# Patient Record
Sex: Female | Born: 1940 | Race: Black or African American | Hispanic: No | Smoking: Never smoker
Health system: Southern US, Community
[De-identification: ages and names within clinical notes are randomized; demographics above are authoritative.]

## PROBLEM LIST (undated history)

## (undated) DIAGNOSIS — Z9289 Personal history of other medical treatment: Secondary | ICD-10-CM

## (undated) DIAGNOSIS — M1990 Unspecified osteoarthritis, unspecified site: Secondary | ICD-10-CM

## (undated) DIAGNOSIS — K5792 Diverticulitis of intestine, part unspecified, without perforation or abscess without bleeding: Secondary | ICD-10-CM

## (undated) DIAGNOSIS — K922 Gastrointestinal hemorrhage, unspecified: Secondary | ICD-10-CM

## (undated) DIAGNOSIS — N289 Disorder of kidney and ureter, unspecified: Secondary | ICD-10-CM

## (undated) DIAGNOSIS — E119 Type 2 diabetes mellitus without complications: Secondary | ICD-10-CM

## (undated) DIAGNOSIS — B019 Varicella without complication: Secondary | ICD-10-CM

## (undated) DIAGNOSIS — I10 Essential (primary) hypertension: Secondary | ICD-10-CM

## (undated) DIAGNOSIS — E7800 Pure hypercholesterolemia, unspecified: Secondary | ICD-10-CM

## (undated) HISTORY — PX: EYE SURGERY: SHX253

## (undated) HISTORY — DX: Essential (primary) hypertension: I10

## (undated) HISTORY — DX: Pure hypercholesterolemia, unspecified: E78.00

## (undated) HISTORY — DX: Varicella without complication: B01.9

## (undated) HISTORY — PX: APPENDECTOMY: SHX54

## (undated) HISTORY — DX: Unspecified osteoarthritis, unspecified site: M19.90

## (undated) HISTORY — PX: THYROIDECTOMY, PARTIAL: SHX18

## (undated) HISTORY — DX: Personal history of other medical treatment: Z92.89

## (undated) HISTORY — PX: CHOLECYSTECTOMY: SHX55

## (undated) HISTORY — DX: Diverticulitis of intestine, part unspecified, without perforation or abscess without bleeding: K57.92

## (undated) HISTORY — PX: OTHER SURGICAL HISTORY: SHX169

## (undated) HISTORY — PX: ECTOPIC PREGNANCY SURGERY: SHX613

## (undated) HISTORY — DX: Type 2 diabetes mellitus without complications: E11.9

---

## 2013-09-28 ENCOUNTER — Ambulatory Visit (INDEPENDENT_AMBULATORY_CARE_PROVIDER_SITE_OTHER): Payer: Medicare Other | Admitting: Podiatry

## 2013-09-28 ENCOUNTER — Ambulatory Visit (INDEPENDENT_AMBULATORY_CARE_PROVIDER_SITE_OTHER): Payer: Medicare Other

## 2013-09-28 ENCOUNTER — Encounter: Payer: Self-pay | Admitting: Podiatry

## 2013-09-28 VITALS — BP 151/60 | HR 92 | Resp 18 | Ht 64.0 in | Wt 270.0 lb

## 2013-09-28 DIAGNOSIS — E1149 Type 2 diabetes mellitus with other diabetic neurological complication: Secondary | ICD-10-CM

## 2013-09-28 DIAGNOSIS — M79671 Pain in right foot: Secondary | ICD-10-CM

## 2013-09-28 DIAGNOSIS — M79609 Pain in unspecified limb: Secondary | ICD-10-CM

## 2013-09-28 DIAGNOSIS — B351 Tinea unguium: Secondary | ICD-10-CM

## 2013-09-28 DIAGNOSIS — M79672 Pain in left foot: Principal | ICD-10-CM

## 2013-09-28 DIAGNOSIS — Q6650 Congenital pes planus, unspecified foot: Secondary | ICD-10-CM

## 2013-09-28 NOTE — Patient Instructions (Signed)
Diabetes and Foot Care Diabetes may cause you to have problems because of poor blood supply (circulation) to your feet and legs. This may cause the skin on your feet to become thinner, break easier, and heal more slowly. Your skin may become dry, and the skin may peel and crack. You may also have nerve damage in your legs and feet causing decreased feeling in them. You may not notice minor injuries to your feet that could lead to infections or more serious problems. Taking care of your feet is one of the most important things you can do for yourself.  HOME CARE INSTRUCTIONS  Wear shoes at all times, even in the house. Do not go barefoot. Bare feet are easily injured.  Check your feet daily for blisters, cuts, and redness. If you cannot see the bottom of your feet, use a mirror or ask someone for help.  Wash your feet with warm water (do not use hot water) and mild soap. Then pat your feet and the areas between your toes until they are completely dry. Do not soak your feet as this can dry your skin.  Apply a moisturizing lotion or petroleum jelly (that does not contain alcohol and is unscented) to the skin on your feet and to dry, brittle toenails. Do not apply lotion between your toes.  Trim your toenails straight across. Do not dig under them or around the cuticle. File the edges of your nails with an emery board or nail file.  Do not cut corns or calluses or try to remove them with medicine.  Wear clean socks or stockings every day. Make sure they are not too tight. Do not wear knee-high stockings since they may decrease blood flow to your legs.  Wear shoes that fit properly and have enough cushioning. To break in new shoes, wear them for just a few hours a day. This prevents you from injuring your feet. Always look in your shoes before you put them on to be sure there are no objects inside.  Do not cross your legs. This may decrease the blood flow to your feet.  If you find a minor scrape,  cut, or break in the skin on your feet, keep it and the skin around it clean and dry. These areas may be cleansed with mild soap and water. Do not cleanse the area with peroxide, alcohol, or iodine.  When you remove an adhesive bandage, be sure not to damage the skin around it.  If you have a wound, look at it several times a day to make sure it is healing.  Do not use heating pads or hot water bottles. They may burn your skin. If you have lost feeling in your feet or legs, you may not know it is happening until it is too late.  Make sure your health care provider performs a complete foot exam at least annually or more often if you have foot problems. Report any cuts, sores, or bruises to your health care provider immediately. SEEK MEDICAL CARE IF:   You have an injury that is not healing.  You have cuts or breaks in the skin.  You have an ingrown nail.  You notice redness on your legs or feet.  You feel burning or tingling in your legs or feet.  You have pain or cramps in your legs and feet.  Your legs or feet are numb.  Your feet always feel cold. SEEK IMMEDIATE MEDICAL CARE IF:   There is increasing redness,   swelling, or pain in or around a wound.  There is a red line that goes up your leg.  Pus is coming from a wound.  You develop a fever or as directed by your health care provider.  You notice a bad smell coming from an ulcer or wound. Document Released: 06/01/2000 Document Revised: 02/04/2013 Document Reviewed: 11/11/2012 Exit[employer] Patient Information 2014 [employer].  

## 2013-09-28 NOTE — Progress Notes (Signed)
   Subjective:    Patient ID: Esmerelda Patin, female    DOB: [birth year], 73 y.o.   MRN: [id number]  HPI Comments: I moved here from [place] and i normally get my feet checked every three months.  I am diabetic, its been over 23 years. Last A1c was 9. Long thick toenails   Diabetes      Review of Systems  Endocrine:       Diabetes   All other systems reviewed and are negative.      Objective:   Physical Exam: I have reviewed her past medical history medications allergies surgeries social history and review of systems. Pulses are strongly palpable bilateral neurologic sensorium is decreased to the level midfoot per Semmes-Weinstein monofilament. Deep tendon reflexes are in non-elicitable. Muscle strength is 5 over 5 dorsiflexors plantar flexors inverters everters all intrinsic musculature is intact. Orthopedic evaluation demonstrates mild hammertoe deformity deformities bilateral but asymptomatic. Cutaneous evaluation Mr. Is supple while hydrated cutis thickening of her toenails possible onychomycosis mildly tender on palpation and debridement. Nails are thick yellow dystrophic onychomycotic painful palpation sharply incurvated.       Assessment & Plan:  Diabetes mellitus with peripheral neuropathy manifestations. Pain in limb secondary to onychomycosis.   Plan followup with her as necessary for debridement. Debridement of nails 1 through 5 bilateral covered service secondary to pain

## 2014-01-18 ENCOUNTER — Ambulatory Visit: Payer: Medicare Other | Admitting: Podiatry

## 2014-02-09 ENCOUNTER — Ambulatory Visit: Payer: Self-pay | Admitting: Family Medicine

## 2014-02-10 ENCOUNTER — Ambulatory Visit (INDEPENDENT_AMBULATORY_CARE_PROVIDER_SITE_OTHER): Payer: Medicare Other | Admitting: Podiatry

## 2014-02-10 DIAGNOSIS — M79609 Pain in unspecified limb: Secondary | ICD-10-CM

## 2014-02-10 DIAGNOSIS — B351 Tinea unguium: Secondary | ICD-10-CM

## 2014-02-10 DIAGNOSIS — M79676 Pain in unspecified toe(s): Secondary | ICD-10-CM

## 2014-02-10 NOTE — Progress Notes (Signed)
He presents today chief complaint of painful elongated toenails.  Objective: Nails are thick yellow dystrophic with mycotic pulses are palpable bilateral.  Assessment: Pain in limb secondary to onychomycosis 1 through 5 bilateral.  Plan: Debridement of nails 1 through 5 bilateral covered service secondary to pain.

## 2014-02-12 ENCOUNTER — Ambulatory Visit: Payer: Self-pay | Admitting: Family Medicine

## 2014-03-11 LAB — BASIC METABOLIC PANEL
BUN: 14 mg/dL (ref 4–21)
Creatinine: 1.1 mg/dL (ref 0.5–1.1)
GLUCOSE: 183 mg/dL
POTASSIUM: 4.5 mmol/L (ref 3.4–5.3)
Sodium: 140 mmol/L (ref 137–147)

## 2014-03-11 LAB — LIPID PANEL
Cholesterol: 165 mg/dL (ref 0–200)
HDL: 53 mg/dL (ref 35–70)
LDL Cholesterol: 76 mg/dL
Triglycerides: 182 mg/dL — AB (ref 40–160)

## 2014-03-11 LAB — HEPATIC FUNCTION PANEL
ALT: 36 U/L — AB (ref 7–35)
AST: 30 U/L (ref 13–35)
BILIRUBIN, TOTAL: 0.7 mg/dL

## 2014-03-11 LAB — HEMOGLOBIN A1C: Hgb A1c MFr Bld: 9.5 % — AB (ref 4.0–6.0)

## 2014-03-11 LAB — TSH: TSH: 2.74 u[IU]/mL (ref 0.41–5.90)

## 2014-03-25 ENCOUNTER — Encounter: Payer: Self-pay | Admitting: *Deleted

## 2014-04-08 ENCOUNTER — Encounter (INDEPENDENT_AMBULATORY_CARE_PROVIDER_SITE_OTHER): Payer: Self-pay

## 2014-04-08 ENCOUNTER — Ambulatory Visit (INDEPENDENT_AMBULATORY_CARE_PROVIDER_SITE_OTHER): Payer: Medicare Other | Admitting: Endocrinology

## 2014-04-08 ENCOUNTER — Encounter: Payer: Self-pay | Admitting: Endocrinology

## 2014-04-08 VITALS — BP 180/76 | HR 77 | Wt 271.5 lb

## 2014-04-08 DIAGNOSIS — I10 Essential (primary) hypertension: Secondary | ICD-10-CM | POA: Insufficient documentation

## 2014-04-08 DIAGNOSIS — N182 Chronic kidney disease, stage 2 (mild): Secondary | ICD-10-CM

## 2014-04-08 DIAGNOSIS — R600 Localized edema: Secondary | ICD-10-CM

## 2014-04-08 DIAGNOSIS — M545 Low back pain, unspecified: Secondary | ICD-10-CM

## 2014-04-08 DIAGNOSIS — N183 Chronic kidney disease, stage 3 unspecified: Secondary | ICD-10-CM

## 2014-04-08 DIAGNOSIS — E1142 Type 2 diabetes mellitus with diabetic polyneuropathy: Secondary | ICD-10-CM

## 2014-04-08 DIAGNOSIS — E1121 Type 2 diabetes mellitus with diabetic nephropathy: Secondary | ICD-10-CM | POA: Insufficient documentation

## 2014-04-08 DIAGNOSIS — IMO0002 Reserved for concepts with insufficient information to code with codable children: Secondary | ICD-10-CM | POA: Insufficient documentation

## 2014-04-08 DIAGNOSIS — E1129 Type 2 diabetes mellitus with other diabetic kidney complication: Secondary | ICD-10-CM | POA: Insufficient documentation

## 2014-04-08 DIAGNOSIS — E1165 Type 2 diabetes mellitus with hyperglycemia: Secondary | ICD-10-CM | POA: Insufficient documentation

## 2014-04-08 DIAGNOSIS — E1169 Type 2 diabetes mellitus with other specified complication: Secondary | ICD-10-CM | POA: Insufficient documentation

## 2014-04-08 HISTORY — DX: Chronic kidney disease, stage 2 (mild): N18.2

## 2014-04-08 HISTORY — DX: Localized edema: R60.0

## 2014-04-08 HISTORY — DX: Essential (primary) hypertension: I10

## 2014-04-08 HISTORY — DX: Low back pain, unspecified: M54.50

## 2014-04-08 HISTORY — DX: Morbid (severe) obesity due to excess calories: E66.01

## 2014-04-08 LAB — HM DIABETES FOOT EXAM: HM DIABETIC FOOT EXAM: ABNORMAL

## 2014-04-08 MED ORDER — INSULIN REGULAR HUMAN 100 UNIT/ML IJ SOLN: 15.0000 [IU] | Freq: Two times a day (BID) | INTRAMUSCULAR | Status: DC

## 2014-04-08 NOTE — Progress Notes (Signed)
Reason for visit-  Alexis Farmer is a 73 y.o.-year-old female, referred by her PCP,  Dr Enid Derry, for management of Type 2 diabetes, uncontrolled, with complications ( Stage 3 CKD, neuropathy) . Came to [place] from [place] 1 year ago. Lost her husband then and now here with grand daughter. Now starting to get better with mood.   HPI- Patient has been diagnosed with diabetes around age 76s. Recalls being initially on lifestyle modifications. Initially on oral medications, then on insulin atleast 20 years  Doesn't remember whether she didn't tolerate Metformin, of whether it was tried.   Pt is currently on a regimen of: - Novolin N 35 units twice daily ( BF and supper) - Novolin R 15 units BF and 15 units supper.   Last hemoglobin A1c was: Lab Results  Component Value Date   HGBA1C 9.5* 03/11/2014     Pt checks her sugars 3 a day . Uses Ar[employer]a glucometer. By recall they are:  PREMEAL Breakfast Lunch Dinner Bedtime Overall  Glucose range: 144-148- 120-117 [phone] 144   Mean/median:        POST-MEAL PC Breakfast PC Lunch PC Dinner  Glucose range: 195-155 232-273-200 169-189  Mean/median:       Hypoglycemia-  Some lows. Lowest sugar was 75 this Monday in the morning around 3am; she has hypoglycemia awareness at 70.   Dietary habits- eats 2-three times daily. Tries to limit carbs, sweetened beverages, sodas, desserts. Cut out sugars , sodas in the  Past month Exercise- now depression better and working more around the house.  Weight - steady recently Wt Readings from Last 3 Encounters:  04/08/14 271 lb 8 oz (123.152 kg)  09/28/13 270 lb (122.471 kg)    Diabetes Complications-  Nephropathy- Yes, Stage 3  CKD, last BUN/creatinine- GFR 59 Lab Results  Component Value Date   BUN 14 03/11/2014   CREATININE 1.1 03/11/2014   Urine MA ratio normal 03/11/14  Retinopathy- No, Last DEE in past year. Appt coming up Nov 2nd Neuropathy- has numbness and tingling in her  feet and hands. Known neuropathy. Sees podiatry.  Associated history - No CAD . No prior stroke. No hypothyroidism. her last TSH was  Lab Results  Component Value Date   TSH 2.74 03/11/2014    Hyperlipidemia-  her last set of lipids were- Currently on Crestor 10 mg . Tolerating well.   Lab Results  Component Value Date   CHOL 165 03/11/2014   HDL 53 03/11/2014   LDLCALC 76 03/11/2014   TRIG 182* 03/11/2014   Lab Results  Component Value Date   AST 30 03/11/2014   ALT 36* 03/11/2014     Blood Pressure/HTN- Patient's blood pressure is not  well controlled today on current regimen that includes ARB ( Losartan). She has a follow up appointment with PCP tomorrow for HTN management to assess recent medication changes. She reports that her Verapamil was titrated up recently.   Of note, she does have elevated calcium of 10.8, alb 4.6 on recent labs done with PCP ( Sept 2015)  Pt has FH of DM in mother, aunt and sister.  I have reviewed the patient's past medical history, family and social history, surgical history, medications and allergies.  Past Medical History  Diagnosis Date  . Diabetes mellitus without complication   . Hypertension   . High cholesterol    Past Surgical History  Procedure Laterality Date  . Ectopic pregnancy surgery    . Gallbladder     .  Thyroidectomy, partial    . Appendectomy     History   Social History  . Marital Status: Widowed    Spouse Name: N/A    Number of Children: N/A  . Years of Education: N/A   Occupational History  . Not on file.   Social History Main Topics  . Smoking status: Never Smoker   . Smokeless tobacco: Never Used  . Alcohol Use: No  . Drug Use: No  . Sexual Activity: Not on file   Other Topics Concern  . Not on file   Social History Narrative  . No narrative on file   Current Outpatient Prescriptions on File Prior to Visit  Medication Sig Dispense Refill  . CRESTOR 10 MG tablet 10 mg daily.       . hydrochlorothiazide  (HYDRODIURIL) 25 MG tablet       . Multiple Vitamins-Minerals (CENTRUM ADULTS PO) Take by mouth.      Marland Kitchen NOVOLIN N 100 UNIT/ML injection Inject 35 Units into the skin 2 (two) times daily.       . polyethylene glycol powder (GLYCOLAX/MIRALAX) powder       . Vitamin D, Ergocalciferol, (DRISDOL) 50000 UNITS CAPS capsule Take 50,000 Units by mouth every 7 (seven) days.       No current facility-administered medications on file prior to visit.   Allergies  Allergen Reactions  . Penicillins    Family History  Problem Relation Age of Onset  . Diabetes Mother   . Hypertension Mother   . Diabetes Sister   . Heart disease Sister   . Diabetes Other      Review of Systems: [x]  complains of  [  ] denies General:   [  ] Recent weight change [  ] Fatigue  [  ] Loss of appetite Eyes: [  ]  Vision Difficulty [  ]  Eye pain ENT: [  ]  Hearing difficulty [  ]  Difficulty Swallowing CVS: [  ] Chest pain [  ]  Palpitations/Irregular Heart beat [  ]  Shortness of breath lying flat [ x ] Swelling of legs Resp: [  ] Frequent Cough [  ] Shortness of Breath  [  ]  Wheezing GI: [  ] Heartburn  [  ] Nausea or Vomiting  [  ] Diarrhea [ x ] Constipation  [  ] Abdominal Pain GU: [ x ]  Polyuria  [ x ]  nocturia Bones/joints:  [ x ]  Muscle aches  [x  ] Joint Pain  [  ] Bone pain Skin/Hair/Nails: [  ]  Rash  [  ] New stretch marks [  ]  Itching [ x ] Hair loss [  ]  Excessive hair growth Reproduction: [  ] Low sexual desire , [  ]  Women: Menstrual cycle problems [  ]  Women: Breast Discharge [  ] Men: Difficulty with erections [  ]  Men: Enlarged Breasts CNS: [  ] Frequent Headaches [  ] Blurry vision [  ] Tremors [  ] Seizures [  ] Loss of consciousness [  ] Localized weakness Endocrine: [  ]  Excess thirst [  ]  Feeling excessively hot [  ]  Feeling excessively cold Heme: [  ]  Easy bruising [  ]  Enlarged glands or lumps in neck Allergy: [  ]  Food allergies [  ] Environmental allergies  PE: BP 180/76   Pulse 77  Wt 271  lb 8 oz (123.152 kg)  SpO2 98% Wt Readings from Last 3 Encounters:  04/08/14 271 lb 8 oz (123.152 kg)  09/28/13 270 lb (122.471 kg)   GENERAL: No acute distress, well developed HEENT:  Eye exam shows normal external appearance. Oral exam shows normal mucosa .  NECK:   Neck exam shows no lymphadenopathy. No Carotids bruits. Thyroid is not enlarged and no nodules felt.  + acanthosis nigricans LUNGS:         Chest is symmetrical. Lungs are clear to auscultation.Marland Kitchen   HEART:         Heart sounds:  S1 and S2 are normal. Systolic murmur + ABDOMEN:  No Distention present. Liver and spleen are not palpable. No other mass or tenderness present.  EXTREMITIES:     There is 1+ edema. 2+ DP pulses  NEUROLOGICAL:     Grossly intact.            Diabetic foot exam done with shoes and socks removed: patchy decrease in Normal Monofilament testing bilaterally, especially over left foot and lateral left foot. No deformities of toes.  Nails  Not dystrophic. Skin normal color. No open wounds. Dry skin.  MUSCULOSKELETAL:       There is no enlargement or gross deformity of the joints.  SKIN:       No rash  ASSESSMENT AND PLAN: Problem List Items Addressed This Visit     Cardiovascular and Mediastinum   Essential hypertension, benign     BP is above goal today. Recently medications were adjusted by her PCP, and she has follow up tomorrow for HTN management.     Relevant Medications      Verapamil HCl CR 300 MG CP24      losartan (COZAAR) 100 MG tablet     Nervous and Auditory   Diabetic polyneuropathy     Regular self foot care. Part of neuropathy may be related to her back pain. She sees podiatry on regular basis. Symptoms are tolerable.     Relevant Medications      losartan (COZAAR) 100 MG tablet      insulin regular (NOVOLIN R) 100 units/mL injection     Genitourinary   CKD (chronic kidney disease) stage 3, GFR 30-59 ml/min     Recent labs suggest Stage 3 CKD with GFR in the 50s.        Other   Diabetes mellitus type II, uncontrolled - Primary     Recent A1c is above target. Morning sugars are much better than sugars the rest of the day. Recent early morning hypoglycemia.   Discussed hypoglycemia recognition and treatment protocol for low sugars.  Handout given.   Discussed about treatment options including switch to basal/bolus regimen, or addition of GLP-1 , however the patient would like to continue with current regimen.   Adjusted NPH insulin to 37 units am and 32 units pm.  Continue current regular insulin at 15 units twice daily with BF and supper.   Check sugars 4 x daily. Asked her not to skip meals.   RTC 1 month.     Relevant Medications      losartan (COZAAR) 100 MG tablet      insulin regular (NOVOLIN R) 100 units/mL injection   Morbid obesity     Encouraged dietary modifications and exercise/walking as tolerated. She will try to work on this. Congratulated on recently giving up sodas.    Relevant Medications      insulin regular (NOVOLIN R) 100  units/mL injection        - Return to clinic in 1 mo with sugar log/meter.  Isahi Godwin PUSHKAR 04/08/2014 1:15 PM

## 2014-04-08 NOTE — Assessment & Plan Note (Signed)
Recent A1c is above target. Morning sugars are much better than sugars the rest of the day. Recent early morning hypoglycemia.   Discussed hypoglycemia recognition and treatment protocol for low sugars.  Handout given.   Discussed about treatment options including switch to basal/bolus regimen, or addition of GLP-1 , however the patient would like to continue with current regimen.   Adjusted NPH insulin to 37 units am and 32 units pm.  Continue current regular insulin at 15 units twice daily with BF and supper.   Check sugars 4 x daily. Asked her not to skip meals.   RTC 1 month.

## 2014-04-08 NOTE — Assessment & Plan Note (Signed)
Recent labs suggest Stage 3 CKD with GFR in the 50s.

## 2014-04-08 NOTE — Assessment & Plan Note (Signed)
Encouraged dietary modifications and exercise/walking as tolerated. She will try to work on this. Congratulated on recently giving up sodas.

## 2014-04-08 NOTE — Progress Notes (Signed)
Pre visit review using our clinic review tool, if applicable. No additional management support is needed unless otherwise documented below in the visit note. 

## 2014-04-08 NOTE — Patient Instructions (Signed)
Check sugars 4 x daily ( before each meal and at bedtime).  Record them in a log book and bring that/meter to next appointment.   Change Novolin N to 37 units in the morning and 32 units at supper.  Continue Novolin R at 15 units with breakfast and 15 units with supper.   Please come back for a follow-up appointment in 1 month.

## 2014-04-08 NOTE — Assessment & Plan Note (Signed)
BP is above goal today. Recently medications were adjusted by her PCP, and she has follow up tomorrow for HTN management.

## 2014-04-08 NOTE — Assessment & Plan Note (Signed)
Regular self foot care. Part of neuropathy may be related to her back pain. She sees podiatry on regular basis. Symptoms are tolerable.

## 2014-05-06 ENCOUNTER — Encounter (INDEPENDENT_AMBULATORY_CARE_PROVIDER_SITE_OTHER): Payer: Self-pay

## 2014-05-06 ENCOUNTER — Ambulatory Visit (INDEPENDENT_AMBULATORY_CARE_PROVIDER_SITE_OTHER): Payer: Medicare Other | Admitting: Endocrinology

## 2014-05-06 VITALS — BP 168/74 | HR 82 | Wt 268.5 lb

## 2014-05-06 DIAGNOSIS — IMO0002 Reserved for concepts with insufficient information to code with codable children: Secondary | ICD-10-CM

## 2014-05-06 DIAGNOSIS — E1165 Type 2 diabetes mellitus with hyperglycemia: Secondary | ICD-10-CM

## 2014-05-06 DIAGNOSIS — E1142 Type 2 diabetes mellitus with diabetic polyneuropathy: Secondary | ICD-10-CM

## 2014-05-06 DIAGNOSIS — I10 Essential (primary) hypertension: Secondary | ICD-10-CM

## 2014-05-06 NOTE — Assessment & Plan Note (Signed)
Recent A1c is above target. Morning sugars are much better than sugars the rest of the day.Decreased incidence of hypoglycemia after recent insulin adjustments.    At last visit, I had discussed about treatment options including switch to basal/bolus regimen, or addition of GLP-1 , however the patient would like to continue with current regimen.   Adjusted NPH insulin to 35 units am and 25 units pm.  Continue current regular insulin at 15 units with BF and 10 units with supper.  The days that she eats late- I have asked her to take her supper with her and give insulin at that time. She will work on consistency with her meal schedule.     Check sugars 3 x daily. Asked her not to skip meals.   RTC 2 month.

## 2014-05-06 NOTE — Progress Notes (Signed)
Pre visit review using our clinic review tool, if applicable. No additional management support is needed unless otherwise documented below in the visit note. 

## 2014-05-06 NOTE — Assessment & Plan Note (Signed)
Regular self foot care. Part of neuropathy may be related to her back pain. She sees podiatry on regular basis. Symptoms are tolerable.

## 2014-05-06 NOTE — Patient Instructions (Addendum)
Increase morning time NPH to 35 units and continue all other insulin doses   Check sugars 3 times daily ( fasting and premeal readings at alternating times, or at bedtime).  Record them in a sugar log and bring log and meter to next appointment.   Please come back for a follow-up appointment in 2 months

## 2014-05-06 NOTE — Progress Notes (Signed)
Reason for visit-  Alexis Farmer is a 73 y.o.-year-old female,  Here for management of Type 2 diabetes, uncontrolled, with complications ( Stage 3 CKD, neuropathy) . Came to [place] from [place] 1 year ago. Lost her husband then and now here with grand daughter. Now starting to get better with mood.  Last visit October 2015.  HPI- Patient has been diagnosed with diabetes around age 36s. Recalls being initially on lifestyle modifications. Initially on oral medications, then on insulin atleast 20 years  Doesn't remember whether she didn't tolerate Metformin, or whether it was tried.   Pt is currently on a regimen of: - Novolin N 33 units BF and 25 units supper ( self decreased from 32 at supper- about 1.5 weeks) - Novolin R 15 units BF and 10 units supper ( decreased from 15 units at supper 1.5 weeks ago).   Last hemoglobin A1c was: Lab Results  Component Value Date   HGBA1C 9.5* 03/11/2014     Pt checks her sugars 3 a day . Uses [employer] glucometer. By recall they are:  PREMEAL Breakfast Lunch Dinner Bedtime Overall  Glucose range: 140-188- few lows 66-70 140-199 119-200 105-240-one low of 70   Mean/median:        POST-MEAL PC Breakfast PC Lunch PC Dinner  Glucose range:     Mean/median:       Hypoglycemia-  Some lows in the morning following which she decreased the dose of her insulins.  she has hypoglycemia awareness at 84.   Dietary habits- eats 2-three times daily. Tries to limit carbs, sweetened beverages, sodas, desserts. Cut out sugars , sodas in the  Past months. Eating times are variable as helps out with grandkids.  Exercise- now depression better and working more around the house.  Weight - decreasing Wt Readings from Last 3 Encounters:  05/06/14 268 lb 8 oz (121.791 kg)  04/08/14 271 lb 8 oz (123.152 kg)  09/28/13 270 lb (122.471 kg)    Diabetes Complications-  Nephropathy- Yes, Stage 3  CKD, last BUN/creatinine- GFR 59 Lab Results  Component Value Date   BUN 14  03/11/2014   CREATININE 1.1 03/11/2014   Urine MA ratio normal 03/11/14  Retinopathy- No, Last DEE in Nov 2015 Neuropathy- has numbness and tingling in her feet and hands. Known neuropathy. Sees podiatry. Has appt Dec 1st Associated history - No CAD . No prior stroke. No hypothyroidism. her last TSH was  Lab Results  Component Value Date   TSH 2.74 03/11/2014    Hyperlipidemia-  her last set of lipids were- Currently on Crestor 10 mg . Tolerating well.   Lab Results  Component Value Date   CHOL 165 03/11/2014   HDL 53 03/11/2014   LDLCALC 76 03/11/2014   TRIG 182* 03/11/2014   Lab Results  Component Value Date   AST 30 03/11/2014   ALT 36* 03/11/2014     Blood Pressure/HTN- Patient's blood pressure is not  well controlled today on current regimen that includes ARB ( Losartan). PCP following. Ha appt at end of month.  She reports that her Verapamil was titrated up at last visit. Took medications about 20 minutes ago.   Of note, she does have elevated calcium of 10.8, alb 4.6 on recent labs done with PCP ( Sept 2015)   I have reviewed the patient's past medical history, medications and allergies.    Current Outpatient Prescriptions on File Prior to Visit  Medication Sig Dispense Refill  . CRESTOR 10 MG tablet 10  mg daily.     . hydrochlorothiazide (HYDRODIURIL) 25 MG tablet     . insulin regular (NOVOLIN R) 100 units/mL injection Inject 0.15 mLs (15 Units total) into the skin 2 (two) times daily before a meal. 10 mL 0  . losartan (COZAAR) 100 MG tablet Take 100 mg by mouth daily.    . Multiple Vitamins-Minerals (CENTRUM ADULTS PO) Take by mouth.    Marland Kitchen NOVOLIN N 100 UNIT/ML injection Inject 35 Units into the skin 2 (two) times daily.     . polyethylene glycol powder (GLYCOLAX/MIRALAX) powder     . Verapamil HCl CR 300 MG CP24 Take 1 capsule by mouth daily.    . Vitamin D, Ergocalciferol, (DRISDOL) 50000 UNITS CAPS capsule Take 50,000 Units by mouth every 7 (seven) days.      No current facility-administered medications on file prior to visit.   Allergies  Allergen Reactions  . Penicillins      Review of Systems- [ x ]  Complains of    [  ]  denies [  ] Recent weight change [  ]  Fatigue [  ] polydipsia [  ] polyuria [x  ]  nocturia [  ]  vision difficulty [  ] chest pain [  ] shortness of breath [ x ] leg swelling [  ] cough [  ] nausea/vomiting [  ] diarrhea [  ] constipation [  ] abdominal pain [  ]  tingling/numbness in extremities [  ]  concern with feet ( wounds/sores)   PE: BP 168/74 mmHg  Pulse 82  Wt 268 lb 8 oz (121.791 kg)  SpO2 97% Wt Readings from Last 3 Encounters:  05/06/14 268 lb 8 oz (121.791 kg)  04/08/14 271 lb 8 oz (123.152 kg)  09/28/13 270 lb (122.471 kg)   Exam: deferred  ASSESSMENT AND PLAN: Problem List Items Addressed This Visit      Cardiovascular and Mediastinum   Essential hypertension, benign    BP is above goal today. She just took her medications, and expect improvement as they start working. She follows up with PCP later this month for her BP. Urine MA ratio normal Sept 2015.          Nervous and Auditory   Diabetic polyneuropathy    Regular self foot care. Part of neuropathy may be related to her back pain. She sees podiatry on regular basis. Symptoms are tolerable.         Other   Diabetes mellitus type II, uncontrolled - Primary    Recent A1c is above target. Morning sugars are much better than sugars the rest of the day.Decreased incidence of hypoglycemia after recent insulin adjustments.    At last visit, I had discussed about treatment options including switch to basal/bolus regimen, or addition of GLP-1 , however the patient would like to continue with current regimen.   Adjusted NPH insulin to 35 units am and 25 units pm.  Continue current regular insulin at 15 units with BF and 10 units with supper.  The days that she eats late- I have asked her to take her supper with her and give insulin  at that time. She will work on consistency with her meal schedule.     Check sugars 3 x daily. Asked her not to skip meals.   RTC 2 month.            - Return to clinic in 2 mo with sugar log/meter.  Bynum Bellows [facility] 05/06/2014  10:43 AM

## 2014-05-06 NOTE — Assessment & Plan Note (Signed)
BP is above goal today. She just took her medications, and expect improvement as they start working. She follows up with PCP later this month for her BP. Urine MA ratio normal Sept 2015.

## 2014-05-12 ENCOUNTER — Ambulatory Visit: Payer: Medicare Other | Admitting: Podiatry

## 2014-05-14 ENCOUNTER — Ambulatory Visit: Payer: Self-pay | Admitting: Family Medicine

## 2014-05-17 ENCOUNTER — Ambulatory Visit: Payer: Medicare Other | Admitting: Podiatry

## 2014-05-19 ENCOUNTER — Ambulatory Visit: Payer: Medicare Other | Admitting: Podiatry

## 2014-05-24 ENCOUNTER — Ambulatory Visit (INDEPENDENT_AMBULATORY_CARE_PROVIDER_SITE_OTHER): Payer: Medicare Other | Admitting: Podiatry

## 2014-05-24 DIAGNOSIS — B351 Tinea unguium: Secondary | ICD-10-CM

## 2014-05-24 DIAGNOSIS — M79676 Pain in unspecified toe(s): Secondary | ICD-10-CM

## 2014-05-24 DIAGNOSIS — IMO0002 Reserved for concepts with insufficient information to code with codable children: Secondary | ICD-10-CM

## 2014-05-24 DIAGNOSIS — E1165 Type 2 diabetes mellitus with hyperglycemia: Secondary | ICD-10-CM

## 2014-05-24 NOTE — Progress Notes (Signed)
He presents today chief complaint of painful elongated toenails.  Objective: Nails are thick yellow dystrophic with mycotic pulses are palpable bilateral.  Assessment: Pain in limb secondary to onychomycosis 1 through 5 bilateral.  Plan: Debridement of nails 1 through 5 bilateral covered service secondary to pain.

## 2014-07-06 ENCOUNTER — Ambulatory Visit (INDEPENDENT_AMBULATORY_CARE_PROVIDER_SITE_OTHER): Payer: Medicare Other | Admitting: Endocrinology

## 2014-07-06 ENCOUNTER — Encounter: Payer: Self-pay | Admitting: Endocrinology

## 2014-07-06 VITALS — BP 138/66 | HR 68 | Ht 64.0 in | Wt 268.2 lb

## 2014-07-06 DIAGNOSIS — E1165 Type 2 diabetes mellitus with hyperglycemia: Secondary | ICD-10-CM

## 2014-07-06 DIAGNOSIS — I10 Essential (primary) hypertension: Secondary | ICD-10-CM

## 2014-07-06 DIAGNOSIS — N183 Chronic kidney disease, stage 3 unspecified: Secondary | ICD-10-CM

## 2014-07-06 DIAGNOSIS — IMO0002 Reserved for concepts with insufficient information to code with codable children: Secondary | ICD-10-CM

## 2014-07-06 NOTE — Assessment & Plan Note (Signed)
BP is at goal today after last medication adjustments. Urine MA ratio normal Sept 2015.

## 2014-07-06 NOTE — Progress Notes (Signed)
Pre visit review using our clinic review tool, if applicable. No additional management support is needed unless otherwise documented below in the visit note. 

## 2014-07-06 NOTE — Assessment & Plan Note (Signed)
Recent A1c is above target by report. Obtain last set of labs done for A1c with PCP.  Decreased incidence of hypoglycemia after recent insulin adjustments. Patient reports that she frequently needs to eat sugars during lunch time and night time inspite of these insulin changes, that she has made herself.   We discussed about relative hypoglycemia and that given her higher A1c she is probably used to higher sugars. We discussed that she should treat her sugars only if they are below 100.  Discussed hypoglycemia recognition and treatment protocol- rule of "15".  Discussed that she should not make her own insulin adjustments going forward. She is aware to notify me if she continues to have low sugars inspite of insulin adjustments made today.   Adjusted NPH insulin to 25 units am and 16 units pm.  Change current regular insulin at 5 units with salad BF( o/w 8 units with regular meal) and 5 units with supper.  Asked her to work on consistency of meal times.     Check sugars 3 x daily. Asked her not to skip meals.   RTC 6 weeks

## 2014-07-06 NOTE — Progress Notes (Signed)
Reason for visit-  Alexis Farmer is a 74 y.o.-year-old female,  Here for management of Type 2 diabetes, uncontrolled, with complications ( Stage 3 CKD, neuropathy) . Came to [place] from [place] 1 year ago. Lost her husband then and now here with grand daughter. Now starting to get better with mood.  Last visit Nov 2015.  HPI- Patient has been diagnosed with diabetes around age 19s. Recalls being initially on lifestyle modifications. Initially on oral medications, then on insulin atleast 20 years  *Doesn't remember whether she didn't tolerate Metformin, or whether it was tried.   Pt is currently on a regimen of: - Novolin N 35 units BF and 25 units supper -->self adjusted to 25 units am, 20 units pm about 3 weeks - Novolin R 15 units BF and 10 units supper --> self adjusted to 10 units am and 5 units supper.   Last hemoglobin A1c was: Lab Results  Component Value Date   HGBA1C 9.5* 03/11/2014   Reports that had recent labs done with PCP in Dec 2015 - A1c 9.2%   Pt checks her sugars 3 a day . Uses [employer] glucometer. By log they are: She has made insulin adjustments about 3 weeks ago, after which she reports that her sugars are around 130 in the morning. Log is from dec 2015. Reports that she is eating more sugar to keep up her sugars at lunch time and at dinner time.   PREMEAL Breakfast Lunch Dinner Bedtime Overall  Glucose range: 69-160 130-190 139-200    Mean/median:        POST-MEAL PC Breakfast PC Lunch PC Dinner  Glucose range:     Mean/median:       Hypoglycemia-  Some lows in the morning and at night time following which she decreased the dose of her insulins.  she has hypoglycemia awareness at 130- treating relative lower sugars around 120-130 with sugar intake following which her sugars are high per report.  Reports getting symptoms of numbness when hers sugars are around this range.   Dietary habits- eats 2-three times daily. Tries to limit carbs, sweetened beverages, sodas,  desserts. Cut out sugars , sodas in the  Past months. Eating times are variable as helps out with grandkids.  Exercise- now depression better and working more around the house.  Weight - stable  Wt Readings from Last 3 Encounters:  07/06/14 268 lb 4 oz (121.677 kg)  05/06/14 268 lb 8 oz (121.791 kg)  04/08/14 271 lb 8 oz (123.152 kg)    Diabetes Complications-  Nephropathy- Yes, Stage 3  CKD, last BUN/creatinine- GFR 59 Lab Results  Component Value Date   BUN 14 03/11/2014   CREATININE 1.1 03/11/2014   No results found for: GFR   Urine MA ratio normal 03/11/14  Retinopathy- No, Last DEE in Nov 2015 Neuropathy- has numbness and tingling in her feet and hands. Known neuropathy. Sees podiatry. Has appt Dec 1st Associated history - No CAD . No prior stroke. No hypothyroidism. her last TSH was  Lab Results  Component Value Date   TSH 2.74 03/11/2014    Hyperlipidemia-  her last set of lipids were- Currently on Crestor 10 mg . Tolerating well.   Lab Results  Component Value Date   CHOL 165 03/11/2014   HDL 53 03/11/2014   LDLCALC 76 03/11/2014   TRIG 182* 03/11/2014   Lab Results  Component Value Date   AST 30 03/11/2014   ALT 36* 03/11/2014     Blood  Pressure/HTN- Patient's blood pressure is  well controlled today on current regimen that includes ARB ( Losartan). PCP following. Now on Torsemide by nephrology as well for leg edema.    Of note, she does have elevated calcium of 10.8, alb 4.6 on recent labs done with PCP ( Sept 2015). Had another set of labs done with PCP Dec 2015.    I have reviewed the patient's past medical history, medications and allergies.    Current Outpatient Prescriptions on File Prior to Visit  Medication Sig Dispense Refill  . CRESTOR 10 MG tablet 10 mg daily.     . insulin regular (NOVOLIN R) 100 units/mL injection Inject 0.15 mLs (15 Units total) into the skin 2 (two) times daily before a meal. 10 mL 0  . losartan (COZAAR) 100 MG tablet Take  100 mg by mouth daily.    . Multiple Vitamins-Minerals (CENTRUM ADULTS PO) Take by mouth.    Marland Kitchen NOVOLIN N 100 UNIT/ML injection Inject 35 Units into the skin 2 (two) times daily.     . polyethylene glycol powder (GLYCOLAX/MIRALAX) powder      No current facility-administered medications on file prior to visit.   Allergies  Allergen Reactions  . Penicillins      Review of Systems- [ x ]  Complains of    [  ]  denies [  ] Recent weight change [  ]  Fatigue [  ] polydipsia [  ] polyuria [  ]  nocturia [  ]  vision difficulty [  ] chest pain [  ] shortness of breath [ x ] leg swelling [  ] cough [  ] nausea/vomiting [  ] diarrhea [  ] constipation [  ] abdominal pain [  ]  tingling/numbness in extremities [  ]  concern with feet ( wounds/sores)    PE: BP 138/66 mmHg  Pulse 68  Ht 5\' 4"  (1.626 m)  Wt 268 lb 4 oz (121.677 kg)  BMI 46.02 kg/m2  SpO2 97% Wt Readings from Last 3 Encounters:  07/06/14 268 lb 4 oz (121.677 kg)  05/06/14 268 lb 8 oz (121.791 kg)  04/08/14 271 lb 8 oz (123.152 kg)   Exam: deferred  ASSESSMENT AND PLAN: Problem List Items Addressed This Visit      Cardiovascular and Mediastinum   Essential hypertension, benign    BP is at goal today after last medication adjustments. Urine MA ratio normal Sept 2015.            Relevant Medications   hydrALAZINE (APRESOLINE) 50 MG tablet   verapamil (CALAN-SR) 240 MG CR tablet   cloNIDine (CATAPRES) 0.2 MG tablet   chlorthalidone (HYGROTON) 50 MG tablet   torsemide (DEMADEX) 20 MG tablet     Genitourinary   CKD (chronic kidney disease) stage 3, GFR 30-59 ml/min    Recent labs suggest Stage 3 CKD with GFR in the 50s. She follows with nephrology- Dr Murlean Iba.           Other   Diabetes mellitus type II, uncontrolled - Primary    Recent A1c is above target by report. Obtain last set of labs done for A1c with PCP.  Decreased incidence of hypoglycemia after recent insulin adjustments. Patient reports  that she frequently needs to eat sugars during lunch time and night time inspite of these insulin changes, that she has made herself.   We discussed about relative hypoglycemia and that given her higher A1c she is probably used to  higher sugars. We discussed that she should treat her sugars only if they are below 100.  Discussed hypoglycemia recognition and treatment protocol- rule of "15".  Discussed that she should not make her own insulin adjustments going forward. She is aware to notify me if she continues to have low sugars inspite of insulin adjustments made today.   Adjusted NPH insulin to 25 units am and 16 units pm.  Change current regular insulin at 5 units with salad BF( o/w 8 units with regular meal) and 5 units with supper.  Asked her to work on consistency of meal times.     Check sugars 3 x daily. Asked her not to skip meals.   RTC 6 weeks               - Return to clinic in 6 weeks with sugar log/meter.  Marcos Peloso [facility] 07/06/2014 9:50 AM

## 2014-07-06 NOTE — Assessment & Plan Note (Signed)
Recent labs suggest Stage 3 CKD with GFR in the 50s. She follows with nephrology- Dr Murlean Iba.

## 2014-07-06 NOTE — Patient Instructions (Addendum)
Check sugars 3 times daily ( fasting and premeal readings at alternating times, or at bedtime).  Record them in a sugar log and bring log and meter to next appointment.   Change NPH insulin to 25 units and 16 units in the evening.  Change regular insulin to 5 units in the morning if having salads for brunch and 8 units in the morning if having regular meal for brunch  Continue regular insulin at 5 units at supper.   Obtain recent labs from PCP.  Please come back for a follow-up appointment in 6 weeks Notify me if continue to have lows. Dont make insulin adjustments on your own.

## 2014-08-13 ENCOUNTER — Inpatient Hospital Stay: Payer: Self-pay | Admitting: Internal Medicine

## 2014-08-13 ENCOUNTER — Other Ambulatory Visit: Payer: Self-pay | Admitting: Physician Assistant

## 2014-08-13 DIAGNOSIS — I517 Cardiomegaly: Secondary | ICD-10-CM | POA: Diagnosis not present

## 2014-08-13 DIAGNOSIS — R7989 Other specified abnormal findings of blood chemistry: Secondary | ICD-10-CM | POA: Diagnosis not present

## 2014-08-13 DIAGNOSIS — I10 Essential (primary) hypertension: Secondary | ICD-10-CM | POA: Diagnosis not present

## 2014-08-13 DIAGNOSIS — R002 Palpitations: Secondary | ICD-10-CM | POA: Diagnosis not present

## 2014-08-13 DIAGNOSIS — E785 Hyperlipidemia, unspecified: Secondary | ICD-10-CM | POA: Diagnosis not present

## 2014-08-14 DIAGNOSIS — E785 Hyperlipidemia, unspecified: Secondary | ICD-10-CM | POA: Diagnosis not present

## 2014-08-14 DIAGNOSIS — I10 Essential (primary) hypertension: Secondary | ICD-10-CM | POA: Diagnosis not present

## 2014-08-14 DIAGNOSIS — R002 Palpitations: Secondary | ICD-10-CM | POA: Diagnosis not present

## 2014-08-14 DIAGNOSIS — R7989 Other specified abnormal findings of blood chemistry: Secondary | ICD-10-CM | POA: Diagnosis not present

## 2014-08-16 NOTE — Telephone Encounter (Signed)
This encounter was created in error - please disregard.

## 2014-08-19 ENCOUNTER — Ambulatory Visit: Payer: Commercial Indemnity | Admitting: Endocrinology

## 2014-08-23 ENCOUNTER — Other Ambulatory Visit: Payer: Medicare Other

## 2014-08-26 ENCOUNTER — Telehealth: Payer: Self-pay

## 2014-08-26 ENCOUNTER — Ambulatory Visit (INDEPENDENT_AMBULATORY_CARE_PROVIDER_SITE_OTHER): Payer: Medicare Other | Admitting: Cardiovascular Disease

## 2014-08-26 ENCOUNTER — Other Ambulatory Visit: Payer: Self-pay | Admitting: *Deleted

## 2014-08-26 ENCOUNTER — Encounter (INDEPENDENT_AMBULATORY_CARE_PROVIDER_SITE_OTHER): Payer: Self-pay

## 2014-08-26 ENCOUNTER — Ambulatory Visit: Payer: Self-pay | Admitting: Orthopedic Surgery

## 2014-08-26 ENCOUNTER — Encounter: Payer: Self-pay | Admitting: Cardiovascular Disease

## 2014-08-26 VITALS — BP 130/68 | HR 67 | Ht 64.0 in | Wt 273.0 lb

## 2014-08-26 DIAGNOSIS — I517 Cardiomegaly: Secondary | ICD-10-CM

## 2014-08-26 DIAGNOSIS — E1142 Type 2 diabetes mellitus with diabetic polyneuropathy: Secondary | ICD-10-CM

## 2014-08-26 DIAGNOSIS — I10 Essential (primary) hypertension: Secondary | ICD-10-CM

## 2014-08-26 DIAGNOSIS — E782 Mixed hyperlipidemia: Secondary | ICD-10-CM | POA: Insufficient documentation

## 2014-08-26 DIAGNOSIS — R600 Localized edema: Secondary | ICD-10-CM

## 2014-08-26 DIAGNOSIS — E785 Hyperlipidemia, unspecified: Secondary | ICD-10-CM | POA: Insufficient documentation

## 2014-08-26 DIAGNOSIS — N183 Chronic kidney disease, stage 3 unspecified: Secondary | ICD-10-CM

## 2014-08-26 MED ORDER — CLONIDINE HCL 0.2 MG PO TABS: 0.2000 mg | ORAL_TABLET | Freq: Two times a day (BID) | ORAL | Status: DC

## 2014-08-26 MED ORDER — TORSEMIDE 20 MG PO TABS: 20.0000 mg | ORAL_TABLET | Freq: Every day | ORAL | Status: DC

## 2014-08-26 MED ORDER — VERAPAMIL HCL ER 240 MG PO TBCR: 240.0000 mg | EXTENDED_RELEASE_TABLET | Freq: Two times a day (BID) | ORAL | Status: DC

## 2014-08-26 MED ORDER — LOSARTAN POTASSIUM 100 MG PO TABS: 100.0000 mg | ORAL_TABLET | Freq: Every day | ORAL | Status: DC

## 2014-08-26 MED ORDER — ROSUVASTATIN CALCIUM 10 MG PO TABS: 10.0000 mg | ORAL_TABLET | Freq: Every day | ORAL | Status: DC

## 2014-08-26 MED ORDER — METOPROLOL TARTRATE 50 MG PO TABS: 50.0000 mg | ORAL_TABLET | Freq: Two times a day (BID) | ORAL | Status: DC

## 2014-08-26 MED ORDER — HYDRALAZINE HCL 50 MG PO TABS: 50.0000 mg | ORAL_TABLET | Freq: Two times a day (BID) | ORAL | Status: DC

## 2014-08-26 NOTE — Progress Notes (Signed)
Patient ID: Alexis Farmer, female    DOB: [birth year], 74 y.o.   MRN: [id number]  HPI Comments: Alexis Farmer is a 74 year old woman with history of hypertension, hyperlipidemia, moderate LVH with recent admission to the hospital 08/13/2014 with discharge February 27 with symptoms of chest pain, malignant hypertension, possible sleep apnea, insulin-dependent diabetes, chronic kidney disease, anxiety. She presents to establish care in the [place] office after recent discharge. Notes indicate previous history of GI bleeding but recent colonoscopy which was normal by her report  In general she reports that she is feeling well. Blood pressure the office today is well controlled. On arrival to the hospital she reported having headaches and palpitations especially at nighttime. Intermittent right arm pain, right flank pain. No recent chest pain In the hospital she was seen by nephrology, Dr. Candiss Farmer. Emphasis placed on blood pressure control for systolic more than [id number] Torsemide was decreased to every other day, she was continued on verapamil, clonidine, hydralazine, losartan Xanax for anxiety. There was mention of needing an SSRI but this was not started 4 her nighttime palpitations, sleep study was recommended  Echocardiogram 08/13/2014 showed normal ejection fraction, normal RV size and function, moderate LVH  EKG on today's visit shows normal sinus rhythm with rate 67 bpm, poor R-wave progression to the anterior precordial leads, left axis deviation  Other past medical history Renal artery duplex showing no blockage of the mid to distal vessels bilaterally, challenging study    Allergies  Allergen Reactions  . Penicillins     Outpatient Encounter Prescriptions as of 08/26/2014  Medication Sig  . ALPRAZolam (XANAX) 0.5 MG tablet Take 0.5 mg by mouth as needed for anxiety.   . Cholecalciferol (VITAMIN D-3) 1000 UNITS CAPS Take 1,000 Units by mouth daily.  . insulin regular (NOVOLIN R) 100  units/mL injection Inject 0.15 mLs (15 Units total) into the skin 2 (two) times daily before a meal.  . Multiple Vitamins-Minerals (CENTRUM ADULTS PO) Take by mouth.  Marland Kitchen NOVOLIN N 100 UNIT/ML injection Inject 35 Units into the skin 2 (two) times daily.   Marland Kitchen omeprazole (PRILOSEC) 20 MG capsule Take 20 mg by mouth daily.   . polyethylene glycol powder (GLYCOLAX/MIRALAX) powder   . SUPREP BOWEL PREP SOLN   . vitamin B-12 (CYANOCOBALAMIN) 1000 MCG tablet Take 1,000 mcg by mouth daily.  . vitamin E 400 UNIT capsule Take 400 Units by mouth daily.  . [DISCONTINUED] cloNIDine (CATAPRES) 0.2 MG tablet Take 0.2 mg by mouth 2 (two) times daily.   . [DISCONTINUED] cloNIDine (CATAPRES) 0.2 MG tablet Take 1 tablet (0.2 mg total) by mouth 2 (two) times daily.  . [DISCONTINUED] CRESTOR 10 MG tablet 10 mg daily.   . [DISCONTINUED] hydrALAZINE (APRESOLINE) 50 MG tablet Take 50 mg by mouth 2 (two) times daily.   . [DISCONTINUED] hydrALAZINE (APRESOLINE) 50 MG tablet Take 1 tablet (50 mg total) by mouth 2 (two) times daily.  . [DISCONTINUED] losartan (COZAAR) 100 MG tablet Take 100 mg by mouth daily.  . [DISCONTINUED] losartan (COZAAR) 100 MG tablet Take 1 tablet (100 mg total) by mouth daily.  . [DISCONTINUED] metoprolol (LOPRESSOR) 50 MG tablet Take 50 mg by mouth 2 (two) times daily.   . [DISCONTINUED] metoprolol (LOPRESSOR) 50 MG tablet Take 1 tablet (50 mg total) by mouth 2 (two) times daily.  . [DISCONTINUED] rosuvastatin (CRESTOR) 10 MG tablet Take 1 tablet (10 mg total) by mouth daily.  . [DISCONTINUED] torsemide (DEMADEX) 20 MG tablet Take 20 mg by mouth daily.  . [  DISCONTINUED] torsemide (DEMADEX) 20 MG tablet Take 1 tablet (20 mg total) by mouth daily.  . [DISCONTINUED] verapamil (CALAN-SR) 240 MG CR tablet Take 240 mg by mouth 2 (two) times daily.   . [DISCONTINUED] verapamil (CALAN-SR) 240 MG CR tablet Take 1 tablet (240 mg total) by mouth 2 (two) times daily.  . [DISCONTINUED] chlorthalidone (HYGROTON)  50 MG tablet Take 50 mg by mouth daily.  . [DISCONTINUED] hydrocortisone (ANUSOL-HC) 25 MG suppository Place 25 mg rectally.     Past Medical History  Diagnosis Date  . Diabetes mellitus without complication   . Hypertension   . High cholesterol     Past Surgical History  Procedure Laterality Date  . Ectopic pregnancy surgery    . Gallbladder     . Thyroidectomy, partial    . Appendectomy      Social History  reports that she has never smoked. She has never used smokeless tobacco. She reports that she does not drink alcohol or use illicit drugs.  Family History family history includes Diabetes in her mother, other, and sister; Heart disease in her sister; Hypertension in her mother.   Review of Systems  Constitutional: Negative.   Respiratory: Negative.   Cardiovascular: Negative.   Gastrointestinal: Negative.   Musculoskeletal: Positive for gait problem.  Skin: Negative.   Neurological: Negative.   Hematological: Negative.   Psychiatric/Behavioral: Negative.   All other systems reviewed and are negative.   BP 130/68 mmHg  Pulse 67  Ht 5\' 4"  (1.626 m)  Wt 273 lb (123.832 kg)  BMI 46.84 kg/m2  Physical Exam  Constitutional: She is oriented to person, place, and time. She appears well-developed and well-nourished.  Obese  HENT:  Head: Normocephalic.  Nose: Nose normal.  Mouth/Throat: Oropharynx is clear and moist.  Eyes: Conjunctivae are normal. Pupils are equal, round, and reactive to light.  Neck: Normal range of motion. Neck supple. No JVD present.  Cardiovascular: Normal rate, regular rhythm, S1 normal, S2 normal and intact distal pulses.  Exam reveals no gallop and no friction rub.   Murmur heard.  Systolic murmur is present with a grade of 2/6  Pulmonary/Chest: Effort normal and breath sounds normal. No respiratory distress. She has no wheezes. She has no rales. She exhibits no tenderness.  Abdominal: Soft. Bowel sounds are normal. She exhibits no  distension. There is no tenderness.  Musculoskeletal: Normal range of motion. She exhibits no edema or tenderness.  Lymphadenopathy:    She has no cervical adenopathy.  Neurological: She is alert and oriented to person, place, and time. Coordination normal.  Skin: Skin is warm and dry. No rash noted. No erythema.  Psychiatric: She has a normal mood and affect. Her behavior is normal. Judgment and thought content normal.    Assessment and Plan  Nursing note and vitals reviewed.

## 2014-08-26 NOTE — Assessment & Plan Note (Signed)
We have encouraged continued exercise, careful diet management in an effort to lose weight. 

## 2014-08-26 NOTE — Telephone Encounter (Signed)
All cardiac medications have been refilled with 90 day sent to local pharmacy.

## 2014-08-26 NOTE — Assessment & Plan Note (Signed)
We have stressed the importance of weight loss

## 2014-08-26 NOTE — Assessment & Plan Note (Signed)
Torsemide dosing decreased in the hospital. Suspect mild dehydration Prior creatinine within normal range when checked by primary care February 2016

## 2014-08-26 NOTE — Assessment & Plan Note (Signed)
Blood pressure much better on today's visit. We'll continue current medication regimen

## 2014-08-26 NOTE — Patient Instructions (Signed)
You are doing well. No medication changes were made.  Please call us if you have new issues that need to be addressed before your next appt.  Your physician wants you to follow-up in: 6 months.  You will receive a reminder letter in the mail two months in advance. If you don't receive a letter, please call our office to schedule the follow-up appointment.   

## 2014-08-26 NOTE — Telephone Encounter (Signed)
Pt states she needs all her cardiac medication refilled, but is not sure of the names. 90 day refill

## 2014-08-26 NOTE — Assessment & Plan Note (Signed)
Cholesterol is at goal on the current lipid regimen. No changes to the medications were made.  

## 2014-08-26 NOTE — Assessment & Plan Note (Signed)
Minimal edema on today's visit. Likely from venous insufficiency, exacerbated by obesity

## 2014-08-28 ENCOUNTER — Emergency Department: Payer: Self-pay | Admitting: Emergency Medicine

## 2014-09-10 ENCOUNTER — Ambulatory Visit: Payer: Self-pay | Admitting: Gastroenterology

## 2014-09-10 LAB — HM COLONOSCOPY

## 2014-09-20 ENCOUNTER — Other Ambulatory Visit: Payer: Medicare Other

## 2014-09-21 ENCOUNTER — Ambulatory Visit: Admit: 2014-09-21 | Disposition: A | Discharge status: Home / Self Care | Payer: Self-pay | Admitting: Internal Medicine

## 2014-10-17 NOTE — H&P (Signed)
PATIENT NAME:  Alexis Farmer, Alexis Farmer MR#:  [id number] DATE OF BIRTH:  [birth year]  DATE OF ADMISSION:  08/13/2014  REFERRING PHYSICIAN: Lurline Hare, MD   PRIMARY CARE PHYSICIAN: Enid Derry, MD   ADMITTING DIAGNOSIS: Chest pain.   HISTORY OF PRESENT ILLNESS: This 74 year old African American female, who presents to the Emergency Department via EMS after complaining of chest pain. The patient's granddaughter, with whom she lives, states that this has been patient's complaint of the last few nights. She says that the pain has usually resolved and the patient stopped complaining after drinking some ice water and taking deep breaths. Tonight however, the patient's anxiety level regarding her headache and palpitations that she originally stated were associated with chest pain prompted the granddaughter to call EMS. Upon arrival of emergency medical services, the patient denied any chest pain, but admitted to some nausea. En route to the Emergency Department, she was given 325 mg of chewable aspirin. In the Emergency Department, she was found to have a systolic blood pressure more than 220, but denied chest pain and upon arrival. She refused nitroglycerin or morphine, but stated that she needed something to calm down once she was told that she may have had a heart attack. All of her symptoms were relieved with some IV Ativan. The patient's initial troponin was mildly elevated at 0.12 and I suggested that she would benefit from a heparin drip. The patient refused this intervention, as she reports a history of a GI bleed. Thankfully, the patient's EKG  did not show any signs of ischemia and I have informed of this risk. Due to her history of chest pain, the Emergency Department called for admission.   REVIEW OF SYSTEMS:  CONSTITUTIONAL: The patient denies fever, but admits to some generalized weakness.  EYES: Denies blurred vision or inflammation.  EARS, NOSE AND THROAT: Denies tinnitus, but admits to headache.   RESPIRATORY: Denies shortness of breath or cough.  CARDIOVASCULAR: Currently denies chest pain, although the patient reported some discomfort before. She does complain of some palpitations.  GASTROINTESTINAL: Admits to nausea, which is now resolved, but denies vomiting, diarrhea, or abdominal pain.  GENITOURINARY: Denies dysuria, increased frequency, or hesitancy of urination. HEMATOLOGIC AND LYMPHATIC: Denies easy bruising or bleeding.  ENDOCRINE: Denies polyuria or polydipsia, but does admit to some episodes of relative hypoglycemia (the low blood sugar low by the patient's home meter was reportedly 70 prior to the arrival to the Emergency Department).  INTEGUMENT: Denies rashes or lesions.  MUSCULOSKELETAL: Denies myalgias or arthralgias.  NEUROLOGIC: Denies numbness in extremities or dysarthria.  PSYCHIATRIC: Denies depression or suicidal ideation.   PAST MEDICAL HISTORY: Diabetes type 2, chronic kidney disease, hypertension and hyperlipidemia.   PAST SURGICAL HISTORY: Cholecystectomy, partial thyroidectomy, ectopic pregnancy removal.   SOCIAL HISTORY: The patient lives with her granddaughter. She does not smoke, drink, or do any drugs. She has been widowed for 2 years.   FAMILY HISTORY: Her mother is deceased of complications of diabetes.   MEDICATIONS:  1. Chlorthalidone 50 mg 1 tablet p.o. daily.  2. Clonidine 0.2 mg 1 tab p.o. b.i.d.  3. Crestor 10 mg 1 tablet p.o. daily.  4. Hydralazine 50 mg 1 tablet p.o. 4 times a day.  5. Losartan 100 mg 1 tablet p.o. daily.  6. Novolin 20 units subcutaneously before supper.  7. Novolin 25 units subcutaneously every morning.  8. Novolin R 5 units subcutaneously 2 times a day.  9. Omeprazole 20 mg 1 tablet p.o. 30 minutes prior to breakfast.  10. Torsemide 20 mg 1 tablet p.o. daily.  11. Verapamil 240 mg extended release tablet 1 tablet p.o. b.i.d.   ALLERGIES: PENICILLIN.   PERTINENT LABORATORY RESULTS AND RADIOGRAPHIC FINDINGS: Serum  glucose is 196. BUN is 41, creatinine is 2.21, serum sodium is 134, potassium is 3.6, chloride 93, bicarbonate 32, calcium is 8.9. Troponin is 0.12, CK-MB is 7.8. White blood cell count 7.2, hemoglobin 12.4, hematocrit 38.1, platelet count 227,000, MCV is 88. INR 0.9.   Chest x-ray shows mild cardiac enlargement with no evidence of active cardiopulmonary disease.   PHYSICAL EXAMINATION:  VITAL SIGNS: Temperature is 98.3, pulse 93, respirations 18, blood pressure 146/107, pulse oximetry is 98% on room air.  GENERAL: The patient is alert and oriented and is sometimes tearful and shaky, but in no respiratory or cardiac distress.  HEENT: Normocephalic, atraumatic. Pupils equal, round, and reactive to light and accommodation. Extraocular movements are intact. Mucous membranes are moist.  NECK: Trachea is midline. No adenopathy. There is a well-healed midline scar. The thyroid is asymmetric and consistent with partial thyroidectomy. It is nontender.  CHEST: Symmetric and atraumatic.  CARDIOVASCULAR: Regular rate and rhythm. Normal S1, S2. No rubs, clicks, or murmurs appreciated.  LUNGS: Clear to auscultation bilaterally. Normal effort and excursion.  ABDOMEN: Positive bowel sounds. Soft, nontender, nondistended. No hepatosplenomegaly.  GENITOURINARY: Deferred.  MUSCULOSKELETAL: The patient moves all 4 extremities equally. I have not tested her gait.  SKIN: No rashes or lesions.  EXTREMITIES: No clubbing or cyanosis. The patient does have circumferential edema of her lower extremities, approximately 1+ in severity.  NEUROLOGIC: Cranial nerves II through XII are grossly intact.  PSYCHIATRIC: Mood is anxious. Her affect is pleasant. She has fair judgment and insight into her medical condition.   ASSESSMENT AND PLAN: This is a 74 year old female admitted for chest pain.   1. Chest pain, possible non ST segment elevation myocardial infarction. The patient's symptoms mostly consist of palpitations, but she  is not the best historian and there is a high component of anxiety, which makes gathering a specific timeline of symptoms and severity,  character and quality of chest pain difficult. Her initial troponin is elevated, but she has no electrocardiogram changes suggestive of ischemia. I have explained to the patient that for a rule out of myocardial ischemia, heparin drip is protocol; however, she refuses any blood thinner due to fear of gastrointestinal bleed. Thus, we will continue to cycle her cardiac enzymes. I have placed a cardiology consult for further guidance.  2. Hypertension, uncontrolled. A systolic blood pressure was originally more than 220. It has now improved with some intravenous hydralazine. We will continue losartan, p.o. hydralazine, verapamil and clonidine per the patient's home regimen. (She states she has not missed any doses of  this latter medication). I have added labetalol intravenous as needed for blood pressure more than 220.  3. Diabetes type 2. The patient has complained of some low blood sugars lately. This is, no doubt, due to her decreased appetite while maintaining the same insulin dosing. I will hold her scheduled insulin for now and we will give the patient sliding scale insulin while she is hospitalized.  4. Chronic kidney disease. The patient's baseline not available at this time, but she follows with nephrology for this problem. 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 We will try to avoid nephrotoxic agents.  5. Hyperlipidemia. Continue statin.  6. Deep vein thrombosis prophylaxis with sequential compression devices.  7. Gastrointestinal prophylaxis. We will continue the patient's  proton pump inhibitor per her home regimen.  8. The patient is a full code.   TIME SPENT ON ADMISSION ORDERS AND PATIENT CARE: Approximately 45 minutes   ____________________________ Norva Riffle. Marcille Blanco, MD msd:ap D: 08/13/2014 07:49:26 ET T: 08/13/2014 08:03:52  ET JOB#: [id number]  cc: Norva Riffle. Marcille Blanco, MD, <Dictator> Norva Riffle Lorice Lafave MD ELECTRONICALLY SIGNED 08/14/2014 19:28

## 2014-10-17 NOTE — Consult Note (Signed)
General Aspect Primary Cardiologist: New to [facility] _____________________  74 year old female with history of uncontrolled HTN, DM2, HLD, obesity, diverticulitis/diverticulosis in 2013 (requiring transfusion of 6 units of blood) and severe anxiety who presented to [facility] on 2/25 with palpitations, headache, and right arm pain (this is a chronic issue for her).  ____________________  PMH: 1. Uncontrolled HTN 2. DM2 3. HLD 4. Obesity 5. Severe anxiety 6. Diverticulitis/diverticulosis (requiring 6 unitis of blood) ______________________   Present Illness 74 year old female with the above problem list who presented to [facility] on 2/25 with the above CC.  She recently moved to [facility] from [place]. She reports having had a cardiac cath approximately 15 years ago while living in [place] that was nonobstructive. She last had a stress test about 10 years ago that she reports as being normal. She has not followed up with a cardiologist since moving to [place]. She has been seeing her PCP for uncontrolled HTN that has led to fairly significant headaches and palpitations, especially at nighttime. She notes a history of intermitttent right arm pain and right flank pain as well. She has not had any recent chst pain. She recently had an echo done by PCP 04/2014 secondary to HTN per patient which showed EF 65-70%, concentric LVH, elevated left atrial pressure, diastolic dysfunction, mild MR.   She presented to [facility] on 2/25 after stating she just could not take the headache and palpitations any longer. She also noted right arm pain and right flank pain at that time as well. She was laying in her bed when these occured. No exertional symptoms. Some associated SOB. No associated nausea, vomiting, diaphoresis, presyncope, or syncope. No chest pain. She also noted some intermittent swelling of all extremities.   Upon her arrival she was found to have a troponin level of 0.12-->0.12. She refused heparin gtt 2/2 history of GIB. SCr 2.21. She is  currently without symtpoms.   Physical Exam:  GEN well developed, no acute distress, obese   HEENT hearing intact to voice   NECK difficult to examine 2/2 body habitus   RESP normal resp effort  clear BS   CARD Regular rate and rhythm  No murmur   ABD denies tenderness  soft  obese   LYMPH negative neck   EXTR negative edema   SKIN normal to palpation   NEURO motor/sensory function intact   PSYCH alert, A+O to time, place, person, good insight, anxious   Review of Systems:  Subjective/Chief Complaint palpitations, headache   General: Fatigue   Skin: No Complaints   ENT: No Complaints   Eyes: No Complaints   Neck: No Complaints   Respiratory: Short of breath   Cardiovascular: Palpitations  Edema   Gastrointestinal: No Complaints   Genitourinary: No Complaints   Vascular: Leg cramping   Musculoskeletal: Muscle or joint pain   Neurologic: No Complaints   Hematologic: No Complaints   Endocrine: No Complaints   Psychiatric: No Complaints   Review of Systems: All other systems were reviewed and found to be negative   Medications/Allergies Reviewed Medications/Allergies reviewed   Family & Social History:  Family and Social History:  Family History Negative  Hypertension   Social History negative tobacco, negative ETOH, negative Illicit drugs   Place of Living Home     Hyperlipidemia:    Diabetes:    HTN:    Partial Hysterectomy:    Cholecystectomy:    Appendectomy:    Tubal Ligation:  Admit Diagnosis:   NSTEMI INITIAL EPISODE OF CARE: Onset Date: 13-Aug-2014, Status: Active, Description: NSTEMI INITIAL EPISODE OF CARE  Home Medications: Medication Instructions Status  hydrALAZINE 50 mg oral tablet 1 tab(s) orally 4 times a day Active  torsemide 20 mg oral tablet 1 tab(s) orally once a day Active  verapamil 240 mg/12 hours oral tablet, extended release 1 tab(s) orally 2 times a day Active  chlorthalidone 50 mg oral  tablet 1 tab(s) orally once a day Active  omeprazole 20 mg oral delayed release capsule 1 cap(s) orally once a day 30 mins prior to breakfast Active  NovoLIN N human recombinant 100 units/mL subcutaneous suspension 25 unit(s) subcutaneous once a day (in the morning) Active  NovoLIN N human recombinant 100 units/mL subcutaneous suspension 20 unit(s) subcutaneous once a day (before supper) Active  NovoLIN R human recombinant 100 units/mL injectable solution 5 unit(s) subcutaneous 2 times a day Active  Crestor 10 mg oral tablet 1 tab(s) orally once a day Active  losartan 100 mg oral tablet 1 tab(s) orally once a day Active  cloNIDine 0.2 mg oral tablet 1 tab(s) orally 2 times a day Active   Lab Results:  Thyroid:  26-Feb-16 02:23   Thyroid Stimulating Hormone 2.40 (0.45-4.50 (IU = International Unit)  ----------------------- Pregnant patients have  different reference  ranges for TSH:  - - - - - - - - - -  Pregnant, first trimetser:  0.36 - 2.50 uIU/mL)  Routine Chem:  26-Feb-16 02:23   Result Comment TROPONIN - RESULTS VERIFIED BY REPEAT TESTING.  - C/ JEANINA RODRIGUEZ _0  08-13-14. [facility]  - READ-BACK PROCESS PERFORMED.  Result(s) reported on 13 Aug 2014 at 03:38AM.  Cholesterol, Serum 140  Triglycerides, Serum 194  HDL (INHOUSE) 49  VLDL Cholesterol Calculated 39  LDL Cholesterol Calculated 52 (Result(s) reported on 13 Aug 2014 at 02:23PM.)  Glucose, Serum  196  BUN  41  Creatinine (comp)  2.21  Sodium, Serum  134  Potassium, Serum 3.6  Chloride, Serum  93  CO2, Serum 32  Calcium (Total), Serum 8.9  Anion Gap 9  Osmolality (calc) 284  eGFR (African American)  28  eGFR (Non-African American)  23 (eGFR values <76m/min/1.73 m2 may be an indication of chronic kidney disease (CKD). Calculated eGFR, using the MRDR Study equation, is useful in  patients with stable renal function. The eGFR calculation will not be reliable in acutely ill patients when serum creatinine is  changing rapidly. It is not useful in patients on dialysis. The eGFR calculation may not be applicable to patients at the low and high extremes of body sizes, pregnant women, and vegetarians.)  Cardiac:  26-Feb-16 02:23   Troponin I  0.12 (0.00-0.05 0.05 ng/mL or less: NEGATIVE  Repeat testing in 3-6 hrs  if clinically indicated. >0.05 ng/mL: POTENTIAL  MYOCARDIAL INJURY. Repeat  testing in 3-6 hrs if  clinically indicated. NOTE: An increase or decrease  of 30% or more on serial  testing suggests a  clinically important change)  CPK-MB, Serum  7.8 (Result(s) reported on 13 Aug 2014 at 06:48AM.)    06:30   Troponin I  0.12 (0.00-0.05 0.05 ng/mL or less: NEGATIVE  Repeat testing in 3-6 hrs  if clinically indicated. >0.05 ng/mL: POTENTIAL  MYOCARDIAL INJURY. Repeat  testing in 3-6 hrs if  clinically indicated. NOTE: An increase or decrease  of 30% or more on serial  testing suggests a  clinically important change)    10:39   Troponin I  0.09 (0.00-0.05 0.05 ng/mL or less: NEGATIVE  Repeat testing in 3-6 hrs  if clinically indicated. >0.05 ng/mL: POTENTIAL  MYOCARDIAL INJURY. Repeat  testing in 3-6 hrs if  clinically indicated. NOTE: An increase or decrease  of 30% or more on serial  testing suggests a  clinically important change)  Routine Coag:  26-Feb-16 02:23   Activated PTT (APTT) 31.6 (A HCT value >55% may artifactually increase the APTT. In one study, the increase was an average of 19%. Reference: "Effect on Routine and Special Coagulation Testing Values of Citrate Anticoagulant Adjustment in Patients with High HCT Values." American Journal of Clinical Pathology 3300;762:263-335.)  Prothrombin 12.8 (11.4-15.0 NOTE: New Reference Range  07/16/14)  INR 0.9 (INR reference interval applies to patients on anticoagulant therapy. A single INR therapeutic range for coumarins is not optimal for all indications; however, the suggested range for most indications  is 2.0 - 3.0. Exceptions to the INR Reference Range may include: Prosthetic heart valves, acute myocardial infarction, prevention of myocardial infarction, and combinations of aspirin and anticoagulant. The need for a higher or lower target INR must be assessed individually. Reference: The Pharmacology and Management of the Vitamin K  antagonists: the seventh ACCP Conference on Antithrombotic and Thrombolytic Therapy. KTGYB.6389 Sept:126 (3suppl): [id number]. A HCT value >55% may artifactually increase the PT.  In one study,  the increase was an average of 25%. Reference:  "Effect on Routine and Special Coagulation Testing Values of Citrate Anticoagulant Adjustment in Patients with High HCT Values." American Journal of Clinical Pathology 2006;126:400-405.)  Routine Hem:  26-Feb-16 02:23   WBC (CBC) 7.2  RBC (CBC) 4.32  Hemoglobin (CBC) 12.4  Hematocrit (CBC) 38.1  Platelet Count (CBC) 227 (Result(s) reported on 13 Aug 2014 at 03:24AM.)  MCV 88  MCH 28.6  MCHC 32.5  RDW 13.0   EKG:  EKG Interp. by me   Interpretation EKG shows NSR, 1st degree AV block, left axis deviation, prolonged QT, no significant st/t changes   Radiology Results:  XRay:    26-Feb-16 02:54, Chest Portable Single View  Chest Portable Single View   REASON FOR EXAM:    HTN, CP  COMMENTS:       PROCEDURE: DXR - DXR PORTABLE CHEST SINGLE VIEW  - Aug 13 2014  2:54AM     CLINICAL DATA:  High blood pressure for 2 weeks. Generalized chest  pain. History of hypertension, diabetes, hyperlipidemia 3    EXAM:  PORTABLE CHEST - 1 VIEW    COMPARISON:  02/12/2014    FINDINGS:  Cardiac enlargement without significant vascular congestion. No  focal airspace disease or consolidation. No blunting of costophrenic  angles. No pneumothorax. Calcification of theaorta.     IMPRESSION:  Mild cardiac enlargement.  No evidence of active pulmonary disease.      Electronically Signed    By: Lucienne Capers M.D.     On: 08/13/2014 02:58         Verified By: Neale Burly, M.D.,  Cardiology:    26-Feb-16 15:13, Echo Doppler  Echo Doppler   REASON FOR EXAM:      COMMENTS:       PROCEDURE: [facility] - ECHO DOPPLER COMPLETE(TRANSTHOR)  - Aug 13 2014  3:13PM     RESULT: Echocardiogram Report    Patient Name:   KRISSA UTKE Date of Exam: 08/13/2014  Medical Rec #:  [id number]           Custom1:  Date of Birth:  [birth year]  Height:       63.0 in  Patient Age:    74 years         Weight:       267.0 lb  Patient Gender: F                BSA:          2.19 m??    Indications: MI  Sonographer:    Tikeshia Stills RDCS  Referring Phys: DIAMOND, MICHAEL, S    Sonographer Comments: Technically difficult study due to poor echo   windows and suboptimal subcostal window.    Summary:   1. Left ventricular ejection fraction, by visual estimation, is 60 to   65%.   2. Normal global left ventricular systolic function.   3. Moderate left ventricular hypertrophy.   4. Unable to estimate diastolic function parameters   5. Normal right ventricular size and systolic function.  2D AND M-MODE MEASUREMENTS (normal ranges within parentheses):  Left Ventricle:          Normal  IVSd (2D):      1.64 cm (0.7-1.1)  LVPWd (2D):     1.66 cm (0.7-1.1) Aorta/LA:                  Normal  LVIDd (2D):     4.39 cm (3.4-5.7) Aortic Root (2D): 3.00 cm (2.4-3.7)  LVIDs (2D):     2.86 cm           Left Atrium (2D): 3.90 cm (1.9-4.0)  LV FS (2D):     34.9 %   (>25%)  LV EF (2D):     64.3 %   (>50%)                                    Right Ventricle:                                    RVd (2D):  SPECTRAL DOPPLER ANALYSIS (where applicable):  Aortic Valve: AoV Max Vel: 1.21 m/s AoV Peak PG: 5.9 mmHg AoV Mean PG:  LVOT Vmax: 0.83 m/s LVOT VTI:  LVOT Diameter: 2.10 cm  AoV Area, Vmax: 2.39 cm?? AoV Area, VTI:  AoV Area, Vmn:  Pulmonic Valve:  PV Max Velocity: 1.19 m/s PV Max PG: 5.7 mmHg PV Mean PG:    PHYSICIAN  INTERPRETATION:  Left Ventricle: The left ventricular internal cavity size was normal.   Moderate left ventricular hypertrophy. Global LV systolic function was   normal. Left ventricular ejection fraction, by visual estimation, is 60   to 65%.  Right Ventricle: Normal right ventricular size, wall thickness, and   systolic function. The right ventricular size is normal. Global RV   systolic function is normal.  Left Atrium: The left atrium is normal in size.  Right Atrium: The right atrium is normal insize.  Pericardium: There is no evidence of pericardial effusion.  Mitral Valve: The mitral valve is normal in structure. Trace mitral valve   regurgitation is seen.  Tricuspid Valve: The tricuspid valve is normal. Trivial tricuspid   regurgitation is visualized.  Aortic Valve: The aortic valve was not well seen. The aortic valve is     structurally normal, with no evidence of sclerosis or stenosis. No   evidence of aortic valve regurgitation is seen.  Pulmonic Valve: The pulmonic  valve is normal. Trace pulmonic valve   regurgitation.  Aorta: The aortic root and ascending aorta are structurally normal, with   no evidence of dilitation.    [id number] Ida Rogue MD  Electronically signed by [id number] Ida Rogue MD  Signature Date/Time: 08/13/2014/4:14:45 PM    *** Final ***    IMPRESSION: .    Verified By: Minna Merritts, M.D., MD    Penicillin: Hives  Vital Signs/Nurse's Notes: **Vital Signs.:   26-Feb-16 10:23  Vital Signs Type Admission  Temperature Temperature (F) 98.1  Celsius 36.7  Temperature Source oral  Pulse Pulse 87  Respirations Respirations 20  Systolic BP Systolic BP 962  Diastolic BP (mmHg) Diastolic BP (mmHg) 70  Mean BP 101  Pulse Ox % Pulse Ox % 97  Pulse Ox Activity Level  At rest  Oxygen Delivery Room Air/ 21 %    Impression 74 year old female with history of uncontrolled HTN, DM2, HLD, obesity, diverticulitis/diverticulosis in 2013 (requiring  transfusion of 6 units of blood) and severe anxiety who presented to [facility] on 2/25 with palpitations, headache, and right arm pain (this is a chronic issue for her).   1. Elevated troponin: -Possibly 2/2 demand ischemia in the setting of hypertensive heart disease (SBP in the ED 180s at times) -Mildly elevated troponin of 0.12 that has plateaued  -No indication at this time for heparin gtt Normal EF, moderate LVH ---would work on blood pressure  2. HTN: -Uncontrolled, long history of poorly controlled HTN -Increase hydralazine -Continue current medications ----likely has OSA. Needs outpt workup  3. HLD: -Continue Crestor  4. Anxiety: -Outpatient follow up  5. History of GIB: -Recent colonoscopy, she reports as normal -She reports being advised to avoid asa, nsaids  6. Palpitations at night likely from OSA. Needs outpt study  7. CRI: likely from chronic HTN. seen by Dr. Candiss Norse   Electronic Signatures: Rise Mu (PA-C)  (Signed [phone] 11:54)  Authored: General Aspect/Present Illness, History and Physical Exam, Review of System, Family & Social History, Past Medical History, Home Medications, Labs, EKG , Radiology, Allergies, Vital Signs/Nurse's Notes, Impression/Plan Ida Rogue (MD)  (Signed 26-Feb-16 16:51)  Authored: General Aspect/Present Illness, History and Physical Exam, Review of System, Family & Social History, Health Issues, Labs, EKG , Radiology, Impression/Plan  Co-Signer: General Aspect/Present Illness, History and Physical Exam, Review of System, Family & Social History, Past Medical History, Home Medications, Labs, EKG , Radiology, Allergies, Vital Signs/Nurse's Notes, Impression/Plan   Last Updated: 26-Feb-16 16:51 by Ida Rogue (MD)

## 2014-10-17 NOTE — Discharge Summary (Signed)
PATIENT NAME:  Alexis Farmer, Alexis Farmer MR#:  [id number] DATE OF BIRTH:  27-Apr-1941  DATE OF ADMISSION:  08/13/2014 DATE OF DISCHARGE:  08/14/2014  ADMITTING PHYSICIAN: Norva Riffle. Marcille Blanco, MD   DISCHARGING PHYSICIAN: Gladstone Lighter, MD   PRIMARY CARE PHYSICIAN: Enid Derry, MD  [place]: Cardiology consultation by Dr. Ida Rogue.  DISCHARGE DIAGNOSES:  1.  Chest pain, likely stable angina, from elevated blood pressure.  2.  Malignant hypertension.  3.  Possible obstructive sleep apnea.  4.  Insulin-dependent diabetes mellitus.  5.  Chronic kidney disease, stage 4.  6.  Anxiety.  7.  Gastroesophageal reflux disease.   DISCHARGE HOME MEDICATIONS:  1.  Omeprazole 20 mg p.o. daily.  2.  Novolin N human recombinant insulin suspension 20 units subcutaneously once a day before supper.  3.  Novolin R human recombinant solution 5 units twice a day subcutaneously.  4.  Crestor 10 mg p.o. daily. 5.  Novolin N human recombinant solution 25 units subcutaneously once a day.  6.  Losartan 100 mg p.o. daily.  7.  Clonidine 0.2 mg p.o. b.i.d.  8.  Verapamil 240 mg p.o. b.i.d.  9.  Hydralazine 50 mg p.o. 4 times a day.  10.  Torsemide 20 mg p.o. every other day.  11.  Xanax 0.5 mg p.o. q. 8 hours p.r.n. for anxiety.  12.  Metoprolol 25 mg p.o. b.i.d.  DISCHARGE OXYGEN: None.   DISCHARGE DIET: Low-sodium diet.   DISCHARGE ACTIVITY: As tolerated.  FOLLOWUP INSTRUCTIONS:  1.  PCP followup in 5 days.  2.  Will need a sleep study  referral for outpatient sleep study, underlying sleep apnea likely present.  3.  Cardiology followup with Dr. Rockey Situ in 2 weeks.  4.  Follow up with nephrology as per schedule.  5.  Advised to check blood pressure at home. If elevated greater than 0000000 systolic, advised to take anxiety pills first and recheck in 30 to 40 minutes. If still running elevated, take half of clonidine pill which is 0.1 mg and recheck. If not improved, advised to call  MD.  LABORATORY DATA AND IMAGING STUDIES: Prior to discharge: WBC 7.2, hemoglobin 12.0, hematocrit 37.0, platelet count 230,000.   Sodium 136, potassium 3.4, chloride 96, bicarbonate 33, BUN 34, creatinine 1.75, glucose 234, and calcium of 9.3. Echo Doppler showing LV ejection fraction 60% to 65%, moderate LVH noted. Diastolic function is unable to be estimated. Normal right-sided pressures. Troponin slightly elevated at 0.09. Chest x-ray on admission showing mild cardiac enlargement. No evidence of acute cardiopulmonary disease. INR 0.9.   Cholesterol: LDL 52, total cholesterol 140, triglycerides 194, and HDL cholesterol is 49. TSH is 2.4.  BRIEF HOSPITAL COURSE: Alexis Farmer is a 74 year old African American female with past medical history significant for stage 4 CKD progressing, following with nephrologist Dr. Candiss Norse; hypertension, uncontrolled at baseline; anxiety; diabetes; comes to the hospital secondary to chest pain, headache, and noted to have elevated blood pressure, systolic in the [id number].  1.  Chest pain. Could be stable angina from her blood pressure being elevated so high. Troponins were barely elevated at 0.09, and with her renal failure she was just monitored and the troponins plateaued. Seen by cardiologist, who did not feel that this was an acute coronary syndrome. The patient did not have further chest pain and was advised to follow up with cardiology as an outpatient. Echo did not show any wall motion abnormalities.  2.  Malignant hypertension, uncontrolled as outpatient. With her LVH, cardiology felt  like she probably will need some preload and advised to discontinue her chlorthalidone and also decrease her torsemide to every other day. She is already on hydralazine, clonidine, verapamil, losartan. Her blood pressure has not been that elevated in the hospital. She is being started on metoprolol b.i.d. while the chlorthalidone is being discontinued, and she was given instructions on how  to take half a clonidine pill if needed if her systolic pressure goes high. She also has a lot of anxiety issues and that needs to be addressed first by PCP. That could be contributing to her blood pressure, as well.  3.  Anxiety. Started on Xanax for now but might need some long-term maintenance medication like Celexa or Lexapro.  4.  Diabetes mellitus. Continue her  diabetic regimen at home.  5.  CKD stage 4. No baseline available, but follows with Dr. Candiss Norse as an outpatient. Advised to continue to do so.   Her course has been otherwise uneventful in the hospital.   DISCHARGE CONDITION: Stable.   DISCHARGE DISPOSITION: Home.  TIME SPENT ON DISCHARGE: 45 minutes.   ____________________________ Gladstone Lighter, MD rk:ST D: 08/15/2014 12:00:35 ET T: 08/15/2014 21:59:48 ET JOB#: [id number]  cc: Gladstone Lighter, MD, <Dictator> Enid Derry, MD Murlean Iba, MD Gladstone Lighter MD ELECTRONICALLY SIGNED 09/02/2014 17:50

## 2014-11-23 ENCOUNTER — Telehealth: Payer: Self-pay

## 2014-11-23 NOTE — Telephone Encounter (Signed)
Pt states she is having a "little tremor" states it starts the top of her left breast and radiates up to her neck. Denies pain.

## 2014-11-23 NOTE — Telephone Encounter (Signed)
Patient called again and is very nervous about her tremor, please call patient.

## 2014-11-23 NOTE — Telephone Encounter (Signed)
I spoke with the patient. She reports having a "tremor" feeling to on the front side of her chest over her left breast to her neck. She states "there's no pain, it just feels like a tremor." She was concerned about this coming from her heart.  She has noticed this for 2 weeks. She does not relate this to movement or anything in particular that triggers this for her.  I have advised her this does not sound cardiac in nature.  I have asks that she call her PCP for follow up if she is concerned about this. She is agreeable.

## 2014-12-06 ENCOUNTER — Ambulatory Visit: Payer: Medicare Other

## 2014-12-09 ENCOUNTER — Ambulatory Visit (INDEPENDENT_AMBULATORY_CARE_PROVIDER_SITE_OTHER): Payer: Medicare Other | Admitting: Podiatry

## 2014-12-09 DIAGNOSIS — B351 Tinea unguium: Secondary | ICD-10-CM

## 2014-12-09 DIAGNOSIS — E1165 Type 2 diabetes mellitus with hyperglycemia: Secondary | ICD-10-CM

## 2014-12-09 DIAGNOSIS — M79676 Pain in unspecified toe(s): Secondary | ICD-10-CM | POA: Diagnosis not present

## 2014-12-09 DIAGNOSIS — IMO0002 Reserved for concepts with insufficient information to code with codable children: Secondary | ICD-10-CM

## 2014-12-09 NOTE — Progress Notes (Signed)
Subjective: 74 y.o.-year-old female returns the office today for painful, elongated, thickened toenails which she is unable to trim herself. Denies any redness or drainage around the nails. Denies any acute changes since last appointment and no new complaints today. She does that she isn't getting numbness to her feet. Is in ongoing for quite some time she recently. Denies any systemic complaints such as fevers, chills, nausea, vomiting.   Objective: AAO 3, NAD DP/PT pulses palpable 1/4, CRT less than 3 seconds Protective sensation decreasedwith Simms Weinstein monofilamen Nails hypertrophic, dystrophic, elongated, brittle, discolored 10. There is tenderness overlying the nails 1-5 bilaterally. There is no surrounding erythema or drainage along the nail sites. No open lesions or pre-ulcerative lesions are identified. No other areas of tenderness bilateral lower extremities. No overlying edema, erythema, increased warmth. No pain with calf compression, swelling, warmth, erythema.  Assessment: Patient presents with symptomatic onychomycosis; neuropathy   Plan: -Treatment options including alternatives, risks, complications were discussed -Nails sharply debrided 10 without complication/bleeding. -Discussed treatment options for neuropathy an she wishes to hold off on any medications -Discussed daily foot inspection. If there are any changes, to call the office immediately.  -Follow-up in 3 months or sooner if any problems are to arise. In the meantime, encouraged to call the office with any questions, concerns, changes symptoms.

## 2015-03-03 ENCOUNTER — Other Ambulatory Visit: Payer: Self-pay | Admitting: Unknown Physician Specialty

## 2015-03-07 ENCOUNTER — Ambulatory Visit (INDEPENDENT_AMBULATORY_CARE_PROVIDER_SITE_OTHER): Payer: Medicare Other | Admitting: Cardiovascular Disease

## 2015-03-07 ENCOUNTER — Telehealth: Payer: Self-pay | Admitting: Cardiovascular Disease

## 2015-03-07 ENCOUNTER — Encounter: Payer: Self-pay | Admitting: Cardiovascular Disease

## 2015-03-07 ENCOUNTER — Encounter: Payer: Self-pay | Admitting: *Deleted

## 2015-03-07 VITALS — BP 124/60 | HR 63 | Ht 63.0 in | Wt 274.5 lb

## 2015-03-07 DIAGNOSIS — E1165 Type 2 diabetes mellitus with hyperglycemia: Secondary | ICD-10-CM

## 2015-03-07 DIAGNOSIS — IMO0002 Reserved for concepts with insufficient information to code with codable children: Secondary | ICD-10-CM

## 2015-03-07 DIAGNOSIS — R600 Localized edema: Secondary | ICD-10-CM

## 2015-03-07 DIAGNOSIS — I10 Essential (primary) hypertension: Secondary | ICD-10-CM | POA: Diagnosis not present

## 2015-03-07 DIAGNOSIS — E785 Hyperlipidemia, unspecified: Secondary | ICD-10-CM

## 2015-03-07 MED ORDER — VALSARTAN 320 MG PO TABS: 320.0000 mg | ORAL_TABLET | Freq: Every day | ORAL | Status: DC

## 2015-03-07 MED ORDER — VERAPAMIL HCL ER 240 MG PO TBCR: 240.0000 mg | EXTENDED_RELEASE_TABLET | Freq: Every day | ORAL | Status: DC

## 2015-03-07 NOTE — Telephone Encounter (Signed)
Pt saw Dr. Rockey Situ today I have corrected the Novolin (R) 15 units am 15 units noon and 15 units pm daily Novolin (N) 35 units am and 20 units pm daily.

## 2015-03-07 NOTE — Assessment & Plan Note (Signed)
Encouraged her to stay on her Crestor 10 mg daily No recent lipid panel available

## 2015-03-07 NOTE — Telephone Encounter (Signed)
Patient says her medication was wrong on avs from todays visit.  Patient says she Takes Novolin R 15 Units morning, 15 units noon, 15 units night.  Patient says she takes Novolin N 35 units morning and 20 units at night .  Patient wants to make sure med list is corrected.

## 2015-03-07 NOTE — Assessment & Plan Note (Signed)
We have encouraged continued exercise, careful diet management in an effort to lose weight. 

## 2015-03-07 NOTE — Assessment & Plan Note (Signed)
Significant leg swelling, likely exacerbated by high-dose calcium channel blocker. Recommended she decrease her verapamil dosing down to 240 mg daily, down from twice a day dosing To compensate for her decreased verapamil, will increase Diovan up to 320 mg daily She has close follow-up with renal and primary care

## 2015-03-07 NOTE — Assessment & Plan Note (Signed)
As above, edema likely exacerbated by calcium channel blocker, verapamil We'll decrease dose down to 240 mg daily If there is a improvement in her leg edema, could continue weaning the verapamil. Would need ALT medication for her blood pressure most likely. One option would be to increase clonidine or to avoid further dry mouth, could restart hydralazine, could even consider Cardura

## 2015-03-07 NOTE — Patient Instructions (Addendum)
You are doing well.  Please hold one of the verapamil, only take one a day Increase the valsartan up to 320 mg once a day  If blood pressure runs high, We could increase the clonidine up to 0.3 mg twice a day  Please call us if you have new issues that need to be addressed before your next appt.  Your physician wants you to follow-up in: 3 months.

## 2015-03-07 NOTE — Progress Notes (Signed)
Patient ID: Alexis Farmer, female    DOB: Jan 24, 1941, 74 y.o.   MRN: [id number]  HPI Comments: Alexis Farmer is a 74 year old woman with history of obesity, hypertension, hyperlipidemia, moderate LVH with recent admission to the hospital 08/13/2014 with discharge February 27 with symptoms of chest pain, malignant hypertension, possible sleep apnea, insulin-dependent diabetes, chronic kidney disease, anxiety. She presents to for follow-up of her hypertension Notes indicate previous history of GI bleeding but recent colonoscopy which was normal by her report  In follow up today, she reports blood pressure has been relatively well-controlled. She does have significant lower extremity swelling. Normal renal function. Hydralazine recently held, started on Diovan 160 mg daily several weeks ago. Follow-up normal renal function with primary care. Some shortness of breath with exertion which is a chronic issue. Trying to lose weight but having difficulty. Unable to walk for a far secondary to her swelling. Does not wear compression hose, only diabetic socks.  EKG on today's visit shows normal sinus rhythm with rate 63 bpm, left anterior fascicular block, poor R-wave progression  Previous Echocardiogram 08/13/2014 showed normal ejection fraction, normal RV size and function, moderate LVH  Other past medical history Renal artery duplex showing no blockage of the mid to distal vessels bilaterally, challenging study    Allergies  Allergen Reactions  . Penicillins     Outpatient Encounter Prescriptions as of 03/07/2015  Medication Sig  . ALPRAZolam (XANAX) 0.5 MG tablet Take 0.5 mg by mouth as needed for anxiety.   . Cholecalciferol (VITAMIN D-3) 1000 UNITS CAPS Take 1,000 Units by mouth daily.  . cloNIDine (CATAPRES) 0.2 MG tablet Take 1 tablet (0.2 mg total) by mouth 2 (two) times daily.  Marland Kitchen doxycycline (VIBRAMYCIN) 100 MG capsule Take 100 mg by mouth 2 (two) times daily.  . Garlic [id number] MG CAPS  Take 500 mg by mouth daily.  . insulin regular (NOVOLIN R) 100 units/mL injection Inject 0.15 mLs (15 Units total) into the skin 2 (two) times daily before a meal. (Patient taking differently: Take 35 units in the am and 20 units in the pm.)  . metoprolol (LOPRESSOR) 50 MG tablet Take 1 tablet (50 mg total) by mouth 2 (two) times daily.  . Multiple Vitamins-Minerals (CENTRUM ADULTS PO) Take by mouth.  Marland Kitchen NOVOLIN N 100 UNIT/ML injection Inject 10 Units into the skin 3 (three) times daily.   . polyethylene glycol powder (GLYCOLAX/MIRALAX) powder   . pyridOXINE (VITAMIN B-6) 100 MG tablet Take 200 mg by mouth daily.  . rosuvastatin (CRESTOR) 10 MG tablet Take 1 tablet (10 mg total) by mouth daily.  Marland Kitchen spironolactone (ALDACTONE) 25 MG tablet Take 25 mg by mouth daily.  Marland Kitchen torsemide (DEMADEX) 20 MG tablet Take 1 tablet (20 mg total) by mouth daily.  . valsartan (DIOVAN) 160 MG tablet Take 160 mg by mouth daily.  . verapamil (CALAN-SR) 240 MG CR tablet Take 1 tablet (240 mg total) by mouth 2 (two) times daily.  . vitamin B-12 (CYANOCOBALAMIN) 1000 MCG tablet Take 1,000 mcg by mouth daily.  . [DISCONTINUED] hydrALAZINE (APRESOLINE) 50 MG tablet Take 1 tablet (50 mg total) by mouth 2 (two) times daily. (Patient not taking: Reported on 03/07/2015)  . [DISCONTINUED] losartan (COZAAR) 100 MG tablet Take 1 tablet (100 mg total) by mouth daily. (Patient not taking: Reported on 03/07/2015)  . [DISCONTINUED] omeprazole (PRILOSEC) 20 MG capsule Take 20 mg by mouth daily.   . [DISCONTINUED] SUPREP BOWEL PREP SOLN   . [DISCONTINUED] vitamin E 400 UNIT  capsule Take 400 Units by mouth daily.   No facility-administered encounter medications on file as of 03/07/2015.    Past Medical History  Diagnosis Date  . Diabetes mellitus without complication   . Hypertension   . High cholesterol     Past Surgical History  Procedure Laterality Date  . Ectopic pregnancy surgery    . Gallbladder     . Thyroidectomy, partial     . Appendectomy      Social History  reports that she has never smoked. She has never used smokeless tobacco. She reports that she does not drink alcohol or use illicit drugs.  Family History family history includes Diabetes in her mother, other, and sister; Heart disease in her sister; Hypertension in her mother.   Review of Systems  Constitutional: Negative.   Respiratory: Negative.   Cardiovascular: Positive for leg swelling.  Gastrointestinal: Negative.   Musculoskeletal: Positive for gait problem.  Skin: Negative.   Neurological: Negative.   Hematological: Negative.   Psychiatric/Behavioral: Negative.   All other systems reviewed and are negative.   BP 124/60 mmHg  Pulse 63  Ht 5\' 3"  (1.6 m)  Wt 274 lb 8 oz (124.512 kg)  BMI 48.64 kg/m2  Physical Exam  Constitutional: She is oriented to person, place, and time. She appears well-developed and well-nourished.  Obese  HENT:  Head: Normocephalic.  Nose: Nose normal.  Mouth/Throat: Oropharynx is clear and moist.  Eyes: Conjunctivae are normal. Pupils are equal, round, and reactive to light.  Neck: Normal range of motion. Neck supple. No JVD present.  Cardiovascular: Normal rate, regular rhythm, S1 normal, S2 normal and intact distal pulses.  Exam reveals no gallop and no friction rub.   Murmur heard.  Systolic murmur is present with a grade of 2/6  Trace to 1+ pitting edema to below the knees  Pulmonary/Chest: Effort normal and breath sounds normal. No respiratory distress. She has no wheezes. She has no rales. She exhibits no tenderness.  Abdominal: Soft. Bowel sounds are normal. She exhibits no distension. There is no tenderness.  Musculoskeletal: Normal range of motion. She exhibits no edema or tenderness.  Lymphadenopathy:    She has no cervical adenopathy.  Neurological: She is alert and oriented to person, place, and time. Coordination normal.  Skin: Skin is warm and dry. No rash noted. No erythema.   Psychiatric: She has a normal mood and affect. Her behavior is normal. Judgment and thought content normal.    Assessment and Plan  Nursing note and vitals reviewed.

## 2015-03-11 DIAGNOSIS — N183 Chronic kidney disease, stage 3 (moderate): Secondary | ICD-10-CM

## 2015-03-11 DIAGNOSIS — E1165 Type 2 diabetes mellitus with hyperglycemia: Secondary | ICD-10-CM | POA: Insufficient documentation

## 2015-03-11 DIAGNOSIS — E1122 Type 2 diabetes mellitus with diabetic chronic kidney disease: Secondary | ICD-10-CM | POA: Insufficient documentation

## 2015-03-11 DIAGNOSIS — Z794 Long term (current) use of insulin: Secondary | ICD-10-CM

## 2015-03-11 HISTORY — DX: Type 2 diabetes mellitus with hyperglycemia: E11.65

## 2015-03-14 ENCOUNTER — Ambulatory Visit (INDEPENDENT_AMBULATORY_CARE_PROVIDER_SITE_OTHER): Payer: Medicare Other | Admitting: Podiatry

## 2015-03-14 DIAGNOSIS — M79676 Pain in unspecified toe(s): Secondary | ICD-10-CM | POA: Diagnosis not present

## 2015-03-14 DIAGNOSIS — E1165 Type 2 diabetes mellitus with hyperglycemia: Secondary | ICD-10-CM | POA: Diagnosis not present

## 2015-03-14 DIAGNOSIS — B351 Tinea unguium: Secondary | ICD-10-CM | POA: Diagnosis not present

## 2015-03-14 DIAGNOSIS — IMO0002 Reserved for concepts with insufficient information to code with codable children: Secondary | ICD-10-CM

## 2015-03-14 NOTE — Progress Notes (Signed)
He presents today chief complaint of painful elongated toenails.  Objective: Nails are thick yellow dystrophic with mycotic pulses are palpable bilateral.  Assessment: Pain in limb secondary to onychomycosis 1 through 5 bilateral.  Plan: Debridement of nails 1 through 5 bilateral covered service secondary to pain.  Roselind Messier DPM

## 2015-06-02 ENCOUNTER — Ambulatory Visit: Payer: Medicare Other | Admitting: *Deleted

## 2015-06-02 DIAGNOSIS — M79671 Pain in right foot: Secondary | ICD-10-CM

## 2015-06-02 DIAGNOSIS — M79672 Pain in left foot: Principal | ICD-10-CM

## 2015-06-02 NOTE — Progress Notes (Signed)
Patient ID: Alexis Farmer, female   DOB: 1941-01-31, 74 y.o.   MRN: [id number] Patient presents to be scanned and measured for diabetic shoes and inserts.

## 2015-06-06 ENCOUNTER — Encounter: Payer: Self-pay | Admitting: Cardiovascular Disease

## 2015-06-06 ENCOUNTER — Ambulatory Visit (INDEPENDENT_AMBULATORY_CARE_PROVIDER_SITE_OTHER): Payer: Medicare Other | Admitting: Cardiovascular Disease

## 2015-06-06 VITALS — BP 141/79 | HR 63 | Temp 97.4°F | Ht 63.0 in | Wt 266.8 lb

## 2015-06-06 DIAGNOSIS — N183 Chronic kidney disease, stage 3 unspecified: Secondary | ICD-10-CM

## 2015-06-06 DIAGNOSIS — IMO0001 Reserved for inherently not codable concepts without codable children: Secondary | ICD-10-CM

## 2015-06-06 DIAGNOSIS — E785 Hyperlipidemia, unspecified: Secondary | ICD-10-CM

## 2015-06-06 DIAGNOSIS — E1165 Type 2 diabetes mellitus with hyperglycemia: Secondary | ICD-10-CM | POA: Diagnosis not present

## 2015-06-06 DIAGNOSIS — I10 Essential (primary) hypertension: Secondary | ICD-10-CM

## 2015-06-06 NOTE — Assessment & Plan Note (Signed)
We have encouraged continued exercise, careful diet management in an effort to lose weight. 

## 2015-06-06 NOTE — Progress Notes (Signed)
Patient ID: Alexis Farmer, female    DOB: Apr 02, 1941, 74 y.o.   MRN: [id number]  HPI Comments: Alexis Farmer is a 74 year old woman with history of obesity, hypertension, hyperlipidemia, moderate LVH with recent admission to the hospital 08/13/2014 with discharge February 27 with symptoms of chest pain, malignant hypertension, possible sleep apnea, insulin-dependent diabetes, chronic kidney disease, anxiety. She presents to for follow-up of her hypertension Notes indicate previous history of GI bleeding but recent colonoscopy which was normal by her report  In follow-up today, she reports that she is doing well Blood pressure has been relatively well-controlled on her current medications On lower dose verapamil, leg edema has resolved Tolerating higher dose ARB with no dramatic change in her renal function Hemoglobin A1c greater than 9, poor diet, difficulty losing weight only getting over upper respiratory infection. Took doxycycline per the patient, still with pressure in her ear on the left Some gait instability, no regular exercise  No EKG on today's visit  Previous Echocardiogram 08/13/2014 showed normal ejection fraction, normal RV size and function, moderate LVH  Other past medical history Renal artery duplex showing no blockage of the mid to distal vessels bilaterally, challenging study    Allergies  Allergen Reactions  . Penicillins     Outpatient Encounter Prescriptions as of 06/06/2015  Medication Sig  . ALPRAZolam (XANAX) 0.5 MG tablet Take 0.5 mg by mouth as needed for anxiety.   Marland Kitchen aspirin 81 MG tablet Take 81 mg by mouth daily.  . cloNIDine (CATAPRES) 0.2 MG tablet Take 1 tablet (0.2 mg total) by mouth 2 (two) times daily.  . insulin regular (NOVOLIN R) 100 units/mL injection Inject 0.15 mLs (15 Units total) into the skin 2 (two) times daily before a meal. (Patient taking differently: Takes 15 units am, 15 units noon and 15 units pm daily.)  . meclizine (ANTIVERT)  12.5 MG tablet Take 12.5 mg by mouth 3 (three) times daily as needed for dizziness.  . metoprolol (LOPRESSOR) 50 MG tablet Take 1 tablet (50 mg total) by mouth 2 (two) times daily.  . Multiple Vitamins-Minerals (CENTRUM ADULTS PO) Take by mouth.  Marland Kitchen NOVOLIN N 100 UNIT/ML injection Takes 35 units am and 20 units pm daily.  . polyethylene glycol powder (GLYCOLAX/MIRALAX) powder Take 1 Container by mouth daily.   . rosuvastatin (CRESTOR) 10 MG tablet Take 1 tablet (10 mg total) by mouth daily.  Marland Kitchen spironolactone (ALDACTONE) 25 MG tablet Take 25 mg by mouth daily.  Marland Kitchen torsemide (DEMADEX) 20 MG tablet Take 1 tablet (20 mg total) by mouth daily.  . valsartan (DIOVAN) 320 MG tablet Take 1 tablet (320 mg total) by mouth daily.  . verapamil (VERELAN PM) 240 MG 24 hr capsule Take 240 mg by mouth at bedtime.  . [DISCONTINUED] Cholecalciferol (VITAMIN D-3) 1000 UNITS CAPS Take 1,000 Units by mouth daily. Reported on 06/06/2015  . [DISCONTINUED] doxycycline (VIBRAMYCIN) 100 MG capsule Take 100 mg by mouth 2 (two) times daily. Reported on 06/06/2015  . [DISCONTINUED] Garlic [id number] MG CAPS Take 500 mg by mouth daily. Reported on 06/06/2015  . [DISCONTINUED] pyridOXINE (VITAMIN B-6) 100 MG tablet Take 200 mg by mouth daily. Reported on 06/06/2015  . [DISCONTINUED] valsartan (DIOVAN) 320 MG tablet Take 320 mg by mouth daily. Reported on 06/06/2015  . [DISCONTINUED] verapamil (CALAN-SR) 240 MG CR tablet Take 1 tablet (240 mg total) by mouth daily. (Patient not taking: Reported on 06/06/2015)  . [DISCONTINUED] vitamin B-12 (CYANOCOBALAMIN) 1000 MCG tablet Take 1,000 mcg by mouth daily.  Reported on 06/06/2015   No facility-administered encounter medications on file as of 06/06/2015.    Past Medical History  Diagnosis Date  . Diabetes mellitus without complication ([place])   . Hypertension   . High cholesterol     Past Surgical History  Procedure Laterality Date  . Ectopic pregnancy surgery    . Gallbladder     .  Thyroidectomy, partial    . Appendectomy      Social History  reports that she has never smoked. She has never used smokeless tobacco. She reports that she does not drink alcohol or use illicit drugs.  Family History family history includes Diabetes in her mother, other, and sister; Heart disease in her sister; Hypertension in her mother.   Review of Systems  Constitutional: Negative.   Respiratory: Negative.   Cardiovascular: Negative.   Gastrointestinal: Negative.   Musculoskeletal: Positive for gait problem.  Skin: Negative.   Neurological: Negative.   Hematological: Negative.   Psychiatric/Behavioral: Negative.   All other systems reviewed and are negative.   BP 141/79 mmHg  Pulse 63  Temp(Src) 97.4 F (36.3 C)  Ht 5\' 3"  (1.6 m)  Wt 266 lb 12 oz (120.997 kg)  BMI 47.26 kg/m2  Physical Exam  Constitutional: She is oriented to person, place, and time. She appears well-developed and well-nourished.  Obese  HENT:  Head: Normocephalic.  Nose: Nose normal.  Mouth/Throat: Oropharynx is clear and moist.  Eyes: Conjunctivae are normal. Pupils are equal, round, and reactive to light.  Neck: Normal range of motion. Neck supple. No JVD present.  Cardiovascular: Normal rate, regular rhythm, S1 normal, S2 normal and intact distal pulses.  Exam reveals no gallop and no friction rub.   Murmur heard.  Systolic murmur is present with a grade of 2/6  Trace to 1+ pitting edema to below the knees  Pulmonary/Chest: Effort normal and breath sounds normal. No respiratory distress. She has no wheezes. She has no rales. She exhibits no tenderness.  Abdominal: Soft. Bowel sounds are normal. She exhibits no distension. There is no tenderness.  Musculoskeletal: Normal range of motion. She exhibits no edema or tenderness.  Lymphadenopathy:    She has no cervical adenopathy.  Neurological: She is alert and oriented to person, place, and time. Coordination normal.  Skin: Skin is warm and dry.  No rash noted. No erythema.  Psychiatric: She has a normal mood and affect. Her behavior is normal. Judgment and thought content normal.    Assessment and Plan  Nursing note and vitals reviewed.

## 2015-06-06 NOTE — Assessment & Plan Note (Signed)
Stable renal function on ARB

## 2015-06-06 NOTE — Assessment & Plan Note (Signed)
Unable to exercise on a regular basis secondary to orthopedic issues Stress the importance of low carbohydrate diet for weight loss

## 2015-06-06 NOTE — Assessment & Plan Note (Signed)
Blood pressure is well controlled on today's visit. No changes made to the medications. 

## 2015-06-06 NOTE — Patient Instructions (Addendum)
You are doing well. No medication changes were made.  Please try sudafed (or generic) for ear pressure  Please call us if you have new issues that need to be addressed before your next appt.  Your physician wants you to follow-up in: 12 months.  You will receive a reminder letter in the mail two months in advance. If you don't receive a letter, please call our office to schedule the follow-up appointment.

## 2015-06-06 NOTE — Assessment & Plan Note (Signed)
Cholesterol is at goal on the current lipid regimen. No changes to the medications were made.  

## 2015-06-14 ENCOUNTER — Ambulatory Visit (INDEPENDENT_AMBULATORY_CARE_PROVIDER_SITE_OTHER): Payer: Medicare Other | Admitting: Sports Medicine

## 2015-06-14 ENCOUNTER — Encounter: Payer: Self-pay | Admitting: Sports Medicine

## 2015-06-14 DIAGNOSIS — IMO0001 Reserved for inherently not codable concepts without codable children: Secondary | ICD-10-CM

## 2015-06-14 DIAGNOSIS — M79676 Pain in unspecified toe(s): Secondary | ICD-10-CM | POA: Diagnosis not present

## 2015-06-14 DIAGNOSIS — E1165 Type 2 diabetes mellitus with hyperglycemia: Secondary | ICD-10-CM | POA: Diagnosis not present

## 2015-06-14 DIAGNOSIS — B351 Tinea unguium: Secondary | ICD-10-CM

## 2015-06-14 DIAGNOSIS — Z794 Long term (current) use of insulin: Secondary | ICD-10-CM

## 2015-06-14 NOTE — Progress Notes (Signed)
Patient ID: Alexis Farmer, female   DOB: Aug 24, 1940, 74 y.o.   MRN: [id number] Subjective: Alexis Farmer is a 74 y.o. female patient with history of type  2 diabetes and CKD who presents to office today complaining of long, painful nails  while ambulating in shoes; unable to trim. Patient states that the glucose reading this morning was "good". Denies new changes in medication or new problems. Patient denies any new cramping, numbness, burning or tingling in the legs.   A1c- 9.2  Patient Active Problem List   Diagnosis Date Noted  . Hyperlipidemia 08/26/2014  . Essential hypertension, benign 04/08/2014  . Diabetes mellitus type II, uncontrolled ([place]) 04/08/2014  . CKD (chronic kidney disease) stage 3, GFR 30-59 ml/min 04/08/2014  . Pedal edema 04/08/2014  . Morbid obesity ([place]) 04/08/2014  . Lower back pain 04/08/2014  . Diabetic polyneuropathy ([place]) 04/08/2014   Current Outpatient Prescriptions on File Prior to Visit  Medication Sig Dispense Refill  . ALPRAZolam (XANAX) 0.5 MG tablet Take 0.5 mg by mouth as needed for anxiety.     Marland Kitchen aspirin 81 MG tablet Take 81 mg by mouth daily.    . cloNIDine (CATAPRES) 0.2 MG tablet Take 1 tablet (0.2 mg total) by mouth 2 (two) times daily. 180 tablet 4  . insulin regular (NOVOLIN R) 100 units/mL injection Inject 0.15 mLs (15 Units total) into the skin 2 (two) times daily before a meal. (Patient taking differently: Takes 15 units am, 15 units noon and 15 units pm daily.) 10 mL 0  . meclizine (ANTIVERT) 12.5 MG tablet Take 12.5 mg by mouth 3 (three) times daily as needed for dizziness.    . metoprolol (LOPRESSOR) 50 MG tablet Take 1 tablet (50 mg total) by mouth 2 (two) times daily. 180 tablet 4  . Multiple Vitamins-Minerals (CENTRUM ADULTS PO) Take by mouth.    Marland Kitchen NOVOLIN N 100 UNIT/ML injection Takes 35 units am and 20 units pm daily.    . polyethylene glycol powder (GLYCOLAX/MIRALAX) powder Take 1 Container by mouth daily.     . rosuvastatin  (CRESTOR) 10 MG tablet Take 1 tablet (10 mg total) by mouth daily. 90 tablet 4  . spironolactone (ALDACTONE) 25 MG tablet Take 25 mg by mouth daily.    Marland Kitchen torsemide (DEMADEX) 20 MG tablet Take 1 tablet (20 mg total) by mouth daily. 90 tablet 4  . valsartan (DIOVAN) 320 MG tablet Take 1 tablet (320 mg total) by mouth daily. 90 tablet 3  . verapamil (VERELAN PM) 240 MG 24 hr capsule Take 240 mg by mouth at bedtime.     No current facility-administered medications on file prior to visit.   Allergies  Allergen Reactions  . Penicillins      Objective: General: Patient is awake, alert, and oriented x 3 and in no acute distress.  Integument: Skin is warm, dry and supple bilateral. Nails are tender, long, thickened and  dystrophic with subungual debris, consistent with onychomycosis, 1-5 bilateral. No signs of infection. No open lesions or preulcerative lesions present bilateral. Remaining integument unremarkable.  Vasculature:  Dorsalis Pedis pulse 2/4 bilateral. Posterior Tibial pulse  1/4 bilateral.  Capillary fill time <3 sec 1-5 bilateral. Scant hair growth to the level of the digits. Temperature gradient within normal limits. No varicosities present bilateral. No edema present bilateral.   Neurology: The patient has intact sensation measured with a 5.07/10g Semmes Weinstein Monofilament at all pedal sites bilateral . Vibratory sensation diminished bilateral with tuning fork. No Babinski sign present  bilateral.   Musculoskeletal: No gross pedal deformities noted bilateral. Muscular strength 5/5 in all lower extremity muscular groups bilateral without pain or limitation on range of motion . No tenderness with calf compression bilateral.  Assessment and Plan: Problem List Items Addressed This Visit    None    Visit Diagnoses    Dermatophytosis of nail    -  Primary    Pain of toe, unspecified laterality        Uncontrolled type 2 diabetes mellitus without complication, with long-term  current use of insulin ([place])           -Examined patient. -Discussed and educated patient on diabetic foot care, especially with  regards to the vascular, neurological and musculoskeletal systems.  -Stressed the importance of good glycemic control and the detriment of not  controlling glucose levels in relation to the foot. -Mechanically debrided all nails 1-5 bilateral using sterile nail nipper and filed with dremel without incident  -Answered all patient questions -Patient to return in 3 months for at risk foot care -Patient advised to call the office if any problems or questions arise in the meantime.  Alexis Farmer, DPM

## 2015-06-15 ENCOUNTER — Ambulatory Visit: Payer: Medicare Other | Admitting: Podiatry

## 2015-06-30 ENCOUNTER — Ambulatory Visit: Payer: Medicare Other | Admitting: Family Medicine

## 2015-07-20 ENCOUNTER — Ambulatory Visit (INDEPENDENT_AMBULATORY_CARE_PROVIDER_SITE_OTHER): Payer: Medicare Other | Admitting: Podiatry

## 2015-07-20 DIAGNOSIS — Z794 Long term (current) use of insulin: Principal | ICD-10-CM

## 2015-07-20 DIAGNOSIS — IMO0001 Reserved for inherently not codable concepts without codable children: Secondary | ICD-10-CM

## 2015-07-20 DIAGNOSIS — Q6650 Congenital pes planus, unspecified foot: Secondary | ICD-10-CM

## 2015-07-20 DIAGNOSIS — E1165 Type 2 diabetes mellitus with hyperglycemia: Principal | ICD-10-CM

## 2015-07-22 NOTE — Progress Notes (Signed)
Patient presents to pick up diabetic shoes and insoles. Follow up as needed.

## 2015-08-02 ENCOUNTER — Encounter: Payer: Self-pay | Admitting: *Deleted

## 2015-08-02 DIAGNOSIS — Z833 Family history of diabetes mellitus: Secondary | ICD-10-CM | POA: Diagnosis not present

## 2015-08-02 DIAGNOSIS — K5730 Diverticulosis of large intestine without perforation or abscess without bleeding: Secondary | ICD-10-CM | POA: Diagnosis not present

## 2015-08-02 DIAGNOSIS — Z880 Allergy status to penicillin: Secondary | ICD-10-CM | POA: Insufficient documentation

## 2015-08-02 DIAGNOSIS — K449 Diaphragmatic hernia without obstruction or gangrene: Secondary | ICD-10-CM | POA: Insufficient documentation

## 2015-08-02 DIAGNOSIS — K6289 Other specified diseases of anus and rectum: Secondary | ICD-10-CM | POA: Insufficient documentation

## 2015-08-02 DIAGNOSIS — E1142 Type 2 diabetes mellitus with diabetic polyneuropathy: Secondary | ICD-10-CM | POA: Diagnosis not present

## 2015-08-02 DIAGNOSIS — I509 Heart failure, unspecified: Secondary | ICD-10-CM | POA: Diagnosis not present

## 2015-08-02 DIAGNOSIS — I129 Hypertensive chronic kidney disease with stage 1 through stage 4 chronic kidney disease, or unspecified chronic kidney disease: Secondary | ICD-10-CM | POA: Diagnosis not present

## 2015-08-02 DIAGNOSIS — E7800 Pure hypercholesterolemia, unspecified: Secondary | ICD-10-CM | POA: Insufficient documentation

## 2015-08-02 DIAGNOSIS — E785 Hyperlipidemia, unspecified: Secondary | ICD-10-CM | POA: Insufficient documentation

## 2015-08-02 DIAGNOSIS — Z8249 Family history of ischemic heart disease and other diseases of the circulatory system: Secondary | ICD-10-CM | POA: Insufficient documentation

## 2015-08-02 DIAGNOSIS — Z79899 Other long term (current) drug therapy: Secondary | ICD-10-CM | POA: Insufficient documentation

## 2015-08-02 DIAGNOSIS — K922 Gastrointestinal hemorrhage, unspecified: Secondary | ICD-10-CM | POA: Insufficient documentation

## 2015-08-02 DIAGNOSIS — N390 Urinary tract infection, site not specified: Secondary | ICD-10-CM | POA: Diagnosis not present

## 2015-08-02 DIAGNOSIS — Z9049 Acquired absence of other specified parts of digestive tract: Secondary | ICD-10-CM | POA: Insufficient documentation

## 2015-08-02 DIAGNOSIS — R109 Unspecified abdominal pain: Secondary | ICD-10-CM | POA: Diagnosis not present

## 2015-08-02 DIAGNOSIS — N183 Chronic kidney disease, stage 3 (moderate): Secondary | ICD-10-CM | POA: Diagnosis not present

## 2015-08-02 DIAGNOSIS — K921 Melena: Secondary | ICD-10-CM | POA: Diagnosis not present

## 2015-08-02 DIAGNOSIS — Z794 Long term (current) use of insulin: Secondary | ICD-10-CM | POA: Insufficient documentation

## 2015-08-02 DIAGNOSIS — Z6838 Body mass index (BMI) 38.0-38.9, adult: Secondary | ICD-10-CM | POA: Diagnosis not present

## 2015-08-02 DIAGNOSIS — E1122 Type 2 diabetes mellitus with diabetic chronic kidney disease: Secondary | ICD-10-CM | POA: Diagnosis not present

## 2015-08-02 DIAGNOSIS — E890 Postprocedural hypothyroidism: Secondary | ICD-10-CM | POA: Diagnosis not present

## 2015-08-02 LAB — COMPREHENSIVE METABOLIC PANEL
ALBUMIN: 4.2 g/dL (ref 3.5–5.0)
ALK PHOS: 37 U/L — AB (ref 38–126)
ALT: 32 U/L (ref 14–54)
ANION GAP: 9 (ref 5–15)
AST: 34 U/L (ref 15–41)
BUN: 34 mg/dL — ABNORMAL HIGH (ref 6–20)
CALCIUM: 9.4 mg/dL (ref 8.9–10.3)
CHLORIDE: 102 mmol/L (ref 101–111)
CO2: 28 mmol/L (ref 22–32)
Creatinine, Ser: 1.35 mg/dL — ABNORMAL HIGH (ref 0.44–1.00)
GFR calc non Af Amer: 37 mL/min — ABNORMAL LOW (ref >60)
GFR, EST AFRICAN AMERICAN: 43 mL/min — AB (ref >60)
Glucose, Bld: 217 mg/dL — ABNORMAL HIGH (ref 65–99)
POTASSIUM: 4.6 mmol/L (ref 3.5–5.1)
SODIUM: 139 mmol/L (ref 135–145)
Total Bilirubin: 1 mg/dL (ref 0.3–1.2)
Total Protein: 7.6 g/dL (ref 6.5–8.1)

## 2015-08-02 LAB — LIPASE, BLOOD: LIPASE: 44 U/L (ref 11–51)

## 2015-08-02 LAB — CBC
HEMATOCRIT: 38.3 % (ref 35.0–47.0)
HEMOGLOBIN: 12.5 g/dL (ref 12.0–16.0)
MCH: 29.2 pg (ref 26.0–34.0)
MCHC: 32.6 g/dL (ref 32.0–36.0)
MCV: 89.5 fL (ref 80.0–100.0)
Platelets: 188 10*3/uL (ref 150–440)
RBC: 4.29 MIL/uL (ref 3.80–5.20)
RDW: 14.1 % (ref 11.5–14.5)
WBC: 10.7 10*3/uL (ref 3.6–11.0)

## 2015-08-02 NOTE — ED Notes (Signed)
Pt to triage via wheelchair.  Pt reports rectal bleeding and abd pain.  2 episodes of diarrhea tonight with bleeding.  Pt reports feeling weak.  Hx of rectal bleeding.

## 2015-08-03 ENCOUNTER — Observation Stay
Admission: EM | Admit: 2015-08-03 | Discharge: 2015-08-04 | Disposition: A | Discharge status: Home / Self Care | Payer: Medicare Other | Attending: Internal Medicine | Admitting: Internal Medicine

## 2015-08-03 ENCOUNTER — Encounter: Payer: Self-pay | Admitting: Radiology

## 2015-08-03 ENCOUNTER — Emergency Department: Payer: Medicare Other

## 2015-08-03 DIAGNOSIS — K922 Gastrointestinal hemorrhage, unspecified: Secondary | ICD-10-CM | POA: Diagnosis present

## 2015-08-03 DIAGNOSIS — K921 Melena: Secondary | ICD-10-CM | POA: Diagnosis not present

## 2015-08-03 HISTORY — DX: Gastrointestinal hemorrhage, unspecified: K92.2

## 2015-08-03 HISTORY — DX: Disorder of kidney and ureter, unspecified: N28.9

## 2015-08-03 LAB — URINALYSIS COMPLETE WITH MICROSCOPIC (ARMC ONLY)
BACTERIA UA: NONE SEEN
BILIRUBIN URINE: NEGATIVE
GLUCOSE, UA: 50 mg/dL — AB
HGB URINE DIPSTICK: NEGATIVE
Ketones, ur: NEGATIVE mg/dL
Nitrite: NEGATIVE
PH: 6 (ref 5.0–8.0)
Protein, ur: 30 mg/dL — AB
SPECIFIC GRAVITY, URINE: 1.012 (ref 1.005–1.030)

## 2015-08-03 LAB — GLUCOSE, CAPILLARY
GLUCOSE-CAPILLARY: 257 mg/dL — AB (ref 65–99)
Glucose-Capillary: 230 mg/dL — ABNORMAL HIGH (ref 65–99)
Glucose-Capillary: 221 mg/dL — ABNORMAL HIGH (ref 65–99)
Glucose-Capillary: 128 mg/dL — ABNORMAL HIGH (ref 65–99)
Glucose-Capillary: 127 mg/dL — ABNORMAL HIGH (ref 65–99)
Glucose-Capillary: 176 mg/dL — ABNORMAL HIGH (ref 65–99)

## 2015-08-03 LAB — TSH: TSH: 4.464 u[IU]/mL (ref 0.350–4.500)

## 2015-08-03 LAB — CBC
HCT: 37.3 % (ref 35.0–47.0)
HEMOGLOBIN: 12.2 g/dL (ref 12.0–16.0)
MCH: 29 pg (ref 26.0–34.0)
MCHC: 32.6 g/dL (ref 32.0–36.0)
MCV: 89 fL (ref 80.0–100.0)
Platelets: 184 10*3/uL (ref 150–440)
RBC: 4.2 MIL/uL (ref 3.80–5.20)
RDW: 14.1 % (ref 11.5–14.5)
WBC: 8.2 10*3/uL (ref 3.6–11.0)

## 2015-08-03 MED ORDER — SODIUM CHLORIDE 0.9% FLUSH
3.0000 mL | Freq: Two times a day (BID) | INTRAVENOUS | Status: DC
  Administered 2015-08-03 – 2015-08-04 (×2): 3 mL via INTRAVENOUS

## 2015-08-03 MED ORDER — HYDROCORTISONE ACETATE 25 MG RE SUPP
25.0000 mg | Freq: Once | RECTAL | Status: AC
  Administered 2015-08-03: 25 mg via RECTAL
  Filled 2015-08-03: qty 1

## 2015-08-03 MED ORDER — HYDROCORTISONE ACE-PRAMOXINE 1-1 % RE FOAM
1.0000 | Freq: Three times a day (TID) | RECTAL | Status: DC
  Administered 2015-08-03 – 2015-08-04 (×3): 1 via RECTAL
  Filled 2015-08-03: qty 10

## 2015-08-03 MED ORDER — MECLIZINE HCL 12.5 MG PO TABS: 12.5000 mg | ORAL_TABLET | Freq: Three times a day (TID) | ORAL | Status: DC | PRN

## 2015-08-03 MED ORDER — INSULIN ASPART 100 UNIT/ML ~~LOC~~ SOLN
0.0000 [IU] | Freq: Three times a day (TID) | SUBCUTANEOUS | Status: DC
  Administered 2015-08-03: 2 [IU] via SUBCUTANEOUS
  Administered 2015-08-03: 5 [IU] via SUBCUTANEOUS
  Administered 2015-08-03: 2 [IU] via SUBCUTANEOUS
  Administered 2015-08-04 (×2): 5 [IU] via SUBCUTANEOUS
  Filled 2015-08-03: qty 5
  Filled 2015-08-03 (×2): qty 2
  Filled 2015-08-03 (×2): qty 5

## 2015-08-03 MED ORDER — POLYETHYLENE GLYCOL 3350 17 GM/SCOOP PO POWD: 17.0000 g | Freq: Every day | ORAL | Status: DC

## 2015-08-03 MED ORDER — DEXTROSE 5 % IV SOLN: 1.0000 g | INTRAVENOUS | Status: DC

## 2015-08-03 MED ORDER — POLYETHYLENE GLYCOL 3350 17 G PO PACK
17.0000 g | PACK | Freq: Every day | ORAL | Status: DC
  Administered 2015-08-04 (×2): 17 g via ORAL
  Filled 2015-08-03 (×3): qty 1

## 2015-08-03 MED ORDER — ACETAMINOPHEN 325 MG PO TABS
650.0000 mg | ORAL_TABLET | Freq: Four times a day (QID) | ORAL | Status: DC | PRN
  Administered 2015-08-03: 650 mg via ORAL
  Filled 2015-08-03: qty 2

## 2015-08-03 MED ORDER — DOCUSATE SODIUM 100 MG PO CAPS
100.0000 mg | ORAL_CAPSULE | Freq: Two times a day (BID) | ORAL | Status: DC
  Administered 2015-08-04 (×2): 100 mg via ORAL
  Filled 2015-08-03 (×3): qty 1

## 2015-08-03 MED ORDER — LEVOFLOXACIN IN D5W 250 MG/50ML IV SOLN
250.0000 mg | INTRAVENOUS | Status: DC
  Administered 2015-08-03 – 2015-08-04 (×2): 250 mg via INTRAVENOUS
  Filled 2015-08-03 (×3): qty 50

## 2015-08-03 MED ORDER — INSULIN ASPART 100 UNIT/ML ~~LOC~~ SOLN: 0.0000 [IU] | Freq: Every day | SUBCUTANEOUS | Status: DC

## 2015-08-03 MED ORDER — PANTOPRAZOLE SODIUM 40 MG IV SOLR
40.0000 mg | Freq: Two times a day (BID) | INTRAVENOUS | Status: DC
  Administered 2015-08-03 – 2015-08-04 (×3): 40 mg via INTRAVENOUS
  Filled 2015-08-03 (×4): qty 40

## 2015-08-03 MED ORDER — INSULIN REGULAR HUMAN 100 UNIT/ML IJ SOLN
10.0000 [IU] | Freq: Once | INTRAMUSCULAR | Status: AC
  Administered 2015-08-03: 10 [IU] via SUBCUTANEOUS
  Filled 2015-08-03: qty 0.1

## 2015-08-03 MED ORDER — HEPARIN SODIUM (PORCINE) 5000 UNIT/ML IJ SOLN: 5000.0000 [IU] | Freq: Three times a day (TID) | INTRAMUSCULAR | Status: DC

## 2015-08-03 MED ORDER — ONDANSETRON HCL 4 MG/2ML IJ SOLN: 4.0000 mg | Freq: Four times a day (QID) | INTRAMUSCULAR | Status: DC | PRN

## 2015-08-03 MED ORDER — TORSEMIDE 20 MG PO TABS
20.0000 mg | ORAL_TABLET | Freq: Every day | ORAL | Status: DC
  Administered 2015-08-03 – 2015-08-04 (×2): 20 mg via ORAL
  Filled 2015-08-03 (×2): qty 1

## 2015-08-03 MED ORDER — IRBESARTAN 75 MG PO TABS
300.0000 mg | ORAL_TABLET | Freq: Every day | ORAL | Status: DC
  Administered 2015-08-03 – 2015-08-04 (×2): 300 mg via ORAL
  Filled 2015-08-03 (×2): qty 4

## 2015-08-03 MED ORDER — CLONIDINE HCL 0.1 MG PO TABS
0.2000 mg | ORAL_TABLET | Freq: Two times a day (BID) | ORAL | Status: DC
  Administered 2015-08-03 – 2015-08-04 (×2): 0.2 mg via ORAL
  Filled 2015-08-03 (×3): qty 2

## 2015-08-03 MED ORDER — VERAPAMIL HCL ER 240 MG PO TBCR: 240.0000 mg | EXTENDED_RELEASE_TABLET | Freq: Every day | ORAL | Status: DC

## 2015-08-03 MED ORDER — INSULIN DETEMIR 100 UNIT/ML ~~LOC~~ SOLN
20.0000 [IU] | Freq: Once | SUBCUTANEOUS | Status: AC
  Administered 2015-08-03: 20 [IU] via SUBCUTANEOUS
  Filled 2015-08-03: qty 0.2

## 2015-08-03 MED ORDER — DIPHENHYDRAMINE HCL 50 MG/ML IJ SOLN: 12.5000 mg | Freq: Three times a day (TID) | INTRAMUSCULAR | Status: DC | PRN

## 2015-08-03 MED ORDER — IOHEXOL 240 MG/ML SOLN
25.0000 mL | Freq: Once | INTRAMUSCULAR | Status: AC | PRN
  Administered 2015-08-03: 25 mL via ORAL

## 2015-08-03 MED ORDER — ROSUVASTATIN CALCIUM 10 MG PO TABS
10.0000 mg | ORAL_TABLET | Freq: Every day | ORAL | Status: DC
  Administered 2015-08-03 – 2015-08-04 (×2): 10 mg via ORAL
  Filled 2015-08-03 (×2): qty 1

## 2015-08-03 MED ORDER — SPIRONOLACTONE 25 MG PO TABS
25.0000 mg | ORAL_TABLET | Freq: Every day | ORAL | Status: DC
  Administered 2015-08-03 – 2015-08-04 (×2): 25 mg via ORAL
  Filled 2015-08-03 (×2): qty 1

## 2015-08-03 MED ORDER — SODIUM CHLORIDE 0.9 % IV SOLN
INTRAVENOUS | Status: DC
  Administered 2015-08-03 – 2015-08-04 (×4): via INTRAVENOUS

## 2015-08-03 MED ORDER — ONDANSETRON HCL 4 MG PO TABS: 4.0000 mg | ORAL_TABLET | Freq: Four times a day (QID) | ORAL | Status: DC | PRN

## 2015-08-03 MED ORDER — IOHEXOL 300 MG/ML  SOLN
100.0000 mL | Freq: Once | INTRAMUSCULAR | Status: AC | PRN
  Administered 2015-08-03: 100 mL via INTRAVENOUS

## 2015-08-03 MED ORDER — METOPROLOL TARTRATE 50 MG PO TABS
50.0000 mg | ORAL_TABLET | Freq: Two times a day (BID) | ORAL | Status: DC
  Administered 2015-08-03 – 2015-08-04 (×2): 50 mg via ORAL
  Filled 2015-08-03 (×2): qty 1

## 2015-08-03 MED ORDER — ALPRAZOLAM 0.5 MG PO TABS
0.5000 mg | ORAL_TABLET | Freq: Every evening | ORAL | Status: DC | PRN
Start: 2015-08-03 — End: 2015-08-04

## 2015-08-03 MED ORDER — HYDROCODONE-ACETAMINOPHEN 5-325 MG PO TABS
1.0000 | ORAL_TABLET | Freq: Four times a day (QID) | ORAL | Status: DC | PRN
  Administered 2015-08-03 – 2015-08-04 (×2): 1 via ORAL
  Filled 2015-08-03 (×2): qty 1

## 2015-08-03 MED ORDER — HYDROCORTISONE ACE-PRAMOXINE 2.5-1 % RE CREA
TOPICAL_CREAM | Freq: Three times a day (TID) | RECTAL | Status: DC
  Filled 2015-08-03: qty 30

## 2015-08-03 NOTE — ED Notes (Signed)
MD at bedside. 

## 2015-08-03 NOTE — Consult Note (Signed)
GI Inpatient Consult Note  Reason for Consult: Dr. Verdell Carmine    Attending Requesting Consult: GI Bleed  History of Present Illness: Alexis Farmer is a 75 y.o. female seen for evaluation of GI Bleed at the request of Dr. Verdell Carmine. PMHx of DM, HTN, HLD.   New patient to [facility]. Admitted for rectal bleeding. She reports about 2 weeks ago developing diarrhea w/ 2-3 loose stools/day. She denies any bleeding at that time. Diarrhea resolved for a few days then restarted about 4-5 days ago. Associated w/ lower abd pain which is difficult for her to describe but appears to be more related to movement than eating. Also radiates from flank area and to under breast area. Yesterday AM developed BRBPR which prompted her to come to hospital. Bleeding has slowed-morning BM had larger amount of blood but 2nd BM today had small amount of blood per RN. She has painful defecation, sharp stabbing pain in rectum area. Occuring since diarrhea began.   Pain radiating from back to underneath her breast, no relation to PO intake. Occurs w/ movement.   Last Colonoscopy: Approximately 2015 in [place]-unable to locate records through [facility].    Last Endoscopy: None prior    Past Medical History:  Past Medical History  Diagnosis Date  . Diabetes mellitus without complication ([place])   . Hypertension   . High cholesterol   . Renal insufficiency   . GI bleed     Problem List: Patient Active Problem List   Diagnosis Date Noted  . GI bleed 08/03/2015  . Hyperlipidemia 08/26/2014  . Essential hypertension, benign 04/08/2014  . Diabetes mellitus type II, uncontrolled ([place]) 04/08/2014  . CKD (chronic kidney disease) stage 3, GFR 30-59 ml/min 04/08/2014  . Pedal edema 04/08/2014  . Morbid obesity ([place]) 04/08/2014  . Lower back pain 04/08/2014  . Diabetic polyneuropathy ([place]) 04/08/2014    Past Surgical History: Past Surgical History  Procedure Laterality Date  . Ectopic pregnancy surgery    . Gallbladder     .  Thyroidectomy, partial    . Appendectomy      Allergies: Allergies  Allergen Reactions  . Penicillins Hives, Swelling and Other (See Comments)    Has patient had a PCN reaction causing immediate rash, facial/tongue/throat swelling, SOB or lightheadedness with hypotension: Yes Has patient had a PCN reaction causing severe rash involving mucus membranes or skin necrosis: No Has patient had a PCN reaction that required hospitalization No Has patient had a PCN reaction occurring within the last 10 years: Yes If all of the above answers are "NO", then may proceed with Cephalosporin use.     Home Medications: Prescriptions prior to admission  Medication Sig Dispense Refill Last Dose  . ALPRAZolam (XANAX) 0.5 MG tablet Take 0.5 mg by mouth at bedtime as needed for sleep.    prn at prn  . cloNIDine (CATAPRES) 0.2 MG tablet Take 1 tablet (0.2 mg total) by mouth 2 (two) times daily. 180 tablet 4 08/02/2015 at Unknown time  . insulin regular (NOVOLIN R) 100 units/mL injection Inject 0.15 mLs (15 Units total) into the skin 2 (two) times daily before a meal. (Patient taking differently: Inject 10 Units into the skin 3 (three) times daily before meals. ) 10 mL 0 08/02/2015 at Unknown time  . meclizine (ANTIVERT) 12.5 MG tablet Take 12.5 mg by mouth 3 (three) times daily as needed for dizziness. Reported on 08/03/2015   prn at prn  . metoprolol (LOPRESSOR) 50 MG tablet Take 1 tablet (50  mg total) by mouth 2 (two) times daily. 180 tablet 4 08/02/2015 at 1730  . Multiple Vitamin (MULTIVITAMIN WITH MINERALS) TABS tablet Take 1 tablet by mouth daily.   08/02/2015 at Unknown time  . NOVOLIN N 100 UNIT/ML injection Inject 20-25 Units into the skin 2 (two) times daily before a meal. Takes 25 units am and 20 units pm daily.   08/02/2015 at Unknown time  . polyethylene glycol powder (GLYCOLAX/MIRALAX) powder Take 17 g by mouth daily.    Past Week at Unknown time  . rosuvastatin (CRESTOR) 10 MG tablet Take 1 tablet (10 mg  total) by mouth daily. 90 tablet 4 08/02/2015 at Unknown time  . spironolactone (ALDACTONE) 25 MG tablet Take 25 mg by mouth daily.   08/02/2015 at Unknown time  . torsemide (DEMADEX) 20 MG tablet Take 1 tablet (20 mg total) by mouth daily. 90 tablet 4 08/02/2015 at Unknown time  . valsartan (DIOVAN) 320 MG tablet Take 1 tablet (320 mg total) by mouth daily. 90 tablet 3 08/02/2015 at Unknown time  . verapamil (VERELAN PM) 240 MG 24 hr capsule Take 240 mg by mouth at bedtime.   08/02/2015 at Unknown time   Home medication reconciliation was completed with the patient.   Scheduled Inpatient Medications:   . cloNIDine  0.2 mg Oral BID  . docusate sodium  100 mg Oral BID  . insulin aspart  0-15 Units Subcutaneous TID WC  . insulin aspart  0-5 Units Subcutaneous QHS  . irbesartan  300 mg Oral Daily  . levofloxacin (LEVAQUIN) IV  250 mg Intravenous Q24H  . metoprolol  50 mg Oral BID  . pantoprazole (PROTONIX) IV  40 mg Intravenous Q12H  . polyethylene glycol  17 g Oral Daily  . rosuvastatin  10 mg Oral Daily  . sodium chloride flush  3 mL Intravenous Q12H  . spironolactone  25 mg Oral Daily  . torsemide  20 mg Oral Daily  . verapamil  240 mg Oral QHS    Continuous Inpatient Infusions:   . sodium chloride 150 mL/hr at 08/03/15 0755    PRN Inpatient Medications:  acetaminophen, ALPRAZolam, diphenhydrAMINE, meclizine, ondansetron **OR** ondansetron (ZOFRAN) IV  Family History: family history includes Diabetes in her mother, other, and sister; Heart disease in her sister; Hypertension in her mother.  She denies known history of colon polyps or CRC.  Social History:  The patient denies ETOH, tobacco, or drug use.   Review of Systems: Constitutional: Weight is stable.  Eyes: No changes in vision. ENT: No oral lesions, sore throat.  GI: see HPI.  Heme/Lymph: No easy bruising.  CV: No chest pain.  GU: No hematuria.  Integumentary: No rashes.  Neuro: No headaches.  Psych: No  depression/anxiety.  Endocrine: No heat/cold intolerance.  Allergic/Immunologic: No urticaria.  Resp: No cough, SOB.  Musculoskeletal: + back pain.    Physical Examination: BP 131/56 mmHg  Pulse 61  Temp(Src) 98.3 F (36.8 C) (Oral)  Resp 19  Ht 5\' 3"  (1.6 m)  Wt 272 lb (123.378 kg)  BMI 48.19 kg/m2  SpO2 99% Gen: NAD, alert and oriented x 4 HEENT: PEERLA, EOMI, Neck: supple, no JVD or thyromegaly Chest: CTA bilaterally, no wheezes, crackles, or other adventitious sounds CV: RRR, no m/g/c/r Abd: soft, NT, ND, +BS in all four quadrants; morbidly obese Rectal - difficult to assess based on body habitus. Tender on exam. Small external hemorrhoid non thrombosed.  Ext: no edema, well perfused with 2+ pulses Skin: no rash or  lesions noted Lymph: no LAD  Data: Lab Results  Component Value Date   WBC 10.7 08/02/2015   HGB 12.5 08/02/2015   HCT 38.3 08/02/2015   MCV 89.5 08/02/2015   PLT 188 08/02/2015    Recent Labs Lab 08/02/15 2327  HGB 12.5   Lab Results  Component Value Date   NA 139 08/02/2015   K 4.6 08/02/2015   CL 102 08/02/2015   CO2 28 08/02/2015   BUN 34* 08/02/2015   CREATININE 1.35* 08/02/2015   GLU 183 03/11/2014   Lab Results  Component Value Date   ALT 32 08/02/2015   AST 34 08/02/2015   ALKPHOS 37* 08/02/2015   BILITOT 1.0 08/02/2015   No results for input(s): APTT, INR, PTT in the last 168 hours.   CT a/p IMPRESSION: 08/03/15 No acute process demonstrated in the abdomen or pelvis. No evidence of bowel obstruction or inflammation. Diffuse fatty infiltration of the liver. + sigmoid diverticulosis.    Assessment/Plan: Ms. Salser is a 75 y.o. female admitted for rectal bleeding.   1. Rectal bleeding - Hgb stable. Differential includes diverticular bleed (resolving) vs. anal outlet bleeding. CT a/p without evidence of ischemic colitis. Last colonoscopy 2015 w/ diverticulosis per patient. She reports a history of GI bleed x 2 from  diverticulosis in past. Rectal exam not impressive, brown stool in rectal vault. Recommend monitoring H/H. Consider colonoscopy as outpatient after locating her records.    2. Diarrhea - x 2 weeks but slowing with 2 formed stools/day. Consider viral etiology. Stool studies low yield at this point.   3. Anal pain - consider fissure vs. Hemorrhoids. Difficult exam 2/2 body habits. Try anal pram cream, if no improvement consider diltiazem/lidocaine.   4. Abd pain/flank pain - radiating from back to under breast. Occurs w/ movement. F/up with ortho as outpatient. Abd CT unremarkable.   Recommendations:  1. Repeat CBC  2. Start clear liquid diet  3. Monitor H/H.  4. Analpram cream.  5. Request colonoscopy records.   Case discussed w/ Dr. Candace Cruise. Thank you for the consult. Please call with questions or concerns.  Ronney Asters, PA-C [place]

## 2015-08-03 NOTE — Care Management Obs Status (Signed)
[place] NOTIFICATION   Patient Details  Name: Alexis Farmer MRN: [id number] Date of Birth: 06/10/1941   Medicare Observation Status Notification Given:  Yes (code 35 given ) reviewed with patient.  Signed and sent to HIM    Beverly Sessions, RN 08/03/2015, 2:34 PM

## 2015-08-03 NOTE — Progress Notes (Signed)
Per Dr. Verdell Carmine okay to place order for Anusol suppository that pt got in ED. Same dosage as before.

## 2015-08-03 NOTE — ED Notes (Signed)
Pt is finished drinking her contrast.

## 2015-08-03 NOTE — Consult Note (Signed)
  Pt seen and examined. Please see Alexis Farmer' notes. This is the 4th time with rectal bleeding. On 2 occasions, pt required blood transfusions. Last colonoscopy either in 2015 or 2016. Do not have records to confirm but pt likely had diverticulosis based on pt's hx. Bleeding is already resolving. Would start liquid diet and then solids if no further bleeding. Discharge to home if bleeding stops completely. Thanks.

## 2015-08-03 NOTE — Progress Notes (Signed)
Pt has no orders for pain medications with complaints of pain. Per Dr. Verdell Carmine place order for tylenol 650mg  oral q 6 hours.

## 2015-08-03 NOTE — Progress Notes (Signed)
Pharmacy Antibiotic Note  Alexis Farmer is a 75 y.o. female admitted on 08/03/2015 with UTI.  Pharmacy has been consulted for levofloxacin dosing.  Plan: Will order levofloxacin 250 m gIV q24h.   Height: 5\' 3"  (160 cm) Weight: 272 lb (123.378 kg) IBW/kg (Calculated) : 52.4  Temp (24hrs), Avg:98.2 F (36.8 C), Min:98.1 F (36.7 C), Max:98.2 F (36.8 C)   Recent Labs Lab 08/02/15 2327  WBC 10.7  CREATININE 1.35*    Estimated Creatinine Clearance: 45.2 mL/min (by C-G formula based on Cr of 1.35).    Allergies  Allergen Reactions  . Penicillins Hives, Swelling and Other (See Comments)    Has patient had a PCN reaction causing immediate rash, facial/tongue/throat swelling, SOB or lightheadedness with hypotension: Yes Has patient had a PCN reaction causing severe rash involving mucus membranes or skin necrosis: No Has patient had a PCN reaction that required hospitalization No Has patient had a PCN reaction occurring within the last 10 years: Yes If all of the above answers are "NO", then may proceed with Cephalosporin use.     Antimicrobials this admission: Levaquin 2/15>>   Microbiology results:  UCx: sent   Thank you for allowing pharmacy to be a part of this patient's care.  Rocky Morel 08/03/2015 10:24 AM

## 2015-08-03 NOTE — H&P (Signed)
Alexis Farmer is an 75 y.o. female.   Chief Complaint: Blood in stool HPI: The patient presents to the emergency department complaining of bloody bowel movements. She states that she had at least 2 large bright red bowel movements today that were painless. She denies abdominal pain, nausea or vomiting. She denies fevers, shortness of breath or chest pain. She admits that she has had episodes of diverticulitis in the past that resulted in bleeding that required blood transfusion. In the emergency department she she was found to heme positive stools. Due to her history of GI bleeding emergency department staff called the hospitalist service for admission.  Past Medical History  Diagnosis Date  . Diabetes mellitus without complication ([place])   . Hypertension   . High cholesterol   . Renal insufficiency   . GI bleed     Past Surgical History  Procedure Laterality Date  . Ectopic pregnancy surgery    . Gallbladder     . Thyroidectomy, partial    . Appendectomy      Family History  Problem Relation Age of Onset  . Diabetes Mother   . Hypertension Mother   . Diabetes Sister   . Heart disease Sister   . Diabetes Other    Social History:  reports that she has never smoked. She has never used smokeless tobacco. She reports that she does not drink alcohol or use illicit drugs.  Allergies:  Allergies  Allergen Reactions  . Penicillins Hives, Swelling and Other (See Comments)    Has patient had a PCN reaction causing immediate rash, facial/tongue/throat swelling, SOB or lightheadedness with hypotension: Yes Has patient had a PCN reaction causing severe rash involving mucus membranes or skin necrosis: No Has patient had a PCN reaction that required hospitalization No Has patient had a PCN reaction occurring within the last 10 years: Yes If all of the above answers are "NO", then may proceed with Cephalosporin use.     Medications Prior to Admission  Medication Sig Dispense Refill  .  ALPRAZolam (XANAX) 0.5 MG tablet Take 0.5 mg by mouth at bedtime as needed for sleep.     . cloNIDine (CATAPRES) 0.2 MG tablet Take 1 tablet (0.2 mg total) by mouth 2 (two) times daily. 180 tablet 4  . insulin regular (NOVOLIN R) 100 units/mL injection Inject 0.15 mLs (15 Units total) into the skin 2 (two) times daily before a meal. (Patient taking differently: Inject 10 Units into the skin 3 (three) times daily before meals. ) 10 mL 0  . meclizine (ANTIVERT) 12.5 MG tablet Take 12.5 mg by mouth 3 (three) times daily as needed for dizziness. Reported on 08/03/2015    . metoprolol (LOPRESSOR) 50 MG tablet Take 1 tablet (50 mg total) by mouth 2 (two) times daily. 180 tablet 4  . Multiple Vitamin (MULTIVITAMIN WITH MINERALS) TABS tablet Take 1 tablet by mouth daily.    Marland Kitchen NOVOLIN N 100 UNIT/ML injection Inject 20-25 Units into the skin 2 (two) times daily before a meal. Takes 25 units am and 20 units pm daily.    . polyethylene glycol powder (GLYCOLAX/MIRALAX) powder Take 17 g by mouth daily.     . rosuvastatin (CRESTOR) 10 MG tablet Take 1 tablet (10 mg total) by mouth daily. 90 tablet 4  . spironolactone (ALDACTONE) 25 MG tablet Take 25 mg by mouth daily.    Marland Kitchen torsemide (DEMADEX) 20 MG tablet Take 1 tablet (20 mg total) by mouth daily. 90 tablet 4  . valsartan (DIOVAN)  320 MG tablet Take 1 tablet (320 mg total) by mouth daily. 90 tablet 3  . verapamil (VERELAN PM) 240 MG 24 hr capsule Take 240 mg by mouth at bedtime.      Results for orders placed or performed during the hospital encounter of 08/03/15 (from the past 48 hour(s))  Comprehensive metabolic panel     Status: Abnormal   Collection Time: 08/02/15 11:27 PM  Result Value Ref Range   Sodium 139 135 - 145 mmol/L   Potassium 4.6 3.5 - 5.1 mmol/L   Chloride 102 101 - 111 mmol/L   CO2 28 22 - 32 mmol/L   Glucose, Bld 217 (H) 65 - 99 mg/dL   BUN 34 (H) 6 - 20 mg/dL   Creatinine, Ser 1.35 (H) 0.44 - 1.00 mg/dL   Calcium 9.4 8.9 - 10.3 mg/dL    Total Protein 7.6 6.5 - 8.1 g/dL   Albumin 4.2 3.5 - 5.0 g/dL   AST 34 15 - 41 U/L   ALT 32 14 - 54 U/L   Alkaline Phosphatase 37 (L) 38 - 126 U/L   Total Bilirubin 1.0 0.3 - 1.2 mg/dL   GFR calc non Af Amer 37 (L) >60 mL/min   GFR calc Af Amer 43 (L) >60 mL/min    Comment: (NOTE) The eGFR has been calculated using the CKD EPI equation. This calculation has not been validated in all clinical situations. eGFR's persistently <60 mL/min signify possible Chronic Kidney Disease.    Anion gap 9 5 - 15  CBC     Status: None   Collection Time: 08/02/15 11:27 PM  Result Value Ref Range   WBC 10.7 3.6 - 11.0 K/uL   RBC 4.29 3.80 - 5.20 MIL/uL   Hemoglobin 12.5 12.0 - 16.0 g/dL   HCT 38.3 35.0 - 47.0 %   MCV 89.5 80.0 - 100.0 fL   MCH 29.2 26.0 - 34.0 pg   MCHC 32.6 32.0 - 36.0 g/dL   RDW 14.1 11.5 - 14.5 %   Platelets 188 150 - 440 K/uL  Lipase, blood     Status: None   Collection Time: 08/02/15 11:27 PM  Result Value Ref Range   Lipase 44 11 - 51 U/L  Urinalysis complete, with microscopic ([facility] only)     Status: Abnormal   Collection Time: 08/02/15 11:37 PM  Result Value Ref Range   Color, Urine YELLOW (A) YELLOW   APPearance CLEAR (A) CLEAR   Glucose, UA 50 (A) NEGATIVE mg/dL   Bilirubin Urine NEGATIVE NEGATIVE   Ketones, ur NEGATIVE NEGATIVE mg/dL   Specific Gravity, Urine 1.012 1.005 - 1.030   Hgb urine dipstick NEGATIVE NEGATIVE   pH 6.0 5.0 - 8.0   Protein, ur 30 (A) NEGATIVE mg/dL   Nitrite NEGATIVE NEGATIVE   Leukocytes, UA 2+ (A) NEGATIVE   RBC / HPF 0-5 0 - 5 RBC/hpf   WBC, UA 6-30 0 - 5 WBC/hpf   Bacteria, UA NONE SEEN NONE SEEN   Squamous Epithelial / LPF 0-5 (A) NONE SEEN   Hyaline Casts, UA PRESENT   Glucose, capillary     Status: Abnormal   Collection Time: 08/03/15  3:47 AM  Result Value Ref Range   Glucose-Capillary 257 (H) 65 - 99 mg/dL  Glucose, capillary     Status: Abnormal   Collection Time: 08/03/15  6:11 AM  Result Value Ref Range    Glucose-Capillary 230 (H) 65 - 99 mg/dL   Ct Abdomen Pelvis W Contrast  08/03/2015  CLINICAL DATA:  Rectal bleeding and abdominal pain. Diarrhea. Weakness. Left lower quadrant pain. EXAM: CT ABDOMEN AND PELVIS WITH CONTRAST TECHNIQUE: Multidetector CT imaging of the abdomen and pelvis was performed using the standard protocol following bolus administration of intravenous contrast. CONTRAST:  116m OMNIPAQUE IOHEXOL 300 MG/ML  SOLN COMPARISON:  None. FINDINGS: The lung bases are clear.  Small esophageal hiatal hernia. Diffuse fatty infiltration of the liver. Surgical absence of the gallbladder. No bile duct dilatation. The pancreas, spleen, adrenal glands, kidneys, inferior vena cava, and retroperitoneal lymph nodes are unremarkable. Calcification of the abdominal aorta without aneurysm. Stomach, small bowel, and colon are not abnormally distended. Stool fills the colon. No free air or free fluid in the abdomen. Abdominal wall musculature appears intact. Pelvis: Scattered diverticula in the sigmoid colon. No evidence of diverticulitis. Uterus and ovaries are not enlarged. No pelvic mass or lymphadenopathy. Bladder wall is not thickened. No free or loculated pelvic fluid collections. Appendix is surgically absent. Diffuse degenerative changes throughout the spine. Slight anterior subluxation of L4 on L5 is likely degenerative. IMPRESSION: No acute process demonstrated in the abdomen or pelvis. No evidence of bowel obstruction or inflammation. Diffuse fatty infiltration of the liver. Electronically Signed   By: WLucienne CapersM.D.   On: 08/03/2015 02:30    Review of Systems  Constitutional: Negative for fever and chills.  HENT: Negative for sore throat and tinnitus.   Eyes: Negative for blurred vision and redness.  Respiratory: Negative for cough and shortness of breath.   Cardiovascular: Negative for chest pain, palpitations, orthopnea and PND.  Gastrointestinal: Positive for blood in stool. Negative for  nausea, vomiting, abdominal pain and diarrhea.  Genitourinary: Positive for flank pain. Negative for dysuria, urgency and frequency.       Pelvic pressure  Musculoskeletal: Negative for myalgias and joint pain.  Skin: Negative for rash.       No lesions  Neurological: Negative for speech change, focal weakness and weakness.  Endo/Heme/Allergies: Does not bruise/bleed easily.       No temperature intolerance  Psychiatric/Behavioral: Negative for depression and suicidal ideas.    Blood pressure 155/75, pulse 67, temperature 98.1 F (36.7 C), temperature source Oral, resp. rate 18, height '5\' 3"'  (1.6 m), weight 123.378 kg (272 lb), SpO2 98 %. Physical Exam  Vitals reviewed. Constitutional: She is oriented to person, place, and time. She appears well-developed and well-nourished. No distress.  HENT:  Head: Normocephalic and atraumatic.  Mouth/Throat: Oropharynx is clear and moist.  Eyes: Conjunctivae and EOM are normal. Pupils are equal, round, and reactive to light. No scleral icterus.  Neck: Normal range of motion. Neck supple. No JVD present. No tracheal deviation present. No thyromegaly present.  Cardiovascular: Normal rate, regular rhythm and normal heart sounds.  Exam reveals no gallop and no friction rub.   No murmur heard. Respiratory: Effort normal and breath sounds normal. No respiratory distress.  GI: Soft. Bowel sounds are normal. She exhibits no distension. There is no tenderness.  Genitourinary:  Deferred  Lymphadenopathy:    She has no cervical adenopathy.  Neurological: She is alert and oriented to person, place, and time. No cranial nerve deficit. She exhibits normal muscle tone.  Skin: Skin is warm and dry. No rash noted. No erythema.  Psychiatric: She has a normal mood and affect. Her behavior is normal. Judgment and thought content normal.     Assessment/Plan  this is a 75year old African American female admitted for GI bleeding. 1. GI bleed: Likely  lower given  bright red bleeding per rectum. The patient is hemodynamically stable. Hemoglobin is within normal limits. I have made the patient nothing by mouth and gastroenterology will consult in the morning. Continue Protonix drip 2. Essential hypertension: Continue metoprolol, verapamil, losartan and clonidine 3. Diabetes mellitus type 2: Continue prandial insulin by sliding scale. 4. Congestive heart failure: With preserved ejection fraction; continue torsemide and spironolactone 5. UTI: The patient endorses symptoms of pelvic pressure and increased frequency. Start ceftriaxone 6. Hyperlipidemia: Continue statin therapy 7. DVT prophylaxis: Heparin 8. GI prophylaxis: Pantoprazole drip The patient is a full code. Time spent on admission orders and patient care approximately 45 minutes  Harrie Foreman, MD 08/03/2015, 6:52 AM

## 2015-08-03 NOTE — ED Provider Notes (Signed)
[facility] Emergency Department Provider Note  ____________________________________________  Time seen: 1:20 AM  I have reviewed the triage vital signs and the nursing notes.   HISTORY  Chief Complaint Rectal Bleeding     HPI Alexis Farmer is a 75 y.o. female presents with rectal bleeding and abdominal pain onset tonight. Patient states she's had 2 bowel movement of "nothing but blood" tonight before presenting to the emergency department she admits to a history of hemorrhoids in the past. Patient states her current pain score is 5 out of 10 and located in the left side of her abdomen. Patient denies any dizziness no nausea or vomiting     Past Medical History  Diagnosis Date  . Diabetes mellitus without complication ([place])   . Hypertension   . High cholesterol   . Renal insufficiency     Patient Active Problem List   Diagnosis Date Noted  . Hyperlipidemia 08/26/2014  . Essential hypertension, benign 04/08/2014  . Diabetes mellitus type II, uncontrolled ([place]) 04/08/2014  . CKD (chronic kidney disease) stage 3, GFR 30-59 ml/min 04/08/2014  . Pedal edema 04/08/2014  . Morbid obesity ([place]) 04/08/2014  . Lower back pain 04/08/2014  . Diabetic polyneuropathy ([place]) 04/08/2014    Past Surgical History  Procedure Laterality Date  . Ectopic pregnancy surgery    . Gallbladder     . Thyroidectomy, partial    . Appendectomy      Current Outpatient Rx  Name  Route  Sig  Dispense  Refill  . ALPRAZolam (XANAX) 0.5 MG tablet   Oral   Take 0.5 mg by mouth as needed for anxiety.          . cloNIDine (CATAPRES) 0.2 MG tablet   Oral   Take 1 tablet (0.2 mg total) by mouth 2 (two) times daily.   180 tablet   4   . insulin regular (NOVOLIN R) 100 units/mL injection   Subcutaneous   Inject 0.15 mLs (15 Units total) into the skin 2 (two) times daily before a meal. Patient taking differently: Inject 15 Units into the skin 3 (three) times daily  before meals.    10 mL   0   . meclizine (ANTIVERT) 12.5 MG tablet   Oral   Take 12.5 mg by mouth 3 (three) times daily as needed for dizziness.         . metoprolol (LOPRESSOR) 50 MG tablet   Oral   Take 1 tablet (50 mg total) by mouth 2 (two) times daily.   180 tablet   4   . Multiple Vitamins-Minerals (CENTRUM ADULTS PO)   Oral   Take by mouth.         Marland Kitchen NOVOLIN N 100 UNIT/ML injection      Takes 35 units am and 20 units pm daily.         . polyethylene glycol powder (GLYCOLAX/MIRALAX) powder   Oral   Take 1 Container by mouth daily.          . rosuvastatin (CRESTOR) 10 MG tablet   Oral   Take 1 tablet (10 mg total) by mouth daily.   90 tablet   4   . spironolactone (ALDACTONE) 25 MG tablet   Oral   Take 25 mg by mouth daily.         Marland Kitchen torsemide (DEMADEX) 20 MG tablet   Oral   Take 1 tablet (20 mg total) by mouth daily.   90 tablet   4   .  valsartan (DIOVAN) 320 MG tablet   Oral   Take 1 tablet (320 mg total) by mouth daily.   90 tablet   3   . verapamil (VERELAN PM) 240 MG 24 hr capsule   Oral   Take 240 mg by mouth at bedtime.           Allergies Penicillins  Family History  Problem Relation Age of Onset  . Diabetes Mother   . Hypertension Mother   . Diabetes Sister   . Heart disease Sister   . Diabetes Other     Social History Social History  Substance Use Topics  . Smoking status: Never Smoker   . Smokeless tobacco: Never Used  . Alcohol Use: No    Review of Systems  Constitutional: Negative for fever. Eyes: Negative for visual changes. ENT: Negative for sore throat. Cardiovascular: Negative for chest pain. Respiratory: Negative for shortness of breath. Gastrointestinal: Positive for abdominal pain. Positive for rectal bleeding Genitourinary: Negative for dysuria. Musculoskeletal: Negative for back pain. Skin: Negative for rash. Neurological: Negative for headaches, focal weakness or numbness.   10-point ROS  otherwise negative.  ____________________________________________   PHYSICAL EXAM:  VITAL SIGNS: ED Triage Vitals  Enc Vitals Group     BP 08/02/15 2316 169/73 mmHg     Pulse Rate 08/02/15 2316 79     Resp 08/02/15 2316 20     Temp 08/02/15 2316 98.2 F (36.8 C)     Temp Source 08/02/15 2316 Oral     SpO2 08/02/15 2316 98 %     Weight 08/02/15 2316 272 lb (123.378 kg)     Height 08/02/15 2316 5\' 3"  (1.6 m)     Head Cir --      Peak Flow --      Pain Score 08/02/15 2317 7     Pain Loc --      Pain Edu? --      Excl. in [place]? --      Constitutional: Alert and oriented. Well appearing and in no distress. Eyes: Conjunctivae are normal. PERRL. Normal extraocular movements. ENT   Head: Normocephalic and atraumatic.   Nose: No congestion/rhinnorhea.   Mouth/Throat: Mucous membranes are moist.   Neck: No stridor. Hematological/Lymphatic/Immunilogical: No cervical lymphadenopathy. Cardiovascular: Normal rate, regular rhythm. Normal and symmetric distal pulses are present in all extremities. No murmurs, rubs, or gallops. Respiratory: Normal respiratory effort without tachypnea nor retractions. Breath sounds are clear and equal bilaterally. No wheezes/rales/rhonchi. Gastrointestinal: Soft and nontender. No distention. There is no CVA tenderness. Guaiac positive bright red blood. External hemorrhoids noted no evidence of active bleeding at this time Genitourinary: deferred Musculoskeletal: Nontender with normal range of motion in all extremities. No joint effusions.  No lower extremity tenderness nor edema. Neurologic:  Normal speech and language. No gross focal neurologic deficits are appreciated. Speech is normal.  Skin:  Skin is warm, dry and intact. No rash noted. Psychiatric: Mood and affect are normal. Speech and behavior are normal. Patient exhibits appropriate insight and judgment.  ____________________________________________    LABS (pertinent  positives/negatives)  Labs Reviewed  COMPREHENSIVE METABOLIC PANEL - Abnormal; Notable for the following:    Glucose, Bld 217 (*)    BUN 34 (*)    Creatinine, Ser 1.35 (*)    Alkaline Phosphatase 37 (*)    GFR calc non Af Amer 37 (*)    GFR calc Af Amer 43 (*)    All other components within normal limits  URINALYSIS COMPLETEWITH MICROSCOPIC [facility]  ONLY) - Abnormal; Notable for the following:    Color, Urine YELLOW (*)    APPearance CLEAR (*)    Glucose, UA 50 (*)    Protein, ur 30 (*)    Leukocytes, UA 2+ (*)    Squamous Epithelial / LPF 0-5 (*)    All other components within normal limits  GLUCOSE, CAPILLARY - Abnormal; Notable for the following:    Glucose-Capillary 257 (*)    All other components within normal limits  CBC  LIPASE, BLOOD      RADIOLOGY      CT Abdomen Pelvis W Contrast (Final result) Result time: 08/03/15 02:30:36   Final result by Rad Results In Interface (08/03/15 02:30:36)   Narrative:   CLINICAL DATA: Rectal bleeding and abdominal pain. Diarrhea. Weakness. Left lower quadrant pain.  EXAM: CT ABDOMEN AND PELVIS WITH CONTRAST  TECHNIQUE: Multidetector CT imaging of the abdomen and pelvis was performed using the standard protocol following bolus administration of intravenous contrast.  CONTRAST: 143mL OMNIPAQUE IOHEXOL 300 MG/ML SOLN  COMPARISON: None.  FINDINGS: The lung bases are clear. Small esophageal hiatal hernia.  Diffuse fatty infiltration of the liver. Surgical absence of the gallbladder. No bile duct dilatation. The pancreas, spleen, adrenal glands, kidneys, inferior vena cava, and retroperitoneal lymph nodes are unremarkable. Calcification of the abdominal aorta without aneurysm.  Stomach, small bowel, and colon are not abnormally distended. Stool fills the colon. No free air or free fluid in the abdomen. Abdominal wall musculature appears intact.  Pelvis: Scattered diverticula in the sigmoid colon. No evidence  of diverticulitis. Uterus and ovaries are not enlarged. No pelvic mass or lymphadenopathy. Bladder wall is not thickened. No free or loculated pelvic fluid collections. Appendix is surgically absent. Diffuse degenerative changes throughout the spine. Slight anterior subluxation of L4 on L5 is likely degenerative.  IMPRESSION: No acute process demonstrated in the abdomen or pelvis. No evidence of bowel obstruction or inflammation. Diffuse fatty infiltration of the liver.   Electronically Signed By: Lucienne Capers M.D. On: 08/03/2015 02:30        INITIAL IMPRESSION / ASSESSMENT AND PLAN / ED COURSE  Pertinent labs & imaging results that were available during my care of the patient were reviewed by me and considered in my medical decision making (see chart for details).  Patient hemodynamically stable with lower GI bleed, laboratory data revealed no evidence of anemia however in the setting of acute blood loss. Discussed possible outpatient follow-up with gastroenterology with the patient to which she is supposed. Patient requesting hospital admission  ____________________________________________   FINAL CLINICAL IMPRESSION(S) / ED DIAGNOSES  Final diagnoses:  Lower GI bleed      Gregor Hams, MD 08/03/15 [phone]

## 2015-08-03 NOTE — ED Notes (Signed)
CT tech in room to instruct patient on drinking contrast. Pt verbalized understanding.

## 2015-08-03 NOTE — Progress Notes (Signed)
Inpatient Diabetes Program Recommendations  AACE/ADA: New Consensus Statement on Inpatient Glycemic Control (2015)  Target Ranges:  Prepandial:   less than 140 mg/dL      Peak postprandial:   less than 180 mg/dL (1-2 hours)      Critically ill patients:  140 - 180 mg/dL   Review of Glycemic Control:  Results for LAN, CHANNELL (MRN [id number]) as of 08/03/2015 11:37  Ref. Range 08/03/2015 03:47 08/03/2015 06:11 08/03/2015 07:42 08/03/2015 11:18  Glucose-Capillary Latest Ref Range: 65-99 mg/dL 257 (H) 230 (H) 221 (H) 128 (H)   Diabetes history: Type 2 diabetes Outpatient Diabetes medications: NPH 25 units q AM, NPH 20 units q PM Current orders for Inpatient glycemic control:  Novolog moderate tid with meals HS  Inpatient Diabetes Program Recommendations:    Note that patient is currently NPO.  Patient received Levemir 20 units this AM.  May consider continuing Levemir 15 units once daily.    Thanks, Adah Perl, RN, BC-ADM Inpatient Diabetes Coordinator Pager [phone]

## 2015-08-03 NOTE — Progress Notes (Signed)
[facility] [place] NAME: Alexis Farmer    MR#:  [id number]  DATE OF BIRTH:  10-22-1940  SUBJECTIVE:   Patient is here due to rectal bleeding but hemoglobin is stable. Had one episode of rectal bleeding today. No abdominal pain, nausea, vomiting.  REVIEW OF SYSTEMS:    Review of Systems  Constitutional: Negative for fever and chills.  HENT: Negative for congestion and tinnitus.   Eyes: Negative for blurred vision and double vision.  Respiratory: Negative for cough, shortness of breath and wheezing.   Cardiovascular: Negative for chest pain, orthopnea and PND.  Gastrointestinal: Positive for blood in stool. Negative for nausea, vomiting, abdominal pain and diarrhea.  Genitourinary: Negative for dysuria and hematuria.  Neurological: Negative for dizziness, sensory change and focal weakness.  All other systems reviewed and are negative.   Nutrition: Clear liquid Tolerating Diet: Yes Tolerating PT:  Ambulatory   DRUG ALLERGIES:   Allergies  Allergen Reactions  . Penicillins Hives, Swelling and Other (See Comments)    Has patient had a PCN reaction causing immediate rash, facial/tongue/throat swelling, SOB or lightheadedness with hypotension: Yes Has patient had a PCN reaction causing severe rash involving mucus membranes or skin necrosis: No Has patient had a PCN reaction that required hospitalization No Has patient had a PCN reaction occurring within the last 10 years: Yes If all of the above answers are "NO", then may proceed with Cephalosporin use.     VITALS:  Blood pressure 131/56, pulse 61, temperature 98.3 F (36.8 C), temperature source Oral, resp. rate 19, height 5\' 3"  (1.6 m), weight 123.378 kg (272 lb), SpO2 99 %.  PHYSICAL EXAMINATION:   Physical Exam  GENERAL:  75 y.o.-year-old obese patient sitting up in chair in no acute distress.  EYES: Pupils equal, round, reactive to light and accommodation. No  scleral icterus. Extraocular muscles intact.  HEENT: Head atraumatic, normocephalic. Oropharynx and nasopharynx clear.  NECK:  Supple, no jugular venous distention. No thyroid enlargement, no tenderness.  LUNGS: Normal breath sounds bilaterally, no wheezing, rales, rhonchi. No use of accessory muscles of respiration.  CARDIOVASCULAR: S1, S2 normal. No murmurs, rubs, or gallops.  ABDOMEN: Soft, nontender, nondistended. Bowel sounds present. No organomegaly or mass.  EXTREMITIES: No cyanosis, clubbing or edema b/l.    NEUROLOGIC: Cranial nerves II through XII are intact. No focal Motor or sensory deficits b/l.   PSYCHIATRIC: The patient is alert and oriented x 3.  SKIN: No obvious rash, lesion, or ulcer.    LABORATORY PANEL:   CBC  Recent Labs Lab 08/02/15 2327  WBC 10.7  HGB 12.5  HCT 38.3  PLT 188   ------------------------------------------------------------------------------------------------------------------  Chemistries   Recent Labs Lab 08/02/15 2327  NA 139  K 4.6  CL 102  CO2 28  GLUCOSE 217*  BUN 34*  CREATININE 1.35*  CALCIUM 9.4  AST 34  ALT 32  ALKPHOS 37*  BILITOT 1.0   ------------------------------------------------------------------------------------------------------------------  Cardiac Enzymes No results for input(s): TROPONINI in the last 168 hours. ------------------------------------------------------------------------------------------------------------------  RADIOLOGY:  Ct Abdomen Pelvis W Contrast  08/03/2015  CLINICAL DATA:  Rectal bleeding and abdominal pain. Diarrhea. Weakness. Left lower quadrant pain. EXAM: CT ABDOMEN AND PELVIS WITH CONTRAST TECHNIQUE: Multidetector CT imaging of the abdomen and pelvis was performed using the standard protocol following bolus administration of intravenous contrast. CONTRAST:  126mL OMNIPAQUE IOHEXOL 300 MG/ML  SOLN COMPARISON:  None. FINDINGS: The lung bases are clear.  Small esophageal hiatal  hernia.  Diffuse fatty infiltration of the liver. Surgical absence of the gallbladder. No bile duct dilatation. The pancreas, spleen, adrenal glands, kidneys, inferior vena cava, and retroperitoneal lymph nodes are unremarkable. Calcification of the abdominal aorta without aneurysm. Stomach, small bowel, and colon are not abnormally distended. Stool fills the colon. No free air or free fluid in the abdomen. Abdominal wall musculature appears intact. Pelvis: Scattered diverticula in the sigmoid colon. No evidence of diverticulitis. Uterus and ovaries are not enlarged. No pelvic mass or lymphadenopathy. Bladder wall is not thickened. No free or loculated pelvic fluid collections. Appendix is surgically absent. Diffuse degenerative changes throughout the spine. Slight anterior subluxation of L4 on L5 is likely degenerative. IMPRESSION: No acute process demonstrated in the abdomen or pelvis. No evidence of bowel obstruction or inflammation. Diffuse fatty infiltration of the liver. Electronically Signed   By: Lucienne Capers M.D.   On: 08/03/2015 02:30     ASSESSMENT AND PLAN:   75 year old female with past medical history of diabetes, hypertension, hyperlipidemia, chronic renal failure, history of GI bleed who presents to the hospital due to rectal bleeding.  #1 GI bleed-this is a lower GI bleed given the patient's rectal bleeding. Patient CT scan of the abdomen and pelvis on admission was negative for any acute pathology. -Her hemoglobin is stable. She does have a history of diverticulosis status suspect this is likely diverticular. -We'll start her on clear liquid diet, appreciate GI input and therefore has no further bleeding will advance diet and possibly DC the next day or so. Follow up with GI as an outpatient for colonoscopy -Patient had a recent colonoscopy last year outside the hospital and GI attempting to obtain results. Hold ASA  #2 hypertension-continue clonidine, losartan, metoprolol,  verapamil..  #3 urinary tract infection-based off urinalysis. Will continue Levaquin.  #4 history of CHF-clinically patient is not in congestive heart failure. -Continue metoprolol, Aldactone  #5 hyperlipidemia-continue Crestor.  #6 diabetes type 2 without complication-continue Levemir, sliding scale insulin.  All the records are reviewed and case discussed with Care Management/Social Workerr. Management plans discussed with the patient, family and they are in agreement.  CODE STATUS: Full  DVT Prophylaxis: Teds and SCDs  TOTAL TIME TAKING CARE OF THIS PATIENT: 30 minutes.   POSSIBLE D/C IN 1-2 DAYS, DEPENDING ON CLINICAL CONDITION.   Henreitta Leber M.D on 08/03/2015 at 3:49 PM  Between 7am to 6pm - Pager - [phone]  After 6pm go to [email or web address] - password [email or web address] [facility]  Office  [phone]  CC: Primary care physician; Tracie Harrier, MD

## 2015-08-03 NOTE — ED Notes (Signed)
Pt states she has a pain in her upper abd that goes around into her shoulder blade - Pt describes the pain as a throbbing pain - Pt states abd pain for months - Pt states rectal bleeding started today after having diarrhea yesterday - The bleeding is bright red and moderate in amount - Pt states she has hx of hemorrhoids

## 2015-08-04 DIAGNOSIS — K921 Melena: Secondary | ICD-10-CM | POA: Diagnosis not present

## 2015-08-04 LAB — CBC
HEMATOCRIT: 35.7 % (ref 35.0–47.0)
HEMOGLOBIN: 11.7 g/dL — AB (ref 12.0–16.0)
MCH: 29.8 pg (ref 26.0–34.0)
MCHC: 32.7 g/dL (ref 32.0–36.0)
MCV: 90.9 fL (ref 80.0–100.0)
Platelets: 164 10*3/uL (ref 150–440)
RBC: 3.92 MIL/uL (ref 3.80–5.20)
RDW: 13.9 % (ref 11.5–14.5)
WBC: 6.9 10*3/uL (ref 3.6–11.0)

## 2015-08-04 LAB — GLUCOSE, CAPILLARY
Glucose-Capillary: 246 mg/dL — ABNORMAL HIGH (ref 65–99)
Glucose-Capillary: 250 mg/dL — ABNORMAL HIGH (ref 65–99)

## 2015-08-04 MED ORDER — HYDROCODONE-ACETAMINOPHEN 5-325 MG PO TABS: 1.0000 | ORAL_TABLET | Freq: Four times a day (QID) | ORAL | Status: DC | PRN

## 2015-08-04 MED ORDER — HYDROCORTISONE ACE-PRAMOXINE 1-1 % RE FOAM: 1.0000 | Freq: Two times a day (BID) | RECTAL | Status: DC

## 2015-08-04 NOTE — Progress Notes (Signed)
Patient alert and oriented, ambulates without assistance. IV discontinued, site without s/s of infection or infiltration. Patient discharged as ordered. Prescription given to patient as ordered.Follow up appointment given to patient. Dr [facility]  office will call patient for follow up in one week.  No acute distress noted.

## 2015-08-04 NOTE — Discharge Instructions (Signed)
°  DIET:  Cardiac diet and Diabetic diet  DISCHARGE CONDITION:  Stable  ACTIVITY:  Activity as tolerated  OXYGEN:  Home Oxygen: No.   Oxygen Delivery: room air  DISCHARGE LOCATION:  home   If you experience worsening of your admission symptoms, develop shortness of breath, life threatening emergency, suicidal or homicidal thoughts you must seek medical attention immediately by calling 911 or calling your MD immediately  if symptoms less severe.  You Must read complete instructions/literature along with all the possible adverse reactions/side effects for all the Medicines you take and that have been prescribed to you. Take any new Medicines after you have completely understood and accpet all the possible adverse reactions/side effects.   Please note  You were cared for by a hospitalist during your hospital stay. If you have any questions about your discharge medications or the care you received while you were in the hospital after you are discharged, you can call the unit and asked to speak with the hospitalist on call if the hospitalist that took care of you is not available. Once you are discharged, your primary care physician will handle any further medical issues. Please note that NO REFILLS for any discharge medications will be authorized once you are discharged, as it is imperative that you return to your primary care physician (or establish a relationship with a primary care physician if you do not have one) for your aftercare needs so that they can reassess your need for medications and monitor your lab values.   Please call your doctor or return to ED if any worsening of bleeding.

## 2015-08-05 LAB — URINE CULTURE
Culture: 70000
Special Requests: NORMAL

## 2015-08-07 NOTE — Discharge Summary (Signed)
[facility] [place] NAME: Alexis Farmer    MR#:  [id number]  DATE OF BIRTH:  01-10-1941  DATE OF ADMISSION:  08/03/2015 ADMITTING PHYSICIAN: Harrie Foreman, MD  DATE OF DISCHARGE: 08/04/2015  6:30 PM  PRIMARY CARE PHYSICIAN: Tracie Harrier, MD    ADMISSION DIAGNOSIS:  Lower GI bleed [K92.2]  DISCHARGE DIAGNOSIS:  Active Problems:   GI bleed   SECONDARY DIAGNOSIS:   Past Medical History  Diagnosis Date  . Diabetes mellitus without complication ([place])   . Hypertension   . High cholesterol   . Renal insufficiency   . GI bleed      ADMITTING HISTORY  Chief Complaint: Blood in stool HPI: The patient presents to the emergency department complaining of bloody bowel movements. She states that she had at least 2 large bright red bowel movements today that were painless. She denies abdominal pain, nausea or vomiting. She denies fevers, shortness of breath or chest pain. She admits that she has had episodes of diverticulitis in the past that resulted in bleeding that required blood transfusion. In the emergency department she she was found to heme positive stools. Due to her history of GI bleeding emergency department staff called the hospitalist service for admission.  HOSPITAL COURSE:   75 year old female with past medical history of diabetes, hypertension, hyperlipidemia, chronic renal failure, history of GI bleed who presents to the hospital due to rectal bleeding.  #1 GI bleed-this is a lower GI bleed given the patient's rectal bleeding. Patient CT scan of the abdomen and pelvis on admission was negative for any acute pathology. -Her hemoglobin is stable. She does have a history of diverticulosis status suspect this is likely diverticular. - Started on diet and tolerated well. On the day of discharge patient had a bowel movement in the morning without any blood or melena. - Hemoglobin stable and patient was discharged  home to follow-up with GI. - May need outpatient colonoscopy. - Advised patient to call her doctor or return to emergency room if there is any worsening bleeding.  #2 hypertension-continue clonidine, losartan, metoprolol, verapamil..  #3 urinary tract infection-based off urinalysis. Will continue Levaquin.  #4 history of CHF-clinically patient is not in congestive heart failure. -Continue metoprolol, Aldactone  #5 hyperlipidemia-continue Crestor.  #6 diabetes type 2 without complication-continue Levemir, sliding scale insulin.  Able for discharge home.   CONSULTS OBTAINED:     DRUG ALLERGIES:   Allergies  Allergen Reactions  . Penicillins Hives, Swelling and Other (See Comments)    Has patient had a PCN reaction causing immediate rash, facial/tongue/throat swelling, SOB or lightheadedness with hypotension: Yes Has patient had a PCN reaction causing severe rash involving mucus membranes or skin necrosis: No Has patient had a PCN reaction that required hospitalization No Has patient had a PCN reaction occurring within the last 10 years: Yes If all of the above answers are "NO", then may proceed with Cephalosporin use.     DISCHARGE MEDICATIONS:   Discharge Medication List as of 08/04/2015 12:23 PM    START taking these medications   Details  hydrocortisone-pramoxine (PROCTOFOAM-HC) rectal foam Place 1 applicator rectally 2 (two) times daily., Starting 08/04/2015, Until Discontinued, Print      CONTINUE these medications which have NOT CHANGED   Details  ALPRAZolam (XANAX) 0.5 MG tablet Take 0.5 mg by mouth at bedtime as needed for sleep. , Starting 08/14/2014, Until Discontinued, Historical Med    cloNIDine (CATAPRES) 0.2 MG tablet Take 1  tablet (0.2 mg total) by mouth 2 (two) times daily., Starting 08/26/2014, Until Discontinued, Normal    insulin regular (NOVOLIN R) 100 units/mL injection Inject 0.15 mLs (15 Units total) into the skin 2 (two) times daily before a meal.,  Starting 04/08/2014, Until Discontinued, No Print    meclizine (ANTIVERT) 12.5 MG tablet Take 12.5 mg by mouth 3 (three) times daily as needed for dizziness. Reported on 08/03/2015, Until Discontinued, Historical Med    metoprolol (LOPRESSOR) 50 MG tablet Take 1 tablet (50 mg total) by mouth 2 (two) times daily., Starting 08/26/2014, Until Discontinued, Normal    Multiple Vitamin (MULTIVITAMIN WITH MINERALS) TABS tablet Take 1 tablet by mouth daily., Until Discontinued, Historical Med    NOVOLIN N 100 UNIT/ML injection Inject 20-25 Units into the skin 2 (two) times daily before a meal. Takes 25 units am and 20 units pm daily., Starting 09/14/2013, Until Discontinued, Historical Med    polyethylene glycol powder (GLYCOLAX/MIRALAX) powder Take 17 g by mouth daily. , Starting 08/06/2013, Until Discontinued, Historical Med    rosuvastatin (CRESTOR) 10 MG tablet Take 1 tablet (10 mg total) by mouth daily., Starting 08/26/2014, Until Discontinued, Normal    spironolactone (ALDACTONE) 25 MG tablet Take 25 mg by mouth daily., Until Discontinued, Historical Med    torsemide (DEMADEX) 20 MG tablet Take 1 tablet (20 mg total) by mouth daily., Starting 08/26/2014, Until Discontinued, Normal    valsartan (DIOVAN) 320 MG tablet Take 1 tablet (320 mg total) by mouth daily., Starting 03/07/2015, Until Discontinued, Normal    verapamil (VERELAN PM) 240 MG 24 hr capsule Take 240 mg by mouth at bedtime., Until Discontinued, Historical Med         Today    VITAL SIGNS:  Blood pressure 145/57, pulse 83, temperature 98 F (36.7 C), temperature source Oral, resp. rate 20, height 5\' 3"  (1.6 m), weight 101.742 kg (224 lb 4.8 oz), SpO2 100 %.  I/O:  No intake or output data in the 24 hours ending 08/07/15 2155  PHYSICAL EXAMINATION:  Physical Exam  GENERAL:  75 y.o.-year-old patient lying in the bed with no acute distress. Obese LUNGS: Normal breath sounds bilaterally, no wheezing, rales,rhonchi or  crepitation. No use of accessory muscles of respiration.  CARDIOVASCULAR: S1, S2 normal. No murmurs, rubs, or gallops.  ABDOMEN: Soft, non-tender, non-distended. Bowel sounds present. No organomegaly or mass.  NEUROLOGIC: Moves all 4 extremities. PSYCHIATRIC: The patient is alert and oriented x 3.  SKIN: No obvious rash, lesion, or ulcer.   DATA REVIEW:   CBC  Recent Labs Lab 08/04/15 0408  WBC 6.9  HGB 11.7*  HCT 35.7  PLT 164    Chemistries   Recent Labs Lab 08/02/15 2327  NA 139  K 4.6  CL 102  CO2 28  GLUCOSE 217*  BUN 34*  CREATININE 1.35*  CALCIUM 9.4  AST 34  ALT 32  ALKPHOS 37*  BILITOT 1.0    Cardiac Enzymes No results for input(s): TROPONINI in the last 168 hours.  Microbiology Results  Results for orders placed or performed during the hospital encounter of 08/03/15  Urine culture     Status: None   Collection Time: 08/03/15 11:59 AM  Result Value Ref Range Status   Specimen Description URINE, RANDOM  Final   Special Requests Normal  Final   Culture   Final    70,000 COLONIES/ml GROUP B STREP(S.AGALACTIAE)ISOLATED Virtually 100% of S. agalactiae (Group B) strains are susceptible to Penicillin.  For Penicillin-allergic patients, Erythromycin (  85-95% sensitive) and Clindamycin (80% sensitive) are drugs of choice. Contact microbiology lab to request sensitivities if  needed within 7 days. MIXED BACTERIAL ORGANISMS    Report Status 08/05/2015 FINAL  Final    RADIOLOGY:  No results found.    Follow up with PCP in 1 week.  Management plans discussed with the patient, family and they are in agreement.  CODE STATUS:  Code Status History    Date Active Date Inactive Code Status Order ID Comments User Context   08/03/2015  6:56 AM 08/04/2015  9:30 PM Full Code [id number]  Harrie Foreman, MD Inpatient      TOTAL TIME TAKING CARE OF THIS PATIENT ON DAY OF DISCHARGE: more than 30 minutes.    Hillary Bow R M.D on 08/07/2015 at 9:55  PM  Between 7am to 6pm - Pager - [phone]  After 6pm go to [email or web address] - password [email or web address] [facility]  Office  [phone]  CC: Primary care physician; Tracie Harrier, MD  Note: This dictation was prepared with Dragon dictation along with smaller phrase technology. Any transcriptional errors that result from this process are unintentional.

## 2015-08-23 ENCOUNTER — Ambulatory Visit: Payer: Medicare Other | Admitting: Family Medicine

## 2015-09-13 ENCOUNTER — Ambulatory Visit (INDEPENDENT_AMBULATORY_CARE_PROVIDER_SITE_OTHER): Payer: Medicare Other | Admitting: Sports Medicine

## 2015-09-13 ENCOUNTER — Encounter: Payer: Self-pay | Admitting: Sports Medicine

## 2015-09-13 DIAGNOSIS — M79671 Pain in right foot: Secondary | ICD-10-CM | POA: Diagnosis not present

## 2015-09-13 DIAGNOSIS — B351 Tinea unguium: Secondary | ICD-10-CM

## 2015-09-13 DIAGNOSIS — E1165 Type 2 diabetes mellitus with hyperglycemia: Secondary | ICD-10-CM

## 2015-09-13 DIAGNOSIS — F419 Anxiety disorder, unspecified: Secondary | ICD-10-CM | POA: Insufficient documentation

## 2015-09-13 DIAGNOSIS — Z794 Long term (current) use of insulin: Secondary | ICD-10-CM

## 2015-09-13 DIAGNOSIS — I10 Essential (primary) hypertension: Secondary | ICD-10-CM | POA: Insufficient documentation

## 2015-09-13 DIAGNOSIS — M79676 Pain in unspecified toe(s): Secondary | ICD-10-CM

## 2015-09-13 DIAGNOSIS — N189 Chronic kidney disease, unspecified: Secondary | ICD-10-CM | POA: Insufficient documentation

## 2015-09-13 DIAGNOSIS — M79672 Pain in left foot: Secondary | ICD-10-CM

## 2015-09-13 DIAGNOSIS — Q6650 Congenital pes planus, unspecified foot: Secondary | ICD-10-CM

## 2015-09-13 DIAGNOSIS — N184 Chronic kidney disease, stage 4 (severe): Secondary | ICD-10-CM | POA: Insufficient documentation

## 2015-09-13 DIAGNOSIS — IMO0001 Reserved for inherently not codable concepts without codable children: Secondary | ICD-10-CM

## 2015-09-13 HISTORY — DX: Anxiety disorder, unspecified: F41.9

## 2015-09-13 NOTE — Progress Notes (Signed)
Patient ID: Alexis Farmer, female   DOB: July 08, 1940, 75 y.o.   MRN: [id number]  Subjective: Alexis Farmer is a 75 y.o. female patient with history of type  2 diabetes and CKD who presents to office today complaining of long, painful nails  while ambulating in shoes; unable to trim. Patient states that the glucose readings have been up and down high 160 and low of 80's. Denies new changes in medication or new problems. Patient denies any new cramping, numbness, burning or tingling in the legs.   Patient has wore her diabetic shoes once. Patient states that her sister in [place]. Is not doing well and is dying.  Patient Active Problem List   Diagnosis Date Noted  . Anxiety 09/13/2015  . Chronic kidney disease 09/13/2015  . BP (high blood pressure) 09/13/2015  . GI bleed 08/03/2015  . Type 2 diabetes mellitus ([place]) 03/11/2015  . Type 2 diabetes mellitus with hyperglycemia ([place]) 03/11/2015  . Hyperlipidemia 08/26/2014  . Essential hypertension, benign 04/08/2014  . Diabetes mellitus type II, uncontrolled ([place]) 04/08/2014  . CKD (chronic kidney disease) stage 3, GFR 30-59 ml/min 04/08/2014  . Pedal edema 04/08/2014  . Morbid obesity ([place]) 04/08/2014  . Lower back pain 04/08/2014  . Diabetic polyneuropathy ([place]) 04/08/2014   Current Outpatient Prescriptions on File Prior to Visit  Medication Sig Dispense Refill  . ALPRAZolam (XANAX) 0.5 MG tablet Take 0.5 mg by mouth at bedtime as needed for sleep.     . cloNIDine (CATAPRES) 0.2 MG tablet Take 1 tablet (0.2 mg total) by mouth 2 (two) times daily. 180 tablet 4  . HYDROcodone-acetaminophen (NORCO/VICODIN) 5-325 MG tablet Take 1 tablet by mouth every 6 (six) hours as needed for moderate pain. 20 tablet 0  . hydrocortisone-pramoxine (PROCTOFOAM-HC) rectal foam Place 1 applicator rectally 2 (two) times daily. 10 g 0  . insulin regular (NOVOLIN R) 100 units/mL injection Inject 0.15 mLs (15 Units total) into the skin 2 (two) times daily before a  meal. (Patient taking differently: Inject 10 Units into the skin 3 (three) times daily before meals. ) 10 mL 0  . meclizine (ANTIVERT) 12.5 MG tablet Take 12.5 mg by mouth 3 (three) times daily as needed for dizziness. Reported on 08/03/2015    . metoprolol (LOPRESSOR) 50 MG tablet Take 1 tablet (50 mg total) by mouth 2 (two) times daily. 180 tablet 4  . Multiple Vitamin (MULTIVITAMIN WITH MINERALS) TABS tablet Take 1 tablet by mouth daily.    Marland Kitchen NOVOLIN N 100 UNIT/ML injection Inject 20-25 Units into the skin 2 (two) times daily before a meal. Takes 25 units am and 20 units pm daily.    . polyethylene glycol powder (GLYCOLAX/MIRALAX) powder Take 17 g by mouth daily.     . rosuvastatin (CRESTOR) 10 MG tablet Take 1 tablet (10 mg total) by mouth daily. 90 tablet 4  . spironolactone (ALDACTONE) 25 MG tablet Take 25 mg by mouth daily.    Marland Kitchen torsemide (DEMADEX) 20 MG tablet Take 1 tablet (20 mg total) by mouth daily. 90 tablet 4  . valsartan (DIOVAN) 320 MG tablet Take 1 tablet (320 mg total) by mouth daily. 90 tablet 3  . verapamil (VERELAN PM) 240 MG 24 hr capsule Take 240 mg by mouth at bedtime.     No current facility-administered medications on file prior to visit.   Allergies  Allergen Reactions  . Penicillins Hives, Swelling and Other (See Comments)    Has patient had a PCN reaction causing immediate rash,  facial/tongue/throat swelling, SOB or lightheadedness with hypotension: Yes Has patient had a PCN reaction causing severe rash involving mucus membranes or skin necrosis: No Has patient had a PCN reaction that required hospitalization No Has patient had a PCN reaction occurring within the last 10 years: Yes If all of the above answers are "NO", then may proceed with Cephalosporin use.      Objective: General: Patient is awake, alert, and oriented x 3 and in no acute distress. Cane assisted gait  Integument: Skin is warm, dry and supple bilateral. Nails are tender, long, thickened and   dystrophic with subungual debris, consistent with onychomycosis, 1-5 bilateral. No signs of infection. No open lesions or preulcerative lesions present bilateral. Remaining integument unremarkable.  Vasculature:  Dorsalis Pedis pulse 2/4 bilateral. Posterior Tibial pulse  1/4 bilateral.  Capillary fill time <3 sec 1-5 bilateral. Scant hair growth to the level of the digits. Temperature gradient within normal limits. No varicosities present bilateral. No edema present bilateral.   Neurology: The patient has intact sensation measured with a 5.07/10g Semmes Weinstein Monofilament at all pedal sites bilateral . Vibratory sensation slightly diminished bilateral with tuning fork. No Babinski sign present bilateral.   Musculoskeletal: No gross pedal deformities noted bilateral. Muscular strength 5/5 in all lower extremity muscular groups bilateral without pain or limitation on range of motion . No tenderness with calf compression bilateral.  Assessment and Plan: Problem List Items Addressed This Visit      Endocrine   Diabetes mellitus type II, uncontrolled ([place]) - Primary    Other Visit Diagnoses    Dermatophytosis of nail        Congenital pes planus, unspecified laterality        Pain of toe, unspecified laterality        Bilateral foot pain           -Examined patient. -Discussed and educated patient on diabetic foot care, especially with  regards to the vascular, neurological and musculoskeletal systems.  -Stressed the importance of good glycemic control and the detriment of not  controlling glucose levels in relation to the foot. -Mechanically debrided all nails 1-5 bilateral using sterile nail nipper and filed with dremel without incident  -Cont with diabetic shoes with break in slowly -Answered all patient questions -Patient to return in 3 months for at risk foot care -Patient advised to call the office if any problems or questions arise in the meantime.  Alexis Farmer, DPM

## 2015-09-19 ENCOUNTER — Ambulatory Visit: Payer: Self-pay | Admitting: Gastroenterology

## 2015-09-21 ENCOUNTER — Ambulatory Visit: Payer: 59 | Admitting: Gastroenterology

## 2015-10-13 ENCOUNTER — Ambulatory Visit (INDEPENDENT_AMBULATORY_CARE_PROVIDER_SITE_OTHER): Payer: Medicare Other | Admitting: Family Medicine

## 2015-10-13 ENCOUNTER — Encounter: Payer: Self-pay | Admitting: Family Medicine

## 2015-10-13 VITALS — BP 130/64 | HR 69 | Temp 98.3°F | Ht 63.0 in | Wt 274.6 lb

## 2015-10-13 DIAGNOSIS — N183 Chronic kidney disease, stage 3 unspecified: Secondary | ICD-10-CM

## 2015-10-13 DIAGNOSIS — E1122 Type 2 diabetes mellitus with diabetic chronic kidney disease: Secondary | ICD-10-CM

## 2015-10-13 DIAGNOSIS — R600 Localized edema: Secondary | ICD-10-CM | POA: Diagnosis not present

## 2015-10-13 DIAGNOSIS — F329 Major depressive disorder, single episode, unspecified: Secondary | ICD-10-CM | POA: Diagnosis not present

## 2015-10-13 DIAGNOSIS — R42 Dizziness and giddiness: Secondary | ICD-10-CM | POA: Insufficient documentation

## 2015-10-13 DIAGNOSIS — Z794 Long term (current) use of insulin: Secondary | ICD-10-CM

## 2015-10-13 DIAGNOSIS — F339 Major depressive disorder, recurrent, unspecified: Secondary | ICD-10-CM | POA: Insufficient documentation

## 2015-10-13 DIAGNOSIS — F32A Depression, unspecified: Secondary | ICD-10-CM

## 2015-10-13 DIAGNOSIS — E1165 Type 2 diabetes mellitus with hyperglycemia: Secondary | ICD-10-CM

## 2015-10-13 DIAGNOSIS — M1990 Unspecified osteoarthritis, unspecified site: Secondary | ICD-10-CM | POA: Diagnosis not present

## 2015-10-13 DIAGNOSIS — IMO0002 Reserved for concepts with insufficient information to code with codable children: Secondary | ICD-10-CM

## 2015-10-13 HISTORY — DX: Dizziness and giddiness: R42

## 2015-10-13 LAB — COMPREHENSIVE METABOLIC PANEL
ALBUMIN: 4.1 g/dL (ref 3.5–5.2)
ALT: 22 U/L (ref 0–35)
AST: 23 U/L (ref 0–37)
Alkaline Phosphatase: 37 U/L — ABNORMAL LOW (ref 39–117)
BUN: 21 mg/dL (ref 6–23)
CHLORIDE: 96 meq/L (ref 96–112)
CO2: 35 meq/L — AB (ref 19–32)
CREATININE: 1.37 mg/dL — AB (ref 0.40–1.20)
Calcium: 10 mg/dL (ref 8.4–10.5)
GFR: 48.11 mL/min — ABNORMAL LOW (ref >60.00)
GLUCOSE: 237 mg/dL — AB (ref 70–99)
POTASSIUM: 4.3 meq/L (ref 3.5–5.1)
SODIUM: 136 meq/L (ref 135–145)
Total Bilirubin: 0.5 mg/dL (ref 0.2–1.2)
Total Protein: 7.3 g/dL (ref 6.0–8.3)

## 2015-10-13 LAB — CBC
HEMATOCRIT: 38.8 % (ref 36.0–46.0)
HEMOGLOBIN: 12.8 g/dL (ref 12.0–15.0)
MCHC: 32.9 g/dL (ref 30.0–36.0)
MCV: 89 fl (ref 78.0–100.0)
Platelets: 217 10*3/uL (ref 150.0–400.0)
RBC: 4.36 Mil/uL (ref 3.87–5.11)
RDW: 13.6 % (ref 11.5–15.5)
WBC: 7.6 10*3/uL (ref 4.0–10.5)

## 2015-10-13 LAB — HEMOGLOBIN A1C: HEMOGLOBIN A1C: 9.2 % — AB (ref 4.6–6.5)

## 2015-10-13 LAB — TSH: TSH: 1.7 u[IU]/mL (ref 0.35–4.50)

## 2015-10-13 NOTE — Assessment & Plan Note (Signed)
The pain the patient is having in her knees and hands is likely related to osteoarthritis. She's being followed by an orthopedic physician for her knees. I discussed pain management strategies regarding her pain though she would like to hold off on starting additional medications at this time. She will continue as needed Tylenol. She'll continue to monitor.

## 2015-10-13 NOTE — Assessment & Plan Note (Signed)
Home CBGs appear uncontrolled. We will check an A1c today. Check other lab work as outlined below. We'll base changes in regimen on A1c.

## 2015-10-13 NOTE — Assessment & Plan Note (Signed)
No recent issues with vertigo. She is followed by ENT for this. She'll continue to monitor for recurrence. Given return precautions.

## 2015-10-13 NOTE — Progress Notes (Signed)
Pre visit review using our clinic review tool, if applicable. No additional management support is needed unless otherwise documented below in the visit note. 

## 2015-10-13 NOTE — Assessment & Plan Note (Signed)
Chronic issue. Currently untreated. No SI or HI. Discussed potential medication versus therapy for this though she declined these. She would like to continue to monitor. I discussed getting out of the house more and being more active to see if this would help. In return precautions.

## 2015-10-13 NOTE — Progress Notes (Signed)
Patient ID: Alexis Farmer, female   DOB: [birth year], 75 y.o.   MRN: [id number]  Tommi Rumps, MD Phone: [phone]  Alexis Farmer is a 75 y.o. female who presents today for patient visit.  DIABETES Disease Monitoring: Blood Sugar ranges-180-190s Polyuria/phagia/dipsia- some polyuria      Visual problems- no Medications: Compliance- taking regular insulin 15 units morning and at night. Taking Novolin 25 units in the a.m. and 20 units in the p.m. Hypoglycemic symptoms- infrequent mostly at night. States she wakes up and her hands will be numb bilaterally she'll check her sugars and they will typically be in the 60s or 70s. She'll drink some orange juice and the symptoms will resolve.  Vertigo: Patient notes no recent issues with vertigo. Has had this in the past. Describes as room spinning. Can occasionally occur when she is just sitting there or when she changes positions. She is followed by ENT for this.  Arthritis: Patient notes joint pain particularly in her hands and knees. She is seeing an orthopedic specialist who has been giving her injections in her knees and her knees have improved. She takes Tylenol for her hands. In the past she has intermittently taken Vicodin for this. At this point all she is taking is Tylenol and she is trying to bear with it.  Depression: She's had this long time. Notes it has worsened since moving down to [place]. Notes she just has no interest in doing anything. No SI or HI. No hobbies. She is not currently on any medications.  Bilateral pedal swelling: Notes this has been going on for a long time. She's been followed by renal for this. They placed her on blood pressure medications. She notes the swelling always goes down with propping her feet up and at night. No chest pain, shortness of breath, or orthopnea.  Active Ambulatory Problems    Diagnosis Date Noted  . Essential hypertension, benign 04/08/2014  . Diabetes mellitus type II,  uncontrolled ([place]) 04/08/2014  . CKD (chronic kidney disease) stage 3, GFR 30-59 ml/min 04/08/2014  . Pedal edema 04/08/2014  . Morbid obesity ([place]) 04/08/2014  . Lower back pain 04/08/2014  . Diabetic polyneuropathy ([place]) 04/08/2014  . Hyperlipidemia 08/26/2014  . GI bleed 08/03/2015  . Anxiety 09/13/2015  . Chronic kidney disease 09/13/2015  . BP (high blood pressure) 09/13/2015  . Type 2 diabetes mellitus ([place]) 03/11/2015  . Depression 10/13/2015  . Osteoarthritis 10/13/2015  . Vertigo 10/13/2015   Resolved Ambulatory Problems    Diagnosis Date Noted  . Type 2 diabetes mellitus with hyperglycemia ([place]) 03/11/2015   Past Medical History  Diagnosis Date  . Diabetes mellitus without complication ([place])   . Hypertension   . High cholesterol   . Renal insufficiency   . Diverticulitis   . Chickenpox   . Arthritis   . History of blood transfusion     Family History  Problem Relation Age of Onset  . Diabetes Mother   . Hypertension Mother   . Stroke Mother   . Diabetes Sister   . Heart disease Sister   . Diabetes Other     Social History   Social History  . Marital Status: Widowed    Spouse Name: N/A  . Number of Children: N/A  . Years of Education: N/A   Occupational History  . Not on file.   Social History Main Topics  . Smoking status: Never Smoker   . Smokeless tobacco: Never Used  . Alcohol Use: No  . Drug  Use: No  . Sexual Activity: Not on file   Other Topics Concern  . Not on file   Social History Narrative    ROS  General:  Negative for nexplained weight loss, fever Skin: Negative for new or changing mole, sore that won't heal HEENT: Negative for trouble hearing, trouble seeing, ringing in ears, mouth sores, hoarseness, change in voice, dysphagia. CV:  Positive for edema, Negative for chest pain, dyspnea, palpitations Resp: Negative for cough, dyspnea, hemoptysis GI: Positive for constipation, Negative for nausea, vomiting, diarrhea,  abdominal pain, melena, hematochezia. GU: Negative for dysuria, incontinence, urinary hesitance, hematuria, vaginal or penile discharge, polyuria, sexual difficulty, lumps in testicle or breasts MSK: Negative for muscle cramps or aches, positive for joint pain or swelling Neuro: Positive for numbness, dizziness, Negative for headaches, weakness, passing out/fainting Psych: Positive for depression, anxiety, negative for memory problems  Objective  Physical Exam Filed Vitals:   10/13/15 1039  BP: 130/64  Pulse: 69  Temp: 98.3 F (36.8 C)    BP Readings from Last 3 Encounters:  10/13/15 130/64  08/04/15 145/57  06/06/15 141/79   Wt Readings from Last 3 Encounters:  10/13/15 274 lb 9.6 oz (124.558 kg)  08/04/15 224 lb 4.8 oz (101.742 kg)  06/06/15 266 lb 12 oz (120.997 kg)    Physical Exam  Constitutional: She is well-developed, well-nourished, and in no distress.  HENT:  Head: Normocephalic and atraumatic.  Right Ear: External ear normal.  Left Ear: External ear normal.  Mouth/Throat: Oropharynx is clear and moist. No oropharyngeal exudate.  Eyes: Conjunctivae are normal. Pupils are equal, round, and reactive to light.  Neck: Neck supple.  Cardiovascular: Normal rate, regular rhythm and normal heart sounds.   Pulmonary/Chest: Effort normal and breath sounds normal.  Abdominal: Soft. Bowel sounds are normal. She exhibits no distension. There is no tenderness. There is no rebound and no guarding.  Musculoskeletal: She exhibits edema (trace lower extremity).  Bilateral knees with no joint swelling, joint line tenderness, warmth, or erythema, no ligamentous laxity, negative McMurray's Bilateral hands with no joint swelling or joint tenderness, full range of motion of fingers, no warmth or erythema, hands are warm and well-perfused  Lymphadenopathy:    She has no cervical adenopathy.  Neurological: She is alert.  CN 2-12 intact, 5/5 strength in bilateral biceps, triceps, grip,  quads, hamstrings, plantar and dorsiflexion, sensation to light touch intact in bilateral UE and LE, normal gait, 2+ patellar reflexes  Skin: Skin is warm and dry. She is not diaphoretic.  Psychiatric:  Mood depressed, affect mildly depressed     Assessment/Plan:   Diabetes mellitus type II, uncontrolled Home CBGs appear uncontrolled. We will check an A1c today. Check other lab work as outlined below. We'll base changes in regimen on A1c.  Pedal edema Patient with persistent intermittent swelling in her bilateral feet. Could be venous insufficiency. No heart failure symptoms. Could be related to her CKD. We'll check lab work as outlined below. She'll continue to prop legs up. Could consider compression stockings in the future.  Depression Chronic issue. Currently untreated. No SI or HI. Discussed potential medication versus therapy for this though she declined these. She would like to continue to monitor. I discussed getting out of the house more and being more active to see if this would help. In return precautions.  Osteoarthritis The pain the patient is having in her knees and hands is likely related to osteoarthritis. She's being followed by an orthopedic physician for her knees. I  discussed pain management strategies regarding her pain though she would like to hold off on starting additional medications at this time. She will continue as needed Tylenol. She'll continue to monitor.  Vertigo No recent issues with vertigo. She is followed by ENT for this. She'll continue to monitor for recurrence. Given return precautions.    Orders Placed This Encounter  Procedures  . TSH  . HgB A1c  . Comp Met (CMET)  . CBC    Tommi Rumps, MD [place]

## 2015-10-13 NOTE — Patient Instructions (Addendum)
Nice to meet you. Please continue to follow up with the orthopedics for your knees. Please monitor your hands. She can use Tylenol for discomfort. We will check some lab work to evaluate her diabetes and the swelling in her feet. If you have recurrent severe vertigo please let ENT know. If you develop worsening depression please let us know. If she develop thoughts of harming herself or others please seek medical attention. If you develop chest pain, shortness of breath, dizziness, numbness, weakness, or any new or changing symptoms please seek medical attention.

## 2015-10-13 NOTE — Assessment & Plan Note (Signed)
Patient with persistent intermittent swelling in her bilateral feet. Could be venous insufficiency. No heart failure symptoms. Could be related to her CKD. We'll check lab work as outlined below. She'll continue to prop legs up. Could consider compression stockings in the future.

## 2015-10-19 ENCOUNTER — Other Ambulatory Visit: Payer: Self-pay | Admitting: Cardiovascular Disease

## 2015-11-15 ENCOUNTER — Telehealth: Payer: Self-pay | Admitting: *Deleted

## 2015-11-15 ENCOUNTER — Other Ambulatory Visit: Payer: Self-pay | Admitting: Family Medicine

## 2015-11-15 ENCOUNTER — Encounter: Payer: Self-pay | Admitting: Family Medicine

## 2015-11-15 ENCOUNTER — Ambulatory Visit (INDEPENDENT_AMBULATORY_CARE_PROVIDER_SITE_OTHER): Payer: Medicare Other | Admitting: Family Medicine

## 2015-11-15 VITALS — BP 138/76 | HR 80 | Temp 98.2°F | Ht 63.0 in | Wt 274.6 lb

## 2015-11-15 DIAGNOSIS — I10 Essential (primary) hypertension: Secondary | ICD-10-CM | POA: Diagnosis not present

## 2015-11-15 DIAGNOSIS — F32A Depression, unspecified: Secondary | ICD-10-CM

## 2015-11-15 DIAGNOSIS — R5382 Chronic fatigue, unspecified: Secondary | ICD-10-CM | POA: Insufficient documentation

## 2015-11-15 DIAGNOSIS — N183 Chronic kidney disease, stage 3 unspecified: Secondary | ICD-10-CM

## 2015-11-15 DIAGNOSIS — R42 Dizziness and giddiness: Secondary | ICD-10-CM

## 2015-11-15 DIAGNOSIS — IMO0002 Reserved for concepts with insufficient information to code with codable children: Secondary | ICD-10-CM

## 2015-11-15 DIAGNOSIS — Z794 Long term (current) use of insulin: Secondary | ICD-10-CM

## 2015-11-15 DIAGNOSIS — E1122 Type 2 diabetes mellitus with diabetic chronic kidney disease: Secondary | ICD-10-CM | POA: Diagnosis not present

## 2015-11-15 DIAGNOSIS — E1165 Type 2 diabetes mellitus with hyperglycemia: Secondary | ICD-10-CM

## 2015-11-15 DIAGNOSIS — F329 Major depressive disorder, single episode, unspecified: Secondary | ICD-10-CM

## 2015-11-15 DIAGNOSIS — M1990 Unspecified osteoarthritis, unspecified site: Secondary | ICD-10-CM

## 2015-11-15 LAB — BASIC METABOLIC PANEL
BUN: 19 mg/dL (ref 6–23)
CHLORIDE: 101 meq/L (ref 96–112)
CO2: 31 meq/L (ref 19–32)
CREATININE: 1.23 mg/dL — AB (ref 0.40–1.20)
Calcium: 9.7 mg/dL (ref 8.4–10.5)
GFR: 54.47 mL/min — ABNORMAL LOW (ref >60.00)
GLUCOSE: 236 mg/dL — AB (ref 70–99)
Potassium: 4.8 mEq/L (ref 3.5–5.1)
Sodium: 136 mEq/L (ref 135–145)

## 2015-11-15 MED ORDER — VERAPAMIL HCL ER 240 MG PO CP24: 240.0000 mg | ORAL_CAPSULE | Freq: Every day | ORAL | Status: DC

## 2015-11-15 MED ORDER — CLONIDINE HCL 0.2 MG PO TABS: 0.2000 mg | ORAL_TABLET | Freq: Two times a day (BID) | ORAL | Status: DC

## 2015-11-15 MED ORDER — TORSEMIDE 20 MG PO TABS: 20.0000 mg | ORAL_TABLET | Freq: Every day | ORAL | Status: DC

## 2015-11-15 MED ORDER — SERTRALINE HCL 50 MG PO TABS: 25.0000 mg | ORAL_TABLET | Freq: Every day | ORAL | Status: DC

## 2015-11-15 MED ORDER — ALPRAZOLAM 0.5 MG PO TABS: 0.5000 mg | ORAL_TABLET | Freq: Every evening | ORAL | Status: DC | PRN

## 2015-11-15 MED ORDER — INSULIN REGULAR HUMAN 100 UNIT/ML IJ SOLN: 10.0000 [IU] | Freq: Three times a day (TID) | INTRAMUSCULAR | Status: DC

## 2015-11-15 MED ORDER — METOPROLOL TARTRATE 50 MG PO TABS: 50.0000 mg | ORAL_TABLET | Freq: Two times a day (BID) | ORAL | Status: DC

## 2015-11-15 MED ORDER — ROSUVASTATIN CALCIUM 10 MG PO TABS: ORAL_TABLET | ORAL | Status: DC

## 2015-11-15 MED ORDER — SPIRONOLACTONE 25 MG PO TABS: 25.0000 mg | ORAL_TABLET | Freq: Every day | ORAL | Status: DC

## 2015-11-15 MED ORDER — INSULIN NPH (HUMAN) (ISOPHANE) 100 UNIT/ML ~~LOC~~ SUSP: 15.0000 [IU] | Freq: Two times a day (BID) | SUBCUTANEOUS | Status: DC

## 2015-11-15 MED ORDER — MECLIZINE HCL 12.5 MG PO TABS: 12.5000 mg | ORAL_TABLET | Freq: Three times a day (TID) | ORAL | Status: DC | PRN

## 2015-11-15 MED ORDER — POLYETHYLENE GLYCOL 3350 17 GM/SCOOP PO POWD: 17.0000 g | Freq: Every day | ORAL | Status: DC

## 2015-11-15 MED ORDER — VALSARTAN 320 MG PO TABS: 320.0000 mg | ORAL_TABLET | Freq: Every day | ORAL | Status: DC

## 2015-11-15 NOTE — Progress Notes (Signed)
Pre visit review using our clinic review tool, if applicable. No additional management support is needed unless otherwise documented below in the visit note. 

## 2015-11-15 NOTE — Assessment & Plan Note (Signed)
Patient some chronic exertional tiredness and fatigue. She doesn't have any chest pain or shortness of breath at rest or with exertion. EKG today appears similar to prior. Discussed potential causes. Normal TSH and CBC at last visit. Could be related to depression. Could be related to cardiac cause. Could be related to obesity. We will send a message to the patient's cardiologist to ensure proper follow-up. Patient will continue to monitor and if worsen she will let us know. She is given return precautions.

## 2015-11-15 NOTE — Patient Instructions (Addendum)
Nice to see you. We will start her on Zoloft for depression. I refilled your blood pressure medications. We will obtain lab work today. Your new NPH dose is 30 units in the morning and 15 units at night. Please continue regular insulin 10 units 3 times daily. Please monitor your blood sugars prior to eating in the morning and once later in the day at night. Also check your blood sugar if it feels low. Please call us in 1 week with her blood sugar readings. If you develop thoughts of harming herself or others, chest pain, shortness of breath, low blood sugars, increasing blood sugars, or any new or changing symptoms please seek medical attention.

## 2015-11-15 NOTE — Assessment & Plan Note (Signed)
Slightly worsened from previously. Some thoughts of being better off not alive though no plan or intent to harm herself. No HI. Patient is willing to start on medication to help treat this. We'll start on Zoloft 25 mg daily. She will continue to monitor. Xanax refilled for sleep and anxiety. She is given return precautions.

## 2015-11-15 NOTE — Telephone Encounter (Signed)
Patient stated that she read the side effects for Sertraline, this medication has possible side effects of bleeding, she questioned if this medication would be safe for her,due to her ulcer intestinal bleed at times. [phone]

## 2015-11-15 NOTE — Progress Notes (Signed)
Patient ID: Alexis Farmer, female   DOB: [birth year], 75 y.o.   MRN: [id number]  Tommi Rumps, MD Phone: [phone]  Alexis Farmer is a 75 y.o. female who presents today for follow-up.  HYPERTENSION Disease Monitoring: Blood pressure range-not checking Chest pain, palpitations- no      Dyspnea- no Medications: Compliance- taking clonidine, metoprolol, spironolactone, torsemide, valsartan, and verapamil         edema- minimal, unchanged from previous Patient does report some exertional fatigue that has been stable and unchanged for many years. Patient thinks this may be related to her weight. No orthopnea or PND.  DIABETES Disease Monitoring: Blood Sugar ranges-70-200 Polyuria/phagia/dipsia- no      Medications: Compliance- taking regular insulin 10 units 3 times a day with meals, NPH 25 units a.m. and 20 units p.m. Hypoglycemic symptoms- yes, typically occurs at night, will check her sugar and will be around 70, she will eat something and this will improve, notes her symptoms are described as her hands becoming numb and tingly bilaterally if her sugars are less than 130. Notes no hand numbness otherwise.  Vertigo: Has not had vertigo in a number of months. Previously saw ENT for this. Describes a sensation of the room spinning. She's been taking Antivert once a day.  Depression: Patient notes this is gotten worse since I last saw her. She thinks about her aches and pains and feels as though she does not deal with them anymore. Has thoughts of being better off not being alive though no plan or intent to harm herself. No HI. Notes she is tired of fighting. Please see below for symptomatology.  Depression screen [facility] 2/9 11/15/2015 10/13/2015  Decreased Interest 3 3  Down, Depressed, Hopeless 2 1  PHQ - 2 Score 5 4  Altered sleeping 1 2  Tired, decreased energy 3 2  Change in appetite - 0  Feeling bad or failure about yourself  0 0  Trouble concentrating 0 0  Moving slowly or  fidgety/restless 0 0  Suicidal thoughts 0 0  PHQ-9 Score 9 8  Difficult doing work/chores Somewhat difficult Not difficult at all   GAD 7 : Generalized Anxiety Score 11/15/2015  Nervous, Anxious, on Edge 0  Control/stop worrying 1  Worry too much - different things 1  Trouble relaxing 1  Restless 0  Easily annoyed or irritable 1  Afraid - awful might happen 0  Total GAD 7 Score 4  Anxiety Difficulty Somewhat difficult   Patient notes scattered aches and pains. Particularly in her fingers. No joint swelling. She is doing physical therapy at this time and notes this has not helped very much. Tylenol has not helped very much. Prior pain medication has not helped very much. She is followed by orthopedics for this.  PMH: nonsmoker.   ROS see history of present illness  Objective  Physical Exam Filed Vitals:   11/15/15 0914  BP: 138/76  Pulse: 80  Temp: 98.2 F (36.8 C)    BP Readings from Last 3 Encounters:  11/15/15 138/76  10/13/15 130/64  08/04/15 145/57   Wt Readings from Last 3 Encounters:  11/15/15 274 lb 9.6 oz (124.558 kg)  10/13/15 274 lb 9.6 oz (124.558 kg)  08/04/15 224 lb 4.8 oz (101.742 kg)    Physical Exam  Constitutional: She is well-developed, well-nourished, and in no distress.  HENT:  Head: Normocephalic and atraumatic.  Right Ear: External ear normal.  Left Ear: External ear normal.  Cardiovascular: Normal rate, regular rhythm and normal  heart sounds.   Trace lower extremity edema  Pulmonary/Chest: Effort normal and breath sounds normal.  Neurological: She is alert. Gait normal.  Skin: Skin is warm and dry. She is not diaphoretic.  Psychiatric:  Mood depressed, affect flat depressed   EKG: Normal sinus rhythm, first-degree AV block, nonspecific T-wave abnormalities  Assessment/Plan: Please see individual problem list.  Diabetes mellitus type II, uncontrolled Not at goal per her last A1c. Discussed several options for treatment. Discussed  changing to a long-acting insulin such as Lantus or Levemir. Discussed having endocrinology evaluate her. Discussed altering her current medication regimen. Patient did not want to start on a new insulin. She did not want to see endocrinology. She was hesitant to increase her dose of insulin. Given her lows at nights we will decrease her evening dose and increase her morning dose of NPH as outlined below. We will refill her regular insulin as well. She will monitor her sugars fasting and once in the evening and call in 1-2 weeks with these readings to let us know how her sugars are doing.  Essential hypertension, benign At goal. No changes to current regimen. Refills given as outlined below. Check BMP today.  Vertigo No recurrence. Discussed that meclizine is an as needed medication and that she does not need to take this daily. She will continue to monitor.  Depression Slightly worsened from previously. Some thoughts of being better off not alive though no plan or intent to harm herself. No HI. Patient is willing to start on medication to help treat this. We'll start on Zoloft 25 mg daily. She will continue to monitor. Xanax refilled for sleep and anxiety. She is given return precautions.  Osteoarthritis Continue to follow with orthopedics for this.  Chronic fatigue Patient some chronic exertional tiredness and fatigue. She doesn't have any chest pain or shortness of breath at rest or with exertion. EKG today appears similar to prior. Discussed potential causes. Normal TSH and CBC at last visit. Could be related to depression. Could be related to cardiac cause. Could be related to obesity. We will send a message to the patient's cardiologist to ensure proper follow-up. Patient will continue to monitor and if worsen she will let us know. She is given return precautions.    Orders Placed This Encounter  Procedures  . Basic Metabolic Panel (BMET)  . EKG 12-Lead    Meds ordered this encounter    Medications  . insulin NPH Human (NOVOLIN N) 100 UNIT/ML injection    Sig: Inject 0.15-0.3 mLs (15-30 Units total) into the skin 2 (two) times daily before a meal. Takes 30 units am and 15units pm daily.    Dispense:  10 mL    Refill:  3  . ALPRAZolam (XANAX) 0.5 MG tablet    Sig: Take 1 tablet (0.5 mg total) by mouth at bedtime as needed for sleep. Reported on 10/13/2015    Dispense:  90 tablet    Refill:  0  . cloNIDine (CATAPRES) 0.2 MG tablet    Sig: Take 1 tablet (0.2 mg total) by mouth 2 (two) times daily.    Dispense:  180 tablet    Refill:  3  . meclizine (ANTIVERT) 12.5 MG tablet    Sig: Take 1 tablet (12.5 mg total) by mouth 3 (three) times daily as needed for dizziness. Reported on 08/03/2015    Dispense:  30 tablet    Refill:  0  . metoprolol (LOPRESSOR) 50 MG tablet    Sig: Take 1  tablet (50 mg total) by mouth 2 (two) times daily.    Dispense:  180 tablet    Refill:  3  . polyethylene glycol powder (GLYCOLAX/MIRALAX) powder    Sig: Take 17 g by mouth daily.    Dispense:  765 g    Refill:  0  . rosuvastatin (CRESTOR) 10 MG tablet    Sig: TAKE 1 TABLET (10 MG TOTAL) BY MOUTH DAILY.    Dispense:  90 tablet    Refill:  3  . spironolactone (ALDACTONE) 25 MG tablet    Sig: Take 1 tablet (25 mg total) by mouth daily.    Dispense:  90 tablet    Refill:  3  . torsemide (DEMADEX) 20 MG tablet    Sig: Take 1 tablet (20 mg total) by mouth daily.    Dispense:  90 tablet    Refill:  3  . valsartan (DIOVAN) 320 MG tablet    Sig: Take 1 tablet (320 mg total) by mouth daily.    Dispense:  90 tablet    Refill:  3  . verapamil (VERELAN PM) 240 MG 24 hr capsule    Sig: Take 1 capsule (240 mg total) by mouth at bedtime.    Dispense:  90 capsule    Refill:  3  . insulin regular (NOVOLIN R) 100 units/mL injection    Sig: Inject 0.1 mLs (10 Units total) into the skin 3 (three) times daily before meals.    Dispense:  10 mL    Refill:  3  . sertraline (ZOLOFT) 50 MG tablet     Sig: Take 0.5 tablets (25 mg total) by mouth daily.    Dispense:  30 tablet    Refill:  [place], MD [place]

## 2015-11-15 NOTE — Telephone Encounter (Signed)
Please advise 

## 2015-11-15 NOTE — Assessment & Plan Note (Signed)
At goal. No changes to current regimen. Refills given as outlined below. Check BMP today.

## 2015-11-15 NOTE — Assessment & Plan Note (Signed)
Not at goal per her last A1c. Discussed several options for treatment. Discussed changing to a long-acting insulin such as Lantus or Levemir. Discussed having endocrinology evaluate her. Discussed altering her current medication regimen. Patient did not want to start on a new insulin. She did not want to see endocrinology. She was hesitant to increase her dose of insulin. Given her lows at nights we will decrease her evening dose and increase her morning dose of NPH as outlined below. We will refill her regular insulin as well. She will monitor her sugars fasting and once in the evening and call in 1-2 weeks with these readings to let us know how her sugars are doing.

## 2015-11-15 NOTE — Assessment & Plan Note (Signed)
No recurrence. Discussed that meclizine is an as needed medication and that she does not need to take this daily. She will continue to monitor.

## 2015-11-15 NOTE — Assessment & Plan Note (Signed)
Continue to follow with orthopedics for this.

## 2015-11-16 ENCOUNTER — Other Ambulatory Visit: Payer: Self-pay | Admitting: Family Medicine

## 2015-11-16 LAB — HM DIABETES EYE EXAM

## 2015-11-16 NOTE — Telephone Encounter (Signed)
Patient would like to get 3 bottles at a time of polyethylene glycol powder (GLYCOLAX/MIRALAX) powder okay to refill?  A refill was sent yesterday for 765 g.

## 2015-11-16 NOTE — Telephone Encounter (Signed)
Left message for patient to return call.

## 2015-11-16 NOTE — Telephone Encounter (Signed)
Caller name: Alexis Farmer Relationship to patient: patient Can be reached: [phone]  Reason for call: pt called to request 90 supply of  Glycolax/Miralax powder. [facility]

## 2015-11-16 NOTE — Telephone Encounter (Signed)
Please let the patient know that this is a possible side effect. Per review of drug information it appears this occurs in less than 1% of people. It appears that also on review this is a side effect associated with several other antidepressants. It would be reasonable for the patient not to take this medicine if she feels as though she does not require medication for depression, though she could take it if she would accept the small risk of bleeding and ulcer. If she does not want to take this I would recommend her seeing a therapist for her depression. Please see which she would like to do.

## 2015-11-16 NOTE — Telephone Encounter (Signed)
Please advise for refill, thanks 

## 2015-11-17 NOTE — Telephone Encounter (Signed)
Please determine the size of each bottle and I will send in the appropriate amount.

## 2015-11-18 ENCOUNTER — Telehealth: Payer: Self-pay | Admitting: *Deleted

## 2015-11-18 MED ORDER — POLYETHYLENE GLYCOL 3350 17 GM/SCOOP PO POWD: 17.0000 g | Freq: Every day | ORAL | Status: DC

## 2015-11-18 NOTE — Telephone Encounter (Signed)
It will be okay to order 3 large bottles.

## 2015-11-18 NOTE — Telephone Encounter (Signed)
Noted  

## 2015-11-18 NOTE — Telephone Encounter (Signed)
Patient requested a call in regards to her labs  [phone]

## 2015-11-18 NOTE — Telephone Encounter (Signed)
Spoke with the pharmacist and a small bottle comes in 255 gm and a large bottle comes in 527 gm.  Okay to order 3 large bottles?

## 2015-11-18 NOTE — Telephone Encounter (Signed)
Left message on machine for patient to let her know that a new prescription was sent.

## 2015-11-18 NOTE — Telephone Encounter (Signed)
Patient has decided that she does not want to take the medication at this time.

## 2015-11-18 NOTE — Telephone Encounter (Signed)
Caller name: Remeigh Ditsworth Relationship to patient: patient Can be reached: [phone]  Reason for call:  Pt returned call. Please call pt.

## 2015-12-08 ENCOUNTER — Other Ambulatory Visit: Payer: Self-pay | Admitting: *Deleted

## 2015-12-08 MED ORDER — SERTRALINE HCL 50 MG PO TABS: 25.0000 mg | ORAL_TABLET | Freq: Every day | ORAL | Status: DC

## 2015-12-16 ENCOUNTER — Ambulatory Visit (INDEPENDENT_AMBULATORY_CARE_PROVIDER_SITE_OTHER): Payer: Medicare Other | Admitting: Sports Medicine

## 2015-12-16 ENCOUNTER — Encounter: Payer: Self-pay | Admitting: Sports Medicine

## 2015-12-16 DIAGNOSIS — IMO0001 Reserved for inherently not codable concepts without codable children: Secondary | ICD-10-CM

## 2015-12-16 DIAGNOSIS — Q6650 Congenital pes planus, unspecified foot: Secondary | ICD-10-CM

## 2015-12-16 DIAGNOSIS — Z794 Long term (current) use of insulin: Secondary | ICD-10-CM

## 2015-12-16 DIAGNOSIS — E1165 Type 2 diabetes mellitus with hyperglycemia: Secondary | ICD-10-CM

## 2015-12-16 DIAGNOSIS — M79676 Pain in unspecified toe(s): Secondary | ICD-10-CM | POA: Diagnosis not present

## 2015-12-16 DIAGNOSIS — B351 Tinea unguium: Secondary | ICD-10-CM

## 2015-12-16 NOTE — Progress Notes (Signed)
Patient ID: Alexis Farmer, female   DOB: [birth year], 75 y.o.   MRN: [id number]  Subjective: Alexis Farmer is a 75 y.o. female patient with history of type  2 diabetes and CKD who presents to office today complaining of long, painful nails  while ambulating in shoes; unable to trim. Patient states that the glucose reading was 190. Denies new changes in medication or new problems. Patient denies any new cramping, numbness, burning or tingling in the legs.   Patient Active Problem List   Diagnosis Date Noted  . Chronic fatigue 11/15/2015  . Depression 10/13/2015  . Osteoarthritis 10/13/2015  . Vertigo 10/13/2015  . Anxiety 09/13/2015  . Chronic kidney disease 09/13/2015  . BP (high blood pressure) 09/13/2015  . GI bleed 08/03/2015  . Hyperlipidemia 08/26/2014  . Essential hypertension, benign 04/08/2014  . Diabetes mellitus type II, uncontrolled ([place]) 04/08/2014  . CKD (chronic kidney disease) stage 3, GFR 30-59 ml/min 04/08/2014  . Pedal edema 04/08/2014  . Morbid obesity ([place]) 04/08/2014  . Lower back pain 04/08/2014  . Diabetic polyneuropathy ([place]) 04/08/2014   Current Outpatient Prescriptions on File Prior to Visit  Medication Sig Dispense Refill  . ALPRAZolam (XANAX) 0.5 MG tablet Take 1 tablet (0.5 mg total) by mouth at bedtime as needed for sleep. Reported on 10/13/2015 90 tablet 0  . aspirin 81 MG tablet Take 81 mg by mouth daily.    . B-D INS SYRINGE 0.5CC/30GX1/2" 30G X 1/2" 0.5 ML MISC USE AS DIRECTED 500 each 2  . cloNIDine (CATAPRES) 0.2 MG tablet Take 1 tablet (0.2 mg total) by mouth 2 (two) times daily. 180 tablet 3  . HYDROcodone-acetaminophen (NORCO/VICODIN) 5-325 MG tablet Take 1 tablet by mouth every 6 (six) hours as needed for moderate pain. 20 tablet 0  . hydrocortisone-pramoxine (PROCTOFOAM-HC) rectal foam Place 1 applicator rectally 2 (two) times daily. 10 g 0  . insulin NPH Human (NOVOLIN N) 100 UNIT/ML injection Inject 0.15-0.3 mLs (15-30 Units total) into the  skin 2 (two) times daily before a meal. Takes 30 units am and 15units pm daily. 10 mL 3  . insulin regular (NOVOLIN R) 100 units/mL injection Inject 0.1 mLs (10 Units total) into the skin 3 (three) times daily before meals. 10 mL 3  . meclizine (ANTIVERT) 12.5 MG tablet Take 1 tablet (12.5 mg total) by mouth 3 (three) times daily as needed for dizziness. Reported on 08/03/2015 30 tablet 0  . metoprolol (LOPRESSOR) 50 MG tablet Take 1 tablet (50 mg total) by mouth 2 (two) times daily. 180 tablet 3  . Multiple Vitamin (MULTIVITAMIN WITH MINERALS) TABS tablet Take 1 tablet by mouth daily.    . polyethylene glycol powder (GLYCOLAX/MIRALAX) powder Take 17 g by mouth daily. 1581 g 0  . rosuvastatin (CRESTOR) 10 MG tablet TAKE 1 TABLET (10 MG TOTAL) BY MOUTH DAILY. 90 tablet 3  . sertraline (ZOLOFT) 50 MG tablet Take 0.5 tablets (25 mg total) by mouth daily. 45 tablet 1  . spironolactone (ALDACTONE) 25 MG tablet Take 1 tablet (25 mg total) by mouth daily. 90 tablet 3  . torsemide (DEMADEX) 20 MG tablet Take 1 tablet (20 mg total) by mouth daily. 90 tablet 3  . valsartan (DIOVAN) 320 MG tablet Take 1 tablet (320 mg total) by mouth daily. 90 tablet 3  . verapamil (VERELAN PM) 240 MG 24 hr capsule Take 1 capsule (240 mg total) by mouth at bedtime. 90 capsule 3   No current facility-administered medications on file prior to visit.  Allergies  Allergen Reactions  . Penicillins Hives, Swelling and Other (See Comments)    Has patient had a PCN reaction causing immediate rash, facial/tongue/throat swelling, SOB or lightheadedness with hypotension: Yes Has patient had a PCN reaction causing severe rash involving mucus membranes or skin necrosis: No Has patient had a PCN reaction that required hospitalization No Has patient had a PCN reaction occurring within the last 10 years: Yes If all of the above answers are "NO", then may proceed with Cephalosporin use.      Objective: General: Patient is awake,  alert, and oriented x 3 and in no acute distress. Cane assisted gait  Integument: Skin is warm, dry and supple bilateral. Nails are tender, long, thickened and  dystrophic with subungual debris, consistent with onychomycosis, 1-5 bilateral. No signs of infection. No open lesions or preulcerative lesions present bilateral. Remaining integument unremarkable.  Vasculature:  Dorsalis Pedis pulse 2/4 bilateral. Posterior Tibial pulse  1/4 bilateral.  Capillary fill time <3 sec 1-5 bilateral. Scant hair growth to the level of the digits. Temperature gradient within normal limits. No varicosities present bilateral. No edema present bilateral.   Neurology: The patient has intact sensation measured with a 5.07/10g Semmes Weinstein Monofilament at all pedal sites bilateral . Vibratory sensation slightly diminished bilateral with tuning fork. No Babinski sign present bilateral.   Musculoskeletal: No gross pedal deformities noted bilateral. Muscular strength 5/5 in all lower extremity muscular groups bilateral without pain or limitation on range of motion . No tenderness with calf compression bilateral.  Assessment and Plan: Problem List Items Addressed This Visit      Endocrine   Diabetes mellitus type II, uncontrolled ([place])    Other Visit Diagnoses    Dermatophytosis of nail    -  Primary    Congenital pes planus, unspecified laterality        Pain of toe, unspecified laterality          -Examined patient. -Discussed and educated patient on diabetic foot care, especially with  regards to the vascular, neurological and musculoskeletal systems.  -Stressed the importance of good glycemic control and the detriment of not  controlling glucose levels in relation to the foot. -Mechanically debrided all nails 1-5 bilateral using sterile nail nipper and filed with dremel without incident  -Cont with diabetic shoes with break in slowly; patient states the shoes hurt and she can not wear them but does not  want to return them because says that she has had them too long; shoes are "too hard". -Answered all patient questions -Patient to return in 3 months for at risk foot care -Patient advised to call the office if any problems or questions arise in the meantime.  [name], DPM

## 2015-12-21 ENCOUNTER — Encounter: Payer: Self-pay | Admitting: Family Medicine

## 2015-12-21 ENCOUNTER — Telehealth: Payer: Self-pay | Admitting: Family Medicine

## 2015-12-21 ENCOUNTER — Ambulatory Visit (INDEPENDENT_AMBULATORY_CARE_PROVIDER_SITE_OTHER): Payer: Medicare Other | Admitting: Family Medicine

## 2015-12-21 VITALS — BP 132/64 | HR 69 | Temp 98.5°F | Ht 63.0 in | Wt 275.8 lb

## 2015-12-21 DIAGNOSIS — N183 Chronic kidney disease, stage 3 unspecified: Secondary | ICD-10-CM

## 2015-12-21 DIAGNOSIS — Z794 Long term (current) use of insulin: Secondary | ICD-10-CM

## 2015-12-21 DIAGNOSIS — E1165 Type 2 diabetes mellitus with hyperglycemia: Secondary | ICD-10-CM | POA: Diagnosis not present

## 2015-12-21 DIAGNOSIS — E1122 Type 2 diabetes mellitus with diabetic chronic kidney disease: Secondary | ICD-10-CM

## 2015-12-21 DIAGNOSIS — F329 Major depressive disorder, single episode, unspecified: Secondary | ICD-10-CM | POA: Diagnosis not present

## 2015-12-21 DIAGNOSIS — F32A Depression, unspecified: Secondary | ICD-10-CM

## 2015-12-21 DIAGNOSIS — IMO0002 Reserved for concepts with insufficient information to code with codable children: Secondary | ICD-10-CM

## 2015-12-21 NOTE — Assessment & Plan Note (Signed)
Remains uncontrolled. Discussed options for her insulin though she has has not to change this given fear of hypoglycemia. Offered endocrinology referral although she did not want to do this as she stated she would not follow their recommendations. She will continue her current insulin regimen and follow-up in one month for an A1c and we will rediscuss her insulin regimen at that time.

## 2015-12-21 NOTE — Assessment & Plan Note (Signed)
Weight is stable. Discussed diet and exercise at length. Encouraged whatever exercise she can do. Given diet information.

## 2015-12-21 NOTE — Telephone Encounter (Signed)
The patient called to give the names of the physicians that may have given her a pneumonia shot.   Dr. Enid Derry [facility] -[phone]  Dr. Tracie Harrier -[phone]

## 2015-12-21 NOTE — Patient Instructions (Addendum)
Nice to see you. Please continue your current diabetic regimen with NPH 30 units in the morning and 20 units at night. You can also do regular insulin 10 units with breakfast and lunch. With dinner you can do regular insulin 10 units as well, though can do 15 units if blood glucose greater than 300 as you have been doing. Please continue to monitor your diet. Please start exercising as well. There died injections listed below.  If you develop recurrent depression, thoughts of harming herself or others, sugars less than 80 consistently, or any new or changing symptoms please seek medical attention.  Diet Recommendations  Starchy (carb) foods: Bread, rice, pasta, potatoes, corn, cereal, grits, crackers, bagels, muffins, all baked goods.  (Fruits, milk, and yogurt also have carbohydrate, but most of these foods will not spike your blood sugar as the starchy foods will.)  A few fruits do cause high blood sugars; use small portions of bananas (limit to 1/2 at a time), grapes, watermelon, oranges, and most tropical fruits.    Protein foods: Meat, fish, poultry, eggs, dairy foods, and beans such as pinto and kidney beans (beans also provide carbohydrate).   1. Eat at least 3 meals and 1-2 snacks per day. Never go more than 4-5 hours while awake without eating. Eat breakfast within the first hour of getting up.   2. Limit starchy foods to TWO per meal and ONE per snack. ONE portion of a starchy  food is equal to the following:   - ONE slice of bread (or its equivalent, such as half of a hamburger bun).   - 1/2 cup of a "scoopable" starchy food such as potatoes or rice.   - 15 grams of carbohydrate as shown on food label.  3. Include at every meal: a protein food, a carb food, and vegetables and/or fruit.   - Obtain twice the volume of veg's as protein or carbohydrate foods for both lunch and dinner.   - Fresh or frozen veg's are best.   - Keep frozen veg's on hand for a quick vegetable serving.

## 2015-12-21 NOTE — Progress Notes (Signed)
Pre visit review using our clinic review tool, if applicable. No additional management support is needed unless otherwise documented below in the visit note. 

## 2015-12-21 NOTE — Progress Notes (Signed)
Patient ID: Alexis Farmer, female   DOB: 09/12/1940, 75 y.o.   MRN: [id number]  Tommi Rumps, MD Phone: [phone]  Alexis Farmer is a 75 y.o. female who presents today for follow-up.  DIABETES Disease Monitoring: Blood Sugar ranges-99-232, fasting 100-188, sugars progressively get higher with lunch and dinner, blood sugar log will be scanned into chart Polyuria/phagia/dipsia- no     ophthalmology- reports she saw them in May, she will have them fax records Medications: Compliance- taking NPH 30 units in the morning and 20 units at night, regular insulin 10 units in the morning and lunch, dinner varies between 10 and 15 units depending on what her blood sugar is, if close to 300 she will take 15 units Hypoglycemic symptoms- 1-2 times per week, describes as numbness and tingling in her hands and will drink something and this will go away  Obesity: Does not exercise very much. We'll see vitals at the store occasionally. No fried foods. Has been eating more breads than she should. He eats vegetables with most meals. No sodas though does drink diet juice  Depression: Patient states this is all right. Did not start the Zoloft. No SI. She feels okay at this time. She's getting ready to go on vacation in [place] and is excited about this.   PMH: nonsmoker.   ROS see history of present illness  Objective  Physical Exam Filed Vitals:   12/21/15 0900  BP: 132/64  Pulse: 69  Temp: 98.5 F (36.9 C)    BP Readings from Last 3 Encounters:  12/21/15 132/64  11/15/15 138/76  10/13/15 130/64   Wt Readings from Last 3 Encounters:  12/21/15 275 lb 12.8 oz (125.102 kg)  11/15/15 274 lb 9.6 oz (124.558 kg)  10/13/15 274 lb 9.6 oz (124.558 kg)    Physical Exam  Constitutional: No distress.  HENT:  Head: Normocephalic and atraumatic.  Cardiovascular: Normal rate, regular rhythm and normal heart sounds.   Pulmonary/Chest: Effort normal and breath sounds normal.  Neurological:  She is alert. Gait normal.  Skin: Skin is warm and dry. She is not diaphoretic.     Assessment/Plan: Please see individual problem list.  Depression Improved. She'll continue to monitor. Given return precautions.  Diabetes mellitus type II, uncontrolled Remains uncontrolled. Discussed options for her insulin though she has has not to change this given fear of hypoglycemia. Offered endocrinology referral although she did not want to do this as she stated she would not follow their recommendations. She will continue her current insulin regimen and follow-up in one month for an A1c and we will rediscuss her insulin regimen at that time.  Morbid obesity Weight is stable. Discussed diet and exercise at length. Encouraged whatever exercise she can do. Given diet information.    Tommi Rumps, MD [place]

## 2015-12-21 NOTE — Assessment & Plan Note (Signed)
Improved. She'll continue to monitor. Given return precautions.

## 2015-12-22 ENCOUNTER — Other Ambulatory Visit: Payer: Self-pay | Admitting: Family Medicine

## 2015-12-22 ENCOUNTER — Telehealth: Payer: Self-pay | Admitting: *Deleted

## 2015-12-22 NOTE — Telephone Encounter (Signed)
Can we fill this?

## 2015-12-22 NOTE — Telephone Encounter (Signed)
Patient requested a medication refill for meclizine, 90 day supply Pharmacy [facility]

## 2015-12-22 NOTE — Telephone Encounter (Signed)
Sent for records

## 2015-12-22 NOTE — Telephone Encounter (Signed)
Sent to pharmacy 

## 2015-12-22 NOTE — Telephone Encounter (Signed)
This is pended already in the refill pool, thanks

## 2016-01-25 ENCOUNTER — Ambulatory Visit (INDEPENDENT_AMBULATORY_CARE_PROVIDER_SITE_OTHER): Payer: Medicare Other | Admitting: Family Medicine

## 2016-01-25 ENCOUNTER — Encounter: Payer: Self-pay | Admitting: Family Medicine

## 2016-01-25 DIAGNOSIS — Z794 Long term (current) use of insulin: Secondary | ICD-10-CM

## 2016-01-25 DIAGNOSIS — IMO0002 Reserved for concepts with insufficient information to code with codable children: Secondary | ICD-10-CM

## 2016-01-25 DIAGNOSIS — F329 Major depressive disorder, single episode, unspecified: Secondary | ICD-10-CM

## 2016-01-25 DIAGNOSIS — E1122 Type 2 diabetes mellitus with diabetic chronic kidney disease: Secondary | ICD-10-CM | POA: Diagnosis not present

## 2016-01-25 DIAGNOSIS — F32A Depression, unspecified: Secondary | ICD-10-CM

## 2016-01-25 DIAGNOSIS — R05 Cough: Secondary | ICD-10-CM

## 2016-01-25 DIAGNOSIS — N183 Chronic kidney disease, stage 3 unspecified: Secondary | ICD-10-CM

## 2016-01-25 DIAGNOSIS — R059 Cough, unspecified: Secondary | ICD-10-CM | POA: Insufficient documentation

## 2016-01-25 DIAGNOSIS — E1165 Type 2 diabetes mellitus with hyperglycemia: Secondary | ICD-10-CM | POA: Diagnosis not present

## 2016-01-25 HISTORY — DX: Cough, unspecified: R05.9

## 2016-01-25 LAB — HEMOGLOBIN A1C: Hgb A1c MFr Bld: 9 % — ABNORMAL HIGH (ref 4.6–6.5)

## 2016-01-25 MED ORDER — INSULIN REGULAR HUMAN 100 UNIT/ML IJ SOLN: 10.0000 [IU] | Freq: Three times a day (TID) | INTRAMUSCULAR | 3 refills | Status: DC

## 2016-01-25 MED ORDER — INSULIN NPH (HUMAN) (ISOPHANE) 100 UNIT/ML ~~LOC~~ SUSP: 20.0000 [IU] | Freq: Two times a day (BID) | SUBCUTANEOUS | 3 refills | Status: DC

## 2016-01-25 NOTE — Assessment & Plan Note (Signed)
Slightly better control on CBGs. We'll check an A1c today. She will continue her current medications. She is hesitant to increase any insulin due to fear of hypoglycemia.

## 2016-01-25 NOTE — Progress Notes (Signed)
Tommi Rumps, MD Phone: [phone]  Alexis Farmer is a 75 y.o. female who presents today for follow-up.  DIABETES Disease Monitoring: Blood Sugar ranges-77-238, mostly 125-190  Polyuria/phagia/dipsia- no       Medications: Compliance- taking NPH 33 u in am and 20 u at night, regular insulin 10-15 u with meals depending on blood glucose Hypoglycemic symptoms- none recently  Depression: Notes this is about the same. No SI. Does not want any medications at this time.  Cough: Patient notes for the last several weeks she's had an intermittent cough. Mostly occurs at night. Lasts for about 15-20 minutes and then does not cough any more. Minimal wheezing this morning. Is nonproductive. No upper respiratory congestion or upper respiratory symptoms. No reflux symptoms. No history of asthma or COPD. Does not occur every day. Has not had any shortness of breath with this.  PMH: nonsmoker.   ROS see history of present illness  Objective  Physical Exam Vitals:   01/25/16 0908  BP: 114/66  Pulse: 75  Temp: 98.1 F (36.7 C)    BP Readings from Last 3 Encounters:  01/25/16 114/66  12/21/15 132/64  11/15/15 138/76   Wt Readings from Last 3 Encounters:  01/25/16 276 lb 3.2 oz (125.3 kg)  12/21/15 275 lb 12.8 oz (125.1 kg)  11/15/15 274 lb 9.6 oz (124.6 kg)    Physical Exam  Constitutional: No distress.  HENT:  Head: Normocephalic and atraumatic.  Mouth/Throat: Oropharynx is clear and moist. No oropharyngeal exudate.  Normal TMs bilaterally  Eyes: Conjunctivae are normal. Pupils are equal, round, and reactive to light.  Neck: Neck supple.  Cardiovascular: Normal rate, regular rhythm and normal heart sounds.   Pulmonary/Chest: Effort normal and breath sounds normal.  Lymphadenopathy:    She has no cervical adenopathy.  Neurological: She is alert. Gait normal.  Skin: Skin is warm and dry. She is not diaphoretic.  Psychiatric:  Mood depressed, affect flat      Assessment/Plan: Please see individual problem list.  Diabetes mellitus type II, uncontrolled Slightly better control on CBGs. We'll check an A1c today. She will continue her current medications. She is hesitant to increase any insulin due to fear of hypoglycemia.  Depression Stable from last visit. She wants to continue to monitor.  Cough Relatively nonspecific cough. No other symptoms with this. Doubt related to medications. Could be reactive airways given her report of wheezing this morning though lungs sound clear at this time. Could be reflux though no actual reflux symptoms. Doubt allergies or upper respiratory infection given lack of symptoms. Doubt pulmonary cause given benign exam. Discussed options for management including monitoring, allergy medication, reflux medication, and/or an inhaler. Patient opted to monitor. If not improving would consider trial of one of these treatments versus x-ray of chest.   Orders Placed This Encounter  Procedures  . HgB A1c    Meds ordered this encounter  Medications  . DISCONTD: insulin regular (NOVOLIN R) 100 units/mL injection    Sig: Inject 0.1 mLs (10 Units total) into the skin 3 (three) times daily before meals.    Dispense:  10 mL    Refill:  3  . DISCONTD: insulin NPH Human (NOVOLIN N) 100 UNIT/ML injection    Sig: Inject 0.2-0.33 mLs (20-33 Units total) into the skin 2 (two) times daily before a meal. Takes 33 units am and 20 units pm daily.    Dispense:  20 mL    Refill:  3  . insulin regular (NOVOLIN R) 100  units/mL injection    Sig: Inject 0.1 mLs (10 Units total) into the skin 3 (three) times daily before meals.    Dispense:  20 mL    Refill:  3  . insulin NPH Human (NOVOLIN N) 100 UNIT/ML injection    Sig: Inject 0.2-0.33 mLs (20-33 Units total) into the skin 2 (two) times daily before a meal. Takes 33 units am and 20 units pm daily.    Dispense:  30 mL    Refill:  Warson Woods, MD [place]

## 2016-01-25 NOTE — Assessment & Plan Note (Signed)
Stable from last visit. She wants to continue to monitor.

## 2016-01-25 NOTE — Assessment & Plan Note (Signed)
Relatively nonspecific cough. No other symptoms with this. Doubt related to medications. Could be reactive airways given her report of wheezing this morning though lungs sound clear at this time. Could be reflux though no actual reflux symptoms. Doubt allergies or upper respiratory infection given lack of symptoms. Doubt pulmonary cause given benign exam. Discussed options for management including monitoring, allergy medication, reflux medication, and/or an inhaler. Patient opted to monitor. If not improving would consider trial of one of these treatments versus x-ray of chest.

## 2016-01-25 NOTE — Progress Notes (Signed)
Pre visit review using our clinic review tool, if applicable. No additional management support is needed unless otherwise documented below in the visit note. 

## 2016-01-25 NOTE — Patient Instructions (Signed)
Nice to see you. Please continue to monitor your cough. You could try using a humidifier to see if this will be of benefit. I refilled your insulin.  Please continue to monitor the depression and if this worsens let us know.

## 2016-01-27 ENCOUNTER — Telehealth: Payer: Self-pay | Admitting: Family Medicine

## 2016-01-27 NOTE — Telephone Encounter (Signed)
Pt lvm stating that she wants dr. Ellen Henri nurse to call her back. She did not state what it was in regards for.

## 2016-01-27 NOTE — Telephone Encounter (Signed)
I spoke with [place] and she noted that she did not want to get involved with this and that she would let her grandmother know this.   Please let the patient know that we will place information at the front regarding lantus for the patient to pick up. This is a once daily medication that provides a constant level of insulin throughout the day.

## 2016-01-27 NOTE — Telephone Encounter (Signed)
Patient is returning your call. She states you can call her back. Her number is [phone]. Thank you

## 2016-01-27 NOTE — Telephone Encounter (Signed)
I will print out information and give this to [name].

## 2016-01-27 NOTE — Telephone Encounter (Signed)
Called patient but was unable to leave message

## 2016-01-27 NOTE — Telephone Encounter (Signed)
Spoke to patient, I was instructed to give Alexis Farmer the info for her to bring it to patient.

## 2016-01-27 NOTE — Telephone Encounter (Signed)
Patient is requesting that Dr. Frederich Cha new diabetic medication to [place] in the office to let her know what it does and any information would be very helpful. Lydia Guiles is patient granddaughter.

## 2016-02-03 ENCOUNTER — Other Ambulatory Visit: Payer: Self-pay | Admitting: Cardiovascular Disease

## 2016-02-03 ENCOUNTER — Other Ambulatory Visit: Payer: Self-pay | Admitting: Family Medicine

## 2016-02-03 NOTE — Telephone Encounter (Signed)
Can we fill this?

## 2016-02-07 ENCOUNTER — Encounter: Payer: Self-pay | Admitting: Sports Medicine

## 2016-02-07 ENCOUNTER — Ambulatory Visit (INDEPENDENT_AMBULATORY_CARE_PROVIDER_SITE_OTHER): Payer: Medicare Other

## 2016-02-07 ENCOUNTER — Ambulatory Visit (INDEPENDENT_AMBULATORY_CARE_PROVIDER_SITE_OTHER): Payer: Medicare Other | Admitting: Sports Medicine

## 2016-02-07 DIAGNOSIS — M79672 Pain in left foot: Secondary | ICD-10-CM | POA: Diagnosis not present

## 2016-02-07 DIAGNOSIS — IMO0001 Reserved for inherently not codable concepts without codable children: Secondary | ICD-10-CM

## 2016-02-07 DIAGNOSIS — Z794 Long term (current) use of insulin: Secondary | ICD-10-CM

## 2016-02-07 DIAGNOSIS — M722 Plantar fascial fibromatosis: Secondary | ICD-10-CM | POA: Diagnosis not present

## 2016-02-07 DIAGNOSIS — E1165 Type 2 diabetes mellitus with hyperglycemia: Secondary | ICD-10-CM | POA: Diagnosis not present

## 2016-02-07 MED ORDER — METHYLPREDNISOLONE 4 MG PO TBPK: ORAL_TABLET | ORAL | 0 refills | Status: DC

## 2016-02-07 MED ORDER — TRIAMCINOLONE ACETONIDE 10 MG/ML IJ SUSP: 10.0000 mg | Freq: Once | INTRAMUSCULAR | Status: DC

## 2016-02-07 NOTE — Patient Instructions (Signed)

## 2016-02-07 NOTE — Progress Notes (Signed)
Subjective: Alexis Farmer is a 75 y.o. Diabetic female patient presents to office with complaint of heel pain on the left x a few days; worse pain on yesterday where she could not put weight on her heel. Patient applied heat. Denies any other pedal complaints.   Patient Active Problem List   Diagnosis Date Noted  . Cough 01/25/2016  . Chronic fatigue 11/15/2015  . Depression 10/13/2015  . Osteoarthritis 10/13/2015  . Vertigo 10/13/2015  . Anxiety 09/13/2015  . Chronic kidney disease 09/13/2015  . BP (high blood pressure) 09/13/2015  . GI bleed 08/03/2015  . Hyperlipidemia 08/26/2014  . Essential hypertension, benign 04/08/2014  . Diabetes mellitus type II, uncontrolled ([place]) 04/08/2014  . CKD (chronic kidney disease) stage 3, GFR 30-59 ml/min 04/08/2014  . Pedal edema 04/08/2014  . Morbid obesity ([place]) 04/08/2014  . Lower back pain 04/08/2014  . Diabetic polyneuropathy ([place]) 04/08/2014    Current Outpatient Prescriptions on File Prior to Visit  Medication Sig Dispense Refill  . ALPRAZolam (XANAX) 0.5 MG tablet Take 1 tablet (0.5 mg total) by mouth at bedtime as needed for sleep. Reported on 10/13/2015 (Patient not taking: Reported on 01/25/2016) 90 tablet 0  . aspirin 81 MG tablet Take 81 mg by mouth daily.    . B-D INS SYRINGE 0.5CC/30GX1/2" 30G X 1/2" 0.5 ML MISC USE AS DIRECTED 500 each 2  . cloNIDine (CATAPRES) 0.2 MG tablet Take 1 tablet (0.2 mg total) by mouth 2 (two) times daily. 180 tablet 3  . cloNIDine (CATAPRES) 0.2 MG tablet TAKE 1 TABLET (0.2 MG TOTAL) BY MOUTH 2 (TWO) TIMES DAILY. 180 tablet 3  . insulin NPH Human (NOVOLIN N) 100 UNIT/ML injection Inject 0.2-0.33 mLs (20-33 Units total) into the skin 2 (two) times daily before a meal. Takes 33 units am and 20 units pm daily. 30 mL 3  . insulin regular (NOVOLIN R) 100 units/mL injection Inject 0.1 mLs (10 Units total) into the skin 3 (three) times daily before meals. 20 mL 3  . meclizine (ANTIVERT) 12.5 MG tablet TAKE 1  TABLET BY MOUTH 3 TIMES DAILY AS NEEDED FOR DIZZINESS 30 tablet 0  . metoprolol (LOPRESSOR) 50 MG tablet Take 1 tablet (50 mg total) by mouth 2 (two) times daily. 180 tablet 3  . Multiple Vitamin (MULTIVITAMIN WITH MINERALS) TABS tablet Take 1 tablet by mouth daily.    . polyethylene glycol powder (GLYCOLAX/MIRALAX) powder Take 17 g by mouth daily. 1581 g 0  . rosuvastatin (CRESTOR) 10 MG tablet TAKE 1 TABLET (10 MG TOTAL) BY MOUTH DAILY. 90 tablet 3  . sertraline (ZOLOFT) 50 MG tablet Take 0.5 tablets (25 mg total) by mouth daily. 45 tablet 1  . spironolactone (ALDACTONE) 25 MG tablet Take 1 tablet (25 mg total) by mouth daily. 90 tablet 3  . torsemide (DEMADEX) 20 MG tablet Take 1 tablet (20 mg total) by mouth daily. 90 tablet 3  . valsartan (DIOVAN) 320 MG tablet Take 1 tablet (320 mg total) by mouth daily. 90 tablet 3  . verapamil (VERELAN PM) 240 MG 24 hr capsule Take 1 capsule (240 mg total) by mouth at bedtime. 90 capsule 3   No current facility-administered medications on file prior to visit.     Allergies  Allergen Reactions  . Penicillins Hives, Swelling and Other (See Comments)    Has patient had a PCN reaction causing immediate rash, facial/tongue/throat swelling, SOB or lightheadedness with hypotension: Yes Has patient had a PCN reaction causing severe rash involving mucus membranes  or skin necrosis: No Has patient had a PCN reaction that required hospitalization No Has patient had a PCN reaction occurring within the last 10 years: Yes If all of the above answers are "NO", then may proceed with Cephalosporin use.     Objective: Physical Exam General: The patient is alert and oriented x3 in no acute distress.  Dermatology: Skin is warm, dry and supple bilateral lower extremities. Nails 1-10 are short and thick, asymptomatic. There is no erythema, edema, no eccymosis, no open lesions present. Integument is otherwise unremarkable.  Vascular: Dorsalis Pedis pulse and Posterior  Tibial pulse are 1/4 bilateral. Capillary fill time is immediate to all digits. Trace edema bilateral.   Neurological: Grossly intact to light touch with an achilles reflex of +2/5 and a negative Tinel's sign bilateral. Protective with SWMF intact. Vibratory diminished bilateral.   Musculoskeletal: Tenderness to palpation at the medial calcaneal tubercale and through the insertion of the plantar fascia on the left foot. No pain with compression of calcaneus bilateral. No pain with tuning fork to calcaneus bilateral. No pain with calf compression bilateral. There is decreased Ankle joint range of motion bilateral. All other joints range of motion within normal limits bilateral. Strength 5/5 in all groups bilateral. Pes planus foot type.   Xray, Left foot:  Normal osseous mineralization. Joint spaces preserved. No fracture/dislocation/boney destruction. Calcaneal spur present with mild thickening of plantar fascia. Planus foot type. No other soft tissue abnormalities or radiopaque foreign bodies.   Assessment and Plan: Problem List Items Addressed This Visit      Endocrine   Diabetes mellitus type II, uncontrolled ([place])   Relevant Medications   methylPREDNISolone (MEDROL DOSEPAK) 4 MG TBPK tablet    Other Visit Diagnoses    Left foot pain    -  Primary   Relevant Medications   methylPREDNISolone (MEDROL DOSEPAK) 4 MG TBPK tablet   Other Relevant Orders   DG Foot 2 Views Left   Plantar fasciitis of left foot       Relevant Medications   methylPREDNISolone (MEDROL DOSEPAK) 4 MG TBPK tablet   triamcinolone acetonide (KENALOG) 10 MG/ML injection 10 mg (Start on 02/07/2016 11:45 AM)      -Complete examination performed.  -Xrays reviewed -Discussed with patient in detail the condition of plantar fasciitis, how this occurs and general treatment options. Explained both conservative and surgical treatments.  -After oral consent and aseptic prep, injected a mixture containing 1 ml of 2% plain  lidocaine, 1 ml 0.5% plain marcaine, 0.5 ml of kenalog 10 and 0.5 ml of dexamethasone phosphate into left heel at plantar fascia. Post-injection care discussed with patient.  -Rx Medrol dose pack. No NSAIDS due to CKD history. Advised to monitor sugar while on steroids and to adjust insulin  -Recommended good supportive shoes daily -Explained and dispensed to patient daily stretching exercises. -Recommend patient to ice affected area 1-2x daily. -Patient to return to office as needed for follow up or sooner if problems or questions arise.  [name], DPM

## 2016-03-07 ENCOUNTER — Other Ambulatory Visit: Payer: Self-pay | Admitting: Surgical

## 2016-03-07 ENCOUNTER — Other Ambulatory Visit: Payer: Self-pay | Admitting: Family Medicine

## 2016-03-07 ENCOUNTER — Telehealth: Payer: Self-pay | Admitting: Family Medicine

## 2016-03-07 DIAGNOSIS — E1122 Type 2 diabetes mellitus with diabetic chronic kidney disease: Secondary | ICD-10-CM

## 2016-03-07 DIAGNOSIS — E1165 Type 2 diabetes mellitus with hyperglycemia: Principal | ICD-10-CM

## 2016-03-07 DIAGNOSIS — Z794 Long term (current) use of insulin: Principal | ICD-10-CM

## 2016-03-07 DIAGNOSIS — N183 Chronic kidney disease, stage 3 unspecified: Secondary | ICD-10-CM

## 2016-03-07 DIAGNOSIS — IMO0002 Reserved for concepts with insufficient information to code with codable children: Secondary | ICD-10-CM

## 2016-03-07 MED ORDER — INSULIN REGULAR HUMAN 100 UNIT/ML IJ SOLN
15.0000 [IU] | Freq: Three times a day (TID) | INTRAMUSCULAR | 2 refills | Status: DC
Start: 2016-03-07 — End: 2016-06-27

## 2016-03-07 MED ORDER — INSULIN REGULAR HUMAN 100 UNIT/ML IJ SOLN: 10.0000 [IU] | Freq: Three times a day (TID) | INTRAMUSCULAR | 2 refills | Status: DC

## 2016-03-07 MED ORDER — INSULIN NPH (HUMAN) (ISOPHANE) 100 UNIT/ML ~~LOC~~ SUSP: 20.0000 [IU] | Freq: Two times a day (BID) | SUBCUTANEOUS | 3 refills | Status: DC

## 2016-03-07 NOTE — Telephone Encounter (Signed)
Pt states that she needs a refill on insulin regular (NOVOLIN R) 100 units/mL injection she is using more because of her sugar levels.. pharmacy stated that the direction also needed to be changed.. Please advise

## 2016-03-07 NOTE — Telephone Encounter (Signed)
Resent correct dosage to pharmacy

## 2016-03-07 NOTE — Telephone Encounter (Signed)
Please advise 

## 2016-03-07 NOTE — Telephone Encounter (Signed)
Please advise on what this patient should be taking. We have resent RX twice today and they have said that it is wrong.

## 2016-03-07 NOTE — Telephone Encounter (Signed)
[facility] pharmacy called stating that the wrong medication was sent for pt. They received the Novolin N and it should be the Novolin R. They will also need updated directions.

## 2016-03-07 NOTE — Telephone Encounter (Signed)
Please contact the patient to find out if she is taking novolin R and how many units. Please also find out if she is taking novolin N (NPH) and how many units. If she is unable to tell us the exact amounts and how often she will need an appointment and will need to bring in her medications. Thanks.

## 2016-03-07 NOTE — Telephone Encounter (Signed)
New prescription sent to pharmacy. Patient should follow-up sometime in the next month.

## 2016-03-07 NOTE — Telephone Encounter (Signed)
Spoke with patient and gathered the information for her medication. She is taking the Novolin R 15 units 3 times a day. She increased it by 5 units at the first of the month. Her sugars was running between 289 and 314. Since the increase her numbers are running between 140 to 190. She also takes the Novolin N 33 units once a day and 25 units once a day. She is needing a refill of the Novolin R due to her increasing the medication by 5 units three times a day. I advised her that she needs to call us when she increases her medication so we will have record of this.

## 2016-03-08 NOTE — Telephone Encounter (Signed)
Noted thank you

## 2016-03-08 NOTE — Telephone Encounter (Signed)
Pt is currently out of town, she stated that she will reschedule once she's back in town in three weeks .

## 2016-03-08 NOTE — Telephone Encounter (Signed)
Can you please call and schedule patient for 30 minute appointment.

## 2016-03-12 ENCOUNTER — Other Ambulatory Visit: Payer: Self-pay | Admitting: Family Medicine

## 2016-03-12 NOTE — Telephone Encounter (Signed)
Can we refill this? 

## 2016-04-02 ENCOUNTER — Other Ambulatory Visit: Payer: Self-pay | Admitting: Family Medicine

## 2016-04-02 NOTE — Telephone Encounter (Signed)
Please determine if the patient is taking this medication and how often. Please also see the reason that she takes this medication.

## 2016-04-02 NOTE — Telephone Encounter (Signed)
Please advise on refill.

## 2016-04-03 NOTE — Telephone Encounter (Signed)
Spoke with patient about RX . She stated that she takes this every night for sleep. She only has two tablets left .

## 2016-04-03 NOTE — Telephone Encounter (Signed)
Please call refill in to the patient's pharmacy.

## 2016-04-04 MED ORDER — ALPRAZOLAM 0.5 MG PO TABS: ORAL_TABLET | ORAL | 1 refills | Status: DC

## 2016-04-04 NOTE — Telephone Encounter (Signed)
RX called in to the pharmacy.  

## 2016-04-20 ENCOUNTER — Ambulatory Visit (INDEPENDENT_AMBULATORY_CARE_PROVIDER_SITE_OTHER): Payer: Medicare Other | Admitting: Podiatry

## 2016-04-20 VITALS — BP 121/54 | HR 65 | Resp 16

## 2016-04-20 DIAGNOSIS — M7752 Other enthesopathy of left foot: Secondary | ICD-10-CM

## 2016-04-20 DIAGNOSIS — B351 Tinea unguium: Secondary | ICD-10-CM

## 2016-04-20 DIAGNOSIS — L608 Other nail disorders: Secondary | ICD-10-CM

## 2016-04-20 DIAGNOSIS — M659 Synovitis and tenosynovitis, unspecified: Secondary | ICD-10-CM

## 2016-04-20 DIAGNOSIS — M65972 Unspecified synovitis and tenosynovitis, left ankle and foot: Secondary | ICD-10-CM

## 2016-04-20 DIAGNOSIS — M25572 Pain in left ankle and joints of left foot: Secondary | ICD-10-CM

## 2016-04-20 DIAGNOSIS — E0843 Diabetes mellitus due to underlying condition with diabetic autonomic (poly)neuropathy: Secondary | ICD-10-CM

## 2016-04-20 DIAGNOSIS — L603 Nail dystrophy: Secondary | ICD-10-CM

## 2016-04-20 DIAGNOSIS — M79609 Pain in unspecified limb: Secondary | ICD-10-CM

## 2016-04-21 MED ORDER — BETAMETHASONE SOD PHOS & ACET 6 (3-3) MG/ML IJ SUSP: 3.0000 mg | Freq: Once | INTRAMUSCULAR | Status: DC

## 2016-04-21 NOTE — Progress Notes (Signed)
SUBJECTIVE Patient with a history of diabetes mellitus presents to office today complaining of elongated, thickened nails. Pain while ambulating in shoes. Patient is unable to trim their own nails.  Patient also has a new complaint of pain and tenderness to the left ankle joint. Patient states the pains been going on for several months  Allergies  Allergen Reactions  . Penicillins Hives, Swelling and Other (See Comments)    Has patient had a PCN reaction causing immediate rash, facial/tongue/throat swelling, SOB or lightheadedness with hypotension: Yes Has patient had a PCN reaction causing severe rash involving mucus membranes or skin necrosis: No Has patient had a PCN reaction that required hospitalization No Has patient had a PCN reaction occurring within the last 10 years: Yes If all of the above answers are "NO", then may proceed with Cephalosporin use.     OBJECTIVE General Patient is awake, alert, and oriented x 3 and in no acute distress. Derm Skin is dry and supple bilateral. Negative open lesions or macerations. Remaining integument unremarkable. Nails are tender, long, thickened and dystrophic with subungual debris, consistent with onychomycosis, 1-5 bilateral. No signs of infection noted. Vasc  DP and PT pedal pulses palpable bilaterally. Temperature gradient within normal limits.  Neuro Epicritic and protective threshold sensation diminished bilaterally.  Musculoskeletal Exam Pain on palpation noted to the anterior lateral medial aspect of the patient's left ankle. No symptomatic pedal deformities noted bilateral. Muscular strength within normal limits.  ASSESSMENT 1. Diabetes Mellitus w/ peripheral neuropathy 2. Onychomycosis of nail due to dermatophyte bilateral 3. Pain in foot bilateral 4. Ankle joint synovitis left 5. Capsulitis left ankle 6. Pain in left ankle  PLAN OF CARE 1. Patient evaluated today. 2. Instructed to maintain good pedal hygiene and foot care.  Stressed importance of controlling blood sugar.  3. Mechanical debridement of nails 1-5 bilaterally performed using a nail nipper. Filed with dremel without incident.  4. Injection of 0.5 mL Celestone Soluspan injected into the patient's left ankle joint without incident. 5. Return to clinic in 3 mos.    Edrick Kins, DPM

## 2016-04-30 ENCOUNTER — Ambulatory Visit (INDEPENDENT_AMBULATORY_CARE_PROVIDER_SITE_OTHER): Payer: Medicare Other

## 2016-04-30 ENCOUNTER — Ambulatory Visit (INDEPENDENT_AMBULATORY_CARE_PROVIDER_SITE_OTHER): Payer: Medicare Other | Admitting: Family Medicine

## 2016-04-30 ENCOUNTER — Encounter: Payer: Self-pay | Admitting: Family Medicine

## 2016-04-30 VITALS — BP 118/64 | HR 61 | Temp 98.7°F | Resp 18 | Wt 275.5 lb

## 2016-04-30 DIAGNOSIS — N183 Chronic kidney disease, stage 3 unspecified: Secondary | ICD-10-CM

## 2016-04-30 DIAGNOSIS — E1165 Type 2 diabetes mellitus with hyperglycemia: Secondary | ICD-10-CM | POA: Diagnosis not present

## 2016-04-30 DIAGNOSIS — R05 Cough: Secondary | ICD-10-CM

## 2016-04-30 DIAGNOSIS — R059 Cough, unspecified: Secondary | ICD-10-CM

## 2016-04-30 DIAGNOSIS — IMO0002 Reserved for concepts with insufficient information to code with codable children: Secondary | ICD-10-CM

## 2016-04-30 DIAGNOSIS — Z794 Long term (current) use of insulin: Secondary | ICD-10-CM

## 2016-04-30 DIAGNOSIS — E1122 Type 2 diabetes mellitus with diabetic chronic kidney disease: Secondary | ICD-10-CM

## 2016-04-30 MED ORDER — HYDROCODONE-HOMATROPINE 5-1.5 MG/5ML PO SYRP: 5.0000 mL | ORAL_SOLUTION | Freq: Three times a day (TID) | ORAL | 0 refills | Status: DC | PRN

## 2016-04-30 MED ORDER — AZITHROMYCIN 250 MG PO TABS: ORAL_TABLET | ORAL | 0 refills | Status: DC

## 2016-04-30 MED ORDER — ALBUTEROL SULFATE HFA 108 (90 BASE) MCG/ACT IN AERS: 2.0000 | INHALATION_SPRAY | Freq: Four times a day (QID) | RESPIRATORY_TRACT | 0 refills | Status: DC | PRN

## 2016-04-30 NOTE — Assessment & Plan Note (Signed)
Patient with cough over the last 2 weeks. Not improving and not worsening. Suspect bronchitis given symptomatology. Doubt pneumonia given benign lung exam. Vital signs stable. We will obtain a chest x-ray given persistent symptoms with trouble breathing with cough. We'll start on azithromycin given persistence. We will provide her with an albuterol inhaler as well. Hycodan for cough. Advised that this may make her drowsy. Given return precautions.

## 2016-04-30 NOTE — Patient Instructions (Signed)
Nice to see you. You likely have bronchitis. We will obtain a chest x-ray to rule out other causes. We will start you on azithromycin and an albuterol inhaler. You may use the albuterol inhaler as needed as prescribed for wheezing. You can also use the Hycodan for cough. This may cause some drowsiness so try to only take it at night. Please take a probiotic with the antibiotic. Please continue to take your diabetes medication and we will request labs from your nephrologist. If you develop chest pain, shortness of breath, cough productive of blood, fevers, or any new or changing symptoms please seek medical attention immediately.

## 2016-04-30 NOTE — Assessment & Plan Note (Signed)
The morning and lunchtime blood sugars appear to be relatively well controlled compared to previously. Evening blood sugars slightly elevated. She reports she recently had lab work through her nephrologist and states they did an A1c. We will request that lab work from them. She'll continue her current diabetes regimen until we have lab work results.

## 2016-04-30 NOTE — Progress Notes (Signed)
Tommi Rumps, MD Phone: [phone]  Alexis Farmer is a 75 y.o. female who presents today for follow-up.  Patient notes for the last week she's had cough. Gets coughing spasms and has some trouble breathing with the coughing spasms. No other shortness of breath. No chest pain. Notes some mild chest congestion. Some chills and body aches. Notes her lower ribs are sore from coughing. No hemoptysis. Notes rare postnasal drip. No reflux. No sick contacts. She has been doing over-the-counter tussin. Does note a little diarrhea with this though no blood in her stool. No abdominal pain. No vomiting. Status post cholecystectomy and appendectomy. No orthopnea or PND. Is taking her torsemide. No weight gain.  DIABETES Disease Monitoring: Blood Sugar ranges-80-299, mostly less than 200 Polyuria/phagia/dipsia- no      ophthalmology- up-to-date Medications: Compliance- taking NPH 33 units in the morning and 20 units at night, regular insulin 15 units 3 times a day and occasionally supplementing at dinner with extra 5 units Hypoglycemic symptoms- rare, only occurs if she does not eat anything, she eats something and this improves.   PMH: nonsmoker.   ROS see history of present illness  Objective  Physical Exam Vitals:   04/30/16 0915  BP: 118/64  Pulse: 61  Resp: 18  Temp: 98.7 F (37.1 C)    BP Readings from Last 3 Encounters:  04/30/16 118/64  04/20/16 (!) 121/54  01/25/16 114/66   Wt Readings from Last 3 Encounters:  04/30/16 275 lb 8 oz (125 kg)  01/25/16 276 lb 3.2 oz (125.3 kg)  12/21/15 275 lb 12.8 oz (125.1 kg)    Physical Exam  Constitutional: No distress.  HENT:  Head: Normocephalic and atraumatic.  Mouth/Throat: Oropharynx is clear and moist. No oropharyngeal exudate.  Cardiovascular: Normal rate, regular rhythm and normal heart sounds.   Pulmonary/Chest: Effort normal and breath sounds normal.  Bilateral lower ribs and xiphoid process mildly tender to  palpation, no palpable defects  Abdominal: Soft. Bowel sounds are normal. She exhibits no distension. There is no tenderness. There is no rebound and no guarding.  Musculoskeletal: She exhibits no edema.  Neurological: She is alert.  Skin: Skin is warm and dry. She is not diaphoretic.     Assessment/Plan: Please see individual problem list.  Diabetes mellitus type II, uncontrolled The morning and lunchtime blood sugars appear to be relatively well controlled compared to previously. Evening blood sugars slightly elevated. She reports she recently had lab work through her nephrologist and states they did an A1c. We will request that lab work from them. She'll continue her current diabetes regimen until we have lab work results.  Cough Patient with cough over the last 2 weeks. Not improving and not worsening. Suspect bronchitis given symptomatology. Doubt pneumonia given benign lung exam. Vital signs stable. We will obtain a chest x-ray given persistent symptoms with trouble breathing with cough. We'll start on azithromycin given persistence. We will provide her with an albuterol inhaler as well. Hycodan for cough. Advised that this may make her drowsy. Given return precautions.   Orders Placed This Encounter  Procedures  . DG Chest 2 View    Standing Status:   Future    Standing Expiration Date:   06/30/2017    Order Specific Question:   Reason for Exam (SYMPTOM  OR DIAGNOSIS REQUIRED)    Answer:   cough, wheezing, chills, x2 weeks, not improving    Order Specific Question:   Preferred imaging location?    Answer:   [facility]  Meds ordered this encounter  Medications  . azithromycin (ZITHROMAX) 250 MG tablet    Sig: Take 500 mg (2 tablets) by mouth today, then take 250 mg (one tablet) by mouth daily for 4 days    Dispense:  6 tablet    Refill:  0  . albuterol (PROVENTIL HFA;VENTOLIN HFA) 108 (90 Base) MCG/ACT inhaler    Sig: Inhale 2 puffs into the lungs every 6  (six) hours as needed for wheezing or shortness of breath.    Dispense:  1 Inhaler    Refill:  0  . HYDROcodone-homatropine (HYCODAN) 5-1.5 MG/5ML syrup    Sig: Take 5 mLs by mouth every 8 (eight) hours as needed for cough.    Dispense:  120 mL    Refill:  0   Tommi Rumps, MD [place]

## 2016-04-30 NOTE — Progress Notes (Signed)
Pre visit review using our clinic review tool, if applicable. No additional management support is needed unless otherwise documented below in the visit note. 

## 2016-05-04 ENCOUNTER — Telehealth: Payer: Self-pay | Admitting: *Deleted

## 2016-05-04 NOTE — Telephone Encounter (Signed)
Noted  

## 2016-05-04 NOTE — Telephone Encounter (Signed)
Patient needs to be re-evaluated today. We do not have any availability and thus I would recommend checking for open slots within [place] or having her go to the walk in clinic at [place] or an urgent care.

## 2016-05-04 NOTE — Telephone Encounter (Signed)
Spoke with patient states she is finishing Z-pak still coughing feels dizzy at times, denies fever.    She states she is using albuterol inhaler it is helping however still continuing to wheeze.   She was unable to get cough syrup due to stomach issue.  Called pharmacy and and double checked with them and they just have script on hold.    Please advise.

## 2016-05-04 NOTE — Telephone Encounter (Signed)
Patient was seen in the office on 11/13, she was giving medication for her wheezing.Pt took her last pill this morning and not feeling any better . She requested a call in reference to the next step of care.  pt contact [phone]

## 2016-05-04 NOTE — Telephone Encounter (Signed)
Spoke with patient advised of below .  She states if she doesn't feel any better tomorrow, she go to urgent care to be evaluated.

## 2016-05-07 ENCOUNTER — Telehealth: Payer: Self-pay | Admitting: *Deleted

## 2016-05-07 NOTE — Telephone Encounter (Signed)
Patient stated that she went to [facility] walk in and they prescribed prednisone to "clear up her lungs". She states that she is feeling better.

## 2016-05-07 NOTE — Telephone Encounter (Signed)
FYI -Pt wanted to FYI Dr. Caryl Bis that she was seen at the walk- in this weekend. She was prescribed 4 pills for prednisone 20 mg.

## 2016-05-07 NOTE — Telephone Encounter (Signed)
Noted. I am glad she is feeling better. If she starts to feel worse again she should let us know.

## 2016-05-21 ENCOUNTER — Ambulatory Visit (INDEPENDENT_AMBULATORY_CARE_PROVIDER_SITE_OTHER): Payer: Commercial Indemnity | Admitting: Vascular Surgery

## 2016-05-21 ENCOUNTER — Encounter (INDEPENDENT_AMBULATORY_CARE_PROVIDER_SITE_OTHER): Payer: Commercial Indemnity

## 2016-05-31 ENCOUNTER — Ambulatory Visit (INDEPENDENT_AMBULATORY_CARE_PROVIDER_SITE_OTHER): Payer: Medicare Other | Admitting: Cardiovascular Disease

## 2016-05-31 ENCOUNTER — Encounter: Payer: Self-pay | Admitting: Cardiovascular Disease

## 2016-05-31 VITALS — BP 146/73 | HR 62 | Ht 63.0 in | Wt 276.8 lb

## 2016-05-31 DIAGNOSIS — E782 Mixed hyperlipidemia: Secondary | ICD-10-CM

## 2016-05-31 DIAGNOSIS — I10 Essential (primary) hypertension: Secondary | ICD-10-CM

## 2016-05-31 DIAGNOSIS — N183 Chronic kidney disease, stage 3 unspecified: Secondary | ICD-10-CM

## 2016-05-31 DIAGNOSIS — R600 Localized edema: Secondary | ICD-10-CM

## 2016-05-31 DIAGNOSIS — E1122 Type 2 diabetes mellitus with diabetic chronic kidney disease: Secondary | ICD-10-CM

## 2016-05-31 DIAGNOSIS — Z794 Long term (current) use of insulin: Secondary | ICD-10-CM

## 2016-05-31 DIAGNOSIS — IMO0002 Reserved for concepts with insufficient information to code with codable children: Secondary | ICD-10-CM

## 2016-05-31 DIAGNOSIS — E1165 Type 2 diabetes mellitus with hyperglycemia: Secondary | ICD-10-CM

## 2016-05-31 DIAGNOSIS — I5032 Chronic diastolic (congestive) heart failure: Secondary | ICD-10-CM

## 2016-05-31 MED ORDER — TORSEMIDE 20 MG PO TABS: 20.0000 mg | ORAL_TABLET | Freq: Two times a day (BID) | ORAL | 3 refills | Status: DC

## 2016-05-31 NOTE — Progress Notes (Signed)
Cardiology Office Note  Date:  05/31/2016   ID:  Alexis Farmer, DOB [birth year], MRN [id number]  PCP:  Tommi Rumps, MD   Chief Complaint  Patient presents with  . other    1 yr f/u c/o fatigue. Meds reviewed verbally with pt.    HPI:  Alexis Farmer is a 75 year old woman with history of obesity, hypertension, hyperlipidemia, moderate LVH with recent admission to the hospital 08/13/2014 with discharge February 27 with symptoms of chest pain, malignant hypertension, possible sleep apnea, insulin-dependent diabetes, chronic kidney disease, anxiety. She presents to for follow-up of her hypertension Notes indicate previous history of GI bleeding  colonoscopy which was normal by her report  In follow-up today she reports getting over upper respiratory infection Still with cough at times, gags "asthma" breathing, torsemide was increased, and ABX Symptoms seem to resolve Nowtakes torsemide daily Still with Significant leg swelling BMP reviewed, essentially normal  Hemoglobin A1c greater than 9, poor diet, difficulty losing weight  Some gait instability, no regular exercise  EKG on today's visit shows normal sinus rhythm with rate 62 bpm, poor R-wave progression through the anterior precordial leads, left axis deviation  Previous Echocardiogram 08/13/2014 showed normal ejection fraction, normal RV size and function, moderate LVH  Other past medical history Renal artery duplex showing no blockage of the mid to distal vessels bilaterally, challenging study   PMH:   has a past medical history of Arthritis; Chickenpox; Diabetes mellitus without complication ([place]); Diverticulitis; GI bleed; High cholesterol; History of blood transfusion; Hypertension; and Renal insufficiency.  PSH:    Past Surgical History:  Procedure Laterality Date  . APPENDECTOMY    . ECTOPIC PREGNANCY SURGERY    . gallbladder     . THYROIDECTOMY, PARTIAL      Current Outpatient Prescriptions   Medication Sig Dispense Refill  . albuterol (PROVENTIL HFA;VENTOLIN HFA) 108 (90 Base) MCG/ACT inhaler Inhale 2 puffs into the lungs every 6 (six) hours as needed for wheezing or shortness of breath. 1 Inhaler 0  . ALPRAZolam (XANAX) 0.5 MG tablet TAKE 1 TABLET BY MOUTH NIGHTLY AS NEEDED FOR SLEEP 30 tablet 1  . aspirin 81 MG tablet Take 81 mg by mouth daily.    . B-D INS SYRINGE 0.5CC/30GX1/2" 30G X 1/2" 0.5 ML MISC USE AS DIRECTED 500 each 2  . cloNIDine (CATAPRES) 0.2 MG tablet TAKE 1 TABLET (0.2 MG TOTAL) BY MOUTH 2 (TWO) TIMES DAILY. 180 tablet 3  . insulin NPH Human (NOVOLIN N) 100 UNIT/ML injection Inject 0.2-0.33 mLs (20-33 Units total) into the skin 2 (two) times daily before a meal. Takes 33 units am and 20 units pm daily. 30 mL 3  . insulin regular (NOVOLIN R) 100 units/mL injection Inject 0.15 mLs (15 Units total) into the skin 3 (three) times daily before meals. 15 mL 2  . metoprolol (LOPRESSOR) 50 MG tablet Take 1 tablet (50 mg total) by mouth 2 (two) times daily. 180 tablet 3  . Multiple Vitamin (MULTIVITAMIN WITH MINERALS) TABS tablet Take 1 tablet by mouth daily.    . polyethylene glycol powder (GLYCOLAX/MIRALAX) powder TAKE 17 G BY MOUTH DAILY. 1581 g 0  . rosuvastatin (CRESTOR) 10 MG tablet TAKE 1 TABLET (10 MG TOTAL) BY MOUTH DAILY. 90 tablet 3  . spironolactone (ALDACTONE) 25 MG tablet Take 1 tablet (25 mg total) by mouth daily. 90 tablet 3  . torsemide (DEMADEX) 20 MG tablet Take 1 tablet (20 mg total) by mouth 2 (two) times daily. 180 tablet 3  .  valsartan (DIOVAN) 320 MG tablet Take 1 tablet (320 mg total) by mouth daily. 90 tablet 3  . verapamil (CALAN-SR) 240 MG CR tablet Take 240 mg by mouth at bedtime.     Current Facility-Administered Medications  Medication Dose Route Frequency Provider Last Rate Last Dose  . betamethasone acetate-betamethasone sodium phosphate (CELESTONE) injection 3 mg  3 mg Intramuscular Once Edrick Kins, DPM      . triamcinolone acetonide  (KENALOG) 10 MG/ML injection 10 mg  10 mg Other Once [name], DPM         Allergies:   Penicillins   Social History:  The patient  reports that she has never smoked. She has never used smokeless tobacco. She reports that she does not drink alcohol or use drugs.   Family History:   family history includes Diabetes in her mother, other, and sister; Heart disease in her sister; Hypertension in her mother; Stroke in her mother.    Review of Systems: Review of Systems  Constitutional: Negative.   Respiratory: Positive for shortness of breath.   Cardiovascular: Positive for leg swelling.  Gastrointestinal: Negative.   Musculoskeletal: Negative.        Gait instability  Neurological: Negative.   Psychiatric/Behavioral: Negative.   All other systems reviewed and are negative.    PHYSICAL EXAM: VS:  BP (!) 146/73 (BP Location: Left Arm, Patient Position: Sitting, Cuff Size: Normal)   Pulse 62   Ht 5\' 3"  (1.6 m)   Wt 276 lb 12 oz (125.5 kg)   BMI 49.02 kg/m  , BMI Body mass index is 49.02 kg/m. GEN: Well nourished, well developed, in no acute distress, obese HEENT: normal  Neck: no JVD, carotid bruits, or masses Cardiac: RRR; no murmurs, rubs, or gallops, trace to 1+ pitting edema bilaterally to the mid shins Respiratory:  clear to auscultation bilaterally, normal work of breathing GI: soft, nontender, nondistended, + BS MS: no deformity or atrophy  Skin: warm and dry, no rash Neuro:  Strength and sensation are intact Psych: euthymic mood, full affect    Recent Labs: 10/13/2015: ALT 22; Hemoglobin 12.8; Platelets 217.0; TSH 1.70 11/15/2015: BUN 19; Creatinine, Ser 1.23; Potassium 4.8; Sodium 136    Lipid Panel Lab Results  Component Value Date   CHOL 165 03/11/2014   HDL 53 03/11/2014   LDLCALC 76 03/11/2014   TRIG 182 (A) 03/11/2014      Wt Readings from Last 3 Encounters:  05/31/16 276 lb 12 oz (125.5 kg)  04/30/16 275 lb 8 oz (125 kg)  01/25/16 276 lb 3.2  oz (125.3 kg)       ASSESSMENT AND PLAN:  Essential hypertension, benign - Plan: EKG 12-Lead Blood pressure is well controlled on today's visit. No changes made to the medications.  Mixed hyperlipidemia Cholesterol is at goal on the current lipid regimen. No changes to the medications were made.  Uncontrolled type 2 diabetes mellitus with stage 3 chronic kidney disease, with long-term current use of insulin ([place]) We have encouraged continued careful diet management in an effort to lose weight.  CKD (chronic kidney disease) stage 3, GFR 30-59 ml/min  Morbid obesity (HCC) Unable to exercise. Obesity hypoventilation   Pedal edema Unclear if leg swelling is exacerbated by verapamil Potentially could decrease the dose of the verapamil, start isosorbide Slowly wean off her verapamil. We'll discuss on her next clinic visit  Chronic diastolic CHF (congestive heart failure) ([place]) Recommended she take extra torsemide as needed for shortness of breath, ankle  swelling   Total encounter time more than 25 minutes  Greater than 50% was spent in counseling and coordination of care with the patient   Disposition:   F/U  6 months   Orders Placed This Encounter  Procedures  . EKG 12-Lead     Signed, Esmond Plants, M.D., Ph.D. 05/31/2016  [place], [place]

## 2016-05-31 NOTE — Patient Instructions (Addendum)
Medication Instructions:   Please increase the torsemide up to two pills a day   Labwork:  No new labs needed  Testing/Procedures:  No further testing at this time   I recommend watching educational videos on topics of interest to you at:       [email or web address]  Enter code: [email or web address]    Follow-Up: It was a pleasure seeing you in the office today. Please call us if you have new issues that need to be addressed before your next appt.  [phone]  Your physician wants you to follow-up in: 6 months.  You will receive a reminder letter in the mail two months in advance. If you don't receive a letter, please call our office to schedule the follow-up appointment.  If you need a refill on your cardiac medications before your next appointment, please call your pharmacy.

## 2016-06-24 ENCOUNTER — Encounter: Payer: Self-pay | Admitting: Family Medicine

## 2016-06-27 ENCOUNTER — Other Ambulatory Visit: Payer: Self-pay | Admitting: Family Medicine

## 2016-06-27 ENCOUNTER — Encounter (INDEPENDENT_AMBULATORY_CARE_PROVIDER_SITE_OTHER): Payer: Medicare Other

## 2016-06-27 ENCOUNTER — Ambulatory Visit (INDEPENDENT_AMBULATORY_CARE_PROVIDER_SITE_OTHER): Payer: Medicare Other | Admitting: Vascular Surgery

## 2016-06-27 DIAGNOSIS — E1165 Type 2 diabetes mellitus with hyperglycemia: Principal | ICD-10-CM

## 2016-06-27 DIAGNOSIS — IMO0002 Reserved for concepts with insufficient information to code with codable children: Secondary | ICD-10-CM

## 2016-06-27 DIAGNOSIS — N183 Chronic kidney disease, stage 3 unspecified: Secondary | ICD-10-CM

## 2016-06-27 DIAGNOSIS — E1122 Type 2 diabetes mellitus with diabetic chronic kidney disease: Secondary | ICD-10-CM

## 2016-06-27 DIAGNOSIS — Z794 Long term (current) use of insulin: Principal | ICD-10-CM

## 2016-06-27 NOTE — Telephone Encounter (Signed)
Sent to pharmacy. Patient was recently hospitalized. Please get her set up for a hospital follow-up. Thanks.

## 2016-06-27 NOTE — Telephone Encounter (Signed)
Last filled 03/07/2016 15 2rf

## 2016-06-27 NOTE — Telephone Encounter (Signed)
Patient is scheduled for 07/05/16

## 2016-07-05 ENCOUNTER — Ambulatory Visit: Payer: Medicare Other | Admitting: Family Medicine

## 2016-07-10 ENCOUNTER — Telehealth: Payer: Self-pay | Admitting: Cardiovascular Disease

## 2016-07-10 NOTE — Telephone Encounter (Signed)
Patient was recently at hospital in [place] for HR of 32.  Patient wants our office to request records and then have Dr. Rockey Situ decide if she needs to fu soon based on these records.  Patient stated they took her off of several medications that Gollan had prescribed for rapid hr and being able to feel in ear.  Patient wants to have Gollan see notes prior to scheduling.   Patient was asked to fu as this is standard when someone call ph.  Patient declined and said she had to go before she got to upset.

## 2016-07-16 ENCOUNTER — Emergency Department: Payer: Medicare Other

## 2016-07-16 ENCOUNTER — Emergency Department
Admission: EM | Admit: 2016-07-16 | Discharge: 2016-07-16 | Disposition: A | Discharge status: Home / Self Care | Payer: Medicare Other | Attending: Emergency Medicine | Admitting: Emergency Medicine

## 2016-07-16 ENCOUNTER — Encounter: Payer: Self-pay | Admitting: Emergency Medicine

## 2016-07-16 DIAGNOSIS — R350 Frequency of micturition: Secondary | ICD-10-CM | POA: Insufficient documentation

## 2016-07-16 DIAGNOSIS — Z79899 Other long term (current) drug therapy: Secondary | ICD-10-CM | POA: Diagnosis not present

## 2016-07-16 DIAGNOSIS — Z7982 Long term (current) use of aspirin: Secondary | ICD-10-CM | POA: Diagnosis not present

## 2016-07-16 DIAGNOSIS — I130 Hypertensive heart and chronic kidney disease with heart failure and stage 1 through stage 4 chronic kidney disease, or unspecified chronic kidney disease: Secondary | ICD-10-CM | POA: Diagnosis not present

## 2016-07-16 DIAGNOSIS — R1030 Lower abdominal pain, unspecified: Secondary | ICD-10-CM | POA: Diagnosis not present

## 2016-07-16 DIAGNOSIS — R300 Dysuria: Secondary | ICD-10-CM | POA: Diagnosis not present

## 2016-07-16 DIAGNOSIS — E1122 Type 2 diabetes mellitus with diabetic chronic kidney disease: Secondary | ICD-10-CM | POA: Diagnosis not present

## 2016-07-16 DIAGNOSIS — Z794 Long term (current) use of insulin: Secondary | ICD-10-CM | POA: Insufficient documentation

## 2016-07-16 DIAGNOSIS — R319 Hematuria, unspecified: Secondary | ICD-10-CM | POA: Insufficient documentation

## 2016-07-16 DIAGNOSIS — R600 Localized edema: Secondary | ICD-10-CM | POA: Insufficient documentation

## 2016-07-16 DIAGNOSIS — N183 Chronic kidney disease, stage 3 (moderate): Secondary | ICD-10-CM | POA: Diagnosis not present

## 2016-07-16 DIAGNOSIS — I5032 Chronic diastolic (congestive) heart failure: Secondary | ICD-10-CM | POA: Insufficient documentation

## 2016-07-16 LAB — URINALYSIS, COMPLETE (UACMP) WITH MICROSCOPIC
Bilirubin Urine: NEGATIVE
Ketones, ur: NEGATIVE mg/dL
LEUKOCYTES UA: NEGATIVE
Nitrite: NEGATIVE
PH: 7 (ref 5.0–8.0)
Protein, ur: NEGATIVE mg/dL
Specific Gravity, Urine: 1.006 (ref 1.005–1.030)

## 2016-07-16 MED ORDER — SULFAMETHOXAZOLE-TRIMETHOPRIM 800-160 MG PO TABS: 1.0000 | ORAL_TABLET | Freq: Two times a day (BID) | ORAL | 0 refills | Status: DC

## 2016-07-16 NOTE — ED Triage Notes (Signed)
States soreness over bladder, pressure pain with urination and noted blood in urine this am.

## 2016-07-16 NOTE — ED Triage Notes (Signed)
Pt reports hematuria since yesterday, reports lower abdominal pain.

## 2016-07-16 NOTE — ED Notes (Signed)
Back from ct   Family at bedside

## 2016-07-16 NOTE — ED Provider Notes (Signed)
[facility] Emergency Department Provider Note  ____________________________________________  Time seen: Approximately 10:20 AM  I have reviewed the triage vital signs and the nursing notes.   HISTORY  Chief Complaint Hematuria    HPI Alexis Farmer is a 76 y.o. female with a history of chronic kidney disease, diabetes, and hypertension presents to the emergency department with hematuria, increased urinary frequency and suprapubic pain that started on July 15, 2016. Patient has a history of urinary tract infections. Last urinary tract infection was "sometime last year". Patient denies a history of nephrolithiasis and pyelonephritis. She denies flank pain. Patient denies increased leg swelling. Patient states that she has chronic left leg edema. Patient utilizes three pillows at night to sleep, which has not changed recently. Patient denies chest pain, shortness of breath, cough, nausea and vomiting. Patient has had brown vaginal discharge. She has been afebrile. Patient is retired. She enjoys watching soap operas in her spare time. Her favorite is the Young [place] the Restless. Last Pap smear was one year ago.  Past Medical History:  Diagnosis Date  . Arthritis   . Chickenpox   . Diabetes mellitus without complication ([place])   . Diverticulitis   . GI bleed   . High cholesterol   . History of blood transfusion   . Hypertension   . Renal insufficiency     Patient Active Problem List   Diagnosis Date Noted  . Chronic diastolic CHF (congestive heart failure) ([place]) 05/31/2016  . Cough 01/25/2016  . Chronic fatigue 11/15/2015  . Depression 10/13/2015  . Osteoarthritis 10/13/2015  . Vertigo 10/13/2015  . Anxiety 09/13/2015  . Chronic kidney disease 09/13/2015  . BP (high blood pressure) 09/13/2015  . GI bleed 08/03/2015  . Hyperlipidemia 08/26/2014  . Essential hypertension, benign 04/08/2014  . Diabetes mellitus type II, uncontrolled ([place]) 04/08/2014  .  CKD (chronic kidney disease) stage 3, GFR 30-59 ml/min 04/08/2014  . Pedal edema 04/08/2014  . Morbid obesity ([place]) 04/08/2014  . Lower back pain 04/08/2014  . Diabetic polyneuropathy ([place]) 04/08/2014    Past Surgical History:  Procedure Laterality Date  . APPENDECTOMY    . ECTOPIC PREGNANCY SURGERY    . gallbladder     . THYROIDECTOMY, PARTIAL      Prior to Admission medications   Medication Sig Start Date End Date Taking? Authorizing Provider  albuterol (PROVENTIL HFA;VENTOLIN HFA) 108 (90 Base) MCG/ACT inhaler Inhale 2 puffs into the lungs every 6 (six) hours as needed for wheezing or shortness of breath. 04/30/16   Leone Haven, MD  ALPRAZolam Duanne [place]on) 0.5 MG tablet TAKE 1 TABLET BY MOUTH NIGHTLY AS NEEDED FOR SLEEP 04/04/16   Leone Haven, MD  aspirin 81 MG tablet Take 81 mg by mouth daily.    Historical Provider, MD  B-D INS SYRINGE 0.5CC/30GX1/2" 30G X 1/2" 0.5 ML MISC USE AS DIRECTED 11/16/15   Leone Haven, MD  cloNIDine (CATAPRES) 0.2 MG tablet TAKE 1 TABLET (0.2 MG TOTAL) BY MOUTH 2 (TWO) TIMES DAILY. 02/03/16   Minna Merritts, MD  insulin NPH Human (NOVOLIN N) 100 UNIT/ML injection Inject 0.2-0.33 mLs (20-33 Units total) into the skin 2 (two) times daily before a meal. Takes 33 units am and 20 units pm daily. 03/07/16   Leone Haven, MD  metoprolol (LOPRESSOR) 50 MG tablet Take 1 tablet (50 mg total) by mouth 2 (two) times daily. 11/15/15   Leone Haven, MD  Multiple Vitamin (MULTIVITAMIN WITH MINERALS) TABS tablet Take 1  tablet by mouth daily.    Historical Provider, MD  NOVOLIN R 100 UNIT/ML injection INJECT 0.15 MLS (15 UNITS TOTAL) INTO THE SKIN 3 (THREE) TIMES DAILY BEFORE MEALS. 06/27/16   Leone Haven, MD  polyethylene glycol powder (GLYCOLAX/MIRALAX) powder TAKE 17 G BY MOUTH DAILY. 03/12/16   Leone Haven, MD  rosuvastatin (CRESTOR) 10 MG tablet TAKE 1 TABLET (10 MG TOTAL) BY MOUTH DAILY. 11/15/15   Leone Haven, MD  spironolactone  (ALDACTONE) 25 MG tablet Take 1 tablet (25 mg total) by mouth daily. 11/15/15   Leone Haven, MD  torsemide (DEMADEX) 20 MG tablet Take 1 tablet (20 mg total) by mouth 2 (two) times daily. 05/31/16   Minna Merritts, MD  valsartan (DIOVAN) 320 MG tablet Take 1 tablet (320 mg total) by mouth daily. 11/15/15   Leone Haven, MD  verapamil (CALAN-SR) 240 MG CR tablet Take 240 mg by mouth at bedtime.    Historical Provider, MD    Allergies Penicillins  Family History  Problem Relation Age of Onset  . Diabetes Mother   . Hypertension Mother   . Stroke Mother   . Diabetes Other   . Diabetes Sister   . Heart disease Sister     Social History Social History  Substance Use Topics  . Smoking status: Never Smoker  . Smokeless tobacco: Never Used  . Alcohol use No     Review of Systems  Constitutional: No fever/chills Eyes: No visual changes. No discharge ENT: No upper respiratory complaints. Cardiovascular: no chest pain, no chest tightness.  Respiratory: no cough. No SOB. Gastrointestinal: Patient has suprapubic pain.  No nausea, no vomiting.  No diarrhea.  No constipation. Genitourinary: Patient has dysuria, hematuria and increased urinary frequency.  Musculoskeletal: Negative for musculoskeletal pain. Skin: Negative for rash, abrasions, lacerations, ecchymosis. Neurological: Negative for headaches, focal weakness or numbness. ____________________________________________   PHYSICAL EXAM:  VITAL SIGNS: ED Triage Vitals  Enc Vitals Group     BP 07/16/16 0853 (!) 184/76     Pulse Rate 07/16/16 0853 84     Resp 07/16/16 0853 20     Temp 07/16/16 0853 98.1 F (36.7 C)     Temp Source 07/16/16 0853 Oral     SpO2 07/16/16 0853 97 %     Weight 07/16/16 0854 275 lb (124.7 kg)     Height 07/16/16 0854 5\' 3"  (1.6 m)     Head Circumference --      Peak Flow --      Pain Score 07/16/16 0800 6     Pain Loc --      Pain Edu? --      Excl. in [place]? --       Constitutional: Alert and oriented. Patient is talkative and engaged.  Eyes: Palpebral and bulbar conjunctiva are nonerythematous bilaterally. PERRL. EOMI. No scleral icterus bilaterally. Head: Atraumatic. ENT:      Nose: Skin overlying nares is without erythema. Nasal turbinates are non-erythematous. Nasal septum is midline.      Mouth/Throat: Mucous membranes are moist. Posterior pharynx is nonerythematous. Uvula is midline. Neck: Full range of motion. Cardiovascular. No pain with palpation over the anterior and posterior chest wall. Normal rate, regular rhythm. Normal S1 and S2. No murmurs, gallops or rubs auscultated.  Respiratory: Trachea is midline. No retractions or presence of deformity. Thoracic expansion is symmetric with unaccentuated tactile fremitus. Resonant and symmetric percussion tones bilaterally. On auscultation, adventitious sounds are absent.  Gastrointestinal:Abdomen is symmetric.  No areas of visible pulsations or peristalsis. Active bowel sounds audible in all four quadrants. No friction rubs over liver or spleen auscultated. Percussion tones tympanic over epigastrium and resonant over remainder of abdomen. On inspiration, liver edge is firm, smooth and non-tender. No splenomegaly. Musculature soft and relaxed to light palpation. No masses or areas of tenderness to deep palpation. No costovertebral angle tenderness bilaterally.  Neurologic:  Normal for age. No gross focal neurologic deficits are appreciated.  Skin:  Skin is warm, dry and intact. No rash noted. Patient has 1+ pitting edema at the left ankle. Psychiatric: Mood and affect are normal for age. Speech and behavior are normal.  ____________________________________________   LABS (all labs ordered are listed, but only abnormal results are displayed)  Labs Reviewed  URINALYSIS, COMPLETE (UACMP) WITH MICROSCOPIC - Abnormal; Notable for the following:       Result Value   Color, Urine STRAW (*)    APPearance  CLEAR (*)    Glucose, UA >=500 (*)    Hgb urine dipstick MODERATE (*)    Bacteria, UA RARE (*)    Squamous Epithelial / LPF 0-5 (*)    All other components within normal limits  URINE CULTURE   ____________________________________________  EKG   ____________________________________________  RADIOLOGY Unk Pinto, personally viewed and evaluated these images (plain radiographs) as part of my medical decision making, as well as reviewing the written report by the radiologist.  Ct Renal Stone Study  Result Date: 07/16/2016 CLINICAL DATA:  Pelvic pain and hematuria. EXAM: CT ABDOMEN AND PELVIS WITHOUT CONTRAST TECHNIQUE: Multidetector CT imaging of the abdomen and pelvis was performed following the standard protocol without IV contrast. COMPARISON:  CT scan 08/03/2015 FINDINGS: Lower chest: Patchy subpleural atelectasis noted at the right lung base. No worrisome pulmonary lesions or pleural effusion. The heart is normal in size. No pericardial effusion. Coronary artery calcifications are noted along with aortic calcifications. The distal esophagus is grossly normal. Few small calcifications noted in the wall of the esophagus are stable and may be related to prior inflammation. Hepatobiliary: No focal hepatic lesions or intrahepatic biliary dilatation. The gallbladder surgically absent. No common bile duct dilatation. Pancreas: No mass, inflammation or ductal dilatation. Mild fatty change in the pancreatic head. Mild pancreatic atrophy. Spleen: Normal size.  No focal lesions. Adrenals/Urinary Tract: The adrenal glands are normal. No renal, ureteral or bladder calculi or mass. Stomach/Bowel: The stomach, duodenum, small bowel and colon are grossly normal without oral contrast. No inflammatory changes, mass lesions or obstructive findings. A moderate-sized duodenum diverticulum is noted. The terminal ileum is normal. The appendix is normal. Vascular/Lymphatic: Advanced atherosclerotic  calcifications involving the aorta and branch vessels without definite aneurysm. Small scattered mesenteric and retroperitoneal lymph nodes but no mass or overt adenopathy. Reproductive: Abnormal endometrium for age. It measures a maximum of 16 mm. Recommend endometrial sampling to exclude endometrial cancer. The myometrium appears normal. Both ovaries appear normal. Other: There are few small scattered pelvic lymph nodes but these appears stable. No inguinal mass or adenopathy. No abdominal wall hernia or subcutaneous lesions. Musculoskeletal: Advanced degenerative changes involving the lower lumbar spine. Suspect broad-based disc protrusion at L4-5 with significant spinal and lateral recess stenosis. Recommend correlation with clinical findings. IMPRESSION: 1. No acute abdominal/pelvic findings, mass lesions or lymphadenopathy. 2. No renal, ureteral or bladder calculi or mass. 3. Abnormal CT appearance of the endometrium for the patient's age. Recommend correlation with pelvic ultrasound examination and patient may require endometrial sampling. I would wonder  if the hematuria could actually be due to uterine bleeding. 4. Advanced atherosclerotic calcifications involving the aorta and branch vessels. 5. 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 These results will be called to the ordering clinician or representative by the Radiologist Assistant, and communication documented in the PACS or zVision Dashboard. Electronically Signed   By: Marijo Sanes M.D.   On: 07/16/2016 10:52    ____________________________________________    PROCEDURES  Procedure(s) performed:    Procedures    Medications - No data to display   ____________________________________________   INITIAL IMPRESSION / ASSESSMENT AND PLAN / ED COURSE  Pertinent labs & imaging results that were available during my care of the patient were reviewed by me and considered in my medical decision making (see chart for  details).  Review of the  CSRS was performed in accordance of the [place] prior to dispensing any controlled drugs.    Assessment and Plan:  Hematuria:  Patient presents to the emergency department with dysuria, increased urinary frequency and hematuria for one day. Urinalysis in the emergency department indicates moderate blood. Urinalysis was noncontributory for acute cystitis despite symptoms of increased urinary frequency and dysuria. Urinalysis was sent for culture. Patient underwent CT renal stone study to assess for possible nephrolithiasis. CT renal stone study indicated no acute abdominal/pelvic findings, or mass lesions. However, the endometrium appeared abnormal for patient's age. Patient underwent a pelvic ultrasound in the emergency department today. The ultrasound revealed abnormal thickening of the endometrium up to 14 mm with no other abnormalities. I spoke with patient's primary care office staff regarding scheduling appointment for endometrial biopsy. Patient will undergo endometrial biopsy at 1:30 pm today. Patient was discharged with Bactrim for empiric treatment of urinary tract infection. All patient questions were answered.  ____________________________________________  FINAL CLINICAL IMPRESSION(S) / ED DIAGNOSES  Final diagnoses:  Hematuria      NEW MEDICATIONS STARTED DURING THIS VISIT:  New Prescriptions   No medications on file        This chart was dictated using voice recognition software/Dragon. Despite best efforts to proofread, errors can occur which can change the meaning. Any change was purely unintentional.    Lannie Fields, PA-C 07/16/16 1621    Harvest Dark, MD 07/19/16 1459

## 2016-07-17 ENCOUNTER — Telehealth: Payer: Self-pay | Admitting: Radiology

## 2016-07-17 ENCOUNTER — Ambulatory Visit (INDEPENDENT_AMBULATORY_CARE_PROVIDER_SITE_OTHER): Payer: Medicare Other | Admitting: Family Medicine

## 2016-07-17 ENCOUNTER — Encounter: Payer: Self-pay | Admitting: Family Medicine

## 2016-07-17 VITALS — BP 160/84 | HR 81 | Temp 98.8°F | Wt 279.2 lb

## 2016-07-17 DIAGNOSIS — E1165 Type 2 diabetes mellitus with hyperglycemia: Secondary | ICD-10-CM | POA: Diagnosis not present

## 2016-07-17 DIAGNOSIS — R001 Bradycardia, unspecified: Secondary | ICD-10-CM

## 2016-07-17 DIAGNOSIS — R05 Cough: Secondary | ICD-10-CM | POA: Diagnosis not present

## 2016-07-17 DIAGNOSIS — IMO0002 Reserved for concepts with insufficient information to code with codable children: Secondary | ICD-10-CM

## 2016-07-17 DIAGNOSIS — N183 Chronic kidney disease, stage 3 unspecified: Secondary | ICD-10-CM

## 2016-07-17 DIAGNOSIS — R059 Cough, unspecified: Secondary | ICD-10-CM

## 2016-07-17 DIAGNOSIS — E1122 Type 2 diabetes mellitus with diabetic chronic kidney disease: Secondary | ICD-10-CM | POA: Diagnosis not present

## 2016-07-17 DIAGNOSIS — R053 Chronic cough: Secondary | ICD-10-CM

## 2016-07-17 DIAGNOSIS — N950 Postmenopausal bleeding: Secondary | ICD-10-CM

## 2016-07-17 DIAGNOSIS — Z794 Long term (current) use of insulin: Secondary | ICD-10-CM

## 2016-07-17 DIAGNOSIS — I10 Essential (primary) hypertension: Secondary | ICD-10-CM | POA: Diagnosis not present

## 2016-07-17 HISTORY — DX: Postmenopausal bleeding: N95.0

## 2016-07-17 HISTORY — DX: Bradycardia, unspecified: R00.1

## 2016-07-17 MED ORDER — ALBUTEROL SULFATE HFA 108 (90 BASE) MCG/ACT IN AERS: 2.0000 | INHALATION_SPRAY | Freq: Four times a day (QID) | RESPIRATORY_TRACT | 0 refills | Status: DC | PRN

## 2016-07-17 NOTE — Assessment & Plan Note (Signed)
Recheck kidney function today.  

## 2016-07-17 NOTE — Progress Notes (Signed)
Tommi Rumps, MD Phone: [phone]  Alexis Farmer is a 76 y.o. female who presents today for hospital follow-up.  Patient was hospitalized in [place], [place] following an episode while she was at church where she felt she was going to pass out. She had developed upper abdominal discomfort and felt lightheaded and sick. EMS was called and her heart rate was 32. She felt cold all over. She was akin to the hospital and had an evaluation there including echo and cardiac evaluation with cardiology. They discontinued her metoprolol and verapamil. She notes she's not had any issues with her heart rate since then. She notes no recurrent epigastric discomfort. No chest pain or shortness of breath. No blood in her stool. No abdominal pain. She feels better with regards to this.   She also went to the emergency room yesterday due to possible hematuria. She had a CT scan abdomen and pelvis looking for renal stone though this found thickened endometrium. She saw gynecology yesterday and they did an endometrial biopsy. She notes no bleeding at this time. She notes no dysuria. Some urgency and frequency though she reports these are chronic and unchanged. Her urinalysis did not have any nitrites or leukocytes on it.   She additionally notes some chronic nighttime cough. Also has some occasional wheezing. She notes no reflux symptoms. Cough is nonproductive. She does have albuterol though does not use it very frequently. Has never been evaluated for COPD or chronic bronchitis issues. Since her first hospitalization in [place] she just feels as though she has no energy. No get up and go.  She reports her blood pressure is been more normal when his been checked at home or outside of the office. She reports yesterday was elevated and they rechecked it goes in the 120s over 60s. She would prefer not to start on any medicines for blood pressure at this time.  PMH: nonsmoker.   ROS see history of present  illness  Objective  Physical Exam Vitals:   07/17/16 1128  BP: (!) 160/84  Pulse: 81  Temp: 98.8 F (37.1 C)    BP Readings from Last 3 Encounters:  07/17/16 (!) 160/84  07/16/16 (!) 184/76  05/31/16 (!) 146/73   Wt Readings from Last 3 Encounters:  07/17/16 279 lb 3.2 oz (126.6 kg)  07/16/16 275 lb (124.7 kg)  05/31/16 276 lb 12 oz (125.5 kg)    Physical Exam  Constitutional: No distress.  Cardiovascular: Normal rate, regular rhythm and normal heart sounds.   Pulmonary/Chest: Effort normal and breath sounds normal.  Abdominal: Soft. Bowel sounds are normal. She exhibits no distension. There is no tenderness. There is no rebound and no guarding.  Musculoskeletal: She exhibits no edema.  Neurological: She is alert.  Skin: Skin is warm and dry. She is not diaphoretic.    Assessment/Plan: Please see individual problem list.  Essential hypertension, benign Elevated today. Discussed adding another medicine though she is hesitant to do this. We opted to have her come back in a week for recheck as she was unsure if she could check it at home consistently. We will make a determination on plan once she returns for recheck.  Bradycardia Found to be bradycardic at outside hospital. They stopped metoprolol and verapamil and heart rate has been normal since then. Echo appears to have been reassuring at the outside facility. Appears to be doing much better with regards to this. She'll continue to monitor for recurrent symptoms. She'll continue to follow with cardiology.  CKD (  chronic kidney disease) stage 3, GFR 30-59 ml/min Recheck kidney function today.  Diabetes mellitus type II, uncontrolled Check A1c today. She'll continue her current diabetes regimen until the A1c returns.  Postmenopausal bleeding Suspect the blood that she saw yesterday that was felt to be hematuria was related to postmenopausal bleeding particularly given thickened endometrium and CT scan. She has already  seen gynecology. There were no findings of infection on her urinalysis and her urinary complaints have been chronic per her report today. Discussed holding off on antibiotics until her urine culture returns. She'll await the endometrial biopsy results and gynecology will determine the plan from there.  Cough Has been a chronic issue. Chest x-rays that showed chronic bronchitic changes. She'll continue albuterol as needed. We will obtain PFTs.   Orders Placed This Encounter  Procedures  . Comp Met (CMET)  . HgB A1c  . Pulmonary function test    Please have a pulmonologist interpret the results. Thanks.    Standing Status:   Future    Standing Expiration Date:   07/17/2017    Order Specific Question:   Where should this test be performed?    Answer:   [facility]    Order Specific Question:   Full PFT: includes the following: basic spirometry, spirometry pre & post bronchodilator, diffusion capacity (DLCO), lung volumes    Answer:   Full PFT    Meds ordered this encounter  Medications  . albuterol (PROVENTIL HFA;VENTOLIN HFA) 108 (90 Base) MCG/ACT inhaler    Sig: Inhale 2 puffs into the lungs every 6 (six) hours as needed for wheezing or shortness of breath.    Dispense:  1 Inhaler    Refill:  0   Tommi Rumps, MD [place]

## 2016-07-17 NOTE — Assessment & Plan Note (Signed)
Suspect the blood that she saw yesterday that was felt to be hematuria was related to postmenopausal bleeding particularly given thickened endometrium and CT scan. She has already seen gynecology. There were no findings of infection on her urinalysis and her urinary complaints have been chronic per her report today. Discussed holding off on antibiotics until her urine culture returns. She'll await the endometrial biopsy results and gynecology will determine the plan from there.

## 2016-07-17 NOTE — Assessment & Plan Note (Addendum)
Check A1c today. She'll continue her current diabetes regimen until the A1c returns.

## 2016-07-17 NOTE — Assessment & Plan Note (Signed)
Found to be bradycardic at outside hospital. They stopped metoprolol and verapamil and heart rate has been normal since then. Echo appears to have been reassuring at the outside facility. Appears to be doing much better with regards to this. She'll continue to monitor for recurrent symptoms. She'll continue to follow with cardiology.

## 2016-07-17 NOTE — Assessment & Plan Note (Signed)
Has been a chronic issue. Chest x-rays that showed chronic bronchitic changes. She'll continue albuterol as needed. We will obtain PFTs.

## 2016-07-17 NOTE — Assessment & Plan Note (Addendum)
Elevated today. Discussed adding another medicine though she is hesitant to do this. We opted to have her come back in a week for recheck as she was unsure if she could check it at home consistently. We will make a determination on plan once she returns for recheck.

## 2016-07-17 NOTE — Telephone Encounter (Signed)
Pt wants to come back for labs. Pt was stuck three times and stated she did not want [place] to try anymore. Lab orders reordered and placed as future.

## 2016-07-17 NOTE — Progress Notes (Signed)
Pre visit review using our clinic review tool, if applicable. No additional management support is needed unless otherwise documented below in the visit note. 

## 2016-07-17 NOTE — Patient Instructions (Addendum)
Nice to see you. I am glad you are feeling somewhat better. We will check some lab work and contact you with the results. We'll get you set up for lung function testing. Please monitor your blood pressure at home if you are able to. If it is consistently greater than 140/90 please let us know. We'll have you return in 1 week for blood pressure check.

## 2016-07-18 ENCOUNTER — Other Ambulatory Visit (INDEPENDENT_AMBULATORY_CARE_PROVIDER_SITE_OTHER): Payer: Medicare Other

## 2016-07-18 DIAGNOSIS — N183 Chronic kidney disease, stage 3 unspecified: Secondary | ICD-10-CM

## 2016-07-18 DIAGNOSIS — IMO0002 Reserved for concepts with insufficient information to code with codable children: Secondary | ICD-10-CM

## 2016-07-18 DIAGNOSIS — E1165 Type 2 diabetes mellitus with hyperglycemia: Secondary | ICD-10-CM | POA: Diagnosis not present

## 2016-07-18 DIAGNOSIS — Z794 Long term (current) use of insulin: Secondary | ICD-10-CM

## 2016-07-18 DIAGNOSIS — E1122 Type 2 diabetes mellitus with diabetic chronic kidney disease: Secondary | ICD-10-CM | POA: Diagnosis not present

## 2016-07-18 LAB — COMPREHENSIVE METABOLIC PANEL
ALT: 31 U/L (ref 0–35)
AST: 56 U/L — AB (ref 0–37)
Albumin: 4.3 g/dL (ref 3.5–5.2)
Alkaline Phosphatase: 45 U/L (ref 39–117)
BUN: 15 mg/dL (ref 6–23)
CO2: 31 meq/L (ref 19–32)
CREATININE: 1.23 mg/dL — AB (ref 0.40–1.20)
Calcium: 9.8 mg/dL (ref 8.4–10.5)
Chloride: 102 mEq/L (ref 96–112)
GFR: 54.38 mL/min — ABNORMAL LOW (ref >60.00)
Glucose, Bld: 282 mg/dL — ABNORMAL HIGH (ref 70–99)
POTASSIUM: 4.2 meq/L (ref 3.5–5.1)
SODIUM: 138 meq/L (ref 135–145)
Total Bilirubin: 0.7 mg/dL (ref 0.2–1.2)
Total Protein: 7.6 g/dL (ref 6.0–8.3)

## 2016-07-18 LAB — URINE CULTURE

## 2016-07-18 LAB — HEMOGLOBIN A1C: Hgb A1c MFr Bld: 9.2 % — ABNORMAL HIGH (ref 4.6–6.5)

## 2016-07-19 ENCOUNTER — Ambulatory Visit (INDEPENDENT_AMBULATORY_CARE_PROVIDER_SITE_OTHER): Payer: Medicare Other | Admitting: Vascular Surgery

## 2016-07-19 ENCOUNTER — Other Ambulatory Visit (INDEPENDENT_AMBULATORY_CARE_PROVIDER_SITE_OTHER): Payer: Self-pay | Admitting: Vascular Surgery

## 2016-07-19 ENCOUNTER — Encounter (INDEPENDENT_AMBULATORY_CARE_PROVIDER_SITE_OTHER): Payer: Self-pay | Admitting: Vascular Surgery

## 2016-07-19 ENCOUNTER — Ambulatory Visit (INDEPENDENT_AMBULATORY_CARE_PROVIDER_SITE_OTHER): Payer: Medicare Other

## 2016-07-19 VITALS — BP 190/93 | HR 78 | Resp 20 | Ht 63.0 in | Wt 278.0 lb

## 2016-07-19 DIAGNOSIS — I6523 Occlusion and stenosis of bilateral carotid arteries: Secondary | ICD-10-CM

## 2016-07-19 DIAGNOSIS — M8949 Other hypertrophic osteoarthropathy, multiple sites: Secondary | ICD-10-CM

## 2016-07-19 DIAGNOSIS — I872 Venous insufficiency (chronic) (peripheral): Secondary | ICD-10-CM

## 2016-07-19 DIAGNOSIS — M150 Primary generalized (osteo)arthritis: Secondary | ICD-10-CM

## 2016-07-19 DIAGNOSIS — M7989 Other specified soft tissue disorders: Secondary | ICD-10-CM | POA: Diagnosis not present

## 2016-07-19 DIAGNOSIS — I6521 Occlusion and stenosis of right carotid artery: Secondary | ICD-10-CM | POA: Diagnosis not present

## 2016-07-19 DIAGNOSIS — M159 Polyosteoarthritis, unspecified: Secondary | ICD-10-CM

## 2016-07-19 DIAGNOSIS — I10 Essential (primary) hypertension: Secondary | ICD-10-CM | POA: Diagnosis not present

## 2016-07-19 DIAGNOSIS — M79669 Pain in unspecified lower leg: Secondary | ICD-10-CM | POA: Diagnosis not present

## 2016-07-19 DIAGNOSIS — I739 Peripheral vascular disease, unspecified: Secondary | ICD-10-CM | POA: Insufficient documentation

## 2016-07-19 DIAGNOSIS — I6529 Occlusion and stenosis of unspecified carotid artery: Secondary | ICD-10-CM | POA: Insufficient documentation

## 2016-07-19 HISTORY — DX: Other specified soft tissue disorders: M79.89

## 2016-07-19 LAB — VAS US CAROTID
LCCADSYS: -64 cm/s
LICADDIAS: -14 cm/s
Left CCA dist dias: -8 cm/s
Left CCA prox dias: 11 cm/s
Left CCA prox sys: 72 cm/s
Left ICA dist sys: -55 cm/s
Left ICA prox dias: -16 cm/s
Left ICA prox sys: -62 cm/s
RCCADSYS: -76 cm/s
RCCAPDIAS: 10 cm/s
RIGHT CCA MID DIAS: 9 cm/s
RIGHT ECA DIAS: 6 cm/s
Right CCA prox sys: 58 cm/s

## 2016-07-19 NOTE — Progress Notes (Signed)
MRN : [id number]  Alexis Farmer is a 76 y.o. (Oct 08, 1940) female who presents with chief complaint of  Chief Complaint  Patient presents with  . Re-evaluation    6 month carotid  .  History of Present Illness: The patient is seen for follow up evaluation of carotid stenosis. The carotid stenosis followed by ultrasound.   The patient denies amaurosis fugax. There is no recent history of TIA symptoms or focal motor deficits. There is no prior documented CVA.  The patient is taking enteric-coated aspirin 81 mg daily.  There is no history of migraine headaches. There is no history of seizures.  The patient has a history of coronary artery disease, no recent episodes of angina or shortness of breath. The patient denies PAD or claudication symptoms. There is a history of hyperlipidemia which is being treated with a statin.    Carotid Duplex done today shows 40-59% RICA.    Current Meds  Medication Sig  . albuterol (PROVENTIL HFA;VENTOLIN HFA) 108 (90 Base) MCG/ACT inhaler Inhale 2 puffs into the lungs every 6 (six) hours as needed for wheezing or shortness of breath.  Marland Kitchen aspirin 81 MG tablet Take 81 mg by mouth daily.  . B-D INS SYRINGE 0.5CC/30GX1/2" 30G X 1/2" 0.5 ML MISC USE AS DIRECTED  . cloNIDine (CATAPRES) 0.2 MG tablet TAKE 1 TABLET (0.2 MG TOTAL) BY MOUTH 2 (TWO) TIMES DAILY.  Marland Kitchen insulin NPH Human (NOVOLIN N) 100 UNIT/ML injection Inject 0.2-0.33 mLs (20-33 Units total) into the skin 2 (two) times daily before a meal. Takes 33 units am and 20 units pm daily.  . metolazone (ZAROXOLYN) 5 MG tablet TAKE 1 TABLET BY MOUTH TWICE A WEEK AS NEEDED  . metoprolol (LOPRESSOR) 50 MG tablet   . Multiple Vitamin (MULTIVITAMIN WITH MINERALS) TABS tablet Take 1 tablet by mouth daily.  . nitrofurantoin, macrocrystal-monohydrate, (MACROBID) 100 MG capsule Take by mouth.  Marland Kitchen NOVOLIN R 100 UNIT/ML injection INJECT 0.15 MLS (15 UNITS TOTAL) INTO THE SKIN 3 (THREE) TIMES DAILY BEFORE MEALS.  Marland Kitchen  polyethylene glycol powder (GLYCOLAX/MIRALAX) powder TAKE 17 G BY MOUTH DAILY.  Marland Kitchen predniSONE (DELTASONE) 20 MG tablet   . rosuvastatin (CRESTOR) 10 MG tablet TAKE 1 TABLET (10 MG TOTAL) BY MOUTH DAILY.  Marland Kitchen spironolactone (ALDACTONE) 25 MG tablet Take 1 tablet (25 mg total) by mouth daily.  Marland Kitchen torsemide (DEMADEX) 20 MG tablet Take 1 tablet (20 mg total) by mouth 2 (two) times daily.  . valsartan (DIOVAN) 320 MG tablet Take 1 tablet (320 mg total) by mouth daily.  . verapamil (CALAN-SR) 240 MG CR tablet TAKE 1 TABLET (240 MG TOTAL) BY MOUTH AT BEDTIME.   Current Facility-Administered Medications for the 07/19/16 encounter (Office Visit) with Katha Cabal, MD  Medication  . betamethasone acetate-betamethasone sodium phosphate (CELESTONE) injection 3 mg  . triamcinolone acetonide (KENALOG) 10 MG/ML injection 10 mg    Past Medical History:  Diagnosis Date  . Arthritis   . Chickenpox   . Diabetes mellitus without complication ([place])   . Diverticulitis   . GI bleed   . High cholesterol   . History of blood transfusion   . Hypertension   . Renal insufficiency     Past Surgical History:  Procedure Laterality Date  . APPENDECTOMY    . ECTOPIC PREGNANCY SURGERY    . gallbladder     . THYROIDECTOMY, PARTIAL      Social History Social History  Substance Use Topics  . Smoking status: Never Smoker  .  Smokeless tobacco: Never Used  . Alcohol use No    Family History Family History  Problem Relation Age of Onset  . Diabetes Mother   . Hypertension Mother   . Stroke Mother   . Diabetes Other   . Diabetes Sister   . Heart disease Sister   No family history of bleeding/clotting disorders, porphyria or autoimmune disease   Allergies  Allergen Reactions  . Penicillins Hives, Swelling and Other (See Comments)    Has patient had a PCN reaction causing immediate rash, facial/tongue/throat swelling, SOB or lightheadedness with hypotension: Yes Has patient had a PCN reaction causing  severe rash involving mucus membranes or skin necrosis: No Has patient had a PCN reaction that required hospitalization No Has patient had a PCN reaction occurring within the last 10 years: Yes If all of the above answers are "NO", then may proceed with Cephalosporin use.      REVIEW OF SYSTEMS (Negative unless checked)  Constitutional: [] Weight loss  [] Fever  [] Chills Cardiac: [] Chest pain   [] Chest pressure   [] Palpitations   [] Shortness of breath when laying flat   [] Shortness of breath with exertion. Vascular:  [] Pain in legs with walking   [x] Pain in legs at rest  [] History of DVT   [] Phlebitis   [x] Swelling in legs   [] Varicose veins   [] Non-healing ulcers Pulmonary:   [] Uses home oxygen   [] Productive cough   [] Hemoptysis   [] Wheeze  [] COPD   [] Asthma Neurologic:  [] Dizziness   [] Seizures   [] History of stroke   [] History of TIA  [] Aphasia   [] Vissual changes   [] Weakness or numbness in arm   [] Weakness or numbness in leg Musculoskeletal:   [] Joint swelling   [x] Joint pain   [] Low back pain Hematologic:  [] Easy bruising  [] Easy bleeding   [] Hypercoagulable state   [] Anemic Gastrointestinal:  [] Diarrhea   [] Vomiting  [] Gastroesophageal reflux/heartburn   [] Difficulty swallowing. Genitourinary:  [] Chronic kidney disease   [] Difficult urination  [] Frequent urination   [] Blood in urine Skin:  [] Rashes   [] Ulcers  Psychological:  [] History of anxiety   []  History of major depression.  Physical Examination  Vitals:   07/19/16 0831 07/19/16 0834  BP: 137/67 (!) 190/93  Pulse: 78   Resp: 20   Weight: 278 lb (126.1 kg)   Height: 5\' 3"  (1.6 m)    Body mass index is 49.25 kg/m. Gen: WD/WN, NAD obese Head: [place]/AT, No temporalis wasting.  Ear/Nose/Throat: Hearing grossly intact, nares w/o erythema or drainage, poor dentition Eyes: PER, EOMI, sclera nonicteric.  Neck: Supple, no masses.  No bruit or JVD.  Pulmonary:  Good air movement, clear to auscultation bilaterally, no use of  accessory muscles.  Cardiac: RRR, normal S1, S2, no Murmurs. Vascular:  Right carotid bruit; 3+ edema with moderate venous stasis changes Vessel Right Left  Radial Palpable Palpable  Ulnar Palpable Palpable  Brachial Palpable Palpable  Carotid Palpable Palpable  Femoral Palpable Palpable  Popliteal Palpable Palpable  PT Palpable Palpable  DP Palpable Palpable   Gastrointestinal: soft, non-distended. No guarding/no peritoneal signs.  Musculoskeletal: M/S 5/5 throughout.  No deformity or atrophy.  Neurologic: CN 2-12 intact. Pain and light touch intact in extremities.  Symmetrical.  Speech is fluent. Motor exam as listed above. Psychiatric: Judgment intact, Mood & affect appropriate for pt's clinical situation. Dermatologic: No rashes or ulcers noted.  No changes consistent with cellulitis. Lymph : No Cervical lymphadenopathy, no lichenification or skin changes of chronic lymphedema.  CBC Lab Results  Component Value Date   WBC 7.6 10/13/2015   HGB 12.8 10/13/2015   HCT 38.8 10/13/2015   MCV 89.0 10/13/2015   PLT 217.0 10/13/2015    BMET    Component Value Date/Time   NA 138 07/18/2016 1020   NA 140 03/11/2014   K 4.2 07/18/2016 1020   CL 102 07/18/2016 1020   CO2 31 07/18/2016 1020   GLUCOSE 282 (H) 07/18/2016 1020   BUN 15 07/18/2016 1020   BUN 14 03/11/2014   CREATININE 1.23 (H) 07/18/2016 1020   CALCIUM 9.8 07/18/2016 1020   GFRNONAA 37 (L) 08/02/2015 2327   GFRAA 43 (L) 08/02/2015 2327   Estimated Creatinine Clearance: 49.5 mL/min (by C-G formula based on SCr of 1.23 mg/dL (H)).  COAG No results found for: INR, PROTIME  Radiology US Pelvis Complete  Result Date: 07/16/2016 CLINICAL DATA:  Thickened endometrium on CT scan of the abdomen and pelvis dated 07/16/2016 EXAM: TRANSABDOMINAL ULTRASOUND OF PELVIS TECHNIQUE: Transabdominal ultrasound examination of the pelvis was performed including evaluation of the uterus, ovaries, adnexal regions, and pelvic  cul-de-sac. The patient unable to tolerate the transvaginal exam. COMPARISON:  CT scan dated 07/16/2016 FINDINGS: Uterus Measurements: 9.4 x 4.2 x 5.4 cm. No fibroids or other mass visualized. Endometrium Thickness: 14 mm, abnormally thickened.  No focal abnormality. Right ovary Not visualized. Left ovary Not visualized. Other findings:  No abnormal free fluid. IMPRESSION: Abnormal thickening of the endometrium to 14 mm. No other visible abnormality. The patient was unable to tolerate transvaginal ultrasound examination. Electronically Signed   By: Lorriane Shire M.D.   On: 07/16/2016 12:59   Ct Renal Stone Study  Result Date: 07/16/2016 CLINICAL DATA:  Pelvic pain and hematuria. EXAM: CT ABDOMEN AND PELVIS WITHOUT CONTRAST TECHNIQUE: Multidetector CT imaging of the abdomen and pelvis was performed following the standard protocol without IV contrast. COMPARISON:  CT scan 08/03/2015 FINDINGS: Lower chest: Patchy subpleural atelectasis noted at the right lung base. No worrisome pulmonary lesions or pleural effusion. The heart is normal in size. No pericardial effusion. Coronary artery calcifications are noted along with aortic calcifications. The distal esophagus is grossly normal. Few small calcifications noted in the wall of the esophagus are stable and may be related to prior inflammation. Hepatobiliary: No focal hepatic lesions or intrahepatic biliary dilatation. The gallbladder surgically absent. No common bile duct dilatation. Pancreas: No mass, inflammation or ductal dilatation. Mild fatty change in the pancreatic head. Mild pancreatic atrophy. Spleen: Normal size.  No focal lesions. Adrenals/Urinary Tract: The adrenal glands are normal. No renal, ureteral or bladder calculi or mass. Stomach/Bowel: The stomach, duodenum, small bowel and colon are grossly normal without oral contrast. No inflammatory changes, mass lesions or obstructive findings. A moderate-sized duodenum diverticulum is noted. The terminal  ileum is normal. The appendix is normal. Vascular/Lymphatic: Advanced atherosclerotic calcifications involving the aorta and branch vessels without definite aneurysm. Small scattered mesenteric and retroperitoneal lymph nodes but no mass or overt adenopathy. Reproductive: Abnormal endometrium for age. It measures a maximum of 16 mm. Recommend endometrial sampling to exclude endometrial cancer. The myometrium appears normal. Both ovaries appear normal. Other: There are few small scattered pelvic lymph nodes but these appears stable. No inguinal mass or adenopathy. No abdominal wall hernia or subcutaneous lesions. Musculoskeletal: Advanced degenerative changes involving the lower lumbar spine. Suspect broad-based disc protrusion at L4-5 with significant spinal and lateral recess stenosis. Recommend correlation with clinical findings. IMPRESSION: 1. No acute abdominal/pelvic findings, mass lesions or lymphadenopathy. 2. No renal, ureteral  or bladder calculi or mass. 3. Abnormal CT appearance of the endometrium for the patient's age. Recommend correlation with pelvic ultrasound examination and patient may require endometrial sampling. I would wonder if the hematuria could actually be due to uterine bleeding. 4. Advanced atherosclerotic calcifications involving the aorta and branch vessels. 5. 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 These results will be called to the ordering clinician or representative by the Radiologist Assistant, and communication documented in the PACS or zVision Dashboard. Electronically Signed   By: Marijo Sanes M.D.   On: 07/16/2016 10:52    Assessment/Plan 1. Stenosis of right carotid artery Recommend:  Given the patient's asymptomatic subcritical stenosis no further invasive testing or surgery at this time.  Duplex ultrasound shows <60% stenosis bilaterally.  Continue antiplatelet therapy as prescribed Continue management of CAD, HTN and Hyperlipidemia Healthy heart diet,   encouraged exercise at least 4 times per week Follow up in 12 months with duplex ultrasound and physical exam based on the stenosis of the right carotid artery   - VAS US CAROTID; Future  2. Chronic venous insufficiency No surgery or intervention at this point in time.    I have had a long discussion with the patient regarding venous insufficiency and why it  causes symptoms. I have discussed with the patient the chronic skin changes that accompany venous insufficiency and the long term sequela such as infection and ulceration.  Patient will begin wearing graduated compression stockings class 1 (20-30 mmHg) or compression wraps on a daily basis a prescription was given. The patient will put the stockings on first thing in the morning and removing them in the evening. The patient is instructed specifically not to sleep in the stockings.    In addition, behavioral modification including several periods of elevation of the lower extremities during the day will be continued. I have demonstrated that proper elevation is a position with the ankles at heart level.  The patient is instructed to begin routine exercise, especially walking on a daily basis  Following the review of the ultrasound the patient will follow up in 12 months to reassess the degree of swelling and the control that graduated compression stockings or compression wraps  is offering, sooner if the swelling is causing increasing problems.   The patient can be assessed for a Lymph Pump at that time  3. Pain and swelling of lower leg, unspecified laterality See #2  4. Essential hypertension, benign Continue antihypertensive medications as already ordered, these medications have been reviewed and there are no changes at this time.   5. Primary osteoarthritis involving multiple joints Continue NSAID medications as already ordered, these medications have been reviewed and there are no changes at this time.     Hortencia Pilar,  MD  07/19/2016 9:16 AM

## 2016-07-21 LAB — HM MAMMOGRAPHY

## 2016-07-22 ENCOUNTER — Other Ambulatory Visit: Payer: Self-pay | Admitting: Family Medicine

## 2016-07-22 DIAGNOSIS — R062 Wheezing: Secondary | ICD-10-CM

## 2016-07-24 ENCOUNTER — Ambulatory Visit (INDEPENDENT_AMBULATORY_CARE_PROVIDER_SITE_OTHER): Payer: Medicare Other

## 2016-07-24 ENCOUNTER — Other Ambulatory Visit: Payer: Self-pay | Admitting: Family Medicine

## 2016-07-24 ENCOUNTER — Encounter: Payer: Self-pay | Admitting: Family Medicine

## 2016-07-24 VITALS — BP 152/84 | HR 87 | Resp 18

## 2016-07-24 DIAGNOSIS — R062 Wheezing: Secondary | ICD-10-CM

## 2016-07-24 DIAGNOSIS — I10 Essential (primary) hypertension: Secondary | ICD-10-CM | POA: Diagnosis not present

## 2016-07-24 NOTE — Progress Notes (Signed)
Patient comes in for 1 week blood pressure check.  She has not been checking blood pressure at home due to she has trouble getting blood pressure cuff on her arm.  Advised patient she could bring in her home monitor and we could show her how to place cuff on arm.   Please advise.

## 2016-07-24 NOTE — Progress Notes (Signed)
Patient's blood pressure is not well controlled. I would like to increase one of her medications. Please confirm her metoprolol dose with her. We will either increase this, her clonidine, or her spironolactone.

## 2016-07-24 NOTE — Progress Notes (Signed)
Verapamil and metoprolol discontinued per patient at hospital due to  Slow heart rate.  She is on spirolactone 25 mg 1 qd.  Clonidine 0.2 mg 1 qd.

## 2016-07-27 NOTE — Progress Notes (Signed)
Patient should be taking her clonidine twice daily. If she is only taking this once daily we can start with her taking it twice daily and see how her blood pressure does. She should return for a blood pressure check and bring her blood pressure cuff in the next several weeks. Thanks.

## 2016-07-27 NOTE — Addendum Note (Signed)
Addended by: Leone Haven on: 07/27/2016 07:21 PM   Modules accepted: Orders

## 2016-07-29 ENCOUNTER — Inpatient Hospital Stay
Admission: EM | Admit: 2016-07-29 | Discharge: 2016-07-31 | DRG: 378 | Disposition: A | Discharge status: Home / Self Care | Payer: Medicare Other | Attending: Internal Medicine | Admitting: Internal Medicine

## 2016-07-29 DIAGNOSIS — N182 Chronic kidney disease, stage 2 (mild): Secondary | ICD-10-CM | POA: Diagnosis present

## 2016-07-29 DIAGNOSIS — I130 Hypertensive heart and chronic kidney disease with heart failure and stage 1 through stage 4 chronic kidney disease, or unspecified chronic kidney disease: Secondary | ICD-10-CM | POA: Diagnosis present

## 2016-07-29 DIAGNOSIS — I5032 Chronic diastolic (congestive) heart failure: Secondary | ICD-10-CM | POA: Diagnosis present

## 2016-07-29 DIAGNOSIS — K648 Other hemorrhoids: Secondary | ICD-10-CM | POA: Diagnosis present

## 2016-07-29 DIAGNOSIS — E1122 Type 2 diabetes mellitus with diabetic chronic kidney disease: Secondary | ICD-10-CM | POA: Diagnosis present

## 2016-07-29 DIAGNOSIS — Z8249 Family history of ischemic heart disease and other diseases of the circulatory system: Secondary | ICD-10-CM | POA: Diagnosis not present

## 2016-07-29 DIAGNOSIS — E1165 Type 2 diabetes mellitus with hyperglycemia: Secondary | ICD-10-CM | POA: Diagnosis present

## 2016-07-29 DIAGNOSIS — Z7982 Long term (current) use of aspirin: Secondary | ICD-10-CM

## 2016-07-29 DIAGNOSIS — Z833 Family history of diabetes mellitus: Secondary | ICD-10-CM

## 2016-07-29 DIAGNOSIS — IMO0002 Reserved for concepts with insufficient information to code with codable children: Secondary | ICD-10-CM | POA: Diagnosis present

## 2016-07-29 DIAGNOSIS — K625 Hemorrhage of anus and rectum: Secondary | ICD-10-CM | POA: Diagnosis present

## 2016-07-29 DIAGNOSIS — Z823 Family history of stroke: Secondary | ICD-10-CM

## 2016-07-29 DIAGNOSIS — K649 Unspecified hemorrhoids: Secondary | ICD-10-CM | POA: Diagnosis present

## 2016-07-29 DIAGNOSIS — F419 Anxiety disorder, unspecified: Secondary | ICD-10-CM | POA: Diagnosis present

## 2016-07-29 DIAGNOSIS — Z880 Allergy status to penicillin: Secondary | ICD-10-CM | POA: Diagnosis not present

## 2016-07-29 DIAGNOSIS — N183 Chronic kidney disease, stage 3 unspecified: Secondary | ICD-10-CM | POA: Diagnosis present

## 2016-07-29 DIAGNOSIS — I10 Essential (primary) hypertension: Secondary | ICD-10-CM | POA: Diagnosis present

## 2016-07-29 DIAGNOSIS — E7800 Pure hypercholesterolemia, unspecified: Secondary | ICD-10-CM | POA: Diagnosis present

## 2016-07-29 DIAGNOSIS — Z794 Long term (current) use of insulin: Secondary | ICD-10-CM

## 2016-07-29 DIAGNOSIS — K5791 Diverticulosis of intestine, part unspecified, without perforation or abscess with bleeding: Principal | ICD-10-CM | POA: Diagnosis present

## 2016-07-29 LAB — COMPREHENSIVE METABOLIC PANEL
ALBUMIN: 4.4 g/dL (ref 3.5–5.0)
ALK PHOS: 44 U/L (ref 38–126)
ALT: 27 U/L (ref 14–54)
ANION GAP: 7 (ref 5–15)
AST: 28 U/L (ref 15–41)
BILIRUBIN TOTAL: 1 mg/dL (ref 0.3–1.2)
BUN: 25 mg/dL — AB (ref 6–20)
CALCIUM: 9.6 mg/dL (ref 8.9–10.3)
CO2: 28 mmol/L (ref 22–32)
CREATININE: 1.51 mg/dL — AB (ref 0.44–1.00)
Chloride: 102 mmol/L (ref 101–111)
GFR calc Af Amer: 37 mL/min — ABNORMAL LOW (ref >60)
GFR calc non Af Amer: 32 mL/min — ABNORMAL LOW (ref >60)
GLUCOSE: 302 mg/dL — AB (ref 65–99)
Potassium: 4.2 mmol/L (ref 3.5–5.1)
SODIUM: 137 mmol/L (ref 135–145)
TOTAL PROTEIN: 7.8 g/dL (ref 6.5–8.1)

## 2016-07-29 LAB — TYPE AND SCREEN
ABO/RH(D): A POS
ANTIBODY SCREEN: NEGATIVE

## 2016-07-29 LAB — CBC
HCT: 40 % (ref 35.0–47.0)
HEMOGLOBIN: 13.1 g/dL (ref 12.0–16.0)
MCH: 29.5 pg (ref 26.0–34.0)
MCHC: 32.8 g/dL (ref 32.0–36.0)
MCV: 90 fL (ref 80.0–100.0)
PLATELETS: 209 10*3/uL (ref 150–440)
RBC: 4.45 MIL/uL (ref 3.80–5.20)
RDW: 14 % (ref 11.5–14.5)
WBC: 7.3 10*3/uL (ref 3.6–11.0)

## 2016-07-29 MED ORDER — SODIUM CHLORIDE 0.9 % IV SOLN
80.0000 mg | Freq: Once | INTRAVENOUS | Status: AC
  Administered 2016-07-30: 80 mg via INTRAVENOUS
  Filled 2016-07-29: qty 80

## 2016-07-29 MED ORDER — PANTOPRAZOLE SODIUM 40 MG IV SOLR: 40.0000 mg | Freq: Two times a day (BID) | INTRAVENOUS | Status: DC

## 2016-07-29 MED ORDER — INSULIN ASPART 100 UNIT/ML ~~LOC~~ SOLN
0.0000 [IU] | Freq: Every day | SUBCUTANEOUS | Status: DC
  Administered 2016-07-30: 2 [IU] via SUBCUTANEOUS
  Filled 2016-07-29: qty 2

## 2016-07-29 MED ORDER — SODIUM CHLORIDE 0.9 % IV SOLN
8.0000 mg/h | INTRAVENOUS | Status: DC
  Administered 2016-07-30 (×2): 8 mg/h via INTRAVENOUS
  Filled 2016-07-29 (×2): qty 80

## 2016-07-29 NOTE — ED Provider Notes (Signed)
[facility] Emergency Department Provider Note        Time seen: ----------------------------------------- 9:20 PM on 07/29/2016 -----------------------------------------    I have reviewed the triage vital signs and the nursing notes.   HISTORY  Chief Complaint Rectal Bleeding    HPI Alexis Farmer is a 76 y.o. female who presents to ER for rectal bleeding when using the bathroom today. Patient reports having history of diverticulitis 4 years ago and also having a possible fissure. Patient states she is having bright red blood per rectum with some pain. She thinks she has lost probably half a pint of blood. She denies feeling weak or dizzy. Does have rectal pain and is not sure if she has a hemorrhoid. She denies fevers or chills.   Past Medical History:  Diagnosis Date  . Arthritis   . Chickenpox   . Diabetes mellitus without complication ([facility])   . Diverticulitis   . GI bleed   . High cholesterol   . History of blood transfusion   . Hypertension   . Renal insufficiency     Patient Active Problem List   Diagnosis Date Noted  . Carotid stenosis 07/19/2016  . Chronic venous insufficiency 07/19/2016  . Pain and swelling of lower leg 07/19/2016  . Bradycardia 07/17/2016  . Postmenopausal bleeding 07/17/2016  . Chronic diastolic CHF (congestive heart failure) ([place]) 05/31/2016  . Cough 01/25/2016  . Chronic fatigue 11/15/2015  . Depression 10/13/2015  . Osteoarthritis 10/13/2015  . Vertigo 10/13/2015  . Anxiety 09/13/2015  . BP (high blood pressure) 09/13/2015  . GI bleed 08/03/2015  . Hyperlipidemia 08/26/2014  . Essential hypertension, benign 04/08/2014  . Diabetes mellitus type II, uncontrolled ([place]) 04/08/2014  . CKD (chronic kidney disease) stage 3, GFR 30-59 ml/min 04/08/2014  . Pedal edema 04/08/2014  . Morbid obesity ([place]) 04/08/2014  . Lower back pain 04/08/2014  . Diabetic polyneuropathy ([place]) 04/08/2014    Past Surgical  History:  Procedure Laterality Date  . APPENDECTOMY    . ECTOPIC PREGNANCY SURGERY    . gallbladder     . THYROIDECTOMY, PARTIAL      Allergies Penicillins  Social History Social History  Substance Use Topics  . Smoking status: Never Smoker  . Smokeless tobacco: Never Used  . Alcohol use No    Review of Systems Constitutional: Negative for fever. Cardiovascular: Negative for chest pain. Respiratory: Negative for shortness of breath. Gastrointestinal: Negative for abdominal pain, vomiting and diarrhea.Positive for rectal bleeding Genitourinary: Negative for dysuria. Musculoskeletal: Negative for back pain. Skin: Negative for rash. Neurological: Negative for headaches, focal weakness or numbness.  10-point ROS otherwise negative.  ____________________________________________   PHYSICAL EXAM:  VITAL SIGNS: ED Triage Vitals  Enc Vitals Group     BP 07/29/16 2018 (!) 203/75     Pulse Rate 07/29/16 2018 100     Resp 07/29/16 2018 20     Temp 07/29/16 2018 98.8 F (37.1 C)     Temp Source 07/29/16 2018 Oral     SpO2 07/29/16 2018 95 %     Weight --      Height --      Head Circumference --      Peak Flow --      Pain Score 07/29/16 2021 0     Pain Loc --      Pain Edu? --      Excl. in [place]? --     Constitutional: Alert and oriented. Well appearing and in no distress. Eyes:  Conjunctivae are normal. Normal extraocular movements. ENT   Head: Normocephalic and atraumatic.   Nose: No congestion/rhinnorhea.   Mouth/Throat: Mucous membranes are moist.   Neck: No stridor. Cardiovascular: Normal rate, regular rhythm. No murmurs, rubs, or gallops. Respiratory: Normal respiratory effort without tachypnea nor retractions. Breath sounds are clear and equal bilaterally. No wheezes/rales/rhonchi. Gastrointestinal: Soft and nontender. Normal bowel sounds Rectal: Small external hemorrhoid, nonbleeding. No gross blood noted. Tenderness on examination as  noted Musculoskeletal: Nontender with normal range of motion in all extremities. No lower extremity tenderness nor edema. Neurologic:  Normal speech and language. No gross focal neurologic deficits are appreciated.  Skin:  Skin is warm, dry and intact. No rash noted. Psychiatric: Mood and affect are normal. Speech and behavior are normal.  ____________________________________________  ED COURSE:  Pertinent labs & imaging results that were available during my care of the patient were reviewed by me and considered in my medical decision making (see chart for details). Patient presents to ER with heavy rectal bleeding throughout the day. We will assess with labs and likely need hospital observation.   Procedures ____________________________________________   LABS (pertinent positives/negatives)  Labs Reviewed  COMPREHENSIVE METABOLIC PANEL - Abnormal; Notable for the following:       Result Value   Glucose, Bld 302 (*)    BUN 25 (*)    Creatinine, Ser 1.51 (*)    GFR calc non Af Amer 32 (*)    GFR calc Af Amer 37 (*)    All other components within normal limits  CBC  POC OCCULT BLOOD, ED  TYPE AND SCREEN   ____________________________________________  FINAL ASSESSMENT AND PLAN  Rectal bleeding  Plan: Patient with labs as dictated above. Patient had significant rectal bleeding today. Initial labs are unremarkable. Due to the extensive nature of her bleeding earlier, I will discuss with the hospitalist for admission and GI consultation.   Earleen Newport, MD   Note: This note was generated in part or whole with voice recognition software. Voice recognition is usually quite accurate but there are transcription errors that can and very often do occur. I apologize for any typographical errors that were not detected and corrected.     Earleen Newport, MD 07/29/16 [phone]

## 2016-07-29 NOTE — H&P (Signed)
[place] at [place] NAME: Alexis Farmer    MR#:  [id number]  DATE OF BIRTH:  20-Feb-1941  DATE OF ADMISSION:  07/29/2016  PRIMARY CARE PHYSICIAN: Tommi Rumps, MD   REQUESTING/REFERRING PHYSICIAN: Jimmye Norman, MD  CHIEF COMPLAINT:   Chief Complaint  Patient presents with  . Rectal Bleeding    HISTORY OF PRESENT ILLNESS:  Alexis Farmer  is a 76 y.o. female who presents with 3-4 episodes today of painless rectal bleeding. Patient states that it has not been large volume, but it has been 3-4 episodes of bright red leading per her rectum. She had a diverticular bleed about 4 years ago, but states that at that time she did have abdominal colic and bright blood along with clots. She has not noticed any clots this time, and states that the volume of bleeding has been smaller. Hospitalists were called for further evaluation  PAST MEDICAL HISTORY:   Past Medical History:  Diagnosis Date  . Arthritis   . Chickenpox   . Diabetes mellitus without complication ([place])   . Diverticulitis   . GI bleed   . High cholesterol   . History of blood transfusion   . Hypertension   . Renal insufficiency     PAST SURGICAL HISTORY:   Past Surgical History:  Procedure Laterality Date  . APPENDECTOMY    . ECTOPIC PREGNANCY SURGERY    . gallbladder     . THYROIDECTOMY, PARTIAL      SOCIAL HISTORY:   Social History  Substance Use Topics  . Smoking status: Never Smoker  . Smokeless tobacco: Never Used  . Alcohol use No    FAMILY HISTORY:   Family History  Problem Relation Age of Onset  . Diabetes Mother   . Hypertension Mother   . Stroke Mother   . Diabetes Other   . Diabetes Sister   . Heart disease Sister     DRUG ALLERGIES:   Allergies  Allergen Reactions  . Penicillins Hives, Swelling and Other (See Comments)    Has patient had a PCN reaction causing immediate rash, facial/tongue/throat swelling, SOB or  lightheadedness with hypotension: Yes Has patient had a PCN reaction causing severe rash involving mucus membranes or skin necrosis: No Has patient had a PCN reaction that required hospitalization No Has patient had a PCN reaction occurring within the last 10 years: Yes If all of the above answers are "NO", then may proceed with Cephalosporin use.     MEDICATIONS AT HOME:   Prior to Admission medications   Medication Sig Start Date End Date Taking? Authorizing Provider  aspirin 81 MG tablet Take 81 mg by mouth daily.   Yes Historical Provider, MD  B-D INS SYRINGE 0.5CC/30GX1/2" 30G X 1/2" 0.5 ML MISC USE AS DIRECTED 11/16/15  Yes Leone Haven, MD  cloNIDine (CATAPRES) 0.2 MG tablet TAKE 1 TABLET (0.2 MG TOTAL) BY MOUTH 2 (TWO) TIMES DAILY. 02/03/16  Yes Minna Merritts, MD  insulin NPH Human (NOVOLIN N) 100 UNIT/ML injection Inject 0.2-0.33 mLs (20-33 Units total) into the skin 2 (two) times daily before a meal. Takes 33 units am and 20 units pm daily. 03/07/16  Yes Leone Haven, MD  Multiple Vitamin (MULTIVITAMIN WITH MINERALS) TABS tablet Take 1 tablet by mouth daily.   Yes Historical Provider, MD  NOVOLIN R 100 UNIT/ML injection INJECT 0.15 MLS (15 UNITS TOTAL) INTO THE SKIN 3 (THREE) TIMES DAILY BEFORE MEALS. 06/27/16  Yes Angela Adam  Caryl Bis, MD  polyethylene glycol powder (GLYCOLAX/MIRALAX) powder TAKE 17 G BY MOUTH DAILY. 03/12/16  Yes Leone Haven, MD  rosuvastatin (CRESTOR) 10 MG tablet TAKE 1 TABLET (10 MG TOTAL) BY MOUTH DAILY. 11/15/15  Yes Leone Haven, MD  spironolactone (ALDACTONE) 25 MG tablet Take 1 tablet (25 mg total) by mouth daily. 11/15/15  Yes Leone Haven, MD  torsemide (DEMADEX) 20 MG tablet Take 1 tablet (20 mg total) by mouth 2 (two) times daily. 05/31/16  Yes Minna Merritts, MD  valsartan (DIOVAN) 320 MG tablet Take 1 tablet (320 mg total) by mouth daily. 11/15/15  Yes Leone Haven, MD  albuterol (PROVENTIL HFA;VENTOLIN HFA) 108 (90 Base)  MCG/ACT inhaler Inhale 2 puffs into the lungs every 6 (six) hours as needed for wheezing or shortness of breath. Patient not taking: Reported on 07/29/2016 07/17/16   Leone Haven, MD    REVIEW OF SYSTEMS:  Review of Systems  Constitutional: Negative for chills, fever, malaise/fatigue and weight loss.  HENT: Negative for ear pain, hearing loss and tinnitus.   Eyes: Negative for blurred vision, double vision, pain and redness.  Respiratory: Negative for cough, hemoptysis and shortness of breath.   Cardiovascular: Negative for chest pain, palpitations, orthopnea and leg swelling.  Gastrointestinal: Positive for blood in stool. Negative for abdominal pain, constipation, diarrhea, nausea and vomiting.  Genitourinary: Negative for dysuria, frequency and hematuria.  Musculoskeletal: Negative for back pain, joint pain and neck pain.  Skin:       No acne, rash, or lesions  Neurological: Negative for dizziness, tremors, focal weakness and weakness.  Endo/Heme/Allergies: Negative for polydipsia. Does not bruise/bleed easily.  Psychiatric/Behavioral: Negative for depression. The patient is not nervous/anxious and does not have insomnia.      VITAL SIGNS:   Vitals:   07/29/16 2018 07/29/16 2158  BP: (!) 203/75 (!) 124/52  Pulse: 100 89  Resp: 20 15  Temp: 98.8 F (37.1 C)   TempSrc: Oral   SpO2: 95% 99%   Wt Readings from Last 3 Encounters:  07/19/16 126.1 kg (278 lb)  07/17/16 126.6 kg (279 lb 3.2 oz)  07/16/16 124.7 kg (275 lb)    PHYSICAL EXAMINATION:  Physical Exam  Vitals reviewed. Constitutional: She is oriented to person, place, and time. She appears well-developed and well-nourished. No distress.  HENT:  Head: Normocephalic and atraumatic.  Mouth/Throat: Oropharynx is clear and moist.  Eyes: Conjunctivae and EOM are normal. Pupils are equal, round, and reactive to light. No scleral icterus.  Neck: Normal range of motion. Neck supple. No JVD present. No thyromegaly  present.  Cardiovascular: Normal rate, regular rhythm and intact distal pulses.  Exam reveals no gallop and no friction rub.   No murmur heard. Respiratory: Effort normal and breath sounds normal. No respiratory distress. She has no wheezes. She has no rales.  GI: Soft. Bowel sounds are normal. She exhibits no distension. There is no tenderness.  Musculoskeletal: Normal range of motion. She exhibits no edema.  No arthritis, no gout  Lymphadenopathy:    She has no cervical adenopathy.  Neurological: She is alert and oriented to person, place, and time. No cranial nerve deficit.  No dysarthria, no aphasia  Skin: Skin is warm and dry. No rash noted. No erythema.  Psychiatric: She has a normal mood and affect. Her behavior is normal. Judgment and thought content normal.    LABORATORY PANEL:   CBC  Recent Labs Lab 07/29/16 2030  WBC 7.3  HGB 13.1  HCT 40.0  PLT 209   ------------------------------------------------------------------------------------------------------------------  Chemistries   Recent Labs Lab 07/29/16 2030  NA 137  K 4.2  CL 102  CO2 28  GLUCOSE 302*  BUN 25*  CREATININE 1.51*  CALCIUM 9.6  AST 28  ALT 27  ALKPHOS 44  BILITOT 1.0   ------------------------------------------------------------------------------------------------------------------  Cardiac Enzymes No results for input(s): TROPONINI in the last 168 hours. ------------------------------------------------------------------------------------------------------------------  RADIOLOGY:  No results found.  EKG:   Orders placed or performed in visit on 05/31/16  . EKG 12-Lead    IMPRESSION AND PLAN:  Principal Problem:   Rectal bleed - unclear etiology, patient does have risk for possible diverticular bleed. However, she also states that she has a history of hemorrhoids, but she has not had bleeding like this from her hemorrhoids in the past. We will admit her with GI consult for the  morning. Active Problems:   Essential hypertension, benign - stable, continue home meds   Diabetes mellitus type II, uncontrolled ([place]) - sliding scale insulin with corresponding glucose checks   Chronic diastolic CHF (congestive heart failure) ([employer]) - continue home meds   CKD (chronic kidney disease) stage 3, GFR 30-59 ml/min - avoid nephrotoxins and monitor   Anxiety - home dose anxiolytics  All the records are reviewed and case discussed with ED provider. Management plans discussed with the patient and/or family.  DVT PROPHYLAXIS: Mechanical only  GI PROPHYLAXIS: PPI  ADMISSION STATUS: Inpatient  CODE STATUS: Full Code Status History    Date Active Date Inactive Code Status Order ID Comments User Context   08/03/2015  6:56 AM 08/04/2015  9:30 PM Full Code [id number]  Harrie Foreman, MD Inpatient      TOTAL TIME TAKING CARE OF THIS PATIENT: 45 minutes.    Alexis Farmer FIELDING 07/29/2016, 10:52 PM  [facility]  Office  [phone]  CC: Primary care physician; Tommi Rumps, MD

## 2016-07-29 NOTE — ED Notes (Signed)
Unable to leave room of pt in 16, pt unstable and aggressive - one to one

## 2016-07-29 NOTE — ED Triage Notes (Signed)
Symptoms began today.  Reports noticed rectal bleeding when using the bathroom today.  Reports history of diverticulitis 4 years ago.  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

## 2016-07-30 ENCOUNTER — Ambulatory Visit: Payer: Medicare Other | Admitting: Pharmacist

## 2016-07-30 ENCOUNTER — Encounter: Payer: Self-pay | Admitting: *Deleted

## 2016-07-30 DIAGNOSIS — K625 Hemorrhage of anus and rectum: Secondary | ICD-10-CM

## 2016-07-30 LAB — GLUCOSE, CAPILLARY
GLUCOSE-CAPILLARY: 291 mg/dL — AB (ref 65–99)
Glucose-Capillary: 193 mg/dL — ABNORMAL HIGH (ref 65–99)
Glucose-Capillary: 220 mg/dL — ABNORMAL HIGH (ref 65–99)
Glucose-Capillary: 245 mg/dL — ABNORMAL HIGH (ref 65–99)
Glucose-Capillary: 275 mg/dL — ABNORMAL HIGH (ref 65–99)

## 2016-07-30 LAB — BASIC METABOLIC PANEL
Anion gap: 5 (ref 5–15)
BUN: 21 mg/dL — AB (ref 6–20)
CHLORIDE: 106 mmol/L (ref 101–111)
CO2: 28 mmol/L (ref 22–32)
CREATININE: 1.25 mg/dL — AB (ref 0.44–1.00)
Calcium: 9.3 mg/dL (ref 8.9–10.3)
GFR calc Af Amer: 47 mL/min — ABNORMAL LOW (ref >60)
GFR calc non Af Amer: 40 mL/min — ABNORMAL LOW (ref >60)
Glucose, Bld: 199 mg/dL — ABNORMAL HIGH (ref 65–99)
Potassium: 4 mmol/L (ref 3.5–5.1)
Sodium: 139 mmol/L (ref 135–145)

## 2016-07-30 LAB — CBC
HCT: 36.4 % (ref 35.0–47.0)
Hemoglobin: 12.5 g/dL (ref 12.0–16.0)
MCH: 30.6 pg (ref 26.0–34.0)
MCHC: 34.4 g/dL (ref 32.0–36.0)
MCV: 88.9 fL (ref 80.0–100.0)
Platelets: 194 10*3/uL (ref 150–440)
RBC: 4.09 MIL/uL (ref 3.80–5.20)
RDW: 13.9 % (ref 11.5–14.5)
WBC: 7.3 10*3/uL (ref 3.6–11.0)

## 2016-07-30 MED ORDER — INSULIN ASPART 100 UNIT/ML ~~LOC~~ SOLN
0.0000 [IU] | Freq: Three times a day (TID) | SUBCUTANEOUS | Status: DC
  Administered 2016-07-30: 2 [IU] via SUBCUTANEOUS
  Administered 2016-07-30: 3 [IU] via SUBCUTANEOUS
  Filled 2016-07-30: qty 2
  Filled 2016-07-30: qty 3

## 2016-07-30 MED ORDER — ACETAMINOPHEN 325 MG PO TABS: 650.0000 mg | ORAL_TABLET | Freq: Four times a day (QID) | ORAL | Status: DC | PRN

## 2016-07-30 MED ORDER — PANTOPRAZOLE SODIUM 40 MG PO TBEC
40.0000 mg | DELAYED_RELEASE_TABLET | Freq: Every day | ORAL | Status: DC
  Administered 2016-07-30 – 2016-07-31 (×2): 40 mg via ORAL
  Filled 2016-07-30 (×2): qty 1

## 2016-07-30 MED ORDER — INSULIN ASPART 100 UNIT/ML ~~LOC~~ SOLN
0.0000 [IU] | Freq: Three times a day (TID) | SUBCUTANEOUS | Status: DC
  Administered 2016-07-30 – 2016-07-31 (×3): 8 [IU] via SUBCUTANEOUS
  Filled 2016-07-30: qty 8
  Filled 2016-07-30: qty 12
  Filled 2016-07-30: qty 8

## 2016-07-30 MED ORDER — INSULIN GLARGINE 100 UNIT/ML ~~LOC~~ SOLN
25.0000 [IU] | Freq: Every day | SUBCUTANEOUS | Status: DC
  Administered 2016-07-30: 25 [IU] via SUBCUTANEOUS
  Filled 2016-07-30 (×2): qty 0.25

## 2016-07-30 MED ORDER — ACETAMINOPHEN 650 MG RE SUPP: 650.0000 mg | Freq: Four times a day (QID) | RECTAL | Status: DC | PRN

## 2016-07-30 MED ORDER — HYDROCORTISONE 2.5 % RE CREA
TOPICAL_CREAM | Freq: Two times a day (BID) | RECTAL | Status: DC
  Administered 2016-07-30: 1 via RECTAL
  Administered 2016-07-30 – 2016-07-31 (×2): via RECTAL
  Filled 2016-07-30: qty 28.35

## 2016-07-30 MED ORDER — INSULIN ASPART 100 UNIT/ML ~~LOC~~ SOLN
0.0000 [IU] | Freq: Every day | SUBCUTANEOUS | Status: DC
  Administered 2016-07-30: 3 [IU] via SUBCUTANEOUS
  Filled 2016-07-30: qty 3

## 2016-07-30 MED ORDER — ONDANSETRON HCL 4 MG PO TABS: 4.0000 mg | ORAL_TABLET | Freq: Four times a day (QID) | ORAL | Status: DC | PRN

## 2016-07-30 MED ORDER — POLYETHYLENE GLYCOL 3350 17 G PO PACK
17.0000 g | PACK | Freq: Two times a day (BID) | ORAL | Status: DC
  Administered 2016-07-30 – 2016-07-31 (×3): 17 g via ORAL
  Filled 2016-07-30 (×3): qty 1

## 2016-07-30 MED ORDER — ALPRAZOLAM 0.5 MG PO TABS
0.5000 mg | ORAL_TABLET | Freq: Every evening | ORAL | Status: DC | PRN
  Administered 2016-07-30: 0.5 mg via ORAL
  Filled 2016-07-30: qty 1

## 2016-07-30 MED ORDER — CLONIDINE HCL 0.1 MG PO TABS
0.2000 mg | ORAL_TABLET | Freq: Two times a day (BID) | ORAL | Status: DC
  Administered 2016-07-30 – 2016-07-31 (×3): 0.2 mg via ORAL
  Filled 2016-07-30 (×3): qty 2

## 2016-07-30 MED ORDER — ONDANSETRON HCL 4 MG/2ML IJ SOLN: 4.0000 mg | Freq: Four times a day (QID) | INTRAMUSCULAR | Status: DC | PRN

## 2016-07-30 MED ORDER — FUROSEMIDE 20 MG PO TABS
20.0000 mg | ORAL_TABLET | Freq: Two times a day (BID) | ORAL | Status: DC
  Administered 2016-07-30 – 2016-07-31 (×2): 20 mg via ORAL
  Filled 2016-07-30 (×2): qty 1

## 2016-07-30 MED ORDER — SODIUM CHLORIDE 0.9 % IV SOLN
INTRAVENOUS | Status: DC
  Administered 2016-07-30: 02:00:00 via INTRAVENOUS

## 2016-07-30 NOTE — Progress Notes (Signed)
Spoke with patient she states she is taking clonidine twice daily misunderstood patient .     She is currently in hospital for rectal bleeding.  She will call once she gets out of hospital when she gets out to schedule appointment.

## 2016-07-30 NOTE — Progress Notes (Signed)
[place] at [place] NAME: Alexis Farmer    MR#:  [id number]  DATE OF BIRTH:  [birth year]  SUBJECTIVE:  CHIEF COMPLAINT:   Chief Complaint  Patient presents with  . Rectal Bleeding   Still has some rectal bleeding. REVIEW OF SYSTEMS:  Review of Systems  Constitutional: Negative for chills, fever and malaise/fatigue.  HENT: Negative for congestion.   Eyes: Negative for blurred vision and double vision.  Respiratory: Negative for cough, shortness of breath and stridor.   Cardiovascular: Negative for chest pain and leg swelling.  Gastrointestinal: Positive for blood in stool. Negative for abdominal pain, diarrhea, melena, nausea and vomiting.  Genitourinary: Negative for dysuria and hematuria.  Musculoskeletal: Negative for joint pain.  Neurological: Negative for dizziness, focal weakness, loss of consciousness and weakness.  Psychiatric/Behavioral: Negative for depression. The patient is not nervous/anxious.     DRUG ALLERGIES:   Allergies  Allergen Reactions  . Penicillins Hives, Swelling and Other (See Comments)    Has patient had a PCN reaction causing immediate rash, facial/tongue/throat swelling, SOB or lightheadedness with hypotension: Yes Has patient had a PCN reaction causing severe rash involving mucus membranes or skin necrosis: No Has patient had a PCN reaction that required hospitalization No Has patient had a PCN reaction occurring within the last 10 years: Yes If all of the above answers are "NO", then may proceed with Cephalosporin use.    VITALS:  Blood pressure (!) 141/72, pulse 76, temperature 98.1 F (36.7 C), temperature source Oral, resp. rate 20, height 5\' 3"  (1.6 m), weight 274 lb 8 oz (124.5 kg), SpO2 99 %. PHYSICAL EXAMINATION:  Physical Exam  Constitutional: She is oriented to person, place, and time and well-developed, well-nourished, and in no distress.  HENT:  Head: Normocephalic and atraumatic.    Mouth/Throat: Oropharynx is clear and moist.  Eyes: Conjunctivae and EOM are normal.  Neck: Normal range of motion. Neck supple. No JVD present. No tracheal deviation present.  Cardiovascular: Normal rate, regular rhythm and normal heart sounds.  Exam reveals no gallop.   No murmur heard. Pulmonary/Chest: Effort normal and breath sounds normal. No respiratory distress. She has no wheezes.  Abdominal: Soft. Bowel sounds are normal. She exhibits no distension. There is no tenderness.  Musculoskeletal: Normal range of motion. She exhibits no edema or tenderness.  Neurological: She is alert and oriented to person, place, and time. No cranial nerve deficit.  Skin: No rash noted. No erythema.  Psychiatric: Affect normal.   LABORATORY PANEL:   CBC  Recent Labs Lab 07/30/16 0535  WBC 7.3  HGB 12.5  HCT 36.4  PLT 194   ------------------------------------------------------------------------------------------------------------------ Chemistries   Recent Labs Lab 07/29/16 2030 07/30/16 0535  NA 137 139  K 4.2 4.0  CL 102 106  CO2 28 28  GLUCOSE 302* 199*  BUN 25* 21*  CREATININE 1.51* 1.25*  CALCIUM 9.6 9.3  AST 28  --   ALT 27  --   ALKPHOS 44  --   BILITOT 1.0  --    RADIOLOGY:  No results found. ASSESSMENT AND PLAN:   Rectal bleed Per Dr. Allen Norris, The patient will be treated for internal hemorrhoids since treatment for diverticulosis with bleeding would require surgery if the bleeding does not stop. She will also be restarted on her stool softeners. If the bleeding continues she may need to have a repeat colonoscopy or sigmoidoscopy and may need surgical intervention of her hemorrhoids.   follow-up hemoglobin.  Essential hypertension, benign - stable, continue home meds    Diabetes mellitus type II, uncontrolled ([place]) - sliding scale insulin with lantus 25 units.    Chronic diastolic CHF (congestive heart failure) ([facility]) - continue home meds   CKD (chronic kidney  disease) stage 3, GFR 30-59 ml/min - avoid nephrotoxins and monitor   Anxiety - home dose anxiolytics  All the records are reviewed and case discussed with Care Management/Social Worker. Management plans discussed with the patient, family and they are in agreement.  CODE STATUS: Full code  TOTAL TIME TAKING CARE OF THIS PATIENT: 35 minutes.   More than 50% of the time was spent in counseling/coordination of care: YES  POSSIBLE D/C IN 2 DAYS, DEPENDING ON CLINICAL CONDITION.   Demetrios Loll M.D on 07/30/2016 at 4:16 PM  Between 7am to 6pm - Pager - [phone]  After 6pm go to [email or web address] - Proofreader  [facility]  Office  [phone]  CC: Primary care physician; Tommi Rumps, MD  Note: This dictation was prepared with Dragon dictation along with smaller phrase technology. Any transcriptional errors that result from this process are unintentional.

## 2016-07-30 NOTE — Progress Notes (Signed)
Inpatient Diabetes Program Recommendations  AACE/ADA: New Consensus Statement on Inpatient Glycemic Control (2015)  Target Ranges:  Prepandial:   less than 140 mg/dL      Peak postprandial:   less than 180 mg/dL (1-2 hours)      Critically ill patients:  140 - 180 mg/dL   Results for Alexis Farmer, Alexis Farmer (MRN [id number]) as of 07/30/2016 13:10  Ref. Range 07/30/2016 00:24 07/30/2016 07:32 07/30/2016 11:38  Glucose-Capillary Latest Ref Range: 65 - 99 mg/dL 220 (H) 193 (H) 245 (H)    Admit Rectal Bleeding.   History: DM, CHF, CKD  Home DM Meds: NPH insulin 33 units AM/ 20 units PM        Regular Insulin 15 units TID  Current Orders: Novolog Sensitive Correction Scale/ SSI (0-9 units) TID AC + HS      MD- Please consider the following in-hospital insulin adjustments:  1. Start Lantus 25 units QHS (patient takes NPH at home but we do not have NPH Insulin on formulary- Lantus 25 units QHS would be about 50% total home dose of basal insulin to start)  2. Increase Novolog Correction Scale/ SSI to Moderate scale (0-15 units) TID AC + HS     --Will follow patient during hospitalization--  Wyn Quaker RN, MSN, CDE Diabetes Coordinator Inpatient Glycemic Control Team Team Pager: [phone] (8a-5p)

## 2016-07-30 NOTE — Consult Note (Signed)
Alexis Lame, MD [address] [place], [place] [address] Phone: [phone] Fax : [phone]  Consultation  Referring Provider:     Dr. Jannifer Farmer Primary Care Physician:  Alexis Rumps, MD Primary Gastroenterologist:  Dr. Allen Farmer         Reason for Consultation:     Rectal bleeding  Date of Admission:  07/29/2016 Date of Consultation:  07/30/2016         HPI:   Alexis Farmer is a 76 y.o. female who has a history of rectal bleeding. The patient had a colonoscopy in March 2016 with diverticulosis and internal hemorrhoids seen at that time. The patient comes in now with one day of rectal bleeding that she states happened yesterday. She had a normal hemoglobin on admission and this morning the hemoglobin was still normal. The patient had previous admissions for rectal bleeding as per patient. The patient reports some crampy abdominal pain in the lower abdomen that is like she has to go to the bathroom. The patient had a small amount of rectal bleeding this morning that she states was only on the toilet paper but the nursing staff reported to be also some blood-tinged water in the toilet. She denies any unexplained weight loss or change in her bowel habits. The patient states she takes a stool softener every day.  Past Medical History:  Diagnosis Date  . Arthritis   . Chickenpox   . Diabetes mellitus without complication ([place])   . Diverticulitis   . GI bleed   . High cholesterol   . History of blood transfusion   . Hypertension   . Renal insufficiency     Past Surgical History:  Procedure Laterality Date  . APPENDECTOMY    . ECTOPIC PREGNANCY SURGERY    . gallbladder     . THYROIDECTOMY, PARTIAL      Prior to Admission medications   Medication Sig Start Date End Date Taking? Authorizing Provider  aspirin 81 MG tablet Take 81 mg by mouth daily.   Yes Historical Provider, MD  B-D INS SYRINGE 0.5CC/30GX1/2" 30G X 1/2" 0.5 ML MISC USE AS DIRECTED 11/16/15  Yes Leone Haven, MD  cloNIDine (CATAPRES) 0.2 MG tablet TAKE 1 TABLET (0.2 MG TOTAL) BY MOUTH 2 (TWO) TIMES DAILY. 02/03/16  Yes Minna Merritts, MD  insulin NPH Human (NOVOLIN N) 100 UNIT/ML injection Inject 0.2-0.33 mLs (20-33 Units total) into the skin 2 (two) times daily before a meal. Takes 33 units am and 20 units pm daily. 03/07/16  Yes Leone Haven, MD  Multiple Vitamin (MULTIVITAMIN WITH MINERALS) TABS tablet Take 1 tablet by mouth daily.   Yes Historical Provider, MD  NOVOLIN R 100 UNIT/ML injection INJECT 0.15 MLS (15 UNITS TOTAL) INTO THE SKIN 3 (THREE) TIMES DAILY BEFORE MEALS. 06/27/16  Yes Leone Haven, MD  polyethylene glycol powder (GLYCOLAX/MIRALAX) powder TAKE 17 G BY MOUTH DAILY. 03/12/16  Yes Leone Haven, MD  rosuvastatin (CRESTOR) 10 MG tablet TAKE 1 TABLET (10 MG TOTAL) BY MOUTH DAILY. 11/15/15  Yes Leone Haven, MD  spironolactone (ALDACTONE) 25 MG tablet Take 1 tablet (25 mg total) by mouth daily. 11/15/15  Yes Leone Haven, MD  torsemide (DEMADEX) 20 MG tablet Take 1 tablet (20 mg total) by mouth 2 (two) times daily. 05/31/16  Yes Minna Merritts, MD  valsartan (DIOVAN) 320 MG tablet Take 1 tablet (320 mg total) by mouth daily. 11/15/15  Yes Leone Haven, MD  albuterol (  PROVENTIL HFA;VENTOLIN HFA) 108 (90 Base) MCG/ACT inhaler Inhale 2 puffs into the lungs every 6 (six) hours as needed for wheezing or shortness of breath. Patient not taking: Reported on 07/29/2016 07/17/16   Leone Haven, MD    Family History  Problem Relation Age of Onset  . Diabetes Mother   . Hypertension Mother   . Stroke Mother   . Diabetes Other   . Diabetes Sister   . Heart disease Sister      Social History  Substance Use Topics  . Smoking status: Never Smoker  . Smokeless tobacco: Never Used  . Alcohol use No    Allergies as of 07/29/2016 - Review Complete 07/29/2016  Allergen Reaction Noted  . Penicillins Hives, Swelling, and Other (See Comments) 03/25/2014      Review of Systems:    All systems reviewed and negative except where noted in HPI.   Physical Exam:  Vital signs in last 24 hours: Temp:  [98.1 F (36.7 C)-98.8 F (37.1 C)] 98.1 F (36.7 C) (02/12 0749) Pulse Rate:  [76-100] 76 (02/12 0749) Resp:  [15-22] 20 (02/12 0749) BP: (103-203)/(52-75) 141/72 (02/12 0749) SpO2:  [89 %-99 %] 99 % (02/12 0749) Weight:  [274 lb 8 oz (124.5 kg)] 274 lb 8 oz (124.5 kg) (02/12 0045) Last BM Date: 07/30/16 General:   Pleasant, cooperative in NAD Head:  Normocephalic and atraumatic. Eyes:   No icterus.   Conjunctiva pink. PERRLA. Ears:  Normal auditory acuity. Neck:  Supple; no masses or thyroidomegaly Lungs: Respirations even and unlabored. Lungs clear to auscultation bilaterally.   No wheezes, crackles, or rhonchi.  Heart:  Regular rate and rhythm;  Without murmur, clicks, rubs or gallops Abdomen:  Soft, nondistended, nontender. Normal bowel sounds. No appreciable masses or hepatomegaly.  No rebound or guarding.  Rectal:  Not performed. Msk:  Symmetrical without gross deformities.    Extremities:  Without edema, cyanosis or clubbing. Neurologic:  Alert and oriented x3;  grossly normal neurologically. Skin:  Intact without significant lesions or rashes. Cervical Nodes:  No significant cervical adenopathy. Psych:  Alert and cooperative. Normal affect.  LAB RESULTS:  Recent Labs  07/29/16 2030 07/30/16 0535  WBC 7.3 7.3  HGB 13.1 12.5  HCT 40.0 36.4  PLT 209 194   BMET  Recent Labs  07/29/16 2030 07/30/16 0535  NA 137 139  K 4.2 4.0  CL 102 106  CO2 28 28  GLUCOSE 302* 199*  BUN 25* 21*  CREATININE 1.51* 1.25*  CALCIUM 9.6 9.3   LFT  Recent Labs  07/29/16 2030  PROT 7.8  ALBUMIN 4.4  AST 28  ALT 27  ALKPHOS 44  BILITOT 1.0   PT/INR No results for input(s): LABPROT, INR in the last 72 hours.  STUDIES: No results found.    Impression / Plan:   Alexis Farmer is a 76 y.o. y/o female with a history of  hemorrhoids and diverticulosis who was admitted with rectal bleeding. The patient's hemoglobin has stayed normal. The patient will be treated for internal hemorrhoids since treatment for diverticulosis with bleeding would require surgery if the bleeding does not stop. She will also be restarted on her stool softeners. If the bleeding continues she may need to have a repeat colonoscopy or sigmoidoscopy and may need surgical intervention of her hemorrhoids. The patient agrees that medical therapy would be preferable to surgery. She has been explained the plan and agrees with it.  Thank you for involving me in the care of  this patient.      LOS: 1 day   Alexis Lame, MD  07/30/2016, 10:53 AM   Note: This dictation was prepared with Dragon dictation along with smaller phrase technology. Any transcriptional errors that result from this process are unintentional.

## 2016-07-30 NOTE — Progress Notes (Signed)
Dr. Jannifer Franklin called for patient's c/o anxiety and can't sleep; Requests home sleep med.  Acknowledged. New order written.

## 2016-07-30 NOTE — Plan of Care (Signed)
Problem: Physical Regulation: Goal: Ability to maintain clinical measurements within normal limits will improve Outcome: Progressing Having very little bloody stool; just blood-tinged water in toilet, no stool.

## 2016-07-31 ENCOUNTER — Telehealth: Payer: Self-pay | Admitting: *Deleted

## 2016-07-31 ENCOUNTER — Ambulatory Visit: Payer: Medicare Other | Attending: Family Medicine

## 2016-07-31 LAB — HEMOGLOBIN: HEMOGLOBIN: 12.6 g/dL (ref 12.0–16.0)

## 2016-07-31 LAB — GLUCOSE, CAPILLARY
GLUCOSE-CAPILLARY: 281 mg/dL — AB (ref 65–99)
GLUCOSE-CAPILLARY: 212 mg/dL — AB (ref 65–99)
Glucose-Capillary: 297 mg/dL — ABNORMAL HIGH (ref 65–99)

## 2016-07-31 MED ORDER — INSULIN ASPART 100 UNIT/ML ~~LOC~~ SOLN
4.0000 [IU] | Freq: Three times a day (TID) | SUBCUTANEOUS | Status: DC
  Administered 2016-07-31: 4 [IU] via SUBCUTANEOUS

## 2016-07-31 MED ORDER — HYDROCORTISONE 2.5 % RE CREA: TOPICAL_CREAM | Freq: Two times a day (BID) | RECTAL | 1 refills | Status: DC

## 2016-07-31 MED ORDER — INSULIN GLARGINE 100 UNIT/ML ~~LOC~~ SOLN
40.0000 [IU] | Freq: Every day | SUBCUTANEOUS | Status: DC
  Filled 2016-07-31: qty 0.4

## 2016-07-31 NOTE — Care Management Important Message (Signed)
Important Message  Patient Details  Name: Izetta Sakamoto MRN: [id number] Date of Birth: 1940-12-22   Medicare Important Message Given:  N/A - LOS <3 / Initial given by admissions    Beverly Sessions, RN 07/31/2016, 10:25 AM

## 2016-07-31 NOTE — Telephone Encounter (Signed)
Pt will discharge from [facility] on 02/13. Pt is scheduled for the HFU

## 2016-07-31 NOTE — Discharge Summary (Signed)
[facility] NAME: Alexis Farmer    MR#:  [id number]  DATE OF BIRTH:  Aug 26, 1940  DATE OF ADMISSION:  07/29/2016   ADMITTING PHYSICIAN: Lance Coon, MD  DATE OF DISCHARGE: 07/31/2016  PRIMARY CARE PHYSICIAN: Tommi Rumps, MD   ADMISSION DIAGNOSIS:  Rectal bleeding [K62.5] DISCHARGE DIAGNOSIS:  Principal Problem:   Rectal bleed Active Problems:   Essential hypertension, benign   Diabetes mellitus type II, uncontrolled (HCC)   CKD (chronic kidney disease) stage 3, GFR 30-59 ml/min   Anxiety   Chronic diastolic CHF (congestive heart failure) ([place])  SECONDARY DIAGNOSIS:   Past Medical History:  Diagnosis Date  . Arthritis   . Chickenpox   . Diabetes mellitus without complication ([place])   . Diverticulitis   . GI bleed   . High cholesterol   . History of blood transfusion   . Hypertension   . Renal insufficiency    HOSPITAL COURSE:  Rectal bleed Per Dr. Allen Norris, The patient will be treated for internal hemorrhoids since treatment for diverticulosis with bleeding would require surgery if the bleeding does not stop. She will also be restarted on her stool softeners. If the bleeding continues she may need to have a repeat colonoscopy or sigmoidoscopy and may need surgical intervention of her hemorrhoids. follow-up hemoglobin is stable.  Per Dr. Allen Norris, the patient can be discharged to home today and follow-up as outpatient.  Essential hypertension, benign - stable, continue home meds  Diabetes mellitus type II, better controlled (HCC) - sliding scale insulin with lantus 40 units.  Chronic diastolic CHF (congestive heart failure) (HCC) - continue home meds CKD (chronic kidney disease) stage 3, GFR 30-59 ml/min - avoid nephrotoxins and monitor Anxiety - home dose anxiolytics I discussed with  Dr. Allen Norris. DISCHARGE CONDITIONS:  Stable, discharge to home today. CONSULTS OBTAINED:  Treatment Team:  [name],  MD DRUG ALLERGIES:   Allergies  Allergen Reactions  . Penicillins Hives, Swelling and Other (See Comments)    Has patient had a PCN reaction causing immediate rash, facial/tongue/throat swelling, SOB or lightheadedness with hypotension: Yes Has patient had a PCN reaction causing severe rash involving mucus membranes or skin necrosis: No Has patient had a PCN reaction that required hospitalization No Has patient had a PCN reaction occurring within the last 10 years: Yes If all of the above answers are "NO", then may proceed with Cephalosporin use.    DISCHARGE MEDICATIONS:   Allergies as of 07/31/2016      Reactions   Penicillins Hives, Swelling, Other (See Comments)   Has patient had a PCN reaction causing immediate rash, facial/tongue/throat swelling, SOB or lightheadedness with hypotension: Yes Has patient had a PCN reaction causing severe rash involving mucus membranes or skin necrosis: No Has patient had a PCN reaction that required hospitalization No Has patient had a PCN reaction occurring within the last 10 years: Yes If all of the above answers are "NO", then may proceed with Cephalosporin use.      Medication List    STOP taking these medications   aspirin 81 MG tablet     TAKE these medications   albuterol 108 (90 Base) MCG/ACT inhaler Commonly known as:  PROVENTIL HFA;VENTOLIN HFA Inhale 2 puffs into the lungs every 6 (six) hours as needed for wheezing or shortness of breath.   ALPRAZolam 0.5 MG tablet Commonly known as:  XANAX Take 0.5 mg by mouth at bedtime as needed for anxiety or sleep.  B-D INS SYRINGE 0.5CC/30GX1/2" 30G X 1/2" 0.5 ML Misc Generic drug:  Insulin Syringe-Needle U-100 USE AS DIRECTED   cloNIDine 0.2 MG tablet Commonly known as:  CATAPRES TAKE 1 TABLET (0.2 MG TOTAL) BY MOUTH 2 (TWO) TIMES DAILY.   hydrocortisone 2.5 % rectal cream Commonly known as:  ANUSOL-HC Place rectally 2 (two) times daily.   insulin NPH Human 100 UNIT/ML  injection Commonly known as:  NOVOLIN N Inject 0.2-0.33 mLs (20-33 Units total) into the skin 2 (two) times daily before a meal. Takes 33 units am and 20 units pm daily.   multivitamin with minerals Tabs tablet Take 1 tablet by mouth daily.   NOVOLIN R 250 units/2.49mL (100 units/mL) injection Generic drug:  insulin regular INJECT 0.15 MLS (15 UNITS TOTAL) INTO THE SKIN 3 (THREE) TIMES DAILY BEFORE MEALS.   polyethylene glycol powder powder Commonly known as:  GLYCOLAX/MIRALAX TAKE 17 G BY MOUTH DAILY.   rosuvastatin 10 MG tablet Commonly known as:  CRESTOR TAKE 1 TABLET (10 MG TOTAL) BY MOUTH DAILY.   spironolactone 25 MG tablet Commonly known as:  ALDACTONE Take 1 tablet (25 mg total) by mouth daily.   torsemide 20 MG tablet Commonly known as:  DEMADEX Take 1 tablet (20 mg total) by mouth 2 (two) times daily.   valsartan 320 MG tablet Commonly known as:  DIOVAN Take 1 tablet (320 mg total) by mouth daily.        DISCHARGE INSTRUCTIONS:  See AVS.  If you experience worsening of your admission symptoms, develop shortness of breath, life threatening emergency, suicidal or homicidal thoughts you must seek medical attention immediately by calling 911 or calling your MD immediately  if symptoms less severe.  You Must read complete instructions/literature along with all the possible adverse reactions/side effects for all the Medicines you take and that have been prescribed to you. Take any new Medicines after you have completely understood and accpet all the possible adverse reactions/side effects.   Please note  You were cared for by a hospitalist during your hospital stay. If you have any questions about your discharge medications or the care you received while you were in the hospital after you are discharged, you can call the unit and asked to speak with the hospitalist on call if the hospitalist that took care of you is not available. Once you are discharged, your primary  care physician will handle any further medical issues. Please note that NO REFILLS for any discharge medications will be authorized once you are discharged, as it is imperative that you return to your primary care physician (or establish a relationship with a primary care physician if you do not have one) for your aftercare needs so that they can reassess your need for medications and monitor your lab values.    On the day of Discharge:  VITAL SIGNS:  Blood pressure (!) 136/57, pulse 89, temperature 98.3 F (36.8 C), temperature source Oral, resp. rate 20, height 5\' 3"  (1.6 m), weight 274 lb 8 oz (124.5 kg), SpO2 100 %. PHYSICAL EXAMINATION:  GENERAL:  76 y.o.-year-old patient lying in the bed with no acute distress. Obese. EYES: Pupils equal, round, reactive to light and accommodation. No scleral icterus. Extraocular muscles intact.  HEENT: Head atraumatic, normocephalic. Oropharynx and nasopharynx clear.  NECK:  Supple, no jugular venous distention. No thyroid enlargement, no tenderness.  LUNGS: Normal breath sounds bilaterally, no wheezing, rales,rhonchi or crepitation. No use of accessory muscles of respiration.  CARDIOVASCULAR: S1, S2 normal. No  murmurs, rubs, or gallops.  ABDOMEN: Soft, non-tender, non-distended. Bowel sounds present. No organomegaly or mass.  EXTREMITIES: No pedal edema, cyanosis, or clubbing.  NEUROLOGIC: Cranial nerves II through XII are intact. Muscle strength 5/5 in all extremities. Sensation intact. Gait not checked.  PSYCHIATRIC: The patient is alert and oriented x 3.  SKIN: No obvious rash, lesion, or ulcer.  DATA REVIEW:   CBC  Recent Labs Lab 07/30/16 0535 07/31/16 0444  WBC 7.3  --   HGB 12.5 12.6  HCT 36.4  --   PLT 194  --     Chemistries   Recent Labs Lab 07/29/16 2030 07/30/16 0535  NA 137 139  K 4.2 4.0  CL 102 106  CO2 28 28  GLUCOSE 302* 199*  BUN 25* 21*  CREATININE 1.51* 1.25*  CALCIUM 9.6 9.3  AST 28  --   ALT 27  --     ALKPHOS 44  --   BILITOT 1.0  --      Microbiology Results  Results for orders placed or performed during the hospital encounter of 07/16/16  Urine culture     Status: Abnormal   Collection Time: 07/16/16  8:52 AM  Result Value Ref Range Status   Specimen Description URINE, RANDOM  Final   Special Requests NONE  Final   Culture MULTIPLE SPECIES PRESENT, SUGGEST RECOLLECTION (A)  Final   Report Status 07/18/2016 FINAL  Final    RADIOLOGY:  No results found.   Management plans discussed with the patient, family and they are in agreement.  CODE STATUS: Full Code   TOTAL TIME TAKING CARE OF THIS PATIENT: 31 minutes.    Demetrios Loll M.D on 07/31/2016 at 3:13 PM  Between 7am to 6pm - Pager - [phone]  After 6pm go to [email or web address] - Proofreader  [facility]  Hospitalists  Office  [phone]  CC: Primary care physician; Tommi Rumps, MD   Note: This dictation was prepared with Dragon dictation along with smaller phrase technology. Any transcriptional errors that result from this process are unintentional.

## 2016-07-31 NOTE — Progress Notes (Signed)
07/31/2016 5:22 PM  Alexis Farmer Kitchen to be D/C'd Home per MD order.  Discussed prescriptions and follow up appointments with the patient. Prescriptions given to patient, medication list explained in detail. Pt verbalized understanding.  Allergies as of 07/31/2016      Reactions   Penicillins Hives, Swelling, Other (See Comments)   Has patient had a PCN reaction causing immediate rash, facial/tongue/throat swelling, SOB or lightheadedness with hypotension: Yes Has patient had a PCN reaction causing severe rash involving mucus membranes or skin necrosis: No Has patient had a PCN reaction that required hospitalization No Has patient had a PCN reaction occurring within the last 10 years: Yes If all of the above answers are "NO", then may proceed with Cephalosporin use.      Medication List    STOP taking these medications   aspirin 81 MG tablet     TAKE these medications   albuterol 108 (90 Base) MCG/ACT inhaler Commonly known as:  PROVENTIL HFA;VENTOLIN HFA Inhale 2 puffs into the lungs every 6 (six) hours as needed for wheezing or shortness of breath.   ALPRAZolam 0.5 MG tablet Commonly known as:  XANAX Take 0.5 mg by mouth at bedtime as needed for anxiety or sleep.   B-D INS SYRINGE 0.5CC/30GX1/2" 30G X 1/2" 0.5 ML Misc Generic drug:  Insulin Syringe-Needle U-100 USE AS DIRECTED   cloNIDine 0.2 MG tablet Commonly known as:  CATAPRES TAKE 1 TABLET (0.2 MG TOTAL) BY MOUTH 2 (TWO) TIMES DAILY.   hydrocortisone 2.5 % rectal cream Commonly known as:  ANUSOL-HC Place rectally 2 (two) times daily.   insulin NPH Human 100 UNIT/ML injection Commonly known as:  NOVOLIN N Inject 0.2-0.33 mLs (20-33 Units total) into the skin 2 (two) times daily before a meal. Takes 33 units am and 20 units pm daily.   multivitamin with minerals Tabs tablet Take 1 tablet by mouth daily.   NOVOLIN R 250 units/2.24mL (100 units/mL) injection Generic drug:  insulin regular INJECT 0.15 MLS (15 UNITS  TOTAL) INTO THE SKIN 3 (THREE) TIMES DAILY BEFORE MEALS.   polyethylene glycol powder powder Commonly known as:  GLYCOLAX/MIRALAX TAKE 17 G BY MOUTH DAILY.   rosuvastatin 10 MG tablet Commonly known as:  CRESTOR TAKE 1 TABLET (10 MG TOTAL) BY MOUTH DAILY.   spironolactone 25 MG tablet Commonly known as:  ALDACTONE Take 1 tablet (25 mg total) by mouth daily.   torsemide 20 MG tablet Commonly known as:  DEMADEX Take 1 tablet (20 mg total) by mouth 2 (two) times daily.   valsartan 320 MG tablet Commonly known as:  DIOVAN Take 1 tablet (320 mg total) by mouth daily.       Vitals:   07/31/16 0526 07/31/16 0756  BP: (!) 107/47 (!) 136/57  Pulse: 65 89  Resp: 20 20  Temp: 97.8 F (36.6 C) 98.3 F (36.8 C)    Skin clean, dry and intact without evidence of skin break down, no evidence of skin tears noted. IV catheter discontinued intact. Site without signs and symptoms of complications. Dressing and pressure applied. Pt denies pain at this time. No complaints noted.  An After Visit Summary was printed and given to the patient. Patient escorted via [place], and D/C home via private auto.  Dola Argyle

## 2016-07-31 NOTE — Plan of Care (Signed)
Problem: Bowel/Gastric: Goal: Will not experience complications related to bowel motility Outcome: Adequate for Discharge Scant bleeding noted on BR visit; passing flatus.

## 2016-07-31 NOTE — Progress Notes (Signed)
Inpatient Diabetes Program Recommendations  AACE/ADA: New Consensus Statement on Inpatient Glycemic Control (2015)  Target Ranges:  Prepandial:   less than 140 mg/dL      Peak postprandial:   less than 180 mg/dL (1-2 hours)      Critically ill patients:  140 - 180 mg/dL   Results for Alexis Farmer, Alexis Farmer (MRN [id number]) as of 07/31/2016 08:42  Ref. Range 07/30/2016 07:32 07/30/2016 11:38 07/30/2016 16:39 07/30/2016 21:23  Glucose-Capillary Latest Ref Range: 65 - 99 mg/dL 193 (H) 245 (H) 291 (H) 275 (H)   Results for Alexis Farmer, Alexis Farmer (MRN [id number]) as of 07/31/2016 08:42  Ref. Range 07/31/2016 07:30  Glucose-Capillary Latest Ref Range: 65 - 99 mg/dL 281 (H)    Admit Rectal Bleeding.   History: DM, CHF, CKD  Home DM Meds: NPH insulin 33 units AM/ 20 units PM                              Regular Insulin 15 units TID  Current Orders: Novolog Sensitive Correction Scale/ SSI (0-9 units) TID AC + HS      Lantus 25 units QHS     MD- Note Lantus 25 units QHS started last PM.  Fasting glucose still elevated this AM.  Ate 100% of her CL diet last PM.  Please consider the following insulin adjustments:  1. Increase Lantus to 40 units QHS (75% of home dose of basal insulin)  2. Start Novolog Meal Coverage: Novolog 4 units TID with meals (hold if pt eats <50% of meal)      --Will follow patient during hospitalization--  Wyn Quaker RN, MSN, CDE Diabetes Coordinator Inpatient Glycemic Control Team Team Pager: [phone] (8a-5p)

## 2016-07-31 NOTE — Discharge Instructions (Signed)
Heart healthy and ADA diet. °

## 2016-07-31 NOTE — Progress Notes (Signed)
Patient crying/upset earlier stating that she's upset that she's still bleeding and..."it doesn't seem like the GI doctor cares that I'm still bleeding...Marland Kitchenhe doesn't want to check me out to see where I'm bleeding from.Marland KitchenMarland KitchenI thought that he would at least look at my bottom..."  Emotional support given to patient. Encouraged continued Anusol cream use, agreed. Barbaraann Faster, RN 1:46 AM 07/31/2016

## 2016-08-01 NOTE — Telephone Encounter (Signed)
Transition Care Management Follow-up Telephone Call  How have you been since you were released from the hospital? Patient stated still bleeding when I have a bowel-movement., still having pain in lower mid-abdomen. Turns the water a pink color in toilet. Patient stated she is not happy with the DX from hospital , because no one looked at the rectal area. If still bleeding by Friday patient wants to be seen at an other hospital.     Do you understand why you were in the hospital? Yes, for rectal bleeding which was a lot and bright red.   Do you understand the discharge instrcutions? Yes, follow up PCP and Dr. Viona Gilmore  Items Reviewed:  Medications reviewed:  Yes, Patient given hydrocortisone (Anusol-HC) and D/C Aspirin .  Allergies reviewed: Yes  Dietary changes reviewed: No  Referrals reviewed: Yes   Functional Questionnaire:   Activities of Daily Living (ADLs):   She states they are independent in the following: Patient stated she feels weak , but can perform all ADL's. States they require assistance with the following:No assist needed.   Any transportation issues/concerns?: No.   Any patient concerns? Yes, where the bleeding is coming from.   Confirmed importance and date/time of follow-up visits scheduled: Yes,    Confirmed with patient if condition begins to worsen call PCP or go to the ER.  Patient was given the Call-a-Nurse line [phone]

## 2016-08-02 NOTE — Telephone Encounter (Signed)
Patient reports having light pink color in toilet twice since release when having Bowel movement. None reported today. Patient HFU is scheduled for 08/06/16.

## 2016-08-02 NOTE — Telephone Encounter (Signed)
Noted  

## 2016-08-02 NOTE — Telephone Encounter (Signed)
Please check with patient to make sure she is not still bleeding. If she is she should be reevaluated. Thanks.

## 2016-08-06 ENCOUNTER — Ambulatory Visit: Payer: Medicare Other | Admitting: Family Medicine

## 2016-08-06 ENCOUNTER — Encounter: Payer: Self-pay | Admitting: Family Medicine

## 2016-08-06 ENCOUNTER — Ambulatory Visit (INDEPENDENT_AMBULATORY_CARE_PROVIDER_SITE_OTHER): Payer: Medicare Other | Admitting: Family Medicine

## 2016-08-06 VITALS — BP 140/80 | HR 83 | Temp 97.9°F | Wt 278.8 lb

## 2016-08-06 DIAGNOSIS — K625 Hemorrhage of anus and rectum: Secondary | ICD-10-CM | POA: Diagnosis not present

## 2016-08-06 DIAGNOSIS — Z794 Long term (current) use of insulin: Secondary | ICD-10-CM

## 2016-08-06 DIAGNOSIS — R05 Cough: Secondary | ICD-10-CM | POA: Diagnosis not present

## 2016-08-06 DIAGNOSIS — E1165 Type 2 diabetes mellitus with hyperglycemia: Secondary | ICD-10-CM

## 2016-08-06 DIAGNOSIS — E1122 Type 2 diabetes mellitus with diabetic chronic kidney disease: Secondary | ICD-10-CM

## 2016-08-06 DIAGNOSIS — N183 Chronic kidney disease, stage 3 unspecified: Secondary | ICD-10-CM

## 2016-08-06 DIAGNOSIS — R059 Cough, unspecified: Secondary | ICD-10-CM

## 2016-08-06 DIAGNOSIS — I10 Essential (primary) hypertension: Secondary | ICD-10-CM

## 2016-08-06 DIAGNOSIS — IMO0002 Reserved for concepts with insufficient information to code with codable children: Secondary | ICD-10-CM

## 2016-08-06 LAB — CBC
HCT: 39.8 % (ref 35.0–45.0)
Hemoglobin: 13.1 g/dL (ref 11.7–15.5)
MCH: 29.2 pg (ref 27.0–33.0)
MCHC: 32.9 g/dL (ref 32.0–36.0)
MCV: 88.6 fL (ref 80.0–100.0)
MPV: 12.3 fL (ref 7.5–12.5)
PLATELETS: 239 10*3/uL (ref 140–400)
RBC: 4.49 MIL/uL (ref 3.80–5.10)
RDW: 14.3 % (ref 11.0–15.0)
WBC: 7.3 10*3/uL (ref 3.8–10.8)

## 2016-08-06 MED ORDER — INSULIN REGULAR HUMAN 100 UNIT/ML IJ SOLN: 15.0000 [IU] | Freq: Three times a day (TID) | INTRAMUSCULAR | 1 refills | Status: DC

## 2016-08-06 NOTE — Progress Notes (Signed)
  Tommi Rumps, MD Phone: [phone]  Alexis Farmer is a 76 y.o. female who presents today for hospital follow-up.  Patient was hospitalized for rectal bleeding. Patient was admitted from 07/29/16-07/31/16. She was evaluated by GI and was felt to be related to hemorrhoids. She stopped bleeding 3 days after discharge. Has not had any recurrent bleeding. Occasional cramps in her abdomen though no abdominal pain. Does feel as though there is a hemorrhoid rectally. No constipation. She was going 3-4 times a day prior to the rectal bleeding episode. Patient's medications were reviewed. Discharge summary was reviewed as well.  Notes her blood pressure was good in the hospital. She notes no chest pain or shortness of breath. She continues to take her blood pressure medications.  Additionally missed her PFTs while she was in the hospital. She is willing to reschedule these. She does note continued occasional cough.  Of note she reports she did not have a great experience at [facility]. She would prefer going to a different hospital in the future.  PMH: nonsmoker.   ROS see history of present illness  Objective  Physical Exam Vitals:   08/06/16 1127 08/06/16 1154  BP: (!) 166/78 140/80  Pulse: 83   Temp: 97.9 F (36.6 C)     BP Readings from Last 3 Encounters:  08/06/16 140/80  07/31/16 (!) 136/57  07/24/16 (!) 152/84   Wt Readings from Last 3 Encounters:  08/06/16 278 lb 12.8 oz (126.5 kg)  07/30/16 274 lb 8 oz (124.5 kg)  07/19/16 278 lb (126.1 kg)    Physical Exam  Constitutional: No distress.  Cardiovascular: Normal rate, regular rhythm and normal heart sounds.   Pulmonary/Chest: Effort normal and breath sounds normal.  Genitourinary:  Genitourinary Comments: Patient declined rectal exam  Musculoskeletal: She exhibits no edema.  Neurological: She is alert. Gait normal.  Skin: Skin is warm and dry. She is not diaphoretic.     Assessment/Plan: Please see  individual problem list.  Essential hypertension, benign Well-controlled in the hospital. Improved on recheck today. Discussed continuing to monitor her blood pressure periodically and continuing current medications.  Rectal bleed Patient with episode of rectal bleeding felt to be related to hemorrhoids. Evaluated by GI in the hospital. Notes no recurrent bleeding. Has been doing stool softeners and topical cream. She declined rectal exam today. She has a follow-up appointment already scheduled with GI. I encouraged her to keep this. Encouraged to monitor for recurrence of rectal bleeding. We'll check a CBC today.  Cough We will reschedule PFTs.   Orders Placed This Encounter  Procedures  . CBC    Meds ordered this encounter  Medications  . insulin regular (NOVOLIN R) 250 units/2.70mL (100 units/mL) injection    Sig: Inject 0.15 mLs (15 Units total) into the skin 3 (three) times daily before meals.    Dispense:  20 mL    Refill:  1    DX Code Needed  E11.65    Tommi Rumps, MD [place]

## 2016-08-06 NOTE — Assessment & Plan Note (Signed)
Well-controlled in the hospital. Improved on recheck today. Discussed continuing to monitor her blood pressure periodically and continuing current medications.

## 2016-08-06 NOTE — Assessment & Plan Note (Signed)
Patient with episode of rectal bleeding felt to be related to hemorrhoids. Evaluated by GI in the hospital. Notes no recurrent bleeding. Has been doing stool softeners and topical cream. She declined rectal exam today. She has a follow-up appointment already scheduled with GI. I encouraged her to keep this. Encouraged to monitor for recurrence of rectal bleeding. We'll check a CBC today.

## 2016-08-06 NOTE — Assessment & Plan Note (Signed)
We will reschedule PFTs.

## 2016-08-06 NOTE — Patient Instructions (Signed)
Nice to see you. We'll check some lab work today and contact you with the results. I would like you to reschedule your lung function tests and your visit with our pharmacist. You can schedule the appointment with the pharmacist on your way out. If you have recurrent bleeding please seek medical attention immediately.

## 2016-08-06 NOTE — Progress Notes (Signed)
Pre visit review using our clinic review tool, if applicable. No additional management support is needed unless otherwise documented below in the visit note. 

## 2016-08-07 ENCOUNTER — Telehealth: Payer: Self-pay | Admitting: *Deleted

## 2016-08-07 ENCOUNTER — Other Ambulatory Visit: Payer: Self-pay | Admitting: Family Medicine

## 2016-08-07 DIAGNOSIS — R062 Wheezing: Secondary | ICD-10-CM

## 2016-08-07 NOTE — Telephone Encounter (Signed)
Order placed

## 2016-08-07 NOTE — Telephone Encounter (Signed)
Dee from [facility] scheduling requested a order for PST e placed and signed. [employer] [phone]

## 2016-08-07 NOTE — Telephone Encounter (Signed)
notified

## 2016-08-07 NOTE — Telephone Encounter (Signed)
Please advise 

## 2016-08-10 ENCOUNTER — Other Ambulatory Visit: Payer: Self-pay | Admitting: Family Medicine

## 2016-08-10 NOTE — Telephone Encounter (Signed)
Last OV  08/06/16 last filled 03/12/16 1581 g 0rf

## 2016-08-13 ENCOUNTER — Encounter: Payer: Self-pay | Admitting: Podiatry

## 2016-08-13 ENCOUNTER — Ambulatory Visit (INDEPENDENT_AMBULATORY_CARE_PROVIDER_SITE_OTHER): Payer: Medicare Other | Admitting: Podiatry

## 2016-08-13 DIAGNOSIS — B351 Tinea unguium: Secondary | ICD-10-CM

## 2016-08-13 DIAGNOSIS — M79609 Pain in unspecified limb: Secondary | ICD-10-CM

## 2016-08-13 DIAGNOSIS — E0843 Diabetes mellitus due to underlying condition with diabetic autonomic (poly)neuropathy: Secondary | ICD-10-CM

## 2016-08-13 NOTE — Progress Notes (Signed)
Complaint:  Visit Type: Patient returns to my office for continued preventative foot care services. Complaint: Patient states" my nails have grown long and thick and become painful to walk and wear shoes" Patient has been diagnosed with DM with neuropathy.. The patient presents for preventative foot care services. No changes to ROS  Podiatric Exam: Vascular: dorsalis pedis and posterior tibial pulses are palpable bilateral. Capillary return is immediate. Temperature gradient is WNL. Skin turgor WNL  Sensorium: Diminished  Semmes Weinstein monofilament test. Normal tactile sensation bilaterally. Nail Exam: Pt has thick disfigured discolored nails with subungual debris noted bilateral entire nail hallux through fifth toenails Ulcer Exam: There is no evidence of ulcer or pre-ulcerative changes or infection. Orthopedic Exam: Muscle tone and strength are WNL. No limitations in general ROM. No crepitus or effusions noted. Foot type and digits show no abnormalities. Bony prominences are unremarkable. Skin: No Porokeratosis. No infection or ulcers  Diagnosis:  Onychomycosis, , Pain in right toe, pain in left toes  Treatment & Plan Procedures and Treatment: Consent by patient was obtained for treatment procedures. The patient understood the discussion of treatment and procedures well. All questions were answered thoroughly reviewed. Debridement of mycotic and hypertrophic toenails, 1 through 5 bilateral and clearing of subungual debris. No ulceration, no infection noted.  Return Visit-Office Procedure: Patient instructed to return to the office for a follow up visit 3 months for continued evaluation and treatment.    Gardiner Barefoot DPM

## 2016-08-16 ENCOUNTER — Telehealth: Payer: Self-pay

## 2016-08-16 ENCOUNTER — Ambulatory Visit: Payer: Medicare Other | Attending: Family Medicine

## 2016-08-16 DIAGNOSIS — R062 Wheezing: Secondary | ICD-10-CM | POA: Diagnosis present

## 2016-08-16 DIAGNOSIS — J984 Other disorders of lung: Secondary | ICD-10-CM

## 2016-08-16 NOTE — Telephone Encounter (Signed)
-----   Message from Leone Haven, MD sent at 08/16/2016  3:48 PM EST ----- Please let the patient know that her breathing test was unable to be completed due to her not being able to perform reproducible or acceptable spirometry. They recommended outpatient pulmonology consultation given possibility of restrictive lung disease. I would advise referral to pulmonology. If she is willing I can place a referral. Thanks.

## 2016-08-16 NOTE — Telephone Encounter (Signed)
Referral placed.

## 2016-08-16 NOTE — Telephone Encounter (Signed)
Patient notified and would like referral in [place]

## 2016-08-16 NOTE — Telephone Encounter (Signed)
Left message to return call 

## 2016-08-20 ENCOUNTER — Ambulatory Visit: Payer: Medicare Other | Admitting: Gastroenterology

## 2016-08-27 ENCOUNTER — Ambulatory Visit: Payer: Medicare Other | Admitting: Pharmacist

## 2016-08-28 ENCOUNTER — Ambulatory Visit: Payer: Medicare Other | Admitting: Gastroenterology

## 2016-08-31 ENCOUNTER — Encounter: Payer: Self-pay | Admitting: Family Medicine

## 2016-08-31 ENCOUNTER — Ambulatory Visit (INDEPENDENT_AMBULATORY_CARE_PROVIDER_SITE_OTHER): Payer: Medicare Other | Admitting: Family Medicine

## 2016-08-31 DIAGNOSIS — R05 Cough: Secondary | ICD-10-CM

## 2016-08-31 DIAGNOSIS — I6523 Occlusion and stenosis of bilateral carotid arteries: Secondary | ICD-10-CM

## 2016-08-31 DIAGNOSIS — F331 Major depressive disorder, recurrent, moderate: Secondary | ICD-10-CM

## 2016-08-31 DIAGNOSIS — I10 Essential (primary) hypertension: Secondary | ICD-10-CM

## 2016-08-31 DIAGNOSIS — R059 Cough, unspecified: Secondary | ICD-10-CM

## 2016-08-31 MED ORDER — SERTRALINE HCL 50 MG PO TABS: ORAL_TABLET | ORAL | 1 refills | Status: DC

## 2016-08-31 NOTE — Assessment & Plan Note (Signed)
Potentially with obstructive lung disease based on inability to complete PFTs. We have referred her to pulmonology.

## 2016-08-31 NOTE — Assessment & Plan Note (Signed)
Well-controlled at home. Slightly elevated today. Discussed continuing to monitor at home more frequently and if starts to be elevated at home could consider addition of other medications.

## 2016-08-31 NOTE — Patient Instructions (Signed)
Nice to see you. We are going to start you on Zoloft for depression. Please monitor your symptoms and if they worsen please let us know. If you develop thoughts of harming yourself or others please seek medical attention immediately. Please continue to monitor blood pressure. Please start checking this at least 4-5 times a week.

## 2016-08-31 NOTE — Progress Notes (Signed)
Pre visit review using our clinic review tool, if applicable. No additional management support is needed unless otherwise documented below in the visit note. 

## 2016-08-31 NOTE — Assessment & Plan Note (Signed)
Quite a bit worsened recently. Attempted to get a PHQ 9 score though she reported it was difficult to answer some of the questions as she only met criteria for certain aspects of each question. She did answer 3 for little interest or pleasure in doing things and feeling down. Also having trouble sleeping and feeling tired and having poor appetite. We will start her on Zoloft to help with her symptoms. I did offer referral for therapy though she declined. She'll monitor her symptoms and if they worsen she'll let us know.

## 2016-08-31 NOTE — Progress Notes (Signed)
  Tommi Rumps, MD Phone: [phone]  Alexis Farmer is a 76 y.o. female who presents today for follow-up.  Hypertension: Checking at home several times a week and it is typically in the 130s over 60s. She is currently on clonidine, spironolactone, torsemide, and valsartan. She notes no chest pain or shortness of breath. Minimal edema at times that is improved from previously. No orthopnea.  She does not have COPD on PFTs. Possibly with restrictive lung disease. She notes no trouble breathing currently. She has an appointment with pulmonology next month.  Depression and anxiety: Patient notes no anxiety. She notes the depression is a fair bit worse. She just wants to be alone. Does not want to be bothered. She's not sleeping well. Only getting about 3 hours a night. Wakes up a lot. No SI or HI. She does take Xanax to help her sleep though even this isn't helping very much. Nothing in her life has changed recently to cause these feelings.  PMH: nonsmoker.   ROS see history of present illness  Objective  Physical Exam Vitals:   08/31/16 0842 08/31/16 0909  BP: (!) 158/70 (!) 150/68  Pulse: 81   Temp: 98.5 F (36.9 C)     BP Readings from Last 3 Encounters:  08/31/16 (!) 150/68  08/06/16 140/80  07/31/16 (!) 136/57   Wt Readings from Last 3 Encounters:  08/31/16 279 lb 9.6 oz (126.8 kg)  08/06/16 278 lb 12.8 oz (126.5 kg)  07/30/16 274 lb 8 oz (124.5 kg)    Physical Exam  Constitutional: No distress.  Cardiovascular: Normal rate, regular rhythm and normal heart sounds.   Pulmonary/Chest: Effort normal and breath sounds normal.  Musculoskeletal: She exhibits no edema.  Neurological: She is alert. Gait normal.  Skin: Skin is warm and dry. She is not diaphoretic.  Psychiatric:  Mood depressed, affect flat and tearful     Assessment/Plan: Please see individual problem list.  Depression Quite a bit worsened recently. Attempted to get a PHQ 9 score though she  reported it was difficult to answer some of the questions as she only met criteria for certain aspects of each question. She did answer 3 for little interest or pleasure in doing things and feeling down. Also having trouble sleeping and feeling tired and having poor appetite. We will start her on Zoloft to help with her symptoms. I did offer referral for therapy though she declined. She'll monitor her symptoms and if they worsen she'll let us know.  Essential hypertension, benign Well-controlled at home. Slightly elevated today. Discussed continuing to monitor at home more frequently and if starts to be elevated at home could consider addition of other medications.  Cough Potentially with obstructive lung disease based on inability to complete PFTs. We have referred her to pulmonology.   No orders of the defined types were placed in this encounter.   Meds ordered this encounter  Medications  . sertraline (ZOLOFT) 50 MG tablet    Sig: 25 mg (half a tablet) by mouth daily for 7 days, then take 50 mg (one tablet) by mouth daily    Dispense:  90 tablet    Refill:  [place], MD [place]

## 2016-09-03 ENCOUNTER — Encounter: Payer: Self-pay | Admitting: Pharmacist

## 2016-09-03 ENCOUNTER — Ambulatory Visit (INDEPENDENT_AMBULATORY_CARE_PROVIDER_SITE_OTHER): Payer: Medicare Other | Admitting: Pharmacist

## 2016-09-03 DIAGNOSIS — I10 Essential (primary) hypertension: Secondary | ICD-10-CM | POA: Diagnosis not present

## 2016-09-03 DIAGNOSIS — I6523 Occlusion and stenosis of bilateral carotid arteries: Secondary | ICD-10-CM

## 2016-09-03 DIAGNOSIS — E1165 Type 2 diabetes mellitus with hyperglycemia: Secondary | ICD-10-CM

## 2016-09-03 DIAGNOSIS — N183 Chronic kidney disease, stage 3 unspecified: Secondary | ICD-10-CM

## 2016-09-03 DIAGNOSIS — Z794 Long term (current) use of insulin: Secondary | ICD-10-CM

## 2016-09-03 DIAGNOSIS — IMO0002 Reserved for concepts with insufficient information to code with codable children: Secondary | ICD-10-CM

## 2016-09-03 DIAGNOSIS — F331 Major depressive disorder, recurrent, moderate: Secondary | ICD-10-CM

## 2016-09-03 DIAGNOSIS — E1122 Type 2 diabetes mellitus with diabetic chronic kidney disease: Secondary | ICD-10-CM | POA: Diagnosis not present

## 2016-09-03 NOTE — Progress Notes (Signed)
S:     Chief Complaint  Patient presents with  . Medication Management    Diabetes   Patient arrives ambulating without assitance.  Presents for diabetes evaluation, education, and management at the request of Dr Caryl Bis. Patient was referred on 08/06/16.  Patient was last seen by Primary Care Provider on 08/31/16. Today she reports she has not started Sertraline due to risk of bleeding and her previous history of GIB.  She denies treatment with alternative medication with lower risk of GIB. Patient also reports fear of hypoglycemia due to her mother having a diabetic coma and she does not go to sleep with a CBG <200.   Patient reports Diabetes was diagnosed ~25 years ago (has been on insulin ~17 years).   Family/Social History: Mother/Sister Diabetes  Patient reports adherence with medications.  Current diabetes medications include: Insulin NPH 33 units QAM 20 units QPM, Insulin Regular 15 units TID before meals Current hypertension medications include: Spironolactone 25 mg daily, Torsemide 20 mg BID, Valsartan 320 mg daily  Patient reports previous hypoglycemic events 3-4 times per week (usually at night) with CBGs in high 60s. Reports symptoms of hand numbness. Patient states she has not had these episodes over the past 2-3 weeks and these were due to her being sick and not eating.   Patient reported dietary habits: Eats 2-3 meals/day Breakfast:egg, grits, toast or cereal or yeast roll Lunch:salad or [place] sandwich or fruit with cottage cheese Dinner:fish, vegetables, sweet potatoes Snacks:cookie or apple  Drinks:water, diet soda  Patient reported exercise habits: Walks around house and goes shopping    Patient reports nocturia 1-2x/night.  Patient reports neuropathy in feet with numbness on bottoms. Patient reports visual changes when blood sugars are high. Patient reports self foot exams. Denies changes. Has followup with podiatry on 3/27  Did not bring CBG meter to  clinic today.  Checks CBGs 3x/day Fasting CBG today 150 mg/dL. Reports fasting CBGs mostly high 100s or 200s.     O:  Physical Exam  Vitals reviewed.  Review of Systems  Constitutional: Negative.    Lab Results  Component Value Date   HGBA1C 9.2 (H) 07/18/2016   Vitals:   09/03/16 0954  BP: (!) 154/78  Pulse: 71    A/P: Diabetes longstanding diagnosed currently uncontrolled. Patient reports hypoglycemic events and is able to verbalize appropriate hypoglycemia management plan. Patient reports adherence with medication. Control is suboptimal due to insulin resistance, questionable adherence, use of NPH/regular insulin regimen and patient resistance to changes with medication regimen. Following discussion and approval by Dr Caryl Bis, the following medication changes were made:  -Patient very resistant to medication changes today and would like to trial lifestyle changes and improved adherence/timing of meals/insulin dosing -Continue Regular insulin 15 units three times daily before meals  -Continue NPH insulin 33 units in the morning and 20 units at night -Instructed to check blood glucose 3 to 4 times per day before meals and at bedtime  -Patient to followup closely within 2 weeks.  Will consider further insulin adjustment or addition of GLP1 at next visit -Next A1C anticipated 10/2016.    Hypertension longstanding diagnosed currently uncontrolled.  Patient reports adherence with medication. Control is suboptimal due to questionable adherence and patient resistance to medication changes during visit today. Will continue to monitor blood pressure at next visit.  Instructed patient to monitor home blood pressure  Depression: Patient has not started Sertraline due to risk of bleeding.  Patient denies further treatment at this  time.  Would consider Duloxetine despite its risk of bleeding due to concomitant treatment of depression and patient untreated diabetic neuropathy.   Written  patient instructions provided.  Total time in face to face counseling 55 minutes.   Follow up in Pharmacist Clinic Visit in 2 weeks.    Patient was seen with Dr Caryl Bis today in clinic and medication changes were discussed and approved prior to initiation

## 2016-09-03 NOTE — Assessment & Plan Note (Signed)
Hypertension longstanding diagnosed currently uncontrolled.  Patient reports adherence with medication. Control is suboptimal due to questionable adherence and patient resistance to medication changes during visit today. Will continue to monitor blood pressure at next visit.  Instructed patient to monitor home blood pressure

## 2016-09-03 NOTE — Patient Instructions (Addendum)
Continue Regular insulin 15 units three times daily before meals  Continue NPH insulin 33 units in the morning and 20 units at night  Check blood glucose 3 to 4 times per day before meals and at bedtime  Followup with Bennye Alm, PharmD in 2 weeks

## 2016-09-03 NOTE — Assessment & Plan Note (Signed)
Depression: Patient has not started Sertraline due to risk of bleeding.  Patient denies further treatment at this time.  Would consider Duloxetine despite its risk of bleeding due to concomitant treatment of depression and patient untreated diabetic neuropathy.

## 2016-09-03 NOTE — Assessment & Plan Note (Signed)
Diabetes longstanding diagnosed currently uncontrolled. Patient reports hypoglycemic events and is able to verbalize appropriate hypoglycemia management plan. Patient reports adherence with medication. Control is suboptimal due to insulin resistance, questionable adherence, use of NPH/regular insulin regimen and patient resistance to changes with medication regimen. Following discussion and approval by Dr Caryl Bis, the following medication changes were made:  -Patient very resistant to medication changes today and would like to trial lifestyle changes and improved adherence/timing of meals/insulin dosing -Continue Regular insulin 15 units three times daily before meals  -Continue NPH insulin 33 units in the morning and 20 units at night -Instructed to check blood glucose 3 to 4 times per day before meals and at bedtime  -Patient to followup closely within 2 weeks.  Will consider further insulin adjustment or addition of GLP1 at next visit -Next A1C anticipated 10/2016.

## 2016-09-05 NOTE — Progress Notes (Addendum)
I have reviewed the above note and agree. I saw the patient with the pharmacist in the office as well.  Tommi Rumps, M.D.

## 2016-09-06 ENCOUNTER — Telehealth: Payer: Self-pay | Admitting: Family Medicine

## 2016-09-06 NOTE — Telephone Encounter (Signed)
Pt called c/o rt ear pain and a sore throat that feels like it is splitting. Pt would like to know if Dr. Caryl Bis could just call something in for her. Please advise, thank you!  Call pt @ [phone]  Pharmacy - [id number] - [place], [place]

## 2016-09-06 NOTE — Telephone Encounter (Signed)
Agree that patient should be evaluated for this.

## 2016-09-06 NOTE — Telephone Encounter (Signed)
Reason for call: right ear pain X 3 days,  Symptoms:right ear pain, sore throat X 2 days, some sinus drainage,  Medications: Tylenol  Offered to schedule appointment but patient refused said she would call back

## 2016-09-06 NOTE — Telephone Encounter (Signed)
Left message to call.

## 2016-09-07 NOTE — Telephone Encounter (Signed)
Called patient to follow up and offered to scheduled appointment , she declined stated she had been using throat lozenges for sore throat..  I advised her if symptoms worsened need to seek treatment.

## 2016-09-07 NOTE — Telephone Encounter (Signed)
Noted and agree. 

## 2016-09-11 ENCOUNTER — Ambulatory Visit (INDEPENDENT_AMBULATORY_CARE_PROVIDER_SITE_OTHER): Payer: Medicare Other | Admitting: Sports Medicine

## 2016-09-11 ENCOUNTER — Ambulatory Visit (INDEPENDENT_AMBULATORY_CARE_PROVIDER_SITE_OTHER): Payer: Medicare Other

## 2016-09-11 DIAGNOSIS — M722 Plantar fascial fibromatosis: Secondary | ICD-10-CM | POA: Diagnosis not present

## 2016-09-11 DIAGNOSIS — E0843 Diabetes mellitus due to underlying condition with diabetic autonomic (poly)neuropathy: Secondary | ICD-10-CM

## 2016-09-11 DIAGNOSIS — M79672 Pain in left foot: Secondary | ICD-10-CM

## 2016-09-11 DIAGNOSIS — M79671 Pain in right foot: Secondary | ICD-10-CM

## 2016-09-11 DIAGNOSIS — M779 Enthesopathy, unspecified: Secondary | ICD-10-CM

## 2016-09-11 MED ORDER — TRIAMCINOLONE ACETONIDE 10 MG/ML IJ SUSP: 10.0000 mg | Freq: Once | INTRAMUSCULAR | Status: DC

## 2016-09-11 MED ORDER — METHYLPREDNISOLONE 4 MG PO TBPK: ORAL_TABLET | ORAL | 0 refills | Status: DC

## 2016-09-11 NOTE — Progress Notes (Signed)
Subjective: Alexis Farmer is a 76 y.o. Diabetic female patient presents to office with complaint of heel pain on the left and right x 3 weeks. Reports that she did good after last steroid shot by me last year. Denies any other pedal complaints.   Patient Active Problem List   Diagnosis Date Noted  . Rectal bleed 07/29/2016  . Carotid stenosis 07/19/2016  . Chronic venous insufficiency 07/19/2016  . Pain and swelling of lower leg 07/19/2016  . Bradycardia 07/17/2016  . Postmenopausal bleeding 07/17/2016  . Chronic diastolic CHF (congestive heart failure) ([place]) 05/31/2016  . Cough 01/25/2016  . Chronic fatigue 11/15/2015  . Depression 10/13/2015  . Osteoarthritis 10/13/2015  . Vertigo 10/13/2015  . Anxiety 09/13/2015  . BP (high blood pressure) 09/13/2015  . GI bleed 08/03/2015  . Hyperlipidemia 08/26/2014  . Essential hypertension, benign 04/08/2014  . Diabetes mellitus type II, uncontrolled ([place]) 04/08/2014  . CKD (chronic kidney disease) stage 3, GFR 30-59 ml/min 04/08/2014  . Pedal edema 04/08/2014  . Morbid obesity ([place]) 04/08/2014  . Lower back pain 04/08/2014  . Diabetic polyneuropathy ([place]) 04/08/2014    Current Outpatient Prescriptions on File Prior to Visit  Medication Sig Dispense Refill  . albuterol (PROVENTIL HFA;VENTOLIN HFA) 108 (90 Base) MCG/ACT inhaler Inhale 2 puffs into the lungs every 6 (six) hours as needed for wheezing or shortness of breath. 1 Inhaler 0  . ALPRAZolam (XANAX) 0.5 MG tablet Take 0.5 mg by mouth at bedtime as needed for anxiety or sleep.    . B-D INS SYRINGE 0.5CC/30GX1/2" 30G X 1/2" 0.5 ML MISC USE AS DIRECTED 500 each 2  . cloNIDine (CATAPRES) 0.2 MG tablet TAKE 1 TABLET (0.2 MG TOTAL) BY MOUTH 2 (TWO) TIMES DAILY. 180 tablet 3  . hydrocortisone (ANUSOL-HC) 2.5 % rectal cream Place rectally 2 (two) times daily. (Patient not taking: Reported on 09/03/2016) 28.35 g 1  . insulin NPH Human (NOVOLIN N) 100 UNIT/ML injection Inject 0.2-0.33 mLs  (20-33 Units total) into the skin 2 (two) times daily before a meal. Takes 33 units am and 20 units pm daily. 30 mL 3  . insulin regular (NOVOLIN R) 250 units/2.17mL (100 units/mL) injection Inject 0.15 mLs (15 Units total) into the skin 3 (three) times daily before meals. 20 mL 1  . Multiple Vitamin (MULTIVITAMIN WITH MINERALS) TABS tablet Take 1 tablet by mouth daily.    . polyethylene glycol powder (GLYCOLAX/MIRALAX) powder TAKE 17 G BY MOUTH DAILY. 1581 g 0  . rosuvastatin (CRESTOR) 10 MG tablet TAKE 1 TABLET (10 MG TOTAL) BY MOUTH DAILY. 90 tablet 3  . sertraline (ZOLOFT) 50 MG tablet 25 mg (half a tablet) by mouth daily for 7 days, then take 50 mg (one tablet) by mouth daily (Patient not taking: Reported on 09/03/2016) 90 tablet 1  . spironolactone (ALDACTONE) 25 MG tablet Take 1 tablet (25 mg total) by mouth daily. 90 tablet 3  . torsemide (DEMADEX) 20 MG tablet Take 1 tablet (20 mg total) by mouth 2 (two) times daily. 180 tablet 3  . valsartan (DIOVAN) 320 MG tablet Take 1 tablet (320 mg total) by mouth daily. 90 tablet 3   Current Facility-Administered Medications on File Prior to Visit  Medication Dose Route Frequency Provider Last Rate Last Dose  . betamethasone acetate-betamethasone sodium phosphate (CELESTONE) injection 3 mg  3 mg Intramuscular Once Edrick Kins, DPM      . triamcinolone acetonide (KENALOG) 10 MG/ML injection 10 mg  10 mg Other Once [name], DPM  Allergies  Allergen Reactions  . Penicillins Hives, Swelling and Other (See Comments)    Has patient had a PCN reaction causing immediate rash, facial/tongue/throat swelling, SOB or lightheadedness with hypotension: Yes Has patient had a PCN reaction causing severe rash involving mucus membranes or skin necrosis: No Has patient had a PCN reaction that required hospitalization No Has patient had a PCN reaction occurring within the last 10 years: Yes If all of the above answers are "NO", then may proceed with  Cephalosporin use.     Objective: Physical Exam General: The patient is alert and oriented x3 in no acute distress.  Dermatology: Skin is warm, dry and supple bilateral lower extremities. Nails 1-10 are short and thick, asymptomatic. There is no erythema, edema, no eccymosis, no open lesions present. Integument is otherwise unremarkable.  Vascular: Dorsalis Pedis pulse and Posterior Tibial pulse are 1/4 bilateral. Capillary fill time is immediate to all digits. Trace edema bilateral.   Neurological: Grossly intact to light touch with an achilles reflex of +2/5 and a negative Tinel's sign bilateral. Protective with SWMF intact. Vibratory diminished bilateral.   Musculoskeletal: Tenderness to palpation at the medial calcaneal tubercale and through the insertion of the plantar fascia on the left and right foot and at achilles insertion. No pain with compression of calcaneus bilateral. No pain with tuning fork to calcaneus bilateral. No pain with calf compression bilateral. There is decreased Ankle joint range of motion bilateral. All other joints range of motion within normal limits bilateral. Strength 5/5 in all groups bilateral. Pes planus foot type.   Xray, Left and right foot:  Normal osseous mineralization. Joint spaces preserved except midfoot. No fracture/dislocation/boney destruction. Calcaneal spur present with thickening of plantar fascia. Planus foot type. No other soft tissue abnormalities or radiopaque foreign bodies.   Assessment and Plan: Problem List Items Addressed This Visit    None    Visit Diagnoses    Plantar fasciitis    -  Primary   Relevant Medications   triamcinolone acetonide (KENALOG) 10 MG/ML injection 10 mg   methylPREDNISolone (MEDROL DOSEPAK) 4 MG TBPK tablet   Bilateral foot pain       Relevant Medications   triamcinolone acetonide (KENALOG) 10 MG/ML injection 10 mg   methylPREDNISolone (MEDROL DOSEPAK) 4 MG TBPK tablet   Other Relevant Orders   DG Foot 2  Views Right (Completed)   DG Foot 2 Views Left (Completed)   Diabetes mellitus due to underlying condition with diabetic autonomic neuropathy, unspecified long term insulin use status (HCC)       Relevant Medications   triamcinolone acetonide (KENALOG) 10 MG/ML injection 10 mg   methylPREDNISolone (MEDROL DOSEPAK) 4 MG TBPK tablet   Tendonitis       Relevant Medications   triamcinolone acetonide (KENALOG) 10 MG/ML injection 10 mg   methylPREDNISolone (MEDROL DOSEPAK) 4 MG TBPK tablet      -Complete examination performed.  -Xrays reviewed -Discussed with patient in detail the condition of plantar fasciitis and achilles tendonitis, how this occurs and general treatment options. Explained both conservative and surgical treatments.  -After oral consent and aseptic prep, injected a mixture containing 1 ml of 2% plain lidocaine, 1 ml 0.5% plain marcaine, 0.5 ml of kenalog 10 and 0.5 ml of dexamethasone phosphate into left and right heel at plantar fascia. Post-injection care discussed with patient.  -Rx Medrol dose pack. No NSAIDS due to CKD history. Advised to monitor sugar while on steroids and to adjust insulin  -Recommended good supportive  shoes daily -Explained and dispensed to patient daily stretching exercises and heel lifts. -Recommend patient to ice affected area 1-2x daily. -Patient to return to office in 4 weeks or as needed for follow up or sooner if problems or questions arise.  [name], DPM

## 2016-09-17 ENCOUNTER — Encounter: Payer: Self-pay | Admitting: Pharmacist

## 2016-09-17 ENCOUNTER — Ambulatory Visit (INDEPENDENT_AMBULATORY_CARE_PROVIDER_SITE_OTHER): Payer: Medicare Other | Admitting: Pharmacist

## 2016-09-17 DIAGNOSIS — Z794 Long term (current) use of insulin: Secondary | ICD-10-CM | POA: Diagnosis not present

## 2016-09-17 DIAGNOSIS — IMO0002 Reserved for concepts with insufficient information to code with codable children: Secondary | ICD-10-CM

## 2016-09-17 DIAGNOSIS — I6523 Occlusion and stenosis of bilateral carotid arteries: Secondary | ICD-10-CM

## 2016-09-17 DIAGNOSIS — E1122 Type 2 diabetes mellitus with diabetic chronic kidney disease: Secondary | ICD-10-CM | POA: Diagnosis not present

## 2016-09-17 DIAGNOSIS — I10 Essential (primary) hypertension: Secondary | ICD-10-CM | POA: Diagnosis not present

## 2016-09-17 DIAGNOSIS — F331 Major depressive disorder, recurrent, moderate: Secondary | ICD-10-CM | POA: Diagnosis not present

## 2016-09-17 DIAGNOSIS — N183 Chronic kidney disease, stage 3 unspecified: Secondary | ICD-10-CM

## 2016-09-17 DIAGNOSIS — E782 Mixed hyperlipidemia: Secondary | ICD-10-CM

## 2016-09-17 DIAGNOSIS — E1165 Type 2 diabetes mellitus with hyperglycemia: Secondary | ICD-10-CM

## 2016-09-17 NOTE — Assessment & Plan Note (Signed)
ASCVD risk greater than 7.5% with LDL (2015) of 76 mg/dL. Patient not currently on  Aspirin 81 mg due to recent rectal bleeding. Continued rosuvastatin 10 mg daily.

## 2016-09-17 NOTE — Assessment & Plan Note (Signed)
Hypertension longstanding diagnosed currently uncontrolled.  Patient reports adherence with medication. Control is suboptimal due to questionable adherence and patient refusal to initiate new medication.  Continue to monitor blood pressure and consider addition of amlodipine at next visit.

## 2016-09-17 NOTE — Assessment & Plan Note (Signed)
Depression: Currently uncontrolled. Patient has not started Sertraline due to risk of bleeding. Patient denies further treatment at this time including medications/counseling.  Patient was seen in clinic by Dr Caryl Bis today to discuss mood but also refused further intervention.

## 2016-09-17 NOTE — Assessment & Plan Note (Signed)
Diabetes longstanding diagnosed currently uncontrolled with improved CBGs per patient report. Patient denies hypoglycemic events and is able to verbalize appropriate hypoglycemia management plan. Patient reports adherence with medication. Control is suboptimal due to insulin resistance, questionable adherence, use of NPH/regular insulin regimen and patient resistance to changes with medication regimen. Following discussion and approval by Dr Caryl Bis, the following medication changes were made:  -Patient very resistant to medication changes.  Her CBGs were at/near goal per CBG log but questionable if these are true CBG readings based on previous A1C. Patient refuses initiation of other antidiabetic agents.  -Continue Regular insulin 15 units three times daily before meals  -Continue NPH insulin 33 units in the morning and 20 units at night -Instructed to checkblood glucose 3 to 4 times per day before meals and at bedtime  Next A1C anticipated 10/2016.

## 2016-09-17 NOTE — Progress Notes (Signed)
S:    Chief Complaint  Patient presents with  . Medication Management    Diabetes   Patient arrives ambulating with assistance of a cane but stating she is angry, tired and aggravated today.  Presents for diabetes evaluation, education, and management at the request of Dr Caryl Bis. Patient was referred on 08/06/16.  Patient was last seen by Primary Care Provider on 08/31/16 and last seen in pharmacy clinic on 09/03/16.   Patient reports she has been sick with cold like symptoms and recently finished a course of Azithromycin last week.  She reports she did not take the Medrol Dose pack which was prescribed by podiatry.  She also still refuses to initiate Sertraline which was prescribed by Dr Caryl Bis last month.  She denies other medications or counseling for depression.   Today she denies making lifestyle changes or changing the timing of her insulin since last visit.  She states this has been influenced by cold symptoms and she also is frustrated with being in clinic today.    Patient reports adherence with medications.  Current diabetes medications include: Novolin NPH 33 units QAM and 20 units QPM, Novolin R 15 units TID before meals Current hypertension medications include: Valsartan 320 mg daily, Torsemide 20 mg BID, Spironolactone 25 mg daily, Clonidine 0.2 mg BID  Patient reports hypoglycemic events with CBGs in 100s (treats episodes < 200 at night)  Patient reported dietary habits: Eats 2-3 meals/day.  Reports no dietary changes since last visit.    Patient reported exercise habits: walking and shopping.  Limited mobility.    Patient reports nocturia 1 time per night (has improved per patient).  Patient reports neuropathy on bottom of feet.  Saw podiatry 09/11/16.  Patient denies visual changes. Patient reports self foot exams.    O:  Physical Exam  Vitals reviewed.  Review of Systems  Constitutional: Negative for chills and fever.   Lab Results  Component Value Date    HGBA1C 9.2 (H) 07/18/2016   Lipid Panel     Component Value Date/Time   CHOL 165 03/11/2014   TRIG 182 (A) 03/11/2014   HDL 53 03/11/2014   LDLCALC 76 03/11/2014   Vitals:   09/17/16 0859  BP: (!) 169/75  Pulse: 71   CBG log 09/04/16-09/15/16:  Home fasting CBG: 140, 160, 180, 150, 133, 170, 100, 140, 160, 135, 145, 130 mg/dL  Before lunch CBG:175, 133, 150, 177, 160, 140, 190, 175, 133, 170, 180, 160 mg/dL Before dinner CBG: 190, 188, 189, 199, 140, 188, 199, 188, 180, 188, 140, 175 mg/dL Bedtime CBG: 177, 200, 188, 180, 190, 150, 160, 200, 175, 190, 166, 190 mg/dL   A/P: Diabetes longstanding diagnosed currently uncontrolled with improved CBGs per patient report. Patient denies hypoglycemic events and is able to verbalize appropriate hypoglycemia management plan. Patient reports adherence with medication. Control is suboptimal due to  insulin resistance, questionable adherence, use of NPH/regular insulin regimen and patient resistance to changes with medication regimen. Following discussion and approval by Dr Caryl Bis, the following medication changes were made:  -Patient very resistant to medication changes.  Her CBGs were at/near goal per CBG log but questionable if these are true CBG readings based on previous A1C. Patient refuses initiation of other antidiabetic agents.  -Continue Regular insulin 15 units three times daily before meals  -Continue NPH insulin 33 units in the morning and 20 units at night -Instructed to check blood glucose 3 to 4 times per day before meals and at  bedtime  Next A1C anticipated 10/2016.    ASCVD risk greater than 7.5% with LDL (2015) of 76 mg/dL. Patient not currently on  Aspirin 81 mg due to recent rectal bleeding. Continued rosuvastatin 10 mg daily.    Hypertension longstanding diagnosed currently uncontrolled.  Patient reports adherence with medication. Control is suboptimal due to questionable adherence and patient refusal to initiate new  medication.  Continue to monitor blood pressure and consider addition of amlodipine at next visit.   Depression: Currently uncontrolled. Patient has not started Sertraline due to risk of bleeding.  Patient denies further treatment at this time including medications/counseling.  Patient was seen in clinic by Dr Caryl Bis today to discuss mood but also refused further intervention.    Written patient instructions provided.  Total time in face to face counseling 45 minutes.   Follow up with Dr Caryl Bis in May.       Patient was seen with Dr Caryl Bis today in clinic and medication changes were discussed and approved prior to initiation

## 2016-09-17 NOTE — Patient Instructions (Signed)
Please continue current medications  Followup with Dr Caryl Bis in May

## 2016-09-21 ENCOUNTER — Telehealth: Payer: Self-pay | Admitting: *Deleted

## 2016-09-21 NOTE — Telephone Encounter (Signed)
Spoke with patient she states bleeding doesn't occur after urination only notices it when she wipes .  If bleeding increases she will go to ED for further evaluation. Patient advised if bleeding increases or pain increases she will go to ED .       Patient verbalized understanding.

## 2016-09-21 NOTE — Telephone Encounter (Signed)
Reason for call: vaginal bleeding  Symptoms: no dysuria, no back pack pain, no fever, no chills, cough,  bleeding occurs when  She wipes, dark stain size of nickel ,feels like  cramps when getting ready to get period, Patient states cramping at bottom of stomach pressure.   She states she is always in pain.   Has pain due to recent hemorrhoids,  recent biopsy of uterus,  Duration since yesterday  Dr Migdalia Dk did biopsy 5 months ago , no cancer, scheduled for another test 11/21/15, however they're going to move up appointment due to problems She has already contacted Dr Migdalia Dk office advised if bleeding increased need to go to ED to be evaluated  She is scheduled 10/03/15 for sooner evaluation .   Patient just wanted your advice.

## 2016-09-21 NOTE — Telephone Encounter (Signed)
Patient has requested a call to discuss her having a bladder infection. She has a small amount of a bleeding when she wipes. She also has pain on her right side. She has no other symptoms to report .  Pt contact [phone]

## 2016-09-21 NOTE — Telephone Encounter (Signed)
Please find  out if the bleeding occurs after urination only or if it occurs at other times. It sounds as though it is vaginal bleeding and not related to urination though I just want to make sure. If it occurs after urination I would suggest evaluation in the office or walkin clinic for this to ensure she does not have a UTI. If it occurs at other times I agree with the gynecologist office that if it increases she should be evaluated though if it does not increase she can follow up with them as scheduled. also if pain worsens she should be evaluated as well.

## 2016-09-23 ENCOUNTER — Emergency Department
Admission: EM | Admit: 2016-09-23 | Discharge: 2016-09-23 | Disposition: A | Discharge status: Home / Self Care | Payer: Medicare Other | Attending: Emergency Medicine | Admitting: Emergency Medicine

## 2016-09-23 ENCOUNTER — Encounter: Payer: Self-pay | Admitting: Emergency Medicine

## 2016-09-23 ENCOUNTER — Other Ambulatory Visit: Payer: Self-pay | Admitting: Family Medicine

## 2016-09-23 DIAGNOSIS — N183 Chronic kidney disease, stage 3 unspecified: Secondary | ICD-10-CM

## 2016-09-23 DIAGNOSIS — E1122 Type 2 diabetes mellitus with diabetic chronic kidney disease: Secondary | ICD-10-CM

## 2016-09-23 DIAGNOSIS — Z794 Long term (current) use of insulin: Principal | ICD-10-CM

## 2016-09-23 DIAGNOSIS — Z79899 Other long term (current) drug therapy: Secondary | ICD-10-CM | POA: Insufficient documentation

## 2016-09-23 DIAGNOSIS — I130 Hypertensive heart and chronic kidney disease with heart failure and stage 1 through stage 4 chronic kidney disease, or unspecified chronic kidney disease: Secondary | ICD-10-CM | POA: Insufficient documentation

## 2016-09-23 DIAGNOSIS — IMO0002 Reserved for concepts with insufficient information to code with codable children: Secondary | ICD-10-CM

## 2016-09-23 DIAGNOSIS — N390 Urinary tract infection, site not specified: Secondary | ICD-10-CM

## 2016-09-23 DIAGNOSIS — I5032 Chronic diastolic (congestive) heart failure: Secondary | ICD-10-CM | POA: Diagnosis not present

## 2016-09-23 DIAGNOSIS — R1030 Lower abdominal pain, unspecified: Secondary | ICD-10-CM | POA: Diagnosis present

## 2016-09-23 DIAGNOSIS — E1165 Type 2 diabetes mellitus with hyperglycemia: Principal | ICD-10-CM

## 2016-09-23 DIAGNOSIS — R319 Hematuria, unspecified: Secondary | ICD-10-CM

## 2016-09-23 LAB — URINALYSIS, COMPLETE (UACMP) WITH MICROSCOPIC
Bacteria, UA: NONE SEEN
Bilirubin Urine: NEGATIVE
Ketones, ur: NEGATIVE mg/dL
Leukocytes, UA: NEGATIVE
Nitrite: NEGATIVE
PH: 6 (ref 5.0–8.0)
PROTEIN: 100 mg/dL — AB
SPECIFIC GRAVITY, URINE: 1.012 (ref 1.005–1.030)

## 2016-09-23 LAB — GLUCOSE, CAPILLARY: Glucose-Capillary: 244 mg/dL — ABNORMAL HIGH (ref 65–99)

## 2016-09-23 MED ORDER — CEPHALEXIN 500 MG PO CAPS: 500.0000 mg | ORAL_CAPSULE | Freq: Three times a day (TID) | ORAL | 0 refills | Status: DC

## 2016-09-23 MED ORDER — CEPHALEXIN 500 MG PO CAPS
500.0000 mg | ORAL_CAPSULE | Freq: Once | ORAL | Status: AC
  Administered 2016-09-23: 500 mg via ORAL
  Filled 2016-09-23: qty 1

## 2016-09-23 NOTE — ED Triage Notes (Signed)
Pt presents to ED c/o hematuria and vaginal/bladder pain x5 days. Pt states it feel like pressure

## 2016-09-23 NOTE — ED Provider Notes (Signed)
[facility] Emergency Department Provider Note  ____________________________________________   First MD Initiated Contact with Patient 09/23/16 1624     (approximate)  I have reviewed the triage vital signs and the nursing notes.   HISTORY  Chief Complaint Hematuria and Cystitis   HPI Alexis Farmer is a 76 y.o. female with a history of UTI as well as endometrial hyperplasia who is presenting to the emergency department today with lower abdominal pain as well as urinary frequency and urgency. She says the symptoms have been ongoing for the past several days and is also having some mild lower to mid back pain bilaterally. Denies any fever. Is currently undergoing a workup for her endometrial hyperplasia and has a biopsy which did not show cancer but there is suspicion of a possible polyp. The patient is denying any blood from her vagina right now but says that she does have some spotting after she urinates.   Past Medical History:  Diagnosis Date  . Arthritis   . Chickenpox   . Diabetes mellitus without complication ([place])   . Diverticulitis   . GI bleed   . High cholesterol   . History of blood transfusion   . Hypertension   . Renal insufficiency     Patient Active Problem List   Diagnosis Date Noted  . Rectal bleed 07/29/2016  . Carotid stenosis 07/19/2016  . Chronic venous insufficiency 07/19/2016  . Pain and swelling of lower leg 07/19/2016  . Bradycardia 07/17/2016  . Postmenopausal bleeding 07/17/2016  . Chronic diastolic CHF (congestive heart failure) ([place]) 05/31/2016  . Cough 01/25/2016  . Chronic fatigue 11/15/2015  . Depression 10/13/2015  . Osteoarthritis 10/13/2015  . Vertigo 10/13/2015  . Anxiety 09/13/2015  . BP (high blood pressure) 09/13/2015  . GI bleed 08/03/2015  . Hyperlipidemia 08/26/2014  . Essential hypertension, benign 04/08/2014  . Diabetes mellitus type II, uncontrolled ([place]) 04/08/2014  . CKD (chronic kidney  disease) stage 3, GFR 30-59 ml/min 04/08/2014  . Pedal edema 04/08/2014  . Morbid obesity ([place]) 04/08/2014  . Lower back pain 04/08/2014  . Diabetic polyneuropathy ([place]) 04/08/2014    Past Surgical History:  Procedure Laterality Date  . APPENDECTOMY    . ECTOPIC PREGNANCY SURGERY    . gallbladder     . THYROIDECTOMY, PARTIAL      Prior to Admission medications   Medication Sig Start Date End Date Taking? Authorizing Provider  albuterol (PROVENTIL HFA;VENTOLIN HFA) 108 (90 Base) MCG/ACT inhaler Inhale 2 puffs into the lungs every 6 (six) hours as needed for wheezing or shortness of breath. Patient not taking: Reported on 09/17/2016 07/17/16   Leone Haven, MD  ALPRAZolam Duanne Moron) 0.5 MG tablet Take 0.5 mg by mouth at bedtime as needed for anxiety or sleep.    Historical Provider, MD  B-D INS SYRINGE 0.5CC/30GX1/2" 30G X 1/2" 0.5 ML MISC USE AS DIRECTED 11/16/15   Leone Haven, MD  cloNIDine (CATAPRES) 0.2 MG tablet TAKE 1 TABLET (0.2 MG TOTAL) BY MOUTH 2 (TWO) TIMES DAILY. 02/03/16   Minna Merritts, MD  insulin NPH Human (NOVOLIN N) 100 UNIT/ML injection Inject 0.2-0.33 mLs (20-33 Units total) into the skin 2 (two) times daily before a meal. Takes 33 units am and 20 units pm daily. 03/07/16   Leone Haven, MD  insulin regular (NOVOLIN R) 250 units/2.63mL (100 units/mL) injection Inject 0.15 mLs (15 Units total) into the skin 3 (three) times daily before meals. 08/06/16   Leone Haven, MD  Multiple Vitamin (MULTIVITAMIN WITH MINERALS) TABS tablet Take 1 tablet by mouth daily.    Historical Provider, MD  polyethylene glycol powder (GLYCOLAX/MIRALAX) powder TAKE 17 G BY MOUTH DAILY. 08/10/16   Leone Haven, MD  rosuvastatin (CRESTOR) 10 MG tablet TAKE 1 TABLET (10 MG TOTAL) BY MOUTH DAILY. 11/15/15   Leone Haven, MD  spironolactone (ALDACTONE) 25 MG tablet Take 1 tablet (25 mg total) by mouth daily. 11/15/15   Leone Haven, MD  torsemide (DEMADEX) 20 MG tablet Take 1  tablet (20 mg total) by mouth 2 (two) times daily. 05/31/16   Minna Merritts, MD  valsartan (DIOVAN) 320 MG tablet Take 1 tablet (320 mg total) by mouth daily. 11/15/15   Leone Haven, MD    Allergies Penicillins  Family History  Problem Relation Age of Onset  . Diabetes Mother   . Hypertension Mother   . Stroke Mother   . Diabetes Other   . Diabetes Sister   . Heart disease Sister     Social History Social History  Substance Use Topics  . Smoking status: Never Smoker  . Smokeless tobacco: Never Used  . Alcohol use No    Review of Systems Constitutional: No fever/chills Eyes: No visual changes. ENT: No sore throat. Cardiovascular: Denies chest pain. Respiratory: Denies shortness of breath. Gastrointestinal:  No nausea, no vomiting.  No diarrhea.  No constipation. Genitourinary: as above Musculoskeletal: as above Skin: Negative for rash. Neurological: Negative for headaches, focal weakness or numbness.  10-point ROS otherwise negative.  ____________________________________________   PHYSICAL EXAM:  VITAL SIGNS: ED Triage Vitals  Enc Vitals Group     BP 09/23/16 1457 (!) 169/66     Pulse Rate 09/23/16 1457 97     Resp 09/23/16 1457 19     Temp 09/23/16 1457 99.3 F (37.4 C)     Temp Source 09/23/16 1457 Oral     SpO2 09/23/16 1457 97 %     Weight 09/23/16 1458 279 lb (126.6 kg)     Height 09/23/16 1458 5\' 3"  (1.6 m)     Head Circumference --      Peak Flow --      Pain Score --      Pain Loc --      Pain Edu? --      Excl. in [place]? --     Constitutional: Alert and oriented. Well appearing and in no acute distress. Eyes: Conjunctivae are normal. PERRL. EOMI. Head: Atraumatic. Nose: No congestion/rhinnorhea. Mouth/Throat: Mucous membranes are moist.  Neck: No stridor.   Cardiovascular: Normal rate, regular rhythm. Grossly normal heart sounds.  Good peripheral circulation. Respiratory: Normal respiratory effort.  No retractions. Lungs  CTAB. Gastrointestinal: Soft with mild to moderate suprapubic ttp. No distention. No abdominal bruits. Mild cva ttp bilat.  Musculoskeletal: No lower extremity tenderness nor edema.  No joint effusions. Neurologic:  Normal speech and language. No gross focal neurologic deficits are appreciated.  Skin:  Skin is warm, dry and intact. No rash noted. Psychiatric: Mood and affect are normal. Speech and behavior are normal.  ____________________________________________   LABS (all labs ordered are listed, but only abnormal results are displayed)  Labs Reviewed  URINALYSIS, COMPLETE (UACMP) WITH MICROSCOPIC - Abnormal; Notable for the following:       Result Value   Color, Urine PINK (*)    APPearance HAZY (*)    Glucose, UA >=500 (*)    Hgb urine dipstick LARGE (*)    Protein,  ur 100 (*)    Squamous Epithelial / LPF 0-5 (*)    All other components within normal limits   ____________________________________________  EKG   ____________________________________________  RADIOLOGY   ____________________________________________   PROCEDURES  Procedure(s) performed:   Procedures  Critical Care performed:   ____________________________________________   INITIAL IMPRESSION / ASSESSMENT AND PLAN / ED COURSE  Pertinent labs & imaging results that were available during my care of the patient were reviewed by me and considered in my medical decision making (see chart for details).  Patient with symptoms that are clinically consistent with UTI as well as white blood cells and blood in the urine. We will treat with Keflex. Plan on sending culture and having the patient follow up with her primary care doctor. Patient is nontoxic in appearance and without a fever. Possible mild pyelonephritis because of the bilateral CVA tenderness palpation. However, the patient does not appear to be exhibiting any other systemic symptoms.     ____________________________________________   FINAL  CLINICAL IMPRESSION(S) / ED DIAGNOSES  UTI.     NEW MEDICATIONS STARTED DURING THIS VISIT:  New Prescriptions   No medications on file     Note:  This document was prepared using Dragon voice recognition software and may include unintentional dictation errors.    Orbie Pyo, MD 09/23/16 1726

## 2016-09-24 ENCOUNTER — Encounter: Payer: Self-pay | Admitting: Family Medicine

## 2016-09-24 ENCOUNTER — Ambulatory Visit (INDEPENDENT_AMBULATORY_CARE_PROVIDER_SITE_OTHER): Payer: Medicare Other | Admitting: Family Medicine

## 2016-09-24 DIAGNOSIS — R05 Cough: Secondary | ICD-10-CM

## 2016-09-24 DIAGNOSIS — I10 Essential (primary) hypertension: Secondary | ICD-10-CM

## 2016-09-24 DIAGNOSIS — N3000 Acute cystitis without hematuria: Secondary | ICD-10-CM | POA: Diagnosis not present

## 2016-09-24 DIAGNOSIS — N390 Urinary tract infection, site not specified: Secondary | ICD-10-CM | POA: Insufficient documentation

## 2016-09-24 DIAGNOSIS — R059 Cough, unspecified: Secondary | ICD-10-CM

## 2016-09-24 DIAGNOSIS — I6523 Occlusion and stenosis of bilateral carotid arteries: Secondary | ICD-10-CM

## 2016-09-24 MED ORDER — FLUTICASONE PROPIONATE 50 MCG/ACT NA SUSP: 2.0000 | Freq: Every day | NASAL | 6 refills | Status: DC

## 2016-09-24 NOTE — Progress Notes (Signed)
Pre visit review using our clinic review tool, if applicable. No additional management support is needed unless otherwise documented below in the visit note. 

## 2016-09-24 NOTE — Assessment & Plan Note (Signed)
Discussed additional medications for this though she declines. Encouraged her to check her blood pressure at the pharmacy.

## 2016-09-24 NOTE — Assessment & Plan Note (Addendum)
Continues to have issues with this. PFTs suggested restrictive disease. Does have some mild wheezing on exam today. Encouraged use of her albuterol inhaler if needed. Trial Flonase. She'll keep her appointment with pulmonology next week. Given return precautions.

## 2016-09-24 NOTE — Assessment & Plan Note (Signed)
Symptoms likely related to UTI. Already on Keflex through the emergency room. We will await urine culture results.

## 2016-09-24 NOTE — Patient Instructions (Addendum)
Nice to see you. Please finish the course of antibiotics and we will contact you with your urine culture results. We will try you on Flonase to see if this will help with cough. You can use the albuterol inhaler as needed for cough and wheezing. You should take your torsemide. We will get you to see the pulmonologist as scheduled later this month. He developed cough productive of blood or shortness of breath please seek medical attention immediately.

## 2016-09-24 NOTE — Progress Notes (Signed)
  Alexis Rumps, MD Phone: [phone]  Alexis Farmer is a 76 y.o. female who presents today for follow-up.  UTI: Patient was seen in the emergency room yesterday and diagnosed with UTI. She noted urinary frequency, urgency, and lower abdominal discomfort. She had blood and leukocytes in her urine. She notes no dysuria. No fevers. She did note some hematuria. No bleeding other than when she is urinating. No vaginal discharge. Notes the discomfort is improved after a dose of antibiotics.  Patient notes chronic cough. Sometimes produces phlegm. Has not worsened. Occasionally blows some mucus out of her nose. No sore throat. Denies reflux symptoms. No sinus congestion. Notes she has intermittent wheezing as well. Reports having an evaluation several years ago for reflux that did not reveal any reflux. She does have chronic diastolic heart failure and does take torsemide though did not take it today. No shortness of breath. No weight gain. She had PFTs that revealed possible restrictive lung disease.  Hypertension: Does not check at home. No chest pain or shortness of breath. She is taking clonidine, spironolactone, torsemide, and valsartan.  PMH: nonsmoker.   ROS see history of present illness  Objective  Physical Exam Vitals:   09/24/16 0943  BP: (!) 154/84  Pulse: 82  Temp: 98.1 F (36.7 C)    BP Readings from Last 3 Encounters:  09/24/16 (!) 154/84  09/23/16 (!) 185/79  09/17/16 (!) 169/75   Wt Readings from Last 3 Encounters:  09/24/16 273 lb 12.8 oz (124.2 kg)  09/23/16 279 lb (126.6 kg)  09/17/16 276 lb 12.8 oz (125.6 kg)    Physical Exam  Constitutional: No distress.  Cardiovascular: Normal rate, regular rhythm and normal heart sounds.   Pulmonary/Chest: Effort normal. No respiratory distress. She has wheezes (minimal scattered expiratory). She has no rales.  Abdominal: Soft. Bowel sounds are normal. She exhibits no distension. There is no tenderness. There is no  rebound and no guarding.  Musculoskeletal: She exhibits no edema.  Neurological: She is alert. Gait normal.  Skin: Skin is warm and dry. She is not diaphoretic.     Assessment/Plan: Please see individual problem list.  Essential hypertension, benign Discussed additional medications for this though she declines. Encouraged her to check her blood pressure at the pharmacy.  Cough Continues to have issues with this. PFTs suggested restrictive disease. Does have some mild wheezing on exam today. Encouraged use of her albuterol inhaler if needed. Trial Flonase. She'll keep her appointment with pulmonology next week. Given return precautions.  UTI (urinary tract infection) Symptoms likely related to UTI. Already on Keflex through the emergency room. We will await urine culture results.   No orders of the defined types were placed in this encounter.   Meds ordered this encounter  Medications  . fluticasone (FLONASE) 50 MCG/ACT nasal spray    Sig: Place 2 sprays into both nostrils daily.    Dispense:  16 g    Refill:  [name], MD [place]

## 2016-09-25 LAB — URINE CULTURE

## 2016-09-26 ENCOUNTER — Telehealth: Payer: Self-pay | Admitting: *Deleted

## 2016-09-26 NOTE — Telephone Encounter (Signed)
Patients urine grew out GBS which is a bacteria that can cause UTIs. This should be responsive to the medication she was started on. Please see if her symptoms have been improving. If not we may need to change medications. Thanks.

## 2016-09-26 NOTE — Telephone Encounter (Signed)
Patient notified, she states her R side is still aching and she is still having bleeding

## 2016-09-26 NOTE — Telephone Encounter (Signed)
Please advise 

## 2016-09-26 NOTE — Telephone Encounter (Signed)
Patient requested lab results  Pt contact [phone]

## 2016-09-27 NOTE — Telephone Encounter (Signed)
Discussed with CMA. She stated the patients symptoms were improving other than the bleeding and aching. I would have the patient finish the antibiotic given some improvement. I would like for her to provide [place] with another urine sample to check for blood to help guide work up. She may also need follow-up with GYN to ensure this is not vaginal bleeding. Thanks.

## 2016-09-27 NOTE — Telephone Encounter (Signed)
Left message to return call 

## 2016-09-28 NOTE — Telephone Encounter (Signed)
Patient has an appointment Monday and would like to do the urine sample at that time. She states she has not been bleeding today but is still having the discomfort

## 2016-09-28 NOTE — Telephone Encounter (Signed)
noted 

## 2016-09-28 NOTE — Telephone Encounter (Signed)
Noted. As long as her symptoms are continuing to improve she should monitor and will recheck next week.

## 2016-10-01 ENCOUNTER — Ambulatory Visit (INDEPENDENT_AMBULATORY_CARE_PROVIDER_SITE_OTHER): Payer: Medicare Other | Admitting: Internal Medicine

## 2016-10-01 ENCOUNTER — Other Ambulatory Visit (INDEPENDENT_AMBULATORY_CARE_PROVIDER_SITE_OTHER): Payer: Medicare Other

## 2016-10-01 ENCOUNTER — Telehealth: Payer: Self-pay | Admitting: *Deleted

## 2016-10-01 ENCOUNTER — Ambulatory Visit
Admission: RE | Admit: 2016-10-01 | Discharge: 2016-10-01 | Disposition: A | Discharge status: Home / Self Care | Payer: Medicare Other | Source: Ambulatory Visit | Attending: Internal Medicine | Admitting: Internal Medicine

## 2016-10-01 ENCOUNTER — Encounter: Payer: Self-pay | Admitting: Internal Medicine

## 2016-10-01 VITALS — BP 138/68 | HR 100 | Wt 278.0 lb

## 2016-10-01 DIAGNOSIS — N390 Urinary tract infection, site not specified: Secondary | ICD-10-CM | POA: Diagnosis not present

## 2016-10-01 DIAGNOSIS — J4520 Mild intermittent asthma, uncomplicated: Secondary | ICD-10-CM | POA: Diagnosis not present

## 2016-10-01 DIAGNOSIS — R0602 Shortness of breath: Secondary | ICD-10-CM | POA: Diagnosis present

## 2016-10-01 DIAGNOSIS — I6523 Occlusion and stenosis of bilateral carotid arteries: Secondary | ICD-10-CM

## 2016-10-01 DIAGNOSIS — R918 Other nonspecific abnormal finding of lung field: Secondary | ICD-10-CM | POA: Insufficient documentation

## 2016-10-01 LAB — POCT URINALYSIS DIPSTICK
BILIRUBIN UA: NEGATIVE
Glucose, UA: NEGATIVE
Ketones, UA: NEGATIVE
NITRITE UA: NEGATIVE
PH UA: 7.5 (ref 5.0–8.0)
PROTEIN UA: NEGATIVE
Spec Grav, UA: 1.01 (ref 1.010–1.025)
UROBILINOGEN UA: 0.2 U/dL

## 2016-10-01 LAB — URINALYSIS, MICROSCOPIC ONLY
RBC / HPF: NONE SEEN (ref 0–?)
WBC UA: NONE SEEN (ref 0–?)

## 2016-10-01 MED ORDER — FLUTICASONE PROPIONATE HFA 110 MCG/ACT IN AERO: 2.0000 | INHALATION_SPRAY | Freq: Two times a day (BID) | RESPIRATORY_TRACT | 6 refills | Status: DC

## 2016-10-01 NOTE — Patient Instructions (Addendum)
Continue Albuterol as needed Start FLovent HFA 110 MCG 2 puffs twice daily  Follow up Cardiology and Nephrology as needed

## 2016-10-01 NOTE — Telephone Encounter (Signed)
Order placed

## 2016-10-01 NOTE — Telephone Encounter (Signed)
Noted. Vitals appear normal. Should await results of CXR.

## 2016-10-01 NOTE — Telephone Encounter (Signed)
Patient reported a blood pressure reading of 138/68 pulse 100 and a oxygen level of 97%.  Pt also has a Xray this morning ordered by Dr. Milton Ferguson . Pt contact [phone]

## 2016-10-01 NOTE — Telephone Encounter (Signed)
Please advise 

## 2016-10-01 NOTE — Progress Notes (Signed)
[facility] Consultation      Date: 10/01/2016,   MRN# [id number] Alexis Farmer 12-11-1940 Code Status:  Code Status History    Date Active Date Inactive Code Status Order ID Comments User Context   07/30/2016  1:06 AM 07/31/2016  8:56 PM Full Code [id number]  Lance Coon, MD Inpatient   08/03/2015  6:56 AM 08/04/2015  9:30 PM Full Code [id number]  Harrie Foreman, MD Inpatient     Hosp day:@LENGTHOFSTAYDAYS @ Referring MD: @ATDPROV @     PCP:      AdmissionWeight: 278 lb (126.1 kg)                 CurrentWeight: 278 lb (126.1 kg) Alexis Farmer is a 76 y.o. old female seen in consultation for SOB at the request of Dr. Solon August[place].     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 Rockey Situ for HTN  And Dr. Singh(Nephrology for kidney disease) Patient states that she has been assess for OSA 2 years and was told she does not have OSA pa Shpe has no signs of inf  PAST MEDICAL HISTORY   Past Medical History:  Diagnosis Date  . Arthritis   . Chickenpox   . Diabetes mellitus without complication ([place])   . Diverticulitis   . GI bleed   . High cholesterol   . History of blood transfusion   . Hypertension   . Renal insufficiency      SURGICAL HISTORY   Past Surgical History:  Procedure Laterality Date  . APPENDECTOMY    . ECTOPIC PREGNANCY  SURGERY    . gallbladder     . THYROIDECTOMY, PARTIAL       FAMILY HISTORY   Family History  Problem Relation Age of Onset  . Diabetes Mother   . Hypertension Mother   . Stroke Mother   . Diabetes Other   . Diabetes Sister   . Heart disease Sister      SOCIAL HISTORY   Social History  Substance Use Topics  . Smoking status: Never Smoker  . Smokeless tobacco: Never Used  . Alcohol use No     MEDICATIONS    Home Medication:  Current Outpatient Rx  . Order #: [id number] Class: Normal  . Order #: [id number] Class: Historical Med  . Order #: [id number] Class: Normal  . Order #: [id number] Class: Print  . Order #: [id number] Class: Normal  . Order #: [id number] Class: Normal  . Order #: [id number] Class: Normal  . Order #: [id number] Class: Normal  . Order #: [id number] Class: Historical Med  . Order #: [id number] Class: Normal  . Order #: [id number] Class: Normal  . Order #: [id number] Class: Normal  . Order #: [id number] Class: Normal  . Order #: [id number] Class: Normal  . Order #: [id number] Class: Normal    Current Medication:  Current Outpatient Prescriptions:  .  albuterol (PROVENTIL HFA;VENTOLIN HFA) 108 (90 Base) MCG/ACT  inhaler, Inhale 2 puffs into the lungs every 6 (six) hours as needed for wheezing or shortness of breath., Disp: 1 Inhaler, Rfl: 0 .  ALPRAZolam (XANAX) 0.5 MG tablet, Take 0.5 mg by mouth at bedtime as needed for anxiety or sleep., Disp: , Rfl:  .  B-D INS SYRINGE 0.5CC/30GX1/2" 30G X 1/2" 0.5 ML MISC, USE AS DIRECTED, Disp: 500 each, Rfl: 2 .  cephALEXin (KEFLEX) 500 MG capsule, Take 1 capsule (500 mg total) by mouth 3 (three) times daily., Disp: 21 capsule, Rfl: 0 .  cloNIDine (CATAPRES) 0.2 MG tablet, TAKE 1 TABLET (0.2 MG TOTAL) BY MOUTH 2 (TWO) TIMES DAILY., Disp: 180 tablet, Rfl: 3 .  fluticasone (FLONASE) 50 MCG/ACT nasal spray, Place 2 sprays into both nostrils daily., Disp: 16 g, Rfl: 6 .  insulin NPH Human (NOVOLIN N) 100 UNIT/ML injection, Inject 0.2-0.33  mLs (20-33 Units total) into the skin 2 (two) times daily before a meal. Takes 33 units am and 20 units pm daily., Disp: 30 mL, Rfl: 3 .  insulin regular (NOVOLIN R) 250 units/2.91mL (100 units/mL) injection, Inject 0.15 mLs (15 Units total) into the skin 3 (three) times daily before meals., Disp: 20 mL, Rfl: 1 .  Multiple Vitamin (MULTIVITAMIN WITH MINERALS) TABS tablet, Take 1 tablet by mouth daily., Disp: , Rfl:  .  NOVOLIN R 100 UNIT/ML injection, INJECT 15 UNITS TOTAL INTO THE SKIN 3 (THREE) TIMES DAILY BEFORE MEALS., Disp: 20 mL, Rfl: 1 .  polyethylene glycol powder (GLYCOLAX/MIRALAX) powder, TAKE 17 G BY MOUTH DAILY., Disp: 1581 g, Rfl: 0 .  rosuvastatin (CRESTOR) 10 MG tablet, TAKE 1 TABLET (10 MG TOTAL) BY MOUTH DAILY., Disp: 90 tablet, Rfl: 3 .  spironolactone (ALDACTONE) 25 MG tablet, Take 1 tablet (25 mg total) by mouth daily., Disp: 90 tablet, Rfl: 3 .  torsemide (DEMADEX) 20 MG tablet, Take 1 tablet (20 mg total) by mouth 2 (two) times daily., Disp: 180 tablet, Rfl: 3 .  valsartan (DIOVAN) 320 MG tablet, Take 1 tablet (320 mg total) by mouth daily., Disp: 90 tablet, Rfl: 3    ALLERGIES   Penicillins     REVIEW OF SYSTEMS   Review of Systems  Constitutional: Negative for chills, diaphoresis, fever, malaise/fatigue and weight loss.  HENT: Positive for congestion. Negative for hearing loss.   Eyes: Negative for blurred vision and double vision.  Respiratory: Positive for cough, shortness of breath and wheezing.   Cardiovascular: Positive for leg swelling. Negative for chest pain, palpitations and orthopnea.  Gastrointestinal: Negative for abdominal pain, heartburn, nausea and vomiting.  Genitourinary: Negative for dysuria and urgency.  Musculoskeletal: Negative for back pain, myalgias and neck pain.  Skin: Negative for rash.  Neurological: Negative for dizziness, tingling, tremors, weakness and headaches.  Endo/Heme/Allergies: Does not bruise/bleed easily.    Psychiatric/Behavioral: Negative for depression, substance abuse and suicidal ideas.  All other systems reviewed and are negative.   BP 138/68 (BP Location: Left Arm, Cuff Size: Normal)   Pulse 100   Wt 278 lb (126.1 kg)   SpO2 97%   BMI 49.25 kg/m     PHYSICAL EXAM  Physical Exam  Constitutional: She is oriented to person, place, and time. She appears well-developed and well-nourished. No distress.  HENT:  Head: Normocephalic and atraumatic.  Mouth/Throat: No oropharyngeal exudate.  Eyes: EOM are normal. Pupils are equal, round, and reactive to light. No scleral icterus.  Neck: Normal range of motion. Neck supple.  Cardiovascular: Normal rate, regular rhythm and normal heart  sounds.   No murmur heard. Pulmonary/Chest: No stridor. No respiratory distress. She has wheezes.  Abdominal: Soft. Bowel sounds are normal.  Musculoskeletal: Normal range of motion. She exhibits edema.  Neurological: She is alert and oriented to person, place, and time. No cranial nerve deficit.  Skin: Skin is warm. She is not diaphoretic.  Psychiatric: She has a normal mood and affect.          CULTURE RESULTS   Recent Results (from the past 240 hour(s))  Urine culture     Status: Abnormal   Collection Time: 09/23/16  2:56 PM  Result Value Ref Range Status   Specimen Description URINE, RANDOM  Final   Special Requests NONE  Final   Culture (A)  Final    >=100,000 COLONIES/mL GROUP B STREP(S.AGALACTIAE)ISOLATED TESTING AGAINST S. AGALACTIAE NOT ROUTINELY PERFORMED DUE TO PREDICTABILITY OF AMP/PEN/VAN SUSCEPTIBILITY. 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  1.continue albuterol  2.start Flovent HFA 110 BID 3.follow up cardiology with Dr. Rockey Situ 4.follow up Nephrology with Dr Candiss Norse 5.recommend weight loss  Follow up in 3 months to assess   Patient satisfied with Plan of action and management. All questions answered  Corrin Parker, M.D.  [facility]  Medical Director [place] Director [facility] Cardio-Pulmonary Department

## 2016-10-01 NOTE — Telephone Encounter (Signed)
Pt states that she was called & told to come in this morning & drop off urine. Please place "future" order.

## 2016-10-02 LAB — URINE CULTURE: ORGANISM ID, BACTERIA: NO GROWTH

## 2016-10-02 NOTE — Telephone Encounter (Signed)
noted 

## 2016-10-04 ENCOUNTER — Telehealth: Payer: Self-pay | Admitting: Internal Medicine

## 2016-10-04 NOTE — Telephone Encounter (Signed)
Please advise on XR results. Thanks.

## 2016-10-04 NOTE — Telephone Encounter (Signed)
Pt would like xray results. Please call. 

## 2016-10-04 NOTE — Progress Notes (Signed)
I have reviewed the above note and agree. I saw the patient in the office with the clinical pharmacist. I discussed the patient's depression with her. She noted no SI. She is resistant to starting any medications for depression or her diabetes. We will continue to monitor.  Tommi Rumps, M.D.

## 2016-10-05 ENCOUNTER — Other Ambulatory Visit: Payer: Self-pay | Admitting: Family Medicine

## 2016-10-05 ENCOUNTER — Telehealth: Payer: Self-pay | Admitting: *Deleted

## 2016-10-05 NOTE — Telephone Encounter (Signed)
Patient notified

## 2016-10-05 NOTE — Telephone Encounter (Signed)
Please let the patient know that the x-ray revealed findings compatible with low-grade compensated heart failure. They also felt that there may be underlying chronic bronchitis or reactive airway disease. There were no acute changes. Please see if the patient is taking torsemide. Thanks.

## 2016-10-05 NOTE — Telephone Encounter (Signed)
Dr. Elliot Gurney office is calling, states pt is calling there office regarding not being able to get xray results. They request we call the pt and give updated status

## 2016-10-05 NOTE — Telephone Encounter (Signed)
Please advise on XR results. Thanks.

## 2016-10-05 NOTE — Telephone Encounter (Signed)
Patient has requested to know if her Xray results are ready to be read, pt is worried that something could be wrong. Pt contact [phone]

## 2016-10-05 NOTE — Telephone Encounter (Signed)
Patient notified, patient states her cough has gotten better but was wondering if she needs any antibiotics or anything, patient was wondering if you would consider this life threatening. She does not want to do any further testing.

## 2016-10-05 NOTE — Telephone Encounter (Signed)
I do not think she needs any antibiotics. I do not consider the findings life threatening. She should continue to monitor her cough and for any breathing issues. Thanks.

## 2016-10-05 NOTE — Telephone Encounter (Signed)
Patient has called pulmonology with no response regarding xray, I have contacted pulmonology for patient and they stated they are waiting on doctor to advise, patient seems very concerned. Please advise

## 2016-10-08 ENCOUNTER — Emergency Department (HOSPITAL_COMMUNITY)
Admission: EM | Admit: 2016-10-08 | Discharge: 2016-10-08 | Disposition: A | Discharge status: Home / Self Care | Payer: Medicare Other | Attending: Emergency Medicine | Admitting: Emergency Medicine

## 2016-10-08 ENCOUNTER — Emergency Department (HOSPITAL_COMMUNITY): Payer: Medicare Other

## 2016-10-08 ENCOUNTER — Encounter (HOSPITAL_COMMUNITY): Payer: Self-pay

## 2016-10-08 DIAGNOSIS — I5032 Chronic diastolic (congestive) heart failure: Secondary | ICD-10-CM | POA: Insufficient documentation

## 2016-10-08 DIAGNOSIS — I130 Hypertensive heart and chronic kidney disease with heart failure and stage 1 through stage 4 chronic kidney disease, or unspecified chronic kidney disease: Secondary | ICD-10-CM | POA: Insufficient documentation

## 2016-10-08 DIAGNOSIS — Z79899 Other long term (current) drug therapy: Secondary | ICD-10-CM | POA: Diagnosis not present

## 2016-10-08 DIAGNOSIS — N950 Postmenopausal bleeding: Secondary | ICD-10-CM | POA: Insufficient documentation

## 2016-10-08 DIAGNOSIS — Z794 Long term (current) use of insulin: Secondary | ICD-10-CM | POA: Diagnosis not present

## 2016-10-08 DIAGNOSIS — N183 Chronic kidney disease, stage 3 (moderate): Secondary | ICD-10-CM | POA: Diagnosis not present

## 2016-10-08 DIAGNOSIS — E1142 Type 2 diabetes mellitus with diabetic polyneuropathy: Secondary | ICD-10-CM | POA: Insufficient documentation

## 2016-10-08 DIAGNOSIS — K625 Hemorrhage of anus and rectum: Secondary | ICD-10-CM | POA: Diagnosis present

## 2016-10-08 LAB — CBC WITH DIFFERENTIAL/PLATELET
BASOS ABS: 0.1 10*3/uL (ref 0.0–0.1)
BASOS PCT: 1 %
EOS PCT: 6 %
Eosinophils Absolute: 0.4 10*3/uL (ref 0.0–0.7)
HCT: 36.7 % (ref 36.0–46.0)
Hemoglobin: 11.9 g/dL — ABNORMAL LOW (ref 12.0–15.0)
LYMPHS PCT: 54 %
Lymphs Abs: 3.1 10*3/uL (ref 0.7–4.0)
MCH: 29.2 pg (ref 26.0–34.0)
MCHC: 32.4 g/dL (ref 30.0–36.0)
MCV: 90 fL (ref 78.0–100.0)
MONO ABS: 0.5 10*3/uL (ref 0.1–1.0)
Monocytes Relative: 9 %
Neutro Abs: 1.8 10*3/uL (ref 1.7–7.7)
Neutrophils Relative %: 30 %
PLATELETS: 192 10*3/uL (ref 150–400)
RBC: 4.08 MIL/uL (ref 3.87–5.11)
RDW: 13.8 % (ref 11.5–15.5)
WBC: 5.8 10*3/uL (ref 4.0–10.5)

## 2016-10-08 LAB — URINALYSIS, DIPSTICK ONLY
BILIRUBIN URINE: NEGATIVE
GLUCOSE, UA: 50 mg/dL — AB
Ketones, ur: NEGATIVE mg/dL
Leukocytes, UA: NEGATIVE
Nitrite: NEGATIVE
Protein, ur: NEGATIVE mg/dL
SPECIFIC GRAVITY, URINE: 1.004 — AB (ref 1.005–1.030)
pH: 6 (ref 5.0–8.0)

## 2016-10-08 LAB — COMPREHENSIVE METABOLIC PANEL
ALBUMIN: 4.1 g/dL (ref 3.5–5.0)
ALT: 28 U/L (ref 14–54)
AST: 34 U/L (ref 15–41)
Alkaline Phosphatase: 39 U/L (ref 38–126)
Anion gap: 11 (ref 5–15)
BILIRUBIN TOTAL: 0.9 mg/dL (ref 0.3–1.2)
BUN: 19 mg/dL (ref 6–20)
CALCIUM: 9.8 mg/dL (ref 8.9–10.3)
CO2: 28 mmol/L (ref 22–32)
Chloride: 98 mmol/L — ABNORMAL LOW (ref 101–111)
Creatinine, Ser: 1.37 mg/dL — ABNORMAL HIGH (ref 0.44–1.00)
GFR calc Af Amer: 42 mL/min — ABNORMAL LOW (ref >60)
GFR calc non Af Amer: 36 mL/min — ABNORMAL LOW (ref >60)
GLUCOSE: 251 mg/dL — AB (ref 65–99)
Potassium: 4.2 mmol/L (ref 3.5–5.1)
SODIUM: 137 mmol/L (ref 135–145)
TOTAL PROTEIN: 7 g/dL (ref 6.5–8.1)

## 2016-10-08 LAB — PROTIME-INR
INR: 0.98
Prothrombin Time: 13 seconds (ref 11.4–15.2)

## 2016-10-08 MED ORDER — TRAMADOL HCL 50 MG PO TABS: 50.0000 mg | ORAL_TABLET | Freq: Four times a day (QID) | ORAL | 0 refills | Status: DC | PRN

## 2016-10-08 NOTE — Discharge Instructions (Signed)
Get help right away if: You have a fever, chills, a headache, dizziness, muscle aches, and bleeding. You have strong pain with bleeding. You have clumps of blood (blood clots) coming from your vagina. You have bleeding and need more than 1 pad an hour. You feel like you are going to pass out (faint).

## 2016-10-08 NOTE — ED Provider Notes (Signed)
[facility] DEPT Provider Note   CSN: [id number] Arrival date & time: 10/08/16  3295     History   Chief Complaint Chief Complaint  Patient presents with  . Rectal Bleeding    HPI Alexis Farmer is a 76 y.o. female with a history of urinary tract infections and endometrial hyperplasia. Presents emergency department with chief complaint of bleeding. Patient states that this is an ongoing problem for her. She has had multiple ER visits at [facility] as well as outpatient workups for her complaints. Patient states that she has been bleeding steadily for the past 5 days. Soaking about half a pad daily. She states that it became heavier today and she noticed that when she urinated the toilet was full of red water. She has noticed some clots on her pad. She has a history of chronic exertional dyspnea. She had a workup by pulmonologist who told her that she needed to be some weight. She denies any worsening shortness of breath. The patient is tearful and states "I am just sick of bleeding." She also states "I'm sick of y'all sending  Me home in am still bleeding." She states that she does have some suprapubic crampiness but denies other abdominal symptoms such as vomiting, diarrhea or constipation. She is unsure if the blood is coming from her vagina or her urethra. She denies bloody bowel movements. She denies dizziness or feelings of presyncope. She denies fevers, chills or body aches.  HPI  Past Medical History:  Diagnosis Date  . Arthritis   . Chickenpox   . Diabetes mellitus without complication ([place])   . Diverticulitis   . GI bleed   . High cholesterol   . History of blood transfusion   . Hypertension   . Renal insufficiency     Patient Active Problem List   Diagnosis Date Noted  . UTI (urinary tract infection) 09/24/2016  . Rectal bleed 07/29/2016  . Carotid stenosis 07/19/2016  . Chronic venous insufficiency 07/19/2016  . Pain and swelling of lower  leg 07/19/2016  . Bradycardia 07/17/2016  . Postmenopausal bleeding 07/17/2016  . Chronic diastolic CHF (congestive heart failure) ([place]) 05/31/2016  . Cough 01/25/2016  . Chronic fatigue 11/15/2015  . Depression 10/13/2015  . Osteoarthritis 10/13/2015  . Vertigo 10/13/2015  . Anxiety 09/13/2015  . BP (high blood pressure) 09/13/2015  . GI bleed 08/03/2015  . Hyperlipidemia 08/26/2014  . Essential hypertension, benign 04/08/2014  . Diabetes mellitus type II, uncontrolled ([place]) 04/08/2014  . CKD (chronic kidney disease) stage 3, GFR 30-59 ml/min 04/08/2014  . Pedal edema 04/08/2014  . Morbid obesity ([place]) 04/08/2014  . Lower back pain 04/08/2014  . Diabetic polyneuropathy ([place]) 04/08/2014    Past Surgical History:  Procedure Laterality Date  . APPENDECTOMY    . ECTOPIC PREGNANCY SURGERY    . gallbladder     . THYROIDECTOMY, PARTIAL      OB History    No data available       Home Medications    Prior to Admission medications   Medication Sig Start Date End Date Taking? Authorizing Provider  albuterol (PROVENTIL HFA;VENTOLIN HFA) 108 (90 Base) MCG/ACT inhaler Inhale 2 puffs into the lungs every 6 (six) hours as needed for wheezing or shortness of breath. 07/17/16   Leone Haven, MD  ALPRAZolam Duanne Moron) 0.5 MG tablet Take 0.5 mg by mouth at bedtime as needed for anxiety or sleep.    Historical Provider, MD  B-D INS SYRINGE 0.5CC/30GX1/2" 30G X 1/2"  0.5 ML MISC USE AS DIRECTED 10/05/16   Leone Haven, MD  cloNIDine (CATAPRES) 0.2 MG tablet TAKE 1 TABLET (0.2 MG TOTAL) BY MOUTH 2 (TWO) TIMES DAILY. 02/03/16   Minna Merritts, MD  fluticasone (FLOVENT HFA) 110 MCG/ACT inhaler Inhale 2 puffs into the lungs 2 (two) times daily. 10/01/16 10/01/17  Flora Lipps, MD  insulin NPH Human (NOVOLIN N) 100 UNIT/ML injection Inject 0.2-0.33 mLs (20-33 Units total) into the skin 2 (two) times daily before a meal. Takes 33 units am and 20 units pm daily. 03/07/16   Leone Haven, MD    insulin regular (NOVOLIN R) 250 units/2.20mL (100 units/mL) injection Inject 0.15 mLs (15 Units total) into the skin 3 (three) times daily before meals. 08/06/16   Leone Haven, MD  Multiple Vitamin (MULTIVITAMIN WITH MINERALS) TABS tablet Take 1 tablet by mouth daily.    Historical Provider, MD  NOVOLIN N 100 UNIT/ML injection INJECT 33 UNITS INTO THE SKIN IN THE MORNING AND 20 UNITS IN THE EVENING DAILY BEFORE A MEAL 10/05/16   Leone Haven, MD  NOVOLIN R 100 UNIT/ML injection INJECT 15 UNITS TOTAL INTO THE SKIN 3 (THREE) TIMES DAILY BEFORE MEALS. 09/24/16   Leone Haven, MD  polyethylene glycol powder (GLYCOLAX/MIRALAX) powder TAKE 17 G BY MOUTH DAILY. 08/10/16   Leone Haven, MD  rosuvastatin (CRESTOR) 10 MG tablet TAKE 1 TABLET (10 MG TOTAL) BY MOUTH DAILY. 11/15/15   Leone Haven, MD  spironolactone (ALDACTONE) 25 MG tablet Take 1 tablet (25 mg total) by mouth daily. 11/15/15   Leone Haven, MD  torsemide (DEMADEX) 20 MG tablet Take 1 tablet (20 mg total) by mouth 2 (two) times daily. 05/31/16   Minna Merritts, MD  valsartan (DIOVAN) 320 MG tablet Take 1 tablet (320 mg total) by mouth daily. 11/15/15   Leone Haven, MD    Family History Family History  Problem Relation Age of Onset  . Diabetes Mother   . Hypertension Mother   . Stroke Mother   . Diabetes Other   . Diabetes Sister   . Heart disease Sister     Social History Social History  Substance Use Topics  . Smoking status: Never Smoker  . Smokeless tobacco: Never Used  . Alcohol use No     Allergies   Penicillins   Review of Systems Review of Systems Ten systems reviewed and are negative for acute change, except as noted in the HPI.    Physical Exam Updated Vital Signs Ht 5\' 3"  (1.6 m)   Wt 124.3 kg   BMI 48.54 kg/m   Physical Exam  Constitutional: She is oriented to person, place, and time. She appears well-developed and well-nourished. No distress.  HENT:  Head:  Normocephalic and atraumatic.  Eyes: Conjunctivae are normal. No scleral icterus.  Neck: Normal range of motion.  Cardiovascular: Normal rate, regular rhythm and normal heart sounds.  Exam reveals no gallop and no friction rub.   No murmur heard. Pulmonary/Chest: Effort normal and breath sounds normal. No respiratory distress.  Abdominal: Soft. Bowel sounds are normal. She exhibits no distension and no mass. There is no tenderness. There is no guarding.  Genitourinary:  Genitourinary Comments: Normal external female genitalia. There is no bleeding from the urethra. Copious bleeding from the cervix, dark red blood, small clotting. No cervical motion tenderness however diffuse tenderness on examination, bimanual exam is limited by patient's body habitus.  Neurological: She is alert and oriented to  person, place, and time.  Skin: Skin is warm and dry. She is not diaphoretic.  Psychiatric: Her behavior is normal.  Nursing note and vitals reviewed.    ED Treatments / Results  Labs (all labs ordered are listed, but only abnormal results are displayed) Labs Reviewed - No data to display  EKG  EKG Interpretation None       Radiology No results found.  Procedures Procedures (including critical care time)  Medications Ordered in ED Medications - No data to display   Initial Impression / Assessment and Plan / ED Course  I have reviewed the triage vital signs and the nursing notes.  Pertinent labs & imaging results that were available during my care of the patient were reviewed by me and considered in my medical decision making (see chart for details).     Patient with postmenopausal vaginal bleeding. She has a stable hemoglobin. Her endometrial stripe is 8 mm and improved significantly from previous measurement of 14 mm and is now within normal limits. I did discuss the case with her nurse at her OB/GYN practice as her doctor was on call in labor and delivery. The patient has has  had multiple visits for this and has been instructed to follow up several times with the OB/GYN yet continues to come to the emergency department. I have set her up for a follow-up appointment next Thursday at 1:45 PM and discussed reasons to seek immediate medical care at the emergency department. She is not orthostatic and appears safe for discharge at this time. Final Clinical Impressions(s) / ED Diagnoses   Final diagnoses:  Post-menopausal bleeding  Postmenopausal bleeding    New Prescriptions New Prescriptions   No medications on file     Margarita Mail, PA-C 10/08/16 [place], MD 10/10/16 [phone]

## 2016-10-08 NOTE — Discharge Planning (Signed)
Pt up for discharge. EDCM reviewed chart for possible CM needs.  No needs identified or communicated.  

## 2016-10-08 NOTE — ED Triage Notes (Signed)
Pt states she has had some bleeding over the last 5 days and is not sure where the bleeding is coming from.

## 2016-10-08 NOTE — ED Notes (Signed)
Patient transported to Ultrasound 

## 2016-10-08 NOTE — Telephone Encounter (Signed)
CXR results called to patient. I have advised patient to follow up with Cardiology and Nephrology

## 2016-10-09 ENCOUNTER — Ambulatory Visit: Payer: Medicare Other | Admitting: Sports Medicine

## 2016-10-17 NOTE — H&P (Signed)
HPI:  Pt presents for an ER followup for PMB. She has been bleeding enough that she woul dlike to go right to  schedule a D&C, hysteroscopy.  She has a hx of: Postmenopausal bleeding.  An endometrial biopsy in January 2008 was negative.  At that time her endometrial stripe is 14 mm.  She went to the ER this week and had an endometrial stripe of 8 mm.  This particular episode of postmenopausal bleeding began a week ago.  The bleeding has been just like a regular menstrual cycle.  She is concerned.  Workup has included:  Interatrial stripe and endometrial biopsy.  Past Medical History:  has a past medical history of Anxiety, unspecified; Chronic kidney disease; Diabetes mellitus type 2, uncomplicated (CMS-HCC); and Hypertension.  Past Surgical History:  has a past surgical history that includes Lobectomy Partial Thyroid (1980); Cholecystectomy; Appendectomy; and Oophorectomy (Right). Family History: family history includes Clotting disorder in her father; Diabetes in her sister, sister, and sister; Diabetes type II in her mother; Myocardial Infarction (Heart attack) in her mother and sister; Stroke in her mother. Social History:  reports that she has never smoked. She has never used smokeless tobacco. She reports that she does not drink alcohol or use drugs. OB/GYN History:          OB History    Gravida Para Term Preterm AB Living   _0 SAB TAB Ectopic Molar Multiple Live Births   0   1            Allergies: is allergic to penicillin. Medications:  Current Outpatient Prescriptions:  .  ALPRAZolam (XANAX) 0.5 MG tablet, TAKE 1 TABLET BY MOUTH NIGHTLY AS NEEDED FOR SLEEP, Disp: 30 tablet, Rfl: 2 .  aspirin 81 MG chewable tablet, Take 81 mg by mouth once daily., Disp: , Rfl:  .  cloNIDine HCl (CATAPRES) 0.2 MG tablet, Take 0.2 mg by mouth 2 (two) times daily., Disp: , Rfl:  .  cyanocobalamin (VITAMIN B12) 1000 MCG tablet, Take 1,000 mcg by mouth once daily. Reported  on 08/12/2015 , Disp: , Rfl:  .  hydrocortisone-pramoxine (PROCTOFOAM-HC) rectal foam, Place 1 applicator rectally 2 (two) times daily., Disp: , Rfl:  .  insulin NPH (HUMULIN N,NOVOLIN N) 100 unit/mL injection, Inject 20-25 units two times daily with meals--25 units in AM and 20 units in PM., Disp: 5 vial, Rfl: 5 .  insulin REGULAR (NOVOLIN R) 100 unit/mL injection, Inject 15 units three times daily with meals., Disp: 4 vial, Rfl: 5 .  meclizine (ANTIVERT) 12.5 mg tablet, TAKE 1 TABLET BY MOUTH 3 TIMES DAILY AS NEEDED FOR DIZZINES FOR UP TO 10 DAYS, Disp: 45 tablet, Rfl: 1 .  metoprolol tartrate (LOPRESSOR) 50 MG tablet, Take 50 mg by mouth 2 (two) times daily., Disp: , Rfl:  .  multivitamin tablet, Take 1 tablet by mouth once daily. Reported on 08/10/2015 , Disp: , Rfl:  .  nitrofurantoin, macrocrystal-monohydrate, (MACROBID) 100 MG capsule, Take 1 capsule (100 mg total) by mouth 2 (two) times daily., Disp: 10 capsule, Rfl: 0 .  ONETOUCH DELICA LANCETS 33 gauge Misc, USE 1 EACH 3 (THREE) TIMES DAILY. USE AS INSTRUCTED. DX: E11.9, Disp: 100 each, Rfl: 5 .  ONETOUCH ULTRA TEST test strip, USE 3 (THREE) TIMES DAILY. USE AS INSTRUCTED. ONE TOUCH DX: E11.9, Disp: 300 each, Rfl: 3 .  ONETOUCH ULTRA2 kit, Use as instructed., Disp: 1 each, Rfl: 0 .  polyethylene glycol (MIRALAX) powder,  MIX 17 GRAMS OF POWDER IN 8 OUNCES OF WATER, DRINK ONCE A DAY (Patient not taking: Reported on 01/27/2016), Disp: 527 g, Rfl: 0 .  PYRIDOXINE HCL (VITAMIN B-6 ORAL), Take 200 mg by mouth once daily. Reported on 08/10/2015 , Disp: , Rfl:  .  rosuvastatin (CRESTOR) 10 MG tablet, Take 10 mg by mouth once daily., Disp: , Rfl:  .  spironolactone (ALDACTONE) 25 MG tablet, Take 1 tablet (25 mg total) by mouth once daily., Disp: 90 tablet, Rfl: 3 .  TORsemide (DEMADEX) 20 MG tablet, Take 20 mg by mouth once daily., Disp: , Rfl:  .  valsartan (DIOVAN) 320 MG tablet, Take 320 mg by mouth once daily., Disp: , Rfl:  .  verapamil (VERELAN)  240 MG SR capsule, Take 240 mg by mouth once daily.  , Disp: , Rfl:   Review of Systems: No SOB, no palpitations or chest pain, no new lower extremity edema, no nausea or vomiting or bowel or bladder complaints. See HPI for gyn specific ROS.   Exam:      Vitals:   10/11/16 1410  BP: 130/80    WDWN   female in NAD Body mass index is 49.42 kg/m.  General: Patient is well-groomed, well-nourished, appears stated age in no acute distress  HEENT: head is atraumatic and normocephalic, trachea is midline, neck is supple with no palpable nodules  CV: Regular rhythm and normal heart rate, no murmur  Pulm: Clear to auscultation throughout lung fields with no wheezing, crackles, or rhonchi. No increased work of breathing  Abdomen: soft , no mass, non-tender, no rebound tenderness, no hepatomegaly  Pelvic: Deferred  Impression:   The encounter diagnosis was Post-menopausal bleeding.    Plan:   -  Preoperative visit: D&C hysteroscopy. Consents signed today. Risks of surgery were discussed with the patient including but not limited to: bleeding which may require transfusion; infection which may require antibiotics; injury to uterus or surrounding organs; intrauterine scarring which may impair future fertility; need for additional procedures including laparotomy or laparoscopy; and other postoperative/anesthesia complications. Written informed consent was obtained.  This is a scheduled same-day surgery. She will have a postop visit in 2 weeks to review operative findings and pathology.  Will need small speculum Hx of DMT2 Allergies to PCN- severe  Lorrie Strauch Monika Salk, MD

## 2016-10-18 ENCOUNTER — Telehealth: Payer: Self-pay | Admitting: *Deleted

## 2016-10-18 NOTE — Telephone Encounter (Signed)
fyi

## 2016-10-18 NOTE — Telephone Encounter (Signed)
Noted  

## 2016-10-18 NOTE — Telephone Encounter (Signed)
Patient wanted to [facility] Dr. Caryl Bis that Dr. Candiss Norse added a medication called clonidine Pt contact [phone]

## 2016-10-23 ENCOUNTER — Telehealth: Payer: Self-pay | Admitting: *Deleted

## 2016-10-23 ENCOUNTER — Encounter
Admission: RE | Admit: 2016-10-23 | Discharge: 2016-10-23 | Disposition: A | Discharge status: Home / Self Care | Payer: Medicare Other | Source: Ambulatory Visit | Attending: Obstetrics and Gynecology | Admitting: Obstetrics and Gynecology

## 2016-10-23 ENCOUNTER — Encounter: Payer: Self-pay | Admitting: *Deleted

## 2016-10-23 DIAGNOSIS — Z79899 Other long term (current) drug therapy: Secondary | ICD-10-CM | POA: Diagnosis not present

## 2016-10-23 DIAGNOSIS — Z794 Long term (current) use of insulin: Secondary | ICD-10-CM | POA: Diagnosis not present

## 2016-10-23 DIAGNOSIS — Z90721 Acquired absence of ovaries, unilateral: Secondary | ICD-10-CM | POA: Diagnosis not present

## 2016-10-23 DIAGNOSIS — N840 Polyp of corpus uteri: Secondary | ICD-10-CM | POA: Diagnosis not present

## 2016-10-23 DIAGNOSIS — E1151 Type 2 diabetes mellitus with diabetic peripheral angiopathy without gangrene: Secondary | ICD-10-CM | POA: Diagnosis not present

## 2016-10-23 DIAGNOSIS — E1122 Type 2 diabetes mellitus with diabetic chronic kidney disease: Secondary | ICD-10-CM | POA: Diagnosis not present

## 2016-10-23 DIAGNOSIS — F419 Anxiety disorder, unspecified: Secondary | ICD-10-CM | POA: Diagnosis not present

## 2016-10-23 DIAGNOSIS — I129 Hypertensive chronic kidney disease with stage 1 through stage 4 chronic kidney disease, or unspecified chronic kidney disease: Secondary | ICD-10-CM | POA: Diagnosis not present

## 2016-10-23 DIAGNOSIS — Z7982 Long term (current) use of aspirin: Secondary | ICD-10-CM | POA: Diagnosis not present

## 2016-10-23 DIAGNOSIS — N189 Chronic kidney disease, unspecified: Secondary | ICD-10-CM | POA: Diagnosis not present

## 2016-10-23 DIAGNOSIS — N950 Postmenopausal bleeding: Secondary | ICD-10-CM | POA: Diagnosis present

## 2016-10-23 LAB — BASIC METABOLIC PANEL
Anion gap: 9 (ref 5–15)
BUN: 27 mg/dL — ABNORMAL HIGH (ref 6–20)
CALCIUM: 9.9 mg/dL (ref 8.9–10.3)
CHLORIDE: 97 mmol/L — AB (ref 101–111)
CO2: 32 mmol/L (ref 22–32)
CREATININE: 1.34 mg/dL — AB (ref 0.44–1.00)
GFR calc non Af Amer: 37 mL/min — ABNORMAL LOW (ref >60)
GFR, EST AFRICAN AMERICAN: 43 mL/min — AB (ref >60)
Glucose, Bld: 224 mg/dL — ABNORMAL HIGH (ref 65–99)
Potassium: 4.4 mmol/L (ref 3.5–5.1)
Sodium: 138 mmol/L (ref 135–145)

## 2016-10-23 LAB — TYPE AND SCREEN
ABO/RH(D): A POS
Antibody Screen: NEGATIVE

## 2016-10-23 LAB — CBC
HCT: 39.7 % (ref 35.0–47.0)
Hemoglobin: 13.1 g/dL (ref 12.0–16.0)
MCH: 29.4 pg (ref 26.0–34.0)
MCHC: 33.2 g/dL (ref 32.0–36.0)
MCV: 88.8 fL (ref 80.0–100.0)
PLATELETS: 231 10*3/uL (ref 150–440)
RBC: 4.47 MIL/uL (ref 3.80–5.20)
RDW: 13.7 % (ref 11.5–14.5)
WBC: 6.7 10*3/uL (ref 3.6–11.0)

## 2016-10-23 NOTE — Telephone Encounter (Signed)
FYI :Patient wanted to [place] that she will have same day surgery on 10/26/16, to determine location of bleeding. This procedure will be done by Dr. Leafy Ro. Pt contact [phone]

## 2016-10-23 NOTE — Patient Instructions (Signed)
  Your procedure is scheduled on:Fri. 10/26/16 Report to Day Surgery. To find out your arrival time please call [phone] between 1PM - 3PM on Thurs. 10/25/16.  Remember: Instructions that are not followed completely may result in serious medical risk, up to and including death, or upon the discretion of your surgeon and anesthesiologist your surgery may need to be rescheduled.    __x__ 1. Do not eat food or drink liquids after midnight. No gum chewing or hard candies.     __x__ 2. No Alcohol for 24 hours before or after surgery.   ____ 3. Do Not Smoke For 24 Hours Prior to Your Surgery.   ____ 4. Bring all medications with you on the day of surgery if instructed.    _x___ 5. Notify your doctor if there is any change in your medical condition     (cold, fever, infections).       Do not wear jewelry, make-up, hairpins, clips or nail polish.  Do not wear lotions, powders, or perfumes. You may wear deodorant.  Do not shave 48 hours prior to surgery. Men may shave face and neck.  Do not bring valuables to the hospital.    [facility] is not responsible for any belongings or valuables.               Contacts, dentures or bridgework may not be worn into surgery.  Leave your suitcase in the car. After surgery it may be brought to your room.  For patients admitted to the hospital, discharge time is determined by your                treatment team.   Patients discharged the day of surgery will not be allowed to drive home.   Please read over the following fact sheets that you were given:      __x__ Take these medicines the morning of surgery with A SIP OF WATER:    1. cloNIDine (CATAPRES) 0.2 MG tablet  2. valsartan (DIOVAN) 320 MG tablet  3.   4.  5.  6.  ____ Fleet Enema (as directed)   __x__ Use Dial Soap as directed night before and morning of surgery  __x__ Use inhalers on the day of surgery  ____ Stop metformin 2 days prior to surgery    ___x_ Take 1/2 of usual  insulin dose the night before surgery and none on the morning of surgery.   ____ Stop Coumadin/Plavix/aspirin on   __x__ Stop Anti-inflammatories on today.  Ibuprofen, Advil or Aleve   ____ Stop supplements until after surgery.    ____ Bring C-Pap to the hospital.

## 2016-10-23 NOTE — Telephone Encounter (Signed)
fyi

## 2016-10-26 ENCOUNTER — Encounter
Admission: RE | Disposition: A | Discharge status: Home / Self Care | Payer: Self-pay | Source: Ambulatory Visit | Attending: Obstetrics and Gynecology

## 2016-10-26 ENCOUNTER — Ambulatory Visit: Payer: Medicare Other | Admitting: Anesthesiology

## 2016-10-26 ENCOUNTER — Ambulatory Visit
Admission: RE | Admit: 2016-10-26 | Discharge: 2016-10-26 | Disposition: A | Discharge status: Home / Self Care | Payer: Medicare Other | Source: Ambulatory Visit | Attending: Obstetrics and Gynecology | Admitting: Obstetrics and Gynecology

## 2016-10-26 ENCOUNTER — Encounter: Payer: Self-pay | Admitting: *Deleted

## 2016-10-26 DIAGNOSIS — E1122 Type 2 diabetes mellitus with diabetic chronic kidney disease: Secondary | ICD-10-CM | POA: Insufficient documentation

## 2016-10-26 DIAGNOSIS — Z79899 Other long term (current) drug therapy: Secondary | ICD-10-CM | POA: Insufficient documentation

## 2016-10-26 DIAGNOSIS — F419 Anxiety disorder, unspecified: Secondary | ICD-10-CM | POA: Insufficient documentation

## 2016-10-26 DIAGNOSIS — N950 Postmenopausal bleeding: Secondary | ICD-10-CM | POA: Insufficient documentation

## 2016-10-26 DIAGNOSIS — Z794 Long term (current) use of insulin: Secondary | ICD-10-CM | POA: Insufficient documentation

## 2016-10-26 DIAGNOSIS — E1151 Type 2 diabetes mellitus with diabetic peripheral angiopathy without gangrene: Secondary | ICD-10-CM | POA: Insufficient documentation

## 2016-10-26 DIAGNOSIS — Z90721 Acquired absence of ovaries, unilateral: Secondary | ICD-10-CM | POA: Insufficient documentation

## 2016-10-26 DIAGNOSIS — N840 Polyp of corpus uteri: Secondary | ICD-10-CM | POA: Insufficient documentation

## 2016-10-26 DIAGNOSIS — I129 Hypertensive chronic kidney disease with stage 1 through stage 4 chronic kidney disease, or unspecified chronic kidney disease: Secondary | ICD-10-CM | POA: Insufficient documentation

## 2016-10-26 DIAGNOSIS — Z7982 Long term (current) use of aspirin: Secondary | ICD-10-CM | POA: Insufficient documentation

## 2016-10-26 DIAGNOSIS — N189 Chronic kidney disease, unspecified: Secondary | ICD-10-CM | POA: Insufficient documentation

## 2016-10-26 HISTORY — PX: HYSTEROSCOPY WITH D & C: SHX1775

## 2016-10-26 LAB — GLUCOSE, CAPILLARY
GLUCOSE-CAPILLARY: 187 mg/dL — AB (ref 65–99)
Glucose-Capillary: 242 mg/dL — ABNORMAL HIGH (ref 65–99)

## 2016-10-26 SURGERY — DILATATION AND CURETTAGE /HYSTEROSCOPY
Anesthesia: General

## 2016-10-26 MED ORDER — FENTANYL CITRATE (PF) 100 MCG/2ML IJ SOLN
25.0000 ug | INTRAMUSCULAR | Status: DC | PRN
  Administered 2016-10-26 (×4): 25 ug via INTRAVENOUS

## 2016-10-26 MED ORDER — FENTANYL CITRATE (PF) 100 MCG/2ML IJ SOLN
INTRAMUSCULAR | Status: AC
  Administered 2016-10-26: 25 ug via INTRAVENOUS
  Filled 2016-10-26: qty 2

## 2016-10-26 MED ORDER — OXYCODONE HCL 5 MG/5ML PO SOLN: 5.0000 mg | Freq: Once | ORAL | Status: DC | PRN

## 2016-10-26 MED ORDER — ONDANSETRON HCL 4 MG/2ML IJ SOLN
INTRAMUSCULAR | Status: DC | PRN
  Administered 2016-10-26: 4 mg via INTRAVENOUS

## 2016-10-26 MED ORDER — IBUPROFEN 800 MG PO TABS: 800.0000 mg | ORAL_TABLET | Freq: Three times a day (TID) | ORAL | 1 refills | Status: DC | PRN

## 2016-10-26 MED ORDER — FENTANYL CITRATE (PF) 100 MCG/2ML IJ SOLN
INTRAMUSCULAR | Status: DC | PRN
  Administered 2016-10-26 (×2): 50 ug via INTRAVENOUS

## 2016-10-26 MED ORDER — OXYCODONE HCL 5 MG PO TABS
5.0000 mg | ORAL_TABLET | Freq: Once | ORAL | Status: DC | PRN
Start: 2016-10-26 — End: 2016-10-26

## 2016-10-26 MED ORDER — DEXAMETHASONE SODIUM PHOSPHATE 10 MG/ML IJ SOLN
INTRAMUSCULAR | Status: DC | PRN
  Administered 2016-10-26: 10 mg via INTRAVENOUS

## 2016-10-26 MED ORDER — DOCUSATE SODIUM 100 MG PO CAPS: 100.0000 mg | ORAL_CAPSULE | Freq: Two times a day (BID) | ORAL | 0 refills | Status: DC

## 2016-10-26 MED ORDER — KETOROLAC TROMETHAMINE 30 MG/ML IJ SOLN: 15.0000 mg | Freq: Once | INTRAMUSCULAR | Status: DC

## 2016-10-26 MED ORDER — PROPOFOL 10 MG/ML IV BOLUS
INTRAVENOUS | Status: DC | PRN
  Administered 2016-10-26: 200 mg via INTRAVENOUS
  Administered 2016-10-26: 150 mg via INTRAVENOUS
  Administered 2016-10-26: 50 mg via INTRAVENOUS

## 2016-10-26 MED ORDER — SUCCINYLCHOLINE CHLORIDE 20 MG/ML IJ SOLN
INTRAMUSCULAR | Status: DC | PRN
  Administered 2016-10-26: 200 mg via INTRAVENOUS
  Administered 2016-10-26: 100 mg via INTRAVENOUS

## 2016-10-26 MED ORDER — KETOROLAC TROMETHAMINE 30 MG/ML IJ SOLN
INTRAMUSCULAR | Status: AC
  Filled 2016-10-26: qty 1

## 2016-10-26 MED ORDER — FAMOTIDINE 20 MG PO TABS
ORAL_TABLET | ORAL | Status: AC
  Filled 2016-10-26: qty 1

## 2016-10-26 MED ORDER — GLYCOPYRROLATE 0.2 MG/ML IJ SOLN
INTRAMUSCULAR | Status: DC | PRN
  Administered 2016-10-26 (×2): 0.2 mg via INTRAVENOUS

## 2016-10-26 MED ORDER — SUGAMMADEX SODIUM 200 MG/2ML IV SOLN
INTRAVENOUS | Status: DC | PRN
  Administered 2016-10-26: 252.2 mg via INTRAVENOUS

## 2016-10-26 MED ORDER — KETOROLAC TROMETHAMINE 30 MG/ML IJ SOLN
INTRAMUSCULAR | Status: DC | PRN
  Administered 2016-10-26: 15 mg via INTRAVENOUS

## 2016-10-26 MED ORDER — LIDOCAINE HCL (CARDIAC) 20 MG/ML IV SOLN
INTRAVENOUS | Status: DC | PRN
  Administered 2016-10-26: 50 mg via INTRAVENOUS

## 2016-10-26 MED ORDER — FENTANYL CITRATE (PF) 100 MCG/2ML IJ SOLN
INTRAMUSCULAR | Status: AC
  Filled 2016-10-26: qty 2

## 2016-10-26 MED ORDER — FAMOTIDINE 20 MG PO TABS
20.0000 mg | ORAL_TABLET | Freq: Once | ORAL | Status: AC
  Administered 2016-10-26: 20 mg via ORAL

## 2016-10-26 MED ORDER — SODIUM CHLORIDE 0.9 % IV SOLN
INTRAVENOUS | Status: DC
  Administered 2016-10-26: 08:00:00 via INTRAVENOUS

## 2016-10-26 MED ORDER — ROCURONIUM BROMIDE 100 MG/10ML IV SOLN
INTRAVENOUS | Status: DC | PRN
Start: 2016-10-26 — End: 2016-10-26
  Administered 2016-10-26: 10 mg via INTRAVENOUS
  Administered 2016-10-26: 20 mg via INTRAVENOUS

## 2016-10-26 MED ORDER — LACTATED RINGERS IV SOLN
INTRAVENOUS | Status: DC | PRN
  Administered 2016-10-26: 10:00:00 via INTRAVENOUS

## 2016-10-26 SURGICAL SUPPLY — 20 items
BAG INFUSER PRESSURE 100CC (MISCELLANEOUS) ×2 IMPLANT
CANISTER SUCT 3000ML PPV (MISCELLANEOUS) ×2 IMPLANT
CATH ROBINSON RED A/P 16FR (CATHETERS) ×2 IMPLANT
CORD URO TURP 10FT (MISCELLANEOUS) IMPLANT
ELECT LOOP MED HF 24F 12D (CUTTING LOOP) IMPLANT
ELECT REM PT RETURN 9FT ADLT (ELECTROSURGICAL) ×2
ELECT RESECT POWERBALL 24F (MISCELLANEOUS) IMPLANT
ELECTRODE REM PT RTRN 9FT ADLT (ELECTROSURGICAL) ×1 IMPLANT
GLOVE BIO SURGEON STRL SZ7 (GLOVE) ×2 IMPLANT
GLOVE INDICATOR 7.5 STRL GRN (GLOVE) ×2 IMPLANT
GOWN STRL REUS W/ TWL LRG LVL3 (GOWN DISPOSABLE) ×2 IMPLANT
GOWN STRL REUS W/TWL LRG LVL3 (GOWN DISPOSABLE) ×2
IV LACTATED RINGER IRRG 3000ML (IV SOLUTION) ×1
IV LACTATED RINGERS 1000ML (IV SOLUTION) IMPLANT
IV LR IRRIG 3000ML ARTHROMATIC (IV SOLUTION) ×1 IMPLANT
KIT RM TURNOVER CYSTO AR (KITS) ×2 IMPLANT
PACK DNC HYST (MISCELLANEOUS) ×2 IMPLANT
PAD OB MATERNITY 4.3X12.25 (PERSONAL CARE ITEMS) ×2 IMPLANT
PAD PREP 24X41 OB/GYN DISP (PERSONAL CARE ITEMS) ×2 IMPLANT
TUBING CONNECTING 10 (TUBING) ×2 IMPLANT

## 2016-10-26 NOTE — Op Note (Addendum)
Operative Report Hysteroscopy with Dilation and Curettage   Indications: Endometrial fluid, thickened ES   Pre-operative Diagnosis: Endometrial fluid, thickened ES    Post-operative Diagnosis: same.  Procedure: 1. Exam under anesthesia 2. Fractional D&C 3. Hysteroscopy  Surgeon: Benjaman Kindler, MD  Assistant(s):  None  Anesthesia: General endotracheal anesthesia  Anesthesiologist: Piscitello, Precious Haws, MD Anesthesiologist: Amie Critchley Precious Haws, MD CRNA: Demetrius Charity, CRNA; Timoteo Expose, CRNA  Estimated Blood Loss:  Minimal         Intraoperative medications: none         Total IV Fluids: 755ml  Urine Output: 27ml  Total Fluid Deficit:  100 mL          Specimens: Endocervical curettings, endometrial curettings         Complications:  None; patient tolerated the procedure well.         Disposition: PACU - hemodynamically stable.         Condition: stable  Findings: Uterus measuring 8 cm by sound; normal cervix, vagina, perineum. Multiple endometrial polyps with well-estrogenized endometrial tissue.  Indication for procedure/Consents: 76 y.o.  here for scheduled surgery for the aforementioned diagnoses.   Risks of surgery were discussed with the patient including but not limited to: bleeding which may require transfusion; infection which may require antibiotics; injury to uterus or surrounding organs; intrauterine scarring which may impair future fertility; need for additional procedures including laparotomy or laparoscopy; and other postoperative/anesthesia complications. Written informed consent was obtained.    Procedure Details:   D&C/ Myosure  The patient was taken to the operating room where anesthesia was administered and was found to be adequate. After a formal and adequate timeout was performed, she was placed in the dorsal lithotomy position and examined with the above findings. She was then prepped and draped in the sterile manner. Her bladder  was catheterized for an estimated amount of clear, yellow urine. A weighed speculum was then placed in the patient's vagina and a single tooth tenaculum was applied to the anterior lip of the cervix.  Her cervix was serially dilated to 15 [place] using Hanks dilators. An ECC was performed. The hysteroscope was introduced under direct observation  Using lactated ringers as a distention medium to reveal the above findings. The uterine cavity was carefully examined, both ostia were recognized, and diffusely proliferative endometrium with the polyps above were noted.   These were resected using the Myosure device.  After further careful visualization of the uterine cavity, the hysteroscope was removed under direct visualization.  A sharp curettage was then performed until there was a gritty texture in all four quadrants. The tenaculum was removed from the anterior lip of the cervix and the vaginal speculum was removed after applying silver nitrate for good hemostasis.   The patient tolerated the procedure well and was taken to the recovery area awake and in stable condition. She received iv acetaminophen and Toradol prior to leaving the OR.  The patient will be discharged to home as per PACU criteria. Routine postoperative instructions given. She was prescribed Ibuprofen and Colace. She will follow up in the clinic in two weeks for postoperative evaluation.

## 2016-10-26 NOTE — Interval H&P Note (Signed)
History and Physical Interval Note:  10/26/2016 7:53 AM  Alexis Farmer  has presented today for surgery, with the diagnosis of PMB  The various methods of treatment have been discussed with the patient and family. After consideration of risks, benefits and other options for treatment, the patient has consented to  Procedure(s): DILATATION AND CURETTAGE /HYSTEROSCOPY (N/A) as a surgical intervention .  The patient's history has been reviewed, patient examined, no change in status, stable for surgery.  I have reviewed the patient's chart and labs.  Questions were answered to the patient's satisfaction.     Benjaman Kindler

## 2016-10-26 NOTE — Transfer of Care (Signed)
Immediate Anesthesia Transfer of Care Note  Patient: Alexis Farmer  Procedure(s) Performed: Procedure(s): DILATATION AND CURETTAGE /HYSTEROSCOPY (N/A)  Patient Location: PACU  Anesthesia Type:General  Level of Consciousness: awake  Airway & Oxygen Therapy: Patient Spontanous Breathing  Post-op Assessment: Report given to RN  Post vital signs: Reviewed  Last Vitals:  Vitals:   10/26/16 0736  BP: (!) 158/59  Pulse: 78  Resp: 16  Temp: 37 C    Last Pain:  Vitals:   10/26/16 0736  TempSrc: Oral  PainSc: 5          Complications: No apparent anesthesia complications

## 2016-10-26 NOTE — Anesthesia Post-op Follow-up Note (Cosign Needed)
Anesthesia QCDR form completed.        

## 2016-10-26 NOTE — Anesthesia Procedure Notes (Signed)
Procedure Name: Intubation Date/Time: 10/26/2016 10:15 AM Performed by: Timoteo Expose Pre-anesthesia Checklist: Patient identified, Emergency Drugs available, Suction available, Patient being monitored and Timeout performed Patient Re-evaluated:Patient Re-evaluated prior to inductionOxygen Delivery Method: Circle system utilized Preoxygenation: Pre-oxygenation with 100% oxygen Intubation Type: IV induction Ventilation: Mask ventilation without difficulty Laryngoscope Size: Mac and 3 Grade View: Grade III Tube type: Oral Tube size: 7.0 mm Number of attempts: 1 Airway Equipment and Method: Patient positioned with wedge pillow Placement Confirmation: ETT inserted through vocal cords under direct vision,  positive ETCO2 and breath sounds checked- equal and bilateral Secured at: 23 cm Tube secured with: Tape Dental Injury: Teeth and Oropharynx as per pre-operative assessment

## 2016-10-26 NOTE — Discharge Instructions (Addendum)
Discharge instructions after a hysteroscopy with dilation and curettage  Signs and Symptoms to Report  Call our office at [phone] if you have any of the following:   . Fever over 100.4 degrees or higher . Severe stomach pain not relieved with pain medications . Bright red bleeding that's heavier than a period that does not slow with rest after the first 24 hours . To go the bathroom a lot (frequency), you can't hold your urine (urgency), or it hurts when you empty your bladder (urinate) . Chest pain . Shortness of breath . Pain in the calves of your legs . Severe nausea and vomiting not relieved with anti-nausea medications . Any concerns  What You Can Expect after Surgery . You may see some pink tinged, bloody fluid. This is normal. You may also have cramping for several days.   Activities after Your Discharge Follow these guidelines to help speed your recovery at home: . Don't drive if you are in pain or taking narcotic pain medicine. You may drive when you can safely slam on the brakes, turn the wheel forcefully, and rotate your torso comfortably. This is typically 4-7 days. Practice in a parking lot or side street prior to attempting to drive regularly.  . Ask others to help with household chores for 4 weeks. . Don't do strenuous activities, exercises, or sports like vacuuming, tennis, squash, etc. until your doctor says it is safe to do so. . Walk as you feel able. Rest often since it may take a week or two for your energy level to return to normal.  . You may climb stairs . Avoid constipation:   -Eat fruits, vegetables, and whole grains. Eat small meals as your appetite will take time to return to normal.   -Drink 6 to 8 glasses of water each day unless your doctor has told you to limit your fluids.   -Use a laxative or stool softener as needed if constipation becomes a problem. You may take Miralax, metamucil, Citrucil, Colace, Senekot, FiberCon, etc. If this does not  relieve the constipation, try two tablespoons of Milk Of Magnesia every 8 hours until your bowels move.  . You may shower.  . Do not get in a hot tub, swimming pool, etc. until your doctor agrees. . Do not douche, use tampons, or have sex until your doctor says it is okay, usually about 2 weeks. . Take your pain medicine when you need it. The medicine may not work as well if the pain is bad.  Take the medicines you were taking before surgery. Other medications you might need are pain medications (ibuprofen), medications for constipation (Colace) and nausea medications (Zofran).        AMBULATORY SURGERY  DISCHARGE INSTRUCTIONS   1) The drugs that you were given will stay in your system until tomorrow so for the next 24 hours you should not:  A) Drive an automobile B) Make any legal decisions C) Drink any alcoholic beverage   2) You may resume regular meals tomorrow.  Today it is better to start with liquids and gradually work up to solid foods.  You may eat anything you prefer, but it is better to start with liquids, then soup and crackers, and gradually work up to solid foods.   3) Please notify your doctor immediately if you have any unusual bleeding, trouble breathing, redness and pain at the surgery site, drainage, fever, or pain not relieved by medication.    4) Additional Instructions:          Please contact your physician with any problems or Same Day Surgery at [phone], Monday through Friday 6 am to 4 pm, or [address] [facility] number at [phone]. 

## 2016-10-26 NOTE — Anesthesia Preprocedure Evaluation (Signed)
Anesthesia Evaluation  Patient identified by MRN, date of birth, ID band Patient awake    Reviewed: Allergy & Precautions, H&P , NPO status , Patient's Chart, lab work & pertinent test results  History of Anesthesia Complications Negative for: history of anesthetic complications  Airway Mallampati: III  TM Distance: >3 FB Neck ROM: limited    Dental  (+) Poor Dentition, Missing, Chipped   Pulmonary neg shortness of breath, pneumonia, resolved,    Pulmonary exam normal breath sounds clear to auscultation       Cardiovascular Exercise Tolerance: Good hypertension, (-) angina+ Peripheral Vascular Disease and +CHF  (-) Past MI Normal cardiovascular exam Rhythm:regular Rate:Normal     Neuro/Psych PSYCHIATRIC DISORDERS Anxiety Depression  Neuromuscular disease    GI/Hepatic negative GI ROS, Neg liver ROS, neg GERD  ,  Endo/Other  negative endocrine ROSdiabetes, Type 2  Renal/GU CRFRenal disease     Musculoskeletal  (+) Arthritis ,   Abdominal   Peds  Hematology negative hematology ROS (+)   Anesthesia Other Findings Patient has sore throat preOp  Past Medical History: No date: Arthritis No date: Chickenpox No date: Diabetes mellitus without complication (HCC)     Comment: one elevated reading/ no treatment No date: Diverticulitis No date: GI bleed No date: High cholesterol No date: History of blood transfusion No date: Hypertension No date: Renal insufficiency  Past Surgical History: No date: APPENDECTOMY No date: ECTOPIC PREGNANCY SURGERY No date: EYE SURGERY     Comment: bilateral cataracts No date: gallbladder  No date: THYROIDECTOMY, PARTIAL  BMI    Body Mass Index:  49.25 kg/m      Reproductive/Obstetrics negative OB ROS                             Anesthesia Physical Anesthesia Plan  ASA: III  Anesthesia Plan: General ETT   Post-op Pain Management:     Induction: Intravenous  Airway Management Planned: Oral ETT  Additional Equipment:   Intra-op Plan:   Post-operative Plan: Extubation in OR  Informed Consent: I have reviewed the patients History and Physical, chart, labs and discussed the procedure including the risks, benefits and alternatives for the proposed anesthesia with the patient or authorized representative who has indicated his/her understanding and acceptance.   Dental Advisory Given  Plan Discussed with: Anesthesiologist, CRNA and Surgeon  Anesthesia Plan Comments: (Patient consented for risks of anesthesia including but not limited to:  - adverse reactions to medications - damage to teeth, lips or other oral mucosa - sore throat or hoarseness - Damage to heart, brain, lungs or loss of life  Patient voiced understanding.)        Anesthesia Quick Evaluation

## 2016-10-26 NOTE — Anesthesia Postprocedure Evaluation (Signed)
Anesthesia Post Note  Patient: Alexis Farmer  Procedure(s) Performed: Procedure(s) (LRB): DILATATION AND CURETTAGE /HYSTEROSCOPY (N/A)  Patient location during evaluation: PACU Anesthesia Type: General Level of consciousness: awake and alert Pain management: pain level controlled Vital Signs Assessment: post-procedure vital signs reviewed and stable Respiratory status: spontaneous breathing, nonlabored ventilation, respiratory function stable and patient connected to nasal cannula oxygen Cardiovascular status: blood pressure returned to baseline and stable Postop Assessment: no signs of nausea or vomiting Anesthetic complications: no     Last Vitals:  Vitals:   10/26/16 1215 10/26/16 1227  BP: 99/69 (!) 127/50  Pulse: 65 69  Resp: 15 16  Temp:      Last Pain:  Vitals:   10/26/16 1227  TempSrc: Temporal  PainSc: 0-No pain                 Precious Haws Shella Lahman

## 2016-10-27 ENCOUNTER — Encounter: Payer: Self-pay | Admitting: Obstetrics and Gynecology

## 2016-10-29 LAB — SURGICAL PATHOLOGY

## 2016-11-01 ENCOUNTER — Telehealth: Payer: Self-pay | Admitting: *Deleted

## 2016-11-01 ENCOUNTER — Other Ambulatory Visit: Payer: Self-pay | Admitting: Family Medicine

## 2016-11-01 DIAGNOSIS — N183 Chronic kidney disease, stage 3 unspecified: Secondary | ICD-10-CM

## 2016-11-01 DIAGNOSIS — E1122 Type 2 diabetes mellitus with diabetic chronic kidney disease: Secondary | ICD-10-CM

## 2016-11-01 DIAGNOSIS — E1165 Type 2 diabetes mellitus with hyperglycemia: Principal | ICD-10-CM

## 2016-11-01 DIAGNOSIS — IMO0002 Reserved for concepts with insufficient information to code with codable children: Secondary | ICD-10-CM

## 2016-11-01 DIAGNOSIS — Z794 Long term (current) use of insulin: Principal | ICD-10-CM

## 2016-11-01 MED ORDER — INSULIN REGULAR HUMAN 100 UNIT/ML IJ SOLN: INTRAMUSCULAR | 1 refills | Status: DC

## 2016-11-01 NOTE — Telephone Encounter (Signed)
Patient requested a medication refill for Novolin, Pt stated that she has 2 vales , however she used 4 monthly.  Pharmacy [facility] [place]  Pt contact [phone]

## 2016-11-01 NOTE — Telephone Encounter (Signed)
Last OV 09/24/16 last filled  Xanax filed under historical miralax 08/10/16 1581 g 0rf Aldactone 11/15/15 90 3rf  Torsemide 05/31/16 180 3rf by Dr.Gollan

## 2016-11-01 NOTE — Telephone Encounter (Signed)
LMTCB. Need to ask pt which novolin she needs refilled. She uses both the N and the R.

## 2016-11-01 NOTE — Telephone Encounter (Signed)
Spoke with pt and she stated that it is the Novolin R that she needed refilled. Notified pt that rx has been sent.

## 2016-11-02 ENCOUNTER — Ambulatory Visit: Payer: Medicare Other | Admitting: Family Medicine

## 2016-11-02 NOTE — Telephone Encounter (Signed)
faxed

## 2016-11-02 NOTE — Telephone Encounter (Signed)
Please fax Xanax.

## 2016-11-05 ENCOUNTER — Other Ambulatory Visit: Payer: Self-pay | Admitting: Family Medicine

## 2016-11-07 ENCOUNTER — Other Ambulatory Visit: Payer: Self-pay | Admitting: Family Medicine

## 2016-11-07 DIAGNOSIS — Z794 Long term (current) use of insulin: Principal | ICD-10-CM

## 2016-11-07 DIAGNOSIS — E1165 Type 2 diabetes mellitus with hyperglycemia: Principal | ICD-10-CM

## 2016-11-07 DIAGNOSIS — E1122 Type 2 diabetes mellitus with diabetic chronic kidney disease: Secondary | ICD-10-CM

## 2016-11-07 DIAGNOSIS — N183 Chronic kidney disease, stage 3 unspecified: Secondary | ICD-10-CM

## 2016-11-07 DIAGNOSIS — IMO0002 Reserved for concepts with insufficient information to code with codable children: Secondary | ICD-10-CM

## 2016-11-07 MED ORDER — INSULIN NPH (HUMAN) (ISOPHANE) 100 UNIT/ML ~~LOC~~ SUSP
SUBCUTANEOUS | 1 refills | Status: DC
Start: 2016-11-07 — End: 2017-04-08

## 2016-11-07 MED ORDER — INSULIN REGULAR HUMAN 100 UNIT/ML IJ SOLN: INTRAMUSCULAR | 1 refills | Status: DC

## 2016-11-07 NOTE — Telephone Encounter (Signed)
Patient would like 90 days.

## 2016-11-07 NOTE — Telephone Encounter (Signed)
Pt called and stated that she picked up her NOVOLIN N 100 UNIT/ML injection and it was for only 30 days and not 90. Can we please resend it in for 90 days. Please advise, thank you!  Pharmacy - [id number] [place], [place]  Call pt @ [phone]

## 2016-11-07 NOTE — Telephone Encounter (Signed)
I believe the new prescription should cover for 90 days. Thanks.

## 2016-11-07 NOTE — Telephone Encounter (Signed)
Left message to return call 

## 2016-11-07 NOTE — Telephone Encounter (Signed)
Re-sent to pharmacy.

## 2016-11-07 NOTE — Telephone Encounter (Signed)
Patient states it is for the novolin R not N and she states she will not have enough for the R

## 2016-11-07 NOTE — Telephone Encounter (Signed)
Pt called back returning your call. Please advise, thank you!  Call pt @ [phone]

## 2016-11-08 ENCOUNTER — Encounter: Payer: Self-pay | Admitting: Family Medicine

## 2016-11-08 ENCOUNTER — Ambulatory Visit (INDEPENDENT_AMBULATORY_CARE_PROVIDER_SITE_OTHER): Payer: Medicare Other | Admitting: Family Medicine

## 2016-11-08 VITALS — BP 120/60 | HR 77 | Temp 98.5°F | Wt 279.4 lb

## 2016-11-08 DIAGNOSIS — F331 Major depressive disorder, recurrent, moderate: Secondary | ICD-10-CM

## 2016-11-08 DIAGNOSIS — I6523 Occlusion and stenosis of bilateral carotid arteries: Secondary | ICD-10-CM

## 2016-11-08 DIAGNOSIS — N183 Chronic kidney disease, stage 3 unspecified: Secondary | ICD-10-CM

## 2016-11-08 DIAGNOSIS — E1122 Type 2 diabetes mellitus with diabetic chronic kidney disease: Secondary | ICD-10-CM | POA: Diagnosis not present

## 2016-11-08 DIAGNOSIS — N950 Postmenopausal bleeding: Secondary | ICD-10-CM

## 2016-11-08 DIAGNOSIS — M7542 Impingement syndrome of left shoulder: Secondary | ICD-10-CM | POA: Insufficient documentation

## 2016-11-08 DIAGNOSIS — I10 Essential (primary) hypertension: Secondary | ICD-10-CM | POA: Diagnosis not present

## 2016-11-08 DIAGNOSIS — R600 Localized edema: Secondary | ICD-10-CM | POA: Diagnosis not present

## 2016-11-08 DIAGNOSIS — E1165 Type 2 diabetes mellitus with hyperglycemia: Secondary | ICD-10-CM

## 2016-11-08 DIAGNOSIS — Z794 Long term (current) use of insulin: Secondary | ICD-10-CM | POA: Diagnosis not present

## 2016-11-08 DIAGNOSIS — IMO0002 Reserved for concepts with insufficient information to code with codable children: Secondary | ICD-10-CM

## 2016-11-08 DIAGNOSIS — S46811A Strain of other muscles, fascia and tendons at shoulder and upper arm level, right arm, initial encounter: Secondary | ICD-10-CM

## 2016-11-08 NOTE — Patient Instructions (Signed)
Nice to see you. We'll have you return for fasting lab work. I would like for you to consider taking a medication for your depression. If you feel like you would like to do this please let us know. Please continue to monitor your blood pressure. Please monitor your neck and of the discomfort returns please let us know.

## 2016-11-08 NOTE — Assessment & Plan Note (Signed)
This has resolved. Possible strain from positioning during prior procedure. Advised to continue to monitor and if worsens or recurs letting us know.

## 2016-11-08 NOTE — Assessment & Plan Note (Signed)
Well-controlled on recheck. Continue current medications.

## 2016-11-08 NOTE — Assessment & Plan Note (Signed)
Mildly improved recently with the birth of a great great grandchild. No SI or HI. Hesitant to take any medications for this. She'll continue to monitor.

## 2016-11-08 NOTE — Assessment & Plan Note (Signed)
Remains uncontrolled. She's hesitant to change her medications. We'll have her return for an A1c and other fasting lab work.

## 2016-11-08 NOTE — Assessment & Plan Note (Signed)
Recent D&C. Following with gynecology. Benign findings. Monitor for recurrence.

## 2016-11-08 NOTE — Progress Notes (Signed)
Tommi Rumps, MD Phone: [phone]  Alexis Farmer is a 76 y.o. female who presents today for follow-up.  Diabetes: Notes her sugars have gone up to 300s though are typically in the 200s. She is using NPH and regular insulin. No polyuria. May be some polydipsia. Starts to feel as though she is getting hypoglycemic when she gets down to around 100. Drinks juice and this helps. No true hypoglycemia.  Depression: She does note depression. She feels unhappy. She just wants to be left alone at times. She does report she feels somewhat better today than she had been. She's not on medications. She notes no SI or HI. She does note some anxiety.  Hypertension: Typically in the 140s over 55s. She reports her nephrologist recently increased the frequency of her clonidine. She takes spironolactone, Demadex, and valsartan. No chest pain or shortness of breath. Notes chronic stable lower extremity edema. No orthopnea. Edema does go away with proper legs up.  She underwent D&C and had polyps removed. No cancer noted. Did note that her neck was hurting her after the procedure and she thinks this may be related to how she was laying on the table. Took a week or so to improve. No pain now.  PMH: nonsmoker.   ROS see history of present illness  Objective  Physical Exam Vitals:   11/08/16 1052 11/08/16 1116  BP: (!) 154/66 120/60  Pulse: 77   Temp: 98.5 F (36.9 C)     BP Readings from Last 3 Encounters:  11/08/16 120/60  10/26/16 (!) 137/50  10/23/16 (!) 145/52   Wt Readings from Last 3 Encounters:  11/08/16 279 lb 6.4 oz (126.7 kg)  10/26/16 278 lb (126.1 kg)  10/23/16 274 lb (124.3 kg)    Physical Exam  Constitutional: No distress.  Cardiovascular: Normal rate, regular rhythm and normal heart sounds.   Pulmonary/Chest: Effort normal and breath sounds normal.  Musculoskeletal:  Stable pedal extremity edema, no midline neck tenderness, no midline neck step-off, no muscular neck  tenderness, minimal trapezius tenderness on the right  Neurological: She is alert.  Skin: Skin is warm and dry. She is not diaphoretic.     Assessment/Plan: Please see individual problem list.  Essential hypertension, benign Well-controlled on recheck. Continue current medications.  Diabetes mellitus type II, uncontrolled ([place]) Remains uncontrolled. She's hesitant to change her medications. We'll have her return for an A1c and other fasting lab work.  Pedal edema Chronic pedal edema. Suspect venous insufficiency. No orthopnea or shortness of breath. Advised elevation of her legs. Consider compression stockings in the future.  Depression Mildly improved recently with the birth of a great great grandchild. No SI or HI. Hesitant to take any medications for this. She'll continue to monitor.  Postmenopausal bleeding Recent D&C. Following with gynecology. Benign findings. Monitor for recurrence.  Trapezius strain This has resolved. Possible strain from positioning during prior procedure. Advised to continue to monitor and if worsens or recurs letting us know.   Orders Placed This Encounter  Procedures  . Lipid panel    Standing Status:   Future    Standing Expiration Date:   11/08/2017  . HgB A1c    Standing Status:   Future    Standing Expiration Date:   11/08/2017  . Comp Met (CMET)    Standing Status:   Future    Standing Expiration Date:   11/08/2017  . TSH    Standing Status:   Future    Standing Expiration Date:   11/08/2017  Tommi Rumps, MD [place]

## 2016-11-08 NOTE — Assessment & Plan Note (Signed)
Chronic pedal edema. Suspect venous insufficiency. No orthopnea or shortness of breath. Advised elevation of her legs. Consider compression stockings in the future.

## 2016-11-13 ENCOUNTER — Encounter: Payer: Self-pay | Admitting: Podiatry

## 2016-11-13 ENCOUNTER — Ambulatory Visit (INDEPENDENT_AMBULATORY_CARE_PROVIDER_SITE_OTHER): Payer: Medicare Other | Admitting: Podiatry

## 2016-11-13 DIAGNOSIS — L309 Dermatitis, unspecified: Secondary | ICD-10-CM | POA: Diagnosis not present

## 2016-11-13 DIAGNOSIS — I6523 Occlusion and stenosis of bilateral carotid arteries: Secondary | ICD-10-CM | POA: Diagnosis not present

## 2016-11-13 NOTE — Progress Notes (Signed)
This patient presents the office with a concern on the bottom of her left heel. She says that she is not having much pain and discomfort today, but has noticed that there is peeling noted on the bottom of her left heel. She says she went to the nail salon 2 weeks ago and they identified the peeling and told her to be seen by her medical doctor since she is diabetic. She says it was initially painful a few weeks ago, but it has continually improved since that time. She presents the office today for an evaluation of her left heel  GENERAL APPEARANCE: Alert, conversant. Appropriately groomed. No acute distress.  VASCULAR: Pedal pulses are  palpable at  [facility] and PT bilateral.  Capillary refill time is immediate to all digits,  Normal temperature gradient.  Digital hair growth is present bilateral  NEUROLOGIC: sensation is normal to 5.07 monofilament at 5/5 sites bilateral.  Light touch is intact bilateral, Muscle strength normal.  MUSCULOSKELETAL: acceptable muscle strength, tone and stability bilateral.  Intrinsic muscluature intact bilateral.  Rectus appearance of foot and digits noted bilateral.   DERMATOLOGIC: skin color, texture, and turgor are within normal limits. There is peeling noted on the plantar aspect of the left heel. No evidence of any drainage or blister formation is present at this time. There does appear to be newly formed skin noted on the plantar lateral aspect left heel. There is the presence of 2 porokeratotic lesions noted on the plantar lateral aspect of the left heel  Digital nails are asymptomatic. No drainage noted.  Dermatitis  Porokeratosis  Left heel.  ROV.  Examination of her left heel. At this visit, reveals no evidence of any redness, swelling, drainage or infection. Minimal palpable pain is noted. I believe there was an inflammatory process that happened 2-3 weeks ago and this peeling could be a remnant of a blister or desquamation from the inflammation. She has continued to  improve for the last 2 weeks and I believe that she will continue to heal completely. She is return the office as needed   Gardiner Barefoot DPM

## 2016-11-22 ENCOUNTER — Encounter: Payer: Self-pay | Admitting: Family Medicine

## 2016-11-22 ENCOUNTER — Ambulatory Visit (INDEPENDENT_AMBULATORY_CARE_PROVIDER_SITE_OTHER): Payer: Medicare Other | Admitting: Family Medicine

## 2016-11-22 VITALS — BP 130/72 | HR 77 | Temp 99.5°F | Wt 282.2 lb

## 2016-11-22 DIAGNOSIS — N183 Chronic kidney disease, stage 3 unspecified: Secondary | ICD-10-CM

## 2016-11-22 DIAGNOSIS — M7542 Impingement syndrome of left shoulder: Secondary | ICD-10-CM | POA: Diagnosis not present

## 2016-11-22 DIAGNOSIS — Z794 Long term (current) use of insulin: Secondary | ICD-10-CM

## 2016-11-22 DIAGNOSIS — I6523 Occlusion and stenosis of bilateral carotid arteries: Secondary | ICD-10-CM | POA: Diagnosis not present

## 2016-11-22 DIAGNOSIS — E782 Mixed hyperlipidemia: Secondary | ICD-10-CM

## 2016-11-22 DIAGNOSIS — E1165 Type 2 diabetes mellitus with hyperglycemia: Secondary | ICD-10-CM

## 2016-11-22 DIAGNOSIS — I10 Essential (primary) hypertension: Secondary | ICD-10-CM | POA: Diagnosis not present

## 2016-11-22 DIAGNOSIS — M25511 Pain in right shoulder: Secondary | ICD-10-CM | POA: Diagnosis not present

## 2016-11-22 DIAGNOSIS — R600 Localized edema: Secondary | ICD-10-CM | POA: Diagnosis not present

## 2016-11-22 DIAGNOSIS — R234 Changes in skin texture: Secondary | ICD-10-CM | POA: Diagnosis not present

## 2016-11-22 DIAGNOSIS — E1122 Type 2 diabetes mellitus with diabetic chronic kidney disease: Secondary | ICD-10-CM | POA: Diagnosis not present

## 2016-11-22 DIAGNOSIS — IMO0002 Reserved for concepts with insufficient information to code with codable children: Secondary | ICD-10-CM

## 2016-11-22 LAB — TSH: TSH: 2.66 u[IU]/mL (ref 0.35–4.50)

## 2016-11-22 LAB — COMPREHENSIVE METABOLIC PANEL
ALBUMIN: 4.3 g/dL (ref 3.5–5.2)
ALT: 23 U/L (ref 0–35)
AST: 23 U/L (ref 0–37)
Alkaline Phosphatase: 44 U/L (ref 39–117)
BILIRUBIN TOTAL: 0.6 mg/dL (ref 0.2–1.2)
BUN: 18 mg/dL (ref 6–23)
CALCIUM: 10.2 mg/dL (ref 8.4–10.5)
CO2: 35 meq/L — AB (ref 19–32)
CREATININE: 1.22 mg/dL — AB (ref 0.40–1.20)
Chloride: 101 mEq/L (ref 96–112)
GFR: 54.84 mL/min — ABNORMAL LOW (ref >60.00)
Glucose, Bld: 206 mg/dL — ABNORMAL HIGH (ref 70–99)
Potassium: 4.7 mEq/L (ref 3.5–5.1)
Sodium: 141 mEq/L (ref 135–145)
TOTAL PROTEIN: 7.5 g/dL (ref 6.0–8.3)

## 2016-11-22 LAB — LIPID PANEL
Cholesterol: 129 mg/dL (ref 0–200)
HDL: 45.2 mg/dL (ref >39.00)
LDL Cholesterol: 48 mg/dL (ref 0–99)
NonHDL: 83.48
TRIGLYCERIDES: 176 mg/dL — AB (ref 0.0–149.0)
Total CHOL/HDL Ratio: 3
VLDL: 35.2 mg/dL (ref 0.0–40.0)

## 2016-11-22 LAB — HEMOGLOBIN A1C: Hgb A1c MFr Bld: 10 % — ABNORMAL HIGH (ref 4.6–6.5)

## 2016-11-22 NOTE — Patient Instructions (Addendum)
Nice to see you. Please monitor the skin peeling on your left heel. If this worsens or you develop a crack in the skin or redness please let us know. We'll give you some exercises to do for your shoulder. We'll get you referred to sports medicine. Please hold Crestor and see if the shakes that you have gotten get better. Please contact us next week to see if they have improved.   Shoulder Exercises Ask your health care provider which exercises are safe for you. Do exercises exactly as told by your health care provider and adjust them as directed. It is normal to feel mild stretching, pulling, tightness, or discomfort as you do these exercises, but you should stop right away if you feel sudden pain or your pain gets worse.Do not begin these exercises until told by your health care provider. RANGE OF MOTION EXERCISES These exercises warm up your muscles and joints and improve the movement and flexibility of your shoulder. These exercises also help to relieve pain, numbness, and tingling. These exercises involve stretching your injured shoulder directly. Exercise A: Pendulum  1. Stand near a wall or a surface that you can hold onto for balance. 2. Bend at the waist and let your left / right arm hang straight down. Use your other arm to support you. Keep your back straight and do not lock your knees. 3. Relax your left / right arm and shoulder muscles, and move your hips and your trunk so your left / right arm swings freely. Your arm should swing because of the motion of your body, not because you are using your arm or shoulder muscles. 4. Keep moving your body so your arm swings in the following directions, as told by your health care provider: ? Side to side. ? Forward and backward. ? In clockwise and counterclockwise circles. 5. Continue each motion for __________ seconds, or for as long as told by your health care provider. 6. Slowly return to the starting position. Repeat __________ times.  Complete this exercise __________ times a day. Exercise B:Flexion, Standing  1. Stand and hold a broomstick, a cane, or a similar object. Place your hands a little more than shoulder-width apart on the object. Your left / right hand should be palm-up, and your other hand should be palm-down. 2. Keep your elbow straight and keep your shoulder muscles relaxed. Push the stick down with your healthy arm to raise your left / right arm in front of your body, and then over your head until you feel a stretch in your shoulder. ? Avoid shrugging your shoulder while you raise your arm. Keep your shoulder blade tucked down toward the middle of your back. 3. Hold for __________ seconds. 4. Slowly return to the starting position. Repeat __________ times. Complete this exercise __________ times a day. Exercise C: Abduction, Standing 1. Stand and hold a broomstick, a cane, or a similar object. Place your hands a little more than shoulder-width apart on the object. Your left / right hand should be palm-up, and your other hand should be palm-down. 2. While keeping your elbow straight and your shoulder muscles relaxed, push the stick across your body toward your left / right side. Raise your left / right arm to the side of your body and then over your head until you feel a stretch in your shoulder. ? Do not raise your arm above shoulder height, unless your health care provider tells you to do that. ? Avoid shrugging your shoulder while you raise  your arm. Keep your shoulder blade tucked down toward the middle of your back. 3. Hold for __________ seconds. 4. Slowly return to the starting position. Repeat __________ times. Complete this exercise __________ times a day. Exercise D:Internal Rotation  1. Place your left / right hand behind your back, palm-up. 2. Use your other hand to dangle an exercise band, a towel, or a similar object over your shoulder. Grasp the band with your left / right hand so you are holding  onto both ends. 3. Gently pull up on the band until you feel a stretch in the front of your left / right shoulder. ? Avoid shrugging your shoulder while you raise your arm. Keep your shoulder blade tucked down toward the middle of your back. 4. Hold for __________ seconds. 5. Release the stretch by letting go of the band and lowering your hands. Repeat __________ times. Complete this exercise __________ times a day. STRETCHING EXERCISES These exercises warm up your muscles and joints and improve the movement and flexibility of your shoulder. These exercises also help to relieve pain, numbness, and tingling. These exercises are done using your healthy shoulder to help stretch the muscles of your injured shoulder. Exercise E: Warehouse manager (External Rotation and Abduction)  1. Stand in a doorway with one of your feet slightly in front of the other. This is called a staggered stance. If you cannot reach your forearms to the door frame, stand facing a corner of a room. 2. Choose one of the following positions as told by your health care provider: ? Place your hands and forearms on the door frame above your head. ? Place your hands and forearms on the door frame at the height of your head. ? Place your hands on the door frame at the height of your elbows. 3. Slowly move your weight onto your front foot until you feel a stretch across your chest and in the front of your shoulders. Keep your head and chest upright and keep your abdominal muscles tight. 4. Hold for __________ seconds. 5. To release the stretch, shift your weight to your back foot. Repeat __________ times. Complete this stretch __________ times a day. Exercise F:Extension, Standing 1. Stand and hold a broomstick, a cane, or a similar object behind your back. ? Your hands should be a little wider than shoulder-width apart. ? Your palms should face away from your back. 2. Keeping your elbows straight and keeping your shoulder muscles  relaxed, move the stick away from your body until you feel a stretch in your shoulder. ? Avoid shrugging your shoulders while you move the stick. Keep your shoulder blade tucked down toward the middle of your back. 3. Hold for __________ seconds. 4. Slowly return to the starting position. Repeat __________ times. Complete this exercise __________ times a day. STRENGTHENING EXERCISES These exercises build strength and endurance in your shoulder. Endurance is the ability to use your muscles for a long time, even after they get tired. Exercise G:External Rotation  1. Sit in a stable chair without armrests. 2. Secure an exercise band at elbow height on your left / right side. 3. Place a soft object, such as a folded towel or a small pillow, between your left / right upper arm and your body to move your elbow a few inches away (about 10 cm) from your side. 4. Hold the end of the band so it is tight and there is no slack. 5. Keeping your elbow pressed against the soft object, move  your left / right forearm out, away from your abdomen. Keep your body steady so only your forearm moves. 6. Hold for __________ seconds. 7. Slowly return to the starting position. Repeat __________ times. Complete this exercise __________ times a day. Exercise H:Shoulder Abduction  1. Sit in a stable chair without armrests, or stand. 2. Hold a __________ weight in your left / right hand, or hold an exercise band with both hands. 3. Start with your arms straight down and your left / right palm facing in, toward your body. 4. Slowly lift your left / right hand out to your side. Do not lift your hand above shoulder height unless your health care provider tells you that this is safe. ? Keep your arms straight. ? Avoid shrugging your shoulder while you do this movement. Keep your shoulder blade tucked down toward the middle of your back. 5. Hold for __________ seconds. 6. Slowly lower your arm, and return to the starting  position. Repeat __________ times. Complete this exercise __________ times a day. Exercise I:Shoulder Extension 1. Sit in a stable chair without armrests, or stand. 2. Secure an exercise band to a stable object in front of you where it is at shoulder height. 3. Hold one end of the exercise band in each hand. Your palms should face each other. 4. Straighten your elbows and lift your hands up to shoulder height. 5. Step back, away from the secured end of the exercise band, until the band is tight and there is no slack. 6. Squeeze your shoulder blades together as you pull your hands down to the sides of your thighs. Stop when your hands are straight down by your sides. Do not let your hands go behind your body. 7. Hold for __________ seconds. 8. Slowly return to the starting position. Repeat __________ times. Complete this exercise __________ times a day. Exercise J:Standing Shoulder Row 1. Sit in a stable chair without armrests, or stand. 2. Secure an exercise band to a stable object in front of you so it is at waist height. 3. Hold one end of the exercise band in each hand. Your palms should be in a thumbs-up position. 4. Bend each of your elbows to an "L" shape (about 90 degrees) and keep your upper arms at your sides. 5. Step back until the band is tight and there is no slack. 6. Slowly pull your elbows back behind you. 7. Hold for __________ seconds. 8. Slowly return to the starting position. Repeat __________ times. Complete this exercise __________ times a day. Exercise K:Shoulder Press-Ups  1. Sit in a stable chair that has armrests. Sit upright, with your feet flat on the floor. 2. Put your hands on the armrests so your elbows are bent and your fingers are pointing forward. Your hands should be about even with the sides of your body. 3. Push down on the armrests and use your arms to lift yourself off of the chair. Straighten your elbows and lift yourself up as much as you  comfortably can. ? Move your shoulder blades down, and avoid letting your shoulders move up toward your ears. ? Keep your feet on the ground. As you get stronger, your feet should support less of your body weight as you lift yourself up. 4. Hold for __________ seconds. 5. Slowly lower yourself back into the chair. Repeat __________ times. Complete this exercise __________ times a day. Exercise L: Wall Push-Ups  1. Stand so you are facing a stable wall. Your feet should be  about one arm-length away from the wall. 2. Lean forward and place your palms on the wall at shoulder height. 3. Keep your feet flat on the floor as you bend your elbows and lean forward toward the wall. 4. Hold for __________ seconds. 5. Straighten your elbows to push yourself back to the starting position. Repeat __________ times. Complete this exercise __________ times a day. This information is not intended to replace advice given to you by your health care provider. Make sure you discuss any questions you have with your health care provider. Document Released: 04/18/2005 Document Revised: 02/27/2016 Document Reviewed: 02/13/2015 Elsevier Interactive Patient Education  2018 [employer].

## 2016-11-22 NOTE — Assessment & Plan Note (Signed)
Based on exam suspect rotator cuff impingement. Discussed exercises. Will refer to sports medicine and consider ultrasound.

## 2016-11-22 NOTE — Assessment & Plan Note (Signed)
At goal on recheck. Continue current medications.

## 2016-11-22 NOTE — Assessment & Plan Note (Signed)
Small area of skin peeling over left heel. No evidence of fissures or infection. Reassured patient. Discussed Epsom salt soaks. She'll have somebody monitor her feet as well. If worsens or develops any signs of infection or skin breakdown they will contact us.

## 2016-11-22 NOTE — Progress Notes (Signed)
  Tommi Rumps, MD Phone: [phone]  Alexis Farmer is a 76 y.o. female who presents today for follow-up.  Patient notes over the last several weeks she's had some peeling of her skin on her left heel. Does note some pain though she was evaluated by orthopedics and advise she had a bone spur on her left heel. They advised her to soak her feet. She does note she had a pedicure though they did not scrape at this area. There's been no drainage. There is no break in the skin. There is no erythema. She is taking her fluid pills.  Right shoulder pain: Continues to have issues with this. It's just in the shoulder now. Occurs if she abducts, internally rotates, or externally rotates her shoulder. Ibuprofen has not been beneficial. Does not radiate down her arm.  Hypertension: Not checking her blood pressure. No chest pain or shortness of breath. She is taking her medications.  She reports since going on the Crestor she's had the occasional time where she will have a brief shake that goes away right away. She had similar episodes with Lipitor in the past. They resolved with stopping Lipitor.  PMH: nonsmoker.   ROS see history of present illness  Objective  Physical Exam Vitals:   11/22/16 0937 11/22/16 1157  BP: (!) 184/90 130/72  Pulse: 77   Temp: 99.5 F (37.5 C)     BP Readings from Last 3 Encounters:  11/22/16 130/72  11/08/16 120/60  10/26/16 (!) 137/50   Wt Readings from Last 3 Encounters:  11/22/16 282 lb 3.2 oz (128 kg)  11/08/16 279 lb 6.4 oz (126.7 kg)  10/26/16 278 lb (126.1 kg)    Physical Exam  Constitutional: No distress.  Cardiovascular: Normal rate, regular rhythm and normal heart sounds.   Pulmonary/Chest: Effort normal and breath sounds normal.  Musculoskeletal: She exhibits edema (trace edema).  Bilateral shoulders nontender, right shoulder with discomfort on active and passive external and internal rotation as well as abduction, positive empty can sign,  negative speeds, left shoulder with no discomfort on active or passive range of motion, negative empty can and speeds  Neurological: She is alert.  Skin: Skin is warm and dry. She is not diaphoretic.  Mild skin peeling over left heel, no evidence of skin breakdown or fissures, no erythema, no tenderness, no warmth     Assessment/Plan: Please see individual problem list.  Essential hypertension, benign At goal on recheck. Continue current medications.  Rotator cuff impingement syndrome, left Based on exam suspect rotator cuff impingement. Discussed exercises. Will refer to sports medicine and consider ultrasound.  Peeling skin Small area of skin peeling over left heel. No evidence of fissures or infection. Reassured patient. Discussed Epsom salt soaks. She'll have somebody monitor her feet as well. If worsens or develops any signs of infection or skin breakdown they will contact us.  Hyperlipidemia Discussed holding Crestor given issues with shakes in the past on Lipitor. She'll contact us next week and let us know if they have resolved with discontinuing this. If they persist she will stay on Crestor. If they stop we'll try another statin.   Orders Placed This Encounter  Procedures  . Ambulatory referral to Sports Medicine    Referral Priority:   Routine    Referral Type:   Consultation    Number of Visits Requested:   [place], MD [place]

## 2016-11-22 NOTE — Assessment & Plan Note (Signed)
Discussed holding Crestor given issues with shakes in the past on Lipitor. She'll contact us next week and let us know if they have resolved with discontinuing this. If they persist she will stay on Crestor. If they stop we'll try another statin.

## 2016-11-24 NOTE — Progress Notes (Signed)
Cardiology Office Note  Date:  11/26/2016   ID:  Alexis Farmer, DOB 21-Sep-1940, MRN [id number]  PCP:  Alexis Haven, MD   Chief Complaint  Patient presents with  . other    6 month follow up. Patient c/o leg pain and being able to hear her heart beat. Meds reviewed verbally with patient.     HPI:  Alexis Farmer is a 76 year old woman with history of  obesity,  insulin-dependent diabetes, Hemoglobin A1c  10, poor diet, hypertension,  hyperlipidemia,  moderate LVH  hospital 08/13/2014  chest pain, malignant hypertension, possible sleep apnea,  chronic kidney disease,  Anxiety Notes indicate previous history of GI bleeding  colonoscopy which was normal by her report She presents to for follow-up of her hypertension  10/2016  D&C, hysteroscopy. Postmenopausal bleeding.  Alexis Farmer will  Was in hospital 07/2016 outside the system Could not see, nausea, everything looked "white" Was told heart rate was low, Metoprolol was stopped Hospital: [facility], [place]  Leg swelling Lab work from this past week reviewed with her in detail HBA1C 10 Total chol 129, LDL 48  Long discussion concerning her blood pressure Elevated today as she was concerned there was no assisted parking to get into our office today She is having some leg swelling Taking torsemide once a day Was told by Dr. Candiss Norse she could take extra torsemide as needed for shortness of breath and swelling  No regular exercise Feels a thumping in her chest at times when she bends over Also sometimes when she goes to bed at night  EKG on today's visit shows normal sinus rhythm with rate 71 bpm, poor R-wave progression through the anterior precordial leads, left axis deviation APCs noted  Previous Echocardiogram 08/13/2014 showed normal ejection fraction, normal RV size and function, moderate LVH  Other past medical history Renal artery duplex showing no blockage of the mid to distal vessels bilaterally, challenging  study   PMH:   has a past medical history of Arthritis; Chickenpox; Diabetes mellitus without complication ([place]); Diverticulitis; GI bleed; High cholesterol; History of blood transfusion; Hypertension; and Renal insufficiency.  PSH:    Past Surgical History:  Procedure Laterality Date  . APPENDECTOMY    . ECTOPIC PREGNANCY SURGERY    . EYE SURGERY     bilateral cataracts  . gallbladder     . HYSTEROSCOPY W/D&C N/A 10/26/2016   Procedure: DILATATION AND CURETTAGE /HYSTEROSCOPY;  Surgeon: Benjaman Kindler, MD;  Location: [facility] ORS;  Service: Gynecology;  Laterality: N/A;  . THYROIDECTOMY, PARTIAL      Current Outpatient Prescriptions  Medication Sig Dispense Refill  . ALPRAZolam (XANAX) 0.5 MG tablet TAKE 1 TABLET BY MOUTH AT BEDTIME AS NEEDED FOR SLEEP 30 tablet 0  . B-D INS SYRINGE 0.5CC/30GX1/2" 30G X 1/2" 0.5 ML MISC USE AS DIRECTED 500 each 1  . cloNIDine (CATAPRES) 0.2 MG tablet TAKE 1 TABLET (0.2 MG TOTAL) BY MOUTH 2 (TWO) TIMES DAILY. (Patient taking differently: Take 0.2 mg by mouth 3 (three) times daily. ) 180 tablet 3  . insulin NPH Human (NOVOLIN N) 100 UNIT/ML injection INJECT 33 UNITS INTO THE SKIN IN THE MORNING AND 20 UNITS IN THE EVENING DAILY BEFORE A MEAL 50 mL 1  . insulin regular (NOVOLIN R) 100 units/mL injection INJECT 15 UNITS TOTAL INTO THE SKIN 3 (THREE) TIMES DAILY BEFORE MEALS. 60 mL 1  . Multiple Vitamin (MULTIVITAMIN WITH MINERALS) TABS tablet Take 1 tablet by mouth daily.    . polyethylene glycol powder (GLYCOLAX/MIRALAX) powder  TAKE 17 G BY MOUTH DAILY. 1581 g 0  . spironolactone (ALDACTONE) 25 MG tablet TAKE 1 TABLET (25 MG TOTAL) BY MOUTH DAILY. 90 tablet 3  . torsemide (DEMADEX) 20 MG tablet Take 1 tablet (20 mg total) by mouth 2 (two) times daily. 180 tablet 3  . valsartan (DIOVAN) 320 MG tablet TAKE 1 TABLET (320 MG TOTAL) BY MOUTH DAILY. 90 tablet 3   No current facility-administered medications for this visit.      Allergies:   Penicillins    Social History:  The patient  reports that she has never smoked. She has never used smokeless tobacco. She reports that she does not drink alcohol or use drugs.   Family History:   family history includes Diabetes in her mother, other, and sister; Heart disease in her sister; Hypertension in her mother; Stroke in her mother.    Review of Systems: Review of Systems  Constitutional: Negative.   Respiratory: Negative.   Cardiovascular: Positive for palpitations and leg swelling.  Gastrointestinal: Negative.   Musculoskeletal: Negative.        Gait instability  Neurological: Negative.   Psychiatric/Behavioral: Negative.   All other systems reviewed and are negative.    PHYSICAL EXAM: VS:  BP (!) 150/70 (BP Location: Right Arm, Patient Position: Sitting, Cuff Size: Normal)   Pulse 71   Ht 5\' 3"  (1.6 m)   Wt 281 lb 4 oz (127.6 kg)   BMI 49.82 kg/m  , BMI Body mass index is 49.82 kg/m. GEN: Well nourished, well developed, in no acute distress, obese, presenting a wheelchair HEENT: normal  Neck: no JVD, carotid bruits, or masses Cardiac: RRR; no murmurs, rubs, or gallops, trace  pitting edema bilaterally to the mid shins Respiratory:  clear to auscultation bilaterally, normal work of breathing GI: soft, nontender, nondistended, + BS MS: no deformity or atrophy  Skin: warm and dry, no rash Neuro:  Strength and sensation are intact Psych: euthymic mood, full affect    Recent Labs: 10/23/2016: Hemoglobin 13.1; Platelets 231 11/22/2016: ALT 23; BUN 18; Creatinine, Ser 1.22; Potassium 4.7; Sodium 141; TSH 2.66    Lipid Panel Lab Results  Component Value Date   CHOL 129 11/22/2016   HDL 45.20 11/22/2016   LDLCALC 48 11/22/2016   TRIG 176.0 (H) 11/22/2016      Wt Readings from Last 3 Encounters:  11/26/16 281 lb 4 oz (127.6 kg)  11/22/16 282 lb 3.2 oz (128 kg)  11/08/16 279 lb 6.4 oz (126.7 kg)       ASSESSMENT AND PLAN:  Essential hypertension, benign - Plan: EKG  12-Lead Long discussion concerning her blood pressure Elevated today in the setting of stress about parking otherwise she reports is well controlled Recommended she call us of blood pressure runs high on a consistent basis  Mixed hyperlipidemia Cholesterol is at goal on the current lipid regimen. No changes to the medications were made.  Uncontrolled type 2 diabetes mellitus with stage 3 chronic kidney disease, with long-term current use of insulin ([place]) We have encouraged continued careful diet management in an effort to lose weight. Long discussion concerning diet. Dietary guide provided  CKD (chronic kidney disease) stage 3, GFR 30-59 ml/min Sees Dr. Candiss Norse On torsemide daily Recommended she take extra torsemide in the afternoon sparingly for leg swelling  Morbid obesity ([place]) Unable to exercise. Obesity hypoventilation   Chronic diastolic CHF (congestive heart failure) ([place]) Recommended she take extra torsemide as needed for shortness of breath, ankle swelling  Total encounter time more than 25 minutes  Greater than 50% was spent in counseling and coordination of care with the patient   Disposition:   F/U  6 months   Orders Placed This Encounter  Procedures  . EKG 12-Lead     Signed, Esmond Plants, M.D., Ph.D. 11/26/2016  [place], [place]

## 2016-11-26 ENCOUNTER — Encounter: Payer: Self-pay | Admitting: Cardiovascular Disease

## 2016-11-26 ENCOUNTER — Ambulatory Visit (INDEPENDENT_AMBULATORY_CARE_PROVIDER_SITE_OTHER): Payer: Medicare Other | Admitting: Cardiovascular Disease

## 2016-11-26 VITALS — BP 150/70 | HR 71 | Ht 63.0 in | Wt 281.2 lb

## 2016-11-26 DIAGNOSIS — I10 Essential (primary) hypertension: Secondary | ICD-10-CM | POA: Diagnosis not present

## 2016-11-26 DIAGNOSIS — I6523 Occlusion and stenosis of bilateral carotid arteries: Secondary | ICD-10-CM | POA: Diagnosis not present

## 2016-11-26 DIAGNOSIS — E1165 Type 2 diabetes mellitus with hyperglycemia: Secondary | ICD-10-CM

## 2016-11-26 DIAGNOSIS — N183 Chronic kidney disease, stage 3 unspecified: Secondary | ICD-10-CM

## 2016-11-26 DIAGNOSIS — I5032 Chronic diastolic (congestive) heart failure: Secondary | ICD-10-CM

## 2016-11-26 DIAGNOSIS — IMO0002 Reserved for concepts with insufficient information to code with codable children: Secondary | ICD-10-CM

## 2016-11-26 DIAGNOSIS — E782 Mixed hyperlipidemia: Secondary | ICD-10-CM | POA: Diagnosis not present

## 2016-11-26 DIAGNOSIS — E1122 Type 2 diabetes mellitus with diabetic chronic kidney disease: Secondary | ICD-10-CM

## 2016-11-26 DIAGNOSIS — Z794 Long term (current) use of insulin: Secondary | ICD-10-CM

## 2016-11-26 MED ORDER — TORSEMIDE 20 MG PO TABS: 20.0000 mg | ORAL_TABLET | Freq: Two times a day (BID) | ORAL | 3 refills | Status: DC

## 2016-11-26 NOTE — Progress Notes (Signed)
Left voice mail to call back 

## 2016-11-26 NOTE — Patient Instructions (Signed)

## 2016-11-27 ENCOUNTER — Ambulatory Visit: Payer: Medicare Other | Admitting: Sports Medicine

## 2016-11-28 ENCOUNTER — Telehealth: Payer: Self-pay | Admitting: Family Medicine

## 2016-11-28 NOTE — Telephone Encounter (Signed)
Please advise 

## 2016-11-28 NOTE — Telephone Encounter (Signed)
Patient states at her lab draw she provided a urine and had asked the lab tech to see if you would run her urine. Informed patient we were not aware of this. She states she will call back if she has any symptoms.

## 2016-11-28 NOTE — Telephone Encounter (Signed)
fyi

## 2016-11-28 NOTE — Telephone Encounter (Signed)
Noted  

## 2016-11-28 NOTE — Telephone Encounter (Signed)
Pt called and was wondering if Dr. Caryl Bis approved the urine culture. Please advise, thank you!  Call pt @ [phone]

## 2016-11-28 NOTE — Telephone Encounter (Signed)
Unfortunately I do not know what this is regarding. Please contact the patient to see what this is about. I do not believe we discussed any urinary issues at her last visit.

## 2016-11-29 ENCOUNTER — Ambulatory Visit: Payer: Medicare Other | Admitting: Sports Medicine

## 2016-12-03 ENCOUNTER — Telehealth: Payer: Self-pay | Admitting: *Deleted

## 2016-12-03 NOTE — Telephone Encounter (Signed)
Patient has requested to have a  Rx for a shower chair with handles, and a elevated toilet seat with bars. Patient stated that it is now hard to shower with out something to hold on to, and also hard to use the toilet, due to it being to low to raise up .   Pt contact [phone]

## 2016-12-03 NOTE — Telephone Encounter (Signed)
Please advise 

## 2016-12-05 ENCOUNTER — Ambulatory Visit: Payer: Medicare Other | Admitting: Sports Medicine

## 2016-12-05 NOTE — Telephone Encounter (Signed)
Paper prescription completed. Please provide to the patient to pick up to take to DME store.

## 2016-12-06 NOTE — Telephone Encounter (Signed)
Left message to return call 

## 2016-12-06 NOTE — Telephone Encounter (Signed)
Patient notified

## 2016-12-14 ENCOUNTER — Encounter: Payer: Self-pay | Admitting: Sports Medicine

## 2016-12-14 ENCOUNTER — Ambulatory Visit (INDEPENDENT_AMBULATORY_CARE_PROVIDER_SITE_OTHER): Payer: Medicare Other | Admitting: Sports Medicine

## 2016-12-14 ENCOUNTER — Ambulatory Visit: Payer: Medicare Other

## 2016-12-14 ENCOUNTER — Ambulatory Visit (INDEPENDENT_AMBULATORY_CARE_PROVIDER_SITE_OTHER): Payer: Medicare Other

## 2016-12-14 VITALS — BP 140/80 | HR 66 | Ht 63.0 in | Wt 277.4 lb

## 2016-12-14 DIAGNOSIS — M542 Cervicalgia: Secondary | ICD-10-CM | POA: Diagnosis not present

## 2016-12-14 DIAGNOSIS — I6523 Occlusion and stenosis of bilateral carotid arteries: Secondary | ICD-10-CM

## 2016-12-14 DIAGNOSIS — M549 Dorsalgia, unspecified: Secondary | ICD-10-CM

## 2016-12-14 DIAGNOSIS — E1142 Type 2 diabetes mellitus with diabetic polyneuropathy: Secondary | ICD-10-CM | POA: Diagnosis not present

## 2016-12-14 DIAGNOSIS — M4722 Other spondylosis with radiculopathy, cervical region: Secondary | ICD-10-CM | POA: Diagnosis not present

## 2016-12-14 MED ORDER — GABAPENTIN 100 MG PO CAPS: ORAL_CAPSULE | ORAL | 1 refills | Status: DC

## 2016-12-14 NOTE — Progress Notes (Signed)
OFFICE VISIT NOTE Alexis Farmer, [name] at [facility] [phone]  Alexis Farmer - 76 y.o. female MRN [id number]  Date of birth: 01-27-1941  Visit Date: 12/14/2016  PCP: Alexis Haven, MD   Referred by: Alexis Haven, MD  Alexis Farmer [address] cma acting as scribe for Dr. Paulla Fore.  SUBJECTIVE:   Chief Complaint  Patient presents with  . New Patient (Initial Visit)  . Right Shoulder Pain   HPI: As below and per problem based documentation when appropriate.   Alexis Farmer reports RT neck pain that radiates to posterior RT shoulder blade to right arm. Pain stops at at elbow. Describes the pain as a "tooth ache" that is constant. She is unable to lay on her right side. Sx are worse at night. Sx started over 1 month ago. Took Ibuprofen 800mg  with no relief. No prior imaging. Pt has been exercising the area with no relief. Tried using heat/ice with no relief.     Review of Systems  Constitutional: Negative for chills, diaphoresis, fever, malaise/fatigue and weight loss.  HENT: Negative.   Eyes: Negative.   Respiratory: Negative.   Cardiovascular: Negative.   Gastrointestinal: Negative.   Genitourinary: Negative.   Musculoskeletal: Positive for joint pain and myalgias.  Skin: Negative.   Neurological: Positive for tingling. Negative for dizziness, tremors, sensory change, speech change, focal weakness, seizures, loss of consciousness, weakness and headaches.  Endo/Heme/Allergies: Negative.   Psychiatric/Behavioral: Negative.     Otherwise per HPI.  HISTORY & PERTINENT PRIOR DATA:  No specialty comments available. She reports that she has never smoked. She has never used smokeless tobacco.   Recent Labs  07/18/16 1103 11/22/16 1013  HGBA1C 9.2* 10.0*   Medications & Allergies reviewed per EMR Patient Active Problem List   Diagnosis Date Noted  . Cervical spondylosis with radiculopathy 01/26/2017  . Cerumen impaction  01/21/2017  . Subconjunctival hemorrhage 01/21/2017  . Peeling skin 11/22/2016  . Rotator cuff impingement syndrome, left 11/08/2016  . UTI (urinary tract infection) 09/24/2016  . Rectal bleed 07/29/2016  . Carotid stenosis 07/19/2016  . Chronic venous insufficiency 07/19/2016  . Pain and swelling of lower leg 07/19/2016  . Bradycardia 07/17/2016  . Postmenopausal bleeding 07/17/2016  . Chronic diastolic CHF (congestive heart failure) ([place]) 05/31/2016  . Cough 01/25/2016  . Chronic fatigue 11/15/2015  . Depression 10/13/2015  . Osteoarthritis 10/13/2015  . Vertigo 10/13/2015  . Anxiety 09/13/2015  . GI bleed 08/03/2015  . Mixed hyperlipidemia 08/26/2014  . Essential hypertension, benign 04/08/2014  . Diabetes mellitus type II, uncontrolled ([place]) 04/08/2014  . CKD (chronic kidney disease) stage 3, GFR 30-59 ml/min 04/08/2014  . Pedal edema 04/08/2014  . Morbid obesity ([place]) 04/08/2014  . Lower back pain 04/08/2014  . Diabetic polyneuropathy ([place]) 04/08/2014   Past Medical History:  Diagnosis Date  . Arthritis   . Chickenpox   . Diabetes mellitus without complication ([place])    one elevated reading/ no treatment  . Diverticulitis   . GI bleed   . High cholesterol   . History of blood transfusion   . Hypertension   . Renal insufficiency    Family History  Problem Relation Age of Onset  . Diabetes Mother   . Hypertension Mother   . Stroke Mother   . Diabetes Other   . Diabetes Sister   . Heart disease Sister    Past Surgical History:  Procedure Laterality Date  . APPENDECTOMY    . ECTOPIC  PREGNANCY SURGERY    . EYE SURGERY     bilateral cataracts  . gallbladder     . HYSTEROSCOPY W/D&C N/A 10/26/2016   Procedure: DILATATION AND CURETTAGE /HYSTEROSCOPY;  Surgeon: Benjaman Kindler, MD;  Location: [facility] ORS;  Service: Gynecology;  Laterality: N/A;  . THYROIDECTOMY, PARTIAL     Social History   Occupational History  . Not on file.   Social History Main Topics  .  Smoking status: Never Smoker  . Smokeless tobacco: Never Used  . Alcohol use No  . Drug use: No  . Sexual activity: Not on file    OBJECTIVE:  VS:  HT:5\' 3"  (160 cm)   WT:277 lb 6.4 oz (125.8 kg)  BMI:49.2    BP:140/80  HR:66bpm  TEMP: ( )  RESP:97 % EXAM: Findings:  WDWN, NAD, Non-toxic appearing Alert & appropriately interactive Not depressed or anxious appearing No increased work of breathing. Pupils are equal. EOM intact without nystagmus No clubbing or cyanosis of the extremities appreciated No significant rashes/lesions/ulcerations overlying the examined area. Radial pulses 2+/4.  No significant generalized UE edema. Generalized dysesthesia of the entire right upper extremity in a nondermatomal distribution.   Right Shoulder Exam: Normal alignment, Normal Contours No overlying erythema/ecchymosis. No pain with only minimal crepitation with axial loading and circumduction No TTP over: Bony landmarks Full unrestricted range of motion passively Empty can: Small amount of pain with preserved strength Hawkins: No focal pain Neers: Mild amount of pain Speeds: Mild pain nonfocal O'Brien's: Normal  Neck: Limited cervical side bending and rotation.  Pain with Spurling's compression test on the right.  Pain with brachial plexus squeeze on the right.  Pain with arm squeeze test.  Upper extremity reflexes are symmetric.  Grip strength and upper extremity myotomes are intact.     X-rays reveal mild OA of the shoulder and AC joint.  Cervical spine reveals marked degenerative changes diffusely of the uncovertebral joints.  Some carotid atherosclerosis.  ASSESSMENT & PLAN:     ICD-10-CM   1. Diabetic polyneuropathy associated with type 2 diabetes mellitus (HCC) E11.42   2. Back pain, unspecified back location, unspecified back pain laterality, unspecified chronicity M54.9 DG Shoulder Right    CANCELED: DG Lumbar Spine 2-3 Views  3. Neck pain M54.2 DG Cervical Spine 2 or 3  views    CANCELED: DG Cervical Spine 2 or 3 views  4. Cervical spondylosis with radiculopathy M47.22   ================================================================= Cervical spondylosis with radiculopathy >50% of this 30 minute visit spent in direct patient counseling and/or coordination of care.  Discussion was focused on education regarding the in discussing the pathoetiology and anticipated clinical course of the above condition.  Ultimately discuss the merits of gabapentinoids for cervical radiculitis as well as diabetic neuropathy.  Low-dose prescription provided today and if any lack of improvement can consider further diagnostic evaluation with MRI of the cervical spine.  Shoulder exam does seem to be nonfocal and I suspect some underlying degenerative change with this appears to be more neurogenic in nature. ================================================================= There are no Patient Instructions on file for this visit.=================================================================  Follow-up: Return in about 6 weeks (around 01/25/2017).   CMA/ATC served as Education administrator during this visit. History, Physical, and Plan performed by medical provider. Documentation and orders reviewed and attested to.      Teresa Coombs, [place] [facility] Physician

## 2017-01-08 ENCOUNTER — Ambulatory Visit (INDEPENDENT_AMBULATORY_CARE_PROVIDER_SITE_OTHER): Payer: Medicare Other | Admitting: Sports Medicine

## 2017-01-08 DIAGNOSIS — E1165 Type 2 diabetes mellitus with hyperglycemia: Secondary | ICD-10-CM

## 2017-01-08 DIAGNOSIS — L84 Corns and callosities: Secondary | ICD-10-CM | POA: Diagnosis not present

## 2017-01-08 DIAGNOSIS — B351 Tinea unguium: Secondary | ICD-10-CM

## 2017-01-08 DIAGNOSIS — M79609 Pain in unspecified limb: Secondary | ICD-10-CM

## 2017-01-08 DIAGNOSIS — IMO0001 Reserved for inherently not codable concepts without codable children: Secondary | ICD-10-CM

## 2017-01-08 DIAGNOSIS — Z794 Long term (current) use of insulin: Secondary | ICD-10-CM

## 2017-01-08 NOTE — Progress Notes (Signed)
Subjective: Alexis Farmer is a 76 y.o. Diabetic female patient presents to office with complaint of long nails and callus at left heel with pain. Reports that she prefers to see me because she did not have a good experience with the doctor she saw here last. Denies any other pedal complaints.   Patient Active Problem List   Diagnosis Date Noted  . Peeling skin 11/22/2016  . Rotator cuff impingement syndrome, left 11/08/2016  . UTI (urinary tract infection) 09/24/2016  . Rectal bleed 07/29/2016  . Carotid stenosis 07/19/2016  . Chronic venous insufficiency 07/19/2016  . Pain and swelling of lower leg 07/19/2016  . Bradycardia 07/17/2016  . Postmenopausal bleeding 07/17/2016  . Chronic diastolic CHF (congestive heart failure) ([place]) 05/31/2016  . Cough 01/25/2016  . Chronic fatigue 11/15/2015  . Depression 10/13/2015  . Osteoarthritis 10/13/2015  . Vertigo 10/13/2015  . Anxiety 09/13/2015  . BP (high blood pressure) 09/13/2015  . GI bleed 08/03/2015  . Mixed hyperlipidemia 08/26/2014  . Essential hypertension, benign 04/08/2014  . Diabetes mellitus type II, uncontrolled ([place]) 04/08/2014  . CKD (chronic kidney disease) stage 3, GFR 30-59 ml/min 04/08/2014  . Pedal edema 04/08/2014  . Morbid obesity ([place]) 04/08/2014  . Lower back pain 04/08/2014  . Diabetic polyneuropathy ([place]) 04/08/2014    Current Outpatient Prescriptions on File Prior to Visit  Medication Sig Dispense Refill  . ALPRAZolam (XANAX) 0.5 MG tablet TAKE 1 TABLET BY MOUTH AT BEDTIME AS NEEDED FOR SLEEP 30 tablet 0  . B-D INS SYRINGE 0.5CC/30GX1/2" 30G X 1/2" 0.5 ML MISC USE AS DIRECTED 500 each 1  . cloNIDine (CATAPRES) 0.2 MG tablet TAKE 1 TABLET (0.2 MG TOTAL) BY MOUTH 2 (TWO) TIMES DAILY. (Patient taking differently: Take 0.2 mg by mouth 3 (three) times daily. ) 180 tablet 3  . gabapentin (NEURONTIN) 100 MG capsule Start with 1 tab po qhs X 1 week, then increase to 1 tab po bid X 1 week 90 capsule 1  . insulin  NPH Human (NOVOLIN N) 100 UNIT/ML injection INJECT 33 UNITS INTO THE SKIN IN THE MORNING AND 20 UNITS IN THE EVENING DAILY BEFORE A MEAL 50 mL 1  . insulin regular (NOVOLIN R) 100 units/mL injection INJECT 15 UNITS TOTAL INTO THE SKIN 3 (THREE) TIMES DAILY BEFORE MEALS. 60 mL 1  . Multiple Vitamin (MULTIVITAMIN WITH MINERALS) TABS tablet Take 1 tablet by mouth daily.    . polyethylene glycol powder (GLYCOLAX/MIRALAX) powder TAKE 17 G BY MOUTH DAILY. 1581 g 0  . spironolactone (ALDACTONE) 25 MG tablet TAKE 1 TABLET (25 MG TOTAL) BY MOUTH DAILY. 90 tablet 3  . torsemide (DEMADEX) 20 MG tablet Take 1 tablet (20 mg total) by mouth 2 (two) times daily. 180 tablet 3  . valsartan (DIOVAN) 320 MG tablet TAKE 1 TABLET (320 MG TOTAL) BY MOUTH DAILY. 90 tablet 3   No current facility-administered medications on file prior to visit.     Allergies  Allergen Reactions  . Penicillins Rash    Has patient had a PCN reaction causing immediate rash, facial/tongue/throat swelling, SOB or lightheadedness with hypotension: Yes Has patient had a PCN reaction causing severe rash involving mucus membranes or skin necrosis: No Has patient had a PCN reaction that required hospitalization No Has patient had a PCN reaction occurring within the last 10 years: Yes If all of the above answers are "NO", then may proceed with Cephalosporin use.     Objective: Physical Exam General: The patient is alert and oriented x3  in no acute distress.  Dermatology: Skin is warm, dry and supple bilateral lower extremities. Nails 1-10 are mildly elongated and thick. + callus at left lateral heel without signs of infection. There is no erythema, edema, no eccymosis, no open lesions present. Integument is otherwise unremarkable.  Vascular: Dorsalis Pedis pulse and Posterior Tibial pulse are 1/4 bilateral. Capillary fill time is immediate to all digits. Trace edema bilateral.   Neurological: Grossly intact to light touch with an  achilles reflex of +2/5 and a negative Tinel's sign bilateral. Protective with SWMF intact. Vibratory diminished bilateral.   Musculoskeletal: Mild tenderness at callus on left lateral heel. No tenderness to palpation at the medial calcaneal tubercale and through the insertion of the plantar fascia on the left and right foot and at achilles insertion. No pain with compression of calcaneus bilateral. No pain with tuning fork to calcaneus bilateral. No pain with calf compression bilateral. There is decreased Ankle joint range of motion bilateral. All other joints range of motion within normal limits bilateral. Strength 5/5 in all groups bilateral. Pes planus foot type.   Assessment: Problem List Items Addressed This Visit    None    Visit Diagnoses    Pain due to onychomycosis of nail    -  Primary   Callus of heel       Uncontrolled type 2 diabetes mellitus without complication, with long-term current use of insulin ([place])          Plan: -Complete examination performed.  -Mechanically debrided callus using sterile chisel blade x 1 at left heel and nails using sterile nail nipper without incident -Dispensed heel cushions -Recommend okeeffe healthy feet for callus and aspercreme for pain -Recommend elevation when sitting -Recommend good supportive shoes -Recommend diabetic control -Return in 10 weeks for nail and callus care or sooner if problems or issues arise.  [name], DPM

## 2017-01-08 NOTE — Patient Instructions (Signed)
Okeeffe Healthy Feet Cream for callus heels Aspercreme for heel pain All are over the counter.

## 2017-01-21 ENCOUNTER — Ambulatory Visit (INDEPENDENT_AMBULATORY_CARE_PROVIDER_SITE_OTHER): Payer: Medicare Other | Admitting: Family Medicine

## 2017-01-21 ENCOUNTER — Encounter: Payer: Self-pay | Admitting: Family Medicine

## 2017-01-21 VITALS — BP 118/70 | HR 84 | Temp 99.2°F | Wt 275.6 lb

## 2017-01-21 DIAGNOSIS — I6523 Occlusion and stenosis of bilateral carotid arteries: Secondary | ICD-10-CM

## 2017-01-21 DIAGNOSIS — H6123 Impacted cerumen, bilateral: Secondary | ICD-10-CM | POA: Diagnosis not present

## 2017-01-21 DIAGNOSIS — N183 Chronic kidney disease, stage 3 unspecified: Secondary | ICD-10-CM

## 2017-01-21 DIAGNOSIS — E1165 Type 2 diabetes mellitus with hyperglycemia: Secondary | ICD-10-CM | POA: Diagnosis not present

## 2017-01-21 DIAGNOSIS — H1132 Conjunctival hemorrhage, left eye: Secondary | ICD-10-CM | POA: Diagnosis not present

## 2017-01-21 DIAGNOSIS — M8949 Other hypertrophic osteoarthropathy, multiple sites: Secondary | ICD-10-CM

## 2017-01-21 DIAGNOSIS — Z794 Long term (current) use of insulin: Secondary | ICD-10-CM | POA: Diagnosis not present

## 2017-01-21 DIAGNOSIS — H1130 Conjunctival hemorrhage, unspecified eye: Secondary | ICD-10-CM | POA: Insufficient documentation

## 2017-01-21 DIAGNOSIS — F329 Major depressive disorder, single episode, unspecified: Secondary | ICD-10-CM

## 2017-01-21 DIAGNOSIS — F320 Major depressive disorder, single episode, mild: Secondary | ICD-10-CM

## 2017-01-21 DIAGNOSIS — M150 Primary generalized (osteo)arthritis: Secondary | ICD-10-CM | POA: Diagnosis not present

## 2017-01-21 DIAGNOSIS — M159 Polyosteoarthritis, unspecified: Secondary | ICD-10-CM

## 2017-01-21 DIAGNOSIS — IMO0002 Reserved for concepts with insufficient information to code with codable children: Secondary | ICD-10-CM

## 2017-01-21 DIAGNOSIS — H6120 Impacted cerumen, unspecified ear: Secondary | ICD-10-CM | POA: Insufficient documentation

## 2017-01-21 DIAGNOSIS — F331 Major depressive disorder, recurrent, moderate: Secondary | ICD-10-CM

## 2017-01-21 DIAGNOSIS — E1122 Type 2 diabetes mellitus with diabetic chronic kidney disease: Secondary | ICD-10-CM

## 2017-01-21 NOTE — Assessment & Plan Note (Signed)
Continues to have issues with this. She's hesitant to start on any medications for this though she is willing to be referred to psychology. Referral placed.

## 2017-01-21 NOTE — Patient Instructions (Signed)
Nice to see you. Please work on dietary changes as you have been. Please try to stay as active as possible. Please contact the [facility]ics office that she used previously for your knees. If they're unable to see you or if you decide to go in a different direction please let us know.

## 2017-01-21 NOTE — Assessment & Plan Note (Signed)
Uncontrolled. Patient unwilling to add additional medication. She'll continue to work on dietary changes. We'll plan on checking an A1c in about a month.

## 2017-01-21 NOTE — Assessment & Plan Note (Signed)
Appears to have improved. She'll monitor for recurrence. She'll continue to see ophthalmology as they advised.

## 2017-01-21 NOTE — Progress Notes (Signed)
Tommi Rumps, MD Phone: [phone]  Alexis Farmer is a 76 y.o. female who presents today for follow-up.  Depression: Notes this is about the same. She just doesn't care about a whole lot. She is just tired of hurting. She just wants to be left alone. She feels sad. She notes she does not want to hurt herself. No SI or HI. She's been hesitant to use medications in the past.  Bilateral knee pain: Left greater than right. Hurts most the time. No injury. She saw flexogenic's and had injections in her knees which helped with the pain significantly. They did x-rays at that time.  She reports what sounds to be a subconjunctival hemorrhage. She saw ophthalmology yesterday and had her vision checked and they advised that it was a "busted vessel.". She notes it has improved today. No vision issues.  Diabetes: Notes her sugars been running a little high into the 290s at times. She is currently taking NPH and Regular Insulin and has been hesitant to add any additional medications despite an A1c of 10. No polyuria or polydipsia. She does note her urination has actually gotten less frequent since changing her diet. No hyperglycemia. She has cut out fried foods and decreased juice and fruits. Also cut down on carbs. Mostly eating vegetables, chicken, salad, and fish. She is also following for a callus on the heel of her left foot. Notes this is being well treated.  Patient also notes occasionally being able to hear her heartbeat in her ears. She notes no wooshing or tinnitus.  PMH: nonsmoker.   ROS see history of present illness  Objective  Physical Exam Vitals:   01/21/17 1323 01/21/17 1403  BP: (!) 160/82 118/70  Pulse: 84   Temp: 99.2 F (37.3 C)     BP Readings from Last 3 Encounters:  01/21/17 118/70  12/14/16 140/80  11/26/16 (!) 150/70   Wt Readings from Last 3 Encounters:  01/21/17 275 lb 9.6 oz (125 kg)  12/14/16 277 lb 6.4 oz (125.8 kg)  11/26/16 281 lb 4 oz (127.6 kg)     Physical Exam  Constitutional: No distress.  HENT:  Bilateral TMs obscured by cerumen, irrigated by CMA revealing normal TMs with minimal wax still present  Eyes: Pupils are equal, round, and reactive to light. Conjunctivae are normal.  Cardiovascular: Normal rate, regular rhythm and normal heart sounds.   Pulmonary/Chest: Effort normal and breath sounds normal.  Musculoskeletal:  Left knee with lateral joint line tenderness, no apparent swelling, no other tenderness, right knee with no tenderness or swelling, negative McMurray's bilaterally, no ligamentous laxity bilaterally, patient declined direct visualization scan and exam was performed with her pain is over her knees  Neurological: She is alert. Gait normal.  Skin: Skin is warm and dry. She is not diaphoretic.  Slight callus left plantar heel     Assessment/Plan: Please see individual problem list.  Diabetes mellitus type II, uncontrolled (HCC) Uncontrolled. Patient unwilling to add additional medication. She'll continue to work on dietary changes. We'll plan on checking an A1c in about a month.  Depression Continues to have issues with this. She's hesitant to start on any medications for this though she is willing to be referred to psychology. Referral placed.  Osteoarthritis Suspect the pain is related to arthritis. Advised follow-up with orthopedics.  Cerumen impaction Irrigated by CMA. Normal TMs noted. Discussed if she can still hear her heartbeat despite irrigation she should let us know and we could consider ENT evaluation.  Subconjunctival hemorrhage  Appears to have improved. She'll monitor for recurrence. She'll continue to see ophthalmology as they advised.   Orders Placed This Encounter  Procedures  . Ambulatory referral to Psychology    Referral Priority:   Routine    Referral Type:   Psychiatric    Referral Reason:   Specialty Services Required    Requested Specialty:   Psychology    Number of Visits  Requested:   [place], MD [place]

## 2017-01-21 NOTE — Assessment & Plan Note (Signed)
Irrigated by [employer] Normal TMs noted. Discussed if she can still hear her heartbeat despite irrigation she should let us know and we could consider ENT evaluation.

## 2017-01-21 NOTE — Assessment & Plan Note (Signed)
Suspect the pain is related to arthritis. Advised follow-up with orthopedics.

## 2017-01-22 ENCOUNTER — Encounter: Payer: Self-pay | Admitting: Family Medicine

## 2017-01-22 ENCOUNTER — Telehealth: Payer: Self-pay | Admitting: *Deleted

## 2017-01-22 NOTE — Telephone Encounter (Signed)
Noted  

## 2017-01-22 NOTE — Telephone Encounter (Signed)
fyi

## 2017-01-22 NOTE — Telephone Encounter (Signed)
FYI : Patient stated that she discussed with Dr.Sonnenberg about her knee pain, and having a referral for knee treatment. Pt states that slexogix will treat her knee pain, and a referral is not needed.

## 2017-01-25 ENCOUNTER — Ambulatory Visit: Payer: Medicare Other | Admitting: Sports Medicine

## 2017-01-26 DIAGNOSIS — M4722 Other spondylosis with radiculopathy, cervical region: Secondary | ICD-10-CM | POA: Insufficient documentation

## 2017-01-26 HISTORY — DX: Other spondylosis with radiculopathy, cervical region: M47.22

## 2017-01-26 NOTE — Assessment & Plan Note (Signed)
>  50% of this 30 minute visit spent in direct patient counseling and/or coordination of care.  Discussion was focused on education regarding the in discussing the pathoetiology and anticipated clinical course of the above condition.  Ultimately discuss the merits of gabapentinoids for cervical radiculitis as well as diabetic neuropathy.  Low-dose prescription provided today and if any lack of improvement can consider further diagnostic evaluation with MRI of the cervical spine.  Shoulder exam does seem to be nonfocal and I suspect some underlying degenerative change with this appears to be more neurogenic in nature.

## 2017-02-01 ENCOUNTER — Other Ambulatory Visit: Payer: Self-pay | Admitting: Family Medicine

## 2017-02-02 ENCOUNTER — Other Ambulatory Visit: Payer: Self-pay | Admitting: Internal Medicine

## 2017-02-02 DIAGNOSIS — IMO0002 Reserved for concepts with insufficient information to code with codable children: Secondary | ICD-10-CM

## 2017-02-02 DIAGNOSIS — E1122 Type 2 diabetes mellitus with diabetic chronic kidney disease: Secondary | ICD-10-CM

## 2017-02-02 DIAGNOSIS — N183 Chronic kidney disease, stage 3 unspecified: Secondary | ICD-10-CM

## 2017-02-02 DIAGNOSIS — E1165 Type 2 diabetes mellitus with hyperglycemia: Principal | ICD-10-CM

## 2017-02-02 DIAGNOSIS — Z794 Long term (current) use of insulin: Principal | ICD-10-CM

## 2017-02-11 ENCOUNTER — Ambulatory Visit: Payer: Medicare Other | Admitting: Family Medicine

## 2017-02-20 ENCOUNTER — Other Ambulatory Visit: Payer: Self-pay | Admitting: Family Medicine

## 2017-02-25 ENCOUNTER — Other Ambulatory Visit: Payer: Medicare Other

## 2017-03-08 ENCOUNTER — Other Ambulatory Visit: Payer: Self-pay | Admitting: Family Medicine

## 2017-03-11 NOTE — Telephone Encounter (Addendum)
Pt requested to have this refilled today This will need to be a 90 day supply

## 2017-03-22 ENCOUNTER — Encounter: Payer: Self-pay | Admitting: Family Medicine

## 2017-03-22 ENCOUNTER — Ambulatory Visit (INDEPENDENT_AMBULATORY_CARE_PROVIDER_SITE_OTHER): Payer: Medicare Other | Admitting: Family Medicine

## 2017-03-22 VITALS — BP 100/60 | HR 75 | Temp 98.3°F | Wt 271.4 lb

## 2017-03-22 DIAGNOSIS — F330 Major depressive disorder, recurrent, mild: Secondary | ICD-10-CM

## 2017-03-22 DIAGNOSIS — R300 Dysuria: Secondary | ICD-10-CM

## 2017-03-22 DIAGNOSIS — R05 Cough: Secondary | ICD-10-CM | POA: Diagnosis not present

## 2017-03-22 DIAGNOSIS — Z23 Encounter for immunization: Secondary | ICD-10-CM

## 2017-03-22 DIAGNOSIS — E1165 Type 2 diabetes mellitus with hyperglycemia: Secondary | ICD-10-CM

## 2017-03-22 DIAGNOSIS — R059 Cough, unspecified: Secondary | ICD-10-CM

## 2017-03-22 DIAGNOSIS — E782 Mixed hyperlipidemia: Secondary | ICD-10-CM | POA: Diagnosis not present

## 2017-03-22 DIAGNOSIS — I6523 Occlusion and stenosis of bilateral carotid arteries: Secondary | ICD-10-CM

## 2017-03-22 HISTORY — DX: Dysuria: R30.0

## 2017-03-22 LAB — POCT URINALYSIS DIPSTICK
BILIRUBIN UA: NEGATIVE
Blood, UA: NEGATIVE
Glucose, UA: NEGATIVE
KETONES UA: NEGATIVE
NITRITE UA: NEGATIVE
PH UA: 6 (ref 5.0–8.0)
PROTEIN UA: NEGATIVE
UROBILINOGEN UA: 1 U/dL

## 2017-03-22 LAB — COMPREHENSIVE METABOLIC PANEL
ALT: 24 U/L (ref 0–35)
AST: 22 U/L (ref 0–37)
Albumin: 4.2 g/dL (ref 3.5–5.2)
Alkaline Phosphatase: 55 U/L (ref 39–117)
BUN: 28 mg/dL — ABNORMAL HIGH (ref 6–23)
CALCIUM: 9.8 mg/dL (ref 8.4–10.5)
CO2: 29 meq/L (ref 19–32)
Chloride: 98 mEq/L (ref 96–112)
Creatinine, Ser: 1.48 mg/dL — ABNORMAL HIGH (ref 0.40–1.20)
GFR: 43.84 mL/min — AB (ref >60.00)
Glucose, Bld: 153 mg/dL — ABNORMAL HIGH (ref 70–99)
POTASSIUM: 4.4 meq/L (ref 3.5–5.1)
Sodium: 136 mEq/L (ref 135–145)
Total Bilirubin: 0.7 mg/dL (ref 0.2–1.2)
Total Protein: 7.7 g/dL (ref 6.0–8.3)

## 2017-03-22 LAB — HEMOGLOBIN A1C: Hgb A1c MFr Bld: 9.5 % — ABNORMAL HIGH (ref 4.6–6.5)

## 2017-03-22 LAB — URINALYSIS, MICROSCOPIC ONLY: RBC / HPF: NONE SEEN (ref 0–?)

## 2017-03-22 LAB — LDL CHOLESTEROL, DIRECT: LDL DIRECT: 72 mg/dL

## 2017-03-22 NOTE — Progress Notes (Addendum)
  Tommi Rumps, MD Phone: [phone]  Alexis Farmer is a 76 y.o. female who presents today for follow-up.  Diabetes: Blood glucose runs anywhere from 90-300. She currently takes NPH 33 units in the morning and 15 units at night. She takes regular insulin typically 15 units twice daily though occasionally takes a dose at night. She skips the nighttime dose if her blood sugar is less than 190. She does get hypoglycemic symptoms at night though her blood sugar is never less than 80. She's changed her diet quite a bit. She has cut out fried foods and has decreased her quantities.  Patient notes chronic intermittent cough and some postnasal drainage at night. No symptoms during the day. No shortness of breath. Coughs up a little bit of mucus with this. Notes when she washes her pillows she does not have any symptoms.  Patient notes intermittent dysuria for some time now. Also some frequency and urgency. Some suprapubic discomfort at times. No vaginal discharge. No fevers.  Depression: Patient notes this is better. She did not see the psychologist. She notes no SI or HI.  PMH: nonsmoker.   ROS see history of present illness  Objective  Physical Exam Vitals:   03/22/17 1127 03/22/17 1152  BP: (!) 152/76 100/60  Pulse: 75   Temp: 98.3 F (36.8 C)   SpO2: 98%     BP Readings from Last 3 Encounters:  03/22/17 100/60  01/21/17 118/70  12/14/16 140/80   Wt Readings from Last 3 Encounters:  03/22/17 271 lb 6.4 oz (123.1 kg)  01/21/17 275 lb 9.6 oz (125 kg)  12/14/16 277 lb 6.4 oz (125.8 kg)    Physical Exam  Constitutional: No distress.  Cardiovascular: Normal rate, regular rhythm and normal heart sounds.   Pulmonary/Chest: Effort normal and breath sounds normal.  Abdominal: Soft. Bowel sounds are normal. She exhibits no distension. There is no tenderness. There is no rebound and no guarding.  Musculoskeletal: She exhibits no edema.  Neurological: She is alert. Gait normal.    Skin: Skin is warm and dry. She is not diaphoretic.     Assessment/Plan: Please see individual problem list.  Diabetes mellitus type II, uncontrolled ([place]) Still uncontrolled. Patient's been unwilling to alter her regimen. We'll check an A1c today.  Mixed hyperlipidemia Check LDL.  Depression Quite a bit improved. Still some mild symptoms. No SI or HI. She'll continue to monitor.  Cough Minimal nighttime cough. Improves when she washes her pillows consistently. Suspect allergies contributing. She'll monitor.  Dysuria Intermittent dysuria. We'll check a UA today and determine if she needs antibiotics.   Orders Placed This Encounter  Procedures  . Comp Met (CMET)  . HgB A1c  . Direct LDL  . POCT Urinalysis Dipstick    Tommi Rumps, MD [place]

## 2017-03-22 NOTE — Patient Instructions (Signed)
Nice to see you. We'll check lab work today and contact you with the results. Please monitor your cough and try to keep your pillows clean. Your cough is likely allergy related.

## 2017-03-22 NOTE — Assessment & Plan Note (Signed)
Quite a bit improved. Still some mild symptoms. No SI or HI. She'll continue to monitor.

## 2017-03-22 NOTE — Assessment & Plan Note (Signed)
Minimal nighttime cough. Improves when she washes her pillows consistently. Suspect allergies contributing. She'll monitor.

## 2017-03-22 NOTE — Assessment & Plan Note (Signed)
Still uncontrolled. Patient's been unwilling to alter her regimen. We'll check an A1c today.

## 2017-03-22 NOTE — Assessment & Plan Note (Signed)
Check LDL. 

## 2017-03-22 NOTE — Addendum Note (Signed)
Addended by: Arby Barrette on: 03/22/2017 01:03 PM   Modules accepted: Orders

## 2017-03-22 NOTE — Assessment & Plan Note (Signed)
Intermittent dysuria. We'll check a UA today and determine if she needs antibiotics.

## 2017-03-23 LAB — URINE CULTURE
MICRO NUMBER:: 81110562
SPECIMEN QUALITY: ADEQUATE

## 2017-03-25 ENCOUNTER — Telehealth: Payer: Self-pay | Admitting: Radiology

## 2017-03-25 DIAGNOSIS — N179 Acute kidney failure, unspecified: Secondary | ICD-10-CM

## 2017-03-25 NOTE — Addendum Note (Signed)
Addended by: Caryl Bis, Phoebie Shad G on: 03/25/2017 01:11 PM   Modules accepted: Orders

## 2017-03-25 NOTE — Telephone Encounter (Signed)
Pt coming in for labs tomorrow, please place future orders. Thank you.  

## 2017-03-25 NOTE — Telephone Encounter (Signed)
Order placed

## 2017-03-26 ENCOUNTER — Other Ambulatory Visit (INDEPENDENT_AMBULATORY_CARE_PROVIDER_SITE_OTHER): Payer: Medicare Other

## 2017-03-26 DIAGNOSIS — N179 Acute kidney failure, unspecified: Secondary | ICD-10-CM | POA: Diagnosis not present

## 2017-03-26 LAB — BASIC METABOLIC PANEL
BUN: 17 mg/dL (ref 6–23)
CHLORIDE: 98 meq/L (ref 96–112)
CO2: 30 mEq/L (ref 19–32)
Calcium: 10.2 mg/dL (ref 8.4–10.5)
Creatinine, Ser: 1.18 mg/dL (ref 0.40–1.20)
GFR: 56.94 mL/min — ABNORMAL LOW (ref >60.00)
Glucose, Bld: 270 mg/dL — ABNORMAL HIGH (ref 70–99)
POTASSIUM: 4.7 meq/L (ref 3.5–5.1)
SODIUM: 136 meq/L (ref 135–145)

## 2017-03-27 ENCOUNTER — Telehealth: Payer: Self-pay | Admitting: *Deleted

## 2017-03-27 NOTE — Telephone Encounter (Signed)
Pt requested lab result, when resulted   Pt contact [phone]

## 2017-03-27 NOTE — Telephone Encounter (Signed)
See result note.  

## 2017-03-28 ENCOUNTER — Telehealth: Payer: Self-pay | Admitting: Family Medicine

## 2017-03-28 DIAGNOSIS — I10 Essential (primary) hypertension: Secondary | ICD-10-CM

## 2017-03-28 MED ORDER — OLMESARTAN MEDOXOMIL 40 MG PO TABS: 40.0000 mg | ORAL_TABLET | Freq: Every day | ORAL | 3 refills | Status: DC

## 2017-03-28 NOTE — Telephone Encounter (Signed)
Benicar sent to pharmacy. Patient will need to be met in 1 week to ensure that her renal function and potassium tolerate this new medication. Order has been placed. Please get her set up for a lab appointment.

## 2017-03-28 NOTE — Addendum Note (Signed)
Addended by: Caryl Bis, Marda Breidenbach G on: 03/28/2017 12:01 PM   Modules accepted: Orders

## 2017-03-28 NOTE — Telephone Encounter (Signed)
She should contact her pharmacy today and see if her valsartan was part of the recall. There are other manufacturers of the valsartan that were not part of the recall. If her medication was part of the recall I am happy to send in a new prescription for her. Or if she would like to just change the medication I am happy to send in benicar for her. Thanks.

## 2017-03-28 NOTE — Telephone Encounter (Signed)
Patient scheduled.

## 2017-03-28 NOTE — Telephone Encounter (Signed)
Please advise 

## 2017-03-28 NOTE — Telephone Encounter (Signed)
Patient states she asked but they informed her it was not part of the recall. Patient states she does not want to continue to take this and would like rx for benicar to be sent to [facility]

## 2017-03-28 NOTE — Telephone Encounter (Signed)
Pt called and stated that she saw on tv yesterday that there is a recall on valsartan (DIOVAN) 320 MG tablet. As of today she is not taking it, can we call something else in for her? Please advise, thank you!  Call pt @ [phone]

## 2017-04-04 ENCOUNTER — Other Ambulatory Visit (INDEPENDENT_AMBULATORY_CARE_PROVIDER_SITE_OTHER): Payer: Medicare Other

## 2017-04-04 DIAGNOSIS — I10 Essential (primary) hypertension: Secondary | ICD-10-CM

## 2017-04-04 LAB — BASIC METABOLIC PANEL
BUN: 29 mg/dL — ABNORMAL HIGH (ref 6–23)
CO2: 31 mEq/L (ref 19–32)
Calcium: 10.2 mg/dL (ref 8.4–10.5)
Chloride: 96 mEq/L (ref 96–112)
Creatinine, Ser: 1.63 mg/dL — ABNORMAL HIGH (ref 0.40–1.20)
GFR: 39.22 mL/min — AB (ref >60.00)
Glucose, Bld: 230 mg/dL — ABNORMAL HIGH (ref 70–99)
POTASSIUM: 4.3 meq/L (ref 3.5–5.1)
SODIUM: 135 meq/L (ref 135–145)

## 2017-04-05 ENCOUNTER — Telehealth: Payer: Self-pay

## 2017-04-05 DIAGNOSIS — N179 Acute kidney failure, unspecified: Secondary | ICD-10-CM

## 2017-04-05 NOTE — Telephone Encounter (Signed)
Patient called very anxious and upset about lab results. She was wondering if she should move her appointment up with kidney doctor. Reassured patient that the slight decrease in her kidney function could be caused by dehydration and you wanted her to increase fluids and recheck labs before determining for sure what should be done.

## 2017-04-05 NOTE — Addendum Note (Signed)
Addended by: Leone Haven on: 04/05/2017 04:54 PM   Modules accepted: Orders

## 2017-04-05 NOTE — Telephone Encounter (Signed)
Agree. We will recheck and determine what to do next.

## 2017-04-08 ENCOUNTER — Other Ambulatory Visit: Payer: Self-pay | Admitting: Family Medicine

## 2017-04-09 ENCOUNTER — Encounter: Payer: Self-pay | Admitting: Sports Medicine

## 2017-04-09 ENCOUNTER — Ambulatory Visit (INDEPENDENT_AMBULATORY_CARE_PROVIDER_SITE_OTHER): Payer: Medicare Other | Admitting: Sports Medicine

## 2017-04-09 DIAGNOSIS — B351 Tinea unguium: Secondary | ICD-10-CM

## 2017-04-09 DIAGNOSIS — M79672 Pain in left foot: Secondary | ICD-10-CM

## 2017-04-09 DIAGNOSIS — E1165 Type 2 diabetes mellitus with hyperglycemia: Secondary | ICD-10-CM

## 2017-04-09 DIAGNOSIS — Z794 Long term (current) use of insulin: Secondary | ICD-10-CM

## 2017-04-09 DIAGNOSIS — M79671 Pain in right foot: Secondary | ICD-10-CM

## 2017-04-09 DIAGNOSIS — L84 Corns and callosities: Secondary | ICD-10-CM

## 2017-04-09 DIAGNOSIS — M79609 Pain in unspecified limb: Secondary | ICD-10-CM

## 2017-04-09 DIAGNOSIS — IMO0001 Reserved for inherently not codable concepts without codable children: Secondary | ICD-10-CM

## 2017-04-09 NOTE — Progress Notes (Signed)
Subjective: Alexis Farmer is a 76 y.o. Diabetic female patient presents to office with complaint of long nails and callus at left heel that is getting better, heel cushion helps. Reports that her last sugar was 190 and A1c was 9. Denies any other pedal complaints.   Patient Active Problem List   Diagnosis Date Noted  . Dysuria 03/22/2017  . Cervical spondylosis with radiculopathy 01/26/2017  . Cerumen impaction 01/21/2017  . Rotator cuff impingement syndrome, left 11/08/2016  . Rectal bleed 07/29/2016  . Carotid stenosis 07/19/2016  . Chronic venous insufficiency 07/19/2016  . Pain and swelling of lower leg 07/19/2016  . Bradycardia 07/17/2016  . Postmenopausal bleeding 07/17/2016  . Chronic diastolic CHF (congestive heart failure) ([place]) 05/31/2016  . Cough 01/25/2016  . Chronic fatigue 11/15/2015  . Depression 10/13/2015  . Osteoarthritis 10/13/2015  . Vertigo 10/13/2015  . Anxiety 09/13/2015  . GI bleed 08/03/2015  . Mixed hyperlipidemia 08/26/2014  . Essential hypertension, benign 04/08/2014  . Diabetes mellitus type II, uncontrolled ([place]) 04/08/2014  . CKD (chronic kidney disease) stage 3, GFR 30-59 ml/min (HCC) 04/08/2014  . Pedal edema 04/08/2014  . Morbid obesity ([place]) 04/08/2014  . Lower back pain 04/08/2014  . Diabetic polyneuropathy ([place]) 04/08/2014    Current Outpatient Prescriptions on File Prior to Visit  Medication Sig Dispense Refill  . ALPRAZolam (XANAX) 0.5 MG tablet TAKE 1 TABLET BY MOUTH AT BEDTIME AS NEEDED FOR SLEEP 30 tablet 0  . B-D INS SYRINGE 0.5CC/30GX1/2" 30G X 1/2" 0.5 ML MISC USE AS DIRECTED 500 each 1  . cloNIDine (CATAPRES) 0.2 MG tablet TAKE 1 TABLET (0.2 MG TOTAL) BY MOUTH 2 (TWO) TIMES DAILY. (Patient taking differently: Take 0.2 mg by mouth 3 (three) times daily. ) 180 tablet 3  . insulin regular (NOVOLIN R) 100 units/mL injection INJECT 15 UNITS TOTAL INTO THE SKIN 3 (THREE) TIMES DAILY BEFORE MEALS. 60 mL 1  . Multiple Vitamin  (MULTIVITAMIN WITH MINERALS) TABS tablet Take 1 tablet by mouth daily.    Marland Kitchen NOVOLIN N 100 UNIT/ML injection INJECT 33 UNITS INTO THE SKIN IN THE MORNING AND 20 UNITS IN THE EVENING DAILY BEFORE A MEAL 50 mL 1  . NOVOLIN R 100 UNIT/ML injection INJECT 15 UNITS TOTAL INTO THE SKIN 3 (THREE) TIMES DAILY BEFORE MEALS. 20 mL 1  . olmesartan (BENICAR) 40 MG tablet Take 1 tablet (40 mg total) by mouth daily. 90 tablet 3  . polyethylene glycol powder (GLYCOLAX/MIRALAX) powder TAKE 17 G BY MOUTH DAILY. 1581 g 0  . rosuvastatin (CRESTOR) 10 MG tablet TAKE 1 TABLET (10 MG TOTAL) BY MOUTH DAILY. 90 tablet 3  . spironolactone (ALDACTONE) 25 MG tablet TAKE 1 TABLET (25 MG TOTAL) BY MOUTH DAILY. 90 tablet 3  . torsemide (DEMADEX) 20 MG tablet Take 1 tablet (20 mg total) by mouth 2 (two) times daily. 180 tablet 3   No current facility-administered medications on file prior to visit.     Allergies  Allergen Reactions  . Penicillins Rash    Has patient had a PCN reaction causing immediate rash, facial/tongue/throat swelling, SOB or lightheadedness with hypotension: Yes Has patient had a PCN reaction causing severe rash involving mucus membranes or skin necrosis: No Has patient had a PCN reaction that required hospitalization No Has patient had a PCN reaction occurring within the last 10 years: Yes If all of the above answers are "NO", then may proceed with Cephalosporin use.     Objective: Physical Exam General: The patient is alert and  oriented x3 in no acute distress.  Dermatology: Skin is warm, dry and supple bilateral lower extremities. Nails 1-10 are mildly elongated and thick. + minimal callus at left lateral heel without signs of infection. There is no erythema, edema, no eccymosis, no open lesions present. Integument is otherwise unremarkable.  Vascular: Dorsalis Pedis pulse and Posterior Tibial pulse are 1/4 bilateral. Capillary fill time is immediate to all digits. Trace edema bilateral.    Neurological: Grossly intact to light touch with an achilles reflex of +2/5 and a negative Tinel's sign bilateral. Protective with SWMF intact. Vibratory diminished bilateral.   Musculoskeletal: Minimal tenderness at callus on left lateral heel. No tenderness to palpation at the medial calcaneal tubercale and through the insertion of the plantar fascia on the left and right foot and at achilles insertion. No pain with compression of calcaneus bilateral. No pain with tuning fork to calcaneus bilateral. No pain with calf compression bilateral. There is decreased Ankle joint range of motion bilateral. All other joints range of motion within normal limits bilateral. Strength 5/5 in all groups bilateral. Pes planus foot type.   Assessment: Problem List Items Addressed This Visit    None    Visit Diagnoses    Pain due to onychomycosis of nail    -  Primary   Callus of heel       Uncontrolled type 2 diabetes mellitus without complication, with long-term current use of insulin (HCC)       Bilateral foot pain          Plan: -Complete examination performed.  -Mechanically debrided callus using sterile chisel blade x 1 at left heel and nails using sterile nail nipper without incident -Dispensed heel cushions -Recommend okeeffe healthy feet for callus and aspercreme for pain -Recommend elevation when sitting -Recommend good supportive shoes -Recommend diabetic control -Return in 10 weeks for nail and callus care or sooner if problems or issues arise.  [name], DPM

## 2017-04-11 ENCOUNTER — Other Ambulatory Visit (INDEPENDENT_AMBULATORY_CARE_PROVIDER_SITE_OTHER): Payer: Medicare Other

## 2017-04-11 DIAGNOSIS — N179 Acute kidney failure, unspecified: Secondary | ICD-10-CM | POA: Diagnosis not present

## 2017-04-11 LAB — BASIC METABOLIC PANEL
BUN: 18 mg/dL (ref 6–23)
CHLORIDE: 99 meq/L (ref 96–112)
CO2: 31 mEq/L (ref 19–32)
Calcium: 10.1 mg/dL (ref 8.4–10.5)
Creatinine, Ser: 1.24 mg/dL — ABNORMAL HIGH (ref 0.40–1.20)
GFR: 53.77 mL/min — AB (ref >60.00)
Glucose, Bld: 223 mg/dL — ABNORMAL HIGH (ref 70–99)
POTASSIUM: 4 meq/L (ref 3.5–5.1)
Sodium: 137 mEq/L (ref 135–145)

## 2017-04-17 ENCOUNTER — Ambulatory Visit (INDEPENDENT_AMBULATORY_CARE_PROVIDER_SITE_OTHER): Payer: Medicare Other

## 2017-04-17 VITALS — BP 130/70 | HR 82 | Temp 98.3°F | Resp 16 | Ht 63.0 in | Wt 267.8 lb

## 2017-04-17 DIAGNOSIS — Z0000 Encounter for general adult medical examination without abnormal findings: Secondary | ICD-10-CM | POA: Diagnosis not present

## 2017-04-17 NOTE — Patient Instructions (Addendum)
  Ms. Lehew , Thank you for taking time to come for your Medicare Wellness Visit. I appreciate your ongoing commitment to your health goals. Please review the following plan we discussed and let me know if I can assist you in the future.   Follow up with Dr. Derrel Nip as needed.    Bring a copy of your [place] and/or Living Will to be scanned into chart once completed.  Have a great day!  These are the goals we discussed: Goals    . Eat breakfast/lunch          Supplement meal with Ensure protein drink instead of skipping breakfast and lunch.  Check BS 3 times daily.       This is a list of the screening recommended for you and due dates:  Health Maintenance  Topic Date Due  . Tetanus Vaccine  01/13/1960  . DEXA scan (bone density measurement)  01/14/2004  . Pneumonia vaccines (1 of 2 - PCV13) 01/14/2004  . Eye exam for diabetics  11/15/2016  . Complete foot exam   12/15/2016  . Hemoglobin A1C  09/20/2017  . Flu Shot  Completed

## 2017-04-17 NOTE — Progress Notes (Signed)
Subjective:   Alexis Farmer is a 76 y.o. female who presents for an Initial Medicare Annual Wellness Visit.  Review of Systems    No ROS.  Medicare Wellness Visit. Additional risk factors are reflected in the social history.  Cardiac Risk Factors include: advanced age (>74men, >44 women);diabetes mellitus;obesity (BMI >30kg/m2);hypertension     Objective:    Today's Vitals   04/17/17 0905  BP: 130/70  Pulse: 82  Resp: 16  Temp: 98.3 F (36.8 C)  TempSrc: Oral  SpO2: 99%  Weight: 267 lb 12.8 oz (121.5 kg)  Height: 5\' 3"  (1.6 m)   Body mass index is 47.44 kg/m.   Current Medications (verified) Outpatient Encounter Prescriptions as of 04/17/2017  Medication Sig  . ALPRAZolam (XANAX) 0.5 MG tablet TAKE 1 TABLET BY MOUTH AT BEDTIME AS NEEDED FOR SLEEP  . B-D INS SYRINGE 0.5CC/30GX1/2" 30G X 1/2" 0.5 ML MISC USE AS DIRECTED  . cloNIDine (CATAPRES) 0.2 MG tablet TAKE 1 TABLET (0.2 MG TOTAL) BY MOUTH 2 (TWO) TIMES DAILY. (Patient taking differently: Take 0.2 mg by mouth 3 (three) times daily. )  . insulin regular (NOVOLIN R) 100 units/mL injection INJECT 15 UNITS TOTAL INTO THE SKIN 3 (THREE) TIMES DAILY BEFORE MEALS.  . Multiple Vitamin (MULTIVITAMIN WITH MINERALS) TABS tablet Take 1 tablet by mouth daily.  Marland Kitchen NOVOLIN N 100 UNIT/ML injection INJECT 33 UNITS INTO THE SKIN IN THE MORNING AND 20 UNITS IN THE EVENING DAILY BEFORE A MEAL  . NOVOLIN R 100 UNIT/ML injection INJECT 15 UNITS TOTAL INTO THE SKIN 3 (THREE) TIMES DAILY BEFORE MEALS.  Marland Kitchen olmesartan (BENICAR) 40 MG tablet Take 1 tablet (40 mg total) by mouth daily.  . polyethylene glycol powder (GLYCOLAX/MIRALAX) powder TAKE 17 G BY MOUTH DAILY.  . rosuvastatin (CRESTOR) 10 MG tablet TAKE 1 TABLET (10 MG TOTAL) BY MOUTH DAILY.  Marland Kitchen spironolactone (ALDACTONE) 25 MG tablet TAKE 1 TABLET (25 MG TOTAL) BY MOUTH DAILY.  Marland Kitchen torsemide (DEMADEX) 20 MG tablet Take 1 tablet (20 mg total) by mouth 2 (two) times daily.   No  facility-administered encounter medications on file as of 04/17/2017.     Allergies (verified) Penicillins   History: Past Medical History:  Diagnosis Date  . Arthritis   . Chickenpox   . Diabetes mellitus without complication ([place])    one elevated reading/ no treatment  . Diverticulitis   . GI bleed   . High cholesterol   . History of blood transfusion   . Hypertension   . Renal insufficiency    Past Surgical History:  Procedure Laterality Date  . APPENDECTOMY    . ECTOPIC PREGNANCY SURGERY    . EYE SURGERY     bilateral cataracts  . gallbladder     . HYSTEROSCOPY W/D&C N/A 10/26/2016   Procedure: DILATATION AND CURETTAGE /HYSTEROSCOPY;  Surgeon: Benjaman Kindler, MD;  Location: [facility] ORS;  Service: Gynecology;  Laterality: N/A;  . THYROIDECTOMY, PARTIAL     Family History  Problem Relation Age of Onset  . Diabetes Mother   . Hypertension Mother   . Stroke Mother   . Diabetes Other   . Diabetes Sister   . Heart disease Sister    Social History   Occupational History  . Not on file.   Social History Main Topics  . Smoking status: Never Smoker  . Smokeless tobacco: Never Used  . Alcohol use No  . Drug use: No  . Sexual activity: Not on file    Tobacco Counseling  Counseling given: Not Answered   Activities of Daily Living In your present state of health, do you have any difficulty performing the following activities: 04/17/2017 10/23/2016  Hearing? Y N  Vision? N N  Difficulty concentrating or making decisions? N N  Walking or climbing stairs? Y Y  Dressing or bathing? N N  Doing errands, shopping? N N  Preparing Food and eating ? N -  Using the Toilet? N -  In the past six months, have you accidently leaked urine? Y -  Comment Managed with a poise pad as needed. -  Do you have problems with loss of bowel control? N -  Managing your Medications? N -  Managing your Finances? N -  Housekeeping or managing your Housekeeping? N -  Some recent data might  be hidden    Immunizations and Health Maintenance Immunization History  Administered Date(s) Administered  . Influenza, High Dose Seasonal PF 03/22/2017  . Influenza-Unspecified 03/19/2015, 06/24/2016   Health Maintenance Due  Topic Date Due  . TETANUS/TDAP  01[birth year]13/1960  . DEXA SCAN  01/14/2004  . PNA vac Low Risk Adult (1 of 2 - PCV13) 01/14/2004  . OPHTHALMOLOGY EXAM  11/15/2016  . FOOT EXAM  12/15/2016    Patient Care Team: Leone Haven, MD as PCP - General (Family Medicine) Rockey Situ Kathlene November, MD as Consulting Physician (Cardiology)  Indicate any recent Medical Services you may have received from other than [facility] providers in the past year (date may be approximate).     Assessment:   This is a routine wellness examination for Alexis Farmer. The goal of the wellness visit is to assist the patient how to close the gaps in care and create a preventative care plan for the patient.   The roster of all physicians providing medical care to patient is listed in the Snapshot section of the chart.  Osteoporosis risk reviewed.    Safety issues reviewed; Smoke and carbon monoxide detectors in the home. No firearms in the home.  Wears seatbelts when driving or riding with others. Patient does wear sunscreen or protective clothing when in direct sunlight. No violence in the home.  Patient is alert, normal appearance, oriented to person/place/and time.  Correctly identified the president of the [place], recall of 2/3 words, and performing simple calculations. Displays appropriate judgement and can read correct time from watch face.   No new identified risk were noted.  No failures at ADL's or IADL's.  Ambulates with cane.  BMI- discussed the importance of a healthy diet, water intake and the benefits of aerobic exercise. Educational material provided.   24 hour diet recall: Breakfast: none Lunch: none Dinner: chicken, green vegetable  Encouraged to supplement meal with Ensure protein  drink instead of skipping breakfast and lunch.  Daily fluid intake: 0 cups of caffeine, 8 cups of water, 1 diet soda  Dental- every 6 months.  Sleep patterns- Sleeps 4 hours at night.  Wakes feeling fatigued/tired.  She does not use sleep medication regularly.  Prevnar 13 and TDAP vaccine deferred per patient preference.   Educational material provided.  Dexa Scan discussed. Educational material provided.  Patient Concerns: Intermittent uncomfortable sensation, mid left posterior, tender to touch, 2"x2". Onset several months ago.  Uses OTC topical aspercreme for relief. Follow up appointment offered with PCP; declined. Encouraged to follow up with PCP if symptoms worsen and as needed.  Hearing/Vision screen Hearing Screening Comments: Followed by [facility] Hearing is worse in the R ear Difficulty hearing conversational tones  Additional audiology testing deferred per patient preference She does not wear hearing aids Vision Screening Comments: Followed by [facility] Wears corrective lenses OV every 6 months No retinopathy reported Cataract extraction, bilateral Visual acuity not assessed per patient preference since they have regular follow up with the ophthalmologist  Dietary issues and exercise activities discussed: Current Exercise Habits: Home exercise routine, Type of exercise: calisthenics;stretching (Chair exercises), Time (Minutes): 10, Frequency (Times/Week): 7, Weekly Exercise (Minutes/Week): 70, Intensity: Mild  Goals    . Eat breakfast/lunch          Supplement meal with Ensure protein drink instead of skipping breakfast and lunch.  Check BS 3 times daily.      Depression Screen PHQ 2/9 Scores 04/17/2017 01/25/2016 11/15/2015 10/13/2015  PHQ - 2 Score 1 3 5 4   PHQ- 9 Score 3 11 9 8     Fall Risk Fall Risk  04/17/2017 03/22/2017 07/17/2016 04/30/2016  Falls in the past year? No No No No    Cognitive Function:     6CIT Screen 04/17/2017  What Year? 0  points  What month? 0 points  What time? 0 points  Count back from 20 0 points  Months in reverse 0 points  Repeat phrase 0 points  Total Score 0    Screening Tests Health Maintenance  Topic Date Due  . TETANUS/TDAP  01[birth year]13/1960  . DEXA SCAN  01/14/2004  . PNA vac Low Risk Adult (1 of 2 - PCV13) 01/14/2004  . OPHTHALMOLOGY EXAM  11/15/2016  . FOOT EXAM  12/15/2016  . HEMOGLOBIN A1C  09/20/2017  . INFLUENZA VACCINE  Completed      Plan:   End of life planning; Advanced aging; Advanced directives discussed.  No HCPOA/Living Will.  Additional information provided to help them start the conversation with family.  Copy of HCPOA/Living Will requested upon completion. Time spent on this topic is 25 minutes.  I have personally reviewed and noted the following in the patient's chart:   . Medical and social history . Use of alcohol, tobacco or illicit drugs  . Current medications and supplements . Functional ability and status . Nutritional status . Physical activity . Advanced directives . List of other physicians . Hospitalizations, surgeries, and ER visits in previous 12 months . Vitals . Screenings to include cognitive, depression, and falls . Referrals and appointments  In addition, I have reviewed and discussed with patient certain preventive protocols, quality metrics, and best practice recommendations. A written personalized care plan for preventive services as well as general preventive health recommendations were provided to patient.     Varney Biles, LPN   58/59/2924

## 2017-04-24 ENCOUNTER — Telehealth: Payer: Self-pay

## 2017-04-24 NOTE — Telephone Encounter (Signed)
Returned patient's call regarding HCPOA.  Resolved with no further questions or concerns.

## 2017-04-24 NOTE — Telephone Encounter (Signed)
Denisa, please call patient  Copied from [place] [id number]. Topic: Inquiry >> Apr 22, 2017  3:48 PM Oliver Pila B wrote: Reason for CRM: PT called back b/c she missed a phone call from the office, there was no documentation on what to inform the PT on, contact PT if needed  >> Apr 24, 2017  9:34 AM Malena Catholic I, NT wrote: Pt. Calling back to speak to [facility]. Health Coach please call pt back ASAP.

## 2017-05-08 ENCOUNTER — Telehealth: Payer: Self-pay | Admitting: *Deleted

## 2017-05-08 NOTE — Telephone Encounter (Signed)
Pt states Dr.Stover said to call her if she had any problems. I spoke with pt and she stated the thing on her heel was gone but it was so painful she couldn't walk. I reviewed LOV and pt had a callous on her heel and Dr. Cannon Kettle said she had wanted her back in 10 weeks. I told pt to rest as much as possible and that Dr. Cannon Kettle needed to see her and transferred her to schedulers.

## 2017-05-10 ENCOUNTER — Other Ambulatory Visit: Payer: Self-pay | Admitting: Family Medicine

## 2017-05-28 ENCOUNTER — Other Ambulatory Visit: Payer: Self-pay | Admitting: Family Medicine

## 2017-05-31 NOTE — Telephone Encounter (Signed)
Please fax

## 2017-05-31 NOTE — Telephone Encounter (Signed)
Last OV 03/22/17 last filled 11/02/16 30 0rf

## 2017-05-31 NOTE — Telephone Encounter (Signed)
faxed

## 2017-06-04 ENCOUNTER — Ambulatory Visit (INDEPENDENT_AMBULATORY_CARE_PROVIDER_SITE_OTHER): Payer: Medicare Other | Admitting: Sports Medicine

## 2017-06-04 ENCOUNTER — Encounter: Payer: Self-pay | Admitting: Sports Medicine

## 2017-06-04 DIAGNOSIS — B351 Tinea unguium: Secondary | ICD-10-CM

## 2017-06-04 DIAGNOSIS — L84 Corns and callosities: Secondary | ICD-10-CM

## 2017-06-04 DIAGNOSIS — M79671 Pain in right foot: Secondary | ICD-10-CM

## 2017-06-04 DIAGNOSIS — M79609 Pain in unspecified limb: Principal | ICD-10-CM

## 2017-06-04 DIAGNOSIS — Z794 Long term (current) use of insulin: Secondary | ICD-10-CM

## 2017-06-04 DIAGNOSIS — IMO0001 Reserved for inherently not codable concepts without codable children: Secondary | ICD-10-CM

## 2017-06-04 DIAGNOSIS — M79676 Pain in unspecified toe(s): Secondary | ICD-10-CM

## 2017-06-04 DIAGNOSIS — M79672 Pain in left foot: Secondary | ICD-10-CM

## 2017-06-04 DIAGNOSIS — E1165 Type 2 diabetes mellitus with hyperglycemia: Secondary | ICD-10-CM

## 2017-06-04 NOTE — Progress Notes (Signed)
Subjective: Alexis Farmer is a 76 y.o. Diabetic female patient presents to office with complaint of long nails and callus at left heel states that she had 1 episode of peeling skin and pain in heel. Reports that her last sugar was 184 and A1c was 9.5. Denies any other pedal complaints.   Patient Active Problem List   Diagnosis Date Noted  . Dysuria 03/22/2017  . Cervical spondylosis with radiculopathy 01/26/2017  . Cerumen impaction 01/21/2017  . Rotator cuff impingement syndrome, left 11/08/2016  . Rectal bleed 07/29/2016  . Carotid stenosis 07/19/2016  . Chronic venous insufficiency 07/19/2016  . Pain and swelling of lower leg 07/19/2016  . Bradycardia 07/17/2016  . Postmenopausal bleeding 07/17/2016  . Chronic diastolic CHF (congestive heart failure) ([place]) 05/31/2016  . Cough 01/25/2016  . Chronic fatigue 11/15/2015  . Depression 10/13/2015  . Osteoarthritis 10/13/2015  . Vertigo 10/13/2015  . Anxiety 09/13/2015  . GI bleed 08/03/2015  . Mixed hyperlipidemia 08/26/2014  . Essential hypertension, benign 04/08/2014  . Diabetes mellitus type II, uncontrolled ([place]) 04/08/2014  . CKD (chronic kidney disease) stage 3, GFR 30-59 ml/min (HCC) 04/08/2014  . Pedal edema 04/08/2014  . Morbid obesity ([place]) 04/08/2014  . Lower back pain 04/08/2014  . Diabetic polyneuropathy ([place]) 04/08/2014    Current Outpatient Medications on File Prior to Visit  Medication Sig Dispense Refill  . ALPRAZolam (XANAX) 0.5 MG tablet TAKE 1 TABLET BY [place] AT BEDTIME AS NEEDED FOR SLEEP 30 tablet 0  . B-D INS SYRINGE 0.5CC/30GX1/2" 30G X 1/2" 0.5 ML MISC USE AS DIRECTED 500 each 1  . cloNIDine (CATAPRES) 0.2 MG tablet TAKE 1 TABLET (0.2 MG TOTAL) BY MOUTH 2 (TWO) TIMES DAILY. (Patient taking differently: Take 0.2 mg by mouth 3 (three) times daily. ) 180 tablet 3  . insulin regular (NOVOLIN R) 100 units/mL injection INJECT 15 UNITS TOTAL INTO THE SKIN 3 (THREE) TIMES DAILY BEFORE MEALS. 60 mL 1  . Multiple  Vitamin (MULTIVITAMIN WITH MINERALS) TABS tablet Take 1 tablet by mouth daily.    Marland Kitchen NOVOLIN N 100 UNIT/ML injection INJECT 33 UNITS INTO THE SKIN IN THE MORNING AND 20 UNITS IN THE EVENING DAILY BEFORE A MEAL 50 mL 1  . NOVOLIN R 100 UNIT/ML injection INJECT 15 UNITS TOTAL INTO THE SKIN 3 (THREE) TIMES DAILY BEFORE MEALS. 20 mL 1  . olmesartan (BENICAR) 40 MG tablet Take 1 tablet (40 mg total) by mouth daily. 90 tablet 3  . polyethylene glycol powder (GLYCOLAX/MIRALAX) powder TAKE 17 G BY MOUTH DAILY. 1581 g 0  . rosuvastatin (CRESTOR) 10 MG tablet TAKE 1 TABLET (10 MG TOTAL) BY MOUTH DAILY. 90 tablet 3  . spironolactone (ALDACTONE) 25 MG tablet TAKE 1 TABLET (25 MG TOTAL) BY MOUTH DAILY. 90 tablet 3  . torsemide (DEMADEX) 20 MG tablet Take 1 tablet (20 mg total) by mouth 2 (two) times daily. 180 tablet 3   No current facility-administered medications on file prior to visit.     Allergies  Allergen Reactions  . Penicillins Rash    Has patient had a PCN reaction causing immediate rash, facial/tongue/throat swelling, SOB or lightheadedness with hypotension: Yes Has patient had a PCN reaction causing severe rash involving mucus membranes or skin necrosis: No Has patient had a PCN reaction that required hospitalization No Has patient had a PCN reaction occurring within the last 10 years: Yes If all of the above answers are "NO", then may proceed with Cephalosporin use.     Objective: Physical Exam  General: The patient is alert and oriented x3 in no acute distress.  Dermatology: Skin is warm, dry and supple bilateral lower extremities. Nails 1-10 are mildly elongated and thick. + minimal callus at left lateral heel without signs of infection. There is no erythema, edema, no eccymosis, no open lesions present. Integument is otherwise unremarkable.  Vascular: Dorsalis Pedis pulse and Posterior Tibial pulse are 1/4 bilateral. Capillary fill time is immediate to all digits. Trace edema bilateral.    Neurological: Grossly intact to light touch with an achilles reflex of +2/5 and a negative Tinel's sign bilateral. Protective with SWMF intact. Vibratory diminished bilateral.   Musculoskeletal: Minimal tenderness at callus on left lateral heel. No tenderness to palpation at the medial calcaneal tubercale and through the insertion of the plantar fascia on the left and right foot and at achilles insertion. No pain with compression of calcaneus bilateral. No pain with tuning fork to calcaneus bilateral. No pain with calf compression bilateral. There is decreased Ankle joint range of motion bilateral. All other joints range of motion within normal limits bilateral. Strength 5/5 in all groups bilateral. Pes planus foot type.   Assessment: Problem List Items Addressed This Visit    None    Visit Diagnoses    Pain due to onychomycosis of nail    -  Primary   Callus of heel       Uncontrolled type 2 diabetes mellitus without complication, with long-term current use of insulin (HCC)       Bilateral foot pain          Plan: -Complete examination performed.  -Mechanically debrided nails using sterile nail nipper without incident -Left heel callus minimal no debridement performed today  -Recommend continue with heel cushions -Recommend continue okeeffe healthy feet for callus and aspercreme for pain -Recommend elevation when sitting -Recommend good supportive shoes -Recommend diabetic control -Return in 10 weeks for nail and callus care or sooner if problems or issues arise.  [name], DPM

## 2017-07-01 ENCOUNTER — Ambulatory Visit: Payer: Medicare Other | Admitting: Family Medicine

## 2017-07-15 ENCOUNTER — Other Ambulatory Visit: Payer: Self-pay | Admitting: Family Medicine

## 2017-07-15 DIAGNOSIS — Z794 Long term (current) use of insulin: Principal | ICD-10-CM

## 2017-07-15 DIAGNOSIS — IMO0002 Reserved for concepts with insufficient information to code with codable children: Secondary | ICD-10-CM

## 2017-07-15 DIAGNOSIS — N183 Chronic kidney disease, stage 3 unspecified: Secondary | ICD-10-CM

## 2017-07-15 DIAGNOSIS — E1122 Type 2 diabetes mellitus with diabetic chronic kidney disease: Secondary | ICD-10-CM

## 2017-07-15 DIAGNOSIS — E1165 Type 2 diabetes mellitus with hyperglycemia: Principal | ICD-10-CM

## 2017-07-16 ENCOUNTER — Ambulatory Visit: Payer: Medicare Other | Admitting: Sports Medicine

## 2017-07-22 ENCOUNTER — Ambulatory Visit (INDEPENDENT_AMBULATORY_CARE_PROVIDER_SITE_OTHER): Payer: Medicare Other | Admitting: Vascular Surgery

## 2017-07-22 ENCOUNTER — Ambulatory Visit (INDEPENDENT_AMBULATORY_CARE_PROVIDER_SITE_OTHER): Payer: Medicare Other

## 2017-07-22 ENCOUNTER — Encounter (INDEPENDENT_AMBULATORY_CARE_PROVIDER_SITE_OTHER): Payer: Self-pay | Admitting: Vascular Surgery

## 2017-07-22 VITALS — BP 132/69 | HR 90 | Resp 18 | Ht 63.0 in | Wt 262.0 lb

## 2017-07-22 DIAGNOSIS — I739 Peripheral vascular disease, unspecified: Secondary | ICD-10-CM

## 2017-07-22 DIAGNOSIS — I6521 Occlusion and stenosis of right carotid artery: Secondary | ICD-10-CM

## 2017-07-22 DIAGNOSIS — I10 Essential (primary) hypertension: Secondary | ICD-10-CM | POA: Diagnosis not present

## 2017-07-22 DIAGNOSIS — E1165 Type 2 diabetes mellitus with hyperglycemia: Secondary | ICD-10-CM

## 2017-07-22 NOTE — Progress Notes (Signed)
MRN : [id number]  Alexis Farmer is a 76 y.o. (02-14-1941) female who presents with chief complaint of  Chief Complaint  Patient presents with  . Follow-up    1 year carotid f/u  .  History of Present Illness: The patient is seen for follow up evaluation of carotid stenosis. The carotid stenosis followed by ultrasound.   The patient denies amaurosis fugax. There is no recent history of TIA symptoms or focal motor deficits. There is no prior documented CVA.  The patient is taking enteric-coated aspirin 81 mg daily.  There is no history of migraine headaches. There is no history of seizures.  The patient has a history of coronary artery disease, no recent episodes of angina or shortness of breath. The patient denies PAD or claudication symptoms. There is a history of hyperlipidemia which is being treated with a statin.      Current Meds  Medication Sig  . ALPRAZolam (XANAX) 0.5 MG tablet TAKE 1 TABLET BY MOITH AT BEDTIME AS NEEDED FOR SLEEP  . B-D INS SYRINGE 0.5CC/30GX1/2" 30G X 1/2" 0.5 ML MISC USE AS DIRECTED  . cloNIDine (CATAPRES) 0.2 MG tablet TAKE 1 TABLET (0.2 MG TOTAL) BY MOUTH 2 (TWO) TIMES DAILY. (Patient taking differently: Take 0.2 mg by mouth 3 (three) times daily. )  . insulin NPH Human (HUMULIN N,NOVOLIN N) 100 UNIT/ML injection Inject into the skin.  Marland Kitchen insulin regular (NOVOLIN R) 100 units/mL injection INJECT 15 UNITS TOTAL INTO THE SKIN 3 (THREE) TIMES DAILY BEFORE MEALS.  . Multiple Vitamin (MULTIVITAMIN WITH MINERALS) TABS tablet Take 1 tablet by mouth daily.  Marland Kitchen olmesartan (BENICAR) 40 MG tablet Take 1 tablet (40 mg total) by mouth daily.  . Polyethylene Glycol 3350 POWD MIX 17 G IN LIQUID AND DRINK BY MOUTH DAILY.  . rosuvastatin (CRESTOR) 10 MG tablet TAKE 1 TABLET (10 MG TOTAL) BY MOUTH DAILY.  Marland Kitchen spironolactone (ALDACTONE) 25 MG tablet TAKE 1 TABLET (25 MG TOTAL) BY MOUTH DAILY.  Marland Kitchen torsemide (DEMADEX) 20 MG tablet Take 1 tablet (20 mg total) by mouth 2  (two) times daily.    Past Medical History:  Diagnosis Date  . Arthritis   . Chickenpox   . Diabetes mellitus without complication ([place])    one elevated reading/ no treatment  . Diverticulitis   . GI bleed   . High cholesterol   . History of blood transfusion   . Hypertension   . Renal insufficiency     Past Surgical History:  Procedure Laterality Date  . APPENDECTOMY    . ECTOPIC PREGNANCY SURGERY    . EYE SURGERY     bilateral cataracts  . gallbladder     . HYSTEROSCOPY W/D&C N/A 10/26/2016   Procedure: DILATATION AND CURETTAGE /HYSTEROSCOPY;  Surgeon: Benjaman Kindler, MD;  Location: [facility] ORS;  Service: Gynecology;  Laterality: N/A;  . THYROIDECTOMY, PARTIAL      Social History Social History   Tobacco Use  . Smoking status: Never Smoker  . Smokeless tobacco: Never Used  Substance Use Topics  . Alcohol use: No  . Drug use: No    Family History Family History  Problem Relation Age of Onset  . Diabetes Mother   . Hypertension Mother   . Stroke Mother   . Diabetes Other   . Diabetes Sister   . Heart disease Sister     Allergies  Allergen Reactions  . Penicillins Rash    Has patient had a PCN reaction causing immediate rash, facial/tongue/throat swelling,  SOB or lightheadedness with hypotension: Yes Has patient had a PCN reaction causing severe rash involving mucus membranes or skin necrosis: No Has patient had a PCN reaction that required hospitalization No Has patient had a PCN reaction occurring within the last 10 years: Yes If all of the above answers are "NO", then may proceed with Cephalosporin use.      REVIEW OF SYSTEMS (Negative unless checked)  Constitutional: [] Weight loss  [] Fever  [] Chills Cardiac: [] Chest pain   [] Chest pressure   [] Palpitations   [] Shortness of breath when laying flat   [] Shortness of breath with exertion. Vascular:  [x] Pain in legs with walking   [] Pain in legs at rest  [] History of DVT   [] Phlebitis   [] Swelling in  legs   [] Varicose veins   [] Non-healing ulcers Pulmonary:   [] Uses home oxygen   [] Productive cough   [] Hemoptysis   [] Wheeze  [] COPD   [] Asthma Neurologic:  [] Dizziness   [] Seizures   [] History of stroke   [] History of TIA  [] Aphasia   [] Vissual changes   [] Weakness or numbness in arm   [] Weakness or numbness in leg Musculoskeletal:   [] Joint swelling   [] Joint pain   [] Low back pain Hematologic:  [] Easy bruising  [] Easy bleeding   [] Hypercoagulable state   [] Anemic Gastrointestinal:  [] Diarrhea   [] Vomiting  [] Gastroesophageal reflux/heartburn   [] Difficulty swallowing. Genitourinary:  [] Chronic kidney disease   [] Difficult urination  [] Frequent urination   [] Blood in urine Skin:  [] Rashes   [] Ulcers  Psychological:  [] History of anxiety   []  History of major depression.  Physical Examination  Vitals:   07/22/17 0908 07/22/17 0909  BP: 123/67 132/69  Pulse: 90   Resp: 18   Weight: 262 lb (118.8 kg)   Height: 5\' 3"  (1.6 m)    Body mass index is 46.41 kg/m. Gen: WD/WN, NAD Head: [place]/AT, No temporalis wasting.  Ear/Nose/Throat: Hearing grossly intact, nares w/o erythema or drainage Eyes: PER, EOMI, sclera nonicteric.  Neck: Supple, no large masses.   Pulmonary:  Good air movement, no audible wheezing bilaterally, no use of accessory muscles.  Cardiac: RRR, no JVD Vascular: right carotid bruit Vessel Right Left  Radial Palpable Palpable  Ulnar Palpable Palpable  Brachial Palpable Palpable  Carotid Palpable Palpable  PT Not Palpable Not Palpable  DP Not Palpable Not Palpable  Gastrointestinal: Non-distended. No guarding/no peritoneal signs.  Musculoskeletal: M/S 5/5 throughout.  No deformity or atrophy.  Neurologic: CN 2-12 intact. Symmetrical.  Speech is fluent. Motor exam as listed above. Psychiatric: Judgment intact, Mood & affect appropriate for pt's clinical situation. Dermatologic: No rashes or ulcers noted.  No changes consistent with cellulitis. Lymph : No  lichenification or skin changes of chronic lymphedema.  CBC Lab Results  Component Value Date   WBC 6.7 10/23/2016   HGB 13.1 10/23/2016   HCT 39.7 10/23/2016   MCV 88.8 10/23/2016   PLT 231 10/23/2016    BMET    Component Value Date/Time   NA 137 04/11/2017 1102   NA 140 03/11/2014   K 4.0 04/11/2017 1102   CL 99 04/11/2017 1102   CO2 31 04/11/2017 1102   GLUCOSE 223 (H) 04/11/2017 1102   BUN 18 04/11/2017 1102   BUN 14 03/11/2014   CREATININE 1.24 (H) 04/11/2017 1102   CALCIUM 10.1 04/11/2017 1102   GFRNONAA 37 (L) 10/23/2016 1021   GFRAA 43 (L) 10/23/2016 1021   CrCl cannot be calculated (Patient's most recent lab result is older than the  maximum 21 days allowed.).  COAG Lab Results  Component Value Date   INR 0.98 10/08/2016    Radiology No results found.    Assessment/Plan 1. Stenosis of right carotid artery Recommend:  Given the patient's asymptomatic subcritical stenosis no further invasive testing or surgery at this time.  Continue antiplatelet therapy as prescribed Continue management of CAD, HTN and Hyperlipidemia Healthy heart diet,  encouraged exercise at least 4 times per week Follow up in 12 months with duplex ultrasound and physical exam   - VAS US CAROTID; Future  2. PAD (peripheral artery disease) (HCC)  Recommend:  The patient has evidence of atherosclerosis of the lower extremities with claudication.  The patient does not voice lifestyle limiting changes at this point in time.  Noninvasive studies do not suggest clinically significant change.  No invasive studies, angiography or surgery at this time The patient should continue walking and begin a more formal exercise program.  The patient should continue antiplatelet therapy and aggressive treatment of the lipid abnormalities  No changes in the patient's medications at this time  The patient should continue wearing graduated compression socks 10-15 mmHg strength to control the mild  edema.   - VAS [place] ABI WITH/WO TBI; Future  3. Essential hypertension, benign Continue antihypertensive medications as already ordered, these medications have been reviewed and there are no changes at this time.   4. Uncontrolled type 2 diabetes mellitus with hyperglycemia ([place]) Continue hypoglycemic medications as already ordered, these medications have been reviewed and there are no changes at this time.  Hgb A1C to be monitored as already arranged by primary service     Hortencia Pilar, MD  07/22/2017 8:52 PM

## 2017-07-26 ENCOUNTER — Telehealth: Payer: Self-pay | Admitting: Family Medicine

## 2017-07-26 MED ORDER — BLOOD GLUCOSE MONITOR KIT: PACK | 0 refills | Status: AC

## 2017-07-26 NOTE — Telephone Encounter (Signed)
faxed

## 2017-07-26 NOTE — Telephone Encounter (Signed)
Please advise 

## 2017-07-26 NOTE — Telephone Encounter (Signed)
Pt requesting a meter kit for Accu Check Guide. Pt states she needs the meter, lancets and test strips.

## 2017-07-26 NOTE — Telephone Encounter (Unsigned)
Copied from [place]. Topic: Quick Communication - See Telephone Encounter >> Jul 26, 2017 10:00 AM Hewitt Shorts wrote: Pt is checking on the status of the meter kit for accu check guide -she needs meter lancets and test strrips -pt states that this was requested yesterday and nothing is in the chart Best number [phone]  07/26/17.

## 2017-07-26 NOTE — Telephone Encounter (Signed)
Please fax and let the patient know. Thanks.

## 2017-08-11 NOTE — Progress Notes (Signed)
Cardiology Office Note  Date:  08/13/2017   ID:  Alexis Farmer, DOB [birth year], MRN [id number]  PCP:  Leone Haven, MD   Chief Complaint  Patient presents with  . other    6 month follow up. Meds reviewed by the pt. verbally. Pt. c/o right shoulder pain.     HPI:  Alexis Farmer is a 77 year old woman with history of  obesity,  insulin-dependent diabetes, Hemoglobin A1c 9.5, poor diet, hypertension,  hyperlipidemia,  moderate LVH  hospital 08/13/2014  chest pain, malignant hypertension, possible sleep apnea,  chronic kidney disease,  Anxiety Notes indicate previous history of GI bleeding  colonoscopy which was normal by her report She presents to for follow-up of her hypertension, chronic diastolic CHF, poorly controlled diabetes, CKD  Worsening renal function on torsemide 2 a day Down to one a day, takes 2 on a Monday and Friday  Cant walk very far, Moved to [place]  Down close to 20 pounds Changed her diet No sodas, cut back on her carbohydrates  Chronic nonpitting lower extremity edema Presents in a wheelchair today, unable to walk very far   Carotid u/s, results reviewed with her in detail  Right Carotid: Velocities in the right ICA are consistent with a 40-59%  stenosis. Non-hemodynamically significant plaque <50% noted in the CCA. Left Carotid: Velocities in the left ICA are consistent with a 1-39% stenosis.  EKG personally reviewed by myself on todays visit Shows normal sinus rhythm rate 75 bpm T wave abnormality 1 and aVL no change from prior EKG  Other past medical history reviewed 10/2016  D&C, hysteroscopy. Postmenopausal bleeding.   Was in hospital 07/2016 outside the system Could not see, nausea, everything looked "white" Was told heart rate was low, Metoprolol was stopped Hospital: [facility], [place]   Previous Echocardiogram 08/13/2014 showed normal ejection fraction, normal RV size and function, moderate LVH  Renal artery duplex showing no  blockage of the mid to distal vessels bilaterally, challenging study   PMH:   has a past medical history of Arthritis, Chickenpox, Diabetes mellitus without complication ([place]), Diverticulitis, GI bleed, High cholesterol, History of blood transfusion, Hypertension, and Renal insufficiency.  PSH:    Past Surgical History:  Procedure Laterality Date  . APPENDECTOMY    . ECTOPIC PREGNANCY SURGERY    . EYE SURGERY     bilateral cataracts  . gallbladder     . HYSTEROSCOPY W/D&C N/A 10/26/2016   Procedure: DILATATION AND CURETTAGE /HYSTEROSCOPY;  Surgeon: Benjaman Kindler, MD;  Location: [facility] ORS;  Service: Gynecology;  Laterality: N/A;  . THYROIDECTOMY, PARTIAL      Current Outpatient Medications  Medication Sig Dispense Refill  . ALPRAZolam (XANAX) 0.5 MG tablet TAKE 1 TABLET BY MOITH AT BEDTIME AS NEEDED FOR SLEEP 30 tablet 0  . B-D INS SYRINGE 0.5CC/30GX1/2" 30G X 1/2" 0.5 ML MISC USE AS DIRECTED 500 each 1  . blood glucose meter kit and supplies KIT Accu chek, Dx code E11.65, check 3 times daily 1 each 0  . cloNIDine (CATAPRES) 0.2 MG tablet TAKE 1 TABLET (0.2 MG TOTAL) BY MOUTH 2 (TWO) TIMES DAILY. (Patient taking differently: Take 0.2 mg by mouth 3 (three) times daily. ) 180 tablet 3  . insulin NPH Human (HUMULIN N,NOVOLIN N) 100 UNIT/ML injection Inject into the skin.    Marland Kitchen insulin regular (NOVOLIN R) 100 units/mL injection INJECT 15 UNITS TOTAL INTO THE SKIN 3 (THREE) TIMES DAILY BEFORE MEALS. 60 mL 1  . Multiple Vitamin (MULTIVITAMIN WITH MINERALS) TABS tablet  Take 1 tablet by mouth daily.    Marland Kitchen olmesartan (BENICAR) 40 MG tablet Take 1 tablet (40 mg total) by mouth daily. 90 tablet 3  . Polyethylene Glycol 3350 POWD MIX 17 G IN LIQUID AND DRINK BY MOUTH DAILY.  0  . rosuvastatin (CRESTOR) 10 MG tablet TAKE 1 TABLET (10 MG TOTAL) BY MOUTH DAILY. 90 tablet 3  . spironolactone (ALDACTONE) 25 MG tablet TAKE 1 TABLET (25 MG TOTAL) BY MOUTH DAILY. 90 tablet 3  . torsemide (DEMADEX) 20 MG  tablet Take 1 tablet (20 mg total) by mouth 2 (two) times daily. 180 tablet 3   No current facility-administered medications for this visit.      Allergies:   Penicillins   Social History:  The patient  reports that  has never smoked. she has never used smokeless tobacco. She reports that she does not drink alcohol or use drugs.   Family History:   family history includes Diabetes in her mother, other, and sister; Heart disease in her sister; Hypertension in her mother; Stroke in her mother.    Review of Systems: Review of Systems  Constitutional: Negative.   Respiratory: Positive for shortness of breath.   Cardiovascular: Positive for leg swelling.  Gastrointestinal: Negative.   Musculoskeletal: Negative.        Gait instability  Neurological: Negative.   Psychiatric/Behavioral: Negative.   All other systems reviewed and are negative.    PHYSICAL EXAM: VS:  BP 124/64 (BP Location: Left Arm, Patient Position: Sitting, Cuff Size: Large)   Pulse 75   Ht _0  (1.6 m)   Wt 263 lb 4 oz (119.4 kg)   BMI 46.63 kg/m  , BMI Body mass index is 46.63 kg/m. Constitutional:  oriented to person, place, and time. No distress. Obese, presents in a wheelchair HENT:  Head: Normocephalic and atraumatic.  Eyes:  no discharge. No scleral icterus.  Neck: Normal range of motion. Neck supple. No JVD present.  Cardiovascular: Normal rate, regular rhythm, normal heart sounds and intact distal pulses. Exam reveals no gallop and no friction rub.  Swelling, nonpitting leg edema No murmur heard. Pulmonary/Chest: Effort normal and breath sounds normal. No stridor. No respiratory distress.  no wheezes.  no rales.  no tenderness.  Abdominal: Soft.  no distension.  no tenderness.  Musculoskeletal: Normal range of motion.  no  tenderness or deformity.  Neurological:  normal muscle tone. Coordination normal. No atrophy Skin: Skin is warm and dry. No rash noted. not diaphoretic.  Psychiatric:  normal mood  and affect. behavior is normal. Thought content normal.    Recent Labs: 10/23/2016: Hemoglobin 13.1; Platelets 231 11/22/2016: TSH 2.66 03/22/2017: ALT 24 04/11/2017: BUN 18; Creatinine, Ser 1.24; Potassium 4.0; Sodium 137    Lipid Panel Lab Results  Component Value Date   CHOL 129 11/22/2016   HDL 45.20 11/22/2016   LDLCALC 48 11/22/2016   TRIG 176.0 (H) 11/22/2016      Wt Readings from Last 3 Encounters:  08/13/17 263 lb 4 oz (119.4 kg)  07/22/17 262 lb (118.8 kg)  04/17/17 267 lb 12.8 oz (121.5 kg)       ASSESSMENT AND PLAN:  Essential hypertension, benign - Plan: EKG 12-Lead Blood pressure is well controlled on today's visit. No changes made to the medications.  Mixed hyperlipidemia Cholesterol is at goal on the current lipid regimen. No changes to the medications were made.  Stable  Uncontrolled type 2 diabetes mellitus with stage 3 chronic kidney disease, with long-term current  use of insulin ([place]) Long discussion concerning her diet,  She has made radical changes now down 20 pounds Complemented her on her a change/drop Recommended she continue her current diet  CKD (chronic kidney disease) stage 3, GFR 30-59 ml/min Sees Dr. Candiss Norse On torsemide daily Stable nonpitting leg edema  Morbid obesity ([place]) Unable to exercise. Obesity hypoventilation  This may improve with continued weight loss  Chronic diastolic CHF (congestive heart failure) ([place]) Recommended she take extra torsemide as needed for shortness of breath, ankle swelling.  Stable   Total encounter time more than 25 minutes  Greater than 50% was spent in counseling and coordination of care with the patient   Disposition:   F/U  12 months   Orders Placed This Encounter  Procedures  . EKG 12-Lead     Signed, Esmond Plants, M.D., Ph.D. 08/13/2017  [place], [place]

## 2017-08-13 ENCOUNTER — Encounter: Payer: Self-pay | Admitting: Cardiovascular Disease

## 2017-08-13 ENCOUNTER — Ambulatory Visit (INDEPENDENT_AMBULATORY_CARE_PROVIDER_SITE_OTHER): Payer: Medicare Other | Admitting: Cardiovascular Disease

## 2017-08-13 VITALS — BP 124/64 | HR 75 | Ht 63.0 in | Wt 263.2 lb

## 2017-08-13 DIAGNOSIS — R600 Localized edema: Secondary | ICD-10-CM | POA: Diagnosis not present

## 2017-08-13 DIAGNOSIS — I5032 Chronic diastolic (congestive) heart failure: Secondary | ICD-10-CM | POA: Diagnosis not present

## 2017-08-13 DIAGNOSIS — E1165 Type 2 diabetes mellitus with hyperglycemia: Secondary | ICD-10-CM | POA: Diagnosis not present

## 2017-08-13 DIAGNOSIS — N183 Chronic kidney disease, stage 3 unspecified: Secondary | ICD-10-CM

## 2017-08-13 DIAGNOSIS — Z794 Long term (current) use of insulin: Secondary | ICD-10-CM

## 2017-08-13 DIAGNOSIS — E782 Mixed hyperlipidemia: Secondary | ICD-10-CM

## 2017-08-13 DIAGNOSIS — I10 Essential (primary) hypertension: Secondary | ICD-10-CM

## 2017-08-13 DIAGNOSIS — IMO0002 Reserved for concepts with insufficient information to code with codable children: Secondary | ICD-10-CM

## 2017-08-13 DIAGNOSIS — E1122 Type 2 diabetes mellitus with diabetic chronic kidney disease: Secondary | ICD-10-CM | POA: Diagnosis not present

## 2017-08-13 DIAGNOSIS — I6521 Occlusion and stenosis of right carotid artery: Secondary | ICD-10-CM | POA: Diagnosis not present

## 2017-08-13 NOTE — Patient Instructions (Signed)

## 2017-08-17 LAB — HM MAMMOGRAPHY

## 2017-08-20 ENCOUNTER — Ambulatory Visit (INDEPENDENT_AMBULATORY_CARE_PROVIDER_SITE_OTHER): Payer: Medicare Other | Admitting: Sports Medicine

## 2017-08-20 ENCOUNTER — Encounter: Payer: Self-pay | Admitting: Sports Medicine

## 2017-08-20 DIAGNOSIS — B351 Tinea unguium: Secondary | ICD-10-CM | POA: Diagnosis not present

## 2017-08-20 DIAGNOSIS — E1165 Type 2 diabetes mellitus with hyperglycemia: Secondary | ICD-10-CM

## 2017-08-20 DIAGNOSIS — L84 Corns and callosities: Secondary | ICD-10-CM

## 2017-08-20 DIAGNOSIS — M79609 Pain in unspecified limb: Secondary | ICD-10-CM

## 2017-08-20 DIAGNOSIS — IMO0001 Reserved for inherently not codable concepts without codable children: Secondary | ICD-10-CM

## 2017-08-20 DIAGNOSIS — Z794 Long term (current) use of insulin: Secondary | ICD-10-CM

## 2017-08-20 NOTE — Progress Notes (Signed)
Subjective: Kambria Havener is a 77 y.o. Diabetic female patient presents to office with complaint of long nails and callus at left heel states that it still feels like there is something on her left heel. Reports that her last sugar was 190 and A1c was 9.5. Denies any other pedal complaints.   Patient Active Problem List   Diagnosis Date Noted  . Dysuria 03/22/2017  . Cervical spondylosis with radiculopathy 01/26/2017  . Cerumen impaction 01/21/2017  . Rotator cuff impingement syndrome, left 11/08/2016  . Rectal bleed 07/29/2016  . Carotid stenosis 07/19/2016  . PAD (peripheral artery disease) ([place]) 07/19/2016  . Pain and swelling of lower leg 07/19/2016  . Bradycardia 07/17/2016  . Postmenopausal bleeding 07/17/2016  . Chronic diastolic CHF (congestive heart failure) ([place]) 05/31/2016  . Cough 01/25/2016  . Chronic fatigue 11/15/2015  . Depression 10/13/2015  . Osteoarthritis 10/13/2015  . Vertigo 10/13/2015  . Anxiety 09/13/2015  . GI bleed 08/03/2015  . Mixed hyperlipidemia 08/26/2014  . Essential hypertension, benign 04/08/2014  . Diabetes mellitus type II, uncontrolled ([place]) 04/08/2014  . CKD (chronic kidney disease) stage 3, GFR 30-59 ml/min (HCC) 04/08/2014  . Pedal edema 04/08/2014  . Morbid obesity ([place]) 04/08/2014  . Lower back pain 04/08/2014  . Diabetic polyneuropathy ([place]) 04/08/2014    Current Outpatient Medications on File Prior to Visit  Medication Sig Dispense Refill  . ALPRAZolam (XANAX) 0.5 MG tablet TAKE 1 TABLET BY MOITH AT BEDTIME AS NEEDED FOR SLEEP 30 tablet 0  . B-D INS SYRINGE 0.5CC/30GX1/2" 30G X 1/2" 0.5 ML MISC USE AS DIRECTED 500 each 1  . blood glucose meter kit and supplies KIT Accu chek, Dx code E11.65, check 3 times daily 1 each 0  . cloNIDine (CATAPRES) 0.2 MG tablet TAKE 1 TABLET (0.2 MG TOTAL) BY MOUTH 2 (TWO) TIMES DAILY. (Patient taking differently: Take 0.2 mg by mouth 3 (three) times daily. ) 180 tablet 3  . insulin NPH Human (HUMULIN  N,NOVOLIN N) 100 UNIT/ML injection Inject into the skin.    Marland Kitchen insulin regular (NOVOLIN R) 100 units/mL injection INJECT 15 UNITS TOTAL INTO THE SKIN 3 (THREE) TIMES DAILY BEFORE MEALS. 60 mL 1  . Multiple Vitamin (MULTIVITAMIN WITH MINERALS) TABS tablet Take 1 tablet by mouth daily.    Marland Kitchen olmesartan (BENICAR) 40 MG tablet Take 1 tablet (40 mg total) by mouth daily. 90 tablet 3  . Polyethylene Glycol 3350 POWD MIX 17 G IN LIQUID AND DRINK BY MOUTH DAILY.  0  . rosuvastatin (CRESTOR) 10 MG tablet TAKE 1 TABLET (10 MG TOTAL) BY MOUTH DAILY. 90 tablet 3  . spironolactone (ALDACTONE) 25 MG tablet TAKE 1 TABLET (25 MG TOTAL) BY MOUTH DAILY. 90 tablet 3  . torsemide (DEMADEX) 20 MG tablet Take 1 tablet (20 mg total) by mouth 2 (two) times daily. 180 tablet 3   No current facility-administered medications on file prior to visit.     Allergies  Allergen Reactions  . Penicillins Rash    Has patient had a PCN reaction causing immediate rash, facial/tongue/throat swelling, SOB or lightheadedness with hypotension: Yes Has patient had a PCN reaction causing severe rash involving mucus membranes or skin necrosis: No Has patient had a PCN reaction that required hospitalization No Has patient had a PCN reaction occurring within the last 10 years: Yes If all of the above answers are "NO", then may proceed with Cephalosporin use.     Objective: Physical Exam General: The patient is alert and oriented x3 in no  acute distress.  Dermatology: Skin is warm, dry and supple bilateral lower extremities. Nails 1-10 are mildly elongated and thick. No callus at left heel; No signs of infection. There is no erythema, edema, no eccymosis, no open lesions present. Integument is otherwise unremarkable.  Vascular: Dorsalis Pedis pulse and Posterior Tibial pulse are 1/4 bilateral. Capillary fill time is immediate to all digits. Trace edema bilateral.   Neurological: Grossly intact to light touch with an achilles reflex of  +2/5 and a negative Tinel's sign bilateral. Protective with SWMF intact. Vibratory diminished bilateral.   Musculoskeletal: Minimal tenderness at left heel. No tenderness to palpation at the medial calcaneal tubercale and through the insertion of the plantar fascia on the left and right foot and at achilles insertion. + Equinus. No pain with compression of calcaneus bilateral. No pain with tuning fork to calcaneus bilateral. No pain with calf compression bilateral. There is decreased Ankle joint range of motion bilateral. All other joints range of motion within normal limits bilateral. Strength 5/5 in all groups bilateral. Pes planus foot type.   Assessment: Problem List Items Addressed This Visit    None    Visit Diagnoses    Pain due to onychomycosis of nail    -  Primary   Callus of heel       Uncontrolled type 2 diabetes mellitus without complication, with long-term current use of insulin ([place])          Plan: -Complete examination performed.  -Mechanically debrided nails using sterile nail nipper without incident -Resolved left heel callus   -Recommend continue with heel cushions -Recommend continue okeeffe healthy feet for callus prevention and aspercreme for pain -Recommend daily stretching to prevent over pulling of tendons on heel -Recommend elevation when sitting for edema control -Recommend good supportive shoes -Recommend diabetic control -Return in 10 weeks for nail care or sooner if problems or issues arise.  [name], DPM

## 2017-08-23 ENCOUNTER — Other Ambulatory Visit: Payer: Self-pay

## 2017-08-23 ENCOUNTER — Encounter: Payer: Self-pay | Admitting: Family Medicine

## 2017-08-23 ENCOUNTER — Ambulatory Visit (INDEPENDENT_AMBULATORY_CARE_PROVIDER_SITE_OTHER): Payer: Medicare Other | Admitting: Family Medicine

## 2017-08-23 VITALS — BP 146/72 | HR 86 | Temp 98.1°F | Wt 266.4 lb

## 2017-08-23 DIAGNOSIS — R300 Dysuria: Secondary | ICD-10-CM

## 2017-08-23 DIAGNOSIS — I10 Essential (primary) hypertension: Secondary | ICD-10-CM | POA: Diagnosis not present

## 2017-08-23 DIAGNOSIS — F330 Major depressive disorder, recurrent, mild: Secondary | ICD-10-CM

## 2017-08-23 DIAGNOSIS — E1165 Type 2 diabetes mellitus with hyperglycemia: Secondary | ICD-10-CM | POA: Diagnosis not present

## 2017-08-23 DIAGNOSIS — I6521 Occlusion and stenosis of right carotid artery: Secondary | ICD-10-CM | POA: Diagnosis not present

## 2017-08-23 DIAGNOSIS — M4722 Other spondylosis with radiculopathy, cervical region: Secondary | ICD-10-CM | POA: Diagnosis not present

## 2017-08-23 DIAGNOSIS — E782 Mixed hyperlipidemia: Secondary | ICD-10-CM

## 2017-08-23 LAB — COMPREHENSIVE METABOLIC PANEL
ALBUMIN: 4.2 g/dL (ref 3.5–5.2)
ALK PHOS: 48 U/L (ref 39–117)
ALT: 19 U/L (ref 0–35)
AST: 19 U/L (ref 0–37)
BILIRUBIN TOTAL: 0.6 mg/dL (ref 0.2–1.2)
BUN: 17 mg/dL (ref 6–23)
CALCIUM: 10.4 mg/dL (ref 8.4–10.5)
CO2: 32 mEq/L (ref 19–32)
CREATININE: 1.07 mg/dL (ref 0.40–1.20)
Chloride: 99 mEq/L (ref 96–112)
GFR: 63.68 mL/min (ref >60.00)
Glucose, Bld: 255 mg/dL — ABNORMAL HIGH (ref 70–99)
Potassium: 4.5 mEq/L (ref 3.5–5.1)
Sodium: 139 mEq/L (ref 135–145)
TOTAL PROTEIN: 7.7 g/dL (ref 6.0–8.3)

## 2017-08-23 LAB — URINALYSIS, MICROSCOPIC ONLY
RBC / HPF: NONE SEEN (ref 0–?)
WBC UA: NONE SEEN (ref 0–?)

## 2017-08-23 LAB — POCT URINALYSIS DIPSTICK
BILIRUBIN UA: NEGATIVE
GLUCOSE UA: 500
KETONES UA: NEGATIVE
Leukocytes, UA: NEGATIVE
Nitrite, UA: NEGATIVE
Protein, UA: NEGATIVE
Spec Grav, UA: 1.01 (ref 1.010–1.025)
UROBILINOGEN UA: 0.2 U/dL
pH, UA: 6.5 (ref 5.0–8.0)

## 2017-08-23 LAB — LDL CHOLESTEROL, DIRECT: LDL DIRECT: 69 mg/dL

## 2017-08-23 LAB — HEMOGLOBIN A1C: Hgb A1c MFr Bld: 9.7 % — ABNORMAL HIGH (ref 4.6–6.5)

## 2017-08-23 LAB — TSH: TSH: 2.33 u[IU]/mL (ref 0.35–4.50)

## 2017-08-23 NOTE — Assessment & Plan Note (Signed)
Check direct LDL 

## 2017-08-23 NOTE — Assessment & Plan Note (Signed)
Symptoms concerning for UTI.  Will check urinalysis.

## 2017-08-23 NOTE — Progress Notes (Signed)
Alexis Rumps, MD Phone: [phone]  Alexis Farmer is a 77 y.o. female who presents today for f/u  DIABETES Disease Monitoring: Blood Sugar ranges-90-300 Polyuria/phagia/dipsia- no      Optho-Saw in october Medications: Compliance- taking NPH 25-33 in am and 15-20 in PM Hypoglycemic symptoms- states feeling low at night with sugars between 90-110 Foot exam with Podiatry on Tuesday.  Continues to have right-sided neck pain radiating to her right scapula.  Prior x-ray revealed concerning findings for nerve impingement.  She notes no radiation down her arm.  No numbness or weakness.  Aches like a toothache.  She has tried anti-inflammatories.  Also takes Tylenol for this.  She had lost some weight due to having some teeth pulled and eating a little healthier.  They are working on getting her a partial plate.  She reports she had an upper respiratory issue which she thinks was the flu.  Cough and rhinorrhea as well as some fevers.  This has resolved and she feels better.  She does report some dysuria with urinary odor and frequency and urgency for a little while now.  No abdominal pain.  She wonders about UTI.  Depression: Notes this is slightly worse.  She notes no significant anxiety.  She is just sad and on.  No SI.  She has been resistant to trying medication in the past due to possible risk of bleeding with SSRIs.   Social History   Tobacco Use  Smoking Status Never Smoker  Smokeless Tobacco Never Used     ROS see history of present illness  Objective  Physical Exam Vitals:   08/23/17 1051 08/23/17 1128  BP: (!) 150/78 (!) 146/72  Pulse: 86   Temp: 98.1 F (36.7 C)   SpO2: 98%     BP Readings from Last 3 Encounters:  08/23/17 (!) 146/72  08/13/17 124/64  07/22/17 132/69   Wt Readings from Last 3 Encounters:  08/23/17 266 lb 6.4 oz (120.8 kg)  08/13/17 263 lb 4 oz (119.4 kg)  07/22/17 262 lb (118.8 kg)    Physical Exam  Constitutional: No distress.    Cardiovascular: Normal rate, regular rhythm and normal heart sounds.  Pulmonary/Chest: Effort normal and breath sounds normal.  Musculoskeletal: She exhibits no edema.  No midline neck tenderness, no midline neck step-off, no trapezius or muscular neck tenderness full range of motion bilateral shoulders with no discomfort actively and passively, negative empty can, 5/5 strength in bilateral biceps, triceps, grip, quads, hamstrings, plantar and dorsiflexion, sensation to light touch intact in bilateral UE and LE  Neurological: She is alert. Gait normal.  Skin: Skin is warm and dry. She is not diaphoretic.     Assessment/Plan: Please see individual problem list.  Essential hypertension, benign Improved on recheck.  At goal for age.  Continue current regimen.  Diabetes mellitus type II, uncontrolled ([place]) Remains uncontrolled though she is having hypoglycemic symptoms at night despite not being hypoglycemic.  We will have her decrease her NPH at night to 10 units and stick with 25 units in the morning.  We will check an A1c today and see what this is and then determine the next step in management.  She has been very hesitant to increase her doses of insulin given her perceived hypoglycemic symptoms despite her not being hypoglycemic.  She had a recent foot exam with podiatry.  Reports ophthalmologic exam in October 2018.  Encouraged continued dietary changes.  Cervical spondylosis with radiculopathy Suspect symptoms are related to nerve impingement.  Discussed the next step would be an MRI to evaluate further.  She declines this and wants to monitor.  If worsens or not improving by our next visit would consider proceeding with MRI.  Dysuria Symptoms concerning for UTI.  Will check urinalysis.  Mixed hyperlipidemia Check direct LDL  Depression Slightly worsened.  No SI.  She is still hesitant to trial medication.  Will refer her for therapy.   Orders Placed This Encounter  Procedures  .  Comp Met (CMET)  . HgB A1c  . TSH  . Direct LDL  . Ambulatory referral to Psychology    Referral Priority:   Routine    Referral Type:   Psychiatric    Referral Reason:   Specialty Services Required    Requested Specialty:   Psychology    Number of Visits Requested:   1  . POCT Urinalysis Dipstick    No orders of the defined types were placed in this encounter.    Alexis Rumps, MD [place]

## 2017-08-23 NOTE — Assessment & Plan Note (Addendum)
Remains uncontrolled though she is having hypoglycemic symptoms at night despite not being hypoglycemic.  We will have her decrease her NPH at night to 10 units and stick with 25 units in the morning.  We will check an A1c today and see what this is and then determine the next step in management.  She has been very hesitant to increase her doses of insulin given her perceived hypoglycemic symptoms despite her not being hypoglycemic.  She had a recent foot exam with podiatry.  Reports ophthalmologic exam in October 2018.  Encouraged continued dietary changes.

## 2017-08-23 NOTE — Assessment & Plan Note (Signed)
Slightly worsened.  No SI.  She is still hesitant to trial medication.  Will refer her for therapy.

## 2017-08-23 NOTE — Addendum Note (Signed)
Addended by: Leeanne Rio on: 08/23/2017 02:59 PM   Modules accepted: Orders

## 2017-08-23 NOTE — Assessment & Plan Note (Signed)
Suspect symptoms are related to nerve impingement.  Discussed the next step would be an MRI to evaluate further.  She declines this and wants to monitor.  If worsens or not improving by our next visit would consider proceeding with MRI.

## 2017-08-23 NOTE — Patient Instructions (Addendum)
Nice to see you. We will check lab work today and contact you with the results. We will refer you for therapy as well. If you develop thoughts of harming yourself please be evaluated. Take NPH 25 units in the morning and 10 units at night.

## 2017-08-23 NOTE — Assessment & Plan Note (Signed)
Improved on recheck.  At goal for age.  Continue current regimen.

## 2017-08-24 LAB — URINE CULTURE
MICRO NUMBER:: 90300105
RESULT: NO GROWTH
SPECIMEN QUALITY:: ADEQUATE

## 2017-08-26 ENCOUNTER — Telehealth: Payer: Self-pay | Admitting: Family Medicine

## 2017-08-26 NOTE — Telephone Encounter (Signed)
Copied from [place] [id number]. Topic: General - Call Back - No Documentation >> Aug 26, 2017  9:40 AM Arletha Grippe wrote: Reason for CRM: pt returned call ,no crm  Please call [phone]

## 2017-08-26 NOTE — Telephone Encounter (Signed)
See result note.  

## 2017-08-26 NOTE — Telephone Encounter (Signed)
Patient is calling for lab results- notified her urine is negative for infection. No significant change in her A1c level.   Labs not in basket.

## 2017-08-29 ENCOUNTER — Other Ambulatory Visit: Payer: Self-pay

## 2017-08-29 MED ORDER — OLMESARTAN MEDOXOMIL 40 MG PO TABS: 40.0000 mg | ORAL_TABLET | Freq: Every day | ORAL | 3 refills | Status: DC

## 2017-08-29 NOTE — Telephone Encounter (Signed)
Last OV 08/23/17 last filled benicar 03/28/17 90 3rf  Alprazolam 05/31/17 30 0rf

## 2017-08-29 NOTE — Telephone Encounter (Signed)
Benicar sent to pharmacy.  Please confirm what she is taking the Xanax for.  Thanks.

## 2017-08-30 MED ORDER — ALPRAZOLAM 0.5 MG PO TABS: ORAL_TABLET | ORAL | 0 refills | Status: DC

## 2017-08-30 NOTE — Telephone Encounter (Signed)
Pt states she is taking the alprazolam for sleep. Pt states sometimes she cannot sleep, so she will take one and it helps.

## 2017-08-30 NOTE — Telephone Encounter (Signed)
Left message to return call, ok for pec to speak to patient and ask what she is taking the alprazolam for.

## 2017-08-30 NOTE — Telephone Encounter (Signed)
Noted.  Sent to pharmacy.  We will need to discuss her sleep at her next visit.  Thanks.

## 2017-09-02 ENCOUNTER — Telehealth: Payer: Self-pay | Admitting: Family Medicine

## 2017-09-02 NOTE — Telephone Encounter (Signed)
Copied from [place] [phone]. Topic: Quick Communication - See Telephone Encounter >> Sep 02, 2017 10:55 AM Ether Griffins B wrote: CRM for notification. See Telephone encounter for:  Pt is calling in stating the pharmacy didn't receive the refill request on xanax.  09/02/17.

## 2017-09-02 NOTE — Telephone Encounter (Signed)
Patient notified rx is sent to pharmacy and per pharmacy it is ready. Patients pharmacy states the benicar is on back order and they are not sure when they will have more, they said that the system is telling them it will be in today but they are not sure until they receive their orders. Patient states she has enough for 2 weeks and will let us know next Monday if the pharmacy has still not received the benicar

## 2017-09-02 NOTE — Telephone Encounter (Signed)
Noted  

## 2017-09-10 NOTE — Telephone Encounter (Signed)
Pt called in stating that she got a new RX from the pharmacy for olmesartan 20mg  2 per day. She said she had been taking olmesartan 40mg  3 per day. Please call back [phone].

## 2017-09-10 NOTE — Telephone Encounter (Signed)
Pt called to state to disregard last message, call pt if needed

## 2017-09-13 ENCOUNTER — Ambulatory Visit: Payer: Self-pay

## 2017-09-13 MED ORDER — MECLIZINE HCL 12.5 MG PO TABS
12.5000 mg | ORAL_TABLET | Freq: Three times a day (TID) | ORAL | 0 refills | Status: DC | PRN
Start: 2017-09-13 — End: 2017-11-25

## 2017-09-13 NOTE — Telephone Encounter (Signed)
Please determine if she has any nausea, vomiting, numbness, weakness, vision changes, ear fullness, congestion, fevers, hearing loss, ear fullness, ringing in her ears, or any other symptoms with this.  I would certainly suggest she be evaluated to ensure else is going on other than her typical vertigo.  It does appear she has been on meclizine previously for similar symptoms.

## 2017-09-13 NOTE — Telephone Encounter (Signed)
Pt. Reports she is having vertigo. Has had this in the past, but not treated for it. States she feels the room spinning "when I close my eyes." "It's a little better today." States she cannot come in for appointment because she does not " want to drive this way." Wants to know if provider will call "something in." Reason for Disposition . [1] MILD dizziness (e.g., vertigo; walking normally) AND [2] has NOT been evaluated by physician for this  Answer Assessment - Initial Assessment Questions 1. DESCRIPTION: "Describe your dizziness."     Room is spinning 2. VERTIGO: "Do you feel like either you or the room is spinning or tilting?"      Yes - 3. LIGHTHEADED: "Do you feel lightheaded?" (e.g., somewhat faint, woozy, weak upon standing)     No 4. SEVERITY: "How bad is it?"  "Can you walk?"   - MILD - Feels unsteady but walking normally.   - MODERATE - Feels very unsteady when walking, but not falling; interferes with normal activities (e.g., school, work) .   - SEVERE - Unable to walk without falling (requires assistance).     Mild 5. ONSET:  "When did the dizziness begin?"     Yesterday 6. AGGRAVATING FACTORS: "Does anything make it worse?" (e.g., standing, change in head position)     Closing her eyes 7. CAUSE: "What do you think is causing the dizziness?"     Vertigo 8. RECURRENT SYMPTOM: "Have you had dizziness before?" If so, ask: "When was the last time?" "What happened that time?"     Yes 9. OTHER SYMPTOMS: "Do you have any other symptoms?" (e.g., headache, weakness, numbness, vomiting, earache)     No 10. PREGNANCY: "Is there any chance you are pregnant?" "When was your last menstrual period?"       No  Protocols used: DIZZINESS - VERTIGO-A-AH

## 2017-09-13 NOTE — Addendum Note (Signed)
Addended by: Leone Haven on: 09/13/2017 12:32 PM   Modules accepted: Orders

## 2017-09-13 NOTE — Telephone Encounter (Signed)
Patient notified and voiced understanding.

## 2017-09-13 NOTE — Telephone Encounter (Signed)
Noted.  We have discussed that this previously though it has been some time ago.  Given that she has no symptoms now she should monitor.  I did send in meclizine for her to take if she has symptoms in the future.  If she has symptoms in the future and they do not resolve quickly or become persistent or are associated with any other symptoms she should be evaluated regardless of whether or not the meclizine is helpful.  Thanks.

## 2017-09-13 NOTE — Telephone Encounter (Signed)
Please advise, patient has a history of vertigo.

## 2017-09-13 NOTE — Telephone Encounter (Signed)
Patient states she feels like the room is spinning. Patient denies any other symptoms.Patient states it happened yesterday and it is gone now. She states this is the 4th episode in 2 years but doesn't mention it because it is not very frequent and when she comes in to the the office its gone.

## 2017-09-25 ENCOUNTER — Telehealth: Payer: Self-pay | Admitting: Family Medicine

## 2017-09-25 NOTE — Telephone Encounter (Signed)
Polyethylene Glycol 3350 POWD refill request  LOV 08/23/17 with Dr. Caryl Bis  [facility] [address], [place] [facility] [place].

## 2017-09-25 NOTE — Telephone Encounter (Signed)
Copied from [place] [id number]. Topic: Quick Communication - Rx Refill/Question >> Sep 25, 2017  9:33 AM Antonieta Iba C wrote: Medication: Polyethylene Glycol 3350 POWD- 3 bottles   Has the patient contacted their pharmacy? Yes   (Agent: If no, request that the patient contact the pharmacy for the refill.)  Preferred Pharmacy (with phone number or street name): [id number] [address], [place]. [phone] (Phone) [phone] (Fax)     Agent: Please be advised that RX refills may take up to 3 business days. We ask that you follow-up with your pharmacy.

## 2017-09-26 ENCOUNTER — Other Ambulatory Visit: Payer: Self-pay

## 2017-09-26 NOTE — Telephone Encounter (Signed)
Last filed under historical last OV 08/23/17

## 2017-09-26 NOTE — Telephone Encounter (Signed)
Please confirm her dosing schedule for this and find out what issues she is having with constipation.

## 2017-09-27 ENCOUNTER — Other Ambulatory Visit: Payer: Self-pay | Admitting: Family Medicine

## 2017-09-27 ENCOUNTER — Telehealth: Payer: Self-pay | Admitting: Family Medicine

## 2017-09-27 DIAGNOSIS — IMO0002 Reserved for concepts with insufficient information to code with codable children: Secondary | ICD-10-CM

## 2017-09-27 DIAGNOSIS — N183 Chronic kidney disease, stage 3 unspecified: Secondary | ICD-10-CM

## 2017-09-27 DIAGNOSIS — Z794 Long term (current) use of insulin: Principal | ICD-10-CM

## 2017-09-27 DIAGNOSIS — E1122 Type 2 diabetes mellitus with diabetic chronic kidney disease: Secondary | ICD-10-CM

## 2017-09-27 DIAGNOSIS — E1165 Type 2 diabetes mellitus with hyperglycemia: Principal | ICD-10-CM

## 2017-09-27 MED ORDER — POLYETHYLENE GLYCOL 3350 POWD: 2 refills | Status: DC

## 2017-09-27 NOTE — Telephone Encounter (Signed)
Sent to pharmacy 

## 2017-09-27 NOTE — Telephone Encounter (Signed)
Copied from [place] [phone]. Topic: Quick Communication - Rx Refill/Question >> Sep 27, 2017 10:57 AM Oliver Pila B wrote: Medication: Polyethylene Glycol 3350 POWD [[id number]]  Has the patient contacted their pharmacy? Yes.   (Agent: If no, request that the patient contact the pharmacy for the refill.) [facility]erred Pharm[facility]with phone [facility] Agent: Please be advised that RX refills may take up to 3 business days. We ask that you follow-up with your pharmacy.

## 2017-09-27 NOTE — Telephone Encounter (Signed)
Pt states that she has a bleeding disorder. If she doesn't take it, she can have trouble with stool hardening.  Pt states that all of this is in her chart & that we has prescribed it for her before. Dr. Caryl Bis name is on the bottle.  Directions: Mix 17 grams in liquid and take by mouth daily.  [facility] [address]

## 2017-09-27 NOTE — Telephone Encounter (Signed)
Patient states she wants to get 3 bottles at a time - her insurance will pay for 3.( She wants to avoid getting constipated.)

## 2017-10-03 NOTE — Telephone Encounter (Signed)
Was refilled on 09/27/17

## 2017-10-04 IMAGING — US US TRANSVAGINAL NON-OB
1 series · 14 of 25 positions shown · non-contrast
Comparison: 07/16/2016

CLINICAL DATA: Postmenopausal bleeding

EXAM:
TRANSABDOMINAL AND TRANSVAGINAL ULTRASOUND OF PELVIS
TECHNIQUE: Both transabdominal and transvaginal ultrasound examinations of the
pelvis were performed. Transabdominal technique was performed for
global imaging of the pelvis including uterus, ovaries, adnexal
regions, and pelvic cul-de-sac. It was necessary to proceed with
endovaginal exam following the transabdominal exam to visualize the
ovaries and endometrium.

[Series 1: us transvaginal non-ob · 0.32mm/px · 48 acquisitions, 14 frames shown]
[im 1/48]
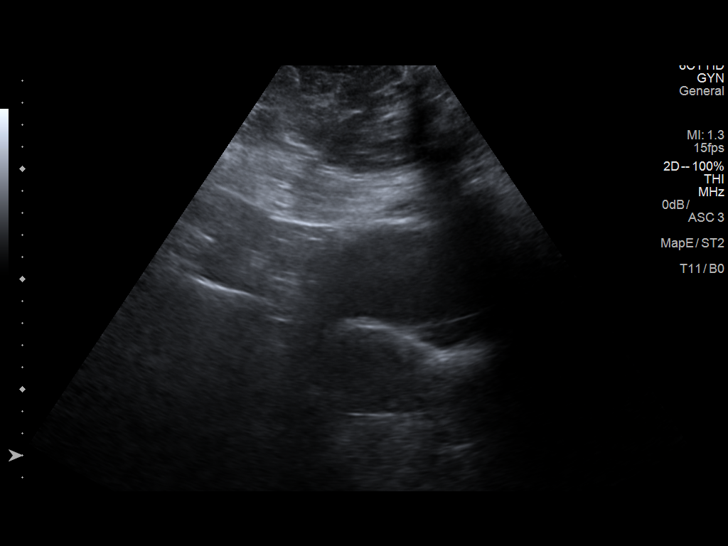
[im 4/48]
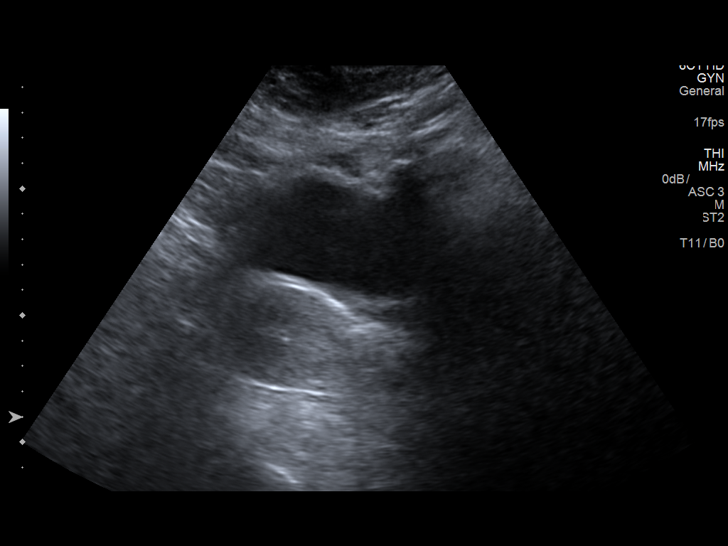
[im 8/48]
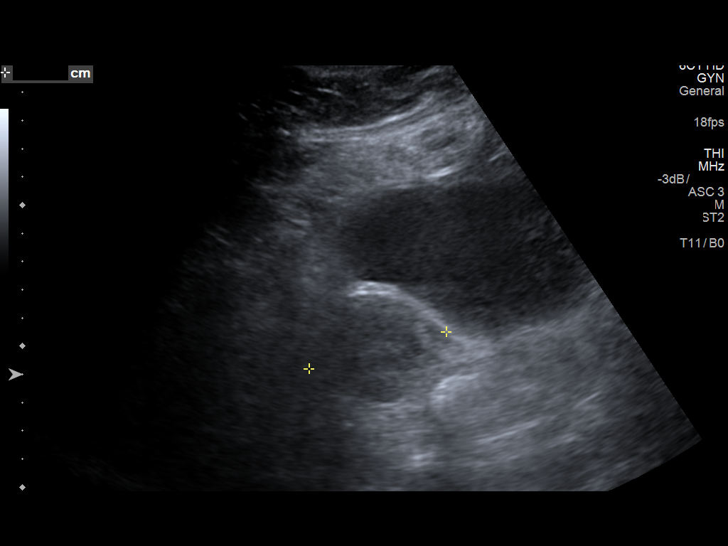
[im 12/48]
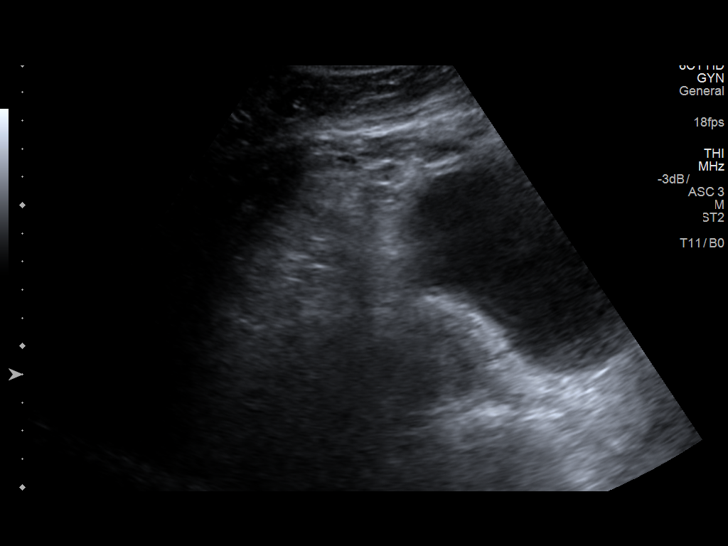
[im 16/48]
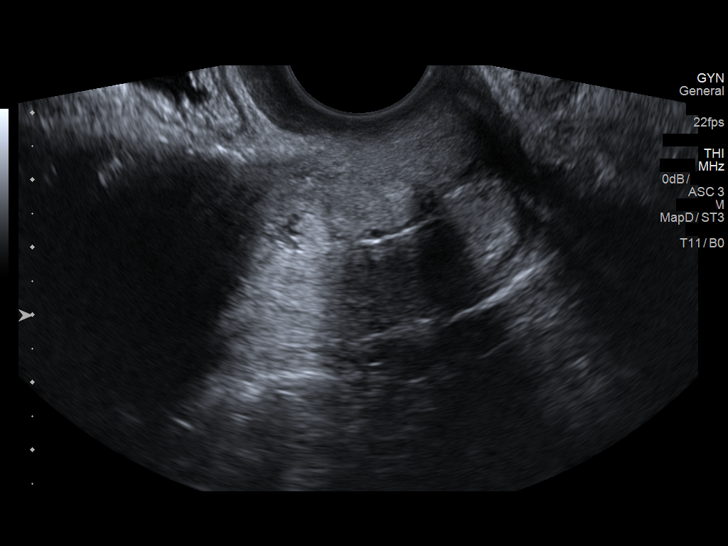
[im 18/48]
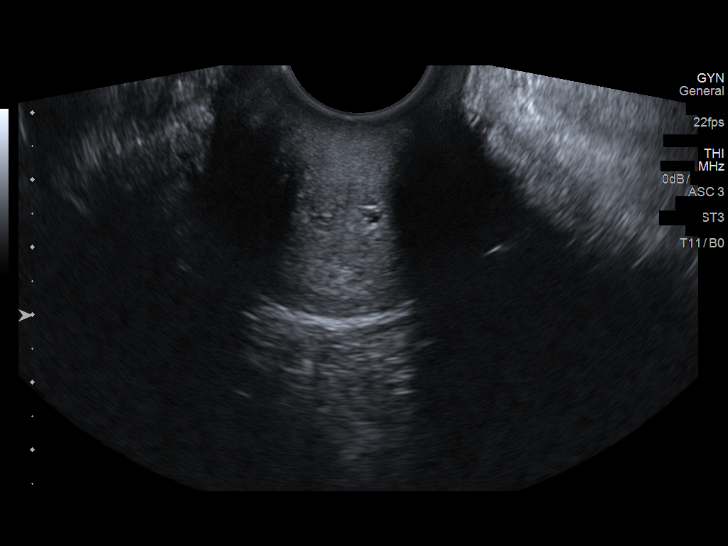
[im 22/48]
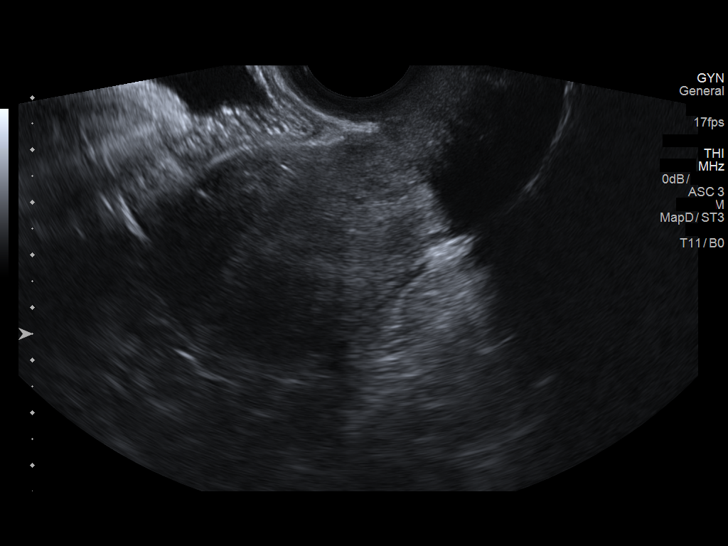
[im 26/48]
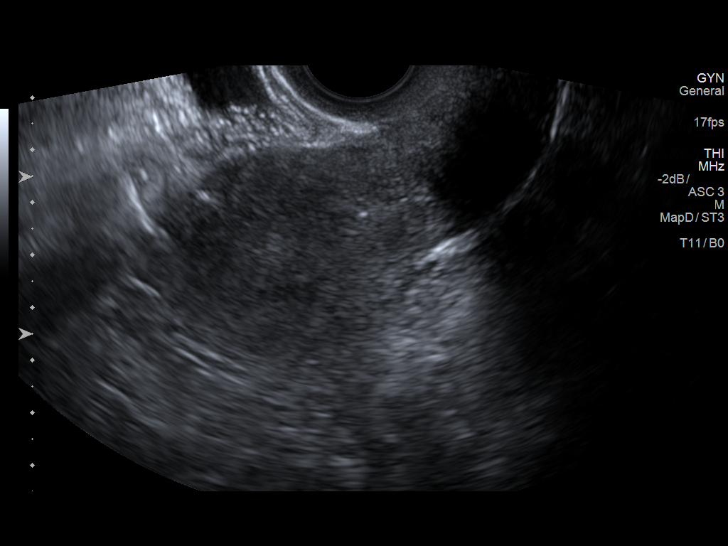
[im 30/48]
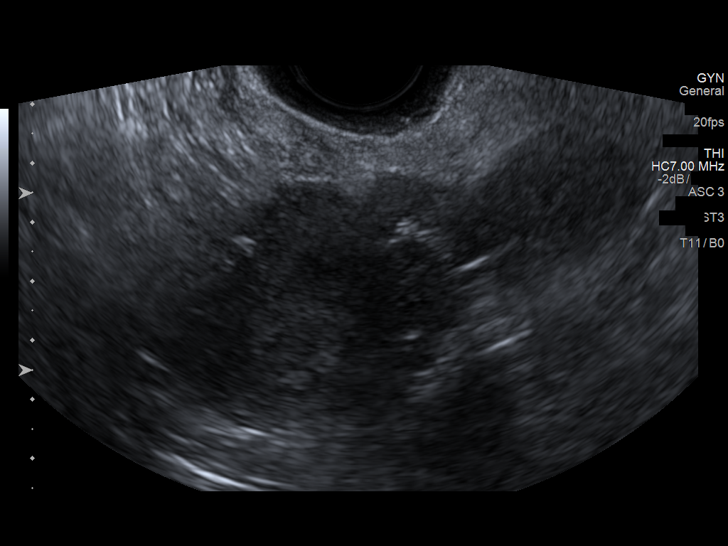
[im 32/48]
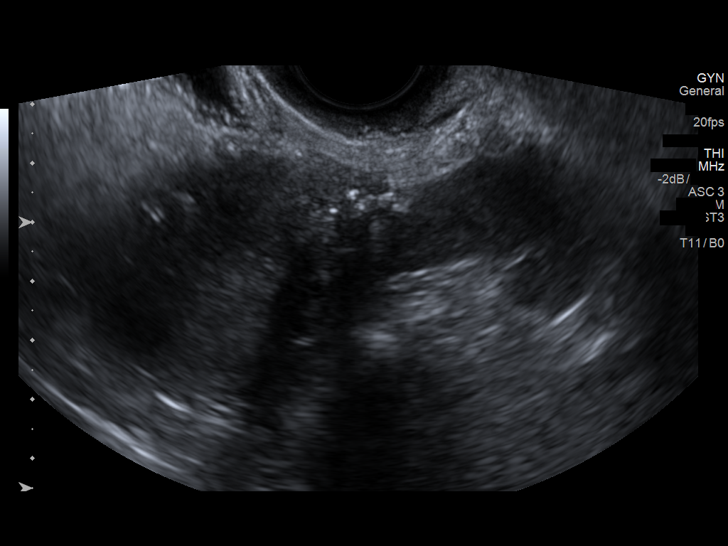
[im 36/48]
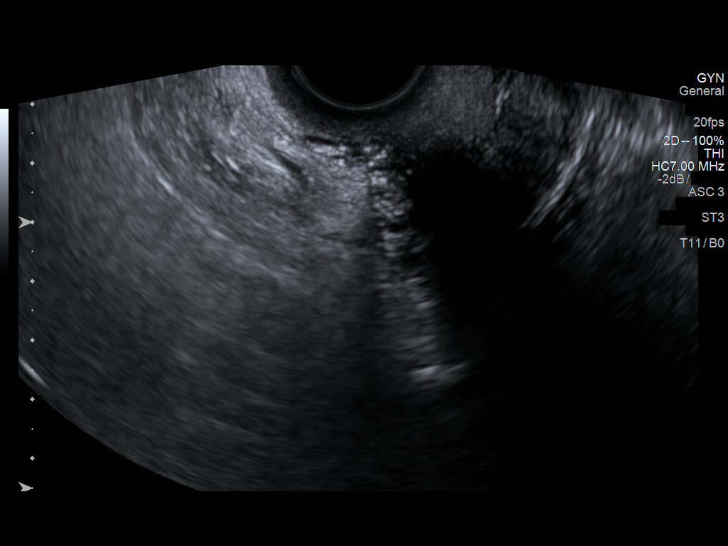
[im 40/48]
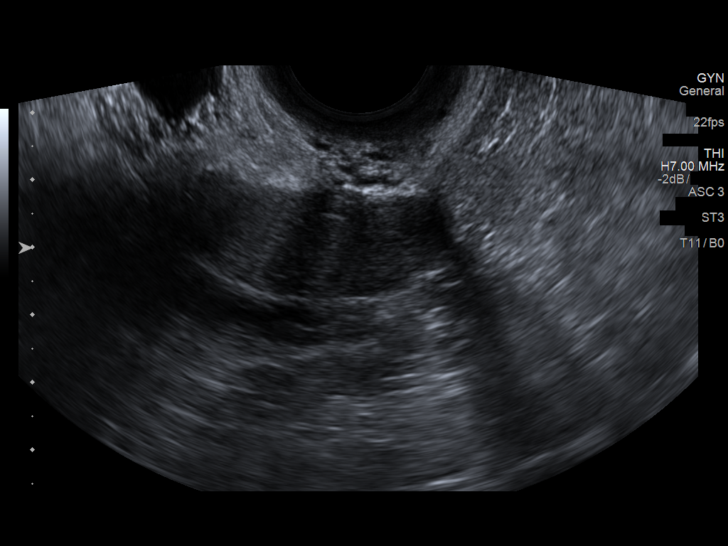
[im 44/48]
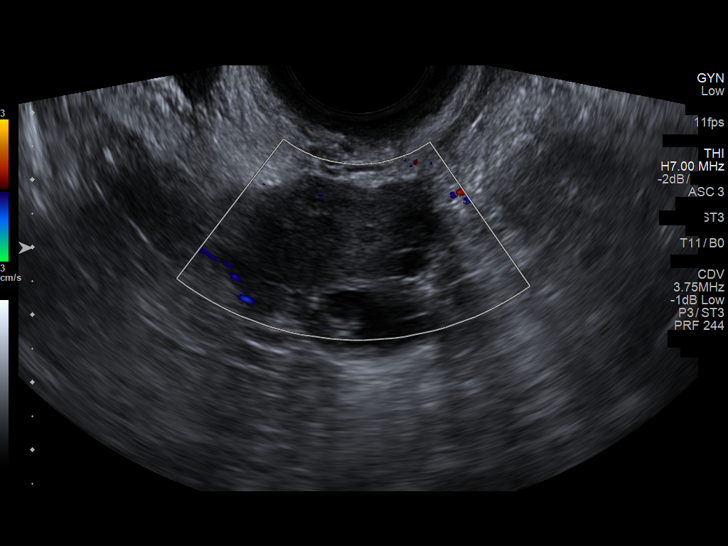
[im 48/48]
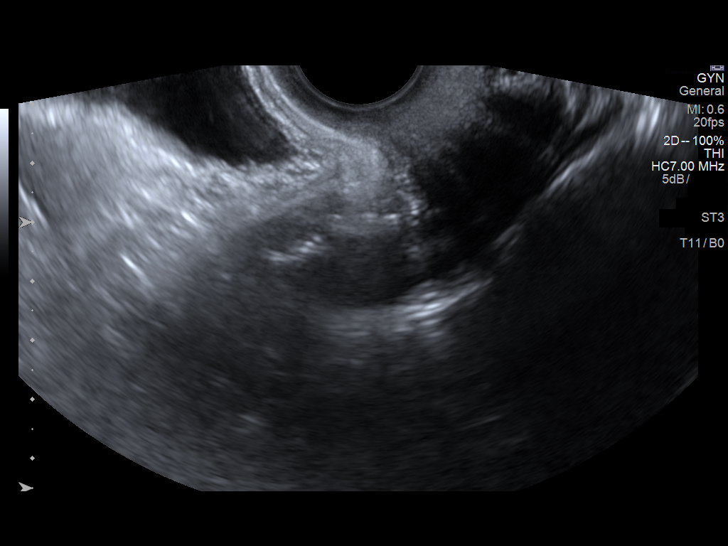

[14 of 25 positions shown; findings below may reference images not displayed]

FINDINGS: Uterus

Measurements: 8.9 x 4.2 x 5.0 cm.. No fibroids or other mass
visualized. Multiple nabothian cysts are noted in the cervix.

Endometrium

Thickness: 8 mm..  No focal abnormality visualized.

Right ovary

Not visualized

Left ovary

Measurements: 4.0 x 1.5 x 2.8 cm.. Normal appearance/no adnexal
mass.

Other findings

No abnormal free fluid.
IMPRESSION: Nonvisualization of the right ovary.

Normal-appearing endometrium decreased from the prior exam. No other
focal abnormality is seen.

## 2017-10-09 ENCOUNTER — Telehealth: Payer: Self-pay | Admitting: Family Medicine

## 2017-10-09 ENCOUNTER — Other Ambulatory Visit: Payer: Self-pay | Admitting: Family Medicine

## 2017-10-09 DIAGNOSIS — Z794 Long term (current) use of insulin: Principal | ICD-10-CM

## 2017-10-09 DIAGNOSIS — N183 Chronic kidney disease, stage 3 unspecified: Secondary | ICD-10-CM

## 2017-10-09 DIAGNOSIS — E1165 Type 2 diabetes mellitus with hyperglycemia: Principal | ICD-10-CM

## 2017-10-09 DIAGNOSIS — E1122 Type 2 diabetes mellitus with diabetic chronic kidney disease: Secondary | ICD-10-CM

## 2017-10-09 DIAGNOSIS — IMO0002 Reserved for concepts with insufficient information to code with codable children: Secondary | ICD-10-CM

## 2017-10-09 MED ORDER — INSULIN REGULAR HUMAN 100 UNIT/ML IJ SOLN: INTRAMUSCULAR | 1 refills | Status: DC

## 2017-10-09 NOTE — Telephone Encounter (Signed)
Last OV 0308/19 last filled 07/17/17 60 1rf Patient would like 90 day supply

## 2017-10-09 NOTE — Telephone Encounter (Signed)
Copied from [place] [phone]. Topic: Quick Communication - See Telephone Encounter >> Oct 09, 2017 10:20 AM Ivar Drape wrote: CRM for notification. See Telephone encounter for: 10/09/17. Patient stated that her insulin regular (NOVOLIN R) 100 units/mL injection [facility] [address] but it was a 30day supply.  It MUST ALWAYS BE A 90 DAY SUPPLY, or her insurance company will not pay for it.  All future prescriptions should be a 90day supply also. Patient is in the store now.

## 2017-10-09 NOTE — Telephone Encounter (Signed)
Copied from [place]. Topic: Quick Communication - Rx Refill/Question >> Oct 09, 2017 11:07 AM Ether Griffins B wrote: Medication: spironolactone (ALDACTONE) 25 MG tablet  Has the patient contacted their pharmacy? Yes.   (Agent: If no, request that the patient contact the pharmacy for the refill.) Preferred Pharmacy (with phone number or street name): [id number] [place], [place]. Agent: Please be advised that RX refills may take up to 3 business days. We ask that you follow-up with your pharmacy.

## 2017-10-09 NOTE — Addendum Note (Signed)
Addended by: Juanda Chance on: 10/09/2017 10:42 AM   Modules accepted: Orders

## 2017-10-10 MED ORDER — INSULIN REGULAR HUMAN 100 UNIT/ML IJ SOLN: INTRAMUSCULAR | 1 refills | Status: DC

## 2017-10-10 NOTE — Telephone Encounter (Signed)
Pt. Requested a 90 day supply on Novolin R Insulin @ 100 units/ ml.  Last office visit was 08/23/17.  Last Hgb. A1C was 9.7 on 08/23/17.  Will refill 90 day supply per protocol.

## 2017-10-11 MED ORDER — SPIRONOLACTONE 25 MG PO TABS: 25.0000 mg | ORAL_TABLET | Freq: Every day | ORAL | 3 refills | Status: DC

## 2017-10-25 ENCOUNTER — Other Ambulatory Visit: Payer: Self-pay | Admitting: Family Medicine

## 2017-10-25 ENCOUNTER — Telehealth: Payer: Self-pay | Admitting: Family Medicine

## 2017-10-25 MED ORDER — GLUCOSE BLOOD VI STRP: ORAL_STRIP | 12 refills | Status: DC

## 2017-10-25 NOTE — Telephone Encounter (Signed)
Pt requesting AccU-Chek strips guide refill  LOV 08/23/17 NOV  11/25/17  Provider  Dr. Caryl Bis  [facility] [phone] [place]

## 2017-10-25 NOTE — Telephone Encounter (Signed)
Pharmacy states they do not have accu check active test strips, please send in alternative

## 2017-10-25 NOTE — Telephone Encounter (Signed)
Sent to pharmacy 

## 2017-10-25 NOTE — Telephone Encounter (Signed)
Refill sent as requested. 

## 2017-10-25 NOTE — Telephone Encounter (Signed)
Copied from [place] [id number]. Topic: Quick Communication - Rx Refill/Question >> Oct 25, 2017 11:35 AM Neva Seat wrote: AccU-chek test strips duide  Needing refills   [id number] [address], [place] [address] Phone: [phone] Fax: [phone]

## 2017-10-29 ENCOUNTER — Other Ambulatory Visit: Payer: Self-pay

## 2017-10-29 ENCOUNTER — Other Ambulatory Visit: Payer: Self-pay | Admitting: Family Medicine

## 2017-10-29 ENCOUNTER — Ambulatory Visit: Payer: Commercial Indemnity | Admitting: Sports Medicine

## 2017-10-29 DIAGNOSIS — E1165 Type 2 diabetes mellitus with hyperglycemia: Secondary | ICD-10-CM

## 2017-10-29 MED ORDER — GLUCOSE BLOOD VI STRP: ORAL_STRIP | 12 refills | Status: DC

## 2017-10-29 NOTE — Telephone Encounter (Signed)
New rx has been sent with diagnosis code.

## 2017-10-29 NOTE — Telephone Encounter (Signed)
Pt is needing to pick up test strips. The pharmacy is needing a diagnosis code in order to process with medicare. She states the pharmacy is sending over a request as well. She hasnt checked her blood in two days.

## 2017-10-30 ENCOUNTER — Other Ambulatory Visit: Payer: Self-pay

## 2017-10-30 DIAGNOSIS — E1165 Type 2 diabetes mellitus with hyperglycemia: Secondary | ICD-10-CM

## 2017-10-30 MED ORDER — GLUCOSE BLOOD VI STRP: ORAL_STRIP | 12 refills | Status: DC

## 2017-10-30 NOTE — Telephone Encounter (Signed)
Patient states she called the pharmacy today and they still will not let her pick up the medication. Patient is crying and upset because she isn't able to check her sugar. Please advise.

## 2017-10-30 NOTE — Telephone Encounter (Signed)
Faxed patients rx for test strips with diagnosis code.

## 2017-10-31 ENCOUNTER — Other Ambulatory Visit: Payer: Self-pay

## 2017-11-19 ENCOUNTER — Other Ambulatory Visit: Payer: Self-pay

## 2017-11-19 MED ORDER — "SYRINGE/NEEDLE (DISP) 30G X 1/2"" 1 ML MISC": 5 refills | Status: DC

## 2017-11-25 ENCOUNTER — Encounter: Payer: Self-pay | Admitting: Family Medicine

## 2017-11-25 ENCOUNTER — Ambulatory Visit (INDEPENDENT_AMBULATORY_CARE_PROVIDER_SITE_OTHER): Payer: Medicare Other | Admitting: Family Medicine

## 2017-11-25 VITALS — BP 118/60 | HR 70 | Temp 99.0°F | Ht 63.0 in | Wt 267.4 lb

## 2017-11-25 DIAGNOSIS — F330 Major depressive disorder, recurrent, mild: Secondary | ICD-10-CM

## 2017-11-25 DIAGNOSIS — M4722 Other spondylosis with radiculopathy, cervical region: Secondary | ICD-10-CM

## 2017-11-25 DIAGNOSIS — E1165 Type 2 diabetes mellitus with hyperglycemia: Secondary | ICD-10-CM

## 2017-11-25 DIAGNOSIS — I10 Essential (primary) hypertension: Secondary | ICD-10-CM | POA: Diagnosis not present

## 2017-11-25 DIAGNOSIS — I6521 Occlusion and stenosis of right carotid artery: Secondary | ICD-10-CM | POA: Diagnosis not present

## 2017-11-25 LAB — POCT GLYCOSYLATED HEMOGLOBIN (HGB A1C): HEMOGLOBIN A1C: 9.7 % — AB (ref 4.0–5.6)

## 2017-11-25 MED ORDER — "SYRINGE/NEEDLE (DISP) 30G X 1/2"" 1 ML MISC": 5 refills | Status: DC

## 2017-11-25 MED ORDER — ROSUVASTATIN CALCIUM 10 MG PO TABS: ORAL_TABLET | ORAL | 3 refills | Status: DC

## 2017-11-25 MED ORDER — OLMESARTAN MEDOXOMIL 40 MG PO TABS: 40.0000 mg | ORAL_TABLET | Freq: Every day | ORAL | 3 refills | Status: DC

## 2017-11-25 MED ORDER — INSULIN NPH (HUMAN) (ISOPHANE) 100 UNIT/ML ~~LOC~~ SUSP: 15.0000 [IU] | Freq: Two times a day (BID) | SUBCUTANEOUS | 1 refills | Status: DC

## 2017-11-25 MED ORDER — SPIRONOLACTONE 25 MG PO TABS: 25.0000 mg | ORAL_TABLET | Freq: Every day | ORAL | 3 refills | Status: DC

## 2017-11-25 MED ORDER — TORSEMIDE 20 MG PO TABS: 20.0000 mg | ORAL_TABLET | Freq: Two times a day (BID) | ORAL | 3 refills | Status: DC

## 2017-11-25 NOTE — Patient Instructions (Addendum)
Nice to see you. We will check her A1c and contact you with the results. Please proceed with the MRI as planned through your specialist. Please continue your current diabetic and blood pressure medication regimens. If you develop thoughts of hurting yourself please go to the emergency room.

## 2017-11-25 NOTE — Progress Notes (Addendum)
Tommi Rumps, MD Phone: [phone]  Alexis Farmer is a 77 y.o. female who presents today for f/u.  CC: dm, htn, depression  DIABETES Disease Monitoring: Blood Sugar ranges-up and down, 70-188 Polyuria/phagia/dipsia- polyuria, nocturia x2      Optho- see's next week Medications: Compliance- taking NPH 25 u am and 15-20 u pm, novolin R 15 U TID with meals, though holds doses if she does not eat Hypoglycemic symptoms- yes if CBG <130, eats and improvs  HYPERTENSION  Disease Monitoring  Home BP Monitoring not checking Chest pain- no    Dyspnea- no Medications  Compliance-  Taking olmesartan, spironolactone, torsemide, clonidine.  Edema- chronic in left ankle, no new swelling or changes  Depression: Notes she feels depressed most of the time.  She notes no SI.  She did not go to therapy.  She does not think she needs to do this.  Neck pain: She continues to have intermittent neck pain.  Shoots down her right arm at times.  She has seen a specialist for this and they are planning on doing an MRI.  Occasionally she will have numbness when the pain shoots into her hand though otherwise no numbness or weakness.     Social History   Tobacco Use  Smoking Status Never Smoker  Smokeless Tobacco Never Used     ROS see history of present illness  Objective  Physical Exam Vitals:   11/25/17 1016 11/25/17 1059  BP: (!) 154/76 118/60  Pulse: 70   Temp: 99 F (37.2 C)   SpO2: 97%     BP Readings from Last 3 Encounters:  11/25/17 118/60  08/23/17 (!) 146/72  08/13/17 124/64   Wt Readings from Last 3 Encounters:  11/25/17 267 lb 6.4 oz (121.3 kg)  08/23/17 266 lb 6.4 oz (120.8 kg)  08/13/17 263 lb 4 oz (119.4 kg)    Physical Exam  Constitutional: No distress.  Cardiovascular: Normal rate, regular rhythm and normal heart sounds.  Pulmonary/Chest: Effort normal and breath sounds normal.  Musculoskeletal: She exhibits no edema.  No midline neck tenderness, no midline  neck step-off, no muscular neck tenderness  Neurological: She is alert.  5/5 strength in bilateral biceps, triceps, grip, quads, hamstrings, plantar and dorsiflexion, sensation to light touch intact in bilateral UE and LE  Skin: Skin is warm and dry. She is not diaphoretic.     Assessment/Plan: Please see individual problem list.  Essential hypertension, benign Well-controlled on recheck.  Continue current regimen.  Diabetes mellitus type II, uncontrolled ([place]) Remains uncontrolled.  A1c is unchanged.  She has been hesitant to alter her regimen.  She is afraid of blood sugars dropping low overnight.  She does not want to see our clinical pharmacist or endocrinology.  She does not want to make any changes to her regimen.  Depression Unchanged.  She was never contacted about the therapy referral.  She declines referral today.  Does not want medication.  She will monitor.  Given return precautions.  Cervical spondylosis with radiculopathy Continues to have issues with this.  She is following with a specialist and it sounds as though they are planning on an MRI.  Benign exam today.   Health Maintenance: Plan for mammograms every 2 years based on discussion with patient.  Prior colonoscopy in 2016 based on review of GI note. Hemorrhoids and diverticuli noted. No rectal bleeding at this time.   Orders Placed This Encounter  Procedures  . POCT HgB A1C    Meds ordered this encounter  Medications  . olmesartan (BENICAR) 40 MG tablet    Sig: Take 1 tablet (40 mg total) by mouth daily.    Dispense:  90 tablet    Refill:  3  . rosuvastatin (CRESTOR) 10 MG tablet    Sig: TAKE 1 TABLET (10 MG TOTAL) BY MOUTH DAILY.    Dispense:  90 tablet    Refill:  3  . spironolactone (ALDACTONE) 25 MG tablet    Sig: Take 1 tablet (25 mg total) by mouth daily.    Dispense:  90 tablet    Refill:  3  . Syringe/Needle, Disp, (B-D ECLIPSE SYRINGE) 30G X 1/2" 1 ML MISC    Sig: Use as directed DX:E11.65     Dispense:  50 each    Refill:  5  . torsemide (DEMADEX) 20 MG tablet    Sig: Take 1 tablet (20 mg total) by mouth 2 (two) times daily.    Dispense:  180 tablet    Refill:  3  . insulin NPH Human (HUMULIN N,NOVOLIN N) 100 UNIT/ML injection    Sig: Inject 0.15-0.25 mLs (15-25 Units total) into the skin 2 (two) times daily before a meal. Take 25 units in the morning and take 15-20 units in the evening.    Dispense:  10 mL    Refill:  [place], MD [place]

## 2017-11-25 NOTE — Assessment & Plan Note (Signed)
Unchanged.  She was never contacted about the therapy referral.  She declines referral today.  Does not want medication.  She will monitor.  Given return precautions.

## 2017-11-25 NOTE — Assessment & Plan Note (Signed)
Continues to have issues with this.  She is following with a specialist and it sounds as though they are planning on an MRI.  Benign exam today.

## 2017-11-25 NOTE — Assessment & Plan Note (Addendum)
Remains uncontrolled.  A1c is unchanged.  She has been hesitant to alter her regimen.  She is afraid of blood sugars dropping low overnight.  She does not want to see our clinical pharmacist or endocrinology.  She does not want to make any changes to her regimen.

## 2017-11-25 NOTE — Assessment & Plan Note (Signed)
Well-controlled on recheck.  Continue current regimen.

## 2017-11-29 ENCOUNTER — Other Ambulatory Visit: Payer: Self-pay | Admitting: Physical Medicine and Rehabilitation

## 2017-11-29 DIAGNOSIS — M5412 Radiculopathy, cervical region: Secondary | ICD-10-CM

## 2017-12-11 ENCOUNTER — Other Ambulatory Visit: Payer: Self-pay

## 2017-12-11 ENCOUNTER — Ambulatory Visit: Payer: Medicare Other

## 2017-12-11 ENCOUNTER — Telehealth: Payer: Self-pay | Admitting: *Deleted

## 2017-12-11 DIAGNOSIS — Z794 Long term (current) use of insulin: Principal | ICD-10-CM

## 2017-12-11 DIAGNOSIS — E1122 Type 2 diabetes mellitus with diabetic chronic kidney disease: Secondary | ICD-10-CM

## 2017-12-11 DIAGNOSIS — N183 Chronic kidney disease, stage 3 unspecified: Secondary | ICD-10-CM

## 2017-12-11 DIAGNOSIS — E1165 Type 2 diabetes mellitus with hyperglycemia: Principal | ICD-10-CM

## 2017-12-11 DIAGNOSIS — IMO0002 Reserved for concepts with insufficient information to code with codable children: Secondary | ICD-10-CM

## 2017-12-11 MED ORDER — INSULIN REGULAR HUMAN 100 UNIT/ML IJ SOLN: INTRAMUSCULAR | 1 refills | Status: DC

## 2017-12-11 MED ORDER — "SYRINGE/NEEDLE (DISP) 30G X 1/2"" 1 ML MISC": 1 refills | Status: DC

## 2017-12-11 NOTE — Telephone Encounter (Signed)
Patient states she had requested 90 day refills due to insurance.  Insulin needles need to be 90 days Novolin 90 day

## 2017-12-11 NOTE — Telephone Encounter (Signed)
Sent to pharmacy 

## 2017-12-11 NOTE — Telephone Encounter (Signed)
Pt states she is calling about her medications. Left message informing pt I had reviewed Dr. Leeanne Rio treatment plan for her and she had recommended OTC [place] Healthy Feet and for the pain Aspercreme, and if she had other questions to call again.

## 2017-12-12 ENCOUNTER — Telehealth: Payer: Self-pay | Admitting: *Deleted

## 2017-12-12 ENCOUNTER — Ambulatory Visit
Admission: RE | Admit: 2017-12-12 | Discharge: 2017-12-12 | Disposition: A | Discharge status: Home / Self Care | Payer: Medicare Other | Source: Ambulatory Visit | Attending: Physical Medicine and Rehabilitation | Admitting: Physical Medicine and Rehabilitation

## 2017-12-12 DIAGNOSIS — M4722 Other spondylosis with radiculopathy, cervical region: Secondary | ICD-10-CM | POA: Diagnosis not present

## 2017-12-12 DIAGNOSIS — M4802 Spinal stenosis, cervical region: Secondary | ICD-10-CM | POA: Insufficient documentation

## 2017-12-12 DIAGNOSIS — E079 Disorder of thyroid, unspecified: Secondary | ICD-10-CM | POA: Insufficient documentation

## 2017-12-12 DIAGNOSIS — M5412 Radiculopathy, cervical region: Secondary | ICD-10-CM | POA: Diagnosis present

## 2017-12-12 NOTE — Telephone Encounter (Signed)
Pt states she missed a call and request a call back.

## 2017-12-12 NOTE — Telephone Encounter (Signed)
I called pt and she said spoke with someone already.

## 2017-12-20 MED ORDER — INSULIN NPH (HUMAN) (ISOPHANE) 100 UNIT/ML ~~LOC~~ SUSP: 15.0000 [IU] | Freq: Two times a day (BID) | SUBCUTANEOUS | 0 refills | Status: DC

## 2017-12-20 NOTE — Telephone Encounter (Signed)
Faxed 3 bottles as requested.

## 2017-12-20 NOTE — Telephone Encounter (Addendum)
Pt was given one bottle of novolin N . Pt needs 3 bottles for insurance  Please resend

## 2017-12-20 NOTE — Addendum Note (Signed)
Addended by: Nanci Pina on: 12/20/2017 02:36 PM   Modules accepted: Orders

## 2018-01-02 ENCOUNTER — Telehealth (INDEPENDENT_AMBULATORY_CARE_PROVIDER_SITE_OTHER): Payer: Self-pay | Admitting: Vascular Surgery

## 2018-01-02 NOTE — Telephone Encounter (Signed)
I left the patient a message on voicemail to call the office back

## 2018-01-02 NOTE — Telephone Encounter (Signed)
This patient called and wanted to know if Dr. Delana Meyer has received her MRI results from her othropedic doctor.

## 2018-01-08 ENCOUNTER — Telehealth: Payer: Self-pay

## 2018-01-08 IMAGING — CR DG CHEST 2V
1 series · 2 of 2 positions shown · non-contrast
Comparison: PA and lateral chest x-ray April 30, 2016

CLINICAL DATA: Three months of shortness of breath with productive
cough and wheezing. History of diabetes and hypertension.

EXAM:
CHEST  2 VIEW

[Series 1: dg chest 2 view · 0.14mm/px · 2 of 2 slices shown]
[im 1/2]
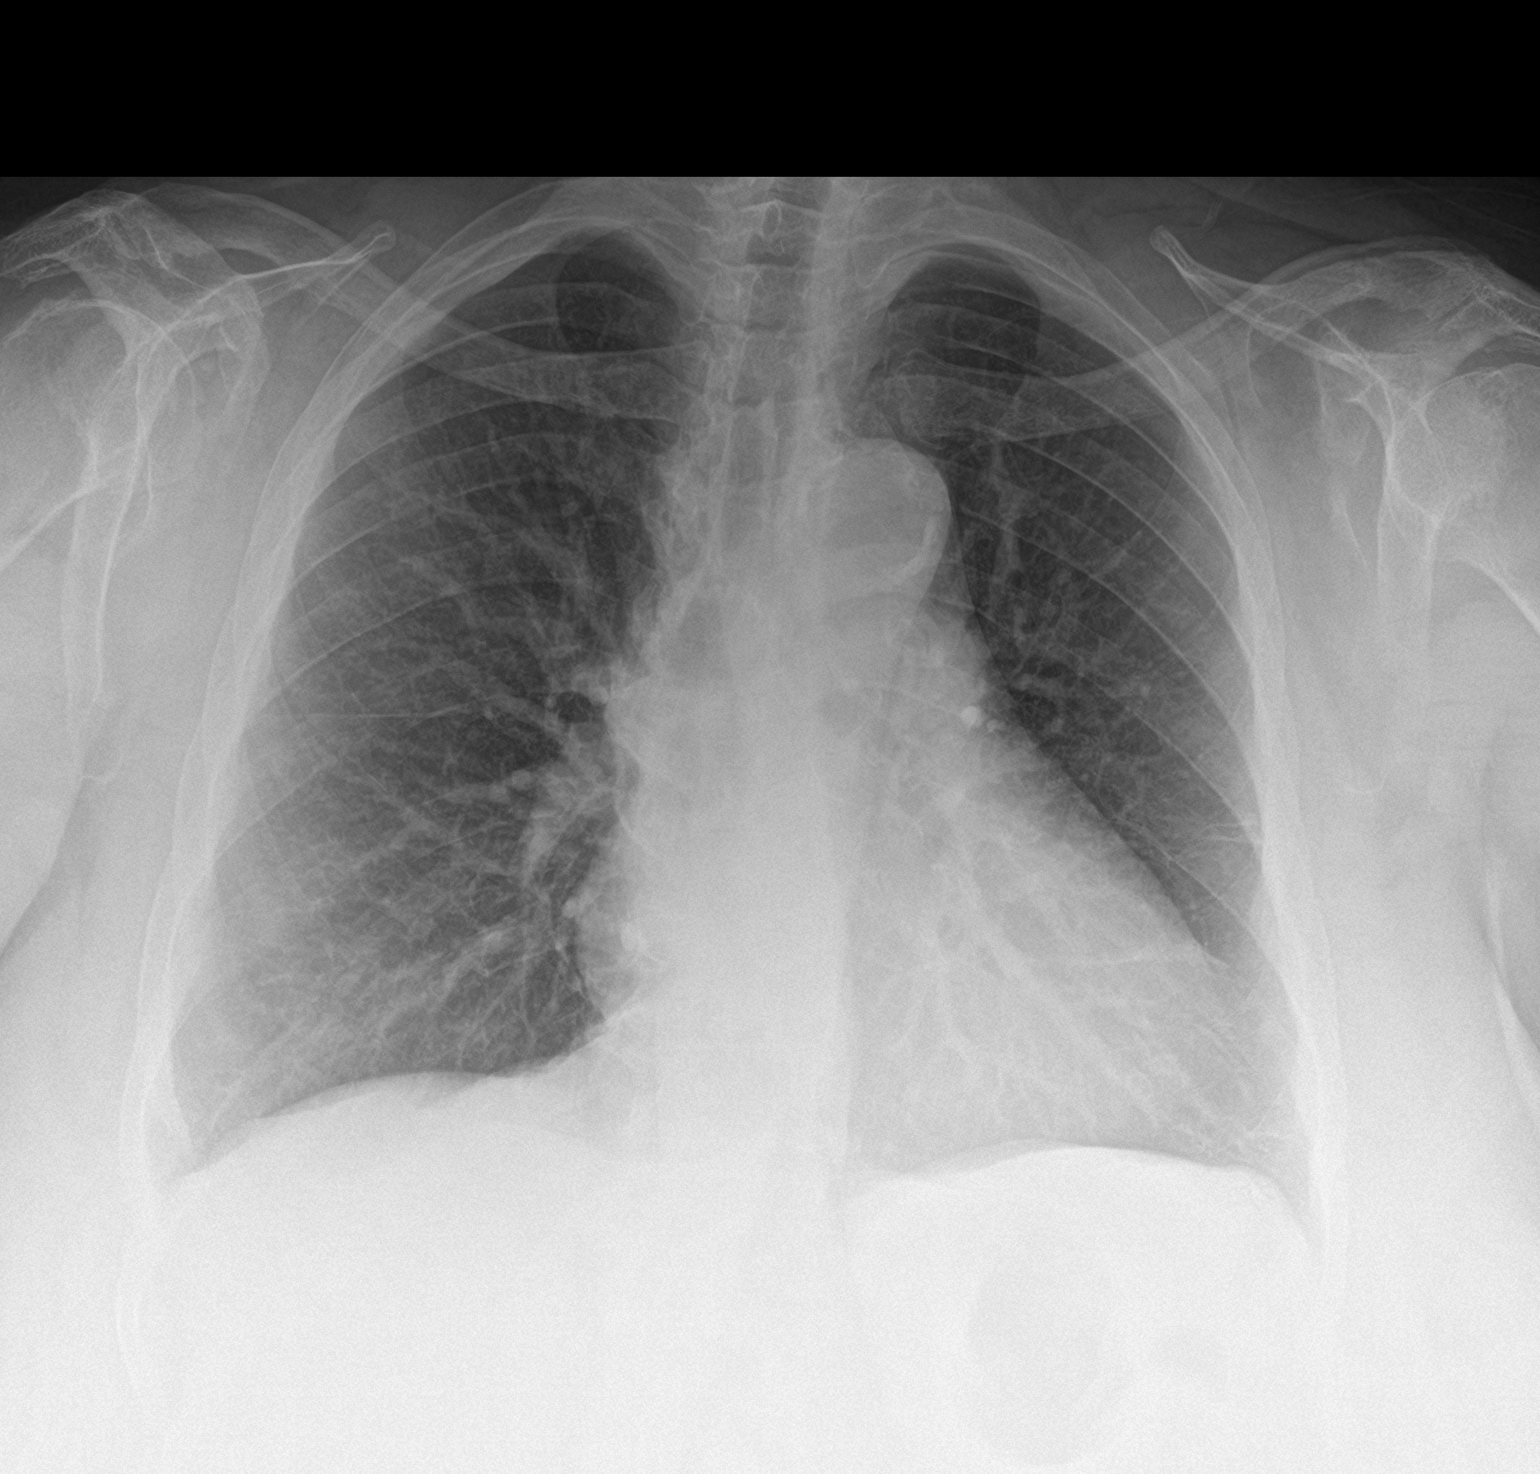
[im 2/2]
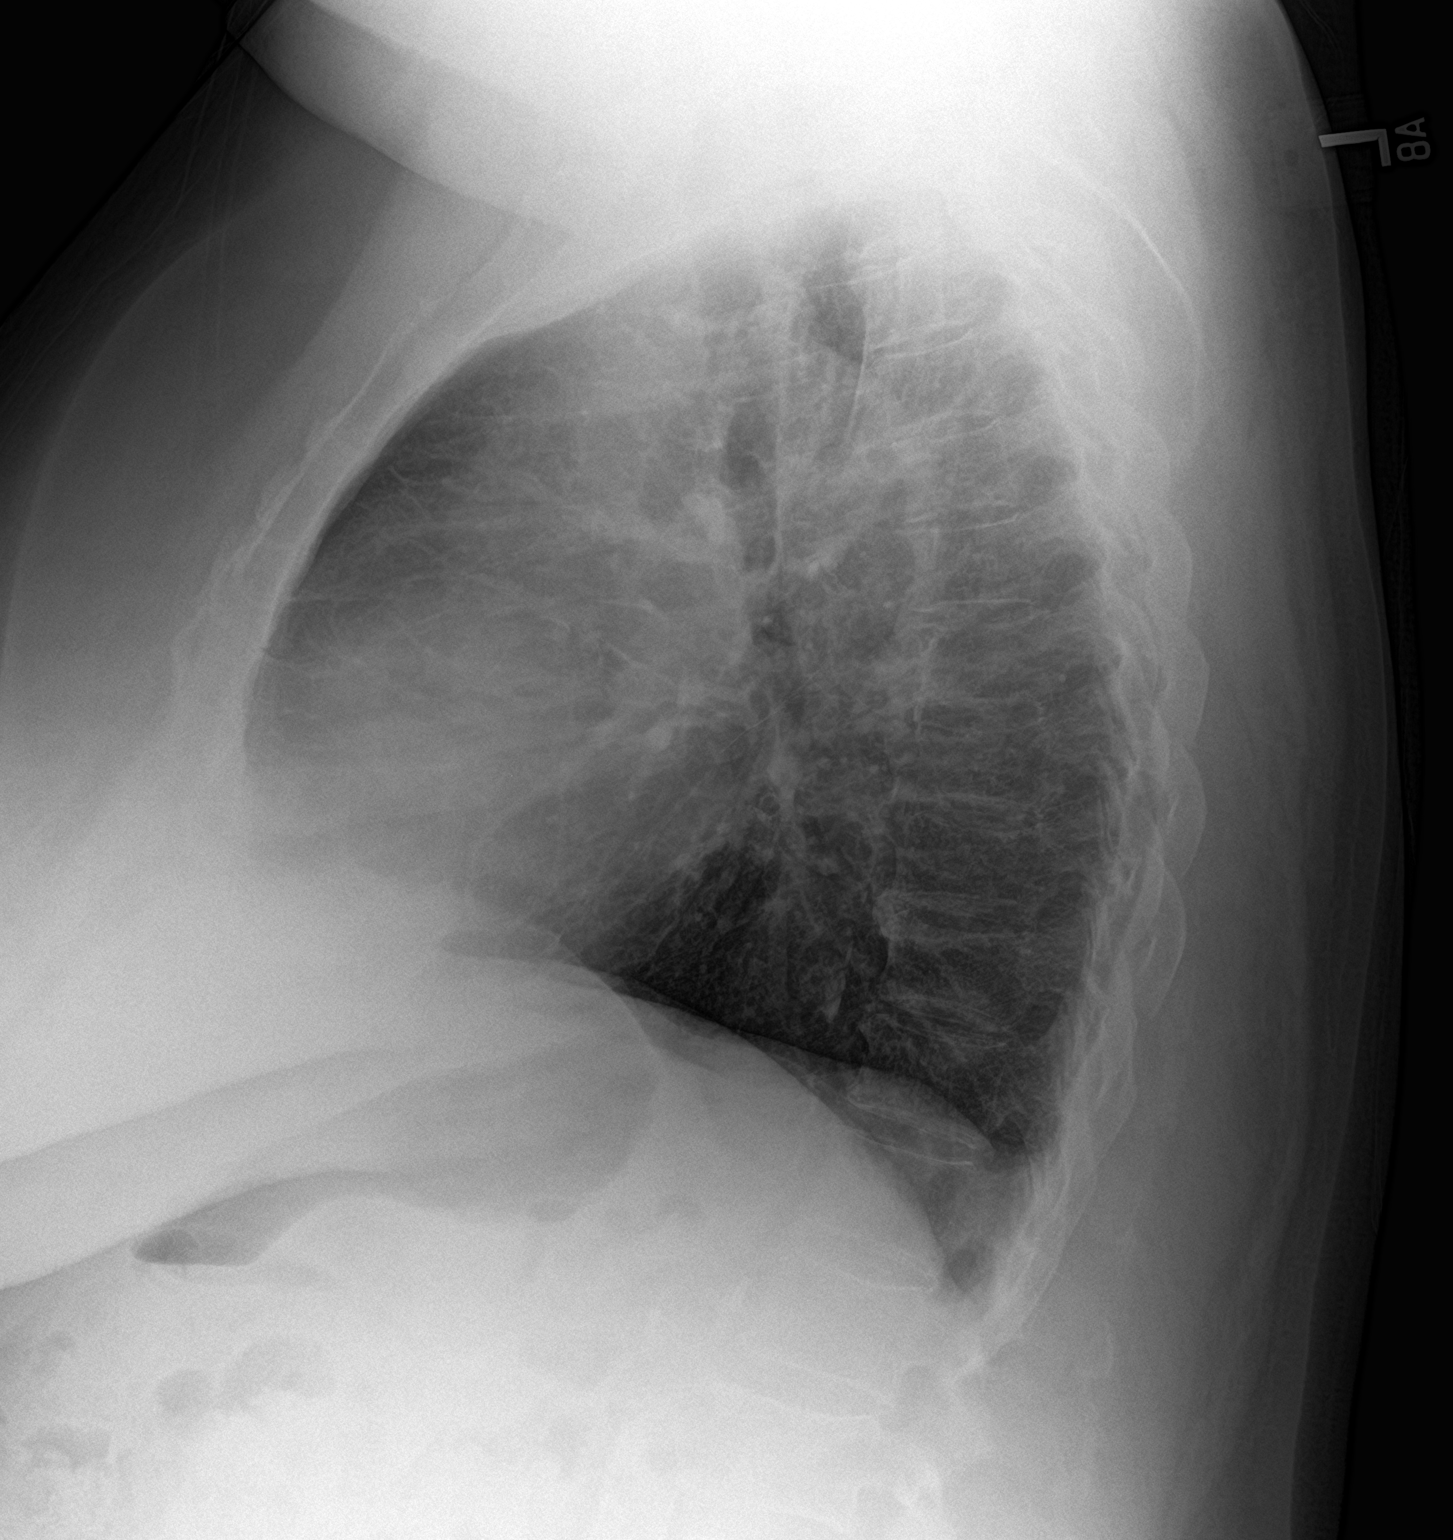

[2 of 2 positions shown; findings below may reference images not displayed]

FINDINGS: The lungs are well-expanded. There is no focal infiltrate. The
cardiac silhouette is enlarged. The pulmonary vascularity is mildly
prominent. There is no pleural effusion. There is calcification in
the wall of the thoracic aorta. The mediastinum is normal in width.
IMPRESSION: Findings compatible with low-grade compensated CHF. There is
probable underlying chronic bronchitis or reactive airway disease.
No acute pneumonia peer

## 2018-01-08 NOTE — Telephone Encounter (Signed)
Copied from [place] [id number]. Topic: General - Other >> Jan 08, 2018 10:04 AM Keene Breath wrote: Reason for CRM: Patient called to request a 90 day supply of insulin NPH Human (HUMULIN N,NOVOLIN N) 100 UNIT/ML injection.  Informed the patient that the office will follow up with the pharmacy to get the proper supply of medication.  Patient's CB# [phone].  Returned patients call and informed her that I have called the pharmacy ( [facility] [address]) and that they have ordered her medication ( Novolin N) and it will be in tomorrow. Patient verbalized understanding.

## 2018-01-09 NOTE — Telephone Encounter (Signed)
Patient had a noncontrasted CT scan of the neck  This supports the possible C7 nerve impingement and MRI was recommneded  I agree with this if he is an MRI candidate  With respect to the carotid disease I can't tell with a non contrast scan the degree of stenosis and an MRI would not show this either.

## 2018-01-09 NOTE — Telephone Encounter (Signed)
Called the patient back to let her know that Dr. Delana Meyer did review her MRI and he found that she has what appears to be a pinched nerve at her C7 in the spine. The patient wanted to know if this pain that she is feeling is effecting her carotid stenosis at all, according to Dr. Delana Meyer, no, the Carotid issue is something else separate in it's entirety. She is going to contact her Sophronia Simas doctor to see what the next step in care is going to be.

## 2018-01-09 NOTE — Telephone Encounter (Signed)
PATIENT CALLED INQUIRING IF DR SCHNIER HAS LOOKED AT HER MRI AND DOES HE AGREE WITH WHAT THE ORTHO DOCTOR IS SAYING. BASICALLY HAS HER CAROTID GOTTEN WORST AND IS IT THE REASON FOR HER SHOULDER PAIN. PLEASE ADVISE

## 2018-01-13 ENCOUNTER — Telehealth: Payer: Self-pay

## 2018-01-13 NOTE — Telephone Encounter (Signed)
Patient states she already scheduled an appointment with Dr.Solumn for this and she will call back after she sees her.

## 2018-01-13 NOTE — Telephone Encounter (Signed)
You can offer her an appointment on August 5 at 430.  Please confirm the appointment would be regarding the thyroid findings from her cervical spine CT.  Thanks.

## 2018-01-13 NOTE — Telephone Encounter (Signed)
Please advise where to schedule.

## 2018-01-13 NOTE — Telephone Encounter (Signed)
Copied from [place] [id number]. Topic: Appointment Scheduling - Scheduling Inquiry for Clinic >> Jan 13, 2018 10:33 AM Burchel, Abbi R wrote: Reason for CRM:   Pt needs appt to f/up from CT on 6/27.  Pt was instructed to f/up with PCP as soon as possible.   Pt: [phone]

## 2018-01-13 NOTE — Telephone Encounter (Signed)
Noted  

## 2018-01-14 ENCOUNTER — Other Ambulatory Visit: Payer: Self-pay

## 2018-01-14 DIAGNOSIS — Z794 Long term (current) use of insulin: Principal | ICD-10-CM

## 2018-01-14 DIAGNOSIS — N183 Chronic kidney disease, stage 3 unspecified: Secondary | ICD-10-CM

## 2018-01-14 DIAGNOSIS — E1165 Type 2 diabetes mellitus with hyperglycemia: Principal | ICD-10-CM

## 2018-01-14 DIAGNOSIS — E1122 Type 2 diabetes mellitus with diabetic chronic kidney disease: Secondary | ICD-10-CM

## 2018-01-14 DIAGNOSIS — IMO0002 Reserved for concepts with insufficient information to code with codable children: Secondary | ICD-10-CM

## 2018-01-14 MED ORDER — INSULIN REGULAR HUMAN 100 UNIT/ML IJ SOLN: INTRAMUSCULAR | 1 refills | Status: DC

## 2018-01-15 ENCOUNTER — Telehealth: Payer: Self-pay

## 2018-01-15 NOTE — Telephone Encounter (Signed)
[place] if ok with PCP

## 2018-01-15 NOTE — Telephone Encounter (Signed)
Fyi is this ok with you?

## 2018-01-15 NOTE — Telephone Encounter (Signed)
That is fine with me.

## 2018-01-15 NOTE — Telephone Encounter (Signed)
Copied from [place] [id number]. Topic: Appointment Scheduling - Scheduling Inquiry for Clinic >> Jan 15, 2018  1:58 PM Scherrie Gerlach wrote: Reason for CRM:  pt would like to switch to Dr Olivia Mackie from Dr Josephina Gip.  Pt states she prefers a woman dr, that all. Is that ok Dr Josephina Gip? Ok with you Dr Aundra Dubin?

## 2018-01-17 ENCOUNTER — Telehealth: Payer: Self-pay

## 2018-01-17 NOTE — Telephone Encounter (Signed)
Patients grand daughter states the patient has been experiencing weightloss. Patient reports a weight loss of 12 pounds since last appointment on 11/25/17. I have scheduled patient with DR. Mclean per recommendation from DR.Sonnenberg. Patient notified. Patient has previously requested to establish care with Dr.Mclean which was approved.

## 2018-01-20 ENCOUNTER — Ambulatory Visit (INDEPENDENT_AMBULATORY_CARE_PROVIDER_SITE_OTHER): Payer: Medicare Other

## 2018-01-20 ENCOUNTER — Encounter: Payer: Self-pay | Admitting: Internal Medicine

## 2018-01-20 ENCOUNTER — Telehealth: Payer: Self-pay | Admitting: Internal Medicine

## 2018-01-20 ENCOUNTER — Ambulatory Visit (INDEPENDENT_AMBULATORY_CARE_PROVIDER_SITE_OTHER): Payer: Medicare Other | Admitting: Internal Medicine

## 2018-01-20 VITALS — BP 144/62 | HR 72 | Temp 98.5°F | Ht 63.0 in | Wt 269.4 lb

## 2018-01-20 DIAGNOSIS — R0602 Shortness of breath: Secondary | ICD-10-CM | POA: Diagnosis not present

## 2018-01-20 DIAGNOSIS — I10 Essential (primary) hypertension: Secondary | ICD-10-CM | POA: Diagnosis not present

## 2018-01-20 DIAGNOSIS — J069 Acute upper respiratory infection, unspecified: Secondary | ICD-10-CM | POA: Diagnosis not present

## 2018-01-20 DIAGNOSIS — Z1159 Encounter for screening for other viral diseases: Secondary | ICD-10-CM

## 2018-01-20 DIAGNOSIS — E1165 Type 2 diabetes mellitus with hyperglycemia: Secondary | ICD-10-CM

## 2018-01-20 DIAGNOSIS — E041 Nontoxic single thyroid nodule: Secondary | ICD-10-CM | POA: Insufficient documentation

## 2018-01-20 DIAGNOSIS — R630 Anorexia: Secondary | ICD-10-CM

## 2018-01-20 DIAGNOSIS — Z0184 Encounter for antibody response examination: Secondary | ICD-10-CM

## 2018-01-20 DIAGNOSIS — R600 Localized edema: Secondary | ICD-10-CM

## 2018-01-20 DIAGNOSIS — H6123 Impacted cerumen, bilateral: Secondary | ICD-10-CM

## 2018-01-20 DIAGNOSIS — I5032 Chronic diastolic (congestive) heart failure: Secondary | ICD-10-CM | POA: Diagnosis not present

## 2018-01-20 DIAGNOSIS — R059 Cough, unspecified: Secondary | ICD-10-CM

## 2018-01-20 DIAGNOSIS — E559 Vitamin D deficiency, unspecified: Secondary | ICD-10-CM | POA: Diagnosis not present

## 2018-01-20 DIAGNOSIS — R05 Cough: Secondary | ICD-10-CM

## 2018-01-20 LAB — COMPREHENSIVE METABOLIC PANEL
ALBUMIN: 4.2 g/dL (ref 3.5–5.2)
ALK PHOS: 41 U/L (ref 39–117)
ALT: 20 U/L (ref 0–35)
AST: 23 U/L (ref 0–37)
BILIRUBIN TOTAL: 0.6 mg/dL (ref 0.2–1.2)
BUN: 20 mg/dL (ref 6–23)
CO2: 33 mEq/L — ABNORMAL HIGH (ref 19–32)
CREATININE: 1.33 mg/dL — AB (ref 0.40–1.20)
Calcium: 10.3 mg/dL (ref 8.4–10.5)
Chloride: 99 mEq/L (ref 96–112)
GFR: 49.49 mL/min — ABNORMAL LOW (ref >60.00)
GLUCOSE: 233 mg/dL — AB (ref 70–99)
POTASSIUM: 4.8 meq/L (ref 3.5–5.1)
SODIUM: 137 meq/L (ref 135–145)
TOTAL PROTEIN: 7.3 g/dL (ref 6.0–8.3)

## 2018-01-20 LAB — LIPID PANEL
CHOL/HDL RATIO: 3
CHOLESTEROL: 141 mg/dL (ref 0–200)
HDL: 44.2 mg/dL (ref >39.00)
LDL CALC: 66 mg/dL (ref 0–99)
NonHDL: 96.38
TRIGLYCERIDES: 152 mg/dL — AB (ref 0.0–149.0)
VLDL: 30.4 mg/dL (ref 0.0–40.0)

## 2018-01-20 LAB — MICROALBUMIN / CREATININE URINE RATIO
CREATININE, U: 45.1 mg/dL
Microalb Creat Ratio: 1.6 mg/g (ref 0.0–30.0)

## 2018-01-20 LAB — CBC WITH DIFFERENTIAL/PLATELET
Basophils Absolute: 0.2 10*3/uL — ABNORMAL HIGH (ref 0.0–0.1)
Basophils Relative: 2.1 % (ref 0.0–3.0)
EOS ABS: 0.6 10*3/uL (ref 0.0–0.7)
Eosinophils Relative: 7.3 % — ABNORMAL HIGH (ref 0.0–5.0)
HCT: 37.7 % (ref 36.0–46.0)
HEMOGLOBIN: 12.4 g/dL (ref 12.0–15.0)
LYMPHS ABS: 4.5 10*3/uL — AB (ref 0.7–4.0)
Lymphocytes Relative: 55.8 % — ABNORMAL HIGH (ref 12.0–46.0)
MCHC: 33 g/dL (ref 30.0–36.0)
MCV: 90.1 fl (ref 78.0–100.0)
MONO ABS: 0.5 10*3/uL (ref 0.1–1.0)
Monocytes Relative: 6.8 % (ref 3.0–12.0)
NEUTROS PCT: 28 % — AB (ref 43.0–77.0)
Neutro Abs: 2.2 10*3/uL (ref 1.4–7.7)
Platelets: 208 10*3/uL (ref 150.0–400.0)
RBC: 4.18 Mil/uL (ref 3.87–5.11)
RDW: 13.8 % (ref 11.5–15.5)
WBC: 8 10*3/uL (ref 4.0–10.5)

## 2018-01-20 LAB — T3, FREE: T3 FREE: 2.8 pg/mL (ref 2.3–4.2)

## 2018-01-20 LAB — VITAMIN D 25 HYDROXY (VIT D DEFICIENCY, FRACTURES): VITD: 43.23 ng/mL (ref 30.00–100.00)

## 2018-01-20 LAB — T4, FREE: Free T4: 0.85 ng/dL (ref 0.60–1.60)

## 2018-01-20 LAB — TSH: TSH: 1.86 u[IU]/mL (ref 0.35–4.50)

## 2018-01-20 MED ORDER — AZITHROMYCIN 250 MG PO TABS: ORAL_TABLET | ORAL | 0 refills | Status: DC

## 2018-01-20 MED ORDER — ALBUTEROL SULFATE (2.5 MG/3ML) 0.083% IN NEBU
2.5000 mg | INHALATION_SOLUTION | Freq: Once | RESPIRATORY_TRACT | Status: AC
  Administered 2018-01-20: 2.5 mg via RESPIRATORY_TRACT

## 2018-01-20 MED ORDER — BENZONATATE 200 MG PO CAPS: 200.0000 mg | ORAL_CAPSULE | Freq: Three times a day (TID) | ORAL | 0 refills | Status: DC | PRN

## 2018-01-20 MED ORDER — IPRATROPIUM BROMIDE 0.02 % IN SOLN
0.5000 mg | Freq: Once | RESPIRATORY_TRACT | Status: AC
  Administered 2018-01-20: 0.5 mg via RESPIRATORY_TRACT

## 2018-01-20 NOTE — Progress Notes (Signed)
No chief complaint on file.  Sick visit  1. C/o cough dry and wheezing since Monday noted after sister visiting turned thermostat down otherwise no sick contacts, Denies sinus pressure. C/o right ear pain and CP 2/2 coughing tried 2 bottles Robitussin and otc pill for cough w/o relief.  She felt like she had a fever as well  2. Overall c/o reduced po intake due to no appetite and also no teeth and dentures not fitting  3. 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 cbgs at home ni am fasting 180s-200s 4. C/o chronic leg edema but currently left swelling is improved L always>right.     Review of Systems  Constitutional: Negative for weight loss.  HENT: Negative for hearing loss.   Eyes: Negative for blurred vision.  Respiratory: Positive for cough and wheezing.   Cardiovascular: Positive for leg swelling. Negative for chest pain.  Gastrointestinal: Negative for abdominal pain.       +reduced appetite   Musculoskeletal: Negative for joint pain.  Skin: Negative for rash.  Psychiatric/Behavioral: Negative for depression.   Past Medical History:  Diagnosis Date  . Arthritis   . Chickenpox   . Diabetes mellitus without complication ([place])    one elevated reading/ no treatment  . Diverticulitis   . GI bleed   . High cholesterol   . History of blood transfusion   . Hypertension   . Renal insufficiency    Past Surgical History:  Procedure Laterality Date  . APPENDECTOMY    . ECTOPIC PREGNANCY SURGERY    . EYE SURGERY     bilateral cataracts  . gallbladder     . HYSTEROSCOPY W/D&C N/A 10/26/2016   Procedure: DILATATION AND CURETTAGE /HYSTEROSCOPY;  Surgeon: Benjaman Kindler, MD;  Location: [facility] ORS;  Service: Gynecology;  Laterality: N/A;  . THYROIDECTOMY, PARTIAL     Family History  Problem Relation Age of Onset  . Diabetes Mother   . Hypertension Mother   . Stroke Mother    . Diabetes Other   . Diabetes Sister   . Heart disease Sister    Social History   Socioeconomic History  . Marital status: Widowed    Spouse name: Not on file  . Number of children: Not on file  . Years of education: Not on file  . Highest education level: Not on file  Occupational History  . Not on file  Social Needs  . Financial resource strain: Not on file  . Food insecurity:    Worry: Not on file    Inability: Not on file  . Transportation needs:    Medical: Not on file    Non-medical: Not on file  Tobacco Use  . Smoking status: Never Smoker  . Smokeless tobacco: Never Used  Substance and Sexual Activity  . Alcohol use: No  . Drug use: No  . Sexual activity: Not on file  Lifestyle  . Physical activity:    Days per week: Not on file    Minutes per session: Not on file  . Stress: Not on file  Relationships  . Social connections:    Talks on phone: Not on file    Gets together: Not on file    Attends religious service: Not on file    Active member of club or organization: Not on file    Attends  meetings of clubs or organizations: Not on file    Relationship status: Not on file  . Intimate partner violence:    Fear of current or ex partner: Not on file    Emotionally abused: Not on file    Physically abused: Not on file    Forced sexual activity: Not on file  Other Topics Concern  . Not on file  Social History Narrative  . Not on file   No outpatient medications have been marked as taking for the 01/20/18 encounter (Office Visit) with McLean-Scocuzza, Nino Glow, MD.   Allergies  Allergen Reactions  . Penicillins Rash    Has patient had a PCN reaction causing immediate rash, facial/tongue/throat swelling, SOB or lightheadedness with hypotension: Yes Has patient had a PCN reaction causing severe rash involving mucus membranes or skin necrosis: No Has patient had a PCN reaction that required hospitalization No Has patient had a PCN reaction occurring within the  last 10 years: Yes If all of the above answers are "NO", then may proceed with Cephalosporin use.    Recent Results (from the past 2160 hour(s))  POCT HgB A1C     Status: Abnormal   Collection Time: 11/25/17 11:05 AM  Result Value Ref Range   Hemoglobin A1C 9.7 (A) 4.0 - 5.6 %   HbA1c, POC (prediabetic range)  5.7 - 6.4 %   HbA1c, POC (controlled diabetic range)  0.0 - 7.0 %   Objective  There is no height or weight on file to calculate BMI. Wt Readings from Last 3 Encounters:  11/25/17 267 lb 6.4 oz (121.3 kg)  08/23/17 266 lb 6.4 oz (120.8 kg)  08/13/17 263 lb 4 oz (119.4 kg)   Temp Readings from Last 3 Encounters:  11/25/17 99 F (37.2 C) (Oral)  08/23/17 98.1 F (36.7 C) (Oral)  04/17/17 98.3 F (36.8 C) (Oral)   BP Readings from Last 3 Encounters:  11/25/17 118/60  08/23/17 (!) 146/72  08/13/17 124/64   Pulse Readings from Last 3 Encounters:  11/25/17 70  08/23/17 86  08/13/17 75    Physical Exam  Constitutional: She is oriented to person, place, and time. Vital signs are normal. She appears well-developed and well-nourished. She is cooperative.  HENT:  Head: Normocephalic and atraumatic.  Mouth/Throat: Oropharynx is clear and moist and mucous membranes are normal.  Eyes: Pupils are equal, round, and reactive to light. Conjunctivae are normal.  Cardiovascular: Normal rate, regular rhythm and normal heart sounds.  1+ edema b/l legs   Pulmonary/Chest: Effort normal and breath sounds normal.  Neurological: She is alert and oriented to person, place, and time. Gait normal.  Skin: Skin is warm, dry and intact.  Psychiatric: She has a normal mood and affect. Her speech is normal and behavior is normal. Judgment and thought content normal. Cognition and memory are normal.  Nursing note and vitals reviewed.   Assessment   1. URI r/o pneumonia with cough and wheezing  2. Reduced po intake ? Etiology  3. DM 2 A1C 9.7 11/25/17  4. H/o chronic diastolic CHF  5.  HM 6. 2 cm left thyroid nodule  7. B/l cerumen impaction  Plan  1. duoneb x 1  Tessalon perles, zpack, otc meds delsym, robitussin or coricidan CXR today  2. Comprehensive labs today CMET, CBC, Urine protein, lipid, vitamin D, MMR, thyroid labs complete 2/2 left 2 cm thyroid nodule noted MRI C spine  3. Not taking humulin N 23 am and 33 pm taking 25 am  and 20 pm with novololin R 15 tid but will give SSI to do based on cbgs  Declines to take any other medications for diabetes of increase humulin N due to h/o hypoglycemia and coma Will need to get eye records in future and do foot exam   4. F/u with cardiology Dr. Rockey Situ  5.  Will need flu upcoming  Check on other vaccines (I.e Tdap, prevnar, pna 23, shingrix)  mammo had 08/17/17 normal [facility]  No pap 2/2 age  DEXA pt declines for now  Colonoscopy need to get report per pt had Dr. Allen Norris in [facility]  Consider check hep C CT chest never smoker   6. F/u [facility] endocrine Dr. Gabriel Carina appt pending complete tfts today  7. Removed with currette b/l ears today   Provider: Dr. Olivia Mackie McLean-Scocuzza-Internal Medicine

## 2018-01-20 NOTE — Patient Instructions (Addendum)
Use over the counter ear drops as needed for ear wax build up as needed for 4-7 days at at time Take Coricidan or Robitussin DM otc cough  And tessalon perles as needed   Please take Novolin R per sliding scale  Sugar 70-130 0 units  131-180 4 units  181-240 8 units  241-300 10 units  301-350 12 units  351-400 16 units  >400 20 units and call the doctor   F/u in 3 weeks sooner if needed  Cough, Adult Coughing is a reflex that clears your throat and your airways. Coughing helps to heal and protect your lungs. It is normal to cough occasionally, but a cough that happens with other symptoms or lasts a long time may be a sign of a condition that needs treatment. A cough may last only 2-3 weeks (acute), or it may last longer than 8 weeks (chronic). What are the causes? Coughing is commonly caused by:  Breathing in substances that irritate your lungs.  A viral or bacterial respiratory infection.  Allergies.  Asthma.  Postnasal drip.  Smoking.  Acid backing up from the stomach into the esophagus (gastroesophageal reflux).  Certain medicines.  Chronic lung problems, including COPD (or rarely, lung cancer).  Other medical conditions such as heart failure.  Follow these instructions at home: Pay attention to any changes in your symptoms. Take these actions to help with your discomfort:  Take medicines only as told by your health care provider. ? If you were prescribed an antibiotic medicine, take it as told by your health care provider. Do not stop taking the antibiotic even if you start to feel better. ? Talk with your health care provider before you take a cough suppressant medicine.  Drink enough fluid to keep your urine clear or pale yellow.  If the air is dry, use a cold steam vaporizer or humidifier in your bedroom or your home to help loosen secretions.  Avoid anything that causes you to cough at work or at home.  If your cough is worse at night, try sleeping in a  semi-upright position.  Avoid cigarette smoke. If you smoke, quit smoking. If you need help quitting, ask your health care provider.  Avoid caffeine.  Avoid alcohol.  Rest as needed.  Contact a health care provider if:  You have new symptoms.  You cough up pus.  Your cough does not get better after 2-3 weeks, or your cough gets worse.  You cannot control your cough with suppressant medicines and you are losing sleep.  You develop pain that is getting worse or pain that is not controlled with pain medicines.  You have a fever.  You have unexplained weight loss.  You have night sweats. Get help right away if:  You cough up blood.  You have difficulty breathing.  Your heartbeat is very fast. This information is not intended to replace advice given to you by your health care provider. Make sure you discuss any questions you have with your health care provider. Document Released: 12/01/2010 Document Revised: 11/10/2015 Document Reviewed: 08/11/2014 Elsevier Interactive Patient Education  2018 [place].    Debrox/Carbamide Peroxide ear solution What is this medicine? CARBAMIDE PEROXIDE (CAR bah mide per OX ide) is used to soften and help remove ear wax. This medicine may be used for other purposes; ask your health care provider or pharmacist if you have questions. COMMON BRAND NAME(S): Auro Ear, Auro Earache Relief, Debrox, Ear Drops, Ear Wax Removal, Ear Wax Remover, Earwax Treatment,  Murine, Thera-Ear What should I tell my health care provider before I take this medicine? They need to know if you have any of these conditions: -dizziness -ear discharge -ear pain, irritation or rash -infection -perforated eardrum (hole in eardrum) -an unusual or allergic reaction to carbamide peroxide, glycerin, hydrogen peroxide, other medicines, foods, dyes, or preservatives -pregnant or trying to get pregnant -breast-feeding How should I use this medicine? This medicine is  only for use in the outer ear canal. Follow the directions carefully. Wash hands before and after use. The solution may be warmed by holding the bottle in the hand for 1 to 2 minutes. Lie with the affected ear facing upward. Place the proper number of drops into the ear canal. After the drops are instilled, remain lying with the affected ear upward for 5 minutes to help the drops stay in the ear canal. A cotton ball may be gently inserted at the ear opening for no longer than 5 to 10 minutes to ensure retention. Repeat, if necessary, for the opposite ear. Do not touch the tip of the dropper to the ear, fingertips, or other surface. Do not rinse the dropper after use. Keep container tightly closed. Talk to your pediatrician regarding the use of this medicine in children. While this drug may be used in children as young as 12 years for selected conditions, precautions do apply. Overdosage: If you think you have taken too much of this medicine contact a poison control center or emergency room at once. NOTE: This medicine is only for you. Do not share this medicine with others. What if I miss a dose? If you miss a dose, use it as soon as you can. If it is almost time for your next dose, use only that dose. Do not use double or extra doses. What may interact with this medicine? Interactions are not expected. Do not use any other ear products without asking your doctor or health care professional. This list may not describe all possible interactions. Give your health care provider a list of all the medicines, herbs, non-prescription drugs, or dietary supplements you use. Also tell them if you smoke, drink alcohol, or use illegal drugs. Some items may interact with your medicine. What should I watch for while using this medicine? This medicine is not for long-term use. Do not use for more than 4 days without checking with your health care professional. Contact your doctor or health care professional if your  condition does not start to get better within a few days or if you notice burning, redness, itching or swelling. What side effects may I notice from receiving this medicine? Side effects that you should report to your doctor or health care professional as soon as possible: -allergic reactions like skin rash, itching or hives, swelling of the face, lips, or tongue -burning, itching, and redness -worsening ear pain -rash Side effects that usually do not require medical attention (report to your doctor or health care professional if they continue or are bothersome): -abnormal sensation while putting the drops in the ear -temporary reduction in hearing (but not complete loss of hearing) This list may not describe all possible side effects. Call your doctor for medical advice about side effects. You may report side effects to FDA at 1-800-FDA-1088. Where should I keep my medicine? Keep out of the reach of children. Store at room temperature between 15 and 30 degrees C (59 and 86 degrees F) in a tight, light-resistant container. Keep bottle away from excessive heat  and direct sunlight. Throw away any unused medicine after the expiration date. NOTE: This sheet is a summary. It may not cover all possible information. If you have questions about this medicine, talk to your doctor, pharmacist, or health care provider.  2018 Elsevier/Gold Standard (2007-09-16 14:00:02)

## 2018-01-20 NOTE — Progress Notes (Signed)
Pre visit review using our clinic review tool, if applicable. No additional management support is needed unless otherwise documented below in the visit note. 

## 2018-01-20 NOTE — Telephone Encounter (Signed)
Call pharmacy and get list of medications faxed to me please   Thanks [place]

## 2018-01-21 LAB — MEASLES/MUMPS/RUBELLA IMMUNITY
MUMPS IGG: 54.7 [AU]/ml
Rubella: >33 index

## 2018-01-22 NOTE — Telephone Encounter (Signed)
Pharmacy stated they will fax medication list over.

## 2018-01-23 ENCOUNTER — Telehealth: Payer: Self-pay | Admitting: Cardiovascular Disease

## 2018-01-23 DIAGNOSIS — I5032 Chronic diastolic (congestive) heart failure: Secondary | ICD-10-CM

## 2018-01-23 DIAGNOSIS — I10 Essential (primary) hypertension: Secondary | ICD-10-CM

## 2018-01-23 NOTE — Telephone Encounter (Signed)
Pt is returning your call , she requests to call after 12:30

## 2018-01-23 NOTE — Telephone Encounter (Signed)
Patient returning our call ° °Please call back ° °

## 2018-01-23 NOTE — Telephone Encounter (Signed)
Patient calling back. We discussed her lab results that her PCP had already resulted and commented on with plan of care. Encouraged patient to proceed with PCP's advice. Patient was unaware she has chronic CHF which can lead to some LVH which Dr Rockey Situ has documented. We discussed this and she verbalized understanding. I advised patient that her situation was stable at this time and since the PCP was the order physician they would advise. She was appreciative.

## 2018-01-23 NOTE — Telephone Encounter (Signed)
Pt states she has had a chest xray and labwork performed by her PCP and would like Dr. Rockey Situ would review it. Please call to discuss.

## 2018-01-23 NOTE — Telephone Encounter (Signed)
No answer. Left message to call back if needed; otherwise we are waiting for Dr Rockey Situ to review.

## 2018-01-23 NOTE — Telephone Encounter (Signed)
No answer. Left message to call back and that I'm routing to Dr Rockey Situ to review.

## 2018-01-26 ENCOUNTER — Telehealth: Payer: Self-pay | Admitting: Internal Medicine

## 2018-01-26 NOTE — Telephone Encounter (Signed)
Fax my 01/20/18 note to Dr. Gabriel Carina to see if she will comment on how to better manage dm 2 uncontrolled A1C 9.7 given A/P from note this day   Would like fax back with recommendations   Thanks [place]

## 2018-01-27 NOTE — Telephone Encounter (Signed)
Note has been faxed electronically 

## 2018-01-31 ENCOUNTER — Ambulatory Visit: Payer: Self-pay | Admitting: Family Medicine

## 2018-02-04 ENCOUNTER — Ambulatory Visit: Payer: Self-pay | Admitting: Family Medicine

## 2018-02-05 ENCOUNTER — Ambulatory Visit: Payer: Self-pay | Admitting: Family Medicine

## 2018-02-12 ENCOUNTER — Other Ambulatory Visit: Payer: Self-pay | Admitting: Family Medicine

## 2018-02-12 ENCOUNTER — Ambulatory Visit (INDEPENDENT_AMBULATORY_CARE_PROVIDER_SITE_OTHER): Payer: Medicare Other | Admitting: Internal Medicine

## 2018-02-12 ENCOUNTER — Encounter: Payer: Self-pay | Admitting: Internal Medicine

## 2018-02-12 VITALS — BP 160/78 | HR 77 | Temp 99.2°F | Ht 63.0 in | Wt 267.8 lb

## 2018-02-12 DIAGNOSIS — E041 Nontoxic single thyroid nodule: Secondary | ICD-10-CM

## 2018-02-12 DIAGNOSIS — I10 Essential (primary) hypertension: Secondary | ICD-10-CM

## 2018-02-12 DIAGNOSIS — N183 Chronic kidney disease, stage 3 unspecified: Secondary | ICD-10-CM

## 2018-02-12 DIAGNOSIS — J329 Chronic sinusitis, unspecified: Secondary | ICD-10-CM

## 2018-02-12 DIAGNOSIS — E1165 Type 2 diabetes mellitus with hyperglycemia: Secondary | ICD-10-CM | POA: Diagnosis not present

## 2018-02-12 DIAGNOSIS — J069 Acute upper respiratory infection, unspecified: Secondary | ICD-10-CM

## 2018-02-12 MED ORDER — DOXYCYCLINE HYCLATE 100 MG PO TABS: 100.0000 mg | ORAL_TABLET | Freq: Two times a day (BID) | ORAL | 0 refills | Status: DC

## 2018-02-12 NOTE — Progress Notes (Addendum)
Chief Complaint  Patient presents with  . Follow-up   F/u  1. URI/sinusiitis tried Zpack w/o help and other meds since last visit castor oil helped pt. Cough improved  2. HTN uncontrolled despite taking meds today clonidine 0.2 bid,  benicar 40, aldactone 25 mg qd, torsemide 20 mg bid 3. Thyroid nodules had bx 02/03/18 need to get report   Review of Systems  Constitutional: Negative for weight loss.  HENT: Negative for hearing loss.   Eyes: Negative for blurred vision.  Respiratory: Positive for cough.   Cardiovascular: Negative for chest pain.  Gastrointestinal: Negative for abdominal pain.  Musculoskeletal: Negative for falls.  Skin: Negative for rash.  Neurological: Negative for headaches.  Psychiatric/Behavioral: Negative for depression.   Past Medical History:  Diagnosis Date  . Arthritis   . Chickenpox   . Diabetes mellitus without complication ([place])    one elevated reading/ no treatment  . Diverticulitis   . GI bleed   . High cholesterol   . History of blood transfusion   . Hypertension   . Renal insufficiency    Past Surgical History:  Procedure Laterality Date  . APPENDECTOMY    . ECTOPIC PREGNANCY SURGERY    . EYE SURGERY     bilateral cataracts  . gallbladder     . HYSTEROSCOPY W/D&C N/A 10/26/2016   Procedure: DILATATION AND CURETTAGE /HYSTEROSCOPY;  Surgeon: Benjaman Kindler, MD;  Location: [facility] ORS;  Service: Gynecology;  Laterality: N/A;  . THYROIDECTOMY, PARTIAL     Family History  Problem Relation Age of Onset  . Diabetes Mother   . Hypertension Mother   . Stroke Mother   . Diabetes Other   . Diabetes Sister   . Heart disease Sister    Social History   Socioeconomic History  . Marital status: Widowed    Spouse name: Not on file  . Number of children: Not on file  . Years of education: Not on file  . Highest education level: Not on file  Occupational History  . Not on file  Social Needs  . Financial resource strain: Not on file  . Food  insecurity:    Worry: Not on file    Inability: Not on file  . Transportation needs:    Medical: Not on file    Non-medical: Not on file  Tobacco Use  . Smoking status: Never Smoker  . Smokeless tobacco: Never Used  Substance and Sexual Activity  . Alcohol use: No  . Drug use: No  . Sexual activity: Not on file  Lifestyle  . Physical activity:    Days per week: Not on file    Minutes per session: Not on file  . Stress: Not on file  Relationships  . Social connections:    Talks on phone: Not on file    Gets together: Not on file    Attends religious service: Not on file    Active member of club or organization: Not on file    Attends meetings of clubs or organizations: Not on file    Relationship status: Not on file  . Intimate partner violence:    Fear of current or ex partner: Not on file    Emotionally abused: Not on file    Physically abused: Not on file    Forced sexual activity: Not on file  Other Topics Concern  . Not on file  Social History Narrative  . Not on file   Current Meds  Medication Sig  . B-D  INS SYRINGE 0.5CC/30GX1/2" 30G X 1/2" 0.5 ML MISC USE AS DIRECTED  . blood glucose meter kit and supplies KIT Accu chek, Dx code E11.65, check 3 times daily  . cloNIDine (CATAPRES) 0.2 MG tablet TAKE 1 TABLET (0.2 MG TOTAL) BY MOUTH 2 (TWO) TIMES DAILY. (Patient taking differently: Take 0.2 mg by mouth 3 (three) times daily. )  . glucose blood ([employer] BLOOD GLUCOSE TEST) test strip USE TO CHECK CBG'S 3 TIMES DAILY. (Patient taking differently: USE TO CHECK CBG'S 3 TIMES DAILY. Guid test strips)  . insulin NPH Human (HUMULIN N,NOVOLIN N) 100 UNIT/ML injection Inject 0.15-0.25 mLs (15-25 Units total) into the skin 2 (two) times daily before a meal. Take 25 units in the morning and take 15-20 units in the evening. (Patient taking differently: Inject 15-25 Units into the skin 2 (two) times daily before a meal. Take 25 units in the morning and take 15-20 units in the  evening. 5 Vials for 90days supply)  . insulin regular (NOVOLIN R) 100 units/mL injection INJECT 15 UNITS TOTAL INTO THE SKIN 3 (THREE) TIMES DAILY BEFORE MEALS. (Patient taking differently: INJECT 15 UNITS TOTAL INTO THE SKIN 3 (THREE) TIMES DAILY BEFORE MEALS. 4 Vials for 90 day supply)  . Multiple Vitamin (MULTIVITAMIN WITH MINERALS) TABS tablet Take 1 tablet by mouth daily.  Marland Kitchen olmesartan (BENICAR) 40 MG tablet Take 1 tablet (40 mg total) by mouth daily.  . rosuvastatin (CRESTOR) 10 MG tablet TAKE 1 TABLET (10 MG TOTAL) BY MOUTH DAILY.  Marland Kitchen spironolactone (ALDACTONE) 25 MG tablet Take 1 tablet (25 mg total) by mouth daily.  . Syringe/Needle, Disp, (B-D ECLIPSE SYRINGE) 30G X 1/2" 1 ML MISC Use as directed with insulin DX:E11.65  . torsemide (DEMADEX) 20 MG tablet Take 1 tablet (20 mg total) by mouth 2 (two) times daily.   Allergies  Allergen Reactions  . Penicillins Rash    Has patient had a PCN reaction causing immediate rash, facial/tongue/throat swelling, SOB or lightheadedness with hypotension: Yes Has patient had a PCN reaction causing severe rash involving mucus membranes or skin necrosis: No Has patient had a PCN reaction that required hospitalization No Has patient had a PCN reaction occurring within the last 10 years: Yes If all of the above answers are "NO", then may proceed with Cephalosporin use.    Recent Results (from the past 2160 hour(s))  POCT HgB A1C     Status: Abnormal   Collection Time: 11/25/17 11:05 AM  Result Value Ref Range   Hemoglobin A1C 9.7 (A) 4.0 - 5.6 %   HbA1c, POC (prediabetic range)  5.7 - 6.4 %   HbA1c, POC (controlled diabetic range)  0.0 - 7.0 %  Comprehensive metabolic panel     Status: Abnormal   Collection Time: 01/20/18 11:05 AM  Result Value Ref Range   Sodium 137 135 - 145 mEq/L   Potassium 4.8 3.5 - 5.1 mEq/L   Chloride 99 96 - 112 mEq/L   CO2 33 (H) 19 - 32 mEq/L   Glucose, Bld 233 (H) 70 - 99 mg/dL   BUN 20 6 - 23 mg/dL   Creatinine,  Ser 1.33 (H) 0.40 - 1.20 mg/dL   Total Bilirubin 0.6 0.2 - 1.2 mg/dL   Alkaline Phosphatase 41 39 - 117 U/L   AST 23 0 - 37 U/L   ALT 20 0 - 35 U/L   Total Protein 7.3 6.0 - 8.3 g/dL   Albumin 4.2 3.5 - 5.2 g/dL   Calcium 10.3 8.4 -  10.5 mg/dL   GFR 49.49 (L) >60.00 mL/min  CBC with Differential/Platelet     Status: Abnormal   Collection Time: 01/20/18 11:05 AM  Result Value Ref Range   WBC 8.0 4.0 - 10.5 K/uL    Comment: Manual smear review agrees with instrument differential.   RBC 4.18 3.87 - 5.11 Mil/uL   Hemoglobin 12.4 12.0 - 15.0 g/dL   HCT 37.7 36.0 - 46.0 %   MCV 90.1 78.0 - 100.0 fl   MCHC 33.0 30.0 - 36.0 g/dL   RDW 13.8 11.5 - 15.5 %   Platelets 208.0 150.0 - 400.0 K/uL   Neutrophils Relative % 28.0 (L) 43.0 - 77.0 %   Lymphocytes Relative 55.8 Repeated and verified X2. (H) 12.0 - 46.0 %   Monocytes Relative 6.8 3.0 - 12.0 %   Eosinophils Relative 7.3 (H) 0.0 - 5.0 %   Basophils Relative 2.1 0.0 - 3.0 %   Neutro Abs 2.2 1.4 - 7.7 K/uL   Lymphs Abs 4.5 (H) 0.7 - 4.0 K/uL   Monocytes Absolute 0.5 0.1 - 1.0 K/uL   Eosinophils Absolute 0.6 0.0 - 0.7 K/uL   Basophils Absolute 0.2 (H) 0.0 - 0.1 K/uL  Urine Microalbumin w/creat. ratio     Status: None   Collection Time: 01/20/18 11:05 AM  Result Value Ref Range   Microalb, Ur <0.7 0.0 - 1.9 mg/dL   Creatinine,U 45.1 mg/dL   Microalb Creat Ratio 1.6 0.0 - 30.0 mg/g  Lipid panel     Status: Abnormal   Collection Time: 01/20/18 11:05 AM  Result Value Ref Range   Cholesterol 141 0 - 200 mg/dL    Comment: ATP III Classification       Desirable:  < 200 mg/dL               Borderline High:  200 - 239 mg/dL          High:  > = 240 mg/dL   Triglycerides 152.0 (H) 0.0 - 149.0 mg/dL    Comment: Normal:  <150 mg/dLBorderline High:  150 - 199 mg/dL   HDL 44.20 >39.00 mg/dL   VLDL 30.4 0.0 - 40.0 mg/dL   LDL Cholesterol 66 0 - 99 mg/dL   Total CHOL/HDL Ratio 3     Comment:                Men          Women1/2 Average Risk      3.4          3.3Average Risk          5.0          4.42X Average Risk          9.6          7.13X Average Risk          15.0          11.0                       NonHDL 96.38     Comment: NOTE:  Non-HDL goal should be 30 mg/dL higher than patient's LDL goal (i.e. LDL goal of < 70 mg/dL, would have non-HDL goal of < 100 mg/dL)  Measles/Mumps/Rubella Immunity     Status: None   Collection Time: 01/20/18 11:05 AM  Result Value Ref Range   Rubeola IgG >300.00 AU/mL    Comment: AU/mL  Interpretation -----            -------------- <25.00           Negative 25.00-29.99      Equivocal >29.99           Positive . A positive result indicates that the patient has antibody to measles virus. It does not differentiate  between an active or past infection. The clinical  diagnosis must be interpreted in conjunction with  clinical signs and symptoms of the patient.    Mumps IgG 54.70 AU/mL    Comment:  AU/mL           Interpretation -------         ---------------- <9.00             Negative 9.00-10.99        Equivocal >10.99            Positive A positive result indicates that the patient has  antibody to mumps virus. It does not differentiate between an  active or past infection. The clinical diagnosis must be interpreted in conjunction with clinical signs and symptoms of the patient. .    Rubella >33.00 index    Comment:     Index            Interpretation     -----            --------------       <0.90            Not consistent with Immunity     0.90-0.99        Equivocal     > or = 1.00      Consistent with Immunity  . The presence of rubella IgG antibody suggests  immunization or past or current infection with rubella virus.   Vitamin D (25 hydroxy)     Status: None   Collection Time: 01/20/18 11:05 AM  Result Value Ref Range   VITD 43.23 30.00 - 100.00 ng/mL  TSH     Status: None   Collection Time: 01/20/18 11:05 AM  Result Value Ref Range   TSH 1.86 0.35 - 4.50  uIU/mL  T4, free     Status: None   Collection Time: 01/20/18 11:05 AM  Result Value Ref Range   Free T4 0.85 0.60 - 1.60 ng/dL    Comment: Specimens from patients who are undergoing biotin therapy and /or ingesting biotin supplements may contain high levels of biotin.  The higher biotin concentration in these specimens interferes with this Free T4 assay.  Specimens that contain high levels  of biotin may cause false high results for this Free T4 assay.  Please interpret results in light of the total clinical presentation of the patient.    T3, free     Status: None   Collection Time: 01/20/18 11:05 AM  Result Value Ref Range   T3, Free 2.8 2.3 - 4.2 pg/mL   Objective  Body mass index is 47.44 kg/m. Wt Readings from Last 3 Encounters:  02/12/18 267 lb 12.8 oz (121.5 kg)  01/20/18 269 lb 6.4 oz (122.2 kg)  11/25/17 267 lb 6.4 oz (121.3 kg)   Temp Readings from Last 3 Encounters:  02/12/18 99.2 F (37.3 C) (Oral)  01/20/18 98.5 F (36.9 C) (Oral)  11/25/17 99 F (37.2 C) (Oral)   BP Readings from Last 3 Encounters:  02/12/18 (!) 160/78  01/20/18 (!) 144/62  11/25/17 118/60   Pulse Readings from Last 3  Encounters:  02/12/18 77  01/20/18 72  11/25/17 70    Physical Exam  Constitutional: She is oriented to person, place, and time. Vital signs are normal. She appears well-developed and well-nourished. She is cooperative.  HENT:  Head: Normocephalic and atraumatic.  Nose: Nose normal.  Mouth/Throat: Oropharynx is clear and moist and mucous membranes are normal.  Eyes: Pupils are equal, round, and reactive to light. Conjunctivae are normal.  Cardiovascular: Normal rate, regular rhythm and normal heart sounds.  Pulmonary/Chest: Effort normal and breath sounds normal.  Neurological: She is alert and oriented to person, place, and time. Gait normal.  Skin: Skin is warm, dry and intact.  Psychiatric: She has a normal mood and affect. Her speech is normal and behavior is normal.  Judgment and thought content normal. Cognition and memory are normal.  Nursing note and vitals reviewed.   Assessment   1. HTN elevated today 2. DM 2 uncontrolled  3. URI/sinusitis 4. Thyroid nodules  -thyroid US 01/23/18 s/p removal right thyriod, left 2 nodules consider bx need to get report reviewed notes thyroid bx benign bx 02/03/18 nodules   5. HM Plan   1. Cont meds  Recheck BP  2. A1C 9.7 11/25/17  Check labs at f/u  Cont meds see last note for dosing  3. Doxycycline 100 mg bid x 1 week  Declines nose sprays and antihistamines  4. Get bx report Dr. Gabriel Carina 02/03/18 per pt normal  5.  Will need flu upcoming  Check on other vaccines (I.e Tdap, prevnar, pna 23, shingrix)  mammo had 08/17/17 normal [facility]  No pap 2/2 age  DEXA pt declines for now  Colonoscopy need to get report per pt had Dr. 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 Candiss Norse   Provider: Dr. Olivia Mackie [facility]

## 2018-02-12 NOTE — Progress Notes (Signed)
Pre visit review using our clinic review tool, if applicable. No additional management support is needed unless otherwise documented below in the visit note. 

## 2018-02-12 NOTE — Patient Instructions (Addendum)
Try Doxycycline 100 mg 2x per day with food x 1 week  F/u in 4-6 weeks sooner if needed   Diabetes Mellitus and Nutrition When you have diabetes (diabetes mellitus), it is very important to have healthy eating habits because your blood sugar (glucose) levels are greatly affected by what you eat and drink. Eating healthy foods in the appropriate amounts, at about the same times every day, can help you:  Control your blood glucose.  Lower your risk of heart disease.  Improve your blood pressure.  Reach or maintain a healthy weight.  Every person with diabetes is different, and each person has different needs for a meal plan. Your health care provider may recommend that you work with a diet and nutrition specialist (dietitian) to make a meal plan that is best for you. Your meal plan may vary depending on factors such as:  The calories you need.  The medicines you take.  Your weight.  Your blood glucose, blood pressure, and cholesterol levels.  Your activity level.  Other health conditions you have, such as heart or kidney disease.  How do carbohydrates affect me? Carbohydrates affect your blood glucose level more than any other type of food. Eating carbohydrates naturally increases the amount of glucose in your blood. Carbohydrate counting is a method for keeping track of how many carbohydrates you eat. Counting carbohydrates is important to keep your blood glucose at a healthy level, especially if you use insulin or take certain oral diabetes medicines. It is important to know how many carbohydrates you can safely have in each meal. This is different for every person. Your dietitian can help you calculate how many carbohydrates you should have at each meal and for snack. Foods that contain carbohydrates include:  Bread, cereal, rice, pasta, and crackers.  Potatoes and corn.  Peas, beans, and lentils.  Milk and yogurt.  Fruit and juice.  Desserts, such as cakes, cookies, ice  cream, and candy.  How does alcohol affect me? Alcohol can cause a sudden decrease in blood glucose (hypoglycemia), especially if you use insulin or take certain oral diabetes medicines. Hypoglycemia can be a life-threatening condition. Symptoms of hypoglycemia (sleepiness, dizziness, and confusion) are similar to symptoms of having too much alcohol. If your health care provider says that alcohol is safe for you, follow these guidelines:  Limit alcohol intake to no more than 1 drink per day for nonpregnant women and 2 drinks per day for men. One drink equals 12 oz of beer, 5 oz of wine, or 1 oz of hard liquor.  Do not drink on an empty stomach.  Keep yourself hydrated with water, diet soda, or unsweetened iced tea.  Keep in mind that regular soda, juice, and other mixers may contain a lot of sugar and must be counted as carbohydrates.  What are tips for following this plan? Reading food labels  Start by checking the serving size on the label. The amount of calories, carbohydrates, fats, and other nutrients listed on the label are based on one serving of the food. Many foods contain more than one serving per package.  Check the total grams (g) of carbohydrates in one serving. You can calculate the number of servings of carbohydrates in one serving by dividing the total carbohydrates by 15. For example, if a food has 30 g of total carbohydrates, it would be equal to 2 servings of carbohydrates.  Check the number of grams (g) of saturated and trans fats in one serving. Choose foods that  have low or no amount of these fats.  Check the number of milligrams (mg) of sodium in one serving. Most people should limit total sodium intake to less than 2,300 mg per day.  Always check the nutrition information of foods labeled as "low-fat" or "nonfat". These foods may be higher in added sugar or refined carbohydrates and should be avoided.  Talk to your dietitian to identify your daily goals for  nutrients listed on the label. Shopping  Avoid buying canned, premade, or processed foods. These foods tend to be high in fat, sodium, and added sugar.  Shop around the outside edge of the grocery store. This includes fresh fruits and vegetables, bulk grains, fresh meats, and fresh dairy. Cooking  Use low-heat cooking methods, such as baking, instead of high-heat cooking methods like deep frying.  Cook using healthy oils, such as olive, canola, or sunflower oil.  Avoid cooking with butter, cream, or high-fat meats. Meal planning  Eat meals and snacks regularly, preferably at the same times every day. Avoid going long periods of time without eating.  Eat foods high in fiber, such as fresh fruits, vegetables, beans, and whole grains. Talk to your dietitian about how many servings of carbohydrates you can eat at each meal.  Eat 4-6 ounces of lean protein each day, such as lean meat, chicken, fish, eggs, or tofu. 1 ounce is equal to 1 ounce of meat, chicken, or fish, 1 egg, or 1/4 cup of tofu.  Eat some foods each day that contain healthy fats, such as avocado, nuts, seeds, and fish. Lifestyle   Check your blood glucose regularly.  Exercise at least 30 minutes 5 or more days each week, or as told by your health care provider.  Take medicines as told by your health care provider.  Do not use any products that contain nicotine or tobacco, such as cigarettes and e-cigarettes. If you need help quitting, ask your health care provider.  Work with a Social worker or diabetes educator to identify strategies to manage stress and any emotional and social challenges. What are some questions to ask my health care provider?  Do I need to meet with a diabetes educator?  Do I need to meet with a dietitian?  What number can I call if I have questions?  When are the best times to check my blood glucose? Where to find more information:  American Diabetes Association:  diabetes.org/food-and-fitness/food  Academy of Nutrition and Dietetics: PokerClues.dk  [employer] of Diabetes and Digestive and Kidney Diseases (NIH): ContactWire.be Summary  A healthy meal plan will help you control your blood glucose and maintain a healthy lifestyle.  Working with a diet and nutrition specialist (dietitian) can help you make a meal plan that is best for you.  Keep in mind that carbohydrates and alcohol have immediate effects on your blood glucose levels. It is important to count carbohydrates and to use alcohol carefully. This information is not intended to replace advice given to you by your health care provider. Make sure you discuss any questions you have with your health care provider. Document Released: 03/01/2005 Document Revised: 07/09/2016 Document Reviewed: 07/09/2016 Elsevier Interactive Patient Education  2018 [place].   Upper Respiratory Infection, Adult Most upper respiratory infections (URIs) are caused by a virus. A URI affects the nose, throat, and upper air passages. The most common type of URI is often called "the common cold." Follow these instructions at home:  Take medicines only as told by your doctor.  Gargle warm  saltwater or take cough drops to comfort your throat as told by your doctor.  Use a warm mist humidifier or inhale steam from a shower to increase air moisture. This may make it easier to breathe.  Drink enough fluid to keep your pee (urine) clear or pale yellow.  Eat soups and other clear broths.  Have a healthy diet.  Rest as needed.  Go back to work when your fever is gone or your doctor says it is okay. ? You may need to stay home longer to avoid giving your URI to others. ? You can also wear a face mask and wash your hands often to prevent spread of the virus.  Use your inhaler more if  you have asthma.  Do not use any tobacco products, including cigarettes, chewing tobacco, or electronic cigarettes. If you need help quitting, ask your doctor. Contact a doctor if:  You are getting worse, not better.  Your symptoms are not helped by medicine.  You have chills.  You are getting more short of breath.  You have brown or red mucus.  You have yellow or brown discharge from your nose.  You have pain in your face, especially when you bend forward.  You have a fever.  You have puffy (swollen) neck glands.  You have pain while swallowing.  You have white areas in the back of your throat. Get help right away if:  You have very bad or constant: ? Headache. ? Ear pain. ? Pain in your forehead, behind your eyes, and over your cheekbones (sinus pain). ? Chest pain.  You have long-lasting (chronic) lung disease and any of the following: ? Wheezing. ? Long-lasting cough. ? Coughing up blood. ? A change in your usual mucus.  You have a stiff neck.  You have changes in your: ? Vision. ? Hearing. ? Thinking. ? Mood. This information is not intended to replace advice given to you by your health care provider. Make sure you discuss any questions you have with your health care provider. Document Released: 11/21/2007 Document Revised: 02/05/2016 Document Reviewed: 09/09/2013 Elsevier Interactive Patient Education  2018 [employer].

## 2018-02-20 ENCOUNTER — Encounter: Payer: Self-pay | Admitting: Internal Medicine

## 2018-03-07 ENCOUNTER — Ambulatory Visit (INDEPENDENT_AMBULATORY_CARE_PROVIDER_SITE_OTHER): Payer: Medicare Other | Admitting: Podiatry

## 2018-03-07 ENCOUNTER — Encounter: Payer: Self-pay | Admitting: Podiatry

## 2018-03-07 DIAGNOSIS — M79674 Pain in right toe(s): Secondary | ICD-10-CM | POA: Diagnosis not present

## 2018-03-07 DIAGNOSIS — M79675 Pain in left toe(s): Secondary | ICD-10-CM

## 2018-03-07 DIAGNOSIS — B351 Tinea unguium: Secondary | ICD-10-CM | POA: Diagnosis not present

## 2018-03-07 DIAGNOSIS — E1142 Type 2 diabetes mellitus with diabetic polyneuropathy: Secondary | ICD-10-CM

## 2018-03-13 ENCOUNTER — Encounter: Payer: Self-pay | Admitting: Podiatry

## 2018-03-13 NOTE — Progress Notes (Signed)
Subjective: Alexis Farmer presents today for follow up of diabetic foot care and management of painful, mycotic toenails.  Pain is  relieved with periodic professional debridement.  Objective: Vascular Examination: Capillary refill time <3 seconds x 10 digits Dorsalis pedis pulses present b/l Posterior tibial pulses diminished b/l No digital hair x 10 digits Skin temperature warm to warm b/l  Dermatological Examination: Skin with normal turgor, texture and tone b/l Toenails -5 b/l discolored, thick, dystrophic with subungual debris and pain with palpation to nailbeds due to thickness of nails. No signs of nail border inflammation 1-5 b/l  Musculoskeletal: Muscle strength 5/5 to all LE muscle groups  Neurological: Sensation diminished with 10 gram monofilament. Vibratory sensation diminished  Last A1c: 9.7 mg/dL, June 2019  Assessment: 1. Painful onychomycosis toenails 1-5 b/l 2. NIDDM with Diabetic neuropathy  Plan: 1. Continue diabetic foot care principles.  2. Toenails 1-5 b/l were debrided in length and girth without iatrogenic bleeding. 3. Patient to continue soft, supportive shoe gear 4. Patient to report any pedal injuries to medical professional  5. Follow up 10weeks. Patient/POA to call should there be a concern in the interim.

## 2018-03-19 ENCOUNTER — Ambulatory Visit: Payer: Self-pay | Admitting: Internal Medicine

## 2018-03-20 ENCOUNTER — Telehealth: Payer: Self-pay

## 2018-03-20 NOTE — Telephone Encounter (Signed)
Any place in particular you would like to see patient?

## 2018-03-20 NOTE — Telephone Encounter (Signed)
Patient scheduled for 1300 tomorrow.

## 2018-03-20 NOTE — Telephone Encounter (Signed)
Copied from [place] [phone]. Topic: Appointment Scheduling - Scheduling Inquiry for Clinic >> Mar 20, 2018 12:05 PM Scherrie Gerlach wrote: Reason for CRM: pt had to cancel 10/02 appt with Dr Linus Orn, would like to be worked in in a couple of weeks.  Np appts.  Please advise

## 2018-03-20 NOTE — Telephone Encounter (Signed)
Do you have a 7:45 am appt for 1 pm for this patient   Alexis Farmer

## 2018-03-21 ENCOUNTER — Ambulatory Visit: Payer: Self-pay | Admitting: Internal Medicine

## 2018-03-25 ENCOUNTER — Encounter: Payer: Self-pay | Admitting: Internal Medicine

## 2018-03-25 ENCOUNTER — Ambulatory Visit (INDEPENDENT_AMBULATORY_CARE_PROVIDER_SITE_OTHER): Payer: Medicare Other | Admitting: Internal Medicine

## 2018-03-25 VITALS — BP 140/60 | HR 76 | Temp 98.7°F | Ht 63.0 in | Wt 267.8 lb

## 2018-03-25 DIAGNOSIS — R269 Unspecified abnormalities of gait and mobility: Secondary | ICD-10-CM | POA: Diagnosis not present

## 2018-03-25 DIAGNOSIS — N183 Chronic kidney disease, stage 3 unspecified: Secondary | ICD-10-CM

## 2018-03-25 DIAGNOSIS — Z23 Encounter for immunization: Secondary | ICD-10-CM | POA: Diagnosis not present

## 2018-03-25 DIAGNOSIS — M25511 Pain in right shoulder: Secondary | ICD-10-CM

## 2018-03-25 DIAGNOSIS — W19XXXA Unspecified fall, initial encounter: Secondary | ICD-10-CM | POA: Diagnosis not present

## 2018-03-25 DIAGNOSIS — E1165 Type 2 diabetes mellitus with hyperglycemia: Secondary | ICD-10-CM

## 2018-03-25 DIAGNOSIS — G8929 Other chronic pain: Secondary | ICD-10-CM

## 2018-03-25 DIAGNOSIS — F330 Major depressive disorder, recurrent, mild: Secondary | ICD-10-CM

## 2018-03-25 DIAGNOSIS — M1990 Unspecified osteoarthritis, unspecified site: Secondary | ICD-10-CM

## 2018-03-25 DIAGNOSIS — Z794 Long term (current) use of insulin: Secondary | ICD-10-CM

## 2018-03-25 MED ORDER — "SYRINGE/NEEDLE (DISP) 30G X 1/2"" 1 ML MISC": 3 refills | Status: DC

## 2018-03-25 NOTE — Patient Instructions (Addendum)
Tylenol 500 mg up to 6 pills in 1 days  Take Januvia 1/2 pill daily=50 mg in the am for diabetes    Sitagliptin oral tablet What is this medicine? SITAGLIPTIN (sit a GLIP tin) helps to treat type 2 diabetes. It helps to control blood sugar. Treatment is combined with diet and exercise. This medicine may be used for other purposes; ask your health care provider or pharmacist if you have questions. COMMON BRAND NAME(S): Januvia What should I tell my health care provider before I take this medicine? They need to know if you have any of these conditions: -diabetic ketoacidosis -kidney disease -pancreatitis -previous swelling of the tongue, face, or lips with difficulty breathing, difficulty swallowing, hoarseness, or tightening of the throat -type 1 diabetes -an unusual or allergic reaction to sitagliptin, other medicines, foods, dyes, or preservatives -pregnant or trying to get pregnant -breast-feeding How should I use this medicine? Take this medicine by mouth with a glass of water. Follow the directions on the prescription label. You can take it with or without food. Do not cut, crush or chew this medicine. Take your dose at the same time each day. Do not take more often than directed. Do not stop taking except on your doctor's advice. A special MedGuide will be given to you by the pharmacist with each prescription and refill. Be sure to read this information carefully each time. Talk to your pediatrician regarding the use of this medicine in children. Special care may be needed. Overdosage: If you think you have taken too much of this medicine contact a poison control center or emergency room at once. NOTE: This medicine is only for you. Do not share this medicine with others. What if I miss a dose? If you miss a dose, take it as soon as you can. If it is almost time for your next dose, take only that dose. Do not take double or extra doses. What may interact with this medicine? Do not  take this medicine with any of the following medications: -gatifloxacin This medicine may also interact with the following medications: -alcohol -digoxin -insulin -sulfonylureas like glimepiride, glipizide, glyburide This list may not describe all possible interactions. Give your health care provider a list of all the medicines, herbs, non-prescription drugs, or dietary supplements you use. Also tell them if you smoke, drink alcohol, or use illegal drugs. Some items may interact with your medicine. What should I watch for while using this medicine? Visit your doctor or health care professional for regular checks on your progress. A test called the HbA1C (A1C) will be monitored. This is a simple blood test. It measures your blood sugar control over the last 2 to 3 months. You will receive this test every 3 to 6 months. Learn how to check your blood sugar. Learn the symptoms of low and high blood sugar and how to manage them. Always carry a quick-source of sugar with you in case you have symptoms of low blood sugar. Examples include hard sugar candy or glucose tablets. Make sure others know that you can choke if you eat or drink when you develop serious symptoms of low blood sugar, such as seizures or unconsciousness. They must get medical help at once. Tell your doctor or health care professional if you have high blood sugar. You might need to change the dose of your medicine. If you are sick or exercising more than usual, you might need to change the dose of your medicine. Do not skip meals. Ask your  doctor or health care professional if you should avoid alcohol. Many nonprescription cough and cold products contain sugar or alcohol. These can affect blood sugar. Wear a medical ID bracelet or chain, and carry a card that describes your disease and details of your medicine and dosage times. What side effects may I notice from receiving this medicine? Side effects that you should report to your doctor  or health care professional as soon as possible: -allergic reactions like skin rash, itching or hives, swelling of the face, lips, or tongue -breathing problems -general ill feeling or flu-like symptoms -joint pain -loss of appetite -redness, blistering, peeling or loosening of the skin, including inside the mouth -signs and symptoms of low blood sugar such as feeling anxious, confusion, dizziness, increased hunger, unusually weak or tired, sweating, shakiness, cold, irritable, headache, blurred vision, fast heartbeat, loss of consciousness -unusual stomach pain or discomfort -vomiting Side effects that usually do not require medical attention (report to your doctor or health care professional if they continue or are bothersome): -diarrhea -headache -sore throat -stomach upset -stuffy or runny nose This list may not describe all possible side effects. Call your doctor for medical advice about side effects. You may report side effects to FDA at 1-800-FDA-1088. Where should I keep my medicine? Keep out of the reach of children. Store at room temperature between 15 and 30 degrees C (59 and 86 degrees F). Throw away any unused medicine after the expiration date. NOTE: This sheet is a summary. It may not cover all possible information. If you have questions about this medicine, talk to your doctor, pharmacist, or health care provider.  2018 Elsevier/Gold Standard (2015-12-02 14:23:55)   Falls   It is important to avoid accidents which may result in broken bones.  Here are a few ideas on how to make your home safer so you will be less likely to trip or fall.  1. Use nonskid mats or non slip strips in your shower or tub, on your bathroom floor and around sinks.  If you know that you have spilled water, wipe it up! 2. In the bathroom, it is important to have properly installed grab bars on the walls or on the edge of the tub.  Towel racks are NOT strong enough for you to hold onto or to pull  on for support. 3. Stairs and hallways should have enough light.  Add lamps or night lights if you need ore light. 4. It is good to have handrails on both sides of the stairs if possible.  Always fix broken handrails right away. 5. It is important to see the edges of steps.  Paint the edges of outdoor steps white so you can see them better.  Put colored tape on the edge of inside steps. 6. Throw-rugs are dangerous because they can slide.  Removing the rugs is the best idea, but if they must stay, add adhesive carpet tape to prevent slipping. 7. Do not keep things on stairs or in the halls.  Remove small furniture that blocks the halls as it may cause you to trip.  Keep telephone and electrical cords out of the way where you walk. 8. Always were sturdy, rubber-soled shoes for good support.  Never wear just socks, especially on the stairs.  Socks may cause you to slip or fall.  Do not wear full-length housecoats as you can easily trip on the bottom.  9. Place the things you use the most on the shelves that are the easiest to  reach.  If you use a stepstool, make sure it is in good condition.  If you feel unsteady, DO NOT climb, ask for help. 10. If a health professional advises you to use a cane or walker, do not be ashamed.  These items can keep you from falling and breaking your bones.

## 2018-03-25 NOTE — Progress Notes (Addendum)
Chief Complaint  Patient presents with  . Follow-up   F/u  1. DM 2 uncontrolled Dr. Candiss Norse labs 03/17/18 9.7 11/22/17 to 10.4 Cr 1.19 BUN 15, Cr 50 (CKD 3), K 4.8, CBC normal 6.6/ 12.3/365., 207, PTH 34, UA 2+glucose no blood  2. Falls x 2 this year recently 2 weeks ago fell in shower requesting shower chair and lift for the toilet seat hard to get up c/o body aches and arthritis (h/o degenerative changes in back and neck). Also c/o right shoulder pain with mild OA and ? Rotator cuff in right shoulder per Xray 11/2016.  3. C/o depression due to losing some of independence and having to rely on others she used to be more active and friends are in [place] does not want to start medications for now. She also reports short term memory problems and she is thinking about how she will pass one day.     Review of Systems  Constitutional: Negative for weight loss.  HENT: Negative for hearing loss.   Eyes: Negative for blurred vision.  Respiratory: Negative for shortness of breath.   Cardiovascular: Negative for chest pain.  Gastrointestinal: Negative for abdominal pain.  Musculoskeletal: Positive for back pain, joint pain and neck pain.  Skin: Negative for rash.  Neurological: Negative for headaches.  Psychiatric/Behavioral: Positive for depression and memory loss.   Past Medical History:  Diagnosis Date  . Arthritis   . Chickenpox   . Diabetes mellitus without complication ([place])    one elevated reading/ no treatment  . Diverticulitis   . GI bleed   . High cholesterol   . History of blood transfusion   . Hypertension   . Renal insufficiency    Past Surgical History:  Procedure Laterality Date  . APPENDECTOMY    . ECTOPIC PREGNANCY SURGERY    . EYE SURGERY     bilateral cataracts  . gallbladder     . HYSTEROSCOPY W/D&C N/A 10/26/2016   Procedure: DILATATION AND CURETTAGE /HYSTEROSCOPY;  Surgeon: Benjaman Kindler, MD;  Location: [facility] ORS;  Service: Gynecology;  Laterality: N/A;  .  THYROIDECTOMY, PARTIAL     Family History  Problem Relation Age of Onset  . Diabetes Mother   . Hypertension Mother   . Stroke Mother   . Diabetes Other   . Diabetes Sister   . Heart disease Sister    Social History   Socioeconomic History  . Marital status: Widowed    Spouse name: Not on file  . Number of children: Not on file  . Years of education: Not on file  . Highest education level: Not on file  Occupational History  . Not on file  Social Needs  . Financial resource strain: Not on file  . Food insecurity:    Worry: Not on file    Inability: Not on file  . Transportation needs:    Medical: Not on file    Non-medical: Not on file  Tobacco Use  . Smoking status: Never Smoker  . Smokeless tobacco: Never Used  Substance and Sexual Activity  . Alcohol use: No  . Drug use: No  . Sexual activity: Not on file  Lifestyle  . Physical activity:    Days per week: Not on file    Minutes per session: Not on file  . Stress: Not on file  Relationships  . Social connections:    Talks on phone: Not on file    Gets together: Not on file    Attends religious  service: Not on file    Active member of club or organization: Not on file    Attends meetings of clubs or organizations: Not on file    Relationship status: Not on file  . Intimate partner violence:    Fear of current or ex partner: Not on file    Emotionally abused: Not on file    Physically abused: Not on file    Forced sexual activity: Not on file  Other Topics Concern  . Not on file  Social History Narrative  . Not on file   Current Meds  Medication Sig  . B-D INS SYRINGE 0.5CC/30GX1/2" 30G X 1/2" 0.5 ML MISC USE AS DIRECTED  . blood glucose meter kit and supplies KIT Accu chek, Dx code E11.65, check 3 times daily  . cloNIDine (CATAPRES) 0.2 MG tablet TAKE 1 TABLET (0.2 MG TOTAL) BY MOUTH 2 (TWO) TIMES DAILY. (Patient taking differently: Take 0.2 mg by mouth 3 (three) times daily. )  . glucose blood ([employer]  BLOOD GLUCOSE TEST) test strip USE TO CHECK CBG'S 3 TIMES DAILY. (Patient taking differently: USE TO CHECK CBG'S 3 TIMES DAILY. Guid test strips)  . insulin regular (NOVOLIN R) 100 units/mL injection INJECT 15 UNITS TOTAL INTO THE SKIN 3 (THREE) TIMES DAILY BEFORE MEALS. (Patient taking differently: INJECT 15 UNITS TOTAL INTO THE SKIN 3 (THREE) TIMES DAILY BEFORE MEALS. 4 Vials for 90 day supply)  . Multiple Vitamin (MULTIVITAMIN WITH MINERALS) TABS tablet Take 1 tablet by mouth daily.  Marland Kitchen NOVOLIN N 100 UNIT/ML injection INJECT 25 UNITS IN THE MORNING AND 15-20 UNITS IN THE EVENING BEFORE A MEAL  . olmesartan (BENICAR) 40 MG tablet Take 1 tablet (40 mg total) by mouth daily.  . rosuvastatin (CRESTOR) 10 MG tablet TAKE 1 TABLET (10 MG TOTAL) BY MOUTH DAILY.  Marland Kitchen spironolactone (ALDACTONE) 25 MG tablet Take 1 tablet (25 mg total) by mouth daily.  . Syringe/Needle, Disp, (B-D ECLIPSE SYRINGE) 30G X 1/2" 1 ML MISC Use as directed with insulin DX:E11.65  . torsemide (DEMADEX) 20 MG tablet Take 1 tablet (20 mg total) by mouth 2 (two) times daily.  . [DISCONTINUED] doxycycline (VIBRA-TABS) 100 MG tablet Take 1 tablet (100 mg total) by mouth 2 (two) times daily. With food and water x 1 week  . [DISCONTINUED] Syringe/Needle, Disp, (B-D ECLIPSE SYRINGE) 30G X 1/2" 1 ML MISC Use as directed with insulin DX:E11.65   Allergies  Allergen Reactions  . Penicillins Rash    Has patient had a PCN reaction causing immediate rash, facial/tongue/throat swelling, SOB or lightheadedness with hypotension: Yes Has patient had a PCN reaction causing severe rash involving mucus membranes or skin necrosis: No Has patient had a PCN reaction that required hospitalization No Has patient had a PCN reaction occurring within the last 10 years: Yes If all of the above answers are "NO", then may proceed with Cephalosporin use.    Recent Results (from the past 2160 hour(s))  Comprehensive metabolic panel     Status: Abnormal    Collection Time: 01/20/18 11:05 AM  Result Value Ref Range   Sodium 137 135 - 145 mEq/L   Potassium 4.8 3.5 - 5.1 mEq/L   Chloride 99 96 - 112 mEq/L   CO2 33 (H) 19 - 32 mEq/L   Glucose, Bld 233 (H) 70 - 99 mg/dL   BUN 20 6 - 23 mg/dL   Creatinine, Ser 1.33 (H) 0.40 - 1.20 mg/dL   Total Bilirubin 0.6 0.2 - 1.2 mg/dL  Alkaline Phosphatase 41 39 - 117 U/L   AST 23 0 - 37 U/L   ALT 20 0 - 35 U/L   Total Protein 7.3 6.0 - 8.3 g/dL   Albumin 4.2 3.5 - 5.2 g/dL   Calcium 10.3 8.4 - 10.5 mg/dL   GFR 49.49 (L) >60.00 mL/min  CBC with Differential/Platelet     Status: Abnormal   Collection Time: 01/20/18 11:05 AM  Result Value Ref Range   WBC 8.0 4.0 - 10.5 K/uL    Comment: Manual smear review agrees with instrument differential.   RBC 4.18 3.87 - 5.11 Mil/uL   Hemoglobin 12.4 12.0 - 15.0 g/dL   HCT 37.7 36.0 - 46.0 %   MCV 90.1 78.0 - 100.0 fl   MCHC 33.0 30.0 - 36.0 g/dL   RDW 13.8 11.5 - 15.5 %   Platelets 208.0 150.0 - 400.0 K/uL   Neutrophils Relative % 28.0 (L) 43.0 - 77.0 %   Lymphocytes Relative 55.8 Repeated and verified X2. (H) 12.0 - 46.0 %   Monocytes Relative 6.8 3.0 - 12.0 %   Eosinophils Relative 7.3 (H) 0.0 - 5.0 %   Basophils Relative 2.1 0.0 - 3.0 %   Neutro Abs 2.2 1.4 - 7.7 K/uL   Lymphs Abs 4.5 (H) 0.7 - 4.0 K/uL   Monocytes Absolute 0.5 0.1 - 1.0 K/uL   Eosinophils Absolute 0.6 0.0 - 0.7 K/uL   Basophils Absolute 0.2 (H) 0.0 - 0.1 K/uL  Urine Microalbumin w/creat. ratio     Status: None   Collection Time: 01/20/18 11:05 AM  Result Value Ref Range   Microalb, Ur <0.7 0.0 - 1.9 mg/dL   Creatinine,U 45.1 mg/dL   Microalb Creat Ratio 1.6 0.0 - 30.0 mg/g  Lipid panel     Status: Abnormal   Collection Time: 01/20/18 11:05 AM  Result Value Ref Range   Cholesterol 141 0 - 200 mg/dL    Comment: ATP III Classification       Desirable:  < 200 mg/dL               Borderline High:  200 - 239 mg/dL          High:  > = 240 mg/dL   Triglycerides 152.0 (H) 0.0 - 149.0  mg/dL    Comment: Normal:  <150 mg/dLBorderline High:  150 - 199 mg/dL   HDL 44.20 >39.00 mg/dL   VLDL 30.4 0.0 - 40.0 mg/dL   LDL Cholesterol 66 0 - 99 mg/dL   Total CHOL/HDL Ratio 3     Comment:                Men          Women1/2 Average Risk     3.4          3.3Average Risk          5.0          4.42X Average Risk          9.6          7.13X Average Risk          15.0          11.0                       NonHDL 96.38     Comment: NOTE:  Non-HDL goal should be 30 mg/dL higher than patient's LDL goal (i.e. LDL goal of < 70 mg/dL, would have non-HDL  goal of < 100 mg/dL)  Measles/Mumps/Rubella Immunity     Status: None   Collection Time: 01/20/18 11:05 AM  Result Value Ref Range   Rubeola IgG >300.00 AU/mL    Comment: AU/mL            Interpretation -----            -------------- <25.00           Negative 25.00-29.99      Equivocal >29.99           Positive . A positive result indicates that the patient has antibody to measles virus. It does not differentiate  between an active or past infection. The clinical  diagnosis must be interpreted in conjunction with  clinical signs and symptoms of the patient.    Mumps IgG 54.70 AU/mL    Comment:  AU/mL           Interpretation -------         ---------------- <9.00             Negative 9.00-10.99        Equivocal >10.99            Positive A positive result indicates that the patient has  antibody to mumps virus. It does not differentiate between an  active or past infection. The clinical diagnosis must be interpreted in conjunction with clinical signs and symptoms of the patient. .    Rubella >33.00 index    Comment:     Index            Interpretation     -----            --------------       <0.90            Not consistent with Immunity     0.90-0.99        Equivocal     > or = 1.00      Consistent with Immunity  . The presence of rubella IgG antibody suggests  immunization or past or current infection with rubella  virus.   Vitamin D (25 hydroxy)     Status: None   Collection Time: 01/20/18 11:05 AM  Result Value Ref Range   VITD 43.23 30.00 - 100.00 ng/mL  TSH     Status: None   Collection Time: 01/20/18 11:05 AM  Result Value Ref Range   TSH 1.86 0.35 - 4.50 uIU/mL  T4, free     Status: None   Collection Time: 01/20/18 11:05 AM  Result Value Ref Range   Free T4 0.85 0.60 - 1.60 ng/dL    Comment: Specimens from patients who are undergoing biotin therapy and /or ingesting biotin supplements may contain high levels of biotin.  The higher biotin concentration in these specimens interferes with this Free T4 assay.  Specimens that contain high levels  of biotin may cause false high results for this Free T4 assay.  Please interpret results in light of the total clinical presentation of the patient.    T3, free     Status: None   Collection Time: 01/20/18 11:05 AM  Result Value Ref Range   T3, Free 2.8 2.3 - 4.2 pg/mL   Objective  Body mass index is 47.44 kg/m. Wt Readings from Last 3 Encounters:  03/25/18 267 lb 12.8 oz (121.5 kg)  02/12/18 267 lb 12.8 oz (121.5 kg)  01/20/18 269 lb 6.4 oz (122.2 kg)   Temp Readings from Last 3  Encounters:  03/25/18 98.7 F (37.1 C) (Oral)  02/12/18 99.2 F (37.3 C) (Oral)  01/20/18 98.5 F (36.9 C) (Oral)   BP Readings from Last 3 Encounters:  03/25/18 140/60  02/12/18 (!) 160/78  01/20/18 (!) 144/62   Pulse Readings from Last 3 Encounters:  03/25/18 76  02/12/18 77  01/20/18 72    Physical Exam  Constitutional: She is oriented to person, place, and time. Vital signs are normal. She appears well-developed and well-nourished. She is cooperative.  HENT:  Head: Normocephalic and atraumatic.  Mouth/Throat: Oropharynx is clear and moist and mucous membranes are normal.  Eyes: Pupils are equal, round, and reactive to light. Conjunctivae are normal.  Cardiovascular: Normal rate, regular rhythm and normal heart sounds.  Pulmonary/Chest: Effort normal  and breath sounds normal.  Neurological: She is alert and oriented to person, place, and time. Gait normal.  Skin: Skin is warm, dry and intact.  Psychiatric: She has a normal mood and affect. Her speech is normal and behavior is normal. Judgment and thought content normal.  Nursing note and vitals reviewed.   Assessment   1. DM 2 A1C 10.4 03/17/18  2. Falls x 2 over last 1 year  3. 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 Depression and memory problems ? If related to mood  5. HM Plan  1. Given samples Januvia 100 mg x 14 (1 box) to cut in 1/2 to add on Novolin R and Novolin N  Check A1C in 3 months    Novolin N taking 25 am and 20 pm with novololin R 15 tid but gave SSI to do based on cbgs  -will see if she will be willing to change to tresiba and novolog in future or increase novolin N to 30 units am and 20 units pm  Pt called back declined to take januvia 50 mg samples qd due to c/w side effecst   2. Rx shower chair and toilet seat lift  [employer] called requesting H/H aid for bathing and toileting ordered and will also order H/H PT/OT if needed for falls  3. Prn Tylenol given CKD 3  If bothersome will refer to ortho  4. Declines medications for now  5. Flu shot given today Check on other vaccines (I.e Tdap, prevnar, pna 23, shingrix)  mammo had 08/17/17 normal [facility]  No pap 2/2 age  DEXA pt declines for now  Colonoscopy  Dr. Allen Norris in [place]  -09/10/14 diverticulosis sessile polyp Consider check hep C though not in age window  CT chest never smoker   02/03/18 thyroid bx benign bx left middle and lower thyroid  Saw renal 03/17/18 CKD 3 2/2 diabetes BP elevated 139/68,142/84 considering ACEI/ARB f/u in 6 months Dr. Candiss Norse  Provider: Dr. Olivia Mackie [facility]

## 2018-03-25 NOTE — Progress Notes (Signed)
Pre visit review using our clinic review tool, if applicable. No additional management support is needed unless otherwise documented below in the visit note. 

## 2018-03-26 ENCOUNTER — Telehealth: Payer: Self-pay | Admitting: Internal Medicine

## 2018-03-26 ENCOUNTER — Encounter: Payer: Self-pay | Admitting: Internal Medicine

## 2018-03-26 DIAGNOSIS — W19XXXA Unspecified fall, initial encounter: Secondary | ICD-10-CM | POA: Insufficient documentation

## 2018-03-26 DIAGNOSIS — R269 Unspecified abnormalities of gait and mobility: Secondary | ICD-10-CM | POA: Insufficient documentation

## 2018-03-26 DIAGNOSIS — M1990 Unspecified osteoarthritis, unspecified site: Secondary | ICD-10-CM | POA: Insufficient documentation

## 2018-03-26 HISTORY — DX: Unspecified fall, initial encounter: W19.XXXA

## 2018-03-26 NOTE — Telephone Encounter (Signed)
Her A1C is 10.4 we need to act and do something   Is she willing to change insulin to tresiba (long acting) and novolog (short acting as needed) in future or increase novolin N to 30 units am and 20 units pm ???  I have not had pts with side effects to [place]

## 2018-03-26 NOTE — Telephone Encounter (Signed)
Patient stated she is not going to take the [place] pill due to the risks.

## 2018-03-26 NOTE — Telephone Encounter (Signed)
Copied from [place] [id number]. Topic: General - Other >> Mar 26, 2018  2:56 PM Yvette Rack wrote: Reason for CRM: Pt states she was given a sample medication on 03/25/18 and she would like to speak with Dr. Olivia Mackie or her nurse. Cb# [phone]

## 2018-03-27 NOTE — Telephone Encounter (Signed)
Pt called back and stated that she not interested in any meds.  She has been on a serious diet and is going to try it her way 1st.  She has cut back all soda and sugary foods.  She made a fu for jan to recheck it.  She stated she knows what she has been doing and wants to try it her way 1st.

## 2018-03-27 NOTE — Telephone Encounter (Signed)
FYI

## 2018-03-27 NOTE — Telephone Encounter (Signed)
Left message for patient to return call back. [facility] may give and obtain information.  

## 2018-03-31 ENCOUNTER — Telehealth: Payer: Self-pay | Admitting: Internal Medicine

## 2018-03-31 NOTE — Telephone Encounter (Signed)
Copied from [place] [id number]. Topic: General - Other >> Mar 31, 2018  1:25 PM Lennox Solders wrote: Reason for SFJ:FJKNIOC [employer] insurance is calling to see if dr Terese Door would be in order for this pt to have home health aid . Pt need assistant with taking bathing and getting off toilet.

## 2018-03-31 NOTE — Telephone Encounter (Signed)
Patient is calling stating she would like to speak to a nurse concerning script that was given to her

## 2018-03-31 NOTE — Addendum Note (Signed)
Addended by: Orland Mustard on: 03/31/2018 08:59 PM   Modules accepted: Orders

## 2018-04-07 ENCOUNTER — Telehealth: Payer: Self-pay | Admitting: Internal Medicine

## 2018-04-07 NOTE — Telephone Encounter (Signed)
Copied from [place] [id number]. Topic: Quick Communication - Home Health Verbal Orders >> Apr 07, 2018  1:37 PM Bea Graff, NT wrote: Caller/AgencyConstance Haw, [facility] Callback Number: [phone] Requesting OT/PT/Skilled Nursing/Social Work: PT Frequency: 2 times a week for 4 weeks, and 1 time a week for 2 weeks. Also requesting nursing eval for diabetes management.

## 2018-04-07 NOTE — Telephone Encounter (Signed)
Copied from [place] [id number]. Topic: Quick Communication - Home Health Verbal Orders >> Apr 07, 2018  4:42 PM Blase Mess A wrote: Caller/Agency: Will Schalla / [facility] Number: [phone] to leave ok on vm to confirm orders Requesting OT/PT/Skilled Nursing/Social Work: OT  Frequency: 1X for 3 weeks, Pain score 8 out 10

## 2018-04-07 NOTE — Telephone Encounter (Signed)
Approved  Call # back   Thanks Max [place]ows

## 2018-04-07 NOTE — Telephone Encounter (Signed)
Left detailed message on [place] VM to give verbal order.

## 2018-04-15 ENCOUNTER — Ambulatory Visit: Payer: Self-pay | Admitting: *Deleted

## 2018-04-15 NOTE — Telephone Encounter (Signed)
PT assistant Alexis Farmer with [facility] is with pt.     Pt is reporting a pain level of 8 today.   Anything over a 6 on the pain scale Alexis Farmer said they are required to report.   She usually feels better as the day goes by.   This is not unusual for her.      Pt has chronic pain and this is usual for her.  She using aspirin and Aspercream helps the pain.     I have routed this to Dr. Terese Door.    (The phones are down at the [employer] now).   They are being worked on now.

## 2018-04-15 NOTE — Telephone Encounter (Signed)
Call pt where is pain call patient ?  If she has not been seen by me for this she will need appt to further w/u and do Xrays if she has not had in the past/   Urine culture with small amts of group B strep if she is having sx's of UTI we can treat with Augmentin if she is not allergic, is she allergic or having UTI sx's?   [place]

## 2018-04-16 ENCOUNTER — Other Ambulatory Visit: Payer: Self-pay | Admitting: Internal Medicine

## 2018-04-16 DIAGNOSIS — M542 Cervicalgia: Secondary | ICD-10-CM

## 2018-04-16 DIAGNOSIS — M25519 Pain in unspecified shoulder: Secondary | ICD-10-CM

## 2018-04-16 MED ORDER — TRAMADOL HCL 50 MG PO TABS: 50.0000 mg | ORAL_TABLET | Freq: Two times a day (BID) | ORAL | 0 refills | Status: DC | PRN

## 2018-04-16 NOTE — Telephone Encounter (Signed)
We can try Tramadol for pain 10 tablets can take 1-2 tablets as needed If neck is bothering her I rec MRI cervical, does she want to do that ?  If right shoulder bothering of knees rec ortho consult  -does she want referral ?  For mild bacteria in urine there is no antibiotic we can use to treat due to allergies and this is not enough bacteria to call a UTI  I rec otc cranberry with D mannose supplement daily and increase water intake   TMS

## 2018-04-16 NOTE — Telephone Encounter (Signed)
Patient stated she was in pain all over her bidy and it has been going on for years. She stated she is allergic to Penicillin only. She is having some pain during urination and in right side of back.

## 2018-04-17 NOTE — Telephone Encounter (Signed)
Patient called and said she was called yesterday told in her urine had something in it, she has questions about why she was not given medication for it.   I called the patient to discuss her question above. She says that she was told to drink cranberry juice and water. She says she can't take PCN, but would like something prescribed. She says she doesn't have allergies. She says she does not want this infection to go up into her kidneys. She asks for a call back to discuss further.

## 2018-04-17 NOTE — Telephone Encounter (Signed)
This encounter was created in error - please disregard.

## 2018-04-17 NOTE — Telephone Encounter (Signed)
For mild bacteria in urine there is no antibiotic we can use to treat due to allergies and this is not enough bacteria to call a UTI  I rec otc cranberry with D mannose supplement daily and increase water intake   Call pt to explain if we given her keflex may cause rash like penicillin no other antibiotic options  Does she want to risk trying keflex?     [place]

## 2018-04-17 NOTE — Telephone Encounter (Signed)
Spoken to patient. She does not want tramadol. She wants to hold off on PT.  She stated she doesn't want MRI becuae she just had a CT scan in June.

## 2018-04-18 ENCOUNTER — Ambulatory Visit: Payer: Medicare Other

## 2018-04-18 NOTE — Telephone Encounter (Signed)
Patient stated she will hold off on the keflex and try the firstway.

## 2018-05-13 ENCOUNTER — Telehealth: Payer: Self-pay | Admitting: Internal Medicine

## 2018-05-13 NOTE — Telephone Encounter (Signed)
Discharged from PT 04/18/18   [place]

## 2018-05-25 ENCOUNTER — Other Ambulatory Visit: Payer: Self-pay | Admitting: Family Medicine

## 2018-06-06 ENCOUNTER — Ambulatory Visit (INDEPENDENT_AMBULATORY_CARE_PROVIDER_SITE_OTHER): Payer: Medicare Other | Admitting: Podiatry

## 2018-06-06 DIAGNOSIS — B351 Tinea unguium: Secondary | ICD-10-CM

## 2018-06-06 DIAGNOSIS — M79675 Pain in left toe(s): Secondary | ICD-10-CM

## 2018-06-06 DIAGNOSIS — M79674 Pain in right toe(s): Secondary | ICD-10-CM

## 2018-06-06 NOTE — Patient Instructions (Signed)
Diabetes Mellitus and Foot Care °Foot care is an important part of your health, especially when you have diabetes. Diabetes may cause you to have problems because of poor blood flow (circulation) to your feet and legs, which can cause your skin to: °· Become thinner and drier. °· Break more easily. °· Heal more slowly. °· Peel and crack. °You may also have nerve damage (neuropathy) in your legs and feet, causing decreased feeling in them. This means that you may not notice minor injuries to your feet that could lead to more serious problems. Noticing and addressing any potential problems early is the best way to prevent future foot problems. °How to care for your feet °Foot hygiene °· Wash your feet daily with warm water and mild soap. Do not use hot water. Then, pat your feet and the areas between your toes until they are completely dry. Do not soak your feet as this can dry your skin. °· Trim your toenails straight across. Do not dig under them or around the cuticle. File the edges of your nails with an emery board or nail file. °· Apply a moisturizing lotion or petroleum jelly to the skin on your feet and to dry, brittle toenails. Use lotion that does not contain alcohol and is unscented. Do not apply lotion between your toes. °Shoes and socks °· Wear clean socks or stockings every day. Make sure they are not too tight. Do not wear knee-high stockings since they may decrease blood flow to your legs. °· Wear shoes that fit properly and have enough cushioning. Always look in your shoes before you put them on to be sure there are no objects inside. °· To break in new shoes, wear them for just a few hours a day. This prevents injuries on your feet. °Wounds, scrapes, corns, and calluses °· Check your feet daily for blisters, cuts, bruises, sores, and redness. If you cannot see the bottom of your feet, use a mirror or ask someone for help. °· Do not cut corns or calluses or try to remove them with medicine. °· If you  find a minor scrape, cut, or break in the skin on your feet, keep it and the skin around it clean and dry. You may clean these areas with mild soap and water. Do not clean the area with peroxide, alcohol, or iodine. °· If you have a wound, scrape, corn, or callus on your foot, look at it several times a day to make sure it is healing and not infected. Check for: °? Redness, swelling, or pain. °? Fluid or blood. °? Warmth. °? Pus or a bad smell. °General instructions °· Do not cross your legs. This may decrease blood flow to your feet. °· Do not use heating pads or hot water bottles on your feet. They may burn your skin. If you have lost feeling in your feet or legs, you may not know this is happening until it is too late. °· Protect your feet from hot and cold by wearing shoes, such as at the beach or on hot pavement. °· Schedule a complete foot exam at least once a year (annually) or more often if you have foot problems. If you have foot problems, report any cuts, sores, or bruises to your health care provider immediately. °Contact a health care provider if: °· You have a medical condition that increases your risk of infection and you have any cuts, sores, or bruises on your feet. °· You have an injury that is not   healing. °· You have redness on your legs or feet. °· You feel burning or tingling in your legs or feet. °· You have pain or cramps in your legs and feet. °· Your legs or feet are numb. °· Your feet always feel cold. °· You have pain around a toenail. °Get help right away if: °· You have a wound, scrape, corn, or callus on your foot and: °? You have pain, swelling, or redness that gets worse. °? You have fluid or blood coming from the wound, scrape, corn, or callus. °? Your wound, scrape, corn, or callus feels warm to the touch. °? You have pus or a bad smell coming from the wound, scrape, corn, or callus. °? You have a fever. °? You have a red line going up your leg. °Summary °· Check your feet every day  for cuts, sores, red spots, swelling, and blisters. °· Moisturize feet and legs daily. °· Wear shoes that fit properly and have enough cushioning. °· If you have foot problems, report any cuts, sores, or bruises to your health care provider immediately. °· Schedule a complete foot exam at least once a year (annually) or more often if you have foot problems. °This information is not intended to replace advice given to you by your health care provider. Make sure you discuss any questions you have with your health care provider. °Document Released: 06/01/2000 Document Revised: 07/17/2017 Document Reviewed: 07/06/2016 °Elsevier Interactive Patient Education © 2019 [employer] ° °Diabetic Neuropathy °Diabetic neuropathy refers to nerve damage that is caused by diabetes (diabetes mellitus). Over time, people with diabetes can develop nerve damage throughout the body. There are several types of diabetic neuropathy: °· Peripheral neuropathy. This is the most common type of diabetic neuropathy. It causes damage to nerves that carry signals between the spinal cord and other parts of the body (peripheral nerves). This usually affects nerves in the feet and legs first, and may eventually affect the hands and arms. The damage affects the ability to sense touch or temperature. °· Autonomic neuropathy. This type causes damage to nerves that control involuntary functions (autonomic nerves). These nerves carry signals that control: °? Heartbeat. °? Body temperature. °? Blood pressure. °? Urination. °? Digestion. °? Sweating. °? Sexual function. °? Response to changing blood sugar (glucose) levels. °· Focal neuropathy. This type of nerve damage affects one area of the body, such as an arm, a leg, or the face. The injury may involve one nerve or a small group of nerves. Focal neuropathy can be painful and unpredictable, and occurs most often in older adults with diabetes. This often develops suddenly, but usually improves over time  and does not cause long-term problems. °· Proximal neuropathy. This type of nerve damage affects the nerves of the thighs, hips, buttocks, or legs. It causes severe pain, weakness, and muscle death (atrophy), usually in the thigh muscles. It is more common among older men and people who have type 2 diabetes. The length of recovery time may vary. °What are the causes? °Peripheral, autonomic, and focal neuropathies are caused by diabetes that is not well controlled with treatment. The cause of proximal neuropathy is not known, but it may be caused by inflammation related to uncontrolled blood glucose levels. °What are the signs or symptoms? °Peripheral neuropathy °Peripheral neuropathy develops slowly over time. When the nerves of the feet and legs no longer work, you may experience: °· Burning, stabbing, or aching pain in the legs or feet. °· Pain or cramping in the   legs or feet. °· Loss of feeling (numbness) and inability to feel pressure or pain in the feet. This can lead to: °? Thick calluses or sores on areas of constant pressure. °? Ulcers. °? Reduced ability to feel temperature changes. °· Foot deformities. °· Muscle weakness. °· Loss of balance or coordination. °Autonomic neuropathy °The symptoms of autonomic neuropathy vary depending on which nerves are affected. Symptoms may include: °· Problems with digestion, such as: °? Nausea or vomiting. °? Poor appetite. °? Bloating. °? Diarrhea or constipation. °? Trouble swallowing. °? Losing weight without trying to. °· Problems with the heart, blood and lungs, such as: °? Dizziness, especially when standing up. °? Fainting. °? Shortness of breath. °? Irregular heartbeat. °· Bladder problems, such as: °? Trouble starting or stopping urination. °? Leaking urine. °? Trouble emptying the bladder. °? Urinary tract infections (UTIs). °· Problems with other body functions, such as: °? Sweat. You may sweat too much or too little. °? Temperature. You might get hot easily.  Or, you might feel cold more than usual. °? Sexual function. Men may not be able to get or maintain an erection. Women may have vaginal dryness and difficulty with arousal. °Focal neuropathy °Symptoms affect only one area of the body. Common symptoms include: °· Numbness. °· Tingling. °· Burning pain. °· Prickling feeling. °· Very sensitive skin. °· Weakness. °· Inability to move (paralysis). °· Muscle twitching. °· Muscles getting smaller (wasting). °· Poor coordination. °· Double or blurred vision. °Proximal neuropathy °· Sudden, severe pain in the hip, thigh, or buttocks. Pain may spread from the back into the legs (sciatica). °· Pain and numbness in the arms and legs. °· Tingling. °· Loss of bladder control or bowel control. °· Weakness and wasting of thigh muscles. °· Difficulty getting up from a seated position. °· Abdominal swelling. °· Unexplained weight loss. °How is this diagnosed? °Diagnosis usually involves reviewing your medical history and any symptoms you have. Diagnosis varies depending on the type of neuropathy your health care provider suspects. °Peripheral neuropathy °Your health care provider will check areas that are affected by your nervous system (neurologic exam), such as your reflexes, how you move, and what you can feel. You may have other tests, such as: °· Blood tests. °· Removal and examination of fluid that surrounds the spinal cord (lumbar puncture). °· CT scan. °· MRI. °· A test to check the nerves that control muscles (electromyogram, EMG). °· Tests of how quickly messages pass through your nerves (nerve conduction velocity tests). °· Removal of a small piece of nerve to be examined under a microscope (biopsy). °Autonomic neuropathy °You may have tests, such as: °· Tests to measure your blood pressure and heart rate. This may include monitoring you while you are safely secured to an exam table that moves you from a lying position to an upright position (table tilt test). °· Breathing  tests to check your lungs. °· Tests to check how food moves through the digestive system (gastric emptying tests). °· Blood, sweat, or urine tests. °· Ultrasound of your bladder. °· Spinal fluid tests. °Focal neuropathy °This condition may be diagnosed with: °· A neurologic exam. °· CT scan. °· MRI. °· EMG. °· Nerve conduction velocity tests. °Proximal neuropathy °There is no test to diagnose this type of neuropathy. You may have tests to rule out other possible causes of this type of neuropathy. Tests may include: °· X-rays of your spine and lumbar region. °· Lumbar puncture. °· MRI. °How is this treated? °The   goal of treatment is to keep nerve damage from getting worse. The most important part of treatment is keeping your blood glucose level and your A1C level within your target range by following your diabetes management plan. Over time, maintaining lower blood glucose levels helps lessen symptoms. In some cases, you may need prescription pain medicine. °Follow these instructions at home: ° °Lifestyle ° °· Do not use any products that contain nicotine or tobacco, such as cigarettes and e-cigarettes. If you need help quitting, ask your health care provider. °· Be physically active every day. Include strength training and balance exercises. °· Follow a healthy meal plan. °· Work with your health care provider to manage your blood pressure. °General instructions °· Follow your diabetes management plan as directed. °? Check your blood glucose levels as directed by your health care provider. °? Keep your blood glucose in your target range as directed by your health care provider. °? Have your A1C level checked at least two times a year, or as often as told by your health care provider. °· Take over the counter and prescription medicines only as told by your health care provider. This includes insulin and diabetes medicine. °· Do not drive or use heavy machinery while taking prescription pain medicines. °· Check your  skin and feet every day for cuts, bruises, redness, blisters, or sores. °· Keep all follow up visits as told by your health care provider. This is important. °Contact a health care provider if: °· You have burning, stabbing, or aching pain in your legs or feet. °· You are unable to feel pressure or pain in your feet. °· You develop problems with digestion, such as: °? Nausea. °? Vomiting. °? Bloating. °? Constipation. °? Diarrhea. °? Abdominal pain. °· You have difficulty with urination, such as inability: °? To control when you urinate (incontinence). °? To completely empty the bladder (retention). °· You have palpitations. °· You feel dizzy, weak, or faint when you stand up. °Get help right away if: °· You cannot urinate. °· You have sudden weakness or loss of coordination. °· You have trouble speaking. °· You have pain or pressure in your chest. °· You have an irregular heart beat. °· You have sudden inability to move a part of your body. °Summary °· Diabetic neuropathy refers to nerve damage that is caused by diabetes. It can affect nerves throughout the entire body, causing numbness and pain in the arms, legs, digestive tract, heart, and other body systems. °· Keep your blood glucose level and your blood pressure in your target range, as directed by your health care provider. This can help prevent neuropathy from getting worse. °· Check your skin and feet every day for cuts, bruises, redness, blisters, or sores. °· Do not use any products that contain nicotine or tobacco, such as cigarettes and e-cigarettes. If you need help quitting, ask your health care provider. °This information is not intended to replace advice given to you by your health care provider. Make sure you discuss any questions you have with your health care provider. °Document Released: 08/13/2001 Document Revised: 07/17/2017 Document Reviewed: 07/09/2016 °Elsevier Interactive Patient Education © 2019 [employer] Inc. ° °

## 2018-06-24 ENCOUNTER — Encounter: Payer: Self-pay | Admitting: Internal Medicine

## 2018-06-24 ENCOUNTER — Ambulatory Visit (INDEPENDENT_AMBULATORY_CARE_PROVIDER_SITE_OTHER): Payer: Medicare Other | Admitting: Internal Medicine

## 2018-06-24 VITALS — BP 142/64 | HR 82 | Temp 98.3°F | Ht 63.0 in | Wt 256.8 lb

## 2018-06-24 DIAGNOSIS — Z794 Long term (current) use of insulin: Secondary | ICD-10-CM | POA: Diagnosis not present

## 2018-06-24 DIAGNOSIS — R300 Dysuria: Secondary | ICD-10-CM | POA: Diagnosis not present

## 2018-06-24 DIAGNOSIS — M67919 Unspecified disorder of synovium and tendon, unspecified shoulder: Secondary | ICD-10-CM | POA: Insufficient documentation

## 2018-06-24 DIAGNOSIS — T148XXA Other injury of unspecified body region, initial encounter: Secondary | ICD-10-CM

## 2018-06-24 DIAGNOSIS — E1165 Type 2 diabetes mellitus with hyperglycemia: Secondary | ICD-10-CM

## 2018-06-24 DIAGNOSIS — N939 Abnormal uterine and vaginal bleeding, unspecified: Secondary | ICD-10-CM

## 2018-06-24 DIAGNOSIS — N183 Chronic kidney disease, stage 3 unspecified: Secondary | ICD-10-CM

## 2018-06-24 DIAGNOSIS — E1122 Type 2 diabetes mellitus with diabetic chronic kidney disease: Secondary | ICD-10-CM | POA: Diagnosis not present

## 2018-06-24 DIAGNOSIS — N950 Postmenopausal bleeding: Secondary | ICD-10-CM

## 2018-06-24 DIAGNOSIS — L0291 Cutaneous abscess, unspecified: Secondary | ICD-10-CM

## 2018-06-24 DIAGNOSIS — I10 Essential (primary) hypertension: Secondary | ICD-10-CM

## 2018-06-24 DIAGNOSIS — M67911 Unspecified disorder of synovium and tendon, right shoulder: Secondary | ICD-10-CM

## 2018-06-24 HISTORY — DX: Unspecified disorder of synovium and tendon, unspecified shoulder: M67.919

## 2018-06-24 LAB — CBC WITH DIFFERENTIAL/PLATELET
Basophils Absolute: 0.1 10*3/uL (ref 0.0–0.1)
Basophils Relative: 1.3 % (ref 0.0–3.0)
Eosinophils Absolute: 0.1 10*3/uL (ref 0.0–0.7)
Eosinophils Relative: 1.7 % (ref 0.0–5.0)
HCT: 39.2 % (ref 36.0–46.0)
Hemoglobin: 12.8 g/dL (ref 12.0–15.0)
Lymphocytes Relative: 53.7 % — ABNORMAL HIGH (ref 12.0–46.0)
Lymphs Abs: 4.3 10*3/uL — ABNORMAL HIGH (ref 0.7–4.0)
MCHC: 32.6 g/dL (ref 30.0–36.0)
MCV: 90.5 fl (ref 78.0–100.0)
MONOS PCT: 8.2 % (ref 3.0–12.0)
Monocytes Absolute: 0.7 10*3/uL (ref 0.1–1.0)
Neutro Abs: 2.8 10*3/uL (ref 1.4–7.7)
Neutrophils Relative %: 35.1 % — ABNORMAL LOW (ref 43.0–77.0)
Platelets: 288 10*3/uL (ref 150.0–400.0)
RBC: 4.33 Mil/uL (ref 3.87–5.11)
RDW: 13.8 % (ref 11.5–15.5)
WBC: 8.1 10*3/uL (ref 4.0–10.5)

## 2018-06-24 LAB — COMPREHENSIVE METABOLIC PANEL
ALT: 21 U/L (ref 0–35)
AST: 19 U/L (ref 0–37)
Albumin: 4.2 g/dL (ref 3.5–5.2)
Alkaline Phosphatase: 51 U/L (ref 39–117)
BUN: 16 mg/dL (ref 6–23)
CHLORIDE: 97 meq/L (ref 96–112)
CO2: 32 mEq/L (ref 19–32)
Calcium: 10.7 mg/dL — ABNORMAL HIGH (ref 8.4–10.5)
Creatinine, Ser: 1.11 mg/dL (ref 0.40–1.20)
GFR: 60.91 mL/min (ref >60.00)
Glucose, Bld: 229 mg/dL — ABNORMAL HIGH (ref 70–99)
Potassium: 4.6 mEq/L (ref 3.5–5.1)
Sodium: 136 mEq/L (ref 135–145)
Total Bilirubin: 0.8 mg/dL (ref 0.2–1.2)
Total Protein: 6.9 g/dL (ref 6.0–8.3)

## 2018-06-24 LAB — HEMOGLOBIN A1C: Hgb A1c MFr Bld: 9.8 % — ABNORMAL HIGH (ref 4.6–6.5)

## 2018-06-24 MED ORDER — DOXYCYCLINE HYCLATE 100 MG PO TABS: 100.0000 mg | ORAL_TABLET | Freq: Two times a day (BID) | ORAL | 0 refills | Status: DC

## 2018-06-24 MED ORDER — MUPIROCIN 2 % EX OINT: 1.0000 "application " | TOPICAL_OINTMENT | Freq: Two times a day (BID) | CUTANEOUS | 0 refills | Status: DC

## 2018-06-24 NOTE — Progress Notes (Signed)
Pre visit review using our clinic review tool, if applicable. No additional management support is needed unless otherwise documented below in the visit note. 

## 2018-06-24 NOTE — Patient Instructions (Addendum)
Premier protein shake Cinnamon capsules 500 1-2 pills per day   Januvia samples 1/2 pill call back if problems    Diabetes Mellitus and Nutrition, Adult When you have diabetes (diabetes mellitus), it is very important to have healthy eating habits because your blood sugar (glucose) levels are greatly affected by what you eat and drink. Eating healthy foods in the appropriate amounts, at about the same times every day, can help you:  Control your blood glucose.  Lower your risk of heart disease.  Improve your blood pressure.  Reach or maintain a healthy weight. Every person with diabetes is different, and each person has different needs for a meal plan. Your health care provider may recommend that you work with a diet and nutrition specialist (dietitian) to make a meal plan that is best for you. Your meal plan may vary depending on factors such as:  The calories you need.  The medicines you take.  Your weight.  Your blood glucose, blood pressure, and cholesterol levels.  Your activity level.  Other health conditions you have, such as heart or kidney disease. How do carbohydrates affect me? Carbohydrates, also called carbs, affect your blood glucose level more than any other type of food. Eating carbs naturally raises the amount of glucose in your blood. Carb counting is a method for keeping track of how many carbs you eat. Counting carbs is important to keep your blood glucose at a healthy level, especially if you use insulin or take certain oral diabetes medicines. It is important to know how many carbs you can safely have in each meal. This is different for every person. Your dietitian can help you calculate how many carbs you should have at each meal and for each snack. Foods that contain carbs include:  Bread, cereal, rice, pasta, and crackers.  Potatoes and corn.  Peas, beans, and lentils.  Milk and yogurt.  Fruit and juice.  Desserts, such as cakes, cookies, ice  cream, and candy. How does alcohol affect me? Alcohol can cause a sudden decrease in blood glucose (hypoglycemia), especially if you use insulin or take certain oral diabetes medicines. Hypoglycemia can be a life-threatening condition. Symptoms of hypoglycemia (sleepiness, dizziness, and confusion) are similar to symptoms of having too much alcohol. If your health care provider says that alcohol is safe for you, follow these guidelines:  Limit alcohol intake to no more than 1 drink per day for nonpregnant women and 2 drinks per day for men. One drink equals 12 oz of beer, 5 oz of wine, or 1 oz of hard liquor.  Do not drink on an empty stomach.  Keep yourself hydrated with water, diet soda, or unsweetened iced tea.  Keep in mind that regular soda, juice, and other mixers may contain a lot of sugar and must be counted as carbs. What are tips for following this plan?  Reading food labels  Start by checking the serving size on the "Nutrition Facts" label of packaged foods and drinks. The amount of calories, carbs, fats, and other nutrients listed on the label is based on one serving of the item. Many items contain more than one serving per package.  Check the total grams (g) of carbs in one serving. You can calculate the number of servings of carbs in one serving by dividing the total carbs by 15. For example, if a food has 30 g of total carbs, it would be equal to 2 servings of carbs.  Check the number of grams (g) of  saturated and trans fats in one serving. Choose foods that have low or no amount of these fats.  Check the number of milligrams (mg) of salt (sodium) in one serving. Most people should limit total sodium intake to less than 2,300 mg per day.  Always check the nutrition information of foods labeled as "low-fat" or "nonfat". These foods may be higher in added sugar or refined carbs and should be avoided.  Talk to your dietitian to identify your daily goals for nutrients listed on  the label. Shopping  Avoid buying canned, premade, or processed foods. These foods tend to be high in fat, sodium, and added sugar.  Shop around the outside edge of the grocery store. This includes fresh fruits and vegetables, bulk grains, fresh meats, and fresh dairy. Cooking  Use low-heat cooking methods, such as baking, instead of high-heat cooking methods like deep frying.  Cook using healthy oils, such as olive, canola, or sunflower oil.  Avoid cooking with butter, cream, or high-fat meats. Meal planning  Eat meals and snacks regularly, preferably at the same times every day. Avoid going long periods of time without eating.  Eat foods high in fiber, such as fresh fruits, vegetables, beans, and whole grains. Talk to your dietitian about how many servings of carbs you can eat at each meal.  Eat 4-6 ounces (oz) of lean protein each day, such as lean meat, chicken, fish, eggs, or tofu. One oz of lean protein is equal to: ? 1 oz of meat, chicken, or fish. ? 1 egg. ?  cup of tofu.  Eat some foods each day that contain healthy fats, such as avocado, nuts, seeds, and fish. Lifestyle  Check your blood glucose regularly.  Exercise regularly as told by your health care provider. This may include: ? 150 minutes of moderate-intensity or vigorous-intensity exercise each week. This could be brisk walking, biking, or water aerobics. ? Stretching and doing strength exercises, such as yoga or weightlifting, at least 2 times a week.  Take medicines as told by your health care provider.  Do not use any products that contain nicotine or tobacco, such as cigarettes and e-cigarettes. If you need help quitting, ask your health care provider.  Work with a Social worker or diabetes educator to identify strategies to manage stress and any emotional and social challenges. Questions to ask a health care provider  Do I need to meet with a diabetes educator?  Do I need to meet with a dietitian?  What  number can I call if I have questions?  When are the best times to check my blood glucose? Where to find more information:  American Diabetes Association: diabetes.org  Academy of Nutrition and Dietetics: www.eatright.[employer] of Diabetes and Digestive and Kidney Diseases (NIH): [employer] Summary  A healthy meal plan will help you control your blood glucose and maintain a healthy lifestyle.  Working with a diet and nutrition specialist (dietitian) can help you make a meal plan that is best for you.  Keep in mind that carbohydrates (carbs) and alcohol have immediate effects on your blood glucose levels. It is important to count carbs and to use alcohol carefully. This information is not intended to replace advice given to you by your health care provider. Make sure you discuss any questions you have with your health care provider. Document Released: 03/01/2005 Document Revised: 01/02/2017 Document Reviewed: 07/09/2016 Elsevier Interactive Patient Education  2019 [employer].

## 2018-06-24 NOTE — Progress Notes (Signed)
Chief Complaint  Patient presents with  . Follow-up   F/u  1. DM 2 last A1C 10.4  Given samples Januvia 100 mg x 14 (1 box) to cut in 1/2 to add on Novolin R and Novolin N. Pt never tried samples due to c/w side effects Novolin N taking 25 am and 20 pm with novololin R 15 tid but gave SSI to do based on cbgs  She also is c/w low cbgs but lowest has been 80s she stays awake at night due to c/w dropping sugar and not waking up  She declines to see endocrine for now  2. Still c/o cough CXR 01/20/18 stable mild vascular congestion as of now she is not taking torsemide 20 mg bid. She has f/u cardiology 08/2018  3. PAD she has f/u with vascular 07/2018 4. C/o vaginal bleeding and saw [facility] 05/28/18 pap negative and offered hysterectomy pt nervous about surgery so Dr. Leafy Ro rec D&C repeat 5. C/o neck and right shoulder abnormal CT neck and Xray shoulder  -pain stopped tramadol PT did help with her shoulder she declines speciality referral for now  6. C/o increased urinary frequency at 4 am and reduce urinary freq otherwise and burning w urination. Had macrobid in recent past for UTI c/w  7. C/o left forearm cyst improving with heat but still present and c/o abrasion to ring index finger and finger turned dark but she has been working on it and improved ? If could have been burn/blister  8. HTN sl elevated today on clonidine 0.2 bid, benicar 40 mg qd, spironlactone 25 qd, she is not taking toresemide 20 mg bid     Review of Systems  Constitutional: Positive for weight loss.       Lost 11 lbs    HENT: Negative for hearing loss.   Eyes: Negative for blurred vision.  Respiratory: Negative for shortness of breath.   Cardiovascular: Negative for chest pain.  Gastrointestinal: Negative for abdominal pain.  Genitourinary: Positive for frequency.       +vaginal bleeding    Musculoskeletal: Positive for joint pain and neck pain. Negative for falls.  Skin:       +skin lesions left forearm and  right index finger    Neurological: Negative for headaches.  Psychiatric/Behavioral: Positive for depression. The patient has insomnia.    Past Medical History:  Diagnosis Date  . Arthritis   . Chickenpox   . Diabetes mellitus without complication ([place])    one elevated reading/ no treatment  . Diverticulitis   . GI bleed   . High cholesterol   . History of blood transfusion   . Hypertension   . Renal insufficiency    Past Surgical History:  Procedure Laterality Date  . APPENDECTOMY    . ECTOPIC PREGNANCY SURGERY    . EYE SURGERY     bilateral cataracts  . gallbladder     . HYSTEROSCOPY W/D&C N/A 10/26/2016   Procedure: DILATATION AND CURETTAGE /HYSTEROSCOPY;  Surgeon: Benjaman Kindler, MD;  Location: [facility] ORS;  Service: Gynecology;  Laterality: N/A;  . THYROIDECTOMY, PARTIAL     Family History  Problem Relation Age of Onset  . Diabetes Mother   . Hypertension Mother   . Stroke Mother   . Diabetes Other   . Diabetes Sister   . Heart disease Sister    Social History   Socioeconomic History  . Marital status: Widowed    Spouse name: Not on file  . Number of children: Not  on file  . Years of education: Not on file  . Highest education level: Not on file  Occupational History  . Not on file  Social Needs  . Financial resource strain: Not on file  . Food insecurity:    Worry: Not on file    Inability: Not on file  . Transportation needs:    Medical: Not on file    Non-medical: Not on file  Tobacco Use  . Smoking status: Never Smoker  . Smokeless tobacco: Never Used  Substance and Sexual Activity  . Alcohol use: No  . Drug use: No  . Sexual activity: Not on file  Lifestyle  . Physical activity:    Days per week: Not on file    Minutes per session: Not on file  . Stress: Not on file  Relationships  . Social connections:    Talks on phone: Not on file    Gets together: Not on file    Attends religious service: Not on file    Active member of club or  organization: Not on file    Attends meetings of clubs or organizations: Not on file    Relationship status: Not on file  . Intimate partner violence:    Fear of current or ex partner: Not on file    Emotionally abused: Not on file    Physically abused: Not on file    Forced sexual activity: Not on file  Other Topics Concern  . Not on file  Social History Narrative   Lives alone    From [place]   Widowed    Current Meds  Medication Sig  . B-D INS SYRINGE 0.5CC/30GX1/2" 30G X 1/2" 0.5 ML MISC USE AS DIRECTED  . blood glucose meter kit and supplies KIT Accu chek, Dx code E11.65, check 3 times daily  . cloNIDine (CATAPRES) 0.2 MG tablet TAKE 1 TABLET (0.2 MG TOTAL) BY MOUTH 2 (TWO) TIMES DAILY. (Patient taking differently: Take 0.2 mg by mouth 3 (three) times daily. )  . glucose blood ([employer] BLOOD GLUCOSE TEST) test strip USE TO CHECK CBG'S 3 TIMES DAILY. (Patient taking differently: USE TO CHECK CBG'S 3 TIMES DAILY. Guid test strips)  . insulin regular (NOVOLIN R) 100 units/mL injection INJECT 15 UNITS TOTAL INTO THE SKIN 3 (THREE) TIMES DAILY BEFORE MEALS. (Patient taking differently: INJECT 15 UNITS TOTAL INTO THE SKIN 3 (THREE) TIMES DAILY BEFORE MEALS. 4 Vials for 90 day supply)  . Multiple Vitamin (MULTIVITAMIN WITH MINERALS) TABS tablet Take 1 tablet by mouth daily.  Marland Kitchen NOVOLIN N 100 UNIT/ML injection INJECT 25 UNITS IN THE MORNING AND 15-20 UNITS IN THE EVENING BEFORE A MEAL  . olmesartan (BENICAR) 40 MG tablet Take 1 tablet (40 mg total) by mouth daily.  . progesterone (PROMETRIUM) 200 MG capsule Take by mouth.  . rosuvastatin (CRESTOR) 10 MG tablet TAKE 1 TABLET (10 MG TOTAL) BY MOUTH DAILY.  Marland Kitchen spironolactone (ALDACTONE) 25 MG tablet Take 1 tablet (25 mg total) by mouth daily.   Allergies  Allergen Reactions  . Penicillins Rash    Has patient had a PCN reaction causing immediate rash, facial/tongue/throat swelling, SOB or lightheadedness with hypotension: Yes Has patient had a PCN  reaction causing severe rash involving mucus membranes or skin necrosis: No Has patient had a PCN reaction that required hospitalization No Has patient had a PCN reaction occurring within the last 10 years: Yes If all of the above answers are "NO", then may proceed with Cephalosporin use.  No results found for this or any previous visit (from the past 2160 hour(s)). Objective  Body mass index is 45.49 kg/m. Wt Readings from Last 3 Encounters:  06/24/18 256 lb 12.8 oz (116.5 kg)  03/25/18 267 lb 12.8 oz (121.5 kg)  02/12/18 267 lb 12.8 oz (121.5 kg)   Temp Readings from Last 3 Encounters:  06/24/18 98.3 F (36.8 C) (Oral)  03/25/18 98.7 F (37.1 C) (Oral)  02/12/18 99.2 F (37.3 C) (Oral)   BP Readings from Last 3 Encounters:  06/24/18 (!) 142/64  03/25/18 140/60  02/12/18 (!) 160/78   Pulse Readings from Last 3 Encounters:  06/24/18 82  03/25/18 76  02/12/18 77    Physical Exam Vitals signs and nursing note reviewed.  Constitutional:      Appearance: Normal appearance. She is well-developed. She is morbidly obese.  HENT:     Head: Normocephalic and atraumatic.     Nose: Nose normal.     Mouth/Throat:     Mouth: Mucous membranes are moist.     Pharynx: Oropharynx is clear.  Eyes:     Conjunctiva/sclera: Conjunctivae normal.     Pupils: Pupils are equal, round, and reactive to light.  Cardiovascular:     Rate and Rhythm: Normal rate and regular rhythm.     Heart sounds: Normal heart sounds.  Pulmonary:     Effort: Pulmonary effort is normal.     Breath sounds: Normal breath sounds.  Skin:    General: Skin is warm and dry.       Neurological:     General: No focal deficit present.     Mental Status: She is alert and oriented to person, place, and time.     Gait: Gait normal.  Psychiatric:        Attention and Perception: Attention and perception normal.        Mood and Affect: Mood and affect normal.        Speech: Speech normal.        Behavior:  Behavior normal. Behavior is cooperative.        Thought Content: Thought content normal.        Cognition and Memory: Cognition and memory normal.        Judgment: Judgment normal.     Assessment   1. DM 2 uncontrolled with CKD 3  2. Abrasion right index finger healing and cyst vs abscess to left forearm  3. Abnormal vaginal bleeding postmenopausal 4. Dysuria and increased freq  5. HTN sl elevated today, PAD, chronic diastolic CHF 6. Chronic neck pain abnormal CT 12/12/17 bulging disc and protrusion right sided with right neural foraminal stenosis and C7 nerve impingement, mild spondylosis and right shoulder pain c/w rotator cuff etiology  7. HM Plan  1. On novolin N 25 in am and 20 pm, novolin R 15 tid pt will not change medication reg. Due to c/w side effects and low cbg  She will try januvia 100 mg samples and cut in 1/2 =50 mg if she is nervous advised she can also cut in 1/4 to see how tolerates samples Check labs today cMET, CBC A1C today On ARB and statin  She would like DM shoes f/u with podiatry will CC them  Will disc DM eye exam at f/u  Encouraged pt to consider again endocrine referral in future but she declines for now diabetic reg. Needs to be changed to get A1C goal <8.0 for age pt declines to make changes for now  2. Doxycycline and bactroban and warm compress left forearm  3. F/u OB/GYN [place] considering repeat D&C pt declines hysterectomy for now with dr. Leafy Ro  4. UA and culture today  5. Cont meds  clonidine 0.2 bid, benicar 40 mg qd, spironlactone 25 qd, she is not taking toresemide 20 mg bid  F/u with vascular and cards upcoming with [facility] [place] pending  6. Pt wants to hold referral for now PT helped her right shoulder  Consider MRI cervical in future  7.  Flu shot utd  Check on other vaccines (I.e Tdap, prevnar, pna 23, shingrix)   mammo had 08/17/17 normal [facility] per pt has upcoming sch Pap had 05/28/18 negative [facility] DEXA pt declines for now  Colonoscopy   Dr. Allen Norris in [facility] -09/10/14 diverticulosis sessile polyp Consider check hep Cthough not in age window CT chest never smoker   02/03/18 thyroid bx benign bx left middle and lower thyroid  Saw renal 03/17/18 CKD 3 2/2 diabetes BP elevated 139/68,142/84 considering ACEI/ARB f/u in 6 months Dr. Candiss Norse  Provider: Dr. Olivia Mackie McLean-Scocuzza-Internal Medicine

## 2018-06-24 NOTE — Progress Notes (Signed)
No imms in [place]

## 2018-06-25 LAB — URINALYSIS, ROUTINE W REFLEX MICROSCOPIC
Bacteria, UA: NONE SEEN /HPF
Bilirubin Urine: NEGATIVE
Glucose, UA: NEGATIVE
HYALINE CAST: NONE SEEN /LPF
Hgb urine dipstick: NEGATIVE
Ketones, ur: NEGATIVE
Nitrite: NEGATIVE
Protein, ur: NEGATIVE
Specific Gravity, Urine: 1.01 (ref 1.001–1.03)
Squamous Epithelial / HPF: NONE SEEN /HPF (ref <=5)
WBC, UA: NONE SEEN /HPF (ref 0–5)
pH: 8 (ref 5.0–8.0)

## 2018-06-25 LAB — URINE CULTURE
MICRO NUMBER:: 21598
SPECIMEN QUALITY:: ADEQUATE

## 2018-06-26 ENCOUNTER — Telehealth: Payer: Self-pay

## 2018-06-26 NOTE — Telephone Encounter (Signed)
Brock call back please   [place]

## 2018-06-26 NOTE — Telephone Encounter (Signed)
Copied from [place] [id number]. Topic: General - Other >> Jun 26, 2018 10:34 AM Carolyn Stare wrote:  Would like a call back about her lab results

## 2018-07-02 ENCOUNTER — Encounter
Admission: RE | Admit: 2018-07-02 | Discharge: 2018-07-02 | Disposition: A | Discharge status: Home / Self Care | Payer: Medicare Other | Source: Ambulatory Visit | Attending: Obstetrics and Gynecology | Admitting: Obstetrics and Gynecology

## 2018-07-02 ENCOUNTER — Other Ambulatory Visit: Payer: Self-pay

## 2018-07-02 DIAGNOSIS — Z01818 Encounter for other preprocedural examination: Secondary | ICD-10-CM | POA: Insufficient documentation

## 2018-07-02 LAB — BASIC METABOLIC PANEL
Anion gap: 9 (ref 5–15)
BUN: 25 mg/dL — ABNORMAL HIGH (ref 8–23)
CALCIUM: 9.9 mg/dL (ref 8.9–10.3)
CO2: 29 mmol/L (ref 22–32)
Chloride: 98 mmol/L (ref 98–111)
Creatinine, Ser: 1.37 mg/dL — ABNORMAL HIGH (ref 0.44–1.00)
GFR calc Af Amer: 42 mL/min — ABNORMAL LOW (ref >60)
GFR calc non Af Amer: 37 mL/min — ABNORMAL LOW (ref >60)
Glucose, Bld: 146 mg/dL — ABNORMAL HIGH (ref 70–99)
Potassium: 4.7 mmol/L (ref 3.5–5.1)
SODIUM: 136 mmol/L (ref 135–145)

## 2018-07-02 NOTE — Patient Instructions (Addendum)
Your procedure is scheduled on: Monday, July 07, 2018 Report to Day Surgery on the 2nd floor of the [facility]. To find out your arrival time, please call [phone] between 1PM - 3PM on:  REMEMBER: Instructions that are not followed completely may result in serious medical risk, up to and including death; or upon the discretion of your surgeon and anesthesiologist your surgery may need to be rescheduled.  Do not eat food after midnight the night before surgery.  No gum chewing, lozengers or hard candies.  You may however, drink water up to 2 hours before you are scheduled to arrive for your surgery. Do not drink anything within 2 hours of the start of your surgery.  No Alcohol for 24 hours before or after surgery.  No Smoking including e-cigarettes for 24 hours prior to surgery.  No chewable tobacco products for at least 6 hours prior to surgery.  No nicotine patches on the day of surgery.  On the morning of surgery brush your teeth with toothpaste and water, you may rinse your mouth with mouthwash if you wish. Do not swallow any toothpaste or mouthwash.  Notify your doctor if there is any change in your medical condition (cold, fever, infection).  Do not wear jewelry, make-up, hairpins, clips or nail polish.  Do not wear lotions, powders, or perfumes.   Do not shave 48 hours prior to surgery.   Contacts and dentures may not be worn into surgery.  Do not bring valuables to the hospital, including drivers license, insurance or credit cards.  [facility] is not responsible for any belongings or valuables.   TAKE THESE MEDICATIONS THE MORNING OF SURGERY:  1.  CLONIDINE.   Take 1/2 of usual insulin dose the night before surgery and none on the morning of surgery. ONLY TAKE 10 UNITS OF NOVOLIN N THE NIGHT BEFORE SURGERY AND NO INSULIN ON THE DAY OF SURGERY. WE WILL CHECK YOUR BLOOD SUGAR WHEN YOU ARRIVE TO [place]!  Stop Anti-inflammatories (NSAIDS) such as  Advil, Aleve, Ibuprofen, Motrin, Naproxen, Naprosyn and Aspirin based products such as Excedrin, Goodys Powder, BC Powder. (May take Tylenol or Acetaminophen if needed.)  NOW!  Stop ANY OVER THE COUNTER supplements until after surgery.  Wear comfortable clothing (specific to your surgery type) to the hospital.  If you are being discharged the day of surgery, you will not be allowed to drive home. You will need a responsible adult to drive you home and stay with you that night.   If you are taking public transportation, you will need to have a responsible adult with you. Please confirm with your physician that it is acceptable to use public transportation.   Please call [phone] if you have any questions about these instructions.

## 2018-07-02 NOTE — Pre-Procedure Instructions (Signed)
EKG and DM history reviewed by Dr. Rosey Bath (anesthesia). Ok to proceed to surgery at this time.

## 2018-07-03 ENCOUNTER — Ambulatory Visit: Payer: Medicare Other | Admitting: Orthotics

## 2018-07-04 NOTE — H&P (Signed)
Alexis Farmer is a 78 y.o. female here for Pre Op Consulting (discuss hysterectomy) . HPI:  Pt presents for a preoperative visit to schedule a D&C, hysteroscopy.  She has a hx of: PMB with a prior D&C, hysteroscopy for PMB with benign polyps  Workup has included: EMBX - scant but benign Pap smear- neg 2019  She has been on progesterone for 2 weeks: makes her feel bad and she stopped  Past Medical History:  has a past medical history of Anxiety, Chronic kidney disease, Diabetes mellitus type 2, uncomplicated (CMS-HCC), and Hypertension.  Past Surgical History:  has a past surgical history that includes Lobectomy Partial Thyroid (1980); Cholecystectomy; Appendectomy; Oophorectomy (Right); Dilation and curettage of uterus; and Hysteroscopy. Family History: family history includes Clotting disorder in her father; Diabetes in her sister, sister, and sister; Diabetes type II in her mother; Myocardial Infarction (Heart attack) in her mother and sister; Stroke in her mother. Social History:  reports that she has never smoked. She has never used smokeless tobacco. She reports that she does not drink alcohol or use drugs. OB/GYN History:          OB History    Gravida  2   Para  1   Term  1   Preterm      AB  1   Living  1     SAB  0   TAB      Ectopic  1   Molar      Multiple      Live Births             Allergies: is allergic to penicillin. Medications:  Current Outpatient Medications:  .  ALPRAZolam (XANAX) 0.5 MG tablet, TAKE 1 TABLET BY MOUTH NIGHTLY AS NEEDED FOR SLEEP (Patient not taking: Reported on 06/17/2017), Disp: 30 tablet, Rfl: 2 .  aspirin 81 MG chewable tablet, Take 81 mg by mouth once daily., Disp: , Rfl:  .  cloNIDine HCl (CATAPRES) 0.2 MG tablet, Take 0.2 mg by mouth 3 (three) times a day  , Disp: , Rfl:  .  insulin NPH (HUMULIN N,NOVOLIN N) 100 unit/mL injection, Inject 20-25 units two times daily with meals--25 units in AM and 20 units in  PM., Disp: 5 vial, Rfl: 5 .  insulin REGULAR (NOVOLIN R) 100 unit/mL injection, Inject 15 units three times daily with meals., Disp: 4 vial, Rfl: 5 .  metoprolol tartrate (LOPRESSOR) 50 MG tablet, Take 50 mg by mouth 2 (two) times daily., Disp: , Rfl:  .  multivitamin tablet, Take 1 tablet by mouth once daily. Reported on 08/10/2015 , Disp: , Rfl:  .  nitrofurantoin, macrocrystal-monohydrate, (MACROBID) 100 MG capsule, Take 100 mg by mouth 2 (two) times daily, Disp: , Rfl:  .  olmesartan (BENICAR) 40 MG tablet, Take 40 mg by mouth once daily  , Disp: , Rfl:  .  ONETOUCH ULTRA BLUE TEST STRIP test strip, USE 3 (THREE) TIMES DAILY. USE AS INSTRUCTED. ONE TOUCH DX: E11.9, Disp: 300 each, Rfl: 3 .  ONETOUCH ULTRA2 kit, Use as instructed., Disp: 1 each, Rfl: 0 .  polyethylene glycol (MIRALAX) powder, MIX 17 GRAMS OF POWDER IN 8 OUNCES OF WATER, DRINK ONCE A DAY (Patient not taking: Reported on 01/14/2018), Disp: 527 g, Rfl: 0 .  progesterone (PROMETRIUM) 200 MG capsule, Take 1 capsule (200 mg total) by mouth nightly, Disp: 30 capsule, Rfl: 3 .  PYRIDOXINE HCL (VITAMIN B-6 ORAL), Take 200 mg by mouth once daily. Reported on  08/10/2015 , Disp: , Rfl:  .  rosuvastatin (CRESTOR) 10 MG tablet, Take 10 mg by mouth once daily., Disp: , Rfl:  .  spironolactone (ALDACTONE) 25 MG tablet, Take 1 tablet (25 mg total) by mouth once daily., Disp: 90 tablet, Rfl: 3 .  TORsemide (DEMADEX) 20 MG tablet, Take 20 mg by mouth 2 (two) times daily  , Disp: , Rfl:  .  verapamil (VERELAN) 240 MG SR capsule, Take 240 mg by mouth once daily.  , Disp: , Rfl:   Review of Systems: No SOB, no palpitations or chest pain, no new lower extremity edema, no nausea or vomiting or bowel or bladder complaints. See HPI for gyn specific ROS.   Exam:    WDWN   female in NAD Body mass index is 47.3 kg/m.  General: Patient is well-groomed, well-nourished, appears stated age in no acute distress  HEENT: head is atraumatic and  normocephalic, trachea is midline, neck is supple with no palpable nodules  CV: Regular rhythm and normal heart rate, no murmur  Pulm: Clear to auscultation throughout lung fields with no wheezing, crackles, or rhonchi. No increased work of breathing  Abdomen: soft , no mass, non-tender, no rebound tenderness, no hepatomegaly  Pelvic: Deferred  Impression:   The encounter diagnosis was Post-menopausal bleeding.    Plan:   -  -Preoperative visit: D&C hysteroscopy for postmenopausal bleeding.  We discussed definitive surgery as an alternative, but no evidence of malignancy and not required. Risks are significant.    Risks of surgery were discussed with the patient including but not limited to: bleeding which may require transfusion; infection which may require antibiotics; injury to uterus or surrounding organs; intrauterine scarring which may impair future fertility; need for additional procedures including laparotomy or laparoscopy; and other postoperative/anesthesia complications. Written informed consent was obtained.  This is a scheduled same-day surgery. She will have a postop visit in 2 weeks to review operative findings and pathology.  Will need small speculum Hx of DMT2  Return for Postop check.  Sherrie George, MD

## 2018-07-07 ENCOUNTER — Other Ambulatory Visit: Payer: Self-pay

## 2018-07-07 ENCOUNTER — Encounter: Payer: Self-pay | Admitting: *Deleted

## 2018-07-07 ENCOUNTER — Ambulatory Visit
Admission: RE | Admit: 2018-07-07 | Discharge: 2018-07-07 | Disposition: A | Discharge status: Home / Self Care | Payer: Medicare Other | Attending: Obstetrics and Gynecology | Admitting: Obstetrics and Gynecology

## 2018-07-07 ENCOUNTER — Encounter
Admission: RE | Disposition: A | Discharge status: Home / Self Care | Payer: Self-pay | Source: Home / Self Care | Attending: Obstetrics and Gynecology

## 2018-07-07 ENCOUNTER — Ambulatory Visit: Payer: Medicare Other | Admitting: Anesthesiology

## 2018-07-07 DIAGNOSIS — Z79899 Other long term (current) drug therapy: Secondary | ICD-10-CM | POA: Insufficient documentation

## 2018-07-07 DIAGNOSIS — N950 Postmenopausal bleeding: Secondary | ICD-10-CM | POA: Diagnosis present

## 2018-07-07 DIAGNOSIS — I509 Heart failure, unspecified: Secondary | ICD-10-CM | POA: Insufficient documentation

## 2018-07-07 DIAGNOSIS — I130 Hypertensive heart and chronic kidney disease with heart failure and stage 1 through stage 4 chronic kidney disease, or unspecified chronic kidney disease: Secondary | ICD-10-CM | POA: Insufficient documentation

## 2018-07-07 DIAGNOSIS — Z880 Allergy status to penicillin: Secondary | ICD-10-CM | POA: Insufficient documentation

## 2018-07-07 DIAGNOSIS — Z7982 Long term (current) use of aspirin: Secondary | ICD-10-CM | POA: Insufficient documentation

## 2018-07-07 DIAGNOSIS — E7800 Pure hypercholesterolemia, unspecified: Secondary | ICD-10-CM | POA: Diagnosis not present

## 2018-07-07 DIAGNOSIS — Z79890 Hormone replacement therapy (postmenopausal): Secondary | ICD-10-CM | POA: Diagnosis not present

## 2018-07-07 DIAGNOSIS — N189 Chronic kidney disease, unspecified: Secondary | ICD-10-CM | POA: Diagnosis not present

## 2018-07-07 DIAGNOSIS — E1122 Type 2 diabetes mellitus with diabetic chronic kidney disease: Secondary | ICD-10-CM | POA: Diagnosis not present

## 2018-07-07 DIAGNOSIS — Z794 Long term (current) use of insulin: Secondary | ICD-10-CM | POA: Diagnosis not present

## 2018-07-07 DIAGNOSIS — E1151 Type 2 diabetes mellitus with diabetic peripheral angiopathy without gangrene: Secondary | ICD-10-CM | POA: Diagnosis not present

## 2018-07-07 DIAGNOSIS — F419 Anxiety disorder, unspecified: Secondary | ICD-10-CM | POA: Insufficient documentation

## 2018-07-07 HISTORY — PX: HYSTEROSCOPY WITH D & C: SHX1775

## 2018-07-07 LAB — BASIC METABOLIC PANEL
Anion gap: 9 (ref 5–15)
BUN: 23 mg/dL (ref 8–23)
CALCIUM: 9.4 mg/dL (ref 8.9–10.3)
CHLORIDE: 99 mmol/L (ref 98–111)
CO2: 27 mmol/L (ref 22–32)
Creatinine, Ser: 1.25 mg/dL — ABNORMAL HIGH (ref 0.44–1.00)
GFR calc Af Amer: 47 mL/min — ABNORMAL LOW (ref >60)
GFR calc non Af Amer: 41 mL/min — ABNORMAL LOW (ref >60)
Glucose, Bld: 273 mg/dL — ABNORMAL HIGH (ref 70–99)
Potassium: 4.8 mmol/L (ref 3.5–5.1)
Sodium: 135 mmol/L (ref 135–145)

## 2018-07-07 LAB — GLUCOSE, CAPILLARY
Glucose-Capillary: 257 mg/dL — ABNORMAL HIGH (ref 70–99)
Glucose-Capillary: 260 mg/dL — ABNORMAL HIGH (ref 70–99)

## 2018-07-07 LAB — CBC
HCT: 41.3 % (ref 36.0–46.0)
HEMOGLOBIN: 13.2 g/dL (ref 12.0–15.0)
MCH: 29.3 pg (ref 26.0–34.0)
MCHC: 32 g/dL (ref 30.0–36.0)
MCV: 91.8 fL (ref 80.0–100.0)
Platelets: 295 10*3/uL (ref 150–400)
RBC: 4.5 MIL/uL (ref 3.87–5.11)
RDW: 13.6 % (ref 11.5–15.5)
WBC: 6.8 10*3/uL (ref 4.0–10.5)
nRBC: 0 % (ref 0.0–0.2)

## 2018-07-07 LAB — TYPE AND SCREEN
ABO/RH(D): A POS
Antibody Screen: NEGATIVE

## 2018-07-07 SURGERY — DILATATION AND CURETTAGE /HYSTEROSCOPY
Anesthesia: General

## 2018-07-07 MED ORDER — ONDANSETRON HCL 4 MG/2ML IJ SOLN
INTRAMUSCULAR | Status: DC | PRN
  Administered 2018-07-07: 4 mg via INTRAVENOUS

## 2018-07-07 MED ORDER — LACTATED RINGERS IV SOLN
INTRAVENOUS | Status: DC | PRN
  Administered 2018-07-07: 11:00:00 via INTRAVENOUS

## 2018-07-07 MED ORDER — FENTANYL CITRATE (PF) 100 MCG/2ML IJ SOLN
25.0000 ug | INTRAMUSCULAR | Status: DC | PRN
  Administered 2018-07-07 (×4): 25 ug via INTRAVENOUS

## 2018-07-07 MED ORDER — SODIUM CHLORIDE 0.9 % IV SOLN
INTRAVENOUS | Status: DC
  Administered 2018-07-07: 75 mL/h via INTRAVENOUS

## 2018-07-07 MED ORDER — ONDANSETRON HCL 4 MG/2ML IJ SOLN: 4.0000 mg | Freq: Once | INTRAMUSCULAR | Status: DC | PRN

## 2018-07-07 MED ORDER — PROPOFOL 10 MG/ML IV BOLUS
INTRAVENOUS | Status: DC | PRN
  Administered 2018-07-07: 130 mg via INTRAVENOUS

## 2018-07-07 MED ORDER — FENTANYL CITRATE (PF) 100 MCG/2ML IJ SOLN
INTRAMUSCULAR | Status: AC
  Filled 2018-07-07: qty 2

## 2018-07-07 MED ORDER — FAMOTIDINE 20 MG PO TABS
ORAL_TABLET | ORAL | Status: AC
  Administered 2018-07-07: 20 mg via ORAL
  Filled 2018-07-07: qty 1

## 2018-07-07 MED ORDER — FAMOTIDINE 20 MG PO TABS
20.0000 mg | ORAL_TABLET | Freq: Once | ORAL | Status: AC
  Administered 2018-07-07: 20 mg via ORAL

## 2018-07-07 MED ORDER — LIDOCAINE HCL (CARDIAC) PF 100 MG/5ML IV SOSY
PREFILLED_SYRINGE | INTRAVENOUS | Status: DC | PRN
  Administered 2018-07-07: 100 mg via INTRAVENOUS

## 2018-07-07 MED ORDER — FENTANYL CITRATE (PF) 100 MCG/2ML IJ SOLN
INTRAMUSCULAR | Status: DC | PRN
  Administered 2018-07-07 (×2): 50 ug via INTRAVENOUS

## 2018-07-07 MED ORDER — SUCCINYLCHOLINE CHLORIDE 20 MG/ML IJ SOLN
INTRAMUSCULAR | Status: DC | PRN
  Administered 2018-07-07: 140 mg via INTRAVENOUS

## 2018-07-07 MED ORDER — PHENYLEPHRINE HCL 10 MG/ML IJ SOLN
INTRAMUSCULAR | Status: DC | PRN
  Administered 2018-07-07 (×4): 100 ug via INTRAVENOUS

## 2018-07-07 MED ORDER — ESTROGENS, CONJUGATED 0.625 MG/GM VA CREA
TOPICAL_CREAM | VAGINAL | Status: AC
  Filled 2018-07-07: qty 30

## 2018-07-07 MED ORDER — LACTATED RINGERS IV SOLN: INTRAVENOUS | Status: DC

## 2018-07-07 MED ORDER — DEXAMETHASONE SODIUM PHOSPHATE 10 MG/ML IJ SOLN
INTRAMUSCULAR | Status: DC | PRN
  Administered 2018-07-07: 4 mg via INTRAVENOUS

## 2018-07-07 SURGICAL SUPPLY — 18 items
BAG INFUSER PRESSURE 100CC (MISCELLANEOUS) ×2 IMPLANT
CANISTER SUCT 3000ML PPV (MISCELLANEOUS) ×2 IMPLANT
CATH ROBINSON RED A/P 16FR (CATHETERS) ×2 IMPLANT
COVER WAND RF STERILE (DRAPES) ×2 IMPLANT
ELECT REM PT RETURN 9FT ADLT (ELECTROSURGICAL) ×2
ELECTRODE REM PT RTRN 9FT ADLT (ELECTROSURGICAL) ×1 IMPLANT
GLOVE BIO SURGEON STRL SZ7 (GLOVE) ×2 IMPLANT
GLOVE INDICATOR 7.5 STRL GRN (GLOVE) ×2 IMPLANT
GOWN STRL REUS W/ TWL LRG LVL3 (GOWN DISPOSABLE) ×2 IMPLANT
GOWN STRL REUS W/TWL LRG LVL3 (GOWN DISPOSABLE) ×2
KIT PROCEDURE FLUENT (KITS) ×2 IMPLANT
KIT TURNOVER CYSTO (KITS) ×2 IMPLANT
PACK DNC HYST (MISCELLANEOUS) ×2 IMPLANT
PAD OB MATERNITY 4.3X12.25 (PERSONAL CARE ITEMS) ×2 IMPLANT
PAD PREP 24X41 OB/GYN DISP (PERSONAL CARE ITEMS) ×2 IMPLANT
SOL .9 NS 3000ML IRR  AL (IV SOLUTION) ×1
SOL .9 NS 3000ML IRR UROMATIC (IV SOLUTION) ×1 IMPLANT
TUBING CONNECTING 10 (TUBING) ×2 IMPLANT

## 2018-07-07 NOTE — Interval H&P Note (Signed)
History and Physical Interval Note:  07/07/2018 10:37 AM  Alexis Farmer  has presented today for surgery, with the diagnosis of persistant postmenopausal bleeding  The various methods of treatment have been discussed with the patient and family. After consideration of risks, benefits and other options for treatment, the patient has consented to  Procedure(s): DILATATION AND CURETTAGE /HYSTEROSCOPY (N/A) as a surgical intervention .  The patient's history has been reviewed, patient examined, no change in status, stable for surgery.  I have reviewed the patient's chart and labs.  Questions were answered to the patient's satisfaction.     Benjaman Kindler

## 2018-07-07 NOTE — Discharge Instructions (Signed)
Discharge instructions after a hysteroscopy with dilation and curettage  You have something that is common called "atrophy" along your vaginal skin and inside your uterus, and from what I saw today, it looks like that is where your bleeding is coming from. (I did sample all the tissue that could be hiding cancer today, so we will have pathology make sure there is nothing worse to worry about, but I didn't see anything concerning today.)  I recommend you use vaginal estrogen cream topically for the next 2 weeks every night. I gave you a tube of this cream. Use a pea-sized amount and dab it inside where you would place a tampon every night. After these two weeks, I recommend continuing twice a week for the indefinite future. You probably don't need it forever, but if your spotting comes back and your ultrasound is normal, we should try this cream instead of surgery next time.   Of note, this estrogen cream is NOT going into your bloodstream, so even though the vaginal cream comes with a list of all the risks of the pill form of estrogen, the topical estrogen does NOT increase your risk of any cancers or heart troubles.   I appreciate your sweet personality and I'm glad to be your doctor!  Also, continue taking the medicines you were taking before surgery at the doses you were; you don't need to change anything.  Signs and Symptoms to Report  Call our office at [phone] if you have any of the following:    Fever over 100.4 degrees or higher  Severe stomach pain not relieved with pain medications  Bright red bleeding thats heavier than a period that does not slow with rest after the first 24 hours  To go the bathroom a lot (frequency), you cant hold your urine (urgency), or it hurts when you empty your bladder (urinate)  Chest pain  Shortness of breath  Pain in the calves of your legs  Severe nausea and vomiting not relieved with anti-nausea medications  Any concerns  What You  Can Expect after Surgery  You may see some pink tinged, bloody fluid. This is normal. You may also have cramping for several days.   Activities after Your Discharge Follow these guidelines to help speed your recovery at home:  Dont drive if you are in pain or taking narcotic pain medicine. You may drive when you can safely slam on the brakes, turn the wheel forcefully, and rotate your torso comfortably. This is typically 4-7 days. Practice in a parking lot or side street prior to attempting to drive regularly.   Ask others to help with household chores for 4 weeks.  Dont do strenuous activities, exercises, or sports like vacuuming, tennis, squash, etc. until your doctor says it is safe to do so.  Walk as you feel able. Rest often since it may take a week or two for your energy level to return to normal.   You may climb stairs  Avoid constipation:   -Eat fruits, vegetables, and whole grains. Eat small meals as your appetite will take time to return to normal.   -Drink 6 to 8 glasses of water each day unless your doctor has told you to limit your fluids.   -Use a laxative or stool softener as needed if constipation becomes a problem. You may take Miralax, metamucil, Citrucil, Colace, Senekot, FiberCon, etc. If this does not relieve the constipation, try two tablespoons of Milk Of Magnesia every 8 hours until your bowels  move.   You may shower.   Do not get in a hot tub, swimming pool, etc. until your doctor agrees.  Do not douche, use tampons, or have sex until your doctor says it is okay, usually about 2 weeks.  Take your pain medicine when you need it. The medicine may not work as well if the pain is bad.  Take the medicines you were taking before surgery. Other medications you might need are pain medications (ibuprofen), medications for constipation (Colace) and nausea medications (Zofran).    AMBULATORY SURGERY  DISCHARGE INSTRUCTIONS   1) The drugs that you were given will  stay in your system until tomorrow so for the next 24 hours you should not:  A) Drive an automobile B) Make any legal decisions C) Drink any alcoholic beverage   2) You may resume regular meals tomorrow.  Today it is better to start with liquids and gradually work up to solid foods.  You may eat anything you prefer, but it is better to start with liquids, then soup and crackers, and gradually work up to solid foods.   3) Please notify your doctor immediately if you have any unusual bleeding, trouble breathing, redness and pain at the surgery site, drainage, fever, or pain not relieved by medication.    4) Additional Instructions:   Please contact your physician with any problems or Same Day Surgery at [phone], Monday through Friday 6 am to 4 pm, or [place] [facility] number at [phone].

## 2018-07-07 NOTE — Anesthesia Preprocedure Evaluation (Addendum)
Anesthesia Evaluation  Patient identified by MRN, date of birth, ID band Patient awake    Reviewed: Allergy & Precautions, NPO status , Patient's Chart, lab work & pertinent test results  History of Anesthesia Complications Negative for: history of anesthetic complications  Airway Mallampati: III  TM Distance: >3 FB Neck ROM: Full    Dental  (+) Edentulous Upper, Missing, Poor Dentition   Pulmonary neg pulmonary ROS, neg sleep apnea, neg COPD,    breath sounds clear to auscultation- rhonchi (-) wheezing      Cardiovascular hypertension, Pt. on medications + Peripheral Vascular Disease and +CHF  (-) CAD, (-) Past MI and (-) CABG  Rhythm:Regular Rate:Normal - Systolic murmurs and - Diastolic murmurs    Neuro/Psych neg Seizures PSYCHIATRIC DISORDERS Anxiety Depression negative neurological ROS     GI/Hepatic negative GI ROS, Neg liver ROS,   Endo/Other  diabetes, Insulin Dependent  Renal/GU Renal InsufficiencyRenal disease     Musculoskeletal  (+) Arthritis ,   Abdominal (+) + obese,   Peds  Hematology negative hematology ROS (+)   Anesthesia Other Findings Past Medical History: No date: Arthritis No date: Chickenpox No date: Diabetes mellitus without complication (HCC)     Comment:  one elevated reading/ no treatment No date: Diverticulitis No date: GI bleed No date: High cholesterol No date: History of blood transfusion No date: Hypertension No date: Renal insufficiency   Reproductive/Obstetrics                            Anesthesia Physical Anesthesia Plan  ASA: III  Anesthesia Plan: General   Post-op Pain Management:    Induction: Intravenous  PONV Risk Score and Plan: 2 and Ondansetron and Midazolam  Airway Management Planned: Oral ETT  Additional Equipment:   Intra-op Plan:   Post-operative Plan: Extubation in OR  Informed Consent: I have reviewed the patients  History and Physical, chart, labs and discussed the procedure including the risks, benefits and alternatives for the proposed anesthesia with the patient or authorized representative who has indicated his/her understanding and acceptance.     Dental advisory given  Plan Discussed with: CRNA and Anesthesiologist  Anesthesia Plan Comments:         Anesthesia Quick Evaluation

## 2018-07-07 NOTE — Anesthesia Post-op Follow-up Note (Signed)
Anesthesia QCDR form completed.        

## 2018-07-07 NOTE — Op Note (Signed)
Operative Report Hysteroscopy with Dilation and Curettage   Indications: Postmenopausal bleeding   Pre-operative Diagnosis: PMB   Post-operative Diagnosis: same.  Procedure: 1. Exam under anesthesia 2. Fractional D&C 3. Hysteroscopy  Surgeon: Benjaman Kindler, MD  Assistant(s):  None  Anesthesia: General LMA anesthesia  Anesthesiologist: Emmie Niemann, MD Anesthesiologist: Emmie Niemann, MD CRNA: Willette Alma, CRNA  Estimated Blood Loss:  Minimal              Total IV Fluids: see anesthesia's report  Urine Output: 155ml  Total Fluid Deficit: na         Specimens: Endocervical curettings, endometrial curettings         Complications:  None; patient tolerated the procedure well.         Disposition: PACU - hemodynamically stable.         Condition: stable  Findings: Uterus measuring 6 cm by sound; normal cervix, vagina, perineum. Vaginal atrophy, endometrial atrophy.  Indication for procedure/Consents: 78 y.o. F here for scheduled surgery for the aforementioned diagnoses.  Risks of surgery were discussed with the patient including but not limited to: bleeding which may require transfusion; infection which may require antibiotics; injury to uterus or surrounding organs; intrauterine scarring which may impair future fertility; need for additional procedures including laparotomy or laparoscopy; and other postoperative/anesthesia complications. Written informed consent was obtained.    Procedure Details:  Fractional D&C only  The patient was taken to the operating room where anesthesia was administered and was found to be adequate. After a formal and adequate timeout was performed, she was placed in the dorsal lithotomy position and examined with the above findings. She was then prepped and draped in the sterile manner. Her bladder was catheterized for an estimated amount of clear, yellow urine. A weighed speculum was then placed in the patient's vagina and a single  tooth tenaculum was applied to the anterior lip of the cervix.  Her cervix was serially dilated to 15 [place] using Hanks dilators. An ECC was performed. The hysteroscope was introduced to reveal the above findings. A sharp curettage was then performed until there was a gritty texture in all four quadrants. The tenaculum was removed from the anterior lip of the cervix and the vaginal speculum was removed after applying silver nitrate for good hemostasis.   The patient tolerated the procedure well and was taken to the recovery area awake and in stable condition. She received iv acetaminophen and Toradol prior to leaving the OR.  The patient will be discharged to home as per PACU criteria. Routine postoperative instructions given. She was prescribed Ibuprofen and Colace. She will follow up in the clinic in two weeks for postoperative evaluation.  Premarin was placed in vagina to assist with atrophic bleeding at the posterior forchette from the speculum. I will recommend she use nightly for a month, and 2x weekly thereafter. This might solve her PMB if it comes mainly from her vagina.

## 2018-07-07 NOTE — Anesthesia Postprocedure Evaluation (Signed)
Anesthesia Post Note  Patient: Alexis Farmer  Procedure(s) Performed: DILATATION AND CURETTAGE /HYSTEROSCOPY (N/A )  Patient location during evaluation: PACU Anesthesia Type: General Level of consciousness: awake and alert and oriented Pain management: pain level controlled Vital Signs Assessment: post-procedure vital signs reviewed and stable Respiratory status: spontaneous breathing, nonlabored ventilation and respiratory function stable Cardiovascular status: blood pressure returned to baseline and stable Postop Assessment: no signs of nausea or vomiting Anesthetic complications: no     Last Vitals:  Vitals:   07/07/18 1145 07/07/18 1200  BP: (!) 121/45 (!) 97/35  Pulse: 71 75  Resp: (!) 21 17  Temp: (!) 36.2 C   SpO2: 98% 97%    Last Pain:  Vitals:   07/07/18 0853  TempSrc: Temporal  PainSc: 0-No pain                 Stafford [place]

## 2018-07-07 NOTE — Anesthesia Procedure Notes (Signed)
Procedure Name: Intubation Date/Time: 07/07/2018 10:59 AM Performed by: Willette Alma, CRNA Pre-anesthesia Checklist: Patient identified, Patient being monitored, Timeout performed, Emergency Drugs available and Suction available Patient Re-evaluated:Patient Re-evaluated prior to induction Oxygen Delivery Method: Circle system utilized Preoxygenation: Pre-oxygenation with 100% oxygen Induction Type: IV induction Ventilation: Mask ventilation without difficulty Laryngoscope Size: McGraph and 4 Grade View: Grade I Tube type: Oral Tube size: 7.0 mm Number of attempts: 1 Airway Equipment and Method: Video-laryngoscopy and Stylet Placement Confirmation: ETT inserted through vocal cords under direct vision,  positive ETCO2 and breath sounds checked- equal and bilateral Secured at: 21 cm Tube secured with: Tape Dental Injury: Teeth and Oropharynx as per pre-operative assessment

## 2018-07-07 NOTE — Transfer of Care (Signed)
Immediate Anesthesia Transfer of Care Note  Patient: Alexis Farmer  Procedure(s) Performed: DILATATION AND CURETTAGE /HYSTEROSCOPY (N/A )  Patient Location: PACU  Anesthesia Type:General  Level of Consciousness: awake, alert  and oriented  Airway & Oxygen Therapy: Patient Spontanous Breathing and Patient connected to face mask oxygen  Post-op Assessment: Report given to RN and Post -op Vital signs reviewed and stable  Post vital signs: Reviewed and stable  Last Vitals:  Vitals Value Taken Time  BP 146/125 07/07/2018 11:43 AM  Temp    Pulse 66 07/07/2018 11:46 AM  Resp 14 07/07/2018 11:46 AM  SpO2 100 % 07/07/2018 11:46 AM  Vitals shown include unvalidated device data.  Last Pain:  Vitals:   07/07/18 0853  TempSrc: Temporal  PainSc: 0-No pain         Complications: No apparent anesthesia complications

## 2018-07-08 LAB — SURGICAL PATHOLOGY

## 2018-07-11 ENCOUNTER — Encounter: Payer: Self-pay | Admitting: Podiatry

## 2018-07-11 NOTE — Progress Notes (Signed)
Subjective:  Patient presents to clinic with cc of  painful, thick, discolored, elongated toenails 1-5 b/l that become tender and cannot cut because of thickness.  She will be fitted for diabetic shoes on today.  Patient relates she will be having hysterectomy done after the holidays.  McLean-Scocuzza, Nino Glow, MD is her PCP.   Current Outpatient Medications:  .  B-D INS SYRINGE 0.5CC/30GX1/2" 30G X 1/2" 0.5 ML MISC, USE AS DIRECTED, Disp: 500 each, Rfl: 1 .  blood glucose meter kit and supplies KIT, Accu chek, Dx code E11.65, check 3 times daily, Disp: 1 each, Rfl: 0 .  cloNIDine (CATAPRES) 0.2 MG tablet, TAKE 1 TABLET (0.2 MG TOTAL) BY MOUTH 2 (TWO) TIMES DAILY. (Patient taking differently: Take 0.2 mg by mouth 3 (three) times daily. ), Disp: 180 tablet, Rfl: 3 .  glucose blood ([employer] BLOOD GLUCOSE TEST) test strip, USE TO CHECK CBG'S 3 TIMES DAILY. (Patient taking differently: USE TO CHECK CBG'S 3 TIMES DAILY. Guid test strips), Disp: 100 each, Rfl: 12 .  insulin regular (NOVOLIN R) 100 units/mL injection, INJECT 15 UNITS TOTAL INTO THE SKIN 3 (THREE) TIMES DAILY BEFORE MEALS. (Patient taking differently: Inject 15 Units into the skin 3 (three) times daily before meals. INJECT 15 UNITS TOTAL INTO THE SKIN 3 (THREE) TIMES DAILY BEFORE MEALS.), Disp: 43 mL, Rfl: 1 .  Multiple Vitamin (MULTIVITAMIN WITH MINERALS) TABS tablet, Take 1 tablet by mouth daily., Disp: , Rfl:  .  olmesartan (BENICAR) 40 MG tablet, Take 1 tablet (40 mg total) by mouth daily., Disp: 90 tablet, Rfl: 3 .  rosuvastatin (CRESTOR) 10 MG tablet, TAKE 1 TABLET (10 MG TOTAL) BY MOUTH DAILY. (Patient taking differently: Take 10 mg by mouth daily. TAKE 1 TABLET (10 MG TOTAL) BY MOUTH DAILY.), Disp: 90 tablet, Rfl: 3 .  spironolactone (ALDACTONE) 25 MG tablet, Take 1 tablet (25 mg total) by mouth daily., Disp: 90 tablet, Rfl: 3 .  torsemide (DEMADEX) 20 MG tablet, Take 1 tablet (20 mg total) by mouth 2 (two) times daily., Disp:  180 tablet, Rfl: 3 .  insulin NPH Human (HUMULIN N,NOVOLIN N) 100 UNIT/ML injection, Inject 20-30 Units into the skin See admin instructions. Inject 30 units subcutaneously in the morning and 20 units in the evening, Disp: , Rfl:  .  mupirocin ointment (BACTROBAN) 2 %, Apply 1 application topically 2 (two) times daily. Right index finger and left forearm, Disp: 30 g, Rfl: 0   Allergies  Allergen Reactions  . Penicillins Rash    Has patient had a PCN reaction causing immediate rash, facial/tongue/throat swelling, SOB or lightheadedness with hypotension: Yes Has patient had a PCN reaction causing severe rash involving mucus membranes or skin necrosis: No Has patient had a PCN reaction that required hospitalization No Has patient had a PCN reaction occurring within the last 10 years: Yes If all of the above answers are "NO", then may proceed with Cephalosporin use.      Objective:   Vascular Examination: Capillary refill time immediate x 10 digits Dorsalis pedis present b/l Posterior tibial pulses diminished b/l Digital hair x 10 digits absent Skin temperature gradient WNL b/l  Dermatological Examination: Skin with normal turgor, texture and tone b/l  Toenails 1-5 b/l discolored, thick, dystrophic with subungual debris and pain with palpation to nailbeds due to thickness of nails.  No open wounds.  No interdigital macerations.  Musculoskeletal Examination: Muscle strength 5/5 to all LE muscle groups  Decreased ankle joint ROM b/l  Pes planus  foot type b/l  Neurological Examination: Sensation diminished with 10 gram monofilament  Assessment: Mycotic nail infection with pain 1-5 b/l  Plan: Continue diabetic foot care principles. Start process for new diabetic shoes. Debride painful toenails 1-5 b/l with no iatrogenic bleeding. Continue soft, supportive shoe gear daily. Report any pedal injuries to a medical professional immediately Follow up 10 weeks. Marland Kitchen

## 2018-07-15 ENCOUNTER — Telehealth: Payer: Self-pay | Admitting: Internal Medicine

## 2018-07-15 NOTE — Telephone Encounter (Signed)
Unable to place order for some reason. Please advise.

## 2018-07-15 NOTE — Telephone Encounter (Signed)
Copied from [place] [phone]. Topic: Quick Communication - Rx Refill/Question >> Jul 15, 2018  9:20 AM [name] wrote: Medication: B-D INS SYRINGE 0.5CC/30GX1/2" 30G X 1/2" 0.5 ML MISC - pt has 2 days on hand  Has the patient contacted their pharmacy? Yes - told to call her pharmacy Preferred Pharmacy (with phone number or street name): [id number] [address], [place]. [phone] (Phone) [phone] (Fax)

## 2018-07-16 ENCOUNTER — Other Ambulatory Visit: Payer: Self-pay | Admitting: Internal Medicine

## 2018-07-16 DIAGNOSIS — Z794 Long term (current) use of insulin: Secondary | ICD-10-CM

## 2018-07-16 DIAGNOSIS — E1165 Type 2 diabetes mellitus with hyperglycemia: Secondary | ICD-10-CM

## 2018-07-16 MED ORDER — "INSULIN SYRINGE-NEEDLE U-100 30G X 1/2"" 1 ML MISC": 1.0000 | Freq: Every day | 12 refills | Status: DC

## 2018-07-21 ENCOUNTER — Encounter (INDEPENDENT_AMBULATORY_CARE_PROVIDER_SITE_OTHER): Payer: Medicare Other

## 2018-07-21 ENCOUNTER — Ambulatory Visit (INDEPENDENT_AMBULATORY_CARE_PROVIDER_SITE_OTHER): Payer: Medicare Other | Admitting: Vascular Surgery

## 2018-08-04 ENCOUNTER — Ambulatory Visit: Payer: Medicare Other | Admitting: Orthotics

## 2018-08-13 ENCOUNTER — Encounter (INDEPENDENT_AMBULATORY_CARE_PROVIDER_SITE_OTHER): Payer: Self-pay | Admitting: Nurse Practitioner

## 2018-08-13 ENCOUNTER — Ambulatory Visit (INDEPENDENT_AMBULATORY_CARE_PROVIDER_SITE_OTHER): Payer: Medicare Other | Admitting: Nurse Practitioner

## 2018-08-13 ENCOUNTER — Ambulatory Visit (INDEPENDENT_AMBULATORY_CARE_PROVIDER_SITE_OTHER): Payer: Medicare Other

## 2018-08-13 ENCOUNTER — Other Ambulatory Visit: Payer: Self-pay

## 2018-08-13 VITALS — BP 136/75 | HR 62 | Resp 16 | Wt 259.8 lb

## 2018-08-13 DIAGNOSIS — I10 Essential (primary) hypertension: Secondary | ICD-10-CM

## 2018-08-13 DIAGNOSIS — I6521 Occlusion and stenosis of right carotid artery: Secondary | ICD-10-CM | POA: Diagnosis not present

## 2018-08-13 DIAGNOSIS — Z7902 Long term (current) use of antithrombotics/antiplatelets: Secondary | ICD-10-CM

## 2018-08-13 DIAGNOSIS — G458 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: Secondary | ICD-10-CM

## 2018-08-13 DIAGNOSIS — I739 Peripheral vascular disease, unspecified: Secondary | ICD-10-CM | POA: Diagnosis not present

## 2018-08-13 DIAGNOSIS — E782 Mixed hyperlipidemia: Secondary | ICD-10-CM

## 2018-08-13 DIAGNOSIS — Z7982 Long term (current) use of aspirin: Secondary | ICD-10-CM

## 2018-08-13 NOTE — Progress Notes (Signed)
SUBJECTIVE:  Patient ID: Alexis Farmer, female    DOB: 11/07/1940, 78 y.o.   MRN: [id number] Chief Complaint  Patient presents with  . Follow-up    ultrasound follow up    HPI  Alexis Farmer is a 78 y.o. female The patient is seen for follow up evaluation of carotid stenosis. The carotid stenosis followed by ultrasound.  There have been no interval changes in lower extremity symptoms. No interval shortening of the patient's claudication distance or development of rest pain symptoms. No new ulcers or wounds have occurred since the last visit.  There have been no significant changes to the patient's overall health care.  The patient denies amaurosis fugax. There is no recent history of TIA symptoms or focal motor deficits. There is no prior documented CVA.  The patient is taking enteric-coated aspirin 81 mg daily.  There is no history of migraine headaches. There is no history of seizures.  The patient has a history of coronary artery disease, no recent episodes of angina or shortness of breath. The patient denies PAD or claudication symptoms. There is a history of hyperlipidemia which is being treated with a statin.     The patient denies history of DVT, PE or superficial thrombophlebitis. The patient denies recent episodes of angina or shortness of breath.    Carotid Duplex done today shows 1 to 39% stenosis of her left carotid artery with 40 to 59% stenosis of her right.  No change compared to last study on 07/22/2017.  ABI Rt=1.51 and Lt=1.25  (TBI's Rt=0.70 and Lt=0.87) The tibial arteries were biphasic on the bilateral lower extremities.  The toe waveforms were normal.  There was incidental finding that the brachial artery pressure different from the left by greater than 40 mmHg.  This is consistent with subclavian steal syndrome.  Subsequent upper extremity duplex revealed monophasic waveforms in the brachial artery Past Medical History:  Diagnosis Date  . Arthritis     . Chickenpox   . Diabetes mellitus without complication ([place])    one elevated reading/ no treatment  . Diverticulitis   . GI bleed   . High cholesterol   . History of blood transfusion   . Hypertension   . Renal insufficiency     Past Surgical History:  Procedure Laterality Date  . APPENDECTOMY    . CHOLECYSTECTOMY    . ECTOPIC PREGNANCY SURGERY    . EYE SURGERY     bilateral cataracts  . gallbladder     . HYSTEROSCOPY W/D&C N/A 10/26/2016   Procedure: DILATATION AND CURETTAGE /HYSTEROSCOPY;  Surgeon: Benjaman Kindler, MD;  Location: [facility];  Service: Gynecology;  Laterality: N/A;  . HYSTEROSCOPY W/D&C N/A 07/07/2018   Procedure: DILATATION AND CURETTAGE /HYSTEROSCOPY;  Surgeon: Benjaman Kindler, MD;  Location: [facility];  Service: Gynecology;  Laterality: N/A;  . THYROIDECTOMY, PARTIAL      Social History   Socioeconomic History  . Marital status: Widowed    Spouse name: Not on file  . Number of children: Not on file  . Years of education: Not on file  . Highest education level: Not on file  Occupational History  . Not on file  Social Needs  . Financial resource strain: Not on file  . Food insecurity:    Worry: Not on file    Inability: Not on file  . Transportation needs:    Medical: Not on file    Non-medical: Not on file  Tobacco Use  . Smoking status: Never Smoker  .  Smokeless tobacco: Never Used  Substance and Sexual Activity  . Alcohol use: No  . Drug use: No  . Sexual activity: Not on file  Lifestyle  . Physical activity:    Days per week: Not on file    Minutes per session: Not on file  . Stress: Not on file  Relationships  . Social connections:    Talks on phone: Not on file    Gets together: Not on file    Attends religious service: Not on file    Active member of club or organization: Not on file    Attends meetings of clubs or organizations: Not on file    Relationship status: Not on file  . Intimate partner violence:    Fear of current  or ex partner: Not on file    Emotionally abused: Not on file    Physically abused: Not on file    Forced sexual activity: Not on file  Other Topics Concern  . Not on file  Social History Narrative   Lives alone    From [place]   Widowed     Family History  Problem Relation Age of Onset  . Diabetes Mother   . Hypertension Mother   . Stroke Mother   . Diabetes Other   . Diabetes Sister   . Heart disease Sister     Allergies  Allergen Reactions  . Penicillins Rash    Has patient had a PCN reaction causing immediate rash, facial/tongue/throat swelling, SOB or lightheadedness with hypotension: Yes Has patient had a PCN reaction causing severe rash involving mucus membranes or skin necrosis: No Has patient had a PCN reaction that required hospitalization No Has patient had a PCN reaction occurring within the last 10 years: Yes If all of the above answers are "NO", then may proceed with Cephalosporin use.      Review of Systems   Review of Systems: Negative Unless Checked Constitutional: '[]' Weight loss  '[]' Fever  '[]' Chills Cardiac: '[]' Chest pain   '[]'  Atrial Fibrillation  '[]' Palpitations   '[]' Shortness of breath when laying flat   '[]' Shortness of breath with exertion. '[]' Shortness of breath at rest Vascular:  '[]' Pain in legs with walking   '[]' Pain in legs with standing '[]' Pain in legs when laying flat   '[]' Claudication    '[]' Pain in feet when laying flat    '[]' History of DVT   '[]' Phlebitis   '[x]' Swelling in legs   '[]' Varicose veins   '[]' Non-healing ulcers Pulmonary:   '[]' Uses home oxygen   '[]' Productive cough   '[]' Hemoptysis   '[]' Wheeze  '[]' COPD   '[]' Asthma Neurologic:  '[]' Dizziness   '[]' Seizures  '[]' Blackouts '[]' History of stroke   '[]' History of TIA  '[]' Aphasia   '[]' Temporary Blindness   '[]' Weakness or numbness in arm   '[]' Weakness or numbness in leg Musculoskeletal:   '[]' Joint swelling   '[]' Joint pain   '[x]' Low back pain  '[]'  History of Knee Replacement '[x]' Arthritis '[]' back Surgeries  '[]'  Spinal Stenosis    Hematologic:   '[]' Easy bruising  '[]' Easy bleeding   '[]' Hypercoagulable state   '[]' Anemic Gastrointestinal:  '[]' Diarrhea   '[]' Vomiting  '[]' Gastroesophageal reflux/heartburn   '[]' Difficulty swallowing. '[]' Abdominal pain Genitourinary:  '[]' Chronic kidney disease   '[]' Difficult urination  '[]' Anuric   '[]' Blood in urine '[]' Frequent urination  '[]' Burning with urination   '[]' Hematuria Skin:  '[]' Rashes   '[]' Ulcers '[]' Wounds Psychological:  '[]' History of anxiety   '[]'  History of major depression  '[]'  Memory Difficulties      OBJECTIVE:   Physical Exam  BP 136/75 (  BP Location: Right Arm)   Pulse 62   Resp 16   Wt 259 lb 12.8 oz (117.8 kg)   BMI 46.02 kg/m   Gen: WD/WN, NAD Head: [place]/AT, No temporalis wasting.  Ear/Nose/Throat: Hearing grossly intact, nares w/o erythema or drainage Eyes: PER, EOMI, sclera nonicteric.  Neck: Supple, no masses.  No JVD.  Pulmonary:  Good air movement, no use of accessory muscles.  Cardiac: RRR Vascular:  No carotid bruits auscultated Vessel Right Left  Radial Palpable Palpable  Dorsalis Pedis Palpable Palpable  Posterior Tibial Palpable Palpable   Gastrointestinal: soft, non-distended. No guarding/no peritoneal signs.  Musculoskeletal: M/S 5/5 throughout.  No deformity or atrophy.  Neurologic: Pain and light touch intact in extremities.  Symmetrical.  Speech is fluent. Motor exam as listed above. Psychiatric: Judgment intact, Mood & affect appropriate for pt's clinical situation. Dermatologic: No Venous rashes. No Ulcers Noted.  No changes consistent with cellulitis. Lymph : No Cervical lymphadenopathy, no lichenification or skin changes of chronic lymphedema.       ASSESSMENT AND PLAN:  1. Essential hypertension Continue antihypertensive medications as already ordered, these medications have been reviewed and there are no changes at this time.   2. Mixed hyperlipidemia Continue statin as ordered and reviewed, no changes at this time   3. PAD (peripheral artery disease) (HCC)  ABI  Rt=1.51 and Lt=1.25  (TBI's Rt=0.70 and Lt=0.87) The tibial arteries were biphasic on the bilateral lower extremities.  The toe waveforms were normal.     Recommend:  The patient has evidence of atherosclerosis of the lower extremities with claudication.  The patient does not voice lifestyle limiting changes at this point in time.  Noninvasive studies do not suggest clinically significant change.  No invasive studies, angiography or surgery at this time The patient should continue walking and begin a more formal exercise program.  The patient should continue antiplatelet therapy and aggressive treatment of the lipid abnormalities  No changes in the patient's medications at this time  The patient should continue wearing graduated compression socks 10-15 mmHg strength to control the mild edema.   We will repeat studies in 1 year - VAS [place] ABI WITH/WO TBI; Future  4. Stenosis of right carotid artery Recommend:  Given the patient's asymptomatic subcritical stenosis no further invasive testing or surgery at this time.   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  This is consistent with subclavian steal syndrome.  Subsequent upper extremity duplex revealed monophasic waveforms in the brachial artery  Today was an incidental finding of subclavian steal.  The greater than 30 mmHg difference in brachial pressures as well as abnormal brachial artery waveforms suggest right subclavian steal.  Currently the patient does not endorse any pain or numbness of her right arm when she is active with her arm.  She is not  symptomatic.  We will continue to monitor this with her ABIs annually.   Current Outpatient Medications on File Prior to Visit  Medication Sig Dispense Refill  . blood glucose meter kit and supplies KIT Accu chek, Dx code E11.65, check 3 times daily 1 each 0  . cloNIDine (CATAPRES) 0.2 MG  tablet TAKE 1 TABLET (0.2 MG TOTAL) BY MOUTH 2 (TWO) TIMES DAILY. (Patient taking differently: Take 0.2 mg by mouth 3 (three) times daily. ) 180 tablet 3  . glucose blood ([employer] BLOOD GLUCOSE TEST) test strip USE TO CHECK CBG'S 3 TIMES DAILY. (Patient taking differently: USE TO CHECK CBG'S 3 TIMES DAILY. Guid test strips) 100 each 12  . insulin NPH Human (HUMULIN N,NOVOLIN N) 100 UNIT/ML injection Inject 20-30 Units into the skin See admin instructions. Inject 30 units subcutaneously in the morning and 20 units in the evening    . insulin regular (NOVOLIN R) 100 units/mL injection INJECT 15 UNITS TOTAL INTO THE SKIN 3 (THREE) TIMES DAILY BEFORE MEALS. (Patient taking differently: Inject 15 Units into the skin 3 (three) times daily before meals. INJECT 15 UNITS TOTAL INTO THE SKIN 3 (THREE) TIMES DAILY BEFORE MEALS.) 43 mL 1  . Insulin Syringe-Needle U-100 30G X 1/2" 1 ML MISC 1 Device by Does not apply route 5 (five) times daily. (NPH 2x per day and as needed Novolin R 3x per day) 500 each 12  . Multiple Vitamin (MULTIVITAMIN WITH MINERALS) TABS tablet Take 1 tablet by mouth daily.    . mupirocin ointment (BACTROBAN) 2 % Apply 1 application topically 2 (two) times daily. Right index finger and left forearm 30 g 0  . olmesartan (BENICAR) 40 MG tablet Take 1 tablet (40 mg total) by mouth daily. 90 tablet 3  . rosuvastatin (CRESTOR) 10 MG tablet TAKE 1 TABLET (10 MG TOTAL) BY MOUTH DAILY. (Patient taking differently: Take 10 mg by mouth daily. TAKE 1 TABLET (10 MG TOTAL) BY MOUTH DAILY.) 90 tablet 3  . spironolactone (ALDACTONE) 25 MG tablet Take 1 tablet (25 mg total) by mouth daily. 90 tablet 3  . torsemide  (DEMADEX) 20 MG tablet Take 1 tablet (20 mg total) by mouth 2 (two) times daily. 180 tablet 3   No current facility-administered medications on file prior to visit.     Patient Instructions  Carotid Artery Disease  The carotid arteries are arteries on both sides of the neck. They carry blood to the brain, face, and neck. Carotid artery disease happens when these arteries become smaller (narrow) or get blocked. If these arteries become smaller or get blocked, you are more likely to have a stroke or a warning stroke (transient ischemic attack). Follow these instructions at home:  Take over-the-counter and prescription medicines only as told by your doctor.  Make sure you understand all instructions about your medicines. Do not stop taking your medicines without talking to your doctor first.  Follow your doctor's diet instructions. It is important to follow a healthy diet. ? Eat foods that include plenty of: ? Fresh fruits. ? Vegetables. ? Lean meats. ? Avoid these foods: ? Foods that are high in fat. ? Foods that are high in salt (sodium). ? Foods that are fried. ? Foods that are processed. ? Foods that have few good nutrients (poor nutritional value).  Keep a healthy weight.  Stay active. Get at least 30 minutes of activity every day.  Do not smoke.  Limit alcohol use to: ? No more than 2 drinks a day for men. ? No more than 1 drink a day for women who are not pregnant.  Do not use illegal drugs.  Keep all follow-up visits as told by your doctor. This is important. Contact a doctor if: Get help right away if:  You have any symptoms of stroke or TIA. The acronym  BEFAST is an easy way to remember the main warning signs of stroke. ? B = Balance problems. Signs include dizziness, sudden trouble walking, or loss of balance ? E = Eye problems. This includes trouble seeing or a sudden change in vision. ? F = Face changes. This includes sudden weakness or numbness of the face, or  the face or eyelid drooping to one side. ? A = Arm weakness or numbness. This happens suddenly and usually on one side of the body. ? S = Speech problems. This includes trouble speaking or trouble understanding. ? T = Time. Time to call 911 or seek emergency care. Do not wait to see if symptoms go away. Make note of the time your symptoms started.  Other signs of stroke may include: ? A sudden, severe headache with no known cause. ? Feeling sick to your stomach (nauseous) or throwing up (vomiting). ? Seizure. Call your local emergency services (911 in [place] Do notdrive yourself to the clinic or hospital. Summary  The carotid arteries are arteries on both sides of the neck.  If these arteries get smaller or get blocked, you are more likely to have a stroke or a warning stroke (transient ischemic attack).  Take over-the-counter and prescription medicines only as told by your doctor.  Keep all follow-up visits as told by your doctor. This is important. This information is not intended to replace advice given to you by your health care provider. Make sure you discuss any questions you have with your health care provider. Document Released: 05/21/2012 Document Revised: 05/30/2017 Document Reviewed: 05/30/2017 Elsevier Interactive Patient Education  2019 [employer].   No follow-ups on file.   Kris Hartmann, NP  This note was completed with Sales executive.  Any errors are purely unintentional.

## 2018-08-13 NOTE — Patient Instructions (Signed)
Carotid Artery Disease  The carotid arteries are arteries on both sides of the neck. They carry blood to the brain, face, and neck. Carotid artery disease happens when these arteries become smaller (narrow) or get blocked. If these arteries become smaller or get blocked, you are more likely to have a stroke or a warning stroke (transient ischemic attack). Follow these instructions at home:  Take over-the-counter and prescription medicines only as told by your doctor.  Make sure you understand all instructions about your medicines. Do not stop taking your medicines without talking to your doctor first.  Follow your doctor's diet instructions. It is important to follow a healthy diet. ? Eat foods that include plenty of: ? Fresh fruits. ? Vegetables. ? Lean meats. ? Avoid these foods: ? Foods that are high in fat. ? Foods that are high in salt (sodium). ? Foods that are fried. ? Foods that are processed. ? Foods that have few good nutrients (poor nutritional value).  Keep a healthy weight.  Stay active. Get at least 30 minutes of activity every day.  Do not smoke.  Limit alcohol use to: ? No more than 2 drinks a day for men. ? No more than 1 drink a day for women who are not pregnant.  Do not use illegal drugs.  Keep all follow-up visits as told by your doctor. This is important. Contact a doctor if: Get help right away if:  You have any symptoms of stroke or TIA. The acronym BEFAST is an easy way to remember the main warning signs of stroke. ? B = Balance problems. Signs include dizziness, sudden trouble walking, or loss of balance ? E = Eye problems. This includes trouble seeing or a sudden change in vision. ? F = Face changes. This includes sudden weakness or numbness of the face, or the face or eyelid drooping to one side. ? A = Arm weakness or numbness. This happens suddenly and usually on one side of the body. ? S = Speech problems. This includes trouble speaking or  trouble understanding. ? T = Time. Time to call 911 or seek emergency care. Do not wait to see if symptoms go away. Make note of the time your symptoms started.  Other signs of stroke may include: ? A sudden, severe headache with no known cause. ? Feeling sick to your stomach (nauseous) or throwing up (vomiting). ? Seizure. Call your local emergency services (911 in [place] Do notdrive yourself to the clinic or hospital. Summary  The carotid arteries are arteries on both sides of the neck.  If these arteries get smaller or get blocked, you are more likely to have a stroke or a warning stroke (transient ischemic attack).  Take over-the-counter and prescription medicines only as told by your doctor.  Keep all follow-up visits as told by your doctor. This is important. This information is not intended to replace advice given to you by your health care provider. Make sure you discuss any questions you have with your health care provider. Document Released: 05/21/2012 Document Revised: 05/30/2017 Document Reviewed: 05/30/2017 Elsevier Interactive Patient Education  2019 [employer]  

## 2018-08-15 LAB — HM DIABETES EYE EXAM

## 2018-08-19 NOTE — Progress Notes (Signed)
Cardiology Office Note  Date:  08/21/2018   ID:  Alexis Farmer, DOB 24-Oct-1940, MRN [id number]  PCP:  McLean-Scocuzza, Nino Glow, MD   Chief Complaint  Patient presents with  . other    12 mo f/up Edema no more than usual.  Medications reviewed verbally with patient.    HPI:  Alexis Farmer is a 78 y.o. woman with history of  obesity,  insulin-dependent diabetes, Hemoglobin A1c 9.5, poor diet, hypertension,  hyperlipidemia,  moderate LVH  hospital 08/13/2014  chest pain, malignant hypertension, possible sleep apnea,  chronic kidney disease,  Anxiety Notes indicate previous history of GI bleeding  colonoscopy which was normal by her report She presents to for follow-up of her hypertension, chronic diastolic CHF, poorly controlled diabetes, CKD  INTERVAL HISTORY:  The patient reports today for follow up. She is doing well overall today. Brought all of her medication bottles with her. She is still taking torsemide once daily, no longer taking extra on Monday and Friday.   She was in the hospital recently for a [facility] on 07/07/2018. Has been well since then.   Has been improving on sugar levels. She has recently changed her diet drastically and has stopped eating unhealthy foods. Doesn't eat rice, potato, bread, and pasta. Eats mostly vegetables and proteins like chicken and fish. The diet change has helped lower her weight. One night she tried to eat more than usual and noticed she can no longer eat as much as before.   Has had trouble walking, but still goes out to walk with her walker.   Reports leg swelling, but it has been improving. There is more swelling in the left leg.   Does not monitor blood pressure at home. In her most recent office visits her blood pressure has been lower than usual. It may be attributed to the weight loss.    Blood pressure 122/76 Total Chol 141/ LDL 66 HBA1C 9.8 CR 1.25 Glucose 273   EKG personally reviewed by myself on todays visit Shows sinus  rhythm with occasional APC rhythm. 65 bpm. LAD. Abnormal ECG   OTHER PAST MEDICAL HISTORY REVIEWED BY ME FOR TODAY'S VISIT:   10/2016  D&C, hysteroscopy. Postmenopausal bleeding.   Was in hospital 07/2016 outside the system Could not see, nausea, everything looked "white" Was told heart rate was low, Metoprolol was stopped Hospital: [facility] [place]   Previous Echocardiogram 08/13/2014 showed normal ejection fraction, normal RV size and function, moderate LVH  Renal artery duplex showing no blockage of the mid to distal vessels bilaterally, challenging study   PMH:   has a past medical history of Arthritis, Chickenpox, Diabetes mellitus without complication ([place]), Diverticulitis, GI bleed, High cholesterol, History of blood transfusion, Hypertension, and Renal insufficiency.  PSH:    Past Surgical History:  Procedure Laterality Date  . APPENDECTOMY    . CHOLECYSTECTOMY    . ECTOPIC PREGNANCY SURGERY    . EYE SURGERY     bilateral cataracts  . gallbladder     . HYSTEROSCOPY W/D&C N/A 10/26/2016   Procedure: DILATATION AND CURETTAGE /HYSTEROSCOPY;  Surgeon: Benjaman Kindler, MD;  Location: [facility] ORS;  Service: Gynecology;  Laterality: N/A;  . HYSTEROSCOPY W/D&C N/A 07/07/2018   Procedure: DILATATION AND CURETTAGE /HYSTEROSCOPY;  Surgeon: Benjaman Kindler, MD;  Location: [facility] ORS;  Service: Gynecology;  Laterality: N/A;  . THYROIDECTOMY, PARTIAL      Current Outpatient Medications  Medication Sig Dispense Refill  . blood glucose meter kit and supplies KIT Accu chek, Dx code E11.65,  check 3 times daily 1 each 0  . cloNIDine (CATAPRES) 0.2 MG tablet TAKE 1 TABLET (0.2 MG TOTAL) BY MOUTH 2 (TWO) TIMES DAILY. (Patient taking differently: Take 0.2 mg by mouth 3 (three) times daily. ) 180 tablet 3  . glucose blood ([employer] BLOOD GLUCOSE TEST) test strip USE TO CHECK CBG'S 3 TIMES DAILY. (Patient taking differently: USE TO CHECK CBG'S 3 TIMES DAILY. Guid test strips) 100 each 12  . insulin  NPH Human (HUMULIN N,NOVOLIN N) 100 UNIT/ML injection Inject 20-30 Units into the skin See admin instructions. Inject 30 units subcutaneously in the morning and 20 units in the evening    . insulin regular (NOVOLIN R) 100 units/mL injection INJECT 15 UNITS TOTAL INTO THE SKIN 3 (THREE) TIMES DAILY BEFORE MEALS. (Patient taking differently: Inject 15 Units into the skin 3 (three) times daily before meals. INJECT 15 UNITS TOTAL INTO THE SKIN 3 (THREE) TIMES DAILY BEFORE MEALS.) 43 mL 1  . Insulin Syringe-Needle U-100 30G X 1/2" 1 ML MISC 1 Device by Does not apply route 5 (five) times daily. (NPH 2x per day and as needed Novolin R 3x per day) 500 each 12  . Multiple Vitamin (MULTIVITAMIN WITH MINERALS) TABS tablet Take 1 tablet by mouth daily.    . mupirocin ointment (BACTROBAN) 2 % Apply 1 application topically 2 (two) times daily. Right index finger and left forearm 30 g 0  . olmesartan (BENICAR) 40 MG tablet Take 1 tablet (40 mg total) by mouth daily. 90 tablet 3  . rosuvastatin (CRESTOR) 10 MG tablet TAKE 1 TABLET (10 MG TOTAL) BY MOUTH DAILY. (Patient taking differently: Take 10 mg by mouth daily. TAKE 1 TABLET (10 MG TOTAL) BY MOUTH DAILY.) 90 tablet 3  . spironolactone (ALDACTONE) 25 MG tablet Take 1 tablet (25 mg total) by mouth daily. 90 tablet 3  . torsemide (DEMADEX) 20 MG tablet Take 1 tablet (20 mg total) by mouth 2 (two) times daily. 180 tablet 3   No current facility-administered medications for this visit.      Allergies:   Penicillins   Social History:  The patient  reports that she has never smoked. She has never used smokeless tobacco. She reports that she does not drink alcohol or use drugs.   Family History:   family history includes Diabetes in her mother, sister, and another family member; Heart disease in her sister; Hypertension in her mother; Stroke in her mother.    Review of Systems: Review of Systems  Constitutional: Negative.   Eyes: Negative.   Respiratory:  Negative.   Cardiovascular: Positive for leg swelling.  Gastrointestinal: Negative.   Genitourinary: Negative.   Musculoskeletal: Negative.   Neurological: Negative.   Psychiatric/Behavioral: Negative.   All other systems reviewed and are negative.    PHYSICAL EXAM: VS:  BP 122/76 (BP Location: Right Arm, Patient Position: Sitting, Cuff Size: Large)   Pulse 65   Ht '5\' 3"'$  (1.6 m)   Wt 261 lb (118.4 kg)   BMI 46.23 kg/m  , BMI Body mass index is 46.23 kg/m.  Constitutional:  oriented to person, place, and time. No distress.  Obese HENT:  Head: Grossly normal Eyes:  no discharge. No scleral icterus.  Neck: No JVD, no carotid bruits  Cardiovascular: Regular rate and rhythm, no murmurs appreciated, edema bilateral lower extremities.  Worse on the left than the right Pulmonary/Chest: Clear to auscultation bilaterally, no wheezes or rales Abdominal: Soft.  no distension.  no tenderness.  Musculoskeletal: Normal range  of motion Neurological:  normal muscle tone. Coordination normal. No atrophy Skin: Skin warm and dry Psychiatric: normal affect, pleasant   Recent Labs: 01/20/2018: TSH 1.86 06/24/2018: ALT 21 07/07/2018: BUN 23; Creatinine, Ser 1.25; Hemoglobin 13.2; Platelets 295; Potassium 4.8; Sodium 135    Lipid Panel Lab Results  Component Value Date   CHOL 141 01/20/2018   HDL 44.20 01/20/2018   LDLCALC 66 01/20/2018   TRIG 152.0 (H) 01/20/2018      Wt Readings from Last 3 Encounters:  08/21/18 261 lb (118.4 kg)  08/13/18 259 lb 12.8 oz (117.8 kg)  07/07/18 257 lb 15 oz (117 kg)       ASSESSMENT AND PLAN:  Essential hypertension, benign Plan: EKG 12-Lead Blood pressure is well controlled on today's visit. No changes made to the medications. Stable  Mixed hyperlipidemia Plan:  Cholesterol is at goal on the current lipid regimen. No changes to the medications were made.  Stable  Uncontrolled type 2 diabetes mellitus with stage 3 chronic kidney disease, with  long-term current use of insulin ([place]) Plan:  She is not taking medications prescribed by primary care Long discussion concerning her diet,  Feels she can do it on her own, She has made radical changes now down 20 pounds Complemented her on her dietary changes Again she does not want medications  CKD (chronic kidney disease) stage 3, GFR 30-59 ml/min Plan: Sees Dr. Candiss Norse Creatinine stable 1.3 On torsemide daily, does not need extra Stable nonpitting leg edema  Morbid obesity ([place]) Plan: Unable to exercise. Obesity hypoventilation  This may improve with continued weight loss Stressed importance of radical dietary changes  Chronic diastolic CHF (congestive heart failure) ([place]) Plan:  Recommended she take extra torsemide as needed for shortness of breath, ankle swelling.  Stable Renal function stable   Total encounter time more than 25 minutes  Greater than 50% was spent in counseling and coordination of care with the patient   Disposition:   F/U  12 months   Orders Placed This Encounter  Procedures  . EKG 12-Lead   I, Jesus Reyes am acting as a scribe for Ida Rogue, M.D., Ph.D.  I, Ida Rogue, M.D. Ph.D., have reviewed the above documentation for accuracy and completeness, and I agree with the above.    Signed, Esmond Plants, M.D., Ph.D. 08/21/2018  [place], [place]

## 2018-08-21 ENCOUNTER — Ambulatory Visit (INDEPENDENT_AMBULATORY_CARE_PROVIDER_SITE_OTHER): Payer: Medicare Other | Admitting: Cardiovascular Disease

## 2018-08-21 ENCOUNTER — Encounter: Payer: Self-pay | Admitting: Cardiovascular Disease

## 2018-08-21 VITALS — BP 122/76 | HR 65 | Ht 63.0 in | Wt 261.0 lb

## 2018-08-21 DIAGNOSIS — I5032 Chronic diastolic (congestive) heart failure: Secondary | ICD-10-CM | POA: Diagnosis not present

## 2018-08-21 DIAGNOSIS — I6521 Occlusion and stenosis of right carotid artery: Secondary | ICD-10-CM

## 2018-08-21 NOTE — Patient Instructions (Signed)
If weight continues to drop, We may need to cut the clonidine in 1/2 twice a day Down to 0.1 mg twice a day  Medication Instructions:  No changes  If you need a refill on your cardiac medications before your next appointment, please call your pharmacy.    Lab work: No new labs needed   If you have labs (blood work) drawn today and your tests are completely normal, you will receive your results only by: Marland Kitchen MyChart Message (if you have MyChart) OR . A paper copy in the mail If you have any lab test that is abnormal or we need to change your treatment, we will call you to review the results.   Testing/Procedures: No new testing needed   Follow-Up: At [facility], you and your health needs are our priority.  As part of our continuing mission to provide you with exceptional heart care, we have created designated Provider Care Teams.  These Care Teams include your primary Cardiologist (physician) and Advanced Practice Providers (APPs -  Physician Assistants and Nurse Practitioners) who all work together to provide you with the care you need, when you need it.  . You will need a follow up appointment in 12 months .   Please call our office 2 months in advance to schedule this appointment.    . Providers on your designated Care Team:   . Murray Hodgkins, NP . Christell Faith, PA-C . Marrianne Mood, PA-C  Any Other Special Instructions Will Be Listed Below (If Applicable).  For educational health videos Log in to : [email or web address] Or : [email or web address], password : [email or web address]

## 2018-08-22 ENCOUNTER — Encounter: Payer: Self-pay | Admitting: Internal Medicine

## 2018-09-02 ENCOUNTER — Other Ambulatory Visit: Payer: Self-pay | Admitting: Family Medicine

## 2018-09-02 NOTE — Telephone Encounter (Signed)
Patient says the pharmacy was also supposed Humulin R in addition to Novolin N. Dr. Aundra Dubin has not prescribed the Humulin R yet but this was discussed with the patient.

## 2018-09-02 NOTE — Telephone Encounter (Signed)
Patient is very upset to tears about her prescriptions for insulin not being sent correctly. She is now a patient of Dr. Karlyn Agee. She needs a refill on Humulin R and Novolin N. The pharmacy sent this request under Dr. Caryl Bis. Patient would like a call back from a nurse as soon as possible to discuss.

## 2018-09-03 ENCOUNTER — Other Ambulatory Visit: Payer: Self-pay | Admitting: Internal Medicine

## 2018-09-03 DIAGNOSIS — E1122 Type 2 diabetes mellitus with diabetic chronic kidney disease: Secondary | ICD-10-CM

## 2018-09-03 DIAGNOSIS — N183 Chronic kidney disease, stage 3 unspecified: Secondary | ICD-10-CM

## 2018-09-03 DIAGNOSIS — IMO0002 Reserved for concepts with insufficient information to code with codable children: Secondary | ICD-10-CM

## 2018-09-03 DIAGNOSIS — E1165 Type 2 diabetes mellitus with hyperglycemia: Principal | ICD-10-CM

## 2018-09-03 DIAGNOSIS — Z794 Long term (current) use of insulin: Principal | ICD-10-CM

## 2018-09-03 MED ORDER — INSULIN REGULAR HUMAN 100 UNIT/ML IJ SOLN: INTRAMUSCULAR | 11 refills | Status: DC

## 2018-09-03 MED ORDER — INSULIN NPH (HUMAN) (ISOPHANE) 100 UNIT/ML ~~LOC~~ SUSP: SUBCUTANEOUS | 11 refills | Status: DC

## 2018-09-03 NOTE — Telephone Encounter (Signed)
I have been out of the office sent her insulin just got request yesterday  Does she want Rx for JAnuvia?   [place]

## 2018-09-05 ENCOUNTER — Ambulatory Visit: Payer: Medicare Other | Admitting: Podiatry

## 2018-09-21 ENCOUNTER — Other Ambulatory Visit: Payer: Self-pay | Admitting: Family Medicine

## 2018-09-21 DIAGNOSIS — Z794 Long term (current) use of insulin: Principal | ICD-10-CM

## 2018-09-21 DIAGNOSIS — E1165 Type 2 diabetes mellitus with hyperglycemia: Principal | ICD-10-CM

## 2018-09-21 DIAGNOSIS — N183 Chronic kidney disease, stage 3 unspecified: Secondary | ICD-10-CM

## 2018-09-21 DIAGNOSIS — E1122 Type 2 diabetes mellitus with diabetic chronic kidney disease: Secondary | ICD-10-CM

## 2018-09-21 DIAGNOSIS — IMO0002 Reserved for concepts with insufficient information to code with codable children: Secondary | ICD-10-CM

## 2018-09-22 ENCOUNTER — Telehealth: Payer: Self-pay | Admitting: Family Medicine

## 2018-09-22 ENCOUNTER — Telehealth: Payer: Self-pay | Admitting: Internal Medicine

## 2018-09-22 NOTE — Telephone Encounter (Signed)
Patient stated she is waiting till pandemic is over

## 2018-09-22 NOTE — Telephone Encounter (Signed)
No follow up scheduled for patient  Needs follow up in next few months  Due for:  Labs, A1c  Tdap  DEXA scan  Pneumonia vaccine  Foot exam

## 2018-09-22 NOTE — Telephone Encounter (Signed)
Copied from [place] [id number]. Topic: Quick Communication - Rx Refill/Question >> Sep 22, 2018  9:04 AM Robina Ade, Helene Kelp D wrote: Medication: insulin regular (NOVOLIN R) 100 units/mL injection  Has the patient contacted their pharmacy? Yes (Agent: If no, request that the patient contact the pharmacy for the refill.) (Agent: If yes, when and what did the pharmacy advise?)  Preferred Pharmacy (with phone number or street name): [id number] - [place], [place].  Agent: Please be advised that RX refills may take up to 3 business days. We ask that you follow-up with your pharmacy.

## 2018-10-21 ENCOUNTER — Telehealth: Payer: Self-pay

## 2018-10-21 ENCOUNTER — Other Ambulatory Visit: Payer: Self-pay | Admitting: Internal Medicine

## 2018-10-21 DIAGNOSIS — N3000 Acute cystitis without hematuria: Secondary | ICD-10-CM

## 2018-10-21 NOTE — Telephone Encounter (Signed)
Copied from [employer] [id number]. Topic: General - Other >> Oct 21, 2018  9:20 AM Celene Kras A wrote: Reason for CRM: Pt called stating she thinks she has bladder infection. Pt states this has been going on for 3-4 weeks. Pt states she is having pain in her lower stomach, pain while urinating, and urinating more frequently. Pt does not have the ability to have a virtual visit and is requesting that a medication be sent in for her. Please advise.

## 2018-10-21 NOTE — Telephone Encounter (Signed)
We can not send in medication w/o urine sample and appt via telephone   [place]

## 2018-10-21 NOTE — Telephone Encounter (Signed)
Appointment and lab appointment scheduled. Please place lab orders.

## 2018-10-22 ENCOUNTER — Ambulatory Visit (INDEPENDENT_AMBULATORY_CARE_PROVIDER_SITE_OTHER): Payer: Medicare Other | Admitting: Internal Medicine

## 2018-10-22 ENCOUNTER — Other Ambulatory Visit: Payer: Medicare Other

## 2018-10-22 ENCOUNTER — Other Ambulatory Visit: Payer: Self-pay

## 2018-10-22 DIAGNOSIS — N3000 Acute cystitis without hematuria: Secondary | ICD-10-CM

## 2018-10-22 DIAGNOSIS — E1122 Type 2 diabetes mellitus with diabetic chronic kidney disease: Secondary | ICD-10-CM

## 2018-10-22 DIAGNOSIS — N183 Chronic kidney disease, stage 3 unspecified: Secondary | ICD-10-CM

## 2018-10-22 MED ORDER — CIPROFLOXACIN HCL 500 MG PO TABS: 500.0000 mg | ORAL_TABLET | Freq: Two times a day (BID) | ORAL | 0 refills | Status: DC

## 2018-10-22 NOTE — Progress Notes (Signed)
Telephone Note  I connected with Alexis Farmer  on 10/22/18 at 11:35 AM EDT by telephone and verified that I am speaking with the correct person using two identifiers.  Location patient: home Location provider:work  Persons participating in the virtual visit: patient, provider  I discussed the limitations of evaluation and management by telemedicine and the availability of in person appointments. The patient expressed understanding and agreed to proceed.   HPI: 1. C/o vaginal burning with urination, odor, reduced urination as well x 2-3 weeks drinking 1 gallon of water and tried cranberry juice w/o relief  2. DM 2 last A1C 06/24/2018 with CKD 3    ROS: See pertinent positives and negatives per HPI.  Past Medical History:  Diagnosis Date  . Arthritis   . Chickenpox   . Diabetes mellitus without complication ([place])    one elevated reading/ no treatment  . Diverticulitis   . GI bleed   . High cholesterol   . History of blood transfusion   . Hypertension   . Renal insufficiency     Past Surgical History:  Procedure Laterality Date  . APPENDECTOMY    . CHOLECYSTECTOMY    . ECTOPIC PREGNANCY SURGERY    . EYE SURGERY     bilateral cataracts  . gallbladder     . HYSTEROSCOPY W/D&C N/A 10/26/2016   Procedure: DILATATION AND CURETTAGE /HYSTEROSCOPY;  Surgeon: Benjaman Kindler, MD;  Location: [facility] ORS;  Service: Gynecology;  Laterality: N/A;  . HYSTEROSCOPY W/D&C N/A 07/07/2018   Procedure: DILATATION AND CURETTAGE /HYSTEROSCOPY;  Surgeon: Benjaman Kindler, MD;  Location: [facility] OR[facility];  Service: Gynecology;  Laterality: N/A;  . THYROIDECTOMY, PARTIAL      Family History  Problem Relation Age of Onset  . Diabetes Mother   . Hypertension Mother   . Stroke Mother   . Diabetes Other   . Diabetes Sister   . Heart disease Sister     SOCIAL HX: lives alone   Current Outpatient Medications:  .  blood glucose meter kit and supplies KIT, Accu chek, Dx code E11.65, check 3 times  daily, Disp: 1 each, Rfl: 0 .  ciprofloxacin (CIPRO) 500 MG tablet, Take 1 tablet (500 mg total) by mouth 2 (two) times daily. With food, Disp: 10 tablet, Rfl: 0 .  cloNIDine (CATAPRES) 0.2 MG tablet, TAKE 1 TABLET (0.2 MG TOTAL) BY MOUTH 2 (TWO) TIMES DAILY. (Patient taking differently: Take 0.2 mg by mouth 3 (three) times daily. ), Disp: 180 tablet, Rfl: 3 .  glucose blood ([employer] BLOOD GLUCOSE TEST) test strip, USE TO CHECK CBG'S 3 TIMES DAILY. (Patient taking differently: USE TO CHECK CBG'S 3 TIMES DAILY. Guid test strips), Disp: 100 each, Rfl: 12 .  insulin NPH Human (NOVOLIN N) 100 UNIT/ML injection, INJECT 25 UNITS IN THE MORNING AND 15-20 UNITS IN THE EVENING BEFORE A MEAL, Disp: 40 mL, Rfl: 0 .  insulin regular (NOVOLIN R) 100 units/mL injection, INJECT 15 UNITS TOTAL INTO THE SKIN 3 (THREE) TIMES DAILY BEFORE MEALS., Disp: 43 mL, Rfl: 11 .  Insulin Syringe-Needle U-100 30G X 1/2" 1 ML MISC, 1 Device by Does not apply route 5 (five) times daily. (NPH 2x per day and as needed Novolin R 3x per day), Disp: 500 each, Rfl: 12 .  Multiple Vitamin (MULTIVITAMIN WITH MINERALS) TABS tablet, Take 1 tablet by mouth daily., Disp: , Rfl:  .  mupirocin ointment (BACTROBAN) 2 %, Apply 1 application topically 2 (two) times daily. Right index finger and left forearm, Disp: 30  g, Rfl: 0 .  olmesartan (BENICAR) 40 MG tablet, Take 1 tablet (40 mg total) by mouth daily., Disp: 90 tablet, Rfl: 3 .  rosuvastatin (CRESTOR) 10 MG tablet, TAKE 1 TABLET (10 MG TOTAL) BY MOUTH DAILY. (Patient taking differently: Take 10 mg by mouth daily. TAKE 1 TABLET (10 MG TOTAL) BY MOUTH DAILY.), Disp: 90 tablet, Rfl: 3 .  spironolactone (ALDACTONE) 25 MG tablet, Take 1 tablet (25 mg total) by mouth daily., Disp: 90 tablet, Rfl: 3 .  torsemide (DEMADEX) 20 MG tablet, Take 1 tablet (20 mg total) by mouth 2 (two) times daily., Disp: 180 tablet, Rfl: 3  EXAM:  VITALS per patient if applicable:  GENERAL: alert, oriented, appears well  and in no acute distress  PSYCH/NEURO: pleasant and cooperative, no obvious depression or anxiety, speech and thought processing grossly intact  ASSESSMENT AND PLAN:  Discussed the following assessment and plan:  Acute cystitis without hematuria - Plan: ciprofloxacin (CIPRO) 500 MG tablet, CBC w/Diff  Type 2 diabetes mellitus with stage 3 chronic kidney disease, without long-term current use of insulin ([place]) - Plan: Comprehensive metabolic panel, CBC w/Diff, Hemoglobin A1c, Lipid panel bid x 5 days   UA and culture submitted if negative will w/u with labs    HM Flu shot utd  Check on other vaccines (I.e Tdap, prevnar, pna 23, shingrix)   mammo had 08/17/17 normal [facility] per pt has upcoming sch Pap had 05/28/18 negative [facility] s/p D&C 2020 for post menopausal bleeding DEXA pt declines for now  Colonoscopy Dr. Allen Norris in [place] -09/10/14 diverticulosis sessile polyp Consider check hep Cthough not in age window CT chest never smoker   02/03/18 thyroid bx benign bx left middle and lower thyroid  Saw renal 03/17/18 CKD 3 2/2 diabetes BP elevated 139/68,142/84 considering ACEI/ARB f/u in 6 months Dr. Candiss Norse    I discussed the assessment and treatment plan with the patient. The patient was provided an opportunity to ask questions and all were answered. The patient agreed with the plan and demonstrated an understanding of the instructions.   The patient was advised to call back or seek an in-person evaluation if the symptoms worsen or if the condition fails to improve as anticipated.  Time spent 20 minutes  Delorise Jackson, MD

## 2018-10-22 NOTE — Addendum Note (Signed)
Addended by: Leeanne Rio on: 10/22/2018 08:44 AM   Modules accepted: Orders

## 2018-10-23 ENCOUNTER — Telehealth: Payer: Self-pay | Admitting: Internal Medicine

## 2018-10-23 ENCOUNTER — Telehealth: Payer: Self-pay

## 2018-10-23 LAB — URINALYSIS, ROUTINE W REFLEX MICROSCOPIC
Bilirubin, UA: NEGATIVE
Ketones, UA: NEGATIVE
Leukocytes,UA: NEGATIVE
Nitrite, UA: NEGATIVE
Protein,UA: NEGATIVE
RBC, UA: NEGATIVE
Specific Gravity, UA: 1.006 (ref 1.005–1.030)
Urobilinogen, Ur: 0.2 mg/dL (ref 0.2–1.0)
pH, UA: 5 (ref 5.0–7.5)

## 2018-10-23 NOTE — Telephone Encounter (Signed)
I set up fasting labs for 10/28/2018 and also set up 53m follow up. Pt wants to know if she can have her thyroid and kidney functions checked with her lab.

## 2018-10-23 NOTE — Telephone Encounter (Signed)
Left message for patient to return call back. [facility] may give and obtain information.  

## 2018-10-23 NOTE — Telephone Encounter (Signed)
-----   Message from Delorise Jackson, MD sent at 10/22/2018 12:11 PM EDT ----- sch fasting labs when able pt to wear mask and f/u in 4 months

## 2018-10-23 NOTE — Telephone Encounter (Signed)
We are checking kidney function Thyroid function her insurance does not really cover this and we checked 01/2018 and was normall    Thanks and inform pt   [place]

## 2018-10-24 LAB — URINE CULTURE

## 2018-10-24 NOTE — Telephone Encounter (Signed)
Patient has been notified

## 2018-10-28 ENCOUNTER — Telehealth: Payer: Self-pay | Admitting: *Deleted

## 2018-10-28 ENCOUNTER — Other Ambulatory Visit: Payer: Self-pay

## 2018-10-28 ENCOUNTER — Other Ambulatory Visit (INDEPENDENT_AMBULATORY_CARE_PROVIDER_SITE_OTHER): Payer: Medicare Other

## 2018-10-28 DIAGNOSIS — N183 Chronic kidney disease, stage 3 unspecified: Secondary | ICD-10-CM

## 2018-10-28 DIAGNOSIS — E1122 Type 2 diabetes mellitus with diabetic chronic kidney disease: Secondary | ICD-10-CM

## 2018-10-28 DIAGNOSIS — N3000 Acute cystitis without hematuria: Secondary | ICD-10-CM | POA: Diagnosis not present

## 2018-10-28 LAB — COMPREHENSIVE METABOLIC PANEL
ALT: 19 U/L (ref 0–35)
AST: 22 U/L (ref 0–37)
Albumin: 4.4 g/dL (ref 3.5–5.2)
Alkaline Phosphatase: 50 U/L (ref 39–117)
BUN: 31 mg/dL — ABNORMAL HIGH (ref 6–23)
CO2: 30 mEq/L (ref 19–32)
Calcium: 11.1 mg/dL — ABNORMAL HIGH (ref 8.4–10.5)
Chloride: 95 mEq/L — ABNORMAL LOW (ref 96–112)
Creatinine, Ser: 1.93 mg/dL — ABNORMAL HIGH (ref 0.40–1.20)
GFR: 30.24 mL/min — ABNORMAL LOW (ref >60.00)
Glucose, Bld: 220 mg/dL — ABNORMAL HIGH (ref 70–99)
Potassium: 5 mEq/L (ref 3.5–5.1)
Sodium: 133 mEq/L — ABNORMAL LOW (ref 135–145)
Total Bilirubin: 0.6 mg/dL (ref 0.2–1.2)
Total Protein: 7.2 g/dL (ref 6.0–8.3)

## 2018-10-28 LAB — CBC WITH DIFFERENTIAL/PLATELET
Basophils Absolute: 0.1 10*3/uL (ref 0.0–0.1)
Basophils Relative: 1.5 % (ref 0.0–3.0)
Eosinophils Absolute: 0.2 10*3/uL (ref 0.0–0.7)
Eosinophils Relative: 3.4 % (ref 0.0–5.0)
HCT: 38.9 % (ref 36.0–46.0)
Hemoglobin: 13 g/dL (ref 12.0–15.0)
Lymphocytes Relative: 51 % — ABNORMAL HIGH (ref 12.0–46.0)
Lymphs Abs: 3.7 10*3/uL (ref 0.7–4.0)
MCHC: 33.6 g/dL (ref 30.0–36.0)
MCV: 89 fl (ref 78.0–100.0)
Monocytes Absolute: 0.6 10*3/uL (ref 0.1–1.0)
Monocytes Relative: 8.2 % (ref 3.0–12.0)
Neutro Abs: 2.6 10*3/uL (ref 1.4–7.7)
Neutrophils Relative %: 35.9 % — ABNORMAL LOW (ref 43.0–77.0)
Platelets: 230 10*3/uL (ref 150.0–400.0)
RBC: 4.37 Mil/uL (ref 3.87–5.11)
RDW: 13.2 % (ref 11.5–15.5)
WBC: 7.3 10*3/uL (ref 4.0–10.5)

## 2018-10-28 LAB — LIPID PANEL
Cholesterol: 187 mg/dL (ref 0–200)
HDL: 42 mg/dL (ref >39.00)
NonHDL: 145.13
Total CHOL/HDL Ratio: 4
Triglycerides: 327 mg/dL — ABNORMAL HIGH (ref 0.0–149.0)
VLDL: 65.4 mg/dL — ABNORMAL HIGH (ref 0.0–40.0)

## 2018-10-28 LAB — HEMOGLOBIN A1C: Hgb A1c MFr Bld: 10.6 % — ABNORMAL HIGH (ref 4.6–6.5)

## 2018-10-28 LAB — LDL CHOLESTEROL, DIRECT: Direct LDL: 96 mg/dL

## 2018-10-28 NOTE — Telephone Encounter (Signed)
Copied from [place] [id number]. Topic: Quick Communication - Lab Results (Clinic Use ONLY) >> Oct 24, 2018  2:06 PM Babs Bertin, CMA wrote: Called patient to inform them of 10/24/2018 lab results. When patient returns call, triage nurse may disclose results. >> Oct 28, 2018  3:22 PM Keene Breath wrote: Patient is returning a call regarding labs.  Nurse was not available.  Please call patient back at [phone]

## 2018-10-29 ENCOUNTER — Encounter: Payer: Self-pay | Admitting: Internal Medicine

## 2018-10-29 ENCOUNTER — Ambulatory Visit (INDEPENDENT_AMBULATORY_CARE_PROVIDER_SITE_OTHER): Payer: Medicare Other | Admitting: Internal Medicine

## 2018-10-29 ENCOUNTER — Other Ambulatory Visit: Payer: Self-pay | Admitting: Internal Medicine

## 2018-10-29 DIAGNOSIS — R300 Dysuria: Secondary | ICD-10-CM

## 2018-10-29 DIAGNOSIS — E785 Hyperlipidemia, unspecified: Secondary | ICD-10-CM

## 2018-10-29 DIAGNOSIS — E1165 Type 2 diabetes mellitus with hyperglycemia: Secondary | ICD-10-CM

## 2018-10-29 DIAGNOSIS — N179 Acute kidney failure, unspecified: Secondary | ICD-10-CM

## 2018-10-29 DIAGNOSIS — N183 Chronic kidney disease, stage 3 unspecified: Secondary | ICD-10-CM

## 2018-10-29 MED ORDER — EZETIMIBE 10 MG PO TABS: 10.0000 mg | ORAL_TABLET | Freq: Every day | ORAL | 3 refills | Status: DC

## 2018-10-29 MED ORDER — SITAGLIPTIN PHOSPHATE 50 MG PO TABS: 50.0000 mg | ORAL_TABLET | Freq: Every day | ORAL | 3 refills | Status: DC

## 2018-10-29 NOTE — Progress Notes (Signed)
Labs have been faxed.

## 2018-10-29 NOTE — Progress Notes (Signed)
Telephone Note  I connected with Alexis Farmer   on 10/29/18 at  2:50 PM EDT by a telephone and verified that I am speaking with the correct person using two identifiers.  Location patient: home Location provider:work  Persons participating in the virtual visit: patient, provider, pts grand daughter   I discussed the limitations of evaluation and management by telemedicine and the availability of in person appointments. The patient expressed understanding and agreed to proceed.   HPI: 1. DM 2 A1C 10.6 10/28/2018 pts takes novolin R 15 tid but currently checked blood sugar and was 447 at 2:50 pm she only has had regular ensure to drink for breakfast   She took 5 extra units of novolin R=20 units for now. She reports she took novolin N 30 units this am though prev she was taking 25 units in am as of 06/2018 appt and 20 units In the pm. She tried Januvia 50 mg sample today and thinks this caused her sugar to go up but this is not side effect of the medication   Due to COVID she does not want to see endocrine at the time but will reconsider in future   2. AKI on CKD 3 will repeat labs in 2 weeks to w/u established with [facility] [place] will check ANA, SPEP, UPEP, bmet in 2 weeks    ROS: See pertinent positives and negatives per HPI.  Past Medical History:  Diagnosis Date  . Arthritis   . Chickenpox   . Diabetes mellitus without complication ([place])    one elevated reading/ no treatment  . Diverticulitis   . GI bleed   . High cholesterol   . History of blood transfusion   . Hypertension   . Renal insufficiency     Past Surgical History:  Procedure Laterality Date  . APPENDECTOMY    . CHOLECYSTECTOMY    . ECTOPIC PREGNANCY SURGERY    . EYE SURGERY     bilateral cataracts  . gallbladder     . HYSTEROSCOPY W/D&C N/A 10/26/2016   Procedure: DILATATION AND CURETTAGE /HYSTEROSCOPY;  Surgeon: Benjaman Kindler, MD;  Location: [facility] ORS;  Service: Gynecology;  Laterality: N/A;  .  HYSTEROSCOPY W/D&C N/A 07/07/2018   Procedure: DILATATION AND CURETTAGE /HYSTEROSCOPY;  Surgeon: Benjaman Kindler, MD;  Location: [facility] ORS;  Service: Gynecology;  Laterality: N/A;  . THYROIDECTOMY, PARTIAL      Family History  Problem Relation Age of Onset  . Diabetes Mother   . Hypertension Mother   . Stroke Mother   . Diabetes Other   . Diabetes Sister   . Heart disease Sister     SOCIAL HX: lives home    Current Outpatient Medications:  .  blood glucose meter kit and supplies KIT, Accu chek, Dx code E11.65, check 3 times daily, Disp: 1 each, Rfl: 0 .  ciprofloxacin (CIPRO) 500 MG tablet, Take 1 tablet (500 mg total) by mouth 2 (two) times daily. With food, Disp: 10 tablet, Rfl: 0 .  cloNIDine (CATAPRES) 0.2 MG tablet, TAKE 1 TABLET (0.2 MG TOTAL) BY MOUTH 2 (TWO) TIMES DAILY. (Patient taking differently: Take 0.2 mg by mouth 3 (three) times daily. ), Disp: 180 tablet, Rfl: 3 .  ezetimibe (ZETIA) 10 MG tablet, Take 1 tablet (10 mg total) by mouth daily., Disp: 90 tablet, Rfl: 3 .  glucose blood ([employer] BLOOD GLUCOSE TEST) test strip, USE TO CHECK CBG'S 3 TIMES DAILY. (Patient taking differently: USE TO CHECK CBG'S 3 TIMES DAILY. Guid test strips),  Disp: 100 each, Rfl: 12 .  insulin NPH Human (NOVOLIN N) 100 UNIT/ML injection, INJECT 25 UNITS IN THE MORNING AND 15-20 UNITS IN THE EVENING BEFORE A MEAL, Disp: 40 mL, Rfl: 0 .  insulin regular (NOVOLIN R) 100 units/mL injection, INJECT 15 UNITS TOTAL INTO THE SKIN 3 (THREE) TIMES DAILY BEFORE MEALS., Disp: 43 mL, Rfl: 11 .  Insulin Syringe-Needle U-100 30G X 1/2" 1 ML MISC, 1 Device by Does not apply route 5 (five) times daily. (NPH 2x per day and as needed Novolin R 3x per day), Disp: 500 each, Rfl: 12 .  Multiple Vitamin (MULTIVITAMIN WITH MINERALS) TABS tablet, Take 1 tablet by mouth daily., Disp: , Rfl:  .  mupirocin ointment (BACTROBAN) 2 %, Apply 1 application topically 2 (two) times daily. Right index finger and left forearm, Disp: 30  g, Rfl: 0 .  olmesartan (BENICAR) 40 MG tablet, Take 1 tablet (40 mg total) by mouth daily., Disp: 90 tablet, Rfl: 3 .  rosuvastatin (CRESTOR) 10 MG tablet, TAKE 1 TABLET (10 MG TOTAL) BY MOUTH DAILY. (Patient taking differently: Take 10 mg by mouth daily. TAKE 1 TABLET (10 MG TOTAL) BY MOUTH DAILY.), Disp: 90 tablet, Rfl: 3 .  sitaGLIPtin (JANUVIA) 50 MG tablet, Take 1 tablet (50 mg total) by mouth daily. Do not cut in 1/2 take 1 pill daily, Disp: 90 tablet, Rfl: 3 .  spironolactone (ALDACTONE) 25 MG tablet, Take 1 tablet (25 mg total) by mouth daily., Disp: 90 tablet, Rfl: 3 .  torsemide (DEMADEX) 20 MG tablet, Take 1 tablet (20 mg total) by mouth 2 (two) times daily., Disp: 180 tablet, Rfl: 3  EXAM:  VITALS per patient if applicable:  GENERAL: alert, oriented, appears well and in no acute distress  HEENT: atraumatic, conjunttiva clear, no obvious abnormalities on inspection of external nose and ears  NECK: normal movements of the head and neck  LUNGS: on inspection no signs of respiratory distress, breathing rate appears normal, no obvious gross SOB, gasping or wheezing  CV: no obvious cyanosis  MS: moves all visible extremities without noticeable abnormality  PSYCH/NEURO: pleasant and cooperative, no obvious depression or anxiety, speech and thought processing grossly intact  ASSESSMENT AND PLAN:  Discussed the following assessment and plan:  AKI (acute kidney injury) ([place]) - Plan: Antinuclear Antib (ANA), Protein Electrophoresis, (serum), Protein Electrophoresis, Urine Rflx., Basic Metabolic Panel (BMET), Sodium, urine, random, Microalbumin / creatinine urine ratio  CKD (chronic kidney disease) stage 3, GFR 30-59 ml/min ([facility]) - Plan: Antinuclear Antib (ANA), Protein Electrophoresis, (serum), Protein Electrophoresis, Urine Rflx., Basic Metabolic Panel (BMET), Sodium, urine, random, Microalbumin / creatinine urine ratio  Hypercalcemia - Plan: Basic Metabolic Panel (BMET) r/o MM  spep/upep stop extra calcium otc   Uncontrolled type 2 diabetes mellitus with hyperglycemia ([place]) - Plan: Sodium, urine, random, Microalbumin / creatinine urine ratio -pt to come in labs in 2 weeks  -given samples ensure max with 1 g sugar and rec do not take regular ensure  -for now with cbg 447 at 2:50 pm will have her take additional 5 units of novolin R to total 25 units and check her blood sugar in 1 hr regular dose is 15 tid and novolin N on 25-30 units in the am and 20 units pm  -she is now hesistant to see endocrine for consult due to COVID 19 which is rec and also c/w Januvia 50 mg causing cbg though I dont think this was the medication causing hyperglycemia I think this is related  to diet choices   Call pt tomorrow to check cbgs     I discussed the assessment and treatment plan with the patient. The patient was provided an opportunity to ask questions and all were answered. The patient agreed with the plan and demonstrated an understanding of the instructions.   The patient was advised to call back or seek an in-person evaluation if the symptoms worsen or if the condition fails to improve as anticipated.  Time spent 5 minutes  Delorise [place], MD

## 2018-10-29 NOTE — Progress Notes (Signed)
Pre visit review using our clinic review tool, if applicable. No additional management support is needed unless otherwise documented below in the visit note. 

## 2018-10-30 NOTE — Addendum Note (Signed)
Addended by: Orland Mustard on: 10/30/2018 09:13 AM   Modules accepted: Orders

## 2018-11-06 ENCOUNTER — Encounter: Payer: Self-pay | Admitting: Internal Medicine

## 2018-11-06 ENCOUNTER — Telehealth: Payer: Self-pay

## 2018-11-06 NOTE — Telephone Encounter (Signed)
Novolin N 20 units bid will take due low cbgs was on 25-30 units In the am and 15-20 units qhs ON januvia 50 mg but due to low blood sugars will cut pill in 1/2 =25 mg qd  She reports not eating with insulin injections which I rec she should eat with insulin   Novolin R was on 15 units tid but she wants to hold this for now due to low cbgs 68-70  She will call back tomorrow with blood sugar readings  Again encouraged she see endocrine to consider changing her insulin regimen I.e tresiba and SSI instead of NPH N and NPH R   She will call back tomorrow with glucose reading    [place]

## 2018-11-06 NOTE — Telephone Encounter (Signed)
Copied from [place] [id number]. Topic: General - Inquiry >> Nov 06, 2018  9:04 AM Richardo Priest, NT wrote: Reason for CRM: Patient is calling in stating she would like to discuss with Dr.McLean-Scocuzza or her nurse her Januvia medication. Call back is [phone]. Patient states her sugars are low and then she has to have something sweet to balance it out. Last night 70. Other morning 68, 69. It was 68 in the morning. Please advise.

## 2018-11-12 ENCOUNTER — Other Ambulatory Visit: Payer: Medicare Other

## 2018-11-13 ENCOUNTER — Other Ambulatory Visit: Payer: Self-pay | Admitting: Internal Medicine

## 2018-11-13 ENCOUNTER — Telehealth: Payer: Self-pay | Admitting: Internal Medicine

## 2018-11-13 MED ORDER — OLMESARTAN MEDOXOMIL 40 MG PO TABS: 40.0000 mg | ORAL_TABLET | Freq: Every day | ORAL | 3 refills | Status: DC

## 2018-11-13 MED ORDER — SPIRONOLACTONE 25 MG PO TABS: 25.0000 mg | ORAL_TABLET | Freq: Every day | ORAL | 3 refills | Status: DC

## 2018-11-13 NOTE — Telephone Encounter (Unsigned)
Copied from [place] [id number]. Topic: Quick Communication - Rx Refill/Question >> Nov 13, 2018 10:04 AM Yvette Rack wrote: Medication: olmesartan (BENICAR) 40 MG tablet  and  spironolactone (ALDACTONE) 25 MG tablet  Has the patient contacted their pharmacy? yes   Preferred Pharmacy (with phone number or street name): [id number] [address], [place]. [phone] (Phone)  [phone] (Fax)  Agent: Please be advised that RX refills may take up to 3 business days. We ask that you follow-up with your pharmacy.

## 2018-11-18 ENCOUNTER — Telehealth: Payer: Self-pay

## 2018-11-18 NOTE — Telephone Encounter (Signed)
Copied from [place] [id number]. Topic: General - Other >> Nov 18, 2018  7:23 AM Carolyn Stare wrote:  Pt would like a an appt for vagina irritation

## 2018-11-19 ENCOUNTER — Other Ambulatory Visit (INDEPENDENT_AMBULATORY_CARE_PROVIDER_SITE_OTHER): Payer: Medicare Other

## 2018-11-19 ENCOUNTER — Other Ambulatory Visit: Payer: Self-pay

## 2018-11-19 DIAGNOSIS — N179 Acute kidney failure, unspecified: Secondary | ICD-10-CM | POA: Diagnosis not present

## 2018-11-19 DIAGNOSIS — N183 Chronic kidney disease, stage 3 unspecified: Secondary | ICD-10-CM

## 2018-11-19 DIAGNOSIS — E1165 Type 2 diabetes mellitus with hyperglycemia: Secondary | ICD-10-CM

## 2018-11-19 DIAGNOSIS — R300 Dysuria: Secondary | ICD-10-CM | POA: Diagnosis not present

## 2018-11-19 LAB — BASIC METABOLIC PANEL
BUN: 17 mg/dL (ref 6–23)
CO2: 27 mEq/L (ref 19–32)
Calcium: 10.1 mg/dL (ref 8.4–10.5)
Chloride: 101 mEq/L (ref 96–112)
Creatinine, Ser: 1.14 mg/dL (ref 0.40–1.20)
GFR: 55.51 mL/min — ABNORMAL LOW (ref >60.00)
Glucose, Bld: 112 mg/dL — ABNORMAL HIGH (ref 70–99)
Potassium: 4.4 mEq/L (ref 3.5–5.1)
Sodium: 136 mEq/L (ref 135–145)

## 2018-11-20 LAB — URINE CULTURE
MICRO NUMBER:: 532953
Result:: NO GROWTH
SPECIMEN QUALITY:: ADEQUATE

## 2018-11-20 LAB — PROTEIN ELECTROPHORESIS, SERUM

## 2018-11-20 LAB — PROTEIN ELECTROPHORESIS, URINE REFLEX

## 2018-11-21 LAB — PROTEIN ELECTROPHORESIS, SERUM
A/G Ratio: 1.3 (ref 0.7–1.7)
Albumin ELP: 4 g/dL (ref 2.9–4.4)
Alpha 1: 0.2 g/dL (ref 0.0–0.4)
Alpha 2: 0.8 g/dL (ref 0.4–1.0)
Beta: 1.1 g/dL (ref 0.7–1.3)
Gamma Globulin: 1.1 g/dL (ref 0.4–1.8)
Globulin, Total: 3.2 g/dL (ref 2.2–3.9)
Total Protein: 7.2 g/dL (ref 6.0–8.5)

## 2018-11-21 LAB — MICROALBUMIN / CREATININE URINE RATIO
Creatinine, Urine: 98.2 mg/dL
Microalb/Creat Ratio: 16 mg/g creat (ref 0–29)
Microalbumin, Urine: 15.6 ug/mL

## 2018-11-21 LAB — PROTEIN ELECTROPHORESIS, URINE REFLEX
Albumin ELP, Urine: 38.7 %
Alpha-1-Globulin, U: 3.3 %
Alpha-2-Globulin, U: 13.3 %
Beta Globulin, U: 24.8 %
Gamma Globulin, U: 19.9 %
Protein, Ur: 12.2 mg/dL

## 2018-11-21 LAB — ANA: Anti Nuclear Antibody (ANA): NEGATIVE

## 2018-11-21 LAB — SODIUM, URINE, RANDOM: Sodium, Ur: 25 mmol/L

## 2018-11-24 ENCOUNTER — Other Ambulatory Visit: Payer: Self-pay | Admitting: Internal Medicine

## 2018-11-24 DIAGNOSIS — E1165 Type 2 diabetes mellitus with hyperglycemia: Secondary | ICD-10-CM

## 2018-11-24 MED ORDER — SITAGLIPTIN PHOSPHATE 50 MG PO TABS: 25.0000 mg | ORAL_TABLET | Freq: Every day | ORAL | 3 refills | Status: DC

## 2018-11-27 ENCOUNTER — Other Ambulatory Visit: Payer: Self-pay

## 2018-11-27 DIAGNOSIS — E1165 Type 2 diabetes mellitus with hyperglycemia: Secondary | ICD-10-CM

## 2018-11-27 MED ORDER — KROGER BLOOD GLUCOSE TEST VI STRP: ORAL_STRIP | 12 refills | Status: DC

## 2018-11-28 ENCOUNTER — Other Ambulatory Visit: Payer: Self-pay

## 2018-11-28 ENCOUNTER — Telehealth: Payer: Self-pay | Admitting: Internal Medicine

## 2018-11-28 DIAGNOSIS — E1165 Type 2 diabetes mellitus with hyperglycemia: Secondary | ICD-10-CM

## 2018-11-28 MED ORDER — KROGER BLOOD GLUCOSE TEST VI STRP: ORAL_STRIP | 12 refills | Status: DC

## 2018-11-28 NOTE — Telephone Encounter (Signed)
Pt called in and stated that she has not received her glucose blood ([employer] BLOOD GLUCOSE TEST) test strip. Looks like they were ordered on 11/27/2018. [facility] says [facility] not [facility] pres[facility].

## 2018-11-28 NOTE — Telephone Encounter (Signed)
Spoken to patient. Giving rx to granddaughter to give the rx to her.

## 2018-12-01 ENCOUNTER — Ambulatory Visit: Payer: Medicare Other | Admitting: Orthotics

## 2018-12-01 ENCOUNTER — Other Ambulatory Visit: Payer: Self-pay

## 2018-12-01 ENCOUNTER — Ambulatory Visit (INDEPENDENT_AMBULATORY_CARE_PROVIDER_SITE_OTHER): Payer: Medicare Other | Admitting: Podiatry

## 2018-12-01 ENCOUNTER — Encounter: Payer: Self-pay | Admitting: Podiatry

## 2018-12-01 VITALS — Temp 97.8°F

## 2018-12-01 DIAGNOSIS — M79675 Pain in left toe(s): Secondary | ICD-10-CM | POA: Diagnosis not present

## 2018-12-01 DIAGNOSIS — E1142 Type 2 diabetes mellitus with diabetic polyneuropathy: Secondary | ICD-10-CM

## 2018-12-01 DIAGNOSIS — B351 Tinea unguium: Secondary | ICD-10-CM

## 2018-12-01 DIAGNOSIS — M79674 Pain in right toe(s): Secondary | ICD-10-CM

## 2018-12-01 DIAGNOSIS — E1165 Type 2 diabetes mellitus with hyperglycemia: Secondary | ICD-10-CM

## 2018-12-01 DIAGNOSIS — L84 Corns and callosities: Secondary | ICD-10-CM

## 2018-12-01 DIAGNOSIS — IMO0001 Reserved for inherently not codable concepts without codable children: Secondary | ICD-10-CM

## 2018-12-01 DIAGNOSIS — Z794 Long term (current) use of insulin: Secondary | ICD-10-CM

## 2018-12-01 NOTE — Progress Notes (Signed)
Patient presents today for diabetic shoe measurement and foam casting.  Goals of diabetic shoes/inserts to offer protection from conditions secondary to DM2, offer relief from sheer forces that could lead to ulcerations, protect the foot, and offer greater stability. Patient is under supervision of DPM Physician managing patients DM2: Patient has following documented conditions to qualify for diabetic shoes/inserts: Patient measured with brannock device: 

## 2018-12-01 NOTE — Patient Instructions (Signed)
Diabetes Mellitus and Foot Care Foot care is an important part of your health, especially when you have diabetes. Diabetes may cause you to have problems because of poor blood flow (circulation) to your feet and legs, which can cause your skin to:  Become thinner and drier.  Break more easily.  Heal more slowly.  Peel and crack. You may also have nerve damage (neuropathy) in your legs and feet, causing decreased feeling in them. This means that you may not notice minor injuries to your feet that could lead to more serious problems. Noticing and addressing any potential problems early is the best way to prevent future foot problems. How to care for your feet Foot hygiene  Wash your feet daily with warm water and mild soap. Do not use hot water. Then, pat your feet and the areas between your toes until they are completely dry. Do not soak your feet as this can dry your skin.  Trim your toenails straight across. Do not dig under them or around the cuticle. File the edges of your nails with an emery board or nail file.  Apply a moisturizing lotion or petroleum jelly to the skin on your feet and to dry, brittle toenails. Use lotion that does not contain alcohol and is unscented. Do not apply lotion between your toes. Shoes and socks  Wear clean socks or stockings every day. Make sure they are not too tight. Do not wear knee-high stockings since they may decrease blood flow to your legs.  Wear shoes that fit properly and have enough cushioning. Always look in your shoes before you put them on to be sure there are no objects inside.  To break in new shoes, wear them for just a few hours a day. This prevents injuries on your feet. Wounds, scrapes, corns, and calluses  Check your feet daily for blisters, cuts, bruises, sores, and redness. If you cannot see the bottom of your feet, use a mirror or ask someone for help.  Do not cut corns or calluses or try to remove them with medicine.  If you  find a minor scrape, cut, or break in the skin on your feet, keep it and the skin around it clean and dry. You may clean these areas with mild soap and water. Do not clean the area with peroxide, alcohol, or iodine.  If you have a wound, scrape, corn, or callus on your foot, look at it several times a day to make sure it is healing and not infected. Check for: ? Redness, swelling, or pain. ? Fluid or blood. ? Warmth. ? Pus or a bad smell. General instructions  Do not cross your legs. This may decrease blood flow to your feet.  Do not use heating pads or hot water bottles on your feet. They may burn your skin. If you have lost feeling in your feet or legs, you may not know this is happening until it is too late.  Protect your feet from hot and cold by wearing shoes, such as at the beach or on hot pavement.  Schedule a complete foot exam at least once a year (annually) or more often if you have foot problems. If you have foot problems, report any cuts, sores, or bruises to your health care provider immediately. Contact a health care provider if:  You have a medical condition that increases your risk of infection and you have any cuts, sores, or bruises on your feet.  You have an injury that is not   healing.  You have redness on your legs or feet.  You feel burning or tingling in your legs or feet.  You have pain or cramps in your legs and feet.  Your legs or feet are numb.  Your feet always feel cold.  You have pain around a toenail. Get help right away if:  You have a wound, scrape, corn, or callus on your foot and: ? You have pain, swelling, or redness that gets worse. ? You have fluid or blood coming from the wound, scrape, corn, or callus. ? Your wound, scrape, corn, or callus feels warm to the touch. ? You have pus or a bad smell coming from the wound, scrape, corn, or callus. ? You have a fever. ? You have a red line going up your leg. Summary  Check your feet every day  for cuts, sores, red spots, swelling, and blisters.  Moisturize feet and legs daily.  Wear shoes that fit properly and have enough cushioning.  If you have foot problems, report any cuts, sores, or bruises to your health care provider immediately.  Schedule a complete foot exam at least once a year (annually) or more often if you have foot problems. This information is not intended to replace advice given to you by your health care provider. Make sure you discuss any questions you have with your health care provider. Document Released: 06/01/2000 Document Revised: 07/17/2017 Document Reviewed: 07/06/2016 Elsevier Interactive Patient Education  2019 Elsevier Inc.  Onychomycosis/Fungal Toenails  WHAT IS IT? An infection that lies within the keratin of your nail plate that is caused by a fungus.  WHY ME? Fungal infections affect all ages, sexes, races, and creeds.  There may be many factors that predispose you to a fungal infection such as age, coexisting medical conditions such as diabetes, or an autoimmune disease; stress, medications, fatigue, genetics, etc.  Bottom line: fungus thrives in a warm, moist environment and your shoes offer such a location.  IS IT CONTAGIOUS? Theoretically, yes.  You do not want to share shoes, nail clippers or files with someone who has fungal toenails.  Walking around barefoot in the same room or sleeping in the same bed is unlikely to transfer the organism.  It is important to realize, however, that fungus can spread easily from one nail to the next on the same foot.  HOW DO WE TREAT THIS?  There are several ways to treat this condition.  Treatment may depend on many factors such as age, medications, pregnancy, liver and kidney conditions, etc.  It is best to ask your doctor which options are available to you.  1. No treatment.   Unlike many other medical concerns, you can live with this condition.  However for many people this can be a painful condition and  may lead to ingrown toenails or a bacterial infection.  It is recommended that you keep the nails cut short to help reduce the amount of fungal nail. 2. Topical treatment.  These range from herbal remedies to prescription strength nail lacquers.  About 40-50% effective, topicals require twice daily application for approximately 9 to 12 months or until an entirely new nail has grown out.  The most effective topicals are medical grade medications available through physicians offices. 3. Oral antifungal medications.  With an 80-90% cure rate, the most common oral medication requires 3 to 4 months of therapy and stays in your system for a year as the new nail grows out.  Oral antifungal medications do require   blood work to make sure it is a safe drug for you.  A liver function panel will be performed prior to starting the medication and after the first month of treatment.  It is important to have the blood work performed to avoid any harmful side effects.  In general, this medication safe but blood work is required. 4. Laser Therapy.  This treatment is performed by applying a specialized laser to the affected nail plate.  This therapy is noninvasive, fast, and non-painful.  It is not covered by insurance and is therefore, out of pocket.  The results have been very good with a 80-95% cure rate.  The [facility] is the only practice in the area to offer this therapy. 5. Permanent Nail Avulsion.  Removing the entire nail so that a new nail will not grow back. 

## 2018-12-02 ENCOUNTER — Other Ambulatory Visit: Payer: Self-pay

## 2018-12-02 MED ORDER — GLUCOSE BLOOD VI STRP: ORAL_STRIP | 12 refills | Status: DC

## 2018-12-03 ENCOUNTER — Telehealth: Payer: Self-pay

## 2018-12-03 NOTE — Telephone Encounter (Signed)
Verbal has been given to pharmacy.

## 2018-12-03 NOTE — Telephone Encounter (Signed)
Patient called upset and crying because she was unable to get her glucose test strips from the pharmacy.  Pt said she has not checked her sugar since yesterday and needs to check it today but is all out of strips.  Patient said pharmacy needs to know the name of the test strips which is needs to say [place] before it can be refilled.  Pt said that strips need to be called in as a 29-month supply in order for insurance company to pay.

## 2018-12-11 NOTE — Progress Notes (Signed)
Subjective:  Alexis Farmer presents to clinic today with cc of  painful, thick, discolored, elongated toenails 1-5 b/l that become tender and cannot cut because of thickness. Pain is aggravated when wearing enclosed shoe gear and relieved with periodic professional debridement.  McLean-Scocuzza, Nino Glow, MD is her PCP.  Last visit Oct 29, 2018.  She states that she is seeing our Pedorthist and getting measured for her new diabetic shoes on today's visit.   Current Outpatient Medications:  .  nystatin cream (MYCOSTATIN), Apply pea size amount twice daily x 25 days, use with Triamcinolone, Disp: , Rfl:  .  triamcinolone ointment (KENALOG) 0.1 %, Apply pea size amount twice daily x 25 days, use with Nystatin, Disp: , Rfl:  .  blood glucose meter kit and supplies KIT, Accu chek, Dx code E11.65, check 3 times daily, Disp: 1 each, Rfl: 0 .  ciprofloxacin (CIPRO) 500 MG tablet, Take 1 tablet (500 mg total) by mouth 2 (two) times daily. With food, Disp: 10 tablet, Rfl: 0 .  cloNIDine (CATAPRES) 0.2 MG tablet, TAKE 1 TABLET (0.2 MG TOTAL) BY MOUTH 2 (TWO) TIMES DAILY. (Patient taking differently: Take 0.2 mg by mouth 3 (three) times daily. ), Disp: 180 tablet, Rfl: 3 .  ezetimibe (ZETIA) 10 MG tablet, Take 1 tablet (10 mg total) by mouth daily., Disp: 90 tablet, Rfl: 3 .  fluconazole (DIFLUCAN) 150 MG tablet, TAKE 1 TABLET (150 MG TOTAL) BY MOUTH ONCE FOR 1 DOSE MAY REPEAT TABLET IN 72 HOURS, Disp: , Rfl:  .  glucose blood test strip, Use as instructed, Disp: 100 each, Rfl: 12 .  insulin NPH Human (NOVOLIN N) 100 UNIT/ML injection, INJECT 25 UNITS IN THE MORNING AND 15-20 UNITS IN THE EVENING BEFORE A MEAL, Disp: 40 mL, Rfl: 0 .  insulin regular (NOVOLIN R) 100 units/mL injection, INJECT 15 UNITS TOTAL INTO THE SKIN 3 (THREE) TIMES DAILY BEFORE MEALS., Disp: 43 mL, Rfl: 11 .  Insulin Syringe-Needle U-100 30G X 1/2" 1 ML MISC, 1 Device by Does not apply route 5 (five) times daily. (NPH 2x per day and as  needed Novolin R 3x per day), Disp: 500 each, Rfl: 12 .  Multiple Vitamin (MULTI-VITAMIN) tablet, Take by mouth., Disp: , Rfl:  .  Multiple Vitamin (MULTIVITAMIN WITH MINERALS) TABS tablet, Take 1 tablet by mouth daily., Disp: , Rfl:  .  mupirocin ointment (BACTROBAN) 2 %, Apply 1 application topically 2 (two) times daily. Right index finger and left forearm, Disp: 30 g, Rfl: 0 .  olmesartan (BENICAR) 40 MG tablet, Take 1 tablet (40 mg total) by mouth daily., Disp: 90 tablet, Rfl: 3 .  rosuvastatin (CRESTOR) 10 MG tablet, TAKE 1 TABLET (10 MG TOTAL) BY MOUTH DAILY. (Patient taking differently: Take 10 mg by mouth daily. TAKE 1 TABLET (10 MG TOTAL) BY MOUTH DAILY.), Disp: 90 tablet, Rfl: 3 .  sitaGLIPtin (JANUVIA) 50 MG tablet, Take 0.5 tablets (25 mg total) by mouth daily., Disp: 90 tablet, Rfl: 3 .  spironolactone (ALDACTONE) 25 MG tablet, Take 1 tablet (25 mg total) by mouth daily., Disp: 90 tablet, Rfl: 3 .  torsemide (DEMADEX) 20 MG tablet, Take 1 tablet (20 mg total) by mouth 2 (two) times daily., Disp: 180 tablet, Rfl: 3   Allergies  Allergen Reactions  . Penicillins Rash    Has patient had a PCN reaction causing immediate rash, facial/tongue/throat swelling, SOB or lightheadedness with hypotension: Yes Has patient had a PCN reaction causing severe rash involving mucus membranes or  skin necrosis: No Has patient had a PCN reaction that required hospitalization No Has patient had a PCN reaction occurring within the last 10 years: Yes If all of the above answers are "NO", then may proceed with Cephalosporin use.      Objective: Vitals:   12/01/18 0942  Temp: 97.8 F (36.6 C)    Physical Examination:  Vascular Examination: Capillary refill time immediate x 10 digits.  Palpable DP pulses b/l.  Posterior tibial pulses diminished bilaterally.  Digital hair absent bilaterally.  No edema noted b/l.  Skin temperature gradient WNL b/l.  Dermatological Examination: Skin with  normal turgor, texture and tone b/l.  No open wounds b/l.  No interdigital macerations noted b/l.  Elongated, thick, discolored brittle toenails with subungual debris and pain on dorsal palpation of nailbeds 1-5 b/l.  Musculoskeletal Examination: Muscle strength 5/5 to all muscle groups b/l.  Pes planus foot type noted bilaterally.  Decreased ankle joint range of motion noted bilaterally.  No pain, crepitus or joint discomfort with active/passive ROM.  Neurological Examination: Sensation diminished b/l with 10 gram monofilament.  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 Toenails 1-5 b/l were debrided in length and girth without iatrogenic laceration. 2.  She is to be measured for her new diabetic shoes on today's visit with          our Pedorthist.  Continue soft, supportive shoe gear daily. 3.  Report any pedal injuries to medical professional. 4.  Follow up 10 weeks. 5.  Patient/POA to call should there be a question/concern in there interim.

## 2018-12-18 ENCOUNTER — Telehealth: Payer: Self-pay | Admitting: Internal Medicine

## 2018-12-18 ENCOUNTER — Other Ambulatory Visit: Payer: Self-pay | Admitting: Internal Medicine

## 2018-12-18 DIAGNOSIS — L0291 Cutaneous abscess, unspecified: Secondary | ICD-10-CM

## 2018-12-18 MED ORDER — MUPIROCIN 2 % EX OINT: 1.0000 "application " | TOPICAL_OINTMENT | Freq: Two times a day (BID) | CUTANEOUS | 2 refills | Status: DC

## 2018-12-18 NOTE — Telephone Encounter (Signed)
Please call the patient  1. She needs to be more specific which cream does she need a refill on and where is she using it?  What is the name?   2. What does the lump look like a boil/cyst or is there swelling around her elbow on the left or pain  What is going on with the left elbow?   [place]

## 2018-12-18 NOTE — Telephone Encounter (Signed)
If area looks more like a boil or cyst on left arm/elbow does she want to see dermatology to remove it?   If area is more on the bone does she want to see orthopedics to treat?     [place]

## 2018-12-22 ENCOUNTER — Other Ambulatory Visit: Payer: Self-pay | Admitting: Internal Medicine

## 2018-12-22 DIAGNOSIS — IMO0002 Reserved for concepts with insufficient information to code with codable children: Secondary | ICD-10-CM

## 2018-12-22 DIAGNOSIS — E1122 Type 2 diabetes mellitus with diabetic chronic kidney disease: Secondary | ICD-10-CM

## 2018-12-22 DIAGNOSIS — N183 Chronic kidney disease, stage 3 unspecified: Secondary | ICD-10-CM

## 2018-12-22 MED ORDER — INSULIN NPH (HUMAN) (ISOPHANE) 100 UNIT/ML ~~LOC~~ SUSP: SUBCUTANEOUS | 12 refills | Status: DC

## 2018-12-22 NOTE — Telephone Encounter (Signed)
Patient says it is almost well on her elbow that the Bactroban called in has helped , but she ask could refill Novolin N?

## 2019-01-01 ENCOUNTER — Ambulatory Visit: Payer: Medicare Other | Admitting: Orthotics

## 2019-01-12 ENCOUNTER — Ambulatory Visit: Payer: Medicare Other | Admitting: Orthotics

## 2019-01-19 ENCOUNTER — Ambulatory Visit: Payer: Medicare Other | Admitting: Orthotics

## 2019-01-23 ENCOUNTER — Other Ambulatory Visit: Payer: Self-pay

## 2019-01-23 ENCOUNTER — Ambulatory Visit: Payer: Medicare Other | Admitting: Orthotics

## 2019-01-23 DIAGNOSIS — M2141 Flat foot [pes planus] (acquired), right foot: Secondary | ICD-10-CM | POA: Diagnosis not present

## 2019-01-23 DIAGNOSIS — E1142 Type 2 diabetes mellitus with diabetic polyneuropathy: Secondary | ICD-10-CM

## 2019-01-23 DIAGNOSIS — E1140 Type 2 diabetes mellitus with diabetic neuropathy, unspecified: Secondary | ICD-10-CM | POA: Diagnosis not present

## 2019-01-23 DIAGNOSIS — E1159 Type 2 diabetes mellitus with other circulatory complications: Secondary | ICD-10-CM | POA: Diagnosis not present

## 2019-01-23 DIAGNOSIS — B351 Tinea unguium: Secondary | ICD-10-CM

## 2019-01-23 DIAGNOSIS — M79674 Pain in right toe(s): Secondary | ICD-10-CM

## 2019-01-23 DIAGNOSIS — M2142 Flat foot [pes planus] (acquired), left foot: Secondary | ICD-10-CM | POA: Diagnosis not present

## 2019-01-23 DIAGNOSIS — L84 Corns and callosities: Secondary | ICD-10-CM

## 2019-01-28 NOTE — Progress Notes (Signed)
Patient had appointment today for definitive and final diabetic shoe fitting and delivery.  Patient was seen by Benjie Karvonen, C.Ped, [facility].   The inserts fit well and accomplished full contact with the plantar surface of the foot bilateral; the shoes fit well and offered forefoot freedom, no noticible heel slippage, and good toe clearance w/ the insert in place.  Patient was advised to monitor of any skin irritation, breakdown.  Patient was satisfied with fit and function.   Dawn to put in charges.

## 2019-02-05 ENCOUNTER — Other Ambulatory Visit: Payer: Self-pay

## 2019-02-05 ENCOUNTER — Encounter (HOSPITAL_COMMUNITY): Payer: Self-pay

## 2019-02-05 ENCOUNTER — Ambulatory Visit (HOSPITAL_COMMUNITY)
Admission: EM | Admit: 2019-02-05 | Discharge: 2019-02-05 | Disposition: A | Discharge status: Home / Self Care | Payer: Medicare Other | Attending: Internal Medicine | Admitting: Internal Medicine

## 2019-02-05 ENCOUNTER — Telehealth (HOSPITAL_COMMUNITY): Payer: Self-pay | Admitting: Emergency Medicine

## 2019-02-05 ENCOUNTER — Telehealth: Payer: Self-pay | Admitting: *Deleted

## 2019-02-05 DIAGNOSIS — N183 Chronic kidney disease, stage 3 (moderate): Secondary | ICD-10-CM | POA: Insufficient documentation

## 2019-02-05 DIAGNOSIS — E1122 Type 2 diabetes mellitus with diabetic chronic kidney disease: Secondary | ICD-10-CM | POA: Insufficient documentation

## 2019-02-05 DIAGNOSIS — R140 Abdominal distension (gaseous): Secondary | ICD-10-CM | POA: Diagnosis not present

## 2019-02-05 DIAGNOSIS — E8770 Fluid overload, unspecified: Secondary | ICD-10-CM

## 2019-02-05 DIAGNOSIS — Z794 Long term (current) use of insulin: Secondary | ICD-10-CM | POA: Insufficient documentation

## 2019-02-05 DIAGNOSIS — I130 Hypertensive heart and chronic kidney disease with heart failure and stage 1 through stage 4 chronic kidney disease, or unspecified chronic kidney disease: Secondary | ICD-10-CM | POA: Insufficient documentation

## 2019-02-05 DIAGNOSIS — R109 Unspecified abdominal pain: Secondary | ICD-10-CM | POA: Diagnosis present

## 2019-02-05 DIAGNOSIS — I5032 Chronic diastolic (congestive) heart failure: Secondary | ICD-10-CM | POA: Diagnosis not present

## 2019-02-05 DIAGNOSIS — Z833 Family history of diabetes mellitus: Secondary | ICD-10-CM | POA: Insufficient documentation

## 2019-02-05 DIAGNOSIS — E1151 Type 2 diabetes mellitus with diabetic peripheral angiopathy without gangrene: Secondary | ICD-10-CM | POA: Insufficient documentation

## 2019-02-05 DIAGNOSIS — E1142 Type 2 diabetes mellitus with diabetic polyneuropathy: Secondary | ICD-10-CM | POA: Insufficient documentation

## 2019-02-05 DIAGNOSIS — E782 Mixed hyperlipidemia: Secondary | ICD-10-CM | POA: Insufficient documentation

## 2019-02-05 DIAGNOSIS — Z79899 Other long term (current) drug therapy: Secondary | ICD-10-CM | POA: Diagnosis not present

## 2019-02-05 DIAGNOSIS — Z880 Allergy status to penicillin: Secondary | ICD-10-CM | POA: Diagnosis not present

## 2019-02-05 DIAGNOSIS — Z8249 Family history of ischemic heart disease and other diseases of the circulatory system: Secondary | ICD-10-CM | POA: Insufficient documentation

## 2019-02-05 LAB — BASIC METABOLIC PANEL
Anion gap: 12 (ref 5–15)
BUN: 20 mg/dL (ref 8–23)
CO2: 26 mmol/L (ref 22–32)
Calcium: 9.5 mg/dL (ref 8.9–10.3)
Chloride: 94 mmol/L — ABNORMAL LOW (ref 98–111)
Creatinine, Ser: 1.91 mg/dL — ABNORMAL HIGH (ref 0.44–1.00)
GFR calc Af Amer: 28 mL/min — ABNORMAL LOW (ref >60)
GFR calc non Af Amer: 24 mL/min — ABNORMAL LOW (ref >60)
Glucose, Bld: 279 mg/dL — ABNORMAL HIGH (ref 70–99)
Potassium: 4.8 mmol/L (ref 3.5–5.1)
Sodium: 132 mmol/L — ABNORMAL LOW (ref 135–145)

## 2019-02-05 LAB — CBC
HCT: 40.9 % (ref 36.0–46.0)
Hemoglobin: 13.4 g/dL (ref 12.0–15.0)
MCH: 29.5 pg (ref 26.0–34.0)
MCHC: 32.8 g/dL (ref 30.0–36.0)
MCV: 89.9 fL (ref 80.0–100.0)
Platelets: 249 10*3/uL (ref 150–400)
RBC: 4.55 MIL/uL (ref 3.87–5.11)
RDW: 13.2 % (ref 11.5–15.5)
WBC: 7.2 10*3/uL (ref 4.0–10.5)
nRBC: 0 % (ref 0.0–0.2)

## 2019-02-05 LAB — BRAIN NATRIURETIC PEPTIDE: B Natriuretic Peptide: 16.3 pg/mL (ref 0.0–100.0)

## 2019-02-05 MED ORDER — TORSEMIDE 20 MG PO TABS: 20.0000 mg | ORAL_TABLET | Freq: Every day | ORAL | 0 refills | Status: DC

## 2019-02-05 NOTE — Telephone Encounter (Signed)
Copied from [place] [id number]. Topic: General - Other >> Feb 05, 2019  7:48 AM Keene Breath wrote: Reason for CRM: Patient called to schedule an appt. For today.  States that her top stomach is swollen since yesterday.  No other symptoms.  CB# [phone]

## 2019-02-05 NOTE — ED Triage Notes (Signed)
Patient presents to Urgent Care with complaints of abdominal pain and distention since 3 days ago. Patient reports she sometimes feels a "quiver" in her abdomen like there is "a baby in there". Last BM yesterday, denies constipation, nausea, or emesis.

## 2019-02-05 NOTE — Telephone Encounter (Signed)
Left message to return call to  Office phone went straight to voicemail.

## 2019-02-05 NOTE — Telephone Encounter (Signed)
Per Dr. Lanny Cramp, call pt and hold off on taking torsemide until after she sees her PCP tomorrow. Contacted patient, agreeable to plan, will follow up with PCP tomorrow.

## 2019-02-05 NOTE — ED Provider Notes (Signed)
[facility]    CSN: [id number] Arrival date & time: 02/05/19  [id number]      History   Chief Complaint Chief Complaint  Patient presents with  . Abdominal Pain    HPI Alexis Farmer is a 78 y.o. female with a history of diabetes mellitus type 2 on oral hypoglycemic agents, hypertension on antihypertensive medications comes to urgent care with complaints of feeling of distended abdomen.  Patient says that previously when she sat down she was not able to visualize the upper part of her abdomen but she feels tired over the past few days also she is able to visualize the upper part of her abdomen and gives her the feeling that the abdomen is distended.  She denies any abdominal pain.  She denies any early satiety.  No nausea or vomiting.  She is diabetic and has been trying to lose weight.  Her appetite remains fine.  No diarrhea.  No change in bowel habits.  No dysuria, urgency or frequency.  No vaginal discharge.  Patient has some lower extremity edema which was apparently at baseline.  Patient has previously been on torsemide for diastolic heart failure.  I was discontinued a few months ago.  HPI  Past Medical History:  Diagnosis Date  . Arthritis   . Chickenpox   . Diabetes mellitus without complication ([place])    one elevated reading/ no treatment  . Diverticulitis   . GI bleed   . High cholesterol   . History of blood transfusion   . Hypertension   . Renal insufficiency     Patient Active Problem List   Diagnosis Date Noted  . 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 Disorder of right rotator cuff 06/24/2018  . Abnormal gait 03/26/2018  . Fall 03/26/2018  . Arthritis 03/26/2018  . Thyroid nodule 01/20/2018  . Dysuria 03/22/2017  . Cervical spondylosis with radiculopathy 01/26/2017  . Rectal bleed 07/29/2016  . Carotid stenosis 07/19/2016  . PAD (peripheral artery disease) ([place]) 07/19/2016  . Pain and swelling of lower leg 07/19/2016   . Bradycardia 07/17/2016  . Postmenopausal bleeding 07/17/2016  . Chronic diastolic CHF (congestive heart failure) ([place]) 05/31/2016  . Cough 01/25/2016  . Chronic fatigue 11/15/2015  . Depression 10/13/2015  . Osteoarthritis 10/13/2015  . Vertigo 10/13/2015  . Anxiety 09/13/2015  . GI bleed 08/03/2015  . Type 2 diabetes mellitus with stage 3 chronic kidney disease, with long-term current use of insulin ([place]) 03/11/2015  . Mixed hyperlipidemia 08/26/2014  . Essential hypertension 04/08/2014  . Diabetes mellitus type II, uncontrolled ([place]) 04/08/2014  . CKD (chronic kidney disease) stage 3, GFR 30-59 ml/min ([facility]) 04/08/2014  . Pedal edema 04/08/2014  . Morbid obesity ([place]) 04/08/2014  . Lower back pain 04/08/2014  . Diabetic polyneuropathy ([place]) 04/08/2014    Past Surgical History:  Procedure Laterality Date  . APPENDECTOMY    . CHOLECYSTECTOMY    . ECTOPIC PREGNANCY SURGERY    . EYE SURGERY     bilateral cataracts  . gallbladder     . HYSTEROSCOPY W/D&C N/A 10/26/2016   Procedure: DILATATION AND CURETTAGE /HYSTEROSCOPY;  Surgeon: Benjaman Kindler, MD;  Location: [facility] ORS;  Service: Gynecology;  Laterality: N/A;  . HYSTEROSCOPY W/D&C N/A 07/07/2018   Procedure: DILATATION AND CURETTAGE /HYSTEROSCOPY;  Surgeon: Benjaman Kindler, MD;  Location: [facility] ORS;  Service: Gynecology;  Laterality: N/A;  . THYROIDECTOMY, PARTIAL      OB History   No obstetric history on file.  Home Medications    Prior to Admission medications   Medication Sig Start Date End Date Taking? Authorizing Provider  blood glucose meter kit and supplies KIT Accu chek, Dx code E11.65, check 3 times daily 07/26/17   Leone Haven, MD  cloNIDine (CATAPRES) 0.2 MG tablet TAKE 1 TABLET (0.2 MG TOTAL) BY MOUTH 2 (TWO) TIMES DAILY. Patient taking differently: Take 0.2 mg by mouth 3 (three) times daily.  02/03/16   Minna Merritts, MD  ezetimibe (ZETIA) 10 MG tablet Take 1 tablet (10 mg total) by mouth  daily. 10/29/18   McLean-Scocuzza, Nino Glow, MD  glucose blood test strip Use as instructed 12/02/18   McLean-Scocuzza, Nino Glow, MD  insulin NPH Human (NOVOLIN N) 100 UNIT/ML injection INJECT 20 UNITS IN THE MORNING AND 20 UNITS IN THE EVENING WITH A MEAL 12/22/18   McLean-Scocuzza, Nino Glow, MD  insulin regular (NOVOLIN R) 100 units/mL injection INJECT 15 UNITS TOTAL INTO THE SKIN 3 (THREE) TIMES DAILY BEFORE MEALS. 09/03/18   McLean-Scocuzza, Nino Glow, MD  Insulin Syringe-Needle U-100 30G X 1/2" 1 ML MISC 1 Device by Does not apply route 5 (five) times daily. (NPH 2x per day and as needed Novolin R 3x per day) 07/16/18   McLean-Scocuzza, Nino Glow, MD  Multiple Vitamin (MULTI-VITAMIN) tablet Take by mouth.    [provider]  Multiple Vitamin (MULTIVITAMIN WITH MINERALS) TABS tablet Take 1 tablet by mouth daily.    [provider]  mupirocin ointment (BACTROBAN) 2 % Apply 1 application topically 2 (two) times daily. Right index finger and left forearm 12/18/18   McLean-Scocuzza, Nino Glow, MD  nystatin cream (MYCOSTATIN) Apply pea size amount twice daily x 25 days, use with Triamcinolone 11/18/18   [provider]  olmesartan (BENICAR) 40 MG tablet Take 1 tablet (40 mg total) by mouth daily. 11/13/18   McLean-Scocuzza, Nino Glow, MD  rosuvastatin (CRESTOR) 10 MG tablet TAKE 1 TABLET (10 MG TOTAL) BY MOUTH DAILY. Patient taking differently: Take 10 mg by mouth daily. TAKE 1 TABLET (10 MG TOTAL) BY MOUTH DAILY. 11/25/17   Leone Haven, MD  sitaGLIPtin (JANUVIA) 50 MG tablet Take 0.5 tablets (25 mg total) by mouth daily. 11/24/18   McLean-Scocuzza, Nino Glow, MD  spironolactone (ALDACTONE) 25 MG tablet Take 1 tablet (25 mg total) by mouth daily. 11/13/18   McLean-Scocuzza, Nino Glow, MD  torsemide (DEMADEX) 20 MG tablet Take 1 tablet (20 mg total) by mouth daily. 02/05/19 03/07/19  Chase Picket, MD  triamcinolone ointment (KENALOG) 0.1 % Apply pea size amount twice daily x 25 days, use with  Nystatin 11/18/18   [provider]    Family History Family History  Problem Relation Age of Onset  . Diabetes Mother   . Hypertension Mother   . Stroke Mother   . Diabetes Other   . Healthy Father   . Diabetes Sister   . Heart disease Sister     Social History Social History   Tobacco Use  . Smoking status: Never Smoker  . Smokeless tobacco: Never Used  Substance Use Topics  . Alcohol use: No  . Drug use: No     Allergies   Penicillins   Review of Systems Review of Systems  Constitutional: Negative.   Respiratory: Negative for cough, shortness of breath and wheezing.   Cardiovascular: Negative.   Gastrointestinal: Positive for abdominal distention. Negative for abdominal pain, diarrhea, nausea and vomiting.  Genitourinary: Negative for dysuria, frequency and urgency.  Musculoskeletal:  Negative.   Skin: Negative.   Neurological: Negative.   Psychiatric/Behavioral: Negative for agitation and confusion.     Physical Exam Triage Vital Signs ED Triage Vitals [02/05/19 1011]  Enc Vitals Group     BP      Pulse      Resp      Temp      Temp src      SpO2      Weight      Height      Head Circumference      [place] Flow      Pain Score 0     Pain Loc      Pain Edu?      Excl. in [place]?    No data found.  Updated Vital Signs BP (!) 180/87 (BP Location: Left Arm) Comment: has not taken BP meds yet today  Pulse 96   Temp 98.6 F (37 C) (Oral)   Resp 17   SpO2 98%   Visual Acuity Right Eye Distance:   Left Eye Distance:   Bilateral Distance:    Right Eye Near:   Left Eye Near:    Bilateral Near:     Physical Exam Constitutional:      Appearance: She is obese. She is not ill-appearing, toxic-appearing or diaphoretic.  Cardiovascular:     Rate and Rhythm: Normal rate.     Heart sounds: No murmur. No friction rub.  Pulmonary:     Effort: Pulmonary effort is normal. No respiratory distress.     Breath sounds: Normal breath sounds. No  wheezing or rhonchi.  Abdominal:     General: Abdomen is protuberant. Bowel sounds are normal. There is no distension or abdominal bruit. There are no signs of injury.     Palpations: Abdomen is soft. There is no shifting dullness, hepatomegaly, mass or pulsatile mass.     Tenderness: There is no abdominal tenderness. There is no right CVA tenderness or guarding. Negative signs include Murphy's sign and McBurney's sign.     Hernia: No hernia is present.  Skin:    General: Skin is warm.     Capillary Refill: Capillary refill takes less than 2 seconds.     Coloration: Skin is not mottled.     Findings: No erythema.     Comments: 1+ pedal edema  Neurological:     Mental Status: She is alert.      UC Treatments / Results  Labs (all labs ordered are listed, but only abnormal results are displayed) Labs Reviewed  BASIC METABOLIC PANEL - Abnormal; Notable for the following components:      Result Value   Sodium 132 (*)    Chloride 94 (*)    Glucose, Bld 279 (*)    Creatinine, Ser 1.91 (*)    GFR calc non Af Amer 24 (*)    GFR calc Af Amer 28 (*)    All other components within normal limits  BRAIN NATRIURETIC PEPTIDE  CBC    EKG   Radiology No results found.  Procedures Procedures (including critical care time)  Medications Ordered in UC Medications - No data to display  Initial Impression / Assessment and Plan / UC Course  I have reviewed the triage vital signs and the nursing notes.  Pertinent labs & imaging results that were available during my care of the patient were reviewed by me and considered in my medical decision making (see chart for details).     1.  Protuberant  abdomen: Patient looks mildly hypervolemic BMP, BNP Patient was restarted on torsemide Patient has an appointment with primary care physician tomorrow Patient is advised to follow-up with PCP.  If patient's symptoms worsen she is advised to return to urgent care to be evaluated Final Clinical  Impressions(s) / UC Diagnoses   Final diagnoses:  Hypervolemia, unspecified hypervolemia type   Discharge Instructions   None    ED Prescriptions    Medication Sig Dispense Auth. Provider   torsemide (DEMADEX) 20 MG tablet Take 1 tablet (20 mg total) by mouth daily. 30 tablet Kairi Harshbarger, Myrene Galas, MD     Controlled Substance Prescriptions [place] Controlled Substance Registry consulted? No   Chase Picket, MD 02/05/19 [phone]

## 2019-02-05 NOTE — Telephone Encounter (Signed)
Patient said that she went to [facility] walk-in clinic in [place] today for a swollen abdomen.  Patient said she was told her kidney function was low.    Patient said that she was told to f/u with her PCP.  Patient has already scheduled an in-person appt tomorrow (02/06/19)  w/ PCP @ 2:00 pm.  No COVID symptoms.

## 2019-02-06 ENCOUNTER — Other Ambulatory Visit: Payer: Self-pay

## 2019-02-06 ENCOUNTER — Encounter: Payer: Self-pay | Admitting: Internal Medicine

## 2019-02-06 ENCOUNTER — Ambulatory Visit (INDEPENDENT_AMBULATORY_CARE_PROVIDER_SITE_OTHER): Payer: Medicare Other | Admitting: Internal Medicine

## 2019-02-06 ENCOUNTER — Ambulatory Visit
Admission: RE | Admit: 2019-02-06 | Discharge: 2019-02-06 | Disposition: A | Discharge status: Home / Self Care | Payer: Medicare Other | Source: Ambulatory Visit | Attending: Internal Medicine | Admitting: Internal Medicine

## 2019-02-06 DIAGNOSIS — E1165 Type 2 diabetes mellitus with hyperglycemia: Secondary | ICD-10-CM | POA: Diagnosis not present

## 2019-02-06 DIAGNOSIS — R140 Abdominal distension (gaseous): Secondary | ICD-10-CM | POA: Diagnosis present

## 2019-02-06 DIAGNOSIS — R319 Hematuria, unspecified: Secondary | ICD-10-CM | POA: Insufficient documentation

## 2019-02-06 DIAGNOSIS — R300 Dysuria: Secondary | ICD-10-CM | POA: Diagnosis not present

## 2019-02-06 DIAGNOSIS — N179 Acute kidney failure, unspecified: Secondary | ICD-10-CM | POA: Insufficient documentation

## 2019-02-06 DIAGNOSIS — E041 Nontoxic single thyroid nodule: Secondary | ICD-10-CM | POA: Diagnosis not present

## 2019-02-06 DIAGNOSIS — R1084 Generalized abdominal pain: Secondary | ICD-10-CM

## 2019-02-06 DIAGNOSIS — H33329 Round hole, unspecified eye: Secondary | ICD-10-CM | POA: Insufficient documentation

## 2019-02-06 DIAGNOSIS — Z794 Long term (current) use of insulin: Secondary | ICD-10-CM

## 2019-02-06 DIAGNOSIS — I5032 Chronic diastolic (congestive) heart failure: Secondary | ICD-10-CM

## 2019-02-06 DIAGNOSIS — N183 Type 2 diabetes mellitus with diabetic chronic kidney disease: Secondary | ICD-10-CM

## 2019-02-06 DIAGNOSIS — I10 Essential (primary) hypertension: Secondary | ICD-10-CM

## 2019-02-06 DIAGNOSIS — E1122 Type 2 diabetes mellitus with diabetic chronic kidney disease: Secondary | ICD-10-CM

## 2019-02-06 DIAGNOSIS — H33321 Round hole, right eye: Secondary | ICD-10-CM

## 2019-02-06 DIAGNOSIS — N1832 Chronic kidney disease, stage 3b: Secondary | ICD-10-CM | POA: Insufficient documentation

## 2019-02-06 MED ORDER — AMLODIPINE BESYLATE 2.5 MG PO TABS: 2.5000 mg | ORAL_TABLET | Freq: Every day | ORAL | 3 refills | Status: DC

## 2019-02-06 NOTE — Progress Notes (Addendum)
Chief Complaint  Patient presents with  . Follow-up   F/u  1. 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 She is drinking 24 oz x 3 total daily intake of water 72 Oz daily. Prev ANA/SPEP/UPEP negative 11/19/2018    07/02/2018 12:12 Sodium: 136 Potassium: 4.7 Chloride: 98 CO2: 29 Glucose: 146 (H) BUN: 25 (H) Creatinine: 1.37 (H) Calcium: 9.9 Anion gap: 9 GFR, Est Non African American: 37 (L) GFR, Est African American: 42 (L)  07/07/2018 09:28 Sodium: 135 Potassium: 4.8 Chloride: 99 CO2: 27 Glucose: 273 (H) BUN: 23 Creatinine: 1.25 (H) Calcium: 9.4 Anion gap: 9 GFR, Est Non African American: 41 (L) GFR, Est African American: 47 (L)  10/28/2018 09:51 Sodium: 133 (L) Potassium: 5.0 Chloride: 95 (L) CO2: 30 Glucose: 220 (H) BUN: 31 (H) Creatinine: 1.93 (H) Calcium: 11.1 (H) Alkaline Phosphatase: 50 Albumin: 4.4 AST: 22 ALT: 19 Total Protein: 7.2 Total Bilirubin: 0.6 GFR: 30.24 (L)  11/19/2018 09:53 Sodium: 136 Potassium: 4.4 Chloride: 101 CO2: 27 Glucose: 112 (H) BUN: 17 Creatinine: 1.14 Calcium: 10.1 Total Protein: 7.2 GFR: 55.51 (L)  02/05/2019 10:49 Sodium: 132 (L) Potassium: 4.8 Chloride: 94 (L) CO2: 26 Glucose: 279 (H) BUN: 20 Creatinine: 1.91 (H) Calcium: 9.5 Anion gap: 12 GFR, Est Non African American: 24 (L) GFR, Est African American: 28 (L)   2. H/o chronic diastolic CHF weight up 4 lbs since 06/2018 and she reports her abdomen on 02/05/19 was distended, denies CP or SOB bnp was normal 163  Echo 2019 grade 1 dd with EF 62% -02/05/19 UC added back Torsemide 20 mg which she had previously stopped taking but she has not taken a dose   3. Right retinal surgery scheduled 02/11/19 for hole in retina 4. DM 2 -unclear which medications she is taking as she reports not taking novolin R  and novolin N doing 23-25 units bid and stopped taking [place] she was supposed to be on 25 mg qd  5. HTN BP slightly elevated today meds clonidine 0.2 tid, spirolactone 25 mg qd, benicar 40 mg qd unclear if she is taking these medications as instructed   Review of Systems  Constitutional: Negative for weight loss.  HENT: Negative for hearing loss.   Eyes: Negative for blurred vision.  Respiratory: Negative for shortness of breath.   Cardiovascular: Positive for leg swelling. Negative for chest pain.       +chronic leg edema L>R per pt improved today more than normal   Gastrointestinal: Negative for abdominal pain.       Ab swelling reduced in mid abdomen    Musculoskeletal: Negative for falls.  Skin: Negative for rash.  Neurological: Negative for headaches.  Psychiatric/Behavioral: Negative for depression.   Past Medical History:  Diagnosis Date  . Arthritis   . Chickenpox   . Diabetes mellitus without complication ([place])    one elevated reading/ no treatment  . Diverticulitis   . GI bleed   . High cholesterol   . History of blood transfusion   . Hypertension   . Renal insufficiency    Past Surgical History:  Procedure Laterality Date  . APPENDECTOMY    . CHOLECYSTECTOMY    . ECTOPIC PREGNANCY SURGERY    . EYE SURGERY     bilateral cataracts  . gallbladder     .  HYSTEROSCOPY W/D&C N/A 10/26/2016   Procedure: DILATATION AND CURETTAGE /HYSTEROSCOPY;  Surgeon: Benjaman Kindler, MD;  Location: [facility] ORS;  Service: Gynecology;  Laterality: N/A;  . HYSTEROSCOPY W/D&C N/A 07/07/2018   Procedure: DILATATION AND CURETTAGE /HYSTEROSCOPY;  Surgeon: Benjaman Kindler, MD;  Location: [facility] ORS;  Service: Gynecology;  Laterality: N/A;  . THYROIDECTOMY, PARTIAL     Family History  Problem Relation Age of Onset  . Diabetes Mother   . Hypertension Mother   . Stroke Mother   . Diabetes Other   . Healthy Father   . Diabetes Sister   . Heart disease Sister    Social History    Socioeconomic History  . Marital status: Widowed    Spouse name: Not on file  . Number of children: Not on file  . Years of education: Not on file  . Highest education level: Not on file  Occupational History  . Not on file  Social Needs  . Financial resource strain: Not on file  . Food insecurity    Worry: Not on file    Inability: Not on file  . Transportation needs    Medical: Not on file    Non-medical: Not on file  Tobacco Use  . Smoking status: Never Smoker  . Smokeless tobacco: Never Used  Substance and Sexual Activity  . Alcohol use: No  . Drug use: No  . Sexual activity: Not on file  Lifestyle  . Physical activity    Days per week: Not on file    Minutes per session: Not on file  . Stress: Not on file  Relationships  . Social Herbalist on phone: Not on file    Gets together: Not on file    Attends religious service: Not on file    Active member of club or organization: Not on file    Attends meetings of clubs or organizations: Not on file    Relationship status: Not on file  . Intimate partner violence    Fear of current or ex partner: Not on file    Emotionally abused: Not on file    Physically abused: Not on file    Forced sexual activity: Not on file  Other Topics Concern  . Not on file  Social History Narrative   Lives alone    From [place]   Widowed    Current Meds  Medication Sig  . blood glucose meter kit and supplies KIT Accu chek, Dx code E11.65, check 3 times daily  . cloNIDine (CATAPRES) 0.2 MG tablet TAKE 1 TABLET (0.2 MG TOTAL) BY MOUTH 2 (TWO) TIMES DAILY. (Patient taking differently: Take 0.2 mg by mouth 3 (three) times daily. )  . ezetimibe (ZETIA) 10 MG tablet Take 1 tablet (10 mg total) by mouth daily.  Marland Kitchen glucose blood test strip Use as instructed  . insulin NPH Human (NOVOLIN N) 100 UNIT/ML injection INJECT 20 UNITS IN THE MORNING AND 20 UNITS IN THE EVENING WITH A MEAL  . insulin regular (NOVOLIN R) 100 units/mL injection  INJECT 15 UNITS TOTAL INTO THE SKIN 3 (THREE) TIMES DAILY BEFORE MEALS.  Marland Kitchen Insulin Syringe-Needle U-100 30G X 1/2" 1 ML MISC 1 Device by Does not apply route 5 (five) times daily. (NPH 2x per day and as needed Novolin R 3x per day)  . Multiple Vitamin (MULTI-VITAMIN) tablet Take by mouth.  . Multiple Vitamin (MULTIVITAMIN WITH MINERALS) TABS tablet Take 1 tablet by mouth daily.  . mupirocin ointment (BACTROBAN) 2 %  Apply 1 application topically 2 (two) times daily. Right index finger and left forearm  . nystatin cream (MYCOSTATIN) Apply pea size amount twice daily x 25 days, use with Triamcinolone  . olmesartan (BENICAR) 40 MG tablet Take 1 tablet (40 mg total) by mouth daily.  . rosuvastatin (CRESTOR) 10 MG tablet TAKE 1 TABLET (10 MG TOTAL) BY MOUTH DAILY. (Patient taking differently: Take 10 mg by mouth daily. TAKE 1 TABLET (10 MG TOTAL) BY MOUTH DAILY.)  . sitaGLIPtin (JANUVIA) 50 MG tablet Take 0.5 tablets (25 mg total) by mouth daily.  Marland Kitchen spironolactone (ALDACTONE) 25 MG tablet Take 1 tablet (25 mg total) by mouth daily.  Marland Kitchen triamcinolone ointment (KENALOG) 0.1 % Apply pea size amount twice daily x 25 days, use with Nystatin  . [DISCONTINUED] torsemide (DEMADEX) 20 MG tablet Take 1 tablet (20 mg total) by mouth daily.   Allergies  Allergen Reactions  . Penicillins Rash    Has patient had a PCN reaction causing immediate rash, facial/tongue/throat swelling, SOB or lightheadedness with hypotension: Yes Has patient had a PCN reaction causing severe rash involving mucus membranes or skin necrosis: No Has patient had a PCN reaction that required hospitalization No Has patient had a PCN reaction occurring within the last 10 years: Yes If all of the above answers are "NO", then may proceed with Cephalosporin use.    Recent Results (from the past 2160 hour(s))  Urine Culture     Status: None   Collection Time: 11/19/18  9:53 AM   Specimen: Urine  Result Value Ref Range   MICRO NUMBER:  [id number]    SPECIMEN QUALITY: Adequate    Sample Source URINE    STATUS: FINAL    Result: No Growth   Microalbumin / creatinine urine ratio     Status: None   Collection Time: 11/19/18  9:53 AM  Result Value Ref Range   Creatinine, Urine 98.2 Not Estab. mg/dL   Microalbumin, Urine 15.6 Not Estab. ug/mL   Microalb/Creat Ratio 16 0 - 29 mg/g creat    Comment:                        Normal:                0 -  29                        Moderately increased: 30 - 300                        Severely increased:       >300               **Please note reference interval change**   Sodium, urine, random     Status: None   Collection Time: 11/19/18  9:53 AM  Result Value Ref Range   Sodium, Ur 25 Not Estab. mmol/L  Basic Metabolic Panel (BMET)     Status: Abnormal   Collection Time: 11/19/18  9:53 AM  Result Value Ref Range   Sodium 136 135 - 145 mEq/L   Potassium 4.4 3.5 - 5.1 mEq/L   Chloride 101 96 - 112 mEq/L   CO2 27 19 - 32 mEq/L   Glucose, Bld 112 (H) 70 - 99 mg/dL   BUN 17 6 - 23 mg/dL   Creatinine, Ser 1.14 0.40 - 1.20 mg/dL   Calcium 10.1 8.4 - 10.5 mg/dL  GFR 55.51 (L) >60.00 mL/min  Protein Electrophoresis, Urine Rflx.     Status: None   Collection Time: 11/19/18  9:53 AM  Result Value Ref Range   Protein, Ur 12.2 Not Estab. mg/dL   Albumin ELP, Urine 38.7 %   Alpha-1-Globulin, U 3.3 %   Alpha-2-Globulin, U 13.3 %   Beta Globulin, U 24.8 %   Gamma Globulin, U 19.9 %   M Component, Ur Not Observed Not Observed %   Please Note: Comment     Comment: Protein electrophoresis scan will follow via computer, mail, or courier delivery.   Protein Electrophoresis, (serum)     Status: None   Collection Time: 11/19/18  9:53 AM  Result Value Ref Range   Total Protein 7.2 6.0 - 8.5 g/dL   Albumin ELP 4.0 2.9 - 4.4 g/dL   Alpha 1 0.2 0.0 - 0.4 g/dL   Alpha 2 0.8 0.4 - 1.0 g/dL   Beta 1.1 0.7 - 1.3 g/dL   Gamma Globulin 1.1 0.4 - 1.8 g/dL   M-Spike, % Not Observed Not  Observed g/dL   Globulin, Total 3.2 2.2 - 3.9 g/dL   A/G Ratio 1.3 0.7 - 1.7   Please Note: Comment     Comment: Protein electrophoresis scan will follow via computer, mail, or courier delivery.    Interpretation: Comment     Comment: The SPE pattern appears unremarkable. Evidence of monoclonal protein is not apparent.   Antinuclear Antib (ANA)     Status: None   Collection Time: 11/19/18  9:53 AM  Result Value Ref Range   Anti Nuclear Antibody (ANA) Negative Negative  Basic metabolic panel     Status: Abnormal   Collection Time: 02/05/19 10:49 AM  Result Value Ref Range   Sodium 132 (L) 135 - 145 mmol/L   Potassium 4.8 3.5 - 5.1 mmol/L   Chloride 94 (L) 98 - 111 mmol/L   CO2 26 22 - 32 mmol/L   Glucose, Bld 279 (H) 70 - 99 mg/dL   BUN 20 8 - 23 mg/dL   Creatinine, Ser 1.91 (H) 0.44 - 1.00 mg/dL   Calcium 9.5 8.9 - 10.3 mg/dL   GFR calc non Af Amer 24 (L) >60 mL/min   GFR calc Af Amer 28 (L) >60 mL/min   Anion gap 12 5 - 15    Comment: Performed at [facility] [address] [place], [place] [address]  Brain natriuretic peptide     Status: None   Collection Time: 02/05/19 10:49 AM  Result Value Ref Range   B Natriuretic Peptide 16.3 0.0 - 100.0 pg/mL    Comment: Performed at [facility] [address], [place], [place] [address]  CBC     Status: None   Collection Time: 02/05/19 11:01 AM  Result Value Ref Range   WBC 7.2 4.0 - 10.5 K/uL   RBC 4.55 3.87 - 5.11 MIL/uL   Hemoglobin 13.4 12.0 - 15.0 g/dL   HCT 40.9 36.0 - 46.0 %   MCV 89.9 80.0 - 100.0 fL   MCH 29.5 26.0 - 34.0 pg   MCHC 32.8 30.0 - 36.0 g/dL   RDW 13.2 11.5 - 15.5 %   Platelets 249 150 - 400 K/uL   nRBC 0.0 0.0 - 0.2 %    Comment: Performed at [facility], [place] [address] [place], [place] [address]   Objective  There is no height or weight on file to calculate BMI. Wt  Readings from Last 3 Encounters:  08/21/18 261 lb (118.4 kg)  08/13/18 259 lb 12.8 oz (117.8 kg)  07/07/18  257 lb 15 oz (117 kg)   Temp Readings from Last 3 Encounters:  02/05/19 98.6 F (37 C) (Oral)  12/01/18 97.8 F (36.6 C)  07/07/18 98.3 F (36.8 C) (Oral)   BP Readings from Last 3 Encounters:  02/05/19 (!) 180/87  08/21/18 122/76  08/13/18 136/75   Pulse Readings from Last 3 Encounters:  02/05/19 96  08/21/18 65  08/13/18 62    Physical Exam Vitals signs and nursing note reviewed.  Constitutional:      Appearance: Normal appearance. She is well-developed and well-groomed. She is obese.  HENT:     Head: Normocephalic and atraumatic.     Comments: +mask on   Eyes:     Conjunctiva/sclera: Conjunctivae normal.     Pupils: Pupils are equal, round, and reactive to light.  Cardiovascular:     Rate and Rhythm: Normal rate and regular rhythm.     Heart sounds: Normal heart sounds.     Comments: L>R No calf pain  Pulmonary:     Effort: Pulmonary effort is normal.     Breath sounds: Normal breath sounds.  Musculoskeletal:     Right lower leg: 1+ Pitting Edema present.     Left lower leg: 1+ Pitting Edema present.  Skin:    General: Skin is warm and dry.  Neurological:     General: No focal deficit present.     Mental Status: She is alert and oriented to person, place, and time. Mental status is at baseline.     Gait: Gait normal.  Psychiatric:        Attention and Perception: Attention and perception normal.        Mood and Affect: Mood and affect normal.        Speech: Speech normal.        Behavior: Behavior normal. Behavior is cooperative.        Thought Content: Thought content normal.        Cognition and Memory: Cognition and memory normal.        Judgment: Judgment normal.     Assessment   1. AKI on CKD. Prev ANA, SPEP/UPEP negative   Saw renal 03/17/18 CKD 3 2/2 diabetes BP elevated 139/68,142/84 considering ACEI/ARB f/u in 6 months Dr. Candiss Norse  2. Chronic diastolic CHF 3. 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 DM 2 with  nephropathy, hyperglycemia uncontrolled A1C 10.6 10/28/18  5. HTN 6. Abdominal bloating possibly due to #2 and increased water intake r/o other  7. HM  Plan  1. Refer stat to Dr. Candiss Norse pt having eye surgery 02/11/19 will not be able to drive x 1 month and trouble with transportation  fena and cr today  CT ab w/o etiology from kidneys on imaging  R/o infection with culture  2. rec reduce fluids from 72 oz to no more the 50 oz qd  Monitor  Hold torsemide 20 currently due to AKI on CKD  Saw renal 02/18/29 BP 142/73 CKD 3 likely 2/2 DM and HTN others FSGS due to obesity bL creatinine 1.14 GFR 55 11/2018 off torsemide and volume status controlled checked renal panel  rec off ACEI/ARB due to fluctuating renal function  Cont norvasc 2.5 mg qd and spironolactone 25 mg qd  F/u in 6 months and rec good BP control  3. Surgery in [place] 02/11/19 4.  Novolin N 20 units 2x per day  Novolin R per SSI-S scale given to pt today   ON januvia 25 mg (daily) 1/2 dose of 50 mg  Always eat with insulin   A1C today   Novolin R based on sliding scale  Need for control of DM to help with worsening kidney function  5.  Hold benicar 40 mg for now start norvasc 2.5 mg qd  Continue clonidine 0.2 tid and spironolactone 25 mg qd  Hold torsemide 20 mg qd for now 6. CT ab/pelvis today r/o GI/U etiology  Reduce water intake total fluids no more than 50 oz qd  7.  Flu shot will need for this year  Check on other vaccines (I.e Tdap, prevnar, pna 23, shingrix)   mammo had 08/17/17 normal [facility] per pt has upcoming sch  Pap had 05/28/18 negative [facility]  DEXA pt declines for now   Colonoscopy Dr. Allen Norris in [facility] -09/10/14 diverticulosis sessile polyp  Consider check hep Cthough not in age window  CT chest never smoker   02/03/18 thyroid bx benign bx left middle and lower thyroid -repeat tfts today 02/06/19     Provider: Dr. Olivia Mackie McLean-Scocuzza-Internal Medicine

## 2019-02-06 NOTE — Patient Instructions (Addendum)
Reduce your fluid intake to no more the 50 ounces in 1 day   For diabetes you should be on  Novolin N 20 units 2x per day  ON januvia 25 mg (daily) 1/2 dose of 50 mg  Always eat with insulin   Novolin R based on sliding scale  Do not take Torsemide 20 for now with your elevated kidney #s  We will refer you to Dr. Candiss Norse kidney doctor, scan of your abdomen and pelvis and kidneys  Also stop benicar 40 mg for now and start norvasc 2.5 mg daily for blood pressure instead      Acute Kidney Injury, Adult  Acute kidney injury is a sudden worsening of kidney function. The kidneys are organs that have several jobs. They filter the blood to remove waste products and extra fluid. They also maintain a healthy balance of minerals and hormones in the body, which helps control blood pressure and keep bones strong. With this condition, your kidneys do not do their jobs as well as they should. This condition ranges from mild to severe. Over time it may develop into long-lasting (chronic) kidney disease. Early detection and treatment may prevent acute kidney injury from developing into a chronic condition. What are the causes? Common causes of this condition include:  A problem with blood flow to the kidneys. This may be caused by: ? Low blood pressure (hypotension) or shock. ? Blood loss. ? Heart and blood vessel (cardiovascular) disease. ? Severe burns. ? Liver disease.  Direct damage to the kidneys. This may be caused by: ? Certain medicines. ? A kidney infection. ? Poisoning. ? Being around or in contact with toxic substances. ? A surgical wound. ? A hard, direct hit to the kidney area.  A sudden blockage of urine flow. This may be caused by: ? Cancer. ? Kidney stones. ? An enlarged prostate in males. What are the signs or symptoms? Symptoms of this condition may not be obvious until the condition becomes severe. Symptoms of this condition can include:  Tiredness (lethargy), or  difficulty staying awake.  Nausea or vomiting.  Swelling (edema) of the face, legs, ankles, or feet.  Problems with urination, such as: ? Abdominal pain, or pain along the side of your stomach (flank). ? Decreased urine production. ? Decrease in the force of urine flow.  Muscle twitches and cramps, especially in the legs.  Confusion or trouble concentrating.  Loss of appetite.  Fever. How is this diagnosed? This condition may be diagnosed with tests, including:  Blood tests.  Urine tests.  Imaging tests.  A test in which a sample of tissue is removed from the kidneys to be examined under a microscope (kidney biopsy). How is this treated? Treatment for this condition depends on the cause and how severe the condition is. In mild cases, treatment may not be needed. The kidneys may heal on their own. In more severe cases, treatment will involve:  Treating the cause of the kidney injury. This may involve changing any medicines you are taking or adjusting your dosage.  Fluids. You may need specialized IV fluids to balance your body's needs.  Having a catheter placed to drain urine and prevent blockages.  Preventing problems from occurring. This may mean avoiding certain medicines or procedures that can cause further injury to the kidneys. In some cases treatment may also require:  A procedure to remove toxic wastes from the body (dialysis or continuous renal replacement therapy - CRRT).  Surgery. This may be done to  repair a torn kidney, or to remove the blockage from the urinary system. Follow these instructions at home: Medicines  Take over-the-counter and prescription medicines only as told by your health care provider.  Do not take any new medicines without your health care provider's approval. Many medicines can worsen your kidney damage.  Do not take any vitamin and mineral supplements without your health care provider's approval. Many nutritional supplements can  worsen your kidney damage. Lifestyle  If your health care provider prescribed changes to your diet, follow them. You may need to decrease the amount of protein you eat.  Achieve and maintain a healthy weight. If you need help with this, ask your health care provider.  Start or continue an exercise plan. Try to exercise at least 30 minutes a day, 5 days a week.  Do not use any tobacco products, such as cigarettes, chewing tobacco, and e-cigarettes. If you need help quitting, ask your health care provider. General instructions  Keep track of your blood pressure. Report changes in your blood pressure as told by your health care provider.  Stay up to date with immunizations. Ask your health care provider which immunizations you need.  Keep all follow-up visits as told by your health care provider. This is important. Where to find more information  American Association of Kidney Patients: BombTimer.gl  National Kidney Foundation: www.kidney.[place]: https://mathis.com/  Life Options Rehabilitation [facility]: ? www.lifeoptions.org ? www.kidneyschool.org Contact a health care provider if:  Your symptoms get worse.  You develop new symptoms. Get help right away if:  You develop symptoms of worsening kidney disease, which include: ? Headaches. ? Abnormally dark or light skin. ? Easy bruising. ? Frequent hiccups. ? Chest pain. ? Shortness of breath. ? End of menstruation in women. ? Seizures. ? Confusion or altered mental status. ? Abdominal or back pain. ? Itchiness.  You have a fever.  Your body is producing less urine.  You have pain or bleeding when you urinate. Summary  Acute kidney injury is a sudden worsening of kidney function.  Acute kidney injury can be caused by problems with blood flow to the kidneys, direct damage to the kidneys, and sudden blockage of urine flow.  Symptoms of this condition may not be obvious until it becomes severe. Symptoms  may include edema, lethargy, confusion, nausea or vomiting, and problems passing urine.  This condition can usually be diagnosed with blood tests, urine tests, and imaging tests. Sometimes a kidney biopsy is done to diagnose this condition.  Treatment for this condition often involves treating the underlying cause. It is treated with fluids, medicines, dialysis, diet changes, or surgery. This information is not intended to replace advice given to you by your health care provider. Make sure you discuss any questions you have with your health care provider. Document Released: 12/18/2010 Document Revised: 05/17/2017 Document Reviewed: 05/25/2016 Elsevier Patient Education  2020 [employer].

## 2019-02-07 LAB — COMPREHENSIVE METABOLIC PANEL
AG Ratio: 1.5 (calc) (ref 1.0–2.5)
ALT: 24 U/L (ref 6–29)
AST: 24 U/L (ref 10–35)
Albumin: 4.5 g/dL (ref 3.6–5.1)
Alkaline phosphatase (APISO): 46 U/L (ref 37–153)
BUN/Creatinine Ratio: 14 (calc) (ref 6–22)
BUN: 22 mg/dL (ref 7–25)
CO2: 27 mmol/L (ref 20–32)
Calcium: 10.5 mg/dL — ABNORMAL HIGH (ref 8.6–10.4)
Chloride: 89 mmol/L — ABNORMAL LOW (ref 98–110)
Creat: 1.52 mg/dL — ABNORMAL HIGH (ref 0.60–0.88)
Globulin: 3 g/dL (calc) (ref 1.9–3.7)
Glucose, Bld: 191 mg/dL — ABNORMAL HIGH (ref 65–99)
Potassium: 4.5 mmol/L (ref 3.5–5.3)
Sodium: 129 mmol/L — ABNORMAL LOW (ref 135–146)
Total Bilirubin: 0.9 mg/dL (ref 0.2–1.2)
Total Protein: 7.5 g/dL (ref 6.1–8.1)

## 2019-02-07 LAB — T3, FREE: T3, Free: 2.7 pg/mL (ref 2.3–4.2)

## 2019-02-07 LAB — T4, FREE: Free T4: 1.2 ng/dL (ref 0.8–1.8)

## 2019-02-07 LAB — TSH: TSH: 2.02 mIU/L (ref 0.40–4.50)

## 2019-02-07 LAB — HEMOGLOBIN A1C
Hgb A1c MFr Bld: 9.4 % of total Hgb — ABNORMAL HIGH (ref <5.7)
Mean Plasma Glucose: 223 (calc)
eAG (mmol/L): 12.4 (calc)

## 2019-02-08 LAB — URINALYSIS, ROUTINE W REFLEX MICROSCOPIC
Bilirubin Urine: NEGATIVE
Hgb urine dipstick: NEGATIVE
Ketones, ur: NEGATIVE
Leukocytes,Ua: NEGATIVE
Nitrite: NEGATIVE
Protein, ur: NEGATIVE
Specific Gravity, Urine: 1.006 (ref 1.001–1.03)
pH: 7 (ref 5.0–8.0)

## 2019-02-08 LAB — URINE CULTURE
MICRO NUMBER:: 798057
Result:: NO GROWTH
SPECIMEN QUALITY:: ADEQUATE

## 2019-02-08 LAB — SODIUM, URINE, RANDOM: Sodium, Ur: 23 mmol/L — ABNORMAL LOW (ref 28–272)

## 2019-02-08 LAB — MICROALBUMIN / CREATININE URINE RATIO
Creatinine, Urine: 51 mg/dL (ref 20–275)
Microalb Creat Ratio: 12 mcg/mg creat (ref <30)
Microalb, Ur: 0.6 mg/dL

## 2019-02-09 ENCOUNTER — Ambulatory Visit (INDEPENDENT_AMBULATORY_CARE_PROVIDER_SITE_OTHER): Payer: Medicare Other | Admitting: Podiatry

## 2019-02-09 ENCOUNTER — Other Ambulatory Visit: Payer: Self-pay

## 2019-02-09 ENCOUNTER — Encounter: Payer: Self-pay | Admitting: Podiatry

## 2019-02-09 VITALS — Temp 98.1°F

## 2019-02-09 DIAGNOSIS — M79675 Pain in left toe(s): Secondary | ICD-10-CM

## 2019-02-09 DIAGNOSIS — M79674 Pain in right toe(s): Secondary | ICD-10-CM

## 2019-02-09 DIAGNOSIS — B351 Tinea unguium: Secondary | ICD-10-CM

## 2019-02-09 DIAGNOSIS — E1142 Type 2 diabetes mellitus with diabetic polyneuropathy: Secondary | ICD-10-CM

## 2019-02-09 NOTE — Patient Instructions (Signed)
Diabetes Mellitus and Foot Care Foot care is an important part of your health, especially when you have diabetes. Diabetes may cause you to have problems because of poor blood flow (circulation) to your feet and legs, which can cause your skin to:  Become thinner and drier.  Break more easily.  Heal more slowly.  Peel and crack. You may also have nerve damage (neuropathy) in your legs and feet, causing decreased feeling in them. This means that you may not notice minor injuries to your feet that could lead to more serious problems. Noticing and addressing any potential problems early is the best way to prevent future foot problems. How to care for your feet Foot hygiene  Wash your feet daily with warm water and mild soap. Do not use hot water. Then, pat your feet and the areas between your toes until they are completely dry. Do not soak your feet as this can dry your skin.  Trim your toenails straight across. Do not dig under them or around the cuticle. File the edges of your nails with an emery board or nail file.  Apply a moisturizing lotion or petroleum jelly to the skin on your feet and to dry, brittle toenails. Use lotion that does not contain alcohol and is unscented. Do not apply lotion between your toes. Shoes and socks  Wear clean socks or stockings every day. Make sure they are not too tight. Do not wear knee-high stockings since they may decrease blood flow to your legs.  Wear shoes that fit properly and have enough cushioning. Always look in your shoes before you put them on to be sure there are no objects inside.  To break in new shoes, wear them for just a few hours a day. This prevents injuries on your feet. Wounds, scrapes, corns, and calluses  Check your feet daily for blisters, cuts, bruises, sores, and redness. If you cannot see the bottom of your feet, use a mirror or ask someone for help.  Do not cut corns or calluses or try to remove them with medicine.  If you  find a minor scrape, cut, or break in the skin on your feet, keep it and the skin around it clean and dry. You may clean these areas with mild soap and water. Do not clean the area with peroxide, alcohol, or iodine.  If you have a wound, scrape, corn, or callus on your foot, look at it several times a day to make sure it is healing and not infected. Check for: ? Redness, swelling, or pain. ? Fluid or blood. ? Warmth. ? Pus or a bad smell. General instructions  Do not cross your legs. This may decrease blood flow to your feet.  Do not use heating pads or hot water bottles on your feet. They may burn your skin. If you have lost feeling in your feet or legs, you may not know this is happening until it is too late.  Protect your feet from hot and cold by wearing shoes, such as at the beach or on hot pavement.  Schedule a complete foot exam at least once a year (annually) or more often if you have foot problems. If you have foot problems, report any cuts, sores, or bruises to your health care provider immediately. Contact a health care provider if:  You have a medical condition that increases your risk of infection and you have any cuts, sores, or bruises on your feet.  You have an injury that is not   healing.  You have redness on your legs or feet.  You feel burning or tingling in your legs or feet.  You have pain or cramps in your legs and feet.  Your legs or feet are numb.  Your feet always feel cold.  You have pain around a toenail. Get help right away if:  You have a wound, scrape, corn, or callus on your foot and: ? You have pain, swelling, or redness that gets worse. ? You have fluid or blood coming from the wound, scrape, corn, or callus. ? Your wound, scrape, corn, or callus feels warm to the touch. ? You have pus or a bad smell coming from the wound, scrape, corn, or callus. ? You have a fever. ? You have a red line going up your leg. Summary  Check your feet every day  for cuts, sores, red spots, swelling, and blisters.  Moisturize feet and legs daily.  Wear shoes that fit properly and have enough cushioning.  If you have foot problems, report any cuts, sores, or bruises to your health care provider immediately.  Schedule a complete foot exam at least once a year (annually) or more often if you have foot problems. This information is not intended to replace advice given to you by your health care provider. Make sure you discuss any questions you have with your health care provider. Document Released: 06/01/2000 Document Revised: 07/17/2017 Document Reviewed: 07/06/2016 Elsevier Patient Education  2020 [employer]   Onychomycosis/Fungal Toenails  WHAT IS IT? An infection that lies within the keratin of your nail plate that is caused by a fungus.  WHY ME? Fungal infections affect all ages, sexes, races, and creeds.  There may be many factors that predispose you to a fungal infection such as age, coexisting medical conditions such as diabetes, or an autoimmune disease; stress, medications, fatigue, genetics, etc.  Bottom line: fungus thrives in a warm, moist environment and your shoes offer such a location.  IS IT CONTAGIOUS? Theoretically, yes.  You do not want to share shoes, nail clippers or files with someone who has fungal toenails.  Walking around barefoot in the same room or sleeping in the same bed is unlikely to transfer the organism.  It is important to realize, however, that fungus can spread easily from one nail to the next on the same foot.  HOW DO WE TREAT THIS?  There are several ways to treat this condition.  Treatment may depend on many factors such as age, medications, pregnancy, liver and kidney conditions, etc.  It is best to ask your doctor which options are available to you.  1. No treatment.   Unlike many other medical concerns, you can live with this condition.  However for many people this can be a painful condition and may lead to  ingrown toenails or a bacterial infection.  It is recommended that you keep the nails cut short to help reduce the amount of fungal nail. 2. Topical treatment.  These range from herbal remedies to prescription strength nail lacquers.  About 40-50% effective, topicals require twice daily application for approximately 9 to 12 months or until an entirely new nail has grown out.  The most effective topicals are medical grade medications available through physicians offices. 3. Oral antifungal medications.  With an 80-90% cure rate, the most common oral medication requires 3 to 4 months of therapy and stays in your system for a year as the new nail grows out.  Oral antifungal medications do require   blood work to make sure it is a safe drug for you.  A liver function panel will be performed prior to starting the medication and after the first month of treatment.  It is important to have the blood work performed to avoid any harmful side effects.  In general, this medication safe but blood work is required. 4. Laser Therapy.  This treatment is performed by applying a specialized laser to the affected nail plate.  This therapy is noninvasive, fast, and non-painful.  It is not covered by insurance and is therefore, out of pocket.  The results have been very good with a 80-95% cure rate.  [facility] is the only practice in the area to offer this therapy. 5. Permanent Nail Avulsion.  Removing the entire nail so that a new nail will not grow back. 

## 2019-02-10 ENCOUNTER — Telehealth: Payer: Self-pay | Admitting: Internal Medicine

## 2019-02-10 NOTE — Telephone Encounter (Signed)
Pt had significant drop in GFR per last note. Would you like for her to postpone her eye surgery?

## 2019-02-10 NOTE — Telephone Encounter (Signed)
I wanted her to see renal before eye surgery 02/11/19 due to she will not be able to drive x 1 month and not seen renal in 1 year and decline in GFR   Call pt and renal   TMS

## 2019-02-10 NOTE — Telephone Encounter (Signed)
Pt received a call from a kidney specialist about an urgent referral for stage 3 kidney disease and Pt wanted to know if this is a life or death matter and if she should cancel her eye surgery that is scheduled for tomorrow. If Pt has the surgery tomorrow she will not be able to see the kidney Doctor for another couple of weeks. Pt would like to speak with Dr. Olivia Mackie or the nurse asap about this./ please advise

## 2019-02-11 NOTE — Telephone Encounter (Signed)
Patient stated she is going to have her eye surgery done today. She is not going to cancel. She is going to have someone drive her to her kidney appointments. She has different options of transportation since she will not be able to drive. She is calling nephrology to set up appt.

## 2019-02-16 NOTE — Progress Notes (Signed)
Subjective: Alexis Farmer presents today with painful, thick toenails 1-5 b/l that she cannot cut and which interfere with daily activities.  Pain is aggravated when wearing enclosed shoe gear.  Patient is diabetic. Patient is having eye surgery on Wednesday.  She voices no new pedal concerns on today's visit.  McLean-Scocuzza, Nino Glow, MD is her PCP. Last visit was 02/06/2019.   Current Outpatient Medications:  .  amLODipine (NORVASC) 2.5 MG tablet, Take 1 tablet (2.5 mg total) by mouth daily., Disp: 90 tablet, Rfl: 3 .  blood glucose meter kit and supplies KIT, Accu chek, Dx code E11.65, check 3 times daily, Disp: 1 each, Rfl: 0 .  cloNIDine (CATAPRES) 0.2 MG tablet, TAKE 1 TABLET (0.2 MG TOTAL) BY MOUTH 2 (TWO) TIMES DAILY. (Patient taking differently: Take 0.2 mg by mouth 3 (three) times daily. ), Disp: 180 tablet, Rfl: 3 .  ezetimibe (ZETIA) 10 MG tablet, Take 1 tablet (10 mg total) by mouth daily., Disp: 90 tablet, Rfl: 3 .  glucose blood test strip, Use as instructed, Disp: 100 each, Rfl: 12 .  insulin NPH Human (NOVOLIN N) 100 UNIT/ML injection, INJECT 20 UNITS IN THE MORNING AND 20 UNITS IN THE EVENING WITH A MEAL, Disp: 1200 mL, Rfl: 12 .  insulin regular (NOVOLIN R) 100 units/mL injection, INJECT 15 UNITS TOTAL INTO THE SKIN 3 (THREE) TIMES DAILY BEFORE MEALS., Disp: 43 mL, Rfl: 11 .  Insulin Syringe-Needle U-100 30G X 1/2" 1 ML MISC, 1 Device by Does not apply route 5 (five) times daily. (NPH 2x per day and as needed Novolin R 3x per day), Disp: 500 each, Rfl: 12 .  Multiple Vitamin (MULTI-VITAMIN) tablet, Take by mouth., Disp: , Rfl:  .  Multiple Vitamin (MULTIVITAMIN WITH MINERALS) TABS tablet, Take 1 tablet by mouth daily., Disp: , Rfl:  .  mupirocin ointment (BACTROBAN) 2 %, Apply 1 application topically 2 (two) times daily. Right index finger and left forearm, Disp: 30 g, Rfl: 2 .  nystatin cream (MYCOSTATIN), Apply pea size amount twice daily x 25 days, use with  Triamcinolone, Disp: , Rfl:  .  ofloxacin (OCUFLOX) 0.3 % ophthalmic solution, 1 DROP IN RIGHT EYE FOUR TIMES A DAY BEGIN ONE DAY AFTER SURGERY, CONTINUE AS DIRECTED., Disp: , Rfl:  .  olmesartan (BENICAR) 40 MG tablet, Take 1 tablet (40 mg total) by mouth daily., Disp: 90 tablet, Rfl: 3 .  rosuvastatin (CRESTOR) 10 MG tablet, TAKE 1 TABLET (10 MG TOTAL) BY MOUTH DAILY. (Patient taking differently: Take 10 mg by mouth daily. TAKE 1 TABLET (10 MG TOTAL) BY MOUTH DAILY.), Disp: 90 tablet, Rfl: 3 .  sitaGLIPtin (JANUVIA) 50 MG tablet, Take 0.5 tablets (25 mg total) by mouth daily., Disp: 90 tablet, Rfl: 3 .  spironolactone (ALDACTONE) 25 MG tablet, Take 1 tablet (25 mg total) by mouth daily., Disp: 90 tablet, Rfl: 3 .  triamcinolone ointment (KENALOG) 0.1 %, Apply pea size amount twice daily x 25 days, use with Nystatin, Disp: , Rfl:   Allergies  Allergen Reactions  . Penicillins Rash    Has patient had a PCN reaction causing immediate rash, facial/tongue/throat swelling, SOB or lightheadedness with hypotension: Yes Has patient had a PCN reaction causing severe rash involving mucus membranes or skin necrosis: No Has patient had a PCN reaction that required hospitalization No Has patient had a PCN reaction occurring within the last 10 years: Yes If all of the above answers are "NO", then may proceed with Cephalosporin use.  Objective: Vitals:   02/09/19 0942  Temp: 98.1 F (36.7 C)    Vascular Examination: Capillary refill time immediate x 10 digits.  Dorsalis pedis palpable b/l.  Posterior tibial pulses diminished b/l.  Digital hair absent x 10 digits.   Skin temperature gradient WNL b/l.  Dermatological Examination: Skin with normal turgor, texture and tone b/l.  No open wounds b/l.  No interdigital macerations b/l.  Toenails 1-5 b/l discolored, thick, dystrophic with subungual debris and pain with palpation to nailbeds due to thickness of  nails.  Musculoskeletal: Muscle strength 5/5 to all LE muscle groups  Bilateral pes planus foot deformity.  No pain, crepitus or joint limitation noted with ROM.   Neurological: Sensation diminished with 10 gram monofilament.  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 Toenails 1-5 b/l were debrided in length and girth without iatrogenic bleeding. 2. Patient to continue soft, supportive shoe gear daily. 3. Patient to report any pedal injuries to medical professional immediately. 4. Follow up 10 weeks. 5. Patient/POA to call should there be a concern in the interim.

## 2019-02-20 LAB — BASIC METABOLIC PANEL
BUN: 11 (ref 4–21)
Creatinine: 1 (ref 0.5–1.1)
Glucose: 273
Potassium: 4.8 (ref 3.4–5.3)
Sodium: 138 (ref 137–147)

## 2019-02-25 ENCOUNTER — Ambulatory Visit: Payer: Medicare Other | Admitting: Internal Medicine

## 2019-02-28 LAB — HM MAMMOGRAPHY

## 2019-03-11 ENCOUNTER — Other Ambulatory Visit: Payer: Self-pay

## 2019-03-12 ENCOUNTER — Other Ambulatory Visit: Payer: Self-pay

## 2019-03-12 ENCOUNTER — Encounter: Payer: Self-pay | Admitting: Internal Medicine

## 2019-03-12 ENCOUNTER — Ambulatory Visit (INDEPENDENT_AMBULATORY_CARE_PROVIDER_SITE_OTHER): Payer: Medicare Other | Admitting: Internal Medicine

## 2019-03-12 VITALS — BP 162/70 | HR 95 | Temp 98.1°F | Ht 63.0 in | Wt 254.6 lb

## 2019-03-12 DIAGNOSIS — I10 Essential (primary) hypertension: Secondary | ICD-10-CM

## 2019-03-12 DIAGNOSIS — N183 Chronic kidney disease, stage 3 unspecified: Secondary | ICD-10-CM

## 2019-03-12 DIAGNOSIS — E1165 Type 2 diabetes mellitus with hyperglycemia: Secondary | ICD-10-CM

## 2019-03-12 DIAGNOSIS — R600 Localized edema: Secondary | ICD-10-CM

## 2019-03-12 DIAGNOSIS — I5032 Chronic diastolic (congestive) heart failure: Secondary | ICD-10-CM

## 2019-03-12 DIAGNOSIS — Z23 Encounter for immunization: Secondary | ICD-10-CM

## 2019-03-12 DIAGNOSIS — Z794 Long term (current) use of insulin: Secondary | ICD-10-CM

## 2019-03-12 DIAGNOSIS — H33321 Round hole, right eye: Secondary | ICD-10-CM

## 2019-03-12 DIAGNOSIS — E041 Nontoxic single thyroid nodule: Secondary | ICD-10-CM

## 2019-03-12 DIAGNOSIS — E1122 Type 2 diabetes mellitus with diabetic chronic kidney disease: Secondary | ICD-10-CM

## 2019-03-12 LAB — VITAMIN D 25 HYDROXY (VIT D DEFICIENCY, FRACTURES): VITD: 41.99 ng/mL (ref 30.00–100.00)

## 2019-03-12 MED ORDER — AMLODIPINE BESYLATE 5 MG PO TABS: 5.0000 mg | ORAL_TABLET | Freq: Every day | ORAL | 3 refills | Status: DC

## 2019-03-12 NOTE — Progress Notes (Addendum)
Chief Complaint  Patient presents with  . Follow-up   F/u  1. HTN uncontrolled on norvasc 2.5 mg qd. Clonidine 0.2 bid to tid, benicar 40 mg qd spironolactone 25 mg qd.   2. DM 2  02/06/19 A1C 9.4  Long discussion multiple times with patient about seeing endocrine or changing regimen for diabetes but she declines to do so though stressed importance with recent retinal hole which could be related to diabetes and CKD -pt reports stopped Januvia 25 mg due to did not like taking this medications and ? Compliance with diet and other medications. She changes dose of NPH and Novolin R between visits currenlty reports taking novolin R 15 units bid and novolin N 40 u bid though I previously had her taking 20 units bid of novolin N -she wants smaller needle syringe and 30 x 1/2 " is too big/long and painful per pt and wants test strips accu check guide  -she reports cbgs in the am 100-120s and after lunch 200s and dinner 130s  3. CKD 3 (Dr. Roxanna Mew related to uncontrolled DM and  HTN Renal Dr. Candiss Norse agrees with stop Torsemide  Recent improvement in Creatinine labs 02/19/2019 Dr. Candiss Norse reviewed BUN 11,Cr 1.01 GFR 61 hard to read on paper, Na 138, K 4.8, Cl 101, CO2 27, Calcium 10.5, phostate 3.6, Albumin 4.3, glucose 273 elevated   4. Hypercalcemia intermittent since 06/2018 -will further w/u today with 25 vit D and 1, 25 vitamin D but also advised pt that endocrine can further work this up [place] negative 11/19/2018. Vitamin D normal   06/24/2018 11:35 Calcium: 10.7 (H)  07/02/2018 12:12 Calcium: 9.9  07/07/2018 09:28 Calcium: 9.4  10/28/2018 09:51 Calcium: 11.1 (H)  11/19/2018 09:53 Calcium: 10.1  02/05/2019 10:49 Calcium: 9.5  02/06/2019 14:47 Calcium: 10.5 (H)  03/12/2019 10:56 Calcium: 10.3  5. Thyroid nodules she is due to f/u endocrine but she declines referral for now   Review of Systems  Constitutional: Positive for weight loss.       Trending down since 2018 from 270s since  08/2018 down 1~7-10 lbs    HENT: Negative for hearing loss.   Eyes:       Vision better s/p retinal surgery    Respiratory: Negative for shortness of breath.   Cardiovascular: Positive for leg swelling. Negative for chest pain.  Gastrointestinal: Negative for abdominal pain.  Musculoskeletal: Negative for falls.  Skin: Negative for rash.  Neurological: Negative for headaches.   Past Medical History:  Diagnosis Date  . Arthritis   . Chickenpox   . Diabetes mellitus without complication ([place])    one elevated reading/ no treatment  . Diverticulitis   . GI bleed   . High cholesterol   . History of blood transfusion   . Hypertension   . Renal insufficiency    Past Surgical History:  Procedure Laterality Date  . APPENDECTOMY    . CHOLECYSTECTOMY    . ECTOPIC PREGNANCY SURGERY    . EYE SURGERY     bilateral cataracts  . EYE SURGERY     8 or 02/2019 repair hole in right eye   . gallbladder     . HYSTEROSCOPY W/D&C N/A 10/26/2016   Procedure: DILATATION AND CURETTAGE /HYSTEROSCOPY;  Surgeon: Benjaman Kindler, MD;  Location: [facility] OR[facility];  Service: Gynecology;  Laterality: N/A;  . HYSTEROSCOPY W/D&C N/A 07/07/2018   Procedure: DILATATION AND CURETTAGE /HYSTEROSCOPY;  Surgeon: Benjaman Kindler, MD;  Location: [facility] OR[facility];  Service: Gynecology;  Laterality: N/A;  .  THYROIDECTOMY, PARTIAL     Family History  Problem Relation Age of Onset  . Diabetes Mother   . Hypertension Mother   . Stroke Mother   . Diabetes Other   . Healthy Father   . Diabetes Sister   . Heart disease Sister    Social History   Socioeconomic History  . Marital status: Widowed    Spouse name: Not on file  . Number of children: Not on file  . Years of education: Not on file  . Highest education level: Not on file  Occupational History  . Not on file  Social Needs  . Financial resource strain: Not on file  . Food insecurity    Worry: Not on file    Inability: Not on file  . Transportation needs     Medical: Not on file    Non-medical: Not on file  Tobacco Use  . Smoking status: Never Smoker  . Smokeless tobacco: Never Used  Substance and Sexual Activity  . Alcohol use: No  . Drug use: No  . Sexual activity: Not on file  Lifestyle  . Physical activity    Days per week: Not on file    Minutes per session: Not on file  . Stress: Not on file  Relationships  . Social Herbalist on phone: Not on file    Gets together: Not on file    Attends religious service: Not on file    Active member of club or organization: Not on file    Attends meetings of clubs or organizations: Not on file    Relationship status: Not on file  . Intimate partner violence    Fear of current or ex partner: Not on file    Emotionally abused: Not on file    Physically abused: Not on file    Forced sexual activity: Not on file  Other Topics Concern  . Not on file  Social History Narrative   Lives alone    From [place]   Widowed    Current Meds  Medication Sig  . amLODipine (NORVASC) 5 MG tablet Take 1 tablet (5 mg total) by mouth daily.  . blood glucose meter kit and supplies KIT Accu chek, Dx code E11.65, check 3 times daily  . cloNIDine (CATAPRES) 0.2 MG tablet TAKE 1 TABLET (0.2 MG TOTAL) BY MOUTH 2 (TWO) TIMES DAILY. (Patient taking differently: Take 0.2 mg by mouth 3 (three) times daily. )  . ezetimibe (ZETIA) 10 MG tablet Take 1 tablet (10 mg total) by mouth daily.  Marland Kitchen glucose blood test strip Use as instructed tid  Accuchek Guide  . insulin NPH Human (NOVOLIN N) 100 UNIT/ML injection INJECT 20 UNITS IN THE MORNING AND 20 UNITS IN THE EVENING WITH A MEAL  . insulin regular (NOVOLIN R) 100 units/mL injection INJECT 15 UNITS TOTAL INTO THE SKIN 3 (THREE) TIMES DAILY BEFORE MEALS.  . Multiple Vitamin (MULTI-VITAMIN) tablet Take by mouth.  . Multiple Vitamin (MULTIVITAMIN WITH MINERALS) TABS tablet Take 1 tablet by mouth daily.  . mupirocin ointment (BACTROBAN) 2 % Apply 1 application topically 2  (two) times daily. Right index finger and left forearm  . nystatin cream (MYCOSTATIN) Apply pea size amount twice daily x 25 days, use with Triamcinolone  . ofloxacin (OCUFLOX) 0.3 % ophthalmic solution 1 DROP IN RIGHT EYE FOUR TIMES A DAY BEGIN ONE DAY AFTER SURGERY, CONTINUE AS DIRECTED.  Marland Kitchen olmesartan (BENICAR) 40 MG tablet Take 1 tablet (40 mg total)  by mouth daily.  . rosuvastatin (CRESTOR) 10 MG tablet TAKE 1 TABLET (10 MG TOTAL) BY MOUTH DAILY. (Patient taking differently: Take 10 mg by mouth daily. TAKE 1 TABLET (10 MG TOTAL) BY MOUTH DAILY.)  . spironolactone (ALDACTONE) 25 MG tablet Take 1 tablet (25 mg total) by mouth daily.  Marland Kitchen triamcinolone ointment (KENALOG) 0.1 % Apply pea size amount twice daily x 25 days, use with Nystatin  . [DISCONTINUED] amLODipine (NORVASC) 2.5 MG tablet Take 1 tablet (2.5 mg total) by mouth daily.  . [DISCONTINUED] glucose blood test strip Use as instructed  . [DISCONTINUED] Insulin Syringe-Needle U-100 30G X 1/2" 1 ML MISC 1 Device by Does not apply route 5 (five) times daily. (NPH 2x per day and as needed Novolin R 3x per day)  . [DISCONTINUED] sitaGLIPtin (JANUVIA) 50 MG tablet Take 0.5 tablets (25 mg total) by mouth daily.   Allergies  Allergen Reactions  . Penicillins Rash    Has patient had a PCN reaction causing immediate rash, facial/tongue/throat swelling, SOB or lightheadedness with hypotension: Yes Has patient had a PCN reaction causing severe rash involving mucus membranes or skin necrosis: No Has patient had a PCN reaction that required hospitalization No Has patient had a PCN reaction occurring within the last 10 years: Yes If all of the above answers are "NO", then may proceed with Cephalosporin use.    Recent Results (from the past 2160 hour(s))  Basic metabolic panel     Status: Abnormal   Collection Time: 02/05/19 10:49 AM  Result Value Ref Range   Sodium 132 (L) 135 - 145 mmol/L   Potassium 4.8 3.5 - 5.1 mmol/L   Chloride 94 (L) 98 -  111 mmol/L   CO2 26 22 - 32 mmol/L   Glucose, Bld 279 (H) 70 - 99 mg/dL   BUN 20 8 - 23 mg/dL   Creatinine, Ser 1.91 (H) 0.44 - 1.00 mg/dL   Calcium 9.5 8.9 - 10.3 mg/dL   GFR calc non Af Amer 24 (L) >60 mL/min   GFR calc Af Amer 28 (L) >60 mL/min   Anion gap 12 5 - 15    Comment: Performed at [facility], [address], [place], [place] [address]  Brain natriuretic peptide     Status: None   Collection Time: 02/05/19 10:49 AM  Result Value Ref Range   B Natriuretic Peptide 16.3 0.0 - 100.0 pg/mL    Comment: Performed at [facility] [address] [place], [place] [address]  CBC     Status: None   Collection Time: 02/05/19 11:01 AM  Result Value Ref Range   WBC 7.2 4.0 - 10.5 K/uL   RBC 4.55 3.87 - 5.11 MIL/uL   Hemoglobin 13.4 12.0 - 15.0 g/dL   HCT 40.9 36.0 - 46.0 %   MCV 89.9 80.0 - 100.0 fL   MCH 29.5 26.0 - 34.0 pg   MCHC 32.8 30.0 - 36.0 g/dL   RDW 13.2 11.5 - 15.5 %   Platelets 249 150 - 400 K/uL   nRBC 0.0 0.0 - 0.2 %    Comment: Performed at [facility], [place] [address], [place],  [address]  Comprehensive metabolic panel     Status: Abnormal   Collection Time: 02/06/19  2:47 PM  Result Value Ref Range   Glucose, Bld 191 (H) 65 - 99 mg/dL    Comment: .            Fasting reference  interval . For someone without known diabetes, a glucose value >125 mg/dL indicates that they may have diabetes and this should be confirmed with a follow-up test. .    BUN 22 7 - 25 mg/dL   Creat 1.52 (H) 0.60 - 0.88 mg/dL    Comment: For patients >14 years of age, the reference limit for Creatinine is approximately 13% higher for people identified as African-American. .    BUN/Creatinine Ratio 14 6 - 22 (calc)   Sodium 129 (L) 135 - 146 mmol/L   Potassium 4.5 3.5 - 5.3 mmol/L   Chloride 89 (L) 98 - 110 mmol/L   CO2 27 20 - 32 mmol/L   Calcium 10.5 (H) 8.6 - 10.4 mg/dL   Total Protein 7.5 6.1 - 8.1 g/dL   Albumin 4.5 3.6 - 5.1 g/dL   Globulin  3.0 1.9 - 3.7 g/dL (calc)   AG Ratio 1.5 1.0 - 2.5 (calc)   Total Bilirubin 0.9 0.2 - 1.2 mg/dL   Alkaline phosphatase (APISO) 46 37 - 153 U/L   AST 24 10 - 35 U/L   ALT 24 6 - 29 U/L  Hemoglobin A1c     Status: Abnormal   Collection Time: 02/06/19  2:47 PM  Result Value Ref Range   Hgb A1c MFr Bld 9.4 (H) <5.7 % of total Hgb    Comment: For someone without known diabetes, a hemoglobin A1c value of 6.5% or greater indicates that they may have  diabetes and this should be confirmed with a follow-up  test. . For someone with known diabetes, a value <7% indicates  that their diabetes is well controlled and a value  greater than or equal to 7% indicates suboptimal  control. A1c targets should be individualized based on  duration of diabetes, age, comorbid conditions, and  other considerations. . Currently, no consensus exists regarding use of hemoglobin A1c for diagnosis of diabetes for children. .    Mean Plasma Glucose 223 (calc)   eAG (mmol/L) 12.4 (calc)  T4, free     Status: None   Collection Time: 02/06/19  2:47 PM  Result Value Ref Range   Free T4 1.2 0.8 - 1.8 ng/dL  TSH     Status: None   Collection Time: 02/06/19  2:47 PM  Result Value Ref Range   TSH 2.02 0.40 - 4.50 mIU/L  T3, free     Status: None   Collection Time: 02/06/19  2:47 PM  Result Value Ref Range   T3, Free 2.7 2.3 - 4.2 pg/mL  Urine Culture     Status: None   Collection Time: 02/06/19  2:47 PM   Specimen: Urine  Result Value Ref Range   MICRO NUMBER: [id number]    SPECIMEN QUALITY: Adequate    Sample Source URINE    STATUS: FINAL    Result: No Growth   Urinalysis, Routine w reflex microscopic     Status: Abnormal   Collection Time: 02/06/19  2:47 PM  Result Value Ref Range   Color, Urine YELLOW YELLOW   APPearance CLEAR CLEAR   Specific Gravity, Urine 1.006 1.001 - 1.03   pH 7.0 5.0 - 8.0   Glucose, UA TRACE (A) NEGATIVE   Bilirubin Urine NEGATIVE NEGATIVE   Ketones, ur NEGATIVE NEGATIVE    Hgb urine dipstick NEGATIVE NEGATIVE   Protein, ur NEGATIVE NEGATIVE   Nitrite NEGATIVE NEGATIVE   Leukocytes,Ua NEGATIVE NEGATIVE  Sodium, urine, random     Status: Abnormal   Collection Time: 02/06/19  2:47 PM  Result Value Ref Range   Sodium, Ur 23 (L) 28 - 272 mmol/L  Microalbumin / creatinine urine ratio     Status: None   Collection Time: 02/06/19  2:47 PM  Result Value Ref Range   Creatinine, Urine 51 20 - 275 mg/dL   Microalb, Ur 0.6 mg/dL    Comment: Reference Range Not established    Microalb Creat Ratio 12 <30 mcg/mg creat    Comment: . The ADA defines abnormalities in albumin excretion as follows: Marland Kitchen Category         Result (mcg/mg creatinine) . Normal                    <30 Microalbuminuria         30-299  Clinical albuminuria   > OR = 300 . The ADA recommends that at least two of three specimens collected within a 3-6 month period be abnormal before considering a patient to be within a diagnostic category.   Basic metabolic panel     Status: None   Collection Time: 02/19/19 12:00 AM  Result Value Ref Range   Glucose 273    BUN 11 4 - 21   Creatinine 1.0 0.5 - 1.1   Potassium 4.8 3.4 - 5.3   Sodium 138 137 - 147  HM MAMMOGRAPHY     Status: None   Collection Time: 02/28/19 12:00 AM  Result Value Ref Range   HM Mammogram 0-4 Bi-Rad 0-4 Bi-Rad, Self Reported Normal    Comment: [facility] negative 02/28/19   PTH, Intact and Calcium     Status: Abnormal   Collection Time: 03/12/19 10:56 AM  Result Value Ref Range   PTH 11 (L) 14 - 64 pg/mL    Comment: . Interpretive Guide    Intact PTH           Calcium ------------------    ----------           ------- Normal Parathyroid    Normal               Normal Hypoparathyroidism    Low or Low Normal    Low Hyperparathyroidism    Primary            Normal or High       High    Secondary          High                 Normal or Low    Tertiary           High                 High Non-Parathyroid    Hypercalcemia       Low or Low Normal    High .    Calcium 10.3 8.6 - 10.4 mg/dL  Vitamin D (25 hydroxy)     Status: None   Collection Time: 03/12/19 11:04 AM  Result Value Ref Range   VITD 41.99 30.00 - 100.00 ng/mL  Vitamin D 1,25 dihydroxy     Status: None   Collection Time: 03/12/19 11:04 AM  Result Value Ref Range   Vitamin D 1, 25 (OH)2 Total 30 18 - 72 pg/mL   Vitamin D3 1, 25 (OH)2 30 pg/mL   Vitamin D2 1, 25 (OH)2 <8 pg/mL    Comment: Marland Kitchen Vitamin D3, 1,25(OH)2 indicates both endogenous production and supplementation. Vitamin D2, 1,25(OH)2 is an indicator of exogenous sources,  such as diet or supplementation.  Interpretation and therapy are based on measurement of Vitamin D,1,25(OH)2, Total. . . This test was developed and its analytical performance characteristics have been determined by [facility], [place], [place]. It has not been cleared or approved by the FDA. This assay has been validated pursuant to the CLIA regulations and is used for clinical purposes. .    Objective  Body mass index is 45.1 kg/m. Wt Readings from Last 3 Encounters:  03/12/19 254 lb 9.6 oz (115.5 kg)  08/21/18 261 lb (118.4 kg)  08/13/18 259 lb 12.8 oz (117.8 kg)   Temp Readings from Last 3 Encounters:  03/12/19 98.1 F (36.7 C) (Oral)  02/09/19 98.1 F (36.7 C)  02/05/19 98.6 F (37 C) (Oral)   BP Readings from Last 3 Encounters:  03/12/19 (!) 162/70  02/05/19 (!) 180/87  08/21/18 122/76   Pulse Readings from Last 3 Encounters:  03/12/19 95  02/05/19 96  08/21/18 65    Physical Exam Vitals signs and nursing note reviewed.  Constitutional:      Appearance: Normal appearance. She is well-developed and well-groomed. She is morbidly obese.     Comments: +mask on    HENT:     Head: Normocephalic and atraumatic.  Eyes:     Conjunctiva/sclera: Conjunctivae normal.     Pupils: Pupils are equal, round, and reactive to light.  Cardiovascular:     Rate and Rhythm: Normal  rate and regular rhythm.     Heart sounds: Normal heart sounds. No murmur.  Abdominal:     General: There is no distension.     Palpations: There is no fluid wave.     Tenderness: There is no abdominal tenderness.  Musculoskeletal:     Right lower leg: 2+ Pitting Edema present.     Left lower leg: 2+ Pitting Edema present.  Skin:    General: Skin is warm and dry.  Neurological:     General: No focal deficit present.     Mental Status: She is alert and oriented to person, place, and time. Mental status is at baseline.     Gait: Gait normal.  Psychiatric:        Attention and Perception: Attention and perception normal.        Mood and Affect: Mood and affect normal.        Speech: Speech normal.        Behavior: Behavior normal. Behavior is cooperative.        Thought Content: Thought content normal.        Cognition and Memory: Cognition and memory normal.        Judgment: Judgment normal.     Assessment  Plan  Essential hypertension, uncontrolled - Plan: amLODipine (NORVASC) 5 MG tablet increase from 2.5 mg qd, cont clonidine 0.2 taking bid to tid, benicar 40 mg qd, spironolactone 25 mg qd  -f/u in 1-2 weeks f/u BP  Hypercalcemia - Plan: PTH, Intact and Calcium 10.3 normal as of 03/12/19, Vitamin D (25 hydroxy), Vitamin D 1,25 dihydroxy both normal  Prior ANA, SPEP, UPEP negative 11/2018 03/12/19 25 vitamin D and 1/25 vitamin D normal -consider immobilization as cause  -consider check PTHrP in future  -consider further w/u with endocrine  -reviewed CT ab/pelvis 02/06/19 no etiology  -? If related to medication  -pt should not be taking extra calcium and she is aware   Type 2 diabetes mellitus with hyperglycemia/uncontrolled with long-term current use of insulin, CKD 3 (  BL Cr. 1.1-1.3), recent right retinal hole repair 8 or 02/2019 likely 2/2 uncontrolled DM 2  - A1C 9.4 02/06/19 - Plan: glucose blood test strip accuchek guide, Insulin Syringe-Needle U-100 31G X 5/16" 1 ML MISC  change did not like 30G x 1/2 "  -I rec she change from NPH and R Insulin to newer better options but she declines  -she stopped JAnuvia 25 mg qd on her own  -I also rec she see endocrine du eto DM being uncontrolled x years she declines I have emphasized multiple x for multiple reasons (DM, Thyroid nodules, hypercalcemia) I rec she f/u endocrine and she declines today and reports she will call when ready so given #   Cont Novolin N 20 u bid this is what she was supposed to be taking as of 02/06/2019  rec do NPH R moderate scale instead of 15 bid and rec take bid to tid  Regular insulin scale dosing - If sugar is  131-180 4 units  181 to 240 8 units  241-300 10 units  301-350 12 units  351-400 16 units >400 20 units  Of note Saw renal D. 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 creatinine 1.14 GFR 55 11/2018 off torsemide and volume status controlled checked renal panel  rec off ACEI/ARB due to fluctuating renal function  Cont norvasc 2.5 mg qd and spironolactone 25 mg qd  F/u in 6 months and rec good BP control   Pedal edema -will avoid torsemide due to AKI in the past  Rx knee highs compression stockings 15-20 mmHg zip up Rx   Morbid obesity ([place]) -rec healthy diet and exercise   Chronic diastolic CHF (congestive heart failure) (HCC) -cont spironolactone 25 mg qd  -will hold torsemide due to recently caused AKI -rec reduce total fluids for the day to no more than 50 oz.  -rec control BP as above   Thyroid nodule   Ref. Range 02/06/2019 14:47  TSH Latest Ref Range: 0.40 - 4.50 mIU/L 2.02  Triiodothyronine,Free,Serum Latest Ref Range: 2.3 - 4.2 pg/mL 2.7  T4,Free(Direct) Latest Ref Range: 0.8 - 1.8 ng/dL 1.2  02/03/18 thyroid bx benign bx left middle and lower thyroid -rec f/u [facility] pt declines referral for now  Retinal hole of right eye s/p surgery 8 or 02/2019  -doing well had f/u 03/11/19   HM Flu shotgiven today   Check on  other vaccines (I.e Tdap, prevnar, pna 23, shingrix)   mammo had 02/28/19 normal [facility]  Pap had 05/28/18 negative [facility] 07/07/18 endometrial bx neg and same 10/26/16   DEXA pt declines for now  -no extra Ca h/o hyperCa   [facility] Dr. Allen [facility] -09/10/14 diverticulosis sessile polyp=inflammatory and EGD chronic active gastritis    CT chest none on file never smoker  CXR 01/20/18 pulm vascular congestion otherwise neg   02/03/18 thyroid bx negative   Of note CT renal 07/16/16 c/w bulging disc and L4/5 spinal stenosis   [facility] saw 03/29/19 closed fx lateral malleolus right fibular broken ankle Carlynn Spry [facility]   Provider: Dr. Olivia Mackie McLean-Scocuzza-Internal Medicine

## 2019-03-12 NOTE — Progress Notes (Signed)
Pre visit review using our clinic review tool, if applicable. No additional management support is needed unless otherwise documented below in the visit note. 

## 2019-03-12 NOTE — Patient Instructions (Addendum)
Germain Osgood, MD  [facility] [place], [place] [address]  [phone]  [phone] (Fax  Call to make appt diabetes doctor please    Component Name 02/19/2019 03/17/2018   11 13  10.5 (H) 9.6  3.6 3.0  1.01 (H) 1.19 (H)  53 (L) 43 (L)  61 50 (L)  138 138  4.8 4.8  4.3 4.0  273 (H) 259 (H)  11 15  101 99  27 31   1.8  BUN/Creatinine Ratio  Calcium  Phosphorus  Creatinine  eGFR Non-African  eGFR African  Sodium  Potassium  Albumin  Glucose  BUN  Chloride  Bicarbonate (CO2)  Magnesium    Hypercalcemia Hypercalcemia is when the level of calcium in a person's blood is above normal. The body needs calcium to make bones and keep them strong. Calcium also helps the muscles, nerves, brain, and heart work the way they should. Most of the calcium in the body is in the bones. There is also some calcium in the blood. Hypercalcemia can happen when calcium comes out of the bones, or when the kidneys are not able to remove calcium from the blood. Hypercalcemia can be mild or severe. What are the causes? There are many possible causes of hypercalcemia. Common causes of this condition include:  Hyperparathyroidism. This is a condition in which the body produces too much parathyroid hormone. There are four parathyroid glands in your neck. These glands produce a chemical messenger (hormone) that helps the body absorb calcium from foods and helps your bones release calcium.  Certain kinds of cancer. Less common causes of hypercalcemia include:  Getting too much calcium or vitamin D from your diet.  Kidney failure.  Hyperthyroidism.  Severe dehydration.  Being on bed rest or being inactive for a long time.  Certain medicines.  Infections. What increases the risk? You are more likely to develop this condition if you:  Are female.  Are 42 years of age or older.  Have a family history of hypercalcemia. What are the signs or  symptoms? Mild hypercalcemia that starts slowly may not cause symptoms. Severe, sudden hypercalcemia is more likely to cause symptoms, such as:  Being more thirsty than usual.  Needing to urinate more often than usual.  Abdominal pain.  Nausea and vomiting.  Constipation.  Muscle pain, twitching, or weakness.  Feeling very tired. How is this diagnosed?  Hypercalcemia is usually diagnosed with a blood test. You may also have tests to help determine what is causing this condition, such as imaging tests and more blood tests. How is this treated? Treatment for hypercalcemia depends on the cause. Treatment may include:  Receiving fluids through an IV.  Medicines that: ? Keep calcium levels steady after receiving fluids (loop diuretics). ? Keep calcium in your bones (bisphosphonates). ? Lower the calcium level in your blood.  Surgery to remove overactive parathyroid glands.  A procedure that filters your blood to correct calcium levels (hemodialysis). Follow these instructions at home:   Take over-the-counter and prescription medicines only as told by your health care provider.  Follow instructions from your health care provider about eating or drinking restrictions.  Drink enough fluid to keep your urine pale yellow.  Stay active. Weight-bearing exercise helps to keep calcium in your bones. Follow instructions from your health care provider about what type and level of exercise is safe for you.  Keep all follow-up visits as told by your health care provider. This is important.  Contact a health care provider if you have:  A fever.  A heartbeat that is irregular or very fast.  Changes in mood, memory, or personality. Get help right away if you:  Have severe abdominal pain.  Have chest pain.  Have trouble breathing.  Become very confused and sleepy.  Lose consciousness. Summary  Hypercalcemia is when the level of calcium in a person's blood is above normal. The  body needs calcium to make bones and keep them strong. Calcium also helps the muscles, nerves, brain, and heart work the way they should.  There are many possible causes of hypercalcemia, and treatment depends on the cause.  Take over-the-counter and prescription medicines only as told by your health care provider.  Follow instructions from your health care provider about eating or drinking restrictions. This information is not intended to replace advice given to you by your health care provider. Make sure you discuss any questions you have with your health care provider. Document Released: 08/18/2004 Document Revised: 07/01/2018 Document Reviewed: 03/10/2018 Elsevier Patient Education  2020 [employer].   Regular insulin scale dosing  If sugar is  131-180 4 units  181 to 240 8 units  241-300 10 units  301-350 12 units  351-400 16 units >400 20 units

## 2019-03-13 LAB — PTH, INTACT AND CALCIUM
Calcium: 10.3 mg/dL (ref 8.6–10.4)
PTH: 11 pg/mL — ABNORMAL LOW (ref 14–64)

## 2019-03-16 ENCOUNTER — Encounter: Payer: Self-pay | Admitting: Internal Medicine

## 2019-03-16 LAB — VITAMIN D 1,25 DIHYDROXY
Vitamin D 1, 25 (OH)2 Total: 30 pg/mL (ref 18–72)
Vitamin D2 1, 25 (OH)2: <8 pg/mL
Vitamin D3 1, 25 (OH)2: 30 pg/mL

## 2019-03-18 ENCOUNTER — Other Ambulatory Visit: Payer: Self-pay | Admitting: Internal Medicine

## 2019-03-18 ENCOUNTER — Encounter: Payer: Self-pay | Admitting: Internal Medicine

## 2019-03-18 DIAGNOSIS — E1122 Type 2 diabetes mellitus with diabetic chronic kidney disease: Secondary | ICD-10-CM

## 2019-03-18 DIAGNOSIS — N183 Chronic kidney disease, stage 3 unspecified: Secondary | ICD-10-CM

## 2019-03-18 DIAGNOSIS — Z794 Long term (current) use of insulin: Secondary | ICD-10-CM

## 2019-03-18 DIAGNOSIS — I10 Essential (primary) hypertension: Secondary | ICD-10-CM

## 2019-03-18 MED ORDER — GLUCOSE BLOOD VI STRP: ORAL_STRIP | 12 refills | Status: DC

## 2019-03-18 MED ORDER — "INSULIN SYRINGE-NEEDLE U-100 31G X 5/16"" 1 ML MISC": 1.0000 | Freq: Four times a day (QID) | 12 refills | Status: DC

## 2019-03-20 ENCOUNTER — Ambulatory Visit (INDEPENDENT_AMBULATORY_CARE_PROVIDER_SITE_OTHER): Payer: Medicare Other

## 2019-03-20 ENCOUNTER — Other Ambulatory Visit: Payer: Self-pay

## 2019-03-20 ENCOUNTER — Ambulatory Visit (HOSPITAL_COMMUNITY)
Admission: EM | Admit: 2019-03-20 | Discharge: 2019-03-20 | Disposition: A | Discharge status: Home / Self Care | Payer: Medicare Other | Attending: Family Medicine | Admitting: Family Medicine

## 2019-03-20 ENCOUNTER — Encounter (HOSPITAL_COMMUNITY): Payer: Self-pay

## 2019-03-20 DIAGNOSIS — S0081XA Abrasion of other part of head, initial encounter: Secondary | ICD-10-CM | POA: Diagnosis not present

## 2019-03-20 DIAGNOSIS — X501XXA Overexertion from prolonged static or awkward postures, initial encounter: Secondary | ICD-10-CM

## 2019-03-20 DIAGNOSIS — W1809XA Striking against other object with subsequent fall, initial encounter: Secondary | ICD-10-CM

## 2019-03-20 DIAGNOSIS — S92254A Nondisplaced fracture of navicular [scaphoid] of right foot, initial encounter for closed fracture: Secondary | ICD-10-CM

## 2019-03-20 NOTE — ED Provider Notes (Signed)
[facility]    CSN: [id number] Arrival date & time: 03/20/19  1438      History   Chief Complaint Chief Complaint  Patient presents with  . Fall  . Head Injury  . Ankle Pain    Right    HPI Alexis Farmer is a 78 y.o. female.   Patient is an 78 year old female with past medical history of arthritis, chickenpox, diabetes, diverticulitis, GI bleed, high cholesterol, hypertension.  She presents today for fall.  Reports she was walking and fell while trying to get back into the house today.  She missed a step.  She hit her head on which she reports as " soft part of the house near the concrete".  She also twisted her right ankle in the fall.  She was using a cane during this and the cane gave out on her.  Sometimes she uses a walker at home.  Denies any loss of consciousness after hitting her head.  Denies any dizziness, severe headache,  nausea, vomiting or any concerning red flags.  She has an abrasion to the left frontal region of her head and scalp. No hematoma, loss of vision of eye swelling.   ROS per HPI    Fall  Head Injury Ankle Pain   Past Medical History:  Diagnosis Date  . Arthritis   . Chickenpox   . Diabetes mellitus without complication ([facility])    one elevated reading/ no treatment  . Diverticulitis   . GI bleed   . High cholesterol   . History of blood transfusion   . Hypertension   . Renal insufficiency     Patient Active Problem List   Diagnosis Date Noted  . Hypercalcemia 03/18/2019  . Retinal hole of right eye 02/06/2019  . 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 Disorder of right rotator cuff 06/24/2018  . Abnormal gait 03/26/2018  . Fall 03/26/2018  . Arthritis 03/26/2018  . Thyroid nodule 01/20/2018  . Dysuria 03/22/2017  . Cervical spondylosis with radiculopathy 01/26/2017  . Rectal bleed 07/29/2016  . Carotid stenosis 07/19/2016  . PAD (peripheral artery disease) ([facility]) 07/19/2016  . Pain and  swelling of lower leg 07/19/2016  . Bradycardia 07/17/2016  . Postmenopausal bleeding 07/17/2016  . Chronic diastolic CHF (congestive heart failure) ([facility]) 05/31/2016  . Cough 01/25/2016  . Chronic fatigue 11/15/2015  . Depression 10/13/2015  . Osteoarthritis 10/13/2015  . Vertigo 10/13/2015  . Anxiety 09/13/2015  . GI bleed 08/03/2015  . Type 2 diabetes mellitus with stage 3 chronic kidney disease, with long-term current use of insulin ([facility]) 03/11/2015  . Mixed hyperlipidemia 08/26/2014  . Essential hypertension 04/08/2014  . Diabetes mellitus type II, uncontrolled ([facility]) 04/08/2014  . CKD (chronic kidney disease) stage 3, GFR 30-59 ml/min ([facility]) 04/08/2014  . Pedal edema 04/08/2014  . Morbid obesity ([facility]) 04/08/2014  . Lower back pain 04/08/2014  . Diabetic polyneuropathy ([facility]) 04/08/2014    Past Surgical History:  Procedure Laterality Date  . APPENDECTOMY    . CHOLECYSTECTOMY    . ECTOPIC PREGNANCY SURGERY    . EYE SURGERY     bilateral cataracts  . EYE SURGERY     02/11/2019 repair hole in right eye   . gallbladder     . HYSTEROSCOPY W/D&C N/A 10/26/2016   Procedure: DILATATION AND CURETTAGE /HYSTEROSCOPY;  Surgeon: Benjaman Kindler, MD;  Location: [facility] ORS;  Service: Gynecology;  Laterality: N/A;  . HYSTEROSCOPY W/D&C N/A 07/07/2018   Procedure: DILATATION AND CURETTAGE /  HYSTEROSCOPY;  Surgeon: Benjaman Kindler, MD;  Location: [facility] ORS;  Service: Gynecology;  Laterality: N/A;  . THYROIDECTOMY, PARTIAL      OB History   No obstetric history on file.      Home Medications    Prior to Admission medications   Medication Sig Start Date End Date Taking? Authorizing Provider  amLODipine (NORVASC) 5 MG tablet Take 1 tablet (5 mg total) by mouth daily. 03/12/19   McLean-Scocuzza, Nino Glow, MD  blood glucose meter kit and supplies KIT Accu chek, Dx code E11.65, check 3 times daily 07/26/17   Leone Haven, MD  cloNIDine (CATAPRES) 0.2 MG tablet TAKE 1 TABLET (0.2 MG TOTAL) BY  MOUTH 2 (TWO) TIMES DAILY. Patient taking differently: Take 0.2 mg by mouth 3 (three) times daily.  02/03/16   Minna Merritts, MD  ezetimibe (ZETIA) 10 MG tablet Take 1 tablet (10 mg total) by mouth daily. 10/29/18   McLean-Scocuzza, Nino Glow, MD  glucose blood test strip Use as instructed tid  Accuchek Guide 03/18/19   McLean-Scocuzza, Nino Glow, MD  insulin NPH Human (NOVOLIN N) 100 UNIT/ML injection INJECT 20 UNITS IN THE MORNING AND 20 UNITS IN THE EVENING WITH A MEAL 12/22/18   McLean-Scocuzza, Nino Glow, MD  insulin regular (NOVOLIN R) 100 units/mL injection INJECT 15 UNITS TOTAL INTO THE SKIN 3 (THREE) TIMES DAILY BEFORE MEALS. 09/03/18   McLean-Scocuzza, Nino Glow, MD  Insulin Syringe-Needle U-100 31G X 5/16" 1 ML MISC 1 Device by Does not apply route 4 (four) times daily. 03/18/19   McLean-Scocuzza, Nino Glow, MD  Multiple Vitamin (MULTI-VITAMIN) tablet Take by mouth.    [provider]  Multiple Vitamin (MULTIVITAMIN WITH MINERALS) TABS tablet Take 1 tablet by mouth daily.    [provider]  mupirocin ointment (BACTROBAN) 2 % Apply 1 application topically 2 (two) times daily. Right index finger and left forearm 12/18/18   McLean-Scocuzza, Nino Glow, MD  nystatin cream (MYCOSTATIN) Apply pea size amount twice daily x 25 days, use with Triamcinolone 11/18/18   [provider]  ofloxacin (OCUFLOX) 0.3 % ophthalmic solution 1 DROP IN RIGHT EYE FOUR TIMES A DAY BEGIN ONE DAY AFTER SURGERY, CONTINUE AS DIRECTED. 01/22/19   [provider]  olmesartan (BENICAR) 40 MG tablet Take 1 tablet (40 mg total) by mouth daily. 11/13/18   McLean-Scocuzza, Nino Glow, MD  rosuvastatin (CRESTOR) 10 MG tablet TAKE 1 TABLET (10 MG TOTAL) BY MOUTH DAILY. Patient taking differently: Take 10 mg by mouth daily. TAKE 1 TABLET (10 MG TOTAL) BY MOUTH DAILY. 11/25/17   Leone Haven, MD  spironolactone (ALDACTONE) 25 MG tablet Take 1 tablet (25 mg total) by mouth daily. 11/13/18   McLean-Scocuzza, Nino Glow,  MD  triamcinolone ointment (KENALOG) 0.1 % Apply pea size amount twice daily x 25 days, use with Nystatin 11/18/18   [provider]    Family History Family History  Problem Relation Age of Onset  . Diabetes Mother   . Hypertension Mother   . Stroke Mother   . Diabetes Other   . Healthy Father   . Diabetes Sister   . Heart disease Sister     Social History Social History   Tobacco Use  . Smoking status: Never Smoker  . Smokeless tobacco: Never Used  Substance Use Topics  . Alcohol use: No  . Drug use: No     Allergies   Penicillins   Review of Systems Review of Systems   Physical Exam  Triage Vital Signs ED Triage Vitals  Enc Vitals Group     BP 03/20/19 1517 133/84     Pulse Rate 03/20/19 1517 87     Resp 03/20/19 1517 16     Temp 03/20/19 1517 98.5 F (36.9 C)     Temp Source 03/20/19 1517 Oral     SpO2 03/20/19 1517 94 %     Weight --      Height --      Head Circumference --      Peak Flow --      Pain Score 03/20/19 1520 6     Pain Loc --      Pain Edu? --      Excl. in [place]? --    No data found.  Updated Vital Signs BP 133/84 (BP Location: Right Arm)   Pulse 87   Temp 98.5 F (36.9 C) (Oral)   Resp 16   SpO2 94%   Visual Acuity Right Eye Distance:   Left Eye Distance:   Bilateral Distance:    Right Eye Near:   Left Eye Near:    Bilateral Near:     Physical Exam Vitals signs and nursing note reviewed.  Constitutional:      General: She is not in acute distress.    Appearance: Normal appearance. She is not ill-appearing, toxic-appearing or diaphoretic.  HENT:     Head: Normocephalic. Abrasion present.      Nose: Nose normal.     Mouth/Throat:     Pharynx: Oropharynx is clear.  Eyes:     Extraocular Movements: Extraocular movements intact.     Conjunctiva/sclera: Conjunctivae normal.     Pupils: Pupils are equal, round, and reactive to light.  Neck:     Musculoskeletal: Normal range of motion and neck supple.   Cardiovascular:     Pulses: Normal pulses.  Pulmonary:     Effort: Pulmonary effort is normal.  Musculoskeletal:     Right ankle: She exhibits swelling. She exhibits normal range of motion, no ecchymosis, no deformity, no laceration and normal pulse. Tenderness. Lateral malleolus and CF ligament tenderness found. No medial malleolus, no AITFL, no posterior TFL, no head of 5th metatarsal and no proximal fibula tenderness found.       Feet:  Skin:    General: Skin is warm and dry.  Neurological:     General: No focal deficit present.     Mental Status: She is alert.     Cranial Nerves: No cranial nerve deficit.     Motor: No weakness.     Comments: No focal neuro deficits. Strength 5/5 Alert and oriented Speech clear   Psychiatric:        Mood and Affect: Mood normal.      UC Treatments / Results  Labs (all labs ordered are listed, but only abnormal results are displayed) Labs Reviewed - No data to display  EKG   Radiology Dg Ankle Complete Right  Result Date: 03/20/2019 CLINICAL DATA:  Injury from fall, right ankle pain. EXAM: RIGHT ANKLE - COMPLETE 3+ VIEW COMPARISON:  None. FINDINGS: Cortical regularity along the medial aspect of navicular seen on only one view. No additional bony abnormality or fracture sign from degenerative change. IMPRESSION: Small presumed avulsion fracture of the medial navicular. Dedicated views of the foot may be helpful. Electronically Signed   By: Zetta Bills M.D.   On: 03/20/2019 16:01    Procedures Procedures (including critical care time)  Medications Ordered in UC  Medications - No data to display  Initial Impression / Assessment and Plan / UC Course  I have reviewed the triage vital signs and the nursing notes.  Pertinent labs & imaging results that were available during my care of the patient were reviewed by me and considered in my medical decision making (see chart for details).     Navicular fracture- putting on ankle air  cast and will have her follow up with ortho RICE She is neurologically intact.  No concern for concussion or intracranial abnormality.  We will have her rest, monitor symptoms and follow-up as needed. Return precautions given  Final Clinical Impressions(s) / UC Diagnoses   Final diagnoses:  Closed nondisplaced fracture of navicular bone of right foot, initial encounter     Discharge Instructions     You have a small fracture to the right ankle.  We will place you in a ankle Aircast Please rest, ice and elevate the ankle as much as possible try to be nonweightbearing as much as possible. You can take Tylenol as needed for pain. No concerns for concussion or intracranial abnormality today. Please monitor for symptoms to include severe headache, dizziness, blurred vision, nausea, vomiting If you experience this you  will need to go the ER. Other wise follow up with your doctor     ED Prescriptions    None     PDMP not reviewed this encounter.   Orvan July, NP 03/20/19 1623

## 2019-03-20 NOTE — Discharge Instructions (Addendum)
You have a small fracture to the right ankle.  We will place you in a ankle Aircast Please rest, ice and elevate the ankle as much as possible try to be nonweightbearing as much as possible. You can take Tylenol as needed for pain. No concerns for concussion or intracranial abnormality today. Please monitor for symptoms to include severe headache, dizziness, blurred vision, nausea, vomiting If you experience this you  will need to go the ER. Other wise follow up with your doctor

## 2019-03-20 NOTE — ED Triage Notes (Signed)
Pt presents with right ankle injury and head abrasion after tripping and falling today while trying to get back in the house.

## 2019-03-31 ENCOUNTER — Telehealth: Payer: Self-pay | Admitting: Gastroenterology

## 2019-03-31 NOTE — Telephone Encounter (Signed)
Patient called & would like to know when her next colonoscopy is due?

## 2019-04-01 NOTE — Telephone Encounter (Signed)
Left vm informing pt due to her age and based on the results of her previous colonoscopy on 09/10/14, Dr. Allen Norris did not recommend repeating her colonoscopy unless she has change of bowel habits, rectal bleeding, weight loss or any other GI symptoms.

## 2019-04-02 ENCOUNTER — Telehealth: Payer: Self-pay | Admitting: Gastroenterology

## 2019-04-02 NOTE — Telephone Encounter (Signed)
Patient called stating  she is returning [place] call. Please call patient sounds very anxious.

## 2019-04-03 NOTE — Telephone Encounter (Signed)
Contacted pt and discussed with her why she no longer has to undergo a colonoscopy. Pt understood and will contact our office is she develops any issues.

## 2019-04-07 ENCOUNTER — Other Ambulatory Visit: Payer: Self-pay | Admitting: Internal Medicine

## 2019-04-07 ENCOUNTER — Telehealth: Payer: Self-pay | Admitting: Internal Medicine

## 2019-04-07 NOTE — Telephone Encounter (Signed)
Pt's vertigo  has come back and is requesting a refill on her medication for that issue.

## 2019-04-08 ENCOUNTER — Other Ambulatory Visit: Payer: Self-pay | Admitting: Internal Medicine

## 2019-04-08 DIAGNOSIS — R42 Dizziness and giddiness: Secondary | ICD-10-CM

## 2019-04-08 DIAGNOSIS — H814 Vertigo of central origin: Secondary | ICD-10-CM

## 2019-04-08 DIAGNOSIS — I10 Essential (primary) hypertension: Secondary | ICD-10-CM

## 2019-04-08 MED ORDER — MECLIZINE HCL 12.5 MG PO TABS: 12.5000 mg | ORAL_TABLET | Freq: Every day | ORAL | 0 refills | Status: DC | PRN

## 2019-04-08 NOTE — Telephone Encounter (Signed)
I rec we do CT head to rule out stroke with her uncontrolled blood pressure goal is <130/<80 What has her bp been at home ??? CT order and will be scheduled for [place]   [place] schedule in office f/u after CT head  Will send in temp supply of meclizine until we can get CT head done   [place]

## 2019-04-09 ENCOUNTER — Telehealth: Payer: Self-pay | Admitting: Internal Medicine

## 2019-04-09 NOTE — Telephone Encounter (Signed)
I highly rec this to rule out stroke  Inform rasheeda   [place]

## 2019-04-09 NOTE — Telephone Encounter (Signed)
Alexis Farmer has been informed

## 2019-04-09 NOTE — Telephone Encounter (Signed)
Patients BP yesterday, 04/08/2019 was 130/79. Patient is declining CT at this time.

## 2019-04-09 NOTE — Telephone Encounter (Signed)
She will let me know if she changes her mind to go.

## 2019-04-13 NOTE — Telephone Encounter (Signed)
Pt declined scheduling CT

## 2019-04-20 ENCOUNTER — Other Ambulatory Visit: Payer: Self-pay

## 2019-04-20 ENCOUNTER — Ambulatory Visit (INDEPENDENT_AMBULATORY_CARE_PROVIDER_SITE_OTHER): Payer: Medicare Other | Admitting: Podiatry

## 2019-04-20 ENCOUNTER — Encounter: Payer: Self-pay | Admitting: Podiatry

## 2019-04-20 DIAGNOSIS — B351 Tinea unguium: Secondary | ICD-10-CM

## 2019-04-20 DIAGNOSIS — M79674 Pain in right toe(s): Secondary | ICD-10-CM | POA: Diagnosis not present

## 2019-04-20 DIAGNOSIS — M79675 Pain in left toe(s): Secondary | ICD-10-CM

## 2019-04-20 NOTE — Patient Instructions (Signed)
Diabetes Mellitus and Foot Care Foot care is an important part of your health, especially when you have diabetes. Diabetes may cause you to have problems because of poor blood flow (circulation) to your feet and legs, which can cause your skin to:  Become thinner and drier.  Break more easily.  Heal more slowly.  Peel and crack. You may also have nerve damage (neuropathy) in your legs and feet, causing decreased feeling in them. This means that you may not notice minor injuries to your feet that could lead to more serious problems. Noticing and addressing any potential problems early is the best way to prevent future foot problems. How to care for your feet Foot hygiene  Wash your feet daily with warm water and mild soap. Do not use hot water. Then, pat your feet and the areas between your toes until they are completely dry. Do not soak your feet as this can dry your skin.  Trim your toenails straight across. Do not dig under them or around the cuticle. File the edges of your nails with an emery board or nail file.  Apply a moisturizing lotion or petroleum jelly to the skin on your feet and to dry, brittle toenails. Use lotion that does not contain alcohol and is unscented. Do not apply lotion between your toes. Shoes and socks  Wear clean socks or stockings every day. Make sure they are not too tight. Do not wear knee-high stockings since they may decrease blood flow to your legs.  Wear shoes that fit properly and have enough cushioning. Always look in your shoes before you put them on to be sure there are no objects inside.  To break in new shoes, wear them for just a few hours a day. This prevents injuries on your feet. Wounds, scrapes, corns, and calluses  Check your feet daily for blisters, cuts, bruises, sores, and redness. If you cannot see the bottom of your feet, use a mirror or ask someone for help.  Do not cut corns or calluses or try to remove them with medicine.  If you  find a minor scrape, cut, or break in the skin on your feet, keep it and the skin around it clean and dry. You may clean these areas with mild soap and water. Do not clean the area with peroxide, alcohol, or iodine.  If you have a wound, scrape, corn, or callus on your foot, look at it several times a day to make sure it is healing and not infected. Check for: ? Redness, swelling, or pain. ? Fluid or blood. ? Warmth. ? Pus or a bad smell. General instructions  Do not cross your legs. This may decrease blood flow to your feet.  Do not use heating pads or hot water bottles on your feet. They may burn your skin. If you have lost feeling in your feet or legs, you may not know this is happening until it is too late.  Protect your feet from hot and cold by wearing shoes, such as at the beach or on hot pavement.  Schedule a complete foot exam at least once a year (annually) or more often if you have foot problems. If you have foot problems, report any cuts, sores, or bruises to your health care provider immediately. Contact a health care provider if:  You have a medical condition that increases your risk of infection and you have any cuts, sores, or bruises on your feet.  You have an injury that is not   healing.  You have redness on your legs or feet.  You feel burning or tingling in your legs or feet.  You have pain or cramps in your legs and feet.  Your legs or feet are numb.  Your feet always feel cold.  You have pain around a toenail. Get help right away if:  You have a wound, scrape, corn, or callus on your foot and: ? You have pain, swelling, or redness that gets worse. ? You have fluid or blood coming from the wound, scrape, corn, or callus. ? Your wound, scrape, corn, or callus feels warm to the touch. ? You have pus or a bad smell coming from the wound, scrape, corn, or callus. ? You have a fever. ? You have a red line going up your leg. Summary  Check your feet every day  for cuts, sores, red spots, swelling, and blisters.  Moisturize feet and legs daily.  Wear shoes that fit properly and have enough cushioning.  If you have foot problems, report any cuts, sores, or bruises to your health care provider immediately.  Schedule a complete foot exam at least once a year (annually) or more often if you have foot problems. This information is not intended to replace advice given to you by your health care provider. Make sure you discuss any questions you have with your health care provider. Document Released: 06/01/2000 Document Revised: 07/17/2017 Document Reviewed: 07/06/2016 Elsevier Patient Education  2020 [employer]  

## 2019-04-24 NOTE — Progress Notes (Signed)
Subjective: Alexis Farmer is seen today for diabetic foot care follow up painful, elongated, thickened toenails 1-5 b/l feet that she cannot cut.   She states she had a fall on March 20, 2019. She was going upstairs to her home and fell. She recalls hitting her forehead.  She also recalls hitting her right ankle. She went to ED and was diagnosed with fx of navicular and given an AirCast ankle splint.  She then saw [facility] in [place] and was diagnosed with fx of lateral malleolus. She was placed in soft cast. She is now back wearing her regular shoe gear and wears her AirCast Ankle splint when needed per Ortho instructions.  Current Outpatient Medications on File Prior to Visit  Medication Sig  . amLODipine (NORVASC) 5 MG tablet Take 1 tablet (5 mg total) by mouth daily.  . Ascorbic Acid (VITAMIN C) 1000 MG tablet Take 1,000 mg by mouth daily.  . blood glucose meter kit and supplies KIT Accu chek, Dx code E11.65, check 3 times daily  . cloNIDine (CATAPRES) 0.2 MG tablet TAKE 1 TABLET (0.2 MG TOTAL) BY MOUTH 2 (TWO) TIMES DAILY. (Patient taking differently: Take 0.2 mg by mouth 3 (three) times daily. )  . ezetimibe (ZETIA) 10 MG tablet Take 1 tablet (10 mg total) by mouth daily.  Marland Kitchen glucose blood test strip Use as instructed tid  Accuchek Guide  . insulin NPH Human (NOVOLIN N) 100 UNIT/ML injection INJECT 20 UNITS IN THE MORNING AND 20 UNITS IN THE EVENING WITH A MEAL  . insulin regular (NOVOLIN R) 100 units/mL injection INJECT 15 UNITS TOTAL INTO THE SKIN 3 (THREE) TIMES DAILY BEFORE MEALS.  Marland Kitchen Insulin Syringe-Needle U-100 31G X 5/16" 1 ML MISC 1 Device by Does not apply route 4 (four) times daily.  . meclizine (ANTIVERT) 12.5 MG tablet Take 1 tablet (12.5 mg total) by mouth daily as needed for dizziness.  . Multiple Vitamin (MULTI-VITAMIN) tablet Take by mouth.  . Multiple Vitamin (MULTIVITAMIN WITH MINERALS) TABS tablet Take 1 tablet by mouth daily.  . mupirocin ointment (BACTROBAN) 2 %  Apply 1 application topically 2 (two) times daily. Right index finger and left forearm  . nystatin cream (MYCOSTATIN) Apply pea size amount twice daily x 25 days, use with Triamcinolone  . ofloxacin (OCUFLOX) 0.3 % ophthalmic solution 1 DROP IN RIGHT EYE FOUR TIMES A DAY BEGIN ONE DAY AFTER SURGERY, CONTINUE AS DIRECTED.  Marland Kitchen olmesartan (BENICAR) 40 MG tablet Take 1 tablet (40 mg total) by mouth daily.  Marland Kitchen PREDNISOLONE ACETATE P-F 1 % ophthalmic suspension Place 1 drop into the right eye 4 (four) times daily.  . rosuvastatin (CRESTOR) 10 MG tablet TAKE 1 TABLET (10 MG TOTAL) BY MOUTH DAILY. (Patient taking differently: Take 10 mg by mouth daily. TAKE 1 TABLET (10 MG TOTAL) BY MOUTH DAILY.)  . spironolactone (ALDACTONE) 25 MG tablet Take 1 tablet (25 mg total) by mouth daily.  Marland Kitchen triamcinolone ointment (KENALOG) 0.1 % Apply pea size amount twice daily x 25 days, use with Nystatin   No current facility-administered medications on file prior to visit.      Allergies  Allergen Reactions  . Penicillins Rash    Has patient had a PCN reaction causing immediate rash, facial/tongue/throat swelling, SOB or lightheadedness with hypotension: Yes Has patient had a PCN reaction causing severe rash involving mucus membranes or skin necrosis: No Has patient had a PCN reaction that required hospitalization No Has patient had a PCN reaction occurring within the last  10 years: Yes If all of the above answers are "NO", then may proceed with Cephalosporin use.      Objective: 78 y.o. AAF, morbidly obese in NAD. AAO x 3.   Vascular Examination: Capillary refill time immediate x 10 digits.  Dorsalis pedis present b/l.  Posterior tibial pulses diminished b/l.  Digital hair absent b/l.  Trace edema right ankle. No ecchymosis. No warmth. No pain on palpation.   Skin temperature gradient WNL b/l.   Dermatological Examination: Skin with normal turgor, texture and tone b/l.  Toenails 1-5 b/l discolored,  thick, dystrophic with subungual debris and pain with palpation to nailbeds due to thickness of nails.  Musculoskeletal: Muscle strength 5/5 to all LE muscle groups b/l.  Pes planus foot deformity b/l.   No pain, crepitus or joint limitation noted with ROM.   Neurological Examination: Protective sensation diminished b/l.   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 Toenails 1-5 b/l were debrided in length and girth without iatrogenic bleeding. 2. Patient to continue soft, supportive shoe gear 3. Patient to report any pedal injuries to medical professional immediately. 4. Follow up 10 weeks. 5. Patient/POA to call should there be a concern in the interim.

## 2019-06-09 DIAGNOSIS — Z6841 Body Mass Index (BMI) 40.0 and over, adult: Secondary | ICD-10-CM | POA: Insufficient documentation

## 2019-06-11 ENCOUNTER — Ambulatory Visit: Payer: Medicare Other | Admitting: Internal Medicine

## 2019-06-22 ENCOUNTER — Other Ambulatory Visit: Payer: Self-pay

## 2019-06-22 ENCOUNTER — Other Ambulatory Visit: Payer: Self-pay | Admitting: Nephrology

## 2019-06-22 ENCOUNTER — Ambulatory Visit
Admission: RE | Admit: 2019-06-22 | Discharge: 2019-06-22 | Disposition: A | Discharge status: Home / Self Care | Payer: Medicare Other | Source: Ambulatory Visit | Attending: Nephrology | Admitting: Nephrology

## 2019-06-22 DIAGNOSIS — I10 Essential (primary) hypertension: Secondary | ICD-10-CM | POA: Insufficient documentation

## 2019-06-26 ENCOUNTER — Ambulatory Visit: Payer: Medicare Other | Admitting: Internal Medicine

## 2019-07-09 ENCOUNTER — Other Ambulatory Visit: Payer: Self-pay

## 2019-07-15 ENCOUNTER — Ambulatory Visit: Payer: Medicare Other | Admitting: Internal Medicine

## 2019-07-24 ENCOUNTER — Other Ambulatory Visit: Payer: Self-pay

## 2019-07-24 ENCOUNTER — Encounter: Payer: Self-pay | Admitting: Podiatry

## 2019-07-24 ENCOUNTER — Ambulatory Visit (INDEPENDENT_AMBULATORY_CARE_PROVIDER_SITE_OTHER): Payer: Medicare Other | Admitting: Podiatry

## 2019-07-24 DIAGNOSIS — M79674 Pain in right toe(s): Secondary | ICD-10-CM

## 2019-07-24 DIAGNOSIS — B351 Tinea unguium: Secondary | ICD-10-CM

## 2019-07-24 DIAGNOSIS — M79675 Pain in left toe(s): Secondary | ICD-10-CM | POA: Diagnosis not present

## 2019-07-24 DIAGNOSIS — I700 Atherosclerosis of aorta: Secondary | ICD-10-CM | POA: Insufficient documentation

## 2019-07-24 NOTE — Patient Instructions (Signed)
Diabetes Mellitus and Foot Care Foot care is an important part of your health, especially when you have diabetes. Diabetes may cause you to have problems because of poor blood flow (circulation) to your feet and legs, which can cause your skin to:  Become thinner and drier.  Break more easily.  Heal more slowly.  Peel and crack. You may also have nerve damage (neuropathy) in your legs and feet, causing decreased feeling in them. This means that you may not notice minor injuries to your feet that could lead to more serious problems. Noticing and addressing any potential problems early is the best way to prevent future foot problems. How to care for your feet Foot hygiene  Wash your feet daily with warm water and mild soap. Do not use hot water. Then, pat your feet and the areas between your toes until they are completely dry. Do not soak your feet as this can dry your skin.  Trim your toenails straight across. Do not dig under them or around the cuticle. File the edges of your nails with an emery board or nail file.  Apply a moisturizing lotion or petroleum jelly to the skin on your feet and to dry, brittle toenails. Use lotion that does not contain alcohol and is unscented. Do not apply lotion between your toes. Shoes and socks  Wear clean socks or stockings every day. Make sure they are not too tight. Do not wear knee-high stockings since they may decrease blood flow to your legs.  Wear shoes that fit properly and have enough cushioning. Always look in your shoes before you put them on to be sure there are no objects inside.  To break in new shoes, wear them for just a few hours a day. This prevents injuries on your feet. Wounds, scrapes, corns, and calluses  Check your feet daily for blisters, cuts, bruises, sores, and redness. If you cannot see the bottom of your feet, use a mirror or ask someone for help.  Do not cut corns or calluses or try to remove them with medicine.  If you  find a minor scrape, cut, or break in the skin on your feet, keep it and the skin around it clean and dry. You may clean these areas with mild soap and water. Do not clean the area with peroxide, alcohol, or iodine.  If you have a wound, scrape, corn, or callus on your foot, look at it several times a day to make sure it is healing and not infected. Check for: ? Redness, swelling, or pain. ? Fluid or blood. ? Warmth. ? Pus or a bad smell. General instructions  Do not cross your legs. This may decrease blood flow to your feet.  Do not use heating pads or hot water bottles on your feet. They may burn your skin. If you have lost feeling in your feet or legs, you may not know this is happening until it is too late.  Protect your feet from hot and cold by wearing shoes, such as at the beach or on hot pavement.  Schedule a complete foot exam at least once a year (annually) or more often if you have foot problems. If you have foot problems, report any cuts, sores, or bruises to your health care provider immediately. Contact a health care provider if:  You have a medical condition that increases your risk of infection and you have any cuts, sores, or bruises on your feet.  You have an injury that is not   healing.  You have redness on your legs or feet.  You feel burning or tingling in your legs or feet.  You have pain or cramps in your legs and feet.  Your legs or feet are numb.  Your feet always feel cold.  You have pain around a toenail. Get help right away if:  You have a wound, scrape, corn, or callus on your foot and: ? You have pain, swelling, or redness that gets worse. ? You have fluid or blood coming from the wound, scrape, corn, or callus. ? Your wound, scrape, corn, or callus feels warm to the touch. ? You have pus or a bad smell coming from the wound, scrape, corn, or callus. ? You have a fever. ? You have a red line going up your leg. Summary  Check your feet every day  for cuts, sores, red spots, swelling, and blisters.  Moisturize feet and legs daily.  Wear shoes that fit properly and have enough cushioning.  If you have foot problems, report any cuts, sores, or bruises to your health care provider immediately.  Schedule a complete foot exam at least once a year (annually) or more often if you have foot problems. This information is not intended to replace advice given to you by your health care provider. Make sure you discuss any questions you have with your health care provider. Document Revised: 02/25/2019 Document Reviewed: 07/06/2016 Elsevier Patient Education  2020 [employer]  

## 2019-07-28 NOTE — Progress Notes (Signed)
Subjective: Alexis Farmer presents today for follow up of preventative diabetic foot care and painful mycotic nails b/l that are difficult to trim. Pain interferes with ambulation. Aggravating factors include wearing enclosed shoe gear. Pain is relieved with periodic professional debridement.   Allergies  Allergen Reactions  . Penicillins Rash    Has patient had a PCN reaction causing immediate rash, facial/tongue/throat swelling, SOB or lightheadedness with hypotension: Yes Has patient had a PCN reaction causing severe rash involving mucus membranes or skin necrosis: No Has patient had a PCN reaction that required hospitalization No Has patient had a PCN reaction occurring within the last 10 years: Yes If all of the above answers are "NO", then may proceed with Cephalosporin use.      Objective: There were no vitals filed for this visit.  Vascular Examination:  Capillary refill time to digits immediate b/l, palpable DP pulses b/l, non-palpable PT pulses b/l, pedal hair absent b/l and skin temperature gradient within normal limits b/l  Dermatological Examination: Pedal skin with normal turgor, texture and tone bilaterally, no open wounds bilaterally, no interdigital macerations bilaterally and toenails 1-5 b/l elongated, dystrophic, thickened, crumbly with subungual debris  Musculoskeletal: Normal muscle strength 5/5 to all lower extremity muscle groups bilaterally, no pain crepitus or joint limitation noted with ROM b/l and pes planus deformity noted  Neurological: Protective sensation absent with 10g monofilament b/l  Assessment: Pain due to onychomycosis of toenails of both feet  Plan: -Continue diabetic foot care principles. Literature dispensed on today.  -Toenails 1-5 b/l were debrided in length and girth without iatrogenic bleeding. -Patient to continue soft, supportive shoe gear daily. -Patient to report any pedal injuries to medical professional  immediately. -Patient/POA to call should there be question/concern in the interim.  Return in about 10 weeks (around 10/02/2019) for diabetic nail trim.

## 2019-08-09 ENCOUNTER — Ambulatory Visit: Payer: Medicare Other | Attending: Internal Medicine

## 2019-08-09 DIAGNOSIS — Z23 Encounter for immunization: Secondary | ICD-10-CM | POA: Insufficient documentation

## 2019-08-09 NOTE — Progress Notes (Signed)
   Covid-[facility] Vaccination [facility]  Name:  Alexis Farmer    MRN: [id number] DOB: 1941-04-04  08/09/2019  [name] was observed post Covid-19 immunization for 15 minutes without incidence. She was provided with Vaccine Information Sheet and instruction to access the V-Safe system.   [name] was instructed to call 911 with any severe reactions post vaccine: Marland Kitchen Difficulty breathing  . Swelling of your face and throat  . A fast heartbeat  . A bad rash all over your body  . Dizziness and weakness    Immunizations Administered    Name Date Dose VIS Date Route   Pfizer COVID-19 Vaccine 08/09/2019  2:14 PM 0.3 mL 05/29/2019 Intramuscular   Manufacturer: [place]   Lot: [id number]   [place]: [id number]

## 2019-08-13 ENCOUNTER — Other Ambulatory Visit: Payer: Self-pay

## 2019-08-13 ENCOUNTER — Encounter: Payer: Self-pay | Admitting: Internal Medicine

## 2019-08-13 ENCOUNTER — Ambulatory Visit (INDEPENDENT_AMBULATORY_CARE_PROVIDER_SITE_OTHER): Payer: Medicare Other | Admitting: Internal Medicine

## 2019-08-13 VITALS — BP 142/80 | HR 67 | Temp 96.8°F | Ht 63.0 in | Wt 253.4 lb

## 2019-08-13 DIAGNOSIS — R296 Repeated falls: Secondary | ICD-10-CM | POA: Diagnosis not present

## 2019-08-13 DIAGNOSIS — I10 Essential (primary) hypertension: Secondary | ICD-10-CM

## 2019-08-13 DIAGNOSIS — R600 Localized edema: Secondary | ICD-10-CM

## 2019-08-13 DIAGNOSIS — N1832 Chronic kidney disease, stage 3b: Secondary | ICD-10-CM

## 2019-08-13 DIAGNOSIS — M150 Primary generalized (osteo)arthritis: Secondary | ICD-10-CM

## 2019-08-13 DIAGNOSIS — R5381 Other malaise: Secondary | ICD-10-CM

## 2019-08-13 DIAGNOSIS — E162 Hypoglycemia, unspecified: Secondary | ICD-10-CM

## 2019-08-13 DIAGNOSIS — E785 Hyperlipidemia, unspecified: Secondary | ICD-10-CM

## 2019-08-13 DIAGNOSIS — R269 Unspecified abnormalities of gait and mobility: Secondary | ICD-10-CM

## 2019-08-13 DIAGNOSIS — Z794 Long term (current) use of insulin: Secondary | ICD-10-CM

## 2019-08-13 DIAGNOSIS — M8949 Other hypertrophic osteoarthropathy, multiple sites: Secondary | ICD-10-CM

## 2019-08-13 DIAGNOSIS — M159 Polyosteoarthritis, unspecified: Secondary | ICD-10-CM

## 2019-08-13 DIAGNOSIS — N183 Chronic kidney disease, stage 3 unspecified: Secondary | ICD-10-CM

## 2019-08-13 DIAGNOSIS — F330 Major depressive disorder, recurrent, mild: Secondary | ICD-10-CM

## 2019-08-13 DIAGNOSIS — E1165 Type 2 diabetes mellitus with hyperglycemia: Secondary | ICD-10-CM

## 2019-08-13 DIAGNOSIS — E1121 Type 2 diabetes mellitus with diabetic nephropathy: Secondary | ICD-10-CM

## 2019-08-13 DIAGNOSIS — E041 Nontoxic single thyroid nodule: Secondary | ICD-10-CM

## 2019-08-13 DIAGNOSIS — IMO0002 Reserved for concepts with insufficient information to code with codable children: Secondary | ICD-10-CM

## 2019-08-13 DIAGNOSIS — E1122 Type 2 diabetes mellitus with diabetic chronic kidney disease: Secondary | ICD-10-CM

## 2019-08-13 HISTORY — DX: Repeated falls: R29.6

## 2019-08-13 MED ORDER — GLUCOSE 4 G PO CHEW: 1.0000 | CHEWABLE_TABLET | ORAL | 12 refills | Status: DC | PRN

## 2019-08-13 MED ORDER — "BD INSULIN SYRINGE ULTRAFINE 30G X 1/2"" 0.5 ML MISC": 1.0000 | Freq: Every day | 12 refills | Status: DC

## 2019-08-13 MED ORDER — ROSUVASTATIN CALCIUM 10 MG PO TABS: ORAL_TABLET | ORAL | 3 refills | Status: DC

## 2019-08-13 MED ORDER — INSULIN REGULAR HUMAN 100 UNIT/ML IJ SOLN: INTRAMUSCULAR | 11 refills | Status: DC

## 2019-08-13 MED ORDER — CLONIDINE HCL 0.2 MG PO TABS: 0.2000 mg | ORAL_TABLET | Freq: Three times a day (TID) | ORAL | 3 refills | Status: DC

## 2019-08-13 NOTE — Patient Instructions (Addendum)
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 She is actually taking: NPH 15-20 units bid and Regular 0-10 units bid before meals.   As of visit 05/2019    Long acting NPH (long) 20 units 2x per day if dropping 20 units in am and 15 units in pm   rec do NPH Regular 131-180 2 units  181 to 240 4 units  241-300 6units  301-350 8 units  351-400 10 units >400 12 units   Keep glucose tablets  Hypoglycemia Hypoglycemia occurs when the level of sugar (glucose) in the blood is too low. Hypoglycemia can happen in people who do or do not have diabetes. It can develop quickly, and it can be a medical emergency. For most people with diabetes, a blood glucose level below 70 mg/dL (3.9 mmol/L) is considered hypoglycemia. Glucose is a type of sugar that provides the body's main source of energy. Certain hormones (insulin and glucagon) control the level of glucose in the blood. Insulin lowers blood glucose, and glucagon raises blood glucose. Hypoglycemia can result from having too much insulin in the bloodstream, or from not eating enough food that contains glucose. You may also have reactive hypoglycemia, which happens within 4 hours after eating a meal. What are the causes? Hypoglycemia occurs most often in people who have diabetes and may be caused by:  Diabetes medicine.  Not eating enough, or not eating often enough.  Increased physical activity.  Drinking alcohol on an empty stomach. If you do not have diabetes, hypoglycemia may be caused by:  A tumor in the pancreas.  Not eating enough, or not eating for long periods at a time (fasting).  A severe infection or illness.  Certain medicines. What increases the risk? Hypoglycemia is more likely to develop in:  People who have diabetes and take medicines to lower blood glucose.  People who abuse alcohol.  People who have a severe illness. What are  the signs or symptoms? Mild symptoms Mild hypoglycemia may not cause any symptoms. If you do have symptoms, they may include:  Hunger.  Anxiety.  Sweating and feeling clammy.  Dizziness or feeling light-headed.  Sleepiness.  Nausea.  Increased heart rate.  Headache.  Blurry vision.  Irritability.  Tingling or numbness around the mouth, lips, or tongue.  A change in coordination.  Restless sleep. Moderate symptoms Moderate hypoglycemia can cause:  Mental confusion and poor judgment.  Behavior changes.  Weakness.  Irregular heartbeat. Severe symptoms Severe hypoglycemia is a medical emergency. It can cause:  Fainting.  Seizures.  Loss of consciousness (coma).  Death. How is this diagnosed? Hypoglycemia is diagnosed with a blood test to measure your blood glucose level. This blood test is done while you are having symptoms. Your health care provider may also do a physical exam and review your medical history. How is this treated? This condition can often be treated by immediately eating or drinking something that contains sugar, such as:  Fruit juice, 4-6 oz (120-150 mL).  Regular soda (not diet soda), 4-6 oz (120-150 mL).  Low-fat milk, 4 oz (120 mL).  Several pieces of hard candy.  Sugar or honey, 1 Tbsp (15 mL). Treating hypoglycemia if you have diabetes If you are alert and able to swallow safely, follow the 15:15 rule:  Take 15 grams of a rapid-acting carbohydrate. Talk with your health care provider about how much you should take.  Rapid-acting  options include: ? Glucose pills (take 15 grams). ? 6-8 pieces of hard candy. ? 4-6 oz (120-150 mL) of fruit juice. ? 4-6 oz (120-150 mL) of regular (not diet) soda. ? 1 Tbsp (15 mL) honey or sugar.  Check your blood glucose 15 minutes after you take the carbohydrate.  If the repeat blood glucose level is still at or below 70 mg/dL (3.9 mmol/L), take 15 grams of a carbohydrate again.  If your  blood glucose level does not increase above 70 mg/dL (3.9 mmol/L) after 3 tries, seek emergency medical care.  After your blood glucose level returns to normal, eat a meal or a snack within 1 hour.  Treating severe hypoglycemia Severe hypoglycemia is when your blood glucose level is at or below 54 mg/dL (3 mmol/L). Severe hypoglycemia is a medical emergency. Get medical help right away. If you have severe hypoglycemia and you cannot eat or drink, you may need an injection of glucagon. A family member or close friend should learn how to check your blood glucose and how to give you a glucagon injection. Ask your health care provider if you need to have an emergency glucagon injection kit available. Severe hypoglycemia may need to be treated in a hospital. The treatment may include getting glucose through an IV. You may also need treatment for the cause of your hypoglycemia. Follow these instructions at home:  General instructions  Take over-the-counter and prescription medicines only as told by your health care provider.  Monitor your blood glucose as told by your health care provider.  Limit alcohol intake to no more than 1 drink a day for nonpregnant women and 2 drinks a day for men. One drink equals 12 oz of beer (355 mL), 5 oz of wine (148 mL), or 1 oz of hard liquor (44 mL).  Keep all follow-up visits as told by your health care provider. This is important. If you have diabetes:  Always have a rapid-acting carbohydrate snack with you to treat low blood glucose.  Follow your diabetes management plan as directed. Make sure you: ? Know the symptoms of hypoglycemia. It is important to treat it right away to prevent it from becoming severe. ? Take your medicines as directed. ? Follow your exercise plan. ? Follow your meal plan. Eat on time, and do not skip meals. ? Check your blood glucose as often as directed. Always check before and after exercise. ? Follow your sick day plan whenever  you cannot eat or drink normally. Make this plan in advance with your health care provider.  Share your diabetes management plan with people in your workplace, school, and household.  Check your urine for ketones when you are ill and as told by your health care provider.  Carry a medical alert card or wear medical alert jewelry. Contact a health care provider if:  You have problems keeping your blood glucose in your target range.  You have frequent episodes of hypoglycemia. Get help right away if:  You continue to have hypoglycemia symptoms after eating or drinking something containing glucose.  Your blood glucose is at or below 54 mg/dL (3 mmol/L).  You have a seizure.  You faint. These symptoms may represent a serious problem that is an emergency. Do not wait to see if the symptoms will go away. Get medical help right away. Call your local emergency services (911 in the [place] Summary  Hypoglycemia occurs when the level of sugar (glucose) in the blood is too low.  Hypoglycemia can  happen in people who do or do not have diabetes. It can develop quickly, and it can be a medical emergency.  Make sure you know the symptoms of hypoglycemia and how to treat it.  Always have a rapid-acting carbohydrate snack with you to treat low blood sugar. This information is not intended to replace advice given to you by your health care provider. Make sure you discuss any questions you have with your health care provider. Document Revised: 11/25/2017 Document Reviewed: 07/08/2015 Elsevier Patient Education  2020 [employer].

## 2019-08-13 NOTE — Progress Notes (Signed)
Chief Complaint  Patient presents with  . Follow-up  . Referral    for home health aide   F/u 1. Falls 2 months ago has cement stair at her home and she has rollator today she is also unable to do bathing, cleaning cooking and needs more help at home  She does not want [facility] in [place] This is making her feel sad health decline, falls and relationship with her daughter who recently moved to [place] from [place] 2. Vaginal bleeding given progestin 08/11/19 Dr.Beasley did not pick up for now she states it has stopped and never started the medication  3. Chronic leg edema though better today L>R did not pick up compression stockings and has appt Monday with vascular disc with her today to disc lymph press machine  4. 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 HTN on norvasc 5 mg qd, clonidine 0.2 tid, benicar 40, spiro 25 mg qd, torsemide 10 mg qd BP improved   Review of Systems  Constitutional: Negative for weight loss.  HENT: Negative for hearing loss.   Eyes: Negative for photophobia.  Respiratory: Negative for shortness of breath.   Cardiovascular: Positive for leg swelling. Negative for chest pain.  Gastrointestinal: Negative for abdominal pain.  Genitourinary:       +vaginal bleeding resolved for now    Musculoskeletal: Positive for falls and joint pain.  Skin: Negative for rash.  Psychiatric/Behavioral: Positive for depression.   Past Medical History:  Diagnosis Date  . Arthritis   . Chickenpox   . Diabetes mellitus without complication ([place])    one elevated reading/ no treatment  . Diverticulitis   . GI bleed   . High cholesterol   . History of blood transfusion   . Hypertension   . Renal insufficiency    Past Surgical History:  Procedure Laterality Date  . APPENDECTOMY    . CHOLECYSTECTOMY    . ECTOPIC PREGNANCY SURGERY    . EYE  SURGERY     bilateral cataracts  . EYE SURGERY     02/11/2019 repair hole in right eye   . gallbladder     . HYSTEROSCOPY WITH D & C N/A 10/26/2016   Procedure: DILATATION AND CURETTAGE /HYSTEROSCOPY;  Surgeon: Benjaman Kindler, MD;  Location: [facility] ORS;  Service: Gynecology;  Laterality: N/A;  . HYSTEROSCOPY WITH D & C N/A 07/07/2018   Procedure: DILATATION AND CURETTAGE /HYSTEROSCOPY;  Surgeon: Benjaman Kindler, MD;  Location: [facility] ORS;  Service: Gynecology;  Laterality: N/A;  . THYROIDECTOMY, PARTIAL     Family History  Problem Relation Age of Onset  . Diabetes Mother   . Hypertension Mother   . Stroke Mother   . Diabetes Other   . Healthy Father   . Diabetes Sister   . Heart disease Sister    Social History   Socioeconomic History  . Marital status: Widowed    Spouse name: Not on file  . Number of children: Not on file  . Years of education: Not on file  . Highest education level: Not on file  Occupational History  . Not on file  Tobacco Use  . Smoking status: Never Smoker  . Smokeless tobacco: Never Used  Substance and Sexual Activity  . Alcohol use: No  . Drug use: No  . Sexual activity:  Not on file  Other Topics Concern  . Not on file  Social History Narrative   Lives alone    From [place]   Widowed    Social Determinants of Health   Financial Resource Strain:   . Difficulty of Paying Living Expenses: Not on file  Food Insecurity:   . Worried About Charity fundraiser in the Last Year: Not on file  . Ran Out of Food in the Last Year: Not on file  Transportation Needs:   . Lack of Transportation (Medical): Not on file  . Lack of Transportation (Non-Medical): Not on file  Physical Activity:   . Days of Exercise per Week: Not on file  . Minutes of Exercise per Session: Not on file  Stress:   . Feeling of Stress : Not on file  Social Connections:   . Frequency of Communication with Friends and Family: Not on file  . Frequency of Social Gatherings with Friends  and Family: Not on file  . Attends Religious Services: Not on file  . Active Member of Clubs or Organizations: Not on file  . Attends Archivist Meetings: Not on file  . Marital Status: Not on file  Intimate Partner Violence:   . Fear of Current or Ex-Partner: Not on file  . Emotionally Abused: Not on file  . Physically Abused: Not on file  . Sexually Abused: Not on file   Current Meds  Medication Sig  . amLODipine (NORVASC) 5 MG tablet Take 1 tablet (5 mg total) by mouth daily.  . Ascorbic Acid (VITAMIN C) 1000 MG tablet Take 500 mg by mouth daily.   . blood glucose meter kit and supplies KIT Accu chek, Dx code E11.65, check 3 times daily  . cloNIDine (CATAPRES) 0.2 MG tablet Take 1 tablet (0.2 mg total) by mouth 3 (three) times daily.  . insulin NPH Human (NOVOLIN N) 100 UNIT/ML injection INJECT 20 UNITS IN THE MORNING AND 20 UNITS IN THE EVENING WITH A MEAL  . insulin regular (NOVOLIN R) 100 units/mL injection INJECT 15 UNITS TOTAL INTO THE SKIN 3 (THREE) TIMES DAILY BEFORE MEALS.  . meclizine (ANTIVERT) 12.5 MG tablet Take 1 tablet (12.5 mg total) by mouth daily as needed for dizziness.  . Multiple Vitamin (MULTIVITAMIN WITH MINERALS) TABS tablet Take 1 tablet by mouth daily.  Marland Kitchen spironolactone (ALDACTONE) 25 MG tablet Take 1 tablet (25 mg total) by mouth daily.  Marland Kitchen torsemide (DEMADEX) 10 MG tablet Take 10 mg by mouth daily as needed.  . [DISCONTINUED] cloNIDine (CATAPRES) 0.2 MG tablet TAKE 1 TABLET (0.2 MG TOTAL) BY MOUTH 2 (TWO) TIMES DAILY. (Patient taking differently: Take 0.2 mg by mouth 3 (three) times daily. )   Allergies  Allergen Reactions  . Penicillins Rash    Has patient had a PCN reaction causing immediate rash, facial/tongue/throat swelling, SOB or lightheadedness with hypotension: Yes Has patient had a PCN reaction causing severe rash involving mucus membranes or skin necrosis: No Has patient had a PCN reaction that required hospitalization No Has patient  had a PCN reaction occurring within the last 10 years: Yes If all of the above answers are "NO", then may proceed with Cephalosporin use.    No results found for this or any previous visit (from the past 2160 hour(s)). Objective  Body mass index is 44.89 kg/m. Wt Readings from Last 3 Encounters:  08/13/19 253 lb 6.4 oz (114.9 kg)  03/12/19 254 lb 9.6 oz (115.5 kg)  08/21/18 261 lb (  118.4 kg)   Temp Readings from Last 3 Encounters:  08/13/19 (!) 96.8 F (36 C) (Temporal)  03/20/19 98.5 F (36.9 C) (Oral)  03/12/19 98.1 F (36.7 C) (Oral)   BP Readings from Last 3 Encounters:  08/13/19 (!) 142/80  03/20/19 133/84  03/12/19 (!) 162/70   Pulse Readings from Last 3 Encounters:  08/13/19 67  03/20/19 87  03/12/19 95    Physical Exam Vitals and nursing note reviewed.  Constitutional:      Appearance: Normal appearance. She is well-developed and well-groomed. She is obese.  HENT:     Head: Normocephalic and atraumatic.  Eyes:     Conjunctiva/sclera: Conjunctivae normal.     Pupils: Pupils are equal, round, and reactive to light.  Cardiovascular:     Rate and Rhythm: Normal rate and regular rhythm.     Heart sounds: Normal heart sounds. No murmur.  Pulmonary:     Effort: Pulmonary effort is normal.     Breath sounds: Normal breath sounds.  Abdominal:     General: Abdomen is flat. Bowel sounds are normal.  Musculoskeletal:     Right lower leg: 1+ Pitting Edema present.     Left lower leg: 1+ Pitting Edema present.  Skin:    General: Skin is warm and dry.  Neurological:     General: No focal deficit present.     Mental Status: She is alert and oriented to person, place, and time. Mental status is at baseline.     Gait: Gait normal.     Comments: Walking with walker today    Psychiatric:        Attention and Perception: Attention and perception normal.        Mood and Affect: Mood and affect normal.        Speech: Speech normal.        Behavior: Behavior normal.  Behavior is cooperative.        Thought Content: Thought content normal.        Cognition and Memory: Cognition and memory normal.        Judgment: Judgment normal.     Assessment  Plan  Falls frequently with physical deconditioning - Plan: Ambulatory referral to [place] PT/OT/RN/aid  Rx rollator brakes and seat  Essential hypertension with CKD3a/b improved  - Plan: cloNIDine (CATAPRES) 0.2 MG tablet tid with other meds, Ambulatory referral to [place] other meds norvasc 5 mg qd, spironolactone 25 mg qd, benicar 40 mg (per pt not taking), torsemide 10 mg daily prn left leg swelling -clarify with renal if they d/c benicar?   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 Gabriel Carina rec she can change to Dr. Honor Junes but sh edoes not want to for now  Pedal edema - Plan: Ambulatory referral to [place] F/u vascular Monday 08/17/19   Mild episode of recurrent major depressive disorder ([place])  Declines meds for now   Primary osteoarthritis involving multiple joints - Plan: Ambulatory referral to [place]  HM sch fasting labs Flu shotutd 1/2 covid vxs had  Check on other vaccines (I.e Tdap, prevnar, pna 23, shingrix)   mammo had 02/28/19 normal [facility]  Pap had 05/28/18 negative [place]  OB/GYN 07/07/18 endometrial bx neg and same 10/26/16  Of and off bleeding f/u ob/gyn and given progestin 08/11/19 did not pick up   DEXA pt declines for now  -no extra Ca h/o hyperCa   [facility] Allen Norris [facility] -09/10/14 diverticulosis sessile polyp=inflammatory and EGD chronic active gastritis   CT chest none on file never smoker  CXR 01/20/18 pulm vascular congestion otherwise neg  cxr 06/22/19 with scarring   02/03/18 thyroid bx negative   Of note CT renal 07/16/16 c/w bulging disc and L4/5 spinal stenosis   [facility] saw 03/29/19 closed fx lateral malleolus right fibular broken ankle [facility] [place] urgent care   Provider: Dr. Olivia Mackie McLean-Scocuzza-Internal Medicine

## 2019-08-14 ENCOUNTER — Telehealth: Payer: Self-pay | Admitting: Internal Medicine

## 2019-08-14 NOTE — Telephone Encounter (Signed)
Please advise 

## 2019-08-14 NOTE — Telephone Encounter (Signed)
Pt states that the pharmacy filled rosuvastatin (CRESTOR) 10 MG tablet. Pt states that she was taken off of one medication and wants to know if this is the medication she needs to start taking or if the pharmacy made a mistake? Please advise.

## 2019-08-16 NOTE — Telephone Encounter (Signed)
For patient  She should be on crestor 10 mg daily if she cant tolerate this take it every other day  This is for cholesterol and also recommended with diabetes    Fax this message  For Dr. Candiss Norse (renal ) and cardiology ([place])  Fax this what BP meds and fluid pills is she on?  -still taking spironolactone and torsemide ?   Trying to clarify meds and if any changes made ?

## 2019-08-17 ENCOUNTER — Encounter (INDEPENDENT_AMBULATORY_CARE_PROVIDER_SITE_OTHER): Payer: Self-pay | Admitting: Nurse Practitioner

## 2019-08-17 ENCOUNTER — Ambulatory Visit (INDEPENDENT_AMBULATORY_CARE_PROVIDER_SITE_OTHER): Payer: Medicare Other

## 2019-08-17 ENCOUNTER — Other Ambulatory Visit: Payer: Self-pay

## 2019-08-17 ENCOUNTER — Ambulatory Visit (INDEPENDENT_AMBULATORY_CARE_PROVIDER_SITE_OTHER): Payer: Medicare Other | Admitting: Nurse Practitioner

## 2019-08-17 VITALS — BP 158/70 | HR 86 | Resp 16 | Wt 257.0 lb

## 2019-08-17 DIAGNOSIS — I6521 Occlusion and stenosis of right carotid artery: Secondary | ICD-10-CM

## 2019-08-17 DIAGNOSIS — G458 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: Secondary | ICD-10-CM

## 2019-08-17 DIAGNOSIS — I739 Peripheral vascular disease, unspecified: Secondary | ICD-10-CM | POA: Diagnosis not present

## 2019-08-17 NOTE — Telephone Encounter (Signed)
Faxed questions to Cardiology and Nephrology.   Spoke with patient and she states she was taken off of Crestor 02/2019 due to her kidneys and blood work. Patient states she will not take this medication until after her upcoming labs show what her levels are.

## 2019-08-17 NOTE — Telephone Encounter (Signed)
Inform pt  Crestor would not effect her kidneys  She needs to clarify with cardiology and renal/kidney which medications she should be taking and they want her on   [place]

## 2019-08-18 ENCOUNTER — Telehealth: Payer: Self-pay

## 2019-08-18 ENCOUNTER — Encounter (INDEPENDENT_AMBULATORY_CARE_PROVIDER_SITE_OTHER): Payer: Self-pay | Admitting: Nurse Practitioner

## 2019-08-18 NOTE — Progress Notes (Signed)
SUBJECTIVE:  Patient ID: Alexis Farmer, female    DOB: 10/15/1940, 79 y.o.   MRN: [id number] Chief Complaint  Patient presents with  . Follow-up    ultrasound follow up    HPI  Alexis Farmer is a 79 y.o. female The patient returns to the office for followup and review of the noninvasive studies. There have been no interval changes in lower extremity symptoms. No interval shortening of the patient's claudication distance or development of rest pain symptoms. No new ulcers or wounds have occurred since the last visit.  There have been no significant changes to the patient's overall health care.  The patient denies amaurosis fugax or recent TIA symptoms. There are no recent neurological changes noted. The patient denies history of DVT, PE or superficial thrombophlebitis. The patient denies recent episodes of angina or shortness of breath.   ABI Rt=[place] and Lt=[place]  (previous ABI's Rt=[place] and Lt= ) Duplex ultrasound of the bilateral tibial arteries reveals biphasic waveforms with good toe waveforms.  The patient is also seen for follow up evaluation of carotid stenosis. The carotid stenosis followed by ultrasound.  The patient also has known right subclavian steal syndrome.  The patient states that she does have some numbness and tingling in her right hand however it is not consistent.  She states that it comes and goes occasionally.  However, the patient does state that she does not have any cold hands or fingertips.  She denies any ulcerations or wounds of her fingertips.  The patient denies amaurosis fugax. There is no recent history of TIA symptoms or focal motor deficits. There is no prior documented CVA.  The patient is taking enteric-coated aspirin 81 mg daily.  There is no history of migraine headaches. There is no history of seizures.  There is a history of hyperlipidemia which is being treated with a statin.    Carotid Duplex done today shows 40 to 59% stenosis in the  right internal carotid artery and 1 to 39% stenosis in the left internal carotid artery.  No change compared to last study in 08/13/2018.  The left vertebral artery also demonstrates antegrade flow while the right upper vertebral artery demonstrates retrograde flow.  Normal flow hemodynamics were seen in the bilateral subclavian arteries.  Past Medical History:  Diagnosis Date  . Arthritis   . Chickenpox   . Diabetes mellitus without complication ([place])    one elevated reading/ no treatment  . Diverticulitis   . GI bleed   . High cholesterol   . History of blood transfusion   . Hypertension   . Renal insufficiency     Past Surgical History:  Procedure Laterality Date  . APPENDECTOMY    . CHOLECYSTECTOMY    . ECTOPIC PREGNANCY SURGERY    . EYE SURGERY     bilateral cataracts  . EYE SURGERY     02/11/2019 repair hole in right eye   . gallbladder     . HYSTEROSCOPY WITH D & C N/A 10/26/2016   Procedure: DILATATION AND CURETTAGE /HYSTEROSCOPY;  Surgeon: Benjaman Kindler, MD;  Location: [facility] ORS;  Service: Gynecology;  Laterality: N/A;  . HYSTEROSCOPY WITH D & C N/A 07/07/2018   Procedure: DILATATION AND CURETTAGE /HYSTEROSCOPY;  Surgeon: Benjaman Kindler, MD;  Location: [facility] ORS;  Service: Gynecology;  Laterality: N/A;  . THYROIDECTOMY, PARTIAL      Social History   Socioeconomic History  . Marital status: Widowed    Spouse name: Not on file  . Number of  children: Not on file  . Years of education: Not on file  . Highest education level: Not on file  Occupational History  . Not on file  Tobacco Use  . Smoking status: Never Smoker  . Smokeless tobacco: Never Used  Substance and Sexual Activity  . Alcohol use: No  . Drug use: No  . Sexual activity: Not on file  Other Topics Concern  . Not on file  Social History Narrative   Lives alone    From [place]   Widowed    Social Determinants of Health   Financial Resource Strain:   . Difficulty of Paying Living Expenses: Not on  file  Food Insecurity:   . Worried About Charity fundraiser in the Last Year: Not on file  . Ran Out of Food in the Last Year: Not on file  Transportation Needs:   . Lack of Transportation (Medical): Not on file  . Lack of Transportation (Non-Medical): Not on file  Physical Activity:   . Days of Exercise per Week: Not on file  . Minutes of Exercise per Session: Not on file  Stress:   . Feeling of Stress : Not on file  Social Connections:   . Frequency of Communication with Friends and Family: Not on file  . Frequency of Social Gatherings with Friends and Family: Not on file  . Attends Religious Services: Not on file  . Active Member of Clubs or Organizations: Not on file  . Attends Archivist Meetings: Not on file  . Marital Status: Not on file  Intimate Partner Violence:   . Fear of Current or Ex-Partner: Not on file  . Emotionally Abused: Not on file  . Physically Abused: Not on file  . Sexually Abused: Not on file    Family History  Problem Relation Age of Onset  . Diabetes Mother   . Hypertension Mother   . Stroke Mother   . Diabetes Other   . Healthy Father   . Diabetes Sister   . Heart disease Sister     Allergies  Allergen Reactions  . Penicillins Rash    Has patient had a PCN reaction causing immediate rash, facial/tongue/throat swelling, SOB or lightheadedness with hypotension: Yes Has patient had a PCN reaction causing severe rash involving mucus membranes or skin necrosis: No Has patient had a PCN reaction that required hospitalization No Has patient had a PCN reaction occurring within the last 10 years: Yes If all of the above answers are "NO", then may proceed with Cephalosporin use.      Review of Systems   Review of Systems: Negative Unless Checked Constitutional: _0 Weight loss  _1 Fever  _2 Chills Cardiac: _3 Chest pain   _4  Atrial Fibrillation  _5 Palpitations   _6 Shortness of breath when laying flat   _7 Shortness of breath with exertion.  _8 Shortness of breath at rest Vascular:  _9 Pain in legs with walking   _10 Pain in legs with standing _11 Pain in legs when laying flat   _12 Claudication    _13 Pain in feet when laying flat    _14 History of DVT   _15 Phlebitis   _16 Swelling in legs   _17 Varicose veins   _18 Non-healing ulcers Pulmonary:   _19 Uses home oxygen   _20 Productive cough   _21 Hemoptysis   _22 Wheeze  _23 COPD   _24 Asthma Neurologic:  _25 Dizziness   _26 Seizures  _27 Blackouts _28 History of stroke   _29 History of TIA  _30 Aphasia   _31 Temporary Blindness   _32 Weakness or numbness in arm   _33 Weakness or numbness in  leg Musculoskeletal:   _0 Joint swelling   _1 Joint pain   _2 Low back pain  _3  History of Knee Replacement _4 Arthritis _5 back Surgeries  _6  Spinal Stenosis    Hematologic:  _7 Easy bruising  _8 Easy bleeding   _9 Hypercoagulable state   _10 Anemic Gastrointestinal:  _11 Diarrhea   _12 Vomiting  _13 Gastroesophageal reflux/heartburn   _14 Difficulty swallowing. _15 Abdominal pain Genitourinary:  _16 Chronic kidney disease   _17 Difficult urination  _18 Anuric   _19 Blood in urine _20 Frequent urination  _21 Burning with urination   _22 Hematuria Skin:  _23 Rashes   _24 Ulcers _25 Wounds Psychological:  _26 History of anxiety   _27  History of major depression  _28  Memory Difficulties      OBJECTIVE:   Physical Exam  BP (!) 158/70 (BP Location: Right Arm)   Pulse 86   Resp 16   Wt 257 lb (116.6 kg)   BMI 45.53 kg/m   Gen: WD/WN, NAD Head: [place]/AT, No temporalis wasting.  Ear/Nose/Throat: Hearing grossly intact, nares w/o erythema or drainage Eyes: PER, EOMI, sclera nonicteric.  Neck: Supple, no masses.  No JVD.  Pulmonary:  Good air movement, no use of accessory muscles.  Cardiac: RRR Vascular:  Vessel Right Left  Radial Palpable Palpable  Dorsalis Pedis Palpable Palpable  Posterior Tibial Palpable Palpable   Gastrointestinal: soft, non-distended. No guarding/no peritoneal signs.  Musculoskeletal: Walker for ambulation. No deformity or atrophy.  Neurologic: Pain and  light touch intact in extremities.  Symmetrical.  Speech is fluent. Motor exam as listed above. Psychiatric: Judgment intact, Mood & affect appropriate for pt's clinical situation. Dermatologic: No Venous rashes. No Ulcers Noted.  No changes consistent with cellulitis. Lymph : No Cervical lymphadenopathy, no lichenification or skin changes of chronic lymphedema.       ASSESSMENT AND PLAN:  1. 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  She does not have any consistent symptoms.  Based on this we will continue to follow-up with this during her annual carotid visits.  However, discussed with the patient that if she has persistent numbness and tingling or cold feeling or pain of her hand and upper extremity she should contact her office as that may be an indication that proceed with intervention.  Patient understands.  2. Stenosis of right carotid artery Recommend:  Given the patient's asymptomatic subcritical stenosis no further invasive testing or surgery at this time.  Duplex ultrasound shows 40 to 59% stenosis in the right ICA with 1 to 39% in the left ICA   Continue antiplatelet therapy as prescribed Continue management of CAD, HTN and Hyperlipidemia Healthy heart diet,  encouraged exercise at least 4 times per week Follow up in 12 months with duplex ultrasound and physical exam   3. PAD (peripheral artery disease) (HCC)  Recommend:  The patient has evidence of atherosclerosis of the lower extremities with claudication.  The patient does not voice lifestyle limiting changes at this point in time.  Noninvasive studies do not suggest clinically significant change.  No invasive studies, angiography or surgery at this time The patient should continue walking and begin a more formal exercise program.  The patient should continue antiplatelet therapy and aggressive treatment of the lipid abnormalities  No changes in the  patient's medications at this time  The patient should continue wearing graduated compression socks 10-15 mmHg strength to control the mild edema.     Current Outpatient Medications on File Prior to Visit  Medication Sig Dispense Refill  . amLODipine (NORVASC) 5 MG tablet Take 1 tablet (  5 mg total) by mouth daily. 90 tablet 3  . Ascorbic Acid (VITAMIN C) 1000 MG tablet Take 500 mg by mouth daily.     . blood glucose meter kit and supplies KIT Accu chek, Dx code E11.65, check 3 times daily 1 each 0  . cloNIDine (CATAPRES) 0.2 MG tablet Take 1 tablet (0.2 mg total) by mouth 3 (three) times daily. 270 tablet 3  . ezetimibe (ZETIA) 10 MG tablet Take 1 tablet (10 mg total) by mouth daily. 90 tablet 3  . glucose 4 GM chewable tablet Chew 1 tablet (4 g total) by mouth as needed for low blood sugar. <70 50 tablet 12  . insulin NPH Human (NOVOLIN N) 100 UNIT/ML injection INJECT 20 UNITS IN THE MORNING AND 20 UNITS IN THE EVENING WITH A MEAL (Patient taking differently: INJECT 20 UNITS IN THE MORNING AND 20 UNITS IN THE EVENING WITH A MEAL (pt actually taking 20 qam and 15 units qpm)) 1200 mL 12  . insulin regular (NOVOLIN R) 100 units/mL injection 15 minutes before meals tid. 131-180 2 units 181 to 240 4 units 241-300 6units 301-350 8 units 351-400 10 units>400 12 units.  Keep glucose tablets for sugar < 70 43 mL 11  . Insulin Syringe-Needle U-100 (B-D INS SYR ULTRAFINE .5CC/30G) 30G X 1/2" 0.5 ML MISC 1 Device by Does not apply route daily. Up to 5x per day with insulin bid (NPH) and tid (Regular) 500 each 12  . meclizine (ANTIVERT) 12.5 MG tablet Take 1 tablet (12.5 mg total) by mouth daily as needed for dizziness. 30 tablet 0  . Multiple Vitamin (MULTI-VITAMIN) tablet Take by mouth.    . Multiple Vitamin (MULTIVITAMIN WITH MINERALS) TABS tablet Take 1 tablet by mouth daily.    Marland Kitchen spironolactone (ALDACTONE) 25 MG tablet Take 1 tablet (25 mg total) by mouth daily. 90 tablet 3  . torsemide (DEMADEX) 10 MG  tablet Take 10 mg by mouth daily as needed.    Marland Kitchen glucose blood test strip Use as instructed tid  Accuchek Guide (Patient not taking: Reported on 08/13/2019) 300 each 12  . mupirocin ointment (BACTROBAN) 2 % Apply 1 application topically 2 (two) times daily. Right index finger and left forearm (Patient not taking: Reported on 08/13/2019) 30 g 2  . nystatin cream (MYCOSTATIN) Apply pea size amount twice daily x 25 days, use with Triamcinolone    . ofloxacin (OCUFLOX) 0.3 % ophthalmic solution 1 DROP IN RIGHT EYE FOUR TIMES A DAY BEGIN ONE DAY AFTER SURGERY, CONTINUE AS DIRECTED.    Marland Kitchen olmesartan (BENICAR) 40 MG tablet Take 1 tablet (40 mg total) by mouth daily. (Patient not taking: Reported on 08/13/2019) 90 tablet 3  . PREDNISOLONE ACETATE P-F 1 % ophthalmic suspension Place 1 drop into the right eye 4 (four) times daily.    . rosuvastatin (CRESTOR) 10 MG tablet TAKE 1 TABLET (10 MG TOTAL) BY MOUTH DAILY. (Patient not taking: Reported on 08/17/2019) 90 tablet 3  . triamcinolone ointment (KENALOG) 0.1 % Apply pea size amount twice daily x 25 days, use with Nystatin     No current facility-administered medications on file prior to visit.    There are no Patient Instructions on file for this visit. No follow-ups on file.   Kris Hartmann, NP  This note was completed with Sales executive.  Any errors are purely unintentional.

## 2019-08-18 NOTE — Telephone Encounter (Signed)
Patient calling back in and was informed of the below. Patient state she will have to think about and will make the decision if she will re-start this medication.   Patient states she has seen her renal doctor and he reduced her Torsemide to 10 mg. She states she sees her cardiologist 08/25/19.

## 2019-08-18 NOTE — Telephone Encounter (Signed)
Left message to return call 

## 2019-08-18 NOTE — Telephone Encounter (Signed)
NO NOTE NEEDED

## 2019-08-18 NOTE — Telephone Encounter (Signed)
Pt called returning your phone call  

## 2019-08-18 NOTE — Telephone Encounter (Signed)
Alexis Corporal, RN  McLean-Scocuzza, Alexis Glow, MD; Thressa Sheller, [place]; Alexis Farmer  We last saw this patient on 08/21/2018 and on that visit she was taking Clonidine 0.2 mg twice a day with instructions that we may need to decrease down to 0.1 mg twice daily if her weight should drop. She was also taking Spironolactone 25 mg once daily and torsemide 20 mg twice daily. She is due for a follow up in our office and I will have scheduling reach out to get that done so we can see how she is doing on her current medication regimen. Hope this helps with the clarification.   Thanks,  Olin Hauser    Note from Cardiology, patient to be seen 08/25/19.

## 2019-08-21 ENCOUNTER — Telehealth: Payer: Self-pay | Admitting: Internal Medicine

## 2019-08-21 NOTE — Telephone Encounter (Signed)
Camren with [facility] [phone] called she is needing Verbal Orders for Social Work for transportation and for community resources Ok to leave voicemail with Verbal Orders

## 2019-08-21 NOTE — Telephone Encounter (Signed)
Verbal has been given. 

## 2019-08-24 ENCOUNTER — Telehealth: Payer: Self-pay | Admitting: Internal Medicine

## 2019-08-24 NOTE — Progress Notes (Signed)
Cardiology Office Note  Date:  08/25/2019   ID:  Alexis Farmer, DOB 1940-06-29, MRN [id number]  PCP:  McLean-Scocuzza, Nino Glow, MD   Chief Complaint  Patient presents with  . OTHER    12 month f/u no complaints today. Meds reviewed verbally with pt.    HPI:  [name] is a 79 y.o. woman with history of  obesity,  insulin-dependent diabetes, Hemoglobin A1c 9.5, poor diet, hypertension,  hyperlipidemia,  moderate LVH  hospital 08/13/2014  chest pain, malignant hypertension, possible sleep apnea,  chronic kidney disease,  Anxiety Notes indicate previous history of GI bleeding  colonoscopy which was normal by her report She presents to for follow-up of her hypertension, chronic diastolic CHF, poorly controlled diabetes, CKD  Weight down in one year Changed diet Decreased carbs  HBA1C 10 CR 1.2  Take trosemide 10 daily Guided by nephrology  Recent falls Uses a cane Chronic leg swelling L>R  EKG personally reviewed by myself on todays visit Shows NSR rate 71 bpm, LAD, ST and T wave ABN I and AVL  Other past medical hx reviewed 10/2016  D&C, hysteroscopy. Postmenopausal bleeding.   Previous Echocardiogram 08/13/2014 showed normal ejection fraction, normal RV size and function, moderate LVH  Renal artery duplex showing no blockage of the mid to distal vessels bilaterally, challenging study   PMH:   has a past medical history of Arthritis, Chickenpox, Diabetes mellitus without complication ([place]), Diverticulitis, GI bleed, High cholesterol, History of blood transfusion, Hypertension, and Renal insufficiency.  PSH:    Past Surgical History:  Procedure Laterality Date  . APPENDECTOMY    . CHOLECYSTECTOMY    . ECTOPIC PREGNANCY SURGERY    . EYE SURGERY     bilateral cataracts  . EYE SURGERY     02/11/2019 repair hole in right eye   . gallbladder     . HYSTEROSCOPY WITH D & C N/A 10/26/2016   Procedure: DILATATION AND CURETTAGE /HYSTEROSCOPY;  Surgeon:  Benjaman Kindler, MD;  Location: [facility] ORS;  Service: Gynecology;  Laterality: N/A;  . HYSTEROSCOPY WITH D & C N/A 07/07/2018   Procedure: DILATATION AND CURETTAGE /HYSTEROSCOPY;  Surgeon: Benjaman Kindler, MD;  Location: [facility] ORS;  Service: Gynecology;  Laterality: N/A;  . THYROIDECTOMY, PARTIAL      Current Outpatient Medications  Medication Sig Dispense Refill  . amLODipine (NORVASC) 5 MG tablet Take 1 tablet (5 mg total) by mouth daily. 90 tablet 3  . Ascorbic Acid (VITAMIN C PO) Take 500 mg by mouth daily.    . blood glucose meter kit and supplies KIT Accu chek, Dx code E11.65, check 3 times daily 1 each 0  . cloNIDine (CATAPRES) 0.2 MG tablet Take 1 tablet (0.2 mg total) by mouth 3 (three) times daily. 270 tablet 3  . ezetimibe (ZETIA) 10 MG tablet Take 1 tablet (10 mg total) by mouth daily. 90 tablet 3  . glucose 4 GM chewable tablet Chew 1 tablet (4 g total) by mouth as needed for low blood sugar. <70 50 tablet 12  . glucose blood test strip Use as instructed tid  Accuchek Guide 300 each 12  . insulin NPH Human (NOVOLIN N) 100 UNIT/ML injection INJECT 20 UNITS IN THE MORNING AND 20 UNITS IN THE EVENING WITH A MEAL (Patient taking differently: INJECT 20 UNITS IN THE MORNING AND 20 UNITS IN THE EVENING WITH A MEAL (pt actually taking 20 qam and 15 units qpm)) 1200 mL 12  . insulin regular (NOVOLIN R) 100 units/mL injection 15  minutes before meals tid. 131-180 2 units 181 to 240 4 units 241-300 6units 301-350 8 units 351-400 10 units>400 12 units.  Keep glucose tablets for sugar < 70 43 mL 11  . Insulin Syringe-Needle U-100 (B-D INS SYR ULTRAFINE .5CC/30G) 30G X 1/2" 0.5 ML MISC 1 Device by Does not apply route daily. Up to 5x per day with insulin bid (NPH) and tid (Regular) 500 each 12  . Multiple Vitamin (MULTIVITAMIN WITH MINERALS) TABS tablet Take 1 tablet by mouth daily.    . rosuvastatin (CRESTOR) 10 MG tablet TAKE 1 TABLET (10 MG TOTAL) BY MOUTH DAILY. 90 tablet 3  . spironolactone  (ALDACTONE) 25 MG tablet Take 1 tablet (25 mg total) by mouth daily. 90 tablet 3  . torsemide (DEMADEX) 10 MG tablet Take 10 mg by mouth daily as needed.     No current facility-administered medications for this visit.     Allergies:   Penicillins   Social History:  The patient  reports that she has never smoked. She has never used smokeless tobacco. She reports that she does not drink alcohol or use drugs.   Family History:   family history includes Diabetes in her mother, sister, and another family member; Healthy in her father; Heart disease in her sister; Hypertension in her mother; Stroke in her mother.    Review of Systems: Review of Systems  Constitutional: Negative.   Eyes: Negative.   Respiratory: Negative.   Cardiovascular: Positive for leg swelling.  Gastrointestinal: Negative.   Genitourinary: Negative.   Musculoskeletal: Negative.   Neurological: Negative.   Psychiatric/Behavioral: Negative.   All other systems reviewed and are negative.    PHYSICAL EXAM: VS:  BP 114/72 (BP Location: Right Arm, Patient Position: Sitting, Cuff Size: Large)   Pulse 71   Ht '5\' 3"'  (1.6 m)   Wt 257 lb 8 oz (116.8 kg)   SpO2 98%   BMI 45.61 kg/m  , BMI Body mass index is 45.61 kg/m.  Constitutional:  oriented to person, place, and time. No distress.  HENT:  Head: Grossly normal Eyes:  no discharge. No scleral icterus.  Neck: No JVD, no carotid bruits  Cardiovascular: Regular rate and rhythm, no murmurs appreciated Trace pitting LE edema Pulmonary/Chest: Clear to auscultation bilaterally, no wheezes or rails Abdominal: Soft.  no distension.  no tenderness.  Musculoskeletal: Normal range of motion Neurological:  normal muscle tone. Coordination normal. No atrophy Skin: Skin warm and dry Psychiatric: normal affect, pleasant   Recent Labs: 02/05/2019: B Natriuretic Peptide 16.3; Hemoglobin 13.4; Platelets 249 02/06/2019: ALT 24; TSH 2.02 02/19/2019: BUN 11; Creatinine 1.0;  Potassium 4.8; Sodium 138    Lipid Panel Lab Results  Component Value Date   CHOL 187 10/28/2018   HDL 42.00 10/28/2018   LDLCALC 66 01/20/2018   TRIG 327.0 (H) 10/28/2018      Wt Readings from Last 3 Encounters:  08/25/19 257 lb 8 oz (116.8 kg)  08/17/19 257 lb (116.6 kg)  08/13/19 253 lb 6.4 oz (114.9 kg)      ASSESSMENT AND PLAN:  Essential hypertension, benign Blood pressure is well controlled on today's visit. No changes made to the medications.  Mixed hyperlipidemia On crestor 10 Cholesterol is at goal on the current lipid regimen. No changes to the medications were made.  Uncontrolled type 2 diabetes mellitus with stage 3 chronic kidney disease, with long-term current use of insulin (HCC) Changed diet Labs pending  CKD (chronic kidney disease) stage 3, GFR 30-59 ml/min Plan:  Sees Dr. Candiss Norse Creatinine stable On torsemide 10 sdaily  Morbid obesity ([place]) Unable to exercise. Obesity hypoventilation   Chronic diastolic CHF (congestive heart failure) ([place]) Recommended she take extra torsemide as needed for shortness of breath, ankle swelling.  Stable Torsemide 10 daily today   Total encounter time more than 25 minutes  Greater than 50% was spent in counseling and coordination of care with the patient   Disposition:   F/U  12 months   Orders Placed This Encounter  Procedures  . EKG 12-Lead   I, Jesus Reyes am acting as a scribe for Ida Rogue, M.D., Ph.D.  I, Ida Rogue, M.D. Ph.D., have reviewed the above documentation for accuracy and completeness, and I agree with the above.    Signed, Esmond Plants, M.D., Ph.D. 08/25/2019  [place], [place]

## 2019-08-24 NOTE — Telephone Encounter (Signed)
Orders for Rollator with seat faxed to [facility].   Fax confirmation printed, fax went through.   Patient informed and verbalized understanding. Patient given the phone number to this location so that she may call later on to check on her order and pricing.

## 2019-08-25 ENCOUNTER — Other Ambulatory Visit: Payer: Self-pay

## 2019-08-25 ENCOUNTER — Ambulatory Visit (INDEPENDENT_AMBULATORY_CARE_PROVIDER_SITE_OTHER): Payer: Medicare Other | Admitting: Cardiovascular Disease

## 2019-08-25 ENCOUNTER — Encounter: Payer: Self-pay | Admitting: Cardiovascular Disease

## 2019-08-25 VITALS — BP 114/72 | HR 71 | Ht 63.0 in | Wt 257.5 lb

## 2019-08-25 DIAGNOSIS — I6521 Occlusion and stenosis of right carotid artery: Secondary | ICD-10-CM | POA: Diagnosis not present

## 2019-08-25 DIAGNOSIS — I739 Peripheral vascular disease, unspecified: Secondary | ICD-10-CM

## 2019-08-25 DIAGNOSIS — I10 Essential (primary) hypertension: Secondary | ICD-10-CM

## 2019-08-25 DIAGNOSIS — E782 Mixed hyperlipidemia: Secondary | ICD-10-CM | POA: Diagnosis not present

## 2019-08-25 DIAGNOSIS — G458 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: Secondary | ICD-10-CM

## 2019-08-25 DIAGNOSIS — I700 Atherosclerosis of aorta: Secondary | ICD-10-CM

## 2019-08-25 DIAGNOSIS — I5032 Chronic diastolic (congestive) heart failure: Secondary | ICD-10-CM

## 2019-08-25 NOTE — Patient Instructions (Signed)

## 2019-08-28 NOTE — Telephone Encounter (Signed)
Received request from Mckinley Jewel at [facility] for mor information.   Faxed order with NPI number, last note, and patient demographics including insurance information.   Patient informed of this being faxed in by [place].

## 2019-09-01 ENCOUNTER — Telehealth: Payer: Self-pay | Admitting: Internal Medicine

## 2019-09-01 NOTE — Telephone Encounter (Signed)
Pt called and would like for Dr. Olivia Mackie to write a prescription for meclizine again. She experiences vertigo from time to time and this usually helps. She said her insurance will only pay for a 90 day supply. She is requesting a call back when it is filled.

## 2019-09-02 ENCOUNTER — Other Ambulatory Visit: Payer: Self-pay | Admitting: Internal Medicine

## 2019-09-02 ENCOUNTER — Ambulatory Visit: Payer: Medicare Other | Attending: Internal Medicine

## 2019-09-02 DIAGNOSIS — R42 Dizziness and giddiness: Secondary | ICD-10-CM

## 2019-09-02 DIAGNOSIS — Z23 Encounter for immunization: Secondary | ICD-10-CM

## 2019-09-02 MED ORDER — MECLIZINE HCL 12.5 MG PO TABS: 12.5000 mg | ORAL_TABLET | Freq: Every day | ORAL | 2 refills | Status: DC | PRN

## 2019-09-02 NOTE — Telephone Encounter (Signed)
Pt called in asking about prescription. She said she really needs it.

## 2019-09-02 NOTE — Telephone Encounter (Signed)
Noted  TMS 

## 2019-09-02 NOTE — Progress Notes (Signed)
   Covid-19 Vaccination Clinic  Name:  Alexis Farmer    MRN: [id number] DOB: 1941/06/18  09/02/2019  Alexis Farmer was observed post Covid-19 immunization for 15 minutes without incident. She was provided with Vaccine Information Sheet and instruction to access the V-Safe system.   Alexis Farmer was instructed to call 911 with any severe reactions post vaccine: Marland Kitchen Difficulty breathing  . Swelling of face and throat  . A fast heartbeat  . A bad rash all over body  . Dizziness and weakness   Immunizations Administered    Name Date Dose VIS Date Route   Pfizer COVID-19 Vaccine 09/02/2019  4:36 PM 0.3 mL 05/29/2019 Intramuscular   Manufacturer: [place]   Lot: [id number]   [place]: [id number]

## 2019-09-02 NOTE — Telephone Encounter (Signed)
Called to speak with patient and she states she will not be having any imaging done on her head. She states that she has had this problem and managing it for almost 13 years, and nothing is worse or hurting her.   I asked the patient if she had ever had imaging done on her brain with anyone else. She states no. Explained to the patient that imaging allows Dr Olivia Mackie to evaluate and confirm what is going on to be sure she is getting the right medication and treatments. Informed her that Dr Olivia Mackie is requesting this be done so that she may continue prescribing the medication.   Patient verbalized understanding but states it is okay if Dr Olivia Mackie wants to stop filling this for her as she will not be having any imaging done on her head.   For your information

## 2019-09-02 NOTE — Telephone Encounter (Signed)
I have sent meclizine but again I rec CT or MRI of her brain for dizziness  Does she want me to order this?   [place]

## 2019-09-03 NOTE — Telephone Encounter (Signed)
Was informed by Resheedah that patient still has not heard from [employer].   Called and spoke with Myeisha at [facility] and informed her of everything being faxed in 3/12 to [facility]. Myeisha reached out to [facility] and was informed that the patient should be contacted today.   I informed Resheedah of this.

## 2019-09-03 NOTE — Telephone Encounter (Signed)
Patient informed and verbalized understanding.   [employer] has reached out to her and they have stores in [place] and [place] that have the walker she needs. States insurance will not cover but patient is willing to pay the $68 herself.   Informed patient to call back if she needs anything else.

## 2019-09-21 ENCOUNTER — Other Ambulatory Visit (INDEPENDENT_AMBULATORY_CARE_PROVIDER_SITE_OTHER): Payer: Medicare Other

## 2019-09-21 ENCOUNTER — Other Ambulatory Visit: Payer: Self-pay

## 2019-09-21 DIAGNOSIS — N1832 Chronic kidney disease, stage 3b: Secondary | ICD-10-CM

## 2019-09-21 DIAGNOSIS — Z794 Long term (current) use of insulin: Secondary | ICD-10-CM | POA: Diagnosis not present

## 2019-09-21 DIAGNOSIS — I10 Essential (primary) hypertension: Secondary | ICD-10-CM | POA: Diagnosis not present

## 2019-09-21 DIAGNOSIS — E785 Hyperlipidemia, unspecified: Secondary | ICD-10-CM | POA: Diagnosis not present

## 2019-09-21 DIAGNOSIS — E1121 Type 2 diabetes mellitus with diabetic nephropathy: Secondary | ICD-10-CM

## 2019-09-21 DIAGNOSIS — E041 Nontoxic single thyroid nodule: Secondary | ICD-10-CM | POA: Diagnosis not present

## 2019-09-21 DIAGNOSIS — E1165 Type 2 diabetes mellitus with hyperglycemia: Secondary | ICD-10-CM | POA: Diagnosis not present

## 2019-09-21 LAB — COMPREHENSIVE METABOLIC PANEL
ALT: 30 U/L (ref 0–35)
AST: 26 U/L (ref 0–37)
Albumin: 4.2 g/dL (ref 3.5–5.2)
Alkaline Phosphatase: 51 U/L (ref 39–117)
BUN: 15 mg/dL (ref 6–23)
CO2: 30 mEq/L (ref 19–32)
Calcium: 10.3 mg/dL (ref 8.4–10.5)
Chloride: 100 mEq/L (ref 96–112)
Creatinine, Ser: 1.11 mg/dL (ref 0.40–1.20)
GFR: 57.13 mL/min — ABNORMAL LOW (ref >60.00)
Glucose, Bld: 252 mg/dL — ABNORMAL HIGH (ref 70–99)
Potassium: 3.9 mEq/L (ref 3.5–5.1)
Sodium: 139 mEq/L (ref 135–145)
Total Bilirubin: 0.5 mg/dL (ref 0.2–1.2)
Total Protein: 7.4 g/dL (ref 6.0–8.3)

## 2019-09-21 LAB — CBC WITH DIFFERENTIAL/PLATELET
Basophils Absolute: 0.1 10*3/uL (ref 0.0–0.1)
Basophils Relative: 1.9 % (ref 0.0–3.0)
Eosinophils Absolute: 0.1 10*3/uL (ref 0.0–0.7)
Eosinophils Relative: 1.9 % (ref 0.0–5.0)
HCT: 41.8 % (ref 36.0–46.0)
Hemoglobin: 13.7 g/dL (ref 12.0–15.0)
Lymphocytes Relative: 48.3 % — ABNORMAL HIGH (ref 12.0–46.0)
Lymphs Abs: 3.7 10*3/uL (ref 0.7–4.0)
MCHC: 32.8 g/dL (ref 30.0–36.0)
MCV: 89.2 fl (ref 78.0–100.0)
Monocytes Absolute: 0.6 10*3/uL (ref 0.1–1.0)
Monocytes Relative: 8 % (ref 3.0–12.0)
Neutro Abs: 3.1 10*3/uL (ref 1.4–7.7)
Neutrophils Relative %: 39.9 % — ABNORMAL LOW (ref 43.0–77.0)
Platelets: 252 10*3/uL (ref 150.0–400.0)
RBC: 4.68 Mil/uL (ref 3.87–5.11)
RDW: 13.2 % (ref 11.5–15.5)
WBC: 7.7 10*3/uL (ref 4.0–10.5)

## 2019-09-21 LAB — LIPID PANEL
Cholesterol: 141 mg/dL (ref 0–200)
HDL: 56 mg/dL (ref >39.00)
LDL Cholesterol: 58 mg/dL (ref 0–99)
NonHDL: 84.93
Total CHOL/HDL Ratio: 3
Triglycerides: 137 mg/dL (ref 0.0–149.0)
VLDL: 27.4 mg/dL (ref 0.0–40.0)

## 2019-09-21 LAB — HEMOGLOBIN A1C: Hgb A1c MFr Bld: 9.8 % — ABNORMAL HIGH (ref 4.6–6.5)

## 2019-09-21 LAB — TSH: TSH: 2.16 u[IU]/mL (ref 0.35–4.50)

## 2019-09-24 ENCOUNTER — Telehealth: Payer: Self-pay | Admitting: Internal Medicine

## 2019-09-24 DIAGNOSIS — E785 Hyperlipidemia, unspecified: Secondary | ICD-10-CM

## 2019-09-24 MED ORDER — EZETIMIBE 10 MG PO TABS: 10.0000 mg | ORAL_TABLET | Freq: Every day | ORAL | 3 refills | Status: DC

## 2019-09-24 NOTE — Telephone Encounter (Signed)
Patient needs a refill on ezetimibe (ZETIA) 10 MG tablet

## 2019-10-13 ENCOUNTER — Ambulatory Visit: Payer: Medicare Other | Admitting: Podiatry

## 2019-10-19 ENCOUNTER — Ambulatory Visit (INDEPENDENT_AMBULATORY_CARE_PROVIDER_SITE_OTHER): Payer: Medicare Other | Admitting: Podiatry

## 2019-10-19 ENCOUNTER — Encounter: Payer: Self-pay | Admitting: Podiatry

## 2019-10-19 ENCOUNTER — Other Ambulatory Visit: Payer: Self-pay

## 2019-10-19 VITALS — Temp 97.8°F

## 2019-10-19 DIAGNOSIS — M79675 Pain in left toe(s): Secondary | ICD-10-CM

## 2019-10-19 DIAGNOSIS — M79674 Pain in right toe(s): Secondary | ICD-10-CM | POA: Diagnosis not present

## 2019-10-19 DIAGNOSIS — B351 Tinea unguium: Secondary | ICD-10-CM

## 2019-10-19 DIAGNOSIS — E1142 Type 2 diabetes mellitus with diabetic polyneuropathy: Secondary | ICD-10-CM

## 2019-10-19 NOTE — Patient Instructions (Signed)
Diabetes Mellitus and Foot Care Foot care is an important part of your health, especially when you have diabetes. Diabetes may cause you to have problems because of poor blood flow (circulation) to your feet and legs, which can cause your skin to:  Become thinner and drier.  Break more easily.  Heal more slowly.  Peel and crack. You may also have nerve damage (neuropathy) in your legs and feet, causing decreased feeling in them. This means that you may not notice minor injuries to your feet that could lead to more serious problems. Noticing and addressing any potential problems early is the best way to prevent future foot problems. How to care for your feet Foot hygiene  Wash your feet daily with warm water and mild soap. Do not use hot water. Then, pat your feet and the areas between your toes until they are completely dry. Do not soak your feet as this can dry your skin.  Trim your toenails straight across. Do not dig under them or around the cuticle. File the edges of your nails with an emery board or nail file.  Apply a moisturizing lotion or petroleum jelly to the skin on your feet and to dry, brittle toenails. Use lotion that does not contain alcohol and is unscented. Do not apply lotion between your toes. Shoes and socks  Wear clean socks or stockings every day. Make sure they are not too tight. Do not wear knee-high stockings since they may decrease blood flow to your legs.  Wear shoes that fit properly and have enough cushioning. Always look in your shoes before you put them on to be sure there are no objects inside.  To break in new shoes, wear them for just a few hours a day. This prevents injuries on your feet. Wounds, scrapes, corns, and calluses  Check your feet daily for blisters, cuts, bruises, sores, and redness. If you cannot see the bottom of your feet, use a mirror or ask someone for help.  Do not cut corns or calluses or try to remove them with medicine.  If you  find a minor scrape, cut, or break in the skin on your feet, keep it and the skin around it clean and dry. You may clean these areas with mild soap and water. Do not clean the area with peroxide, alcohol, or iodine.  If you have a wound, scrape, corn, or callus on your foot, look at it several times a day to make sure it is healing and not infected. Check for: ? Redness, swelling, or pain. ? Fluid or blood. ? Warmth. ? Pus or a bad smell. General instructions  Do not cross your legs. This may decrease blood flow to your feet.  Do not use heating pads or hot water bottles on your feet. They may burn your skin. If you have lost feeling in your feet or legs, you may not know this is happening until it is too late.  Protect your feet from hot and cold by wearing shoes, such as at the beach or on hot pavement.  Schedule a complete foot exam at least once a year (annually) or more often if you have foot problems. If you have foot problems, report any cuts, sores, or bruises to your health care provider immediately. Contact a health care provider if:  You have a medical condition that increases your risk of infection and you have any cuts, sores, or bruises on your feet.  You have an injury that is not   healing.  You have redness on your legs or feet.  You feel burning or tingling in your legs or feet.  You have pain or cramps in your legs and feet.  Your legs or feet are numb.  Your feet always feel cold.  You have pain around a toenail. Get help right away if:  You have a wound, scrape, corn, or callus on your foot and: ? You have pain, swelling, or redness that gets worse. ? You have fluid or blood coming from the wound, scrape, corn, or callus. ? Your wound, scrape, corn, or callus feels warm to the touch. ? You have pus or a bad smell coming from the wound, scrape, corn, or callus. ? You have a fever. ? You have a red line going up your leg. Summary  Check your feet every day  for cuts, sores, red spots, swelling, and blisters.  Moisturize feet and legs daily.  Wear shoes that fit properly and have enough cushioning.  If you have foot problems, report any cuts, sores, or bruises to your health care provider immediately.  Schedule a complete foot exam at least once a year (annually) or more often if you have foot problems. This information is not intended to replace advice given to you by your health care provider. Make sure you discuss any questions you have with your health care provider. Document Revised: 02/25/2019 Document Reviewed: 07/06/2016 Elsevier Patient Education  2020 [employer]  Peripheral Neuropathy Peripheral neuropathy is a type of nerve damage. It affects nerves that carry signals between the spinal cord and the arms, legs, and the rest of the body (peripheral nerves). It does not affect nerves in the spinal cord or brain. In peripheral neuropathy, one nerve or a group of nerves may be damaged. Peripheral neuropathy is a broad category that includes many specific nerve disorders, like diabetic neuropathy, hereditary neuropathy, and carpal tunnel syndrome. What are the causes? This condition may be caused by:  Diabetes. This is the most common cause of peripheral neuropathy.  Nerve injury.  Pressure or stress on a nerve that lasts a long time.  Lack (deficiency) of B vitamins. This can result from alcoholism, poor diet, or a restricted diet.  Infections.  Autoimmune diseases, such as rheumatoid arthritis and systemic lupus erythematosus.  Nerve diseases that are passed from parent to child (inherited).  Some medicines, such as cancer medicines (chemotherapy).  Poisonous (toxic) substances, such as lead and mercury.  Too little blood flowing to the legs.  Kidney disease.  Thyroid disease. In some cases, the cause of this condition is not known. What are the signs or symptoms? Symptoms of this condition depend on which of your  nerves is damaged. Common symptoms include:  Loss of feeling (numbness) in the feet, hands, or both.  Tingling in the feet, hands, or both.  Burning pain.  Very sensitive skin.  Weakness.  Not being able to move a part of the body (paralysis).  Muscle twitching.  Clumsiness or poor coordination.  Loss of balance.  Not being able to control your bladder.  Feeling dizzy.  Sexual problems. How is this diagnosed? Diagnosing and finding the cause of peripheral neuropathy can be difficult. Your health care provider will take your medical history and do a physical exam. A neurological exam will also be done. This involves checking things that are affected by your brain, spinal cord, and nerves (nervous system). For example, your health care provider will check your reflexes, how you move, and   what you can feel. You may have other tests, such as:  Blood tests.  Electromyogram (EMG) and nerve conduction tests. These tests check nerve function and how well the nerves are controlling the muscles.  Imaging tests, such as CT scans or MRI to rule out other causes of your symptoms.  Removing a small piece of nerve to be examined in a lab (nerve biopsy). This is rare.  Removing and examining a small amount of the fluid that surrounds the brain and spinal cord (lumbar puncture). This is rare. How is this treated? Treatment for this condition may involve:  Treating the underlying cause of the neuropathy, such as diabetes, kidney disease, or vitamin deficiencies.  Stopping medicines that can cause neuropathy, such as chemotherapy.  Medicine to relieve pain. Medicines may include: ? Prescription or over-the-counter pain medicine. ? Antiseizure medicine. ? Antidepressants. ? Pain-relieving patches that are applied to painful areas of skin.  Surgery to relieve pressure on a nerve or to destroy a nerve that is causing pain.  Physical therapy to help improve movement and  balance.  Devices to help you move around (assistive devices). Follow these instructions at home: Medicines  Take over-the-counter and prescription medicines only as told by your health care provider. Do not take any other medicines without first asking your health care provider.  Do not drive or use heavy machinery while taking prescription pain medicine. Lifestyle   Do not use any products that contain nicotine or tobacco, such as cigarettes and e-cigarettes. Smoking keeps blood from reaching damaged nerves. If you need help quitting, ask your health care provider.  Avoid or limit alcohol. Too much alcohol can cause a vitamin B deficiency, and vitamin B is needed for healthy nerves.  Eat a healthy diet. This includes: ? Eating foods that are high in fiber, such as fresh fruits and vegetables, whole grains, and beans. ? Limiting foods that are high in fat and processed sugars, such as fried or sweet foods. General instructions   If you have diabetes, work closely with your health care provider to keep your blood sugar under control.  If you have numbness in your feet: ? Check every day for signs of injury or infection. Watch for redness, warmth, and swelling. ? Wear padded socks and comfortable shoes. These help protect your feet.  Develop a good support system. Living with peripheral neuropathy can be stressful. Consider talking with a mental health specialist or joining a support group.  Use assistive devices and attend physical therapy as told by your health care provider. This may include using a walker or a cane.  Keep all follow-up visits as told by your health care provider. This is important. Contact a health care provider if:  You have new signs or symptoms of peripheral neuropathy.  You are struggling emotionally from dealing with peripheral neuropathy.  Your pain is not well-controlled. Get help right away if:  You have an injury or infection that is not healing  normally.  You develop new weakness in an arm or leg.  You fall frequently. Summary  Peripheral neuropathy is when the nerves in the arms, or legs are damaged, resulting in numbness, weakness, or pain.  There are many causes of peripheral neuropathy, including diabetes, pinched nerves, vitamin deficiencies, autoimmune disease, and hereditary conditions.  Diagnosing and finding the cause of peripheral neuropathy can be difficult. Your health care provider will take your medical history, do a physical exam, and do tests, including blood tests and nerve function tests.    Treatment involves treating the underlying cause of the neuropathy and taking medicines to help control pain. Physical therapy and assistive devices may also help. This information is not intended to replace advice given to you by your health care provider. Make sure you discuss any questions you have with your health care provider. Document Revised: 05/17/2017 Document Reviewed: 08/13/2016 Elsevier Patient Education  2020 Elsevier Inc.  

## 2019-10-19 NOTE — Progress Notes (Signed)
Subjective: Alexis Farmer presents today for follow up of at risk foot care. Pt has h/o NIDDM with chronic kidney disease and painful mycotic nails b/l that are difficult to trim. Pain interferes with ambulation. Aggravating factors include wearing enclosed shoe gear. Pain is relieved with periodic professional debridement.   She relates episode of the skin on the bottom of her feet peeling off in one layer. It was not painful and there was no redness nor drainage.  Allergies  Allergen Reactions  . Penicillins Rash    Has patient had a PCN reaction causing immediate rash, facial/tongue/throat swelling, SOB or lightheadedness with hypotension: Yes Has patient had a PCN reaction causing severe rash involving mucus membranes or skin necrosis: No Has patient had a PCN reaction that required hospitalization No Has patient had a PCN reaction occurring within the last 10 years: Yes If all of the above answers are "NO", then may proceed with Cephalosporin use.      Objective: Vitals:   10/19/19 1308  Temp: 97.8 F (36.6 C)    Pt is a pleasant 79 y.o. year old AA female, morbidly obese, in NAD. AAO x 3.   Vascular Examination:  Capillary refill time to digits immediate b/l. Palpable DP pulses b/l. Nonpalpable PT pulses b/l. Pedal hair absent b/l Skin temperature gradient within normal limits b/l.  Dermatological Examination: Pedal skin with normal turgor, texture and tone bilaterally. No open wounds bilaterally. No interdigital macerations bilaterally. Toenails 1-5 b/l elongated, dystrophic, thickened, crumbly with subungual debris and tenderness to dorsal palpation.  Musculoskeletal: Normal muscle strength 5/5 to all lower extremity muscle groups bilaterally. No pain crepitus or joint limitation noted with ROM b/l. Pes planus deformity noted b/l.  Utilizes walker for ambulation assistance.  Neurological: Protective sensation diminished with 10g monofilament b/l. Vibratory sensation  intact b/l. Proprioception intact bilaterally. Babinski reflex negative b/l. Clonus negative b/l.  Assessment: 1. Pain due to onychomycosis of toenails of both feet   2. Diabetic peripheral neuropathy associated with type 2 diabetes mellitus ([place])     Plan: -No new findings. No new orders. -Continue diabetic foot care principles. Literature dispensed on today.  -Toenails 1-5 b/l were debrided in length and girth with sterile nail nippers and dremel without iatrogenic bleeding.  -Patient to continue soft, supportive shoe gear daily. -Patient to report any pedal injuries to medical professional immediately. -Patient/POA to call should there be question/concern in the interim.  Return in about 10 weeks (around 12/28/2019) for diabetic nail trim.  [name], DPM

## 2019-10-24 ENCOUNTER — Emergency Department (HOSPITAL_COMMUNITY)
Admission: EM | Admit: 2019-10-24 | Discharge: 2019-10-24 | Disposition: A | Discharge status: Home / Self Care | Payer: Medicare Other | Attending: Emergency Medicine | Admitting: Emergency Medicine

## 2019-10-24 ENCOUNTER — Emergency Department (HOSPITAL_COMMUNITY): Payer: Medicare Other

## 2019-10-24 ENCOUNTER — Other Ambulatory Visit: Payer: Self-pay

## 2019-10-24 ENCOUNTER — Encounter (HOSPITAL_COMMUNITY): Payer: Self-pay | Admitting: Emergency Medicine

## 2019-10-24 DIAGNOSIS — Z79899 Other long term (current) drug therapy: Secondary | ICD-10-CM | POA: Insufficient documentation

## 2019-10-24 DIAGNOSIS — I130 Hypertensive heart and chronic kidney disease with heart failure and stage 1 through stage 4 chronic kidney disease, or unspecified chronic kidney disease: Secondary | ICD-10-CM | POA: Insufficient documentation

## 2019-10-24 DIAGNOSIS — Z794 Long term (current) use of insulin: Secondary | ICD-10-CM | POA: Insufficient documentation

## 2019-10-24 DIAGNOSIS — E1140 Type 2 diabetes mellitus with diabetic neuropathy, unspecified: Secondary | ICD-10-CM | POA: Diagnosis not present

## 2019-10-24 DIAGNOSIS — K5900 Constipation, unspecified: Secondary | ICD-10-CM

## 2019-10-24 DIAGNOSIS — N183 Chronic kidney disease, stage 3 unspecified: Secondary | ICD-10-CM | POA: Diagnosis not present

## 2019-10-24 DIAGNOSIS — I5032 Chronic diastolic (congestive) heart failure: Secondary | ICD-10-CM | POA: Insufficient documentation

## 2019-10-24 DIAGNOSIS — E1122 Type 2 diabetes mellitus with diabetic chronic kidney disease: Secondary | ICD-10-CM | POA: Diagnosis not present

## 2019-10-24 LAB — COMPREHENSIVE METABOLIC PANEL
ALT: 34 U/L (ref 0–44)
AST: 37 U/L (ref 15–41)
Albumin: 3.9 g/dL (ref 3.5–5.0)
Alkaline Phosphatase: 43 U/L (ref 38–126)
Anion gap: 12 (ref 5–15)
BUN: 9 mg/dL (ref 8–23)
CO2: 25 mmol/L (ref 22–32)
Calcium: 9.7 mg/dL (ref 8.9–10.3)
Chloride: 99 mmol/L (ref 98–111)
Creatinine, Ser: 1.08 mg/dL — ABNORMAL HIGH (ref 0.44–1.00)
GFR calc Af Amer: 56 mL/min — ABNORMAL LOW (ref >60)
GFR calc non Af Amer: 48 mL/min — ABNORMAL LOW (ref >60)
Glucose, Bld: 326 mg/dL — ABNORMAL HIGH (ref 70–99)
Potassium: 3.8 mmol/L (ref 3.5–5.1)
Sodium: 136 mmol/L (ref 135–145)
Total Bilirubin: 1.4 mg/dL — ABNORMAL HIGH (ref 0.3–1.2)
Total Protein: 7.2 g/dL (ref 6.5–8.1)

## 2019-10-24 LAB — URINALYSIS, ROUTINE W REFLEX MICROSCOPIC
Bacteria, UA: NONE SEEN
Bilirubin Urine: NEGATIVE
Glucose, UA: >=500 mg/dL — AB
Ketones, ur: NEGATIVE mg/dL
Leukocytes,Ua: NEGATIVE
Nitrite: NEGATIVE
Protein, ur: 100 mg/dL — AB
Specific Gravity, Urine: 1.008 (ref 1.005–1.030)
pH: 6 (ref 5.0–8.0)

## 2019-10-24 LAB — CBC
HCT: 45.4 % (ref 36.0–46.0)
Hemoglobin: 14.3 g/dL (ref 12.0–15.0)
MCH: 28.4 pg (ref 26.0–34.0)
MCHC: 31.5 g/dL (ref 30.0–36.0)
MCV: 90.3 fL (ref 80.0–100.0)
Platelets: 254 10*3/uL (ref 150–400)
RBC: 5.03 MIL/uL (ref 3.87–5.11)
RDW: 13.4 % (ref 11.5–15.5)
WBC: 9.5 10*3/uL (ref 4.0–10.5)
nRBC: 0 % (ref 0.0–0.2)

## 2019-10-24 LAB — LIPASE, BLOOD: Lipase: 180 U/L — ABNORMAL HIGH (ref 11–51)

## 2019-10-24 LAB — CBG MONITORING, ED: Glucose-Capillary: 290 mg/dL — ABNORMAL HIGH (ref 70–99)

## 2019-10-24 MED ORDER — SODIUM CHLORIDE 0.9% FLUSH: 3.0000 mL | Freq: Once | INTRAVENOUS | Status: DC

## 2019-10-24 MED ORDER — IOHEXOL 300 MG/ML  SOLN
100.0000 mL | Freq: Once | INTRAMUSCULAR | Status: AC | PRN
  Administered 2019-10-24: 100 mL via INTRAVENOUS

## 2019-10-24 NOTE — ED Provider Notes (Signed)
I saw and evaluated the patient, reviewed the resident's note and I agree with the findings and plan.  Pertinent History: constipation and rectal fullness X 3 days - failed at attempt at enema at home, no stool on exam per resident report - no gross blood.   Soft NT abd    I was personally present and directly supervised the following procedures:  Medical evaluation  I personally interpreted the EKG as well as the resident and agree with the interpretation on the resident's chart.  Final diagnoses:  None      Noemi Chapel, MD 10/25/19 1718

## 2019-10-24 NOTE — ED Triage Notes (Signed)
C/o generalized abd pain and rectal pain with constipation x 3 days.

## 2019-10-24 NOTE — ED Provider Notes (Signed)
[facility] EMERGENCY DEPARTMENT Provider Note   CSN: [id number] Arrival date & time: 10/24/19  1509     History Chief Complaint  Patient presents with  . Constipation    Alexis Farmer is a 79 y.o. female.  HPI Patient presents for 3 days of constipation.  She reports a sensation of rectal fullness.  She has been trying to go feels like there is some sort of obstruction.  She try to give herself a Fleet enema at home but was unable to insert the catheter.  At that time, she had a little bit of bleeding as result of the effort.  She denies any history of constipation prior to 3 days ago.  She has been urinating without difficulty.  She has no urinary symptoms.  There is some pain in the rectal area.  She denies any abdominal pain.  She has not vomited.  She has been able to eat.  She does report intermittent nausea that she attributes to anxiety.  She denies any recent fevers or chills.    Past Medical History:  Diagnosis Date  . Arthritis   . Chickenpox   . Diabetes mellitus without complication ([place])    one elevated reading/ no treatment  . Diverticulitis   . GI bleed   . High cholesterol   . History of blood transfusion   . Hypertension   . Renal insufficiency     Patient Active Problem List   Diagnosis Date Noted  . Falls frequently 08/13/2019  . Physical deconditioning 08/13/2019  . Abdominal aortic atherosclerosis ([place]) 07/24/2019  . Morbid obesity with BMI of 45.0-49.9, adult ([place]) 06/09/2019  . Hypercalcemia 03/18/2019  . Retinal hole of right eye 02/06/2019  . 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 Disorder of right rotator cuff 06/24/2018  . Abnormal gait 03/26/2018  . Fall 03/26/2018  . Arthritis 03/26/2018  . Thyroid nodule 01/20/2018  . Dysuria 03/22/2017  . Cervical spondylosis with radiculopathy 01/26/2017  . Rectal bleed 07/29/2016  . Carotid stenosis 07/19/2016  . PAD (peripheral artery disease) ([place])  07/19/2016  . Pain and swelling of lower leg 07/19/2016  . Bradycardia 07/17/2016  . Postmenopausal bleeding 07/17/2016  . Chronic diastolic CHF (congestive heart failure) ([place]) 05/31/2016  . Cough 01/25/2016  . Chronic fatigue 11/15/2015  . Depression 10/13/2015  . Osteoarthritis 10/13/2015  . Vertigo 10/13/2015  . Anxiety 09/13/2015  . GI bleed 08/03/2015  . Type 2 diabetes mellitus with stage 3 chronic kidney disease, with long-term current use of insulin ([place]) 03/11/2015  . Mixed hyperlipidemia 08/26/2014  . Essential hypertension 04/08/2014  . Diabetes mellitus type II, uncontrolled ([place]) 04/08/2014  . CKD (chronic kidney disease) stage 3, GFR 30-59 ml/min ([facility]) 04/08/2014  . Pedal edema 04/08/2014  . Morbid obesity ([place]) 04/08/2014  . Lower back pain 04/08/2014  . Diabetic polyneuropathy ([place]) 04/08/2014    Past Surgical History:  Procedure Laterality Date  . APPENDECTOMY    . CHOLECYSTECTOMY    . ECTOPIC PREGNANCY SURGERY    . EYE SURGERY     bilateral cataracts  . EYE SURGERY     02/11/2019 repair hole in right eye   . gallbladder     . HYSTEROSCOPY WITH D & C N/A 10/26/2016   Procedure: DILATATION AND CURETTAGE /HYSTEROSCOPY;  Surgeon: Benjaman Kindler, MD;  Location: [facility] ORS;  Service: Gynecology;  Laterality: N/A;  . HYSTEROSCOPY WITH D & C N/A 07/07/2018   Procedure: DILATATION AND CURETTAGE /HYSTEROSCOPY;  Surgeon:  Benjaman Kindler, MD;  Location: [facility] ORS;  Service: Gynecology;  Laterality: N/A;  . THYROIDECTOMY, PARTIAL       OB History   No obstetric history on file.     Family History  Problem Relation Age of Onset  . Diabetes Mother   . Hypertension Mother   . Stroke Mother   . Diabetes Other   . Healthy Father   . Diabetes Sister   . Heart disease Sister     Social History   Tobacco Use  . Smoking status: Never Smoker  . Smokeless tobacco: Never Used  Substance Use Topics  . Alcohol use: No  . Drug use: No    Home Medications Prior  to Admission medications   Medication Sig Start Date End Date Taking? Authorizing Provider  amLODipine (NORVASC) 5 MG tablet Take 1 tablet (5 mg total) by mouth daily. 03/12/19   McLean-Scocuzza, Nino Glow, MD  Ascorbic Acid (VITAMIN C PO) Take 500 mg by mouth daily.    [provider]  blood glucose meter kit and supplies KIT Accu chek, Dx code E11.65, check 3 times daily 07/26/17   Leone Haven, MD  cloNIDine (CATAPRES) 0.2 MG tablet Take 1 tablet (0.2 mg total) by mouth 3 (three) times daily. 08/13/19   McLean-Scocuzza, Nino Glow, MD  ezetimibe (ZETIA) 10 MG tablet Take 1 tablet (10 mg total) by mouth daily. 09/24/19   McLean-Scocuzza, Nino Glow, MD  glucose 4 GM chewable tablet Chew 1 tablet (4 g total) by mouth as needed for low blood sugar. <70 08/13/19   McLean-Scocuzza, Nino Glow, MD  glucose blood test strip Use as instructed tid  Accuchek Guide 03/18/19   McLean-Scocuzza, Nino Glow, MD  insulin NPH Human (NOVOLIN N) 100 UNIT/ML injection INJECT 20 UNITS IN THE MORNING AND 20 UNITS IN THE EVENING WITH A MEAL Patient taking differently: INJECT 20 UNITS IN THE MORNING AND 20 UNITS IN THE EVENING WITH A MEAL (pt actually taking 20 qam and 15 units qpm) 12/22/18   McLean-Scocuzza, Nino Glow, MD  insulin regular (NOVOLIN R) 100 units/mL injection 15 minutes before meals tid. 131-180 2 units 181 to 240 4 units 241-300 6units 301-350 8 units 351-400 10 units>400 12 units.  Keep glucose tablets for sugar < 70 08/13/19   McLean-Scocuzza, Nino Glow, MD  Insulin Syringe-Needle U-100 (B-D INS SYR ULTRAFINE .5CC/30G) 30G X 1/2" 0.5 ML MISC 1 Device by Does not apply route daily. Up to 5x per day with insulin bid (NPH) and tid (Regular) 08/13/19   McLean-Scocuzza, Nino Glow, MD  meclizine (ANTIVERT) 12.5 MG tablet Take 1 tablet (12.5 mg total) by mouth daily as needed for dizziness. 09/02/19   McLean-Scocuzza, Nino Glow, MD  Multiple Vitamin (MULTIVITAMIN WITH MINERALS) TABS tablet Take 1 tablet by mouth daily.     [provider]  progesterone (PROMETRIUM) 200 MG capsule Take by mouth. 08/11/19   [provider]  rosuvastatin (CRESTOR) 10 MG tablet TAKE 1 TABLET (10 MG TOTAL) BY MOUTH DAILY. 08/13/19   McLean-Scocuzza, Nino Glow, MD  spironolactone (ALDACTONE) 25 MG tablet Take 1 tablet (25 mg total) by mouth daily. 11/13/18   McLean-Scocuzza, Nino Glow, MD  torsemide (DEMADEX) 10 MG tablet Take 10 mg by mouth daily as needed. 06/22/19   [provider]    Allergies    Penicillins  Review of Systems   Review of Systems  Constitutional: Negative for activity change, appetite change, chills, fatigue and fever.  HENT: Negative for ear  pain and sore throat.   Eyes: Negative for pain and visual disturbance.  Respiratory: Negative for cough and shortness of breath.   Cardiovascular: Negative for chest pain and palpitations.  Gastrointestinal: Positive for constipation, nausea and rectal pain. Negative for abdominal distention, abdominal pain and vomiting.  Genitourinary: Negative for dysuria, frequency, hematuria, pelvic pain and urgency.  Musculoskeletal: Negative for arthralgias, back pain, myalgias and neck pain.  Skin: Negative for color change and rash.  Neurological: Negative for dizziness, seizures, syncope, weakness, light-headedness and numbness.  Hematological: Does not bruise/bleed easily.  All other systems reviewed and are negative.   Physical Exam Updated Vital Signs BP 112/79 (BP Location: Right Arm)   Pulse 94   Temp 98.8 F (37.1 C) (Oral)   Resp 18   SpO2 98%   Physical Exam Vitals and nursing note reviewed.  Constitutional:      General: She is not in acute distress.    Appearance: Normal appearance. She is well-developed. She is obese. She is not ill-appearing, toxic-appearing or diaphoretic.  HENT:     Head: Normocephalic and atraumatic.     Right Ear: External ear normal.     Left Ear: External ear normal.     Nose: Nose normal.     Mouth/Throat:       Mouth: Mucous membranes are moist.     Pharynx: Oropharynx is clear.  Eyes:     General: No scleral icterus.    Conjunctiva/sclera: Conjunctivae normal.  Cardiovascular:     Rate and Rhythm: Normal rate and regular rhythm.     Heart sounds: No murmur.  Pulmonary:     Effort: Pulmonary effort is normal. No respiratory distress.     Breath sounds: Normal breath sounds. No wheezing or rales.  Abdominal:     General: There is no distension.     Palpations: Abdomen is soft. There is no mass.     Tenderness: There is no abdominal tenderness. There is no right CVA tenderness, left CVA tenderness or guarding.  Genitourinary:    Rectum: Normal. No mass, anal fissure, external hemorrhoid or internal hemorrhoid. Normal anal tone.  Musculoskeletal:        General: No swelling, tenderness or deformity.     Cervical back: Normal range of motion and neck supple. No rigidity.  Skin:    General: Skin is warm and dry.     Capillary Refill: Capillary refill takes less than 2 seconds.     Coloration: Skin is not jaundiced or pale.  Neurological:     General: No focal deficit present.     Mental Status: She is alert and oriented to person, place, and time.     Cranial Nerves: No cranial nerve deficit.     Sensory: No sensory deficit.     Motor: No weakness.  Psychiatric:        Mood and Affect: Mood normal.        Behavior: Behavior normal.        Thought Content: Thought content normal.        Judgment: Judgment normal.     ED Results / Procedures / Treatments   Labs (all labs ordered are listed, but only abnormal results are displayed) Labs Reviewed  LIPASE, BLOOD - Abnormal; Notable for the following components:      Result Value   Lipase 180 (*)    All other components within normal limits  COMPREHENSIVE METABOLIC PANEL - Abnormal; Notable for the following components:   Glucose, Bld  326 (*)    Creatinine, Ser 1.08 (*)    Total Bilirubin 1.4 (*)    GFR calc non Af Amer 48 (*)     GFR calc Af Amer 56 (*)    All other components within normal limits  URINALYSIS, ROUTINE W REFLEX MICROSCOPIC - Abnormal; Notable for the following components:   Glucose, UA >=500 (*)    Hgb urine dipstick SMALL (*)    Protein, ur 100 (*)    All other components within normal limits  CBG MONITORING, ED - Abnormal; Notable for the following components:   Glucose-Capillary 290 (*)    All other components within normal limits  CBC    EKG None  Radiology CT ABDOMEN PELVIS W CONTRAST  Result Date: 10/24/2019 CLINICAL DATA:  Generalized abdominal pain and rectal pain with constipation x3 days. EXAM: CT ABDOMEN AND PELVIS WITH CONTRAST TECHNIQUE: Multidetector CT imaging of the abdomen and pelvis was performed using the standard protocol following bolus administration of intravenous contrast. CONTRAST:  133m OMNIPAQUE IOHEXOL 300 MG/ML  SOLN COMPARISON:  February 06, 2019 FINDINGS: Lower chest: No acute abnormality. Hepatobiliary: No focal liver abnormality is seen. Status post cholecystectomy. No biliary dilatation. Pancreas: Unremarkable. No pancreatic ductal dilatation or surrounding inflammatory changes. Spleen: Normal in size without focal abnormality. Adrenals/Urinary Tract: Adrenal glands are unremarkable. Kidneys are normal, without renal calculi, focal lesion, or hydronephrosis. Bladder is unremarkable. Stomach/Bowel: Stomach is within normal limits. The appendix is not clearly identified. No evidence of bowel wall thickening, distention, or inflammatory changes. Noninflamed diverticula are seen within the proximal sigmoid colon. A moderate to marked amount of stool is seen throughout the colon. Vascular/Lymphatic: There is marked severity calcification of the abdominal aorta and bilateral common iliac arteries without evidence of aneurysmal dilatation or dissection. No enlarged abdominal or pelvic lymph nodes. Reproductive: Uterus and bilateral adnexa are unremarkable. Other: No abdominal  wall hernia or abnormality. No abdominopelvic ascites. Musculoskeletal: Degenerative changes are seen throughout the lumbar spine. IMPRESSION: 1. Moderate to marked amount of stool throughout the colon, without evidence of bowel obstruction. 2. Sigmoid diverticulosis. 3. Evidence of prior cholecystectomy. Aortic Atherosclerosis (ICD10-I70.0). Electronically Signed   By: TVirgina NorfolkM.D.   On: 10/24/2019 18:23    Procedures Procedures (including critical care time)  Medications Ordered in ED Medications  iohexol (OMNIPAQUE) 300 MG/ML solution 100 mL (100 mLs Intravenous Contrast Given 10/24/19 1758)    ED Course  I have reviewed the triage vital signs and the nursing notes.  Pertinent labs & imaging results that were available during my care of the patient were reviewed by me and considered in my medical decision making (see chart for details).    MDM Rules/Calculators/A&P                      The patient is a 79year old female who presents for 3 days of constipation.  She reports the sensation of rectal fullness and feels there may be something obstructing her anus.  She is attempted to give herself an enema at home, unsuccessfully.  She denies any abdominal pain, dysuria, fevers, chills, vomiting.  She has intermittent nausea, which she says is baseline for her.  On exam, patient is well-appearing.  Abdomen is soft and nontender.  There are no external findings on rectal exam.  Upon DRE, no masses are appreciated.  No stool ball was able to be palpated in rectal vault.  No gross blood was identified on DRE.  Given the patient's  age and sensation of rectal obstruction, CT scan of abdomen and pelvis was ordered.  Urinalysis showed no evidence of acute infection.  Labs notable for elevated glucose (290) and elevated lipase (180).  Given the absence of epigastric pain or tenderness acute myositis is not suspected.  Bilirubin was mildly elevated at 1.4.  Hepatobiliary enzymes are otherwise  normal.  CBC was notable for normal hemoglobin and absence of leukocytosis.  CT scan showed stool burden throughout the colon, without any evidence of bowel obstruction.  There were no other acute findings.  Upon reassessment, patient reported that she had a large volume bowel movement in the ED.  After this, she experienced resolution of her rectal fullness and related anxiety.  She was informed of CT scan results that were only notable for large amount of stool (prior to her bowel movement).  She expressed relief and the desire to go home.  Patient was discharged home in good condition.  Final Clinical Impression(s) / ED Diagnoses Final diagnoses:  Constipation, unspecified constipation type    Rx / DC Orders ED Discharge Orders    None       Godfrey Pick, MD 10/25/19 0563    Noemi Chapel, MD 10/25/19 1718

## 2019-10-29 ENCOUNTER — Ambulatory Visit: Payer: Self-pay | Admitting: Pharmacist

## 2019-10-29 DIAGNOSIS — I10 Essential (primary) hypertension: Secondary | ICD-10-CM

## 2019-10-29 DIAGNOSIS — R296 Repeated falls: Secondary | ICD-10-CM

## 2019-10-29 DIAGNOSIS — R5381 Other malaise: Secondary | ICD-10-CM

## 2019-10-29 NOTE — Chronic Care Management (AMB) (Signed)
  Chronic Care Management   Note  10/29/2019 Name: Alexis Farmer MRN: [id number] DOB: 1940-11-30  [name] is a 79 y.o. year old female who is a primary care patient of McLean-Scocuzza, Nino Glow, MD. The CCM team was consulted for assistance with chronic disease management and care coordination needs.    In speaking with clinic staff, noted that patient's granddaughter is having a difficult time finding a safe place for patient to live that she can afford. Placed Care Guide referral for support.   Catie Darnelle Maffucci, PharmD, [place], CPP Clinical Pharmacist [place] [phone]

## 2019-11-06 ENCOUNTER — Other Ambulatory Visit: Payer: Self-pay

## 2019-11-10 ENCOUNTER — Ambulatory Visit (INDEPENDENT_AMBULATORY_CARE_PROVIDER_SITE_OTHER): Payer: Medicare Other | Admitting: Internal Medicine

## 2019-11-10 ENCOUNTER — Encounter: Payer: Self-pay | Admitting: Internal Medicine

## 2019-11-10 ENCOUNTER — Other Ambulatory Visit: Payer: Self-pay

## 2019-11-10 VITALS — BP 150/78 | HR 74 | Temp 97.0°F | Ht 63.0 in | Wt 258.8 lb

## 2019-11-10 DIAGNOSIS — K5900 Constipation, unspecified: Secondary | ICD-10-CM

## 2019-11-10 DIAGNOSIS — E162 Hypoglycemia, unspecified: Secondary | ICD-10-CM

## 2019-11-10 DIAGNOSIS — Z1231 Encounter for screening mammogram for malignant neoplasm of breast: Secondary | ICD-10-CM

## 2019-11-10 DIAGNOSIS — Z794 Long term (current) use of insulin: Secondary | ICD-10-CM

## 2019-11-10 DIAGNOSIS — N183 Chronic kidney disease, stage 3 unspecified: Secondary | ICD-10-CM

## 2019-11-10 DIAGNOSIS — E1121 Type 2 diabetes mellitus with diabetic nephropathy: Secondary | ICD-10-CM

## 2019-11-10 DIAGNOSIS — N1831 Chronic kidney disease, stage 3a: Secondary | ICD-10-CM

## 2019-11-10 DIAGNOSIS — E1122 Type 2 diabetes mellitus with diabetic chronic kidney disease: Secondary | ICD-10-CM | POA: Diagnosis not present

## 2019-11-10 DIAGNOSIS — E1165 Type 2 diabetes mellitus with hyperglycemia: Secondary | ICD-10-CM | POA: Diagnosis not present

## 2019-11-10 DIAGNOSIS — R269 Unspecified abnormalities of gait and mobility: Secondary | ICD-10-CM

## 2019-11-10 DIAGNOSIS — IMO0002 Reserved for concepts with insufficient information to code with codable children: Secondary | ICD-10-CM

## 2019-11-10 DIAGNOSIS — N1832 Chronic kidney disease, stage 3b: Secondary | ICD-10-CM

## 2019-11-10 MED ORDER — POLYETHYLENE GLYCOL 3350 17 GM/SCOOP PO POWD: 17.0000 g | Freq: Every day | ORAL | 11 refills | Status: DC | PRN

## 2019-11-10 MED ORDER — INSULIN REGULAR HUMAN 100 UNIT/ML IJ SOLN: INTRAMUSCULAR | 11 refills | Status: DC

## 2019-11-10 MED ORDER — GLUCOSE 4 G PO CHEW: 1.0000 | CHEWABLE_TABLET | ORAL | 12 refills | Status: DC | PRN

## 2019-11-10 NOTE — Patient Instructions (Addendum)
Consider Colace as needed as well over the counter    Diabetes Mellitus and Nutrition, Adult When you have diabetes (diabetes mellitus), it is very important to have healthy eating habits because your blood sugar (glucose) levels are greatly affected by what you eat and drink. Eating healthy foods in the appropriate amounts, at about the same times every day, can help you:  Control your blood glucose.  Lower your risk of heart disease.  Improve your blood pressure.  Reach or maintain a healthy weight. Every person with diabetes is different, and each person has different needs for a meal plan. Your health care provider may recommend that you work with a diet and nutrition specialist (dietitian) to make a meal plan that is best for you. Your meal plan may vary depending on factors such as:  The calories you need.  The medicines you take.  Your weight.  Your blood glucose, blood pressure, and cholesterol levels.  Your activity level.  Other health conditions you have, such as heart or kidney disease. How do carbohydrates affect me? Carbohydrates, also called carbs, affect your blood glucose level more than any other type of food. Eating carbs naturally raises the amount of glucose in your blood. Carb counting is a method for keeping track of how many carbs you eat. Counting carbs is important to keep your blood glucose at a healthy level, especially if you use insulin or take certain oral diabetes medicines. It is important to know how many carbs you can safely have in each meal. This is different for every person. Your dietitian can help you calculate how many carbs you should have at each meal and for each snack. Foods that contain carbs include:  Bread, cereal, rice, pasta, and crackers.  Potatoes and corn.  Peas, beans, and lentils.  Milk and yogurt.  Fruit and juice.  Desserts, such as cakes, cookies, ice cream, and candy. How does alcohol affect me? Alcohol can cause a  sudden decrease in blood glucose (hypoglycemia), especially if you use insulin or take certain oral diabetes medicines. Hypoglycemia can be a life-threatening condition. Symptoms of hypoglycemia (sleepiness, dizziness, and confusion) are similar to symptoms of having too much alcohol. If your health care provider says that alcohol is safe for you, follow these guidelines:  Limit alcohol intake to no more than 1 drink per day for nonpregnant women and 2 drinks per day for men. One drink equals 12 oz of beer, 5 oz of wine, or 1 oz of hard liquor.  Do not drink on an empty stomach.  Keep yourself hydrated with water, diet soda, or unsweetened iced tea.  Keep in mind that regular soda, juice, and other mixers may contain a lot of sugar and must be counted as carbs. What are tips for following this plan?  Reading food labels  Start by checking the serving size on the "Nutrition Facts" label of packaged foods and drinks. The amount of calories, carbs, fats, and other nutrients listed on the label is based on one serving of the item. Many items contain more than one serving per package.  Check the total grams (g) of carbs in one serving. You can calculate the number of servings of carbs in one serving by dividing the total carbs by 15. For example, if a food has 30 g of total carbs, it would be equal to 2 servings of carbs.  Check the number of grams (g) of saturated and trans fats in one serving. Choose foods that have  low or no amount of these fats.  Check the number of milligrams (mg) of salt (sodium) in one serving. Most people should limit total sodium intake to less than 2,300 mg per day.  Always check the nutrition information of foods labeled as "low-fat" or "nonfat". These foods may be higher in added sugar or refined carbs and should be avoided.  Talk to your dietitian to identify your daily goals for nutrients listed on the label. Shopping  Avoid buying canned, premade, or processed  foods. These foods tend to be high in fat, sodium, and added sugar.  Shop around the outside edge of the grocery store. This includes fresh fruits and vegetables, bulk grains, fresh meats, and fresh dairy. Cooking  Use low-heat cooking methods, such as baking, instead of high-heat cooking methods like deep frying.  Cook using healthy oils, such as olive, canola, or sunflower oil.  Avoid cooking with butter, cream, or high-fat meats. Meal planning  Eat meals and snacks regularly, preferably at the same times every day. Avoid going long periods of time without eating.  Eat foods high in fiber, such as fresh fruits, vegetables, beans, and whole grains. Talk to your dietitian about how many servings of carbs you can eat at each meal.  Eat 4-6 ounces (oz) of lean protein each day, such as lean meat, chicken, fish, eggs, or tofu. One oz of lean protein is equal to: ? 1 oz of meat, chicken, or fish. ? 1 egg. ?  cup of tofu.  Eat some foods each day that contain healthy fats, such as avocado, nuts, seeds, and fish. Lifestyle  Check your blood glucose regularly.  Exercise regularly as told by your health care provider. This may include: ? 150 minutes of moderate-intensity or vigorous-intensity exercise each week. This could be brisk walking, biking, or water aerobics. ? Stretching and doing strength exercises, such as yoga or weightlifting, at least 2 times a week.  Take medicines as told by your health care provider.  Do not use any products that contain nicotine or tobacco, such as cigarettes and e-cigarettes. If you need help quitting, ask your health care provider.  Work with a Social worker or diabetes educator to identify strategies to manage stress and any emotional and social challenges. Questions to ask a health care provider  Do I need to meet with a diabetes educator?  Do I need to meet with a dietitian?  What number can I call if I have questions?  When are the best times  to check my blood glucose? Where to find more information:  American Diabetes Association: diabetes.org  Academy of Nutrition and Dietetics: www.eatright.[employer] of Diabetes and Digestive and Kidney Diseases (NIH): [employer] Summary  A healthy meal plan will help you control your blood glucose and maintain a healthy lifestyle.  Working with a diet and nutrition specialist (dietitian) can help you make a meal plan that is best for you.  Keep in mind that carbohydrates (carbs) and alcohol have immediate effects on your blood glucose levels. It is important to count carbs and to use alcohol carefully. This information is not intended to replace advice given to you by your health care provider. Make sure you discuss any questions you have with your health care provider. Document Revised: 05/17/2017 Document Reviewed: 07/09/2016 Elsevier Patient Education  [place].  Constipation, Adult Constipation is when a person has fewer bowel movements in a week than normal, has difficulty having a bowel movement, or has stools that  are dry, hard, or larger than normal. Constipation may be caused by an underlying condition. It may become worse with age if a person takes certain medicines and does not take in enough fluids. Follow these instructions at home: Eating and drinking   Eat foods that have a lot of fiber, such as fresh fruits and vegetables, whole grains, and beans.  Limit foods that are high in fat, low in fiber, or overly processed, such as french fries, hamburgers, cookies, candies, and soda.  Drink enough fluid to keep your urine clear or pale yellow. General instructions  Exercise regularly or as told by your health care provider.  Go to the restroom when you have the urge to go. Do not hold it in.  Take over-the-counter and prescription medicines only as told by your health care provider. These include any fiber supplements.  Practice pelvic  floor retraining exercises, such as deep breathing while relaxing the lower abdomen and pelvic floor relaxation during bowel movements.  Watch your condition for any changes.  Keep all follow-up visits as told by your health care provider. This is important. Contact a health care provider if:  You have pain that gets worse.  You have a fever.  You do not have a bowel movement after 4 days.  You vomit.  You are not hungry.  You lose weight.  You are bleeding from the anus.  You have thin, pencil-like stools. Get help right away if:  You have a fever and your symptoms suddenly get worse.  You leak stool or have blood in your stool.  Your abdomen is bloated.  You have severe pain in your abdomen.  You feel dizzy or you faint. This information is not intended to replace advice given to you by your health care provider. Make sure you discuss any questions you have with your health care provider. Document Revised: 05/17/2017 Document Reviewed: 11/23/2015 Elsevier Patient Education  2020 [employer].

## 2019-11-10 NOTE — Progress Notes (Signed)
Chief Complaint  Patient presents with  . Follow-up  . Medication Management    pt is taking mirilax powder OTC for constipation and states this is not working. Pt has history of RX for Glucolax powder and colace   F/u  1. DM 2 uncontrolled on novoloin N 20 units at times 10 to 15 to 20 u qhsand Novolin R SSI per scale at times giving 2-3 x per day  09/21/19 A1C 9.9 pt agreeable to see endocrine  She reports feet peeled after covid 19 shot  2. Constipation rec meds senna and colace tried miralax and milk of mag in the past likes generic miralax and wants refill  3. 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 Due to chronic illness she report at times forgets to take medication and needs more help at home night and day. She /o chronic pain 6/10 and had a recent fall   Review of Systems  Constitutional: Negative for weight loss.  HENT: Negative for hearing loss.   Eyes: Negative for blurred vision.  Respiratory: Negative for shortness of breath.   Cardiovascular: Negative for chest pain.  Gastrointestinal: Negative for abdominal pain.  Musculoskeletal: Positive for falls and joint pain.  Skin: Negative for rash.  Psychiatric/Behavioral: Positive for memory loss.   Past Medical History:  Diagnosis Date  . Arthritis   . Chickenpox   . Diabetes mellitus without complication ([place])    one elevated reading/ no treatment  . Diverticulitis   . GI bleed   . High cholesterol   . History of blood transfusion   . Hypertension   . Renal insufficiency    Past Surgical History:  Procedure Laterality Date  . APPENDECTOMY    . CHOLECYSTECTOMY    . ECTOPIC PREGNANCY SURGERY    . EYE SURGERY     bilateral cataracts  . EYE SURGERY     02/11/2019 repair hole in right eye   . gallbladder     . HYSTEROSCOPY WITH D & C N/A 10/26/2016   Procedure: DILATATION AND CURETTAGE /HYSTEROSCOPY;  Surgeon:  Benjaman Kindler, MD;  Location: [facility] ORS;  Service: Gynecology;  Laterality: N/A;  . HYSTEROSCOPY WITH D & C N/A 07/07/2018   Procedure: DILATATION AND CURETTAGE /HYSTEROSCOPY;  Surgeon: Benjaman Kindler, MD;  Location: [facility] ORS;  Service: Gynecology;  Laterality: N/A;  . THYROIDECTOMY, PARTIAL     Family History  Problem Relation Age of Onset  . Diabetes Mother   . Hypertension Mother   . Stroke Mother   . Diabetes Other   . Healthy Father   . Diabetes Sister   . Heart disease Sister    Social History   Socioeconomic History  . Marital status: Widowed    Spouse name: Not on file  . Number of children: Not on file  . Years of education: Not on file  . Highest education level: Not on file  Occupational History  . Not on file  Tobacco Use  . Smoking status: Never Smoker  . Smokeless tobacco: Never Used  Substance and Sexual Activity  . Alcohol use: No  . Drug use: No  . Sexual activity: Not on file  Other Topics Concern  . Not on file  Social History Narrative   Lives alone    From [place]   Widowed    Social Determinants of Health  Financial Resource Strain:   . Difficulty of Paying Living Expenses:   Food Insecurity:   . Worried About Charity fundraiser in the Last Year:   . Arboriculturist in the Last Year:   Transportation Needs:   . Film/video editor (Medical):   Marland Kitchen Lack of Transportation (Non-Medical):   Physical Activity:   . Days of Exercise per Week:   . Minutes of Exercise per Session:   Stress:   . Feeling of Stress :   Social Connections:   . Frequency of Communication with Friends and Family:   . Frequency of Social Gatherings with Friends and Family:   . Attends Religious Services:   . Active Member of Clubs or Organizations:   . Attends Archivist Meetings:   Marland Kitchen Marital Status:   Intimate Partner Violence:   . Fear of Current or Ex-Partner:   . Emotionally Abused:   Marland Kitchen Physically Abused:   . Sexually Abused:    Current Meds   Medication Sig  . amLODipine (NORVASC) 5 MG tablet Take 1 tablet (5 mg total) by mouth daily.  . Ascorbic Acid (VITAMIN C PO) Take 500 mg by mouth daily.  . blood glucose meter kit and supplies KIT Accu chek, Dx code E11.65, check 3 times daily  . cloNIDine (CATAPRES) 0.2 MG tablet Take 1 tablet (0.2 mg total) by mouth 3 (three) times daily.  Marland Kitchen ezetimibe (ZETIA) 10 MG tablet Take 1 tablet (10 mg total) by mouth daily.  Marland Kitchen glucose blood test strip Use as instructed tid  Accuc[place]k Guide  . insulin NPH Human (NOVOLIN N) 100 UNIT/ML injection INJECT 20 UNITS IN THE MORNING AND 20 UNITS IN THE EVENING WITH A MEAL (Patient taking differently: INJECT 20 UNITS IN THE MORNING AND 20 UNITS IN THE EVENING WITH A MEAL (pt actually taking 20 qam and 15 units qpm))  . insulin regular (NOVOLIN R) 100 units/mL injection 15 minutes before meals tid. 131-180 2 units 181 to 240 4 units 241-300 6units 301-350 8 units 351-400 10 units>400 12 units.  Keep glucose tablets for sugar < 70  . Insulin Syringe-Needle U-100 (B-D INS SYR ULTRAFINE .5CC/30G) 30G X 1/2" 0.5 ML MISC 1 Device by Does not apply route daily. Up to 5x per day with insulin bid (NPH) and tid (Regular)  . Multiple Vitamin (MULTIVITAMIN WITH MINERALS) TABS tablet Take 1 tablet by mouth daily.  . rosuvastatin (CRESTOR) 10 MG tablet TAKE 1 TABLET (10 MG TOTAL) BY MOUTH DAILY.  Marland Kitchen spironolactone (ALDACTONE) 25 MG tablet Take 1 tablet (25 mg total) by mouth daily.  Marland Kitchen torsemide (DEMADEX) 10 MG tablet Take 10 mg by mouth daily as needed.  . [DISCONTINUED] insulin regular (NOVOLIN R) 100 units/mL injection 15 minutes before meals tid. 131-180 2 units 181 to 240 4 units 241-300 6units 301-350 8 units 351-400 10 units>400 12 units.  Keep glucose tablets for sugar < 70   Allergies  Allergen Reactions  . Penicillins Rash    Has patient had a PCN reaction causing immediate rash, facial/tongue/throat swelling, SOB or lightheadedness with hypotension: Yes Has patient  had a PCN reaction causing severe rash involving mucus membranes or skin necrosis: No Has patient had a PCN reaction that required hospitalization No Has patient had a PCN reaction occurring within the last 10 years: Yes If all of the above answers are "NO", then may proceed with Cephalosporin use.    Recent Results (from the past 2160 hour(s))  TSH  Status: None   Collection Time: 09/21/19 10:09 AM  Result Value Ref Range   TSH 2.16 0.35 - 4.50 uIU/mL  Hemoglobin A1c     Status: Abnormal   Collection Time: 09/21/19 10:09 AM  Result Value Ref Range   Hgb A1c MFr Bld 9.8 (H) 4.6 - 6.5 %    Comment: Glycemic Control Guidelines for People with Diabetes:Non Diabetic:  <6%Goal of Therapy: <7%Additional Action Suggested:  >8%   CBC with Differential/Platelet     Status: Abnormal   Collection Time: 09/21/19 10:09 AM  Result Value Ref Range   WBC 7.7 4.0 - 10.5 K/uL   RBC 4.68 3.87 - 5.11 Mil/uL   Hemoglobin 13.7 12.0 - 15.0 g/dL   HCT 41.8 36.0 - 46.0 %   MCV 89.2 78.0 - 100.0 fl   MCHC 32.8 30.0 - 36.0 g/dL   RDW 13.2 11.5 - 15.5 %   Platelets 252.0 150.0 - 400.0 K/uL   Neutrophils Relative % 39.9 (L) 43.0 - 77.0 %   Lymphocytes Relative 48.3 (H) 12.0 - 46.0 %   Monocytes Relative 8.0 3.0 - 12.0 %   Eosinophils Relative 1.9 0.0 - 5.0 %   Basophils Relative 1.9 0.0 - 3.0 %   Neutro Abs 3.1 1.4 - 7.7 K/uL   Lymphs Abs 3.7 0.7 - 4.0 K/uL   Monocytes Absolute 0.6 0.1 - 1.0 K/uL   Eosinophils Absolute 0.1 0.0 - 0.7 K/uL   Basophils Absolute 0.1 0.0 - 0.1 K/uL  Lipid panel     Status: None   Collection Time: 09/21/19 10:09 AM  Result Value Ref Range   Cholesterol 141 0 - 200 mg/dL    Comment: ATP III Classification       Desirable:  < 200 mg/dL               Borderline High:  200 - 239 mg/dL          High:  > = 240 mg/dL   Triglycerides 137.0 0.0 - 149.0 mg/dL    Comment: Normal:  <150 mg/dLBorderline High:  150 - 199 mg/dL   HDL 56.00 >39.00 mg/dL   VLDL 27.4 0.0 - 40.0 mg/dL    LDL Cholesterol 58 0 - 99 mg/dL   Total CHOL/HDL Ratio 3     Comment:                Men          Women1/2 Average Risk     3.4          3.3Average Risk          5.0          4.42X Average Risk          9.6          7.13X Average Risk          15.0          11.0                       NonHDL 84.93     Comment: NOTE:  Non-HDL goal should be 30 mg/dL higher than patient's LDL goal (i.e. LDL goal of < 70 mg/dL, would have non-HDL goal of < 100 mg/dL)  Comprehensive metabolic panel     Status: Abnormal   Collection Time: 09/21/19 10:09 AM  Result Value Ref Range   Sodium 139 135 - 145 mEq/L   Potassium 3.9 3.5 - 5.1 mEq/L  Chloride 100 96 - 112 mEq/L   CO2 30 19 - 32 mEq/L   Glucose, Bld 252 (H) 70 - 99 mg/dL   BUN 15 6 - 23 mg/dL   Creatinine, Ser 1.11 0.40 - 1.20 mg/dL   Total Bilirubin 0.5 0.2 - 1.2 mg/dL   Alkaline Phosphatase 51 39 - 117 U/L   AST 26 0 - 37 U/L   ALT 30 0 - 35 U/L   Total Protein 7.4 6.0 - 8.3 g/dL   Albumin 4.2 3.5 - 5.2 g/dL   GFR 57.13 (L) >60.00 mL/min   Calcium 10.3 8.4 - 10.5 mg/dL  Lipase, blood     Status: Abnormal   Collection Time: 10/24/19  3:24 PM  Result Value Ref Range   Lipase 180 (H) 11 - 51 U/L    Comment: Performed at [facility], [place] [address] [place], [place] [address]  Comprehensive metabolic panel     Status: Abnormal   Collection Time: 10/24/19  3:24 PM  Result Value Ref Range   Sodium 136 135 - 145 mmol/L   Potassium 3.8 3.5 - 5.1 mmol/L   Chloride 99 98 - 111 mmol/L   CO2 25 22 - 32 mmol/L   Glucose, Bld 326 (H) 70 - 99 mg/dL    Comment: Glucose reference range applies only to samples taken after fasting for at least 8 hours.   BUN 9 8 - 23 mg/dL   Creatinine, Ser 1.08 (H) 0.44 - 1.00 mg/dL   Calcium 9.7 8.9 - 10.3 mg/dL   Total Protein 7.2 6.5 - 8.1 g/dL   Albumin 3.9 3.5 - 5.0 g/dL   AST 37 15 - 41 U/L   ALT 34 0 - 44 U/L   Alkaline Phosphatase 43 38 - 126 U/L   Total Bilirubin 1.4 (H) 0.3 - 1.2 mg/dL   GFR calc non  Af Amer 48 (L) >60 mL/min   GFR calc Af Amer 56 (L) >60 mL/min   Anion gap 12 5 - 15    Comment: Performed at [facility] [address] [place], [place] [address]  CBC     Status: None   Collection Time: 10/24/19  3:24 PM  Result Value Ref Range   WBC 9.5 4.0 - 10.5 K/uL   RBC 5.03 3.87 - 5.11 MIL/uL   Hemoglobin 14.3 12.0 - 15.0 g/dL   HCT 45.4 36.0 - 46.0 %   MCV 90.3 80.0 - 100.0 fL   MCH 28.4 26.0 - 34.0 pg   MCHC 31.5 30.0 - 36.0 g/dL   RDW 13.4 11.5 - 15.5 %   Platelets 254 150 - 400 K/uL   nRBC 0.0 0.0 - 0.2 %    Comment: Performed at [facility], [place] [address] [place], [place] [address]  CBG monitoring, ED     Status: Abnormal   Collection Time: 10/24/19  3:33 PM  Result Value Ref Range   Glucose-Capillary 290 (H) 70 - 99 mg/dL    Comment: Glucose reference range applies only to samples taken after fasting for at least 8 hours.  Urinalysis, Routine w reflex microscopic     Status: Abnormal   Collection Time: 10/24/19  4:30 PM  Result Value Ref Range   Color, Urine YELLOW YELLOW   APPearance CLEAR CLEAR   Specific Gravity, Urine 1.008 1.005 - 1.030   pH 6.0 5.0 - 8.0   Glucose, UA >=500 (A) NEGATIVE mg/dL   Hgb urine dipstick SMALL (A)  NEGATIVE   Bilirubin Urine NEGATIVE NEGATIVE   Ketones, ur NEGATIVE NEGATIVE mg/dL   Protein, ur 100 (A) NEGATIVE mg/dL   Nitrite NEGATIVE NEGATIVE   Leukocytes,Ua NEGATIVE NEGATIVE   RBC / HPF 6-10 0 - 5 RBC/hpf   WBC, UA 0-5 0 - 5 WBC/hpf   Bacteria, UA NONE SEEN NONE SEEN   Squamous Epithelial / LPF 0-5 0 - 5    Comment: Performed at [facility], [place] [address] [place], [place] [address]   Objective  Body mass index is 45.84 kg/m. Wt Readings from Last 3 Encounters:  11/10/19 258 lb 12.8 oz (117.4 kg)  08/25/19 257 lb 8 oz (116.8 kg)  08/17/19 257 lb (116.6 kg)   Temp Readings from Last 3 Encounters:  11/10/19 (!) 97 F (36.1 C) (Temporal)  10/24/19 98.8 F (37.1 C) (Oral)  10/19/19 97.8 F  (36.6 C)   BP Readings from Last 3 Encounters:  11/10/19 (!) 150/78  10/24/19 112/79  08/25/19 114/72   Pulse Readings from Last 3 Encounters:  11/10/19 74  10/24/19 94  08/25/19 71    Physical Exam Vitals and nursing note reviewed.  Constitutional:      Appearance: Normal appearance. She is well-developed and well-groomed. She is morbidly obese.  HENT:     Head: Normocephalic and atraumatic.  Eyes:     Conjunctiva/sclera: Conjunctivae normal.     Pupils: Pupils are equal, round, and reactive to light.  Cardiovascular:     Rate and Rhythm: Normal rate and regular rhythm.     Heart sounds: Normal heart sounds. No murmur.  Pulmonary:     Effort: Pulmonary effort is normal.     Breath sounds: Normal breath sounds.  Skin:    General: Skin is warm and dry.  Neurological:     General: No focal deficit present.     Mental Status: She is alert and oriented to person, place, and time. Mental status is at baseline.     Gait: Gait normal.     Comments: BL walks with walker   Psychiatric:        Attention and Perception: Attention and perception normal.        Mood and Affect: Mood and affect normal.        Speech: Speech normal.        Behavior: Behavior normal. Behavior is cooperative.        Thought Content: Thought content normal.        Cognition and Memory: Cognition and memory normal.        Judgment: Judgment normal.     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 Jeanette Caprice endocrine Cont novolin N 20 units bid to 20 u in am and 10-20 u qhs Prn glucose tablets   Constipation, unspecified constipation type - Plan: polyethylene glycol powder (GLYCOLAX/MIRALAX) 17 GM/SCOOP powder  Abnormal gait  Cont walking with walker   HM Flu  shotutd 2/2 covid vxs had  Check on other vaccines (I.e Tdap, prevnar, pna 23, shingrix)   mammo had9/12/20normal Solisordered   Pap had 05/28/18 negative [facility] 07/07/18 endometrial bx neg and same 10/26/16 Of and off bleeding f/u ob/gyn and given progestin 08/11/19 did not pick up   DEXA pt declines for now -no extra  Ca h/o hyperCa  Colonoscopy Dr. Allen Norris in [facility] -09/10/14 diverticulosis sessile inflammatory polyp=inflammatory and EGD chronic active gastritis -f/u prn does not want to do further unless needed   CT chestnone on filenever smoker CXR 01/20/18 pulm vascular congestion otherwise neg cxr 06/22/19 with scarring   02/03/18 thyroid bx negative   Of note CT renal 07/16/16 c/w bulging disc and L4/5 spinal stenosis  [facility] saw 03/29/19 closed fx lateral malleolus right fibular broken ankle Carlynn Spry [facility] urgent care  Provider: Dr. Olivia Mackie McLean-Scocuzza-Internal Medicine

## 2019-11-11 ENCOUNTER — Telehealth: Payer: Self-pay | Admitting: Internal Medicine

## 2019-11-11 ENCOUNTER — Ambulatory Visit: Payer: Medicare Other | Admitting: Internal Medicine

## 2019-11-11 NOTE — Telephone Encounter (Signed)
Verbal given  Also fax copy of Rx printed and placed in fax box yesterday   Thanks [place]

## 2019-11-11 NOTE — Telephone Encounter (Signed)
Sent to fax [phone])

## 2019-11-11 NOTE — Telephone Encounter (Signed)
Pharmacy called and wanted a verbal given for the Novolin R medication. For some reason part of it was deleted out of their system. Verbal was given.

## 2019-11-11 NOTE — Telephone Encounter (Signed)
Pt said she went to [facility] and they told her they didn't have her prescription for Insulin regular. It shows it was printed on 11/10/19. Please contact pharmacy and then call patient when this is sent.

## 2019-11-17 ENCOUNTER — Telehealth: Payer: Self-pay | Admitting: Internal Medicine

## 2019-11-17 DIAGNOSIS — K5900 Constipation, unspecified: Secondary | ICD-10-CM | POA: Insufficient documentation

## 2019-11-17 HISTORY — DX: Constipation, unspecified: K59.00

## 2019-11-17 NOTE — Telephone Encounter (Signed)
  Community Resource Referral   [place] 11/17/2019  1st Attempt  DOB: [birth year]   AGE: 79 y.o.   GENDER: female   PCP McLean-Scocuzza, Nino Glow, MD.   Called pt's granddaughter regarding Community Resource Referral for housing resources. Will email her resources for [facility].  [place] . [email or web address]  [phone]  ye

## 2019-11-17 NOTE — Telephone Encounter (Signed)
[facility]  [place], [place] [address]  [phone]  Germain Osgood, MD  [place]  [facility] [place], [place] [address]  [phone]  [phone] (Fax)    Call and get pt late am appt not with above but with Dr. Honor Junes

## 2019-11-17 NOTE — Telephone Encounter (Signed)
Patient returned office phone call. 

## 2019-11-17 NOTE — Telephone Encounter (Signed)
Called [facility]. They state they do not have anything on file for her. Looked and did not see the referral in the system.   Pended for your approval or denial.

## 2019-11-17 NOTE — Telephone Encounter (Signed)
Patient is already established at [facility] with Dr Gabriel Carina. Called back and initiated transfer request for Dr Honor Junes. He is out of the office for the week and they will call the patient next week with his response.   Will inform the Patient.   Left message to return call.

## 2019-11-17 NOTE — Addendum Note (Signed)
Addended by: Thressa Sheller on: 11/17/2019 11:28 AM   Modules accepted: Orders

## 2019-11-17 NOTE — Telephone Encounter (Signed)
Patient informed and verbalized understanding.  She will await a call.

## 2019-11-17 NOTE — Addendum Note (Signed)
Addended by: Thressa Sheller on: 11/17/2019 10:26 AM   Modules accepted: Orders

## 2019-11-19 ENCOUNTER — Other Ambulatory Visit: Payer: Self-pay | Admitting: *Deleted

## 2019-11-19 ENCOUNTER — Ambulatory Visit: Payer: Medicare Other | Admitting: Pharmacist

## 2019-11-19 DIAGNOSIS — E1165 Type 2 diabetes mellitus with hyperglycemia: Secondary | ICD-10-CM

## 2019-11-19 NOTE — Patient Outreach (Signed)
[facility]) Care Management  11/19/2019  Alexis Farmer Mar 12, 1941 [id number]   CSW received referral on 11/17/2019 for food assistance.  CSW was able to reach pt by phone today and confirmed pt's identity.  CSW introduced self and attempted to explain to pt why I was calling; "to assist with community resources that may be helpful". Pt voiced frustration with all the phone calls and did not want to talk further about possible needs or benefits.  "No one can help me but God".  CSW attempted to explain to pt the specific referral for meal delivery resource, however, she stated she was not comfortable with food coming from outside with the pandemic and germs around.  CSW validated her feelings and offered to call her back in a few weeks; to which she was ok.  CSW will plan to outreach pt again in the next few weeks to re-offer the meal delivery service as well as any other support services that may be identified.  CSW will update [facility] team and PCP to above.   Eduard Clos, MSW, [place] Worker  [name] [phone]

## 2019-11-19 NOTE — Patient Instructions (Signed)
Visit Information  Goals Addressed            This Visit's Progress     Patient Stated   . PharmD "I don't want to change medications" (pt-stated)       CARE PLAN ENTRY (see longtitudinal plan of care for additional care plan information)  Current Barriers:  . Diabetes: uncontrolled; complicated by chronic medical conditions including HTN, CHF, PAD, CKD, most recent A1c 9.8% o Reports she has no interested in changing medications.  o Noted that her biggest concern today is hypoglycemia. She is afraid that she is going to have a hypoglycemic coma and not wake up . Most recent eGFR: 57 mL/min . Current antihyperglycemic regimen: Novolin N 20 units QAM, 15 units QPM; Novoling R sliding scale . Reports issues w/ hypoglycemia . Cardiovascular risk reduction o Current hypertensive regimen: amlodipine 5 mg daily, clonidine 0.2 mg TID, spironolactone 25 mg daily, torsemide 10 mg daily  o Current hyperlipidemia regimen: ezetimibe 10 mg daily , rosuvastatin 10 mg daily   Pharmacist Clinical Goal(s):  Marland Kitchen Over the next 90 days, patient will work with PharmD and primary care provider to address optimized medication management  Interventions: . Given patient's uncontrolled A1c with hypoglycemia, attempted to review other options that would reduce risk of hypoglycemia (like better basal bolus regimen, GLP + basal regimen, etc). Patient was not interested in discussing with me today, and plans to discuss w/ endocrinology at appointment next month.  . Likely that patient would qualify for patient assistance for safer DM regimen. Would be more than happy to help coordinate that process.   Patient Self Care Activities:  . Patient will take medications as prescribed . Patient will report any questions or concerns to provider   Initial goal documentation        Patient verbalizes understanding of instructions provided today.   Plan: - Scheduled f/u call in ~ 6 weeks  Catie Darnelle Maffucci, PharmD,  [place], [place] Pharmacist [place] [phone]

## 2019-11-19 NOTE — Chronic Care Management (AMB) (Signed)
Chronic Care Management   Note  11/19/2019 Name: Alexis Farmer MRN: [id number] DOB: Nov 18, 1940   Subjective:  Alexis Farmer is a 79 y.o. year old female who is a primary care patient of Alexis Farmer, Alexis Glow, MD. The CCM team was consulted for assistance with chronic disease management and care coordination needs.     Alexis Farmer was given information about Chronic Care Management services today including:  1. CCM service includes personalized support from designated clinical staff supervised by her physician, including individualized plan of care and coordination with other care providers 2. 24/7 contact phone numbers for assistance for urgent and routine care needs. 3. Service will only be billed when office clinical staff spend 20 minutes or more in a month to coordinate care. 4. Only one practitioner may furnish and bill the service in a calendar month. 5. The patient may stop CCM services at any time (effective at the end of the month) by phone call to the office staff. 6. The patient will be responsible for cost sharing (co-pay) of up to 20% of the service fee (after annual deductible is met).  Patient agreed to services and verbal consent obtained.   Review of patient status, including review of consultants reports, laboratory and other test data, was performed as part of comprehensive evaluation and provision of chronic care management services.   SDOH (Social Determinants of Health) assessments and interventions performed:    Objective:  Lab Results  Component Value Date   CREATININE 1.08 (H) 10/24/2019   CREATININE 1.11 09/21/2019   CREATININE 1.0 02/19/2019    Lab Results  Component Value Date   HGBA1C 9.8 (H) 09/21/2019       Component Value Date/Time   CHOL 141 09/21/2019 1009   TRIG 137.0 09/21/2019 1009   HDL 56.00 09/21/2019 1009   CHOLHDL 3 09/21/2019 1009   VLDL 27.4 09/21/2019 1009   LDLCALC 58 09/21/2019 1009   LDLDIRECT 96.0 10/28/2018 0951     Clinical ASCVD: No  The ASCVD Risk score Alexis Bussing DC Jr., et al., 2013) failed to calculate for the following reasons:   The 2013 ASCVD risk score is only valid for ages 75 to 35    BP Readings from Last 3 Encounters:  11/10/19 (!) 150/78  10/24/19 112/79  08/25/19 114/72    Allergies  Allergen Reactions  . Penicillins Rash    Has patient had a PCN reaction causing immediate rash, facial/tongue/throat swelling, SOB or lightheadedness with hypotension: Yes Has patient had a PCN reaction causing severe rash involving mucus membranes or skin necrosis: No Has patient had a PCN reaction that required hospitalization No Has patient had a PCN reaction occurring within the last 10 years: Yes If all of the above answers are "NO", then may proceed with Cephalosporin use.     Medications Reviewed Today    Reviewed by Alexis Farmer, Alexis Glow, MD (Physician) on 11/10/19 at 1235  Med List Status: <None>  Medication Order Taking? Sig Documenting Provider Last Dose Status Informant  amLODipine (NORVASC) 5 MG tablet [id number] Yes Take 1 tablet (5 mg total) by mouth daily. Alexis Farmer, Alexis Glow, MD Taking Active   Ascorbic Acid (VITAMIN C PO) [id number] Yes Take 500 mg by mouth daily. [provider] Taking Active   blood glucose meter kit and supplies KIT [id number] Yes Accu chek, Dx code E11.65, check 3 times daily Leone Haven, MD Taking Active Self  cloNIDine (CATAPRES) 0.2 MG tablet [id number] Yes Take 1 tablet (0.2 mg total) by mouth  3 (three) times daily. Alexis Farmer, Alexis Glow, MD Taking Active   ezetimibe (ZETIA) 10 MG tablet [id number] Yes Take 1 tablet (10 mg total) by mouth daily. Alexis Farmer, Alexis Glow, MD Taking Active   glucose 4 GM chewable tablet [id number]  Chew 1 tablet (4 g total) by mouth as needed for low blood sugar. <70 Alexis Farmer, Alexis Glow, MD  Active   glucose blood test strip [id number] Yes Use as instructed tid  Accuchek Guide Alexis Farmer, Alexis Glow,  MD Taking Active   insulin NPH Human (NOVOLIN N) 100 UNIT/ML injection [id number] Yes INJECT 20 UNITS IN THE MORNING AND 20 UNITS IN THE EVENING WITH A MEAL  Patient taking differently: INJECT 20 UNITS IN THE MORNING AND 20 UNITS IN THE EVENING WITH A MEAL (pt actually taking 20 qam and 15 units qpm)   Alexis Farmer, Alexis Glow, MD Taking Active   insulin regular (NOVOLIN R) 100 units/mL injection [id number] Yes 15 minutes before meals tid. 131-180 2 units 181 to 240 4 units 241-300 6units 301-350 8 units 351-400 10 units>400 12 units.  Keep glucose tablets for sugar < 70 Alexis Farmer, Alexis Glow, MD  Active   Insulin Syringe-Needle U-100 (B-D INS SYR ULTRAFINE .5CC/30G) 30G X 1/2" 0.5 ML MISC [id number] Yes 1 Device by Does not apply route daily. Up to 5x per day with insulin bid (NPH) and tid (Regular) Alexis Farmer, Alexis Glow, MD Taking Active   meclizine (ANTIVERT) 12.5 MG tablet [id number] No Take 1 tablet (12.5 mg total) by mouth daily as needed for dizziness.  Patient not taking: Reported on 11/10/2019   Alexis Farmer, Alexis Glow, MD Not Taking Active   Multiple Vitamin (MULTIVITAMIN WITH MINERALS) TABS tablet [id number] Yes Take 1 tablet by mouth daily. [provider] Taking Active Self  polyethylene glycol powder (GLYCOLAX/MIRALAX) 17 GM/SCOOP powder [id number]  Take 17 g by mouth daily as needed for moderate constipation or severe constipation. Can take up to 2x per day prn. Mix with 8 ounces of liquid Alexis Farmer, Alexis Glow, MD  Active   progesterone (PROMETRIUM) 200 MG capsule [id number] No Take by mouth. [provider] Not Taking Active   rosuvastatin (CRESTOR) 10 MG tablet [id number] Yes TAKE 1 TABLET (10 MG TOTAL) BY MOUTH DAILY. Alexis Farmer, Alexis Glow, MD Taking Active   spironolactone (ALDACTONE) 25 MG tablet [id number] Yes Take 1 tablet (25 mg total) by mouth daily. Alexis Farmer, Alexis Glow, MD Taking Active   torsemide (DEMADEX) 10 MG tablet [id number] Yes Take 10 mg by  mouth daily as needed. [provider] Taking Active            Assessment:   Goals Addressed            This Visit's Progress     Patient Stated   . PharmD "I don't want to change medications" (pt-stated)       CARE PLAN ENTRY (see longtitudinal plan of care for additional care plan information)  Current Barriers:  . Diabetes: uncontrolled; complicated by chronic medical conditions including HTN, CHF, PAD, CKD, most recent A1c 9.8% o Reports she has no interested in changing medications.  o Noted that her biggest concern today is hypoglycemia. She is afraid that she is going to have a hypoglycemic coma and not wake up . Most recent eGFR: 57 mL/min . Current antihyperglycemic regimen: Novolin N 20 units QAM, 15 units QPM; Novoling R sliding scale . Reports issues w/ hypoglycemia . Cardiovascular risk reduction o Current hypertensive regimen: amlodipine 5 mg daily, clonidine 0.2 mg TID,  spironolactone 25 mg daily, torsemide 10 mg daily  o Current hyperlipidemia regimen: ezetimibe 10 mg daily , rosuvastatin 10 mg daily   Pharmacist Clinical Goal(s):  Marland Kitchen Over the next 90 days, patient will work with PharmD and primary care provider to address optimized medication management  Interventions: . Given patient's uncontrolled A1c with hypoglycemia, attempted to review other options that would reduce risk of hypoglycemia (like better basal bolus regimen, GLP + basal regimen, etc). Patient was not interested in discussing with me today, and plans to discuss w/ endocrinology at appointment next month.  . Likely that patient would qualify for patient assistance for safer DM regimen. Would be more than happy to help coordinate that process.   Patient Self Care Activities:  . Patient will take medications as prescribed . Patient will report any questions or concerns to provider   Initial goal documentation        Plan: - Scheduled f/u call in ~ 6 weeks  Catie Darnelle Maffucci,  PharmD, [place], [place] Pharmacist [place] [phone]

## 2019-11-25 ENCOUNTER — Other Ambulatory Visit: Payer: Self-pay | Admitting: *Deleted

## 2019-11-25 ENCOUNTER — Ambulatory Visit: Payer: Self-pay | Admitting: *Deleted

## 2019-11-25 NOTE — Patient Outreach (Signed)
[facility]) Care Management  11/25/2019  Alexis Farmer [birth year] [id number]   CSW attempted outreach call to pt today and was only able to leave message.  CSW will await callback from pt or try again in 3-4 business days if no return call is received.   CSW will update [facility] RN to above.   Eduard Clos, MSW, [place] Worker  [place] [phone]

## 2019-11-26 ENCOUNTER — Encounter: Payer: Self-pay | Admitting: *Deleted

## 2019-11-26 ENCOUNTER — Other Ambulatory Visit: Payer: Self-pay | Admitting: *Deleted

## 2019-11-26 NOTE — Patient Outreach (Signed)
[facility]) Care Management  11/26/2019  Alexis Farmer 28-Jan-1941 [id number]  [facility] Telephone Screen for MD referral  Referral Date: 11/17/19 Referral Source: Alexis Farmer  Referral Reason: referral request from [place]:  Please assign [name] below to a RN for uncontrolled Diabetes, A1C of 9.9.  She also needs SW for possible housing and food resources. We can set up Mom's meals with a diabetic meal plan for 2 weeks.  She has a Software engineer only (Alexis Farmer) at [employer] station=non-embedded for RN/SW.   Insurance: Alexis Farmer   [facility] RN CM received a message from this patients' [place] staff on 11/19/19 Patient's granddaughter, Alexis Farmer, works at [employer] indicated pt did not want to discuss her medications, as she was immediately on the defensive about not wanting to change things. However, very worried about hypoglycemia. Pharmacist tried to reiterate that she could be assisted to get on a regimen with a lower risk of hypoglycemia, but she was not interested. Patient did agree to talk to Alexis Farmer after her endo appointment next month, but she's pretty much planning on not following with him long term.   Care Guide provided some housing options to [place] and her mom, so they are working on investigating those.  THN SW also updated [facility] RN CM on 11/26/19 that a call attempt was made to patient   This [facility] RN CM outreached No answer. THN RN CM left HIPAA [facility] Portability and Accountability Act) compliant voicemail message along with CM's contact info.   Alexis Farmer called [facility] RN CM shortly after the message was left  [facility] RN CM discussed the purpose for leaving her a message. She confirmed she had not checked the message She discussed with [facility] RN CM that she has and prefers her flip phone in which she does share she has some difficulty operating Patient is able to verify HIPAA ([place])  identifiers, date of birth (DOB) and address  Initially she question how she could be assisted by [facility] RN CM but agrees to outreach after [facility] RN CM discussed assisting her with [facility] screening and identifying her needs She confirms she does need help with managing her diabetes   She confirms she previously lived in [place] and see medical providers in [place] preferably  Diabetes She shares her fear of having low cbg values as she recalls these episodes with her mother.  She confirms she monitors her cbg values "three to four times a day" She reports her cbg values are "up and down" She has been diagnosed for about thirty years She is not able to inform [facility] RN CM of her HgA1c value but believes it is ?7 She reports her cbg value on 11/26/19 morning was 80  She reports she is shaky but alert at 80 so she generally stays in the 200s She informs [facility] RN CM she is scheduled to see an endocrinologist in [place] soon but is not able to tell [facility] RN CM the endocrinologist's name.  THN RN CM was able to find Alexis Farmer and a 12/29/19 appointment in [facility] care everywhere Noted to have switched from Alexis Farmer to Alexis Farmer She reports she has not been seen by a nutritionist and is not on a diabetic diet but has been on a "diet"  Outreach to other [facility] staff discussed. She was encouraged to check her voice messages and to return calls to Alexis Farmer  is a  79 year old widowed female who has support of her family She reports having a large family of lots of a daughter, granddaughter and lots of grandchildren and great grandchildren. She reports to [facility] RN CM that she married young. She is from [place].   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, abdominal aortic atherosclerosis, GI bleed, rectal bleed, cervical spondylosis with radiculopathy, osteoarthritis, arthritis, disorder of right rotator cuff,  Pedal edema, morbid obesity on  11/10/19 weight =258 lbs Height=5'3" BMI =45.86, anxiety, depression , vertigo, chronic fatigue, bradycardia, constipation, falls hypercalcemia, retinal hole of right eye, postmenopausal bleeding, diverticulosis, history (hx) of blood transfusion, arthritis, 07/21/19 had covid vaccine,  never smoker   Fall recent x 1- [facility] saw 03/29/19 closed fx lateral malleolus right fibular broken ankle   DME walker cbg monitor (accu chek  guide To check 3 times a day)   Plan: [facility] RN CM will  outreach to patient again within the next 4-7 business days as she agreed to complete [facility] screening to identify needs and develop plans  [facility] RN CM sent a Gaffer with [facility] brochure, [place], [facility] consent form with return envelope and know before you go sheet enclosed for review  MD involvement barriers letter sent  Pt encouraged to return a call to [facility] RN CM prn  Routed note to MD  [facility] CM Care Plan Problem One     Most Recent Value  Care Plan Problem One knowledge deficit of home care for Diabetes  Role Documenting the Problem One Care Management Telephonic Coordinator  Care Plan for Problem One Active  THN Long Term Goal  over the next 90 days patient will be able to verbalize with outreach interventions to manage Diabetes  THN Long Term Goal Start Date 11/26/19  Interventions for Problem One Long Term Goal initiated [facility] screening for diabetic needs provided encouragement, discussed THN TN /SW services  [facility] CM Short Term Goal #1  Patient will be able to verbalize in the next 30 days 3 signs & symptoms to reprot to MD  [facility] CM Short Term Goal #1 Start Date 11/26/19      Joelene Millin L. Lavina Hamman,  RN, BSN, [place] Coordinator Office number [phone] Mobile number [phone]  Main THN number [phone] Fax number [phone]

## 2019-11-27 ENCOUNTER — Other Ambulatory Visit: Payer: Self-pay | Admitting: *Deleted

## 2019-11-27 NOTE — Patient Outreach (Signed)
[facility] [facility]) Care Management  11/27/2019  Shayla Heming 11/28/1940 [id number]   CSW received a return call/message from pt and CSW then returned pt's call.  Pt's identity was confirmed and CSW re-introduced self and reason for call.  Pt declines any meal assistance at this time; stating she does not want any possible exposures "with the pandemic still going on".  Pt shared; "I don't even get food from restaurants right now".  Pt denies any problems with getting food, meds or with transportation, sharing that her family helps her out a lot.  Pt agreeable to a follow up call in a "month or so".  CSW will plan to follow up at a later date to see if her interest or needs have changed.  Eduard Clos, MSW, [place] Worker  [place] [phone]

## 2019-11-30 ENCOUNTER — Other Ambulatory Visit: Payer: Self-pay | Admitting: *Deleted

## 2019-11-30 ENCOUNTER — Other Ambulatory Visit: Payer: Self-pay

## 2019-11-30 NOTE — Patient Outreach (Addendum)
[place] [facility] [facility]) Care Management  11/30/2019  Alexis Farmer Oct 27, 1940 [id number]   [facility] Telephone Screen for MD referral  Referral Date: 11/17/19 Referral Source: [place]  Referral Reason: referral request from [place]:  Please assign Ms. Silveria below to a RN for uncontrolled Diabetes, A1C of 9.9.  She also needs SW for possible housing and food resources. We can set up Mom's meals with a diabetic meal plan for 2 weeks.  She has a Software engineer only (Catie) at [employer] station=non-embedded for RN/SW.   Insurance: Ivery Quale   [facility] RN CM received a message from this patients' [place] staff on 11/19/19 Patient's granddaughter, Elvina Mattes, works at [employer] indicated pt did not want to discuss her medications, as she was immediately on the defensive about not wanting to change things. However, very worried about hypoglycemia. Pharmacist tried to reiterate that she could be assisted to get on a regimen with a lower risk of hypoglycemia, but she was not interested. Patient did agree to talk to Catie after her endo appointment next month, but she's pretty much planning on not following with him long term.   Care Guide provided some housing options to [place] and her mom, so they are working on investigating those.  THN SW also updated [facility] RN CM on 11/26/19 that a call attempt was made to patient   This [facility] RN CM outreached No answer. THN RN CM left HIPAA [facility] Portability and Accountability Act) compliant voicemail message along with CM's contact info.   Alexis Farmer called [facility] RN CM shortly after the message was left  [facility] RN CM discussed the purpose for leaving her a message. She confirmed she had not checked the message She discussed with [facility] RN CM that she has and prefers her flip phone in which she does share she has some difficulty operating Patient is able to verify HIPAA ([place])  identifiers, date of birth (DOB) and address  Initially she question how she could be assisted by [facility] RN CM but agrees to outreach after [facility] RN CM discussed assisting her with [facility] screening and identifying her needs She confirms she does need help with managing her diabetes   She confirms she previously lived in [place] and see medical providers in [place] preferably  Diabetes The diabetes assessment started  She is able to confirm her diabetes type 2 diagnosis and changing endocrinologist recently to Dr Jenetta Downer Dina Rich.  THN RN CM reviewed the medical conditions that can affect her diabetes  Her HgA2c is 9.8 and she reports changing her diet to get this value She confirms having diabetic DME at home She agrees to allow [facility] RN CM to speak with her endocrinologist office to request a nutritional/dietitian referral if needed per her insurance coverage She reports her mother and sister has diabetes She shares the loss of her mother from a diabetic coma and fears this may occur to her  [facility] RN Cm provided empathy and support with her gaining knowledge to manage her diabetes at home to prevent this from occurring.  THN RN CM had to redirect patient at intervals back to the subject being addressed  Alexis Farmer at the end of the partial assessment voiced appreciation and agreed to further outreach  Alexis Farmer also has a [facility] SW referral for food assistance   Social Alexis Farmer is a  79 year old widowed female who has support of her family She reports having a large  family of lots of a daughter, granddaughter and lots of grandchildren and great grandchildren. She reports to [facility] RN CM that she married young. She is from [place].   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, abdominal aortic atherosclerosis, GI bleed, rectal bleed, cervical spondylosis with radiculopathy, osteoarthritis, arthritis, disorder of right rotator cuff,  Pedal edema, morbid obesity on  11/10/19 weight =258 lbs Height=5'3" BMI =45.86, anxiety, depression , vertigo, chronic fatigue, bradycardia, constipation, falls hypercalcemia, retinal hole of right eye, postmenopausal bleeding, diverticulosis, history (hx) of blood transfusion, arthritis, 07/21/19 had covid vaccine,  never smoker   Fall recent x 1- [facility] saw 03/29/19 closed fx lateral malleolus right fibular broken ankle   DME walker cbg monitor (accu chek  guide To check 3 times a day)   Plan: [facility] RN CM will  outreach to patient again within the next 7-10 business days  Pt encouraged to return a call to [facility] RN CM prn   Teena Mangus L. Lavina Hamman, RN, BSN, [place] Coordinator Office number [phone] Mobile number [phone]  Main THN number [phone] Fax number [phone]

## 2019-12-01 ENCOUNTER — Ambulatory Visit: Payer: Self-pay | Admitting: *Deleted

## 2019-12-02 ENCOUNTER — Other Ambulatory Visit: Payer: Self-pay | Admitting: *Deleted

## 2019-12-02 NOTE — Patient Outreach (Signed)
[facility]) Care Management  12/02/2019  Danika Kluender 21-Mar-1941 [id number]   [place] coordination collaboration with endocrinology  Message from [place] staff at endocrinology office [facility] RN CM returned a call  Message left  [facility] RN CM will follow up with Mrs Stines within the next 7-14 business days   [place] L. Lavina Hamman, RN, BSN, [place] Coordinator Office number [phone] Mobile number [phone]  Main THN number [phone] Fax number [phone]

## 2019-12-02 NOTE — Patient Outreach (Signed)
[place] [facility]) Care Management  12/02/2019  Alexis Farmer 03/21/1941 [id number]   [place] coordination forMDreferred patient-collaboration with Endocrinology (Nutrition)  Referral Date:11/17/19 Referral Source:[place] Referral Reason:referral request from [name]:  Please assign [name] below to a RN for uncontrolled Diabetes, A1C of 9.9.  She also needs SW for possible housing and food resources. We can set up Mom's meals with a diabetic meal plan for 2 weeks.  She has a Software engineer only (Catie) at [employer] station=non-embedded for RN/SW.  Insurance:Medicare, Cigna   Outreach to Dr Honor Junes (endocrinology) office  Spoke with Caryl Pina who took and sent a message to inquire of the nutritional/dietitian resources possibly available at this office or if not available if an individulized nutritional/dietitian  referral could be initiated for [name] [facility] RN CM left HIPAA ([place] and Accountability Act) compliant voicemail message along with CM's contact info.   [facility] RN CM will follow up with [name] within the next 7-14 business days   [place] L. Lavina Hamman, RN, BSN, [place] Coordinator Office number [phone] Mobile number [phone]  Main THN number [phone] Fax number [phone]

## 2019-12-07 ENCOUNTER — Other Ambulatory Visit: Payer: Self-pay | Admitting: *Deleted

## 2019-12-07 ENCOUNTER — Other Ambulatory Visit: Payer: Self-pay

## 2019-12-07 NOTE — Patient Outreach (Signed)
[facility]) Care Management  12/07/2019  Alexis Farmer [birth year] [id number]   Alexis Farmer Telephone Screen for MD referral  Referral Date: 11/17/19 Referral Source: Alexis Farmer  Referral Reason: referral request from [place]:  Please assign [name] below to a RN for uncontrolled Diabetes, A1C of 9.9.  She also needs SW for possible housing and food resources. We can set up Mom's meals with a diabetic meal plan for 2 weeks.  She has a Software engineer only (Catie) at [employer] station=non-embedded for RN/SW.   Insurance: Ivery Quale   [facility] RN CM received a message from this patients' [place] staff on 11/19/19 Patient's granddaughter, Alexis Farmer, works at [employer] indicated pt did not want to discuss her medications, as she was immediately on the defensive about not wanting to change things. However, very worried about hypoglycemia. Pharmacist tried to reiterate that she could be assisted to get on a regimen with a lower risk of hypoglycemia, but she was not interested. Patient did agree to talk to Catie after her endo appointment next month, but she's pretty much planning on not following with him long term.   Care Guide provided some housing options to [place] Farmer and her mom, so they are working on investigating those.  THN SW also updated [facility] RN CM on 11/26/19 that a call attempt was made to patient   Alexis Farmer to this [facility] RN CM  Patient is able to verify HIPAA ([place] and Accountability Act) identifiers, date of birth (DOB) and address Today Alexis Farmer was informed [facility] RN CM would provide an update to her on upcoming nutritional services, and assess her daily meal and medication routine to assist with attempting to identifying more care coordination needs  Initially she question how she could be assisted by [facility] RN CM but agrees to outreach after [facility] RN CM discussed assisting her with [facility] screening and  identifying her needs She confirms she does need help with managing her diabetes   Diabetes Nutrition - [facility] RN CM discussed the collaboration with Janett Billow at her endocrinology office to get a referral on last week that was sent to the [facility] for nutritional services She voiced understanding. She was encouraged to return a call to [place] CM if she dose not have an outreach call from the [facility] staff within 7-10 business days Diet Alexis Farmer reports this morning she had toast and eggs She reports not liking and not purchasing various foods to include cold cuts, all types of sodas, infused water, raisins, honey. hard candies to prevent her from ingesting them excessively. She drinks water and unsweet tea  THN RN CM and Alexis Urda discussed the diabetic exchange system briefly  She reports not understanding the exchange system " I never knew what to eat" She reports she place herself on a diet but is not able to very a possible name or type of diet She confirms she previously weighed 280 lbs and is now 258 lbs ( a weight loss of 22 lbs)  [facility] RN CM commended her for her hard work   High cbg value  She reports a high of 300 in the past week  She reports she believes this was related to stress   CBG low value Alexis Farmer report a low value of 69-70 in the last 7 days She states most of her lows are when she goes to bed to sleep late in the night and this frightens her as she relates this  to her deceased mother who experienced a diabetic coma She reports this is part of the reason she has insomnia. The other reason is she previously worked night shifts ( 12-8 pm, etc)  She shares one of her female friend's experience with taking sleeping pill and not waking up Alexis Farmer is not preferring to take any sleeping pills for her insomnia She informs [facility] RN CM that if her cbg value is lower than 130 she notes numbness in her hands She reports being out of glucose tablets. She and  [facility] RN CM discussed use of orange juice, sugar, hard candies, honey, regula sodas, raisins, courn syrup, and saltine crackers.   THN RN CM discussed general reasons cbg values become low to include poor sleep/fatigue, alcohol use, poor meal planning, certain medicines (like beta blockers, quinidine, indomethacin, and excess insulin/oral diabetic agents   This lead to identifying that Alexis Farmer is administering a maintenance insulin (Novolin N 20 units am 15 units pm) twice a day with meals, in the morning and at night plus NOT following the listed sliding scale in Epic but administering "fifteen units of insulin"  With lots of repeating what she informed [facility] RN CM, She confirms she take large amounts of insulin upon arising in the morning around 7 or 8 am and in the evening around 5 pm. She reports she does not believe her maintenance insulin,"long acting" does not work to "keep my blood sugar low"  She agrees to following the sliding scale at least one day prior to the next outreach   Sent EMMI materials for diabetic meal planning, low blood sugar in people with diabetes, adult heart failure, chronic kidney disease, high blood pressure (hypertension): what you can do and peripheral vascular (arterial) disease discharge instructions    Social Alexis Farmer is a  79 year old widowed female who has support of her family She reports having a large family of lots of a daughter, granddaughter  and lots of grandchildren and great grandchildren. One of her grand daughters (age 56) id paid to assist her to bring in her groceries after she purchases them.  She reports to [facility] RN CM that she married young. She is from [place].  Her husband passed "eight" years ago. She reports he drank etoh. He reports she still have people calling her wanting to speak with him.  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, abdominal aortic atherosclerosis, GI bleed, rectal bleed, cervical spondylosis with radiculopathy, osteoarthritis, arthritis, disorder of right rotator cuff,  Pedal edema, morbid obesity on  11/10/19 weight =258 lbs Height=5'3" BMI =45.86, anxiety, depression , vertigo, chronic fatigue, bradycardia, constipation, falls hypercalcemia, retinal hole of right eye, postmenopausal bleeding, diverticulosis, history (hx) of blood transfusion, arthritis, 07/21/19 had covid vaccine,  never smoker  Never intake of alcohol   Fall recent x 1- [facility] saw 03/29/19 closed fx lateral malleolus right fibular broken ankle    DME walker cbg monitor (accu chek  guide To check 3 times a day) dentures  Plan: [facility] RN CM will  outreach to patient again within the next 7-10 business days  Pt encouraged to return a call to [facility] RN CM prn  Routed note to MD  [place] Problem One     Most Recent Value  Care Plan Problem One knowledge deficit of home care for Diabetes  Role Documenting the Problem One Care Management Telephonic Coordinator  Care Plan for Problem One Active  THN Long Term Goal  over the next 90 days patient will be able to verbalize with outreach interventions to manage Diabetes  THN Long Term Goal Start Date 11/26/19  Interventions for Problem One Long Term Goal Further assessment of home DM care r/t diet, cbg lows & highs , what causes low cbg values, DM exchange system, how to resolve low cbg values, reviewed how she is taking her daily insulin to idenitfy excessive administration of insulin, not  following recommended sliding scale diabetic meal planning, low blood sugar in people with diabetes, adult heart failure, chronic kidney disease, high blood pressure (hypertension): what you can do peripheral vascular (arterial) disease discharge instructions   THN CM Short Term Goal #1  Patient will be able to verbalize in the next 30 days 3 signs & symptoms to reprot to MD  [facility] CM Short Term Goal #1 Start Date 11/26/19  Interventions for Short Term Goal #1 Further assessment of home DM care r/t diet, cbg lows & highs , what causes low cbg values, DM exchange system, how to resolve low cbg values, reviewed how she is taking her daily insulin to idenitfy excessive administration of insulin, not following recommended sliding scale   THN CM Short Term Goal #2  over the next 39 days patient will verbalize following recommended sliding scale insulin  THN CM Short Term Goal #2 Start Date 12/07/19  Interventions for Short Term Goal #2 Further assessment of home DM care r/t diet, cbg lows & highs , what causes low cbg values, DM exchange system, how to resolve low cbg values, reviewed how she is taking her daily insulin to idenitfy excessive administration of insulin, not following recommended sliding scale       Ahana Najera L. Lavina Hamman, RN, BSN, [place] Coordinator Office number [phone] Mobile number [phone]  Main THN number [phone] Fax number [phone]

## 2019-12-10 ENCOUNTER — Encounter: Payer: Self-pay | Admitting: *Deleted

## 2019-12-14 ENCOUNTER — Other Ambulatory Visit: Payer: Self-pay | Admitting: *Deleted

## 2019-12-14 ENCOUNTER — Other Ambulatory Visit: Payer: Self-pay

## 2019-12-14 NOTE — Patient Outreach (Signed)
[facility]) Care Management  12/14/2019  Alexis Farmer 28-Feb-1941 [id number]   [facility] Telephone Screen forMDreferral  Referral Date:11/17/19 Referral Source:[place] Referral Reason:referral request from [name]:  Please assign Alexis Farmer below to a RN for uncontrolled Diabetes, A1C of 9.9.  She also needs SW for possible housing and food resources. We can set up Mom's meals with a diabetic meal plan for 2 weeks.  She has a Software engineer only (Catie) at [employer] station=non-embedded for RN/SW.  Insurance:Medicare, Christella Scheuermann   [facility] RN CM received a message from this patients' [place] staff on 11/19/19 Patient's granddaughter, Alexis Farmer, works at [employer] indicated pt did not want to discussher medications, as she was immediately on the defensive about not wanting to change things. However, very worried about hypoglycemia.Pharmacisttried to reiterate thatshecouldbe assisted to geton a regimen with a lower risk of hypoglycemia, but she was not interested. Patient didagree to talk to [place] her endo appointment next month, but she's pretty much planning on not following with him long term.   Care Guide provided some housing options to [place] and her mom, so they are working on investigating those.  THN SW also updated [facility] RN CM on 11/26/19 that a call attempt was made to patient   Alexis Benjaminoutreachedto this [facility] RN CM Patient is able to verify HIPAA ([place] and Accountability Act) identifiers, date of birth (DOB) and address Today Alexis Farmer was informed [facility] RN CM would provide an update to her on upcoming nutritional services, and assess her daily meal and medication routine to assist with attempting to identifying more care coordination needs Initially she question how she could be assisted by [facility] RN CM but agrees to outreach after [facility] RN CM discussed assisting her with [facility] screening and  identifying her needs She confirms she does need help with managing her diabetes   Diabetes Nutrition - [facility] RN CM discussed the collaboration with Alexis Farmer at her endocrinology office to get a referral on last week that was sent to the Lifestyle center for nutritional services She voiced understanding. She was encouraged to return a call to [place] CM if she dose not have an outreach call from the Lifestyle center staff within 7-10 business days Diet Alexis Farmer reports this morning she had toast and eggs She reports not liking and not purchasing various foods to include cold cuts, all types of sodas, infused water, raisins, honey. hard candies to prevent her from ingesting them excessively. She drinks water and unsweet tea  THN RN CM and Alexis Farmer discussed the diabetic exchange system briefly  She reports not understanding the exchange system " I never knew what to eat" She reports she place herself on a diet but is not able to very a possible name or type of diet She confirms she previously weighed 280 lbs and is now 258 lbs ( a weight loss of 22 lbs)  [facility] RN CM commended her for her hard work  High cbg value  She reports a high of 300 in the past week  She reports she believes this was related to stress  CBG low value Alexis Farmer report a low value of 69-70 in the last 7 days She states most of her lows are when she goes to bed to sleep late in the night and this frightens her as she relates this to her deceased mother who experienced a diabetic coma She reports this is part of the reason she has insomnia. The other reason is  she previously worked night shifts ( 12-8 pm, etc)  She shares one of her female friend's experience with taking sleeping pill and not waking up Alexis Farmer is not preferring to take any sleeping pills for her insomnia She informs [place] RN CM that if her cbg value is lower than 130 she notes numbness in her hands She reports being out of glucose tablets. She and  [facility] RN CM discussed use of orange juice, sugar, hard candies, honey, regula sodas, raisins, courn syrup, and saltine crackers. THN RN CM discussed general reasons cbg values become low to include poor sleep/fatigue, alcohol use, poor meal planning, certain medicines (like beta blockers, quinidine, indomethacin, and excess insulin/oral diabetic agents  This lead to identifying that Alexis Farmer is administering a maintenance insulin (Novolin N 20 units am 15 units pm) twice a day with meals, in the morning and at night plus NOT following the listed sliding scale in Epic but administering "fifteen units of insulin" With lots of repeating what she informed [facility] RN CM,She confirms she take large amounts of insulin upon arising in the morning around 7 or 8 am and in the evening around 5 pm. She reports she does not believe her maintenance insulin,"long acting" does not work to "keep my blood sugar low"  She agrees to following the sliding scale at least one day prior to the next outreach   Sent EMMI materials for diabetic meal planning, low blood sugar in people with diabetes, adult heart failure, chronic kidney disease, high blood pressure (hypertension): what you can do and peripheral vascular (arterial) disease discharge instructions    Social Alexis Farmer is a 79 year old widowed female who has support of her family She reports having a large family of lots of a daughter, granddaughter and lots of grandchildren and great grandchildren. One of her grand daughters (age 23) id paid to assist her to bring in her groceries after she purchases them. She reports toTHN RN CMthat she married young. She is from [place].Her husband passed "eight" years ago. She reports he drank etoh. He reports she still have people calling her wanting to speak with him.  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, abdominal aortic atherosclerosis, GI bleed, rectal bleed, cervical spondylosis with radiculopathy, osteoarthritis, arthritis, disorder of right rotator cuff, Pedal edema, morbid obesity on 11/10/19 weight =258 lbs Height=5'3" BMI =45.86, anxiety, depression , vertigo, chronic fatigue, bradycardia, constipation, falls hypercalcemia, retinal hole of right eye, postmenopausal bleeding, diverticulosis, history (hx)of blood transfusion, arthritis, 07/21/19 had covid vaccine, never smoker Never intake of alcohol Fall recent x 1-[facility] saw 03/29/19 closed fx lateral malleolus right fibular broken ankle  DME walker cbg monitor (accu chek guide To check 3 times a day)dentures   Plan: THN RN CM willoutreach to patient again within the next 7-10 business days  Pt encouraged to return a call to [facility] RN CM prn  Routed note to MD  [facility] CM Care Plan Problem One     Most Recent Value  Care Plan Problem One knowledge deficit of  home care for Diabetes  Role Documenting the Problem One Care Management Telephonic [place] for Problem One Active  THN Long Term Goal  over the next 90 days patient will be able to verbalize with outreach interventions to manage Diabetes  THN Long Term Goal Start Date 11/26/19  Interventions for Problem One Long Term Goal further assessment/follow up on home disease management  THN CM Short Term Goal #1  Patient will be able to verbalize in the next 30 days 3 signs & symptoms to report to MD  [facility] CM Short Term Goal #1 Start Date 11/26/19  Interventions for  Short Term Goal #1 further asssessment of concerns and possible barriers  THN CM Short Term Goal #2  over the next 39 days patient will verbalize following recommended sliding scale insulin  THN CM Short Term Goal #2 Start Date 12/07/19  Interventions for Short Term Goal #2 discussed present home insulin administration, identified and had pt acknowledge her elevated use of insulin doses above what is recommended per her sliding scale       Kalysta Kneisley L. Lavina Hamman, RN, BSN, [place] Coordinator Office number [phone] Mobile number [phone]  Main THN number [phone] Fax number [phone]

## 2019-12-22 ENCOUNTER — Other Ambulatory Visit: Payer: Self-pay

## 2019-12-22 ENCOUNTER — Other Ambulatory Visit: Payer: Self-pay | Admitting: *Deleted

## 2019-12-22 DIAGNOSIS — R809 Proteinuria, unspecified: Secondary | ICD-10-CM | POA: Insufficient documentation

## 2019-12-22 DIAGNOSIS — R609 Edema, unspecified: Secondary | ICD-10-CM

## 2019-12-22 DIAGNOSIS — R600 Localized edema: Secondary | ICD-10-CM | POA: Insufficient documentation

## 2019-12-22 HISTORY — DX: Edema, unspecified: R60.9

## 2019-12-22 HISTORY — DX: Localized edema: R60.0

## 2019-12-23 ENCOUNTER — Telehealth: Payer: Self-pay | Admitting: Internal Medicine

## 2019-12-23 NOTE — Telephone Encounter (Signed)
Pt called and states that left elbow is in pain again and does not want appt. Just wants to know if PCP will call in ointment like she did last time. Please advise

## 2019-12-23 NOTE — Telephone Encounter (Signed)
Please advise 

## 2019-12-24 ENCOUNTER — Other Ambulatory Visit: Payer: Self-pay | Admitting: Internal Medicine

## 2019-12-24 DIAGNOSIS — L0291 Cutaneous abscess, unspecified: Secondary | ICD-10-CM

## 2019-12-24 DIAGNOSIS — M25522 Pain in left elbow: Secondary | ICD-10-CM

## 2019-12-24 DIAGNOSIS — M25521 Pain in right elbow: Secondary | ICD-10-CM

## 2019-12-24 MED ORDER — DICLOFENAC SODIUM 1 % EX GEL: 2.0000 g | Freq: Four times a day (QID) | CUTANEOUS | 0 refills | Status: DC

## 2019-12-24 MED ORDER — MUPIROCIN 2 % EX OINT: 1.0000 "application " | TOPICAL_OINTMENT | Freq: Two times a day (BID) | CUTANEOUS | 2 refills | Status: DC

## 2019-12-28 ENCOUNTER — Other Ambulatory Visit: Payer: Self-pay | Admitting: *Deleted

## 2019-12-28 NOTE — Patient Outreach (Signed)
[facility]) Care Management  12/28/2019  Alexis Farmer Oct 10, 1940 [id number]   CSW made contact with pt today who report she still does not feel comfortable with the meal delivery due to the [place] pandemic.  Pt provided with CSW contact info and agrees to [place] signing off- pt to call if needs arise or interest in meal delivery changes.  CSW will advise PCP and [facility] team of above.   Eduard Clos, MSW, [place] Worker  [place] [phone]

## 2019-12-30 ENCOUNTER — Ambulatory Visit: Payer: Medicare Other | Admitting: Podiatry

## 2020-01-04 ENCOUNTER — Other Ambulatory Visit: Payer: Self-pay | Admitting: *Deleted

## 2020-01-04 ENCOUNTER — Ambulatory Visit: Payer: Self-pay | Admitting: Pharmacist

## 2020-01-04 ENCOUNTER — Other Ambulatory Visit: Payer: Self-pay

## 2020-01-04 DIAGNOSIS — E1165 Type 2 diabetes mellitus with hyperglycemia: Secondary | ICD-10-CM

## 2020-01-04 DIAGNOSIS — I10 Essential (primary) hypertension: Secondary | ICD-10-CM

## 2020-01-04 NOTE — Chronic Care Management (AMB) (Signed)
Chronic Care Management   Follow Up Note   01/04/2020 Name: Alexis Farmer MRN: [id number] DOB: Mar 02, 1941  Referred by: McLean-Scocuzza, Nino Glow, MD Reason for referral : Chronic Care Management (Medication Management)   Alexis Farmer is a 79 y.o. year old female who is a primary care patient of McLean-Scocuzza, Nino Glow, MD. The CCM team was consulted for assistance with chronic disease management and care coordination needs.    Care coordination completed today.   Review of patient status, including review of consultants reports, relevant laboratory and other test results, and collaboration with appropriate care team members and the patient's provider was performed as part of comprehensive patient evaluation and provision of chronic care management services.    SDOH (Social Determinants of Health) assessments performed: Yes See Care Plan activities for detailed interventions related to [facility])     Outpatient Encounter Medications as of 01/04/2020  Medication Sig Note  . amLODipine (NORVASC) 5 MG tablet Take 1 tablet (5 mg total) by mouth daily.   . Ascorbic Acid (VITAMIN C PO) Take 500 mg by mouth daily.   . blood glucose meter kit and supplies KIT Accu chek, Dx code E11.65, check 3 times daily   . cloNIDine (CATAPRES) 0.2 MG tablet Take 1 tablet (0.2 mg total) by mouth 3 (three) times daily.   . diclofenac Sodium (VOLTAREN) 1 % GEL Apply 2 g topically 4 (four) times daily. Upper body and 4 gram qid prn lower body arthritis pain/musculoskeletal pain this is also OTC   . ezetimibe (ZETIA) 10 MG tablet Take 1 tablet (10 mg total) by mouth daily.   Marland Kitchen glucose 4 GM chewable tablet Chew 1 tablet (4 g total) by mouth as needed for low blood sugar. <70   . glucose blood test strip Use as instructed tid  Accuchek Guide   . insulin NPH Human (NOVOLIN N) 100 UNIT/ML injection INJECT 20 UNITS IN THE MORNING AND 20 UNITS IN THE EVENING WITH A MEAL (Patient taking differently: INJECT 20 UNITS IN  THE MORNING AND 20 UNITS IN THE EVENING WITH A MEAL (pt actually taking 20 qam and 15 units qpm))   . insulin regular (NOVOLIN R) 100 units/mL injection 15 minutes before meals tid. 131-180 2 units 181 to 240 4 units 241-300 6units 301-350 8 units 351-400 10 units>400 12 units.  Keep glucose tablets for sugar < 70 12/10/2019: Not taking on a sliding scale as ordered  . Insulin Syringe-Needle U-100 (B-D INS SYR ULTRAFINE .5CC/30G) 30G X 1/2" 0.5 ML MISC 1 Device by Does not apply route daily. Up to 5x per day with insulin bid (NPH) and tid (Regular)   . meclizine (ANTIVERT) 12.5 MG tablet Take 1 tablet (12.5 mg total) by mouth daily as needed for dizziness. (Patient not taking: Reported on 11/10/2019)   . Multiple Vitamin (MULTIVITAMIN WITH MINERALS) TABS tablet Take 1 tablet by mouth daily.   . mupirocin ointment (BACTROBAN) 2 % Apply 1 application topically 2 (two) times daily. Right index finger and left forearm   . polyethylene glycol powder (GLYCOLAX/MIRALAX) 17 GM/SCOOP powder Take 17 g by mouth daily as needed for moderate constipation or severe constipation. Can take up to 2x per day prn. Mix with 8 ounces of liquid   . progesterone (PROMETRIUM) 200 MG capsule Take by mouth.   . rosuvastatin (CRESTOR) 10 MG tablet TAKE 1 TABLET (10 MG TOTAL) BY MOUTH DAILY.   Marland Kitchen spironolactone (ALDACTONE) 25 MG tablet Take 1 tablet (25 mg total) by mouth daily.   Marland Kitchen  torsemide (DEMADEX) 10 MG tablet Take 10 mg by mouth daily as needed.    No facility-administered encounter medications on file as of 01/04/2020.     Objective:   Goals Addressed              This Visit's Progress     Patient Stated   .  PharmD "I don't want to change medications" (pt-stated)        CARE PLAN ENTRY (see longtitudinal plan of care for additional care plan information)  Current Barriers:  . Diabetes: uncontrolled; complicated by chronic medical conditions including HTN, CHF, PAD, CKD, most recent A1c 9.8% o Discussed  previous Care Guide referral with patient's granddaughter, Alexis Farmer. Alexis Farmer notes that when her computer was updated, she lost the email from the Care Guide with housing resources. She also would like to talk to the Care Guides about potential food delivery resources so that she can discuss this with her grandmother.  . Most recent eGFR: 57 mL/min . Current antihyperglycemic regimen: Novolin N 20 units QAM, 15 units QPM; Novoling R sliding scale . Reports issues w/ hypoglycemia . Cardiovascular risk reduction o Current hypertensive regimen: amlodipine 5 mg daily, clonidine 0.2 mg TID, spironolactone 25 mg daily, torsemide 10 mg daily  o Current hyperlipidemia regimen: ezetimibe 10 mg daily , rosuvastatin 10 mg daily   Pharmacist Clinical Goal(s):  Marland Kitchen Over the next 90 days, patient will work with PharmD and primary care provider to address optimized medication management  Interventions: . Placed Care Guide referral to re-sent housing resources to patient's granddaughter, and to discuss availability of food delivery resources that she could then discuss with patient   Patient Self Care Activities:  . Patient will take medications as prescribed . Patient will report any questions or concerns to provider   Please see past updates related to this goal by clicking on the "Past Updates" button in the selected goal          Plan:  - Will outreach as previously scheduled  Catie Darnelle Maffucci, PharmD, [place], [place] Pharmacist [place] [phone]

## 2020-01-04 NOTE — Patient Instructions (Signed)
Visit Information  Goals Addressed              This Visit's Progress     Patient Stated   .  PharmD "I don't want to change medications" (pt-stated)        CARE PLAN ENTRY (see longtitudinal plan of care for additional care plan information)  Current Barriers:  . Diabetes: uncontrolled; complicated by chronic medical conditions including HTN, CHF, PAD, CKD, most recent A1c 9.8% o Discussed previous Care Guide referral with patient's granddaughter, Elvina Mattes. Elvina Mattes notes that when her computer was updated, she lost the email from the Care Guide with housing resources. She also would like to talk to the Care Guides about potential food delivery resources so that she can discuss this with her grandmother.  . Most recent eGFR: 57 mL/min . Current antihyperglycemic regimen: Novolin N 20 units QAM, 15 units QPM; Novoling R sliding scale . Reports issues w/ hypoglycemia . Cardiovascular risk reduction o Current hypertensive regimen: amlodipine 5 mg daily, clonidine 0.2 mg TID, spironolactone 25 mg daily, torsemide 10 mg daily  o Current hyperlipidemia regimen: ezetimibe 10 mg daily , rosuvastatin 10 mg daily   Pharmacist Clinical Goal(s):  Marland Kitchen Over the next 90 days, patient will work with PharmD and primary care provider to address optimized medication management  Interventions: . Placed Care Guide referral to re-sent housing resources to patient's granddaughter, and to discuss availability of food delivery resources that she could then discuss with patient   Patient Self Care Activities:  . Patient will take medications as prescribed . Patient will report any questions or concerns to provider   Please see past updates related to this goal by clicking on the "Past Updates" button in the selected goal         The patient verbalized understanding of instructions provided today and declined a print copy of patient instruction materials.    Plan:  - Will outreach as previously  scheduled  Catie Darnelle Maffucci, PharmD, [place], [place] Pharmacist [place] [phone]

## 2020-01-04 NOTE — Patient Outreach (Signed)
[facility]) Care Management  01/04/2020  Alexis Farmer 10/30/1940 [id number]   [facility] outreach to complex care patient  Mrs Alexis Farmer was referred to [facility] on 11/17/19 Referral source [place] for uncontrolled Diabetes A1C of 9.9, SE for possible housing and food resources.  Insurance Medicare, [employer]  [facility] RN CM called [name] x 3 today Twice the telephone was noted to be answered and disconnected On the third outreach [name] briefly spoke with [facility] RN CM enough for [facility] RN CM to assess she was in pain again today  [name] on the last 2 outreaches has mentioned pain but would continue to engage with [facility] RN CM. On today [facility] RN CM heard her with various audible moaning sounds Previously [name] has refused [facility] CM RN offer to work with her on interventions for the pain barrier. She continued to state "there is nothing they can do"  Today she states she has only been assisted for her pain concerns by her primary care provider (PCP) and reports she has not been seen by a specialist. [place] RN CM left a message for the nurse of Dr Randal Buba and Velora Heckler pharmacist, Catie T [facility] RN CM spoke with Catie about the outreach to [name] today, her noted use of insulin, not following the recommended sliding scale leading to cbg lows, her refusal to accept [facility] SW services and outreach to her grand daughter [name] my number to be provided to her grand daughter   [facility] RN CM will follow up with [name] within the next 7-14 business days after continue to collaborate with her pcp staff related to the pain barrier   Joelene Millin L. Lavina Hamman, RN, BSN, [place] Coordinator Office number [phone] Mobile number [phone]  Main [facility] number [phone] Fax number [phone]'

## 2020-01-07 ENCOUNTER — Ambulatory Visit: Payer: Self-pay | Admitting: *Deleted

## 2020-01-07 ENCOUNTER — Ambulatory Visit (INDEPENDENT_AMBULATORY_CARE_PROVIDER_SITE_OTHER): Payer: Medicare Other | Admitting: Pharmacist

## 2020-01-07 DIAGNOSIS — M8949 Other hypertrophic osteoarthropathy, multiple sites: Secondary | ICD-10-CM

## 2020-01-07 DIAGNOSIS — I10 Essential (primary) hypertension: Secondary | ICD-10-CM | POA: Diagnosis not present

## 2020-01-07 DIAGNOSIS — M159 Polyosteoarthritis, unspecified: Secondary | ICD-10-CM

## 2020-01-07 DIAGNOSIS — M25521 Pain in right elbow: Secondary | ICD-10-CM

## 2020-01-07 DIAGNOSIS — E1165 Type 2 diabetes mellitus with hyperglycemia: Secondary | ICD-10-CM

## 2020-01-07 DIAGNOSIS — N1832 Chronic kidney disease, stage 3b: Secondary | ICD-10-CM

## 2020-01-07 NOTE — Chronic Care Management (AMB) (Signed)
Chronic Care Management   Follow Up Note   01/07/2020 Name: Alexis Farmer MRN: [id number] DOB: 1941/01/01  Referred by: McLean-Scocuzza, Nino Glow, MD Reason for referral : Chronic Care Management (Medication Management)   Alexis Farmer is a 79 y.o. year old female who is a primary care patient of McLean-Scocuzza, Nino Glow, MD. The CCM team was consulted for assistance with chronic disease management and care coordination needs.   Contacted patient for medication management review.   Review of patient status, including review of consultants reports, relevant laboratory and other test results, and collaboration with appropriate care team members and the patient's provider was performed as part of comprehensive patient evaluation and provision of chronic care management services.    SDOH (Social Determinants of Health) assessments performed: Yes See Care Plan activities for detailed interventions related to SDOH)  SDOH Interventions     Most Recent Value  SDOH Interventions  SDOH Interventions for the Following Domains [place] Insecurity Interventions Patient Refused, Other (Comment)  [patient declines food delivery programs at this time]  Stress Interventions Provide Counseling       Outpatient Encounter Medications as of 01/07/2020  Medication Sig Note   acetaminophen (TYLENOL) 500 MG tablet Take 500 mg by mouth every 6 (six) hours as needed.     diclofenac Sodium (VOLTAREN) 1 % GEL Apply 2 g topically 4 (four) times daily. Upper body and 4 gram qid prn lower body arthritis pain/musculoskeletal pain this is also OTC    mupirocin ointment (BACTROBAN) 2 % Apply 1 application topically 2 (two) times daily. Right index finger and left forearm    amLODipine (NORVASC) 5 MG tablet Take 1 tablet (5 mg total) by mouth daily.    Ascorbic Acid (VITAMIN C PO) Take 500 mg by mouth daily.    blood glucose meter kit and supplies KIT Accu chek, Dx code E11.65, check 3 times  daily    cloNIDine (CATAPRES) 0.2 MG tablet Take 1 tablet (0.2 mg total) by mouth 3 (three) times daily.    ezetimibe (ZETIA) 10 MG tablet Take 1 tablet (10 mg total) by mouth daily.    glucose 4 GM chewable tablet Chew 1 tablet (4 g total) by mouth as needed for low blood sugar. <70    glucose blood test strip Use as instructed tid  Accuchek Guide    insulin NPH Human (NOVOLIN N) 100 UNIT/ML injection INJECT 20 UNITS IN THE MORNING AND 20 UNITS IN THE EVENING WITH A MEAL (Patient taking differently: INJECT 20 UNITS IN THE MORNING AND 20 UNITS IN THE EVENING WITH A MEAL (pt actually taking 20 qam and 15 units qpm))    insulin regular (NOVOLIN R) 100 units/mL injection 15 minutes before meals tid. 131-180 2 units 181 to 240 4 units 241-300 6units 301-350 8 units 351-400 10 units>400 12 units.  Keep glucose tablets for sugar < 70 12/10/2019: Not taking on a sliding scale as ordered   Insulin Syringe-Needle U-100 (B-D INS SYR ULTRAFINE .5CC/30G) 30G X 1/2" 0.5 ML MISC 1 Device by Does not apply route daily. Up to 5x per day with insulin bid (NPH) and tid (Regular)    meclizine (ANTIVERT) 12.5 MG tablet Take 1 tablet (12.5 mg total) by mouth daily as needed for dizziness. (Patient not taking: Reported on 11/10/2019)    Multiple Vitamin (MULTIVITAMIN WITH MINERALS) TABS tablet Take 1 tablet by mouth daily.    polyethylene glycol powder (GLYCOLAX/MIRALAX) 17 GM/SCOOP powder Take 17 g by mouth daily  as needed for moderate constipation or severe constipation. Can take up to 2x per day prn. Mix with 8 ounces of liquid    progesterone (PROMETRIUM) 200 MG capsule Take by mouth.    rosuvastatin (CRESTOR) 10 MG tablet TAKE 1 TABLET (10 MG TOTAL) BY MOUTH DAILY.    spironolactone (ALDACTONE) 25 MG tablet Take 1 tablet (25 mg total) by mouth daily.    torsemide (DEMADEX) 10 MG tablet Take 10 mg by mouth daily as needed.    No facility-administered encounter medications on file as of 01/07/2020.      Objective:   Goals Addressed              This Visit's Progress     Patient Stated     PharmD "I don't want to change medications" (pt-stated)        CARE PLAN ENTRY (see longtitudinal plan of care for additional care plan information)  Current Barriers:   Social, financial, community barriers:  o Spoke with patient's granddaughter, Alexis Farmer. Patient declines meal delivery programs, as she doesn't want to eat anyone else's cooking. Alexis Farmer reached out to Care Guide to ask her to re-email list of housing options for Alexis Farmer to investigate on behalf of patient  Most recent eGFR: 57 mL/min  Diabetes: uncontrolled; complicated by chronic medical conditions including HTN, CHF, PAD, CKD, most recent A1c 9.8%   Current antihyperglycemic regimen: Novolin N 20 units QAM, 15 units QPM; Novoling R sliding scale  Cardiovascular risk reduction: CHF/PAD o Current hypertensive regimen: amlodipine 5 mg daily, clonidine 0.2 mg TID, spironolactone 25 mg daily, torsemide 10 mg daily  o Current hyperlipidemia regimen: ezetimibe 10 mg daily , rosuvastatin 10 mg daily; LDL at goal <70  Chronic arthritis pain: patient reports a long history of significant pain. Rpeorts she is "always" in pain, and will lay in pain and cry or pray. Previously saw orthopedics, but noted that PT did not help and worsened pain. Negative for indications of rheumatoid arthritis. Currently taking APAP 500 mg up to QID, using diclofenac 1% gel PRN, though does not feel that either option provide significant benefit   Pharmacist Clinical Goal(s):   Over the next 90 days, patient will work with PharmD and primary care provider to address optimized medication management  Interventions:  Pain management medication review performed, medication list updated in electronic medical record  Inter-disciplinary care team collaboration (see longitudinal plan of care)  Patient amenable to discussing pain today. Discussed max  daily dose of APAP can be up to 4 g daily, provided reassurance that she could take 1-2 tabs of APAP 500 mg 3-4 times daily and not cause liver damage. Agreed with her to avoid NSAIDs, such as naproxen, ibuprofen. Discussed that topical diclofenac does not have significant systemic absorption as would cause concern.   Though patient not interested in new medication at this time, we did discuss duloxetine. I explained that it is technically an antidepressant, but can provide benefit in chronic pain, and does not have the addictive or sedative concerns of narcotics. Encouraged patient to let our team know if she is ever interested in trying something new. Provided reassurance that duloxetine would be a safe option in her DM/HF/CKD  Updated patient that RN CM is to call this afternoon. Will collaborate w/ RN CM   Will let Care Guide know to cancel referral for meal deliver  Patient Self Care Activities:   Patient will take medications as prescribed  Patient will report any questions or concerns  to provider   Please see past updates related to this goal by clicking on the "Past Updates" button in the selected goal          Plan:  -Scheduled f/u call in ~ 6 weeks  Catie Darnelle Maffucci, PharmD, [place], [place] Pharmacist [place] [phone]

## 2020-01-07 NOTE — Patient Instructions (Addendum)
Alexis Farmer,   It was great talking to you today!  We talked a bit about your pain today. Remember, you can use up to 4000 mg of Tylenol daily. This would be up to 8 tablets of Tylenol 500 mg.   If you ever decide you want to try something new, I think duloxetine would be a great option. It was originally used as an antidepressant, but can provide some benefit in chronic pain like yours, even if we are not trying to specifically treat depression. There would be no harm for your heart or kidneys, nor would it worsen any of your other conditions.   Please feel free to give me a call if you have any questions about the above.   I look forward to talking to you in September! Do give some consideration to whatever Dr. Honor Junes suggests. I am sure that he is going to suggest a different insulin regimen. There really are some other options that we can use that WILL NOT cause low blood sugars like your current insulin regimen does, and allow [place] to help you better control your blood sugars. Our goal is for you to NOT have to worry about your sugars, whether low sugars or high sugars! You also do not have to worry about cost - I work with most of our patients here at [facility], and many patients that also see Dr. Honor Junes, with ways to better afford costly medications. The insulin manufacturers have programs where we can apply for you to get your medication for free. Please call me if you want to talk about this before or after you see Dr. Honor Junes.   Take care!  Catie Darnelle Maffucci, PharmD, [place], CPP Clinical Pharmacist [place] [phone]   Visit Information  Goals Addressed              This Visit's Progress     Patient Stated   .  PharmD "I don't want to change medications" (pt-stated)        CARE PLAN ENTRY (see longtitudinal plan of care for additional care plan information)  Current Barriers:  . Social, financial, community  barriers:  o Spoke with patient's granddaughter, Elvina Mattes. Patient declines meal delivery programs, as she doesn't want to eat anyone else's cooking. Alexis Farmer reached out to Care Guide to ask her to re-email list of housing options for Alexis Farmer to investigate on behalf of patient . Most recent eGFR: 57 mL/min . Diabetes: uncontrolled; complicated by chronic medical conditions including HTN, CHF, PAD, CKD, most recent A1c 9.8%  . Current antihyperglycemic regimen: Novolin N 20 units QAM, 15 units QPM; Novoling R sliding scale . Cardiovascular risk reduction: CHF/PAD o Current hypertensive regimen: amlodipine 5 mg daily, clonidine 0.2 mg TID, spironolactone 25 mg daily, torsemide 10 mg daily  o Current hyperlipidemia regimen: ezetimibe 10 mg daily , rosuvastatin 10 mg daily; LDL at goal <70 . Chronic arthritis pain: patient reports a long history of significant pain. Rpeorts she is "always" in pain, and will lay in pain and cry or pray. Previously saw orthopedics, but noted that PT did not help and worsened pain. Negative for indications of rheumatoid arthritis. Currently taking APAP 500 mg up to QID, using diclofenac 1% gel PRN, though does not feel that either option provide significant benefit   Pharmacist Clinical Goal(s):  Marland Kitchen Over the next 90 days, patient will work with PharmD and primary care provider to address optimized medication management  Interventions: . Pain management medication review  performed, medication list updated in electronic medical record . Inter-disciplinary care team collaboration (see longitudinal plan of care) . Patient amenable to discussing pain today. Discussed max daily dose of APAP can be up to 4 g daily, provided reassurance that she could take 1-2 tabs of APAP 500 mg 3-4 times daily and not cause liver damage. Agreed with her to avoid NSAIDs, such as naproxen, ibuprofen. Discussed that topical diclofenac does not have significant systemic absorption as would cause  concern.  . Though patient not interested in new medication at this time, we did discuss duloxetine. I explained that it is technically an antidepressant, but can provide benefit in chronic pain, and does not have the addictive or sedative concerns of narcotics. Encouraged patient to let our team know if she is ever interested in trying something new. Provided reassurance that duloxetine would be a safe option in her DM/HF/CKD . Updated patient that RN CM is to call this afternoon. Will collaborate w/ RN CM  . Will let Care Guide know to cancel referral for meal deliver  Patient Self Care Activities:  . Patient will take medications as prescribed . Patient will report any questions or concerns to provider   Please see past updates related to this goal by clicking on the "Past Updates" button in the selected goal         The patient verbalized understanding of instructions provided today and agreed to receive a mailed copy of patient instruction and/or educational materials.     Plan:  -Scheduled f/u call in ~ 6 weeks  Catie Darnelle Maffucci, PharmD, [place], [place] Pharmacist [place] [phone]

## 2020-01-18 ENCOUNTER — Other Ambulatory Visit: Payer: Self-pay | Admitting: *Deleted

## 2020-01-18 ENCOUNTER — Other Ambulatory Visit: Payer: Self-pay

## 2020-01-18 ENCOUNTER — Encounter: Payer: Medicare Other | Attending: Internal Medicine | Admitting: Dietician

## 2020-01-18 ENCOUNTER — Telehealth: Payer: Self-pay | Admitting: Internal Medicine

## 2020-01-18 ENCOUNTER — Encounter: Payer: Self-pay | Admitting: Dietician

## 2020-01-18 VITALS — Ht 63.0 in | Wt 260.0 lb

## 2020-01-18 DIAGNOSIS — E119 Type 2 diabetes mellitus without complications: Secondary | ICD-10-CM | POA: Diagnosis present

## 2020-01-18 DIAGNOSIS — IMO0002 Reserved for concepts with insufficient information to code with codable children: Secondary | ICD-10-CM

## 2020-01-18 DIAGNOSIS — N183 Chronic kidney disease, stage 3 unspecified: Secondary | ICD-10-CM

## 2020-01-18 DIAGNOSIS — E1122 Type 2 diabetes mellitus with diabetic chronic kidney disease: Secondary | ICD-10-CM

## 2020-01-18 MED ORDER — SPIRONOLACTONE 25 MG PO TABS: 25.0000 mg | ORAL_TABLET | Freq: Every day | ORAL | 3 refills | Status: DC

## 2020-01-18 NOTE — Progress Notes (Signed)
Medical Nutrition Therapy: Visit start time: 7322  end time: 1200  Assessment:  Diagnosis: type 2 diabetes  Past medical history: HTN, high cholesterol, Stage IV CKD, cholecystectomy  Psychosocial issues/ stress concerns: pt did not rate stress level and feels "ok" about her stress management  Preferred learning method:  . Auditory . Visual . Hands-on  Current weight: 260.0 lbs  Height: 5'3" Medications, supplements: reconciled in medical reocrd   Progress and evaluation:   Pt attended visit with her case worker, Kim  Case worker states pt allow deals with diverticulosis flare ups   Pt avoids nuts, seeds, and fruit with lots of seeds due to inflammation in the bowel  Pt rates pain in office 10/10 and reports she does not feel confident in her ability to move around her home safely  Pt states low energy level and intense, chronic pain make preparing meals difficult; pt prefers not to cook   A1c-  9.8 09/21/19  288 lbs about a year ago. Pt made the several changes   Not eating bread, cut out sugar, soda, juice, fried foods   Switched to coke zero  Pt states she declined receiving food/meals from food delivery service  Pt reports that dentures fit poorly so she does not wear them  Eats foods with softer texture, easier to chew  Pt states she does not like to go to bed with her BG below 200   Physical activity: ADLs  Dietary Intake:  Usual eating pattern includes 1-2 meals and 0-1 snacks per day. Dining out frequency: 0 meals per week.  Breakfast: peach flavored instant oatmeal OR skips Lunch: Ensure original OR skips Supper: Ensure; 5-10 peanut butter with crackers  Snack: 5-10 peanut butter with crackers  Beverages: 1.5 Gallon water, pt states MD recommended lowering her water intake    Nutrition Care Education: Basic nutrition: basic food groups, appropriate nutrient balance, appropriate meal and snack schedule, general nutrition guidelines    Weight control:  importance of low sugar and low fat choices, portion control  Advanced nutrition:  food label reading Diabetes:  goals for BGs (further review needed at follow up), appropriate meal and snack schedule, appropriate carb intake and balance, healthy carb choices, role of fiber, protein, fat Hypertension:  importance of controlling BP, identifying high sodium foods Hyperlipidemia:  healthy and unhealthy fats Other lifestyle changes:  benefits of making changes, increasing motivation, readiness for change, identifying habits that need to change  Nutritional Diagnosis:  NB-1.6 Limited adherence to nutrition-related recommendations As related to controlled type 2 diabetes.  As evidenced by A1c of 9.8 on 09/21/2019, per pt report of BG and diet recall, per MD progress notes . [place]-2.2 Altered nutrition-related laboratory As related to uncontrolled type 2 diabetes .  As evidenced by A1c of 9.8 09/21/2019.  Intervention:  Pt states many days she only eats one meal a day due to pain and low energy.  Energy intake may not be meeting needs for protein, carbs, or protein.  Pt is taking multiple multivitamins a day that provide 2-5 times the DRI for certain nutrients.  Advised against taking both multi-vitamin supplements each day, K and Phos concentration may contraindicate kidney function.  Discussed recommendations and small changes to diet.  Pt indicated many things that she could not do and would not do.  Pt is in contemplative phase of change.  At follow up, would like to help pt recognize what she can gain from habit changes instead of focusing on what she is losing.  Education Materials given:  . NCM Heart Healthy and Carb Consistent MNT handout . General diet guidelines for Heart Health . Plate Planner with foods lists . Sample meal pattern/ menus . Goals/ instructions  Learner/ who was taught:  . Patient  . Case worker- Kim  Level of understanding: . Partial understanding; needs review/ practice o Pt  would benefit from review of BG control and target ranges   Demonstrated degree of understanding via:   Teach back Learning barriers: . None  Willingness to learn/ readiness for change: . Hesitance, contemplating change  Monitoring and Evaluation:  Dietary intake, exercise, BGs and A1c, and body weight      follow up: recommend 4-6 weeks

## 2020-01-18 NOTE — Telephone Encounter (Signed)
Pt needs spironolactone refilled. [facility] and [facility] told her she needs to [facility] us.

## 2020-01-18 NOTE — Patient Instructions (Addendum)
   Take one multivitamin a day- Vitamin B Complex or Women's One-A-Day  Try to portion control your carbs   2-3 servings/meals   1-2 servings/snacks   Try to each a vegetable at lunch and dinner  For snacks, Carb + protein

## 2020-01-20 NOTE — Patient Outreach (Signed)
[place] [facility]) Care Management  01/20/2020  Alexis Farmer 1941/01/29 [id number]   Late entry for 01/18/20 1230  [facility] office visit with complex patient to initial nutritional consult  Alexis Farmer was referred to [facility] on 11/17/19 Referral source [place] for uncontrolled Diabetes A1C of 9.9, SE for possible housing and food resources.  Insurance Medicare, [employer]  [facility] RN CM called Alexis Farmer and left a voice message on 01/18/20 at 1007  She returned the call on 01/18/20 at 1009  She confirmed she was in the office of Alexis Farmer, nutritionist  She had been assisted by her daughter, Fraser Din with transport to this appointment Her granddaughter is reported to be out of town  [place] met with Alexis Farmer and Alexis Farmer to offer support and information during the visit The visit went well  Alexis Farmer was able to offer and receive information related to her present meal plan and future meal plan     Alexis Farmer at the end of the nutritional visit discussed with [facility] RN CM that she was in need of relocation of housing. She reports difficulty with entering her apartment She shares her history of falls x 3 at this apartment complex related to accessibility in the entry and her mobility concern. 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 Again [facility] RN CM discussed Rheumatoid arthritis and treatment and again Alexis Farmer states "there is nothing they can do but medicines". She requested assist with finding a more handicap apartment/housing.  [facility] RN CM sent a referral to [place] for housing needs  Plans [facility] RN CM will follow up with Alexis Farmer within the next 14-21 business days Pt encouraged to return a call to [facility] RN CM prn  Joelene Millin L. Lavina Hamman, RN, BSN, [place] Coordinator Office number [phone] Mobile number [phone]  Main [facility] number [phone] Fax number [phone]

## 2020-01-21 ENCOUNTER — Encounter: Payer: Self-pay | Admitting: *Deleted

## 2020-01-21 ENCOUNTER — Other Ambulatory Visit: Payer: Self-pay | Admitting: *Deleted

## 2020-01-21 NOTE — Patient Outreach (Signed)
[facility] [facility]) Care Management  01/21/2020  Alexis Farmer 03-06-1941 [id number]  CSW was able to make initial contact with patient today to perform phone assessment, as well as assess and assist with social work needs and services.  CSW introduced self, explained role and types of services provided through [place] [facility] ([place] [facility]).  CSW further explained to patient that CSW works with patient's RNCM, also with [place] [facility], Jackelyn Poling.  CSW then explained the reason for the call, indicating that Alexis Farmer thought that patient would benefit from social work services and resources to assist with relocating into a handicapped accessible apartment.  CSW obtained two HIPAA compliant identifiers from patient, which included patient's name and date of birth.  Patient admitted that she is wanting to move into a handicapped accessible apartment, as the one that she is currently residing in does not have any handicapped accessible amenities, nor is management willing to install any.  CSW agreed to assist patient with trying to relocate, mailing patient the following list of resources:  Housing Resources in [place]; [place] for Seniors; Application for Housing - HUD Section 8 Property.  CSW then agreed to follow-up with patient next week, on Thursday, January 28, 2020, around 9:00am, to assist with completion of the applications and referral process.  CSW was able to ensure that patient has the correct contact information for CSW, encouraging patient to contact CSW directly if additional social work needs arise in the meantime.  [name], BSW, MSW, LCSW  Licensed Education officer, environmental Health System  Mailing [place] [address], [place], [place] [address] Physical Address-[address], [place], [place] [address] Toll Free Main # [phone] Fax #  [phone] Cell # [phone]  Office # [phone] [name]

## 2020-01-22 ENCOUNTER — Encounter: Payer: Self-pay | Admitting: *Deleted

## 2020-01-22 ENCOUNTER — Other Ambulatory Visit: Payer: Self-pay | Admitting: Internal Medicine

## 2020-01-22 DIAGNOSIS — N1832 Chronic kidney disease, stage 3b: Secondary | ICD-10-CM

## 2020-01-22 DIAGNOSIS — E1122 Type 2 diabetes mellitus with diabetic chronic kidney disease: Secondary | ICD-10-CM

## 2020-01-22 DIAGNOSIS — IMO0002 Reserved for concepts with insufficient information to code with codable children: Secondary | ICD-10-CM

## 2020-01-22 DIAGNOSIS — N183 Chronic kidney disease, stage 3 unspecified: Secondary | ICD-10-CM

## 2020-01-22 DIAGNOSIS — Z794 Long term (current) use of insulin: Secondary | ICD-10-CM

## 2020-01-22 DIAGNOSIS — E1165 Type 2 diabetes mellitus with hyperglycemia: Secondary | ICD-10-CM

## 2020-01-22 MED ORDER — SPIRONOLACTONE 25 MG PO TABS: 25.0000 mg | ORAL_TABLET | Freq: Every day | ORAL | 3 refills | Status: DC

## 2020-01-22 MED ORDER — INSULIN REGULAR HUMAN 100 UNIT/ML IJ SOLN: INTRAMUSCULAR | 11 refills | Status: DC

## 2020-01-22 MED ORDER — INSULIN NPH (HUMAN) (ISOPHANE) 100 UNIT/ML ~~LOC~~ SUSP: SUBCUTANEOUS | 12 refills | Status: DC

## 2020-01-25 ENCOUNTER — Other Ambulatory Visit: Payer: Self-pay | Admitting: Internal Medicine

## 2020-01-25 DIAGNOSIS — N183 Chronic kidney disease, stage 3 unspecified: Secondary | ICD-10-CM

## 2020-01-25 DIAGNOSIS — IMO0002 Reserved for concepts with insufficient information to code with codable children: Secondary | ICD-10-CM

## 2020-01-25 NOTE — Telephone Encounter (Signed)
Inform patient below   Insurance will no longer cover novolin N She has appt 02/04/20 and I expect Dr. Ladell Pier to take over diabetes and thyroid care as this is his area of expertise  Does she want to try [place] and Catie help with conversion please novolin N and on novolin R to tresiba ?  Thanks [facility]

## 2020-01-25 NOTE — Telephone Encounter (Signed)
Insurance no longer covering novolin N I believe is she agreeable to switch to tresiba if so catie can you help with conversion Also on novolin R   She has endocrine appt 02/04/20 Dr. Ladell Pier and I expect him to take over her diabetes and thyroid care completely   Thanks [place]

## 2020-01-26 NOTE — Telephone Encounter (Signed)
Patient calling back in and stated she spoke with her insurance. Her insurance states they cover Novolin but doses not cover Humulin insulin.   Patient states she does not want to switch medications. Informed her that the [place] may work better for her per Dr Olivia Mackie McLean-Scocuzza. Patient declines changing medications. States her current med is authorized through the next year.

## 2020-01-26 NOTE — Telephone Encounter (Signed)
Patient last reported being on Novolin N 20 units QAM, 15 units QPM. Recommend changing to [place] 30 units once daily (a little more than a 20% dose reduction, which I think will be appropriate d/t uncontrolled sugars)

## 2020-01-26 NOTE — Telephone Encounter (Signed)
Patient informed and verbalized understanding.  States she will switch to the [place]. States she will also reach out to her insurance to check why it was switched.   Patient informed of upcoming appointment with Dr Honor Junes as well.

## 2020-01-26 NOTE — Telephone Encounter (Signed)
Left message to return call 

## 2020-01-27 ENCOUNTER — Other Ambulatory Visit: Payer: Self-pay | Admitting: Internal Medicine

## 2020-01-27 ENCOUNTER — Telehealth: Payer: Self-pay | Admitting: Internal Medicine

## 2020-01-27 DIAGNOSIS — IMO0002 Reserved for concepts with insufficient information to code with codable children: Secondary | ICD-10-CM

## 2020-01-27 DIAGNOSIS — N183 Chronic kidney disease, stage 3 unspecified: Secondary | ICD-10-CM

## 2020-01-27 MED ORDER — INSULIN NPH (HUMAN) (ISOPHANE) 100 UNIT/ML ~~LOC~~ SUSP: SUBCUTANEOUS | 12 refills | Status: DC

## 2020-01-27 NOTE — Telephone Encounter (Signed)
Faxed response requested [facility]

## 2020-01-27 NOTE — Telephone Encounter (Signed)
Please call pharmacy and disc with pharm tech or pharm D dispense novolin N as others not covered by insurance I.e humulin  She should have enough refills to see endocrine upcoming

## 2020-01-28 ENCOUNTER — Other Ambulatory Visit: Payer: Self-pay | Admitting: *Deleted

## 2020-01-28 NOTE — Patient Outreach (Signed)
[place] [facility]) Care Management  01/28/2020  Alexis Farmer 02/22/1941 [id number]   CSW was able to make contact with patient today to follow-up regarding social work services and resources, as well as to ensure that patient received the packet of resource information mailed to her home by CSW last week.  Patient denied having received the packet, as of yet, but admitted that she has been checking her mail on a daily basis, waiting for it to arrive.  CSW explained to patient that CSW just mailed the packet on Thursday of last week (January 21, 2020), so it may arrive today, or within the next few days, encouraging patient to contact CSW on Monday, February 01, 2020 if the packet has not arrived by then.  Patient voiced understanding and was agreeable to this plan.  Otherwise, CSW agreed to contact patient again next week, on Wednesday, February 03, 2020, around 11:00am, to answer any questions she may have pertaining to the information received, as well as assist with the referral process and application completion.  [name], BSW, MSW, LCSW  Licensed Education officer, environmental Health System  Mailing [place] [address], [place], [place] [address] Physical Address-[address], [place], [place] [address] Toll Free Main # [phone] Fax # [phone] Cell # [phone]  Office # [phone] [name]

## 2020-01-28 NOTE — Telephone Encounter (Signed)
Patient called and said her pharmacy did not receive prescription through fax machine.

## 2020-01-28 NOTE — Telephone Encounter (Signed)
Can you call pharmacy and see whats going on with novolin N Or what does pt need

## 2020-01-28 NOTE — Telephone Encounter (Signed)
Pharmacy states that this is covered but due to the quantity they had to order it. States once it comes in this will cost $15.00.

## 2020-01-28 NOTE — Telephone Encounter (Signed)
Spoke with the pharmacy and the state that the Patient's medications had to be ordered due to quantity and this is covered. Will cost $15.00

## 2020-02-01 ENCOUNTER — Encounter: Payer: Self-pay | Admitting: *Deleted

## 2020-02-01 ENCOUNTER — Other Ambulatory Visit: Payer: Self-pay | Admitting: *Deleted

## 2020-02-01 NOTE — Patient Outreach (Signed)
[place] [facility]) Care Management  02/01/2020  Alexis Farmer Sep 12, 1940 [id number]   CSW received an incoming call from patient today, indicating that she received the packet of resource information that CSW mailed to her home.  Patient admitted to receiving the packet of resources on Friday, January 29, 2020, and that she is already in the process of filling out the application for housing assistance.  While conversing with patient over the phone, CSW was able to answer specific questions on the application, as well as assist patient with application completion.  CSW reminded patient that she will need to submit the application directly to the [employer] for processing, for which she indicated would not be a problem.  CSW further reminded patient that she needs to include in the application that she will need a first floor apartment that is completely handicapped accessible.  Last, CSW reminded patient that the [employer] currently has an extensive waiting list, and that she may not receive a return call from a representative for several months.  Patient voiced understanding and was agreeable to this plan, reporting that she will continue to pursue other avenues independently.  CSW will perform a case closure on patient, as all goals of treatment have been met from social work standpoint and no additional social work needs have been identified at this time.  CSW will notify patient's RNCM with [place] [facility], Alexis Farmer of CSW's plans to close patient's case.  CSW will fax an update to patient's Primary Care Physician, Dr. Olivia Mackie McLean-Scocuzza to ensure that they are aware of CSW's involvement with patient's plan of care, as well as route a Physician Case Closure Letter.  CSW was able to confirm that patient has the correct contact information for CSW, encouraging patient to contact CSW directly if she has additional  questions, needs additional assistance with application completion or if additional social work needs arise within the near future.  [name], BSW, MSW, LCSW  Licensed Education officer, environmental Health System  Mailing [place] [address], [place], [place] [address] Physical Address-[address], [place],  [address] Toll Free Main # [phone] Fax # [phone] Cell # [phone]  Office # [phone] [name]

## 2020-02-03 ENCOUNTER — Ambulatory Visit: Payer: Self-pay | Admitting: *Deleted

## 2020-02-04 ENCOUNTER — Other Ambulatory Visit: Payer: Self-pay | Admitting: *Deleted

## 2020-02-04 ENCOUNTER — Other Ambulatory Visit: Payer: Self-pay

## 2020-02-04 DIAGNOSIS — E042 Nontoxic multinodular goiter: Secondary | ICD-10-CM | POA: Insufficient documentation

## 2020-02-04 NOTE — Patient Outreach (Addendum)
[name]) Care Management  02/04/2020  Alexis Farmer 18-May-1941 [id number]   [facility] office visit with complex patient to new endocrinology consult  Alexis Farmer was referred to [facility] on 11/17/19 Referral source [facility], [place] for uncontrolled Diabetes A1C of 9.9, SE for possible housing and food resources.  Insurance Medicare, Cigna  [facility] RN CM called Alexis Farmer to confirm her 02/04/20 appointment with her new endocrinologist She voiced frustration that she was in pain today "aching all over and I started to call it off but I am going to try to make it" She reports her daughter, Mardene Celeste will be taking her to the appointment CM attempted to discuss again the importance of seeing a rheumatologist or pain management provider but she states she does not want to discuss at this time   [facility] RN CM met Alexis Farmer at the [place] clinic for her appointment with Dr Mee Hives, endocrinology as agreed related to her reported unpleasant experiences with other endocrinology providers. She was in a wheelchair and had a cane with her  Pain She was noted at intervals to move, moan, to rub her hands & elbows or grimace with pain but would state she was "okay" pain level 5 "all over" During the office visit She and [facility] RN CM mentioned her pain as a barrier in her home health management and it was discussed to follow up with primary care provider (PCP), Dr Mclean-Scocuzza  Diabetes During this office visit Dr Honor Junes discussed her thyroid removal about 13 years ago and the need to continue to monitor as she is still reported to have nodules  And this affects her diabetes With review of the medicine bottles Alexis Farmer presented to the nurse at check out and to Dr Honor Junes, She is NOT taking all medications as recommended especially confirmed her amlodipine Her cbg Value this morning was reported as 130 and she did not take medications to include her NPH prior to this  appointment as her preference The importance of having eye exams and sending Dr Honor Junes the report was discussed  Dr Honor Junes assess for Alexis Farmer diabetes home management with use of NPH 40 units and regular insulin after meals. Her HgA1c today is 10.4 (was 9.8) and has increased. She states this is related to her "cheating" and days of not eating as recommended related to pain or "I stay depressed and then I eat" Throughout the visit, [facility] RN CM offered Alexis Farmer previous voiced concerns with fears of hypoglycemia episodes that remind her of her mother's Diabetes coma experience and living alone without someone to help her monitor her to prevent hypoglycemic episodes. She shared her prefer to keep the cbg values elevated at night "in case I do not wake up" Dr Honor Junes assessed for possible use of glucose sensing devices like the freestyle Libre to help with monitoring and alerting for hypoglycemia episodes but Alexis Farmer confirms she has tried 2 devices but they "made me more sore" and her preference is to continue to do finger sticks Dr Honor Junes discussed a change in insulin home treatment plan and Alexis Farmer initial discussed preferring not to change her home insulin treatment plan but with discussion of her HgA1c increase and cbg values remaining in the 200-300s, she agreed her present home treatment plan could be changed. Dr Honor Junes discussed the differences in coverage of NPH vs Lantus (insurance may prefer basaglar which is the same). She voiced understanding that Lantus is long acting and will assist with  decrease in episodes of hypoglycemia  The home plan was changed to: "Let's switch your NPH insulin to a once a day insulin called Lantus (insurance may prefer basaglar which is the same). This is a better insulin than the NPH as it doesn't have a peak to it. The peak of NPH increases the risk of hypoglycemia, so this new insulin should have less risk of low sugars.  Let's start  with 30 units once a day (you are currently taking 40 of the NPH so this will be a lower dose so I am hopeful it won't cause any low sugars). Take the Lantus (or basaglar) every morning whether you are eating or not. It is just once a day.  Keep taking the Regular every time you eat. Adjust the dose how you are doing it now (based on food and glucose).  Keep following up with Dr. Candiss Norse for your blood pressure.  Ask Dr. Olivia Mackie about the all over pain"  Follow up in 6 weeks on 03/17/20 at 1100 to have an ultrasound to check her thyroid and at 1145 see Dr Honor Junes Alexis Farmer confirms she has worked with [facility], [name] and is appreciative of the outreach to assist with housing plus  [facility] RN CM reviewed the office visit important outcomes prior to Alexis Farmer leaving the office. [facility] RN CM assisted [facility] she would be able to afford the cost of the new insulin. Her insurance will not cover Lantus but Basaglar for a 90 day supply at $15 for 3 bottles. Alexis Farmer was pleased with this and reports she will pick up [place] from her pharmacy today and begin this new diabetes treatment plan. She voiced her appreciation for [facility] RN CM attendance to this appointment to help her establish a good working relationship with Dr Honor Junes  [facility] RN CM will follow up with Alexis Segundo within the next 7-14 business days Pt encouraged to return a call to [facility] RN CM prn  Goals Addressed              This Visit's Progress     Patient Stated   .  Patient will be able to manage her diabetes at home with evidence of her HgA1c will be the recommend <7 (pt-stated)   Not on track     Alexis Farmer (see longtitudinal plan of care for additional care plan information)  Objective:  Lab Results  Component Value Date   HGBA1C 9.8 (H) 09/21/2019 .   Lab Results  Component Value Date   CREATININE 1.08 (H) 10/24/2019   CREATININE 1.11 09/21/2019   CREATININE 1.0 02/19/2019  .   [place] Kitchen No results found for: EGFR  Current Barriers:  [place] Kitchen Knowledge Deficits related to basic Diabetes pathophysiology and self care/management . Cognitive Deficits  Case Manager Clinical Goal(s):  Over the next 90 days, patient will demonstrate improved adherence to prescribed treatment plan for diabetes self care/management as evidenced by:  [place] Kitchen Verbalize adherence to ADA/ carb modified diet within the next 45 days . Verbalize adherence to prescribed medication regimen within the next 60 days   Interventions:  . Provided education to patient about basic DM disease process . Discussed plans with patient for ongoing care management follow up and provided patient with direct contact information for care management team . Provided patient with written educational materials related to hypo and hyperglycemia and importance of correct treatment . Reviewed scheduled/upcoming provider appointments including: primary care provider  endocrinology Nutritionist . Accompany patient  arranged nutritionist appointment (01/18/20) & New endocrinology appointment on 02/04/20  - change in insulin tx  . Referral to [place] for housing needs 01/20/20  Patient Self Care Activities:  . Self administers oral medications as prescribed . Self administers insulin as prescribed . Attends all scheduled provider appointments . Checks blood sugars as prescribed and utilize hyper and hypoglycemia protocol as needed . Adheres to prescribed ADA/carb modified  Initial goal documentation Please see other previous [facility] care plan information listed in Epic under the flow sheet section        Brandom Kerwin L. Lavina Hamman, RN, BSN, [place] Coordinator Office number [phone] Mobile number [phone]  Main [facility] number [phone] Fax number [phone]

## 2020-02-05 ENCOUNTER — Other Ambulatory Visit: Payer: Self-pay | Admitting: *Deleted

## 2020-02-05 NOTE — Patient Outreach (Signed)
[facility]) Care Management  02/05/2020  Alexis Farmer [birth year] [id number]   [facility] office return call to complex patient  Mrs Alexis Farmer was referred to [facility] on 11/17/19 Referral source [facility] leadership, Kingsley Callander for uncontrolled Diabetes A1C of 9.9, SE for possible housing and food resources.  Insurance Medicare, Cign[employer]  [facility] RN CM returned a call to [name] after receiving an urgent voice message from her today requesting a return call "please call me as soon as you hear the message. I need to talk to you"  Patient is able to verify HIPAA ([place]) identifiers, date of birth (DOB) and address Reviewed and addressed the purpose of the follow up call with the patient  Consent: [facility] RN CM reviewed [facility] services with patient. Patient gave verbal consent for services.   [name] began to inform [facility] RN CM she was not pleased with starting her new insulin treatment as she read the side effects provided to her by the pharmacy. She states "why does he want me to use this?"  She discussed having congestive Heart Failure (CHF) and Chronic Kidney disease (CKD).  She states Engineer, agricultural (common brands: lantus solostar, basaglar kwikpen, toujeo solostar) side effects may worsen her CHF and CKD symptoms, "mess up my kidneys".  She was given time to ventilate her feelings She mentions she did not have a sheet with the side effects of Novolin NPN She states during the outreach that she is "afraid to start" the [place]. [facility] RN CM acknowledged and discussed her fear and encouraged her to speak with her local pharmacist about the side effects of Novolin NPH and Basaglar to gain more knowledge before making a decision to not start her new diabetes treatment plan  She apologized for calling and stated she would outreach to Dr Honor Junes on "Monday"   Plans [facility] RN CM will follow up with [name] within the next  7-14 business days Pt encouraged to return a call to [facility] RN CM prn  Joelene Millin L. Lavina Hamman, RN, BSN, [place] Coordinator Office number [phone] Mobile number [phone]  Main [facility] number [phone] Fax number [phone]

## 2020-02-08 ENCOUNTER — Telehealth: Payer: Self-pay | Admitting: Internal Medicine

## 2020-02-08 NOTE — Telephone Encounter (Signed)
Called and spoke with the patient. She sates she saw Dr Honor Junes last week and was placed on Basaglar insulin. Patient states she felt unease starting a new medication. She sates that she went home and prayed on it. Once deciding not to take the medication she felt peace.   Patient states that she read up on [place] and was informed that this insulin can worsen liver and kidney problems. Patient sates that she was told at one point she has problems in these areas and does not understand why she was given this medication if it could be made worse.   She states that she knows she needs to change her diet in order for her medications to help her properly. She states she has been eating better so she should not need to change her medications. States that her diet is a work in progress.   I informed the patient that some insulins may work better for higher sugars while she is regulating her diet. Patient states she understands but does not want to change her medication. States she threw the [place] away and canceled this at her pharmacy.   For your information

## 2020-02-08 NOTE — Telephone Encounter (Signed)
Patient called in stated that she went to blood doctor and they prescribed Vasaglar and she got home and prayed about it, and she said she is not going to take it and would like a call to discuss this.

## 2020-02-12 ENCOUNTER — Telehealth: Payer: Self-pay

## 2020-02-12 NOTE — Telephone Encounter (Signed)
    MA8/27/2021 1st Attempt  Name: Alexis Farmer   MRN: [id number]   DOB: 06-Apr-1941   AGE: 79 y.o.   GENDER: female   PCP McLean-Scocuzza, Nino Glow, MD.   02/12/20 Left message on voicemail for patient's grand-daughter Alexis Farmer regarding housing resources.  Will attempt to call again next week.   Carols Clemence, AAS Paralegal, [place] . Embedded Care Coordination [facility] Health  Care Management  [address], [place] [address] [email or web address]  [phone]  [email or web address]

## 2020-02-18 NOTE — Telephone Encounter (Signed)
Spoke with Alexis Farmer; her contact information was old.   Cell phone is: [phone] email is [email or web address].   I've updated this in the chart. Thanks, Millie!

## 2020-02-18 NOTE — Telephone Encounter (Signed)
    MA9/07/2019 2nd Attempt  Name: Alexis Farmer   MRN: [id number]   DOB: 1940/10/20   AGE: 79 y.o.   GENDER: female   PCP McLean-Scocuzza, Nino Glow, MD.   02/18/20 Left message on voicemail for patient's grand-daughter Ernst Bowler regarding housing resources. Emailed housing resources for [employer] and [employer] to [email or web address].  Will attempt to call again next week.    Samuel Rittenhouse, AAS Paralegal, [place] . Embedded Care Coordination [facility]  Care Management  [address], [place] [address] [email or web address]  [phone]   [email or web address]

## 2020-02-19 ENCOUNTER — Telehealth: Payer: Self-pay | Admitting: Internal Medicine

## 2020-02-19 DIAGNOSIS — M25521 Pain in right elbow: Secondary | ICD-10-CM

## 2020-02-19 MED ORDER — DICLOFENAC SODIUM 1 % EX GEL: 2.0000 g | Freq: Four times a day (QID) | CUTANEOUS | 0 refills | Status: DC

## 2020-02-19 NOTE — Telephone Encounter (Signed)
Pt needs as refill on diclofenac Sodium (VOLTAREN) 1 % GEL sent to [facility]

## 2020-02-22 ENCOUNTER — Other Ambulatory Visit: Payer: Self-pay | Admitting: Internal Medicine

## 2020-02-22 DIAGNOSIS — M25521 Pain in right elbow: Secondary | ICD-10-CM

## 2020-02-22 MED ORDER — DICLOFENAC SODIUM 1 % EX GEL: 2.0000 g | Freq: Four times a day (QID) | CUTANEOUS | 12 refills | Status: DC

## 2020-02-22 NOTE — Telephone Encounter (Signed)
Please fax this note to Dr. Honor Junes to address and explain further to convince her to take basaglar asap   Does pt want to see nutrition?

## 2020-02-23 ENCOUNTER — Ambulatory Visit: Payer: Medicare Other | Admitting: Dietician

## 2020-02-23 NOTE — Telephone Encounter (Signed)
Encounter notes faxed via Epic routing to Dr Honor Junes.

## 2020-02-25 ENCOUNTER — Other Ambulatory Visit: Payer: Self-pay | Admitting: *Deleted

## 2020-02-25 ENCOUNTER — Other Ambulatory Visit: Payer: Self-pay

## 2020-02-25 ENCOUNTER — Encounter: Payer: Self-pay | Admitting: Internal Medicine

## 2020-02-25 NOTE — Patient Outreach (Signed)
[facility]) Care Management  02/25/2020  Alexis Farmer 01-19-1941 [id number]   [facility] outreach to complex care patient Alexis Farmer was referred to [facility] on 11/17/19 Referral source [place] for uncontrolled Diabetes A1C of 9.9, SE for possible housing and food resources.  Insurance Medicare, [place]  Patient is able to verify HIPAA ([place] and Accountability Act) identifiers, date of birth (DOB) and address Reviewed and addressed the purpose of the follow up call with the patient  Consent: [facility]) RN CM reviewed [facility] services with patient. Patient gave verbal consent for services.  Follow up  Alexis Farmer answered and after a greeting and HIPAA verification, Made the following statements "sick and hurting all over." "I thought you had turned me loose", :I'm not going to the nutritionist" as she reports she could not follow the diet/meal plan on days "I hurt so bad and eat what I can get especially when I do not feel like cooking. I do not see the point of wasting he time," " I don't want food that is delivered", "I get upset, depressed and cry" " I believe I have taken a whole bottle of Tylenol in one week"  [facility] RN CM made attempts to ask questions but the questions were at times not answered.  [facility] RN CM allowed her time to continue to ventilate her feelings and was able to attempt to address the pain and attempted to get her to identify the pain, it's possible cause and if she had been ever offered a reason or diagnosis for her pain.  [facility] RN CM was informed the pain was greater than a 10 all over her body. "twelve or thirteen" and she had some relief from this pain only when she was being seen by a chiropractor for "sciatica" and got a massage afterwards. She reports this was "stopped three years ago" as she reports it was no longer covered by insurance and she was not able to pay the out of pocket expense of $75 per  treatment.  [facility] RN CM offered to assist but was informed she is still aware of the chiropractor and that "there is nothing you can do to help me"  [facility] RN CM discussed with her that the source of her pain may be different and it is important to find a provider to help her find out what is causing her pain to assist with finding a treatment plan. She was encouraged to return to Dr Aundra Dubin to get an evaluation or a referral to a provider that could help her find out what is causing her pain to assist with finding a treatment plan.  She voices uncertainty of this action plan Alexis Farmer states she was receiving a call and was encouraged to answer her phone   Alexis Farmer returned a call to [facility] RN CM and expressed her daughter was calling her to attempt to clarify an appointment she would be assisting her to get to. Her feelings about her daughter was expressed  Alexis Farmer began after this to discuss receiving "shot to my knee" via "Flexogenic" and "I am not going back" even though she has "two more appointments" Alexis Farmer then inquired about housing and reported she has missed outreach calls from housing staff, has attempted to return the call unsuccessfully and voices concerns that her application for housing assistance will not be addressed. [facility] RN CM attempted to help her process/think of an action plan and attempted to get her to  provide [facility] RN CM with the number she has been calling but was told "Don't worry about it"  [facility] RN CM inquired about depression symptoms  Alexis Farmer reports she is not as much depressed as she is angry and in pain and she has not been seen by a counselor and confirmed she does not want to harm herself or others. She reports she has been attempting to rely on her "bible time with the Reita Cliche"  Alexis Farmer requests to conclude the outreach  [facility] RN CM encouraged her to return a call to [facility] RN CM when she was ready to receive [facility] RN CM assistance  Plan [facility] RN CM will  follow up with Alexis Farmer if she does not return a call in the next 21-28 business days to discussed case closure if she is not wanting to progress or receive [facility] services at that time [facility] RN CM has been with Alexis Farmer to office appointments x 2 to get her connected to a nutritionist and a new endocrinologist. Alexis Farmer has discussed not following the recommendations of the nutritionist and endocrinologist. She has frequently discussed her pain but has not been willing to make the attempt to seek a provider to help her find out what is causing her pain to assist with finding a treatment plan.   Routed note to MD  Goals Addressed              This Visit's Progress     Patient Stated   .  [facility]) Patient will be able to manage her diabetes at home with evidence of her HgA1c will be the recommend <7 (pt-stated)   Not on track     [place] (see longitudinal plan of care for additional care plan information)  Objective:  Lab Results  Component Value Date   HGBA1C 9.8 (H) 09/21/2019 .   Lab Results  Component Value Date   CREATININE 1.08 (H) 10/24/2019   CREATININE 1.11 09/21/2019   CREATININE 1.0 02/19/2019 .   Marland Kitchen No results found for: EGFR  Current Barriers:  Marland Kitchen Knowledge Deficits related to basic Diabetes pathophysiology and self care/management . Cognitive Deficits  Case Manager Clinical Goal(s):  Over the next 90 days, patient will demonstrate improved adherence to prescribed treatment plan for diabetes self care/management as evidenced by:  Marland Kitchen Verbalize adherence to ADA/ carb modified diet within the next 45 days . Verbalize adherence to prescribed medication regimen within the next 60 days  . 02/25/20 NOT progressing/ not meeting goals [facility] RN CM has been with Alexis Farmer to office appointments x 2 to get her connected to a nutritionist and a new endocrinologist. . Alexis Farmer has discussed not following the recommendations of the nutritionist and endocrinologist.  She has frequently discussed her pain but has not been willing to make the attempt to seek a provider to help her find out what is causing her pain to assist with finding a treatment plan.    Interventions:  . Provided education to patient about basic DM disease process . Reviewed medications with patient and discussed importance of medication adherence . Discussed plans with patient for ongoing care management follow up and provided patient with direct contact information for care management team . Provided patient with written educational materials related to hypo and hyperglycemia and importance of correct treatment . Reviewed scheduled/upcoming provider appointments including: primary care provider  endocrinology Nutritionist . Accompany patient  arranged nutritionist appointment (01/18/20) & New endocrinology appointment on 02/04/20  -  change in insulin tx  . Referral to [place] for housing needs 01/20/20 . 02/25/20 Offer option of case closure with next outreach  Patient Self Care Activities:  . Self administers oral medications as prescribed . Self administers insulin as prescribed . Attends all scheduled provider appointments . Checks blood sugars as prescribed and utilize hyper and hypoglycemia protocol as needed . Adheres to prescribed ADA/carb modified  Please see past updates related to this goal by clicking on the "Past Updates" button in the selected goal  Please see other previous [facility] care plan information listed in Epic under the flow sheet section        Carolos Fecher L. Lavina Hamman, RN, BSN, [place] Coordinator Office number [phone] Main [facility] number [phone] Fax number [phone]

## 2020-02-25 NOTE — Telephone Encounter (Signed)
    MA9/02/2020 3rd Attempt  Name: Alexis Farmer   MRN: [id number]   DOB: 1940/06/27   AGE: 79 y.o.   GENDER: female   PCP McLean-Scocuzza, Nino Glow, MD.   02/25/20 Spoke with patient's grand-daughter Esau Grew she has received housing resources for her grandmother and also stated that her grandmother has completed an application and sent to [employer] waiting for response.  Asked if there were any other resources needed, no additional resources needed at this time.  Closing referral.    Roxann Vierra, AAS Paralegal, [place] . Embedded Care Coordination [facility] Health  Care Management  [address], [place] [address] [email or web address]  [phone]   [email or web address]

## 2020-03-01 NOTE — Patient Outreach (Signed)
Alexis Farmer) Care Management  03/01/2020  Alexis Farmer April 14, 1941 [id number]   For 12/22/19  Alexis Farmer)  Encounter Date:  12/22/2019        (suggestion)  NoteWriter selections for this note were unable to be saved. Complete documentation in a new note, and then delete this note.     [facility] Telephone Screen forMDreferral  Referral Date:11/17/19 Referral Source:[place] Referral Reason:referral request from Alexis Farmer:  Please assign Ms. Kulakowski below to a RN for uncontrolled Diabetes, A1C of 9.9.  She also needs Farmer for possible housing and food resources. We can set up Mom's meals with a diabetic meal plan for 2 weeks.  She has a Software engineer only (Alexis Farmer) at [employer] station=non-embedded for RN/Farmer.  Insurance:Medicare, Christella Scheuermann   [facility] RN Farmer received a message from this patients' [place] staff on 11/19/19 Patient's granddaughter, Alexis Farmer, works at [employer] indicated pt did not want to discussher medications, as she was immediately on the defensive about not wanting to change things. However, very worried about hypoglycemia.Pharmacisttried to reiterate thatshecouldbe assisted to geton a regimen with a lower risk of hypoglycemia, but she was not interested. Patient didagree to talk to Kaylor her endo appointment next month, but she's pretty much planning on not following with him long term.   Care Guide provided some housing options to [place] and her mom, so they are working on investigating those.  Alexis Farmer also updated [facility] RN Farmer on 11/26/19 that a call attempt was made to patient    Patient is able to verify HIPAA (Alexis Farmer) identifiers, date of birth (DOB) and address   Today's concerns includes pain in her hands but she is on improving on her home care of her diabetes  Pain of hands. 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 This inquiry was made to assess the use of her hands and extremities related to her reported pain.  Alexis Farmer discussed the difference in osteoarthritis and sciatica pain  Alexis Farmer informs [facility] RN Farmer with attempts to discuss medical care for these pain episodes, "Nothing can be done"  Alexis Farmer confirms with [facility] RN Farmer she changes the subject when she wants to avoid thinking of medical conditions. She has reported this in the past related to her diabetes and her chronic kidney disease (CKD).    She confirms today she had a call from a nutritionist ans is scheduled to be seen on January 18, 2020 1030 [address] floor in [place]  During the call she receive a visit from her maintenance man to fix her stopped up tub  Has received mailed EMMI Had read most of the information but voices she has difficulty concentrating when in pain  Tried coke zero and she like it   Chronic kidney disease   nephrology Alexis Farmer next Tuesday  Alexis  Farmer reports her frustrations related to how she feels about her care for her CKD.  Alexis Farmer ruined kidney and pressure 12/05/19 blames illnesses on Alexis Farmer  cv seen in March and cleared me Alexis Farmer   Diabetes reports the following cbg values  Today 0830 254  Am cbgs range 145-250  A typical day of reported cbgs includes 0800 am 145, ate breakfast, 1100 ate eggs 158 &  took 2 u,  felt fine,  177 at 1600 took 2 units, 1945 278 took 4 units She reports 2 lows recently in which she states she noted her hands becoming numb She reports taking in ensure to increase the cbg  December 17, 2019 cbg values  0720 160 later in the am after eating cbg=234, cbg= 211  at 1620  Friday December 18, 2019 cbg=161 at 2 am, 0600 cbg= 277  Saturday December 19, 2019 cbg 199 at [facility]  Sunday December 20, 2019 0830 cbg=181  Monday December 21, 2019 slept all day woke up with a cbg of 224 at noon  now watching what she ate   Today Alexis Burnham agreed she would follow her sliding scale as ordered for the rest of the week She reports "I had given up I was depressed"  Hypertension Had a BP cuff she does not feel works well so she does not check at home   Social Alexis Farmer is a 79 year old widowed female who has support of her family She reports having a large family of lots of a daughter, granddaughter and lots of grandchildren and great grandchildren. One of her grand daughters (age 48) is paid to assist her to bring in her groceries after she purchases them. Alexis Farmer, her granddaughter that she "raised. 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 She is from [place].Her husband passed "eight" years ago at age 66. She reports he drank etoh. He reports she still have people calling her wanting to speak with him.  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, abdominal aortic atherosclerosis, GI bleed, rectal bleed, cervical spondylosis with radiculopathy, osteoarthritis, arthritis, disorder of right rotator cuff, Pedal edema, morbid obesity on  11/10/19 weight =258 lbs Height=5'3" BMI =45.86, anxiety, depression, vertigo, chronic fatigue, bradycardia, constipation, falls hypercalcemia, retinal hole of right eye, postmenopausal bleeding, diverticulosis, history (hx)of blood transfusion, arthritis, 07/21/19 had covid vaccine, never smokerNever intake of alcohol   Fall recent x 1-[facility] saw 03/29/19 closed fx lateral malleolus right fibular broken ankle-was using her walker Had a fall in march 2021 outside on concrete using her cane   DME walker cbg monitor (accu chek guide To check 3 times a day)dentures  [facility] RN Farmer will follow up with patient within the next 30 days Pt encouraged to return a call to [facility] RN Farmer prn   Johniya Durfee L. Lavina Hamman, RN, BSN, [place] Coordinator Office number [phone] Mobile number [phone]  Main Alexis Farmer number [phone] Fax number [phone]

## 2020-03-02 ENCOUNTER — Telehealth: Payer: Self-pay | Admitting: Pharmacist

## 2020-03-02 ENCOUNTER — Ambulatory Visit: Payer: Medicare Other | Admitting: Pharmacist

## 2020-03-02 DIAGNOSIS — E1165 Type 2 diabetes mellitus with hyperglycemia: Secondary | ICD-10-CM

## 2020-03-02 NOTE — Telephone Encounter (Signed)
°  Chronic Care Management   Note  03/02/2020 Name: Nikkie Liming MRN: [id number] DOB: June 18, 1941   Attempted to contact patient for scheduled appointment for medication management support. Left HIPAA compliant message for patient to return my call at their convenience.    Plan: - If I do not hear back from the patient by end of business today, will collaborate with Care Guide to outreach to schedule follow up with me  Catie Darnelle Maffucci, PharmD, [place], [place] Pharmacist [place] [phone]

## 2020-03-02 NOTE — Chronic Care Management (AMB) (Signed)
  Chronic Care Management   Note  03/02/2020 Name: Kelita Wallis MRN: [id number] DOB: 1940/12/15  Avie Checo is a 79 y.o. year old female who is a primary care patient of McLean-Scocuzza, Nino Glow, MD. The CCM team was consulted for assistance with chronic disease management and care coordination needs.    Contacted patient today for medication management review. She notes that she has no interested in changing medications, and no interest in discussing her medications either. Reviewed that changing to a different type of insulin would help reduce her risk of low blood sugars, and help provide more consistent blood sugar lowering. Reviewed that I am not sure what she was reading, but that no insulins are damaging to her liver or kidneys. Encouraged to continue to communicate her concerns to her healthcare team to allow for open communication and discussion of rationale.   Patient requests to cease CCM follow up. I encouraged her to call me moving forward with any future questions or concerns.   Plan: - Closing case  Catie Darnelle Maffucci, PharmD, [place], CPP Clinical Pharmacist [place] [phone]

## 2020-03-02 NOTE — Telephone Encounter (Signed)
Spoke with patient. See CCM documentation

## 2020-03-02 NOTE — Patient Instructions (Signed)
Alexis Farmer,   It was great talking to you today!  Please feel free to reach back out in the future if you would like to continue to work together. I still strongly agree with Dr. Honor Junes - there are NO concerns with your liver, kidneys, or heart with that new insulin therapy, or any insulin therapy. We do worry about how up and down your sugars are on this regimen. Other medications would be able to provide more consistent blood sugar lowering and be safer for you.   Take care!  [place], PharmD [phone]

## 2020-03-09 ENCOUNTER — Other Ambulatory Visit: Payer: Self-pay

## 2020-03-09 ENCOUNTER — Ambulatory Visit (HOSPITAL_COMMUNITY)
Admission: EM | Admit: 2020-03-09 | Discharge: 2020-03-09 | Disposition: A | Discharge status: Home / Self Care | Payer: Medicare Other | Attending: Family Medicine | Admitting: Family Medicine

## 2020-03-09 DIAGNOSIS — N3090 Cystitis, unspecified without hematuria: Secondary | ICD-10-CM

## 2020-03-09 LAB — POCT URINALYSIS DIPSTICK, ED / UC
Bilirubin Urine: NEGATIVE
Glucose, UA: NEGATIVE mg/dL
Ketones, ur: NEGATIVE mg/dL
Nitrite: NEGATIVE
Protein, ur: 30 mg/dL — AB
Specific Gravity, Urine: 1.01 (ref 1.005–1.030)
Urobilinogen, UA: 0.2 mg/dL (ref 0.0–1.0)
pH: 7 (ref 5.0–8.0)

## 2020-03-09 MED ORDER — SULFAMETHOXAZOLE-TRIMETHOPRIM 800-160 MG PO TABS
1.0000 | ORAL_TABLET | Freq: Two times a day (BID) | ORAL | 0 refills | Status: AC
Start: 2020-03-09 — End: 2020-03-14

## 2020-03-09 NOTE — ED Triage Notes (Signed)
Pt took 6 units of reg insulin this AM but did not take NPH. todays CBG 204

## 2020-03-09 NOTE — ED Provider Notes (Signed)
[place]    ASSESSMENT & PLAN:  1. Cystitis    Begin: Meds ordered this encounter  Medications  . sulfamethoxazole-trimethoprim (BACTRIM DS) 800-160 MG tablet    Sig: Take 1 tablet by mouth 2 (two) times daily for 5 days.    Dispense:  10 tablet    Refill:  0    No signs of pyelonephritis. Discussed. Urine culture sent. Will follow up with her PCP or here if not showing improvement over the next 48 hours, sooner if needed.  Outlined signs and symptoms indicating need for more acute intervention. Patient verbalized understanding. After Visit Summary given.  SUBJECTIVE:  Alexis Farmer is a 79 y.o. female who complains of urinary frequency, urgency and dysuria for the past 1-2 weeks; worse over past few days. Without associated flank pain, fever, chills, vaginal discharge or bleeding. Gross hematuria: not present. No specific aggravating or alleviating factors reported. No LE edema. Normal PO intake without n/v/d. Without specific abdominal pain. OTC treatment: none. H/O UTI: infrequent.  LMP: No LMP recorded. Patient is postmenopausal.    OBJECTIVE:  Vitals:   03/09/20 1004 03/09/20 1006  BP: (!) 160/80   Pulse: 70   Resp: 20   Temp: 98.4 F (36.9 C)   TempSrc: Oral   SpO2: 97%   Weight:  116.6 kg  Height:  5\' 3"  (1.6 m)   General appearance: alert; no distress HENT: oropharynx: moist Lungs: unlabored respirations Abdomen: soft Extremities: no edema; symmetrical with no gross deformities Skin: warm and dry Psychological: alert and cooperative; normal mood and affect  Labs Reviewed  POCT URINALYSIS DIPSTICK, ED / UC - Abnormal; Notable for the following components:      Result Value   Hgb urine dipstick SMALL (*)    Protein, ur 30 (*)    Leukocytes,Ua SMALL (*)    All other components within normal limits    Allergies  Allergen Reactions  . Penicillins Rash    Has patient had a PCN reaction causing immediate rash,  facial/tongue/throat swelling, SOB or lightheadedness with hypotension: Yes Has patient had a PCN reaction causing severe rash involving mucus membranes or skin necrosis: No Has patient had a PCN reaction that required hospitalization No Has patient had a PCN reaction occurring within the last 10 years: Yes If all of the above answers are "NO", then may proceed with Cephalosporin use.     Past Medical History:  Diagnosis Date  . Arthritis   . Chickenpox   . Diabetes mellitus without complication ([place])    one elevated reading/ no treatment  . Diverticulitis   . GI bleed   . High cholesterol   . History of blood transfusion   . Hypertension   . Renal insufficiency    Social History   Socioeconomic History  . Marital status: Widowed    Spouse name: Not on file  . Number of children: 1  . Years of education: 36  . Highest education level: 12th grade  Occupational History  . Occupation: retired  Tobacco Use  . Smoking status: Never Smoker  . Smokeless tobacco: Never Used  Vaping Use  . Vaping Use: Never used  Substance and Sexual Activity  . Alcohol use: No  . Drug use: No  . Sexual activity: Not Currently  Other Topics Concern  . Not on file  Social History Narrative   Lives alone    From [place]   Widowed    Social Determinants of Health   Financial Resource Strain:  Low Risk   . Difficulty of Paying Living Expenses: Not hard at all  Food Insecurity: No Food Insecurity  . Worried About Charity fundraiser in the Last Year: Never true  . Ran Out of Food in the Last Year: Never true  Transportation Needs: No Transportation Needs  . Lack of Transportation (Medical): No  . Lack of Transportation (Non-Medical): No  Physical Activity: Inactive  . Days of Exercise per Week: 0 days  . Minutes of Exercise per Session: 0 min  Stress: Stress Concern Present  . Feeling of Stress : Rather much  Social Connections: Moderately Isolated  . Frequency of Communication with Friends  and Family: More than three times a week  . Frequency of Social Gatherings with Friends and Family: More than three times a week  . Attends Religious Services: More than 4 times per year  . Active Member of Clubs or Organizations: No  . Attends Archivist Meetings: Never  . Marital Status: Widowed  Intimate Partner Violence: Not At Risk  . Fear of Current or Ex-Partner: No  . Emotionally Abused: No  . Physically Abused: No  . Sexually Abused: No   Family History  Problem Relation Age of Onset  . Diabetes Mother   . Hypertension Mother   . Stroke Mother   . Diabetes Other   . Healthy Father   . Diabetes Sister   . Heart disease Sister        Vanessa Kick, MD 03/09/20 1039

## 2020-03-09 NOTE — ED Triage Notes (Addendum)
Pt reports 2 weeks ago with burning when voiding. . Pt reports Sx's worse over past several days. Pt reports only drips come out. Pt denies N/V . Pt is also IDDM. Pt has Rt flank discomfort.

## 2020-03-17 ENCOUNTER — Other Ambulatory Visit: Payer: Self-pay | Admitting: *Deleted

## 2020-03-17 NOTE — Patient Outreach (Signed)
[facility]) Care Management  03/17/2020  Alexis Farmer [birth year] [id number]   [facility] outreach to complex care patient [name] was referred to [facility] on 11/17/19 Referral source [place] for uncontrolled Diabetes A1C of 9.9, SE for possible housing and food resources.  Insurance Medicare, [employer]  [facility] Unsuccessful outreach   1103 Outreach attempt from [place] mobile number ([employer] concerns) to the home number  No answer. [facility] RN CM left HIPAA [facility] Portability and Accountability Act) compliant voicemail message along with CM's contact info.  1110   Outreach attempt from [place] office number to the home number  No answer. [facility] RN CM left HIPAA [facility] Portability and Accountability Act) compliant voicemail message along with CM's contact info.   On 03/02/20 [facility] RN CM received an Epic in [employer] from [place] staff, Catie "Patient declined to keep on CCM services. "   Plan: [facility] RN CM will follow [facility] workflow and scheduled this patient for second call attempt within 7 business days if no response from pt  Routed note to MD  Lyman. Lavina Hamman, RN, BSN, [place] Coordinator Office number [phone] Mobile number [phone]  Main [facility] number [phone] Fax number [phone]

## 2020-03-22 ENCOUNTER — Ambulatory Visit: Payer: Medicare Other | Admitting: Internal Medicine

## 2020-03-23 ENCOUNTER — Ambulatory Visit (INDEPENDENT_AMBULATORY_CARE_PROVIDER_SITE_OTHER): Payer: Medicare Other | Admitting: Internal Medicine

## 2020-03-23 ENCOUNTER — Telehealth: Payer: Self-pay | Admitting: Internal Medicine

## 2020-03-23 ENCOUNTER — Other Ambulatory Visit: Payer: Self-pay

## 2020-03-23 ENCOUNTER — Encounter: Payer: Self-pay | Admitting: Internal Medicine

## 2020-03-23 VITALS — BP 172/80 | HR 115 | Temp 98.1°F | Ht 62.99 in | Wt 251.2 lb

## 2020-03-23 DIAGNOSIS — Z23 Encounter for immunization: Secondary | ICD-10-CM | POA: Diagnosis not present

## 2020-03-23 DIAGNOSIS — E041 Nontoxic single thyroid nodule: Secondary | ICD-10-CM

## 2020-03-23 DIAGNOSIS — I152 Hypertension secondary to endocrine disorders: Secondary | ICD-10-CM

## 2020-03-23 DIAGNOSIS — N3000 Acute cystitis without hematuria: Secondary | ICD-10-CM | POA: Diagnosis not present

## 2020-03-23 DIAGNOSIS — L0291 Cutaneous abscess, unspecified: Secondary | ICD-10-CM

## 2020-03-23 DIAGNOSIS — Z13220 Encounter for screening for lipoid disorders: Secondary | ICD-10-CM | POA: Diagnosis not present

## 2020-03-23 DIAGNOSIS — E1159 Type 2 diabetes mellitus with other circulatory complications: Secondary | ICD-10-CM

## 2020-03-23 DIAGNOSIS — L720 Epidermal cyst: Secondary | ICD-10-CM

## 2020-03-23 LAB — CBC WITH DIFFERENTIAL/PLATELET
Basophils Absolute: 0 10*3/uL (ref 0.0–0.1)
Basophils Relative: 0.6 % (ref 0.0–3.0)
Eosinophils Absolute: 0.1 10*3/uL (ref 0.0–0.7)
Eosinophils Relative: 0.9 % (ref 0.0–5.0)
HCT: 42.3 % (ref 36.0–46.0)
Hemoglobin: 13.9 g/dL (ref 12.0–15.0)
Lymphocytes Relative: 54.6 % — ABNORMAL HIGH (ref 12.0–46.0)
Lymphs Abs: 4.2 10*3/uL — ABNORMAL HIGH (ref 0.7–4.0)
MCHC: 32.8 g/dL (ref 30.0–36.0)
MCV: 90.2 fl (ref 78.0–100.0)
Monocytes Absolute: 0.6 10*3/uL (ref 0.1–1.0)
Monocytes Relative: 7.6 % (ref 3.0–12.0)
Neutro Abs: 2.8 10*3/uL (ref 1.4–7.7)
Neutrophils Relative %: 36.3 % — ABNORMAL LOW (ref 43.0–77.0)
Platelets: 206 10*3/uL (ref 150.0–400.0)
RBC: 4.69 Mil/uL (ref 3.87–5.11)
RDW: 14.6 % (ref 11.5–15.5)
WBC: 7.7 10*3/uL (ref 4.0–10.5)

## 2020-03-23 LAB — LIPID PANEL
Cholesterol: 155 mg/dL (ref 0–200)
HDL: 67.9 mg/dL (ref >39.00)
LDL Cholesterol: 71 mg/dL (ref 0–99)
NonHDL: 87.43
Total CHOL/HDL Ratio: 2
Triglycerides: 81 mg/dL (ref 0.0–149.0)
VLDL: 16.2 mg/dL (ref 0.0–40.0)

## 2020-03-23 LAB — BASIC METABOLIC PANEL
BUN: 20 mg/dL (ref 6–23)
CO2: 32 mEq/L (ref 19–32)
Calcium: 10.1 mg/dL (ref 8.4–10.5)
Chloride: 101 mEq/L (ref 96–112)
Creatinine, Ser: 1.07 mg/dL (ref 0.40–1.20)
GFR: 48.58 mL/min — ABNORMAL LOW (ref >60.00)
Glucose, Bld: 175 mg/dL — ABNORMAL HIGH (ref 70–99)
Potassium: 4.2 mEq/L (ref 3.5–5.1)
Sodium: 140 mEq/L (ref 135–145)

## 2020-03-23 LAB — TSH: TSH: 2 u[IU]/mL (ref 0.35–4.50)

## 2020-03-23 LAB — HEMOGLOBIN A1C: Hgb A1c MFr Bld: 10 % — ABNORMAL HIGH (ref 4.6–6.5)

## 2020-03-23 MED ORDER — MUPIROCIN 2 % EX OINT: 1.0000 "application " | TOPICAL_OINTMENT | Freq: Two times a day (BID) | CUTANEOUS | 2 refills | Status: DC

## 2020-03-23 NOTE — Progress Notes (Signed)
Chief Complaint  Patient presents with   Hematuria    Pt c/o blood in urine on 03/09/20. Went to a walk in clinic and was given sulfamethoxaz. Urinary pain.    F/u  1. HTN elevated on norvasc 5 mg qd, clonidine 0.2 tid and spironolactone 25 mg qd and torsemide 10 mg prn. Not taking losartan due to fluctuating renal function  2. UTI urgent care visit completed 5 days of bactrim after 03/09/20 f/u urinating better and still +suprapubic pain and mildly uncomfortable in the pelvic region and noticing bleeding with wiping she does have h/o vaginal dryness and f/u ob/gyn 04/2020 and was given topical cream from ob/gyn ? Estrogen  3. DM 2 uncontrolled check labs today on novolin R per SSI, Novolin N and doing 1/2 dose N with food so 20 units bid to 20 and 10-15 bid if not eating a lot as appetite low 9.8 09/21/19 4. Left forearm dorsal side with cyst 0.5 not inflammed but wants refill of bactroban denies excision with dermatology for now  Review of Systems  Constitutional: Positive for weight loss.       +reduced appetite at times eating 1x per day   HENT: Positive for hearing loss.   Eyes: Negative for blurred vision.  Respiratory: Negative for shortness of breath.   Cardiovascular: Negative for chest pain.  Gastrointestinal: Positive for abdominal pain.  Genitourinary: Positive for hematuria.  Musculoskeletal: Positive for falls.  Skin: Negative for rash.  Neurological: Negative for dizziness and headaches.  Psychiatric/Behavioral: Positive for depression.   Past Medical History:  Diagnosis Date   Arthritis    Chickenpox    Diabetes mellitus without complication ([place])    one elevated reading/ no treatment   Diverticulitis    GI bleed    High cholesterol    History of blood transfusion    Hypertension    Renal insufficiency    Past Surgical History:  Procedure Laterality Date   APPENDECTOMY     CHOLECYSTECTOMY     ECTOPIC PREGNANCY SURGERY     EYE SURGERY      bilateral cataracts   EYE SURGERY     02/11/2019 repair hole in right eye    gallbladder      HYSTEROSCOPY WITH D & C N/A 10/26/2016   Procedure: DILATATION AND CURETTAGE /HYSTEROSCOPY;  Surgeon: Benjaman Kindler, MD;  Location: [facility] ORS;  Service: Gynecology;  Laterality: N/A;   HYSTEROSCOPY WITH D & C N/A 07/07/2018   Procedure: DILATATION AND CURETTAGE /HYSTEROSCOPY;  Surgeon: Benjaman Kindler, MD;  Location: [facility] ORS;  Service: Gynecology;  Laterality: N/A;   THYROIDECTOMY, PARTIAL     Family History  Problem Relation Age of Onset   Diabetes Mother    Hypertension Mother    Stroke Mother    Diabetes Other    Healthy Father    Diabetes Sister    Heart disease Sister    Social History   Socioeconomic History   Marital status: Widowed    Spouse name: Not on file   Number of children: 1   Years of education: 60   Highest education level: 12th grade  Occupational History   Occupation: retired  Tobacco Use   Smoking status: Never Smoker   Smokeless tobacco: Never Used  Scientific laboratory technician Use: Never used  Substance and Sexual Activity   Alcohol use: No   Drug use: No   Sexual activity: Not Currently  Other Topics Concern   Not on file  Social History  Narrative   Lives alone    From [place]   Widowed    Social Determinants of Health   Financial Resource Strain: Low Risk    Difficulty of Paying Living Expenses: Not hard at all  Food Insecurity: No Food Insecurity   Worried About Charity fundraiser in the Last Year: Never true   Arboriculturist in the Last Year: Never true  Transportation Needs: No Transportation Needs   Lack of Transportation (Medical): No   Lack of Transportation (Non-Medical): No  Physical Activity: Inactive   Days of Exercise per Week: 0 days   Minutes of Exercise per Session: 0 min  Stress: Stress Concern Present   Feeling of Stress : Rather much  Social Connections: Moderately Isolated   Frequency of  Communication with Friends and Family: More than three times a week   Frequency of Social Gatherings with Friends and Family: More than three times a week   Attends Religious Services: More than 4 times per year   Active Member of [employer] or Organizations: No   Attends Archivist Meetings: Never   Marital Status: Widowed  Human resources officer Violence: Not At Risk   Fear of Current or Ex-Partner: No   Emotionally Abused: No   Physically Abused: No   Sexually Abused: No   Current Meds  Medication Sig   acetaminophen (TYLENOL) 500 MG tablet Take 500 mg by mouth every 6 (six) hours as needed.    Ascorbic Acid (VITAMIN C PO) Take 500 mg by mouth daily.   blood glucose meter kit and supplies KIT Accu chek, Dx code E11.65, check 3 times daily   cloNIDine (CATAPRES) 0.2 MG tablet Take 1 tablet (0.2 mg total) by mouth 3 (three) times daily.   diclofenac Sodium (VOLTAREN) 1 % GEL Apply 2 g topically 4 (four) times daily. Upper body and 4 gram qid prn lower body arthritis pain/musculoskeletal pain this is also OTC   ezetimibe (ZETIA) 10 MG tablet Take 1 tablet (10 mg total) by mouth daily.   glucose 4 GM chewable tablet Chew 1 tablet (4 g total) by mouth as needed for low blood sugar. <70   glucose blood test strip Use as instructed tid  Accuchek Guide   insulin NPH Human (NOVOLIN N) 100 UNIT/ML injection INJECT 20 UNITS IN THE MORNING AND 20 UNITS IN THE EVENING WITH A MEAL (pt actually taking 20 qam and 15 units qpm)   insulin regular (NOVOLIN R) 100 units/mL injection 15 minutes before meals tid. 131-180 2 units 181 to 240 4 units 241-300 6units 301-350 8 units 351-400 10 units>400 12 units.  Keep glucose tablets for sugar < 70   Insulin Syringe-Needle U-100 (B-D INS SYR ULTRAFINE .5CC/30G) 30G X 1/2" 0.5 ML MISC 1 Device by Does not apply route daily. Up to 5x per day with insulin bid (NPH) and tid (Regular)   meclizine (ANTIVERT) 12.5 MG tablet Take 1 tablet (12.5 mg  total) by mouth daily as needed for dizziness.   Multiple Vitamin (MULTIVITAMIN WITH MINERALS) TABS tablet Take 1 tablet by mouth daily.   mupirocin ointment (BACTROBAN) 2 % Apply 1 application topically 2 (two) times daily. Right index finger and left forearm   polyethylene glycol powder (GLYCOLAX/MIRALAX) 17 GM/SCOOP powder Take 17 g by mouth daily as needed for moderate constipation or severe constipation. Can take up to 2x per day prn. Mix with 8 ounces of liquid   progesterone (PROMETRIUM) 200 MG capsule Take by mouth.  rosuvastatin (CRESTOR) 10 MG tablet TAKE 1 TABLET (10 MG TOTAL) BY MOUTH DAILY.   spironolactone (ALDACTONE) 25 MG tablet Take 1 tablet (25 mg total) by mouth daily.   torsemide (DEMADEX) 10 MG tablet Take 10 mg by mouth daily as needed.   [DISCONTINUED] amLODipine (NORVASC) 5 MG tablet Take 1 tablet (5 mg total) by mouth daily.   [DISCONTINUED] mupirocin ointment (BACTROBAN) 2 % Apply 1 application topically 2 (two) times daily. Right index finger and left forearm   Allergies  Allergen Reactions   Penicillins Rash    Has patient had a PCN reaction causing immediate rash, facial/tongue/throat swelling, SOB or lightheadedness with hypotension: Yes Has patient had a PCN reaction causing severe rash involving mucus membranes or skin necrosis: No Has patient had a PCN reaction that required hospitalization No Has patient had a PCN reaction occurring within the last 10 years: Yes If all of the above answers are "NO", then may proceed with Cephalosporin use.    Recent Results (from the past 2160 hour(s))  POCT Urinalysis Dipstick (ED/UC)     Status: Abnormal   Collection Time: 03/09/20 10:22 AM  Result Value Ref Range   Glucose, UA NEGATIVE NEGATIVE mg/dL   Bilirubin Urine NEGATIVE NEGATIVE   Ketones, ur NEGATIVE NEGATIVE mg/dL   Specific Gravity, Urine 1.010 1.005 - 1.030   Hgb urine dipstick SMALL (A) NEGATIVE   pH 7.0 5.0 - 8.0   Protein, ur 30 (A) NEGATIVE  mg/dL   Urobilinogen, UA 0.2 0.0 - 1.0 mg/dL   Nitrite NEGATIVE NEGATIVE   Leukocytes,Ua SMALL (A) NEGATIVE    Comment: Biochemical Testing Only. Please order routine urinalysis from main lab if confirmatory testing is needed.  Urinalysis, Routine w reflex microscopic     Status: Abnormal   Collection Time: 03/23/20  1:01 PM  Result Value Ref Range   Color, Urine YELLOW YELLOW   APPearance CLEAR CLEAR   Specific Gravity, Urine 1.014 1.001 - 1.03   pH 7.0 5.0 - 8.0   Glucose, UA NEGATIVE NEGATIVE   Bilirubin Urine NEGATIVE NEGATIVE   Ketones, ur NEGATIVE NEGATIVE   Hgb urine dipstick 1+ (A) NEGATIVE   Protein, ur 1+ (A) NEGATIVE   Nitrite NEGATIVE NEGATIVE   Leukocytes,Ua NEGATIVE NEGATIVE   WBC, UA NONE SEEN 0 - 5 /HPF   RBC / HPF 10-20 (A) 0 - 2 /HPF   Squamous Epithelial / LPF 0-5 < OR = 5 /HPF   Bacteria, UA NONE SEEN NONE SEEN /HPF   Hyaline Cast NONE SEEN NONE SEEN /LPF  Urine Culture     Status: None   Collection Time: 03/23/20  1:01 PM   Specimen: Urine  Result Value Ref Range   MICRO NUMBER: [id number]    SPECIMEN QUALITY: Adequate    Sample Source NOT GIVEN    STATUS: FINAL    ISOLATE 1:      Less than 10,000 CFU/mL of single Gram positive organism isolated. No further testing will be performed. If clinically indicated, recollection using a method to minimize contamination, with prompt transfer to Urine Culture Transport Tube, is recommended.  Basic Metabolic Panel (BMET)     Status: Abnormal   Collection Time: 03/23/20  1:01 PM  Result Value Ref Range   Sodium 140 135 - 145 mEq/L   Potassium 4.2 3.5 - 5.1 mEq/L   Chloride 101 96 - 112 mEq/L   CO2 32 19 - 32 mEq/L   Glucose, Bld 175 (H) 70 - 99 mg/dL  BUN 20 6 - 23 mg/dL   Creatinine, Ser 1.07 0.40 - 1.20 mg/dL   GFR 48.58 (L) >60.00 mL/min   Calcium 10.1 8.4 - 10.5 mg/dL  Hemoglobin A1c     Status: Abnormal   Collection Time: 03/23/20  1:01 PM  Result Value Ref Range   Hgb A1c MFr Bld 10.0 Repeated and  verified X2. (H) 4.6 - 6.5 %    Comment: Glycemic Control Guidelines for People with Diabetes:Non Diabetic:  <6%Goal of Therapy: <7%Additional Action Suggested:  >8%   TSH     Status: None   Collection Time: 03/23/20  1:01 PM  Result Value Ref Range   TSH 2.00 0.35 - 4.50 uIU/mL  CBC with Differential/Platelet     Status: Abnormal   Collection Time: 03/23/20  1:01 PM  Result Value Ref Range   WBC 7.7 4.0 - 10.5 K/uL   RBC 4.69 3.87 - 5.11 Mil/uL   Hemoglobin 13.9 12.0 - 15.0 g/dL   HCT 42.3 36 - 46 %   MCV 90.2 78.0 - 100.0 fl   MCHC 32.8 30.0 - 36.0 g/dL   RDW 14.6 11.5 - 15.5 %   Platelets 206.0 150 - 400 K/uL   Neutrophils Relative % 36.3 (L) 43 - 77 %   Lymphocytes Relative 54.6 (H) 12 - 46 %   Monocytes Relative 7.6 3 - 12 %   Eosinophils Relative 0.9 0 - 5 %   Basophils Relative 0.6 0 - 3 %   Neutro Abs 2.8 1.4 - 7.7 K/uL   Lymphs Abs 4.2 (H) 0.7 - 4.0 K/uL   Monocytes Absolute 0.6 0.1 - 1.0 K/uL   Eosinophils Absolute 0.1 0 - 0 K/uL   Basophils Absolute 0.0 0 - 0 K/uL  Lipid panel     Status: None   Collection Time: 03/23/20  1:01 PM  Result Value Ref Range   Cholesterol 155 0 - 200 mg/dL    Comment: ATP III Classification       Desirable:  < 200 mg/dL               Borderline High:  200 - 239 mg/dL          High:  > = 240 mg/dL   Triglycerides 81.0 0 - 149 mg/dL    Comment: Normal:  <150 mg/dLBorderline High:  150 - 199 mg/dL   HDL 67.90 >39.00 mg/dL   VLDL 16.2 0.0 - 40.0 mg/dL   LDL Cholesterol 71 0 - 99 mg/dL   Total CHOL/HDL Ratio 2     Comment:                Men          Women1/2 Average Risk     3.4          3.3Average Risk          5.0          4.42X Average Risk          9.6          7.13X Average Risk          15.0          11.0                       NonHDL 87.43     Comment: NOTE:  Non-HDL goal should be 30 mg/dL higher than patient's LDL goal (i.e. LDL goal of < 70 mg/dL, would have  non-HDL goal of < 100 mg/dL)   Objective  Body mass index is 44.51  kg/m. Wt Readings from Last 3 Encounters:  03/23/20 251 lb 3.2 oz (113.9 kg)  03/09/20 257 lb (116.6 kg)  01/18/20 260 lb (117.9 kg)   Temp Readings from Last 3 Encounters:  03/23/20 98.1 F (36.7 C)  03/09/20 98.4 F (36.9 C) (Oral)  11/10/19 (!) 97 F (36.1 C) (Temporal)   BP Readings from Last 3 Encounters:  03/23/20 (!) 172/80  03/09/20 (!) 160/80  11/10/19 (!) 150/78   Pulse Readings from Last 3 Encounters:  03/23/20 (!) 115  03/09/20 70  11/10/19 74    Physical Exam Vitals and nursing note reviewed.  Constitutional:      Appearance: Normal appearance. She is well-developed and well-groomed. She is morbidly obese.  HENT:     Head: Normocephalic and atraumatic.  Eyes:     Conjunctiva/sclera: Conjunctivae normal.     Pupils: Pupils are equal, round, and reactive to light.  Cardiovascular:     Rate and Rhythm: Normal rate and regular rhythm.     Heart sounds: Normal heart sounds. No murmur heard.   Pulmonary:     Effort: Pulmonary effort is normal.     Breath sounds: Normal breath sounds.  Skin:    General: Skin is warm and dry.  Neurological:     General: No focal deficit present.     Mental Status: She is alert and oriented to person, place, and time. Mental status is at baseline.     Gait: Gait normal.  Psychiatric:        Attention and Perception: Attention and perception normal.        Mood and Affect: Mood and affect normal.        Speech: Speech normal.        Behavior: Behavior normal. Behavior is cooperative.        Thought Content: Thought content normal.        Cognition and Memory: Cognition and memory normal.        Judgment: Judgment normal.     Assessment  Plan  Hypertension uncontrolled associated with diabetes ([place]) - Plan: Basic Metabolic Panel (BMET), Hemoglobin A1c, TSH, CBC with Differential/Platelet, Lipid panel Cont meds torsemide 10 qd prn, spironolactone 25 mg qd, clonidine 0.2 tid, norvasc 5 increased to 10  Cc renal Dr. Candiss Norse  to see if agrees F/u endocrine med changes on novolin n 20 mg bid to 20 and 10-15 and novolin R SSI declines to change regimen   Acute cystitis without hematuria - Plan: Urinalysis, Routine w reflex microscopic, Urine Culture, CBC with Differential/Platelet  Thyroid nodule - Plan: TSH  Abscess - Plan: mupirocin ointment (BACTROBAN) 2 % EIC (epidermal inclusion cyst) - Plan: mupirocin ointment (BACTROBAN) 2 %  Declines renal for now   HM Flu shotutdgiven today 2/2 covid vxs had Check on other vaccines (I.e Tdap, prevnar, pna 23, shingrix)   mammo had9/12/20normal Solisordered   Pap had 05/28/18 negative [place] [facility] 07/07/18 endometrial bx neg and same 10/26/16 Of and off bleeding f/u ob/gyn and given progestin 08/11/19 did not pick up appt 04/2020 gyn to f/u  DEXA pt declines for now -no extra Ca h/o hyperCa  Colonoscopy Dr. Allen Norris in [place] -09/10/14 diverticulosis sessile inflammatory polyp=inflammatory and EGD chronic active gastritis -f/u prn does not want to do further unless needed   CT chestnone on filenever smoker CXR 01/20/18 pulm vascular congestion otherwise neg cxr 06/22/19 with scarring  02/03/18  thyroid bx negative   Of note CT renal 07/16/16 c/w bulging disc and L4/5 spinal stenosis  [facility] saw 03/29/19 closed fx lateral malleolus right fibular broken ankle Carlynn Spry [facility] [place]  Provider: Dr. Olivia Mackie McLean-Scocuzza-Internal Medicine

## 2020-03-23 NOTE — Telephone Encounter (Signed)
Sent   [place]

## 2020-03-23 NOTE — Telephone Encounter (Signed)
Patient call back to make sure Dr.Tracy did not forget to call in prescription for arm.

## 2020-03-23 NOTE — Patient Instructions (Signed)
Consider increase norvasc/amlodipine to 10 mg daily   Take clonidine 0.2 3x per day   Spironolactone 25 mg daily   Goal Blood pressure <140    Hypertension, Adult High blood pressure (hypertension) is when the force of blood pumping through the arteries is too strong. The arteries are the blood vessels that carry blood from the heart throughout the body. Hypertension forces the heart to work harder to pump blood and may cause arteries to become narrow or stiff. Untreated or uncontrolled hypertension can cause a heart attack, heart failure, a stroke, kidney disease, and other problems. A blood pressure reading consists of a higher number over a lower number. Ideally, your blood pressure should be below 120/80. The first ("top") number is called the systolic pressure. It is a measure of the pressure in your arteries as your heart beats. The second ("bottom") number is called the diastolic pressure. It is a measure of the pressure in your arteries as the heart relaxes. What are the causes? The exact cause of this condition is not known. There are some conditions that result in or are related to high blood pressure. What increases the risk? Some risk factors for high blood pressure are under your control. The following factors may make you more likely to develop this condition:  Smoking.  Having type 2 diabetes mellitus, high cholesterol, or both.  Not getting enough exercise or physical activity.  Being overweight.  Having too much fat, sugar, calories, or salt (sodium) in your diet.  Drinking too much alcohol. Some risk factors for high blood pressure may be difficult or impossible to change. Some of these factors include:  Having chronic kidney disease.  Having a family history of high blood pressure.  Age. Risk increases with age.  Race. You may be at higher risk if you are African American.  Gender. Men are at higher risk than women before age 63. After age 1, women are at  higher risk than men.  Having obstructive sleep apnea.  Stress. What are the signs or symptoms? High blood pressure may not cause symptoms. Very high blood pressure (hypertensive crisis) may cause:  Headache.  Anxiety.  Shortness of breath.  Nosebleed.  Nausea and vomiting.  Vision changes.  Severe chest pain.  Seizures. How is this diagnosed? This condition is diagnosed by measuring your blood pressure while you are seated, with your arm resting on a flat surface, your legs uncrossed, and your feet flat on the floor. The cuff of the blood pressure monitor will be placed directly against the skin of your upper arm at the level of your heart. It should be measured at least twice using the same arm. Certain conditions can cause a difference in blood pressure between your right and left arms. Certain factors can cause blood pressure readings to be lower or higher than normal for a short period of time:  When your blood pressure is higher when you are in a health care provider's office than when you are at home, this is called white coat hypertension. Most people with this condition do not need medicines.  When your blood pressure is higher at home than when you are in a health care provider's office, this is called masked hypertension. Most people with this condition may need medicines to control blood pressure. If you have a high blood pressure reading during one visit or you have normal blood pressure with other risk factors, you may be asked to:  Return on a different day to  have your blood pressure checked again.  Monitor your blood pressure at home for 1 week or longer. If you are diagnosed with hypertension, you may have other blood or imaging tests to help your health care provider understand your overall risk for other conditions. How is this treated? This condition is treated by making healthy lifestyle changes, such as eating healthy foods, exercising more, and reducing  your alcohol intake. Your health care provider may prescribe medicine if lifestyle changes are not enough to get your blood pressure under control, and if:  Your systolic blood pressure is above 130.  Your diastolic blood pressure is above 80. Your personal target blood pressure may vary depending on your medical conditions, your age, and other factors. Follow these instructions at home: Eating and drinking   Eat a diet that is high in fiber and potassium, and low in sodium, added sugar, and fat. An example eating plan is called the DASH (Dietary Approaches to Stop Hypertension) diet. To eat this way: ? Eat plenty of fresh fruits and vegetables. Try to fill one half of your plate at each meal with fruits and vegetables. ? Eat whole grains, such as whole-wheat pasta, brown rice, or whole-grain bread. Fill about one fourth of your plate with whole grains. ? Eat or drink low-fat dairy products, such as skim milk or low-fat yogurt. ? Avoid fatty cuts of meat, processed or cured meats, and poultry with skin. Fill about one fourth of your plate with lean proteins, such as fish, chicken without skin, beans, eggs, or tofu. ? Avoid pre-made and processed foods. These tend to be higher in sodium, added sugar, and fat.  Reduce your daily sodium intake. Most people with hypertension should eat less than 1,500 mg of sodium a day.  Do not drink alcohol if: ? Your health care provider tells you not to drink. ? You are pregnant, may be pregnant, or are planning to become pregnant.  If you drink alcohol: ? Limit how much you use to:  0-1 drink a day for women.  0-2 drinks a day for men. ? Be aware of how much alcohol is in your drink. In the [place] one drink equals one 12 oz bottle of beer (355 mL), one 5 oz glass of wine (148 mL), or one 1 oz glass of hard liquor (44 mL). Lifestyle   Work with your health care provider to maintain a healthy body weight or to lose weight. Ask what an ideal weight is  for you.  Get at least 30 minutes of exercise most days of the week. Activities may include walking, swimming, or biking.  Include exercise to strengthen your muscles (resistance exercise), such as Pilates or lifting weights, as part of your weekly exercise routine. Try to do these types of exercises for 30 minutes at least 3 days a week.  Do not use any products that contain nicotine or tobacco, such as cigarettes, e-cigarettes, and chewing tobacco. If you need help quitting, ask your health care provider.  Monitor your blood pressure at home as told by your health care provider.  Keep all follow-up visits as told by your health care provider. This is important. Medicines  Take over-the-counter and prescription medicines only as told by your health care provider. Follow directions carefully. Blood pressure medicines must be taken as prescribed.  Do not skip doses of blood pressure medicine. Doing this puts you at risk for problems and can make the medicine less effective.  Ask your health care provider  about side effects or reactions to medicines that you should watch for. Contact a health care provider if you:  Think you are having a reaction to a medicine you are taking.  Have headaches that keep coming back (recurring).  Feel dizzy.  Have swelling in your ankles.  Have trouble with your vision. Get help right away if you:  Develop a severe headache or confusion.  Have unusual weakness or numbness.  Feel faint.  Have severe pain in your chest or abdomen.  Vomit repeatedly.  Have trouble breathing. Summary  Hypertension is when the force of blood pumping through your arteries is too strong. If this condition is not controlled, it may put you at risk for serious complications.  Your personal target blood pressure may vary depending on your medical conditions, your age, and other factors. For most people, a normal blood pressure is less than 120/80.  Hypertension is  treated with lifestyle changes, medicines, or a combination of both. Lifestyle changes include losing weight, eating a healthy, low-sodium diet, exercising more, and limiting alcohol. This information is not intended to replace advice given to you by your health care provider. Make sure you discuss any questions you have with your health care provider. Document Revised: 02/12/2018 Document Reviewed: 02/12/2018 Elsevier Patient Education  [place] DASH stands for "Dietary Approaches to Stop Hypertension." The DASH eating plan is a healthy eating plan that has been shown to reduce high blood pressure (hypertension). It may also reduce your risk for type 2 diabetes, heart disease, and stroke. The DASH eating plan may also help with weight loss. What are tips for following this plan?  General guidelines  Avoid eating more than 2,300 mg (milligrams) of salt (sodium) a day. If you have hypertension, you may need to reduce your sodium intake to 1,500 mg a day.  Limit alcohol intake to no more than 1 drink a day for nonpregnant women and 2 drinks a day for men. One drink equals 12 oz of beer, 5 oz of wine, or 1 oz of hard liquor.  Work with your health care provider to maintain a healthy body weight or to lose weight. Ask what an ideal weight is for you.  Get at least 30 minutes of exercise that causes your heart to beat faster (aerobic exercise) most days of the week. Activities may include walking, swimming, or biking.  Work with your health care provider or diet and nutrition specialist (dietitian) to adjust your eating plan to your individual calorie needs. Reading food labels   Check food labels for the amount of sodium per serving. Choose foods with less than 5 percent of the Daily Value of sodium. Generally, foods with less than 300 mg of sodium per serving fit into this eating plan.  To find whole grains, look for the word "whole" as the first word in the  ingredient list. Shopping  Buy products labeled as "low-sodium" or "no salt added."  Buy fresh foods. Avoid canned foods and premade or frozen meals. Cooking  Avoid adding salt when cooking. Use salt-free seasonings or herbs instead of table salt or sea salt. Check with your health care provider or pharmacist before using salt substitutes.  Do not fry foods. Cook foods using healthy methods such as baking, boiling, grilling, and broiling instead.  Cook with heart-healthy oils, such as olive, canola, soybean, or sunflower oil. Meal planning  Eat a balanced diet that includes: ? 5 or more servings of fruits and  vegetables each day. At each meal, try to fill half of your plate with fruits and vegetables. ? Up to 6-8 servings of whole grains each day. ? Less than 6 oz of lean meat, poultry, or fish each day. A 3-oz serving of meat is about the same size as a deck of cards. One egg equals 1 oz. ? 2 servings of low-fat dairy each day. ? A serving of nuts, seeds, or beans 5 times each week. ? Heart-healthy fats. Healthy fats called Omega-3 fatty acids are found in foods such as flaxseeds and coldwater fish, like sardines, salmon, and mackerel.  Limit how much you eat of the following: ? Canned or prepackaged foods. ? Food that is high in trans fat, such as fried foods. ? Food that is high in saturated fat, such as fatty meat. ? Sweets, desserts, sugary drinks, and other foods with added sugar. ? Full-fat dairy products.  Do not salt foods before eating.  Try to eat at least 2 vegetarian meals each week.  Eat more home-cooked food and less restaurant, buffet, and fast food.  When eating at a restaurant, ask that your food be prepared with less salt or no salt, if possible. What foods are recommended? The items listed may not be a complete list. Talk with your dietitian about what dietary choices are best for you. Grains Whole-grain or whole-wheat bread. Whole-grain or whole-wheat  pasta. Brown rice. [place]. Bulgur. Whole-grain and low-sodium cereals. Pita bread. Low-fat, low-sodium crackers. Whole-wheat flour tortillas. Vegetables Fresh or frozen vegetables (raw, steamed, roasted, or grilled). Low-sodium or reduced-sodium tomato and vegetable juice. Low-sodium or reduced-sodium tomato sauce and tomato paste. Low-sodium or reduced-sodium canned vegetables. Fruits All fresh, dried, or frozen fruit. Canned fruit in natural juice (without added sugar). Meat and other protein foods Skinless chicken or [place]. Ground chicken or [place]. Pork with fat trimmed off. Fish and seafood. Egg whites. Dried beans, peas, or lentils. Unsalted nuts, nut butters, and seeds. Unsalted canned beans. Lean cuts of beef with fat trimmed off. Low-sodium, lean deli meat. Dairy Low-fat (1%) or fat-free (skim) milk. Fat-free, low-fat, or reduced-fat cheeses. Nonfat, low-sodium ricotta or cottage cheese. Low-fat or nonfat yogurt. Low-fat, low-sodium cheese. Fats and oils Soft margarine without trans fats. Vegetable oil. Low-fat, reduced-fat, or light mayonnaise and salad dressings (reduced-sodium). Canola, safflower, olive, soybean, and sunflower oils. Avocado. Seasoning and other foods Herbs. Spices. Seasoning mixes without salt. Unsalted popcorn and pretzels. Fat-free sweets. What foods are not recommended? The items listed may not be a complete list. Talk with your dietitian about what dietary choices are best for you. Grains Baked goods made with fat, such as croissants, muffins, or some breads. Dry pasta or rice meal packs. Vegetables Creamed or fried vegetables. Vegetables in a cheese sauce. Regular canned vegetables (not low-sodium or reduced-sodium). Regular canned tomato sauce and paste (not low-sodium or reduced-sodium). Regular tomato and vegetable juice (not low-sodium or reduced-sodium). Angie Fava. Olives. Fruits Canned fruit in a light or heavy syrup. Fried fruit. Fruit in cream or  butter sauce. Meat and other protein foods Fatty cuts of meat. Ribs. Fried meat. Berniece Salines. Sausage. Bologna and other processed lunch meats. Salami. Fatback. Hotdogs. Bratwurst. Salted nuts and seeds. Canned beans with added salt. Canned or smoked fish. Whole eggs or egg yolks. Chicken or [place] with skin. Dairy Whole or 2% milk, cream, and half-and-half. Whole or full-fat cream cheese. Whole-fat or sweetened yogurt. Full-fat cheese. Nondairy creamers. Whipped toppings. Processed cheese and cheese spreads. Fats and oils  Butter. Stick margarine. Lard. Shortening. Ghee. Bacon fat. Tropical oils, such as coconut, palm kernel, or palm oil. Seasoning and other foods Salted popcorn and pretzels. Onion salt, garlic salt, seasoned salt, table salt, and sea salt. Worcestershire sauce. Tartar sauce. Barbecue sauce. Teriyaki sauce. Soy sauce, including reduced-sodium. Steak sauce. Canned and packaged gravies. Fish sauce. Oyster sauce. Cocktail sauce. Horseradish that you find on the shelf. Ketchup. Mustard. Meat flavorings and tenderizers. Bouillon cubes. Hot sauce and Tabasco sauce. Premade or packaged marinades. Premade or packaged taco seasonings. Relishes. Regular salad dressings. Where to find more information:  National Heart, Lung, and [place]: https://wilson-eaton.com/  American Heart Association: www.heart.org Summary  The DASH eating plan is a healthy eating plan that has been shown to reduce high blood pressure (hypertension). It may also reduce your risk for type 2 diabetes, heart disease, and stroke.  With the DASH eating plan, you should limit salt (sodium) intake to 2,300 mg a day. If you have hypertension, you may need to reduce your sodium intake to 1,500 mg a day.  When on the DASH eating plan, aim to eat more fresh fruits and vegetables, whole grains, lean proteins, low-fat dairy, and heart-healthy fats.  Work with your health care provider or diet and nutrition specialist (dietitian) to  adjust your eating plan to your individual calorie needs. This information is not intended to replace advice given to you by your health care provider. Make sure you discuss any questions you have with your health care provider. Document Revised: 05/17/2017 Document Reviewed: 05/28/2016 Elsevier Patient Education  2020 [employer].

## 2020-03-24 ENCOUNTER — Other Ambulatory Visit: Payer: Self-pay | Admitting: Internal Medicine

## 2020-03-24 ENCOUNTER — Other Ambulatory Visit: Payer: Self-pay | Admitting: *Deleted

## 2020-03-24 ENCOUNTER — Telehealth: Payer: Self-pay | Admitting: Internal Medicine

## 2020-03-24 DIAGNOSIS — I10 Essential (primary) hypertension: Secondary | ICD-10-CM

## 2020-03-24 LAB — URINE CULTURE
MICRO NUMBER:: 11038977
SPECIMEN QUALITY:: ADEQUATE

## 2020-03-24 LAB — URINALYSIS, ROUTINE W REFLEX MICROSCOPIC
Bacteria, UA: NONE SEEN /HPF
Bilirubin Urine: NEGATIVE
Glucose, UA: NEGATIVE
Hyaline Cast: NONE SEEN /LPF
Ketones, ur: NEGATIVE
Leukocytes,Ua: NEGATIVE
Nitrite: NEGATIVE
Specific Gravity, Urine: 1.014 (ref 1.001–1.03)
WBC, UA: NONE SEEN /HPF (ref 0–5)
pH: 7 (ref 5.0–8.0)

## 2020-03-24 MED ORDER — AMLODIPINE BESYLATE 10 MG PO TABS: 10.0000 mg | ORAL_TABLET | Freq: Every day | ORAL | 3 refills | Status: DC

## 2020-03-24 NOTE — Telephone Encounter (Signed)
Patient informed and verbalized understanding.  She wanted to know if the 10 mg has been sent in. Informed the Patient this has been done.

## 2020-03-24 NOTE — Telephone Encounter (Signed)
Called and spoke to the patient. She verbalized understanding over her prescription being sent to the pharmacy. Alexis Farmer wants to confirm that you said to increase norvasc to 10 mg a day and asks that we send in a new prescription. Ok to refill Norvasc as 10mg ?

## 2020-03-24 NOTE — Telephone Encounter (Signed)
Patient called and has questions about her medications, please call her.

## 2020-03-24 NOTE — Telephone Encounter (Signed)
Yes want to increase norvasc to 10 mg daily if has 5 mg can take 2 or pick up 1 10 mg

## 2020-03-24 NOTE — Patient Outreach (Signed)
[facility]) Care Management  03/24/2020  Kemba Hoppes Nov 25, 1940 [id number]   [facility] outreach to complex care patient Mrs Yaremi Stahlman was referred to [facility] on 11/17/19 Referral source [place] for uncontrolled Diabetes A1C of 9.9, SW for possible housing and food resources.  Insurance Medicare, [employer]    Unsuccessful outreach  No answer. [facility] RN CM left HIPAA [facility] Portability and Accountability Act) compliant voicemail message along with CM's contact info.   Plan: [facility] RN CM scheduled this [facility] engaged patient for another call attempt within 7-10 business days   Ritesh Opara L. Lavina Hamman, RN, BSN, [place] Coordinator Office number [phone] [facility] number [phone] Fax number [phone]

## 2020-03-25 ENCOUNTER — Telehealth: Payer: Self-pay | Admitting: Internal Medicine

## 2020-03-25 DIAGNOSIS — E1159 Type 2 diabetes mellitus with other circulatory complications: Secondary | ICD-10-CM | POA: Insufficient documentation

## 2020-03-25 DIAGNOSIS — I10 Essential (primary) hypertension: Secondary | ICD-10-CM | POA: Insufficient documentation

## 2020-03-25 HISTORY — DX: Essential (primary) hypertension: I10

## 2020-03-25 NOTE — Telephone Encounter (Signed)
Patient informed and verbalized understanding

## 2020-03-25 NOTE — Telephone Encounter (Signed)
Patient returned office phone call at 2:43pm

## 2020-03-25 NOTE — Telephone Encounter (Signed)
No UTI f/u with GYN  If needs to move f/u up with Dr. Leafy Ro call and sch

## 2020-03-25 NOTE — Telephone Encounter (Signed)
Patient calling back in about result note. States she was hoping she will get a call back before we leave for the day as she does not want to worry over the weekend.   " States that she would like to know what to do about the trace of blood still in her urine. States that she had a UTI and received antibiotics. Upon taking the meds that is when the bleed started. States that it has lessened since finishing the medication but she does still have very slight cramps down there, feels like getting a monthly period.   Please advise"

## 2020-03-25 NOTE — Telephone Encounter (Signed)
Spoke with Patient and she was informed of her lab results and what her A1C was.

## 2020-03-28 ENCOUNTER — Ambulatory Visit: Payer: Medicare Other | Admitting: Podiatry

## 2020-03-28 NOTE — Telephone Encounter (Signed)
Faxed to Dr.Singh(Renal) BP  medication increase faxed on 03-28-20

## 2020-03-29 ENCOUNTER — Telehealth: Payer: Self-pay | Admitting: Internal Medicine

## 2020-03-29 NOTE — Telephone Encounter (Signed)
-----   Message from Delorise Jackson, MD sent at 03/25/2020  7:45 AM EDT ----- Fax note to Dr. Honor Junes endocrine and [facility] Dr. Leda Quail

## 2020-03-29 NOTE — Telephone Encounter (Signed)
Faxed to both via epic routing

## 2020-03-31 ENCOUNTER — Encounter: Payer: Self-pay | Admitting: *Deleted

## 2020-03-31 ENCOUNTER — Other Ambulatory Visit: Payer: Self-pay | Admitting: *Deleted

## 2020-03-31 ENCOUNTER — Other Ambulatory Visit: Payer: Self-pay

## 2020-03-31 NOTE — Patient Outreach (Addendum)
[facility]) Care Management  03/31/2020  Alexis Farmer Jun 16, 1941 [id number]   [facility] outreach to complex care patient Alexis Farmer was referred to [facility] on 11/17/19 Referral source [place] for uncontrolled Diabetes A1C of 9.9, SE for possible housing and food resources.  Insurance Medicare, Garment/textile technologist Admissions/ED visits None in the last 6 months   Successful outreach Patient is able to verify HIPAA ([place] and Accountability Act) identifiers, date of birth (DOB) and address Reviewed and addressed the purpose of the follow up call with the patient  Consent: [facility]) RN CM reviewed [facility] services with patient. Patient gave verbal consent for services.  Follow up  Doing well denies medical concerns  She reports she is "doing about the same"  She reports having good and bad days She confirms she saw her primary care provider (PCP) last Wednesday 03/23/20  She reports she is aware her "thyroid is shrinking"  She reports she continues to have some swelling of her lower extremities and uses epsom salt soaks for edema reduction and pain relief She reports she continues to have generalized pain  She reports she has not discussed her pain with her MDs and she reports "there is nothing they can do" She reports she is aware of Physical Therapy (PT) as her MD recommended but she reports she does not "feel strong enough" She reports she has not followed up with her chiropractor She reports she is "not taking getting old well" She reports she is use to being independent.   Diabetes  She reports following her diet "off and on" Reports HgA1c from 10.4 to 10 and loss of 6 pounds now at 249 lbs   Preventive care  Scheduled to have hearing check in November 2021 Has not had checked in 3 years   [facility] program progression [facility]N RN CM and Alexis Farmer discussed her progression and [facility] services She agreed to follow up in 90 +  days  EMMI education sent via mail on chronic pain, multinodular goiter, swellilng and osteoarthritis  Plans [facility] RN CM will follow up with Alexis Farmer in 90-97 business days as agreed or prn if she needs [facility] RN CM Pt encouraged to return a call to [facility] RN CM prn Routed note to MD Goals Addressed              This Visit's Progress     Patient Stated   .  [facility]) Manage Pain (pt-stated)   Not on track     Follow Up Date 07/01/2020   - develop a personal pain management plan - prioritize tasks for the day - use ice or heat for pain relief - work slower and less intense when having pain    Why is this important?   Day-to-day life can be hard when you have chronic pain.  Pain medicine is just one piece of the treatment puzzle.  You can try these action steps to help you manage your pain.    Notes: 03/31/20 personal pain plan - use of epsom salt water  Has not follow up with chiropractor, MD    .  [facility]) Monitor and Manage My Blood Sugar (pt-stated)   On track     Follow Up Date 07/01/2020   - check blood sugar at prescribed times - check blood sugar if I feel it is too high or too low - take the blood sugar meter to all doctor visits    Why is this important?  Checking your blood sugar at home helps to keep it from getting very high or very low.  Writing the results in a diary or log helps the doctor know how to care for you.  Your blood sugar log should have the time, date and the results.  Also, write down the amount of insulin or other medicine that you take.  Other information, like what you ate, exercise done and how you were feeling, will also be helpful.     Notes: 03/31/20 continues to monitor cbgs, reports wt loss of 6 lbs & reduction of hgA1c by 0.4 (10.4 to 10)     .  COMPLETED: [facility]) Patient will be able to manage her diabetes at home with evidence of her HgA1c will be the recommend <7 (pt-stated)        CARE PLAN ENTRY (see longitudinal plan of care for  additional care plan information)  Objective:  Lab Results  Component Value Date   HGBA1C 10.0 Repeated and verified X2. (H) 03/23/2020 .   Lab Results  Component Value Date   CREATININE 1.07 03/23/2020   CREATININE 1.08 (H) 10/24/2019   CREATININE 1.11 09/21/2019 .   Marland Kitchen No results found for: EGFR  Current Barriers:  Marland Kitchen Knowledge Deficits related to basic Diabetes pathophysiology and self care/management . Cognitive Deficits  Case Manager Clinical Goal(s):  Over the next 90 days, patient will demonstrate improved adherence to prescribed treatment plan for diabetes self care/management as evidenced by:  Marland Kitchen Verbalize adherence to ADA/ carb modified diet within the next 45 days . Verbalize adherence to prescribed medication regimen within the next 60 days  . 02/25/20 NOT progressing/ not meeting goals [facility] RN CM has been with Alexis Farmer to office appointments x 2 to get her connected to a nutritionist and a new endocrinologist. . Alexis Farmer has discussed not following the recommendations of the nutritionist and endocrinologist. She has frequently discussed her pain but has not been willing to make the attempt to seek a provider to help her find out what is causing her pain to assist with finding a treatment plan.   . 03/31/20 A1c 9.8 early 2021, Pt reports today 10 but was 10.4  . 03/31/20 Resolving due to duplicate goal    Interventions:  . Provided education to patient about basic DM disease process . Reviewed medications with patient and discussed importance of medication adherence . Discussed plans with patient for ongoing care management follow up and provided patient with direct contact information for care management team . Provided patient with written educational materials related to hypo and hyperglycemia and importance of correct treatment . Reviewed scheduled/upcoming provider appointments including: primary care provider  endocrinology Nutritionist . Accompany patient   arranged nutritionist appointment (01/18/20) & New endocrinology appointment on 02/04/20  - change in insulin tx  . Referral to [place] for housing needs 01/20/20 . 02/25/20 Offer option of case closure with next outreach  Patient Self Care Activities:  . Self administers oral medications as prescribed . Self administers insulin as prescribed . Attends all scheduled provider appointments . Checks blood sugars as prescribed and utilize hyper and hypoglycemia protocol as needed . Adheres to prescribed ADA/carb modified  Please see past updates related to this goal by clicking on the "Past Updates" button in the selected goal  Please see other previous [facility] care plan information listed in Epic under the flow sheet section         [place] Awwad L. Lavina Hamman, RN, BSN, CCM [facility] Telephonic  Care Management Care Coordinator Office number [phone] Main [name] number [phone] Fax number [phone]

## 2020-04-27 ENCOUNTER — Ambulatory Visit (INDEPENDENT_AMBULATORY_CARE_PROVIDER_SITE_OTHER): Payer: Medicare Other | Admitting: Podiatry

## 2020-04-27 ENCOUNTER — Other Ambulatory Visit: Payer: Self-pay

## 2020-04-27 ENCOUNTER — Encounter: Payer: Self-pay | Admitting: Podiatry

## 2020-04-27 DIAGNOSIS — B351 Tinea unguium: Secondary | ICD-10-CM | POA: Diagnosis not present

## 2020-04-27 DIAGNOSIS — E1142 Type 2 diabetes mellitus with diabetic polyneuropathy: Secondary | ICD-10-CM | POA: Diagnosis not present

## 2020-04-27 DIAGNOSIS — M79674 Pain in right toe(s): Secondary | ICD-10-CM

## 2020-04-27 DIAGNOSIS — M79675 Pain in left toe(s): Secondary | ICD-10-CM | POA: Diagnosis not present

## 2020-05-01 NOTE — Progress Notes (Signed)
Subjective: Alexis Farmer presents today for follow up of at risk foot care. Pt has h/o NIDDM with chronic kidney disease and painful mycotic nails b/l that are difficult to trim. Pain interferes with ambulation. Aggravating factors include wearing enclosed shoe gear. Pain is relieved with periodic professional debridement.   She states blood glucose was 116 mg/dl this morning.   Allergies  Allergen Reactions  . Penicillins Rash    Has patient had a PCN reaction causing immediate rash, facial/tongue/throat swelling, SOB or lightheadedness with hypotension: Yes Has patient had a PCN reaction causing severe rash involving mucus membranes or skin necrosis: No Has patient had a PCN reaction that required hospitalization No Has patient had a PCN reaction occurring within the last 10 years: Yes If all of the above answers are "NO", then may proceed with Cephalosporin use.      Objective: There were no vitals filed for this visit.  Pt is a pleasant 79 y.o. year old [place] female, morbidly obese, in NAD. AAO x 3.   Vascular Examination:  Capillary refill time to digits immediate b/l. Palpable DP pulses b/l. Nonpalpable PT pulses b/l. Pedal hair absent b/l Skin temperature gradient within normal limits b/l.  Dermatological Examination: Pedal skin with normal turgor, texture and tone bilaterally. No open wounds bilaterally. No interdigital macerations bilaterally. Toenails 1-5 b/l elongated, dystrophic, thickened, crumbly with subungual debris and tenderness to dorsal palpation.  Musculoskeletal: Normal muscle strength 5/5 to all lower extremity muscle groups bilaterally. No pain crepitus or joint limitation noted with ROM b/l. Pes planus deformity noted b/l.  Utilizes walker for ambulation assistance.  Neurological: Protective sensation diminished with 10g monofilament b/l. Vibratory sensation intact b/l. Proprioception intact bilaterally. Babinski reflex negative b/l. Clonus negative  b/l.  Assessment: 1. Pain due to onychomycosis of toenails of both feet   2. Diabetic peripheral neuropathy associated with type 2 diabetes mellitus ([place])    Plan: -Examined patient. -No new findings. No new orders. -Continue diabetic foot care principles. -Toenails 1-5 b/l were debrided in length and girth with sterile nail nippers and dremel without iatrogenic bleeding.  -Patient to report any pedal injuries to medical professional immediately. -Patient/POA to call should there be question/concern in the interim.  Return in about 3 months (around 07/28/2020) for diabetic foot care.  [name], DPM

## 2020-05-04 ENCOUNTER — Other Ambulatory Visit: Payer: Self-pay | Admitting: *Deleted

## 2020-05-04 NOTE — Patient Outreach (Signed)
[place] [facility]) Care Management  05/04/2020  Tyashia Morrisette [birth year] [id number]  [facility] incoming outreach from complex care patient Mrs Alexis Farmer was referred to [facility] on 11/17/19  Referral source [employer] and [facility] leadership for uncontrolled Diabetes A1C of 9.9, SE for possible housing and food resources.  Insurance Medicare, [employer]  Admissions/ED visits None in the last 6 months   Mrs Daponte called [facility] RN CM tearful She reports she needs a way to get to her doctors and don't want to loose them. Her daughter who usually assists her is having increase pain episodes and is not as available.    Patient is able to verify HIPAA ([place] and Accountability Act) identifiers Reviewed and addressed the purpose of the follow up call with the patient She confirms her coverage remains [place] and Medicare Consent: [facility] ([place]) RN CM reviewed [facility] services with patient. Patient gave verbal consent for services.  Community resource needs and symptoms of depression Mrs Vignola reports "I am just tired", "I am not use to being alone" She reports being alone for "a year and a half now" Mrs Gest was allowed to ventilate her feelings. Empathy, sympathy and support provided.  Discussed that there are possible community resources to assist like doctors that visit in the home, telephone medical visits, virtual medical visits and medical transportation for patients over 30 years of age Mrs Rosello reports she would prefer to go out of her apartment to visit MDs vs MD home, telephonic or virtual visits as she feels her MDs need to see her for evaluation and possible further labs, imaging and tests after the evaluations. Discussed with her that a home visiting MD and other staff are available to do the same as services have been established since covid precautions.  She is agreeing to get assist from [facility] SW for transportation resources to get to see her  medical providers in [place] She lives in [place]   Depression screen [facility] 2/9 05/04/2020  Decreased Interest 1  Down, Depressed, Hopeless 2  PHQ - 2 Score 3  Altered sleeping 1  Tired, decreased energy 1  Change in appetite 1  Feeling bad or failure about yourself  1  Trouble concentrating 0  Moving slowly or fidgety/restless 0  Suicidal thoughts 0  PHQ-9 Score 7  Difficult doing work/chores Somewhat difficult  Some recent data might be hidden    Mrs Stauder confirms she is not on medications for depression but admits she has symptoms of depression. Prefers to "give it to God" and coping technique includes her spiritual practices  [facility] RN CM discussed possible socialization resource for her statements of being "bored" and "I am not use to being alone"   She denied she would feel comfortable related to covid precautions and her interactions with socialization resources. She reports she does have family and friends to call her but she "don't have much to say to them"    She confirms she has [facility] SW number already The number was verified with her She was encouraged to outreach to [place] was sent a message    Plans [facility] RN CM will follow up with Mrs Deans within the next 60-75 business days as scheduled. Discussed her schedule appointment. Not preferring to change it She encouraged to return a call to [place] CM prn Routed note to MD Goals      Patient Stated   .  [facility]) Find Help in My Community (pt-stated)  Follow Up Date 07/01/20   - follow-up on any referrals for help I am given - have a back-up plan    Notes:     .  [facility]) Manage My Emotions (pt-stated)      Follow Up Date 07/01/20   - call and visit an old friend - talk about feelings with a friend, family or spiritual advisor   Notes:     .  [facility]) Manage Pain (pt-stated)      Follow Up Date 07/01/2020   - develop a personal pain management plan - prioritize tasks for the day - use ice or  heat for pain relief - work slower and less intense when having pain    Notes: 03/31/20 personal pain plan - use of epsom salt water  Has not follow up with chiropractor, MD    .  [facility]) Monitor and Manage My Blood Sugar (pt-stated)      Follow Up Date 07/01/2020   - check blood sugar at prescribed times - check blood sugar if I feel it is too high or too low - take the blood sugar meter to all doctor visits    Notes: 03/31/20 continues to monitor cbgs, reports wt loss of 6 lbs & reduction of hgA1c by 0.4 (10.4 to 10)       [place]. Lavina Hamman, RN, BSN, [place] Coordinator Office number [phone] Main [facility] number [phone] Fax number [phone]

## 2020-05-04 NOTE — Addendum Note (Signed)
Addended by: Barbaraann Faster on: 05/04/2020 02:10 PM   Modules accepted: Orders

## 2020-05-04 NOTE — Patient Outreach (Signed)
[facility] [facility]) Care Management  05/04/2020  Lea Baine [birth year] [id number]   CSW was asked to contact pt to return her outreach call for [employer], LCSW, assigned CSW.    CSW confirmed pt's identity and introduced self, role and reason for call. Pt reports she is in need of rides to several upcoming appointments.  Her first need is 05/16/2020 to get to and from her Eye Doctor. Pt did not have the specifics and will plan to collect all the dates and locations/addresses for these appointments.  CSW advised pt to expect a follow up call from her CSW later this week.  Pt with apparent anxiety related to this. CSW assured her we can assist with transportation needs as well as other things.  Pt appreciative of above assistance.  CSW will brief assigned CSW for her follow up.   Eduard Clos, MSW, [place] Worker  [place] [phone] .

## 2020-05-04 NOTE — Patient Outreach (Signed)
[place] [facility]) Care Management  05/04/2020  Corrin Sieling Oct 27, 1940 [id number]   [place] coordination- Transportation needs  collaboration with [facility] SWs Updated on patient needs [facility] referral ordered   Plans [facility] RN CM will follow up with Mrs Overall in 90-97 business days as agreed or prn if she needs [facility] RN CM Pt encouraged to return a call to [facility] RN CM prn Routed note to MD Goals      Patient Stated   .  [facility]) Find Help in My Community (pt-stated)      Follow Up Date 07/01/20   - follow-up on any referrals for help I am given - have a back-up plan     Notes: 05/04/20 re referred to [facility] SW for transportation resources and [facility] 2/9 scores    .  [facility]) Manage My Emotions (pt-stated)      Follow Up Date 07/01/20   - call and visit an old friend - talk about feelings with a friend, family or spiritual advisor      Notes:   05/04/20 re referred to [facility] SW for transportation resources and [facility] 2/9 scores    .  [facility]) Manage Pain (pt-stated)      Follow Up Date 07/01/2020   - develop a personal pain management plan - prioritize tasks for the day - use ice or heat for pain relief - work slower and less intense when having pain    Notes: 03/31/20 personal pain plan - use of epsom salt water  Has not follow up with chiropractor, MD    .  [facility]) Monitor and Manage My Blood Sugar (pt-stated)      Follow Up Date 07/01/2020   - check blood sugar at prescribed times - check blood sugar if I feel it is too high or too low - take the blood sugar meter to all doctor visits      Notes: 03/31/20 continues to monitor cbgs, reports wt loss of 6 lbs & reduction of hgA1c by 0.4 (10.4 to 10)        [place]. Lavina Hamman, RN, BSN, [place] Coordinator Office number [phone] Mobile number [phone]  Main [facility] number [phone] Fax number [phone]

## 2020-05-05 ENCOUNTER — Encounter: Payer: Self-pay | Admitting: *Deleted

## 2020-05-05 ENCOUNTER — Other Ambulatory Visit: Payer: Self-pay | Admitting: *Deleted

## 2020-05-05 NOTE — Patient Outreach (Signed)
[place] [facility] Care Management  05/05/2020  Alexis Farmer 1941-04-30 [id number]  CSW was able to make contact with patient today to perform phone assessment, as well as assess and assist with social work needs and services.  Patient remembers having worked with [place] in the past, agreeing to have [place] provide social work services for this new referral.  CSW explained to patient that CSW works with patient's RNCM, Jackelyn Poling also with [facility].  CSW then explained the reason for the call, indicating that Alexis Farmer thought that patient would benefit from social work services and resources to assist with arranging transportation to and from physician appointments, as well as a referral to Meals on Wheels, through [employer].  Alexis Farmer also requested that CSW inquire about patient's willingness to receive counseling and supportive services for symptoms of Anxiety and Depression.  CSW obtained two HIPAA compliant identifiers from patient, which included patient's name and date of birth.  Patient admitted that she is definitely in need of transportation services, "very anxious and uptight about getting to and from doctors appointments because there are currently no other means of transportation available to her.  CSW agreed to assist patient with arranging transportation services to her upcoming physician appointments, as well as assist patient with completion of a SCAT Paramedic) application, through the Technical sales engineer.  Patient verbalized that she is willing to utilize public transportation, as long as the transport is from door-to-door and she does not have to wait at a bus stop.  CSW will also mail patient a list of transportation resources, for future reference.  Last, CSW obtained verbal consent from patient to place a referral for transportation services, as well as for prepared meal services, through the  [place].    CSW was able to arrange transportation for patient to three of her upcoming physician appointments, while awaiting for patient to be approved for SCAT, which include all of the following appointments:  Thursday, May 19, 2020 at 2:00 PM, with Dr. Benjaman Kindler, Obstetrics and Gynecology, at [facility], Thursday, June 23, 2020 at 11:00 AM, with Dr. Mee Hives, Endocrinology, at [facility], and Wednesday, June 29, 2020 at 10:40 AM, with Dr. Murlean Iba, Nephrology, at [facility].  Transportation was arranged through [employer].  Patient has been provided the contact number and encouraged to contact [facility] directly, if she needs to cancel or change her appointment time and date.  Through the [employer], CSW was also able to submit an application for patient to receive [employer], through [employer] of [place].  Patient is aware that there is an extensive waiting list, and that a representative from [employer] [facility] will be contacting her to perform a telephone assessment to determine eligibility.  Patient denied the need for counseling and supportive services at this time, admitting that her biggest source of Depression and Anxiety stemmed from not having transportation to her appointments, which has now been resolved.  Patient did not wish for CSW to mail her a list of community agencies and resources that offer counseling services, nor was she interested in having CSW provide her with [facility] Educational material, pertaining specifically to Depression and Anxiety.  Patient was agreeable to having CSW contact her again in a few weeks to follow-up, indicating that Wednesday, May 25, 2020, around 10:00 AM  would be convenient for her.  [name], BSW, MSW, LCSW  Licensed  Education officer, environmental Health System  Mailing [place] [address], [place], [place] [address] Physical Address-[address], [place], [place] [address] Toll Free Main # [phone] Fax # [phone] Cell # [phone]  [name]

## 2020-05-06 ENCOUNTER — Encounter (HOSPITAL_COMMUNITY): Payer: Self-pay | Admitting: Emergency Medicine

## 2020-05-06 ENCOUNTER — Encounter: Payer: Self-pay | Admitting: *Deleted

## 2020-05-06 ENCOUNTER — Ambulatory Visit (HOSPITAL_COMMUNITY)
Admission: EM | Admit: 2020-05-06 | Discharge: 2020-05-06 | Disposition: A | Discharge status: Home / Self Care | Payer: Medicare Other | Attending: Family Medicine | Admitting: Family Medicine

## 2020-05-06 ENCOUNTER — Other Ambulatory Visit: Payer: Self-pay

## 2020-05-06 DIAGNOSIS — H6121 Impacted cerumen, right ear: Secondary | ICD-10-CM

## 2020-05-06 DIAGNOSIS — H6591 Unspecified nonsuppurative otitis media, right ear: Secondary | ICD-10-CM

## 2020-05-06 MED ORDER — FLUTICASONE PROPIONATE 50 MCG/ACT NA SUSP: 2.0000 | Freq: Every day | NASAL | 2 refills | Status: DC

## 2020-05-06 NOTE — ED Triage Notes (Signed)
Patient c/o abnormal noise in RT ear and pain ("soreness") x 1 week.   Patient stated this has never happened before.   Patient has tried Tylenol for pain w/ no relief of symptoms.   Patient denies fever at home.

## 2020-05-06 NOTE — Discharge Instructions (Signed)
We clean the ear out today. Use the Flonase nasal spray daily. Follow up as needed for continued or worsening symptoms

## 2020-05-09 NOTE — ED Provider Notes (Signed)
[place]    CSN: [id number] Arrival date & time: 05/06/20  2542      History   Chief Complaint Chief Complaint  Patient presents with  . Otalgia    HPI Alexis Farmer is a 79 y.o. female.   Patient is an 79 year old female presents today with ear pain.  Reporting for the past week or so she has had sensation of fluid in ear, muffled hearing and abnormal noise in her ear.  Mild discomfort.  Denies any foreign bodies in the ear.  Denies any injuries or drainage from the ear.  She is been taken Tylenol for the pain with no relief.  No fever, nasal congestion, rhinorrhea, cough     Past Medical History:  Diagnosis Date  . Arthritis   . Chickenpox   . Diabetes mellitus without complication ([place])    one elevated reading/ no treatment  . Diverticulitis   . GI bleed   . High cholesterol   . History of blood transfusion   . Hypertension   . Renal insufficiency     Patient Active Problem List   Diagnosis Date Noted  . Hypertension associated with diabetes ([place]) 03/25/2020  . Multinodular goiter 02/04/2020  . Peripheral edema 12/22/2019  . Proteinuria 12/22/2019  . Constipation 11/17/2019  . Recurrent falls 08/13/2019  . Physical deconditioning 08/13/2019  . Abdominal aortic atherosclerosis ([place]) 07/24/2019  . Morbid obesity with BMI of 45.0-49.9, adult ([place]) 06/09/2019  . Hypercalcemia 03/18/2019  . Round hole, unspecified eye 02/06/2019  . 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 Disorder of rotator cuff 06/24/2018  . Abnormal gait 03/26/2018  . Fall 03/26/2018  . Arthritis 03/26/2018  . Thyroid nodule 01/20/2018  . Dysuria 03/22/2017  . Cervical spondylosis with radiculopathy 01/26/2017  . Rectal hemorrhage 07/29/2016  . Carotid artery stenosis 07/19/2016  . PAD (peripheral artery disease) ([place]) 07/19/2016  . Swelling of lower leg 07/19/2016  . Bradycardia 07/17/2016  . Postmenopausal bleeding 07/17/2016  . Chronic  diastolic heart failure ([place]) 05/31/2016  . Cough 01/25/2016  . Chronic fatigue 11/15/2015  . Depressive disorder 10/13/2015  . Osteoarthritis 10/13/2015  . Vertigo 10/13/2015  . Anxiety 09/13/2015  . Gastrointestinal hemorrhage 08/03/2015  . Mixed hyperlipidemia 08/26/2014  . Essential hypertension 04/08/2014  . Diabetes mellitus type II, uncontrolled ([place]) 04/08/2014  . Chronic kidney disease, stage 3 unspecified ([place]) 04/08/2014  . Edema of foot 04/08/2014  . Morbid obesity ([place]) 04/08/2014  . Low back pain 04/08/2014  . Diabetic polyneuropathy ([place]) 04/08/2014  . Type 2 diabetes mellitus with other diabetic kidney complication ([place]) 70/62/3762    Past Surgical History:  Procedure Laterality Date  . APPENDECTOMY    . CHOLECYSTECTOMY    . ECTOPIC PREGNANCY SURGERY    . EYE SURGERY     bilateral cataracts  . EYE SURGERY     02/11/2019 repair hole in right eye   . gallbladder     . HYSTEROSCOPY WITH D & C N/A 10/26/2016   Procedure: DILATATION AND CURETTAGE /HYSTEROSCOPY;  Surgeon: Benjaman Kindler, MD;  Location: [facility] ORS;  Service: Gynecology;  Laterality: N/A;  . HYSTEROSCOPY WITH D & C N/A 07/07/2018   Procedure: DILATATION AND CURETTAGE /HYSTEROSCOPY;  Surgeon: Benjaman Kindler, MD;  Location: [facility] ORS;  Service: Gynecology;  Laterality: N/A;  . THYROIDECTOMY, PARTIAL      OB History   No obstetric history on file.      Home Medications    Prior to Admission medications  Medication Sig Start Date End Date Taking? Authorizing Provider  acetaminophen (TYLENOL) 500 MG tablet Take 500 mg by mouth every 6 (six) hours as needed.    Yes [provider]  amLODipine (NORVASC) 10 MG tablet Take 1 tablet (10 mg total) by mouth daily. 03/24/20  Yes McLean-Scocuzza, Nino Glow, MD  Ascorbic Acid (VITAMIN C PO) Take 500 mg by mouth daily.   Yes [provider]  blood glucose meter kit and supplies KIT Accu chek, Dx code E11.65, check 3 times daily 07/26/17  Yes  Leone Haven, MD  cloNIDine (CATAPRES) 0.2 MG tablet Take 1 tablet (0.2 mg total) by mouth 3 (three) times daily. 08/13/19  Yes McLean-Scocuzza, Nino Glow, MD  ezetimibe (ZETIA) 10 MG tablet Take 1 tablet (10 mg total) by mouth daily. 09/24/19  Yes McLean-Scocuzza, Nino Glow, MD  glucose 4 GM chewable tablet Chew 1 tablet (4 g total) by mouth as needed for low blood sugar. <70 11/10/19  Yes McLean-Scocuzza, Nino Glow, MD  glucose blood test strip Use as instructed tid  Accuchek Guide 03/18/19  Yes McLean-Scocuzza, Nino Glow, MD  insulin NPH Human (NOVOLIN N) 100 UNIT/ML injection INJECT 20 UNITS IN THE MORNING AND 20 UNITS IN THE EVENING WITH A MEAL (pt actually taking 20 qam and 15 units qpm) 01/27/20  Yes McLean-Scocuzza, Nino Glow, MD  insulin regular (NOVOLIN R) 100 units/mL injection 15 minutes before meals tid. 131-180 2 units 181 to 240 4 units 241-300 6units 301-350 8 units 351-400 10 units>400 12 units.  Keep glucose tablets for sugar < 70 01/22/20  Yes McLean-Scocuzza, Nino Glow, MD  Insulin Syringe-Needle U-100 (B-D INS SYR ULTRAFINE .5CC/30G) 30G X 1/2" 0.5 ML MISC 1 Device by Does not apply route daily. Up to 5x per day with insulin bid (NPH) and tid (Regular) 08/13/19  Yes McLean-Scocuzza, Nino Glow, MD  meclizine (ANTIVERT) 12.5 MG tablet Take 1 tablet (12.5 mg total) by mouth daily as needed for dizziness. 09/02/19  Yes McLean-Scocuzza, Nino Glow, MD  Multiple Vitamin (MULTIVITAMIN WITH MINERALS) TABS tablet Take 1 tablet by mouth daily.   Yes [provider]  progesterone (PROMETRIUM) 200 MG capsule Take by mouth. 08/11/19  Yes [provider]  rosuvastatin (CRESTOR) 10 MG tablet TAKE 1 TABLET (10 MG TOTAL) BY MOUTH DAILY. 08/13/19  Yes McLean-Scocuzza, Nino Glow, MD  spironolactone (ALDACTONE) 25 MG tablet Take 1 tablet (25 mg total) by mouth daily. 01/22/20  Yes McLean-Scocuzza, Nino Glow, MD  torsemide (DEMADEX) 10 MG tablet Take 10 mg by mouth daily as needed. 06/22/19  Yes [provider]  diclofenac Sodium (VOLTAREN) 1 % GEL Apply 2 g topically 4 (four) times daily. Upper body and 4 gram qid prn lower body arthritis pain/musculoskeletal pain this is also OTC 02/22/20   McLean-Scocuzza, Nino Glow, MD  fluticasone (FLONASE) 50 MCG/ACT nasal spray Place 2 sprays into both nostrils daily for 7 days. 05/06/20 05/13/20  Loura Halt A, NP  mupirocin ointment (BACTROBAN) 2 % Apply 1 application topically 2 (two) times daily. Right index finger and left forearm 03/23/20   McLean-Scocuzza, Nino Glow, MD  polyethylene glycol powder (GLYCOLAX/MIRALAX) 17 GM/SCOOP powder Take 17 g by mouth daily as needed for moderate constipation or severe constipation. Can take up to 2x per day prn. Mix with 8 ounces of liquid 11/10/19   McLean-Scocuzza, Nino Glow, MD    Family History Family History  Problem Relation Age of Onset  . Diabetes Mother   . Hypertension Mother   .  Stroke Mother   . Diabetes Other   . Healthy Father   . Diabetes Sister   . Heart disease Sister     Social History Social History   Tobacco Use  . Smoking status: Never Smoker  . Smokeless tobacco: Never Used  Vaping Use  . Vaping Use: Never used  Substance Use Topics  . Alcohol use: No  . Drug use: No     Allergies   Penicillins   Review of Systems Review of Systems   Physical Exam Triage Vital Signs ED Triage Vitals  Enc Vitals Group     BP 05/06/20 0901 130/70     Pulse Rate 05/06/20 0901 85     Resp 05/06/20 0901 12     Temp 05/06/20 0901 98.2 F (36.8 C)     Temp Source 05/06/20 0901 Oral     SpO2 05/06/20 0901 98 %     Weight --      Height --      Head Circumference --      Peak Flow --      Pain Score 05/06/20 0858 7     Pain Loc --      Pain Edu? --      Excl. in [place]? --    No data found.  Updated Vital Signs BP 130/70 (BP Location: Right Arm)   Pulse 85   Temp 98.2 F (36.8 C) (Oral)   Resp 12   SpO2 98%   Visual Acuity Right Eye Distance:   Left Eye Distance:     Bilateral Distance:    Right Eye Near:   Left Eye Near:    Bilateral Near:     Physical Exam Vitals and nursing note reviewed.  Constitutional:      General: She is not in acute distress.    Appearance: Normal appearance. She is not ill-appearing, toxic-appearing or diaphoretic.  HENT:     Head: Normocephalic.     Right Ear: There is impacted cerumen.     Left Ear: Tympanic membrane and ear canal normal.     Nose: Nose normal.  Eyes:     Conjunctiva/sclera: Conjunctivae normal.  Pulmonary:     Effort: Pulmonary effort is normal.  Musculoskeletal:        General: Normal range of motion.     Cervical back: Normal range of motion.  Skin:    General: Skin is warm and dry.     Findings: No rash.  Neurological:     Mental Status: She is alert.  Psychiatric:        Mood and Affect: Mood normal.      UC Treatments / Results  Labs (all labs ordered are listed, but only abnormal results are displayed) Labs Reviewed - No data to display  EKG   Radiology No results found.  Procedures Procedures (including critical care time)  Medications Ordered in UC Medications - No data to display  Initial Impression / Assessment and Plan / UC Course  I have reviewed the triage vital signs and the nursing notes.  Pertinent labs & imaging results that were available during my care of the patient were reviewed by me and considered in my medical decision making (see chart for details).     Impacted cerumen.  Patient is ear cleaned out with antibiotics today.  Patient tolerated well. Effusion noted on reassessment of the ear after cerumen removal Most likely patient is symptoms related to eustachian tube dysfunction. Prescribed Flonase daily. Follow up  as needed for continued or worsening symptoms  Final Clinical Impressions(s) / UC Diagnoses   Final diagnoses:  Impacted cerumen of right ear  Middle ear effusion, right     Discharge Instructions     We clean the ear out  today. Use the Flonase nasal spray daily. Follow up as needed for continued or worsening symptoms     ED Prescriptions    Medication Sig Dispense Auth. Provider   fluticasone (FLONASE) 50 MCG/ACT nasal spray Place 2 sprays into both nostrils daily for 7 days. 16 g Loura Halt A, NP     PDMP not reviewed this encounter.   Orvan July, NP 05/09/20 [phone]

## 2020-05-15 IMAGING — CT CT ABDOMEN AND PELVIS WITHOUT CONTRAST
2 of 4 series · 16 of 46 positions shown, 18 images · non-contrast
Comparison: 07/16/2016

CLINICAL DATA: Generalized abdominal pain and hematuria.

EXAM:
CT ABDOMEN AND PELVIS WITHOUT CONTRAST
TECHNIQUE: Multidetector CT imaging of the abdomen and pelvis was performed
following the standard protocol without IV contrast.

[Series 2: axial st · axial · 0.98mm/px · z∈[-502,-102]mm · 13 of 88 slices shown, 15 images]
[im 4/88  soft-tissue]
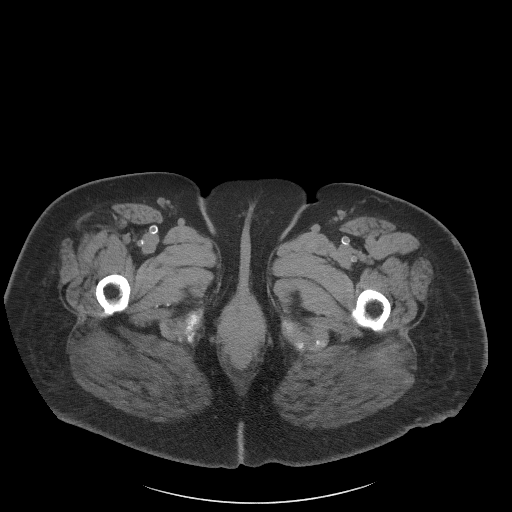
[im 4/88  bone]
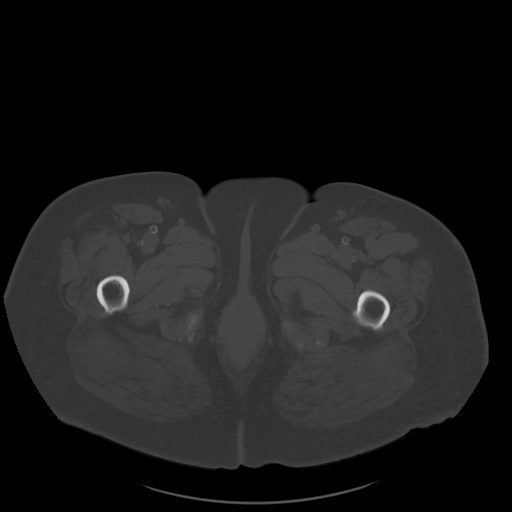
[im 11/88  soft-tissue]
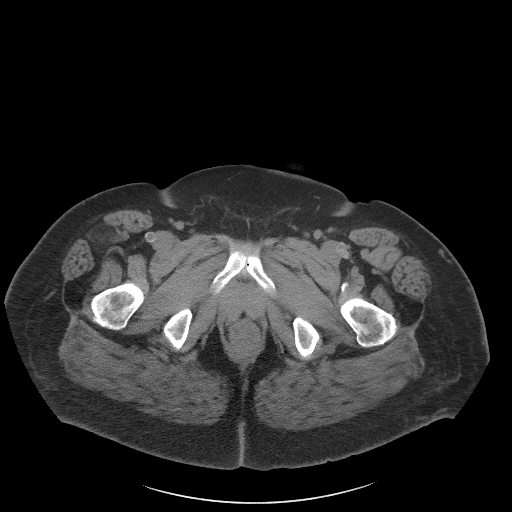
[im 19/88  soft-tissue]
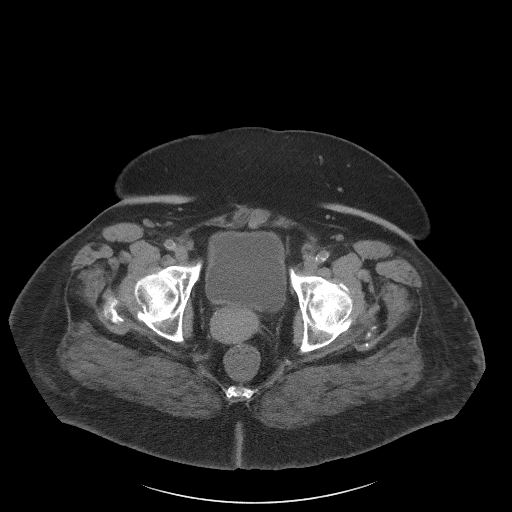
[im 26/88  soft-tissue]
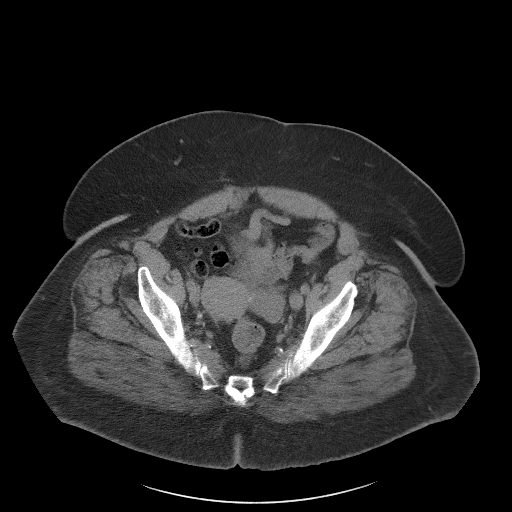
[im 30/88  soft-tissue]
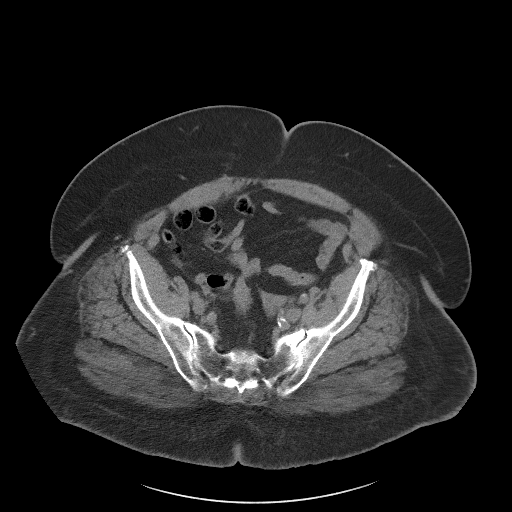
[im 37/88  soft-tissue]
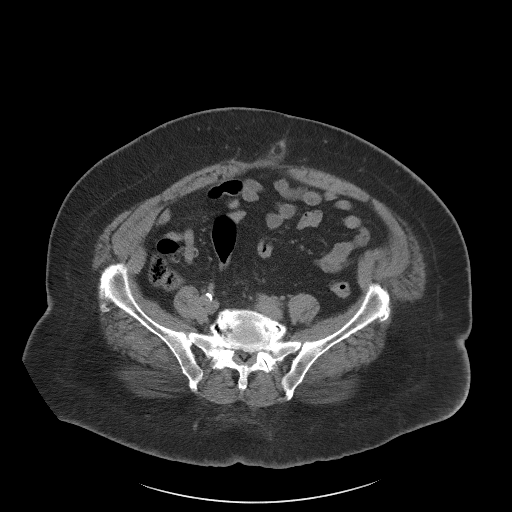
[im 44/88  soft-tissue]
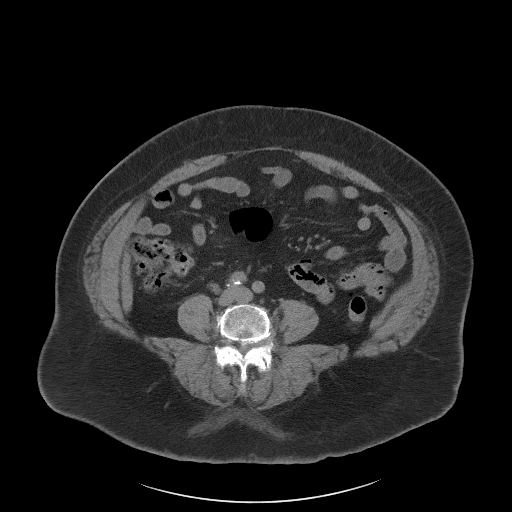
[im 51/88  soft-tissue]
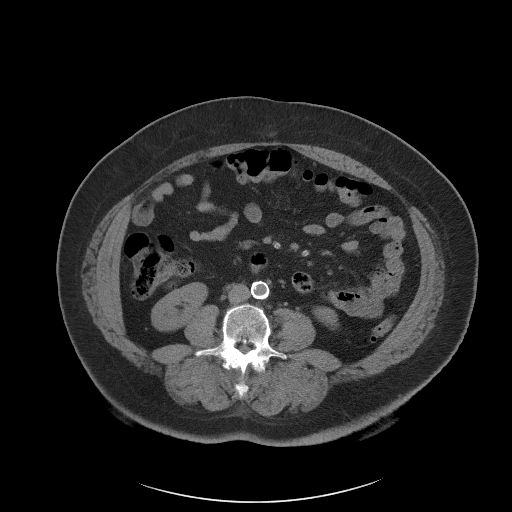
[im 59/88  soft-tissue]
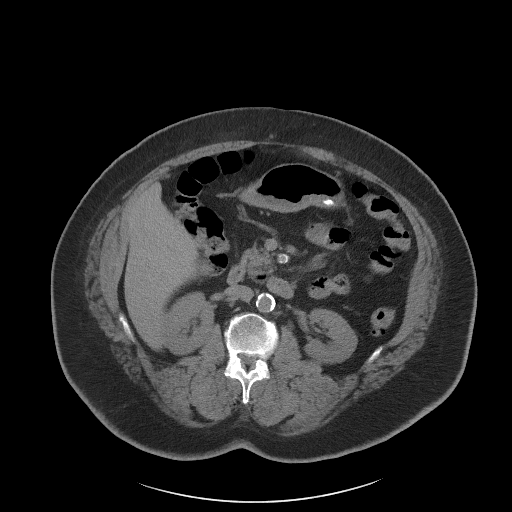
[im 59/88  bone]
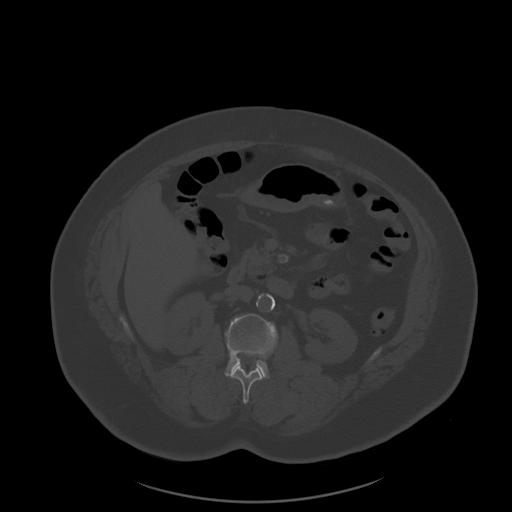
[im 62/88  soft-tissue]
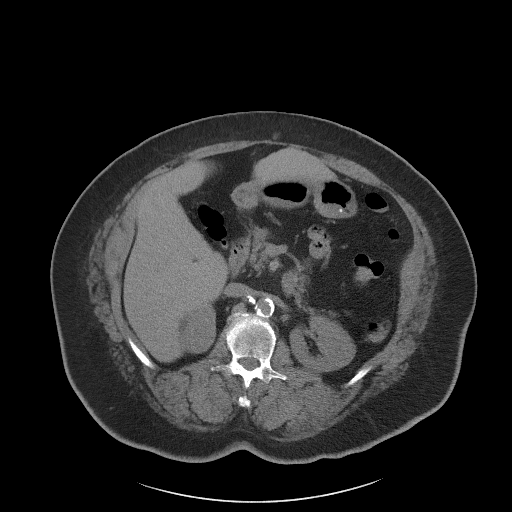
[im 69/88  soft-tissue]
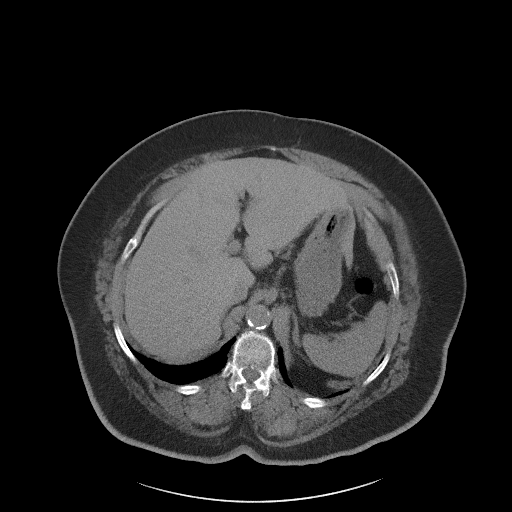
[im 77/88  soft-tissue]
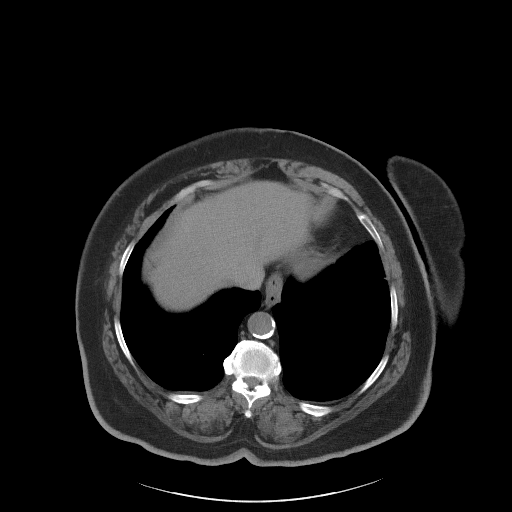
[im 84/88  soft-tissue]
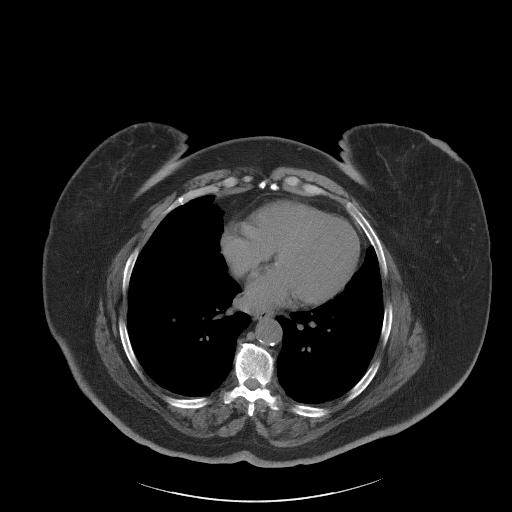

[Series 5: coronal st · coronal · 0.90mm/px · 3 of 115 slices shown]
[im 39/115  soft-tissue]
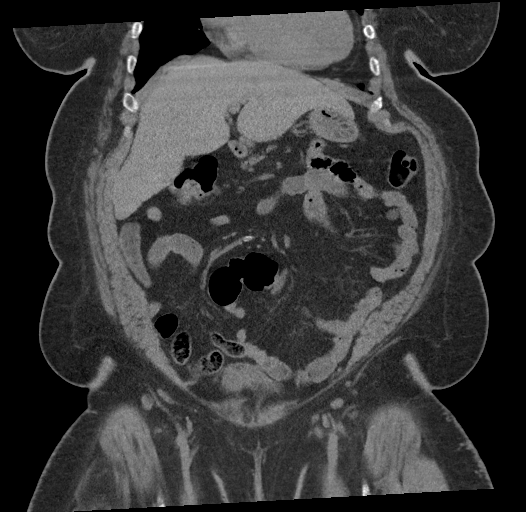
[im 51/115  soft-tissue]
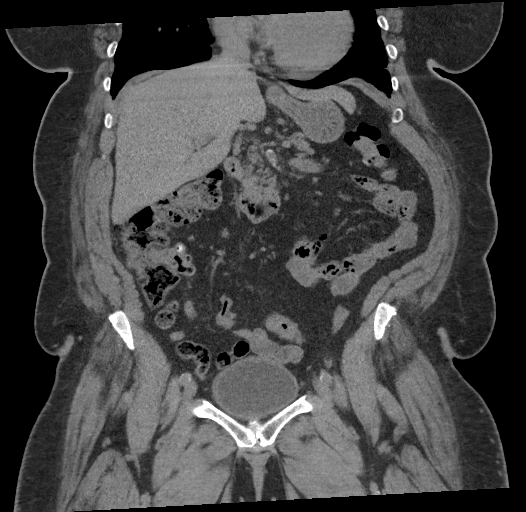
[im 64/115  soft-tissue]
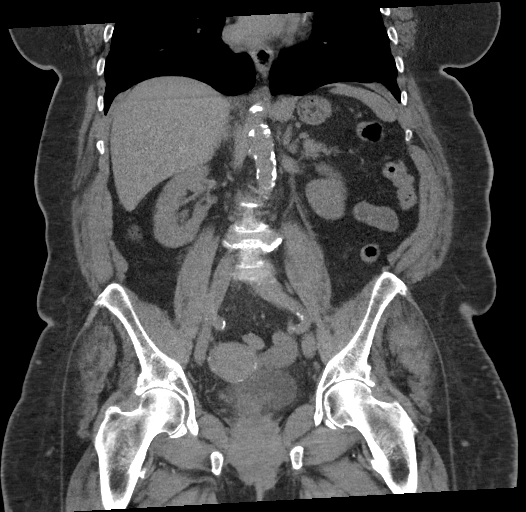

[16 of 46 positions shown; findings below may reference images not displayed]

FINDINGS: Lower chest: Unremarkable

Hepatobiliary: No focal abnormality in the liver on this study
without intravenous contrast. Gallbladder is surgically absent. No
intrahepatic or extrahepatic biliary dilation.

Pancreas: No focal mass lesion. No dilatation of the main duct. No
intraparenchymal cyst. No peripancreatic edema.

Spleen: No splenomegaly. No focal mass lesion.

Adrenals/Urinary Tract: No adrenal nodule or mass. Kidneys
unremarkable. Specifically, no renal stones or secondary changes in
either kidney. No evidence for ureteral stones. No hydroureter.
Bladder unremarkable.

Stomach/Bowel: Stomach is unremarkable. No gastric wall thickening.
No evidence of outlet obstruction. Duodenum is normally positioned
as is the ligament of Treitz. Duodenal diverticulum evident. No
small bowel wall thickening. No small bowel dilatation. The terminal
ileum is normal. The appendix is not visualized, but there is no
edema or inflammation in the region of the cecum. No gross colonic
mass. No colonic wall thickening. Diverticuli are seen scattered
along the entire length of the colon without CT findings of
diverticulitis.

Vascular/Lymphatic: There is abdominal aortic atherosclerosis
without aneurysm. There is no gastrohepatic or hepatoduodenal
ligament lymphadenopathy. No intraperitoneal or retroperitoneal
lymphadenopathy. No pelvic sidewall lymphadenopathy.

Reproductive: The uterus is unremarkable. Stable left adnexal region
without a definite adnexal mass.

Other: No intraperitoneal free fluid.

Musculoskeletal: No worrisome lytic or sclerotic osseous
abnormality.
IMPRESSION: 1. No acute findings in the abdomen or pelvis. Specifically, no
findings to explain the patient's history of abdominal pain and
hematuria.
2. Colonic diverticulosis without diverticulitis.
3. Abdominal aortic atherosclerosis.

Aortic Atherosclerosis ([id number]).

## 2020-05-16 ENCOUNTER — Telehealth: Payer: Self-pay | Admitting: Internal Medicine

## 2020-05-16 ENCOUNTER — Other Ambulatory Visit: Payer: Self-pay | Admitting: *Deleted

## 2020-05-16 ENCOUNTER — Telehealth: Payer: Self-pay | Admitting: Cardiovascular Disease

## 2020-05-16 LAB — HM DIABETES EYE EXAM

## 2020-05-16 NOTE — Telephone Encounter (Signed)
Patient c/o Palpitations:  High priority if patient c/o lightheadedness, shortness of breath, or chest pain  1) How long have you had palpitations/irregular HR/ Afib? Are you having the symptoms now?  Yes, feels like when she is walking heart begins beating hard and fast   2) Are you currently experiencing lightheadedness, SOB or CP? Noise in ear is loud - heart is beating hard   3) Do you have a history of afib (atrial fibrillation) or irregular heart rhythm? no  4) Have you checked your BP or HR? (document readings if available): no, not able   5) Are you experiencing any other symptoms? Denies dizziness, chest pain, SOB

## 2020-05-16 NOTE — Patient Outreach (Signed)
[facility]) Care Management  05/16/2020  Alexis Farmer [birth year] [id number]   CSW received an incoming call from patient this morning at 7:31AM, wanting to confirm her transportation arrangements through [name], for all of her upcoming physician appointments.  CSW spent a great deal of time explaining to patient that CSW has arranged transportation services to all of the following appointments, also listed below on the [place] Patient Intake Request Forms:  Monday, May 16, 2020 at 11:00AM Monday, May 23, 2020 at 10:00AM Thursday, June 23, 2020 at 10:30AM Tuesday, July 05, 2020 at 10:20AM Wednesday, July 13, 2020 at 9:45AM  [name]   Patients Name Alexis Farmer Patients Phone Number [phone]  Patient DOB Aug 14, 1940 Patient MRN   [id number]   Patient Home Address  [place] [address] Patient Insurance  Medicare [id number]  Pick Up Ride Information   Date of Request  Thursday, May 05, 2020 Pick Up Date Monday, May 16, 2020  Pick up Location [address]  [place] [address] Pick Up Time  10:15AM  Does pt. use cane, walker, transfer wheelchair?  Walker Is patient traveling alone?                   Yes  Dropoff Location  [address] [place], [place] [address] Appointment Time 11:00AM  Should driver wait for pt. for return trip?   *This should only be used for patients with appts. Over 1 hour* Yes, please remain with the patient for the duration of the appointment.  Return [place] up Location  [address] [place], [place] [address] Pick up Time Directly after appointment.  Does pt. use cane, walker, transfer wheelchair?  Walker Is patient traveling alone? Yes  Dropoff/Return Location  [address]  [place] [address]  Intake Logistics   Department  [facility]  [phone]  Ride Scheduled by: (TC Name) [name], LCSW Pt. Referred By [name], [place] Patient Intake Request   Patients Name Alexis Farmer Patients Phone Number [phone]  Patient DOB Nov 01, 1940 Patient MRN   [id number]   Patient Home Address  [place] [address] Patient Insurance  Medicare [id number]  [place]   Date of Request  Thursday, May 05, 2020 Pick Up Date Monday, May 23, 2020  Pick up Location [address]  [place] [address] Pick Up Time  9:15AM  Does pt. use cane, walker, transfer wheelchair?  Walker Is patient traveling alone?                   Yes  Dropoff Location  [address], [place], [place], [place] Appointment Time 10:00AM  Should driver wait for pt. for return trip?   *This should only be used for patients with appts. Over 1 hour* Yes, please remain with the patient for the duration of the appointment.  Return [place] up Location [address] [place], [place], [place]  Pick up Time Directly after appointment.  Does pt. use cane, walker, transfer wheelchair?  Walker Is patient traveling alone? Yes  Dropoff/Return Location  [address]  [place] [address]  Intake Logistics   Department  [facility]  [facility]  [phone]  Ride Scheduled by: (TC Name) [name], LCSW Pt. Referred By [name], [place] [facility] Patient Intake Request   Patients Name Alexis Farmer Patients Phone Number [phone]  Patient DOB Sep 14, 1940 Patient MRN   [id number]   Patient Home Address  [place] [address] Patient Insurance  Medicare [id number]  [place]   Date of Request  Thursday, May 05, 2020 Pick Up Date Thursday, June 23, 2020  Pick up  Location [address]  [place] [address] Pick Up Time  9:45AM  Does pt. use cane, walker, transfer wheelchair?  Walker Is patient traveling alone?                   Yes  Dropoff Location [address], [place], [place]  Appointment Time 10:30AM  Should driver wait for pt. for return trip?   *This should only be used for patients with appts. Over 1 hour* Yes, please remain with the patient for the duration of the appointment.  Return [place] up Location [address], [place], [place]  Pick up Time Directly after appointment.  Does pt. use cane, walker, transfer wheelchair?  Walker Is patient traveling alone? Yes  Dropoff/Return Location  [address]  [place] [address]  Intake Logistics   Department  [facility]  [phone]  Ride Scheduled by: (TC Name) [name], LCSW Pt. Referred By [name], [place] Patient Intake Request   Patients Name Alexis Farmer Patients Phone Number [phone]  Patient DOB 1940/09/13 Patient MRN   [id number]   Patient Home Address  [place] [address] Patient Insurance  Medicare [id number]  [place]   Date of Request  Thursday, May 05, 2020 Pick Up Date Tuesday, July 05, 2020  Pick up Location [address]   [place] [address] Pick Up Time  9:30AM  Does pt. use cane, walker, transfer wheelchair?  Walker Is patient traveling alone?                   Yes  Dropoff Location [place], [place], [place]  Appointment Time 10:20AM  Should driver wait for pt. for return trip?   *This should only be used for patients with appts. Over 1 hour* Yes, please remain with the patient for the duration of the appointment.  Return [place] up Location  [place], [place], [place] up Time Directly after appointment.  Does pt. use cane, walker, transfer wheelchair?  Walker Is patient traveling alone? Yes  Dropoff/Return Location  [address]   [place] [address]  Intake Logistics   Department  [facility]  [phone]  Ride Scheduled by: (TC Name) [name], LCSW Pt. Referred By [name], [place] [facility] Patient Intake Request   Patients Name Alexis Farmer Patients Phone Number [phone]  Patient DOB Nov 24, 1940 Patient MRN   [id number]   Patient Home Address  [place] [address] Patient Insurance  Medicare [id number]  [place]   Date of Request  Thursday, May 05, 2020 Pick Up  Date Wednesday, July 13, 2020  Pick up Location [address]  [place] [address] Pick Up Time  9:00AM  Does pt. use cane, walker, transfer wheelchair?  Walker Is patient traveling alone?                   Yes  Dropoff Location [address], [place], [place]  Appointment Time 9:45AM  Should driver wait for pt. for return trip?   *This should only be used for patients with appts. Over 1 hour* Yes, please remain with the patient for the duration of the appointment.  Return [place] up Location [address], [place], [place]  Pick up Time Directly after appointment.  Does pt. use cane, walker, transfer wheelchair?  Walker Is patient traveling alone? Yes  Dropoff/Return Location  [address]  [place] [address]  Intake Logistics   Department  [facility]  [phone]  Ride Scheduled by: (TC Name) [name], LCSW Pt. Referred By [name], LCSW    In the meantime, CSW will continue to assist patient with completion of her SCAT Paramedic) Application, but explained to patient that [place] will only transport her  within county limits, as all of the above physician appointments are scheduled in [place] or [place], [place], which is in [place].  Now that patient is residing in [place], [place] [facility]), she has been encouraged to consider changing her physicians from [facility] to [facility], as she will have access to a lot more transportation resources.  Patient is aware that all of the above transportation arrangements are being paid for by [place] [facility].  Patient is agreeable to having CSW follow-up with her again on Wednesday, May 25, 2020, around 10:00AM.  [name], BSW, MSW, [employer]  Licensed Clinical Social Worker  [place]  [employer] [place] [address], [place], [place] [address] Physical Address-[address], [place], [place] [address] Toll Free Main # [phone] Fax # [phone] Cell # [phone]  [name]

## 2020-05-16 NOTE — Telephone Encounter (Signed)
Spoke to patient. States she feels her heartbeat increases and she feels palpitations when she experiences anxiety and is worrying about something. When she gets upset, she feels it happening. She also feels her "heart booming" when she is walking briskly.  Denies chest pain, left arm pain, jaw pain, N/V, sweating. She does have some shortness of breath with exertion. She is not due to follow-up with Dr Rockey Situ until March but would like to be seen sooner. Offered her appointment with Dr Rockey Situ tomorrow but she is not able to come on such short notice due to transportation. She would like to call back to schedule the appointment instead of making it now.  She was concerned about having a heart attack and is glad she is not having the other symptoms of a heart attack at this time.

## 2020-05-16 NOTE — Telephone Encounter (Signed)
   Alexis Farmer DOB: January 18, 1941 MRN: [id number]   RIDER WAIVER AND RELEASE OF LIABILITY  For purposes of improving physical access to our facilities, [facility] is pleased to partner with third parties to provide Alexis Farmer patients or other authorized individuals the option of convenient, on-demand ground transportation services (the [place]") through use of the technology service that enables users to request on-demand ground transportation from independent third-party providers.  By opting to use and accept these [employer], I, the undersigned, hereby agree on behalf of myself, and on behalf of any minor child using the [employer] for whom I am the parent or legal guardian, as follows:  1. Government social research officer provided to me are provided by independent third-party transportation providers who are not [employer] or employees and who are unaffiliated with [employer]. 2. [facility] is neither a transportation carrier nor a common or public carrier. 3. [facility] has no control over the quality or safety of the transportation that occurs as a result of the [employer]. 4. [facility] cannot guarantee that any third-party transportation provider will complete any arranged transportation service. 5. [facility] makes no representation, warranty, or guarantee regarding the reliability, timeliness, quality, safety, suitability, or availability of any of the Transport Services or that they will be error free. 6. I fully understand that traveling by vehicle involves risks and dangers of serious bodily injury, including permanent disability, paralysis, and death. I agree, on behalf of myself and on behalf of any minor child using the Transport Services for whom I am the parent or legal guardian, that the entire risk arising out of my use of the [employer] remains solely with me, to the maximum extent permitted under applicable law. 7. The [employer] are provided "as is" and "as available." [facility] disclaims all representations and warranties, express, implied or statutory, not expressly set out in these terms, including the implied warranties of merchantability and fitness for a particular purpose. 8. I hereby waive and release [facility], its agents, employees, officers, directors, representatives, insurers, attorneys, assigns, successors, subsidiaries, and affiliates from any and all past, present, or future claims, demands, liabilities, actions, causes of action, or suits of any kind directly or indirectly arising from acceptance and use of the [employer]. 9. I further waive and release [facility] and its affiliates from all present and future liability and responsibility for any injury or death to persons or damages to property caused by or related to the use of the [employer]. 10. I have read this Waiver and Release of Liability, and I understand the terms used in it and their legal significance. This Waiver is freely and voluntarily given with the understanding that my right (as well as the right of any minor child for whom I am the parent or legal guardian using the [employer]) to legal recourse against Alexis Farmer in connection with the [employer] is knowingly surrendered in return for use of these services.   I attest that I read the consent document to Alexis Farmer, gave Alexis Farmer the opportunity to ask questions and answered the questions asked (if any). I affirm that Alexis Farmer then provided consent for she's participation in this program.     Alexis Farmer

## 2020-05-18 ENCOUNTER — Encounter: Payer: Self-pay | Admitting: Dietician

## 2020-05-23 ENCOUNTER — Other Ambulatory Visit: Payer: Self-pay | Admitting: *Deleted

## 2020-05-23 NOTE — Patient Outreach (Signed)
[facility]) Care Management  05/23/2020  Alexis Farmer 1940-08-16 [id number]   CSW received an incoming call from patient today, requesting that [place] arrange transportation for her, through [facility], to two additional physician appointments.  Please see below for completed [place] Patient Intake Request forms, e-mailed to [facility], receiving confirmation of receipt.  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  Patient denied, indicating that she was too tired to complete today, or even answer questions appropriately, encouraging CSW to contact her again in two weeks to assist with completion.  CSW politely reminded patient that [facility] Management is having to pay for all of her transportation arrangements, due to patient residing in [place] but the majority of her physician appointments being located in [place].  CSW further reminded patient that she will need to begin using public transportation through [employer], once approved.  Patient voiced understanding and was agreeable to this plan, in addition to having CSW follow-up with her again in two weeks, on Wednesday, June 08, 2020, around [place] Patient Intake Request   Patients Name Alexis Farmer Patients Phone Number [phone]  Patient DOB Jun 01, 1941 Patient MRN   [id number]   Patient Home Address  [place] [address] Patient Insurance  Medicare [id number]  [place]   Date of Request  Monday, May 23, 2020 Pick Up Date Tuesday, July 05, 2020  Pick up Location [address]  [place] [address] Pick Up Time  9:30AM  Does pt. use cane, walker, transfer wheelchair?   [place] Is patient traveling alone?                   Yes  Dropoff Location [address], [place] [place], [place] [address] Appointment Time                10:20  Should driver wait for pt. for return trip?   *This should only be used for patients with appts. Over 1 hour* Yes, please remain with the patient for the duration of the appointment.  Return [place] up Location [place], [address] [place], [place] [address] Pick up Time Directly after appointment.  Does pt. use cane, walker, transfer wheelchair?  [place] Is patient traveling alone? Yes  Dropoff/Return Location  [address]  [place] [address]  Intake Logistics   Department  [facility]  [phone]  Ride Scheduled by: (TC Name) [name], LCSW Pt. Referred By [name], [place] Patient Intake Request   Patients Name Alexis Farmer Patients Phone Number [phone]  Patient DOB 1941-04-15 Patient MRN   [id number]   Patient Home Address  [place] [address] Patient Insurance  Medicare [id number]  [place]   Date of Request  Monday, May 23, 2020 Pick Up Date Thursday, July 21, 2020  Pick up Location [address]  [place] [address] Pick Up Time  9:30AM  Does pt. use cane, walker, transfer wheelchair?  [place] Is patient traveling alone?  Yes  Dropoff Location Dr. Aundra Dubin  [address]   [place] [address] [place], [place] [address] Appointment Time                10:15AM  Should driver wait for pt. for return trip?   *This should only be used for patients with appts. Over 1 hour* Yes, please remain with the patient for the duration of the appointment.  Return [place] up Location Dr. Aundra Dubin  [place] [address], [place] [address] Pick up Time Directly after appointment.  Does pt. use  cane, walker, transfer wheelchair?  [place] Is patient traveling alone? Yes  Dropoff/Return Location  [address]  [place] [address]  Intake Logistics   Department  [facility] Cost [facility]  [phone]  Ride Scheduled by: (TC Name) [name], LCSW Pt. Referred By [name], LCSW   [name], BSW, MSW, [place]  Licensed Clinical Social Worker  [place]  Mailing [place]. [address], [place], [place] [address] Physical Address-[address], [place], [place] [address] Toll Free Main # [phone] Fax # [phone] Cell # [phone]  [name]

## 2020-05-24 ENCOUNTER — Telehealth: Payer: Self-pay | Admitting: Cardiovascular Disease

## 2020-05-24 ENCOUNTER — Other Ambulatory Visit (INDEPENDENT_AMBULATORY_CARE_PROVIDER_SITE_OTHER): Payer: Self-pay | Admitting: Nurse Practitioner

## 2020-05-24 ENCOUNTER — Telehealth (INDEPENDENT_AMBULATORY_CARE_PROVIDER_SITE_OTHER): Payer: Self-pay | Admitting: Vascular Surgery

## 2020-05-24 DIAGNOSIS — I739 Peripheral vascular disease, unspecified: Secondary | ICD-10-CM

## 2020-05-24 NOTE — Telephone Encounter (Signed)
sent 

## 2020-05-24 NOTE — Telephone Encounter (Signed)
Any specific provider you would like him to see in [place].

## 2020-05-24 NOTE — Telephone Encounter (Signed)
Duplicate encounter. Message already sent to Dr Rockey Situ to confirm ok and to suggest provider for patient.

## 2020-05-24 NOTE — Telephone Encounter (Signed)
Patient has moved to [place] and relies on transportation which will only service [place]. Patient would like a recommendation and referral to a cardiologist in [place] that would be a good fit  Please advise

## 2020-05-24 NOTE — Telephone Encounter (Signed)
Called stating that she would like to be seen at [facility] [place] due to lack of public transportation and asked if we can send everything over so that she can be seen there.

## 2020-05-24 NOTE — Telephone Encounter (Signed)
Patient would like a referral  sent to [facility] in [facility]

## 2020-05-24 NOTE — Telephone Encounter (Signed)
Pt called in and stated she would like to switch from dr Gallon to someone on [address] because her transportation will not take her to [place] number [phone]

## 2020-05-25 ENCOUNTER — Ambulatory Visit: Payer: Self-pay | Admitting: *Deleted

## 2020-05-25 NOTE — Telephone Encounter (Signed)
Left detailed voicemail message that Dr. Rockey Situ said that any provider would be fine and I would have their scheduling team to call to assist with scheduling.

## 2020-05-25 NOTE — Telephone Encounter (Signed)
Spoke with patient reporting she hates to change providers but that his transportation does not come to [place]. Advised I would have them call to set up appointment. She was very appreciative with no further questions at this time.

## 2020-05-25 NOTE — Telephone Encounter (Signed)
We can set up a visit with any provider [facility] cardiology [facility] First available should be fine

## 2020-05-26 ENCOUNTER — Telehealth: Payer: Self-pay | Admitting: Cardiovascular Disease

## 2020-05-26 ENCOUNTER — Telehealth: Payer: Self-pay | Admitting: Internal Medicine

## 2020-05-26 ENCOUNTER — Other Ambulatory Visit: Payer: Self-pay | Admitting: *Deleted

## 2020-05-26 NOTE — Telephone Encounter (Signed)
   Patient no longer requesting TOC appointment She only wants to see female provider

## 2020-05-26 NOTE — Telephone Encounter (Signed)
New Message:     Pt needs to switch doctors, because she lives in [place] and Transportation can not transport her to [facility] [employer]

## 2020-05-26 NOTE — Telephone Encounter (Signed)
    Patient calling to request [facility] appointment. She no longer has transportation to [facility] [employer] location

## 2020-05-26 NOTE — Telephone Encounter (Signed)
Patient is following up on her phone call from yesterday. She would like to transfer her care to [facility]. Please call to discuss.

## 2020-05-26 NOTE — Telephone Encounter (Signed)
That is fine with me.   [name] T. Audie Box, [place]  [address], [place], [place] [address] [phone]  5:16 PM

## 2020-05-26 NOTE — Patient Outreach (Signed)
[facility]) Care Management  05/26/2020  [name] 1941/06/10 [id number]  CSW received an incoming call from patient today, requesting that [place] arrange transportation for her to her mammography appointment, scheduled for Saturday, July 09, 2020 at 10:30AM, at [facility].  CSW has completed a Risk analyst Patient Intake Request form and e-mailed it to [employer].  Please see below for appointment specifics.  CSW will still plan to follow-up with patient again on Wednesday, June 08, 2020, around [place] Patient Intake Request   Patients Name                       Alexis Farmer Patients Phone Number [phone]  Patient DOB                       [birth year] Patient MRN   [id number]    Patient Home Address  [address] [place], [place] [address] Patient Insurance  [place] PART A AND B      Pick Up Ride Information   Date of Request  Thursday, May 26, 2020 Pick Up Date Saturday, July 09, 2020  Pick up Location [address]                  [place], [place] [address] Pick Up Time  9:45AM  Does pt. use cane, walker, transfer wheelchair?  [place] Is patient traveling alone?                   Yes  Dropoff Location [facility] [address], [place], [place] [address] Appointment Time 10:30AM  Should driver wait for pt. for return trip?   *This should only be used for patients with appts. Over 1 hour* Yes, please remain with the patient for the duration of the appointment.  Return [place] up [facility] [address] [place], [place] [address] Pick up Time Directly after appointment.  Does pt. use cane, walker, transfer wheelchair?  [place] Is patient traveling alone? Yes  Dropoff/Return Location  [address] [place], [place] [address]   [place]  [phone]  Ride Scheduled by: (TC Name) [name], LCSW Pt. Referred By [name], LCSW    [name], BSW, MSW, [place]  Licensed Clinical Social Worker  [place]  Mailing [place]. [address] [place], [place] [address] Physical Address-[address], [place], [place] [address] Toll Free Main # [phone] Fax # [phone] Cell # [phone]  [name]

## 2020-05-29 NOTE — Telephone Encounter (Signed)
Yes, she mentioned the issue with transportation Fine with me  thx

## 2020-05-30 ENCOUNTER — Encounter: Payer: Self-pay | Admitting: Internal Medicine

## 2020-05-30 DIAGNOSIS — H3500 Unspecified background retinopathy: Secondary | ICD-10-CM

## 2020-05-30 DIAGNOSIS — E11319 Type 2 diabetes mellitus with unspecified diabetic retinopathy without macular edema: Secondary | ICD-10-CM | POA: Insufficient documentation

## 2020-05-31 ENCOUNTER — Other Ambulatory Visit: Payer: Self-pay | Admitting: *Deleted

## 2020-05-31 ENCOUNTER — Encounter: Payer: Self-pay | Admitting: *Deleted

## 2020-05-31 NOTE — Patient Outreach (Addendum)
[facility]) Care Management  05/31/2020  Alexis Farmer 27-Jun-1940 [id number]   CSW received an incoming call from patient today, requesting that [place] arrange transportation for her to her appointment with Dr. Eleonore Chiquito, Cardiologist with [facility], scheduled for Wednesday, June 29, 2020 at 11:00AM.  CSW completed the [employer] Patient Intake Request Form and e-mailed directly to [facility] for processing.  Please see below for attached request form.  Moving forward, CSW encouraged patient to contact [place] directly when she needs transportation arranged to and from physician appointments, ensuring that she has the correct contact information for [employer].  Patient voiced understanding and was agreeable to this plan.    [place] Patient Intake Request   Patients Name                       Alexis Farmer Patients Phone Number [phone]  Patient DOB                       [birth year] Patient MRN   [id number]    Patient Home Address  [address] [place], [place] [address] Patient Insurance  [place] PART A AND B      Pick Up Ride Information   Date of Request  Tuesday, May 31, 2020 Pick Up Date Wednesday, June 29, 2020  Pick up Location [address]                  [place], [place] [address] Pick Up Time  10:15AM  Does pt. use cane, walker, transfer wheelchair?  [place] Is patient traveling alone?                   Yes  Dropoff Location Dr. [name] [address], [place], [place] [address] Appointment Time 11:00AM  Should driver wait for pt. for return trip?   *This should only be used for patients with appts. Over 1 hour* Yes, please remain with the patient for the duration of the appointment.  Return [place] up Location Dr.  [name] [address], [place], [place] [address] Pick up Time Directly after appointment.  Does pt. use cane, walker, transfer wheelchair?  [place] Is patient traveling alone? Yes  Dropoff/Return Location  [address], [address] [place], [place] [address]  [place]  [phone]  Ride Scheduled by: (TC Name) [name], LCSW Pt. Referred By [name], LCSW   CSW will perform a case closure on patient, as all goals of treatment have been met from social work standpoint and no additional social work needs have been identified at this time.  CSW will notify patient's RNCM with [facility] Management, Jackelyn Poling of CSW's plans to close patient's case, as well as route this note.  CSW will fax an update to patient's Primary Care Physician, Dr. Olivia Mackie McLean-Scocuzza to ensure that they are aware of CSW's involvement with patient's plan of care, in addition to routing a Physician Case Closure Letter.    [name], BSW, MSW, LCSW  Licensed Education officer, [facility]  Mailing [place] [address], [place], [place] [address] Physical Address-[address] [place], [place],  [address] Toll Free Main # [phone] Fax # [phone] Cell # [phone]  [name]

## 2020-06-01 ENCOUNTER — Telehealth: Payer: Self-pay | Admitting: Internal Medicine

## 2020-06-01 DIAGNOSIS — E785 Hyperlipidemia, unspecified: Secondary | ICD-10-CM

## 2020-06-01 DIAGNOSIS — I10 Essential (primary) hypertension: Secondary | ICD-10-CM

## 2020-06-01 MED ORDER — ROSUVASTATIN CALCIUM 10 MG PO TABS: ORAL_TABLET | ORAL | 3 refills | Status: DC

## 2020-06-01 MED ORDER — CLONIDINE HCL 0.2 MG PO TABS: 0.2000 mg | ORAL_TABLET | Freq: Three times a day (TID) | ORAL | 3 refills | Status: DC

## 2020-06-01 NOTE — Telephone Encounter (Signed)
Patient needs the following refills; test stripes- atcu*chek guide, rosuvastatin (CRESTOR) 10 MG tablet, cloNIDine (CATAPRES) 0.2 MG tablet

## 2020-06-03 ENCOUNTER — Other Ambulatory Visit: Payer: Self-pay

## 2020-06-03 DIAGNOSIS — N1832 Chronic kidney disease, stage 3b: Secondary | ICD-10-CM

## 2020-06-03 DIAGNOSIS — N183 Chronic kidney disease, stage 3 unspecified: Secondary | ICD-10-CM

## 2020-06-03 DIAGNOSIS — E1122 Type 2 diabetes mellitus with diabetic chronic kidney disease: Secondary | ICD-10-CM

## 2020-06-03 DIAGNOSIS — E1165 Type 2 diabetes mellitus with hyperglycemia: Secondary | ICD-10-CM

## 2020-06-03 DIAGNOSIS — Z794 Long term (current) use of insulin: Secondary | ICD-10-CM

## 2020-06-03 DIAGNOSIS — IMO0002 Reserved for concepts with insufficient information to code with codable children: Secondary | ICD-10-CM

## 2020-06-03 MED ORDER — GLUCOSE BLOOD VI STRP: ORAL_STRIP | 12 refills | Status: DC

## 2020-06-03 MED ORDER — INSULIN REGULAR HUMAN 100 UNIT/ML IJ SOLN: INTRAMUSCULAR | 11 refills | Status: DC

## 2020-06-08 ENCOUNTER — Ambulatory Visit: Payer: Self-pay | Admitting: *Deleted

## 2020-06-20 ENCOUNTER — Ambulatory Visit: Payer: Medicare Other | Admitting: Cardiovascular Disease

## 2020-06-26 IMAGING — DX DG ANKLE COMPLETE 3+V*R*
3 series · 3 of 3 positions shown · non-contrast
Comparison: None.

CLINICAL DATA: Injury from fall, right ankle pain.

EXAM:
RIGHT ANKLE - COMPLETE 3+ VIEW

[ankle ap]
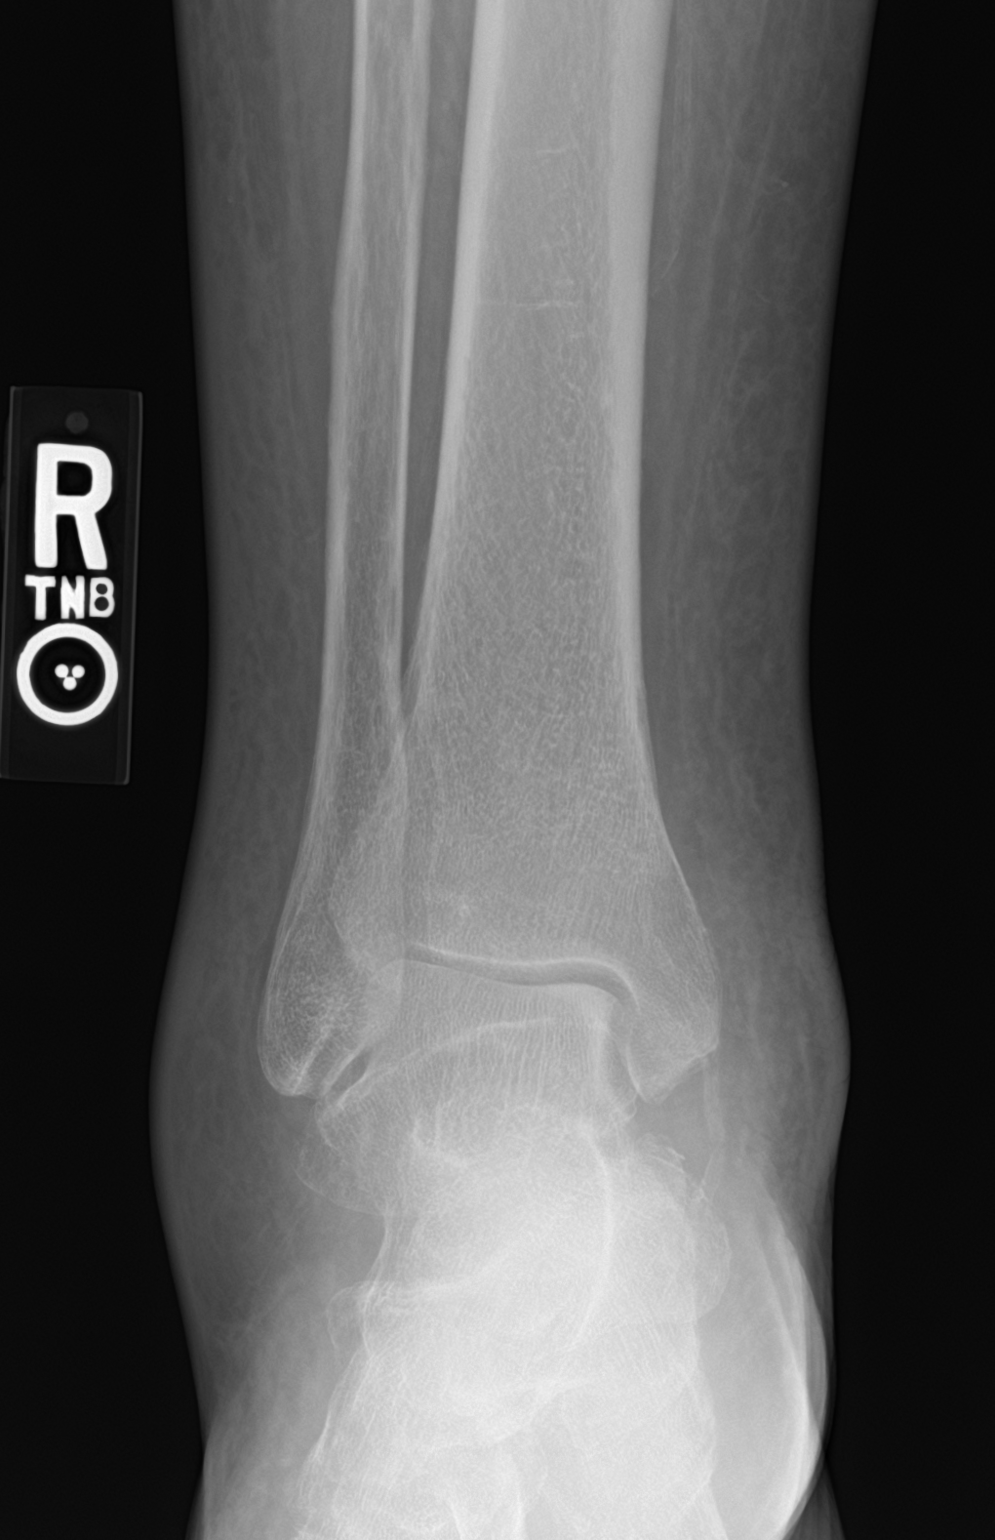

[ankle obl]
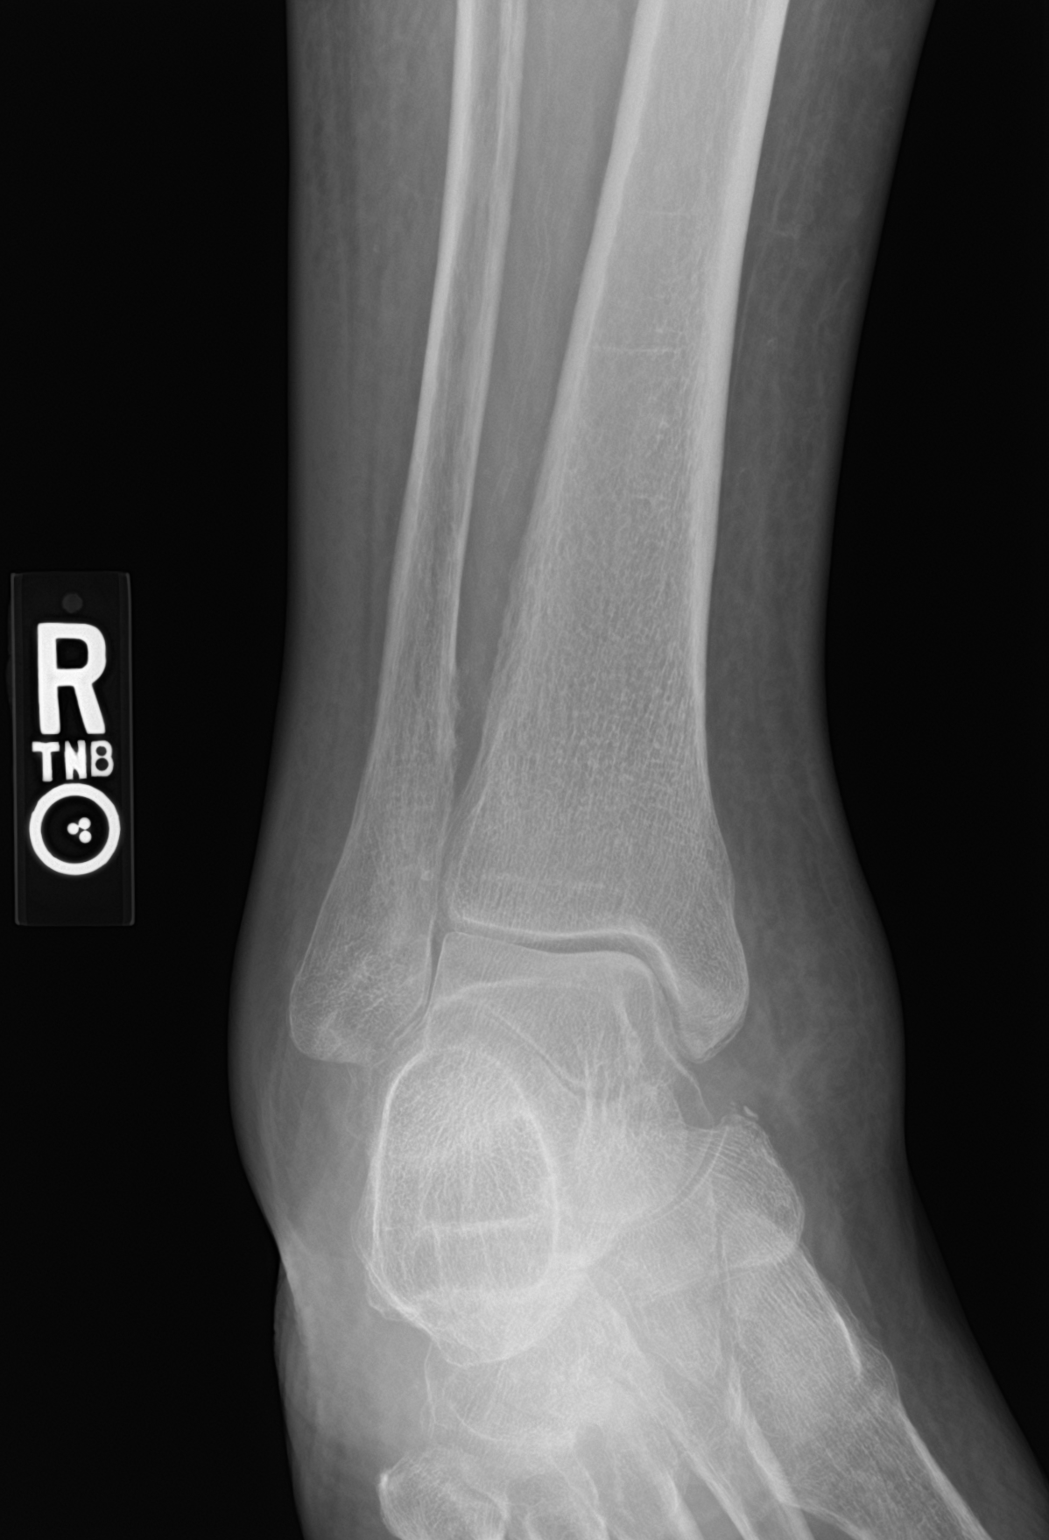

[ankle lat]
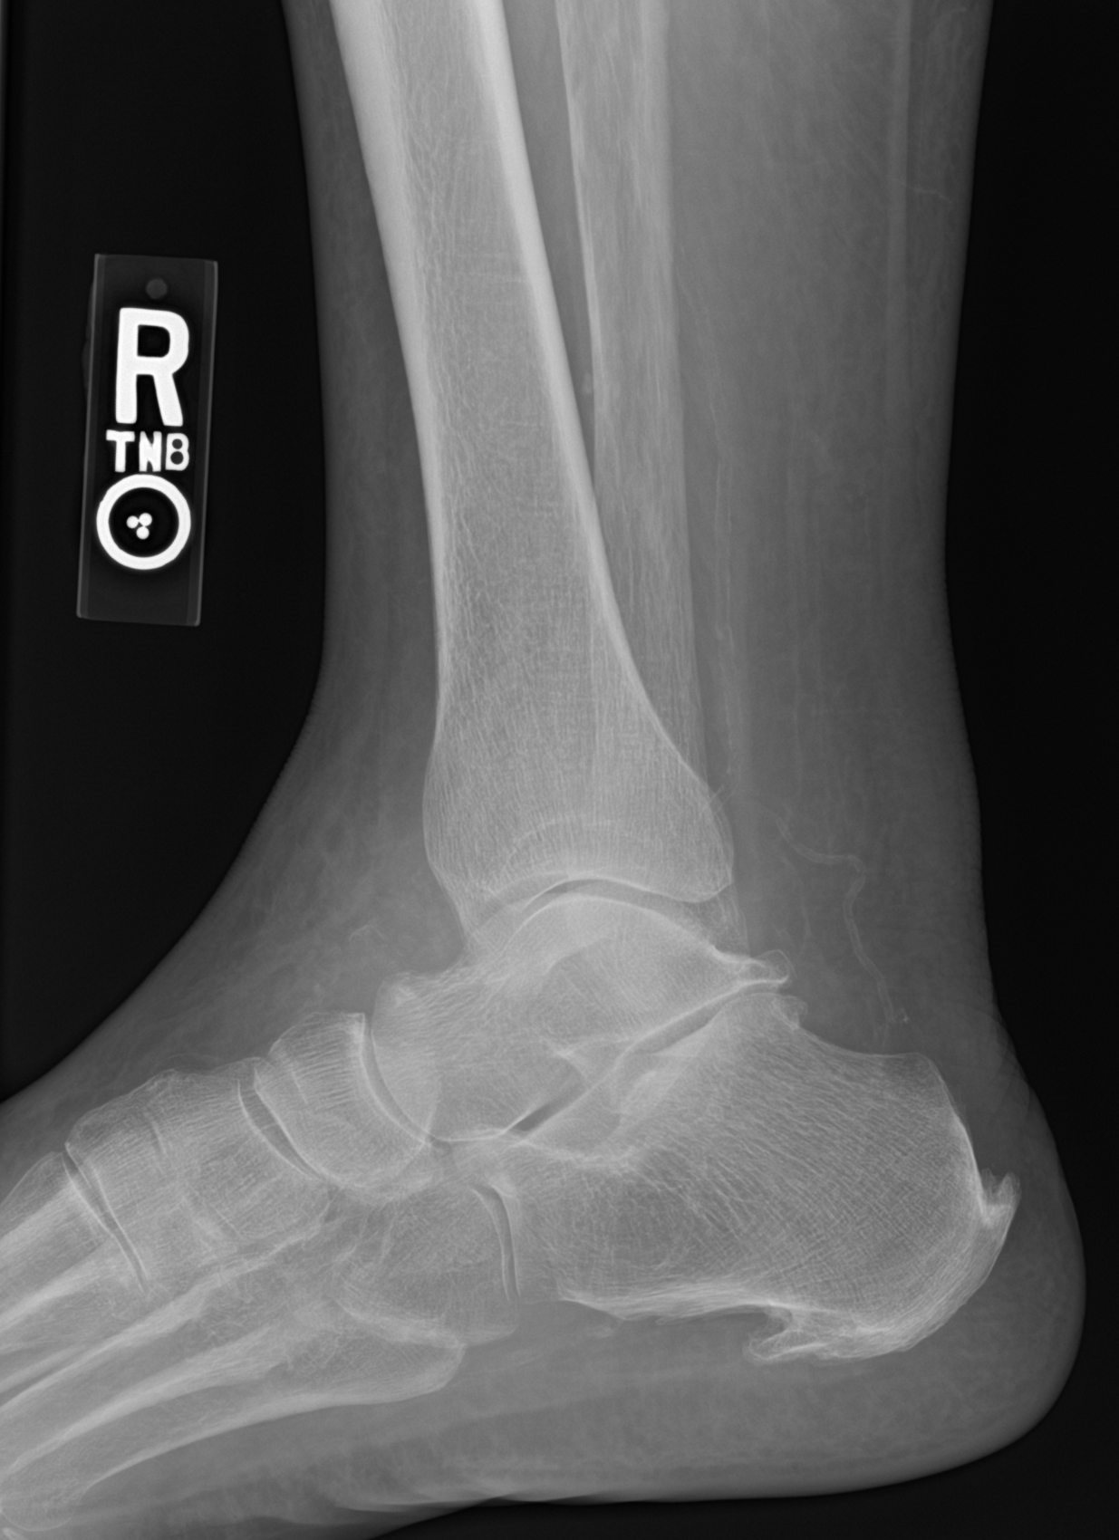

[3 of 3 positions shown; findings below may reference images not displayed]

FINDINGS: Cortical regularity along the medial aspect of navicular seen on
only one view. No additional bony abnormality or fracture sign from
degenerative change.
IMPRESSION: Small presumed avulsion fracture of the medial navicular. Dedicated
views of the foot may be helpful.

## 2020-06-26 NOTE — Progress Notes (Signed)
Cardiology Office Note:   Date:  06/29/2020  NAME:  Alexis Farmer    MRN: [id number] DOB:  [birth year]   PCP:  McLean-Scocuzza, Nino Glow, MD  Cardiologist:  No primary care provider on file.   Referring MD: McLean-Scocuzza, Olivia Mackie *   Chief Complaint  Patient presents with  . Follow-up   History of Present Illness:   Alexis Farmer is a 80 y.o. female with a hx of carotid artery disease, HLD, DM, HFPEF, HTN who presents for follow-up. Was followed in [place] and transition to [place]. Blood pressure elevated 162/86.  No pressure cuff at home.  She reports she had 1 but it does not work anymore.  She apparently was evaluated by EMS about 1 month ago.  She woke up with her heart racing and EKG was performed.  This showed normal heart rhythm and no arrhythmias.  She did not go to the emergency room.  Regarding her blood pressure she has had amlodipine 10 mg a day, Aldactone 25 mg a day, clonidine 0.2 mg twice daily.  She is not on an ACE or an ARB.  Kidney function okay.  She has uncontrolled diabetes.  She also has increased lower extremity edema.  She takes torsemide 10 mg a day.  She does need to take this again.  We also discussed Jardiance which could help with her volume status.  She is okay to try this.  Most recent LDL cholesterol 71.  This is not at goal.  She did have carotid ultrasound shows mild to moderate carotid artery stenoses.  She needs repeat studies yearly.  She denies any chest pain or shortness of breath.  She does not exercise.  She is a wheelchair today.  She is also morbidly obese with a BMI of 46.  No sleep apnea per her report.  Problem list 1. DM -A1c 10.0 2. Carotid artery disease -R ICA 40-59% -R subclavian steal  -L ICA 1-39% 3. HLD -T chol 155, HDL 68, LDL 71, TG 81 4. HFpEF 5. HTN  Past Medical History: Past Medical History:  Diagnosis Date  . Arthritis   . Chickenpox   . Diabetes mellitus without complication ([place])    one elevated reading/ no  treatment  . Diverticulitis   . GI bleed   . High cholesterol   . History of blood transfusion   . Hypertension   . Renal insufficiency     Past Surgical History: Past Surgical History:  Procedure Laterality Date  . APPENDECTOMY    . CHOLECYSTECTOMY    . ECTOPIC PREGNANCY SURGERY    . EYE SURGERY     bilateral cataracts  . EYE SURGERY     02/11/2019 repair hole in right eye   . gallbladder     . HYSTEROSCOPY WITH D & C N/A 10/26/2016   Procedure: DILATATION AND CURETTAGE /HYSTEROSCOPY;  Surgeon: Benjaman Kindler, MD;  Location: [facility] ORS;  Service: Gynecology;  Laterality: N/A;  . HYSTEROSCOPY WITH D & C N/A 07/07/2018   Procedure: DILATATION AND CURETTAGE /HYSTEROSCOPY;  Surgeon: Benjaman Kindler, MD;  Location: [facility] ORS;  Service: Gynecology;  Laterality: N/A;  . THYROIDECTOMY, PARTIAL      Current Medications: Current Meds  Medication Sig  . acetaminophen (TYLENOL) 500 MG tablet Take 500 mg by mouth every 6 (six) hours as needed.   Marland Kitchen amLODipine (NORVASC) 10 MG tablet Take 1 tablet (10 mg total) by mouth daily.  . Ascorbic Acid (VITAMIN C PO) Take 500 mg by mouth daily.  Marland Kitchen  blood glucose meter kit and supplies KIT Accu chek, Dx code E11.65, check 3 times daily  . cholecalciferol (VITAMIN D3) 25 MCG (1000 UNIT) tablet Take 1,000 Units by mouth daily.  . cloNIDine (CATAPRES) 0.2 MG tablet Take 1 tablet (0.2 mg total) by mouth 3 (three) times daily.  . diclofenac Sodium (VOLTAREN) 1 % GEL Apply 2 g topically 4 (four) times daily. Upper body and 4 gram qid prn lower body arthritis pain/musculoskeletal pain this is also OTC  . glucose 4 GM chewable tablet Chew 1 tablet (4 g total) by mouth as needed for low blood sugar. <70  . glucose blood test strip Use as instructed tid  Accuchek Guide  . insulin NPH Human (NOVOLIN N) 100 UNIT/ML injection INJECT 20 UNITS IN THE MORNING AND 20 UNITS IN THE EVENING WITH A MEAL (pt actually taking 20 qam and 15 units qpm)  . insulin regular (NOVOLIN  R) 100 units/mL injection 15 minutes before meals tid. 131-180 2 units 181 to 240 4 units 241-300 6units 301-350 8 units 351-400 10 units>400 12 units.  Keep glucose tablets for sugar < 70  . Insulin Syringe-Needle U-100 (B-D INS SYR ULTRAFINE .5CC/30G) 30G X 1/2" 0.5 ML MISC 1 Device by Does not apply route daily. Up to 5x per day with insulin bid (NPH) and tid (Regular)  . meclizine (ANTIVERT) 12.5 MG tablet Take 1 tablet (12.5 mg total) by mouth daily as needed for dizziness.  . Multiple Vitamin (MULTIVITAMIN WITH MINERALS) TABS tablet Take 1 tablet by mouth daily.  . mupirocin ointment (BACTROBAN) 2 % Apply 1 application topically 2 (two) times daily. Right index finger and left forearm  . polyethylene glycol powder (GLYCOLAX/MIRALAX) 17 GM/SCOOP powder Take 17 g by mouth daily as needed for moderate constipation or severe constipation. Can take up to 2x per day prn. Mix with 8 ounces of liquid  . spironolactone (ALDACTONE) 25 MG tablet Take 1 tablet (25 mg total) by mouth daily.  . [DISCONTINUED] rosuvastatin (CRESTOR) 10 MG tablet TAKE 1 TABLET (10 MG TOTAL) BY MOUTH DAILY.  . [DISCONTINUED] torsemide (DEMADEX) 10 MG tablet Take 10 mg by mouth daily as needed.     Allergies:    Penicillins   Social History: Social History   Socioeconomic History  . Marital status: Widowed    Spouse name: Not on file  . Number of children: 1  . Years of education: 33  . Highest education level: 12th grade  Occupational History  . 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 Smoking status: Never Smoker  . Smokeless tobacco: Never Used  Vaping Use  . Vaping Use: Never used  Substance and Sexual Activity  . Alcohol use: No  . Drug use: No  . Sexual activity: Not Currently  Other Topics Concern  . 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 Difficulty of Paying Living Expenses: Not very hard  Food Insecurity: No Food Insecurity  . Worried About Charity fundraiser in the Last Year: Never true  . Ran Out of Food in the Last Year: Never true  Transportation Needs: No Transportation Needs  . Lack of Transportation (  Medical): No  . Lack of Transportation (Non-Medical): No  Physical Activity: Inactive  . Days of Exercise per Week: 0 days  . Minutes of Exercise per Session: 0 min  Stress: Stress Concern Present  . Feeling of Stress : Rather much  Social Connections: Moderately Isolated  . Frequency of Communication with Friends and Family: More than three times a week  . Frequency of Social Gatherings with Friends and Family: More than three times a week  . Attends Religious Services: More than 4 times per year  . Active Member of Clubs or Organizations: No  . Attends Archivist Meetings: Never  . Marital Status: Widowed     Family History: The patient's family history includes Diabetes in her mother, sister, and another family member; Healthy in her father; Heart disease in her sister; Hypertension in her mother; Stroke in her mother.  ROS:   All other ROS reviewed and negative. Pertinent positives noted in the HPI.     EKGs/Labs/Other Studies Reviewed:   The following studies were personally reviewed by me today:   Echo 06/25/2016 [facility]) EF 60-65%, G1DD  Carotid US Bilateral Summary:  Right Carotid: Velocities in the right ICA are consistent with a 40-59%         stenosis. The ECA appears >50% stenosed. Right ICA is  consistant         with a 40-59% stenosis. Abnormal vertebral and subclavian  artery         waveforms as well as a 11m/HG difference in the brachial         pressures suggestive of a right subclavian steal.    Left Carotid: Velocities in the left ICA are consistent with a 1-39%  stenosis.        The ECA appears <50% stenosed. Left ICA velocity is  consistant        with a 1-39% stenosis. No significant changed as compared to  the        previous exam on 08/13/2018.   Vertebrals: Left vertebral artery demonstrates antegrade flow. Right  vertebral        artery demonstrates retrograde flow.  Subclavians: Normal flow hemodynamics were seen in bilateral subclavian        arteries.   Recent Labs: 10/24/2019: ALT 34 03/23/2020: BUN 20; Creatinine, Ser 1.07; Hemoglobin 13.9; Platelets 206.0; Potassium 4.2; Sodium 140; TSH 2.00   Recent Lipid Panel    Component Value Date/Time   CHOL 155 03/23/2020 1301   TRIG 81.0 03/23/2020 1301   HDL 67.90 03/23/2020 1301   CHOLHDL 2 03/23/2020 1301   VLDL 16.2 03/23/2020 1301   LDLCALC 71 03/23/2020 1301   LDLDIRECT 96.0 10/28/2018 0951    Physical Exam:   VS:  BP (!) 162/86   Pulse 86   Ht '5\' 3"'  (1.6 m)   Wt 263 lb 3.2 oz (119.4 kg)   SpO2 96%   BMI 46.62 kg/m    Wt Readings from Last 3 Encounters:  06/29/20 263 lb 3.2 oz (119.4 kg)  03/23/20 251 lb 3.2 oz (113.9 kg)  03/09/20 257 lb (116.6 kg)    General: Well nourished, well developed, in no acute distress Head: Atraumatic, normal size  Eyes: PEERLA, EOMI  Neck: Supple, no JVD, bilateral carotid bruits Endocrine: No thryomegaly Cardiac: Normal S1, S2; RRR; no murmurs, rubs, or gallops Lungs: Clear to auscultation bilaterally, no wheezing, rhonchi or rales  Abd: Soft, nontender, no hepatomegaly  Ext: Trace edema Musculoskeletal: No deformities, BUE and  BLE strength normal and equal Skin: Warm and dry, no rashes   Neuro: Alert and oriented to person, place, time, and situation, CNII-XII grossly intact, no focal deficits  Psych: Normal mood and affect   ASSESSMENT:   Alexis Farmer is a 80 y.o. female who presents for the following: 1. Chronic  diastolic heart failure ([place])   2. Essential hypertension   3. Bilateral carotid artery stenosis   4. Mixed hyperlipidemia   5. Hyperlipidemia, unspecified hyperlipidemia type     PLAN:   1. Chronic diastolic heart failure (HCC) -Trace edema on examination. Weights have increased. Increase torsemide to 20 mg daily. Advised to cut back on salt consumption. Also needs to watch her blood pressure. Compression stockings recommended as well. -We discussed adding Jardiance 10 mg a day. Samples given. She is reluctant to do this. When I see her back in several months we will discuss further. She will see how she does with samples. If she tolerates the medicine well I will go ahead and prescribe.  2. Essential hypertension -BP elevated today. Unclear what she wants at home. We will continue her home clonidine 0.2 mg 3 times daily, amlodipine 10 mg daily, Aldactone 25 mg a day. -I want her to keep a blood pressure log. Reduce salt consumption.  -We will plan to see her back to reassess in several months.  3. Bilateral carotid artery stenosis --R ICA 40-59% -R subclavian steal  -L ICA 1-39% -Aspirin 81 mg a day. Most recent LDL cholesterol 71. Increase Crestor to 40 mg a day. -Repeat carotids this year.  4. Mixed hyperlipidemia -Increase Crestor to 20 mg a day.  Disposition: Return in about 4 months (around 10/27/2020).  Medication Adjustments/Labs and Tests Ordered: Current medicines are reviewed at length with the patient today.  Concerns regarding medicines are outlined above.  Orders Placed This Encounter  Procedures  . VAS US CAROTID   Meds ordered this encounter  Medications  . rosuvastatin (CRESTOR) 20 MG tablet    Sig: TAKE 1 TABLET (10 MG TOTAL) BY MOUTH DAILY.    Dispense:  90 tablet    Refill:  1  . torsemide (DEMADEX) 20 MG tablet    Sig: Take 1 tablet (20 mg total) by mouth daily.    Dispense:  90 tablet    Refill:  1    Patient Instructions  Medication Instructions:   Increased Crestor to 20 mg daily  Increased Torsemide to 20 mg daily  Start Aspirin 81 mg (over the counter)  *If you need a refill on your cardiac medications before your next appointment, please call your pharmacy*  Testing/Procedures: Your physician has requested that you have a carotid duplex. This test is an ultrasound of the carotid arteries in your neck. It looks at blood flow through these arteries that supply the brain with blood. Allow one hour for this exam. There are no restrictions or special instructions.   Follow-Up: At [facility], you and your health needs are our priority.  As part of our continuing mission to provide you with exceptional heart care, we have created designated Provider Care Teams.  These Care Teams include your primary Cardiologist (physician) and Advanced Practice Providers (APPs -  Physician Assistants and Nurse Practitioners) who all work together to provide you with the care you need, when you need it.  We recommend signing up for the patient portal called "MyChart".  Sign up information is provided on this After Visit Summary.  MyChart is used to connect  with patients for Virtual Visits (Telemedicine).  Patients are able to view lab/test results, encounter notes, upcoming appointments, etc.  Non-urgent messages can be sent to your provider as well.   To learn more about what you can do with MyChart, go to [email or web address].    Your next appointment:   4 month(s)  The format for your next appointment:   In Person  Provider:   Eleonore Chiquito, MD   Other Instructions Take blood pressure every day- different times. Use log, and bring back with you to follow up appointment.      Time Spent with Patient: I have spent a total of 35 minutes with patient reviewing hospital notes, telemetry, EKGs, labs and examining the patient as well as establishing an assessment and plan that was discussed with the patient.  > 50% of time was spent in direct  patient care.  Signed, Addison Naegeli. Audie Box, [place]  [address] [place], [place] [address] [phone]  06/29/2020 1:18 PM

## 2020-06-29 ENCOUNTER — Telehealth: Payer: Self-pay | Admitting: Cardiovascular Disease

## 2020-06-29 ENCOUNTER — Encounter: Payer: Self-pay | Admitting: Cardiovascular Disease

## 2020-06-29 ENCOUNTER — Ambulatory Visit (INDEPENDENT_AMBULATORY_CARE_PROVIDER_SITE_OTHER): Payer: Medicare Other | Admitting: Cardiovascular Disease

## 2020-06-29 ENCOUNTER — Other Ambulatory Visit: Payer: Self-pay

## 2020-06-29 VITALS — BP 162/86 | HR 86 | Ht 63.0 in | Wt 263.2 lb

## 2020-06-29 DIAGNOSIS — E785 Hyperlipidemia, unspecified: Secondary | ICD-10-CM

## 2020-06-29 DIAGNOSIS — I5032 Chronic diastolic (congestive) heart failure: Secondary | ICD-10-CM | POA: Diagnosis not present

## 2020-06-29 DIAGNOSIS — I6523 Occlusion and stenosis of bilateral carotid arteries: Secondary | ICD-10-CM | POA: Diagnosis not present

## 2020-06-29 DIAGNOSIS — E782 Mixed hyperlipidemia: Secondary | ICD-10-CM | POA: Diagnosis not present

## 2020-06-29 DIAGNOSIS — I10 Essential (primary) hypertension: Secondary | ICD-10-CM

## 2020-06-29 MED ORDER — ROSUVASTATIN CALCIUM 20 MG PO TABS: ORAL_TABLET | ORAL | 1 refills | Status: DC

## 2020-06-29 MED ORDER — TORSEMIDE 20 MG PO TABS: 20.0000 mg | ORAL_TABLET | Freq: Every day | ORAL | 1 refills | Status: DC

## 2020-06-29 NOTE — Telephone Encounter (Signed)
New Message:     Pt said the doctor order Crestor for her and she have not been taking that.

## 2020-06-29 NOTE — Telephone Encounter (Signed)
Let's prescribe lipitor 40 mg daily.   She told me she was taking crestor during her visit. Not sure what happened.   [name] T. Audie Box, [place]  [address] [place], [place] [address] [phone]  8:15 PM

## 2020-06-29 NOTE — Telephone Encounter (Signed)
Clarified Per Dr. Audie Box pt reported taking Crestor 10mg  once a day and he increased the dose to 20mg .    Called pt back. Pt states that she has not been taking crestor for 2 years now and does not know why this medication is still on her list. Pt requests for medication to be removed from list and for Dr. Audie Box to prescribe another statin that will not damage the kidneys.

## 2020-06-29 NOTE — Patient Instructions (Signed)
Medication Instructions:  Increased Crestor to 20 mg daily  Increased Torsemide to 20 mg daily  Start Aspirin 81 mg (over the counter)  *If you need a refill on your cardiac medications before your next appointment, please call your pharmacy*  Testing/Procedures: Your physician has requested that you have a carotid duplex. This test is an ultrasound of the carotid arteries in your neck. It looks at blood flow through these arteries that supply the brain with blood. Allow one hour for this exam. There are no restrictions or special instructions.   Follow-Up: At [facility], you and your health needs are our priority.  As part of our continuing mission to provide you with exceptional heart care, we have created designated Provider Care Teams.  These Care Teams include your primary Cardiologist (physician) and Advanced Practice Providers (APPs -  Physician Assistants and Nurse Practitioners) who all work together to provide you with the care you need, when you need it.  We recommend signing up for the patient portal called "MyChart".  Sign up information is provided on this After Visit Summary.  MyChart is used to connect with patients for Virtual Visits (Telemedicine).  Patients are able to view lab/test results, encounter notes, upcoming appointments, etc.  Non-urgent messages can be sent to your provider as well.   To learn more about what you can do with MyChart, go to [email or web address].    Your next appointment:   4 month(s)  The format for your next appointment:   In Person  Provider:   Eleonore Chiquito, MD   Other Instructions Take blood pressure every day- different times. Use log, and bring back with you to follow up appointment.

## 2020-06-29 NOTE — Telephone Encounter (Signed)
Pt report she wasn't giving instructions on how to take Jardiance 10 mg. Pt informed that MD recommended one tablet daily. Pt verbalized understanding.

## 2020-06-29 NOTE — Telephone Encounter (Signed)
Spoke with pt on the phone. Pt states that she didn't realize that Dr. Audie Box was placing her back on crestor.  Pt states that she was specifically told not to take this statin because it was cause harm to her kidneys. Pt asks if Dr. Audie Box would like to prescribe another statin therapy?

## 2020-06-29 NOTE — Telephone Encounter (Signed)
Pt c/o medication issue:  1. Name of Medication: Jardiance (Empagliflozin) 10 MG  2. How are you currently taking this medication (dosage and times per day)? Not currently taking   3. Are you having a reaction (difficulty breathing--STAT)? No   4. What is your medication issue? Alexis Farmer is calling stating she received samples of this medication at her appointment today, but was not advised on how to take it. Please advise.

## 2020-06-30 ENCOUNTER — Other Ambulatory Visit: Payer: Self-pay | Admitting: *Deleted

## 2020-06-30 ENCOUNTER — Encounter: Payer: Self-pay | Admitting: *Deleted

## 2020-06-30 MED ORDER — ROSUVASTATIN CALCIUM 20 MG PO TABS: ORAL_TABLET | ORAL | 1 refills | Status: DC

## 2020-06-30 NOTE — Addendum Note (Signed)
Addended by: Caprice Beaver T on: 06/30/2020 11:17 AM   Modules accepted: Orders

## 2020-06-30 NOTE — Telephone Encounter (Signed)
Called the patient. She stated that she got confused with the "long name" Rosuvastatin but she is on it currently and will take the increased dose of 20 mg once daily.

## 2020-06-30 NOTE — Patient Outreach (Signed)
[facility]) Care Management  06/30/2020  Alexis Farmer [birth year] [id number]    [facility] outreach to complex care patient Alexis Farmer was referred to [facility] on 11/17/19  Referral source[employer]igna and[employer] leadership for uncontrolled Diabetes A1C of 9.9, SE for possible housing and food resources.  Insurance Medicare, [employer]  Admissions/ED visits None in the last 6 months  Since the last [facility] outreach to Alexis Farmer she had an ED visit on 05/06/20 for otalgia (right ear pain- ear cleaned out and ordered flonase) and a visit by EMS with an ED visit for increase heart rate EMS completed and EKG indicating normal sinus rhythm She concluded [facility] SW services (transportation) on 05/31/20    Follow up She reports she has had "ups and downs" since the last [place] outreach  Meeting goal related to medical transportation  SCAT service requirements have been met with assist of [place] and she is coordinating and getting to all her [place] appointments She assisted with updating her medical providers in [place]  She reports her daughter is still not in good health. This was pt's primary transportation resource She is appreciative of SCAT services and glad to have met this goal She had used uber services with difficulty getting in the back of cars, difficulty lifting her legs  Now she has been changed to door to door services  She has given away her car Has a friend's grandson assisting with grocery shopping for her today He outreached x 3 during this call  ED & EMS visits She reviewed her recent ED visit - Issues resolved She reports she had an EMS visit for a fast heart rate "It was like boom, boom. I felt like my heart was coming out my chest" She reports a dream she had triggered this episode as she woke and jumped out of bed. Admits she thinks she was having a panic attack or terror nightmare after consulting with EMS staff She shared other night mares she has  experienced, "seen demons"  She speaks with family members about these  She continues to prefer her spiritual/ faith values to seeking behavior or counseling services   She showed the EMS report/EKG to her cardiologist who encouraged her to decrease sodium and increase fresh vegetables  Diabetes/CHF/coping She denies any medical changes  No worsening symptoms and "no real changes" still valuing her spiritual and cultural interventions not interested [facility] services- manage my emotions goal completed   Further [facility] services  She voiced appreciation of outreach from [place]  She reports with her being allowed to share she "was awakened"  [facility] RN CM discussed embedded RN CM services with the patient She shared her feelings Warm transfer of services not completed at this time   Plans Patient agrees to care plan and follow up within the next 30-45 business days Pt encouraged to return a call to [facility] RN CM prn Goals Addressed              This Visit's Progress     Patient Stated   .  COMPLETED: [facility]) Find Help in My Community (pt-stated)   On track     Follow Up Date 06/30/20   - begin a notebook of services in my neighborhood or community - follow-up on any referrals for help I am given - have a back-up plan - make a list of family or friends that I can call     Notes:  06/30/20 now has  transportation SCAT services and is managing  coordinating and getting to all her [place] appointments 05/04/20 re referred to [facility] SW for transportation resources and [facility] 2/9 scores    .  COMPLETED: [facility]) Manage My Emotions (pt-stated)   Not on track     Follow Up Date 06/30/20   - call and visit an old friend - talk about feelings with a friend, family or spiritual advisor - practice positive thinking and self-talk   Notes: 06/30/20 still valuing her spiritual and cultural interventions not interested [facility] services   05/04/20 re referred to [facility] SW for transportation resources and [facility]  2/9 scores    .  [facility]) Manage Pain (pt-stated)   On track     Follow Up Date 07/26/20   - develop a personal pain management plan - prioritize tasks for the day - use ice or heat for pain relief - work slower and less intense when having pain   Notes: 06/30/20 She denies any medical changes  No worsening symptoms and "no real changes" 03/31/20 personal pain plan - use of epsom salt water  Has not follow up with chiropractor, MD    .  [facility]) Monitor and Manage My Blood Sugar (pt-stated)   On track     Follow Up Date 07/26/20   - check blood sugar at prescribed times - check blood sugar if I feel it is too high or too low - take the blood sugar meter to all doctor visits     Notes: 06/30/20 She denies any medical changes  No worsening symptoms and "no real changes" 03/31/20 continues to monitor cbgs, reports wt loss of 6 lbs & reduction of hgA1c by 0.4 (10.4 to 10)       .  Kimberly L. Lavina Hamman, RN, BSN, [place] Coordinator Office number [phone] [facility] number [phone] Fax number [phone]

## 2020-06-30 NOTE — Patient Outreach (Signed)
[place] [facility]) Care Management  06/30/2020  [place] Sylvan 08-Mar-1941 [id number]  [facility] outreach to complex care patient Mrs Codie Hainer was referred to [facility] on 11/17/19  Referral source [employer] and [facility] leadership for uncontrolled Diabetes A1C of 9.9, SE for possible housing and food resources.  Insurance Medicare, [employer]  Admissions/ED visits None in the last 6 months  Since the last [facility] outreach to Mrs Bo she had an ED visit on 05/06/20 for otalgia (right ear pain- ear cleaned out and ordered flonase) and a visit by EMS with an ED visit for increase heart rate EMS completed and EKG indicating normal sinus rhythm She concluded [facility] SW services (transportation) on 05/31/20  Successful outreach but pt reports she was working with someone in her home related to a grocery list She requests a return call at a later time    Plans This patient is scheduled to have embedded RN CM services initiated Will provide warm transfer to future services with upcoming outreach   [place] L. Lavina Hamman, RN, BSN, [place] Coordinator Office number [phone] Main [facility] number [phone] Fax number [phone]

## 2020-06-30 NOTE — Telephone Encounter (Signed)
Thank you for calling her [name].

## 2020-06-30 NOTE — Telephone Encounter (Signed)
Thanks [name]!  [name] T. Audie Box, [place]  [address], [place], [place] [address] [phone]  2:02 PM

## 2020-07-01 ENCOUNTER — Ambulatory Visit: Payer: Medicare Other | Admitting: *Deleted

## 2020-07-11 MED ORDER — EMPAGLIFLOZIN 10 MG PO TABS: 10.0000 mg | ORAL_TABLET | Freq: Every day | ORAL | 3 refills | Status: DC

## 2020-07-11 NOTE — Telephone Encounter (Signed)
Yes, please call in.   [name] T. Audie Box, MD, [place]  [address] [place], [place] [address] [phone]  2:37 PM

## 2020-07-11 NOTE — Addendum Note (Signed)
Addended by: Rexanne Mano B on: 07/11/2020 02:45 PM   Modules accepted: Orders

## 2020-07-11 NOTE — Telephone Encounter (Signed)
Patient called and stated that she will be willing to take Jardiance 10 mg. She says that Dr. Audie Box has to put in the prescription for her to pick it up and she needs the 90 day supply so that her insurance can pay for it

## 2020-07-11 NOTE — Telephone Encounter (Signed)
Medication sent in to patient preferred pharmacy. Patient made  aware and verbalized understanding.

## 2020-07-14 ENCOUNTER — Telehealth: Payer: Self-pay | Admitting: Cardiovascular Disease

## 2020-07-14 NOTE — Telephone Encounter (Signed)
Pt c/o medication issue:  1. Name of Medication: empagliflozin (JARDIANCE) 10 MG TABS tablet    2. How are you currently taking this medication (dosage and times per day)? As prescribed.  3. Are you having a reaction (difficulty breathing--STAT)? Fast heartbeat, dizzy, shaking and sweating.  4. What is your medication issue? Patient states that she is having a bad reaction to this medication and no longer wants to take it. Please advise.

## 2020-07-14 NOTE — Telephone Encounter (Signed)
Pt called back in , pt advised of the message to stop meds.  Pt expressed understanding with no additional questions

## 2020-07-14 NOTE — Telephone Encounter (Signed)
Called patient, LVM advising that it was okay to stop medication.  Gave call back number if any other concerns.

## 2020-07-14 NOTE — Telephone Encounter (Signed)
That is fine with me. Agree with stopping.   [name] T. Audie Box, MD, [place]  [address] [place], [place] [address] [phone]  4:30 PM

## 2020-07-14 NOTE — Telephone Encounter (Signed)
Returned call to patient to discuss-she thinks she is experiencing side effects from the jardiance.  She states the day after she started taking this she started experiencing palpitations, headache, generalized weakness, hunger, confusion, and shakiness.    Reports her blood sugar is "ok".   She states she read the paper with the medication and believes her symptoms are side effects from the medication.   She last took it this morning, but is not going to take it again and wanted to let the MD know.    She states she "knows her body and does not wish to mess with it".       Will route to MD to make aware.

## 2020-07-21 ENCOUNTER — Ambulatory Visit: Payer: Medicare Other | Admitting: Internal Medicine

## 2020-07-26 ENCOUNTER — Other Ambulatory Visit: Payer: Self-pay | Admitting: *Deleted

## 2020-07-26 ENCOUNTER — Other Ambulatory Visit: Payer: Self-pay

## 2020-07-26 NOTE — Patient Outreach (Signed)
[place] [facility]) Care Management  07/26/2020  [place] April 25, 1941 [id number]  [facility] outreach to complex care patient Alexis Farmer was referred to [facility] on 11/17/19  Referral source[employer] and[employer] leadership for uncontrolled Diabetes A1C of 9.9, SE for possible housing and food resources.  Insurance Medicare, [employer]  Admissions/ED visits None in the last 6 months ED visit on 05/06/20 for otalgia (right ear pain- ear cleaned out and ordered flonase) and a visit by EMS with an ED visit for increase heart rate EMS completed and EKG indicating normal sinus rhythm She concluded [facility] SW services (transportation) on 05/31/20   Follow up Likes new primary care provider (PCP) Dr Caren Macadam Seen on 07/25/20 She called Tanna Furry to get assist with food and meal resources She called [employer] but ws informed they are only able to assist for immediate post hospitalizations  Other meal on wheel program will not be able to assist her for 6 months  Her MD want her to eat more with her insulin intake   Still having pain "all over my whole body"  Plans Patient agrees to care plan and follow up within the next 30-45 business days Pt encouraged to return a call to [facility] RN CM prn Goals Addressed              This Visit's Progress     Patient Stated   .  [facility]) Manage Pain (pt-stated)   On track     Follow Up Date 08/23/20   - develop a personal pain management plan - prioritize tasks for the day - use ice or heat for pain relief - work slower and less intense when having pain      Notes: 07/26/20 she hs established care with her new pcp, seen on 07/25/20 she discussed her pain with pcp 06/30/20 She denies any medical changes  No worsening symptoms and "no real changes" 03/31/20 personal pain plan - use of epsom salt water  Has not follow up with chiropractor, MD    .  [facility]) Monitor and Manage My Blood Sugar (pt-stated)   On track     Follow Up Date 08/23/20   - check  blood sugar at prescribed times - check blood sugar if I feel it is too high or too low - take the blood sugar meter to all doctor visits     Notes: 07/26/20 She is attempting to find food resources in order to have food to take with administration of insulin per recommendation of her new pcp  06/30/20 She denies any medical changes  No worsening symptoms and "no real changes" hga1c remains 10  03/31/20 continues to monitor cbgs, reports wt loss of 6 lbs & reduction of hgA1c by 0.4 (10.4 to 10)        Alexis Oleksy L. Lavina Hamman, RN, BSN, [place] Coordinator Office number [phone] Main [facility] number [phone] Fax number [phone]

## 2020-07-26 NOTE — Patient Outreach (Incomplete)
[place] [facility]) Care Management  07/26/2020  Alexis Farmer 1941-04-30 [id number]  [facility] outreach to complex care patient Mrs Alexis Farmer was referred to [facility] on 11/17/19  Referral source[employer] and[employer] leadership for uncontrolled Diabetes A1C of 9.9, SE for possible housing and food resources.  Insurance Medicare, [employer]  Admissions/ED visits None in the last 6 months ED visit on 05/06/20 for otalgia (right ear pain- ear cleaned out and ordered flonase) and a visit by EMS with an ED visit for increase heart rate EMS completed and EKG indicating normal sinus rhythm She concluded [facility] SW services (transportation) on 05/31/20   Follow up Likes new primary care provider (PCP) Dr Caren Macadam Seen on 07/25/20 She called Tanna Furry to get assist with food and meal resources She called [employer] but ws informed they are only able to assist for immediate post hospitalizatoins  Other meal on wheel program will not be able to assist her for 6 months  Her MD want her to eat more with her insulin intake   Still having pain "all over my whole body"  Plans Patient agrees to care plan and follow up within the next 30-45 business days Pt encouraged to return a call to [facility] RN CM prn  [place] L. Lavina Hamman, RN, BSN, [place] Coordinator Office number [phone] Main [facility] number [phone] Fax number [phone]

## 2020-08-01 ENCOUNTER — Other Ambulatory Visit: Payer: Self-pay | Admitting: *Deleted

## 2020-08-01 DIAGNOSIS — I5032 Chronic diastolic (congestive) heart failure: Secondary | ICD-10-CM

## 2020-08-01 DIAGNOSIS — E1165 Type 2 diabetes mellitus with hyperglycemia: Secondary | ICD-10-CM

## 2020-08-01 DIAGNOSIS — N184 Chronic kidney disease, stage 4 (severe): Secondary | ICD-10-CM

## 2020-08-01 NOTE — Patient Outreach (Signed)
[place] [facility]) Care Management  08/01/2020  Alexis Farmer 31-May-1941 [id number]   [place] coordination- Referral for food insecurity  [facility] CMA sent message about fax for food insecurity for patient (media tab EPIC) from PCP  Patient Active Problem List   Diagnosis Date Noted  . 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 Retinopathy 05/30/2020  . Hypertension associated with diabetes ([place]) 03/25/2020  . Multinodular goiter 02/04/2020  . Peripheral edema 12/22/2019  . Proteinuria 12/22/2019  . Constipation 11/17/2019  . Recurrent falls 08/13/2019  . Physical deconditioning 08/13/2019  . Abdominal aortic atherosclerosis ([place]) 07/24/2019  . Morbid obesity with BMI of 45.0-49.9, adult ([place]) 06/09/2019  . Hypercalcemia 03/18/2019  . Round hole, unspecified eye 02/06/2019  . 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 Disorder of rotator cuff 06/24/2018  . Abnormal gait 03/26/2018  . Fall 03/26/2018  . Arthritis 03/26/2018  . Thyroid nodule 01/20/2018  . Dysuria 03/22/2017  . Cervical spondylosis with radiculopathy 01/26/2017  . Rectal hemorrhage 07/29/2016  . Carotid artery stenosis 07/19/2016  . PAD (peripheral artery disease) ([place]) 07/19/2016  . Swelling of lower leg 07/19/2016  . Bradycardia 07/17/2016  . Postmenopausal bleeding 07/17/2016  . Chronic diastolic heart failure ([place]) 05/31/2016  . Cough 01/25/2016  . Chronic fatigue 11/15/2015  . Depressive disorder 10/13/2015  . Osteoarthritis 10/13/2015  . Vertigo 10/13/2015  . Anxiety 09/13/2015  . Gastrointestinal hemorrhage 08/03/2015  . Mixed hyperlipidemia 08/26/2014  . Essential hypertension 04/08/2014  . Diabetes mellitus type II, uncontrolled ([place]) 04/08/2014  . Chronic kidney disease, stage 3 unspecified ([place]) 04/08/2014  . Edema of foot 04/08/2014  . Morbid obesity ([place]) 04/08/2014  . Low back pain 04/08/2014  .  Diabetic polyneuropathy ([place]) 04/08/2014  . Type 2 diabetes mellitus with other diabetic kidney complication ([place]) [id number]    Plan Referred to [facility] care guide follow upwithin the next 30-45 business days   Alon[place] Gazzola L. Lavina Hamman, RN, BSN, [place] Coordinator Office number [phone] Mobile number [phone]  Main [facility] number [phone] Fax number [phone]

## 2020-08-02 ENCOUNTER — Encounter (HOSPITAL_COMMUNITY): Payer: Medicare Other

## 2020-08-02 ENCOUNTER — Encounter: Payer: Medicare Other | Admitting: Vascular Surgery

## 2020-08-02 LAB — HM MAMMOGRAPHY

## 2020-08-05 ENCOUNTER — Telehealth: Payer: Self-pay

## 2020-08-05 NOTE — Telephone Encounter (Signed)
   Telephone encounter was:  Successful.  08/05/2020 Name: Alexis Farmer MRN: [id number] DOB: [birth year]  Alexis Farmer is a 80 y.o. year old female who is a primary care patient of Hagler, Apolonio Schneiders, MD . The community resource team was consulted for assistance with [place] guide performed the following interventions: Patient provided with information about care guide support team and interviewed to confirm resource needs Obtained verbal consent to place patient referral to meals on wheels and one [employer] further grocery delivery program. Left messages on voicemail for both organizations..  Follow Up Plan:  Care guide will follow up with patient by phone over the next 7 days and left message on voicemail for [employer] at [employer] to confirm if patient is on waitlist before placing referral. Left message on voicemail for [employer] to confirm who referral needs to go to, recent personnel changes.  Kyandre Okray, AAS Paralegal, [place] . Embedded Care Coordination [facility]  Care Management  [address], [place] [address] ??[email or web address]  ?? [phone]   [email or web address]

## 2020-08-08 ENCOUNTER — Telehealth: Payer: Self-pay

## 2020-08-08 NOTE — Telephone Encounter (Signed)
   Telephone encounter was:  Successful.  08/08/2020 Name: Alexis Farmer MRN: [id number] DOB: 1940-12-22  Alexis Farmer is a 80 y.o. year old female who is a primary care patient of Hagler, Apolonio Schneiders, MD . The community resource team was consulted for assistance with [place] guide performed the following interventions: Patient provided with information about care guide support team and interviewed to confirm resource needs Investigation of community resources performed spoke with Chester Holstein at [employer] of [employer] on Wheels patient is in their database already sent updated referral per Ashlyn's request. Patient will be placed on delivery list this week..  Follow Up Plan:  Care guide will follow up with patient by phone over the next 5 days  Alexis Farmer, AAS Paralegal, [place] . Embedded Care Coordination [facility]  Care Management  [address], [place] [address] ??[email or web address]  ?? [phone]   [email or web address]

## 2020-08-10 ENCOUNTER — Other Ambulatory Visit: Payer: Self-pay

## 2020-08-10 ENCOUNTER — Encounter: Payer: Self-pay | Admitting: Podiatry

## 2020-08-10 ENCOUNTER — Ambulatory Visit (INDEPENDENT_AMBULATORY_CARE_PROVIDER_SITE_OTHER): Payer: Medicare Other | Admitting: Podiatry

## 2020-08-10 DIAGNOSIS — E7800 Pure hypercholesterolemia, unspecified: Secondary | ICD-10-CM

## 2020-08-10 DIAGNOSIS — M79675 Pain in left toe(s): Secondary | ICD-10-CM | POA: Diagnosis not present

## 2020-08-10 DIAGNOSIS — M79674 Pain in right toe(s): Secondary | ICD-10-CM | POA: Diagnosis not present

## 2020-08-10 DIAGNOSIS — E1142 Type 2 diabetes mellitus with diabetic polyneuropathy: Secondary | ICD-10-CM

## 2020-08-10 DIAGNOSIS — B351 Tinea unguium: Secondary | ICD-10-CM

## 2020-08-10 DIAGNOSIS — E890 Postprocedural hypothyroidism: Secondary | ICD-10-CM | POA: Insufficient documentation

## 2020-08-10 HISTORY — DX: Pure hypercholesterolemia, unspecified: E78.00

## 2020-08-12 ENCOUNTER — Telehealth: Payer: Self-pay

## 2020-08-12 NOTE — Telephone Encounter (Signed)
   Telephone encounter was:  Successful.  08/12/2020 Name: Uneeda Lesky MRN: [id number] DOB: 12-Feb-1941  Violeta Pawlicki is a 80 y.o. year old female who is a primary care patient of Hagler, Apolonio Schneiders, MD . The community resource team was consulted for assistance with [place] guide performed the following interventions: Follow up call placed to the patient to discuss status of referral Patient received [employer]als [employer] Wheels delivery but does not like the food and called to cancel.  Gave patient the number for the [employer] Home Delivery Program for the elderly.  .  Follow Up Plan:  Care guide will follow up with patient by phone over the next 5 days  [place] Shew, AAS Paralegal, [place] . Embedded Care Coordination [facility] Health  Care Management  [address], [place] [address] ??[email or web address]  ?? [phone]   [email or web address]

## 2020-08-13 ENCOUNTER — Emergency Department (HOSPITAL_COMMUNITY): Payer: Medicare Other

## 2020-08-13 ENCOUNTER — Emergency Department (HOSPITAL_COMMUNITY)
Admission: EM | Admit: 2020-08-13 | Discharge: 2020-08-13 | Disposition: A | Discharge status: Home / Self Care | Payer: Medicare Other | Attending: Emergency Medicine | Admitting: Emergency Medicine

## 2020-08-13 DIAGNOSIS — E119 Type 2 diabetes mellitus without complications: Secondary | ICD-10-CM | POA: Diagnosis not present

## 2020-08-13 DIAGNOSIS — Z79899 Other long term (current) drug therapy: Secondary | ICD-10-CM | POA: Diagnosis not present

## 2020-08-13 DIAGNOSIS — I5030 Unspecified diastolic (congestive) heart failure: Secondary | ICD-10-CM | POA: Insufficient documentation

## 2020-08-13 DIAGNOSIS — Z794 Long term (current) use of insulin: Secondary | ICD-10-CM | POA: Insufficient documentation

## 2020-08-13 DIAGNOSIS — I110 Hypertensive heart disease with heart failure: Secondary | ICD-10-CM | POA: Diagnosis not present

## 2020-08-13 DIAGNOSIS — R609 Edema, unspecified: Secondary | ICD-10-CM | POA: Insufficient documentation

## 2020-08-13 DIAGNOSIS — R002 Palpitations: Secondary | ICD-10-CM | POA: Diagnosis present

## 2020-08-13 LAB — BASIC METABOLIC PANEL
Anion gap: 11 (ref 5–15)
BUN: 20 mg/dL (ref 8–23)
CO2: 24 mmol/L (ref 22–32)
Calcium: 9.4 mg/dL (ref 8.9–10.3)
Chloride: 101 mmol/L (ref 98–111)
Creatinine, Ser: 1.17 mg/dL — ABNORMAL HIGH (ref 0.44–1.00)
GFR, Estimated: 47 mL/min — ABNORMAL LOW (ref >60)
Glucose, Bld: 291 mg/dL — ABNORMAL HIGH (ref 70–99)
Potassium: 4.1 mmol/L (ref 3.5–5.1)
Sodium: 136 mmol/L (ref 135–145)

## 2020-08-13 LAB — CBC WITH DIFFERENTIAL/PLATELET
Abs Immature Granulocytes: 0.02 10*3/uL (ref 0.00–0.07)
Basophils Absolute: 0.1 10*3/uL (ref 0.0–0.1)
Basophils Relative: 1 %
Eosinophils Absolute: 0.2 10*3/uL (ref 0.0–0.5)
Eosinophils Relative: 3 %
HCT: 43.5 % (ref 36.0–46.0)
Hemoglobin: 13.9 g/dL (ref 12.0–15.0)
Immature Granulocytes: 0 %
Lymphocytes Relative: 53 %
Lymphs Abs: 4 10*3/uL (ref 0.7–4.0)
MCH: 29.2 pg (ref 26.0–34.0)
MCHC: 32 g/dL (ref 30.0–36.0)
MCV: 91.4 fL (ref 80.0–100.0)
Monocytes Absolute: 0.7 10*3/uL (ref 0.1–1.0)
Monocytes Relative: 10 %
Neutro Abs: 2.5 10*3/uL (ref 1.7–7.7)
Neutrophils Relative %: 33 %
Platelets: 225 10*3/uL (ref 150–400)
RBC: 4.76 MIL/uL (ref 3.87–5.11)
RDW: 13.2 % (ref 11.5–15.5)
WBC: 7.6 10*3/uL (ref 4.0–10.5)
nRBC: 0 % (ref 0.0–0.2)

## 2020-08-13 LAB — TROPONIN I (HIGH SENSITIVITY)
Troponin I (High Sensitivity): 14 ng/L (ref <18)
Troponin I (High Sensitivity): 20 ng/L — ABNORMAL HIGH (ref <18)

## 2020-08-13 LAB — CBG MONITORING, ED: Glucose-Capillary: 331 mg/dL — ABNORMAL HIGH (ref 70–99)

## 2020-08-13 LAB — BRAIN NATRIURETIC PEPTIDE: B Natriuretic Peptide: 25.3 pg/mL (ref 0.0–100.0)

## 2020-08-13 NOTE — ED Provider Notes (Signed)
[facility] EMERGENCY DEPARTMENT Provider Note  CSN: [id number] Arrival date & time: 08/13/20 0712    History Chief Complaint  Patient presents with  . Palpitations    HPI  Alexis Farmer is a 80 y.o. female with history of DM and HTN and diastolic HF reports she went to bed around 11pm last night which is a bit earlier than usual for her. She reports her heart was pounding and racing when she went to bed, but she was not having any pain. She reports when she woke up at 6am to use the bathroom she was still having symptoms. She had a similar episode around 2 months ago, EMS came to the house and did an EKG which was neg and she was not transported to the ED that time. She saw her cardiologist on 1/22 for follow up and was noted to have LE edema. She had her torsemide increased, advised to avoid salt and use compression stockings. This morning she reports her symptoms have improved since EMS came to evaluate her. She reports she was having symptoms during their initial evaluation and during their EKG which is at bedside and reviewed, neg for tachydysrhythmia. She denies any SOB, CP or fever at the time of my eval.     Past Medical History:  Diagnosis Date  . Arthritis   . Chickenpox   . Diabetes mellitus without complication ([place])    one elevated reading/ no treatment  . Diverticulitis   . GI bleed   . High cholesterol   . History of blood transfusion   . Hypertension   . Renal insufficiency     Past Surgical History:  Procedure Laterality Date  . APPENDECTOMY    . CHOLECYSTECTOMY    . ECTOPIC PREGNANCY SURGERY    . EYE SURGERY     bilateral cataracts  . EYE SURGERY     02/11/2019 repair hole in right eye   . gallbladder     . HYSTEROSCOPY WITH D & C N/A 10/26/2016   Procedure: DILATATION AND CURETTAGE /HYSTEROSCOPY;  Surgeon: Benjaman Kindler, MD;  Location: [facility] ORS;  Service: Gynecology;  Laterality: N/A;  . HYSTEROSCOPY WITH D & C N/A 07/07/2018   Procedure:  DILATATION AND CURETTAGE /HYSTEROSCOPY;  Surgeon: Benjaman Kindler, MD;  Location: [facility] ORS;  Service: Gynecology;  Laterality: N/A;  . THYROIDECTOMY, PARTIAL      Family History  Problem Relation Age of Onset  . Diabetes Mother   . Hypertension Mother   . Stroke Mother   . Diabetes Other   . Healthy Father   . Diabetes Sister   . Heart disease Sister     Social History   Tobacco Use  . Smoking status: Never Smoker  . Smokeless tobacco: Never Used  Vaping Use  . Vaping Use: Never used  Substance Use Topics  . Alcohol use: No  . Drug use: No     Home Medications Prior to Admission medications   Medication Sig Start Date End Date Taking? Authorizing Provider  acetaminophen (TYLENOL) 500 MG tablet Take 500 mg by mouth every 6 (six) hours as needed.     [provider]  amLODipine (NORVASC) 10 MG tablet Take 1 tablet (10 mg total) by mouth daily. 03/24/20   McLean-Scocuzza, Nino Glow, MD  Ascorbic Acid (VITAMIN C PO) Take 500 mg by mouth daily.    [provider]  blood glucose meter kit and supplies KIT Accu chek, Dx code E11.65, check 3 times daily 07/26/17  Leone Haven, MD  cholecalciferol (VITAMIN D3) 25 MCG (1000 UNIT) tablet Take 1,000 Units by mouth daily.    [provider]  cloNIDine (CATAPRES) 0.2 MG tablet Take 1 tablet (0.2 mg total) by mouth 3 (three) times daily. 06/01/20   McLean-Scocuzza, Nino Glow, MD  diclofenac Sodium (VOLTAREN) 1 % GEL Apply 2 g topically 4 (four) times daily. Upper body and 4 gram qid prn lower body arthritis pain/musculoskeletal pain this is also OTC 02/22/20   McLean-Scocuzza, Nino Glow, MD  fluticasone (FLONASE) 50 MCG/ACT nasal spray Place 2 sprays into both nostrils daily for 7 days. 05/06/20 05/13/20  Loura Halt A, NP  glucose 4 GM chewable tablet Chew 1 tablet (4 g total) by mouth as needed for low blood sugar. <70 11/10/19   McLean-Scocuzza, Nino Glow, MD  glucose blood test strip Use as instructed tid  Accuchek  Guide 06/03/20   McLean-Scocuzza, Nino Glow, MD  insulin NPH Human (NOVOLIN N) 100 UNIT/ML injection INJECT 20 UNITS IN THE MORNING AND 20 UNITS IN THE EVENING WITH A MEAL (pt actually taking 20 qam and 15 units qpm) 01/27/20   McLean-Scocuzza, Nino Glow, MD  insulin regular (NOVOLIN R) 100 units/mL injection 15 minutes before meals tid. 131-180 2 units 181 to 240 4 units 241-300 6units 301-350 8 units 351-400 10 units>400 12 units.  Keep glucose tablets for sugar < 70 06/03/20   McLean-Scocuzza, Nino Glow, MD  Insulin Syringe-Needle U-100 (B-D INS SYR ULTRAFINE .5CC/30G) 30G X 1/2" 0.5 ML MISC 1 Device by Does not apply route daily. Up to 5x per day with insulin bid (NPH) and tid (Regular) 08/13/19   McLean-Scocuzza, Nino Glow, MD  meclizine (ANTIVERT) 12.5 MG tablet Take 1 tablet (12.5 mg total) by mouth daily as needed for dizziness. 09/02/19   McLean-Scocuzza, Nino Glow, MD  Multiple Vitamin (MULTIVITAMIN WITH MINERALS) TABS tablet Take 1 tablet by mouth daily.    [provider]  Multiple Vitamins-Minerals (CENTRUM SILVER 50+WOMEN) TABS See admin instructions.    [provider]  mupirocin ointment (BACTROBAN) 2 % Apply 1 application topically 2 (two) times daily. Right index finger and left forearm 03/23/20   McLean-Scocuzza, Nino Glow, MD  polyethylene glycol powder (GLYCOLAX/MIRALAX) 17 GM/SCOOP powder Take 17 g by mouth daily as needed for moderate constipation or severe constipation. Can take up to 2x per day prn. Mix with 8 ounces of liquid 11/10/19   McLean-Scocuzza, Nino Glow, MD  rosuvastatin (CRESTOR) 20 MG tablet TAKE 1 TABLET (20 MG TOTAL) BY MOUTH DAILY. 06/30/20   O'NealCassie Freer, MD  spironolactone (ALDACTONE) 25 MG tablet Take 1 tablet (25 mg total) by mouth daily. 01/22/20   McLean-Scocuzza, Nino Glow, MD  torsemide (DEMADEX) 20 MG tablet Take 1 tablet (20 mg total) by mouth daily. 06/29/20   O'Neal, Cassie Freer, MD     Allergies    Penicillins   Review of Systems   Review  of Systems A comprehensive review of systems was completed and negative except as noted in HPI.    Physical Exam BP (!) 144/63   Pulse 62   Temp 98.5 F (36.9 C) (Oral)   Resp 16   SpO2 93%   Physical Exam Vitals and nursing note reviewed.  Constitutional:      Appearance: Normal appearance.  HENT:     Head: Normocephalic and atraumatic.     Nose: Nose normal.     Mouth/Throat:     Mouth: Mucous membranes are moist.  Eyes:  Extraocular Movements: Extraocular movements intact.     Conjunctiva/sclera: Conjunctivae normal.  Cardiovascular:     Rate and Rhythm: Normal rate.  Pulmonary:     Effort: Pulmonary effort is normal.     Breath sounds: Normal breath sounds.  Abdominal:     General: Abdomen is flat.     Palpations: Abdomen is soft.     Tenderness: There is no abdominal tenderness.  Musculoskeletal:        General: Normal range of motion.     Cervical back: Neck supple.     Right lower leg: Edema present.     Left lower leg: Edema present.  Skin:    General: Skin is warm and dry.  Neurological:     General: No focal deficit present.     Mental Status: She is alert.  Psychiatric:        Mood and Affect: Mood normal.      ED Results / Procedures / Treatments   Labs (all labs ordered are listed, but only abnormal results are displayed) Labs Reviewed  BASIC METABOLIC PANEL - Abnormal; Notable for the following components:      Result Value   Glucose, Bld 291 (*)    Creatinine, Ser 1.17 (*)    GFR, Estimated 47 (*)    All other components within normal limits  TROPONIN I (HIGH SENSITIVITY) - Abnormal; Notable for the following components:   Troponin I (High Sensitivity) 20 (*)    All other components within normal limits  CBC WITH DIFFERENTIAL/PLATELET  BRAIN NATRIURETIC PEPTIDE  TROPONIN I (HIGH SENSITIVITY)    EKG EKG Interpretation  Date/Time:  Saturday August 13 2020 07:35:09 EST Ventricular Rate:  85 PR Interval:    QRS Duration: 108 QT  Interval:  387 QTC Calculation: 461 R Axis:   -64 Text Interpretation: Sinus rhythm Prolonged PR interval Left anterior fascicular block Anterior infarct, old Nonspecific T abnormalities, lateral leads No significant change since last tracing Confirmed by Calvert Cantor [id number]) on 08/13/2020 7:42:52 AM   Radiology DG Chest Port 1 View  Result Date: 08/13/2020 CLINICAL DATA:  80 year old female with shortness of breath and palpitations. Lower extremity edema. EXAM: PORTABLE CHEST 1 VIEW COMPARISON:  Chest radiographs 06/22/2019 and earlier. FINDINGS: Portable AP semi upright view at 0731 hours. Cardiomegaly may be mildly increased since last year. Other mediastinal contours are within normal limits. Visualized tracheal air column is within normal limits. Upper limits of normal lung volumes. Allowing for portable technique the lungs are clear. No pneumothorax. No acute osseous abnormality identified. IMPRESSION: Cardiomegaly may be mildly increased since last year. No acute edema or other acute pulmonary abnormality. Electronically Signed   By: Genevie Ann M.D.   On: 08/13/2020 07:48    Procedures Procedures  Medications Ordered in the ED Medications - No data to display   MDM Rules/Calculators/A&P MDM Patient with palpitations, leg swelling. Currently asymptomatic. EKG unremarkable. Will check labs and monitor for any signs of dysrhythmia.  ED Course  I have reviewed the triage vital signs and the nursing notes.  Pertinent labs & imaging results that were available during my care of the patient were reviewed by me and considered in my medical decision making (see chart for details).  Clinical Course as of 08/13/20 1216  Sat Aug 13, 2020  0744 CBC is normal.  [CS]  [id number] Trop is neg, BMP with mild hyperglycemia, otherwise normal.  [CS]  3716 CXR with cardiomegaly, no acute edema.  [CS]  1005 BNP is  not significantly elevated [CS]  1143 Slight increase in Trop from first to second of  unclear significance as patient has not had chest pain or ischemic changes on EKG.  [CS]  2010 Patient remains asymptomatic. No clear etiology for her symptoms but does not appear to be any acute cardiac rhythm problem. Recommend she continue her medications and follow up with Cardiology as an outpatient. RTED for any other concerns.  [CS]    Clinical Course User Index [CS] Truddie Hidden, MD    Final Clinical Impression(s) / ED Diagnoses Final diagnoses:  Palpitations    Rx / DC Orders ED Discharge Orders    None       Truddie Hidden, MD 08/13/20 1216

## 2020-08-13 NOTE — ED Triage Notes (Signed)
Patient BIB [facility] from home stating she woke up with heart palpitations without any chest pains. Patient has a history of CHF.  Patient stated she had the same episode about 2 months ago. Lungs auscultated and clear bilaterally. +4 edema noted to lower extremities. Patient has a history of DM- 241 EMS VS-140/82, 95, 98% RA

## 2020-08-14 NOTE — Progress Notes (Signed)
Subjective: Alexis Farmer presents today for follow up of at risk foot care. Pt has h/o NIDDM with chronic kidney disease and painful mycotic nails b/l that are difficult to trim. Pain interferes with ambulation. Aggravating factors include wearing enclosed shoe gear. Pain is relieved with periodic professional debridement.   She voices no new pedal concerns on today's visit.  She states she has a new PCP: Dr. Caren Macadam. Last visit was 07/25/2020.  Allergies  Allergen Reactions  . Penicillins Rash    Has patient had a PCN reaction causing immediate rash, facial/tongue/throat swelling, SOB or lightheadedness with hypotension: Yes Has patient had a PCN reaction causing severe rash involving mucus membranes or skin necrosis: No Has patient had a PCN reaction that required hospitalization No Has patient had a PCN reaction occurring within the last 10 years: Yes If all of the above answers are "NO", then may proceed with Cephalosporin use.      Objective: There were no vitals filed for this visit.  Pt is a pleasant 80 y.o. year old [place] female, morbidly obese, in NAD. AAO x 3.   Vascular Examination:  Capillary refill time to digits immediate b/l. Palpable DP pulses b/l. Nonpalpable PT pulses b/l. Pedal hair absent b/l Skin temperature gradient within normal limits b/l.  Dermatological Examination: Pedal skin with normal turgor, texture and tone bilaterally. No open wounds bilaterally. No interdigital macerations bilaterally. Toenails 1-5 b/l elongated, dystrophic, thickened, crumbly with subungual debris and tenderness to dorsal palpation.  Musculoskeletal: Normal muscle strength 5/5 to all lower extremity muscle groups bilaterally. No pain crepitus or joint limitation noted with ROM b/l. Pes planus deformity noted b/l.  Utilizes walker for ambulation assistance.  Neurological: Protective sensation diminished with 10g monofilament b/l. Vibratory sensation intact b/l. Proprioception  intact bilaterally. Babinski reflex negative b/l. Clonus negative b/l.  Assessment: 1. Pain due to onychomycosis of toenails of both feet   2. Diabetic peripheral neuropathy associated with type 2 diabetes mellitus ([place])    Plan: -Examined patient. -No new findings. No new orders. -Continue diabetic foot care principles. -Toenails 1-5 b/l were debrided in length and girth with sterile nail nippers and dremel without iatrogenic bleeding.  -Patient to report any pedal injuries to medical professional immediately. -Patient/POA to call should there be question/concern in the interim.  Return in about 3 months (around 11/07/2020).  [name], DPM

## 2020-08-15 ENCOUNTER — Telehealth: Payer: Self-pay

## 2020-08-15 ENCOUNTER — Telehealth: Payer: Self-pay | Admitting: Cardiovascular Disease

## 2020-08-15 NOTE — Telephone Encounter (Signed)
Patient said she had another period of palpitations on Saturday and she went to the ER. The ER told her that anxiety could cause the palpitations.  The patient wanted to know if Dr. Audie Box could order her some medication for her anxiety. Please advise

## 2020-08-15 NOTE — Telephone Encounter (Signed)
   Telephone encounter was:  Successful.  08/15/2020 Name: Bethia Hauswirth MRN: [id number] DOB: January 29, 1941  Guiliana Cornacchia is a 80 y.o. year old female who is a primary care patient of Hagler, Apolonio Schneiders, MD . The community resource team was consulted for assistance with [place] guide performed the following interventions: Patient is not interested in the [employer] Program from their pantry or Meals on Wheels Program.  Follow Up Plan:  No further follow up planned at this time. The patient has been provided with needed resources.  Gabreil [place], AAS Paralegal, [place] . Embedded Care Coordination [facility]  Care Management  [address], [place] [address] ??[email or web address]  ?? [phone]   [email or web address]

## 2020-08-15 NOTE — Telephone Encounter (Signed)
Spoke with pt, she worries a lot and on Saturday she got upset and her heart felt like it was beating out of her chest. She went to the ER and they told her it was anxiety related. Aware she will need to discuss any anxiety medications from her medical doctor.

## 2020-08-17 NOTE — Progress Notes (Deleted)
MRN : [id number]  Alexis Farmer is a 80 y.o. (05-30-1941) female who presents with chief complaint of No chief complaint on file. Marland Kitchen  History of Present Illness:   The patient returns to the office for followup and review of the noninvasive studies. There have been no interval changes in lower extremity symptoms. No interval shortening of the patient's claudication distance or development of rest pain symptoms. No new ulcers or wounds have occurred since the last visit.  There have been no significant changes to the patient's overall health care.  The patient denies amaurosis fugax or recent TIA symptoms. There are no recent neurological changes noted. The patient denies history of DVT, PE or superficial thrombophlebitis. The patient denies recent episodes of angina or shortness of breath.   ABI Rt=[place] and Lt=[place]  (previous ABI's Rt=[place] and Lt=[place] ) Duplex ultrasound of the bilateral tibial arteries reveals biphasic waveforms with good toe waveforms.  No outpatient medications have been marked as taking for the 08/18/20 encounter (Appointment) with Delana Meyer, Dolores Lory, MD.    Past Medical History:  Diagnosis Date  . Arthritis   . Chickenpox   . Diabetes mellitus without complication ([place])    one elevated reading/ no treatment  . Diverticulitis   . GI bleed   . High cholesterol   . History of blood transfusion   . Hypertension   . Renal insufficiency     Past Surgical History:  Procedure Laterality Date  . APPENDECTOMY    . CHOLECYSTECTOMY    . ECTOPIC PREGNANCY SURGERY    . EYE SURGERY     bilateral cataracts  . EYE SURGERY     02/11/2019 repair hole in right eye   . gallbladder     . HYSTEROSCOPY WITH D & C N/A 10/26/2016   Procedure: DILATATION AND CURETTAGE /HYSTEROSCOPY;  Surgeon: Benjaman Kindler, MD;  Location: [facility] ORS;  Service: Gynecology;  Laterality: N/A;  . HYSTEROSCOPY WITH D & C N/A 07/07/2018   Procedure: DILATATION AND CURETTAGE /HYSTEROSCOPY;  Surgeon:  Benjaman Kindler, MD;  Location: [facility] ORS;  Service: Gynecology;  Laterality: N/A;  . THYROIDECTOMY, PARTIAL      Social History Social History   Tobacco Use  . Smoking status: Never Smoker  . Smokeless tobacco: Never Used  Vaping Use  . Vaping Use: Never used  Substance Use Topics  . Alcohol use: No  . Drug use: No    Family History Family History  Problem Relation Age of Onset  . Diabetes Mother   . Hypertension Mother   . Stroke Mother   . Diabetes Other   . Healthy Father   . Diabetes Sister   . Heart disease Sister     Allergies  Allergen Reactions  . Penicillins Rash    Has patient had a PCN reaction causing immediate rash, facial/tongue/throat swelling, SOB or lightheadedness with hypotension: Yes Has patient had a PCN reaction causing severe rash involving mucus membranes or skin necrosis: No Has patient had a PCN reaction that required hospitalization No Has patient had a PCN reaction occurring within the last 10 years: Yes If all of the above answers are "NO", then may proceed with Cephalosporin use.      REVIEW OF SYSTEMS (Negative unless checked)  Constitutional: '[]'$ Weight loss  '[]'$ Fever  '[]'$ Chills Cardiac: '[]'$ Chest pain   '[]'$ Chest pressure   '[]'$ Palpitations   '[]'$ Shortness of breath when laying flat   '[]'$ Shortness of breath with exertion. Vascular:  '[]'$ Pain in legs with walking   '[]'$   Pain in legs at rest  '[]'$ History of DVT   '[]'$ Phlebitis   '[]'$ Swelling in legs   '[]'$ Varicose veins   '[]'$ Non-healing ulcers Pulmonary:   '[]'$ Uses home oxygen   '[]'$ Productive cough   '[]'$ Hemoptysis   '[]'$ Wheeze  '[]'$ COPD   '[]'$ Asthma Neurologic:  '[]'$ Dizziness   '[]'$ Seizures   '[]'$ History of stroke   '[]'$ History of TIA  '[]'$ Aphasia   '[]'$ Vissual changes   '[]'$ Weakness or numbness in arm   '[]'$ Weakness or numbness in leg Musculoskeletal:   '[]'$ Joint swelling   '[]'$ Joint pain   '[]'$ Low back pain Hematologic:  '[]'$ Easy bruising  '[]'$ Easy bleeding   '[]'$ Hypercoagulable state   '[]'$ Anemic Gastrointestinal:  '[]'$ Diarrhea   '[]'$ Vomiting   '[]'$ Gastroesophageal reflux/heartburn   '[]'$ Difficulty swallowing. Genitourinary:  '[]'$ Chronic kidney disease   '[]'$ Difficult urination  '[]'$ Frequent urination   '[]'$ Blood in urine Skin:  '[]'$ Rashes   '[]'$ Ulcers  Psychological:  '[]'$ History of anxiety   '[]'$  History of major depression.  Physical Examination  There were no vitals filed for this visit. There is no height or weight on file to calculate BMI. Gen: WD/WN, NAD Head: [place]/AT, No temporalis wasting.  Ear/Nose/Throat: Hearing grossly intact, nares w/o erythema or drainage Eyes: PER, EOMI, sclera nonicteric.  Neck: Supple, no large masses.   Pulmonary:  Good air movement, no audible wheezing bilaterally, no use of accessory muscles.  Cardiac: RRR, no JVD Vascular:  Vessel Right Left  Radial Palpable Palpable  PT Palpable Palpable  DP Palpable Palpable  Gastrointestinal: Non-distended. No guarding/no peritoneal signs.  Musculoskeletal: M/S 5/5 throughout.  No deformity or atrophy.  Neurologic: CN 2-12 intact. Symmetrical.  Speech is fluent. Motor exam as listed above. Psychiatric: Judgment intact, Mood & affect appropriate for pt's clinical situation. Dermatologic: No rashes or ulcers noted.  No changes consistent with cellulitis.   CBC Lab Results  Component Value Date   WBC 7.6 08/13/2020   HGB 13.9 08/13/2020   HCT 43.5 08/13/2020   MCV 91.4 08/13/2020   PLT 225 08/13/2020    BMET    Component Value Date/Time   NA 136 08/13/2020 0728   NA 138 02/19/2019 0000   K 4.1 08/13/2020 0728   CL 101 08/13/2020 0728   CO2 24 08/13/2020 0728   GLUCOSE 291 (H) 08/13/2020 0728   BUN 20 08/13/2020 0728   BUN 11 02/19/2019 0000   CREATININE 1.17 (H) 08/13/2020 0728   CREATININE 1.52 (H) 02/06/2019 1447   CALCIUM 9.4 08/13/2020 0728   GFRNONAA 47 (L) 08/13/2020 0728   GFRAA 56 (L) 10/24/2019 1524   CrCl cannot be calculated (Unknown ideal weight.).  COAG Lab Results  Component Value Date   INR 0.98 10/08/2016    Radiology DG Chest  Port 1 View  Result Date: 08/13/2020 CLINICAL DATA:  80 year old female with shortness of breath and palpitations. Lower extremity edema. EXAM: PORTABLE CHEST 1 VIEW COMPARISON:  Chest radiographs 06/22/2019 and earlier. FINDINGS: Portable AP semi upright view at 0731 hours. Cardiomegaly may be mildly increased since last year. Other mediastinal contours are within normal limits. Visualized tracheal air column is within normal limits. Upper limits of normal lung volumes. Allowing for portable technique the lungs are clear. No pneumothorax. No acute osseous abnormality identified. IMPRESSION: Cardiomegaly may be mildly increased since last year. No acute edema or other acute pulmonary abnormality. Electronically Signed   By: Genevie Ann M.D.   On: 08/13/2020 07:48     Assessment/Plan There are no diagnoses linked to this encounter.   Hortencia Pilar, MD  08/17/2020 9:12 PM

## 2020-08-18 ENCOUNTER — Encounter (INDEPENDENT_AMBULATORY_CARE_PROVIDER_SITE_OTHER): Payer: Medicare Other

## 2020-08-18 ENCOUNTER — Ambulatory Visit (INDEPENDENT_AMBULATORY_CARE_PROVIDER_SITE_OTHER): Payer: Medicare Other | Admitting: Vascular Surgery

## 2020-08-18 DIAGNOSIS — E1165 Type 2 diabetes mellitus with hyperglycemia: Secondary | ICD-10-CM

## 2020-08-18 DIAGNOSIS — I10 Essential (primary) hypertension: Secondary | ICD-10-CM

## 2020-08-18 DIAGNOSIS — I739 Peripheral vascular disease, unspecified: Secondary | ICD-10-CM

## 2020-08-18 DIAGNOSIS — I6523 Occlusion and stenosis of bilateral carotid arteries: Secondary | ICD-10-CM

## 2020-08-18 DIAGNOSIS — E782 Mixed hyperlipidemia: Secondary | ICD-10-CM

## 2020-08-23 ENCOUNTER — Other Ambulatory Visit: Payer: Self-pay

## 2020-08-23 ENCOUNTER — Telehealth: Payer: Self-pay | Admitting: Family Medicine

## 2020-08-23 ENCOUNTER — Other Ambulatory Visit: Payer: Self-pay | Admitting: *Deleted

## 2020-08-23 NOTE — Telephone Encounter (Signed)
Patient called in about coming back as a patient she was seen 05-06-20 are you going to see her

## 2020-08-23 NOTE — Telephone Encounter (Signed)
Yes please sch appt when needed

## 2020-08-23 NOTE — Patient Outreach (Addendum)
[facility]) Care Management  08/23/2020  Alexis Farmer [birth year] [id number]  [facility] outreach to complex care patient Alexis Farmer was referred to [facility] on 11/17/19  Referral source[employer]igna andTHN leadership for uncontrolled Diabetes A1C of 9.9, SE for possible housing and food resources.  Insurance Medicare, Cigna  Admissions/ED visits None in the last 6 months ED visit on 05/06/20 for otalgia (right ear pain- ear cleaned out and ordered flonase) and a visit by EMS with an ED visit for increase heart rate EMS completed and EKG indicating normal sinus rhythm She concluded [facility] SW services (transportation) on 05/31/20   Follow up Will be changing primary care provider (PCP) services from Dr Caren Macadam (Seen on 07/25/20) to Dr Marigene Ehlers "I'm back were I wanted to be anyway" She reports being informed that her transportation services will not transport her to Dr Mannie Stabile as she is and [place] provider and "not a cone" provider    She received calls from [facility] care guide to get assist with food and meal resources She reports preference not to used provided resources as she did not receive enough food nor the quality of food she preferred Delivery food "turned down cause it was unsanitary to me and it wasn't good", "I wasn't eating it"    Saturday palpitations of heart walk fast stress   Pt admits to being lonely, depressed, "worried about dying, God's work. Cause I cant; do anything about it" and experiencing 2 panic episodes with one leading to her recent ED visit resulting in ED provider dx of palpations. She confirms reading through her ED visit summary on the home care   PACE discussed by [facility] RN CM of a possible resources for her Pt states she "don't want to be around other people" except her family  She reports waiting for her grand daughter to get a single level home with better heating so she may possibly go to stay with her.  She reports her grand daughter was  visiting but recently obtained a job that has decreased the visits. She confirms her family calls "but that is not the same.: She reports her daughter has illnesses of her own she is managing.     Has not friends and states she is not open to meeting one friend confirmed with assessment  [facility] RN CM discussed [facility] SW for possible counseling services Discussed the various routes of receiving services (in person, via telephone or virtual) She reports prefer to see someone in person only  She refused [facility] SW referral. She states she will speak with Dr Marigene Ehlers about it  Depression screen [facility] 2/9 08/23/2020 05/05/2020 05/04/2020  Decreased Interest '1 1 1  '$ Down, Depressed, Hopeless '2 2 2  '$ PHQ - 2 Score '3 3 3  '$ Altered sleeping '2 1 1  '$ Tired, decreased energy '1 1 1  '$ Change in appetite '1 1 1  '$ Feeling bad or failure about yourself  0 0 1  Trouble concentrating 0 0 0  Moving slowly or fidgety/restless 0 0 0  Suicidal thoughts 0 0 0  PHQ-9 Score '7 6 7  '$ Difficult doing work/chores Somewhat difficult Somewhat difficult Somewhat difficult  Some recent data might be hidden    Plans Patient agrees to care plan, [facility] sending note to Dr Marigene Ehlers and follow upwithin the next 30-45 business days   Cristina Mattern L. Lavina Hamman, RN, BSN, [place] Coordinator Office number [phone] Main [facility] number [phone] Fax number [phone]

## 2020-08-24 ENCOUNTER — Other Ambulatory Visit: Payer: Self-pay | Admitting: *Deleted

## 2020-08-24 NOTE — Patient Outreach (Addendum)
[facility]) Care Management  08/24/2020  Alexis Farmer 24-Oct-1940 [id number]   [facility] outreach to complex care patient with case closure Alexis Farmer was referred to [facility] on 11/17/19  Referral sourceCigna andTHN leadership for uncontrolled Diabetes A1C of 9.9, SE for possible housing and food resources.  Insurance Medicare, Cigna  Admissions/ED visits None in the last 6 months ED visit on 05/06/20 for otalgia (right ear pain- ear cleaned out and ordered flonase) and a visit by EMS with an ED visit for increase heart rate EMS completed and EKG indicating normal sinus rhythm She concluded [facility] SW services (transportation) on 05/31/20   Follow up Patient is able to verify HIPAA ([place]) identifiers Reviewed and addressed the purpose of the follow up call with the patient  Consent: [facility] RN CM reviewed [facility] services with patient. Patient gave verbal consent for services.  With review of patient attempted goal progression, [facility] RN CM outreached to Alexis Farmer to discuss the continuation of [facility] services Alexis Farmer and [facility] RN CM agrees to [facility] case closure Alexis Farmer continues to not progress with goals even with being offered various resources, [facility] office visits with her, offers for care coordination and disease management With 08/23/20 outreach she did not prefer any care coordination and disease management assistance. Alexis Farmer agrees she will consult Dr Marigene Ehlers related to her recent palpitations related to stress for her options vs [facility] SW referral Alexis Farmer prefers to be able to outreach to [facility] RN CM prn vs being scheduled for future outreaches  Plan  Case closure plus patient pcp office is to receive [facility] embedded services  E mail to [facility] embedded team members Letters to pt and MDs  Goals Addressed              This Visit's Progress     Patient Stated   .  COMPLETED: [facility])  Manage Pain (pt-stated)   Not on track     Follow Up Date  - develop a personal pain management plan - prioritize tasks for the day - use ice or heat for pain relief - work slower and less intense when having pain      Notes: 08/24/20 continues to report pain but "there is nothing no one can do about it" 07/26/20 she hs established care with her new pcp, seen on 07/25/20 she discussed her pain with pcp 06/30/20 She denies any medical changes  No worsening symptoms and "no real changes" 03/31/20 personal pain plan - use of epsom salt water  Has not follow up with chiropractor, MD    .  COMPLETED: ([place]) Monitor and Manage My Blood Sugar (pt-stated)        Follow Up Date 08/23/20   - check blood sugar at prescribed times - check blood sugar if I feel it is too high or too low - take the blood sugar meter to all doctor visits      Notes: 08/24/20 monitoring but fair improvement "about the same" Changed pcps  Care guide assistance received for food insecurity but she does not want further food as she states she does not like the food  07/26/20 She is attempting to find food resources in order to have food to take with administration of insulin per recommendation of her new pcp  06/30/20 She denies any medical changes  No worsening symptoms and "no real changes" hga1c remains 10  03/31/20 continues to monitor cbgs, reports wt loss  of 6 lbs & reduction of hgA1c by 0.4 (10.4 to 10)        Decklyn Hornik L. Lavina Hamman, RN, BSN, [place] Coordinator Office number [phone] Main [facility] number [phone] Fax number [phone]

## 2020-08-25 ENCOUNTER — Encounter: Payer: Self-pay | Admitting: Internal Medicine

## 2020-08-25 ENCOUNTER — Other Ambulatory Visit: Payer: Self-pay

## 2020-08-25 ENCOUNTER — Telehealth: Payer: Self-pay | Admitting: Internal Medicine

## 2020-08-25 ENCOUNTER — Ambulatory Visit (INDEPENDENT_AMBULATORY_CARE_PROVIDER_SITE_OTHER): Payer: Medicare Other | Admitting: Internal Medicine

## 2020-08-25 VITALS — BP 162/76 | HR 86 | Temp 98.3°F | Ht 63.0 in | Wt 259.2 lb

## 2020-08-25 DIAGNOSIS — F339 Major depressive disorder, recurrent, unspecified: Secondary | ICD-10-CM

## 2020-08-25 DIAGNOSIS — G8929 Other chronic pain: Secondary | ICD-10-CM

## 2020-08-25 DIAGNOSIS — R002 Palpitations: Secondary | ICD-10-CM

## 2020-08-25 DIAGNOSIS — E1165 Type 2 diabetes mellitus with hyperglycemia: Secondary | ICD-10-CM

## 2020-08-25 DIAGNOSIS — E1142 Type 2 diabetes mellitus with diabetic polyneuropathy: Secondary | ICD-10-CM

## 2020-08-25 DIAGNOSIS — F419 Anxiety disorder, unspecified: Secondary | ICD-10-CM | POA: Diagnosis not present

## 2020-08-25 DIAGNOSIS — E1159 Type 2 diabetes mellitus with other circulatory complications: Secondary | ICD-10-CM | POA: Insufficient documentation

## 2020-08-25 DIAGNOSIS — I152 Hypertension secondary to endocrine disorders: Secondary | ICD-10-CM

## 2020-08-25 DIAGNOSIS — G894 Chronic pain syndrome: Secondary | ICD-10-CM | POA: Insufficient documentation

## 2020-08-25 DIAGNOSIS — I10 Essential (primary) hypertension: Secondary | ICD-10-CM | POA: Diagnosis not present

## 2020-08-25 DIAGNOSIS — E118 Type 2 diabetes mellitus with unspecified complications: Secondary | ICD-10-CM

## 2020-08-25 LAB — POCT GLYCOSYLATED HEMOGLOBIN (HGB A1C): Hemoglobin A1C: 9.3 % — AB (ref 4.0–5.6)

## 2020-08-25 MED ORDER — TRAMADOL HCL 50 MG PO TABS: 50.0000 mg | ORAL_TABLET | Freq: Two times a day (BID) | ORAL | 0 refills | Status: AC | PRN

## 2020-08-25 MED ORDER — METOPROLOL SUCCINATE ER 25 MG PO TB24: 25.0000 mg | ORAL_TABLET | Freq: Every day | ORAL | 3 refills | Status: DC

## 2020-08-25 MED ORDER — CITALOPRAM HYDROBROMIDE 10 MG PO TABS: 10.0000 mg | ORAL_TABLET | Freq: Every day | ORAL | 3 refills | Status: DC

## 2020-08-25 NOTE — Progress Notes (Signed)
Chief Complaint  Patient presents with  . Follow-up    HFU; heart palpitations, depression, anxiety, not sleeping well. Lives alone, she thinks that triggers the anxiety.   F/u  1. 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 C/o heart palpitations with h/o uncontrolled HTN and chronic diastolic CHF. She does notice when she is anxious/depressed on the weekends or not sleeping well she has episodes of racing heart 3. HTN on clonidine 0.3 tid, norvasc 10 mg qd, spironolactone 25 mg qd and torsemide 20 mg qd and BP still high she reports compliance with meds  -in the past was on lopressor not sure why this was stopped will [facility] renal, Dr. Marisue Ivan (current cards) and Dr. Rockey Situ prior cards  Will f/u cards 09/30/20  4. Uncontrolled diabetes appt Dr. Loanne Drilling 09/14/20  Review of Systems  Constitutional: Negative for weight loss.  HENT: Negative for hearing loss.   Eyes: Negative for blurred vision.  Respiratory: Negative for shortness of breath.   Cardiovascular: Positive for palpitations. Negative for chest pain.  Gastrointestinal: Negative for abdominal pain.  Musculoskeletal: Positive for back pain and joint pain.  Skin: Negative for rash.  Psychiatric/Behavioral: Positive for depression. The patient is nervous/anxious and has insomnia.    Past Medical History:  Diagnosis Date  . Arthritis   . Chickenpox   . Diabetes mellitus without complication ([place])    one elevated reading/ no treatment  . Diverticulitis   . GI bleed   . High cholesterol   . History of blood transfusion   . Hypertension   . Renal insufficiency    Past Surgical History:  Procedure Laterality Date  . APPENDECTOMY    . CHOLECYSTECTOMY    . ECTOPIC PREGNANCY SURGERY    . EYE SURGERY     bilateral cataracts  . EYE SURGERY     02/11/2019 repair hole in right eye   . gallbladder     . HYSTEROSCOPY WITH D & C N/A 10/26/2016   Procedure: DILATATION AND CURETTAGE /HYSTEROSCOPY;   Surgeon: Benjaman Kindler, MD;  Location: [facility] ORS;  Service: Gynecology;  Laterality: N/A;  . HYSTEROSCOPY WITH D & C N/A 07/07/2018   Procedure: DILATATION AND CURETTAGE /HYSTEROSCOPY;  Surgeon: Benjaman Kindler, MD;  Location: [facility] ORS;  Service: Gynecology;  Laterality: N/A;  . THYROIDECTOMY, PARTIAL     Family History  Problem Relation Age of Onset  . Diabetes Mother   . Hypertension Mother   . Stroke Mother   . Diabetes Other   . Healthy Father   . Diabetes Sister   . Heart disease Sister    Social History   Socioeconomic History  . Marital status: Widowed    Spouse name: Not on file  . Number of children: 1  . Years of education: 26  . Highest education level: 12th grade  Occupational History  . 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 Smoking status: Never Smoker  . Smokeless tobacco: Never Used  Vaping Use  . Vaping Use: Never used  Substance and Sexual Activity  . Alcohol use: No  . Drug use: No  . Sexual activity: Not Currently  Other Topics Concern  . 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 Difficulty of Paying Living Expenses: Not very hard  Food Insecurity: Food Insecurity Present  . Worried About Charity fundraiser in the Last Year: Sometimes true  . Ran Out of Food in the Last Year: Never true  Transportation Needs: No Transportation Needs  . Lack of Transportation (Medical): No  . Lack of Transportation (Non-Medical): No  Physical Activity: Inactive  . Days of Exercise per Week: 0 days  . Minutes of Exercise per Session: 0 min  Stress: Stress Concern Present  . Feeling of Stress : To some extent   Social Connections: Socially Isolated  . Frequency of Communication with Friends and Family: More than three times a week  . Frequency of Social Gatherings with Friends and Family: More than three times a week  . Attends Religious Services: Never  . Active Member of Clubs or Organizations: No  . Attends Archivist Meetings: Never  . Marital Status: Widowed  Intimate Partner Violence: Not At Risk  . Fear of Current or Ex-Partner: No  . Emotionally Abused: No  . Physically Abused: No  . Sexually Abused: No   Current Meds  Medication Sig  . acetaminophen (TYLENOL) 500 MG tablet Take 500 mg by mouth every 6 (six) hours as needed.   Marland Kitchen amLODipine (NORVASC) 10 MG tablet Take 1 tablet (10 mg total) by mouth daily.  . Ascorbic Acid (VITAMIN C PO) Take 500 mg by mouth daily.  . blood glucose meter kit and supplies KIT Accu chek, Dx code E11.65, check 3 times daily  . cholecalciferol (VITAMIN D3) 25 MCG (1000 UNIT) tablet Take 1,000 Units by mouth daily.  . citalopram (CELEXA) 10 MG tablet Take 1 tablet (10 mg total) by mouth daily.  . cloNIDine (CATAPRES) 0.2 MG tablet Take 1 tablet (0.2 mg total) by mouth 3 (three) times daily.  . diclofenac Sodium (VOLTAREN) 1 % GEL Apply 2 g topically 4 (four) times daily. Upper body and 4 gram qid prn lower body arthritis pain/musculoskeletal pain this is also OTC  . glucose 4 GM chewable tablet Chew 1 tablet (4 g total) by mouth as needed for low blood sugar. <70  . glucose blood test strip Use as instructed tid  Accuchek Guide  . insulin NPH Human (NOVOLIN N) 100 UNIT/ML injection INJECT 20 UNITS IN THE MORNING AND 20 UNITS IN THE EVENING WITH A MEAL (pt actually taking 20 qam and 15 units qpm)  . insulin regular (NOVOLIN R) 100 units/mL injection 15 minutes before meals tid. 131-180 2 units 181 to 240 4 units 241-300 6units 301-350 8 units 351-400 10 units>400 12 units.  Keep glucose tablets for sugar < 70  . Insulin Syringe-Needle U-100 (B-D INS  SYR ULTRAFINE .5CC/30G) 30G X 1/2" 0.5 ML MISC 1 Device by Does not apply route daily. Up to 5x per day with insulin bid (NPH) and tid (Regular)  . meclizine (ANTIVERT) 12.5 MG tablet Take 1 tablet (12.5 mg total) by mouth daily as needed for dizziness.  . metoprolol succinate (TOPROL XL) 25 MG 24 hr tablet Take 1 tablet (25 mg total) by mouth daily.  . Multiple Vitamin (MULTIVITAMIN WITH MINERALS) TABS tablet Take 1 tablet by mouth daily.  . Multiple Vitamins-Minerals (CENTRUM SILVER 50+WOMEN) TABS See admin instructions.  . mupirocin ointment (BACTROBAN) 2 % Apply 1 application topically 2 (two) times daily. Right  index finger and left forearm  . polyethylene glycol powder (GLYCOLAX/MIRALAX) 17 GM/SCOOP powder Take 17 g by mouth daily as needed for moderate constipation or severe constipation. Can take up to 2x per day prn. Mix with 8 ounces of liquid  . rosuvastatin (CRESTOR) 20 MG tablet TAKE 1 TABLET (20 MG TOTAL) BY MOUTH DAILY.  Marland Kitchen spironolactone (ALDACTONE) 25 MG tablet Take 1 tablet (25 mg total) by mouth daily.  Marland Kitchen torsemide (DEMADEX) 20 MG tablet Take 1 tablet (20 mg total) by mouth daily.  . traMADol (ULTRAM) 50 MG tablet Take 1 tablet (50 mg total) by mouth 2 (two) times daily as needed for up to 5 days.   Allergies  Allergen Reactions  . Jardiance [Empagliflozin]   . Penicillin V     Other reaction(s): rash  . Penicillins Rash    Has patient had a PCN reaction causing immediate rash, facial/tongue/throat swelling, SOB or lightheadedness with hypotension: Yes Has patient had a PCN reaction causing severe rash involving mucus membranes or skin necrosis: No Has patient had a PCN reaction that required hospitalization No Has patient had a PCN reaction occurring within the last 10 years: Yes If all of the above answers are "NO", then may proceed with Cephalosporin use.    Recent Results (from the past 2160 hour(s))  Basic metabolic panel     Status: Abnormal   Collection Time:  08/13/20  7:28 AM  Result Value Ref Range   Sodium 136 135 - 145 mmol/L   Potassium 4.1 3.5 - 5.1 mmol/L   Chloride 101 98 - 111 mmol/L   CO2 24 22 - 32 mmol/L   Glucose, Bld 291 (H) 70 - 99 mg/dL    Comment: Glucose reference range applies only to samples taken after fasting for at least 8 hours.   BUN 20 8 - 23 mg/dL   Creatinine, Ser 1.17 (H) 0.44 - 1.00 mg/dL   Calcium 9.4 8.9 - 10.3 mg/dL   GFR, Estimated 47 (L) >60 mL/min    Comment: (NOTE) Calculated using the CKD-EPI Creatinine Equation (2021)    Anion gap 11 5 - 15    Comment: Performed at [facility] [address], [place], [place] [address]  CBC with Differential     Status: None   Collection Time: 08/13/20  7:28 AM  Result Value Ref Range   WBC 7.6 4.0 - 10.5 K/uL   RBC 4.76 3.87 - 5.11 MIL/uL   Hemoglobin 13.9 12.0 - 15.0 g/dL   HCT 43.5 36.0 - 46.0 %   MCV 91.4 80.0 - 100.0 fL   MCH 29.2 26.0 - 34.0 pg   MCHC 32.0 30.0 - 36.0 g/dL   RDW 13.2 11.5 - 15.5 %   Platelets 225 150 - 400 K/uL   nRBC 0.0 0.0 - 0.2 %   Neutrophils Relative % 33 %   Neutro Abs 2.5 1.7 - 7.7 K/uL   Lymphocytes Relative 53 %   Lymphs Abs 4.0 0.7 - 4.0 K/uL   Monocytes Relative 10 %   Monocytes Absolute 0.7 0.1 - 1.0 K/uL   Eosinophils Relative 3 %   Eosinophils Absolute 0.2 0.0 - 0.5 K/uL   Basophils Relative 1 %   Basophils Absolute 0.1 0.0 - 0.1 K/uL   Immature Granulocytes 0 %   Abs Immature Granulocytes 0.02 0.00 - 0.07 K/uL    Comment: Performed at [facility], [address], [place], [place] [address]  Troponin I (High Sensitivity)  Status: None   Collection Time: 08/13/20  7:28 AM  Result Value Ref Range   Troponin I (High Sensitivity) 14 <18 ng/L    Comment: (NOTE) Elevated high sensitivity troponin I (hsTnI) values and significant  changes across serial measurements may suggest ACS but many other  chronic and acute conditions are known to elevate hsTnI results.  Refer to the "Links" section for chest  pain algorithms and additional  guidance. Performed at [facility], [place] [address] [place], [place] [address]   Brain natriuretic peptide     Status: None   Collection Time: 08/13/20  7:28 AM  Result Value Ref Range   B Natriuretic Peptide 25.3 0.0 - 100.0 pg/mL    Comment: Performed at [facility] [address] [place], [place] [address]  Troponin I (High Sensitivity)     Status: Abnormal   Collection Time: 08/13/20 10:37 AM  Result Value Ref Range   Troponin I (High Sensitivity) 20 (H) <18 ng/L    Comment: (NOTE) Elevated high sensitivity troponin I (hsTnI) values and significant  changes across serial measurements may suggest ACS but many other  chronic and acute conditions are known to elevate hsTnI results.  Refer to the "Links" section for chest pain algorithms and additional  guidance. Performed at [facility], [place] [address], [place], [place] [address]   CBG monitoring, ED     Status: Abnormal   Collection Time: 08/13/20  1:09 PM  Result Value Ref Range   Glucose-Capillary 331 (H) 70 - 99 mg/dL    Comment: Glucose reference range applies only to samples taken after fasting for at least 8 hours.   Comment 1 Notify RN    Comment 2 Document in Chart   POCT HgB A1C     Status: Abnormal   Collection Time: 08/25/20 10:58 AM  Result Value Ref Range   Hemoglobin A1C 9.3 (A) 4.0 - 5.6 %   HbA1c POC (<> result, manual entry)     HbA1c, POC (prediabetic range)     HbA1c, POC (controlled diabetic range)     Objective  Body mass index is 45.92 kg/m. Wt Readings from Last 3 Encounters:  08/25/20 259 lb 4 oz (117.6 kg)  06/29/20 263 lb 3.2 oz (119.4 kg)  03/23/20 251 lb 3.2 oz (113.9 kg)   Temp Readings from Last 3 Encounters:  08/25/20 98.3 F (36.8 C)  08/13/20 98.6 F (37 C) (Oral)  05/06/20 98.2 F (36.8 C) (Oral)   BP Readings from Last 3 Encounters:  08/25/20 (!) 162/76  08/13/20 100/63  06/29/20 (!) 162/86   Pulse Readings from Last  3 Encounters:  08/25/20 86  08/13/20 83  06/29/20 86    Physical Exam Vitals and nursing note reviewed.  Constitutional:      Appearance: Normal appearance. She is well-developed and well-groomed. She is morbidly obese.  HENT:     Head: Normocephalic and atraumatic.  Eyes:     Conjunctiva/sclera: Conjunctivae normal.     Pupils: Pupils are equal, round, and reactive to light.  Cardiovascular:     Rate and Rhythm: Normal rate and regular rhythm.     Heart sounds: Normal heart sounds. No murmur heard.   Pulmonary:     Effort: Pulmonary effort is normal.     Breath sounds: Normal breath sounds.  Skin:    General: Skin is warm and dry.  Neurological:     General: No focal deficit present.  Mental Status: She is alert and oriented to person, place, and time. Mental status is at baseline.     Gait: Gait abnormal.     Comments: Walks with rollator  Psychiatric:        Attention and Perception: Attention and perception normal.        Mood and Affect: Mood and affect normal.        Speech: Speech normal.        Behavior: Behavior normal. Behavior is cooperative.        Thought Content: Thought content normal.        Cognition and Memory: Cognition and memory normal.        Judgment: Judgment normal.     Assessment  Plan  Hypertension associated with diabetes ([place]) BP uncontrolled though had am meds clonidine 0.3 tid, norvasc 10 mg qd, spironolactone 25 mg qd and torsemide 20 mg qd  Consider add toprol xl 25 mg qd will will cc cards and renal to see if ok with this   Palpitations  cc'ed cards for zio could be related to anxiety F/u cards 09/30/20   Depression, recurrent ([place]) - Plan: citalopram (CELEXA) 10 MG tablet Anxiety - Plan: citalopram (CELEXA) 10 MG tablet rec call [facility] for therapy in [place]   Other chronic pain neck/hands back low likely due to arthritis - Plan: traMADol (ULTRAM) 50 MG tablet bid prn If works can sign pain contract   Uncontrolled type 2  diabetes mellitus with hyperglycemia ([place]) - Plan: POCT HgB A1C 9.3 uncontrolled  She has np appt 09/14/20 with Dr. Loanne Drilling  Cont current meds for now  HM Flu shotutd 3/3covid vxs had Check on other vaccines (I.e Tdap, prevnar, pna 23, shingrix)  -as of 08/25/20 declines prevnar   mammo had9/12/20normal [facility] Had 07/2020 [facility] need to get copy of report   Pap had 05/28/18 negative [facility] 07/07/18 endometrial bx neg and same 10/26/16 Of and off bleeding f/u ob/gyn and given progestin 08/11/19 did not pick up appt 04/2020 gyn to f/u  DEXA pt declines for now -no extra Ca h/o hyperCa  Colonoscopy Dr. 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 Olivia Mackie McLean-Scocuzza-Internal Medicine

## 2020-08-25 NOTE — Telephone Encounter (Signed)
Dr. Marisue Ivan is ok with starting Toprol xl 25 mg daily for more blood pressure control

## 2020-08-25 NOTE — Telephone Encounter (Signed)
Need copy of [facility]ogram had 07/2020

## 2020-08-25 NOTE — Telephone Encounter (Signed)
Pt called and made aware that cardiologist was okay with her starting Toprol xl '25mg'$ . I let patient know that it was sent to her pharmacy.

## 2020-08-25 NOTE — Patient Instructions (Addendum)
[facility] --call for therapy  [address]  [place] [address]  [phone] $99 per hours I believe is the price   Consider Metoprolol xl 25 mg daily for more blood control sent to pharmacy *  Consider celexa 10 mg for mood and anxiety daily   Tramadol 50 mg up to 2x per day for pain    Global hearing for true hearing aids   Consider prevnar vaccine at your next visit   Consider coppertone compression knee highs at [employer] based on shoe size    Arthritis Arthritis is a term that is commonly used to refer to joint pain or joint disease. There are more than 100 types of arthritis. What are the causes? The most common cause of this condition is wear and tear of a joint. Other causes include:  Gout.  Inflammation of a joint.  An infection of a joint.  Sprains and other injuries near the joint.  A reaction to medicines or drugs, or an allergic reaction. In some cases, the cause may not be known. What are the signs or symptoms? The main symptom of this condition is pain in the joint during movement. Other symptoms include:  Redness, swelling, or stiffness at a joint.  Warmth coming from the joint.  Fever.  Overall feeling of illness. How is this diagnosed? This condition may be diagnosed with a physical exam and tests, including:  Blood tests.  Urine tests.  Imaging tests, such as X-rays, an MRI, or a CT scan. Sometimes, fluid is removed from a joint for testing. How is this treated? This condition may be treated with:  Treatment of the cause, if it is known.  Rest.  Raising (elevating) the joint.  Applying cold or hot packs to the joint.  Medicines to improve symptoms and reduce inflammation.  Injections of a steroid such as cortisone into the joint to help reduce pain and inflammation. Depending on the cause of your arthritis, you may need to make lifestyle changes to reduce stress on your joint.  Changes may include:  Exercising more.  Losing weight. Follow these instructions at home: Medicines  Take over-the-counter and prescription medicines only as told by your health care provider.  Do not take aspirin to relieve pain if your health care provider thinks that gout may be causing your pain. Activity  Rest your joint if told by your health care provider. Rest is important when your disease is active and your joint feels painful, swollen, or stiff.  Avoid activities that make the pain worse. It is important to balance activity with rest.  Exercise your joint regularly with range-of-motion exercises as told by your health care provider. Try doing low-impact exercise, such as: ? Swimming. ? Water aerobics. ? Biking. ? Walking. Managing pain, stiffness, and swelling  If directed, put ice on the joint. ? Put ice in a plastic bag. ? Place a towel between your skin and the bag. ? Leave the ice on for 20 minutes, 2-3 times per day.  If your joint is swollen, raise (elevate) it above the level of your heart if directed by your health care provider.  If your joint feels stiff in the morning, try taking a warm shower.  If directed, apply heat to the affected area as often as told by your health care provider. Use the heat source that your health care provider recommends, such as a moist heat pack or a heating pad. If you have diabetes, do not  apply heat without permission from your health care provider. To apply heat: ? Place a towel between your skin and the heat source. ? Leave the heat on for 20-30 minutes. ? Remove the heat if your skin turns bright red. This is especially important if you are unable to feel pain, heat, or cold. You may have a greater risk of getting burned.      General instructions  Do not use any products that contain nicotine or tobacco, such as cigarettes, e-cigarettes, and chewing tobacco. If you need help quitting, ask your health care  provider.  Keep all follow-up visits as told by your health care provider. This is important. Contact a health care provider if:  The pain gets worse.  You have a fever. Get help right away if:  You develop severe joint pain, swelling, or redness.  Many joints become painful and swollen.  You develop severe back pain.  You develop severe weakness in your leg.  You cannot control your bladder or bowels. Summary  Arthritis is a term that is commonly used to refer to joint pain or joint disease. There are more than 100 types of arthritis.  The most common cause of this condition is wear and tear of a joint. Other causes include gout, inflammation or infection of the joint, sprains, or allergies.  Symptoms of this condition include redness, swelling, or stiffness of the joint. Other symptoms include warmth, fever, or feeling ill.  This condition is treated with rest, elevation, medicines, and applying cold or hot packs.  Follow your health care provider's instructions about medicines, activity, exercises, and other home care treatments. This information is not intended to replace advice given to you by your health care provider. Make sure you discuss any questions you have with your health care provider. Document Revised: 05/12/2018 Document Reviewed: 05/12/2018 Elsevier Patient Education  2021 [employer].

## 2020-08-26 ENCOUNTER — Other Ambulatory Visit: Payer: Self-pay | Admitting: *Deleted

## 2020-08-26 ENCOUNTER — Encounter: Payer: Self-pay | Admitting: Radiology

## 2020-08-26 ENCOUNTER — Other Ambulatory Visit: Payer: Self-pay

## 2020-08-26 ENCOUNTER — Ambulatory Visit: Payer: Medicare Other

## 2020-08-26 DIAGNOSIS — I739 Peripheral vascular disease, unspecified: Secondary | ICD-10-CM

## 2020-08-26 DIAGNOSIS — I6521 Occlusion and stenosis of right carotid artery: Secondary | ICD-10-CM

## 2020-08-26 DIAGNOSIS — R002 Palpitations: Secondary | ICD-10-CM

## 2020-08-26 NOTE — Progress Notes (Signed)
Heart monitor ordered. Will be mailed to address.

## 2020-08-26 NOTE — Progress Notes (Signed)
Enrolled patient for a 7 day Zio XT Monitor to be mailed to patients home.  

## 2020-08-29 ENCOUNTER — Telehealth: Payer: Self-pay | Admitting: Internal Medicine

## 2020-08-29 NOTE — Telephone Encounter (Signed)
Noted  She can try extra strength Tylenol for pain  will need to f/u with kidney doctor and cardiology 09/2020 about blood pressure control    Please fax my last note to renal (Dr. Candiss Norse I think see care everywhere with this message). BP uncontrolled on current meds

## 2020-08-29 NOTE — Telephone Encounter (Signed)
Requested via epic routing

## 2020-08-29 NOTE — Telephone Encounter (Signed)
For your information  

## 2020-08-29 NOTE — Telephone Encounter (Signed)
patient called in and stated that she is not going to take the medication that was prescribed  traMADol (ULTRAM) 50 MG tablet, metoprolol succinate (TOPROL XL) 25 MG 24 hr tablet ,  citalopram (CELEXA) 10 MG tablet

## 2020-08-30 ENCOUNTER — Encounter: Payer: Self-pay | Admitting: Internal Medicine

## 2020-08-30 ENCOUNTER — Ambulatory Visit (HOSPITAL_COMMUNITY): Payer: Medicare Other

## 2020-08-30 ENCOUNTER — Encounter: Payer: Medicare Other | Admitting: Vascular Surgery

## 2020-08-30 NOTE — Telephone Encounter (Signed)
LMTCB

## 2020-08-31 ENCOUNTER — Telehealth: Payer: Self-pay | Admitting: *Deleted

## 2020-08-31 NOTE — Addendum Note (Signed)
Addended by: Thressa Sheller on: 08/31/2020 10:33 AM   Modules accepted: Orders

## 2020-08-31 NOTE — Telephone Encounter (Signed)
Patient states she is unable to apply ZIO XT patch monitor herself. Patient scheduled to come to [place] office Monday , 09/05/20, at 10:00AM , to have monitor applied. Patient will call to cancel if she cannot arrange transportation.

## 2020-09-01 NOTE — Telephone Encounter (Signed)
-----   Message from Delorise Jackson, MD sent at 08/25/2020  1:15 PM EST ----- Fax note Dr .Candiss Norse renal Thanks Need comment on BP on cover ?

## 2020-09-01 NOTE — Telephone Encounter (Signed)
Faxed via epic routing

## 2020-09-02 NOTE — Telephone Encounter (Signed)
Information has been abstracted in the Patient's chart

## 2020-09-05 ENCOUNTER — Other Ambulatory Visit (INDEPENDENT_AMBULATORY_CARE_PROVIDER_SITE_OTHER): Payer: Medicare Other

## 2020-09-05 ENCOUNTER — Other Ambulatory Visit: Payer: Self-pay

## 2020-09-05 ENCOUNTER — Encounter: Payer: Self-pay | Admitting: *Deleted

## 2020-09-05 DIAGNOSIS — R002 Palpitations: Secondary | ICD-10-CM | POA: Diagnosis not present

## 2020-09-05 NOTE — Progress Notes (Signed)
Patient ID: Alexis Farmer, female   DOB: 08-20-1940, 80 y.o.   MRN: [id number] 7 Day ZIO XT monitor which was mailed to patient was applied in office on 09/05/20.

## 2020-09-08 ENCOUNTER — Telehealth: Payer: Self-pay | Admitting: Cardiovascular Disease

## 2020-09-08 NOTE — Telephone Encounter (Signed)
Called patient, advised of message below.   Patient advised to let us know if her reaction does not get any better- she states that she is feeling better since removing the monitor.

## 2020-09-08 NOTE — Telephone Encounter (Signed)
Patient said she may have had an allergic reaction to the adhesive for the heart monitor. She was incredibly swollen where the adhesive was. She called the company and they advised her to take it off.  The patient said she only got to wear it for 3 days. She hopes that Dr. Audie Box can get enough medication from the monitor

## 2020-09-08 NOTE — Telephone Encounter (Signed)
Left detailed message for pt stating that Dr. Audie Box is ok with the 3 days of info that we were able to get. Advised pt to call back with questions or concerns.

## 2020-09-08 NOTE — Telephone Encounter (Signed)
That is fine.   [name] T. Audie Box, MD, [facility]  [address] [place], [place] [address] [phone]  3:51 PM

## 2020-09-12 ENCOUNTER — Ambulatory Visit: Payer: Medicare Other | Admitting: Cardiovascular Disease

## 2020-09-13 ENCOUNTER — Telehealth: Payer: Medicare Other | Admitting: Internal Medicine

## 2020-09-13 ENCOUNTER — Encounter: Payer: Self-pay | Admitting: Internal Medicine

## 2020-09-13 DIAGNOSIS — F419 Anxiety disorder, unspecified: Secondary | ICD-10-CM

## 2020-09-13 DIAGNOSIS — F439 Reaction to severe stress, unspecified: Secondary | ICD-10-CM

## 2020-09-13 DIAGNOSIS — F339 Major depressive disorder, recurrent, unspecified: Secondary | ICD-10-CM

## 2020-09-13 NOTE — Progress Notes (Signed)
Telephone Note  I connected with Alexis Farmer Kitchen  on 09/14/20 at  2:30 PM EDT by telephone and verified that I am speaking with the correct person using two identifiers.  Location patient: home, [place] Location provider:work or home office Persons participating in the virtual visit: patient, provider  I discussed the limitations of evaluation and management by telemedicine and the availability of in person appointments. The patient expressed understanding and agreed to proceed.   HPI: 1. Pt upset renal appt 09/12/20 where d/c zetia 10 did not know what this is and kidney doctor cma d/c insulin regular, vitamin C and she wants to know why it states this on sheet as she is on 2 types of insulin and wants to continue take vitamin C  Spoke with Dr. Candiss Norse and he is ok with her continuing vitamin C did not add any other vitamin or change any medication  I reviewed with pt and Dr.Singh renal and cardiology manage fluid pills torsemide 20 mg on as of 06/2020 and spironolactone and cards and renal need to know if each one of them is changing the fluid pill dose   Pt wants to be involved in health decisions and wants the doctors to talk to her about her care and changes and did not feel like this visi 09/12/20 and I also disc this with Dr. Candiss Norse   2. Depression wants # for therapy again she has family stressors and worries  She feels left out and forgot about with her family and she lives alone and is lonely  -COVID-19 vaccine status:3/3  ROS: See pertinent positives and negatives per HPI.  Past Medical History:  Diagnosis Date  . Arthritis   . Chickenpox   . Diabetes mellitus without complication ([place])    one elevated reading/ no treatment  . Diverticulitis   . GI bleed   . High cholesterol   . History of blood transfusion   . Hypertension   . Renal insufficiency     Past Surgical History:  Procedure Laterality Date  . APPENDECTOMY    . CHOLECYSTECTOMY    . ECTOPIC PREGNANCY SURGERY    .  EYE SURGERY     bilateral cataracts  . EYE SURGERY     02/11/2019 repair hole in right eye   . gallbladder     . HYSTEROSCOPY WITH D & C N/A 10/26/2016   Procedure: DILATATION AND CURETTAGE /HYSTEROSCOPY;  Surgeon: Benjaman Kindler, MD;  Location: [facility] ORS;  Service: Gynecology;  Laterality: N/A;  . HYSTEROSCOPY WITH D & C N/A 07/07/2018   Procedure: DILATATION AND CURETTAGE /HYSTEROSCOPY;  Surgeon: Benjaman Kindler, MD;  Location: [facility] ORS;  Service: Gynecology;  Laterality: N/A;  . THYROIDECTOMY, PARTIAL       Current Outpatient Medications:  .  acetaminophen (TYLENOL) 500 MG tablet, Take 500 mg by mouth every 6 (six) hours as needed. , Disp: , Rfl:  .  amLODipine (NORVASC) 10 MG tablet, Take 1 tablet (10 mg total) by mouth daily., Disp: 90 tablet, Rfl: 3 .  blood glucose meter kit and supplies KIT, Accu chek, Dx code E11.65, check 3 times daily, Disp: 1 each, Rfl: 0 .  cloNIDine (CATAPRES) 0.2 MG tablet, Take 1 tablet (0.2 mg total) by mouth 3 (three) times daily., Disp: 270 tablet, Rfl: 3 .  glucose blood test strip, Use as instructed tid  Accuchek Guide, Disp: 300 each, Rfl: 12 .  insulin NPH Human (NOVOLIN N) 100 UNIT/ML injection, INJECT 20 UNITS IN THE MORNING AND 20  UNITS IN THE EVENING WITH A MEAL (pt actually taking 20 qam and 15 units qpm), Disp: 1200 mL, Rfl: 12 .  insulin regular (NOVOLIN R) 100 units/mL injection, 15 minutes before meals tid. 131-180 2 units 181 to 240 4 units 241-300 6units 301-350 8 units 351-400 10 units>400 12 units.  Keep glucose tablets for sugar < 70, Disp: 150 mL, Rfl: 11 .  Insulin Syringe-Needle U-100 (B-D INS SYR ULTRAFINE .5CC/30G) 30G X 1/2" 0.5 ML MISC, 1 Device by Does not apply route daily. Up to 5x per day with insulin bid (NPH) and tid (Regular), Disp: 500 each, Rfl: 12 .  meclizine (ANTIVERT) 12.5 MG tablet, Take 1 tablet (12.5 mg total) by mouth daily as needed for dizziness., Disp: 90 tablet, Rfl: 2 .  Multiple Vitamin (MULTIVITAMIN WITH  MINERALS) TABS tablet, Take 1 tablet by mouth daily., Disp: , Rfl:  .  mupirocin ointment (BACTROBAN) 2 %, Apply 1 application topically 2 (two) times daily. Right index finger and left forearm, Disp: 30 g, Rfl: 2 .  polyethylene glycol powder (GLYCOLAX/MIRALAX) 17 GM/SCOOP powder, Take 17 g by mouth daily as needed for moderate constipation or severe constipation. Can take up to 2x per day prn. Mix with 8 ounces of liquid, Disp: 3350 g, Rfl: 11 .  rosuvastatin (CRESTOR) 20 MG tablet, TAKE 1 TABLET (20 MG TOTAL) BY MOUTH DAILY., Disp: 90 tablet, Rfl: 1 .  spironolactone (ALDACTONE) 25 MG tablet, Take 1 tablet (25 mg total) by mouth daily., Disp: 90 tablet, Rfl: 3 .  torsemide (DEMADEX) 20 MG tablet, Take 1 tablet (20 mg total) by mouth daily., Disp: 90 tablet, Rfl: 1 .  Ascorbic Acid (VITAMIN C PO), Take 500 mg by mouth daily., Disp: , Rfl:   EXAM:  VITALS per patient if applicable:  GENERAL: alert, oriented, appears well and in no acute distress  PSYCH/NEURO: pleasant and cooperative, no obvious depression or anxiety, speech and thought processing grossly intact  ASSESSMENT AND PLAN:  Discussed the following assessment and plan:  Stress  Depression, recurrent ([place])  Anxiety [facility] [place] --call for therapy  [facility] [address]  [place]  [address]  [phone] $99 per hours I believe is the price   Spoke with Dr. Candiss Norse about pts med list from appt 09/12/20 they did not make any changes but encourage and I informed pt to bring in all meds each visit to his office and other [facility] offices so they know what she is taking and do not accidentally delete medications she should be taking   -we discussed possible serious and likely etiologies, options for evaluation and workup, limitations of telemedicine visit vs in person visit, treatment, treatment risks and precautions.     I discussed the assessment and treatment plan with the patient. The  patient was provided an opportunity to ask questions and all were answered. The patient agreed with the plan and demonstrated an understanding of the instructions.    Time spent 10 minutes - no charge Delorise Jackson, MD

## 2020-09-14 ENCOUNTER — Ambulatory Visit: Payer: Medicare Other | Admitting: Endocrinology

## 2020-09-20 ENCOUNTER — Encounter (HOSPITAL_COMMUNITY): Payer: Medicare Other

## 2020-09-20 ENCOUNTER — Encounter: Payer: Medicare Other | Admitting: Vascular Surgery

## 2020-09-20 ENCOUNTER — Telehealth: Payer: Self-pay

## 2020-09-20 ENCOUNTER — Telehealth: Payer: Self-pay | Admitting: Podiatry

## 2020-09-20 NOTE — Telephone Encounter (Signed)
Pt called asking about getting a new pair of diabetic shoes.  Upon looking she is due for a pair and does qualify per Dr Heber Kenyon note. I offered pt an appt for this week but she is not able to make it due to transportation. She asked for the end of the month. I told pt I did not have anything currently but would call her when I have an opening.

## 2020-09-20 NOTE — Chronic Care Management (AMB) (Signed)
  Chronic Care Management   Outreach Note  09/20/2020 Name: Marilisa Bosques MRN: [id number] DOB: 1941-04-01  Ashleylynn Bezanson is a 80 y.o. year old female who is a primary care patient of McLean-Scocuzza, Nino Glow, MD. I reached out to [name] by phone today in response to a referral sent by Ms. Telina Matherne's PCP, McLean-Scocuzza, Nino Glow, MD     An unsuccessful telephone outreach was attempted today. The patient was referred to the case management team for assistance with care management and care coordination.   Follow Up Plan: A HIPAA compliant phone message was left for the patient providing contact information and requesting a return call.  The care management team will reach out to the patient again over the next 7 days.  If patient returns call to provider office, please advise to call [facility] at [place], [place], [place], [place] [address] Direct Dial: [phone] [email or web address] Website: [email or web address]

## 2020-09-20 NOTE — Chronic Care Management (AMB) (Signed)
  Chronic Care Management   Note  09/20/2020 Name: Marielle Mantione MRN: [id number] DOB: [birth year]  Kaelee Pfeffer is a 80 y.o. year old female who is a primary care patient of McLean-Scocuzza, Nino Glow, MD. I reached out to [name] by phone today in response to a referral sent by Ms. Toree Carmical's PCP, McLean-Scocuzza, Nino Glow, MD     Ms. Dipierro was given information about Chronic Care Management services today including:  1. CCM service includes personalized support from designated clinical staff supervised by her physician, including individualized plan of care and coordination with other care providers 2. 24/7 contact phone numbers for assistance for urgent and routine care needs. 3. Service will only be billed when office clinical staff spend 20 minutes or more in a month to coordinate care. 4. Only one practitioner may furnish and bill the service in a calendar month. 5. The patient may stop CCM services at any time (effective at the end of the month) by phone call to the office staff. 6. The patient will be responsible for cost sharing (co-pay) of up to 20% of the service fee (after annual deductible is met).  Patient agreed to services and verbal consent obtained.   Follow up plan: Telephone appointment with care management team member scheduled for:09/28/2020  Noreene Larsson, [place], [place], [place] [address] Direct Dial: [phone] [email or web address]: [email or web address]

## 2020-09-26 ENCOUNTER — Telehealth: Payer: Self-pay

## 2020-09-26 DIAGNOSIS — E1122 Type 2 diabetes mellitus with diabetic chronic kidney disease: Secondary | ICD-10-CM

## 2020-09-26 DIAGNOSIS — Z794 Long term (current) use of insulin: Secondary | ICD-10-CM

## 2020-09-26 DIAGNOSIS — N1832 Chronic kidney disease, stage 3b: Secondary | ICD-10-CM

## 2020-09-26 DIAGNOSIS — E1165 Type 2 diabetes mellitus with hyperglycemia: Secondary | ICD-10-CM

## 2020-09-26 MED ORDER — "BD INSULIN SYRINGE ULTRAFINE 30G X 1/2"" 0.5 ML MISC": 1.0000 | Freq: Every day | 12 refills | Status: DC

## 2020-09-26 NOTE — Telephone Encounter (Signed)
Pt called and needs refills on Insulin Syringe-Needle U-100 (B-D INS SYR ULTRAFINE .5CC/30G) 30G X 1/2" 0.5 ML MISC to [facility] on [address]

## 2020-09-28 ENCOUNTER — Ambulatory Visit (INDEPENDENT_AMBULATORY_CARE_PROVIDER_SITE_OTHER): Payer: Medicare Other | Admitting: *Deleted

## 2020-09-28 DIAGNOSIS — I5032 Chronic diastolic (congestive) heart failure: Secondary | ICD-10-CM | POA: Diagnosis not present

## 2020-09-28 DIAGNOSIS — E1165 Type 2 diabetes mellitus with hyperglycemia: Secondary | ICD-10-CM

## 2020-09-28 NOTE — Patient Instructions (Signed)
Visit Information   Nice meeting and speaking with you.  Please try to keep your blood sugars in good range and prevent hypoglycemic episodes.  Please consider purchasing a scale for home to weigh yourself daily.  I have attached education for you to review.  Please give me a call with any questions or concerns.  [name]  [phone]   PATIENT GOALS:  Goals Addressed            This Visit's Progress   . (RNCM) Monitor and Manage My Blood Sugar       Timeframe:  Long-Range Goal Priority:  High Start Date:    09/28/2020                         Expected End Date:  03/17/21                      Follow Up Date 10/19/20   . Check blood sugar at least 4 times a day . Check blood sugar if I feel it is too high or too low . Enter blood sugar readings and medication/insulin into daily log . Take the blood sugar log to all doctor visits  . Discuss goal A1C at endocrinology upcoming appointment . Attend Endocrinology Appointment on 10/17/20   Why is this important?   Checking your blood sugar at home helps to keep it from getting very high or very low.  Writing the results in a diary or log helps the doctor know how to care for you.  Your blood sugar log should have the time, date and the results.  Also, write down the amount of insulin or other medicine that you take.  Other information, like what you ate, exercise done and how you were feeling, will also be helpful.     Notes:     Marland Kitchen (RNCM) Track and Manage Fluids and Swelling-Heart Failure       Timeframe:  Long-Range Goal Priority:  Medium Start Date:   09/28/20                          Expected End Date:  03/17/21                     Follow Up Date 10/19/20    . Obtain scale for home (large print or verbally read out numbers) . Call office if I gain more than 2 pounds in one day or 5 pounds in one week . Use salt in moderation . Watch for swelling in feet, ankles and legs every day . Weigh myself daily and write weights in log . Take  log to all provider appointments   Why is this important?    It is important to check your weight daily and watch how much salt and liquids you have.   It will help you to manage your heart failure.    Notes:     Consent to CCM Services: Ms. [name] was given information about Chronic Care Management services today including:  1. CCM service includes personalized support from designated clinical staff supervised by her physician, including individualized plan of care and coordination with other care providers 2. 24/7 contact phone numbers for assistance for urgent and routine care needs. 3. Service will only be billed when office clinical staff spend 20 minutes or more in a month to coordinate care. 4. Only one practitioner may furnish and bill  the service in a calendar month. 5. The patient may stop CCM services at any time (effective at the end of the month) by phone call to the office staff. 6. The patient will be responsible for cost sharing (co-pay) of up to 20% of the service fee (after annual deductible is met).  Patient agreed to services and verbal consent obtained.   The patient verbalized understanding of instructions, educational materials, and care plan provided today and agreed to receive a mailed copy of patient instructions, educational materials, and care plan.   The care management team will reach out to the patient again over the next 30 business days.   [name] RN, MSN RN Care Management Coordinator [place] [phone] [email or web address]   Heart Failure, Diagnosis  Heart failure means that your heart is not able to pump blood in the right way. This makes it hard for your body to work well. Heart failure is usually a long-term (chronic) condition. You must take good care of yourself and follow your treatment plan from your doctor. What are the causes?  High blood pressure.  Buildup of cholesterol and fat in  the arteries.  Heart attack. This injures the heart muscle.  Heart valves that do not open and close properly.  Damage of the heart muscle. This is also called cardiomyopathy.  Infection of the heart muscle. This is also called myocarditis.  Lung disease. What increases the risk?  Getting older. The risk of heart failure goes up as a person ages.  Being overweight.  Being female.  Use tobacco or nicotine products.  Abusing alcohol or drugs.  Having taken medicines that can damage the heart.  Having any of these conditions: ? Diabetes. ? Abnormal heart rhythms. ? Thyroid problems. ? Low blood counts (anemia).  Having a family history of heart failure. What are the signs or symptoms?  Shortness of breath.  Coughing.  Swelling of the feet, ankles, legs, or belly.  Losing or gaining weight for no reason.  Trouble breathing.  Waking from sleep because of the need to sit up and get more air.  Fast heartbeat.  Being very tired.  Feeling dizzy, or feeling like you may pass out (faint).  Having no desire to eat.  Feeling like you may vomit (nauseous).  Peeing (urinating) more at night.  Feeling confused. How is this treated? This condition may be treated with:  Medicines. These can be given to treat blood pressure and to make the heart muscles stronger.  Changes in your daily life. These may include: ? Eating a healthy diet. ? Staying at a healthy body weight. ? Quitting tobacco, alcohol, and drug use. ? Doing exercises. ? Participating in a cardiac rehabilitation program. This program helps you improve your health through exercise, education, and counseling.  Surgery. Surgery can be done to open blocked valves, or to put devices in the heart, such as pacemakers.  A donor heart (heart transplant). You will receive a healthy heart from a donor. Follow these instructions at home:  Treat other conditions as told by your doctor. These may include high  blood pressure, diabetes, thyroid disease, or abnormal heart rhythms.  Learn as much as you can about heart failure.  Get support as you need it.  Keep all follow-up visits. Summary  Heart failure means that your heart is not able to pump blood in the right way.  This condition is often caused by high blood pressure, heart attack, or damage of the heart muscle.  Symptoms of this condition include shortness of breath and swelling of the feet, ankles, legs, or belly. You may also feel very tired or feel like you may vomit.  You may be treated with medicines, surgery, or changes in your daily life.  Treat other health conditions as told by your doctor. This information is not intended to replace advice given to you by your health care provider. Make sure you discuss any questions you have with your health care provider. Document Revised: 12/26/2019 Document Reviewed: 12/26/2019 Elsevier Patient Education  2021 [place].  https://www.diabeteseducator.org/docs/default-source/living-with-diabetes/conquering-the-grocery-store-v1.pdf?sfvrsn=4">  Carbohydrate Counting for Diabetes Mellitus, Adult Carbohydrate counting is a method of keeping track of how many carbohydrates you eat. Eating carbohydrates naturally increases the amount of sugar (glucose) in the blood. Counting how many carbohydrates you eat improves your blood glucose control, which helps you manage your diabetes. It is important to know how many carbohydrates you can safely have in each meal. This is different for every person. A dietitian can help you make a meal plan and calculate how many carbohydrates you should have at each meal and snack. What foods contain carbohydrates? Carbohydrates are found in the following foods:  Grains, such as breads and cereals.  Dried beans and soy products.  Starchy vegetables, such as potatoes, peas, and corn.  Fruit and fruit juices.  Milk and yogurt.  Sweets and snack foods, such  as cake, cookies, candy, chips, and soft drinks.   How do I count carbohydrates in foods? There are two ways to count carbohydrates in food. You can read food labels or learn standard serving sizes of foods. You can use either of the methods or a combination of both. Using the Nutrition Facts label The Nutrition Facts list is included on the labels of almost all packaged foods and beverages in the [place] It includes:  The serving size.  Information about nutrients in each serving, including the grams (g) of carbohydrate per serving. To use the Nutrition Facts:  Decide how many servings you will have.  Multiply the number of servings by the number of carbohydrates per serving.  The resulting number is the total amount of carbohydrates that you will be having. Learning the standard serving sizes of foods When you eat carbohydrate foods that are not packaged or do not include Nutrition Facts on the label, you need to measure the servings in order to count the amount of carbohydrates.  Measure the foods that you will eat with a food scale or measuring cup, if needed.  Decide how many standard-size servings you will eat.  Multiply the number of servings by 15. For foods that contain carbohydrates, one serving equals 15 g of carbohydrates. ? For example, if you eat 2 cups or 10 oz (300 g) of strawberries, you will have eaten 2 servings and 30 g of carbohydrates (2 servings x 15 g = 30 g).  For foods that have more than one food mixed, such as soups and casseroles, you must count the carbohydrates in each food that is included. The following list contains standard serving sizes of common carbohydrate-rich foods. Each of these servings has about 15 g of carbohydrates:  1 slice of bread.  1 six-inch (15 cm) tortilla.  ? cup or 2 oz (53 g) cooked rice or pasta.   cup or 3 oz (85 g) cooked or canned, drained and rinsed beans or lentils.   cup or 3 oz (85 g) starchy vegetable, such as peas,  corn, or squash.  cup or 4 oz (120 g) hot cereal.   cup or 3 oz (85 g) boiled or mashed potatoes, or  or 3 oz (85 g) of a large baked potato.   cup or 4 fl oz (118 mL) fruit juice.  1 cup or 8 fl oz (237 mL) milk.  1 small or 4 oz (106 g) apple.   or 2 oz (63 g) of a medium banana.  1 cup or 5 oz (150 g) strawberries.  3 cups or 1 oz (24 g) popped popcorn. What is an example of carbohydrate counting? To calculate the number of carbohydrates in this sample meal, follow the steps shown below. Sample meal  3 oz (85 g) chicken breast.  ? cup or 4 oz (106 g) brown rice.   cup or 3 oz (85 g) corn.  1 cup or 8 fl oz (237 mL) milk.  1 cup or 5 oz (150 g) strawberries with sugar-free whipped topping. Carbohydrate calculation 1. Identify the foods that contain carbohydrates: ? Rice. ? Corn. ? Milk. ? Strawberries. 2. Calculate how many servings you have of each food: ? 2 servings rice. ? 1 serving corn. ? 1 serving milk. ? 1 serving strawberries. 3. Multiply each number of servings by 15 g: ? 2 servings rice x 15 g = 30 g. ? 1 serving corn x 15 g = 15 g. ? 1 serving milk x 15 g = 15 g. ? 1 serving strawberries x 15 g = 15 g. 4. Add together all of the amounts to find the total grams of carbohydrates eaten: ? 30 g + 15 g + 15 g + 15 g = 75 g of carbohydrates total. What are tips for following this plan? Shopping  Develop a meal plan and then make a shopping list.  Buy fresh and frozen vegetables, fresh and frozen fruit, dairy, eggs, beans, lentils, and whole grains.  Look at food labels. Choose foods that have more fiber and less sugar.  Avoid processed foods and foods with added sugars. Meal planning  Aim to have the same amount of carbohydrates at each meal and for each snack time.  Plan to have regular, balanced meals and snacks. Where to find more information  American Diabetes Association: www.diabetes.org  Centers for Disease Control and  Prevention: http://www.wolf.info/ Summary  Carbohydrate counting is a method of keeping track of how many carbohydrates you eat.  Eating carbohydrates naturally increases the amount of sugar (glucose) in the blood.  Counting how many carbohydrates you eat improves your blood glucose control, which helps you manage your diabetes.  A dietitian can help you make a meal plan and calculate how many carbohydrates you should have at each meal and snack. This information is not intended to replace advice given to you by your health care provider. Make sure you discuss any questions you have with your health care provider. Document Revised: 06/04/2019 Document Reviewed: 06/05/2019 Elsevier Patient Education  2021 [place].   CLINICAL CARE PLAN: Patient Care Plan: Diabetes Type 2 (Adult)  Problem Identified: Glycemic Management (Diabetes, Type 2)   Priority: High  Goal: Patient will verbalize decrease in Hgb A1C by 0.3 points within the next 90 days   Start Date: 09/28/2020  Expected End Date: 03/17/2021  Recent Progress: Not on track  Priority: High  Objective:  Lab Results  Component Value Date   HGBA1C 9.3 (A) 08/25/2020  Current Barriers:  Marland Kitchen Knowledge Deficits related to basic Diabetes pathophysiology and self care/management as evidenced  by continued elevated blood sugars and Hgb A1C.  Fasting blood sugar this morning was 210. Patient stating her fasting blood sugars varies pending what she has eaten the night before.  Does report hypoglycemia in the mornings at times with fasting sugars of 60's and sometimes being too symptomatic (shaky) to check blood sugar at the time, instead treating the hypoglycemia with juice or candy to stop shaking.  Acknowledges she does not eat correct at times to manage her diabetes.  Reports being compliant with all her medications and insulins.  Has scheduled consultation with Endocrinologist on 10/17/20. . Lack of self motivation to self management of diabetes Case  Manager Clinical Goal(s):  . patient will demonstrate improved adherence to prescribed treatment plan for diabetes self care/management as evidenced by: at least 4 times a day monitoring and recording of CBG,  adherence to ADA/ carb modified diet, adherence to prescribed medication regimen, contacting provider for new or worsened symptoms or questions Interventions:  . Collaboration with McLean-Scocuzza, Nino Glow, MD regarding development and update of comprehensive plan of care as evidenced by provider attestation and co-signature . Inter-disciplinary care team collaboration (see longitudinal plan of care) . Provided education to patient about basic DM disease process . Reviewed medications with patient and discussed importance of medication adherence . Discussed plans with patient for ongoing care management follow up and provided patient with direct contact information for care management team . Provided patient with written educational materials related to hypo and hyperglycemia and importance of correct treatment . Reviewed scheduled/upcoming provider appointments including: Endocrinology consultation 10/17/20 and eye exam on 11/15/20 . Advised patient, providing education and rationale, to check cbg at least 4 times a day and record, calling primary care provider or endocrinologist after consultation for findings outside established parameters.   . Sending Diabetes Education . Discussed and offered to arrange Diabetes Education and Nutrition Class (declines at this time) . Discussed diabetic carbohydrate modified diet and foods to limit or avoid; Provided medical nutrition therapy and development of individualized eating . Discussed current Hgb A1C and what is normal and what is patient's goal; per patient her goal is below 10-discussed and encouraged patient to review goal with provider and to consider changing goal to lower  Promoted self-monitoring of blood glucose levels.   Assessed and  addressed barriers to management plan, such as food insecurity, age, developmental ability, depression, anxiety, fear of hypoglycemia or weight gain, as well as medication cost, side effects and complicated regimen.   Discussed and offered CCM Social Work referral for periods of depression (patient declines at this time) Patient Goals/Self-Care Activities: . Check blood sugar at least 4 times a day . Check blood sugar if I feel it is too high or too low . Enter blood sugar readings and medication/insulin into daily log . Take the blood sugar log to all doctor visits  . Discuss goal A1C at endocrinology upcoming appointment . Attend Endocrinology Appointment on 10/17/20 Follow Up Plan: The care management team will reach out to the patient again over the next 30 business days.     Patient Care Plan: Heart Failure (Adult)  Problem Identified: Symptom Exacerbation (Heart Failure)   Priority: Medium  Long-Range Goal: Patient will report obtaining scale for home within the next 90 days   Start Date: 09/28/2020  Expected End Date: 03/17/2021  Priority: Medium  Current Barriers:  Marland Kitchen Knowledge deficit related to basic heart failure pathophysiology and self care management as evidenced by not knowing heart failure diagnosis and  treatment/self care management.  Reports not weighing daily.  Does not have scale at home and feels she may have hard time seeing weight on scale.  Does report compliance with fluid medications. . Knowledge Deficits related to heart failure medications Case Manager Clinical Goal(s):  . patient will obtain scale to weigh self daily and record . patient will verbalize understanding of Heart Failure Action Plan and when to call doctor . patient will take all Heart Failure mediations as prescribed . patient will weigh daily and record (notifying MD of 3 lb weight gain over night or 5 lb in a week) Interventions:  . Collaboration with McLean-Scocuzza, Nino Glow, MD regarding  development and update of comprehensive plan of care as evidenced by provider attestation and co-signature . Inter-disciplinary care team collaboration (see longitudinal plan of care) . Basic overview and discussion of pathophysiology of Heart Failure reviewed  . Provided verbal education on low sodium diet . Assessed need for readable accurate scales in home and encouraged patient to obtain scale with large print or verbally read out weight to patient . Provided education about placing scale on hard, flat surface . Advised patient to weigh each morning after emptying bladder . Discussed importance of daily weight and advised patient to weigh and record daily . Reviewed role of diuretics in prevention of fluid overload and management of heart failure . Barriers to lifestyle changes reviewed and addressed; barriers to treatment reviewed and addressed . Depression screen reviewed . Healthy lifestyle promoted . Self-awareness of signs/symptoms of worsening disease encouraged . Assessed understanding of adherence and barriers to treatment plan, as well as lifestyle changes; discussed strategies to address barriers . Sending education on heart failure Patient Goals/Self-Care Activities: . Obtain scale for home (large print or verbally read out numbers) . Call office if I gain more than 2 pounds in one day or 5 pounds in one week . Use salt in moderation . Watch for swelling in feet, ankles and legs every day . Weigh myself daily and write weights in log . Take log to all provider appointments Follow Up Plan: The care management team will reach out to the patient again over the next 30 business days.

## 2020-09-28 NOTE — Chronic Care Management (AMB) (Signed)
Chronic Care Management   CCM RN Visit Note  09/28/2020 Name: Alexis Farmer MRN: [id number] DOB: 1940/06/23  Subjective: Alexis Farmer is a 80 y.o. year old female who is a primary care patient of McLean-Scocuzza, Nino Glow, MD. The care management team was consulted for assistance with disease management and care coordination needs.    Engaged with patient by telephone for initial visit in response to provider referral for case management and/or care coordination services.   Consent to Services:  The patient was given the following information about Chronic Care Management services today, agreed to services, and gave verbal consent: 1. CCM service includes personalized support from designated clinical staff supervised by the primary care provider, including individualized plan of care and coordination with other care providers 2. 24/7 contact phone numbers for assistance for urgent and routine care needs. 3. Service will only be billed when office clinical staff spend 20 minutes or more in a month to coordinate care. 4. Only one practitioner may furnish and bill the service in a calendar month. 5.The patient may stop CCM services at any time (effective at the end of the month) by phone call to the office staff. 6. The patient will be responsible for cost sharing (co-pay) of up to 20% of the service fee (after annual deductible is met). Patient agreed to services and consent obtained.  Patient agreed to services and verbal consent obtained.   Assessment: Review of patient past medical history, allergies, medications, health status, including review of consultants reports, laboratory and other test data, was performed as part of comprehensive evaluation and provision of chronic care management services.   SDOH (Social Determinants of Health) assessments and interventions performed:  SDOH Interventions   Flowsheet Row Most Recent Value  SDOH Interventions   Food Insecurity Interventions  Intervention Not Indicated  Financial Strain Interventions Intervention Not Indicated  Intimate Partner Violence Interventions Intervention Not Indicated  Transportation Interventions Intervention Not Indicated       CCM Care Plan  Allergies  Allergen Reactions  . Jardiance [Empagliflozin]   . Penicillin V     Other reaction(s): rash  . Penicillins Rash    Has patient had a PCN reaction causing immediate rash, facial/tongue/throat swelling, SOB or lightheadedness with hypotension: Yes Has patient had a PCN reaction causing severe rash involving mucus membranes or skin necrosis: No Has patient had a PCN reaction that required hospitalization No Has patient had a PCN reaction occurring within the last 10 years: Yes If all of the above answers are "NO", then may proceed with Cephalosporin use.     Outpatient Encounter Medications as of 09/28/2020  Medication Sig Note  . acetaminophen (TYLENOL) 500 MG tablet Take 500 mg by mouth every 6 (six) hours as needed.    Marland Kitchen amLODipine (NORVASC) 10 MG tablet Take 1 tablet (10 mg total) by mouth daily.   . Ascorbic Acid (VITAMIN C PO) Take 500 mg by mouth daily.   . cloNIDine (CATAPRES) 0.2 MG tablet Take 1 tablet (0.2 mg total) by mouth 3 (three) times daily.   . insulin NPH Human (NOVOLIN N) 100 UNIT/ML injection INJECT 20 UNITS IN THE MORNING AND 20 UNITS IN THE EVENING WITH A MEAL (pt actually taking 20 qam and 15 units qpm)   . insulin regular (NOVOLIN R) 100 units/mL injection 15 minutes before meals tid. 131-180 2 units 181 to 240 4 units 241-300 6units 301-350 8 units 351-400 10 units>400 12 units.  Keep glucose tablets for sugar < 70   .  meclizine (ANTIVERT) 12.5 MG tablet Take 1 tablet (12.5 mg total) by mouth daily as needed for dizziness.   . Multiple Vitamin (MULTIVITAMIN WITH MINERALS) TABS tablet Take 1 tablet by mouth daily.   . polyethylene glycol powder (GLYCOLAX/MIRALAX) 17 GM/SCOOP powder Take 17 g by mouth daily as needed for  moderate constipation or severe constipation. Can take up to 2x per day prn. Mix with 8 ounces of liquid   . rosuvastatin (CRESTOR) 20 MG tablet TAKE 1 TABLET (20 MG TOTAL) BY MOUTH DAILY.   Marland Kitchen spironolactone (ALDACTONE) 25 MG tablet Take 1 tablet (25 mg total) by mouth daily.   Marland Kitchen torsemide (DEMADEX) 20 MG tablet Take 1 tablet (20 mg total) by mouth daily. 09/28/2020: Reports renal doctor decreased to 10 mg daily  . blood glucose meter kit and supplies KIT Accu chek, Dx code E11.65, check 3 times daily   . glucose blood test strip Use as instructed tid  Accuchek Guide   . Insulin Syringe-Needle U-100 (B-D INS SYR ULTRAFINE .5CC/30G) 30G X 1/2" 0.5 ML MISC 1 Device by Does not apply route daily. Up to 5x per day with insulin bid (NPH) and tid (Regular)   . mupirocin ointment (BACTROBAN) 2 % Apply 1 application topically 2 (two) times daily. Right index finger and left forearm    No facility-administered encounter medications on file as of 09/28/2020.    Patient Active Problem List   Diagnosis Date Noted  . Other chronic pain 08/25/2020  . Hypertension associated with diabetes ([place]) 08/25/2020  . 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  . Pure hypercholesterolemia 08/10/2020  . 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 Retinopathy 05/30/2020  . Primary hypertension 03/25/2020  . Multinodular goiter 02/04/2020  . Peripheral edema 12/22/2019  . Proteinuria 12/22/2019  . Constipation 11/17/2019  . Recurrent falls 08/13/2019  . Physical deconditioning 08/13/2019  . Abdominal aortic atherosclerosis ([place]) 07/24/2019  . Morbid obesity with BMI of 45.0-49.9, adult ([employer]) 06/09/2019  . Hypercalcemia 03/18/2019  . Round hole, unspecified eye 02/06/2019  . 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 Disorder of rotator cuff 06/24/2018  . Abnormal gait 03/26/2018  . Fall 03/26/2018  . Arthritis 03/26/2018  . Thyroid nodule  01/20/2018  . Dysuria 03/22/2017  . Cervical spondylosis with radiculopathy 01/26/2017  . Rectal hemorrhage 07/29/2016  . Carotid artery stenosis 07/19/2016  . PAD (peripheral artery disease) ([place]) 07/19/2016  . Swelling of lower leg 07/19/2016  . Bradycardia 07/17/2016  . Postmenopausal bleeding 07/17/2016  . Chronic diastolic heart failure ([place]) 05/31/2016  . Cough 01/25/2016  . Chronic fatigue 11/15/2015  . Depression, recurrent ([place]) 10/13/2015  . Osteoarthritis 10/13/2015  . Vertigo 10/13/2015  . Anxiety 09/13/2015  . Gastrointestinal hemorrhage 08/03/2015  . Hyperglycemia due to type 2 diabetes mellitus ([place]) 03/11/2015  . Mixed hyperlipidemia 08/26/2014  . Essential hypertension 04/08/2014  . Diabetes mellitus type II, uncontrolled ([place]) 04/08/2014  . Chronic kidney disease, stage 2 (mild) 04/08/2014  . Edema of foot 04/08/2014  . Morbid obesity ([place]) 04/08/2014  . Low back pain 04/08/2014  . Diabetic polyneuropathy ([place]) 04/08/2014  . Type 2 diabetes mellitus with other diabetic kidney complication ([place]) 29/56/2130    Conditions to be addressed/monitored:CHF and DMII  Care Plan : Diabetes Type 2 (Adult)  Updates made by Leona Singleton, RN since 09/28/2020 12:00 AM  Problem: Glycemic Management (Diabetes, Type 2)   Priority: High  Goal: Patient will verbalize decrease in Hgb A1C  by 0.3 points within the next 90 days   Start Date: 09/28/2020  Expected End Date: 03/17/2021  Recent Progress: Not on track  Priority: High  Objective:  Lab Results  Component Value Date   HGBA1C 9.3 (A) 08/25/2020  Current Barriers:  Marland Kitchen Knowledge Deficits related to basic Diabetes pathophysiology and self care/management as evidenced by continued elevated blood sugars and Hgb A1C.  Fasting blood sugar this morning was 210. Patient stating her fasting blood sugars varies pending what she has eaten the night before.  Does report hypoglycemia in the mornings at times with fasting sugars of  60's and sometimes being too symptomatic (shaky) to check blood sugar at the time, instead treating the hypoglycemia with juice or candy to stop shaking.  Acknowledges she does not eat correct at times to manage her diabetes.  Reports being compliant with all her medications and insulins.  Has scheduled consultation with Endocrinologist on 10/17/20. . Lack of self motivation to self management of diabetes Case Manager Clinical Goal(s):  . patient will demonstrate improved adherence to prescribed treatment plan for diabetes self care/management as evidenced by: at least 4 times a day monitoring and recording of CBG,  adherence to ADA/ carb modified diet, adherence to prescribed medication regimen, contacting provider for new or worsened symptoms or questions Interventions:  . Collaboration with McLean-Scocuzza, Nino Glow, MD regarding development and update of comprehensive plan of care as evidenced by provider attestation and co-signature . Inter-disciplinary care team collaboration (see longitudinal plan of care) . Provided education to patient about basic DM disease process . Reviewed medications with patient and discussed importance of medication adherence . Discussed plans with patient for ongoing care management follow up and provided patient with direct contact information for care management team . Provided patient with written educational materials related to hypo and hyperglycemia and importance of correct treatment . Reviewed scheduled/upcoming provider appointments including: Endocrinology consultation 10/17/20 and eye exam on 11/15/20 . Advised patient, providing education and rationale, to check cbg at least 4 times a day and record, calling primary care provider or endocrinologist after consultation for findings outside established parameters.   . Sending Diabetes Education . Discussed and offered to arrange Diabetes Education and Nutrition Class (declines at this time) . Discussed diabetic  carbohydrate modified diet and foods to limit or avoid; Provided medical nutrition therapy and development of individualized eating . Discussed current Hgb A1C and what is normal and what is patient's goal; per patient her goal is below 10-discussed and encouraged patient to review goal with provider and to consider changing goal to lower  Promoted self-monitoring of blood glucose levels.   Assessed and addressed barriers to management plan, such as food insecurity, age, developmental ability, depression, anxiety, fear of hypoglycemia or weight gain, as well as medication cost, side effects and complicated regimen.   Discussed and offered CCM Social Work referral for periods of depression (patient declines at this time) Patient Goals/Self-Care Activities: . Check blood sugar at least 4 times a day . Check blood sugar if I feel it is too high or too low . Enter blood sugar readings and medication/insulin into daily log . Take the blood sugar log to all doctor visits  . Discuss goal A1C at endocrinology upcoming appointment . Attend Endocrinology Appointment on 10/17/20 Follow Up Plan: The care management team will reach out to the patient again over the next 30 business days.     Care Plan : Heart Failure (Adult)  Updates made by [name]  D, RN since 09/28/2020 12:00 AM  Problem: Symptom Exacerbation (Heart Failure)   Priority: Medium  Long-Range Goal: Patient will report obtaining scale for home within the next 90 days   Start Date: 09/28/2020  Expected End Date: 03/17/2021  Priority: Medium  Current Barriers:  Marland Kitchen Knowledge deficit related to basic heart failure pathophysiology and self care management as evidenced by not knowing heart failure diagnosis and treatment/self care management.  Reports not weighing daily.  Does not have scale at home and feels she may have hard time seeing weight on scale.  Does report compliance with fluid medications. . Knowledge Deficits related to heart  failure medications Case Manager Clinical Goal(s):  . patient will obtain scale to weigh self daily and record . patient will verbalize understanding of Heart Failure Action Plan and when to call doctor . patient will take all Heart Failure mediations as prescribed . patient will weigh daily and record (notifying MD of 3 lb weight gain over night or 5 lb in a week) Interventions:  . Collaboration with McLean-Scocuzza, Nino Glow, MD regarding development and update of comprehensive plan of care as evidenced by provider attestation and co-signature . Inter-disciplinary care team collaboration (see longitudinal plan of care) . Basic overview and discussion of pathophysiology of Heart Failure reviewed  . Provided verbal education on low sodium diet . Assessed need for readable accurate scales in home and encouraged patient to obtain scale with large print or verbally read out weight to patient . Provided education about placing scale on hard, flat surface . Advised patient to weigh each morning after emptying bladder . Discussed importance of daily weight and advised patient to weigh and record daily . Reviewed role of diuretics in prevention of fluid overload and management of heart failure . Barriers to lifestyle changes reviewed and addressed; barriers to treatment reviewed and addressed . Depression screen reviewed . Healthy lifestyle promoted . Self-awareness of signs/symptoms of worsening disease encouraged . Assessed understanding of adherence and barriers to treatment plan, as well as lifestyle changes; discussed strategies to address barriers . Sending education on heart failure Patient Goals/Self-Care Activities: . Obtain scale for home (large print or verbally read out numbers) . Call office if I gain more than 2 pounds in one day or 5 pounds in one week . Use salt in moderation . Watch for swelling in feet, ankles and legs every day . Weigh myself daily and write weights in  log . Take log to all provider appointments Follow Up Plan: The care management team will reach out to the patient again over the next 30 business days.      Plan:The care management team will reach out to the patient again over the next 30 business days.  [name] RN, MSN RN Care Management Coordinator [place] [phone] [email or web address]

## 2020-09-29 NOTE — Progress Notes (Signed)
Cardiology Office Note:   Date:  09/30/2020  NAME:  Alexis Farmer    MRN: [id number] DOB:  1940/10/24   PCP:  McLean-Scocuzza, Nino Glow, MD  Cardiologist:  No primary care provider on file.  Electrophysiologist:  None   Referring MD: McLean-Scocuzza, Olivia Mackie *   Chief Complaint  Patient presents with  . Follow-up   History of Present Illness:   Alexis Farmer is a 80 y.o. female with a hx of diabetes, carotid artery disease, hyperlipidemia, HFpEF who presents for follow-up.  Seen several months ago.  We recommended Jardiance due to her diabetes and HFpEF.  She is intolerant of this.  She also complained about palpitations and a monitor was pursued.  Monitor was normal. She presents with increasing lower extremity edema.  Weights have increased as well.  Apparently her torsemide was reduced to 10 mg daily by her kidney doctor.  Her blood pressure is elevated as well.  She needs increase in diuretics.  She is reluctant to do this.  However this is my recommendation.  She reports she is not consuming any salt.  She reports she only eats fresh foods.  On further interrogation she does eat packaged and processed things and she is unaware of the salt content.  Her blood pressure is elevated today.  She reports that it is controlled at home but she does not check that often.  I suspect her blood pressure is not controlled in the setting of volume overload.  She denies any chest pain or shortness of breath.  She is not having any further palpitations.  Apparently they were stress related.  Problem list 1. DM -A1c 9.3 2. Carotid artery disease -R ICA 40-59% -R subclavian steal  -L ICA 1-39% 3. HLD -T chol 152, HDL 65, LDL 64, triglycerides 137 4. HFpEF 5. HTN  Past Medical History: Past Medical History:  Diagnosis Date  . Arthritis   . Chickenpox   . Diabetes mellitus without complication ([place])    one elevated reading/ no treatment  . Diverticulitis   . GI bleed   . High cholesterol    . History of blood transfusion   . Hypertension   . Renal insufficiency     Past Surgical History: Past Surgical History:  Procedure Laterality Date  . APPENDECTOMY    . CHOLECYSTECTOMY    . ECTOPIC PREGNANCY SURGERY    . EYE SURGERY     bilateral cataracts  . EYE SURGERY     02/11/2019 repair hole in right eye   . gallbladder     . HYSTEROSCOPY WITH D & C N/A 10/26/2016   Procedure: DILATATION AND CURETTAGE /HYSTEROSCOPY;  Surgeon: Benjaman Kindler, MD;  Location: [facility] ORS;  Service: Gynecology;  Laterality: N/A;  . HYSTEROSCOPY WITH D & C N/A 07/07/2018   Procedure: DILATATION AND CURETTAGE /HYSTEROSCOPY;  Surgeon: Benjaman Kindler, MD;  Location: [facility] ORS;  Service: Gynecology;  Laterality: N/A;  . THYROIDECTOMY, PARTIAL      Current Medications: Current Meds  Medication Sig  . acetaminophen (TYLENOL) 500 MG tablet Take 500 mg by mouth every 6 (six) hours as needed.   Marland Kitchen amLODipine (NORVASC) 10 MG tablet Take 1 tablet (10 mg total) by mouth daily.  . Ascorbic Acid (VITAMIN C PO) Take 500 mg by mouth daily.  . blood glucose meter kit and supplies KIT Accu chek, Dx code E11.65, check 3 times daily  . cloNIDine (CATAPRES) 0.2 MG tablet Take 1 tablet (0.2 mg total) by mouth 3 (three) times  daily.  . glucose blood test strip Use as instructed tid  Accuchek Guide  . insulin NPH Human (NOVOLIN N) 100 UNIT/ML injection INJECT 20 UNITS IN THE MORNING AND 20 UNITS IN THE EVENING WITH A MEAL (pt actually taking 20 qam and 15 units qpm)  . insulin regular (NOVOLIN R) 100 units/mL injection 15 minutes before meals tid. 131-180 2 units 181 to 240 4 units 241-300 6units 301-350 8 units 351-400 10 units>400 12 units.  Keep glucose tablets for sugar < 70  . Insulin Syringe-Needle U-100 (B-D INS SYR ULTRAFINE .5CC/30G) 30G X 1/2" 0.5 ML MISC 1 Device by Does not apply route daily. Up to 5x per day with insulin bid (NPH) and tid (Regular)  . meclizine (ANTIVERT) 12.5 MG tablet Take 1 tablet (12.5 mg  total) by mouth daily as needed for dizziness.  . Multiple Vitamin (MULTIVITAMIN WITH MINERALS) TABS tablet Take 1 tablet by mouth daily.  . mupirocin ointment (BACTROBAN) 2 % Apply 1 application topically 2 (two) times daily. Right index finger and left forearm  . polyethylene glycol powder (GLYCOLAX/MIRALAX) 17 GM/SCOOP powder Take 17 g by mouth daily as needed for moderate constipation or severe constipation. Can take up to 2x per day prn. Mix with 8 ounces of liquid  . rosuvastatin (CRESTOR) 20 MG tablet TAKE 1 TABLET (20 MG TOTAL) BY MOUTH DAILY.  Marland Kitchen spironolactone (ALDACTONE) 25 MG tablet Take 1 tablet (25 mg total) by mouth daily.  . [DISCONTINUED] torsemide (DEMADEX) 20 MG tablet Take 1 tablet (20 mg total) by mouth daily.     Allergies:    Jardiance [empagliflozin], Penicillin v, and Penicillins   Social History: Social History   Socioeconomic History  . Marital status: Widowed    Spouse name: Not on file  . Number of children: 1  . Years of education: 46  . Highest education level: 12th grade  Occupational History  . 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 Smoking status: Never Smoker  . Smokeless tobacco: Never Used  Vaping Use  . Vaping Use: Never used  Substance and Sexual Activity  . Alcohol use: No  . Drug use: No  . Sexual activity: Not Currently  Other Topics Concern  . 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 Difficulty of Paying Living Expenses: Not hard at all  Food Insecurity: No Food Insecurity  . Worried About Charity fundraiser in the Last Year: Never true  . Ran Out of Food in the Last Year: Never  true  Transportation Needs: No Transportation Needs  . Lack of Transportation (Medical): No  . Lack of Transportation (Non-Medical): No  Physical Activity: Inactive  . Days of Exercise per Week: 0 days  . Minutes of Exercise per Session: 0 min  Stress: Stress Concern Present  . Feeling of Stress : To some extent  Social Connections: Socially Isolated  . Frequency of Communication with Friends and Family: More than three times a week  . Frequency of Social Gatherings with Friends and Family: More than three times a week  . Attends Religious Services: Never  .  Active Member of Clubs or Organizations: No  . Attends Archivist Meetings: Never  . Marital Status: Widowed     Family History: The patient's family history includes Diabetes in her mother, sister, and another family member; Healthy in her father; Heart disease in her sister; Hypertension in her mother; Stroke in her mother.  ROS:   All other ROS reviewed and negative. Pertinent positives noted in the HPI.     EKGs/Labs/Other Studies Reviewed:   The following studies were personally reviewed by me today:  [facility] 09/13/2020  Impression: 1. No arrhythmias detected.  2. Rare ectopy.    Recent Labs: 10/24/2019: ALT 34 03/23/2020: TSH 2.00 08/13/2020: B Natriuretic Peptide 25.3; BUN 20; Creatinine, Ser 1.17; Hemoglobin 13.9; Platelets 225; Potassium 4.1; Sodium 136   Recent Lipid Panel    Component Value Date/Time   CHOL 155 03/23/2020 1301   TRIG 81.0 03/23/2020 1301   HDL 67.90 03/23/2020 1301   CHOLHDL 2 03/23/2020 1301   VLDL 16.2 03/23/2020 1301   LDLCALC 71 03/23/2020 1301   LDLDIRECT 96.0 10/28/2018 0951    Physical Exam:   VS:  BP (!) 156/79   Pulse 77   Ht 5' 3" (1.6 m)   Wt 262 lb (118.8 kg)   SpO2 98%   BMI 46.41 kg/m    Wt Readings from Last 3 Encounters:  09/30/20 262 lb (118.8 kg)  08/25/20 259 lb 4 oz (117.6 kg)  06/29/20 263 lb 3.2 oz (119.4 kg)    General: Well nourished, well  developed, in no acute distress Head: Atraumatic, normal size  Eyes: PEERLA, EOMI  Neck: Supple, no JVD Endocrine: No thryomegaly Cardiac: Normal S1, S2; RRR; no murmurs, rubs, or gallops Lungs: Clear to auscultation bilaterally, no wheezing, rhonchi or rales  Abd: Soft, nontender, no hepatomegaly  Ext: 2+ pitting edema up to the knees Musculoskeletal: No deformities, BUE and BLE strength normal and equal Skin: Warm and dry, no rashes   Neuro: Alert and oriented to person, place, time, and situation, CNII-XII grossly intact, no focal deficits  Psych: Normal mood and affect   ASSESSMENT:   Alexis Farmer is a 80 y.o. female who presents for the following: 1. Chronic diastolic heart failure (HCC)   2. Palpitations   3. Essential hypertension   4. Bilateral carotid artery stenosis   5. Mixed hyperlipidemia     PLAN:   1. Chronic diastolic heart failure ([place]) -Her weights have increased.  She has considerable lower extremity edema.  Lungs are clear.  I have recommended to increase her torsemide to 20 mg twice daily for 5 days and then 20 mg daily as a standing dose.  Apparently her kidney doctor reduced her dose to 10 mg daily.  This is resulted in increased weight gain.  We will attempt to increase her dose.  She is reluctant to do this she does not want to affect her kidney functions.  Hopefully she will follow our recommendation. -She does not have any evidence of volume in the lungs.  She is stable to attempt this at home. -Echocardiogram with normal LV function.  2. Palpitations -Monitor shows no evidence of arrhythmia.  Symptoms appear to have been stress related.  They have improved.  3. Essential hypertension -Blood pressure not controlled.  Suspect this is due to increased volume.  Increase torsemide as above.  I have also encouraged her to reduce her salt intake as well as to check her blood pressure at home.  4. Bilateral carotid  artery stenosis -Asymptomatic.  She is not  meeting surgical criteria yet.  No strokelike symptoms.  Repeat carotid ultrasounds are pending.  5. Mixed hyperlipidemia -Most recent LDL cholesterol is at goal.  She will continue Crestor 20 mg daily.  Disposition: Return in about 4 months (around 01/30/2021).  Medication Adjustments/Labs and Tests Ordered: Current medicines are reviewed at length with the patient today.  Concerns regarding medicines are outlined above.  No orders of the defined types were placed in this encounter.  Meds ordered this encounter  Medications  . torsemide (DEMADEX) 20 MG tablet    Sig: Take 2 tablets (40 mg) twice daily for 5 days, then reduce to 1 tablet (20 mg) daily    Dispense:  90 tablet    Refill:  1    Patient Instructions  Medication Instructions:  Increase Torsemide to 40 mg twice daily for 5 days, then decrease to 20 mg daily   *If you need a refill on your cardiac medications before your next appointment, please call your pharmacy*   Follow-Up: At [facility], you and your health needs are our priority.  As part of our continuing mission to provide you with exceptional heart care, we have created designated Provider Care Teams.  These Care Teams include your primary Cardiologist (physician) and Advanced Practice Providers (APPs -  Physician Assistants and Nurse Practitioners) who all work together to provide you with the care you need, when you need it.  We recommend signing up for the patient portal called "MyChart".  Sign up information is provided on this After Visit Summary.  MyChart is used to connect with patients for Virtual Visits (Telemedicine).  Patients are able to view lab/test results, encounter notes, upcoming appointments, etc.  Non-urgent messages can be sent to your provider as well.   To learn more about what you can do with MyChart, go to [email or web address].    Your next appointment:   4 month(s)  The format for your next appointment:   In Person  Provider:    Sande Rives, PA-C       Time Spent with Patient: I have spent a total of 35 minutes with patient reviewing hospital notes, telemetry, EKGs, labs and examining the patient as well as establishing an assessment and plan that was discussed with the patient.  > 50% of time was spent in direct patient care.  Signed, Addison Naegeli. Audie Box, MD, [place]  [address], [place], [place] [address] [phone]  09/30/2020 12:29 PM

## 2020-09-30 ENCOUNTER — Encounter: Payer: Self-pay | Admitting: Cardiovascular Disease

## 2020-09-30 ENCOUNTER — Telehealth: Payer: Self-pay | Admitting: Cardiovascular Disease

## 2020-09-30 ENCOUNTER — Other Ambulatory Visit: Payer: Self-pay

## 2020-09-30 ENCOUNTER — Ambulatory Visit (INDEPENDENT_AMBULATORY_CARE_PROVIDER_SITE_OTHER): Payer: Medicare Other | Admitting: Cardiovascular Disease

## 2020-09-30 VITALS — BP 156/79 | HR 77 | Ht 63.0 in | Wt 262.0 lb

## 2020-09-30 DIAGNOSIS — I6523 Occlusion and stenosis of bilateral carotid arteries: Secondary | ICD-10-CM | POA: Diagnosis not present

## 2020-09-30 DIAGNOSIS — R002 Palpitations: Secondary | ICD-10-CM | POA: Diagnosis not present

## 2020-09-30 DIAGNOSIS — I5032 Chronic diastolic (congestive) heart failure: Secondary | ICD-10-CM | POA: Diagnosis not present

## 2020-09-30 DIAGNOSIS — E782 Mixed hyperlipidemia: Secondary | ICD-10-CM

## 2020-09-30 DIAGNOSIS — I10 Essential (primary) hypertension: Secondary | ICD-10-CM | POA: Diagnosis not present

## 2020-09-30 MED ORDER — TORSEMIDE 20 MG PO TABS: ORAL_TABLET | ORAL | 1 refills | Status: DC

## 2020-09-30 NOTE — Telephone Encounter (Signed)
Patient states she has a water bottle that is 800 ml and she drinks 3 and a half of them daily. She would like to know if this much water could make retain water.

## 2020-09-30 NOTE — Telephone Encounter (Signed)
Patient made aware and verbalized understanding.

## 2020-09-30 NOTE — Telephone Encounter (Signed)
Limit to 2-1/2 bottles of this daily.  [name] T. Audie Box, MD, [place]  [address] [place], [place] [address] [phone]  4:13 PM

## 2020-09-30 NOTE — Patient Instructions (Signed)
Medication Instructions:  Increase Torsemide to 40 mg twice daily for 5 days, then decrease to 20 mg daily   *If you need a refill on your cardiac medications before your next appointment, please call your pharmacy*   Follow-Up: At [facility], you and your health needs are our priority.  As part of our continuing mission to provide you with exceptional heart care, we have created designated Provider Care Teams.  These Care Teams include your primary Cardiologist (physician) and Advanced Practice Providers (APPs -  Physician Assistants and Nurse Practitioners) who all work together to provide you with the care you need, when you need it.  We recommend signing up for the patient portal called "MyChart".  Sign up information is provided on this After Visit Summary.  MyChart is used to connect with patients for Virtual Visits (Telemedicine).  Patients are able to view lab/test results, encounter notes, upcoming appointments, etc.  Non-urgent messages can be sent to your provider as well.   To learn more about what you can do with MyChart, go to [email or web address].    Your next appointment:   4 month(s)  The format for your next appointment:   In Person  Provider:   Sande Rives, PA-C

## 2020-10-04 ENCOUNTER — Ambulatory Visit (INDEPENDENT_AMBULATORY_CARE_PROVIDER_SITE_OTHER): Payer: Medicare Other | Admitting: Vascular Surgery

## 2020-10-04 ENCOUNTER — Ambulatory Visit (INDEPENDENT_AMBULATORY_CARE_PROVIDER_SITE_OTHER)
Admission: RE | Admit: 2020-10-04 | Discharge: 2020-10-04 | Disposition: A | Discharge status: Home / Self Care | Payer: Medicare Other | Source: Ambulatory Visit | Attending: Vascular Surgery | Admitting: Vascular Surgery

## 2020-10-04 ENCOUNTER — Encounter: Payer: Self-pay | Admitting: Vascular Surgery

## 2020-10-04 ENCOUNTER — Other Ambulatory Visit: Payer: Self-pay

## 2020-10-04 ENCOUNTER — Ambulatory Visit (HOSPITAL_COMMUNITY)
Admission: RE | Admit: 2020-10-04 | Discharge: 2020-10-04 | Disposition: A | Discharge status: Home / Self Care | Payer: Medicare Other | Source: Ambulatory Visit | Attending: Vascular Surgery | Admitting: Vascular Surgery

## 2020-10-04 VITALS — BP 172/70 | HR 69 | Temp 97.6°F | Resp 16 | Ht 63.0 in | Wt 259.0 lb

## 2020-10-04 DIAGNOSIS — I6523 Occlusion and stenosis of bilateral carotid arteries: Secondary | ICD-10-CM | POA: Diagnosis not present

## 2020-10-04 DIAGNOSIS — I6521 Occlusion and stenosis of right carotid artery: Secondary | ICD-10-CM | POA: Diagnosis present

## 2020-10-04 DIAGNOSIS — I739 Peripheral vascular disease, unspecified: Secondary | ICD-10-CM | POA: Insufficient documentation

## 2020-10-04 NOTE — Progress Notes (Signed)
[facility] [place]  ASSESSMENT / PLAN: Alexis Farmer is a 80 y.o. 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  Patient counseled patients with asymptomatic peripheral arterial disease or claudication have a 1-2% risk of developing chronic limb threatening ischemia, but a 15-30% risk of mortality in the next 5 years. Intervention should only be considered for medically optimized patients with disabling symptoms.   Recommend the following which can slow the progression of atherosclerosis and reduce the risk of major adverse cardiac / limb events:  Complete cessation from all tobacco products. Blood glucose control with goal A1c < 7%. Blood pressure control with goal blood pressure < 140/90 mmHg. Lipid reduction therapy with goal LDL-C <100 mg/dL (<70 if symptomatic from PAD).  Aspirin 57m PO QD.  Atorvastatin 40-80mg PO QD (or other "high intensity" statin therapy).  Follow up with PA in 1 year for surveillance  CHIEF COMPLAINT: establish care  HISTORY OF PRESENT ILLNESS: Alexis Farmer a 80y.o. female previously followed by [facility] for bilateral lower extremity claudication, right extremity asymptomatic arterial disease, and carotid artery stenosis.  She is asymptomatic from all of these.  She was counseled to [place] easier for her caretakers.  Reports she can walk without much difficulty.  She has no symptoms typical of ischemic rest pain.  She has no ischemic ulceration.  She denies any symptoms of stroke or TIA.  He has no difficulty using the right upper extremity.  She denies vertebrobasilar symptoms.  Past Medical History:  Diagnosis Date  . Arthritis   . Chickenpox   . [place]  [place]    one elevated reading/ no treatment  . Diverticulitis   . GI bleed   . High cholesterol   . History of blood transfusion   . Hypertension   . Renal insufficiency     Past Surgical History:  Procedure Laterality Date  . APPENDECTOMY    . CHOLECYSTECTOMY    . ECTOPIC PREGNANCY SURGERY    . EYE SURGERY     bilateral cataracts  . EYE SURGERY     02/11/2019 repair hole in right eye   . gallbladder     . HYSTEROSCOPY WITH D & C N/A 10/26/2016   Procedure: DILATATION AND CURETTAGE /HYSTEROSCOPY;  Surgeon: BBenjaman Kindler MD;  Location: [facility] ORS;  Service: Gynecology;  Laterality: N/A;  . HYSTEROSCOPY WITH D & C N/A 07/07/2018   Procedure: DILATATION AND CURETTAGE /HYSTEROSCOPY;  Surgeon: BBenjaman Kindler MD;  Location: [facility] OR[facility];  Service: Gynecology;  Laterality: N/A;  . THYROIDECTOMY, PARTIAL      Family History  Problem Relation Age of Onset  . Diabetes Mother   . Hypertension Mother   . Stroke Mother   . Diabetes Other   . Healthy Father   . Diabetes Sister   . Heart disease Sister     Social History   Socioeconomic History  . Marital status: Widowed    Spouse name: Not on file  . Number of children: 1  . Years of education: 149 . Highest education level: 12th grade  Occupational History  . Occupation: retired    Comment: hx of sPharmacist, hospital housekeeper, [place]Social [place]  Tobacco Use  . Smoking status: Never Smoker  . Smokeless tobacco: Never  Used  Vaping Use  . Vaping Use: Never used  Substance and Sexual Activity  . Alcohol use: No  . Drug use: No  . Sexual activity: Not Currently  Other Topics Concern  . 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 Difficulty of Paying Living Expenses: Not hard at all  Food Insecurity: No Food Insecurity  . Worried About Charity fundraiser in the Last Year: Never true  . Ran Out of Food in the Last Year: Never true  Transportation Needs: No Transportation Needs  . Lack of Transportation (Medical): No  . Lack of Transportation (Non-Medical): No  Physical Activity: Inactive  . Days of Exercise per Week: 0 days  . Minutes of Exercise per Session: 0 min  Stress: Stress Concern Present  . Feeling of Stress : To some extent  Social Connections: Socially Isolated  . Frequency of Communication with Friends and Family: More than three times a week  . Frequency of Social Gatherings with Friends and Family: More than three times a week  . Attends Religious Services: Never  . Active Member of Clubs or Organizations: No  . Attends Archivist Meetings: Never  . Marital Status: Widowed  Intimate Partner Violence: Not At Risk  . Fear of Current or Ex-Partner: No  . Emotionally Abused: No  . Physically Abused: No  . Sexually Abused: No    Allergies  Allergen Reactions  . Jardiance [Empagliflozin]   . Penicillin V     Other reaction(s): rash  . Penicillins Rash    Has patient had a PCN reaction causing immediate rash, facial/tongue/throat swelling, SOB or lightheadedness with hypotension: Yes Has patient had a PCN reaction causing severe rash involving mucus membranes or skin necrosis: No Has patient had a PCN reaction that required hospitalization No Has patient had a PCN reaction occurring within the last 10 years: Yes If all of the above answers are "NO", then may proceed with Cephalosporin use.     Current Outpatient Medications  Medication Sig Dispense Refill  . acetaminophen (TYLENOL) 500 MG tablet Take 500 mg by mouth every 6 (six) hours as needed.     Marland Kitchen amLODipine (NORVASC) 10 MG tablet Take 1 tablet (10 mg total) by mouth daily. 90 tablet 3  . Ascorbic Acid (VITAMIN C  PO) Take 500 mg by mouth daily.    . blood glucose meter kit and supplies KIT Accu chek, Dx code E11.65, check 3 times daily 1 each 0  . cloNIDine (CATAPRES) 0.2 MG tablet Take 1 tablet (0.2 mg total) by mouth 3 (three) times daily. 270 tablet 3  . glucose blood test strip Use as instructed tid  Accuchek Guide 300 each 12  . insulin NPH Human (NOVOLIN N) 100 UNIT/ML injection INJECT 20 UNITS IN THE MORNING AND 20 UNITS IN THE EVENING WITH A MEAL (pt actually taking 20 qam and 15 units qpm) 1200 mL 12  . insulin regular (NOVOLIN R) 100 units/mL injection 15 minutes before meals tid. 131-180 2 units 181 to 240 4 units 241-300 6units 301-350 8 units 351-400 10 units>400 12 units.  Keep glucose tablets for sugar < 70 150 mL 11  . Insulin Syringe-Needle  U-100 (B-D INS SYR ULTRAFINE .5CC/30G) 30G X 1/2" 0.5 ML MISC 1 Device by Does not apply route daily. Up to 5x per day with insulin bid (NPH) and tid (Regular) 500 each 12  . meclizine (ANTIVERT) 12.5 MG tablet Take 1 tablet (12.5 mg total) by mouth daily as needed for dizziness. 90 tablet 2  . Multiple Vitamin (MULTIVITAMIN WITH MINERALS) TABS tablet Take 1 tablet by mouth daily.    . mupirocin ointment (BACTROBAN) 2 % Apply 1 application topically 2 (two) times daily. Right index finger and left forearm 30 g 2  . polyethylene glycol powder (GLYCOLAX/MIRALAX) 17 GM/SCOOP powder Take 17 g by mouth daily as needed for moderate constipation or severe constipation. Can take up to 2x per day prn. Mix with 8 ounces of liquid 3350 g 11  . rosuvastatin (CRESTOR) 20 MG tablet TAKE 1 TABLET (20 MG TOTAL) BY MOUTH DAILY. 90 tablet 1  . spironolactone (ALDACTONE) 25 MG tablet Take 1 tablet (25 mg total) by mouth daily. 90 tablet 3  . torsemide (DEMADEX) 20 MG tablet Take 2 tablets (40 mg) twice daily for 5 days, then reduce to 1 tablet (20 mg) daily 90 tablet 1   No current facility-administered medications for this visit.    REVIEW OF SYSTEMS:  _0  denotes  positive finding, _1  denotes negative finding Cardiac  Comments:  Chest pain or chest pressure:    Shortness of breath upon exertion:    Short of breath when lying flat:    Irregular heart rhythm:        Vascular    Pain in calf, thigh, or hip brought on by ambulation:    Pain in feet at night that wakes you up from your sleep:     Blood clot in your veins:    Leg swelling:         Pulmonary    Oxygen at home:    Productive cough:     Wheezing:         Neurologic    Sudden weakness in arms or legs:     Sudden numbness in arms or legs:     Sudden onset of difficulty speaking or slurred speech:    Temporary loss of vision in one eye:     Problems with dizziness:         Gastrointestinal    Blood in stool:     Vomited blood:         Genitourinary    Burning when urinating:     Blood in urine:        Psychiatric    Major depression:         Hematologic    Bleeding problems:    Problems with blood clotting too easily:        Skin    Rashes or ulcers:        Constitutional    Fever or chills:      PHYSICAL EXAM  Vitals:   10/04/20 1032  BP: (!) 172/70  Pulse: 69  Resp: 16  Temp: 97.6 F (36.4 C)  TempSrc: Temporal  SpO2: 95%  Weight: 259 lb (117.5 kg)  Height: _2  (1.6 m)    Constitutional: Elderly. No distress. Morbidly obese.  Neurologic: CN intact. No focal findings. No sensory loss. Psychiatric: Mood and affect symmetric and appropriate. Eyes: No icterus. No conjunctival pallor. Ears, nose, throat: mucous membranes moist. Midline trachea.  Cardiac: regular rate and rhythm.  Respiratory: unlabored. Abdominal: soft,  non-tender, non-distended.  Peripheral vascular:  Absent pedal pulses  2+ L radial pulse   Absent R radial pulse Extremity: No edema. No cyanosis. No pallor.  Skin: No gangrene. No ulceration.  Lymphatic: No Stemmer's sign. No palpable lymphadenopathy.  PERTINENT LABORATORY AND RADIOLOGIC DATA  Most recent CBC CBC Latest Ref  Rng & Units 08/13/2020 03/23/2020 10/24/2019  WBC 4.0 - 10.5 K/uL 7.6 7.7 9.5  Hemoglobin 12.0 - 15.0 g/dL 13.9 13.9 14.3  Hematocrit 36.0 - 46.0 % 43.5 42.3 45.4  Platelets 150 - 400 K/uL 225 206.0 254     Most recent CMP CMP Latest Ref Rng & Units 08/13/2020 03/23/2020 10/24/2019  Glucose 70 - 99 mg/dL 291(H) 175(H) 326(H)  BUN 8 - 23 mg/dL _0 Creatinine 0.44 - 1.00 mg/dL 1.17(H) 1.07 1.08(H)  Sodium 135 - 145 mmol/L 136 140 136  Potassium 3.5 - 5.1 mmol/L 4.1 4.2 3.8  Chloride 98 - 111 mmol/L 101 101 99  CO2 22 - 32 mmol/L 24 32 25  Calcium 8.9 - 10.3 mg/dL 9.4 10.1 9.7  Total Protein 6.5 - 8.1 g/dL - - 7.2  Total Bilirubin 0.3 - 1.2 mg/dL - - 1.4(H)  Alkaline Phos 38 - 126 U/L - - 43  AST 15 - 41 U/L - - 37  ALT 0 - 44 U/L - - 34    Renal function CrCl cannot be calculated (Patient's most recent lab result is older than the maximum 21 days allowed.).  Hemoglobin A1C (%)  Date Value  08/25/2020 9.3 (A)   Hgb A1c MFr Bld (%)  Date Value  03/23/2020 10.0 Repeated and verified X2. (H)    LDL Cholesterol  Date Value Ref Range Status  03/23/2020 71 0 - 99 mg/dL Final   Direct LDL  Date Value Ref Range Status  10/28/2018 96.0 mg/dL Final    Comment:    Optimal:  <100 mg/dLNear or Above Optimal:  100-129 mg/dLBorderline High:  130-159 mg/dLHigh:  160-189 mg/dLVery High:  >190 mg/dL     Vascular Imaging: ABI 10/04/20   Carotid Duplex 10/04/20 1-39% Bilateral ICAs (R PSV 189)  Dean Goldner N. Stanford Breed, MD [facility] Phone Number: [phone] 10/04/2020 12:20 PM

## 2020-10-05 ENCOUNTER — Telehealth: Payer: Self-pay

## 2020-10-05 NOTE — Telephone Encounter (Signed)
Pt called with question she thought of after seeing MD in office yesterday. He asked her if she has arm pain and she occasionally for months has noticed a "shock" type feeling in R arm that goes from shoulder down and it will tingle/feel numb and is relieved when she rubs it. Per APP, this sounds more nerve related. Pt is aware and denies any pain/weakness in the arm. She verbalized understanding and will call us back if she experiences any pain/weakness or if she has further questions/concerns.

## 2020-10-17 ENCOUNTER — Other Ambulatory Visit: Payer: Self-pay

## 2020-10-17 ENCOUNTER — Ambulatory Visit (INDEPENDENT_AMBULATORY_CARE_PROVIDER_SITE_OTHER): Payer: Medicare Other | Admitting: Endocrinology

## 2020-10-17 VITALS — BP 110/60 | HR 83 | Ht 63.0 in | Wt 266.0 lb

## 2020-10-17 DIAGNOSIS — N1832 Chronic kidney disease, stage 3b: Secondary | ICD-10-CM | POA: Diagnosis not present

## 2020-10-17 DIAGNOSIS — IMO0002 Reserved for concepts with insufficient information to code with codable children: Secondary | ICD-10-CM

## 2020-10-17 DIAGNOSIS — I6523 Occlusion and stenosis of bilateral carotid arteries: Secondary | ICD-10-CM | POA: Diagnosis not present

## 2020-10-17 DIAGNOSIS — E1165 Type 2 diabetes mellitus with hyperglycemia: Secondary | ICD-10-CM

## 2020-10-17 DIAGNOSIS — N183 Chronic kidney disease, stage 3 unspecified: Secondary | ICD-10-CM

## 2020-10-17 DIAGNOSIS — E139 Other specified diabetes mellitus without complications: Secondary | ICD-10-CM

## 2020-10-17 DIAGNOSIS — E1122 Type 2 diabetes mellitus with diabetic chronic kidney disease: Secondary | ICD-10-CM

## 2020-10-17 DIAGNOSIS — Z794 Long term (current) use of insulin: Secondary | ICD-10-CM

## 2020-10-17 LAB — POCT GLYCOSYLATED HEMOGLOBIN (HGB A1C): Hemoglobin A1C: 9.8 % — AB (ref 4.0–5.6)

## 2020-10-17 MED ORDER — INSULIN NPH (HUMAN) (ISOPHANE) 100 UNIT/ML ~~LOC~~ SUSP: SUBCUTANEOUS | 11 refills | Status: DC

## 2020-10-17 MED ORDER — INSULIN REGULAR HUMAN 100 UNIT/ML IJ SOLN: 0.0000 [IU] | Freq: Three times a day (TID) | INTRAMUSCULAR | 11 refills | Status: DC

## 2020-10-17 NOTE — Progress Notes (Signed)
Subjective:    Patient ID: Alexis Farmer, female    DOB: 06-06-1941, 80 y.o.   MRN: [id number]  HPI pt is referred by Dr Mannie Stabile, for diabetes.  Pt states DM was dx'ed in 7680; it is complicated by DN; she has been on insulin since 2002; pt says her diet is good, but exercise is limited by health probs; she has never had GDM, pancreatitis, pancreatic surgery, severe hypoglycemia or DKA.  Pt says she is very discouraged about having diabetes.  She says she cannot afford brand name insulin.  She takes NPH, 20 units BID, and PRN Regular, 0-8 units 3 times a day (just before each meal).  Pt says cbg varies from 69-400.  It is in general higher as the day goes on.  Pt says she wants to make just small insulin adjustments at a time. Past Medical History:  Diagnosis Date  . Arthritis   . Chickenpox   . Diabetes mellitus without complication ([place])    one elevated reading/ no treatment  . Diverticulitis   . GI bleed   . High cholesterol   . History of blood transfusion   . Hypertension   . Renal insufficiency     Past Surgical History:  Procedure Laterality Date  . APPENDECTOMY    . CHOLECYSTECTOMY    . ECTOPIC PREGNANCY SURGERY    . EYE SURGERY     bilateral cataracts  . EYE SURGERY     02/11/2019 repair hole in right eye   . gallbladder     . HYSTEROSCOPY WITH D & C N/A 10/26/2016   Procedure: DILATATION AND CURETTAGE /HYSTEROSCOPY;  Surgeon: Benjaman Kindler, MD;  Location: [facility] ORS;  Service: Gynecology;  Laterality: N/A;  . HYSTEROSCOPY WITH D & C N/A 07/07/2018   Procedure: DILATATION AND CURETTAGE /HYSTEROSCOPY;  Surgeon: Benjaman Kindler, MD;  Location: [facility] ORS;  Service: Gynecology;  Laterality: N/A;  . THYROIDECTOMY, PARTIAL      Social History   Socioeconomic History  . Marital status: Widowed    Spouse name: Not on file  . Number of children: 1  . Years of education: 67  . Highest education level: 12th grade  Occupational History  . 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 Smoking status: Never Smoker  . Smokeless tobacco: Never Used  Vaping Use  . Vaping Use: Never used  Substance and Sexual Activity  . Alcohol use: No  . Drug use: No  . Sexual activity: Not Currently  Other Topics Concern  . 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 Difficulty of Paying Living Expenses: Not hard at all  Food Insecurity: No Food Insecurity  . Worried About Charity fundraiser in the Last Year: Never true  . Ran Out of Food in the Last Year: Never true  Transportation Needs: No Transportation Needs  . Lack of Transportation (Medical): No  . Lack of Transportation (Non-Medical): No  Physical Activity: Inactive  . Days of Exercise per Week: 0 days  . Minutes of Exercise per Session: 0 min  Stress: Stress Concern Present  .  Feeling of Stress : To some extent  Social Connections: Socially Isolated  . Frequency of Communication with Friends and Family: More than three times a week  . Frequency of Social Gatherings with Friends and Family: More than three times a week  . Attends Religious Services: Never  . Active Member of Clubs or Organizations: No  . Attends Archivist Meetings: Never  . Marital Status: Widowed  Intimate Partner Violence: Not At Risk  . Fear of Current or Ex-Partner: No  . Emotionally Abused: No  . Physically Abused: No  . Sexually Abused: No    Current Outpatient Medications on File Prior to Visit  Medication Sig Dispense Refill  . acetaminophen (TYLENOL) 500 MG tablet Take 500 mg by mouth every 6 (six) hours as needed.     Marland Kitchen amLODipine (NORVASC) 10 MG tablet Take 1 tablet  (10 mg total) by mouth daily. 90 tablet 3  . Ascorbic Acid (VITAMIN C PO) Take 500 mg by mouth daily.    . blood glucose meter kit and supplies KIT Accu chek, Dx code E11.65, check 3 times daily 1 each 0  . cloNIDine (CATAPRES) 0.2 MG tablet Take 1 tablet (0.2 mg total) by mouth 3 (three) times daily. 270 tablet 3  . glucose blood test strip Use as instructed tid  Accuchek Guide 300 each 12  . Insulin Syringe-Needle U-100 (B-D INS SYR ULTRAFINE .5CC/30G) 30G X 1/2" 0.5 ML MISC 1 Device by Does not apply route daily. Up to 5x per day with insulin bid (NPH) and tid (Regular) 500 each 12  . meclizine (ANTIVERT) 12.5 MG tablet Take 1 tablet (12.5 mg total) by mouth daily as needed for dizziness. 90 tablet 2  . Multiple Vitamin (MULTIVITAMIN WITH MINERALS) TABS tablet Take 1 tablet by mouth daily.    . mupirocin ointment (BACTROBAN) 2 % Apply 1 application topically 2 (two) times daily. Right index finger and left forearm 30 g 2  . polyethylene glycol powder (GLYCOLAX/MIRALAX) 17 GM/SCOOP powder Take 17 g by mouth daily as needed for moderate constipation or severe constipation. Can take up to 2x per day prn. Mix with 8 ounces of liquid 3350 g 11  . rosuvastatin (CRESTOR) 20 MG tablet TAKE 1 TABLET (20 MG TOTAL) BY MOUTH DAILY. 90 tablet 1  . spironolactone (ALDACTONE) 25 MG tablet Take 1 tablet (25 mg total) by mouth daily. 90 tablet 3  . torsemide (DEMADEX) 20 MG tablet Take 2 tablets (40 mg) twice daily for 5 days, then reduce to 1 tablet (20 mg) daily 90 tablet 1   No current facility-administered medications on file prior to visit.    Family History  Problem Relation Age of Onset  . Diabetes Mother   . Hypertension Mother   . Stroke Mother   . Diabetes Other   . Healthy Father   . Diabetes Sister   . Heart disease Sister     BP 110/60 (BP Location: Right Arm, Patient Position: Sitting, Cuff Size: Large)   Pulse 83   Ht '5\' 3"'  (1.6 m)   Wt 266 lb (120.7 kg)   SpO2 96%   BMI 47.12 kg/m    Review of Systems denies sob, n/v, urinary frequency, memory loss, and depression. She has weight gain.     Objective:   Physical Exam VITAL SIGNS:  See vs page GENERAL: no distress.  In wheelchair.  Pulses: dorsalis pedis absent bilat (poss due to edema) MSK: no deformity of the feet CV: 3+ bilat  leg edema Skin:  no ulcer on the feet.  normal color and temp on the feet. Neuro: sensation is intact to touch on the feet  Lab Results  Component Value Date   HGBA1C 9.8 (A) 10/17/2020   Ca++=11.2  I have reviewed outside records, and summarized: Pt was noted to have elevated A1c, and referred here.  HTN and wellness were also addressed.      Assessment & Plan:  Insulin-requiring type 2 DM: uncontrolled.  She may need to go to QD or BID insulin, as she is very discouraged.  Hypercalcemia: we'll address at next ov.  Patient Instructions  good diet and exercise significantly improve the control of your diabetes.  please let me know if you wish to be referred to a dietician.  high blood sugar is very risky to your health.  you should see an eye doctor and dentist every year.  It is very important to get all recommended vaccinations.  Controlling your blood pressure and cholesterol drastically reduces the damage diabetes does to your body.  Those who smoke should quit.  Please discuss these with your doctor.  check your blood sugar 4 times a day: before the 3 meals, and at bedtime.  also check if you have symptoms of your blood sugar being too high or too low.  please keep a record of the readings and bring it to your next appointment here (or you can bring the meter itself).  You can write it on any piece of paper.  please call us sooner if your blood sugar goes below 70, or if most of your readings are over 200.   Please change the NPH to 25 units each morning and 15 units each evening.   Please continue the same Reg insulin.  Please come back for a follow-up appointment in 2 months.

## 2020-10-17 NOTE — Patient Instructions (Addendum)
good diet and exercise significantly improve the control of your diabetes.  please let me know if you wish to be referred to a dietician.  high blood sugar is very risky to your health.  you should see an eye doctor and dentist every year.  It is very important to get all recommended vaccinations.  Controlling your blood pressure and cholesterol drastically reduces the damage diabetes does to your body.  Those who smoke should quit.  Please discuss these with your doctor.  check your blood sugar 4 times a day: before the 3 meals, and at bedtime.  also check if you have symptoms of your blood sugar being too high or too low.  please keep a record of the readings and bring it to your next appointment here (or you can bring the meter itself).  You can write it on any piece of paper.  please call us sooner if your blood sugar goes below 70, or if most of your readings are over 200.   Please change the NPH to 25 units each morning and 15 units each evening.   Please continue the same Reg insulin.  Please come back for a follow-up appointment in 2 months.

## 2020-10-19 ENCOUNTER — Ambulatory Visit (INDEPENDENT_AMBULATORY_CARE_PROVIDER_SITE_OTHER): Payer: Medicare Other | Admitting: *Deleted

## 2020-10-19 DIAGNOSIS — E1129 Type 2 diabetes mellitus with other diabetic kidney complication: Secondary | ICD-10-CM

## 2020-10-19 DIAGNOSIS — I5032 Chronic diastolic (congestive) heart failure: Secondary | ICD-10-CM | POA: Diagnosis not present

## 2020-10-19 NOTE — Patient Instructions (Signed)
Visit Information  PATIENT GOALS: Goals Addressed            This Visit's Progress   . (RNCM) Monitor and Manage My Blood Sugar   Not on track    Timeframe:  Long-Range Goal Priority:  High Start Date:    09/28/2020                         Expected End Date:  03/17/21                      Follow Up Date 11/09/20   . Check blood sugar at least 4 times a day . Check blood sugar if I feel it is too high or too low . Enter blood sugar readings and medication/insulin into daily log . Take the blood sugar log to all doctor visits  . Discuss goal A1C with endocrinology  . Take insulins as prescribed (25 Units in am and 15 Units in pm)   Why is this important?   Checking your blood sugar at home helps to keep it from getting very high or very low.  Writing the results in a diary or log helps the doctor know how to care for you.  Your blood sugar log should have the time, date and the results.  Also, write down the amount of insulin or other medicine that you take.  Other information, like what you ate, exercise done and how you were feeling, will also be helpful.     Notes:     Marland Kitchen (RNCM) Track and Manage Fluids and Swelling-Heart Failure   Not on track    Timeframe:  Long-Range Goal Priority:  Medium Start Date:   09/28/20                          Expected End Date:  03/17/21                     Follow Up Date 11/09/20    . Please consider obtaining scale for home (large print or verbally read out numbers) . Call office if I gain more than 2 pounds in one day or 5 pounds in one week . Use salt in moderation . Watch for swelling in feet, ankles and legs every day . Weigh myself daily and write weights in log . Take log to all provider appointments   Why is this important?    It is important to check your weight daily and watch how much salt and liquids you have.   It will help you to manage your heart failure.    Notes:        The patient verbalized understanding of  instructions, educational materials, and care plan provided today and declined offer to receive copy of patient instructions, educational materials, and care plan.   The care management team will reach out to the patient again over the next 30 business days.   [name] RN, MSN RN Care Management Coordinator [place] [phone] [email or web address]

## 2020-10-19 NOTE — Chronic Care Management (AMB) (Signed)
Chronic Care Management   CCM RN Visit Note  10/19/2020 Name: Alexis Farmer MRN: [id number] DOB: 1940-08-12  Subjective: Alexis Farmer is a 80 y.o. year old female who is a primary care patient of McLean-Scocuzza, Nino Glow, MD. The care management team was consulted for assistance with disease management and care coordination needs.    Engaged with patient by telephone for follow up visit in response to provider referral for case management and/or care coordination services.   Consent to Services:  The patient was given information about Chronic Care Management services, agreed to services, and gave verbal consent prior to initiation of services.  Please see initial visit note for detailed documentation.   Patient agreed to services and verbal consent obtained.   Assessment: Review of patient past medical history, allergies, medications, health status, including review of consultants reports, laboratory and other test data, was performed as part of comprehensive evaluation and provision of chronic care management services.   SDOH (Social Determinants of Health) assessments and interventions performed:    CCM Care Plan  Allergies  Allergen Reactions  . Jardiance [Empagliflozin]   . Penicillin V     Other reaction(s): rash  . Penicillins Rash    Has patient had a PCN reaction causing immediate rash, facial/tongue/throat swelling, SOB or lightheadedness with hypotension: Yes Has patient had a PCN reaction causing severe rash involving mucus membranes or skin necrosis: No Has patient had a PCN reaction that required hospitalization No Has patient had a PCN reaction occurring within the last 10 years: Yes If all of the above answers are "NO", then may proceed with Cephalosporin use.     Outpatient Encounter Medications as of 10/19/2020  Medication Sig  . insulin NPH Human (NOVOLIN N) 100 UNIT/ML injection 25 units each morning, and 15 units each evening.  . insulin regular  (NOVOLIN R) 100 units/mL injection Inject 0-0.08 mLs (0-8 Units total) into the skin 3 (three) times daily before meals.  Marland Kitchen spironolactone (ALDACTONE) 25 MG tablet Take 1 tablet (25 mg total) by mouth daily.  Marland Kitchen torsemide (DEMADEX) 20 MG tablet Take 2 tablets (40 mg) twice daily for 5 days, then reduce to 1 tablet (20 mg) daily  . acetaminophen (TYLENOL) 500 MG tablet Take 500 mg by mouth every 6 (six) hours as needed.   Marland Kitchen amLODipine (NORVASC) 10 MG tablet Take 1 tablet (10 mg total) by mouth daily.  . Ascorbic Acid (VITAMIN C PO) Take 500 mg by mouth daily.  . blood glucose meter kit and supplies KIT Accu chek, Dx code E11.65, check 3 times daily  . cloNIDine (CATAPRES) 0.2 MG tablet Take 1 tablet (0.2 mg total) by mouth 3 (three) times daily.  Marland Kitchen glucose blood test strip Use as instructed tid  Accuchek Guide  . Insulin Syringe-Needle U-100 (B-D INS SYR ULTRAFINE .5CC/30G) 30G X 1/2" 0.5 ML MISC 1 Device by Does not apply route daily. Up to 5x per day with insulin bid (NPH) and tid (Regular)  . meclizine (ANTIVERT) 12.5 MG tablet Take 1 tablet (12.5 mg total) by mouth daily as needed for dizziness.  . Multiple Vitamin (MULTIVITAMIN WITH MINERALS) TABS tablet Take 1 tablet by mouth daily.  . mupirocin ointment (BACTROBAN) 2 % Apply 1 application topically 2 (two) times daily. Right index finger and left forearm  . polyethylene glycol powder (GLYCOLAX/MIRALAX) 17 GM/SCOOP powder Take 17 g by mouth daily as needed for moderate constipation or severe constipation. Can take up to 2x per day prn. Mix with 8  ounces of liquid  . rosuvastatin (CRESTOR) 20 MG tablet TAKE 1 TABLET (20 MG TOTAL) BY MOUTH DAILY.   No facility-administered encounter medications on file as of 10/19/2020.    Patient Active Problem List   Diagnosis Date Noted  . Other chronic pain 08/25/2020  . Hypertension associated with diabetes ([place]) 08/25/2020  . 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  . Pure hypercholesterolemia  08/10/2020  . 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 Retinopathy 05/30/2020  . Primary hypertension 03/25/2020  . Multinodular goiter 02/04/2020  . Peripheral edema 12/22/2019  . Proteinuria 12/22/2019  . Constipation 11/17/2019  . Recurrent falls 08/13/2019  . Physical deconditioning 08/13/2019  . Abdominal aortic atherosclerosis ([place]) 07/24/2019  . Morbid obesity with BMI of 45.0-49.9, adult ([place]) 06/09/2019  . Hypercalcemia 03/18/2019  . Round hole, unspecified eye 02/06/2019  . 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 Disorder of rotator cuff 06/24/2018  . Abnormal gait 03/26/2018  . Fall 03/26/2018  . Arthritis 03/26/2018  . Thyroid nodule 01/20/2018  . Dysuria 03/22/2017  . Cervical spondylosis with radiculopathy 01/26/2017  . Rectal hemorrhage 07/29/2016  . Carotid artery stenosis 07/19/2016  . PAD (peripheral artery disease) ([place]) 07/19/2016  . Swelling of lower leg 07/19/2016  . Bradycardia 07/17/2016  . Postmenopausal bleeding 07/17/2016  . Chronic diastolic heart failure ([place]) 05/31/2016  . Cough 01/25/2016  . Chronic fatigue 11/15/2015  . Depression, recurrent ([place]) 10/13/2015  . Osteoarthritis 10/13/2015  . Vertigo 10/13/2015  . Anxiety 09/13/2015  . Gastrointestinal hemorrhage 08/03/2015  . Hyperglycemia due to type 2 diabetes mellitus ([place]) 03/11/2015  . Mixed hyperlipidemia 08/26/2014  . Essential hypertension 04/08/2014  . Diabetes mellitus type II, uncontrolled ([place]) 04/08/2014  . Chronic kidney disease, stage 2 (mild) 04/08/2014  . Edema of foot 04/08/2014  . Morbid obesity ([place]) 04/08/2014  . Low back pain 04/08/2014  . Diabetic polyneuropathy ([place]) 04/08/2014  . Type 2 diabetes mellitus with other diabetic kidney complication ([place]) 56/25/6389    Conditions to be addressed/monitored:CHF and DMII  Care Plan : Diabetes Type 2 (Adult)  Updates made by Leona Singleton, RN since 10/19/2020 12:00 AM  Problem: Glycemic Management (Diabetes, Type 2)   Priority: High  Long-Range Goal: Patient will verbalize decrease in Hgb A1C by 0.3 points within the next 90 days   Start Date: 09/28/2020  Expected End Date: 03/17/2021  This Visit's Progress: Not on track  Recent Progress: Not on track  Priority: High  Objective:  Lab Results  Component Value Date   HGBA1C 9.8 (A) 10/17/2020  Current Barriers:  Marland Kitchen Knowledge Deficits related to basic Diabetes pathophysiology and self care/management as evidenced by continued elevated blood sugars and Hgb A1C increased to 9.8.  Fasting blood sugar this morning was 279 (reporting this is higher than normal due to insulin adjustments made by Endocrinologist). Patient stating her blood sugars have varied from 69-400's pending what she has eaten and trying to correct hypoglycemia .  Does report hypoglycemia in the mornings at times with fasting sugars of 60's and sometimes being too symptomatic (shaky) to check blood sugar at the time, instead treating the hypoglycemia with juice or candy to stop shaking.  Acknowledges she does not eat correct at times to manage her diabetes.  Reports being compliant with all her medications and insulins.   . Lack of self motivation to self management of diabetes Case Manager Clinical Goal(s):  . patient will demonstrate improved adherence to prescribed  treatment plan for diabetes self care/management as evidenced by: at least 4 times a day monitoring and recording of CBG,  adherence to ADA/ carb modified diet, adherence to prescribed medication regimen, contacting provider for new or worsened symptoms or questions Interventions:  . Collaboration with McLean-Scocuzza, Nino Glow, MD regarding development and update of comprehensive plan of care as evidenced by provider attestation and co-signature . Inter-disciplinary care team collaboration (see longitudinal plan of care) . Provided education to patient  about basic DM disease process . Reviewed medications with patient, insulin dose adjustments and discussed importance of medication adherence . Discussed plans with patient for ongoing care management follow up and provided patient with direct contact information for care management team . Provided patient with verbal educational materials related to hypo and hyperglycemia and importance of correct treatment . Reviewed scheduled/upcoming provider appointments including:  eye exam on 11/15/20 . Advised patient, providing education and rationale, to check cbg at least 4 times a day and record, calling primary care provider or endocrinologist after consultation for findings outside established parameters.   . Sending Diabetes Education . Discussed and offered to arrange Diabetes Education and Nutrition Class (declines at this time) . Discussed diabetic carbohydrate modified diet and foods to limit or avoid; Provided medical nutrition therapy and development of individualized eating . Discussed current Hgb A1C and what is normal and what is patient's goal; per patient her goal is below 10-discussed and encouraged patient to review goal with provider and to consider changing goal to lower  Promoted self-monitoring of blood glucose levels.   Assessed and addressed barriers to management plan, such as food insecurity, age, developmental ability, depression, anxiety, fear of hypoglycemia or weight gain, as well as medication cost, side effects and complicated regimen.  Patient Goals/Self-Care Activities: . Check blood sugar at least 4 times a day . Check blood sugar if I feel it is too high or too low . Enter blood sugar readings and medication/insulin into daily log . Take the blood sugar log to all doctor visits  . Discuss goal A1C with endocrinology  . Take insulins as prescribed (25 Units in am and 15 Units in pm) Follow Up Plan: The care management team will reach out to the patient again over the  next 30 business days.     Care Plan : Heart Failure (Adult)  Updates made by Leona Singleton, RN since 10/19/2020 12:00 AM  Problem: Symptom Exacerbation (Heart Failure)   Priority: Medium  Long-Range Goal: Patient will report obtaining scale for home within the next 90 days   Start Date: 09/28/2020  Expected End Date: 03/17/2021  This Visit's Progress: Not on track  Priority: Medium  Current Barriers:  Marland Kitchen Knowledge deficit related to basic heart failure pathophysiology and self care management as evidenced by not knowing heart failure diagnosis and treatment/self care management.  Reports not weighing daily.  Continues to report not having scale at home but will consider obtaining one.  Does report compliance with fluid medications.  Reports lower extremity edema is about the same as always.  Last BP check at medical appointment was 110/60 and patient happy about that reading. . Knowledge Deficits related to heart failure medications Case Manager Clinical Goal(s):  . patient will obtain scale to weigh self daily and record . patient will verbalize understanding of Heart Failure Action Plan and when to call doctor . patient will take all Heart Failure mediations as prescribed . patient will weigh daily and record (notifying MD of 3  lb weight gain over night or 5 lb in a week) Interventions:  . Collaboration with McLean-Scocuzza, Nino Glow, MD regarding development and update of comprehensive plan of care as evidenced by provider attestation and co-signature . Inter-disciplinary care team collaboration (see longitudinal plan of care) . Basic overview and discussion of pathophysiology of Heart Failure reviewed  . Provided verbal education on low sodium diet . Assessed need for readable accurate scales in home and encouraged patient to obtain scale with large print or verbally read out weight to patient . Provided education about placing scale on hard, flat surface . Advised patient to weigh each  morning after emptying bladder . Discussed importance of daily weight and advised patient to weigh and record daily . Reviewed role of diuretics in prevention of fluid overload and management of heart failure . Barriers to lifestyle changes reviewed and addressed; barriers to treatment reviewed and addressed . Healthy lifestyle promoted . Self-awareness of signs/symptoms of worsening disease encouraged . Assessed understanding of adherence and barriers to treatment plan, as well as lifestyle changes; discussed strategies to address barriers Patient Goals/Self-Care Activities: . Please consider obtaining scale for home (large print or verbally read out numbers) . Call office if I gain more than 2 pounds in one day or 5 pounds in one week . Use salt in moderation . Watch for swelling in feet, ankles and legs every day . Weigh myself daily and write weights in log . Take log to all provider appointments Follow Up Plan: The care management team will reach out to the patient again over the next 30 business days.      Plan:The care management team will reach out to the patient again over the next 30 business days.  [name] RN, MSN RN Care Management Coordinator [place] [phone] [email or web address]

## 2020-10-28 ENCOUNTER — Ambulatory Visit (INDEPENDENT_AMBULATORY_CARE_PROVIDER_SITE_OTHER): Payer: Medicare Other

## 2020-10-28 ENCOUNTER — Other Ambulatory Visit: Payer: Self-pay

## 2020-10-28 DIAGNOSIS — E1142 Type 2 diabetes mellitus with diabetic polyneuropathy: Secondary | ICD-10-CM

## 2020-11-02 ENCOUNTER — Telehealth: Payer: Self-pay | Admitting: Internal Medicine

## 2020-11-02 NOTE — Telephone Encounter (Signed)
Faxed to [facility] prescriber response form on 11-01-20 to [phone]

## 2020-11-09 ENCOUNTER — Telehealth: Payer: Self-pay | Admitting: Internal Medicine

## 2020-11-09 ENCOUNTER — Ambulatory Visit: Payer: Medicare Other | Admitting: *Deleted

## 2020-11-09 DIAGNOSIS — E1129 Type 2 diabetes mellitus with other diabetic kidney complication: Secondary | ICD-10-CM

## 2020-11-09 DIAGNOSIS — I5032 Chronic diastolic (congestive) heart failure: Secondary | ICD-10-CM

## 2020-11-09 NOTE — Chronic Care Management (AMB) (Signed)
  Care Management   Follow Up Note   11/09/2020 Name: Alexis Farmer MRN: [id number] DOB: 01/23/1941   Referred by: McLean-Scocuzza, Nino Glow, MD Reason for referral : Chronic Care Management (DM, CHF)   Successful telephone outreach to patient for CCM follow up.  Patient stating she has cold and does not feel well, requesting telephone call back another day and time.  Follow Up Plan: The care management team will reach out to the patient again over the next 20 business days per patient request.   [name] RN, MSN RN Care Management Coordinator [place] [phone] [email or web address]

## 2020-11-09 NOTE — Telephone Encounter (Signed)
I did not call patient.

## 2020-11-09 NOTE — Telephone Encounter (Signed)
Patient called in stated that she was returning call from Judson Roch

## 2020-11-10 NOTE — Telephone Encounter (Signed)
Called Patient and she states that she had gotten a call back from [place]. Patient states she is fine now and has no further questions.   When introducing myself and asking the Patient how she was, Patient states that she had a cold. States she is okay and agreed to call back for an appointment should her symptoms worsen.

## 2020-11-17 ENCOUNTER — Telehealth: Payer: Self-pay | Admitting: Podiatry

## 2020-11-17 NOTE — Telephone Encounter (Signed)
Called and left message for pt that Dr Loanne Drilling stated pt does not qualify for shoes and I wanted to let pt know I was going to send documents to pcp to see if they would sign them.

## 2020-11-18 ENCOUNTER — Emergency Department (HOSPITAL_COMMUNITY)
Admission: EM | Admit: 2020-11-18 | Discharge: 2020-11-18 | Disposition: A | Discharge status: Home / Self Care | Payer: Medicare Other | Attending: Emergency Medicine | Admitting: Emergency Medicine

## 2020-11-18 ENCOUNTER — Other Ambulatory Visit: Payer: Self-pay

## 2020-11-18 DIAGNOSIS — E11319 Type 2 diabetes mellitus with unspecified diabetic retinopathy without macular edema: Secondary | ICD-10-CM | POA: Insufficient documentation

## 2020-11-18 DIAGNOSIS — F410 Panic disorder [episodic paroxysmal anxiety] without agoraphobia: Secondary | ICD-10-CM | POA: Diagnosis not present

## 2020-11-18 DIAGNOSIS — J029 Acute pharyngitis, unspecified: Secondary | ICD-10-CM | POA: Insufficient documentation

## 2020-11-18 DIAGNOSIS — Z794 Long term (current) use of insulin: Secondary | ICD-10-CM | POA: Diagnosis not present

## 2020-11-18 DIAGNOSIS — E1142 Type 2 diabetes mellitus with diabetic polyneuropathy: Secondary | ICD-10-CM | POA: Diagnosis not present

## 2020-11-18 DIAGNOSIS — I5032 Chronic diastolic (congestive) heart failure: Secondary | ICD-10-CM | POA: Diagnosis not present

## 2020-11-18 DIAGNOSIS — N182 Chronic kidney disease, stage 2 (mild): Secondary | ICD-10-CM | POA: Diagnosis not present

## 2020-11-18 DIAGNOSIS — E890 Postprocedural hypothyroidism: Secondary | ICD-10-CM | POA: Insufficient documentation

## 2020-11-18 DIAGNOSIS — I130 Hypertensive heart and chronic kidney disease with heart failure and stage 1 through stage 4 chronic kidney disease, or unspecified chronic kidney disease: Secondary | ICD-10-CM | POA: Diagnosis not present

## 2020-11-18 DIAGNOSIS — Z79899 Other long term (current) drug therapy: Secondary | ICD-10-CM | POA: Insufficient documentation

## 2020-11-18 DIAGNOSIS — E1122 Type 2 diabetes mellitus with diabetic chronic kidney disease: Secondary | ICD-10-CM | POA: Diagnosis not present

## 2020-11-18 DIAGNOSIS — F419 Anxiety disorder, unspecified: Secondary | ICD-10-CM | POA: Diagnosis present

## 2020-11-18 NOTE — ED Triage Notes (Signed)
Brought in by [facility] from home - Throat irritation from exposed to bug spray. Lung sounds clear bilaterally. Able to speak in full sentences. No coughing.      Pt in triage and already calling family to pick her up.

## 2020-11-18 NOTE — ED Provider Notes (Signed)
[facility] EMERGENCY DEPARTMENT Provider Note   CSN: [id number] Arrival date & time: 11/18/20  0053     History Chief Complaint  Patient presents with  . Sore Throat    Alexis Farmer is a 80 y.o. female.  Patient brought to the ED by EMS for evaluation of throat irritation after direct exposure to [employer] fumes. She was spraying in her kitchen and breathed in some of the fumes. She felt her throat become irritated and became anxious. History of anxiety and panic. She called EMS but found her symptoms resolved in route. No sore or irritated throat now. Her symptoms of anxiety of resolved. No difficulty swallowing or SOB.   The history is provided by the patient. No language interpreter was used.       Past Medical History:  Diagnosis Date  . Arthritis   . Chickenpox   . Diabetes mellitus without complication ([place])    one elevated reading/ no treatment  . Diverticulitis   . GI bleed   . High cholesterol   . History of blood transfusion   . Hypertension   . Renal insufficiency     Patient Active Problem List   Diagnosis Date Noted  . Other chronic pain 08/25/2020  . Hypertension associated with diabetes ([place]) 08/25/2020  . 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  . Pure hypercholesterolemia 08/10/2020  . 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 Retinopathy 05/30/2020  . Primary hypertension 03/25/2020  . Multinodular goiter 02/04/2020  . Peripheral edema 12/22/2019  . Proteinuria 12/22/2019  . Constipation 11/17/2019  . Recurrent falls 08/13/2019  . Physical deconditioning 08/13/2019  . Abdominal aortic atherosclerosis ([place]) 07/24/2019  . Morbid obesity with BMI of 45.0-49.9, adult ([place]) 06/09/2019  . Hypercalcemia 03/18/2019  . Round hole, unspecified eye 02/06/2019  . 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 Disorder of rotator cuff 06/24/2018  .  Abnormal gait 03/26/2018  . Fall 03/26/2018  . Arthritis 03/26/2018  . Thyroid nodule 01/20/2018  . Dysuria 03/22/2017  . Cervical spondylosis with radiculopathy 01/26/2017  . Rectal hemorrhage 07/29/2016  . Carotid artery stenosis 07/19/2016  . PAD (peripheral artery disease) ([place]) 07/19/2016  . Swelling of lower leg 07/19/2016  . Bradycardia 07/17/2016  . Postmenopausal bleeding 07/17/2016  . Chronic diastolic heart failure ([place]) 05/31/2016  . Cough 01/25/2016  . Chronic fatigue 11/15/2015  . Depression, recurrent ([place]) 10/13/2015  . Osteoarthritis 10/13/2015  . Vertigo 10/13/2015  . Anxiety 09/13/2015  . Gastrointestinal hemorrhage 08/03/2015  . Hyperglycemia due to type 2 diabetes mellitus ([place]) 03/11/2015  . Mixed hyperlipidemia 08/26/2014  . Essential hypertension 04/08/2014  . Diabetes mellitus type II, uncontrolled ([place]) 04/08/2014  . Chronic kidney disease, stage 2 (mild) 04/08/2014  . Edema of foot 04/08/2014  . Morbid obesity ([place]) 04/08/2014  . Low back pain 04/08/2014  . Diabetic polyneuropathy ([place]) 04/08/2014  . Type 2 diabetes mellitus with other diabetic kidney complication ([place]) 40/98/1191    Past Surgical History:  Procedure Laterality Date  . APPENDECTOMY    . CHOLECYSTECTOMY    . ECTOPIC PREGNANCY SURGERY    . EYE SURGERY     bilateral cataracts  . EYE SURGERY     02/11/2019 repair hole in right eye   . gallbladder     . HYSTEROSCOPY WITH D & C N/A 10/26/2016   Procedure: DILATATION AND CURETTAGE /HYSTEROSCOPY;  Surgeon: Benjaman Kindler, MD;  Location: [facility] ORS;  Service: Gynecology;  Laterality: N/A;  . HYSTEROSCOPY WITH D & C N/A 07/07/2018   Procedure: DILATATION AND CURETTAGE /HYSTEROSCOPY;  Surgeon: Benjaman Kindler, MD;  Location: [facility] ORS;  Service: Gynecology;  Laterality: N/A;  . THYROIDECTOMY, PARTIAL       OB History   No obstetric history on file.     Family History  Problem Relation Age of Onset  . Diabetes Mother   .  Hypertension Mother   . Stroke Mother   . Diabetes Other   . Healthy Father   . Diabetes Sister   . Heart disease Sister     Social History   Tobacco Use  . Smoking status: Never Smoker  . Smokeless tobacco: Never Used  Vaping Use  . Vaping Use: Never used  Substance Use Topics  . Alcohol use: No  . Drug use: No    Home Medications Prior to Admission medications   Medication Sig Start Date End Date Taking? Authorizing Provider  acetaminophen (TYLENOL) 500 MG tablet Take 500 mg by mouth every 6 (six) hours as needed.     [provider]  amLODipine (NORVASC) 10 MG tablet Take 1 tablet (10 mg total) by mouth daily. 03/24/20   McLean-Scocuzza, Nino Glow, MD  Ascorbic Acid (VITAMIN C PO) Take 500 mg by mouth daily.    [provider]  blood glucose meter kit and supplies KIT Accu chek, Dx code E11.65, check 3 times daily 07/26/17   Leone Haven, MD  cloNIDine (CATAPRES) 0.2 MG tablet Take 1 tablet (0.2 mg total) by mouth 3 (three) times daily. 06/01/20   McLean-Scocuzza, Nino Glow, MD  glucose blood test strip Use as instructed tid  Accuchek Guide 06/03/20   McLean-Scocuzza, Nino Glow, MD  insulin NPH Human (NOVOLIN N) 100 UNIT/ML injection 25 units each morning, and 15 units each evening. 10/17/20   Renato Shin, MD  insulin regular (NOVOLIN R) 100 units/mL injection Inject 0-0.08 mLs (0-8 Units total) into the skin 3 (three) times daily before meals. 10/17/20   Renato Shin, MD  Insulin Syringe-Needle U-100 (B-D INS SYR ULTRAFINE .5CC/30G) 30G X 1/2" 0.5 ML MISC 1 Device by Does not apply route daily. Up to 5x per day with insulin bid (NPH) and tid (Regular) 09/26/20   McLean-Scocuzza, Nino Glow, MD  meclizine (ANTIVERT) 12.5 MG tablet Take 1 tablet (12.5 mg total) by mouth daily as needed for dizziness. 09/02/19   McLean-Scocuzza, Nino Glow, MD  Multiple Vitamin (MULTIVITAMIN WITH MINERALS) TABS tablet Take 1 tablet by mouth daily.    [provider]  mupirocin  ointment (BACTROBAN) 2 % Apply 1 application topically 2 (two) times daily. Right index finger and left forearm 03/23/20   McLean-Scocuzza, Nino Glow, MD  polyethylene glycol powder (GLYCOLAX/MIRALAX) 17 GM/SCOOP powder Take 17 g by mouth daily as needed for moderate constipation or severe constipation. Can take up to 2x per day prn. Mix with 8 ounces of liquid 11/10/19   McLean-Scocuzza, Nino Glow, MD  rosuvastatin (CRESTOR) 20 MG tablet TAKE 1 TABLET (20 MG TOTAL) BY MOUTH DAILY. 06/30/20   O'NealCassie Freer, MD  spironolactone (ALDACTONE) 25 MG tablet Take 1 tablet (25 mg total) by mouth daily. 01/22/20   McLean-Scocuzza, Nino Glow, MD  torsemide (DEMADEX) 20 MG tablet Take 2 tablets (40 mg) twice daily for 5 days, then reduce to 1 tablet (20 mg) daily 09/30/20   O'Neal, Cassie Freer, MD    Allergies    Jardiance [empagliflozin], Penicillin v, and Penicillins  Review of  Systems   Review of Systems  Constitutional: Negative for chills and fever.  HENT: Negative.   Respiratory: Negative.   Cardiovascular: Negative.   Gastrointestinal: Negative.   Musculoskeletal: Negative.   Skin: Negative.   Neurological: Negative.     Physical Exam Updated Vital Signs BP (!) 165/52 (BP Location: Left Arm)   Pulse (!) 103   Temp 98.9 F (37.2 C)   Resp 17   Ht '5\' 3"'  (1.6 m)   Wt 116.6 kg   SpO2 96%   BMI 45.53 kg/m   Physical Exam Constitutional:      Appearance: She is well-developed.  HENT:     Head: Normocephalic.     Nose: Nose normal.     Mouth/Throat:     Mouth: Mucous membranes are moist.  Eyes:     Conjunctiva/sclera: Conjunctivae normal.  Cardiovascular:     Rate and Rhythm: Normal rate and regular rhythm.     Heart sounds: No murmur heard.   Pulmonary:     Effort: Pulmonary effort is normal.     Breath sounds: Normal breath sounds. No stridor. No wheezing, rhonchi or rales.  Abdominal:     General: Bowel sounds are normal.     Palpations: Abdomen is soft.     Tenderness:  There is no abdominal tenderness.  Musculoskeletal:        General: Normal range of motion.     Cervical back: Normal range of motion and neck supple.  Skin:    General: Skin is warm and dry.     Findings: No rash.  Neurological:     Mental Status: She is alert and oriented to person, place, and time.  Psychiatric:        Mood and Affect: Mood normal.     ED Results / Procedures / Treatments   Labs (all labs ordered are listed, but only abnormal results are displayed) Labs Reviewed - No data to display  EKG None  Radiology No results found.  Procedures Procedures   Medications Ordered in ED Medications - No data to display  ED Course  I have reviewed the triage vital signs and the nursing notes.  Pertinent labs & imaging results that were available during my care of the patient were reviewed by me and considered in my medical decision making (see chart for details).    MDM Rules/Calculators/A&P                          Patient to ED after exposure to [facility] fumes and subsequent panic attack.   She is asymptomatic now. Symptoms lasted less than 30 minutes. She states she reqrets calling 911 and feels her symptoms were more related to panic than to illness. She is requesting to be discharged.   She is felt stable, with normal exam, no hypoxia, facial or oral swelling/irritation.  Final Clinical Impression(s) / ED Diagnoses Final diagnoses:  None   1. Anxiety and panic  Rx / DC Orders ED Discharge Orders    None       Charlann Lange, PA-C 11/18/20 [id number]    Veryl Speak, MD 11/19/20 [phone]

## 2020-11-18 NOTE — Discharge Instructions (Addendum)
Please return to the ED with any new or worsening symptoms.

## 2020-11-23 ENCOUNTER — Encounter: Payer: Self-pay | Admitting: Podiatry

## 2020-11-23 ENCOUNTER — Telehealth: Payer: Self-pay | Admitting: Internal Medicine

## 2020-11-23 ENCOUNTER — Ambulatory Visit (INDEPENDENT_AMBULATORY_CARE_PROVIDER_SITE_OTHER): Payer: Medicare Other | Admitting: Podiatry

## 2020-11-23 ENCOUNTER — Other Ambulatory Visit: Payer: Self-pay

## 2020-11-23 DIAGNOSIS — M79674 Pain in right toe(s): Secondary | ICD-10-CM | POA: Diagnosis not present

## 2020-11-23 DIAGNOSIS — M79675 Pain in left toe(s): Secondary | ICD-10-CM

## 2020-11-23 DIAGNOSIS — E0822 Diabetes mellitus due to underlying condition with diabetic chronic kidney disease: Secondary | ICD-10-CM

## 2020-11-23 DIAGNOSIS — B351 Tinea unguium: Secondary | ICD-10-CM

## 2020-11-23 DIAGNOSIS — N1831 Chronic kidney disease, stage 3a: Secondary | ICD-10-CM

## 2020-11-23 DIAGNOSIS — Z794 Long term (current) use of insulin: Secondary | ICD-10-CM

## 2020-11-23 DIAGNOSIS — I739 Peripheral vascular disease, unspecified: Secondary | ICD-10-CM

## 2020-11-23 NOTE — Telephone Encounter (Signed)
Okay for DME order? If so I can file this and fax It

## 2020-11-23 NOTE — Telephone Encounter (Signed)
This has already been completed check fax stack

## 2020-11-23 NOTE — Telephone Encounter (Signed)
Patient is requesting a prescription for diabetic shoes.  [facility] in [place] is the fax number. Phone number [phone].

## 2020-11-23 NOTE — Telephone Encounter (Signed)
In folder to be faxed tomorrow morning, 11/24/20

## 2020-11-24 ENCOUNTER — Telehealth: Payer: Self-pay | Admitting: Podiatry

## 2020-11-24 ENCOUNTER — Ambulatory Visit (INDEPENDENT_AMBULATORY_CARE_PROVIDER_SITE_OTHER): Payer: Medicare Other

## 2020-11-24 ENCOUNTER — Telehealth: Payer: Self-pay | Admitting: Internal Medicine

## 2020-11-24 VITALS — Ht 63.0 in | Wt 257.0 lb

## 2020-11-24 DIAGNOSIS — Z0000 Encounter for general adult medical examination without abnormal findings: Secondary | ICD-10-CM | POA: Diagnosis not present

## 2020-11-24 NOTE — Telephone Encounter (Signed)
Please sign the form for diabetic shoes per podiatry pt qualifies for show and signature must come from whomever manages the patients diabetes last seen 10/2020  Patient wants these as well   Sincerely  Dr. Olivia Mackie McLean-Scocozza

## 2020-11-24 NOTE — Patient Instructions (Addendum)
Alexis Farmer , Thank you for taking time to come for your Medicare Wellness Visit. I appreciate your ongoing commitment to your health goals. Please review the following plan we discussed and let me know if I can assist you in the future.   These are the goals we discussed:  Goals      (RNCM) Monitor and Manage My Blood Sugar     Timeframe:  Long-Range Goal Priority:  High Start Date:    09/28/2020                         Expected End Date:  03/17/21                      Follow Up Date 11/09/20   Check blood sugar at least 4 times a day Check blood sugar if I feel it is too high or too low Enter blood sugar readings and medication/insulin into daily log Take the blood sugar log to all doctor visits  Discuss goal A1C with endocrinology  Take insulins as prescribed (25 Units in am and 15 Units in pm)   Why is this important?   Checking your blood sugar at home helps to keep it from getting very high or very low.  Writing the results in a diary or log helps the doctor know how to care for you.  Your blood sugar log should have the time, date and the results.  Also, write down the amount of insulin or other medicine that you take.  Other information, like what you ate, exercise done and how you were feeling, will also be helpful.     Notes:       (RNCM) Track and Manage Fluids and Swelling-Heart Failure     Timeframe:  Long-Range Goal Priority:  Medium Start Date:   09/28/20                          Expected End Date:  03/17/21                     Follow Up Date 11/09/20    Please consider obtaining scale for home (large print or verbally read out numbers) Call office if I gain more than 2 pounds in one day or 5 pounds in one week Use salt in moderation Watch for swelling in feet, ankles and legs every day Weigh myself daily and write weights in log Take log to all provider appointments   Why is this important?   It is important to check your weight daily and watch how much  salt and liquids you have.  It will help you to manage your heart failure.    Notes:       Healthy Lifestyle     Stay active. Stretch. Stay hydrated Healthy diet         This is a list of the screening recommended for you and due dates:  Health Maintenance  Topic Date Due   COVID-19 Vaccine (4 - Booster for [place] series) 12/10/2020*   Pneumonia vaccines (1 of 2 - PCV13) 05/18/2021*   DEXA scan (bone density measurement)  11/24/2021*   Tetanus Vaccine  11/24/2021*   Flu Shot  01/16/2021   Hemoglobin A1C  04/19/2021   Eye exam for diabetics  05/16/2021   Complete foot exam   10/17/2021   Zoster (Shingles) Vaccine  Completed   Pneumococcal Vaccination  Aged Out   HPV Vaccine  Aged Out  *Topic was postponed. The date shown is not the original due date.

## 2020-11-24 NOTE — Progress Notes (Addendum)
Subjective:   Alexis Farmer is a 80 y.o. female who presents for Medicare Annual (Subsequent) preventive examination.  Review of Systems    No ROS.  Medicare Wellness Virtual Visit.  Visual/audio telehealth visit, UTA vital signs.   See social history for additional risk factors.   Cardiac Risk Factors include: advanced age (>33mn, >>80women);diabetes mellitus;hypertension     Objective:    Today's Vitals   11/24/20 1039  Weight: 257 lb (116.6 kg)  Height: '5\' 3"'  (1.6 m)   Body mass index is 45.53 kg/m.  Advanced Directives 11/24/2020 09/28/2020 05/05/2020 01/21/2020 11/26/2019 07/07/2018 07/02/2018  Does Patient Have a Medical Advance Directive? No No No No No No No  Would patient like information on creating a medical advance directive? No - Patient declined No - Patient declined No - Patient declined No - Patient declined No - Patient declined No - Patient declined No - Patient declined    Current Medications (verified) Outpatient Encounter Medications as of 11/24/2020  Medication Sig   acetaminophen (TYLENOL) 500 MG tablet Take 500 mg by mouth every 6 (six) hours as needed.    amLODipine (NORVASC) 10 MG tablet Take 1 tablet (10 mg total) by mouth daily.   Ascorbic Acid (VITAMIN C PO) Take 500 mg by mouth daily.   blood glucose meter kit and supplies KIT Accu chek, Dx code E11.65, check 3 times daily   cloNIDine (CATAPRES) 0.2 MG tablet Take 1 tablet (0.2 mg total) by mouth 3 (three) times daily.   glucose blood test strip Use as instructed tid  Accuchek Guide   insulin NPH Human (NOVOLIN N) 100 UNIT/ML injection 25 units each morning, and 15 units each evening.   insulin regular (NOVOLIN R) 100 units/mL injection Inject 0-0.08 mLs (0-8 Units total) into the skin 3 (three) times daily before meals.   Insulin Syringe-Needle U-100 (B-D INS SYR ULTRAFINE .5CC/30G) 30G X 1/2" 0.5 ML MISC 1 Device by Does not apply route daily. Up to 5x per day with insulin bid (NPH) and tid (Regular)    meclizine (ANTIVERT) 12.5 MG tablet Take 1 tablet (12.5 mg total) by mouth daily as needed for dizziness.   Multiple Vitamin (MULTIVITAMIN WITH MINERALS) TABS tablet Take 1 tablet by mouth daily.   mupirocin ointment (BACTROBAN) 2 % Apply 1 application topically 2 (two) times daily. Right index finger and left forearm   polyethylene glycol powder (GLYCOLAX/MIRALAX) 17 GM/SCOOP powder Take 17 g by mouth daily as needed for moderate constipation or severe constipation. Can take up to 2x per day prn. Mix with 8 ounces of liquid   rosuvastatin (CRESTOR) 20 MG tablet TAKE 1 TABLET (20 MG TOTAL) BY MOUTH DAILY.   spironolactone (ALDACTONE) 25 MG tablet Take 1 tablet (25 mg total) by mouth daily.   torsemide (DEMADEX) 20 MG tablet Take 2 tablets (40 mg) twice daily for 5 days, then reduce to 1 tablet (20 mg) daily   No facility-administered encounter medications on file as of 11/24/2020.    Allergies (verified) Jardiance [empagliflozin], Penicillin v, and Penicillins   History: Past Medical History:  Diagnosis Date   Arthritis    Chickenpox    Diabetes mellitus without complication ([address]    one elevated reading/ no treatment   Diverticulitis    GI bleed    High cholesterol    History of blood transfusion    Hypertension    Renal insufficiency    Past Surgical History:  Procedure Laterality Date   APPENDECTOMY  CHOLECYSTECTOMY     ECTOPIC PREGNANCY SURGERY     EYE SURGERY     bilateral cataracts   EYE SURGERY     02/11/2019 repair hole in right eye    gallbladder      HYSTEROSCOPY WITH D & C N/A 10/26/2016   Procedure: DILATATION AND CURETTAGE /HYSTEROSCOPY;  Surgeon: Benjaman Kindler, MD;  Location: [facility] ORS;  Service: Gynecology;  Laterality: N/A;   HYSTEROSCOPY WITH D & C N/A 07/07/2018   Procedure: DILATATION AND CURETTAGE /HYSTEROSCOPY;  Surgeon: Benjaman Kindler, MD;  Location: [facility] ORS;  Service: Gynecology;  Laterality: N/A;   THYROIDECTOMY, PARTIAL     Family History   Problem Relation Age of Onset   Diabetes Mother    Hypertension Mother    Stroke Mother    Diabetes Other    Healthy Father    Diabetes Sister    Heart disease Sister    Social History   Socioeconomic History   Marital status: Widowed    Spouse name: Not on file   Number of children: 1   Years of education: 7   Highest education level: 12th grade  Occupational History   Occupation: retired    Comment: hx of Pharmacist, hospital, Secretary/administrator, 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  Tobacco Use   Smoking status: Never   Smokeless tobacco: Never  Vaping Use   Vaping Use: Never used  Substance and Sexual Activity   Alcohol use: No   Drug use: No   Sexual activity: Not Currently  Other Topics Concern   Not on file  Social History Narrative   Lives alone    From [place]   Widowed - was married 3 times starting at age 5    hx of seamtress, 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   Social Determinants of Health   Financial Resource Strain: Low Risk    Difficulty of Paying Living Expenses: Not hard at all  Food Insecurity: No Food Insecurity   Worried About Charity fundraiser in the Last Year: Never true   Arboriculturist in the Last Year: Never true  Transportation Needs: No Transportation Needs   Lack of Transportation (Medical): No   Lack of Transportation (Non-Medical): No  Physical Activity: Inactive   Days of Exercise per Week: 0 days   Minutes of Exercise per Session: 0 min  Stress: Stress Concern Present   Feeling of Stress : To some extent  Social Connections: Socially Isolated   Frequency of Communication with Friends and Family: More than three times a week   Frequency of Social Gatherings with Friends and Family: More than three times a week   Attends Religious Services: Never   Marine scientist or Organizations: No   Attends Archivist Meetings: Never   Marital Status: Widowed     Tobacco Counseling Counseling given: Not Answered   Clinical Intake:  Pre-visit preparation completed: Yes        Diabetes: Yes (Followed by pcp)  How often do you need to have someone help you when you read instructions, pamphlets, or other written materials from your doctor or pharmacy?: 4 - Often   Interpreter Needed?: No      Activities of Daily Living In your present state of health, do you have any difficulty performing the following activities: 11/24/2020 05/05/2020  Hearing? N N  Vision? N N  Difficulty concentrating or making decisions? Y N  Comment Notes  some difficulty with short term memory -  Walking or climbing stairs? Y N  Dressing or bathing? N N  Doing errands, shopping? Y N  Preparing Food and eating ? N N  Using the Toilet? N N  In the past six months, have you accidently leaked urine? N N  Do you have problems with loss of bowel control? N N  Managing your Medications? N N  Managing your Finances? N N  Housekeeping or managing your Housekeeping? N Y  Comment - Patient has in-home care services.  Some recent data might be hidden    Patient Care Team: McLean-Scocuzza, Nino Glow, MD as PCP - General (Internal Medicine) O'Neal, Cassie Freer, MD as Consulting Physician (Internal Medicine) Murlean Iba, MD (Nephrology) Benjaman Kindler, MD as Consulting Physician (Obstetrics and Gynecology) Benjaman Kindler, MD as Consulting Physician (Obstetrics and Gynecology) [name], DPM as Consulting Physician (Podiatry) [name], DPM as Consulting Physician (Podiatry) Leona Singleton, RN as Case Manager  Indicate any recent Medical Services you may have received from other than [facility] providers in the past year (date may be approximate).     Assessment:   This is a routine wellness examination for Alexis Farmer.  I connected with Alexis Farmer today by telephone and verified that I am speaking with the correct person using two  identifiers. Location patient: home Location provider: work Persons participating in the virtual visit: patient, Marine scientist.    I discussed the limitations, risks, security and privacy concerns of performing an evaluation and management service by telephone and the availability of in person appointments. The patient expressed understanding and verbally consented to this telephonic visit.    Interactive audio and video telecommunications were attempted between this provider and patient, however failed, due to patient having technical difficulties OR patient did not have access to video capability.  We continued and completed visit with audio only.  Some vital signs may be absent or patient reported.   Hearing/Vision screen Hearing Screening - Comments:: Patient has difficulty hearing conversational tones. Does not currently wear hearing aids. Notes audiology completed about a year ago. Vision Screening - Comments:: Wears corrective lenses Visual acuity not assessed, virtual visit.  They have seen their ophthalmologist in the last 6 months.    Dietary issues and exercise activities discussed: Current Exercise Habits: Home exercise routine, Type of exercise: walking, Intensity: Mild She tries to eat a healthy diet.  Good fluid intake.    Goals Addressed             This Visit's Progress    Healthy Lifestyle       Stay active. Stretch. Stay hydrated Healthy diet        Depression Screen PHQ 2/9 Scores 11/24/2020 09/28/2020 08/23/2020 05/05/2020 05/04/2020 03/23/2020 01/21/2020  PHQ - 2 Score 0 '1 3 3 3 ' 0 0  PHQ- 9 Score - - '7 6 7 ' - -    Fall Risk Fall Risk  11/24/2020 09/28/2020 05/05/2020 05/05/2020 03/23/2020  Falls in the past year? 0 0 1 0 1  Comment - - - - -  Number falls in past yr: 0 0 1 0 1  Comment - - - - -  Injury with Fall? - 0 1 0 1  Comment - - - - -  Risk for fall due to : - Impaired mobility;Impaired vision;Medication side effect;Impaired balance/gait;History of  fall(s) History of fall(s);Impaired balance/gait;Impaired mobility No Fall Risks -  Risk for fall due to: Comment - - - - -  Follow up Falls evaluation completed Falls evaluation completed;Education provided;Falls prevention discussed - Education provided;Falls prevention discussed Falls evaluation completed    FALL RISK PREVENTION PERTAINING TO THE HOME: Handrails in use when climbing stairs? Yes Home free of loose throw rugs in walkways, pet beds, electrical cords, etc? Yes  Adequate lighting in your home to reduce risk of falls? Yes   ASSISTIVE DEVICES UTILIZED TO PREVENT FALLS: Use of a cane, walker or w/c? Yes   TIMED UP AND GO: Was the test performed? No .   Cognitive Function: Patient is alert and oriented x3.      6CIT Screen 11/24/2020 04/17/2017  What Year? 0 points 0 points  What month? 0 points 0 points  What time? 0 points 0 points  Count back from 20 0 points 0 points  Months in reverse 0 points 0 points  Repeat phrase 0 points 0 points  Total Score 0 0    Immunizations Immunization History  Administered Date(s) Administered   Fluad Quad(high Dose 65+) 03/12/2019, 03/23/2020   Influenza Split 03/23/2020   Influenza, High Dose Seasonal PF 03/22/2017, 03/25/2018   Influenza-Unspecified 03/19/2015, 06/24/2016   PFIZER(Purple Top)SARS-COV-2 Vaccination 08/09/2019, 09/02/2019, 06/28/2020   Zoster Recombinat (Shingrix) 07/06/2020    TDAP status: Due, Education has been provided regarding the importance of this vaccine. Advised may receive this vaccine at local pharmacy or Health Dept. Aware to provide a copy of the vaccination record if obtained from local pharmacy or Health Dept. Verbalized acceptance and understanding.  Flu Vaccine status: Up to date  Pneumococcal vaccine status: Due, Education has been provided regarding the importance of this vaccine. Advised may receive this vaccine at local pharmacy or Health Dept. Aware to provide a copy of the vaccination  record if obtained from local pharmacy or Health Dept. Verbalized acceptance and understanding. Deferred.   Covid-19 vaccine status: Completed vaccines. 1 booster complete.   Health Maintenance Health Maintenance  Topic Date Due   COVID-19 Vaccine (4 - Booster for Pfizer series) 12/10/2020 (Originally 10/26/2020)   PNA vac Low Risk Adult (1 of 2 - PCV13) 05/18/2021 (Originally 01/14/2004)   DEXA SCAN  11/24/2021 (Originally 01/14/2004)   TETANUS/TDAP  11/24/2021 (Originally 01/13/1960)   INFLUENZA VACCINE  01/16/2021   HEMOGLOBIN A1C  04/19/2021   OPHTHALMOLOGY EXAM  05/16/2021   FOOT EXAM  10/17/2021   Zoster Vaccines- Shingrix  Completed   Pneumococcal Vaccine 47-28 Years old  Aged Out   HPV VACCINES  Aged Out    Colorectal cancer screening: No longer required.   Mammogram status: Completed 08/02/20. Repeat every year  Bone density- deferred per patient.   Lung Cancer Screening: (Low Dose CT Chest recommended if Age 88-80 years, 30 pack-year currently smoking OR have quit w/in 15years.) does not qualify.   Hepatitis C Screening: does not qualify  Vision Screening: Recommended annual ophthalmology exams for early detection of glaucoma and other disorders of the eye. Is the patient up to date with their annual eye exam?  Yes   Dental Screening: Recommended annual dental exams for proper oral hygiene  Community Resource Referral / Chronic Care Management: CRR required this visit?  No   CCM required this visit?  No      Plan:   Keep all routine maintenance appointments.   I have personally reviewed and noted the following in the patient's chart:   Medical and social history Use of alcohol, tobacco or illicit drugs  Current medications and supplements including opioid prescriptions. Patient does not currently  use opioid.  Functional ability and status Nutritional status Physical activity Advanced directives List of other physicians Hospitalizations, surgeries, and ER  visits in previous 12 months Vitals Screenings to include cognitive, depression, and falls Referrals and appointments  In addition, I have reviewed and discussed with patient certain preventive protocols, quality metrics, and best practice recommendations. A written personalized care plan for preventive services as well as general preventive health recommendations were provided to patient via mail.     Varney Biles, LPN   09/18/3198       Dr. Olivia Mackie McLean-Scocuzza

## 2020-11-24 NOTE — Telephone Encounter (Signed)
Pt called to let me know that her pcp is to be faxing the diabetic shoe paperwork today.  I told pt that once we get the paperwork the shoes and inserts usually takes 2 to 3 wks to get here.

## 2020-11-25 ENCOUNTER — Telehealth: Payer: Self-pay | Admitting: Podiatry

## 2020-11-25 NOTE — Telephone Encounter (Signed)
Due to foot deformities per podiatry it appears she qualifies and she has uncontrolled diabetes which could lead to neurology  Podiatry is awaiting your approval for the signature of this form and so is the patient who keeps calling my office   Thank you

## 2020-11-25 NOTE — Telephone Encounter (Signed)
Pt left message asking if we had documents needed for her diabetic shoes.  I returned call and was leaving a message and she called back and I explained that the pcp did sign it but wrote in that Dr Loanne Drilling is the endocrinologist and it is not medicare compliant. She said she is changing doctors from Dr Loanne Drilling but was not sure who to go to and I recommended she talk to her pcp about it. I apologized but we have to go by medicare guidelines.

## 2020-11-25 NOTE — Progress Notes (Signed)
Patient in office today and seen by EJ with [facility]. Patient was measured for diabetic shoes and a shoe selection was made by patient. Office will call the patient when shoes are ready for pickup.

## 2020-11-28 ENCOUNTER — Ambulatory Visit: Payer: Medicare Other | Admitting: *Deleted

## 2020-11-28 DIAGNOSIS — I5032 Chronic diastolic (congestive) heart failure: Secondary | ICD-10-CM

## 2020-11-28 DIAGNOSIS — IMO0002 Reserved for concepts with insufficient information to code with codable children: Secondary | ICD-10-CM

## 2020-11-28 NOTE — Chronic Care Management (AMB) (Signed)
  Care Management   Follow Up Note   11/28/2020 Name: Alexis Farmer MRN: [id number] DOB: 03-10-1941   Referred by: McLean-Scocuzza, Nino Glow, MD Reason for referral : Chronic Care Management (CHF, DM)   Successful telephone outreach to patient for CCM follow up.  Patient reports she is not feeling well today and does not want to talk; requesting call back another day and time.  Follow Up Plan: The care management team will reach out to the patient again over the next 30 business days per patients request.   [name] RN, MSN RN Care Management Coordinator [place] [phone] [email or web address]

## 2020-11-29 NOTE — Telephone Encounter (Signed)
According to Dr Olivia Mackie McLean-Scocuzza this was faxed and denied because Dr Olivia Mackie McLean-Scocuzza no longer manages the Patient's diabetes. Patient's endocrinologist will not fill her diabetic shoes.   Patient will be switching endocrinologist. Referral placed for [facility] Endo

## 2020-11-29 NOTE — Addendum Note (Signed)
Addended by: Thressa Sheller on: 11/29/2020 11:16 AM   Modules accepted: Orders

## 2020-11-29 NOTE — Progress Notes (Signed)
Subjective: Alexis Farmer is a pleasant 80 y.o. female patient seen today painful thick toenails that are difficult to trim. Pain interferes with ambulation. Aggravating factors include wearing enclosed shoe gear. Pain is relieved with periodic professional debridement.  PCP is McLean-Scocuzza, Nino Glow, MD. Last visit was: 08/25/2020.  Patient is followed by Dr. Renato Shin for Endocrinology. Last visit was 10/17/2020.  She is also followed by Vascular, Dr. Jamelle Haring. Last visit was 10/04/2020.  She would like to know the status of her diabetic shoes on today's visit.  She still has not been able to find retail shoes to accommodate her pedal edema.   Allergies  Allergen Reactions   Jardiance [Empagliflozin]    Penicillin V     Other reaction(s): rash   Penicillins Rash    Has patient had a PCN reaction causing immediate rash, facial/tongue/throat swelling, SOB or lightheadedness with hypotension: Yes Has patient had a PCN reaction causing severe rash involving mucus membranes or skin necrosis: No Has patient had a PCN reaction that required hospitalization No Has patient had a PCN reaction occurring within the last 10 years: Yes If all of the above answers are "NO", then may proceed with Cephalosporin use.    Objective: Physical Exam  General: Alexis Farmer is a pleasant 80 y.o. African American female, morbidly obese, in NAD. AAO x 3.   Vascular:  Capillary fill time to digits <3 seconds b/l lower extremities. Nonpalpable pedal pulse(s) b/l lower extremities. Pedal hair absent. Lower extremity skin temperature gradient within normal limits. No pain with calf compression b/l. Nonpitting edema noted b/l lower extremities.  Dermatological:  Pedal skin with normal turgor, texture and tone bilaterally. No open wounds bilaterally. No interdigital macerations bilaterally. Toenails 1-5 b/l elongated, discolored, dystrophic, thickened, crumbly with subungual debris and  tenderness to dorsal palpation.  Musculoskeletal:  Normal muscle strength 5/5 to all lower extremity muscle groups bilaterally. No pain crepitus or joint limitation noted with ROM b/l. No gross bony deformities bilaterally.  Neurological:  Protective sensation decreased with 10 gram monofilament b/l. Clonus negative b/l.  Assessment and Plan:  1. Pain due to onychomycosis of toenails of both feet   2. PAD (peripheral artery disease) ([place])   3. 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 -We are awaiting signed diabetic shoe certification form for her shoes. -Continue diabetic foot care principles. -Patient to continue soft, supportive shoe gear daily. -Toenails 1-5 b/l were debrided in length and girth with sterile nail nippers and dremel without iatrogenic bleeding.  -Patient to report any pedal injuries to medical professional immediately. -Patient/POA to call should there be question/concern in the interim.  Return in about 3 months (around 02/23/2021).  [name], DPM

## 2020-12-03 ENCOUNTER — Other Ambulatory Visit: Payer: Self-pay | Admitting: Cardiovascular Disease

## 2020-12-03 DIAGNOSIS — E785 Hyperlipidemia, unspecified: Secondary | ICD-10-CM

## 2020-12-05 ENCOUNTER — Ambulatory Visit (INDEPENDENT_AMBULATORY_CARE_PROVIDER_SITE_OTHER): Payer: Medicare Other | Admitting: *Deleted

## 2020-12-05 DIAGNOSIS — Z794 Long term (current) use of insulin: Secondary | ICD-10-CM

## 2020-12-05 DIAGNOSIS — E1165 Type 2 diabetes mellitus with hyperglycemia: Secondary | ICD-10-CM

## 2020-12-05 DIAGNOSIS — E1122 Type 2 diabetes mellitus with diabetic chronic kidney disease: Secondary | ICD-10-CM | POA: Diagnosis not present

## 2020-12-05 DIAGNOSIS — I5032 Chronic diastolic (congestive) heart failure: Secondary | ICD-10-CM | POA: Diagnosis not present

## 2020-12-05 DIAGNOSIS — IMO0002 Reserved for concepts with insufficient information to code with codable children: Secondary | ICD-10-CM

## 2020-12-05 DIAGNOSIS — N183 Chronic kidney disease, stage 3 unspecified: Secondary | ICD-10-CM

## 2020-12-05 NOTE — Patient Instructions (Signed)
Visit Information  PATIENT GOALS:  Goals Addressed             This Visit's Progress    (RNCM) Monitor and Manage My Blood Sugar   On track    Timeframe:  Long-Range Goal Priority:  High Start Date:    09/28/2020                         Expected End Date:  03/17/21                      Follow Up Date 12/28/20   Check blood sugar at least 4 times a day Check blood sugar if I feel it is too high or too low Enter blood sugar readings and medication/insulin into daily log Take the blood sugar log to all doctor visits  Discuss goal A1C with endocrinology  Take insulins as prescribed (25 Units in am and 15 Units in pm)   Why is this important?   Checking your blood sugar at home helps to keep it from getting very high or very low.  Writing the results in a diary or log helps the doctor know how to care for you.  Your blood sugar log should have the time, date and the results.  Also, write down the amount of insulin or other medicine that you take.  Other information, like what you ate, exercise done and how you were feeling, will also be helpful.     Notes:       (RNCM) Track and Manage Fluids and Swelling-Heart Failure   Not on track    Timeframe:  Long-Range Goal Priority:  Medium Start Date:   09/28/20                          Expected End Date:  03/17/21                     Follow Up Date 12/28/20    Please consider obtaining scale for home (large print or verbally read out numbers) Call office if I gain more than 2 pounds in one day or 5 pounds in one week Use salt in moderation Watch for swelling in feet, ankles and legs every day Weigh myself daily and write weights in log Take log to all provider appointments   Why is this important?   It is important to check your weight daily and watch how much salt and liquids you have.  It will help you to manage your heart failure.    Notes:          The patient verbalized understanding of instructions, educational  materials, and care plan provided today and declined offer to receive copy of patient instructions, educational materials, and care plan.   The care management team will reach out to the patient again over the next 30 business days.   [name] RN, MSN RN Care Management Coordinator [place] [phone] [email or web address]

## 2020-12-05 NOTE — Chronic Care Management (AMB) (Signed)
Chronic Care Management   CCM RN Visit Note  12/05/2020 Name: Alexis Farmer MRN: [id number] DOB: 17-Apr-1941  Subjective: Alexis Farmer is a 80 y.o. year old female who is a primary care patient of McLean-Scocuzza, Alexis Glow, MD. The care management team was consulted for assistance with disease management and care coordination needs.    Engaged with patient by telephone for follow up visit in response to provider referral for case management and/or care coordination services.   Consent to Services:  The patient was given information about Chronic Care Management services, agreed to services, and gave verbal consent prior to initiation of services.  Please see initial visit note for detailed documentation.   Patient agreed to services and verbal consent obtained.   Assessment: Review of patient past medical history, allergies, medications, health status, including review of consultants reports, laboratory and other test data, was performed as part of comprehensive evaluation and provision of chronic care management services.   SDOH (Social Determinants of Health) assessments and interventions performed:    CCM Care Plan  Allergies  Allergen Reactions   Jardiance [Empagliflozin]    Penicillin V     Other reaction(s): rash   Penicillins Rash    Has patient had a PCN reaction causing immediate rash, facial/tongue/throat swelling, SOB or lightheadedness with hypotension: Yes Has patient had a PCN reaction causing severe rash involving mucus membranes or skin necrosis: No Has patient had a PCN reaction that required hospitalization No Has patient had a PCN reaction occurring within the last 10 years: Yes If all of the above answers are "NO", then may proceed with Cephalosporin use.     Outpatient Encounter Medications as of 12/05/2020  Medication Sig   insulin NPH Human (NOVOLIN N) 100 UNIT/ML injection 25 units each morning, and 15 units each evening.   insulin regular (NOVOLIN  R) 100 units/mL injection Inject 0-0.08 mLs (0-8 Units total) into the skin 3 (three) times daily before meals.   spironolactone (ALDACTONE) 25 MG tablet Take 1 tablet (25 mg total) by mouth daily.   torsemide (DEMADEX) 20 MG tablet Take 2 tablets (40 mg) twice daily for 5 days, then reduce to 1 tablet (20 mg) daily   acetaminophen (TYLENOL) 500 MG tablet Take 500 mg by mouth every 6 (six) hours as needed.    amLODipine (NORVASC) 10 MG tablet Take 1 tablet (10 mg total) by mouth daily.   Ascorbic Acid (VITAMIN C PO) Take 500 mg by mouth daily.   blood glucose meter kit and supplies KIT Accu chek, Dx code E11.65, check 3 times daily   cloNIDine (CATAPRES) 0.2 MG tablet Take 1 tablet (0.2 mg total) by mouth 3 (three) times daily.   glucose blood test strip Use as instructed tid  Accuchek Guide   Insulin Syringe-Needle U-100 (B-D INS SYR ULTRAFINE .5CC/30G) 30G X 1/2" 0.5 ML MISC 1 Device by Does not apply route daily. Up to 5x per day with insulin bid (NPH) and tid (Regular)   meclizine (ANTIVERT) 12.5 MG tablet Take 1 tablet (12.5 mg total) by mouth daily as needed for dizziness.   Multiple Vitamin (MULTIVITAMIN WITH MINERALS) TABS tablet Take 1 tablet by mouth daily.   mupirocin ointment (BACTROBAN) 2 % Apply 1 application topically 2 (two) times daily. Right index finger and left forearm   polyethylene glycol powder (GLYCOLAX/MIRALAX) 17 GM/SCOOP powder Take 17 g by mouth daily as needed for moderate constipation or severe constipation. Can take up to 2x per day prn. Mix with 8  ounces of liquid   rosuvastatin (CRESTOR) 20 MG tablet TAKE 1 TABLET BY MOUTH EVERY DAY   No facility-administered encounter medications on file as of 12/05/2020.    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 ([place]) 05/30/2020   Retinopathy 05/30/2020   Primary hypertension 03/25/2020   Multinodular goiter 02/04/2020   Peripheral edema 12/22/2019   Proteinuria 12/22/2019   Constipation 11/17/2019   Recurrent falls 08/13/2019   Physical deconditioning 08/13/2019   Abdominal aortic atherosclerosis ([place]) 07/24/2019   Morbid obesity with BMI of 45.0-49.9, adult ([place]) 06/09/2019   Hypercalcemia 03/18/2019   Round hole, unspecified eye 02/06/2019   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   Disorder of rotator cuff 06/24/2018   Abnormal gait 03/26/2018   Fall 03/26/2018   Arthritis 03/26/2018   Thyroid nodule 01/20/2018   Dysuria 03/22/2017   Cervical spondylosis with radiculopathy 01/26/2017   Rectal hemorrhage 07/29/2016   Carotid artery stenosis 07/19/2016   PAD (peripheral artery disease) ([place]) 07/19/2016   Swelling of lower leg 07/19/2016   Bradycardia 07/17/2016   Postmenopausal bleeding 07/17/2016   Chronic diastolic heart failure ([place]) 05/31/2016   Cough 01/25/2016   Chronic fatigue 11/15/2015   Depression, recurrent ([place]) 10/13/2015   Osteoarthritis 10/13/2015   Vertigo 10/13/2015   Anxiety 09/13/2015   Gastrointestinal hemorrhage 08/03/2015   Hyperglycemia due to type 2 diabetes mellitus ([place]) 03/11/2015   Mixed hyperlipidemia 08/26/2014   Essential hypertension 04/08/2014   Diabetes mellitus type II, uncontrolled ([place]) 04/08/2014   Chronic kidney disease, stage 2 (mild) 04/08/2014   Edema of foot 04/08/2014   Morbid obesity ([place]) 04/08/2014   Low back pain 04/08/2014   Diabetic polyneuropathy ([place]) 04/08/2014   Type 2 diabetes mellitus with other diabetic kidney complication ([place]) 03/88/8280    Conditions to be addressed/monitored:CHF and DMII  Care Plan : Diabetes Type 2 (Adult)  Updates made by Leona Singleton, RN since 12/05/2020 12:00 AM     Problem: Glycemic Management (Diabetes, Type 2)    Priority: High     Long-Range Goal: Patient will verbalize decrease in Hgb A1C by 0.3 points within the next 90 days   Start Date: 09/28/2020  Expected End Date: 03/17/2021  This Visit's Progress: On track  Recent Progress: Not on track  Priority: High  Note:   Objective:  Lab Results  Component Value Date   HGBA1C 9.8 (A) 10/17/2020  Current Barriers:  Knowledge Deficits related to basic Diabetes pathophysiology and self care/management as evidenced by continued elevated blood sugars and Hgb A1C increased to 9.8.  Fasting blood sugar this morning was 244 with recent fasting ranges of 110-200.  Reports a few episodes of hypoglycemia due to not eating. Patient stating her blood sugars have varied from 69-200's with a few 300's pending what she has eaten and trying to correct hypoglycemia .  Acknowledges she does not eat correct at times to manage her diabetes.  Reports being compliant with all her medications and insulins.   Lack of self motivation to self management of diabetes Case Manager Clinical Goal(s):  patient will demonstrate improved adherence to prescribed treatment plan for diabetes self care/management as evidenced by: at least 4 times a day monitoring and recording of CBG,  adherence to ADA/ carb modified diet,  adherence to prescribed medication regimen, contacting provider for new or worsened symptoms or questions Interventions:  Collaboration with McLean-Scocuzza, Alexis Glow, MD regarding development and update of comprehensive plan of care as evidenced by provider attestation and co-signature Inter-disciplinary care team collaboration (see longitudinal plan of care) Provided education to patient about basic DM disease process Reviewed medications with patient, insulin dose adjustments and discussed importance of medication adherence Discussed plans with patient for ongoing care management follow up and provided patient with direct contact information for care management  team Provided patient with verbal educational materials related to hypo and hyperglycemia and importance of correct treatment Reviewed scheduled/upcoming provider appointments including:  eye exam on 11/15/20 Advised patient, providing education and rationale, to check cbg at least 4 times a day and record, calling primary care provider or endocrinologist after consultation for findings outside established parameters.   Sent Diabetes Education and encouraged patient to review information Discussed diabetic carbohydrate modified diet and foods to limit or avoid; Provided medical nutrition therapy and development of individualized eating Discussed current Hgb A1C and what is normal and what is patient's goal; per patient her goal is below 10-discussed and encouraged patient to review goal with provider and to consider changing goal to lower Promoted self-monitoring of blood glucose levels.  Assessed and addressed barriers to management plan, such as food insecurity, age, developmental ability, depression, anxiety, fear of hypoglycemia or weight gain, as well as medication cost, side effects and complicated regimen Discussed new endocrinologist and encouraged patient to schedule appointment  Discussed diabetic shoes and foot care Reviewed current blood sugar readings and discussed hypoglycemia prevention and ways to help achieve better glycemic control Patient Goals/Self-Care Activities: Check blood sugar at least 4 times a day Check blood sugar if I feel it is too high or too low Enter blood sugar readings and medication/insulin into daily log Take the blood sugar log to all doctor visits  Discuss goal A1C with endocrinology  Take insulins as prescribed (25 Units in am and 15 Units in pm) Follow Up Plan: The care management team will reach out to the patient again over the next 30 business days.       Care Plan : Heart Failure (Adult)  Updates made by Leona Singleton, RN since 12/05/2020 12:00  AM     Problem: Symptom Exacerbation (Heart Failure)   Priority: Medium     Long-Range Goal: Patient will report obtaining scale for home within the next 90 days   Start Date: 09/28/2020  Expected End Date: 03/17/2021  This Visit's Progress: Not on track  Recent Progress: Not on track  Priority: Medium  Note:   Current Barriers:  Knowledge deficit related to basic heart failure pathophysiology and self care management as evidenced by not knowing heart failure diagnosis and treatment/self care management.  Continues to not weighing daily.  Continues to report not having scale at home but will consider obtaining one.  Does report compliance with fluid medications.  Reports lower extremity edema is about the same as always, resolves at night and swells with movement throughout the day.  Last BP check at medical appointment was 110/60 and patient happy about that reading. Knowledge Deficits related to heart failure medications Case Manager Clinical Goal(s):  patient will obtain scale to weigh self daily and record patient will verbalize understanding of Heart Failure Action Plan and when to call doctor patient will take all Heart Failure mediations as prescribed patient will weigh daily and record (notifying MD of 3 lb  weight gain over night or 5 lb in a week) Interventions:  Collaboration with McLean-Scocuzza, Alexis Glow, MD regarding development and update of comprehensive plan of care as evidenced by provider attestation and co-signature Inter-disciplinary care team collaboration (see longitudinal plan of care) Basic overview and discussion of pathophysiology of Heart Failure reviewed  Provided verbal education on low sodium diet Assessed need for readable accurate scales in home and encouraged patient to obtain scale with large print or verbally read out weight to patient Provided education about placing scale on hard, flat surface Advised patient to weigh each morning after emptying  bladder Discussed importance of daily weight and advised patient to weigh and record daily Reviewed role of diuretics in prevention of fluid overload and management of heart failure Barriers to lifestyle changes reviewed and addressed; barriers to treatment reviewed and addressed Healthy lifestyle promoted Self-awareness of signs/symptoms of worsening disease encouraged Assessed understanding of adherence and barriers to treatment plan, as well as lifestyle changes; discussed strategies to address barriers Patient Goals/Self-Care Activities: Please consider obtaining scale for home (large print or verbally read out numbers) Call office if I gain more than 2 pounds in one day or 5 pounds in one week Use salt in moderation Watch for swelling in feet, ankles and legs every day Weigh myself daily and write weights in log Take log to all provider appointments Follow Up Plan: The care management team will reach out to the patient again over the next 30 business days.        Plan:The care management team will reach out to the patient again over the next 30 business days.  [name] RN, MSN RN Care Management Coordinator [place] [phone] [email or web address]

## 2020-12-07 ENCOUNTER — Other Ambulatory Visit: Payer: Self-pay | Admitting: *Deleted

## 2020-12-07 NOTE — Patient Outreach (Signed)
[facility]) Care Management  12/07/2020  Alexis Farmer 05/24/1941 [id number]   [place] coordination- collaboration with Alexis Farmer   Incoming outreach from [facility], to review and connect [name] with Alexis Farmer staff [facility]) to review status of [facility] services Reviewed case information and [facility] workflow with Alexis Farmer with an outreach to her at [phone] Pt switched primary care providers (PCP) within the month of March 2022- left [facility] practice related to transportation concerns Pt presently active with Alexis Farmer plus active with [facility] chronic care management services (embedded) All questions answered    Alexis Hemme L. Lavina Hamman, RN, BSN, [place] Coordinator Office number [phone] Mobile number [phone]  Main [facility] number [id number] Fax number [phone]

## 2020-12-15 ENCOUNTER — Observation Stay (HOSPITAL_BASED_OUTPATIENT_CLINIC_OR_DEPARTMENT_OTHER): Payer: Medicare Other

## 2020-12-15 ENCOUNTER — Emergency Department (HOSPITAL_COMMUNITY): Payer: Medicare Other

## 2020-12-15 ENCOUNTER — Observation Stay (HOSPITAL_COMMUNITY)
Admission: EM | Admit: 2020-12-15 | Discharge: 2020-12-16 | Disposition: A | Discharge status: Home / Self Care | Payer: Medicare Other | Attending: Internal Medicine | Admitting: Internal Medicine

## 2020-12-15 ENCOUNTER — Encounter (HOSPITAL_COMMUNITY): Payer: Self-pay | Admitting: Emergency Medicine

## 2020-12-15 DIAGNOSIS — R7989 Other specified abnormal findings of blood chemistry: Secondary | ICD-10-CM | POA: Diagnosis present

## 2020-12-15 DIAGNOSIS — I130 Hypertensive heart and chronic kidney disease with heart failure and stage 1 through stage 4 chronic kidney disease, or unspecified chronic kidney disease: Secondary | ICD-10-CM | POA: Diagnosis not present

## 2020-12-15 DIAGNOSIS — Z79899 Other long term (current) drug therapy: Secondary | ICD-10-CM | POA: Diagnosis not present

## 2020-12-15 DIAGNOSIS — G458 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: Secondary | ICD-10-CM

## 2020-12-15 DIAGNOSIS — R778 Other specified abnormalities of plasma proteins: Secondary | ICD-10-CM | POA: Diagnosis not present

## 2020-12-15 DIAGNOSIS — I2510 Atherosclerotic heart disease of native coronary artery without angina pectoris: Secondary | ICD-10-CM | POA: Diagnosis not present

## 2020-12-15 DIAGNOSIS — I739 Peripheral vascular disease, unspecified: Secondary | ICD-10-CM

## 2020-12-15 DIAGNOSIS — Z20822 Contact with and (suspected) exposure to covid-19: Secondary | ICD-10-CM | POA: Diagnosis not present

## 2020-12-15 DIAGNOSIS — R002 Palpitations: Secondary | ICD-10-CM

## 2020-12-15 DIAGNOSIS — I7090 Unspecified atherosclerosis: Secondary | ICD-10-CM

## 2020-12-15 DIAGNOSIS — I160 Hypertensive urgency: Secondary | ICD-10-CM | POA: Diagnosis not present

## 2020-12-15 DIAGNOSIS — F419 Anxiety disorder, unspecified: Principal | ICD-10-CM

## 2020-12-15 DIAGNOSIS — I5032 Chronic diastolic (congestive) heart failure: Secondary | ICD-10-CM | POA: Diagnosis not present

## 2020-12-15 DIAGNOSIS — E1165 Type 2 diabetes mellitus with hyperglycemia: Secondary | ICD-10-CM

## 2020-12-15 DIAGNOSIS — E1122 Type 2 diabetes mellitus with diabetic chronic kidney disease: Secondary | ICD-10-CM | POA: Diagnosis not present

## 2020-12-15 DIAGNOSIS — IMO0002 Reserved for concepts with insufficient information to code with codable children: Secondary | ICD-10-CM | POA: Diagnosis present

## 2020-12-15 DIAGNOSIS — N1831 Chronic kidney disease, stage 3a: Secondary | ICD-10-CM | POA: Diagnosis not present

## 2020-12-15 DIAGNOSIS — Z794 Long term (current) use of insulin: Secondary | ICD-10-CM | POA: Diagnosis not present

## 2020-12-15 HISTORY — DX: Hypertensive urgency: I16.0

## 2020-12-15 HISTORY — DX: Palpitations: R00.2

## 2020-12-15 HISTORY — DX: Other specified abnormal findings of blood chemistry: R79.89

## 2020-12-15 LAB — BRAIN NATRIURETIC PEPTIDE: B Natriuretic Peptide: 34 pg/mL (ref 0.0–100.0)

## 2020-12-15 LAB — CBC WITH DIFFERENTIAL/PLATELET
Abs Immature Granulocytes: 0.03 10*3/uL (ref 0.00–0.07)
Basophils Absolute: 0.1 10*3/uL (ref 0.0–0.1)
Basophils Relative: 1 %
Eosinophils Absolute: 0.2 10*3/uL (ref 0.0–0.5)
Eosinophils Relative: 3 %
HCT: 42.6 % (ref 36.0–46.0)
Hemoglobin: 13.8 g/dL (ref 12.0–15.0)
Immature Granulocytes: 0 %
Lymphocytes Relative: 48 %
Lymphs Abs: 4 10*3/uL (ref 0.7–4.0)
MCH: 29.4 pg (ref 26.0–34.0)
MCHC: 32.4 g/dL (ref 30.0–36.0)
MCV: 90.6 fL (ref 80.0–100.0)
Monocytes Absolute: 0.9 10*3/uL (ref 0.1–1.0)
Monocytes Relative: 11 %
Neutro Abs: 3.1 10*3/uL (ref 1.7–7.7)
Neutrophils Relative %: 37 %
Platelets: 249 10*3/uL (ref 150–400)
RBC: 4.7 MIL/uL (ref 3.87–5.11)
RDW: 13.8 % (ref 11.5–15.5)
WBC: 8.2 10*3/uL (ref 4.0–10.5)
nRBC: 0 % (ref 0.0–0.2)

## 2020-12-15 LAB — ECHOCARDIOGRAM LIMITED
Area-P 1/2: 12.44 cm2
S' Lateral: 2.75 cm
Single Plane A4C EF: 73.7 %

## 2020-12-15 LAB — COMPREHENSIVE METABOLIC PANEL
ALT: 24 U/L (ref 0–44)
AST: 29 U/L (ref 15–41)
Albumin: 4 g/dL (ref 3.5–5.0)
Alkaline Phosphatase: 47 U/L (ref 38–126)
Anion gap: 9 (ref 5–15)
BUN: 11 mg/dL (ref 8–23)
CO2: 28 mmol/L (ref 22–32)
Calcium: 9.7 mg/dL (ref 8.9–10.3)
Chloride: 98 mmol/L (ref 98–111)
Creatinine, Ser: 1.17 mg/dL — ABNORMAL HIGH (ref 0.44–1.00)
GFR, Estimated: 47 mL/min — ABNORMAL LOW (ref >60)
Glucose, Bld: 268 mg/dL — ABNORMAL HIGH (ref 70–99)
Potassium: 4 mmol/L (ref 3.5–5.1)
Sodium: 135 mmol/L (ref 135–145)
Total Bilirubin: 1.1 mg/dL (ref 0.3–1.2)
Total Protein: 7.1 g/dL (ref 6.5–8.1)

## 2020-12-15 LAB — LIPID PANEL
Cholesterol: 119 mg/dL (ref 0–200)
HDL: 46 mg/dL (ref >40)
LDL Cholesterol: 40 mg/dL (ref 0–99)
Total CHOL/HDL Ratio: 2.6 ratio
Triglycerides: 165 mg/dL — ABNORMAL HIGH (ref <150)
VLDL: 33 mg/dL (ref 0–40)

## 2020-12-15 LAB — GLUCOSE, CAPILLARY
Glucose-Capillary: 251 mg/dL — ABNORMAL HIGH (ref 70–99)
Glucose-Capillary: 288 mg/dL — ABNORMAL HIGH (ref 70–99)

## 2020-12-15 LAB — TROPONIN I (HIGH SENSITIVITY)
Troponin I (High Sensitivity): 15 ng/L (ref <18)
Troponin I (High Sensitivity): 22 ng/L — ABNORMAL HIGH (ref <18)
Troponin I (High Sensitivity): 25 ng/L — ABNORMAL HIGH (ref <18)

## 2020-12-15 LAB — CBG MONITORING, ED
Glucose-Capillary: 189 mg/dL — ABNORMAL HIGH (ref 70–99)
Glucose-Capillary: 253 mg/dL — ABNORMAL HIGH (ref 70–99)

## 2020-12-15 LAB — RESP PANEL BY RT-PCR (FLU A&B, COVID) ARPGX2
Influenza A by PCR: NEGATIVE
Influenza B by PCR: NEGATIVE
SARS Coronavirus 2 by RT PCR: NEGATIVE

## 2020-12-15 LAB — MAGNESIUM: Magnesium: 1.9 mg/dL (ref 1.7–2.4)

## 2020-12-15 LAB — TSH: TSH: 3.367 u[IU]/mL (ref 0.350–4.500)

## 2020-12-15 MED ORDER — LORAZEPAM 0.5 MG PO TABS: 0.2500 mg | ORAL_TABLET | Freq: Four times a day (QID) | ORAL | Status: DC | PRN

## 2020-12-15 MED ORDER — HYDRALAZINE HCL 20 MG/ML IJ SOLN: 10.0000 mg | INTRAMUSCULAR | Status: DC | PRN

## 2020-12-15 MED ORDER — ENOXAPARIN SODIUM 40 MG/0.4ML IJ SOSY
40.0000 mg | PREFILLED_SYRINGE | Freq: Every day | INTRAMUSCULAR | Status: DC
  Administered 2020-12-15 – 2020-12-16 (×2): 40 mg via SUBCUTANEOUS
  Filled 2020-12-15 (×2): qty 0.4

## 2020-12-15 MED ORDER — LORAZEPAM 2 MG/ML IJ SOLN
0.5000 mg | Freq: Once | INTRAMUSCULAR | Status: AC
  Administered 2020-12-15: 0.5 mg via INTRAVENOUS
  Filled 2020-12-15: qty 1

## 2020-12-15 MED ORDER — SODIUM CHLORIDE 0.9% FLUSH
3.0000 mL | Freq: Two times a day (BID) | INTRAVENOUS | Status: DC
  Administered 2020-12-15 – 2020-12-16 (×2): 3 mL via INTRAVENOUS

## 2020-12-15 MED ORDER — ACETAMINOPHEN 325 MG PO TABS
650.0000 mg | ORAL_TABLET | Freq: Four times a day (QID) | ORAL | Status: DC | PRN
  Administered 2020-12-16: 650 mg via ORAL
  Filled 2020-12-15 (×2): qty 2

## 2020-12-15 MED ORDER — CLONIDINE HCL 0.2 MG PO TABS
0.2000 mg | ORAL_TABLET | Freq: Three times a day (TID) | ORAL | Status: DC
  Administered 2020-12-15 – 2020-12-16 (×4): 0.2 mg via ORAL
  Filled 2020-12-15 (×4): qty 1

## 2020-12-15 MED ORDER — INSULIN ASPART 100 UNIT/ML IJ SOLN
0.0000 [IU] | Freq: Three times a day (TID) | INTRAMUSCULAR | Status: DC
Start: 2020-12-15 — End: 2020-12-16
  Administered 2020-12-15 (×2): 5 [IU] via SUBCUTANEOUS
  Administered 2020-12-16: 7 [IU] via SUBCUTANEOUS
  Administered 2020-12-16: 5 [IU] via SUBCUTANEOUS

## 2020-12-15 MED ORDER — SPIRONOLACTONE 25 MG PO TABS
25.0000 mg | ORAL_TABLET | Freq: Every day | ORAL | Status: DC
  Administered 2020-12-16: 25 mg via ORAL
  Filled 2020-12-15 (×2): qty 1

## 2020-12-15 MED ORDER — SODIUM CHLORIDE 0.9 % IV BOLUS
500.0000 mL | Freq: Once | INTRAVENOUS | Status: AC
  Administered 2020-12-15: 500 mL via INTRAVENOUS

## 2020-12-15 MED ORDER — IOHEXOL 350 MG/ML SOLN
100.0000 mL | Freq: Once | INTRAVENOUS | Status: AC | PRN
  Administered 2020-12-15: 100 mL via INTRAVENOUS

## 2020-12-15 MED ORDER — ONDANSETRON HCL 4 MG/2ML IJ SOLN: 4.0000 mg | Freq: Four times a day (QID) | INTRAMUSCULAR | Status: DC | PRN

## 2020-12-15 MED ORDER — SODIUM CHLORIDE 0.9 % IV SOLN: INTRAVENOUS | Status: AC

## 2020-12-15 MED ORDER — ONDANSETRON HCL 4 MG PO TABS: 4.0000 mg | ORAL_TABLET | Freq: Four times a day (QID) | ORAL | Status: DC | PRN

## 2020-12-15 MED ORDER — ACETAMINOPHEN 650 MG RE SUPP: 650.0000 mg | Freq: Four times a day (QID) | RECTAL | Status: DC | PRN

## 2020-12-15 MED ORDER — MECLIZINE HCL 12.5 MG PO TABS
12.5000 mg | ORAL_TABLET | Freq: Every day | ORAL | Status: DC | PRN
  Filled 2020-12-15: qty 1

## 2020-12-15 MED ORDER — CITALOPRAM HYDROBROMIDE 20 MG PO TABS
10.0000 mg | ORAL_TABLET | Freq: Every day | ORAL | Status: DC
  Administered 2020-12-15: 10 mg via ORAL
  Filled 2020-12-15: qty 1

## 2020-12-15 MED ORDER — POLYETHYLENE GLYCOL 3350 17 G PO PACK
17.0000 g | PACK | Freq: Every day | ORAL | Status: DC
  Administered 2020-12-15: 17 g via ORAL
  Filled 2020-12-15: qty 1

## 2020-12-15 MED ORDER — INSULIN NPH (HUMAN) (ISOPHANE) 100 UNIT/ML ~~LOC~~ SUSP
25.0000 [IU] | Freq: Every day | SUBCUTANEOUS | Status: DC
  Administered 2020-12-16: 25 [IU] via SUBCUTANEOUS
  Filled 2020-12-15: qty 10

## 2020-12-15 MED ORDER — INSULIN NPH (HUMAN) (ISOPHANE) 100 UNIT/ML ~~LOC~~ SUSP
15.0000 [IU] | Freq: Every day | SUBCUTANEOUS | Status: DC
  Administered 2020-12-15: 15 [IU] via SUBCUTANEOUS
  Filled 2020-12-15: qty 10

## 2020-12-15 MED ORDER — ALBUTEROL SULFATE (2.5 MG/3ML) 0.083% IN NEBU: 2.5000 mg | INHALATION_SOLUTION | Freq: Four times a day (QID) | RESPIRATORY_TRACT | Status: DC | PRN

## 2020-12-15 MED ORDER — TORSEMIDE 20 MG PO TABS
20.0000 mg | ORAL_TABLET | Freq: Every morning | ORAL | Status: DC
  Administered 2020-12-15 – 2020-12-16 (×2): 20 mg via ORAL
  Filled 2020-12-15 (×2): qty 1

## 2020-12-15 MED ORDER — AMLODIPINE BESYLATE 10 MG PO TABS
10.0000 mg | ORAL_TABLET | Freq: Every day | ORAL | Status: DC
  Administered 2020-12-15 – 2020-12-16 (×2): 10 mg via ORAL
  Filled 2020-12-15: qty 1
  Filled 2020-12-15: qty 2

## 2020-12-15 NOTE — Consult Note (Addendum)
Cardiology Consultation:   Patient ID: Alexis Farmer MRN: [id number]; DOB: 12-31-1940  Admit date: 12/15/2020 Date of Consult: 12/15/2020  PCP:  McLean-Scocuzza, Nino Glow, MD   [facility] Providers Cardiologist:  Ida Rogue, MD   {   Patient Profile:   Alexis Farmer is a 80 y.o. female with a hx of asymptomatic carotid artery disease, right subclavian steal, hyperlipidemia, type 2 diabetes, diastolic heart failure, hypertension, anxiety, PAD who is being seen 12/15/2020 for the evaluation of elevated troponin at the request of Dr. Tamala Julian.  History of Present Illness:   [name] recently transition from Dr. Rockey Situ to Dr. Audie Box on 06/29/2020.  She has chronic diastolic heart failure. Echo from 06/25/2016 with moderate concentric LVH, EF 62%, no WMA, grade 1 DD, no significant valvular disease.  For hypertension, she is on multiple antihypertensive including clonidine, amlodipine, spironolactone, and torsemide.   She has known bilateral carotid artery stenosis with right subclavian artery occlusion, last carotid Doppler on 10/04/2020 revealed right ICA 1 to 39% stenosis, left ICA 1 to 39% stenosis, right subclavian steal.  She also has chronic PAD causing intermittent claudication.  She was last seen by vascular surgery on 10/04/2020, remained asymptomatic from underlying vascular disease, recommended continue aspirin 81 and statin with goal to control hypertension and diabetes reviewed.  She has chronically poorly controlled type 2 diabetes, last hemoglobin A1c 9.8% on 10/17/2020, she followed up with endocrinology on 10/17/2020, was recommended NPH 25 units AM and 15 units PM, continue regular insulin scale.   She was last seen in office 09/30/2020, reports intolerance to Jardiance that was given for CHF and diabetes.  She had mentioned complaints of heart palpitation, event monitor from 3/21-3/23 revealed predominant underlying rhythm sinus rhythm, first-degree AV block present, no  arrhythmia detected, rare ectopy.  She was noted to have elevated blood pressure 156/79 in office and weight gain and leg edema. She was recommended to increase her torsemide to 20 mg BID x5 days and then 36m daily after, she reports that her nephrologist reduced her torsemide dose to 10 mg daily and was reluctant to increase it to 225mbecause of fear of kidney dysfunction.   Patient presented to the ER today complaining heart palpitation, dizziness, anxiety, hot flashes.  She reported worsening of chronic right ear tinnitus that typically occurs with her panic attacks.  She felt her heart was racing and pounding, felt lightheaded, endorses significant anxiety and panic sensation.  EMS has reported patient systolic blood pressure 12629f right arm and 200 on the left arm.  She was brought to the ER for evaluation by EMS. She states she is worried all the time, fear of being alone, feels lonely. She states she does not sleep until 3AM every day because she is worried her glucose would drop too low and she wont wake up.  She has been having intermittent heart palpitation and felt this is due to her uncontrolled anxiety. She states she is able to perform ADLs, denied any chest pain/pressure , SOB, dizziness with daily exertional activities. She endorse chronic leg edema, states she had lost weight recently, is taking her torsemide 2051maily, denied medication non-compliance. She is trying to eat healthy, not consuming much salt, but drinks 1 gallon of water daily.    Diagnostic work-up here revealed unremarkable CBC diff, CMP with glucose 268, creatinine 1.17, GFR 47.  BNP 34.  High sensitive troponin 15 >22 >25.  Magnesium 1.9.  TSH WNL.  Flu and COVID swab negative.  Chest x-ray revealed no acute finding.  CTA revealed no aortic dissection or aneurysm, extensive atherosclerosis including an aberrant origin of the right subclavian artery which is severely stenotic due to bulky calcified plaque but remains  patent, coronary artery and aortic atherosclerosis, no acute or inflammatory process in the chest/abdomen/pelvis noted. EKG SR with first degree AV block, artifact, no acute ST-T changes.  Echocardiogram pending.  She was initially tachycardic and hypertensive had ED with heart rate of 106 and blood pressure 187/79.  She was admitted to hospital medicine, started on Celexa.  Due to elevated troponin, cardiology consult is requested.     Past Medical History:  Diagnosis Date   Arthritis    Chickenpox    Diabetes mellitus without complication ([place])    one elevated reading/ no treatment   Diverticulitis    GI bleed    High cholesterol    History of blood transfusion    Hypertension    Renal insufficiency     Past Surgical History:  Procedure Laterality Date   APPENDECTOMY     CHOLECYSTECTOMY     ECTOPIC PREGNANCY SURGERY     EYE SURGERY     bilateral cataracts   EYE SURGERY     02/11/2019 repair hole in right eye    gallbladder      HYSTEROSCOPY WITH D & C N/A 10/26/2016   Procedure: DILATATION AND CURETTAGE /HYSTEROSCOPY;  Surgeon: Benjaman Kindler, MD;  Location: [facility] ORS;  Service: Gynecology;  Laterality: N/A;   HYSTEROSCOPY WITH D & C N/A 07/07/2018   Procedure: DILATATION AND CURETTAGE /HYSTEROSCOPY;  Surgeon: Benjaman Kindler, MD;  Location: [facility] ORS;  Service: Gynecology;  Laterality: N/A;   THYROIDECTOMY, PARTIAL       Home Medications:  Prior to Admission medications   Medication Sig Start Date End Date Taking? Authorizing Provider  acetaminophen (TYLENOL) 500 MG tablet Take 500 mg by mouth every 6 (six) hours as needed for mild pain or headache.   Yes [provider]  amLODipine (NORVASC) 10 MG tablet Take 1 tablet (10 mg total) by mouth daily. 03/24/20  Yes McLean-Scocuzza, Nino Glow, MD  Ascorbic Acid (VITAMIN C PO) Take 1 tablet by mouth daily.   Yes [provider]  aspirin EC 81 MG tablet Take 81 mg by mouth 2 (two) times a week. Swallow whole.   Yes  [provider]  cloNIDine (CATAPRES) 0.2 MG tablet Take 1 tablet (0.2 mg total) by mouth 3 (three) times daily. 06/01/20  Yes McLean-Scocuzza, Nino Glow, MD  insulin NPH Human (NOVOLIN N) 100 UNIT/ML injection 25 units each morning, and 15 units each evening. Patient taking differently: Inject 15-25 Units into the skin See admin instructions. Inject 25 units into the skin in the morning and 15 units in the evening 10/17/20  Yes Renato Shin, MD  insulin regular (NOVOLIN R) 100 units/mL injection Inject 0-0.08 mLs (0-8 Units total) into the skin 3 (three) times daily before meals. Patient taking differently: Inject 15 Units into the skin See admin instructions. Inject 15 units into the skin two to three times a day with food, PER SLIDING SCALE 10/17/20  Yes Renato Shin, MD  meclizine (ANTIVERT) 12.5 MG tablet Take 1 tablet (12.5 mg total) by mouth daily as needed for dizziness. 09/02/19  Yes McLean-Scocuzza, Nino Glow, MD  Multiple Vitamins-Minerals (CENTRUM SILVER 50+WOMEN) TABS Take 1 tablet by mouth daily with breakfast.   Yes [provider]  mupirocin ointment (BACTROBAN) 2 % Apply 1 application topically 2 (  two) times daily. Right index finger and left forearm Patient taking differently: Apply 1 application topically 2 (two) times daily as needed (to affected sites). 03/23/20  Yes McLean-Scocuzza, Nino Glow, MD  polyethylene glycol powder (GLYCOLAX/MIRALAX) 17 GM/SCOOP powder Take 17 g by mouth daily as needed for moderate constipation or severe constipation. Can take up to 2x per day prn. Mix with 8 ounces of liquid Patient taking differently: Take 17 g by mouth at bedtime. 11/10/19  Yes McLean-Scocuzza, Nino Glow, MD  rosuvastatin (CRESTOR) 20 MG tablet TAKE 1 TABLET BY MOUTH EVERY DAY Patient taking differently: Take 20 mg by mouth daily. TAKE 1 TABLET BY MOUTH EVERY DAY 12/05/20  Yes O'Neal, Cassie Freer, MD  spironolactone (ALDACTONE) 25 MG tablet Take 1 tablet (25 mg total) by mouth  daily. 01/22/20  Yes McLean-Scocuzza, Nino Glow, MD  torsemide (DEMADEX) 20 MG tablet Take 2 tablets (40 mg) twice daily for 5 days, then reduce to 1 tablet (20 mg) daily Patient taking differently: Take 20 mg by mouth in the morning. 09/30/20  Yes O'Neal, Cassie Freer, MD  blood glucose meter kit and supplies KIT Accu chek, Dx code E11.65, check 3 times daily 07/26/17   Leone Haven, MD  glucose blood test strip Use as instructed tid  Accuchek Guide 06/03/20   McLean-Scocuzza, Nino Glow, MD  Insulin Syringe-Needle U-100 (B-D INS SYR ULTRAFINE .5CC/30G) 30G X 1/2" 0.5 ML MISC 1 Device by Does not apply route daily. Up to 5x per day with insulin bid (NPH) and tid (Regular) 09/26/20   McLean-Scocuzza, Nino Glow, MD    Inpatient Medications: Scheduled Meds:  amLODipine  10 mg Oral Daily   citalopram  10 mg Oral QHS   cloNIDine  0.2 mg Oral TID   enoxaparin (LOVENOX) injection  40 mg Subcutaneous Daily   insulin aspart  0-9 Units Subcutaneous TID WC   insulin NPH Human  15 Units Subcutaneous QHS   insulin NPH Human  25 Units Subcutaneous QAC breakfast   polyethylene glycol  17 g Oral QHS   sodium chloride flush  3 mL Intravenous Q12H   spironolactone  25 mg Oral Daily   torsemide  20 mg Oral q AM   Continuous Infusions:  sodium chloride 50 mL/hr at 12/15/20 1246   PRN Meds: acetaminophen **OR** acetaminophen, albuterol, hydrALAZINE, LORazepam, meclizine, ondansetron **OR** ondansetron (ZOFRAN) IV  Allergies:    Allergies  Allergen Reactions   Jardiance [Empagliflozin] Other (See Comments)    Reaction not recalled   Tape Other (See Comments)    Leaves the skin "raw" if left on for a period of time- tolerates paper tape   Penicillin V Rash   Penicillins Rash    Has patient had a PCN reaction causing immediate rash, facial/tongue/throat swelling, SOB or lightheadedness with hypotension: Yes Has patient had a PCN reaction causing severe rash involving mucus membranes or skin necrosis:  No Has patient had a PCN reaction that required hospitalization No Has patient had a PCN reaction occurring within the last 10 years: Yes If all of the above answers are "NO", then may proceed with Cephalosporin use.     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  Tobacco Use   Smoking status: Never   Smokeless tobacco: Never  Vaping Use   Vaping Use: Never used  Substance and Sexual Activity   Alcohol use: No   Drug use: No   Sexual activity: Not Currently  Other Topics Concern   Not on file  Social History Narrative   Lives alone    From [place]   Widowed - was married 3 times starting at age 4    hx of seamtress, 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   Social Determinants of Health   Financial Resource Strain: Low Risk    Difficulty of Paying Living Expenses: Not hard at all  Food Insecurity: No Food Insecurity   Worried About Charity fundraiser in the Last Year: Never true   Arboriculturist in the Last Year: Never true  Transportation Needs: No Transportation Needs   Lack of Transportation (Medical): No   Lack of Transportation (Non-Medical): No  Physical Activity: Inactive   Days of Exercise per Week: 0 days   Minutes of Exercise per Session: 0 min  Stress: Stress Concern Present   Feeling of Stress : To some extent  Social Connections: Socially Isolated   Frequency of Communication with Friends and Family: More than three times a week   Frequency of Social Gatherings with Friends and Family: More than three times a week   Attends Religious Services: Never   Marine scientist or Organizations: No   Attends  Archivist Meetings: Never   Marital Status: Widowed  Human resources officer Violence: Not At Risk   Fear of Current or Ex-Partner: No   Emotionally Abused: No   Physically Abused: No   Sexually Abused: No    Family History:    Family History  Problem Relation Age of Onset   Diabetes Mother    Hypertension Mother    Stroke Mother    Diabetes Other    Healthy Father    Diabetes Sister    Heart disease Sister      ROS:  Constitutional: Denied fever, chills, malaise, night sweats Eyes: Denied vision change or loss Ears/Nose/Mouth/Throat: Denied ear ache, sore throat, coughing, sinus pain Cardiovascular: see HPI Respiratory: denied shortness of breath Gastrointestinal: Denied nausea, vomiting, abdominal pain, diarrhea Genital/Urinary: Denied dysuria, hematuria, urinary frequency/urgency Musculoskeletal: Denied muscle ache, joint pain, weakness Skin: Denied rash, wound Neuro: see HPI  Psych: see HPI Endocrine: history of diabetes     Physical Exam/Data:   Vitals:   12/15/20 1147 12/15/20 1200 12/15/20 1250 12/15/20 1300  BP: (!) 148/60 (!) 172/83  (!) 189/55  Pulse:  83  67  Resp:  18  18  Temp:    98.9 F (37.2 C)  TempSrc:    Oral  SpO2:  93%  94%  Weight:   122.5 kg   Height:   '5\' 3"'  (1.6 m)    No intake or output data in the 24 hours ending 12/15/20 1418 Last 3 Weights 12/15/2020 11/24/2020 11/18/2020  Weight (lbs) 270 lb 1 oz 257 lb 257 lb  Weight (kg) 122.5 kg 116.574 kg 116.574 kg     Body mass index is 47.84 kg/m.   Vitals:  Vitals:   12/15/20 1200 12/15/20 1300  BP: (!) 172/83 (!) 189/55  Pulse: 83 67  Resp: 18 18  Temp:  98.9 F (37.2 C)  SpO2: 93% 94%   General Appearance: In no apparent  distress, sitting in bed, eating lunch  HEENT: Normocephalic, atraumatic. EOMs intact.  Neck: Supple, trachea midline, no JVDs Cardiovascular: Regular rate and rhythm, normal S1-S2,  no murmur/rub/gallop Respiratory: Resting breathing unlabored, lungs  sounds clear to auscultation bilaterally, no use of accessory muscles. On room air.  No wheezes, rales or rhonchi.   Gastrointestinal: Bowel sounds positive, abdomen soft, non-tender, non-distended. Extremities: Able to move all extremities in bed without difficulty, 1- edema of BLE  Genitourinary:  genital exam not performed Musculoskeletal: Normal muscle bulk and tone, no limited range of motion, no swollen or erythematous joints Skin: Intact, warm, dry. No rashes or petechiae noted in exposed areas.  Neurologic: Alert, oriented to person, place and time. Fluent speech, no cognitive deficit, no gross focal neuro deficit Psychiatric: Anxious   EKG:  The EKG was personally reviewed and demonstrates:  Sinus rhythm with first degree AV bock, no acute ischemic changes   Telemetry:  Telemetry was personally reviewed and demonstrates:  sinus rhythm 80s with first degree AV block   Relevant CV Studies:  Event monitor on 09/13/20:  Impression: 1. No arrhythmias detected. 2. Rare ectopy.  Echocardiogram from 06/25/2016:  Moderate concentric LVH, LVEF 62%, no segmental wall motion abnormality, grade 1 DD, no significant valvular disease.    Laboratory Data:  High Sensitivity Troponin:   Recent Labs  Lab 12/15/20 0217 12/15/20 0410 12/15/20 0832  TROPONINIHS 15 22* 25*     Chemistry Recent Labs  Lab 12/15/20 0217  NA 135  K 4.0  CL 98  CO2 28  GLUCOSE 268*  BUN 11  CREATININE 1.17*  CALCIUM 9.7  GFRNONAA 47*  ANIONGAP 9    Recent Labs  Lab 12/15/20 0217  PROT 7.1  ALBUMIN 4.0  AST 29  ALT 24  ALKPHOS 47  BILITOT 1.1   Hematology Recent Labs  Lab 12/15/20 0217  WBC 8.2  RBC 4.70  HGB 13.8  HCT 42.6  MCV 90.6  MCH 29.4  MCHC 32.4  RDW 13.8  PLT 249   BNP Recent Labs  Lab 12/15/20 0217  BNP 34.0    DDimer No results for input(s): DDIMER in the last 168 hours.   Radiology/Studies:  DG Chest 2 View  Result Date: 12/15/2020 CLINICAL DATA:  Shortness  of breath EXAM: CHEST - 2 VIEW COMPARISON:  08/13/2020 FINDINGS: Cardiomegaly. Lungs clear. No effusions or edema. Aortic atherosclerosis. No acute bony abnormality. IMPRESSION: Cardiomegaly.  No active disease. Electronically Signed   By: Rolm Baptise M.D.   On: 12/15/2020 03:42   CT Angio Chest/Abd/Pel for Dissection W and/or Wo Contrast  Result Date: 12/15/2020 CLINICAL DATA:  80 year old female with chest and back pain. Asymmetric upper extremity blood pressure. Palpitations and dizziness. EXAM: CT ANGIOGRAPHY CHEST, ABDOMEN AND PELVIS TECHNIQUE: Non-contrast CT of the chest was initially obtained. Multidetector CT imaging through the chest, abdomen and pelvis was performed using the standard protocol during bolus administration of intravenous contrast. Multiplanar reconstructed images and MIPs were obtained and reviewed to evaluate the vascular anatomy. CONTRAST:  131m OMNIPAQUE IOHEXOL 350 MG/ML SOLN COMPARISON:  CT Abdomen and Pelvis 10/24/2019 and earlier. Chest radiographs 0329 hours today. FINDINGS: CTA CHEST FINDINGS Cardiovascular: Calcified coronary artery and aortic atherosclerosis. 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 Mild cardiac pulsation. Negative for thoracic aortic aneurysm or dissection. Calcified atherosclerosis involving the other proximal great vessels which appear patent. The main pulmonary artery appears  patent. Borderline to mild cardiomegaly. No pericardial effusion. Mediastinum/Nodes: Negative. No mediastinal lymphadenopathy. Thyroid is negative for age. Lungs/Pleura: Atelectatic changes to the trachea and other major airways which remain patent. There is mild mosaic attenuation in both lungs compatible with a degree of gas trapping and atelectasis. Mild focal atelectasis at the right lung base. No pleural effusion or other abnormal pulmonary opacity. Musculoskeletal: No  acute osseous abnormality identified. Ordinary degenerative changes in the spine. Review of the MIP images confirms the above findings. CTA ABDOMEN AND PELVIS FINDINGS VASCULAR Extensive Aortoiliac calcified atherosclerosis. Negative for abdominal aortic aneurysm or dissection. The major aortic branches remain patent despite extensive atherosclerosis. Bilateral iliac and visible proximal femoral arteries are patent despite plaque. Review of the MIP images confirms the above findings. NON-VASCULAR Hepatobiliary: Chronically absent gallbladder.  Negative liver. Pancreas: Negative. Spleen: Negative. Adrenals/Urinary Tract: Normal adrenal glands. Nonobstructed kidneys enhance symmetrically and appear normal. Ureters are decompressed. Mildly distended but otherwise unremarkable bladder. Stomach/Bowel: Low-density retained stool now in the large bowel. Scattered diverticulosis of the large bowel. No active inflammation identified. Appendix not delineated. No pericecal inflammation. No dilated small bowel. Terminal ileum appears within normal limits. Chronic dystrophic calcifications at the gastroesophageal junction are stable. Stomach is otherwise negative. There is a 2.5 cm duodenal diverticula in the midline with no active inflammation. No free air, free fluid or mesenteric inflammation. Lymphatic: No lymphadenopathy. Reproductive: Stable, negative. Other: No pelvic free fluid. Musculoskeletal: Chronic mild lower lumbar spondylolisthesis with advanced disc and facet degeneration. Chronic vacuum disc and vacuum facet. Chronic multifactorial lumbar spinal stenosis is associated at L4-L5. No acute osseous abnormality identified. Review of the MIP images confirms the above findings. IMPRESSION: 1. Negative for aortic dissection or aneurysm. 2. Positive for extensive atherosclerosis, including at an aberrant origin of the right subclavian artery (normal variant) which is severely stenotic due to bulky calcified plaque but  remains patent. Coronary artery and Aortic atherosclerosis (ICD10-I70.0). 3. No superimposed acute or inflammatory process identified in the chest, abdomen, or pelvis. Diverticulosis of the large bowel and duodenum without active inflammation. 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 Electronically Signed   By: Genevie Ann M.D.   On: 12/15/2020 04:10     Assessment and Plan:   Elevated troponin -Patient has no exertional chest pain/pressure, SOB; related presenting symptoms to her uncontrolled anxiety and panic attacks  -High sensitive troponin 15 >22 > 25 -EKG without acute ischemic changes  -CTA today revealed calcified coronary artery and aortic atherosclerosis -Not consistent with ACS, likely demand ischemia from hypertensive urgency, no further inpatient workup needed  - outpatient stress test can be pursued given her risk factors if chest pain occurs  - she states she has arranged transportation to [place], wishes to go back to Dr Rockey Situ   Accelerated hypertension -Blood pressure is elevated up to 196/63 over the past 24 hours -Exacerbated by anxiety/panic attack -Home regimen resumed including amlodipine 10 mg daily, clonidine 0.2 mg 3 times daily, spironolactone 25 mg daily, torsemide 20 mg daily, she is compliant with her medications, morning spironolactone not given, will see how her BP trends once resume all home meds   Chronic diastolic heart failure -Chest imaging without acute pulmonary edema -Clinically euvolemic  - repeat Echo per IM is pending  - recommend optimal BP control - discussed low salt diet, fluid restriction as she drinks 1 gallon daily, healthy diet   Heart palpitation - 3-day event monitor from March revealed no arrhythmia - this is likely due to  uncontrolled anxiety   Anxiety/panic attack - management per IM  - please refer to PSY outpatient for counseling   PAD Bilateral carotid stenosis with right subclavian steal -Asymptomatic, follow  vascular surgery regularly -Continue aspirin 81 and statin  CKD stage III -renal index near baseline  Type 2 diabetes - last A1C 9.8% uncontrolled, she worried about hypoglycemia at night  - follows Endo, advised patient to discuss with Endo with her concern and further adjustment if needed , will repeat A1C per patient wish  - managed per IM here   Hyperlipidemia -continue statin, LDL was 71 on 03/23/2020, will update lipid panel    Risk Assessment/Risk Scores:  {  New York Heart Association (NYHA) Functional Class NYHA Class II        For questions or updates, please contact [facility] Please consult [email or web address] for contact info under    Signed, Margie Billet, NP  12/15/2020 2:18 PM   Patient seen and examined.  Agree with above documentation.  Ms. Camille Bal is an 80 year old female with a history of C3FV, chronic diastolic heart failure, PAD, carotid artery disease, right subclavian steal we are consulted by Dr. Donalda Ewings for evaluation of elevated troponin.  She previously followed with Dr. Rockey Situ in [place] but recently established with Dr. Audie Box.  Most recently seen on 09/30/2020.  She presented to the ED today with palpitations and lightheadedness.  States that it felt like a panic attack.  Noted to have SBP up to 200.  In the ED, initial vital signs notable for BP 187/79, pulse 106, SPO2 98% on room air.  Labs notable for creatinine 1.17, potassium 4.0, albumin 4.0, BNP 34, troponin 15 > 22 > 25, WBC 8.2, hemoglobin 13.8, platelets 249, TSH 3.4, LDL 40.  EKG shows sinus rhythm with first-degree AV block, rate 99, LVH, poor R wave progression, T wave inversions in 1, aVL.  Chest x-ray unremarkable.  CTA chest showed no aortic dissection or aneurysm but extensive atherosclerosis in the coronary arteries and aorta.  On exam, patient is alert and oriented, regular rate and rhythm, no murmurs, lungs CTAB, no LE edema or JVD.  For his troponin elevation, troponins are mildly elevated  and not consistent with an acute coronary syndrome.  She denies any chest pain.  Echocardiogram was ordered, will follow this up but do not anticipate any further cardiac work-up.  Suspect likely demand ischemia in setting of uncontrolled hypertension.  Restarting home BP meds.  Donato Heinz, MD

## 2020-12-15 NOTE — H&P (Addendum)
History and Physical    Alexis Farmer YIA:[id number] DOB: 05/04/1941 DOA: 12/15/2020  Referring MD/NP/PA:  Marvis Repress, MD PCP: McLean-Scocuzza, Nino Glow, MD  Consultants: Eleonore Chiquito, MD-cardiology Murlean Iba, MD-nephrology Jamelle Haring, MD-vascular surgery Felizardo Hoffmann, D.P.M.-podiatry Patient coming from: home via EMS  Chief Complaint: Anxiety  I have personally briefly reviewed patient's old medical records in [place]   HPI: Alexis Farmer is a 80 y.o. female with medical history significant of HTN, HLD, diastolic CHF last EF 70% with grade 1 diastolic dysfunction, PVD, CAD, DM type II, CKD stage IIIa, and morbid obesity presents with complaints of anxiety.  Symptoms started this morning around 12:30 am after she had gotten up to go to go to the bathroom.  Patient noted that the ringing that she normally has in her right ear that sounds like a seashell had increased in intensity.  At that time she states she could hear/ feel her heart racing, felt lightheaded, and flushed.  Thought possibly her blood sugars could be low, but it was elevated into the 200s when she checked.  She is assumed thereafter that was having an anxiety attack/panic attack as she had previously in the past, and had tried breathing exercises she had been given previously to help for about an hour without any improvement.  Prior to any of the symptoms starting she had been sitting on the couch watching westerns and earlier that evening had been informed that one of her friends sister had passed away while sitting on the couch.  She admits to thinking about this on she was sitting on the couch last night as well as her brother who is currently in the hospital which may have provoked her symptoms.  Patient reports associated symptoms of urinary frequency, urgency, some mild nausea, leg swelling, and lightheadedness.  Denies any fever, shortness of breath, chest pain, vomiting, or diarrhea.  She was  scheduled to see her PCP this Friday to discuss possibly being started on a medication for anxiety.  She reports that she does not want to affect her kidneys or her heart.  She had previously been prescribed Celexa, but read about increased suicidality risk and never took the medicine.  Upon EMS arrival patient blood pressure on the right side was in the 120s versus left side in the 200s with heart rates in the 110's and O2 saturations 97% on room air.  CBG at that time was 328.  Patient notes that she is also been having these intermittent pains with numbness that radiate down her right arm that usually occurred 1-2 times day.  She has been recently seen by vascular surgery who she says  recommended her to notify them if the pains became constant.    ED Course: Upon admission into the emergency department patient was seen to be afebrile, pulse 74-1 06, respirations 14-32, blood pressures 115/98-196/63, and O2 saturations maintained on room air.  Labs significant for glucose 268, BNP 34, and troponin 15-> 22.  Chest x-ray noted cardiomegaly without any acute abnormality.  CT angiogram of the chest patient had been given 500 mL normal saline IV fluids at 0.5 mg of Ativan.  TRH called to admit.  Review of Systems  Constitutional:  Negative for fever.  HENT:  Positive for tinnitus. Negative for congestion.   Eyes:  Negative for pain.  Respiratory:  Negative for cough and shortness of breath.   Cardiovascular:  Positive for palpitations and leg swelling. Negative for chest pain.  Gastrointestinal:  Positive  for nausea. Negative for abdominal pain, diarrhea and vomiting.  Genitourinary:  Positive for frequency and urgency.  Musculoskeletal:  Positive for myalgias. Negative for falls.  Skin:  Negative for rash.  Neurological:  Positive for dizziness. Negative for loss of consciousness.  Psychiatric/Behavioral:  Negative for memory loss and substance abuse. The patient is nervous/anxious.    Past  Medical History:  Diagnosis Date   Arthritis    Chickenpox    Diabetes mellitus without complication ([place])    one elevated reading/ no treatment   Diverticulitis    GI bleed    High cholesterol    History of blood transfusion    Hypertension    Renal insufficiency     Past Surgical History:  Procedure Laterality Date   APPENDECTOMY     CHOLECYSTECTOMY     ECTOPIC PREGNANCY SURGERY     EYE SURGERY     bilateral cataracts   EYE SURGERY     02/11/2019 repair hole in right eye    gallbladder      HYSTEROSCOPY WITH D & C N/A 10/26/2016   Procedure: DILATATION AND CURETTAGE /HYSTEROSCOPY;  Surgeon: Benjaman Kindler, MD;  Location: [facility] ORS;  Service: Gynecology;  Laterality: N/A;   HYSTEROSCOPY WITH D & C N/A 07/07/2018   Procedure: DILATATION AND CURETTAGE /HYSTEROSCOPY;  Surgeon: Benjaman Kindler, MD;  Location: [facility] ORS;  Service: Gynecology;  Laterality: N/A;   THYROIDECTOMY, PARTIAL       reports that she has never smoked. She has never used smokeless tobacco. She reports that she does not drink alcohol and does not use drugs.  Allergies  Allergen Reactions   Jardiance [Empagliflozin]    Penicillin V     Other reaction(s): rash   Penicillins Rash    Has patient had a PCN reaction causing immediate rash, facial/tongue/throat swelling, SOB or lightheadedness with hypotension: Yes Has patient had a PCN reaction causing severe rash involving mucus membranes or skin necrosis: No Has patient had a PCN reaction that required hospitalization No Has patient had a PCN reaction occurring within the last 10 years: Yes If all of the above answers are "NO", then may proceed with Cephalosporin use.     Family History  Problem Relation Age of Onset   Diabetes Mother    Hypertension Mother    Stroke Mother    Diabetes Other    Healthy Father    Diabetes Sister    Heart disease Sister     Prior to Admission medications   Medication Sig Start Date End Date Taking? Authorizing  Provider  acetaminophen (TYLENOL) 500 MG tablet Take 500 mg by mouth every 6 (six) hours as needed.     [provider]  amLODipine (NORVASC) 10 MG tablet Take 1 tablet (10 mg total) by mouth daily. 03/24/20   McLean-Scocuzza, Nino Glow, MD  Ascorbic Acid (VITAMIN C PO) Take 500 mg by mouth daily.    [provider]  blood glucose meter kit and supplies KIT Accu chek, Dx code E11.65, check 3 times daily 07/26/17   Leone Haven, MD  cloNIDine (CATAPRES) 0.2 MG tablet Take 1 tablet (0.2 mg total) by mouth 3 (three) times daily. 06/01/20   McLean-Scocuzza, Nino Glow, MD  glucose blood test strip Use as instructed tid  Accuchek Guide 06/03/20   McLean-Scocuzza, Nino Glow, MD  insulin NPH Human (NOVOLIN N) 100 UNIT/ML injection 25 units each morning, and 15 units each evening. 10/17/20   Renato Shin, MD  insulin regular (NOVOLIN  R) 100 units/mL injection Inject 0-0.08 mLs (0-8 Units total) into the skin 3 (three) times daily before meals. 10/17/20   Renato Shin, MD  Insulin Syringe-Needle U-100 (B-D INS SYR ULTRAFINE .5CC/30G) 30G X 1/2" 0.5 ML MISC 1 Device by Does not apply route daily. Up to 5x per day with insulin bid (NPH) and tid (Regular) 09/26/20   McLean-Scocuzza, Nino Glow, MD  meclizine (ANTIVERT) 12.5 MG tablet Take 1 tablet (12.5 mg total) by mouth daily as needed for dizziness. 09/02/19   McLean-Scocuzza, Nino Glow, MD  Multiple Vitamin (MULTIVITAMIN WITH MINERALS) TABS tablet Take 1 tablet by mouth daily.    [provider]  mupirocin ointment (BACTROBAN) 2 % Apply 1 application topically 2 (two) times daily. Right index finger and left forearm 03/23/20   McLean-Scocuzza, Nino Glow, MD  polyethylene glycol powder (GLYCOLAX/MIRALAX) 17 GM/SCOOP powder Take 17 g by mouth daily as needed for moderate constipation or severe constipation. Can take up to 2x per day prn. Mix with 8 ounces of liquid 11/10/19   McLean-Scocuzza, Nino Glow, MD  rosuvastatin (CRESTOR) 20 MG tablet TAKE 1  TABLET BY MOUTH EVERY DAY 12/05/20   O'Neal, Cassie Freer, MD  spironolactone (ALDACTONE) 25 MG tablet Take 1 tablet (25 mg total) by mouth daily. 01/22/20   McLean-Scocuzza, Nino Glow, MD  torsemide (DEMADEX) 20 MG tablet Take 2 tablets (40 mg) twice daily for 5 days, then reduce to 1 tablet (20 mg) daily 09/30/20   O'Neal, Cassie Freer, MD    Physical Exam:  Constitutional: Elderly female who appears to be in no acute distress at this time. Vitals:   12/15/20 0600 12/15/20 0615 12/15/20 0630 12/15/20 0645  BP: (!) 166/66 (!) 187/71 (!) 145/84 (!) 163/68  Pulse: 80 87 86 84  Resp: 17 14 (!) 32 15  Temp:      TempSrc:      SpO2: 100% 99% 99% 100%   Eyes: PERRL, lids and conjunctivae normal ENMT: Mucous membranes are moist. Posterior pharynx clear of any exudate or lesions.   Neck: normal, supple, no masses, no thyromegaly Respiratory: clear to auscultation bilaterally, no wheezing, no crackles. Normal respiratory effort. No accessory muscle use.  Cardiovascular: Regular rate and rhythm, no murmurs / rubs / gallops.  At least +1 pitting lower extremity edema. 2+ pedal pulses. No carotid bruits.  Abdomen: no tenderness, no masses palpated. No hepatosplenomegaly. Bowel sounds positive.  Musculoskeletal: no clubbing / cyanosis. No joint deformity upper and lower extremities. Good ROM, no contractures. Normal muscle tone.  Skin: no rashes, lesions, ulcers. No induration Neurologic: CN 2-12 grossly intact.  Strength 5/5 in all 4 extremities. Psychiatric: Normal judgment and insight. Alert and oriented x 3.  Anxious mood.     Labs on Admission: I have personally reviewed following labs and imaging studies  CBC: Recent Labs  Lab 12/15/20 0217  WBC 8.2  NEUTROABS 3.1  HGB 13.8  HCT 42.6  MCV 90.6  PLT 177   Basic Metabolic Panel: Recent Labs  Lab 12/15/20 0217  NA 135  K 4.0  CL 98  CO2 28  GLUCOSE 268*  BUN 11  CREATININE 1.17*  CALCIUM 9.7   GFR: CrCl cannot be  calculated (Unknown ideal weight.). Liver Function Tests: Recent Labs  Lab 12/15/20 0217  AST 29  ALT 24  ALKPHOS 47  BILITOT 1.1  PROT 7.1  ALBUMIN 4.0   No results for input(s): LIPASE, AMYLASE in the last 168 hours. No results for input(s): AMMONIA in  the last 168 hours. Coagulation Profile: No results for input(s): INR, PROTIME in the last 168 hours. Cardiac Enzymes: No results for input(s): CKTOTAL, CKMB, CKMBINDEX, TROPONINI in the last 168 hours. BNP (last 3 results) No results for input(s): PROBNP in the last 8760 hours. HbA1C: No results for input(s): HGBA1C in the last 72 hours. CBG: No results for input(s): GLUCAP in the last 168 hours. Lipid Profile: No results for input(s): CHOL, HDL, LDLCALC, TRIG, CHOLHDL, LDLDIRECT in the last 72 hours. Thyroid Function Tests: No results for input(s): TSH, T4TOTAL, FREET4, T3FREE, THYROIDAB in the last 72 hours. Anemia Panel: No results for input(s): VITAMINB12, FOLATE, FERRITIN, TIBC, IRON, RETICCTPCT in the last 72 hours. Urine analysis:    Component Value Date/Time   COLORURINE YELLOW 03/23/2020 1301   APPEARANCEUR CLEAR 03/23/2020 1301   APPEARANCEUR Clear 10/22/2018 0844   LABSPEC 1.014 03/23/2020 1301   PHURINE 7.0 03/23/2020 1301   GLUCOSEU NEGATIVE 03/23/2020 1301   HGBUR 1+ (A) 03/23/2020 1301   BILIRUBINUR NEGATIVE 03/09/2020 1022   BILIRUBINUR Negative 10/22/2018 0844   KETONESUR NEGATIVE 03/23/2020 1301   PROTEINUR 1+ (A) 03/23/2020 1301   UROBILINOGEN 0.2 03/09/2020 1022   NITRITE NEGATIVE 03/23/2020 1301   LEUKOCYTESUR NEGATIVE 03/23/2020 1301   Sepsis Labs: Recent Results (from the past 240 hour(s))  Resp Panel by RT-PCR (Flu A&B, Covid) Nasopharyngeal Swab     Status: None   Collection Time: 12/15/20  6:17 AM   Specimen: Nasopharyngeal Swab; Nasopharyngeal(NP) swabs in vial transport medium  Result Value Ref Range Status   SARS Coronavirus 2 by RT PCR NEGATIVE NEGATIVE Final    Comment:  (NOTE) SARS-CoV-2 target nucleic acids are NOT DETECTED.  The SARS-CoV-2 RNA is generally detectable in upper respiratory specimens during the acute phase of infection. The lowest concentration of SARS-CoV-2 viral copies this assay can detect is 138 copies/mL. A negative result does not preclude SARS-Cov-2 infection and should not be used as the sole basis for treatment or other patient management decisions. A negative result may occur with  improper specimen collection/handling, submission of specimen other than nasopharyngeal swab, presence of viral mutation(s) within the areas targeted by this assay, and inadequate number of viral copies(<138 copies/mL). A negative result must be combined with clinical observations, patient history, and epidemiological information. The expected result is Negative.  Fact Sheet for Patients:  EntrepreneurPulse.com.au  Fact Sheet for Healthcare Providers:  IncredibleEmployment.be  This test is no t yet approved or cleared by the [place] FDA and  has been authorized for detection and/or diagnosis of SARS-CoV-2 by FDA under an Emergency Use Authorization (EUA). This EUA will remain  in effect (meaning this test can be used) for the duration of the COVID-19 declaration under Section 564(b)(1) of the Act, 21 U.S.C.section 360bbb-3(b)(1), unless the authorization is terminated  or revoked sooner.       Influenza A by PCR NEGATIVE NEGATIVE Final   Influenza B by PCR NEGATIVE NEGATIVE Final    Comment: (NOTE) The Xpert Xpress SARS-CoV-2/FLU/RSV plus assay is intended as an aid in the diagnosis of influenza from Nasopharyngeal swab specimens and should not be used as a sole basis for treatment. Nasal washings and aspirates are unacceptable for Xpert Xpress SARS-CoV-2/FLU/RSV testing.  Fact Sheet for Patients: EntrepreneurPulse.com.au  Fact Sheet for Healthcare  Providers: IncredibleEmployment.be  This test is not yet approved or cleared by the [place] FDA and has been authorized for detection and/or diagnosis of SARS-CoV-2 by FDA under an Emergency Use Authorization (EUA).  This EUA will remain in effect (meaning this test can be used) for the duration of the COVID-19 declaration under Section 564(b)(1) of the Act, 21 U.S.C. section 360bbb-3(b)(1), unless the authorization is terminated or revoked.  Performed at [facility], [place] [address] [place], [place] [address]      Radiological Exams on Admission: DG Chest 2 View  Result Date: 12/15/2020 CLINICAL DATA:  Shortness of breath EXAM: CHEST - 2 VIEW COMPARISON:  08/13/2020 FINDINGS: Cardiomegaly. Lungs clear. No effusions or edema. Aortic atherosclerosis. No acute bony abnormality. IMPRESSION: Cardiomegaly.  No active disease. Electronically Signed   By: Rolm Baptise M.D.   On: 12/15/2020 03:42   CT Angio Chest/Abd/Pel for Dissection W and/or Wo Contrast  Result Date: 12/15/2020 CLINICAL DATA:  80 year old female with chest and back pain. Asymmetric upper extremity blood pressure. Palpitations and dizziness. EXAM: CT ANGIOGRAPHY CHEST, ABDOMEN AND PELVIS TECHNIQUE: Non-contrast CT of the chest was initially obtained. Multidetector CT imaging through the chest, abdomen and pelvis was performed using the standard protocol during bolus administration of intravenous contrast. Multiplanar reconstructed images and MIPs were obtained and reviewed to evaluate the vascular anatomy. CONTRAST:  14m OMNIPAQUE IOHEXOL 350 MG/ML SOLN COMPARISON:  CT Abdomen and Pelvis 10/24/2019 and earlier. Chest radiographs 0329 hours today. FINDINGS: CTA CHEST FINDINGS Cardiovascular: Calcified coronary artery and aortic atherosclerosis. 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 Mild cardiac  pulsation. Negative for thoracic aortic aneurysm or dissection. Calcified atherosclerosis involving the other proximal great vessels which appear patent. The main pulmonary artery appears patent. Borderline to mild cardiomegaly. No pericardial effusion. Mediastinum/Nodes: Negative. No mediastinal lymphadenopathy. Thyroid is negative for age. Lungs/Pleura: Atelectatic changes to the trachea and other major airways which remain patent. There is mild mosaic attenuation in both lungs compatible with a degree of gas trapping and atelectasis. Mild focal atelectasis at the right lung base. No pleural effusion or other abnormal pulmonary opacity. Musculoskeletal: No acute osseous abnormality identified. Ordinary degenerative changes in the spine. Review of the MIP images confirms the above findings. CTA ABDOMEN AND PELVIS FINDINGS VASCULAR Extensive Aortoiliac calcified atherosclerosis. Negative for abdominal aortic aneurysm or dissection. The major aortic branches remain patent despite extensive atherosclerosis. Bilateral iliac and visible proximal femoral arteries are patent despite plaque. Review of the MIP images confirms the above findings. NON-VASCULAR Hepatobiliary: Chronically absent gallbladder.  Negative liver. Pancreas: Negative. Spleen: Negative. Adrenals/Urinary Tract: Normal adrenal glands. Nonobstructed kidneys enhance symmetrically and appear normal. Ureters are decompressed. Mildly distended but otherwise unremarkable bladder. Stomach/Bowel: Low-density retained stool now in the large bowel. Scattered diverticulosis of the large bowel. No active inflammation identified. Appendix not delineated. No pericecal inflammation. No dilated small bowel. Terminal ileum appears within normal limits. Chronic dystrophic calcifications at the gastroesophageal junction are stable. Stomach is otherwise negative. There is a 2.5 cm duodenal diverticula in the midline with no active inflammation. No free air, free fluid or  mesenteric inflammation. Lymphatic: No lymphadenopathy. Reproductive: Stable, negative. Other: No pelvic free fluid. Musculoskeletal: Chronic mild lower lumbar spondylolisthesis with advanced disc and facet degeneration. Chronic vacuum disc and vacuum facet. Chronic multifactorial lumbar spinal stenosis is associated at L4-L5. No acute osseous abnormality identified. Review of the MIP images confirms the above findings. IMPRESSION: 1. Negative for aortic dissection or aneurysm. 2. 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 Coronary artery and Aortic atherosclerosis (ICD10-I70.0). 3. No superimposed acute or inflammatory process identified in the chest, abdomen, or pelvis. Diverticulosis of the large bowel and duodenum without active inflammation. 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 Electronically Signed   By: Genevie Ann M.D.   On: 12/15/2020 04:10    EKG: Independently reviewed.  Sinus rhythm at 99 bpm with first-degree heart block and T wave abnormality, but similar to previous EKG from 07/2020  Assessment/Plan Anxiety/panic attacks palpitations: Acute on chronic.  Patient presents with complaints of anxiety and palpitations.  Was able to talk with patient's primary care provider Dr. Aundra Dubin who noted patient had previously been on Zoloft 50 mg in the past, but had also tried to prescribe the patient Celexa 10 mg back in March: But the patient never picked up the prescription.  Records note patient evaluated with a Holter monitor in 08/2020 which revealed sinus bradycardia with heart rates as low as 57 bpm and sinus tachycardia with heart rates 142 bpm, but no significant arrhythmias and rare ectopy. -Admit to medical telemetry bed -Check TSH -Discussed with patient's primary care provider Dr. Terese Door who recommended starting patient  on Celexa 10 mg for which patient now agrees after explaining blackbox warning ofincreased suicidality.  Explained that medication needs 4 to 6 weeks to take effect.  Dr. Terese Door also recommended adding on low-dose Ativan 0.25 mg in the acute phase. -Follow-up telemetry overnight  Elevated troponin: Acute.  Labs significant for high-sensitivity troponin 15-> 22.  Similar rise noted during evaluation in 07/2020. Patient denies any complaints of chest pain. -Continue to trend cardiac trend cardiac enzymes -Check echocardiogram -Appreciate cardiology consultative services we will follow-up for any further recommendations    Hypertensive urgency: Acute.  On admission blood pressure elevated up to 196/63.  Home medication regimen includes amlodipine 10 mg daily, clonidine 0.2 mg 3 times daily, spironolactone 25 mg daily, torsemide 20 mg daily -Resume home blood pressure regimen -Using IV as needed for elevated blood pressures  Subclavian steal syndrome: Patient follows with with vascular surgery in outpatient setting.  Last seen on 10/04/2020 and had normal ABI/TBI's and 1-39% stenosis on the right and left carotid arteries. -Continue outpatient follow-up with vascular surgery   Diabetes mellitus type 2, uncontrolled: On admission patient's glucose elevated up to 268 without elevated anion gap.  Last hemoglobin A1c was 9.8 on 10/2020.  Home insulin regiment includes Novolin 15 units  2-3 times before meals, along with NPH 25 units every morning and 15 units q. at bedtime. -Hypoglycemic protocols -Continue home NPH regimen -CBGs before every meal with sensitive SSI -Continue outpatient follow-up and management  Diastolic congestive heart failure: Chronic.  On exam patient with 1+ lower extremity edema, but clinically appears to be euvolemic.  BNP 34.  Last EF noted to be around 62% with grade 1 diastolic dysfunction back in 2018. -Strict intake and output and daily weight -Continue torsemide  and spironolactone  Chronic kidney disease stage IIIa: Creatinine 1.17 which appears around patient's baseline. -Recheck BMP in a.m.  Peripheral artery disease -Continue atorvastatin   DVT prophylaxis: Lovenox Code Status: Full Family Communication: Patient stated that she had already updated her family. Disposition Plan: Likely home Consults called: Cardiology and PCP(over the phone) Admission status: Observations  Norval Morton MD [facility]   If 7PM-7AM, please contact night-coverage   12/15/2020, 7:26 AM

## 2020-12-15 NOTE — Progress Notes (Signed)
  Echocardiogram 2D Echocardiogram has been performed.  Alexis Farmer 12/15/2020, 12:48 PM

## 2020-12-15 NOTE — ED Triage Notes (Signed)
Pt brought to ED by EMS from home for c/o palpitation and dizziness. Per EMS pt calls frequently for anxiety but today  she started having dizziness hot flushes and different in BP right side on the 120 vs. Left side 200's HR 110, R-20 SPO2 97% RA. CBG 328.

## 2020-12-15 NOTE — ED Notes (Signed)
Nurse report given to Clelia Schaumann, RN at this time

## 2020-12-15 NOTE — Progress Notes (Signed)
Patient received to the unit. Patient is alert and oriented x4. Vital signs are stable.iv in place and running fluid. Skin assessment done with another nurse. Given instructions about call bell and phone. Bed in low position and call bell in place.

## 2020-12-15 NOTE — ED Provider Notes (Deleted)
Emergency Medicine Provider Triage Evaluation Note  Alexis Farmer , a 80 y.o. female  was evaluated in triage.  Pt reports that she has a history of anxiety and panic attacks.  She has a history of constant tinnitus in the right ear that sounds like the ocean.  However, before she has a panic attack tinnitus will increase in intensity.  At approximately 00:30, the patient was sitting on the couch watching TV when she noticed that the tinnitus in her right ear started to increase.  She is on felt her heart start to race and felt lightheaded, consistent with her history of panic attacks.  She called EMS.  EMS reports that she has a history of frequent calls to EMS for anxiety.  However, today she reported that in addition to her usual panic attack symptoms that she also felt hot all over.  When EMS arrived to evaluate her, the patient had different blood pressures in the upper extremities.  Systolic pressure was 123456 on the right versus 200s on the left.  The patient reports that EMS continue to recheck her blood pressure and evaluate her over the course of an hour.  When her pressures and vital signs did not change, they brought her to the emergency department for further evaluation.  She reports that she feels back to baseline other than she does continue to have more intense tinnitus in the right ear.  She denies numbness, weakness, shortness of breath, chest pain, back pain, visual changes, paresthesias, headache, abdominal pain, nausea, vomiting, syncope, or seizure-like activity, or chills.   Review of Systems  Positive: Tinnitus, palpitations, lightheadedness, feeling warm all over Negative: Numbness, weakness, shortness of breath, chest pain, back pain, visual changes, paresthesias, headaches, abdominal pain, nausea, vomiting, syncope, seizure-like activity, chills  Physical Exam  BP (!) 154/66 (BP Location: Right Leg)   Pulse 93   Temp 99.1 F (37.3 C) (Oral)   Resp 16   SpO2 98%  Gen:    Awake, no distress   Resp:  Normal effort  MSK:   Moves extremities without difficulty  Other:  Tachycardic.  3+ bounding pulses to the left upper extremity 1+ pulse noted to the right upper extremity.  Bilateral upper extremities are well-perfused.  She has good capillary refill.  2+ DP pulses to the bilateral lower extremities.  She has pitting edema to the lower extremities.  Medical Decision Making  Medically screening exam initiated at 2:45 AM.  Appropriate orders placed.  Jacinda Ogaz was informed that the remainder of the evaluation will be completed by another provider, this initial triage assessment does not replace that evaluation, and the importance of remaining in the ED until their evaluation is complete.  Labs and imaging have been ordered.  Patient is hemodynamically stable and in no acute distress at this time.  She will require further work-up and evaluation in the emergency department.   Joline Maxcy A, PA-C 12/15/20 [phone]

## 2020-12-15 NOTE — ED Provider Notes (Signed)
[facility] EMERGENCY DEPARTMENT Provider Note   CSN: [id number] Arrival date & time: 12/15/20  0205     History Chief Complaint  Patient presents with   Palpitations    Alexis Farmer is a 80 y.o. 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  Pt reports that she has a history of anxiety and panic attacks.  She has a history of constant tinnitus in the right ear that sounds like the ocean.  However, before she has a panic attack tinnitus will increase in intensity.  At approximately 00:30, the patient was sitting on the couch watching TV when she noticed that the tinnitus in her right ear started to increase.  She is on felt her heart start to race and felt lightheaded, consistent with her history of panic attacks.  She called EMS.  EMS reports that she has a history of frequent calls to EMS for anxiety.  However, today she reported that in addition to her usual panic attack symptoms that she also felt hot all over.  When EMS arrived to evaluate her, the patient had different blood pressures in the upper extremities.  Systolic pressure was 283 on the right versus 200s on the left.  The patient reports that EMS continue to recheck her blood pressure and evaluate her over the course of an hour.  When her pressures and vital signs did not change, they brought her to the emergency department for further evaluation.   She reports that she feels back to baseline other than she does continue to have more intense tinnitus in the right ear.  She denies numbness, weakness, shortness of breath, chest pain, back pain, visual changes, paresthesias, headache,  abdominal pain, nausea, vomiting, syncope, or seizure-like activity, or chills.  No treatment prior to arrival.  Patient states that she was previously told that her subclavian artery is "crooked".  Reports that she has been more stressed today after she learned of the death of a friend who died and the friend's home sitting on her couch.  She reports that she has also been more stressed about her brother being hospitalized for the last week.  The history is provided by medical records and the patient. No language interpreter was used.      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 ([place]) 05/30/2020   Retinopathy 05/30/2020   Primary hypertension 03/25/2020   Multinodular goiter 02/04/2020   Peripheral edema 12/22/2019   Proteinuria 12/22/2019   Constipation 11/17/2019   Recurrent falls 08/13/2019   Physical deconditioning 08/13/2019   Abdominal aortic atherosclerosis ([place]) 07/24/2019   Morbid obesity with BMI of  45.0-49.9, adult ([employer]) 06/09/2019   Hypercalcemia 03/18/2019   Round hole, unspecified eye 02/06/2019   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   Disorder of rotator cuff 06/24/2018   Abnormal gait 03/26/2018   Fall 03/26/2018   Arthritis 03/26/2018   Thyroid nodule 01/20/2018   Dysuria 03/22/2017   Cervical spondylosis with radiculopathy 01/26/2017   Rectal hemorrhage 07/29/2016    Carotid artery stenosis 07/19/2016   PAD (peripheral artery disease) ([place]) 07/19/2016   Swelling of lower leg 07/19/2016   Bradycardia 07/17/2016   Postmenopausal bleeding 07/17/2016   Chronic diastolic heart failure ([place]) 05/31/2016   Cough 01/25/2016   Chronic fatigue 11/15/2015   Depression, recurrent ([place]) 10/13/2015   Osteoarthritis 10/13/2015   Vertigo 10/13/2015   Anxiety 09/13/2015   Gastrointestinal hemorrhage 08/03/2015   Hyperglycemia due to type 2 diabetes mellitus ([place]) 03/11/2015   Mixed hyperlipidemia 08/26/2014   Essential hypertension 04/08/2014   Diabetes mellitus type II, uncontrolled ([place]) 04/08/2014   Chronic kidney disease, stage 2 (mild) 04/08/2014   Edema of foot 04/08/2014   Morbid obesity ([place]) 04/08/2014   Low back pain 04/08/2014   Diabetic polyneuropathy ([place]) 04/08/2014   Type 2 diabetes mellitus with other diabetic kidney complication ([place]) 97/07/6376    Past Surgical History:  Procedure Laterality Date   APPENDECTOMY     CHOLECYSTECTOMY     ECTOPIC PREGNANCY SURGERY     EYE SURGERY     bilateral cataracts   EYE SURGERY     02/11/2019 repair hole in right eye    gallbladder      HYSTEROSCOPY WITH D & C N/A 10/26/2016   Procedure: DILATATION AND CURETTAGE /HYSTEROSCOPY;  Surgeon: Benjaman Kindler, MD;  Location: [facility] ORS;  Service: Gynecology;  Laterality: N/A;   HYSTEROSCOPY WITH D & C N/A 07/07/2018   Procedure: DILATATION AND CURETTAGE /HYSTEROSCOPY;  Surgeon: Benjaman Kindler, MD;  Location: [facility] ORS;  Service: Gynecology;  Laterality: N/A;   THYROIDECTOMY, PARTIAL       OB History   No obstetric history on file.     Family History  Problem Relation Age of Onset   Diabetes Mother    Hypertension Mother    Stroke Mother    Diabetes Other    Healthy Father    Diabetes Sister    Heart disease Sister     Social History   Tobacco Use   Smoking status: Never   Smokeless tobacco: Never  Vaping Use   Vaping Use: Never used   Substance Use Topics   Alcohol use: No   Drug use: No    Home Medications Prior to Admission medications   Medication Sig Start Date End Date Taking? Authorizing Provider  acetaminophen (TYLENOL) 500 MG tablet Take 500 mg by mouth every 6 (six) hours as needed.     [provider]  amLODipine (NORVASC) 10 MG tablet Take 1 tablet (10 mg total) by mouth daily. 03/24/20   McLean-Scocuzza, Nino Glow, MD  Ascorbic Acid (VITAMIN C PO) Take 500 mg by mouth daily.    [provider]  blood glucose meter kit and supplies KIT Accu chek, Dx code E11.65, check 3 times daily 07/26/17   Leone Haven, MD  cloNIDine (CATAPRES) 0.2 MG tablet Take 1 tablet (0.2 mg total) by mouth 3 (three) times daily. 06/01/20   McLean-Scocuzza, Nino Glow, MD  glucose blood test strip Use as instructed tid  Accuchek Guide 06/03/20   McLean-Scocuzza, Nino Glow, MD  insulin NPH Human (NOVOLIN  N) 100 UNIT/ML injection 25 units each morning, and 15 units each evening. 10/17/20   Renato Shin, MD  insulin regular (NOVOLIN R) 100 units/mL injection Inject 0-0.08 mLs (0-8 Units total) into the skin 3 (three) times daily before meals. 10/17/20   Renato Shin, MD  Insulin Syringe-Needle U-100 (B-D INS SYR ULTRAFINE .5CC/30G) 30G X 1/2" 0.5 ML MISC 1 Device by Does not apply route daily. Up to 5x per day with insulin bid (NPH) and tid (Regular) 09/26/20   McLean-Scocuzza, Nino Glow, MD  meclizine (ANTIVERT) 12.5 MG tablet Take 1 tablet (12.5 mg total) by mouth daily as needed for dizziness. 09/02/19   McLean-Scocuzza, Nino Glow, MD  Multiple Vitamin (MULTIVITAMIN WITH MINERALS) TABS tablet Take 1 tablet by mouth daily.    [provider]  mupirocin ointment (BACTROBAN) 2 % Apply 1 application topically 2 (two) times daily. Right index finger and left forearm 03/23/20   McLean-Scocuzza, Nino Glow, MD  polyethylene glycol powder (GLYCOLAX/MIRALAX) 17 GM/SCOOP powder Take 17 g by mouth daily as needed for moderate constipation  or severe constipation. Can take up to 2x per day prn. Mix with 8 ounces of liquid 11/10/19   McLean-Scocuzza, Nino Glow, MD  rosuvastatin (CRESTOR) 20 MG tablet TAKE 1 TABLET BY MOUTH EVERY DAY 12/05/20   O'Neal, Cassie Freer, MD  spironolactone (ALDACTONE) 25 MG tablet Take 1 tablet (25 mg total) by mouth daily. 01/22/20   McLean-Scocuzza, Nino Glow, MD  torsemide (DEMADEX) 20 MG tablet Take 2 tablets (40 mg) twice daily for 5 days, then reduce to 1 tablet (20 mg) daily 09/30/20   O'Neal, Cassie Freer, MD    Allergies    Jardiance [empagliflozin], Penicillin v, and Penicillins  Review of Systems   Review of Systems  Constitutional:  Negative for activity change, chills, diaphoresis, fatigue and fever.  HENT:  Positive for tinnitus. Negative for congestion, sinus pressure and sinus pain.   Eyes:  Negative for visual disturbance.  Respiratory:  Negative for cough and shortness of breath.   Cardiovascular:  Positive for palpitations. Negative for chest pain.  Gastrointestinal:  Negative for abdominal pain, constipation, diarrhea, nausea and vomiting.  Genitourinary:  Negative for dysuria.  Musculoskeletal:  Negative for arthralgias, back pain, myalgias, neck pain and neck stiffness.  Skin:  Negative for color change and rash.  Allergic/Immunologic: Negative for immunocompromised state.  Neurological:  Positive for light-headedness. Negative for seizures, syncope, weakness, numbness and headaches.  Psychiatric/Behavioral:  Negative for confusion.    Physical Exam Updated Vital Signs BP (!) 163/68   Pulse 84   Temp 98.9 F (37.2 C) (Oral)   Resp 15   SpO2 100%   Physical Exam Vitals and nursing note reviewed.  Constitutional:      General: She is not in acute distress.    Appearance: She is obese. She is not ill-appearing or diaphoretic.  HENT:     Head: Normocephalic.  Eyes:     Conjunctiva/sclera: Conjunctivae normal.  Cardiovascular:     Rate and Rhythm: Normal rate and regular  rhythm.     Pulses:          Radial pulses are 3+ on the right side and 1+ on the left side.       Dorsalis pedis pulses are 2+ on the right side and 2+ on the left side.     Heart sounds: No murmur heard.   No friction rub. No gallop.  Pulmonary:     Effort: Pulmonary effort is normal. No  respiratory distress.     Breath sounds: No stridor. No wheezing, rhonchi or rales.  Chest:     Chest wall: No tenderness.  Abdominal:     General: There is no distension.     Palpations: Abdomen is soft. There is no mass.     Tenderness: There is no abdominal tenderness. There is no right CVA tenderness, left CVA tenderness, guarding or rebound.     Hernia: No hernia is present.  Musculoskeletal:     Cervical back: Neck supple.     Right lower leg: Edema present.     Left lower leg: Edema present.  Skin:    General: Skin is warm.     Findings: No rash.  Neurological:     General: No focal deficit present.     Mental Status: She is alert.  Psychiatric:        Behavior: Behavior normal.    ED Results / Procedures / Treatments   Labs (all labs ordered are listed, but only abnormal results are displayed) Labs Reviewed  COMPREHENSIVE METABOLIC PANEL - Abnormal; Notable for the following components:      Result Value   Glucose, Bld 268 (*)    Creatinine, Ser 1.17 (*)    GFR, Estimated 47 (*)    All other components within normal limits  TROPONIN I (HIGH SENSITIVITY) - Abnormal; Notable for the following components:   Troponin I (High Sensitivity) 22 (*)    All other components within normal limits  RESP PANEL BY RT-PCR (FLU A&B, COVID) ARPGX2  CBC WITH DIFFERENTIAL/PLATELET  BRAIN NATRIURETIC PEPTIDE  TROPONIN I (HIGH SENSITIVITY)    EKG EKG Interpretation  Date/Time:  Thursday December 15 2020 02:14:53 EDT Ventricular Rate:  99 PR Interval:  228 QRS Duration: 104 QT Interval:  378 QTC Calculation: 485 R Axis:   -41 Text Interpretation: Sinus rhythm with 1st degree A-V block Left  axis deviation Minimal voltage criteria for LVH, may be normal variant ( Cornell product ) Anterior infarct , age undetermined T wave abnormality, consider lateral ischemia Abnormal ECG When compared with ECG of 08/13/2020, No significant change was found Confirmed by Delora Fuel ([id number]) on 12/15/2020 6:36:03 AM  Radiology DG Chest 2 View  Result Date: 12/15/2020 CLINICAL DATA:  Shortness of breath EXAM: CHEST - 2 VIEW COMPARISON:  08/13/2020 FINDINGS: Cardiomegaly. Lungs clear. No effusions or edema. Aortic atherosclerosis. No acute bony abnormality. IMPRESSION: Cardiomegaly.  No active disease. Electronically Signed   By: Rolm Baptise M.D.   On: 12/15/2020 03:42   CT Angio Chest/Abd/Pel for Dissection W and/or Wo Contrast  Result Date: 12/15/2020 CLINICAL DATA:  80 year old female with chest and back pain. Asymmetric upper extremity blood pressure. Palpitations and dizziness. EXAM: CT ANGIOGRAPHY CHEST, ABDOMEN AND PELVIS TECHNIQUE: Non-contrast CT of the chest was initially obtained. Multidetector CT imaging through the chest, abdomen and pelvis was performed using the standard protocol during bolus administration of intravenous contrast. Multiplanar reconstructed images and MIPs were obtained and reviewed to evaluate the vascular anatomy. CONTRAST:  158m OMNIPAQUE IOHEXOL 350 MG/ML SOLN COMPARISON:  CT Abdomen and Pelvis 10/24/2019 and earlier. Chest radiographs 0329 hours today. FINDINGS: CTA CHEST FINDINGS Cardiovascular: Calcified coronary artery and aortic atherosclerosis. 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 Mild cardiac pulsation. Negative for thoracic aortic aneurysm or dissection. Calcified atherosclerosis involving the other proximal great vessels which appear patent. The main pulmonary artery appears patent.  Borderline to mild cardiomegaly. No pericardial effusion. Mediastinum/Nodes:  Negative. No mediastinal lymphadenopathy. Thyroid is negative for age. Lungs/Pleura: Atelectatic changes to the trachea and other major airways which remain patent. There is mild mosaic attenuation in both lungs compatible with a degree of gas trapping and atelectasis. Mild focal atelectasis at the right lung base. No pleural effusion or other abnormal pulmonary opacity. Musculoskeletal: No acute osseous abnormality identified. Ordinary degenerative changes in the spine. Review of the MIP images confirms the above findings. CTA ABDOMEN AND PELVIS FINDINGS VASCULAR Extensive Aortoiliac calcified atherosclerosis. Negative for abdominal aortic aneurysm or dissection. The major aortic branches remain patent despite extensive atherosclerosis. Bilateral iliac and visible proximal femoral arteries are patent despite plaque. Review of the MIP images confirms the above findings. NON-VASCULAR Hepatobiliary: Chronically absent gallbladder.  Negative liver. Pancreas: Negative. Spleen: Negative. Adrenals/Urinary Tract: Normal adrenal glands. Nonobstructed kidneys enhance symmetrically and appear normal. Ureters are decompressed. Mildly distended but otherwise unremarkable bladder. Stomach/Bowel: Low-density retained stool now in the large bowel. Scattered diverticulosis of the large bowel. No active inflammation identified. Appendix not delineated. No pericecal inflammation. No dilated small bowel. Terminal ileum appears within normal limits. Chronic dystrophic calcifications at the gastroesophageal junction are stable. Stomach is otherwise negative. There is a 2.5 cm duodenal diverticula in the midline with no active inflammation. No free air, free fluid or mesenteric inflammation. Lymphatic: No lymphadenopathy. Reproductive: Stable, negative. Other: No pelvic free fluid. Musculoskeletal: Chronic mild lower lumbar spondylolisthesis with advanced disc and facet degeneration. Chronic vacuum disc and vacuum facet. Chronic  multifactorial lumbar spinal stenosis is associated at L4-L5. No acute osseous abnormality identified. Review of the MIP images confirms the above findings. IMPRESSION: 1. Negative for aortic dissection or aneurysm. 2. 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 Coronary artery and Aortic atherosclerosis (ICD10-I70.0). 3. No superimposed acute or inflammatory process identified in the chest, abdomen, or pelvis. Diverticulosis of the large bowel and duodenum without active inflammation. 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 Electronically Signed   By: Genevie Ann M.D.   On: 12/15/2020 04:10    Procedures .Critical Care  Date/Time: 12/15/2020 6:55 AM Performed by: Joanne Gavel, PA-C Authorized by: Joanne Gavel, PA-C   Critical care provider statement:    Critical care time (minutes):  45   Critical care time was exclusive of:  Separately billable procedures and treating other patients and teaching time   Critical care was necessary to treat or prevent imminent or life-threatening deterioration of the following conditions:  Cardiac failure   Critical care was time spent personally by me on the following activities:  Ordering and performing treatments and interventions, ordering and review of laboratory studies, ordering and review of radiographic studies, pulse oximetry, re-evaluation of patient's condition, review of old charts, discussions with consultants, obtaining history from patient or surrogate, examination of patient, evaluation of patient's response to treatment and development of treatment plan with patient or surrogate   I assumed direction of critical care for this patient from another provider in my specialty: no     Care discussed with: admitting provider     Medications Ordered in ED Medications  LORazepam (ATIVAN)  injection 0.5 mg (0.5 mg Intravenous Given 12/15/20 0403)  iohexol (OMNIPAQUE) 350 MG/ML injection 100 mL (100 mLs Intravenous Contrast Given 12/15/20 0350)  sodium chloride 0.9 % bolus 500 mL (500 mLs Intravenous New Bag/Given  12/15/20 6387)    ED Course  I have reviewed the triage vital signs and the nursing notes.  Pertinent labs & imaging results that were available during my care of the patient were reviewed by me and considered in my medical decision making (see chart for details).    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  Patient reports that she had a panic attack prior to arrival.  Symptoms felt similar to previous panic attacks, which she has frequently, except she felt warm all over.  She called EMS and after they evaluated her at her house for about an hour, they brought her to the emergency department due to unequal blood pressures in the bilateral upper extremities.  They reported pressure in the 200s in the left upper extremity as compared to 120s on the right.  Suspect that this could be secondary to subclavian steal syndrome, but it sounds as if EMS has evaluated the patient many times at home and has not noticed asymmetric blood pressures.  Will order labs and CT dissection study.  The patient was seen and independently evaluated by Dr. Randal Buba, attending physician.  Hypertensive in the ED.  Vital signs are otherwise unremarkable.  Labs and imaging of been reviewed and independently interpreted by me.  Chest x-ray is negative.  No widening of the mediastinum.  Cr is 1.17.  CBC is unremarkable.  No significant change found  from previous EKG.  CT with extensive atherosclerosis.  There is an apparent origin of the right subclavian artery that is a normal variant.  This is severely stenotic due to bulky calcified plaque, but remains patent.  No evidence of dissection.  Initial troponin 15 --> repeat 22.  Patient is having no chest pain at this time.  Discussed with Dr. Ailene Ravel with cardiology.  Cardiology will follow the patient as a consult and recommends medical admission given uptrending troponins.  They will see the patient.  Please see their note for further recommendations.  Consult to the hospitalist team and Dr. Cyd Silence will accept the patient for admission. The patient appears reasonably stabilized for admission considering the current resources, flow, and capabilities available in the ED at this time, and I doubt any other [facility] requiring further screening and/or treatment in the ED prior to admission.   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

## 2020-12-16 ENCOUNTER — Ambulatory Visit: Payer: Medicare Other | Admitting: Internal Medicine

## 2020-12-16 DIAGNOSIS — F419 Anxiety disorder, unspecified: Secondary | ICD-10-CM | POA: Diagnosis not present

## 2020-12-16 LAB — BASIC METABOLIC PANEL
Anion gap: 9 (ref 5–15)
BUN: 10 mg/dL (ref 8–23)
CO2: 27 mmol/L (ref 22–32)
Calcium: 9.3 mg/dL (ref 8.9–10.3)
Chloride: 99 mmol/L (ref 98–111)
Creatinine, Ser: 1.14 mg/dL — ABNORMAL HIGH (ref 0.44–1.00)
GFR, Estimated: 48 mL/min — ABNORMAL LOW (ref >60)
Glucose, Bld: 277 mg/dL — ABNORMAL HIGH (ref 70–99)
Potassium: 4.3 mmol/L (ref 3.5–5.1)
Sodium: 135 mmol/L (ref 135–145)

## 2020-12-16 LAB — CBC
HCT: 39.2 % (ref 36.0–46.0)
Hemoglobin: 13.1 g/dL (ref 12.0–15.0)
MCH: 29.6 pg (ref 26.0–34.0)
MCHC: 33.4 g/dL (ref 30.0–36.0)
MCV: 88.7 fL (ref 80.0–100.0)
Platelets: 178 10*3/uL (ref 150–400)
RBC: 4.42 MIL/uL (ref 3.87–5.11)
RDW: 14.2 % (ref 11.5–15.5)
WBC: 7.4 10*3/uL (ref 4.0–10.5)
nRBC: 0.3 % — ABNORMAL HIGH (ref 0.0–0.2)

## 2020-12-16 LAB — GLUCOSE, CAPILLARY
Glucose-Capillary: 268 mg/dL — ABNORMAL HIGH (ref 70–99)
Glucose-Capillary: 303 mg/dL — ABNORMAL HIGH (ref 70–99)

## 2020-12-16 LAB — HEMOGLOBIN A1C
Hgb A1c MFr Bld: 9.8 % — ABNORMAL HIGH (ref 4.8–5.6)
Mean Plasma Glucose: 235 mg/dL

## 2020-12-16 MED ORDER — SPIRONOLACTONE 25 MG PO TABS: 25.0000 mg | ORAL_TABLET | Freq: Every day | ORAL | 3 refills | Status: DC

## 2020-12-16 MED ORDER — TORSEMIDE 20 MG PO TABS: 20.0000 mg | ORAL_TABLET | Freq: Every morning | ORAL | 0 refills | Status: DC

## 2020-12-16 MED ORDER — LORAZEPAM 0.5 MG PO TABS: 0.2500 mg | ORAL_TABLET | Freq: Two times a day (BID) | ORAL | 0 refills | Status: DC | PRN

## 2020-12-16 MED ORDER — CITALOPRAM HYDROBROMIDE 10 MG PO TABS: 10.0000 mg | ORAL_TABLET | Freq: Every day | ORAL | 3 refills | Status: DC

## 2020-12-16 NOTE — Progress Notes (Signed)
Echo shows normal biventricular function, no significant valvular disease.  Suspect mild troponin elevation was due to demand ischemia in setting of uncontrolled BP.   Denied any chest pain. BP improved, most recent 120/63.  No further cardiac work-up recommended as inpatient.  Does have significant coronary calcifications on CT, can consider outpatient stress test  [facility] will sign off.   Medication Recommendations:  No changes recommended Other recommendations (labs, testing, etc):  None Follow up as an outpatient:  Appointment on 8/29 with Dr Rockey Situ

## 2020-12-16 NOTE — Discharge Summary (Signed)
Physician Discharge Summary  Alexis Farmer ZOX:[id number] DOB: [birth year] DOA: 12/15/2020  PCP: McLean-Scocuzza, Nino Glow, MD  Admit date: 12/15/2020 Discharge date: 12/16/2020  Admitted From: Home  Disposition:  Home   Recommendations for Outpatient Follow-up:  Follow up with PCP in 1-2 weeks Please obtain BMP/CBC in one week Needs to follow up with PCP for further adjustment of Anxiety medications.  Needs to follow up with Cardiology for stress test  Needs further adjustment of DM medications.   Home Health: None  Discharge Condition:Stable.  CODE STATUS:Full Code Diet recommendation: Carb Modified /  Brief/Interim Summary: 80 year old with past medical history significant for hypertension, HLD, diastolic heart failure, PVD, right subclavian artery occlusion, CAD, diabetes type 2, CKD stage III A and morbid obesity presents complaining of anxiety.  Patient reports that worsening tinnitus, lightheadedness and flushed.  She also report urinary frequency, urgency lower extremity edema, mild nausea   Patient was found to have difference in her blood pressure: 120 on the right side versus 200 in the left side.  She was found to have hyperglycemia CBG in the 300s   1-Anxiety/panic attack, acute on chronic: Patient was a started on Celexa and Ativan as needed for anxiety after discussion with her primary care physician. Follow-up with PCP for further titration of medication  2-Elevated troponin: Likely related to elevated blood pressure Cardiology  is recommending outpatient stress test Echo with normal cardiac function  3-Hypertensive urgency: Improved.  Patient was a started on a spironolactone, does not seem that she was taking spironolactone at home. Plan to continue with clonidine, amlodipine, torsemide.   4-Subclavian steal syndrome -Needs to follow-up with vascular  5-Diabetes type 2: L hemoglobin A1c 9.8 Continue with home regimen.  Patient worries about her low blood  sugar at night. 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   Hypertensive urgency   Palpitations   Elevated troponin    Discharge Instructions  Discharge Instructions     Diet - low sodium heart healthy   Complete by: As directed    Increase activity slowly   Complete by: As directed       Allergies as of 12/16/2020       Reactions   Jardiance [empagliflozin] Other (See Comments)   Reaction not recalled   Tape Other (See Comments)   Leaves the skin "raw" if left on for a period of time- tolerates paper tape   Penicillin V Rash   Penicillins Rash   Has patient had a PCN reaction causing immediate rash, facial/tongue/throat swelling, SOB or lightheadedness with hypotension: Yes Has patient had a PCN reaction causing severe rash involving mucus membranes or skin necrosis: No Has patient had a PCN reaction that required hospitalization No Has patient had a PCN reaction occurring within the last 10 years: Yes If all of the above answers are "NO", then may proceed with Cephalosporin use.        Medication List     TAKE these medications    acetaminophen 500 MG tablet Commonly known as: TYLENOL Take 500 mg by mouth every 6 (six) hours as needed for mild pain or headache.   amLODipine 10 MG tablet Commonly known  as: NORVASC Take 1 tablet (10 mg total) by mouth daily.   aspirin EC 81 MG tablet Take 81 mg by mouth 2 (two) times a week. Swallow whole.   B-D INS SYR ULTRAFINE .5CC/30G 30G X 1/2" 0.5 ML Misc Generic drug: Insulin Syringe-Needle U-100 1 Device by Does not apply route daily. Up to 5x per day with insulin  bid (NPH) and tid (Regular)   blood glucose meter kit and supplies Kit Accu chek, Dx code E11.65, check 3 times daily   Centrum Silver 50+Women Tabs Take 1 tablet by mouth daily with breakfast.   citalopram 10 MG tablet Commonly known as: CELEXA Take 1 tablet (10 mg total) by mouth at bedtime.   cloNIDine 0.2 MG tablet Commonly known as: CATAPRES Take 1 tablet (0.2 mg total) by mouth 3 (three) times daily.   glucose blood test strip Use as instructed tid  Accuchek Guide   insulin NPH Human 100 UNIT/ML injection Commonly known as: NovoLIN N 25 units each morning, and 15 units each evening. What changed:  how much to take how to take this when to take this additional instructions   LORazepam 0.5 MG tablet Commonly known as: ATIVAN Take 0.5 tablets (0.25 mg total) by mouth 2 (two) times daily as needed for anxiety.   meclizine 12.5 MG tablet Commonly known as: ANTIVERT Take 1 tablet (12.5 mg total) by mouth daily as needed for dizziness.   mupirocin ointment 2 % Commonly known as: Bactroban Apply 1 application topically 2 (two) times daily. Right index finger and left forearm What changed:  when to take this reasons to take this additional instructions   polyethylene glycol powder 17 GM/SCOOP powder Commonly known as: GLYCOLAX/MIRALAX Take 17 g by mouth daily as needed for moderate constipation or severe constipation. Can take up to 2x per day prn. Mix with 8 ounces of liquid What changed:  when to take this additional instructions   rosuvastatin 20 MG tablet Commonly known as: CRESTOR TAKE 1 TABLET BY MOUTH EVERY DAY What changed:  how much to take how to take this when to take this   spironolactone 25 MG tablet Commonly known as: ALDACTONE Take 1 tablet (25 mg total) by mouth daily.   torsemide 20 MG tablet Commonly known as: DEMADEX Take 1 tablet (20 mg total) by mouth in the morning.   VITAMIN C PO Take 1 tablet by mouth daily.       ASK your  doctor about these medications    insulin regular 100 units/mL injection Commonly known as: NovoLIN R Inject 0-0.08 mLs (0-8 Units total) into the skin 3 (three) times daily before meals.        Allergies  Allergen Reactions   Jardiance [Empagliflozin] Other (See Comments)    Reaction not recalled   Tape Other (See Comments)    Leaves the skin "raw" if left on for a period of time- tolerates paper tape   Penicillin V Rash   Penicillins Rash    Has patient had a PCN reaction causing immediate rash, facial/tongue/throat swelling, SOB or lightheadedness with hypotension: Yes Has patient had a PCN reaction causing severe rash involving mucus membranes or skin necrosis: No Has patient had a PCN reaction that required hospitalization No Has patient had a PCN reaction occurring within the last 10 years: Yes If all of the above answers are "NO", then may proceed with Cephalosporin use.     Consultations: Cardiology   Procedures/Studies: DG Chest 2 View  Result Date:  12/15/2020 CLINICAL DATA:  Shortness of breath EXAM: CHEST - 2 VIEW COMPARISON:  08/13/2020 FINDINGS: Cardiomegaly. Lungs clear. No effusions or edema. Aortic atherosclerosis. No acute bony abnormality. IMPRESSION: Cardiomegaly.  No active disease. Electronically Signed   By: Rolm Baptise M.D.   On: 12/15/2020 03:42   CT Angio Chest/Abd/Pel for Dissection W and/or Wo Contrast  Result Date: 12/15/2020 CLINICAL DATA:  80 year old female with chest and back pain. Asymmetric upper extremity blood pressure. Palpitations and dizziness. EXAM: CT ANGIOGRAPHY CHEST, ABDOMEN AND PELVIS TECHNIQUE: Non-contrast CT of the chest was initially obtained. Multidetector CT imaging through the chest, abdomen and pelvis was performed using the standard protocol during bolus administration of intravenous contrast. Multiplanar reconstructed images and MIPs were obtained and reviewed to evaluate the vascular anatomy. CONTRAST:  145m OMNIPAQUE  IOHEXOL 350 MG/ML SOLN COMPARISON:  CT Abdomen and Pelvis 10/24/2019 and earlier. Chest radiographs 0329 hours today. FINDINGS: CTA CHEST FINDINGS Cardiovascular: Calcified coronary artery and aortic atherosclerosis. 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 Mild cardiac pulsation. Negative for thoracic aortic aneurysm or dissection. Calcified atherosclerosis involving the other proximal great vessels which appear patent. The main pulmonary artery appears patent. Borderline to mild cardiomegaly. No pericardial effusion. Mediastinum/Nodes: Negative. No mediastinal lymphadenopathy. Thyroid is negative for age. Lungs/Pleura: Atelectatic changes to the trachea and other major airways which remain patent. There is mild mosaic attenuation in both lungs compatible with a degree of gas trapping and atelectasis. Mild focal atelectasis at the right lung base. No pleural effusion or other abnormal pulmonary opacity. Musculoskeletal: No acute osseous abnormality identified. Ordinary degenerative changes in the spine. Review of the MIP images confirms the above findings. CTA ABDOMEN AND PELVIS FINDINGS VASCULAR Extensive Aortoiliac calcified atherosclerosis. Negative for abdominal aortic aneurysm or dissection. The major aortic branches remain patent despite extensive atherosclerosis. Bilateral iliac and visible proximal femoral arteries are patent despite plaque. Review of the MIP images confirms the above findings. NON-VASCULAR Hepatobiliary: Chronically absent gallbladder.  Negative liver. Pancreas: Negative. Spleen: Negative. Adrenals/Urinary Tract: Normal adrenal glands. Nonobstructed kidneys enhance symmetrically and appear normal. Ureters are decompressed. Mildly distended but otherwise unremarkable bladder. Stomach/Bowel: Low-density retained stool now in the large bowel. Scattered diverticulosis of the large bowel.  No active inflammation identified. Appendix not delineated. No pericecal inflammation. No dilated small bowel. Terminal ileum appears within normal limits. Chronic dystrophic calcifications at the gastroesophageal junction are stable. Stomach is otherwise negative. There is a 2.5 cm duodenal diverticula in the midline with no active inflammation. No free air, free fluid or mesenteric inflammation. Lymphatic: No lymphadenopathy. Reproductive: Stable, negative. Other: No pelvic free fluid. Musculoskeletal: Chronic mild lower lumbar spondylolisthesis with advanced disc and facet degeneration. Chronic vacuum disc and vacuum facet. Chronic multifactorial lumbar spinal stenosis is associated at L4-L5. No acute osseous abnormality identified. Review of the MIP images confirms the above findings. IMPRESSION: 1. Negative for aortic dissection or aneurysm. 2. 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 Coronary artery and Aortic atherosclerosis (ICD10-I70.0). 3. No superimposed acute or inflammatory process identified in the chest, abdomen, or pelvis. Diverticulosis of the large bowel and duodenum without active inflammation. 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 Electronically Signed   By: HGenevie AnnM.D.   On: 12/15/2020 04:10   ECHOCARDIOGRAM LIMITED  Result Date: 12/15/2020  ECHOCARDIOGRAM LIMITED REPORT   Patient Name:   Alexis Farmer Date of Exam: 12/15/2020 Medical Rec #:  [id number]        Height:       63.0 in Accession #:    [id number]       Weight:       257.0 lb Date of Birth:  02/01/1941        BSA:          2.151 m Patient Age:    39 years         BP:           172/83 mmHg Patient Gender: F                HR:           83 bpm. Exam Location:  Inpatient Procedure: 2D Echo, Cardiac Doppler and Color Doppler Indications:    Elevated Troponin   History:        Patient has prior history of Echocardiogram examinations, most                 recent 06/25/2016. CHF, CAD, PVD, Signs/Symptoms:CKD; Risk                 Factors:Hypertension, Dyslipidemia, Diabetes and Morbid obesity.  Sonographer:    Dustin Flock Referring Phys: [id number] RONDELL A SMITH  Sonographer Comments: Patient is morbidly obese. Image acquisition challenging due to patient body habitus. IMPRESSIONS  1. Left ventricular ejection fraction, by estimation, is 60 to 65%. The left ventricle has normal function. The left ventricle has no regional wall motion abnormalities. There is moderate concentric left ventricular hypertrophy. Indeterminate diastolic filling due to E-A fusion.  2. Right ventricular systolic function is normal. The right ventricular size is normal. Tricuspid regurgitation signal is inadequate for assessing PA pressure.  3. The mitral valve is grossly normal. No evidence of mitral valve regurgitation. No evidence of mitral stenosis.  4. The aortic valve is tricuspid. Aortic valve regurgitation is not visualized. No aortic stenosis is present. FINDINGS  Left Ventricle: Left ventricular ejection fraction, by estimation, is 60 to 65%. The left ventricle has normal function. The left ventricle has no regional wall motion abnormalities. The left ventricular internal cavity size was normal in size. There is  moderate concentric left ventricular hypertrophy. Indeterminate diastolic filling due to E-A fusion. Right Ventricle: The right ventricular size is normal. No increase in right ventricular wall thickness. Right ventricular systolic function is normal. Tricuspid regurgitation signal is inadequate for assessing PA pressure. Left Atrium: Left atrial size was normal in size. Right Atrium: Right atrial size was normal in size. Pericardium: Trivial pericardial effusion is present. Presence of pericardial fat pad. Mitral Valve: The mitral valve is grossly normal. No evidence of mitral  valve stenosis. Tricuspid Valve: The tricuspid valve is grossly normal. Tricuspid valve regurgitation is not demonstrated. No evidence of tricuspid stenosis. Aortic Valve: The aortic valve is tricuspid. Aortic valve regurgitation is not visualized. No aortic stenosis is present. Pulmonic Valve: The pulmonic valve was grossly normal. Pulmonic valve regurgitation is not visualized. No evidence of pulmonic stenosis. Aorta: The aortic root and ascending aorta are structurally normal, with no evidence of dilitation. Venous: The inferior vena cava was not well visualized. IAS/Shunts: The atrial septum is grossly normal. LEFT VENTRICLE PLAX 2D LVIDd:         3.90 cm     Diastology LVIDs:         2.75 cm  LV e' medial:    4.03 cm/s LV PW:         1.30 cm     LV E/e' medial:  25.3 LV IVS:        1.45 cm     LV e' lateral:   12.20 cm/s LVOT diam:     2.50 cm     LV E/e' lateral: 8.4 LV SV:         86 LV SV Index:   40 LVOT Area:     4.91 cm  LV Volumes (MOD) LV vol d, MOD A4C: 74.2 ml LV vol s, MOD A4C: 19.5 ml LV SV MOD A4C:     74.2 ml RIGHT VENTRICLE RV Basal diam:  3.20 cm RV S prime:     6.09 cm/s TAPSE (M-mode): 1.9 cm LEFT ATRIUM             Index       RIGHT ATRIUM           Index LA diam:        4.00 cm 1.86 cm/m  RA Area:     12.70 cm LA Vol (A2C):   17.8 ml 8.27 ml/m  RA Volume:   25.60 ml  11.90 ml/m LA Vol (A4C):   39.9 ml 18.55 ml/m LA Biplane Vol: 28.6 ml 13.30 ml/m  AORTIC VALVE LVOT Vmax:   82.50 cm/s LVOT Vmean:  56.500 cm/s LVOT VTI:    0.175 m  AORTA Ao Root diam: 3.20 cm MITRAL VALVE MV Area (PHT): 12.44 cm    SHUNTS MV Decel Time: 61 msec      Systemic VTI:  0.18 m MV E velocity: 102.00 cm/s  Systemic Diam: 2.50 cm MV A velocity: 52.00 cm/s MV E/A ratio:  1.96 Eleonore Chiquito MD Electronically signed by Eleonore Chiquito MD Signature Date/Time: 12/15/2020/2:39:03 PM    Final      Subjective: She is feeling well. Denies chest pain.   Discharge Exam: Vitals:   12/16/20 0402 12/16/20 1132  BP:  (!) 141/55 120/63  Pulse: 67 71  Resp: 18 18  Temp: 98.7 F (37.1 C) 98.7 F (37.1 C)  SpO2: 94% 96%     General: Pt is alert, awake, not in acute distress Cardiovascular: RRR, S1/S2 +, no rubs, no gallops Respiratory: CTA bilaterally, no wheezing, no rhonchi Abdominal: Soft, NT, ND, bowel sounds + Extremities: no edema, no cyanosis    The results of significant diagnostics from this hospitalization (including imaging, microbiology, ancillary and laboratory) are listed below for reference.     Microbiology: Recent Results (from the past 240 hour(s))  Resp Panel by RT-PCR (Flu A&B, Covid) Nasopharyngeal Swab     Status: None   Collection Time: 12/15/20  6:17 AM   Specimen: Nasopharyngeal Swab; Nasopharyngeal(NP) swabs in vial transport medium  Result Value Ref Range Status   SARS Coronavirus 2 by RT PCR NEGATIVE NEGATIVE Final    Comment: (NOTE) SARS-CoV-2 target nucleic acids are NOT DETECTED.  The SARS-CoV-2 RNA is generally detectable in upper respiratory specimens during the acute phase of infection. The lowest concentration of SARS-CoV-2 viral copies this assay can detect is 138 copies/mL. A negative result does not preclude SARS-Cov-2 infection and should not be used as the sole basis for treatment or other patient management decisions. A negative result may occur with  improper specimen collection/handling, submission of specimen other than nasopharyngeal swab, presence of viral mutation(s) within the areas targeted by this assay, and inadequate number of  viral copies(<138 copies/mL). A negative result must be combined with clinical observations, patient history, and epidemiological information. The expected result is Negative.  Fact Sheet for Patients:  EntrepreneurPulse.com.au  Fact Sheet for Healthcare Providers:  IncredibleEmployment.be  This test is no t yet approved or cleared by the [place] FDA and  has been  authorized for detection and/or diagnosis of SARS-CoV-2 by FDA under an Emergency Use Authorization (EUA). This EUA will remain  in effect (meaning this test can be used) for the duration of the COVID-19 declaration under Section 564(b)(1) of the Act, 21 U.S.C.section 360bbb-3(b)(1), unless the authorization is terminated  or revoked sooner.       Influenza A by PCR NEGATIVE NEGATIVE Final   Influenza B by PCR NEGATIVE NEGATIVE Final    Comment: (NOTE) The Xpert Xpress SARS-CoV-2/FLU/RSV plus assay is intended as an aid in the diagnosis of influenza from Nasopharyngeal swab specimens and should not be used as a sole basis for treatment. Nasal washings and aspirates are unacceptable for Xpert Xpress SARS-CoV-2/FLU/RSV testing.  Fact Sheet for Patients: EntrepreneurPulse.com.au  Fact Sheet for Healthcare Providers: IncredibleEmployment.be  This test is not yet approved or cleared by the [place] FDA and has been authorized for detection and/or diagnosis of SARS-CoV-2 by FDA under an Emergency Use Authorization (EUA). This EUA will remain in effect (meaning this test can be used) for the duration of the COVID-19 declaration under Section 564(b)(1) of the Act, 21 U.S.C. section 360bbb-3(b)(1), unless the authorization is terminated or revoked.  Performed at [facility], [place] [address] [place], [place] [address]      Labs: BNP (last 3 results) Recent Labs    08/13/20 0728 12/15/20 0217  BNP 25.3 14.9   Basic Metabolic Panel: Recent Labs  Lab 12/15/20 0217 12/15/20 0832 12/16/20 0337  NA 135  --  135  K 4.0  --  4.3  CL 98  --  99  CO2 28  --  27  GLUCOSE 268*  --  277*  BUN 11  --  10  CREATININE 1.17*  --  1.14*  CALCIUM 9.7  --  9.3  MG  --  1.9  --    Liver Function Tests: Recent Labs  Lab 12/15/20 0217  AST 29  ALT 24  ALKPHOS 47  BILITOT 1.1  PROT 7.1  ALBUMIN 4.0   No results for input(s): LIPASE,  AMYLASE in the last 168 hours. No results for input(s): AMMONIA in the last 168 hours. CBC: Recent Labs  Lab 12/15/20 0217 12/16/20 0337  WBC 8.2 7.4  NEUTROABS 3.1  --   HGB 13.8 13.1  HCT 42.6 39.2  MCV 90.6 88.7  PLT 249 178   Cardiac Enzymes: No results for input(s): CKTOTAL, CKMB, CKMBINDEX, TROPONINI in the last 168 hours. BNP: Invalid input(s): POCBNP CBG: Recent Labs  Lab 12/15/20 1203 12/15/20 1559 12/15/20 2126 12/16/20 0617 12/16/20 1050  GLUCAP 253* 251* 288* 268* 303*   D-Dimer No results for input(s): DDIMER in the last 72 hours. Hgb A1c Recent Labs    12/15/20 1451  HGBA1C 9.8*   Lipid Profile Recent Labs    12/15/20 0832  CHOL 119  HDL 46  LDLCALC 40  TRIG 165*  CHOLHDL 2.6   Thyroid function studies Recent Labs    12/15/20 0832  TSH 3.367   Anemia work up No results for input(s): VITAMINB12, FOLATE, FERRITIN, TIBC, IRON, RETICCTPCT in the last 72 hours. Urinalysis    Component Value  Date/Time   COLORURINE YELLOW 03/23/2020 1301   APPEARANCEUR CLEAR 03/23/2020 1301   APPEARANCEUR Clear 10/22/2018 0844   LABSPEC 1.014 03/23/2020 1301   PHURINE 7.0 03/23/2020 1301   GLUCOSEU NEGATIVE 03/23/2020 1301   HGBUR 1+ (A) 03/23/2020 1301   BILIRUBINUR NEGATIVE 03/09/2020 1022   BILIRUBINUR Negative 10/22/2018 0844   KETONESUR NEGATIVE 03/23/2020 1301   PROTEINUR 1+ (A) 03/23/2020 1301   UROBILINOGEN 0.2 03/09/2020 1022   NITRITE NEGATIVE 03/23/2020 1301   LEUKOCYTESUR NEGATIVE 03/23/2020 1301   Sepsis Labs Invalid input(s): PROCALCITONIN,  WBC,  LACTICIDVEN Microbiology Recent Results (from the past 240 hour(s))  Resp Panel by RT-PCR (Flu A&B, Covid) Nasopharyngeal Swab     Status: None   Collection Time: 12/15/20  6:17 AM   Specimen: Nasopharyngeal Swab; Nasopharyngeal(NP) swabs in vial transport medium  Result Value Ref Range Status   SARS Coronavirus 2 by RT PCR NEGATIVE NEGATIVE Final    Comment: (NOTE) SARS-CoV-2 target  nucleic acids are NOT DETECTED.  The SARS-CoV-2 RNA is generally detectable in upper respiratory specimens during the acute phase of infection. The lowest concentration of SARS-CoV-2 viral copies this assay can detect is 138 copies/mL. A negative result does not preclude SARS-Cov-2 infection and should not be used as the sole basis for treatment or other patient management decisions. A negative result may occur with  improper specimen collection/handling, submission of specimen other than nasopharyngeal swab, presence of viral mutation(s) within the areas targeted by this assay, and inadequate number of viral copies(<138 copies/mL). A negative result must be combined with clinical observations, patient history, and epidemiological information. The expected result is Negative.  Fact Sheet for Patients:  EntrepreneurPulse.com.au  Fact Sheet for Healthcare Providers:  IncredibleEmployment.be  This test is no t yet approved or cleared by the [place] FDA and  has been authorized for detection and/or diagnosis of SARS-CoV-2 by FDA under an Emergency Use Authorization (EUA). This EUA will remain  in effect (meaning this test can be used) for the duration of the COVID-19 declaration under Section 564(b)(1) of the Act, 21 U.S.C.section 360bbb-3(b)(1), unless the authorization is terminated  or revoked sooner.       Influenza A by PCR NEGATIVE NEGATIVE Final   Influenza B by PCR NEGATIVE NEGATIVE Final    Comment: (NOTE) The Xpert Xpress SARS-CoV-2/FLU/RSV plus assay is intended as an aid in the diagnosis of influenza from Nasopharyngeal swab specimens and should not be used as a sole basis for treatment. Nasal washings and aspirates are unacceptable for Xpert Xpress SARS-CoV-2/FLU/RSV testing.  Fact Sheet for Patients: EntrepreneurPulse.com.au  Fact Sheet for Healthcare  Providers: IncredibleEmployment.be  This test is not yet approved or cleared by the [place] FDA and has been authorized for detection and/or diagnosis of SARS-CoV-2 by FDA under an Emergency Use Authorization (EUA). This EUA will remain in effect (meaning this test can be used) for the duration of the COVID-19 declaration under Section 564(b)(1) of the Act, 21 U.S.C. section 360bbb-3(b)(1), unless the authorization is terminated or revoked.  Performed at [facility], [place] [address] [place], [place] [address]      Time coordinating discharge: 40 minutes  SIGNED:   Elmarie Shiley, MD  [facility]

## 2020-12-20 ENCOUNTER — Ambulatory Visit: Payer: Medicare Other | Admitting: Endocrinology

## 2020-12-20 ENCOUNTER — Telehealth: Payer: Self-pay

## 2020-12-20 NOTE — Telephone Encounter (Signed)
Transition Care Management Follow-up Telephone Call Date of discharge and from where: 12/16/20 from [facility]. How have you been since you were released from the hospital? Tired but okay. Denies anxiety attack since returning home, chest pain, lightheadedness, n/v/d. Taking medication as prescribed. Notes feels better staying with family than alone. Currently staying with  granddaughter/family. BS at 158 after meal this evening. Reports she feels like she is at her baseline but tired.  Any questions or concerns? No  Items Reviewed: Did the pt receive and understand the discharge instructions provided? Yes  Medications obtained and verified? Yes  Other? No  Any new allergies since your discharge? No Dietary orders reviewed? Yes Do you have support at home? Yes   Home Care and Equipment/Supplies: Were home health services ordered? no  Functional Questionnaire: (I = Independent and D = Dependent) ADLs: I  Follow up appointments reviewed:  PCP Hospital f/u appt confirmed? Yes  Scheduled to see PCP on 12/29/20. Are transportation arrangements needed? No  If their condition worsens, is the pt aware to call PCP or go to the Emergency Dept.? Yes Was the patient provided with contact information for the PCP's office or ED? Yes Was to pt encouraged to call back with questions or concerns? Yes

## 2020-12-22 ENCOUNTER — Telehealth: Payer: Self-pay | Admitting: Podiatry

## 2020-12-22 ENCOUNTER — Telehealth (INDEPENDENT_AMBULATORY_CARE_PROVIDER_SITE_OTHER): Payer: Medicare Other | Admitting: Podiatry

## 2020-12-22 NOTE — Telephone Encounter (Signed)
Patient calling concerned about skin peeling and flaking off from her left foot. Skin is hardened and sometimes causes soreness. She is requesting a call back. Please advise.

## 2020-12-22 NOTE — Telephone Encounter (Signed)
Thanks Lovena Le. I spoke to her and have addressed it. Have a good afternoon.

## 2020-12-22 NOTE — Telephone Encounter (Signed)
Please schedule for a sooner appointment

## 2020-12-22 NOTE — Telephone Encounter (Signed)
Phoned patient regarding skin peeling on her feet. States that skin is peeling off after her bath/shower despite her applying moisturizers. She is anxious because she is diabetic. Offered patient an appointment for tomorrow and she accepted. Will have schedulers call her today so that she may arrange transportation service for tomorrow's appointment. I will see her on tomorrow.

## 2020-12-23 ENCOUNTER — Ambulatory Visit: Payer: Medicare Other | Admitting: Podiatry

## 2020-12-26 ENCOUNTER — Other Ambulatory Visit: Payer: Self-pay

## 2020-12-26 ENCOUNTER — Encounter: Payer: Self-pay | Admitting: Podiatry

## 2020-12-26 ENCOUNTER — Ambulatory Visit (INDEPENDENT_AMBULATORY_CARE_PROVIDER_SITE_OTHER): Payer: Medicare Other | Admitting: Podiatry

## 2020-12-26 ENCOUNTER — Telehealth: Payer: Self-pay | Admitting: Cardiovascular Disease

## 2020-12-26 DIAGNOSIS — R234 Changes in skin texture: Secondary | ICD-10-CM | POA: Diagnosis not present

## 2020-12-26 MED ORDER — AMMONIUM LACTATE 12 % EX LOTN: 1.0000 "application " | TOPICAL_LOTION | Freq: Two times a day (BID) | CUTANEOUS | 4 refills | Status: DC

## 2020-12-26 NOTE — Telephone Encounter (Signed)
    Pt is requesting to switch from Dr. Audie Box to Dr. Rockey Situ, pt said her transforation can only brings her at the [place] office

## 2020-12-28 ENCOUNTER — Ambulatory Visit (INDEPENDENT_AMBULATORY_CARE_PROVIDER_SITE_OTHER): Payer: Medicare Other | Admitting: *Deleted

## 2020-12-28 DIAGNOSIS — I5032 Chronic diastolic (congestive) heart failure: Secondary | ICD-10-CM | POA: Diagnosis not present

## 2020-12-28 DIAGNOSIS — E1129 Type 2 diabetes mellitus with other diabetic kidney complication: Secondary | ICD-10-CM | POA: Diagnosis not present

## 2020-12-28 DIAGNOSIS — F419 Anxiety disorder, unspecified: Secondary | ICD-10-CM

## 2020-12-28 DIAGNOSIS — F339 Major depressive disorder, recurrent, unspecified: Secondary | ICD-10-CM

## 2020-12-28 NOTE — Patient Instructions (Signed)
Visit Information  PATIENT GOALS:  Goals Addressed             This Visit's Progress    (RNCM) Monitor and Manage My Blood Sugar   On track    Timeframe:  Long-Range Goal Priority:  Medium Start Date:    09/28/2020                         Expected End Date:  06/16/21                      Follow Up Date 01/23/21   Check blood sugar at least 4 times a day Check blood sugar if I feel it is too high or too low Enter blood sugar readings and medication/insulin into daily log Take the blood sugar log to all doctor visits  Discuss goal A1C with endocrinology  Take insulins as prescribed (25 Units in am and 15 Units in pm)   Why is this important?   Checking your blood sugar at home helps to keep it from getting very high or very low.  Writing the results in a diary or log helps the doctor know how to care for you.  Your blood sugar log should have the time, date and the results.  Also, write down the amount of insulin or other medicine that you take.  Other information, like what you ate, exercise done and how you were feeling, will also be helpful.     Notes:       (RNCM) Track and Manage Fluids and Swelling-Heart Failure   Not on track    Timeframe:  Long-Range Goal Priority:  Medium Start Date:   09/28/20                          Expected End Date:  06/16/21                     Follow Up Date 01/23/21    Please consider obtaining scale for home (large print or verbally read out numbers) Call office if I gain more than 2 pounds in one day or 5 pounds in one week Use salt in moderation Watch for swelling in feet, ankles and legs every day; elevate legs when sitting Weigh myself daily and write weights in log Take log to all provider appointments   Why is this important?   It is important to check your weight daily and watch how much salt and liquids you have.  It will help you to manage your heart failure.    Notes:          The patient verbalized understanding of  instructions, educational materials, and care plan provided today and declined offer to receive copy of patient instructions, educational materials, and care plan.   The care management team will reach out to the patient again over the next 30 business days.   [name] RN, MSN RN Care Management Coordinator [place] [phone] [email or web address]

## 2020-12-28 NOTE — Chronic Care Management (AMB) (Signed)
Chronic Care Management   CCM RN Visit Note  12/28/2020 Name: Alexis Farmer MRN: [id number] DOB: 02/16/1941  Subjective: Alexis Farmer is a 80 y.o. year old female who is a primary care patient of McLean-Scocuzza, Nino Glow, MD. The care management team was consulted for assistance with disease management and care coordination needs.    Engaged with patient by telephone for follow up visit in response to provider referral for case management and/or care coordination services.   Consent to Services:  The patient was given information about Chronic Care Management services, agreed to services, and gave verbal consent prior to initiation of services.  Please see initial visit note for detailed documentation.   Patient agreed to services and verbal consent obtained.   Assessment: Review of patient past medical history, allergies, medications, health status, including review of consultants reports, laboratory and other test data, was performed as part of comprehensive evaluation and provision of chronic care management services.   SDOH (Social Determinants of Health) assessments and interventions performed:    CCM Care Plan  Allergies  Allergen Reactions   Jardiance [Empagliflozin] Other (See Comments)    Reaction not recalled   Tape Other (See Comments)    Leaves the skin "raw" if left on for a period of time- tolerates paper tape   Penicillin V Rash   Penicillins Rash    Has patient had a PCN reaction causing immediate rash, facial/tongue/throat swelling, SOB or lightheadedness with hypotension: Yes Has patient had a PCN reaction causing severe rash involving mucus membranes or skin necrosis: No Has patient had a PCN reaction that required hospitalization No Has patient had a PCN reaction occurring within the last 10 years: Yes If all of the above answers are "NO", then may proceed with Cephalosporin use.     Outpatient Encounter Medications as of 12/28/2020  Medication Sig    citalopram (CELEXA) 10 MG tablet Take 1 tablet (10 mg total) by mouth at bedtime.   insulin NPH Human (NOVOLIN N) 100 UNIT/ML injection 25 units each morning, and 15 units each evening.   insulin regular (NOVOLIN R) 100 units/mL injection Inject 0-0.08 mLs (0-8 Units total) into the skin 3 (three) times daily before meals. (Patient taking differently: Inject 15 Units into the skin See admin instructions. Inject 15 units into the skin two to three times a day with food, PER SLIDING SCALE)   LORazepam (ATIVAN) 0.5 MG tablet Take 0.5 tablets (0.25 mg total) by mouth 2 (two) times daily as needed for anxiety.   acetaminophen (TYLENOL) 500 MG tablet Take 500 mg by mouth every 6 (six) hours as needed for mild pain or headache.   amLODipine (NORVASC) 10 MG tablet Take 1 tablet (10 mg total) by mouth daily.   ammonium lactate (AMLACTIN) 12 % lotion Apply 1 application topically 2 (two) times daily. Apply to feet once daily. Do not apply between toes.   Ascorbic Acid (VITAMIN C PO) Take 1 tablet by mouth daily.   aspirin EC 81 MG tablet Take 81 mg by mouth 2 (two) times a week. Swallow whole.   blood glucose meter kit and supplies KIT Accu chek, Dx code E11.65, check 3 times daily   cloNIDine (CATAPRES) 0.2 MG tablet Take 1 tablet (0.2 mg total) by mouth 3 (three) times daily.   glucose blood test strip Use as instructed tid  Accuchek Guide   Insulin Syringe-Needle U-100 (B-D INS SYR ULTRAFINE .5CC/30G) 30G X 1/2" 0.5 ML MISC 1 Device by Does not apply route  daily. Up to 5x per day with insulin bid (NPH) and tid (Regular)   meclizine (ANTIVERT) 12.5 MG tablet Take 1 tablet (12.5 mg total) by mouth daily as needed for dizziness.   Multiple Vitamins-Minerals (CENTRUM SILVER 50+WOMEN) TABS Take 1 tablet by mouth daily with breakfast.   mupirocin ointment (BACTROBAN) 2 % Apply 1 application topically 2 (two) times daily. Right index finger and left forearm   polyethylene glycol powder (GLYCOLAX/MIRALAX) 17  GM/SCOOP powder Take 17 g by mouth daily as needed for moderate constipation or severe constipation. Can take up to 2x per day prn. Mix with 8 ounces of liquid   rosuvastatin (CRESTOR) 20 MG tablet TAKE 1 TABLET BY MOUTH EVERY DAY   spironolactone (ALDACTONE) 25 MG tablet Take 1 tablet (25 mg total) by mouth daily.   torsemide (DEMADEX) 20 MG tablet Take 1 tablet (20 mg total) by mouth in the morning.   No facility-administered encounter medications on file as of 12/28/2020.    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 ([place]) 05/30/2020   Retinopathy 05/30/2020   Primary hypertension 03/25/2020   Multinodular goiter 02/04/2020   Peripheral edema 12/22/2019   Proteinuria 12/22/2019   Constipation 11/17/2019   Recurrent falls 08/13/2019   Physical deconditioning 08/13/2019   Abdominal aortic atherosclerosis ([place]) 07/24/2019   Morbid obesity with BMI of 45.0-49.9, adult ([place]) 06/09/2019   Hypercalcemia 03/18/2019   Round hole, unspecified eye 02/06/2019   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   Disorder of rotator cuff 06/24/2018   Abnormal gait 03/26/2018   Fall 03/26/2018   Arthritis 03/26/2018   Thyroid nodule 01/20/2018   Dysuria 03/22/2017   Cervical spondylosis with radiculopathy 01/26/2017   Rectal hemorrhage 07/29/2016   Carotid artery stenosis 07/19/2016   PAD (peripheral artery disease) ([place]) 07/19/2016   Swelling of lower leg 07/19/2016   Bradycardia 07/17/2016   Postmenopausal bleeding 07/17/2016   Chronic diastolic CHF (congestive heart failure) ([place]) 05/31/2016   Cough 01/25/2016   Chronic  fatigue 11/15/2015   Depression, recurrent ([place]) 10/13/2015   Osteoarthritis 10/13/2015   Vertigo 10/13/2015   Anxiety disorder 09/13/2015   Gastrointestinal hemorrhage 08/03/2015   Hyperglycemia due to type 2 diabetes mellitus ([place]) 03/11/2015   Mixed hyperlipidemia 08/26/2014   Essential hypertension 04/08/2014   Diabetes mellitus type II, uncontrolled ([place]) 04/08/2014   Chronic kidney disease, stage 2 (mild) 04/08/2014   Edema of foot 04/08/2014   Morbid obesity ([place]) 04/08/2014   Low back pain 04/08/2014   Diabetic polyneuropathy ([place]) 04/08/2014   Type 2 diabetes mellitus with other diabetic kidney complication ([place]) 83/15/1761    Conditions to be addressed/monitored:CHF and DMII  Care Plan : Diabetes Type 2 (Adult)  Updates made by Leona Singleton, RN since 12/28/2020 12:00 AM     Problem: Glycemic Management (Diabetes, Type 2)   Priority: High     Long-Range Goal: Patient will verbalize decrease in Hgb A1C by 0.3 points within the next 90 days   Start Date: 09/28/2020  Expected End Date: 03/17/2021  This Visit's Progress: On track  Recent Progress: On track  Priority: Medium  Note:   Objective:  Lab Results  Component Value Date  HGBA1C 9.8 (A) 10/17/2020  Current Barriers:  Knowledge Deficits related to basic Diabetes pathophysiology and self care/management as evidenced by continued elevated blood sugars and Hgb A1C increased to 9.8.  Recent hospitalization for anxiety and elevated troponins.  Discharged on 12/16/20.  States she has been spending more time at her granddaughters house with her family.  States this has helped a lot with her anxiety.  Does report not feeling well today and thinks it may be related to new medication (Celexa)-feeling nauseated and tired; but also admits appetite has been bad and she hasn't eaten much today. Acknowledges she does not eat correct at times to manage her diabetes.  Reports being compliant with all her medications and insulins.    Lack of self motivation to self management of diabetes Case Manager Clinical Goal(s):  patient will demonstrate improved adherence to prescribed treatment plan for diabetes self care/management as evidenced by: at least 4 times a day monitoring and recording of CBG,  adherence to ADA/ carb modified diet, adherence to prescribed medication regimen, contacting provider for new or worsened symptoms or questions Interventions:  Collaboration with McLean-Scocuzza, Nino Glow, MD regarding development and update of comprehensive plan of care as evidenced by provider attestation and co-signature Inter-disciplinary care team collaboration (see longitudinal plan of care) Provided education to patient about basic DM disease process Reviewed medications with patient and discussed importance of medication adherence Discussed plans with patient for ongoing care management follow up and provided patient with direct contact information for care management team Provided patient with verbal educational materials related to hypo and hyperglycemia and importance of correct treatment Reviewed scheduled/upcoming provider appointments including:  eye exam on 11/15/20 Advised patient, providing education and rationale, to check cbg at least 4 times a day and record, calling primary care provider or endocrinologist after consultation for findings outside established parameters.   Sent Diabetes Education and encouraged patient to review information Discussed diabetic carbohydrate modified diet and foods to limit or avoid; Provided medical nutrition therapy and development of individualized eating Discussed current Hgb A1C and what is normal and what is patient's goal; per patient her goal is below 10-discussed and encouraged patient to review goal with provider and to consider changing goal to lower Promoted self-monitoring of blood glucose levels.  Assessed and addressed barriers to management plan, such as food insecurity,  age, developmental ability, depression, anxiety, fear of hypoglycemia or weight gain, as well as medication cost, side effects and complicated regimen Discussed new endocrinologist and verified appointment is scheduled for August  Verified patient is aware of both appointments tomorrow on 12/29/20 with PCP and nephrologist and confirmed she has transportation (states she does) Discussed anxiety and new medications; encouraged to use diversional activities to help with anxiety; offered CCM Social Work referral for depression and anxiety resources (patient declines at this time) Emotional support and empathy provided to patient Encouraged to speak with PCP about medications Encouraged to spend more quality time with family Discussed Adult Day Care and becoming more involved/active (declines information at this time) Discussed diabetic shoes and foot care Reviewed current blood sugar readings and discussed hypoglycemia prevention and ways to help achieve better glycemic control Patient Goals/Self-Care Activities: Check blood sugar at least 4 times a day Check blood sugar if I feel it is too high or too low Enter blood sugar readings and medication/insulin into daily log Take the blood sugar log to all doctor visits  Discuss goal A1C with endocrinology  Take insulins as prescribed (25 Units in  am and 15 Units in pm) Follow Up Plan: The care management team will reach out to the patient again over the next 30 business days.       Care Plan : Heart Failure (Adult)  Updates made by Leona Singleton, RN since 12/28/2020 12:00 AM     Problem: Symptom Exacerbation (Heart Failure)   Priority: Medium     Long-Range Goal: Patient will report obtaining scale for home within the next 90 days   Start Date: 09/28/2020  Expected End Date: 06/16/2021  This Visit's Progress: Not on track  Recent Progress: Not on track  Priority: Medium  Note:   Current Barriers:  Knowledge deficit related to basic  heart failure pathophysiology and self care management as evidenced by not knowing heart failure diagnosis and treatment/self care management.  Continues to not weighing daily.  Continues to report not having scale at home but will consider obtaining one.  Does report compliance with fluid medications.  Reports lower extremity edema is about the same as always, resolves at night and swells with movement throughout the day.   Knowledge Deficits related to heart failure medications Case Manager Clinical Goal(s):  patient will obtain scale to weigh self daily and record patient will verbalize understanding of Heart Failure Action Plan and when to call doctor patient will take all Heart Failure mediations as prescribed patient will weigh daily and record (notifying MD of 3 lb weight gain over night or 5 lb in a week) Interventions:  Collaboration with McLean-Scocuzza, Nino Glow, MD regarding development and update of comprehensive plan of care as evidenced by provider attestation and co-signature Inter-disciplinary care team collaboration (see longitudinal plan of care) Basic overview and discussion of pathophysiology of Heart Failure reviewed  Provided verbal education on low sodium diet Assessed need for readable accurate scales in home and encouraged patient to obtain scale with large print or verbally read out weight to patient; stressed the importance of daily weight monitoring and need for scale in the home Provided education about placing scale on hard, flat surface Advised patient to weigh each morning after emptying bladder Discussed importance of daily weight and advised patient to weigh and record daily Reviewed role of diuretics in prevention of fluid overload and management of heart failure Barriers to lifestyle changes reviewed and addressed; barriers to treatment reviewed and addressed Healthy lifestyle promoted Self-awareness of signs/symptoms of worsening disease encouraged Assessed  understanding of adherence and barriers to treatment plan, as well as lifestyle changes; discussed strategies to address barriers Patient Goals/Self-Care Activities: Please consider obtaining scale for home (large print or verbally read out numbers) Call office if I gain more than 2 pounds in one day or 5 pounds in one week Use salt in moderation Watch for swelling in feet, ankles and legs every day; elevate legs when sitting Weigh myself daily and write weights in log Take log to all provider appointments Follow Up Plan: The care management team will reach out to the patient again over the next 30 business days.        Plan:The care management team will reach out to the patient again over the next 30 business days.  [name] RN, MSN RN Care Management Coordinator [place] [phone] [email or web address]

## 2020-12-29 ENCOUNTER — Encounter: Payer: Self-pay | Admitting: Internal Medicine

## 2020-12-29 ENCOUNTER — Ambulatory Visit (INDEPENDENT_AMBULATORY_CARE_PROVIDER_SITE_OTHER): Payer: Medicare Other | Admitting: Internal Medicine

## 2020-12-29 ENCOUNTER — Other Ambulatory Visit: Payer: Self-pay

## 2020-12-29 VITALS — BP 148/62 | HR 85 | Temp 98.6°F | Ht 63.0 in | Wt 264.0 lb

## 2020-12-29 DIAGNOSIS — F410 Panic disorder [episodic paroxysmal anxiety] without agoraphobia: Secondary | ICD-10-CM

## 2020-12-29 DIAGNOSIS — R5383 Other fatigue: Secondary | ICD-10-CM | POA: Diagnosis not present

## 2020-12-29 DIAGNOSIS — I10 Essential (primary) hypertension: Secondary | ICD-10-CM

## 2020-12-29 DIAGNOSIS — I700 Atherosclerosis of aorta: Secondary | ICD-10-CM

## 2020-12-29 DIAGNOSIS — M48061 Spinal stenosis, lumbar region without neurogenic claudication: Secondary | ICD-10-CM

## 2020-12-29 DIAGNOSIS — L720 Epidermal cyst: Secondary | ICD-10-CM

## 2020-12-29 DIAGNOSIS — N1831 Chronic kidney disease, stage 3a: Secondary | ICD-10-CM

## 2020-12-29 DIAGNOSIS — R0789 Other chest pain: Secondary | ICD-10-CM

## 2020-12-29 DIAGNOSIS — F339 Major depressive disorder, recurrent, unspecified: Secondary | ICD-10-CM

## 2020-12-29 DIAGNOSIS — M47816 Spondylosis without myelopathy or radiculopathy, lumbar region: Secondary | ICD-10-CM

## 2020-12-29 DIAGNOSIS — L0291 Cutaneous abscess, unspecified: Secondary | ICD-10-CM

## 2020-12-29 DIAGNOSIS — F419 Anxiety disorder, unspecified: Secondary | ICD-10-CM | POA: Diagnosis not present

## 2020-12-29 DIAGNOSIS — G458 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: Secondary | ICD-10-CM

## 2020-12-29 LAB — CBC WITH DIFFERENTIAL/PLATELET
Basophils Absolute: 0.1 10*3/uL (ref 0.0–0.1)
Basophils Relative: 1.1 % (ref 0.0–3.0)
Eosinophils Absolute: 0.2 10*3/uL (ref 0.0–0.7)
Eosinophils Relative: 2 % (ref 0.0–5.0)
HCT: 41.4 % (ref 36.0–46.0)
Hemoglobin: 13.8 g/dL (ref 12.0–15.0)
Lymphocytes Relative: 49.1 % — ABNORMAL HIGH (ref 12.0–46.0)
Lymphs Abs: 3.8 10*3/uL (ref 0.7–4.0)
MCHC: 33.4 g/dL (ref 30.0–36.0)
MCV: 88.9 fl (ref 78.0–100.0)
Monocytes Absolute: 0.8 10*3/uL (ref 0.1–1.0)
Monocytes Relative: 9.9 % (ref 3.0–12.0)
Neutro Abs: 2.9 10*3/uL (ref 1.4–7.7)
Neutrophils Relative %: 37.9 % — ABNORMAL LOW (ref 43.0–77.0)
Platelets: 240 10*3/uL (ref 150.0–400.0)
RBC: 4.66 Mil/uL (ref 3.87–5.11)
RDW: 14.1 % (ref 11.5–15.5)
WBC: 7.8 10*3/uL (ref 4.0–10.5)

## 2020-12-29 LAB — BASIC METABOLIC PANEL
BUN: 16 mg/dL (ref 6–23)
CO2: 32 mEq/L (ref 19–32)
Calcium: 10.2 mg/dL (ref 8.4–10.5)
Chloride: 96 mEq/L (ref 96–112)
Creatinine, Ser: 1.17 mg/dL (ref 0.40–1.20)
GFR: 43.62 mL/min — ABNORMAL LOW (ref >60.00)
Glucose, Bld: 146 mg/dL — ABNORMAL HIGH (ref 70–99)
Potassium: 4.2 mEq/L (ref 3.5–5.1)
Sodium: 137 mEq/L (ref 135–145)

## 2020-12-29 MED ORDER — LORAZEPAM 0.5 MG PO TABS: 0.2500 mg | ORAL_TABLET | Freq: Every day | ORAL | 5 refills | Status: DC | PRN

## 2020-12-29 MED ORDER — MUPIROCIN 2 % EX OINT: 1.0000 "application " | TOPICAL_OINTMENT | Freq: Two times a day (BID) | CUTANEOUS | 2 refills | Status: DC

## 2020-12-29 NOTE — Progress Notes (Signed)
Subjective: Alexis Farmer is a pleasant 80 y.o. female patient seen today with c/o peeling skin of left foot. Patient states when she gets out of the shower, her skin came off. She was able to peel it off, but she wants to make sure she has nothing else going on with her feet. She has been applying Vaseline Petroleum Jelly to her feet.   She also states she was not able to obtain her diabetic shoes. She continues to wear Crocs clogs as these are the only shoes to accommodate her lower extremity edema.  PCP is McLean-Scocuzza, Nino Glow, MD. Last visit was: 09/13/2020 via telemedicine.  Allergies  Allergen Reactions   Celexa [Citalopram]     Diarrhea upset stomach    Jardiance [Empagliflozin] Other (See Comments)    Reaction not recalled   Tape Other (See Comments)    Leaves the skin "raw" if left on for a period of time- tolerates paper tape   Penicillin V Rash   Penicillins Rash    Has patient had a PCN reaction causing immediate rash, facial/tongue/throat swelling, SOB or lightheadedness with hypotension: Yes Has patient had a PCN reaction causing severe rash involving mucus membranes or skin necrosis: No Has patient had a PCN reaction that required hospitalization No Has patient had a PCN reaction occurring within the last 10 years: Yes If all of the above answers are "NO", then may proceed with Cephalosporin use.    Objective: Physical Exam  General: Alexis Farmer is a pleasant 80 y.o. African American female, morbidly obese in NAD. AAO x 3.   Vascular:  Capillary refill time to digits immediate b/l. Nonpalpable pedal pulse(s) b/l lower extremities. Pedal hair absent. Lower extremity skin temperature gradient within normal limits. No pain with calf compression b/l. Nonpitting edema noted b/l lower extremities.  Dermatological:  No open wounds b/l lower extremities No interdigital macerations b/l lower extremities Toenails recently debrided. Small area of loose peeling skin  on medial aspect of left foot. No erythema, no drainage, no odor.  Musculoskeletal:  Normal muscle strength 5/5 to all lower extremity muscle groups bilaterally. No pain crepitus or joint limitation noted with ROM b/l. No gross bony deformities bilaterally.  Neurological:  Protective sensation decreased with 10 gram monofilament b/l.  Assessment and Plan:  1. Peeling skin    -Examined patient. -Discontinue Vaseline Petroleum Jelly. Start Ammonium Lactate Lotion 12%. Apply to feet twice daily. Call office with any questions/concerns. . -Patient to continue soft, supportive shoe gear daily. -Patient to report any pedal injuries to medical professional immediately. -Patient/POA to call should there be question/concern in the interim. -She is to keep her September appointment.  [name], DPM

## 2020-12-29 NOTE — Patient Instructions (Addendum)
Upcoming Encounters Upcoming Encounters Date Type Specialty Care Team Description  02/15/2021 Office Visit Endocrinology Honor Junes Justice Rocher, MD   [address], [place] [address]   [phone] (Work)   [phone] (Fax)

## 2021-01-01 NOTE — Telephone Encounter (Signed)
Encounter created in error. JLG

## 2021-01-12 ENCOUNTER — Telehealth: Payer: Self-pay | Admitting: Internal Medicine

## 2021-01-12 NOTE — Telephone Encounter (Signed)
She has refills at pharmacy call pharmacy

## 2021-01-12 NOTE — Telephone Encounter (Signed)
RX Refill:ativan  Last Seen:09-13-20 Last ordered:12-29-20

## 2021-01-12 NOTE — Telephone Encounter (Signed)
Daily as needed she has refills x 6 months at pharmacy can you all check and confirm and call pt and notify Thanks

## 2021-01-12 NOTE — Telephone Encounter (Signed)
Patient called in and would like to know if she is still supposed to take her LORazepam (ATIVAN) 0.5 MG tablet.Please advise.

## 2021-01-12 NOTE — Telephone Encounter (Signed)
Patient would like to know if Dr Olivia Mackie wants her to continue taking   LORazepam (ATIVAN) 0.5 MG tablet, if so she needs a refill.

## 2021-01-13 NOTE — Telephone Encounter (Signed)
Patient has been informed.

## 2021-01-13 NOTE — Telephone Encounter (Signed)
Left message for patient to return call back.  

## 2021-01-15 DIAGNOSIS — N1831 Chronic kidney disease, stage 3a: Secondary | ICD-10-CM

## 2021-01-15 DIAGNOSIS — R0789 Other chest pain: Secondary | ICD-10-CM | POA: Insufficient documentation

## 2021-01-15 DIAGNOSIS — F410 Panic disorder [episodic paroxysmal anxiety] without agoraphobia: Secondary | ICD-10-CM | POA: Insufficient documentation

## 2021-01-15 HISTORY — DX: Chronic kidney disease, stage 3a: N18.31

## 2021-01-15 HISTORY — DX: Other chest pain: R07.89

## 2021-01-15 NOTE — Progress Notes (Signed)
Chief Complaint  Patient presents with   Hospitalization Follow-up   HFU 1. Atypical chest pain +Anxiety/panic/depression tried celexa 10 mg and did not like the way it made her feel caused diarrhea upset stomach, nausea  Ativan 0.25-0.5 was tried in the hospital and helps and requesting refill  I DO NOT FILL 90 DAYS OF CONTROLLED rX 2.C/w subclavian steal per pt no sxs seen on imaging this admission declines vascular for now IMPRESSION: 1. Negative for aortic dissection or aneurysm.   2. 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 Coronary artery and Aortic atherosclerosis (ICD10-I70.0).   3. No superimposed acute or inflammatory process identified in the chest, abdomen, or pelvis. Diverticulosis of the large bowel and duodenum without active inflammation. 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     Electronically Signed   By: Genevie Ann M.D.   On: 12/15/2020 04:10  Of note cards appt 8/19 Dr. Rockey Situ and endocrine appt [facility] 02/15/21 to reestablish there    Review of Systems  Constitutional:  Negative for weight loss.  HENT:  Negative for hearing loss.   Eyes:  Negative for blurred vision.  Respiratory:  Negative for shortness of breath.   Cardiovascular:  Negative for chest pain.  Musculoskeletal:  Positive for falls.       Fall x 1   Skin:  Negative for rash.  Psychiatric/Behavioral:  The patient is not nervous/anxious.   Past Medical History:  Diagnosis Date   Arthritis    Chickenpox    Diabetes mellitus without complication ([place])    one elevated reading/ no treatment   Diverticulitis    GI bleed    High cholesterol    History of blood transfusion    Hypertension    Renal insufficiency    Past Surgical History:  Procedure Laterality Date   APPENDECTOMY     CHOLECYSTECTOMY     ECTOPIC PREGNANCY  SURGERY     EYE SURGERY     bilateral cataracts   EYE SURGERY     02/11/2019 repair hole in right eye    gallbladder      HYSTEROSCOPY WITH D & C N/A 10/26/2016   Procedure: DILATATION AND CURETTAGE /HYSTEROSCOPY;  Surgeon: Benjaman Kindler, MD;  Location: [facility] ORS;  Service: Gynecology;  Laterality: N/A;   HYSTEROSCOPY WITH D & C N/A 07/07/2018   Procedure: DILATATION AND CURETTAGE /HYSTEROSCOPY;  Surgeon: Benjaman Kindler, MD;  Location: [facility] ORS;  Service: Gynecology;  Laterality: N/A;   THYROIDECTOMY, PARTIAL     Family History  Problem Relation Age of Onset   Diabetes Mother    Hypertension Mother    Stroke Mother    Diabetes Other    Healthy Father    Diabetes Sister    Heart disease Sister    Social History   Socioeconomic History   Marital status: Widowed    Spouse name: Not on file   Number of children: 1   Years of education: 41   Highest education level: 12th grade  Occupational History   Occupation: retired    Comment: hx of Pharmacist, hospital, housekeeper, 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  Tobacco Use   Smoking status: Never   Smokeless tobacco: Never  Vaping Use   Vaping Use: Never used  Substance and Sexual Activity   Alcohol use: No   Drug use: No   Sexual  activity: Not Currently  Other Topics Concern   Not on file  Social History Narrative   Lives alone    From [place]   Widowed - was married 3 times starting at age 44    hx of seamtress, 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   Social Determinants of Health   Financial Resource Strain: Low Risk    Difficulty of Paying Living Expenses: Not hard at all  Food Insecurity: No Food Insecurity   Worried About Charity fundraiser in the Last Year: Never true   Arboriculturist in the Last Year: Never true  Transportation Needs: No Transportation Needs   Lack of Transportation (Medical): No   Lack of Transportation (Non-Medical): No   Physical Activity: Inactive   Days of Exercise per Week: 0 days   Minutes of Exercise per Session: 0 min  Stress: Stress Concern Present   Feeling of Stress : To some extent  Social Connections: Socially Isolated   Frequency of Communication with Friends and Family: More than three times a week   Frequency of Social Gatherings with Friends and Family: More than three times a week   Attends Religious Services: Never   Marine scientist or Organizations: No   Attends Archivist Meetings: Never   Marital Status: Widowed  Human resources officer Violence: Not At Risk   Fear of Current or Ex-Partner: No   Emotionally Abused: No   Physically Abused: No   Sexually Abused: No   Current Meds  Medication Sig   acetaminophen (TYLENOL) 500 MG tablet Take 500 mg by mouth every 6 (six) hours as needed for mild pain or headache.   amLODipine (NORVASC) 10 MG tablet Take 1 tablet (10 mg total) by mouth daily.   Ascorbic Acid (VITAMIN C PO) Take 1 tablet by mouth daily.   blood glucose meter kit and supplies KIT Accu chek, Dx code E11.65, check 3 times daily   cloNIDine (CATAPRES) 0.2 MG tablet Take 1 tablet (0.2 mg total) by mouth 3 (three) times daily.   glucose blood test strip Use as instructed tid  Accuchek Guide   insulin NPH Human (NOVOLIN N) 100 UNIT/ML injection 25 units each morning, and 15 units each evening.   insulin regular (NOVOLIN R) 100 units/mL injection Inject 0-0.08 mLs (0-8 Units total) into the skin 3 (three) times daily before meals. (Patient taking differently: Inject 15 Units into the skin See admin instructions. Inject 15 units into the skin two to three times a day with food, PER SLIDING SCALE)   Insulin Syringe-Needle U-100 (B-D INS SYR ULTRAFINE .5CC/30G) 30G X 1/2" 0.5 ML MISC 1 Device by Does not apply route daily. Up to 5x per day with insulin bid (NPH) and tid (Regular)   meclizine (ANTIVERT) 12.5 MG tablet Take 1 tablet (12.5 mg total) by mouth daily as needed  for dizziness.   Multiple Vitamins-Minerals (CENTRUM SILVER 50+WOMEN) TABS Take 1 tablet by mouth daily with breakfast.   rosuvastatin (CRESTOR) 20 MG tablet TAKE 1 TABLET BY MOUTH EVERY DAY   spironolactone (ALDACTONE) 25 MG tablet Take 1 tablet (25 mg total) by mouth daily.   torsemide (DEMADEX) 20 MG tablet Take 1 tablet (20 mg total) by mouth in the morning.   [DISCONTINUED] LORazepam (ATIVAN) 0.5 MG tablet Take 0.5 tablets (0.25 mg total) by mouth 2 (two) times daily as needed for anxiety.   [DISCONTINUED] mupirocin ointment (BACTROBAN) 2 % Apply 1 application topically 2 (  two) times daily. Right index finger and left forearm   Allergies  Allergen Reactions   Celexa [Citalopram]     Diarrhea upset stomach    Jardiance [Empagliflozin] Other (See Comments)    Reaction not recalled   Tape Other (See Comments)    Leaves the skin "raw" if left on for a period of time- tolerates paper tape   Penicillin V Rash   Penicillins Rash    Has patient had a PCN reaction causing immediate rash, facial/tongue/throat swelling, SOB or lightheadedness with hypotension: Yes Has patient had a PCN reaction causing severe rash involving mucus membranes or skin necrosis: No Has patient had a PCN reaction that required hospitalization No Has patient had a PCN reaction occurring within the last 10 years: Yes If all of the above answers are "NO", then may proceed with Cephalosporin use.    Recent Results (from the past 2160 hour(s))  CBC with Differential     Status: None   Collection Time: 12/15/20  2:17 AM  Result Value Ref Range   WBC 8.2 4.0 - 10.5 K/uL   RBC 4.70 3.87 - 5.11 MIL/uL   Hemoglobin 13.8 12.0 - 15.0 g/dL   HCT 42.6 36.0 - 46.0 %   MCV 90.6 80.0 - 100.0 fL   MCH 29.4 26.0 - 34.0 pg   MCHC 32.4 30.0 - 36.0 g/dL   RDW 13.8 11.5 - 15.5 %   Platelets 249 150 - 400 K/uL   nRBC 0.0 0.0 - 0.2 %   Neutrophils Relative % 37 %   Neutro Abs 3.1 1.7 - 7.7 K/uL   Lymphocytes Relative 48 %    Lymphs Abs 4.0 0.7 - 4.0 K/uL   Monocytes Relative 11 %   Monocytes Absolute 0.9 0.1 - 1.0 K/uL   Eosinophils Relative 3 %   Eosinophils Absolute 0.2 0.0 - 0.5 K/uL   Basophils Relative 1 %   Basophils Absolute 0.1 0.0 - 0.1 K/uL   Immature Granulocytes 0 %   Abs Immature Granulocytes 0.03 0.00 - 0.07 K/uL    Comment: Performed at [facility], [address] [place], [place] [address]  Comprehensive metabolic panel     Status: Abnormal   Collection Time: 12/15/20  2:17 AM  Result Value Ref Range   Sodium 135 135 - 145 mmol/L   Potassium 4.0 3.5 - 5.1 mmol/L   Chloride 98 98 - 111 mmol/L   CO2 28 22 - 32 mmol/L   Glucose, Bld 268 (H) 70 - 99 mg/dL    Comment: Glucose reference range applies only to samples taken after fasting for at least 8 hours.   BUN 11 8 - 23 mg/dL   Creatinine, Ser 1.17 (H) 0.44 - 1.00 mg/dL   Calcium 9.7 8.9 - 10.3 mg/dL   Total Protein 7.1 6.5 - 8.1 g/dL   Albumin 4.0 3.5 - 5.0 g/dL   AST 29 15 - 41 U/L   ALT 24 0 - 44 U/L   Alkaline Phosphatase 47 38 - 126 U/L   Total Bilirubin 1.1 0.3 - 1.2 mg/dL   GFR, Estimated 47 (L) >60 mL/min    Comment: (NOTE) Calculated using the CKD-EPI Creatinine Equation (2021)    Anion gap 9 5 - 15    Comment: Performed at [facility] [address] [place], [place] [address]  Troponin I (High Sensitivity)     Status: None   Collection Time: 12/15/20  2:17 AM  Result Value Ref Range  Troponin I (High Sensitivity) 15 <18 ng/L    Comment: (NOTE) Elevated high sensitivity troponin I (hsTnI) values and significant  changes across serial measurements may suggest ACS but many other  chronic and acute conditions are known to elevate hsTnI results.  Refer to the Links section for chest pain algorithms and additional  guidance. Performed at [facility], [place] [address], [place], [place] [address]   Brain natriuretic peptide     Status: None   Collection Time: 12/15/20  2:17 AM  Result Value Ref Range    B Natriuretic Peptide 34.0 0.0 - 100.0 pg/mL    Comment: Performed at [facility] [address] [place], [place] [address]  Troponin I (High Sensitivity)     Status: Abnormal   Collection Time: 12/15/20  4:10 AM  Result Value Ref Range   Troponin I (High Sensitivity) 22 (H) <18 ng/L    Comment: (NOTE) Elevated high sensitivity troponin I (hsTnI) values and significant  changes across serial measurements may suggest ACS but many other  chronic and acute conditions are known to elevate hsTnI results.  Refer to the "Links" section for chest pain algorithms and additional  guidance. Performed at [facility], [place] [address], [place], [place] [address]   Resp Panel by RT-PCR (Flu A&B, Covid) Nasopharyngeal Swab     Status: None   Collection Time: 12/15/20  6:17 AM   Specimen: Nasopharyngeal Swab; Nasopharyngeal(NP) swabs in vial transport medium  Result Value Ref Range   SARS Coronavirus 2 by RT PCR NEGATIVE NEGATIVE    Comment: (NOTE) SARS-CoV-2 target nucleic acids are NOT DETECTED.  The SARS-CoV-2 RNA is generally detectable in upper respiratory specimens during the acute phase of infection. The lowest concentration of SARS-CoV-2 viral copies this assay can detect is 138 copies/mL. A negative result does not preclude SARS-Cov-2 infection and should not be used as the sole basis for treatment or other patient management decisions. A negative result may occur with  improper specimen collection/handling, submission of specimen other than nasopharyngeal swab, presence of viral mutation(s) within the areas targeted by this assay, and inadequate number of viral copies(<138 copies/mL). A negative result must be combined with clinical observations, patient history, and epidemiological information. The expected result is Negative.  Fact Sheet for Patients:  EntrepreneurPulse.com.au  Fact Sheet for Healthcare Providers:   IncredibleEmployment.be  This test is no t yet approved or cleared by the [place] FDA and  has been authorized for detection and/or diagnosis of SARS-CoV-2 by FDA under an Emergency Use Authorization (EUA). This EUA will remain  in effect (meaning this test can be used) for the duration of the COVID-19 declaration under Section 564(b)(1) of the Act, 21 U.S.C.section 360bbb-3(b)(1), unless the authorization is terminated  or revoked sooner.       Influenza A by PCR NEGATIVE NEGATIVE   Influenza B by PCR NEGATIVE NEGATIVE    Comment: (NOTE) The Xpert Xpress SARS-CoV-2/FLU/RSV plus assay is intended as an aid in the diagnosis of influenza from Nasopharyngeal swab specimens and should not be used as a sole basis for treatment. Nasal washings and aspirates are unacceptable for Xpert Xpress SARS-CoV-2/FLU/RSV testing.  Fact Sheet for Patients: EntrepreneurPulse.com.au  Fact Sheet for Healthcare Providers: IncredibleEmployment.be  This test is not yet approved or cleared by the [place] FDA and has been authorized for detection and/or diagnosis of SARS-CoV-2 by FDA under an Emergency Use Authorization (EUA). This EUA will remain in effect (meaning this test  can be used) for the duration of the COVID-19 declaration under Section 564(b)(1) of the Act, 21 U.S.C. section 360bbb-3(b)(1), unless the authorization is terminated or revoked.  Performed at [facility], [place] [address], [place], [place] [address]   TSH     Status: None   Collection Time: 12/15/20  8:32 AM  Result Value Ref Range   TSH 3.367 0.350 - 4.500 uIU/mL    Comment: Performed by a 3rd Generation assay with a functional sensitivity of <=0.01 uIU/mL. Performed at [facility], [place] [address] [place], [place] [address]   Magnesium     Status: None   Collection Time: 12/15/20  8:32 AM  Result Value Ref Range   Magnesium 1.9 1.7 - 2.4 mg/dL     Comment: Performed at [facility] [address] [place], [place] [address]  Troponin I (High Sensitivity)     Status: Abnormal   Collection Time: 12/15/20  8:32 AM  Result Value Ref Range   Troponin I (High Sensitivity) 25 (H) <18 ng/L    Comment: (NOTE) Elevated high sensitivity troponin I (hsTnI) values and significant  changes across serial measurements may suggest ACS but many other  chronic and acute conditions are known to elevate hsTnI results.  Refer to the "Links" section for chest pain algorithms and additional  guidance. Performed at [facility], [place] [address], [place], [place] [address]   Lipid panel     Status: Abnormal   Collection Time: 12/15/20  8:32 AM  Result Value Ref Range   Cholesterol 119 0 - 200 mg/dL   Triglycerides 165 (H) <150 mg/dL   HDL 46 >40 mg/dL   Total CHOL/HDL Ratio 2.6 RATIO   VLDL 33 0 - 40 mg/dL   LDL Cholesterol 40 0 - 99 mg/dL    Comment:        Total Cholesterol/HDL:CHD Risk Coronary Heart Disease Risk Table                     Men   Women  1/2 Average Risk   3.4   3.3  Average Risk       5.0   4.4  2 X Average Risk   9.6   7.1  3 X Average Risk  23.4   11.0        Use the calculated Patient Ratio above and the CHD Risk Table to determine the patient's CHD Risk.        ATP III CLASSIFICATION (LDL):  <100     mg/dL   Optimal  100-129  mg/dL   Near or Above                    Optimal  130-159  mg/dL   Borderline  160-189  mg/dL   High  >190     mg/dL   Very High Performed at [facility] [address], [place], [place] [address]   CBG monitoring, ED     Status: Abnormal   Collection Time: 12/15/20  9:24 AM  Result Value Ref Range   Glucose-Capillary 189 (H) 70 - 99 mg/dL    Comment: Glucose reference range applies only to samples taken after fasting for at least 8 hours.  CBG monitoring, ED     Status: Abnormal   Collection Time: 12/15/20 12:03 PM  Result Value Ref Range   Glucose-Capillary 253  (H) 70 - 99 mg/dL  Comment: Glucose reference range applies only to samples taken after fasting for at least 8 hours.  ECHOCARDIOGRAM LIMITED     Status: None   Collection Time: 12/15/20 12:07 PM  Result Value Ref Range   BP 160/62 mmHg   Single Plane A4C EF 73.7 %   S' Lateral 2.75 cm   Area-P 1/2 12.44 cm2  Hemoglobin A1c     Status: Abnormal   Collection Time: 12/15/20  2:51 PM  Result Value Ref Range   Hgb A1c MFr Bld 9.8 (H) 4.8 - 5.6 %    Comment: (NOTE)         Prediabetes: 5.7 - 6.4         Diabetes: >6.4         Glycemic control for adults with diabetes: <7.0    Mean Plasma Glucose 235 mg/dL    Comment: (NOTE) Performed At: [facility] [address] [place], [place] [id number] Rush Farmer MD [id number]   Glucose, capillary     Status: Abnormal   Collection Time: 12/15/20  3:59 PM  Result Value Ref Range   Glucose-Capillary 251 (H) 70 - 99 mg/dL    Comment: Glucose reference range applies only to samples taken after fasting for at least 8 hours.  Glucose, capillary     Status: Abnormal   Collection Time: 12/15/20  9:26 PM  Result Value Ref Range   Glucose-Capillary 288 (H) 70 - 99 mg/dL    Comment: Glucose reference range applies only to samples taken after fasting for at least 8 hours.  CBC     Status: Abnormal   Collection Time: 12/16/20  3:37 AM  Result Value Ref Range   WBC 7.4 4.0 - 10.5 K/uL   RBC 4.42 3.87 - 5.11 MIL/uL   Hemoglobin 13.1 12.0 - 15.0 g/dL   HCT 39.2 36.0 - 46.0 %   MCV 88.7 80.0 - 100.0 fL   MCH 29.6 26.0 - 34.0 pg   MCHC 33.4 30.0 - 36.0 g/dL   RDW 14.2 11.5 - 15.5 %   Platelets 178 150 - 400 K/uL   nRBC 0.3 (H) 0.0 - 0.2 %    Comment: Performed at [facility], [place] [address], [place], [place] [address]  Basic metabolic panel     Status: Abnormal   Collection Time: 12/16/20  3:37 AM  Result Value Ref Range   Sodium 135 135 - 145 mmol/L   Potassium 4.3 3.5 - 5.1 mmol/L   Chloride 99 98 - 111 mmol/L   CO2 27  22 - 32 mmol/L   Glucose, Bld 277 (H) 70 - 99 mg/dL    Comment: Glucose reference range applies only to samples taken after fasting for at least 8 hours.   BUN 10 8 - 23 mg/dL   Creatinine, Ser 1.14 (H) 0.44 - 1.00 mg/dL   Calcium 9.3 8.9 - 10.3 mg/dL   GFR, Estimated 48 (L) >60 mL/min    Comment: (NOTE) Calculated using the CKD-EPI Creatinine Equation (2021)    Anion gap 9 5 - 15    Comment: Performed at [facility] [address], [place], [place] [address]  Glucose, capillary     Status: Abnormal   Collection Time: 12/16/20  6:17 AM  Result Value Ref Range   Glucose-Capillary 268 (H) 70 - 99 mg/dL    Comment: Glucose reference range applies only to samples taken after fasting for at least 8 hours.  Glucose, capillary  Status: Abnormal   Collection Time: 12/16/20 10:50 AM  Result Value Ref Range   Glucose-Capillary 303 (H) 70 - 99 mg/dL    Comment: Glucose reference range applies only to samples taken after fasting for at least 8 hours.  Basic Metabolic Panel (BMET)     Status: Abnormal   Collection Time: 12/29/20 10:56 AM  Result Value Ref Range   Sodium 137 135 - 145 mEq/L   Potassium 4.2 3.5 - 5.1 mEq/L   Chloride 96 96 - 112 mEq/L   CO2 32 19 - 32 mEq/L   Glucose, Bld 146 (H) 70 - 99 mg/dL   BUN 16 6 - 23 mg/dL   Creatinine, Ser 1.17 0.40 - 1.20 mg/dL   GFR 43.62 (L) >60.00 mL/min    Comment: Calculated using the CKD-EPI Creatinine Equation (2021)   Calcium 10.2 8.4 - 10.5 mg/dL  CBC with Differential/Platelet     Status: Abnormal   Collection Time: 12/29/20 10:56 AM  Result Value Ref Range   WBC 7.8 4.0 - 10.5 K/uL   RBC 4.66 3.87 - 5.11 Mil/uL   Hemoglobin 13.8 12.0 - 15.0 g/dL   HCT 41.4 36.0 - 46.0 %   MCV 88.9 78.0 - 100.0 fl   MCHC 33.4 30.0 - 36.0 g/dL   RDW 14.1 11.5 - 15.5 %   Platelets 240.0 150.0 - 400.0 K/uL   Neutrophils Relative % 37.9 (L) 43.0 - 77.0 %   Lymphocytes Relative 49.1 (H) 12.0 - 46.0 %   Monocytes Relative 9.9 3.0 - 12.0 %    Eosinophils Relative 2.0 0.0 - 5.0 %   Basophils Relative 1.1 0.0 - 3.0 %   Neutro Abs 2.9 1.4 - 7.7 K/uL   Lymphs Abs 3.8 0.7 - 4.0 K/uL   Monocytes Absolute 0.8 0.1 - 1.0 K/uL   Eosinophils Absolute 0.2 0.0 - 0.7 K/uL   Basophils Absolute 0.1 0.0 - 0.1 K/uL   Objective  Body mass index is 46.77 kg/m. Wt Readings from Last 3 Encounters:  12/29/20 264 lb (119.7 kg)  12/16/20 261 lb 3.2 oz (118.5 kg)  11/24/20 257 lb (116.6 kg)   Temp Readings from Last 3 Encounters:  12/29/20 98.6 F (37 C) (Oral)  12/16/20 98.7 F (37.1 C) (Oral)  11/18/20 98.6 F (37 C)   BP Readings from Last 3 Encounters:  12/29/20 (!) 148/62  12/16/20 120/63  11/18/20 (!) 165/68   Pulse Readings from Last 3 Encounters:  12/29/20 85  12/16/20 71  11/18/20 98    Physical Exam Vitals and nursing note reviewed.  Constitutional:      Appearance: Normal appearance. She is well-developed and well-groomed. She is morbidly obese.  HENT:     Head: Normocephalic and atraumatic.  Eyes:     Conjunctiva/sclera: Conjunctivae normal.     Pupils: Pupils are equal, round, and reactive to light.  Cardiovascular:     Rate and Rhythm: Normal rate and regular rhythm.     Heart sounds: Normal heart sounds. No murmur heard. Skin:    General: Skin is warm and dry.  Neurological:     General: No focal deficit present.     Mental Status: She is alert and oriented to person, place, and time. Mental status is at baseline.     Gait: Gait normal.  Psychiatric:        Attention and Perception: Attention and perception normal.        Mood and Affect: Mood and affect normal.  Speech: Speech normal.        Behavior: Behavior normal. Behavior is cooperative.        Thought Content: Thought content normal.        Cognition and Memory: Cognition and memory normal.        Judgment: Judgment normal.    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 Rockey Situ on norvasc 10 mg qd, clonidine 0.2 tid, spironolactone 25 mg qd,  torsemide 20 mg qd  Needed renal and cards help with BP control   HM Flu shot utd  3/3covid vxs had  Check on other vaccines (I.e Tdap, prevnar, pna 23, shingrix)  -as of 08/25/20 declines prevnar    mammo had 08/02/20 [facility] neg    Pap had 05/28/18 negative [facility] 07/07/18 endometrial bx neg and same 10/26/16  Of and off bleeding f/u ob/gyn and given progestin 08/11/19 did not pick up  appt 04/2020 gyn to f/u   DEXA pt declines for now  -no extra Ca h/o hyperCa    Colonoscopy  Dr. Allen Norris in [place]  -09/10/14 diverticulosis sessile inflammatory polyp=inflammatory and EGD chronic active gastritis   -f/u prn does not want to do further unless needed    CT chest 12/15/20  IMPRESSION: 1. Negative for aortic dissection or aneurysm.   2. 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 Coronary artery and Aortic atherosclerosis (ICD10-I70.0).   3. No superimposed acute or inflammatory process  identified in the chest, abdomen, or pelvis. Diverticulosis of the large bowel and duodenum without active inflammation. 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   CXR 01/20/18 pulm vascular congestion otherwise neg  cxr 06/22/19 with scarring    02/03/18 thyroid bx negative   Of note CT renal 07/16/16 c/w bulging disc and L4/5 spinal stenosis    [facility] saw 03/29/19 closed fx lateral malleolus right fibular broken ankle Carlynn Spry [facility]   Specialists Renal Dr. Wallace Keller Cards Dr. Randell Patient endocrine  Provider: Dr. Olivia Mackie McLean-Scocuzza-Internal Medicine

## 2021-01-16 DIAGNOSIS — M48061 Spinal stenosis, lumbar region without neurogenic claudication: Secondary | ICD-10-CM | POA: Insufficient documentation

## 2021-01-16 DIAGNOSIS — M47816 Spondylosis without myelopathy or radiculopathy, lumbar region: Secondary | ICD-10-CM | POA: Insufficient documentation

## 2021-01-16 DIAGNOSIS — I700 Atherosclerosis of aorta: Secondary | ICD-10-CM | POA: Insufficient documentation

## 2021-01-16 NOTE — Telephone Encounter (Signed)
Can we check this again call pharmacy I sent electronically on 1 month supply at a time?

## 2021-01-16 NOTE — Telephone Encounter (Signed)
Patient called in and stated her pharmacy does not have her LORazepam (ATIVAN) 0.5 MG tablet,please send to [facility] on [address] in [place].

## 2021-01-18 NOTE — Telephone Encounter (Signed)
Dr Olivia Mackie McLean-Scocuzza confirmed this with the pharmacy. They have the medication and will contact the Patient

## 2021-01-19 ENCOUNTER — Other Ambulatory Visit: Payer: Self-pay | Admitting: Internal Medicine

## 2021-01-19 DIAGNOSIS — I10 Essential (primary) hypertension: Secondary | ICD-10-CM

## 2021-01-23 ENCOUNTER — Ambulatory Visit: Payer: Medicare Other | Admitting: *Deleted

## 2021-01-23 DIAGNOSIS — E1129 Type 2 diabetes mellitus with other diabetic kidney complication: Secondary | ICD-10-CM

## 2021-01-23 DIAGNOSIS — I10 Essential (primary) hypertension: Secondary | ICD-10-CM

## 2021-01-23 DIAGNOSIS — I5032 Chronic diastolic (congestive) heart failure: Secondary | ICD-10-CM

## 2021-01-23 NOTE — Telephone Encounter (Signed)
Pt called to follow up on refill.

## 2021-01-23 NOTE — Chronic Care Management (AMB) (Signed)
  Care Management   Follow Up Note   01/23/2021 Name: Alexis Farmer MRN: [id number] DOB: 11/27/1940   Referred by: McLean-Scocuzza, Nino Glow, MD Reason for referral : Chronic Care Management (CHF, DM)   Multiple call attempts made to patient.  Each call patient was able to answer and then line went dead.   Was able to reach with text messaging to reschedule appointment with patient.  Follow Up Plan: The care management team will reach out to the patient again over the next 14 days per patient request.   [name] RN, MSN RN Care Management Coordinator Alexis Farmer [phone] [email or web address]

## 2021-01-30 ENCOUNTER — Ambulatory Visit (INDEPENDENT_AMBULATORY_CARE_PROVIDER_SITE_OTHER): Payer: Medicare Other | Admitting: *Deleted

## 2021-01-30 DIAGNOSIS — I5032 Chronic diastolic (congestive) heart failure: Secondary | ICD-10-CM | POA: Diagnosis not present

## 2021-01-30 DIAGNOSIS — I10 Essential (primary) hypertension: Secondary | ICD-10-CM

## 2021-01-30 DIAGNOSIS — E1129 Type 2 diabetes mellitus with other diabetic kidney complication: Secondary | ICD-10-CM

## 2021-01-30 NOTE — Patient Instructions (Signed)
Visit Information  PATIENT GOALS:  Goals Addressed             This Visit's Progress    (RNCM) Monitor and Manage My Blood Sugar   On track    Timeframe:  Long-Range Goal Priority:  Medium Start Date:    09/28/2020                         Expected End Date:  06/16/21                      Follow Up Date 03/01/21   Check blood sugar at least 4 times a day Check blood sugar if I feel it is too high or too low Enter blood sugar readings and medication/insulin into daily log Take the blood sugar log to all doctor visits  Discuss goal A1C with endocrinology  Take insulins as prescribed (25 Units in am and 15 Units in pm)   Why is this important?   Checking your blood sugar at home helps to keep it from getting very high or very low.  Writing the results in a diary or log helps the doctor know how to care for you.  Your blood sugar log should have the time, date and the results.  Also, write down the amount of insulin or other medicine that you take.  Other information, like what you ate, exercise done and how you were feeling, will also be helpful.     Notes:      (RNCM) Track and Manage Fluids and Swelling-Heart Failure   On track    Timeframe:  Long-Range Goal Priority:  Medium Start Date:   09/28/20                          Expected End Date:  06/16/21                     Follow Up Date 03/01/21    Call office if I gain more than 2 pounds in one day or 5 pounds in one week Use salt in moderation Watch for swelling in feet, ankles and legs every day; elevate legs when sitting Weigh myself daily and write weights in log Take log to all provider appointments   Why is this important?   It is important to check your weight daily and watch how much salt and liquids you have.  It will help you to manage your heart failure.    Notes:         The patient verbalized understanding of instructions, educational materials, and care plan provided today and declined offer to  receive copy of patient instructions, educational materials, and care plan.   The care management team will reach out to the patient again over the next 30 business days.   [name] RN, MSN RN Care Management Coordinator [place] [phone] [email or web address]

## 2021-01-30 NOTE — Chronic Care Management (AMB) (Signed)
Chronic Care Management   CCM RN Visit Note  01/30/2021 Name: Alexis Farmer MRN: [id number] DOB: 21-Jan-1941  Subjective: Alexis Farmer is a 80 y.o. year old female who is a primary care patient of McLean-Scocuzza, Nino Glow, MD. The care management team was consulted for assistance with disease management and care coordination needs.    Engaged with patient by telephone for follow up visit in response to provider referral for case management and/or care coordination services.   Consent to Services:  The patient was given information about Chronic Care Management services, agreed to services, and gave verbal consent prior to initiation of services.  Please see initial visit note for detailed documentation.   Patient agreed to services and verbal consent obtained.   Assessment: Review of patient past medical history, allergies, medications, health status, including review of consultants reports, laboratory and other test data, was performed as part of comprehensive evaluation and provision of chronic care management services.   SDOH (Social Determinants of Health) assessments and interventions performed:    CCM Care Plan  Allergies  Allergen Reactions   Celexa [Citalopram]     Diarrhea upset stomach    Jardiance [Empagliflozin] Other (See Comments)    Reaction not recalled   Tape Other (See Comments)    Leaves the skin "raw" if left on for a period of time- tolerates paper tape   Penicillin V Rash   Penicillins Rash    Has patient had a PCN reaction causing immediate rash, facial/tongue/throat swelling, SOB or lightheadedness with hypotension: Yes Has patient had a PCN reaction causing severe rash involving mucus membranes or skin necrosis: No Has patient had a PCN reaction that required hospitalization No Has patient had a PCN reaction occurring within the last 10 years: Yes If all of the above answers are "NO", then may proceed with Cephalosporin use.     Outpatient  Encounter Medications as of 01/30/2021  Medication Sig   acetaminophen (TYLENOL) 500 MG tablet Take 500 mg by mouth every 6 (six) hours as needed for mild pain or headache.   amLODipine (NORVASC) 10 MG tablet TAKE 1 TABLET BY MOUTH EVERY DAY   ammonium lactate (AMLACTIN) 12 % lotion Apply 1 application topically 2 (two) times daily. Apply to feet once daily. Do not apply between toes. (Patient not taking: Reported on 12/29/2020)   Ascorbic Acid (VITAMIN C PO) Take 1 tablet by mouth daily.   aspirin EC 81 MG tablet Take 81 mg by mouth 2 (two) times a week. Swallow whole. (Patient not taking: Reported on 12/29/2020)   blood glucose meter kit and supplies KIT Accu chek, Dx code E11.65, check 3 times daily   cloNIDine (CATAPRES) 0.2 MG tablet Take 1 tablet (0.2 mg total) by mouth 3 (three) times daily.   glucose blood test strip Use as instructed tid  Accuchek Guide   insulin NPH Human (NOVOLIN N) 100 UNIT/ML injection 25 units each morning, and 15 units each evening.   insulin regular (NOVOLIN R) 100 units/mL injection Inject 0-0.08 mLs (0-8 Units total) into the skin 3 (three) times daily before meals. (Patient taking differently: Inject 15 Units into the skin See admin instructions. Inject 15 units into the skin two to three times a day with food, PER SLIDING SCALE)   Insulin Syringe-Needle U-100 (B-D INS SYR ULTRAFINE .5CC/30G) 30G X 1/2" 0.5 ML MISC 1 Device by Does not apply route daily. Up to 5x per day with insulin bid (NPH) and tid (Regular)   LORazepam (ATIVAN) 0.5  MG tablet Take 0.5 tablets (0.25 mg total) by mouth daily as needed for anxiety.   meclizine (ANTIVERT) 12.5 MG tablet Take 1 tablet (12.5 mg total) by mouth daily as needed for dizziness.   Multiple Vitamins-Minerals (CENTRUM SILVER 50+WOMEN) TABS Take 1 tablet by mouth daily with breakfast.   mupirocin ointment (BACTROBAN) 2 % Apply 1 application topically 2 (two) times daily. Right index finger and left forearm   polyethylene glycol  powder (GLYCOLAX/MIRALAX) 17 GM/SCOOP powder Take 17 g by mouth daily as needed for moderate constipation or severe constipation. Can take up to 2x per day prn. Mix with 8 ounces of liquid (Patient not taking: Reported on 12/29/2020)   rosuvastatin (CRESTOR) 20 MG tablet TAKE 1 TABLET BY MOUTH EVERY DAY   spironolactone (ALDACTONE) 25 MG tablet Take 1 tablet (25 mg total) by mouth daily.   torsemide (DEMADEX) 20 MG tablet Take 1 tablet (20 mg total) by mouth in the morning.   No facility-administered encounter medications on file as of 01/30/2021.    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 ([place]) 05/30/2020   Retinopathy 05/30/2020   Primary hypertension 03/25/2020   Multinodular goiter 02/04/2020   Peripheral edema 12/22/2019   Proteinuria 12/22/2019   Constipation 11/17/2019   Recurrent falls 08/13/2019   Physical deconditioning 08/13/2019   Abdominal aortic atherosclerosis ([place]) 07/24/2019   Morbid obesity with BMI of 45.0-49.9, adult ([place]) 06/09/2019   Hypercalcemia 03/18/2019   Round hole, unspecified eye 02/06/2019   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   Disorder of rotator cuff 06/24/2018   Abnormal gait 03/26/2018   Fall 03/26/2018   Arthritis 03/26/2018   Thyroid nodule 01/20/2018   Dysuria 03/22/2017   Cervical spondylosis with radiculopathy 01/26/2017   Rectal  hemorrhage 07/29/2016   Carotid artery stenosis 07/19/2016   PAD (peripheral artery disease) ([place]) 07/19/2016   Swelling of lower leg 07/19/2016   Bradycardia 07/17/2016   Postmenopausal bleeding 07/17/2016   Chronic diastolic CHF (congestive heart failure) ([place]) 05/31/2016   Cough 01/25/2016   Chronic fatigue 11/15/2015   Depression, recurrent ([place]) 10/13/2015   Osteoarthritis 10/13/2015   Vertigo 10/13/2015   Anxiety disorder 09/13/2015   Gastrointestinal hemorrhage 08/03/2015   Hyperglycemia due to type 2 diabetes mellitus ([place]) 03/11/2015   Mixed hyperlipidemia 08/26/2014   Essential hypertension 04/08/2014   Diabetes mellitus type II, uncontrolled ([place]) 04/08/2014   Chronic kidney disease, stage 2 (mild) 04/08/2014   Edema of foot 04/08/2014   Morbid obesity ([place]) 04/08/2014   Low back pain 04/08/2014   Diabetic polyneuropathy ([place]) 04/08/2014   Type 2 diabetes mellitus with other diabetic kidney complication ([place]) 05/04/3566    Conditions to be addressed/monitored:CHF and DMII  Care Plan : Diabetes Type 2 (Adult)  Updates made by Leona Singleton, RN since 01/30/2021 12:00 AM     Problem: Glycemic Management (Diabetes, Type 2)   Priority: High     Long-Range Goal: Patient will  verbalize decrease in Hgb A1C by 0.3 points within the next 90 days   Start Date: 09/28/2020  Expected End Date: 08/15/2021  This Visit's Progress: On track  Recent Progress: On track  Priority: Medium  Note:   Objective:  Lab Results  Component Value Date   HGBA1C 9.8 (A) 10/17/2020  Current Barriers:  Knowledge Deficits related to basic Diabetes pathophysiology and self care/management as evidenced by continued elevated blood sugars and Hgb A1C increased to 9.8.  Recent hospitalization for anxiety and elevated troponins.  Discharged on 12/16/20.  Continues to spend more time at her granddaughters house with her family.  States this continues help a lot with her anxiety.  Fasting blood sugar  this morning was 122 (low per patient).  Acknowledges she does not eat correct at times to manage her diabetes.  Reports being compliant with all her medications and insulins.   Lack of self motivation to self management of diabetes Case Manager Clinical Goal(s):  patient will demonstrate improved adherence to prescribed treatment plan for diabetes self care/management as evidenced by: at least 4 times a day monitoring and recording of CBG,  adherence to ADA/ carb modified diet, adherence to prescribed medication regimen, contacting provider for new or worsened symptoms or questions Interventions:  Collaboration with McLean-Scocuzza, Nino Glow, MD regarding development and update of comprehensive plan of care as evidenced by provider attestation and co-signature Inter-disciplinary care team collaboration (see longitudinal plan of care) Provided education to patient about basic DM disease process Reviewed medications with patient and discussed importance of medication adherence Discussed plans with patient for ongoing care management follow up and provided patient with direct contact information for care management team Provided patient with verbal educational materials related to hypo and hyperglycemia and importance of correct treatment Reviewed scheduled/upcoming provider appointments including:  eye exam on 11/15/20 Advised patient, providing education and rationale, to check cbg at least 4 times a day and record, calling primary care provider or endocrinologist after consultation for findings outside established parameters.   Sent Diabetes Education and encouraged patient to review information Discussed diabetic carbohydrate modified diet and foods to limit or avoid; Provided medical nutrition therapy and development of individualized eating Discussed current Hgb A1C and what is normal and what is patient's goal; per patient her goal is below 10-discussed and encouraged patient to review goal with  provider and to consider changing goal to lower Promoted self-monitoring of blood glucose levels.  Assessed and addressed barriers to management plan, such as food insecurity, age, developmental ability, depression, anxiety, fear of hypoglycemia or weight gain, as well as medication cost, side effects and complicated regimen Discussed new endocrinologist and verified appointment is scheduled for August  Discussed anxiety and new medications; encouraged to use diversional activities to help with anxiety; offered CCM Social Work referral for depression and anxiety resources (patient declines at this time) Emotional support and empathy provided to patient Encouraged to speak with PCP about medications Encouraged to spend more quality time with family Discussed diabetic shoes and foot care Reviewed current blood sugar readings and discussed hypoglycemia prevention and ways to help achieve better glycemic control; discussed how to treat hypoglycemia to prevent rebound hyperglycemia Patient Goals/Self-Care Activities: Check blood sugar at least 4 times a day Check blood sugar if I feel it is too high or too low Enter blood sugar readings and medication/insulin into daily log Take the blood sugar log to all doctor visits  Discuss goal A1C with endocrinology  Take insulins as prescribed (25  Units in am and 15 Units in pm) Follow Up Plan: The care management team will reach out to the patient again over the next 30 business days.       Care Plan : Heart Failure (Adult)  Updates made by Leona Singleton, RN since 01/30/2021 12:00 AM     Problem: Symptom Exacerbation (Heart Failure)   Priority: Medium     Long-Range Goal: Patient will report obtaining scale for home within the next 90 days   Start Date: 09/28/2020  Expected End Date: 06/16/2021  This Visit's Progress: On track  Recent Progress: Not on track  Priority: Medium  Note:   Current Barriers:  Knowledge deficit related to basic  heart failure pathophysiology and self care management as evidenced by not knowing heart failure diagnosis and treatment/self care management.  Reports weighong self daily now.  Weight this morning 263 pounds ([id number]).  Does report compliance with fluid medications.  Reports lower extremity edema is about the same as always, resolves at night and swells with movement throughout the day.   Knowledge Deficits related to heart failure medications Case Manager Clinical Goal(s):  patient will obtain scale to weigh self daily and record patient will verbalize understanding of Heart Failure Action Plan and when to call doctor patient will take all Heart Failure mediations as prescribed patient will weigh daily and record (notifying MD of 3 lb weight gain over night or 5 lb in a week) Interventions:  Collaboration with McLean-Scocuzza, Nino Glow, MD regarding development and update of comprehensive plan of care as evidenced by provider attestation and co-signature Inter-disciplinary care team collaboration (see longitudinal plan of care) Basic overview and discussion of pathophysiology of Heart Failure reviewed  Provided verbal education on low sodium diet Assessed need for readable accurate scales in home and encouraged patient to obtain scale with large print or verbally read out weight to patient; stressed the importance of daily weight monitoring and need for scale in the home Provided education about placing scale on hard, flat surface Advised patient to weigh each morning after emptying bladder Discussed importance of daily weight and advised patient to weigh and record daily Reviewed role of diuretics in prevention of fluid overload and management of heart failure Barriers to lifestyle changes reviewed and addressed; barriers to treatment reviewed and addressed Healthy lifestyle promoted Self-awareness of signs/symptoms of worsening disease encouraged Assessed understanding of adherence and  barriers to treatment plan, as well as lifestyle changes; discussed strategies to address barriers Provided patient ear for venting outlet; emotional support and empathy provided to patient  Patient Goals/Self-Care Activities: Call office if I gain more than 2 pounds in one day or 5 pounds in one week Use salt in moderation Watch for swelling in feet, ankles and legs every day; elevate legs when sitting Weigh myself daily and write weights in log Take log to all provider appointments Follow Up Plan: The care management team will reach out to the patient again over the next 30 business days.        Plan:The care management team will reach out to the patient again over the next 30 business days.  [name] RN, MSN RN Care Management Coordinator [place] [phone] [email or web address]

## 2021-02-01 ENCOUNTER — Telehealth: Payer: Self-pay

## 2021-02-01 ENCOUNTER — Other Ambulatory Visit: Payer: Self-pay

## 2021-02-01 DIAGNOSIS — M7989 Other specified soft tissue disorders: Secondary | ICD-10-CM

## 2021-02-01 NOTE — Telephone Encounter (Signed)
Patient calls today to report that her kidney doctor told her she needed to see [place] to "pull water off her legs." From the nephrology note, it appears patient has persistent pedal edema and suspected venous insufficiency. She says her legs and feet are not cold, denies ulcers. The swelling is worse on the left and her legs are uncomfortable. She is very concerned about having surgery. Advised patient I was placing her on the schedule for a venous reflux study and a visit with the MD, and the test would not hurt. She asked again about "pulling the water out of her legs." Advised her that she had heart failure and that is probably contributing - but that we were going to gather information about her veins. She again voiced her concerns about surgery - advised that she would not be having surgery on her appt. Patient instructed to call back if further concerns prior to appt.

## 2021-02-10 NOTE — Progress Notes (Signed)
Cardiology Office Note  Date:  02/13/2021   ID:  Alexis Farmer, DOB 04-May-1941, MRN [id number]  PCP:  McLean-Scocuzza, Nino Glow, MD   Chief Complaint  Patient presents with   4 month follow up     Patient c/o shortness of breath with walking and LE edema. Medications reviewed by the patient's bottles.     HPI:  Alexis Farmer is a 80 y.o. woman with history of  obesity,  insulin-dependent diabetes, Hemoglobin A1c 9.5, poor diet, hypertension,  hyperlipidemia,  moderate LVH  hospital 08/13/2014  chest pain, malignant hypertension, possible sleep apnea,  chronic kidney disease,  Anxiety Notes indicate previous history of GI bleeding  colonoscopy which was normal by her report She presents to for follow-up of her hypertension, chronic diastolic CHF, poorly controlled diabetes, CKD  Reports having some leg swelling on today's visit Weight stable Renal function stable, numbers discussed  Recently seen in the hospital end of June 2022 Symptoms of anxiety.  Patient reports that worsening tinnitus, lightheadedness and flushed.  She also report urinary frequency, urgency lower extremity edema, mild nausea.  Troponin of 25 felt secondary to elevated blood pressure  Review of details indicates drop in blood pressure right arm, this was confirmed in our office today 25 point drop in systolic pressure right arm compared to left arm  hemoglobin A1c 9.8  echocardiogram reviewed from June 2022   1. Left ventricular ejection fraction, by estimation, is 60 to 65%. The  left ventricle has normal function. The left ventricle has no regional  wall motion abnormalities. There is moderate concentric left ventricular  hypertrophy. Indeterminate diastolic  filling due to E-A fusion.   2. Right ventricular systolic function is normal. The right ventricular  size is normal. Tricuspid regurgitation signal is inadequate for assessing  PA pressure.   CT chest reviewed Calcified coronary artery and  aortic atherosclerosis. 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 Negative for thoracic aortic aneurysm or dissection. Calcified atherosclerosis involving the other proximal great vessels which appear patent   EKG personally reviewed by myself on todays visit Shows NSR rate 72 bpm, LAD, ST and T wave ABN I and AVL  Other past medical hx reviewed 10/2016  D&C, hysteroscopy.  Postmenopausal bleeding.   Previous Echocardiogram 08/13/2014 showed normal ejection fraction, normal RV size and function, moderate LVH   Renal artery duplex showing no blockage of the mid to distal vessels bilaterally, challenging study    PMH:   has a past medical history of Arthritis, Chickenpox, Diabetes mellitus without complication ([place]), Diverticulitis, GI bleed, High cholesterol, History of blood transfusion, Hypertension, and Renal insufficiency.  PSH:    Past Surgical History:  Procedure Laterality Date   APPENDECTOMY     CHOLECYSTECTOMY     ECTOPIC PREGNANCY SURGERY     EYE SURGERY     bilateral cataracts   EYE SURGERY     02/11/2019 repair hole in right eye    gallbladder      HYSTEROSCOPY WITH D & C N/A 10/26/2016   Procedure: DILATATION AND CURETTAGE /HYSTEROSCOPY;  Surgeon: Benjaman Kindler, MD;  Location: [facility] ORS;  Service: Gynecology;  Laterality: N/A;   HYSTEROSCOPY WITH D & C N/A 07/07/2018   Procedure: DILATATION AND CURETTAGE /HYSTEROSCOPY;  Surgeon: Benjaman Kindler, MD;  Location: [facility] ORS;  Service: Gynecology;  Laterality: N/A;   THYROIDECTOMY, PARTIAL      Current Outpatient Medications  Medication Sig Dispense  Refill   acetaminophen (TYLENOL) 500 MG tablet Take 500 mg by mouth every 6 (six) hours as needed for mild pain or headache.     amLODipine (NORVASC) 10 MG tablet TAKE 1 TABLET BY MOUTH EVERY DAY 90 tablet 3   Ascorbic Acid (VITAMIN C PO) Take 1 tablet by mouth daily.      aspirin EC 81 MG tablet Take 81 mg by mouth 2 (two) times a week. Swallow whole.     blood glucose meter kit and supplies KIT Accu chek, Dx code E11.65, check 3 times daily 1 each 0   citalopram (CELEXA) 10 MG tablet Take 10 mg by mouth daily.     cloNIDine (CATAPRES) 0.2 MG tablet Take 1 tablet (0.2 mg total) by mouth 3 (three) times daily. 270 tablet 3   glucose blood test strip Use as instructed tid  Accuchek Guide 300 each 12   insulin NPH Human (NOVOLIN N) 100 UNIT/ML injection 25 units each morning, and 15 units each evening. 40 mL 11   insulin regular (NOVOLIN R) 100 units/mL injection Inject 0-0.08 mLs (0-8 Units total) into the skin 3 (three) times daily before meals. (Patient taking differently: Inject 15 Units into the skin See admin instructions. Inject 15 units into the skin two to three times a day with food, PER SLIDING SCALE) 30 mL 11   Insulin Syringe-Needle U-100 (B-D INS SYR ULTRAFINE .5CC/30G) 30G X 1/2" 0.5 ML MISC 1 Device by Does not apply route daily. Up to 5x per day with insulin bid (NPH) and tid (Regular) 500 each 12   LORazepam (ATIVAN) 0.5 MG tablet Take 0.5 tablets (0.25 mg total) by mouth daily as needed for anxiety. 30 tablet 5   Multiple Vitamins-Minerals (CENTRUM SILVER 50+WOMEN) TABS Take 1 tablet by mouth daily with breakfast.     mupirocin ointment (BACTROBAN) 2 % Apply 1 application topically 2 (two) times daily. Right index finger and left forearm 30 g 2   polyethylene glycol powder (GLYCOLAX/MIRALAX) 17 GM/SCOOP powder Take 17 g by mouth daily as needed for moderate constipation or severe constipation. Can take up to 2x per day prn. Mix with 8 ounces of liquid 3350 g 11   rosuvastatin (CRESTOR) 20 MG tablet TAKE 1 TABLET BY MOUTH EVERY DAY 90 tablet 1   spironolactone (ALDACTONE) 25 MG tablet Take 1 tablet (25 mg total) by mouth daily. 90 tablet 3   torsemide (DEMADEX) 10 MG tablet Take 10 mg by mouth daily.     ammonium lactate (AMLACTIN) 12 % lotion Apply 1  application topically 2 (two) times daily. Apply to feet once daily. Do not apply between toes. (Patient not taking: No sig reported) 400 g 4   meclizine (ANTIVERT) 12.5 MG tablet Take 1 tablet (12.5 mg total) by mouth daily as needed for dizziness. (Patient not taking: Reported on 02/13/2021) 90 tablet 2   No current facility-administered medications for this visit.     Allergies:   Celexa [citalopram], Jardiance [empagliflozin], Tape, Penicillin v, and Penicillins   Social History:  The patient  reports that she has never smoked. She has never used smokeless tobacco. She reports that she does not drink alcohol and does not use drugs.   Family History:   family history includes Diabetes in her mother, sister, and another family member; Healthy in her father; Heart disease in her sister; Hypertension in her mother; Stroke in her mother.    Review of Systems: Review of Systems  Constitutional: Negative.   Eyes:  Negative.   Respiratory: Negative.    Cardiovascular:  Positive for leg swelling.  Gastrointestinal: Negative.   Genitourinary: Negative.   Musculoskeletal: Negative.   Neurological: Negative.   Psychiatric/Behavioral: Negative.    All other systems reviewed and are negative.   PHYSICAL EXAM: VS:  BP 115/60 (BP Location: Right Arm, Patient Position: Sitting, Cuff Size: Large)   Pulse 72   Ht _0  (1.6 m)   Wt 257 lb (116.6 kg)   SpO2 98%   BMI 45.53 kg/m  , BMI Body mass index is 45.53 kg/m.  Constitutional:  oriented to person, place, and time. No distress.  Obese HENT:  Head: Grossly normal Eyes:  no discharge. No scleral icterus.  Neck: No JVD, no carotid bruits  Cardiovascular: Regular rate and rhythm, no murmurs appreciated Trace pitting LE edema Pulmonary/Chest: Clear to auscultation bilaterally, no wheezes or rails Abdominal: Soft.  no distension.  no tenderness.  Musculoskeletal: Normal range of motion Neurological:  normal muscle tone. Coordination  normal. No atrophy Skin: Skin warm and dry Psychiatric: normal affect, pleasant   Recent Labs: 12/15/2020: ALT 24; B Natriuretic Peptide 34.0; Magnesium 1.9; TSH 3.367 12/29/2020: BUN 16; Creatinine, Ser 1.17; Hemoglobin 13.8; Platelets 240.0; Potassium 4.2; Sodium 137    Lipid Panel Lab Results  Component Value Date   CHOL 119 12/15/2020   HDL 46 12/15/2020   LDLCALC 40 12/15/2020   TRIG 165 (H) 12/15/2020      Wt Readings from Last 3 Encounters:  02/13/21 257 lb (116.6 kg)  12/29/20 264 lb (119.7 kg)  12/16/20 261 lb 3.2 oz (118.5 kg)      ASSESSMENT AND PLAN:  Essential hypertension, benign Given her significant leg swelling recommend she stop amlodipine Instead will start hydralazine 70m 3 times daily  Venous insufficiency [place] vein and [place] which may help her symptoms to some degree Likely exacerbated by morbid obesity  Mixed hyperlipidemia On crestor 10 Cholesterol at goal  Uncontrolled type 2 diabetes mellitus with stage 3 chronic kidney disease, with long-term current use of insulin (HCC) Uncontrolled, A1C around 10 Long history of poorly controlled, needs to work with endocrine  CKD (chronic kidney disease) stage 3, GFR 30-59 ml/min Plan: Sees Dr. SCandiss NorseOn torsemide 10 sdaily  Morbid obesity ([place] Unable to exercise. Obesity hypoventilation  Presenting today in wheelchair, recommended diet restriction  [place] diastolic [place] Recommended she take extra torsemide as needed for shortness of breath, ankle swelling.  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  I, Jesus Reyes am acting as a sEducation administratorfor TIda Rogue M.D., Ph.D.  I, TIda Rogue M.D. Ph.D., have reviewed the above documentation for accuracy and completeness, and I agree with the above.    Signed, TEsmond Plants M.D., Ph.D. 02/13/2021  [facility] [place] [phone]

## 2021-02-13 ENCOUNTER — Other Ambulatory Visit: Payer: Self-pay

## 2021-02-13 ENCOUNTER — Ambulatory Visit (INDEPENDENT_AMBULATORY_CARE_PROVIDER_SITE_OTHER): Payer: Medicare Other | Admitting: Cardiovascular Disease

## 2021-02-13 ENCOUNTER — Encounter: Payer: Self-pay | Admitting: Cardiovascular Disease

## 2021-02-13 VITALS — BP 115/60 | HR 72 | Ht 63.0 in | Wt 257.0 lb

## 2021-02-13 DIAGNOSIS — I6523 Occlusion and stenosis of bilateral carotid arteries: Secondary | ICD-10-CM

## 2021-02-13 DIAGNOSIS — G458 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: Secondary | ICD-10-CM

## 2021-02-13 DIAGNOSIS — I10 Essential (primary) hypertension: Secondary | ICD-10-CM

## 2021-02-13 DIAGNOSIS — I700 Atherosclerosis of aorta: Secondary | ICD-10-CM

## 2021-02-13 DIAGNOSIS — I739 Peripheral vascular disease, unspecified: Secondary | ICD-10-CM

## 2021-02-13 DIAGNOSIS — I6521 Occlusion and stenosis of right carotid artery: Secondary | ICD-10-CM | POA: Diagnosis not present

## 2021-02-13 DIAGNOSIS — E785 Hyperlipidemia, unspecified: Secondary | ICD-10-CM

## 2021-02-13 DIAGNOSIS — I5032 Chronic diastolic (congestive) heart failure: Secondary | ICD-10-CM

## 2021-02-13 MED ORDER — HYDRALAZINE HCL 50 MG PO TABS: 50.0000 mg | ORAL_TABLET | Freq: Three times a day (TID) | ORAL | 3 refills | Status: DC

## 2021-02-13 NOTE — Patient Instructions (Addendum)
Medication Instructions:  Stop amlodipine  Start hydralazine 50 mg three times a day Monitor blood pressure (2 hours after medication)  If you need a refill on your cardiac medications before your next appointment, please call your pharmacy.   Lab work: No new labs needed  Testing/Procedures: No new testing needed  Follow-Up: At [facility], you and your health needs are our priority.  As part of our continuing mission to provide you with exceptional heart care, we have created designated Provider Care Teams.  These Care Teams include your primary Cardiologist (physician) and Advanced Practice Providers (APPs -  Physician Assistants and Nurse Practitioners) who all work together to provide you with the care you need, when you need it.  You will need a follow up appointment in 3 months, APP ok  Providers on your designated Care Team:   Murray Hodgkins, NP Christell Faith, PA-C Marrianne Mood, PA-C Cadence Martin, [place]  COVID-19 Vaccine Information can be found at: [email or web address] For questions related to vaccine distribution or appointments, please email [email or web address] or call [phone].

## 2021-02-14 ENCOUNTER — Telehealth: Payer: Self-pay | Admitting: Cardiovascular Disease

## 2021-02-14 NOTE — Telephone Encounter (Signed)
Pt c/o medication issue:  1. Name of Medication: hydralazine 50 mg po TID  2. How are you currently taking this medication (dosage and times per day)? 50 mg po TID  3. Are you having a reaction (difficulty breathing--STAT)? Change yesterday from amlodipine 10 mg po q d   4. What is your medication issue? Patient concerned about med change and new rx being a higher dose .  Patient lives alone and is concerned about safety  .     Patient reports she does not have a way to check bp at home.

## 2021-02-14 NOTE — Telephone Encounter (Signed)
Was able to reach Alexis Farmer Kitchen via phone regarding her recent medication change from yesterday from her OV. Educated pt as to why Dr. Rockey Situ changed her medication  Per OV notes from yesterday (02/13/2021) Essential hypertension, benign Given her significant leg swelling recommend she stop amlodipine Instead will start hydralazine '50mg'$  3 times daily  Pt concern for the higher dose from 10 mg to 50 mg, advised pt that both medication work differently based on their chemical make-up and their compound, as well as how her body reacts with the medication, her heart, and kidneys. The amlodipine is daily as it has a longer active life as the hydralazine is more short acting and a shorter-active as to why it is TID. Pt verbalized understanding.  Alexis Farmer reports "I do not have ways to check my BP, I can't do it every day as no one here to help me". Granddaughter comes once a week, she will have her check BP at that time,  Suggest a Wrist BP cuff monitor, as yesterday in office she reports she could not take her BP in her left arm and right arm "is not accurate, both read wrong and differently".And a wrist cuff she should be able to assess her BP daily, should be easier for pt to use. Pt reports she will "look into that in a few weeks when I have money and time to get somewhere"  Pt concern that she lives alone and what if something was to happen to her as her medications are changing and she can't monitor her BP daily as suggested. Advised pt to look into life alert monitors, could call insurance to see what type of assistance they offer with medical alert devices.  Also educated pt on possible sign of low BP, as in dizziness when standing or changing positions, light headedness, tiredness or weakness, but strongly encourage pt to monitor BP with BP cuff device at home.  Alexis Farmer verbalized understanding, very grateful for the return call and educations, otherwise all questions or concerns were  address and no additional concerns at this time. Agreeable to plan, will call back for anything further.

## 2021-02-21 NOTE — Addendum Note (Signed)
Addended by: Janan Ridge on: 02/21/2021 03:54 PM   Modules accepted: Orders

## 2021-03-01 ENCOUNTER — Ambulatory Visit: Payer: Medicare Other | Admitting: *Deleted

## 2021-03-01 DIAGNOSIS — E1129 Type 2 diabetes mellitus with other diabetic kidney complication: Secondary | ICD-10-CM

## 2021-03-01 DIAGNOSIS — I160 Hypertensive urgency: Secondary | ICD-10-CM

## 2021-03-01 DIAGNOSIS — I5032 Chronic diastolic (congestive) heart failure: Secondary | ICD-10-CM

## 2021-03-01 NOTE — Chronic Care Management (AMB) (Signed)
  Care Management   Follow Up Note   03/01/2021 Name: Alexis Farmer MRN: [id number] DOB: 06-25-1940   Referred by: McLean-Scocuzza, Nino Glow, MD Reason for referral : Chronic Care Management (DM, CHF)   Successful telephone outreach to patient for [facility] telephone assessment.  Patient answered and stated she not a home; requested call back another day and time.  Follow Up Plan: The care management team will reach out to the patient again over the next 30 business days per patient request.   [name] RN, MSN RN Care Management Coordinator [place] [phone] Onie [email or web address]

## 2021-03-03 ENCOUNTER — Ambulatory Visit (INDEPENDENT_AMBULATORY_CARE_PROVIDER_SITE_OTHER): Payer: Medicare Other | Admitting: Podiatry

## 2021-03-03 ENCOUNTER — Encounter: Payer: Self-pay | Admitting: Podiatry

## 2021-03-03 ENCOUNTER — Other Ambulatory Visit: Payer: Self-pay

## 2021-03-03 DIAGNOSIS — E1142 Type 2 diabetes mellitus with diabetic polyneuropathy: Secondary | ICD-10-CM

## 2021-03-03 DIAGNOSIS — I739 Peripheral vascular disease, unspecified: Secondary | ICD-10-CM | POA: Diagnosis not present

## 2021-03-03 DIAGNOSIS — M79674 Pain in right toe(s): Secondary | ICD-10-CM

## 2021-03-03 DIAGNOSIS — B351 Tinea unguium: Secondary | ICD-10-CM | POA: Diagnosis not present

## 2021-03-03 DIAGNOSIS — M2141 Flat foot [pes planus] (acquired), right foot: Secondary | ICD-10-CM

## 2021-03-03 DIAGNOSIS — M2142 Flat foot [pes planus] (acquired), left foot: Secondary | ICD-10-CM

## 2021-03-03 DIAGNOSIS — M79675 Pain in left toe(s): Secondary | ICD-10-CM | POA: Diagnosis not present

## 2021-03-08 ENCOUNTER — Ambulatory Visit (INDEPENDENT_AMBULATORY_CARE_PROVIDER_SITE_OTHER): Payer: Medicare Other | Admitting: *Deleted

## 2021-03-08 DIAGNOSIS — E1165 Type 2 diabetes mellitus with hyperglycemia: Secondary | ICD-10-CM

## 2021-03-08 DIAGNOSIS — I10 Essential (primary) hypertension: Secondary | ICD-10-CM

## 2021-03-08 DIAGNOSIS — I5032 Chronic diastolic (congestive) heart failure: Secondary | ICD-10-CM

## 2021-03-08 NOTE — Chronic Care Management (AMB) (Signed)
Chronic Care Management   CCM RN Visit Note  03/08/2021 Name: Alexis Farmer MRN: [id number] DOB: 1941/06/16  Subjective: Alexis Farmer is a 80 y.o. year old female who is a primary care patient of McLean-Scocuzza, Nino Glow, MD. The care management team was consulted for assistance with disease management and care coordination needs.    Engaged with patient by telephone for follow up visit in response to provider referral for case management and/or care coordination services.   Consent to Services:  The patient was given information about Chronic Care Management services, agreed to services, and gave verbal consent prior to initiation of services.  Please see initial visit note for detailed documentation.   Patient agreed to services and verbal consent obtained.   Assessment: Review of patient past medical history, allergies, medications, health status, including review of consultants reports, laboratory and other test data, was performed as part of comprehensive evaluation and provision of chronic care management services.   SDOH (Social Determinants of Health) assessments and interventions performed:    CCM Care Plan  Allergies  Allergen Reactions   Celexa [Citalopram]     Diarrhea upset stomach    Jardiance [Empagliflozin] Other (See Comments)    Reaction not recalled   Tape Other (See Comments)    Leaves the skin "raw" if left on for a period of time- tolerates paper tape   Penicillin V Rash   Penicillins Rash    Has patient had a PCN reaction causing immediate rash, facial/tongue/throat swelling, SOB or lightheadedness with hypotension: Yes Has patient had a PCN reaction causing severe rash involving mucus membranes or skin necrosis: No Has patient had a PCN reaction that required hospitalization No Has patient had a PCN reaction occurring within the last 10 years: Yes If all of the above answers are "NO", then may proceed with Cephalosporin use.     Outpatient  Encounter Medications as of 03/08/2021  Medication Sig   acetaminophen (TYLENOL) 500 MG tablet Take 500 mg by mouth every 6 (six) hours as needed for mild pain or headache.   ammonium lactate (AMLACTIN) 12 % lotion Apply 1 application topically 2 (two) times daily. Apply to feet once daily. Do not apply between toes. (Patient not taking: No sig reported)   Ascorbic Acid (VITAMIN C PO) Take 1 tablet by mouth daily.   aspirin EC 81 MG tablet Take 81 mg by mouth 2 (two) times a week. Swallow whole.   blood glucose meter kit and supplies KIT Accu chek, Dx code E11.65, check 3 times daily   citalopram (CELEXA) 10 MG tablet Take 10 mg by mouth daily.   cloNIDine (CATAPRES) 0.2 MG tablet Take 1 tablet (0.2 mg total) by mouth 3 (three) times daily.   glucose blood test strip Use as instructed tid  Accuchek Guide   hydrALAZINE (APRESOLINE) 50 MG tablet Take 1 tablet (50 mg total) by mouth 3 (three) times daily.   insulin NPH Human (NOVOLIN N) 100 UNIT/ML injection 25 units each morning, and 15 units each evening.   insulin regular (NOVOLIN R) 100 units/mL injection Inject 0-0.08 mLs (0-8 Units total) into the skin 3 (three) times daily before meals. (Patient taking differently: Inject 15 Units into the skin See admin instructions. Inject 15 units into the skin two to three times a day with food, PER SLIDING SCALE)   Insulin Syringe-Needle U-100 (B-D INS SYR ULTRAFINE .5CC/30G) 30G X 1/2" 0.5 ML MISC 1 Device by Does not apply route daily. Up to 5x per day  with insulin bid (NPH) and tid (Regular)   LORazepam (ATIVAN) 0.5 MG tablet Take 0.5 tablets (0.25 mg total) by mouth daily as needed for anxiety.   meclizine (ANTIVERT) 12.5 MG tablet Take 1 tablet (12.5 mg total) by mouth daily as needed for dizziness. (Patient not taking: Reported on 02/13/2021)   Multiple Vitamins-Minerals (CENTRUM SILVER 50+WOMEN) TABS Take 1 tablet by mouth daily with breakfast.   mupirocin ointment (BACTROBAN) 2 % Apply 1 application  topically 2 (two) times daily. Right index finger and left forearm   polyethylene glycol powder (GLYCOLAX/MIRALAX) 17 GM/SCOOP powder Take 17 g by mouth daily as needed for moderate constipation or severe constipation. Can take up to 2x per day prn. Mix with 8 ounces of liquid   rosuvastatin (CRESTOR) 20 MG tablet TAKE 1 TABLET BY MOUTH EVERY DAY   spironolactone (ALDACTONE) 25 MG tablet Take 1 tablet (25 mg total) by mouth daily.   torsemide (DEMADEX) 10 MG tablet Take 10 mg by mouth daily.   No facility-administered encounter medications on file as of 03/08/2021.    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 ([place]) 05/30/2020   Retinopathy 05/30/2020   Primary hypertension 03/25/2020   Multinodular goiter 02/04/2020   Peripheral edema 12/22/2019   Proteinuria 12/22/2019   Constipation 11/17/2019   Recurrent falls 08/13/2019   Physical deconditioning 08/13/2019   Abdominal aortic atherosclerosis ([place]) 07/24/2019   Morbid obesity with BMI of 45.0-49.9, adult ([place]) 06/09/2019   Hypercalcemia 03/18/2019   Round hole, unspecified eye 02/06/2019   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   Disorder of rotator cuff 06/24/2018   Abnormal gait 03/26/2018   Fall 03/26/2018   Arthritis 03/26/2018   Thyroid nodule 01/20/2018   Dysuria 03/22/2017   Cervical spondylosis with radiculopathy  01/26/2017   Rectal hemorrhage 07/29/2016   Carotid artery stenosis 07/19/2016   PAD (peripheral artery disease) ([place]) 07/19/2016   Swelling of lower leg 07/19/2016   Bradycardia 07/17/2016   Postmenopausal bleeding 07/17/2016   Chronic diastolic CHF (congestive heart failure) ([place]) 05/31/2016   Cough 01/25/2016   Chronic fatigue 11/15/2015   Depression, recurrent ([place]) 10/13/2015   Osteoarthritis 10/13/2015   Vertigo 10/13/2015   Anxiety disorder 09/13/2015   Gastrointestinal hemorrhage 08/03/2015   Hyperglycemia due to type 2 diabetes mellitus ([place]) 03/11/2015   Mixed hyperlipidemia 08/26/2014   Essential hypertension 04/08/2014   Diabetes mellitus type II, uncontrolled ([place]) 04/08/2014   Chronic kidney disease, stage 2 (mild) 04/08/2014   Edema of foot 04/08/2014   Morbid obesity ([place]) 04/08/2014   Low back pain 04/08/2014   Diabetic polyneuropathy ([place]) 04/08/2014   Type 2 diabetes mellitus with other diabetic kidney complication ([place]) 03/55/9741    Conditions to be addressed/monitored:CHF, HTN, and DMII  Care Plan : Diabetes Type 2 (Adult)  Updates made by Leona Singleton, RN since 03/08/2021 12:00 AM     Problem: Glycemic Management (Diabetes, Type 2)   Priority: High  Long-Range Goal: Patient will verbalize decrease in Hgb A1C by 0.3 points within the next 90 days   Start Date: 09/28/2020  Expected End Date: 08/15/2021  This Visit's Progress: On track  Recent Progress: On track  Priority: Medium  Note:   Objective:  Lab Results  Component Value Date   HGBA1C 9.8 (H) 12/15/2020  Current Barriers:  Knowledge Deficits related to basic Diabetes pathophysiology and self care/management as evidenced by continued elevated blood sugars and Hgb A1C increased to 9.8.  Recent hospitalization for anxiety and elevated troponins.  Discharged on 12/16/20.  Continues to spend more time at her granddaughters house with her family.  States this continues help a lot with her  anxiety.  Fasting blood sugar this morning was 143 (ranges vary as she tries to minimize lows).  Acknowledges she does not eat correct at times to manage her diabetes.  Reports being compliant with all her medications and insulins.   Lack of self motivation to self management of diabetes Case Manager Clinical Goal(s):  patient will demonstrate improved adherence to prescribed treatment plan for diabetes self care/management as evidenced by: at least 4 times a day monitoring and recording of CBG,  adherence to ADA/ carb modified diet, adherence to prescribed medication regimen, contacting provider for new or worsened symptoms or questions Interventions:  Collaboration with McLean-Scocuzza, Nino Glow, MD regarding development and update of comprehensive plan of care as evidenced by provider attestation and co-signature Inter-disciplinary care team collaboration (see longitudinal plan of care) Provided education to patient about basic DM disease process Reviewed medications with patient and discussed importance of medication adherence Discussed plans with patient for ongoing care management follow up and provided patient with direct contact information for care management team Provided patient with verbal educational materials related to hypo and hyperglycemia and importance of correct treatment Reviewed scheduled/upcoming provider appointments including:  eye exam on 11/15/20 Advised patient, providing education and rationale, to check cbg at least 4 times a day and record, calling primary care provider or endocrinologist after consultation for findings outside established parameters.   Sent Diabetes Education and encouraged patient to review information Discussed diabetic carbohydrate modified diet and foods to limit or avoid; Provided medical nutrition therapy and development of individualized eating Discussed current Hgb A1C and what is normal and what is patient's goal; per patient her goal is below  10-discussed and encouraged patient to review goal with provider and to consider changing goal to lower Promoted self-monitoring of blood glucose levels.  Assessed and addressed barriers to management plan, such as food insecurity, age, developmental ability, depression, anxiety, fear of hypoglycemia or weight gain, as well as medication cost, side effects and complicated regimen Encouraged to keep and attend scheduled medical appointments Discussed anxiety and new medications; encouraged to use diversional activities to help with anxiety; offered CCM Social Work referral for depression and anxiety resources (patient declines at this time) Emotional support and empathy provided to patient Encouraged to spend more quality time with family Discussed diabetic shoes and foot care Reviewed current blood sugar readings and discussed hypoglycemia prevention and ways to help achieve better glycemic control; discussed how to treat hypoglycemia to prevent rebound hyperglycemia Patient Goals/Self-Care Activities: Check blood sugar at least 4 times a day Check blood sugar if I feel it is too high or too low Enter blood sugar readings and medication/insulin into daily log Take the blood sugar log to all doctor visits  Discuss goal A1C with endocrinology  Take insulins as prescribed (25 Units  in am and 15 Units in pm) Follow Up Plan: The care management team will reach out to the patient again over the next 30 business days.       Care Plan : Heart Failure (Adult)  Updates made by Leona Singleton, RN since 03/08/2021 12:00 AM     Problem: Symptom Exacerbation (Heart Failure)   Priority: Medium     Long-Range Goal: Patient will report obtaining scale for home within the next 90 days   Start Date: 09/28/2020  Expected End Date: 06/16/2021  This Visit's Progress: Not on track  Recent Progress: On track  Priority: Medium  Note:   Current Barriers:  Knowledge deficit related to basic heart failure  pathophysiology and self care management as evidenced by not knowing heart failure diagnosis and treatment/self care management.  Reports now not having scale to weigh at home.  Does report compliance with fluid medications.  Reports lower extremity edema is better since stopping Norvasc.  Has been checking blood pressures, right arm pressures 130-150/60's while left arm runs significantly higher 190-215/90-100's.  Has upcoming vascular consultation Knowledge Deficits related to heart failure medications Case Manager Clinical Goal(s):  patient will obtain scale to weigh self daily and record patient will verbalize understanding of Heart Failure Action Plan and when to call doctor patient will take all Heart Failure mediations as prescribed patient will weigh daily and record (notifying MD of 3 lb weight gain over night or 5 lb in a week) Interventions:  Collaboration with McLean-Scocuzza, Nino Glow, MD regarding development and update of comprehensive plan of care as evidenced by provider attestation and co-signature Inter-disciplinary care team collaboration (see longitudinal plan of care) Basic overview and discussion of pathophysiology of Heart Failure reviewed  Provided verbal education on low sodium diet Assessed need for readable accurate scales in home and encouraged patient to obtain scale with large print or verbally read out weight to patient; stressed the importance of daily weight monitoring and need for scale in the home Provided education about placing scale on hard, flat surface Advised patient to weigh each morning after emptying bladder Discussed importance of daily weight and advised patient to weigh and record daily Reviewed role of diuretics in prevention of fluid overload and management of heart failure Barriers to lifestyle changes reviewed and addressed; barriers to treatment reviewed and addressed Healthy lifestyle promoted Self-awareness of signs/symptoms of worsening  disease encouraged Assessed understanding of adherence and barriers to treatment plan, as well as lifestyle changes; discussed strategies to address barriers Provided patient ear for venting outlet; emotional support and empathy provided to patient  Patient Goals/Self-Care Activities: Call office if I gain more than 2 pounds in one day or 5 pounds in one week Obtain home scale Use salt in moderation Watch for swelling in feet, ankles and legs every day; elevate legs when sitting Weigh myself daily and write weights in log Take log to all provider appointments Take BP in right arm, recording reading for provider review Follow Up Plan: The care management team will reach out to the patient again over the next 30 business days.        Plan:The care management team will reach out to the patient again over the next 30   business days.  [name] RN, MSN RN Care Management Coordinator [place] [phone] [email or web address]

## 2021-03-08 NOTE — Patient Instructions (Signed)
Visit Information  PATIENT GOALS:  Goals Addressed             This Visit's Progress    (RNCM) Monitor and Manage My Blood Sugar   On track    Timeframe:  Long-Range Goal Priority:  Medium Start Date:    09/28/2020                         Expected End Date:  06/16/21                      Follow Up Date 04/14/21   Check blood sugar at least 4 times a day Check blood sugar if I feel it is too high or too low Enter blood sugar readings and medication/insulin into daily log Take the blood sugar log to all doctor visits  Discuss goal A1C with endocrinology  Take insulins as prescribed (25 Units in am and 15 Units in pm)   Why is this important?   Checking your blood sugar at home helps to keep it from getting very high or very low.  Writing the results in a diary or log helps the doctor know how to care for you.  Your blood sugar log should have the time, date and the results.  Also, write down the amount of insulin or other medicine that you take.  Other information, like what you ate, exercise done and how you were feeling, will also be helpful.     Notes:      (RNCM) Track and Manage Fluids and Swelling-Heart Failure   Not on track    Timeframe:  Long-Range Goal Priority:  Medium Start Date:   09/28/20                          Expected End Date:  06/16/21                     Follow Up Date 04/14/21    Call office if I gain more than 2 pounds in one day or 5 pounds in one week Obtain home scale Use salt in moderation Watch for swelling in feet, ankles and legs every day; elevate legs when sitting Weigh myself daily and write weights in log Take log to all provider appointments Take BP in right arm, recording reading for provider review   Why is this important?   It is important to check your weight daily and watch how much salt and liquids you have.  It will help you to manage your heart failure.    Notes:         The patient verbalized understanding of  instructions, educational materials, and care plan provided today and declined offer to receive copy of patient instructions, educational materials, and care plan.   The care management team will reach out to the patient again over the next 30 business days.   [name] RN, MSN RN Care Management Coordinator [place] [phone] [email or web address]

## 2021-03-10 NOTE — Progress Notes (Signed)
Subjective:  Patient ID: Alexis Farmer, female    DOB: [birth year],  MRN: [id number]  Alexis Farmer presents to clinic today for at risk foot care. Pt has h/o NIDDM with PAD and painful thick toenails that are difficult to trim. Pain interferes with ambulation. Aggravating factors include wearing enclosed shoe gear. Pain is relieved with periodic professional debridement.  Patient states blood glucose was 152 mg/dl today. Last A1c was 9.8%%. She states she is now seeing Dr. Mee Hives for Endocrinology and they are working on getting her A1c down.   Patient also states her lower extremity edema has improved and she is seeing Vascular Surgery Team, Dr. Ida Rogue.  She notes no new pedal problems on today's visit.  PCP is McLean-Scocuzza, Nino Glow, MD , and last visit was 12/29/2020.  Allergies  Allergen Reactions   Celexa [Citalopram]     Diarrhea upset stomach    Jardiance [Empagliflozin] Other (See Comments)    Reaction not recalled   Tape Other (See Comments)    Leaves the skin "raw" if left on for a period of time- tolerates paper tape   Penicillin V Rash   Penicillins Rash    Has patient had a PCN reaction causing immediate rash, facial/tongue/throat swelling, SOB or lightheadedness with hypotension: Yes Has patient had a PCN reaction causing severe rash involving mucus membranes or skin necrosis: No Has patient had a PCN reaction that required hospitalization No Has patient had a PCN reaction occurring within the last 10 years: Yes If all of the above answers are "NO", then may proceed with Cephalosporin use.     Review of Systems: Negative except as noted in the HPI. Objective:   Constitutional Alexis Farmer is a pleasant 80 y.o. African American female, obese in NAD. AAO x 3.   Vascular Capillary fill time to digits <3 seconds b/l lower extremities. Nonpalpable DP pulse(s) b/l lower extremities. Nonpalpable PT pulse(s) b/l lower extremities. Pedal hair  absent. Lower extremity skin temperature gradient within normal limits. No pain with calf compression b/l. Nonpitting edema noted b/l lower extremities. No cyanosis or clubbing noted.  Neurologic Normal speech. Oriented to person, place, and time. Protective sensation decreased with 10 gram monofilament b/l.  Dermatologic Skin warm and supple b/l lower extremities. No open wounds b/l lower extremities. No interdigital macerations b/l lower extremities. Toenails 1-5 b/l elongated, discolored, dystrophic, thickened, crumbly with subungual debris and tenderness to dorsal palpation.  Orthopedic: Normal muscle strength 5/5 to all lower extremity muscle groups bilaterally. Pes planus deformity noted b/l lower extremities.   Radiographs: None  ABI studies from April, 2022: Impression: Noncompressible vessels RLE and LLE Toe brachial pressures: Normal  Assessment:   1. Pain due to onychomycosis of toenails of both feet   2. Pes planus of both feet   3. PAD (peripheral artery disease) ([place])   4. Diabetic peripheral neuropathy associated with type 2 diabetes mellitus ([place])    Plan:  Patient was evaluated and treated and all questions answered. Consent given for treatment as described below: -Examined patient.  -Last ABIs performed April, 2022. Noncompressible arteries and normal toe pressures. -Continue diabetic foot care principles: inspect feet daily, monitor glucose as recommended by PCP and/or Endocrinologist, and follow prescribed diet per PCP, Endocrinologist and/or dietician. -Patient to continue soft, supportive shoe gear daily. -Toenails 1-5 b/l were debrided in length and girth with sterile nail nippers and dremel without iatrogenic bleeding.  -Patient to report any pedal injuries to medical professional immediately. -Patient/POA to call should  there be question/concern in the interim.  Return in about 3 months (around 06/02/2021).  [name], DPM

## 2021-03-13 ENCOUNTER — Telehealth: Payer: Self-pay | Admitting: Cardiovascular Disease

## 2021-03-13 NOTE — Telephone Encounter (Signed)
Pt c/o medication issue:  1. Name of Medication: hydralazine   2. How are you currently taking this medication (dosage and times per day)? 50 MG 1 tablet 3 times daily   3. Are you having a reaction (difficulty breathing--STAT)? Blood sugar rising   4. What is your medication issue? Patient calling, thinks that this medication could be affecting her blood sugar - says she only ate vegetables for lunch and it is 315.  Please call to discuss.

## 2021-03-13 NOTE — Telephone Encounter (Signed)
Was able to reach out to Mrs. Lorah regarding her concern for elevated BS and her newly started hydralazine, advised on pharmacy thoughts of the two  "Hydralazine does not affect blood sugar. She should reach out to her MD who manages her DM for follow up."  Mrs. Flow thankful for the return call, reports she thinks it may be here "worrying" that has he BS up, educated pt that certain vegetables can be high in carbs that can affect her BS as well.  Pt reports understanding, also stated the hydralazine has been working great with her since changing the meds.  Otherwise all questions or concerns were address and no additional concerns at this time. Will reach out to PCP, if BS continues to run high, will call back for anything further.

## 2021-03-13 NOTE — Telephone Encounter (Signed)
Hydralazine does not affect blood sugar. She should reach out to her MD who manages her DM for follow up.

## 2021-03-13 NOTE — Progress Notes (Deleted)
[facility] [place]  ASSESSMENT / PLAN: Alexis Farmer is a 80 y.o. female with chronic venous insufficiency of *** lower extremity causing *** (C*** disease).  Venous duplex is significant for *** Recommend compression and elevation for symptomatic relief. ***Follow up with me in three months to discuss saphenous vein ablation. ***Follow up with me as needed.  CHIEF COMPLAINT: ***  HISTORY OF PRESENT ILLNESS: Alexis Farmer is a 80 y.o. female ***  VASCULAR SURGICAL HISTORY: ***  VASCULAR RISK FACTORS: {FINDINGS; POSITIVE NEGATIVE:[phone]} history of stroke / transient ischemic attack. {FINDINGS; POSITIVE NEGATIVE:[phone]} history of coronary artery disease. *** history of PCI. *** history of CABG.  {FINDINGS; POSITIVE NEGATIVE:[phone]} history of diabetes mellitus. Last A1c ***. {FINDINGS; POSITIVE NEGATIVE:[phone]} history of smoking. *** actively smoking. {FINDINGS; POSITIVE NEGATIVE:[phone]} history of hypertension. *** drug regimen with *** control. {FINDINGS; POSITIVE NEGATIVE:[phone]} history of chronic kidney disease.  Last GFR ***. CKD {stage:30421363}. {FINDINGS; POSITIVE NEGATIVE:[phone]} history of chronic obstructive pulmonary disease, treated with ***.  FUNCTIONAL STATUS: ECOG performance status: {findings; ecog performance status:31780} Ambulatory status: {TNHAmbulation:25868}  Past Medical History:  Diagnosis Date   Arthritis    Chickenpox    Diabetes mellitus without complication ([place])    one elevated reading/ no treatment   Diverticulitis    GI bleed    High cholesterol    History of blood transfusion    Hypertension    Renal insufficiency     Past Surgical History:  Procedure Laterality Date   APPENDECTOMY     CHOLECYSTECTOMY     ECTOPIC PREGNANCY SURGERY     EYE SURGERY     bilateral cataracts   EYE SURGERY     02/11/2019 repair hole in right eye    gallbladder      HYSTEROSCOPY WITH D & C N/A  10/26/2016   Procedure: DILATATION AND CURETTAGE /HYSTEROSCOPY;  Surgeon: Benjaman Kindler, MD;  Location: [facility] ORS;  Service: Gynecology;  Laterality: N/A;   HYSTEROSCOPY WITH D & C N/A 07/07/2018   Procedure: DILATATION AND CURETTAGE /HYSTEROSCOPY;  Surgeon: Benjaman Kindler, MD;  Location: [facility] OR[facility];  Service: Gynecology;  Laterality: N/A;   THYROIDECTOMY, PARTIAL      Family History  Problem Relation Age of Onset   Diabetes Mother    Hypertension Mother    Stroke Mother    Diabetes Other    Healthy Father    Diabetes Sister    Heart disease Sister     Social History   Socioeconomic History   Marital status: Widowed    Spouse name: Not on file   Number of children: 1   Years of education: 75   Highest education level: 12th grade  Occupational History   Occupation: retired    Comment: hx of Pharmacist, hospital, Secretary/administrator, 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  Tobacco Use   Smoking status: Never   Smokeless tobacco: Never  Vaping Use   Vaping Use: Never used  Substance and Sexual Activity   Alcohol use: No   Drug use: No   Sexual activity: Not Currently  Other Topics Concern   Not on file  Social History Narrative   Lives alone    From [place]   Widowed - was married 3 times starting at age 15    hx of seamtress, 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   Social Determinants of Health   Financial Resource Strain: Low Risk    Difficulty of Paying Living Expenses: Not hard  at all  Food Insecurity: No Food Insecurity   Worried About Charity fundraiser in the Last Year: Never true   Ran Out of Food in the Last Year: Never true  Transportation Needs: No Transportation Needs   Lack of Transportation (Medical): No   Lack of Transportation (Non-Medical): No  Physical Activity: Inactive   Days of Exercise per Week: 0 days   Minutes of Exercise per Session: 0 min  Stress: Stress Concern Present   Feeling of  Stress : To some extent  Social Connections: Socially Isolated   Frequency of Communication with Friends and Family: More than three times a week   Frequency of Social Gatherings with Friends and Family: More than three times a week   Attends Religious Services: Never   Marine scientist or Organizations: No   Attends Archivist Meetings: Never   Marital Status: Widowed  Human resources officer Violence: Not At Risk   Fear of Current or Ex-Partner: No   Emotionally Abused: No   Physically Abused: No   Sexually Abused: No    Allergies  Allergen Reactions   Celexa [Citalopram]     Diarrhea upset stomach    Jardiance [Empagliflozin] Other (See Comments)    Reaction not recalled   Tape Other (See Comments)    Leaves the skin "raw" if left on for a period of time- tolerates paper tape   Penicillin V Rash   Penicillins Rash    Has patient had a PCN reaction causing immediate rash, facial/tongue/throat swelling, SOB or lightheadedness with hypotension: Yes Has patient had a PCN reaction causing severe rash involving mucus membranes or skin necrosis: No Has patient had a PCN reaction that required hospitalization No Has patient had a PCN reaction occurring within the last 10 years: Yes If all of the above answers are "NO", then may proceed with Cephalosporin use.     Current Outpatient Medications  Medication Sig Dispense Refill   acetaminophen (TYLENOL) 500 MG tablet Take 500 mg by mouth every 6 (six) hours as needed for mild pain or headache.     ammonium lactate (AMLACTIN) 12 % lotion Apply 1 application topically 2 (two) times daily. Apply to feet once daily. Do not apply between toes. (Patient not taking: No sig reported) 400 g 4   Ascorbic Acid (VITAMIN C PO) Take 1 tablet by mouth daily.     aspirin EC 81 MG tablet Take 81 mg by mouth 2 (two) times a week. Swallow whole.     blood glucose meter kit and supplies KIT Accu chek, Dx code E11.65, check 3 times daily 1 each 0    citalopram (CELEXA) 10 MG tablet Take 10 mg by mouth daily.     cloNIDine (CATAPRES) 0.2 MG tablet Take 1 tablet (0.2 mg total) by mouth 3 (three) times daily. 270 tablet 3   glucose blood test strip Use as instructed tid  Accuchek Guide 300 each 12   hydrALAZINE (APRESOLINE) 50 MG tablet Take 1 tablet (50 mg total) by mouth 3 (three) times daily. 270 tablet 3   insulin NPH Human (NOVOLIN N) 100 UNIT/ML injection 25 units each morning, and 15 units each evening. 40 mL 11   insulin regular (NOVOLIN R) 100 units/mL injection Inject 0-0.08 mLs (0-8 Units total) into the skin 3 (three) times daily before meals. (Patient taking differently: Inject 15 Units into the skin See admin instructions. Inject 15 units into the skin two to three times a day with food,  PER SLIDING SCALE) 30 mL 11   Insulin Syringe-Needle U-100 (B-D INS SYR ULTRAFINE .5CC/30G) 30G X 1/2" 0.5 ML MISC 1 Device by Does not apply route daily. Up to 5x per day with insulin bid (NPH) and tid (Regular) 500 each 12   LORazepam (ATIVAN) 0.5 MG tablet Take 0.5 tablets (0.25 mg total) by mouth daily as needed for anxiety. 30 tablet 5   meclizine (ANTIVERT) 12.5 MG tablet Take 1 tablet (12.5 mg total) by mouth daily as needed for dizziness. (Patient not taking: Reported on 02/13/2021) 90 tablet 2   Multiple Vitamins-Minerals (CENTRUM SILVER 50+WOMEN) TABS Take 1 tablet by mouth daily with breakfast.     mupirocin ointment (BACTROBAN) 2 % Apply 1 application topically 2 (two) times daily. Right index finger and left forearm 30 g 2   polyethylene glycol powder (GLYCOLAX/MIRALAX) 17 GM/SCOOP powder Take 17 g by mouth daily as needed for moderate constipation or severe constipation. Can take up to 2x per day prn. Mix with 8 ounces of liquid 3350 g 11   rosuvastatin (CRESTOR) 20 MG tablet TAKE 1 TABLET BY MOUTH EVERY DAY 90 tablet 1   spironolactone (ALDACTONE) 25 MG tablet Take 1 tablet (25 mg total) by mouth daily. 90 tablet 3   torsemide (DEMADEX)  10 MG tablet Take 10 mg by mouth daily.     No current facility-administered medications for this visit.    REVIEW OF SYSTEMS:  '[X]'  denotes positive finding, '[ ]'  denotes negative finding Cardiac  Comments:  Chest pain or chest pressure: ***   Shortness of breath upon exertion:    Short of breath when lying flat:    Irregular heart rhythm:        Vascular    Pain in calf, thigh, or hip brought on by ambulation:    Pain in feet at night that wakes you up from your sleep:     Blood clot in your veins:    Leg swelling:         Pulmonary    Oxygen at home:    Productive cough:     Wheezing:         Neurologic    Sudden weakness in arms or legs:     Sudden numbness in arms or legs:     Sudden onset of difficulty speaking or slurred speech:    Temporary loss of vision in one eye:     Problems with dizziness:         Gastrointestinal    Blood in stool:     Vomited blood:         Genitourinary    Burning when urinating:     Blood in urine:        Psychiatric    Major depression:         Hematologic    Bleeding problems:    Problems with blood clotting too easily:        Skin    Rashes or ulcers:        Constitutional    Fever or chills:      PHYSICAL EXAM There were no vitals filed for this visit.  Constitutional: *** appearing. *** distress. Appears *** nourished.  Neurologic: CN ***. *** focal findings. *** sensory loss. Psychiatric: *** Mood and affect symmetric and appropriate. Eyes: *** No icterus. No conjunctival pallor. Ears, nose, throat: *** mucous membranes moist. Midline trachea.  Cardiac: *** rate and rhythm.  Respiratory: *** unlabored. Abdominal: *** soft, non-tender, non-distended.  Peripheral vascular: *** Extremity: *** edema. *** cyanosis. *** pallor.  Skin: *** gangrene. *** ulceration.  Lymphatic: *** Stemmer's sign. *** palpable lymphadenopathy.  PERTINENT LABORATORY AND RADIOLOGIC DATA  Most recent CBC CBC Latest Ref Rng & Units  12/29/2020 12/16/2020 12/15/2020  WBC 4.0 - 10.5 K/uL 7.8 7.4 8.2  Hemoglobin 12.0 - 15.0 g/dL 13.8 13.1 13.8  Hematocrit 36.0 - 46.0 % 41.4 39.2 42.6  Platelets 150.0 - 400.0 K/uL 240.0 178 249     Most recent CMP CMP Latest Ref Rng & Units 12/29/2020 12/16/2020 12/15/2020  Glucose 70 - 99 mg/dL 146(H) 277(H) 268(H)  BUN 6 - 23 mg/dL '16 10 11  ' Creatinine 0.40 - 1.20 mg/dL 1.17 1.14(H) 1.17(H)  Sodium 135 - 145 mEq/L 137 135 135  Potassium 3.5 - 5.1 mEq/L 4.2 4.3 4.0  Chloride 96 - 112 mEq/L 96 99 98  CO2 19 - 32 mEq/L 32 27 28  Calcium 8.4 - 10.5 mg/dL 10.2 9.3 9.7  Total Protein 6.5 - 8.1 g/dL - - 7.1  Total Bilirubin 0.3 - 1.2 mg/dL - - 1.1  Alkaline Phos 38 - 126 U/L - - 47  AST 15 - 41 U/L - - 29  ALT 0 - 44 U/L - - 24    Renal function CrCl cannot be calculated (Patient's most recent lab result is older than the maximum 21 days allowed.).  Hgb A1c MFr Bld (%)  Date Value  12/15/2020 9.8 (H)    LDL Cholesterol  Date Value Ref Range Status  12/15/2020 40 0 - 99 mg/dL Final    Comment:           Total Cholesterol/HDL:CHD Risk Coronary Heart Disease Risk Table                     Men   Women  1/2 Average Risk   3.4   3.3  Average Risk       5.0   4.4  2 X Average Risk   9.6   7.1  3 X Average Risk  23.4   11.0        Use the calculated Patient Ratio above and the CHD Risk Table to determine the patient's CHD Risk.        ATP III CLASSIFICATION (LDL):  <100     mg/dL   Optimal  100-129  mg/dL   Near or Above                    Optimal  130-159  mg/dL   Borderline  160-189  mg/dL   High  >190     mg/dL   Very High Performed at [facility] [address] [place], [place] [address]    Direct LDL  Date Value Ref Range Status  10/28/2018 96.0 mg/dL Final    Comment:    Optimal:  <100 mg/dLNear or Above Optimal:  100-129 mg/dLBorderline High:  130-159 mg/dLHigh:  160-189 mg/dLVery High:  >190 mg/dL     Vascular Imaging: ***  Rozanne Heumann N. Stanford Breed,  MD [facility] Phone Number: [phone] 03/13/2021 2:50 PM  Total time spent on preparing this encounter including chart review, data review, collecting history, examining the patient, coordinating care for this {tnhtimebilling:26202}  Portions of this report may have been transcribed using voice recognition software.  Every effort has been made to ensure accuracy; however, inadvertent computerized transcription errors may still be present.

## 2021-03-14 ENCOUNTER — Ambulatory Visit (HOSPITAL_COMMUNITY)
Admission: RE | Admit: 2021-03-14 | Discharge: 2021-03-14 | Disposition: A | Discharge status: Home / Self Care | Payer: Medicare Other | Source: Ambulatory Visit | Attending: Vascular Surgery | Admitting: Vascular Surgery

## 2021-03-14 ENCOUNTER — Other Ambulatory Visit: Payer: Self-pay

## 2021-03-14 ENCOUNTER — Encounter: Payer: Medicare Other | Admitting: Vascular Surgery

## 2021-03-14 ENCOUNTER — Encounter (HOSPITAL_COMMUNITY): Payer: Self-pay

## 2021-03-14 DIAGNOSIS — M7989 Other specified soft tissue disorders: Secondary | ICD-10-CM

## 2021-03-17 DIAGNOSIS — I5032 Chronic diastolic (congestive) heart failure: Secondary | ICD-10-CM | POA: Diagnosis not present

## 2021-03-17 DIAGNOSIS — I160 Hypertensive urgency: Secondary | ICD-10-CM

## 2021-03-17 DIAGNOSIS — I10 Essential (primary) hypertension: Secondary | ICD-10-CM | POA: Diagnosis not present

## 2021-03-17 DIAGNOSIS — E1165 Type 2 diabetes mellitus with hyperglycemia: Secondary | ICD-10-CM

## 2021-03-17 DIAGNOSIS — E1129 Type 2 diabetes mellitus with other diabetic kidney complication: Secondary | ICD-10-CM

## 2021-04-08 ENCOUNTER — Other Ambulatory Visit: Payer: Self-pay | Admitting: Internal Medicine

## 2021-04-08 DIAGNOSIS — E1122 Type 2 diabetes mellitus with diabetic chronic kidney disease: Secondary | ICD-10-CM

## 2021-04-14 ENCOUNTER — Ambulatory Visit (INDEPENDENT_AMBULATORY_CARE_PROVIDER_SITE_OTHER): Payer: Medicare Other | Admitting: *Deleted

## 2021-04-14 DIAGNOSIS — I152 Hypertension secondary to endocrine disorders: Secondary | ICD-10-CM

## 2021-04-14 DIAGNOSIS — E1165 Type 2 diabetes mellitus with hyperglycemia: Secondary | ICD-10-CM

## 2021-04-14 DIAGNOSIS — R609 Edema, unspecified: Secondary | ICD-10-CM

## 2021-04-14 DIAGNOSIS — E1159 Type 2 diabetes mellitus with other circulatory complications: Secondary | ICD-10-CM

## 2021-04-14 DIAGNOSIS — I5032 Chronic diastolic (congestive) heart failure: Secondary | ICD-10-CM

## 2021-04-14 NOTE — Chronic Care Management (AMB) (Signed)
  Care Management   Follow Up Note   04/14/2021 Name: Alexis Farmer MRN: [id number] DOB: April 25, 1941   Referred by: McLean-Scocuzza, Nino Glow, MD Reason for referral : Chronic Care Management (CHF, DM II, HTN)   Successf ul telephone outreach to patient.  Patient states she is not home and getting ready for lunch, requesting call back another da and time.  Follow Up Plan: The care management team will reach out to the patient again over the next 20 days.   [name] RN, MSN RN Care Management Coordinator [place] [phone] [email or web address]

## 2021-04-17 ENCOUNTER — Other Ambulatory Visit: Payer: Self-pay | Admitting: Internal Medicine

## 2021-04-17 DIAGNOSIS — E1159 Type 2 diabetes mellitus with other circulatory complications: Secondary | ICD-10-CM | POA: Diagnosis not present

## 2021-04-17 DIAGNOSIS — E1165 Type 2 diabetes mellitus with hyperglycemia: Secondary | ICD-10-CM | POA: Diagnosis not present

## 2021-04-17 DIAGNOSIS — I152 Hypertension secondary to endocrine disorders: Secondary | ICD-10-CM

## 2021-04-17 DIAGNOSIS — I10 Essential (primary) hypertension: Secondary | ICD-10-CM

## 2021-04-17 DIAGNOSIS — I5032 Chronic diastolic (congestive) heart failure: Secondary | ICD-10-CM | POA: Diagnosis not present

## 2021-04-17 LAB — HM DIABETES EYE EXAM

## 2021-04-19 ENCOUNTER — Encounter: Payer: Self-pay | Admitting: Internal Medicine

## 2021-04-19 ENCOUNTER — Other Ambulatory Visit: Payer: Self-pay

## 2021-04-19 ENCOUNTER — Ambulatory Visit (INDEPENDENT_AMBULATORY_CARE_PROVIDER_SITE_OTHER): Payer: Medicare Other | Admitting: Internal Medicine

## 2021-04-19 VITALS — BP 160/86 | HR 86 | Temp 97.1°F | Ht 63.0 in | Wt 256.8 lb

## 2021-04-19 DIAGNOSIS — F419 Anxiety disorder, unspecified: Secondary | ICD-10-CM | POA: Diagnosis not present

## 2021-04-19 DIAGNOSIS — E1159 Type 2 diabetes mellitus with other circulatory complications: Secondary | ICD-10-CM

## 2021-04-19 DIAGNOSIS — F410 Panic disorder [episodic paroxysmal anxiety] without agoraphobia: Secondary | ICD-10-CM

## 2021-04-19 DIAGNOSIS — I152 Hypertension secondary to endocrine disorders: Secondary | ICD-10-CM

## 2021-04-19 DIAGNOSIS — Z1231 Encounter for screening mammogram for malignant neoplasm of breast: Secondary | ICD-10-CM

## 2021-04-19 DIAGNOSIS — E785 Hyperlipidemia, unspecified: Secondary | ICD-10-CM

## 2021-04-19 DIAGNOSIS — Z23 Encounter for immunization: Secondary | ICD-10-CM

## 2021-04-19 DIAGNOSIS — G458 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: Secondary | ICD-10-CM

## 2021-04-19 DIAGNOSIS — I10 Essential (primary) hypertension: Secondary | ICD-10-CM

## 2021-04-19 DIAGNOSIS — I890 Lymphedema, not elsewhere classified: Secondary | ICD-10-CM

## 2021-04-19 LAB — MICROALBUMIN / CREATININE URINE RATIO
Creatinine,U: 49.1 mg/dL
Microalb Creat Ratio: 25.4 mg/g (ref 0.0–30.0)
Microalb, Ur: 12.5 mg/dL — ABNORMAL HIGH (ref 0.0–1.9)

## 2021-04-19 MED ORDER — ROSUVASTATIN CALCIUM 20 MG PO TABS: ORAL_TABLET | ORAL | 3 refills | Status: DC

## 2021-04-19 MED ORDER — LORAZEPAM 0.5 MG PO TABS: 0.5000 mg | ORAL_TABLET | Freq: Every day | ORAL | 5 refills | Status: DC | PRN

## 2021-04-19 MED ORDER — CLONIDINE HCL 0.2 MG PO TABS: 0.2000 mg | ORAL_TABLET | Freq: Three times a day (TID) | ORAL | 3 refills | Status: DC

## 2021-04-19 NOTE — Patient Instructions (Addendum)
Lymphedema compression pumps for your legs [phone] Not available [address]   [place] [address]      Specialties     Vascular Surgery, Interventional Cardiology            Department    Lymphedema Lymphedema is swelling that is caused by the abnormal collection of lymph in the tissues under the skin. Lymph is excess fluid from the tissues in your body that is removed through the lymphatic system. This system is part of your body's defense system (immune system) and includes lymph nodes and lymph vessels. The lymph vessels collect and carry the excess fluid, fats, proteins, and waste from the tissues of the body to the bloodstream. This system also works to clean and remove bacteria and waste products from the body. Lymphedema occurs when the lymphatic system is blocked. When the lymph vessels or lymph nodes are blocked or damaged, lymph does not drain properly. This causes an abnormal buildup of lymph, which leads to swelling in the affected area. This may include the trunk area, or an arm or leg. Lymphedema cannot be cured by medicines, but various methods can be used to help reduce the swelling. What are the causes? The cause of this condition depends on the type of lymphedema that you have. Primary lymphedema is caused by the absence of lymph vessels or having abnormal lymph vessels at birth. Secondary lymphedema occurs when lymph vessels are blocked or damaged. Secondary lymphedema is more common. Common causes of lymph vessel blockage include: Skin infection, such as cellulitis. Infection by parasites (filariasis). Injury. Radiation therapy. Cancer. Formation of scar tissue. Surgery. What are the signs or symptoms? Symptoms of this condition include: Swelling of the arm or leg. A heavy or tight feeling in the arm or leg. Swelling of the feet, toes, or fingers. Shoes or rings may fit more tightly than before. Redness of the skin over the affected  area. Limited movement of the affected limb. Sensitivity to touch or discomfort in the affected limb. How is this diagnosed? This condition may be diagnosed based on: Your symptoms and medical history. A physical exam. Bioimpedance spectroscopy. In this test, painless electrical currents are used to measure fluid levels in your body. Imaging tests, such as: MRI. CT scan. Duplex ultrasound. This test uses sound waves to produce images of the vessels and the blood flow on a screen. Lymphoscintigraphy. In this test, a low dose of a radioactive substance is injected to trace the flow of lymph through your lymph vessels. Lymphangiography. In this test, a contrast dye is injected into the lymph vessel to help show blockages. How is this treated? If an underlying condition is causing the lymphedema, that condition will be treated. For example, antibiotic medicines may be used to treat an infection. Treatment for this condition will depend on the cause of your lymphedema. Treatment may include: Complete decongestive therapy (CDT). This is done by a certified lymphedema therapist to reduce fluid congestion. This therapy includes: Skin care. Compression wrapping of the affected area. Manual lymph drainage. This is a special massage technique that promotes lymph drainage out of a limb. Specific exercises. Certain exercises can help fluid move out of the affected limb. Compression. Various methods may be used to apply pressure to the affected limb to reduce the swelling. They include: Wearing compression stockings or sleeves on the affected limb. Wrapping the affected limb with special bandages. Surgery. This is usually done for severe cases only. For example, surgery may be done  if you have trouble moving the limb or if the swelling does not get better with other treatments. Follow these instructions at home: Self-care The affected area is more likely to become injured or infected. Take these steps  to help prevent infection: Keep the affected area clean and dry. Use approved creams or lotions to keep the skin moisturized. Protect your skin from cuts: Use gloves while cooking or gardening. Do not walk barefoot. If you shave the affected area, use an Copy. Do not wear tight clothes, shoes, or jewelry. Eat a healthy diet that includes a lot of fruits and vegetables. Activity Do exercises as told by your health care provider. Do not sit with your legs crossed. When possible, keep the affected limb raised (elevated) above the level of your heart. Avoid carrying things with an arm that is affected by lymphedema. General instructions Wear compression stockings or sleeves as told by your health care provider. Note any changes in size of the affected limb. You may be instructed to take regular measurements and keep track of them. Take over-the-counter and prescription medicines only as told by your health care provider. If you were prescribed an antibiotic medicine, take or apply it as told by your health care provider. Do not stop using the antibiotic even if you start to feel better or if your condition improves. Do not use heating pads or ice packs on the affected area. Avoid having blood draws, IV insertions, or blood pressure checks on the affected limb. Keep all follow-up visits. This is important. Contact a health care provider if you: Continue to have swelling in your limb. Have fluid leaking from the skin of your swollen limb. Have a cut that does not heal. Have redness or pain in the affected area. Develop purplish spots, rash, blisters, or sores (lesions) on your affected limb. Get help right away if you: Have new swelling in your limb that starts suddenly. Have shortness of breath or chest pain. Have a fever or chills. These symptoms may represent a serious problem that is an emergency. Do not wait to see if the symptoms will go away. Get medical help right away.  Call your local emergency services (911 in the [place] Do not drive yourself to the hospital. Summary Lymphedema is swelling that is caused by the abnormal collection of lymph in the tissues under the skin. Lymph is fluid from the tissues in your body that is removed through the lymphatic system. This system collects and carries excess fluid, fats, proteins, and wastes from the tissues of the body to the bloodstream. Lymphedema causes swelling, pain, and redness in the affected area. This may include the trunk area, or an arm or leg. Treatment for this condition may depend on the cause of your lymphedema. Treatment may include treating the underlying cause, complete decongestive therapy (CDT), compression methods, or surgery. This information is not intended to replace advice given to you by your health care provider. Make sure you discuss any questions you have with your health care provider. Document Revised: 03/30/2020 Document Reviewed: 03/30/2020 Elsevier Patient Education  2022 [employer].

## 2021-04-19 NOTE — Progress Notes (Signed)
Chief Complaint  Patient presents with   Follow-up   F/u  1. 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 Lymphedema was worse with norvasc use but since stopped per cards leg edema improved  For #1 and #2 f/u vascular pt will call for appt established will have office reach out to pt as well   3. Uncontrolled DM 2 A1C 9.8 12/07/20 hospital per endocrine visit 05/17/21 kc should be on nph 25 units am and 15 qhs and ssi 15 units 2-3 x per day ssi. Barriers not cooking food at home and having to order food out  Eye exam had 04/18/21 awaiting records   4. Anxiety improved taking ativan 0.5 qd prn    Review of Systems  Constitutional:  Negative for weight loss.  HENT:  Positive for hearing loss.   Eyes:  Negative for blurred vision.  Respiratory:  Negative for shortness of breath.   Cardiovascular:  Positive for leg swelling. Negative for chest pain.  Gastrointestinal:  Negative for constipation.  Musculoskeletal:  Positive for joint pain. Negative for falls.  Skin:  Negative for rash.  Neurological:  Negative for headaches.  Psychiatric/Behavioral:         Mood down at times  Past Medical History:  Diagnosis Date   Arthritis    Chickenpox    Diabetes mellitus without complication ([place])    one elevated reading/ no treatment   Diverticulitis    GI bleed    High cholesterol    History of blood transfusion    Hypertension    Renal insufficiency    Past Surgical History:  Procedure Laterality Date   APPENDECTOMY     CHOLECYSTECTOMY     ECTOPIC PREGNANCY SURGERY     EYE SURGERY     bilateral cataracts   EYE SURGERY     02/11/2019 repair hole in right eye    gallbladder      HYSTEROSCOPY WITH D & C N/A 10/26/2016    Procedure: DILATATION AND CURETTAGE /HYSTEROSCOPY;  Surgeon: Benjaman Kindler, MD;  Location: [facility];  Service: Gynecology;  Laterality: N/A;   HYSTEROSCOPY WITH D & C N/A 07/07/2018   Procedure: DILATATION AND CURETTAGE /HYSTEROSCOPY;  Surgeon: Benjaman Kindler, MD;  Location: [facility];  Service: Gynecology;  Laterality: N/A;   THYROIDECTOMY, PARTIAL     Family History  Problem Relation Age of Onset   Diabetes Mother    Hypertension Mother    Stroke Mother    Diabetes Other    Healthy Father    Diabetes Sister    Heart disease Sister    Social History   Socioeconomic History   Marital status: Widowed    Spouse name: Not on file   Number of children: 1   Years of education: 41   Highest education level: 12th grade  Occupational History   Occupation: retired    Comment: hx of Pharmacist, hospital, housekeeper, 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  Tobacco Use   Smoking status: Never   Smokeless tobacco: Never  Vaping Use   Vaping Use: Never used  Substance and Sexual Activity   Alcohol use: No   Drug use: No   Sexual activity: Not Currently  Other Topics Concern   Not on file  Social History Narrative   Lives alone    From [place]   Widowed - was married 3 times starting at age 74    hx of seamtress, 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   Social Determinants of Health   Financial Resource Strain: Low Risk    Difficulty of Paying Living Expenses: Not hard at all  Food Insecurity: No Food Insecurity   Worried About Charity fundraiser in the Last Year: Never true   Arboriculturist in the Last Year: Never true  Transportation Needs: No Transportation Needs   Lack of Transportation (Medical): No   Lack of Transportation (Non-Medical): No  Physical Activity: Inactive   Days of Exercise per Week: 0 days   Minutes of Exercise per Session: 0 min  Stress: Stress Concern Present   Feeling of Stress : To some  extent  Social Connections: Socially Isolated   Frequency of Communication with Friends and Family: More than three times a week   Frequency of Social Gatherings with Friends and Family: More than three times a week   Attends Religious Services: Never   Marine scientist or Organizations: No   Attends Archivist Meetings: Never   Marital Status: Widowed  Human resources officer Violence: Not At Risk   Fear of Current or Ex-Partner: No   Emotionally Abused: No   Physically Abused: No   Sexually Abused: No   Current Meds  Medication Sig   acetaminophen (TYLENOL) 500 MG tablet Take 500 mg by mouth every 6 (six) hours as needed for mild pain or headache.   ammonium lactate (AMLACTIN) 12 % lotion Apply 1 application topically 2 (two) times daily. Apply to feet once daily. Do not apply between toes.   Ascorbic Acid (VITAMIN C PO) Take 1 tablet by mouth daily.   aspirin EC 81 MG tablet Take 81 mg by mouth 2 (two) times a week. Swallow whole.   blood glucose meter kit and supplies KIT Accu chek, Dx code E11.65, check 3 times daily   glucose blood test strip Use as instructed tid  Accuchek Guide   hydrALAZINE (APRESOLINE) 50 MG tablet Take 1 tablet (50 mg total) by mouth 3 (three) times daily.   insulin NPH Human (NOVOLIN N) 100 UNIT/ML injection INJECT 20 UNITS IN THE MORNING AND THE EVENING WITH A MEAL   insulin regular (NOVOLIN R) 100 units/mL injection Inject 0-0.08 mLs (0-8 Units total) into the skin 3 (three) times daily before meals. (Patient taking differently: Inject 15 Units into the skin See admin instructions. Inject 15 units into the skin two to three times a day with food, PER SLIDING SCALE)   Insulin Syringe-Needle U-100 (B-D INS SYR ULTRAFINE .5CC/30G) 30G X 1/2" 0.5 ML MISC 1 Device by Does not apply route daily. Up to 5x per day with insulin bid (NPH) and tid (Regular)   Multiple Vitamins-Minerals (CENTRUM SILVER 50+WOMEN) TABS Take 1 tablet by mouth daily with breakfast.    mupirocin ointment (BACTROBAN) 2 % Apply 1 application topically 2 (two) times daily. Right index finger and left forearm   polyethylene glycol powder (GLYCOLAX/MIRALAX) 17 GM/SCOOP powder Take 17 g by mouth daily as needed for moderate constipation or severe constipation. Can take up to 2x per day prn. Mix with 8 ounces of liquid   spironolactone (ALDACTONE) 25 MG tablet Take 1 tablet (25 mg total) by mouth daily.   torsemide (  DEMADEX) 10 MG tablet Take 10 mg by mouth daily.   [DISCONTINUED] cloNIDine (CATAPRES) 0.2 MG tablet TAKE 1 TABLET BY MOUTH 3 TIMES DAILY.   [DISCONTINUED] rosuvastatin (CRESTOR) 20 MG tablet TAKE 1 TABLET BY MOUTH EVERY DAY   Allergies  Allergen Reactions   Celexa [Citalopram]     Diarrhea upset stomach    Jardiance [Empagliflozin] Other (See Comments)    Reaction not recalled   Norvasc [Amlodipine]     Leg edema   Tape Other (See Comments)    Leaves the skin "raw" if left on for a period of time- tolerates paper tape   Penicillin V Rash   Penicillins Rash    Has patient had a PCN reaction causing immediate rash, facial/tongue/throat swelling, SOB or lightheadedness with hypotension: Yes Has patient had a PCN reaction causing severe rash involving mucus membranes or skin necrosis: No Has patient had a PCN reaction that required hospitalization No Has patient had a PCN reaction occurring within the last 10 years: Yes If all of the above answers are "NO", then may proceed with Cephalosporin use.    No results found for this or any previous visit (from the past 2160 hour(s)). Objective  Body mass index is 45.49 kg/m. Wt Readings from Last 3 Encounters:  04/19/21 256 lb 12.8 oz (116.5 kg)  02/13/21 257 lb (116.6 kg)  12/29/20 264 lb (119.7 kg)   Temp Readings from Last 3 Encounters:  04/19/21 (!) 97.1 F (36.2 C) (Temporal)  12/29/20 98.6 F (37 C) (Oral)  12/16/20 98.7 F (37.1 C) (Oral)   BP Readings from Last 3 Encounters:  04/19/21 (!) 160/86   02/13/21 115/60  12/29/20 (!) 148/62   Pulse Readings from Last 3 Encounters:  04/19/21 86  02/13/21 72  12/29/20 85    Physical Exam Vitals and nursing note reviewed.  Constitutional:      Appearance: Normal appearance. She is well-developed. She is obese.  HENT:     Head: Normocephalic and atraumatic.  Eyes:     Conjunctiva/sclera: Conjunctivae normal.     Pupils: Pupils are equal, round, and reactive to light.  Cardiovascular:     Rate and Rhythm: Normal rate and regular rhythm.     Heart sounds: Normal heart sounds. No murmur heard. Pulmonary:     Effort: Pulmonary effort is normal.     Breath sounds: Normal breath sounds.  Skin:    General: Skin is warm and dry.  Neurological:     General: No focal deficit present.     Mental Status: She is alert and oriented to person, place, and time. Mental status is at baseline.     Comments: Uses rollator  Psychiatric:        Attention and Perception: Attention and perception normal.        Mood and Affect: Mood and affect normal.        Speech: Speech normal.        Behavior: Behavior normal. Behavior is cooperative.        Thought Content: Thought content normal.        Cognition and Memory: Cognition and memory normal.        Judgment: Judgment normal.    Assessment  Plan  Hypertension associated with diabetes ([place]) - Plan: Comprehensive metabolic panel, Lipid panel, CBC with Differential/Platelet, Hemoglobin A1c, Microalbumin / creatinine urine ratio, rosuvastatin (CRESTOR) 20 MG tablet F/u kc endocrine 05/24/21 on nph 25 am and 15 qhs and ssi up to 15 units  Eye exam 04/18/21  Anxiety - Plan: LORazepam (ATIVAN) 0.5 MG tablet qd prn Panic attack - Plan: LORazepam (ATIVAN) 0.5 MG tablet  Essential hypertension - Plan: cloNIDine (CATAPRES) 0.2 MG tablet tid prn, hydralazine 50 tid, torsemide 10 mg qd and spironlactone 25 mg qd  Hyperlipidemia, unspecified hyperlipidemia type - Plan: rosuvastatin (CRESTOR) 20 MG  tablet  Lymphedema Consider vascular pumps with vascular   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 Allen Norris in [place]  -09/10/14 diverticulosis sessile inflammatory polyp=inflammatory and EGD chronic active gastritis   -f/u prn does not want to do further unless needed    CT chest 12/15/20  IMPRESSION: 1. Negative for aortic dissection or aneurysm.   2. 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 Coronary artery and Aortic atherosclerosis (ICD10-I70.0).   3. No superimposed acute or inflammatory process identified in the chest, abdomen, or pelvis. Diverticulosis of the large bowel and duodenum without active inflammation. 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   CXR 01/20/18 pulm vascular congestion otherwise neg  cxr 06/22/19 with scarring    02/03/18 thyroid bx negative   Of note CT renal 07/16/16 c/w bulging disc and L4/5 spinal stenosis    [facility] saw 03/29/19 closed fx lateral malleolus right fibular broken ankle Carlynn Sp[facility] [place] urgent [facility]    Specialists Renal Dr. Wallace Keller Cards Dr. Randell Patient endocrine    Provider: Dr. Olivia Mackie McLean-Scocuzza-Internal Medicine

## 2021-04-20 LAB — LIPID PANEL
Cholesterol: 137 mg/dL (ref 0–200)
HDL: 50.6 mg/dL (ref >39.00)
NonHDL: 86.83
Total CHOL/HDL Ratio: 3
Triglycerides: 202 mg/dL — ABNORMAL HIGH (ref 0.0–149.0)
VLDL: 40.4 mg/dL — ABNORMAL HIGH (ref 0.0–40.0)

## 2021-04-20 LAB — CBC WITH DIFFERENTIAL/PLATELET
Basophils Absolute: 0.1 10*3/uL (ref 0.0–0.1)
Basophils Relative: 1.9 % (ref 0.0–3.0)
Eosinophils Absolute: 0.2 10*3/uL (ref 0.0–0.7)
Eosinophils Relative: 3.1 % (ref 0.0–5.0)
HCT: 41.4 % (ref 36.0–46.0)
Hemoglobin: 13.3 g/dL (ref 12.0–15.0)
Lymphocytes Relative: 56.7 % — ABNORMAL HIGH (ref 12.0–46.0)
Lymphs Abs: 4 10*3/uL (ref 0.7–4.0)
MCHC: 32.2 g/dL (ref 30.0–36.0)
MCV: 89.3 fl (ref 78.0–100.0)
Monocytes Absolute: 0.6 10*3/uL (ref 0.1–1.0)
Monocytes Relative: 8.2 % (ref 3.0–12.0)
Neutro Abs: 2.1 10*3/uL (ref 1.4–7.7)
Neutrophils Relative %: 30.1 % — ABNORMAL LOW (ref 43.0–77.0)
Platelets: 230 10*3/uL (ref 150.0–400.0)
RBC: 4.63 Mil/uL (ref 3.87–5.11)
RDW: 14 % (ref 11.5–15.5)
WBC: 7 10*3/uL (ref 4.0–10.5)

## 2021-04-20 LAB — HEMOGLOBIN A1C: Hgb A1c MFr Bld: 9.9 % — ABNORMAL HIGH (ref 4.6–6.5)

## 2021-04-20 LAB — COMPREHENSIVE METABOLIC PANEL
ALT: 21 U/L (ref 0–35)
AST: 23 U/L (ref 0–37)
Albumin: 4.3 g/dL (ref 3.5–5.2)
Alkaline Phosphatase: 47 U/L (ref 39–117)
BUN: 14 mg/dL (ref 6–23)
CO2: 33 mEq/L — ABNORMAL HIGH (ref 19–32)
Calcium: 10.6 mg/dL — ABNORMAL HIGH (ref 8.4–10.5)
Chloride: 98 mEq/L (ref 96–112)
Creatinine, Ser: 1.2 mg/dL (ref 0.40–1.20)
GFR: 42.23 mL/min — ABNORMAL LOW (ref >60.00)
Glucose, Bld: 171 mg/dL — ABNORMAL HIGH (ref 70–99)
Potassium: 4.3 mEq/L (ref 3.5–5.1)
Sodium: 138 mEq/L (ref 135–145)
Total Bilirubin: 1.2 mg/dL (ref 0.2–1.2)
Total Protein: 7.3 g/dL (ref 6.0–8.3)

## 2021-04-20 LAB — LDL CHOLESTEROL, DIRECT: Direct LDL: 50 mg/dL

## 2021-04-21 ENCOUNTER — Telehealth: Payer: Self-pay | Admitting: Internal Medicine

## 2021-04-21 NOTE — Telephone Encounter (Signed)
Patient called in for labs. Please see result note.

## 2021-04-21 NOTE — Telephone Encounter (Signed)
Pt call back from missing phone call. Advise Pt of note. Pt still would like call back.

## 2021-04-24 ENCOUNTER — Ambulatory Visit (INDEPENDENT_AMBULATORY_CARE_PROVIDER_SITE_OTHER): Payer: Medicare Other | Admitting: *Deleted

## 2021-04-24 DIAGNOSIS — E1129 Type 2 diabetes mellitus with other diabetic kidney complication: Secondary | ICD-10-CM

## 2021-04-24 DIAGNOSIS — I5032 Chronic diastolic (congestive) heart failure: Secondary | ICD-10-CM

## 2021-04-24 DIAGNOSIS — I10 Essential (primary) hypertension: Secondary | ICD-10-CM

## 2021-04-24 NOTE — Patient Instructions (Signed)
Visit Information  The patient verbalized understanding of instructions, educational materials, and care plan provided today and declined offer to receive copy of patient instructions, educational materials, and care plan.   The care management team will reach out to the patient again over the next 30 days.   [name] RN, MSN RN Care Management Coordinator [place] [phone] [email or web address]

## 2021-04-24 NOTE — Chronic Care Management (AMB) (Signed)
Chronic Care Management   CCM RN Visit Note  04/24/2021 Name: Alexis Farmer MRN: [id number] DOB: 03-05-1941  Subjective: Alexis Farmer is a 80 y.o. year old female who is a primary care patient of McLean-Scocuzza, Nino Glow, MD. The care management team was consulted for assistance with disease management and care coordination needs.    Engaged with patient by telephone for follow up visit in response to provider referral for case management and/or care coordination services.   Consent to Services:  The patient was given information about Chronic Care Management services, agreed to services, and gave verbal consent prior to initiation of services.  Please see initial visit note for detailed documentation.   Patient agreed to services and verbal consent obtained.   Assessment: Review of patient past medical history, allergies, medications, health status, including review of consultants reports, laboratory and other test data, was performed as part of comprehensive evaluation and provision of chronic care management services.   SDOH (Social Determinants of Health) assessments and interventions performed:    CCM Care Plan  Allergies  Allergen Reactions   Celexa [Citalopram]     Diarrhea upset stomach    Jardiance [Empagliflozin] Other (See Comments)    Reaction not recalled   Norvasc [Amlodipine]     Leg edema   Tape Other (See Comments)    Leaves the skin "raw" if left on for a period of time- tolerates paper tape   Penicillin V Rash   Penicillins Rash    Has patient had a PCN reaction causing immediate rash, facial/tongue/throat swelling, SOB or lightheadedness with hypotension: Yes Has patient had a PCN reaction causing severe rash involving mucus membranes or skin necrosis: No Has patient had a PCN reaction that required hospitalization No Has patient had a PCN reaction occurring within the last 10 years: Yes If all of the above answers are "NO", then may proceed with  Cephalosporin use.     Outpatient Encounter Medications as of 04/24/2021  Medication Sig   acetaminophen (TYLENOL) 500 MG tablet Take 500 mg by mouth every 6 (six) hours as needed for mild pain or headache.   ammonium lactate (AMLACTIN) 12 % lotion Apply 1 application topically 2 (two) times daily. Apply to feet once daily. Do not apply between toes.   Ascorbic Acid (VITAMIN C PO) Take 1 tablet by mouth daily.   aspirin EC 81 MG tablet Take 81 mg by mouth 2 (two) times a week. Swallow whole.   blood glucose meter kit and supplies KIT Accu chek, Dx code E11.65, check 3 times daily   cloNIDine (CATAPRES) 0.2 MG tablet Take 1 tablet (0.2 mg total) by mouth 3 (three) times daily.   glucose blood test strip Use as instructed tid  Accuchek Guide   hydrALAZINE (APRESOLINE) 50 MG tablet Take 1 tablet (50 mg total) by mouth 3 (three) times daily.   insulin NPH Human (NOVOLIN N) 100 UNIT/ML injection INJECT 20 UNITS IN THE MORNING AND THE EVENING WITH A MEAL   insulin regular (NOVOLIN R) 100 units/mL injection Inject 0-0.08 mLs (0-8 Units total) into the skin 3 (three) times daily before meals. (Patient taking differently: Inject 15 Units into the skin See admin instructions. Inject 15 units into the skin two to three times a day with food, PER SLIDING SCALE)   Insulin Syringe-Needle U-100 (B-D INS SYR ULTRAFINE .5CC/30G) 30G X 1/2" 0.5 ML MISC 1 Device by Does not apply route daily. Up to 5x per day with insulin bid (NPH) and tid (  Regular)   LORazepam (ATIVAN) 0.5 MG tablet Take 1 tablet (0.5 mg total) by mouth daily as needed for anxiety. D/c 0.25   meclizine (ANTIVERT) 12.5 MG tablet Take 1 tablet (12.5 mg total) by mouth daily as needed for dizziness. (Patient not taking: Reported on 04/19/2021)   Multiple Vitamins-Minerals (CENTRUM SILVER 50+WOMEN) TABS Take 1 tablet by mouth daily with breakfast.   mupirocin ointment (BACTROBAN) 2 % Apply 1 application topically 2 (two) times daily. Right index finger  and left forearm   polyethylene glycol powder (GLYCOLAX/MIRALAX) 17 GM/SCOOP powder Take 17 g by mouth daily as needed for moderate constipation or severe constipation. Can take up to 2x per day prn. Mix with 8 ounces of liquid   rosuvastatin (CRESTOR) 20 MG tablet TAKE 1 TABLET BY MOUTH EVERY DAY   spironolactone (ALDACTONE) 25 MG tablet Take 1 tablet (25 mg total) by mouth daily.   torsemide (DEMADEX) 10 MG tablet Take 10 mg by mouth daily.   No facility-administered encounter medications on file as of 04/24/2021.    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) 05/30/2020   Retinopathy 05/30/2020   Primary hypertension 03/25/2020   Multinodular goiter 02/04/2020   Peripheral edema 12/22/2019   Proteinuria 12/22/2019   Constipation 11/17/2019   Recurrent falls 08/13/2019   Physical deconditioning 08/13/2019   Abdominal aortic atherosclerosis (HCC) 07/24/2019   Morbid obesity with BMI of 45.0-49.9, adult (HCC) 06/09/2019   Hypercalcemia 03/18/2019   Round hole, unspecified eye 02/06/2019   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   Disorder of rotator cuff 06/24/2018   Abnormal gait 03/26/2018   Fall 03/26/2018   Arthritis 03/26/2018   Thyroid nodule 01/20/2018   Dysuria 03/22/2017   Cervical spondylosis with radiculopathy 01/26/2017   Rectal  hemorrhage 07/29/2016   Carotid artery stenosis 07/19/2016   PAD (peripheral artery disease) (HCC) 07/19/2016   Swelling of lower leg 07/19/2016   Bradycardia 07/17/2016   Postmenopausal bleeding 07/17/2016   Chronic diastolic CHF (congestive heart failure) (HCC) 05/31/2016   Cough 01/25/2016   Chronic fatigue 11/15/2015   Depression, recurrent (HCC) 10/13/2015   Osteoarthritis 10/13/2015   Vertigo 10/13/2015   Anxiety 09/13/2015   Gastrointestinal hemorrhage 08/03/2015   Hyperglycemia due to type 2 diabetes mellitus (HCC) 03/11/2015   Mixed hyperlipidemia 08/26/2014   Essential hypertension 04/08/2014   Diabetes mellitus type II, uncontrolled 04/08/2014   Chronic kidney disease, stage 2 (mild) 04/08/2014   Edema of foot 04/08/2014   Morbid obesity (HCC) 04/08/2014   Low back pain 04/08/2014   Diabetic polyneuropathy (HCC) 04/08/2014   Type 2 diabetes mellitus with other diabetic kidney complication (HCC) 04/08/2014    Conditions to be addressed/monitored:CHF, HTN, and DMII  Care Plan : Diabetes Type 2 (Adult)  Updates made by ,  D, RN since 04/24/2021 12:00 AM     Problem: Glycemic Management (Diabetes, Type 2)   Priority: Medium       Long-Range Goal: Patient will verbalize decrease in Hgb A1C by 0.3 points within the next 90 days   Start Date: 09/28/2020  Expected End Date: 08/15/2021  This Visit's Progress: Not on track  Recent Progress: On track  Priority: Medium  Note:   Objective:  Lab Results  Component Value Date   HGBA1C 9.9 (H) 04/19/2021  Current Barriers:  Knowledge Deficits related to basic Diabetes pathophysiology and self care/management as evidenced by continued elevated blood sugars and Hgb A1C increased to 9.9.   Fasting blood sugar this morning was 184 (ranges vary as she tries to minimize lows).  Acknowledges she does not eat correct at times to manage her diabetes and hypoglycemia at nighttime.  Reports being compliant with all her  medications and insulins.   Lack of self motivation to self management of diabetes Case Manager Clinical Goal(s):  patient will demonstrate improved adherence to prescribed treatment plan for diabetes self care/management as evidenced by: at least 4 times a day monitoring and recording of CBG,  adherence to ADA/ carb modified diet, adherence to prescribed medication regimen, contacting provider for new or worsened symptoms or questions Interventions:  Collaboration with McLean-Scocuzza, Nino Glow, MD regarding development and update of comprehensive plan of care as evidenced by provider attestation and co-signature Inter-disciplinary care team collaboration (see longitudinal plan of care) Provided education to patient about basic DM disease process Reviewed medications with patient and discussed importance of medication adherence Discussed plans with patient for ongoing care management follow up and provided patient with direct contact information for care management team Provided patient with verbal educational materials related to hypo and hyperglycemia and importance of correct treatment Reviewed scheduled/upcoming provider appointments including:  eye exam on 11/15/20 Advised patient, providing education and rationale, to check cbg at least 4 times a day and record, calling primary care provider or endocrinologist after consultation for findings outside established parameters.   Sent Diabetes Education and encouraged patient to review information Discussed diabetic carbohydrate modified diet and foods to limit or avoid; Provided medical nutrition therapy and development of individualized eating Discussed current Hgb A1C and what is normal and what is patient's goal; per patient her goal is below 10-discussed and encouraged patient to review goal with provider and to consider changing goal to lower Promoted self-monitoring of blood glucose levels.  Assessed and addressed barriers to management plan,  such as food insecurity, age, developmental ability, depression, anxiety, fear of hypoglycemia or weight gain, as well as medication cost, side effects and complicated regimen Encouraged to keep and attend scheduled medical appointments Discussed anxiety; encouraged to use diversional activities to help with anxiety;  Emotional support and empathy provided to patient Encouraged to spend more quality time with family Discussed diabetic shoes and foot care Reviewed current blood sugar readings and discussed hypoglycemia prevention and ways to help achieve better glycemic control; discussed how to treat hypoglycemia to prevent rebound hyperglycemia Patient Goals/Self-Care Activities: Check blood sugar at least 4 times a day Check blood sugar if I feel it is too high or too low Enter blood sugar readings and medication/insulin into daily log Take the blood sugar log to all doctor visits  Discuss goal A1C with endocrinology  Avoid or limit Carbs/Sweets/Sugars Take insulins as prescribed (25 Units in am and 15 Units in pm) Follow Up Plan: The care management team will reach out to the patient again over the next 30 business days.       Care Plan : Heart Failure (Adult)  Updates made  by Leona Singleton, RN since 04/24/2021 12:00 AM     Problem: Symptom Exacerbation (Heart Failure)   Priority: Medium     Long-Range Goal: Patient will report obtaining scale for home within the next 90 days   Start Date: 09/28/2020  Expected End Date: 09/15/2021  This Visit's Progress: Not on track  Recent Progress: Not on track  Priority: Medium  Note:   Current Barriers:  Knowledge deficit related to basic heart failure pathophysiology and self care management as evidenced by not knowing heart failure diagnosis and treatment/self care management.  Reports now not having scale to weigh at home; last weight at MD office was down to 256 pounds.  Does report compliance with fluid medications.  Denies any  current lower extremity edema.  Has been checking blood pressures, right arm pressures 164/69 while left arm 117/54.  Has upcoming vascular consultation Knowledge Deficits related to heart failure medications Case Manager Clinical Goal(s):  patient will obtain scale to weigh self daily and record patient will verbalize understanding of Heart Failure Action Plan and when to call doctor patient will take all Heart Failure mediations as prescribed patient will weigh daily and record (notifying MD of 3 lb weight gain over night or 5 lb in a week) Interventions:  Collaboration with McLean-Scocuzza, Nino Glow, MD regarding development and update of comprehensive plan of care as evidenced by provider attestation and co-signature Inter-disciplinary care team collaboration (see longitudinal plan of care) Basic overview and discussion of pathophysiology of Heart Failure reviewed  Provided verbal education on low sodium diet Assessed need for readable accurate scales in home and encouraged patient to obtain scale with large print or verbally read out weight to patient; stressed the importance of daily weight monitoring and need for scale in the home Provided education about placing scale on hard, flat surface Advised patient to weigh each morning after emptying bladder Discussed importance of daily weight and advised patient to weigh and record daily Reviewed role of diuretics in prevention of fluid overload and management of heart failure Barriers to lifestyle changes reviewed and addressed; barriers to treatment reviewed and addressed Healthy lifestyle promoted Self-awareness of signs/symptoms of worsening disease encouraged Assessed understanding of adherence and barriers to treatment plan, as well as lifestyle changes; discussed strategies to address barriers Provided patient ear for venting outlet; emotional support and empathy provided to patient  Patient Goals/Self-Care Activities: Call office if I  gain more than 2 pounds in one day or 5 pounds in one week Obtain home scale Use salt in moderation Watch for swelling in feet, ankles and legs every day; elevate legs when sitting Weigh myself daily and write weights in log Take log to all provider appointments Take BP in right arm, recording reading for provider review Follow Up Plan: The care management team will reach out to the patient again over the next 30 business days.        Plan:The care management team will reach out to the patient again over the next 30 days. [name] RN, MSN RN Care Management Coordinator [place] [phone] [email or web address]

## 2021-05-01 ENCOUNTER — Encounter: Payer: Self-pay | Admitting: Internal Medicine

## 2021-05-17 DIAGNOSIS — E1129 Type 2 diabetes mellitus with other diabetic kidney complication: Secondary | ICD-10-CM

## 2021-05-17 DIAGNOSIS — I110 Hypertensive heart disease with heart failure: Secondary | ICD-10-CM

## 2021-05-17 DIAGNOSIS — I5032 Chronic diastolic (congestive) heart failure: Secondary | ICD-10-CM | POA: Diagnosis not present

## 2021-05-17 DIAGNOSIS — Z794 Long term (current) use of insulin: Secondary | ICD-10-CM

## 2021-05-19 ENCOUNTER — Ambulatory Visit (INDEPENDENT_AMBULATORY_CARE_PROVIDER_SITE_OTHER): Payer: Medicare Other | Admitting: *Deleted

## 2021-05-19 DIAGNOSIS — E1129 Type 2 diabetes mellitus with other diabetic kidney complication: Secondary | ICD-10-CM

## 2021-05-19 DIAGNOSIS — I5032 Chronic diastolic (congestive) heart failure: Secondary | ICD-10-CM

## 2021-05-19 DIAGNOSIS — I152 Hypertension secondary to endocrine disorders: Secondary | ICD-10-CM

## 2021-05-19 NOTE — Patient Instructions (Addendum)
Visit Information  Thank you for taking time to visit with me today. Please don't hesitate to contact me if I can be of assistance to you before our next scheduled telephone appointment.  Following are the goals we discussed today:   Check blood sugar at least 4 times a day  Check blood sugar if I feel it is too high or too low  Enter blood sugar readings and medication/insulin into daily log  Take the blood sugar log to all doctor visits   Discuss goal A1C with endocrinology   Avoid or limit Carbs/Sweets/Sugars  Take insulins as prescribed (25 Units in am and 15 Units in pm)  Call office if I gain more than 2 pounds in one day or 5 pounds in one week  Obtain home scale  Use salt in moderation  Watch for swelling in feet, ankles and legs every day; elevate legs when sitting  Weigh myself daily and write weights in log  Take log to all provider appointments  Take BP in right arm, recording reading for provider review   Our next appointment is by telephone on 06/14/21 at 1215  Please call the care guide team at [phone] if you need to cancel or reschedule your appointment.   If you are experiencing a Mental Health or [place] or need someone to talk to, please call the Suicide and Crisis Lifeline: 988 call the [place] National Suicide Prevention Lifeline: 747-376-7460 or TTY: 406-855-6597 TTY 404-498-1161) to talk to a trained counselor call 1-800-273-TALK (toll free, 24 hour hotline) go to [facility] [place] [phone]) call 911   The patient verbalized understanding of instructions, educational materials, and care plan provided today and declined offer to receive copy of patient instructions, educational materials, and care plan.   [name] RN, MSN RN Care Management Coordinator [place] [phone] [email or web address]

## 2021-05-21 NOTE — Progress Notes (Signed)
Cardiology Office Note  Date:  05/22/2021   ID:  Redell Bhandari, DOB [birth year], MRN [id number]  PCP:  McLean-Scocuzza, Nino Glow, MD   Chief Complaint  Patient presents with   3 month follow up     Patient c/o LE edema. Medications reviewed by the patient verbally.     HPI:  Ms. Barrero is a 80 y.o. woman with history of  obesity,  insulin-dependent diabetes, Hemoglobin A1c >9 poor diet, hypertension,  hyperlipidemia,  moderate LVH  hospital 08/13/2014  chest pain, malignant hypertension, possible sleep apnea,  chronic kidney disease,  Anxiety Notes indicate previous history of GI bleeding  colonoscopy which was normal by her report She presents to for follow-up of her hypertension, chronic diastolic CHF, poorly controlled diabetes, CKD  In follow-up today, concerned about low blood pressure right arm, high blood pressure left arm Numbers reviewed, typically systolic pressure 707 up to 170 on the left arm Right arm typically 867 up to 544 systolic No pain in the right arm  Followed by vascular in [facility]  Carotid u/s reviewed on today's visit Normal flow hemodynamics were seen in the left subclavian  artery. The right subclavian artery has dampened monophasic flow,  retrograde vertebral artery flow, as well as difference in  brachial  artery pressuress suggestive of right subclavian steal   Reports she is working on her diet and endocrine to get A1c down  labs reviewed A1C 9.9,  Total chol 137, LDL 50 GFR 42  Leg swelling improved without amlodipine, still with leg swelling Does not wear compression hose  EKG personally reviewed by myself on todays visit Normal sinus rhythm rate 88 bpm T wave abnormality 1 and aVL, left axis deviation  Other past medical history reviewed hospital end of June 2022 Symptoms of anxiety.  Patient reports that worsening tinnitus, lightheadedness and flushed.  She also report urinary frequency, urgency lower extremity edema, mild  nausea.  Troponin of 25 felt secondary to elevated blood pressure  echocardiogram reviewed from June 2022   1. Left ventricular ejection fraction, by estimation, is 60 to 65%. The  left ventricle has normal function. The left ventricle has no regional  wall motion abnormalities. There is moderate concentric left ventricular  hypertrophy. Indeterminate diastolic  filling due to E-A fusion.   2. Right ventricular systolic function is normal. The right ventricular  size is normal. Tricuspid regurgitation signal is inadequate for assessing  PA pressure.   CT chest  Calcified coronary artery and aortic atherosclerosis. 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 Negative for thoracic aortic aneurysm or dissection. Calcified atherosclerosis involving the other proximal great vessels which appear patent   10/2016  D&C, hysteroscopy.  Postmenopausal bleeding.   Previous Echocardiogram 08/13/2014 showed normal ejection fraction, normal RV size and function, moderate LVH   Renal artery duplex showing no blockage of the mid to distal vessels bilaterally, challenging study    PMH:   has a past medical history of Arthritis, Chickenpox, Diabetes mellitus without complication ([place]), Diverticulitis, GI bleed, High cholesterol, History of blood transfusion, Hypertension, and Renal insufficiency.  PSH:    Past Surgical History:  Procedure Laterality Date   APPENDECTOMY     CHOLECYSTECTOMY     ECTOPIC PREGNANCY SURGERY     EYE SURGERY     bilateral cataracts   EYE SURGERY     02/11/2019 repair hole in right eye    gallbladder  HYSTEROSCOPY WITH D & C N/A 10/26/2016   Procedure: DILATATION AND CURETTAGE /HYSTEROSCOPY;  Surgeon: Benjaman Kindler, MD;  Location: [facility];  Service: Gynecology;  Laterality: N/A;   HYSTEROSCOPY WITH D & C N/A 07/07/2018   Procedure: DILATATION AND CURETTAGE  /HYSTEROSCOPY;  Surgeon: Benjaman Kindler, MD;  Location: [facility];  Service: Gynecology;  Laterality: N/A;   THYROIDECTOMY, PARTIAL      Current Outpatient Medications  Medication Sig Dispense Refill   acetaminophen (TYLENOL) 500 MG tablet Take 500 mg by mouth every 6 (six) hours as needed for mild pain or headache.     ammonium lactate (AMLACTIN) 12 % lotion Apply 1 application topically 2 (two) times daily. Apply to feet once daily. Do not apply between toes. 400 g 4   Ascorbic Acid (VITAMIN C PO) Take 1 tablet by mouth daily.     aspirin EC 81 MG tablet Take 81 mg by mouth 2 (two) times a week. Swallow whole.     blood glucose meter kit and supplies KIT Accu chek, Dx code E11.65, check 3 times daily 1 each 0   cloNIDine (CATAPRES) 0.2 MG tablet Take 1 tablet (0.2 mg total) by mouth 3 (three) times daily. 270 tablet 3   glucose blood test strip Use as instructed tid  Accuchek Guide 300 each 12   hydrALAZINE (APRESOLINE) 50 MG tablet Take 1 tablet (50 mg total) by mouth 3 (three) times daily. 270 tablet 3   insulin NPH Human (NOVOLIN N) 100 UNIT/ML injection INJECT 20 UNITS IN THE MORNING AND THE EVENING WITH A MEAL 40 mL PRN   insulin regular (NOVOLIN R) 100 units/mL injection Inject 0-0.08 mLs (0-8 Units total) into the skin 3 (three) times daily before meals. (Patient taking differently: Inject 15 Units into the skin See admin instructions. Inject 15 units into the skin two to three times a day with food, PER SLIDING SCALE) 30 mL 11   Insulin Syringe-Needle U-100 (B-D INS SYR ULTRAFINE .5CC/30G) 30G X 1/2" 0.5 ML MISC 1 Device by Does not apply route daily. Up to 5x per day with insulin bid (NPH) and tid (Regular) 500 each 12   LORazepam (ATIVAN) 0.5 MG tablet Take 1 tablet (0.5 mg total) by mouth daily as needed for anxiety. D/c 0.25 30 tablet 5   Multiple Vitamins-Minerals (CENTRUM SILVER 50+WOMEN) TABS Take 1 tablet by mouth daily with breakfast.     mupirocin ointment (BACTROBAN) 2 %  Apply 1 application topically 2 (two) times daily. Right index finger and left forearm 30 g 2   polyethylene glycol powder (GLYCOLAX/MIRALAX) 17 GM/SCOOP powder Take 17 g by mouth daily as needed for moderate constipation or severe constipation. Can take up to 2x per day prn. Mix with 8 ounces of liquid 3350 g 11   rosuvastatin (CRESTOR) 20 MG tablet TAKE 1 TABLET BY MOUTH EVERY DAY 90 tablet 3   spironolactone (ALDACTONE) 25 MG tablet Take 1 tablet (25 mg total) by mouth daily. 90 tablet 3   torsemide (DEMADEX) 10 MG tablet Take 10 mg by mouth daily.     meclizine (ANTIVERT) 12.5 MG tablet Take 1 tablet (12.5 mg total) by mouth daily as needed for dizziness. (Patient not taking: Reported on 05/22/2021) 90 tablet 2   No current facility-administered medications for this visit.    Allergies:   Celexa [citalopram], Jardiance [empagliflozin], Norvasc [amlodipine], Tape, Penicillin v, and Penicillins   Social History:  The patient  reports that she has never smoked. She has never  used smokeless tobacco. She reports that she does not drink alcohol and does not use drugs.   Family History:   family history includes Diabetes in her mother, sister, and another family member; Healthy in her father; Heart disease in her sister; Hypertension in her mother; Stroke in her mother.    Review of Systems: Review of Systems  Constitutional: Negative.   Eyes: Negative.   Respiratory: Negative.    Cardiovascular:  Positive for leg swelling.  Gastrointestinal: Negative.   Genitourinary: Negative.   Musculoskeletal: Negative.   Neurological: Negative.   Psychiatric/Behavioral: Negative.    All other systems reviewed and are negative.   PHYSICAL EXAM: VS:  BP (!) 170/70 (BP Location: Left Arm, Patient Position: Sitting, Cuff Size: Normal)   Pulse 88   Ht '5\' 3"'  (1.6 m)   Wt 252 lb 4 oz (114.4 kg)   SpO2 98%   BMI 44.68 kg/m  , BMI Body mass index is 44.68 kg/m.  Constitutional:  oriented to person,  place, and time. No distress.  Obese HENT:  Head: Grossly normal Eyes:  no discharge. No scleral icterus.  Neck: No JVD, no carotid bruits  Cardiovascular: Regular rate and rhythm, no murmurs appreciated Trace pitting LE edema Pulmonary/Chest: Clear to auscultation bilaterally, no wheezes or rails Abdominal: Soft.  no distension.  no tenderness.  Musculoskeletal: Normal range of motion Neurological:  normal muscle tone. Coordination normal. No atrophy Skin: Skin warm and dry Psychiatric: normal affect, pleasant   Recent Labs: 12/15/2020: B Natriuretic Peptide 34.0; Magnesium 1.9; TSH 3.367 04/19/2021: ALT 21; BUN 14; Creatinine, Ser 1.20; Hemoglobin 13.3; Platelets 230.0; Potassium 4.3; Sodium 138    Lipid Panel Lab Results  Component Value Date   CHOL 137 04/19/2021   HDL 50.60 04/19/2021   LDLCALC 40 12/15/2020   TRIG 202.0 (H) 04/19/2021      Wt Readings from Last 3 Encounters:  05/22/21 252 lb 4 oz (114.4 kg)  04/19/21 256 lb 12.8 oz (116.5 kg)  02/13/21 257 lb (116.6 kg)      ASSESSMENT AND PLAN:  Essential hypertension, benign Continue to hold amlodipine given improvement in her leg swelling without calcium channel blocker Blood pressure continues to run very high left arm, she is concerned about any medication changes and how it would affect the blood flow on the right arm -Reassurance provided -Stressed importance of getting her systolic pressure down which is typically 1 60-1 70 -We recommended she go up on her hydralazine to 100 mg 3 times daily, will likely need 100 mg 4 times daily She is indicated she would like to move slowly, out of fear of making pressure on the right arm too low, she will try the 75 mg first before titrating upwards  Venous insufficiency Recommended compression hose, leg elevation, Ace wraps if needed Avoid calcium channel blockers  Mixed hyperlipidemia On crestor 10 Cholesterol at goal  Uncontrolled type 2 diabetes mellitus with  stage 3 chronic kidney disease, with long-term current use of insulin (HCC) Uncontrolled, A1C around 10 Long history of poorly controlled, needs to work with endocrine Stressed importance of continued diet  CKD (chronic kidney disease) stage 3, GFR 30-59 ml/min Plan: Sees Dr. Candiss Norse On torsemide 10 sdaily  Morbid obesity ([place]) Unable to exercise. 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  I, Ida Rogue, M.D. Ph.D., have reviewed the above documentation for accuracy and completeness, and I agree with the above.    Signed, Esmond Plants, M.D., Ph.D. 05/22/2021  [place], [place]

## 2021-05-21 NOTE — Chronic Care Management (AMB) (Signed)
Chronic Care Management   CCM RN Visit Note  05/21/2021 Name: Alexis Farmer MRN: [id number] DOB: 20-Apr-1941  Subjective: Alexis Farmer is a 80 y.o. year old female who is a primary care patient of McLean-Scocuzza, Nino Glow, MD. The care management team was consulted for assistance with disease management and care coordination needs.    Engaged with patient by telephone for follow up visit in response to provider referral for case management and/or care coordination services.   Consent to Services:  The patient was given information about Chronic Care Management services, agreed to services, and gave verbal consent prior to initiation of services.  Please see initial visit note for detailed documentation.   Patient agreed to services and verbal consent obtained.   Assessment: Review of patient past medical history, allergies, medications, health status, including review of consultants reports, laboratory and other test data, was performed as part of comprehensive evaluation and provision of chronic care management services.   SDOH (Social Determinants of Health) assessments and interventions performed:    CCM Care Plan  Allergies  Allergen Reactions   Celexa [Citalopram]     Diarrhea upset stomach    Jardiance [Empagliflozin] Other (See Comments)    Reaction not recalled   Norvasc [Amlodipine]     Leg edema   Tape Other (See Comments)    Leaves the skin "raw" if left on for a period of time- tolerates paper tape   Penicillin V Rash   Penicillins Rash    Has patient had a PCN reaction causing immediate rash, facial/tongue/throat swelling, SOB or lightheadedness with hypotension: Yes Has patient had a PCN reaction causing severe rash involving mucus membranes or skin necrosis: No Has patient had a PCN reaction that required hospitalization No Has patient had a PCN reaction occurring within the last 10 years: Yes If all of the above answers are "NO", then may proceed with  Cephalosporin use.     Outpatient Encounter Medications as of 05/19/2021  Medication Sig   acetaminophen (TYLENOL) 500 MG tablet Take 500 mg by mouth every 6 (six) hours as needed for mild pain or headache.   ammonium lactate (AMLACTIN) 12 % lotion Apply 1 application topically 2 (two) times daily. Apply to feet once daily. Do not apply between toes.   Ascorbic Acid (VITAMIN C PO) Take 1 tablet by mouth daily.   aspirin EC 81 MG tablet Take 81 mg by mouth 2 (two) times a week. Swallow whole.   blood glucose meter kit and supplies KIT Accu chek, Dx code E11.65, check 3 times daily   cloNIDine (CATAPRES) 0.2 MG tablet Take 1 tablet (0.2 mg total) by mouth 3 (three) times daily.   glucose blood test strip Use as instructed tid  [place]uc[place] Guide   hydrALAZINE (APRESOLINE) 50 MG tablet Take 1 tablet (50 mg total) by mouth 3 (three) times daily.   insulin NPH Human (NOVOLIN N) 100 UNIT/ML injection INJECT 20 UNITS IN THE MORNING AND THE EVENING WITH A MEAL   insulin regular (NOVOLIN R) 100 units/mL injection Inject 0-0.08 mLs (0-8 Units total) into the skin 3 (three) times daily before meals. (Patient taking differently: Inject 15 Units into the skin See admin instructions. Inject 15 units into the skin two to three times a day with food, PER SLIDING SCALE)   Insulin Syringe-Needle U-100 (B-D INS SYR ULTRAFINE .5CC/30G) 30G X 1/2" 0.5 ML MISC 1 Device by Does not apply route daily. Up to 5x per day with insulin bid (NPH) and tid (  Regular)   LORazepam (ATIVAN) 0.5 MG tablet Take 1 tablet (0.5 mg total) by mouth daily as needed for anxiety. D/c 0.25   meclizine (ANTIVERT) 12.5 MG tablet Take 1 tablet (12.5 mg total) by mouth daily as needed for dizziness. (Patient not taking: Reported on 04/19/2021)   Multiple Vitamins-Minerals (CENTRUM SILVER 50+WOMEN) TABS Take 1 tablet by mouth daily with breakfast.   mupirocin ointment (BACTROBAN) 2 % Apply 1 application topically 2 (two) times daily. Right index finger  and left forearm   polyethylene glycol powder (GLYCOLAX/MIRALAX) 17 GM/SCOOP powder Take 17 g by mouth daily as needed for moderate constipation or severe constipation. Can take up to 2x per day prn. Mix with 8 ounces of liquid   rosuvastatin (CRESTOR) 20 MG tablet TAKE 1 TABLET BY MOUTH EVERY DAY   spironolactone (ALDACTONE) 25 MG tablet Take 1 tablet (25 mg total) by mouth daily.   torsemide (DEMADEX) 10 MG tablet Take 10 mg by mouth daily.   No facility-administered encounter medications on file as of 05/19/2021.    Patient Active Problem List   Diagnosis Date Noted   Lymphedema 04/19/2021   Atherosclerosis of aorta ([place]) 01/16/2021   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/16/2021   Spinal stenosis of lumbar region 01/16/2021   Stage 3a chronic kidney disease ([place]) 01/15/2021   Atypical chest pain 01/15/2021   Panic attack 01/15/2021   Hypertensive urgency 12/15/2020   Palpitations 12/15/2020   Elevated troponin 12/15/2020   Other chronic pain 08/25/2020   Hypertension associated with diabetes ([place]) 08/25/2020   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   Pure hypercholesterolemia 08/10/2020   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 ([place]) 05/30/2020   Retinopathy 05/30/2020   Primary hypertension 03/25/2020   Multinodular goiter 02/04/2020   Peripheral edema 12/22/2019   Proteinuria 12/22/2019   Constipation 11/17/2019   Recurrent falls 08/13/2019   Physical deconditioning 08/13/2019   Abdominal aortic atherosclerosis ([place]) 07/24/2019   Morbid obesity with BMI of 45.0-49.9, adult ([place]) 06/09/2019   Hypercalcemia 03/18/2019   Round hole, unspecified eye 02/06/2019   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   Disorder of rotator cuff 06/24/2018   Abnormal gait 03/26/2018   Fall 03/26/2018   Arthritis 03/26/2018   Thyroid nodule 01/20/2018   Dysuria 03/22/2017   Cervical spondylosis with radiculopathy 01/26/2017   Rectal  hemorrhage 07/29/2016   Carotid artery stenosis 07/19/2016   PAD (peripheral artery disease) ([place]) 07/19/2016   Swelling of lower leg 07/19/2016   Bradycardia 07/17/2016   Postmenopausal bleeding 07/17/2016   Chronic diastolic CHF (congestive heart failure) ([place]) 05/31/2016   Cough 01/25/2016   Chronic fatigue 11/15/2015   Depression, recurrent ([place]) 10/13/2015   Osteoarthritis 10/13/2015   Vertigo 10/13/2015   Anxiety 09/13/2015   Gastrointestinal hemorrhage 08/03/2015   Hyperglycemia due to type 2 diabetes mellitus ([place]) 03/11/2015   Mixed hyperlipidemia 08/26/2014   Essential hypertension 04/08/2014   Diabetes mellitus type II, uncontrolled 04/08/2014   Chronic kidney disease, stage 2 (mild) 04/08/2014   Edema of foot 04/08/2014   Morbid obesity ([place]) 04/08/2014   Low back pain 04/08/2014   Diabetic polyneuropathy ([place]) 04/08/2014   Type 2 diabetes mellitus with other diabetic kidney complication ([place]) 17/71/1657    Conditions to be addressed/monitored:CHF, HTN, and DMII  Care Plan : RNCM  Diabetes Type 2 & Heart Failure (Adult)  Updates made by Leona Singleton, RN since 05/21/2021 12:00 AM     Problem: Glycemic & Heart Failure Management (Diabetes,  Type 2)   Priority: Medium     Long-Range Goal: Patient will verbalize decrease in Hgb A1C by 0.3 points within the next 90 days   Start Date: 09/28/2020  Expected End Date: 08/15/2021  This Visit's Progress: Not on track  Recent Progress: Not on track  Priority: Medium  Note:   Objective:  Lab Results  Component Value Date   HGBA1C 9.9 (H) 04/19/2021  Current Barriers:  Knowledge Deficits related to basic Diabetes pathophysiology and self care/management as evidenced by continued elevated blood sugars and Hgb A1C increased to 9.9.   Fasting blood sugar this morning was 200 (ranges vary as she tries to minimize lows).  Acknowledges she does not eat correct but recognizes her food choices and strives to make changes and do  better.  Reports being compliant with all her medications and insulins.     Lack of self motivation to self management of diabetes Knowledge deficit related to basic heart failure pathophysiology and self care management as evidenced by not knowing heart failure diagnosis and treatment/self care management.  Reports now not having scale to weigh at home; last weight at MD office was down to 236 pounds.  Does report compliance with fluid medications.  Denies any current lower extremity edema.  Has been checking blood pressures, right arm pressures 156/84.  Has upcoming vascular consultation.  Patient happy with weight loss and plans to do better with dietary changes Case Manager Clinical Goal(s):  patient will demonstrate improved adherence to prescribed treatment plan for diabetes self care/management as evidenced by: at least 4 times a day monitoring and recording of CBG,  adherence to ADA/ carb modified diet, adherence to prescribed medication regimen, contacting provider for new or worsened symptoms or questions  Diabetes Interventions:  (Status: Goal on track: NO.)  Long Term Goal  Collaboration with McLean-Scocuzza, Nino Glow, MD regarding development and update of comprehensive plan of care as evidenced by provider attestation and co-signature Inter-disciplinary care team collaboration (see longitudinal plan of care) Provided education to patient about basic DM disease process Reviewed medications with patient and discussed importance of medication adherence Discussed plans with patient for ongoing care management follow up and provided patient with direct contact information for care management team Provided patient with verbal educational materials related to hypo and hyperglycemia and importance of correct treatment Advised patient, providing education and rationale, to check cbg at least 4 times a day and record, calling primary care provider or endocrinologist after consultation for findings  outside established parameters.   Sent Diabetes Education and encouraged patient to review information Discussed diabetic carbohydrate modified diet and foods to limit or avoid; Provided medical nutrition therapy and development of individualized eating Discussed current Hgb A1C and what is normal and what is patient's goal; per patient her goal is below 10-discussed and encouraged patient to review goal with provider and to consider changing goal to lower Promoted self-monitoring of blood glucose levels.  Assessed and addressed barriers to management plan, such as food insecurity, age, developmental ability, depression, anxiety, fear of hypoglycemia or weight gain, as well as medication cost, side effects and complicated regimen Encouraged to keep and attend scheduled medical appointments Congratulated on weight loss and encouraged planned dietary changes Discussed anxiety; encouraged to use diversional activities to help with anxiety;  Emotional support and empathy provided to patient Encouraged to spend more quality time with family Discussed diabetic shoes and foot care Reviewed current blood sugar readings and discussed hypoglycemia prevention and ways to help achieve  better glycemic control; discussed how to treat hypoglycemia to prevent rebound hyperglycemia  Heart Failure Interventions:  (Status: Goal on track: NO.)  Long Term Goal  Basic overview and discussion of pathophysiology of Heart Failure reviewed Provided education on low sodium diet Reviewed Heart Failure Action Plan in depth and provided written copy Assessed need for readable accurate scales in home Provided education about placing scale on hard, flat surface Advised patient to weigh each morning after emptying bladder Discussed importance of daily weight and advised patient to weigh and record daily Reviewed role of diuretics in prevention of fluid overload and management of heart failure Discussed the importance of  keeping all appointments with provider   Patient Goals/Self-Care Activities: Check blood sugar at least 4 times a day Check blood sugar if I feel it is too high or too low Enter blood sugar readings and medication/insulin into daily log Take the blood sugar log to all doctor visits  Discuss goal A1C with endocrinology  Avoid or limit Carbs/Sweets/Sugars Take insulins as prescribed (25 Units in am and 15 Units in pm) Call office if I gain more than 2 pounds in one day or 5 pounds in one week Obtain home scale Use salt in moderation Watch for swelling in feet, ankles and legs every day; elevate legs when sitting Weigh myself daily and write weights in log Take log to all provider appointments Take BP in right arm, recording reading for provider review Follow Up Plan: The care management team will reach out to the patient again over the next 30 business days.       Care Plan : Heart Failure (Adult)  Updates made by Leona Singleton, RN since 05/21/2021 12:00 AM  Completed 05/21/2021   Problem: Symptom Exacerbation (Heart Failure) Resolved 05/21/2021  Priority: Medium  Note:   RESOLVING DUE TO DUPLICATE GOALS    Long-Range Goal: Patient will report obtaining scale for home within the next 90 days Completed 05/21/2021  Start Date: 09/28/2020  Expected End Date: 09/15/2021  Recent Progress: Not on track  Priority: Medium  Note:   RESOLVING DUE TO DUPLICATE GOALS  Current Barriers:  Knowledge deficit related to basic heart failure pathophysiology and self care management as evidenced by not knowing heart failure diagnosis and treatment/self care management.  Reports now not having scale to weigh at home; last weight at MD office was down to 256 pounds.  Does report compliance with fluid medications.  Denies any current lower extremity edema.  Has been checking blood pressures, right arm pressures 164/69 while left arm 117/54.  Has upcoming vascular consultation Knowledge Deficits related  to heart failure medications Case Manager Clinical Goal(s):  patient will obtain scale to weigh self daily and record patient will verbalize understanding of Heart Failure Action Plan and when to call doctor patient will take all Heart Failure mediations as prescribed patient will weigh daily and record (notifying MD of 3 lb weight gain over night or 5 lb in a week) Interventions:  Collaboration with McLean-Scocuzza, Nino Glow, MD regarding development and update of comprehensive plan of care as evidenced by provider attestation and co-signature Inter-disciplinary care team collaboration (see longitudinal plan of care) Basic overview and discussion of pathophysiology of Heart Failure reviewed  Provided verbal education on low sodium diet Assessed need for readable accurate scales in home and encouraged patient to obtain scale with large print or verbally read out weight to patient; stressed the importance of daily weight monitoring and need for scale in the home  Provided education about placing scale on hard, flat surface Advised patient to weigh each morning after emptying bladder Discussed importance of daily weight and advised patient to weigh and record daily Reviewed role of diuretics in prevention of fluid overload and management of heart failure Barriers to lifestyle changes reviewed and addressed; barriers to treatment reviewed and addressed Healthy lifestyle promoted Self-awareness of signs/symptoms of worsening disease encouraged Assessed understanding of adherence and barriers to treatment plan, as well as lifestyle changes; discussed strategies to address barriers Provided patient ear for venting outlet; emotional support and empathy provided to patient  Patient Goals/Self-Care Activities: Call office if I gain more than 2 pounds in one day or 5 pounds in one week Obtain home scale Use salt in moderation Watch for swelling in feet, ankles and legs every day; elevate legs when  sitting Weigh myself daily and write weights in log Take log to all provider appointments Take BP in right arm, recording reading for provider review Follow Up Plan: The care management team will reach out to the patient again over the next 30 business days.        Plan:The care management team will reach out to the patient again over the next 30 days.  [name] RN, MSN RN Care Management Coordinator [place] [phone] [email or web address]

## 2021-05-22 ENCOUNTER — Encounter: Payer: Self-pay | Admitting: Cardiovascular Disease

## 2021-05-22 ENCOUNTER — Other Ambulatory Visit: Payer: Self-pay

## 2021-05-22 ENCOUNTER — Ambulatory Visit (INDEPENDENT_AMBULATORY_CARE_PROVIDER_SITE_OTHER): Payer: Medicare Other | Admitting: Cardiovascular Disease

## 2021-05-22 VITALS — BP 170/70 | HR 88 | Ht 63.0 in | Wt 252.2 lb

## 2021-05-22 DIAGNOSIS — I6521 Occlusion and stenosis of right carotid artery: Secondary | ICD-10-CM

## 2021-05-22 DIAGNOSIS — G458 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: Secondary | ICD-10-CM

## 2021-05-22 DIAGNOSIS — I10 Essential (primary) hypertension: Secondary | ICD-10-CM | POA: Diagnosis not present

## 2021-05-22 DIAGNOSIS — I6523 Occlusion and stenosis of bilateral carotid arteries: Secondary | ICD-10-CM

## 2021-05-22 DIAGNOSIS — E785 Hyperlipidemia, unspecified: Secondary | ICD-10-CM

## 2021-05-22 DIAGNOSIS — I5032 Chronic diastolic (congestive) heart failure: Secondary | ICD-10-CM | POA: Diagnosis not present

## 2021-05-22 DIAGNOSIS — I739 Peripheral vascular disease, unspecified: Secondary | ICD-10-CM | POA: Diagnosis not present

## 2021-05-22 DIAGNOSIS — I700 Atherosclerosis of aorta: Secondary | ICD-10-CM

## 2021-05-22 DIAGNOSIS — R002 Palpitations: Secondary | ICD-10-CM

## 2021-05-22 MED ORDER — HYDRALAZINE HCL 100 MG PO TABS
100.0000 mg | ORAL_TABLET | Freq: Four times a day (QID) | ORAL | 3 refills | Status: DC
Start: 2021-05-22 — End: 2021-08-15

## 2021-05-22 NOTE — Patient Instructions (Addendum)
Medication Instructions:  Please INCREASE hydralazine up to 100 mg 4 times a day   If you need a refill on your cardiac medications before your next appointment, please call your pharmacy.   Lab work: No new labs needed  Testing/Procedures: No new testing needed  Follow-Up: At [facility], you and your health needs are our priority.  As part of our continuing mission to provide you with exceptional heart care, we have created designated Provider Care Teams.  These Care Teams include your primary Cardiologist (physician) and Advanced Practice Providers (APPs -  Physician Assistants and Nurse Practitioners) who all work together to provide you with the care you need, when you need it.  You will need a follow up appointment in 6 months  Providers on your designated Care Team:   Murray Hodgkins, NP Christell Faith, PA-C Cadence Kathlen Mody, [place]  COVID-19 Vaccine Information can be found at: [email or web address] For questions related to vaccine distribution or appointments, please email [email or web address] or call [phone].

## 2021-06-05 ENCOUNTER — Ambulatory Visit (INDEPENDENT_AMBULATORY_CARE_PROVIDER_SITE_OTHER): Payer: Medicare Other | Admitting: Podiatry

## 2021-06-05 ENCOUNTER — Other Ambulatory Visit: Payer: Self-pay

## 2021-06-05 DIAGNOSIS — E1142 Type 2 diabetes mellitus with diabetic polyneuropathy: Secondary | ICD-10-CM | POA: Diagnosis not present

## 2021-06-05 DIAGNOSIS — B351 Tinea unguium: Secondary | ICD-10-CM

## 2021-06-05 DIAGNOSIS — M79675 Pain in left toe(s): Secondary | ICD-10-CM | POA: Diagnosis not present

## 2021-06-05 DIAGNOSIS — M79674 Pain in right toe(s): Secondary | ICD-10-CM

## 2021-06-13 ENCOUNTER — Encounter: Payer: Self-pay | Admitting: Podiatry

## 2021-06-13 NOTE — Progress Notes (Signed)
Subjective: Alexis Farmer is a 80 y.o. female patient seen today for at risk foot care follow up. She has h/o diabetic neuropathy. She is seen for cc of  painful thick toenails that are difficult to trim. Pain interferes with ambulation. Aggravating factors include wearing enclosed shoe gear. Pain is relieved with periodic professional debridement.  New problems reported today: None.  Patient states their blood glucose was 215 mg/dl today.   PCP is McLean-Scocuzza, Nino Glow, MD. Last visit was: 04/19/2021.  Allergies  Allergen Reactions   Celexa [Citalopram]     Diarrhea upset stomach    Jardiance [Empagliflozin] Other (See Comments)    Reaction not recalled   Norvasc [Amlodipine]     Leg edema   Tape Other (See Comments)    Leaves the skin "raw" if left on for a period of time- tolerates paper tape   Penicillin V Rash   Penicillins Rash    Has patient had a PCN reaction causing immediate rash, facial/tongue/throat swelling, SOB or lightheadedness with hypotension: Yes Has patient had a PCN reaction causing severe rash involving mucus membranes or skin necrosis: No Has patient had a PCN reaction that required hospitalization No Has patient had a PCN reaction occurring within the last 10 years: Yes If all of the above answers are "NO", then may proceed with Cephalosporin use.     Objective: Physical Exam  General: Patient is a pleasant 80 y.o. African American female morbidly obese in NAD. AAO x 3.   Neurovascular Examination: CFT <3 seconds b/l LE. Nonpalpable DP pulse(s) b/l LE. Nonpalpable PT pulse(s) b/l LE. Pedal hair absent. No pain with calf compression b/l. Nonpitting edema noted BLE. Evidence of chronic venous insufficiency b/l LE. No cyanosis or clubbing noted b/l LE.  Protective sensation decreased with 10 gram monofilament b/l.  Dermatological:  Pedal integument with normal turgor, texture and tone b/l LE. No open wounds b/l. No interdigital macerations b/l.  Toenails 1-5 b/l elongated, thickened, discolored with subungual debris. +Tenderness with dorsal palpation of nailplates. No hyperkeratotic or porokeratotic lesions present.  Musculoskeletal:  Muscle strength 5/5 to all lower extremity muscle groups bilaterally. Pes planus deformity noted bilateral LE.  Assessment: 1. Pain due to onychomycosis of toenails of both feet   2. Diabetic peripheral neuropathy associated with type 2 diabetes mellitus ([place])    Plan: Patient was evaluated and treated and all questions answered. Consent given for treatment as described below: -Continue foot and shoe inspections daily. Monitor blood glucose per PCP/Endocrinologist's recommendations. -Patient to continue soft, supportive shoe gear daily. -Mycotic toenails 1-5 bilaterally were debrided in length and girth with sterile nail nippers and dremel without incident. -Patient/POA to call should there be question/concern in the interim.  Return in about 3 months (around 09/03/2021).  [name], DPM

## 2021-06-14 ENCOUNTER — Telehealth: Payer: Self-pay | Admitting: *Deleted

## 2021-06-14 ENCOUNTER — Telehealth: Payer: Medicare Other

## 2021-06-14 NOTE — Telephone Encounter (Signed)
°  Care Management   Follow Up Note   06/14/2021 Name: Alexis Farmer MRN: [id number] DOB: 1941/02/19   Referred by: McLean-Scocuzza, Nino Glow, MD Reason for referral : Chronic Care Management (DM, HTN)   An unsuccessful telephone outreach was attempted today. The patient was referred to the case management team for assistance with care management and care coordination.   Follow Up Plan: RNCM will seek assistance from Care Guides in rescheduling appointment within the next 30 days.  [name] RN, MSN RN Care Management Coordinator [place] [phone] [email or web address]

## 2021-06-17 DIAGNOSIS — E1159 Type 2 diabetes mellitus with other circulatory complications: Secondary | ICD-10-CM

## 2021-06-17 DIAGNOSIS — I152 Hypertension secondary to endocrine disorders: Secondary | ICD-10-CM | POA: Diagnosis not present

## 2021-06-17 DIAGNOSIS — I5032 Chronic diastolic (congestive) heart failure: Secondary | ICD-10-CM | POA: Diagnosis not present

## 2021-06-17 DIAGNOSIS — E1129 Type 2 diabetes mellitus with other diabetic kidney complication: Secondary | ICD-10-CM | POA: Diagnosis not present

## 2021-06-23 ENCOUNTER — Telehealth: Payer: Self-pay

## 2021-06-23 ENCOUNTER — Ambulatory Visit (INDEPENDENT_AMBULATORY_CARE_PROVIDER_SITE_OTHER): Payer: Medicare Other | Admitting: *Deleted

## 2021-06-23 DIAGNOSIS — E1129 Type 2 diabetes mellitus with other diabetic kidney complication: Secondary | ICD-10-CM

## 2021-06-23 DIAGNOSIS — E1159 Type 2 diabetes mellitus with other circulatory complications: Secondary | ICD-10-CM

## 2021-06-23 NOTE — Chronic Care Management (AMB) (Signed)
°  Care Management   Note  06/23/2021 Name: Alexis Farmer MRN: [id number] DOB: 1940/08/18  Alexis Farmer is a 81 y.o. year old female who is a primary care patient of McLean-Scocuzza, Nino Glow, MD and is actively engaged with the care management team. I reached out to [name] by phone today to assist with re-scheduling a follow up visit with the RN Case Manager  Follow up plan: Unsuccessful telephone outreach attempt made. A HIPAA compliant phone message was left for the patient providing contact information and requesting a return call.  The care management team will reach out to the patient again over the next 7 days.  If patient returns call to provider office, please advise to call [place]  at [place], [place], [place], [place] [address] Direct Dial: [phone] [email or web address]

## 2021-06-23 NOTE — Patient Instructions (Addendum)
Visit Information  Thank you for taking time to visit with me today. Please don't hesitate to contact me if I can be of assistance to you before our next scheduled telephone appointment.  Following are the goals we discussed today:  Check blood sugar at least 4 times a day Check blood sugar if I feel it is too high or too low Enter blood sugar readings and medication/insulin into daily log Take the blood sugar log to all doctor visits  Discuss goal A1C with endocrinology  Avoid or limit Carbs/Sweets/Sugars Take insulins as prescribed (25 Units in am and 15 Units in pm) Call office if I gain more than 2 pounds in one day or 5 pounds in one week Obtain home scale Use salt in moderation Watch for swelling in feet, ankles and legs every day; elevate legs when sitting Weigh myself daily and write weights in log Take log to all provider appointments Take BP in right arm, recording reading for provider review  Our next appointment is by telephone on 1/27 at 1100  Please call the care guide team at [phone] if you need to cancel or reschedule your appointment.   If you are experiencing a Mental Health or [place] or need someone to talk to, please call the Suicide and Crisis Lifeline: 988 call the [place] National Suicide Prevention Lifeline: 352-181-9665 or TTY: (518)195-9799 TTY 951-425-2670) to talk to a trained counselor call 1-800-273-TALK (toll free, 24 hour hotline) go to [facility] [place] [phone]) call 911   The patient verbalized understanding of instructions, educational materials, and care plan provided today and declined offer to receive copy of patient instructions, educational materials, and care plan.   [name] RN, MSN RN Care Management Coordinator [place] [phone] [email or web address]

## 2021-06-25 NOTE — Chronic Care Management (AMB) (Signed)
Chronic Care Management   CCM RN Visit Note  06/25/2021 Name: Alexis Farmer MRN: [id number] DOB: 1941/03/13  Subjective: Alexis Farmer is a 81 y.o. year old female who is a primary care patient of McLean-Scocuzza, Nino Glow, MD. The care management team was consulted for assistance with disease management and care coordination needs.    Engaged with patient by telephone for follow up visit in response to provider referral for case management and/or care coordination services.   Consent to Services:  The patient was given information about Chronic Care Management services, agreed to services, and gave verbal consent prior to initiation of services.  Please see initial visit note for detailed documentation.   Patient agreed to services and verbal consent obtained.   Assessment: Review of patient past medical history, allergies, medications, health status, including review of consultants reports, laboratory and other test data, was performed as part of comprehensive evaluation and provision of chronic care management services.   SDOH (Social Determinants of Health) assessments and interventions performed:    CCM Care Plan  Allergies  Allergen Reactions   Celexa [Citalopram]     Diarrhea upset stomach    Jardiance [Empagliflozin] Other (See Comments)    Reaction not recalled   Norvasc [Amlodipine]     Leg edema   Tape Other (See Comments)    Leaves the skin "raw" if left on for a period of time- tolerates paper tape   Penicillin V Rash   Penicillins Rash    Has patient had a PCN reaction causing immediate rash, facial/tongue/throat swelling, SOB or lightheadedness with hypotension: Yes Has patient had a PCN reaction causing severe rash involving mucus membranes or skin necrosis: No Has patient had a PCN reaction that required hospitalization No Has patient had a PCN reaction occurring within the last 10 years: Yes If all of the above answers are "NO", then may proceed with  Cephalosporin use.     Outpatient Encounter Medications as of 06/23/2021  Medication Sig   acetaminophen (TYLENOL) 500 MG tablet Take 500 mg by mouth every 6 (six) hours as needed for mild pain or headache.   ammonium lactate (AMLACTIN) 12 % lotion Apply 1 application topically 2 (two) times daily. Apply to feet once daily. Do not apply between toes.   Ascorbic Acid (VITAMIN C PO) Take 1 tablet by mouth daily.   Ascorbic Acid 500 MG CHEW Chew by mouth.   aspirin EC 81 MG tablet Take 81 mg by mouth 2 (two) times a week. Swallow whole.   blood glucose meter kit and supplies KIT Accu chek, Dx code E11.65, check 3 times daily   cloNIDine (CATAPRES) 0.2 MG tablet Take 1 tablet (0.2 mg total) by mouth 3 (three) times daily.   glucose blood test strip Use as instructed tid  Accuchek Guide   hydrALAZINE (APRESOLINE) 100 MG tablet Take 1 tablet (100 mg total) by mouth 4 (four) times daily.   insulin NPH Human (NOVOLIN N) 100 UNIT/ML injection INJECT 20 UNITS IN THE MORNING AND THE EVENING WITH A MEAL   insulin regular (NOVOLIN R) 100 units/mL injection Inject 0-0.08 mLs (0-8 Units total) into the skin 3 (three) times daily before meals. (Patient taking differently: Inject 15 Units into the skin See admin instructions. Inject 15 units into the skin two to three times a day with food, PER SLIDING SCALE)   Insulin Syringe-Needle U-100 (B-D INS SYR ULTRAFINE .5CC/30G) 30G X 1/2" 0.5 ML MISC 1 Device by Does not apply route daily. Up  to 5x per day with insulin bid (NPH) and tid (Regular)   LORazepam (ATIVAN) 0.5 MG tablet Take 1 tablet (0.5 mg total) by mouth daily as needed for anxiety. D/c 0.25   LORazepam (ATIVAN) 0.5 MG tablet Take by mouth.   meclizine (ANTIVERT) 12.5 MG tablet Take 1 tablet (12.5 mg total) by mouth daily as needed for dizziness. (Patient not taking: Reported on 05/22/2021)   Multiple Vitamins-Minerals (CENTRUM SILVER 50+WOMEN) TABS Take 1 tablet by mouth daily with breakfast.   mupirocin  ointment (BACTROBAN) 2 % Apply 1 application topically 2 (two) times daily. Right index finger and left forearm   polyethylene glycol powder (GLYCOLAX/MIRALAX) 17 GM/SCOOP powder Take 17 g by mouth daily as needed for moderate constipation or severe constipation. Can take up to 2x per day prn. Mix with 8 ounces of liquid   PYRIDOXINE HCL PO Take by mouth.   rosuvastatin (CRESTOR) 20 MG tablet TAKE 1 TABLET BY MOUTH EVERY DAY   spironolactone (ALDACTONE) 25 MG tablet Take 1 tablet (25 mg total) by mouth daily.   torsemide (DEMADEX) 10 MG tablet Take 10 mg by mouth daily.   No facility-administered encounter medications on file as of 06/23/2021.    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 ([place]) 05/30/2020   Retinopathy 05/30/2020   Primary hypertension 03/25/2020   Multinodular goiter 02/04/2020   Peripheral edema 12/22/2019   Proteinuria 12/22/2019   Constipation 11/17/2019   Recurrent falls 08/13/2019   Physical deconditioning 08/13/2019   Abdominal aortic atherosclerosis ([place]) 07/24/2019   Morbid obesity with BMI of 45.0-49.9, adult ([place]) 06/09/2019   Hypercalcemia 03/18/2019   Round hole, unspecified eye 02/06/2019   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   Disorder of rotator cuff 06/24/2018   Abnormal gait 03/26/2018   Fall 03/26/2018   Arthritis  03/26/2018   Thyroid nodule 01/20/2018   Dysuria 03/22/2017   Cervical spondylosis with radiculopathy 01/26/2017   Radiculopathy due to cervical spondylosis 01/26/2017   Rectal hemorrhage 07/29/2016   Carotid artery stenosis 07/19/2016   PAD (peripheral artery disease) ([place]) 07/19/2016   Swelling of lower leg 07/19/2016   Bradycardia 07/17/2016   Postmenopausal bleeding 07/17/2016   Chronic diastolic CHF (congestive heart failure) ([place]) 05/31/2016   Cough 01/25/2016   Chronic fatigue 11/15/2015   Depression, recurrent ([place]) 10/13/2015   Osteoarthritis 10/13/2015   Vertigo 10/13/2015   Anxiety 09/13/2015   Gastrointestinal hemorrhage 08/03/2015   Hyperglycemia due to type 2 diabetes mellitus ([place]) 03/11/2015   Mixed hyperlipidemia 08/26/2014   Essential hypertension 04/08/2014   Diabetes mellitus type II, uncontrolled 04/08/2014   Chronic kidney disease, stage 2 (mild) 04/08/2014   Edema of foot 04/08/2014   Morbid obesity ([place]) 04/08/2014   Low back pain 04/08/2014   Diabetic polyneuropathy ([place]) 04/08/2014   Type 2 diabetes mellitus with other diabetic kidney complication ([place]) 12/09/7626    Conditions to be addressed/monitored:CHF, HTN, and DMII  Care Plan : RNCM  Diabetes Type 2 & Heart Failure (Adult)  Updates made by Leona Singleton, RN since 06/25/2021 12:00 AM     Problem: Glycemic & Heart Failure Management (Diabetes, Type 2)   Priority: Medium     Long-Range Goal: Patient will verbalize decrease in Hgb A1C by 0.3 points within the next 90 days   Start Date: 09/28/2020  Expected End Date: 08/15/2021  Recent Progress: Not on track  Priority: Medium  Note:   Objective:  Lab Results  Component Value Date   HGBA1C 9.9 (H) 04/19/2021  Current Barriers:  Knowledge Deficits related to basic Diabetes pathophysiology and self care/management as evidenced by continued elevated blood sugars and Hgb A1C increased to 9.9.   Fasting blood sugar this morning was 217.  (ranges vary as she tries to minimize lows).  Acknowledges she is starting to do better with her food choices in hopes for better numbers.  Reports being compliant with all her medications and insulins.     Lack of self motivation to self management of diabetes Knowledge deficit related to basic heart failure pathophysiology and self care management as evidenced by not knowing heart failure diagnosis and treatment/self care management.  Reports now not having scale to weigh at home; last weight at MD office was down to 236 pounds.  Does report compliance with fluid medications.  Denies any current lower extremity edema.  Has been checking blood pressures, but does not have numbers in front of he for review.  Patient happy with weight loss and plans to do better with dietary changes Case Manager Clinical Goal(s):  patient will demonstrate improved adherence to prescribed treatment plan for diabetes self care/management as evidenced by: at least 4 times a day monitoring and recording of CBG,  adherence to ADA/ carb modified diet, adherence to prescribed medication regimen, contacting provider for new or worsened symptoms or questions  Diabetes Interventions:  (Status: Goal on track: NO.)  Long Term Goal  Collaboration with McLean-Scocuzza, Nino Glow, MD regarding development and update of comprehensive plan of care as evidenced by provider attestation and co-signature Inter-disciplinary care team collaboration (see longitudinal plan of care) Provided education to patient about basic DM disease process Reviewed medications with patient and discussed importance of medication adherence Discussed plans with patient for ongoing care management follow up and provided patient with direct contact information for care management team Provided patient with verbal educational materials related to hypo and hyperglycemia and importance of correct treatment Advised patient, providing education and rationale, to check cbg  at least 4 times a day and record, calling primary care provider or endocrinologist after consultation for findings outside established parameters.   Sent Diabetes Education and encouraged patient to review information Discussed diabetic carbohydrate modified diet and foods to limit or avoid; Provided medical nutrition therapy and development of individualized eating Discussed current Hgb A1C and what is normal and what is patient's goal; per patient her goal is below 10-discussed and encouraged patient to review goal with provider and to consider changing goal to lower Promoted self-monitoring of blood glucose levels.  Assessed and addressed barriers to management plan, such as food insecurity, age, developmental ability, depression, anxiety, fear of hypoglycemia or weight gain, as well as medication cost, side effects and complicated regimen Encouraged to keep and attend scheduled medical appointments Congratulated on weight loss and encouraged planned dietary changes Discussed anxiety; encouraged to use diversional activities to help with anxiety;  Emotional support and empathy provided to patient Encouraged  to spend more quality time with family Discussed diabetic shoes and foot care Reviewed current blood sugar readings and discussed hypoglycemia prevention and ways to help achieve better glycemic control; discussed how to treat hypoglycemia to prevent rebound hyperglycemia  Heart Failure Interventions:  (Status: Goal on track: NO.)  Long Term Goal  Basic overview and discussion of pathophysiology of Heart Failure reviewed Provided education on low sodium diet Reviewed Heart Failure Action Plan in depth and provided written copy Assessed need for readable accurate scales in home Provided education about placing scale on hard, flat surface Advised patient to weigh each morning after emptying bladder Discussed importance of daily weight and advised patient to weigh and record daily Reviewed  role of diuretics in prevention of fluid overload and management of heart failure Discussed the importance of keeping all appointments with provider   Patient Goals/Self-Care Activities: Check blood sugar at least 4 times a day Check blood sugar if I feel it is too high or too low Enter blood sugar readings and medication/insulin into daily log Take the blood sugar log to all doctor visits  Discuss goal A1C with endocrinology  Avoid or limit Carbs/Sweets/Sugars Take insulins as prescribed (25 Units in am and 15 Units in pm) Call office if I gain more than 2 pounds in one day or 5 pounds in one week Obtain home scale Use salt in moderation Watch for swelling in feet, ankles and legs every day; elevate legs when sitting Weigh myself daily and write weights in log Take log to all provider appointments Take BP in right arm, recording reading for provider review Follow Up Plan: The care management team will reach out to the patient again over the next 30 business days.        Plan:The care management team will reach out to the patient again over the next 30 days.  [name] RN, MSN RN Care Management Coordinator [place] [phone] [email or web address]

## 2021-07-05 ENCOUNTER — Other Ambulatory Visit: Payer: Self-pay | Admitting: Internal Medicine

## 2021-07-05 DIAGNOSIS — Z794 Long term (current) use of insulin: Secondary | ICD-10-CM

## 2021-07-05 DIAGNOSIS — E1165 Type 2 diabetes mellitus with hyperglycemia: Secondary | ICD-10-CM

## 2021-07-06 ENCOUNTER — Telehealth: Payer: Self-pay | Admitting: Internal Medicine

## 2021-07-06 NOTE — Telephone Encounter (Signed)
Pt called in requesting refill on medication (glucose blood test strip). Pt stated that she call her pharmacy and they advise her that she had no more refills on her strips. Pt stated that the pharmacy advise her that they will send over the request for the refill. Pt requesting callback with updates.

## 2021-07-06 NOTE — Telephone Encounter (Signed)
Medication request received electronically from pharmacy and sent in.   Patient informed and verbalized understanding.

## 2021-07-11 ENCOUNTER — Telehealth: Payer: Self-pay | Admitting: Internal Medicine

## 2021-07-11 ENCOUNTER — Other Ambulatory Visit: Payer: Self-pay | Admitting: Internal Medicine

## 2021-07-11 MED ORDER — TORSEMIDE 10 MG PO TABS: 10.0000 mg | ORAL_TABLET | Freq: Every day | ORAL | 3 refills | Status: DC

## 2021-07-11 NOTE — Telephone Encounter (Signed)
I was not filler of torsemide renal/kidney doctor was in the future if a medication filled by one provider that is who should do the refills inform pt of this but will fill for her if needed  Sent [facility]

## 2021-07-11 NOTE — Telephone Encounter (Signed)
Patient called in stating need a refill for Torsemide 10MG   (not the 20 mg but 10MG ) Patient stated she still have 3 days supply. Patient would like a call from nurse @ [phone].

## 2021-07-11 NOTE — Telephone Encounter (Signed)
Pt is requesting a refill of torsemide 10 mg. This is a historical medication. Is it okay to refill?

## 2021-07-12 NOTE — Telephone Encounter (Signed)
Medication sent in for Patient yesterday 07/11/21. Patient informed and verbalized understanding.   Patient is still seeing the kidney specialist. Informed the Patient that all future refills and dose changes to this medication will have to come from the renal specialist. Patient verbalized understanding

## 2021-07-14 ENCOUNTER — Ambulatory Visit: Payer: Medicare Other | Admitting: *Deleted

## 2021-07-14 DIAGNOSIS — E1129 Type 2 diabetes mellitus with other diabetic kidney complication: Secondary | ICD-10-CM

## 2021-07-14 DIAGNOSIS — I5032 Chronic diastolic (congestive) heart failure: Secondary | ICD-10-CM

## 2021-07-14 DIAGNOSIS — I152 Hypertension secondary to endocrine disorders: Secondary | ICD-10-CM

## 2021-07-14 NOTE — Patient Instructions (Signed)
Visit Information  Thank you for taking time to visit with me today. Please don't hesitate to contact me if I can be of assistance to you before our next scheduled telephone appointment.  Following are the goals we discussed today:  Check blood sugar at least 4 times a day Check blood sugar if I feel it is too high or too low Enter blood sugar readings and medication/insulin into daily log Take the blood sugar log to all doctor visits  Discuss goal A1C with endocrinology  Avoid or limit Carbs/Sweets/Sugars Take insulins as prescribed (25 Units in am and 15 Units in pm) Call office if I gain more than 2 pounds in one day or 5 pounds in one week Obtain home scale Use salt in moderation Watch for swelling in feet, ankles and legs every day; elevate legs when sitting Weigh myself daily and write weights in log Take log to all provider appointments Take BP in right arm, recording reading for provider review  Our next appointment is by telephone on 2/27 at 1100  Please call the care guide team at [phone] if you need to cancel or reschedule your appointment.   If you are experiencing a Mental Health or [place] or need someone to talk to, please call the Suicide and Crisis Lifeline: 988 call the [place] National Suicide Prevention Lifeline: (403) 497-8790 or TTY: 737-855-7655 TTY (623) 430-3437) to talk to a trained counselor call 1-800-273-TALK (toll free, 24 hour hotline) go to [facility] [phone]) call 911   The patient verbalized understanding of instructions, educational materials, and care plan provided today and declined offer to receive copy of patient instructions, educational materials, and care plan.   [name] RN, MSN RN Care Management Coordinator [place] [phone] [email or web address]

## 2021-07-14 NOTE — Chronic Care Management (AMB) (Signed)
Chronic Care Management   CCM RN Visit Note  07/14/2021 Name: Alexis Farmer MRN: [id number] DOB: 12-09-1940  Subjective: Alexis Farmer is a 81 y.o. year old female who is a primary care patient of McLean-Scocuzza, Nino Glow, MD. The care management team was consulted for assistance with disease management and care coordination needs.    Engaged with patient by telephone for follow up visit in response to provider referral for case management and/or care coordination services.   Consent to Services:  The patient was given information about Chronic Care Management services, agreed to services, and gave verbal consent prior to initiation of services.  Please see initial visit note for detailed documentation.   Patient agreed to services and verbal consent obtained.   Assessment: Review of patient past medical history, allergies, medications, health status, including review of consultants reports, laboratory and other test data, was performed as part of comprehensive evaluation and provision of chronic care management services.   SDOH (Social Determinants of Health) assessments and interventions performed:    CCM Care Plan  Allergies  Allergen Reactions   Celexa [Citalopram]     Diarrhea upset stomach    Jardiance [Empagliflozin] Other (See Comments)    Reaction not recalled   Norvasc [Amlodipine]     Leg edema   Tape Other (See Comments)    Leaves the skin "raw" if left on for a period of time- tolerates paper tape   Penicillin V Rash   Penicillins Rash    Has patient had a PCN reaction causing immediate rash, facial/tongue/throat swelling, SOB or lightheadedness with hypotension: Yes Has patient had a PCN reaction causing severe rash involving mucus membranes or skin necrosis: No Has patient had a PCN reaction that required hospitalization No Has patient had a PCN reaction occurring within the last 10 years: Yes If all of the above answers are "NO", then may proceed with  Cephalosporin use.     Outpatient Encounter Medications as of 07/14/2021  Medication Sig   ACCU-CHEK GUIDE test strip USE AS INSTRUCTED 3 TIMES DAILY   acetaminophen (TYLENOL) 500 MG tablet Take 500 mg by mouth every 6 (six) hours as needed for mild pain or headache.   ammonium lactate (AMLACTIN) 12 % lotion Apply 1 application topically 2 (two) times daily. Apply to feet once daily. Do not apply between toes.   Ascorbic Acid (VITAMIN C PO) Take 1 tablet by mouth daily.   Ascorbic Acid 500 MG CHEW Chew by mouth.   aspirin EC 81 MG tablet Take 81 mg by mouth 2 (two) times a week. Swallow whole.   blood glucose meter kit and supplies KIT Accu chek, Dx code E11.65, check 3 times daily   cloNIDine (CATAPRES) 0.2 MG tablet Take 1 tablet (0.2 mg total) by mouth 3 (three) times daily.   hydrALAZINE (APRESOLINE) 100 MG tablet Take 1 tablet (100 mg total) by mouth 4 (four) times daily.   insulin NPH Human (NOVOLIN N) 100 UNIT/ML injection INJECT 20 UNITS IN THE MORNING AND THE EVENING WITH A MEAL   insulin regular (NOVOLIN R) 100 units/mL injection Inject 0-0.08 mLs (0-8 Units total) into the skin 3 (three) times daily before meals. (Patient taking differently: Inject 15 Units into the skin See admin instructions. Inject 15 units into the skin two to three times a day with food, PER SLIDING SCALE)   Insulin Syringe-Needle U-100 (B-D INS SYR ULTRAFINE .5CC/30G) 30G X 1/2" 0.5 ML MISC 1 Device by Does not apply route daily. Up to  5x per day with insulin bid (NPH) and tid (Regular)   LORazepam (ATIVAN) 0.5 MG tablet Take 1 tablet (0.5 mg total) by mouth daily as needed for anxiety. D/c 0.25   LORazepam (ATIVAN) 0.5 MG tablet Take by mouth.   meclizine (ANTIVERT) 12.5 MG tablet Take 1 tablet (12.5 mg total) by mouth daily as needed for dizziness. (Patient not taking: Reported on 05/22/2021)   Multiple Vitamins-Minerals (CENTRUM SILVER 50+WOMEN) TABS Take 1 tablet by mouth daily with breakfast.   mupirocin  ointment (BACTROBAN) 2 % Apply 1 application topically 2 (two) times daily. Right index finger and left forearm   polyethylene glycol powder (GLYCOLAX/MIRALAX) 17 GM/SCOOP powder Take 17 g by mouth daily as needed for moderate constipation or severe constipation. Can take up to 2x per day prn. Mix with 8 ounces of liquid   PYRIDOXINE HCL PO Take by mouth.   rosuvastatin (CRESTOR) 20 MG tablet TAKE 1 TABLET BY MOUTH EVERY DAY   spironolactone (ALDACTONE) 25 MG tablet Take 1 tablet (25 mg total) by mouth daily.   torsemide (DEMADEX) 10 MG tablet Take 1 tablet (10 mg total) by mouth daily.   No facility-administered encounter medications on file as of 07/14/2021.    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   Pure hypercholesterolemia 08/10/2020   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 ([place]) 05/30/2020   Retinopathy 05/30/2020   Primary hypertension 03/25/2020   Multinodular goiter 02/04/2020   Peripheral edema 12/22/2019   Proteinuria 12/22/2019   Constipation 11/17/2019   Recurrent falls 08/13/2019   Physical deconditioning 08/13/2019   Abdominal aortic atherosclerosis ([place]) 07/24/2019   Morbid obesity with BMI of 45.0-49.9, adult ([place]) 06/09/2019   Hypercalcemia 03/18/2019   Round hole, unspecified eye 02/06/2019   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   Disorder of rotator cuff 06/24/2018   Abnormal gait 03/26/2018   Fall  03/26/2018   Arthritis 03/26/2018   Thyroid nodule 01/20/2018   Dysuria 03/22/2017   Cervical spondylosis with radiculopathy 01/26/2017   Radiculopathy due to cervical spondylosis 01/26/2017   Rectal hemorrhage 07/29/2016   Carotid artery stenosis 07/19/2016   PAD (peripheral artery disease) ([place]) 07/19/2016   Swelling of lower leg 07/19/2016   Bradycardia 07/17/2016   Postmenopausal bleeding 07/17/2016   Chronic diastolic CHF (congestive heart failure) ([place]) 05/31/2016   Cough 01/25/2016   Chronic fatigue 11/15/2015   Depression, recurrent ([place]) 10/13/2015   Osteoarthritis 10/13/2015   Vertigo 10/13/2015   Anxiety 09/13/2015   Gastrointestinal hemorrhage 08/03/2015   Hyperglycemia due to type 2 diabetes mellitus ([place]) 03/11/2015   Mixed hyperlipidemia 08/26/2014   Essential hypertension 04/08/2014   Diabetes mellitus type II, uncontrolled 04/08/2014   Chronic kidney disease, stage 2 (mild) 04/08/2014   Edema of foot 04/08/2014   Morbid obesity ([place]) 04/08/2014   Low back pain 04/08/2014   Diabetic polyneuropathy ([place]) 04/08/2014   Type 2 diabetes mellitus with other diabetic kidney complication ([place]) 68/04/5725    Conditions to be addressed/monitored:CHF and  DMII  Care Plan : RNCM  Diabetes Type 2 & Heart Failure (Adult)  Updates made by Leona Singleton, RN since 07/14/2021 12:00 AM     Problem: Glycemic & Heart Failure Management (Diabetes, Type 2)   Priority: Medium     Long-Range Goal: Patient will verbalize decrease in Hgb A1C by 0.3 points within the next 90 days   Start Date: 09/28/2020  Expected End Date: 08/15/2021  Recent Progress: Not on track  Priority: Medium  Note:   Objective:  Lab Results  Component Value Date   HGBA1C 9.9 (H) 04/19/2021  Current Barriers:  Knowledge Deficits related to basic Diabetes pathophysiology and self care/management as evidenced by continued elevated blood sugars and Hgb A1C increased to 9.9.   Fasting blood sugar this  morning was 145. (Denies any hypo or hyperglycemic episodes).  Acknowledges she is starting to do better with her food choices in hopes for better numbers.  Reports being compliant with all her medications and insulins.  Does report having right wrist pain that was pretty severe but has gotten better; thinks it may be related to picking up grandson;  Also reports the death of her brother Lack of self motivation to self management of diabetes Knowledge deficit related to basic heart failure pathophysiology and self care management as evidenced by not knowing heart failure diagnosis and treatment/self care management.  Reports now not having scale to weigh at home; last weight at MD office was down to 236 pounds.  Does report compliance with fluid medications.  Denies any current lower extremity edema.  Has been checking blood pressures, but does not have numbers in front of he for review.  Patient happy with weight loss and plans to do better with dietary changes Case Manager Clinical Goal(s):  patient will demonstrate improved adherence to prescribed treatment plan for diabetes self care/management as evidenced by: at least 4 times a day monitoring and recording of CBG,  adherence to ADA/ carb modified diet, adherence to prescribed medication regimen, contacting provider for new or worsened symptoms or questions  Diabetes Interventions:  (Status: Goal on track: NO.)  Long Term Goal  Collaboration with McLean-Scocuzza, Nino Glow, MD regarding development and update of comprehensive plan of care as evidenced by provider attestation and co-signature Inter-disciplinary care team collaboration (see longitudinal plan of care) Provided education to patient about basic DM disease process Reviewed medications with patient and discussed importance of medication adherence Discussed plans with patient for ongoing care management follow up and provided patient with direct contact information for care management  team Provided patient with verbal educational materials related to hypo and hyperglycemia and importance of correct treatment Advised patient, providing education and rationale, to check cbg at least 4 times a day and record, calling primary care provider or endocrinologist after consultation for findings outside established parameters.   Sent Diabetes Education and encouraged patient to review information Discussed diabetic carbohydrate modified diet and foods to limit or avoid; Provided medical nutrition therapy and development of individualized eating Discussed current Hgb A1C and what is normal and what is patient's goal; per patient her goal is below 10-discussed and encouraged patient to review goal with provider and to consider changing goal to lower Promoted self-monitoring of blood glucose levels.  Assessed and addressed barriers to management plan, such as food insecurity, age, developmental ability, depression, anxiety, fear of hypoglycemia or weight gain, as well as medication cost, side effects and complicated regimen Encouraged to keep and attend scheduled medical appointments Congratulated  on weight loss and encouraged planned dietary changes Discussed anxiety; encouraged to use diversional activities to help with anxiety;  Emotional support and empathy provided to patient Encouraged to spend more quality time with family Discussed diabetic shoes and foot care Reviewed current blood sugar readings and discussed hypoglycemia prevention and ways to help achieve better glycemic control; discussed how to treat hypoglycemia to prevent rebound hyperglycemia  Heart Failure Interventions:  (Status: Goal on track: NO.)  Long Term Goal  Basic overview and discussion of pathophysiology of Heart Failure reviewed Provided education on low sodium diet Reviewed Heart Failure Action Plan in depth and provided written copy Assessed need for readable accurate scales in home Provided education about  placing scale on hard, flat surface Advised patient to weigh each morning after emptying bladder Discussed importance of daily weight and advised patient to weigh and record daily Reviewed role of diuretics in prevention of fluid overload and management of heart failure Discussed the importance of keeping all appointments with provider  Again encouraged patient to obtain scale for home  Patient Goals/Self-Care Activities: Check blood sugar at least 4 times a day Check blood sugar if I feel it is too high or too low Enter blood sugar readings and medication/insulin into daily log Take the blood sugar log to all doctor visits  Discuss goal A1C with endocrinology  Avoid or limit Carbs/Sweets/Sugars Take insulins as prescribed (25 Units in am and 15 Units in pm) Call office if I gain more than 2 pounds in one day or 5 pounds in one week Obtain home scale Use salt in moderation Watch for swelling in feet, ankles and legs every day; elevate legs when sitting Weigh myself daily and write weights in log Take log to all provider appointments Take BP in right arm, recording reading for provider review Follow Up Plan: The care management team will reach out to the patient again over the next 30 business days.        Plan:The care management team will reach out to the patient again over the next 30 days.  [name] RN, MSN RN Care Management Coordinator [place] [phone] [email or web address]

## 2021-07-18 DIAGNOSIS — E1159 Type 2 diabetes mellitus with other circulatory complications: Secondary | ICD-10-CM | POA: Diagnosis not present

## 2021-07-18 DIAGNOSIS — I509 Heart failure, unspecified: Secondary | ICD-10-CM | POA: Diagnosis not present

## 2021-07-18 DIAGNOSIS — Z7984 Long term (current) use of oral hypoglycemic drugs: Secondary | ICD-10-CM

## 2021-07-19 ENCOUNTER — Other Ambulatory Visit: Payer: Self-pay | Admitting: Internal Medicine

## 2021-07-19 ENCOUNTER — Telehealth: Payer: Self-pay | Admitting: Internal Medicine

## 2021-07-19 DIAGNOSIS — E1165 Type 2 diabetes mellitus with hyperglycemia: Secondary | ICD-10-CM

## 2021-07-19 DIAGNOSIS — Z794 Long term (current) use of insulin: Secondary | ICD-10-CM

## 2021-07-19 NOTE — Telephone Encounter (Signed)
Called [facility] endocrine pt out of insulin pt seen 05/24/21 they should be doing her refills of insulin as they know the doses she should be taking now   Please call pt and advise this and that I called [facility] endocrine for them to send her refills of her insulin 07/19/21 at 3:10 pm

## 2021-07-19 NOTE — Telephone Encounter (Signed)
Pt need medication refill on insulin regular (NOVOLIN R) 100 units/mL injection sent to [facility]

## 2021-07-19 NOTE — Telephone Encounter (Signed)
Spoke with endo. Refills will be sent in per Dr Honor Junes and they will reach out to patient.

## 2021-07-19 NOTE — Chronic Care Management (AMB) (Signed)
°  Care Management   Note  07/19/2021 Name: Alexis Farmer MRN: [id number] DOB: 10/05/1940  Alexis Farmer is a 81 y.o. year old female who is a primary care patient of McLean-Scocuzza, Nino Glow, MD and is actively engaged with the care management team. I reached out to [name] by phone today to assist with re-scheduling a follow up visit with the RN Case Manager  Follow up plan: Telephone appointment with care management team member scheduled for:08/14/2021  Noreene Larsson, [address], [place], [place] [address] Direct Dial: [phone] [email or web address]

## 2021-07-19 NOTE — Telephone Encounter (Signed)
Patient called in regards to previous message. Patient was advised PCP is not who originally prescribed medication for patient however PCP will call provider and get this refilled for patient. Patient got upset and disconnected.

## 2021-08-12 ENCOUNTER — Emergency Department (HOSPITAL_COMMUNITY): Payer: Medicare Other

## 2021-08-12 ENCOUNTER — Inpatient Hospital Stay (HOSPITAL_COMMUNITY)
Admission: EM | Admit: 2021-08-12 | Discharge: 2021-08-15 | DRG: 389 | Disposition: A | Discharge status: Home Health | Payer: Medicare Other | Attending: Internal Medicine | Admitting: Internal Medicine

## 2021-08-12 ENCOUNTER — Other Ambulatory Visit: Payer: Self-pay

## 2021-08-12 ENCOUNTER — Encounter (HOSPITAL_COMMUNITY): Payer: Self-pay

## 2021-08-12 DIAGNOSIS — E1122 Type 2 diabetes mellitus with diabetic chronic kidney disease: Secondary | ICD-10-CM | POA: Diagnosis present

## 2021-08-12 DIAGNOSIS — Z6841 Body Mass Index (BMI) 40.0 and over, adult: Secondary | ICD-10-CM

## 2021-08-12 DIAGNOSIS — Z794 Long term (current) use of insulin: Secondary | ICD-10-CM

## 2021-08-12 DIAGNOSIS — E7800 Pure hypercholesterolemia, unspecified: Secondary | ICD-10-CM | POA: Diagnosis present

## 2021-08-12 DIAGNOSIS — Z888 Allergy status to other drugs, medicaments and biological substances status: Secondary | ICD-10-CM

## 2021-08-12 DIAGNOSIS — R109 Unspecified abdominal pain: Secondary | ICD-10-CM | POA: Diagnosis not present

## 2021-08-12 DIAGNOSIS — K529 Noninfective gastroenteritis and colitis, unspecified: Secondary | ICD-10-CM

## 2021-08-12 DIAGNOSIS — K567 Ileus, unspecified: Principal | ICD-10-CM

## 2021-08-12 DIAGNOSIS — Z823 Family history of stroke: Secondary | ICD-10-CM

## 2021-08-12 DIAGNOSIS — Z8249 Family history of ischemic heart disease and other diseases of the circulatory system: Secondary | ICD-10-CM

## 2021-08-12 DIAGNOSIS — I10 Essential (primary) hypertension: Secondary | ICD-10-CM

## 2021-08-12 DIAGNOSIS — R1084 Generalized abdominal pain: Secondary | ICD-10-CM

## 2021-08-12 DIAGNOSIS — N182 Chronic kidney disease, stage 2 (mild): Secondary | ICD-10-CM | POA: Diagnosis present

## 2021-08-12 DIAGNOSIS — Z7982 Long term (current) use of aspirin: Secondary | ICD-10-CM

## 2021-08-12 DIAGNOSIS — Z880 Allergy status to penicillin: Secondary | ICD-10-CM

## 2021-08-12 DIAGNOSIS — Z79899 Other long term (current) drug therapy: Secondary | ICD-10-CM

## 2021-08-12 DIAGNOSIS — R002 Palpitations: Secondary | ICD-10-CM

## 2021-08-12 DIAGNOSIS — Z20822 Contact with and (suspected) exposure to covid-19: Secondary | ICD-10-CM | POA: Diagnosis present

## 2021-08-12 DIAGNOSIS — E1165 Type 2 diabetes mellitus with hyperglycemia: Secondary | ICD-10-CM | POA: Diagnosis present

## 2021-08-12 DIAGNOSIS — M1990 Unspecified osteoarthritis, unspecified site: Secondary | ICD-10-CM | POA: Diagnosis present

## 2021-08-12 DIAGNOSIS — I6523 Occlusion and stenosis of bilateral carotid arteries: Secondary | ICD-10-CM

## 2021-08-12 DIAGNOSIS — R197 Diarrhea, unspecified: Secondary | ICD-10-CM

## 2021-08-12 DIAGNOSIS — Z833 Family history of diabetes mellitus: Secondary | ICD-10-CM

## 2021-08-12 DIAGNOSIS — I129 Hypertensive chronic kidney disease with stage 1 through stage 4 chronic kidney disease, or unspecified chronic kidney disease: Secondary | ICD-10-CM | POA: Diagnosis present

## 2021-08-12 DIAGNOSIS — I5032 Chronic diastolic (congestive) heart failure: Secondary | ICD-10-CM

## 2021-08-12 DIAGNOSIS — R0602 Shortness of breath: Secondary | ICD-10-CM

## 2021-08-12 HISTORY — DX: Noninfective gastroenteritis and colitis, unspecified: K52.9

## 2021-08-12 LAB — CBC WITH DIFFERENTIAL/PLATELET
Abs Immature Granulocytes: 0.03 10*3/uL (ref 0.00–0.07)
Basophils Absolute: 0 10*3/uL (ref 0.0–0.1)
Basophils Relative: 0 %
Eosinophils Absolute: 0.1 10*3/uL (ref 0.0–0.5)
Eosinophils Relative: 1 %
HCT: 43.1 % (ref 36.0–46.0)
Hemoglobin: 13.8 g/dL (ref 12.0–15.0)
Immature Granulocytes: 0 %
Lymphocytes Relative: 39 %
Lymphs Abs: 2.7 10*3/uL (ref 0.7–4.0)
MCH: 29 pg (ref 26.0–34.0)
MCHC: 32 g/dL (ref 30.0–36.0)
MCV: 90.5 fL (ref 80.0–100.0)
Monocytes Absolute: 0.8 10*3/uL (ref 0.1–1.0)
Monocytes Relative: 12 %
Neutro Abs: 3.3 10*3/uL (ref 1.7–7.7)
Neutrophils Relative %: 48 %
Platelets: 240 10*3/uL (ref 150–400)
RBC: 4.76 MIL/uL (ref 3.87–5.11)
RDW: 13.4 % (ref 11.5–15.5)
WBC: 6.9 10*3/uL (ref 4.0–10.5)
nRBC: 0 % (ref 0.0–0.2)

## 2021-08-12 LAB — COMPREHENSIVE METABOLIC PANEL
ALT: 34 U/L (ref 0–44)
AST: 60 U/L — ABNORMAL HIGH (ref 15–41)
Albumin: 3.8 g/dL (ref 3.5–5.0)
Alkaline Phosphatase: 42 U/L (ref 38–126)
Anion gap: 13 (ref 5–15)
BUN: 15 mg/dL (ref 8–23)
CO2: 26 mmol/L (ref 22–32)
Calcium: 9.4 mg/dL (ref 8.9–10.3)
Chloride: 94 mmol/L — ABNORMAL LOW (ref 98–111)
Creatinine, Ser: 1.19 mg/dL — ABNORMAL HIGH (ref 0.44–1.00)
GFR, Estimated: 46 mL/min — ABNORMAL LOW (ref >60)
Glucose, Bld: 253 mg/dL — ABNORMAL HIGH (ref 70–99)
Potassium: 4.9 mmol/L (ref 3.5–5.1)
Sodium: 133 mmol/L — ABNORMAL LOW (ref 135–145)
Total Bilirubin: 2.1 mg/dL — ABNORMAL HIGH (ref 0.3–1.2)
Total Protein: 7.3 g/dL (ref 6.5–8.1)

## 2021-08-12 LAB — RESP PANEL BY RT-PCR (FLU A&B, COVID) ARPGX2
Influenza A by PCR: NEGATIVE
Influenza B by PCR: NEGATIVE
SARS Coronavirus 2 by RT PCR: NEGATIVE

## 2021-08-12 LAB — URINALYSIS, ROUTINE W REFLEX MICROSCOPIC
Bilirubin Urine: NEGATIVE
Glucose, UA: 150 mg/dL — AB
Ketones, ur: NEGATIVE mg/dL
Nitrite: NEGATIVE
Protein, ur: 100 mg/dL — AB
Specific Gravity, Urine: 1.009 (ref 1.005–1.030)
pH: 7 (ref 5.0–8.0)

## 2021-08-12 LAB — LIPASE, BLOOD: Lipase: 27 U/L (ref 11–51)

## 2021-08-12 LAB — GLUCOSE, CAPILLARY
Glucose-Capillary: 232 mg/dL — ABNORMAL HIGH (ref 70–99)
Glucose-Capillary: 242 mg/dL — ABNORMAL HIGH (ref 70–99)

## 2021-08-12 MED ORDER — ONDANSETRON HCL 4 MG/2ML IJ SOLN
4.0000 mg | Freq: Four times a day (QID) | INTRAMUSCULAR | Status: DC | PRN
  Administered 2021-08-12: 4 mg via INTRAVENOUS
  Filled 2021-08-12: qty 2

## 2021-08-12 MED ORDER — INSULIN GLARGINE-YFGN 100 UNIT/ML ~~LOC~~ SOLN
15.0000 [IU] | Freq: Every day | SUBCUTANEOUS | Status: DC
  Administered 2021-08-12 – 2021-08-15 (×4): 15 [IU] via SUBCUTANEOUS
  Filled 2021-08-12 (×4): qty 0.15

## 2021-08-12 MED ORDER — SODIUM CHLORIDE 0.9 % IV BOLUS
500.0000 mL | Freq: Once | INTRAVENOUS | Status: AC
  Administered 2021-08-12: 500 mL via INTRAVENOUS

## 2021-08-12 MED ORDER — POLYETHYLENE GLYCOL 3350 17 GM/SCOOP PO POWD
17.0000 g | Freq: Every day | ORAL | Status: DC | PRN
  Filled 2021-08-12: qty 255

## 2021-08-12 MED ORDER — IOHEXOL 350 MG/ML SOLN
75.0000 mL | Freq: Once | INTRAVENOUS | Status: AC | PRN
  Administered 2021-08-12: 75 mL via INTRAVENOUS

## 2021-08-12 MED ORDER — ONDANSETRON HCL 4 MG PO TABS: 4.0000 mg | ORAL_TABLET | Freq: Four times a day (QID) | ORAL | Status: DC | PRN

## 2021-08-12 MED ORDER — HYDRALAZINE HCL 20 MG/ML IJ SOLN: 5.0000 mg | Freq: Four times a day (QID) | INTRAMUSCULAR | Status: DC | PRN

## 2021-08-12 MED ORDER — ASPIRIN EC 81 MG PO TBEC
81.0000 mg | DELAYED_RELEASE_TABLET | ORAL | Status: DC
Start: 2021-08-14 — End: 2021-08-15
  Administered 2021-08-14: 81 mg via ORAL
  Filled 2021-08-12: qty 1

## 2021-08-12 MED ORDER — INSULIN ASPART 100 UNIT/ML IJ SOLN
0.0000 [IU] | Freq: Every day | INTRAMUSCULAR | Status: DC
  Administered 2021-08-14: 3 [IU] via SUBCUTANEOUS

## 2021-08-12 MED ORDER — MORPHINE SULFATE (PF) 4 MG/ML IV SOLN
4.0000 mg | Freq: Once | INTRAVENOUS | Status: AC
  Administered 2021-08-12: 4 mg via INTRAVENOUS
  Filled 2021-08-12: qty 1

## 2021-08-12 MED ORDER — CLONIDINE HCL 0.2 MG PO TABS
0.2000 mg | ORAL_TABLET | Freq: Three times a day (TID) | ORAL | Status: DC
  Administered 2021-08-12 – 2021-08-15 (×9): 0.2 mg via ORAL
  Filled 2021-08-12 (×9): qty 1

## 2021-08-12 MED ORDER — INSULIN ASPART 100 UNIT/ML IJ SOLN
0.0000 [IU] | Freq: Three times a day (TID) | INTRAMUSCULAR | Status: DC
  Administered 2021-08-13: 7 [IU] via SUBCUTANEOUS
  Administered 2021-08-13: 4 [IU] via SUBCUTANEOUS
  Administered 2021-08-13: 15 [IU] via SUBCUTANEOUS
  Administered 2021-08-14: 4 [IU] via SUBCUTANEOUS
  Administered 2021-08-14 – 2021-08-15 (×3): 7 [IU] via SUBCUTANEOUS

## 2021-08-12 MED ORDER — SODIUM CHLORIDE 0.9 % IV SOLN: INTRAVENOUS | Status: DC

## 2021-08-12 MED ORDER — ROSUVASTATIN CALCIUM 20 MG PO TABS: 20.0000 mg | ORAL_TABLET | Freq: Every day | ORAL | Status: DC

## 2021-08-12 MED ORDER — SPIRONOLACTONE 25 MG PO TABS
25.0000 mg | ORAL_TABLET | Freq: Every day | ORAL | Status: DC
  Administered 2021-08-13: 25 mg via ORAL
  Filled 2021-08-12: qty 1

## 2021-08-12 MED ORDER — ENOXAPARIN SODIUM 40 MG/0.4ML IJ SOSY
40.0000 mg | PREFILLED_SYRINGE | INTRAMUSCULAR | Status: DC
  Administered 2021-08-12 – 2021-08-14 (×3): 40 mg via SUBCUTANEOUS
  Filled 2021-08-12 (×3): qty 0.4

## 2021-08-12 MED ORDER — HYDRALAZINE HCL 25 MG PO TABS
100.0000 mg | ORAL_TABLET | Freq: Four times a day (QID) | ORAL | Status: DC
  Administered 2021-08-12 – 2021-08-15 (×12): 100 mg via ORAL
  Filled 2021-08-12 (×11): qty 4

## 2021-08-12 MED ORDER — FENTANYL CITRATE PF 50 MCG/ML IJ SOSY
50.0000 ug | PREFILLED_SYRINGE | Freq: Once | INTRAMUSCULAR | Status: AC
  Administered 2021-08-12: 50 ug via INTRAVENOUS
  Filled 2021-08-12: qty 1

## 2021-08-12 MED ORDER — ONDANSETRON HCL 4 MG/2ML IJ SOLN
4.0000 mg | Freq: Once | INTRAMUSCULAR | Status: AC
  Administered 2021-08-12: 4 mg via INTRAVENOUS
  Filled 2021-08-12: qty 2

## 2021-08-12 MED ORDER — TORSEMIDE 10 MG PO TABS
10.0000 mg | ORAL_TABLET | Freq: Every day | ORAL | Status: DC
  Administered 2021-08-13: 10 mg via ORAL
  Filled 2021-08-12: qty 1

## 2021-08-12 MED ORDER — MELATONIN 3 MG PO TABS
3.0000 mg | ORAL_TABLET | Freq: Every evening | ORAL | Status: DC | PRN
  Administered 2021-08-12 – 2021-08-14 (×2): 3 mg via ORAL
  Filled 2021-08-12 (×3): qty 1

## 2021-08-12 MED ORDER — ACETAMINOPHEN 500 MG PO TABS
500.0000 mg | ORAL_TABLET | Freq: Four times a day (QID) | ORAL | Status: DC | PRN
  Administered 2021-08-12 – 2021-08-15 (×5): 500 mg via ORAL
  Filled 2021-08-12 (×5): qty 1

## 2021-08-12 MED ORDER — MORPHINE SULFATE (PF) 4 MG/ML IV SOLN
4.0000 mg | INTRAVENOUS | Status: DC | PRN
  Administered 2021-08-12: 4 mg via INTRAVENOUS
  Filled 2021-08-12 (×2): qty 1

## 2021-08-12 MED ORDER — LORAZEPAM 0.5 MG PO TABS
0.5000 mg | ORAL_TABLET | Freq: Every day | ORAL | Status: DC | PRN
  Administered 2021-08-13 – 2021-08-15 (×3): 0.5 mg via ORAL
  Filled 2021-08-12 (×3): qty 1

## 2021-08-12 NOTE — ED Notes (Signed)
PT states no pain relief with fentanyl. PA notified.

## 2021-08-12 NOTE — ED Notes (Signed)
Helped patient back in bed off the bedside toilet got patient some warm blankets

## 2021-08-12 NOTE — H&P (Signed)
History and Physical    Alexis Farmer NUU:[id number] DOB: 1940/10/20 DOA: 08/12/2021  PCP: McLean-Scocuzza, Nino Glow, MD (Confirm with patient/family/NH records and if not entered, this has to be entered at [facility] point of entry) Patient coming from: Home  I have personally briefly reviewed patient's old medical records in [place]  Chief Complaint: Belly hurts, diarrhea  HPI: Alexis Farmer is a 81 y.o. female with medical history significant of HTN, CKD stage II, HTN, IDDM, morbid obesity, came with persistent nausea vomiting and diarrhea and abdominal pain.  Symptoms started 3 days ago, initially with sharp-like epigastric pain, constant, associated with feeling of nauseous and vomited stomach content x1.  No more vomiting next day but feeling of nausea has persisted, and she has not been eating or drinking much for the last 2 days.  2 days ago, she started to have loose diarrhea, > 10 times a day, she said usually she will start to feel worsening of abdominal pain and then diarrhea came.  Denies any tenesmus.  Episode of chills but no fever.  Denies any sick contact.  She feels so nauseous unable to swallow pills since yesterday evening.  ED Course: Afebrile, no tachycardia no hypotension.  CT abdomen pelvis showed generalized gas distention of small bowel and colon suspicious for ileus.  WBC 6.9, creatinine 1.19.  Review of Systems: As per HPI otherwise 14 point review of systems negative.    Past Medical History:  Diagnosis Date   Arthritis    Chickenpox    Diabetes mellitus without complication ([place])    one elevated reading/ no treatment   Diverticulitis    GI bleed    High cholesterol    History of blood transfusion    Hypertension    Renal insufficiency     Past Surgical History:  Procedure Laterality Date   APPENDECTOMY     CHOLECYSTECTOMY     ECTOPIC PREGNANCY SURGERY     EYE SURGERY     bilateral cataracts   EYE SURGERY     02/11/2019 repair hole in  right eye    gallbladder      HYSTEROSCOPY WITH D & C N/A 10/26/2016   Procedure: DILATATION AND CURETTAGE /HYSTEROSCOPY;  Surgeon: Benjaman Kindler, MD;  Location: [facility] ORS;  Service: Gynecology;  Laterality: N/A;   HYSTEROSCOPY WITH D & C N/A 07/07/2018   Procedure: DILATATION AND CURETTAGE /HYSTEROSCOPY;  Surgeon: Benjaman Kindler, MD;  Location: [facility] ORS;  Service: Gynecology;  Laterality: N/A;   THYROIDECTOMY, PARTIAL       reports that she has never smoked. She has never used smokeless tobacco. She reports that she does not drink alcohol and does not use drugs.  Allergies  Allergen Reactions   Celexa [Citalopram]     Diarrhea upset stomach    Jardiance [Empagliflozin] Other (See Comments)    Reaction not recalled   Norvasc [Amlodipine]     Leg edema   Tape Other (See Comments)    Leaves the skin "raw" if left on for a period of time- tolerates paper tape   Penicillin V Rash   Penicillins Rash    Has patient had a PCN reaction causing immediate rash, facial/tongue/throat swelling, SOB or lightheadedness with hypotension: Yes Has patient had a PCN reaction causing severe rash involving mucus membranes or skin necrosis: No Has patient had a PCN reaction that required hospitalization No Has patient had a PCN reaction occurring within the last 10 years: Yes If all of the above answers are "  NO", then may proceed with Cephalosporin use.     Family History  Problem Relation Age of Onset   Diabetes Mother    Hypertension Mother    Stroke Mother    Diabetes Other    Healthy Father    Diabetes Sister    Heart disease Sister      Prior to Admission medications   Medication Sig Start Date End Date Taking? Authorizing Provider  acetaminophen (TYLENOL) 500 MG tablet Take 500 mg by mouth every 6 (six) hours as needed for mild pain or headache.   Yes [provider]  Ascorbic Acid (VITAMIN C PO) Take 1 tablet by mouth daily.   Yes [provider]  aspirin EC 81 MG  tablet Take 81 mg by mouth 2 (two) times a week. Swallow whole.   Yes [provider]  cloNIDine (CATAPRES) 0.2 MG tablet Take 1 tablet (0.2 mg total) by mouth 3 (three) times daily. 04/19/21  Yes McLean-Scocuzza, Nino Glow, MD  hydrALAZINE (APRESOLINE) 100 MG tablet Take 1 tablet (100 mg total) by mouth 4 (four) times daily. 05/22/21  Yes Gollan, Kathlene November, MD  insulin NPH Human (NOVOLIN N) 100 UNIT/ML injection INJECT 20 UNITS IN THE MORNING AND THE EVENING WITH A MEAL Patient taking differently: Inject 20 Units into the skin 2 (two) times daily before a meal. 04/10/21  Yes McLean-Scocuzza, Nino Glow, MD  insulin regular (NOVOLIN R) 100 units/mL injection Inject 0-0.08 mLs (0-8 Units total) into the skin 3 (three) times daily before meals. Patient taking differently: Inject 5-15 Units into the skin See admin instructions. Inject between 5 to 15 units into the skin two to three times a day with meals, PER SLIDING SCALE 10/17/20  Yes Renato Shin, MD  LORazepam (ATIVAN) 0.5 MG tablet Take 1 tablet (0.5 mg total) by mouth daily as needed for anxiety. D/c 0.25 04/19/21  Yes McLean-Scocuzza, Nino Glow, MD  Multiple Vitamins-Minerals (CENTRUM SILVER 50+WOMEN) TABS Take 1 tablet by mouth daily with breakfast.   Yes [provider]  polyethylene glycol powder (GLYCOLAX/MIRALAX) 17 GM/SCOOP powder Take 17 g by mouth daily as needed for moderate constipation or severe constipation. Can take up to 2x per day prn. Mix with 8 ounces of liquid 11/10/19  Yes McLean-Scocuzza, Nino Glow, MD  PYRIDOXINE HCL PO Take 1 tablet by mouth daily.   Yes [provider]  rosuvastatin (CRESTOR) 20 MG tablet TAKE 1 TABLET BY MOUTH EVERY DAY Patient taking differently: Take 20 mg by mouth daily. 04/19/21  Yes McLean-Scocuzza, Nino Glow, MD  spironolactone (ALDACTONE) 25 MG tablet Take 1 tablet (25 mg total) by mouth daily. 12/16/20  Yes Regalado, Belkys A, MD  torsemide (DEMADEX) 10 MG tablet Take 1 tablet (10 mg total)  by mouth daily. 07/11/21  Yes McLean-Scocuzza, Nino Glow, MD  ACCU-CHEK GUIDE test strip USE AS INSTRUCTED 3 TIMES DAILY 07/06/21   McLean-Scocuzza, Nino Glow, MD  ammonium lactate (AMLACTIN) 12 % lotion Apply 1 application topically 2 (two) times daily. Apply to feet once daily. Do not apply between toes. Patient not taking: Reported on 08/12/2021 12/26/20   [name], DPM  blood glucose meter kit and supplies KIT Accu chek, Dx code E11.65, check 3 times daily 07/26/17   Leone Haven, MD  Insulin Syringe-Needle U-100 (B-D INS SYR ULTRAFINE .5CC/30G) 30G X 1/2" 0.5 ML MISC 1 Device by Does not apply route daily. Up to 5x per day with insulin bid (NPH) and tid (Regular) 09/26/20  McLean-Scocuzza, Nino Glow, MD  meclizine (ANTIVERT) 12.5 MG tablet Take 1 tablet (12.5 mg total) by mouth daily as needed for dizziness. Patient not taking: Reported on 05/22/2021 09/02/19   McLean-Scocuzza, Nino Glow, MD  mupirocin ointment (BACTROBAN) 2 % Apply 1 application topically 2 (two) times daily. Right index finger and left forearm Patient not taking: Reported on 08/12/2021 12/29/20   McLean-Scocuzza, Nino Glow, MD    Physical Exam: Vitals:   08/12/21 1400 08/12/21 1430 08/12/21 1500 08/12/21 1530  BP: 106/80 (!) 172/63 (!) 187/102 (!) 164/60  Pulse: 89 86 86 89  Resp: '18 14 19 19  ' Temp:      TempSrc:      SpO2: (!) 81% 97% 98% (!) 87%  Weight:      Height:        Constitutional: NAD, calm, comfortable Vitals:   08/12/21 1400 08/12/21 1430 08/12/21 1500 08/12/21 1530  BP: 106/80 (!) 172/63 (!) 187/102 (!) 164/60  Pulse: 89 86 86 89  Resp: '18 14 19 19  ' Temp:      TempSrc:      SpO2: (!) 81% 97% 98% (!) 87%  Weight:      Height:       Eyes: PERRL, lids and conjunctivae normal ENMT: Mucous membranes are dry. Posterior pharynx clear of any exudate or lesions.Normal dentition.  Neck: normal, supple, no masses, no thyromegaly Respiratory: clear to auscultation bilaterally, no wheezing, no crackles.  Normal respiratory effort. No accessory muscle use.  Cardiovascular: Regular rate and rhythm, no murmurs / rubs / gallops. No extremity edema. 2+ pedal pulses. No carotid bruits.  Abdomen: mild tenderness on periumbilical area, no rebound no guarding, no masses palpated. No hepatosplenomegaly.  Increased bowel sounds positive on all 4 quadrants.  Musculoskeletal: no clubbing / cyanosis. No joint deformity upper and lower extremities. Good ROM, no contractures. Normal muscle tone.  Skin: no rashes, lesions, ulcers. No induration Neurologic: CN 2-12 grossly intact. Sensation intact, DTR normal. Strength 5/5 in all 4.  Psychiatric: Normal judgment and insight. Alert and oriented x 3. Normal mood.     Labs on Admission: I have personally reviewed following labs and imaging studies  CBC: Recent Labs  Lab 08/12/21 1009  WBC 6.9  NEUTROABS 3.3  HGB 13.8  HCT 43.1  MCV 90.5  PLT 097   Basic Metabolic Panel: Recent Labs  Lab 08/12/21 1009  NA 133*  K 4.9  CL 94*  CO2 26  GLUCOSE 253*  BUN 15  CREATININE 1.19*  CALCIUM 9.4   GFR: Estimated Creatinine Clearance: 44.2 mL/min (A) (by C-G formula based on SCr of 1.19 mg/dL (H)). Liver Function Tests: Recent Labs  Lab 08/12/21 1009  AST 60*  ALT 34  ALKPHOS 42  BILITOT 2.1*  PROT 7.3  ALBUMIN 3.8   Recent Labs  Lab 08/12/21 1009  LIPASE 27   No results for input(s): AMMONIA in the last 168 hours. Coagulation Profile: No results for input(s): INR, PROTIME in the last 168 hours. Cardiac Enzymes: No results for input(s): CKTOTAL, CKMB, CKMBINDEX, TROPONINI in the last 168 hours. BNP (last 3 results) No results for input(s): PROBNP in the last 8760 hours. HbA1C: No results for input(s): HGBA1C in the last 72 hours. CBG: No results for input(s): GLUCAP in the last 168 hours. Lipid Profile: No results for input(s): CHOL, HDL, LDLCALC, TRIG, CHOLHDL, LDLDIRECT in the last 72 hours. Thyroid Function Tests: No results for  input(s): TSH, T4TOTAL, FREET4, T3FREE, THYROIDAB in the last  72 hours. Anemia Panel: No results for input(s): VITAMINB12, FOLATE, FERRITIN, TIBC, IRON, RETICCTPCT in the last 72 hours. Urine analysis:    Component Value Date/Time   COLORURINE YELLOW 08/12/2021 1003   APPEARANCEUR HAZY (A) 08/12/2021 1003   APPEARANCEUR Clear 10/22/2018 0844   LABSPEC 1.009 08/12/2021 1003   PHURINE 7.0 08/12/2021 1003   GLUCOSEU 150 (A) 08/12/2021 1003   HGBUR SMALL (A) 08/12/2021 1003   BILIRUBINUR NEGATIVE 08/12/2021 1003   BILIRUBINUR Negative 10/22/2018 0844   KETONESUR NEGATIVE 08/12/2021 1003   PROTEINUR 100 (A) 08/12/2021 1003   UROBILINOGEN 0.2 03/09/2020 1022   NITRITE NEGATIVE 08/12/2021 1003   LEUKOCYTESUR TRACE (A) 08/12/2021 1003    Radiological Exams on Admission: CT ABDOMEN PELVIS W CONTRAST  Result Date: 08/12/2021 CLINICAL DATA:  Acute abdominal pain and diarrhea for 3 days. 1 episode of vomiting. EXAM: CT ABDOMEN AND PELVIS WITH CONTRAST TECHNIQUE: Multidetector CT imaging of the abdomen and pelvis was performed using the standard protocol following bolus administration of intravenous contrast. RADIATION DOSE REDUCTION: This exam was performed according to the departmental dose-optimization program which includes automated exposure control, adjustment of the mA and/or kV according to patient size and/or use of iterative reconstruction technique. CONTRAST:  19m OMNIPAQUE IOHEXOL 350 MG/ML SOLN COMPARISON:  12/15/2020 FINDINGS: Lower Chest: No acute findings. Hepatobiliary: No hepatic masses identified. Gallbladder is not visualized, presumably due to prior cholecystectomy. No evidence of biliary ductal dilatation. Pancreas:  No mass or inflammatory changes. Spleen: Within normal limits in size and appearance. Adrenals/Urinary Tract: No masses identified. No evidence of ureteral calculi or hydronephrosis. Stomach/Bowel: Generalized gaseous distention of small bowel and colon is seen,  without evidence of obstruction or focal inflammatory process. No evidence of abscess, free fluid, or free air. Vascular/Lymphatic: No pathologically enlarged lymph nodes. No acute vascular findings. Aortic atherosclerotic calcification noted. Reproductive:  No mass or other significant abnormality. Other:  None. Musculoskeletal:  No suspicious bone lesions identified. IMPRESSION: Generalized gaseous distention of small bowel and colon, suspicious for ileus. No evidence of bowel obstruction, inflammatory process, or abnormal fluid collection. Aortic Atherosclerosis (ICD10-I70.0). Electronically Signed   By: JMarlaine HindM.D.   On: 08/12/2021 13:09    EKG: Independently reviewed.  Sinus, similar nonspecific ST-T changes as before.  Assessment/Plan Principal Problem:   Gastroenteritis  (please populate well all problems here in Problem List. (For example, if patient is on BP meds at home and you resume or decide to hold them, it is a problem that needs to be her. Same for CAD, COPD, HLD and so on)  Acute gastroenteritis -Failed p.o. challenge in ED. -Clinically still appears to be dehydrated, start maintenance IV fluid. -GI pathogen pending, monitor off antibiotics. -CT abdomen pelvis shows ileus.  Abdominal exam benign.  No indication for NG tube as of now.  Ileus -Secondary to acute gastroenteritis, management as above.  HTN, uncontrolled -Secondary to nonadherent with BP meds from her GI symptoms, restart p.o. BP meds  CKD stage II -Received IV contrast, start maintenance IV fluid.  IDDM with hyperglycemia -Lantus 15 units daily plus sliding scale.  Morbid obesity -Calorie control recommended  HLD -Hold statin due to surge of LFTs.  DVT prophylaxis: Heparin subcu Code Status: Full code Family Communication: None at bedside Disposition Plan: Expect less than 2 midnight hospital stay Consults called: None Admission status: MedSurg observation   PLequita HaltMD [facility] Pager [phone] 08/12/2021, 4:41 PM

## 2021-08-12 NOTE — ED Notes (Signed)
Got patient on the monitor patient is resting with nurse at bedside

## 2021-08-12 NOTE — ED Triage Notes (Signed)
Pt BIBA from home. Pt has had diarrhea x 3 days. Pt denies any blood. Pt had one episode of emesis 3 days ago.   160/90 80 HR 256 CBG

## 2021-08-12 NOTE — ED Provider Notes (Signed)
[facility] EMERGENCY DEPARTMENT Provider Note   CSN: [id number] Arrival date & time: 08/12/21  0957     History  Chief Complaint  Patient presents with   Abdominal Pain    Alexis Farmer is a 81 y.o. female.  81 y.o female with a PMH of DM, HTN, Diverticulitis presents to the ED with a chief complaint of abdominal pain and diarrhea x 3 days. Patient states that prior to this episode beginning she had had some cantaloupe, when immediately she felt cramping along her upper abdomen.  Symptoms have continued for the last 3 days, she is continue to have multiple episodes of diarrhea without any blood in her stool.  She also endorses decreased p.o. intake for the last 3 days and only been able to drink water.  Patient did try to have some soup yesterday, reports immediate episode of nonbilious, nonbloody emesis.  Does have a prior history of diverticulitis.  She has not taken any medication for improvement in her symptoms.  Prior history of cholecystectomy, tubal pregnancy.  Denies any recent antibiotic use, fever, urinary symptoms, other complaints.     The history is provided by the patient and medical records.  Abdominal Pain Pain location:  Epigastric, LUQ and RUQ Context: not laxative use, not recent illness, not sick contacts and not suspicious food intake   Associated symptoms: nausea and vomiting   Associated symptoms: no chest pain, no chills, no fever, no shortness of breath and no sore throat   Risk factors: multiple surgeries and obesity       Home Medications Prior to Admission medications   Medication Sig Start Date End Date Taking? Authorizing Provider  ACCU-CHEK GUIDE test strip USE AS INSTRUCTED 3 TIMES DAILY 07/06/21   McLean-Scocuzza, Nino Glow, MD  acetaminophen (TYLENOL) 500 MG tablet Take 500 mg by mouth every 6 (six) hours as needed for mild pain or headache.    [provider]  ammonium lactate (AMLACTIN) 12 % lotion Apply 1 application  topically 2 (two) times daily. Apply to feet once daily. Do not apply between toes. 12/26/20   [name], DPM  Ascorbic Acid (VITAMIN C PO) Take 1 tablet by mouth daily.    [provider]  Ascorbic Acid 500 MG CHEW Chew by mouth.    [provider]  aspirin EC 81 MG tablet Take 81 mg by mouth 2 (two) times a week. Swallow whole.    [provider]  blood glucose meter kit and supplies KIT Accu chek, Dx code E11.65, check 3 times daily 07/26/17   Leone Haven, MD  cloNIDine (CATAPRES) 0.2 MG tablet Take 1 tablet (0.2 mg total) by mouth 3 (three) times daily. 04/19/21   McLean-Scocuzza, Nino Glow, MD  hydrALAZINE (APRESOLINE) 100 MG tablet Take 1 tablet (100 mg total) by mouth 4 (four) times daily. 05/22/21   Minna Merritts, MD  insulin NPH Human (NOVOLIN N) 100 UNIT/ML injection INJECT 20 UNITS IN THE MORNING AND THE EVENING WITH A MEAL 04/10/21   McLean-Scocuzza, Nino Glow, MD  insulin regular (NOVOLIN R) 100 units/mL injection Inject 0-0.08 mLs (0-8 Units total) into the skin 3 (three) times daily before meals. Patient taking differently: Inject 15 Units into the skin See admin instructions. Inject 15 units into the skin two to three times a day with food, PER SLIDING SCALE 10/17/20   Renato Shin, MD  Insulin Syringe-Needle U-100 (B-D INS SYR ULTRAFINE .5CC/30G) 30G X 1/2" 0.5 ML MISC 1 Device  by Does not apply route daily. Up to 5x per day with insulin bid (NPH) and tid (Regular) 09/26/20   McLean-Scocuzza, Nino Glow, MD  LORazepam (ATIVAN) 0.5 MG tablet Take 1 tablet (0.5 mg total) by mouth daily as needed for anxiety. D/c 0.25 04/19/21   McLean-Scocuzza, Nino Glow, MD  LORazepam (ATIVAN) 0.5 MG tablet Take by mouth.    [provider]  meclizine (ANTIVERT) 12.5 MG tablet Take 1 tablet (12.5 mg total) by mouth daily as needed for dizziness. Patient not taking: Reported on 05/22/2021 09/02/19   McLean-Scocuzza, Nino Glow, MD  Multiple Vitamins-Minerals (CENTRUM  SILVER 50+WOMEN) TABS Take 1 tablet by mouth daily with breakfast.    [provider]  mupirocin ointment (BACTROBAN) 2 % Apply 1 application topically 2 (two) times daily. Right index finger and left forearm 12/29/20   McLean-Scocuzza, Nino Glow, MD  polyethylene glycol powder (GLYCOLAX/MIRALAX) 17 GM/SCOOP powder Take 17 g by mouth daily as needed for moderate constipation or severe constipation. Can take up to 2x per day prn. Mix with 8 ounces of liquid 11/10/19   McLean-Scocuzza, Nino Glow, MD  PYRIDOXINE HCL PO Take by mouth.    [provider]  rosuvastatin (CRESTOR) 20 MG tablet TAKE 1 TABLET BY MOUTH EVERY DAY 04/19/21   McLean-Scocuzza, Nino Glow, MD  spironolactone (ALDACTONE) 25 MG tablet Take 1 tablet (25 mg total) by mouth daily. 12/16/20   Regalado, Belkys A, MD  torsemide (DEMADEX) 10 MG tablet Take 1 tablet (10 mg total) by mouth daily. 07/11/21   McLean-Scocuzza, Nino Glow, MD      Allergies    Celexa [citalopram], Jardiance [empagliflozin], Norvasc [amlodipine], Tape, Penicillin v, and Penicillins    Review of Systems   Review of Systems  Constitutional:  Negative for chills and fever.  HENT:  Negative for sore throat.   Respiratory:  Negative for shortness of breath.   Cardiovascular:  Negative for chest pain.  Gastrointestinal:  Positive for abdominal pain, nausea and vomiting.  Genitourinary:  Negative for flank pain.  Neurological:  Negative for light-headedness and headaches.  All other systems reviewed and are negative.  Physical Exam Updated Vital Signs BP 106/80    Pulse 89    Temp 98.6 F (37 C) (Oral)    Resp 18    Ht '5\' 3"'  (1.6 m)    Wt 113.4 kg    SpO2 (!) 81%    BMI 44.29 kg/m  Physical Exam Vitals and nursing note reviewed.  Constitutional:      Appearance: She is well-developed. She is ill-appearing.  HENT:     Head: Normocephalic and atraumatic.  Cardiovascular:     Rate and Rhythm: Normal rate.     Heart sounds: Normal heart sounds.   Pulmonary:     Effort: Pulmonary effort is normal.     Breath sounds: No wheezing or rales.  Abdominal:     General: Bowel sounds are decreased. There is distension.     Palpations: Abdomen is soft.     Tenderness: There is generalized abdominal tenderness and tenderness in the right upper quadrant, epigastric area and left upper quadrant. There is no right CVA tenderness or left CVA tenderness.  Skin:    General: Skin is warm and dry.  Neurological:     Mental Status: She is alert and oriented to person, place, and time.    ED Results / Procedures / Treatments   Labs (all labs ordered are listed, but only abnormal results are displayed) Labs  Reviewed  COMPREHENSIVE METABOLIC PANEL - Abnormal; Notable for the following components:      Result Value   Sodium 133 (*)    Chloride 94 (*)    Glucose, Bld 253 (*)    Creatinine, Ser 1.19 (*)    AST 60 (*)    Total Bilirubin 2.1 (*)    GFR, Estimated 46 (*)    All other components within normal limits  URINALYSIS, ROUTINE W REFLEX MICROSCOPIC - Abnormal; Notable for the following components:   APPearance HAZY (*)    Glucose, UA 150 (*)    Hgb urine dipstick SMALL (*)    Protein, ur 100 (*)    Leukocytes,Ua TRACE (*)    Bacteria, UA RARE (*)    All other components within normal limits  C DIFFICILE QUICK SCREEN W PCR REFLEX    GASTROINTESTINAL PANEL BY PCR, STOOL (REPLACES STOOL CULTURE)  RESP PANEL BY RT-PCR (FLU A&B, COVID) ARPGX2  CBC WITH DIFFERENTIAL/PLATELET  LIPASE, BLOOD    EKG EKG Interpretation  Date/Time:  Saturday August 12 2021 13:51:55 EST Ventricular Rate:  91 PR Interval:  240 QRS Duration: 107 QT Interval:  396 QTC Calculation: 488 R Axis:   200 Text Interpretation: Sinus rhythm Prolonged PR interval Anteroseptal infarct, age indeterminate Confirmed by Lennice Sites [phone]) on 08/12/2021 1:57:21 PM  Radiology CT ABDOMEN PELVIS W CONTRAST  Result Date: 08/12/2021 CLINICAL DATA:  Acute abdominal pain  and diarrhea for 3 days. 1 episode of vomiting. EXAM: CT ABDOMEN AND PELVIS WITH CONTRAST TECHNIQUE: Multidetector CT imaging of the abdomen and pelvis was performed using the standard protocol following bolus administration of intravenous contrast. RADIATION DOSE REDUCTION: This exam was performed according to the departmental dose-optimization program which includes automated exposure control, adjustment of the mA and/or kV according to patient size and/or use of iterative reconstruction technique. CONTRAST:  20m OMNIPAQUE IOHEXOL 350 MG/ML SOLN COMPARISON:  12/15/2020 FINDINGS: Lower Chest: No acute findings. Hepatobiliary: No hepatic masses identified. Gallbladder is not visualized, presumably due to prior cholecystectomy. No evidence of biliary ductal dilatation. Pancreas:  No mass or inflammatory changes. Spleen: Within normal limits in size and appearance. Adrenals/Urinary Tract: No masses identified. No evidence of ureteral calculi or hydronephrosis. Stomach/Bowel: Generalized gaseous distention of small bowel and colon is seen, without evidence of obstruction or focal inflammatory process. No evidence of abscess, free fluid, or free air. Vascular/Lymphatic: No pathologically enlarged lymph nodes. No acute vascular findings. Aortic atherosclerotic calcification noted. Reproductive:  No mass or other significant abnormality. Other:  None. Musculoskeletal:  No suspicious bone lesions identified. IMPRESSION: Generalized gaseous distention of small bowel and colon, suspicious for ileus. No evidence of bowel obstruction, inflammatory process, or abnormal fluid collection. Aortic Atherosclerosis (ICD10-I70.0). Electronically Signed   By: JMarlaine HindM.D.   On: 08/12/2021 13:09    Procedures Procedures    Medications Ordered in ED Medications  sodium chloride 0.9 % bolus 500 mL (0 mLs Intravenous Stopped 08/12/21 1245)  fentaNYL (SUBLIMAZE) injection 50 mcg (50 mcg Intravenous Given 08/12/21 1130)   ondansetron (ZOFRAN) injection 4 mg (4 mg Intravenous Given 08/12/21 1130)  morphine (PF) 4 MG/ML injection 4 mg (4 mg Intravenous Given 08/12/21 1216)  iohexol (OMNIPAQUE) 350 MG/ML injection 75 mL (75 mLs Intravenous Contrast Given 08/12/21 1249)  morphine (PF) 4 MG/ML injection 4 mg (4 mg Intravenous Given 08/12/21 1346)    ED Course/ Medical Decision Making/ A&P Clinical Course as of 08/12/21 1453  Sat Aug 12, 2021  1Bray!): TRACE [  JS]    Clinical Course User Index [JS] Janeece Fitting, PA-C                           Medical Decision Making Amount and/or Complexity of Data Reviewed Labs: ordered. Decision-making details documented in ED Course. Radiology: ordered.  Risk Prescription drug management.  This patient presents to the ED for concern of abdominal pain, diarrhea, this involves a number of treatment options, and is a complaint that carries with it a high risk of complications and morbidity.  The differential diagnosis includes diverticulitis, partial obstruction, infectious process.    Co morbidities: Discussed in HPI   Brief History:  Patient with a past medical history of diverticulitis, multiple surgeries to the abdomen presents to the ED with abdominal pain, diarrhea that has been ongoing for the past 3 days.  Decrease in oral intake, medication has been taking for improvement in symptoms.  Denies any urinary symptoms, or fevers.  EMR reviewed including pt PMHx, past surgical history and past visits to ER.   See HPI for more details   Lab Tests:  I ordered and independently interpreted labs.  The pertinent results include:    I personally reviewed all laboratory work and imaging. Metabolic panel without any acute abnormality specifically kidney function within normal limits and no significant electrolyte abnormalities. CBC without leukocytosis or significant anemia.  AST is 60, denies any prior history of alcohol abuse.  UA with trace of leukocytes,  rare bacteria, denying any urinary symptoms at this time.   Imaging Studies:  CT Abdomen and pelvis:  Generalized gaseous distention of small bowel and colon, suspicious  for ileus. No evidence of bowel obstruction, inflammatory process,  or abnormal fluid collection.     Aortic Atherosclerosis (ICD10-I70.0).        Cardiac Monitoring:  The patient was maintained on a cardiac monitor.  I personally viewed and interpreted the cardiac monitored which showed an underlying rhythm of: NSR EKG non-ischemic   Medicines ordered:  I ordered medication including zofran, fentanyl, fluids for symptomatic control Reevaluation of the patient after these medicines showed that the patient improved I have reviewed the patients home medicines and have made adjustments as needed  A second round for medication was ordered for patient as patient obtained no relief after 4 mg of morphine.  She continues to have episodes of diarrhea while in the ED.   Reevaluation:  After the interventions noted above I re-evaluated patient and found that they have :stayed the same   Social Determinants of Health:  The patient's social determinants of health were a factor in the care of this patient    Problem List / ED Course:  Patient presents to the ED with diarrhea for the past 3 days, worsening symptoms including generalized abdominal pain worsening to the epigastric region.  Decrease in oral intake.   Dispostion:  After consideration of the diagnostic results and the patients response to treatment, I feel that the patent would benefit from inpatient management of her ongoing diarrhea along with uncontrollable abdominal pain.  Also with CT with perhaps ileus present will need close monitoring.  2:53 PM spoke to Dr. Roosevelt Locks hospitalist service, appreciate his assistance who will admit patient for further management.   Portions of this note were generated with Lobbyist. Dictation  errors may occur despite best attempts at proofreading.   Final Clinical Impression(s) / ED Diagnoses Final diagnoses:  Generalized abdominal pain  Diarrhea,  unspecified type    Rx / DC Orders ED Discharge Orders     None         Janeece Fitting, PA-C 08/12/21 [place], [place], DO 08/12/21 1554

## 2021-08-12 NOTE — ED Notes (Signed)
Called and LM on grandaughter, Rasheeda's phone.

## 2021-08-13 ENCOUNTER — Observation Stay (HOSPITAL_COMMUNITY): Payer: Medicare Other

## 2021-08-13 DIAGNOSIS — K529 Noninfective gastroenteritis and colitis, unspecified: Secondary | ICD-10-CM | POA: Diagnosis present

## 2021-08-13 DIAGNOSIS — E7800 Pure hypercholesterolemia, unspecified: Secondary | ICD-10-CM | POA: Diagnosis present

## 2021-08-13 DIAGNOSIS — M1990 Unspecified osteoarthritis, unspecified site: Secondary | ICD-10-CM | POA: Diagnosis present

## 2021-08-13 DIAGNOSIS — Z833 Family history of diabetes mellitus: Secondary | ICD-10-CM | POA: Diagnosis not present

## 2021-08-13 DIAGNOSIS — Z7982 Long term (current) use of aspirin: Secondary | ICD-10-CM | POA: Diagnosis not present

## 2021-08-13 DIAGNOSIS — Z794 Long term (current) use of insulin: Secondary | ICD-10-CM | POA: Diagnosis not present

## 2021-08-13 DIAGNOSIS — Z823 Family history of stroke: Secondary | ICD-10-CM | POA: Diagnosis not present

## 2021-08-13 DIAGNOSIS — R109 Unspecified abdominal pain: Secondary | ICD-10-CM | POA: Diagnosis present

## 2021-08-13 DIAGNOSIS — Z888 Allergy status to other drugs, medicaments and biological substances status: Secondary | ICD-10-CM | POA: Diagnosis not present

## 2021-08-13 DIAGNOSIS — I129 Hypertensive chronic kidney disease with stage 1 through stage 4 chronic kidney disease, or unspecified chronic kidney disease: Secondary | ICD-10-CM | POA: Diagnosis present

## 2021-08-13 DIAGNOSIS — E1165 Type 2 diabetes mellitus with hyperglycemia: Secondary | ICD-10-CM | POA: Diagnosis present

## 2021-08-13 DIAGNOSIS — Z8249 Family history of ischemic heart disease and other diseases of the circulatory system: Secondary | ICD-10-CM | POA: Diagnosis not present

## 2021-08-13 DIAGNOSIS — E1122 Type 2 diabetes mellitus with diabetic chronic kidney disease: Secondary | ICD-10-CM | POA: Diagnosis present

## 2021-08-13 DIAGNOSIS — Z79899 Other long term (current) drug therapy: Secondary | ICD-10-CM | POA: Diagnosis not present

## 2021-08-13 DIAGNOSIS — Z20822 Contact with and (suspected) exposure to covid-19: Secondary | ICD-10-CM | POA: Diagnosis present

## 2021-08-13 DIAGNOSIS — Z6841 Body Mass Index (BMI) 40.0 and over, adult: Secondary | ICD-10-CM | POA: Diagnosis not present

## 2021-08-13 DIAGNOSIS — N182 Chronic kidney disease, stage 2 (mild): Secondary | ICD-10-CM | POA: Diagnosis present

## 2021-08-13 DIAGNOSIS — Z880 Allergy status to penicillin: Secondary | ICD-10-CM | POA: Diagnosis not present

## 2021-08-13 DIAGNOSIS — K567 Ileus, unspecified: Secondary | ICD-10-CM | POA: Diagnosis present

## 2021-08-13 LAB — GLUCOSE, CAPILLARY
Glucose-Capillary: 234 mg/dL — ABNORMAL HIGH (ref 70–99)
Glucose-Capillary: 315 mg/dL — ABNORMAL HIGH (ref 70–99)
Glucose-Capillary: 200 mg/dL — ABNORMAL HIGH (ref 70–99)
Glucose-Capillary: 200 mg/dL — ABNORMAL HIGH (ref 70–99)
Glucose-Capillary: 189 mg/dL — ABNORMAL HIGH (ref 70–99)
Glucose-Capillary: 163 mg/dL — ABNORMAL HIGH (ref 70–99)

## 2021-08-13 LAB — HEMOGLOBIN A1C
Hgb A1c MFr Bld: 8.8 % — ABNORMAL HIGH (ref 4.8–5.6)
Mean Plasma Glucose: 205.86 mg/dL

## 2021-08-13 LAB — CBC
HCT: 36.8 % (ref 36.0–46.0)
Hemoglobin: 11.5 g/dL — ABNORMAL LOW (ref 12.0–15.0)
MCH: 28.7 pg (ref 26.0–34.0)
MCHC: 31.3 g/dL (ref 30.0–36.0)
MCV: 91.8 fL (ref 80.0–100.0)
Platelets: 212 10*3/uL (ref 150–400)
RBC: 4.01 MIL/uL (ref 3.87–5.11)
RDW: 13.6 % (ref 11.5–15.5)
WBC: 6.5 10*3/uL (ref 4.0–10.5)
nRBC: 0 % (ref 0.0–0.2)

## 2021-08-13 LAB — COMPREHENSIVE METABOLIC PANEL
ALT: 24 U/L (ref 0–44)
AST: 26 U/L (ref 15–41)
Albumin: 3 g/dL — ABNORMAL LOW (ref 3.5–5.0)
Alkaline Phosphatase: 38 U/L (ref 38–126)
Anion gap: 7 (ref 5–15)
BUN: 10 mg/dL (ref 8–23)
CO2: 29 mmol/L (ref 22–32)
Calcium: 8.4 mg/dL — ABNORMAL LOW (ref 8.9–10.3)
Chloride: 100 mmol/L (ref 98–111)
Creatinine, Ser: 1.21 mg/dL — ABNORMAL HIGH (ref 0.44–1.00)
GFR, Estimated: 45 mL/min — ABNORMAL LOW (ref >60)
Glucose, Bld: 268 mg/dL — ABNORMAL HIGH (ref 70–99)
Potassium: 3.6 mmol/L (ref 3.5–5.1)
Sodium: 136 mmol/L (ref 135–145)
Total Bilirubin: 0.6 mg/dL (ref 0.3–1.2)
Total Protein: 6 g/dL — ABNORMAL LOW (ref 6.5–8.1)

## 2021-08-13 MED ORDER — POLYETHYLENE GLYCOL 3350 17 G PO PACK
17.0000 g | PACK | Freq: Every day | ORAL | Status: DC
  Administered 2021-08-13 – 2021-08-14 (×2): 17 g via ORAL
  Filled 2021-08-13 (×3): qty 1

## 2021-08-13 MED ORDER — SODIUM CHLORIDE 0.9 % IV SOLN: INTRAVENOUS | Status: AC

## 2021-08-13 MED ORDER — OXYCODONE HCL 5 MG/5ML PO SOLN
5.0000 mg | Freq: Four times a day (QID) | ORAL | Status: DC | PRN
  Administered 2021-08-13 (×2): 5 mg via ORAL
  Filled 2021-08-13 (×3): qty 5

## 2021-08-13 NOTE — Progress Notes (Signed)
PT Cancellation Note  Patient Details Name: Alexis Farmer MRN: [id number] DOB: 07-Jan-1941   Cancelled Treatment:    Reason Eval/Treat Not Completed: Pain limiting ability to participate. Pt declines PT eval at this time due to abdominal pain despite PT encouragement. PT will follow up tomorrow morning.   Zenaida Niece 08/13/2021, 2:03 PM

## 2021-08-13 NOTE — Progress Notes (Addendum)
°  Progress Note   Patient: Alexis Farmer OEU:[id number] DOB: 07-Mar-1941 DOA: 08/12/2021     0 DOS: the patient was seen and examined on 08/13/2021   Brief hospital course: 81 year old female with history of CKD stage II, type 2 diabetes, morbid obesity, hypertension she was admitted with nausea vomiting diarrhea and abdominal pain. She tells me today that she has not moved her bowels for the last 24 hours but continues to have abdominal pain CT of the abdomen and pelvis shows that she has ileus. UA positive for leukocytes negative nitrites.  Assessment and Plan:  #1 ileus-patient admitted with abdominal pain diarrhea nausea and vomiting.  She has not had a bowel movement in the last 24 hours.  However she is still on clear liquids.  CT abdomen shows ileus distended loops of bowel in the colon and small bowel. We will advance diet as tolerated. Minimize narcotics PT consult Patient walks with a walker at home and she lives alone. KUB today  stool studies were not done as patient did not have any loose BMs  #2 hypertension-blood pressure 142/58 continue Catapres and hydralazine.  #3 stage II CKD-creatinine is 1.21 from 1.19 yesterday.  She received contrast for CT abdomen.  We will continue slow IV hydration. Hold Demadex and Aldactone for now  #4 type 2 diabetes hemoglobin A1c is 8.8.  Continue Semglee. CBG (last 3)  Recent Labs    08/12/21 1833 08/12/21 2102 08/13/21 0822  GLUCAP 232* 242* 234*    #5 morbid obesity BMI 44.29.  Complicates overall care and prognosis.  #6 hyperlipidemia patents on hold due to elevated LFTs   Subjective: Patient is resting in bed trying to eat Jell-O complains of abdominal pain denies vomiting No BMs for the last 24 hours She lives alone [place] with a walker at home  Physical Exam: Vitals:   08/12/21 2002 08/13/21 0023 08/13/21 0512 08/13/21 0819  BP: (!) 181/54 (!) 134/51 (!) 123/48 (!) 142/58  Pulse: 86 63 (!) 58 63  Resp: 18 17 20 18    Temp: 99.5 F (37.5 C) 98.6 F (37 C) 98.7 F (37.1 C) 98.5 F (36.9 C)  TempSrc: Oral  Oral Oral  SpO2: 96% 99% 100% 100%  Weight:      Height:       Morbidly obese female resting in bed no acute distress Oral mucosa dry Chest decreased breath sounds at the bases Cardiac regular rate and rhythm Abdomen soft distended tender in the upper abdomen no rebound or guarding bowel sounds present Extremities 2+ bilateral pitting edema chronic  Data Reviewed: CT abdomen with ileus Family Communication: Discussed with granddaughter at bedside multiple times  Disposition: Status is: Observation The patient remains OBS appropriate and will d/c before 2 midnights.  DVT prophylaxis Lovenox      Planned Discharge Destination: Home with Home Health   Time spent: 45 minutes  Author: Georgette Shell, MD 08/13/2021 11:14 AM  For on call review [email or web address].

## 2021-08-13 NOTE — Progress Notes (Signed)
Patient c/o numbness in her hands.When assessed she has good grip and +2radial pulse in both hands. She states that its like this if her blood sugar is dropping. Writer checked CBG, it was 163. Patient stated that it should be >200 at night. Educated patient but patient stated "you don't know my body". Patient asked for Orange juice -was given just in case blood sugar drops.   Call bell within reach and will continue to close monitor patient.

## 2021-08-13 NOTE — TOC Initial Note (Signed)
Transition of Care [facility]) - Initial/Assessment Note    Patient Details  Name: Alexis Farmer MRN: [id number] Date of Birth: [birth year]  [facility] CM/SW Contact:    Bartholomew Crews, RN Phone Number: [phone] 08/13/2021, 4:03 PM  Clinical Narrative:                  Received call back from patient on her cell phone. PTA home alone. Ambulates with walker. No HH services at this time. States her family checks in on her. TOC following for transition needs.   Expected Discharge Plan: Home/Self Care Barriers to Discharge: Continued Medical Work up   Patient Goals and CMS Choice Patient states their goals for this hospitalization and ongoing recovery are:: return to her home CMS Medicare.gov Compare Post Acute Care list provided to:: Patient Choice offered to / list presented to : NA  Expected Discharge Plan and Services Expected Discharge Plan: Home/Self Care   Discharge Planning Services: CM Consult                                          Prior Living Arrangements/Services   Lives with:: Self Patient language and need for interpreter reviewed:: Yes Do you feel safe going back to the place where you live?: Yes      Need for Family Participation in Patient Care: No (Comment)   Current home services: DME (walker) Criminal Activity/Legal Involvement Pertinent to Current Situation/Hospitalization: No - Comment as needed  Activities of Daily Living      Permission Sought/Granted                  Emotional Assessment Appearance:: Appears stated age Attitude/Demeanor/Rapport: Engaged Affect (typically observed): Accepting Orientation: : Oriented to Self, Oriented to Place, Oriented to  Time, Oriented to Situation   Psych Involvement: No (comment)  Admission diagnosis:  Gastroenteritis [K52.9] Generalized abdominal pain [R10.84] Diarrhea, unspecified type [R19.7] Patient Active Problem List   Diagnosis Date Noted   Gastroenteritis  08/12/2021   Lymphedema 04/19/2021   Atherosclerosis of aorta ([place]) 01/16/2021   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/16/2021   Spinal stenosis of lumbar region 01/16/2021   Stage 3a chronic kidney disease ([place]) 01/15/2021   Atypical chest pain 01/15/2021   Panic attack 01/15/2021   Hypertensive urgency 12/15/2020   Palpitations 12/15/2020   Elevated troponin 12/15/2020   Other chronic pain 08/25/2020   Hypertension associated with diabetes ([place]) 08/25/2020   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   Pure hypercholesterolemia 08/10/2020   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 ([place]) 05/30/2020   Retinopathy 05/30/2020   Primary hypertension 03/25/2020   Multinodular goiter 02/04/2020   Peripheral edema 12/22/2019   Proteinuria 12/22/2019   Constipation 11/17/2019   Recurrent falls 08/13/2019   Physical deconditioning 08/13/2019   Abdominal aortic atherosclerosis ([place]) 07/24/2019   Morbid obesity with BMI of 45.0-49.9, adult ([place]) 06/09/2019   Hypercalcemia 03/18/2019   Round hole, unspecified eye 02/06/2019   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   Disorder of rotator cuff 06/24/2018   Abnormal gait 03/26/2018   Fall 03/26/2018   Arthritis 03/26/2018   Thyroid nodule 01/20/2018   Dysuria 03/22/2017   Cervical spondylosis with radiculopathy 01/26/2017   Radiculopathy due to cervical spondylosis 01/26/2017   Rectal hemorrhage 07/29/2016   Carotid artery stenosis 07/19/2016   PAD (peripheral artery disease) ([place]) 07/19/2016  Swelling of lower leg 07/19/2016   Bradycardia 07/17/2016   Postmenopausal bleeding 07/17/2016   Chronic diastolic CHF (congestive heart failure) ([place]) 05/31/2016   Cough 01/25/2016   Chronic fatigue 11/15/2015   Depression, recurrent ([place]) 10/13/2015   Osteoarthritis 10/13/2015   Vertigo 10/13/2015   Anxiety 09/13/2015   Gastrointestinal hemorrhage 08/03/2015   Hyperglycemia due to type 2  diabetes mellitus ([place]) 03/11/2015   Mixed hyperlipidemia 08/26/2014   Essential hypertension 04/08/2014   Diabetes mellitus type II, uncontrolled 04/08/2014   Chronic kidney disease, stage 2 (mild) 04/08/2014   Edema of foot 04/08/2014   Morbid obesity ([place]) 04/08/2014   Low back pain 04/08/2014   Diabetic polyneuropathy ([place]) 04/08/2014   Type 2 diabetes mellitus with other diabetic kidney complication ([place]) 96/43/8381   PCP:  McLean-Scocuzza, Nino Glow, MD Pharmacy:   [id number] - [place], [place] [place] [address] Phone: [phone] Fax: [phone]     Social Determinants of Health (SDOH) Interventions    Readmission Risk Interventions No flowsheet data found.

## 2021-08-14 ENCOUNTER — Inpatient Hospital Stay (HOSPITAL_COMMUNITY): Payer: Medicare Other

## 2021-08-14 ENCOUNTER — Telehealth: Payer: Medicare Other

## 2021-08-14 ENCOUNTER — Telehealth: Payer: Self-pay | Admitting: *Deleted

## 2021-08-14 DIAGNOSIS — K529 Noninfective gastroenteritis and colitis, unspecified: Secondary | ICD-10-CM | POA: Diagnosis not present

## 2021-08-14 LAB — COMPREHENSIVE METABOLIC PANEL
ALT: 25 U/L (ref 0–44)
AST: 31 U/L (ref 15–41)
Albumin: 3.2 g/dL — ABNORMAL LOW (ref 3.5–5.0)
Alkaline Phosphatase: 39 U/L (ref 38–126)
Anion gap: 9 (ref 5–15)
BUN: 12 mg/dL (ref 8–23)
CO2: 26 mmol/L (ref 22–32)
Calcium: 8.5 mg/dL — ABNORMAL LOW (ref 8.9–10.3)
Chloride: 101 mmol/L (ref 98–111)
Creatinine, Ser: 1.27 mg/dL — ABNORMAL HIGH (ref 0.44–1.00)
GFR, Estimated: 42 mL/min — ABNORMAL LOW (ref >60)
Glucose, Bld: 261 mg/dL — ABNORMAL HIGH (ref 70–99)
Potassium: 3.9 mmol/L (ref 3.5–5.1)
Sodium: 136 mmol/L (ref 135–145)
Total Bilirubin: 0.7 mg/dL (ref 0.3–1.2)
Total Protein: 6.4 g/dL — ABNORMAL LOW (ref 6.5–8.1)

## 2021-08-14 LAB — GLUCOSE, CAPILLARY
Glucose-Capillary: 217 mg/dL — ABNORMAL HIGH (ref 70–99)
Glucose-Capillary: 250 mg/dL — ABNORMAL HIGH (ref 70–99)
Glucose-Capillary: 197 mg/dL — ABNORMAL HIGH (ref 70–99)
Glucose-Capillary: 264 mg/dL — ABNORMAL HIGH (ref 70–99)
Glucose-Capillary: 287 mg/dL — ABNORMAL HIGH (ref 70–99)

## 2021-08-14 LAB — CBC
HCT: 38.6 % (ref 36.0–46.0)
Hemoglobin: 12.2 g/dL (ref 12.0–15.0)
MCH: 28.6 pg (ref 26.0–34.0)
MCHC: 31.6 g/dL (ref 30.0–36.0)
MCV: 90.4 fL (ref 80.0–100.0)
Platelets: 205 10*3/uL (ref 150–400)
RBC: 4.27 MIL/uL (ref 3.87–5.11)
RDW: 13.4 % (ref 11.5–15.5)
WBC: 6.8 10*3/uL (ref 4.0–10.5)
nRBC: 0 % (ref 0.0–0.2)

## 2021-08-14 MED ORDER — BISACODYL 10 MG RE SUPP
10.0000 mg | Freq: Once | RECTAL | Status: AC
  Administered 2021-08-14: 10 mg via RECTAL
  Filled 2021-08-14: qty 1

## 2021-08-14 MED ORDER — POLYETHYLENE GLYCOL 3350 17 G PO PACK
32.0000 g | PACK | Freq: Once | ORAL | Status: AC
  Administered 2021-08-14: 32 g via ORAL
  Filled 2021-08-14: qty 2

## 2021-08-14 NOTE — Telephone Encounter (Signed)
°  Care Management   Follow Up Note   08/14/2021 Name: Alexis Farmer MRN: [id number] DOB: [birth year]   Referred by: McLean-Scocuzza, Nino Glow, MD Reason for referral : Chronic Care Management (DM, HTN)   Per chart review, patient admitted to [facility] for SBO.  Will monitor for discharge.  Follow Up Plan: The care management team will reach out to the patient again over the next 10 days.  To verify disposition.  [name] RN, MSN RN Care Management Coordinator [place] [phone] [email or web address]

## 2021-08-14 NOTE — Progress Notes (Addendum)
Inpatient Diabetes Program Recommendations  AACE/ADA: New Consensus Statement on Inpatient Glycemic Control (2015)  Target Ranges:  Prepandial:   less than 140 mg/dL      Peak postprandial:   less than 180 mg/dL (1-2 hours)      Critically ill patients:  140 - 180 mg/dL   Lab Results  Component Value Date   GLUCAP 250 (H) 08/14/2021   HGBA1C 8.8 (H) 08/13/2021    Review of Glycemic Control  Latest Reference Range & Units 08/13/21 18:28 08/13/21 18:49 08/13/21 20:42 08/13/21 23:09 08/14/21 07:52 08/14/21 09:33  Glucose-Capillary 70 - 99 mg/dL 200 (H) 200 (H) 189 (H) 163 (H) 217 (H) 250 (H)   Diabetes history: DM 2 Outpatient Diabetes medications:  NPH 20 units bid Novolin R 5-15 units tid with meals  Current orders for Inpatient glycemic control:  Novolog resistant tid with meals and HS Semglee 15 units daily  Inpatient Diabetes Program Recommendations:    Consider increasing Semglee 25 units daily.  Also consider adding Novolog 4 units tid with meals (hold if patient eats less than 50% or NPO).   Thanks,  Adah Perl, RN, BC-ADM Inpatient Diabetes Coordinator Pager [phone]  (8a-5p)

## 2021-08-14 NOTE — Progress Notes (Signed)
°  Progress Note   Patient: Alexis Farmer CNO:[id number] DOB: 1940-08-21 DOA: 08/12/2021     1 DOS: the patient was seen and examined on 08/14/2021   Brief hospital course: 81 year old female with history of CKD stage II, type 2 diabetes, morbid obesity, hypertension she was admitted with nausea vomiting diarrhea and abdominal pain. Assessment and Plan:  #1 ileus-patient admitted with abdominal pain diarrhea nausea and vomiting.   She has not had a bowel movement in the last 24 hours.  She is used to taking MiraLAX 3 caps daily at 1 time.  2 more doses of MiraLAX today.  Advance diet as tolerated.   CT abdomen shows ileus distended loops of bowel in the colon and small bowel. Minimize narcotics. KUB today shows improvement but still with ileus.  #2 hypertension-blood pressure 173/50  continue Catapres and hydralazine.  #3 stage II CKD-creatinine is 1.27 from 1.21 from 1.19 yesterday.  She received contrast for CT abdomen.  We will continue slow IV hydration. Hold Demadex and Aldactone for now  #4 type 2 diabetes hemoglobin A1c is 8.8.  Continue Semglee and increase the dose. CBG (last 3)  Recent Labs    08/14/21 0752 08/14/21 0933 08/14/21 1234  GLUCAP 217* 250* 197*     #5 morbid obesity BMI 44.29.  Complicates overall care and prognosis.  #6 hyperlipidemia patents on hold due to elevated LFTs   Subjective:  Patient is sitting up in bed in no acute distress She has 2+ bilateral pitting edema she denies shortness of breath continues to complain of abdominal pain KUB from this morning still with ileus Physical Exam: Vitals:   08/13/21 1939 08/13/21 2157 08/14/21 0621 08/14/21 0931  BP: (!) 130/44 (!) 131/47 (!) 177/62 (!) 173/50  Pulse: 61 62 72 81  Resp: 18  20 19   Temp: 98.6 F (37 C)  98.5 F (36.9 C) 99 F (37.2 C)  TempSrc:    Oral  SpO2: 97%  95% 98%  Weight:      Height:       Morbidly obese female resting in bed no acute distress Oral mucosa dry Chest  decreased breath sounds at the bases Cardiac regular rate and rhythm Abdomen soft distended tender in the upper abdomen no rebound or guarding bowel sounds present Extremities 2+ bilateral pitting edema chronic  Data Reviewed: CT abdomen with ileus Family Communication: Discussed with granddaughter at bedside multiple times  Disposition: Status is: Inpatient  DVT prophylaxis Lovenox      Planned Discharge Destination: Home with Home Health   Time spent: 45 minutes  Author: Georgette Shell, MD 08/14/2021 1:47 PM  For on call review [email or web address].

## 2021-08-14 NOTE — Evaluation (Signed)
Physical Therapy Evaluation Patient Details Name: Alexis Farmer MRN: [id number] DOB: 30-Oct-1940 Today's Date: 08/14/2021  History of Present Illness  81 y.o. female [facility] hospital on 2.25.[facility]23 with nausea, vomiting, diarrhea, and abdominal pain. CT abdomen demonstrates ileus. PMH includes CKD stage II, type 2 diabetes, morbid obesity, hypertension.  Clinical Impression   Pt presents with generalized weakness, chronic LE pain with acute abdominal discomfort secondary to ileus, increased time and effort to mobilize, and decreased activity tolerance vs baseline. Pt to benefit from acute PT to address deficits. Pt ambulated hallway distance x2, requiring seated rest break to recover fatigue with Hrmax observed 117 bpm. Pt lives alone with as needed assist from family members, recommending HHPT to maximize safety in apartment and improve pt strength and tolerance for mobility. PT to progress mobility as tolerated, and will continue to follow acutely.         Recommendations for follow up therapy are one component of a multi-disciplinary discharge planning process, led by the attending physician.  Recommendations may be updated based on patient status, additional functional criteria and insurance authorization.  Follow Up Recommendations Home health PT    Assistance Recommended at Discharge PRN  Patient can return home with the following  Assist for transportation;Help with stairs or ramp for entrance    Equipment Recommendations None recommended by PT  Recommendations for Other Services  OT consult    Functional Status Assessment Patient has had a recent decline in their functional status and demonstrates the ability to make significant improvements in function in a reasonable and predictable amount of time.     Precautions / Restrictions Precautions Precautions: Fall Restrictions Weight Bearing Restrictions: No      Mobility  Bed Mobility Overal bed mobility: Needs  Assistance             General bed mobility comments: up in chair    Transfers Overall transfer level: Needs assistance Equipment used: Rolling walker (2 wheels) Transfers: Sit to/from Stand Sit to Stand: Min guard           General transfer comment: for safety, slow to rise and sit    Ambulation/Gait Ambulation/Gait assistance: Min guard Gait Distance (Feet): 60 Feet (x2 - seated rest break) Assistive device: Rolling walker (2 wheels) Gait Pattern/deviations: Step-through pattern, Decreased stride length, Trunk flexed Gait velocity: decr     General Gait Details: cues for upright posture, placement in RW.  Stairs            Wheelchair Mobility    Modified Rankin (Stroke Patients Only)       Balance Overall balance assessment: Needs assistance, History of Falls Sitting-balance support: No upper extremity supported, Feet supported Sitting balance-Leahy Scale: Fair     Standing balance support: Bilateral upper extremity supported, During functional activity Standing balance-Leahy Scale: Fair                               Pertinent Vitals/Pain Pain Assessment Pain Assessment: Faces Faces Pain Scale: Hurts a little bit Pain Location: abdomen, LEs (chronic) Pain Descriptors / Indicators: Sore Pain Intervention(s): Limited activity within patient's tolerance, Monitored during session, Repositioned    Home Living Family/patient expects to be discharged to:: Private residence Living Arrangements: Alone   Type of Home: Apartment Home Access: Stairs to enter   [employer] of Steps: 1 (cement step, has family or transport to help)   Home Layout: One level Home Equipment: Rolling  Walker (2 wheels);Rollator (4 wheels)      Prior Function Prior Level of Function : Needs assist             Mobility Comments: Pt states she uses her rollator to scoot around in her apartment, uses RW outside home. Pt's family and public  transport take her to/from appointments ADLs Comments: has assist from granddaughter for "cleaning up" once a week, otherwise pt tries to do what she can for herself. Granddaughter goes grocery shopping for her     Hand Dominance   Dominant Hand: Right    Extremity/Trunk Assessment   Upper Extremity Assessment Upper Extremity Assessment: Defer to OT evaluation    Lower Extremity Assessment Lower Extremity Assessment: Generalized weakness    Cervical / Trunk Assessment Cervical / Trunk Assessment: Other exceptions Cervical / Trunk Exceptions: preference for anterior trunk flexion, rollator user  Communication   Communication: No difficulties  Cognition Arousal/Alertness: Awake/alert Behavior During Therapy: WFL for tasks assessed/performed Overall Cognitive Status: Within Functional Limits for tasks assessed                                          General Comments General comments (skin integrity, edema, etc.): HRmax observed during mobility 117 bpm    Exercises     Assessment/Plan    PT Assessment Patient needs continued PT services  PT Problem List Decreased strength;Decreased mobility;Decreased safety awareness;Decreased activity tolerance;Decreased balance;Pain       PT Treatment Interventions DME instruction;Therapeutic activities;Gait training;Patient/family education;Balance training;Stair training;Therapeutic exercise;Functional mobility training;Neuromuscular re-education    PT Goals (Current goals can be found in the Care Plan section)  Acute Rehab PT Goals PT Goal Formulation: With patient Time For Goal Achievement: 08/28/21 Potential to Achieve Goals: Good    Frequency Min 3X/week     Co-evaluation               AM-PAC PT "6 Clicks" Mobility  Outcome Measure Help needed turning from your back to your side while in a flat bed without using bedrails?: None Help needed moving from lying on your back to sitting on the side of a  flat bed without using bedrails?: None Help needed moving to and from a bed to a chair (including a wheelchair)?: A Little Help needed standing up from a chair using your arms (e.g., wheelchair or bedside chair)?: A Little Help needed to walk in hospital room?: A Little Help needed climbing 3-5 steps with a railing? : A Little 6 Click Score: 20    End of Session   Activity Tolerance: Patient tolerated treatment well Patient left: in chair;with call bell/phone within reach Nurse Communication: Mobility status PT Visit Diagnosis: Other abnormalities of gait and mobility (R26.89)    Time: 9024-0973 PT Time Calculation (min) (ACUTE ONLY): 20 min   Charges:   PT Evaluation $PT Eval Low Complexity: 1 Low         Mearl Harewood S, PT DPT Acute Rehabilitation Services Pager [phone]  Office [phone]   Roxine Caddy E Ruffin Pyo 08/14/2021, 10:38 AM

## 2021-08-14 NOTE — TOC Initial Note (Signed)
Transition of Care [facility]) - Initial/Assessment Note    Patient Details  Name: Alexis Farmer MRN: [id number] Date of Birth: July 29, 1940  Transition of Care [facility]) CM/SW Contact:    Marilu Favre, RN Phone Number: 08/14/2021, 1:23 PM  Clinical Narrative:                 Spoke to patient at bedside. Confirmed face sheet information. Patient lives alone has grand daughter close. Patient has walker at home.   Patient in agreement with HHPT, offered choice, no preference. Patient did state she has had [facility] in past and wants  a different agency . She cannot remember the name of the agency however Dr Aundra Dubin Jacklynn Lewis office arranged it. Called PCP patient's home health was through [facility].  Cory with [facility] accepted referral for HHPT.   Expected Discharge Plan: [place] Barriers to Discharge: Continued Medical Work up   Patient Goals and CMS Choice Patient states their goals for this hospitalization and ongoing recovery are:: to return to home CMS Medicare.gov Compare Post Acute Care list provided to:: Patient Choice offered to / list presented to : Patient  Expected Discharge Plan and Services Expected Discharge Plan: [place]   Discharge Planning Services: CM Consult Post Acute Care Choice: [place] arrangements for the past 2 months: Apartment                   DME Agency: NA       HH Arranged: PT          Prior Living Arrangements/Services Living arrangements for the past 2 months: Apartment Lives with:: Self Patient language and need for interpreter reviewed:: Yes Do you feel safe going back to the place where you live?: Yes      Need for Family Participation in Patient Care: No (Comment)   Current home services: DME Criminal Activity/Legal Involvement Pertinent to Current Situation/Hospitalization: No - Comment as needed  Activities of Daily Living      Permission Sought/Granted   Permission granted to  share information with : Yes, Verbal Permission Granted  Share Information with NAME: PCP Dr Aundra DubinJacklynn Lewis           Emotional Assessment Appearance:: Appears stated age Attitude/Demeanor/Rapport: Engaged Affect (typically observed): Accepting Orientation: : Oriented to Self, Oriented to Place, Oriented to  Time, Oriented to Situation Alcohol / Substance Use: Not Applicable Psych Involvement: No (comment)  Admission diagnosis:  Gastroenteritis [K52.9] Generalized abdominal pain [R10.84] Diarrhea, unspecified type [R19.7] Patient Active Problem List   Diagnosis Date Noted   Gastroenteritis 08/12/2021   Lymphedema 04/19/2021   Atherosclerosis of aorta ([place]) 01/16/2021   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/16/2021   Spinal stenosis of lumbar region 01/16/2021   Stage 3a chronic kidney disease ([place]) 01/15/2021   Atypical chest pain 01/15/2021   Panic attack 01/15/2021   Hypertensive urgency 12/15/2020   Palpitations 12/15/2020   Elevated troponin 12/15/2020   Other chronic pain 08/25/2020   Hypertension associated with diabetes ([place]) 08/25/2020   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   Pure hypercholesterolemia 08/10/2020   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 ([place]) 05/30/2020   Retinopathy 05/30/2020   Primary hypertension 03/25/2020   Multinodular goiter 02/04/2020   Peripheral edema 12/22/2019   Proteinuria 12/22/2019   Constipation 11/17/2019   Recurrent falls 08/13/2019   Physical deconditioning 08/13/2019   Abdominal aortic atherosclerosis ([place]) 07/24/2019   Morbid obesity with BMI of 45.0-49.9, adult (  [place]) 06/09/2019   Hypercalcemia 03/18/2019   Round hole, unspecified eye 02/06/2019   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   Disorder of rotator cuff 06/24/2018   Abnormal gait 03/26/2018   Fall 03/26/2018   Arthritis 03/26/2018   Thyroid nodule 01/20/2018   Dysuria 03/22/2017   Cervical spondylosis  with radiculopathy 01/26/2017   Radiculopathy due to cervical spondylosis 01/26/2017   Rectal hemorrhage 07/29/2016   Carotid artery stenosis 07/19/2016   PAD (peripheral artery disease) ([place]) 07/19/2016   Swelling of lower leg 07/19/2016   Bradycardia 07/17/2016   Postmenopausal bleeding 07/17/2016   Chronic diastolic CHF (congestive heart failure) ([place]) 05/31/2016   Cough 01/25/2016   Chronic fatigue 11/15/2015   Depression, recurrent ([place]) 10/13/2015   Osteoarthritis 10/13/2015   Vertigo 10/13/2015   Anxiety 09/13/2015   Gastrointestinal hemorrhage 08/03/2015   Hyperglycemia due to type 2 diabetes mellitus ([place]) 03/11/2015   Mixed hyperlipidemia 08/26/2014   Essential hypertension 04/08/2014   Diabetes mellitus type II, uncontrolled 04/08/2014   Chronic kidney disease, stage 2 (mild) 04/08/2014   Edema of foot 04/08/2014   Morbid obesity ([place]) 04/08/2014   Low back pain 04/08/2014   Diabetic polyneuropathy ([place]) 04/08/2014   Type 2 diabetes mellitus with other diabetic kidney complication ([place]) 06/02/2445   PCP:  McLean-Scocuzza, Nino Glow, MD Pharmacy:   [id number] - [address], [place] [address] [place] [address] Phone: [phone] Fax: [phone]     Social Determinants of Health (SDOH) Interventions    Readmission Risk Interventions No flowsheet data found.

## 2021-08-14 NOTE — Progress Notes (Signed)
Mobility Specialist: Progress Note   08/14/21 1656  Mobility  Bed Position Chair  Activity Ambulated with assistance in hallway  Level of Assistance Standby assist, set-up cues, supervision of patient - no hands on  Assistive Device Front wheel walker  Distance Ambulated (ft) 120 ft  Activity Response Tolerated well  $Mobility charge 1 Mobility   Pt up from BR upon entering room and agreeable to ambulation. C/o bilateral knee soreness, otherwise asymptomatic. Pt to recliner after walk with call bell at her side.   [facility] Naomii Kreger Mobility Specialist Mobility Specialist 5 North: [phone] Mobility Specialist 6 North: [phone]

## 2021-08-15 DIAGNOSIS — K529 Noninfective gastroenteritis and colitis, unspecified: Secondary | ICD-10-CM | POA: Diagnosis not present

## 2021-08-15 LAB — BASIC METABOLIC PANEL
Anion gap: 7 (ref 5–15)
BUN: 5 mg/dL — ABNORMAL LOW (ref 8–23)
CO2: 27 mmol/L (ref 22–32)
Calcium: 9.2 mg/dL (ref 8.9–10.3)
Chloride: 104 mmol/L (ref 98–111)
Creatinine, Ser: 1.12 mg/dL — ABNORMAL HIGH (ref 0.44–1.00)
GFR, Estimated: 49 mL/min — ABNORMAL LOW (ref >60)
Glucose, Bld: 258 mg/dL — ABNORMAL HIGH (ref 70–99)
Potassium: 3.8 mmol/L (ref 3.5–5.1)
Sodium: 138 mmol/L (ref 135–145)

## 2021-08-15 LAB — GLUCOSE, CAPILLARY
Glucose-Capillary: 223 mg/dL — ABNORMAL HIGH (ref 70–99)
Glucose-Capillary: 244 mg/dL — ABNORMAL HIGH (ref 70–99)
Glucose-Capillary: 166 mg/dL — ABNORMAL HIGH (ref 70–99)

## 2021-08-15 MED ORDER — CLONIDINE HCL 0.2 MG PO TABS: 0.1000 mg | ORAL_TABLET | Freq: Two times a day (BID) | ORAL | 3 refills | Status: DC

## 2021-08-15 MED ORDER — ONDANSETRON HCL 4 MG PO TABS: 4.0000 mg | ORAL_TABLET | Freq: Four times a day (QID) | ORAL | 0 refills | Status: DC | PRN

## 2021-08-15 MED ORDER — HYDRALAZINE HCL 100 MG PO TABS: 50.0000 mg | ORAL_TABLET | Freq: Two times a day (BID) | ORAL | 3 refills | Status: DC

## 2021-08-15 NOTE — Evaluation (Signed)
Occupational Therapy Evaluation Patient Details Name: Alexis Farmer MRN: [id number] DOB: 11-01-1940 Today's Date: 08/15/2021   History of Present Illness 81 y.o. female presents to [facility] hospital on 2.25.2023 with nausea, vomiting, diarrhea, and abdominal pain. CT abdomen demonstrates ileus. PMH includes CKD stage II, type 2 diabetes, morbid obesity, hypertension.   Clinical Impression   Pt reports using rollator and furniture walking at baseline, has some assist from granddaughter for IADLs, states ADLs have become progressively difficulty but she is still independent with them. Pt currently supervision - min A for ADLs, supervision for transfer. Pt in bathroom toileting and completing standing grooming task upon arrival. Educated pt on safety at home, including having non-slip mat in tub, pt verbalizes understanding. Pt presenting with impairments listed below, will follow acutely. Recommend HHOT at d/c.     Recommendations for follow up therapy are one component of a multi-disciplinary discharge planning process, led by the attending physician.  Recommendations may be updated based on patient status, additional functional criteria and insurance authorization.   Follow Up Recommendations  Home health OT    Assistance Recommended at Discharge Set up Supervision/Assistance  Patient can return home with the following A little help with walking and/or transfers;A little help with bathing/dressing/bathroom;Assistance with cooking/housework    Functional Status Assessment  Patient has had a recent decline in their functional status and demonstrates the ability to make significant improvements in function in a reasonable and predictable amount of time.  Equipment Recommendations  Tub/shower seat    Recommendations for Other Services       Precautions / Restrictions Precautions Precautions: Fall Restrictions Weight Bearing Restrictions: No      Mobility Bed Mobility                General bed mobility comments: up in chair    Transfers Overall transfer level: Needs assistance Equipment used: Rolling walker (2 wheels) Transfers: Sit to/from Stand Sit to Stand: Supervision                  Balance Overall balance assessment: Needs assistance, History of Falls Sitting-balance support: No upper extremity supported, Feet supported Sitting balance-Leahy Scale: Fair     Standing balance support: Bilateral upper extremity supported, During functional activity Standing balance-Leahy Scale: Fair                             ADL either performed or assessed with clinical judgement   ADL Overall ADL's : Needs assistance/impaired Eating/Feeding: Independent;Sitting   Grooming: Supervision/safety;Standing;Sitting Grooming Details (indicate cue type and reason): washes hands standing at sink with distant supervision Upper Body Bathing: Supervision/ safety;Sitting   Lower Body Bathing: Minimal assistance;Sitting/lateral leans   Upper Body Dressing : Supervision/safety;Sitting   Lower Body Dressing: Minimal assistance;Moderate assistance;Sitting/lateral leans Lower Body Dressing Details (indicate cue type and reason): able to doff shoes Toilet Transfer: Supervision/safety;Ambulation;Rolling walker (2 wheels);Regular Glass blower/designer Details (indicate cue type and reason): completed with distant supervision Toileting- Clothing Manipulation and Hygiene: Supervision/safety;Sitting/lateral lean Toileting - Clothing Manipulation Details (indicate cue type and reason): completes pericare/clothing mgmt with distant supervision     Functional mobility during ADLs: Min guard;Rolling walker (2 wheels)       Vision Baseline Vision/History: 1 Wears glasses Vision Assessment?: No apparent visual deficits     Perception     Praxis      Pertinent Vitals/Pain Pain Assessment Pain Assessment: Faces Pain Score: 2  Faces Pain Scale: Hurts a  little  bit Pain Location: head (headache) RN has given tylenol Pain Descriptors / Indicators: Discomfort Pain Intervention(s): Limited activity within patient's tolerance, Monitored during session     Hand Dominance Right   Extremity/Trunk Assessment Upper Extremity Assessment Upper Extremity Assessment: Overall WFL for tasks assessed   Lower Extremity Assessment Lower Extremity Assessment: Generalized weakness       Communication Communication Communication: No difficulties   Cognition Arousal/Alertness: Awake/alert Behavior During Therapy: WFL for tasks assessed/performed Overall Cognitive Status: Within Functional Limits for tasks assessed                                       General Comments  VSS on RA    Exercises     Shoulder Instructions      Home Living Family/patient expects to be discharged to:: Private residence Living Arrangements: Alone   Type of Home: Apartment Home Access: Stairs to enter [employer] of Steps: 2 steps to enter   Home Layout: One level     Bathroom Shower/Tub: Tub/shower unit;Curtain   Biochemist, clinical: Standard     Home Equipment: Conservation officer, nature (2 wheels);Rollator (4 wheels);BSC/3in1   Additional Comments: reports having shirts in bottom of tub to prevent slipping, has sock aid      Prior Functioning/Environment Prior Level of Function : Needs assist             Mobility Comments: Pt states she uses her rollator to scoot around in her apartment, uses RW outside home. Pt's family and public transport take her to/from appointments ADLs Comments: has assist from granddaughter for "cleaning up" once a week, otherwise pt tries to do what she can for herself. Granddaughter goes grocery shopping for her, ADLs have been getting progressively diffuclt but she is still doing them independently        OT Problem List: Decreased strength;Decreased range of motion;Decreased activity tolerance;Impaired balance  (sitting and/or standing);Decreased knowledge of use of DME or AE;Decreased safety awareness      OT Treatment/Interventions: Self-care/ADL training;Therapeutic exercise;DME and/or AE instruction;Energy conservation;Therapeutic activities;Cognitive remediation/compensation;Patient/family education;Balance training    OT Goals(Current goals can be found in the care plan section) Acute Rehab OT Goals Patient Stated Goal: to go home OT Goal Formulation: With patient Time For Goal Achievement: 08/29/21 Potential to Achieve Goals: Good ADL Goals Pt Will Perform Upper Body Dressing: Independently;sitting Pt Will Perform Lower Body Dressing: sit to/from stand;sitting/lateral leans;with min guard assist Pt Will Transfer to Toilet: with modified independence;regular height toilet;ambulating Pt Will Perform Tub/Shower Transfer: with min guard assist;rolling walker;shower seat;ambulating;Tub transfer  OT Frequency: Min 2X/week    Co-evaluation              AM-PAC OT "6 Clicks" Daily Activity     Outcome Measure Help from another person eating meals?: None Help from another person taking care of personal grooming?: None Help from another person toileting, which includes using toliet, bedpan, or urinal?: A Little Help from another person bathing (including washing, rinsing, drying)?: A Little Help from another person to put on and taking off regular upper body clothing?: None Help from another person to put on and taking off regular lower body clothing?: A Lot 6 Click Score: 20   End of Session Equipment Utilized During Treatment: Rolling walker (2 wheels) Nurse Communication: Mobility status  Activity Tolerance: Patient tolerated treatment well Patient left: in chair;with call bell/phone within reach;with chair alarm  set  OT Visit Diagnosis: Unsteadiness on feet (R26.81);Other abnormalities of gait and mobility (R26.89);Muscle weakness (generalized) (M62.81);History of falling (Z91.81)                 Time: 7354-3014 OT Time Calculation (min): 33 min Charges:  OT General Charges $OT Visit: 1 Visit OT Evaluation $OT Eval Low Complexity: 1 Low OT Treatments $Self Care/Home Management : 8-22 mins  Lynnda Child, OTD, OTR/L Acute Rehab [phone]) [phone] - [place] 08/15/2021, 10:50 AM

## 2021-08-15 NOTE — Plan of Care (Signed)

## 2021-08-15 NOTE — Discharge Summary (Signed)
Physician Discharge Summary  Alexis Farmer JXB:[id number] DOB: 02-Oct-1940 DOA: 08/12/2021  PCP: McLean-Scocuzza, Nino Glow, MD  Admit date: 08/12/2021 Discharge date: 08/15/2021  Admitted From: Home  Disposition: Home  Recommendations for Outpatient Follow-up:  Follow up with PCP in 1-2 weeks Please obtain BMP/CBC in one week  Home Health: Yes Equipment/Devices: None  Discharge Condition: Stable CODE STATUS:Full code Diet recommendation: Cardiac   brief/Interim Summary:  81 year old female with history of CKD stage II, type 2 diabetes, morbid obesity, hypertension she was admitted with nausea vomiting diarrhea and abdominal pain. Discharge Diagnoses:  Principal Problem:   Gastroenteritis    #1 ileus-patient admitted with abdominal pain diarrhea nausea and vomiting.  She did not have any further diarrhea since admission in fact she was constipated she was given MiraLAX and Dulcolax suppository and she had bowel movements.  On the day of discharge she was tolerating p.o. intake without any nausea vomiting or diarrhea or constipation.  KUB improved on discharge.  Advised her to eat small frequent meals and increase activity as tolerated.  Home health PT on discharge. #2 hypertension-blood pressure 173/50.  Continue Demadex hydralazine and clonidine and spironolactone.  I have decreased the dose of clonidine and hydralazine since her blood pressure was not too high.    #3 stage II CKD-creatinine is 1.12 on discharge.    #4 type 2 diabetes hemoglobin A1c is 8.8.  Continue home meds.   #5 morbid obesity BMI 44.29.  Complicates overall care and prognosis.   #6 hyperlipidemia continue STATIN  Estimated body mass index is 44.29 kg/m as calculated from the following:   Height as of this encounter: _0  (1.6 m).   Weight as of this encounter: 113.4 kg.  Discharge Instructions  Discharge Instructions     Diet - low sodium heart healthy   Complete by: As directed    Increase  activity slowly   Complete by: As directed       Allergies as of 08/15/2021       Reactions   Celexa [citalopram]    Diarrhea upset stomach   Jardiance [empagliflozin] Other (See Comments)   Reaction not recalled   Norvasc [amlodipine]    Leg edema   Tape Other (See Comments)   Leaves the skin "raw" if left on for a period of time- tolerates paper tape   Penicillin V Rash   Penicillins Rash   Has patient had a PCN reaction causing immediate rash, facial/tongue/throat swelling, SOB or lightheadedness with hypotension: Yes Has patient had a PCN reaction causing severe rash involving mucus membranes or skin necrosis: No Has patient had a PCN reaction that required hospitalization No Has patient had a PCN reaction occurring within the last 10 years: Yes If all of the above answers are "NO", then may proceed with Cephalosporin use.        Medication List     STOP taking these medications    ammonium lactate 12 % lotion Commonly known as: AmLactin   meclizine 12.5 MG tablet Commonly known as: ANTIVERT   mupirocin ointment 2 % Commonly known as: Bactroban       TAKE these medications    Accu-Chek Guide test strip Generic drug: glucose blood USE AS INSTRUCTED 3 TIMES DAILY   acetaminophen 500 MG tablet Commonly known as: TYLENOL Take 500 mg by mouth every 6 (six) hours as needed for mild pain or headache.   aspirin EC 81 MG tablet Take 81 mg by mouth 2 (two) times a week. Swallow  whole.   B-D INS SYR ULTRAFINE .5CC/30G 30G X 1/2" 0.5 ML Misc Generic drug: Insulin Syringe-Needle U-100 1 Device by Does not apply route daily. Up to 5x per day with insulin bid (NPH) and tid (Regular)   blood glucose meter kit and supplies Kit Accu chek, Dx code E11.65, check 3 times daily   Centrum Silver 50+Women Tabs Take 1 tablet by mouth daily with breakfast.   cloNIDine 0.2 MG tablet Commonly known as: CATAPRES Take 0.5 tablets (0.1 mg total) by mouth 2 (two) times  daily. What changed:  how much to take when to take this   hydrALAZINE 100 MG tablet Commonly known as: APRESOLINE Take 0.5 tablets (50 mg total) by mouth 2 (two) times daily. What changed:  how much to take when to take this   insulin NPH Human 100 UNIT/ML injection Commonly known as: NovoLIN N INJECT 20 UNITS IN THE MORNING AND THE EVENING WITH A MEAL What changed:  how much to take how to take this when to take this additional instructions   insulin regular 100 units/mL injection Commonly known as: NovoLIN R Inject 0-0.08 mLs (0-8 Units total) into the skin 3 (three) times daily before meals. What changed:  how much to take when to take this additional instructions   LORazepam 0.5 MG tablet Commonly known as: ATIVAN Take 1 tablet (0.5 mg total) by mouth daily as needed for anxiety. D/c 0.25   ondansetron 4 MG tablet Commonly known as: ZOFRAN Take 1 tablet (4 mg total) by mouth every 6 (six) hours as needed for nausea.   polyethylene glycol powder 17 GM/SCOOP powder Commonly known as: GLYCOLAX/MIRALAX Take 17 g by mouth daily as needed for moderate constipation or severe constipation. Can take up to 2x per day prn. Mix with 8 ounces of liquid   PYRIDOXINE HCL PO Take 1 tablet by mouth daily.   rosuvastatin 20 MG tablet Commonly known as: CRESTOR TAKE 1 TABLET BY MOUTH EVERY DAY What changed:  how much to take how to take this when to take this additional instructions   spironolactone 25 MG tablet Commonly known as: ALDACTONE Take 1 tablet (25 mg total) by mouth daily.   torsemide 10 MG tablet Commonly known as: DEMADEX Take 1 tablet (10 mg total) by mouth daily.   VITAMIN C PO Take 1 tablet by mouth daily.        Follow-up Information     [facility] Follow up.   Specialty: Home Health Services Contact information: [place] [address] [phone]         McLean-Scocuzza, Nino Glow, MD Follow up.    Specialty: Internal Medicine Contact information: [place] [address] [phone]                Allergies  Allergen Reactions   Celexa [Citalopram]     Diarrhea upset stomach    Jardiance [Empagliflozin] Other (See Comments)    Reaction not recalled   Norvasc [Amlodipine]     Leg edema   Tape Other (See Comments)    Leaves the skin "raw" if left on for a period of time- tolerates paper tape   Penicillin V Rash   Penicillins Rash    Has patient had a PCN reaction causing immediate rash, facial/tongue/throat swelling, SOB or lightheadedness with hypotension: Yes Has patient had a PCN reaction causing severe rash involving mucus membranes or skin necrosis: No Has patient had a PCN reaction that  required hospitalization No Has patient had a PCN reaction occurring within the last 10 years: Yes If all of the above answers are "NO", then may proceed with Cephalosporin use.     Consultations: None   Procedures/Studies: DG Chest 1 View  Result Date: 08/14/2021 CLINICAL DATA:  Nausea, vomiting EXAM: CHEST  1 VIEW COMPARISON:  Previous studies including the examination of 12/15/2020 FINDINGS: Transverse diameter of heart is increased. Small transverse linear densities in the mid and lower lung fields may suggest scarring or subsegmental atelectasis. There are no signs of pulmonary edema or focal pulmonary consolidation. Degenerative changes are noted in the right shoulder. There is no pleural effusion or pneumothorax. IMPRESSION: Cardiomegaly. There are no signs of pulmonary edema or focal pulmonary consolidation. Electronically Signed   By: Elmer Picker M.D.   On: 08/14/2021 10:49   DG Abd 1 View  Result Date: 08/14/2021 CLINICAL DATA:  Abdominal pain EXAM: ABDOMEN - 1 VIEW COMPARISON:  08/13/2021 FINDINGS: Bowel gas pattern is nonspecific. There is decrease in degree of colonic distention. There are few dilated small bowel loops with gas in the  lumen. Stomach is unremarkable. IMPRESSION: There is interval decrease in degree colonic distention. Presence of gas in small bowel loops and colon suggests ileus. Electronically Signed   By: Elmer Picker M.D.   On: 08/14/2021 10:46   DG Abd 1 View  Result Date: 08/13/2021 CLINICAL DATA:  Abdominal pain EXAM: ABDOMEN - 1 VIEW COMPARISON:  None. FINDINGS: Bowel gas pattern is nonspecific. There is mild dilation of few small bowel loops. There is moderate gaseous distention of ascending and transverse colon. Stomach is not distended. No abnormal masses or calcifications are seen. There is faint contrast in urinary bladder residual from previous CT. IMPRESSION: Mild dilation of small-bowel loops and moderate gaseous distention of colon may suggest ileus. Electronically Signed   By: Elmer Picker M.D.   On: 08/13/2021 12:30   CT ABDOMEN PELVIS W CONTRAST  Result Date: 08/12/2021 CLINICAL DATA:  Acute abdominal pain and diarrhea for 3 days. 1 episode of vomiting. EXAM: CT ABDOMEN AND PELVIS WITH CONTRAST TECHNIQUE: Multidetector CT imaging of the abdomen and pelvis was performed using the standard protocol following bolus administration of intravenous contrast. RADIATION DOSE REDUCTION: This exam was performed according to the departmental dose-optimization program which includes automated exposure control, adjustment of the mA and/or kV according to patient size and/or use of iterative reconstruction technique. CONTRAST:  72m OMNIPAQUE IOHEXOL 350 MG/ML SOLN COMPARISON:  12/15/2020 FINDINGS: Lower Chest: No acute findings. Hepatobiliary: No hepatic masses identified. Gallbladder is not visualized, presumably due to prior cholecystectomy. No evidence of biliary ductal dilatation. Pancreas:  No mass or inflammatory changes. Spleen: Within normal limits in size and appearance. Adrenals/Urinary Tract: No masses identified. No evidence of ureteral calculi or hydronephrosis. Stomach/Bowel: Generalized  gaseous distention of small bowel and colon is seen, without evidence of obstruction or focal inflammatory process. No evidence of abscess, free fluid, or free air. Vascular/Lymphatic: No pathologically enlarged lymph nodes. No acute vascular findings. Aortic atherosclerotic calcification noted. Reproductive:  No mass or other significant abnormality. Other:  None. Musculoskeletal:  No suspicious bone lesions identified. IMPRESSION: Generalized gaseous distention of small bowel and colon, suspicious for ileus. No evidence of bowel obstruction, inflammatory process, or abnormal fluid collection. Aortic Atherosclerosis (ICD10-I70.0). Electronically Signed   By: JMarlaine HindM.D.   On: 08/12/2021 13:09   (Echo, Carotid, EGD, Colonoscopy, ERCP)    Subjective:  Patient sitting up in chair in  no acute distress she is tolerating p.o. intake She denies any nausea vomiting or diarrhea Discharge Exam: Vitals:   08/15/21 0501 08/15/21 0829  BP: (!) 119/49 135/74  Pulse: 77 83  Resp: 17 17  Temp: 98.8 F (37.1 C) 98.9 F (37.2 C)  SpO2: 95% 94%   Vitals:   08/14/21 1715 08/14/21 2025 08/15/21 0501 08/15/21 0829  BP: (!) 161/56 (!) 130/47 (!) 119/49 135/74  Pulse: 79 69 77 83  Resp: _0 Temp: 98.8 F (37.1 C) 98.9 F (37.2 C) 98.8 F (37.1 C) 98.9 F (37.2 C)  TempSrc: Oral Oral Oral Oral  SpO2: 95% 96% 95% 94%  Weight:      Height:        General: Pt is alert, awake, not in acute distress Cardiovascular: RRR, S1/S2 +, no rubs, no gallops Respiratory: CTA bilaterally, no wheezing, no rhonchi Abdominal: Soft, NT, ND, bowel sounds + Extremities: 2+ pitting edema   The results of significant diagnostics from this hospitalization (including imaging, microbiology, ancillary and laboratory) are listed below for reference.     Microbiology: Recent Results (from the past 240 hour(s))  Resp Panel by RT-PCR (Flu A&B, Covid) Nasopharyngeal Swab     Status: None   Collection Time:  08/12/21  3:00 PM   Specimen: Nasopharyngeal Swab; Nasopharyngeal(NP) swabs in vial transport medium  Result Value Ref Range Status   SARS Coronavirus 2 by RT PCR NEGATIVE NEGATIVE Final    Comment: (NOTE) SARS-CoV-2 target nucleic acids are NOT DETECTED.  The SARS-CoV-2 RNA is generally detectable in upper respiratory specimens during the acute phase of infection. The lowest concentration of SARS-CoV-2 viral copies this assay can detect is 138 copies/mL. A negative result does not preclude SARS-Cov-2 infection and should not be used as the sole basis for treatment or other patient management decisions. A negative result may occur with  improper specimen collection/handling, submission of specimen other than nasopharyngeal swab, presence of viral mutation(s) within the areas targeted by this assay, and inadequate number of viral copies(<138 copies/mL). A negative result must be combined with clinical observations, patient history, and epidemiological information. The expected result is Negative.  Fact Sheet for Patients:  EntrepreneurPulse.com.au  Fact Sheet for Healthcare Providers:  IncredibleEmployment.be  This test is no t yet approved or cleared by the [place] FDA and  has been authorized for detection and/or diagnosis of SARS-CoV-2 by FDA under an Emergency Use Authorization (EUA). This EUA will remain  in effect (meaning this test can be used) for the duration of the COVID-19 declaration under Section 564(b)(1) of the Act, 21 U.S.C.section 360bbb-3(b)(1), unless the authorization is terminated  or revoked sooner.       Influenza A by PCR NEGATIVE NEGATIVE Final   Influenza B by PCR NEGATIVE NEGATIVE Final    Comment: (NOTE) The Xpert Xpress SARS-CoV-2/FLU/RSV plus assay is intended as an aid in the diagnosis of influenza from Nasopharyngeal swab specimens and should not be used as a sole basis for treatment. Nasal washings  and aspirates are unacceptable for Xpert Xpress SARS-CoV-2/FLU/RSV testing.  Fact Sheet for Patients: EntrepreneurPulse.com.au  Fact Sheet for Healthcare Providers: IncredibleEmployment.be  This test is not yet approved or cleared by the [place] FDA and has been authorized for detection and/or diagnosis of SARS-CoV-2 by FDA under an Emergency Use Authorization (EUA). This EUA will remain in effect (meaning this test can be used) for the duration of the COVID-19 declaration under Section 564(b)(1) of the Act, 21  U.S.C. section 360bbb-3(b)(1), unless the authorization is terminated or revoked.  Performed at [facility], [place] [address], [place], [place] [address]      Labs: BNP (last 3 results) Recent Labs    12/15/20 0217  BNP 62.9   Basic Metabolic Panel: Recent Labs  Lab 08/12/21 1009 08/13/21 0025 08/14/21 0943 08/15/21 0926  NA 133* 136 136 138  K 4.9 3.6 3.9 3.8  CL 94* 100 101 104  CO2 _0 GLUCOSE 253* 268* 261* 258*  BUN _1 5*  CREATININE 1.19* 1.21* 1.27* 1.12*  CALCIUM 9.4 8.4* 8.5* 9.2   Liver Function Tests: Recent Labs  Lab 08/12/21 1009 08/13/21 0025 08/14/21 0943  AST 60* 26 31  ALT 34 24 25  ALKPHOS 42 38 39  BILITOT 2.1* 0.6 0.7  PROT 7.3 6.0* 6.4*  ALBUMIN 3.8 3.0* 3.2*   Recent Labs  Lab 08/12/21 1009  LIPASE 27   No results for input(s): AMMONIA in the last 168 hours. CBC: Recent Labs  Lab 08/12/21 1009 08/13/21 0025 08/14/21 0943  WBC 6.9 6.5 6.8  NEUTROABS 3.3  --   --   HGB 13.8 11.5* 12.2  HCT 43.1 36.8 38.6  MCV 90.5 91.8 90.4  PLT 240 212 205   Cardiac Enzymes: No results for input(s): CKTOTAL, CKMB, CKMBINDEX, TROPONINI in the last 168 hours. BNP: Invalid input(s): POCBNP CBG: Recent Labs  Lab 08/14/21 0933 08/14/21 1234 08/14/21 1718 08/14/21 2116 08/15/21 0803  GLUCAP 250* 197* 264* 287* 223*   D-Dimer No results for input(s): DDIMER in  the last 72 hours. Hgb A1c Recent Labs    08/13/21 0025  HGBA1C 8.8*   Lipid Profile No results for input(s): CHOL, HDL, LDLCALC, TRIG, CHOLHDL, LDLDIRECT in the last 72 hours. Thyroid function studies No results for input(s): TSH, T4TOTAL, T3FREE, THYROIDAB in the last 72 hours.  Invalid input(s): FREET3 Anemia work up No results for input(s): VITAMINB12, FOLATE, FERRITIN, TIBC, IRON, RETICCTPCT in the last 72 hours. Urinalysis    Component Value Date/Time   COLORURINE YELLOW 08/12/2021 1003   APPEARANCEUR HAZY (A) 08/12/2021 1003   APPEARANCEUR Clear 10/22/2018 0844   LABSPEC 1.009 08/12/2021 1003   PHURINE 7.0 08/12/2021 1003   GLUCOSEU 150 (A) 08/12/2021 1003   HGBUR SMALL (A) 08/12/2021 1003   BILIRUBINUR NEGATIVE 08/12/2021 1003   BILIRUBINUR Negative 10/22/2018 0844   KETONESUR NEGATIVE 08/12/2021 1003   PROTEINUR 100 (A) 08/12/2021 1003   UROBILINOGEN 0.2 03/09/2020 1022   NITRITE NEGATIVE 08/12/2021 1003   LEUKOCYTESUR TRACE (A) 08/12/2021 1003   Sepsis Labs Invalid input(s): PROCALCITONIN,  WBC,  LACTICIDVEN Microbiology Recent Results (from the past 240 hour(s))  Resp Panel by RT-PCR (Flu A&B, Covid) Nasopharyngeal Swab     Status: None   Collection Time: 08/12/21  3:00 PM   Specimen: Nasopharyngeal Swab; Nasopharyngeal(NP) swabs in vial transport medium  Result Value Ref Range Status   SARS Coronavirus 2 by RT PCR NEGATIVE NEGATIVE Final    Comment: (NOTE) SARS-CoV-2 target nucleic acids are NOT DETECTED.  The SARS-CoV-2 RNA is generally detectable in upper respiratory specimens during the acute phase of infection. The lowest concentration of SARS-CoV-2 viral copies this assay can detect is 138 copies/mL. A negative result does not preclude SARS-Cov-2 infection and should not be used as the sole basis for treatment or other patient management decisions. A negative result may occur with  improper specimen collection/handling, submission of specimen  other than nasopharyngeal swab,  presence of viral mutation(s) within the areas targeted by this assay, and inadequate number of viral copies(<138 copies/mL). A negative result must be combined with clinical observations, patient history, and epidemiological information. The expected result is Negative.  Fact Sheet for Patients:  EntrepreneurPulse.com.au  Fact Sheet for Healthcare Providers:  IncredibleEmployment.be  This test is no t yet approved or cleared by the [place] FDA and  has been authorized for detection and/or diagnosis of SARS-CoV-2 by FDA under an Emergency Use Authorization (EUA). This EUA will remain  in effect (meaning this test can be used) for the duration of the COVID-19 declaration under Section 564(b)(1) of the Act, 21 U.S.C.section 360bbb-3(b)(1), unless the authorization is terminated  or revoked sooner.       Influenza A by PCR NEGATIVE NEGATIVE Final   Influenza B by PCR NEGATIVE NEGATIVE Final    Comment: (NOTE) The Xpert Xpress SARS-CoV-2/FLU/RSV plus assay is intended as an aid in the diagnosis of influenza from Nasopharyngeal swab specimens and should not be used as a sole basis for treatment. Nasal washings and aspirates are unacceptable for Xpert Xpress SARS-CoV-2/FLU/RSV testing.  Fact Sheet for Patients: EntrepreneurPulse.com.au  Fact Sheet for Healthcare Providers: IncredibleEmployment.be  This test is not yet approved or cleared by the [place] FDA and has been authorized for detection and/or diagnosis of SARS-CoV-2 by FDA under an Emergency Use Authorization (EUA). This EUA will remain in effect (meaning this test can be used) for the duration of the COVID-19 declaration under Section 564(b)(1) of the Act, 21 U.S.C. section 360bbb-3(b)(1), unless the authorization is terminated or revoked.  Performed at [facility], [place] [address] [place],  [place] [address]      Time coordinating discharge: 39 minutes  SIGNED:   Georgette Shell, MD  [facility] 08/15/2021, 10:38 AM

## 2021-08-16 ENCOUNTER — Telehealth: Payer: Self-pay

## 2021-08-16 NOTE — Telephone Encounter (Signed)
Transition Care Management Unsuccessful Follow-up Telephone Call ? ?Date of discharge and from where:  08/15/21 [facility] ? ?Attempts:  1st Attempt ? ?Reason for unsuccessful TCM follow-up call:  Unable to reach patient. Appointment scheduled 08/22/21 @ 10:40. Will follow.  ? ?  ?

## 2021-08-17 ENCOUNTER — Telehealth: Payer: Self-pay | Admitting: Internal Medicine

## 2021-08-17 NOTE — Telephone Encounter (Signed)
Alexis Farmer from [facility] stated pt was in the hospital and patient BP was elevated 170/100 when she stood up it was 170/80, no symptoms, blood sugar was 243. Pt has several medications she does not follow that was written on discharge papers at hospital ?

## 2021-08-17 NOTE — Telephone Encounter (Signed)
Transition Care Management Follow-up Telephone Call ?Date of discharge and from where: 08/15/21 [facility] ?How have you been since you were released from the hospital? Some abdominal pain. Pain scale 5/10. Notes she has been in pain for years and this is normal for her. Tylenol 500mg  as directed. She does not feel it is effective. Appetite decreased. Eating small frequent meals. Taking Miralax as directed. BM normal. Staying hydrated. Soft diet. Denies N/V/D, chest pain, headache, dizziness, blurred vision, fever and all other alarming symptoms. Notes she left report of BP and BS with office earlier today. She has not yet checked it again. Declines checking during phone call but will after eating in just a little while.  ?Any questions or concerns? No. ? ?Items Reviewed: ?Did the pt receive and understand the discharge instructions provided? Yes  ?Medications obtained and verified? Yes  ?Any new allergies since your discharge? No  ?Dietary orders reviewed? Yes, low sodium heart healthy. She is leaning toward a soft diet. ?Do you have support at home? No . Lives alone.  ? ?Home Care and Equipment/Supplies: ?Were home health services ordered? Yes, [facility] consultation today. Starts next week.  ? ?Functional Questionnaire: (I = Independent and D = Dependent) ?ADLs:I ? ?Bathing/Dressing- I ? ?Meal Prep- I ? ?Eating- I ? ?Maintaining continence- I ? ?Transferring/Ambulation- walker ? ?Managing Meds- I ? ?Follow up appointments reviewed: ? ?PCP Hospital f/u appt confirmed? Yes  Scheduled to see PCP on 08/22/21 @ 10:40. ?Are transportation arrangements needed? No  ?If their condition worsens, is the pt aware to call PCP or go to the Emergency Dept.? Yes ?Was the patient provided with contact information for the PCP's office or ED? Yes ?Was to pt encouraged to call back with questions or concerns? Yes  ?

## 2021-08-21 NOTE — Telephone Encounter (Signed)
Noted, Patient scheduled to be seen 08/22/21 ?

## 2021-08-21 NOTE — Telephone Encounter (Signed)
Patient scheduled to be seen 08/22/21 for a hospital follow up. Transfer of care call completed by the nurse and was sent as an For your information to Dr Olivia Mackie McLean-Scocuzza  ?

## 2021-08-22 ENCOUNTER — Encounter: Payer: Self-pay | Admitting: Internal Medicine

## 2021-08-22 ENCOUNTER — Telehealth: Payer: Self-pay

## 2021-08-22 ENCOUNTER — Other Ambulatory Visit: Payer: Self-pay

## 2021-08-22 ENCOUNTER — Ambulatory Visit (INDEPENDENT_AMBULATORY_CARE_PROVIDER_SITE_OTHER): Payer: Medicare Other | Admitting: Internal Medicine

## 2021-08-22 VITALS — BP 160/64 | HR 93 | Temp 98.2°F | Ht 63.0 in | Wt 246.6 lb

## 2021-08-22 DIAGNOSIS — I6523 Occlusion and stenosis of bilateral carotid arteries: Secondary | ICD-10-CM

## 2021-08-22 DIAGNOSIS — R002 Palpitations: Secondary | ICD-10-CM | POA: Diagnosis not present

## 2021-08-22 DIAGNOSIS — K567 Ileus, unspecified: Secondary | ICD-10-CM | POA: Diagnosis not present

## 2021-08-22 DIAGNOSIS — E785 Hyperlipidemia, unspecified: Secondary | ICD-10-CM

## 2021-08-22 DIAGNOSIS — E1159 Type 2 diabetes mellitus with other circulatory complications: Secondary | ICD-10-CM | POA: Diagnosis not present

## 2021-08-22 DIAGNOSIS — N1831 Chronic kidney disease, stage 3a: Secondary | ICD-10-CM

## 2021-08-22 DIAGNOSIS — I10 Essential (primary) hypertension: Secondary | ICD-10-CM

## 2021-08-22 DIAGNOSIS — I152 Hypertension secondary to endocrine disorders: Secondary | ICD-10-CM

## 2021-08-22 MED ORDER — NEBIVOLOL HCL 2.5 MG PO TABS: 2.5000 mg | ORAL_TABLET | Freq: Every day | ORAL | 3 refills | Status: DC

## 2021-08-22 MED ORDER — HYDRALAZINE HCL 100 MG PO TABS: 100.0000 mg | ORAL_TABLET | Freq: Three times a day (TID) | ORAL | 3 refills | Status: DC

## 2021-08-22 MED ORDER — HYDRALAZINE HCL 100 MG PO TABS: 100.0000 mg | ORAL_TABLET | Freq: Four times a day (QID) | ORAL | 3 refills | Status: DC

## 2021-08-22 MED ORDER — ROSUVASTATIN CALCIUM 20 MG PO TABS: ORAL_TABLET | ORAL | 3 refills | Status: DC

## 2021-08-22 NOTE — Progress Notes (Signed)
Chief Complaint  Patient presents with   Hospitalization Follow-up   Hosp f/u [facility] 2/25-2/28/23 Had gastroenteritis turned into ileus passing gas and having bowel movements had diarrhea resolved and nausea results has prn zofran  Generalized pain body 8/10 but ab pain is better improving and appetite still low but improving  Declines GI referral for now  Htn uncontrolled on clonidine 0.1 bid, hydralazine now on 100 mg tid to qid, torsemed 10 mg qd and spironlactone 25 mg qd  With ckd 3a/b f/u renal  No sx's today with elevated BP   Review of Systems  Constitutional:  Negative for weight loss.  HENT:  Negative for hearing loss.   Eyes:  Negative for blurred vision.  Respiratory:  Negative for shortness of breath.   Cardiovascular:  Negative for chest pain.  Gastrointestinal:  Positive for abdominal pain. Negative for blood in stool, nausea and vomiting.  Genitourinary:  Negative for dysuria.  Musculoskeletal:  Negative for falls and joint pain.  Skin:  Negative for rash.  Neurological:  Negative for headaches.  Psychiatric/Behavioral:  Negative for depression.   Past Medical History:  Diagnosis Date   Arthritis    Chickenpox    Diabetes mellitus without complication ([place])    one elevated reading/ no treatment   Diverticulitis    GI bleed    High cholesterol    History of blood transfusion    Hypertension    Renal insufficiency    Past Surgical History:  Procedure Laterality Date   APPENDECTOMY     CHOLECYSTECTOMY     ECTOPIC PREGNANCY SURGERY     EYE SURGERY     bilateral cataracts   EYE SURGERY     02/11/2019 repair hole in right eye    gallbladder      HYSTEROSCOPY WITH D & C N/A 10/26/2016   Procedure: DILATATION AND CURETTAGE /HYSTEROSCOPY;  Surgeon: Benjaman Kindler, MD;  Location: [facility] ORS;  Service: Gynecology;  Laterality: N/A;   HYSTEROSCOPY WITH D & C N/A 07/07/2018   Procedure: DILATATION AND CURETTAGE /HYSTEROSCOPY;  Surgeon: Benjaman Kindler, MD;  Location:  [facility] ORS;  Service: Gynecology;  Laterality: N/A;   THYROIDECTOMY, PARTIAL     Family History  Problem Relation Age of Onset   Diabetes Mother    Hypertension Mother    Stroke Mother    Diabetes Other    Healthy Father    Diabetes Sister    Heart disease Sister    Social History   Socioeconomic History   Marital status: Widowed    Spouse name: Not on file   Number of children: 1   Years of education: 86   Highest education level: 12th grade  Occupational History   Occupation: retired    Comment: hx of Pharmacist, hospital, Secretary/administrator, 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  Tobacco Use   Smoking status: Never   Smokeless tobacco: Never  Vaping Use   Vaping Use: Never used  Substance and Sexual Activity   Alcohol use: No   Drug use: No   Sexual activity: Not Currently  Other Topics Concern   Not on file  Social History Narrative   Lives alone    From [place]   Widowed - was married 3 times starting at age 19    hx of seamtress, 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   Social Determinants of Health   Financial Resource Strain: Low Risk    Difficulty of Paying Living Expenses: Not  hard at all  Food Insecurity: No Food Insecurity   Worried About Charity fundraiser in the Last Year: Never true   Ran Out of Food in the Last Year: Never true  Transportation Needs: No Transportation Needs   Lack of Transportation (Medical): No   Lack of Transportation (Non-Medical): No  Physical Activity: Not on file  Stress: Stress Concern Present   Feeling of Stress : To some extent  Social Connections: Socially Isolated   Frequency of Communication with Friends and Family: More than three times a week   Frequency of Social Gatherings with Friends and Family: More than three times a week   Attends Religious Services: Never   Marine scientist or Organizations: No   Attends Archivist Meetings: Never   Marital  Status: Widowed  Human resources officer Violence: Not At Risk   Fear of Current or Ex-Partner: No   Emotionally Abused: No   Physically Abused: No   Sexually Abused: No   Current Meds  Medication Sig   ACCU-CHEK GUIDE test strip USE AS INSTRUCTED 3 TIMES DAILY   acetaminophen (TYLENOL) 500 MG tablet Take 500 mg by mouth every 6 (six) hours as needed for mild pain or headache.   Ascorbic Acid (VITAMIN C PO) Take 1 tablet by mouth daily.   aspirin EC 81 MG tablet Take 81 mg by mouth 2 (two) times a week. Swallow whole.   blood glucose meter kit and supplies KIT Accu chek, Dx code E11.65, check 3 times daily   cloNIDine (CATAPRES) 0.2 MG tablet Take 0.5 tablets (0.1 mg total) by mouth 2 (two) times daily.   insulin NPH Human (NOVOLIN N) 100 UNIT/ML injection INJECT 20 UNITS IN THE MORNING AND THE EVENING WITH A MEAL (Patient taking differently: Inject 20 Units into the skin 2 (two) times daily before a meal.)   insulin regular (NOVOLIN R) 100 units/mL injection Inject 0-0.08 mLs (0-8 Units total) into the skin 3 (three) times daily before meals. (Patient taking differently: Inject 5-15 Units into the skin See admin instructions. Inject between 5 to 15 units into the skin two to three times a day with meals, PER SLIDING SCALE)   Insulin Syringe-Needle U-100 (B-D INS SYR ULTRAFINE .5CC/30G) 30G X 1/2" 0.5 ML MISC 1 Device by Does not apply route daily. Up to 5x per day with insulin bid (NPH) and tid (Regular)   LORazepam (ATIVAN) 0.5 MG tablet Take 1 tablet (0.5 mg total) by mouth daily as needed for anxiety. D/c 0.25   Multiple Vitamins-Minerals (CENTRUM SILVER 50+WOMEN) TABS Take 1 tablet by mouth daily with breakfast.   nebivolol (BYSTOLIC) 2.5 MG tablet Take 1 tablet (2.5 mg total) by mouth daily.   polyethylene glycol powder (GLYCOLAX/MIRALAX) 17 GM/SCOOP powder Take 17 g by mouth daily as needed for moderate constipation or severe constipation. Can take up to 2x per day prn. Mix with 8 ounces of  liquid   PYRIDOXINE HCL PO Take 1 tablet by mouth daily.   spironolactone (ALDACTONE) 25 MG tablet Take 1 tablet (25 mg total) by mouth daily.   torsemide (DEMADEX) 10 MG tablet Take 1 tablet (10 mg total) by mouth daily.   [DISCONTINUED] hydrALAZINE (APRESOLINE) 100 MG tablet Take 0.5 tablets (50 mg total) by mouth 2 (two) times daily.   [DISCONTINUED] rosuvastatin (CRESTOR) 20 MG tablet TAKE 1 TABLET BY MOUTH EVERY DAY (Patient taking differently: Take 20 mg by mouth daily.)   Allergies  Allergen Reactions   Celexa [Citalopram]  Diarrhea upset stomach    Jardiance [Empagliflozin] Other (See Comments)    Reaction not recalled   Norvasc [Amlodipine]     Leg edema   Tape Other (See Comments)    Leaves the skin "raw" if left on for a period of time- tolerates paper tape   Penicillin V Rash   Penicillins Rash    Has patient had a PCN reaction causing immediate rash, facial/tongue/throat swelling, SOB or lightheadedness with hypotension: Yes Has patient had a PCN reaction causing severe rash involving mucus membranes or skin necrosis: No Has patient had a PCN reaction that required hospitalization No Has patient had a PCN reaction occurring within the last 10 years: Yes If all of the above answers are "NO", then may proceed with Cephalosporin use.    Recent Results (from the past 2160 hour(s))  Urinalysis, Routine w reflex microscopic     Status: Abnormal   Collection Time: 08/12/21 10:03 AM  Result Value Ref Range   Color, Urine YELLOW YELLOW   APPearance HAZY (A) CLEAR   Specific Gravity, Urine 1.009 1.005 - 1.030   pH 7.0 5.0 - 8.0   Glucose, UA 150 (A) NEGATIVE mg/dL   Hgb urine dipstick SMALL (A) NEGATIVE   Bilirubin Urine NEGATIVE NEGATIVE   Ketones, ur NEGATIVE NEGATIVE mg/dL   Protein, ur 100 (A) NEGATIVE mg/dL   Nitrite NEGATIVE NEGATIVE   Leukocytes,Ua TRACE (A) NEGATIVE   RBC / HPF 6-10 0 - 5 RBC/hpf   WBC, UA 0-5 0 - 5 WBC/hpf   Bacteria, UA RARE (A) NONE SEEN    Squamous Epithelial / LPF 0-5 0 - 5    Comment: Performed at [facility], [address] [place], [place] [address]  CBC with Differential     Status: None   Collection Time: 08/12/21 10:09 AM  Result Value Ref Range   WBC 6.9 4.0 - 10.5 K/uL   RBC 4.76 3.87 - 5.11 MIL/uL   Hemoglobin 13.8 12.0 - 15.0 g/dL   HCT 43.1 36.0 - 46.0 %   MCV 90.5 80.0 - 100.0 fL   MCH 29.0 26.0 - 34.0 pg   MCHC 32.0 30.0 - 36.0 g/dL   RDW 13.4 11.5 - 15.5 %   Platelets 240 150 - 400 K/uL   nRBC 0.0 0.0 - 0.2 %   Neutrophils Relative % 48 %   Neutro Abs 3.3 1.7 - 7.7 K/uL   Lymphocytes Relative 39 %   Lymphs Abs 2.7 0.7 - 4.0 K/uL   Monocytes Relative 12 %   Monocytes Absolute 0.8 0.1 - 1.0 K/uL   Eosinophils Relative 1 %   Eosinophils Absolute 0.1 0.0 - 0.5 K/uL   Basophils Relative 0 %   Basophils Absolute 0.0 0.0 - 0.1 K/uL   Immature Granulocytes 0 %   Abs Immature Granulocytes 0.03 0.00 - 0.07 K/uL    Comment: Performed at [facility], [address], [place], [place] [address]  Comprehensive metabolic panel     Status: Abnormal   Collection Time: 08/12/21 10:09 AM  Result Value Ref Range   Sodium 133 (L) 135 - 145 mmol/L   Potassium 4.9 3.5 - 5.1 mmol/L   Chloride 94 (L) 98 - 111 mmol/L   CO2 26 22 - 32 mmol/L   Glucose, Bld 253 (H) 70 - 99 mg/dL    Comment: Glucose reference range applies only to samples taken after fasting for at least 8 hours.   BUN 15 8 -  23 mg/dL   Creatinine, Ser 1.19 (H) 0.44 - 1.00 mg/dL   Calcium 9.4 8.9 - 10.3 mg/dL   Total Protein 7.3 6.5 - 8.1 g/dL   Albumin 3.8 3.5 - 5.0 g/dL   AST 60 (H) 15 - 41 U/L   ALT 34 0 - 44 U/L   Alkaline Phosphatase 42 38 - 126 U/L   Total Bilirubin 2.1 (H) 0.3 - 1.2 mg/dL   GFR, Estimated 46 (L) >60 mL/min    Comment: (NOTE) Calculated using the CKD-EPI Creatinine Equation (2021)    Anion gap 13 5 - 15    Comment: Performed at [facility] [address] [place], [place] [address]  Lipase, blood      Status: None   Collection Time: 08/12/21 10:09 AM  Result Value Ref Range   Lipase 27 11 - 51 U/L    Comment: Performed at [facility] [address], [place],  [address]  Resp Panel by RT-PCR (Flu A&B, Covid) Nasopharyngeal Swab     Status: None   Collection Time: 08/12/21  3:00 PM   Specimen: Nasopharyngeal Swab; Nasopharyngeal(NP) swabs in vial transport medium  Result Value Ref Range   SARS Coronavirus 2 by RT PCR NEGATIVE NEGATIVE    Comment: (NOTE) SARS-CoV-2 target nucleic acids are NOT DETECTED.  The SARS-CoV-2 RNA is generally detectable in upper respiratory specimens during the acute phase of infection. The lowest concentration of SARS-CoV-2 viral copies this assay can detect is 138 copies/mL. A negative result does not preclude SARS-Cov-2 infection and should not be used as the sole basis for treatment or other patient management decisions. A negative result may occur with  improper specimen collection/handling, submission of specimen other than nasopharyngeal swab, presence of viral mutation(s) within the areas targeted by this assay, and inadequate number of viral copies(<138 copies/mL). A negative result must be combined with clinical observations, patient history, and epidemiological information. The expected result is Negative.  Fact Sheet for Patients:  EntrepreneurPulse.com.au  Fact Sheet for Healthcare Providers:  IncredibleEmployment.be  This test is no t yet approved or cleared by the [place] FDA and  has been authorized for detection and/or diagnosis of SARS-CoV-2 by FDA under an Emergency Use Authorization (EUA). This EUA will remain  in effect (meaning this test can be used) for the duration of the COVID-19 declaration under Section 564(b)(1) of the Act, 21 U.S.C.section 360bbb-3(b)(1), unless the authorization is terminated  or revoked sooner.       Influenza A by PCR NEGATIVE NEGATIVE    Influenza B by PCR NEGATIVE NEGATIVE    Comment: (NOTE) The Xpert Xpress SARS-CoV-2/FLU/RSV plus assay is intended as an aid in the diagnosis of influenza from Nasopharyngeal swab specimens and should not be used as a sole basis for treatment. Nasal washings and aspirates are unacceptable for Xpert Xpress SARS-CoV-2/FLU/RSV testing.  Fact Sheet for Patients: EntrepreneurPulse.com.au  Fact Sheet for Healthcare Providers: IncredibleEmployment.be  This test is not yet approved or cleared by the [place] FDA and has been authorized for detection and/or diagnosis of SARS-CoV-2 by FDA under an Emergency Use Authorization (EUA). This EUA will remain in effect (meaning this test can be used) for the duration of the COVID-19 declaration under Section 564(b)(1) of the Act, 21 U.S.C. section 360bbb-3(b)(1), unless the authorization is terminated or revoked.  Performed at [facility], [place] [address] [place], [place] [address]   Glucose, capillary     Status: Abnormal   Collection  Time: 08/12/21  6:33 PM  Result Value Ref Range   Glucose-Capillary 232 (H) 70 - 99 mg/dL    Comment: Glucose reference range applies only to samples taken after fasting for at least 8 hours.  Glucose, capillary     Status: Abnormal   Collection Time: 08/12/21  9:02 PM  Result Value Ref Range   Glucose-Capillary 242 (H) 70 - 99 mg/dL    Comment: Glucose reference range applies only to samples taken after fasting for at least 8 hours.  Hemoglobin A1c     Status: Abnormal   Collection Time: 08/13/21 12:25 AM  Result Value Ref Range   Hgb A1c MFr Bld 8.8 (H) 4.8 - 5.6 %    Comment: (NOTE) Pre diabetes:          5.7%-6.4%  Diabetes:              >6.4%  Glycemic control for   <7.0% adults with diabetes    Mean Plasma Glucose 205.86 mg/dL    Comment: Performed at [facility], [place] [address] [place], [place] [address]  CBC     Status: Abnormal    Collection Time: 08/13/21 12:25 AM  Result Value Ref Range   WBC 6.5 4.0 - 10.5 K/uL   RBC 4.01 3.87 - 5.11 MIL/uL   Hemoglobin 11.5 (L) 12.0 - 15.0 g/dL   HCT 36.8 36.0 - 46.0 %   MCV 91.8 80.0 - 100.0 fL   MCH 28.7 26.0 - 34.0 pg   MCHC 31.3 30.0 - 36.0 g/dL   RDW 13.6 11.5 - 15.5 %   Platelets 212 150 - 400 K/uL   nRBC 0.0 0.0 - 0.2 %    Comment: Performed at [facility], [place] [address], [place],  [address]  Comprehensive metabolic panel     Status: Abnormal   Collection Time: 08/13/21 12:25 AM  Result Value Ref Range   Sodium 136 135 - 145 mmol/L   Potassium 3.6 3.5 - 5.1 mmol/L    Comment: DELTA CHECK NOTED   Chloride 100 98 - 111 mmol/L   CO2 29 22 - 32 mmol/L   Glucose, Bld 268 (H) 70 - 99 mg/dL    Comment: Glucose reference range applies only to samples taken after fasting for at least 8 hours.   BUN 10 8 - 23 mg/dL   Creatinine, Ser 1.21 (H) 0.44 - 1.00 mg/dL   Calcium 8.4 (L) 8.9 - 10.3 mg/dL   Total Protein 6.0 (L) 6.5 - 8.1 g/dL   Albumin 3.0 (L) 3.5 - 5.0 g/dL   AST 26 15 - 41 U/L   ALT 24 0 - 44 U/L   Alkaline Phosphatase 38 38 - 126 U/L   Total Bilirubin 0.6 0.3 - 1.2 mg/dL   GFR, Estimated 45 (L) >60 mL/min    Comment: (NOTE) Calculated using the CKD-EPI Creatinine Equation (2021)    Anion gap 7 5 - 15    Comment: Performed at [facility], [place] [address] [place], [place] [address]  Glucose, capillary     Status: Abnormal   Collection Time: 08/13/21  8:22 AM  Result Value Ref Range   Glucose-Capillary 234 (H) 70 - 99 mg/dL    Comment: Glucose reference range applies only to samples taken after fasting for at least 8 hours.  Glucose, capillary     Status: Abnormal   Collection Time: 08/13/21 12:46 PM  Result Value Ref Range   Glucose-Capillary 315 (H)  70 - 99 mg/dL    Comment: Glucose reference range applies only to samples taken after fasting for at least 8 hours.  Glucose, capillary     Status: Abnormal   Collection Time: 08/13/21   6:28 PM  Result Value Ref Range   Glucose-Capillary 200 (H) 70 - 99 mg/dL    Comment: Glucose reference range applies only to samples taken after fasting for at least 8 hours.  Glucose, capillary     Status: Abnormal   Collection Time: 08/13/21  6:49 PM  Result Value Ref Range   Glucose-Capillary 200 (H) 70 - 99 mg/dL    Comment: Glucose reference range applies only to samples taken after fasting for at least 8 hours.  Glucose, capillary     Status: Abnormal   Collection Time: 08/13/21  8:42 PM  Result Value Ref Range   Glucose-Capillary 189 (H) 70 - 99 mg/dL    Comment: Glucose reference range applies only to samples taken after fasting for at least 8 hours.  Glucose, capillary     Status: Abnormal   Collection Time: 08/13/21 11:09 PM  Result Value Ref Range   Glucose-Capillary 163 (H) 70 - 99 mg/dL    Comment: Glucose reference range applies only to samples taken after fasting for at least 8 hours.  Glucose, capillary     Status: Abnormal   Collection Time: 08/14/21  7:52 AM  Result Value Ref Range   Glucose-Capillary 217 (H) 70 - 99 mg/dL    Comment: Glucose reference range applies only to samples taken after fasting for at least 8 hours.  Glucose, capillary     Status: Abnormal   Collection Time: 08/14/21  9:33 AM  Result Value Ref Range   Glucose-Capillary 250 (H) 70 - 99 mg/dL    Comment: Glucose reference range applies only to samples taken after fasting for at least 8 hours.  CBC     Status: None   Collection Time: 08/14/21  9:43 AM  Result Value Ref Range   WBC 6.8 4.0 - 10.5 K/uL   RBC 4.27 3.87 - 5.11 MIL/uL   Hemoglobin 12.2 12.0 - 15.0 g/dL   HCT 38.6 36.0 - 46.0 %   MCV 90.4 80.0 - 100.0 fL   MCH 28.6 26.0 - 34.0 pg   MCHC 31.6 30.0 - 36.0 g/dL   RDW 13.4 11.5 - 15.5 %   Platelets 205 150 - 400 K/uL   nRBC 0.0 0.0 - 0.2 %    Comment: Performed at [facility], [place] [address] [place], [place] [address]  Comprehensive metabolic panel     Status: Abnormal    Collection Time: 08/14/21  9:43 AM  Result Value Ref Range   Sodium 136 135 - 145 mmol/L   Potassium 3.9 3.5 - 5.1 mmol/L   Chloride 101 98 - 111 mmol/L   CO2 26 22 - 32 mmol/L   Glucose, Bld 261 (H) 70 - 99 mg/dL    Comment: Glucose reference range applies only to samples taken after fasting for at least 8 hours.   BUN 12 8 - 23 mg/dL   Creatinine, Ser 1.27 (H) 0.44 - 1.00 mg/dL   Calcium 8.5 (L) 8.9 - 10.3 mg/dL   Total Protein 6.4 (L) 6.5 - 8.1 g/dL   Albumin 3.2 (L) 3.5 - 5.0 g/dL   AST 31 15 - 41 U/L   ALT 25 0 - 44 U/L   Alkaline Phosphatase 39 38 - 126 U/L   Total  Bilirubin 0.7 0.3 - 1.2 mg/dL   GFR, Estimated 42 (L) >60 mL/min    Comment: (NOTE) Calculated using the CKD-EPI Creatinine Equation (2021)    Anion gap 9 5 - 15    Comment: Performed at [facility] [address] [place], [place] [address]  Glucose, capillary     Status: Abnormal   Collection Time: 08/14/21 12:34 PM  Result Value Ref Range   Glucose-Capillary 197 (H) 70 - 99 mg/dL    Comment: Glucose reference range applies only to samples taken after fasting for at least 8 hours.  Glucose, capillary     Status: Abnormal   Collection Time: 08/14/21  5:18 PM  Result Value Ref Range   Glucose-Capillary 264 (H) 70 - 99 mg/dL    Comment: Glucose reference range applies only to samples taken after fasting for at least 8 hours.  Glucose, capillary     Status: Abnormal   Collection Time: 08/14/21  9:16 PM  Result Value Ref Range   Glucose-Capillary 287 (H) 70 - 99 mg/dL    Comment: Glucose reference range applies only to samples taken after fasting for at least 8 hours.  Glucose, capillary     Status: Abnormal   Collection Time: 08/15/21  8:03 AM  Result Value Ref Range   Glucose-Capillary 223 (H) 70 - 99 mg/dL    Comment: Glucose reference range applies only to samples taken after fasting for at least 8 hours.  Basic metabolic panel     Status: Abnormal   Collection Time: 08/15/21  9:26 AM  Result  Value Ref Range   Sodium 138 135 - 145 mmol/L   Potassium 3.8 3.5 - 5.1 mmol/L   Chloride 104 98 - 111 mmol/L   CO2 27 22 - 32 mmol/L   Glucose, Bld 258 (H) 70 - 99 mg/dL    Comment: Glucose reference range applies only to samples taken after fasting for at least 8 hours.   BUN 5 (L) 8 - 23 mg/dL   Creatinine, Ser 1.12 (H) 0.44 - 1.00 mg/dL   Calcium 9.2 8.9 - 10.3 mg/dL   GFR, Estimated 49 (L) >60 mL/min    Comment: (NOTE) Calculated using the CKD-EPI Creatinine Equation (2021)    Anion gap 7 5 - 15    Comment: Performed at [facility] [address] [place], [place] [address]  Glucose, capillary     Status: Abnormal   Collection Time: 08/15/21 12:01 PM  Result Value Ref Range   Glucose-Capillary 244 (H) 70 - 99 mg/dL    Comment: Glucose reference range applies only to samples taken after fasting for at least 8 hours.  Glucose, capillary     Status: Abnormal   Collection Time: 08/15/21  5:07 PM  Result Value Ref Range   Glucose-Capillary 166 (H) 70 - 99 mg/dL    Comment: Glucose reference range applies only to samples taken after fasting for at least 8 hours.   Objective  Body mass index is 43.68 kg/m. Wt Readings from Last 3 Encounters:  08/22/21 246 lb 9.6 oz (111.9 kg)  08/12/21 250 lb (113.4 kg)  05/22/21 252 lb 4 oz (114.4 kg)   Temp Readings from Last 3 Encounters:  08/22/21 98.2 F (36.8 C) (Oral)  08/15/21 98.9 F (37.2 C) (Oral)  04/19/21 (!) 97.1 F (36.2 C) (Temporal)   BP Readings from Last 3 Encounters:  08/22/21 (!) 160/64  08/15/21 135/74  05/22/21 (!) 170/70   Pulse Readings from  Last 3 Encounters:  08/22/21 93  08/15/21 83  05/22/21 88    Physical Exam Vitals and nursing note reviewed.  Constitutional:      Appearance: Normal appearance. She is well-developed and well-groomed.  HENT:     Head: Normocephalic and atraumatic.  Eyes:     Conjunctiva/sclera: Conjunctivae normal.     Pupils: Pupils are equal, round, and reactive to  light.  Cardiovascular:     Rate and Rhythm: Normal rate and regular rhythm.     Heart sounds: Normal heart sounds. No murmur heard. Pulmonary:     Effort: Pulmonary effort is normal.     Breath sounds: Normal breath sounds.  Abdominal:     General: Abdomen is flat. Bowel sounds are normal.     Tenderness: There is no abdominal tenderness.  Musculoskeletal:        General: No tenderness.  Skin:    General: Skin is warm and dry.  Neurological:     General: No focal deficit present.     Mental Status: She is alert and oriented to person, place, and time. Mental status is at baseline.     Cranial Nerves: Cranial nerves 2-12 are intact.     Gait: Gait is intact.  Psychiatric:        Attention and Perception: Attention and perception normal.        Mood and Affect: Mood and affect normal.        Speech: Speech normal.        Behavior: Behavior normal. Behavior is cooperative.        Thought Content: Thought content normal.        Cognition and Memory: Cognition and memory normal.        Judgment: Judgment normal.    Assessment  Plan  Ileus ([place]) - Plan: CANCELED: Ambulatory referral to Gastroenterology Call back when ready  Brat diet and hydration   Hypertension associated with diabetes ([place]) A1c improved >9 to 8.8 07/2021 - Plan: rosuvastatin (CRESTOR) 20 MG tablet, nebivolol (BYSTOLIC) 2.5 MG tablet add this  With spironlactone 35m qd, hydralazine 100 tid to qid, torsemide 10 mg qd  Clonidine 1/2 or 0.2 =0.1 mg bid  F/u cards and renal and endocrine  Hyperlipidemia, unspecified hyperlipidemia type - Plan: rosuvastatin (CRESTOR) 20 MG tablet   HM Flu shot utd  4/4 covid vxs had  Check on other vaccines (I.e Tdap, prevnar, pna 23, shingrix)  -as of 08/25/20 declines prevnar    mammo had 08/02/20 [facility] neg ordered again   [facility] 07/[facility]18 endometrial bx neg and same 10/26/16  Of and off bleeding f/u ob/gyn and given progestin 08/11/19 did not pick  up  appt 04/2020 gyn to f/u   DEXA pt declines for now  -no extra Ca h/o hyperCa    Colon[place]  [place] [place] -09/10/14 diverticulosis sessile inflammatory polyp=inflammatory and EGD chronic active gastritis   -f/u prn does not want to do further unless needed    CT chest 12/15/20  IMPRESSION: 1. Negative for aortic dissection or aneurysm.   2. 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 Coronary artery and Aortic atherosclerosis (ICD10-I70.0).   3. No superimposed acute or inflammatory process identified in the chest, abdomen, or pelvis. Diverticulosis of the large bowel and duodenum without active inflammation. 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  CXR 01/20/18 pulm vascular congestion otherwise neg  cxr 06/22/19 with scarring    02/03/18 thyroid bx negative   Of note CT renal 07/16/16 c/w bulging disc and L4/5 spinal stenosis    [facility] saw 03/29/19 closed fx lateral malleolus right fibular broken ankle Carlynn Spry [facility] [place] urgent [facility]    Specialists Renal Dr. Wallace Keller Cards Dr. Randell Patient endocrine      Provider: Dr. Olivia Mackie McLean-Scocuzza-Internal Medicine

## 2021-08-22 NOTE — Patient Instructions (Addendum)
Add bystolic 2.5 mg daily for blood pressure  BRAT diet bland   Let me know if GI referral desired     Bland Diet A bland diet consists of foods that are often soft and do not have a lot of fat, fiber, or extra seasonings. Foods without fat, fiber, or seasoning are easier for the body to digest. They are also less likely to irritate your mouth, throat, stomach, and other parts of your digestive system. A bland diet is sometimes called a BRAT diet. What is my plan? Your health care provider or food and nutrition specialist (dietitian) may recommend specific changes to your diet to prevent symptoms or to treat your symptoms. These changes may include: Eating small meals often. Cooking food until it is soft enough to chew easily. Chewing your food well. Drinking fluids slowly. Not eating foods that are very spicy, sour, or fatty. Not eating citrus fruits, such as oranges and grapefruit. What do I need to know about this diet? Eat a variety of foods from the bland diet food list. Do not follow a bland diet longer than needed. Ask your health care provider whether you should take vitamins or supplements. What foods can I eat? Grains Hot cereals, such as cream of wheat. Rice. Bread, crackers, or tortillas made from refined white flour. Vegetables Canned or cooked vegetables. Mashed or boiled potatoes. Fruits Bananas. Applesauce. Other types of cooked or canned fruit with the skin and seeds removed, such as canned peaches or pears. Meats and other proteins Scrambled eggs. Creamy peanut butter or other nut butters. Lean, well-cooked meats, such as chicken or fish. Tofu. Soups or broths. Dairy Low-fat dairy products, such as milk, cottage cheese, or yogurt. Beverages Water. Herbal tea. Apple juice. Fats and oils Mild salad dressings. Canola or olive oil. Sweets and desserts Pudding. Custard. Fruit gelatin. Ice cream. The items listed above may not be a complete list of recommended foods  and beverages. Contact a dietitian for more options. What foods are not recommended? Grains Whole grain breads and cereals. Vegetables Raw vegetables. Fruits Raw fruits, especially citrus, berries, or dried fruits. Dairy Whole fat dairy foods. Beverages Caffeinated drinks. Alcohol. Seasonings and condiments Strongly flavored seasonings or condiments. Hot sauce. Salsa. Other foods Spicy foods. Fried foods. Sour foods, such as pickled or fermented foods. Foods with high sugar content. Foods high in fiber. The items listed above may not be a complete list of foods and beverages to avoid. Contact a dietitian for more information. Summary A bland diet consists of foods that are often soft and do not have a lot of fat, fiber, or extra seasonings. Foods without fat, fiber, or seasoning are easier for the body to digest. Check with your health care provider to see how long you should follow this diet plan. It is not meant to be followed for long periods. This information is not intended to replace advice given to you by your health care provider. Make sure you discuss any questions you have with your health care provider. Document Revised: 07/03/2017 Document Reviewed: 07/03/2017 Elsevier Patient Education  2022 [employer].   Fluid therapy - Intravenous fluids should be administered to maintain normovolemia. Replacement intravenous fluids should be administered when there is a large volume of emesis or drainage from a nasogastric tube (one or more liters per day). (See "Maintenance and replacement fluid therapy in adults" and "Maintenance intravenous fluid therapy in children".) ?Bowel rest - The patient may be allowed sips of clear fluids, if tolerated.  Once abdominal distention resolves and bowel sounds return, the patient can be started on a liquid diet. When the patient has taken adequate fluids, the diet can be advanced and intravenous fluid discontinued.  Ileus Ileus is a condition  that happens when the intestines, which are also called bowels, stop working correctly. The intestines are hollow organs that digest food after the food leaves the stomach. These organs are long, muscular tubes that connect the stomach to the rectum. When ileus occurs, the muscular contractions that cause food to move through the intestines do not happen as they normally would. If the intestines stop working, food cannot pass through to get digested. This condition is a serious problem that usually requires hospitalization. It can cause symptoms such as nausea, abdominal pain, and bloating. Ileus can last from a few hours to a few days. What are the causes? This condition may be caused by: Surgery on the abdomen. An infection or inflammation in the abdomen. This includes inflammation of the lining of the abdomen (peritonitis). Infection or inflammation in other parts of the body, such as pneumonia or pancreatitis. Passage of gallstones or kidney stones. Damage to the nerves or blood vessels that go to the intestines. A collection of blood within the abdominal cavity. Imbalance in the salts in the blood (electrolytes). Injury to the brain or spinal cord. Medicines. Many medicines, including strong pain medicines, can cause ileus or make it worse. If the intestines stop working because of a blockage, that is a different condition that is called a bowel obstruction. What are the signs or symptoms? Symptoms of this condition include: Bloating of the abdomen. Pain or discomfort in the abdomen. Poor appetite. Nausea and vomiting. Lack of normal bowel sounds, such as "growling" in the stomach. How is this diagnosed? This condition may be diagnosed with: A physical exam and medical history. X-rays or a CT scan of the abdomen. You may also have other tests to help find the cause of the condition. How is this treated? This condition may be treated by: Resting the intestines until they start to work  again. This is often done by: Stopping oral intake of food and drink. You will be given fluid through an IV to prevent dehydration. Placing a small tube (nasogastric tube or NG tube) that is passed through your nose and into your stomach. The tube is attached to a suction device and keeps the stomach emptied out. This allows the bowels to rest and helps to reduce nausea and vomiting. Correcting any electrolyte imbalance by giving supplements in the IV fluid. Stopping any medicines that might make ileus worse. Treating any condition that may have caused ileus. Follow these instructions at home: Eating and drinking  Follow instructions from your health care provider about: What to eat and drink. You may be told to start eating a bland diet. Over time, you may slowly resume a more normal, healthy diet. How much to eat and drink. You should eat small meals often and stop eating when you feel full. Avoid alcohol. General instructions Take over-the-counter and prescription medicines only as told by your health care provider. Rest as told by your health care provider. Avoid sitting for a long time without moving. Get up to take short walks every 1-2 hours. Ask for help if you feel weak or unsteady. Keep all follow-up visits as told by your health care provider. This is important. Contact a health care provider if: You have nausea, vomiting, or abdominal discomfort. You have a fever.  Get help right away if: You have severe abdominal pain or bloating. You cannot eat or drink without vomiting. Summary Ileus is a condition that happens when the intestines, which are also called bowels, stop working correctly. When ileus occurs, the muscular contractions that cause food to move through the intestines do not happen as they normally would. Ileus can cause symptoms such as nausea, abdominal pain, and bloating. Treatment may involve getting IV fluids and having a nasogastric tube placed to keep your  stomach emptied out until the intestines start working again. This information is not intended to replace advice given to you by your health care provider. Make sure you discuss any questions you have with your health care provider. Document Revised: 12/14/2020 Document Reviewed: 12/14/2020 Elsevier Patient Education  [place].

## 2021-08-22 NOTE — Telephone Encounter (Signed)
CALLED PATIENT NO ANSWER LEFT VOICEMAIL FOR A CALL BACK ? ?

## 2021-08-23 ENCOUNTER — Telehealth: Payer: Medicare Other

## 2021-08-23 ENCOUNTER — Telehealth: Payer: Self-pay | Admitting: *Deleted

## 2021-08-23 NOTE — Telephone Encounter (Signed)
?  Care Management  ? ?Follow Up Note ? ? ?08/23/2021 ?Name: Alexis Farmer MRN: [id number] DOB: 22-Apr-1941 ? ? ?Referred by: McLean-Scocuzza, Nino Glow, MD ?Reason for referral : Chronic Care Management (POST HOSP, DM, CHF) ? ? ?An unsuccessful telephone outreach was attempted today. The patient was referred to the case management team for assistance with care management and care coordination.  ? ?Follow Up Plan: The care management team will reach out to the patient again over the next 20 days.  ? ?[name] RN, MSN ?RN Care Management Coordinator ?[place] ?[phone] ?[email or web address] ? ?

## 2021-08-28 ENCOUNTER — Telehealth: Payer: Self-pay | Admitting: Internal Medicine

## 2021-08-28 NOTE — Telephone Encounter (Signed)
Claiborne Billings from [facility] called in stating that pt is on medication (nebivolol (BYSTOLIC) 2.5 MG tablet) and (cloNIDine (CATAPRES) 0.2 MG tablet). Kelly from [facility] stated that its a level 2 interaction when pt is taking both medication. Claiborne Billings is requesting callback at [phone] ?

## 2021-08-29 NOTE — Telephone Encounter (Signed)
Call monitor HR last office visit was 93 she had been on metoprolol in the past with clonidine need better BP control  ? ?Call us back if HR <60 ?Please have her take this ? ?

## 2021-08-29 NOTE — Telephone Encounter (Signed)
Please advise, okay for Patient to take both medications?  ?

## 2021-08-30 ENCOUNTER — Ambulatory Visit: Payer: Medicare Other | Admitting: *Deleted

## 2021-08-30 DIAGNOSIS — I5032 Chronic diastolic (congestive) heart failure: Secondary | ICD-10-CM

## 2021-08-30 DIAGNOSIS — E1129 Type 2 diabetes mellitus with other diabetic kidney complication: Secondary | ICD-10-CM

## 2021-08-30 DIAGNOSIS — E1159 Type 2 diabetes mellitus with other circulatory complications: Secondary | ICD-10-CM

## 2021-08-30 NOTE — Chronic Care Management (AMB) (Signed)
?  Care Management  ? ?Follow Up Note ? ? ?08/30/2021 ?Name: Alexis Farmer MRN: [id number] DOB: 23-Apr-1941 ? ? ?Referred by: McLean-Scocuzza, Nino Glow, MD ?Reason for referral : Chronic Care Management (HTN, DM, CHF) ? ? ?PATIENT ANSWERED  initial call and stated this was not a good time and requested call back.  Did not answer 2nd call attempt later same day.  Voice message left. ? ?Follow Up Plan: The care management team will reach out to the patient again over the next 30  days.  ? ?[name] RN, MSN ?RN Care Management Coordinator ?[place] ?[phone] ?[email or web address] ? ? ?

## 2021-08-30 NOTE — Telephone Encounter (Signed)
Left message to return call 

## 2021-09-02 LAB — HM MAMMOGRAPHY

## 2021-09-06 ENCOUNTER — Encounter: Payer: Self-pay | Admitting: Internal Medicine

## 2021-09-06 ENCOUNTER — Ambulatory Visit (INDEPENDENT_AMBULATORY_CARE_PROVIDER_SITE_OTHER): Payer: Medicare Other | Admitting: *Deleted

## 2021-09-06 ENCOUNTER — Telehealth: Payer: Self-pay | Admitting: Podiatry

## 2021-09-06 DIAGNOSIS — I10 Essential (primary) hypertension: Secondary | ICD-10-CM

## 2021-09-06 DIAGNOSIS — E1129 Type 2 diabetes mellitus with other diabetic kidney complication: Secondary | ICD-10-CM

## 2021-09-06 NOTE — Telephone Encounter (Deleted)
error 

## 2021-09-06 NOTE — Telephone Encounter (Signed)
Lmovm for patient to call back -  patient needs transportation to her appointment .   We can only give here a bus voucher to get home or if she can get someone to bring her here this week I can give her a voucher to get her Monday as well

## 2021-09-06 NOTE — Telephone Encounter (Signed)
Pt called back and I explained the new transportaion to her and she was upset as she can barely walk and cannot do the bus. She was upset and crying and I did apologize but this is something we have no control over. I did ask pt to call her secondary insurance and see if they offer any assistance with transportation ?

## 2021-09-06 NOTE — Chronic Care Management (AMB) (Signed)
?  Care Management  ? ?Follow Up Note ? ? ?09/06/2021 ?Name: Alexis Farmer MRN: [id number] DOB: 1941-02-11 ? ? ?Referred by: McLean-Scocuzza, Nino Glow, MD ?Reason for referral : Chronic Care Management (DM, HTN) ? ? ?Received telephone call from patient asking about transportation for upcoming appointments.  RNCM  obtained number for [facility] and spoke with Traci, provided patient information and was told she should receive call back in the next 24 hours.  Patient updated and appreciative.  She is also upset and crying in frustration with trying to arrange transportation and being told to "ride the bus".   Patient too upset for follow up conversation today and requested call back.  Emotional support provided. ? ?Follow Up Plan: The care management team will reach out to the patient again over the next 20 days.  ? ?[name] RN, MSN ?RN Care Management Coordinator ?[place] ?[phone] ?[email or web address] ? ? ?

## 2021-09-08 ENCOUNTER — Telehealth: Payer: Self-pay | Admitting: Cardiovascular Disease

## 2021-09-08 NOTE — Telephone Encounter (Signed)
Pt c/o medication issue: ? ?1. Name of Medication: hydralazine  ? ?2. How are you currently taking this medication (dosage and times per day)? 100 MG 2 times a day ? ?3. Are you having a reaction (difficulty breathing--STAT)? no ? ?4. What is your medication issue? Patient states that she was originally taking this medication 4 times daily but the hospital changed her to 2 times a day.  Patient wants to know if Dr Rockey Situ is ok with this.  Patient wants to go back to taking 4 times a day. ?

## 2021-09-08 NOTE — Telephone Encounter (Signed)
Able to return call to Mrs. Mcgroarty, she wanted to report "I did not change how I was taking my hydralazine 100 mg QID." Pt went on to report "those hospital doctors are not my doctors and they don't know what I need, Dr. Rockey Situ knows my medication." ? ?Pt reports since discharge she "refused" to take her hydralazine as instructed, she continues to take hydralazine 100 mg QID. Per Dr. Rockey Situ at last [facility] 05/22/2021. ? ?Offer pt a f/u appt from hospital admission to discuss her medications, but pt refused, stated she will keep her appt in June with Dr. Rockey Situ.  ? ?Advised pt to monitor her BP and HR daily, pt reports needs new machine, is in the process of getting another BP machine. Once has machine advised to monitor her BP?HR several times a day as she continues to take her med 4 times a day of 100 mg Hydralazine. Pt voiced understanding, will bring log at her f/u appt in June.  ? ?Otherwise all questions were address and no additional concerns at this time. Mrs. Digilio thankful for the return call and advice. Agreeable to plan, will call back for anything further.   ? ?

## 2021-09-11 ENCOUNTER — Encounter: Payer: Self-pay | Admitting: Podiatry

## 2021-09-11 ENCOUNTER — Other Ambulatory Visit: Payer: Self-pay

## 2021-09-11 ENCOUNTER — Other Ambulatory Visit: Payer: Medicare Other

## 2021-09-11 ENCOUNTER — Ambulatory Visit (INDEPENDENT_AMBULATORY_CARE_PROVIDER_SITE_OTHER): Payer: Medicare Other | Admitting: Podiatry

## 2021-09-11 DIAGNOSIS — M79675 Pain in left toe(s): Secondary | ICD-10-CM

## 2021-09-11 DIAGNOSIS — M79674 Pain in right toe(s): Secondary | ICD-10-CM | POA: Diagnosis not present

## 2021-09-11 DIAGNOSIS — I739 Peripheral vascular disease, unspecified: Secondary | ICD-10-CM

## 2021-09-11 DIAGNOSIS — E1142 Type 2 diabetes mellitus with diabetic polyneuropathy: Secondary | ICD-10-CM

## 2021-09-11 DIAGNOSIS — S90222A Contusion of left lesser toe(s) with damage to nail, initial encounter: Secondary | ICD-10-CM | POA: Diagnosis not present

## 2021-09-11 DIAGNOSIS — B351 Tinea unguium: Secondary | ICD-10-CM

## 2021-09-11 NOTE — Telephone Encounter (Signed)
Tried to reach patient number not in service. ?

## 2021-09-13 ENCOUNTER — Ambulatory Visit: Payer: Medicare Other | Admitting: *Deleted

## 2021-09-13 DIAGNOSIS — I5032 Chronic diastolic (congestive) heart failure: Secondary | ICD-10-CM

## 2021-09-13 DIAGNOSIS — I10 Essential (primary) hypertension: Secondary | ICD-10-CM

## 2021-09-13 DIAGNOSIS — E1129 Type 2 diabetes mellitus with other diabetic kidney complication: Secondary | ICD-10-CM

## 2021-09-13 NOTE — Telephone Encounter (Signed)
According to chart Patient scheduled to be seen by Cardiology 10/10/21 ?

## 2021-09-13 NOTE — Chronic Care Management (AMB) (Signed)
?Chronic Care Management  ? ?CCM RN Visit Note ? ?09/13/2021 ?Name: Alexis Farmer MRN: [id number] DOB: 10/10/1940 ? ?Subjective: ?Alexis Farmer is a 81 y.o. year old female who is a primary care patient of McLean-Scocuzza, Nino Glow, MD. The care management team was consulted for assistance with disease management and care coordination needs.   ? ?Engaged with patient by telephone for follow up visit in response to provider referral for case management and/or care coordination services.  ? ?Consent to Services:  ?The patient was given information about Chronic Care Management services, agreed to services, and gave verbal consent prior to initiation of services.  Please see initial visit note for detailed documentation.  ? ?Patient agreed to services and verbal consent obtained.  ? ?Assessment: Review of patient past medical history, allergies, medications, health status, including review of consultants reports, laboratory and other test data, was performed as part of comprehensive evaluation and provision of chronic care management services.  ? ?SDOH (Social Determinants of Health) assessments and interventions performed:   ? ?CCM Care Plan ? ?Allergies  ?Allergen Reactions  ? Celexa [Citalopram]   ?  Diarrhea upset stomach ?  ? Jardiance [Empagliflozin] Other (See Comments)  ?  Reaction not recalled  ? Norvasc [Amlodipine]   ?  Leg edema  ? Tape Other (See Comments)  ?  Leaves the skin "raw" if left on for a period of time- tolerates paper tape  ? Penicillin V Rash  ? Penicillins Rash  ?  Has patient had a PCN reaction causing immediate rash, facial/tongue/throat swelling, SOB or lightheadedness with hypotension: Yes ?Has patient had a PCN reaction causing severe rash involving mucus membranes or skin necrosis: No ?Has patient had a PCN reaction that required hospitalization No ?Has patient had a PCN reaction occurring within the last 10 years: Yes ?If all of the above answers are "NO", then may proceed with  Cephalosporin use. ?  ? ? ?Outpatient Encounter Medications as of 09/13/2021  ?Medication Sig Note  ? ACCU-CHEK GUIDE test strip USE AS INSTRUCTED 3 TIMES DAILY   ? acetaminophen (TYLENOL) 500 MG tablet Take 500 mg by mouth every 6 (six) hours as needed for mild pain or headache.   ? Ascorbic Acid (VITAMIN C PO) Take 1 tablet by mouth daily.   ? aspirin EC 81 MG tablet Take 81 mg by mouth 2 (two) times a week. Swallow whole. 08/12/2021: No specific days   ? blood glucose meter kit and supplies KIT Accu chek, Dx code E11.65, check 3 times daily   ? cloNIDine (CATAPRES) 0.2 MG tablet Take 0.5 tablets (0.1 mg total) by mouth 2 (two) times daily.   ? hydrALAZINE (APRESOLINE) 100 MG tablet Take 1 tablet (100 mg total) by mouth in the morning, at noon, in the evening, and at bedtime.   ? insulin NPH Human (NOVOLIN N) 100 UNIT/ML injection INJECT 20 UNITS IN THE MORNING AND THE EVENING WITH A MEAL (Patient taking differently: Inject 20 Units into the skin 2 (two) times daily before a meal.)   ? insulin regular (NOVOLIN R) 100 units/mL injection Inject 0-0.08 mLs (0-8 Units total) into the skin 3 (three) times daily before meals. (Patient taking differently: Inject 5-15 Units into the skin See admin instructions. Inject between 5 to 15 units into the skin two to three times a day with meals, PER SLIDING SCALE)   ? Insulin Syringe-Needle U-100 (B-D INS SYR ULTRAFINE .5CC/30G) 30G X 1/2" 0.5 ML MISC 1 Device by Does not apply  route daily. Up to 5x per day with insulin bid (NPH) and tid (Regular)   ? LORazepam (ATIVAN) 0.5 MG tablet Take 1 tablet (0.5 mg total) by mouth daily as needed for anxiety. D/c 0.25   ? Multiple Vitamins-Minerals (CENTRUM SILVER 50+WOMEN) TABS Take 1 tablet by mouth daily with breakfast.   ? nebivolol (BYSTOLIC) 2.5 MG tablet Take 1 tablet (2.5 mg total) by mouth daily.   ? ondansetron (ZOFRAN) 4 MG tablet Take 1 tablet (4 mg total) by mouth every 6 (six) hours as needed for nausea. (Patient not taking:  Reported on 08/22/2021)   ? polyethylene glycol powder (GLYCOLAX/MIRALAX) 17 GM/SCOOP powder Take 17 g by mouth daily as needed for moderate constipation or severe constipation. Can take up to 2x per day prn. Mix with 8 ounces of liquid   ? PYRIDOXINE HCL PO Take 1 tablet by mouth daily.   ? rosuvastatin (CRESTOR) 20 MG tablet TAKE 1 TABLET BY MOUTH EVERY DAY   ? spironolactone (ALDACTONE) 25 MG tablet Take 1 tablet (25 mg total) by mouth daily.   ? torsemide (DEMADEX) 10 MG tablet Take 1 tablet (10 mg total) by mouth daily.   ? ?No facility-administered encounter medications on file as of 09/13/2021.  ? ? ?Patient Active Problem List  ? Diagnosis Date Noted  ? Ileus ([place]) 08/22/2021  ? Gastroenteritis 08/12/2021  ? Lymphedema 04/19/2021  ? Atherosclerosis of aorta ([place]) 01/16/2021  ? 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/16/2021  ? Spinal stenosis of lumbar region 01/16/2021  ? Stage 3a chronic kidney disease ([place]) 01/15/2021  ? Atypical chest pain 01/15/2021  ? Panic attack 01/15/2021  ? Hypertensive urgency 12/15/2020  ? Palpitations 12/15/2020  ? Elevated troponin 12/15/2020  ? Other chronic pain 08/25/2020  ? Hypertension associated with diabetes ([place]) 08/25/2020  ? 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  ? Pure hypercholesterolemia 08/10/2020  ? 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 Retinopathy 05/30/2020  ? Primary hypertension 03/25/2020  ? Multinodular goiter 02/04/2020  ? Peripheral edema 12/22/2019  ? Proteinuria 12/22/2019  ? Constipation 11/17/2019  ? Recurrent falls 08/13/2019  ? Physical deconditioning 08/13/2019  ? Abdominal aortic atherosclerosis ([place]) 07/24/2019  ? Morbid obesity with BMI of 45.0-49.9, adult ([place]) 06/09/2019  ? Hypercalcemia 03/18/2019  ? Round hole, unspecified eye 02/06/2019  ? 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 Disorder of rotator cuff 06/24/2018  ? Abnormal gait 03/26/2018  ? Fall 03/26/2018  ?  Arthritis 03/26/2018  ? Thyroid nodule 01/20/2018  ? Dysuria 03/22/2017  ? Cervical spondylosis with radiculopathy 01/26/2017  ? Radiculopathy due to cervical spondylosis 01/26/2017  ? Rectal hemorrhage 07/29/2016  ? Carotid artery stenosis 07/19/2016  ? PAD (peripheral artery disease) ([place]) 07/19/2016  ? Swelling of lower leg 07/19/2016  ? Bradycardia 07/17/2016  ? Postmenopausal bleeding 07/17/2016  ? Chronic diastolic CHF (congestive heart failure) ([place]) 05/31/2016  ? Cough 01/25/2016  ? Chronic fatigue 11/15/2015  ? Depression, recurrent ([place]) 10/13/2015  ? Osteoarthritis 10/13/2015  ? Vertigo 10/13/2015  ? Anxiety 09/13/2015  ? Gastrointestinal hemorrhage 08/03/2015  ? Hyperglycemia due to type 2 diabetes mellitus ([place]) 03/11/2015  ? Mixed hyperlipidemia 08/26/2014  ? Essential hypertension 04/08/2014  ? Diabetes mellitus type II, uncontrolled 04/08/2014  ? Chronic kidney disease, stage 2 (mild) 04/08/2014  ? Edema of foot 04/08/2014  ? Morbid obesity ([place]) 04/08/2014  ? Low back pain 04/08/2014  ? Diabetic polyneuropathy ([place]) 04/08/2014  ? Type 2 diabetes mellitus with  other diabetic kidney complication ([place]) 06/81/6619  ? ? ?Conditions to be addressed/monitored:CHF, HTN, and DMII ? ?Care Plan : RNCM  Diabetes Type 2 & Heart Failure (Adult)  ?Updates made by Leona Singleton, RN since 09/13/2021 12:00 AM  ?  ? ?Problem: Glycemic & Heart Failure Management (Diabetes, Type 2)   ?Priority: Medium  ?  ? ?Long-Range Goal: Patient will verbalize decrease in Hgb A1C by 0.3 points within the next 90 days   ?Start Date: 09/28/2020  ?Expected End Date: 08/15/2021  ?Recent Progress: Not on track  ?Priority: Medium  ?Note:   ?Objective:  ?Lab Results  ?Component Value Date  ? HGBA1C 8.8 (H) 08/13/2021  ?Current Barriers:  ?Knowledge Deficits related to basic Diabetes pathophysiology and self care/management as evidenced by continued elevated blood sugars Lack of self motivation to self management of diabetes ?Knowledge  deficit related to basic heart failure pathophysiology and self care management as evidenced by not knowing heart failure diagnosis and treatment/self care management.  ?3/29--recent hospitalization for ileus/abdo

## 2021-09-13 NOTE — Patient Instructions (Addendum)
Visit Information ? ?Thank you for taking time to visit with me today. Please don't hesitate to contact me if I can be of assistance to you before our next scheduled telephone appointment. ? ?Following are the goals we discussed today:  ?Check blood sugar at least 4 times a day ?Check blood sugar if I feel it is too high or too low ?Enter blood sugar readings and medication/insulin into daily log ?Take the blood sugar log to all doctor visits  ?Discuss goal A1C with endocrinology  ?Avoid or limit Carbs/Sweets/Sugars ?Take insulins as prescribed (25 Units in am and 15 Units in pm) ?Call office if I gain more than 2 pounds in one day or 5 pounds in one week ?Obtain home scale ?Use salt in moderation ?Watch for swelling in feet, ankles and legs every day; elevate legs when sitting ?Weigh myself daily and write weights in log ?Take log to all provider appointments ?Take BP in Left arm, recording reading for provider review ? ?Our next appointment is by telephone on 4/28 at 1330 ? ?Please call the care guide team at [phone] if you need to cancel or reschedule your appointment.  ? ?If you are experiencing a Mental Health or [place] or need someone to talk to, please call the Suicide and Crisis Lifeline: 988 ?call the [place] National Suicide Prevention Lifeline: 774-012-7308 or TTY: 807-008-7061 TTY 337-328-3883) to talk to a trained counselor ?call 1-800-273-TALK (toll free, 24 hour hotline) ?go to [facility] [address] [place] [phone]) ?call 911  ? ?The patient verbalized understanding of instructions, educational materials, and care plan provided today and declined offer to receive copy of patient instructions, educational materials, and care plan.  ? ?[name] RN, MSN ?RN Care Management Coordinator ?[place] ?[phone] ?[email or web address] ? ?

## 2021-09-15 DIAGNOSIS — I509 Heart failure, unspecified: Secondary | ICD-10-CM | POA: Diagnosis not present

## 2021-09-15 DIAGNOSIS — Z794 Long term (current) use of insulin: Secondary | ICD-10-CM

## 2021-09-15 DIAGNOSIS — E1159 Type 2 diabetes mellitus with other circulatory complications: Secondary | ICD-10-CM

## 2021-09-16 NOTE — Progress Notes (Signed)
?Subjective:  ?Patient ID: Alexis Farmer, female    DOB: 07-Mar-1941,  MRN: [id number] ? ?Alexis Farmer presents to clinic today for at risk foot care with history of diabetic neuropathy and painful elongated mycotic toenails 1-5 bilaterally which are tender when wearing enclosed shoe gear. Pain is relieved with periodic professional debridement. ? ?Patient states blood glucose was 238 mg/dl today.  Last HgA1c was 8.8%. ? ?Patient states her left 5th digit is a little sore. Unsure if she has hit it on anything. Denies any redness, drainage or swelling. ? ?PCP is McLean-Scocuzza, Nino Glow, MD , and last visit was August 22, 2021. ? ?Allergies  ?Allergen Reactions  ? Celexa [Citalopram]   ?  Diarrhea upset stomach ?  ? Jardiance [Empagliflozin] Other (See Comments)  ?  Reaction not recalled  ? Norvasc [Amlodipine]   ?  Leg edema  ? Tape Other (See Comments)  ?  Leaves the skin "raw" if left on for a period of time- tolerates paper tape  ? Penicillin V Rash  ? Penicillins Rash  ?  Has patient had a PCN reaction causing immediate rash, facial/tongue/throat swelling, SOB or lightheadedness with hypotension: Yes ?Has patient had a PCN reaction causing severe rash involving mucus membranes or skin necrosis: No ?Has patient had a PCN reaction that required hospitalization No ?Has patient had a PCN reaction occurring within the last 10 years: Yes ?If all of the above answers are "NO", then may proceed with Cephalosporin use. ?  ? ? ?Review of Systems: Negative except as noted in the HPI. ? ?Objective: No changes noted in today's physical examination. ?General: Patient is a pleasant 81 y.o. African American female morbidly obese in NAD. AAO x 3.  ? ?Neurovascular Examination: ?CFT <3 seconds b/l LE. Nonpalpable DP pulse(s) b/l LE. Nonpalpable PT pulse(s) b/l LE. Pedal hair absent. No pain with calf compression b/l. Nonpitting edema noted BLE. Evidence of chronic venous insufficiency b/l LE. No cyanosis or clubbing noted b/l  LE. ? ?Protective sensation decreased with 10 gram monofilament b/l. ? ?Dermatological:  ?Pedal integument with normal turgor, texture and tone b/l LE. No open wounds b/l. No interdigital macerations b/l. Toenails 1-4 bilaterally and right 5th toe elongated, thickened, discolored with subungual debris. +Tenderness with dorsal palpation of nailplates. Left 5th toenail with evidence of subungual hematoma with onycholysis and old dried heme. No hyperkeratotic or porokeratotic lesions present. ? ?Musculoskeletal:  ?Muscle strength 5/5 to all lower extremity muscle groups bilaterally. Pes planus deformity noted bilateral LE. ? ? ?  Latest Ref Rng & Units 08/13/2021  ? 12:25 AM 04/19/2021  ?  1:51 PM 12/15/2020  ?  2:51 PM 10/17/2020  ? 10:08 AM  ?Hemoglobin A1C  ?Hemoglobin-A1c 4.8 - 5.6 % 8.8   9.9   9.8   9.8    ? ?Assessment/Plan: ?1. Pain due to onychomycosis of toenails of both feet   ?2. Subungual hematoma of fifth toe of left foot, initial encounter   ?3. PAD (peripheral artery disease) ([place])   ?4. Diabetic peripheral neuropathy associated with type 2 diabetes mellitus ([place])   ?-Patient was evaluated and treated. All patient's and/or POA's questions/concerns answered on today's visit. ?-Diabetic foot examination performed today. ?-Continue foot and shoe inspections daily. Monitor blood glucose per PCP/Endocrinologist's recommendations. ?-Toenails 1-4 bilaterally and R 5th toe debrided in length and girth without iatrogenic bleeding with sterile nail nipper and dremel.  ?-Loose nailplate L 5th toe gently debrided to level of adherence. Digit cleansed with alcohol. Triple antibiotic  ointment applied to nailbed followed by light dressing. No further treatment required by patient. ?-Patient/POA to call should there be question/concern in the interim.  ? ?Return in about 3 months (around 12/12/2021). ? ?[name], DPM  ?

## 2021-09-26 ENCOUNTER — Telehealth: Payer: Self-pay | Admitting: Cardiovascular Disease

## 2021-09-26 DIAGNOSIS — R002 Palpitations: Secondary | ICD-10-CM

## 2021-09-26 DIAGNOSIS — I6523 Occlusion and stenosis of bilateral carotid arteries: Secondary | ICD-10-CM

## 2021-09-26 DIAGNOSIS — I10 Essential (primary) hypertension: Secondary | ICD-10-CM

## 2021-09-26 NOTE — Telephone Encounter (Signed)
Pt c/o medication issue: ? ?1. Name of Medication: hydrALAZINE (APRESOLINE) 100 MG tablet ? ?2. How are you currently taking this medication (dosage and times per day)? 1 tablet 4 times a day ? ?3. Are you having a reaction (difficulty breathing--STAT)? no ? ?4. What is your medication issue? Patient states she was taking the medication 4 times a day, but then the hospital changed it to 2 times a day. She says she has been taking it as 4 times a day and is now almost out.  ?

## 2021-09-27 MED ORDER — HYDRALAZINE HCL 100 MG PO TABS: 100.0000 mg | ORAL_TABLET | Freq: Four times a day (QID) | ORAL | 3 refills | Status: DC

## 2021-09-27 NOTE — Telephone Encounter (Signed)
"  If blood pressure has been elevated since being in the hospital end of February 2023  ?Okay to go back on higher dose hydralazine 100 mg 4 times a day  ?Hospitalist also cut back on the clonidine, in half  ?If blood pressure continues to run high, we may need to go back up on the clonidine  ?Thx  ?TGollan" ? ?Called patient. No answer. Left message to let patient know that refill has been called in of hydralazine.  ?

## 2021-10-02 ENCOUNTER — Other Ambulatory Visit: Payer: Self-pay | Admitting: Internal Medicine

## 2021-10-02 DIAGNOSIS — Z794 Long term (current) use of insulin: Secondary | ICD-10-CM

## 2021-10-02 DIAGNOSIS — E1165 Type 2 diabetes mellitus with hyperglycemia: Secondary | ICD-10-CM

## 2021-10-05 ENCOUNTER — Other Ambulatory Visit: Payer: Self-pay

## 2021-10-05 DIAGNOSIS — I6523 Occlusion and stenosis of bilateral carotid arteries: Secondary | ICD-10-CM

## 2021-10-09 NOTE — Progress Notes (Signed)
[facility] [place] ? ?ASSESSMENT / PLAN: ?[name] is a 81 y.o. 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 ? ?Patient counseled patients with asymptomatic peripheral arterial disease or claudication have a 1-2% risk of developing chronic limb threatening ischemia, but a 15-30% risk of mortality in the next 5 years. Intervention should only be considered for medically optimized patients with disabling symptoms.  ? ?Recommend the following which can slow the progression of atherosclerosis and reduce the risk of major adverse cardiac / limb events:  ?Complete cessation from all tobacco products. ?Blood glucose control with goal A1c < 7%. ?Blood pressure control with goal blood pressure < 140/90 mmHg. ?Lipid reduction therapy with goal LDL-C <100 mg/dL (<70 if symptomatic from PAD).  ?Aspirin 81m PO QD.  ?Atorvastatin 40-80mg PO QD (or other "high intensity" statin therapy). ? ?Follow up in 1 year for surveillance ? ?CHIEF COMPLAINT: establish care ? ?HISTORY OF PRESENT ILLNESS: ?Alexis BRettingeris a 81y.o. female previously followed by [facility] for bilateral lower extremity claudication, right upper extremity asymptomatic arterial disease, and carotid artery stenosis.  She is asymptomatic from all of these.  [place].  Reports she can walk without much difficulty.  She has no symptoms typical of ischemic rest pain.  She has no ischemic ulceration.  She denies any symptoms of stroke or TIA.  He has no difficulty using the right upper extremity.  She denies vertebrobasilar symptoms. ? ?10/10/21: Returns to clinic.  She is highly concerned about her right subclavian artery  occlusion.  This is asymptomatic.  She has no rest pain or ischemic ulceration of her right upper extremity and she reports no effort induced dizziness or discomfort in her right upper extremity.  She does not walk fast or far enough to claudicate.  She reports no new neurologic symptoms. ? ?Past Medical History:  ?Diagnosis Date  ? Arthritis   ? Chickenpox   ? Diabetes mellitus without complication ([place]   ? one elevated reading/ no treatment  ? Diverticulitis   ? GI bleed   ? High cholesterol   ? History of blood transfusion   ? Hypertension   ? Renal insufficiency   ? ? ?Past Surgical History:  ?Procedure Laterality Date  ? APPENDECTOMY    ? CHOLECYSTECTOMY    ? ECTOPIC PREGNANCY SURGERY    ? EYE SURGERY    ? bilateral cataracts  ? EYE SURGERY    ? 02/11/2019 repair hole in right eye   ? gallbladder     ? HYSTEROSCOPY WITH D & C N/A 10/26/2016  ? Procedure: DILATATION AND CURETTAGE /HYSTEROSCOPY;  Surgeon: BBenjaman Kindler MD;  Location: [facility] ORS;  Service: Gynecology;  Laterality: N/A;  ? HYSTEROSCOPY WITH D & C N/A 07/07/2018  ? Procedure: DILATATION AND CURETTAGE /HYSTEROSCOPY;  Surgeon: BBenjaman Kindler MD;  Location: [facility] ORS;  Service: Gynecology;  Laterality: N/A;  ? THYROIDECTOMY, PARTIAL    ? ? ?Family History  ?Problem Relation Age of Onset  ? Diabetes Mother   ? Hypertension Mother   ? Stroke Mother   ? Diabetes Other   ? Healthy Father   ? Diabetes Sister   ? Heart disease Sister   ? ? ?Social History  ? ?Socioeconomic History  ? Marital status: Widowed  ?  Spouse name: Not on file  ? Number of children: 1  ? Years of education: 60  ? Highest education level: 12th grade  ?Occupational History  ? Occupation: retired  ?  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 Smoking status: Never  ? Smokeless tobacco: Never  ?Vaping Use  ? Vaping Use: Never used  ?Substance and Sexual Activity  ? Alcohol use: No  ? Drug use: No  ?  Sexual activity: Not Currently  ?Other Topics Concern  ? Not on file  ?Social History Narrative  ? Lives alone   ? From [place]  ? Widowed - was married 3 times starting at age 64   ? 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 ?Social Determinants of Health  ? ?Financial Resource Strain: Not on file  ?Food Insecurity: Not on file  ?Transportation Needs: Not on file  ?Physical Activity: Not on file  ?Stress: Not on file  ?Social Connections: Not on file  ?Intimate Partner Violence: Not on file  ? ? ?Allergies  ?Allergen Reactions  ? Celexa [Citalopram]   ?  Diarrhea upset stomach ?  ? Jardiance [Empagliflozin] Other (See Comments)  ?  Reaction not recalled  ? Norvasc [Amlodipine]   ?  Leg edema  ? Tape Other (See Comments)  ?  Leaves the skin "raw" if left on for a period of time- tolerates paper tape  ? Penicillin V Rash  ? Penicillins Rash  ?  Has patient had a PCN reaction causing immediate rash, facial/tongue/throat swelling, SOB or lightheadedness with hypotension: Yes ?Has patient had a PCN reaction causing severe rash involving mucus membranes or skin necrosis: No ?Has patient had a PCN reaction that required hospitalization No ?Has patient had a PCN reaction occurring within the last 10 years: Yes ?If all of the above answers are "NO", then may proceed with Cephalosporin use. ?  ? ? ?Current Outpatient Medications  ?Medication Sig Dispense Refill  ? ACCU-CHEK GUIDE test strip USE AS INSTRUCTED 3 TIMES DAILY 300 strip 12  ? acetaminophen (TYLENOL) 500 MG tablet Take 500 mg by mouth every 6 (six) hours as needed for mild pain or headache.    ? Ascorbic Acid (VITAMIN C PO) Take 1 tablet by mouth daily.    ? aspirin EC 81 MG tablet Take 81 mg by mouth 2 (two) times a week. Swallow whole.    ? BD INSULIN SYRINGE U/F 30G X 1/2" 0.5 ML MISC USE UP TO 5X PER DAY WITH INSULIN 2X (NPH) AND 3X (REGULAR) 500 each 12  ? blood glucose meter kit and supplies KIT Accu chek, Dx  code E11.65, check 3 times daily 1 each 0  ? cloNIDine (CATAPRES) 0.2 MG tablet Take 0.5 tablets (0.1 mg total) by mouth 2 (two) times daily. 270 tablet 3  ? hydrALAZINE (APRESOLINE) 100 MG tablet Take 1 tablet (100 mg total) by mouth in the morning, at noon, in the evening, and at bedtime. 360 tablet 3  ? insulin NPH Human (NOVOLIN N) 100 UNIT/ML injection INJECT 20 UNITS IN THE MORNING AND THE EVENING WITH A MEAL (Patient taking differently: Inject 20 Units into the skin 2 (two) times daily before a meal.) 40 mL PRN  ? insulin regular (NOVOLIN R) 100 units/mL injection Inject 0-0.08 mLs (0-8 Units total) into the skin 3 (three) times daily before meals. (Patient taking differently: Inject 5-15  Units into the skin See admin instructions. Inject between 5 to 15 units into the skin two to three times a day with meals, PER SLIDING SCALE) 30 mL 11  ? LORazepam (ATIVAN) 0.5 MG tablet Take 1 tablet (0.5 mg total) by mouth daily as needed for anxiety. D/c 0.25 30 tablet 5  ? Multiple Vitamins-Minerals (CENTRUM SILVER 50+WOMEN) TABS Take 1 tablet by mouth daily with breakfast.    ? nebivolol (BYSTOLIC) 2.5 MG tablet Take 1 tablet (2.5 mg total) by mouth daily. 90 tablet 3  ? ondansetron (ZOFRAN) 4 MG tablet Take 1 tablet (4 mg total) by mouth every 6 (six) hours as needed for nausea. (Patient not taking: Reported on 08/22/2021) 20 tablet 0  ? polyethylene glycol powder (GLYCOLAX/MIRALAX) 17 GM/SCOOP powder Take 17 g by mouth daily as needed for moderate constipation or severe constipation. Can take up to 2x per day prn. Mix with 8 ounces of liquid 3350 g 11  ? PYRIDOXINE HCL PO Take 1 tablet by mouth daily.    ? rosuvastatin (CRESTOR) 20 MG tablet TAKE 1 TABLET BY MOUTH EVERY DAY 90 tablet 3  ? spironolactone (ALDACTONE) 25 MG tablet Take 1 tablet (25 mg total) by mouth daily. 90 tablet 3  ? torsemide (DEMADEX) 10 MG tablet Take 1 tablet (10 mg total) by mouth daily. 90 tablet 3  ? ?No current facility-administered  medications for this visit.  ? ? ?REVIEW OF SYSTEMS:  ?'[X]'  denotes positive finding, '[ ]'  denotes negative finding ?Cardiac  Comments:  ?Chest pain or chest pressure:    ?Shortness of breath upon exertion:    ?Short of b

## 2021-10-10 ENCOUNTER — Encounter: Payer: Self-pay | Admitting: Vascular Surgery

## 2021-10-10 ENCOUNTER — Ambulatory Visit (HOSPITAL_COMMUNITY)
Admission: RE | Admit: 2021-10-10 | Discharge: 2021-10-10 | Disposition: A | Discharge status: Home / Self Care | Payer: Medicare Other | Source: Ambulatory Visit | Attending: Vascular Surgery | Admitting: Vascular Surgery

## 2021-10-10 ENCOUNTER — Encounter (HOSPITAL_COMMUNITY): Payer: Medicare Other

## 2021-10-10 ENCOUNTER — Ambulatory Visit: Payer: Medicare Other | Admitting: Vascular Surgery

## 2021-10-10 ENCOUNTER — Ambulatory Visit (INDEPENDENT_AMBULATORY_CARE_PROVIDER_SITE_OTHER): Payer: Medicare Other | Admitting: Vascular Surgery

## 2021-10-10 VITALS — BP 201/65 | HR 76 | Temp 98.6°F | Resp 20 | Ht 63.0 in | Wt 240.0 lb

## 2021-10-10 DIAGNOSIS — I708 Atherosclerosis of other arteries: Secondary | ICD-10-CM | POA: Diagnosis not present

## 2021-10-10 DIAGNOSIS — I6523 Occlusion and stenosis of bilateral carotid arteries: Secondary | ICD-10-CM

## 2021-10-10 DIAGNOSIS — I739 Peripheral vascular disease, unspecified: Secondary | ICD-10-CM

## 2021-10-12 ENCOUNTER — Telehealth: Payer: Self-pay | Admitting: Internal Medicine

## 2021-10-12 NOTE — Telephone Encounter (Signed)
Patient called and is confused on her medications. She would like a phone call for someone to help her. ?

## 2021-10-13 ENCOUNTER — Ambulatory Visit (INDEPENDENT_AMBULATORY_CARE_PROVIDER_SITE_OTHER): Payer: Medicare Other | Admitting: *Deleted

## 2021-10-13 DIAGNOSIS — I5032 Chronic diastolic (congestive) heart failure: Secondary | ICD-10-CM

## 2021-10-13 DIAGNOSIS — E1129 Type 2 diabetes mellitus with other diabetic kidney complication: Secondary | ICD-10-CM

## 2021-10-13 DIAGNOSIS — I10 Essential (primary) hypertension: Secondary | ICD-10-CM

## 2021-10-13 NOTE — Patient Instructions (Addendum)
Visit Information ? ?Thank you for taking time to visit with me today. Please don't hesitate to contact me if I can be of assistance to you before our next scheduled telephone appointment. ? ?Following are the goals we discussed today:  ?Check blood sugar at least 4 times a day ?Check blood sugar if I feel it is too high or too low ?Enter blood sugar readings and medication/insulin into daily log ?Discuss goal A1C with endocrinology  ?Avoid or limit Carbs/Sweets/Sugars ?Take insulins as prescribed (25 Units in am and 15 Units in pm) ?Call office if I gain more than 2 pounds in one day or 5 pounds in one week ?Obtain home scale ?Use salt in moderation ?Watch for swelling in feet, ankles and legs every day; elevate legs when sitting ?Weigh myself daily and write weights in log ?Take log to all provider appointments ?Take BP in Left arm, recording reading for provider review ? ?Our next appointment is by telephone on 6/2 at 1415 ? ?Please call the care guide team at [phone] if you need to cancel or reschedule your appointment.  ? ?If you are experiencing a Mental Health or [place] or need someone to talk to, please call the Suicide and Crisis Lifeline: 988 ?call the [place] National Suicide Prevention Lifeline: 2497476101 or TTY: 514 192 2617 TTY (631)868-1398) to talk to a trained counselor ?call 1-800-273-TALK (toll free, 24 hour hotline) ?call 911  ? ?The patient verbalized understanding of instructions, educational materials, and care plan provided today and declined offer to receive copy of patient instructions, educational materials, and care plan.  ? ?[name] RN, MSN ?RN Care Management Coordinator ?[place] ?[phone] ?[email or web address] ? ?

## 2021-10-13 NOTE — Chronic Care Management (AMB) (Signed)
?Chronic Care Management  ? ?CCM RN Visit Note ? ?10/13/2021 ?Name: Alexis Farmer MRN: [id number] DOB: October 09, 1940 ? ?Subjective: ?Alexis Farmer is a 81 y.o. year old female who is a primary care patient of McLean-Scocuzza, Nino Glow, MD. The care management team was consulted for assistance with disease management and care coordination needs.   ? ?Engaged with patient by telephone for follow up visit in response to provider referral for case management and/or care coordination services.  ? ?Consent to Services:  ?The patient was given information about Chronic Care Management services, agreed to services, and gave verbal consent prior to initiation of services.  Please see initial visit note for detailed documentation.  ? ?Patient agreed to services and verbal consent obtained.  ? ?Assessment: Review of patient past medical history, allergies, medications, health status, including review of consultants reports, laboratory and other test data, was performed as part of comprehensive evaluation and provision of chronic care management services.  ? ?SDOH (Social Determinants of Health) assessments and interventions performed:   ? ?CCM Care Plan ? ?Allergies  ?Allergen Reactions  ? Celexa [Citalopram]   ?  Diarrhea upset stomach ?  ? Jardiance [Empagliflozin] Other (See Comments)  ?  Reaction not recalled  ? Norvasc [Amlodipine]   ?  Leg edema  ? Tape Other (See Comments)  ?  Leaves the skin "raw" if left on for a period of time- tolerates paper tape  ? Penicillin V Rash  ? Penicillins Rash  ?  Has patient had a PCN reaction causing immediate rash, facial/tongue/throat swelling, SOB or lightheadedness with hypotension: Yes ?Has patient had a PCN reaction causing severe rash involving mucus membranes or skin necrosis: No ?Has patient had a PCN reaction that required hospitalization No ?Has patient had a PCN reaction occurring within the last 10 years: Yes ?If all of the above answers are "NO", then may proceed with  Cephalosporin use. ?  ? ? ?Outpatient Encounter Medications as of 10/13/2021  ?Medication Sig Note  ? ACCU-CHEK GUIDE test strip USE AS INSTRUCTED 3 TIMES DAILY   ? acetaminophen (TYLENOL) 500 MG tablet Take 500 mg by mouth every 6 (six) hours as needed for mild pain or headache.   ? Ascorbic Acid (VITAMIN C PO) Take 1 tablet by mouth daily.   ? aspirin EC 81 MG tablet Take 81 mg by mouth 2 (two) times a week. Swallow whole. 08/12/2021: No specific days   ? BD INSULIN SYRINGE U/F 30G X 1/2" 0.5 ML MISC USE UP TO 5X PER DAY WITH INSULIN 2X (NPH) AND 3X (REGULAR)   ? blood glucose meter kit and supplies KIT Accu chek, Dx code E11.65, check 3 times daily   ? cloNIDine (CATAPRES) 0.2 MG tablet Take 0.5 tablets (0.1 mg total) by mouth 2 (two) times daily.   ? hydrALAZINE (APRESOLINE) 100 MG tablet Take 1 tablet (100 mg total) by mouth in the morning, at noon, in the evening, and at bedtime.   ? insulin NPH Human (NOVOLIN N) 100 UNIT/ML injection INJECT 20 UNITS IN THE MORNING AND THE EVENING WITH A MEAL (Patient taking differently: Inject 20 Units into the skin 2 (two) times daily before a meal.)   ? insulin regular (NOVOLIN R) 100 units/mL injection Inject 0-0.08 mLs (0-8 Units total) into the skin 3 (three) times daily before meals. (Patient taking differently: Inject 5-15 Units into the skin See admin instructions. Inject between 5 to 15 units into the skin two to three times a day with meals,  PER SLIDING SCALE)   ? LORazepam (ATIVAN) 0.5 MG tablet Take 1 tablet (0.5 mg total) by mouth daily as needed for anxiety. D/c 0.25   ? Multiple Vitamins-Minerals (CENTRUM SILVER 50+WOMEN) TABS Take 1 tablet by mouth daily with breakfast.   ? nebivolol (BYSTOLIC) 2.5 MG tablet Take 1 tablet (2.5 mg total) by mouth daily.   ? ondansetron (ZOFRAN) 4 MG tablet Take 1 tablet (4 mg total) by mouth every 6 (six) hours as needed for nausea.   ? polyethylene glycol powder (GLYCOLAX/MIRALAX) 17 GM/SCOOP powder Take 17 g by mouth daily as  needed for moderate constipation or severe constipation. Can take up to 2x per day prn. Mix with 8 ounces of liquid   ? PYRIDOXINE HCL PO Take 1 tablet by mouth daily.   ? rosuvastatin (CRESTOR) 20 MG tablet TAKE 1 TABLET BY MOUTH EVERY DAY   ? spironolactone (ALDACTONE) 25 MG tablet Take 1 tablet (25 mg total) by mouth daily.   ? torsemide (DEMADEX) 10 MG tablet Take 1 tablet (10 mg total) by mouth daily.   ? ?No facility-administered encounter medications on file as of 10/13/2021.  ? ? ?Patient Active Problem List  ? Diagnosis Date Noted  ? Ileus ([place]) 08/22/2021  ? Gastroenteritis 08/12/2021  ? Lymphedema 04/19/2021  ? Atherosclerosis of aorta ([place]) 01/16/2021  ? 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/16/2021  ? Spinal stenosis of lumbar region 01/16/2021  ? Stage 3a chronic kidney disease ([place]) 01/15/2021  ? Atypical chest pain 01/15/2021  ? Panic attack 01/15/2021  ? Hypertensive urgency 12/15/2020  ? Palpitations 12/15/2020  ? Elevated troponin 12/15/2020  ? Other chronic pain 08/25/2020  ? Hypertension associated with diabetes ([place]) 08/25/2020  ? 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  ? Pure hypercholesterolemia 08/10/2020  ? 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 Retinopathy 05/30/2020  ? Primary hypertension 03/25/2020  ? Multinodular goiter 02/04/2020  ? Peripheral edema 12/22/2019  ? Proteinuria 12/22/2019  ? Constipation 11/17/2019  ? Recurrent falls 08/13/2019  ? Physical deconditioning 08/13/2019  ? Abdominal aortic atherosclerosis ([place]) 07/24/2019  ? Morbid obesity with BMI of 45.0-49.9, adult ([place]) 06/09/2019  ? Hypercalcemia 03/18/2019  ? Round hole, unspecified eye 02/06/2019  ? 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 Disorder of rotator cuff 06/24/2018  ? Abnormal gait 03/26/2018  ? Fall 03/26/2018  ? Arthritis 03/26/2018  ? Thyroid nodule 01/20/2018  ? Dysuria 03/22/2017  ? Cervical spondylosis with radiculopathy 01/26/2017   ? Radiculopathy due to cervical spondylosis 01/26/2017  ? Rectal hemorrhage 07/29/2016  ? Carotid artery stenosis 07/19/2016  ? PAD (peripheral artery disease) ([place]) 07/19/2016  ? Swelling of lower leg 07/19/2016  ? Bradycardia 07/17/2016  ? Postmenopausal bleeding 07/17/2016  ? Chronic diastolic CHF (congestive heart failure) ([place]) 05/31/2016  ? Cough 01/25/2016  ? Chronic fatigue 11/15/2015  ? Depression, recurrent ([place]) 10/13/2015  ? Osteoarthritis 10/13/2015  ? Vertigo 10/13/2015  ? Anxiety 09/13/2015  ? Gastrointestinal hemorrhage 08/03/2015  ? Hyperglycemia due to type 2 diabetes mellitus ([place]) 03/11/2015  ? Mixed hyperlipidemia 08/26/2014  ? Essential hypertension 04/08/2014  ? Diabetes mellitus type II, uncontrolled 04/08/2014  ? Chronic kidney disease, stage 2 (mild) 04/08/2014  ? Edema of foot 04/08/2014  ? Morbid obesity ([place]) 04/08/2014  ? Low back pain 04/08/2014  ? Diabetic polyneuropathy ([place]) 04/08/2014  ? Type 2 diabetes mellitus with other diabetic kidney complication ([place]) 12/16/1599  ? ? ?Conditions to be addressed/monitored:CHF, HTN, and DMII ? ?  Care Plan : RNCM  Diabetes Type 2 & Heart Failure (Adult)  ?Updates made by Leona Singleton, RN since 10/13/2021 12:00 AM  ?  ? ?Problem: Glycemic & Heart Failure Management (Diabetes, Type 2)   ?Priority: Medium  ?  ? ?Long-Range Goal: Patient will verbalize decrease in Hgb A1C by 0.3 points within the next 90 days   ?Start Date: 09/28/2020  ?Expected End Date: 08/15/2021  ?Recent Progress: Not on track  ?Priority: Medium  ?Note:   ?Objective:  ?Lab Results  ?Component Value Date  ? HGBA1C 8.8 (H) 08/13/2021  ?Current Barriers:  ?Knowledge Deficits related to basic Diabetes pathophysiology and self care/management as evidenced by continued elevated blood sugars Lack of self motivation to self management of diabetes ?Knowledge deficit related to basic heart failure pathophysiology and self care management as evidenced by not knowing heart failure  diagnosis and treatment/self care management.  ?3/29--recent hospitalization for ileus/abdominal pain.  Patient stating she feels much better.  Taking  Miralax as needed.  Last bowel movement was this morning.

## 2021-10-15 DIAGNOSIS — E1129 Type 2 diabetes mellitus with other diabetic kidney complication: Secondary | ICD-10-CM

## 2021-10-15 DIAGNOSIS — I10 Essential (primary) hypertension: Secondary | ICD-10-CM | POA: Diagnosis not present

## 2021-10-15 DIAGNOSIS — I5032 Chronic diastolic (congestive) heart failure: Secondary | ICD-10-CM | POA: Diagnosis not present

## 2021-10-24 ENCOUNTER — Ambulatory Visit (INDEPENDENT_AMBULATORY_CARE_PROVIDER_SITE_OTHER): Payer: Medicare Other | Admitting: Internal Medicine

## 2021-10-24 ENCOUNTER — Telehealth: Payer: Self-pay | Admitting: Internal Medicine

## 2021-10-24 ENCOUNTER — Encounter: Payer: Self-pay | Admitting: Internal Medicine

## 2021-10-24 VITALS — BP 140/60 | HR 69 | Temp 98.8°F | Resp 14 | Ht 63.0 in | Wt 241.2 lb

## 2021-10-24 DIAGNOSIS — L304 Erythema intertrigo: Secondary | ICD-10-CM

## 2021-10-24 DIAGNOSIS — L821 Other seborrheic keratosis: Secondary | ICD-10-CM | POA: Diagnosis not present

## 2021-10-24 DIAGNOSIS — L729 Follicular cyst of the skin and subcutaneous tissue, unspecified: Secondary | ICD-10-CM | POA: Diagnosis not present

## 2021-10-24 DIAGNOSIS — I10 Essential (primary) hypertension: Secondary | ICD-10-CM

## 2021-10-24 DIAGNOSIS — L700 Acne vulgaris: Secondary | ICD-10-CM | POA: Insufficient documentation

## 2021-10-24 HISTORY — DX: Follicular cyst of the skin and subcutaneous tissue, unspecified: L72.9

## 2021-10-24 HISTORY — DX: Acne vulgaris: L70.0

## 2021-10-24 MED ORDER — BLOOD PRESSURE KIT: 1.0000 | PACK | Freq: Every day | 0 refills | Status: AC

## 2021-10-24 MED ORDER — NYSTATIN 100000 UNIT/GM EX POWD: 1.0000 "application " | Freq: Two times a day (BID) | CUTANEOUS | 11 refills | Status: DC

## 2021-10-24 NOTE — Progress Notes (Signed)
Chief Complaint  ?Patient presents with  ? Acute Visit  ?  Pt c/o spot on mid to upper back between buttock area which is painful. She has a mole underneath stomach she would liked looked at had a odor in the past.   ? ?F/u with grandaughter [name]  ?1. Spot in mid low back tender painful not itchy and odor in skin folds of stomach and left groin area skin lesion, cyst to left shoulder and blackhead to mid back and another skin lesion to mid back  ?2. Htn elevated at home sbp 180s on current meds given Rx for new BP cuff she states taking her meds but thinks is her BP cuff  ? ?Review of Systems  ?Constitutional:  Negative for weight loss.  ?HENT:  Negative for hearing loss.   ?Eyes:  Negative for blurred vision.  ?Respiratory:  Negative for shortness of breath.   ?Cardiovascular:  Negative for chest pain.  ?Gastrointestinal:  Negative for abdominal pain and blood in stool.  ?Genitourinary:  Negative for dysuria.  ?Musculoskeletal:  Negative for falls and joint pain.  ?Skin:  Negative for rash.  ?Neurological:  Negative for headaches.  ?Psychiatric/Behavioral:  Negative for depression.   ?Past Medical History:  ?Diagnosis Date  ? Arthritis   ? Chickenpox   ? Diabetes mellitus without complication ([place])   ? one elevated reading/ no treatment  ? Diverticulitis   ? GI bleed   ? High cholesterol   ? History of blood transfusion   ? Hypertension   ? Renal insufficiency   ? ?Past Surgical History:  ?Procedure Laterality Date  ? APPENDECTOMY    ? CHOLECYSTECTOMY    ? ECTOPIC PREGNANCY SURGERY    ? EYE SURGERY    ? bilateral cataracts  ? EYE SURGERY    ? 02/11/2019 repair hole in right eye   ? gallbladder     ? HYSTEROSCOPY WITH D & C N/A 10/26/2016  ? Procedure: DILATATION AND CURETTAGE /HYSTEROSCOPY;  Surgeon: Benjaman Kindler, MD;  Location: [facility] ORS;  Service: Gynecology;  Laterality: N/A;  ? HYSTEROSCOPY WITH D & C N/A 07/07/2018  ? Procedure: DILATATION AND CURETTAGE /HYSTEROSCOPY;  Surgeon: Benjaman Kindler, MD;   Location: [facility] OR[facility];  Service: Gynecology;  Laterality: N/A;  ? THYROIDECTOMY, PARTIAL    ? ?Family History  ?Problem Relation Age of Onset  ? Diabetes Mother   ? Hypertension Mother   ? Stroke Mother   ? Diabetes Other   ? Healthy Father   ? Diabetes Sister   ? Heart disease Sister   ? ?Social History  ? ?Socioeconomic History  ? Marital status: Widowed  ?  Spouse name: Not on file  ? Number of children: 1  ? Years of education: 95  ? Highest education level: 12th grade  ?Occupational History  ? Occupation: retired  ?  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 Smoking status: Never  ? Smokeless tobacco: Never  ?Vaping Use  ? Vaping Use: Never used  ?Substance and Sexual Activity  ? Alcohol use: No  ? Drug use: No  ? Sexual activity: Not Currently  ?Other Topics Concern  ? Not on file  ?Social History Narrative  ? Lives alone   ? From [place]  ? Widowed - was married 3 times starting at age 2   ? 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 ?Social  Determinants of Health  ? ?Financial Resource Strain: Not on file  ?Food Insecurity: Not on file  ?Transportation Needs: Not on file  ?Physical Activity: Not on file  ?Stress: Not on file  ?Social Connections: Not on file  ?Intimate Partner Violence: Not on file  ? ?Current Meds  ?Medication Sig  ? ACCU-CHEK GUIDE test strip USE AS INSTRUCTED 3 TIMES DAILY  ? acetaminophen (TYLENOL) 500 MG tablet Take 500 mg by mouth every 6 (six) hours as needed for mild pain or headache.  ? Ascorbic Acid (VITAMIN C PO) Take 1 tablet by mouth daily.  ? aspirin EC 81 MG tablet Take 81 mg by mouth 2 (two) times a week. Swallow whole.  ? BD INSULIN SYRINGE U/F 30G X 1/2" 0.5 ML MISC USE UP TO 5X PER DAY WITH INSULIN 2X (NPH) AND 3X (REGULAR)  ? blood glucose meter kit and supplies KIT Accu chek, Dx code E11.65, check 3 times daily  ? Blood Pressure KIT 1 Device  by Does not apply route daily.  ? cloNIDine (CATAPRES) 0.2 MG tablet Take 0.5 tablets (0.1 mg total) by mouth 2 (two) times daily.  ? hydrALAZINE (APRESOLINE) 100 MG tablet Take 1 tablet (100 mg total) by mouth in the morning, at noon, in the evening, and at bedtime.  ? insulin NPH Human (NOVOLIN N) 100 UNIT/ML injection INJECT 20 UNITS IN THE MORNING AND THE EVENING WITH A MEAL (Patient taking differently: Inject 20 Units into the skin 2 (two) times daily before a meal.)  ? insulin regular (NOVOLIN R) 100 units/mL injection Inject 0-0.08 mLs (0-8 Units total) into the skin 3 (three) times daily before meals. (Patient taking differently: Inject 5-15 Units into the skin See admin instructions. Inject between 5 to 15 units into the skin two to three times a day with meals, PER SLIDING SCALE)  ? LORazepam (ATIVAN) 0.5 MG tablet Take 1 tablet (0.5 mg total) by mouth daily as needed for anxiety. D/c 0.25  ? Multiple Vitamins-Minerals (CENTRUM SILVER 50+WOMEN) TABS Take 1 tablet by mouth daily with breakfast.  ? nebivolol (BYSTOLIC) 2.5 MG tablet Take 1 tablet (2.5 mg total) by mouth daily.  ? nystatin (MYCOSTATIN/NYSTOP) powder Apply 1 application. topically 2 (two) times daily. Under abdomen  ? ondansetron (ZOFRAN) 4 MG tablet Take 1 tablet (4 mg total) by mouth every 6 (six) hours as needed for nausea.  ? polyethylene glycol powder (GLYCOLAX/MIRALAX) 17 GM/SCOOP powder Take 17 g by mouth daily as needed for moderate constipation or severe constipation. Can take up to 2x per day prn. Mix with 8 ounces of liquid  ? PYRIDOXINE HCL PO Take 1 tablet by mouth daily.  ? rosuvastatin (CRESTOR) 20 MG tablet TAKE 1 TABLET BY MOUTH EVERY DAY  ? spironolactone (ALDACTONE) 25 MG tablet Take 1 tablet (25 mg total) by mouth daily.  ? torsemide (DEMADEX) 10 MG tablet Take 1 tablet (10 mg total) by mouth daily.  ? ?Allergies  ?Allergen Reactions  ? Celexa [Citalopram]   ?  Diarrhea upset stomach ?  ? Jardiance [Empagliflozin] Other  (See Comments)  ?  Reaction not recalled  ? Norvasc [Amlodipine]   ?  Leg edema  ? Tape Other (See Comments)  ?  Leaves the skin "raw" if left on for a period of time- tolerates paper tape  ? Penicillin V Rash  ? Penicillins Rash  ?  Has patient had a PCN reaction causing immediate rash, facial/tongue/throat swelling, SOB or lightheadedness with hypotension: Yes ?Has patient  had a PCN reaction causing severe rash involving mucus membranes or skin necrosis: No ?Has patient had a PCN reaction that required hospitalization No ?Has patient had a PCN reaction occurring within the last 10 years: Yes ?If all of the above answers are "NO", then may proceed with Cephalosporin use. ?  ? ?Recent Results (from the past 2160 hour(s))  ?Urinalysis, Routine w reflex microscopic     Status: Abnormal  ? Collection Time: 08/12/21 10:03 AM  ?Result Value Ref Range  ? Color, Urine YELLOW YELLOW  ? APPearance HAZY (A) CLEAR  ? Specific Gravity, Urine 1.009 1.005 - 1.030  ? pH 7.0 5.0 - 8.0  ? Glucose, UA 150 (A) NEGATIVE mg/dL  ? Hgb urine dipstick SMALL (A) NEGATIVE  ? Bilirubin Urine NEGATIVE NEGATIVE  ? Ketones, ur NEGATIVE NEGATIVE mg/dL  ? Protein, ur 100 (A) NEGATIVE mg/dL  ? Nitrite NEGATIVE NEGATIVE  ? Leukocytes,Ua TRACE (A) NEGATIVE  ? RBC / HPF 6-10 0 - 5 RBC/hpf  ? WBC, UA 0-5 0 - 5 WBC/hpf  ? Bacteria, UA RARE (A) NONE SEEN  ? Squamous Epithelial / LPF 0-5 0 - 5  ?  Comment: Performed at [facility], [place] [address] [place], [place] [address]  ?CBC with Differential     Status: None  ? Collection Time: 08/12/21 10:09 AM  ?Result Value Ref Range  ? WBC 6.9 4.0 - 10.5 K/uL  ? RBC 4.76 3.87 - 5.11 MIL/uL  ? Hemoglobin 13.8 12.0 - 15.0 g/dL  ? HCT 43.1 36.0 - 46.0 %  ? MCV 90.5 80.0 - 100.0 fL  ? MCH 29.0 26.0 - 34.0 pg  ? MCHC 32.0 30.0 - 36.0 g/dL  ? RDW 13.4 11.5 - 15.5 %  ? Platelets 240 150 - 400 K/uL  ? nRBC 0.0 0.0 - 0.2 %  ? Neutrophils Relative % 48 %  ? Neutro Abs 3.3 1.7 - 7.7 K/uL  ? Lymphocytes Relative 39 %   ? Lymphs Abs 2.7 0.7 - 4.0 K/uL  ? Monocytes Relative 12 %  ? Monocytes Absolute 0.8 0.1 - 1.0 K/uL  ? Eosinophils Relative 1 %  ? Eosinophils Absolute 0.1 0.0 - 0.5 K/uL  ? Basophils Relative 0 %  ? Baso

## 2021-10-24 NOTE — Telephone Encounter (Signed)
Entered in error

## 2021-10-24 NOTE — Patient Instructions (Addendum)
Dr. Lanier Prude  ? ?Address: [address] [place], [place] [address] ?Hours:  ?Open ? Closes 5?PM ?Products and Services: [email or web address] ?Phone: [phone] ? ?Skin Tag, Adult ? ?A skin tag (acrochordon) is a soft, extra growth of skin. Most skin tags are skin-colored and rarely bigger than a pencil eraser. They commonly form in areas where there is frequent rubbing, or friction, on the skin. This may be where there are folds in the skin, such as the eyelids, neck, armpit, or groin. Skin tags are not dangerous, and they do not spread from person to person (are not contagious). ?You may have one skin tag or several. Skin tags do not require treatment. However, your health care provider may recommend removal of a skin tag if it: ?Gets irritated from clothing or jewelry. ?Bleeds. ?Is visible and unsightly. ?What are the causes? ?This condition is linked with: ?Increasing age. ?Pregnancy. ?Diabetes. ?Obesity. ?What are the signs or symptoms? ?Skin tags usually do not cause symptoms unless they get irritated by items touching your skin, such as clothing or jewelry. When this happens, you may have pain, itching, or bleeding. ?How is this diagnosed? ?This condition is diagnosed with an evaluation from your health care provider. No testing is needed for diagnosis. ?How is this treated? ?Treatment for this condition depends on whether you have symptoms. If a skin tag needs to be removed, your health care provider can remove it with: ?A simple surgical procedure using scissors. ?A procedure that involves freezing your skin tag with a gas in liquid form (liquid nitrogen). ?A procedure that uses heat to destroy your skin tag (electrodessication). ?Your health care provider may also remove your skin tag if it is visible or unsightly, ?Follow these instructions at home: ?Watch for any changes in your skin tag. A normal skin tag does not require any other special care at home. ?Take over-the-counter and prescription  medicines only as told by your health care provider. ?Keep all follow-up visits as told by your health care provider. This is important. ?Contact a health care provider if: ?You have a skin tag that: ?Becomes painful. ?Changes color. ?Bleeds. ?Swells. ?Summary ?Skin tags are soft, extra growths of skin found in areas of frequent rubbing or friction. ?Skin tags usually do not cause symptoms. If symptoms occur, you may have pain, itching, or bleeding. ?If your skin tag causes symptoms or is unsightly, your health care provider can remove it. ?This information is not intended to replace advice given to you by your health care provider. Make sure you discuss any questions you have with your health care provider. ?Document Revised: 04/06/2019 Document Reviewed: 04/06/2019 ?Elsevier Patient Education ? [place]. ? ?Seborrheic Keratosis ?A seborrheic keratosis is a common, noncancerous (benign) skin growth. These growths are velvety, waxy, rough, tan, brown, or black spots that appear on the skin. These skin growths can be flat or raised, and scaly. ?What are the causes? ?The cause of this condition is not known. ?What increases the risk? ?You are more likely to develop this condition if you: ?Have a family history of seborrheic keratosis. ?Are 50 or older. ?Are pregnant. ?Have had estrogen replacement therapy. ?What are the signs or symptoms? ?Symptoms of this condition include growths on the face, chest, shoulders, back, or other areas. These growths: ?Are usually painless, but may become irritated and itchy. ?Can be yellow, brown, black, or other colors. ?Are slightly raised or have a flat surface. ?Are sometimes rough or wart-like in texture. ?Are often velvety or  waxy on the surface. ?Are round or oval-shaped. ?Often occur in groups, but may occur as a single growth. ?How is this diagnosed? ?This condition is diagnosed with a medical history and physical exam. ?A sample of the growth may be tested (skin  biopsy). ?You may need to see a skin specialist (dermatologist). ?How is this treated? ?Treatment is not usually needed for this condition, unless the growths are irritated or bleed often. ?You may also choose to have the growths removed if you do not like their appearance. ?Most commonly, these growths are treated with a procedure in which liquid nitrogen is applied to "freeze" off the growth (cryosurgery). ?They may also be burned off with electricity (electrocautery) or removed by scraping (curettage). ?Follow these instructions at home: ?Watch your growth for any changes. ?Keep all follow-up visits as told by your health care provider. This is important. ?Do not scratch or pick at the growth or growths. This can cause them to become irritated or infected. ?Contact a health care provider if: ?You suddenly have many new growths. ?Your growth bleeds, itches, or hurts. ?Your growth suddenly becomes larger or changes color. ?Summary ?A seborrheic keratosis is a common, noncancerous (benign) skin growth. ?Treatment is not usually needed for this condition, unless the growths are irritated or bleed often. ?Watch your growth for any changes. ?Contact a health care provider if you suddenly have many new growths or your growth suddenly becomes larger or changes color. ?Keep all follow-up visits as told by your health care provider. This is important. ?This information is not intended to replace advice given to you by your health care provider. Make sure you discuss any questions you have with your health care provider. ?Document Revised: 03/29/2021 Document Reviewed: 03/29/2021 ?Elsevier Patient Education ? [place]. ? ?Intertrigo ?Intertrigo is skin irritation or inflammation (dermatitis) that occurs when folds of skin rub together. The irritation can cause a rash and make skin raw and itchy. This condition most commonly occurs in the skin folds of these areas: ?Toes. ?Armpits. ?Groin. ?Under the belly. ?Under  the breasts. ?Buttocks. ?Intertrigo is not passed from person to person (is not contagious). ?What are the causes? ?This condition is caused by heat, moisture, rubbing (friction), and not enough air circulation. The condition can be made worse by: ?Sweat. ?Bacteria. ?A fungus, such as yeast. ?What increases the risk? ?This condition is more likely to occur if you have moisture in your skin folds. You are more likely to develop this condition if you: ?Have diabetes. ?Are overweight. ?Are not able to move around or are not active. ?Live in a warm and moist climate. ?Wear splints, braces, or other medical devices. ?Are not able to control your bowels or bladder (have incontinence). ?What are the signs or symptoms? ?Symptoms of this condition include: ?A pink or red skin rash in the skin fold or near the skin fold. ?Raw or scaly skin. ?Itchiness. ?A burning feeling. ?Bleeding. ?Leaking fluid. ?A bad smell. ?How is this diagnosed? ?This condition is diagnosed with a medical history and physical exam. You may also have a skin swab to test for bacteria or a fungus. ?How is this treated? ?This condition may be treated by: ?Cleaning and drying your skin. ?Taking an antibiotic medicine or using an antibiotic skin cream for a bacterial infection. ?Using an antifungal cream on your skin or taking pills for an infection that was caused by a fungus, such as yeast. ?Using a steroid ointment to relieve itchiness and irritation. ?Separating the skin  fold with a clean cotton cloth to absorb moisture and allow air to flow into the area. ?Follow these instructions at home: ?Keep the affected area clean and dry. ?Do not scratch your skin. ?Stay in a cool environment as much as possible. Use an air conditioner or fan, if available. ?Apply over-the-counter and prescription medicines only as told by your health care provider. ?If you were prescribed an antibiotic medicine, use it as told by your health care provider. Do not stop using the  antibiotic even if your condition improves. ?Keep all follow-up visits as told by your health care provider. This is important. ?How is this prevented? ? ?Maintain a healthy weight. ?Take care of your feet,

## 2021-10-25 ENCOUNTER — Ambulatory Visit: Payer: Medicare Other | Admitting: Internal Medicine

## 2021-10-30 ENCOUNTER — Other Ambulatory Visit: Payer: Medicare Other

## 2021-11-06 ENCOUNTER — Ambulatory Visit: Payer: Medicare Other

## 2021-11-06 DIAGNOSIS — M2141 Flat foot [pes planus] (acquired), right foot: Secondary | ICD-10-CM

## 2021-11-06 DIAGNOSIS — E1142 Type 2 diabetes mellitus with diabetic polyneuropathy: Secondary | ICD-10-CM

## 2021-11-06 NOTE — Progress Notes (Signed)
SITUATION Reason for Consult: Evaluation for Prefabricated Diabetic Shoes and Custom Diabetic Inserts. Patient / Caregiver Report: Patient would like well fitting shoes  OBJECTIVE DATA: Patient History / Diagnosis:    ICD-10-CM   1. Diabetic peripheral neuropathy associated with type 2 diabetes mellitus (HCC)  E11.42     2. Pes planus of both feet  M21.41    M21.42       Physician Treating Diabetes:  Mee Hives MD - Jefm Bryant  Current or Previous Devices:   Historical user  In-Person Foot Examination: Ulcers & Callousing:   None Deformities:    Pes planus Sensation:    Compromised  Shoe Size:     12XW  ORTHOTIC RECOMMENDATION Recommended Devices: - 1x pair prefabricated PDAC approved diabetic shoes; Patient Selected Alexis Farmer 849 Size 12XW - 3x pair custom-to-patient PDAC approved vacuum formed diabetic insoles.  GOALS OF SHOES AND INSOLES - Reduce shear and pressure - Reduce / Prevent callus formation - Reduce / Prevent ulceration - Protect the fragile healing compromised diabetic foot.  Patient would benefit from diabetic shoes and inserts as patient has diabetes mellitus and the patient has one or more of the following conditions: - History of partial or complete amputation of the foot - History of previous foot ulceration. - History of pre-ulcerative callus - Peripheral neuropathy with evidence of callus formation - Foot deformity - Poor circulation  ACTIONS PERFORMED Potential out of pocket cost was communicated to patient. Patient understood and consented to measurement and casting. Patient was casted for insoles via crush box and measured for shoes via brannock device. Procedure was explained and patient tolerated procedure well. All questions were answered and concerns addressed. CMN Submitted to treating physician. Casts were shipped to central fabrication for HOLD until Certificate of Medical Necessity or otherwise necessary authorization from  insurance is obtained.  PLAN Shoes are to be ordered and casts released from hold once all appropriate paperwork is complete. Patient is to be contacted and scheduled for fitting once shoes and insoles have been fabricated and received.

## 2021-11-17 ENCOUNTER — Ambulatory Visit: Payer: Medicare Other | Admitting: Podiatry

## 2021-11-17 ENCOUNTER — Ambulatory Visit: Payer: Medicare Other | Admitting: *Deleted

## 2021-11-17 DIAGNOSIS — I10 Essential (primary) hypertension: Secondary | ICD-10-CM

## 2021-11-17 DIAGNOSIS — E1129 Type 2 diabetes mellitus with other diabetic kidney complication: Secondary | ICD-10-CM

## 2021-11-17 NOTE — Chronic Care Management (AMB) (Signed)
  Care Management   Follow Up Note   11/17/2021 Name: Alexis Farmer MRN: [id number] DOB: Nov 13, 1940   Referred by: McLean-Scocuzza, Nino Glow, MD Reason for referral : Chronic Care Management (DM, HTN)  Successful  telephone outreach to patient for follow up.  States she does not feel well and request call back another day and time.  Follow Up Plan: The care management team will reach out to the patient again over the next 30 days.  Per patient request.  [name] RN, MSN RN Care Management Coordinator [place] [phone] [email or web address]

## 2021-11-27 ENCOUNTER — Ambulatory Visit (INDEPENDENT_AMBULATORY_CARE_PROVIDER_SITE_OTHER): Payer: Medicare Other

## 2021-11-27 VITALS — Ht 63.0 in | Wt 241.0 lb

## 2021-11-27 DIAGNOSIS — Z0000 Encounter for general adult medical examination without abnormal findings: Secondary | ICD-10-CM | POA: Diagnosis not present

## 2021-11-27 NOTE — Progress Notes (Signed)
Subjective:   Adamaris Wailes is a 81 y.o. female who presents for Medicare Annual (Subsequent) preventive examination.  Review of Systems    No ROS.  Medicare Wellness Virtual Visit.  Visual/audio telehealth visit, UTA vital signs.   See social history for additional risk factors.   Cardiac Risk Factors include: advanced age (>38mn, >>72women)     Objective:    Today's Vitals   11/27/21 1034  Weight: 241 lb (109.3 kg)  Height: _0  (1.6 m)   Body mass index is 42.69 kg/m.     11/27/2021   10:45 AM 08/12/2021   10:04 AM 12/15/2020    4:00 PM 11/24/2020   10:42 AM 09/28/2020    9:19 AM 05/05/2020    3:39 PM 01/21/2020    5:06 PM  Advanced Directives  Does Patient Have a Medical Advance Directive? _1  No No  Would patient like information on creating a medical advance directive? No - Patient declined  No - Patient declined No - Patient declined No - Patient declined No - Patient declined No - Patient declined    Current Medications (verified) Outpatient Encounter Medications as of 11/27/2021  Medication Sig   ACCU-CHEK GUIDE test strip USE AS INSTRUCTED 3 TIMES DAILY   acetaminophen (TYLENOL) 500 MG tablet Take 500 mg by mouth every 6 (six) hours as needed for mild pain or headache.   Ascorbic Acid (VITAMIN C PO) Take 1 tablet by mouth daily.   aspirin EC 81 MG tablet Take 81 mg by mouth 2 (two) times a week. Swallow whole.   BD INSULIN SYRINGE U/F 30G X 1/2" 0.5 ML MISC USE UP TO 5X PER DAY WITH INSULIN 2X (NPH) AND 3X (REGULAR)   blood glucose meter kit and supplies KIT Accu chek, Dx code E11.65, check 3 times daily   Blood Pressure KIT 1 Device by Does not apply route daily.   cloNIDine (CATAPRES) 0.2 MG tablet Take 0.5 tablets (0.1 mg total) by mouth 2 (two) times daily.   hydrALAZINE (APRESOLINE) 100 MG tablet Take 1 tablet (100 mg total) by mouth in the morning, at noon, in the evening, and at bedtime.   insulin NPH Human (NOVOLIN N) 100 UNIT/ML injection  INJECT 20 UNITS IN THE MORNING AND THE EVENING WITH A MEAL (Patient taking differently: Inject 20 Units into the skin 2 (two) times daily before a meal.)   insulin regular (NOVOLIN R) 100 units/mL injection Inject 0-0.08 mLs (0-8 Units total) into the skin 3 (three) times daily before meals. (Patient taking differently: Inject 5-15 Units into the skin See admin instructions. Inject between 5 to 15 units into the skin two to three times a day with meals, PER SLIDING SCALE)   LORazepam (ATIVAN) 0.5 MG tablet Take 1 tablet (0.5 mg total) by mouth daily as needed for anxiety. D/c 0.25   Multiple Vitamins-Minerals (CENTRUM SILVER 50+WOMEN) TABS Take 1 tablet by mouth daily with breakfast.   nebivolol (BYSTOLIC) 2.5 MG tablet Take 1 tablet (2.5 mg total) by mouth daily.   nystatin (MYCOSTATIN/NYSTOP) powder Apply 1 application. topically 2 (two) times daily. Under abdomen   ondansetron (ZOFRAN) 4 MG tablet Take 1 tablet (4 mg total) by mouth every 6 (six) hours as needed for nausea.   polyethylene glycol powder (GLYCOLAX/MIRALAX) 17 GM/SCOOP powder Take 17 g by mouth daily as needed for moderate constipation or severe constipation. Can take up to 2x per day prn. Mix with 8 ounces of liquid  PYRIDOXINE HCL PO Take 1 tablet by mouth daily.   rosuvastatin (CRESTOR) 20 MG tablet TAKE 1 TABLET BY MOUTH EVERY DAY   spironolactone (ALDACTONE) 25 MG tablet Take 1 tablet (25 mg total) by mouth daily.   torsemide (DEMADEX) 10 MG tablet Take 1 tablet (10 mg total) by mouth daily.   No facility-administered encounter medications on file as of 11/27/2021.    Allergies (verified) Celexa [citalopram], Jardiance [empagliflozin], Norvasc [amlodipine], Tape, Penicillin v, and Penicillins   History: Past Medical History:  Diagnosis Date   Arthritis    Chickenpox    Diabetes mellitus without complication ([place])    one elevated reading/ no treatment   Diverticulitis    GI bleed    High cholesterol    History of  blood transfusion    Hypertension    Renal insufficiency    Past Surgical History:  Procedure Laterality Date   APPENDECTOMY     CHOLECYSTECTOMY     ECTOPIC PREGNANCY SURGERY     EYE SURGERY     bilateral cataracts   EYE SURGERY     02/11/2019 repair hole in right eye    gallbladder      HYSTEROSCOPY WITH D & C N/A 10/26/2016   Procedure: DILATATION AND CURETTAGE /HYSTEROSCOPY;  Surgeon: Benjaman Kindler, MD;  Location: [facility] ORS;  Service: Gynecology;  Laterality: N/A;   HYSTEROSCOPY WITH D & C N/A 07/07/2018   Procedure: DILATATION AND CURETTAGE /HYSTEROSCOPY;  Surgeon: Benjaman Kindler, MD;  Location: [facility] ORS;  Service: Gynecology;  Laterality: N/A;   THYROIDECTOMY, PARTIAL     Family History  Problem Relation Age of Onset   Diabetes Mother    Hypertension Mother    Stroke Mother    Diabetes Other    Healthy Father    Diabetes Sister    Heart disease Sister    Social History   Socioeconomic History   Marital status: Widowed    Spouse name: Not on file   Number of children: 1   Years of education: 67   Highest education level: 12th grade  Occupational History   Occupation: retired    Comment: hx of Pharmacist, hospital, Secretary/administrator, 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  Tobacco Use   Smoking status: Never   Smokeless tobacco: Never  Vaping Use   Vaping Use: Never used  Substance and Sexual Activity   Alcohol use: No   Drug use: No   Sexual activity: Not Currently  Other Topics Concern   Not on file  Social History Narrative   Lives alone    From [place]   Widowed - was married 3 times starting at age 57    hx of seamtress, 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   Social Determinants of Health   Financial Resource Strain: [place]  (09/28/2020)   Overall Financial Resource Strain (CARDIA)    Difficulty of Paying Living Expenses: Not hard at all  Food Insecurity: No Food Insecurity (09/28/2020)   Hunger  Vital Sign    Worried About Running Out of Food in the Last Year: Never true    [place] of Food in the Last Year: Never true  Recent Concern: Food Insecurity - Food Insecurity Present (08/01/2020)   Hunger Vital Sign    Worried About Running Out of Food in the Last Year: Sometimes true    Ran Out of Food in the Last Year: Never true  Transportation Needs: No Transportation Needs (09/28/2020)  PRAPARE - Hydrologist (Medical): No    Lack of Transportation (Non-Medical): No  Physical Activity: Inactive (05/05/2020)   Exercise Vital Sign    Days of Exercise per Week: 0 days    Minutes of Exercise per Session: 0 min  Stress: Stress Concern Present (08/23/2020)   [place]    Feeling of Stress : To some extent  Social Connections: Socially Isolated (08/23/2020)   Social Connection and Isolation Panel [NHANES]    Frequency of Communication with Friends and Family: More than three times a week    Frequency of Social Gatherings with Friends and Family: More than three times a week    Attends Religious Services: Never    Marine scientist or Organizations: No    Attends Archivist Meetings: Never    Marital Status: Widowed    Tobacco Counseling Counseling given: Not Answered   Clinical Intake:  Pre-visit preparation completed: Yes        Diabetes: Yes (Followed by Endocrinology)  How often do you need to have someone help you when you read instructions, pamphlets, or other written materials from your doctor or pharmacy?: 1 - Never  Interpreter Needed?: No      Activities of Daily Living    11/27/2021   10:34 AM 12/15/2020    4:00 PM  In your present state of health, do you have any difficulty performing the following activities:  Hearing? 0 0  Vision? 0 0  Difficulty concentrating or making decisions?  0  Walking or climbing stairs? 1 1  Comment Walker in use when  ambulating   Dressing or bathing?  0  Doing errands, shopping? 1 1  Preparing Food and eating ? N   Using the Toilet? N   In the past six months, have you accidently leaked urine? N   Do you have problems with loss of bowel control? N   Managing your Medications? N   Managing your Finances? N   Housekeeping or managing your Housekeeping? Y   Comment Family assist     Patient Care Team: McLean-Scocuzza, Nino Glow, MD as PCP - General (Internal Medicine) O'Neal, Cassie Freer, MD as Consulting Physician (Internal Medicine) Murlean Iba, MD (Nephrology) Benjaman Kindler, MD as Consulting Physician (Obstetrics and Gynecology) Benjaman Kindler, MD as Consulting Physician (Obstetrics and Gynecology) [name], DPM as Consulting Physician (Podiatry) [name], DPM as Consulting Physician (Podiatry) Leona Singleton, RN as Case Manager Gollan, Kathlene November, MD as Consulting Physician (Cardiology) [name], DPM as Consulting Physician (Podiatry)  Indicate any recent Medical Services you may have received from other than [facility] providers in the past year (date may be approximate).     Assessment:   This is a routine wellness examination for Jimma.  Virtual Visit via Telephone Note  I connected with  [name] on 11/27/21 at 10:30 AM EDT by telephone and verified that I am speaking with the correct person using two identifiers.  Persons participating in the virtual visit: patient/Nurse Health Advisor   I discussed the limitations of performing an evaluation and management service by telehealth. We continued and completed visit with audio only. Some vital signs may be absent or patient reported.   Hearing/Vision screen Hearing Screening - Comments:: Patient has difficulty hearing conversational tones. Wax build up R ear. She does wear hearing aids. Notes audiology completed about a year ago. Vision Screening - Comments:: Followed  by [facility]  Wears corrective lenses  Visits every 6 months No retinopathy reported  Cataract extraction, bilateral  They have regular follow up with the ophthalmologist  Dietary issues and exercise activities discussed: Current Exercise Habits: Home exercise routine, Type of exercise: stretching;walking, Intensity: Mild Diabetic diet   Goals Addressed             This Visit's Progress    Increase physical activity       Post physical therapy exercises: sitting exercises, walking, stretching       Depression Screen    11/27/2021   10:46 AM 12/29/2020   11:01 AM 11/24/2020   10:46 AM 09/28/2020    9:39 AM 08/23/2020    4:18 PM 05/05/2020    3:42 PM 05/04/2020   10:04 AM  PHQ 2/9 Scores  PHQ - 2 Score 0 4 0 _0 PHQ- 9 Score  _1 Fall Risk    11/27/2021   10:42 AM 10/24/2021    1:39 PM 04/19/2021    1:22 PM 12/29/2020   10:27 AM 11/24/2020   10:46 AM  [place] in the past year? 0 0 0 1 0  Number falls in past yr: 0 0 0 0 0  Injury with Fall?  0 0 0   Risk for fall due to :  No Fall Risks Impaired mobility;Impaired balance/gait History of fall(s);Impaired balance/gait;Impaired mobility   Follow up _2     FALL RISK PREVENTION PERTAINING TO THE HOME: Home free of loose throw rugs in walkways, pet beds, electrical cords, etc? Yes  Adequate lighting in your home to reduce risk of falls? Yes   ASSISTIVE DEVICES UTILIZED TO PREVENT FALLS: Life alert? No  Use of a cane, walker or w/c? Yes   TIMED UP AND GO: Was the test performed? No .   Cognitive Function:  Patient is alert and oriented x3.  Notes difficulty remembering lately. Manages her own medications and finances but cannot always recall the events of the day. 6CIT completed. Next scheduled OV with PCP 6/23. F/u with memory change added to appointment notes.        11/27/2021   10:43 AM 11/24/2020   10:49 AM 04/17/2017    9:40 AM  6CIT Screen  What Year? 0 points 0 points 0 points  What month? 0 points 0 points 0 points  What time? 0 points 0 points 0 points  Count back from 20 0 points 0 points 0 points  Months in reverse 0 points 0 points 0 points  Repeat phrase 6 points 0 points 0 points  Total Score 6 points 0 points 0 points    Immunizations Immunization History  Administered Date(s) Administered   Fluad Quad(high Dose 65+) 03/12/2019, 03/23/2020, 04/19/2021   Influenza Split 03/23/2020   Influenza, High Dose Seasonal PF 03/22/2017, 03/25/2018   Influenza-Unspecified 03/19/2015, 06/24/2016   PFIZER Comirnaty(Gray Top)Covid-19 Tri-Sucrose Vaccine 11/26/2020   PFIZER(Purple Top)SARS-COV-2 Vaccination 08/09/2019, 09/02/2019, 06/28/2020   Zoster Recombinat (Shingrix) 07/06/2020, 09/10/2020   TDAP status: Due, Education has been provided regarding the importance of this vaccine. Advised may receive this vaccine at local pharmacy or Health Dept. Aware to provide a copy of the vaccination record if obtained from local pharmacy or Health Dept. Verbalized acceptance and understanding.  Screening Tests Health Maintenance  Topic Date Due  COVID-19 Vaccine (5 - Booster for Pfizer series) 04/18/2022 (Originally 01/21/2021)   Pneumonia Vaccine 74+ Years old (1 - PCV) 04/19/2022 (Originally 01/14/2004)   TETANUS/TDAP  11/28/2022 (Originally 01/13/1960)   INFLUENZA VACCINE  01/16/2022   HEMOGLOBIN A1C  02/10/2022   OPHTHALMOLOGY EXAM  04/17/2022   FOOT EXAM  11/25/2022   Zoster Vaccines- Shingrix  Completed   HPV VACCINES  Aged Out   DEXA SCAN  Discontinued   Health Maintenance There are no preventive care reminders to display for this patient. Bone density- discontinued per patient.   Lung Cancer Screening: (Low Dose CT Chest recommended if Age 108-80 years, 30 pack-year currently smoking OR have quit w/in 15years.) does not qualify.   Hepatitis C  Screening: does not qualify.  Vision Screening: Recommended annual ophthalmology exams for early detection of glaucoma and other disorders of the eye.  Dental Screening: Recommended annual dental exams for proper oral hygiene  Community Resource Referral / Chronic Care Management: CRR required this visit?  No   CCM required this visit?  No      Plan:   Keep all routine maintenance appointments.   I have personally reviewed and noted the following in the patient's chart:   Medical and social history Use of alcohol, tobacco or illicit drugs  Current medications and supplements including opioid prescriptions.  Functional ability and status Nutritional status Physical activity Advanced directives List of other physicians Hospitalizations, surgeries, and ER visits in previous 12 months Vitals Screenings to include cognitive, depression, and falls Referrals and appointments  In addition, I have reviewed and discussed with patient certain preventive protocols, quality metrics, and best practice recommendations. A written personalized care plan for preventive services as well as general preventive health recommendations were provided to patient.     Varney Biles, LPN   8/32/9191

## 2021-11-27 NOTE — Patient Instructions (Addendum)
  Alexis Farmer , Thank you for taking time to come for your Medicare Wellness Visit. I appreciate your ongoing commitment to your health goals. Please review the following plan we discussed and let me know if I can assist you in the future.   These are the goals we discussed:  Goals      Increase physical activity     Post physical therapy exercises: sitting exercises, walking, stretching        This is a list of the screening recommended for you and due dates:  Health Maintenance  Topic Date Due   COVID-19 Vaccine (5 - Booster for Pfizer series) 04/18/2022*   Pneumonia Vaccine (1 - PCV) 04/19/2022*   Tetanus Vaccine  11/28/2022*   Flu Shot  01/16/2022   Hemoglobin A1C  02/10/2022   Eye exam for diabetics  04/17/2022   Complete foot exam   11/25/2022   Zoster (Shingles) Vaccine  Completed   HPV Vaccine  Aged Out   DEXA scan (bone density measurement)  Discontinued  *Topic was postponed. The date shown is not the original due date.

## 2021-11-30 ENCOUNTER — Telehealth: Payer: Self-pay | Admitting: Internal Medicine

## 2021-11-30 ENCOUNTER — Telehealth: Payer: Self-pay | Admitting: Podiatry

## 2021-11-30 NOTE — Telephone Encounter (Signed)
Pt called stating that something else has grew on her back and she would like the provider to call her

## 2021-11-30 NOTE — Telephone Encounter (Signed)
Pt called and would like a call back about diabetic shoes

## 2021-12-01 ENCOUNTER — Telehealth: Payer: Self-pay | Admitting: Internal Medicine

## 2021-12-01 NOTE — Telephone Encounter (Signed)
Call dermatology Dr. Lanier Prude and see if they can work pt in before end of the month? Referral previously placed  Pt may have to call daily for cancellations   [address], [place], [place] [address] Hours:  Open ? Closes 3?PM Products and Services: [email or web address] Phone: [phone]

## 2021-12-05 NOTE — Telephone Encounter (Signed)
Spoke with pt and informed her that the paperwork was sent on 12/01/2021 and Endo states that they sent it this morning. Pt stated she felt like she was getting the runaround because of the time it took to get the paperwork signed. I apologized about the expierence and explained that the forms had to be signed in order to process the order due to medicare guidelines. Will call pt when paperwork comes in.

## 2021-12-05 NOTE — Telephone Encounter (Signed)
Pt is calling again and really wants an answer in regards to her diabetic shoes. She is frustrated and feels she is getting the runaround. She was told that we have faxed documentation 7 times to her PCP but no one is receiving this information.   Please advise.

## 2021-12-08 ENCOUNTER — Ambulatory Visit (INDEPENDENT_AMBULATORY_CARE_PROVIDER_SITE_OTHER): Payer: Medicare Other | Admitting: *Deleted

## 2021-12-08 ENCOUNTER — Ambulatory Visit: Payer: Medicare Other | Admitting: Cardiovascular Disease

## 2021-12-08 DIAGNOSIS — I10 Essential (primary) hypertension: Secondary | ICD-10-CM

## 2021-12-08 DIAGNOSIS — E1129 Type 2 diabetes mellitus with other diabetic kidney complication: Secondary | ICD-10-CM

## 2021-12-13 ENCOUNTER — Ambulatory Visit (INDEPENDENT_AMBULATORY_CARE_PROVIDER_SITE_OTHER): Payer: Medicare Other | Admitting: Internal Medicine

## 2021-12-13 ENCOUNTER — Telehealth: Payer: Self-pay | Admitting: *Deleted

## 2021-12-13 ENCOUNTER — Encounter: Payer: Self-pay | Admitting: Internal Medicine

## 2021-12-13 ENCOUNTER — Other Ambulatory Visit: Payer: Self-pay

## 2021-12-13 VITALS — BP 130/60 | HR 77 | Temp 98.4°F | Resp 14 | Ht 63.0 in | Wt 238.0 lb

## 2021-12-13 DIAGNOSIS — E785 Hyperlipidemia, unspecified: Secondary | ICD-10-CM

## 2021-12-13 DIAGNOSIS — E1159 Type 2 diabetes mellitus with other circulatory complications: Secondary | ICD-10-CM

## 2021-12-13 DIAGNOSIS — I152 Hypertension secondary to endocrine disorders: Secondary | ICD-10-CM

## 2021-12-13 DIAGNOSIS — I10 Essential (primary) hypertension: Secondary | ICD-10-CM | POA: Diagnosis not present

## 2021-12-13 DIAGNOSIS — R269 Unspecified abnormalities of gait and mobility: Secondary | ICD-10-CM

## 2021-12-13 DIAGNOSIS — E611 Iron deficiency: Secondary | ICD-10-CM | POA: Diagnosis not present

## 2021-12-13 DIAGNOSIS — R634 Abnormal weight loss: Secondary | ICD-10-CM | POA: Diagnosis not present

## 2021-12-13 DIAGNOSIS — E559 Vitamin D deficiency, unspecified: Secondary | ICD-10-CM

## 2021-12-13 DIAGNOSIS — D72820 Lymphocytosis (symptomatic): Secondary | ICD-10-CM

## 2021-12-13 LAB — CBC WITH DIFFERENTIAL/PLATELET
Basophils Absolute: 0.1 10*3/uL (ref 0.0–0.1)
Basophils Relative: 2 % (ref 0.0–3.0)
Eosinophils Absolute: 0.2 10*3/uL (ref 0.0–0.7)
Eosinophils Relative: 3.1 % (ref 0.0–5.0)
HCT: 38.5 % (ref 36.0–46.0)
Hemoglobin: 12.6 g/dL (ref 12.0–15.0)
Lymphocytes Relative: 55.7 % — ABNORMAL HIGH (ref 12.0–46.0)
Lymphs Abs: 3.7 10*3/uL (ref 0.7–4.0)
MCHC: 32.7 g/dL (ref 30.0–36.0)
MCV: 89.2 fl (ref 78.0–100.0)
Monocytes Absolute: 0.5 10*3/uL (ref 0.1–1.0)
Monocytes Relative: 8.2 % (ref 3.0–12.0)
Neutro Abs: 2.1 10*3/uL (ref 1.4–7.7)
Neutrophils Relative %: 31 % — ABNORMAL LOW (ref 43.0–77.0)
Platelets: 203 10*3/uL (ref 150.0–400.0)
RBC: 4.32 Mil/uL (ref 3.87–5.11)
RDW: 14.2 % (ref 11.5–15.5)
WBC: 6.6 10*3/uL (ref 4.0–10.5)

## 2021-12-13 LAB — LDL CHOLESTEROL, DIRECT: Direct LDL: 46 mg/dL

## 2021-12-13 LAB — TSH: TSH: 2.21 u[IU]/mL (ref 0.35–5.50)

## 2021-12-13 LAB — IBC + FERRITIN
Ferritin: 246.9 ng/mL (ref 10.0–291.0)
Iron: 78 ug/dL (ref 42–145)
Saturation Ratios: 22.8 % (ref 20.0–50.0)
TIBC: 341.6 ug/dL (ref 250.0–450.0)
Transferrin: 244 mg/dL (ref 212.0–360.0)

## 2021-12-13 LAB — LIPID PANEL
Cholesterol: 129 mg/dL (ref 0–200)
HDL: 45.4 mg/dL (ref >39.00)
NonHDL: 83.52
Total CHOL/HDL Ratio: 3
Triglycerides: 215 mg/dL — ABNORMAL HIGH (ref 0.0–149.0)
VLDL: 43 mg/dL — ABNORMAL HIGH (ref 0.0–40.0)

## 2021-12-13 LAB — HEMOGLOBIN A1C: Hgb A1c MFr Bld: 9.6 % — ABNORMAL HIGH (ref 4.6–6.5)

## 2021-12-13 LAB — VITAMIN D 25 HYDROXY (VIT D DEFICIENCY, FRACTURES): VITD: 55.68 ng/mL (ref 30.00–100.00)

## 2021-12-13 LAB — T4, FREE: Free T4: 0.93 ng/dL (ref 0.60–1.60)

## 2021-12-13 MED ORDER — SPIRONOLACTONE 25 MG PO TABS: 25.0000 mg | ORAL_TABLET | Freq: Every day | ORAL | 3 refills | Status: DC

## 2021-12-13 NOTE — Telephone Encounter (Signed)
   Telephone encounter was:  Unsuccessful.  12/13/2021 Name: Alexis Farmer MRN: [id number] DOB: August 23, 1940  Unsuccessful outbound call made today to assist with:  Transportation Needs   Outreach Attempt:  1st Attempt  Left message about emergent transportation for appt 12/18/2021  A HIPAA compliant voice message was left requesting a return call.  Instructed patient to call back at   Instructed patient to call back at [phone]  at their earliest convenience. .  Solomon, Care Management  [phone] [address] , [place] [address] Email : [email or web address]

## 2021-12-13 NOTE — Progress Notes (Signed)
Chief Complaint  Patient presents with   Follow-up    3 mon, disc about memory, saw Dr.Singh @[facility] and was instructed to stop all vitamins due to elevated calcium. Sees dermatology tomorrow.   F/u  1. C/o memory worse  2. Skin lesions trunk she sees dermatology [facility] tomorrow  3. CKD f/u Dr. Singh 10.7 calcium level in 11/2021 will further work up pt stopped all vitamins  4. Abnormal gait requests hoyer lift  5. Wt loss ct chest ab/pelvis in 2022 and 2023 no etiology disc we can do barium swallow or she can do egd with GI in the future    Review of Systems  Constitutional:  Positive for weight loss.  HENT:  Negative for hearing loss.   Eyes:  Negative for blurred vision.  Respiratory:  Negative for shortness of breath.   Cardiovascular:  Negative for chest pain.  Gastrointestinal:  Negative for abdominal pain and blood in stool.  Genitourinary:  Negative for dysuria.  Musculoskeletal:  Negative for falls and joint pain.  Skin:  Negative for rash.  Neurological:  Negative for headaches.  Psychiatric/Behavioral:  Positive for memory loss. Negative for depression.    Past Medical History:  Diagnosis Date   Arthritis    Chickenpox    Diabetes mellitus without complication (HCC)    one elevated reading/ no treatment   Diverticulitis    GI bleed    High cholesterol    History of blood transfusion    Hypertension    Renal insufficiency    Past Surgical History:  Procedure Laterality Date   APPENDECTOMY     CHOLECYSTECTOMY     ECTOPIC PREGNANCY SURGERY     EYE SURGERY     bilateral cataracts   EYE SURGERY     02/11/2019 repair hole in right eye    gallbladder      HYSTEROSCOPY WITH D & C N/A 10/26/2016   Procedure: DILATATION AND CURETTAGE /HYSTEROSCOPY;  Surgeon: Beasley, Bethany, MD;  Location: [facility];  Service: Gynecology;  Laterality: N/A;   HYSTEROSCOPY WITH D & C N/A 07/07/2018   Procedure: DILATATION AND CURETTAGE /HYSTEROSCOPY;  Surgeon: Beasley,  Bethany, MD;  Location: [facility] OR[facility];  Service: Gynecology;  Laterality: N/A;   THYROIDECTOMY, PARTIAL     Family History  Problem Relation Age of Onset   Diabetes Mother    Hypertension Mother    Stroke Mother    Diabetes Other    Healthy Father    Diabetes Sister    Heart disease Sister    Social History   Socioeconomic History   Marital status: Widowed    Spouse name: Not on file   Number of children: 1   Years of education: 12   Highest education level: 12th grade  Occupational History   Occupation: retired    Comment: hx of seamtress, housekeeper, 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  Tobacco Use   Smoking status: Never   Smokeless tobacco: Never  Vaping Use   Vaping Use: Never used  Substance and Sexual Activity   Alcohol use: No   Drug use: No   Sexual activity: Not Currently  Other Topics Concern   Not on file  Social History Narrative   Lives alone    From [place]   Widowed - was married 3 times starting at age 13    hx of seamtress, 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   Social Determinants of Health     Financial Resource Strain: Low Risk  (09/28/2020)   Overall Financial Resource Strain (CARDIA)    Difficulty of Paying Living Expenses: Not hard at all  Food Insecurity: No Food Insecurity (09/28/2020)   Hunger Vital Sign    Worried About Running Out of Food in the Last Year: Never true    Ran Out of Food in the Last Year: Never true  Recent Concern: Food Insecurity - Food Insecurity Present (08/01/2020)   Hunger Vital Sign    Worried About Running Out of Food in the Last Year: Sometimes true    Ran Out of Food in the Last Year: Never true  Transportation Needs: No Transportation Needs (09/28/2020)   PRAPARE - Transportation    Lack of Transportation (Medical): No    Lack of Transportation (Non-Medical): No  Physical Activity: Inactive (05/05/2020)   Exercise Vital Sign    Days of Exercise  per Week: 0 days    Minutes of Exercise per Session: 0 min  Stress: Stress Concern Present (08/23/2020)   [employer] - Occupational Stress Questionnaire    Feeling of Stress : To some extent  Social Connections: Socially Isolated (08/23/2020)   Social Connection and Isolation Panel [NHANES]    Frequency of Communication with Friends and Family: More than three times a week    Frequency of Social Gatherings with Friends and Family: More than three times a week    Attends Religious Services: Never    Active Member of Clubs or Organizations: No    Attends Club or Organization Meetings: Never    Marital Status: Widowed  Intimate Partner Violence: Unknown (09/28/2020)   Humiliation, Afraid, Rape, and Kick questionnaire    Fear of Current or Ex-Partner: No    Emotionally Abused: No    Physically Abused: Not on file    Sexually Abused: No   Current Meds  Medication Sig   ACCU-CHEK GUIDE test strip USE AS INSTRUCTED 3 TIMES DAILY   acetaminophen (TYLENOL) 500 MG tablet Take 500 mg by mouth every 6 (six) hours as needed for mild pain or headache.   aspirin EC 81 MG tablet Take 81 mg by mouth 2 (two) times a week. Swallow whole.   BD INSULIN SYRINGE U/F 30G X 1/2" 0.5 ML MISC USE UP TO 5X PER DAY WITH INSULIN 2X (NPH) AND 3X (REGULAR)   blood glucose meter kit and supplies KIT Accu chek, Dx code E11.65, check 3 times daily   Blood Pressure KIT 1 Device by Does not apply route daily.   cloNIDine (CATAPRES) 0.2 MG tablet Take 0.5 tablets (0.1 mg total) by mouth 2 (two) times daily.   hydrALAZINE (APRESOLINE) 100 MG tablet Take 1 tablet (100 mg total) by mouth in the morning, at noon, in the evening, and at bedtime.   insulin NPH Human (NOVOLIN N) 100 UNIT/ML injection INJECT 20 UNITS IN THE MORNING AND THE EVENING WITH A MEAL (Patient taking differently: Inject 20 Units into the skin 2 (two) times daily before a meal.)   insulin regular (NOVOLIN R) 100 units/mL injection  Inject 0-0.08 mLs (0-8 Units total) into the skin 3 (three) times daily before meals. (Patient taking differently: Inject 5-15 Units into the skin See admin instructions. Inject between 5 to 15 units into the skin two to three times a day with meals, PER SLIDING SCALE)   LORazepam (ATIVAN) 0.5 MG tablet Take 1 tablet (0.5 mg total) by mouth daily as needed for anxiety. D/c 0.25   nebivolol (BYSTOLIC)   2.5 MG tablet Take 1 tablet (2.5 mg total) by mouth daily.   nystatin (MYCOSTATIN/NYSTOP) powder Apply 1 application. topically 2 (two) times daily. Under abdomen   polyethylene glycol powder (GLYCOLAX/MIRALAX) 17 GM/SCOOP powder Take 17 g by mouth daily as needed for moderate constipation or severe constipation. Can take up to 2x per day prn. Mix with 8 ounces of liquid   PYRIDOXINE HCL PO Take 1 tablet by mouth daily.   rosuvastatin (CRESTOR) 20 MG tablet TAKE 1 TABLET BY MOUTH EVERY DAY   spironolactone (ALDACTONE) 25 MG tablet Take 1 tablet (25 mg total) by mouth daily.   torsemide (DEMADEX) 10 MG tablet Take 1 tablet (10 mg total) by mouth daily.   Allergies  Allergen Reactions   Celexa [Citalopram]     Diarrhea upset stomach    Jardiance [Empagliflozin] Other (See Comments)    Reaction not recalled   Norvasc [Amlodipine]     Leg edema   Tape Other (See Comments)    Leaves the skin "raw" if left on for a period of time- tolerates paper tape   Penicillin V Rash   Penicillins Rash    Has patient had a PCN reaction causing immediate rash, facial/tongue/throat swelling, SOB or lightheadedness with hypotension: Yes Has patient had a PCN reaction causing severe rash involving mucus membranes or skin necrosis: No Has patient had a PCN reaction that required hospitalization No Has patient had a PCN reaction occurring within the last 10 years: Yes If all of the above answers are "NO", then may proceed with Cephalosporin use.    No results found for this or any previous visit (from the past 2160  hour(s)). Objective  Body mass index is 42.16 kg/m. Wt Readings from Last 3 Encounters:  12/13/21 238 lb (108 kg)  11/27/21 241 lb (109.3 kg)  10/24/21 241 lb 3.2 oz (109.4 kg)   Temp Readings from Last 3 Encounters:  12/13/21 98.4 F (36.9 C) (Oral)  10/24/21 98.8 F (37.1 C) (Oral)  10/10/21 98.6 F (37 C)   BP Readings from Last 3 Encounters:  12/13/21 130/60  10/24/21 140/60  10/10/21 (!) 201/65   Pulse Readings from Last 3 Encounters:  12/13/21 77  10/24/21 69  10/10/21 76    Physical Exam Vitals and nursing note reviewed.  Constitutional:      Appearance: Normal appearance. She is well-developed and well-groomed.  HENT:     Head: Normocephalic and atraumatic.  Eyes:     Conjunctiva/sclera: Conjunctivae normal.     Pupils: Pupils are equal, round, and reactive to light.  Cardiovascular:     Rate and Rhythm: Normal rate and regular rhythm.     Heart sounds: Normal heart sounds. No murmur heard. Pulmonary:     Effort: Pulmonary effort is normal.     Breath sounds: Normal breath sounds.  Abdominal:     General: Abdomen is flat. Bowel sounds are normal.     Tenderness: There is no abdominal tenderness.  Musculoskeletal:        General: No tenderness.  Skin:    General: Skin is warm and dry.  Neurological:     General: No focal deficit present.     Mental Status: She is alert and oriented to person, place, and time. Mental status is at baseline.     Cranial Nerves: Cranial nerves 2-12 are intact.     Motor: Motor function is intact.     Coordination: Coordination is intact.     Gait: Gait is intact.       Comments: Has rollator today  Psychiatric:        Attention and Perception: Attention and perception normal.        Mood and Affect: Mood and affect normal.        Speech: Speech normal.        Behavior: Behavior normal. Behavior is cooperative.        Thought Content: Thought content normal.        Cognition and Memory: Cognition and memory normal.         Judgment: Judgment normal.     Assessment  Plan  Weight loss - Plan: TSH, T4, free, CBC with Differential/Platelet Iron deficiency - Plan: IBC + Ferritin  Hypertension associated with diabetes ([place]) - Plan: Hemoglobin A1c, Lipid panel Hydralazine 100 mg qid, clonidine 0.1 bid, bystolic 2.5 mg qd spironolactone 25 and demadex 10 mg qd    Hypercalcemia - Plan: PTH-Related Peptide, Multiple Myeloma Panel (SPEP&IFE w/QIG) Vitamin D deficiency - Plan: Vitamin D (25 hydroxy)   Hyperlipidemia, unspecified hyperlipidemia type On crestor 20 mg qhs   Abnormal gait already using rollator Rx hoyer lift   HM Flu shot utd  4/4 covid vxs had  Check on other vaccines (I.e Tdap, prevnar, pna 23, shingrix)  -as of 08/25/20 declines prevnar    mammo had 08/02/20 [facility] neg ordered again   Pap had 05/28/18 negative [facility] 07/07/18 endometrial bx neg and same 10/26/16  Of and off bleeding f/u ob/gyn and given progestin 08/11/19 did not pick up  appt 04/2020 gyn to f/u   DEXA pt declines for now  -no extra Ca h/o hyperCa    Colonoscopy  Dr. Allen Norris in [place]  -09/10/14 diverticulosis sessile inflammatory polyp=inflammatory and EGD chronic active gastritis   -f/u prn does not want to do further unless needed    CT chest 12/15/20  IMPRESSION: 1. Negative for aortic dissection or aneurysm.   2. 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 Coronary artery and Aortic atherosclerosis (ICD10-I70.0).   3. No superimposed acute or inflammatory process identified in the chest, abdomen, or pelvis. Diverticulosis of the large bowel and duodenum without active inflammation. 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   CXR 01/20/18 pulm vascular congestion otherwise neg  cxr 06/22/19 with scarring    02/03/18 thyroid bx negative   Of  note CT renal 07/16/16 c/w bulging disc and L4/5 spinal stenosis    [facility] saw 03/29/19 closed fx lateral malleolus right fibular broken ankle Carlynn Sp[facility] [place] [facility]    Specialists Renal Dr. Wallace Keller Cards Dr. Randell Patient endocrine     Provider: Dr. Olivia Mackie McLean-Scocuzza-Internal Medicine

## 2021-12-13 NOTE — Patient Instructions (Signed)
[place] 4.3 18 [employer] reviews Medical supply store in [place], [place] Service options: In-store shopping  Delivery Address: [address] [place], [place] [address] Hours:  Open ? Closes 4:30?PM Phone: [phone]  [facility] medical in [place] 3.9 [address] Google reviews Medical supply store in [place], [place] Service options: In-store shopping  Delivery Address: [address], [place], [place] [address] Hours:  Open ? Closes 6?PM Phone: [phone]

## 2021-12-13 NOTE — Telephone Encounter (Signed)
   Telephone encounter was:  Unsuccessful.  12/13/2021 Name: Alexis Farmer MRN: [id number] DOB: 08/04/1940  Unsuccessful outbound call made today to assist with:  Transportation Needs   Outreach Attempt:  2nd Attempt Called to tell daughter transportation scheduled for mom 12/18/2021 appt with [facility]  A HIPAA compliant voice message was left requesting a return call.  Instructed patient to call back at   Instructed patient to call back at [phone]  at their earliest convenience. .  [place], Care Management  [phone] [address] , [place] [address] Email : [email or web address]

## 2021-12-15 DIAGNOSIS — I509 Heart failure, unspecified: Secondary | ICD-10-CM

## 2021-12-15 DIAGNOSIS — E1159 Type 2 diabetes mellitus with other circulatory complications: Secondary | ICD-10-CM | POA: Diagnosis not present

## 2021-12-15 NOTE — Progress Notes (Unsigned)
Cardiology Office Note  Date:  12/18/2021   ID:  Alexis Farmer, DOB 06/04/1941, MRN [id number]  PCP:  Farmer, Alexis N, MD   Chief Complaint  Patient presents with   12 month follow up     Patient c/o shortness of breath with little to no exertion and left foot swelling. Medications reviewed by the patient's bottles.     HPI:  Alexis Farmer is a 80 y.o. woman with history of  obesity,  insulin-dependent diabetes, Hemoglobin A1c >9 poor diet, hypertension,  hyperlipidemia,  moderate LVH  hospital 08/13/2014  chest pain, malignant hypertension, possible sleep apnea,  chronic kidney disease,  Anxiety Notes indicate previous history of GI bleeding  colonoscopy which was normal by her report She presents to for follow-up of her hypertension, chronic diastolic CHF, poorly controlled diabetes, CKD  LOV 12/22 BP running high at home Weight down 12 pounds since last clinic visit  Reports blood pressure running high Only checks it on her left arm and his right arm typically lower secondary to stenosis of subclavian artery Pressures at home 150 up to 160 systolic  Hospitalization reviewed February 2023, ileus Nausea vomiting diarrhea abdominal pain/gastroenteritis  Discussed lab work,  A1c above 9 last week Lipids well controlled  Denies significant leg swelling  EKG personally reviewed by myself on todays visit Normal sinus rhythm rate 68 bpm LVH  Other past medical history reviewed Followed by vascular in [place] Carotid u/s  Normal flow hemodynamics were seen in the left subclavian  artery. The right subclavian artery has dampened monophasic flow,  retrograde vertebral artery flow, as well as difference in  brachial  artery pressuress suggestive of right subclavian steal   hospital end of June 2022 Symptoms of anxiety.  Patient reports that worsening tinnitus, lightheadedness and flushed.  She also report urinary frequency, urgency lower extremity edema, mild  nausea.  Troponin of 25 felt secondary to elevated blood pressure  echocardiogram reviewed from June 2022   1. Left ventricular ejection fraction, by estimation, is 60 to 65%. The  left ventricle has normal function. The left ventricle has no regional  wall motion abnormalities. There is moderate concentric left ventricular  hypertrophy. Indeterminate diastolic  filling due to E-A fusion.   2. Right ventricular systolic function is normal. The right ventricular  size is normal. Tricuspid regurgitation signal is inadequate for assessing  PA pressure.   CT chest  Calcified coronary artery and aortic atherosclerosis. 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 Negative for thoracic aortic aneurysm or dissection. Calcified atherosclerosis involving the other proximal great vessels which appear patent  10/2016  D&C, hysteroscopy.  Postmenopausal bleeding.   Previous Echocardiogram 08/13/2014 showed normal ejection fraction, normal RV size and function, moderate LVH   Renal artery duplex showing no blockage of the mid to distal vessels bilaterally, challenging study    PMH:   has a past medical history of Arthritis, Chickenpox, Diabetes mellitus without complication (HCC), Diverticulitis, GI bleed, High cholesterol, History of blood transfusion, Hypertension, and Renal insufficiency.  PSH:    Past Surgical History:  Procedure Laterality Date   APPENDECTOMY     CHOLECYSTECTOMY     ECTOPIC PREGNANCY SURGERY     EYE SURGERY     bilateral cataracts   EYE SURGERY     02/11/2019 repair hole in right eye    gallbladder      HYSTEROSCOPY WITH D & C N/A 10/26/2016     Procedure: DILATATION AND CURETTAGE /HYSTEROSCOPY;  Surgeon: Benjaman Kindler, MD;  Location: [facility] ORS;  Service: Gynecology;  Laterality: N/A;   HYSTEROSCOPY WITH D & C N/A 07/07/2018   Procedure: DILATATION AND CURETTAGE  /HYSTEROSCOPY;  Surgeon: Benjaman Kindler, MD;  Location: [facility] ORS;  Service: Gynecology;  Laterality: N/A;   THYROIDECTOMY, PARTIAL      Current Outpatient Medications  Medication Sig Dispense Refill   acetaminophen (TYLENOL) 500 MG tablet Take 500 mg by mouth every 6 (six) hours as needed for mild pain or headache.     aspirin EC 81 MG tablet Take 81 mg by mouth 2 (two) times a week. Swallow whole.     cloNIDine (CATAPRES) 0.2 MG tablet Take 0.5 tablets (0.1 mg total) by mouth 2 (two) times daily. 270 tablet 3   hydrALAZINE (APRESOLINE) 100 MG tablet Take 1 tablet (100 mg total) by mouth in the morning, at noon, in the evening, and at bedtime. 360 tablet 3   insulin NPH Human (NOVOLIN N) 100 UNIT/ML injection INJECT 20 UNITS IN THE MORNING AND THE EVENING WITH A MEAL (Patient taking differently: Inject 20 Units into the skin 2 (two) times daily before a meal.) 40 mL PRN   insulin regular (NOVOLIN R) 100 units/mL injection Inject 0-0.08 mLs (0-8 Units total) into the skin 3 (three) times daily before meals. (Patient taking differently: Inject 5-15 Units into the skin See admin instructions. Inject between 5 to 15 units into the skin two to three times a day with meals, PER SLIDING SCALE) 30 mL 11   LORazepam (ATIVAN) 0.5 MG tablet Take 1 tablet (0.5 mg total) by mouth daily as needed for anxiety. D/c 0.25 30 tablet 5   nebivolol (BYSTOLIC) 2.5 MG tablet Take 1 tablet (2.5 mg total) by mouth daily. 90 tablet 3   nystatin (MYCOSTATIN/NYSTOP) powder Apply 1 application. topically 2 (two) times daily. Under abdomen 60 g 11   polyethylene glycol powder (GLYCOLAX/MIRALAX) 17 GM/SCOOP powder Take 17 g by mouth daily as needed for moderate constipation or severe constipation. Can take up to 2x per day prn. Mix with 8 ounces of liquid 3350 g 11   PYRIDOXINE HCL PO Take 1 tablet by mouth daily.     rosuvastatin (CRESTOR) 20 MG tablet TAKE 1 TABLET BY MOUTH EVERY DAY 90 tablet 3   spironolactone (ALDACTONE)  25 MG tablet Take 1 tablet (25 mg total) by mouth daily. 90 tablet 3   torsemide (DEMADEX) 10 MG tablet Take 1 tablet (10 mg total) by mouth daily. 90 tablet 3   ACCU-CHEK GUIDE test strip USE AS INSTRUCTED 3 TIMES DAILY (Patient not taking: Reported on 12/18/2021) 300 strip 12   Ascorbic Acid (VITAMIN C PO) Take 1 tablet by mouth daily. (Patient not taking: Reported on 12/18/2021)     BD INSULIN SYRINGE U/F 30G X 1/2" 0.5 ML MISC USE UP TO 5X PER DAY WITH INSULIN 2X (NPH) AND 3X (REGULAR) (Patient not taking: Reported on 12/18/2021) 500 each 12   blood glucose meter kit and supplies KIT Accu chek, Dx code E11.65, check 3 times daily (Patient not taking: Reported on 12/18/2021) 1 each 0   Blood Pressure KIT 1 Device by Does not apply route daily. (Patient not taking: Reported on 12/18/2021) 1 kit 0   Multiple Vitamins-Minerals (CENTRUM SILVER 50+WOMEN) TABS Take 1 tablet by mouth daily with breakfast. (Patient not taking: Reported on 12/13/2021)     ondansetron (ZOFRAN) 4 MG tablet Take 1 tablet (4 mg total) by  mouth every 6 (six) hours as needed for nausea. (Patient not taking: Reported on 12/13/2021) 20 tablet 0   No current facility-administered medications for this visit.    Allergies:   Celexa [citalopram], Jardiance [empagliflozin], Norvasc [amlodipine], Tape, Penicillin v, and Penicillins   Social History:  The patient  reports that she has never smoked. She has never used smokeless tobacco. She reports that she does not drink alcohol and does not use drugs.   Family History:   family history includes Diabetes in her mother, sister, and another family member; Healthy in her father; Heart disease in her sister; Hypertension in her mother; Stroke in her mother.    Review of Systems: Review of Systems  Constitutional: Negative.   Eyes: Negative.   Respiratory: Negative.    Cardiovascular:  Positive for leg swelling.  Gastrointestinal: Negative.   Genitourinary: Negative.   Musculoskeletal:  Negative.   Neurological: Negative.   Psychiatric/Behavioral: Negative.    All other systems reviewed and are negative.   PHYSICAL EXAM: VS:  BP (!) 182/68 (BP Location: Left Arm, Patient Position: Sitting, Cuff Size: Large)   Pulse 68   Ht 5' 3" (1.6 m)   Wt 240 lb 8 oz (109.1 kg)   SpO2 98%   BMI 42.60 kg/m  , BMI Body mass index is 42.6 kg/m.  Constitutional:  oriented to person, place, and time. No distress.  HENT:  Head: Grossly normal Eyes:  no discharge. No scleral icterus.  Neck: No JVD, 1+ carotid bruit on the right Cardiovascular: Regular rate and rhythm, no murmurs appreciated Pulmonary/Chest: Clear to auscultation bilaterally, no wheezes or rails Abdominal: Soft.  no distension.  no tenderness.  Musculoskeletal: Normal range of motion Neurological:  normal muscle tone. Coordination normal. No atrophy Skin: Skin warm and dry Psychiatric: normal affect, pleasant  Recent Labs: 08/14/2021: ALT 25 08/15/2021: BUN 5; Creatinine, Ser 1.12; Potassium 3.8; Sodium 138 12/13/2021: Hemoglobin 12.6; Platelets 203.0; TSH 2.21    Lipid Panel Lab Results  Component Value Date   CHOL 129 12/13/2021   HDL 45.40 12/13/2021   LDLCALC 40 12/15/2020   TRIG 215.0 (H) 12/13/2021      Wt Readings from Last 3 Encounters:  12/18/21 240 lb 8 oz (109.1 kg)  12/13/21 238 lb (108 kg)  11/27/21 241 lb (109.3 kg)     ASSESSMENT AND PLAN:  Essential hypertension, benign Blood pressure running high 150 up to 160 systolic Calcium channel blockers held for leg swelling Given diabetes, mild chronic kidney disease, recommend she start valsartan 160 daily Stay on clonidine, hydralazine Increase bystolic up to 10 mg daily Recommend she talk with nephrology next month on her visit and have BMP  Venous insufficiency Recommended compression hose, leg elevation, Ace wraps if needed Avoid calcium channel blockers Leg swelling stable  Mixed hyperlipidemia On crestor 10 Cholesterol at  goal  Uncontrolled type 2 diabetes mellitus with stage 3 chronic kidney disease, with long-term current use of insulin (HCC) Uncontrolled, A1C around 9, working with endocrine Long history of poorly controlled,  Stressed importance of continued diet, she has lost 12 pounds to her credit  CKD (chronic kidney disease) stage 3, GFR 30-59 ml/min Plan: Sees Dr. Singh On torsemide 10 daily, starting ARB  Morbid obesity (HCC) Unable to exercise. 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  I,  , M.D. Ph.D., have reviewed the above documentation for accuracy and completeness, and I agree with the above.    Signed, Tim , M.D., Ph.D. 12/18/2021  [facility], [place] [phone]   

## 2021-12-18 ENCOUNTER — Ambulatory Visit (INDEPENDENT_AMBULATORY_CARE_PROVIDER_SITE_OTHER): Payer: Medicare Other | Admitting: Cardiovascular Disease

## 2021-12-18 ENCOUNTER — Encounter: Payer: Self-pay | Admitting: Cardiovascular Disease

## 2021-12-18 VITALS — BP 182/68 | HR 68 | Ht 63.0 in | Wt 240.5 lb

## 2021-12-18 DIAGNOSIS — I739 Peripheral vascular disease, unspecified: Secondary | ICD-10-CM | POA: Diagnosis not present

## 2021-12-18 DIAGNOSIS — I700 Atherosclerosis of aorta: Secondary | ICD-10-CM

## 2021-12-18 DIAGNOSIS — I5032 Chronic diastolic (congestive) heart failure: Secondary | ICD-10-CM

## 2021-12-18 DIAGNOSIS — E785 Hyperlipidemia, unspecified: Secondary | ICD-10-CM

## 2021-12-18 DIAGNOSIS — I6523 Occlusion and stenosis of bilateral carotid arteries: Secondary | ICD-10-CM

## 2021-12-18 DIAGNOSIS — E1159 Type 2 diabetes mellitus with other circulatory complications: Secondary | ICD-10-CM

## 2021-12-18 DIAGNOSIS — I152 Hypertension secondary to endocrine disorders: Secondary | ICD-10-CM

## 2021-12-18 DIAGNOSIS — I10 Essential (primary) hypertension: Secondary | ICD-10-CM | POA: Diagnosis not present

## 2021-12-18 DIAGNOSIS — R002 Palpitations: Secondary | ICD-10-CM

## 2021-12-18 DIAGNOSIS — E782 Mixed hyperlipidemia: Secondary | ICD-10-CM

## 2021-12-18 DIAGNOSIS — G458 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: Secondary | ICD-10-CM

## 2021-12-18 MED ORDER — VALSARTAN 160 MG PO TABS: 160.0000 mg | ORAL_TABLET | Freq: Every day | ORAL | 1 refills | Status: DC

## 2021-12-18 MED ORDER — CLONIDINE HCL 0.2 MG PO TABS: 0.2000 mg | ORAL_TABLET | Freq: Two times a day (BID) | ORAL | 3 refills | Status: DC

## 2021-12-18 MED ORDER — NEBIVOLOL HCL 10 MG PO TABS: 10.0000 mg | ORAL_TABLET | Freq: Every day | ORAL | 1 refills | Status: DC

## 2021-12-18 MED ORDER — HYDRALAZINE HCL 100 MG PO TABS: 100.0000 mg | ORAL_TABLET | Freq: Four times a day (QID) | ORAL | 1 refills | Status: DC

## 2021-12-18 NOTE — Patient Instructions (Addendum)
Medication Instructions:   CONTINUE Clonidine 0.2 mg TWICE daily   INCREASE Bystolic up to 10 mg daily - A new Rx has been sent to your pharmacy  START Valsartan 160 mg daily - A new Rx has been sent to your pharmacy  If you need a refill on your cardiac medications before your next appointment, please call your pharmacy.   Please monitor your blood pressure at home. BP log given today in office.  Lab work: No new labs needed  Testing/Procedures: No new testing needed  Follow-Up: At [facility], you and your health needs are our priority.  As part of our continuing mission to provide you with exceptional heart care, we have created designated Provider Care Teams.  These Care Teams include your primary Cardiologist (physician) and Advanced Practice Providers (APPs -  Physician Assistants and Nurse Practitioners) who all work together to provide you with the care you need, when you need it.  You will need a follow up appointment in 6 months  Providers on your designated Care Team:   Murray Hodgkins, NP Christell Faith, PA-C Cadence Kathlen Mody, [place]  COVID-19 Vaccine Information can be found at: [email or web address] For questions related to vaccine distribution or appointments, please email [email or web address] or call [phone].

## 2021-12-19 LAB — MULTIPLE MYELOMA PANEL, SERUM
Albumin SerPl Elph-Mcnc: 3.8 g/dL (ref 2.9–4.4)
Albumin/Glob SerPl: 1.2 (ref 0.7–1.7)
Alpha 1: 0.2 g/dL (ref 0.0–0.4)
Alpha2 Glob SerPl Elph-Mcnc: 0.9 g/dL (ref 0.4–1.0)
B-Globulin SerPl Elph-Mcnc: 0.9 g/dL (ref 0.7–1.3)
Gamma Glob SerPl Elph-Mcnc: 1.3 g/dL (ref 0.4–1.8)
Globulin, Total: 3.2 g/dL (ref 2.2–3.9)
IgA/Immunoglobulin A, Serum: 197 mg/dL (ref 64–422)
IgG (Immunoglobin G), Serum: 1278 mg/dL (ref 586–1602)
IgM (Immunoglobulin M), Srm: 142 mg/dL (ref 26–217)
Total Protein: 7 g/dL (ref 6.0–8.5)

## 2021-12-20 LAB — PTH-RELATED PEPTIDE: PTH-Related Protein (PTH-RP): 16 pg/mL (ref 11–20)

## 2021-12-21 ENCOUNTER — Telehealth: Payer: Self-pay | Admitting: Internal Medicine

## 2021-12-21 NOTE — Telephone Encounter (Signed)
Pt returning call about labs... Pt requesting callback.Marland KitchenMarland KitchenMarland Kitchen

## 2021-12-21 NOTE — Telephone Encounter (Signed)
Pt called in about scheduling her appt for hematology... Spoke with [facility]... Elita Quick advised that Dr. Olivia Mackie placed the referral and the pt will receive a call from referral coordinator.... Pt called back in.... Spoke with Pt , advised pt that Dr. Olivia Mackie placed referral and for her to wait for referral coordinator to call her to schedule appt... Pt understood.Marland KitchenMarland Kitchen

## 2021-12-21 NOTE — Addendum Note (Signed)
Addended by: Orland Mustard on: 12/21/2021 01:11 PM   Modules accepted: Orders

## 2021-12-26 ENCOUNTER — Inpatient Hospital Stay: Payer: Medicare Other | Attending: Oncology | Admitting: Oncology

## 2021-12-26 ENCOUNTER — Inpatient Hospital Stay: Payer: Medicare Other

## 2021-12-26 ENCOUNTER — Encounter: Payer: Self-pay | Admitting: Oncology

## 2021-12-26 VITALS — BP 196/56 | HR 61 | Temp 97.3°F | Wt 240.0 lb

## 2021-12-26 DIAGNOSIS — Z114 Encounter for screening for human immunodeficiency virus [HIV]: Secondary | ICD-10-CM | POA: Diagnosis not present

## 2021-12-26 DIAGNOSIS — D72829 Elevated white blood cell count, unspecified: Secondary | ICD-10-CM | POA: Diagnosis not present

## 2021-12-26 DIAGNOSIS — Z139 Encounter for screening, unspecified: Secondary | ICD-10-CM

## 2021-12-26 DIAGNOSIS — N1832 Chronic kidney disease, stage 3b: Secondary | ICD-10-CM | POA: Insufficient documentation

## 2021-12-26 DIAGNOSIS — D72820 Lymphocytosis (symptomatic): Secondary | ICD-10-CM

## 2021-12-26 LAB — CBC WITH DIFFERENTIAL/PLATELET
Abs Immature Granulocytes: 0.01 10*3/uL (ref 0.00–0.07)
Basophils Absolute: 0.1 10*3/uL (ref 0.0–0.1)
Basophils Relative: 1 %
Eosinophils Absolute: 0.2 10*3/uL (ref 0.0–0.5)
Eosinophils Relative: 3 %
HCT: 37.9 % (ref 36.0–46.0)
Hemoglobin: 12.5 g/dL (ref 12.0–15.0)
Immature Granulocytes: 0 %
Lymphocytes Relative: 66 %
Lymphs Abs: 4.5 10*3/uL — ABNORMAL HIGH (ref 0.7–4.0)
MCH: 29.2 pg (ref 26.0–34.0)
MCHC: 33 g/dL (ref 30.0–36.0)
MCV: 88.6 fL (ref 80.0–100.0)
Monocytes Absolute: 0.5 10*3/uL (ref 0.1–1.0)
Monocytes Relative: 7 %
Neutro Abs: 1.6 10*3/uL — ABNORMAL LOW (ref 1.7–7.7)
Neutrophils Relative %: 23 %
Platelets: 229 10*3/uL (ref 150–400)
RBC: 4.28 MIL/uL (ref 3.87–5.11)
RDW: 13.9 % (ref 11.5–15.5)
WBC: 6.9 10*3/uL (ref 4.0–10.5)
nRBC: 0 % (ref 0.0–0.2)

## 2021-12-26 LAB — COMPREHENSIVE METABOLIC PANEL
ALT: 21 U/L (ref 0–44)
AST: 26 U/L (ref 15–41)
Albumin: 3.8 g/dL (ref 3.5–5.0)
Alkaline Phosphatase: 36 U/L — ABNORMAL LOW (ref 38–126)
Anion gap: 6 (ref 5–15)
BUN: 22 mg/dL (ref 8–23)
CO2: 27 mmol/L (ref 22–32)
Calcium: 10 mg/dL (ref 8.9–10.3)
Chloride: 101 mmol/L (ref 98–111)
Creatinine, Ser: 1.22 mg/dL — ABNORMAL HIGH (ref 0.44–1.00)
GFR, Estimated: 44 mL/min — ABNORMAL LOW (ref >60)
Glucose, Bld: 223 mg/dL — ABNORMAL HIGH (ref 70–99)
Potassium: 4.1 mmol/L (ref 3.5–5.1)
Sodium: 134 mmol/L — ABNORMAL LOW (ref 135–145)
Total Bilirubin: 0.7 mg/dL (ref 0.3–1.2)
Total Protein: 7.4 g/dL (ref 6.5–8.1)

## 2021-12-26 LAB — HEPATITIS PANEL, ACUTE
HCV Ab: NONREACTIVE
Hep A IgM: NONREACTIVE
Hep B C IgM: NONREACTIVE
Hepatitis B Surface Ag: NONREACTIVE

## 2021-12-26 LAB — TECHNOLOGIST SMEAR REVIEW
Plt Morphology: NORMAL
RBC MORPHOLOGY: NORMAL
WBC MORPHOLOGY: NORMAL

## 2021-12-26 LAB — LACTATE DEHYDROGENASE: LDH: 160 U/L (ref 98–192)

## 2021-12-26 NOTE — Progress Notes (Signed)
Hematology/Oncology Consult Note [phone] [phone]    .refer    REFERRING PROVIDER: McLean-Scocuzza, Nino Glow, MD  CHIEF COMPLAINTS/REASON FOR VISIT:  Evaluation of leukocytosis  HISTORY OF PRESENTING ILLNESS:  Alexis Farmer is a  81 y.o.  female with PMH listed below who was referred to me for evaluation of leukocytosis Reviewed patient' recent labs obtained by PCP.   12/13/2021 CBC showed elevated white count of 6.6, increased lymphocyte percentage 55.7.  Absolute lymphocytes 3.7. Previous lab records reviewed. Leukocytosis onset of intermittent. Patient reports chronic arthritis, bilateral lower extremity swelling, left worse than right.  She was recently seen by dermatology for a skin lesion on her back.  Patient reports that her previously weighed 260 pounds.  Currently she weighed 240 pounds. Appetite is fair. No fever, night sweating.  Patient was accompanied by her granddaughter.    Review of Systems  Constitutional:  Positive for fatigue and unexpected weight change. Negative for appetite change, chills and fever.  HENT:   Negative for hearing loss and voice change.   Eyes:  Negative for eye problems.  Respiratory:  Negative for chest tightness and cough.   Cardiovascular:  Negative for chest pain.  Gastrointestinal:  Negative for abdominal distention, abdominal pain and blood in stool.  Endocrine: Negative for hot flashes.  Genitourinary:  Negative for difficulty urinating and frequency.   Musculoskeletal:  Positive for arthralgias.  Skin:  Positive for rash. Negative for itching.  Neurological:  Negative for extremity weakness.  Hematological:  Negative for adenopathy.  Psychiatric/Behavioral:  Negative for confusion.      MEDICAL HISTORY:  Past Medical History:  Diagnosis Date   Arthritis    Chickenpox    Diabetes mellitus without complication ([place])    one elevated reading/ no treatment   Diverticulitis    GI bleed    High  cholesterol    History of blood transfusion    Hypertension    Renal insufficiency     SURGICAL HISTORY: Past Surgical History:  Procedure Laterality Date   APPENDECTOMY     CHOLECYSTECTOMY     ECTOPIC PREGNANCY SURGERY     EYE SURGERY     bilateral cataracts   EYE SURGERY     02/11/2019 repair hole in right eye    gallbladder      HYSTEROSCOPY WITH D & C N/A 10/26/2016   Procedure: DILATATION AND CURETTAGE /HYSTEROSCOPY;  Surgeon: Benjaman Kindler, MD;  Location: [facility] ORS;  Service: Gynecology;  Laterality: N/A;   HYSTEROSCOPY WITH D & C N/A 07/07/2018   Procedure: DILATATION AND CURETTAGE /HYSTEROSCOPY;  Surgeon: Benjaman Kindler, MD;  Location: [facility] ORS;  Service: Gynecology;  Laterality: N/A;   THYROIDECTOMY, PARTIAL      SOCIAL HISTORY: Social History   Socioeconomic History   Marital status: Widowed    Spouse name: Not on file   Number of children: 1   Years of education: 90   Highest education level: 12th grade  Occupational History   Occupation: retired    Comment: hx of Pharmacist, hospital, Secretary/administrator, 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  Tobacco Use   Smoking status: Never   Smokeless tobacco: Never  Vaping Use   Vaping Use: Never used  Substance and Sexual Activity   Alcohol use: No   Drug use: No   Sexual activity: Not Currently  Other Topics Concern   Not on file  Social History Narrative   Lives alone    From [place]   Widowed -  was married 3 times starting at age 39    hx of seamtress, 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   Social Determinants of Health   Financial Resource Strain: Low Risk  (09/28/2020)   Overall Financial Resource Strain (CARDIA)    Difficulty of Paying Living Expenses: Not hard at all  Food Insecurity: No Food Insecurity (09/28/2020)   Hunger Vital Sign    Worried About Running Out of Food in the Last Year: Never true    [place] in the Last Year: Never true   Recent Concern: Food Insecurity - Food Insecurity Present (08/01/2020)   Hunger Vital Sign    Worried About Running Out of Food in the Last Year: Sometimes true    Ran Out of Food in the Last Year: Never true  Transportation Needs: No Transportation Needs (09/28/2020)   PRAPARE - Hydrologist (Medical): No    Lack of Transportation (Non-Medical): No  Physical Activity: Inactive (05/05/2020)   Exercise Vital Sign    Days of Exercise per Week: 0 days    Minutes of Exercise per Session: 0 min  Stress: Stress Concern Present (08/23/2020)   [place]    Feeling of Stress : To some extent  Social Connections: Socially Isolated (08/23/2020)   Social Connection and Isolation Panel [NHANES]    Frequency of Communication with Friends and Family: More than three times a week    Frequency of Social Gatherings with Friends and Family: More than three times a week    Attends Religious Services: Never    Marine scientist or Organizations: No    Attends Archivist Meetings: Never    Marital Status: Widowed  Intimate Partner Violence: Unknown (09/28/2020)   Humiliation, Afraid, Rape, and Kick questionnaire    Fear of Current or Ex-Partner: No    Emotionally Abused: No    Physically Abused: Not on file    Sexually Abused: No    FAMILY HISTORY: Family History  Problem Relation Age of Onset   Diabetes Mother    Hypertension Mother    Stroke Mother    Diabetes Other    Healthy Father    Diabetes Sister    Heart disease Sister     ALLERGIES:  is allergic to celexa [citalopram], jardiance [empagliflozin], norvasc [amlodipine], tape, penicillin v, and penicillins.  MEDICATIONS:  Current Outpatient Medications  Medication Sig Dispense Refill   aspirin EC 81 MG tablet Take 81 mg by mouth 2 (two) times a week. Swallow whole.     cloNIDine (CATAPRES) 0.2 MG tablet Take 1 tablet (0.2 mg  total) by mouth 2 (two) times daily. 270 tablet 3   hydrALAZINE (APRESOLINE) 100 MG tablet Take 1 tablet (100 mg total) by mouth in the morning, at noon, in the evening, and at bedtime. 360 tablet 1   LORazepam (ATIVAN) 0.5 MG tablet Take 1 tablet (0.5 mg total) by mouth daily as needed for anxiety. D/c 0.25 30 tablet 5   rosuvastatin (CRESTOR) 20 MG tablet TAKE 1 TABLET BY MOUTH EVERY DAY 90 tablet 3   spironolactone (ALDACTONE) 25 MG tablet Take 1 tablet (25 mg total) by mouth daily. 90 tablet 3   torsemide (DEMADEX) 10 MG tablet Take 1 tablet (10 mg total) by mouth daily. 90 tablet 3   valsartan (DIOVAN) 160 MG tablet Take 1 tablet (160 mg total) by mouth daily. 90 tablet 1  ACCU-CHEK GUIDE test strip USE AS INSTRUCTED 3 TIMES DAILY (Patient not taking: Reported on 12/18/2021) 300 strip 12   acetaminophen (TYLENOL) 500 MG tablet Take 500 mg by mouth every 6 (six) hours as needed for mild pain or headache. (Patient not taking: Reported on 12/26/2021)     Ascorbic Acid (VITAMIN C PO) Take 1 tablet by mouth daily. (Patient not taking: Reported on 12/18/2021)     BD INSULIN SYRINGE U/F 30G X 1/2" 0.5 ML MISC USE UP TO 5X PER DAY WITH INSULIN 2X (NPH) AND 3X (REGULAR) (Patient not taking: Reported on 12/18/2021) 500 each 12   blood glucose meter kit and supplies KIT Accu chek, Dx code E11.65, check 3 times daily (Patient not taking: Reported on 12/18/2021) 1 each 0   Blood Pressure KIT 1 Device by Does not apply route daily. (Patient not taking: Reported on 12/18/2021) 1 kit 0   insulin NPH Human (NOVOLIN N) 100 UNIT/ML injection INJECT 20 UNITS IN THE MORNING AND THE EVENING WITH A MEAL (Patient not taking: Reported on 12/26/2021) 40 mL PRN   insulin regular (NOVOLIN R) 100 units/mL injection Inject 0-0.08 mLs (0-8 Units total) into the skin 3 (three) times daily before meals. (Patient not taking: Reported on 12/26/2021) 30 mL 11   Multiple Vitamins-Minerals (CENTRUM SILVER 50+WOMEN) TABS Take 1 tablet by mouth  daily with breakfast. (Patient not taking: Reported on 12/13/2021)     nebivolol (BYSTOLIC) 10 MG tablet Take 1 tablet (10 mg total) by mouth daily. (Patient not taking: Reported on 12/26/2021) 90 tablet 1   nystatin (MYCOSTATIN/NYSTOP) powder Apply 1 application. topically 2 (two) times daily. Under abdomen (Patient not taking: Reported on 12/26/2021) 60 g 11   ondansetron (ZOFRAN) 4 MG tablet Take 1 tablet (4 mg total) by mouth every 6 (six) hours as needed for nausea. (Patient not taking: Reported on 12/13/2021) 20 tablet 0   polyethylene glycol powder (GLYCOLAX/MIRALAX) 17 GM/SCOOP powder Take 17 g by mouth daily as needed for moderate constipation or severe constipation. Can take up to 2x per day prn. Mix with 8 ounces of liquid (Patient not taking: Reported on 12/26/2021) 3350 g 11   PYRIDOXINE HCL PO Take 1 tablet by mouth daily. (Patient not taking: Reported on 12/26/2021)     No current facility-administered medications for this visit.     PHYSICAL EXAMINATION: ECOG PERFORMANCE STATUS: 2 - Symptomatic, <50% confined to bed Vitals:   12/26/21 1118  BP: (!) 196/56  Pulse: 61  Temp: (!) 97.3 F (36.3 C)   Filed Weights   12/26/21 1118  Weight: 240 lb (108.9 kg)    Physical Exam Constitutional:      General: She is not in acute distress.    Appearance: She is obese.  HENT:     Head: Normocephalic and atraumatic.  Eyes:     General: No scleral icterus. Cardiovascular:     Rate and Rhythm: Normal rate and regular rhythm.     Heart sounds: Normal heart sounds.  Pulmonary:     Effort: Pulmonary effort is normal. No respiratory distress.     Breath sounds: No wheezing.  Abdominal:     General: Bowel sounds are normal. There is no distension.     Palpations: Abdomen is soft.  Musculoskeletal:        General: No deformity. Normal range of motion.     Cervical back: Normal range of motion and neck supple.     Right lower leg: Edema present.  Left lower leg: Edema present.   Skin:    General: Skin is warm and dry.     Findings: No erythema or rash.  Neurological:     Mental Status: She is alert and oriented to person, place, and time. Mental status is at baseline.     Cranial Nerves: No cranial nerve deficit.     Coordination: Coordination normal.  Psychiatric:        Mood and Affect: Mood normal.     Labs are reviewed by me.     Latest Ref Rng & Units 12/26/2021   12:08 PM 12/13/2021   10:33 AM 08/14/2021    9:43 AM  CBC  WBC 4.0 - 10.5 K/uL 6.9  6.6  6.8   Hemoglobin 12.0 - 15.0 g/dL 12.5  12.6  12.2   Hematocrit 36.0 - 46.0 % 37.9  38.5  38.6   Platelets 150 - 400 K/uL 229  203.0  205       Latest Ref Rng & Units 12/26/2021   12:08 PM 12/13/2021   10:33 AM 08/15/2021    9:26 AM  CMP  Glucose 70 - 99 mg/dL 223   258   BUN 8 - 23 mg/dL 22   5   Creatinine 0.44 - 1.00 mg/dL 1.22   1.12   Sodium 135 - 145 mmol/L 134   138   Potassium 3.5 - 5.1 mmol/L 4.1   3.8   Chloride 98 - 111 mmol/L 101   104   CO2 22 - 32 mmol/L 27   27   Calcium 8.9 - 10.3 mg/dL 10.0   9.2   Total Protein 6.5 - 8.1 g/dL 7.4  7.0    Total Bilirubin 0.3 - 1.2 mg/dL 0.7     Alkaline Phos 38 - 126 U/L 36     AST 15 - 41 U/L 26     ALT 0 - 44 U/L 21         RADIOGRAPHIC STUDIES: I have personally reviewed the radiological images as listed and agreed with the findings in the report. No results found.  LABORATORY DATA:  I have reviewed the data as listed Lab Results  Component Value Date   WBC 6.9 12/26/2021   HGB 12.5 12/26/2021   HCT 37.9 12/26/2021   MCV 88.6 12/26/2021   PLT 229 12/26/2021   Recent Labs    08/13/21 0025 08/14/21 0943 08/15/21 0926 12/13/21 1033 12/26/21 1208  NA 136 136 138  --  134*  K 3.6 3.9 3.8  --  4.1  CL 100 101 104  --  101  CO2 _0 --  27  GLUCOSE 268* 261* 258*  --  223*  BUN 10 12 5*  --  22  CREATININE 1.21* 1.27* 1.12*  --  1.22*  CALCIUM 8.4* 8.5* 9.2  --  10.0  GFRNONAA 45* 42* 49*  --  44*  PROT 6.0* 6.4*   --  7.0 7.4  ALBUMIN 3.0* 3.2*  --   --  3.8  AST 26 31  --   --  26  ALT 24 25  --   --  21  ALKPHOS 38 39  --   --  36*  BILITOT 0.6 0.7  --   --  0.7   Iron/TIBC/Ferritin/ %Sat    Component Value Date/Time   IRON 78 12/13/2021 1033   TIBC 341.6 12/13/2021 1033   FERRITIN 246.9 12/13/2021 1033   IRONPCTSAT 22.8 12/13/2021 1033  ASSESSMENT & PLAN:  1. Lymphocytosis   2. Screening for human immunodeficiency virus   3. Stage 3b chronic kidney disease (HCC)    #Intermittent lymphocytosis, Differential diagnosis include acute/chronic inflammation, medication side effects,Bone marrow disorders Check CBC, smear, multiple myeloma panel, light chain ratio, flow cytometry, ANA, LDH, hepatitis panel, CMP.  #Chronic kidney disease, avoid nephrotoxin.  Pending above work-up . Orders Placed This Encounter  Procedures   CBC with Differential/Platelet    Standing Status:   Future    Number of Occurrences:   1    Standing Expiration Date:   12/27/2022   Technologist smear review    Standing Status:   Future    Number of Occurrences:   1    Standing Expiration Date:   12/27/2022   Kappa/lambda light chains    Standing Status:   Future    Number of Occurrences:   1    Standing Expiration Date:   12/27/2022   Flow cytometry panel-leukemia/lymphoma work-up    Standing Status:   Future    Number of Occurrences:   1    Standing Expiration Date:   12/27/2022   ANA, IFA (with reflex)    Standing Status:   Future    Number of Occurrences:   1    Standing Expiration Date:   12/27/2022   Lactate dehydrogenase    Standing Status:   Future    Number of Occurrences:   1    Standing Expiration Date:   12/27/2022   Hepatitis panel, acute    Standing Status:   Future    Number of Occurrences:   1    Standing Expiration Date:   12/27/2022   Comprehensive metabolic panel    Standing Status:   Future    Number of Occurrences:   1    Standing Expiration Date:   12/26/2022    All questions  were answered. The patient knows to call the clinic with any problems questions or concerns.  Return of visit: 3 weeks to discuss labs. Thank you for this kind referral and the opportunity to participate in the care of this patient. A copy of today's note is routed to referring provider   Earlie Server, MD, PhD Hematology Oncology 12/26/2021

## 2021-12-27 ENCOUNTER — Other Ambulatory Visit: Payer: Self-pay

## 2021-12-27 ENCOUNTER — Telehealth: Payer: Self-pay

## 2021-12-27 DIAGNOSIS — E1165 Type 2 diabetes mellitus with hyperglycemia: Secondary | ICD-10-CM

## 2021-12-27 DIAGNOSIS — I10 Essential (primary) hypertension: Secondary | ICD-10-CM

## 2021-12-27 LAB — KAPPA/LAMBDA LIGHT CHAINS
Kappa free light chain: 62.7 mg/L — ABNORMAL HIGH (ref 3.3–19.4)
Kappa, lambda light chain ratio: 2.71 — ABNORMAL HIGH (ref 0.26–1.65)
Lambda free light chains: 23.1 mg/L (ref 5.7–26.3)

## 2021-12-27 NOTE — Telephone Encounter (Signed)
   Telephone encounter was:  Successful.  12/27/2021 Name: Alexis Farmer MRN: [id number] DOB: [birth year]  Alexis Farmer is a 81 y.o. year old female who is a primary care patient of McLean-Scocuzza, Nino Glow, MD . The community resource team was consulted for assistance with Transportation Needs   Care guide performed the following interventions: Spoke with patient. scheduled transportation through [employer] [place] [phone]. Pick-up time 9:45am from home, 11:59am pick-up time after appointment. Patient will be sent a reminder call closer to the date of appointment.  Follow Up Plan:  No further follow up planned at this time. The patient has been provided with needed resources.  Keni Elison, AAS Paralegal, [facility] [employer]  [address], [place] [address] ??[email or web address]  ?? [phone]   [email or web address]

## 2021-12-28 LAB — COMP PANEL: LEUKEMIA/LYMPHOMA: Immunophenotypic Profile: 17

## 2021-12-28 LAB — ANTINUCLEAR ANTIBODIES, IFA: ANA Ab, IFA: NEGATIVE

## 2022-01-02 ENCOUNTER — Ambulatory Visit (INDEPENDENT_AMBULATORY_CARE_PROVIDER_SITE_OTHER): Payer: Medicare Other | Admitting: Podiatry

## 2022-01-02 ENCOUNTER — Encounter: Payer: Self-pay | Admitting: Podiatry

## 2022-01-02 DIAGNOSIS — B351 Tinea unguium: Secondary | ICD-10-CM | POA: Diagnosis not present

## 2022-01-02 DIAGNOSIS — M79675 Pain in left toe(s): Secondary | ICD-10-CM

## 2022-01-02 DIAGNOSIS — I739 Peripheral vascular disease, unspecified: Secondary | ICD-10-CM

## 2022-01-02 DIAGNOSIS — M79674 Pain in right toe(s): Secondary | ICD-10-CM

## 2022-01-02 DIAGNOSIS — E1142 Type 2 diabetes mellitus with diabetic polyneuropathy: Secondary | ICD-10-CM | POA: Diagnosis not present

## 2022-01-03 ENCOUNTER — Telehealth: Payer: Self-pay | Admitting: *Deleted

## 2022-01-03 NOTE — Telephone Encounter (Signed)
Patient called reporting that she had labs drawn 12/26/21 and she has not heard anything back about them, She is very anxious and would like a return call to go over her lab results Her appointment is not until 8/1 and she does not want to wait. Technologist smear review Order: [id number] Status: Final result    Visible to patient: No (inaccessible in [place])    Next appt: 01/08/2022 at 11:00 AM in Family Medicine ([facility])    Dx: Lymphocytosis    0 Result Notes    Component 8 d ago  WBC MORPHOLOGY Normal RBC, WBC, and platelet   RBC MORPHOLOGY Normal RBC, WBC, and platelet   Tech Review Normal RBC, WBC, and platelet   Comment: Performed at [facility] [address], [place], [place] [address]  Resulting Agency [facility]       Result Care Coordination   Patient Communication   Add Comments   Not seen Back to Top       Other Results from 12/26/2021   Contains abnormal data Comprehensive metabolic panel Order: [id number] Status: Final result    Visible to patient: No (inaccessible in MyChart)    Next appt: 01/08/2022 at 11:00 AM in Family Medicine [facility])    Dx: Lymphocytosis    0 Result Notes           Component Ref Range & Units 8 d ago (12/26/21) 3 wk ago (12/13/21) 4 mo ago (08/15/21) 4 mo ago (08/14/21) 4 mo ago (08/13/21) 4 mo ago (08/12/21) 8 mo ago (04/19/21)  Sodium 135 - 145 mmol/L 134 Low    138  136  136  133 Low   138 R   Potassium 3.5 - 5.1 mmol/L 4.1   3.8  3.9  3.6 CM  4.9  4.3 R   Chloride 98 - 111 mmol/L 101   104  101  100  94 Low   98 R   CO2 22 - 32 mmol/L _0 33 High  R   Glucose, Bld 70 - 99 mg/dL 223 High    258 High  CM  261 High  CM  268 High  CM  253 High  CM  171 High    Comment: Glucose reference range applies only to samples taken after fasting for at least 8 hours.  BUN 8 - 23 mg/dL 22   5 Low   _1 R   Creatinine, Ser 0.44 - 1.00 mg/dL 1.22 High    1.12 High   1.27 High    1.21 High   1.19 High   1.20 R   Calcium 8.9 - 10.3 mg/dL 10.0   9.2  8.5 Low   8.4 Low   9.4  10.6 High  R   Total Protein 6.5 - 8.1 g/dL 7.4  7.0 R   6.4 Low   6.0 Low   7.3  7.3 R   Albumin 3.5 - 5.0 g/dL 3.8    3.2 Low   3.0 Low   3.8  4.3 R   AST 15 - 41 U/L _2 60 High   23 R   ALT 0 - 44 U/L _3 34  21 R   Alkaline Phosphatase 38 - 126 U/L 36 Low     39  38  42  47 R   Total Bilirubin 0.3 - 1.2 mg/dL 0.7    0.7  0.6  2.1 High   1.2 R   GFR, Estimated >60 mL/min 44 Low    49 Low  CM  42 Low  CM  45 Low  CM  46 Low  CM    Comment: (NOTE)  Calculated using the CKD-EPI Creatinine Equation (2021)   Anion gap 5 - _0 CM  9 CM  7 CM  13 CM    Comment: Performed at [facility], [address] [place], [place] [address]  Resulting Agency  [place] [facility] [place] [facility] [place] [facility] [place] [facility] [place] [facility] [place]     CM=Additional comments  R=Reference range differs from displayed range      Result Care Coordination   Patient Communication   Add Comments   Not seen Back to Top         Hepatitis panel, acute Order: [id number] Status: Final result    Visible to patient: No (inaccessible in [place])    Next appt: 01/08/2022 at 11:00 AM in Family Medicine ([facility])    Dx: Screening due    0 Result Notes     Component Ref Range & Units 8 d ago  Hepatitis B Surface Ag NON REACTIVE NON REACTIVE   HCV Ab NON REACTIVE NON REACTIVE   Comment: (NOTE)  Nonreactive HCV antibody screen is consistent with no HCV infections,  unless recent infection is suspected or other evidence exists to  indicate HCV infection.    Hep A IgM NON REACTIVE NON REACTIVE   Hep B C IgM NON REACTIVE NON REACTIVE   Comment: Performed at [facility], [place] [address], [place], [place] [address]  Resulting Agency  [facility] CLIN LAB       Result Care Coordination   Patient Communication   Add Comments   Not seen Back to Top         Lactate  dehydrogenase Order: [id number] Status: Final result    Visible to patient: No (inaccessible in MyChart)    Next appt: 01/08/2022 at 11:00 AM in Family Medicine ([facility])    Dx: Lymphocytosis    0 Result Notes     Component Ref Range & Units 8 d ago  LDH 98 - 192 U/L 160   Comment: Performed at [facility], [address], [place], [place] [address]  [place]  [facility] CLIN LAB       Result Care Coordination   Patient Communication   Add Comments   Not seen Back to Top         ANA, IFA (with reflex) Order: [id number] Status: Final result    Visible to patient: No (inaccessible in [place])    Next appt: 01/08/2022 at 11:00 AM in Family Medicine ([facility]    Dx: Lymphocytosis    0 Result Notes    Component 8 d ago  ANA Ab, IFA Negative   Comment: (NOTE)                                      Negative   <1:80  Borderline  1:80                                      Positive   >1:80  ICAP nomenclature: AC-[id number]  For more information about Hep-2 cell patterns use  ANApatterns.org, the official website for the International  Consensus on Antinuclear Antibody (ANA) Patterns (ICAP).  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 The differential diagnosis includes atypical  chronic lymphocytic leukemia/small lymphocytic lymphoma, mantle cell  lymphoma, among other mature B-cell lymphoma with occasional CD5  co-expression, including marginal zone B-cell lymphoma and  lymphoplasmacytic lymphoma.  If the patient is known to have a B-cell leukemia or lymphoma, the  finding  represents involvement by the lymphoma.  If the patient is not known  to  have a B-cell leukemia or lymphoma, further investigation is  recommended if  clinically indicated. In the absence of B-cell lymphoma, small  (<5000/uL)  clonal B-cell populations in the blood are classified as monoclonal  B-cell  lymphocytosis.  Clinical correlation is required.  CLL FISH study  (including CCND1/IgH) is also recommended to exclude mantle cell  lymphoma  as clinically indicated.   CLINICAL INFO Comment VC   Comment: (NOTE)  Accompanying CBC dated 12/26/2021 shows: WBC count 6.9, Neu 1.6, Lym  4.5,  Mon 0.5   Specimen Type Comment   Comment: Peripheral blood  ASSESSMENT OF LEUKOCYTES Comment   Comment: (NOTE)  A partial CD5+ monoclonal B cell population is detected with kappa  light  chain restriction, non-specific phenotype, representing 17% of  leukocytes.  They are positive for CD19, CD20, CD22 and FMC-7 and negative for  CD10 and  CD103. They are partially positive for CD23.  CD38 is positive on 75%  of  clonal B-cells. Expression of CD38 on >30% of the clonal B-cells is  reported to be an unfavorable prognostic factor in CLL.  There is no loss of, or aberrant expression of, the pan T cell  antigens to  suggest a neoplastic T cell process.  CD4:CD8 ratio 1.9  CD57 positive cells are increased but are composed of a mixture of CD4  positive cells, CD8 positive cells and NK-cells, suggestive of a  reactive  process.  CD57 is a marker of large granular lymphocytes. Clinical  correlation is recommended.  No circulating blasts  are detected.  There is no immunophenotypic  evidence of abnormal myeloid maturation.  Analysis of the leukocyte population shows: granulocytes 25%,  monocytes 6%,  lymphocytes 69%, blasts <0.1%, B cells 19%, T cells 42%, NK cells 8%,  CD57+  LGL's 16%.   % Viable Cells Comment VC   Comment: 96%  Immunophenotypic Profile 17% of total cells (Phenotype below) VC   Comment: Comment  Abnormal cell population: present   ANALYSIS AND GATING STRATEGY Comment   Comment: (NOTE)  8 color analysis with CD45/SSC gating   Technical-Analysis performed at [facility], [place], [place], [place]  [address] INOM#[id number] Director: Katina Degree, MDPhD  [facility]  Performed at [facility], [address] [place],   [address]   IMMUNOPHENOTYPING STUDY Comment   Comment: (NOTE)  CD2       (-)            CD3       (-)  CD4       (-)            CD5       See Text  CD7       (-)            CD8       (-)  CD10      (-)            CD11b     (-)  CD11c     (+)            CD13      (-)  CD14      (-)            CD15      (-)  CD16      (-)            CD19      (+)  CD20      (+)            CD22      (+)  CD23      See Text       CD33      (-)  CD34      (-)            CD38      See Text  CD45      (+)            CD56      (-)  CD57      (-)            CD103     (-)  CD117     (-)            FMC-7     (+)  HLA-DR    (+)            KAPPA     (+)  LAMBDA    (-)            CD64      (-)   PATHOLOGIST NAME Comment   Comment: Katheran James, M.D. Ph.D  COMMENT: Comment VC   Comment: (NOTE)  Each antibody in this assay was utilized to assess for potential  abnormalities of studied cell populations or to characterize  identified abnormalities.  This test was developed and its performance characteristics  determined by [facility].  It has not been cleared or approved by the  U.S. Food and Drug Administration.  The FDA has determined that such clearance or approval is not   necessary. This test is used for clinical purposes.  It should not  be regarded as investigational or for research.  Performed At: [facility]  [place], [place] [id number]  Katina Degree MDPhD [id number]  Performed At: [facility]  [place], [place] [id number]  Katina Degree MDPhD [id number]  Performed At: [place]  [place], [place] [id number]  Katina Degree MD [id number]   Resulting Agency [facility] CLIN LAB         Specimen Collected: 12/26/21 12:08 Last Resulted: 12/28/21 12:36        CBC with Differential/Platelet Order: [id number] Status: Final result    Visible to patient: No (inaccessible in MyChart)    Next appt: 01/08/2022 at 11:00 AM in Family Medicine [facility])    Dx: Lymphocytosis    0 Result Notes           Component Ref Range & Units 8 d ago (12/26/21) 3 wk ago (12/13/21) 4 mo ago (08/14/21) 4 mo ago (08/13/21) 4 mo ago (08/12/21) 8 mo ago (04/19/21) 1 yr ago (12/29/20)  WBC 4.0 - 10.5 K/uL 6.9  6.6  6.8  6.5  6.9  7.0  7.8   RBC 3.87 - 5.11 MIL/uL 4.28  4.32 R  4.27  4.01  4.76  4.63 R  4.66 R   Hemoglobin 12.0 - 15.0 g/dL 12.5  12.6  12.2  11.5 Low   13.8  13.3  13.8   HCT 36.0 - 46.0 % 37.9  38.5  38.6  36.8  43.1  41.4  41.4   MCV 80.0 - 100.0 fL 88.6  89.2 R  90.4  91.8  90.5  89.3 R  88.9 R   MCH 26.0 - 34.0 pg 29.2   28.6  28.7  29.0     MCHC 30.0 - 36.0 g/dL 33.0  32.7  31.6  31.3  32.0  32.2  33.4   RDW 11.5 - 15.5 % 13.9  14.2  13.4  13.6  13.4  14.0  14.1   Platelets 150 - 400 K/uL 229  203.0 R  205  212  240  230.0 R  240.0 R   nRBC 0.0 - 0.2 % 0.0   0.0 CM  0.0 CM  0.0     Neutrophils Relative % % 23  31.0 Low  R    48  30.1 Low  R  37.9 Low  R   Neutro Abs 1.7 - 7.7 K/uL 1.6 Low   2.1 R    3.3  2.1 R  2.9 R   Lymphocytes Relative % 66  55.7 Repeated and verified X2. High  R    39  56.7 Repeated and verified X2. High  R  49.1 High  R   Lymphs Abs 0.7 - 4.0 K/uL 4.5 High   3.7     2.7  4.0  3.8   Monocytes Relative % 7  8.2 R    12  8.2 R  9.9 R   Monocytes Absolute 0.1 - 1.0 K/uL 0.5  0.5    0.8  0.6  0.8   Eosinophils Relative % 3  3.1 R    1  3.1 R  2.0 R   Eosinophils Absolute 0.0 - 0.5 K/uL 0.2  0.2 R    0.1  0.2 R  0.2 R   Basophils Relative % 1  2.0 R    0  1.9 R  1.1 R   Basophils Absolute 0.0 - 0.1 K/uL 0.1  0.1    0.0  0.1  0.1   Immature Granulocytes % 0     0     Abs Immature Granulocytes 0.00 - 0.07  K/uL 0.01     0.03 CM     Comment: Performed at [facility], [address] [place], [place] [address]  Resulting Agency  [facility] [place] [facility] [place] [facility] [place]         Specimen Collected: 12/26/21 12:08 Last Resulted: 12/26/21 12:28

## 2022-01-03 NOTE — Telephone Encounter (Signed)
Please see if pt can come this afternoon to discuss lab results, per MD.  All other days Dr. Tasia Catchings is pretty booked.

## 2022-01-03 NOTE — Telephone Encounter (Signed)
Pt scheduled to come in on 7/20. Abbie confirmed with pt.

## 2022-01-04 ENCOUNTER — Encounter: Payer: Self-pay | Admitting: Oncology

## 2022-01-04 ENCOUNTER — Inpatient Hospital Stay (HOSPITAL_BASED_OUTPATIENT_CLINIC_OR_DEPARTMENT_OTHER): Payer: Medicare Other | Admitting: Oncology

## 2022-01-04 VITALS — BP 177/69 | HR 60 | Temp 97.8°F | Resp 18 | Wt 240.1 lb

## 2022-01-04 DIAGNOSIS — D72820 Lymphocytosis (symptomatic): Secondary | ICD-10-CM | POA: Diagnosis not present

## 2022-01-04 DIAGNOSIS — N1832 Chronic kidney disease, stage 3b: Secondary | ICD-10-CM | POA: Diagnosis not present

## 2022-01-04 NOTE — Progress Notes (Signed)
Hematology/Oncology Progress note [phone] [phone]       REFERRING PROVIDER: McLean-Scocuzza, Nino Glow, MD  CHIEF COMPLAINTS/REASON FOR VISIT:  Follow up for leukocytosis  HISTORY OF PRESENTING ILLNESS:  Alexis Farmer is a  81 y.o.  female with PMH listed below who was referred to me for evaluation of leukocytosis Reviewed patient' recent labs obtained by PCP.   12/13/2021 CBC showed elevated white count of 6.6, increased lymphocyte percentage 55.7.  Absolute lymphocytes 3.7. Previous lab records reviewed. Leukocytosis onset of intermittent. Patient reports chronic arthritis, bilateral lower extremity swelling, left worse than right.  She was recently seen by dermatology for a skin lesion on her back.  Patient reports that her previously weighed 260 pounds.  Currently she weighed 240 pounds. Appetite is fair. No fever, night sweating.  Patient was accompanied by her granddaughter.   INTERVAL HISTORY Alexis Farmer is a 81 y.o. female who has above history reviewed by me today presents for follow up visit to review results.  No new complaints. She is very anxious to know her work up results.  She has been concerned about her weight loss.  Her weight in April was 240Ib and today she weighed 240Ib.  She was admitted due to ileus in Feb 2023 She was accompanied by her sister.   Review of Systems  Constitutional:  Positive for fatigue. Negative for appetite change, chills, fever and unexpected weight change.  HENT:   Negative for hearing loss and voice change.   Eyes:  Negative for eye problems.  Respiratory:  Negative for chest tightness and cough.   Cardiovascular:  Negative for chest pain.  Gastrointestinal:  Negative for abdominal distention, abdominal pain and blood in stool.  Endocrine: Negative for hot flashes.  Genitourinary:  Negative for difficulty urinating and frequency.   Musculoskeletal:  Positive for arthralgias.  Skin:  Negative  for itching.  Neurological:  Negative for extremity weakness.  Hematological:  Negative for adenopathy.  Psychiatric/Behavioral:  Negative for confusion.      MEDICAL HISTORY:  Past Medical History:  Diagnosis Date   Arthritis    Chickenpox    Diabetes mellitus without complication ([place])    one elevated reading/ no treatment   Diverticulitis    GI bleed    High cholesterol    History of blood transfusion    Hypertension    Renal insufficiency     SURGICAL HISTORY: Past Surgical History:  Procedure Laterality Date   APPENDECTOMY     CHOLECYSTECTOMY     ECTOPIC PREGNANCY SURGERY     EYE SURGERY     bilateral cataracts   EYE SURGERY     02/11/2019 repair hole in right eye    gallbladder      HYSTEROSCOPY WITH D & C N/A 10/26/2016   Procedure: DILATATION AND CURETTAGE /HYSTEROSCOPY;  Surgeon: Benjaman Kindler, MD;  Location: [facility];  Service: Gynecology;  Laterality: N/A;   HYSTEROSCOPY WITH D & C N/A 07/07/2018   Procedure: DILATATION AND CURETTAGE /HYSTEROSCOPY;  Surgeon: Benjaman Kindler, MD;  Location: [facility];  Service: Gynecology;  Laterality: N/A;   THYROIDECTOMY, PARTIAL      SOCIAL HISTORY: Social History   Socioeconomic History   Marital status: Widowed    Spouse name: Not on file   Number of children: 1   Years of education: 23   Highest education level: 12th grade  Occupational History   Occupation: retired    Comment: hx of Pharmacist, hospital, housekeeper, 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  Tobacco Use   Smoking status: Never   Smokeless tobacco: Never  Vaping Use   Vaping Use: Never used  Substance and Sexual Activity   Alcohol use: No   Drug use: No   Sexual activity: Not Currently  Other Topics Concern   Not on file  Social History Narrative   Lives alone    From [place]   Widowed - was married 3 times starting at age 53    hx of seamtress, 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    Social Determinants of Health   Financial Resource Strain: Low Risk  (09/28/2020)   Overall Financial Resource Strain (CARDIA)    Difficulty of Paying Living Expenses: Not hard at all  Food Insecurity: No Food Insecurity (09/28/2020)   Hunger Vital Sign    Worried About Running Out of Food in the Last Year: Never true    Ran Out of Food in the Last Year: Never true  Recent Concern: Food Insecurity - Food Insecurity Present (08/01/2020)   Hunger Vital Sign    Worried About Running Out of Food in the Last Year: Sometimes true    Ran Out of Food in the Last Year: Never true  Transportation Needs: No Transportation Needs (09/28/2020)   PRAPARE - Hydrologist (Medical): No    Lack of Transportation (Non-Medical): No  Physical Activity: Inactive (05/05/2020)   Exercise Vital Sign    Days of Exercise per Week: 0 days    Minutes of Exercise per Session: 0 min  Stress: Stress Concern Present (08/23/2020)   [place]    Feeling of Stress : To some extent  Social Connections: Socially Isolated (08/23/2020)   Social Connection and Isolation Panel [NHANES]    Frequency of Communication with Friends and Family: More than three times a week    Frequency of Social Gatherings with Friends and Family: More than three times a week    Attends Religious Services: Never    Marine scientist or Organizations: No    Attends Archivist Meetings: Never    Marital Status: Widowed  Intimate Partner Violence: Unknown (09/28/2020)   Humiliation, Afraid, Rape, and Kick questionnaire    Fear of Current or Ex-Partner: No    Emotionally Abused: No    Physically Abused: Not on file    Sexually Abused: No    FAMILY HISTORY: Family History  Problem Relation Age of Onset   Diabetes Mother    Hypertension Mother    Stroke Mother    Diabetes Other    Healthy Father    Diabetes Sister    Heart disease  Sister     ALLERGIES:  is allergic to celexa [citalopram], jardiance [empagliflozin], norvasc [amlodipine], tape, penicillin v, and penicillins.  MEDICATIONS:  Current Outpatient Medications  Medication Sig Dispense Refill   aspirin EC 81 MG tablet Take 81 mg by mouth 2 (two) times a week. Swallow whole.     cloNIDine (CATAPRES) 0.2 MG tablet Take 1 tablet (0.2 mg total) by mouth 2 (two) times daily. 270 tablet 3   hydrALAZINE (APRESOLINE) 100 MG tablet Take 1 tablet (100 mg total) by mouth in the morning, at noon, in the evening, and at bedtime. 360 tablet 1   LORazepam (ATIVAN) 0.5 MG tablet Take 1 tablet (0.5 mg total) by mouth daily as needed for anxiety. D/c 0.25 30 tablet 5  rosuvastatin (CRESTOR) 20 MG tablet TAKE 1 TABLET BY MOUTH EVERY DAY 90 tablet 3   spironolactone (ALDACTONE) 25 MG tablet Take 1 tablet (25 mg total) by mouth daily. 90 tablet 3   torsemide (DEMADEX) 10 MG tablet Take 1 tablet (10 mg total) by mouth daily. 90 tablet 3   valsartan (DIOVAN) 160 MG tablet Take 1 tablet (160 mg total) by mouth daily. 90 tablet 1   ACCU-CHEK GUIDE test strip USE AS INSTRUCTED 3 TIMES DAILY (Patient not taking: Reported on 12/18/2021) 300 strip 12   acetaminophen (TYLENOL) 500 MG tablet Take 500 mg by mouth every 6 (six) hours as needed for mild pain or headache. (Patient not taking: Reported on 12/26/2021)     Ascorbic Acid (VITAMIN C PO) Take 1 tablet by mouth daily. (Patient not taking: Reported on 12/18/2021)     BD INSULIN SYRINGE U/F 30G X 1/2" 0.5 ML MISC USE UP TO 5X PER DAY WITH INSULIN 2X (NPH) AND 3X (REGULAR) (Patient not taking: Reported on 12/18/2021) 500 each 12   blood glucose meter kit and supplies KIT Accu chek, Dx code E11.65, check 3 times daily (Patient not taking: Reported on 12/18/2021) 1 each 0   Blood Pressure KIT 1 Device by Does not apply route daily. (Patient not taking: Reported on 12/18/2021) 1 kit 0   insulin NPH Human (NOVOLIN N) 100 UNIT/ML injection INJECT 20 UNITS  IN THE MORNING AND THE EVENING WITH A MEAL (Patient not taking: Reported on 12/26/2021) 40 mL PRN   insulin regular (NOVOLIN R) 100 units/mL injection Inject 0-0.08 mLs (0-8 Units total) into the skin 3 (three) times daily before meals. (Patient not taking: Reported on 12/26/2021) 30 mL 11   Multiple Vitamins-Minerals (CENTRUM SILVER 50+WOMEN) TABS Take 1 tablet by mouth daily with breakfast. (Patient not taking: Reported on 12/13/2021)     nebivolol (BYSTOLIC) 10 MG tablet Take 1 tablet (10 mg total) by mouth daily. (Patient not taking: Reported on 12/26/2021) 90 tablet 1   nystatin (MYCOSTATIN/NYSTOP) powder Apply 1 application. topically 2 (two) times daily. Under abdomen (Patient not taking: Reported on 12/26/2021) 60 g 11   ondansetron (ZOFRAN) 4 MG tablet Take 1 tablet (4 mg total) by mouth every 6 (six) hours as needed for nausea. (Patient not taking: Reported on 12/13/2021) 20 tablet 0   polyethylene glycol powder (GLYCOLAX/MIRALAX) 17 GM/SCOOP powder Take 17 g by mouth daily as needed for moderate constipation or severe constipation. Can take up to 2x per day prn. Mix with 8 ounces of liquid (Patient not taking: Reported on 12/26/2021) 3350 g 11   PYRIDOXINE HCL PO Take 1 tablet by mouth daily. (Patient not taking: Reported on 12/26/2021)     No current facility-administered medications for this visit.     PHYSICAL EXAMINATION: ECOG PERFORMANCE STATUS: 2 - Symptomatic, <50% confined to bed Vitals:   01/04/22 1351  BP: (!) 177/69  Pulse: 60  Resp: 18  Temp: 97.8 F (36.6 C)   Filed Weights   01/04/22 1351  Weight: 240 lb 1.6 oz (108.9 kg)    Physical Exam Constitutional:      General: She is not in acute distress.    Appearance: She is obese.  HENT:     Head: Normocephalic and atraumatic.  Eyes:     General: No scleral icterus. Cardiovascular:     Rate and Rhythm: Normal rate and regular rhythm.     Heart sounds: Normal heart sounds.  Pulmonary:     Effort: Pulmonary  effort is  normal. No respiratory distress.     Breath sounds: No wheezing.  Abdominal:     General: Bowel sounds are normal. There is no distension.     Palpations: Abdomen is soft.  Musculoskeletal:        General: No deformity. Normal range of motion.     Cervical back: Normal range of motion and neck supple.     Right lower leg: Edema present.     Left lower leg: Edema present.  Skin:    General: Skin is warm and dry.     Findings: No erythema or rash.  Neurological:     Mental Status: She is alert and oriented to person, place, and time. Mental status is at baseline.     Cranial Nerves: No cranial nerve deficit.     Coordination: Coordination normal.  Psychiatric:        Mood and Affect: Mood normal.     Labs are reviewed by me.     Latest Ref Rng & Units 12/26/2021   12:08 PM 12/13/2021   10:33 AM 08/14/2021    9:43 AM  CBC  WBC 4.0 - 10.5 K/uL 6.9  6.6  6.8   Hemoglobin 12.0 - 15.0 g/dL 12.5  12.6  12.2   Hematocrit 36.0 - 46.0 % 37.9  38.5  38.6   Platelets 150 - 400 K/uL 229  203.0  205       Latest Ref Rng & Units 12/26/2021   12:08 PM 12/13/2021   10:33 AM 08/15/2021    9:26 AM  CMP  Glucose 70 - 99 mg/dL 223   258   BUN 8 - 23 mg/dL 22   5   Creatinine 0.44 - 1.00 mg/dL 1.22   1.12   Sodium 135 - 145 mmol/L 134   138   Potassium 3.5 - 5.1 mmol/L 4.1   3.8   Chloride 98 - 111 mmol/L 101   104   CO2 22 - 32 mmol/L 27   27   Calcium 8.9 - 10.3 mg/dL 10.0   9.2   Total Protein 6.5 - 8.1 g/dL 7.4  7.0    Total Bilirubin 0.3 - 1.2 mg/dL 0.7     Alkaline Phos 38 - 126 U/L 36     AST 15 - 41 U/L 26     ALT 0 - 44 U/L 21         RADIOGRAPHIC STUDIES: I have personally reviewed the radiological images as listed and agreed with the findings in the report. VAS US CAROTID  Result Date: 10/10/2021 Carotid Arterial Duplex Study Patient Name:  DALICIA KISNER  Date of Exam:   10/10/2021 Medical Rec #: [id number]         Accession #:    [id number] Date of Birth: 20-Nov-1940          Patient Gender: F Patient Age:   70 years Exam Location:  [facility] Procedure:      VAS US CAROTID Referring Phys: Jamelle Haring --------------------------------------------------------------------------------  Indications:       Carotid artery disease. Risk Factors:      Hypertension, hyperlipidemia. Other Factors:     Known right subclavian syndrome. Comparison Study:  10/04/2020                    R=1-39, L=1-39 Performing Technologist: Ronal Fear RVS, RCS  Examination Guidelines: A complete evaluation includes B-mode imaging, spectral Doppler, color Doppler, and power Doppler as needed  of all accessible portions of each vessel. Bilateral testing is considered an integral part of a complete examination. Limited examinations for reoccurring indications may be performed as noted.  Right Carotid Findings: +----------+--------+--------+--------+------------------+--------+           PSV cm/sEDV cm/sStenosisPlaque DescriptionComments +----------+--------+--------+--------+------------------+--------+ CCA Prox  60                                                 +----------+--------+--------+--------+------------------+--------+ CCA Mid   75      13                                         +----------+--------+--------+--------+------------------+--------+ CCA Distal117     12              heterogenous               +----------+--------+--------+--------+------------------+--------+ ICA Prox  194     14      1-39%   heterogenous               +----------+--------+--------+--------+------------------+--------+ ICA Mid   132     24              heterogenous               +----------+--------+--------+--------+------------------+--------+ ICA Distal92      17                                         +----------+--------+--------+--------+------------------+--------+ ECA       187                                                 +----------+--------+--------+--------+------------------+--------+ +----------+--------+-------+----------+-------------------+           PSV cm/sEDV cmsDescribe  Arm Pressure (mmHG) +----------+--------+-------+----------+-------------------+ [id number]             monophasic                    +----------+--------+-------+----------+-------------------+ +---------+--------+--------+----------+ VertebralPSV cm/sEDV cm/sRetrograde +---------+--------+--------+----------+  Left Carotid Findings: +----------+--------+--------+--------+------------------+--------+           PSV cm/sEDV cm/sStenosisPlaque DescriptionComments +----------+--------+--------+--------+------------------+--------+ CCA Prox  101     12                                         +----------+--------+--------+--------+------------------+--------+ CCA Mid   88      13                                         +----------+--------+--------+--------+------------------+--------+ CCA Distal81      12              heterogenous               +----------+--------+--------+--------+------------------+--------+ ICA Prox  59      8  1-39%   heterogenous               +----------+--------+--------+--------+------------------+--------+ ICA Mid   94      16                                         +----------+--------+--------+--------+------------------+--------+ ICA Distal101     19                                         +----------+--------+--------+--------+------------------+--------+ ECA       163                                                +----------+--------+--------+--------+------------------+--------+ +----------+--------+--------+----------------+-------------------+           PSV cm/sEDV cm/sDescribe        Arm Pressure (mmHG) +----------+--------+--------+----------------+-------------------+ Subclavian216             Multiphasic, WNL                     +----------+--------+--------+----------------+-------------------+ +---------+--------+---+--------+--+---------+ VertebralPSV cm/s115EDV cm/s15Antegrade +---------+--------+---+--------+--+---------+   Summary: Right Carotid: Velocities in the right ICA are consistent with a 1-39% stenosis. Left Carotid: Velocities in the left ICA are consistent with a 1-39% stenosis. Vertebrals:  Left vertebral artery demonstrates antegrade flow. Right vertebral              artery demonstrates retrograde flow. Subclavians: Normal flow hemodynamics were seen in the left subclavian artery.              Right monophasic. *See table(s) above for measurements and observations.  Electronically signed by Jamelle Haring on 10/10/2021 at 6:07:37 PM.    Final      LABORATORY DATA:  I have reviewed the data as listed    Latest Ref Rng & Units 12/26/2021   12:08 PM 12/13/2021   10:33 AM 08/14/2021    9:43 AM  CBC  WBC 4.0 - 10.5 K/uL 6.9  6.6  6.8   Hemoglobin 12.0 - 15.0 g/dL 12.5  12.6  12.2   Hematocrit 36.0 - 46.0 % 37.9  38.5  38.6   Platelets 150 - 400 K/uL 229  203.0  205     Recent Labs    08/13/21 0025 08/14/21 0943 08/15/21 0926 12/13/21 1033 12/26/21 1208  NA 136 136 138  --  134*  K 3.6 3.9 3.8  --  4.1  CL 100 101 104  --  101  CO2 '29 26 27  ' --  27  GLUCOSE 268* 261* 258*  --  223*  BUN 10 12 5*  --  22  CREATININE 1.21* 1.27* 1.12*  --  1.22*  CALCIUM 8.4* 8.5* 9.2  --  10.0  GFRNONAA 45* 42* 49*  --  44*  PROT 6.0* 6.4*  --  7.0 7.4  ALBUMIN 3.0* 3.2*  --   --  3.8  AST 26 31  --   --  26  ALT 24 25  --   --  21  ALKPHOS 38 39  --   --  36*  BILITOT 0.6 0.7  --   --  0.7  Iron/TIBC/Ferritin/ %Sat    Component Value Date/Time   IRON 78 12/13/2021 1033   TIBC 341.6 12/13/2021 1033   FERRITIN 246.9 12/13/2021 1033   IRONPCTSAT 22.8 12/13/2021 1033        ASSESSMENT & PLAN:  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 Stage 3b chronic kidney  disease (HCC)    #Monoclonal lymphocytosis of unknown significance.  Labs are reviewed and discussed with patient. 12/26/21 peripheral blood flowcytometry showed CD5 positive monoclonal B cell lymphocytes, 17% of leukocytes, <5000/ul, phenotype is non specific.  She is asymptomatic, previous weight loss could be due to her admission due to ileus. No further weight loss within last 3 months.  08/12/21 CT abdomen pelvis did not show any lymphadenopathy. Physical examination did not showed lymphadenopathy.  I recommend observation.  Attempt to call granddaughter during this encounter, not able to reach   #Chronic kidney disease, avoid nephrotoxin.  SPEP is negative for M protein, slight increased of light chain ratio, non specific. Check UPEP 24 hours at next visit.   Follow up in 3 months, labs prior to MD . Orders Placed This Encounter  Procedures   CBC with Differential/Platelet    Standing Status:   Future    Standing Expiration Date:   01/05/2023   Comprehensive metabolic panel    Standing Status:   Future    Standing Expiration Date:   01/05/2023   Lactate dehydrogenase    Standing Status:   Future    Standing Expiration Date:   01/05/2023   Protein Electrophoresis, Urine Rflx.    Standing Status:   Future    Standing Expiration Date:   01/05/2023    All questions were answered. The patient knows to call the clinic with any problems questions or concerns.  Earlie Server, MD, PhD Hematology Oncology 01/04/2022

## 2022-01-05 ENCOUNTER — Telehealth: Payer: Self-pay | Admitting: Oncology

## 2022-01-05 NOTE — Telephone Encounter (Signed)
pt called in to have appts moved to diff time slot

## 2022-01-07 NOTE — Progress Notes (Signed)
  Subjective:  Patient ID: Alexis Farmer, female    DOB: January 03, 1941,  MRN: [id number]  Alexis Farmer presents to clinic today for at risk footcare. Patient has h/o diabetes, neuropathy and PAD and is seen for  and painful thick toenails that are difficult to trim. Pain interferes with ambulation. Aggravating factors include wearing enclosed shoe gear. Pain is relieved with periodic professional debridement.  Patient states blood glucose was 160 mg/dl today.  Last A1c was 9.6%.  New problem(s): None.   Patient states she is being worked up for elevated white blood cell count.  PCP is McLean-Scocuzza, Nino Glow, MD , and last visit was  December 13, 2021  Allergies  Allergen Reactions   Celexa [Citalopram]     Diarrhea upset stomach    Jardiance [Empagliflozin] Other (See Comments)    Reaction not recalled   Norvasc [Amlodipine]     Leg edema   Tape Other (See Comments)    Leaves the skin "raw" if left on for a period of time- tolerates paper tape   Penicillin V Rash   Penicillins Rash    Has patient had a PCN reaction causing immediate rash, facial/tongue/throat swelling, SOB or lightheadedness with hypotension: Yes Has patient had a PCN reaction causing severe rash involving mucus membranes or skin necrosis: No Has patient had a PCN reaction that required hospitalization No Has patient had a PCN reaction occurring within the last 10 years: Yes If all of the above answers are "NO", then may proceed with Cephalosporin use.     Review of Systems: Negative except as noted in the HPI.  Objective: No changes noted in today's physical examination.  General: Patient is a pleasant 81 y.o. African American female morbidly obese in NAD. AAO x 3.   Neurovascular Examination: CFT <3 seconds b/l LE. Nonpalpable DP pulse(s) b/l LE. Nonpalpable PT pulse(s) b/l LE. Pedal hair absent. No pain with calf compression b/l. Nonpitting edema noted BLE. Evidence of chronic venous insufficiency b/l LE.  No cyanosis or clubbing noted b/l LE.  Protective sensation decreased with 10 gram monofilament b/l.  Dermatological:  Pedal integument with normal turgor, texture and tone b/l LE. No open wounds b/l. No interdigital macerations b/l. Toenails 1-4 bilaterally and right 5th toe elongated, thickened, discolored with subungual debris. +Tenderness with dorsal palpation of nailplates. Anonychia noted L 5th toe. Nailbed(s) epithelialized.   Musculoskeletal:  Muscle strength 5/5 to all lower extremity muscle groups bilaterally. Pes planus deformity noted bilateral LE. Utilizes walker for ambulation assistance.     Latest Ref Rng & Units 12/13/2021   10:33 AM 08/13/2021   12:25 AM 04/19/2021    1:51 PM  Hemoglobin A1C  Hemoglobin-A1c 4.6 - 6.5 % 9.6  8.8  9.9    Assessment/Plan: 1. Pain due to onychomycosis of toenails of both feet   2. PAD (peripheral artery disease) ([place])   3. Diabetic peripheral neuropathy associated with type 2 diabetes mellitus ([place])     -Examined patient. -Patient to continue soft, supportive shoe gear daily. -Mycotic toenails 1-4 bilaterally and R 5th toe were debrided in length and girth with sterile nail nippers and dremel without iatrogenic bleeding. -Patient/POA to call should there be question/concern in the interim.   Return in about 3 months (around 04/04/2022).  [name], DPM

## 2022-01-08 ENCOUNTER — Ambulatory Visit (INDEPENDENT_AMBULATORY_CARE_PROVIDER_SITE_OTHER): Payer: Medicare Other | Admitting: *Deleted

## 2022-01-08 DIAGNOSIS — E1129 Type 2 diabetes mellitus with other diabetic kidney complication: Secondary | ICD-10-CM

## 2022-01-08 DIAGNOSIS — E1159 Type 2 diabetes mellitus with other circulatory complications: Secondary | ICD-10-CM

## 2022-01-08 NOTE — Chronic Care Management (AMB) (Signed)
Chronic Care Management   CCM RN Visit Note  01/08/2022 Name: Alexis Farmer MRN: [id number] DOB: 08/04/1940  Subjective: Alexis Farmer is a 81 y.o. year old female who is a primary care patient of McLean-Scocuzza, Nino Glow, MD. The care management team was consulted for assistance with disease management and care coordination needs.    Engaged with patient by telephone for follow up visit in response to provider referral for case management and/or care coordination services.   Consent to Services:  The patient was given information about Chronic Care Management services, agreed to services, and gave verbal consent prior to initiation of services.  Please see initial visit note for detailed documentation.   Patient agreed to services and verbal consent obtained.   Assessment: Review of patient past medical history, allergies, medications, health status, including review of consultants reports, laboratory and other test data, was performed as part of comprehensive evaluation and provision of chronic care management services.   SDOH (Social Determinants of Health) assessments and interventions performed:    CCM Care Plan  Allergies  Allergen Reactions   Celexa [Citalopram]     Diarrhea upset stomach    Jardiance [Empagliflozin] Other (See Comments)    Reaction not recalled   Norvasc [Amlodipine]     Leg edema   Tape Other (See Comments)    Leaves the skin "raw" if left on for a period of time- tolerates paper tape   Penicillin V Rash   Penicillins Rash    Has patient had a PCN reaction causing immediate rash, facial/tongue/throat swelling, SOB or lightheadedness with hypotension: Yes Has patient had a PCN reaction causing severe rash involving mucus membranes or skin necrosis: No Has patient had a PCN reaction that required hospitalization No Has patient had a PCN reaction occurring within the last 10 years: Yes If all of the above answers are "NO", then may proceed with  Cephalosporin use.     Outpatient Encounter Medications as of 01/08/2022  Medication Sig Note   ACCU-CHEK GUIDE test strip USE AS INSTRUCTED 3 TIMES DAILY (Patient not taking: Reported on 12/18/2021)    acetaminophen (TYLENOL) 500 MG tablet Take 500 mg by mouth every 6 (six) hours as needed for mild pain or headache. (Patient not taking: Reported on 12/26/2021)    Ascorbic Acid (VITAMIN C PO) Take 1 tablet by mouth daily. (Patient not taking: Reported on 12/18/2021)    aspirin EC 81 MG tablet Take 81 mg by mouth 2 (two) times a week. Swallow whole. 08/12/2021: No specific days    BD INSULIN SYRINGE U/F 30G X 1/2" 0.5 ML MISC USE UP TO 5X PER DAY WITH INSULIN 2X (NPH) AND 3X (REGULAR) (Patient not taking: Reported on 12/18/2021)    blood glucose meter kit and supplies KIT Accu chek, Dx code E11.65, check 3 times daily (Patient not taking: Reported on 12/18/2021)    Blood Pressure KIT 1 Device by Does not apply route daily. (Patient not taking: Reported on 12/18/2021)    cloNIDine (CATAPRES) 0.2 MG tablet Take 1 tablet (0.2 mg total) by mouth 2 (two) times daily.    hydrALAZINE (APRESOLINE) 100 MG tablet Take 1 tablet (100 mg total) by mouth in the morning, at noon, in the evening, and at bedtime.    insulin NPH Human (NOVOLIN N) 100 UNIT/ML injection INJECT 20 UNITS IN THE MORNING AND THE EVENING WITH A MEAL (Patient not taking: Reported on 12/26/2021)    insulin regular (NOVOLIN R) 100 units/mL injection Inject 0-0.08 mLs (0-8  Units total) into the skin 3 (three) times daily before meals. (Patient not taking: Reported on 12/26/2021)    LORazepam (ATIVAN) 0.5 MG tablet Take 1 tablet (0.5 mg total) by mouth daily as needed for anxiety. D/c 0.25    Multiple Vitamins-Minerals (CENTRUM SILVER 50+WOMEN) TABS Take 1 tablet by mouth daily with breakfast. (Patient not taking: Reported on 12/13/2021)    nebivolol (BYSTOLIC) 10 MG tablet Take 1 tablet (10 mg total) by mouth daily. (Patient not taking: Reported on 12/26/2021)     nystatin (MYCOSTATIN/NYSTOP) powder Apply 1 application. topically 2 (two) times daily. Under abdomen (Patient not taking: Reported on 12/26/2021)    ondansetron (ZOFRAN) 4 MG tablet Take 1 tablet (4 mg total) by mouth every 6 (six) hours as needed for nausea. (Patient not taking: Reported on 12/13/2021)    polyethylene glycol powder (GLYCOLAX/MIRALAX) 17 GM/SCOOP powder Take 17 g by mouth daily as needed for moderate constipation or severe constipation. Can take up to 2x per day prn. Mix with 8 ounces of liquid (Patient not taking: Reported on 12/26/2021)    PYRIDOXINE HCL PO Take 1 tablet by mouth daily. (Patient not taking: Reported on 12/26/2021)    rosuvastatin (CRESTOR) 20 MG tablet TAKE 1 TABLET BY MOUTH EVERY DAY    spironolactone (ALDACTONE) 25 MG tablet Take 1 tablet (25 mg total) by mouth daily.    torsemide (DEMADEX) 10 MG tablet Take 1 tablet (10 mg total) by mouth daily.    valsartan (DIOVAN) 160 MG tablet Take 1 tablet (160 mg total) by mouth daily.    No facility-administered encounter medications on file as of 01/08/2022.    Patient Active Problem List   Diagnosis Date Noted   Skin cyst 10/24/2021   [place] 10/24/2021   Ileus ([place]) 08/22/2021   Gastroenteritis 08/12/2021   Lymphedema 04/19/2021   Atherosclerosis of aorta ([place]) 01/16/2021   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/16/2021   Spinal stenosis of lumbar region 01/16/2021   Stage 3a chronic kidney disease ([place]) 01/15/2021   Atypical chest pain 01/15/2021   Panic attack 01/15/2021   Hypertensive urgency 12/15/2020   Palpitations 12/15/2020   Elevated troponin 12/15/2020   Other chronic pain 08/25/2020   Hypertension associated with diabetes ([place]) 08/25/2020   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   Pure hypercholesterolemia 08/10/2020   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 ([place]) 05/30/2020   Retinopathy 05/30/2020   Primary hypertension 03/25/2020   Multinodular goiter  02/04/2020   Peripheral edema 12/22/2019   Proteinuria 12/22/2019   Constipation 11/17/2019   Recurrent falls 08/13/2019   Physical deconditioning 08/13/2019   Abdominal aortic atherosclerosis ([place]) 07/24/2019   Morbid obesity with BMI of 45.0-49.9, adult ([place]) 06/09/2019   Hypercalcemia 03/18/2019   Round hole, unspecified eye 02/06/2019   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   Disorder of rotator cuff 06/24/2018   Abnormal gait 03/26/2018   Fall 03/26/2018   Arthritis 03/26/2018   Thyroid nodule 01/20/2018   Dysuria 03/22/2017   Cervical spondylosis with radiculopathy 01/26/2017   Radiculopathy due to cervical spondylosis 01/26/2017   Rectal hemorrhage 07/29/2016   Carotid artery stenosis 07/19/2016   PAD (peripheral artery disease) ([place]) 07/19/2016   Swelling of lower leg 07/19/2016   Bradycardia 07/17/2016   Postmenopausal bleeding 07/17/2016   Chronic diastolic CHF (congestive heart failure) ([place]) 05/31/2016   Cough 01/25/2016   Chronic fatigue 11/15/2015   Depression, recurrent ([place]) 10/13/2015   Osteoarthritis 10/13/2015   Vertigo 10/13/2015   Anxiety  09/13/2015   Gastrointestinal hemorrhage 08/03/2015   Hyperglycemia due to type 2 diabetes mellitus ([place]) 03/11/2015   Mixed hyperlipidemia 08/26/2014   Essential hypertension 04/08/2014   Diabetes mellitus type II, uncontrolled 04/08/2014   Chronic kidney disease, stage 2 (mild) 04/08/2014   Edema of foot 04/08/2014   Morbid obesity ([place]) 04/08/2014   Low back pain 04/08/2014   Diabetic polyneuropathy ([place]) 04/08/2014   Type 2 diabetes mellitus with other diabetic kidney complication ([place]) 25/10/3974    Conditions to be addressed/monitored:CHF, HTN, and DMII  Care Plan : RNCM  Diabetes Type 2 & Heart Failure (Adult)  RESOLVING DUE TO DUPLICATE GOALS/TRANSITIONING TO CARE COORDINATION  Updates made by Leona Singleton, RN since 01/08/2022 12:00 AM  Completed 01/08/2022   Problem:  Glycemic & Heart Failure Management (Diabetes, Type 2) Resolved 01/08/2022  Priority: Medium  Note:   RESOLVING DUE TO DUPLICATE GOALS/TRANSITIONING TO CARE COORDINATION    Long-Range Goal: Patient will verbalize decrease in Hgb A1C by 0.3 points within the next 90 days Completed 01/08/2022  Start Date: 09/28/2020  Expected End Date: 08/15/2021  Recent Progress: Not on track  Priority: Medium  Note:   RESOLVING DUE TO DUPLICATE GOALS/TRANSITIONING TO CARE COORDINATION  (7/24--PATIENT NOT HAPPY WITH NEW ONCOLOGY DIAGNOSIS AN WISHES NOT TO SPEAK ABOUT, ACKNOWLEDGES FEELINGS OF BEING DOWN; OFFERED [facility] LCSW REFERRAL, PATIENT DECLINES AT THIS TIME )   Objective:  Lab Results  Component Value Date   HGBA1C 8.8 (H) 08/13/2021  Current Barriers:  Knowledge Deficits related to basic Diabetes pathophysiology and self care/management as evidenced by continued elevated blood sugars Lack of self motivation to self management of diabetes Knowledge deficit related to basic heart failure pathophysiology and self care management as evidenced by not knowing heart failure diagnosis and treatment/self care management.  3/29--recent hospitalization for ileus/abdominal pain.  Patient stating she feels much better.  Taking  Miralax as needed.  Last bowel movement was this morning.  Mood is much Better than last week.  Was able to arrange transportation.  Checking blood pressure, 146/64 what it has been ranging.  Does not have a scale to weigh.  Blood sugar this morning 99 (low for patent) then up to 262 after treatment, reporting recent ranges of 180-200's. Working with home health physical therapy 4/28--Continues to work with home health physical therapy.  BP 164/68  CBG 166; does report periods of hypoglycemia down to 60-70's  states she treats the lows then has rebound highs; discussed trying to prevent lows and highs.  A1C increased to 9.9 with Endocrinology 6/23--States she has completed home health therapy.   Denies any recent falls.  Reports blood pressures have been better with recent ranges of 130-150/60-70's on left arm.  Fasting blood sugar this morning was 280 after treating what she felt was a low at 139 (reports she cannot sleep with blood sugar less than 200).  Also reports mole to her back that is painful, awaiting dermatology referral Case Manager Clinical Goal(S, c  patient will demonstrate improved adherence to prescribed treatment plan for diabetes self care/management as evidenced by: at least 4 times a day monitoring and recording of CBG,  adherence to ADA/ carb modified diet, adherence to prescribed medication regimen, contacting provider for new or worsened symptoms or questions  Diabetes Interventions:  (Status: Goal on track: NO.)  Long Term Goal  Collaboration with McLean-Scocuzza, Nino Glow, MD regarding development and update of comprehensive plan of care as evidenced by provider attestation and co-signature Inter-disciplinary care team  collaboration (see longitudinal plan of care) Provided education to patient about basic DM disease process Reviewed medications with patient and discussed importance of medication adherence Discussed plans with patient for ongoing care management follow up and provided patient with direct contact information for care management team Provided patient with verbal educational materials related to hypo and hyperglycemia and importance of correct treatment Advised patient, providing education and rationale, to check cbg at least 4 times a day and record, calling primary care provider or endocrinologist after consultation for findings outside established parameters.   Discussed diabetic carbohydrate modified diet and foods to limit or avoid; Provided medical nutrition therapy and development of individualized eating Discussed current Hgb A1C and what is normal and what is patient's goal; per patient her goal is below 10-discussed and encouraged patient to  review goal with provider and to consider changing goal to lower Promoted self-monitoring of blood glucose levels.  Assessed and addressed barriers to management plan, such as food insecurity, age, developmental ability, depression, anxiety, fear of hypoglycemia or weight gain, as well as medication cost, side effects and complicated regimen Encouraged to keep and attend scheduled medical appointments Congratulated on weight loss and encouraged planned dietary changes Emotional support and empathy provided to patient Encouraged to spend more quality time with family Discussed diabetic shoes and foot care Reviewed current blood sugar readings and discussed hypoglycemia prevention and ways to help achieve better glycemic control; discussed how to treat hypoglycemia to prevent rebound hyperglycemia  Heart Failure Interventions:  (Status: Goal on track: NO.)  Long Term Goal  Basic overview and discussion of pathophysiology of Heart Failure reviewed Provided education on low sodium diet Reviewed Heart Failure Action Plan in depth and provided written copy Assessed need for readable accurate scales in home Provided education about placing scale on hard, flat surface Advised patient to weigh each morning after emptying bladder Discussed importance of daily weight and advised patient to weigh and record daily Reviewed role of diuretics in prevention of fluid overload and management of heart failure Discussed the importance of keeping all appointments with provider  Again encouraged patient to obtain scale for home Discussed importance of daily weight monitoring and when to call provider based  weights Encouraged to continue to monitor blood pressures in left arm and notify provider for sustained elevations  Patient Goals/Self-Care Activities: Check blood sugar at least 4 times a day Check blood sugar if I feel it is too high or too low Enter blood sugar readings and medication/insulin into daily  log Discuss goal A1C with endocrinology  Avoid or limit Carbs/Sweets/Sugars Take insulins as prescribed (25 Units in am and 15 Units in pm) Call office if I gain more than 2 pounds in one day or 5 pounds in one week Obtain home scale Use salt in moderation Watch for swelling in feet, ankles and legs every day; elevate legs when sitting Weigh myself daily and write weights in log Take log to all provider appointments Take BP in Left arm, recording reading for provider review Follow Up Plan: The care management team will reach out to the patient again over the next 45 business days.        Plan:The care management team will reach out to the patient again over the next 45 days.   [name] RN, MSN RN Care Management Coordinator [name] [phone] [email or web address]

## 2022-01-08 NOTE — Patient Instructions (Signed)
Visit Information  Thank you for taking time to visit with me today. Please don't hesitate to contact me if I can be of assistance to you before our next scheduled telephone appointment.  Following are the goals we discussed today:    Our next appointment is by telephone on 8/21 at 1130  Please call the care guide team at [phone] if you need to cancel or reschedule your appointment.   If you are experiencing a Mental Health or [place] or need someone to talk to, please call the Suicide and Crisis Lifeline: 988 call the [place] National Suicide Prevention Lifeline: (281)821-5165 or TTY: (434) 391-9854 TTY 562-506-7073) to talk to a trained counselor call 1-800-273-TALK (toll free, 24 hour hotline) call 911   The patient verbalized understanding of instructions, educational materials, and care plan provided today and DECLINED offer to receive copy of patient instructions, educational materials, and care plan.   [name] RN, MSN RN Care Management Coordinator [place] [phone] [email or web address]

## 2022-01-09 ENCOUNTER — Telehealth: Payer: Self-pay | Admitting: Podiatry

## 2022-01-09 NOTE — Telephone Encounter (Signed)
Lmom to call and schedule appt to pick up diabetic shoes

## 2022-01-10 ENCOUNTER — Other Ambulatory Visit: Payer: Self-pay

## 2022-01-10 ENCOUNTER — Telehealth: Payer: Self-pay | Admitting: *Deleted

## 2022-01-10 DIAGNOSIS — I10 Essential (primary) hypertension: Secondary | ICD-10-CM

## 2022-01-10 NOTE — Telephone Encounter (Signed)
   Telephone encounter was:  Successful.  01/10/2022 Name: Santanna Whitford MRN: [id number] DOB: Jan 23, 1941  Al Gagen is a 81 y.o. year old female who is a primary care patient of McLean-Scocuzza, Nino Glow, MD . The community resource team was consulted for assistance with Transportation Needs   Care guide performed the following interventions: Patient provided with information about care guide support team and interviewed to confirm resource needs.  Follow Up Plan:  Care guide will follow up with patient by phone over the next day  [place], Care Management  [phone] [address] , [place] [address] Email : [email or web address]

## 2022-01-11 ENCOUNTER — Telehealth: Payer: Self-pay | Admitting: *Deleted

## 2022-01-11 NOTE — Telephone Encounter (Signed)
   Telephone encounter was:  Successful.  01/11/2022 Name: Alexis Farmer MRN: [id number] DOB: 1940/08/29  Alexis Farmer is a 81 y.o. year old female who is a primary care patient of McLean-Scocuzza, Nino Glow, MD . The community resource team was consulted for assistance with Transportation Needs   Care guide performed the following interventions: Patient provided with information about care guide support team and interviewed to confirm resource needs. Ride Date: 01/15/2022 Arlyss Gandy Time: 09:45 AM (EDT) Pickup Address: [address] [place], [place] [address] Also mailed patient [facility] access transportation   Follow Up Plan:  No further follow up planned at this time. The patient has been provided with needed resources.  [place], Care Management  [phone] [address] , [place] [address] Email : [email or web address] [email or web address]

## 2022-01-15 ENCOUNTER — Ambulatory Visit (INDEPENDENT_AMBULATORY_CARE_PROVIDER_SITE_OTHER): Payer: Medicare Other

## 2022-01-15 DIAGNOSIS — I152 Hypertension secondary to endocrine disorders: Secondary | ICD-10-CM | POA: Diagnosis not present

## 2022-01-15 DIAGNOSIS — E1129 Type 2 diabetes mellitus with other diabetic kidney complication: Secondary | ICD-10-CM

## 2022-01-15 DIAGNOSIS — E1159 Type 2 diabetes mellitus with other circulatory complications: Secondary | ICD-10-CM

## 2022-01-15 DIAGNOSIS — M2141 Flat foot [pes planus] (acquired), right foot: Secondary | ICD-10-CM

## 2022-01-15 DIAGNOSIS — E1142 Type 2 diabetes mellitus with diabetic polyneuropathy: Secondary | ICD-10-CM

## 2022-01-15 DIAGNOSIS — M2142 Flat foot [pes planus] (acquired), left foot: Secondary | ICD-10-CM

## 2022-01-15 DIAGNOSIS — I739 Peripheral vascular disease, unspecified: Secondary | ICD-10-CM

## 2022-01-15 NOTE — Progress Notes (Signed)
Patient presents today to pick up diabetic shoes and insoles.  Patient was dispensed 1 pair of diabetic shoes and 3 pairs of foam casted diabetic insoles. Fit was satisfactory. Instructions for break-in and wear was reviewed and a copy was given to the patient.   Re-appointment for regularly scheduled diabetic foot care visits or if they should experience any trouble with the shoes or insoles.   Patient stated that she is not going to wear her diabetic inserts with her shoes. Explained to her that the inserts were needed but she said she isn't going to. I explained the purpose of the inserts and she said she just needed shoes that fit her feet while they were swollen.

## 2022-01-16 ENCOUNTER — Ambulatory Visit: Payer: Medicare Other | Admitting: Oncology

## 2022-01-17 ENCOUNTER — Other Ambulatory Visit: Payer: Self-pay | Admitting: Internal Medicine

## 2022-01-17 DIAGNOSIS — F410 Panic disorder [episodic paroxysmal anxiety] without agoraphobia: Secondary | ICD-10-CM

## 2022-01-17 DIAGNOSIS — F419 Anxiety disorder, unspecified: Secondary | ICD-10-CM

## 2022-01-17 NOTE — Telephone Encounter (Signed)
Pt called stating the pharmacy stated that they do not have the medication-Lorazepam anymore so pt want to know what other option can she use

## 2022-01-19 ENCOUNTER — Other Ambulatory Visit: Payer: Self-pay | Admitting: Internal Medicine

## 2022-01-19 DIAGNOSIS — F419 Anxiety disorder, unspecified: Secondary | ICD-10-CM

## 2022-01-19 DIAGNOSIS — F410 Panic disorder [episodic paroxysmal anxiety] without agoraphobia: Secondary | ICD-10-CM

## 2022-01-19 MED ORDER — LORAZEPAM 1 MG PO TABS: 0.5000 mg | ORAL_TABLET | Freq: Every day | ORAL | 5 refills | Status: DC | PRN

## 2022-02-05 ENCOUNTER — Ambulatory Visit: Payer: Self-pay

## 2022-02-05 NOTE — Patient Instructions (Signed)
Visit Information  Thank you for taking time to visit with me today. Please don't hesitate to contact me if I can be of assistance to you.   Following are the goals we discussed today:   Goals Addressed             This Visit's Progress    Patient Stated:  " Getting my diabetes under control"       Care Coordination Interventions: Provided education to patient about basic DM disease process Reviewed medications with patient and discussed importance of medication adherence Counseled on importance of regular laboratory monitoring as prescribed Discussed plans with patient for ongoing care management follow up and provided patient with direct contact information for care management team Reviewed scheduled/upcoming provider appointments including:  Discussed current Hgb A1C and what is normal and what is patient's goal;  Advised to continue monitoring blood sugars daily and advised by provider Advised to attend provider appointments as recommended.  Allowed patient opportunity to express feelings/ concerns about health conditions.  Reviewed current blood sugar readings and how to treat hypoglycemia to prevent rebound hyperglycemia        Our next appointment is by telephone on 03/06/22 at 11:00 am  Please call the care guide team at [phone] if you need to cancel or reschedule your appointment.   If you are experiencing a Mental Health or [place] or need someone to talk to, please call the Suicide and Crisis Lifeline: 988 call 1-800-273-TALK (toll free, 24 hour hotline)  The patient verbalized understanding of instructions, educational materials, and care plan provided today and DECLINED offer to receive copy of patient instructions, educational materials, and care plan.   The care management team will reach out to the patient again over the next 45 days.

## 2022-02-05 NOTE — Patient Outreach (Signed)
  Care Coordination   Follow Up Visit Note   02/05/2022 Name: Alexis Farmer MRN: [id number] DOB: September 09, 1940  Alexis Farmer is a 81 y.o. year old female who sees McLean-Scocuzza, Nino Glow, MD for primary care. I spoke with  Alexis Farmer by phone today  What matters to the patients health and wellness today?  Managing diabetes and getting Hgb A1c back down.     Goals Addressed             This Visit's Progress    Patient Stated:  " Getting my diabetes under control"       Care Coordination Interventions: Provided education to patient about basic DM disease process Reviewed medications with patient and discussed importance of medication adherence Counseled on importance of regular laboratory monitoring as prescribed Discussed plans with patient for ongoing care management follow up and provided patient with direct contact information for care management team Reviewed scheduled/upcoming provider appointments including:  Discussed current Hgb A1C and what is normal and what is patient's goal;  Advised to continue monitoring blood sugars daily and advised by provider Advised to attend provider appointments as recommended.  Allowed patient opportunity to express feelings/ concerns about health conditions.  Reviewed current blood sugar readings and how to treat hypoglycemia to prevent rebound hyperglycemia        SDOH assessments and interventions completed:  No     Care Coordination Interventions Activated:  Yes  Care Coordination Interventions:  Yes, provided   Follow up plan: Follow up call scheduled for 03/06/22 at 11:00 am    Encounter Outcome:  Pt. Scheduled   Quinn Plowman RN,BSN,CCM RN Care Manager Coordinator [phone]

## 2022-02-13 ENCOUNTER — Other Ambulatory Visit: Payer: Self-pay | Admitting: Internal Medicine

## 2022-02-13 DIAGNOSIS — L304 Erythema intertrigo: Secondary | ICD-10-CM

## 2022-02-28 ENCOUNTER — Telehealth: Payer: Self-pay

## 2022-02-28 ENCOUNTER — Telehealth: Payer: Self-pay | Admitting: *Deleted

## 2022-02-28 DIAGNOSIS — I5032 Chronic diastolic (congestive) heart failure: Secondary | ICD-10-CM

## 2022-02-28 DIAGNOSIS — I10 Essential (primary) hypertension: Secondary | ICD-10-CM

## 2022-02-28 NOTE — Telephone Encounter (Signed)
   Telephone encounter was:  Successful.  02/28/2022 Name: Alexis Farmer MRN: [id number] DOB: [birth year]  Alexis Farmer is a 81 y.o. year old female who is a primary care patient of McLean-Scocuzza, Nino Glow, MD . The community resource team was consulted for assistance with Transportation Needs   Care guide performed the following interventions: Patient provided with information about care guide support team and interviewed to confirm resource needs Follow up call placed to community resources to determine status of patients referral.  Follow Up Plan:  Care guide will follow up with patient by phone over the next day  [place] [address] , [place] [address] Email : Ashby [email or web address]

## 2022-02-28 NOTE — Telephone Encounter (Signed)
   Telephone encounter was:  Unsuccessful.  02/28/2022 Name: Alexis Farmer MRN: [id number] DOB: 07/01/1940  Unsuccessful outbound call made today to assist with:  Transportation Needs   Outreach Attempt:  1st Attempt  A HIPAA compliant voice message was left requesting a return call.  Instructed patient to call back at [phone]. [place] [phone] [address] , [place] [address] Email : [email or web address] [email or web address]

## 2022-03-03 ENCOUNTER — Other Ambulatory Visit: Payer: Self-pay | Admitting: Cardiovascular Disease

## 2022-03-06 ENCOUNTER — Telehealth: Payer: Self-pay

## 2022-03-06 ENCOUNTER — Ambulatory Visit: Payer: Self-pay

## 2022-03-06 NOTE — Patient Outreach (Signed)
  Care Coordination   Follow Up Visit Note   03/06/2022 Name: Alexis Farmer MRN: [id number] DOB: [birth year]  Alexis Farmer is a 81 y.o. year old female who sees McLean-Scocuzza, Nino Glow, MD for primary care. I spoke with  Alexis Farmer by phone today.  What matters to the patients health and wellness today?  Patient states she is going through some things and did not want to talk about it.  Patient states she has not been managing her diabetes well due to other health concerns. Patient stated, "what is the point."  She states she knows that she got off track with managing her diabetes and now is trying to get back on track. She states she takes her diabetes as well as all other medications as prescribed. She reports today's fasting blood sugar was 241.  Patient states her blood sugars having ranged from 200's to 500.  Patient states she has a follow up visit with her primary care provider on 03/21/22 and follow up with oncologist on 04/10/22.     Goals Addressed             This Visit's Progress    Patient Stated:  " Getting my diabetes under control"       Care Coordination Interventions: Reviewed medications with patient and discussed importance of medication adherence Discussed plans with patient for ongoing care management follow up and provided patient with direct contact information for care management team Reviewed scheduled/upcoming provider appointments including:  Advised to continue monitoring blood sugars daily and advised by provider Advised to attend provider appointments as recommended.  Allowed patient opportunity to express feelings/ concerns about health conditions.  Reviewed current blood sugar readings  Advised patient to notify provider of elevated blood sugars Offered referral to Education officer, museum for coping mechanisms/ strategies/ assistance        SDOH assessments and interventions completed:  No     Care Coordination Interventions Activated:  Yes   Care Coordination Interventions:  Yes, provided   Follow up plan: Follow up call scheduled for 03/30/22 at 11 am    Encounter Outcome:  Pt. Visit Completed   Alexis Plowman RN,BSN,CCM [place] [phone] direct line

## 2022-03-06 NOTE — Patient Outreach (Signed)
  Care Coordination   03/06/2022 Name: Eriyanna Kofoed MRN: [id number] DOB: 1941-05-20   Care Coordination Outreach Attempts:   An unsuccessful telephone outreach was attempted for a scheduled appointment today. Follow Up Plan:  Additional outreach attempts will be made to offer the patient care coordination information and services.   Encounter Outcome:  No Answer  Care Coordination Interventions Activated:  No   Care Coordination Interventions:  No, not indicated    Quinn Plowman RN,BSN,CCM [place] [phone] direct line

## 2022-03-21 ENCOUNTER — Telehealth: Payer: Self-pay | Admitting: Internal Medicine

## 2022-03-21 ENCOUNTER — Encounter: Payer: Self-pay | Admitting: Internal Medicine

## 2022-03-21 ENCOUNTER — Ambulatory Visit (INDEPENDENT_AMBULATORY_CARE_PROVIDER_SITE_OTHER): Payer: Medicare Other | Admitting: Internal Medicine

## 2022-03-21 VITALS — BP 154/62 | HR 82 | Temp 98.3°F | Ht 63.0 in | Wt 243.8 lb

## 2022-03-21 DIAGNOSIS — E1159 Type 2 diabetes mellitus with other circulatory complications: Secondary | ICD-10-CM

## 2022-03-21 DIAGNOSIS — F419 Anxiety disorder, unspecified: Secondary | ICD-10-CM

## 2022-03-21 DIAGNOSIS — E2839 Other primary ovarian failure: Secondary | ICD-10-CM | POA: Diagnosis not present

## 2022-03-21 DIAGNOSIS — I152 Hypertension secondary to endocrine disorders: Secondary | ICD-10-CM

## 2022-03-21 DIAGNOSIS — K5900 Constipation, unspecified: Secondary | ICD-10-CM

## 2022-03-21 DIAGNOSIS — Z1231 Encounter for screening mammogram for malignant neoplasm of breast: Secondary | ICD-10-CM

## 2022-03-21 DIAGNOSIS — Z23 Encounter for immunization: Secondary | ICD-10-CM

## 2022-03-21 DIAGNOSIS — L304 Erythema intertrigo: Secondary | ICD-10-CM

## 2022-03-21 DIAGNOSIS — F410 Panic disorder [episodic paroxysmal anxiety] without agoraphobia: Secondary | ICD-10-CM

## 2022-03-21 DIAGNOSIS — E785 Hyperlipidemia, unspecified: Secondary | ICD-10-CM

## 2022-03-21 DIAGNOSIS — N1832 Chronic kidney disease, stage 3b: Secondary | ICD-10-CM | POA: Diagnosis not present

## 2022-03-21 MED ORDER — ROSUVASTATIN CALCIUM 20 MG PO TABS: ORAL_TABLET | ORAL | 3 refills | Status: DC

## 2022-03-21 MED ORDER — LORAZEPAM 1 MG PO TABS: 0.5000 mg | ORAL_TABLET | Freq: Every day | ORAL | 5 refills | Status: DC | PRN

## 2022-03-21 MED ORDER — NYSTATIN 100000 UNIT/GM EX POWD: Freq: Two times a day (BID) | CUTANEOUS | 11 refills | Status: DC

## 2022-03-21 MED ORDER — POLYETHYLENE GLYCOL 3350 17 GM/SCOOP PO POWD: 17.0000 g | Freq: Every day | ORAL | 11 refills | Status: DC | PRN

## 2022-03-21 NOTE — Patient Instructions (Addendum)
Take R 4 units with every meal with carb in it plus sliding scale:  If 200-250 add 1 units. If 251-300 add 3 units If 301-350 add 5 units. If 351-400 add 7 units. If over 400 add 10 units.   If she is not eating and her sugar is high, I told her to just take the sliding scale. I will also cut her N to 20 units in the morning.    Bystolic you should be on 10 mg daily not 2.5 mg daily

## 2022-03-21 NOTE — Progress Notes (Signed)
Chief Complaint  Patient presents with   Follow-up    3 month f/u   F/u  1. Htn elevated bp at home sbp 130s-140s on clonidine 0.2 bid, hydralazine 283 qid, bystolic 10 mg qd spironolactone 25 qd, demadex 10 mg qd and crestor 20 mg qhs, diovant 160 mg qd  2. Flu shot given today     Review of Systems  Constitutional:  Negative for weight loss.  HENT:  Negative for hearing loss.   Eyes:  Negative for blurred vision.  Respiratory:  Negative for shortness of breath.   Cardiovascular:  Negative for chest pain.  Gastrointestinal:  Negative for abdominal pain and blood in stool.  Genitourinary:  Negative for dysuria.  Musculoskeletal:  Negative for back pain, falls and joint pain.  Skin:  Negative for rash.  Neurological:  Negative for headaches.  Psychiatric/Behavioral:  Negative for depression.    Past Medical History:  Diagnosis Date   Arthritis    Chickenpox    Diabetes mellitus without complication ([place])    one elevated reading/ no treatment   Diverticulitis    GI bleed    High cholesterol    History of blood transfusion    Hypertension    Renal insufficiency    Past Surgical History:  Procedure Laterality Date   APPENDECTOMY     CHOLECYSTECTOMY     ECTOPIC PREGNANCY SURGERY     EYE SURGERY     bilateral cataracts   EYE SURGERY     02/11/2019 repair hole in right eye    gallbladder      HYSTEROSCOPY WITH D & C N/A 10/26/2016   Procedure: DILATATION AND CURETTAGE /HYSTEROSCOPY;  Surgeon: Benjaman Kindler, MD;  Location: [facility] ORS;  Service: Gynecology;  Laterality: N/A;   HYSTEROSCOPY WITH D & C N/A 07/07/2018   Procedure: DILATATION AND CURETTAGE /HYSTEROSCOPY;  Surgeon: Benjaman Kindler, MD;  Location: [facility] ORS;  Service: Gynecology;  Laterality: N/A;   THYROIDECTOMY, PARTIAL     Family History  Problem Relation Age of Onset   Diabetes Mother    Hypertension Mother    Stroke Mother    Diabetes Other    Healthy Father    Diabetes Sister    Heart disease Sister     Social History   Socioeconomic History   Marital status: Widowed    Spouse name: Not on file   Number of children: 1   Years of education: 90   Highest education level: 12th grade  Occupational History   Occupation: retired    Comment: hx of Pharmacist, hospital, Secretary/administrator, 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  Tobacco Use   Smoking status: Never   Smokeless tobacco: Never  Vaping Use   Vaping Use: Never used  Substance and Sexual Activity   Alcohol use: No   Drug use: No   Sexual activity: Not Currently  Other Topics Concern   Not on file  Social History Narrative   Lives alone    From [place]   Widowed - was married 3 times starting at age 34    hx of seamtress, 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   Social Determinants of Health   Financial Resource Strain: [place]  (09/28/2020)   Overall Financial Resource Strain (CARDIA)    Difficulty of Paying Living Expenses: Not hard at all  Food Insecurity: No Food Insecurity (09/28/2020)   Hunger Vital Sign    Worried About Running Out of Food in the  Last Year: Never true    Ran Out of Food in the Last Year: Never true  Recent Concern: Food Insecurity - Food Insecurity Present (08/01/2020)   Hunger Vital Sign    Worried About Running Out of Food in the Last Year: Sometimes true    Ran Out of Food in the Last Year: Never true  Transportation Needs: No Transportation Needs (02/28/2022)   PRAPARE - Hydrologist (Medical): No    Lack of Transportation (Non-Medical): No  Physical Activity: Inactive (05/05/2020)   Exercise Vital Sign    Days of Exercise per Week: 0 days    Minutes of Exercise per Session: 0 min  Stress: Stress Concern Present (08/23/2020)   [place]    Feeling of Stress : To some extent  Social Connections: Socially Isolated (08/23/2020)   Social Connection  and Isolation Panel [NHANES]    Frequency of Communication with Friends and Family: More than three times a week    Frequency of Social Gatherings with Friends and Family: More than three times a week    Attends Religious Services: Never    Marine scientist or Organizations: No    Attends Archivist Meetings: Never    Marital Status: Widowed  Intimate Partner Violence: Unknown (09/28/2020)   Humiliation, Afraid, Rape, and Kick questionnaire    Fear of Current or Ex-Partner: No    Emotionally Abused: No    Physically Abused: Not on file    Sexually Abused: No   Current Meds  Medication Sig   ACCU-CHEK GUIDE test strip USE AS INSTRUCTED 3 TIMES DAILY   acetaminophen (TYLENOL) 500 MG tablet Take 500 mg by mouth every 6 (six) hours as needed for mild pain or headache.   Ascorbic Acid (VITAMIN C PO) Take 1 tablet by mouth daily.   aspirin EC 81 MG tablet Take 81 mg by mouth 2 (two) times a week. Swallow whole.   BD INSULIN SYRINGE U/F 30G X 1/2" 0.5 ML MISC USE UP TO 5X PER DAY WITH INSULIN 2X (NPH) AND 3X (REGULAR)   blood glucose meter kit and supplies KIT Accu chek, Dx code E11.65, check 3 times daily   Blood Pressure KIT 1 Device by Does not apply route daily.   cloNIDine (CATAPRES) 0.2 MG tablet Take 1 tablet (0.2 mg total) by mouth 2 (two) times daily.   hydrALAZINE (APRESOLINE) 100 MG tablet Take 1 tablet (100 mg total) by mouth in the morning, at noon, in the evening, and at bedtime.   insulin NPH Human (NOVOLIN N) 100 UNIT/ML injection INJECT 20 UNITS IN THE MORNING AND THE EVENING WITH A MEAL   insulin regular (NOVOLIN R) 100 units/mL injection Inject 0-0.08 mLs (0-8 Units total) into the skin 3 (three) times daily before meals.   LORazepam (ATIVAN) 1 MG tablet Take 0.5 tablets (0.5 mg total) by mouth daily as needed for anxiety. Inform pt 0.5 dose on backorder has to cut this in 1/2 dose   Multiple Vitamins-Minerals (CENTRUM SILVER 50+WOMEN) TABS Take 1 tablet by  mouth daily with breakfast.   nebivolol (BYSTOLIC) 10 MG tablet Take 1 tablet (10 mg total) by mouth daily.   nystatin (MYCOSTATIN/NYSTOP) powder APPLY 1 APPLICATION. TOPICALLY 2 (TWO) TIMES DAILY. UNDER ABDOMEN   polyethylene glycol powder (GLYCOLAX/MIRALAX) 17 GM/SCOOP powder Take 17 g by mouth daily as needed for moderate constipation or severe constipation. Can take up to 2x per day  prn. Mix with 8 ounces of liquid   PYRIDOXINE HCL PO Take 1 tablet by mouth daily.   rosuvastatin (CRESTOR) 20 MG tablet TAKE 1 TABLET BY MOUTH EVERY DAY   spironolactone (ALDACTONE) 25 MG tablet Take 1 tablet (25 mg total) by mouth daily.   torsemide (DEMADEX) 10 MG tablet Take 1 tablet (10 mg total) by mouth daily.   valsartan (DIOVAN) 160 MG tablet TAKE 1 TABLET BY MOUTH EVERY DAY   Allergies  Allergen Reactions   Celexa [Citalopram]     Diarrhea upset stomach    Jardiance [Empagliflozin] Other (See Comments)    Reaction not recalled   Norvasc [Amlodipine]     Leg edema   Tape Other (See Comments)    Leaves the skin "raw" if left on for a period of time- tolerates paper tape   Penicillin V Rash   Penicillins Rash    Has patient had a PCN reaction causing immediate rash, facial/tongue/throat swelling, SOB or lightheadedness with hypotension: Yes Has patient had a PCN reaction causing severe rash involving mucus membranes or skin necrosis: No Has patient had a PCN reaction that required hospitalization No Has patient had a PCN reaction occurring within the last 10 years: Yes If all of the above answers are "NO", then may proceed with Cephalosporin use.    Recent Results (from the past 2160 hour(s))  Comprehensive metabolic panel     Status: Abnormal   Collection Time: 12/26/21 12:08 PM  Result Value Ref Range   Sodium 134 (L) 135 - 145 mmol/L   Potassium 4.1 3.5 - 5.1 mmol/L   Chloride 101 98 - 111 mmol/L   CO2 27 22 - 32 mmol/L   Glucose, Bld 223 (H) 70 - 99 mg/dL    Comment: Glucose reference  range applies only to samples taken after fasting for at least 8 hours.   BUN 22 8 - 23 mg/dL   Creatinine, Ser 1.22 (H) 0.44 - 1.00 mg/dL   Calcium 10.0 8.9 - 10.3 mg/dL   Total Protein 7.4 6.5 - 8.1 g/dL   Albumin 3.8 3.5 - 5.0 g/dL   AST 26 15 - 41 U/L   ALT 21 0 - 44 U/L   Alkaline Phosphatase 36 (L) 38 - 126 U/L   Total Bilirubin 0.7 0.3 - 1.2 mg/dL   GFR, Estimated 44 (L) >60 mL/min    Comment: (NOTE) Calculated using the CKD-EPI Creatinine Equation (2021)    Anion gap 6 5 - 15    Comment: Performed at [facility], [address] [place], [place] [address]  Hepatitis panel, acute     Status: None   Collection Time: 12/26/21 12:08 PM  Result Value Ref Range   Hepatitis B Surface Ag NON REACTIVE NON REACTIVE   HCV Ab NON REACTIVE NON REACTIVE    Comment: (NOTE) Nonreactive HCV antibody screen is consistent with no HCV infections,  unless recent infection is suspected or other evidence exists to indicate HCV infection.     Hep A IgM NON REACTIVE NON REACTIVE   Hep B C IgM NON REACTIVE NON REACTIVE    Comment: Performed at [facility], [place] [address] [place], [place] [address]  Lactate dehydrogenase     Status: None   Collection Time: 12/26/21 12:08 PM  Result Value Ref Range   LDH 160 98 - 192 U/L    Comment: Performed at [facility], [address] [name], [place] [address]  ANA, IFA (with reflex)  Status: None   Collection Time: 12/26/21 12:08 PM  Result Value Ref Range   ANA Ab, IFA Negative     Comment: (NOTE)                                     Negative   <1:80                                     Borderline  1:80                                     Positive   >1:80 ICAP nomenclature: AC-0 For more information about Hep-2 cell patterns use ANApatterns.org, the official website for the International Consensus on Antinuclear Antibody (ANA) Patterns (ICAP). 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 The differential diagnosis includes atypical chronic lymphocytic leukemia/small lymphocytic lymphoma, mantle cell lymphoma, among other mature B-cell lymphoma with occasional CD5 co-expression, including marginal zone B-cell lymphoma and lymphoplasmacytic lymphoma. If the patient is known to have a B-cell leukemia or lymphoma, the finding represents involvement by the lymphoma.  If the patient is not known to have a B-cell leukemia or lymphoma, further investigation is recommended if clinically indicated. In the absence of B-cell lymphoma, small (<5000/uL) clonal B-cell populations in the blood are classified as monoclonal B-cell lymphocytosis.  Clinical correlation is required.  CLL FISH study (including CCND1/IgH) is also recommended to exclude mantle cell lymphoma as clinically indicated.    CLINICAL INFO Comment     Comment: (NOTE) Accompanying CBC dated 12/26/2021 shows: WBC count 6.9, Neu 1.6, Lym 4.5, Mon 0.5    Specimen Type Comment     Comment: Peripheral blood   ASSESSMENT OF LEUKOCYTES Comment     Comment: (NOTE) A partial CD5+ monoclonal B cell population is detected with kappa light chain restriction, non-specific phenotype, representing 17% of leukocytes. They are positive for CD19, CD20, CD22 and FMC-7 and negative for CD10 and CD103. They are partially positive for CD23.  CD38 is positive on 75% of clonal B-cells. Expression of CD38 on >30% of the clonal B-cells is reported to be an  unfavorable prognostic factor in CLL. There is no loss of, or aberrant expression of, the pan T cell antigens to suggest a neoplastic T cell process. CD4:CD8 ratio 1.9 CD57 positive cells are increased but are composed of a mixture of CD4 positive cells, CD8 positive cells and NK-cells, suggestive of a reactive process.  CD57 is a marker of large granular lymphocytes. Clinical correlation is recommended. No circulating blasts are detected. There is no immunophenotypic  evidence of abnormal myeloid maturation. Analysis of the leukocyte population shows: granulocytes 25%, monocytes 6 %, lymphocytes 69%, blasts <0.1%, B cells 19%, T cells 42%, NK cells 8%, CD57+ LGL's 16%.    %  Viable Cells Comment     Comment: 96%   Immunophenotypic Profile 17% of total cells (Phenotype below)     Comment: Comment Abnormal cell population: present    ANALYSIS AND GATING STRATEGY Comment     Comment: (NOTE) 8 color analysis with CD45/SSC gating  Technical-Analysis performed at [employer], [place], [place], [place] [address] GBEE#[id number] Director: Katina Degree, MDPhD [facility] Performed at [facility], [address] [place], [place] [address]    IMMUNOPHENOTYPING STUDY Comment     Comment: (NOTE) CD2       (-)            CD3       (-) CD4       (-)            CD5       See Text CD7       (-)            CD8       (-) CD10      (-)            CD11b     (-) CD11c     (+)            CD13      (-) CD14      (-)            CD15      (-) CD16      (-)            CD19      (+) CD20      (+)            CD22      (+) CD23      See Text       CD33      (-) CD34      (-)            CD38      See Text CD45      (+)            CD56      (-) CD57      (-)            CD103     (-) CD117     (-)            FMC-7     (+) HLA-DR    (+)            KAPPA     (+) LAMBDA    (-)            CD64      (-)    PATHOLOGIST NAME Comment     Comment: Katheran James, M.D. Ph.D    COMMENT: Comment     Comment: (NOTE) Each antibody in this assay was utilized to assess for potential abnormalities of studied cell populations or to characterize identified abnormalities. This test was developed and its performance characteristics determined by [facility].  It has not been cleared or approved by the U.S. Food and Drug Administration. The FDA has determined that such clearance or approval is not necessary. This test is used for clinical purposes.  It should not be regarded as investigational or for research. Performed At: [facility] [address] [place], [place] [id number] Katina Degree MDPhD XE:[id number] Performed At: [facility] [address] [place], [place] [id number] Katina Degree MDPhD [id number]  Performed At: [facility] [place], [place] [id number] Katina Degree MD ZM:[id number]   Kappa/lambda light chains     Status: Abnormal   Collection Time: 12/26/21 12:08 PM  Result Value Ref Range   Kappa free light chain 62.7 (H) 3.3 - 19.4 mg/L   Lambda free light chains 23.1 5.7 - 26.3 mg/L   Kappa, lambda light chain ratio 2.71 (H) 0.26 - 1.65    Comment: (NOTE) Performed At: [facility] [place], [place] [id number] Rush Farmer MD EX:[id number]   CBC with Differential/Platelet     Status: Abnormal   Collection Time: 12/26/21 12:08 PM  Result Value Ref Range   WBC 6.9 4.0 - 10.5 K/uL   RBC 4.28 3.87 - 5.11 MIL/uL   Hemoglobin 12.5 12.0 - 15.0 g/dL   HCT 37.9 36.0 - 46.0 %   MCV 88.6 80.0 - 100.0 fL   MCH 29.2 26.0 - 34.0 pg   MCHC 33.0 30.0 - 36.0 g/dL   RDW 13.9 11.5 - 15.5 %   Platelets 229 150 - 400 K/uL   nRBC 0.0 0.0 - 0.2 %   Neutrophils Relative % 23 %   Neutro Abs 1.6 (L) 1.7 - 7.7 K/uL   Lymphocytes Relative 66 %   Lymphs Abs 4.5 (H) 0.7 - 4.0 K/uL   Monocytes Relative 7 %   Monocytes Absolute 0.5 0.1 - 1.0 K/uL   Eosinophils Relative 3 %   Eosinophils Absolute 0.2 0.0 - 0.5 K/uL    Basophils Relative 1 %   Basophils Absolute 0.1 0.0 - 0.1 K/uL   Immature Granulocytes 0 %   Abs Immature Granulocytes 0.01 0.00 - 0.07 K/uL    Comment: Performed at [facility], [address] [place], [place] [address]  Technologist smear review     Status: None   Collection Time: 12/26/21 12:09 PM  Result Value Ref Range   WBC MORPHOLOGY Normal RBC, WBC, and platelet    RBC MORPHOLOGY Normal RBC, WBC, and platelet    Plt Morphology Normal RBC, WBC, and platelet     Comment: Performed at [facility], [address] [place], [place] [address]   Objective  Body mass index is 43.19 kg/m. Wt Readings from Last 3 Encounters:  03/21/22 243 lb 12.8 oz (110.6 kg)  01/04/22 240 lb 1.6 oz (108.9 kg)  12/26/21 240 lb (108.9 kg)   Temp Readings from Last 3 Encounters:  03/21/22 98.3 F (36.8 C) (Oral)  01/04/22 97.8 F (36.6 C)  12/26/21 (!) 97.3 F (36.3 C) (Tympanic)   BP Readings from Last 3 Encounters:  03/21/22 (!) 154/62  01/04/22 (!) 177/69  12/26/21 (!) 196/56   Pulse Readings from Last 3 Encounters:  03/21/22 82  01/04/22 60  12/26/21 61    Physical Exam Vitals and nursing note reviewed.  Constitutional:      Appearance: Normal appearance. She is well-developed and well-groomed.  HENT:     Head: Normocephalic and atraumatic.  Eyes:     Conjunctiva/sclera: Conjunctivae normal.     Pupils: Pupils are equal, round, and reactive to light.  Cardiovascular:     Rate and Rhythm: Normal rate and regular rhythm.     Heart sounds: Normal heart sounds. No murmur heard. Pulmonary:     Effort: Pulmonary effort is normal.     Breath sounds: Normal breath sounds.  Abdominal:     General: Abdomen is flat. Bowel sounds are normal.     Tenderness:  There is no abdominal tenderness.  Musculoskeletal:        General: No tenderness.  Skin:    General: Skin is warm and dry.  Neurological:     General: No focal deficit present.     Mental Status: She is alert  and oriented to person, place, and time. Mental status is at baseline.     Cranial Nerves: Cranial nerves 2-12 are intact.     Motor: Motor function is intact.     Coordination: Coordination is intact.     Gait: Gait is intact.     Comments: Walking with walker  Psychiatric:        Attention and Perception: Attention and perception normal.        Mood and Affect: Mood and affect normal.        Speech: Speech normal.        Behavior: Behavior normal. Behavior is cooperative.        Thought Content: Thought content normal.        Cognition and Memory: Cognition and memory normal.        Judgment: Judgment normal.     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 Her A1c in 6/23 was 9.6. who rec insulin R 4 units with every meal with carb in it plus sliding scale: If 200-250 add 1 units. If 251-300 add 3 units If 301-350 add 5 units. If 351-400 add 7 units. If over 400 add 10 units. If she is not eating and her sugar is high, I told her to just take the sliding scale. I will also cut her N to 20 units in the morning. I encouraged lifestyle modifications.   HM Flu shot given today high dose  4/4 covid vxs had  Check on other vaccines (I.e Tdap, prevnar, pna 23, shingrix)  -as of 08/25/20 declines prevnar    mammo had 09/02/21 [facility] neg ordered 08/2022 with dexa   Pap had 05/28/18 negative [facility] 07/07/18 endometrial bx neg and same 10/26/16  Of and off bleeding f/u ob/gyn and given progestin 08/11/19 did not pick up  appt 04/2020 gyn to f/u   DEXA ordered for 08/2022 -no extra Ca h/o hyperCa    Colonoscopy  Dr. Allen Norris in  [place]  -09/10/14 diverticulosis sessile inflammatory polyp=inflammatory and EGD chronic active gastritis   -f/u prn does not want to do further unless needed    CT chest 12/15/20  IMPRESSION: 1. Negative for aortic dissection or aneurysm.   2. 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 Coronary artery and Aortic atherosclerosis (ICD10-I70.0).   3. No superimposed acute or inflammatory process identified in the chest, abdomen, or pelvis. Diverticulosis of the large bowel and duodenum without active inflammation. 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   CXR 01/20/18 pulm vascular congestion otherwise neg  cxr 06/22/19 with scarring    02/03/18 thyroid bx negative   Of note CT renal 07/16/16 c/w bulging disc and  L4/5 spinal stenosis    [facility] saw 03/29/19 closed fx lateral malleolus right fibular broken ankle Carlynn Spry [facility] urgent care    Specialists Renal Dr. Wallace Keller Cards Dr. Randell Patient endocrine Vascular surgery in [facility] ob[facility]gyn      Provider: Dr. Olivia Mackie McLean-Scocuzza-Internal Medicine

## 2022-03-21 NOTE — Telephone Encounter (Signed)
Can you call the pharmacy the ativan 0.5 dose was the problem so I changed to 1 mg for her to cut the dose in 1/2  The only other option would be for her to take daily celexa for anxiety/depression does she want to try this? 20 mg daily?

## 2022-03-21 NOTE — Telephone Encounter (Signed)
Patient called and said her pharmacy can not refill her anxiety  medication, it is not longer made. Patient did not know the name. She is requesting something else for her anxiety.

## 2022-03-21 NOTE — Addendum Note (Signed)
Addended by: Orland Mustard on: 03/21/2022 01:07 PM   Modules accepted: Orders

## 2022-03-22 NOTE — Telephone Encounter (Signed)
Spoke with pharmacy and it was ready for her pick up the ativan. Pt does not want celexa will go and pick up the ativan and cut pill in half

## 2022-03-27 ENCOUNTER — Ambulatory Visit: Payer: Medicare Other | Admitting: Internal Medicine

## 2022-03-30 ENCOUNTER — Telehealth: Payer: Self-pay

## 2022-03-30 ENCOUNTER — Ambulatory Visit: Payer: Self-pay

## 2022-03-30 NOTE — Patient Outreach (Signed)
  Care Coordination   Follow Up Visit Note   03/30/2022 Name: Alexis Farmer MRN: [id number] DOB: [birth year]  Alexis Farmer is a 81 y.o. year old female who sees McLean-Scocuzza, Nino Glow, MD for primary care. I spoke with  Alexis Farmer by phone today.  What matters to the patients health and wellness today?  Patient reports having follow up visit with endocrinologist on 03/08/22. She reports having adjustments to her diabetic medication.  Patient states her blood sugar are doing better with the adjustments.  She states her blood sugars are ranging from 140 -300's.  She reports having at least 2 blood sugars in the 60's.     Goals Addressed             This Visit's Progress    Patient Stated:  " Getting my diabetes under control"       Care Coordination Interventions: Reviewed medications with patient and discussed importance of medication adherence Reviewed scheduled/upcoming provider appointments Advised to continue monitoring blood sugars daily  and record.  Advised to continue to attend provider appointments as recommended.  Allowed patient opportunity to express feelings/ concerns about health conditions.  Reviewed current blood sugar readings  Advised patient to notify provider of elevated blood sugars Offered referral to Education officer, museum for coping mechanisms/ strategies/ assistance Discussed fall precautions Reviewed hypoglycemic treatment        SDOH assessments and interventions completed:  No     Care Coordination Interventions Activated:  Yes  Care Coordination Interventions:  Yes, provided   Follow up plan: Follow up call scheduled for 05/02/22 at 10 am    Encounter Outcome:  Pt. Visit Completed

## 2022-03-30 NOTE — Patient Outreach (Signed)
  Care Coordination   03/30/2022 Name: Jhana Giarratano MRN: [id number] DOB: [birth year]   Care Coordination Outreach Attempts:  An unsuccessful telephone outreach was attempted for a scheduled appointment today.  Follow Up Plan:  Additional outreach attempts will be made to offer the patient care coordination information and services.   Encounter Outcome:  No Answer  Care Coordination Interventions Activated:  No   Care Coordination Interventions:  No, not indicated    Quinn Plowman RN,BSN,CCM [place] [phone] direct line

## 2022-04-03 ENCOUNTER — Ambulatory Visit (INDEPENDENT_AMBULATORY_CARE_PROVIDER_SITE_OTHER): Payer: Medicare Other | Admitting: Podiatry

## 2022-04-03 DIAGNOSIS — M79674 Pain in right toe(s): Secondary | ICD-10-CM

## 2022-04-03 DIAGNOSIS — B351 Tinea unguium: Secondary | ICD-10-CM

## 2022-04-03 DIAGNOSIS — M79675 Pain in left toe(s): Secondary | ICD-10-CM

## 2022-04-03 DIAGNOSIS — E1142 Type 2 diabetes mellitus with diabetic polyneuropathy: Secondary | ICD-10-CM

## 2022-04-03 NOTE — Progress Notes (Unsigned)
  Subjective:  Patient ID: Alexis Farmer, female    DOB: Nov 27, 1940,  MRN: [id number]  Alexis Farmer presents to clinic today for:  Chief Complaint  Patient presents with   Nail Problem    Diabetic foot care BS-180 A1C-9.? PCP-[facility]-Scocuzza PCP VST-02/2022   New problem(s): None.   PCP is McLean-Scocuzza, Nino Glow, MD , and last visit was  {Time; dates multiple:15870}.  Allergies  Allergen Reactions   Celexa [Citalopram]     Diarrhea upset stomach    Jardiance [Empagliflozin] Other (See Comments)    Reaction not recalled   Norvasc [Amlodipine]     Leg edema   Tape Other (See Comments)    Leaves the skin "raw" if left on for a period of time- tolerates paper tape   Penicillin V Rash   Penicillins Rash    Has patient had a PCN reaction causing immediate rash, facial/tongue/throat swelling, SOB or lightheadedness with hypotension: Yes Has patient had a PCN reaction causing severe rash involving mucus membranes or skin necrosis: No Has patient had a PCN reaction that required hospitalization No Has patient had a PCN reaction occurring within the last 10 years: Yes If all of the above answers are "NO", then may proceed with Cephalosporin use.     Review of Systems: Negative except as noted in the HPI.  Objective: No changes noted in today's physical examination.  Alexis Farmer is a pleasant 81 y.o. female {jgbodyhabitus:24098} AAO x 3.  Neurovascular Examination: CFT <3 seconds b/l LE. Nonpalpable DP pulse(s) b/l LE. Nonpalpable PT pulse(s) b/l LE. Pedal hair absent. No pain with calf compression b/l. Nonpitting edema noted BLE. Evidence of chronic venous insufficiency b/l LE. No cyanosis or clubbing noted b/l LE.  Protective sensation decreased with 10 gram monofilament b/l.  Dermatological:  Pedal integument with normal turgor, texture and tone b/l LE. No open wounds b/l. No interdigital macerations b/l. Toenails 1-4 bilaterally and right 5th toe elongated,  thickened, discolored with subungual debris. +Tenderness with dorsal palpation of nailplates. Anonychia noted L 5th toe. Nailbed(s) epithelialized.   Musculoskeletal:  Muscle strength 5/5 to all lower extremity muscle groups bilaterally. Pes planus deformity noted bilateral LE. Utilizes walker for ambulation assistance.  Assessment/Plan: 1. Pain due to onychomycosis of toenails of both feet   2. Diabetic peripheral neuropathy associated with type 2 diabetes mellitus ([place])     No orders of the defined types were placed in this encounter.   {Jgplan:23602::"-Patient/POA to call should there be question/concern in the interim."}   Return in about 3 months (around 07/04/2022).  [name], DPM

## 2022-04-06 ENCOUNTER — Inpatient Hospital Stay: Payer: Medicare Other | Attending: Oncology

## 2022-04-06 DIAGNOSIS — N1832 Chronic kidney disease, stage 3b: Secondary | ICD-10-CM | POA: Diagnosis not present

## 2022-04-06 DIAGNOSIS — D72820 Lymphocytosis (symptomatic): Secondary | ICD-10-CM | POA: Insufficient documentation

## 2022-04-06 LAB — CBC WITH DIFFERENTIAL/PLATELET
Abs Immature Granulocytes: 0.02 10*3/uL (ref 0.00–0.07)
Basophils Absolute: 0.1 10*3/uL (ref 0.0–0.1)
Basophils Relative: 1 %
Eosinophils Absolute: 0.2 10*3/uL (ref 0.0–0.5)
Eosinophils Relative: 2 %
HCT: 38.5 % (ref 36.0–46.0)
Hemoglobin: 12.4 g/dL (ref 12.0–15.0)
Immature Granulocytes: 0 %
Lymphocytes Relative: 54 %
Lymphs Abs: 3.8 10*3/uL (ref 0.7–4.0)
MCH: 28.8 pg (ref 26.0–34.0)
MCHC: 32.2 g/dL (ref 30.0–36.0)
MCV: 89.5 fL (ref 80.0–100.0)
Monocytes Absolute: 0.6 10*3/uL (ref 0.1–1.0)
Monocytes Relative: 9 %
Neutro Abs: 2.4 10*3/uL (ref 1.7–7.7)
Neutrophils Relative %: 34 %
Platelets: 235 10*3/uL (ref 150–400)
RBC: 4.3 MIL/uL (ref 3.87–5.11)
RDW: 13.1 % (ref 11.5–15.5)
WBC: 7.1 10*3/uL (ref 4.0–10.5)
nRBC: 0 % (ref 0.0–0.2)

## 2022-04-06 LAB — COMPREHENSIVE METABOLIC PANEL
ALT: 21 U/L (ref 0–44)
AST: 26 U/L (ref 15–41)
Albumin: 4.1 g/dL (ref 3.5–5.0)
Alkaline Phosphatase: 37 U/L — ABNORMAL LOW (ref 38–126)
Anion gap: 7 (ref 5–15)
BUN: 28 mg/dL — ABNORMAL HIGH (ref 8–23)
CO2: 27 mmol/L (ref 22–32)
Calcium: 10.2 mg/dL (ref 8.9–10.3)
Chloride: 104 mmol/L (ref 98–111)
Creatinine, Ser: 1.45 mg/dL — ABNORMAL HIGH (ref 0.44–1.00)
GFR, Estimated: 36 mL/min — ABNORMAL LOW (ref >60)
Glucose, Bld: 296 mg/dL — ABNORMAL HIGH (ref 70–99)
Potassium: 4.1 mmol/L (ref 3.5–5.1)
Sodium: 138 mmol/L (ref 135–145)
Total Bilirubin: 0.8 mg/dL (ref 0.3–1.2)
Total Protein: 7.6 g/dL (ref 6.5–8.1)

## 2022-04-06 LAB — LACTATE DEHYDROGENASE: LDH: 153 U/L (ref 98–192)

## 2022-04-07 ENCOUNTER — Encounter: Payer: Self-pay | Admitting: Podiatry

## 2022-04-10 ENCOUNTER — Other Ambulatory Visit: Payer: Self-pay

## 2022-04-10 ENCOUNTER — Inpatient Hospital Stay (HOSPITAL_BASED_OUTPATIENT_CLINIC_OR_DEPARTMENT_OTHER): Payer: Medicare Other | Admitting: Oncology

## 2022-04-10 ENCOUNTER — Encounter: Payer: Self-pay | Admitting: Oncology

## 2022-04-10 VITALS — BP 164/85 | HR 64 | Temp 97.3°F | Resp 18 | Wt 243.3 lb

## 2022-04-10 DIAGNOSIS — D72820 Lymphocytosis (symptomatic): Secondary | ICD-10-CM | POA: Diagnosis not present

## 2022-04-10 DIAGNOSIS — N1832 Chronic kidney disease, stage 3b: Secondary | ICD-10-CM | POA: Diagnosis not present

## 2022-04-10 NOTE — Assessment & Plan Note (Signed)
#  Monoclonal lymphocytosis of unknown significance.  Labs are reviewed and discussed with patient. 12/26/21 peripheral blood flowcytometry showed CD5 positive monoclonal B cell lymphocytes, 17% of leukocytes, <5000/ul, phenotype is non specific.  Patient is asymptomatic, no constitutional symptoms. Recommend observation.

## 2022-04-10 NOTE — Assessment & Plan Note (Addendum)
Creatinine is worse.  Likely secondary to uncontrolled diabetes.   Recommend patient continue follow-up with primary care provider, tighten glycemic control. Encourage oral hydration.  Avoid nephrotoxins.

## 2022-04-10 NOTE — Progress Notes (Addendum)
Hematology/Oncology Progress note [phone] [phone]       ASSESSMENT & PLAN:   Monoclonal B-cell lymphocytosis of undetermined significance #Monoclonal lymphocytosis of unknown significance.  Labs are reviewed and discussed with patient. 12/26/21 peripheral blood flowcytometry showed CD5 positive monoclonal B cell lymphocytes, 17% of leukocytes, <5000/ul, phenotype is non specific.  Patient is asymptomatic, no constitutional symptoms. Recommend observation.   Stage 3b chronic kidney disease (HCC) Creatinine is worse.  Likely secondary to uncontrolled diabetes.   Recommend patient continue follow-up with primary care provider, tighten glycemic control. Encourage oral hydration.  Avoid nephrotoxins.   Orders Placed This Encounter  Procedures   CBC with Differential/Platelet    Standing Status:   Future    Standing Expiration Date:   04/11/2023   Comprehensive metabolic panel    Standing Status:   Future    Standing Expiration Date:   04/10/2023   Lactate dehydrogenase    Standing Status:   Future    Standing Expiration Date:   04/11/2023   Flow cytometry panel-leukemia/lymphoma work-up    Standing Status:   Future    Standing Expiration Date:   04/11/2023   Follow-up in 6 months. All questions were answered. The patient knows to call the clinic with any problems, questions or concerns.  Earlie Server, MD, PhD [facility] Hematology Oncology 04/10/2022   CHIEF COMPLAINTS/REASON FOR VISIT:  Follow up for monoclonal lymphocytosis  HISTORY OF PRESENTING ILLNESS:  Alexis Farmer is a  81 y.o.  female with PMH listed below who was referred to me for evaluation of leukocytosis Reviewed patient' recent labs obtained by PCP.   12/13/2021 CBC showed elevated white count of 6.6, increased lymphocyte percentage 55.7.  Absolute lymphocytes 3.7. Previous lab records reviewed. Leukocytosis onset of intermittent. Patient reports chronic arthritis, bilateral  lower extremity swelling, left worse than right.  She was recently seen by dermatology for a skin lesion on her back.  Patient reports that her previously weighed 260 pounds.  Currently she weighed 240 pounds. Appetite is fair. No fever, night sweating.  Patient was accompanied by her granddaughter.  08/12/21 CT abdomen pelvis did not show any lymphadenopathy. Physical examination did not showed lymphadenopathy.   INTERVAL HISTORY Alexis Farmer is a 81 y.o. female who has above history reviewed by me today presents for follow up visit for monoclonal lymphocytosis. Patient reports no new complaints.  She was accompanied by granddaughter.   Review of Systems  Constitutional:  Positive for fatigue. Negative for appetite change, chills, fever and unexpected weight change.  HENT:   Negative for hearing loss and voice change.   Eyes:  Negative for eye problems.  Respiratory:  Negative for chest tightness and cough.   Cardiovascular:  Negative for chest pain.  Gastrointestinal:  Negative for abdominal distention, abdominal pain and blood in stool.  Endocrine: Negative for hot flashes.  Genitourinary:  Negative for difficulty urinating and frequency.   Musculoskeletal:  Positive for arthralgias.  Skin:  Negative for itching.  Neurological:  Negative for extremity weakness.  Hematological:  Negative for adenopathy.  Psychiatric/Behavioral:  Negative for confusion.      MEDICAL HISTORY:  Past Medical History:  Diagnosis Date   Arthritis    Chickenpox    Diabetes mellitus without complication ([place])    one elevated reading/ no treatment   Diverticulitis    GI bleed    High cholesterol    History of blood transfusion    Hypertension    Renal insufficiency  SURGICAL HISTORY: Past Surgical History:  Procedure Laterality Date   APPENDECTOMY     CHOLECYSTECTOMY     ECTOPIC PREGNANCY SURGERY     EYE SURGERY     bilateral cataracts   EYE SURGERY     02/11/2019 repair hole in  right eye    gallbladder      HYSTEROSCOPY WITH D & C N/A 10/26/2016   Procedure: DILATATION AND CURETTAGE /HYSTEROSCOPY;  Surgeon: Benjaman Kindler, MD;  Location: [facility] ORS;  Service: Gynecology;  Laterality: N/A;   HYSTEROSCOPY WITH D & C N/A 07/07/2018   Procedure: DILATATION AND CURETTAGE /HYSTEROSCOPY;  Surgeon: Benjaman Kindler, MD;  Location: [facility] ORS;  Service: Gynecology;  Laterality: N/A;   THYROIDECTOMY, PARTIAL      SOCIAL HISTORY: Social History   Socioeconomic History   Marital status: Widowed    Spouse name: Not on file   Number of children: 1   Years of education: 68   Highest education level: 12th grade  Occupational History   Occupation: retired    Comment: hx of Pharmacist, hospital, Secretary/administrator, 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  Tobacco Use   Smoking status: Never   Smokeless tobacco: Never  Vaping Use   Vaping Use: Never used  Substance and Sexual Activity   Alcohol use: No   Drug use: No   Sexual activity: Not Currently  Other Topics Concern   Not on file  Social History Narrative   Lives alone    From [place]   Widowed - was married 3 times starting at age 46    hx of seamtress, 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   Social Determinants of Health   Financial Resource Strain: [place]  (09/28/2020)   Overall Financial Resource Strain (CARDIA)    Difficulty of Paying Living Expenses: Not hard at all  Food Insecurity: No Food Insecurity (09/28/2020)   Hunger Vital Sign    Worried About Running Out of Food in the Last Year: Never true    [place] of Food in the Last Year: Never true  Recent Concern: Food Insecurity - Food Insecurity Present (08/01/2020)   Hunger Vital Sign    Worried About Running Out of Food in the Last Year: Sometimes true    Ran Out of Food in the Last Year: Never true  Transportation Needs: No Transportation Needs (02/28/2022)   PRAPARE - Radiographer, therapeutic (Medical): No    Lack of Transportation (Non-Medical): No  Physical Activity: Inactive (05/05/2020)   Exercise Vital Sign    Days of Exercise per Week: 0 days    Minutes of Exercise per Session: 0 min  Stress: Stress Concern Present (08/23/2020)   [place]    Feeling of Stress : To some extent  Social Connections: Socially Isolated (08/23/2020)   Social Connection and Isolation Panel [NHANES]    Frequency of Communication with Friends and Family: More than three times a week    Frequency of Social Gatherings with Friends and Family: More than three times a week    Attends Religious Services: Never    Marine scientist or Organizations: No    Attends Archivist Meetings: Never    Marital Status: Widowed  Intimate Partner Violence: Unknown (09/28/2020)   Humiliation, Afraid, Rape, and Kick questionnaire    Fear of Current or Ex-Partner: No    Emotionally Abused: No  Physically Abused: Not on file    Sexually Abused: No    FAMILY HISTORY: Family History  Problem Relation Age of Onset   Diabetes Mother    Hypertension Mother    Stroke Mother    Diabetes Other    Healthy Father    Diabetes Sister    Heart disease Sister     ALLERGIES:  is allergic to celexa [citalopram], jardiance [empagliflozin], norvasc [amlodipine], tape, penicillin v, and penicillins.  MEDICATIONS:  Current Outpatient Medications  Medication Sig Dispense Refill   acetaminophen (TYLENOL) 500 MG tablet Take 500 mg by mouth every 6 (six) hours as needed for mild pain or headache.     Ascorbic Acid (VITAMIN C PO) Take 1 tablet by mouth daily.     BD INSULIN SYRINGE U/F 30G X 1/2" 0.5 ML MISC USE UP TO 5X PER DAY WITH INSULIN 2X (NPH) AND 3X (REGULAR) 500 each 12   cloNIDine (CATAPRES) 0.2 MG tablet Take 1 tablet (0.2 mg total) by mouth 2 (two) times daily. 270 tablet 3   hydrALAZINE (APRESOLINE) 100 MG tablet Take 1  tablet (100 mg total) by mouth in the morning, at noon, in the evening, and at bedtime. 360 tablet 1   insulin NPH Human (NOVOLIN N) 100 UNIT/ML injection INJECT 20 UNITS IN THE MORNING AND THE EVENING WITH A MEAL 40 mL PRN   insulin regular (NOVOLIN R) 100 units/mL injection Inject 0-0.08 mLs (0-8 Units total) into the skin 3 (three) times daily before meals. 30 mL 11   LORazepam (ATIVAN) 1 MG tablet Take 0.5 tablets (0.5 mg total) by mouth daily as needed for anxiety. Inform pt 0.5 dose on backorder has to cut this in 1/2 dose 15 tablet 5   Multiple Vitamins-Minerals (CENTRUM SILVER 50+WOMEN) TABS Take 1 tablet by mouth daily with breakfast.     nebivolol (BYSTOLIC) 10 MG tablet Take 1 tablet (10 mg total) by mouth daily. 90 tablet 1   nystatin (MYCOSTATIN/NYSTOP) powder Apply topically 2 (two) times daily. Under abdomen 60 g 11   polyethylene glycol powder (GLYCOLAX/MIRALAX) 17 GM/SCOOP powder Take 17 g by mouth daily as needed for moderate constipation or severe constipation. Can take up to 2x per day prn. Mix with 8 ounces of liquid 3350 g 11   PYRIDOXINE HCL PO Take 1 tablet by mouth daily.     rosuvastatin (CRESTOR) 20 MG tablet TAKE 1 TABLET BY MOUTH EVERY DAY 90 tablet 3   spironolactone (ALDACTONE) 25 MG tablet Take 1 tablet (25 mg total) by mouth daily. 90 tablet 3   torsemide (DEMADEX) 10 MG tablet Take 1 tablet (10 mg total) by mouth daily. 90 tablet 3   valsartan (DIOVAN) 160 MG tablet TAKE 1 TABLET BY MOUTH EVERY DAY 90 tablet 0   ACCU-CHEK GUIDE test strip USE AS INSTRUCTED 3 TIMES DAILY 300 strip 12   aspirin EC 81 MG tablet Take 81 mg by mouth 2 (two) times a week. Swallow whole. (Patient not taking: Reported on 04/10/2022)     blood glucose meter kit and supplies KIT Accu chek, Dx code E11.65, check 3 times daily 1 each 0   Blood Pressure KIT 1 Device by Does not apply route daily. 1 kit 0   diazepam (VALIUM) 5 MG tablet 1 PO 1 hour beofre MRI procedure (Patient not taking:  Reported on 04/10/2022)     gabapentin (NEURONTIN) 300 MG capsule TAKE 1 CAPSULE AT BEDTIME FOR 7 DAYS THEN 1 CAP TWICE DAILY (Patient not  taking: Reported on 04/10/2022)     ondansetron (ZOFRAN) 4 MG tablet Take 1 tablet (4 mg total) by mouth every 6 (six) hours as needed for nausea. (Patient not taking: Reported on 12/13/2021) 20 tablet 0   No current facility-administered medications for this visit.     PHYSICAL EXAMINATION: ECOG PERFORMANCE STATUS: 2 - Symptomatic, <50% confined to bed Vitals:   04/10/22 1306  BP: (!) 164/85  Pulse: 64  Resp: 18  Temp: (!) 97.3 F (36.3 C)  SpO2: 99%   Filed Weights   04/10/22 1306  Weight: 243 lb 4.8 oz (110.4 kg)    Physical Exam Constitutional:      General: She is not in acute distress.    Appearance: She is obese.  HENT:     Head: Normocephalic.  Eyes:     General: No scleral icterus. Cardiovascular:     Rate and Rhythm: Normal rate.  Pulmonary:     Effort: Pulmonary effort is normal. No respiratory distress.     Breath sounds: No wheezing.  Abdominal:     General: Bowel sounds are normal. There is no distension.     Palpations: Abdomen is soft.  Musculoskeletal:        General: No deformity. Normal range of motion.     Cervical back: Normal range of motion and neck supple.  Skin:    General: Skin is warm and dry.     Findings: No erythema or rash.  Neurological:     Mental Status: She is alert and oriented to person, place, and time. Mental status is at baseline.     Cranial Nerves: No cranial nerve deficit.     Coordination: Coordination normal.  Psychiatric:        Mood and Affect: Mood normal.     Labs are reviewed by me.   RADIOGRAPHIC STUDIES: I have personally reviewed the radiological images as listed and agreed with the findings in the report. No results found.   LABORATORY DATA:  I have reviewed the data as listed    Latest Ref Rng & Units 04/06/2022    9:25 AM 12/26/2021   12:08 PM 12/13/2021    10:33 AM  CBC  WBC 4.0 - 10.5 K/uL 7.1  6.9  6.6   Hemoglobin 12.0 - 15.0 g/dL 12.4  12.5  12.6   Hematocrit 36.0 - 46.0 % 38.5  37.9  38.5   Platelets 150 - 400 K/uL 235  229  203.0        Latest Ref Rng & Units 04/06/2022    9:25 AM 12/26/2021   12:08 PM 12/13/2021   10:33 AM  CMP  Glucose 70 - 99 mg/dL 296  223    BUN 8 - 23 mg/dL 28  22    Creatinine 0.44 - 1.00 mg/dL 1.45  1.22    Sodium 135 - 145 mmol/L 138  134    Potassium 3.5 - 5.1 mmol/L 4.1  4.1    Chloride 98 - 111 mmol/L 104  101    CO2 22 - 32 mmol/L 27  27    Calcium 8.9 - 10.3 mg/dL 10.2  10.0    Total Protein 6.5 - 8.1 g/dL 7.6  7.4  7.0   Total Bilirubin 0.3 - 1.2 mg/dL 0.8  0.7    Alkaline Phos 38 - 126 U/L 37  36    AST 15 - 41 U/L 26  26    ALT 0 - 44 U/L 21  21  Iron/TIBC/Ferritin/ %Sat    Component Value Date/Time   IRON 78 12/13/2021 1033   TIBC 341.6 12/13/2021 1033   FERRITIN 246.9 12/13/2021 1033   IRONPCTSAT 22.8 12/13/2021 1033

## 2022-04-12 LAB — IFE+PROTEIN ELECTRO, 24-HR UR
% BETA, Urine: 8.4 %
ALPHA 1 URINE: 4.6 %
Albumin, U: 76.8 %
Alpha 2, Urine: 5.3 %
GAMMA GLOBULIN URINE: 4.9 %
Total Protein, Urine-Ur/day: 572 mg/24 hr — ABNORMAL HIGH (ref 30–150)
Total Protein, Urine: 31.8 mg/dL
Total Volume: 1800

## 2022-04-16 ENCOUNTER — Telehealth: Payer: Self-pay

## 2022-04-16 NOTE — Telephone Encounter (Signed)
Called pt, no answer. Detailed VM left with results and MD recommendation. Asked pt to call back if there are any questions.

## 2022-04-16 NOTE — Telephone Encounter (Signed)
-----   Message from Earlie Server, MD sent at 04/14/2022  4:40 PM EDT ----- Please let patient know that her 24 hour urine protein electrophoresis showed no M protein, which is good. It does show that she has regular protein in the urine, which is commonly see in patient with chronic kidney disease. No change of my recommendation. Keep same follow up appt

## 2022-05-02 ENCOUNTER — Ambulatory Visit: Payer: Self-pay

## 2022-05-02 NOTE — Patient Outreach (Addendum)
  Care Coordination   Follow Up Visit Note   05/02/2022 Name: Grazia Taffe MRN: [id number] DOB: August 30, 1940  Helon Wisinski is a 81 y.o. year old female who sees McLean-Scocuzza, Nino Glow, MD for primary care. I spoke with  [name] by phone today.  What matters to the patients health and wellness today?  Patient states she is seeing things.  Voiced frustration and concerns.  She states some of the things she sees are human form and some are horror in nature.  Patient states she has only mentioned this once to one of her providers.  Denies being on any new medications recently.   She reports having these hallucinations for 3 years.  Discussed and offered patient follow up with licensed clinical social worker.  Patient verbalized agreement.     Goals Addressed             This Visit's Progress    Controlling disturbing visions/ hallucinations       Care Coordination Interventions: Reviewed medications with patient and discussed if recently started new medications Reviewed scheduled/upcoming provider appointments  Social Work referral for disturbing visions/ hallucinations Advised patient to discuss disturbing visions/ hallucinations with provider Collaborated with [place], LCSW regarding patient having hallucinations Active listening/ support provided.           SDOH assessments and interventions completed:  No     Care Coordination Interventions Activated:  Yes  Care Coordination Interventions:  Yes, provided   Follow up plan: Follow up call scheduled for 05/21/22    Encounter Outcome:  Pt. Visit Completed   Quinn Plowman RN,BSN,CCM [place] [phone] direct line

## 2022-05-08 ENCOUNTER — Encounter: Payer: Medicare Other | Admitting: *Deleted

## 2022-05-14 ENCOUNTER — Ambulatory Visit: Payer: Self-pay | Admitting: *Deleted

## 2022-05-15 NOTE — Patient Outreach (Signed)
  Care Coordination   Initial Visit Note   05/15/2022 Name: Alexis Farmer MRN: [id number] DOB: Mar 20, 1941  Alexis Farmer is a 81 y.o. year old female who sees McLean-Scocuzza, Nino Glow, MD for primary care. I spoke with  [name] by phone today.  What matters to the patients health and wellness today?  Managing visual hallucintations    Goals Addressed             This Visit's Progress    Managing visual hallucinations       Care Coordination Interventions: Patient discussed experiencing disturbing visual hallucinations , states seeing "monsters at night" and will often feel a "presence" during the day Patient states that she does not fear the visions but is just "tired" Patient discussed that the main triggers when she is lonely or depressed Patient discussed main coping mechanism is prayer and a strong spiritual life Patient confirmed having a positive family support system, grandchildren assists with errands, grocery shopping, housekeeping Patient denied having thoughts of self harm or harm to others, discussed reluctance to follow up with a mental health provider but is willing to discuss her experiences during her next appointment  with her providers office. Depression screen reviewed  Solution-Focused Strategies employed:  Active listening / Reflection utilized  Emotional Support Provided         SDOH assessments and interventions completed:  Yes  SDOH Interventions Today    Flowsheet Row Most Recent Value  SDOH Interventions   Food Insecurity Interventions Intervention Not Indicated  Housing Interventions Intervention Not Indicated  Transportation Interventions Intervention Not Indicated        Care Coordination Interventions:  Yes, provided   Follow up plan: Follow up call scheduled for 05/29/22    Encounter Outcome:  Pt. Visit Completed

## 2022-05-15 NOTE — Patient Instructions (Signed)
Visit Information  Thank you for taking time to visit with me today. Please don't hesitate to contact me if I can be of assistance to you.   Following are the goals we discussed today:   Goals Addressed             This Visit's Progress    Managing visual hallucinations       Care Coordination Interventions: Patient discussed experiencing disturbing visual hallucinations , states seeing "monsters at night" and will often feel a "presence" during the day Patient states that she does not fear the visions but is just "tired" Patient discussed that the main triggers when she is lonely or depressed Patient discussed main coping mechanism is prayer and a strong spiritual life Patient confirmed having a positive family support system, grandchildren assists with errands, grocery shopping, housekeeping Patient denied having thoughts of self harm or harm to others, discussed reluctance to follow up with a mental health provider but is willing to discuss her experiences during her next appointment  with her providers office. Depression screen reviewed  Solution-Focused Strategies employed:  Active listening / Reflection utilized  Emotional Support Provided         Our next appointment is by telephone on 05/29/22 at 11am  Please call the care guide team at [phone] if you need to cancel or reschedule your appointment.   If you are experiencing a Mental Health or [place] or need someone to talk to, please call the Suicide and Crisis Lifeline: 988   Patient verbalizes understanding of instructions and care plan provided today and agrees to view in [place]. Active MyChart status and patient understanding of how to access instructions and care plan via MyChart confirmed with patient.     Telephone follow up appointment with care management team member scheduled for: 05/29/22  Elliot Gurney, [place] Worker  [facility] Care Management [phone]

## 2022-05-21 ENCOUNTER — Ambulatory Visit: Payer: Self-pay

## 2022-05-21 NOTE — Patient Outreach (Signed)
  Care Coordination   Follow Up Visit Note   05/21/2022 Name: Amahia Madonia MRN: [id number] DOB: 21-Jul-1940  Genelle Economou is a 81 y.o. year old female who sees McLean-Scocuzza, Nino Glow, MD for primary care. I spoke with  [name] by phone today.  What matters to the patients health and wellness today?  Patient states her blood sugar range has been from 60's to 300's.  Patient states she does not go to be with her blood sugar under 200.  She states years ago she saw her mother in a diabetic coma due to low blood sugar and it really effected her.   She states she vowed never to let her blood sugar go below 200 before going to bed.      Goals Addressed             This Visit's Progress    Patient Stated:  " Getting my diabetes under control"       Care Coordination Interventions: Discussed normal blood sugar ranges and importance of not letting blood sugars get too low or too high and management/ treatment of hypo/hyperglycemia.  Reviewed medications with patient and discussed importance of medication adherence Reviewed scheduled/upcoming provider appointments Advised to notify provider of consistent blood sugars < 70 Education article sent to patient regarding Diabetes self management         SDOH assessments and interventions completed:  No     Care Coordination Interventions:  Yes, provided   Follow up plan: Follow up call scheduled for 07/11/22    Encounter Outcome:  Pt. Visit Completed   Quinn Plowman RN,BSN,CCM [place] [phone] direct line

## 2022-05-21 NOTE — Patient Instructions (Signed)
Visit Information  Thank you for taking time to visit with me today. Please don't hesitate to contact me if I can be of assistance to you.   Following are the goals we discussed today:   Goals Addressed             This Visit's Progress    Patient Stated:  " Getting my diabetes under control"       Care Coordination Interventions: Discussed normal blood sugar ranges and management/ treatment of hypo/hyperglycemia.  Reviewed medications with patient and discussed importance of medication adherence Reviewed scheduled/upcoming provider appointments Advised to notify provider of consistent blood sugars < 70 Education article sent to patient regarding          Our next appointment is by telephone on 07/11/21 at 11 am  Please call the care guide team at [phone] if you need to cancel or reschedule your appointment.   If you are experiencing a Mental Health or [place] or need someone to talk to, please call the Suicide and Crisis Lifeline: 988 call 1-800-273-TALK (toll free, 24 hour hotline)  The patient verbalized understanding of instructions, educational materials, and care plan provided today and agreed to receive a mailed copy of patient instructions, educational materials, and care plan.   Quinn Plowman RN,BSN,CCM [facility] Care Coordination [phone] direct line   Type 2 Diabetes Mellitus, Self-Care, Adult Caring for yourself after you have been diagnosed with type 2 diabetes (type 2 diabetes mellitus) means keeping your blood sugar (glucose) under control with a balance of: Nutrition. Exercise. Lifestyle changes. Medicines or insulin, if needed. Support from your team of health care providers and others. What are the risks? Having type 2 diabetes can put you at risk for other long-term (chronic) conditions, such as heart disease and kidney disease. Your health care provider may prescribe medicines to help prevent complications from diabetes. How to  monitor your blood glucose  Check your blood glucose every day or as often as told by your health care provider. Have your A1C (hemoglobin A1C) level checked two or more times a year, or as often as told by your health care provider. Your health care provider will set personalized treatment goals for you. Generally, the goal of treatment is to maintain the following blood glucose levels: Before meals: 80-130 mg/dL (4.4-7.2 mmol/L). After meals: below 180 mg/dL (10 mmol/L). A1C level: less than 7%. How to manage hyperglycemia and hypoglycemia Hyperglycemia symptoms Hyperglycemia, also called high blood glucose, occurs when blood glucose is too high. Make sure you know the early signs of hyperglycemia, such as: Increased thirst. Hunger. Feeling very tired. Needing to urinate more often than usual. Blurry vision. Hypoglycemia symptoms Hypoglycemia, also called low blood glucose, occurs with a blood glucose level at or below 70 mg/dL (3.9 mmol/L). Diabetes medicines lower your blood glucose and can cause hypoglycemia. The risk for hypoglycemia increases during or after exercise, during sleep, during illness, and when skipping meals or not eating for a long time (fasting). It is important to know the symptoms of hypoglycemia and treat it right away. Always have a 15-gram rapid-acting carbohydrate snack with you to treat low blood glucose. Family members and close friends should also know the symptoms and understand how to treat hypoglycemia, in case you are not able to treat yourself. Symptoms may include: Hunger. Anxiety. Sweating and feeling clammy. Dizziness or feeling light-headed. Sleepiness. Increased heart rate. Irritability. Tingling or numbness around the mouth, lips, or tongue. Restless sleep. Severe hypoglycemia is when  your blood glucose level is at or below 54 mg/dL (3 mmol/L). Severe hypoglycemia is an emergency. Do not wait to see if the symptoms will go away. Get medical help  right away. Call your local emergency services (911 in the [place] Do not drive yourself to the hospital. If you have severe hypoglycemia and you cannot eat or drink, you may need glucagon. A family member or close friend should learn how to check your blood glucose and how to give you glucagon. Ask your health care provider if you need to have an emergency glucagon kit available. Follow these instructions at home: Medicines Take prescribed insulin or diabetes medicines as told by your health care provider. Do not run out of insulin or other diabetes medicines. Plan ahead so you always have these available. If you use insulin, adjust your dosage based on your physical activity and what foods you eat. Your health care provider will tell you how to adjust your dosage. Take over-the-counter and prescription medicines only as told by your health care provider. Eating and drinking  What you eat and drink affects your blood glucose and your insulin dosage. Making good choices helps to control your diabetes and prevent other health problems. A healthy meal plan includes eating lean proteins, complex carbohydrates, fresh fruits and vegetables, low-fat dairy products, and healthy fats. Make an appointment to see a registered dietitian to help you create an eating plan that is right for you. Make sure that you: Follow instructions from your health care provider about eating or drinking restrictions. Drink enough fluid to keep your urine pale yellow. Keep a record of the carbohydrates that you eat. Do this by reading food labels and learning the standard serving sizes of foods. Follow your sick-day plan whenever you cannot eat or drink as usual. Make this plan in advance with your health care provider.  Activity Stay active. Exercise regularly, as told by your health care provider. This may include: Stretching and doing strength exercises, such as yoga or weight lifting, two or more times a week. Doing 150  minutes or more of moderate-intensity or vigorous-intensity exercise each week. This could be brisk walking, biking, or water aerobics. Spread out your activity over 3 or more days of the week. Do not go more than 2 days in a row without doing some kind of physical activity. When you start a new exercise or activity, work with your health care provider to adjust your insulin, medicines, or food intake as needed. Lifestyle Do not use any products that contain nicotine or tobacco. These products include cigarettes, chewing tobacco, and vaping devices, such as e-cigarettes. If you need help quitting, ask your health care provider. If you drink alcohol and your health care provider says that it is safe for you: Limit how much you have to: 0-1 drink a day for women who are not pregnant. 0-2 drinks a day for men. Know how much alcohol is in your drink. In the [place] one drink equals one 12 oz bottle of beer (355 mL), one 5 oz glass of wine (148 mL), or one 1 oz glass of hard liquor (44 mL). Learn to manage stress. If you need help with this, ask your health care provider. Take care of your body  Keep your immunizations up to date. In addition to getting vaccinations as told by your health care provider, it is recommended that you get vaccinated against the following illnesses: The flu (influenza). Get a flu shot every year. Pneumonia. Hepatitis B.  Schedule an eye exam soon after your diagnosis, and then one time every year after that. Check your skin and feet every day for cuts, bruises, redness, blisters, or sores. Schedule a foot exam with your health care provider once every year. Brush your teeth and gums two times a day, and floss one or more times a day. Visit your dentist one or more times every 6 months. Maintain a healthy weight. General instructions Share your diabetes management plan with people in your workplace, school, and household. Carry a medical alert card or wear medical alert  jewelry. Keep all follow-up visits. This is important. Questions to ask your health care provider Should I meet with a certified diabetes care and education specialist? Where can I find a support group for people with diabetes? Where to find more information For help and guidance and for more information about diabetes, please visit: American Diabetes Association (ADA): www.diabetes.org American Association of Diabetes Care and Education Specialists (ADCES): www.diabeteseducator.org International Diabetes Federation (IDF): MemberVerification.ca Summary Caring for yourself after you have been diagnosed with type 2 diabetes (type 2 diabetes mellitus) means keeping your blood sugar (glucose) under control with a balance of nutrition, exercise, lifestyle changes, and medicine. Check your blood glucose every day, as often as told by your health care provider. Having diabetes can put you at risk for other long-term (chronic) conditions, such as heart disease and kidney disease. Your health care provider may prescribe medicines to help prevent complications from diabetes. Share your diabetes management plan with people in your workplace, school, and household. Keep all follow-up visits. This is important. This information is not intended to replace advice given to you by your health care provider. Make sure you discuss any questions you have with your health care provider. Document Revised: 11/02/2020 Document Reviewed: 11/02/2020 Elsevier Patient Education  [place].

## 2022-05-29 ENCOUNTER — Ambulatory Visit: Payer: Self-pay | Admitting: *Deleted

## 2022-05-29 NOTE — Patient Outreach (Addendum)
  Care Coordination   Follow Up Visit Note   05/29/2022 Name: Alexis Farmer MRN: [id number] DOB: 03/23/1941  Alexis Farmer is a 81 y.o. year old female who sees McLean-Scocuzza, Nino Glow, MD for primary care. I spoke with  [name] by phone today.  What matters to the patients health and wellness today?  Phone call to patient for scheduled call, patient was not available to talk at this time. Appointment re-scheduled for 05/30/22 at 1pm.   Goals Addressed   None     SDOH assessments and interventions completed:  No     Care Coordination Interventions:  No, not indicated   Follow up plan: Follow up call scheduled for 05/30/22    Encounter Outcome:  Pt. Visit Completed

## 2022-05-30 ENCOUNTER — Ambulatory Visit: Payer: Self-pay | Admitting: *Deleted

## 2022-05-31 NOTE — Patient Instructions (Signed)
Visit Information  Thank you for taking time to visit with me today. Please don't hesitate to contact me if I can be of assistance to you.   Following are the goals we discussed today:   Goals Addressed             This Visit's Progress    Managing visual hallucinations       Care Coordination Interventions:  Patient continues to discusse experiencing disturbing visual hallucinations , states seeing "monsters at night" and will often feel a "presence" during the day Patient continues to state that she does not fear the visions but is just "tired" of the visions and in need of some rest Patient discussed main coping mechanism is continual prayer and a strong spiritual life Patient continues to discuss plan to follow up with her provider next month Discussed possible follow up with a psychiatrist as well-common myths regarding follow up with mental health providers processed Solution-Focused Strategies employed:  Active listening / Reflection utilized  Emotional Support Provided         Our next appointment is by telephone on 06/21/21 at 1pm  Please call the care guide team at [phone] if you need to cancel or reschedule your appointment.   If you are experiencing a Mental Health or [place] or need someone to talk to, please call the Suicide and Crisis Lifeline: 988   Patient verbalizes understanding of instructions and care plan provided today and agrees to view in [place]. Active MyChart status and patient understanding of how to access instructions and care plan via MyChart confirmed with patient.     Telephone follow up appointment with care management team member scheduled for: 06/21/21  Elliot Gurney, [place] Worker  [facility] Care Management [phone]

## 2022-05-31 NOTE — Patient Outreach (Signed)
  Care Coordination   Follow Up Visit Note   05/31/2022 Name: Keyairra Kolinski MRN: [id number] DOB: 1940-09-20  Raya Mckinstry is a 81 y.o. year old female who sees McLean-Scocuzza, Nino Glow, MD for primary care. I spoke with  [name] by phone today.  What matters to the patients health and wellness today?  Emotional support and resources    Goals Addressed             This Visit's Progress    Managing visual hallucinations       Care Coordination Interventions:  Patient continues to discusse experiencing disturbing visual hallucinations , states seeing "monsters at night" and will often feel a "presence" during the day Patient continues to state that she does not fear the visions but is just "tired" of the visions and in need of some rest Patient discussed main coping mechanism is continual prayer and a strong spiritual life Patient continues to discuss plan to follow up with her provider next month Discussed possible follow up with a psychiatrist as well-common myths regarding follow up with mental health providers processed Solution-Focused Strategies employed:  Active listening / Reflection utilized  Emotional Support Provided         SDOH assessments and interventions completed:  No     Care Coordination Interventions:  Yes, provided   Follow up plan: Follow up call scheduled for 06/21/21    Encounter Outcome:  Pt. Visit Completed

## 2022-06-12 ENCOUNTER — Ambulatory Visit: Payer: Medicare Other | Admitting: Cardiovascular Disease

## 2022-06-13 ENCOUNTER — Other Ambulatory Visit: Payer: Self-pay | Admitting: Cardiovascular Disease

## 2022-06-13 DIAGNOSIS — R002 Palpitations: Secondary | ICD-10-CM

## 2022-06-13 DIAGNOSIS — I10 Essential (primary) hypertension: Secondary | ICD-10-CM

## 2022-06-13 DIAGNOSIS — I6523 Occlusion and stenosis of bilateral carotid arteries: Secondary | ICD-10-CM

## 2022-06-21 ENCOUNTER — Ambulatory Visit: Payer: Self-pay | Admitting: *Deleted

## 2022-06-21 NOTE — Patient Outreach (Signed)
  Care Coordination   06/21/2022 Name: Alexis Farmer MRN: [id number] DOB: 07/06/1940   Care Coordination Outreach Attempts:  An unsuccessful telephone outreach was attempted for a scheduled appointment today.  Follow Up Plan:  Additional outreach attempts will be made to offer the patient care coordination information and services.   Encounter Outcome:  No Answer   Care Coordination Interventions:  No, not indicated    Judah Chevere, [place] Worker  [facility] Care Management [phone]

## 2022-07-05 ENCOUNTER — Ambulatory Visit: Payer: Self-pay | Admitting: *Deleted

## 2022-07-05 NOTE — Patient Outreach (Signed)
  Care Coordination   Follow Up Visit Note   07/05/2022 Name: Alexis Farmer MRN: [id number] DOB: [birth year]  Alexis Farmer is a 82 y.o. year old female who sees No primary care provider on file. for primary care. I spoke with  [name] by phone today.  What matters to the patients health and wellness today?  Mental Health support    Goals Addressed             This Visit's Progress    Managing visual hallucinations       Care Coordination Interventions: Patient continues to  experience disturbing visual hallucinations, however episodes have decreased Patient discussed concern related to weakness in her legs, reports difficulty standing and moving-agreeable to discussing this with her primary care doctor Patient discussed continued plan to follow up with her physician on 07/18/22-new patient appointment scheduled with Carollee Leitz, MD [facility] hallucinations and weakness in legs to be addressed Solution-Focused Strategies employed:  Active listening / Reflection utilized  Emotional Support Provided         SDOH assessments and interventions completed:  No     Care Coordination Interventions:  Yes, provided   Follow up plan: Follow up call scheduled for 07/18/22    Encounter Outcome:  Pt. Visit Completed

## 2022-07-05 NOTE — Patient Instructions (Signed)
Visit Information  Thank you for taking time to visit with me today. Please don't hesitate to contact me if I can be of assistance to you.   Following are the goals we discussed today:   Goals Addressed             This Visit's Progress    Managing visual hallucinations       Care Coordination Interventions: Patient continues to  experience disturbing visual hallucinations, however episodes have decreased Patient discussed concern related to weakness in her legs, reports difficulty standing and moving-agreeable to discussing this with her primary care doctor Patient discussed continued plan to follow up with her physician on 07/18/22-new patient appointment scheduled with Carollee Leitz, MD [facility] hallucinations and weakness in legs to be addressed Solution-Focused Strategies employed:  Active listening / Reflection utilized  Emotional Support Provided         Our next appointment is in-person at @PCP @'s office on 07/18/22 at 11am  Please call the care guide team at [phone] if you need to cancel or reschedule your appointment.   If you are experiencing a Mental Health or [place] or need someone to talk to, please call the Suicide and Crisis Lifeline: 988   Patient verbalizes understanding of instructions and care plan provided today and agrees to view in [place]. Active MyChart status and patient understanding of how to access instructions and care plan via MyChart confirmed with patient.     Next PCP appointment scheduled for:  07/18/22  Elliot Gurney, [place] Worker  [facility] Care Management [phone]

## 2022-07-09 NOTE — Progress Notes (Signed)
Cardiology Office Note  Date:  07/10/2022   ID:  Alexis Farmer, DOB May 24, 1941, MRN [id number]  PCP:  McLean-Scocuzza, Nino Glow, MD   Chief Complaint  Patient presents with   6 month follow up     Patient c/o weakness, heaviness in legs, shortness of breath with walking and left leg swelling. Medications reviewed by the patient verbally.      HPI:  Alexis Farmer is a 82 y.o. woman with history of  obesity,  insulin-dependent diabetes, Hemoglobin A1c >9 poor diet, hypertension,  hyperlipidemia,  moderate LVH  hospital 08/13/2014  chest pain, malignant hypertension, possible sleep apnea,  Anxiety Notes indicate previous history of GI bleeding  colonoscopy which was normal by her report Chronic lower extremity edema Chronic renal insufficiency Morbid obesity Right subclavian stenosis She presents to for follow-up of her hypertension, chronic diastolic CHF, poorly controlled diabetes, CKD  Last office visit with myself July 1027  Out of bystolic, pharmacy does not have it She brings blood pressure readings with her typically running 253 up to 664 systolic She feels these numbers are adequate and there is not as high as they were several years ago Legs are weak, sedentary, no regular walking program  Lab work reviewed June 2023 A1c 9.6 in June 2023 LDL 46, total chol 129  Hospitalization February 2023, ileus Nausea vomiting diarrhea abdominal pain/gastroenteritis  EKG personally reviewed by myself on todays visit Normal sinus rhythm rate 65 bpm poor R wave progression to the anterior precordial leads, left axis deviation, no change compared to 2022  Other past medical history reviewed Followed by vascular in [facility] Carotid u/s  Normal flow hemodynamics were seen in the left subclavian  artery. The right subclavian artery has dampened monophasic flow,  retrograde vertebral artery flow, as well as difference in  brachial  artery pressuress suggestive of right  subclavian steal   hospital end of June 2022 Symptoms of anxiety.  Patient reports that worsening tinnitus, lightheadedness and flushed.  She also report urinary frequency, urgency lower extremity edema, mild nausea.  Troponin of 25 felt secondary to elevated blood pressure  echocardiogram reviewed from June 2022   1. Left ventricular ejection fraction, by estimation, is 60 to 65%. The  left ventricle has normal function. The left ventricle has no regional  wall motion abnormalities. There is moderate concentric left ventricular  hypertrophy. Indeterminate diastolic  filling due to E-A fusion.   2. Right ventricular systolic function is normal. The right ventricular  size is normal. Tricuspid regurgitation signal is inadequate for assessing  PA pressure.   CT chest  Calcified coronary artery and aortic atherosclerosis. 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 Negative for thoracic aortic aneurysm or dissection. Calcified atherosclerosis involving the other proximal great vessels which appear patent  10/2016  D&C, hysteroscopy.  Postmenopausal bleeding.   Previous Echocardiogram 08/13/2014 showed normal ejection fraction, normal RV size and function, moderate LVH   Renal artery duplex showing no blockage of the mid to distal vessels bilaterally, challenging study    PMH:   has a past medical history of Arthritis, Chickenpox, Diabetes mellitus without complication ([place]), Diverticulitis, GI bleed, High cholesterol, History of blood transfusion, Hypertension, and Renal insufficiency.  PSH:    Past Surgical History:  Procedure Laterality Date   APPENDECTOMY     CHOLECYSTECTOMY     ECTOPIC PREGNANCY SURGERY     EYE SURGERY     bilateral  cataracts   EYE SURGERY     02/11/2019 repair hole in right eye    gallbladder      HYSTEROSCOPY WITH D & C N/A 10/26/2016   Procedure:  DILATATION AND CURETTAGE /HYSTEROSCOPY;  Surgeon: Benjaman Kindler, MD;  Location: [facility] ORS;  Service: Gynecology;  Laterality: N/A;   HYSTEROSCOPY WITH D & C N/A 07/07/2018   Procedure: DILATATION AND CURETTAGE /HYSTEROSCOPY;  Surgeon: Benjaman Kindler, MD;  Location: [facility] ORS;  Service: Gynecology;  Laterality: N/A;   THYROIDECTOMY, PARTIAL      Current Outpatient Medications  Medication Sig Dispense Refill   ACCU-CHEK GUIDE test strip USE AS INSTRUCTED 3 TIMES DAILY 300 strip 12   acetaminophen (TYLENOL) 500 MG tablet Take 500 mg by mouth every 6 (six) hours as needed for mild pain or headache.     Ascorbic Acid (VITAMIN C PO) Take 1 tablet by mouth daily.     aspirin EC 81 MG tablet Take 81 mg by mouth 2 (two) times a week. Swallow whole.     BD INSULIN SYRINGE U/F 30G X 1/2" 0.5 ML MISC USE UP TO 5X PER DAY WITH INSULIN 2X (NPH) AND 3X (REGULAR) 500 each 12   blood glucose meter kit and supplies KIT Accu chek, Dx code E11.65, check 3 times daily 1 each 0   Blood Pressure KIT 1 Device by Does not apply route daily. 1 kit 0   cloNIDine (CATAPRES) 0.2 MG tablet Take 1 tablet (0.2 mg total) by mouth 2 (two) times daily. 270 tablet 3   hydrALAZINE (APRESOLINE) 100 MG tablet TAKE 1 TABLET (100 MG TOTAL) BY MOUTH 4 (FOUR) TIMES DAILY. 364 tablet 0   insulin NPH Human (NOVOLIN N) 100 UNIT/ML injection INJECT 20 UNITS IN THE MORNING AND THE EVENING WITH A MEAL 40 mL PRN   insulin regular (NOVOLIN R) 100 units/mL injection Inject 0-0.08 mLs (0-8 Units total) into the skin 3 (three) times daily before meals. 30 mL 11   LORazepam (ATIVAN) 1 MG tablet Take 0.5 tablets (0.5 mg total) by mouth daily as needed for anxiety. Inform pt 0.5 dose on backorder has to cut this in 1/2 dose 15 tablet 5   Multiple Vitamins-Minerals (CENTRUM SILVER 50+WOMEN) TABS Take 1 tablet by mouth daily with breakfast.     nystatin (MYCOSTATIN/NYSTOP) powder Apply topically 2 (two) times daily. Under abdomen 60 g 11    polyethylene glycol powder (GLYCOLAX/MIRALAX) 17 GM/SCOOP powder Take 17 g by mouth daily as needed for moderate constipation or severe constipation. Can take up to 2x per day prn. Mix with 8 ounces of liquid 3350 g 11   PYRIDOXINE HCL PO Take 1 tablet by mouth daily.     rosuvastatin (CRESTOR) 20 MG tablet TAKE 1 TABLET BY MOUTH EVERY DAY 90 tablet 3   spironolactone (ALDACTONE) 25 MG tablet Take 1 tablet (25 mg total) by mouth daily. 90 tablet 3   torsemide (DEMADEX) 10 MG tablet Take 1 tablet (10 mg total) by mouth daily. 90 tablet 3   valsartan (DIOVAN) 160 MG tablet TAKE 1 TABLET BY MOUTH EVERY DAY 90 tablet 0   diazepam (VALIUM) 5 MG tablet 1 PO 1 hour beofre MRI procedure (Patient not taking: Reported on 04/10/2022)     gabapentin (NEURONTIN) 300 MG capsule TAKE 1 CAPSULE AT BEDTIME FOR 7 DAYS THEN 1 CAP TWICE DAILY (Patient not taking: Reported on 04/10/2022)     nebivolol (BYSTOLIC) 10 MG tablet Take 1 tablet (10 mg total) by mouth  daily. (Patient not taking: Reported on 07/10/2022) 90 tablet 1   ondansetron (ZOFRAN) 4 MG tablet Take 1 tablet (4 mg total) by mouth every 6 (six) hours as needed for nausea. (Patient not taking: Reported on 12/13/2021) 20 tablet 0   No current facility-administered medications for this visit.    Allergies:   Celexa [citalopram], Jardiance [empagliflozin], Norvasc [amlodipine], Tape, Penicillin v, Penicillin v potassium, and Penicillins   Social History:  The patient  reports that she has never smoked. She has never used smokeless tobacco. She reports that she does not drink alcohol and does not use drugs.   Family History:   family history includes Diabetes in her mother, sister, and another family member; Healthy in her father; Heart disease in her sister; Hypertension in her mother; Stroke in her mother.    Review of Systems: Review of Systems  Constitutional: Negative.   Eyes: Negative.   Respiratory: Negative.    Cardiovascular:  Positive for leg  swelling.  Gastrointestinal: Negative.   Genitourinary: Negative.   Musculoskeletal: Negative.   Neurological: Negative.   Psychiatric/Behavioral: Negative.    All other systems reviewed and are negative.   PHYSICAL EXAM: VS:  BP (!) 140/60 (BP Location: Left Arm, Patient Position: Sitting, Cuff Size: Large)   Pulse 65   Ht 5\' 3"  (1.6 m)   Wt 249 lb (112.9 kg)   SpO2 98%   BMI 44.11 kg/m  , BMI Body mass index is 44.11 kg/m. Constitutional:  oriented to person, place, and time. No distress.  HENT:  Head: Grossly normal Eyes:  no discharge. No scleral icterus.  Neck: No JVD, no carotid bruits  Cardiovascular: Regular rate and rhythm, no murmurs appreciated 1-2+ woody edema left greater than right, minimal pitting Pulmonary/Chest: Clear to auscultation bilaterally, no wheezes or rails Abdominal: Soft.  no distension.  no tenderness.  Musculoskeletal: Normal range of motion Neurological:  normal muscle tone. Coordination normal. No atrophy Skin: Skin warm and dry Psychiatric: normal affect, pleasant  Recent Labs: 12/13/2021: TSH 2.21 04/06/2022: ALT 21; BUN 28; Creatinine, Ser 1.45; Hemoglobin 12.4; Platelets 235; Potassium 4.1; Sodium 138    Lipid Panel Lab Results  Component Value Date   CHOL 129 12/13/2021   HDL 45.40 12/13/2021   LDLCALC 40 12/15/2020   TRIG 215.0 (H) 12/13/2021      Wt Readings from Last 3 Encounters:  07/10/22 249 lb (112.9 kg)  04/10/22 243 lb 4.8 oz (110.4 kg)  03/21/22 243 lb 12.8 oz (110.6 kg)     ASSESSMENT AND PLAN:  Essential hypertension, benign Blood pressures continue to run high 1 63-8 60 systolic She is requesting no medication change at this time, would prefer [place] to call her pharmacy to find out about bystolic.  She has been out of this for 2 days, told by the pharmacy they will no longer carry it Other medications that could be used include carvedilol but she does not want to change and does not want to try different pharmacy  as family picks up her medications for her (granddaughter) Addendum: We have contacted [facility], they report they do have bystolic and they had recently filled a 90-day prescription, she was trying to fill her prescription too early and if she is lost her medication, the pharmacy can petition to insurance for an override to fill new prescription early.   Venous insufficiency/chronic leg swelling Recommended compression hose, leg elevation, Ace wraps if needed Avoid calcium channel blockers Leg elevation recommended Continue torsemide 10 mg daily  Mixed hyperlipidemia On crestor 10 Cholesterol at goal  Uncontrolled type 2 diabetes mellitus with stage 3 chronic kidney disease, with long-term current use of insulin (HCC) Uncontrolled, A1C around 9, working with endocrine Long history of poorly controlled,  Low carbohydrate diet recommended  CKD (chronic kidney disease) stage 3, GFR 30-59 ml/min Followed by nephrology, Dr. Candiss Norse On torsemide 10 daily, tolerating valsartan  Morbid obesity ([place]) Unable to exercise.  Leg weakness Obesity hypoventilation  Walks with a walker, presents with a walker today  Chronic diastolic CHF (congestive heart failure) (HCC) Appears euvolemic, leg swelling out of proportion to CHF symptoms On low-dose torsemide 10 daily  PAD Followed by vascular Subclavian stenosis on the right, having minimal symptoms Drop in pressure noted previously on right    Total encounter time more than 30 minutes  Greater than 50% was spent in counseling and coordination of care with the patient   Orders Placed This Encounter  Procedures   EKG 12-Lead      Signed, Esmond Plants, M.D., Ph.D. 07/10/2022  [place], [place]

## 2022-07-10 ENCOUNTER — Encounter: Payer: Self-pay | Admitting: Cardiovascular Disease

## 2022-07-10 ENCOUNTER — Ambulatory Visit: Payer: Medicare Other | Attending: Cardiovascular Disease | Admitting: Cardiovascular Disease

## 2022-07-10 VITALS — BP 140/60 | HR 65 | Ht 63.0 in | Wt 249.0 lb

## 2022-07-10 DIAGNOSIS — E785 Hyperlipidemia, unspecified: Secondary | ICD-10-CM | POA: Insufficient documentation

## 2022-07-10 DIAGNOSIS — E782 Mixed hyperlipidemia: Secondary | ICD-10-CM | POA: Diagnosis present

## 2022-07-10 DIAGNOSIS — G458 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: Secondary | ICD-10-CM | POA: Diagnosis present

## 2022-07-10 DIAGNOSIS — I5032 Chronic diastolic (congestive) heart failure: Secondary | ICD-10-CM | POA: Insufficient documentation

## 2022-07-10 DIAGNOSIS — I152 Hypertension secondary to endocrine disorders: Secondary | ICD-10-CM | POA: Insufficient documentation

## 2022-07-10 DIAGNOSIS — I10 Essential (primary) hypertension: Secondary | ICD-10-CM | POA: Diagnosis not present

## 2022-07-10 DIAGNOSIS — E1165 Type 2 diabetes mellitus with hyperglycemia: Secondary | ICD-10-CM | POA: Insufficient documentation

## 2022-07-10 DIAGNOSIS — L304 Erythema intertrigo: Secondary | ICD-10-CM | POA: Insufficient documentation

## 2022-07-10 DIAGNOSIS — I6523 Occlusion and stenosis of bilateral carotid arteries: Secondary | ICD-10-CM | POA: Diagnosis not present

## 2022-07-10 DIAGNOSIS — I739 Peripheral vascular disease, unspecified: Secondary | ICD-10-CM | POA: Insufficient documentation

## 2022-07-10 DIAGNOSIS — I700 Atherosclerosis of aorta: Secondary | ICD-10-CM | POA: Diagnosis present

## 2022-07-10 DIAGNOSIS — R002 Palpitations: Secondary | ICD-10-CM | POA: Diagnosis present

## 2022-07-10 DIAGNOSIS — E1159 Type 2 diabetes mellitus with other circulatory complications: Secondary | ICD-10-CM | POA: Diagnosis present

## 2022-07-10 MED ORDER — NEBIVOLOL HCL 10 MG PO TABS: 10.0000 mg | ORAL_TABLET | Freq: Every day | ORAL | 2 refills | Status: DC

## 2022-07-10 NOTE — Patient Instructions (Addendum)
BP chart for home  We will call to see if bystolic is in stock at [facility] [place]  Medication Instructions:  No changes  If you need a refill on your cardiac medications before your next appointment, please call your pharmacy.   Lab work: No new labs needed  Testing/Procedures: No new testing needed  Follow-Up: At [facility], you and your health needs are our priority.  As part of our continuing mission to provide you with exceptional heart care, we have created designated Provider Care Teams.  These Care Teams include your primary Cardiologist (physician) and Advanced Practice Providers (APPs -  Physician Assistants and Nurse Practitioners) who all work together to provide you with the care you need, when you need it.  You will need a follow up appointment in 12 months  Providers on your designated Care Team:   Murray Hodgkins, NP Christell Faith, PA-C Cadence Kathlen Mody, [place]  COVID-19 Vaccine Information can be found at: [email or web address] For questions related to vaccine distribution or appointments, please email [email or web address] or call [phone].

## 2022-07-11 ENCOUNTER — Ambulatory Visit: Payer: Self-pay

## 2022-07-11 ENCOUNTER — Telehealth: Payer: Self-pay

## 2022-07-11 NOTE — Patient Outreach (Signed)
  Care Coordination   Follow Up Visit Note   07/11/2022 Name: Alexis Farmer MRN: [id number] DOB: January 04, 1941  Alexis Farmer is a 82 y.o. year old female who sees McLean-Scocuzza, Nino Glow, MD for primary care. I spoke with  Alexis Farmer by phone today.  What matters to the patients health and wellness today?  Health conditions    Goals Addressed             This Visit's Progress    Patient Stated:  " Getting my diabetes under control and manage heart failure"       Care Coordination Interventions: Evaluation of current treatment plan related to diabetes / heart failure and patient's adherence to plan as established by provider:  Patient states her blood sugars are still ranging from 60's to 400's.  She states she feels her blood sugars go up because she over treats her low blood sugars.  Patient states if she has a low blood sugar she may treat it will juice by adding additional sugar in the juice.  Patient states, " I panic when my blood sugar gets in the 60's so I over treat." Patient states she doesn't have much of an appetite. Patient stats she doesn't weigh and states she has swelling in her feet and legs off and on. She states she has dealt with this for some years. Patient reports her current weight at the doctors office on yesterday was 249 lbs.  Patient states she doesn't weigh daily or have scales.  Discussed plans with patient for ongoing care management follow up  and offered to have in office visit with patient at next primary care provider office visit. Patient verbally agreed to in office visit with [facility] for next follow up .  Encouraged patient to get scales and start to weigh daily and record.  Advised to notify doctor for a weight gain of 3 lbs overnight or 5 lbs in a week.  Discussed with and offered patient referral for meals on wheels;  Patient declined stating the Meals on wheel food is not good.  Discussed with patient need to not over treat low blood sugars.   Discussed Rule of 15 to treat hypoglycemia. Discussed normal blood sugar ranges and importance of not letting blood sugars get too low or too high and management/ treatment of hypo/hyperglycemia.  Reviewed medications with patient and discussed importance of medication adherence Reviewed scheduled/upcoming provider appointments Advised to notify provider of consistent blood sugars < 70          SDOH assessments and interventions completed:  No     Care Coordination Interventions:  Yes, provided   Follow up plan: Follow up call scheduled for 07/18/22    Encounter Outcome:  Pt. Visit Completed   Quinn Plowman RN,BSN,CCM [place] [phone] direct line

## 2022-07-12 NOTE — Patient Outreach (Signed)
  Care Coordination   Follow Up Visit Note   07/12/2022 Late entry 07/11/22 Name: Alexis Farmer MRN: [id number] DOB: September 07, 1940  Alexis Farmer is a 82 y.o. year old female who sees McLean-Scocuzza, Nino Glow, MD for primary care. I spoke with  [name] by phone today.  What matters to the patients health and wellness today?  Ongoing management of health conditions    Goals Addressed             This Visit's Progress    Patient Stated:  " Getting my diabetes under control and manage heart failure"       Care Coordination Interventions: Evaluation of current treatment plan related to diabetes / heart failure and patient's adherence to plan as established by provider:  Patient states her blood sugars are still ranging from 60's to 400's.  She states she feels her blood sugars go up because she over treats her low blood sugars.  Patient states if she has a low blood sugar she may treat it will juice by adding additional sugar in the juice.  Patient states, " I panic when my blood sugar gets in the 60's so I over treat." Patient states she doesn't have much of an appetite. Patient stats she doesn't weigh and states she has swelling in her feet and legs off and on. She states she has dealt with this for some years. Patient reports her current weight at the doctors office on yesterday was 249 lbs.  Patient states she doesn't weigh daily or have scales.  Discussed plans with patient for ongoing care management follow up  and offered to have in office visit with patient at next primary care provider office visit. Patient verbally agreed to in office visit with [facility] for next follow up .  Encouraged patient to get scales and start to weigh daily and record.  Advised to notify doctor for a weight gain of 3 lbs overnight or 5 lbs in a week.  Discussed with and offered patient referral for meals on wheels;  Patient declined stating the Meals on wheel food is not good.  Discussed with patient need  to not over treat low blood sugars.  Discussed Rule of 15 to treat hypoglycemia. Discussed normal blood sugar ranges and importance of not letting blood sugars get too low or too high and management/ treatment of hypo/hyperglycemia.  Reviewed medications with patient and discussed importance of medication adherence Reviewed scheduled/upcoming provider appointments Advised to notify provider of consistent blood sugars < 70              SDOH assessments and interventions completed:  No     Care Coordination Interventions:  Yes, provided   Follow up plan: Follow up call scheduled for 07/18/22    Encounter Outcome:  Pt. Visit Completed   Quinn Plowman RN,BSN,CCM [place] [phone] direct line

## 2022-07-17 ENCOUNTER — Telehealth: Payer: Self-pay

## 2022-07-17 NOTE — Patient Outreach (Signed)
  Care Coordination   07/17/2022 Name: Alexis Farmer MRN: [id number] DOB: 1940-08-13   Care Coordination Outreach Attempts:  An unsuccessful telephone outreach was attempted today to offer the patient information about available care coordination services as a benefit of their health plan.  HIPAA compliant message left with call back phone number. Requested return call.  Follow Up Plan:  Additional outreach attempts will be made to offer the patient care coordination information and services.   Encounter Outcome:  No Answer   Care Coordination Interventions:  No, not indicated    Quinn Plowman [facility] [phone] direct line

## 2022-07-18 ENCOUNTER — Ambulatory Visit: Payer: Self-pay | Admitting: *Deleted

## 2022-07-18 ENCOUNTER — Ambulatory Visit: Payer: Self-pay

## 2022-07-18 ENCOUNTER — Encounter: Payer: Self-pay | Admitting: Family Medicine

## 2022-07-18 ENCOUNTER — Ambulatory Visit (INDEPENDENT_AMBULATORY_CARE_PROVIDER_SITE_OTHER): Payer: Medicare Other | Admitting: Family Medicine

## 2022-07-18 VITALS — BP 144/78 | HR 76 | Temp 98.0°F | Resp 16 | Ht 63.0 in | Wt 246.1 lb

## 2022-07-18 DIAGNOSIS — N1832 Chronic kidney disease, stage 3b: Secondary | ICD-10-CM

## 2022-07-18 DIAGNOSIS — Z23 Encounter for immunization: Secondary | ICD-10-CM | POA: Diagnosis not present

## 2022-07-18 DIAGNOSIS — E1122 Type 2 diabetes mellitus with diabetic chronic kidney disease: Secondary | ICD-10-CM | POA: Diagnosis not present

## 2022-07-18 DIAGNOSIS — E1159 Type 2 diabetes mellitus with other circulatory complications: Secondary | ICD-10-CM

## 2022-07-18 DIAGNOSIS — R441 Visual hallucinations: Secondary | ICD-10-CM

## 2022-07-18 DIAGNOSIS — E1129 Type 2 diabetes mellitus with other diabetic kidney complication: Secondary | ICD-10-CM

## 2022-07-18 DIAGNOSIS — E042 Nontoxic multinodular goiter: Secondary | ICD-10-CM

## 2022-07-18 DIAGNOSIS — I152 Hypertension secondary to endocrine disorders: Secondary | ICD-10-CM

## 2022-07-18 NOTE — Patient Instructions (Addendum)
It was a pleasure meeting you today. Thank you for allowing me to take part in your health care.  Our goals for today as we discussed include:  Will get your most recent labs  Recommend limited use of Lorazepam  Will refer to psychiatry   You received the Pneumonia 20 vaccine today. Do not need any further vaccines  Recommend Tetanus Vaccination.  This is given every 10 years.    If you have any questions or concerns, please do not hesitate to call the office at [phone].  I look forward to our next visit and until then take care and stay safe.  Regards,   Carollee Leitz, MD   [facility]

## 2022-07-18 NOTE — Progress Notes (Signed)
SUBJECTIVE:   Chief Complaint  Patient presents with   Establish Care    Transfer of care   HPI Presents to clinic to transfer care.  No acute concerns today.  Hypertension Asymptomatic.  Does not check blood pressures at home.  Compliant with current blood pressure medication and follows with cardiology.  Currently takes Catapres 0.2 mg 3 times daily, hydralazine 100 mg 4 times daily, nebivolol 10 mg daily, Aldactone 25 mg daily, and Diovan 160 mg daily and Demadex 10 mg daily.  Denies any visual changes, headaches, chest pain, shortness of breath, abdominal pain or lower extremity edema.  Diabetes type 2 Asymptomatic.  Does not check blood blood glucose at home.  Takes NPH 20 units every morning and 15 units every afternoon.  Also on sliding scale Humulin R insulin 3 times daily with meals.  Denies any hypoglycemic events.  Follows with endocrinology.  Mood disorder Prescribed Ativan 0.5 mg as needed.  Patient reports loss took medication 3 months ago.  Hallucinations Currently asymptomatic.  Reports previously seeing demons few months ago.  Has slowed down.  Demons are now people.  They do not speak to her.  She reports that she prays and they go away.  Has been seeing her pastor which has helped.  Denies any auditory hallucinations.  Denies any SI/HI.  Lives alone.  Feels safe.  Does not want medications at this time.  Is open to psychiatry referral.  Denies any fevers, urinary symptoms, headaches, visual changes, cough, abdominal pain, nausea or vomiting.   PERTINENT PMH / PSH: Hypertension CHF DM type II Thyroid nodule   OBJECTIVE:  BP (!) 144/78   Pulse 76   Temp 98 F (36.7 C)   Resp 16   Ht 5\' 3"  (1.6 m)   Wt 246 lb 2 oz (111.6 kg)   SpO2 99%   BMI 43.60 kg/m    Physical Exam Vitals reviewed.  Constitutional:      General: She is not in acute distress.    Appearance: She is obese. She is not ill-appearing.  HENT:     Head: Normocephalic.     Nose: Nose  normal.  Eyes:     Conjunctiva/sclera: Conjunctivae normal.  Cardiovascular:     Rate and Rhythm: Normal rate and regular rhythm.     Heart sounds: Normal heart sounds.  Pulmonary:     Effort: Pulmonary effort is normal.     Breath sounds: Normal breath sounds.  Abdominal:     General: Abdomen is flat. Bowel sounds are normal.     Palpations: Abdomen is soft.  Musculoskeletal:        General: Normal range of motion.     Cervical back: Normal range of motion.  Neurological:     Mental Status: She is alert and oriented to person, place, and time. Mental status is at baseline.  Psychiatric:        Attention and Perception: Attention normal. She does not perceive auditory or visual hallucinations.        Mood and Affect: Mood is anxious. Mood is not depressed.        Behavior: Behavior normal.        Thought Content: Thought content normal. Thought content does not include homicidal or suicidal ideation.        Judgment: Judgment normal.     ASSESSMENT/PLAN:  Type 2 diabetes mellitus with other diabetic kidney complication (HCC) Assessment & Plan: Chronic.  Recent 9.7 A1c  NPH insulin 20  units a.m. and 15 units p.m. Humulin R sliding scale with meals. On statin and ARB therapy Followed by endocrinology.    Stage 3b chronic kidney disease ([place]) Assessment & Plan: Chronic.  Stable. Follows with nephrology   Hypertension associated with diabetes [facility]) Assessment & Plan: Chronic.  Asymptomatic.  Goal less than 140/90.  Almost at goal.  Reports compliancy with current medications Continue Bystolic 10 mg daily Continue Aldactone 25 mg daily Continue Demadex 10 mg daily Continue valsartan 160 mg daily Continue clonidine 0.2 mg 3 times daily, ideally would like to wean this medication given advanced age Continue hydralazine 100 mg 4 times daily Follow-up with cardiology as scheduled Will check blood pressure at next visit 1 week.   Hallucinations, visual Assessment &  Plan: Chronic.  Spoke with social worker prior to visit who indicated that her hallucinations started around November 2023.  No history of schizophrenia.  History depression and anxiety.  No previous evaluation by psychiatry.  Is currently open to referral.  Lives alone.  Denies any self-harm. Not currently having visual hallucinations today.  Reports demons have now turned into people and easily disappears with prayer.  Spiritual community has helped. Referral sent to psychiatry. Follow-up in 1 week.  Orders: -     Ambulatory referral to Psychiatry  Need for pneumococcal vaccination -     Pneumococcal conjugate vaccine 20-valent  Multinodular goiter Assessment & Plan: Chronic.  Recent TSH and ultrasound thyroid 01/30.  No significant change from previous ultrasound.  Recommended repeat ultrasound 2 to 3 years Follows with endocrinology as scheduled.     PDMP reviewed  Return in about 2 weeks (around 08/01/2022) for PCP.  Carollee Leitz, MD

## 2022-07-18 NOTE — Patient Outreach (Signed)
  Care Coordination   Follow Up Visit Note   07/18/2022 Name: Alexis Farmer MRN: [id number] DOB: [birth year]  Alexis Farmer is a 82 y.o. year old female who sees Carollee Leitz, MD for primary care. I spoke with  Alexis Farmer by phone today.  What matters to the patients health and wellness today?  Managing diabetes    Goals Addressed             This Visit's Progress    Patient Stated:  " Getting my diabetes under control and manage heart failure"       Care Coordination Interventions: Evaluation of current treatment plan related to diabetes / heart failure and patient's adherence to plan as established by provider:  Per chart review patient seen at office for transfer of care to new primary care provider.  Patient reports today's fasting blood sugar was 174. Denies any heart failure symptoms.  Discussed plans with patient for ongoing care management follow up:  Patient verbally agreed to next telephone outreach with [facility] on 08/13/22.  Reinforced with patient need to get scales to weigh.  Advised to contact insurance provider to determine if OTC benefits available.  Reinforced with patient need to not over treat low blood sugars.   Discussed normal blood sugar ranges and importance of not letting blood sugars get too low or too high and management/ treatment of hypo/hyperglycemia.  Reviewed medications with patient and discussed importance of medication adherence Reviewed scheduled/upcoming provider appointments            SDOH assessments and interventions completed:  No     Care Coordination Interventions:  Yes, provided   Follow up plan: Follow up call scheduled for 08/13/22    Encounter Outcome:  Pt. Visit Completed   Quinn Plowman RN,BSN,CCM [place] [phone] direct line

## 2022-07-19 NOTE — Patient Outreach (Signed)
  Care Coordination   Follow Up Visit Note   07/19/2022 Name: Alexis Farmer MRN: [id number] DOB: Dec 05, 1940  Alexis Farmer is a 82 y.o. year old female who sees Carollee Leitz, MD for primary care. I engaged with [name] in the providers office on 07/18/22.  What matters to the patients health and wellness today? Care coordination and support   Goals Addressed             This Visit's Progress    Managing visual hallucinations       Care Coordination Interventions: This Education officer, museum met with patient at providers office to follow up on visual hallucinations Patient discussed improvement in mood This Education officer, museum consulted with provider regarding patient's visual hallucination however was not able to discuss with patient due to concerns related to her transportation home following her appointment This social worker will address this with patient during next visit and will assist with coordinating mental health follow up if needed         SDOH assessments and interventions completed:  No     Care Coordination Interventions:  Yes, provided   Follow up plan: Follow up call scheduled for 08/02/22    Encounter Outcome:  Pt. Visit Completed

## 2022-07-19 NOTE — Patient Instructions (Addendum)
Visit Information  Thank you for taking time to visit with me today. Please don't hesitate to contact me if I can be of assistance to you.   Following are the goals we discussed today:   Goals Addressed             This Visit's Progress    Managing visual hallucinations       Care Coordination Interventions: This Education officer, museum met with patient at providers office to follow up on visual hallucinations Patient discussed improvement in mood This Education officer, museum consulted with provider regarding patient's visual hallucination however was not able to discuss with patient due to concerns related to her transportation home following her appointment This social worker will address this with patient during next visit and will assist with coordinating mental health follow up if needed         Our next appointment is by telephone on 08/02/22 at 11am  Please call the care guide team at [phone] if you need to cancel or reschedule your appointment.   If you are experiencing a Mental Health or [place] or need someone to talk to, please call [phone]   Patient verbalizes understanding of instructions and care plan provided today and agrees to view in [place]. Active MyChart status and patient understanding of how to access instructions and care plan via MyChart confirmed with patient.     Telephone follow up appointment with care management team member scheduled for: 08/02/22  Elliot Gurney, [place] Worker  [facility] Care Management [phone]

## 2022-07-20 ENCOUNTER — Encounter: Payer: Self-pay | Admitting: Family Medicine

## 2022-07-20 DIAGNOSIS — R441 Visual hallucinations: Secondary | ICD-10-CM | POA: Insufficient documentation

## 2022-07-20 DIAGNOSIS — Z23 Encounter for immunization: Secondary | ICD-10-CM | POA: Insufficient documentation

## 2022-07-20 NOTE — Assessment & Plan Note (Signed)
Chronic.  Recent TSH and ultrasound thyroid 01/30.  No significant change from previous ultrasound.  Recommended repeat ultrasound 2 to 3 years Follows with endocrinology as scheduled.

## 2022-07-20 NOTE — Assessment & Plan Note (Signed)
Chronic.  Recent 9.7 A1c  NPH insulin 20 units a.m. and 15 units p.m. Humulin R sliding scale with meals. On statin and ARB therapy Followed by endocrinology.

## 2022-07-20 NOTE — Assessment & Plan Note (Signed)
Chronic.  Asymptomatic.  Goal less than 140/90.  Almost at goal.  Reports compliancy with current medications Continue Bystolic 10 mg daily Continue Aldactone 25 mg daily Continue Demadex 10 mg daily Continue valsartan 160 mg daily Continue clonidine 0.2 mg 3 times daily, ideally would like to wean this medication given advanced age Continue hydralazine 100 mg 4 times daily Follow-up with cardiology as scheduled Will check blood pressure at next visit 1 week.

## 2022-07-20 NOTE — Assessment & Plan Note (Signed)
Chronic.  Stable. Follows with nephrology

## 2022-07-20 NOTE — Assessment & Plan Note (Addendum)
Chronic.  Spoke with social worker prior to visit who indicated that her hallucinations started around November 2023.  No history of schizophrenia.  History depression and anxiety.  No previous evaluation by psychiatry.  Is currently open to referral.  Lives alone.  Denies any self-harm. Not currently having visual hallucinations today.  Reports demons have now turned into people and easily disappears with prayer.  Spiritual community has helped. Referral sent to psychiatry. Follow-up in 1 week.

## 2022-08-02 ENCOUNTER — Ambulatory Visit: Payer: Self-pay | Admitting: *Deleted

## 2022-08-02 NOTE — Patient Outreach (Signed)
  Care Coordination   Initial Visit Note   08/02/2022 Name: Alexis Farmer MRN: [id number] DOB: 11-Oct-1940  Alexis Farmer is a 82 y.o. year old female who sees Alexis Leitz, MD for primary care. I spoke with  Alexis Farmer by phone today.  What matters to the patients health and wellness today?  Patient continues to report visual hallucinations, at night and early morning. Reports that she does not fear them but interrupts her peace    Goals Addressed             This Visit's Progress    Managing visual hallucinations       Care Coordination Interventions: Patient encouraged to discus visual hallucinations with primary care doctor during next visit for appropriate follow up          SDOH assessments and interventions completed:  No     Care Coordination Interventions:  Yes, provided  Interventions Today    Flowsheet Row Most Recent Value  Chronic Disease   Chronic disease during today's visit Diabetes, Hypertension (HTN)  General Interventions   General Interventions Discussed/Reviewed General Interventions Reviewed  Mental Health Interventions   Mental Health Discussed/Reviewed --  [enocuraged patient to discuss hallucinations during next visit with primary care office for referral to psychiatry]       Follow up plan: Follow up call scheduled for 08/16/22    Encounter Outcome:  Pt. Visit Completed

## 2022-08-02 NOTE — Patient Instructions (Signed)
Visit Information  Thank you for taking time to visit with me today. Please don't hesitate to contact me if I can be of assistance to you.   Following are the goals we discussed today:   Goals Addressed             This Visit's Progress    Managing visual hallucinations       Care Coordination Interventions: Patient encouraged to discus visual hallucinations with primary care doctor during next visit for appropriate follow up          Our next appointment is by telephone on 08/16/22 at 11am  Please call the care guide team at [phone] if you need to cancel or reschedule your appointment.   If you are experiencing a Mental Health or [place] or need someone to talk to, please call 911   The patient verbalized understanding of instructions, educational materials, and care plan provided today and DECLINED offer to receive copy of patient instructions, educational materials, and care plan.   Telephone follow up appointment with care management team member scheduled for: 08/16/22   Elliot Gurney, [place] Worker  [facility] Care Management [phone]

## 2022-08-13 ENCOUNTER — Ambulatory Visit: Payer: Self-pay

## 2022-08-13 NOTE — Patient Outreach (Signed)
  Care Coordination   08/13/2022 Name: Alexis Farmer MRN: [id number] DOB: [birth year]   Care Coordination Outreach Attempts:  An unsuccessful telephone outreach was attempted for a scheduled appointment today. HIPAA compliant voice message left with call back phone number.   Follow Up Plan:  Additional outreach attempts will be made to offer the patient care coordination information and services.   Encounter Outcome:  No Answer   Care Coordination Interventions:  No, not indicated    Quinn Plowman [facility] [phone] direct line

## 2022-08-14 ENCOUNTER — Encounter: Payer: Self-pay | Admitting: Podiatry

## 2022-08-14 ENCOUNTER — Ambulatory Visit (INDEPENDENT_AMBULATORY_CARE_PROVIDER_SITE_OTHER): Payer: Medicare Other | Admitting: Podiatry

## 2022-08-14 VITALS — BP 193/76

## 2022-08-14 DIAGNOSIS — B351 Tinea unguium: Secondary | ICD-10-CM

## 2022-08-14 DIAGNOSIS — I739 Peripheral vascular disease, unspecified: Secondary | ICD-10-CM

## 2022-08-14 DIAGNOSIS — E1142 Type 2 diabetes mellitus with diabetic polyneuropathy: Secondary | ICD-10-CM

## 2022-08-14 DIAGNOSIS — E119 Type 2 diabetes mellitus without complications: Secondary | ICD-10-CM | POA: Insufficient documentation

## 2022-08-14 DIAGNOSIS — M79675 Pain in left toe(s): Secondary | ICD-10-CM | POA: Diagnosis not present

## 2022-08-14 DIAGNOSIS — E890 Postprocedural hypothyroidism: Secondary | ICD-10-CM | POA: Insufficient documentation

## 2022-08-14 DIAGNOSIS — M79674 Pain in right toe(s): Secondary | ICD-10-CM

## 2022-08-14 DIAGNOSIS — E118 Type 2 diabetes mellitus with unspecified complications: Secondary | ICD-10-CM | POA: Insufficient documentation

## 2022-08-16 ENCOUNTER — Encounter: Payer: Medicare Other | Admitting: *Deleted

## 2022-08-16 NOTE — Progress Notes (Signed)
  Subjective:  Patient ID: Alexis Farmer, female    DOB: September 02, 1940,  MRN: [id number]  Alexis Farmer presents to clinic today for at risk foot care with history of diabetic neuropathy and painful elongated mycotic toenails 1-5 bilaterally which are tender when wearing enclosed shoe gear. Pain is relieved with periodic professional debridement.  Chief Complaint  Patient presents with   Nail Problem    DFC BS-241 A1C-do not know PCP-[facility] PCP VST- 07/17/2022   New problem(s): None.   PCP is Carollee Leitz, MD.  Allergies  Allergen Reactions   Celexa [Citalopram]     Diarrhea upset stomach    Jardiance [Empagliflozin] Other (See Comments)    Reaction not recalled   Norvasc [Amlodipine]     Leg edema   Tape Other (See Comments)    Leaves the skin "raw" if left on for a period of time- tolerates paper tape   Penicillin V Rash   Penicillin V Potassium Rash   Penicillins Rash    Has patient had a PCN reaction causing immediate rash, facial/tongue/throat swelling, SOB or lightheadedness with hypotension: Yes Has patient had a PCN reaction causing severe rash involving mucus membranes or skin necrosis: No Has patient had a PCN reaction that required hospitalization No Has patient had a PCN reaction occurring within the last 10 years: Yes If all of the above answers are "NO", then may proceed with Cephalosporin use.     Review of Systems: Negative except as noted in the HPI.  Objective: No changes noted in today's physical examination. Vitals:   08/14/22 1112 08/14/22 1122  BP: (!) 232/86 (!) 193/76   Alexis Farmer is a pleasant 82 y.o. female morbidly obese in NAD. AAO x 3.  Neurovascular Examination: CFT <3 seconds b/l LE. Nonpalpable DP pulse(s) b/l LE. Nonpalpable PT pulse(s) b/l LE. Pedal hair absent. No pain with calf compression b/l. Nonpitting edema noted BLE. Evidence of chronic venous insufficiency b/l LE. No cyanosis or clubbing noted b/l LE.  Protective  sensation decreased with 10 gram monofilament b/l.  Dermatological:  Pedal integument with normal turgor, texture and tone b/l LE. No open wounds b/l. No interdigital macerations b/l. Toenails 1-4 bilaterally and right 5th toe elongated, thickened, discolored with subungual debris. +Tenderness with dorsal palpation of nailplates. Anonychia noted L 5th toe. Nailbed(s) epithelialized.   Musculoskeletal:  Muscle strength 5/5 to all lower extremity muscle groups bilaterally. Pes planus deformity noted bilateral LE. Utilizes walker for ambulation assistance.  Assessment/Plan: 1. Pain due to onychomycosis of toenails of both feet   2. PAD (peripheral artery disease) ([place])   3. Diabetic peripheral neuropathy associated with type 2 diabetes mellitus ([place])     -Consent given for treatment as described below: -Examined patient. -Continue supportive shoe gear daily. -Mycotic toenails 1-5 bilaterally were debrided in length and girth with sterile nail nippers and dremel without incident. -Patient/POA to call should there be question/concern in the interim.   Return in about 3 months (around 11/12/2022).  Alexis Farmer, DPM

## 2022-08-17 ENCOUNTER — Telehealth: Payer: Self-pay | Admitting: *Deleted

## 2022-08-17 ENCOUNTER — Encounter: Payer: Self-pay | Admitting: *Deleted

## 2022-08-17 NOTE — Patient Outreach (Signed)
  Care Coordination   08/17/2022 Name: Alexis Farmer MRN: [id number] DOB: 1940-06-28   Care Coordination Outreach Attempts:  An unsuccessful telephone outreach was attempted for a scheduled appointment today.  Follow Up Plan:  Additional outreach attempts will be made to offer the patient care coordination information and services.   Encounter Outcome:  No Answer   Care Coordination Interventions:  No, not indicated    Gwynn Chalker, [place] Worker  [facility] Care Management [phone]

## 2022-08-30 ENCOUNTER — Telehealth: Payer: Self-pay

## 2022-08-30 ENCOUNTER — Telehealth: Payer: Self-pay | Admitting: *Deleted

## 2022-08-30 NOTE — Telephone Encounter (Signed)
   Telephone encounter was:  Successful.  08/30/2022 Name: Alexis Farmer MRN: [id number] DOB: Aug 26, 1940  Alexis Farmer is a 82 y.o. year old female who is a primary care patient of Carollee Leitz, MD . The community resource team was consulted for assistance with Transportation Needs   Care guide performed the following interventions: Follow up call placed to the patient to discuss status of referral.  Follow Up Plan:  No further follow up planned at this time. The patient has been provided with needed resources.

## 2022-08-30 NOTE — Telephone Encounter (Signed)
   Telephone encounter was:  Successful.  08/30/2022 Name: Dominga Mcduffie MRN: [id number] DOB: Mar 30, 1941  Shawntel Farnworth is a 82 y.o. year old female who is a primary care patient of Carollee Leitz, MD . The community resource team was consulted for assistance with Transportation Needs   Care guide performed the following interventions: Spoke with patient to confirm her ride had not been canceled and she will be picked-up at 10:45am.    Follow Up Plan:  No further follow up planned at this time. The patient has been provided with needed resources.  [place] Resource Care Guide   [email or web address]  ?? [phone]   Website: [email or web address]  [email or web address]

## 2022-08-30 NOTE — Telephone Encounter (Signed)
   Telephone encounter was:  Successful.  08/30/2022 Name: Alexis Farmer MRN: [id number] DOB: 10-Apr-1941  Alexis Farmer is a 82 y.o. year old female who is a primary care patient of Carollee Leitz, MD . The community resource team was consulted for assistance with Transportation Needs   Care guide performed the following interventions: Spoke to patient to confirm pickup time for 09/04/22.  [place] has accepted ride [phone] for C. Marland Kitchen on 09/04/2022, Arlyss Gandy Time: 10:55 AM.   Follow Up Plan:  No further follow up planned at this time. The patient has been provided with needed resources.  [place] Resource Care Guide   [email or web address]  ?? [phone]   Website: [email or web address]  [email or web address]

## 2022-08-30 NOTE — Telephone Encounter (Signed)
08/30/2022  [name] DOB: [birth year] MRN: [id number]   RIDER WAIVER AND RELEASE OF LIABILITY  For the purposes of helping with transportation needs, [place] partners with outside transportation providers (taxi companies, [place], Social research officer, government.) to give [employer] patients or other approved people the choice of on-demand rides [employer]") to our buildings for non-emergency visits.  By using [employer], I, the person signing this document, on behalf of myself and/or any legal minors (in my care using the [employer]), agree:  Government social research officer given to me are supplied by independent, outside transportation providers who do not work for, or have any affiliation with, [employer]. [place] is not a transportation company. [place] has no control over the quality or safety of the rides I get using [employer]. [place] has no control over whether any outside ride will happen on time or not. [place] gives no guarantee on the reliability, quality, safety, or availability on any rides, or that no mistakes will happen. I know and accept that traveling by vehicle (car, truck, SVU, Lucianne Lei, bus, taxi, etc.) has risks of serious injuries such as disability, being paralyzed, and death. I know and agree the risk of using [employer] is mine alone, and not [employer]. Transport Services are provided "as is" and as are available. The transportation providers are in charge for all inspections and care of the vehicles used to provide these rides. I agree not to take legal action against [place], its agents, employees, officers, directors, representatives, insurers, attorneys, assigns, successors, subsidiaries, and affiliates at any time for any reasons related directly or indirectly to using [employer]. I also agree not to take legal action against [place] or its affiliates for any injury, death, or damage to property caused by or related to using  [employer]. I have read this Waiver and Release of Liability, and I understand the terms used in it and their legal meaning. This Waiver is freely and voluntarily given with the understanding that my right (or any legal minors) to legal action against [place] relating to [employer] is knowingly given up to use these services.   I attest that I read the Ride Waiver and Release of Liability to [name], gave Ms. Noa the opportunity to ask questions and answered the questions asked (if any). I affirm that [name] then provided consent for assistance with transportation.     Siena Poehler D Tuck [place]                                Telephone encounter was:  Successful.  08/30/2022 Name: Karigan Mahlke MRN: [id number] DOB: 02/03/1941  Allexandra Bertling is a 82 y.o. year old female who is a primary care patient of Carollee Leitz, MD . The community resource team was consulted for assistance with Transportation Needs   Care guide performed the following interventions: Spoke to patient to confirm transportation request received on the [place] line for 09/04/22. Sending request to Dellie Catholic to arrange transportation.    Follow Up Plan: I will call the patient once I receive confirmation.  [place] Resource Care Guide   [email or web address]  ?? [phone]   Website: [email or web address]  [email or web address]

## 2022-08-31 ENCOUNTER — Ambulatory Visit: Payer: Self-pay

## 2022-08-31 NOTE — Patient Outreach (Unsigned)
  Care Coordination   Follow Up Visit Note   08/31/2022 Name: Alexis Farmer MRN: [id number] DOB: [birth year]  Alexis Farmer is a 82 y.o. year old female who sees Carollee Leitz, MD for primary care. I spoke with  Alexis Farmer by phone today.  What matters to the patients health and wellness today?  Patient states her blood sugars range from 69-260's. She states she knows how to treat for low blood sugars but states she over corrects when treating. Patient states she knows she doesn't eat the best because sometimes the food doesn't taste good. She states she eats later in the morning and may only have taste for a peanut butter cracker or something small.  She states she doesn't feel like cooking most of the time.  Patient reports having meals on wheels in the past but did not like the food.    Goals Addressed             This Visit's Progress    Patient Stated:  " Getting my diabetes under control and manage heart failure"       Interventions Today    Flowsheet Row Most Recent Value  Chronic Disease   Chronic disease during today's visit Diabetes  General Interventions   General Interventions Discussed/Reviewed General Interventions Reviewed  [assessed for patients home blood sugar readings. Advised to continue monitoring blood sugars and recording.  Advised to notify provider for frequent low blood sugars.]  Education Interventions   Education Provided Provided Education  Provided Verbal Education On Blood Sugar Monitoring  [Discussed need to not over correct for low blood sugar readings.   Re-discussed 15-15 rule for hypoglycemic treatment.]  Mental Health Interventions   Mental Health Discussed/Reviewed Other  [active listening and emotional support provided.]  Nutrition Interventions   Nutrition Discussed/Reviewed Nutrition Reviewed  [Advised to not skip meals and eat more balanced meals daily. Offered referral for meals on wheels.]  Pharmacy Interventions   Pharmacy  Dicussed/Reviewed Pharmacy Topics Reviewed  [medications reviewed and compliance discussed.]  Safety Interventions   Safety Discussed/Reviewed Home Safety  [Inquired if patient has family and or friends/ neighbors to call on if assistance is needed.]                 SDOH assessments and interventions completed:  No     Care Coordination Interventions:  Yes, provided   Follow up plan: Follow up call scheduled for 09/24/22    Encounter Outcome:  Pt. Visit Completed   Quinn Plowman RN,BSN,CCM [place] [phone] direct line

## 2022-09-03 NOTE — Progress Notes (Signed)
SUBJECTIVE:   Chief Complaint  Patient presents with   Medical Management of Chronic Issues    Follow up/ cough for a while/weak   HPI Patient presents to clinic for follow-up as hallucinations.  Hallucinations Reports has not had any further hallucinations since last visit.  Not interested in continuing with psychiatric referral.  Reports "demons leave when she spills the blood".  Denies any auditory hallucinations.  Reports demons do bother her any longer and feels that a higher power has shown her this for some reason.  Recently had eye exam and was notable for floaters.  Denies any memory loss, headaches, weakness, SI/HI.  She is independent and lives alone.  Manages her own finances.  Diabetes type 2 Follows with endocrinology.  Recent A1c 9.7.  Currently on Lantus 20 units daily and 15 units at night.  Sliding scale insulin 3 times daily with meals.  Compliant with medication.  Hypertension Asymptomatic.  Managed by renal and cardiology.  Currently on clonidine 0.2 mg 3 times daily, hydralazine 100 mg every 6 hours, Bystolic 10 mg daily, spironolactone 25 mg daily, torsemide 10 mg daily and valsartan 160 mg daily.    PERTINENT PMH / PSH: Diabetes type 2 Hypertension Mood disorder   OBJECTIVE:  BP (!) 150/68   Pulse 73   Temp 99.1 F (37.3 C) (Oral)   Ht 5\' 3"  (1.6 m)   Wt 245 lb 12.8 oz (111.5 kg)   SpO2 97%   BMI 43.54 kg/m    Physical Exam Constitutional:      General: She is not in acute distress.    Appearance: She is normal weight. She is not ill-appearing.  HENT:     Head: Normocephalic.     Right Ear: There is impacted cerumen.  Eyes:     Conjunctiva/sclera: Conjunctivae normal.  Cardiovascular:     Rate and Rhythm: Normal rate and regular rhythm.     Pulses: Normal pulses.  Pulmonary:     Effort: Pulmonary effort is normal.     Breath sounds: Normal breath sounds.  Abdominal:     General: Bowel sounds are normal.  Neurological:     Mental  Status: She is alert. Mental status is at baseline.  Psychiatric:        Mood and Affect: Mood normal.        Behavior: Behavior normal.        Thought Content: Thought content normal.        Judgment: Judgment normal.     ASSESSMENT/PLAN:  Hallucinations, visual Assessment & Plan: Chronic.  Has not had hallucinations since last visit.  Does not want to continue with referral to psychiatry. Does not want medications and feels that can manage and around.  Denies any depressive mood.  Denies any SI/HI. Encouraged evaluation by psychiatry   Hypertension associated with diabetes [facility]) Assessment & Plan: Chronic.  Asymptomatic.  Elevated blood pressure today.  Repeat remains elevated. Managed by renal and cardiology Continue Bystolic 10 mg daily Continue Aldactone 25 mg daily Continue Demadex 10 mg daily Continue valsartan 160 mg daily Continue clonidine 0.2 mg 3 times daily Continue hydralazine 100 mg 4 times daily Follow-up with cardiology and nephrology as scheduled    DM type 2, controlled, with complication ([place]) Assessment & Plan: Chronic.  Recent A1c 9.7.  Uncontrolled.  Compliant with medications. Follow-up with endocrinology as scheduled.   Ceruminosis, right Assessment & Plan: Chronic. Verbal consent provided for ear syringing. CMA flushed right ear Procedure tolerated well.  Instructions provided for care procedure and follow-up.    PDMP reviewed  Return in about 6 months (around 03/07/2023), or if symptoms worsen or fail to improve.  Carollee Leitz, MD

## 2022-09-03 NOTE — Patient Instructions (Incomplete)
It was a pleasure meeting you today. Thank you for allowing me to take part in your health care.  Our goals for today as we discussed include:  Right ear flushed If you develop any discomfort over the next few days can take Tylenol 325 mg every 6 hours.  Sometimes can develop ear infection.  Monitor for any pain, fever or discharge. Notify MD if worsening symptoms.   Recommend to follow up with Psychiatry if you continue to have any hallucinations.    Your blood pressure remains elevated.  Please follow up with cardiology and nephrology.  Follow up with Endocrinology for your Diabetes  Recommend Tetanus Vaccination.  This is given every 10 years.   If you have any questions or concerns, please do not hesitate to call the office at [phone].  I look forward to our next visit and until then take care and stay safe.  Regards,   Carollee Leitz, MD   [facility]

## 2022-09-04 ENCOUNTER — Ambulatory Visit (INDEPENDENT_AMBULATORY_CARE_PROVIDER_SITE_OTHER): Payer: Medicare Other | Admitting: Family Medicine

## 2022-09-04 VITALS — BP 150/68 | HR 73 | Temp 99.1°F | Ht 63.0 in | Wt 245.8 lb

## 2022-09-04 DIAGNOSIS — E118 Type 2 diabetes mellitus with unspecified complications: Secondary | ICD-10-CM | POA: Diagnosis not present

## 2022-09-04 DIAGNOSIS — E1159 Type 2 diabetes mellitus with other circulatory complications: Secondary | ICD-10-CM

## 2022-09-04 DIAGNOSIS — H6121 Impacted cerumen, right ear: Secondary | ICD-10-CM

## 2022-09-04 DIAGNOSIS — R441 Visual hallucinations: Secondary | ICD-10-CM | POA: Diagnosis not present

## 2022-09-04 DIAGNOSIS — I152 Hypertension secondary to endocrine disorders: Secondary | ICD-10-CM

## 2022-09-06 ENCOUNTER — Telehealth: Payer: Self-pay | Admitting: Family Medicine

## 2022-09-06 ENCOUNTER — Other Ambulatory Visit: Payer: Self-pay

## 2022-09-06 DIAGNOSIS — E1165 Type 2 diabetes mellitus with hyperglycemia: Secondary | ICD-10-CM

## 2022-09-06 MED ORDER — ACCU-CHEK GUIDE VI STRP: ORAL_STRIP | 12 refills | Status: DC

## 2022-09-06 NOTE — Telephone Encounter (Signed)
Patient was seen by Dr Volanda Napoleon and patient states that Dr Volanda Napoleon did not refill her test stripes. She states she only has 2 remaining.

## 2022-09-06 NOTE — Telephone Encounter (Signed)
Strips sent to pharmacy.  Ranen Doolin,cma  

## 2022-09-07 ENCOUNTER — Encounter: Payer: Self-pay | Admitting: Family Medicine

## 2022-09-07 ENCOUNTER — Other Ambulatory Visit: Payer: Self-pay | Admitting: Cardiovascular Disease

## 2022-09-07 DIAGNOSIS — I6523 Occlusion and stenosis of bilateral carotid arteries: Secondary | ICD-10-CM

## 2022-09-07 DIAGNOSIS — R002 Palpitations: Secondary | ICD-10-CM

## 2022-09-07 DIAGNOSIS — H6121 Impacted cerumen, right ear: Secondary | ICD-10-CM | POA: Insufficient documentation

## 2022-09-07 DIAGNOSIS — I10 Essential (primary) hypertension: Secondary | ICD-10-CM

## 2022-09-07 NOTE — Assessment & Plan Note (Signed)
>>  ASSESSMENT AND PLAN FOR TYPE 2 DIABETES MELLITUS WITHOUT COMPLICATIONS (HCC) WRITTEN ON 09/07/2022  6:36 PM BY WALSH, TANYA, MD  Chronic.  Recent A1c 9.7.  Uncontrolled.  Compliant with medications. Follow-up with endocrinology as scheduled.

## 2022-09-07 NOTE — Assessment & Plan Note (Signed)
Chronic.  Asymptomatic.  Elevated blood pressure today.  Repeat remains elevated. Managed by renal and cardiology Continue Bystolic 10 mg daily Continue Aldactone 25 mg daily Continue Demadex 10 mg daily Continue valsartan 160 mg daily Continue clonidine 0.2 mg 3 times daily Continue hydralazine 100 mg 4 times daily Follow-up with cardiology and nephrology as scheduled

## 2022-09-07 NOTE — Assessment & Plan Note (Signed)
Chronic. Verbal consent provided for ear syringing. CMA flushed right ear Procedure tolerated well. Instructions provided for care procedure and follow-up.

## 2022-09-07 NOTE — Assessment & Plan Note (Signed)
Chronic.  Recent A1c 9.7.  Uncontrolled.  Compliant with medications. Follow-up with endocrinology as scheduled.

## 2022-09-07 NOTE — Assessment & Plan Note (Signed)
Chronic.  Has not had hallucinations since last visit.  Does not want to continue with referral to psychiatry. Does not want medications and feels that can manage and around.  Denies any depressive mood.  Denies any SI/HI. Encouraged evaluation by psychiatry

## 2022-09-17 ENCOUNTER — Telehealth: Payer: Self-pay | Admitting: Cardiovascular Disease

## 2022-09-17 MED ORDER — VALSARTAN 160 MG PO TABS: 160.0000 mg | ORAL_TABLET | Freq: Every day | ORAL | 0 refills | Status: DC

## 2022-09-17 NOTE — Telephone Encounter (Signed)
*  STAT* If patient is at the pharmacy, call can be transferred to refill team.   1. Which medications need to be refilled? (please list name of each medication and dose if known)  valsartan (DIOVAN) 160 MG tablet  2. Which pharmacy/location (including street and city if local pharmacy) is medication to be sent to? [id number] - [place], [place]    3. Do they need a 30 day or 90 day supply? 90 day supply  Patient states she has been out of medication for 3 days

## 2022-09-24 ENCOUNTER — Ambulatory Visit: Payer: Self-pay

## 2022-09-24 NOTE — Patient Outreach (Signed)
  Care Coordination   Follow Up Visit Note   09/24/2022 Name: Alexis Farmer MRN: [id number] DOB: May 09, 1941  Alexis Farmer is a 82 y.o. year old female who sees Alexis Allan, MD for primary care. I spoke with  Alexis Farmer by phone today.  What matters to the patients health and wellness today?  Patient states she does not want to have psychiatric referral related to hallucinations/ visions she expresses having. Patient states she doesn't feels these are hallucinations. Patient reports blood sugar are ranging from 160-low 300's.  She states  the high blood sugars in the 300's is a result of her treating a low.  Patient reports having approximately 2 low blood sugars per week during the night. Patient states she doesn't always have much of an appetite and she doesn't really want to cook. She states she snacks on occasional throughout the day. She does report eating meals on occasion. Patient states she may eat hard boiled eggs or peanut butter cracker. She reports a meal may be a meat/ protein and vegetable.  Patient states she does not taking her fast acting medications if she knows she is not going to meet. Patient states her provider is aware of this.    Goals Addressed             This Visit's Progress    Patient Stated:  " Getting my diabetes under control and manage heart failure"       Interventions Today    Flowsheet Row Most Recent Value  Chronic Disease   Chronic disease during today's visit Diabetes, Other  [hallucinations]  General Interventions   General Interventions Discussed/Reviewed General Interventions Reviewed, Doctor Visits  [evaluation of current treatment plan for diabetes , hallucinations and patients adherence to plan as recommended by provider. Assessed home monitored blood sugars. Assessed for hypo/hyperglycemic symptoms. Discussed ongoing follow up with RNCM.]  Doctor Visits Discussed/Reviewed Doctor Visits Reviewed  Alexis Farmer most recent primary provider  visit. Reviewed scheduled/ upcoming provider visits]  Education Interventions   Education Provided Provided Education  Provided Verbal Education On Blood Sugar Monitoring  [Assessed for consistent blood sugar monitoring and treatment for hypo/ hyperglycemia events.]  Mental Health Interventions   Mental Health Discussed/Reviewed Mental Health Reviewed, Other  [Assessed for ongoing hallucinations.  Rediscussed and re-offered psychiatric referral. Active listening and support.]  Nutrition Interventions   Nutrition Discussed/Reviewed Nutrition Reviewed  [Assessed patients daily meals/ snacks.  Discussed with patient easy to prepare and balanced food options.  Encouraged eating healthy snack prior to bed to assist with combating low blood sugars during the night.]  Pharmacy Interventions   Pharmacy Dicussed/Reviewed Pharmacy Topics Reviewed  [Medications reviewed and compliance discussed. Advised to notify provider of self medication changes. Encouraged patient to take insulin as recommended by endocrinologist.]                 SDOH assessments and interventions completed:  No     Care Coordination Interventions:  Yes, provided   Follow up plan: Follow up call scheduled for 10/30/22    Encounter Outcome:  Pt. Visit Completed   Alexis Ina RN,BSN,CCM [facility] Care Coordination [phone] direct line

## 2022-09-26 ENCOUNTER — Telehealth: Payer: Self-pay

## 2022-09-26 ENCOUNTER — Telehealth: Payer: Self-pay | Admitting: *Deleted

## 2022-09-26 NOTE — Patient Outreach (Signed)
  Care Coordination   09/26/2022 Name: Jenniferanne Reda MRN: [id number] DOB: 02-04-1941   Care Coordination Outreach Attempts:  An unsuccessful telephone outreach was attempted today to offer the patient information about available care coordination services as a benefit of their health plan.   Follow Up Plan:  Additional outreach attempts will be made to offer the patient care coordination information and services.   Encounter Outcome:  No Answer   Care Coordination Interventions:  No, not indicated    Lukisha Procida, LCSW Clinical Social Worker  [facility] Care Management [phone]

## 2022-09-26 NOTE — Patient Outreach (Unsigned)
  Care Coordination   Multidisciplinary Case Review Note    09/27/2022 Name: Alexis Farmer MRN: [id number] DOB: Aug 20, 1940  Alexis Farmer is a 82 y.o. year old female who sees [name], MD for primary care.  The  multidisciplinary care team met today to review patient care needs and barriers.     Goals Addressed             This Visit's Progress    Patient Stated:  " Getting my diabetes under control and manage heart failure"       Interventions Today    Flowsheet Row Most Recent Value  Chronic Disease   Chronic disease during today's visit Diabetes  [multidisciplinary team met to discuss patients barriers / needs]                 SDOH assessments and interventions completed:  No     Care Coordination Interventions Activated:  Yes   Care Coordination Interventions:  Yes, provided   Follow up plan: Follow up call scheduled for as previously scheduled    Multidisciplinary Team Attendees:   Kemper Durie RN, CCM [place] [address] MSW [place], BSW [place], RN [name] RN, BSN, CCM  [name] RN,BSN,CCM [facility] Care Coordination [phone] direct line

## 2022-10-08 ENCOUNTER — Telehealth: Payer: Self-pay

## 2022-10-08 NOTE — Telephone Encounter (Signed)
Telephone encounter was:  Successful.  10/08/2022 Name: Alexis Farmer MRN: [id number] DOB: Feb 11, 1941  Alexis Farmer is a 82 y.o. year old female who is a primary care patient of [name], MD . The community resource team was consulted for assistance with Transportation Needs   Care guide performed the following interventions: Patient provided with information about care guide support team and interviewed to confirm resource needs. Patient needs transportation 4/30 and is set up for her pick up at 9:45 and 12:00 for home trip  Alexis Farmer DOB: 1941-04-03 MRN: [id number]   RIDER WAIVER AND RELEASE OF LIABILITY  For purposes of improving physical access to our facilities, [place] [facility] is pleased to partner with third parties to provide [place] patients or other authorized individuals the option of convenient, on-demand ground transportation services (the [employer]") through use of the technology service that enables users to request on-demand ground transportation from independent third-party providers.  By opting to use and accept these [employer], I, the undersigned, hereby agree on behalf of myself, and on behalf of any minor child using the Science writer for whom I am the parent or legal guardian, as follows:  Science writer provided to me are provided by independent third-party transportation providers who are not [employer] or employees and who are unaffiliated with [employer]. [place] is neither a transportation carrier nor a common or public carrier. [facility] has no control over the quality or safety of the transportation that occurs as a result of the [employer]. [employer] cannot guarantee that any third-party transportation provider will complete any arranged transportation service. [facility] makes no representation, warranty, or guarantee regarding the reliability, timeliness, quality, safety, suitability, or  availability of any of the Transport Services or that they will be error free. I fully understand that traveling by vehicle involves risks and dangers of serious bodily injury, including permanent disability, paralysis, and death. I agree, on behalf of myself and on behalf of any minor child using the Transport Services for whom I am the parent or legal guardian, that the entire risk arising out of my use of the [employer] remains solely with me, to the maximum extent permitted under applicable law. The [employer] are provided "as is" and "as available." [place] [facility] disclaims all representations and warranties, express, implied or statutory, not expressly set out in these terms, including the implied warranties of merchantability and fitness for a particular purpose. I hereby waive and release [place], its agents, employees, officers, directors, representatives, insurers, attorneys, assigns, successors, subsidiaries, and affiliates from any and all past, present, or future claims, demands, liabilities, actions, causes of action, or suits of any kind directly or indirectly arising from acceptance and use of the [employer]. I further waive and release [place] and its affiliates from all present and future liability and responsibility for any injury or death to persons or damages to property caused by or related to the use of the [employer]. I have read this Waiver and Release of Liability, and I understand the terms used in it and their legal significance. This Waiver is freely and voluntarily given with the understanding that my right (as well as the right of any minor child for whom I am the parent or legal guardian using the [employer]) to legal recourse against  in connection with the [employer] is knowingly surrendered in return for use of these services.   I attest that I read  the consent document to [name], gave Ms. Neidlinger  the opportunity to ask questions and answered the questions asked (if any). I affirm that [name] then provided consent for she's participation in this program.     Deri Fuelling                              Follow Up Plan:  No further follow up planned at this time. The patient has been provided with needed resources.    Lenard Forth [facility] Guide, [facility] [phone] [address] [place], [place], [place] [address] Phone: [phone] Email: [email or web address]

## 2022-10-10 ENCOUNTER — Inpatient Hospital Stay: Payer: Medicare Other | Attending: Oncology

## 2022-10-10 DIAGNOSIS — D472 Monoclonal gammopathy: Secondary | ICD-10-CM | POA: Insufficient documentation

## 2022-10-10 DIAGNOSIS — D72820 Lymphocytosis (symptomatic): Secondary | ICD-10-CM

## 2022-10-10 LAB — CBC WITH DIFFERENTIAL/PLATELET
Abs Immature Granulocytes: 0.01 10*3/uL (ref 0.00–0.07)
Basophils Absolute: 0.1 10*3/uL (ref 0.0–0.1)
Basophils Relative: 1 %
Eosinophils Absolute: 0.1 10*3/uL (ref 0.0–0.5)
Eosinophils Relative: 1 %
HCT: 37.9 % (ref 36.0–46.0)
Hemoglobin: 12.2 g/dL (ref 12.0–15.0)
Immature Granulocytes: 0 %
Lymphocytes Relative: 55 %
Lymphs Abs: 4 10*3/uL (ref 0.7–4.0)
MCH: 28.9 pg (ref 26.0–34.0)
MCHC: 32.2 g/dL (ref 30.0–36.0)
MCV: 89.8 fL (ref 80.0–100.0)
Monocytes Absolute: 0.6 10*3/uL (ref 0.1–1.0)
Monocytes Relative: 8 %
Neutro Abs: 2.6 10*3/uL (ref 1.7–7.7)
Neutrophils Relative %: 35 %
Platelets: 237 10*3/uL (ref 150–400)
RBC: 4.22 MIL/uL (ref 3.87–5.11)
RDW: 13.2 % (ref 11.5–15.5)
WBC: 7.3 10*3/uL (ref 4.0–10.5)
nRBC: 0 % (ref 0.0–0.2)

## 2022-10-10 LAB — COMPREHENSIVE METABOLIC PANEL
ALT: 24 U/L (ref 0–44)
AST: 31 U/L (ref 15–41)
Albumin: 4 g/dL (ref 3.5–5.0)
Alkaline Phosphatase: 40 U/L (ref 38–126)
Anion gap: 7 (ref 5–15)
BUN: 31 mg/dL — ABNORMAL HIGH (ref 8–23)
CO2: 25 mmol/L (ref 22–32)
Calcium: 9.9 mg/dL (ref 8.9–10.3)
Chloride: 100 mmol/L (ref 98–111)
Creatinine, Ser: 1.97 mg/dL — ABNORMAL HIGH (ref 0.44–1.00)
GFR, Estimated: 25 mL/min — ABNORMAL LOW (ref >60)
Glucose, Bld: 222 mg/dL — ABNORMAL HIGH (ref 70–99)
Potassium: 4.6 mmol/L (ref 3.5–5.1)
Sodium: 132 mmol/L — ABNORMAL LOW (ref 135–145)
Total Bilirubin: 0.6 mg/dL (ref 0.3–1.2)
Total Protein: 7.5 g/dL (ref 6.5–8.1)

## 2022-10-10 LAB — LACTATE DEHYDROGENASE: LDH: 171 U/L (ref 98–192)

## 2022-10-11 ENCOUNTER — Telehealth: Payer: Self-pay

## 2022-10-11 ENCOUNTER — Telehealth: Payer: Self-pay | Admitting: Family Medicine

## 2022-10-11 ENCOUNTER — Other Ambulatory Visit: Payer: Self-pay | Admitting: *Deleted

## 2022-10-11 ENCOUNTER — Telehealth: Payer: Self-pay | Admitting: Cardiovascular Disease

## 2022-10-11 DIAGNOSIS — R269 Unspecified abnormalities of gait and mobility: Secondary | ICD-10-CM

## 2022-10-11 NOTE — Patient Outreach (Addendum)
  Care Coordination   Follow Up Visit Note   10/11/2022 Late entry 10/09/22 Name: Alexis Farmer MRN: [id number] DOB: [birth year]  Alexis Farmer is a 82 y.o. year old female who sees [name], MD for primary care. I spoke with  [name] by phone today.  What matters to the patients health and wellness today?  Patient called tearful regarding treatment she received from [facility] transit service.  Patient reports calling transport service to schedule ride for  her 10/16/22 provider appointment. She states her ride request was picked up by [employer] transit.  She states she attempted today 10/09/22 to report the unpleasant experiences she had from previous 2 rides with [place] transportation and was told by person she spoke with there was nothing they could do.  Patient states she had transport with [facility] in January 2024 and another transport in March 2024.  She states on these two occasions she called  to follow up regarding pick up time due to the long wait. She states the gentleman answering the phone " hollered at her stating you just have to wait."  Patient states on one occasion it took so long for [place] transit to pick her up from her doctors appointment that she had to call her granddaughter from her job to pick her up.  Patient states she is very upset about how she has been dealt with / spoken to. She also voices concerns about the safety of the lift on the [employer] that transports with this company.    Goals Addressed             This Visit's Progress    Community resources       Interventions Today    Flowsheet Row Most Recent Value  Chronic Disease   Chronic disease during today's visit Diabetes  General Interventions   General Interventions Discussed/Reviewed Education officer, community of patients concerns regarding treatment she experienced from transportation carrier.]              SDOH assessments and interventions completed:  No      Care Coordination Interventions:  Yes, provided   Follow up plan: Follow up call scheduled for as previously scheduled    Encounter Outcome:  Pt. Visit Completed   [name] RN,BSN,CCM [facility] Care Coordination [phone] direct line

## 2022-10-11 NOTE — Telephone Encounter (Signed)
Pt c/o BP issue: STAT if pt c/o blurred vision, one-sided weakness or slurred speech  1. What are your last 5 BP readings? 191/79; 65  2. Are you having any other symptoms (ex. Dizziness, headache, blurred vision, passed out)? No   3. What is your BP issue? Patient is experiencing elevated BP. Seems like patient has slurred speech

## 2022-10-11 NOTE — Telephone Encounter (Signed)
Pt called stating she want therapy services because she is getting stiff and want to do exercise

## 2022-10-11 NOTE — Telephone Encounter (Signed)
Referral placed.

## 2022-10-11 NOTE — Telephone Encounter (Signed)
The patient called in with concerns of anxiety and elevated blood pressures. She stated that she was calling to see if Dr. Mariah Milling could prescribe her some medication for anxiety. She has been advised to call her PCP with those concerns. Time was spent talking to the patient about ways to relax and to think about activities that might help relieve the stress. She stated that her granddaughter was coming this weekend and that normally helped. She will call her PCP to discuss medication.  Her blood pressure today was 191/79 and heart rate was 65. She has been advised that stress and anxiety can raise her blood pressure.   Current Medications: Clonidine 0.2 mg bid Hydralazine 100 mg four times daily Nebivolol 10 mg once daily  Spironolactone 25 mg once daily Torsemide 10 mg once daily Valsartan 160 mg once daily   She stated that she did call EMS for her pressures a few days ago and they did advised her to go to the hospital but she declined. She stated that if it does not go down after her Hydralazine at 1 pm then she will call and EMS again to take her to the ED.

## 2022-10-11 NOTE — Telephone Encounter (Signed)
Pt requested PT to her improve her walking. She states that she has done this in the past & they cam out and did an initial evaluation.   Pt needs a company that will come to her home. She also would like to try someone other than she used in the past if possible. She can't remember the name of the company, but said it should be in her chart from when Dr. [employer] ordered it before.

## 2022-10-11 NOTE — Patient Outreach (Signed)
  Care Coordination   10/11/2022 Name: Alexis Farmer MRN: [id number] DOB: 07/25/1940   Care Coordination Outreach Attempts:  An unsuccessful telephone outreach was attempted today to offer the patient information about available care coordination services as a benefit of their health plan.   HIPAA compliant message left with call back phone number.   Follow Up Plan:  Additional outreach attempts will be made to offer the patient care coordination information and services.   Encounter Outcome:  No Answer   Care Coordination Interventions:  No, not indicated    [name] [facility], [place] [facility] Care Coordination [phone] direct line

## 2022-10-12 ENCOUNTER — Ambulatory Visit: Payer: Self-pay | Admitting: *Deleted

## 2022-10-12 NOTE — Patient Outreach (Unsigned)
  Care Coordination   Follow Up Visit Note   10/12/2022 Late entry 10/11/22 Name: Alexis Farmer MRN: [id number] DOB: 08/04/1940  Alexis Farmer is a 82 y.o. year old female who sees Alexis Allan, MD for primary care. I spoke with  Alexis Farmer by phone today.  What matters to the patients health and wellness today?  Patient verbalized appreciation for assistance with her transportation concerns. Patient verbalized agreement for follow up appointment with social worker.     Goals Addressed   None     SDOH assessments and interventions completed:  No{THN Tip this will not be part of the note when signed-REQUIRED REPORT FIELD DO NOT DELETE (Optional):27901}     Care Coordination Interventions:  Yes, provided {THN Tip this will not be part of the note when signed-REQUIRED REPORT FIELD DO NOT DELETE (Optional):27901}  Follow up plan: Follow up call scheduled for as previously scheduled    Encounter Outcome:  Pt. Visit Completed {THN Tip this will not be part of the note when signed-REQUIRED REPORT FIELD DO NOT DELETE (Optional):27901}  Alexis Ina RN,BSN,CCM [facility] Care Coordination [phone] direct line

## 2022-10-12 NOTE — Patient Outreach (Signed)
  Care Coordination   Follow Up Visit Note   10/12/2022 Name: Alexis Farmer MRN: [id number] DOB: [birth year]  Alexis Farmer is a 82 y.o. year old female who sees [name], MD for primary care. I spoke with  [name] by phone today.  What matters to the patients health and wellness today?  Patient concerned about the transportation options to medical appointments. Alternatives discussed including applying again for [employer]. Per patient, she had applied before in the past and found the process difficult to navigate. Patient reluctant to re-apply. Discussed plan to reach out to family for assistance. She also willing to consider ongoing mental health treatment following her follow up appointment with oncology on 10/16/22.   Goals Addressed             This Visit's Progress    Managing visual hallucinations       Care Coordination Interventions: Continued encouragement to consider ongoing mental health follow up       transportation options       Care Coordination Interventions: Alternative transportation options discussed, including [employer] and reaching out to family for possible support           SDOH assessments and interventions completed:  No     Care Coordination Interventions:  Yes, provided  Interventions Today    Flowsheet Row Most Recent Value  Chronic Disease   Chronic disease during today's visit Diabetes  General Interventions   General Interventions Discussed/Reviewed [employer] concerns discussed, alternatives discussed([employer] family/friends)]  Mental Health Interventions   Mental Health Discussed/Reviewed Mental Health Reviewed  [Will continue to consider but is more interested in lab results from 4/24-follow up scheduled for 4/30 to be reviewed by the cancer center-emotional support provided]       Follow up plan: Follow up call scheduled for 10/19/22    Encounter Outcome:  Pt. Visit Completed

## 2022-10-12 NOTE — Patient Instructions (Signed)
Visit Information  Thank you for taking time to visit with me today. Please don't hesitate to contact me if I can be of assistance to you.   Following are the goals we discussed today:   Goals Addressed             This Visit's Progress    Managing visual hallucinations       Care Coordination Interventions: Continued encouragement to consider ongoing mental health follow up       transportation options       Care Coordination Interventions: Alternative transportation options discussed, including [employer] and reaching out to family for possible support           Our next appointment is by telephone on 10/19/22 at 1pm  Please call the care guide team at [phone] if you need to cancel or reschedule your appointment.   If you are experiencing a Mental Health or Behavioral Health Crisis or need someone to talk to, please call the Suicide and Crisis Lifeline: 988   Patient verbalizes understanding of instructions and care plan provided today and agrees to view in MyChart. Active MyChart status and patient understanding of how to access instructions and care plan via MyChart confirmed with patient.     Telephone follow up appointment with care management team member scheduled for: 10/19/22  Verna Czech, LCSW Clinical Social Worker  [facility] Care Management [phone]

## 2022-10-14 LAB — COMP PANEL: LEUKEMIA/LYMPHOMA: Immunophenotypic Profile: 16

## 2022-10-15 NOTE — Telephone Encounter (Signed)
Spoke to patient and informed her of Dr. Windell Hummingbird recommendations as follows:  "If blood pressure runs consistently elevated, she could increase clonidine up to 0.2 mg 3 times a day/every 8 hours Dose with breakfast in the morning, dose at 2 or 3 PM and one before bed Thx TGollan"  Patient understood with read back

## 2022-10-16 ENCOUNTER — Telehealth: Payer: Self-pay | Admitting: *Deleted

## 2022-10-16 ENCOUNTER — Telehealth: Payer: Self-pay

## 2022-10-16 ENCOUNTER — Inpatient Hospital Stay: Payer: Medicare Other | Admitting: Oncology

## 2022-10-16 NOTE — Patient Outreach (Signed)
  Care Coordination   Follow Up Visit Note   10/16/2022 Name: Vallie Fayette MRN: [id number] DOB: 1940-11-08  Alexis Farmer is a 82 y.o. year old female who sees [name], MD for primary care. I spoke with  [name] by phone today.  What matters to the patients health and wellness today?  Returned telephone call to patient.  Patient states the assigned transportation company [employer] did not pick her up for her 10:30 am provider appointment. Patient states she received a message from [employer] stating she would be picked up at 9:45 am or later.  She states she waiting until 10:30 am then had her granddaughter call the provider office to cancel appointment.     Goals Addressed             This Visit's Progress    Community resources       Interventions Today    Flowsheet Row Most Recent Value  Chronic Disease   Chronic disease during today's visit Diabetes  General Interventions   General Interventions Discussed/Reviewed General Interventions Reviewed  [facility] sent to community resource guide regarding patient not being picked up by assigned transportation carrier for provider appointment.]              SDOH assessments and interventions completed:  No     Care Coordination Interventions:  Yes, provided   Follow up plan: Follow up call scheduled for as previously scheduled     Encounter Outcome:  Pt. Visit Completed   [name] RN,BSN,CCM [facility] Care Coordination [phone] direct line

## 2022-10-16 NOTE — Patient Outreach (Signed)
  Care Coordination   10/16/2022 Name: Alexis Farmer MRN: [id number] DOB: 02-12-1941   Care Coordination Outreach Attempts:  An unsuccessful telephone outreach was attempted for a scheduled appointment today. HIPAA compliant message left with call back phone number and return call request.   Follow Up Plan:  Additional outreach attempts will be made to offer the patient care coordination information and services.   Encounter Outcome:  No Answer   Care Coordination Interventions:  No, not indicated    [name] [facility] Care Coordination [phone] direct line

## 2022-10-16 NOTE — Telephone Encounter (Signed)
   Telephone encounter was:  Successful.  10/16/2022 Name: Alexis Farmer MRN: [id number] DOB: 01/22/1941  Alexis Farmer is a 82 y.o. year old female who is a primary care patient of [name], MD . The community resource team was consulted for assistance with Transportation Needs   Care guide performed the following interventions: Patient provided with information about care guide support team and interviewed to confirm resource needs. patient reached out on [employer] line and asked about missed ride as [employer] did not pick her up there is not any updated information in [place] regarding the ride , patient expressed gradndaughter will take to missed appt at a later day.  Follow Up Plan:  No further follow up planned at this time. The patient has been provided with needed resources. Yehuda Mao Greenauer -[facility] [place], Population Health [phone] [address] [place] , [place] [address] Email : [email or web address]

## 2022-10-16 NOTE — Assessment & Plan Note (Deleted)
#  Monoclonal lymphocytosis of unknown significance.  Labs are reviewed and discussed with patient. 12/26/21 peripheral blood flowcytometry showed CD5 positive monoclonal B cell lymphocytes, 17% of leukocytes, <5000/ul, phenotype is non specific.  Patient is asymptomatic, no constitutional symptoms. Recommend observation.  

## 2022-10-17 ENCOUNTER — Telehealth: Payer: Self-pay

## 2022-10-17 NOTE — Patient Outreach (Signed)
  Care Coordination   Follow Up Visit Note   10/17/2022 Name: Alizey Noren MRN: [id number] DOB: 1940/11/11  India Jolin is a 82 y.o. year old female who sees [name], MD for primary care. I spoke with  [name] by phone today.  What matters to the patients health and wellness today?  Patient voiced her appreciation of the assistance in handling her concerns.    Goals Addressed             This Visit's Progress    Patient Stated:  " Getting my diabetes under control and manage heart failure"       Interventions Today    Flowsheet Row Most Recent Value  Chronic Disease   Chronic disease during today's visit Diabetes  General Interventions   General Interventions Discussed/Reviewed General Interventions Reviewed  [patient notified her concerns regarding transportation issues have been addressed.  Informed that [employer] will no longer be providing her transportation as she has requested. Advised to contact her assigned RNCM for concerns/ questions. Marland Kitchen]                 SDOH assessments and interventions completed:  No     Care Coordination Interventions:  Yes, provided   Follow up plan: Follow up call scheduled for as previously scheduled.     Encounter Outcome:  Pt. Visit Completed   [name] RN,BSN,CCM [facility] Care Coordination [phone] direct line

## 2022-10-19 ENCOUNTER — Ambulatory Visit: Payer: Self-pay | Admitting: *Deleted

## 2022-10-19 NOTE — Patient Outreach (Signed)
  Care Coordination   Follow Up Visit Note   10/19/2022 Name: Alexis Farmer MRN: [id number] DOB: 01-07-1941  Alexis Farmer is a 82 y.o. year old female who sees [name], MD for primary care. I spoke with  [name] by phone today.  What matters to the patients health and wellness today?  Consistent transportation to her medical appointments, ongoing mental health support    Goals Addressed             This Visit's Progress    transportation options       Care Coordination Interventions: Patient will utilize a alternative transportation company to appointments to medical appointments Patient's granddaughter has agreed to transport patient to her next appointment on 10/31/22           SDOH assessments and interventions completed:  No     Care Coordination Interventions:  Yes, provided  Interventions Today    Flowsheet Row Most Recent Value  Chronic Disease   Chronic disease during today's visit Diabetes  General Interventions   General Interventions Discussed/Reviewed General Interventions Reviewed, [employer] (DME)  [Transportation discussed, patient missed her MD appt on 4/30 due to transportation issues, confirmed that she will be referred to a different transportation agency-granddaughter will provide transportation to appt on 10/31/22]  Mental Health Interventions   Mental Health Discussed/Reviewed Mental Health Reviewed  [patient continues to be provided with emotional support, ongoing mental health follow up encouraged]       Follow up plan: Follow up call scheduled for 11/02/22    Encounter Outcome:  Pt. Visit Completed

## 2022-10-19 NOTE — Patient Instructions (Signed)
Visit Information  Thank you for taking time to visit with me today. Please don't hesitate to contact me if I can be of assistance to you.   Following are the goals we discussed today:   Goals Addressed             This Visit's Progress    transportation options       Care Coordination Interventions: Patient will utilize a alternative transportation company to appointments to medical appointments Patient's granddaughter has agreed to transport patient to her next appointment on 10/31/22           Our next appointment is by telephone on 10/31/22 at 11am  Please call the care guide team at [phone] if you need to cancel or reschedule your appointment.   If you are experiencing a Mental Health or Behavioral Health Crisis or need someone to talk to, please call the Suicide and Crisis Lifeline: 988   Patient verbalizes understanding of instructions and care plan provided today and agrees to view in MyChart. Active MyChart status and patient understanding of how to access instructions and care plan via MyChart confirmed with patient.     Telephone follow up appointment with care management team member scheduled for: 10/31/22  Verna Czech, LCSW Clinical Social Worker  [facility] Care Management [phone]

## 2022-10-22 ENCOUNTER — Observation Stay
Admission: EM | Admit: 2022-10-22 | Discharge: 2022-10-23 | Disposition: A | Discharge status: Home / Self Care | Payer: Medicare Other | Attending: Internal Medicine | Admitting: Internal Medicine

## 2022-10-22 ENCOUNTER — Other Ambulatory Visit: Payer: Self-pay

## 2022-10-22 ENCOUNTER — Emergency Department: Payer: Medicare Other

## 2022-10-22 DIAGNOSIS — R55 Syncope and collapse: Secondary | ICD-10-CM

## 2022-10-22 DIAGNOSIS — Z7982 Long term (current) use of aspirin: Secondary | ICD-10-CM | POA: Insufficient documentation

## 2022-10-22 DIAGNOSIS — N1832 Chronic kidney disease, stage 3b: Secondary | ICD-10-CM

## 2022-10-22 DIAGNOSIS — E1159 Type 2 diabetes mellitus with other circulatory complications: Secondary | ICD-10-CM | POA: Diagnosis present

## 2022-10-22 DIAGNOSIS — I10 Essential (primary) hypertension: Secondary | ICD-10-CM

## 2022-10-22 DIAGNOSIS — Z794 Long term (current) use of insulin: Secondary | ICD-10-CM | POA: Diagnosis not present

## 2022-10-22 DIAGNOSIS — E1122 Type 2 diabetes mellitus with diabetic chronic kidney disease: Secondary | ICD-10-CM | POA: Diagnosis not present

## 2022-10-22 DIAGNOSIS — R42 Dizziness and giddiness: Secondary | ICD-10-CM | POA: Diagnosis not present

## 2022-10-22 DIAGNOSIS — E1169 Type 2 diabetes mellitus with other specified complication: Secondary | ICD-10-CM | POA: Diagnosis present

## 2022-10-22 DIAGNOSIS — I130 Hypertensive heart and chronic kidney disease with heart failure and stage 1 through stage 4 chronic kidney disease, or unspecified chronic kidney disease: Secondary | ICD-10-CM | POA: Insufficient documentation

## 2022-10-22 DIAGNOSIS — H543 Unqualified visual loss, both eyes: Secondary | ICD-10-CM | POA: Diagnosis not present

## 2022-10-22 DIAGNOSIS — E1129 Type 2 diabetes mellitus with other diabetic kidney complication: Secondary | ICD-10-CM

## 2022-10-22 DIAGNOSIS — Z79899 Other long term (current) drug therapy: Secondary | ICD-10-CM | POA: Diagnosis not present

## 2022-10-22 DIAGNOSIS — D72820 Lymphocytosis (symptomatic): Secondary | ICD-10-CM | POA: Diagnosis not present

## 2022-10-22 DIAGNOSIS — I5032 Chronic diastolic (congestive) heart failure: Secondary | ICD-10-CM

## 2022-10-22 LAB — CBC WITH DIFFERENTIAL/PLATELET
Abs Immature Granulocytes: 0.02 10*3/uL (ref 0.00–0.07)
Basophils Absolute: 0.1 10*3/uL (ref 0.0–0.1)
Basophils Relative: 1 %
Eosinophils Absolute: 0.3 10*3/uL (ref 0.0–0.5)
Eosinophils Relative: 3 %
HCT: 40.3 % (ref 36.0–46.0)
Hemoglobin: 12.9 g/dL (ref 12.0–15.0)
Immature Granulocytes: 0 %
Lymphocytes Relative: 56 %
Lymphs Abs: 5.7 10*3/uL — ABNORMAL HIGH (ref 0.7–4.0)
MCH: 28.8 pg (ref 26.0–34.0)
MCHC: 32 g/dL (ref 30.0–36.0)
MCV: 90 fL (ref 80.0–100.0)
Monocytes Absolute: 0.8 10*3/uL (ref 0.1–1.0)
Monocytes Relative: 7 %
Neutro Abs: 3.4 10*3/uL (ref 1.7–7.7)
Neutrophils Relative %: 33 %
Platelets: 258 10*3/uL (ref 150–400)
RBC: 4.48 MIL/uL (ref 3.87–5.11)
RDW: 13.2 % (ref 11.5–15.5)
Smear Review: NORMAL
WBC Morphology: >10
WBC: 10.3 10*3/uL (ref 4.0–10.5)
nRBC: 0 % (ref 0.0–0.2)

## 2022-10-22 LAB — URINALYSIS, ROUTINE W REFLEX MICROSCOPIC
Bacteria, UA: NONE SEEN
Bilirubin Urine: NEGATIVE
Glucose, UA: 150 mg/dL — AB
Ketones, ur: NEGATIVE mg/dL
Nitrite: NEGATIVE
Protein, ur: 100 mg/dL — AB
Specific Gravity, Urine: 1.005 (ref 1.005–1.030)
pH: 6 (ref 5.0–8.0)

## 2022-10-22 LAB — COMPREHENSIVE METABOLIC PANEL
ALT: 30 U/L (ref 0–44)
AST: 32 U/L (ref 15–41)
Albumin: 4.3 g/dL (ref 3.5–5.0)
Alkaline Phosphatase: 41 U/L (ref 38–126)
Anion gap: 12 (ref 5–15)
BUN: 35 mg/dL — ABNORMAL HIGH (ref 8–23)
CO2: 23 mmol/L (ref 22–32)
Calcium: 10.4 mg/dL — ABNORMAL HIGH (ref 8.9–10.3)
Chloride: 100 mmol/L (ref 98–111)
Creatinine, Ser: 1.52 mg/dL — ABNORMAL HIGH (ref 0.44–1.00)
GFR, Estimated: 34 mL/min — ABNORMAL LOW (ref >60)
Glucose, Bld: 266 mg/dL — ABNORMAL HIGH (ref 70–99)
Potassium: 4.5 mmol/L (ref 3.5–5.1)
Sodium: 135 mmol/L (ref 135–145)
Total Bilirubin: 1.1 mg/dL (ref 0.3–1.2)
Total Protein: 7.9 g/dL (ref 6.5–8.1)

## 2022-10-22 LAB — HEMOGLOBIN A1C
Hemoglobin A1C: 9.5
Hgb A1c MFr Bld: 9.5 % — ABNORMAL HIGH (ref 4.8–5.6)
Mean Plasma Glucose: 225.95 mg/dL

## 2022-10-22 LAB — TROPONIN I (HIGH SENSITIVITY)
Troponin I (High Sensitivity): 16 ng/L (ref <18)
Troponin I (High Sensitivity): 21 ng/L — ABNORMAL HIGH (ref <18)

## 2022-10-22 LAB — GLUCOSE, CAPILLARY
Glucose-Capillary: 314 mg/dL — ABNORMAL HIGH (ref 70–99)
Glucose-Capillary: 200 mg/dL — ABNORMAL HIGH (ref 70–99)
Glucose-Capillary: 165 mg/dL — ABNORMAL HIGH (ref 70–99)

## 2022-10-22 LAB — LIPASE, BLOOD: Lipase: 27 U/L (ref 11–51)

## 2022-10-22 MED ORDER — INSULIN ASPART 100 UNIT/ML IJ SOLN
0.0000 [IU] | Freq: Three times a day (TID) | INTRAMUSCULAR | Status: DC
  Administered 2022-10-22: 15 [IU] via SUBCUTANEOUS
  Administered 2022-10-23: 20 [IU] via SUBCUTANEOUS
  Administered 2022-10-23: 15 [IU] via SUBCUTANEOUS
  Filled 2022-10-22 (×3): qty 1

## 2022-10-22 MED ORDER — IOHEXOL 350 MG/ML SOLN
60.0000 mL | Freq: Once | INTRAVENOUS | Status: AC | PRN
  Administered 2022-10-22: 60 mL via INTRAVENOUS

## 2022-10-22 MED ORDER — NEBIVOLOL HCL 10 MG PO TABS
10.0000 mg | ORAL_TABLET | Freq: Every day | ORAL | Status: DC
  Administered 2022-10-23: 10 mg via ORAL
  Filled 2022-10-22: qty 1

## 2022-10-22 MED ORDER — ROSUVASTATIN CALCIUM 10 MG PO TABS
10.0000 mg | ORAL_TABLET | Freq: Every day | ORAL | Status: DC
  Administered 2022-10-22 – 2022-10-23 (×2): 10 mg via ORAL
  Filled 2022-10-22 (×3): qty 1

## 2022-10-22 MED ORDER — MORPHINE SULFATE (PF) 4 MG/ML IV SOLN
4.0000 mg | Freq: Once | INTRAVENOUS | Status: AC
  Administered 2022-10-22: 4 mg via INTRAVENOUS
  Filled 2022-10-22: qty 1

## 2022-10-22 MED ORDER — ENOXAPARIN SODIUM 40 MG/0.4ML IJ SOSY
40.0000 mg | PREFILLED_SYRINGE | INTRAMUSCULAR | Status: DC
  Administered 2022-10-22: 40 mg via SUBCUTANEOUS
  Filled 2022-10-22: qty 0.4

## 2022-10-22 MED ORDER — TORSEMIDE 20 MG PO TABS
10.0000 mg | ORAL_TABLET | Freq: Every day | ORAL | Status: DC
  Administered 2022-10-22 – 2022-10-23 (×2): 10 mg via ORAL
  Filled 2022-10-22 (×2): qty 1

## 2022-10-22 MED ORDER — ONDANSETRON HCL 4 MG PO TABS: 4.0000 mg | ORAL_TABLET | Freq: Four times a day (QID) | ORAL | Status: DC | PRN

## 2022-10-22 MED ORDER — NEBIVOLOL HCL 10 MG PO TABS: 10.0000 mg | ORAL_TABLET | Freq: Every day | ORAL | Status: DC

## 2022-10-22 MED ORDER — ACETAMINOPHEN 325 MG PO TABS
650.0000 mg | ORAL_TABLET | Freq: Four times a day (QID) | ORAL | Status: DC | PRN
  Administered 2022-10-22 – 2022-10-23 (×2): 650 mg via ORAL
  Filled 2022-10-22 (×2): qty 2

## 2022-10-22 MED ORDER — HYDRALAZINE HCL 50 MG PO TABS
100.0000 mg | ORAL_TABLET | Freq: Four times a day (QID) | ORAL | Status: DC
  Administered 2022-10-22 – 2022-10-23 (×3): 100 mg via ORAL
  Filled 2022-10-22 (×3): qty 2

## 2022-10-22 MED ORDER — INSULIN GLARGINE-YFGN 100 UNIT/ML ~~LOC~~ SOLN
30.0000 [IU] | Freq: Every day | SUBCUTANEOUS | Status: DC
  Administered 2022-10-22: 30 [IU] via SUBCUTANEOUS
  Filled 2022-10-22 (×2): qty 0.3

## 2022-10-22 MED ORDER — LACTATED RINGERS IV BOLUS: 500.0000 mL | Freq: Once | INTRAVENOUS | Status: DC

## 2022-10-22 MED ORDER — ACETAMINOPHEN 650 MG RE SUPP: 650.0000 mg | Freq: Four times a day (QID) | RECTAL | Status: DC | PRN

## 2022-10-22 MED ORDER — SPIRONOLACTONE 25 MG PO TABS
25.0000 mg | ORAL_TABLET | Freq: Every day | ORAL | Status: DC
  Administered 2022-10-22 – 2022-10-23 (×2): 25 mg via ORAL
  Filled 2022-10-22 (×2): qty 1

## 2022-10-22 MED ORDER — IRBESARTAN 150 MG PO TABS
150.0000 mg | ORAL_TABLET | Freq: Every day | ORAL | Status: DC
  Administered 2022-10-22 – 2022-10-23 (×2): 150 mg via ORAL
  Filled 2022-10-22 (×3): qty 1

## 2022-10-22 MED ORDER — ONDANSETRON HCL 4 MG/2ML IJ SOLN
4.0000 mg | Freq: Once | INTRAMUSCULAR | Status: AC
  Filled 2022-10-22: qty 2

## 2022-10-22 MED ORDER — ONDANSETRON HCL 4 MG/2ML IJ SOLN
INTRAMUSCULAR | Status: AC
  Administered 2022-10-22: 4 mg
  Filled 2022-10-22: qty 2

## 2022-10-22 MED ORDER — TRAZODONE HCL 50 MG PO TABS
25.0000 mg | ORAL_TABLET | Freq: Every evening | ORAL | Status: DC | PRN
  Administered 2022-10-22: 25 mg via ORAL
  Filled 2022-10-22: qty 1

## 2022-10-22 MED ORDER — CLONIDINE HCL 0.1 MG PO TABS
0.2000 mg | ORAL_TABLET | Freq: Two times a day (BID) | ORAL | Status: DC
  Administered 2022-10-22 – 2022-10-23 (×3): 0.2 mg via ORAL
  Filled 2022-10-22 (×3): qty 2

## 2022-10-22 MED ORDER — ASPIRIN 81 MG PO TBEC
81.0000 mg | DELAYED_RELEASE_TABLET | ORAL | Status: DC
  Administered 2022-10-22: 81 mg via ORAL
  Filled 2022-10-22: qty 1

## 2022-10-22 MED ORDER — OXYCODONE-ACETAMINOPHEN 5-325 MG PO TABS
1.0000 | ORAL_TABLET | Freq: Once | ORAL | Status: AC
  Administered 2022-10-22: 1 via ORAL
  Filled 2022-10-22: qty 1

## 2022-10-22 MED ORDER — SODIUM CHLORIDE 0.9% FLUSH
3.0000 mL | Freq: Two times a day (BID) | INTRAVENOUS | Status: DC
  Administered 2022-10-22 (×2): 3 mL via INTRAVENOUS

## 2022-10-22 MED ORDER — LORAZEPAM 0.5 MG PO TABS
0.2500 mg | ORAL_TABLET | Freq: Every day | ORAL | Status: DC | PRN
  Administered 2022-10-23: 0.25 mg via ORAL
  Filled 2022-10-22: qty 1

## 2022-10-22 MED ORDER — LACTATED RINGERS IV BOLUS: 1000.0000 mL | Freq: Once | INTRAVENOUS | Status: DC

## 2022-10-22 MED ORDER — ONDANSETRON HCL 4 MG/2ML IJ SOLN: 4.0000 mg | Freq: Four times a day (QID) | INTRAMUSCULAR | Status: DC | PRN

## 2022-10-22 NOTE — Assessment & Plan Note (Addendum)
Patient is presenting with sudden onset bilateral vision loss that occurred this morning that gradually improved.  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  Given symptoms were bilateral, unlikely that this is contributing. EDP discussed with Dr. Selina Cooley, who did not feel symptoms were consistent with TIA.  I discussed with patient my plan to obtain an MRI for completion of workup, however she declined. 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  Symptoms have resolved at this time.  - Informed patient to let us know if she changes her mind about the MRI

## 2022-10-22 NOTE — Assessment & Plan Note (Signed)
Minimal and stable at this time.  Unlikely to be contributing to current symptoms

## 2022-10-22 NOTE — Assessment & Plan Note (Signed)
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  She has a follow-up with oncology coming up in 9 days.  - Outpatient follow-up with oncology

## 2022-10-22 NOTE — Assessment & Plan Note (Signed)
Last A1c of 9.7% in January 2024.  Follows with Endocrinology  - SSI, resistant - Semglee 30 units at bedtime

## 2022-10-22 NOTE — H&P (Addendum)
History and Physical    Patient: Alexis Farmer WGN:[id number] DOB: 06-Aug-1940 DOA: 10/22/2022 DOS: the patient was seen and examined on 10/22/2022 PCP: [name], MD  Patient coming from: Home  Chief Complaint:  Chief Complaint  Patient presents with   Multiple Complaints   Hypertension   HPI: [name] is a 82 y.o. 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  Alexis Farmer states that she has not been feeling well over the last several months, including experiencing night sweats and weight loss.  However, she has felt particularly unwell this last week and has been having occasional confusion, such as getting lost in her own home.  Then this morning, she awoke due to bilateral shoulder pain.  When she sat up, she realized that she was not able to see anything bilaterally.  She became scared she and stood up.  At that time, she developed dizziness and generalized weakness.  She notes that she felt confused at this time and she could not get her bearings.  Her vision started to slowly come back and she was able to walk to the restroom.  Dizziness gradually resolved.  She notes that she can hear her pulse at night and her ear and feels that her heart is going very fast.  She endorses abdominal pain that is in the epigastric region and occasionally goes to the right upper quadrant.  She endorses several month history of bilateral numbness and tingling of her hands, diarrhea, urinary frequency.  She states that she last took her home blood pressure medicines yesterday.  ED course: On arrival to the ED, patient was hypertensive at 216/78 with heart rate of 81.  She was saturating at 97% on room air.  She was afebrile at 99.5.  Blood pressure  subsequently improved to as low as 109/80 and then increase back up to 165/59 without any intervention.  Initial workup notable for normal CBC, and CMP with baseline renal function with BUN of 35 and creatinine of 1.52 and GFR of 34.  Troponin within normal limits with flat trend to 21. 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  CT of the abdomen was obtained that demonstrated no acute intra-abdominal pathology.  Left lower extremity Doppler obtained that was negative for DVT.  Chest x-ray was obtained that demonstrated 1.1 cm indeterminate nodular opacity in the left upper lobe, but no other abnormalities.  Due to patient's concern of safety with discharge home, [facility] contacted for admission.  Review of Systems: As mentioned in the history of present illness. All other systems reviewed and are negative.  Past Medical History:  Diagnosis Date   Anxiety 09/13/2015   Arthritis    Atypical chest pain 01/15/2021   Blackhead 10/24/2021   Bradycardia 07/17/2016   Formatting of this note might be different from the original.  Last Assessment & Plan:   Found to be bradycardic at outside hospital. They stopped metoprolol and verapamil and heart rate has been normal since then. Echo appears to have been reassuring at the outside facility. Appears to be doing much better with regards to this. She'll continue to  monitor for recurrent symptoms. 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  Last Assessment & Plan:   Continues to have issues with this.  She is following with a specialist and it sounds as though they are planning on an MRI.  Benign exam today.   Chickenpox    Chronic kidney  disease, stage 2 (mild) 04/08/2014   Dr. Loletha Carrow of this note might be different from the original.  Dr. Thedore Mins      Last Assessment & Plan:   Recheck kidney function today.   Constipation 11/17/2019   Cough 01/25/2016   Formatting of this note might be different from the original.  Last Assessment & Plan:   Minimal nighttime cough. Improves when she washes her pillows consistently. Suspect allergies contributing. She'll monitor.   Diabetes mellitus without complication (HCC)    one elevated reading/ no treatment   Disorder of rotator cuff 06/24/2018   Diverticulitis    Dysuria 03/22/2017   Formatting of this note might be different from the original.  Last Assessment & Plan:   Symptoms concerning for UTI.  Will check urinalysis.   Edema of foot 04/08/2014   Formatting of this note might be different from the original.  Last Assessment & Plan:   Chronic pedal edema. Suspect venous insufficiency. No orthopnea or shortness of breath. Advised elevation of her legs. Consider compression stockings in the future.   Elevated troponin 12/15/2020   Essential hypertension 04/08/2014   Formatting of this note might be different from the original.  Last Assessment & Plan:   Well-controlled on recheck.  Continue current regimen.   Fall 03/26/2018   Gastroenteritis 08/12/2021   Gastrointestinal hemorrhage 08/03/2015   GI bleed    High cholesterol    History of blood transfusion    Hyperglycemia due to type 2 diabetes mellitus (HCC) 03/11/2015   Hypertension    Hypertensive urgency 12/15/2020   Low back pain 04/08/2014   Morbid obesity (HCC) 04/08/2014   Formatting of this note might be different from the original.  Last Assessment & Plan:   Weight is stable. Discussed diet and exercise at length. Encouraged whatever exercise she can do. Given diet information.   Palpitations 12/15/2020   Peripheral edema 12/22/2019   Postmenopausal bleeding 07/17/2016   Formatting of this note might be  different from the original.  Last Assessment & Plan:   Recent D&C. Following with gynecology. Benign findings. Monitor for recurrence.   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  Last Assessment & Plan:   Continues to have issues with this.  She is following with a specialist and it sounds as though they are planning on an MRI.  Benign exam today.   Recurrent falls 08/13/2019   Renal insufficiency    Skin cyst 10/24/2021   Stage 3a chronic kidney disease (HCC) 01/15/2021   Swelling of lower leg 07/19/2016   Vertigo 10/13/2015   Formatting of this note might be different from the original.  Last Assessment & Plan:   No recurrence. Discussed that meclizine is an as needed medication and that she does not need to take this daily. She will continue to monitor.   Past Surgical History:  Procedure Laterality Date   APPENDECTOMY     CHOLECYSTECTOMY  ECTOPIC PREGNANCY SURGERY     EYE SURGERY     bilateral cataracts   EYE SURGERY     02/11/2019 repair hole in right eye    gallbladder      HYSTEROSCOPY WITH D & C N/A 10/26/2016   Procedure: DILATATION AND CURETTAGE /HYSTEROSCOPY;  Surgeon: Christeen Douglas, MD;  Location: [facility];  Service: Gynecology;  Laterality: N/A;   HYSTEROSCOPY WITH D & C N/A 07/07/2018   Procedure: DILATATION AND CURETTAGE /HYSTEROSCOPY;  Surgeon: Christeen Douglas, MD;  Location: [facility];  Service: Gynecology;  Laterality: N/A;   THYROIDECTOMY, PARTIAL     Social History:  reports that she has never smoked. She has never used smokeless tobacco. She reports that she does not drink alcohol and does not use drugs.  Allergies  Allergen Reactions   Celexa [Citalopram]     Diarrhea upset stomach    Jardiance [Empagliflozin] Other (See Comments)    Reaction not recalled   Norvasc [Amlodipine]     Leg edema   Tape Other  (See Comments)    Leaves the skin "raw" if left on for a period of time- tolerates paper tape   Penicillin V Rash   Penicillin V Potassium Rash   Penicillins Rash    Has patient had a PCN reaction causing immediate rash, facial/tongue/throat swelling, SOB or lightheadedness with hypotension: Yes Has patient had a PCN reaction causing severe rash involving mucus membranes or skin necrosis: No Has patient had a PCN reaction that required hospitalization No Has patient had a PCN reaction occurring within the last 10 years: Yes If all of the above answers are "NO", then may proceed with Cephalosporin use.     Family History  Problem Relation Age of Onset   Diabetes Mother    Hypertension Mother    Stroke Mother    Diabetes Other    Healthy Father    Diabetes Sister    Heart disease Sister     Prior to Admission medications   Medication Sig Start Date End Date Taking? Authorizing Provider  acetaminophen (TYLENOL) 500 MG tablet Take 500 mg by mouth every 6 (six) hours as needed for mild pain or headache.    [provider]  Ascorbic Acid (VITAMIN C PO) Take 1 tablet by mouth daily.    [provider]  aspirin EC 81 MG tablet Take 81 mg by mouth 2 (two) times a week. Swallow whole.    [provider]  BD INSULIN SYRINGE U/F 30G X 1/2" 0.5 ML MISC USE UP TO 5X PER DAY WITH INSULIN 2X (NPH) AND 3X (REGULAR) 10/02/21   McLean-Scocuzza, Pasty Spillers, MD  blood glucose meter kit and supplies KIT Accu chek, Dx code E11.65, check 3 times daily 07/26/17   Glori Luis, MD  Blood Pressure KIT 1 Device by Does not apply route daily. 10/24/21   McLean-Scocuzza, Pasty Spillers, MD  cloNIDine (CATAPRES) 0.2 MG tablet Take 1 tablet (0.2 mg total) by mouth 2 (two) times daily. 12/18/21   Antonieta Iba, MD  diazepam (VALIUM) 5 MG tablet  11/25/17   [provider]  gabapentin (NEURONTIN) 300 MG capsule  12/25/17   [provider]  glucose blood (ACCU-CHEK GUIDE) test  strip USE AS INSTRUCTED 3 TIMES DAILY 09/06/22   [name], MD  hydrALAZINE (APRESOLINE) 100 MG tablet TAKE 1 TABLET (100 MG TOTAL) BY MOUTH 4 (FOUR) TIMES DAILY. 09/11/22   Antonieta Iba, MD  insulin NPH Human (NOVOLIN N) 100  UNIT/ML injection INJECT 20 UNITS IN THE MORNING AND THE EVENING WITH A MEAL 04/10/21   McLean-Scocuzza, Pasty Spillers, MD  insulin regular (NOVOLIN R) 100 units/mL injection Inject 0-0.08 mLs (0-8 Units total) into the skin 3 (three) times daily before meals. 10/17/20   Romero Belling, MD  LORazepam (ATIVAN) 1 MG tablet Take 0.5 tablets (0.5 mg total) by mouth daily as needed for anxiety. Inform pt 0.5 dose on backorder has to cut this in 1/2 dose 03/21/22   McLean-Scocuzza, Pasty Spillers, MD  Multiple Vitamins-Minerals (CENTRUM SILVER 50+WOMEN) TABS Take 1 tablet by mouth daily with breakfast.    [provider]  nebivolol (BYSTOLIC) 10 MG tablet Take 1 tablet (10 mg total) by mouth daily. 07/10/22 01/06/23  Antonieta Iba, MD  nystatin (MYCOSTATIN/NYSTOP) powder Apply topically 2 (two) times daily. Under abdomen 03/21/22   McLean-Scocuzza, Pasty Spillers, MD  polyethylene glycol powder (GLYCOLAX/MIRALAX) 17 GM/SCOOP powder Take 17 g by mouth daily as needed for moderate constipation or severe constipation. Can take up to 2x per day prn. Mix with 8 ounces of liquid 03/21/22   McLean-Scocuzza, Pasty Spillers, MD  rosuvastatin (CRESTOR) 20 MG tablet TAKE 1 TABLET BY MOUTH EVERY DAY 03/21/22   McLean-Scocuzza, Pasty Spillers, MD  spironolactone (ALDACTONE) 25 MG tablet Take 1 tablet (25 mg total) by mouth daily. 12/13/21   McLean-Scocuzza, Pasty Spillers, MD  torsemide (DEMADEX) 10 MG tablet Take 1 tablet (10 mg total) by mouth daily. 07/11/21   McLean-Scocuzza, Pasty Spillers, MD  valsartan (DIOVAN) 160 MG tablet Take 1 tablet (160 mg total) by mouth daily. 09/17/22   Antonieta Iba, MD    Physical Exam: Vitals:   10/22/22 0901 10/22/22 1000 10/22/22 1246 10/22/22 1300  BP:  109/80  (!) 165/59  Pulse:  72  60   Resp:  18  17  Temp: 98.4 F (36.9 C)  98.5 F (36.9 C)   TempSrc: Oral  Oral   SpO2:  95%  92%  Weight:      Height:       Physical Exam Vitals and nursing note reviewed.  Constitutional:      General: She is not in acute distress.    Appearance: She is obese.  HENT:     Head: Normocephalic and atraumatic.     Mouth/Throat:     Mouth: Mucous membranes are moist.     Pharynx: Oropharynx is clear.  Eyes:     General: No scleral icterus.    Extraocular Movements: Extraocular movements intact.     Conjunctiva/sclera: Conjunctivae normal.     Pupils: Pupils are equal, round, and reactive to light.  Cardiovascular:     Rate and Rhythm: Normal rate and regular rhythm.     Heart sounds: No murmur heard.    Comments: Bilateral pitting edema, left worse than right, apparently chronic Pulmonary:     Effort: Pulmonary effort is normal. No respiratory distress.     Breath sounds: Normal breath sounds. No wheezing, rhonchi or rales.  Abdominal:     General: Bowel sounds are normal.     Palpations: Abdomen is soft.     Tenderness: There is abdominal tenderness (diffusely, seems worse in the epigastric region). There is no guarding.     Hernia: No hernia is present.  Musculoskeletal:     Cervical back: Neck supple.     Comments: Essentially diffuse point tenderness to palpation including bilateral shoulders, arms, and lower extremities.  Neurological:     Mental Status: She  is alert.     Comments:  Patient is alert and oriented x 3. No facial asymmetry or dysarthria No visual field deficit 5 out of 5 strength throughout Sensation intact throughout Normal bilateral finger-to-nose  Psychiatric:        Mood and Affect: Mood is anxious. Affect is tearful.        Speech: Speech normal.        Behavior: Behavior is cooperative.        Thought Content: Thought content is not paranoid or delusional.        Cognition and Memory: Cognition and memory normal.    Data Reviewed: CBC  with WBC of 10.3, hemoglobin 12.9, and platelets of 258 CMP with sodium of 135, potassium 4.5, bicarb 23, glucose 166, BUN 35, creatinine 1.52, calcium 10.4, AST 32, ALT 30 and GFR 34 Initial troponin within normal limits at 16 with flat trend to 21 Urinalysis with trace leukocytes, small hematuria, glucosuria, proteinuria, no bacteria but 11-20 WBCs/hpf.  Flow cytometry obtained on 10/10/2022,Notable for monoclonal B-cell population with nonspecific phenotype  EKG personally reviewed.Sinus rhythm with rate of 75.  First-degree AV block.  No ischemic appearing ST or T wave changes.  US Venous Img Lower Unilateral Left  Result Date: 10/22/2022 CLINICAL DATA:  Left lower extremity pain and edema. Evaluate for DVT. EXAM: LEFT LOWER EXTREMITY VENOUS DOPPLER ULTRASOUND TECHNIQUE: Gray-scale sonography with graded compression, as well as color Doppler and duplex ultrasound were performed to evaluate the lower extremity deep venous systems from the level of the common femoral vein and including the common femoral, femoral, profunda femoral, popliteal and calf veins including the posterior tibial, peroneal and gastrocnemius veins when visible. The superficial great saphenous vein was also interrogated. Spectral Doppler was utilized to evaluate flow at rest and with distal augmentation maneuvers in the common femoral, femoral and popliteal veins. COMPARISON:  None Available. FINDINGS: Contralateral Common Femoral Vein: Respiratory phasicity is normal and symmetric with the symptomatic side. No evidence of thrombus. Normal compressibility. Common Femoral Vein: No evidence of thrombus. Normal compressibility, respiratory phasicity and response to augmentation. Saphenofemoral Junction: No evidence of thrombus. Normal compressibility and flow on color Doppler imaging. Profunda Femoral Vein: No evidence of thrombus. Normal compressibility and flow on color Doppler imaging. Femoral Vein: No evidence of thrombus. Normal  compressibility, respiratory phasicity and response to augmentation. Popliteal Vein: No evidence of thrombus. Normal compressibility, respiratory phasicity and response to augmentation. Calf Veins: Not well visualized. Superficial Great Saphenous Vein: No evidence of thrombus. Normal compressibility. Other Findings: There is a minimal amount of subcutaneous edema at the level of the left calf. IMPRESSION: No evidence of DVT within the left lower extremity. Electronically Signed   By: Simonne Come M.D.   On: 10/22/2022 10:03   CT ANGIO HEAD NECK W WO CM  Result Date: 10/22/2022 CLINICAL DATA:  Vision changes and ringing in the ears. EXAM: CT ANGIOGRAPHY HEAD AND NECK WITH AND WITHOUT CONTRAST TECHNIQUE: Multidetector CT imaging of the head and neck was performed using the standard protocol during bolus administration of intravenous contrast. Multiplanar CT image reconstructions and MIPs were obtained to evaluate the vascular anatomy. Carotid stenosis measurements (when applicable) are obtained utilizing NASCET criteria, using the distal internal carotid diameter as the denominator. RADIATION DOSE REDUCTION: This exam was performed according to the departmental dose-optimization program which includes automated exposure control, adjustment of the mA and/or kV according to patient size and/or use of iterative reconstruction technique. CONTRAST:  60mL OMNIPAQUE IOHEXOL 350  MG/ML SOLN COMPARISON:  None Available. FINDINGS: CT HEAD FINDINGS Brain: There is no acute intracranial hemorrhage, extra-axial fluid collection, or acute territorial infarct. Parenchymal volume is normal for age. The ventricles are normal in size. Gray-white differentiation is preserved. Hypodensity in the supratentorial white matter likely reflects sequela of underlying chronic small-vessel ischemic change. The pituitary and suprasellar region are normal. There is no mass lesion there is no mass effect or midline shift. Vascular: See below.  Skull: Normal. Negative for fracture or focal lesion. Sinuses/Orbits: The paranasal sinuses are clear. The mastoid air cells and middle ear cavities are clear. Bilateral lens implants are in place. The globes and orbits are otherwise unremarkable. Other: None. Review of the MIP images confirms the above findings CTA NECK FINDINGS Aortic arch: There is calcified plaque in the aortic arch. 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 The origins of the major branch vessels are patent. Right carotid system: The right common carotid artery is patent. There is bulky plaque in the proximal internal carotid artery resulting in up to approximately 60-70% stenosis. The distal internal carotid artery is patent. The external carotid artery is patent. There is no evidence of dissection or aneurysm. Left carotid system: The left common carotid artery is patent with mild plaque. There is mild plaque at the bifurcation and in the proximal internal carotid artery without hemodynamically significant stenosis or occlusion. The external carotid artery is patent. There is no evidence of dissection or aneurysm. Vertebral arteries: The vertebral arteries are patent, without hemodynamically significant stenosis or occlusion there is no evidence of dissection or aneurysm. Skeleton: There is no acute osseous abnormality or suspicious osseous lesion. There is no visible canal hematoma. Other neck: The soft tissues of the neck are unremarkable. Upper chest: The imaged lung apices are clear. Review of the MIP images confirms the above findings CTA HEAD FINDINGS Anterior circulation: There is calcified plaque in the intracranial ICAs resulting mild-to-moderate stenosis of the supraclinoid segments. The bilateral MCAs are patent, without proximal stenosis or occlusion. The bilateral ACAs are patent, without proximal stenosis or occlusion. The  anterior communicating artery is normal. There is no aneurysm or AVM. Posterior circulation: There is mild plaque in the bilateral V4 segments resulting in up to mild stenosis on the right. The basilar artery is patent. The major cerebellar arteries appear patent. The bilateral PCAs are patent, with focal mild-to-moderate stenosis of the distal left P1 segment. There is no other proximal stenosis or occlusion. There is no aneurysm or AVM. Venous sinuses: Patent. Anatomic variants: None. Review of the MIP images confirms the above findings IMPRESSION: 1. No acute intracranial pathology on initial noncontrast head CT. 2. Bulky plaque at the right carotid bifurcation resulting in approximately 60-70% stenosis of the proximal internal carotid artery. Mild plaque at the left carotid bifurcation without significant stenosis. 3. Intracranial atherosclerotic disease resulting in mild-to-moderate stenosis of both supraclinoid ICAs, and mild stenosis of the right V4 and left P1 segments. 4. 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 5. 2.4 cm heterogeneous left thyroid nodule. Consider further evaluation with nonemergent thyroid ultrasound as indicated given patient age. Electronically Signed   By: Lesia Hausen M.D.   On: 10/22/2022 10:03   CT ABDOMEN PELVIS WO CONTRAST  Result Date: 10/22/2022 CLINICAL DATA:  Abdominal pain.  Nonlocalized. EXAM: CT ABDOMEN AND PELVIS WITHOUT CONTRAST TECHNIQUE:  Multidetector CT imaging of the abdomen and pelvis was performed following the standard protocol without IV contrast. RADIATION DOSE REDUCTION: This exam was performed according to the departmental dose-optimization program which includes automated exposure control, adjustment of the mA and/or kV according to patient size and/or use of iterative reconstruction technique. COMPARISON:  08/12/2021 FINDINGS: Lower chest: No acute abnormality. Similar appearance of  subpleural scarring and architectural distortion within the paravertebral right lung base. No pleural fluid or acute airspace disease. Hepatobiliary: No focal liver abnormality. Status post cholecystectomy. No bile duct dilatation. Pancreas: Unremarkable. No pancreatic ductal dilatation or surrounding inflammatory changes. Spleen: Normal in size without focal abnormality. Adrenals/Urinary Tract: Normal adrenal glands. No nephrolithiasis, hydronephrosis or suspicious mass. Urinary bladder is unremarkable. Stomach/Bowel: Stomach appears normal. The patient is status post appendectomy. No pathologic dilatation of the large or small bowel loops. No bowel wall thickening or inflammation. Vascular/Lymphatic: Aortic atherosclerosis. No abdominopelvic adenopathy. Reproductive: Uterus and bilateral adnexa are unremarkable. Other: No ascites or focal fluid collections. Musculoskeletal: No acute or significant osseous findings. There is a first degree anterolisthesis of L4 on L5. Multi level disc space narrowing and endplate spurring noted. Bilateral facet arthropathy. IMPRESSION: 1. No acute findings within the abdomen or pelvis. 2. Status post cholecystectomy and appendectomy. 3. 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 4.  Aortic Atherosclerosis (ICD10-I70.0). Electronically Signed   By: Signa Kell M.D.   On: 10/22/2022 09:54   DG Chest Portable 1 View  Result Date: 10/22/2022 CLINICAL DATA:  Chest pain. EXAM: PORTABLE CHEST 1 VIEW COMPARISON:  08/14/2021 FINDINGS: Stable mild cardiac enlargement. Aortic atherosclerotic calcifications. Pulmonary vascular congestion. No airspace consolidation. 1.1 cm indeterminate nodular opacity within the periphery of the left upper lobe. The visualized osseous structures are unremarkable. IMPRESSION: 1. Mild cardiac enlargement and pulmonary vascular congestion. 2. 1.1 cm indeterminate nodular opacity within the periphery of the left upper lobe. Consider  further evaluation with nonemergent CT of the chest. Electronically Signed   By: Signa Kell M.D.   On: 10/22/2022 05:38    Results are pending, will review when available.  Assessment and Plan:  * Vision loss, bilateral Patient is presenting with sudden onset bilateral vision loss that occurred this morning that gradually improved.  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  Given symptoms were bilateral, unlikely that this is contributing. EDP discussed with Dr. Selina Cooley, who did not feel symptoms were consistent with TIA.  I discussed with patient my plan to obtain an MRI for completion of workup, however she declined. 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  Symptoms have resolved at this time.  - Informed patient to let us know if she changes her mind about the MRI  Pre-syncope Patient endorses dizziness when standing this morning that is most consistent with orthostasis, however given history of palpitations, EKG with first-degree AV block and reported history of elevated heart rate over the last week, will monitor on telemetry overnight.  - Telemetry monitoring - Orthostatic vital signs ordered  Severe hypertension History of severe hypertension that has been difficult to control.  Patient is on several antihypertensives at home.  Will resume at this time.  - Restart home clonidine, hydralazine, Bystolic, spironolactone, and valsartan  Monoclonal B-cell lymphocytosis of undetermined significance Patient was recently noted to have B-cell lymphocytosis of undetermined significance.  She has a follow-up with oncology coming up in 9 days.  - Outpatient follow-up with oncology  Stage 3b chronic kidney disease (HCC) Renal function is stable at this time.  Will continue to  monitor while admitted.  - Repeat BMP in the a.m.  Hypercalcemia Minimal and stable at this time.  Unlikely to be contributing to current symptoms  Chronic diastolic CHF (congestive heart failure) (HCC) Minimal hypervolemia on exam with +1 bilateral pitting edema but no evidence of pulmonary edema.  - Restart home torsemide  Type 2 diabetes mellitus with other diabetic kidney complication (HCC) Last A1c of 9.7% in January 2024.  Follows with Endocrinology  - SSI, resistant - Semglee 30 units at bedtime  Advance Care Planning:   Code Status: Full Code verified by patient.   Consults: None  Family Communication: No family at bedside.   Severity of Illness: The appropriate patient status for this patient is OBSERVATION. Observation status is judged to be reasonable and necessary in order to provide the required intensity of service to ensure the patient's safety. The patient's presenting symptoms, physical exam findings, and initial radiographic and laboratory data in the context of their medical condition is felt to place them at decreased risk for further clinical deterioration. Furthermore, it is anticipated that the patient will be medically stable for discharge from the hospital within 2 midnights of admission.   Author: Verdene Lennert, MD 10/22/2022 3:33 PM  For on call review [email or web address].

## 2022-10-22 NOTE — Assessment & Plan Note (Signed)
Patient endorses dizziness when standing this morning that is most consistent with orthostasis, however given history of palpitations, EKG with first-degree AV block and reported history of elevated heart rate over the last week, will monitor on telemetry overnight.  - Telemetry monitoring - Orthostatic vital signs ordered

## 2022-10-22 NOTE — ED Provider Notes (Signed)
[facility] Provider Note    Event Date/Time   First MD Initiated Contact with Patient 10/22/22 [phone]     (approximate)   History   Multiple Complaints and Hypertension   HPI  Alexis Farmer is a 82 y.o. 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  Patient tells me that this morning she woke up and was sitting on her couch as she slept on the couch.  She was not able to see.  She then stood up and did feel nauseous and dizzy when still was not able to see.  Apparently the light was on.  This lasted for several minutes.  She tried to call 911 but was not able to see her phone.  Denies any associated numbness or weakness.  Did not try to talk during this episode.  Eventually vision started coming back.  Denies history of similar.  This is what made her call the ambulance but she also has several other more ongoing chronic complaints that she would like addressed.  She endorses a pain in the top of her head that has been rather constant and has been going on for several months as well as right sided tinnitus and a swishing feeling.  This comes and goes.  No other chronic vision change or double vision.  She also endorses numbness and tingling in bilateral hands and numbness and tingling pain that involves the bilateral hands arms and upper back/neck region.  Patient also endorses pain under her left breast.  [name] that about a month ago she had diffuse myalgias eventually the myalgias centered on the left breast/chest and this has been constant since then.  Denies dyspnea.     Past Medical History:  Diagnosis Date   Anxiety 09/13/2015   Arthritis    Atypical chest pain 01/15/2021   Blackhead 10/24/2021   Bradycardia 07/17/2016   Formatting of this note might be different from the original.  Last Assessment & Plan:   Found to be bradycardic at outside hospital. They stopped metoprolol and  verapamil and heart rate has been normal since then. Echo appears to have been reassuring at the outside facility. Appears to be doing much better with regards to this. She'll continue to monitor for recurrent symptoms. 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  Last Assessment & Plan:   Continues to have issues with this.  She is following with a specialist and it sounds as though they are planning on an MRI.  Benign exam today.   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 Loletha Carrow of this note might be different from the original.  Dr. Thedore Mins      Last Assessment & Plan:   Recheck kidney function today.   Constipation 11/17/2019   Cough 01/25/2016   Formatting of this note might be different from the original.  Last Assessment & Plan:   Minimal nighttime cough. Improves when she washes her pillows consistently. Suspect allergies contributing. She'll monitor.   Diabetes mellitus without complication (HCC)    one elevated reading/ no treatment   Disorder of rotator cuff 06/24/2018   Diverticulitis    Dysuria 03/22/2017   Formatting of this note might be different from the original.  Last Assessment & Plan:   Symptoms concerning for UTI.  Will check urinalysis.   Edema of foot 04/08/2014   Formatting of this note might be different from the original.  Last Assessment & Plan:   Chronic pedal edema. Suspect venous insufficiency. No orthopnea or shortness of breath. Advised elevation of her legs. Consider compression stockings in the future.   Elevated troponin 12/15/2020   Essential hypertension 04/08/2014   Formatting of this note might be different from the original.  Last Assessment & Plan:   Well-controlled on recheck.  Continue current regimen.   Fall 03/26/2018   Gastroenteritis 08/12/2021   Gastrointestinal hemorrhage 08/03/2015   GI bleed    High  cholesterol    History of blood transfusion    Hyperglycemia due to type 2 diabetes mellitus (HCC) 03/11/2015   Hypertension    Hypertensive urgency 12/15/2020   Low back pain 04/08/2014   Morbid obesity (HCC) 04/08/2014   Formatting of this note might be different from the original.  Last Assessment & Plan:   Weight is stable. Discussed diet and exercise at length. Encouraged whatever exercise she can do. Given diet information.   Palpitations 12/15/2020   Peripheral edema 12/22/2019   Postmenopausal bleeding 07/17/2016   Formatting of this note might be different from the original.  Last Assessment & Plan:   Recent D&C. Following with gynecology. Benign findings. Monitor for recurrence.   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  Last Assessment & Plan:   Continues to have issues with this.  She is following with a specialist and it sounds as though they are planning on an MRI.  Benign exam today.   Recurrent falls 08/13/2019   Renal insufficiency    Skin cyst 10/24/2021   Stage 3a chronic kidney disease (HCC) 01/15/2021   Swelling of lower leg 07/19/2016   Vertigo 10/13/2015   Formatting of this note might be different from the original.  Last Assessment & Plan:   No recurrence. Discussed that meclizine is an as needed medication and that she does not need to take this daily. She will continue to monitor.    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/2021   Panic attack 01/15/2021   Other chronic pain 08/25/2020   Hypertension associated with diabetes (HCC) 08/25/2020   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) 05/30/2020   Retinopathy 05/30/2020   Multinodular goiter 02/04/2020   Proteinuria 12/22/2019   Physical deconditioning 08/13/2019   Abdominal aortic atherosclerosis (HCC) 07/24/2019   Morbid obesity with BMI of 45.0-49.9, adult (HCC) 06/09/2019   Hypercalcemia 03/18/2019   Round hole, unspecified eye 02/06/2019   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   Abnormal gait 03/26/2018   Arthritis 03/26/2018   Thyroid nodule 01/20/2018   Rectal hemorrhage 07/29/2016   Carotid artery stenosis 07/19/2016   PAD (peripheral artery disease) (HCC) 07/19/2016  Chronic diastolic CHF (congestive heart failure) (HCC) 05/31/2016   Chronic fatigue 11/15/2015   Depression, recurrent (HCC) 10/13/2015   Osteoarthritis 10/13/2015   Mixed hyperlipidemia 08/26/2014   Uncontrolled type 2 diabetes mellitus with hyperglycemia (HCC) 04/08/2014   Stage 2 chronic kidney disease 04/08/2014   Diabetic polyneuropathy (HCC) 04/08/2014   Type 2 diabetes mellitus with other diabetic kidney complication (HCC) 04/08/2014     Physical Exam  Triage Vital Signs: ED Triage Vitals  Enc Vitals Group     BP 10/22/22 0513 (!) 216/78     Pulse Rate 10/22/22 0513 81     Resp 10/22/22 0513 18     Temp 10/22/22 0513 99.5 F (37.5 C)     Temp Source 10/22/22 0513 Oral     SpO2 10/22/22 0513 97 %     Weight 10/22/22 0513 245 lb (111.1 kg)     Height 10/22/22 0511 5\' 3"  (1.6 m)     Head Circumference --      Peak Flow --      Pain Score 10/22/22 0513 3     Pain Loc --      Pain Edu? --      Excl. in GC? --     Most  recent vital signs: Vitals:   10/22/22 1000 10/22/22 1246  BP: 109/80   Pulse: 72   Resp: 18   Temp:  98.5 F (36.9 C)  SpO2: 95%      General: Awake, no distress. CV:  Good peripheral perfusion.  Resp:  Normal effort.  Abd:  No distention. Tenderness throughout the upper abdomen Neuro:             Awake, Alert, Oriented x 3  Other:  Aox3, nml speech  PERRL, EOMI, face symmetric, nml tongue movement  5/5 strength in the BL upper and lower extremities  Sensation grossly intact in the BL upper and lower extremities  Finger-nose-finger intact BL  2+ radial pulses BL upper extremities   L LE swelling and erythema, tells me this is chronic    ED Results / Procedures / Treatments  Labs (all labs ordered are listed, but only abnormal results are displayed) Labs Reviewed  CBC WITH DIFFERENTIAL/PLATELET - Abnormal; Notable for the following components:      Result Value   Lymphs Abs 5.7 (*)    All other components within normal limits  COMPREHENSIVE METABOLIC PANEL - Abnormal; Notable for the following components:   Glucose, Bld 266 (*)    BUN 35 (*)    Creatinine, Ser 1.52 (*)    Calcium 10.4 (*)    GFR, Estimated 34 (*)    All other components within normal limits  URINALYSIS, ROUTINE W REFLEX MICROSCOPIC - Abnormal; Notable for the following components:   Color, Urine STRAW (*)    APPearance CLEAR (*)    Glucose, UA 150 (*)    Hgb urine dipstick SMALL (*)    Protein, ur 100 (*)    Leukocytes,Ua TRACE (*)    All other components within normal limits  TROPONIN I (HIGH SENSITIVITY) - Abnormal; Notable for the following components:   Troponin I (High Sensitivity) 21 (*)    All other components within normal limits  LIPASE, BLOOD  TROPONIN I (HIGH SENSITIVITY)     EKG  EKG reviewed interpreted myself shows sinus rhythm with first-degree AV block, T wave inversions 1 and aVL, wide QRS no acute ischemic changes, similar to  prior   RADIOLOGY    PROCEDURES:  Critical Care performed: Yes, see critical care procedure note(s)  Procedures  The patient is on the cardiac monitor to evaluate for evidence of arrhythmia and/or significant heart rate changes.   MEDICATIONS ORDERED IN ED: Medications  oxyCODONE-acetaminophen (PERCOCET/ROXICET) 5-325 MG per tablet 1 tablet (1 tablet Oral Given 10/22/22 0859)  iohexol (OMNIPAQUE) 350 MG/ML injection 60 mL (60 mLs Intravenous Contrast Given 10/22/22 0903)  ondansetron (ZOFRAN) injection 4 mg (4 mg Intravenous Given 10/22/22 1146)  morphine (PF) 4 MG/ML injection 4 mg (4 mg Intravenous Given 10/22/22 1229)     IMPRESSION / MDM / ASSESSMENT AND PLAN / ED COURSE  I reviewed the triage vital signs and the nursing notes.                              Patient's presentation is most consistent with acute presentation with potential threat to life or bodily function.  Differential diagnosis includes, but is not limited to, TIA, CVA, vasovagal episode orthostatic hypotension, symptomatic vertebral or cervical artery dissection, neuropathy  The patient is a 82 year old female who presents with several issues.  Today when she got up she was not able to see from the bilateral eyes.  This persisted upon standing and she felt lightheaded and dizzy like she was going to pass out.  She tried to call 911 but was not initially able to because she could not see.  Lasted for several minutes and then resolved.  In addition to this she complains of several months of right-sided head pain pain that goes from the right arm to her back to the left arm as well as numbness and tingling and pain in the bilateral hands.  She also endorses pain under the left breast some pain in the upper abdomen.  Patient is tearful and very worried about going home.  Initially she is hypertensive.  Her neurologic exam is nonfocal.  She has good radial pulses.  Does have tenderness in the upper abdomen.  Has asymmetric  left lower extremity swelling which she tells me is chronic.   Labs are notable for normal CBC, CMP with stable renal function.  Initial troponin is 16 and on repeat it is 21.  Lipase negative.  I am suspicious that the visual symptoms were due to blood pressure possibly being low and a component of vasovagal presyncope especially given the dizziness she felt over TIA could cause bilateral cortical blindness.  This with her neck pain and right-sided headache did push me to get a CTA of the head and neck.  Also plan to obtain a CT of the abdomen pelvis and Doppler of the left lower extremity given asymmetric swelling.   CT angio of the head and neck does show 60-70 right stenosis of the right carotid bifurcation, as well as multiple other atherosclerotic lesions.  I did discuss with Dr. Selina Cooley with neurology who did not feel that the bilateral vision loss was consistent with TIA and thus did not feel that she required any additional treatment for these.   CT of the abdomen pelvis is negative for acute abnormality.  Venous ultrasound of left lower extremity is negative for clot.  On reassessment patient continues to complain of right-sided head pain and she is tearful and very afraid about going home because she is worried about feeling the dizziness that she felt before.  Will thus admit for observation for syncope.  Plan to discuss with hospitalist.  FINAL CLINICAL IMPRESSION(S) / ED DIAGNOSES   Final diagnoses:  Postural dizziness with presyncope     Rx / DC Orders   ED Discharge Orders     None        Note:  This document was prepared using Dragon voice recognition software and may include unintentional dictation errors.   Georga Hacking, MD 10/22/22 [phone]

## 2022-10-22 NOTE — ED Notes (Signed)
Patient ambulated with walker to restroom without difficulties.

## 2022-10-22 NOTE — Assessment & Plan Note (Signed)
History of severe hypertension that has been difficult to control.  Patient is on several antihypertensives at home.  Will resume at this time.  - Restart home clonidine, hydralazine, Bystolic, spironolactone, and valsartan

## 2022-10-22 NOTE — Assessment & Plan Note (Signed)
Renal function is stable at this time.  Will continue to monitor while admitted.  - Repeat BMP in the a.m.

## 2022-10-22 NOTE — ED Notes (Signed)
US at bedside

## 2022-10-22 NOTE — ED Triage Notes (Signed)
FIRST NURSE NOTE:  Pt arrived via [facility] from home, with c/o vision changes, ringing in ears, normally has ringing to ears, but worse when waking up, pt c/o normal neck and back pain.  Elevated BP with EMS- 192/88  Denies CP with EMS  97% RA P-76 EKG-NSR  Hx of HTN on meds, reports compliance

## 2022-10-22 NOTE — ED Notes (Signed)
Patient to CT at this time

## 2022-10-22 NOTE — Assessment & Plan Note (Signed)
Minimal hypervolemia on exam with +1 bilateral pitting edema but no evidence of pulmonary edema.  - Restart home torsemide

## 2022-10-22 NOTE — ED Triage Notes (Signed)
See first nurse note. Pt adds no additional information in triage.

## 2022-10-23 DIAGNOSIS — I10 Essential (primary) hypertension: Secondary | ICD-10-CM | POA: Diagnosis not present

## 2022-10-23 DIAGNOSIS — I5032 Chronic diastolic (congestive) heart failure: Secondary | ICD-10-CM | POA: Diagnosis not present

## 2022-10-23 DIAGNOSIS — H543 Unqualified visual loss, both eyes: Secondary | ICD-10-CM | POA: Diagnosis not present

## 2022-10-23 LAB — BASIC METABOLIC PANEL
Anion gap: 8 (ref 5–15)
BUN: 37 mg/dL — ABNORMAL HIGH (ref 8–23)
CO2: 24 mmol/L (ref 22–32)
Calcium: 9.5 mg/dL (ref 8.9–10.3)
Chloride: 101 mmol/L (ref 98–111)
Creatinine, Ser: 1.93 mg/dL — ABNORMAL HIGH (ref 0.44–1.00)
GFR, Estimated: 25 mL/min — ABNORMAL LOW (ref >60)
Glucose, Bld: 265 mg/dL — ABNORMAL HIGH (ref 70–99)
Potassium: 4.7 mmol/L (ref 3.5–5.1)
Sodium: 133 mmol/L — ABNORMAL LOW (ref 135–145)

## 2022-10-23 LAB — GLUCOSE, CAPILLARY
Glucose-Capillary: 217 mg/dL — ABNORMAL HIGH (ref 70–99)
Glucose-Capillary: 317 mg/dL — ABNORMAL HIGH (ref 70–99)
Glucose-Capillary: 365 mg/dL — ABNORMAL HIGH (ref 70–99)
Glucose-Capillary: 366 mg/dL — ABNORMAL HIGH (ref 70–99)

## 2022-10-23 LAB — BRAIN NATRIURETIC PEPTIDE: B Natriuretic Peptide: 163.3 pg/mL — ABNORMAL HIGH (ref 0.0–100.0)

## 2022-10-23 MED ORDER — LACTATED RINGERS IV SOLN: INTRAVENOUS | Status: DC

## 2022-10-23 MED ORDER — BUPROPION HCL ER (SR) 100 MG PO TB12: 100.0000 mg | ORAL_TABLET | Freq: Two times a day (BID) | ORAL | 2 refills | Status: DC

## 2022-10-23 NOTE — Evaluation (Signed)
Occupational Therapy Evaluation Patient Details Name: Ovida Mathe MRN: [id number] DOB: 1940-08-23 Today's Date: 10/23/2022   History of Present Illness Pt is an 82 y.o. female presenting to hospital 10/22/22 with "several issues.  Today when she got up she was not able to see from the bilateral eyes.  This persisted upon standing and she felt lightheaded and dizzy like she was going to pass out.  She tried to call 911 but was not initially able to because she could not see.  Lasted for several minutes and then resolved.  In addition to this she complains of several months of right-sided head pain pain that goes from the right arm to her back to the left arm as well as numbness and tingling and pain in the bilateral hands.  She also endorses pain under the left breast some pain in the upper abdomen".  Pt admitted with B vision loss, pre-syncope, and severe htn.  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   Clinical Impression   Patient presenting with decreased Ind in self care,balance, functional mobility/transfers, endurance, and safety awareness. Patient reports being mod I at home with use of rollator in the home and RW in community. PT does have family assistance with IADL tasks as well as public transportation for appointments as needed. Pt is supervision overall during session and reports going to bathroom independently this morning prior to therapist arrival. Pt endorsing increased pain in chest with coughing. Patient will benefit from acute OT to increase overall independence in the areas of ADLs, functional mobility, and safety awareness in order to safely discharge.     Recommendations for follow up therapy are one component of a multi-disciplinary discharge planning process, led by the attending physician.  Recommendations may be updated based on  patient status, additional functional criteria and insurance authorization.   Assistance Recommended at Discharge PRN  Patient can return home with the following A little help with bathing/dressing/bathroom;Assistance with cooking/housework;Assist for transportation;Help with stairs or ramp for entrance    Functional Status Assessment  Patient has had a recent decline in their functional status and demonstrates the ability to make significant improvements in function in a reasonable and predictable amount of time.  Equipment Recommendations  Tub/shower seat    Recommendations for Other Services       Precautions / Restrictions Restrictions Weight Bearing Restrictions: No      Mobility Bed Mobility               General bed mobility comments: Deferred (pt in recliner beginning/end of session)    Transfers Overall transfer level: Needs assistance Equipment used: Rolling walker (2 wheels) Transfers: Sit to/from Stand Sit to Stand: Modified independent (Device/Increase time)                  Balance Overall balance assessment: Needs assistance Sitting-balance support: No upper extremity supported, Feet supported Sitting balance-Leahy Scale: Normal Sitting balance - Comments: steady sitting reaching outside BOS   Standing balance support: Bilateral upper extremity supported, During functional activity Standing balance-Leahy Scale: Good Standing balance comment: steady ambulating with RW use                           ADL either performed or assessed with clinical judgement   ADL Overall ADL's : Needs assistance/impaired  General ADL Comments: supervision overall for safety with RW     Vision Patient Visual Report: No change from baseline              Pertinent Vitals/Pain Pain Assessment Pain Assessment: 0-10 Pain Score: 8  Pain Location: under B breasts Pain Descriptors / Indicators:  Shooting Pain Intervention(s): Limited activity within patient's tolerance, Monitored during session, Repositioned     Hand Dominance Right   Extremity/Trunk Assessment Upper Extremity Assessment Upper Extremity Assessment: Overall WFL for tasks assessed   Lower Extremity Assessment Lower Extremity Assessment: Generalized weakness       Communication Communication Communication: No difficulties   Cognition Arousal/Alertness: Awake/alert Behavior During Therapy: WFL for tasks assessed/performed Overall Cognitive Status: Within Functional Limits for tasks assessed                                                  Home Living Family/patient expects to be discharged to:: Private residence Living Arrangements: Alone   Type of Home: Apartment Home Access: Stairs to enter [employer] of Steps: 1 Entrance Stairs-Rails: None Home Layout: One level     Bathroom Shower/Tub: Chief Strategy Officer: Standard     Home Equipment: Agricultural consultant (2 wheels);Rollator (4 wheels);BSC/3in1;Adaptive equipment Adaptive Equipment: Sock aid Additional Comments: Pt reports holding onto [facility] handles to get into shower and putting old shirts in tub to prevent slipping.      Prior Functioning/Environment               Mobility Comments: Pt uses rollator within home and uses RW outside of home.  Granddaughter/family assist pt up step into home (pt reports able to go down step by herself).  Pt reports no recent falls.  Sits on rollator when cooking. ADLs Comments: Granddaughter assists with some IADL tasks as she no longer drives. Pt endorses performing self care independently        OT Problem List: Decreased activity tolerance;Decreased safety awareness;Impaired balance (sitting and/or standing)      OT Treatment/Interventions: Self-care/ADL training;Therapeutic exercise;Therapeutic activities;Energy conservation;DME and/or AE  instruction;Patient/family education;Balance training    OT Goals(Current goals can be found in the care plan section) Acute Rehab OT Goals Patient Stated Goal: to return home OT Goal Formulation: With patient Time For Goal Achievement: 11/06/22 Potential to Achieve Goals: Good ADL Goals Pt Will Perform Grooming: with modified independence;standing Pt Will Perform Lower Body Dressing: with modified independence;sit to/from stand Pt Will Transfer to Toilet: with modified independence;ambulating Pt Will Perform Toileting - Clothing Manipulation and hygiene: with modified independence;sit to/from stand  OT Frequency: Min 1X/week       AM-PAC OT "6 Clicks" Daily Activity     Outcome Measure Help from another person eating meals?: None Help from another person taking care of personal grooming?: None Help from another person toileting, which includes using toliet, bedpan, or urinal?: A Little Help from another person bathing (including washing, rinsing, drying)?: A Little Help from another person to put on and taking off regular upper body clothing?: None Help from another person to put on and taking off regular lower body clothing?: A Little 6 Click Score: 21   End of Session Equipment Utilized During Treatment: Rolling walker (2 wheels) Nurse Communication: Mobility status  Activity Tolerance: Patient tolerated treatment well Patient left: in chair;with nursing/sitter in room;with call bell/phone  within reach  OT Visit Diagnosis: Muscle weakness (generalized) (M62.81);Unsteadiness on feet (R26.81)                Time: 1610-9604 OT Time Calculation (min): 17 min Charges:  OT General Charges $OT Visit: 1 Visit OT Evaluation $OT Eval Low Complexity: 1 Low OT Treatments $Therapeutic Activity: 8-22 mins  Jackquline Denmark, MS, OTR/L , CBIS ascom [phone]  10/23/22, 11:24 AM

## 2022-10-23 NOTE — Progress Notes (Signed)
Discharge instructions reviewed with patient including followup visits and new medications.  Understanding was verbalized and all questions were answered.  IV removed without complication; patient tolerated well.  Patient awaiting ride. 

## 2022-10-23 NOTE — Discharge Summary (Signed)
Physician Discharge Summary   Patient: Alexis Farmer MRN: [id number] DOB: 01-Apr-1941  Admit date:     10/22/2022  Discharge date: 10/23/22  Discharge Physician: Arnetha Courser   PCP: [name], MD   Recommendations at discharge:  Please obtain CBC and BMP within a week Patient is being started on Wellbutrin-need titration of dose according to her response. Please consider adding Ozempic as it will help with her weight and A1c Patient might get benefit from follow-up with vascular surgery due to noted some plaque buildup in her carotid and also has an history of subclavian steal syndrome. Follow-up with primary care provider Follow-up with oncology according to her appointment  Discharge Diagnoses: Principal Problem:   Vision loss, bilateral Active Problems:   Pre-syncope   Severe hypertension   Type 2 diabetes mellitus with other diabetic kidney complication (HCC)   Chronic diastolic CHF (congestive heart failure) (HCC)   Hypercalcemia   Stage 3b chronic kidney disease (HCC)   Monoclonal B-cell lymphocytosis of undetermined significance   Hospital Course: Taken from H&P.  Alexis Farmer is a 82 y.o. 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   Per patient she was not feeling well over the last several months, including experiencing night sweats and weight loss.  Over the past 1 week her symptoms got worse with occasional confusion, on the day of admission she woke up due to bilateral shoulder pain and realized that she was unable to see anything.  Her vision later started to slowly come back.  Dizziness also gradually resolved.  ED course.  On arrival she was hypertensive at 216/78, afebrile.  Blood pressure spontaneously improved  later on. Initial workup notable for normal CBC, and CMP with baseline renal function with BUN of 35 and creatinine of 1.52 and GFR of 34.  Troponin within normal limits with flat trend to 21.  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   CT of the abdomen was obtained that demonstrated no acute intra-abdominal pathology.   Left lower extremity Doppler obtained that was negative for DVT.   Chest x-ray was obtained that demonstrated 1.1 cm indeterminate nodular opacity in the left upper lobe, but no other abnormalities.    Patient has a recent outpatient flow cytometry done on 10/10/2022 notable for monoclonal B-cell population with nonspecific phenotype.  She is currently undergoing lymphoma/leukemia workup which is causing a lot of anxiety and depression.  MRI was ordered but patient refused.  Case was discussed with neurology and they does not think this is TIA.  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  5/7: Hemodynamically stable.  Labs with elevated CBG, A1c done yesterday was 9.5 and slight increase in creatinine but still within baseline.  Patient has significant lower extremity edema, BNP of 163 but patient also has CKD.  PT and OT recommending home health which was ordered.  Patient is on multiple antihypertensives not sure how much she was taking as she appears to have poor health literacy.  She apparently missed her oncology appointment due to transportation issues and it was rescheduled for May 15.  She  is very anxious that what will happen if she has some sort of cancer.  I told the patient that I will let that discussion to be done by her oncologist.  She can use as needed Ativan for anxiety. She was also started on  Wellbutrin to help with anxiety and depression, her primary care provider should be able to titrate the dose.  Diabetes education was also provided.  She can see a vascular surgeon as outpatient to discuss the results of CTA.  Patient will continue on her current medications and need to have a close follow-up with her providers for further recommendations.  Discussed with granddaughter on phone and she agrees with the plan.    Assessment and Plan: * Vision loss, bilateral Patient is presenting with sudden onset bilateral vision loss that occurred this morning that gradually improved.  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  Given symptoms were bilateral, unlikely that this is contributing. EDP discussed with Dr. Selina Cooley, who did not feel symptoms were consistent with TIA.  I discussed with patient my plan to obtain an MRI for completion of workup, however she declined. 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  Symptoms have resolved at this time.  Pre-syncope Patient endorses dizziness when standing this morning that is most consistent with orthostasis, however given history of palpitations, EKG with first-degree AV block and reported history of elevated heart rate over the last week, will monitor on telemetry overnight.  - Telemetry monitoring - Orthostatic vital signs ordered  Severe hypertension History of severe hypertension that has been difficult to control.  Patient is on several antihypertensives at home.  Will resume at this time.  - Restart home clonidine, hydralazine, Bystolic, spironolactone, and valsartan  Monoclonal B-cell lymphocytosis of undetermined significance Patient was recently noted to have B-cell lymphocytosis of undetermined significance.  She has a  follow-up with oncology coming up in 9 days.  - Outpatient follow-up with oncology  Stage 3b chronic kidney disease (HCC) Renal function is stable at this time.  Will continue to monitor while admitted.  Hypercalcemia Minimal and stable at this time.  Unlikely to be contributing to current symptoms  Chronic diastolic CHF (congestive heart failure) (HCC) Minimal hypervolemia on exam with +1 bilateral pitting edema but no evidence of pulmonary edema.   Type 2 diabetes mellitus with other diabetic kidney complication (HCC) Last A1c of 9.7% in January 2024.  Follows with Endocrinology  - SSI, resistant - Semglee 30 units at bedtime   Consultants: Curbside neurology Procedures performed: None Disposition: Home health Diet recommendation:  Discharge Diet Orders (From admission, onward)     Start     Ordered   10/23/22 0000  Diet - low sodium heart healthy        10/23/22 1139           Cardiac and Carb modified diet DISCHARGE MEDICATION: Allergies as of 10/23/2022       Reactions   Celexa [citalopram]    Diarrhea upset stomach   Jardiance [empagliflozin] Other (See Comments)   Reaction not recalled   Norvasc [amlodipine]    Leg edema   Tape Other (See Comments)   Leaves the skin "raw" if left on for a period of time- tolerates paper tape   Penicillin V Rash   Penicillin V Potassium Rash   Penicillins Rash  Has patient had a PCN reaction causing immediate rash, facial/tongue/throat swelling, SOB or lightheadedness with hypotension: Yes Has patient had a PCN reaction causing severe rash involving mucus membranes or skin necrosis: No Has patient had a PCN reaction that required hospitalization No Has patient had a PCN reaction occurring within the last 10 years: Yes If all of the above answers are "NO", then may proceed with Cephalosporin use.        Medication List     STOP taking these medications    gabapentin 300 MG capsule Commonly known as: NEURONTIN    Valium 5 MG tablet Generic drug: diazepam       TAKE these medications    Accu-Chek Guide test strip Generic drug: glucose blood USE AS INSTRUCTED 3 TIMES DAILY   acetaminophen 500 MG tablet Commonly known as: TYLENOL Take 500 mg by mouth every 6 (six) hours as needed for mild pain or headache.   aspirin EC 81 MG tablet Take 81 mg by mouth 2 (two) times a week. Swallow whole.   BD Insulin Syringe U/F 30G X 1/2" 0.5 ML Misc Generic drug: Insulin Syringe-Needle U-100 USE UP TO 5X PER DAY WITH INSULIN 2X (NPH) AND 3X (REGULAR)   blood glucose meter kit and supplies Kit Accu chek, Dx code E11.65, check 3 times daily   Blood Pressure Kit 1 Device by Does not apply route daily.   buPROPion ER 100 MG 12 hr tablet Commonly known as: Wellbutrin SR Take 1 tablet (100 mg total) by mouth 2 (two) times daily.   Centrum Silver 50+Women Tabs Take 1 tablet by mouth daily with breakfast.   cloNIDine 0.2 MG tablet Commonly known as: CATAPRES Take 1 tablet (0.2 mg total) by mouth 2 (two) times daily.   hydrALAZINE 100 MG tablet Commonly known as: APRESOLINE TAKE 1 TABLET (100 MG TOTAL) BY MOUTH 4 (FOUR) TIMES DAILY.   insulin NPH Human 100 UNIT/ML injection Commonly known as: NovoLIN N INJECT 20 UNITS IN THE MORNING AND THE EVENING WITH A MEAL What changed:  how much to take when to take this additional instructions   insulin regular 100 units/mL injection Commonly known as: NovoLIN R Inject 0-0.08 mLs (0-8 Units total) into the skin 3 (three) times daily before meals.   LORazepam 1 MG tablet Commonly known as: ATIVAN Take 0.5 tablets (0.5 mg total) by mouth daily as needed for anxiety. Inform pt 0.5 dose on backorder has to cut this in 1/2 dose   nebivolol 10 MG tablet Commonly known as: Bystolic Take 1 tablet (10 mg total) by mouth daily.   nystatin powder Commonly known as: MYCOSTATIN/NYSTOP Apply topically 2 (two) times daily. Under abdomen   polyethylene glycol  powder 17 GM/SCOOP powder Commonly known as: GLYCOLAX/MIRALAX Take 17 g by mouth daily as needed for moderate constipation or severe constipation. Can take up to 2x per day prn. Mix with 8 ounces of liquid   rosuvastatin 20 MG tablet Commonly known as: CRESTOR TAKE 1 TABLET BY MOUTH EVERY DAY   spironolactone 25 MG tablet Commonly known as: ALDACTONE Take 1 tablet (25 mg total) by mouth daily.   torsemide 10 MG tablet Commonly known as: DEMADEX Take 1 tablet (10 mg total) by mouth daily.   valsartan 160 MG tablet Commonly known as: DIOVAN Take 1 tablet (160 mg total) by mouth daily.   VITAMIN C PO Take 1 tablet by mouth daily.        Follow-up Information     Clent Ridges,  Kenney Houseman, MD. Schedule an appointment as soon as possible for a visit in 1 week(s).   Specialty: Family Medicine Contact information: [address] [place] [address] [phone]         Annice Needy, MD. Schedule an appointment as soon as possible for a visit in 1 week(s).   Specialties: Vascular Surgery, Radiology, Interventional Cardiology Contact information: [address] [place] [address] [phone]                Discharge Exam: Ceasar Mons Weights   10/22/22 0513 10/22/22 1600  Weight: 111.1 kg 110.2 kg   General.  Morbidly obese elderly lady, in no acute distress. Pulmonary.  Lungs clear bilaterally, normal respiratory effort. CV.  Regular rate and rhythm, no JVD, rub or murmur. Abdomen.  Soft, nontender, nondistended, BS positive. CNS.  Alert and oriented .  No focal neurologic deficit. Extremities.  No edema, no cyanosis, pulses intact and symmetrical. Psychiatry.  Judgment and insight appears normal.   Condition at discharge: stable  The results of significant diagnostics from this hospitalization (including imaging, microbiology, ancillary and laboratory) are listed below for reference.   Imaging Studies: US Venous Img Lower Unilateral Left  Result Date:  10/22/2022 CLINICAL DATA:  Left lower extremity pain and edema. Evaluate for DVT. EXAM: LEFT LOWER EXTREMITY VENOUS DOPPLER ULTRASOUND TECHNIQUE: Gray-scale sonography with graded compression, as well as color Doppler and duplex ultrasound were performed to evaluate the lower extremity deep venous systems from the level of the common femoral vein and including the common femoral, femoral, profunda femoral, popliteal and calf veins including the posterior tibial, peroneal and gastrocnemius veins when visible. The superficial great saphenous vein was also interrogated. Spectral Doppler was utilized to evaluate flow at rest and with distal augmentation maneuvers in the common femoral, femoral and popliteal veins. COMPARISON:  None Available. FINDINGS: Contralateral Common Femoral Vein: Respiratory phasicity is normal and symmetric with the symptomatic side. No evidence of thrombus. Normal compressibility. Common Femoral Vein: No evidence of thrombus. Normal compressibility, respiratory phasicity and response to augmentation. Saphenofemoral Junction: No evidence of thrombus. Normal compressibility and flow on color Doppler imaging. Profunda Femoral Vein: No evidence of thrombus. Normal compressibility and flow on color Doppler imaging. Femoral Vein: No evidence of thrombus. Normal compressibility, respiratory phasicity and response to augmentation. Popliteal Vein: No evidence of thrombus. Normal compressibility, respiratory phasicity and response to augmentation. Calf Veins: Not well visualized. Superficial Great Saphenous Vein: No evidence of thrombus. Normal compressibility. Other Findings: There is a minimal amount of subcutaneous edema at the level of the left calf. IMPRESSION: No evidence of DVT within the left lower extremity. Electronically Signed   By: Simonne Come M.D.   On: 10/22/2022 10:03   CT ANGIO HEAD NECK W WO CM  Result Date: 10/22/2022 CLINICAL DATA:  Vision changes and ringing in the ears. EXAM: CT  ANGIOGRAPHY HEAD AND NECK WITH AND WITHOUT CONTRAST TECHNIQUE: Multidetector CT imaging of the head and neck was performed using the standard protocol during bolus administration of intravenous contrast. Multiplanar CT image reconstructions and MIPs were obtained to evaluate the vascular anatomy. Carotid stenosis measurements (when applicable) are obtained utilizing NASCET criteria, using the distal internal carotid diameter as the denominator. RADIATION DOSE REDUCTION: This exam was performed according to the departmental dose-optimization program which includes automated exposure control, adjustment of the mA and/or kV according to patient size and/or use of iterative reconstruction technique. CONTRAST:  60mL OMNIPAQUE IOHEXOL 350 MG/ML SOLN COMPARISON:  None Available. FINDINGS: CT  HEAD FINDINGS Brain: There is no acute intracranial hemorrhage, extra-axial fluid collection, or acute territorial infarct. Parenchymal volume is normal for age. The ventricles are normal in size. Gray-white differentiation is preserved. Hypodensity in the supratentorial white matter likely reflects sequela of underlying chronic small-vessel ischemic change. The pituitary and suprasellar region are normal. There is no mass lesion there is no mass effect or midline shift. Vascular: See below. Skull: Normal. Negative for fracture or focal lesion. Sinuses/Orbits: The paranasal sinuses are clear. The mastoid air cells and middle ear cavities are clear. Bilateral lens implants are in place. The globes and orbits are otherwise unremarkable. Other: None. Review of the MIP images confirms the above findings CTA NECK FINDINGS Aortic arch: There is calcified plaque in the aortic arch. 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 The origins of the major branch vessels are patent. Right carotid system: The right common carotid artery is patent.  There is bulky plaque in the proximal internal carotid artery resulting in up to approximately 60-70% stenosis. The distal internal carotid artery is patent. The external carotid artery is patent. There is no evidence of dissection or aneurysm. Left carotid system: The left common carotid artery is patent with mild plaque. There is mild plaque at the bifurcation and in the proximal internal carotid artery without hemodynamically significant stenosis or occlusion. The external carotid artery is patent. There is no evidence of dissection or aneurysm. Vertebral arteries: The vertebral arteries are patent, without hemodynamically significant stenosis or occlusion there is no evidence of dissection or aneurysm. Skeleton: There is no acute osseous abnormality or suspicious osseous lesion. There is no visible canal hematoma. Other neck: The soft tissues of the neck are unremarkable. Upper chest: The imaged lung apices are clear. Review of the MIP images confirms the above findings CTA HEAD FINDINGS Anterior circulation: There is calcified plaque in the intracranial ICAs resulting mild-to-moderate stenosis of the supraclinoid segments. The bilateral MCAs are patent, without proximal stenosis or occlusion. The bilateral ACAs are patent, without proximal stenosis or occlusion. The anterior communicating artery is normal. There is no aneurysm or AVM. Posterior circulation: There is mild plaque in the bilateral V4 segments resulting in up to mild stenosis on the right. The basilar artery is patent. The major cerebellar arteries appear patent. The bilateral PCAs are patent, with focal mild-to-moderate stenosis of the distal left P1 segment. There is no other proximal stenosis or occlusion. There is no aneurysm or AVM. Venous sinuses: Patent. Anatomic variants: None. Review of the MIP images confirms the above findings IMPRESSION: 1. No acute intracranial pathology on initial noncontrast head CT. 2. Bulky plaque at the right  carotid bifurcation resulting in approximately 60-70% stenosis of the proximal internal carotid artery. Mild plaque at the left carotid bifurcation without significant stenosis. 3. Intracranial atherosclerotic disease resulting in mild-to-moderate stenosis of both supraclinoid ICAs, and mild stenosis of the right V4 and left P1 segments. 4. 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 5. 2.4 cm heterogeneous left thyroid nodule. Consider further evaluation with nonemergent thyroid ultrasound as indicated given patient age. Electronically Signed   By: Lesia Hausen M.D.   On: 10/22/2022 10:03   CT ABDOMEN PELVIS WO CONTRAST  Result Date: 10/22/2022 CLINICAL DATA:  Abdominal pain.  Nonlocalized. EXAM: CT ABDOMEN AND PELVIS WITHOUT CONTRAST TECHNIQUE: Multidetector CT imaging of the abdomen and pelvis  was performed following the standard protocol without IV contrast. RADIATION DOSE REDUCTION: This exam was performed according to the departmental dose-optimization program which includes automated exposure control, adjustment of the mA and/or kV according to patient size and/or use of iterative reconstruction technique. COMPARISON:  08/12/2021 FINDINGS: Lower chest: No acute abnormality. Similar appearance of subpleural scarring and architectural distortion within the paravertebral right lung base. No pleural fluid or acute airspace disease. Hepatobiliary: No focal liver abnormality. Status post cholecystectomy. No bile duct dilatation. Pancreas: Unremarkable. No pancreatic ductal dilatation or surrounding inflammatory changes. Spleen: Normal in size without focal abnormality. Adrenals/Urinary Tract: Normal adrenal glands. No nephrolithiasis, hydronephrosis or suspicious mass. Urinary bladder is unremarkable. Stomach/Bowel: Stomach appears normal. The patient is status post appendectomy. No pathologic dilatation of the large or small bowel loops. No  bowel wall thickening or inflammation. Vascular/Lymphatic: Aortic atherosclerosis. No abdominopelvic adenopathy. Reproductive: Uterus and bilateral adnexa are unremarkable. Other: No ascites or focal fluid collections. Musculoskeletal: No acute or significant osseous findings. There is a first degree anterolisthesis of L4 on L5. Multi level disc space narrowing and endplate spurring noted. Bilateral facet arthropathy. IMPRESSION: 1. No acute findings within the abdomen or pelvis. 2. Status post cholecystectomy and appendectomy. 3. 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 4.  Aortic Atherosclerosis (ICD10-I70.0). Electronically Signed   By: Signa Kell M.D.   On: 10/22/2022 09:54   DG Chest Portable 1 View  Result Date: 10/22/2022 CLINICAL DATA:  Chest pain. EXAM: PORTABLE CHEST 1 VIEW COMPARISON:  08/14/2021 FINDINGS: Stable mild cardiac enlargement. Aortic atherosclerotic calcifications. Pulmonary vascular congestion. No airspace consolidation. 1.1 cm indeterminate nodular opacity within the periphery of the left upper lobe. The visualized osseous structures are unremarkable. IMPRESSION: 1. Mild cardiac enlargement and pulmonary vascular congestion. 2. 1.1 cm indeterminate nodular opacity within the periphery of the left upper lobe. Consider further evaluation with nonemergent CT of the chest. Electronically Signed   By: Signa Kell M.D.   On: 10/22/2022 05:38    Microbiology: Results for orders placed or performed during the hospital encounter of 08/12/21  Resp Panel by RT-PCR (Flu A&B, Covid) Nasopharyngeal Swab     Status: None   Collection Time: 08/12/21  3:00 PM   Specimen: Nasopharyngeal Swab; Nasopharyngeal(NP) swabs in vial transport medium  Result Value Ref Range Status   SARS Coronavirus 2 by RT PCR NEGATIVE NEGATIVE Final    Comment: (NOTE) SARS-CoV-2 target nucleic acids are NOT DETECTED.  The SARS-CoV-2 RNA is generally detectable in upper  respiratory specimens during the acute phase of infection. The lowest concentration of SARS-CoV-2 viral copies this assay can detect is 138 copies/mL. A negative result does not preclude SARS-Cov-2 infection and should not be used as the sole basis for treatment or other patient management decisions. A negative result may occur with  improper specimen collection/handling, submission of specimen other than nasopharyngeal swab, presence of viral mutation(s) within the areas targeted by this assay, and inadequate number of viral copies(<138 copies/mL). A negative result must be combined with clinical observations, patient history, and epidemiological information. The expected result is Negative.  Fact Sheet for Patients:  BloggerCourse.com  Fact Sheet for Healthcare Providers:  SeriousBroker.it  This test is no t yet approved or cleared by the [place] FDA and  has been authorized for detection and/or diagnosis of SARS-CoV-2 by FDA under an Emergency Use Authorization (EUA). This EUA will remain  in effect (meaning this test can be used) for the duration of the COVID-19  declaration under Section 564(b)(1) of the Act, 21 U.S.C.section 360bbb-3(b)(1), unless the authorization is terminated  or revoked sooner.       Influenza A by PCR NEGATIVE NEGATIVE Final   Influenza B by PCR NEGATIVE NEGATIVE Final    Comment: (NOTE) The Xpert Xpress SARS-CoV-2/FLU/RSV plus assay is intended as an aid in the diagnosis of influenza from Nasopharyngeal swab specimens and should not be used as a sole basis for treatment. Nasal washings and aspirates are unacceptable for Xpert Xpress SARS-CoV-2/FLU/RSV testing.  Fact Sheet for Patients: BloggerCourse.com  Fact Sheet for Healthcare Providers: SeriousBroker.it  This test is not yet approved or cleared by the [place] FDA and has been  authorized for detection and/or diagnosis of SARS-CoV-2 by FDA under an Emergency Use Authorization (EUA). This EUA will remain in effect (meaning this test can be used) for the duration of the COVID-19 declaration under Section 564(b)(1) of the Act, 21 U.S.C. section 360bbb-3(b)(1), unless the authorization is terminated or revoked.  Performed at [facility], [address] [place], [place] [address]     Labs: CBC: Recent Labs  Lab 10/22/22 0512  WBC 10.3  NEUTROABS 3.4  HGB 12.9  HCT 40.3  MCV 90.0  PLT 258   Basic Metabolic Panel: Recent Labs  Lab 10/22/22 0512 10/23/22 0421  NA 135 133*  K 4.5 4.7  CL 100 101  CO2 23 24  GLUCOSE 266* 265*  BUN 35* 37*  CREATININE 1.52* 1.93*  CALCIUM 10.4* 9.5   Liver Function Tests: Recent Labs  Lab 10/22/22 0512  AST 32  ALT 30  ALKPHOS 41  BILITOT 1.1  PROT 7.9  ALBUMIN 4.3   CBG: Recent Labs  Lab 10/22/22 2041 10/22/22 2254 10/23/22 0334 10/23/22 0733 10/23/22 0927  GLUCAP 200* 165* 217* 317* 365*    Discharge time spent: greater than 30 minutes.  This record has been created using Conservation officer, historic buildings. Errors have been sought and corrected,but may not always be located. Such creation errors do not reflect on the standard of care.   Signed: Arnetha Courser, MD [facility] 10/23/2022

## 2022-10-23 NOTE — TOC Transition Note (Signed)
Transition of Care [facility]) - CM/SW Discharge Note   Patient Details  Name: Alexis Farmer MRN: [id number] Date of Birth: Nov 09, 1940  [facility] of [facility]) CM/SW Contact:  Allena Katz, LCSW Phone Number: 10/23/2022, 11:53 AM   Clinical Narrative:   Pt has orders to discharge. Pt requesting HH services and reports she has no preference. Orders placed for PT/OT/RN/AID. Barbara Cower [facility] accepted.           Patient Goals and CMS Choice      Discharge Placement                         Discharge Plan and Services Additional resources added to the After Visit Summary for                                       Social Determinants of Health (SDOH) Interventions SDOH Screenings   Food Insecurity: No Food Insecurity (10/22/2022)  Housing: Low Risk  (10/22/2022)  Transportation Needs: No Transportation Needs (10/22/2022)  Utilities: Not At Risk (10/22/2022)  Alcohol Screen: Low Risk  (05/05/2020)  Depression (PHQ2-9): Low Risk  (09/04/2022)  Financial Resource Strain: Low Risk  (09/28/2020)  Physical Activity: Inactive (05/05/2020)  Social Connections: Socially Isolated (08/23/2020)  Stress: Stress Concern Present (08/23/2020)  Tobacco Use: Low Risk  (10/22/2022)     Readmission Risk Interventions     No data to display

## 2022-10-23 NOTE — Hospital Course (Addendum)
Taken from H&P.  Alexis Farmer is a 82 y.o. 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   Per patient she was not feeling well over the last several months, including experiencing night sweats and weight loss.  Over the past 1 week her symptoms got worse with occasional confusion, on the day of admission she woke up due to bilateral shoulder pain and realized that she was unable to see anything.  Her vision later started to slowly come back.  Dizziness also gradually resolved.  ED course.  On arrival she was hypertensive at 216/78, afebrile.  Blood pressure spontaneously improved later on. Initial workup notable for normal CBC, and CMP with baseline renal function with BUN of 35 and creatinine of 1.52 and GFR of 34.  Troponin within normal limits with flat trend to 21.  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   CT of the abdomen was obtained that demonstrated no acute intra-abdominal pathology.   Left lower extremity Doppler obtained that was negative for DVT.   Chest x-ray was obtained that demonstrated 1.1 cm indeterminate nodular opacity in the left upper lobe, but no other abnormalities.    Patient has a recent outpatient flow cytometry done on 10/10/2022 notable for monoclonal B-cell population with nonspecific phenotype.  She is currently undergoing lymphoma/leukemia workup which is causing a lot of anxiety and depression.  MRI was ordered but patient refused.  Case was discussed with neurology and they does not  think this is TIA.  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  5/7: Hemodynamically stable.  Labs with elevated CBG, A1c done yesterday was 9.5 and slight increase in creatinine but still within baseline.  Patient has significant lower extremity edema, BNP of 163 but patient also has CKD.  PT and OT recommending home health which was ordered.  Patient is on multiple antihypertensives not sure how much she was taking as she appears to have poor health literacy.  She apparently missed her oncology appointment due to transportation issues and it was rescheduled for May 15.  She is very anxious that what will happen if she has some sort of cancer.  I told the patient that I will let that discussion to be done by her oncologist.  She can use as needed Ativan for anxiety. She was also started on Wellbutrin to help with anxiety and depression, her primary care provider should be able to titrate the dose.  Diabetes education was also provided.  She can see a vascular surgeon as outpatient to discuss the results of CTA.  Patient will continue on her current medications and need to have a close follow-up with her providers for further recommendations.  Discussed with granddaughter on phone and she agrees with the plan.

## 2022-10-23 NOTE — Inpatient Diabetes Management (Addendum)
Inpatient Diabetes Program Recommendations  AACE/ADA: New Consensus Statement on Inpatient Glycemic Control  Target Ranges:  Prepandial:   less than 140 mg/dL      Peak postprandial:   less than 180 mg/dL (1-2 hours)      Critically ill patients:  140 - 180 mg/dL    Latest Reference Range & Units 10/22/22 16:56 10/22/22 20:41 10/22/22 22:54 10/23/22 03:34 10/23/22 07:33  Glucose-Capillary 70 - 99 mg/dL 409 (H) 811 (H) 914 (H) 217 (H) 317 (H)    Latest Reference Range & Units 12/13/21 10:33 10/22/22 05:15  Hemoglobin A1C 4.8 - 5.6 % 9.6 (H) 9.5 (H)   Review of Glycemic Control  Diabetes history: DM2 Outpatient Diabetes medications: NPH 40 units BID, Regular 0-8 units TID Current orders for Inpatient glycemic control: Semglee 30 units QHS, Novolog 0-20 units TID with meals  Inpatient Diabetes Program Recommendations:    Insulin: Please consider increasing Semglee to 35 units QHS, adding Novolog 0-5 units QHS, and ordering Novolog 3 units TID with meals for meal coverage if patient eats at least 50% of meals.  HbgA1C: A1C 9.5% on 10/22/22 indicating an average glucose of 226 mg/dl over the past 2-3 months.  Addendum 10/23/22@12 :50-Spoke with patient and daughter at bedside regarding DM. Patient states that she takes NPH 40 units BID and Regular insulin with meals when needed. Patient notes that she sometimes forgets to take a dose of NPH because she may fall asleep and sometimes forgets the Regular with meals because she prefers to eat and then take the Regular insulin.  Patient states that her glucose is usually around 200 mg/dl. She notes that she frequently has hypoglycemia and has to eat or drink something to treat before she can actually check glucose because she gets so shaky she can't use the glucometer until after her sugar comes up. She also reports that when she feels low, she eats and drinks too much because she feels like she has to eat and drink more to get rid of the symptoms of  hypoglycemia. Patient states that she starts getting symptoms of hypoglycemia once her glucose gets less than 130 mg/dl. In talking with patient, she has a fear of hypoglycemia due to severe hypoglycemia she witnessed in her mother. She states she wants her glucose to be at least 200 mg/dl when she goes to bed at night. Discussed A1C of 9.5% on 10/22/22 indicating an average glucose of 226 mg/dl. Discussed hypoglycemia along with treatment and importance of making sure Dr. Gershon Crane is aware of every hypoglycemic episode she has. Encouraged patient to try to treat hypoglycemia with 15 grams of carbs to prevent glucose from increasing significantly after hypoglycemia. Encouraged patient to keep a detailed log of glucose values and insulin taken. Asked that she be sure Dr. Gershon Crane is aware of frequency of hypoglycemia and if she has to eat and treat hypoglycemia symptoms before she checks glucose to be sure to note that the value is following treatment for symptoms. Encouraged patient to get glucose tablets and have them available at bedside, in purse, vehicle, and other commonly used areas at home so she can use to treat hypoglycemia if needed. Inquired about prior use of continuous glucose monitoring sensors and patient states that she has tried to use them in the past but she is allergic to the adhesives. Explained that over the past year, that [employer] has changed adhesives on their sensors so she may want to consider using one again. She will discuss with Dr. Gershon Crane  if she decides to try CGM sensor again. Patient states she has everything at home she needs for DM control. Patient verbalized understanding of information discussed and has no questions at this time.  Thanks, Orlando Penner, RN, MSN, CDCES Diabetes Coordinator Inpatient Diabetes Program [phone] (Team Pager from 8am to 5pm)

## 2022-10-23 NOTE — Evaluation (Signed)
Physical Therapy Evaluation Patient Details Name: Alexis Farmer MRN: [id number] DOB: 08-16-1940 Today's Date: 10/23/2022  History of Present Illness  Pt is an 82 y.o. female presenting to hospital 10/22/22 with "several issues.  Today when she got up she was not able to see from the bilateral eyes.  This persisted upon standing and she felt lightheaded and dizzy like she was going to pass out.  She tried to call 911 but was not initially able to because she could not see.  Lasted for several minutes and then resolved.  In addition to this she complains of several months of right-sided head pain pain that goes from the right arm to her back to the left arm as well as numbness and tingling and pain in the bilateral hands.  She also endorses pain under the left breast some pain in the upper abdomen".  Pt admitted with B vision loss, pre-syncope, and severe htn.  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  Clinical Impression  Prior to hospital admission, pt was modified independent ambulating with 4ww within home and RW outside of home; lives alone in 1 level apt with 1 step to enter.  Currently pt is modified independent with transfers and ambulation 40 feet with RW use (pt declined to ambulate further d/t feeling tired and not feeling great in general--nurse notified).  Pt would currently benefit from skilled PT to address noted impairments and functional limitations (see below for any additional details).  Upon hospital discharge, pt would benefit from ongoing therapy (pt reports planning for HHPT).    Recommendations for follow up therapy are one component of a multi-disciplinary discharge planning process, led by the attending physician.  Recommendations may be updated based on patient status, additional functional criteria and insurance authorization.         Assistance Recommended at Discharge PRN  Patient can return home with the following  Help with stairs or ramp for entrance    Equipment Recommendations Rolling walker (2 wheels) (pt has RW at home already)  Recommendations for Other Services       Functional Status Assessment Patient has had a recent decline in their functional status and demonstrates the ability to make significant improvements in function in a reasonable and predictable amount of time.     Precautions / Restrictions Restrictions Weight Bearing Restrictions: No      Mobility  Bed Mobility               General bed mobility comments: Deferred (pt in recliner beginning/end of session)    Transfers Overall transfer level: Needs assistance Equipment used: Rolling walker (2 wheels) Transfers: Sit to/from Stand Sit to Stand: Modified independent (Device/Increase time)           General transfer comment: mild increased effort to stand from recliner up to RW; steady    Ambulation/Gait Ambulation/Gait assistance: Modified independent (Device/Increase time) Gait Distance (Feet): 40 Feet Assistive device: Rolling walker (2 wheels) Gait Pattern/deviations: Step-through pattern Gait velocity: decreased     General Gait Details: steady ambulating with RW use  Stairs Stairs:  (pt declined d/t not feeling well in general)          Wheelchair Mobility    Modified Rankin (Stroke Patients Only)       Balance Overall balance assessment: Needs assistance Sitting-balance support: No upper extremity supported, Feet supported Sitting balance-Leahy Scale: Normal Sitting balance -  Comments: steady sitting reaching outside BOS   Standing balance support: Bilateral upper extremity supported, During functional activity Standing balance-Leahy Scale: Good Standing balance comment: steady ambulating with RW use                             Pertinent Vitals/Pain Pain Assessment Pain  Assessment: 0-10 Pain Score: 9  Pain Location: under B breasts Pain Descriptors / Indicators: Aching Pain Intervention(s): Limited activity within patient's tolerance, Monitored during session, Repositioned, Other (comment) (RN notified) Pt reports having pain under B breasts for >1 month (nurse notified). Vitals (HR and O2 on room air) stable and WFL throughout treatment session.    Home Living Family/patient expects to be discharged to:: Private residence Living Arrangements: Alone   Type of Home: Apartment Home Access: Stairs to enter Entrance Stairs-Rails: None Entrance Stairs-Number of Steps: 1   Home Layout: One level Home Equipment: Agricultural consultant (2 wheels);Rollator (4 wheels);BSC/3in1      Prior Function               Mobility Comments: Pt uses rollator within home and uses RW outside of home.  Granddaughter/family assist pt up step into home (pt reports able to go down step by herself).  Pt reports no recent falls.  Sits on rollator when cooking.       Hand Dominance        Extremity/Trunk Assessment   Upper Extremity Assessment Upper Extremity Assessment: Overall WFL for tasks assessed    Lower Extremity Assessment Lower Extremity Assessment: Generalized weakness       Communication   Communication: No difficulties  Cognition Arousal/Alertness: Awake/alert Behavior During Therapy: WFL for tasks assessed/performed Overall Cognitive Status: Within Functional Limits for tasks assessed                                          General Comments  Nursing cleared pt for participation in physical therapy.  Pt agreeable to PT session.    Exercises     Assessment/Plan    PT Assessment Patient needs continued PT services  PT Problem List Decreased strength;Decreased activity tolerance;Decreased balance;Decreased mobility;Pain       PT Treatment Interventions DME instruction;Gait training;Stair training;Functional mobility  training;Therapeutic activities;Therapeutic exercise;Balance training;Patient/family education    PT Goals (Current goals can be found in the Care Plan section)  Acute Rehab PT Goals Patient Stated Goal: to improve strength and mobility PT Goal Formulation: With patient Time For Goal Achievement: 11/06/22 Potential to Achieve Goals: Good    Frequency Min 2X/week     Co-evaluation               AM-PAC PT "6 Clicks" Mobility  Outcome Measure Help needed turning from your back to your side while in a flat bed without using bedrails?: None Help needed moving from lying on your back to sitting on the side of a flat bed without using bedrails?: None Help needed moving to and from a bed to a chair (including a wheelchair)?: None Help needed standing up from a chair using your arms (e.g., wheelchair or bedside chair)?: None Help needed to walk in hospital room?: None Help needed climbing 3-5 steps with a railing? : A Little 6 Click Score: 23    End of Session   Activity Tolerance: Patient limited by fatigue Patient left: in chair;with call  bell/phone within reach Nurse Communication: Mobility status;Precautions;Other (comment) (pt's pain status) PT Visit Diagnosis: Other abnormalities of gait and mobility (R26.89);Muscle weakness (generalized) (M62.81);Pain    Time: 1610-9604 PT Time Calculation (min) (ACUTE ONLY): 15 min   Charges:   PT Evaluation $PT Eval Low Complexity: 1 Low         Merry Pond, PT 10/23/22, 10:31 AM

## 2022-10-24 ENCOUNTER — Ambulatory Visit (INDEPENDENT_AMBULATORY_CARE_PROVIDER_SITE_OTHER): Payer: Medicare Other | Admitting: Family Medicine

## 2022-10-24 ENCOUNTER — Encounter: Payer: Self-pay | Admitting: Family Medicine

## 2022-10-24 VITALS — BP 134/52 | HR 68 | Ht 63.0 in | Wt 251.0 lb

## 2022-10-24 DIAGNOSIS — Z794 Long term (current) use of insulin: Secondary | ICD-10-CM

## 2022-10-24 DIAGNOSIS — E1129 Type 2 diabetes mellitus with other diabetic kidney complication: Secondary | ICD-10-CM

## 2022-10-24 DIAGNOSIS — N1832 Chronic kidney disease, stage 3b: Secondary | ICD-10-CM

## 2022-10-24 DIAGNOSIS — G458 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: Secondary | ICD-10-CM

## 2022-10-24 DIAGNOSIS — F339 Major depressive disorder, recurrent, unspecified: Secondary | ICD-10-CM

## 2022-10-24 NOTE — Patient Instructions (Addendum)
It was a pleasure meeting you today. Thank you for allowing me to take part in your health care.  Our goals for today as we discussed include:  Follow up with Dr Elveria Rising 05/15- Diabetes doctor Check Blood work at that time with Endocrinology Follow up with Dr Cathie Hoops 05/15- Blood doctor Follow up with Dr Thedore Mins 05/29- Kidney doctor Follow up with Dr Blase Mess 06/11- Vascular doctor.  Has been changed from July   Please bring in all medications at your next visit.  If you have any questions or concerns, please do not hesitate to call the office at [phone].  I look forward to our next visit and until then take care and stay safe.  Regards,   [name], MD   [facility]

## 2022-10-24 NOTE — Progress Notes (Signed)
SUBJECTIVE:   Chief Complaint  Patient presents with   Hospitalization Follow-up    D/C 10/23/22 [facility]   HPI Patient presents to clinic for follow-up hospital visit.  Admitted to [facility] 05/06 for several months of not feeling well including experiencing night sweats and weight loss.  Symptoms had increased 1 week prior to admission, with occasional confusion, bilateral vision loss and dizziness that slowly resolved.  She was hypertensive in the ED, afebrile.  Initial blood work was unrevealing and at baseline.  Troponins negative and remained flat.  CTA head and neck noted for right but carotid bifurcation with approximately 60 to 70% stenosis, 2.4 cm left thyroid nodule.  CT abdomen negative for acute intra abdominal pathology, left lower extremity Doppler negative for DVT.  Chest x-ray notable for 1.1 cm indeterminant nodular opacity in the left upper lobe but no other abnormalities.  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  Patient was offered MRI but refused. She was admitted overnight for observation. 05/07.  Remained hemodynamically stable.  Blood work notable for a small increase in creatinine compared to baseline.  She showed significant lower extremity edema.  PT OT had recommended home health.  She was also started on Wellbutrin for anxiety and depression.  She was discharged home with home health and scheduled for PCP follow-up today.  She reports that she continues to have similar symptoms and feels as though she was discharged too early.  She continues to have poor appetite and weakness.  She feels that her diabetes is not well-controlled and is frustrated that had previously missed her oncology appointment.  She endorses readiness to go home today.  She endorses having endocrinology and oncology appointment scheduled May 15.    She follows with vascular surgery in [place].  Dr. Juanetta Gosling  and has a follow-up appointment July 2.  She is not interested in follow-up with vascular surgery and [place] as scheduled from hospital discharge.  Regarding her Wellbutrin, she reports she did not know she was prescribed this medication and has not picked up from pharmacy.   She was previously referred to psychiatry but canceled referral. She appears very frustrated today and is not interested in continuing discussion surrounding continuing medication.  PERTINENT PMH / PSH: 2 diabetes Hypertension CKD Multinodular goiter Monoclonal B-cell lymphocytosis of undetermined significance  OBJECTIVE:  BP (!) 134/52   Pulse 68   Ht 5\' 3"  (1.6 m)   Wt 251 lb (113.9 kg)   SpO2 97%   BMI 44.46 kg/m    Physical Exam Vitals reviewed.  Constitutional:      General: She is not in acute distress.    Appearance: Normal appearance. She is obese. She is not ill-appearing, toxic-appearing or diaphoretic.  Eyes:     General:        Right eye: No discharge.        Left eye: No discharge.     Conjunctiva/sclera: Conjunctivae normal.  Neck:     Thyroid: No thyromegaly or thyroid tenderness.  Cardiovascular:     Rate and Rhythm: Normal rate and regular rhythm.     Heart sounds: Normal heart sounds.  Pulmonary:     Effort: Pulmonary effort is normal.     Breath sounds: Normal breath sounds. No wheezing.  Abdominal:     General: Bowel sounds are normal.  Musculoskeletal:        General: Normal range of motion.  Right lower leg: No edema.     Left lower leg: No edema.  Skin:    General: Skin is warm and dry.  Neurological:     General: No focal deficit present.     Mental Status: She is alert and oriented to person, place, and time. Mental status is at baseline.  Psychiatric:        Attention and Perception: Attention normal.        Mood and Affect: Affect is flat.        Speech: Speech normal.        Behavior: Behavior is agitated.        Thought Content: Thought content normal.         Judgment: Judgment normal.      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  Follows with Dr. Juanetta Gosling in [place]. Was able to reschedule appointment 2 June for hospital follow-up. Patient to cancel appointment with vascular surgery in [place]   Type 2 diabetes mellitus with other diabetic kidney complication [facility]) Assessment & Plan: Chronic.  Recent A1c 9.7. Recently admitted overnight to hospital for transient visual loss. Patient concern for worsening diabetes. She would like to continue further conversations with her endocrinologist and is not sure why she was scheduled for follow-up appointment today with PCP. No indication for blood work given labs drawn yesterday. Has scheduled appointment to see endocrinologist May 15 Has follow-up appointment scheduled for nephrology May 29 Encouraged to healthy diet and activity as able Follow-up with PCP as needed   Stage 3b chronic kidney disease [facility]) Assessment & Plan: Chronic.  Recently restarted torsemide during hospitalization. Has follow-up appointment with Dr. Thedore Mins, nephrology May 29. Avoid nephrotoxic agents.   Depression, recurrent (HCC) Assessment & Plan: Chronic.  Was recently started on Wellbutrin 100 mg twice daily during hospitalization.  Patient reports has not taken medication.  Denies SI/HI.  Given her concern for decreased appetite would recommend holding off on medication for now.   Referral previously sent to psychiatry however patient canceled referral. Follow-up in 2 to 3 weeks to discuss.    PDMP reviewed  Return in about 1 month (around 11/24/2022).  Total of 45 minutes spent with patient, greater than 50% of time spent face to face on counseling and coordination of care, specifically nutrition, medication side effects, benefits and risks of GLP1, adhering to current insulin regime and ensure checking glucose prior to meals to administer  proper dosing, rescheduling appointment.     [name], MD

## 2022-10-30 ENCOUNTER — Ambulatory Visit: Payer: Self-pay

## 2022-10-30 NOTE — Patient Instructions (Signed)
Visit Information  Thank you for taking time to visit with me today. Please don't hesitate to contact me if I can be of assistance to you.   Following are the goals we discussed today:  Treat high blood sugars with sliding scale provided by endocrinologist Treat low blood sugars using Rule 15  Monitor blood sugars/ blood pressures / weight daily and record Take blood sugar monitor to next endocrinology appointment.    Our next appointment is by telephone on 11/30/22 at 10:30am  Please call the care guide team at [phone] if you need to cancel or reschedule your appointment.   If you are experiencing a Mental Health or Behavioral Health Crisis or need someone to talk to, please call the Suicide and Crisis Lifeline: 988 call 1-800-273-TALK (toll free, 24 hour hotline)  The patient verbalized understanding of instructions, educational materials, and care plan provided today and agreed to receive a mailed copy of patient instructions, educational materials, and care plan.   [name] RN,BSN,CCM [facility] Care Coordination [phone] direct line

## 2022-10-30 NOTE — Patient Outreach (Signed)
  Care Coordination   Follow Up Visit Note   10/30/2022 Name: Alexis Farmer MRN: [id number] DOB: 1940-08-02  Alexis Farmer is a 82 y.o. year old female who sees [name], MD for primary care. I spoke with  [name] by phone today.  What matters to the patients health and wellness today?  Patient reports recent ED visit due to vision disturbance and dizziness. She states she was discharged the next day.  Stroke was ruled out. Patient states her blood sugars and blood pressures have been elevated.  She reports blood sugars range from 150 to 300's.  She states she sometimes has feelings of hypoglycemia at 150 or at 300.  Patient states she will sometimes treat a blood sugar of 150 due to having hypoglycemic symptoms and then her blood sugar goes up to 300. Patient states she has a follow up visit with the endocrinologist on tomorrow. Per chart review patient has follow up scheduled with oncologist on 10/31/22, nephrologist on 11/14/22 and vascular doctor on 11/27/22.    Goals Addressed             This Visit's Progress    Patient Stated:  " Getting my diabetes under control and manage heart failure"       Interventions Today    Flowsheet Row Most Recent Value  Chronic Disease   Chronic disease during today's visit Diabetes, Hypertension (HTN), Congestive Heart Failure (CHF)  General Interventions   General Interventions Discussed/Reviewed General Interventions Reviewed, Doctor Visits  [evaluation of current treatment plan for Diabetes, HF, HTN and patients adherence to plan as established by provider. Assessed for blood pressure and blood sugar readings.]  Doctor Visits Discussed/Reviewed Doctor Visits Reviewed  Annabell Sabal upcoming provider visits. confirmed patient has transportation for appointment on 10/31/22.]  Education Interventions   Education Provided Provided Education  [Discussed hypoglycemia/ hyperglycemia. Discussed Rule of 15 treatment for hypoglycemia.  Reinforced  need to monitor blood pressures and weight daily and record.]  Provided Verbal Education On Blood Sugar Monitoring, Other  [Encouraged patient to monitor blood sugars daily.  Advised to take blood sugar monitor to endocrinology visit scheduled for 10/31/22. Reinforced importance of not overtreating blood sugar.]  Mental Health Interventions   Mental Health Discussed/Reviewed Mental Health Reviewed, Coping Strategies  [discussed coping strategies related to stress: deep breathing exercises, remaining socially connected, more positive self talk.]  Nutrition Interventions   Nutrition Discussed/Reviewed Nutrition Reviewed, Carbohydrate meal planning, Decreasing sugar intake  [Discussed diabetic diet/ food choices.]  Pharmacy Interventions   Pharmacy Dicussed/Reviewed Pharmacy Topics Reviewed  [medications reviewed and discussed. Reviewed insulin sliding scale.]                 SDOH assessments and interventions completed:  No     Care Coordination Interventions:  Yes, provided   Follow up plan: Follow up call scheduled for 11/30/22    Encounter Outcome:  Pt. Visit Completed   [name] RN,BSN,CCM [facility] Care Coordination [phone] direct line

## 2022-10-31 ENCOUNTER — Inpatient Hospital Stay: Payer: Medicare Other | Attending: Oncology | Admitting: Oncology

## 2022-10-31 ENCOUNTER — Encounter: Payer: Self-pay | Admitting: Oncology

## 2022-10-31 VITALS — BP 172/65 | HR 61 | Temp 97.0°F | Resp 18 | Wt 248.0 lb

## 2022-10-31 DIAGNOSIS — N1832 Chronic kidney disease, stage 3b: Secondary | ICD-10-CM | POA: Diagnosis not present

## 2022-10-31 DIAGNOSIS — D72820 Lymphocytosis (symptomatic): Secondary | ICD-10-CM | POA: Diagnosis not present

## 2022-10-31 DIAGNOSIS — E1122 Type 2 diabetes mellitus with diabetic chronic kidney disease: Secondary | ICD-10-CM | POA: Diagnosis not present

## 2022-10-31 DIAGNOSIS — E1165 Type 2 diabetes mellitus with hyperglycemia: Secondary | ICD-10-CM | POA: Diagnosis not present

## 2022-10-31 NOTE — Assessment & Plan Note (Signed)
Recommend patient continue follow-up with primary care provider, tighten glycemic control.

## 2022-10-31 NOTE — Assessment & Plan Note (Addendum)
Encourage oral hydration. Avoid nephrotoxins. 

## 2022-10-31 NOTE — Progress Notes (Signed)
Hematology/Oncology Progress note [phone] [phone]       ASSESSMENT & PLAN:   Monoclonal B-cell lymphocytosis of undetermined significance #Monoclonal lymphocytosis of unknown significance.  Labs are reviewed and discussed with patient. 12/26/21 peripheral blood flowcytometry showed CD5 positive monoclonal B cell lymphocytes, 17% of leukocytes, <5000/ul, phenotype is non specific.  10/10/2022 flowcytometry showed a partial CD5 positive monoclonal B-cell population detected, 15% of leukocytes, <5,000/ul.  Patient is asymptomatic, no constitutional symptoms. Recommend observation.   Stage 3b chronic kidney disease (HCC) Encourage oral hydration.  Avoid nephrotoxins.  Uncontrolled type 2 diabetes mellitus with hyperglycemia (HCC) Recommend patient continue follow-up with primary care provider, tighten glycemic control.   Orders Placed This Encounter  Procedures   CBC with Differential (Cancer Center Only)    Standing Status:   Future    Standing Expiration Date:   10/31/2023   CMP (Cancer Center only)    Standing Status:   Future    Standing Expiration Date:   10/31/2023   Follow-up in 6 months. All questions were answered. The patient knows to call the clinic with any problems, questions or concerns.  Rickard Patience, MD, PhD [facility] Hematology Oncology 10/31/2022   CHIEF COMPLAINTS/REASON FOR VISIT:  Follow up for monoclonal lymphocytosis  HISTORY OF PRESENTING ILLNESS:  Alexis Farmer is a  82 y.o.  female with PMH listed below who was referred to me for evaluation of leukocytosis Reviewed patient' recent labs obtained by PCP.   12/13/2021 CBC showed elevated white count of 6.6, increased lymphocyte percentage 55.7.  Absolute lymphocytes 3.7. Previous lab records reviewed. Leukocytosis onset of intermittent. Patient reports chronic arthritis, bilateral lower extremity swelling, left worse than right.  She was recently seen by dermatology for a  skin lesion on her back.  Patient reports that her previously weighed 260 pounds.  Currently she weighed 240 pounds. Appetite is fair. No fever, night sweating.  Patient was accompanied by her granddaughter.  08/12/21 CT abdomen pelvis did not show any lymphadenopathy. Physical examination did not showed lymphadenopathy.   INTERVAL HISTORY Alexis Farmer is a 82 y.o. female who has above history reviewed by me today presents for follow up visit for monoclonal lymphocytosis. Recent admission due to presyncope, vison loss uncontrolled diabetes with complication.  She was accompanied by granddaughter. No new complaints.   Review of Systems  Constitutional:  Positive for fatigue. Negative for appetite change, chills, fever and unexpected weight change.  HENT:   Negative for hearing loss and voice change.   Eyes:  Negative for eye problems.  Respiratory:  Negative for chest tightness and cough.   Cardiovascular:  Negative for chest pain.  Gastrointestinal:  Negative for abdominal distention, abdominal pain and blood in stool.  Endocrine: Negative for hot flashes.  Genitourinary:  Negative for difficulty urinating and frequency.   Musculoskeletal:  Positive for arthralgias.  Skin:  Negative for itching.  Neurological:  Negative for extremity weakness.  Hematological:  Negative for adenopathy.  Psychiatric/Behavioral:  Negative for confusion.      MEDICAL HISTORY:  Past Medical History:  Diagnosis Date   Anxiety 09/13/2015   Arthritis    Atypical chest pain 01/15/2021   Blackhead 10/24/2021   Bradycardia 07/17/2016   Formatting of this note might be different from the original.  Last Assessment & Plan:   Found to be bradycardic at outside hospital. They stopped metoprolol and verapamil and heart rate has been normal since then. Echo appears to have been reassuring at the outside facility. Appears to  be doing much better with regards to this. She'll continue to monitor for recurrent  symptoms. 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  Last Assessment & Plan:   Continues to have issues with this.  She is following with a specialist and it sounds as though they are planning on an MRI.  Benign exam today.   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 Loletha Carrow of this note might be different from the original.  Dr. Thedore Mins      Last Assessment & Plan:   Recheck kidney function today.   Constipation 11/17/2019   Cough 01/25/2016   Formatting of this note might be different from the original.  Last Assessment & Plan:   Minimal nighttime cough. Improves when she washes her pillows consistently. Suspect allergies contributing. She'll monitor.   Diabetes mellitus without complication (HCC)    one elevated reading/ no treatment   Disorder of rotator cuff 06/24/2018   Diverticulitis    Dysuria 03/22/2017   Formatting of this note might be different from the original.  Last Assessment & Plan:   Symptoms concerning for UTI.  Will check urinalysis.   Edema of foot 04/08/2014   Formatting of this note might be different from the original.  Last Assessment & Plan:   Chronic pedal edema. Suspect venous insufficiency. No orthopnea or shortness of breath. Advised elevation of her legs. Consider compression stockings in the future.   Elevated troponin 12/15/2020   Essential hypertension 04/08/2014   Formatting of this note might be different from the original.  Last Assessment & Plan:   Well-controlled on recheck.  Continue current regimen.   Fall 03/26/2018   Gastroenteritis 08/12/2021   Gastrointestinal hemorrhage 08/03/2015   GI bleed    High cholesterol    History of blood transfusion    Hyperglycemia due to type 2 diabetes mellitus (HCC) 03/11/2015   Hypertension    Hypertensive urgency 12/15/2020   Low back pain 04/08/2014   Morbid obesity (HCC)  04/08/2014   Formatting of this note might be different from the original.  Last Assessment & Plan:   Weight is stable. Discussed diet and exercise at length. Encouraged whatever exercise she can do. Given diet information.   Palpitations 12/15/2020   Peripheral edema 12/22/2019   Postmenopausal bleeding 07/17/2016   Formatting of this note might be different from the original.  Last Assessment & Plan:   Recent D&C. Following with gynecology. Benign findings. Monitor for recurrence.   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  Last Assessment & Plan:   Continues to have issues with this.  She is following with a specialist and it sounds as though they are planning on an MRI.  Benign exam today.   Recurrent falls 08/13/2019   Renal insufficiency    Skin cyst 10/24/2021   Stage 3a chronic kidney disease (HCC) 01/15/2021   Swelling of lower leg 07/19/2016   Vertigo 10/13/2015   Formatting of this note might be different from the original.  Last Assessment & Plan:   No recurrence. Discussed that meclizine is an as needed medication and that she does not need to take this daily. She will continue to monitor.    SURGICAL HISTORY: Past Surgical  History:  Procedure Laterality Date   APPENDECTOMY     CHOLECYSTECTOMY     ECTOPIC PREGNANCY SURGERY     EYE SURGERY     bilateral cataracts   EYE SURGERY     02/11/2019 repair hole in right eye    gallbladder      HYSTEROSCOPY WITH D & C N/A 10/26/2016   Procedure: DILATATION AND CURETTAGE /HYSTEROSCOPY;  Surgeon: Christeen Douglas, MD;  Location: [facility];  Service: Gynecology;  Laterality: N/A;   HYSTEROSCOPY WITH D & C N/A 07/07/2018   Procedure: DILATATION AND CURETTAGE /HYSTEROSCOPY;  Surgeon: Christeen Douglas, MD;  Location: [facility];  Service: Gynecology;  Laterality: N/A;   THYROIDECTOMY, PARTIAL       SOCIAL HISTORY: Social History   Socioeconomic History   Marital status: Widowed    Spouse name: Not on file   Number of children: 1   Years of education: 45   Highest education level: 12th grade  Occupational History   Occupation: retired    Comment: hx of Retail banker, Advertising copywriter, 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  Tobacco Use   Smoking status: Never   Smokeless tobacco: Never  Vaping Use   Vaping Use: Never used  Substance and Sexual Activity   Alcohol use: No   Drug use: No   Sexual activity: Not Currently  Other Topics Concern   Not on file  Social History Narrative   Lives alone    From [place]   Widowed - was married 3 times starting at age 35    hx of seamtress, 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   Social Determinants of Health   Financial Resource Strain: Low Risk  (09/28/2020)   Overall Financial Resource Strain (CARDIA)    Difficulty of Paying Living Expenses: Not hard at all  Food Insecurity: No Food Insecurity (10/22/2022)   Hunger Vital Sign    Worried About Running Out of Food in the Last Year: Never true    Ran Out of Food in the Last Year: Never true  Transportation Needs: No Transportation Needs (10/22/2022)   PRAPARE - Administrator, Civil Service (Medical): No    Lack of Transportation (Non-Medical): No  Physical Activity: Inactive (05/05/2020)   Exercise Vital Sign    Days of Exercise per Week: 0 days    Minutes of Exercise per Session: 0 min  Stress: Stress Concern Present (08/23/2020)   [employer] of Occupational Health - Occupational Stress Questionnaire    Feeling of Stress : To some extent  Social Connections: Socially Isolated (08/23/2020)   Social Connection and Isolation Panel [NHANES]    Frequency of Communication with Friends and Family: More than three times a week    Frequency of Social Gatherings with Friends and Family: More than three times a  week    Attends Religious Services: Never    Database administrator or Organizations: No    Attends Banker Meetings: Never    Marital Status: Widowed  Intimate Partner Violence: Not At Risk (10/22/2022)   Humiliation, Afraid, Rape, and Kick questionnaire    Fear of Current or Ex-Partner: No    Emotionally Abused: No    Physically Abused: No    Sexually Abused: No    FAMILY HISTORY: Family History  Problem Relation Age of Onset   Diabetes Mother    Hypertension Mother    Stroke Mother    Diabetes Other  Healthy Father    Diabetes Sister    Heart disease Sister     ALLERGIES:  is allergic to celexa [citalopram], jardiance [empagliflozin], norvasc [amlodipine], tape, penicillin v, penicillin v potassium, and penicillins.  MEDICATIONS:  Current Outpatient Medications  Medication Sig Dispense Refill   Ascorbic Acid (VITAMIN C PO) Take 1 tablet by mouth daily.     aspirin EC 81 MG tablet Take 81 mg by mouth 2 (two) times a week. Swallow whole.     cloNIDine (CATAPRES) 0.2 MG tablet Take 1 tablet (0.2 mg total) by mouth 2 (two) times daily. (Patient taking differently: Take 0.2 mg by mouth 3 (three) times daily.) 270 tablet 3   hydrALAZINE (APRESOLINE) 100 MG tablet TAKE 1 TABLET (100 MG TOTAL) BY MOUTH 4 (FOUR) TIMES DAILY. 360 tablet 2   Multiple Vitamins-Minerals (CENTRUM SILVER 50+WOMEN) TABS Take 1 tablet by mouth daily with breakfast.     nebivolol (BYSTOLIC) 10 MG tablet Take 1 tablet (10 mg total) by mouth daily. 90 tablet 2   rosuvastatin (CRESTOR) 20 MG tablet TAKE 1 TABLET BY MOUTH EVERY DAY 90 tablet 3   spironolactone (ALDACTONE) 25 MG tablet Take 1 tablet (25 mg total) by mouth daily. 90 tablet 3   torsemide (DEMADEX) 10 MG tablet Take 1 tablet (10 mg total) by mouth daily. 90 tablet 3   valsartan (DIOVAN) 160 MG tablet Take 1 tablet (160 mg total) by mouth daily. 90 tablet 0   acetaminophen (TYLENOL) 500 MG tablet Take 500 mg by mouth every 6 (six) hours  as needed for mild pain or headache.     BD INSULIN SYRINGE U/F 30G X 1/2" 0.5 ML MISC USE UP TO 5X PER DAY WITH INSULIN 2X (NPH) AND 3X (REGULAR) 500 each 12   blood glucose meter kit and supplies KIT Accu chek, Dx code E11.65, check 3 times daily 1 each 0   Blood Pressure KIT 1 Device by Does not apply route daily. 1 kit 0   buPROPion ER (WELLBUTRIN SR) 100 MG 12 hr tablet Take 1 tablet (100 mg total) by mouth 2 (two) times daily. (Patient not taking: Reported on 10/30/2022) 60 tablet 2   glucose blood (ACCU-CHEK GUIDE) test strip USE AS INSTRUCTED 3 TIMES DAILY 300 strip 12   insulin NPH Human (NOVOLIN N) 100 UNIT/ML injection INJECT 20 UNITS IN THE MORNING AND THE EVENING WITH A MEAL (Patient taking differently: 40 Units 2 (two) times daily before a meal.) 40 mL PRN   insulin regular (NOVOLIN R) 100 units/mL injection Inject 0-0.08 mLs (0-8 Units total) into the skin 3 (three) times daily before meals. 30 mL 11   LORazepam (ATIVAN) 1 MG tablet Take 0.5 tablets (0.5 mg total) by mouth daily as needed for anxiety. Inform pt 0.5 dose on backorder has to cut this in 1/2 dose 15 tablet 5   nystatin (MYCOSTATIN/NYSTOP) powder Apply topically 2 (two) times daily. Under abdomen 60 g 11   polyethylene glycol powder (GLYCOLAX/MIRALAX) 17 GM/SCOOP powder Take 17 g by mouth daily as needed for moderate constipation or severe constipation. Can take up to 2x per day prn. Mix with 8 ounces of liquid 3350 g 11   No current facility-administered medications for this visit.     PHYSICAL EXAMINATION: ECOG PERFORMANCE STATUS: 2 - Symptomatic, <50% confined to bed Vitals:   10/31/22 1436  BP: (!) 172/65  Pulse: 61  Resp: 18  Temp: (!) 97 F (36.1 C)   Filed Weights   10/31/22  1436  Weight: 248 lb (112.5 kg)    Physical Exam Constitutional:      General: She is not in acute distress.    Appearance: She is obese.  HENT:     Head: Normocephalic.  Eyes:     General: No scleral  icterus. Cardiovascular:     Rate and Rhythm: Normal rate.  Pulmonary:     Effort: Pulmonary effort is normal. No respiratory distress.     Breath sounds: No wheezing.  Abdominal:     General: Bowel sounds are normal. There is no distension.     Palpations: Abdomen is soft.  Musculoskeletal:        General: No deformity. Normal range of motion.     Cervical back: Normal range of motion and neck supple.     Right lower leg: Edema present.     Left lower leg: Edema present.  Skin:    General: Skin is warm and dry.     Findings: No erythema or rash.  Neurological:     Mental Status: She is alert and oriented to person, place, and time. Mental status is at baseline.     Cranial Nerves: No cranial nerve deficit.     Coordination: Coordination normal.  Psychiatric:        Mood and Affect: Mood normal.     Labs are reviewed by me.   RADIOGRAPHIC STUDIES: I have personally reviewed the radiological images as listed and agreed with the findings in the report. US Venous Img Lower Unilateral Left  Result Date: 10/22/2022 CLINICAL DATA:  Left lower extremity pain and edema. Evaluate for DVT. EXAM: LEFT LOWER EXTREMITY VENOUS DOPPLER ULTRASOUND TECHNIQUE: Gray-scale sonography with graded compression, as well as color Doppler and duplex ultrasound were performed to evaluate the lower extremity deep venous systems from the level of the common femoral vein and including the common femoral, femoral, profunda femoral, popliteal and calf veins including the posterior tibial, peroneal and gastrocnemius veins when visible. The superficial great saphenous vein was also interrogated. Spectral Doppler was utilized to evaluate flow at rest and with distal augmentation maneuvers in the common femoral, femoral and popliteal veins. COMPARISON:  None Available. FINDINGS: Contralateral Common Femoral Vein: Respiratory phasicity is normal and symmetric with the symptomatic side. No evidence of thrombus. Normal  compressibility. Common Femoral Vein: No evidence of thrombus. Normal compressibility, respiratory phasicity and response to augmentation. Saphenofemoral Junction: No evidence of thrombus. Normal compressibility and flow on color Doppler imaging. Profunda Femoral Vein: No evidence of thrombus. Normal compressibility and flow on color Doppler imaging. Femoral Vein: No evidence of thrombus. Normal compressibility, respiratory phasicity and response to augmentation. Popliteal Vein: No evidence of thrombus. Normal compressibility, respiratory phasicity and response to augmentation. Calf Veins: Not well visualized. Superficial Great Saphenous Vein: No evidence of thrombus. Normal compressibility. Other Findings: There is a minimal amount of subcutaneous edema at the level of the left calf. IMPRESSION: No evidence of DVT within the left lower extremity. Electronically Signed   By: Simonne Come M.D.   On: 10/22/2022 10:03   CT ANGIO HEAD NECK W WO CM  Result Date: 10/22/2022 CLINICAL DATA:  Vision changes and ringing in the ears. EXAM: CT ANGIOGRAPHY HEAD AND NECK WITH AND WITHOUT CONTRAST TECHNIQUE: Multidetector CT imaging of the head and neck was performed using the standard protocol during bolus administration of intravenous contrast. Multiplanar CT image reconstructions and MIPs were obtained to evaluate the vascular anatomy. Carotid stenosis measurements (when applicable) are obtained utilizing NASCET  criteria, using the distal internal carotid diameter as the denominator. RADIATION DOSE REDUCTION: This exam was performed according to the departmental dose-optimization program which includes automated exposure control, adjustment of the mA and/or kV according to patient size and/or use of iterative reconstruction technique. CONTRAST:  60mL OMNIPAQUE IOHEXOL 350 MG/ML SOLN COMPARISON:  None Available. FINDINGS: CT HEAD FINDINGS Brain: There is no acute intracranial hemorrhage, extra-axial fluid collection, or acute  territorial infarct. Parenchymal volume is normal for age. The ventricles are normal in size. Gray-white differentiation is preserved. Hypodensity in the supratentorial white matter likely reflects sequela of underlying chronic small-vessel ischemic change. The pituitary and suprasellar region are normal. There is no mass lesion there is no mass effect or midline shift. Vascular: See below. Skull: Normal. Negative for fracture or focal lesion. Sinuses/Orbits: The paranasal sinuses are clear. The mastoid air cells and middle ear cavities are clear. Bilateral lens implants are in place. The globes and orbits are otherwise unremarkable. Other: None. Review of the MIP images confirms the above findings CTA NECK FINDINGS Aortic arch: There is calcified plaque in the aortic arch. 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 The origins of the major branch vessels are patent. Right carotid system: The right common carotid artery is patent. There is bulky plaque in the proximal internal carotid artery resulting in up to approximately 60-70% stenosis. The distal internal carotid artery is patent. The external carotid artery is patent. There is no evidence of dissection or aneurysm. Left carotid system: The left common carotid artery is patent with mild plaque. There is mild plaque at the bifurcation and in the proximal internal carotid artery without hemodynamically significant stenosis or occlusion. The external carotid artery is patent. There is no evidence of dissection or aneurysm. Vertebral arteries: The vertebral arteries are patent, without hemodynamically significant stenosis or occlusion there is no evidence of dissection or aneurysm. Skeleton: There is no acute osseous abnormality or suspicious osseous lesion. There is no visible canal hematoma. Other neck: The soft tissues of the neck are unremarkable. Upper  chest: The imaged lung apices are clear. Review of the MIP images confirms the above findings CTA HEAD FINDINGS Anterior circulation: There is calcified plaque in the intracranial ICAs resulting mild-to-moderate stenosis of the supraclinoid segments. The bilateral MCAs are patent, without proximal stenosis or occlusion. The bilateral ACAs are patent, without proximal stenosis or occlusion. The anterior communicating artery is normal. There is no aneurysm or AVM. Posterior circulation: There is mild plaque in the bilateral V4 segments resulting in up to mild stenosis on the right. The basilar artery is patent. The major cerebellar arteries appear patent. The bilateral PCAs are patent, with focal mild-to-moderate stenosis of the distal left P1 segment. There is no other proximal stenosis or occlusion. There is no aneurysm or AVM. Venous sinuses: Patent. Anatomic variants: None. Review of the MIP images confirms the above findings IMPRESSION: 1. No acute intracranial pathology on initial noncontrast head CT. 2. Bulky plaque at the right carotid bifurcation resulting in approximately 60-70% stenosis of the proximal internal carotid artery. Mild plaque at the left carotid bifurcation without significant stenosis. 3. Intracranial atherosclerotic disease resulting in mild-to-moderate stenosis of both supraclinoid ICAs, and mild stenosis of the right V4 and left P1 segments. 4. 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 5. 2.4 cm heterogeneous left thyroid nodule. Consider further  evaluation with nonemergent thyroid ultrasound as indicated given patient age. Electronically Signed   By: Lesia Hausen M.D.   On: 10/22/2022 10:03   CT ABDOMEN PELVIS WO CONTRAST  Result Date: 10/22/2022 CLINICAL DATA:  Abdominal pain.  Nonlocalized. EXAM: CT ABDOMEN AND PELVIS WITHOUT CONTRAST TECHNIQUE: Multidetector CT imaging of the abdomen and pelvis was performed  following the standard protocol without IV contrast. RADIATION DOSE REDUCTION: This exam was performed according to the departmental dose-optimization program which includes automated exposure control, adjustment of the mA and/or kV according to patient size and/or use of iterative reconstruction technique. COMPARISON:  08/12/2021 FINDINGS: Lower chest: No acute abnormality. Similar appearance of subpleural scarring and architectural distortion within the paravertebral right lung base. No pleural fluid or acute airspace disease. Hepatobiliary: No focal liver abnormality. Status post cholecystectomy. No bile duct dilatation. Pancreas: Unremarkable. No pancreatic ductal dilatation or surrounding inflammatory changes. Spleen: Normal in size without focal abnormality. Adrenals/Urinary Tract: Normal adrenal glands. No nephrolithiasis, hydronephrosis or suspicious mass. Urinary bladder is unremarkable. Stomach/Bowel: Stomach appears normal. The patient is status post appendectomy. No pathologic dilatation of the large or small bowel loops. No bowel wall thickening or inflammation. Vascular/Lymphatic: Aortic atherosclerosis. No abdominopelvic adenopathy. Reproductive: Uterus and bilateral adnexa are unremarkable. Other: No ascites or focal fluid collections. Musculoskeletal: No acute or significant osseous findings. There is a first degree anterolisthesis of L4 on L5. Multi level disc space narrowing and endplate spurring noted. Bilateral facet arthropathy. IMPRESSION: 1. No acute findings within the abdomen or pelvis. 2. Status post cholecystectomy and appendectomy. 3. 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 4.  Aortic Atherosclerosis (ICD10-I70.0). Electronically Signed   By: Signa Kell M.D.   On: 10/22/2022 09:54   DG Chest Portable 1 View  Result Date: 10/22/2022 CLINICAL DATA:  Chest pain. EXAM: PORTABLE CHEST 1 VIEW COMPARISON:  08/14/2021 FINDINGS: Stable mild cardiac enlargement.  Aortic atherosclerotic calcifications. Pulmonary vascular congestion. No airspace consolidation. 1.1 cm indeterminate nodular opacity within the periphery of the left upper lobe. The visualized osseous structures are unremarkable. IMPRESSION: 1. Mild cardiac enlargement and pulmonary vascular congestion. 2. 1.1 cm indeterminate nodular opacity within the periphery of the left upper lobe. Consider further evaluation with nonemergent CT of the chest. Electronically Signed   By: Signa Kell M.D.   On: 10/22/2022 05:38     LABORATORY DATA:  I have reviewed the data as listed    Latest Ref Rng & Units 10/22/2022    5:12 AM 10/10/2022   10:12 AM 04/06/2022    9:25 AM  CBC  WBC 4.0 - 10.5 K/uL 10.3  7.3  7.1   Hemoglobin 12.0 - 15.0 g/dL 16.1  09.6  04.5   Hematocrit 36.0 - 46.0 % 40.3  37.9  38.5   Platelets 150 - 400 K/uL 258  237  235        Latest Ref Rng & Units 10/23/2022    4:21 AM 10/22/2022    5:12 AM 10/10/2022   10:12 AM  CMP  Glucose 70 - 99 mg/dL 409  811  914   BUN 8 - 23 mg/dL 37  35  31   Creatinine 0.44 - 1.00 mg/dL 7.82  9.56  2.13   Sodium 135 - 145 mmol/L 133  135  132   Potassium 3.5 - 5.1 mmol/L 4.7  4.5  4.6   Chloride 98 - 111 mmol/L 101  100  100   CO2 22 - 32 mmol/L 24  23  25   Calcium 8.9 - 10.3 mg/dL 9.5  16.1  9.9   Total Protein 6.5 - 8.1 g/dL  7.9  7.5   Total Bilirubin 0.3 - 1.2 mg/dL  1.1  0.6   Alkaline Phos 38 - 126 U/L  41  40   AST 15 - 41 U/L  32  31   ALT 0 - 44 U/L  30  24     Iron/TIBC/Ferritin/ %Sat    Component Value Date/Time   IRON 78 12/13/2021 1033   TIBC 341.6 12/13/2021 1033   FERRITIN 246.9 12/13/2021 1033   IRONPCTSAT 22.8 12/13/2021 1033

## 2022-10-31 NOTE — Assessment & Plan Note (Addendum)
#  Monoclonal lymphocytosis of unknown significance.  Labs are reviewed and discussed with patient. 12/26/21 peripheral blood flowcytometry showed CD5 positive monoclonal B cell lymphocytes, 17% of leukocytes, <5000/ul, phenotype is non specific.  10/10/2022 flowcytometry showed a partial CD5 positive monoclonal B-cell population detected, 15% of leukocytes, <5,000/ul.  Patient is asymptomatic, no constitutional symptoms. Recommend observation.

## 2022-11-01 ENCOUNTER — Ambulatory Visit: Payer: Self-pay | Admitting: *Deleted

## 2022-11-01 NOTE — Patient Instructions (Signed)
Visit Information  Thank you for taking time to visit with me today. Please don't hesitate to contact me if I can be of assistance to you.   Following are the goals we discussed today:   Goals Addressed             This Visit's Progress    Managing visual hallucinations       Interventions Today    Flowsheet Row Most Recent Value  Chronic Disease   Chronic disease during today's visit Diabetes, Hypertension (HTN), Congestive Heart Failure (CHF)  General Interventions   General Interventions Discussed/Reviewed General Interventions Reviewed, Community Resources  Doctor Visits Discussed/Reviewed Doctor Visits Discussed  [Appt with vascular doctor 6/11-patient will arrange medicaid transportation]  Mental Health Interventions   Mental Health Discussed/Reviewed Mental Health Reviewed, Coping Strategies, Anxiety  [patient states that her anxiety is managable, very appreciative of support from granddaughter which helps decrease anxiety-declined mental health follow up at this time]  Safety Interventions   Safety Discussed/Reviewed Safety Discussed  Advanced Directive Interventions   Advanced Directives Discussed/Reviewed Advanced Directives Discussed  [patient not interested in completing AD/Living will at this time]               If you are experiencing a Mental Health or Behavioral Health Crisis or need someone to talk to, please call the Suicide and Crisis Lifeline: 988   Patient verbalizes understanding of instructions and care plan provided today and agrees to view in MyChart. Active MyChart status and patient understanding of how to access instructions and care plan via MyChart confirmed with patient.     No further follow up required: patient encouraged to contact this Child psychotherapist with any additional community resource needs  [employer], LCSW Clinical Social Worker  [facility] Care Management [phone]

## 2022-11-01 NOTE — Patient Outreach (Signed)
  Care Coordination   Follow Up Visit Note   11/01/2022 Name: Alexis Farmer MRN: [id number] DOB: [birth year]  Alexis Farmer is a 82 y.o. year old female who sees [name], MD for primary care. I spoke with  [name] by phone today.  What matters to the patients health and wellness today?  Patient declines mental health follow up at this time, states that she is managing her anxiety and has the support of her granddaughter. Patient encouraged to contact this social worker with any additional community resource needs.    Goals Addressed             This Visit's Progress    Managing visual hallucinations       Interventions Today    Flowsheet Row Most Recent Value  Chronic Disease   Chronic disease during today's visit Diabetes, Hypertension (HTN), Congestive Heart Failure (CHF)  General Interventions   General Interventions Discussed/Reviewed General Interventions Reviewed, Community Resources  Doctor Visits Discussed/Reviewed Doctor Visits Discussed  [Appt with vascular doctor 6/11-patient will arrange medicaid transportation]  Mental Health Interventions   Mental Health Discussed/Reviewed Mental Health Reviewed, Coping Strategies, Anxiety  [patient states that her anxiety is managable, very appreciative of support from granddaughter which helps decrease anxiety-declined mental health follow up at this time]  Safety Interventions   Safety Discussed/Reviewed Safety Discussed  Advanced Directive Interventions   Advanced Directives Discussed/Reviewed Advanced Directives Discussed  [patient not interested in completing AD/Living will at this time]              SDOH assessments and interventions completed:  No     Care Coordination Interventions:  Yes, provided   Follow up plan: No further intervention required.   Encounter Outcome:  Pt. Visit Completed

## 2022-11-02 ENCOUNTER — Encounter: Payer: Medicare Other | Admitting: *Deleted

## 2022-11-06 ENCOUNTER — Other Ambulatory Visit: Payer: Self-pay | Admitting: Family Medicine

## 2022-11-06 DIAGNOSIS — F419 Anxiety disorder, unspecified: Secondary | ICD-10-CM

## 2022-11-06 DIAGNOSIS — F410 Panic disorder [episodic paroxysmal anxiety] without agoraphobia: Secondary | ICD-10-CM

## 2022-11-06 NOTE — Telephone Encounter (Signed)
Pt need a refill on LORazepam sent to [facility] 

## 2022-11-07 MED ORDER — LORAZEPAM 1 MG PO TABS
0.5000 mg | ORAL_TABLET | Freq: Every day | ORAL | 0 refills | Status: DC | PRN
Start: 2022-11-07 — End: 2023-01-08

## 2022-11-07 NOTE — Telephone Encounter (Signed)
Pended for approval.

## 2022-11-11 NOTE — Assessment & Plan Note (Signed)
Chronic.  Follows with Dr. Juanetta Gosling in [place]. Was able to reschedule appointment 2 June for hospital follow-up. Patient to cancel appointment with vascular surgery in [facility]

## 2022-11-11 NOTE — Assessment & Plan Note (Signed)
Chronic.  Was recently started on Wellbutrin 100 mg twice daily during hospitalization.  Patient reports has not taken medication.  Denies SI/HI.  Given her concern for decreased appetite would recommend holding off on medication for now.   Referral previously sent to psychiatry however patient canceled referral. Follow-up in 2 to 3 weeks to discuss.

## 2022-11-11 NOTE — Assessment & Plan Note (Signed)
Chronic.  Recent A1c 9.7. Recently admitted overnight to hospital for transient visual loss. Patient concern for worsening diabetes. She would like to continue further conversations with her endocrinologist and is not sure why she was scheduled for follow-up appointment today with PCP. No indication for blood work given labs drawn yesterday. Has scheduled appointment to see endocrinologist May 15 Has follow-up appointment scheduled for nephrology May 29 Encouraged to healthy diet and activity as able Follow-up with PCP as needed

## 2022-11-11 NOTE — Assessment & Plan Note (Signed)
Chronic.  Recently restarted torsemide during hospitalization. Has follow-up appointment with Dr. Thedore Mins, nephrology May 29. Avoid nephrotoxic agents.

## 2022-11-13 ENCOUNTER — Telehealth: Payer: Self-pay | Admitting: *Deleted

## 2022-11-13 NOTE — Telephone Encounter (Signed)
   Telephone encounter was:  Successful.  11/13/2022 Name: Ambar Fleitas MRN: [id number] DOB: 01-12-1941  Juleesa Pavel is a 82 y.o. year old female who is a primary care patient of [name], MD . The community resource team was consulted for assistance with Transportation Needs   Care guide performed the following interventions: Patient provided with information about care guide support team and interviewed to confirm resource needs. Booked transportation for 11/19/2022 and 11/27/2022  Follow Up Plan:  No further follow up planned at this time. The patient has been provided with needed resources.  Yehuda Mao Greenauer -[facility] [place], Population Health [phone] [address] [place] , [place] [address] Email : [email or web address]

## 2022-11-19 ENCOUNTER — Other Ambulatory Visit: Payer: Self-pay | Admitting: *Deleted

## 2022-11-19 DIAGNOSIS — I6523 Occlusion and stenosis of bilateral carotid arteries: Secondary | ICD-10-CM

## 2022-11-19 DIAGNOSIS — I739 Peripheral vascular disease, unspecified: Secondary | ICD-10-CM

## 2022-11-26 NOTE — Progress Notes (Deleted)
[facility] [place]  ASSESSMENT / PLAN: Alexis Farmer is a 82 y.o. 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  Patient counseled patients with asymptomatic peripheral arterial disease or claudication have a 1-2% risk of developing chronic limb threatening ischemia, but a 15-30% risk of mortality in the next 5 years. Intervention should only be considered for medically optimized patients with disabling symptoms.   Recommend the following which can slow the progression of atherosclerosis and reduce the risk of major adverse cardiac / limb events:  Complete cessation from all tobacco products. Blood glucose control with goal A1c < 7%. Blood pressure control with goal blood pressure < 140/90 mmHg. Lipid reduction therapy with goal LDL-C <100 mg/dL (<13 if symptomatic from PAD).  Aspirin 81mg  PO QD.  Atorvastatin 40-80mg  PO QD (or other "high intensity" statin therapy).  Follow up in 1 year for surveillance  CHIEF COMPLAINT: establish care  HISTORY OF PRESENT ILLNESS: Alexis Farmer is a 82 y.o. female previously followed by [facility] for bilateral lower extremity claudication, right upper extremity asymptomatic arterial disease, and carotid artery stenosis.  She is asymptomatic from all of these.  She was counseled to establish care in [place] to make transportation easier for her caretakers.  Reports she can walk without much difficulty.  She has no symptoms typical of ischemic rest pain.  She has no ischemic ulceration.  She denies any symptoms of stroke or TIA.  He has no difficulty using the right upper extremity.  She denies vertebrobasilar symptoms.  10/10/21: Returns to clinic.  She is highly concerned about her right subclavian artery  occlusion.  This is asymptomatic.  She has no rest pain or ischemic ulceration of her right upper extremity and she reports no effort induced dizziness or discomfort in her right upper extremity.  She does not walk fast or far enough to claudicate.  She reports no new neurologic symptoms.  Past Medical History:  Diagnosis Date   Anxiety 09/13/2015   Arthritis    Atypical chest pain 01/15/2021   [place] 10/24/2021   Bradycardia 07/17/2016   Formatting of this note might be different from the original.  Last Assessment & Plan:   Found to be bradycardic at outside hospital. They stopped metoprolol and verapamil and heart rate has been normal since then. Echo appears to have been reassuring at the outside facility. Appears to be doing much better with regards to this. She'll continue to monitor for recurrent symptoms. 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  Last Assessment & Plan:   Continues to have issues with this.  She is following with a specialist and it sounds as though they are planning on an MRI.  Benign exam today.   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 Loletha Carrow of this note might be different from the original.  Dr. Thedore Mins      Last Assessment & Plan:   Recheck kidney function today.   Constipation 11/17/2019   Cough 01/25/2016   Formatting of this note might be different from the original.  Last Assessment & Plan:   Minimal nighttime cough. Improves when she washes her pillows consistently. Suspect allergies contributing. She'll monitor.   Diabetes mellitus  without complication (HCC)    one elevated reading/ no treatment   Disorder of rotator cuff 06/24/2018   Diverticulitis    Dysuria 03/22/2017   Formatting of this note might be different from the original.  Last Assessment & Plan:   Symptoms concerning for UTI.  Will check urinalysis.    Edema of foot 04/08/2014   Formatting of this note might be different from the original.  Last Assessment & Plan:   Chronic pedal edema. Suspect venous insufficiency. No orthopnea or shortness of breath. Advised elevation of her legs. Consider compression stockings in the future.   Elevated troponin 12/15/2020   Essential hypertension 04/08/2014   Formatting of this note might be different from the original.  Last Assessment & Plan:   Well-controlled on recheck.  Continue current regimen.   Fall 03/26/2018   Gastroenteritis 08/12/2021   Gastrointestinal hemorrhage 08/03/2015   GI bleed    High cholesterol    History of blood transfusion    Hyperglycemia due to type 2 diabetes mellitus (HCC) 03/11/2015   Hypertension    Hypertensive urgency 12/15/2020   Low back pain 04/08/2014   Morbid obesity (HCC) 04/08/2014   Formatting of this note might be different from the original.  Last Assessment & Plan:   Weight is stable. Discussed diet and exercise at length. Encouraged whatever exercise she can do. Given diet information.   Palpitations 12/15/2020   Peripheral edema 12/22/2019   Postmenopausal bleeding 07/17/2016   Formatting of this note might be different from the original.  Last Assessment & Plan:   Recent D&C. Following with gynecology. Benign findings. Monitor for recurrence.   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  Last Assessment & Plan:   Continues to have issues with this.  She is following with a specialist and it sounds as though they are planning on an MRI.  Benign exam today.   Recurrent falls 08/13/2019   Renal insufficiency    Skin cyst 10/24/2021   Stage 3a chronic kidney disease (HCC) 01/15/2021   Swelling of lower leg 07/19/2016   Vertigo 10/13/2015   Formatting of this note might be different from the original.  Last  Assessment & Plan:   No recurrence. Discussed that meclizine is an as needed medication and that she does not need to take this daily. She will continue to monitor.    Past Surgical History:  Procedure Laterality Date   APPENDECTOMY     CHOLECYSTECTOMY     ECTOPIC PREGNANCY SURGERY     EYE SURGERY     bilateral cataracts   EYE SURGERY     02/11/2019 repair hole in right eye    gallbladder      HYSTEROSCOPY WITH D & C N/A 10/26/2016   Procedure: DILATATION AND CURETTAGE /HYSTEROSCOPY;  Surgeon: Christeen Douglas, MD;  Location: [facility] ORS;  Service: Gynecology;  Laterality: N/A;   HYSTEROSCOPY WITH D & C N/A 07/07/2018   Procedure: DILATATION AND CURETTAGE /HYSTEROSCOPY;  Surgeon: Christeen Douglas, MD;  Location: [facility] ORS;  Service: Gynecology;  Laterality: N/A;   THYROIDECTOMY, PARTIAL      Family History  Problem Relation Age of Onset   Diabetes Mother    Hypertension Mother    Stroke Mother    Diabetes Other    Healthy Father    Diabetes Sister    Heart disease Sister  Social History   Socioeconomic History   Marital status: Widowed    Spouse name: Not on file   Number of children: 1   Years of education: 34   Highest education level: 12th grade  Occupational History   Occupation: retired    Comment: hx of Retail banker, Advertising copywriter, 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  Tobacco Use   Smoking status: Never   Smokeless tobacco: Never  Vaping Use   Vaping Use: Never used  Substance and Sexual Activity   Alcohol use: No   Drug use: No   Sexual activity: Not Currently  Other Topics Concern   Not on file  Social History Narrative   Lives alone    From [place]   Widowed - was married 3 times starting at age 54    hx of seamtress, 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   Social Determinants of Health   Financial Resource Strain: Low Risk  (09/28/2020)   Overall Financial Resource Strain (CARDIA)     Difficulty of Paying Living Expenses: Not hard at all  Food Insecurity: No Food Insecurity (10/22/2022)   Hunger Vital Sign    Worried About Running Out of Food in the Last Year: Never true    Ran Out of Food in the Last Year: Never true  Transportation Needs: No Transportation Needs (10/22/2022)   PRAPARE - Administrator, Civil Service (Medical): No    Lack of Transportation (Non-Medical): No  Physical Activity: Inactive (05/05/2020)   Exercise Vital Sign    Days of Exercise per Week: 0 days    Minutes of Exercise per Session: 0 min  Stress: Stress Concern Present (08/23/2020)   [employer] of Occupational Health - Occupational Stress Questionnaire    Feeling of Stress : To some extent  Social Connections: Socially Isolated (08/23/2020)   Social Connection and Isolation Panel [NHANES]    Frequency of Communication with Friends and Family: More than three times a week    Frequency of Social Gatherings with Friends and Family: More than three times a week    Attends Religious Services: Never    Database administrator or Organizations: No    Attends Banker Meetings: Never    Marital Status: Widowed  Intimate Partner Violence: Not At Risk (10/22/2022)   Humiliation, Afraid, Rape, and Kick questionnaire    Fear of Current or Ex-Partner: No    Emotionally Abused: No    Physically Abused: No    Sexually Abused: No    Allergies  Allergen Reactions   Celexa [Citalopram]     Diarrhea upset stomach    Jardiance [Empagliflozin] Other (See Comments)    Reaction not recalled   Norvasc [Amlodipine]     Leg edema   Tape Other (See Comments)    Leaves the skin "raw" if left on for a period of time- tolerates paper tape   Penicillin V Rash   Penicillin V Potassium Rash   Penicillins Rash    Has patient had a PCN reaction causing immediate rash, facial/tongue/throat swelling, SOB or lightheadedness with hypotension: Yes Has patient had a PCN reaction causing severe  rash involving mucus membranes or skin necrosis: No Has patient had a PCN reaction that required hospitalization No Has patient had a PCN reaction occurring within the last 10 years: Yes If all of the above answers are "NO", then may proceed with Cephalosporin use.     Current Outpatient Medications  Medication Sig Dispense Refill   acetaminophen (TYLENOL) 500 MG tablet Take 500 mg by mouth every 6 (six) hours as needed for mild pain or headache.     Ascorbic Acid (VITAMIN C PO) Take 1 tablet by mouth daily.     aspirin EC 81 MG tablet Take 81 mg by mouth 2 (two) times a week. Swallow whole.     BD INSULIN SYRINGE U/F 30G X 1/2" 0.5 ML MISC USE UP TO 5X PER DAY WITH INSULIN 2X (NPH) AND 3X (REGULAR) 500 each 12   blood glucose meter kit and supplies KIT Accu chek, Dx code E11.65, check 3 times daily 1 each 0   Blood Pressure KIT 1 Device by Does not apply route daily. 1 kit 0   buPROPion ER (WELLBUTRIN SR) 100 MG 12 hr tablet Take 1 tablet (100 mg total) by mouth 2 (two) times daily. (Patient not taking: Reported on 10/30/2022) 60 tablet 2   cloNIDine (CATAPRES) 0.2 MG tablet Take 1 tablet (0.2 mg total) by mouth 2 (two) times daily. (Patient taking differently: Take 0.2 mg by mouth 3 (three) times daily.) 270 tablet 3   glucose blood (ACCU-CHEK GUIDE) test strip USE AS INSTRUCTED 3 TIMES DAILY 300 strip 12   hydrALAZINE (APRESOLINE) 100 MG tablet TAKE 1 TABLET (100 MG TOTAL) BY MOUTH 4 (FOUR) TIMES DAILY. 360 tablet 2   insulin NPH Human (NOVOLIN N) 100 UNIT/ML injection INJECT 20 UNITS IN THE MORNING AND THE EVENING WITH A MEAL (Patient taking differently: 40 Units 2 (two) times daily before a meal.) 40 mL PRN   insulin regular (NOVOLIN R) 100 units/mL injection Inject 0-0.08 mLs (0-8 Units total) into the skin 3 (three) times daily before meals. 30 mL 11   LORazepam (ATIVAN) 1 MG tablet Take 0.5 tablets (0.5 mg total) by mouth daily as needed for anxiety. Inform pt 0.5 dose on backorder has to  cut this in 1/2 dose 15 tablet 0   Multiple Vitamins-Minerals (CENTRUM SILVER 50+WOMEN) TABS Take 1 tablet by mouth daily with breakfast.     nebivolol (BYSTOLIC) 10 MG tablet Take 1 tablet (10 mg total) by mouth daily. 90 tablet 2   nystatin (MYCOSTATIN/NYSTOP) powder Apply topically 2 (two) times daily. Under abdomen 60 g 11   polyethylene glycol powder (GLYCOLAX/MIRALAX) 17 GM/SCOOP powder Take 17 g by mouth daily as needed for moderate constipation or severe constipation. Can take up to 2x per day prn. Mix with 8 ounces of liquid 3350 g 11   rosuvastatin (CRESTOR) 20 MG tablet TAKE 1 TABLET BY MOUTH EVERY DAY 90 tablet 3   spironolactone (ALDACTONE) 25 MG tablet Take 1 tablet (25 mg total) by mouth daily. 90 tablet 3   torsemide (DEMADEX) 10 MG tablet Take 1 tablet (10 mg total) by mouth daily. 90 tablet 3   valsartan (DIOVAN) 160 MG tablet Take 1 tablet (160 mg total) by mouth daily. 90 tablet 0   No current facility-administered medications for this visit.    REVIEW OF SYSTEMS:  [X]  denotes positive finding, [ ]  denotes negative finding Cardiac  Comments:  Chest pain or chest pressure:    Shortness of breath upon exertion:    Short of breath when lying flat:    Irregular heart rhythm:        Vascular    Pain in calf, thigh, or hip brought on by ambulation:    Pain in feet at night that wakes you up from your sleep:  Blood clot in your veins:    Leg swelling:         Pulmonary    Oxygen at home:    Productive cough:     Wheezing:         Neurologic    Sudden weakness in arms or legs:     Sudden numbness in arms or legs:     Sudden onset of difficulty speaking or slurred speech:    Temporary loss of vision in one eye:     Problems with dizziness:         Gastrointestinal    Blood in stool:     Vomited blood:         Genitourinary    Burning when urinating:     Blood in urine:        Psychiatric    Major depression:         Hematologic    Bleeding problems:     Problems with blood clotting too easily:        Skin    Rashes or ulcers:        Constitutional    Fever or chills:      PHYSICAL EXAM  There were no vitals filed for this visit.   Constitutional: Elderly. No distress. Morbidly obese.  Neurologic: CN intact. No focal findings. No sensory loss. Psychiatric: Mood and affect symmetric and appropriate. Eyes: No icterus. No conjunctival pallor. Ears, nose, throat: mucous membranes moist. Midline trachea.  Cardiac: regular rate and rhythm.  Respiratory: unlabored. Abdominal: soft, non-tender, non-distended.  Peripheral vascular:  Absent pedal pulses  2+ L radial pulse   Absent R radial pulse Extremity: No edema. No cyanosis. No pallor.  Skin: No gangrene. No ulceration.  Lymphatic: No Stemmer's sign. No palpable lymphadenopathy.  PERTINENT LABORATORY AND RADIOLOGIC DATA  Most recent CBC    Latest Ref Rng & Units 10/22/2022    5:12 AM 10/10/2022   10:12 AM 04/06/2022    9:25 AM  CBC  WBC 4.0 - 10.5 K/uL 10.3  7.3  7.1   Hemoglobin 12.0 - 15.0 g/dL 16.1  09.6  04.5   Hematocrit 36.0 - 46.0 % 40.3  37.9  38.5   Platelets 150 - 400 K/uL 258  237  235      Most recent CMP    Latest Ref Rng & Units 10/23/2022    4:21 AM 10/22/2022    5:12 AM 10/10/2022   10:12 AM  CMP  Glucose 70 - 99 mg/dL 409  811  914   BUN 8 - 23 mg/dL 37  35  31   Creatinine 0.44 - 1.00 mg/dL 7.82  9.56  2.13   Sodium 135 - 145 mmol/L 133  135  132   Potassium 3.5 - 5.1 mmol/L 4.7  4.5  4.6   Chloride 98 - 111 mmol/L 101  100  100   CO2 22 - 32 mmol/L 24  23  25    Calcium 8.9 - 10.3 mg/dL 9.5  08.6  9.9   Total Protein 6.5 - 8.1 g/dL  7.9  7.5   Total Bilirubin 0.3 - 1.2 mg/dL  1.1  0.6   Alkaline Phos 38 - 126 U/L  41  40   AST 15 - 41 U/L  32  31   ALT 0 - 44 U/L  30  24     Renal function CrCl cannot be calculated (Patient's most recent lab result is older than the maximum 21  days allowed.).  Hgb A1c MFr Bld (%)  Date Value  10/22/2022  9.5 (H)    LDL Cholesterol  Date Value Ref Range Status  12/15/2020 40 0 - 99 mg/dL Final    Comment:           Total Cholesterol/HDL:CHD Risk Coronary Heart Disease Risk Table                     Men   Women  1/2 Average Risk   3.4   3.3  Average Risk       5.0   4.4  2 X Average Risk   9.6   7.1  3 X Average Risk  23.4   11.0        Use the calculated Patient Ratio above and the CHD Risk Table to determine the patient's CHD Risk.        ATP III CLASSIFICATION (LDL):  <100     mg/dL   Optimal  161-096  mg/dL   Near or Above                    Optimal  130-159  mg/dL   Borderline  045-409  mg/dL   High  >811     mg/dL   Very High Performed at [facility], [address], [place], [place] [address]    Direct LDL  Date Value Ref Range Status  12/13/2021 46.0 mg/dL Final    Comment:    Optimal:  <100 mg/dLNear or Above Optimal:  100-129 mg/dLBorderline High:  130-159 mg/dLHigh:  160-189 mg/dLVery High:  >190 mg/dL     Vascular Imaging: Right Carotid: Velocities in the right ICA are consistent with a 1-39%  stenosis.   Left Carotid: Velocities in the left ICA are consistent with a 1-39%  stenosis.   Vertebrals:  Left vertebral artery demonstrates antegrade flow. Right  vertebral               artery demonstrates retrograde flow.  Subclavians: Normal flow hemodynamics were seen in the left subclavian  artery.               Right monophasic.   Rande Brunt. Lenell Antu, MD [facility] Phone Number: [phone] 11/26/2022 12:01 PM

## 2022-11-27 ENCOUNTER — Other Ambulatory Visit: Payer: Self-pay

## 2022-11-27 ENCOUNTER — Ambulatory Visit (HOSPITAL_COMMUNITY): Payer: Medicare Other

## 2022-11-27 ENCOUNTER — Emergency Department (HOSPITAL_COMMUNITY): Payer: Medicare Other

## 2022-11-27 ENCOUNTER — Encounter (HOSPITAL_COMMUNITY): Payer: Self-pay

## 2022-11-27 ENCOUNTER — Emergency Department (HOSPITAL_COMMUNITY)
Admission: EM | Admit: 2022-11-27 | Discharge: 2022-11-27 | Disposition: A | Discharge status: Home / Self Care | Payer: Medicare Other | Attending: Emergency Medicine | Admitting: Emergency Medicine

## 2022-11-27 ENCOUNTER — Ambulatory Visit: Payer: Medicare Other | Admitting: Vascular Surgery

## 2022-11-27 DIAGNOSIS — Z7982 Long term (current) use of aspirin: Secondary | ICD-10-CM | POA: Insufficient documentation

## 2022-11-27 DIAGNOSIS — Z794 Long term (current) use of insulin: Secondary | ICD-10-CM | POA: Diagnosis not present

## 2022-11-27 DIAGNOSIS — R0602 Shortness of breath: Secondary | ICD-10-CM | POA: Insufficient documentation

## 2022-11-27 DIAGNOSIS — R600 Localized edema: Secondary | ICD-10-CM | POA: Diagnosis not present

## 2022-11-27 DIAGNOSIS — I130 Hypertensive heart and chronic kidney disease with heart failure and stage 1 through stage 4 chronic kidney disease, or unspecified chronic kidney disease: Secondary | ICD-10-CM | POA: Insufficient documentation

## 2022-11-27 DIAGNOSIS — R0981 Nasal congestion: Secondary | ICD-10-CM | POA: Insufficient documentation

## 2022-11-27 DIAGNOSIS — N189 Chronic kidney disease, unspecified: Secondary | ICD-10-CM | POA: Diagnosis not present

## 2022-11-27 DIAGNOSIS — D649 Anemia, unspecified: Secondary | ICD-10-CM | POA: Diagnosis not present

## 2022-11-27 DIAGNOSIS — R42 Dizziness and giddiness: Secondary | ICD-10-CM | POA: Insufficient documentation

## 2022-11-27 DIAGNOSIS — I6782 Cerebral ischemia: Secondary | ICD-10-CM | POA: Diagnosis not present

## 2022-11-27 DIAGNOSIS — Z1152 Encounter for screening for COVID-19: Secondary | ICD-10-CM | POA: Diagnosis not present

## 2022-11-27 DIAGNOSIS — R030 Elevated blood-pressure reading, without diagnosis of hypertension: Secondary | ICD-10-CM

## 2022-11-27 DIAGNOSIS — I509 Heart failure, unspecified: Secondary | ICD-10-CM | POA: Diagnosis not present

## 2022-11-27 LAB — CBC WITH DIFFERENTIAL/PLATELET
Abs Immature Granulocytes: 0.02 10*3/uL (ref 0.00–0.07)
Basophils Absolute: 0.1 10*3/uL (ref 0.0–0.1)
Basophils Relative: 1 %
Eosinophils Absolute: 0.2 10*3/uL (ref 0.0–0.5)
Eosinophils Relative: 3 %
HCT: 36.6 % (ref 36.0–46.0)
Hemoglobin: 11.7 g/dL — ABNORMAL LOW (ref 12.0–15.0)
Immature Granulocytes: 0 %
Lymphocytes Relative: 56 %
Lymphs Abs: 4.4 10*3/uL — ABNORMAL HIGH (ref 0.7–4.0)
MCH: 29 pg (ref 26.0–34.0)
MCHC: 32 g/dL (ref 30.0–36.0)
MCV: 90.6 fL (ref 80.0–100.0)
Monocytes Absolute: 0.7 10*3/uL (ref 0.1–1.0)
Monocytes Relative: 9 %
Neutro Abs: 2.5 10*3/uL (ref 1.7–7.7)
Neutrophils Relative %: 31 %
Platelets: 231 10*3/uL (ref 150–400)
RBC: 4.04 MIL/uL (ref 3.87–5.11)
RDW: 13.5 % (ref 11.5–15.5)
WBC: 7.9 10*3/uL (ref 4.0–10.5)
nRBC: 0 % (ref 0.0–0.2)

## 2022-11-27 LAB — URINALYSIS, W/ REFLEX TO CULTURE (INFECTION SUSPECTED)
Bacteria, UA: NONE SEEN
Bilirubin Urine: NEGATIVE
Glucose, UA: 150 mg/dL — AB
Hgb urine dipstick: NEGATIVE
Ketones, ur: NEGATIVE mg/dL
Leukocytes,Ua: NEGATIVE
Nitrite: NEGATIVE
Protein, ur: 100 mg/dL — AB
Specific Gravity, Urine: 1.005 (ref 1.005–1.030)
pH: 6 (ref 5.0–8.0)

## 2022-11-27 LAB — RESP PANEL BY RT-PCR (RSV, FLU A&B, COVID)  RVPGX2
Influenza A by PCR: NEGATIVE
Influenza B by PCR: NEGATIVE
Resp Syncytial Virus by PCR: NEGATIVE
SARS Coronavirus 2 by RT PCR: NEGATIVE

## 2022-11-27 LAB — COMPREHENSIVE METABOLIC PANEL
ALT: 23 U/L (ref 0–44)
AST: 30 U/L (ref 15–41)
Albumin: 3.7 g/dL (ref 3.5–5.0)
Alkaline Phosphatase: 40 U/L (ref 38–126)
Anion gap: 11 (ref 5–15)
BUN: 36 mg/dL — ABNORMAL HIGH (ref 8–23)
CO2: 24 mmol/L (ref 22–32)
Calcium: 9.8 mg/dL (ref 8.9–10.3)
Chloride: 99 mmol/L (ref 98–111)
Creatinine, Ser: 1.71 mg/dL — ABNORMAL HIGH (ref 0.44–1.00)
GFR, Estimated: 29 mL/min — ABNORMAL LOW (ref >60)
Glucose, Bld: 278 mg/dL — ABNORMAL HIGH (ref 70–99)
Potassium: 5.1 mmol/L (ref 3.5–5.1)
Sodium: 134 mmol/L — ABNORMAL LOW (ref 135–145)
Total Bilirubin: 0.7 mg/dL (ref 0.3–1.2)
Total Protein: 6.8 g/dL (ref 6.5–8.1)

## 2022-11-27 LAB — LIPASE, BLOOD: Lipase: 29 U/L (ref 11–51)

## 2022-11-27 LAB — TROPONIN I (HIGH SENSITIVITY)
Troponin I (High Sensitivity): 15 ng/L (ref <18)
Troponin I (High Sensitivity): 15 ng/L (ref <18)

## 2022-11-27 LAB — TSH: TSH: 2.204 u[IU]/mL (ref 0.350–4.500)

## 2022-11-27 LAB — CBG MONITORING, ED: Glucose-Capillary: 249 mg/dL — ABNORMAL HIGH (ref 70–99)

## 2022-11-27 LAB — BRAIN NATRIURETIC PEPTIDE: B Natriuretic Peptide: 137.6 pg/mL — ABNORMAL HIGH (ref 0.0–100.0)

## 2022-11-27 MED ORDER — MECLIZINE HCL 25 MG PO TABS: 25.0000 mg | ORAL_TABLET | Freq: Three times a day (TID) | ORAL | 0 refills | Status: DC | PRN

## 2022-11-27 MED ORDER — LORAZEPAM 2 MG/ML IJ SOLN
1.0000 mg | Freq: Once | INTRAMUSCULAR | Status: AC | PRN
  Administered 2022-11-27: 1 mg via INTRAVENOUS
  Filled 2022-11-27: qty 1

## 2022-11-27 MED ORDER — IRBESARTAN 300 MG PO TABS
150.0000 mg | ORAL_TABLET | Freq: Every day | ORAL | Status: DC
  Administered 2022-11-27: 150 mg via ORAL
  Filled 2022-11-27: qty 1

## 2022-11-27 MED ORDER — ONDANSETRON 4 MG PO TBDP: 4.0000 mg | ORAL_TABLET | Freq: Three times a day (TID) | ORAL | 0 refills | Status: DC | PRN

## 2022-11-27 MED ORDER — NEBIVOLOL HCL 10 MG PO TABS: 10.0000 mg | ORAL_TABLET | Freq: Every day | ORAL | Status: DC

## 2022-11-27 MED ORDER — MECLIZINE HCL 25 MG PO TABS
25.0000 mg | ORAL_TABLET | Freq: Once | ORAL | Status: AC
  Administered 2022-11-27: 25 mg via ORAL
  Filled 2022-11-27: qty 1

## 2022-11-27 MED ORDER — CLONIDINE HCL 0.2 MG PO TABS
0.2000 mg | ORAL_TABLET | Freq: Once | ORAL | Status: AC
  Administered 2022-11-27: 0.2 mg via ORAL
  Filled 2022-11-27: qty 1

## 2022-11-27 NOTE — Discharge Instructions (Signed)
Your history, exam, and workup today did not show evidence of acute stroke as the cause of your dizziness today.  I suspect that your combination of some dehydration and elevated blood pressures contributed.  We gave you some meclizine and some fluids and your symptoms improved slightly.  The rest of your workup did not show worsened findings and your kidney function actually was improved compared to prior.  We do feel you need to follow-up with your vascular team which she reports you have rescheduled and also follow-up with your primary doctor.  Given the dizziness even struggling with, please consider follow-up with outpatient neurology as well.  Please rest and stay hydrated and use the nausea and dizzy medicine at home.  We had a shared decision-making conversation including discussing the possibility of either admission versus awaiting to see physical therapy/Occupational Therapy to see about potential rehab placement however we agree that given your improved workup and well appearance we feel you are safe to attempt discharge home.  If any symptoms change or worsen acutely, please return to the nearest emergency department.

## 2022-11-27 NOTE — ED Notes (Signed)
Patient transported to MRI 

## 2022-11-27 NOTE — ED Provider Notes (Signed)
[place] EMERGENCY DEPARTMENT AT [facility] Provider Note   CSN: [id number] Arrival date & time: 11/27/22  8295     History  Chief Complaint  Patient presents with   Dizziness    Quincee Krill is a 82 y.o. female.  The history is provided by the patient and medical records. No language interpreter was used.  Dizziness Quality:  Head spinning, vertigo and room spinning Severity:  Severe Onset quality:  Gradual Duration:  2 days Timing:  Constant Progression:  Waxing and waning Chronicity:  Recurrent Context: head movement and standing up   Relieved by:  Nothing Worsened by:  Nothing Ineffective treatments:  None tried Associated symptoms: nausea, tinnitus (r ear slightly) and vision changes (jumping vision at times)   Associated symptoms: no chest pain, no diarrhea, no headaches, no palpitations, no shortness of breath, no syncope and no weakness   Risk factors: hx of vertigo        Home Medications Prior to Admission medications   Medication Sig Start Date End Date Taking? Authorizing Provider  acetaminophen (TYLENOL) 500 MG tablet Take 500 mg by mouth every 6 (six) hours as needed for mild pain or headache.    [provider]  Ascorbic Acid (VITAMIN C PO) Take 1 tablet by mouth daily.    [provider]  aspirin EC 81 MG tablet Take 81 mg by mouth 2 (two) times a week. Swallow whole.    [provider]  BD INSULIN SYRINGE U/F 30G X 1/2" 0.5 ML MISC USE UP TO 5X PER DAY WITH INSULIN 2X (NPH) AND 3X (REGULAR) 10/02/21   McLean-Scocuzza, Pasty Spillers, MD  blood glucose meter kit and supplies KIT Accu chek, Dx code E11.65, check 3 times daily 07/26/17   Glori Luis, MD  Blood Pressure KIT 1 Device by Does not apply route daily. 10/24/21   McLean-Scocuzza, Pasty Spillers, MD  buPROPion ER [facility] SR) 100 MG 12 hr tablet Take 1 tablet (100 mg total) by mouth 2 (two) times daily. Patient not taking: Reported on 10/30/2022 10/23/22 10/23/23   Arnetha Courser, MD  cloNIDine (CATAPRES) 0.2 MG tablet Take 1 tablet (0.2 mg total) by mouth 2 (two) times daily. Patient taking differently: Take 0.2 mg by mouth 3 (three) times daily. 12/18/21   Antonieta Iba, MD  glucose blood (ACCU-CHEK GUIDE) test strip USE AS INSTRUCTED 3 TIMES DAILY 09/06/22   [name], MD  hydrALAZINE (APRESOLINE) 100 MG tablet TAKE 1 TABLET (100 MG TOTAL) BY MOUTH 4 (FOUR) TIMES DAILY. 09/11/22   Gollan, Tollie Pizza, MD  insulin NPH Human (NOVOLIN N) 100 UNIT/ML injection INJECT 20 UNITS IN THE MORNING AND THE EVENING WITH A MEAL Patient taking differently: 40 Units 2 (two) times daily before a meal. 04/10/21   McLean-Scocuzza, Pasty Spillers, MD  insulin regular (NOVOLIN R) 100 units/mL injection Inject 0-0.08 mLs (0-8 Units total) into the skin 3 (three) times daily before meals. 10/17/20   Romero Belling, MD  LORazepam (ATIVAN) 1 MG tablet Take 0.5 tablets (0.5 mg total) by mouth daily as needed for anxiety. Inform pt 0.5 dose on backorder has to cut this in 1/2 dose 11/07/22   [name], MD  Multiple Vitamins-Minerals (CENTRUM SILVER 50+WOMEN) TABS Take 1 tablet by mouth daily with breakfast.    [provider]  nebivolol (BYSTOLIC) 10 MG tablet Take 1 tablet (10 mg total) by mouth daily. 07/10/22 01/06/23  Antonieta Iba, MD  nystatin (MYCOSTATIN/NYSTOP) powder Apply topically 2 (two)  times daily. Under abdomen 03/21/22   McLean-Scocuzza, Pasty Spillers, MD  polyethylene glycol powder (GLYCOLAX/MIRALAX) 17 GM/SCOOP powder Take 17 g by mouth daily as needed for moderate constipation or severe constipation. Can take up to 2x per day prn. Mix with 8 ounces of liquid 03/21/22   McLean-Scocuzza, Pasty Spillers, MD  rosuvastatin (CRESTOR) 20 MG tablet TAKE 1 TABLET BY MOUTH EVERY DAY 03/21/22   McLean-Scocuzza, Pasty Spillers, MD  spironolactone (ALDACTONE) 25 MG tablet Take 1 tablet (25 mg total) by mouth daily. 12/13/21   McLean-Scocuzza, Pasty Spillers, MD  torsemide (DEMADEX) 10 MG tablet Take 1  tablet (10 mg total) by mouth daily. 07/11/21   McLean-Scocuzza, Pasty Spillers, MD  valsartan (DIOVAN) 160 MG tablet Take 1 tablet (160 mg total) by mouth daily. 09/17/22   Antonieta Iba, MD      Allergies    Celexa [citalopram], Jardiance [empagliflozin], Norvasc [amlodipine], Tape, Penicillin v, Penicillin v potassium, and Penicillins    Review of Systems   Review of Systems  Constitutional:  Positive for fatigue. Negative for chills and fever.  HENT:  Positive for congestion and tinnitus (r ear slightly).   Eyes:  Negative for pain.  Respiratory:  Positive for cough. Negative for chest tightness, shortness of breath and wheezing.   Cardiovascular:  Negative for chest pain, palpitations and syncope.  Gastrointestinal:  Positive for abdominal pain (mild and chronic per pt) and nausea. Negative for constipation and diarrhea.  Genitourinary:  Negative for dysuria and flank pain.  Musculoskeletal:  Negative for back pain, neck pain and neck stiffness.  Skin:  Negative for rash and wound.  Neurological:  Positive for dizziness. Negative for seizures, facial asymmetry, weakness, light-headedness, numbness and headaches.  Psychiatric/Behavioral:  Negative for agitation and confusion.   All other systems reviewed and are negative.   Physical Exam Updated Vital Signs BP (!) 160/108   Pulse 65   Temp 98.9 F (37.2 C) (Rectal)   Resp 20   Ht 5\' 3"  (1.6 m)   Wt 113 kg   SpO2 100%   BMI 44.13 kg/m  Physical Exam Vitals and nursing note reviewed.  Constitutional:      General: She is not in acute distress.    Appearance: She is well-developed. She is not ill-appearing, toxic-appearing or diaphoretic.  HENT:     Head: Normocephalic and atraumatic.     Nose: Congestion present. No rhinorrhea.     Mouth/Throat:     Mouth: Mucous membranes are dry.     Pharynx: No oropharyngeal exudate or posterior oropharyngeal erythema.  Eyes:     Extraocular Movements: Extraocular movements intact.      Conjunctiva/sclera: Conjunctivae normal.     Pupils: Pupils are equal, round, and reactive to light.  Neck:     Vascular: No carotid bruit.  Cardiovascular:     Rate and Rhythm: Normal rate and regular rhythm.     Heart sounds: No murmur heard. Pulmonary:     Effort: Pulmonary effort is normal. No respiratory distress.     Breath sounds: Normal breath sounds. No wheezing, rhonchi or rales.  Chest:     Chest wall: No tenderness.  Abdominal:     General: Abdomen is flat.     Palpations: Abdomen is soft.     Tenderness: There is no right CVA tenderness, left CVA tenderness, guarding or rebound.  Musculoskeletal:        General: No swelling or tenderness.     Cervical back: Neck supple. No rigidity  or tenderness.     Right lower leg: Edema present.     Left lower leg: Edema present.  Skin:    General: Skin is warm and dry.     Capillary Refill: Capillary refill takes less than 2 seconds.     Findings: No erythema or rash.  Neurological:     Mental Status: She is alert.     Cranial Nerves: No dysarthria or facial asymmetry.     Sensory: No sensory deficit.     Motor: No weakness, tremor, abnormal muscle tone or seizure activity.     Coordination: Finger-Nose-Finger Test normal.     Comments: Intact finger-nose-finger testing.  Intact sensation and strength in extremities.  Symmetric smile.  Clear speech.  Pupils symmetric and reactive but she had some nystagmus.  When patient sat up she got dizzy.  Psychiatric:        Mood and Affect: Mood normal.     ED Results / Procedures / Treatments   Labs (all labs ordered are listed, but only abnormal results are displayed) Labs Reviewed  COMPREHENSIVE METABOLIC PANEL - Abnormal; Notable for the following components:      Result Value   Sodium 134 (*)    Glucose, Bld 278 (*)    BUN 36 (*)    Creatinine, Ser 1.71 (*)    GFR, Estimated 29 (*)    All other components within normal limits  CBC WITH DIFFERENTIAL/PLATELET - Abnormal;  Notable for the following components:   Hemoglobin 11.7 (*)    Lymphs Abs 4.4 (*)    All other components within normal limits  URINALYSIS, W/ REFLEX TO CULTURE (INFECTION SUSPECTED) - Abnormal; Notable for the following components:   Color, Urine STRAW (*)    Glucose, UA 150 (*)    Protein, ur 100 (*)    All other components within normal limits  BRAIN NATRIURETIC PEPTIDE - Abnormal; Notable for the following components:   B Natriuretic Peptide 137.6 (*)    All other components within normal limits  CBG MONITORING, ED - Abnormal; Notable for the following components:   Glucose-Capillary 249 (*)    All other components within normal limits  RESP PANEL BY RT-PCR (RSV, FLU A&B, COVID)  RVPGX2  LIPASE, BLOOD  TSH  TROPONIN I (HIGH SENSITIVITY)  TROPONIN I (HIGH SENSITIVITY)    EKG EKG Interpretation  Date/Time:  Tuesday November 27 2022 06:19:41 EDT Ventricular Rate:  67 PR Interval:  246 QRS Duration: 116 QT Interval:  441 QTC Calculation: 466 R Axis:   -55 Text Interpretation: Sinus rhythm Prolonged PR interval Incomplete left bundle branch block LVH with secondary repolarization abnormality Anterior Q waves, possibly due to LVH Confirmed by Tilden Fossa [phone]) on 11/27/2022 6:45:32 AM  Radiology MR BRAIN WO CONTRAST  Result Date: 11/27/2022 CLINICAL DATA:  Neuro deficit, acute, stroke suspected. Dizziness and unsteadiness. Falling. EXAM: MRI HEAD WITHOUT CONTRAST TECHNIQUE: Multiplanar, multiecho pulse sequences of the brain and surrounding structures were obtained without intravenous contrast. COMPARISON:  CT study 10/22/2022 FINDINGS: Brain: Diffusion imaging does not show any acute or subacute infarction. 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 No focal abnormality affects the brainstem or cerebellum. Cerebral hemispheres show moderate chronic small-vessel ischemic changes of the  white matter, often seen at this age. No large vessel territory stroke. No intra-axial mass, hemorrhage, hydrocephalus or extra-axial collection. Vascular: Flow Skull and upper cervical spine: Negative otherwise. Sinuses/Orbits: Clear/normal  Other: None IMPRESSION: No acute finding. Moderate chronic small-vessel ischemic changes of the cerebral hemispheric white matter, often seen at this age. Right frontal dural calcification that could be a tiny calcified meningioma, not significant. This is associated with some susceptibility artifact that results in some higher signal on the diffusion imaging, which is artifactual rather than indicative of a right frontal infarction. Electronically Signed   By: Paulina Fusi M.D.   On: 11/27/2022 11:54   DG Chest 2 View  Result Date: 11/27/2022 CLINICAL DATA:  Cough and left shoulder pain. EXAM: CHEST - 2 VIEW COMPARISON:  Chest radiographs 10/22/2022 and 08/14/2021 FINDINGS: Cardiac silhouette is again mildly to moderately enlarged. Moderate calcification is seen within the aortic arch. The previously questioned 11 mm nodular opacity overlying the anterior lateral left third rib and lateral left mid lung is not appreciated on the current study. The lungs appear clear. No pleural effusion or pneumothorax. Moderate multilevel degenerative disc changes of the thoracic spine. IMPRESSION: No acute cardiopulmonary disease process. Electronically Signed   By: Neita Garnet M.D.   On: 11/27/2022 08:52    Procedures Procedures    Medications Ordered in ED Medications  irbesartan (AVAPRO) tablet 150 mg (150 mg Oral Given 11/27/22 1312)  LORazepam (ATIVAN) injection 1 mg (1 mg Intravenous Given 11/27/22 0853)  meclizine (ANTIVERT) tablet 25 mg (25 mg Oral Given 11/27/22 0819)  cloNIDine (CATAPRES) tablet 0.2 mg (0.2 mg Oral Given 11/27/22 1312)    ED Course/ Medical Decision Making/ A&P                             Medical Decision Making Amount and/or Complexity of Data  Reviewed Labs: ordered. Radiology: ordered.  Risk Prescription drug management.    Camesha Glazer is a 82 y.o. female with a past medical history significant for hypertension, hypercholesterolemia, CKD, obesity, vertigo, peripheral edema, previous cholecystectomy, previous appendectomy, lymphedema, " monoclonal B-cell lymphocytosis of undetermined significance", CHF, right subclavian artery steal syndrome, and peripheral arterial disease who presents with multiple complaints including recurrent dizziness that is preventing ambulation.  According to patient, she was recently about a month ago for dizziness that eventually improved.  She reports all days ago she had another spell of that done it went away with rest.  She reports that since yesterday she has had almost persistent dizziness where she cannot sit up, stand up, or ambulate.  She ended up urinating on her self because she could not get up or do anything without having a fall.  She reports she is having some tinnitus in her right ear and reports her vision is jumping.  She says this feels similar to when she had her vertigo in the past but worse.  She reports she chronically has discomfort in her left shoulder and is still having some cough and some shortness of breath.  She reports he is having some congestion.  She reports some nausea but no vomiting.  She reports no new constipation or diarrhea compared to baseline.  She has peripheral edema that is similar.  She is complaining of all these things but the dizziness is her biggest concern.  She reports she was post to see a vascular doctor today for her right subclavian problems and other peripheral arterial disease.  On exam, lungs were clear and chest was nontender.  Abdomen was not focally tender on my exam and I did hear good bowel sounds.  She had intact pulses on  my extremity examination but she had edema in both legs.  She reports this is unchanged from her baseline.  She did not have  carotid bruit on my exam and patient did have some mild wax in ears but no evidence of otitis externa or focal tenderness on my exam.  No rash seen to suggest shingles.  She did have nystagmus when looking to the left but not to the right.  She had intact finger-nose-finger testing and had intact sensation and strength in extremities.  Symmetric smile.  Pupils are symmetric and reactive.  Clear speech.  Chart review shows that patient was supposed to get an MRI but refused during her last admission.  Thus, stroke was not definitively ruled out as etiology of her recurrent dizziness.  We will get MRI of her brain today and patient is willing to do this with a dose of Ativan for anxiety.  She is adamant that she does not want contrast to hurt her kidneys because she is very concerned about that.  Will hold on repeat CTAs at this time.  Will get chest x-ray with her cough, labs with her nausea, get urinalysis, and look for other causes of the unsteadiness and symptoms.  Will give her dose of meclizine that she reports she took 8 years ago for vertigo and it seemed to help.  Anticipate reassessment after workup to determine disposition.  1:07 PM Continued to return.  BNP improved from prior.  Initial troponin normal.  Viral testing negative.  Urinalysis does not show UTI.  Metabolic panel showed improving creatinine from prior and CBC reassuring with only mild anemia of 11.7 down from 12.  MRI of the brain MRI of the brain did not show evidence of acute stroke and showed some patient's.  Chest x-ray did not show pneumonia.  After fluids and some meclizine patient reports her dizziness has improved slightly but still has some.  We had a shared decision-making conversation including offering attempted admission versus PT/OT evaluation that could lead to temporary rehab placement or other facility placement but patient would like to try going home.  She would like prescription for the dizzy medicine with  meclizine and some nausea medicine.  Will send in prescription for both of these.  Patient will continue her home blood pressure medicine.  She did not take her blood pressure medicines morning so we will give her home blood pressure medicine.  Patient agrees with plan and we will p.o. challenge and then discharge home.  Patient agrees and understood return precautions.  If she is to return because she still cannot take care of heard dizziness, patient would likely need admission for placement at that point.           Final Clinical Impression(s) / ED Diagnoses Final diagnoses:  Dizziness  Vertigo  Elevated blood pressure reading    Rx / DC Orders ED Discharge Orders          Ordered    meclizine (ANTIVERT) 25 MG tablet  3 times daily PRN        11/27/22 1330    ondansetron (ZOFRAN-ODT) 4 MG disintegrating tablet  Every 8 hours PRN        11/27/22 1330           Clinical Impression: 1. Dizziness   2. Vertigo   3. Elevated blood pressure reading     Disposition: Discharge  Condition: Good  I have discussed the results, Dx and Tx plan with the pt(& family if  present). He/she/they expressed understanding and agree(s) with the plan. Discharge instructions discussed at great length. Strict return precautions discussed and pt &/or family have verbalized understanding of the instructions. No further questions at time of discharge.    New Prescriptions   MECLIZINE (ANTIVERT) 25 MG TABLET    Take 1 tablet (25 mg total) by mouth 3 (three) times daily as needed for dizziness.   ONDANSETRON (ZOFRAN-ODT) 4 MG DISINTEGRATING TABLET    Take 1 tablet (4 mg total) by mouth every 8 (eight) hours as needed for nausea or vomiting.    Follow Up: [name], MD [address] [place] [address] [phone]     [facility] [address]     [address] [place] [address] [phone]  for the dizziness  [facility] Emergency  Department at [facility] [address] [id number] mc [place] [address] [phone]        Mercy Malena, Canary Brim, MD 11/27/22 1350

## 2022-11-29 ENCOUNTER — Telehealth: Payer: Self-pay

## 2022-11-29 ENCOUNTER — Ambulatory Visit (INDEPENDENT_AMBULATORY_CARE_PROVIDER_SITE_OTHER): Payer: Medicare Other

## 2022-11-29 VITALS — Ht 63.0 in | Wt 249.0 lb

## 2022-11-29 DIAGNOSIS — Z0000 Encounter for general adult medical examination without abnormal findings: Secondary | ICD-10-CM

## 2022-11-29 NOTE — Patient Instructions (Addendum)
Alexis Farmer , Thank you for taking time to come for your Medicare Wellness Visit. I appreciate your ongoing commitment to your health goals. Please review the following plan we discussed and let me know if I can assist you in the future.   These are the goals we discussed:  Goals       Community resources      Interventions Today    Flowsheet Row Most Recent Value  Chronic Disease   Chronic disease during today's visit Diabetes  General Interventions   General Interventions Discussed/Reviewed General Interventions Reviewed  [facility] sent to community resource guide regarding patient not being picked up by assigned transportation carrier for provider appointment.]            Controlling disturbing visions/ hallucinations      Care Coordination Interventions: Reviewed medications with patient and discussed if recently started new medications Reviewed scheduled/upcoming provider appointments  Social Work referral for disturbing visions/ hallucinations Advised patient to discuss disturbing visions/ hallucinations with provider Collaborated with [employer], LCSW regarding patient having hallucinations Active listening/ support provided.         Increase physical activity      Post physical therapy exercises: sitting exercises, walking, stretching      Managing visual hallucinations      Interventions Today    Flowsheet Row Most Recent Value  Chronic Disease   Chronic disease during today's visit Diabetes, Hypertension (HTN), Congestive Heart Failure (CHF)  General Interventions   General Interventions Discussed/Reviewed General Interventions Reviewed, Community Resources  Doctor Visits Discussed/Reviewed Doctor Visits Discussed  [Appt with vascular doctor 6/11-patient will arrange medicaid transportation]  Mental Health Interventions   Mental Health Discussed/Reviewed Mental Health Reviewed, Coping Strategies, Anxiety  [patient states that her anxiety is managable, very  appreciative of support from granddaughter which helps decrease anxiety-declined mental health follow up at this time]  Safety Interventions   Safety Discussed/Reviewed Safety Discussed  Advanced Directive Interventions   Advanced Directives Discussed/Reviewed Advanced Directives Discussed  [patient not interested in completing AD/Living will at this time]            Patient Stated (pt-stated)      Patient states her goal for this year is to have peace      Patient Stated:  " Getting my diabetes under control and manage heart failure"      Interventions Today    Flowsheet Row Most Recent Value  Chronic Disease   Chronic disease during today's visit Diabetes, Hypertension (HTN), Congestive Heart Failure (CHF)  General Interventions   General Interventions Discussed/Reviewed General Interventions Reviewed, Doctor Visits  [evaluation of current treatment plan for Diabetes, HF, HTN and patients adherence to plan as established by provider. Assessed for blood pressure and blood sugar readings.]  Doctor Visits Discussed/Reviewed Doctor Visits Reviewed  Annabell Sabal upcoming provider visits. confirmed patient has transportation for appointment on 10/31/22.]  Education Interventions   Education Provided Provided Education  [Discussed hypoglycemia/ hyperglycemia. Discussed Rule of 15 treatment for hypoglycemia.  Reinforced need to monitor blood pressures and weight daily and record.]  Provided Verbal Education On Blood Sugar Monitoring, Other  [Encouraged patient to monitor blood sugars daily.  Advised to take blood sugar monitor to endocrinology visit scheduled for 10/31/22. Reinforced importance of not overtreating blood sugar.]  Mental Health Interventions   Mental Health Discussed/Reviewed Mental Health Reviewed, Coping Strategies  [discussed coping strategies related to stress: deep breathing exercises, remaining socially connected, more positive self talk.]  Nutrition Interventions   Nutrition  Discussed/Reviewed Nutrition Reviewed, Carbohydrate meal planning, Decreasing sugar intake  [Discussed diabetic diet/ food choices.]  Pharmacy Interventions   Pharmacy Dicussed/Reviewed Pharmacy Topics Reviewed  [medications reviewed and discussed. Reviewed insulin sliding scale.]               transportation options      Care Coordination Interventions: Patient will utilize a alternative transportation company to appointments to medical appointments Patient's granddaughter has agreed to transport patient to her next appointment on 10/31/22           This is a list of the screening recommended for you and due dates:  Health Maintenance  Topic Date Due   DTaP/Tdap/Td vaccine (1 - Tdap) Never done   COVID-19 Vaccine (5 - 2023-24 season) 02/16/2022   Yearly kidney health urinalysis for diabetes  04/19/2022   Medicare Annual Wellness Visit  11/28/2022   Complete foot exam   11/25/2022   Flu Shot  01/17/2023   Hemoglobin A1C  04/24/2023   Eye exam for diabetics  05/18/2023   Yearly kidney function blood test for diabetes  11/27/2023   Pneumonia Vaccine  Completed   Zoster (Shingles) Vaccine  Completed   HPV Vaccine  Aged Out   DEXA scan (bone density measurement)  Discontinued    Advanced directives: Advance directive discussed with you today. Even though you declined this today, please call our office should you change your mind, and we can give you the proper paperwork for you to fill out. Advance care planning is a way to make decisions about medical care that fits your values in case you are ever unable to make these decisions for yourself.  Information on Advanced Care Planning can be found at [facility] Advance Health Care Directives Advance Health Care Directives ([email or web address]    Conditions/risks identified: Aim for 30 minutes of exercise or brisk walking, 6-8 glasses of water, and 5 servings of fruits and vegetables each day.   Next appointment:  Follow up in one year for your annual wellness visit  December 03, 2023 at 10:30 telephone visit   Preventive Care 65 Years and Older, Female Preventive care refers to lifestyle choices and visits with your health care provider that can promote health and wellness. What does preventive care include? A yearly physical exam. This is also called an annual well check. Dental exams once or twice a year. Routine eye exams. Ask your health care provider how often you should have your eyes checked. Personal lifestyle choices, including: Daily care of your teeth and gums. Regular physical activity. Eating a healthy diet. Avoiding tobacco and drug use. Limiting alcohol use. Practicing safe sex. Taking low-dose aspirin every day. Taking vitamin and mineral supplements as recommended by your health care provider. What happens during an annual well check? The services and screenings done by your health care provider during your annual well check will depend on your age, overall health, lifestyle risk factors, and family history of disease. Counseling  Your health care provider may ask you questions about your: Alcohol use. Tobacco use. Drug use. Emotional well-being. Home and relationship well-being. Sexual activity. Eating habits. History of falls. Memory and ability to understand (cognition). Work and work Astronomer. Reproductive health. Screening  You may have the following tests or measurements: Height, weight, and BMI. Blood pressure. Lipid and cholesterol levels. These may be checked every 5 years, or more frequently if you are over 53 years old. Skin check. Lung cancer screening. You may have this screening every  year starting at age 29 if you have a 30-pack-year history of smoking and currently smoke or have quit within the past 15 years. Fecal occult blood test (FOBT) of the stool. You may have this test every year starting at age 58. Flexible sigmoidoscopy or colonoscopy. You may  have a sigmoidoscopy every 5 years or a colonoscopy every 10 years starting at age 81. Hepatitis C blood test. Hepatitis B blood test. Sexually transmitted disease (STD) testing. Diabetes screening. This is done by checking your blood sugar (glucose) after you have not eaten for a while (fasting). You may have this done every 1-3 years. Bone density scan. This is done to screen for osteoporosis. You may have this done starting at age 57. Mammogram. This may be done every 1-2 years. Talk to your health care provider about how often you should have regular mammograms. Talk with your health care provider about your test results, treatment options, and if necessary, the need for more tests. Vaccines  Your health care provider may recommend certain vaccines, such as: Influenza vaccine. This is recommended every year. Tetanus, diphtheria, and acellular pertussis (Tdap, Td) vaccine. You may need a Td booster every 10 years. Zoster vaccine. You may need this after age 28. Pneumococcal 13-valent conjugate (PCV13) vaccine. One dose is recommended after age 1. Pneumococcal polysaccharide (PPSV23) vaccine. One dose is recommended after age 27. Talk to your health care provider about which screenings and vaccines you need and how often you need them. This information is not intended to replace advice given to you by your health care provider. Make sure you discuss any questions you have with your health care provider. Document Released: 07/01/2015 Document Revised: 02/22/2016 Document Reviewed: 04/05/2015 Elsevier Interactive Patient Education  2017 [employer].  Fall Prevention in the Home Falls can cause injuries. They can happen to people of all ages. There are many things you can do to make your home safe and to help prevent falls. What can I do on the outside of my home? Regularly fix the edges of walkways and driveways and fix any cracks. Remove anything that might make you trip as you walk  through a door, such as a raised step or threshold. Trim any bushes or trees on the path to your home. Use bright outdoor lighting. Clear any walking paths of anything that might make someone trip, such as rocks or tools. Regularly check to see if handrails are loose or broken. Make sure that both sides of any steps have handrails. Any raised decks and porches should have guardrails on the edges. Have any leaves, snow, or ice cleared regularly. Use sand or salt on walking paths during winter. Clean up any spills in your garage right away. This includes oil or grease spills. What can I do in the bathroom? Use night lights. Install grab bars by the toilet and in the tub and shower. Do not use towel bars as grab bars. Use non-skid mats or decals in the tub or shower. If you need to sit down in the shower, use a plastic, non-slip stool. Keep the floor dry. Clean up any water that spills on the floor as soon as it happens. Remove soap buildup in the tub or shower regularly. Attach bath mats securely with double-sided non-slip rug tape. Do not have throw rugs and other things on the floor that can make you trip. What can I do in the bedroom? Use night lights. Make sure that you have a light by your bed that  is easy to reach. Do not use any sheets or blankets that are too big for your bed. They should not hang down onto the floor. Have a firm chair that has side arms. You can use this for support while you get dressed. Do not have throw rugs and other things on the floor that can make you trip. What can I do in the kitchen? Clean up any spills right away. Avoid walking on wet floors. Keep items that you use a lot in easy-to-reach places. If you need to reach something above you, use a strong step stool that has a grab bar. Keep electrical cords out of the way. Do not use floor polish or wax that makes floors slippery. If you must use wax, use non-skid floor wax. Do not have throw rugs and  other things on the floor that can make you trip. What can I do with my stairs? Do not leave any items on the stairs. Make sure that there are handrails on both sides of the stairs and use them. Fix handrails that are broken or loose. Make sure that handrails are as long as the stairways. Check any carpeting to make sure that it is firmly attached to the stairs. Fix any carpet that is loose or worn. Avoid having throw rugs at the top or bottom of the stairs. If you do have throw rugs, attach them to the floor with carpet tape. Make sure that you have a light switch at the top of the stairs and the bottom of the stairs. If you do not have them, ask someone to add them for you. What else can I do to help prevent falls? Wear shoes that: Do not have high heels. Have rubber bottoms. Are comfortable and fit you well. Are closed at the toe. Do not wear sandals. If you use a stepladder: Make sure that it is fully opened. Do not climb a closed stepladder. Make sure that both sides of the stepladder are locked into place. Ask someone to hold it for you, if possible. Clearly mark and make sure that you can see: Any grab bars or handrails. First and last steps. Where the edge of each step is. Use tools that help you move around (mobility aids) if they are needed. These include: Canes. Walkers. Scooters. Crutches. Turn on the lights when you go into a dark area. Replace any light bulbs as soon as they burn out. Set up your furniture so you have a clear path. Avoid moving your furniture around. If any of your floors are uneven, fix them. If there are any pets around you, be aware of where they are. Review your medicines with your doctor. Some medicines can make you feel dizzy. This can increase your chance of falling. Ask your doctor what other things that you can do to help prevent falls. This information is not intended to replace advice given to you by your health care provider. Make sure you  discuss any questions you have with your health care provider. Document Released: 03/31/2009 Document Revised: 11/10/2015 Document Reviewed: 07/09/2014 Elsevier Interactive Patient Education  2017 [employer].

## 2022-11-29 NOTE — Progress Notes (Signed)
 I connected with  Alexis Farmer on 11/29/22 by a audio enabled telemedicine application and verified that I am speaking with the correct person using two identifiers.  Patient Location: Home  Provider Location: Home Office  I discussed the limitations of evaluation and management by telemedicine. The patient expressed understanding and agreed to proceed.  Patient unable to name medications during medication reconciliation.  Subjective:   Alexis Farmer is a 82 y.o. female who presents for Medicare Annual (Subsequent) preventive examination.  Review of Systems     Cardiac Risk Factors include: advanced age (>16men, >48 women);diabetes mellitus;dyslipidemia;hypertension;sedentary lifestyle;obesity (BMI >30kg/m2)     Objective:    Today's Vitals   11/29/22 1033 11/29/22 1034  Weight: 249 lb (112.9 kg)   Height: 5\' 3"  (1.6 m)   PainSc:  10-Worst pain ever   Body mass index is 44.11 kg/m.     11/29/2022   10:40 AM 10/31/2022    2:18 PM 10/22/2022    4:04 PM 04/10/2022   12:51 PM 01/04/2022    1:45 PM 12/26/2021   11:11 AM 11/27/2021   10:45 AM  Advanced Directives  Does Patient Have a Medical Advance Directive? No No No No No No No  Would patient like information on creating a medical advance directive? No - Patient declined No - Patient declined No - Patient declined No - Patient declined   No - Patient declined    Current Medications (verified) Outpatient Encounter Medications as of 11/29/2022  Medication Sig   acetaminophen (TYLENOL) 500 MG tablet Take 500 mg by mouth every 6 (six) hours as needed for mild pain or headache.   Ascorbic Acid (VITAMIN C PO) Take 1 tablet by mouth daily.   aspirin EC 81 MG tablet Take 81 mg by mouth 2 (two) times a week. Swallow whole.   BD INSULIN SYRINGE U/F 30G X 1/2" 0.5 ML MISC USE UP TO 5X PER DAY WITH INSULIN 2X (NPH) AND 3X (REGULAR)   blood glucose meter kit and supplies KIT Accu chek, Dx code E11.65, check 3 times daily   Blood  Pressure KIT 1 Device by Does not apply route daily.   buPROPion ER (WELLBUTRIN SR) 100 MG 12 hr tablet Take 1 tablet (100 mg total) by mouth 2 (two) times daily. (Patient not taking: Reported on 10/30/2022)   cloNIDine (CATAPRES) 0.2 MG tablet Take 1 tablet (0.2 mg total) by mouth 2 (two) times daily. (Patient taking differently: Take 0.2 mg by mouth 3 (three) times daily.)   glucose blood (ACCU-CHEK GUIDE) test strip USE AS INSTRUCTED 3 TIMES DAILY   hydrALAZINE (APRESOLINE) 100 MG tablet TAKE 1 TABLET (100 MG TOTAL) BY MOUTH 4 (FOUR) TIMES DAILY.   insulin NPH Human (NOVOLIN N) 100 UNIT/ML injection INJECT 20 UNITS IN THE MORNING AND THE EVENING WITH A MEAL (Patient taking differently: 40 Units 2 (two) times daily before a meal.)   insulin regular (NOVOLIN R) 100 units/mL injection Inject 0-0.08 mLs (0-8 Units total) into the skin 3 (three) times daily before meals.   LORazepam (ATIVAN) 1 MG tablet Take 0.5 tablets (0.5 mg total) by mouth daily as needed for anxiety. Inform pt 0.5 dose on backorder has to cut this in 1/2 dose   meclizine (ANTIVERT) 25 MG tablet Take 1 tablet (25 mg total) by mouth 3 (three) times daily as needed for dizziness.   Multiple Vitamins-Minerals (CENTRUM SILVER 50+WOMEN) TABS Take 1 tablet by mouth daily with breakfast.   nebivolol (BYSTOLIC) 10 MG tablet Take  1 tablet (10 mg total) by mouth daily.   nystatin (MYCOSTATIN/NYSTOP) powder Apply topically 2 (two) times daily. Under abdomen   ondansetron (ZOFRAN-ODT) 4 MG disintegrating tablet Take 1 tablet (4 mg total) by mouth every 8 (eight) hours as needed for nausea or vomiting.   polyethylene glycol powder (GLYCOLAX/MIRALAX) 17 GM/SCOOP powder Take 17 g by mouth daily as needed for moderate constipation or severe constipation. Can take up to 2x per day prn. Mix with 8 ounces of liquid   rosuvastatin (CRESTOR) 20 MG tablet TAKE 1 TABLET BY MOUTH EVERY DAY   spironolactone (ALDACTONE) 25 MG tablet Take 1 tablet (25 mg  total) by mouth daily.   torsemide (DEMADEX) 10 MG tablet Take 1 tablet (10 mg total) by mouth daily.   valsartan (DIOVAN) 160 MG tablet Take 1 tablet (160 mg total) by mouth daily.   No facility-administered encounter medications on file as of 11/29/2022.    Allergies (verified) Celexa [citalopram], Jardiance [empagliflozin], Norvasc [amlodipine], Tape, Penicillin v, Penicillin v potassium, and Penicillins   History: Past Medical History:  Diagnosis Date   Anxiety 09/13/2015   Arthritis    Atypical chest pain 01/15/2021   Blackhead 10/24/2021   Bradycardia 07/17/2016   Formatting of this note might be different from the original.  Last Assessment & Plan:   Found to be bradycardic at outside hospital. They stopped metoprolol and verapamil and heart rate has been normal since then. Echo appears to have been reassuring at the outside facility. Appears to be doing much better with regards to this. She'll continue to monitor for recurrent symptoms. 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  Last Assessment & Plan:   Continues to have issues with this.  She is following with a specialist and it sounds as though they are planning on an MRI.  Benign exam today.   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 Loletha Carrow of this note might be different from the original.  Dr. Thedore Mins      Last Assessment & Plan:   Recheck kidney function today.   Constipation 11/17/2019   Cough 01/25/2016   Formatting of this note might be different from the original.  Last Assessment & Plan:   Minimal nighttime cough. Improves when she washes her pillows consistently. Suspect allergies contributing. She'll monitor.   Diabetes mellitus without complication (HCC)    one elevated reading/ no treatment   Disorder of rotator cuff 06/24/2018   Diverticulitis    Dysuria 03/22/2017    Formatting of this note might be different from the original.  Last Assessment & Plan:   Symptoms concerning for UTI.  Will check urinalysis.   Edema of foot 04/08/2014   Formatting of this note might be different from the original.  Last Assessment & Plan:   Chronic pedal edema. Suspect venous insufficiency. No orthopnea or shortness of breath. Advised elevation of her legs. Consider compression stockings in the future.   Elevated troponin 12/15/2020   Essential hypertension 04/08/2014   Formatting of this note might be different from the original.  Last Assessment & Plan:   Well-controlled on recheck.  Continue current regimen.   Fall 03/26/2018   Gastroenteritis 08/12/2021   Gastrointestinal hemorrhage 08/03/2015   GI bleed    High cholesterol    History of blood transfusion    Hyperglycemia due to  type 2 diabetes mellitus (HCC) 03/11/2015   Hypertension    Hypertensive urgency 12/15/2020   Low back pain 04/08/2014   Morbid obesity (HCC) 04/08/2014   Formatting of this note might be different from the original.  Last Assessment & Plan:   Weight is stable. Discussed diet and exercise at length. Encouraged whatever exercise she can do. Given diet information.   Palpitations 12/15/2020   Peripheral edema 12/22/2019   Postmenopausal bleeding 07/17/2016   Formatting of this note might be different from the original.  Last Assessment & Plan:   Recent D&C. Following with gynecology. Benign findings. Monitor for recurrence.   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  Last Assessment & Plan:   Continues to have issues with this.  She is following with a specialist and it sounds as though they are planning on an MRI.  Benign exam today.   Recurrent falls 08/13/2019   Renal insufficiency    Skin cyst 10/24/2021   Stage 3a chronic kidney disease  (HCC) 01/15/2021   Swelling of lower leg 07/19/2016   Vertigo 10/13/2015   Formatting of this note might be different from the original.  Last Assessment & Plan:   No recurrence. Discussed that meclizine is an as needed medication and that she does not need to take this daily. She will continue to monitor.   Past Surgical History:  Procedure Laterality Date   APPENDECTOMY     CHOLECYSTECTOMY     ECTOPIC PREGNANCY SURGERY     EYE SURGERY     bilateral cataracts   EYE SURGERY     02/11/2019 repair hole in right eye    gallbladder      HYSTEROSCOPY WITH D & C N/A 10/26/2016   Procedure: DILATATION AND CURETTAGE /HYSTEROSCOPY;  Surgeon: Christeen Douglas, MD;  Location: [facility] ORS;  Service: Gynecology;  Laterality: N/A;   HYSTEROSCOPY WITH D & C N/A 07/07/2018   Procedure: DILATATION AND CURETTAGE /HYSTEROSCOPY;  Surgeon: Christeen Douglas, MD;  Location: [facility] ORS;  Service: Gynecology;  Laterality: N/A;   THYROIDECTOMY, PARTIAL     Family History  Problem Relation Age of Onset   Diabetes Mother    Hypertension Mother    Stroke Mother    Diabetes Other    Healthy Father    Diabetes Sister    Heart disease Sister    Social History   Socioeconomic History   Marital status: Widowed    Spouse name: Not on file   Number of children: 1   Years of education: 42   Highest education level: 12th grade  Occupational History   Occupation: retired    Comment: hx of Retail banker, Advertising copywriter, 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  Tobacco Use   Smoking status: Never   Smokeless tobacco: Never  Vaping Use   Vaping Use: Never used  Substance and Sexual Activity   Alcohol use: No   Drug use: No   Sexual activity: Not Currently  Other Topics Concern   Not on file  Social History Narrative   Lives alone    From [place]   Widowed - was married 3 times starting at age 20    hx of seamtress, 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    Social Determinants of Health   Financial Resource Strain: Low Risk  (11/29/2022)   Overall  Financial Resource Strain (CARDIA)    Difficulty of Paying Living Expenses: Not hard at all  Food Insecurity: No Food Insecurity (11/29/2022)   Hunger Vital Sign    Worried About Running Out of Food in the Last Year: Never true    Ran Out of Food in the Last Year: Never true  Transportation Needs: Unmet Transportation Needs (11/29/2022)   PRAPARE - Administrator, Civil Service (Medical): Yes    Lack of Transportation (Non-Medical): No  Physical Activity: Inactive (11/29/2022)   Exercise Vital Sign    Days of Exercise per Week: 0 days    Minutes of Exercise per Session: 0 min  Stress: Stress Concern Present (11/29/2022)   [employer] of Occupational Health - Occupational Stress Questionnaire    Feeling of Stress : To some extent  Social Connections: Socially Integrated (11/29/2022)   Social Connection and Isolation Panel [NHANES]    Frequency of Communication with Friends and Family: More than three times a week    Frequency of Social Gatherings with Friends and Family: More than three times a week    Attends Religious Services: More than 4 times per year    Active Member of [employer] or Organizations: Yes    Attends Engineer, structural: More than 4 times per year    Marital Status: Married    Tobacco Counseling Counseling given: Yes   Clinical Intake:  Pre-visit preparation completed: Yes  Pain : 0-10 Pain Score: 10-Worst pain ever Pain Type: Chronic pain Pain Location: Other (Comment) (patient states her entire body is sore) Pain Orientation: Other (Comment) Pain Descriptors / Indicators: Other (Comment), Aching Pain Onset: More than a month ago Pain Frequency: Constant     BMI - recorded: 44.11 Nutritional Status: BMI > 30  Obese Nutritional Risks: None Diabetes: Yes CBG done?: No Did pt. bring in CBG monitor from home?: No  How often do you need  to have someone help you when you read instructions, pamphlets, or other written materials from your doctor or pharmacy?: 1 - Never  Diabetic?yes Nutrition Risk Assessment:  Has the patient had any N/V/D within the last 2 months?  No  Does the patient have any non-healing wounds?  No  Has the patient had any unintentional weight loss or weight gain?  No   Diabetes:  Is the patient diabetic?  No  If diabetic, was a CBG obtained today?  No  Did the patient bring in their glucometer from home?  No  How often do you monitor your CBG's? daily.   Financial Strains and Diabetes Management:  Are you having any financial strains with the device, your supplies or your medication? No .  Does the patient want to be seen by Chronic Care Management for management of their diabetes?  No  Would the patient like to be referred to a Nutritionist or for Diabetic Management?  No   Diabetic Exams:  Diabetic Eye Exam: Completed 05/17/2022 Diabetic Foot Exam: Overdue, Pt has been advised about the importance in completing this exam. Pt is scheduled for diabetic foot exam on message sent.   Interpreter Needed?: No  Information entered by ::  Borghild Thaker CMA   Activities of Daily Living    11/29/2022   10:42 AM 10/22/2022    4:04 PM  In your present state of health, do you have any difficulty performing the following activities:  Hearing? 0 0  Vision? 0 0  Difficulty concentrating or making decisions? 0 0  Walking or  climbing stairs? 1 1  Comment chronic pain   Dressing or bathing? 1 0  Comment chronic pain   Doing errands, shopping? 1 1  Comment no longer drives. relies on Facilities manager and eating ? N   Using the Toilet? N   In the past six months, have you accidently leaked urine? N   Do you have problems with loss of bowel control? N   Managing your Medications? N   Managing your Finances? N   Housekeeping or managing your Housekeeping? N     Patient Care  Team: [name], MD as PCP - General (Family Medicine) O'Neal, Ronnald Ramp, MD as Consulting Physician (Internal Medicine) Mosetta Pigeon, MD (Nephrology) Christeen Douglas, MD as Consulting Physician (Obstetrics and Gynecology) Christeen Douglas, MD as Consulting Physician (Obstetrics and Gynecology) Freddie Breech, DPM as Consulting Physician (Podiatry) Freddie Breech, DPM as Consulting Physician (Podiatry) Mariah Milling, Tollie Pizza, MD as Consulting Physician (Cardiology) Freddie Breech, DPM as Consulting Physician (Podiatry) Otho Ket, RN as [facility] Care Management  Indicate any recent Medical Services you may have received from other than [facility] providers in the past year (date may be approximate).     Assessment:   This is a routine wellness examination for Athalee.  Hearing/Vision screen Hearing Screening - Comments:: Patient denies any hearing difficulties.   Vision Screening - Comments:: Wears rx glasses - patient waiting on insurance to send her a list of eye care providers they will cover   Dietary issues and exercise activities discussed: Current Exercise Habits: The patient does not participate in regular exercise at present, Exercise limited by: orthopedic condition(s)   Goals Addressed               This Visit's Progress     Patient Stated (pt-stated)        Patient states her goal for this year is to have peace       Depression Screen    11/29/2022   10:37 AM 09/04/2022   11:27 AM 05/14/2022   10:35 AM 03/30/2022   12:08 PM 03/21/2022   11:36 AM 12/13/2021    9:33 AM 11/27/2021   10:46 AM  PHQ 2/9 Scores  PHQ - 2 Score 0 0 1 1  1  0  Exception Documentation     Patient refusal      Fall Risk    11/29/2022   10:40 AM 09/04/2022   11:26 AM 07/18/2022   11:04 AM 03/21/2022   11:36 AM 12/13/2021    9:33 AM  Fall Risk   Falls in the past year? 1 0 0 0 0  Number falls in past yr: 1 0 0 0 0  Injury with Fall? 1 0 0 0 0   Risk for fall due to : History of fall(s);Impaired balance/gait;Orthopedic patient No Fall Risks No Fall Risks No Fall Risks No Fall Risks  Follow up Education provided;Falls prevention discussed Falls evaluation completed Falls evaluation completed Falls evaluation completed Falls evaluation completed    FALL RISK PREVENTION PERTAINING TO THE HOME:  Any stairs in or around the home? No  If so, are there any without handrails? No  Home free of loose throw rugs in walkways, pet beds, electrical cords, etc? Yes  Adequate lighting in your home to reduce risk of falls? Yes   ASSISTIVE DEVICES UTILIZED TO PREVENT FALLS:  Life alert? No  Use of a cane, walker or w/c? Yes  Grab bars in the bathroom?  Yes  Shower chair or bench in shower? Yes  Elevated toilet seat or a handicapped toilet? No   TIMED UP AND GO:  Was the test performed? No .  Cognitive Function:        11/29/2022   10:42 AM 11/27/2021   10:43 AM 11/24/2020   10:49 AM 04/17/2017    9:40 AM  6CIT Screen  What Year? 0 points 0 points 0 points 0 points  What month? 0 points 0 points 0 points 0 points  What time? 0 points 0 points 0 points 0 points  Count back from 20 0 points 0 points 0 points 0 points  Months in reverse 0 points 0 points 0 points 0 points  Repeat phrase 0 points 6 points 0 points 0 points  Total Score 0 points 6 points 0 points 0 points    Immunizations Immunization History  Administered Date(s) Administered   Fluad Quad(high Dose 65+) 03/12/2019, 03/23/2020, 04/19/2021, 03/21/2022   Influenza Split 03/23/2020   Influenza, High Dose Seasonal PF 03/22/2017, 03/25/2018   Influenza-Unspecified 03/19/2015, 06/24/2016   PFIZER Comirnaty(Gray Top)Covid-19 Tri-Sucrose Vaccine 11/26/2020   PFIZER(Purple Top)SARS-COV-2 Vaccination 08/09/2019, 09/02/2019, 06/28/2020   PNEUMOCOCCAL CONJUGATE-20 07/18/2022   Zoster Recombinat (Shingrix) 07/06/2020, 09/10/2020    TDAP status: Due, Education has been provided  regarding the importance of this vaccine. Advised may receive this vaccine at local pharmacy or Health Dept. Aware to provide a copy of the vaccination record if obtained from local pharmacy or Health Dept. Verbalized acceptance and understanding.  Flu Vaccine status: Up to date  Pneumococcal vaccine status: Up to date  Covid-19 vaccine status: Information provided on how to obtain vaccines.   Qualifies for Shingles Vaccine? Yes   Zostavax completed Yes   Shingrix Completed?: Yes  Screening Tests Health Maintenance  Topic Date Due   DTaP/Tdap/Td (1 - Tdap) Never done   COVID-19 Vaccine (5 - 2023-24 season) 02/16/2022   Diabetic kidney evaluation - Urine ACR  04/19/2022   Medicare Annual Wellness (AWV)  11/28/2022   FOOT EXAM  11/25/2022   INFLUENZA VACCINE  01/17/2023   HEMOGLOBIN A1C  04/24/2023   OPHTHALMOLOGY EXAM  05/18/2023   Diabetic kidney evaluation - eGFR measurement  11/27/2023   Pneumonia Vaccine 68+ Years old  Completed   Zoster Vaccines- Shingrix  Completed   HPV VACCINES  Aged Out   DEXA SCAN  Discontinued    Health Maintenance  Health Maintenance Due  Topic Date Due   DTaP/Tdap/Td (1 - Tdap) Never done   COVID-19 Vaccine (5 - 2023-24 season) 02/16/2022   Diabetic kidney evaluation - Urine ACR  04/19/2022   Medicare Annual Wellness (AWV)  11/28/2022   FOOT EXAM  11/25/2022    Colorectal cancer screening: No longer required.   Mammogram status: No longer required due to age.  Bone Density status: Ordered 03/21/2022. Pt provided with contact info and advised to call to schedule appt.  Lung Cancer Screening: (Low Dose CT Chest recommended if Age 3-80 years, 30 pack-year currently smoking OR have quit w/in 15years.) does not qualify.   Additional Screening:  Hepatitis C Screening: does not qualify; Vision Screening: Recommended annual ophthalmology exams for early detection of glaucoma and other disorders of the eye. Is the patient up to date with their  annual eye exam?  Yes  Who is the provider or what is the name of the office in which the patient attends annual eye exams? Currently waiting on list of approved providers from insurance company If  pt is not established with a provider, would they like to be referred to a provider to establish care? No .   Dental Screening: Recommended annual dental exams for proper oral hygiene  Community Resource Referral / Chronic Care Management: CRR required this visit?  No   CCM required this visit?  No      Plan:     I have personally reviewed and noted the following in the patient's chart:   Medical and social history Use of alcohol, tobacco or illicit drugs  Current medications and supplements including opioid prescriptions. Patient is not currently taking opioid prescriptions. Functional ability and status Nutritional status Physical activity Advanced directives List of other physicians Hospitalizations, surgeries, and ER visits in previous 12 months Vitals Screenings to include cognitive, depression, and falls Referrals and appointments  In addition, I have reviewed and discussed with patient certain preventive protocols, quality metrics, and best practice recommendations. A written personalized care plan for preventive services as well as general preventive health recommendations were provided to patient.   Due to this being a telephonic visit, the after visit summary with patients personalized plan was offered to patient via mail or my-chart. Per request, patient was mailed a copy of their AVS.   Jordan Hawks Theophilus Walz, CMA   11/29/2022   Nurse Notes: Patient unable to name medications.

## 2022-11-29 NOTE — Telephone Encounter (Signed)
   Telephone encounter was:  Successful.  11/29/2022 Name: Alexis Farmer MRN: [id number] DOB: Nov 09, 1940  Alexis Farmer is a 82 y.o. year old female who is a primary care patient of [name], MD . The community resource team was consulted for assistance with Transportation Needs   Care guide performed the following interventions: Patient provided with information about care guide support team and interviewed to confirm resource needs.Patient needs transportation for 6/19 and has been accepted by safe ride   Follow Up Plan:  No further follow up planned at this time. The patient has been provided with needed resources.    Lenard Forth [facility] Guide, [facility] [phone] [address] [place], [place], [place] [address] Phone: [phone] Email: [email or web address]

## 2022-12-03 ENCOUNTER — Telehealth: Payer: Self-pay | Admitting: *Deleted

## 2022-12-03 NOTE — Telephone Encounter (Signed)
Transition Care Management Follow-up Telephone Call Date of discharge and from where: [facility] 11/27/2022 How have you been since you were released from the hospital? Still the same nobody can fix it have been to hospital and to pcp still more tests and nobody knows  Any questions or concerns? No Still the same nobody can fix it have been to hospital and to pcp still more tests and nobody knows Items Reviewed: Did the pt receive and understand the discharge instructions provided? Yes  Medications obtained and verified? No  Other? No  Any new allergies since your discharge? No  Dietary orders reviewed? No Do you have support at home? No    Follow up appointments reviewed:  PCP Hospital f/u appt confirmed? No  Patient refused already been to hosp 2x and they do tests nobody knows  Are transportation arrangements needed? No  If their condition worsens, is the pt aware to call PCP or go to the Emergency Dept.? Yes Was the patient provided with contact information for the PCP's office or ED? Yes Was to pt encouraged to call back with questions or concerns? Yes

## 2022-12-03 NOTE — Telephone Encounter (Signed)
   Telephone encounter was:  Unsuccessful.  12/03/2022 Name: Alexis Farmer MRN: [id number] DOB: 1940/08/15  Unsuccessful outbound call made today to assist with:  Transportation Needs   Outreach Attempt:  1st Attempt  A HIPAA compliant voice message was left requesting a return call. .Patient canceled ride on concierge line  Comer Devins Greenauer -[facility] [place], Population Health [phone] [address] , [place] [address] Email : [email or web address]

## 2022-12-05 ENCOUNTER — Ambulatory Visit: Payer: Medicare Other | Admitting: Family Medicine

## 2022-12-07 ENCOUNTER — Ambulatory Visit: Payer: Self-pay

## 2022-12-07 NOTE — Patient Outreach (Signed)
  Care Coordination   Follow Up Visit Note   12/07/2022 Name: Alexis Farmer MRN: [id number] DOB: Dec 13, 1940  Alisha Bacus is a 82 y.o. year old female who sees [name], MD for primary care. I spoke with  [name] by phone today.  What matters to the patients health and wellness today?  Patient reports second bout of dizziness that sent her to the ED on 11/27/22.  Patient states there were no findings from her MRI regarding reason for dizziness.  She states she is still having some mild dizziness off and on. She state the medication prescribed for the dizziness makes her feel bad.  She states, " I have no energy to do anything." Patient denied having any nausea currently.  Patient states she missed her follow up appointment with the PCP due to transportation issues.  She states she will request transport at least 30 minutes earlier to see if this helps.     Goals Addressed             This Visit's Progress    management of vertigo       Interventions Today    Flowsheet Row Most Recent Value  Chronic Disease   Chronic disease during today's visit Other  [dizziness]  General Interventions   General Interventions Discussed/Reviewed General Interventions Reviewed, Doctor Visits  [evaluation of current treatment plan for dizziness and patients adherence to plan as established by provider.  Assessed for ongoing dizziness and/or nausea symptoms.]  Doctor Visits Discussed/Reviewed Doctor Visits Reviewed  Annabell Sabal upcoming/ scheduled provider visits.  Advised to reschedule follow up appointment with primary provider for recent ED visit]  Education Interventions   Education Provided Provided Education, Provided Printed Education  [Advised to notify provider for ongoing dizziness symptoms. Sent patient education article regarding vertigo]  Pharmacy Interventions   Pharmacy Dicussed/Reviewed Pharmacy Topics Reviewed  [medications reviewed and compliance discussed. Discussed effects  of medication meclizine]  Safety Interventions   Safety Discussed/Reviewed Fall Risk  [Discussed fall risk due to medication]              SDOH assessments and interventions completed:  No     Care Coordination Interventions:  Yes, provided   Follow up plan: Follow up call scheduled for 01/11/23    Encounter Outcome:  Pt. Visit Completed   [name] RN,BSN,CCM [facility] Care Coordination [phone] direct line

## 2022-12-07 NOTE — Patient Instructions (Signed)
Visit Information  Thank you for taking time to visit with me today. Please don't hesitate to contact me if I can be of assistance to you.   Following are the goals we discussed today:   Goals Addressed             This Visit's Progress    management of vertigo       Interventions Today    Flowsheet Row Most Recent Value  Chronic Disease   Chronic disease during today's visit Other  [dizziness]  General Interventions   General Interventions Discussed/Reviewed General Interventions Reviewed, Doctor Visits  [evaluation of current treatment plan for dizziness and patients adherence to plan as established by provider.  Assessed for ongoing dizziness and/or nausea symptoms.]  Doctor Visits Discussed/Reviewed Doctor Visits Reviewed  Annabell Sabal upcoming/ scheduled provider visits.  Advised to reschedule follow up appointment with primary provider for recent ED visit]  Education Interventions   Education Provided Provided Education, Provided Printed Education  [Advised to notify provider for ongoing dizziness symptoms. Sent patient education article regarding vertigo]  Pharmacy Interventions   Pharmacy Dicussed/Reviewed Pharmacy Topics Reviewed  [medications reviewed and compliance discussed. Discussed effects of medication meclizine]  Safety Interventions   Safety Discussed/Reviewed Fall Risk  [Discussed fall risk due to medication]              Our next appointment is by telephone on 01/11/23 at 10 am  Please call the care guide team at [phone] if you need to cancel or reschedule your appointment.   If you are experiencing a Mental Health or Behavioral Health Crisis or need someone to talk to, please call the Suicide and Crisis Lifeline: 988 call 1-800-273-TALK (toll free, 24 hour hotline)  The patient verbalized understanding of instructions, educational materials, and care plan provided today and agreed to receive a mailed copy of patient instructions, educational materials,  and care plan.   [name] RN,BSN,CCM [facility] Care Coordination [phone] direct line   Vertigo Vertigo is the feeling that you or your surroundings are moving when they are not. This feeling can come and go at any time. Vertigo often goes away on its own. Vertigo can be dangerous if it occurs while you are doing something that could endanger yourself or others, such as driving or operating machinery. Your health care provider will do tests to try to determine the cause of your vertigo. Tests will also help your health care provider decide how best to treat your condition. Follow these instructions at home: Eating and drinking     Dehydration can make vertigo worse. Drink enough fluid to keep your urine pale yellow. Do not drink alcohol. Activity Return to your normal activities as told by your health care provider. Ask your health care provider what activities are safe for you. In the morning, first sit up on the side of the bed. When you feel okay, stand slowly while you hold onto something until you know that your balance is fine. Move slowly. Avoid sudden body or head movements or certain positions, as told by your health care provider. If you have trouble walking or keeping your balance, try using a cane for stability. If you feel dizzy or unstable, sit down right away. Avoid doing any tasks that would cause danger to you or others if vertigo occurs. Avoid bending down if you feel dizzy. Place items in your home so that they are easy for you to reach without bending or leaning over. Do not drive or use machinery  if you feel dizzy. General instructions Take over-the-counter and prescription medicines only as told by your health care provider. Keep all follow-up visits. This is important. Contact a health care provider if: Your medicines do not relieve your vertigo or they make it worse. Your condition gets worse or you develop new symptoms. You have a fever. You develop nausea  or vomiting, or if nausea gets worse. Your family or friends notice any behavioral changes. You have numbness or a prickling and tingling sensation in part of your body. Get help right away if you: Are always dizzy or you faint. Develop severe headaches. Develop a stiff neck. Develop sensitivity to light. Have difficulty moving or speaking. Have weakness in your hands, arms, or legs. Have changes in your hearing or vision. These symptoms may represent a serious problem that is an emergency. Do not wait to see if the symptoms will go away. Get medical help right away. Call your local emergency services (911 in the [place] Do not drive yourself to the hospital. Summary Vertigo is the feeling that you or your surroundings are moving when they are not. Your health care provider will do tests to try to determine the cause of your vertigo. Follow instructions for home care. You may be told to avoid certain tasks, positions, or movements. Contact a health care provider if your medicines do not relieve your symptoms, or if you have a fever, nausea, vomiting, or changes in behavior. Get help right away if you have severe headaches or difficulty speaking, or you develop hearing or vision problems. This information is not intended to replace advice given to you by your health care provider. Make sure you discuss any questions you have with your health care provider. Document Revised: 05/04/2020 Document Reviewed: 05/04/2020 Elsevier Patient Education  2024 [employer].

## 2022-12-18 ENCOUNTER — Encounter (HOSPITAL_COMMUNITY): Payer: Medicare Other

## 2022-12-18 ENCOUNTER — Ambulatory Visit: Payer: Medicare Other | Admitting: Vascular Surgery

## 2022-12-18 ENCOUNTER — Other Ambulatory Visit: Payer: Self-pay | Admitting: Family Medicine

## 2022-12-18 ENCOUNTER — Telehealth: Payer: Self-pay | Admitting: Family Medicine

## 2022-12-18 DIAGNOSIS — Z7982 Long term (current) use of aspirin: Secondary | ICD-10-CM

## 2022-12-18 DIAGNOSIS — E1122 Type 2 diabetes mellitus with diabetic chronic kidney disease: Secondary | ICD-10-CM | POA: Diagnosis not present

## 2022-12-18 DIAGNOSIS — Z794 Long term (current) use of insulin: Secondary | ICD-10-CM

## 2022-12-18 DIAGNOSIS — E1165 Type 2 diabetes mellitus with hyperglycemia: Secondary | ICD-10-CM

## 2022-12-18 DIAGNOSIS — K5900 Constipation, unspecified: Secondary | ICD-10-CM

## 2022-12-18 DIAGNOSIS — H543 Unqualified visual loss, both eyes: Secondary | ICD-10-CM

## 2022-12-18 DIAGNOSIS — Z604 Social exclusion and rejection: Secondary | ICD-10-CM

## 2022-12-18 DIAGNOSIS — E042 Nontoxic multinodular goiter: Secondary | ICD-10-CM

## 2022-12-18 DIAGNOSIS — M1990 Unspecified osteoarthritis, unspecified site: Secondary | ICD-10-CM

## 2022-12-18 DIAGNOSIS — N1832 Chronic kidney disease, stage 3b: Secondary | ICD-10-CM | POA: Diagnosis not present

## 2022-12-18 DIAGNOSIS — I130 Hypertensive heart and chronic kidney disease with heart failure and stage 1 through stage 4 chronic kidney disease, or unspecified chronic kidney disease: Secondary | ICD-10-CM | POA: Diagnosis not present

## 2022-12-18 DIAGNOSIS — R001 Bradycardia, unspecified: Secondary | ICD-10-CM

## 2022-12-18 DIAGNOSIS — E7800 Pure hypercholesterolemia, unspecified: Secondary | ICD-10-CM

## 2022-12-18 DIAGNOSIS — E1151 Type 2 diabetes mellitus with diabetic peripheral angiopathy without gangrene: Secondary | ICD-10-CM

## 2022-12-18 DIAGNOSIS — I5033 Acute on chronic diastolic (congestive) heart failure: Secondary | ICD-10-CM | POA: Diagnosis not present

## 2022-12-18 DIAGNOSIS — Z6841 Body Mass Index (BMI) 40.0 and over, adult: Secondary | ICD-10-CM

## 2022-12-18 DIAGNOSIS — F419 Anxiety disorder, unspecified: Secondary | ICD-10-CM

## 2022-12-18 DIAGNOSIS — M4722 Other spondylosis with radiculopathy, cervical region: Secondary | ICD-10-CM

## 2022-12-18 MED ORDER — BD INSULIN SYRINGE U/F 30G X 1/2" 0.5 ML MISC
12 refills | Status: DC
Start: 2022-12-18 — End: 2024-05-11

## 2022-12-18 NOTE — Telephone Encounter (Signed)
Prescription Request  12/18/2022  LOV: 10/24/2022  What is the name of the medication or equipment? BD INSULIN SYRINGE U/F 30G X 1/2" 0.5 ML MISC  Have you contacted your pharmacy to request a refill? Yes   Which pharmacy would you like this sent to?   [id number] [place], [place] - [address] [place] [address] Phone: [phone] Fax: [phone]    Patient notified that their request is being sent to the clinical staff for review and that they should receive a response within 2 business days.   Please advise at Mobile [phone] (mobile)

## 2022-12-19 NOTE — Progress Notes (Signed)
Chart was reviewed and it appears during patient's ED visit she was complaining of some cough and shortness of breath, given the ongoing pandemic, a viral panel including COVID flu and RSV were checked.

## 2022-12-21 ENCOUNTER — Telehealth: Payer: Self-pay

## 2022-12-21 NOTE — Telephone Encounter (Signed)
Left message advising transportation request has been received for 7/9 and that it has been arranged. Advised that patient should receive a confirmation call the day before the transportation date.   Smitty Knudsen Gonzales  [facility] Population Health Manager, Patient and [facility] Transformation

## 2022-12-25 ENCOUNTER — Ambulatory Visit (INDEPENDENT_AMBULATORY_CARE_PROVIDER_SITE_OTHER): Payer: Medicare Other | Admitting: Podiatry

## 2022-12-25 VITALS — BP 203/72

## 2022-12-25 DIAGNOSIS — M79675 Pain in left toe(s): Secondary | ICD-10-CM

## 2022-12-25 DIAGNOSIS — E1142 Type 2 diabetes mellitus with diabetic polyneuropathy: Secondary | ICD-10-CM | POA: Diagnosis not present

## 2022-12-25 DIAGNOSIS — M79674 Pain in right toe(s): Secondary | ICD-10-CM | POA: Diagnosis not present

## 2022-12-25 DIAGNOSIS — I739 Peripheral vascular disease, unspecified: Secondary | ICD-10-CM

## 2022-12-25 DIAGNOSIS — B351 Tinea unguium: Secondary | ICD-10-CM

## 2022-12-27 ENCOUNTER — Telehealth: Payer: Self-pay | Admitting: *Deleted

## 2022-12-27 NOTE — Telephone Encounter (Signed)
Telephone encounter was:  Successful.  12/27/2022 Name: Alexis Farmer MRN: [id number] DOB: [birth year]  Alexis Farmer is a 82 y.o. year old female who is a primary care patient of [name], MD . The community resource team was consulted for assistance with Transportation Needs   Care guide performed the following interventions: Patient provided with information about care guide support team and interviewed to confirm resource needs.  Follow Up Plan:  No further follow up planned at this time. The patient has been provided with needed resources.  Yehuda Mao Greenauer -[facility] [place], Population Health [phone] [address] [place] , [place] [address] Email : [email or web address] [email or web address] Shatz DOB: 1940-07-15 MRN: [id number]   RIDER WAIVER AND RELEASE OF LIABILITY  For purposes of improving physical access to our facilities, [facility] is pleased to partner with third parties to provide [facility] patients or other authorized individuals the option of convenient, on-demand ground transportation services (the [employer]") through use of the technology service that enables users to request on-demand ground transportation from independent third-party providers.  By opting to use and accept these [employer], I, the undersigned, hereby agree on behalf of myself, and on behalf of any minor child using the Science writer for whom I am the parent or legal guardian, as follows:  Science writer provided to me are provided by independent third-party transportation providers who are not [employer] or employees and who are unaffiliated with [employer]. [facility] is neither a transportation carrier nor a common or public carrier. [facility] has no control over the quality or safety of the transportation that occurs as a result of the [employer]. [facility] cannot guarantee that any third-party transportation  provider will complete any arranged transportation service. [facility] makes no representation, warranty, or guarantee regarding the reliability, timeliness, quality, safety, suitability, or availability of any of the Transport Services or that they will be error free. I fully understand that traveling by vehicle involves risks and dangers of serious bodily injury, including permanent disability, paralysis, and death. I agree, on behalf of myself and on behalf of any minor child using the Transport Services for whom I am the parent or legal guardian, that the entire risk arising out of my use of the [employer] remains solely with me, to the maximum extent permitted under applicable law. The [employer] are provided "as is" and "as available." [facility] disclaims all representations and warranties, express, implied or statutory, not expressly set out in these terms, including the implied warranties of merchantability and fitness for a particular purpose. I hereby waive and release [place], its agents, employees, officers, directors, representatives, insurers, attorneys, assigns, successors, subsidiaries, and affiliates from any and all past, present, or future claims, demands, liabilities, actions, causes of action, or suits of any kind directly or indirectly arising from acceptance and use of the [employer]. I further waive and release  and its affiliates from all present and future liability and responsibility for any injury or death to persons or damages to property caused by or related to the use of the [employer]. I have read this Waiver and Release of Liability, and I understand the terms used in it and their legal significance. This Waiver is freely and voluntarily given with the understanding that my right (as well as the right of any minor child for whom I am the parent or legal guardian using the [employer]) to  legal recourse against [facility]  in connection with the Transport Services is knowingly surrendered in return for use of these services.   I attest that I read the consent document to [name], gave Ms. Scarpati the opportunity to ask questions and answered the questions asked (if any). I affirm that [name] then provided consent for she's participation in this program.     [name]

## 2022-12-28 ENCOUNTER — Encounter: Payer: Self-pay | Admitting: Podiatry

## 2022-12-28 NOTE — Progress Notes (Signed)
  Subjective:  Patient ID: Alexis Farmer, female    DOB: 1941-05-30,  MRN: [id number]  Alexis Farmer presents to clinic today for at risk footcare. Patient has h/o diabetes, neuropathy and PAD and is seen for  and thick, elongated toenails b/l lower extremities which are tender when wearing enclosed shoe gear.  Chief Complaint  Patient presents with   Nail Problem    DFC   New problem(s): None.   PCP is [name], MD.  Allergies  Allergen Reactions   Celexa [Citalopram]     Diarrhea upset stomach    Jardiance [Empagliflozin] Other (See Comments)    Reaction not recalled   Norvasc [Amlodipine]     Leg edema   Tape Other (See Comments)    Leaves the skin "raw" if left on for a period of time- tolerates paper tape   Penicillin V Rash   Penicillin V Potassium Rash   Penicillins Rash    Has patient had a PCN reaction causing immediate rash, facial/tongue/throat swelling, SOB or lightheadedness with hypotension: Yes Has patient had a PCN reaction causing severe rash involving mucus membranes or skin necrosis: No Has patient had a PCN reaction that required hospitalization No Has patient had a PCN reaction occurring within the last 10 years: Yes If all of the above answers are "NO", then may proceed with Cephalosporin use.     Review of Systems: Negative except as noted in the HPI.  Objective: No changes noted in today's physical examination. Vitals:   12/25/22 1039  BP: (!) 203/72   Alexis Farmer is a pleasant 82 y.o. female morbidly obese in NAD. AAO x 3.  Neurovascular Examination: CFT <3 seconds b/l LE. Nonpalpable DP pulse(s) b/l LE. Nonpalpable PT pulse(s) b/l LE. Pedal hair absent. No pain with calf compression b/l. Nonpitting edema noted BLE. Evidence of chronic venous insufficiency b/l LE. No cyanosis or clubbing noted b/l LE.  Protective sensation decreased with 10 gram monofilament b/l.  Dermatological:  Pedal integument with normal turgor, texture and  tone b/l LE. No open wounds b/l. No interdigital macerations b/l. Toenails 1-4 bilaterally and right 5th toe elongated, thickened, discolored with subungual debris. +Tenderness with dorsal palpation of nailplates. Anonychia noted L 5th toe. Nailbed(s) epithelialized.   Musculoskeletal:  Muscle strength 5/5 to all lower extremity muscle groups bilaterally. Pes planus deformity noted bilateral LE. Utilizes walker for ambulation assistance. Assessment/Plan: 1. Pain due to onychomycosis of toenails of both feet   2. PAD (peripheral artery disease) (HCC)   3. Diabetic peripheral neuropathy associated with type 2 diabetes mellitus (HCC)     -Consent given for treatment as described below: -Examined patient. -Discussed blood pressure reading with patient. Patient is asymptomatic on today's visit. Patient/POA/Caregiver/Family advised to discuss with PCP/Cardiologist. Patient/POA/Family/Caregiver related understanding. -Patient to continue soft, supportive shoe gear daily. -Toenails 1-5 b/l were debrided in length and girth with sterile nail nippers and dremel without iatrogenic bleeding.  -Patient/POA to call should there be question/concern in the interim.   Return in about 3 months (around 03/27/2023).  Freddie Breech, DPM

## 2023-01-01 ENCOUNTER — Encounter (HOSPITAL_COMMUNITY): Payer: Medicare Other

## 2023-01-01 ENCOUNTER — Ambulatory Visit: Payer: Medicare Other | Admitting: Vascular Surgery

## 2023-01-02 LAB — HM MAMMOGRAPHY

## 2023-01-03 ENCOUNTER — Encounter: Payer: Self-pay | Admitting: Family Medicine

## 2023-01-08 ENCOUNTER — Ambulatory Visit: Payer: Medicare Other | Admitting: Family Medicine

## 2023-01-08 VITALS — BP 170/78 | HR 70 | Temp 98.8°F | Ht 63.0 in | Wt 251.0 lb

## 2023-01-08 DIAGNOSIS — I10 Essential (primary) hypertension: Secondary | ICD-10-CM

## 2023-01-08 DIAGNOSIS — F410 Panic disorder [episodic paroxysmal anxiety] without agoraphobia: Secondary | ICD-10-CM

## 2023-01-08 DIAGNOSIS — F39 Unspecified mood [affective] disorder: Secondary | ICD-10-CM | POA: Diagnosis not present

## 2023-01-08 DIAGNOSIS — E1165 Type 2 diabetes mellitus with hyperglycemia: Secondary | ICD-10-CM

## 2023-01-08 DIAGNOSIS — F419 Anxiety disorder, unspecified: Secondary | ICD-10-CM

## 2023-01-08 DIAGNOSIS — E1159 Type 2 diabetes mellitus with other circulatory complications: Secondary | ICD-10-CM

## 2023-01-08 DIAGNOSIS — I152 Hypertension secondary to endocrine disorders: Secondary | ICD-10-CM | POA: Diagnosis not present

## 2023-01-08 LAB — CBC
HCT: 37.6 % (ref 36.0–46.0)
Hemoglobin: 12.1 g/dL (ref 12.0–15.0)
MCHC: 32.2 g/dL (ref 30.0–36.0)
MCV: 90.3 fl (ref 78.0–100.0)
Platelets: 293 10*3/uL (ref 150.0–400.0)
RBC: 4.17 Mil/uL (ref 3.87–5.11)
RDW: 13.8 % (ref 11.5–15.5)
WBC: 7.4 10*3/uL (ref 4.0–10.5)

## 2023-01-08 LAB — BASIC METABOLIC PANEL
BUN: 32 mg/dL — ABNORMAL HIGH (ref 6–23)
CO2: 28 mEq/L (ref 19–32)
Calcium: 10.8 mg/dL — ABNORMAL HIGH (ref 8.4–10.5)
Chloride: 93 mEq/L — ABNORMAL LOW (ref 96–112)
Creatinine, Ser: 1.53 mg/dL — ABNORMAL HIGH (ref 0.40–1.20)
GFR: 31.17 mL/min — ABNORMAL LOW (ref >60.00)
Glucose, Bld: 306 mg/dL — ABNORMAL HIGH (ref 70–99)
Potassium: 5 mEq/L (ref 3.5–5.1)
Sodium: 130 mEq/L — ABNORMAL LOW (ref 135–145)

## 2023-01-08 MED ORDER — LORAZEPAM 1 MG PO TABS
0.5000 mg | ORAL_TABLET | Freq: Every day | ORAL | 0 refills | Status: DC | PRN
Start: 2023-01-08 — End: 2023-06-17

## 2023-01-08 NOTE — Patient Instructions (Addendum)
It was a pleasure meeting you today. Thank you for allowing me to take part in your health care.  Our goals for today as we discussed include:  Your blood pressure is high Continue current medications and follow up with Cardiology  Your blood sugar is high Follow up with Endocrinology  If you have any worsening symptoms recommend calling 911 or have someone take you to the ED  Follow up as needed  If you have any questions or concerns, please do not hesitate to call the office at [phone].  I look forward to our next visit and until then take care and stay safe.  Regards,   [name], MD   [facility]

## 2023-01-08 NOTE — Progress Notes (Signed)
SUBJECTIVE:   No chief complaint on file.  HPI Patient presents to clinic for chronic disease management follow up  Patient presents to clinic and reports to nursing staff feeling of weakness and not feeling well  Vital signs stable  CBG requested EKG ordered but patient declined.  Patient reports now feeling slightly better.  Reports she was feeling dizzy after having walked a longer distance to get to her transportation to the clinic.  Denies any chest pain, shortness of breath, heart palpations, slurred speech, headaches, facial drooping, numbness/tingling or upper/lower extremity weakness.     Follows with Endocrinology for DM. Recent A1c 9.5 Not interested in GLP1 Takes NPH 25u am/15u pm Regular insulin ss with meals.  0-8 units  HTN/HLD BP remains elevated at home 140-150/65-70.  Follows with Cardiology and Nephrology.  Reports taking Catapress 0.2 mg TID, Hydralazine 100 mg QID, Bystolic 10 mg daily, Aldactone 25 mg daily, Torsemide 10 mg daily and Valsartan 160 mg daily. Not taking prescribed statin  Mood Disorder Prescribed Wellbutrin during hospital admission but had not strted medication and does not want to initiate.  Requesting refill of Lorazepam..  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  Constitutional:      General: She is not in acute distress.    Appearance: Normal appearance. She is obese. She is not ill-appearing, toxic-appearing or diaphoretic.  Eyes:     General:        Right eye: No discharge.        Left eye: No discharge.     Conjunctiva/sclera: Conjunctivae normal.  Neck:     Thyroid: No thyromegaly or  thyroid tenderness.  Cardiovascular:     Rate and Rhythm: Normal rate and regular rhythm.     Heart sounds: Normal heart sounds.  Pulmonary:     Effort: Pulmonary effort is normal.     Breath sounds: Normal breath sounds. No wheezing.  Abdominal:     General: Bowel sounds are normal.  Musculoskeletal:        General: Normal range of motion.     Right lower leg: No edema.     Left lower leg: No edema.  Skin:    General: Skin is warm and dry.  Neurological:     General: No focal deficit present.     Mental Status: She is alert and oriented to person, place, and time. Mental status is at baseline.  Psychiatric:        Attention and Perception: Attention normal.        Mood and Affect: Affect is flat.        Speech: Speech normal.        Thought Content: Thought content normal.        Judgment: Judgment normal.       01/08/2023   10:13 AM 11/29/2022   10:37 AM 09/04/2022   11:27 AM 05/14/2022   10:35 AM 03/30/2022   12:08 PM  Depression screen PHQ 2/9  Decreased Interest 0 0 0 0 1  Down, Depressed, Hopeless 0 0 0 1 0  PHQ - 2 Score 0  0 0 1 1      ASSESSMENT/PLAN:  Hypertension associated with diabetes (HCC) Assessment & Plan: Chronic Continues to have elevated BP Currently on 6 antihypertensive medications Follows with Nephrology and Cardiology Referral sent to pharmacy for medication review   Orders: -     Basic metabolic panel -     CBC -     EKG 12-Lead  Panic attack -     LORazepam; Take 0.5 tablets (0.5 mg total) by mouth daily as needed for anxiety. Inform pt 0.5 dose on backorder has to cut this in 1/2 dose  Dispense: 15 tablet; Refill: 0  Mood disorder (HCC) Assessment & Plan: Chronic Denies SI/HI Not interested in starting Wellbutrin Refill Lorazepam 0.5 mg daily prn.  Long term use Encouraged CBT  Orders: -     LORazepam; Take 0.5 tablets (0.5 mg total) by mouth daily as needed for anxiety. Inform pt 0.5 dose on backorder has to cut this in 1/2  dose  Dispense: 15 tablet; Refill: 0  Uncontrolled type 2 diabetes mellitus with hyperglycemia (HCC) Assessment & Plan: Chronic Recent A1c 9.5 Not interested in changing current medication for better control of DM Poor nutrition intake Offered nutritional referral, declined Suspect uncontrolled glucose contributing to increased weakness and unwellness Will check Cmet and CBC today     PDMP reviewed  Return if symptoms worsen or fail to improve, for PCP.    [name], MD

## 2023-01-10 ENCOUNTER — Telehealth: Payer: Self-pay | Admitting: *Deleted

## 2023-01-10 NOTE — Telephone Encounter (Signed)
   Alexis Farmer DOB: Dec 14, 1940 MRN: [id number]   RIDER WAIVER AND RELEASE OF LIABILITY  For purposes of improving physical access to our facilities, Alexis Farmer is pleased to partner with third parties to provide [facility] patients or other authorized individuals the option of convenient, on-demand ground transportation services (the [employer]") through use of the technology service that enables users to request on-demand ground transportation from independent third-party providers.  By opting to use and accept these [employer], I, the undersigned, hereby agree on behalf of myself, and on behalf of any minor child using the Science writer for whom I am the parent or legal guardian, as follows:  Science writer provided to me are provided by independent third-party transportation providers who are not [employer] or employees and who are unaffiliated with [employer]. [facility] is neither a transportation carrier nor a common or public carrier. [facility] has no control over the quality or safety of the transportation that occurs as a result of the [employer]. [facility] cannot guarantee that any third-party transportation provider will complete any arranged transportation service. [facility] makes no representation, warranty, or guarantee regarding the reliability, timeliness, quality, safety, suitability, or availability of any of the Transport Services or that they will be error free. I fully understand that traveling by vehicle involves risks and dangers of serious bodily injury, including permanent disability, paralysis, and death. I agree, on behalf of myself and on behalf of any minor child using the Transport Services for whom I am the parent or legal guardian, that the entire risk arising out of my use of the [employer] remains solely with me, to the maximum extent permitted under applicable law. The [employer] are provided "as  is" and "as available." [facility] disclaims all representations and warranties, express, implied or statutory, not expressly set out in these terms, including the implied warranties of merchantability and fitness for a particular purpose. I hereby waive and release [facility], its agents, employees, officers, directors, representatives, insurers, attorneys, assigns, successors, subsidiaries, and affiliates from any and all past, present, or future claims, demands, liabilities, actions, causes of action, or suits of any kind directly or indirectly arising from acceptance and use of the [employer]. I further waive and release  and its affiliates from all present and future liability and responsibility for any injury or death to persons or damages to property caused by or related to the use of the [employer]. I have read this Waiver and Release of Liability, and I understand the terms used in it and their legal significance. This Waiver is freely and voluntarily given with the understanding that my right (as well as the right of any minor child for whom I am the parent or legal guardian using the [employer]) to legal recourse against  in connection with the [employer] is knowingly surrendered in return for use of these services.   I attest that I read the consent document to Alexis Farmer, gave Alexis Farmer the opportunity to ask questions and answered the questions asked (if any). I affirm that Alexis Farmer then provided consent for she's participation in this program.     Alexis Farmer

## 2023-01-11 ENCOUNTER — Ambulatory Visit: Payer: Self-pay

## 2023-01-11 NOTE — Patient Outreach (Signed)
  Care Coordination   Follow Up Visit Note   01/11/2023 Name: Alexis Farmer MRN: [id number] DOB: 02-20-1941  Alexis Farmer is a 82 y.o. year old female who sees [name], MD for primary care. I spoke with  [name] by phone today.  What matters to the patients health and wellness today?  Patient states she is not doing very good. She states she wants to change her primary care provider because she doesn't feel she is helping her. Patient states her granddaughter is attempting to help her with finding another provider.  Patient states her blood pressure seems to only get elevated when she goes to the doctor. She states she contributes this to having to deal with transportation then walking into the buildings. Patient reports today's fasting blood sugar 160.  Patient asked about BP readings.  BP readings not provided.  Patient reports she is taking her medications as prescribed.  Denied dizziness at time of call.    Goals Addressed             This Visit's Progress    Patient Stated:  " Getting my diabetes under control and manage heart failure"       Interventions Today    Flowsheet Row Most Recent Value  Chronic Disease   Chronic disease during today's visit Diabetes, Hypertension (HTN), Congestive Heart Failure (CHF), Other  [dizziness]  General Interventions   General Interventions Discussed/Reviewed General Interventions Reviewed, Doctor Visits  [evaluation of current treatment plan for DM, HF, HTN and patients adherence to plan as established by provider. Assesed for BP readings, HF symptoms. dizziness.]  Doctor Visits Discussed/Reviewed Doctor Visits Reviewed  Annabell Sabal upcoming provider appointments.  Advised patient to call her insurance provider for in plan PCP listing.]  Education Interventions   Education Provided Provided Education  [Encouraged to monitor blood pressures daily and record. Advised to report blood pressure readings outside of established  parameters to provider.]  Provided Verbal Education On Blood Sugar Monitoring  [Advised to continue to monitor blood sugars daily and record.]  Mental Health Interventions   Mental Health Discussed/Reviewed Anxiety  [discussed how anxiety can affect BP.]  Pharmacy Interventions   Pharmacy Dicussed/Reviewed Pharmacy Topics Reviewed  [medications reviewed and compliance discussed. Reviewed with patient diabetic medication and adjustment and confirmed patient understands and is taking new insulin sliding scale provided by endocrinologist.]                 SDOH assessments and interventions completed:  No     Care Coordination Interventions:  Yes, provided   Follow up plan: Follow up call scheduled for 02/11/23    Encounter Outcome:  Pt. Visit Completed   [name] RN,BSN,CCM [facility] Care Coordination [phone] direct line

## 2023-01-17 ENCOUNTER — Other Ambulatory Visit: Payer: Self-pay | Admitting: *Deleted

## 2023-01-17 DIAGNOSIS — I739 Peripheral vascular disease, unspecified: Secondary | ICD-10-CM

## 2023-01-17 DIAGNOSIS — I6523 Occlusion and stenosis of bilateral carotid arteries: Secondary | ICD-10-CM

## 2023-01-18 ENCOUNTER — Other Ambulatory Visit: Payer: Self-pay | Admitting: Cardiovascular Disease

## 2023-01-20 ENCOUNTER — Encounter: Payer: Self-pay | Admitting: Family Medicine

## 2023-01-20 DIAGNOSIS — F39 Unspecified mood [affective] disorder: Secondary | ICD-10-CM | POA: Insufficient documentation

## 2023-01-20 NOTE — Assessment & Plan Note (Signed)
Chronic Continues to have elevated BP Currently on 6 antihypertensive medications Follows with Nephrology and Cardiology Referral sent to pharmacy for medication review

## 2023-01-20 NOTE — Assessment & Plan Note (Signed)
Chronic Recent A1c 9.5 Not interested in changing current medication for better control of DM Poor nutrition intake Offered nutritional referral, declined Suspect uncontrolled glucose contributing to increased weakness and unwellness Will check Cmet and CBC today

## 2023-01-20 NOTE — Assessment & Plan Note (Signed)
Chronic Denies SI/HI Not interested in starting Wellbutrin Refill Lorazepam 0.5 mg daily prn.  Long term use Encouraged CBT

## 2023-02-04 ENCOUNTER — Telehealth: Payer: Self-pay | Admitting: Family Medicine

## 2023-02-04 ENCOUNTER — Other Ambulatory Visit: Payer: Self-pay

## 2023-02-04 DIAGNOSIS — L304 Erythema intertrigo: Secondary | ICD-10-CM

## 2023-02-04 MED ORDER — NYSTATIN 100000 UNIT/GM EX POWD: Freq: Two times a day (BID) | CUTANEOUS | 11 refills | Status: DC

## 2023-02-04 NOTE — Telephone Encounter (Signed)
Patient called and needs a refill on nystatin (MYCOSTATIN/NYSTOP) powder. She states she is out. Her pharmacy she uses is [facility] [address], [place] - [address] [place] [address] Phone: [phone]  Fax: [phone]  Her number is [phone].

## 2023-02-11 ENCOUNTER — Ambulatory Visit: Payer: Self-pay

## 2023-02-11 ENCOUNTER — Telehealth: Payer: Self-pay

## 2023-02-11 NOTE — Patient Outreach (Unsigned)
  Care Coordination   Follow Up Visit Note   02/11/2023 Name: Alexis Farmer MRN: [id number] DOB: [birth year]  Alexis Farmer is a 82 y.o. year old female who sees [name], MD for primary care. I spoke with  [name] by phone today.  What matters to the patients health and wellness today?  Patient blood sugar readings range from 80 to high 200's.  She reports BP readings have ranged 160-170's/ 60-70's.  Reminded patient to take BP monitor to next doctor appointment for calibration.   Patient denies increase in heart failure symptoms. Patient reports ongoing LE swelling.   She reports next follow up visit with cardiovascular provider is 02/12/23.    Goals Addressed             This Visit's Progress    Patient Stated:  " Getting my diabetes under control and manage heart failure"       Interventions Today    Flowsheet Row Most Recent Value  Chronic Disease   Chronic disease during today's visit Diabetes, Hypertension (HTN), Congestive Heart Failure (CHF)  General Interventions   General Interventions Discussed/Reviewed General Interventions Reviewed, Doctor Visits  [evaluation of current treatment plan for DM, HTN, Heart failure and patients adherence to plan as established by provider.   Assessed for Blood sugar readings, heart failure symptoms and BP readings.]  Doctor Visits Discussed/Reviewed Doctor Visits Reviewed  Annabell Sabal upcoming provider appointments.]  Education Interventions   Education Provided Provided Education  [reviewed heart failure signs/ symptoms, Discussed importance of following low carb diet.]  Nutrition Interventions   Nutrition Discussed/Reviewed Carbohydrate meal planning, Decreasing sugar intake, Decreasing salt  Pharmacy Interventions   Pharmacy Dicussed/Reviewed Pharmacy Topics Reviewed  [medication reviewed and compliance discussed.]                 SDOH assessments and interventions completed:  No     Care Coordination  Interventions:  Yes, provided   Follow up plan: Follow up call scheduled for 03/14/23    Encounter Outcome:  Pt. Visit Completed   [name] RN,BSN,CCM [facility] Care Coordination [phone] direct line

## 2023-02-11 NOTE — Patient Outreach (Signed)
  Care Coordination   02/11/2023 Name: Alexis Farmer MRN: [id number] DOB: 14-Nov-1940   Care Coordination Outreach Attempts:  An unsuccessful telephone outreach was attempted for a scheduled appointment today. HIPAA compliant message left with call back phone number and return call request.   Follow Up Plan:  Additional outreach attempts will be made to offer the patient care coordination information and services.   Encounter Outcome:  No Answer   Care Coordination Interventions:  No, not indicated    [name] [facility] Care Coordination [phone] direct line

## 2023-02-11 NOTE — Progress Notes (Unsigned)
[facility]  ASSESSMENT / PLAN: Alexis Farmer is a 82 y.o. 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  Patient counseled patients with asymptomatic peripheral arterial disease or claudication have a 1-2% risk of developing chronic limb threatening ischemia, but a 15-30% risk of mortality in the next 5 years. Intervention should only be considered for medically optimized patients with disabling symptoms.   Recommend the following which can slow the progression of atherosclerosis and reduce the risk of major adverse cardiac / limb events:  Complete cessation from all tobacco products. Blood glucose control with goal A1c < 7%. Blood pressure control with goal blood pressure < 140/90 mmHg. Lipid reduction therapy with goal LDL-C <100 mg/dL (<16 if symptomatic from PAD).  Aspirin 81mg  PO QD.  Atorvastatin 40-80mg  PO QD (or other "high intensity" statin therapy).  She is not a candidate for any invasive vascular surgical.  Recommend medical therapy only.  She needs help with applying compression garments to her lower extremities to help manage her edema.  I have sent a referral in for home health evaluation.  Follow up with me as needed  CHIEF COMPLAINT: establish care  HISTORY OF PRESENT ILLNESS: Alexis Farmer is a 82 y.o. female previously followed by [facility] for bilateral lower extremity claudication, right upper extremity asymptomatic arterial disease, and carotid artery stenosis.  She is asymptomatic from all of these.  She was counseled to establish care in [place] to make transportation easier for her caretakers.  Reports she can walk without much difficulty.  She has no symptoms typical of ischemic rest pain.  She has no ischemic ulceration.  She denies any symptoms of stroke  or TIA.  He has no difficulty using the right upper extremity.  She denies vertebrobasilar symptoms.  10/10/21: Returns to clinic.  She is highly concerned about her right subclavian artery occlusion.  This is asymptomatic.  She has no rest pain or ischemic ulceration of her right upper extremity and she reports no effort induced dizziness or discomfort in her right upper extremity.  She does not walk fast or far enough to claudicate.  She reports no new neurologic symptoms.  02/12/23: Patient returns to clinic for surveillance of mild atherosclerotic disease globally.  She has mild carotid artery stenosis without new neurologic symptoms of stroke or mini stroke.  She is minimally ambulatory and does not walk fast or far enough to claudicate at this point.  She does report significant lower extremity swelling.  She has not been able to apply compression socks because of her limited mobility.  Past Medical History:  Diagnosis Date   Anxiety 09/13/2015   Arthritis    Atypical chest pain 01/15/2021   [place] 10/24/2021   Bradycardia 07/17/2016   Formatting of this note might be different from the original.  Last Assessment & Plan:   Found to be bradycardic at outside hospital. They stopped metoprolol and verapamil and heart rate has been normal since then. Echo appears to have been reassuring at the outside facility. Appears to be doing much better with regards to this. She'll continue to monitor for recurrent symptoms. 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  Last Assessment & Plan:   Continues to have issues with this.  She is following with a specialist and it  sounds as though they are planning on an MRI.  Benign exam today.   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 Loletha Carrow of this note might be different from the original.  Dr. Thedore Mins      Last Assessment &  Plan:   Recheck kidney function today.   Constipation 11/17/2019   Cough 01/25/2016   Formatting of this note might be different from the original.  Last Assessment & Plan:   Minimal nighttime cough. Improves when she washes her pillows consistently. Suspect allergies contributing. She'll monitor.   Diabetes mellitus without complication (HCC)    one elevated reading/ no treatment   Disorder of rotator cuff 06/24/2018   Diverticulitis    Dysuria 03/22/2017   Formatting of this note might be different from the original.  Last Assessment & Plan:   Symptoms concerning for UTI.  Will check urinalysis.   Edema of foot 04/08/2014   Formatting of this note might be different from the original.  Last Assessment & Plan:   Chronic pedal edema. Suspect venous insufficiency. No orthopnea or shortness of breath. Advised elevation of her legs. Consider compression stockings in the future.   Elevated troponin 12/15/2020   Essential hypertension 04/08/2014   Formatting of this note might be different from the original.  Last Assessment & Plan:   Well-controlled on recheck.  Continue current regimen.   Fall 03/26/2018   Gastroenteritis 08/12/2021   Gastrointestinal hemorrhage 08/03/2015   GI bleed    High cholesterol    History of blood transfusion    Hyperglycemia due to type 2 diabetes mellitus (HCC) 03/11/2015   Hypertension    Hypertensive urgency 12/15/2020   Low back pain 04/08/2014   Morbid obesity (HCC) 04/08/2014   Formatting of this note might be different from the original.  Last Assessment & Plan:   Weight is stable. Discussed diet and exercise at length. Encouraged whatever exercise she can do. Given diet information.   Palpitations 12/15/2020   Peripheral edema 12/22/2019   Postmenopausal bleeding 07/17/2016   Formatting of this note might be different from the original.  Last Assessment & Plan:   Recent D&C. Following with gynecology. Benign findings. Monitor for recurrence.   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  Last Assessment & Plan:   Continues to have issues with this.  She is following with a specialist and it sounds as though they are planning on an MRI.  Benign exam today.   Recurrent falls 08/13/2019   Renal insufficiency    Skin cyst 10/24/2021   Stage 3a chronic kidney disease (HCC) 01/15/2021   Swelling of lower leg 07/19/2016   Vertigo 10/13/2015   Formatting of this note might be different from the original.  Last Assessment & Plan:   No recurrence. Discussed that meclizine is an as needed medication and that she does not need to take this daily. She will continue to monitor.    Past Surgical History:  Procedure Laterality Date   APPENDECTOMY     CHOLECYSTECTOMY     ECTOPIC PREGNANCY SURGERY     EYE SURGERY     bilateral cataracts   EYE SURGERY     02/11/2019 repair hole in right eye    gallbladder      HYSTEROSCOPY WITH D & C N/A  10/26/2016   Procedure: DILATATION AND CURETTAGE /HYSTEROSCOPY;  Surgeon: Christeen Douglas, MD;  Location: [facility] ORS;  Service: Gynecology;  Laterality: N/A;   HYSTEROSCOPY WITH D & C N/A 07/07/2018   Procedure: DILATATION AND CURETTAGE /HYSTEROSCOPY;  Surgeon: Christeen Douglas, MD;  Location: [facility] ORS;  Service: Gynecology;  Laterality: N/A;   THYROIDECTOMY, PARTIAL      Family History  Problem Relation Age of Onset   Diabetes Mother    Hypertension Mother    Stroke Mother    Diabetes Other    Healthy Father    Diabetes Sister    Heart disease Sister     Social History   Socioeconomic History   Marital status: Widowed    Spouse name: Not on file   Number of children: 1   Years of education: 27   Highest education level: 12th grade  Occupational History   Occupation: retired    Comment: hx of Retail banker, Advertising copywriter, 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  Tobacco Use   Smoking status: Never   Smokeless tobacco: Never  Vaping Use   Vaping status: Never Used  Substance and Sexual Activity   Alcohol use: No   Drug use: No   Sexual activity: Not Currently  Other Topics Concern   Not on file  Social History Narrative   Lives alone    From [place]   Widowed - was married 3 times starting at age 15    hx of seamtress, 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   Social Determinants of Health   Financial Resource Strain: Low Risk  (11/29/2022)   Overall Financial Resource Strain (CARDIA)    Difficulty of Paying Living Expenses: Not hard at all  Food Insecurity: No Food Insecurity (11/29/2022)   Hunger Vital Sign    Worried About Running Out of Food in the Last Year: Never true    Ran Out of Food in the Last Year: Never true  Transportation Needs: Unmet Transportation Needs (11/29/2022)   PRAPARE - Administrator, Civil Service (Medical): Yes    Lack of Transportation (Non-Medical): No  Physical Activity: Inactive (11/29/2022)   Exercise Vital Sign    Days of Exercise per Week: 0 days    Minutes of Exercise per Session: 0 min  Stress: Stress Concern Present (11/29/2022)   [employer] of Occupational Health - Occupational Stress Questionnaire    Feeling of Stress : To some extent  Social Connections: Socially Integrated (11/29/2022)   Social Connection and Isolation Panel [NHANES]    Frequency of Communication with Friends and Family: More than three times a week    Frequency of Social Gatherings with Friends and Family: More than three times a week    Attends Religious Services: More than 4 times per year    Active Member of [employer] or Organizations: Yes    Attends Engineer, structural: More than 4 times per year    Marital Status: Married  Catering manager Violence: Not At Risk (11/29/2022)   Humiliation, Afraid, Rape, and Kick questionnaire    Fear of  Current or Ex-Partner: No    Emotionally Abused: No    Physically Abused: No    Sexually Abused: No    Allergies  Allergen Reactions   Celexa [Citalopram]     Diarrhea upset stomach    Jardiance [Empagliflozin] Other (See Comments)    Reaction not recalled   Norvasc [Amlodipine]     Leg  edema   Tape Other (See Comments)    Leaves the skin "raw" if left on for a period of time- tolerates paper tape   Penicillin V Rash   Penicillin V Potassium Rash   Penicillins Rash    Has patient had a PCN reaction causing immediate rash, facial/tongue/throat swelling, SOB or lightheadedness with hypotension: Yes Has patient had a PCN reaction causing severe rash involving mucus membranes or skin necrosis: No Has patient had a PCN reaction that required hospitalization No Has patient had a PCN reaction occurring within the last 10 years: Yes If all of the above answers are "NO", then may proceed with Cephalosporin use.     Current Outpatient Medications  Medication Sig Dispense Refill   acetaminophen (TYLENOL) 500 MG tablet Take 500 mg by mouth every 6 (six) hours as needed for mild pain or headache.     Ascorbic Acid (VITAMIN C PO) Take 1 tablet by mouth daily.     aspirin EC 81 MG tablet Take 81 mg by mouth 2 (two) times a week. Swallow whole.     blood glucose meter kit and supplies KIT Accu chek, Dx code E11.65, check 3 times daily 1 each 0   Blood Pressure KIT 1 Device by Does not apply route daily. 1 kit 0   cloNIDine (CATAPRES) 0.2 MG tablet Take 1 tablet (0.2 mg total) by mouth 2 (two) times daily. (Patient taking differently: Take 0.2 mg by mouth 3 (three) times daily.) 270 tablet 3   glucose blood (ACCU-CHEK GUIDE) test strip USE AS INSTRUCTED 3 TIMES DAILY 300 strip 12   hydrALAZINE (APRESOLINE) 100 MG tablet TAKE 1 TABLET (100 MG TOTAL) BY MOUTH 4 (FOUR) TIMES DAILY. 360 tablet 2   insulin NPH Human (NOVOLIN N) 100 UNIT/ML injection INJECT 20 UNITS IN THE MORNING AND THE EVENING WITH  A MEAL (Patient taking differently: 40 Units 2 (two) times daily before a meal.) 40 mL PRN   insulin regular (NOVOLIN R) 100 units/mL injection Inject 0-0.08 mLs (0-8 Units total) into the skin 3 (three) times daily before meals. 30 mL 11   Insulin Syringe-Needle U-100 (BD INSULIN SYRINGE U/F) 30G X 1/2" 0.5 ML MISC USE UP TO 5X PER DAY WITH INSULIN 2X (NPH) AND 3X (REGULAR) 500 each 12   LORazepam (ATIVAN) 1 MG tablet Take 0.5 tablets (0.5 mg total) by mouth daily as needed for anxiety. Inform pt 0.5 dose on backorder has to cut this in 1/2 dose 15 tablet 0   meclizine (ANTIVERT) 25 MG tablet Take 1 tablet (25 mg total) by mouth 3 (three) times daily as needed for dizziness. 30 tablet 0   Multiple Vitamins-Minerals (CENTRUM SILVER 50+WOMEN) TABS Take 1 tablet by mouth daily with breakfast.     nebivolol (BYSTOLIC) 10 MG tablet Take 1 tablet (10 mg total) by mouth daily. 90 tablet 2   nystatin (MYCOSTATIN/NYSTOP) powder Apply topically 2 (two) times daily. Under abdomen 60 g 11   ondansetron (ZOFRAN-ODT) 4 MG disintegrating tablet Take 1 tablet (4 mg total) by mouth every 8 (eight) hours as needed for nausea or vomiting. 20 tablet 0   polyethylene glycol powder (GLYCOLAX/MIRALAX) 17 GM/SCOOP powder Take 17 g by mouth daily as needed for moderate constipation or severe constipation. Can take up to 2x per day prn. Mix with 8 ounces of liquid 3350 g 11   rosuvastatin (CRESTOR) 20 MG tablet TAKE 1 TABLET BY MOUTH EVERY DAY (Patient not taking: Reported on 01/08/2023) 90 tablet 3  torsemide (DEMADEX) 10 MG tablet Take 1 tablet (10 mg total) by mouth daily. 90 tablet 3   valsartan (DIOVAN) 160 MG tablet TAKE 1 TABLET BY MOUTH EVERY DAY 90 tablet 0   No current facility-administered medications for this visit.    PHYSICAL EXAM  There were no vitals filed for this visit.  Elderly.  Morbidly obese.  In a wheelchair Regular rate and rhythm Unlabored breathing Brisk Doppler flow in bilateral dorsalis  pedis arteries Significant edema in bilateral lower extremities with mild inflammatory skin changes of the pretibial skin on the left concerning for chronic venous insufficiency  PERTINENT LABORATORY AND RADIOLOGIC DATA  Most recent CBC    Latest Ref Rng & Units 01/08/2023   11:18 AM 11/27/2022    6:27 AM 10/22/2022    5:12 AM  CBC  WBC 4.0 - 10.5 K/uL 7.4  7.9  10.3   Hemoglobin 12.0 - 15.0 g/dL 40.9  81.1  91.4   Hematocrit 36.0 - 46.0 % 37.6  36.6  40.3   Platelets 150.0 - 400.0 K/uL 293.0  231  258      Most recent CMP    Latest Ref Rng & Units 01/08/2023   11:18 AM 11/27/2022    6:27 AM 10/23/2022    4:21 AM  CMP  Glucose 70 - 99 mg/dL 782  956  213   BUN 6 - 23 mg/dL 32  36  37   Creatinine 0.40 - 1.20 mg/dL 0.86  5.78  4.69   Sodium 135 - 145 mEq/L 130  134  133   Potassium 3.5 - 5.1 mEq/L 5.0  5.1  4.7   Chloride 96 - 112 mEq/L 93  99  101   CO2 19 - 32 mEq/L 28  24  24    Calcium 8.4 - 10.5 mg/dL 62.9  9.8  9.5   Total Protein 6.5 - 8.1 g/dL  6.8    Total Bilirubin 0.3 - 1.2 mg/dL  0.7    Alkaline Phos 38 - 126 U/L  40    AST 15 - 41 U/L  30    ALT 0 - 44 U/L  23      Renal function CrCl cannot be calculated (Patient's most recent lab result is older than the maximum 21 days allowed.).  Hgb A1c MFr Bld (%)  Date Value  10/22/2022 9.5 (H)    LDL Cholesterol  Date Value Ref Range Status  12/15/2020 40 0 - 99 mg/dL Final    Comment:           Total Cholesterol/HDL:CHD Risk Coronary Heart Disease Risk Table                     Men   Women  1/2 Average Risk   3.4   3.3  Average Risk       5.0   4.4  2 X Average Risk   9.6   7.1  3 X Average Risk  23.4   11.0        Use the calculated Patient Ratio above and the CHD Risk Table to determine the patient's CHD Risk.        ATP III CLASSIFICATION (LDL):  <100     mg/dL   Optimal  528-413  mg/dL   Near or Above                    Optimal  130-159  mg/dL   Borderline  244-010  mg/dL   High  >132     mg/dL    Very High Performed at [facility], [address], [place], [place] [address]    Direct LDL  Date Value Ref Range Status  12/13/2021 46.0 mg/dL Final    Comment:    Optimal:  <100 mg/dLNear or Above Optimal:  100-129 mg/dLBorderline High:  130-159 mg/dLHigh:  160-189 mg/dLVery High:  >190 mg/dL     Vascular Imaging: Right Carotid: Velocities in the right ICA are consistent with a 1-39%  stenosis.   Left Carotid: Velocities in the left ICA are consistent with a 1-39%  stenosis.   Vertebrals:  Left vertebral artery demonstrates antegrade flow. Right  vertebral               artery demonstrates retrograde flow.  Subclavians: Normal flow hemodynamics were seen in the left subclavian  artery.               Right monophasic.   Patient not able to complete ABI.  Reassuring exam with nearly triphasic Doppler flow bilaterally  Rande Brunt. Lenell Antu, MD [facility] Phone Number: [phone] 02/11/2023 10:43 AM

## 2023-02-12 ENCOUNTER — Encounter: Payer: Self-pay | Admitting: Vascular Surgery

## 2023-02-12 ENCOUNTER — Ambulatory Visit (INDEPENDENT_AMBULATORY_CARE_PROVIDER_SITE_OTHER): Payer: Medicare Other | Admitting: Vascular Surgery

## 2023-02-12 ENCOUNTER — Ambulatory Visit (HOSPITAL_COMMUNITY)
Admission: RE | Admit: 2023-02-12 | Discharge: 2023-02-12 | Disposition: A | Discharge status: Home / Self Care | Payer: Medicare Other | Source: Ambulatory Visit | Attending: Vascular Surgery | Admitting: Vascular Surgery

## 2023-02-12 ENCOUNTER — Ambulatory Visit (HOSPITAL_COMMUNITY): Admission: RE | Admit: 2023-02-12 | Payer: Medicare Other | Source: Ambulatory Visit

## 2023-02-12 VITALS — BP 174/71 | HR 68 | Temp 97.7°F | Resp 20 | Ht 63.0 in | Wt 252.0 lb

## 2023-02-12 DIAGNOSIS — I6523 Occlusion and stenosis of bilateral carotid arteries: Secondary | ICD-10-CM | POA: Insufficient documentation

## 2023-02-12 DIAGNOSIS — I739 Peripheral vascular disease, unspecified: Secondary | ICD-10-CM

## 2023-02-12 DIAGNOSIS — I708 Atherosclerosis of other arteries: Secondary | ICD-10-CM

## 2023-02-12 DIAGNOSIS — I872 Venous insufficiency (chronic) (peripheral): Secondary | ICD-10-CM | POA: Diagnosis not present

## 2023-02-13 ENCOUNTER — Telehealth: Payer: Self-pay

## 2023-02-13 LAB — VAS US ABI WITH/WO TBI

## 2023-02-13 NOTE — Patient Outreach (Signed)
  Care Coordination   Follow Up Visit Note   02/13/2023 Name: Alexis Farmer MRN: [id number] DOB: 03-Oct-1940  Alexis Farmer is a 82 y.o. year old female who sees Alexis Allan, MD for primary care. I spoke with  Alexis Farmer by phone today.  What matters to the patients health and wellness today?  Patient reports having visit with vascular surgeon on yesterday. She states she was advised to wear compression hose. Per chart review of vascular surgeon visit on 02/12/23 with patient will need assistance with applying compression hose and ongoing management of diabetes, cholesterol,  and BP.    Goals Addressed             This Visit's Progress    Patient Stated:  " Getting my diabetes under control and manage heart failure and chronic health conditions"       Interventions Today    Flowsheet Row Most Recent Value  Chronic Disease   Chronic disease during today's visit Other  [mild carotid artery stenosis/ venous insufficiency]  General Interventions   General Interventions Discussed/Reviewed General Interventions Reviewed, Doctor Visits  [evaluation of current treatment plan and patients adherence to plan as established by provider.]  Doctor Visits Discussed/Reviewed --  Annabell Sabal upcoming provider visits.]  Education Interventions   Education Provided Provided Printed Education, Provided Education  [Advised importance of wearing compression stockings. education article mailed to patient regarding heart healthy diet.]  Nutrition Interventions   Nutrition Discussed/Reviewed Nutrition Reviewed  [Discussed importance of lifestyle change, exercise, heart healthy diet to manage health conditions]  Pharmacy Interventions   Pharmacy Dicussed/Reviewed --  [Inquired if patient has had any medication adjustments. Advised to continue to take medication as prescribed.]                 SDOH assessments and interventions completed:  No     Care Coordination Interventions:  Yes,  provided   Follow up plan: Follow up call scheduled for as previously scheduled    Encounter Outcome:  Pt. Visit Completed   [name] RN,BSN,CCM [facility] Care Coordination [phone] direct line

## 2023-02-13 NOTE — Patient Instructions (Signed)
Visit Information  Thank you for taking time to visit with me today. Please don't hesitate to contact me if I can be of assistance to you.   Following are the goals we discussed today:   Goals Addressed             This Visit's Progress    Patient Stated:  " Getting my diabetes under control and manage heart failure and chronic health conditions"       Interventions Today    Flowsheet Row Most Recent Value  Chronic Disease   Chronic disease during today's visit Other  [mild carotid artery stenosis/ venous insufficiency]  General Interventions   General Interventions Discussed/Reviewed General Interventions Reviewed, Doctor Visits  [evaluation of current treatment plan and patients adherence to plan as established by provider.]  Doctor Visits Discussed/Reviewed --  Annabell Sabal upcoming provider visits.]  Education Interventions   Education Provided Provided Printed Education, Provided Education  [Advised importance of wearing compression stockings. education article mailed to patient regarding heart healthy diet.]  Nutrition Interventions   Nutrition Discussed/Reviewed Nutrition Reviewed  [Discussed importance of lifestyle change, exercise, heart healthy diet to manage health conditions]  Pharmacy Interventions   Pharmacy Dicussed/Reviewed --  [Inquired if patient has had any medication adjustments. Advised to continue to take medication as prescribed.]                 Our next appointment is by telephone on 03/14/23 at 10:30 am  Please call the care guide team at [phone] if you need to cancel or reschedule your appointment.   If you are experiencing a Mental Health or Behavioral Health Crisis or need someone to talk to, please call the Suicide and Crisis Lifeline: 988 call 1-800-273-TALK (toll free, 24 hour hotline)  The patient verbalized understanding of instructions, educational materials, and care plan provided today and agreed to receive a mailed copy of patient  instructions, educational materials, and care plan.   [name] RN,BSN,CCM [facility] Care Coordination [phone] direct line

## 2023-02-13 NOTE — Telephone Encounter (Signed)
Pt called stating that she had some questions that she forgot to ask at her appt yesterday.  Reviewed pt's chart, returned call for clarification, no answer, lf vm.

## 2023-02-13 NOTE — Telephone Encounter (Signed)
Returned pt's call regarding a few questions after her visit yesterday. She was concerned about her transportation and was not paying attention well, she stated. Pt's questions answered-no concerns at this time.

## 2023-02-27 ENCOUNTER — Telehealth: Payer: Self-pay | Admitting: *Deleted

## 2023-02-27 NOTE — Telephone Encounter (Signed)
   Telephone encounter was:  Successful.  02/27/2023 Name: Alexis Farmer MRN: [id number] DOB: May 11, 1941  Alexis Farmer is a 82 y.o. year old female who is a primary care patient of [name], MD . The community resource team was consulted for assistance with Transportation Needs   Care guide performed the following interventions: Patient provided with information about care guide support team and interviewed to confirm resource needs.  Follow Up Plan:  No further follow up planned at this time. The patient has been provided with needed resources.  [name] [facility]  Population Health Careguide  Direct Dial: [phone] Website: [email or web address]

## 2023-03-05 LAB — HM DIABETES EYE EXAM

## 2023-03-07 ENCOUNTER — Ambulatory Visit: Payer: Medicare Other | Admitting: Family Medicine

## 2023-03-14 ENCOUNTER — Ambulatory Visit: Payer: Self-pay

## 2023-03-14 NOTE — Patient Outreach (Signed)
Care Coordination   Follow Up Visit Note   03/14/2023 Name: Alexis Farmer MRN: [id number] DOB: 1940-07-28  Alexis Farmer is a 82 y.o. year old female who sees [name], MD for primary care. I spoke with  [name] by phone today.  What matters to the patients health and wellness today?  Patient states she is not feeling well. She states she has a cold and would prefer not to talk today.  Patient agreeable to having follow up call with RNCM next week.     Goals Addressed             This Visit's Progress    Patient Stated:  " Getting my diabetes under control and manage heart failure and chronic health conditions"       Interventions Today    Flowsheet Row Most Recent Value  General Interventions   General Interventions Discussed/Reviewed General Interventions Reviewed  [patient advised to contact her provider if symptoms worsen / call 911 for severe symptoms.]                 SDOH assessments and interventions completed:  No     Care Coordination Interventions:  Yes, provided   Follow up plan: Follow up call scheduled for 03/22/23    Encounter Outcome:  Patient Visit Completed   [name] RN,BSN,CCM [facility] Care Coordination [phone] direct line

## 2023-03-15 ENCOUNTER — Encounter (HOSPITAL_COMMUNITY): Payer: Self-pay

## 2023-03-15 ENCOUNTER — Ambulatory Visit (HOSPITAL_COMMUNITY)
Admission: EM | Admit: 2023-03-15 | Discharge: 2023-03-15 | Disposition: A | Discharge status: Home / Self Care | Payer: Medicare Other | Attending: Family Medicine | Admitting: Family Medicine

## 2023-03-15 DIAGNOSIS — B9789 Other viral agents as the cause of diseases classified elsewhere: Secondary | ICD-10-CM | POA: Diagnosis not present

## 2023-03-15 DIAGNOSIS — J069 Acute upper respiratory infection, unspecified: Secondary | ICD-10-CM | POA: Diagnosis not present

## 2023-03-15 DIAGNOSIS — I10 Essential (primary) hypertension: Secondary | ICD-10-CM | POA: Diagnosis not present

## 2023-03-15 DIAGNOSIS — U071 COVID-19: Secondary | ICD-10-CM | POA: Diagnosis not present

## 2023-03-15 NOTE — ED Provider Notes (Signed)
[facility]    CSN: [id number] Arrival date & time: 03/15/23  1532      History   Chief Complaint Chief Complaint  Patient presents with   Cough   Generalized Body Aches    HPI Alexis Farmer is a 82 y.o. female.    Cough Here for cough and congestion that began on September 23.  She has not had any fever and her dyspnea is at baseline.  No nausea or vomiting.  She actually started feeling better in the last 48 hours, but she noted her blood pressure to be high at home and wanted to get checked out.  She just feels more tired anything at present.  She has been taking some over-the-counter medications, but it is unclear if these had decongestant in them or not.  Past Medical History:  Diagnosis Date   Anxiety 09/13/2015   Arthritis    Atypical chest pain 01/15/2021   Blackhead 10/24/2021   Bradycardia 07/17/2016   Formatting of this note might be different from the original.  Last Assessment & Plan:   Found to be bradycardic at outside hospital. They stopped metoprolol and verapamil and heart rate has been normal since then. Echo appears to have been reassuring at the outside facility. Appears to be doing much better with regards to this. She'll continue to monitor for recurrent symptoms. 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  Last Assessment & Plan:   Continues to have issues with this.  She is following with a specialist and it sounds as though they are planning on an MRI.  Benign exam today.   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 Loletha Carrow of this note might be different from the original.  Dr. Thedore Mins      Last Assessment & Plan:   Recheck kidney function today.   Constipation 11/17/2019   Cough 01/25/2016   Formatting of this note might be different from the original.  Last Assessment & Plan:   Minimal nighttime  cough. Improves when she washes her pillows consistently. Suspect allergies contributing. She'll monitor.   Diabetes mellitus without complication (HCC)    one elevated reading/ no treatment   Disorder of rotator cuff 06/24/2018   Diverticulitis    Dysuria 03/22/2017   Formatting of this note might be different from the original.  Last Assessment & Plan:   Symptoms concerning for UTI.  Will check urinalysis.   Edema of foot 04/08/2014   Formatting of this note might be different from the original.  Last Assessment & Plan:   Chronic pedal edema. Suspect venous insufficiency. No orthopnea or shortness of breath. Advised elevation of her legs. Consider compression stockings in the future.   Elevated troponin 12/15/2020   Essential hypertension 04/08/2014   Formatting of this note might be different from the original.  Last Assessment & Plan:   Well-controlled on recheck.  Continue current regimen.   Fall 03/26/2018   Gastroenteritis 08/12/2021   Gastrointestinal hemorrhage 08/03/2015   GI bleed    High cholesterol    History of blood transfusion    Hyperglycemia due to type 2 diabetes mellitus (HCC) 03/11/2015   Hypertension    Hypertensive urgency 12/15/2020   Low back pain 04/08/2014   Morbid obesity (HCC) 04/08/2014   Formatting of this note might be different from the original.  Last Assessment & Plan:   Weight is stable. Discussed diet and exercise at length. Encouraged whatever exercise she can do. Given diet information.   Palpitations 12/15/2020   Peripheral edema 12/22/2019   Postmenopausal bleeding 07/17/2016   Formatting of this note might be different from the original.  Last Assessment & Plan:   Recent D&C. Following with gynecology. Benign findings. Monitor for recurrence.   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  Last  Assessment & Plan:   Continues to have issues with this.  She is following with a specialist and it sounds as though they are planning on an MRI.  Benign exam today.   Recurrent falls 08/13/2019   Renal insufficiency    Skin cyst 10/24/2021   Stage 3a chronic kidney disease (HCC) 01/15/2021   Swelling of lower leg 07/19/2016   Vertigo 10/13/2015   Formatting of this note might be different from the original.  Last Assessment & Plan:   No recurrence. Discussed that meclizine is an as needed medication and that she does not need to take this daily. She will continue to monitor.    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   Stage 3b chronic kidney disease (HCC) 03/21/2022   Ileus (HCC) 08/22/2021   Lymphedema 04/19/2021   Atherosclerosis of aorta (HCC) 01/16/2021   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/16/2021   Spinal stenosis of lumbar region 01/16/2021   Panic attack 01/15/2021   Other chronic pain 08/25/2020   Hypertension associated with diabetes (HCC) 08/25/2020   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) 05/30/2020   Retinopathy 05/30/2020   Multinodular goiter 02/04/2020   Proteinuria 12/22/2019   Physical deconditioning 08/13/2019   Abdominal aortic atherosclerosis (HCC) 07/24/2019   Morbid obesity with BMI of 45.0-49.9, adult (HCC) 06/09/2019   Hypercalcemia 03/18/2019   Round hole, unspecified eye 02/06/2019   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   Other transient cerebral ischemic  attacks and related syndromes 08/13/2018   Abnormal gait 03/26/2018   Arthritis 03/26/2018   Thyroid nodule 01/20/2018   Rectal hemorrhage 07/29/2016   Carotid artery stenosis 07/19/2016   PAD (peripheral artery disease) (HCC) 07/19/2016   Chronic diastolic CHF (congestive heart failure) (HCC) 05/31/2016   Chronic fatigue 11/15/2015   Depression, recurrent (HCC) 10/13/2015   Osteoarthritis 10/13/2015   Mixed hyperlipidemia 08/26/2014   Uncontrolled type 2 diabetes mellitus with hyperglycemia (HCC) 04/08/2014   Stage 2 chronic kidney disease 04/08/2014   Diabetic polyneuropathy (HCC) 04/08/2014   Type 2 diabetes mellitus with other diabetic kidney complication (HCC) 04/08/2014    Past Surgical History:  Procedure Laterality Date   APPENDECTOMY     CHOLECYSTECTOMY     ECTOPIC PREGNANCY SURGERY     EYE SURGERY     bilateral cataracts   EYE SURGERY     02/11/2019 repair hole in right eye    gallbladder      HYSTEROSCOPY WITH D & C N/A 10/26/2016  Procedure: DILATATION AND CURETTAGE /HYSTEROSCOPY;  Surgeon: Christeen Douglas, MD;  Location: [facility];  Service: Gynecology;  Laterality: N/A;   HYSTEROSCOPY WITH D & C N/A 07/07/2018   Procedure: DILATATION AND CURETTAGE /HYSTEROSCOPY;  Surgeon: Christeen Douglas, MD;  Location: [facility];  Service: Gynecology;  Laterality: N/A;   THYROIDECTOMY, PARTIAL      OB History   No obstetric history on file.      Home Medications    Prior to Admission medications   Medication Sig Start Date End Date Taking? Authorizing Provider  acetaminophen (TYLENOL) 500 MG tablet Take 500 mg by mouth every 6 (six) hours as needed for mild pain or headache.    [provider]  Ascorbic Acid (VITAMIN C PO) Take 1 tablet by mouth daily.    [provider]  aspirin EC 81 MG tablet Take 81 mg by mouth 2 (two) times a week. Swallow whole.    [provider]  blood glucose meter kit and supplies KIT Accu chek, Dx code E11.65, check 3  times daily 07/26/17   Glori Luis, MD  Blood Pressure KIT 1 Device by Does not apply route daily. 10/24/21   McLean-Scocuzza, Pasty Spillers, MD  cloNIDine (CATAPRES) 0.2 MG tablet Take 1 tablet (0.2 mg total) by mouth 2 (two) times daily. Patient taking differently: Take 0.2 mg by mouth 3 (three) times daily. 12/18/21   Antonieta Iba, MD  glucose blood (ACCU-CHEK GUIDE) test strip USE AS INSTRUCTED 3 TIMES DAILY 09/06/22   [name], MD  hydrALAZINE (APRESOLINE) 100 MG tablet TAKE 1 TABLET (100 MG TOTAL) BY MOUTH 4 (FOUR) TIMES DAILY. 09/11/22   Gollan, Tollie Pizza, MD  insulin NPH Human (NOVOLIN N) 100 UNIT/ML injection INJECT 20 UNITS IN THE MORNING AND THE EVENING WITH A MEAL Patient taking differently: 40 Units 2 (two) times daily before a meal. 04/10/21   McLean-Scocuzza, Pasty Spillers, MD  insulin regular (NOVOLIN R) 100 units/mL injection Inject 0-0.08 mLs (0-8 Units total) into the skin 3 (three) times daily before meals. 10/17/20   Romero Belling, MD  Insulin Syringe-Needle U-100 (BD INSULIN SYRINGE U/F) 30G X 1/2" 0.5 ML MISC USE UP TO 5X PER DAY WITH INSULIN 2X (NPH) AND 3X (REGULAR) 12/18/22   [name], MD  LORazepam (ATIVAN) 1 MG tablet Take 0.5 tablets (0.5 mg total) by mouth daily as needed for anxiety. Inform pt 0.5 dose on backorder has to cut this in 1/2 dose 01/08/23   [name], MD  Multiple Vitamins-Minerals (CENTRUM SILVER 50+WOMEN) TABS Take 1 tablet by mouth daily with breakfast.    [provider]  nebivolol (BYSTOLIC) 10 MG tablet Take 1 tablet (10 mg total) by mouth daily. 07/10/22 01/06/23  Antonieta Iba, MD  nystatin (MYCOSTATIN/NYSTOP) powder Apply topically 2 (two) times daily. Under abdomen 02/04/23   [name], MD  ondansetron (ZOFRAN-ODT) 4 MG disintegrating tablet Take 1 tablet (4 mg total) by mouth every 8 (eight) hours as needed for nausea or vomiting. 11/27/22   Tegeler, Canary Brim, MD  polyethylene glycol powder (GLYCOLAX/MIRALAX) 17 GM/SCOOP powder Take  17 g by mouth daily as needed for moderate constipation or severe constipation. Can take up to 2x per day prn. Mix with 8 ounces of liquid 03/21/22   McLean-Scocuzza, Pasty Spillers, MD  rosuvastatin (CRESTOR) 20 MG tablet TAKE 1 TABLET BY MOUTH EVERY DAY 03/21/22   McLean-Scocuzza, Pasty Spillers, MD  torsemide (DEMADEX) 10 MG tablet Take 1 tablet (10 mg total) by mouth  daily. 07/11/21   McLean-Scocuzza, Pasty Spillers, MD  valsartan (DIOVAN) 160 MG tablet TAKE 1 TABLET BY MOUTH EVERY DAY 01/18/23   Antonieta Iba, MD    Family History Family History  Problem Relation Age of Onset   Diabetes Mother    Hypertension Mother    Stroke Mother    Diabetes Other    Healthy Father    Diabetes Sister    Heart disease Sister     Social History Social History   Tobacco Use   Smoking status: Never   Smokeless tobacco: Never  Vaping Use   Vaping status: Never Used  Substance Use Topics   Alcohol use: No   Drug use: No     Allergies   Celexa [citalopram], Jardiance [empagliflozin], Norvasc [amlodipine], Tape, Penicillin v, Penicillin v potassium, and Penicillins   Review of Systems Review of Systems  Respiratory:  Positive for cough.      Physical Exam Triage Vital Signs ED Triage Vitals  Encounter Vitals Group     BP 03/15/23 1619 (!) 212/71     Systolic BP Percentile --      Diastolic BP Percentile --      Pulse Rate 03/15/23 1619 69     Resp 03/15/23 1619 (!) 22     Temp 03/15/23 1619 98.6 F (37 C)     Temp Source 03/15/23 1619 Oral     SpO2 03/15/23 1619 95 %     Weight --      Height --      Head Circumference --      Peak Flow --      Pain Score 03/15/23 1621 8     Pain Loc --      Pain Education --      Exclude from Growth Chart --    No data found.  Updated Vital Signs BP (!) 177/70 (BP Location: Left Arm)   Pulse 69   Temp 98.6 F (37 C) (Oral)   Resp (!) 22   SpO2 95%   Visual Acuity Right Eye Distance:   Left Eye Distance:   Bilateral Distance:    Right Eye  Near:   Left Eye Near:    Bilateral Near:     Physical Exam Vitals reviewed.  Constitutional:      General: She is not in acute distress.    Appearance: She is not ill-appearing, toxic-appearing or diaphoretic.  HENT:     Right Ear: Tympanic membrane and ear canal normal.     Left Ear: Tympanic membrane and ear canal normal.     Nose: Congestion present.     Mouth/Throat:     Mouth: Mucous membranes are moist.     Pharynx: No oropharyngeal exudate or posterior oropharyngeal erythema.  Eyes:     Extraocular Movements: Extraocular movements intact.     Conjunctiva/sclera: Conjunctivae normal.     Pupils: Pupils are equal, round, and reactive to light.  Cardiovascular:     Rate and Rhythm: Normal rate and regular rhythm.     Heart sounds: No murmur heard. Pulmonary:     Effort: Pulmonary effort is normal. No respiratory distress.     Breath sounds: No stridor. No wheezing, rhonchi or rales.  Musculoskeletal:     Cervical back: Neck supple.  Lymphadenopathy:     Cervical: No cervical adenopathy.  Skin:    Capillary Refill: Capillary refill takes less than 2 seconds.     Coloration: Skin is not jaundiced or pale.  Neurological:     General: No focal deficit present.     Mental Status: She is alert and oriented to person, place, and time.  Psychiatric:        Behavior: Behavior normal.      UC Treatments / Results  Labs (all labs ordered are listed, but only abnormal results are displayed) Labs Reviewed  SARS CORONAVIRUS 2 (TAT 6-24 HRS)    EKG   Radiology No results found.  Procedures Procedures (including critical care time)  Medications Ordered in UC Medications - No data to display  Initial Impression / Assessment and Plan / UC Course  I have reviewed the triage vital signs and the nursing notes.  Pertinent labs & imaging results that were available during my care of the patient were reviewed by me and considered in my medical decision making (see chart  for details).        Her blood pressure did improve on recheck.  COVID swab was done with positive we will notify her.  She is a candidate for molnupiravir as tomorrow would be day 5 when the test should result.  Her last few EGFR measurements have been between 25 and 31.  I recommended Mucinex plain for her symptoms. Final Clinical Impressions(s) / UC Diagnoses   Final diagnoses:  Viral upper respiratory tract infection     Discharge Instructions       You have been swabbed for COVID, and the test will result in the next 24 hours. Our staff will call you if positive. If the COVID test is positive, you should quarantine until you are fever free for 24 hours and you are starting to feel better, and then take added precautions for the next 5 days, such as physical distancing/wearing a mask and good hand hygiene/washing.  Please consider calling the clinic tomorrow afternoon, September 28, to check on your result, since you do not have access to the portal for results.  It is okay for you to take Mucinex plain (no letters after it)     ED Prescriptions   None    PDMP not reviewed this encounter.   Zenia Resides, MD 03/15/23 [phone]

## 2023-03-15 NOTE — ED Triage Notes (Signed)
Patient c/o a productive cough with yellow/green sputum, generalized body aches, SOB x 4 days.  Patient states she has been taking Mucinex and Robitussin.

## 2023-03-15 NOTE — Discharge Instructions (Signed)
  You have been swabbed for COVID, and the test will result in the next 24 hours. Our staff will call you if positive. If the COVID test is positive, you should quarantine until you are fever free for 24 hours and you are starting to feel better, and then take added precautions for the next 5 days, such as physical distancing/wearing a mask and good hand hygiene/washing.  Please consider calling the clinic tomorrow afternoon, September 28, to check on your result, since you do not have access to the portal for results.  It is okay for you to take Mucinex plain (no letters after it)

## 2023-03-16 ENCOUNTER — Telehealth: Payer: Self-pay

## 2023-03-16 LAB — SARS CORONAVIRUS 2 (TAT 6-24 HRS): SARS Coronavirus 2: POSITIVE — AB

## 2023-03-16 MED ORDER — MOLNUPIRAVIR EUA 200MG CAPSULE: 4.0000 | ORAL_CAPSULE | Freq: Two times a day (BID) | ORAL | 0 refills | Status: AC

## 2023-03-16 NOTE — Telephone Encounter (Signed)
Per provider's note, "She is a candidate for molnupiravir as tomorrow would be day 5 when the test should result. Her last few EGFR measurements have been between 25 and 31." Reviewed with patient, verified pharmacy, prescription sent.

## 2023-03-20 ENCOUNTER — Telehealth: Payer: Self-pay | Admitting: Family Medicine

## 2023-03-20 NOTE — Telephone Encounter (Signed)
Pt granddaughter called stating pt tested positive for covid. Pt went to UC and was given the antiviral medication but she is still not feeling well. Pt granddaughter want to know if the pt is able to get an antibiotic from the provider

## 2023-03-20 NOTE — Telephone Encounter (Signed)
Noted  

## 2023-03-20 NOTE — Telephone Encounter (Signed)
Patient states she is following-up on her previous call.  I spoke with Vertis Kelch, CMA, and she said patient will need to be seen before we can prescribe an antibiotic for her.  I relayed this message to patient.  I offered to schedule an appointment for patient tomorrow.  Patient states she does not have transportation to be able to come in tomorrow.  Patient states she is unable to do a virtual visit.  After my call ended with patient, I spoke with my supervisor, Georga Bora, who states that we can offer one telephone visit to patient with the understanding that she may receive a bill for the visit.  I called patient and left a detailed voicemail relaying the information I received from [place].  I let patient know that I did go ahead and schedule a telephone visit for her tomorrow with Dr. [name] at 8:40am.  I asked patient to please call us back and let us know whether or not she would like to keep this appointment.

## 2023-03-20 NOTE — Telephone Encounter (Signed)
Patient states she is taking the antiviral for COVID, she is on the 5th day and she feels worse. She has: no fever, survive head congestion, some body aches, some chills, weak, blowing out clear mucus.Please call her.

## 2023-03-20 NOTE — Telephone Encounter (Signed)
I spoke with patient to let her know that Dr. [name] will need to see her in-person.  Patient states she will just go to the hospital if she feels like she is getting worse.  I cancelled the telephone visit patient had scheduled for 03/21/2023.

## 2023-03-21 ENCOUNTER — Emergency Department (HOSPITAL_COMMUNITY)
Admission: EM | Admit: 2023-03-21 | Discharge: 2023-03-21 | Disposition: A | Discharge status: Home / Self Care | Payer: Medicare Other | Attending: Emergency Medicine | Admitting: Emergency Medicine

## 2023-03-21 ENCOUNTER — Telehealth: Payer: Self-pay

## 2023-03-21 ENCOUNTER — Other Ambulatory Visit: Payer: Self-pay

## 2023-03-21 ENCOUNTER — Telehealth: Payer: Medicare Other | Admitting: Family Medicine

## 2023-03-21 DIAGNOSIS — N3000 Acute cystitis without hematuria: Secondary | ICD-10-CM | POA: Insufficient documentation

## 2023-03-21 DIAGNOSIS — I10 Essential (primary) hypertension: Secondary | ICD-10-CM | POA: Diagnosis not present

## 2023-03-21 DIAGNOSIS — Z79899 Other long term (current) drug therapy: Secondary | ICD-10-CM | POA: Diagnosis not present

## 2023-03-21 DIAGNOSIS — M79605 Pain in left leg: Secondary | ICD-10-CM | POA: Diagnosis not present

## 2023-03-21 DIAGNOSIS — R200 Anesthesia of skin: Secondary | ICD-10-CM | POA: Diagnosis present

## 2023-03-21 DIAGNOSIS — Z7982 Long term (current) use of aspirin: Secondary | ICD-10-CM | POA: Insufficient documentation

## 2023-03-21 DIAGNOSIS — Z794 Long term (current) use of insulin: Secondary | ICD-10-CM | POA: Diagnosis not present

## 2023-03-21 DIAGNOSIS — E119 Type 2 diabetes mellitus without complications: Secondary | ICD-10-CM | POA: Insufficient documentation

## 2023-03-21 LAB — BASIC METABOLIC PANEL
Anion gap: 12 (ref 5–15)
BUN: 24 mg/dL — ABNORMAL HIGH (ref 8–23)
CO2: 25 mmol/L (ref 22–32)
Calcium: 10.6 mg/dL — ABNORMAL HIGH (ref 8.9–10.3)
Chloride: 93 mmol/L — ABNORMAL LOW (ref 98–111)
Creatinine, Ser: 1.28 mg/dL — ABNORMAL HIGH (ref 0.44–1.00)
GFR, Estimated: 41 mL/min — ABNORMAL LOW (ref >60)
Glucose, Bld: 247 mg/dL — ABNORMAL HIGH (ref 70–99)
Potassium: 4.9 mmol/L (ref 3.5–5.1)
Sodium: 130 mmol/L — ABNORMAL LOW (ref 135–145)

## 2023-03-21 LAB — CBC WITH DIFFERENTIAL/PLATELET
Abs Immature Granulocytes: 0.06 10*3/uL (ref 0.00–0.07)
Basophils Absolute: 0.1 10*3/uL (ref 0.0–0.1)
Basophils Relative: 1 %
Eosinophils Absolute: 0.1 10*3/uL (ref 0.0–0.5)
Eosinophils Relative: 1 %
HCT: 39.7 % (ref 36.0–46.0)
Hemoglobin: 12.9 g/dL (ref 12.0–15.0)
Immature Granulocytes: 1 %
Lymphocytes Relative: 41 %
Lymphs Abs: 4.4 10*3/uL — ABNORMAL HIGH (ref 0.7–4.0)
MCH: 29.4 pg (ref 26.0–34.0)
MCHC: 32.5 g/dL (ref 30.0–36.0)
MCV: 90.4 fL (ref 80.0–100.0)
Monocytes Absolute: 1 10*3/uL (ref 0.1–1.0)
Monocytes Relative: 9 %
Neutro Abs: 4.9 10*3/uL (ref 1.7–7.7)
Neutrophils Relative %: 47 %
Platelets: 325 10*3/uL (ref 150–400)
RBC: 4.39 MIL/uL (ref 3.87–5.11)
RDW: 13.1 % (ref 11.5–15.5)
WBC: 10.6 10*3/uL — ABNORMAL HIGH (ref 4.0–10.5)
nRBC: 0 % (ref 0.0–0.2)

## 2023-03-21 LAB — URINALYSIS, W/ REFLEX TO CULTURE (INFECTION SUSPECTED)
Bilirubin Urine: NEGATIVE
Glucose, UA: >=500 mg/dL — AB
Hgb urine dipstick: NEGATIVE
Ketones, ur: NEGATIVE mg/dL
Nitrite: NEGATIVE
Protein, ur: 100 mg/dL — AB
Specific Gravity, Urine: 1.008 (ref 1.005–1.030)
pH: 6 (ref 5.0–8.0)

## 2023-03-21 LAB — CBG MONITORING, ED: Glucose-Capillary: 244 mg/dL — ABNORMAL HIGH (ref 70–99)

## 2023-03-21 MED ORDER — CEPHALEXIN 500 MG PO CAPS
500.0000 mg | ORAL_CAPSULE | Freq: Once | ORAL | Status: AC
  Administered 2023-03-21: 500 mg via ORAL
  Filled 2023-03-21: qty 1

## 2023-03-21 MED ORDER — CEPHALEXIN 500 MG PO CAPS: 500.0000 mg | ORAL_CAPSULE | Freq: Four times a day (QID) | ORAL | 0 refills | Status: DC

## 2023-03-21 NOTE — ED Provider Notes (Signed)
[place] EMERGENCY DEPARTMENT AT [facility] Provider Note   CSN: [id number] Arrival date & time: 03/21/23  1208     History  Chief Complaint  Patient presents with   Leg Pain   Numbness    Alexis Farmer is a 82 y.o. female.  Patient is an 82 year old female with a past medical history of hypertension and diabetes presenting to the emergency department with left leg numbness.  Patient states that she woke up this morning to go to the bathroom and then was unable to get up off the toilet because her left leg was numb and weak.  She states that she was able to slide herself off the toilet and crawl to the phone to call 911.  She states that when EMS arrived she was able to stand and put weight on her leg but they did not have her try to ambulate.  She states that her numbness and weakness have now resolved and that her legs just feel sore from crawling on them.  She denied any associated arm or face involvement, no change in speech.  She states that she was not on the toilet for prolonged period of time. Reports she recently got over a COVID infection but symptoms have essentially resolved.  The history is provided by the patient.  Leg Pain      Home Medications Prior to Admission medications   Medication Sig Start Date End Date Taking? Authorizing Provider  cephALEXin (KEFLEX) 500 MG capsule Take 1 capsule (500 mg total) by mouth 4 (four) times daily. 03/21/23  Yes Elayne Snare K, DO  acetaminophen (TYLENOL) 500 MG tablet Take 500 mg by mouth every 6 (six) hours as needed for mild pain or headache.    [provider]  Ascorbic Acid (VITAMIN C PO) Take 1 tablet by mouth daily.    [provider]  aspirin EC 81 MG tablet Take 81 mg by mouth 2 (two) times a week. Swallow whole.    [provider]  blood glucose meter kit and supplies KIT Accu chek, Dx code E11.65, check 3 times daily 07/26/17   Glori Luis, MD  Blood Pressure KIT 1  Device by Does not apply route daily. 10/24/21   McLean-Scocuzza, Pasty Spillers, MD  cloNIDine (CATAPRES) 0.2 MG tablet Take 1 tablet (0.2 mg total) by mouth 2 (two) times daily. Patient taking differently: Take 0.2 mg by mouth 3 (three) times daily. 12/18/21   Antonieta Iba, MD  glucose blood (ACCU-CHEK GUIDE) test strip USE AS INSTRUCTED 3 TIMES DAILY 09/06/22   [name], MD  hydrALAZINE (APRESOLINE) 100 MG tablet TAKE 1 TABLET (100 MG TOTAL) BY MOUTH 4 (FOUR) TIMES DAILY. 09/11/22   Gollan, Tollie Pizza, MD  insulin NPH Human (NOVOLIN N) 100 UNIT/ML injection INJECT 20 UNITS IN THE MORNING AND THE EVENING WITH A MEAL Patient taking differently: 40 Units 2 (two) times daily before a meal. 04/10/21   McLean-Scocuzza, Pasty Spillers, MD  insulin regular (NOVOLIN R) 100 units/mL injection Inject 0-0.08 mLs (0-8 Units total) into the skin 3 (three) times daily before meals. 10/17/20   Romero Belling, MD  Insulin Syringe-Needle U-100 (BD INSULIN SYRINGE U/F) 30G X 1/2" 0.5 ML MISC USE UP TO 5X PER DAY WITH INSULIN 2X (NPH) AND 3X (REGULAR) 12/18/22   [name], MD  LORazepam (ATIVAN) 1 MG tablet Take 0.5 tablets (0.5 mg total) by mouth daily as needed for anxiety. Inform pt 0.5 dose on backorder has to cut  this in 1/2 dose 01/08/23   [name], MD  molnupiravir EUA (LAGEVRIO) 200 mg CAPS capsule Take 4 capsules (800 mg total) by mouth 2 (two) times daily for 5 days. 03/16/23 03/21/23  LampteyBritta Mccreedy, MD  Multiple Vitamins-Minerals (CENTRUM SILVER 50+WOMEN) TABS Take 1 tablet by mouth daily with breakfast.    [provider]  nebivolol (BYSTOLIC) 10 MG tablet Take 1 tablet (10 mg total) by mouth daily. 07/10/22 01/06/23  Antonieta Iba, MD  nystatin (MYCOSTATIN/NYSTOP) powder Apply topically 2 (two) times daily. Under abdomen 02/04/23   [name], MD  ondansetron (ZOFRAN-ODT) 4 MG disintegrating tablet Take 1 tablet (4 mg total) by mouth every 8 (eight) hours as needed for nausea or vomiting. 11/27/22    Tegeler, Canary Brim, MD  polyethylene glycol powder (GLYCOLAX/MIRALAX) 17 GM/SCOOP powder Take 17 g by mouth daily as needed for moderate constipation or severe constipation. Can take up to 2x per day prn. Mix with 8 ounces of liquid 03/21/22   McLean-Scocuzza, Pasty Spillers, MD  rosuvastatin (CRESTOR) 20 MG tablet TAKE 1 TABLET BY MOUTH EVERY DAY 03/21/22   McLean-Scocuzza, Pasty Spillers, MD  torsemide (DEMADEX) 10 MG tablet Take 1 tablet (10 mg total) by mouth daily. 07/11/21   McLean-Scocuzza, Pasty Spillers, MD  valsartan (DIOVAN) 160 MG tablet TAKE 1 TABLET BY MOUTH EVERY DAY 01/18/23   Antonieta Iba, MD      Allergies    Celexa [citalopram], Jardiance [empagliflozin], Norvasc [amlodipine], Tape, Penicillin v, Penicillin v potassium, and Penicillins    Review of Systems   Review of Systems  Physical Exam Updated Vital Signs BP (!) 110/59   Pulse 71   Temp 98.2 F (36.8 C)   Resp 16   Ht 5\' 3"  (1.6 m)   Wt 114 kg   SpO2 94%   BMI 44.52 kg/m  Physical Exam Vitals and nursing note reviewed.  Constitutional:      General: She is not in acute distress.    Appearance: Normal appearance.  HENT:     Head: Normocephalic and atraumatic.     Nose: Nose normal.     Mouth/Throat:     Mouth: Mucous membranes are moist.     Pharynx: Oropharynx is clear.  Eyes:     Extraocular Movements: Extraocular movements intact.     Conjunctiva/sclera: Conjunctivae normal.     Pupils: Pupils are equal, round, and reactive to light.  Cardiovascular:     Rate and Rhythm: Normal rate and regular rhythm.     Heart sounds: Normal heart sounds.  Pulmonary:     Effort: Pulmonary effort is normal.     Breath sounds: Normal breath sounds.  Abdominal:     General: Abdomen is flat.     Palpations: Abdomen is soft.     Tenderness: There is no abdominal tenderness.  Musculoskeletal:        General: Normal range of motion.     Cervical back: Normal range of motion.     Comments: Negative straight leg raise  Skin:     General: Skin is warm and dry.     Comments: Mild erythema to L shin - improved per patient at baseline, patient reports LLE is slightly larger than RLE at baseline  Neurological:     General: No focal deficit present.     Mental Status: She is alert and oriented to person, place, and time.     Cranial Nerves: No cranial nerve deficit.     Sensory:  No sensory deficit.     Motor: No weakness.     Comments: No drift in all 4 extremities.  Psychiatric:        Mood and Affect: Mood normal.        Behavior: Behavior normal.     ED Results / Procedures / Treatments   Labs (all labs ordered are listed, but only abnormal results are displayed) Labs Reviewed  CBC WITH DIFFERENTIAL/PLATELET - Abnormal; Notable for the following components:      Result Value   WBC 10.6 (*)    Lymphs Abs 4.4 (*)    All other components within normal limits  BASIC METABOLIC PANEL - Abnormal; Notable for the following components:   Sodium 130 (*)    Chloride 93 (*)    Glucose, Bld 247 (*)    BUN 24 (*)    Creatinine, Ser 1.28 (*)    Calcium 10.6 (*)    GFR, Estimated 41 (*)    All other components within normal limits  URINALYSIS, W/ REFLEX TO CULTURE (INFECTION SUSPECTED) - Abnormal; Notable for the following components:   Glucose, UA >=500 (*)    Protein, ur 100 (*)    Leukocytes,Ua MODERATE (*)    Bacteria, UA RARE (*)    All other components within normal limits  CBG MONITORING, ED - Abnormal; Notable for the following components:   Glucose-Capillary 244 (*)    All other components within normal limits    EKG EKG Interpretation Date/Time:  Thursday March 21 2023 12:33:31 EDT Ventricular Rate:  67 PR Interval:  235 QRS Duration:  109 QT Interval:  413 QTC Calculation: 436 R Axis:   -59  Text Interpretation: Sinus rhythm Prolonged PR interval Left anterior fascicular block Abnormal R-wave progression, late transition LVH with secondary repolarization abnormality No significant change  since last tracing Confirmed by Elayne Snare ([id number]) on 03/21/2023 12:43:17 PM  Radiology No results found.  Procedures Procedures    Medications Ordered in ED Medications  cephALEXin (KEFLEX) capsule 500 mg (has no administration in time range)    ED Course/ Medical Decision Making/ A&P Clinical Course as of 03/21/23 1557  Thu Mar 21, 2023  1342 Patient able to ambulate to the bathroom with walker. [VK]  1445 Mild hyponatremia, corrected to 132 at baseline. Mild hyperglycemia without evidence of DKA. Cr improved from baseline. [VK]  1501 Patient reports urinary frequent and dysuria. Will have UA performed. Reports still feeling burning type of pain in L leg and not completely back to normal but strength and sensation intact with normal gait. [VK]  1539 Moderate leuks, rare bacteria. With symptoms will treat with short course of antibiotics. [VK]    Clinical Course User Index [VK] Rexford Maus, DO                                 Medical Decision Making This patient presents to the ED with chief complaint(s) of L leg weakness/numbness with pertinent past medical history of HTN, DM which further complicates the presenting complaint. The complaint involves an extensive differential diagnosis and also carries with it a high risk of complications and morbidity.    The differential diagnosis includes suspect likely peripheral neuropathy with unilateral leg weakness after sitting on the toilet, unlikely CVA or TIA with unilateral leg involvement, no evidence of sciatica on exam, she is neurovascularly intact making neurovascular injury unlikely, no signs of trauma making traumatic injury  unlikely, considering possible electrolyte abnormality  Additional history obtained: Additional history obtained from N/A Records reviewed Primary Care Documents  ED Course and Reassessment: On patient's arrival she is hemodynamically stable in no acute distress without focal neurologic  deficits and is currently asymptomatic at this time.  She has no signs of traumatic injury from her falls.  She was hyperglycemic for EMS and will have repeat glucose here and labs to evaluate for electrolyte derangement causing her numbness as suspect likely peripheral neuropathy.  Patient will attempt to have an ambulatory trial and will be closely reassessed.  Independent labs interpretation:  The following labs were independently interpreted: Urine positive for UTI, hyperglycemia without evidence of DKA, labs otherwise at patient's baseline  Independent visualization of imaging: - N/A  Consultation: - Consulted or discussed management/test interpretation w/ external professional: N/A  Consideration for admission or further workup: Patient has no emergent conditions requiring admission or further work-up at this time and is stable for discharge home with primary care follow-up  Social Determinants of health: N/A    Amount and/or Complexity of Data Reviewed Labs: ordered.  Risk Prescription drug management.          Final Clinical Impression(s) / ED Diagnoses Final diagnoses:  Left leg pain  Acute cystitis without hematuria    Rx / DC Orders ED Discharge Orders          Ordered    cephALEXin (KEFLEX) 500 MG capsule  4 times daily        03/21/23 1555              Rexford Maus, DO 03/21/23 1557

## 2023-03-21 NOTE — ED Triage Notes (Signed)
Pt was using the bathroom and when she went to get up her left leg would not work. Pt slid herself off toilet and crawled into living room to call 911. pT C/O left leg pain and numbness. Pt did  not fall or hit  head. Pt has hx of HTN and DM2 bp 246/100 and says she's taking meds. Cbg 362. This happened around 1030am when she went to the bathroom

## 2023-03-21 NOTE — Discharge Instructions (Signed)
You were seen in the emergency department for your leg pain and your urinary frequency.  Your leg pain is likely due to sitting on the toilet for prolonged period of time causing some compression of the nerve in your leg.  You had no signs of stroke or injury on your exam.  You should try to limit your amount of time on the toilet and try to offset the pressure on the back of your left leg to help prevent compression of that nerve.  You did have high blood sugar here in the emergency department as well as signs of urinary tract infection.  I have given you a course of antibiotics and you should complete these as prescribed.  You can also follow-up with your primary doctor to have your blood sugar rechecked as well as your symptoms.  You should return to the emergency department if you have weakness of the full left side of your body, you are unable to walk, you have fevers, you have had it of vomiting and are unable to tolerate the antibiotics or if you have any other new or concerning symptoms.

## 2023-03-21 NOTE — Patient Outreach (Signed)
Care Coordination   03/21/2023 Name: Alexis Farmer MRN: [id number] DOB: 1940-09-08   Care Coordination Outreach Attempts:  An unsuccessful telephone outreach was attempted today to offer the patient information about available care coordination services.   HIPAA compliant message left with return call number.  Follow Up Plan:  Additional outreach attempts will be made to offer the patient care coordination information and services.   Encounter Outcome:  No Answer   Care Coordination Interventions:  No, not indicated    [name] [facility] Care Coordination [phone] direct line

## 2023-03-22 ENCOUNTER — Ambulatory Visit: Payer: Self-pay

## 2023-03-22 NOTE — Patient Outreach (Signed)
Care Coordination   Follow Up Visit Note   03/22/2023 Name: Alexis Farmer MRN: [id number] DOB: 1940-06-21  Alexis Farmer is a 82 y.o. year old female who sees [name], MD for primary care. I spoke with  [name] by phone today.  What matters to the patients health and wellness today?  Patient reports being seen in the ED for  left leg pain on 03/21/23.  She reports her legs were so weak once returning home from the ED she had difficulty getting into her home. She states she had to have assistance from several people to get into her home and fell 2 x without serious injury.    Goals Addressed             This Visit's Progress    Patient Stated:  " Getting my diabetes under control and manage heart failure and chronic health conditions"       Interventions Today    Flowsheet Row Most Recent Value  Chronic Disease   Chronic disease during today's visit Other  [covid, left leg pain, UTI]  General Interventions   General Interventions Discussed/Reviewed General Interventions Reviewed, Doctor Visits  [evaluation of current treatment related to COVID/ left leg pain, UTI and patients adherence to plan as established by provider. Assessed for ongoing COVID/ UTI symptoms and leg pain.]  Doctor Visits Discussed/Reviewed Doctor Visits Reviewed  Annabell Sabal upcoming provider visits.  Offered to arrange virtual follow up visit with primary care provider post ED visits x 2.]  Mental Health Interventions   Mental Health Discussed/Reviewed Mental Health Reviewed  Jorje Guild listening and support]  Pharmacy Interventions   Pharmacy Dicussed/Reviewed Pharmacy Topics Reviewed  [Medications reviewed. Advised to take antibiotic to completion.  Advised to take all medications as prescribed.]  Safety Interventions   Safety Discussed/Reviewed Fall Risk  [Assessed for falls. Assessed for injury related to 2 falls sustained on 03/21/23]                 SDOH assessments and interventions  completed:  No     Care Coordination Interventions:  Yes, provided   Follow up plan: Follow up call scheduled for 04/23/23    Encounter Outcome:  Patient Visit Completed   [name] RN,BSN,CCM [facility] Care Coordination [phone] direct line

## 2023-03-26 ENCOUNTER — Telehealth: Payer: Self-pay

## 2023-03-26 ENCOUNTER — Telehealth (INDEPENDENT_AMBULATORY_CARE_PROVIDER_SITE_OTHER): Payer: Medicare Other | Admitting: Family Medicine

## 2023-03-26 ENCOUNTER — Telehealth: Payer: Medicare Other | Admitting: Family Medicine

## 2023-03-26 DIAGNOSIS — H543 Unqualified visual loss, both eyes: Secondary | ICD-10-CM

## 2023-03-26 DIAGNOSIS — M48061 Spinal stenosis, lumbar region without neurogenic claudication: Secondary | ICD-10-CM

## 2023-03-26 DIAGNOSIS — Z753 Unavailability and inaccessibility of health-care facilities: Secondary | ICD-10-CM | POA: Diagnosis not present

## 2023-03-26 DIAGNOSIS — E1142 Type 2 diabetes mellitus with diabetic polyneuropathy: Secondary | ICD-10-CM

## 2023-03-26 DIAGNOSIS — Z5982 Transportation insecurity: Secondary | ICD-10-CM

## 2023-03-26 DIAGNOSIS — N3000 Acute cystitis without hematuria: Secondary | ICD-10-CM

## 2023-03-26 DIAGNOSIS — R5381 Other malaise: Secondary | ICD-10-CM | POA: Diagnosis not present

## 2023-03-26 DIAGNOSIS — M47816 Spondylosis without myelopathy or radiculopathy, lumbar region: Secondary | ICD-10-CM

## 2023-03-26 DIAGNOSIS — Z6841 Body Mass Index (BMI) 40.0 and over, adult: Secondary | ICD-10-CM

## 2023-03-26 DIAGNOSIS — E1129 Type 2 diabetes mellitus with other diabetic kidney complication: Secondary | ICD-10-CM | POA: Diagnosis not present

## 2023-03-26 NOTE — Progress Notes (Signed)
Virtual Visit via Video note  I connected with Alexis Farmer on 03/31/23 at 1203 by video and verified that I am speaking with the correct person using two identifiers. Alexis Farmer is currently located at home and granddaughter, Alexis Farmer and daughter Alexis Farmer is currently with her during visit. The provider, [name], MD is located in their office at time of visit.  I discussed the limitations, risks, security and privacy concerns of performing an evaluation and management service by video and the availability of in person appointments. I also discussed with the patient that there may be a patient responsible charge related to this service. The patient expressed understanding and agreed to proceed.  Subjective: PCP: [name], MD  Chief Complaint  Patient presents with   Covid Positive    Test + 2 weeks ago and finished mediation 2 days ago and she having trouble walking. She is on antibiotic. Legs are burning and losing mobility.     HPI  Discussed the use of AI scribe software for clinical note transcription with the patient, who gave verbal consent to proceed.  History of Present Illness The patient, born on May 15, 1941, presents with a chief complaint of mobility issues and weakness, particularly in the left leg. She reports a recent fall at home, which required assistance from emergency services. The patient describes difficulty in lifting the left leg due to hip joint issues, which has resulted in an inability to navigate steps or uneven surfaces. This has led to a fear of falling, as the patient feels her balance is compromised, even when using a walker.  The patient also reports a recent diagnosis of COVID-19, for which she completed a course of antibiotics. She mentions a recent hospital visit, during which no imaging was performed despite her fall and mobility issues. The patient also reports a history of bladder infection, for which she was treated with  antibiotics.  The patient has multiple upcoming appointments with various specialists, including an oncologist and a nephrologist, but expresses concern about her ability to attend these due to her mobility issues. She is also concerned about her ability to complete necessary blood tests for these appointments.  The patient lives alone but has family members who check on her. She is due to move to a senior living facility in January. She expresses concern about her ability to manage her health and mobility until then. She also expresses dissatisfaction with her current diet, which she feels is contributing to high blood sugar levels.  The patient has a history of lumbar spinal stenosis, which may be contributing to her current mobility issues. She also reports a history of cancer, for which she is under the care of an oncologist.     ROS: Per HPI  Current Outpatient Medications:    acetaminophen (TYLENOL) 500 MG tablet, Take 500 mg by mouth every 6 (six) hours as needed for mild pain or headache., Disp: , Rfl:    Ascorbic Acid (VITAMIN C PO), Take 1 tablet by mouth daily., Disp: , Rfl:    aspirin EC 81 MG tablet, Take 81 mg by mouth 2 (two) times a week. Swallow whole., Disp: , Rfl:    blood glucose meter kit and supplies KIT, Accu chek, Dx code E11.65, check 3 times daily, Disp: 1 each, Rfl: 0   Blood Pressure KIT, 1 Device by Does not apply route daily., Disp: 1 kit, Rfl: 0   cephALEXin (KEFLEX) 500 MG capsule, Take 1 capsule (500 mg total) by mouth 4 (  four) times daily., Disp: 20 capsule, Rfl: 0   cloNIDine (CATAPRES) 0.2 MG tablet, Take 1 tablet (0.2 mg total) by mouth 2 (two) times daily. (Patient taking differently: Take 0.2 mg by mouth 3 (three) times daily.), Disp: 270 tablet, Rfl: 3   glucose blood (ACCU-CHEK GUIDE) test strip, USE AS INSTRUCTED 3 TIMES DAILY, Disp: 300 strip, Rfl: 12   hydrALAZINE (APRESOLINE) 100 MG tablet, TAKE 1 TABLET (100 MG TOTAL) BY MOUTH 4 (FOUR) TIMES DAILY.,  Disp: 360 tablet, Rfl: 2   insulin NPH Human (NOVOLIN N) 100 UNIT/ML injection, INJECT 20 UNITS IN THE MORNING AND THE EVENING WITH A MEAL (Patient taking differently: 40 Units 2 (two) times daily before a meal.), Disp: 40 mL, Rfl: PRN   insulin regular (NOVOLIN R) 100 units/mL injection, Inject 0-0.08 mLs (0-8 Units total) into the skin 3 (three) times daily before meals., Disp: 30 mL, Rfl: 11   Insulin Syringe-Needle U-100 (BD INSULIN SYRINGE U/F) 30G X 1/2" 0.5 ML MISC, USE UP TO 5X PER DAY WITH INSULIN 2X (NPH) AND 3X (REGULAR), Disp: 500 each, Rfl: 12   LORazepam (ATIVAN) 1 MG tablet, Take 0.5 tablets (0.5 mg total) by mouth daily as needed for anxiety. Inform pt 0.5 dose on backorder has to cut this in 1/2 dose, Disp: 15 tablet, Rfl: 0   Multiple Vitamins-Minerals (CENTRUM SILVER 50+WOMEN) TABS, Take 1 tablet by mouth daily with breakfast., Disp: , Rfl:    nystatin (MYCOSTATIN/NYSTOP) powder, Apply topically 2 (two) times daily. Under abdomen, Disp: 60 g, Rfl: 11   ondansetron (ZOFRAN-ODT) 4 MG disintegrating tablet, Take 1 tablet (4 mg total) by mouth every 8 (eight) hours as needed for nausea or vomiting., Disp: 20 tablet, Rfl: 0   polyethylene glycol powder (GLYCOLAX/MIRALAX) 17 GM/SCOOP powder, Take 17 g by mouth daily as needed for moderate constipation or severe constipation. Can take up to 2x per day prn. Mix with 8 ounces of liquid, Disp: 3350 g, Rfl: 11   rosuvastatin (CRESTOR) 20 MG tablet, TAKE 1 TABLET BY MOUTH EVERY DAY, Disp: 90 tablet, Rfl: 3   torsemide (DEMADEX) 10 MG tablet, Take 1 tablet (10 mg total) by mouth daily., Disp: 90 tablet, Rfl: 3   valsartan (DIOVAN) 160 MG tablet, TAKE 1 TABLET BY MOUTH EVERY DAY, Disp: 90 tablet, Rfl: 0   nebivolol (BYSTOLIC) 10 MG tablet, Take 1 tablet (10 mg total) by mouth daily., Disp: 90 tablet, Rfl: 2  Observations/Objective: Physical Exam Pulmonary:     Effort: Pulmonary effort is normal.  Neurological:     Mental Status: She is alert  and oriented to person, place, and time. Mental status is at baseline.  Psychiatric:        Mood and Affect: Mood normal.        Behavior: Behavior normal.        Thought Content: Thought content normal.        Judgment: Judgment normal.    Assessment and Plan: Physical deconditioning Assessment & Plan: Mobility and Fall Risk Recent fall at home with difficulty getting up. No acute injury reported. Patient reports weakness and balance issues, particularly with left leg. Unable to step up onto concrete platform outside home. No imaging done at recent ED visit. -Referral to [facility] for evaluation of home care services, including physical and occupational therapy. -Consider imaging of hip and lumbar spine if patient can be transported safely to clinic.  Orders: -     Ambulatory referral to Home Health  Type 2 diabetes  mellitus with other diabetic kidney complication [facility]) Assessment & Plan: Patient reports high blood sugars, possibly related to dietary choices. -Encourage patient to monitor blood sugars and adhere to a diabetic-friendly diet. -Follows with Endocrinology and Nephrology  Orders: -     Ambulatory referral to Home Health  Spinal stenosis of lumbar region, unspecified whether neurogenic claudication present Assessment & Plan: Contributing to increase mobility and fall risk. Recent fall at home with difficulty getting up. No acute injury reported. Patient reports weakness and balance issues, particularly with left leg. Unable to step up onto concrete platform outside home. -Referral to [facility] for evaluation of home care services, including physical and occupational therapy.   Orders: -     Ambulatory referral to Home Health  Inability to access health care due to transportation insecurity Assessment & Plan: Patient has upcoming appointments with multiple specialists but is unable to leave home due to mobility issues. Concerns about maintaining care if unable to attend  appointments. -Explore options for home phlebotomy services. -Encourage patient to contact specialists' offices to discuss options, including telehealth visits.  Orders: -     Ambulatory referral to Home Health  Acute cystitis without hematuria Assessment & Plan: Recently treated with antibiotics, with one day of treatment remaining. No current urinary symptoms reported. -Complete current course of antibiotics.   Diabetic polyneuropathy associated with type 2 diabetes mellitus (HCC) -     Ambulatory referral to Home Health  Arthritis of lumbar spine -     Ambulatory referral to Home Health  Morbid obesity with BMI of 45.0-49.9, adult [facility]) -     Ambulatory referral to Home Health  Vision loss, bilateral -     Ambulatory referral to Home Health   Follow Up Instructions: Return if symptoms worsen or fail to improve, for PCP.   I discussed the assessment and treatment plan with the patient. The patient was provided an opportunity to ask questions and all were answered. The patient agreed with the plan and demonstrated an understanding of the instructions.   The patient was advised to call back or seek an in-person evaluation if the symptoms worsen or if the condition fails to improve as anticipated.  The above assessment and management plan was discussed with the patient. The patient verbalized understanding of and has agreed to the management plan. Patient is aware to call the clinic if symptoms persist or worsen. Patient is aware when to return to the clinic for a follow-up visit. Patient educated on when it is appropriate to go to the emergency department.    [name], MD

## 2023-03-26 NOTE — Patient Outreach (Signed)
Care Coordination   Follow Up Visit Note   03/26/2023 Name: Kerry-Ann Flager MRN: [id number] DOB: 05/24/1941  Parisha Amey is a 82 y.o. year old female who sees [name], MD for primary care. I spoke with  [name] by phone today.  What matters to the patients health and wellness today?  Returned call to patient as requested.  Patient states she continues to have ongoing leg weakness and is concerned she will not be able to get out of her home to her scheduled doctor visits at the end of this month.  Patient agreeable to discussing her leg weakness with her primary care provider today at their scheduled telephone visit.     Goals Addressed             This Visit's Progress    management of lower extremity weakness.       Interventions Today    Flowsheet Row Most Recent Value  Chronic Disease   Chronic disease during today's visit Other  [bilateral leg weakness]  General Interventions   General Interventions Discussed/Reviewed General Interventions Reviewed, Doctor Visits  [evaluation of current treatment plan for LE weakness and patients adherence to plan as established by provider. Assessed for ongoing leg weakness and for any new symptoms.]  Doctor Visits Discussed/Reviewed Doctor Visits Reviewed  [Patient advised to discuss ongoing LE weakness at telephone visit with primary care provider today. Message sent by this RN care coordinator to primary care provider updating her on patients recent ED visit and request for possible referral for Home PT.]              SDOH assessments and interventions completed:  No     Care Coordination Interventions:  Yes, provided   Follow up plan: Follow up call scheduled for as previously scheduled     Encounter Outcome:  Patient Visit Completed   [name] RN,BSN,CCM [facility] Care Coordination [phone] direct line

## 2023-03-29 ENCOUNTER — Telehealth: Payer: Self-pay

## 2023-03-29 ENCOUNTER — Telehealth: Payer: Self-pay | Admitting: Family Medicine

## 2023-03-29 NOTE — Telephone Encounter (Signed)
This encounter was created in error - please disregard.

## 2023-03-29 NOTE — Telephone Encounter (Signed)
Asking the provider to please respond to the nurse case workers message that was sent on 03/28/23.

## 2023-03-29 NOTE — Telephone Encounter (Signed)
Patient called stating she was to receive in home physical therapy . She stated her provider was to put in a referral to home health for physical therapy . Patient said ," I do not want to stop walking."

## 2023-03-29 NOTE — Patient Outreach (Signed)
Care Coordination   Follow Up Visit Note   03/29/2023 Name: Adah Wegner MRN: [id number] DOB: 09/12/1940  Majel Melo is a 82 y.o. year old female who sees [name], MD for primary care. I spoke with  [name] by phone today.  What matters to the patients health and wellness today?  Patient requesting follow up on home health referral.   Telephone call to Erie Noe with the primary care provider office. Notified Erie Noe that RNCM responded to primary care provider's message via Mychart on yesterday regarding home health referral and requested that referral be submitted.  Erie Noe states she will follow up further with primary care provider.   Goals Addressed             This Visit's Progress    Patient Stated:  " Getting my diabetes under control and manage heart failure and chronic health conditions"       Interventions Today    Flowsheet Row Most Recent Value  Chronic Disease   Chronic disease during today's visit Other  [Left leg weakness]  General Interventions   General Interventions Discussed/Reviewed Communication with  Communication with PCP/Specialists  [telephone call to Erie Noe with primary provder office to follow up on patients home health PT referral order .]                 SDOH assessments and interventions completed:  No     Care Coordination Interventions:  Yes, provided   Follow up plan: Follow up call scheduled for as previously scheduled    Encounter Outcome:  Patient Visit Completed   .dg

## 2023-03-30 ENCOUNTER — Other Ambulatory Visit: Payer: Self-pay | Admitting: Cardiovascular Disease

## 2023-03-30 DIAGNOSIS — R002 Palpitations: Secondary | ICD-10-CM

## 2023-03-30 DIAGNOSIS — E1159 Type 2 diabetes mellitus with other circulatory complications: Secondary | ICD-10-CM

## 2023-03-31 ENCOUNTER — Encounter: Payer: Self-pay | Admitting: Family Medicine

## 2023-03-31 DIAGNOSIS — Z753 Unavailability and inaccessibility of health-care facilities: Secondary | ICD-10-CM | POA: Insufficient documentation

## 2023-03-31 DIAGNOSIS — Z5982 Transportation insecurity: Secondary | ICD-10-CM | POA: Insufficient documentation

## 2023-03-31 NOTE — Assessment & Plan Note (Signed)
Patient has upcoming appointments with multiple specialists but is unable to leave home due to mobility issues. Concerns about maintaining care if unable to attend appointments. -Explore options for home phlebotomy services. -Encourage patient to contact specialists' offices to discuss options, including telehealth visits.

## 2023-03-31 NOTE — Assessment & Plan Note (Signed)
Patient reports high blood sugars, possibly related to dietary choices. -Encourage patient to monitor blood sugars and adhere to a diabetic-friendly diet. -Follows with Endocrinology and Nephrology

## 2023-03-31 NOTE — Patient Instructions (Addendum)
It was a pleasure meeting you today. Thank you for allowing me to take part in your health care.  Our goals for today as we discussed include:  Referral placed to home health for physical therapy, home aide, occupational therapy and requested lab services.  Follow up with Oncology, Cardiology, Nephrology and Endocrinology.  Follow up with PCP as needed   This is a list of the screening recommended for you and due dates:  Health Maintenance  Topic Date Due   DTaP/Tdap/Td vaccine (1 - Tdap) Never done   Yearly kidney health urinalysis for diabetes  04/19/2022   Complete foot exam   11/25/2022   Flu Shot  01/17/2023   COVID-19 Vaccine (5 - 2023-24 season) 02/17/2023   Hemoglobin A1C  04/24/2023   Medicare Annual Wellness Visit  11/29/2023   Eye exam for diabetics  03/04/2024   Yearly kidney function blood test for diabetes  03/20/2024   Pneumonia Vaccine  Completed   Zoster (Shingles) Vaccine  Completed   HPV Vaccine  Aged Out   DEXA scan (bone density measurement)  Discontinued    If you have any questions or concerns, please do not hesitate to call the office at [phone].  I look forward to our next visit and until then take care and stay safe.  Regards,   [name], MD   [facility]

## 2023-03-31 NOTE — Assessment & Plan Note (Signed)
Recently treated with antibiotics, with one day of treatment remaining. No current urinary symptoms reported. -Complete current course of antibiotics.

## 2023-03-31 NOTE — Assessment & Plan Note (Signed)
Mobility and Fall Risk Recent fall at home with difficulty getting up. No acute injury reported. Patient reports weakness and balance issues, particularly with left leg. Unable to step up onto concrete platform outside home. No imaging done at recent ED visit. -Referral to [facility] for evaluation of home care services, including physical and occupational therapy. -Consider imaging of hip and lumbar spine if patient can be transported safely to clinic.

## 2023-03-31 NOTE — Assessment & Plan Note (Signed)
Contributing to increase mobility and fall risk. Recent fall at home with difficulty getting up. No acute injury reported. Patient reports weakness and balance issues, particularly with left leg. Unable to step up onto concrete platform outside home. -Referral to [facility] for evaluation of home care services, including physical and occupational therapy.

## 2023-04-01 ENCOUNTER — Emergency Department: Payer: Medicare Other

## 2023-04-01 ENCOUNTER — Inpatient Hospital Stay (HOSPITAL_COMMUNITY)
Admit: 2023-04-01 | Discharge: 2023-04-01 | Disposition: A | Discharge status: Home / Self Care | Payer: Medicare Other | Attending: Family Medicine | Admitting: Family Medicine

## 2023-04-01 ENCOUNTER — Other Ambulatory Visit: Payer: Self-pay

## 2023-04-01 ENCOUNTER — Inpatient Hospital Stay: Payer: Medicare Other

## 2023-04-01 ENCOUNTER — Observation Stay
Admission: EM | Admit: 2023-04-01 | Discharge: 2023-04-02 | Disposition: A | Discharge status: Skilled Nursing Facility | Payer: Medicare Other | Attending: Family Medicine | Admitting: Family Medicine

## 2023-04-01 DIAGNOSIS — I5033 Acute on chronic diastolic (congestive) heart failure: Secondary | ICD-10-CM

## 2023-04-01 DIAGNOSIS — N183 Chronic kidney disease, stage 3 unspecified: Secondary | ICD-10-CM

## 2023-04-01 DIAGNOSIS — L03115 Cellulitis of right lower limb: Secondary | ICD-10-CM

## 2023-04-01 DIAGNOSIS — E1129 Type 2 diabetes mellitus with other diabetic kidney complication: Secondary | ICD-10-CM | POA: Diagnosis present

## 2023-04-01 DIAGNOSIS — Z7982 Long term (current) use of aspirin: Secondary | ICD-10-CM | POA: Insufficient documentation

## 2023-04-01 DIAGNOSIS — R262 Difficulty in walking, not elsewhere classified: Secondary | ICD-10-CM

## 2023-04-01 DIAGNOSIS — L03116 Cellulitis of left lower limb: Secondary | ICD-10-CM | POA: Diagnosis present

## 2023-04-01 DIAGNOSIS — E871 Hypo-osmolality and hyponatremia: Secondary | ICD-10-CM | POA: Diagnosis not present

## 2023-04-01 DIAGNOSIS — I130 Hypertensive heart and chronic kidney disease with heart failure and stage 1 through stage 4 chronic kidney disease, or unspecified chronic kidney disease: Secondary | ICD-10-CM | POA: Insufficient documentation

## 2023-04-01 DIAGNOSIS — R531 Weakness: Secondary | ICD-10-CM | POA: Insufficient documentation

## 2023-04-01 DIAGNOSIS — R29898 Other symptoms and signs involving the musculoskeletal system: Secondary | ICD-10-CM

## 2023-04-01 DIAGNOSIS — N1832 Chronic kidney disease, stage 3b: Secondary | ICD-10-CM | POA: Diagnosis not present

## 2023-04-01 DIAGNOSIS — Z79899 Other long term (current) drug therapy: Secondary | ICD-10-CM | POA: Insufficient documentation

## 2023-04-01 DIAGNOSIS — E1122 Type 2 diabetes mellitus with diabetic chronic kidney disease: Secondary | ICD-10-CM | POA: Diagnosis not present

## 2023-04-01 DIAGNOSIS — Z794 Long term (current) use of insulin: Secondary | ICD-10-CM | POA: Insufficient documentation

## 2023-04-01 DIAGNOSIS — E1169 Type 2 diabetes mellitus with other specified complication: Secondary | ICD-10-CM | POA: Diagnosis not present

## 2023-04-01 DIAGNOSIS — R2689 Other abnormalities of gait and mobility: Secondary | ICD-10-CM | POA: Diagnosis not present

## 2023-04-01 DIAGNOSIS — E785 Hyperlipidemia, unspecified: Secondary | ICD-10-CM | POA: Diagnosis present

## 2023-04-01 DIAGNOSIS — W19XXXA Unspecified fall, initial encounter: Secondary | ICD-10-CM

## 2023-04-01 DIAGNOSIS — M79604 Pain in right leg: Secondary | ICD-10-CM

## 2023-04-01 DIAGNOSIS — L0390 Cellulitis, unspecified: Secondary | ICD-10-CM | POA: Insufficient documentation

## 2023-04-01 DIAGNOSIS — M79661 Pain in right lower leg: Secondary | ICD-10-CM | POA: Diagnosis present

## 2023-04-01 DIAGNOSIS — E782 Mixed hyperlipidemia: Secondary | ICD-10-CM | POA: Diagnosis present

## 2023-04-01 DIAGNOSIS — I5031 Acute diastolic (congestive) heart failure: Secondary | ICD-10-CM

## 2023-04-01 DIAGNOSIS — E118 Type 2 diabetes mellitus with unspecified complications: Secondary | ICD-10-CM

## 2023-04-01 HISTORY — DX: Acute on chronic diastolic (congestive) heart failure: I50.33

## 2023-04-01 LAB — ECHOCARDIOGRAM COMPLETE
AR max vel: 2.64 cm2
AV Area VTI: 2.88 cm2
AV Area mean vel: 2.43 cm2
AV Mean grad: 3 mm[Hg]
AV Peak grad: 5.5 mm[Hg]
Ao pk vel: 1.17 m/s
Area-P 1/2: 3.1 cm2
Height: 63 in
MV VTI: 2.43 cm2
S' Lateral: 3.1 cm
Weight: 4021.19 [oz_av]

## 2023-04-01 LAB — CBC WITH DIFFERENTIAL/PLATELET
Abs Immature Granulocytes: 0.02 10*3/uL (ref 0.00–0.07)
Basophils Absolute: 0.1 10*3/uL (ref 0.0–0.1)
Basophils Relative: 1 %
Eosinophils Absolute: 0.2 10*3/uL (ref 0.0–0.5)
Eosinophils Relative: 2 %
HCT: 35.3 % — ABNORMAL LOW (ref 36.0–46.0)
Hemoglobin: 11.6 g/dL — ABNORMAL LOW (ref 12.0–15.0)
Immature Granulocytes: 0 %
Lymphocytes Relative: 54 %
Lymphs Abs: 3.7 10*3/uL (ref 0.7–4.0)
MCH: 28.9 pg (ref 26.0–34.0)
MCHC: 32.9 g/dL (ref 30.0–36.0)
MCV: 87.8 fL (ref 80.0–100.0)
Monocytes Absolute: 0.7 10*3/uL (ref 0.1–1.0)
Monocytes Relative: 10 %
Neutro Abs: 2.3 10*3/uL (ref 1.7–7.7)
Neutrophils Relative %: 33 %
Platelets: 263 10*3/uL (ref 150–400)
RBC: 4.02 MIL/uL (ref 3.87–5.11)
RDW: 12.8 % (ref 11.5–15.5)
WBC: 7 10*3/uL (ref 4.0–10.5)
nRBC: 0 % (ref 0.0–0.2)

## 2023-04-01 LAB — BASIC METABOLIC PANEL
Anion gap: 8 (ref 5–15)
BUN: 28 mg/dL — ABNORMAL HIGH (ref 8–23)
CO2: 27 mmol/L (ref 22–32)
Calcium: 9.5 mg/dL (ref 8.9–10.3)
Chloride: 88 mmol/L — ABNORMAL LOW (ref 98–111)
Creatinine, Ser: 1.56 mg/dL — ABNORMAL HIGH (ref 0.44–1.00)
GFR, Estimated: 33 mL/min — ABNORMAL LOW (ref >60)
Glucose, Bld: 244 mg/dL — ABNORMAL HIGH (ref 70–99)
Potassium: 4.6 mmol/L (ref 3.5–5.1)
Sodium: 123 mmol/L — ABNORMAL LOW (ref 135–145)

## 2023-04-01 LAB — SODIUM
Sodium: 127 mmol/L — ABNORMAL LOW (ref 135–145)
Sodium: 129 mmol/L — ABNORMAL LOW (ref 135–145)

## 2023-04-01 LAB — BRAIN NATRIURETIC PEPTIDE: B Natriuretic Peptide: 132.9 pg/mL — ABNORMAL HIGH (ref 0.0–100.0)

## 2023-04-01 LAB — GLUCOSE, CAPILLARY: Glucose-Capillary: 244 mg/dL — ABNORMAL HIGH (ref 70–99)

## 2023-04-01 LAB — CBG MONITORING, ED: Glucose-Capillary: 323 mg/dL — ABNORMAL HIGH (ref 70–99)

## 2023-04-01 MED ORDER — MELATONIN 5 MG PO TABS
2.5000 mg | ORAL_TABLET | Freq: Every day | ORAL | Status: DC
  Administered 2023-04-01: 2.5 mg via ORAL
  Filled 2023-04-01: qty 1

## 2023-04-01 MED ORDER — SODIUM CHLORIDE 0.9 % IV SOLN
2.0000 g | INTRAVENOUS | Status: DC
  Administered 2023-04-01 – 2023-04-02 (×2): 2 g via INTRAVENOUS
  Filled 2023-04-01 (×2): qty 20

## 2023-04-01 MED ORDER — ENOXAPARIN SODIUM 60 MG/0.6ML IJ SOSY
55.0000 mg | PREFILLED_SYRINGE | INTRAMUSCULAR | Status: DC
  Administered 2023-04-01: 55 mg via SUBCUTANEOUS
  Filled 2023-04-01: qty 0.6

## 2023-04-01 MED ORDER — ASPIRIN 81 MG PO TBEC
81.0000 mg | DELAYED_RELEASE_TABLET | ORAL | Status: DC
  Administered 2023-04-01: 81 mg via ORAL
  Filled 2023-04-01: qty 1

## 2023-04-01 MED ORDER — METRONIDAZOLE 500 MG/100ML IV SOLN
500.0000 mg | Freq: Two times a day (BID) | INTRAVENOUS | Status: DC
  Administered 2023-04-01 – 2023-04-02 (×2): 500 mg via INTRAVENOUS
  Filled 2023-04-01 (×3): qty 100

## 2023-04-01 MED ORDER — TORSEMIDE 20 MG PO TABS: 20.0000 mg | ORAL_TABLET | Freq: Every day | ORAL | Status: DC

## 2023-04-01 MED ORDER — HYDROCODONE-ACETAMINOPHEN 5-325 MG PO TABS
1.0000 | ORAL_TABLET | ORAL | Status: DC | PRN
  Administered 2023-04-01 – 2023-04-02 (×3): 1 via ORAL
  Filled 2023-04-01 (×4): qty 1

## 2023-04-01 MED ORDER — FUROSEMIDE 10 MG/ML IJ SOLN
40.0000 mg | Freq: Once | INTRAMUSCULAR | Status: AC
  Administered 2023-04-01: 40 mg via INTRAVENOUS
  Filled 2023-04-01: qty 4

## 2023-04-01 MED ORDER — LORAZEPAM 0.5 MG PO TABS
0.5000 mg | ORAL_TABLET | Freq: Every day | ORAL | Status: DC | PRN
  Administered 2023-04-01: 0.5 mg via ORAL
  Filled 2023-04-01: qty 1

## 2023-04-01 MED ORDER — MORPHINE SULFATE (PF) 2 MG/ML IV SOLN
2.0000 mg | INTRAVENOUS | Status: DC | PRN
  Filled 2023-04-01: qty 1

## 2023-04-01 MED ORDER — IRBESARTAN 150 MG PO TABS
150.0000 mg | ORAL_TABLET | Freq: Every day | ORAL | Status: DC
  Administered 2023-04-01 – 2023-04-02 (×2): 150 mg via ORAL
  Filled 2023-04-01 (×3): qty 1

## 2023-04-01 MED ORDER — MELATONIN 3 MG PO TABS
3.0000 mg | ORAL_TABLET | Freq: Every day | ORAL | Status: DC
  Filled 2023-04-01: qty 1

## 2023-04-01 MED ORDER — HYDROCODONE-ACETAMINOPHEN 5-325 MG PO TABS
1.0000 | ORAL_TABLET | Freq: Once | ORAL | Status: AC
  Administered 2023-04-01: 1 via ORAL
  Filled 2023-04-01: qty 1

## 2023-04-01 MED ORDER — SODIUM CHLORIDE 0.45 % IV SOLN: INTRAVENOUS | Status: AC

## 2023-04-01 MED ORDER — SODIUM CHLORIDE 0.9 % IV BOLUS: 1000.0000 mL | Freq: Once | INTRAVENOUS | Status: DC

## 2023-04-01 MED ORDER — INSULIN ASPART 100 UNIT/ML IJ SOLN
0.0000 [IU] | Freq: Three times a day (TID) | INTRAMUSCULAR | Status: DC
  Administered 2023-04-01: 11 [IU] via SUBCUTANEOUS
  Administered 2023-04-02: 5 [IU] via SUBCUTANEOUS
  Administered 2023-04-02: 8 [IU] via SUBCUTANEOUS
  Administered 2023-04-02: 3 [IU] via SUBCUTANEOUS
  Filled 2023-04-01 (×3): qty 1

## 2023-04-01 MED ORDER — INSULIN ASPART 100 UNIT/ML IJ SOLN
3.0000 [IU] | Freq: Three times a day (TID) | INTRAMUSCULAR | Status: DC
  Administered 2023-04-02 (×3): 3 [IU] via SUBCUTANEOUS
  Filled 2023-04-01 (×4): qty 1

## 2023-04-01 MED ORDER — INSULIN ASPART 100 UNIT/ML IJ SOLN
0.0000 [IU] | Freq: Every day | INTRAMUSCULAR | Status: DC
  Filled 2023-04-01: qty 1

## 2023-04-01 MED ORDER — TORSEMIDE 20 MG PO TABS
20.0000 mg | ORAL_TABLET | Freq: Every day | ORAL | Status: DC
  Administered 2023-04-02: 20 mg via ORAL
  Filled 2023-04-01: qty 1

## 2023-04-01 NOTE — ED Triage Notes (Addendum)
Pt to ED via EMS from home, pt reports bilateral leg weakness x3weeks. Pt got up this morning and fell into table, pt denies injury from fall. Pt states she is having difficulty walking due to leg swelling. Pt has hx Neuropathy. Denies shortness of breath or chest pain

## 2023-04-01 NOTE — Assessment & Plan Note (Signed)
SSI  A1C  

## 2023-04-01 NOTE — Assessment & Plan Note (Signed)
Cont statin

## 2023-04-01 NOTE — Progress Notes (Signed)
CROSS COVER NOTE  NAME: Alexis Farmer MRN: [id number] DOB : 08/11/1940    Concern as stated by nurse / staff   Pt just arrived on floor, last got Norco @about  1300 - she is in severe pain, yelling, crying. She has chronic pain to left side, no PRN's - anything fast acting we can do now? Thank you!!  15 mins AE Also having terrible anxiety, crying, was just asking me where the doors were and panicking. She takes LORazepam (ATIVAN) 1 MG tablet at home. TY!      Pertinent findings on chart review: H&P reviewed, admitted for hyponatremia, cellulitis but has history of chronic pain. Meds from home reviewed.  On lorazepam 0.5 daily as needed but not on chronic narcotics.  Assessment and  Interventions   Assessment:  Acute on chronic pain Anxiety  Plan: Will continue as needed Norco Resume home Ativan X

## 2023-04-01 NOTE — Assessment & Plan Note (Signed)
Acute on chronic lower extremity weakness in setting of acute on chronic HFpEF, hyponatremia, left lower extremity cellulitis Nonfocal neuroexam Morbid obesity is a confounding issue Will otherwise monitor for now Consider further imaging if symptoms worsen despite treatment PT OT evaluation Monitor

## 2023-04-01 NOTE — ED Provider Notes (Signed)
[facility] Provider Note   Event Date/Time   First MD Initiated Contact with Patient 04/01/23 0940     (approximate) History  Leg Pain  HPI Alexis Farmer is a 82 y.o. female with a past medical history of hypertension, GI bleeding, morbid obesity, and type 2 diabetes who presents complaining of of bilateral leg weakness that been worsening over the last 3 weeks.  Patient states that she got up this morning and fell into a table but denies any injury from this fall.  Patient states she has had difficulty walking due to leg swelling but denies any medication nonadherence.  Of note, patient just finished a course of Paxlovid for a coronavirus infection. ROS: Patient currently denies any vision changes, tinnitus, difficulty speaking, facial droop, sore throat, chest pain, shortness of breath, abdominal pain, nausea/vomiting/diarrhea, dysuria, or weakness/numbness/paresthesias in any extremity   Physical Exam  Triage Vital Signs: ED Triage Vitals  Encounter Vitals Group     BP 04/01/23 0654 (!) 181/90     Systolic BP Percentile --      Diastolic BP Percentile --      Pulse Rate 04/01/23 0654 70     Resp 04/01/23 0654 18     Temp 04/01/23 0654 98.2 F (36.8 C)     Temp Source 04/01/23 0654 Oral     SpO2 04/01/23 0654 100 %     Weight 04/01/23 0655 251 lb 5.2 oz (114 kg)     Height 04/01/23 0655 5\' 3"  (1.6 m)     Head Circumference --      Peak Flow --      Pain Score 04/01/23 0654 10     Pain Loc --      Pain Education --      Exclude from Growth Chart --    Most recent vital signs: Vitals:   04/01/23 1236 04/01/23 1330  BP: (!) 147/74 (!) 157/59  Pulse: 63 64  Resp: 17 19  Temp:    SpO2: 98% 96%   General: Awake, oriented x4. CV:  Good peripheral perfusion.  Resp:  Normal effort.  Abd:  No distention.  Other:  Elderly overweight African-American female resting comfortably in no acute distress.  Edema and erythema over bilateral lower  extremities ED Results / Procedures / Treatments  Labs (all labs ordered are listed, but only abnormal results are displayed) Labs Reviewed  CBC WITH DIFFERENTIAL/PLATELET - Abnormal; Notable for the following components:      Result Value   Hemoglobin 11.6 (*)    HCT 35.3 (*)    All other components within normal limits  BASIC METABOLIC PANEL - Abnormal; Notable for the following components:   Sodium 123 (*)    Chloride 88 (*)    Glucose, Bld 244 (*)    BUN 28 (*)    Creatinine, Ser 1.56 (*)    GFR, Estimated 33 (*)    All other components within normal limits  BRAIN NATRIURETIC PEPTIDE - Abnormal; Notable for the following components:   B Natriuretic Peptide 132.9 (*)    All other components within normal limits  SODIUM - Abnormal; Notable for the following components:   Sodium 127 (*)    All other components within normal limits  CULTURE, BLOOD (ROUTINE X 2)  CULTURE, BLOOD (ROUTINE X 2)  SODIUM  SODIUM  SODIUM   RADIOLOGY ED MD interpretation: Single view portable chest x-ray shows enlarged heart with vascular congestion  X-ray of bilateral hip and pelvis  show no evidence of acute abnormalities -Agree with radiology assessment Official radiology report(s): ECHOCARDIOGRAM COMPLETE  Result Date: 04/01/2023    ECHOCARDIOGRAM REPORT   Patient Name:   Alexis Farmer Date of Exam: 04/01/2023 Medical Rec #:  [id number]        Height:       63.0 in Accession #:    [id number]       Weight:       251.3 lb Date of Birth:  Oct 19, 1940        BSA:          2.131 m Patient Age:    82 years         BP:           147/74 mmHg Patient Gender: F                HR:           63 bpm. Exam Location:  [facility] Procedure: 2D Echo, Cardiac Doppler and Color Doppler Indications:     CHF-acute diastolic I50.31  History:         Patient has prior history of Echocardiogram examinations, most                  recent 12/15/2020. Risk Factors:Hypertension and Diabetes.  Sonographer:     Cristela Blue Referring  Phys:  [id number] Francoise Schaumann [place] Diagnosing Phys: Lorine Bears MD  Sonographer Comments: Suboptimal apical window. IMPRESSIONS  1. Left ventricular ejection fraction, by estimation, is 60 to 65%. The left ventricle has normal function. The left ventricle has no regional wall motion abnormalities. There is moderate left ventricular hypertrophy. Left ventricular diastolic parameters are consistent with Grade I diastolic dysfunction (impaired relaxation).  2. Right ventricular systolic function is normal. The right ventricular size is normal. Tricuspid regurgitation signal is inadequate for assessing PA pressure.  3. Left atrial size was mildly dilated.  4. The mitral valve is normal in structure. No evidence of mitral valve regurgitation. No evidence of mitral stenosis.  5. The aortic valve is normal in structure. Aortic valve regurgitation is not visualized. No aortic stenosis is present. FINDINGS  Left Ventricle: Left ventricular ejection fraction, by estimation, is 60 to 65%. The left ventricle has normal function. The left ventricle has no regional wall motion abnormalities. The left ventricular internal cavity size was normal in size. There is  moderate left ventricular hypertrophy. Left ventricular diastolic parameters are consistent with Grade I diastolic dysfunction (impaired relaxation). Right Ventricle: The right ventricular size is normal. No increase in right ventricular wall thickness. Right ventricular systolic function is normal. Tricuspid regurgitation signal is inadequate for assessing PA pressure. Left Atrium: Left atrial size was mildly dilated. Right Atrium: Right atrial size was normal in size. Pericardium: There is no evidence of pericardial effusion. Mitral Valve: The mitral valve is normal in structure. No evidence of mitral valve regurgitation. No evidence of mitral valve stenosis. MV peak gradient, 5.2 mmHg. The mean mitral valve gradient is 3.0 mmHg. Tricuspid Valve: The tricuspid valve is  normal in structure. Tricuspid valve regurgitation is not demonstrated. No evidence of tricuspid stenosis. Aortic Valve: The aortic valve is normal in structure. Aortic valve regurgitation is not visualized. No aortic stenosis is present. Aortic valve mean gradient measures 3.0 mmHg. Aortic valve peak gradient measures 5.5 mmHg. Aortic valve area, by VTI measures 2.88 cm. Pulmonic Valve: The pulmonic valve was normal in structure. Pulmonic valve regurgitation is mild. No evidence of pulmonic stenosis. Aorta: The aortic  root is normal in size and structure. Venous: The inferior vena cava was not well visualized. IAS/Shunts: No atrial level shunt detected by color flow Doppler.  LEFT VENTRICLE PLAX 2D LVIDd:         4.60 cm   Diastology LVIDs:         3.10 cm   LV e' medial:    4.57 cm/s LV PW:         1.30 cm   LV E/e' medial:  15.1 LV IVS:        1.80 cm   LV e' lateral:   4.57 cm/s LVOT diam:     2.20 cm   LV E/e' lateral: 15.1 LV SV:         83 LV SV Index:   39 LVOT Area:     3.80 cm  LEFT ATRIUM             Index        RIGHT ATRIUM           Index LA diam:        4.00 cm 1.88 cm/m   RA Area:     15.60 cm LA Vol (A2C):   34.4 ml 16.14 ml/m  RA Volume:   39.00 ml  18.30 ml/m LA Vol (A4C):   51.2 ml 24.03 ml/m LA Biplane Vol: 43.3 ml 20.32 ml/m  AORTIC VALVE AV Area (Vmax):    2.64 cm AV Area (Vmean):   2.43 cm AV Area (VTI):     2.88 cm AV Vmax:           117.00 cm/s AV Vmean:          85.100 cm/s AV VTI:            0.289 m AV Peak Grad:      5.5 mmHg AV Mean Grad:      3.0 mmHg LVOT Vmax:         81.40 cm/s LVOT Vmean:        54.400 cm/s LVOT VTI:          0.219 m LVOT/AV VTI ratio: 0.76  AORTA Ao Root diam: 2.80 cm MITRAL VALVE                TRICUSPID VALVE MV Area (PHT): 3.10 cm     TR Peak grad:   18.5 mmHg MV Area VTI:   2.43 cm     TR Vmax:        215.00 cm/s MV Peak grad:  5.2 mmHg MV Mean grad:  3.0 mmHg     SHUNTS MV Vmax:       1.14 m/s     Systemic VTI:  0.22 m MV Vmean:      75.0 cm/s     Systemic Diam: 2.20 cm MV Decel Time: 245 msec MV E velocity: 69.20 cm/s MV A velocity: 115.00 cm/s MV E/A ratio:  0.60 Lorine Bears MD Electronically signed by Lorine Bears MD Signature Date/Time: 04/01/2023/3:01:14 PM    Final    DG Chest Port 1 View  Result Date: 04/01/2023 CLINICAL DATA:  Shortness of breath EXAM: PORTABLE CHEST 1 VIEW COMPARISON:  X-ray 11/27/2022 FINDINGS: Borderline cardiopericardial silhouette with some vascular congestion. Calcified aorta. No pneumothorax, effusion or edema. Left midlung scar or atelectasis. Film is under penetrated. IMPRESSION: Enlarged heart with some vascular congestion. Left midlung scar or atelectasis. Under penetrated radiograph Electronically Signed   By: Karen Kays M.D.   On: 04/01/2023  11:31   DG HIPS BILAT WITH PELVIS MIN 5 VIEWS  Result Date: 04/01/2023 CLINICAL DATA:  Leg weakness for 3 weeks.  Fall EXAM: DG HIP (WITH OR WITHOUT PELVIS) 5V BILAT COMPARISON:  None Available. FINDINGS: Hyperostosis. Concentric moderate joint space loss of the hips. There is also sclerosis and joint space loss of the sacroiliac joints and pubic symphysis. Osteopenia. No obvious fracture or dislocation. This has level of osteopenia, nondisplaced injury is difficult to completely exclude and if needed additional cross-sectional imaging study as clinically appropriate for further sensitivity. Diffuse vascular calcifications. IMPRESSION: Osteopenia with moderate degenerative changes and other chronic hyperostosis. Electronically Signed   By: Karen Kays M.D.   On: 04/01/2023 11:30   PROCEDURES: Critical Care performed: No Procedures MEDICATIONS ORDERED IN ED: Medications  enoxaparin (LOVENOX) injection 55 mg (has no administration in time range)  cefTRIAXone (ROCEPHIN) 2 g in sodium chloride 0.9 % 100 mL IVPB (2 g Intravenous New Bag/Given 04/01/23 1356)  torsemide (DEMADEX) tablet 20 mg (has no administration in time range)  aspirin EC tablet 81 mg (81 mg  Oral Given 04/01/23 1253)  irbesartan (AVAPRO) tablet 150 mg (150 mg Oral Given 04/01/23 1353)  insulin aspart (novoLOG) injection 0-15 Units (has no administration in time range)  insulin aspart (novoLOG) injection 0-5 Units (has no administration in time range)  insulin aspart (novoLOG) injection 3 Units (has no administration in time range)  metroNIDAZOLE (FLAGYL) IVPB 500 mg (0 mg Intravenous Stopped 04/01/23 1355)  furosemide (LASIX) injection 40 mg (40 mg Intravenous Given 04/01/23 1252)  HYDROcodone-acetaminophen (NORCO/VICODIN) 5-325 MG per tablet 1 tablet (1 tablet Oral Given 04/01/23 1253)   IMPRESSION / MDM / ASSESSMENT AND PLAN / ED COURSE  I reviewed the triage vital signs and the nursing notes.                             The patient is on the cardiac monitor to evaluate for evidence of arrhythmia and/or significant heart rate changes. Patient's presentation is most consistent with acute presentation with potential threat to life or bodily function. Patient found to be hyponatremic to 123. Patient mentating normally. Patient not hypovolemic so doubt extra renal losses such as GI losses, burns, 3rd spacing, or diuretic use. Labs are not consistent with adrenal insufficiency. 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 Patient not hypervolemic on exam with no history of CHF, cirrhosis, nephrotic syndrome Tx: 1L NS Given patient's significant weakness and recent fall coupled with hyponatremia, she will require admission to the internal medicine service for further evaluation and management. Dispo: Admit to medicine   FINAL CLINICAL IMPRESSION(S) / ED DIAGNOSES   Final diagnoses:  Hyponatremia  Cellulitis of right lower extremity  Ambulatory dysfunction   Rx / DC Orders   ED Discharge Orders     None      Note:  This document was prepared using Dragon voice recognition software and may include unintentional dictation errors.   Merwyn Katos, MD 04/01/23  [phone]

## 2023-04-01 NOTE — Assessment & Plan Note (Signed)
2D ECHO 11/2020 w/ EF 60-65%  + volume overload w/ acute on chronic hyponatremia on presentation  BNP 130s  CXR + cardiomegaly Will start pt on IV diuresis  Strict Is and Os and daily weights  2D ECHO  Cardiology consult as clinically indicated

## 2023-04-01 NOTE — Evaluation (Signed)
Occupational Therapy Evaluation Patient Details Name: Alexis Farmer MRN: [id number] DOB: 22-Sep-1940 Today's Date: 04/01/2023   History of Present Illness Alexis Farmer is a 82 y.o. female with medical history significant of morbid obesity, chronic HFpEF, stage II-III CKD, type 2 diabetes, chronic weakness, hypertension, hyperlipidemia presenting with lower extremity weakness, acute on chronic hyponatremia, acute on chronic HFpEF.  Fairly limited history as both patient as well as daughter are poor historians.  Per report, patient with worsening weakness over multiple days.  No fevers or chills.  No nausea or vomiting.  Patient noted to have been recently seen within the past 2 weeks in the ER for issues including COVID and UTI.  Has completed courses of Paxlovid as well as antibiotics.  Has had worsening with ambulation at home.   Clinical Impression   Pt was seen for OT evaluation this date. Prior to hospital admission, pt reports being independent/ Mod I w/ ADL's. Pt lives alone and reports to have family help on Saturdays only for IADL's. Pt also reported concerns with food insecurity. Pt uses RW at baseline. Pt presents to acute OT demonstrating impaired ADL performance and functional mobility 2/2 (See OT problem list for additional functional deficits). Upon arrival pt supine in bed, agreeable to OT/PT co-evaluation. Pt completed a supine <> sit t/f with Max A +2. Pt verbalized being in pain all over body, especially her left leg. Pt cried and yelled during t/f's due to pain. Pt pain was monitored during the session. Pt declined standing up. Pt has limited insight to deficits. Pt completed log rolling in bed with Max A +2. Pt left supine in bed with call bell within reach and all other needs met. Pt would benefit from skilled OT services to address noted impairments and functional limitations (see below for any additional details) in order to maximize safety and independence while minimizing  falls risk and caregiver burden. Anticipate the need for follow up OT services upon acute hospital DC.       If plan is discharge home, recommend the following: Two people to help with walking and/or transfers;Two people to help with bathing/dressing/bathroom;Assistance with feeding;Assist for transportation;Assistance with cooking/housework    Functional Status Assessment  Patient has had a recent decline in their functional status and demonstrates the ability to make significant improvements in function in a reasonable and predictable amount of time.  Equipment Recommendations  None recommended by OT    Recommendations for Other Services       Precautions / Restrictions Precautions Precautions: None Restrictions Weight Bearing Restrictions: No      Mobility Bed Mobility Overal bed mobility: Needs Assistance Bed Mobility: Supine to Sit     Supine to sit: Max assist, +2 for physical assistance          Transfers                          Balance Overall balance assessment: Needs assistance Sitting-balance support: Bilateral upper extremity supported Sitting balance-Leahy Scale: Good                                     ADL either performed or assessed with clinical judgement   ADL  Vision Baseline Vision/History: 1 Wears glasses       Perception         Praxis         Pertinent Vitals/Pain Pain Assessment Pain Assessment: Faces Faces Pain Scale: Hurts whole lot Pain Descriptors / Indicators: Aching, Discomfort, Grimacing, Shooting, Sore Pain Intervention(s): Limited activity within patient's tolerance, Monitored during session     Extremity/Trunk Assessment Upper Extremity Assessment Upper Extremity Assessment: Generalized weakness   Lower Extremity Assessment Lower Extremity Assessment: Generalized weakness       Communication  Communication Communication: No apparent difficulties   Cognition Arousal: Alert Behavior During Therapy: WFL for tasks assessed/performed                                         General Comments       Exercises     Shoulder Instructions      Home Living Family/patient expects to be discharged to:: Private residence Living Arrangements: Alone Available Help at Discharge: Family;Available PRN/intermittently Type of Home: Apartment Home Access: Stairs to enter Entrance Stairs-Number of Steps: 1 Entrance Stairs-Rails: None Home Layout: One level     Bathroom Shower/Tub: Sponge bathes at baseline;Tub/shower unit   Bathroom Toilet: Standard     Home Equipment: Agricultural consultant (2 wheels);Rollator (4 wheels);BSC/3in1;Adaptive equipment Adaptive Equipment: Sock aid        Prior Functioning/Environment Prior Level of Function : Independent/Modified Independent             Mobility Comments: Pt uses a RW at baseline. ADLs Comments: Familt assists with some IADL tasks as she no longer drives on saturdays. Pt endorses performing self care independently. Pt reports she has food insecurity.        OT Problem List: Decreased strength;Decreased coordination;Decreased range of motion;Decreased activity tolerance;Impaired balance (sitting and/or standing);Decreased safety awareness      OT Treatment/Interventions: Self-care/ADL training;Therapeutic exercise;Therapeutic activities;DME and/or AE instruction    OT Goals(Current goals can be found in the care plan section) Acute Rehab OT Goals Patient Stated Goal: to go home. OT Goal Formulation: With patient Time For Goal Achievement: 04/15/23 Potential to Achieve Goals: Fair  OT Frequency: Min 1X/week    Co-evaluation PT/OT/SLP Co-Evaluation/Treatment: Yes Reason for Co-Treatment: To address functional/ADL transfers   OT goals addressed during session: ADL's and self-care      AM-PAC OT "6 Clicks"  Daily Activity     Outcome Measure Help from another person eating meals?: A Little Help from another person taking care of personal grooming?: A Little Help from another person toileting, which includes using toliet, bedpan, or urinal?: A Lot Help from another person bathing (including washing, rinsing, drying)?: A Lot Help from another person to put on and taking off regular upper body clothing?: A Little Help from another person to put on and taking off regular lower body clothing?: A Lot 6 Click Score: 15   End of Session    Activity Tolerance: Patient limited by pain Patient left: in bed;with call bell/phone within reach;with bed alarm set  OT Visit Diagnosis: Muscle weakness (generalized) (M62.81);History of falling (Z91.81);Repeated falls (R29.6);Other abnormalities of gait and mobility (R26.89);Unsteadiness on feet (R26.81)                Time: 1191-4782 OT Time Calculation (min): 22 min Charges:  OT General Charges $OT Visit: 1 Visit OT Evaluation $OT Eval Moderate Complexity: 1 Mod  Kavir Savoca  Tedd Sias, SOT

## 2023-04-01 NOTE — Evaluation (Signed)
Physical Therapy Evaluation Patient Details Name: Alexis Farmer MRN: [id number] DOB: 1940-07-19 Today's Date: 04/01/2023  History of Present Illness  Alexis Farmer is a 82 y.o. female with medical history significant of morbid obesity, chronic HFpEF, stage II-III CKD, type 2 diabetes, chronic weakness, hypertension, hyperlipidemia presenting with lower extremity weakness, acute on chronic hyponatremia, acute on chronic HFpEF.  Fairly limited history as patient is poor historian.  Per report, patient with worsening weakness over multiple days.  No fevers or chills.  No nausea or vomiting.  Patient noted to have been recently seen within the past 2 weeks in the ER for issues including COVID and UTI.  Has completed courses of Paxlovid as well as antibiotics.  Has had worsening with ambulation at home. Current MD assessment: lower extremity weakness, acute on chronic hyponatremia, acute on chronic HFpEF, LLE cellulitis.  Clinical Impression  Pt was pleasant and willing to participate during the session and put forth fair effort throughout. Pt having notable pain with movement, ultimately limited session. Pt is currently Max A +2 for bed mobility, in addition to being very painful for the pt to perform. Pt able to tolerate ~3-4 minutes seated EOB before being laid back down. When up, pt declined further mobility due to her pain levels. Will assess mobility further in future sessions. Pt will benefit from continued PT services upon discharge to safely address deficits listed in patient problem list for decreased caregiver assistance and eventual return to PLOF.       If plan is discharge home, recommend the following: Assist for transportation;Help with stairs or ramp for entrance;Assistance with cooking/housework;Two people to help with bathing/dressing/bathroom;Two people to help with walking and/or transfers   Can travel by private vehicle        Equipment Recommendations Other (comment) (TBD  at next venue of care)  Recommendations for Other Services       Functional Status Assessment Patient has had a recent decline in their functional status and demonstrates the ability to make significant improvements in function in a reasonable and predictable amount of time.     Precautions / Restrictions Precautions Precautions: Fall Restrictions Weight Bearing Restrictions: No      Mobility  Bed Mobility Overal bed mobility: Needs Assistance Bed Mobility: Supine to Sit, Sit to Supine     Supine to sit: Max assist, +2 for physical assistance Sit to supine: +2 for physical assistance, Max assist   General bed mobility comments: Pt pain is severe with bd mobility, pt declined further mobility.    Transfers                   General transfer comment: pt declined    Ambulation/Gait                  Stairs            Wheelchair Mobility     Tilt Bed    Modified Rankin (Stroke Patients Only)       Balance Overall balance assessment: Needs assistance Sitting-balance support: Bilateral upper extremity supported Sitting balance-Leahy Scale: Good                                       Pertinent Vitals/Pain Pain Assessment Pain Assessment: 0-10 Pain Score: 10-Worst pain ever Pain Location: 'all over' Pain Descriptors / Indicators: Aching, Discomfort, Grimacing, Shooting, Sore Pain Intervention(s): Monitored during session, Limited activity  within patient's tolerance    Home Living Family/patient expects to be discharged to:: Private residence Living Arrangements: Alone Available Help at Discharge: Family;Available PRN/intermittently; Family only available 1 day a week Type of Home: Apartment Home Access: Stairs to enter Entrance Stairs-Rails: None Entrance Stairs-Number of Steps: 1   Home Layout: One level Home Equipment: Agricultural consultant (2 wheels);Rollator (4 wheels);BSC/3in1      Prior Function Prior Level of Function  : Independent/Modified Independent;History of Falls (last six months)             Mobility Comments: RW at baseline, household ambulator. reports falling x3 recently when attempting to get into her apt, reports she was unable to left her leg up to the step. ADLs Comments: Pt reports performing self care ind, family often assists with IADL tasks     Extremity/Trunk Assessment   Upper Extremity Assessment Upper Extremity Assessment: Generalized weakness    Lower Extremity Assessment Lower Extremity Assessment: Generalized weakness       Communication   Communication Communication: No apparent difficulties  Cognition Arousal: Alert Behavior During Therapy: WFL for tasks assessed/performed Overall Cognitive Status: Within Functional Limits for tasks assessed                                          General Comments      Exercises     Assessment/Plan    PT Assessment Patient needs continued PT services  PT Problem List Decreased strength;Decreased coordination;Decreased range of motion;Decreased activity tolerance;Decreased balance;Decreased mobility       PT Treatment Interventions DME instruction;Balance training;Gait training;Stair training;Functional mobility training;Therapeutic activities;Therapeutic exercise    PT Goals (Current goals can be found in the Care Plan section)  Acute Rehab PT Goals Patient Stated Goal: get home PT Goal Formulation: With patient Time For Goal Achievement: 04/14/23 Potential to Achieve Goals: Fair    Frequency Min 1X/week     Co-evaluation PT/OT/SLP Co-Evaluation/Treatment: Yes Reason for Co-Treatment: To address functional/ADL transfers PT goals addressed during session: Mobility/safety with mobility OT goals addressed during session: ADL's and self-care       AM-PAC PT "6 Clicks" Mobility  Outcome Measure Help needed turning from your back to your side while in a flat bed without using bedrails?: A  Lot Help needed moving from lying on your back to sitting on the side of a flat bed without using bedrails?: A Lot Help needed moving to and from a bed to a chair (including a wheelchair)?: Total Help needed standing up from a chair using your arms (e.g., wheelchair or bedside chair)?: Total Help needed to walk in hospital room?: Total Help needed climbing 3-5 steps with a railing? : Total 6 Click Score: 8    End of Session   Activity Tolerance: Patient limited by pain Patient left: in bed (in care of OT) Nurse Communication: Mobility status PT Visit Diagnosis: Other abnormalities of gait and mobility (R26.89);Repeated falls (R29.6);Muscle weakness (generalized) (M62.81);Difficulty in walking, not elsewhere classified (R26.2);Pain Pain - Right/Left: Left Pain - part of body: Leg (Left leg primarily, but pt reports pain 'all over' as well)    Time: 8295-6213 PT Time Calculation (min) (ACUTE ONLY): 17 min   Charges:                 Cecile Sheerer, SPT 04/01/23, 4:54 PM

## 2023-04-01 NOTE — Progress Notes (Signed)
   04/01/23 2007  Vitals  Temp 98.9 F (37.2 C)  BP (!) 194/55  MAP (mmHg) 97  BP Location Left Arm  BP Method Automatic  Patient Position (if appropriate) Lying  Pulse Rate (!) 57  Pulse Rate Source Monitor  Resp 18  MEWS COLOR  MEWS Score Color Green  Oxygen Therapy  SpO2 100 %  O2 Device Room Air  Pain Assessment  Pain Scale 0-10  Pain Score 10  Pain Type Acute pain  Pain Location Leg  Pain Orientation Left  Pain Descriptors / Indicators Constant  Pain Frequency Constant  Pain Onset On-going  Pain Intervention(s) MD notified (Comment)  MEWS Score  MEWS Temp 0  MEWS Systolic 0  MEWS Pulse 0  MEWS RR 0  MEWS LOC 0  MEWS Score 0  Provider Notification  Provider Name/Title H. Para March MD  Date Provider Notified 04/01/23  Time Provider Notified 2006  Method of Notification Page  Notification Reason Other (Comment) (Pain)  Provider response See new orders  Date of Provider Response 04/01/23  Time of Provider Response 2019

## 2023-04-01 NOTE — Assessment & Plan Note (Signed)
Baseline creatinine 1.2-1.9 with GFR of 20s to 40s Creatinine 1.56 with GFR in the 30s Monitor

## 2023-04-01 NOTE — Progress Notes (Signed)
*  PRELIMINARY RESULTS* Echocardiogram 2D Echocardiogram has been performed.  Cristela Blue 04/01/2023, 2:13 PM

## 2023-04-01 NOTE — Assessment & Plan Note (Signed)
Acute on chronic issue w/ Na 123- baseline Na in low 130s  Looks predominantly volume overloaded though history is somewhat unclear  BNP 130s  CXR w/ + cardiomegaly Will start gentle diuresis  Serial Na  Check urine and serum studies  Goal 6-8 mEq change over next 12-24 hours  Nephrology consult as clinically indicated

## 2023-04-01 NOTE — Assessment & Plan Note (Addendum)
Positive left lower extremity redness swelling and tenderness to palpation to right anterior shin concerning for cellulitis Will place on course of IV Rocephin, Flagyl for infectious coverage No leukocytosis  Blood cultures  De-escalate antibiotics as appropriate Monitor

## 2023-04-01 NOTE — ED Notes (Signed)
Pt is resting, awaiting transport to bring her up to floor

## 2023-04-01 NOTE — ED Notes (Signed)
Pt to xray

## 2023-04-01 NOTE — H&P (Signed)
History and Physical    Patient: Alexis Farmer ONG:[id number] DOB: 1940-10-12 DOA: 04/01/2023 DOS: the patient was seen and examined on 04/01/2023 PCP: [name], MD  Patient coming from: Home  Chief Complaint:  Chief Complaint  Patient presents with   Leg Pain   HPI: Alexis Farmer is a 82 y.o. female with medical history significant of morbid obesity, chronic HFpEF, stage II-III CKD, type 2 diabetes, chronic weakness, hypertension, hyperlipidemia presenting with lower extremity weakness, acute on chronic hyponatremia, acute on chronic HFpEF.  Fairly limited history as both patient as well as daughter are poor historians.  Per report, patient with worsening weakness over multiple days.  No fevers or chills.  No nausea or vomiting.  Patient noted to have been recently seen within the past 2 weeks in the ER for issues including COVID and UTI.  Has completed courses of Paxlovid as well as antibiotics.  Has had worsening with ambulation at home.  Has not been able to walk in over 2 years per the patient.  No chest pain.  Positive orthopnea.  No abdominal pain or diarrhea.  Attempted to use the toilet today but was unable to get up secondary to worsening pain also with worsening left lower extremity pain.  No reported trauma.  Positive reported fall with no head trauma loss consciousness.  Patient reports that she has not taken her diuretic in 2 weeks though she is not sure what medication she is taking every day. Presented to the ER afebrile, hemodynamically stable.  Satting well on room air.  White count 7, hemoglobin 9.6, platelets 263, creatinine 1.56, glucose 244.  Sodium 123.  Baseline sodium around 130-134.  BNP 134.  Hip plain films with osteopenia moderate degenerative changes.  Chest x-ray with vascular congestion. Review of Systems: As mentioned in the history of present illness. All other systems reviewed and are negative. Past Medical History:  Diagnosis Date   Anxiety 09/13/2015    Arthritis    Atypical chest pain 01/15/2021   Blackhead 10/24/2021   Bradycardia 07/17/2016   Formatting of this note might be different from the original.  Last Assessment & Plan:   Found to be bradycardic at outside hospital. They stopped metoprolol and verapamil and heart rate has been normal since then. Echo appears to have been reassuring at the outside facility. Appears to be doing much better with regards to this. She'll continue to monitor for recurrent symptoms. 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  Last Assessment & Plan:   Continues to have issues with this.  She is following with a specialist and it sounds as though they are planning on an MRI.  Benign exam today.   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 Loletha Carrow of this note might be different from the original.  Dr. Thedore Mins      Last Assessment & Plan:   Recheck kidney function today.   Constipation 11/17/2019   Cough 01/25/2016   Formatting of this note might be different from the original.  Last Assessment & Plan:   Minimal nighttime cough. Improves when she washes her pillows consistently. Suspect allergies contributing. She'll monitor.   Diabetes mellitus without complication (HCC)    one elevated reading/ no treatment   Disorder of rotator cuff 06/24/2018   Diverticulitis    Dysuria 03/22/2017   Formatting of this  note might be different from the original.  Last Assessment & Plan:   Symptoms concerning for UTI.  Will check urinalysis.   Edema of foot 04/08/2014   Formatting of this note might be different from the original.  Last Assessment & Plan:   Chronic pedal edema. Suspect venous insufficiency. No orthopnea or shortness of breath. Advised elevation of her legs. Consider compression stockings in the future.   Elevated troponin 12/15/2020   Essential hypertension 04/08/2014    Formatting of this note might be different from the original.  Last Assessment & Plan:   Well-controlled on recheck.  Continue current regimen.   Fall 03/26/2018   Gastroenteritis 08/12/2021   Gastrointestinal hemorrhage 08/03/2015   GI bleed    High cholesterol    History of blood transfusion    Hyperglycemia due to type 2 diabetes mellitus (HCC) 03/11/2015   Hypertension    Hypertensive urgency 12/15/2020   Low back pain 04/08/2014   Morbid obesity (HCC) 04/08/2014   Formatting of this note might be different from the original.  Last Assessment & Plan:   Weight is stable. Discussed diet and exercise at length. Encouraged whatever exercise she can do. Given diet information.   Palpitations 12/15/2020   Peripheral edema 12/22/2019   Postmenopausal bleeding 07/17/2016   Formatting of this note might be different from the original.  Last Assessment & Plan:   Recent D&C. Following with gynecology. Benign findings. Monitor for recurrence.   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  Last Assessment & Plan:   Continues to have issues with this.  She is following with a specialist and it sounds as though they are planning on an MRI.  Benign exam today.   Recurrent falls 08/13/2019   Renal insufficiency    Skin cyst 10/24/2021   Stage 3a chronic kidney disease (HCC) 01/15/2021   Swelling of lower leg 07/19/2016   Vertigo 10/13/2015   Formatting of this note might be different from the original.  Last Assessment & Plan:   No recurrence. Discussed that meclizine is an as needed medication and that she does not need to take this daily. She will continue to monitor.   Past Surgical History:  Procedure Laterality Date   APPENDECTOMY     CHOLECYSTECTOMY     ECTOPIC PREGNANCY SURGERY     EYE SURGERY     bilateral cataracts   EYE SURGERY      02/11/2019 repair hole in right eye    gallbladder      HYSTEROSCOPY WITH D & C N/A 10/26/2016   Procedure: DILATATION AND CURETTAGE /HYSTEROSCOPY;  Surgeon: Christeen Douglas, MD;  Location: [facility];  Service: Gynecology;  Laterality: N/A;   HYSTEROSCOPY WITH D & C N/A 07/07/2018   Procedure: DILATATION AND CURETTAGE /HYSTEROSCOPY;  Surgeon: Christeen Douglas, MD;  Location: [facility];  Service: Gynecology;  Laterality: N/A;   THYROIDECTOMY, PARTIAL     Social History:  reports that she has never smoked. She has never used smokeless tobacco. She reports that she does not drink alcohol and does not use drugs.  Allergies  Allergen Reactions   Celexa [Citalopram]     Diarrhea upset stomach    Jardiance [Empagliflozin] Other (See Comments)    Reaction not recalled   Norvasc [Amlodipine]     Leg edema   Tape Other (See Comments)    Leaves the  skin "raw" if left on for a period of time- tolerates paper tape   Penicillin V Rash   Penicillin V Potassium Rash   Penicillins Rash    Has patient had a PCN reaction causing immediate rash, facial/tongue/throat swelling, SOB or lightheadedness with hypotension: Yes Has patient had a PCN reaction causing severe rash involving mucus membranes or skin necrosis: No Has patient had a PCN reaction that required hospitalization No Has patient had a PCN reaction occurring within the last 10 years: Yes If all of the above answers are "NO", then may proceed with Cephalosporin use.     Family History  Problem Relation Age of Onset   Diabetes Mother    Hypertension Mother    Stroke Mother    Diabetes Other    Healthy Father    Diabetes Sister    Heart disease Sister     Prior to Admission medications   Medication Sig Start Date End Date Taking? Authorizing Provider  acetaminophen (TYLENOL) 500 MG tablet Take 500 mg by mouth every 6 (six) hours as needed for mild pain or headache.    [provider]  Ascorbic Acid (VITAMIN C PO) Take 1 tablet  by mouth daily.    [provider]  aspirin EC 81 MG tablet Take 81 mg by mouth 2 (two) times a week. Swallow whole.    [provider]  blood glucose meter kit and supplies KIT Accu chek, Dx code E11.65, check 3 times daily 07/26/17   Glori Luis, MD  Blood Pressure KIT 1 Device by Does not apply route daily. 10/24/21   McLean-Scocuzza, Pasty Spillers, MD  cephALEXin (KEFLEX) 500 MG capsule Take 1 capsule (500 mg total) by mouth 4 (four) times daily. 03/21/23   Elayne Snare K, DO  cloNIDine (CATAPRES) 0.2 MG tablet Take 1 tablet (0.2 mg total) by mouth 2 (two) times daily. Patient taking differently: Take 0.2 mg by mouth 3 (three) times daily. 12/18/21   Antonieta Iba, MD  glucose blood (ACCU-CHEK GUIDE) test strip USE AS INSTRUCTED 3 TIMES DAILY 09/06/22   [name], MD  hydrALAZINE (APRESOLINE) 100 MG tablet TAKE 1 TABLET (100 MG TOTAL) BY MOUTH 4 (FOUR) TIMES DAILY. 09/11/22   Gollan, Tollie Pizza, MD  insulin NPH Human (NOVOLIN N) 100 UNIT/ML injection INJECT 20 UNITS IN THE MORNING AND THE EVENING WITH A MEAL Patient taking differently: 40 Units 2 (two) times daily before a meal. 04/10/21   McLean-Scocuzza, Pasty Spillers, MD  insulin regular (NOVOLIN R) 100 units/mL injection Inject 0-0.08 mLs (0-8 Units total) into the skin 3 (three) times daily before meals. 10/17/20   Romero Belling, MD  Insulin Syringe-Needle U-100 (BD INSULIN SYRINGE U/F) 30G X 1/2" 0.5 ML MISC USE UP TO 5X PER DAY WITH INSULIN 2X (NPH) AND 3X (REGULAR) 12/18/22   [name], MD  LORazepam (ATIVAN) 1 MG tablet Take 0.5 tablets (0.5 mg total) by mouth daily as needed for anxiety. Inform pt 0.5 dose on backorder has to cut this in 1/2 dose 01/08/23   [name], MD  Multiple Vitamins-Minerals (CENTRUM SILVER 50+WOMEN) TABS Take 1 tablet by mouth daily with breakfast.    [provider]  nebivolol (BYSTOLIC) 10 MG tablet Take 1 tablet (10 mg total) by mouth daily. 07/10/22 01/06/23  Antonieta Iba, MD   nystatin (MYCOSTATIN/NYSTOP) powder Apply topically 2 (two) times daily. Under abdomen 02/04/23   [name], MD  ondansetron (ZOFRAN-ODT) 4 MG disintegrating tablet Take 1 tablet (4  mg total) by mouth every 8 (eight) hours as needed for nausea or vomiting. 11/27/22   Tegeler, Canary Brim, MD  polyethylene glycol powder (GLYCOLAX/MIRALAX) 17 GM/SCOOP powder Take 17 g by mouth daily as needed for moderate constipation or severe constipation. Can take up to 2x per day prn. Mix with 8 ounces of liquid 03/21/22   McLean-Scocuzza, Pasty Spillers, MD  rosuvastatin (CRESTOR) 20 MG tablet TAKE 1 TABLET BY MOUTH EVERY DAY 03/21/22   McLean-Scocuzza, Pasty Spillers, MD  torsemide (DEMADEX) 10 MG tablet Take 1 tablet (10 mg total) by mouth daily. 07/11/21   McLean-Scocuzza, Pasty Spillers, MD  valsartan (DIOVAN) 160 MG tablet TAKE 1 TABLET BY MOUTH EVERY DAY 01/18/23   Antonieta Iba, MD    Physical Exam: Vitals:   04/01/23 0654 04/01/23 0655 04/01/23 0945 04/01/23 1000  BP: (!) 181/90  (!) 154/89 (!) 179/74  Pulse: 70  71 (!) 59  Resp: 18     Temp: 98.2 F (36.8 C)     TempSrc: Oral     SpO2: 100%  100% 100%  Weight:  114 kg    Height:  5\' 3"  (1.6 m)     Physical Exam Constitutional:      Appearance: She is obese.  HENT:     Head: Normocephalic and atraumatic.     Nose: Nose normal.     Mouth/Throat:     Mouth: Mucous membranes are moist.  Eyes:     Pupils: Pupils are equal, round, and reactive to light.  Cardiovascular:     Rate and Rhythm: Normal rate and regular rhythm.  Pulmonary:     Effort: Pulmonary effort is normal.  Abdominal:     General: Bowel sounds are normal.     Data Reviewed:  There are no new results to review at this time.  DG Chest Port 1 View CLINICAL DATA:  Shortness of breath  EXAM: PORTABLE CHEST 1 VIEW  COMPARISON:  X-ray 11/27/2022  FINDINGS: Borderline cardiopericardial silhouette with some vascular congestion. Calcified aorta. No pneumothorax, effusion or  edema. Left midlung scar or atelectasis. Film is under penetrated.  IMPRESSION: Enlarged heart with some vascular congestion. Left midlung scar or atelectasis. Under penetrated radiograph  Electronically Signed   By: Karen Kays M.D.   On: 04/01/2023 11:31 DG HIPS BILAT WITH PELVIS MIN 5 VIEWS CLINICAL DATA:  Leg weakness for 3 weeks.  Fall  EXAM: DG HIP (WITH OR WITHOUT PELVIS) 5V BILAT  COMPARISON:  None Available.  FINDINGS: Hyperostosis. Concentric moderate joint space loss of the hips. There is also sclerosis and joint space loss of the sacroiliac joints and pubic symphysis. Osteopenia. No obvious fracture or dislocation. This has level of osteopenia, nondisplaced injury is difficult to completely exclude and if needed additional cross-sectional imaging study as clinically appropriate for further sensitivity. Diffuse vascular calcifications.  IMPRESSION: Osteopenia with moderate degenerative changes and other chronic hyperostosis.  Electronically Signed   By: Karen Kays M.D.   On: 04/01/2023 11:30  Lab Results  Component Value Date   WBC 7.0 04/01/2023   HGB 11.6 (L) 04/01/2023   HCT 35.3 (L) 04/01/2023   MCV 87.8 04/01/2023   PLT 263 04/01/2023   Last metabolic panel Lab Results  Component Value Date   GLUCOSE 244 (H) 04/01/2023   NA 123 (L) 04/01/2023   K 4.6 04/01/2023   CL 88 (L) 04/01/2023   CO2 27 04/01/2023   BUN 28 (H) 04/01/2023   CREATININE 1.56 (H) 04/01/2023  GFRNONAA 33 (L) 04/01/2023   CALCIUM 9.5 04/01/2023   PROT 6.8 11/27/2022   ALBUMIN 3.7 11/27/2022   LABGLOB 3.2 12/13/2021   AGRATIO 1.3 11/19/2018   BILITOT 0.7 11/27/2022   ALKPHOS 40 11/27/2022   AST 30 11/27/2022   ALT 23 11/27/2022   ANIONGAP 8 04/01/2023    Assessment and Plan: Hyponatremia Acute on chronic issue w/ Na 123- baseline Na in low 130s  Looks predominantly volume overloaded though history is somewhat unclear  BNP 130s  CXR w/ + cardiomegaly Will start  gentle diuresis  Serial Na  Check urine and serum studies  Goal 6-8 mEq change over next 12-24 hours  Nephrology consult as clinically indicated   Acute on chronic heart failure with preserved ejection fraction (HFpEF) (HCC) 2D ECHO 11/2020 w/ EF 60-65%  + volume overload w/ acute on chronic hyponatremia on presentation  BNP 130s  CXR + cardiomegaly Will start pt on IV diuresis  Strict Is and Os and daily weights  2D ECHO  Cardiology consult as clinically indicated    Lower extremity weakness Acute on chronic lower extremity weakness in setting of acute on chronic HFpEF, hyponatremia, left lower extremity cellulitis Nonfocal neuroexam Morbid obesity is a confounding issue Will otherwise monitor for now Consider further imaging if symptoms worsen despite treatment PT OT evaluation Monitor  Cellulitis Positive left lower extremity redness swelling and tenderness to palpation to right anterior shin concerning for cellulitis Will place on course of IV Rocephin, Flagyl for infectious coverage No leukocytosis  Blood cultures  De-escalate antibiotics as appropriate Monitor  Stage 3b chronic kidney disease (HCC) Baseline creatinine 1.2-1.9 with GFR of 20s to 40s Creatinine 1.56 with GFR in the 30s Monitor  Type 2 diabetes mellitus with other diabetic kidney complication (HCC) SSI  A1C  Mixed hyperlipidemia Cont statin     Greater than 50% was spent in counseling and coordination of care with patient Total encounter time 80 minutes or more   Advance Care Planning:   Code Status: Full Code   Consults: None   Family Communication: Daughter at the bedside.  Discussed with daughter given overall progressive weakness at home, patient may benefit from placement versus dedicated home health.  Family somewhat receptive.  May benefit from formal goals of care discussion at some point in the future.  Severity of Illness: The appropriate patient status for this patient is  INPATIENT. Inpatient status is judged to be reasonable and necessary in order to provide the required intensity of service to ensure the patient's safety. The patient's presenting symptoms, physical exam findings, and initial radiographic and laboratory data in the context of their chronic comorbidities is felt to place them at high risk for further clinical deterioration. Furthermore, it is not anticipated that the patient will be medically stable for discharge from the hospital within 2 midnights of admission.   * I certify that at the point of admission it is my clinical judgment that the patient will require inpatient hospital care spanning beyond 2 midnights from the point of admission due to high intensity of service, high risk for further deterioration and high frequency of surveillance required.*  Author: Floydene Flock, MD 04/01/2023 12:12 PM  For on call review [email or web address].

## 2023-04-02 ENCOUNTER — Telehealth: Payer: Self-pay

## 2023-04-02 ENCOUNTER — Telehealth: Payer: Self-pay | Admitting: Family Medicine

## 2023-04-02 ENCOUNTER — Inpatient Hospital Stay: Payer: Medicare Other

## 2023-04-02 DIAGNOSIS — L03116 Cellulitis of left lower limb: Secondary | ICD-10-CM | POA: Diagnosis present

## 2023-04-02 DIAGNOSIS — E871 Hypo-osmolality and hyponatremia: Secondary | ICD-10-CM | POA: Diagnosis not present

## 2023-04-02 LAB — COMPREHENSIVE METABOLIC PANEL
ALT: 17 U/L (ref 0–44)
AST: 22 U/L (ref 15–41)
Albumin: 3.5 g/dL (ref 3.5–5.0)
Alkaline Phosphatase: 40 U/L (ref 38–126)
Anion gap: 11 (ref 5–15)
BUN: 25 mg/dL — ABNORMAL HIGH (ref 8–23)
CO2: 25 mmol/L (ref 22–32)
Calcium: 9.4 mg/dL (ref 8.9–10.3)
Chloride: 91 mmol/L — ABNORMAL LOW (ref 98–111)
Creatinine, Ser: 1.29 mg/dL — ABNORMAL HIGH (ref 0.44–1.00)
GFR, Estimated: 41 mL/min — ABNORMAL LOW (ref >60)
Glucose, Bld: 266 mg/dL — ABNORMAL HIGH (ref 70–99)
Potassium: 4 mmol/L (ref 3.5–5.1)
Sodium: 127 mmol/L — ABNORMAL LOW (ref 135–145)
Total Bilirubin: 0.9 mg/dL (ref 0.3–1.2)
Total Protein: 6.7 g/dL (ref 6.5–8.1)

## 2023-04-02 LAB — CBC
HCT: 34.8 % — ABNORMAL LOW (ref 36.0–46.0)
Hemoglobin: 11.7 g/dL — ABNORMAL LOW (ref 12.0–15.0)
MCH: 29.4 pg (ref 26.0–34.0)
MCHC: 33.6 g/dL (ref 30.0–36.0)
MCV: 87.4 fL (ref 80.0–100.0)
Platelets: 258 10*3/uL (ref 150–400)
RBC: 3.98 MIL/uL (ref 3.87–5.11)
RDW: 12.8 % (ref 11.5–15.5)
WBC: 8.1 10*3/uL (ref 4.0–10.5)
nRBC: 0 % (ref 0.0–0.2)

## 2023-04-02 LAB — GLUCOSE, CAPILLARY
Glucose-Capillary: 222 mg/dL — ABNORMAL HIGH (ref 70–99)
Glucose-Capillary: 299 mg/dL — ABNORMAL HIGH (ref 70–99)
Glucose-Capillary: 280 mg/dL — ABNORMAL HIGH (ref 70–99)
Glucose-Capillary: 219 mg/dL — ABNORMAL HIGH (ref 70–99)

## 2023-04-02 LAB — SODIUM: Sodium: 129 mmol/L — ABNORMAL LOW (ref 135–145)

## 2023-04-02 MED ORDER — ALUM & MAG HYDROXIDE-SIMETH 200-200-20 MG/5ML PO SUSP: 30.0000 mL | ORAL | Status: DC | PRN

## 2023-04-02 MED ORDER — SPIRONOLACTONE 25 MG PO TABS: 25.0000 mg | ORAL_TABLET | ORAL | Status: DC

## 2023-04-02 MED ORDER — INSULIN GLARGINE-YFGN 100 UNIT/ML ~~LOC~~ SOLN
25.0000 [IU] | Freq: Every day | SUBCUTANEOUS | Status: DC
  Administered 2023-04-02: 25 [IU] via SUBCUTANEOUS
  Filled 2023-04-02: qty 0.25

## 2023-04-02 MED ORDER — NEBIVOLOL HCL 10 MG PO TABS
10.0000 mg | ORAL_TABLET | Freq: Every day | ORAL | Status: DC
  Administered 2023-04-02: 10 mg via ORAL
  Filled 2023-04-02: qty 1

## 2023-04-02 MED ORDER — INFLUENZA VAC A&B SURF ANT ADJ 0.5 ML IM SUSY: 0.5000 mL | PREFILLED_SYRINGE | INTRAMUSCULAR | Status: DC

## 2023-04-02 MED ORDER — TORSEMIDE 20 MG PO TABS: 10.0000 mg | ORAL_TABLET | Freq: Every day | ORAL | Status: DC

## 2023-04-02 MED ORDER — HYDROCORTISONE 1 % EX CREA
1.0000 | TOPICAL_CREAM | Freq: Three times a day (TID) | CUTANEOUS | Status: DC | PRN
  Administered 2023-04-02: 1 via TOPICAL
  Filled 2023-04-02: qty 28

## 2023-04-02 MED ORDER — CEPHALEXIN 500 MG PO CAPS: 500.0000 mg | ORAL_CAPSULE | Freq: Three times a day (TID) | ORAL | Status: DC

## 2023-04-02 MED ORDER — ROSUVASTATIN CALCIUM 10 MG PO TABS
20.0000 mg | ORAL_TABLET | Freq: Every day | ORAL | Status: DC
  Administered 2023-04-02: 20 mg via ORAL
  Filled 2023-04-02: qty 2

## 2023-04-02 MED ORDER — HYDRALAZINE HCL 50 MG PO TABS
100.0000 mg | ORAL_TABLET | Freq: Four times a day (QID) | ORAL | Status: DC
  Administered 2023-04-02: 100 mg via ORAL
  Filled 2023-04-02: qty 2

## 2023-04-02 MED ORDER — HYDROCODONE-ACETAMINOPHEN 5-325 MG PO TABS: 1.0000 | ORAL_TABLET | ORAL | 0 refills | Status: DC | PRN

## 2023-04-02 MED ORDER — DIPHENHYDRAMINE HCL 50 MG/ML IJ SOLN
25.0000 mg | Freq: Four times a day (QID) | INTRAMUSCULAR | Status: DC | PRN
  Administered 2023-04-02: 25 mg via INTRAVENOUS
  Filled 2023-04-02: qty 1

## 2023-04-02 MED ORDER — INSULIN NPH (HUMAN) (ISOPHANE) 100 UNIT/ML ~~LOC~~ SUSP: 20.0000 [IU] | Freq: Two times a day (BID) | SUBCUTANEOUS | Status: DC

## 2023-04-02 MED ORDER — CLONIDINE HCL 0.1 MG PO TABS
0.2000 mg | ORAL_TABLET | Freq: Two times a day (BID) | ORAL | Status: DC
  Administered 2023-04-02: 0.2 mg via ORAL
  Filled 2023-04-02: qty 2

## 2023-04-02 NOTE — Inpatient Diabetes Management (Signed)
Inpatient Diabetes Program Recommendations  AACE/ADA: New Consensus Statement on Inpatient Glycemic Control  Target Ranges:  Prepandial:   less than 140 mg/dL      Peak postprandial:   less than 180 mg/dL (1-2 hours)      Critically ill patients:  140 - 180 mg/dL    Latest Reference Range & Units 04/01/23 18:02 04/01/23 20:10 04/02/23 03:07 04/02/23 07:45  Glucose-Capillary 70 - 99 mg/dL 295 (H) 621 (H) 308 (H) 299 (H)   Review of Glycemic Control  Diabetes history: DM2 Outpatient Diabetes medications: NPH 20 units BID, Regular 0-8 units TID with meals Current orders for Inpatient glycemic control: Novolog 0-15 units TID with meals, Novolog 0-5 units at bedtime, Novolog 3 units TID with meals  Inpatient Diabetes Program Recommendations:    Insulin: Please consider ordering Semglee 25 units Q24H (to start now).  NOTE: Patient admitted on 10/14 with hyponatremia, acute on chronic heart failure, lower extremity weakness, and cellulitis.  Per chart, patient sees Dr. Gershon Crane (Endocrinologist) and was last seen on 01/01/23. Per office note on 01/01/23 patient was asked to increase NPH to 25 units in the morning and 12 units at night; patient prescribed Regular 0-8 units TID with meals as well.  Thanks, Orlando Penner, RN, MSN, CDCES Diabetes Coordinator Inpatient Diabetes Program [phone] (Team Pager from 8am to 5pm)

## 2023-04-02 NOTE — [place] FL2 (Signed)
[place] MEDICAID FL2 LEVEL OF CARE FORM     IDENTIFICATION  Patient Name: Alexis Farmer Birthdate: 12/24/1940 Sex: female Admission Date (Current Location): 04/01/2023  [place] and [place] Number:  Chiropodist and Address:  [facility], [address] [place], [place] [address]      Provider Number: [id number]  Attending Physician Name and Address:  Jonah Blue, MD  Relative Name and Phone Number:  Marijo Conception (grandaughter) [phone]    Current Level of Care: Hospital Recommended Level of Care: Skilled Nursing Facility Prior Approval Number:    Date Approved/Denied:   PASRR Number: [id number]  Discharge Plan: SNF    Current Diagnoses: Patient Active Problem List   Diagnosis Date Noted   Hyponatremia 04/01/2023   Acute on chronic heart failure with preserved ejection fraction (HFpEF) (HCC) 04/01/2023   Cellulitis 04/01/2023   Lower extremity weakness 04/01/2023   Inability to access health care due to transportation insecurity 03/31/2023   Mood disorder (HCC) 01/20/2023   Vision loss, bilateral 10/22/2022   Pre-syncope 10/22/2022   Ceruminosis, right 09/07/2022   DM type 2, controlled, with complication (HCC) 08/14/2022   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/2022   Hallucinations, visual 07/20/2022   Need for pneumococcal vaccination 07/20/2022   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   Stage 3b chronic kidney disease (HCC) 03/21/2022   Ileus (HCC) 08/22/2021   Lymphedema 04/19/2021   Atherosclerosis of aorta (HCC) 01/16/2021   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/16/2021   Spinal stenosis of lumbar region 01/16/2021   Panic attack 01/15/2021   Other chronic pain 08/25/2020   Hypertension associated with diabetes (HCC) 08/25/2020   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) 05/30/2020   Retinopathy 05/30/2020    Multinodular goiter 02/04/2020   Proteinuria 12/22/2019   Physical deconditioning 08/13/2019   Abdominal aortic atherosclerosis (HCC) 07/24/2019   Morbid obesity with BMI of 45.0-49.9, adult (HCC) 06/09/2019   Hypercalcemia 03/18/2019   Round hole, unspecified eye 02/06/2019   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 08/13/2018   Abnormal gait 03/26/2018   Arthritis 03/26/2018   Thyroid nodule 01/20/2018   UTI (urinary tract infection) 09/24/2016   Rectal hemorrhage 07/29/2016   Carotid artery stenosis 07/19/2016   PAD (peripheral artery disease) (HCC) 07/19/2016   Chronic diastolic CHF (congestive heart failure) (HCC) 05/31/2016   Chronic fatigue 11/15/2015   Depression, recurrent (HCC) 10/13/2015   Osteoarthritis 10/13/2015   Mixed hyperlipidemia 08/26/2014   Uncontrolled type 2 diabetes mellitus with hyperglycemia (HCC) 04/08/2014   Stage 2 chronic kidney disease 04/08/2014   Diabetic polyneuropathy (HCC) 04/08/2014   Type 2 diabetes mellitus with other diabetic kidney complication (HCC) 04/08/2014    Orientation RESPIRATION BLADDER Height & Weight     Self, Time, Situation, Place  Normal Incontinent, External catheter Weight: 251 lb 5.2 oz (114 kg) Height:  5\' 3"  (160 cm)  BEHAVIORAL SYMPTOMS/MOOD NEUROLOGICAL BOWEL NUTRITION STATUS      Continent Diet (see discharge summary)  AMBULATORY STATUS COMMUNICATION OF NEEDS Skin   Extensive Assist Verbally Normal                       Personal Care Assistance Level of Assistance  Bathing, Feeding, Dressing, Total care Bathing Assistance: Maximum assistance Feeding assistance: Limited assistance Dressing Assistance: Maximum assistance Total Care Assistance: Maximum assistance   Functional Limitations Info  Sight, Hearing, Speech Sight Info: Adequate Hearing Info: Adequate Speech Info: Adequate    SPECIAL CARE FACTORS FREQUENCY   PT (By licensed PT), OT (By licensed OT)     PT Frequency: min 4x weekly OT Frequency: min 4x weekly            Contractures Contractures Info: Not present    Additional Factors Info  Code Status, Allergies Code Status Info: full Allergies Info: Celexa (Citalopram)  Jardiance (Empagliflozin)  Norvasc (Amlodipine)  Tape  Penicillin V  Penicillin V Potassium  Penicillins           Current Medications (04/02/2023):  This is the current hospital active medication list Current Facility-Administered Medications  Medication Dose Route Frequency Provider Last Rate Last Admin   alum & mag hydroxide-simeth (MAALOX/MYLANTA) 200-200-20 MG/5ML suspension 30 mL  30 mL Oral Q4H PRN Floydene Flock, MD       aspirin EC tablet 81 mg  81 mg Oral Once per day on Monday Thursday Floydene Flock, MD   81 mg at 04/01/23 1253   cefTRIAXone (ROCEPHIN) 2 g in sodium chloride 0.9 % 100 mL IVPB  2 g Intravenous Q24H Floydene Flock, MD   Stopped at 04/01/23 1420   cloNIDine (CATAPRES) tablet 0.2 mg  0.2 mg Oral BID Jonah Blue, MD       diphenhydrAMINE (BENADRYL) injection 25 mg  25 mg Intravenous Q6H PRN Andris Baumann, MD   25 mg at 04/02/23 0050   enoxaparin (LOVENOX) injection 55 mg  55 mg Subcutaneous Q24H Floydene Flock, MD   55 mg at 04/01/23 2036   hydrALAZINE (APRESOLINE) tablet 100 mg  100 mg Oral Q6H Jonah Blue, MD       HYDROcodone-acetaminophen (NORCO/VICODIN) 5-325 MG per tablet 1 tablet  1 tablet Oral Q4H PRN Andris Baumann, MD   1 tablet at 04/02/23 0531   hydrocortisone cream 1 % 1 Application  1 Application Topical TID PRN Floydene Flock, MD   1 Application at 04/02/23 0157   [START ON 04/03/2023] influenza vaccine adjuvanted (FLUAD) injection 0.5 mL  0.5 mL Intramuscular Tomorrow-1000 Floydene Flock, MD       insulin aspart (novoLOG) injection 0-15 Units  0-15 Units Subcutaneous TID WC Floydene Flock, MD   8 Units at 04/02/23 4010   insulin aspart (novoLOG)  injection 0-5 Units  0-5 Units Subcutaneous QHS Floydene Flock, MD       insulin aspart (novoLOG) injection 3 Units  3 Units Subcutaneous TID WC Floydene Flock, MD   3 Units at 04/02/23 0820   insulin glargine-yfgn (SEMGLEE) injection 25 Units  25 Units Subcutaneous Daily Jonah Blue, MD       irbesartan (AVAPRO) tablet 150 mg  150 mg Oral Daily Floydene Flock, MD   150 mg at 04/02/23 0816   LORazepam (ATIVAN) tablet 0.5 mg  0.5 mg Oral Daily PRN Andris Baumann, MD   0.5 mg at 04/01/23 2036   melatonin tablet 2.5 mg  2.5 mg Oral QHS Floydene Flock, MD   2.5 mg at 04/01/23 2241   metroNIDAZOLE (FLAGYL) IVPB 500 mg  500 mg Intravenous Q12H Floydene Flock, MD 100 mL/hr at 04/02/23 0054 500 mg at 04/02/23 0054   morphine (PF) 2 MG/ML injection 2 mg  2 mg Intravenous Q2H PRN Andris Baumann, MD       nebivolol (BYSTOLIC) tablet 10 mg  10 mg Oral Daily Jonah Blue, MD  rosuvastatin (CRESTOR) tablet 20 mg  20 mg Oral Daily Jonah Blue, MD       [START ON 04/03/2023] spironolactone (ALDACTONE) tablet 25 mg  25 mg Oral Once per day on Monday Wednesday Friday Jonah Blue, MD       Melene Muller ON 04/03/2023] torsemide [facility]) tablet 10 mg  10 mg Oral Daily Jonah Blue, MD         Discharge Medications: Please see discharge summary for a list of discharge medications.  Relevant Imaging Results:  Relevant Lab Results:   Additional Information SSN: [id number]  Darolyn Rua, LCSW

## 2023-04-02 NOTE — Patient Outreach (Signed)
Care Coordination   Follow Up Visit Note   04/02/2023 Name: Suly Schricker MRN: [id number] DOB: 07/30/1940  Trichelle Harvell is a 82 y.o. year old female who sees [name], MD for primary care. I spoke with  [name] by phone today.  What matters to the patients health and wellness today?  Patient states she is in the hospital due to left leg pain and low sodium level. She states she will be going to a skilled nursing facility for rehab when discharged from the hospital.       Goals Addressed             This Visit's Progress    Patient Stated:  " Getting my diabetes under control and manage heart failure and chronic health conditions"       Interventions Today    Flowsheet Row Most Recent Value  Chronic Disease   Chronic disease during today's visit Other  General Interventions   General Interventions Discussed/Reviewed General Interventions Reviewed  [Returned call to patient. Advised patient to notify RNCM when close to discharge at SNF for further care coordination assistance.]                 SDOH assessments and interventions completed:  No     Care Coordination Interventions:  Yes, provided   Follow up plan: Follow up call scheduled for to be determined when discharged from SNF    Encounter Outcome:  Patient Visit Completed   [name] RN,BSN,CCM [facility] Care Coordination [phone] direct line

## 2023-04-02 NOTE — Care Management CC44 (Signed)
Condition Code 44 Documentation Completed  Patient Details  Name: Alexis Farmer MRN: [id number] Date of Birth: 1940/06/27   Condition Code 44 given:  Yes Patient signature on Condition Code 44 notice:  Yes Documentation of 2 MD's agreement:  Yes Code 44 added to claim:  Yes    Darolyn Rua, LCSW 04/02/2023, 4:17 PM

## 2023-04-02 NOTE — Progress Notes (Signed)
EOS: Pt A&Ox4, strange affect. At times forgetful, mistrustful/paranoid. Poor historian, but interactive-ness waxes/wanes. Refused insulin, stated her CBG felt low and she needed juice - CBG 244 then 222. On call contacted at pt request for severe unrelieved acute pain to LLE & chronic back pain, crying, see new orders on eMAR - pt refused pain medication, stating that it didn't work, or that she thought she was allergic to it, or that she didn't believe it would work. Home dosage Ativan also added, see eMAR. Pt has history of hospitalizations for anxiety, hallucinations, panic attacks. Pt previously stated that she could not move, used front wheel walker and stand by assist to bathroom where pt independently voided. Pt previously lived w/ daughter & son in law but recently moved out to live by herself d/t incompatibility w/ son in law.

## 2023-04-02 NOTE — TOC Transition Note (Addendum)
Transition of Care [facility]) - CM/SW Discharge Note   Patient Details  Name: Alexis Farmer MRN: [id number] Date of Birth: 1940/07/14  [facility] CM/SW Contact:  Darolyn Rua, LCSW Phone Number: 04/02/2023, 4:18 PM   Clinical Narrative:     Patient will DC [facility] Anticipated DC date:04/02/23 Family notified: granddaughter [name] Transport BJ:[id number]  Per MD patient ready for [facility] . RN, patient, patient's family, and facility notified of DC. Discharge Summary sent to facility. RN given number for report    [phone] Room 606B. Gena at [facility] confirms patient can come today with [facility] waiver, CSW has emailed [facility] to inform. DC packet on chart. Ambulance transport requested for patient.  CSW signing off.     Final next level of care: Skilled Nursing Facility Barriers to Discharge: No Barriers Identified   Patient Goals and CMS Choice CMS Medicare.gov Compare Post Acute Care list provided to:: Patient Choice offered to / list presented to : Patient  Discharge Placement                [facility] bed at: [facility] [place] Patient to be transferred to facility by: acems Name of family member notified: granddaughter Patient and family notified of of transfer: 04/02/23  Discharge Plan and Services Additional resources added to the After Visit Summary for                                       Social Determinants of Health (SDOH) Interventions SDOH Screenings   Food Insecurity: No Food Insecurity (11/29/2022)  Housing: Low Risk  (11/29/2022)  Transportation Needs: Unmet Transportation Needs (11/29/2022)  Utilities: Not At Risk (11/29/2022)  Alcohol Screen: Low Risk  (11/29/2022)  Depression (PHQ2-9): High Risk (03/26/2023)  Financial Resource Strain: Low Risk  (11/29/2022)  Physical Activity: Inactive (11/29/2022)  Social Connections: Socially Integrated (11/29/2022)  Stress: Stress Concern Present (11/29/2022)   Tobacco Use: Low Risk  (04/01/2023)     Readmission Risk Interventions     No data to display

## 2023-04-02 NOTE — TOC Initial Note (Addendum)
Transition of Care [facility]) - Initial/Assessment Note    Patient Details  Name: Alexis Farmer MRN: [id number] Date of Birth: 1940/12/16  Transition of Care [facility]) CM/SW Contact:    Darolyn Rua, LCSW Phone Number: 04/02/2023, 10:55 AM  Clinical Narrative:                  Update: Bed Offers provided to patient and daughter, chose [facility]. CSW has reached out to [facility] admissions pending response if patient can discharge today vs tomorrow     CSW met with patient at bedside to review SNF recommendations, she reports she is in agreement. Reports she has not been to SNF in the past, would like referrals to be sent out in [place] area for bed offers. Reports that she lives home alone and understands she would not be safe to go home right now. Does request CSW call her granddaughter to update, granddaughter has been called, no answer lvm.    Referrals sent pending bed offers.   Expected Discharge Plan: Skilled Nursing Facility Barriers to Discharge: Continued Medical Work up   Patient Goals and CMS Choice Patient states their goals for this hospitalization and ongoing recovery are:: to go home CMS Medicare.gov Compare Post Acute Care list provided to:: Patient Choice offered to / list presented to : Patient      Expected Discharge Plan and Services       Living arrangements for the past 2 months: Single Family Home                                      Prior Living Arrangements/Services Living arrangements for the past 2 months: Single Family Home Lives with:: Self                   Activities of Daily Living   ADL Screening (condition at time of admission) Independently performs ADLs?: Yes (appropriate for developmental age) Is the patient deaf or have difficulty hearing?: No Does the patient have difficulty seeing, even when wearing glasses/contacts?: No Does the patient have difficulty concentrating, remembering, or making decisions?: No  Permission  Sought/Granted                  Emotional Assessment   Attitude/Demeanor/Rapport: Gracious Affect (typically observed): Calm Orientation: : Oriented to Self, Oriented to Place, Oriented to  Time, Oriented to Situation Alcohol / Substance Use: Not Applicable Psych Involvement: No (comment)  Admission diagnosis:  Hyponatremia [E87.1] Cellulitis of right lower extremity [L03.115] Fall, initial encounter [W19.XXXA] Ambulatory dysfunction [R26.2] Hyponatremia of newborn [P74.22] Patient Active Problem List   Diagnosis Date Noted   Hyponatremia 04/01/2023   Acute on chronic heart failure with preserved ejection fraction (HFpEF) (HCC) 04/01/2023   Cellulitis 04/01/2023   Lower extremity weakness 04/01/2023   Inability to access health care due to transportation insecurity 03/31/2023   Mood disorder (HCC) 01/20/2023   Vision loss, bilateral 10/22/2022   Pre-syncope 10/22/2022   Ceruminosis, right 09/07/2022   DM type 2, controlled, with complication (HCC) 08/14/2022   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/2022   Hallucinations, visual 07/20/2022   Need for pneumococcal vaccination 07/20/2022   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   Stage 3b chronic kidney disease (HCC) 03/21/2022   Ileus (HCC) 08/22/2021   Lymphedema 04/19/2021   Atherosclerosis of aorta (HCC) 01/16/2021   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/16/2021   Spinal stenosis of lumbar region  01/16/2021   Panic attack 01/15/2021   Other chronic pain 08/25/2020   Hypertension associated with diabetes (HCC) 08/25/2020   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) 05/30/2020   Retinopathy 05/30/2020   Multinodular goiter 02/04/2020   Proteinuria 12/22/2019   Physical deconditioning 08/13/2019   Abdominal aortic atherosclerosis (HCC) 07/24/2019   Morbid obesity with BMI of 45.0-49.9, adult (HCC) 06/09/2019    Hypercalcemia 03/18/2019   Round hole, unspecified eye 02/06/2019   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 08/13/2018   Abnormal gait 03/26/2018   Arthritis 03/26/2018   Thyroid nodule 01/20/2018   UTI (urinary tract infection) 09/24/2016   Rectal hemorrhage 07/29/2016   Carotid artery stenosis 07/19/2016   PAD (peripheral artery disease) (HCC) 07/19/2016   Chronic diastolic CHF (congestive heart failure) (HCC) 05/31/2016   Chronic fatigue 11/15/2015   Depression, recurrent (HCC) 10/13/2015   Osteoarthritis 10/13/2015   Mixed hyperlipidemia 08/26/2014   Uncontrolled type 2 diabetes mellitus with hyperglycemia (HCC) 04/08/2014   Stage 2 chronic kidney disease 04/08/2014   Diabetic polyneuropathy (HCC) 04/08/2014   Type 2 diabetes mellitus with other diabetic kidney complication (HCC) 04/08/2014   PCP:  [name], MD Pharmacy:   [id number] - , [place] - [address] [place] [address] Phone: [phone] Fax: [phone]     Social Determinants of Health (SDOH) Social History: SDOH Screenings   Food Insecurity: No Food Insecurity (11/29/2022)  Housing: Low Risk  (11/29/2022)  Transportation Needs: Unmet Transportation Needs (11/29/2022)  Utilities: Not At Risk (11/29/2022)  Alcohol Screen: Low Risk  (11/29/2022)  Depression (PHQ2-9): High Risk (03/26/2023)  Financial Resource Strain: Low Risk  (11/29/2022)  Physical Activity: Inactive (11/29/2022)  Social Connections: Socially Integrated (11/29/2022)  Stress: Stress Concern Present (11/29/2022)  Tobacco Use: Low Risk  (04/01/2023)   SDOH Interventions:     Readmission Risk Interventions     No data to display

## 2023-04-02 NOTE — Discharge Summary (Signed)
Physician Discharge Summary   Patient: Alexis Farmer MRN: [id number] DOB: 25-Jun-1940  Admit date:     04/01/2023  Discharge date:   Discharge Physician: Jonah Blue   PCP: [name], MD   Recommendations at discharge:   You are being discharged to [facility] for [facility] Complete 5 days of antibiotics for left lower leg cellulitis If your leg is not improving, you will need an ultrasound of that leg to rule out a blood clot Follow up with Dr. Clent Ridges after discharge from [facility]  Discharge Diagnoses: Principal Problem:   Hyponatremia Active Problems:   Acute on chronic heart failure with preserved ejection fraction (HFpEF) (HCC)   Lower extremity weakness   Mixed hyperlipidemia   Type 2 diabetes mellitus with other diabetic kidney complication (HCC)   Stage 3b chronic kidney disease (HCC)   Cellulitis   Cellulitis of left lower extremity    Hospital Course: 82yo with h/o HFpEF, stage 3a CKD, DM, HTN, HLD, and morbid obesity who presented on 10/14 with LE pain and weakness.  She has had COVID (treated with Paxlovid) and UTI (completed antibiotics) in the last few weeks.  Nonambulatory at baseline. Noted to have hypervolemic hyponatremia, diuresed.  PT/OT consulted.  Started on Ceftriaxone and Metrondiazole for possible LLE cellulitis.  Assessment and Plan:  Generalized LE weakness She reports that she has been having her legs give out for some time now and is falling repeatedly No apparent acute issue, just weakness She has decided that she can't live alone anymore and is planning to move in with her granddaughter in [place] in January once accommodations have been made PT/OT consults   Hyponatremia Baseline Na in low 130s, 123 on presentation It has improved to 127 Patient is asymptomatic No further intervention appears to be needed at this time   Chronic heart failure with preserved ejection fraction  2D ECHO 11/2020 w/ EF 60-65%  Concern for volume overload w/  acute on chronic hyponatremia on presentation  Given IV diuresis -> resume home torsemide and spironolactone Appears to be compensated at this time   Possible Cellulitis Positive left lower extremity redness swelling and tenderness to palpation to right anterior shin concerning for mild cellulitis No fever, leukocytosis Started on IV Rocephin, Flagyl Blood cultures NTD Will change to cephalexin for 5 more days   Stage 3b chronic kidney disease  Appears to be stable at this time Attempt to avoid nephrotoxic medications Recheck BMP in AM    Type 2 diabetes mellitus with other diabetic kidney complication  A1C was 9.5 in May, poor control Will order glargine since NPH is not available for inpatients Diabetes coordinator is consulting   HTN Continue hydralazine, clonidine, valsartan, nebivolol   Mixed hyperlipidemia Continue rosuvastatin   Delirium Had behavioral issues following admission Appears much more calm this AM and without complaint - but she did also refuse LLE DVT US   Morbid obesity Body mass index is 44.52 kg/m.Marland Kitchen  Weight loss should be encouraged Outpatient PCP/bariatric medicine/bariatric surgery f/u encouraged      Consultants: PT/OT TOC team   Procedures: DVT US   Antibiotics: Ceftriaxone 10/14-15 Metronidazole 10/14-15 Cephalexin 10/15-20  30 Day Unplanned Readmission Risk Score    Flowsheet Row ED to Hosp-Admission (Current) from 04/01/2023 in [facility]  30 Day Unplanned Readmission Risk Score (%) 18.11 Filed at 04/02/2023 1200       This score is the patient's risk of an unplanned readmission within 30 days of being discharged (0 -100%).  common femoral, femoral, profunda femoral, popliteal and calf veins including the posterior tibial, peroneal and gastrocnemius veins when visible. Spectral Doppler was utilized to evaluate flow at rest and with distal augmentation maneuvers in the common femoral, femoral and popliteal veins. COMPARISON:  None Available. FINDINGS: Common Femoral Vein: No evidence of thrombus. Normal compressibility, respiratory phasicity and response to augmentation. Saphenofemoral Junction: No evidence of thrombus. Normal compressibility and flow on color Doppler imaging. Profunda Femoral Vein: No evidence of thrombus. Normal compressibility and flow on color Doppler imaging. Femoral Vein: No evidence of thrombus. Normal compressibility, respiratory phasicity and response to augmentation. Popliteal Vein: No evidence of thrombus. Normal compressibility, respiratory phasicity and response to augmentation. Calf Veins: Limited visualization of the calf veins. No significant acute thrombus appreciated. IMPRESSION: No significant right lower extremity femoropopliteal DVT. Limited visualization of the calf veins. Electronically Signed   By: Judie Petit.  Shick M.D.   On: 04/02/2023 15:40   ECHOCARDIOGRAM COMPLETE  Result Date: 04/01/2023    ECHOCARDIOGRAM REPORT   Patient Name:   Alexis Farmer Date of Exam: 04/01/2023 Medical Rec #:  [id number]        Height:       63.0 in Accession #:     [id number]       Weight:       251.3 lb Date of Birth:  February 18, 1941        BSA:          2.131 m Patient Age:    82 years         BP:           147/74 mmHg Patient Gender: F                HR:           63 bpm. Exam Location:  [facility] Procedure: 2D Echo, Cardiac Doppler and Color Doppler Indications:     CHF-acute diastolic I50.31  History:         Patient has prior history of Echocardiogram examinations, most                  recent 12/15/2020. Risk Factors:Hypertension and Diabetes.  Sonographer:     Cristela Blue Referring Phys:  [id number] Francoise Schaumann [place] Diagnosing Phys: Lorine Bears MD  Sonographer Comments: Suboptimal apical window. IMPRESSIONS  1. Left ventricular ejection fraction, by estimation, is 60 to 65%. The left ventricle has normal function. The left ventricle has no regional wall motion abnormalities. There is moderate left ventricular hypertrophy. Left ventricular diastolic parameters are consistent with Grade I diastolic dysfunction (impaired relaxation).  2. Right ventricular systolic function is normal. The right ventricular size is normal. Tricuspid regurgitation signal is inadequate for assessing PA pressure.  3. Left atrial size was mildly dilated.  4. The mitral valve is normal in structure. No evidence of mitral valve regurgitation. No evidence of mitral stenosis.  5. The aortic valve is normal in structure. Aortic valve regurgitation is not visualized. No aortic stenosis is present. FINDINGS  Left Ventricle: Left ventricular ejection fraction, by estimation, is 60 to 65%. The left ventricle has normal function. The left ventricle has no regional wall motion abnormalities. The left ventricular internal cavity size was normal in size. There is  moderate left ventricular hypertrophy. Left ventricular diastolic parameters are consistent with Grade I diastolic dysfunction (impaired relaxation). Right Ventricle: The right ventricular size is normal. No increase in right ventricular wall thickness. Right  ventricular systolic function is normal. Tricuspid regurgitation signal is  Physician Discharge Summary   Patient: Alexis Farmer MRN: [id number] DOB: 25-Jun-1940  Admit date:     04/01/2023  Discharge date:   Discharge Physician: Jonah Blue   PCP: [name], MD   Recommendations at discharge:   You are being discharged to [facility] for rehabilitation Complete 5 days of antibiotics for left lower leg cellulitis If your leg is not improving, you will need an ultrasound of that leg to rule out a blood clot Follow up with Dr. Clent Ridges after discharge from [facility]  Discharge Diagnoses: Principal Problem:   Hyponatremia Active Problems:   Acute on chronic heart failure with preserved ejection fraction (HFpEF) (HCC)   Lower extremity weakness   Mixed hyperlipidemia   Type 2 diabetes mellitus with other diabetic kidney complication (HCC)   Stage 3b chronic kidney disease (HCC)   Cellulitis   Cellulitis of left lower extremity    Hospital Course: 82yo with h/o HFpEF, stage 3a CKD, DM, HTN, HLD, and morbid obesity who presented on 10/14 with LE pain and weakness.  She has had COVID (treated with Paxlovid) and UTI (completed antibiotics) in the last few weeks.  Nonambulatory at baseline. Noted to have hypervolemic hyponatremia, diuresed.  PT/OT consulted.  Started on Ceftriaxone and Metrondiazole for possible LLE cellulitis.  Assessment and Plan:  Generalized LE weakness She reports that she has been having her legs give out for some time now and is falling repeatedly No apparent acute issue, just weakness She has decided that she can't live alone anymore and is planning to move in with her granddaughter in [place] in January once accommodations have been made PT/OT consults   Hyponatremia Baseline Na in low 130s, 123 on presentation It has improved to 127 Patient is asymptomatic No further intervention appears to be needed at this time   Chronic heart failure with preserved ejection fraction  2D ECHO 11/2020 w/ EF 60-65%  Concern for volume overload w/  acute on chronic hyponatremia on presentation  Given IV diuresis -> resume home torsemide and spironolactone Appears to be compensated at this time   Possible Cellulitis Positive left lower extremity redness swelling and tenderness to palpation to right anterior shin concerning for mild cellulitis No fever, leukocytosis Started on IV Rocephin, Flagyl Blood cultures NTD Will change to cephalexin for 5 more days   Stage 3b chronic kidney disease  Appears to be stable at this time Attempt to avoid nephrotoxic medications Recheck BMP in AM    Type 2 diabetes mellitus with other diabetic kidney complication  A1C was 9.5 in May, poor control Will order glargine since NPH is not available for inpatients Diabetes coordinator is consulting   HTN Continue hydralazine, clonidine, valsartan, nebivolol   Mixed hyperlipidemia Continue rosuvastatin   Delirium Had behavioral issues following admission Appears much more calm this AM and without complaint - but she did also refuse LLE DVT US   Morbid obesity Body mass index is 44.52 kg/m.Marland Kitchen  Weight loss should be encouraged Outpatient PCP/bariatric medicine/bariatric surgery f/u encouraged      Consultants: PT/OT TOC team   Procedures: DVT US   Antibiotics: Ceftriaxone 10/14-15 Metronidazole 10/14-15 Cephalexin 10/15-20  30 Day Unplanned Readmission Risk Score    Flowsheet Row ED to Hosp-Admission (Current) from 04/01/2023 in [facility]  30 Day Unplanned Readmission Risk Score (%) 18.11 Filed at 04/02/2023 1200       This score is the patient's risk of an unplanned readmission within 30 days of being discharged (0 -100%).  Physician Discharge Summary   Patient: Alexis Farmer MRN: [id number] DOB: 25-Jun-1940  Admit date:     04/01/2023  Discharge date:   Discharge Physician: Jonah Blue   PCP: [name], MD   Recommendations at discharge:   You are being discharged to [facility] for [facility] Complete 5 days of antibiotics for left lower leg cellulitis If your leg is not improving, you will need an ultrasound of that leg to rule out a blood clot Follow up with Dr. Clent Ridges after discharge from [facility]  Discharge Diagnoses: Principal Problem:   Hyponatremia Active Problems:   Acute on chronic heart failure with preserved ejection fraction (HFpEF) (HCC)   Lower extremity weakness   Mixed hyperlipidemia   Type 2 diabetes mellitus with other diabetic kidney complication (HCC)   Stage 3b chronic kidney disease (HCC)   Cellulitis   Cellulitis of left lower extremity    Hospital Course: 82yo with h/o HFpEF, stage 3a CKD, DM, HTN, HLD, and morbid obesity who presented on 10/14 with LE pain and weakness.  She has had COVID (treated with Paxlovid) and UTI (completed antibiotics) in the last few weeks.  Nonambulatory at baseline. Noted to have hypervolemic hyponatremia, diuresed.  PT/OT consulted.  Started on Ceftriaxone and Metrondiazole for possible LLE cellulitis.  Assessment and Plan:  Generalized LE weakness She reports that she has been having her legs give out for some time now and is falling repeatedly No apparent acute issue, just weakness She has decided that she can't live alone anymore and is planning to move in with her granddaughter in [place] in January once accommodations have been made PT/OT consults   Hyponatremia Baseline Na in low 130s, 123 on presentation It has improved to 127 Patient is asymptomatic No further intervention appears to be needed at this time   Chronic heart failure with preserved ejection fraction  2D ECHO 11/2020 w/ EF 60-65%  Concern for volume overload w/  acute on chronic hyponatremia on presentation  Given IV diuresis -> resume home torsemide and spironolactone Appears to be compensated at this time   Possible Cellulitis Positive left lower extremity redness swelling and tenderness to palpation to right anterior shin concerning for mild cellulitis No fever, leukocytosis Started on IV Rocephin, Flagyl Blood cultures NTD Will change to cephalexin for 5 more days   Stage 3b chronic kidney disease  Appears to be stable at this time Attempt to avoid nephrotoxic medications Recheck BMP in AM    Type 2 diabetes mellitus with other diabetic kidney complication  A1C was 9.5 in May, poor control Will order glargine since NPH is not available for inpatients Diabetes coordinator is consulting   HTN Continue hydralazine, clonidine, valsartan, nebivolol   Mixed hyperlipidemia Continue rosuvastatin   Delirium Had behavioral issues following admission Appears much more calm this AM and without complaint - but she did also refuse LLE DVT US   Morbid obesity Body mass index is 44.52 kg/m.Marland Kitchen  Weight loss should be encouraged Outpatient PCP/bariatric medicine/bariatric surgery f/u encouraged      Consultants: PT/OT TOC team   Procedures: DVT US   Antibiotics: Ceftriaxone 10/14-15 Metronidazole 10/14-15 Cephalexin 10/15-20  30 Day Unplanned Readmission Risk Score    Flowsheet Row ED to Hosp-Admission (Current) from 04/01/2023 in [facility]  30 Day Unplanned Readmission Risk Score (%) 18.11 Filed at 04/02/2023 1200       This score is the patient's risk of an unplanned readmission within 30 days of being discharged (0 -100%).  Physician Discharge Summary   Patient: Alexis Farmer MRN: [id number] DOB: 25-Jun-1940  Admit date:     04/01/2023  Discharge date:   Discharge Physician: Jonah Blue   PCP: [name], MD   Recommendations at discharge:   You are being discharged to [facility] for rehabilitation Complete 5 days of antibiotics for left lower leg cellulitis If your leg is not improving, you will need an ultrasound of that leg to rule out a blood clot Follow up with Dr. Clent Ridges after discharge from [facility]  Discharge Diagnoses: Principal Problem:   Hyponatremia Active Problems:   Acute on chronic heart failure with preserved ejection fraction (HFpEF) (HCC)   Lower extremity weakness   Mixed hyperlipidemia   Type 2 diabetes mellitus with other diabetic kidney complication (HCC)   Stage 3b chronic kidney disease (HCC)   Cellulitis   Cellulitis of left lower extremity    Hospital Course: 82yo with h/o HFpEF, stage 3a CKD, DM, HTN, HLD, and morbid obesity who presented on 10/14 with LE pain and weakness.  She has had COVID (treated with Paxlovid) and UTI (completed antibiotics) in the last few weeks.  Nonambulatory at baseline. Noted to have hypervolemic hyponatremia, diuresed.  PT/OT consulted.  Started on Ceftriaxone and Metrondiazole for possible LLE cellulitis.  Assessment and Plan:  Generalized LE weakness She reports that she has been having her legs give out for some time now and is falling repeatedly No apparent acute issue, just weakness She has decided that she can't live alone anymore and is planning to move in with her granddaughter in [place] in January once accommodations have been made PT/OT consults   Hyponatremia Baseline Na in low 130s, 123 on presentation It has improved to 127 Patient is asymptomatic No further intervention appears to be needed at this time   Chronic heart failure with preserved ejection fraction  2D ECHO 11/2020 w/ EF 60-65%  Concern for volume overload w/  acute on chronic hyponatremia on presentation  Given IV diuresis -> resume home torsemide and spironolactone Appears to be compensated at this time   Possible Cellulitis Positive left lower extremity redness swelling and tenderness to palpation to right anterior shin concerning for mild cellulitis No fever, leukocytosis Started on IV Rocephin, Flagyl Blood cultures NTD Will change to cephalexin for 5 more days   Stage 3b chronic kidney disease  Appears to be stable at this time Attempt to avoid nephrotoxic medications Recheck BMP in AM    Type 2 diabetes mellitus with other diabetic kidney complication  A1C was 9.5 in May, poor control Will order glargine since NPH is not available for inpatients Diabetes coordinator is consulting   HTN Continue hydralazine, clonidine, valsartan, nebivolol   Mixed hyperlipidemia Continue rosuvastatin   Delirium Had behavioral issues following admission Appears much more calm this AM and without complaint - but she did also refuse LLE DVT US   Morbid obesity Body mass index is 44.52 kg/m.Marland Kitchen  Weight loss should be encouraged Outpatient PCP/bariatric medicine/bariatric surgery f/u encouraged      Consultants: PT/OT TOC team   Procedures: DVT US   Antibiotics: Ceftriaxone 10/14-15 Metronidazole 10/14-15 Cephalexin 10/15-20  30 Day Unplanned Readmission Risk Score    Flowsheet Row ED to Hosp-Admission (Current) from 04/01/2023 in [facility] PC[facility]  30 Day Unplanned Readmission Risk Score (%) 18.11 Filed at 04/02/2023 1200       This score is the patient's risk of an unplanned readmission within 30 days of being discharged (0 -100%).  Physician Discharge Summary   Patient: Alexis Farmer MRN: [id number] DOB: 25-Jun-1940  Admit date:     04/01/2023  Discharge date:   Discharge Physician: Jonah Blue   PCP: [name], MD   Recommendations at discharge:   You are being discharged to [facility] for rehabilitation Complete 5 days of antibiotics for left lower leg cellulitis If your leg is not improving, you will need an ultrasound of that leg to rule out a blood clot Follow up with Dr. Clent Ridges after discharge from [facility]  Discharge Diagnoses: Principal Problem:   Hyponatremia Active Problems:   Acute on chronic heart failure with preserved ejection fraction (HFpEF) (HCC)   Lower extremity weakness   Mixed hyperlipidemia   Type 2 diabetes mellitus with other diabetic kidney complication (HCC)   Stage 3b chronic kidney disease (HCC)   Cellulitis   Cellulitis of left lower extremity    Hospital Course: 82yo with h/o HFpEF, stage 3a CKD, DM, HTN, HLD, and morbid obesity who presented on 10/14 with LE pain and weakness.  She has had COVID (treated with Paxlovid) and UTI (completed antibiotics) in the last few weeks.  Nonambulatory at baseline. Noted to have hypervolemic hyponatremia, diuresed.  PT/OT consulted.  Started on Ceftriaxone and Metrondiazole for possible LLE cellulitis.  Assessment and Plan:  Generalized LE weakness She reports that she has been having her legs give out for some time now and is falling repeatedly No apparent acute issue, just weakness She has decided that she can't live alone anymore and is planning to move in with her granddaughter in [place] in January once accommodations have been made PT/OT consults   Hyponatremia Baseline Na in low 130s, 123 on presentation It has improved to 127 Patient is asymptomatic No further intervention appears to be needed at this time   Chronic heart failure with preserved ejection fraction  2D ECHO 11/2020 w/ EF 60-65%  Concern for volume overload w/  acute on chronic hyponatremia on presentation  Given IV diuresis -> resume home torsemide and spironolactone Appears to be compensated at this time   Possible Cellulitis Positive left lower extremity redness swelling and tenderness to palpation to right anterior shin concerning for mild cellulitis No fever, leukocytosis Started on IV Rocephin, Flagyl Blood cultures NTD Will change to cephalexin for 5 more days   Stage 3b chronic kidney disease  Appears to be stable at this time Attempt to avoid nephrotoxic medications Recheck BMP in AM    Type 2 diabetes mellitus with other diabetic kidney complication  A1C was 9.5 in May, poor control Will order glargine since NPH is not available for inpatients Diabetes coordinator is consulting   HTN Continue hydralazine, clonidine, valsartan, nebivolol   Mixed hyperlipidemia Continue rosuvastatin   Delirium Had behavioral issues following admission Appears much more calm this AM and without complaint - but she did also refuse LLE DVT US   Morbid obesity Body mass index is 44.52 kg/m.Marland Kitchen  Weight loss should be encouraged Outpatient PCP/bariatric medicine/bariatric surgery f/u encouraged      Consultants: PT/OT TOC team   Procedures: DVT US   Antibiotics: Ceftriaxone 10/14-15 Metronidazole 10/14-15 Cephalexin 10/15-20  30 Day Unplanned Readmission Risk Score    Flowsheet Row ED to Hosp-Admission (Current) from 04/01/2023 in [facility]  30 Day Unplanned Readmission Risk Score (%) 18.11 Filed at 04/02/2023 1200       This score is the patient's risk of an unplanned readmission within 30 days of being discharged (0 -100%).  common femoral, femoral, profunda femoral, popliteal and calf veins including the posterior tibial, peroneal and gastrocnemius veins when visible. Spectral Doppler was utilized to evaluate flow at rest and with distal augmentation maneuvers in the common femoral, femoral and popliteal veins. COMPARISON:  None Available. FINDINGS: Common Femoral Vein: No evidence of thrombus. Normal compressibility, respiratory phasicity and response to augmentation. Saphenofemoral Junction: No evidence of thrombus. Normal compressibility and flow on color Doppler imaging. Profunda Femoral Vein: No evidence of thrombus. Normal compressibility and flow on color Doppler imaging. Femoral Vein: No evidence of thrombus. Normal compressibility, respiratory phasicity and response to augmentation. Popliteal Vein: No evidence of thrombus. Normal compressibility, respiratory phasicity and response to augmentation. Calf Veins: Limited visualization of the calf veins. No significant acute thrombus appreciated. IMPRESSION: No significant right lower extremity femoropopliteal DVT. Limited visualization of the calf veins. Electronically Signed   By: Judie Petit.  Shick M.D.   On: 04/02/2023 15:40   ECHOCARDIOGRAM COMPLETE  Result Date: 04/01/2023    ECHOCARDIOGRAM REPORT   Patient Name:   Alexis Farmer Date of Exam: 04/01/2023 Medical Rec #:  [id number]        Height:       63.0 in Accession #:     [id number]       Weight:       251.3 lb Date of Birth:  February 18, 1941        BSA:          2.131 m Patient Age:    82 years         BP:           147/74 mmHg Patient Gender: F                HR:           63 bpm. Exam Location:  [facility] Procedure: 2D Echo, Cardiac Doppler and Color Doppler Indications:     CHF-acute diastolic I50.31  History:         Patient has prior history of Echocardiogram examinations, most                  recent 12/15/2020. Risk Factors:Hypertension and Diabetes.  Sonographer:     Cristela Blue Referring Phys:  [id number] Francoise Schaumann [place] Diagnosing Phys: Lorine Bears MD  Sonographer Comments: Suboptimal apical window. IMPRESSIONS  1. Left ventricular ejection fraction, by estimation, is 60 to 65%. The left ventricle has normal function. The left ventricle has no regional wall motion abnormalities. There is moderate left ventricular hypertrophy. Left ventricular diastolic parameters are consistent with Grade I diastolic dysfunction (impaired relaxation).  2. Right ventricular systolic function is normal. The right ventricular size is normal. Tricuspid regurgitation signal is inadequate for assessing PA pressure.  3. Left atrial size was mildly dilated.  4. The mitral valve is normal in structure. No evidence of mitral valve regurgitation. No evidence of mitral stenosis.  5. The aortic valve is normal in structure. Aortic valve regurgitation is not visualized. No aortic stenosis is present. FINDINGS  Left Ventricle: Left ventricular ejection fraction, by estimation, is 60 to 65%. The left ventricle has normal function. The left ventricle has no regional wall motion abnormalities. The left ventricular internal cavity size was normal in size. There is  moderate left ventricular hypertrophy. Left ventricular diastolic parameters are consistent with Grade I diastolic dysfunction (impaired relaxation). Right Ventricle: The right ventricular size is normal. No increase in right ventricular wall thickness. Right  ventricular systolic function is normal. Tricuspid regurgitation signal is

## 2023-04-02 NOTE — Hospital Course (Signed)
82yo with h/o HFpEF, stage 3a CKD, DM, HTN, HLD, and morbid obesity who presented on 10/14 with LE pain and weakness.  She has had COVID (treated with Paxlovid) and UTI (completed antibiotics) in the last few weeks.  Nonambulatory at baseline. Noted to have hypervolemic hyponatremia, diuresed.  PT/OT consulted.  Started on Ceftriaxone and Metrondiazole for possible LLE cellulitis.

## 2023-04-02 NOTE — Plan of Care (Signed)
  Problem: Health Behavior/Discharge Planning: Goal: Ability to identify and utilize available resources and services will improve Outcome: Progressing Goal: Ability to manage health-related needs will improve Outcome: Progressing   Problem: Education: Goal: Knowledge of General Education information will improve Description: Including pain rating scale, medication(s)/side effects and non-pharmacologic comfort measures Outcome: Progressing   Problem: Health Behavior/Discharge Planning: Goal: Ability to manage health-related needs will improve Outcome: Progressing   Problem: Activity: Goal: Risk for activity intolerance will decrease Outcome: Progressing   Problem: Nutrition: Goal: Adequate nutrition will be maintained Outcome: Progressing   Problem: Coping: Goal: Level of anxiety will decrease Outcome: Progressing   Problem: Pain Managment: Goal: General experience of comfort will improve Outcome: Progressing

## 2023-04-02 NOTE — Care Management Obs Status (Signed)
MEDICARE OBSERVATION STATUS NOTIFICATION   Patient Details  Name: Alexis Farmer MRN: [id number] Date of Birth: Dec 12, 1940   Medicare Observation Status Notification Given:  Yes    Darolyn Rua, LCSW 04/02/2023, 4:17 PM

## 2023-04-02 NOTE — Progress Notes (Signed)
Occupational Therapy Treatment Patient Details Name: Alexis Farmer MRN: [id number] DOB: February 09, 1941 Today's Date: 04/02/2023   History of present illness Alexis Farmer is a 82 y.o. female with medical history significant of morbid obesity, chronic HFpEF, stage II-III CKD, type 2 diabetes, chronic weakness, hypertension, hyperlipidemia presenting with lower extremity weakness, acute on chronic hyponatremia, acute on chronic HFpEF.  Fairly limited history as patient is poor historian.  Per report, patient with worsening weakness over multiple days.  No fevers or chills.  No nausea or vomiting.  Patient noted to have been recently seen within the past 2 weeks in the ER for issues including COVID and UTI.  Has completed courses of Paxlovid as well as antibiotics.  Has had worsening with ambulation at home. Current MD assessment: lower extremity weakness, acute on chronic hyponatremia, acute on chronic HFpEF, LLE cellulitis.   OT comments  Pt. was eating breakfast upon arrival. Pt. was assisted with repositioning in upright/midline, as well as meal tray reconfiguration, and set-up for easier access. Pt. reports 8/10 pain in the bilateral LE's with the left being worse than the right. Pt. Nurse in to provide pain medication. Pt. Required maxA for repositioining. Pt. Performed LUE there. Ex with red theraband for elbow flexion, extension, and horizontal abduction 2 sets 10 reps each. RUE N/A 2/2 position of the IV. UE strengthening was performed in preparation for transfers, and ADLs. Pt. Continues to benefit from OT services for ADL training, A/E training, UE there. Ex. and pt. Education about home modification, and DME.       If plan is discharge home, recommend the following:  Two people to help with walking and/or transfers;Two people to help with bathing/dressing/bathroom;Assistance with feeding;Assist for transportation;Assistance with cooking/housework   Equipment Recommendations  None  recommended by OT    Recommendations for Other Services      Precautions / Restrictions Precautions Precautions: Fall Restrictions Weight Bearing Restrictions: No       Mobility Bed Mobility Overal bed mobility: Needs Assistance             General bed mobility comments: MaxA for repositioning  for comfort 2/2 bilateral LE pain, and into upright/midline position in preparation for meal    Transfers                         Balance                                           ADL either performed or assessed with clinical judgement   ADL   Eating/Feeding: Set up;Independent Eating/Feeding Details (indicate cue type and reason): Assist assist required for complete meal tray set-up, and positioning upright midline for meal                                   General ADL Comments: MaxA LE ADLs    Extremity/Trunk Assessment Upper Extremity Assessment Upper Extremity Assessment: Generalized weakness            Vision Baseline Vision/History: 1 Wears glasses     Perception     Praxis      Cognition Arousal: Alert Behavior During Therapy: WFL for tasks assessed/performed Overall Cognitive Status: Within Functional Limits for tasks assessed  Exercises      Shoulder Instructions       General Comments      Pertinent Vitals/ Pain       Pain Assessment Pain Score: 8  Pain Location: Bilateral LE pain with left worse the the right Pain Descriptors / Indicators: Aching, Discomfort, Grimacing, Shooting, Sore Pain Intervention(s): Monitored during session, Repositioned, Limited activity within patient's tolerance, RN gave pain meds during session  Home Living                                          Prior Functioning/Environment              Frequency  Min 1X/week        Progress Toward Goals  OT Goals(current goals can now be  found in the care plan section)  Progress towards OT goals: Progressing toward goals  Acute Rehab OT Goals OT Goal Formulation: With patient Time For Goal Achievement: 04/15/23 Potential to Achieve Goals: Fair  Plan      Co-evaluation                 AM-PAC OT "6 Clicks" Daily Activity     Outcome Measure   Help from another person eating meals?: A Little Help from another person taking care of personal grooming?: A Little Help from another person toileting, which includes using toliet, bedpan, or urinal?: A Lot Help from another person bathing (including washing, rinsing, drying)?: A Lot Help from another person to put on and taking off regular upper body clothing?: A Little Help from another person to put on and taking off regular lower body clothing?: A Lot 6 Click Score: 15    End of Session    OT Visit Diagnosis: Muscle weakness (generalized) (M62.81);History of falling (Z91.81);Repeated falls (R29.6);Other abnormalities of gait and mobility (R26.89);Unsteadiness on feet (R26.81)   Activity Tolerance Patient limited by pain   Patient Left in bed;with call bell/phone within reach;with bed alarm set   Nurse Communication          Time: 9:08-9:14 & 9:28-9:47 25 min. Total    Charges: OT General Charges $OT Visit: 1 Visit OT Treatments $Self Care/Home Management : 23-37 mins  Olegario Messier, MS, OTR/L   Olegario Messier 04/02/2023, 10:03 AM

## 2023-04-02 NOTE — Telephone Encounter (Signed)
Patient called and wanted Dr Clent Ridges to know she is in the hospital. She fell and her leg hurts.

## 2023-04-06 LAB — CULTURE, BLOOD (ROUTINE X 2)
Culture: NO GROWTH
Culture: NO GROWTH
Special Requests: ADEQUATE
Special Requests: ADEQUATE

## 2023-04-08 ENCOUNTER — Telehealth: Payer: Self-pay | Admitting: *Deleted

## 2023-04-08 NOTE — Telephone Encounter (Addendum)
Looks like appt in Nov has been cancelled.

## 2023-04-08 NOTE — Telephone Encounter (Signed)
Patient called reporting that she is in rehab and will not be able to come fro her 11/18/ appointment and is asking if you could order labs to be drawn art facility or can she just wait to have labs drawn until later. Please advise. She had labs drawn in hospital  CBC Order: [id number] Status: Final result     Visible to patient: No (inaccessible in MyChart)     Next appt: 04/16/2023 at 11:20 AM in Family Medicine [name], MD)   0 Result Notes          Component Ref Range & Units 6 d ago 7 d ago 2 wk ago 3 mo ago 4 mo ago 5 mo ago 6 mo ago  WBC 4.0 - 10.5 K/uL 8.1 7.0 10.6 High  7.4 7.9 10.3 7.3  RBC 3.87 - 5.11 MIL/uL 3.98 4.02 4.39 4.17 R 4.04 4.48 4.22  Hemoglobin 12.0 - 15.0 g/dL 40.9 Low  81.1 Low  91.4 12.1 11.7 Low  12.9 12.2  HCT 36.0 - 46.0 % 34.8 Low  35.3 Low  39.7 37.6 36.6 40.3 37.9  MCV 80.0 - 100.0 fL 87.4 87.8 90.4 90.3 R 90.6 90.0 89.8  MCH 26.0 - 34.0 pg 29.4 28.9 29.4  29.0 28.8 28.9  MCHC 30.0 - 36.0 g/dL 78.2 95.6 21.3 08.6 57.8 32.0 32.2  RDW 11.5 - 15.5 % 12.8 12.8 13.1 13.8 13.5 13.2 13.2  Platelets 150 - 400 K/uL 258 263 325 293.0 R 231 258 237  nRBC 0.0 - 0.2 % 0.0 0.0 0.0  0.0 0.0 0.0  Comment: Performed at [facility], [address] [place], [place] [address]  Neutrophils Relative %  33 R 47 R  31 R 33 R 35 R  Basophils Absolute  0.1 R 0.1 R  0.1 R 0.1 R 0.1 R  Immature Granulocytes  0 R 1 R  0 R 0 R 0 R  Abs Immature Granulocytes  0.02 R, CM 0.06 R, CM  0.02 R, CM 0.02 R, CM 0.01 R, CM  WBC Morphology      >10% Reactive Benign Lymphoctyes   RBC Morphology      MORPHOLOGY UNREMARKABLE   Smear Review      Normal platelet morphology   Neutro Abs  2.3 R 4.9 R  2.5 R 3.4 R 2.6 R  Lymphocytes Relative  54 R 41 R  56 R 56 R 55 R  Lymphs Abs  3.7 R 4.4 High  R  4.4 High  R 5.7 High  R 4.0 R  Monocytes Relative  10 R 9 R  9 R 7 R 8 R  Monocytes Absolute  0.7 R 1.0 R  0.7 R 0.8 R 0.6 R  Eosinophils Relative  2 R 1 R  3 R 3 R 1 R   Eosinophils Absolute  0.2 R 0.1 R  0.2 R 0.3 R 0.1 R  Basophils Relative  1 R 1 R  1 R 1 R 1 R  Resulting Agency [facility] [place] [facility]         Specimen Collected: 04/02/23 04:29 Last Resulted: 04/02/23 05:13     Comprehensive metabolic panel Order: [id number] Status: Final result     Visible to patient: No (inaccessible in MyChart)     Next appt: 04/16/2023 at 11:20 AM in Family Medicine [name], MD)   0 Result Notes  1 HM Topic          Component Ref Range & Units 6 d ago (04/02/23) 7 d ago (04/01/23) 7 d ago (04/01/23) 7 d ago (04/01/23) 7 d ago (04/01/23) 2 wk ago (03/21/23) 3 mo ago (01/08/23)  Sodium 135 - 145 mmol/L 127 Low  129 Low  CM 129 Low  CM 127 Low  CM 123 Low  130 Low  130 Low  R  Potassium 3.5 - 5.1 mmol/L 4.0    4.6 4.9 5.0 R  Chloride 98 - 111 mmol/L 91 Low     88 Low  93 Low  93 Low  R  CO2 22 - 32 mmol/L 25    27 25 28  R  Glucose, Bld 70 - 99 mg/dL 295 High     621 High  CM 247 High  CM 306 High   Comment: Glucose reference range applies only to samples taken after fasting for at least 8 hours.  BUN 8 - 23 mg/dL 25 High     28 High  24 High  32 High  R  Creatinine, Ser 0.44 - 1.00 mg/dL 3.08 High     6.57 High  1.28 High  1.53 High  R  Calcium 8.9 - 10.3 mg/dL 9.4    9.5 84.6 High  96.2 High  R  Total Protein 6.5 - 8.1 g/dL 6.7        Albumin 3.5 - 5.0 g/dL 3.5        AST 15 - 41 U/L 22        ALT 0 - 44 U/L 17        Alkaline Phosphatase 38 - 126 U/L 40        Total Bilirubin 0.3 - 1.2 mg/dL 0.9        GFR, Estimated >60 mL/min 41 Low     33 Low  CM 41 Low  CM   Comment: (NOTE) Calculated using the CKD-EPI Creatinine Equation (2021)  Anion gap 5 - 15 11    8  CM 12 CM   Comment: Performed at [facility], [address] [place], [place] [address]  Resulting Agency [facility] [place]  [facility]         Specimen Collected: 04/02/23 04:29 Last Resulted: 10/15/2

## 2023-04-08 NOTE — Telephone Encounter (Signed)
Call returned to patient to inform of doctor response to call back when ready to come back. She stated OK thank you I will

## 2023-04-15 ENCOUNTER — Ambulatory Visit: Payer: Medicare Other | Admitting: Family Medicine

## 2023-04-16 ENCOUNTER — Ambulatory Visit: Payer: Medicare Other | Admitting: Family Medicine

## 2023-04-19 ENCOUNTER — Other Ambulatory Visit: Payer: Self-pay | Admitting: *Deleted

## 2023-04-19 NOTE — Patient Outreach (Signed)
Alexis Farmer resides in [facility]. She recently admitted under [facility] waiver. Alexis Farmer is active with chronic care management team.   Voicemail messages left for both [facility] social workers, Fannie Knee and [place]. Calling to inquire about transition plans/needs/date. Also sent secure message to both [facility] social workers as well.   Attempted to reach Mrs. Ikard by phone at [phone]. No answer. HIPAA compliant voicemail message requesting return call. Attempted to reach [name] (grandaughter/DPR) [phone]. Left HIPAA compliant voicemail message to request return call.   Will keep CCM updated. Will continue to follow.    Raiford Noble, MSN, RN, BSN [place]  [facility], [facility] RN Post- Acute Care Coordinator Direct Dial: [phone]

## 2023-04-23 ENCOUNTER — Ambulatory Visit: Payer: Medicare Other | Admitting: Podiatry

## 2023-04-24 ENCOUNTER — Telehealth: Payer: Self-pay

## 2023-04-25 ENCOUNTER — Telehealth: Payer: Self-pay

## 2023-04-25 NOTE — Patient Outreach (Signed)
  Care Coordination   04/25/2023 Name: Alexis Farmer MRN: [id number] DOB: May 01, 1941   Care Coordination Outreach Attempts:  A second unsuccessful outreach was attempted today to offer the patient with information about available care coordination services. HIPAA compliant message left with call back phone number.   Follow Up Plan:  Additional outreach attempts will be made to offer the patient care coordination information and services.   Encounter Outcome:  No Answer   Care Coordination Interventions:  No, not indicated    [name] RN,BSN,CCM [facility]  [facility], [facility] coordinator / Case Manager Phone: [phone]

## 2023-04-25 NOTE — Patient Outreach (Signed)
  Care Coordination   04/25/2023 Name: Alexis Farmer MRN: [id number] DOB: 08-04-1940   Care Coordination Outreach Attempts:  An unsuccessful telephone outreach was attempted today to offer the patient information about available care coordination services. HIPAA compliant voice message left with return call phone number.   Follow Up Plan:  Additional outreach attempts will be made to offer the patient care coordination information and services.   Encounter Outcome:  No Answer   Care Coordination Interventions:  No, not indicated    [name] RN,BSN,CCM [facility]  [facility], [facility] coordinator / Case Manager Phone: [phone]

## 2023-04-26 ENCOUNTER — Other Ambulatory Visit: Payer: Self-pay | Admitting: *Deleted

## 2023-04-26 NOTE — Patient Outreach (Signed)
Post- Acute Care Coordinator follow up. Verified in [facility] Alexis Farmer discharged from [facility] on 04/22/23. No correspondence received from previous outreaches to [facility] social work team. Silvestre Gunner [facility] was arranged.   She is followed by CCM RN.  Raiford Noble, MSN, RN, BSN [place]  [facility], [facility] RN Post- Acute Care Coordinator Direct Dial: [phone]

## 2023-04-29 ENCOUNTER — Telehealth: Payer: Self-pay

## 2023-04-29 NOTE — Patient Outreach (Signed)
Care Coordination   Follow Up Visit Note   04/29/2023 Name: Alexis Farmer MRN: [id number] DOB: 07/05/1940  Alexis Farmer is a 82 y.o. year old female who sees [name], MD for primary care. I spoke with  [name] by phone today.  What matters to the patients health and wellness today?  Per chart review patient hospitalized from 04/01/23 to 04/02/23  for hyponatremia/ lower extremity weakness.  Per chart review patient transferred to [facility] on 04/02/23 with recent discharge.  Patient states she continue to have lower extremity weakness and pain.  She states a family member assisted her with getting upstairs into her home from SNF discharge.  She states she is not able to get out of her home at this time due to ongoing weakness. Patient states her long term plans consist of her moving in with her granddaughter in late 05/2023 where she will have care assistance and ability to get into and out of home easier.  Patient reports blood sugars continue to range from 200's-300's.  She states she is taking her scheduled insulin and sliding scale insulin as prescribed.  Patient provided virtual hospital follow up appointment information for 05/02/23 at 11 am.     Goals Addressed             This Visit's Progress    Patient Stated:  " Getting my diabetes under control and manage heart failure and chronic health conditions"       Interventions Today    Flowsheet Row Most Recent Value  Chronic Disease   Chronic disease during today's visit Other, Diabetes  [CAD, chronic venous insufficiency, Lower extremity weakness, falls]  General Interventions   General Interventions Discussed/Reviewed General Interventions Reviewed, Doctor Visits  [evaluation of current treatment plan for mentioned health conditions and patients adherence to plan as established by providers. Assessed for new/ ongoing symptoms. Assessed BS readings.]  Doctor Visits Discussed/Reviewed Doctor Visits Reviewed   [scheduled post hospital virtual visit appointment for patient on 05/02/23 at 11 am.  Patient informed that primary provider will need to be able to see and hear her on virtual call. Confirmed patient to use daughters phone for virtual visit.]  Exercise Interventions   Exercise Discussed/Reviewed Physical Activity  Physical Activity Discussed/Reviewed Physical Activity Reviewed  [Confirmed patient has been contacted by home health and therapy services have started.]  Education Interventions   Education Provided Provided Education  [advised to scheduled follow up specialist visits such as oncology, nephrology, cardiology.]  Provided Verbal Education On Blood Sugar Monitoring, When to see the doctor  [discussed when to call provider for symptoms. Discussed concerns regarding patient maintaining blood sugars 200-300's. Confirmed with patient she is taking scheduled and sliding scale insulin as prescribed.]  Nutrition Interventions   Nutrition Discussed/Reviewed Decreasing salt  Pharmacy Interventions   Pharmacy Dicussed/Reviewed Pharmacy Topics Reviewed  [medications reviewed/ discussed. Confirmed patient has all medications and takes them as prescribed.]  Safety Interventions   Safety Discussed/Reviewed Fall Risk, Safety Reviewed                 SDOH assessments and interventions completed:  Yes  SDOH Interventions Today    Flowsheet Row Most Recent Value  SDOH Interventions   Transportation Interventions Other (Comment)  [Uses transportation services. Patient contacts [employer] [employer] guide to set up transportation arrangements.]        Care Coordination Interventions:  Yes, provided   Follow up plan: Follow up call scheduled for 05/13/23 at 2 pm  Encounter Outcome:  Patient Visit Completed   [name] RN,BSN,CCM [facility]  [facility], [facility] coordinator / Case Manager Phone: [phone]

## 2023-04-29 NOTE — Patient Instructions (Signed)
Visit Information  Thank you for taking time to visit with me today. Please don't hesitate to contact me if I can be of assistance to you.   Following are the goals we discussed today:   Goals Addressed             This Visit's Progress    Patient Stated:  " Getting my diabetes under control and manage heart failure and chronic health conditions"       Interventions Today    Flowsheet Row Most Recent Value  Chronic Disease   Chronic disease during today's visit Other, Diabetes  [CAD, chronic venous insufficiency, Lower extremity weakness, falls]  General Interventions   General Interventions Discussed/Reviewed General Interventions Reviewed, Doctor Visits  [evaluation of current treatment plan for mentioned health conditions and patients adherence to plan as established by providers. Assessed for new/ ongoing symptoms. Assessed BS readings.]  Doctor Visits Discussed/Reviewed Doctor Visits Reviewed  [scheduled post hospital virtual visit appointment for patient on 05/02/23 at 11 am.  Patient informed that primary provider will need to be able to see and hear her on virtual call. Confirmed patient to use daughters phone for virtual visit.]  Exercise Interventions   Exercise Discussed/Reviewed Physical Activity  Physical Activity Discussed/Reviewed Physical Activity Reviewed  [Confirmed patient has been contacted by home health and therapy services have started.]  Education Interventions   Education Provided Provided Education  [advised to scheduled follow up specialist visits such as oncology, nephrology, cardiology.]  Provided Verbal Education On Blood Sugar Monitoring, When to see the doctor  [discussed when to call provider for symptoms. Discussed concerns regarding patient maintaining blood sugars 200-300's. Confirmed with patient she is taking scheduled and sliding scale insulin as prescribed.]  Nutrition Interventions   Nutrition Discussed/Reviewed Decreasing salt  Pharmacy  Interventions   Pharmacy Dicussed/Reviewed Pharmacy Topics Reviewed  [medications reviewed/ discussed. Confirmed patient has all medications and takes them as prescribed.]  Safety Interventions   Safety Discussed/Reviewed Fall Risk, Safety Reviewed                 Our next appointment is by telephone on 05/13/23 at 2 pm  Please call the care guide team at [phone] if you need to cancel or reschedule your appointment.   If you are experiencing a Mental Health or Behavioral Health Crisis or need someone to talk to, please call the Suicide and Crisis Lifeline: 988 call 1-800-273-TALK (toll free, 24 hour hotline)  The patient verbalized understanding of instructions, educational materials, and care plan provided today and agreed to receive a mailed copy of patient instructions, educational materials, and care plan.   [name] RN,BSN,CCM [place]  [facility], [facility] coordinator / Case Manager Phone: [phone]

## 2023-05-02 ENCOUNTER — Telehealth (INDEPENDENT_AMBULATORY_CARE_PROVIDER_SITE_OTHER): Payer: Medicare Other | Admitting: Family Medicine

## 2023-05-02 ENCOUNTER — Encounter: Payer: Self-pay | Admitting: Family Medicine

## 2023-05-02 VITALS — Ht 63.0 in | Wt 249.0 lb

## 2023-05-02 DIAGNOSIS — E1142 Type 2 diabetes mellitus with diabetic polyneuropathy: Secondary | ICD-10-CM

## 2023-05-02 DIAGNOSIS — M47816 Spondylosis without myelopathy or radiculopathy, lumbar region: Secondary | ICD-10-CM | POA: Diagnosis not present

## 2023-05-02 DIAGNOSIS — R29898 Other symptoms and signs involving the musculoskeletal system: Secondary | ICD-10-CM

## 2023-05-02 DIAGNOSIS — Z6841 Body Mass Index (BMI) 40.0 and over, adult: Secondary | ICD-10-CM

## 2023-05-02 DIAGNOSIS — G8929 Other chronic pain: Secondary | ICD-10-CM | POA: Diagnosis not present

## 2023-05-02 DIAGNOSIS — R269 Unspecified abnormalities of gait and mobility: Secondary | ICD-10-CM | POA: Diagnosis not present

## 2023-05-02 DIAGNOSIS — E1165 Type 2 diabetes mellitus with hyperglycemia: Secondary | ICD-10-CM

## 2023-05-02 DIAGNOSIS — R5381 Other malaise: Secondary | ICD-10-CM

## 2023-05-02 DIAGNOSIS — I890 Lymphedema, not elsewhere classified: Secondary | ICD-10-CM

## 2023-05-02 NOTE — Progress Notes (Signed)
Virtual Visit via Video note  I connected with Alexis Farmer on 05/10/23 at 1053 by video and verified that I am speaking with the correct person using two identifiers. Alexis Farmer is currently located at home and is currently with alone during visit. The provider, [name], MD is located in their office at time of visit.  I discussed the limitations, risks, security and privacy concerns of performing an evaluation and management service by video and the availability of in person appointments. I also discussed with the patient that there may be a patient responsible charge related to this service. The patient expressed understanding and agreed to proceed.  Subjective: PCP: [name], MD  Chief Complaint  Patient presents with   Hospitalization Follow-up    Fasting blood sugar was 200 wants a DME order  for a wide toilet seat and a shower chair.     HPI Discussed the use of AI scribe software for clinical note transcription with the patient, who gave verbal consent to proceed.  History of Present Illness The patient, recently discharged from a rehabilitation facility, continues to struggle with mobility, specifically walking and standing. They are currently receiving at-home physical therapy twice a week, focusing on these issues. The patient reports no recent falls since discharge from rehab two weeks ago, but does express a general feeling of weakness.  The patient was initially hospitalized due to multiple falls, one of which resulted in an inability to rise from the toilet and necessitated crawling to the living room for assistance. This incident led to a two-day hospital stay, followed by a 21-day stay in a rehabilitation facility.  The patient also reports difficulties with toileting and showering due to their mobility issues. They are unable to use a standard toilet due to difficulty standing from a seated position and cannot access their bathtub. They have requested a  prescription for a wider, high-based toilet seat and a shower chair to assist with these activities.  The patient's most recent blood work, conducted during their rehabilitation stay, revealed an elevated A1c of 10. They have not been able to follow up with their endocrinologist due to their mobility issues and have scheduled appointments for January when they plan to move to a more accessible living situation with their granddaughter.  The patient's blood glucose levels have occasionally risen to 300, but they have not discussed any medication changes with their endocrinologist. They are currently on their regular medications. They were previously on gabapentin for leg pain, but this was discontinued during their rehabilitation stay for reasons the patient is unsure of. The patient has expressed a desire for pain management, but is concerned about potential kidney damage. They had a negative experience with oxycodone, which caused hallucinations, and do not wish to take a similar medication.   ROS: Per HPI  Current Outpatient Medications:    acetaminophen (TYLENOL) 500 MG tablet, Take 500 mg by mouth every 6 (six) hours as needed for mild pain or headache., Disp: , Rfl:    Ascorbic Acid (VITAMIN C PO), Take 1 tablet by mouth daily., Disp: , Rfl:    aspirin EC 81 MG tablet, Take 81 mg by mouth 2 (two) times a week. Swallow whole., Disp: , Rfl:    blood glucose meter kit and supplies KIT, Accu chek, Dx code E11.65, check 3 times daily, Disp: 1 each, Rfl: 0   Blood Pressure KIT, 1 Device by Does not apply route daily., Disp: 1 kit, Rfl: 0   cephALEXin (KEFLEX)  500 MG capsule, Take 1 capsule (500 mg total) by mouth every 8 (eight) hours., Disp: , Rfl:    cloNIDine (CATAPRES) 0.2 MG tablet, Take 1 tablet (0.2 mg total) by mouth 2 (two) times daily., Disp: 270 tablet, Rfl: 3   glucose blood (ACCU-CHEK GUIDE) test strip, USE AS INSTRUCTED 3 TIMES DAILY, Disp: 300 strip, Rfl: 12   hydrALAZINE (APRESOLINE)  100 MG tablet, TAKE 1 TABLET (100 MG TOTAL) BY MOUTH 4 (FOUR) TIMES DAILY., Disp: 360 tablet, Rfl: 2   HYDROcodone-acetaminophen (NORCO/VICODIN) 5-325 MG tablet, Take 1 tablet by mouth every 4 (four) hours as needed for moderate pain (pain score 4-6) or severe pain (pain score 7-10)., Disp: 30 tablet, Rfl: 0   insulin NPH Human (NOVOLIN N) 100 UNIT/ML injection, INJECT 20 UNITS IN THE MORNING AND THE EVENING WITH A MEAL, Disp: 40 mL, Rfl: PRN   insulin regular (NOVOLIN R) 100 units/mL injection, Inject 0-0.08 mLs (0-8 Units total) into the skin 3 (three) times daily before meals., Disp: 30 mL, Rfl: 11   Insulin Syringe-Needle U-100 (BD INSULIN SYRINGE U/F) 30G X 1/2" 0.5 ML MISC, USE UP TO 5X PER DAY WITH INSULIN 2X (NPH) AND 3X (REGULAR), Disp: 500 each, Rfl: 12   LORazepam (ATIVAN) 1 MG tablet, Take 0.5 tablets (0.5 mg total) by mouth daily as needed for anxiety. Inform pt 0.5 dose on backorder has to cut this in 1/2 dose, Disp: 15 tablet, Rfl: 0   Multiple Vitamins-Minerals (CENTRUM SILVER 50+WOMEN) TABS, Take 1 tablet by mouth daily with breakfast., Disp: , Rfl:    nebivolol (BYSTOLIC) 10 MG tablet, TAKE 1 TABLET BY MOUTH EVERY DAY, Disp: 90 tablet, Rfl: 0   nystatin (MYCOSTATIN/NYSTOP) powder, Apply topically 2 (two) times daily. Under abdomen, Disp: 60 g, Rfl: 11   polyethylene glycol powder (GLYCOLAX/MIRALAX) 17 GM/SCOOP powder, Take 17 g by mouth daily as needed for moderate constipation or severe constipation. Can take up to 2x per day prn. Mix with 8 ounces of liquid, Disp: 3350 g, Rfl: 11   rosuvastatin (CRESTOR) 20 MG tablet, TAKE 1 TABLET BY MOUTH EVERY DAY, Disp: 90 tablet, Rfl: 3   spironolactone (ALDACTONE) 25 MG tablet, Take 25 mg by mouth 3 (three) times a week. Monday Wednesday and friday, Disp: , Rfl:    torsemide (DEMADEX) 10 MG tablet, Take 1 tablet (10 mg total) by mouth daily., Disp: 90 tablet, Rfl: 3   valsartan (DIOVAN) 160 MG tablet, TAKE 1 TABLET BY MOUTH EVERY DAY, Disp: 90  tablet, Rfl: 0  Observations/Objective: Physical Exam Pulmonary:     Effort: Pulmonary effort is normal.  Neurological:     Mental Status: She is alert and oriented to person, place, and time. Mental status is at baseline.  Psychiatric:        Mood and Affect: Mood normal.        Behavior: Behavior normal.        Thought Content: Thought content normal.        Judgment: Judgment normal.    Assessment and Plan: Morbid obesity with BMI of 45.0-49.9, adult (HCC) -     For home use only DME Shower stool -     For home use only DME Eelevated commode seat  Arthritis of lumbar spine -     For home use only DME Shower stool -     For home use only DME Eelevated commode seat  Other chronic pain -     For home use only DME Shower  stool -     For home use only DME Eelevated commode seat  Abnormal gait Assessment & Plan: Difficulty with walking and standing, requiring home physical therapy twice weekly. Recent history of falls leading to hospitalization and subsequent rehabilitation stay. -Continue home physical therapy twice weekly. -Order wide toilet seat and shower chair to assist with activities of daily living.  Orders: -     For home use only DME Shower stool -     For home use only DME Eelevated commode seat  Physical deconditioning -     For home use only DME Shower stool -     For home use only DME Eelevated commode seat  Weakness of both lower extremities -     For home use only DME Shower stool -     For home use only DME Eelevated commode seat  Lymphedema -     For home use only DME Shower stool -     For home use only DME Eelevated commode seat  Uncontrolled type 2 diabetes mellitus with hyperglycemia (HCC) Assessment & Plan: A1c of 10, indicating poor glycemic control. Patient has not been able to follow up with endocrinologist due to mobility issues. -Encourage patient to follow up with endocrinologist in January after moving to a more accessible living  situation.   Diabetic polyneuropathy associated with type 2 diabetes mellitus (HCC) Assessment & Plan: History of leg pain, previously treated with gabapentin, which was discontinued during rehabilitation stay. Patient expressed concern about potential kidney damage. -Review medical records showed discontinuation of gabapentin, likely to increased falls and gait disturbance. -Consider alternative pain management strategies, weight loss, diabetic control and consider pain management referral.      Follow Up Instructions: Return in about 3 months (around 08/02/2023).   I discussed the assessment and treatment plan with the patient. The patient was provided an opportunity to ask questions and all were answered. The patient agreed with the plan and demonstrated an understanding of the instructions.   The patient was advised to call back or seek an in-person evaluation if the symptoms worsen or if the condition fails to improve as anticipated.  The above assessment and management plan was discussed with the patient. The patient verbalized understanding of and has agreed to the management plan. Patient is aware to call the clinic if symptoms persist or worsen. Patient is aware when to return to the clinic for a follow-up visit. Patient educated on when it is appropriate to go to the emergency department.    [name], MD

## 2023-05-06 ENCOUNTER — Ambulatory Visit: Payer: Medicare Other | Admitting: Oncology

## 2023-05-06 ENCOUNTER — Other Ambulatory Visit: Payer: Medicare Other

## 2023-05-08 ENCOUNTER — Telehealth: Payer: Self-pay | Admitting: Family Medicine

## 2023-05-08 NOTE — Telephone Encounter (Signed)
Patient called and stated that she requested a toilet seat and shower chair awhile ago. She stated she needs it asap.

## 2023-05-09 NOTE — Telephone Encounter (Signed)
Patient states she is following-up on her request for a toilet seat and shower chair.  Patient states she needs these as soon as possible.  I let patient know that the request has been sent to Dr. [name].  I did let patient know that Dr. Clent Ridges is out of the office today.

## 2023-05-10 ENCOUNTER — Encounter: Payer: Self-pay | Admitting: Family Medicine

## 2023-05-10 NOTE — Assessment & Plan Note (Signed)
Difficulty with walking and standing, requiring home physical therapy twice weekly. Recent history of falls leading to hospitalization and subsequent rehabilitation stay. -Continue home physical therapy twice weekly. -Order wide toilet seat and shower chair to assist with activities of daily living.

## 2023-05-10 NOTE — Assessment & Plan Note (Signed)
History of leg pain, previously treated with gabapentin, which was discontinued during rehabilitation stay. Patient expressed concern about potential kidney damage. -Review medical records showed discontinuation of gabapentin, likely to increased falls and gait disturbance. -Consider alternative pain management strategies, weight loss, diabetic control and consider pain management referral.

## 2023-05-10 NOTE — Assessment & Plan Note (Signed)
A1c of 10, indicating poor glycemic control. Patient has not been able to follow up with endocrinologist due to mobility issues. -Encourage patient to follow up with endocrinologist in January after moving to a more accessible living situation.

## 2023-05-10 NOTE — Patient Instructions (Addendum)
It was a pleasure meeting you today. Thank you for allowing me to take part in your health care.  Our goals for today as we discussed include:  Prescriptions for wide toilet seat and shower seat to be faxed.  Please provide with place to have faxed.  Follow up with Endocrinology as scheduled Follow up with Oncology as scheduled  Follow up with PCP as needed in 3 months  This is a list of the screening recommended for you and due dates:  Health Maintenance  Topic Date Due   DTaP/Tdap/Td vaccine (1 - Tdap) Never done   Yearly kidney health urinalysis for diabetes  04/19/2022   Complete foot exam   11/25/2022   COVID-19 Vaccine (5 - 2023-24 season) 02/17/2023   Hemoglobin A1C  04/24/2023   Flu Shot  09/16/2023*   Medicare Annual Wellness Visit  11/29/2023   Eye exam for diabetics  03/04/2024   Yearly kidney function blood test for diabetes  04/01/2024   Pneumonia Vaccine  Completed   Zoster (Shingles) Vaccine  Completed   HPV Vaccine  Aged Out   DEXA scan (bone density measurement)  Discontinued  *Topic was postponed. The date shown is not the original due date.    If you have any questions or concerns, please do not hesitate to call the office at [phone].  I look forward to our next visit and until then take care and stay safe.  Regards,   [name], MD   [facility]

## 2023-05-13 ENCOUNTER — Ambulatory Visit: Payer: Self-pay

## 2023-05-13 NOTE — Patient Outreach (Signed)
Care Coordination   Follow Up Visit Note   05/13/2023 Name: Alexis Farmer MRN: [id number] DOB: 03/25/1941  Alexis Farmer is a 82 y.o. year old female who sees [name], MD for primary care. I spoke with  [name] by phone today.  What matters to the patients health and wellness today?  Patient reports having recent follow up visit with primary care provider.  Patient states she did not reschedule for a follow up appointment because she would like to find a new primary care provider. Patient states her primary provider gave her order for a elevated toilet seat and shower chair/ bench.  Patient requested RNCM to provide contact phone number for a few provider offices near her.  Patient reports blood sugar range from 150's-300's.  She states she will have an occasional low blood sugar reading in the 70's or 90's.  Patient states she does not like her blood sugars to get that low.  Patient states she is aware that when she treats her lower blood sugar it may cause it to increase higher than necessary.  Patient states she is not eating much. She states sometimes she will eat a peanut butter sandwich or a can of soup. She states she doesn't cook on her stove anymore because she's had occasion of forgetting she had it on.  Patient states her family also brings her food.  Patient states she plans on moving in with her granddaughter in January 2025 where she will have additional help with her care.  Patient returned call to [facility] to report her Medicare insurance will not cover the shower chair/ bench or elevated toilet seat.    Goals Addressed             This Visit's Progress    Patient Stated:  " Getting my diabetes under control and manage heart failure and chronic health conditions"       Interventions Today    Flowsheet Row Most Recent Value  Chronic Disease   Chronic disease during today's visit Diabetes, Other  [chronic venous insufficiency, lower extremity weakness, falls]   General Interventions   General Interventions Discussed/Reviewed General Interventions Reviewed, Durable Medical Equipment (DME)  [evaluation of current treatment plan for mentioned health conditions and patients adherence to plans as established by provider. Assessed for BS readings and for any new/ ongoing symptoms.]  Doctor Visits Discussed/Reviewed Doctor Visits Reviewed  [providing assistance with finding new primary care provider as requested by patient. Reviewed upcoming provider visit appointments.]  Durable Medical Equipment (DME) Shower bench, Other  [elevated toilet seat.  Provided patient contact phone numbers for DME providers in her home area.]  Exercise Interventions   Exercise Discussed/Reviewed Physical Activity  Physical Activity Discussed/Reviewed Physical Activity Reviewed  [confirmed patient still receiving home PT visits.  Encouraged patient to do daily exercises provided by PT.]  Education Interventions   Education Provided Provided Education  Provided Verbal Education On Blood Sugar Monitoring  [Discussed management of hyperglycemic/ hypoglycemic events. advised patient to continue monitoring BS 1-2 times per day and reports frequent  BS <70 or >250 pt provider.  Confirmed patient has scheduled follow up endocrinology appointment]  Nutrition Interventions   Nutrition Discussed/Reviewed Nutrition Reviewed  Pharmacy Interventions   Pharmacy Dicussed/Reviewed Pharmacy Topics Reviewed  [advised to take medications as prescribed.]  Safety Interventions   Safety Discussed/Reviewed Fall Risk  [assessed for falls.]                 SDOH assessments and interventions completed:  No     Care Coordination Interventions:  Yes, provided   Follow up plan: Follow up call scheduled for 06/11/23    Encounter Outcome:  Patient Visit Completed   [name] RN,BSN,CCM [facility]  [facility], [facility] coordinator / Case Manager Phone:  [phone]

## 2023-05-13 NOTE — Telephone Encounter (Signed)
Patient just called back and she said her insurance will not pay for her order. She doesn't want it anymore. Her number is [phone]. If you have any questions.

## 2023-05-13 NOTE — Telephone Encounter (Signed)
Patient just called and said she will pick up her order tomorrow.

## 2023-05-13 NOTE — Telephone Encounter (Signed)
Noted  

## 2023-05-13 NOTE — Patient Instructions (Signed)
Visit Information  Thank you for taking time to visit with me today. Please don't hesitate to contact me if I can be of assistance to you.   Following are the goals we discussed today:   Goals Addressed             This Visit's Progress    Patient Stated:  " Getting my diabetes under control and manage heart failure and chronic health conditions"       Interventions Today    Flowsheet Row Most Recent Value  Chronic Disease   Chronic disease during today's visit Diabetes, Other  [chronic venous insufficiency, lower extremity weakness, falls]  General Interventions   General Interventions Discussed/Reviewed General Interventions Reviewed, Durable Medical Equipment (DME)  [evaluation of current treatment plan for mentioned health conditions and patients adherence to plans as established by provider. Assessed for BS readings and for any new/ ongoing symptoms.]  Doctor Visits Discussed/Reviewed Doctor Visits Reviewed  [providing assistance with finding new primary care provider as requested by patient. Reviewed upcoming provider visit appointments.]  Durable Medical Equipment (DME) Shower bench, Other  [elevated toilet seat.  Provided patient contact phone numbers for DME providers in her home area.]  Exercise Interventions   Exercise Discussed/Reviewed Physical Activity  Physical Activity Discussed/Reviewed Physical Activity Reviewed  [confirmed patient still receiving home PT visits.  Encouraged patient to do daily exercises provided by PT.]  Education Interventions   Education Provided Provided Education  Provided Verbal Education On Blood Sugar Monitoring  [Discussed management of hyperglycemic/ hypoglycemic events. advised patient to continue monitoring BS 1-2 times per day and reports frequent  BS <70 or >250 pt provider.  Confirmed patient has scheduled follow up endocrinology appointment]  Nutrition Interventions   Nutrition Discussed/Reviewed Nutrition Reviewed  Pharmacy  Interventions   Pharmacy Dicussed/Reviewed Pharmacy Topics Reviewed  [advised to take medications as prescribed.]  Safety Interventions   Safety Discussed/Reviewed Fall Risk  [assessed for falls.]                 Our next appointment is by telephone on 06/11/23 at 10 am  Please call the care guide team at [phone] if you need to cancel or reschedule your appointment.   If you are experiencing a Mental Health or Behavioral Health Crisis or need someone to talk to, please call the Suicide and Crisis Lifeline: 988 call 1-800-273-TALK (toll free, 24 hour hotline)  The patient verbalized understanding of instructions, educational materials, and care plan provided today and agreed to receive a mailed copy of patient instructions, educational materials, and care plan.   [name] RN,BSN,CCM [place]  [facility], [facility] coordinator / Case Manager Phone: [phone]

## 2023-05-13 NOTE — Telephone Encounter (Signed)
Called and spoke with pt letting her know that orders are ready.  She did not have a DME company in mind and will have her daughter pick them up in the next few days.  Orders placed in an envelope up front for pick up.

## 2023-05-22 ENCOUNTER — Telehealth: Payer: Self-pay

## 2023-05-22 ENCOUNTER — Telehealth: Payer: Self-pay | Admitting: Family Medicine

## 2023-05-22 NOTE — Telephone Encounter (Signed)
Prescription Request  05/22/2023  LOV: Visit date not found  What is the name of the medication or equipment? glucose blood (ACCU-CHEK GUIDE) test strip and Insulin Syringe-Needle U-100 (BD INSULIN SYRINGE U/F) 30G X 1/2" 0.5 ML MISC  Have you contacted your pharmacy to request a refill? Yes   Which pharmacy would you like this sent to?  [id number] [place], [place] - [address] [place] [address] Phone: [phone] Fax: [phone]    Patient notified that their request is being sent to the clinical staff for review and that they should receive a response within 2 business days.   Please advise at Mobile [phone] (mobile)

## 2023-05-22 NOTE — Telephone Encounter (Signed)
Type of forms received: HH forms  Routed to: [facility]  Paperwork received by : Audree Camel   Individual made aware of 3-5 business day turn around (Y/N): Y   Form completed and patient made aware of charges(Y/N): Y    Faxed to : jason requested ppw be faxed to [phone]. Barbara Cower also requested a call back once ppw is comp @ [phone], secured   Form location:  [facility] folder

## 2023-05-22 NOTE — Telephone Encounter (Signed)
Called and informed pt that she has refills at the pharmacy.

## 2023-05-23 NOTE — Telephone Encounter (Signed)
Placed in provider box

## 2023-05-27 DIAGNOSIS — M79662 Pain in left lower leg: Secondary | ICD-10-CM | POA: Diagnosis not present

## 2023-05-27 DIAGNOSIS — G8929 Other chronic pain: Secondary | ICD-10-CM | POA: Diagnosis not present

## 2023-05-27 DIAGNOSIS — F32A Depression, unspecified: Secondary | ICD-10-CM

## 2023-05-27 DIAGNOSIS — N1831 Chronic kidney disease, stage 3a: Secondary | ICD-10-CM

## 2023-05-27 DIAGNOSIS — F419 Anxiety disorder, unspecified: Secondary | ICD-10-CM

## 2023-05-27 DIAGNOSIS — I5032 Chronic diastolic (congestive) heart failure: Secondary | ICD-10-CM

## 2023-05-27 DIAGNOSIS — Z6841 Body Mass Index (BMI) 40.0 and over, adult: Secondary | ICD-10-CM

## 2023-05-27 DIAGNOSIS — E1122 Type 2 diabetes mellitus with diabetic chronic kidney disease: Secondary | ICD-10-CM | POA: Diagnosis not present

## 2023-05-27 DIAGNOSIS — E871 Hypo-osmolality and hyponatremia: Secondary | ICD-10-CM

## 2023-05-27 DIAGNOSIS — I130 Hypertensive heart and chronic kidney disease with heart failure and stage 1 through stage 4 chronic kidney disease, or unspecified chronic kidney disease: Secondary | ICD-10-CM | POA: Diagnosis not present

## 2023-05-27 DIAGNOSIS — E1151 Type 2 diabetes mellitus with diabetic peripheral angiopathy without gangrene: Secondary | ICD-10-CM

## 2023-05-28 NOTE — Telephone Encounter (Signed)
Faxed forms

## 2023-06-06 ENCOUNTER — Telehealth: Payer: Self-pay

## 2023-06-06 NOTE — Patient Outreach (Signed)
  Care Coordination   Follow Up Visit Note   06/06/2023 Name: Alexis Farmer MRN: [id number] DOB: 1940-11-08  Alexis Farmer is a 82 y.o. year old female who sees Alexis Allan, MD for primary care. I spoke with  Alexis Farmer by phone today.  What matters to the patients health and wellness today?  Patient requesting assistance with getting a new primary care provider.  Patient states she would like to know if any of the female doctors at [facility] are taking new patients.  Contacted  Practice leader with Alexis Farmer to ask if female doctors taking new patients.  Advised that one provider is not taking new patients and other provider is unable to take new patient over age 43 at this time.  Patient notified and provided contact phone number for [name] health physician referral line.  Patient also advised to contact her current transportation provider to see if she is able to transfer her service to her new address once moved.    Goals Addressed             This Visit's Progress    Assistance with obtaining new primary care provider.       Interventions Today    Flowsheet Row Most Recent Value  General Interventions   General Interventions Discussed/Reviewed General Interventions Reviewed  [Contact [facility] to see if their female providers are taking any new patients.  Patient provided contact phone number for the [facility] physician referral line: [phone].]              SDOH assessments and interventions completed:  No     Care Coordination Interventions:  Yes, provided   Follow up plan: Follow up call scheduled for as previously scheduled     Encounter Outcome:  Patient Visit Completed   Alexis Ina RN,BSN,CCM [facility]  [facility], [facility] coordinator / Case Manager Phone: [phone]

## 2023-06-11 ENCOUNTER — Ambulatory Visit: Payer: Self-pay

## 2023-06-11 NOTE — Patient Outreach (Signed)
Care Coordination   Follow Up Visit Note   06/11/2023 Name: Alexis Farmer MRN: [id number] DOB: 06/24/1940  Alexis Farmer is a 82 y.o. year old female who sees [name], MD for primary care. I spoke with  [name] by phone today.  What matters to the patients health and wellness today?  Patient states her plans are to move in with her granddaughter on 07/12/22.  Patient states she has decided to stay with her current primary care provider for now. Patient reports her blood sugar today was 130.  She states blood sugars range from 100-300. She states she likes her blood sugar to be a little higher prior to bed because she is concerned it may drop to low during the night.  Patient reports home PT has ended. She states she is still doing the home instructed PT exercises.  She states she continues to have bilateral leg weakness with left leg being worse.  She denies having any falls and states she uses her walker when ambulating.   Patient states she always has some swelling in her legs. She states this is normal for her.  Patient reports her last time applying for Medicaid was 2 years ago.   Goals Addressed             This Visit's Progress    Patient Stated:  " Getting my diabetes under control and manage heart failure and chronic health conditions"       Interventions Today    Flowsheet Row Most Recent Value  Chronic Disease   Chronic disease during today's visit Diabetes, Congestive Heart Failure (CHF), Other  [bilateral leg weakness/ falls.]  General Interventions   General Interventions Discussed/Reviewed General Interventions Reviewed, Doctor Visits  [evaluation of current treatment plan for mentioned health conditions and patient adherence to plan as advised by provider. Assessed for BS readings/ heart failure symptoms]  Doctor Visits Discussed/Reviewed Doctor Visits Reviewed  Annabell Sabal upcoming provider visits. Inquired if patient has chosen new primary care provider.]   Exercise Interventions   Exercise Discussed/Reviewed Physical Activity  [encouraged patient to continue home health PT instructed exercises.  Discussed importance of staying active.]  Education Interventions   Education Provided Provided Education  [reviewed importance of managing diabetes and keeping blood sugars within normal range.  Discussed how to manage hypoglycemic/ hyperglycemic events. Advised to notify provider for frequent blood sugars <70. Reviewed HF signs/ symptoms.]  Provided Verbal Education On Other  [Advised to contact DSS for Medicaid application.  Contact phone number for DSS provided. Advised patient she can purchase BP monitor/ scales from Amazon/ drug store/ [employer] at low price. Advised to comparison shop. Advised to elevate legs when sitting.]  Nutrition Interventions   Nutrition Discussed/Reviewed Nutrition Reviewed, Carbohydrate meal planning, Decreasing sugar intake  [Discussed using the plate method for managing diet.]  Pharmacy Interventions   Pharmacy Dicussed/Reviewed Pharmacy Topics Reviewed  [medications reviewed and compliance discussed and advised. Advised  to contact provider and/ or RNCM for questions or concerns.]  Safety Interventions   Safety Discussed/Reviewed Fall Risk  [Assessed for falls. Advised to use ambulatory device for safety.]                   SDOH assessments and interventions completed:  No     Care Coordination Interventions:  Yes, provided   Follow up plan: Follow up call scheduled for 07/29/23    Encounter Outcome:  Patient Visit Completed   [name] RN,BSN,CCM [facility]  [facility], Population  Health Care coordinator / Case Manager Phone: [phone]

## 2023-06-11 NOTE — Patient Instructions (Signed)
Visit Information  Thank you for taking time to visit with me today. Please don't hesitate to contact me if I can be of assistance to you.   Following are the goals we discussed today:   Goals Addressed             This Visit's Progress    Patient Stated:  " Getting my diabetes under control and manage heart failure and chronic health conditions"       Interventions Today    Flowsheet Row Most Recent Value  Chronic Disease   Chronic disease during today's visit Diabetes, Congestive Heart Failure (CHF), Other  [bilateral leg weakness/ falls.]  General Interventions   General Interventions Discussed/Reviewed General Interventions Reviewed, Doctor Visits  [evaluation of current treatment plan for mentioned health conditions and patient adherence to plan as advised by provider. Assessed for BS readings/ heart failure symptoms]  Doctor Visits Discussed/Reviewed Doctor Visits Reviewed  Annabell Sabal upcoming provider visits. Inquired if patient has chosen new primary care provider.]  Exercise Interventions   Exercise Discussed/Reviewed Physical Activity  [encouraged patient to continue home health PT instructed exercises.  Discussed importance of staying active.]  Education Interventions   Education Provided Provided Education  [reviewed importance of managing diabetes and keeping blood sugars within normal range.  Discussed how to manage hypoglycemic/ hyperglycemic events. Advised to notify provider for frequent blood sugars <70. Reviewed HF signs/ symptoms.]  Provided Verbal Education On Other  [Advised to contact DSS for Medicaid application.  Contact phone number for DSS provided. Advised patient she can purchase BP monitor/ scales from Amazon/ drug store/ [employer]mart at low price. Advised to comparison shop. Advised to elevate legs when sitting.]  Nutrition Interventions   Nutrition Discussed/Reviewed Nutrition Reviewed, Carbohydrate meal planning, Decreasing sugar intake  [Discussed using the  plate method for managing diet.]  Pharmacy Interventions   Pharmacy Dicussed/Reviewed Pharmacy Topics Reviewed  [medications reviewed and compliance discussed and advised. Advised  to contact provider and/ or RNCM for questions or concerns.]  Safety Interventions   Safety Discussed/Reviewed Fall Risk  [Assessed for falls. Advised to use ambulatory device for safety.]                   Our next appointment is by telephone on 07/29/23 at 10 am  Please call the care guide team at [phone] if you need to cancel or reschedule your appointment.   If you are experiencing a Mental Health or Behavioral Health Crisis or need someone to talk to, please call the Suicide and Crisis Lifeline: 988 call 1-800-273-TALK (toll free, 24 hour hotline)  The patient verbalized understanding of instructions, educational materials, and care plan provided today and agreed to receive a mailed copy of patient instructions, educational materials, and care plan.   [name] RN,BSN,CCM [place]  [facility] coordinator / Case Manager Phone: [phone]

## 2023-06-17 ENCOUNTER — Other Ambulatory Visit: Payer: Self-pay | Admitting: Family Medicine

## 2023-06-17 DIAGNOSIS — F410 Panic disorder [episodic paroxysmal anxiety] without agoraphobia: Secondary | ICD-10-CM

## 2023-06-17 DIAGNOSIS — F39 Unspecified mood [affective] disorder: Secondary | ICD-10-CM

## 2023-06-21 ENCOUNTER — Telehealth: Payer: Self-pay | Admitting: Cardiovascular Disease

## 2023-06-21 MED ORDER — VALSARTAN 160 MG PO TABS: 160.0000 mg | ORAL_TABLET | Freq: Every day | ORAL | 0 refills | Status: DC

## 2023-06-21 NOTE — Telephone Encounter (Signed)
*  STAT* If patient is at the pharmacy, call can be transferred to refill team.   1. Which medications need to be refilled? (please list name of each medication and dose if known) valsartan  (DIOVAN ) 160 MG tablet    4. Which pharmacy/location (including street and city if local pharmacy) is medication to be sent to? [id number] - [place],  - [address]     5. Do they need a 30 day or 90 day supply? 90

## 2023-06-27 ENCOUNTER — Other Ambulatory Visit: Payer: Self-pay | Admitting: Cardiovascular Disease

## 2023-06-27 ENCOUNTER — Other Ambulatory Visit: Payer: Self-pay | Admitting: Family Medicine

## 2023-06-27 ENCOUNTER — Other Ambulatory Visit: Payer: Self-pay | Admitting: Family

## 2023-06-27 DIAGNOSIS — F39 Unspecified mood [affective] disorder: Secondary | ICD-10-CM

## 2023-06-27 DIAGNOSIS — E1159 Type 2 diabetes mellitus with other circulatory complications: Secondary | ICD-10-CM

## 2023-06-27 DIAGNOSIS — F410 Panic disorder [episodic paroxysmal anxiety] without agoraphobia: Secondary | ICD-10-CM

## 2023-06-27 DIAGNOSIS — R002 Palpitations: Secondary | ICD-10-CM

## 2023-06-27 MED ORDER — LORAZEPAM 1 MG PO TABS
0.5000 mg | ORAL_TABLET | Freq: Every day | ORAL | 0 refills | Status: DC | PRN
Start: 2023-06-27 — End: 2023-06-27

## 2023-06-27 MED ORDER — LORAZEPAM 1 MG PO TABS
0.5000 mg | ORAL_TABLET | Freq: Every day | ORAL | 0 refills | Status: DC | PRN
Start: 2023-06-27 — End: 2023-09-03

## 2023-06-27 NOTE — Telephone Encounter (Signed)
 Copied from CRM [id number]. Topic: Clinical - Medication Refill >> Jun 27, 2023  8:59 AM Joanell NOVAK wrote: Most Recent Primary Care Visit:  Provider: WALSH, TANYA  Department: [facility]  Visit Type: RHONA VIDEO VISIT  Date: 05/02/2023  Medication: ***  Has the patient contacted their pharmacy?  (Agent: If no, request that the patient contact the pharmacy for the refill. If patient does not wish to contact the pharmacy document the reason why and proceed with request.) (Agent: If yes, when and what did the pharmacy advise?)  Is this the correct pharmacy for this prescription?  If no, delete pharmacy and type the correct one.  This is the patient's preferred pharmacy:  [facility] [address], [place] - [address] [place] [address] Phone: [phone] Fax: [phone]   Has the prescription been filled recently?   Is the patient out of the medication?   Has the patient been seen for an appointment in the last year OR does the patient have an upcoming appointment?   Can we respond through MyChart?   Agent: Please be advised that Rx refills may take up to 3 business days. We ask that you follow-up with your pharmacy.

## 2023-06-27 NOTE — Telephone Encounter (Signed)
 Refilled: 06/17/2023 Last OV: 05/02/2023 Next OV: not scheduled

## 2023-07-08 ENCOUNTER — Ambulatory Visit: Payer: Medicare Other | Admitting: Oncology

## 2023-07-08 ENCOUNTER — Other Ambulatory Visit: Payer: Medicare Other

## 2023-07-10 ENCOUNTER — Emergency Department (HOSPITAL_COMMUNITY)
Admission: EM | Admit: 2023-07-10 | Discharge: 2023-07-10 | Disposition: A | Discharge status: Home / Self Care | Payer: Medicare Other | Attending: Emergency Medicine | Admitting: Emergency Medicine

## 2023-07-10 ENCOUNTER — Encounter (HOSPITAL_COMMUNITY): Payer: Self-pay

## 2023-07-10 ENCOUNTER — Other Ambulatory Visit: Payer: Self-pay

## 2023-07-10 DIAGNOSIS — Z794 Long term (current) use of insulin: Secondary | ICD-10-CM | POA: Diagnosis not present

## 2023-07-10 DIAGNOSIS — R600 Localized edema: Secondary | ICD-10-CM | POA: Diagnosis not present

## 2023-07-10 DIAGNOSIS — Z79899 Other long term (current) drug therapy: Secondary | ICD-10-CM | POA: Insufficient documentation

## 2023-07-10 DIAGNOSIS — Z7982 Long term (current) use of aspirin: Secondary | ICD-10-CM | POA: Insufficient documentation

## 2023-07-10 DIAGNOSIS — R531 Weakness: Secondary | ICD-10-CM | POA: Insufficient documentation

## 2023-07-10 DIAGNOSIS — E66813 Obesity, class 3: Secondary | ICD-10-CM | POA: Insufficient documentation

## 2023-07-10 DIAGNOSIS — I10 Essential (primary) hypertension: Secondary | ICD-10-CM | POA: Insufficient documentation

## 2023-07-10 DIAGNOSIS — R739 Hyperglycemia, unspecified: Secondary | ICD-10-CM | POA: Diagnosis not present

## 2023-07-10 LAB — COMPREHENSIVE METABOLIC PANEL
ALT: 20 U/L (ref 0–44)
AST: 29 U/L (ref 15–41)
Albumin: 3.2 g/dL — ABNORMAL LOW (ref 3.5–5.0)
Alkaline Phosphatase: 55 U/L (ref 38–126)
Anion gap: 10 (ref 5–15)
BUN: 17 mg/dL (ref 8–23)
CO2: 23 mmol/L (ref 22–32)
Calcium: 9.3 mg/dL (ref 8.9–10.3)
Chloride: 103 mmol/L (ref 98–111)
Creatinine, Ser: 1.45 mg/dL — ABNORMAL HIGH (ref 0.44–1.00)
GFR, Estimated: 36 mL/min — ABNORMAL LOW (ref >60)
Glucose, Bld: 377 mg/dL — ABNORMAL HIGH (ref 70–99)
Potassium: 4.1 mmol/L (ref 3.5–5.1)
Sodium: 136 mmol/L (ref 135–145)
Total Bilirubin: 1 mg/dL (ref 0.0–1.2)
Total Protein: 6.6 g/dL (ref 6.5–8.1)

## 2023-07-10 LAB — URINALYSIS, ROUTINE W REFLEX MICROSCOPIC
Bilirubin Urine: NEGATIVE
Glucose, UA: >=500 mg/dL — AB
Ketones, ur: NEGATIVE mg/dL
Leukocytes,Ua: NEGATIVE
Nitrite: NEGATIVE
Protein, ur: >=300 mg/dL — AB
Specific Gravity, Urine: 1.015 (ref 1.005–1.030)
pH: 5 (ref 5.0–8.0)

## 2023-07-10 LAB — CBC
HCT: 38.6 % (ref 36.0–46.0)
Hemoglobin: 12.1 g/dL (ref 12.0–15.0)
MCH: 28.9 pg (ref 26.0–34.0)
MCHC: 31.3 g/dL (ref 30.0–36.0)
MCV: 92.3 fL (ref 80.0–100.0)
Platelets: 433 10*3/uL — ABNORMAL HIGH (ref 150–400)
RBC: 4.18 MIL/uL (ref 3.87–5.11)
RDW: 13.1 % (ref 11.5–15.5)
WBC: 6.5 10*3/uL (ref 4.0–10.5)
nRBC: 0 % (ref 0.0–0.2)

## 2023-07-10 LAB — CBG MONITORING, ED: Glucose-Capillary: 198 mg/dL — ABNORMAL HIGH (ref 70–99)

## 2023-07-10 MED ORDER — ACETAMINOPHEN 500 MG PO TABS
1000.0000 mg | ORAL_TABLET | Freq: Once | ORAL | Status: DC
  Filled 2023-07-10: qty 2

## 2023-07-10 NOTE — ED Notes (Signed)
PT refused her tylenol states she will take it when she gets home.

## 2023-07-10 NOTE — Discharge Instructions (Addendum)
Thank you for coming to [facility] Emergency Department. You were seen for weakness, high blood sugar. We performed labs and discussed with social work who has helped [place] set up home health for you. Please follow up with your primary care provider within 1 week.   Do not hesitate to return to the ED or call 911 if you experience: -Worsening symptoms -Lightheadedness, passing out -Fevers/chills -Anything else that concerns you

## 2023-07-10 NOTE — ED Provider Notes (Signed)
3:57 PM Assumed care of patient from off-going team. For more details, please see note from same day.  In brief, this is a 83 y.o. female with worsening deconditioning, generalized weakness. Slid down to floor from toilet today and called EMS. Initially sugar was >500 but pt gave herself her sugar while she was still at home and BG in 300s now. Had plans to move in with her daughter next week.   Plan/Dispo at time of sign-out & ED Course since sign-out: [ ]  UA [ ]  CBG [ ]  case manager coming up with plan  BP (!) 174/71   Pulse 80   Temp 98.4 F (36.9 C) (Oral)   Resp 20   SpO2 100%    ED Course:   Clinical Course as of 07/14/23 1252  Wed Jul 10, 2023  1749 Glucose-Capillary(!): 198 Improved blood sugar  [HN]  1749 Urinalysis, Routine w reflex microscopic -Urine, Catheterized(!) No UTI [HN]    Clinical Course User Index [HN] Loetta Rough, MD   Discussed with patient and her daughter. Patient is extremely upset because she had wanted a LLE XR and did not receive one today.  She states that so she came to the emergency department for and she is very upset she did not receive 1.  On physical exam she has no deformities over the left lower extremity, no significant areas of point tenderness.  She states the entire left lower extremity is sore.  She states she denies any trauma to the leg.  I related the patient that likely she has no bony injury that would be noted on x-ray.  I discussed with the patient about possibly having a left lower extremity DVT ultrasound but she declines, stating that she would just like to leave the emergency department today.  Case manager had talked to the daughter, and they are working on placement for the patient.  Patient will have home health in the meantime.  Ordered bariatric bedside commode.  Patient be discharged with discharge instructions return precautions, instructed to follow-up with PCP within 1 to 2 weeks for her hyperglycemia, left lower  extremity pain.    Dispo: DC ------------------------------- Vivi Barrack, MD Emergency Medicine  This note was created using dictation software, which may contain spelling or grammatical errors.   Loetta Rough, MD 07/14/23 1254

## 2023-07-10 NOTE — ED Triage Notes (Signed)
Pt BIB EMS from home for chronic and worsening weakness to her left leg, patient was using the restroom and couldn't stand up, so she slid off the toilet and dragged herself to the living room and called family, and family called EMS.   Walker and wheelchair at home.

## 2023-07-10 NOTE — Care Management (Signed)
ED RNCM  received TOC consult for HH/DME. Met with patient and family at bedside to discuss [facility] recommendations. Patient reports that she was discharge from [facility] back in September 2024 and was sent home with HHPT referenced information in Patient Alexis Farmer with [name]. Patient is agreeable to [name] but did not want the same company. Discussed the preferred companies Alexis Farmer was contacted and can accept the referral. Patient has also express that she will be moving in with daughter  Alexis Farmer [phone] in [name]. Patient is requesting a bariatic bedside commode, order placed and [facility] made aware. Patient will be discharged home with [facility] services and she states, she unable to ambulate so [facility] has been notified for transport. Updated EDP and ED Nursing staff

## 2023-07-10 NOTE — ED Provider Notes (Signed)
[place] EMERGENCY DEPARTMENT AT [facility] Provider Note   CSN: [id number] Arrival date & time: 07/10/23  1124     History  Chief Complaint  Patient presents with   Extremity Weakness    Left leg    Alexis Farmer is a 83 y.o. female.  Pt arrives via EMS after indicates was not able to get up from sitting position b/c legs too weak. Indicates is not a new issue, and that once she is able to stand w walker she is ok to get around, but that she struggles transitioning from sitting position to standing. Today, slid from toilet to ground, then was able to crawl to another room and called family who called EMS for assistance. Denies trauma or injury. No new pain. EMS notes blood sugar in 500s and bp also high. Pt indicates did take her meds this AM. Denies fever/chills. No headache. No chest pain or sob. No abd pain or nvd. No new neck or back pain. No new leg numbness or weakness.   The history is provided by the patient, the EMS personnel and medical records.  Extremity Weakness Pertinent negatives include no chest pain, no abdominal pain, no headaches and no shortness of breath.       Home Medications Prior to Admission medications   Medication Sig Start Date End Date Taking? Authorizing Provider  acetaminophen (TYLENOL) 500 MG tablet Take 500 mg by mouth every 6 (six) hours as needed for mild pain or headache.    [provider]  Ascorbic Acid (VITAMIN C PO) Take 1 tablet by mouth daily.    [provider]  aspirin EC 81 MG tablet Take 81 mg by mouth 2 (two) times a week. Swallow whole.    [provider]  blood glucose meter kit and supplies KIT Accu chek, Dx code E11.65, check 3 times daily 07/26/17   Glori Luis, MD  Blood Pressure KIT 1 Device by Does not apply route daily. 10/24/21   McLean-Scocuzza, Pasty Spillers, MD  cephALEXin (KEFLEX) 500 MG capsule Take 1 capsule (500 mg total) by mouth every 8 (eight) hours. 04/03/23   Jonah Blue, MD  cloNIDine (CATAPRES) 0.2 MG tablet Take 1 tablet (0.2 mg total) by mouth 2 (two) times daily. 12/18/21   Antonieta Iba, MD  glucose blood (ACCU-CHEK GUIDE) test strip USE AS INSTRUCTED 3 TIMES DAILY 09/06/22   [name], MD  hydrALAZINE (APRESOLINE) 100 MG tablet TAKE 1 TABLET (100 MG TOTAL) BY MOUTH 4 (FOUR) TIMES DAILY. 09/11/22   Antonieta Iba, MD  HYDROcodone-acetaminophen (NORCO/VICODIN) 5-325 MG tablet Take 1 tablet by mouth every 4 (four) hours as needed for moderate pain (pain score 4-6) or severe pain (pain score 7-10). Patient not taking: Reported on 06/11/2023 04/02/23   Jonah Blue, MD  insulin NPH Human (NOVOLIN N) 100 UNIT/ML injection INJECT 20 UNITS IN THE MORNING AND THE EVENING WITH A MEAL 04/10/21   McLean-Scocuzza, Pasty Spillers, MD  insulin regular (NOVOLIN R) 100 units/mL injection Inject 0-0.08 mLs (0-8 Units total) into the skin 3 (three) times daily before meals. 10/17/20   Romero Belling, MD  Insulin Syringe-Needle U-100 (BD INSULIN SYRINGE U/F) 30G X 1/2" 0.5 ML MISC USE UP TO 5X PER DAY WITH INSULIN 2X (NPH) AND 3X (REGULAR) 12/18/22   [name], MD  LORazepam (ATIVAN) 1 MG tablet Take 0.5 tablets (0.5 mg total) by mouth daily as needed for anxiety. Inform pt 0.5 dose on backorder has to cut  this in 1/2 dose 06/27/23   Worthy Rancher B, FNP  Multiple Vitamins-Minerals (CENTRUM SILVER 50+WOMEN) TABS Take 1 tablet by mouth daily with breakfast.    [provider]  nebivolol (BYSTOLIC) 10 MG tablet TAKE 1 TABLET BY MOUTH EVERY DAY 06/27/23   Antonieta Iba, MD  nystatin (MYCOSTATIN/NYSTOP) powder Apply topically 2 (two) times daily. Under abdomen 02/04/23   [name], MD  polyethylene glycol powder (GLYCOLAX/MIRALAX) 17 GM/SCOOP powder Take 17 g by mouth daily as needed for moderate constipation or severe constipation. Can take up to 2x per day prn. Mix with 8 ounces of liquid 03/21/22   McLean-Scocuzza, Pasty Spillers, MD  rosuvastatin (CRESTOR) 20 MG tablet  TAKE 1 TABLET BY MOUTH EVERY DAY 03/21/22   McLean-Scocuzza, Pasty Spillers, MD  spironolactone (ALDACTONE) 25 MG tablet Take 25 mg by mouth 3 (three) times a week. Monday Wednesday and friday    [provider]  torsemide (DEMADEX) 10 MG tablet Take 1 tablet (10 mg total) by mouth daily. 07/11/21   McLean-Scocuzza, Pasty Spillers, MD  valsartan (DIOVAN) 160 MG tablet Take 1 tablet (160 mg total) by mouth daily. 06/21/23   Antonieta Iba, MD      Allergies    Celexa [citalopram], Jardiance [empagliflozin], Norvasc [amlodipine], Tape, Penicillin v, Penicillin v potassium, and Penicillins    Review of Systems   Review of Systems  Constitutional:  Negative for chills and fever.  HENT:  Negative for sore throat.   Eyes:  Negative for redness.  Respiratory:  Negative for cough and shortness of breath.   Cardiovascular:  Negative for chest pain.  Gastrointestinal:  Negative for abdominal pain, blood in stool, diarrhea and vomiting.  Genitourinary:  Negative for dysuria and flank pain.  Musculoskeletal:  Positive for extremity weakness. Negative for back pain and neck pain.  Skin:  Negative for rash.  Neurological:  Negative for syncope, speech difficulty, light-headedness, numbness and headaches.    Physical Exam Updated Vital Signs BP (!) 174/71   Pulse 80   Temp 98.4 F (36.9 C) (Oral)   Resp 20   SpO2 100%  Physical Exam Vitals and nursing note reviewed.  Constitutional:      Appearance: Normal appearance. She is well-developed.  HENT:     Head: Atraumatic.     Nose: Nose normal.     Mouth/Throat:     Mouth: Mucous membranes are moist.  Eyes:     General: No scleral icterus.    Conjunctiva/sclera: Conjunctivae normal.     Pupils: Pupils are equal, round, and reactive to light.  Neck:     Vascular: No carotid bruit.     Trachea: No tracheal deviation.  Cardiovascular:     Rate and Rhythm: Normal rate and regular rhythm.     Pulses: Normal pulses.     Heart sounds: Normal heart  sounds. No murmur heard.    No friction rub. No gallop.  Pulmonary:     Effort: Pulmonary effort is normal. No respiratory distress.     Breath sounds: Normal breath sounds.  Chest:     Chest wall: No tenderness.  Abdominal:     General: Bowel sounds are normal. There is no distension.     Palpations: Abdomen is soft.     Tenderness: There is no abdominal tenderness. There is no guarding.  Genitourinary:    Comments: No cva tenderness.  Musculoskeletal:        General: No tenderness.     Cervical back: Normal  range of motion and neck supple. No rigidity or tenderness. No muscular tenderness.     Comments: CTLS spine, non tender, aligned, no step off. Symmetric, bilateral leg edema/class III obesity. Good rom bil extremities without pain or focal bony tenderness. Distal pulses palp.  Left lower leg with mild erythema and slightly increased warmth ?chronic venous stasis (as similar appearance noted previously), vs mild cellulitis.  Skin:    General: Skin is warm and dry.     Findings: No rash.  Neurological:     Mental Status: She is alert.     Comments: Alert, speech normal. Motor/sens grossly intact bil.   Psychiatric:        Mood and Affect: Mood normal.     ED Results / Procedures / Treatments   Labs (all labs ordered are listed, but only abnormal results are displayed) Results for orders placed or performed during the hospital encounter of 07/10/23  CBC   Collection Time: 07/10/23 12:08 PM  Result Value Ref Range   WBC 6.5 4.0 - 10.5 K/uL   RBC 4.18 3.87 - 5.11 MIL/uL   Hemoglobin 12.1 12.0 - 15.0 g/dL   HCT 53.6 64.4 - 03.4 %   MCV 92.3 80.0 - 100.0 fL   MCH 28.9 26.0 - 34.0 pg   MCHC 31.3 30.0 - 36.0 g/dL   RDW 74.2 59.5 - 63.8 %   Platelets 433 (H) 150 - 400 K/uL   nRBC 0.0 0.0 - 0.2 %  Comprehensive metabolic panel   Collection Time: 07/10/23 12:08 PM  Result Value Ref Range   Sodium 136 135 - 145 mmol/L   Potassium 4.1 3.5 - 5.1 mmol/L   Chloride 103 98 -  111 mmol/L   CO2 23 22 - 32 mmol/L   Glucose, Bld 377 (H) 70 - 99 mg/dL   BUN 17 8 - 23 mg/dL   Creatinine, Ser 7.56 (H) 0.44 - 1.00 mg/dL   Calcium 9.3 8.9 - 43.3 mg/dL   Total Protein 6.6 6.5 - 8.1 g/dL   Albumin 3.2 (L) 3.5 - 5.0 g/dL   AST 29 15 - 41 U/L   ALT 20 0 - 44 U/L   Alkaline Phosphatase 55 38 - 126 U/L   Total Bilirubin 1.0 0.0 - 1.2 mg/dL   GFR, Estimated 36 (L) >60 mL/min   Anion gap 10 5 - 15      EKG EKG Interpretation Date/Time:  Wednesday July 10 2023 12:16:55 EST Ventricular Rate:  75 PR Interval:  242 QRS Duration:  109 QT Interval:  427 QTC Calculation: 477 R Axis:   -48  Text Interpretation: Sinus rhythm Prolonged PR interval Incomplete left bundle branch block Left ventricular hypertrophy Confirmed by Cathren Laine ([id number]) on 07/10/2023 12:22:40 PM  Radiology No results found.  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 Labs: ordered. Decision-making details documented in ED Course. ECG/medicine tests: ordered and independent interpretation performed. Decision-making details  documented in ED Course.  Risk OTC drugs. Prescription drug management. Decision regarding hospitalization.   Iv ns. Continuous pulse ox and cardiac monitoring. Labs ordered/sent.   Differential diagnosis includes gen weakness, dehydration, infection, etc. Dispo decision including potential need for admission considered - will get labs and reassess.   Reviewed nursing notes and prior charts for additional history. External reports reviewed. Additional history from: EMS.   Cardiac monitor: sinus rhythm, rate 80.  Labs reviewed/interpreted by me - glucose high, 377, hco3 normal. Ems reported patient had taken her insulin after their arrival. Will repeat cbg. Ua is pending.   1530 ua, repeat cbg pending - signed out to Dr Jearld Fenton to f/u on pending labs, recheck and dispo appropriately.   From notes and patient, it appears pt with tentative plan to move in with granddaughter in three days time. Currently no able to ambulate or attend to ADLs on own and is living on own. Will ask TOC to reach out to family re possibly transitioning patient there now, or sooner, or them staying with patient to assist until planned transition, and/or to consider home health extender service, PT, etc.            Final Clinical Impression(s) / ED Diagnoses Final diagnoses:  None    Rx / DC Orders ED Discharge Orders     None         Cathren Laine, MD 07/10/23 1537

## 2023-07-15 ENCOUNTER — Telehealth: Payer: Self-pay

## 2023-07-15 ENCOUNTER — Encounter: Payer: Self-pay | Admitting: Oncology

## 2023-07-15 ENCOUNTER — Inpatient Hospital Stay (HOSPITAL_BASED_OUTPATIENT_CLINIC_OR_DEPARTMENT_OTHER): Payer: Medicare Other | Admitting: Oncology

## 2023-07-15 ENCOUNTER — Inpatient Hospital Stay: Payer: Medicare Other | Attending: Oncology

## 2023-07-15 VITALS — BP 156/64 | HR 67 | Temp 97.2°F | Resp 18

## 2023-07-15 DIAGNOSIS — M1990 Unspecified osteoarthritis, unspecified site: Secondary | ICD-10-CM | POA: Insufficient documentation

## 2023-07-15 DIAGNOSIS — D72820 Lymphocytosis (symptomatic): Secondary | ICD-10-CM

## 2023-07-15 DIAGNOSIS — N1832 Chronic kidney disease, stage 3b: Secondary | ICD-10-CM | POA: Diagnosis not present

## 2023-07-15 DIAGNOSIS — I5032 Chronic diastolic (congestive) heart failure: Secondary | ICD-10-CM

## 2023-07-15 LAB — CBC WITH DIFFERENTIAL (CANCER CENTER ONLY)
Abs Immature Granulocytes: 0.01 10*3/uL (ref 0.00–0.07)
Basophils Absolute: 0.1 10*3/uL (ref 0.0–0.1)
Basophils Relative: 1 %
Eosinophils Absolute: 0.1 10*3/uL (ref 0.0–0.5)
Eosinophils Relative: 1 %
HCT: 36.1 % (ref 36.0–46.0)
Hemoglobin: 11.6 g/dL — ABNORMAL LOW (ref 12.0–15.0)
Immature Granulocytes: 0 %
Lymphocytes Relative: 43 %
Lymphs Abs: 3.3 10*3/uL (ref 0.7–4.0)
MCH: 29.6 pg (ref 26.0–34.0)
MCHC: 32.1 g/dL (ref 30.0–36.0)
MCV: 92.1 fL (ref 80.0–100.0)
Monocytes Absolute: 0.7 10*3/uL (ref 0.1–1.0)
Monocytes Relative: 10 %
Neutro Abs: 3.5 10*3/uL (ref 1.7–7.7)
Neutrophils Relative %: 45 %
Platelet Count: 387 10*3/uL (ref 150–400)
RBC: 3.92 MIL/uL (ref 3.87–5.11)
RDW: 13.1 % (ref 11.5–15.5)
WBC Count: 7.7 10*3/uL (ref 4.0–10.5)
nRBC: 0 % (ref 0.0–0.2)

## 2023-07-15 LAB — CMP (CANCER CENTER ONLY)
ALT: 22 U/L (ref 0–44)
AST: 26 U/L (ref 15–41)
Albumin: 3.3 g/dL — ABNORMAL LOW (ref 3.5–5.0)
Alkaline Phosphatase: 51 U/L (ref 38–126)
Anion gap: 8 (ref 5–15)
BUN: 18 mg/dL (ref 8–23)
CO2: 26 mmol/L (ref 22–32)
Calcium: 8.9 mg/dL (ref 8.9–10.3)
Chloride: 102 mmol/L (ref 98–111)
Creatinine: 1.42 mg/dL — ABNORMAL HIGH (ref 0.44–1.00)
GFR, Estimated: 36 mL/min — ABNORMAL LOW (ref >60)
Glucose, Bld: 279 mg/dL — ABNORMAL HIGH (ref 70–99)
Potassium: 3.9 mmol/L (ref 3.5–5.1)
Sodium: 136 mmol/L (ref 135–145)
Total Bilirubin: 0.8 mg/dL (ref 0.0–1.2)
Total Protein: 6.4 g/dL — ABNORMAL LOW (ref 6.5–8.1)

## 2023-07-15 NOTE — Assessment & Plan Note (Addendum)
#  Monoclonal lymphocytosis of unknown significance.  Labs are reviewed and discussed with patient. 12/26/21 peripheral blood flowcytometry showed CD5 positive monoclonal B cell lymphocytes, 17% of leukocytes, <5000/ul, phenotype is non specific.  10/10/2022 flowcytometry showed a partial CD5 positive monoclonal B-cell population detected, 15% of leukocytes, <5,000/ul.  Patient is asymptomatic, no constitutional symptoms. Recommend observation.

## 2023-07-15 NOTE — Progress Notes (Signed)
Hematology/Oncology Progress note [phone] [phone]     CHIEF COMPLAINTS/REASON FOR VISIT:  Follow up for monoclonal lymphocytosis  ASSESSMENT & PLAN:   Monoclonal B-cell lymphocytosis of undetermined significance #Monoclonal lymphocytosis of unknown significance.  Labs are reviewed and discussed with patient. 12/26/21 peripheral blood flowcytometry showed CD5 positive monoclonal B cell lymphocytes, 17% of leukocytes, <5000/ul, phenotype is non specific.  10/10/2022 flowcytometry showed a partial CD5 positive monoclonal B-cell population detected, 15% of leukocytes, <5,000/ul.   Patient is asymptomatic, no constitutional symptoms. Recommend observation.   Stage 3b chronic kidney disease (HCC) Encourage oral hydration.  Avoid nephrotoxins.   Orders Placed This Encounter  Procedures   CMP (Cancer Center only)    Standing Status:   Future    Expected Date:   01/12/2024    Expiration Date:   07/14/2024   CBC with Differential (Cancer Center Only)    Standing Status:   Future    Expected Date:   01/12/2024    Expiration Date:   07/14/2024   Flow cytometry panel-leukemia/lymphoma work-up    Standing Status:   Future    Expected Date:   01/12/2024    Expiration Date:   07/14/2024   Follow-up in 6 months. All questions were answered. The patient knows to call the clinic with any problems, questions or concerns.  Rickard Patience, MD, PhD [facility] Hematology Oncology 07/15/2023     HISTORY OF PRESENTING ILLNESS:  Alexis Farmer is a  83 y.o.  female with PMH listed below who was referred to me for evaluation of leukocytosis Reviewed patient' recent labs obtained by PCP.   12/13/2021 CBC showed elevated white count of 6.6, increased lymphocyte percentage 55.7.  Absolute lymphocytes 3.7. Previous lab records reviewed. Leukocytosis onset of intermittent. Patient reports chronic arthritis, bilateral lower extremity swelling, left worse than right.  She was  recently seen by dermatology for a skin lesion on her back.  Patient reports that her previously weighed 260 pounds.  Currently she weighed 240 pounds. Appetite is fair. No fever, night sweating.  Patient was accompanied by her granddaughter.  08/12/21 CT abdomen pelvis did not show any lymphadenopathy. Physical examination did not showed lymphadenopathy.   INTERVAL HISTORY Alexis Farmer is a 83 y.o. female who has above history reviewed by me today presents for follow up visit for monoclonal lymphocytosis of unknown significance.. Patient's follow-up appointment was rescheduled from October 2024 due to her hospitalization secondary to acute CHF.hyponatremia. The patient expresses no new concerns.  She is anxious about how her blood work looks like.  Review of Systems  Constitutional:  Positive for fatigue. Negative for appetite change, chills, fever and unexpected weight change.  HENT:   Negative for hearing loss and voice change.   Eyes:  Negative for eye problems.  Respiratory:  Negative for chest tightness and cough.   Cardiovascular:  Negative for chest pain.  Gastrointestinal:  Negative for abdominal distention, abdominal pain and blood in stool.  Endocrine: Negative for hot flashes.  Genitourinary:  Negative for difficulty urinating and frequency.   Musculoskeletal:  Positive for arthralgias.  Skin:  Negative for itching.  Neurological:  Negative for extremity weakness.  Hematological:  Negative for adenopathy.  Psychiatric/Behavioral:  Negative for confusion.      MEDICAL HISTORY:  Past Medical History:  Diagnosis Date   Anxiety 09/13/2015   Arthritis    Atypical chest pain 01/15/2021   Blackhead 10/24/2021   Bradycardia 07/17/2016   Formatting of this note might be different from the  original.  Last Assessment & Plan:   Found to be bradycardic at outside hospital. They stopped metoprolol and verapamil and heart rate has been normal since then. Echo appears to have been  reassuring at the outside facility. Appears to be doing much better with regards to this. She'll continue to monitor for recurrent symptoms. 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  Last Assessment & Plan:   Continues to have issues with this.  She is following with a specialist and it sounds as though they are planning on an MRI.  Benign exam today.   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 Loletha Carrow of this note might be different from the original.  Dr. Thedore Mins      Last Assessment & Plan:   Recheck kidney function today.   Constipation 11/17/2019   Cough 01/25/2016   Formatting of this note might be different from the original.  Last Assessment & Plan:   Minimal nighttime cough. Improves when she washes her pillows consistently. Suspect allergies contributing. She'll monitor.   Diabetes mellitus without complication (HCC)    one elevated reading/ no treatment   Disorder of rotator cuff 06/24/2018   Diverticulitis    Dysuria 03/22/2017   Formatting of this note might be different from the original.  Last Assessment & Plan:   Symptoms concerning for UTI.  Will check urinalysis.   Edema of foot 04/08/2014   Formatting of this note might be different from the original.  Last Assessment & Plan:   Chronic pedal edema. Suspect venous insufficiency. No orthopnea or shortness of breath. Advised elevation of her legs. Consider compression stockings in the future.   Elevated troponin 12/15/2020   Essential hypertension 04/08/2014   Formatting of this note might be different from the original.  Last Assessment & Plan:   Well-controlled on recheck.  Continue current regimen.   Fall 03/26/2018   Gastroenteritis 08/12/2021   Gastrointestinal hemorrhage 08/03/2015   GI bleed    High cholesterol    History of blood transfusion    Hyperglycemia due to type 2 diabetes  mellitus (HCC) 03/11/2015   Hypertension    Hypertensive urgency 12/15/2020   Low back pain 04/08/2014   Morbid obesity (HCC) 04/08/2014   Formatting of this note might be different from the original.  Last Assessment & Plan:   Weight is stable. Discussed diet and exercise at length. Encouraged whatever exercise she can do. Given diet information.   Palpitations 12/15/2020   Peripheral edema 12/22/2019   Postmenopausal bleeding 07/17/2016   Formatting of this note might be different from the original.  Last Assessment & Plan:   Recent D&C. Following with gynecology. Benign findings. Monitor for recurrence.   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  Last Assessment & Plan:   Continues to have issues with this.  She is following with a specialist and it sounds as though they are planning on an MRI.  Benign exam today.   Recurrent falls 08/13/2019   Renal insufficiency    Skin cyst 10/24/2021   Stage 3a chronic kidney disease (HCC) 01/15/2021   Swelling of lower leg 07/19/2016   Vertigo 10/13/2015   Formatting of this note might be different from the  original.  Last Assessment & Plan:   No recurrence. Discussed that meclizine is an as needed medication and that she does not need to take this daily. She will continue to monitor.    SURGICAL HISTORY: Past Surgical History:  Procedure Laterality Date   APPENDECTOMY     CHOLECYSTECTOMY     ECTOPIC PREGNANCY SURGERY     EYE SURGERY     bilateral cataracts   EYE SURGERY     02/11/2019 repair hole in right eye    gallbladder      HYSTEROSCOPY WITH D & C N/A 10/26/2016   Procedure: DILATATION AND CURETTAGE /HYSTEROSCOPY;  Surgeon: Christeen Douglas, MD;  Location: [facility] ORS;  Service: Gynecology;  Laterality: N/A;   HYSTEROSCOPY WITH D & C N/A 07/07/2018   Procedure: DILATATION AND CURETTAGE  /HYSTEROSCOPY;  Surgeon: Christeen Douglas, MD;  Location: [facility] ORS;  Service: Gynecology;  Laterality: N/A;   THYROIDECTOMY, PARTIAL      SOCIAL HISTORY: Social History   Socioeconomic History   Marital status: Widowed    Spouse name: Not on file   Number of children: 1   Years of education: 48   Highest education level: 12th grade  Occupational History   Occupation: retired    Comment: hx of Retail banker, Advertising copywriter, 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  Tobacco Use   Smoking status: Never   Smokeless tobacco: Never  Vaping Use   Vaping status: Never Used  Substance and Sexual Activity   Alcohol use: No   Drug use: No   Sexual activity: Not Currently  Other Topics Concern   Not on file  Social History Narrative   Lives alone    From [place]   Widowed - was married 3 times starting at age 51    hx of seamtress, 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   Social Drivers of Health   Financial Resource Strain: Low Risk  (11/29/2022)   Overall Financial Resource Strain (CARDIA)    Difficulty of Paying Living Expenses: Not hard at all  Food Insecurity: No Food Insecurity (11/29/2022)   Hunger Vital Sign    Worried About Running Out of Food in the Last Year: Never true    Ran Out of Food in the Last Year: Never true  Transportation Needs: Unmet Transportation Needs (04/29/2023)   PRAPARE - Administrator, Civil Service (Medical): Yes    Lack of Transportation (Non-Medical): No  Physical Activity: Inactive (11/29/2022)   Exercise Vital Sign    Days of Exercise per Week: 0 days    Minutes of Exercise per Session: 0 min  Stress: Stress Concern Present (11/29/2022)   [employer] of Occupational Health - Occupational Stress Questionnaire    Feeling of Stress : To some extent  Social Connections: Socially Integrated (11/29/2022)   Social Connection and Isolation Panel [NHANES]    Frequency of  Communication with Friends and Family: More than three times a week    Frequency of Social Gatherings with Friends and Family: More than three times a week    Attends Religious Services: More than 4 times per year    Active Member of [employer] or Organizations: Yes    Attends Banker Meetings: More than 4 times per year    Marital Status: Married  Catering manager Violence: Not At Risk (11/29/2022)   Humiliation, Afraid, Rape, and Kick questionnaire    Fear of Current or Ex-Partner: No  Emotionally Abused: No    Physically Abused: No    Sexually Abused: No    FAMILY HISTORY: Family History  Problem Relation Age of Onset   Diabetes Mother    Hypertension Mother    Stroke Mother    Diabetes Other    Healthy Father    Diabetes Sister    Heart disease Sister     ALLERGIES:  is allergic to celexa [citalopram], jardiance [empagliflozin], norvasc [amlodipine], tape, penicillin v, penicillin v potassium, and penicillins.  MEDICATIONS:  Current Outpatient Medications  Medication Sig Dispense Refill   acetaminophen (TYLENOL) 500 MG tablet Take 500 mg by mouth every 6 (six) hours as needed for mild pain or headache.     Ascorbic Acid (VITAMIN C PO) Take 1 tablet by mouth daily.     aspirin EC 81 MG tablet Take 81 mg by mouth 2 (two) times a week. Swallow whole.     blood glucose meter kit and supplies KIT Accu chek, Dx code E11.65, check 3 times daily 1 each 0   Blood Pressure KIT 1 Device by Does not apply route daily. 1 kit 0   cephALEXin (KEFLEX) 500 MG capsule Take 1 capsule (500 mg total) by mouth every 8 (eight) hours.     cloNIDine (CATAPRES) 0.2 MG tablet Take 1 tablet (0.2 mg total) by mouth 2 (two) times daily. 270 tablet 3   glucose blood (ACCU-CHEK GUIDE) test strip USE AS INSTRUCTED 3 TIMES DAILY 300 strip 12   hydrALAZINE (APRESOLINE) 100 MG tablet TAKE 1 TABLET (100 MG TOTAL) BY MOUTH 4 (FOUR) TIMES DAILY. 360 tablet 2   insulin NPH Human (NOVOLIN N) 100 UNIT/ML  injection INJECT 20 UNITS IN THE MORNING AND THE EVENING WITH A MEAL 40 mL PRN   insulin regular (NOVOLIN R) 100 units/mL injection Inject 0-0.08 mLs (0-8 Units total) into the skin 3 (three) times daily before meals. 30 mL 11   Insulin Syringe-Needle U-100 (BD INSULIN SYRINGE U/F) 30G X 1/2" 0.5 ML MISC USE UP TO 5X PER DAY WITH INSULIN 2X (NPH) AND 3X (REGULAR) 500 each 12   LORazepam (ATIVAN) 1 MG tablet Take 0.5 tablets (0.5 mg total) by mouth daily as needed for anxiety. Inform pt 0.5 dose on backorder has to cut this in 1/2 dose 15 tablet 0   Multiple Vitamins-Minerals (CENTRUM SILVER 50+WOMEN) TABS Take 1 tablet by mouth daily with breakfast.     nebivolol (BYSTOLIC) 10 MG tablet TAKE 1 TABLET BY MOUTH EVERY DAY 90 tablet 0   nystatin (MYCOSTATIN/NYSTOP) powder Apply topically 2 (two) times daily. Under abdomen 60 g 11   polyethylene glycol powder (GLYCOLAX/MIRALAX) 17 GM/SCOOP powder Take 17 g by mouth daily as needed for moderate constipation or severe constipation. Can take up to 2x per day prn. Mix with 8 ounces of liquid 3350 g 11   rosuvastatin (CRESTOR) 20 MG tablet TAKE 1 TABLET BY MOUTH EVERY DAY 90 tablet 3   spironolactone (ALDACTONE) 25 MG tablet Take 25 mg by mouth 3 (three) times a week. Monday Wednesday and friday     torsemide (DEMADEX) 10 MG tablet Take 1 tablet (10 mg total) by mouth daily. 90 tablet 3   valsartan (DIOVAN) 160 MG tablet Take 1 tablet (160 mg total) by mouth daily. 90 tablet 0   HYDROcodone-acetaminophen (NORCO/VICODIN) 5-325 MG tablet Take 1 tablet by mouth every 4 (four) hours as needed for moderate pain (pain score 4-6) or severe pain (pain score 7-10). (Patient not taking: Reported  on 07/15/2023) 30 tablet 0   No current facility-administered medications for this visit.     PHYSICAL EXAMINATION: ECOG PERFORMANCE STATUS: 2 - Symptomatic, <50% confined to bed Vitals:   07/15/23 1307 07/15/23 1325  BP: (!) 167/54 (!) 156/64  Pulse: 67   Resp: 18    Temp: (!) 97.2 F (36.2 C)   SpO2: 100%    There were no vitals filed for this visit.   Physical Exam Constitutional:      General: She is not in acute distress.    Appearance: She is obese.  HENT:     Head: Normocephalic.  Eyes:     General: No scleral icterus. Cardiovascular:     Rate and Rhythm: Normal rate.  Pulmonary:     Effort: Pulmonary effort is normal. No respiratory distress.     Breath sounds: No wheezing.  Abdominal:     General: Bowel sounds are normal. There is no distension.     Palpations: Abdomen is soft.  Musculoskeletal:        General: No deformity. Normal range of motion.     Cervical back: Normal range of motion and neck supple.     Right lower leg: Edema present.     Left lower leg: Edema present.  Skin:    General: Skin is warm and dry.     Findings: No erythema or rash.  Neurological:     Mental Status: She is alert and oriented to person, place, and time. Mental status is at baseline.  Psychiatric:        Mood and Affect: Mood normal.       RADIOGRAPHIC STUDIES: I have personally reviewed the radiological images as listed and agreed with the findings in the report. No results found.   LABORATORY DATA:  I have reviewed the data as listed    Latest Ref Rng & Units 07/15/2023   12:57 PM 07/10/2023   12:08 PM 04/02/2023    4:29 AM  CBC  WBC 4.0 - 10.5 K/uL 7.7  6.5  8.1   Hemoglobin 12.0 - 15.0 g/dL 82.9  56.2  13.0   Hematocrit 36.0 - 46.0 % 36.1  38.6  34.8   Platelets 150 - 400 K/uL 387  433  258        Latest Ref Rng & Units 07/15/2023   12:57 PM 07/10/2023   12:08 PM 04/02/2023    4:29 AM  CMP  Glucose 70 - 99 mg/dL 865  784  696   BUN 8 - 23 mg/dL 18  17  25    Creatinine 0.44 - 1.00 mg/dL 2.95  2.84  1.32   Sodium 135 - 145 mmol/L 136  136  127   Potassium 3.5 - 5.1 mmol/L 3.9  4.1  4.0   Chloride 98 - 111 mmol/L 102  103  91   CO2 22 - 32 mmol/L 26  23  25    Calcium 8.9 - 10.3 mg/dL 8.9  9.3  9.4   Total Protein 6.5  - 8.1 g/dL 6.4  6.6  6.7   Total Bilirubin 0.0 - 1.2 mg/dL 0.8  1.0  0.9   Alkaline Phos 38 - 126 U/L 51  55  40   AST 15 - 41 U/L 26  29  22    ALT 0 - 44 U/L 22  20  17      Iron/TIBC/Ferritin/ %Sat    Component Value Date/Time   IRON 78 12/13/2021 1033   TIBC 341.6 12/13/2021  1033   FERRITIN 246.9 12/13/2021 1033   IRONPCTSAT 22.8 12/13/2021 1033

## 2023-07-15 NOTE — Assessment & Plan Note (Addendum)
Encourage oral hydration.  Avoid nephrotoxins. Previous multiple myeloma panel negative for M protein, slightly elevated light chain ratio, nonspecific.

## 2023-07-16 ENCOUNTER — Telehealth: Payer: Self-pay

## 2023-07-16 ENCOUNTER — Other Ambulatory Visit: Payer: Self-pay | Admitting: Cardiovascular Disease

## 2023-07-16 NOTE — Patient Outreach (Signed)
  Care Coordination   07/16/2023 Name: Alexis Farmer MRN: [id number] DOB: 1941-06-07   Care Coordination Outreach Attempts:  Attempted return call to patient. Unable to reach patient or leave voice message. Phone was answered and immediately hung up.   Follow Up Plan:  Additional outreach attempts will be made to offer the patient complex care management information and services.   Encounter Outcome:  No Answer   Care Coordination Interventions:  No, not indicated    [name] RN, BSN, CCM [employer], Population Health Case Manager Phone: [phone]

## 2023-07-16 NOTE — Telephone Encounter (Signed)
Copied from CRM [id number]. Topic: Clinical - Home Health Verbal Orders >> Jul 16, 2023 12:31 PM Louie Boston wrote: Caller/Agency: [facility] Callback Number: [phone] Service Requested: Physical Therapy Frequency: 1 Week 1, 2 Week 2, 3 Week 3 Any new concerns about the patient? Yes - PT will like to add Social Work to the verbal auth. Patient is having trouble getting to appts.

## 2023-07-17 ENCOUNTER — Telehealth: Payer: Self-pay

## 2023-07-17 ENCOUNTER — Telehealth: Payer: Self-pay | Admitting: *Deleted

## 2023-07-17 ENCOUNTER — Ambulatory Visit: Payer: Self-pay

## 2023-07-17 DIAGNOSIS — R5381 Other malaise: Secondary | ICD-10-CM

## 2023-07-17 DIAGNOSIS — G8929 Other chronic pain: Secondary | ICD-10-CM

## 2023-07-17 DIAGNOSIS — R269 Unspecified abnormalities of gait and mobility: Secondary | ICD-10-CM

## 2023-07-17 DIAGNOSIS — M47816 Spondylosis without myelopathy or radiculopathy, lumbar region: Secondary | ICD-10-CM

## 2023-07-17 DIAGNOSIS — R29898 Other symptoms and signs involving the musculoskeletal system: Secondary | ICD-10-CM

## 2023-07-17 NOTE — Telephone Encounter (Signed)
Yes

## 2023-07-17 NOTE — Patient Instructions (Signed)
Visit Information  Thank you for taking time to visit with me today. Please don't hesitate to contact me if I can be of assistance to you.   Following are the goals we discussed today:   Goals Addressed             This Visit's Progress    Patient Stated:  " Getting my diabetes under control and manage heart failure and chronic health conditions"       Interventions Today    Flowsheet Row Most Recent Value  Chronic Disease   Chronic disease during today's visit Diabetes, Congestive Heart Failure (CHF)  [evaluation of current treatment plan for listed health conditions and patients adherence to plan as established by provider. Assessed for blood sugar readings, HF symptoms, and for any new / ongoing symptoms.]  General Interventions   General Interventions Discussed/Reviewed General Interventions Reviewed, Doctor Visits, Labs  Labs Hgb A1c every 6 months  [Discussed most recent Hgb A1c. Advised patient to inform endocrinologist at next visit that since she is receiving home health services provider can send order to home health agency requesting labs to be done.]  Doctor Visits Discussed/Reviewed Doctor Visits Reviewed  Annabell Sabal upcoming provider visits. Discussed transportation access for upcoming provider visits.]  Exercise Interventions   Exercise Discussed/Reviewed Physical Activity  [Assessed for patients current physical activity and mobility.]  Education Interventions   Education Provided Provided Education  [Advised ongoing monitoring of blood sugars daily. Reporting elevated blood sugars.250-300"s and up to provider.]  Provided Verbal Education On Other  [Advised to consider getting lift chair. Advised to contact department of social services to apply for Medicaid.]  Nutrition Interventions   Nutrition Discussed/Reviewed Nutrition Reviewed, Decreasing sugar intake, Portion sizes, Carbohydrate meal planning  [Advised to eat a low carbohydrate and low salt meals, watch portion  sizes and avoid sugar sweetended drinks. Discussed carbohydrate controlled meals.]  Pharmacy Interventions   Pharmacy Dicussed/Reviewed Pharmacy Topics Reviewed  [medications reviewed and compliance discussed.]  Safety Interventions   Safety Discussed/Reviewed Fall Risk, Home Safety  [assessed for falls. Discussed home safety regarding fall prevention since has new living arrangements. Advised to use ambulatory device as recommended. Informed patient receiving home PT with [facility] that started on 07/16/23]              Our next appointment is by telephone on 08/09/23 at 2:45 pm  Please call the care guide team at [phone] if you need to cancel or reschedule your appointment.   If you are experiencing a Mental Health or Behavioral Health Crisis or need someone to talk to, please call the Suicide and Crisis Lifeline: 988 call 1-800-273-TALK (toll free, 24 hour hotline)  The patient verbalized understanding of instructions, educational materials, and care plan provided today and agreed to receive a mailed copy of patient instructions, educational materials, and care plan.   [name] RN, BSN, CCM [employer], Population Health Case Manager Phone: [phone]

## 2023-07-17 NOTE — Telephone Encounter (Signed)
Alexis Farmer with [place] home health notified of verbal order approval

## 2023-07-17 NOTE — Patient Outreach (Signed)
Care Coordination   Follow Up Visit Note   07/17/2023 Name: Alexis Farmer MRN: [id number] DOB: 07-28-1940  Alexis Farmer is a 83 y.o. year old female who sees [name], MD for primary care. I spoke with  [name] by phone today.  What matters to the patients health and wellness today?  Patient states she officially moved in with her granddaughter and her husband. Patient denies any increase symptoms for HF.  Patient states her blood sugars are still ranging in the 200-300's. She states she is taking her medications as prescribed and denies any changes with diabetes medications. Patient states she feels like her blood sugar will get better since she is living with her granddaughter now. She states she will be eating better.  Patient state she is scheduled to have a virtual visit with her endocrinologist 07/18/23.  Patient states she is working with the [employer] guide regarding transportation and she has to submit a new application.  Patient states it is still difficult for her to manage her ADL's.  Due to having left sided weakness.  She states her daughter is looking to bring someone in to help her with her care.  Denies any recent falls.  Patient states PT services with [facility] home health started on yesterday 07/16/23.   She states she continues to use her walker for ambulation.  Patient states she called her primary provider office today to request for hospital bed. Patient states she has not contacted the department of social services as of yet regarding Medicaid application.   Goals Addressed             This Visit's Progress    Patient Stated:  " Getting my diabetes under control and manage heart failure and chronic health conditions"       Interventions Today    Flowsheet Row Most Recent Value  Chronic Disease   Chronic disease during today's visit Diabetes, Congestive Heart Failure (CHF)  [evaluation of current treatment plan for listed health conditions and  patients adherence to plan as established by provider. Assessed for blood sugar readings, HF symptoms, and for any new / ongoing symptoms.]  General Interventions   General Interventions Discussed/Reviewed General Interventions Reviewed, Doctor Visits, Labs  Labs Hgb A1c every 6 months  [Discussed most recent Hgb A1c. Advised patient to inform endocrinologist at next visit that since she is receiving home health services provider can send order to home health agency requesting labs to be done.]  Doctor Visits Discussed/Reviewed Doctor Visits Reviewed  Annabell Sabal upcoming provider visits. Discussed transportation access for upcoming provider visits.]  Exercise Interventions   Exercise Discussed/Reviewed Physical Activity  [Assessed for patients current physical activity and mobility.]  Education Interventions   Education Provided Provided Education  [Advised ongoing monitoring of blood sugars daily. Reporting elevated blood sugars.250-300"s and up to provider.]  Provided Verbal Education On Other  [Advised to consider getting lift chair. Advised to contact department of social services to apply for Medicaid.]  Nutrition Interventions   Nutrition Discussed/Reviewed Nutrition Reviewed, Decreasing sugar intake, Portion sizes, Carbohydrate meal planning  [Advised to eat a low carbohydrate and low salt meals, watch portion sizes and avoid sugar sweetended drinks. Discussed carbohydrate controlled meals.]  Pharmacy Interventions   Pharmacy Dicussed/Reviewed Pharmacy Topics Reviewed  [medications reviewed and compliance discussed.]  Safety Interventions   Safety Discussed/Reviewed Fall Risk, Home Safety  [assessed for falls. Discussed home safety regarding fall prevention since has new living arrangements. Advised to use ambulatory device as recommended. Informed  patient receiving home PT with [facility] that started on 07/16/23]              SDOH assessments and interventions completed:  No     Care  Coordination Interventions:  Yes, provided   Follow up plan: Follow up call scheduled for 08/09/23 at 2:45 pm    Encounter Outcome:  Patient Visit Completed   [name] RN, BSN, CCM [place]  [facility], Population Health Case Manager Phone: [phone]

## 2023-07-17 NOTE — Telephone Encounter (Signed)
Copied from CRM [id number]. Topic: Clinical - Medical Advice >> Jul 17, 2023  9:58 AM Isabell A wrote: Reason for CRM: Patient would like to know who she needs to speak with in order to get a hospital bag, she can't get in her bed or hardly walk.

## 2023-07-17 NOTE — Progress Notes (Signed)
Complex Care Management Note Care Guide Note  07/17/2023 Name: Alexis Farmer MRN: [id number] DOB: [birth year]  Alexis Farmer is a 83 y.o. year old female who is a primary care patient of [name], MD . The community resource team was consulted for assistance with Transportation Needs   SDOH screenings and interventions completed:  Yes  SDOH Interventions Today    Flowsheet Row Most Recent Value  SDOH Interventions   Transportation Interventions SCAT (Specialized Community [employer])  [Provided application for [employer] and [employer] has contact information]        Care guide performed the following interventions: Patient provided with information about care guide support team and interviewed to confirm resource needs. Patient scheduled with [employer] for location access assessment and is being mailed application  Follow Up Plan:  No further follow up planned at this time. The patient has been provided with needed resources.  Encounter Outcome:  Patient Visit Completed  [name]  [facility] HealthPopulation Health Care Guide  Direct Dial:[phone] Fax:[phone] Website: [email or web address]

## 2023-07-18 ENCOUNTER — Other Ambulatory Visit: Payer: Self-pay | Admitting: Family

## 2023-07-18 ENCOUNTER — Telehealth: Payer: Self-pay | Admitting: Family Medicine

## 2023-07-18 ENCOUNTER — Telehealth: Payer: Self-pay

## 2023-07-18 LAB — HEMOGLOBIN A1C: Hemoglobin A1C: 9.7

## 2023-07-18 NOTE — Telephone Encounter (Signed)
Spoke with patient & granddaughter Marijo Conception), they will reach out to the insurance and medical supply stores & will let us know where to send the order.  Will give order to doc of the day to sign

## 2023-07-18 NOTE — Telephone Encounter (Signed)
Per granddaughter, please fax DME order to [employer] [facility] [employer] [facility] with pt demographics attached   Office# [phone] Fax# [phone]

## 2023-07-18 NOTE — Addendum Note (Signed)
Addended by: Warden Fillers on: 07/18/2023 12:11 PM   Modules accepted: Orders

## 2023-07-18 NOTE — Telephone Encounter (Signed)
Called The nurse she the level 2 has nothing to do with the pt. She stated that it was a drug reaction to another drug. They just have to report it to the provider. The pt has been on the medication for years and is not having any trouble with it. They wanted to know how much the pt insulin the pt is taking on the sliding. The nurse will call me back in the am with more information as she was driving.

## 2023-07-18 NOTE — Telephone Encounter (Signed)
DME order placed & printed

## 2023-07-18 NOTE — Telephone Encounter (Signed)
Copied from CRM [id number]. Topic: Clinical - Medication Question >> Jul 18, 2023  9:02 AM Steele Sizer wrote: Reason for CRM:  Lawson Fiscal called from [facility] and asked if someone can provide her a sliding scale for the pt insulin. Callback number is [phone]

## 2023-07-18 NOTE — Telephone Encounter (Signed)
Home health called in to let us know that pt is having a Level 2 reaction to her Joan Flores which she has been taking for a while. I did let them know that Gollan prescribed that medication. Also they wanted to know the instruction for Novolin R I did read the instructions, but let them know that MD Romero Belling prescribed that medication.

## 2023-07-18 NOTE — Telephone Encounter (Signed)
Called ad the nurse is going to call me back.

## 2023-07-18 NOTE — Telephone Encounter (Signed)
Printed off the demographics and faxed to [employer].

## 2023-07-19 NOTE — Telephone Encounter (Signed)
I called Alexis Farmer and gave her the information to the Endo she see, and she is going to call them to find out the sliding scale for the pr insulin.

## 2023-07-22 ENCOUNTER — Telehealth: Payer: Self-pay

## 2023-07-22 ENCOUNTER — Ambulatory Visit: Payer: Self-pay | Admitting: Family Medicine

## 2023-07-22 NOTE — Telephone Encounter (Signed)
 PT is in ED.

## 2023-07-22 NOTE — Telephone Encounter (Signed)
 Noted

## 2023-07-22 NOTE — Telephone Encounter (Signed)
  Chief Complaint: Cough, wheezing Symptoms: cough, fever, wheezing Frequency: Ongoing x 2 days Pertinent Negatives: Patient denies N/V Disposition: [] ED /[] Urgent Care (no appt availability in office) / [x] Appointment(In office/virtual)/ []  [place] [facility]/ [] Home Care/ [] Refused Recommended Disposition /[] [place] [facility]/ []  Follow-up with PCP Additional Notes: Pt's HH nurse reports pt has been experiencing cough, SOB, wheezing, fever x 2 days. Pt denies CP, N/V/D. OV offered, pt declines, states she would rather go be seen at the hospital. [facility] nurse advised she will proceed to UC/ED for eval/treat. Appt canceled at pt request. This RN educated pt on home care, new-worsening symptoms, when to call back/seek emergent care. Pt verbalized understanding and agrees to plan.    Copied from CRM [id number]. Topic: Clinical - Red Word Triage >> Jul 22, 2023  1:50 PM Leavy Cella D wrote: Red Word that prompted transfer to Nurse Triage: Patient has shortness of breath Reason for Disposition  [1] MILD difficulty breathing (e.g., minimal/no SOB at rest, SOB with walking, pulse <100) AND [2] NEW-onset or WORSE than normal  Answer Assessment - Initial Assessment Questions 1. RESPIRATORY STATUS: "Describe your breathing?" (e.g., wheezing, shortness of breath, unable to speak, severe coughing)      Worse at night, with cough 2. ONSET: "When did this breathing problem begin?"      2 days ago  4. SEVERITY: "How bad is your breathing?" (e.g., mild, moderate, severe)    - MILD: No SOB at rest, mild SOB with walking, speaks normally in sentences, can lie down, no retractions, pulse < 100.    - MODERATE: SOB at rest, SOB with minimal exertion and prefers to sit, cannot lie down flat, speaks in phrases, mild retractions, audible wheezing, pulse 100-120.    - SEVERE: Very SOB at rest, speaks in single words, struggling to breathe, sitting hunched forward, retractions, pulse > 120      Mild, worse at  night  8. CAUSE: "What do you think is causing the breathing problem?"      Unknown 9. OTHER SYMPTOMS: "Do you have any other symptoms? (e.g., dizziness, runny nose, cough, chest pain, fever)     Cough, left lower leg non-pitting edema, wheezing 10. O2 SATURATION MONITOR:  "Do you use an oxygen saturation monitor (pulse oximeter) at home?" If Yes, ask: "What is your reading (oxygen level) today?" "What is your usual oxygen saturation reading?" (e.g., 95%)       96%  Protocols used: Breathing Difficulty-A-AH

## 2023-07-22 NOTE — Telephone Encounter (Signed)
Copied from CRM [id number]. Topic: Clinical - Medical Advice >> Jul 22, 2023 10:41 AM Adaysia C wrote: Reason for CRM: Patients physical therapist wanted to inform the provider of the symptoms the patient is currently experiencing cough  abdominal pain shortness of breath blood pressure 180/98 A nurse will be visiting her today and they will call back to let the provider know of any updates.

## 2023-07-23 ENCOUNTER — Ambulatory Visit: Payer: Medicare Other | Admitting: Nurse Practitioner

## 2023-07-23 ENCOUNTER — Telehealth: Payer: Self-pay | Admitting: Family Medicine

## 2023-07-23 NOTE — Telephone Encounter (Signed)
 Left message to return call to our office.

## 2023-07-23 NOTE — Telephone Encounter (Signed)
 Copied from CRM [id number]. Topic: General - Other >> Jul 23, 2023  2:14 PM Melissa C wrote: Reason for CRM: Account executive for [facility].Selinda dropped off 3 orders in December for this patient to be signed and they are missing/haven't been returned. Office is now going to stat fax them to you as well in case you never received them.  Call back number: [phone] Fax number [phone]

## 2023-07-23 NOTE — Telephone Encounter (Signed)
Printed our and placed in provider box.

## 2023-07-24 LAB — LAB REPORT - SCANNED: EGFR: 33

## 2023-07-25 ENCOUNTER — Telehealth: Payer: Self-pay

## 2023-07-25 LAB — LAB REPORT - SCANNED: EGFR: 32

## 2023-07-25 NOTE — Patient Outreach (Signed)
  Care Coordination   07/25/2023 Name: Alexis Farmer MRN: [id number] DOB: [birth year]   Care Coordination Outreach Attempts:  Telephone call attempt to patient.  Unable to reach. HIPAA compliant message left with return call phone number.   Follow Up Plan:  Additional outreach attempts will be made to offer the patient complex care management information and services.   Encounter Outcome:  No Answer   Care Coordination Interventions:  No, not indicated    Arvin Seip RN, BSN, CCM [employer], Population Health Case Manager Phone: [phone]

## 2023-07-25 NOTE — Telephone Encounter (Signed)
 Copied from CRM [id number]. Topic: General - Call Back - No Documentation >> Jul 25, 2023 12:28 PM Joanell B wrote: Reason for CRM: Pt sated that she received a call and missed it. I did inform her that the care coordinator Green, Davina E, RN called. She would like to request a callback.

## 2023-07-25 NOTE — Telephone Encounter (Signed)
Forms filled out and faxed back

## 2023-07-26 ENCOUNTER — Ambulatory Visit: Payer: Medicare Other | Admitting: Cardiovascular Disease

## 2023-07-29 ENCOUNTER — Telehealth: Payer: Self-pay

## 2023-07-29 ENCOUNTER — Telehealth: Payer: Self-pay | Admitting: *Deleted

## 2023-07-29 DIAGNOSIS — E1165 Type 2 diabetes mellitus with hyperglycemia: Secondary | ICD-10-CM

## 2023-07-29 NOTE — Telephone Encounter (Signed)
 Called [facility] to see if they received the faxed order, and gentleman that answered said the secretary is on a zoom call and would give me a call back.

## 2023-07-29 NOTE — Patient Outreach (Signed)
  Care Coordination   Follow Up Visit Note   07/29/2023 Name: Alexis Farmer MRN: [id number] DOB: 1941-04-17  Alexis Farmer is a 83 y.o. year old female who sees Valli Gaw, MD for primary care. I spoke with  [facility] by phone today.  What matters to the patients health and wellness today?  Per chart review patient recently hospitalized from 07/22/23 to 07/26/23 for RSV.  Patient states she is still not feeling well. Denies having post hospital follow up visit with provider.  Patient states she is using the nebulizer and medication and prescribed. She states she continues to have a cough and has to sleep in the recliner.  Patient states she is not able to get into her bed due to her mobility issues. She states she spoke with her primary care provider about this but has not heard from a company regarding the bed.  Patient states she is very weak on her left side and is not able to maneuver even into a car.  This RNCM called [employer] to determine if patients hospital bed was received.  Denies having hospital bed referral.  Contacted patients primary care provider office message left requesting return call to discuss patients referral for hospital bed.  Received return call fro Loetta Ringer with primary provider office who states hospital bed referral was sent to [employer] [facility]. She stated she will contact [facility] to follow up on hospital bed referral then contact patient to update.  Spoke with Ludie Saddler at primary care provider office and arranged patient virtual hospital follow up visit for 08/01/23 at 11 am.  Patient notified    Goals Addressed             This Visit's Progress    Patient Stated:  " Getting my diabetes under control and manage heart failure and chronic health conditions"       Interventions Today    Flowsheet Row Most Recent Value  Chronic Disease   Chronic disease during today's visit Congestive Heart Failure (CHF), Other  [RSV]  General  Interventions   General Interventions Discussed/Reviewed General Interventions Reviewed, Doctor Visits, Durable Medical Equipment (DME)  [Evaluation of current treatment plan for listed health conditions and patients adherence to plan as estabished by provider. Assessed for ongoing RSV symptoms and HF symptoms.]  Doctor Visits Discussed/Reviewed Doctor Visits Reviewed  Carin Charleston upcoming provider visits. Offered to call primary care office to scheduled virtual post hospital discharge follow up visit.]  Durable Medical Equipment (DME) Other  [left message with [employer] to follow up on patients hospital bed referral.  Requested return call.]  Education Interventions   Education Provided Provided Education  [Advised to follow up with community resource guide contact regarding transportation application. Advised to use honey for symptomatic cough relief. Advised to notify primary care provider for ongoing/ worsening symptoms.]  Pharmacy Interventions   Pharmacy Dicussed/Reviewed Pharmacy Topics Reviewed  [Reviewed newly prescribed medications from patients most recent hospitalization due to RSV.  Confirmed patient taking medication appropriately. Advised to take as prescribed.]                SDOH assessments and interventions completed:  No     Care Coordination Interventions:  Yes, provided   Follow up plan: Follow up call scheduled for 08/21/23 at 10:45 am    Encounter Outcome:  Patient Visit Completed   Eyad Rochford RN, BSN, CCM [place]  [facility], Population Health Case Manager Phone: [phone]

## 2023-07-29 NOTE — Transitions of Care (Post Inpatient/ED Visit) (Signed)
 07/29/2023  Name: Alexis Farmer MRN: [id number] DOB: 11/02/1940  Today's TOC FU Call Status: Today's TOC FU Call Status:: Successful TOC FU Call Completed TOC FU Call Complete Date: 07/29/23 Patient's Name and Date of Birth confirmed.  Transition Care Management Follow-up Telephone Call Date of Discharge: 07/26/23 Discharge Facility: Other [facility]) Name of Other (Non[facility]) Discharge Facility: [facility] Type of Discharge: Inpatient Admission Primary Inpatient Discharge Diagnosis:: RSV, CHF, Lymphoma How have you been since you were released from the hospital?: Same Any questions or concerns?: Yes Patient Questions/Concerns:: Problems with transportation Patient Questions/Concerns Addressed: Other: (Discussed with CCM and BSW)  Items Reviewed: Did you receive and understand the discharge instructions provided?: Yes Medications obtained,verified, and reconciled?: Yes (Medications Reviewed) Any new allergies since your discharge?: No Dietary orders reviewed?: NA Do you have support at home?: Yes People in Home: grandchild(ren) Name of Support/Comfort Primary Source: [name] , granddaughter  Medications Reviewed Today: Medications Reviewed Today     Reviewed by Claudene Crystal, RN (Case Manager) on 07/29/23 at 1246  Med List Status: <None>   Medication Order Taking? Sig Documenting Provider Last Dose Status Informant  acetaminophen  (TYLENOL ) 500 MG tablet [id number] No Take 500 mg by mouth every 6 (six) hours as needed for mild pain or headache. [provider] Taking Active Self  Ascorbic Acid (VITAMIN C PO) [id number] No Take 1 tablet by mouth daily. [provider] Taking Active Self  aspirin  EC 81 MG tablet [id number] No Take 81 mg by mouth 2 (two) times a week. Swallow whole. 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 McLean-Scocuzza, Karon Packer, MD Taking Active Self  cephALEXin  (KEFLEX ) 500 MG capsule [id number] No Take 1 capsule (500 mg total) by mouth every 8 (eight) hours. Lorita Rosa, MD Taking Active   cloNIDine  (CATAPRES ) 0.2 MG tablet [id number] No Take 1 tablet (0.2 mg total) by mouth 2 (two) times daily. Gollan, Timothy J, MD Taking Active Self  glucose blood (ACCU-CHEK GUIDE) test strip [id number] No USE AS INSTRUCTED 3 TIMES DAILY Valli Gaw, MD Taking Active Self  hydrALAZINE  (APRESOLINE ) 100 MG tablet [id number] No TAKE 1 TABLET (100 MG TOTAL) BY MOUTH 4 (FOUR) TIMES DAILY. Gollan, Timothy J, MD Taking Active Self  HYDROcodone -acetaminophen  (NORCO/VICODIN) 5-325 MG tablet [id number] No Take 1 tablet by mouth every 4 (four) hours as needed for moderate pain (pain score 4-6) or severe pain (pain score 7-10).  Patient not taking: Reported on 07/15/2023   Lorita Rosa, MD Not Taking Active   insulin  NPH Human (NOVOLIN N) 100 UNIT/ML injection [id number] No INJECT 20 UNITS IN THE MORNING AND THE EVENING WITH A MEAL McLean-Scocuzza, Karon Packer, MD Taking Active Self           Med Note Evans Him Apr 29, 2023 11:57 AM) Patients states she takes 20 units in the morning and 12 units in the evening.   insulin  regular (NOVOLIN R) 100 units/mL injection [id number] No Inject 0-0.08 mLs (0-8 Units total) into the skin 3 (three) times daily before meals. Gwyndolyn Lerner, MD Taking Active Self  Insulin  Syringe-Needle U-100 (BD INSULIN  SYRINGE U/F)  30G X 1/2" 0.5 ML MISC [id number] No USE UP TO 5X PER DAY WITH INSULIN  2X (NPH) AND 3X (REGULAR) Valli Gaw, MD Taking Active Self  LORazepam  (ATIVAN ) 1 MG tablet [id number] No Take 0.5 tablets (0.5 mg total) by mouth daily as needed for anxiety. Inform pt 0.5 dose on backorder has to cut this in  1/2 dose Webb, Padonda B, FNP Taking Active   Multiple Vitamins-Minerals (CENTRUM SILVER 50+WOMEN) TABS [id number] No Take 1 tablet by mouth daily with breakfast. [provider] Taking Active Self  nebivolol  (BYSTOLIC ) 10 MG tablet [id number] No TAKE 1 TABLET BY MOUTH EVERY DAY Gollan, Timothy J, MD Taking Active   nystatin  (MYCOSTATIN /NYSTOP ) powder [id number] No Apply topically 2 (two) times daily. Under abdomen Valli Gaw, MD Taking Active Self  polyethylene glycol powder (GLYCOLAX /MIRALAX ) 17 GM/SCOOP powder [id number] No Take 17 g by mouth daily as needed for moderate constipation or severe constipation. Can take up to 2x per day prn. Mix with 8 ounces of liquid McLean-Scocuzza, Karon Packer, MD Taking Active Self  rosuvastatin  (CRESTOR ) 20 MG tablet [id number] No TAKE 1 TABLET BY MOUTH EVERY DAY McLean-Scocuzza, Karon Packer, MD Taking Active Self           Med Note Colton Dearth Apr 01, 2023  1:08 PM)    spironolactone  (ALDACTONE ) 25 MG tablet [id number] No Take 25 mg by mouth 3 (three) times a week. Monday Wednesday and friday [provider] Taking Active Self  torsemide  (DEMADEX ) 10 MG tablet [id number] No Take 1 tablet (10 mg total) by mouth daily. McLean-Scocuzza, Karon Packer, MD Taking Active Self  valsartan  (DIOVAN ) 160 MG tablet [id number]  TAKE 1 TABLET BY MOUTH EVERY DAY Gollan, Deadra Everts, MD  Active             Home Care and Equipment/Supplies: Were Home Health Services Ordered?: Yes Name of Home Health Agency:: [facility] Has Agency set up a time to come to your home?: No EMR reviewed for Home Health Orders: Orders present/patient has not received call (refer to CM for follow-up) (Resumption of care) Any new equipment or medical supplies ordered?: NA  Functional Questionnaire: Do you need assistance with bathing/showering or dressing?: Yes Do you need assistance with meal preparation?: Yes Do you need assistance with eating?: No Do you have difficulty  maintaining continence: No Do you need assistance with getting out of bed/getting out of a chair/moving?: No Do you have difficulty managing or taking your medications?: No  Follow up appointments reviewed: PCP Follow-up appointment confirmed?: No MD Provider Line Number:[phone] Given: No (The patient states she has to find reliable transportation before she can make an appointment) Specialist Hospital Follow-up appointment confirmed?: NA Do you need transportation to your follow-up appointment?: Yes Transportation Need Intervention Addressed By:: AMB Referral For SDOH Needs Do you understand care options if your condition(s) worsen?: Yes-patient verbalized understanding  SDOH Interventions Today    Flowsheet Row Most Recent Value  SDOH Interventions   Food Insecurity Interventions Intervention Not Indicated  Housing Interventions Intervention Not Indicated  Transportation Interventions AMB Referral  Utilities Interventions Intervention Not Indicated  Social Connections Interventions Intervention Not Indicated      Interventions Today    Flowsheet Row Most Recent Value  Chronic Disease   Chronic disease during today's visit Other  [Lymphoma]  General Interventions   General Interventions Discussed/Reviewed General Interventions Discussed, Community Resources  Doctor Visits Discussed/Reviewed Doctor Visits Discussed  Horticulturist, commercial (DME) Otho Blitz  Exercise Interventions  Exercise Discussed/Reviewed Physical Activity  Physical Activity Discussed/Reviewed Physical Activity Discussed  Education Interventions   Provided Verbal Education On Medication, Development worker, community, Community Resources  Pharmacy Interventions   Pharmacy Dicussed/Reviewed Medications and their functions  Safety Interventions   Safety Discussed/Reviewed Fall Risk       TOC Outreach completed to the patient today. She states she just moved to [place] from [place] and is now having difficulty  with transportation to appointments. Discussed her insurance and instructed her to call the back of her her card for benefits. RNCM placed a referral to LCSW for assistance. The patient states she lives at home with her granddaughter who works. She is slow to get around. ROC for [facility] as they were in place prior to hospitalization. The POD CCM is trying to get a hospital bed. The patient is already established with [facility] and will continue with follow up. The patient does not want to make a PCP appointment until she solidifies transportation.    The patient has been provided with contact information for the care management team and has been advised to call with any health-related questions or concerns. The patient verbalized understanding with current POC. The patient is directed to their insurance card regarding availability of benefits coverage.  Gareld June, BSN, RN   [facility] - [employer] [facility] RN Care Manager [phone]

## 2023-07-29 NOTE — Telephone Encounter (Signed)
Copied from CRM [id number]. Topic: General - Other >> Jul 29, 2023 10:17 AM Gurney Maxin H wrote: Reason for CRM: Davina case manager with [facility] is calling to follow up on order in system since 1/30 for a hospital bed for patient, states order still shows "isn't released, not seen" and wants to know the next steps to get order processed, please reach out, thanks.  Pietro Cassis [phone]

## 2023-07-29 NOTE — Telephone Encounter (Signed)
 Per pt granddaughter she spoke to [place] at [employer] paper work might be in processing. She will check to see what insurance is primary. They were informed that paperwork was faxed on 07/18/2023.

## 2023-07-29 NOTE — Patient Instructions (Signed)
 Visit Information  Thank you for taking time to visit with me today. Please don't hesitate to contact me if I can be of assistance to you.   Following are the goals we discussed today:   Goals Addressed             This Visit's Progress    Patient Stated:  " Getting my diabetes under control and manage heart failure and chronic health conditions"       Interventions Today    Flowsheet Row Most Recent Value  Chronic Disease   Chronic disease during today's visit Congestive Heart Failure (CHF), Other  [RSV]  General Interventions   General Interventions Discussed/Reviewed General Interventions Reviewed, Doctor Visits, Durable Medical Equipment (DME)  [Evaluation of current treatment plan for listed health conditions and patients adherence to plan as estabished by provider. Assessed for ongoing RSV symptoms and HF symptoms.]  Doctor Visits Discussed/Reviewed Doctor Visits Reviewed  Carin Charleston upcoming provider visits. Offered to call primary care office to scheduled virtual post hospital discharge follow up visit.]  Durable Medical Equipment (DME) Other  [left message with [employer] [facility] to follow up on patients hospital bed referral.  Requested return call.]  Education Interventions   Education Provided Provided Education  [Advised to follow up with community resource guide contact regarding transportation application. Advised to use honey for symptomatic cough relief. Advised to notify primary care provider for ongoing/ worsening symptoms.]  Pharmacy Interventions   Pharmacy Dicussed/Reviewed Pharmacy Topics Reviewed  [Reviewed newly prescribed medications from patients most recent hospitalization due to RSV.  Confirmed patient taking medication appropriately. Advised to take as prescribed.]                Our next appointment is by telephone on 08/21/23 at 10:45 am  Please call the care guide team at [phone] if you need to cancel or reschedule your appointment.   If you are  experiencing a Mental Health or Behavioral Health Crisis or need someone to talk to, please call the Suicide and Crisis Lifeline: 988 call 1-800-273-TALK (toll free, 24 hour hotline)  The patient verbalized understanding of instructions, educational materials, and care plan provided today and DECLINED offer to receive copy of patient instructions, educational materials, and care plan.  Verba Girt RN, BSN, CCM [employer], Population Health Case Manager Phone: [phone]

## 2023-07-30 ENCOUNTER — Telehealth: Payer: Self-pay | Admitting: *Deleted

## 2023-07-30 NOTE — Progress Notes (Signed)
Complex Care Management Note Care Guide Note  07/30/2023 Name: Nasiyah Laverdiere MRN: [id number] DOB: 1941/04/23  Octivia Canion is a 83 y.o. year old female who is a primary care patient of [name], MD . The community resource team was consulted for assistance with Transportation Needs   SDOH screenings and interventions completed:  Yes  Social Drivers of Health From This Encounter   Transportation Needs: Unmet Transportation Needs (07/30/2023)   PRAPARE - Administrator, Civil Service (Medical): Yes    Lack of Transportation (Non-Medical): No    SDOH Interventions Today    Flowsheet Row Most Recent Value  SDOH Interventions   Transportation Interventions [facility] ([employer])  [Called [employer] patient has approvednned to return application but can schedule rides now let the patient know to schedule what she needs]        Care guide performed the following interventions: Patient provided with information about care guide support team and interviewed to confirm resource needs. Called [employer] patient has approvednned to return application but can schedule rides now let the patient know to schedule what she needs  Follow Up Plan:  No further follow up planned at this time. The patient has been provided with needed resources.  Encounter Outcome:  Patient Visit Completed  [name]  [facility] HealthPopulation Health Care Guide  Direct Dial:[phone] Fax:[phone] Website: [email or web address]

## 2023-07-30 NOTE — Telephone Encounter (Signed)
Called [employer] and the guy that answered the phone stated the secretary was not in, and I would have to call back to speak to her.

## 2023-07-30 NOTE — Telephone Encounter (Signed)
Called and spoke to pt she stated that she will not feeling any better. She stated she is not sure what is wrong with her. She did ask about the hospital bed, and I let her know that I am still trying to get in touch with the medical company.

## 2023-07-30 NOTE — Telephone Encounter (Signed)
Called and left a message to give me a call

## 2023-08-01 ENCOUNTER — Telehealth: Payer: Self-pay

## 2023-08-01 ENCOUNTER — Encounter: Payer: Self-pay | Admitting: Family Medicine

## 2023-08-01 ENCOUNTER — Telehealth (INDEPENDENT_AMBULATORY_CARE_PROVIDER_SITE_OTHER): Payer: Medicare Other | Admitting: Family Medicine

## 2023-08-01 VITALS — Ht 63.0 in | Wt 256.0 lb

## 2023-08-01 DIAGNOSIS — B338 Other specified viral diseases: Secondary | ICD-10-CM | POA: Diagnosis not present

## 2023-08-01 DIAGNOSIS — F39 Unspecified mood [affective] disorder: Secondary | ICD-10-CM

## 2023-08-01 DIAGNOSIS — Z794 Long term (current) use of insulin: Secondary | ICD-10-CM

## 2023-08-01 DIAGNOSIS — E1159 Type 2 diabetes mellitus with other circulatory complications: Secondary | ICD-10-CM | POA: Diagnosis not present

## 2023-08-01 DIAGNOSIS — I152 Hypertension secondary to endocrine disorders: Secondary | ICD-10-CM

## 2023-08-01 LAB — HEMOGLOBIN A1C: Hemoglobin A1C: 9.7

## 2023-08-01 NOTE — Patient Outreach (Signed)
  Care Coordination   Follow Up Visit Note   08/01/2023 Name: Renell Coaxum MRN: [id number] DOB: Oct 09, 1940  Brandan Glauber is a 83 y.o. year old female who sees [name], MD for primary care. I spoke with  [name] by phone today.  What matters to the patients health and wellness today?  Returned call to patient.  She reports having her virtual follow up visit with her primary provider today and denies any change to treatment plan.  Patient states she was able to speak with [employer] guide and was informed she was approved for her transportation assistance. Patient denies any further needs at this time.     SDOH assessments and interventions completed:  No     Care Coordination Interventions:  No, not indicated   Follow up plan: Follow up call scheduled for as previously scheduled.     Encounter Outcome:  Patient Visit Completed   [name] RN, BSN, CCM [place]  [facility], Population Health Case Manager Phone: [phone]

## 2023-08-01 NOTE — Patient Instructions (Addendum)
It was a pleasure meeting you today. Thank you for allowing me to take part in your health care.  Our goals for today as we discussed include:  You can take Tylenol and/or Ibuprofen as needed for fever reduction and pain relief.   For cough: honey 1/2 to 1 teaspoon (you can dilute the honey in water or another fluid).  You can also use guaifenesin and dextromethorphan for cough. You can use a humidifier for chest congestion and cough.  If you don't have a humidifier, you can sit in the bathroom with the hot shower running.      For sore throat: try warm salt water gargles, cepacol lozenges, throat spray, warm tea or water with lemon/honey, popsicles or ice, or OTC cold relief medicine for throat discomfort.   For congestion: take a daily anti-histamine like Zyrtec, Claritin, and a oral decongestant, such as pseudoephedrine.  You can also use Flonase 1-2 sprays in each nostril daily.   It is important to stay hydrated: drink plenty of fluids (water, gatorade/powerade/pedialyte, juices, or teas) to keep your throat moisturized and help further relieve irritation/discomfort.    If you have any questions or concerns, please do not hesitate to call the office at [phone].  I look forward to our next visit and until then take care and stay safe.  Regards,   [name], MD   [facility]

## 2023-08-01 NOTE — Progress Notes (Unsigned)
Virtual Visit via Video note  I connected with Alexis Farmer on 08/08/23 at 1052 by video and verified that I am speaking with the correct person using two identifiers. Alexis Farmer is currently located at home and daughter Alexis Farmer, is currently with her during visit. The provider, [name], MD is located in their office at time of visit.  I discussed the limitations, risks, security and privacy concerns of performing an evaluation and management service by video and the availability of in person appointments. I also discussed with the patient that there may be a patient responsible charge related to this service. The patient expressed understanding and agreed to proceed.  Subjective: PCP: [name], MD  Chief Complaint  Patient presents with   Hospitalization Follow-up    Tested RSV   Headache    Started yesterday.    HPI Discussed the use of AI scribe software for clinical note transcription with the patient, who gave verbal consent to proceed.  History of Present Illness Alexis Farmer is an 83 year old female who presents with ongoing respiratory symptoms post-hospitalization for RSV. She is accompanied by her daughter, Alexis Farmer.  She was recently hospitalized for four days due to RSV and continues to experience respiratory symptoms. Her breathing has not fully returned to normal, particularly at night when she feels she 'can't breathe.' She experiences shortness of breath, especially when walking, and is not using supplemental oxygen. She was discharged with breathing treatments and medication, including DuoNebs, which she uses every four hours. She also received a prescription for Benzonatate for her cough, which has improved slightly.  Her blood pressure was recorded at 150/100 mmHg by a visiting nurse, and she had not taken her blood pressure medication at that time. Her heart rate was noted to be 60 bpm, and her temperature was 67F. She has not experienced any  chest pain but reports a persistent headache, which she attributes to lack of sleep. She has tried Tylenol for the headache with some relief.  Her appetite is reduced, but she is eating enough to manage her insulin requirements. Her blood sugar levels have been running high, around 300 mg/dL, although she had not checked it on the morning of the conversation.  She lives with her granddaughter and is currently accompanied by her daughter, Alexis Farmer, who expressed concern about her anxiety medication, which she is reluctant to take due to side effects. Alexis Farmer notes that her mother sometimes 'gets excited and cuts off her breathing.'   ROS: Per HPI  Current Outpatient Medications:    acetaminophen (TYLENOL) 500 MG tablet, Take 500 mg by mouth every 6 (six) hours as needed for mild pain or headache., Disp: , Rfl:    Ascorbic Acid (VITAMIN C PO), Take 1 tablet by mouth daily., Disp: , Rfl:    aspirin EC 81 MG tablet, Take 81 mg by mouth 2 (two) times a week. Swallow whole., Disp: , Rfl:    benzonatate (TESSALON) 200 MG capsule, Take 200 mg by mouth 3 (three) times daily as needed for cough., Disp: , Rfl:    blood glucose meter kit and supplies KIT, Accu chek, Dx code E11.65, check 3 times daily, Disp: 1 each, Rfl: 0   Blood Pressure KIT, 1 Device by Does not apply route daily., Disp: 1 kit, Rfl: 0   budesonide (PULMICORT) 0.5 MG/2ML nebulizer solution, Take 0.5 mg by nebulization daily., Disp: , Rfl:    glucose blood (ACCU-CHEK GUIDE) test strip, USE AS INSTRUCTED 3 TIMES DAILY, Disp:  300 strip, Rfl: 12   hydrALAZINE (APRESOLINE) 100 MG tablet, TAKE 1 TABLET (100 MG TOTAL) BY MOUTH 4 (FOUR) TIMES DAILY., Disp: 360 tablet, Rfl: 2   insulin regular (NOVOLIN R) 100 units/mL injection, Inject 0-0.08 mLs (0-8 Units total) into the skin 3 (three) times daily before meals., Disp: 30 mL, Rfl: 11   Insulin Syringe-Needle U-100 (BD INSULIN SYRINGE U/F) 30G X 1/2" 0.5 ML MISC, USE UP TO 5X PER DAY WITH INSULIN  2X (NPH) AND 3X (REGULAR), Disp: 500 each, Rfl: 12   ipratropium-albuterol (DUONEB) 0.5-2.5 (3) MG/3ML SOLN, Take 3 mLs by nebulization 4 (four) times daily. Take scheduled 4 times daily until wheezing and shortness of breath from RSV improves. Then take as needed., Disp: , Rfl:    LORazepam (ATIVAN) 1 MG tablet, Take 0.5 tablets (0.5 mg total) by mouth daily as needed for anxiety. Inform pt 0.5 dose on backorder has to cut this in 1/2 dose, Disp: 15 tablet, Rfl: 0   Multiple Vitamins-Minerals (CENTRUM SILVER 50+WOMEN) TABS, Take 1 tablet by mouth daily with breakfast., Disp: , Rfl:    nebivolol (BYSTOLIC) 10 MG tablet, TAKE 1 TABLET BY MOUTH EVERY DAY, Disp: 90 tablet, Rfl: 0   nystatin (MYCOSTATIN/NYSTOP) powder, Apply topically 2 (two) times daily. Under abdomen, Disp: 60 g, Rfl: 11   polyethylene glycol powder (GLYCOLAX/MIRALAX) 17 GM/SCOOP powder, Take 17 g by mouth daily as needed for moderate constipation or severe constipation. Can take up to 2x per day prn. Mix with 8 ounces of liquid, Disp: 3350 g, Rfl: 11   rosuvastatin (CRESTOR) 20 MG tablet, TAKE 1 TABLET BY MOUTH EVERY DAY, Disp: 90 tablet, Rfl: 3   spironolactone (ALDACTONE) 25 MG tablet, Take 25 mg by mouth 3 (three) times a week. Monday Wednesday and friday, Disp: , Rfl:    torsemide (DEMADEX) 10 MG tablet, Take 1 tablet (10 mg total) by mouth daily., Disp: 90 tablet, Rfl: 3   valsartan (DIOVAN) 160 MG tablet, TAKE 1 TABLET BY MOUTH EVERY DAY, Disp: 90 tablet, Rfl: 0   cefadroxil (DURICEF) 500 MG capsule, Take 1 capsule (500 mg total) by mouth 2 (two) times daily for 5 days., Disp: 10 capsule, Rfl: 0   cloNIDine (CATAPRES) 0.1 MG tablet, Take 0.1 mg by mouth every 8 (eight) hours., Disp: , Rfl:    Dextromethorphan-guaiFENesin (MUCUS RELIEF DM PO), Take by mouth. Liquid cough syrup (Patient not taking: Reported on 08/01/2023), Disp: , Rfl:    gabapentin (NEURONTIN) 100 MG capsule, Take 1 capsule by mouth daily., Disp: , Rfl:     guaiFENesin (SCOT-TUSSIN EXPECTORANT) 100 MG/5ML liquid, Take 20 mLs by mouth 3 (three) times daily as needed for cough., Disp: , Rfl:    insulin NPH Human (NOVOLIN N) 100 UNIT/ML injection, Inject 15-20 Units into the skin 2 (two) times daily before a meal. Inject 20 units in the morning an 15 units in the evening., Disp: , Rfl:   Observations/Objective:  Today's Vitals   08/01/23 1013  Weight: 256 lb (116.1 kg)  Height: 5\' 3"  (1.6 m)   Body mass index is 45.35 kg/m.   Physical Exam Pulmonary:     Effort: Pulmonary effort is normal.  Neurological:     Mental Status: She is alert and oriented to person, place, and time. Mental status is at baseline.  Psychiatric:        Mood and Affect: Mood normal.        Behavior: Behavior normal.  Thought Content: Thought content normal.        Judgment: Judgment normal.    Assessment and Plan: Type 2 diabetes mellitus without complication, with long-term current use of insulin (HCC) Assessment & Plan: Continue current medications Follow-up with endocrinology as scheduled.   Hypertension associated with diabetes (HCC) Assessment & Plan: Blood pressure recorded at 150/100 without medication. Patient reported taking medication after the reading. Currently asymptomatic -Advise patient to monitor blood pressure regularly. -Continue Clonidine 0.1 mg q8h -Continue Nebivilol 10 mg daily -Continue Aldactone 25 mg daily -Continue Torsemide 10 mg daily -Continue Valsartan 160 mg daily -Follows with Nephrology and Cardiology     RSV infection Assessment & Plan: Recently hospitalized for 4 days. Currently experiencing shortness of breath, particularly at night, and persistent cough. Oxygen saturation at 96% and using DuoNeb treatments three times a day. -Continue DuoNeb treatments as needed. -Obtain an oxygen monitor to ensure oxygen saturation remains above 92%. -If any worsening symptoms, difficulty breathing or lowering oxygen  saturations patient aware to return to hospital    Mood disorder [facility]) Assessment & Plan: Patient's daughter reports patient has anxiety medication but is reluctant to take it due to side effects. -Advise patient to use anxiety medication as needed for anxiety attacks. -Continue Lorazepam 0.5 mg daily as needed.  No refill requested at this time. PDMP reviewed and appropriate    Follow-up Patient to schedule an in-person appointment once transportation is arranged.    Follow Up Instructions: Return if symptoms worsen or fail to improve, for PCP.   I discussed the assessment and treatment plan with the patient. The patient was provided an opportunity to ask questions and all were answered. The patient agreed with the plan and demonstrated an understanding of the instructions.   The patient was advised to call back or seek an in-person evaluation if the symptoms worsen or if the condition fails to improve as anticipated.  The above assessment and management plan was discussed with the patient. The patient verbalized understanding of and has agreed to the management plan. Patient is aware to call the clinic if symptoms persist or worsen. Patient is aware when to return to the clinic for a follow-up visit. Patient educated on when it is appropriate to go to the emergency department.    [name], MD

## 2023-08-02 ENCOUNTER — Telehealth: Payer: Self-pay

## 2023-08-02 NOTE — Telephone Encounter (Signed)
Copied from CRM [id number]. Topic: Clinical - Home Health Verbal Orders >> Aug 02, 2023  4:55 PM Fuller Mandril wrote: Caller/Agency: Rosey Bath with [employer] [facility] [employer]  Callback Number: [phone] Service Requested: N/A Received request for Hospital bed. Request did not include narrative notes from ordering physician and the reason why the bed is needed. She will also fax request  Frequency: N/A Any new concerns about the patient? N/A

## 2023-08-02 NOTE — Telephone Encounter (Signed)
Spoke to  granddaughter and they have decided to go with another company.  She informed me that pt is going to use [facility] Fax number is [phone]. I have faxed over the order and the demographic for pt.

## 2023-08-03 ENCOUNTER — Inpatient Hospital Stay: Payer: Medicare Other

## 2023-08-03 ENCOUNTER — Emergency Department: Payer: Medicare Other

## 2023-08-03 ENCOUNTER — Other Ambulatory Visit: Payer: Self-pay

## 2023-08-03 ENCOUNTER — Observation Stay
Admission: EM | Admit: 2023-08-03 | Discharge: 2023-08-04 | Disposition: A | Discharge status: Home / Self Care | Payer: Medicare Other | Attending: Obstetrics and Gynecology | Admitting: Obstetrics and Gynecology

## 2023-08-03 DIAGNOSIS — E1142 Type 2 diabetes mellitus with diabetic polyneuropathy: Secondary | ICD-10-CM | POA: Diagnosis present

## 2023-08-03 DIAGNOSIS — E669 Obesity, unspecified: Secondary | ICD-10-CM | POA: Insufficient documentation

## 2023-08-03 DIAGNOSIS — R1032 Left lower quadrant pain: Secondary | ICD-10-CM | POA: Diagnosis not present

## 2023-08-03 DIAGNOSIS — R52 Pain, unspecified: Secondary | ICD-10-CM | POA: Diagnosis present

## 2023-08-03 DIAGNOSIS — S0990XA Unspecified injury of head, initial encounter: Principal | ICD-10-CM | POA: Insufficient documentation

## 2023-08-03 DIAGNOSIS — Z6830 Body mass index (BMI) 30.0-30.9, adult: Secondary | ICD-10-CM | POA: Insufficient documentation

## 2023-08-03 DIAGNOSIS — N390 Urinary tract infection, site not specified: Secondary | ICD-10-CM | POA: Diagnosis not present

## 2023-08-03 DIAGNOSIS — L03119 Cellulitis of unspecified part of limb: Secondary | ICD-10-CM | POA: Diagnosis not present

## 2023-08-03 DIAGNOSIS — W2209XA Striking against other stationary object, initial encounter: Secondary | ICD-10-CM | POA: Diagnosis not present

## 2023-08-03 DIAGNOSIS — I5032 Chronic diastolic (congestive) heart failure: Secondary | ICD-10-CM | POA: Diagnosis not present

## 2023-08-03 DIAGNOSIS — L0390 Cellulitis, unspecified: Secondary | ICD-10-CM | POA: Diagnosis present

## 2023-08-03 DIAGNOSIS — E1169 Type 2 diabetes mellitus with other specified complication: Secondary | ICD-10-CM | POA: Diagnosis present

## 2023-08-03 DIAGNOSIS — E1129 Type 2 diabetes mellitus with other diabetic kidney complication: Secondary | ICD-10-CM | POA: Diagnosis present

## 2023-08-03 DIAGNOSIS — I739 Peripheral vascular disease, unspecified: Secondary | ICD-10-CM | POA: Diagnosis present

## 2023-08-03 DIAGNOSIS — I130 Hypertensive heart and chronic kidney disease with heart failure and stage 1 through stage 4 chronic kidney disease, or unspecified chronic kidney disease: Secondary | ICD-10-CM | POA: Insufficient documentation

## 2023-08-03 DIAGNOSIS — S8012XA Contusion of left lower leg, initial encounter: Secondary | ICD-10-CM | POA: Diagnosis not present

## 2023-08-03 DIAGNOSIS — E1159 Type 2 diabetes mellitus with other circulatory complications: Secondary | ICD-10-CM | POA: Diagnosis present

## 2023-08-03 DIAGNOSIS — Z79899 Other long term (current) drug therapy: Secondary | ICD-10-CM | POA: Insufficient documentation

## 2023-08-03 DIAGNOSIS — E1122 Type 2 diabetes mellitus with diabetic chronic kidney disease: Secondary | ICD-10-CM | POA: Diagnosis not present

## 2023-08-03 DIAGNOSIS — Z7982 Long term (current) use of aspirin: Secondary | ICD-10-CM | POA: Insufficient documentation

## 2023-08-03 DIAGNOSIS — N1832 Chronic kidney disease, stage 3b: Secondary | ICD-10-CM | POA: Diagnosis present

## 2023-08-03 DIAGNOSIS — R109 Unspecified abdominal pain: Secondary | ICD-10-CM | POA: Diagnosis present

## 2023-08-03 DIAGNOSIS — N182 Chronic kidney disease, stage 2 (mild): Secondary | ICD-10-CM | POA: Diagnosis present

## 2023-08-03 LAB — URINALYSIS, ROUTINE W REFLEX MICROSCOPIC
Bilirubin Urine: NEGATIVE
Glucose, UA: >=500 mg/dL — AB
Ketones, ur: NEGATIVE mg/dL
Nitrite: NEGATIVE
Protein, ur: >=300 mg/dL — AB
Specific Gravity, Urine: 1.02 (ref 1.005–1.030)
WBC, UA: >50 WBC/hpf (ref 0–5)
pH: 5 (ref 5.0–8.0)

## 2023-08-03 LAB — BASIC METABOLIC PANEL
Anion gap: 11 (ref 5–15)
BUN: 20 mg/dL (ref 8–23)
CO2: 24 mmol/L (ref 22–32)
Calcium: 9.1 mg/dL (ref 8.9–10.3)
Chloride: 101 mmol/L (ref 98–111)
Creatinine, Ser: 1.4 mg/dL — ABNORMAL HIGH (ref 0.44–1.00)
GFR, Estimated: 37 mL/min — ABNORMAL LOW (ref >60)
Glucose, Bld: 463 mg/dL — ABNORMAL HIGH (ref 70–99)
Potassium: 4.3 mmol/L (ref 3.5–5.1)
Sodium: 136 mmol/L (ref 135–145)

## 2023-08-03 LAB — CBC
HCT: 34.1 % — ABNORMAL LOW (ref 36.0–46.0)
Hemoglobin: 10.7 g/dL — ABNORMAL LOW (ref 12.0–15.0)
MCH: 28.6 pg (ref 26.0–34.0)
MCHC: 31.4 g/dL (ref 30.0–36.0)
MCV: 91.2 fL (ref 80.0–100.0)
Platelets: 538 10*3/uL — ABNORMAL HIGH (ref 150–400)
RBC: 3.74 MIL/uL — ABNORMAL LOW (ref 3.87–5.11)
RDW: 13.2 % (ref 11.5–15.5)
WBC: 8.4 10*3/uL (ref 4.0–10.5)
nRBC: 0 % (ref 0.0–0.2)

## 2023-08-03 MED ORDER — ROSUVASTATIN CALCIUM 20 MG PO TABS
20.0000 mg | ORAL_TABLET | Freq: Every day | ORAL | Status: DC
Start: 2023-08-04 — End: 2023-08-04
  Administered 2023-08-04: 20 mg via ORAL
  Filled 2023-08-03: qty 1

## 2023-08-03 MED ORDER — INSULIN ASPART PROT & ASPART (70-30 MIX) 100 UNIT/ML ~~LOC~~ SUSP
20.0000 [IU] | Freq: Two times a day (BID) | SUBCUTANEOUS | Status: DC
  Administered 2023-08-04: 20 [IU] via SUBCUTANEOUS
  Filled 2023-08-03: qty 10

## 2023-08-03 MED ORDER — IPRATROPIUM-ALBUTEROL 0.5-2.5 (3) MG/3ML IN SOLN: 3.0000 mL | RESPIRATORY_TRACT | Status: DC | PRN

## 2023-08-03 MED ORDER — HEPARIN SODIUM (PORCINE) 5000 UNIT/ML IJ SOLN
5000.0000 [IU] | Freq: Three times a day (TID) | INTRAMUSCULAR | Status: DC
  Administered 2023-08-04 (×3): 5000 [IU] via SUBCUTANEOUS
  Filled 2023-08-03 (×3): qty 1

## 2023-08-03 MED ORDER — ACETAMINOPHEN 500 MG PO TABS
750.0000 mg | ORAL_TABLET | Freq: Three times a day (TID) | ORAL | Status: DC
  Administered 2023-08-04 (×3): 750 mg via ORAL
  Filled 2023-08-03 (×3): qty 2

## 2023-08-03 MED ORDER — ACETAMINOPHEN 500 MG PO TABS: 500.0000 mg | ORAL_TABLET | Freq: Four times a day (QID) | ORAL | Status: DC | PRN

## 2023-08-03 MED ORDER — HYDRALAZINE HCL 50 MG PO TABS
100.0000 mg | ORAL_TABLET | Freq: Four times a day (QID) | ORAL | Status: DC
  Administered 2023-08-04 (×3): 100 mg via ORAL
  Filled 2023-08-03 (×3): qty 2

## 2023-08-03 MED ORDER — TORSEMIDE 20 MG PO TABS: 10.0000 mg | ORAL_TABLET | Freq: Every day | ORAL | Status: DC

## 2023-08-03 MED ORDER — TRAMADOL HCL 50 MG PO TABS: 100.0000 mg | ORAL_TABLET | Freq: Four times a day (QID) | ORAL | Status: DC | PRN

## 2023-08-03 MED ORDER — POLYETHYLENE GLYCOL 3350 17 G PO PACK: 17.0000 g | PACK | Freq: Every day | ORAL | Status: DC | PRN

## 2023-08-03 MED ORDER — ASPIRIN 81 MG PO TBEC: 81.0000 mg | DELAYED_RELEASE_TABLET | ORAL | Status: DC

## 2023-08-03 MED ORDER — LORAZEPAM 0.5 MG PO TABS: 0.5000 mg | ORAL_TABLET | Freq: Every day | ORAL | Status: DC | PRN

## 2023-08-03 MED ORDER — HYDRALAZINE HCL 50 MG PO TABS
100.0000 mg | ORAL_TABLET | Freq: Once | ORAL | Status: AC
  Administered 2023-08-03: 100 mg via ORAL
  Filled 2023-08-03: qty 2

## 2023-08-03 MED ORDER — BUDESONIDE 0.5 MG/2ML IN SUSP
0.5000 mg | Freq: Every day | RESPIRATORY_TRACT | Status: DC
Start: 2023-08-04 — End: 2023-08-04
  Administered 2023-08-04: 0.5 mg via RESPIRATORY_TRACT
  Filled 2023-08-03: qty 2

## 2023-08-03 MED ORDER — OXYCODONE HCL 5 MG PO TABS
5.0000 mg | ORAL_TABLET | Freq: Once | ORAL | Status: AC
  Administered 2023-08-03: 5 mg via ORAL
  Filled 2023-08-03: qty 1

## 2023-08-03 MED ORDER — NYSTATIN 100000 UNIT/GM EX POWD
Freq: Two times a day (BID) | CUTANEOUS | Status: DC | PRN
Start: 2023-08-03 — End: 2023-08-04

## 2023-08-03 MED ORDER — MORPHINE SULFATE (PF) 4 MG/ML IV SOLN
4.0000 mg | Freq: Once | INTRAVENOUS | Status: AC
  Administered 2023-08-03: 4 mg via INTRAVENOUS
  Filled 2023-08-03: qty 1

## 2023-08-03 MED ORDER — CIPROFLOXACIN HCL 500 MG PO TABS
500.0000 mg | ORAL_TABLET | Freq: Two times a day (BID) | ORAL | Status: DC
Start: 2023-08-03 — End: 2023-08-04
  Administered 2023-08-04: 500 mg via ORAL
  Filled 2023-08-03: qty 1

## 2023-08-03 MED ORDER — CLONIDINE HCL 0.1 MG PO TABS
0.2000 mg | ORAL_TABLET | Freq: Once | ORAL | Status: AC
  Administered 2023-08-03: 0.2 mg via ORAL
  Filled 2023-08-03: qty 2

## 2023-08-03 MED ORDER — GABAPENTIN 100 MG PO CAPS
100.0000 mg | ORAL_CAPSULE | Freq: Two times a day (BID) | ORAL | Status: DC
  Administered 2023-08-04: 100 mg via ORAL
  Filled 2023-08-03: qty 1

## 2023-08-03 MED ORDER — INSULIN ASPART 100 UNIT/ML IJ SOLN
0.0000 [IU] | Freq: Three times a day (TID) | INTRAMUSCULAR | Status: DC
  Administered 2023-08-04: 7 [IU] via SUBCUTANEOUS
  Filled 2023-08-03: qty 1

## 2023-08-03 MED ORDER — NEBIVOLOL HCL 10 MG PO TABS
10.0000 mg | ORAL_TABLET | Freq: Every day | ORAL | Status: DC
  Administered 2023-08-04: 10 mg via ORAL
  Filled 2023-08-03: qty 1

## 2023-08-03 MED ORDER — BENZONATATE 100 MG PO CAPS: 200.0000 mg | ORAL_CAPSULE | Freq: Three times a day (TID) | ORAL | Status: DC | PRN

## 2023-08-03 MED ORDER — METRONIDAZOLE 500 MG PO TABS
500.0000 mg | ORAL_TABLET | Freq: Two times a day (BID) | ORAL | Status: DC
  Administered 2023-08-04: 500 mg via ORAL
  Filled 2023-08-03: qty 1

## 2023-08-03 MED ORDER — SPIRONOLACTONE 25 MG PO TABS: 25.0000 mg | ORAL_TABLET | ORAL | Status: DC

## 2023-08-03 MED ORDER — GABAPENTIN 300 MG PO CAPS
300.0000 mg | ORAL_CAPSULE | Freq: Every day | ORAL | Status: DC
Start: 2023-08-03 — End: 2023-08-04

## 2023-08-03 MED ORDER — IRBESARTAN 150 MG PO TABS
150.0000 mg | ORAL_TABLET | Freq: Every day | ORAL | Status: DC
  Administered 2023-08-04: 150 mg via ORAL
  Filled 2023-08-03: qty 1

## 2023-08-03 MED ORDER — TORSEMIDE 20 MG PO TABS
20.0000 mg | ORAL_TABLET | Freq: Every day | ORAL | Status: DC
  Administered 2023-08-04: 20 mg via ORAL
  Filled 2023-08-03: qty 1

## 2023-08-03 MED ORDER — CLONIDINE HCL 0.1 MG PO TABS
0.2000 mg | ORAL_TABLET | Freq: Two times a day (BID) | ORAL | Status: DC
  Administered 2023-08-04: 0.2 mg via ORAL
  Filled 2023-08-03: qty 2

## 2023-08-03 MED ORDER — TRAMADOL HCL 50 MG PO TABS
50.0000 mg | ORAL_TABLET | Freq: Once | ORAL | Status: AC
  Administered 2023-08-03: 50 mg via ORAL
  Filled 2023-08-03: qty 1

## 2023-08-03 MED ORDER — ONDANSETRON HCL 4 MG/2ML IJ SOLN
4.0000 mg | Freq: Once | INTRAMUSCULAR | Status: AC
  Administered 2023-08-03: 4 mg via INTRAVENOUS
  Filled 2023-08-03: qty 2

## 2023-08-03 NOTE — H&P (Signed)
History and Physical    Sherica Paternostro ZOX:[id number] DOB: [birth year] DOA: 08/03/2023  DOS: the patient was seen and examined on 08/03/2023  PCP: [name], MD   Patient coming from: Home  I have personally briefly reviewed patient's old medical records in [facility]  Taisa Pyper is a 83 y.o. female with a past medical history of monoclonal B-cell lymphoma, stage III CKD, type 2 diabetes, peripheral neuropathy,  hypertension, heart failure with preserved EF , chronic pain, obesity, immobility. She was recently hospitalized for RSV pneumonia early Feb and has remained SOB. HH nursing recently noted out of control hypertension.  Patient had a mechanical fall Friday evening when up to bathroom. The morning of 08/03/23 she had increased diffuse pain, abdominal pain and persistent SOB. She presents to [facility] for evaluation.    ED Course: T 99.8  197/59  HR 73  RR 18. Elderly obese woman who complains of generalized pain but is not in distress. Lab: Glucose 463, Cr 1.4 (baseline). WBC 8.4, no differential, Hgb 10.7 - chroinc. U/A Small LE, Prot > 300, rare bacteria, >50 WBC/hpf. CT head/c-spine w/o fracture. X-ray left shoulder, left tib/fib w/o fracture. Due to poor pain control and HTN TRH called to admit patient for further diagnostic work up and treatment.   Review of Systems:  Review of Systems  Constitutional:  Positive for malaise/fatigue. Negative for chills, fever and weight loss.  HENT: Negative.    Eyes: Negative.   Respiratory:  Positive for shortness of breath. Negative for cough, sputum production and wheezing.   Cardiovascular:  Positive for leg swelling. Negative for chest pain and palpitations.  Gastrointestinal:  Positive for abdominal pain. Negative for nausea and vomiting.  Genitourinary:  Positive for frequency and urgency. Negative for dysuria and flank pain.  Musculoskeletal:  Positive for falls, joint pain, myalgias and neck pain.  Skin: Negative.    Neurological:  Positive for weakness.  Endo/Heme/Allergies:  Negative for polydipsia. Does not bruise/bleed easily.  Psychiatric/Behavioral: Negative.      Past Medical History:  Diagnosis Date   Anxiety 09/13/2015   Arthritis    Atypical chest pain 01/15/2021   Blackhead 10/24/2021   Bradycardia 07/17/2016   Formatting of this note might be different from the original.  Last Assessment & Plan:   Found to be bradycardic at outside hospital. They stopped metoprolol and verapamil and heart rate has been normal since then. Echo appears to have been reassuring at the outside facility. Appears to be doing much better with regards to this. She'll continue to monitor for recurrent symptoms. 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  Last Assessment & Plan:   Continues to have issues with this.  She is following with a specialist and it sounds as though they are planning on an MRI.  Benign exam today.   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 Loletha Carrow of this note might be different from the original.  Dr. Thedore Mins      Last Assessment & Plan:   Recheck kidney function today.   Constipation 11/17/2019   Cough 01/25/2016   Formatting of this note might be different from the original.  Last Assessment & Plan:   Minimal nighttime cough. Improves when she washes her pillows consistently. Suspect allergies contributing. She'll monitor.   Diabetes mellitus without complication (HCC)  one elevated reading/ no treatment   Disorder of rotator cuff 06/24/2018   Diverticulitis    Dysuria 03/22/2017   Formatting of this note might be different from the original.  Last Assessment & Plan:   Symptoms concerning for UTI.  Will check urinalysis.   Edema of foot 04/08/2014   Formatting of this note might be different from the original.  Last Assessment & Plan:   Chronic pedal  edema. Suspect venous insufficiency. No orthopnea or shortness of breath. Advised elevation of her legs. Consider compression stockings in the future.   Elevated troponin 12/15/2020   Essential hypertension 04/08/2014   Formatting of this note might be different from the original.  Last Assessment & Plan:   Well-controlled on recheck.  Continue current regimen.   Fall 03/26/2018   Gastroenteritis 08/12/2021   Gastrointestinal hemorrhage 08/03/2015   GI bleed    High cholesterol    History of blood transfusion    Hyperglycemia due to type 2 diabetes mellitus (HCC) 03/11/2015   Hypertension    Hypertensive urgency 12/15/2020   Low back pain 04/08/2014   Morbid obesity (HCC) 04/08/2014   Formatting of this note might be different from the original.  Last Assessment & Plan:   Weight is stable. Discussed diet and exercise at length. Encouraged whatever exercise she can do. Given diet information.   Palpitations 12/15/2020   Peripheral edema 12/22/2019   Postmenopausal bleeding 07/17/2016   Formatting of this note might be different from the original.  Last Assessment & Plan:   Recent D&C. Following with gynecology. Benign findings. Monitor for recurrence.   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  Last Assessment & Plan:   Continues to have issues with this.  She is following with a specialist and it sounds as though they are planning on an MRI.  Benign exam today.   Recurrent falls 08/13/2019   Renal insufficiency    Skin cyst 10/24/2021   Stage 3a chronic kidney disease (HCC) 01/15/2021   Swelling of lower leg 07/19/2016   Vertigo 10/13/2015   Formatting of this note might be different from the original.  Last Assessment & Plan:   No recurrence. Discussed that meclizine is an as needed medication and that she does not need to take this daily.  She will continue to monitor.    Past Surgical History:  Procedure Laterality Date   APPENDECTOMY     CHOLECYSTECTOMY     ECTOPIC PREGNANCY SURGERY     EYE SURGERY     bilateral cataracts   EYE SURGERY     02/11/2019 repair hole in right eye    gallbladder      HYSTEROSCOPY WITH D & C N/A 10/26/2016   Procedure: DILATATION AND CURETTAGE /HYSTEROSCOPY;  Surgeon: Christeen Douglas, MD;  Location: [facility];  Service: Gynecology;  Laterality: N/A;   HYSTEROSCOPY WITH D & C N/A 07/07/2018   Procedure: DILATATION AND CURETTAGE /HYSTEROSCOPY;  Surgeon: Christeen Douglas, MD;  Location: [facility];  Service: Gynecology;  Laterality: N/A;   THYROIDECTOMY, PARTIAL      Soc Hx - widowed 11 years after 50+ years of marriage. One daughter, 5 grands, 5 great-grands. Lives with daughter.    reports that she has never smoked. She has never used smokeless tobacco. She reports that she does not drink alcohol and does not use drugs.  Allergies  Allergen  Reactions   Celexa [Citalopram]     Diarrhea upset stomach    Jardiance [Empagliflozin] Other (See Comments)    Reaction not recalled   Norvasc [Amlodipine]     Leg edema   Tape Other (See Comments)    Leaves the skin "raw" if left on for a period of time- tolerates paper tape   Penicillin V Rash   Penicillin V Potassium Rash   Penicillins Rash    Has patient had a PCN reaction causing immediate rash, facial/tongue/throat swelling, SOB or lightheadedness with hypotension: Yes Has patient had a PCN reaction causing severe rash involving mucus membranes or skin necrosis: No Has patient had a PCN reaction that required hospitalization No Has patient had a PCN reaction occurring within the last 10 years: Yes If all of the above answers are "NO", then may proceed with Cephalosporin use.     Family History  Problem Relation Age of Onset   Diabetes Mother    Hypertension Mother    Stroke Mother    Diabetes Other    Healthy Father    Diabetes  Sister    Heart disease Sister     Prior to Admission medications   Medication Sig Start Date End Date Taking? Authorizing Provider  acetaminophen (TYLENOL) 500 MG tablet Take 500 mg by mouth every 6 (six) hours as needed for mild pain or headache.    [provider]  Ascorbic Acid (VITAMIN C PO) Take 1 tablet by mouth daily.    [provider]  aspirin EC 81 MG tablet Take 81 mg by mouth 2 (two) times a week. Swallow whole.    [provider]  benzonatate (TESSALON) 200 MG capsule Take 200 mg by mouth 3 (three) times daily as needed for cough.    [provider]  blood glucose meter kit and supplies KIT Accu chek, Dx code E11.65, check 3 times daily 07/26/17   Glori Luis, MD  Blood Pressure KIT 1 Device by Does not apply route daily. 10/24/21   McLean-Scocuzza, Pasty Spillers, MD  budesonide (PULMICORT) 0.5 MG/2ML nebulizer solution Take 0.5 mg by nebulization daily. 07/26/23 08/02/23  [provider]  cloNIDine (CATAPRES) 0.2 MG tablet Take 1 tablet (0.2 mg total) by mouth 2 (two) times daily. 12/18/21   Antonieta Iba, MD  Dextromethorphan-guaiFENesin (MUCUS RELIEF DM PO) Take by mouth. Liquid cough syrup Patient not taking: Reported on 08/01/2023    [provider]  glucose blood (ACCU-CHEK GUIDE) test strip USE AS INSTRUCTED 3 TIMES DAILY 09/06/22   [name], MD  hydrALAZINE (APRESOLINE) 100 MG tablet TAKE 1 TABLET (100 MG TOTAL) BY MOUTH 4 (FOUR) TIMES DAILY. 09/11/22   Antonieta Iba, MD  insulin NPH Human (NOVOLIN N) 100 UNIT/ML injection INJECT 20 UNITS IN THE MORNING AND THE EVENING WITH A MEAL 04/10/21   McLean-Scocuzza, Pasty Spillers, MD  insulin regular (NOVOLIN R) 100 units/mL injection Inject 0-0.08 mLs (0-8 Units total) into the skin 3 (three) times daily before meals. 10/17/20   Romero Belling, MD  Insulin Syringe-Needle U-100 (BD INSULIN SYRINGE U/F) 30G X 1/2" 0.5 ML MISC USE UP TO 5X PER DAY WITH INSULIN 2X (NPH) AND 3X (REGULAR)  12/18/22   [name], MD  ipratropium-albuterol (DUONEB) 0.5-2.5 (3) MG/3ML SOLN Take 3 mLs by nebulization.    [provider]  LORazepam (ATIVAN) 1 MG tablet Take 0.5 tablets (0.5 mg total) by mouth daily as needed for anxiety. Inform pt 0.5 dose on backorder has  to cut this in 1/2 dose 06/27/23   Worthy Rancher B, FNP  Multiple Vitamins-Minerals (CENTRUM SILVER 50+WOMEN) TABS Take 1 tablet by mouth daily with breakfast.    [provider]  nebivolol (BYSTOLIC) 10 MG tablet TAKE 1 TABLET BY MOUTH EVERY DAY 06/27/23   Antonieta Iba, MD  nystatin (MYCOSTATIN/NYSTOP) powder Apply topically 2 (two) times daily. Under abdomen 02/04/23   [name], MD  polyethylene glycol powder (GLYCOLAX/MIRALAX) 17 GM/SCOOP powder Take 17 g by mouth daily as needed for moderate constipation or severe constipation. Can take up to 2x per day prn. Mix with 8 ounces of liquid 03/21/22   McLean-Scocuzza, Pasty Spillers, MD  rosuvastatin (CRESTOR) 20 MG tablet TAKE 1 TABLET BY MOUTH EVERY DAY 03/21/22   McLean-Scocuzza, Pasty Spillers, MD  spironolactone (ALDACTONE) 25 MG tablet Take 25 mg by mouth 3 (three) times a week. Monday Wednesday and friday    [provider]  torsemide (DEMADEX) 10 MG tablet Take 1 tablet (10 mg total) by mouth daily. 07/11/21   McLean-Scocuzza, Pasty Spillers, MD  valsartan (DIOVAN) 160 MG tablet TAKE 1 TABLET BY MOUTH EVERY DAY 07/16/23   Antonieta Iba, MD    Physical Exam: Vitals:   08/03/23 1926 08/03/23 2041 08/03/23 2132 08/03/23 2200  BP: (!) 211/62 (!) 197/59 (!) 184/53 (!) 184/51  Pulse: 78 73  74  Resp: 20 18  18   Temp:  99.8 F (37.7 C)    TempSrc:  Oral    SpO2: 97% 94%  92%  Weight:      Height:        Physical Exam Vitals and nursing note reviewed.  Constitutional:      General: She is not in acute distress.    Appearance: She is obese. She is ill-appearing. She is not toxic-appearing.  HENT:     Head: Normocephalic and atraumatic.     Mouth/Throat:      Mouth: Mucous membranes are moist.     Comments: Missing teeth maxilla and mandible Eyes:     General: No scleral icterus.    Extraocular Movements: Extraocular movements intact.     Pupils: Pupils are equal, round, and reactive to light.  Cardiovascular:     Rate and Rhythm: Normal rate and regular rhythm.     Heart sounds: Normal heart sounds.     Comments: Distant heart sounds Pulmonary:     Effort: Pulmonary effort is normal.     Breath sounds: No wheezing or rhonchi.     Comments: Decreased breath sounds bibasilar R>L, no frank rales Abdominal:     General: There is no distension.     Palpations: Abdomen is soft.     Tenderness: There is abdominal tenderness. There is guarding.     Comments: Morbidly obese. Hypoactive BS. Diffusely tender but worst at LLQ with mild guarding, no rebound.   Musculoskeletal:        General: Swelling and tenderness present. Normal range of motion.     Cervical back: Normal range of motion and neck supple.     Comments: No no deformity PIP, DIP joint. 3+ pitting edema to knees with marked tenderness. Very tender to palpation left calve.  Skin:    General: Skin is warm.     Findings: Erythema present.     Comments: Chronic skin changes from peripheral edema. Distal left LE, just at sock line, with marked erythema, rubor, calore, dolore.   Neurological:     Mental Status: She is alert  and oriented to person, place, and time.     Cranial Nerves: No cranial nerve deficit, dysarthria or facial asymmetry.     Comments: Decreased sensation plantar aspect both feet.  Psychiatric:        Mood and Affect: Mood is anxious.        Speech: Speech normal.        Behavior: Behavior normal.      Labs on Admission: I have personally reviewed following labs and imaging studies  CBC: Recent Labs  Lab 08/03/23 1938  WBC 8.4  HGB 10.7*  HCT 34.1*  MCV 91.2  PLT 538*   Basic Metabolic Panel: Recent Labs  Lab 08/03/23 1938  NA 136  K 4.3  CL 101   CO2 24  GLUCOSE 463*  BUN 20  CREATININE 1.40*  CALCIUM 9.1   GFR: Estimated Creatinine Clearance: 36.9 mL/min (A) (by C-G formula based on SCr of 1.4 mg/dL (H)). Liver Function Tests: No results for input(s): "AST", "ALT", "ALKPHOS", "BILITOT", "PROT", "ALBUMIN" in the last 168 hours. No results for input(s): "LIPASE", "AMYLASE" in the last 168 hours. No results for input(s): "AMMONIA" in the last 168 hours. Coagulation Profile: No results for input(s): "INR", "PROTIME" in the last 168 hours. Cardiac Enzymes: No results for input(s): "CKTOTAL", "CKMB", "CKMBINDEX", "TROPONINI" in the last 168 hours. BNP (last 3 results) No results for input(s): "PROBNP" in the last 8760 hours. HbA1C: Recent Labs    08/01/23 0000  HGBA1C 9.7   CBG: No results for input(s): "GLUCAP" in the last 168 hours. Lipid Profile: No results for input(s): "CHOL", "HDL", "LDLCALC", "TRIG", "CHOLHDL", "LDLDIRECT" in the last 72 hours. Thyroid Function Tests: No results for input(s): "TSH", "T4TOTAL", "FREET4", "T3FREE", "THYROIDAB" in the last 72 hours. Anemia Panel: No results for input(s): "VITAMINB12", "FOLATE", "FERRITIN", "TIBC", "IRON", "RETICCTPCT" in the last 72 hours. Urine analysis:    Component Value Date/Time   COLORURINE YELLOW (A) 08/03/2023 1828   APPEARANCEUR HAZY (A) 08/03/2023 1828   APPEARANCEUR Clear 10/22/2018 0844   LABSPEC 1.020 08/03/2023 1828   PHURINE 5.0 08/03/2023 1828   GLUCOSEU >=500 (A) 08/03/2023 1828   HGBUR SMALL (A) 08/03/2023 1828   BILIRUBINUR NEGATIVE 08/03/2023 1828   BILIRUBINUR Negative 10/22/2018 0844   KETONESUR NEGATIVE 08/03/2023 1828   PROTEINUR >=300 (A) 08/03/2023 1828   UROBILINOGEN 0.2 03/09/2020 1022   NITRITE NEGATIVE 08/03/2023 1828   LEUKOCYTESUR TRACE (A) 08/03/2023 1828    Radiological Exams on Admission: I have personally reviewed images CT Head Wo Contrast Result Date: 08/03/2023 CLINICAL DATA:  Neck trauma (Age >= 65y); Headache, post  traumatic EXAM: CT HEAD WITHOUT CONTRAST CT CERVICAL SPINE WITHOUT CONTRAST TECHNIQUE: Multidetector CT imaging of the head and cervical spine was performed following the standard protocol without intravenous contrast. Multiplanar CT image reconstructions of the cervical spine were also generated. RADIATION DOSE REDUCTION: This exam was performed according to the departmental dose-optimization program which includes automated exposure control, adjustment of the mA and/or kV according to patient size and/or use of iterative reconstruction technique. COMPARISON:  CT scan cervical spine from 12/12/2017. FINDINGS: CT HEAD FINDINGS Brain: No evidence of acute infarction, hemorrhage, hydrocephalus, extra-axial collection or mass lesion/mass effect. There is bilateral periventricular hypodensity, which is non-specific but most likely seen in the settings of microvascular ischemic changes. Mild-to-moderate in extent. Otherwise normal appearance of brain parenchyma. Ventricles are normal. Cerebral volume is age appropriate. Vascular: No hyperdense vessel or unexpected calcification. Intracranial arteriosclerosis. Skull: Normal. Negative for fracture or focal lesion.  Sinuses/Orbits: No acute finding. Mild mucoperiosteal thickening noted in the left chamber of the sphenoid sinus. No air-fluid levels. Other: Visualized mastoid air cells are unremarkable. No mastoid effusion. CT CERVICAL SPINE FINDINGS Alignment: There is reversal of cervical lordosis. This examination does not assess for ligamentous injury or stability. Skull base and vertebrae: No acute fracture. No primary bone lesion or focal pathologic process. Soft tissues and spinal canal: No prevertebral fluid or swelling. No visible canal hematoma. Disc levels: Mildly reduced C2-3 and C5-C6 intervertebral disc heights. Remaining intervertebral disc heights are maintained. Mild facet arthropathy and minimal marginal osteophyte formation. Upper chest: Negative. Other:  Probably surgically absent right thyroid lobe. Correlate with surgical history. Heterogeneous left thyroid lobe, which is not well evaluated on the CT scan exam. IMPRESSION: 1. No acute intracranial abnormality. 2. No acute osseous injury or traumatic listhesis of the cervical spine. Electronically Signed   By: Jules Schick M.D.   On: 08/03/2023 15:29   CT Cervical Spine Wo Contrast Result Date: 08/03/2023 CLINICAL DATA:  Neck trauma (Age >= 65y); Headache, post traumatic EXAM: CT HEAD WITHOUT CONTRAST CT CERVICAL SPINE WITHOUT CONTRAST TECHNIQUE: Multidetector CT imaging of the head and cervical spine was performed following the standard protocol without intravenous contrast. Multiplanar CT image reconstructions of the cervical spine were also generated. RADIATION DOSE REDUCTION: This exam was performed according to the departmental dose-optimization program which includes automated exposure control, adjustment of the mA and/or kV according to patient size and/or use of iterative reconstruction technique. COMPARISON:  CT scan cervical spine from 12/12/2017. FINDINGS: CT HEAD FINDINGS Brain: No evidence of acute infarction, hemorrhage, hydrocephalus, extra-axial collection or mass lesion/mass effect. There is bilateral periventricular hypodensity, which is non-specific but most likely seen in the settings of microvascular ischemic changes. Mild-to-moderate in extent. Otherwise normal appearance of brain parenchyma. Ventricles are normal. Cerebral volume is age appropriate. Vascular: No hyperdense vessel or unexpected calcification. Intracranial arteriosclerosis. Skull: Normal. Negative for fracture or focal lesion. Sinuses/Orbits: No acute finding. Mild mucoperiosteal thickening noted in the left chamber of the sphenoid sinus. No air-fluid levels. Other: Visualized mastoid air cells are unremarkable. No mastoid effusion. CT CERVICAL SPINE FINDINGS Alignment: There is reversal of cervical lordosis. This  examination does not assess for ligamentous injury or stability. Skull base and vertebrae: No acute fracture. No primary bone lesion or focal pathologic process. Soft tissues and spinal canal: No prevertebral fluid or swelling. No visible canal hematoma. Disc levels: Mildly reduced C2-3 and C5-C6 intervertebral disc heights. Remaining intervertebral disc heights are maintained. Mild facet arthropathy and minimal marginal osteophyte formation. Upper chest: Negative. Other: Probably surgically absent right thyroid lobe. Correlate with surgical history. Heterogeneous left thyroid lobe, which is not well evaluated on the CT scan exam. IMPRESSION: 1. No acute intracranial abnormality. 2. No acute osseous injury or traumatic listhesis of the cervical spine. Electronically Signed   By: Jules Schick M.D.   On: 08/03/2023 15:29   DG Shoulder Left Result Date: 08/03/2023 CLINICAL DATA:  Mechanical fall.  Pain. EXAM: LEFT SHOULDER - 2+ VIEW COMPARISON:  None Available. FINDINGS: No acute fracture or dislocation. No aggressive osseous lesion. Glenohumeral and acromioclavicular joints are normal in alignment and exhibits mild-to-moderate degenerative changes. No soft tissue swelling. No radiopaque foreign bodies. IMPRESSION: No acute osseous abnormality of the left shoulder. Electronically Signed   By: Jules Schick M.D.   On: 08/03/2023 15:23   DG Tibia/Fibula Left Result Date: 08/03/2023 CLINICAL DATA:  Pain post fall. EXAM: LEFT TIBIA AND FIBULA -  2 VIEW COMPARISON:  None Available. FINDINGS: No acute fracture or dislocation. No aggressive osseous lesion. There are degenerative changes of the knee joint in the form of reduced tibio-femoral compartment joint space (medial compartment more than lateral compartment), tibial spiking and tricompartmental osteophytosis. Ankle mortise is intact. Calcaneal spur noted along the Achilles tendon attachment site. No knee effusion or focal soft tissue swelling. No radiopaque  foreign bodies. IMPRESSION: *No acute osseous abnormality of the left leg. Electronically Signed   By: Jules Schick M.D.   On: 08/03/2023 15:21    EKG: I have personally reviewed EKG: no EKG done in ED  Assessment/Plan Active Problems:   UTI (urinary tract infection)   Diffuse pain   Abdominal pain   Stage 2 chronic kidney disease   Diabetic polyneuropathy (HCC)   Type 2 diabetes mellitus with other diabetic kidney complication (HCC)   Hypertension associated with diabetes (HCC)   Chronic diastolic CHF (congestive heart failure) (HCC)   Cellulitis   Abdominal pain, LLQ    Assessment and Plan: Abdominal pain Patient with abdominal tenderness on exam, worst in LLQ with mild guarding no rebound. BS hypoactive. Mildly febrile at 99.8, more significant in elderly immunocompromised patient. No leukocytosis. Concern for ileus, diverticulitis.  Plan KUB  If KUB not normal or inconclusive CT abdomen  Cipro, Flagyl    Diffuse pain Multi-factorial - see above problems  Plan Change APAP to 750 AC HS  Tramadol 100 mg q8 prn   UTI (urinary tract infection) Mild UTI based on U/A with small LE, rare bacteria, >50 WBC  Plan Cipro   Hypertension associated with diabetes (HCC) Per record review patient with frequent issues of uncontrolled HTN. Pain, UTI may be exacerbating feature.  Plan Continue home regimen  Type 2 diabetes mellitus with other diabetic kidney complication (HCC) Last A1C 9.7% Feb '25. At admission serum glucose 463 - possibly exacerbated by UTI, pain.  Plan Continue home NPH  SS coverage AC/HS  Diabetic polyneuropathy (HCC) Long term problem. Patient admits to burning pain in feet and distal LE  Plan Continue gabapentin to 100 mg AM, noon and 300 mg qHS  Stage 2 chronic kidney disease Cr 1.4 - at baseline.  Plan Avoid nephrotoxic drugs  Daily Bmet while on increased torsemide and tx for infection.  Chronic diastolic CHF (congestive heart failure)  (HCC) Patient with SOB, decreased BS bibasilar R>L, no frank rales. Marked peripheral edema.  Plan D-dimer - r/o PE with SOB, immobility, left calve tenderness  BNP - r/o exacerbation of HFpEF  Continue home medications with increase of torsemide to 20 mg daily - adjust as indicated by lab results  Cellulitis Patient with chronic pedal edema. Distal Left LE with calore dolore and rubor suggesting cellulitis  Plan Abx - cipro + flagyl       DVT prophylaxis: SQ Heparin Code Status: Full Code Family Communication: daughter present during exam  Disposition Plan: home when medically stable  Consults called: none  Admission status: Inpatient, Med-Surg   Illene Regulus, MD [facility] 08/03/2023, 11:42 PM

## 2023-08-03 NOTE — Assessment & Plan Note (Signed)
Cr 1.4 - at baseline.  Plan Avoid nephrotoxic drugs  Daily Bmet while on increased torsemide and tx for infection.

## 2023-08-03 NOTE — ED Notes (Signed)
2 unsuccessful attempts in left arm by this RN and 1 unsuccessful attempt by other RN. IV team consulted

## 2023-08-03 NOTE — Assessment & Plan Note (Signed)
Last A1C 9.7% Feb '25. At admission serum glucose 463 - possibly exacerbated by UTI, pain.  Plan Continue home NPH  SS coverage AC/HS

## 2023-08-03 NOTE — Assessment & Plan Note (Signed)
Patient with chronic pedal edema. Distal Left LE with calore dolore and rubor suggesting cellulitis  Plan Abx - cipro + flagyl

## 2023-08-03 NOTE — ED Notes (Signed)
 ED Provider at bedside.

## 2023-08-03 NOTE — ED Notes (Addendum)
 Hospitalist at bedside

## 2023-08-03 NOTE — ED Triage Notes (Signed)
Pt to ED from home [facility] for mechanical fall/trip last night while walking with walker, fell into wall and hit R head and whole R side, then walker also fell on her.  No LOC and was assisted to floor by family. Denies back, neck pain. But "everything hurts".  Also both LE are swollen--both very swollen, states like this since at least 2 years and not on diuretics.

## 2023-08-03 NOTE — Assessment & Plan Note (Signed)
Long term problem. Patient admits to burning pain in feet and distal LE  Plan Continue gabapentin to 100 mg AM, noon and 300 mg qHS

## 2023-08-03 NOTE — ED Provider Notes (Signed)
-----------------------------------------   3:07 PM on 08/03/2023 -----------------------------------------  Blood pressure (!) 230/81, pulse 82, temperature 98.6 F (37 C), temperature source Oral, resp. rate 20, height 5\' 3"  (1.6 m), weight 117 kg, SpO2 95%.  Assuming care from Catha Gosselin, NP.  In short, Alexis Farmer is a 83 y.o. female with a chief complaint of Fall, Headache, and Generalized Body Aches .  Refer to the original H&P for additional details.  The current plan of care is to await images and urinalysis.  ----------------------------------------- 5:38 PM on 08/03/2023 -----------------------------------------  Updated patient on image results. Pending urinalysis. Will administer home BP medications for BP remains at 230/81  ----------------------------------------- 9:29 PM on 08/03/2023 -----------------------------------------  Blood pressure (!) 197/59, pulse 73, temperature 99.8 F (37.7 C), temperature source Oral, resp. rate 18, height 5\' 3"  (1.6 m), weight 117 kg, SpO2 94%.  Urinalysis reveals trace of leukocytes, WBC and bacteria indicating urinary tract infection.  Obtained basic labs and patients hemoglobin is 10.7, glucose is 463, creatinine 1.40 and GFR 37 is consistent with her baseline.  Patient received morphine.  Pain level still remains high at 8 out of 10. I do feel patients disposition is appropriate for admission.  The current plan of care is to transfer care to the hospitalist team who has agreed to admit the patient.        Romeo Apple, Ardis Fullwood A, PA-C 08/03/23 2133    Janith Lima, MD 08/04/23 [phone]

## 2023-08-03 NOTE — ED Triage Notes (Signed)
Arrives via [facility] from home. Fall last night while trying to go to the [place].  Walks with a walker, family assisted patient to floor at that time.  Initially c/o all over pain over night and right side of forehead hurting. No LOC. No neck or back pain.

## 2023-08-03 NOTE — ED Notes (Signed)
Lavender and light green top tube sent to lab.

## 2023-08-03 NOTE — Assessment & Plan Note (Signed)
Multi-factorial - see above problems  Plan Change APAP to 750 AC HS  Tramadol 100 mg q8 prn

## 2023-08-03 NOTE — Assessment & Plan Note (Signed)
Mild UTI based on U/A with small LE, rare bacteria, >50 WBC  Plan Cipro

## 2023-08-03 NOTE — ED Notes (Signed)
Pt updated on plan of care, denies needs at this time, call light within reach. Family member left bedside.

## 2023-08-03 NOTE — Assessment & Plan Note (Signed)
Patient with abdominal tenderness on exam, worst in LLQ with mild guarding no rebound. BS hypoactive. Mildly febrile at 99.8, more significant in elderly immunocompromised patient. No leukocytosis. Concern for ileus, diverticulitis.  Plan KUB  If KUB not normal or inconclusive CT abdomen  Cipro, Flagyl

## 2023-08-03 NOTE — ED Notes (Signed)
 IV team at bedside

## 2023-08-03 NOTE — Assessment & Plan Note (Signed)
Per record review patient with frequent issues of uncontrolled HTN. Pain, UTI may be exacerbating feature.  Plan Continue home regimen

## 2023-08-03 NOTE — Subjective & Objective (Addendum)
Alexis Farmer is a 83 y.o. female with a past medical history of monoclonal B-cell lymphoma, stage III CKD, type 2 diabetes, peripheral neuropathy,  hypertension, heart failure with preserved EF , chronic pain, obesity, immobility. She was recently hospitalized for RSV pneumonia early Feb and has remained SOB. HH nursing recently noted out of control hypertension.  Patient had a mechanical fall Friday evening when up to bathroom. The morning of 08/03/23 she had increased diffuse pain, abdominal pain and persistent SOB. She presents to [facility] for evaluation.

## 2023-08-03 NOTE — Assessment & Plan Note (Signed)
Patient with SOB, decreased BS bibasilar R>L, no frank rales. Marked peripheral edema.  Plan D-dimer - r/o PE with SOB, immobility, left calve tenderness  BNP - r/o exacerbation of HFpEF  Continue home medications with increase of torsemide to 20 mg daily - adjust as indicated by lab results

## 2023-08-03 NOTE — ED Notes (Addendum)
Pt c/o worsening head and left shoulder pain and requesting more pain medication. Family member at bedside requesting update. Romeo Apple, PA-C notified.

## 2023-08-03 NOTE — ED Provider Notes (Signed)
[facility] Provider Note    Event Date/Time   First MD Initiated Contact with Patient 08/03/23 1409     (approximate)   History   Fall, Headache, and Generalized Body Aches   HPI  Alexis Farmer is a 83 y.o. female with history of type 2 diabetes, stage III CKD, diastolic CHF, hypertension and as listed in EMR presents to the emergency department for treatment and evaluation after a mechanical, nonsyncopal fall last night.  She fell into the door frame and hit the right side of her head. She also has pain in the left shoulder. Family was able to assist her to the floor then her walker fell on her. It hit her left lower leg as well.   Additionally, she reports that she has urinary frequency and is unable to get to the bathroom in time, so she has urinated on herself. She denies dysuria.     Physical Exam   Triage Vital Signs: ED Triage Vitals  Encounter Vitals Group     BP 08/03/23 1316 (!) 230/81     Systolic BP Percentile --      Diastolic BP Percentile --      Pulse Rate 08/03/23 1316 82     Resp 08/03/23 1316 20     Temp 08/03/23 1316 98.6 F (37 C)     Temp Source 08/03/23 1316 Oral     SpO2 08/03/23 1316 95 %     Weight 08/03/23 1316 257 lb 15 oz (117 kg)     Height 08/03/23 1316 5\' 3"  (1.6 m)     Head Circumference --      Peak Flow --      Pain Score 08/03/23 1314 10     Pain Loc --      Pain Education --      Exclude from Growth Chart --     Most recent vital signs: Vitals:   08/03/23 1316  BP: (!) 230/81  Pulse: 82  Resp: 20  Temp: 98.6 F (37 C)  SpO2: 95%    General: Awake, no distress.  CV:  Good peripheral perfusion.  Resp:  Normal effort.  Abd:  No distention.  Other:  Bilateral LE pitting edema, left greater than right with contusion over the pretibial surface.   ED Results / Procedures / Treatments   Labs (all labs ordered are listed, but only abnormal results are displayed) Labs Reviewed  URINALYSIS,  ROUTINE W REFLEX MICROSCOPIC     EKG    RADIOLOGY  Image and radiology report reviewed and interpreted by me. Radiology report consistent with the same.  Pending  PROCEDURES:  Critical Care performed: No  Procedures   MEDICATIONS ORDERED IN ED:  Medications  traMADol (ULTRAM) tablet 50 mg (50 mg Oral Given 08/03/23 1434)     IMPRESSION / MDM / ASSESSMENT AND PLAN / ED COURSE   I have reviewed the triage note.  Differential diagnosis includes, but is not limited to, intracranial injury, orbital fracture, shoulder contusion, shoulder strain, musculoskeletal pain  Patient's presentation is most consistent with acute illness / injury with system symptoms.  83 year old female presenting to the emergency department for treatment and evaluation after mechanical, nonsyncopal fall last night.  No loss of consciousness.  See HPI for further details.  Plan will be to get CT of the head and cervical spine as well as imaging of the left shoulder.  Urinalysis also requested.  No indication for labs at this time.  Care  relinquished to [facility], PA-C who will follow up on results and determine disposition.       FINAL CLINICAL IMPRESSION(S) / ED DIAGNOSES   Final diagnoses:  Minor head injury, initial encounter  Contusion of multiple sites of left lower extremity, initial encounter     Rx / DC Orders   ED Discharge Orders     None        Note:  This document was prepared using Dragon voice recognition software and may include unintentional dictation errors.   Chinita Pester, FNP 08/03/23 1512    Jene Every, MD 08/05/23 [phone]

## 2023-08-04 ENCOUNTER — Inpatient Hospital Stay: Payer: Medicare Other

## 2023-08-04 DIAGNOSIS — E669 Obesity, unspecified: Secondary | ICD-10-CM | POA: Insufficient documentation

## 2023-08-04 DIAGNOSIS — N3000 Acute cystitis without hematuria: Secondary | ICD-10-CM

## 2023-08-04 DIAGNOSIS — S0990XA Unspecified injury of head, initial encounter: Secondary | ICD-10-CM | POA: Diagnosis not present

## 2023-08-04 LAB — BASIC METABOLIC PANEL
Anion gap: 10 (ref 5–15)
BUN: 20 mg/dL (ref 8–23)
CO2: 24 mmol/L (ref 22–32)
Calcium: 8.8 mg/dL — ABNORMAL LOW (ref 8.9–10.3)
Chloride: 103 mmol/L (ref 98–111)
Creatinine, Ser: 1.48 mg/dL — ABNORMAL HIGH (ref 0.44–1.00)
GFR, Estimated: 35 mL/min — ABNORMAL LOW (ref >60)
Glucose, Bld: 322 mg/dL — ABNORMAL HIGH (ref 70–99)
Potassium: 3.7 mmol/L (ref 3.5–5.1)
Sodium: 137 mmol/L (ref 135–145)

## 2023-08-04 LAB — CBG MONITORING, ED
Glucose-Capillary: 391 mg/dL — ABNORMAL HIGH (ref 70–99)
Glucose-Capillary: 279 mg/dL — ABNORMAL HIGH (ref 70–99)
Glucose-Capillary: 203 mg/dL — ABNORMAL HIGH (ref 70–99)
Glucose-Capillary: 90 mg/dL (ref 70–99)
Glucose-Capillary: 102 mg/dL — ABNORMAL HIGH (ref 70–99)

## 2023-08-04 LAB — BRAIN NATRIURETIC PEPTIDE: B Natriuretic Peptide: 735.5 pg/mL — ABNORMAL HIGH (ref 0.0–100.0)

## 2023-08-04 LAB — D-DIMER, QUANTITATIVE: D-Dimer, Quant: 2.1 ug{FEU}/mL — ABNORMAL HIGH (ref 0.00–0.50)

## 2023-08-04 MED ORDER — CEFADROXIL 500 MG PO CAPS
500.0000 mg | ORAL_CAPSULE | Freq: Two times a day (BID) | ORAL | Status: DC
  Administered 2023-08-04: 500 mg via ORAL
  Filled 2023-08-04: qty 1

## 2023-08-04 MED ORDER — INSULIN ASPART PROT & ASPART (70-30 MIX) 100 UNIT/ML ~~LOC~~ SUSP
15.0000 [IU] | Freq: Two times a day (BID) | SUBCUTANEOUS | Status: DC
  Filled 2023-08-04: qty 10

## 2023-08-04 MED ORDER — CEFADROXIL 500 MG PO CAPS: 500.0000 mg | ORAL_CAPSULE | Freq: Two times a day (BID) | ORAL | 0 refills | Status: AC

## 2023-08-04 NOTE — ED Notes (Addendum)
Chart correction.

## 2023-08-04 NOTE — ED Notes (Signed)
Dr Ashok Pall notified and aware of pts BP of 181/68.

## 2023-08-04 NOTE — Discharge Summary (Signed)
Alexis Farmer WGN:[id number] DOB: 05/23/1941 DOA: 08/03/2023  PCP: [name], MD  Admit date: 08/03/2023 Discharge date: 08/04/2023  Time spent: 35 minutes  Recommendations for Outpatient Follow-up:  Close pcp f/u     Discharge Diagnoses:  Active Problems:   UTI (urinary tract infection)   Diffuse pain   Abdominal pain   Diabetic polyneuropathy (HCC)   Type 2 diabetes mellitus with other diabetic kidney complication (HCC)   Hypertension associated with diabetes (HCC)   Chronic diastolic CHF (congestive heart failure) (HCC)   PAD (peripheral artery disease) (HCC)   Stage 3b chronic kidney disease (HCC)   Cellulitis   Abdominal pain, LLQ   Obesity (BMI 30-39.9)   Discharge Condition: stable  Diet recommendation: heart healthy carb modified  Filed Weights   08/03/23 1316  Weight: 117 kg    History of present illness:  From admission h and p Alexis Farmer is a 83 y.o. female with a past medical history of monoclonal B-cell lymphoma, stage III CKD, type 2 diabetes, peripheral neuropathy,  hypertension, heart failure with preserved EF , chronic pain, obesity, immobility. She was recently hospitalized for RSV pneumonia Farmer Feb and has remained SOB. HH nursing recently noted out of control hypertension.   Patient had a mechanical fall Friday evening when up to bathroom. The morning of 08/03/23 she had increased diffuse pain, abdominal pain and persistent SOB .  Hospital Course:  Patient was hospitalized 2 weeks ago with rsv. She presents after a fall at home with bilateral leg pain. Here imaging of legs and head/neck negative for fracture. She also complains of dysuria and urinalysis is suggestive of infection, will be treated for uti. Urine culture not ordered on admission so discharging provider will attempt to add that on. Has lower extremity swelling but this appears to be chronic, bnp is elevated, has recent tte with diastolic dysfunction, cxr here clear and no  hypoxia. Pvl here negative for DVT. PT evaluated patient and advised skilled nursing but patient adamantly declines this. HH PT is scheduled to start tomorrow (ordered at recent hospital admission). Initial BPs elevated but they normalized with resumption of home meds. A1c is in the 9s, will need close pcp f/u for ongoing BP and glucose mgmt.   Procedures: none   Consultations: none  Discharge Exam: Vitals:   08/04/23 1330 08/04/23 1430  BP: (!) 165/61 (!) 134/49  Pulse: (!) 54 (!) 51  Resp: 18 18  Temp:    SpO2: 95% 92%    General: NAD Cardiovascular: RRR Respiratory: CTAB  Discharge Instructions   Discharge Instructions     Diet - low sodium heart healthy   Complete by: As directed    Increase activity slowly   Complete by: As directed       Allergies as of 08/04/2023       Reactions   Celexa [citalopram]    Diarrhea upset stomach   Jardiance [empagliflozin] Other (See Comments)   Reaction not recalled   Norvasc [amlodipine]    Leg edema   Tape Other (See Comments)   Leaves the skin "raw" if left on for a period of time- tolerates paper tape   Penicillin V Rash   Penicillin V Potassium Rash   Penicillins Rash   Has patient had a PCN reaction causing immediate rash, facial/tongue/throat swelling, SOB or lightheadedness with hypotension: Yes Has patient had a PCN reaction causing severe rash involving mucus membranes or skin necrosis: No Has patient had a PCN reaction that required hospitalization No  Has patient had a PCN reaction occurring within the last 10 years: Yes If all of the above answers are "NO", then may proceed with Cephalosporin use.        Medication List     TAKE these medications    Accu-Chek Guide test strip Generic drug: glucose blood USE AS INSTRUCTED 3 TIMES DAILY   acetaminophen 500 MG tablet Commonly known as: TYLENOL Take 500 mg by mouth every 6 (six) hours as needed for mild pain or headache.   aspirin EC 81 MG tablet Take  81 mg by mouth 2 (two) times a week. Swallow whole.   BD Insulin Syringe U/F 30G X 1/2" 0.5 ML Misc Generic drug: Insulin Syringe-Needle U-100 USE UP TO 5X PER DAY WITH INSULIN 2X (NPH) AND 3X (REGULAR)   benzonatate 200 MG capsule Commonly known as: TESSALON Take 200 mg by mouth 3 (three) times daily as needed for cough.   blood glucose meter kit and supplies Kit Accu chek, Dx code E11.65, check 3 times daily   Blood Pressure Kit 1 Device by Does not apply route daily.   budesonide 0.5 MG/2ML nebulizer solution Commonly known as: PULMICORT Take 0.5 mg by nebulization daily.   cefadroxil 500 MG capsule Commonly known as: DURICEF Take 1 capsule (500 mg total) by mouth 2 (two) times daily for 5 days.   Centrum Silver 50+Women Tabs Take 1 tablet by mouth daily with breakfast.   cloNIDine 0.1 MG tablet Commonly known as: CATAPRES Take 0.1 mg by mouth every 8 (eight) hours.   gabapentin 100 MG capsule Commonly known as: NEURONTIN Take 1 capsule by mouth daily.   hydrALAZINE 100 MG tablet Commonly known as: APRESOLINE TAKE 1 TABLET (100 MG TOTAL) BY MOUTH 4 (FOUR) TIMES DAILY.   insulin regular 100 units/mL injection Commonly known as: NovoLIN R Inject 0-0.08 mLs (0-8 Units total) into the skin 3 (three) times daily before meals.   ipratropium-albuterol 0.5-2.5 (3) MG/3ML Soln Commonly known as: DUONEB Take 3 mLs by nebulization 4 (four) times daily. Take scheduled 4 times daily until wheezing and shortness of breath from RSV improves. Then take as needed.   LORazepam 1 MG tablet Commonly known as: ATIVAN Take 0.5 tablets (0.5 mg total) by mouth daily as needed for anxiety. Inform pt 0.5 dose on backorder has to cut this in 1/2 dose   MUCUS RELIEF DM PO Take by mouth. Liquid cough syrup   nebivolol 10 MG tablet Commonly known as: BYSTOLIC TAKE 1 TABLET BY MOUTH EVERY DAY   NovoLIN N 100 UNIT/ML injection Generic drug: insulin NPH Human Inject 15-20 Units into the  skin 2 (two) times daily before a meal. Inject 20 units in the morning an 15 units in the evening.   nystatin powder Commonly known as: MYCOSTATIN/NYSTOP Apply topically 2 (two) times daily. Under abdomen   polyethylene glycol powder 17 GM/SCOOP powder Commonly known as: GLYCOLAX/MIRALAX Take 17 g by mouth daily as needed for moderate constipation or severe constipation. Can take up to 2x per day prn. Mix with 8 ounces of liquid   rosuvastatin 20 MG tablet Commonly known as: CRESTOR TAKE 1 TABLET BY MOUTH EVERY DAY   Scot-Tussin Expectorant 100 MG/5ML liquid Generic drug: guaiFENesin Take 20 mLs by mouth 3 (three) times daily as needed for cough.   spironolactone 25 MG tablet Commonly known as: ALDACTONE Take 25 mg by mouth 3 (three) times a week. Monday Wednesday and friday   torsemide 10 MG tablet Commonly known as:  DEMADEX Take 1 tablet (10 mg total) by mouth daily.   valsartan 160 MG tablet Commonly known as: DIOVAN TAKE 1 TABLET BY MOUTH EVERY DAY   VITAMIN C PO Take 1 tablet by mouth daily.       Allergies  Allergen Reactions   Celexa [Citalopram]     Diarrhea upset stomach    Jardiance [Empagliflozin] Other (See Comments)    Reaction not recalled   Norvasc [Amlodipine]     Leg edema   Tape Other (See Comments)    Leaves the skin "raw" if left on for a period of time- tolerates paper tape   Penicillin V Rash   Penicillin V Potassium Rash   Penicillins Rash    Has patient had a PCN reaction causing immediate rash, facial/tongue/throat swelling, SOB or lightheadedness with hypotension: Yes Has patient had a PCN reaction causing severe rash involving mucus membranes or skin necrosis: No Has patient had a PCN reaction that required hospitalization No Has patient had a PCN reaction occurring within the last 10 years: Yes If all of the above answers are "NO", then may proceed with Cephalosporin use.     Follow-up Information     [name], MD Follow up.    Specialty: Family Medicine Contact information: [address] [place] [address] [phone]                  The results of significant diagnostics from this hospitalization (including imaging, microbiology, ancillary and laboratory) are listed below for reference.    Significant Diagnostic Studies: DG Chest Port 1 View Result Date: 08/04/2023 CLINICAL DATA:  Elevated D-dimer level. History of lymphoma. Recent fall. Abdominal pain and persistent shortness of breath. EXAM: PORTABLE CHEST 1 VIEW COMPARISON:  04/01/2023 FINDINGS: Mild to moderate enlargement of the cardiopericardial silhouette. Atherosclerotic calcification of the aortic arch. Thoracic spondylosis. Right greater than left degenerative glenohumeral arthropathy. The lungs appear clear. No significant blunting of the costophrenic angles. IMPRESSION: 1. Mild to moderate enlargement of the cardiopericardial silhouette. 2. Aortic Atherosclerosis (ICD10-I70.0). Electronically Signed   By: Gaylyn Rong M.D.   On: 08/04/2023 14:33   DG Knee 1-2 Views Right Result Date: 08/04/2023 CLINICAL DATA:  Status post fall.  Soreness and right knee. EXAM: RIGHT KNEE - 1-2 VIEW COMPARISON:  None Available. FINDINGS: No signs of acute fracture or dislocation. No significant joint effusion. 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 Remote healed fracture involving the proximal shaft of the fibula. Diffuse soft tissue edema noted. IMPRESSION: 1. No acute findings. 2. Tricompartment osteoarthritis. 3. Diffuse soft tissue edema. Electronically Signed   By: Signa Kell M.D.   On: 08/04/2023 11:27   US Venous Img Lower Bilateral (DVT) Result Date: 08/04/2023 CLINICAL DATA:  Chronic bilateral lower extremities swelling. Cellulitis. EXAM: BILATERAL LOWER EXTREMITY VENOUS DOPPLER ULTRASOUND TECHNIQUE: Gray-scale sonography with compression, as well as color and duplex  ultrasound, were performed to evaluate the deep venous system(s) from the level of the common femoral vein through the popliteal and proximal calf veins. COMPARISON:  None Available. FINDINGS: VENOUS Normal compressibility of the common femoral, superficial femoral, and popliteal veins, as well as the visualized calf veins. Visualized portions of profunda femoral vein and great saphenous vein unremarkable. No filling defects to suggest DVT on grayscale or color Doppler imaging. Doppler waveforms show normal direction of venous flow, normal respiratory plasticity and response to augmentation. OTHER None. Limitations: Patient body habitus and patient tolerance. IMPRESSION: *No evidence  of acute deep vein thrombosis in the bilateral lower extremity venous system. Electronically Signed   By: Jules Schick M.D.   On: 08/04/2023 09:57   DG Abd 1 View Result Date: 08/03/2023 CLINICAL DATA:  [id number] Abdominal pain, LLQ [id number] EXAM: ABDOMEN - 1 VIEW COMPARISON:  X-ray abdomen 08/14/2021, CT abdomen pelvis 10/22/2022 FINDINGS: The bowel gas pattern is normal. No radio-opaque calculi or other significant radiographic abnormality are seen. Vascular calcifications. IMPRESSION: Nonobstructive bowel gas pattern. Electronically Signed   By: Tish Frederickson M.D.   On: 08/03/2023 23:53   CT Head Wo Contrast Result Date: 08/03/2023 CLINICAL DATA:  Neck trauma (Age >= 65y); Headache, post traumatic EXAM: CT HEAD WITHOUT CONTRAST CT CERVICAL SPINE WITHOUT CONTRAST TECHNIQUE: Multidetector CT imaging of the head and cervical spine was performed following the standard protocol without intravenous contrast. Multiplanar CT image reconstructions of the cervical spine were also generated. RADIATION DOSE REDUCTION: This exam was performed according to the departmental dose-optimization program which includes automated exposure control, adjustment of the mA and/or kV according to patient size and/or use of iterative reconstruction  technique. COMPARISON:  CT scan cervical spine from 12/12/2017. FINDINGS: CT HEAD FINDINGS Brain: No evidence of acute infarction, hemorrhage, hydrocephalus, extra-axial collection or mass lesion/mass effect. There is bilateral periventricular hypodensity, which is non-specific but most likely seen in the settings of microvascular ischemic changes. Mild-to-moderate in extent. Otherwise normal appearance of brain parenchyma. Ventricles are normal. Cerebral volume is age appropriate. Vascular: No hyperdense vessel or unexpected calcification. Intracranial arteriosclerosis. Skull: Normal. Negative for fracture or focal lesion. Sinuses/Orbits: No acute finding. Mild mucoperiosteal thickening noted in the left chamber of the sphenoid sinus. No air-fluid levels. Other: Visualized mastoid air cells are unremarkable. No mastoid effusion. CT CERVICAL SPINE FINDINGS Alignment: There is reversal of cervical lordosis. This examination does not assess for ligamentous injury or stability. Skull base and vertebrae: No acute fracture. No primary bone lesion or focal pathologic process. Soft tissues and spinal canal: No prevertebral fluid or swelling. No visible canal hematoma. Disc levels: Mildly reduced C2-3 and C5-C6 intervertebral disc heights. Remaining intervertebral disc heights are maintained. Mild facet arthropathy and minimal marginal osteophyte formation. Upper chest: Negative. Other: Probably surgically absent right thyroid lobe. Correlate with surgical history. Heterogeneous left thyroid lobe, which is not well evaluated on the CT scan exam. IMPRESSION: 1. No acute intracranial abnormality. 2. No acute osseous injury or traumatic listhesis of the cervical spine. Electronically Signed   By: Jules Schick M.D.   On: 08/03/2023 15:29   CT Cervical Spine Wo Contrast Result Date: 08/03/2023 CLINICAL DATA:  Neck trauma (Age >= 65y); Headache, post traumatic EXAM: CT HEAD WITHOUT CONTRAST CT CERVICAL SPINE WITHOUT CONTRAST  TECHNIQUE: Multidetector CT imaging of the head and cervical spine was performed following the standard protocol without intravenous contrast. Multiplanar CT image reconstructions of the cervical spine were also generated. RADIATION DOSE REDUCTION: This exam was performed according to the departmental dose-optimization program which includes automated exposure control, adjustment of the mA and/or kV according to patient size and/or use of iterative reconstruction technique. COMPARISON:  CT scan cervical spine from 12/12/2017. FINDINGS: CT HEAD FINDINGS Brain: No evidence of acute infarction, hemorrhage, hydrocephalus, extra-axial collection or mass lesion/mass effect. There is bilateral periventricular hypodensity, which is non-specific but most likely seen in the settings of microvascular ischemic changes. Mild-to-moderate in extent. Otherwise normal appearance of brain parenchyma. Ventricles are normal. Cerebral volume is age appropriate. Vascular: No hyperdense vessel or unexpected calcification. Intracranial arteriosclerosis. Skull:  Normal. Negative for fracture or focal lesion. Sinuses/Orbits: No acute finding. Mild mucoperiosteal thickening noted in the left chamber of the sphenoid sinus. No air-fluid levels. Other: Visualized mastoid air cells are unremarkable. No mastoid effusion. CT CERVICAL SPINE FINDINGS Alignment: There is reversal of cervical lordosis. This examination does not assess for ligamentous injury or stability. Skull base and vertebrae: No acute fracture. No primary bone lesion or focal pathologic process. Soft tissues and spinal canal: No prevertebral fluid or swelling. No visible canal hematoma. Disc levels: Mildly reduced C2-3 and C5-C6 intervertebral disc heights. Remaining intervertebral disc heights are maintained. Mild facet arthropathy and minimal marginal osteophyte formation. Upper chest: Negative. Other: Probably surgically absent right thyroid lobe. Correlate with surgical history.  Heterogeneous left thyroid lobe, which is not well evaluated on the CT scan exam. IMPRESSION: 1. No acute intracranial abnormality. 2. No acute osseous injury or traumatic listhesis of the cervical spine. Electronically Signed   By: Jules Schick M.D.   On: 08/03/2023 15:29   DG Shoulder Left Result Date: 08/03/2023 CLINICAL DATA:  Mechanical fall.  Pain. EXAM: LEFT SHOULDER - 2+ VIEW COMPARISON:  None Available. FINDINGS: No acute fracture or dislocation. No aggressive osseous lesion. Glenohumeral and acromioclavicular joints are normal in alignment and exhibits mild-to-moderate degenerative changes. No soft tissue swelling. No radiopaque foreign bodies. IMPRESSION: No acute osseous abnormality of the left shoulder. Electronically Signed   By: Jules Schick M.D.   On: 08/03/2023 15:23   DG Tibia/Fibula Left Result Date: 08/03/2023 CLINICAL DATA:  Pain post fall. EXAM: LEFT TIBIA AND FIBULA - 2 VIEW COMPARISON:  None Available. FINDINGS: No acute fracture or dislocation. No aggressive osseous lesion. There are degenerative changes of the knee joint in the form of reduced tibio-femoral compartment joint space (medial compartment more than lateral compartment), tibial spiking and tricompartmental osteophytosis. Ankle mortise is intact. Calcaneal spur noted along the Achilles tendon attachment site. No knee effusion or focal soft tissue swelling. No radiopaque foreign bodies. IMPRESSION: *No acute osseous abnormality of the left leg. Electronically Signed   By: Jules Schick M.D.   On: 08/03/2023 15:21    Microbiology: No results found for this or any previous visit (from the past 240 hours).   Labs: Basic Metabolic Panel: Recent Labs  Lab 08/03/23 1938 08/04/23 0416  NA 136 137  K 4.3 3.7  CL 101 103  CO2 24 24  GLUCOSE 463* 322*  BUN 20 20  CREATININE 1.40* 1.48*  CALCIUM 9.1 8.8*   Liver Function Tests: No results for input(s): "AST", "ALT", "ALKPHOS", "BILITOT", "PROT", "ALBUMIN" in  the last 168 hours. No results for input(s): "LIPASE", "AMYLASE" in the last 168 hours. No results for input(s): "AMMONIA" in the last 168 hours. CBC: Recent Labs  Lab 08/03/23 1938  WBC 8.4  HGB 10.7*  HCT 34.1*  MCV 91.2  PLT 538*   Cardiac Enzymes: No results for input(s): "CKTOTAL", "CKMB", "CKMBINDEX", "TROPONINI" in the last 168 hours. BNP: BNP (last 3 results) Recent Labs    11/27/22 0740 04/01/23 0657 08/03/23 1938  BNP 137.6* 132.9* 735.5*    ProBNP (last 3 results) No results for input(s): "PROBNP" in the last 8760 hours.  CBG: Recent Labs  Lab 08/04/23 0051 08/04/23 0510 08/04/23 0754 08/04/23 1316  GLUCAP 391* 279* 203* 90       Signed:  Silvano Bilis MD.  [facility] 08/04/2023, 2:59 PM

## 2023-08-04 NOTE — Care Management Obs Status (Signed)
MEDICARE OBSERVATION STATUS NOTIFICATION   Patient Details  Name: Alexis Farmer MRN: [id number] Date of Birth: 09/25/1940   Medicare Observation Status Notification Given:  Yes    Shakea Isip E Madora Barletta, LCSW 08/04/2023, 3:55 PM

## 2023-08-04 NOTE — TOC Initial Note (Signed)
Transition of Care [facility]) - Initial/Assessment Note    Patient Details  Name: Alexis Farmer MRN: [id number] Date of Birth: December 09, 1940  Transition of Care [facility]) CM/SW Contact:    Liliana Cline, LCSW Phone Number: 08/04/2023, 4:09 PM  Clinical Narrative:                 Spoke with patient at bedside in ED for Code 44. Noted SNF rec. Patient states she does not want SNF, wants to go home and resume [facility] services. States she is active with [facility]. CSW called [facility] Rep who confirmed they are active for PT, OT, and RN. Informed Heather of DC today. Requested HH orders from MD.   Expected Discharge Plan: Home w Home Health Services Barriers to Discharge: Barriers Resolved   Patient Goals and CMS Choice Patient states their goals for this hospitalization and ongoing recovery are:: home with home health , states she's already active CMS Medicare.gov Compare Post Acute Care list provided to:: Patient Choice offered to / list presented to : Patient      Expected Discharge Plan and Services       Living arrangements for the past 2 months: Single Family Home Expected Discharge Date: 08/04/23                         HH Arranged: PT, OT, RN HH [facility]: [facility] Date HH Agency Contacted: 08/04/23   Representative spoke with at [facility] Agency: Herbert Seta  Prior Living Arrangements/Services Living arrangements for the past 2 months: Single Family Home Lives with:: Relatives Patient language and need for interpreter reviewed:: Yes Do you feel safe going back to the place where you live?: Yes      Need for Family Participation in Patient Care: Yes (Comment) Care giver support system in place?: Yes (comment)   Criminal Activity/Legal Involvement Pertinent to Current Situation/Hospitalization: No - Comment as needed  Activities of Daily Living      Permission Sought/Granted Permission sought to share information with : Facility Games developer granted to share information with : Yes, Verbal Permission Granted     Permission granted to share info w AGENCY: [facility]        Emotional Assessment       Orientation: : Oriented to Self, Oriented to Place, Oriented to  Time, Oriented to Situation Alcohol / Substance Use: Not Applicable Psych Involvement: No (comment)  Admission diagnosis:  Abdominal pain, LLQ [R10.32] Patient Active Problem List   Diagnosis Date Noted   Obesity (BMI 30-39.9) 08/04/2023   Diffuse pain 08/03/2023   Abdominal pain 08/03/2023   Abdominal pain, LLQ 08/03/2023   Cellulitis of left lower extremity 04/02/2023   Hyponatremia 04/01/2023   Acute on chronic heart failure with preserved ejection fraction (HFpEF) (HCC) 04/01/2023   Cellulitis 04/01/2023   Lower extremity weakness 04/01/2023   Inability to access health care due to transportation insecurity 03/31/2023   Mood disorder (HCC) 01/20/2023   Vision loss, bilateral 10/22/2022   Pre-syncope 10/22/2022   Ceruminosis, right 09/07/2022   DM type 2, controlled, with complication (HCC) 08/14/2022   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/2022   Hallucinations, visual 07/20/2022   Need for pneumococcal vaccination 07/20/2022   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   Stage 3b chronic kidney disease (HCC) 03/21/2022   Ileus (HCC) 08/22/2021   Lymphedema 04/19/2021   Atherosclerosis of aorta (HCC) 01/16/2021   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/16/2021   Spinal stenosis of  lumbar region 01/16/2021   Panic attack 01/15/2021   Other chronic pain 08/25/2020   Hypertension associated with diabetes (HCC) 08/25/2020   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) 05/30/2020   Retinopathy 05/30/2020   Multinodular goiter 02/04/2020   Proteinuria 12/22/2019   Physical deconditioning 08/13/2019   Abdominal aortic atherosclerosis  (HCC) 07/24/2019   Morbid obesity with BMI of 45.0-49.9, adult (HCC) 06/09/2019   Hypercalcemia 03/18/2019   Round hole, unspecified eye 02/06/2019   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 08/13/2018   Abnormal gait 03/26/2018   Arthritis 03/26/2018   Thyroid nodule 01/20/2018   UTI (urinary tract infection) 09/24/2016   Rectal hemorrhage 07/29/2016   Carotid artery stenosis 07/19/2016   PAD (peripheral artery disease) (HCC) 07/19/2016   Chronic diastolic CHF (congestive heart failure) (HCC) 05/31/2016   Chronic fatigue 11/15/2015   Depression, recurrent (HCC) 10/13/2015   Osteoarthritis 10/13/2015   Mixed hyperlipidemia 08/26/2014   Uncontrolled type 2 diabetes mellitus with hyperglycemia (HCC) 04/08/2014   Diabetic polyneuropathy (HCC) 04/08/2014   Type 2 diabetes mellitus with other diabetic kidney complication (HCC) 04/08/2014   PCP:  [name], MD Pharmacy:   [id number] - [place], [place] - [address] [place] [address] Phone: [phone] Fax: [phone]  [id number] - [place], [place] - [address] [place] [address] Phone: [phone] Fax: [phone]     Social Drivers of Health (SDOH) Social History: SDOH Screenings   Food Insecurity: No Food Insecurity (07/29/2023)  Housing: Unknown (07/29/2023)  Transportation Needs: Unmet Transportation Needs (07/30/2023)  Utilities: Not At Risk (07/29/2023)  Alcohol Screen: Low Risk  (11/29/2022)  Depression (PHQ2-9): High Risk (08/01/2023)  Financial Resource Strain: Low Risk  (11/29/2022)  Physical Activity: Inactive (11/29/2022)  Social Connections: Moderately Integrated (07/29/2023)  Stress: Stress Concern Present (11/29/2022)  Tobacco Use: Low Risk  (08/03/2023)   SDOH Interventions:     Readmission Risk Interventions     No data to display

## 2023-08-04 NOTE — ED Notes (Signed)
Changed pt brief.

## 2023-08-04 NOTE — Evaluation (Signed)
Physical Therapy Evaluation Patient Details Name: Alexis Farmer MRN: [id number] DOB: Oct 28, 1940 Today's Date: 08/04/2023  History of Present Illness  Alexis Farmer is a 82 y.o. female with a past medical history of monoclonal B-cell lymphoma, stage III CKD, type 2 diabetes, peripheral neuropathy,  hypertension, heart failure with preserved EF , chronic pain, obesity, immobility. She was recently hospitalized for RSV pneumonia early Feb and has remained SOB. HH nursing recently noted out of control hypertension  Clinical Impression  Patient received in bed, requires encouragement to participate. Wants to go home. She is somewhat irritable/frustrated throughout session. She is pain limited, requiring mod A to max A for bed mobility. Mod A to stand from elevated bed with increased time and effort. She was able to take a few side steps at edge of bed with mod +2 assist for safety. Patient will continue to benefit from skilled PT to improve strength and independence.          If plan is discharge home, recommend the following: A lot of help with walking and/or transfers;A lot of help with bathing/dressing/bathroom;Assist for transportation   Can travel by private vehicle   No    Equipment Recommendations None recommended by PT  Recommendations for Other Services       Functional Status Assessment Patient has had a recent decline in their functional status and demonstrates the ability to make significant improvements in function in a reasonable and predictable amount of time.     Precautions / Restrictions Precautions Precautions: Fall Restrictions Weight Bearing Restrictions Per Provider Order: No      Mobility  Bed Mobility Overal bed mobility: Needs Assistance Bed Mobility: Supine to Sit, Sit to Supine     Supine to sit: Mod assist Sit to supine: Max assist, +2 for physical assistance   General bed mobility comments: Patient limited by pain. Requires mod A for raising  trunk to seated position. +2 max A to return to supine and to scoot up in bed.    Transfers Overall transfer level: Needs assistance Equipment used: Rolling walker (2 wheels) Transfers: Sit to/from Stand Sit to Stand: +2 physical assistance, Mod assist, From elevated surface           General transfer comment: increased time and effort to get standing    Ambulation/Gait Ambulation/Gait assistance: Min assist   Assistive device: Rolling walker (2 wheels) Gait Pattern/deviations: Step-to pattern Gait velocity: decr     General Gait Details: able to take some lateral steps at edge of bed to get up in bed.  Stairs            Wheelchair Mobility     Tilt Bed    Modified Rankin (Stroke Patients Only)       Balance Overall balance assessment: Needs assistance, History of Falls Sitting-balance support: Feet supported Sitting balance-Leahy Scale: Fair     Standing balance support: Bilateral upper extremity supported, During functional activity, Reliant on assistive device for balance Standing balance-Leahy Scale: Fair                               Pertinent Vitals/Pain Pain Assessment Pain Assessment: Faces Faces Pain Scale: Hurts even more Pain Location: all over from fall Pain Descriptors / Indicators: Sore, Discomfort, Grimacing, Crying Pain Intervention(s): Monitored during session, Limited activity within patient's tolerance, Repositioned    Home Living Family/patient expects to be discharged to:: Private residence Living Arrangements: Children (patient reports she lives  with her daughter and granddaughter. Daughter is disabled and unable to physically help her.) Available Help at Discharge: Family;Available PRN/intermittently Type of Home: Apartment Home Access: Stairs to enter Entrance Stairs-Rails: None Entrance Stairs-Number of Steps: 1   Home Layout: One level Home Equipment: Agricultural consultant (2 wheels);Rollator (4 wheels);BSC/3in1       Prior Function Prior Level of Function : Needs assist       Physical Assist : Mobility (physical);ADLs (physical) Mobility (physical): Gait ADLs (physical): Bathing;Dressing;IADLs Mobility Comments: Uses walker at baseline, states she walks from her chair to bathroom about 10 feet. ADLs Comments: requires assistance     Extremity/Trunk Assessment   Upper Extremity Assessment Upper Extremity Assessment: Generalized weakness    Lower Extremity Assessment Lower Extremity Assessment: Generalized weakness    Cervical / Trunk Assessment Cervical / Trunk Assessment: Normal  Communication   Communication Communication: No apparent difficulties    Cognition Arousal: Alert Behavior During Therapy: Lability   PT - Cognitive impairments: No family/caregiver present to determine baseline, Problem solving, Safety/Judgement, Awareness                       PT - Cognition Comments: patient frustrated, wants to go home, feels like she is being treated poorly. Decreased awareness of limitations/safety         Cueing Cueing Techniques: Verbal cues     General Comments      Exercises     Assessment/Plan    PT Assessment Patient needs continued PT services  PT Problem List Decreased strength;Decreased activity tolerance;Decreased balance;Decreased mobility;Pain;Decreased cognition;Decreased safety awareness;Obesity       PT Treatment Interventions DME instruction;Gait training;Functional mobility training;Therapeutic activities;Therapeutic exercise;Balance training;Stair training;Cognitive remediation;Patient/family education    PT Goals (Current goals can be found in the Care Plan section)  Acute Rehab PT Goals Patient Stated Goal: to go home now PT Goal Formulation: With patient Time For Goal Achievement: 08/16/23 Potential to Achieve Goals: Poor    Frequency Min 1X/week     Co-evaluation               AM-PAC PT "6 Clicks" Mobility  Outcome  Measure Help needed turning from your back to your side while in a flat bed without using bedrails?: A Lot Help needed moving from lying on your back to sitting on the side of a flat bed without using bedrails?: A Lot Help needed moving to and from a bed to a chair (including a wheelchair)?: Total Help needed standing up from a chair using your arms (e.g., wheelchair or bedside chair)?: A Lot Help needed to walk in hospital room?: Total Help needed climbing 3-5 steps with a railing? : Total 6 Click Score: 9    End of Session Equipment Utilized During Treatment: Oxygen Activity Tolerance: Patient limited by pain;Patient limited by fatigue Patient left: in bed;with call bell/phone within reach;with bed alarm set Nurse Communication: Mobility status PT Visit Diagnosis: Unsteadiness on feet (R26.81);Other abnormalities of gait and mobility (R26.89);Muscle weakness (generalized) (M62.81);Difficulty in walking, not elsewhere classified (R26.2);Pain;History of falling (Z91.81) Pain - Right/Left:  (all over)    Time: 7829-5621 PT Time Calculation (min) (ACUTE ONLY): 30 min   Charges:   PT Evaluation $PT Eval Moderate Complexity: 1 Mod PT Treatments $Therapeutic Activity: 8-22 mins PT General Charges $$ ACUTE PT VISIT: 1 Visit         Edoardo Laforte, PT, GCS 08/04/23,1:04 PM

## 2023-08-04 NOTE — Care Management CC44 (Signed)
Condition Code 44 Documentation Completed  Patient Details  Name: Alexis Farmer MRN: [id number] Date of Birth: [birth year]   Condition Code 44 given:  Yes Patient signature on Condition Code 44 notice:  Yes Documentation of 2 MD's agreement:  Yes Code 44 added to claim:  Yes    Kani Chauvin E Trinda Harlacher, LCSW 08/04/2023, 3:55 PM

## 2023-08-05 ENCOUNTER — Telehealth: Payer: Self-pay

## 2023-08-05 NOTE — Telephone Encounter (Signed)
Faxed over office notes to [facility].

## 2023-08-05 NOTE — Telephone Encounter (Signed)
 Ok to give verbal orders?

## 2023-08-05 NOTE — Telephone Encounter (Signed)
Faxed over Office notes to [facility].

## 2023-08-05 NOTE — Telephone Encounter (Signed)
Copied from CRM [id number]. Topic: Clinical - Home Health Verbal Orders >> Aug 05, 2023 11:46 AM Theodis Sato wrote: Caller/Agency: [facility] Callback Number: [phone] Service Requested: Physical therapy  Frequency: 1 week 8 Any new concerns about the patient? Yes patient fell on Friday

## 2023-08-06 LAB — URINE CULTURE: Culture: 50000 — AB

## 2023-08-06 NOTE — Telephone Encounter (Signed)
Left verbal orders on vm 

## 2023-08-07 NOTE — Assessment & Plan Note (Signed)
Continue current medications Follow-up with endocrinology as scheduled.

## 2023-08-07 NOTE — Assessment & Plan Note (Signed)
Blood pressure recorded at 150/100 without medication. Patient reported taking medication after the reading. Currently asymptomatic -Advise patient to monitor blood pressure regularly. -Continue Clonidine 0.1 mg q8h -Continue Nebivilol 10 mg daily -Continue Aldactone 25 mg daily -Continue Torsemide 10 mg daily -Continue Valsartan 160 mg daily -Follows with Nephrology and Cardiology

## 2023-08-07 NOTE — Assessment & Plan Note (Signed)
>>  ASSESSMENT AND PLAN FOR TYPE 2 DIABETES MELLITUS WITHOUT COMPLICATIONS (HCC) WRITTEN ON 08/07/2023  5:21 PM BY WALSH, TANYA, MD  Continue current medications Follow-up with endocrinology as scheduled.

## 2023-08-08 ENCOUNTER — Encounter: Payer: Self-pay | Admitting: Family Medicine

## 2023-08-08 DIAGNOSIS — B338 Other specified viral diseases: Secondary | ICD-10-CM | POA: Insufficient documentation

## 2023-08-08 NOTE — Assessment & Plan Note (Signed)
Recently hospitalized for 4 days. Currently experiencing shortness of breath, particularly at night, and persistent cough. Oxygen saturation at 96% and using DuoNeb treatments three times a day. -Continue DuoNeb treatments as needed. -Obtain an oxygen monitor to ensure oxygen saturation remains above 92%. -If any worsening symptoms, difficulty breathing or lowering oxygen saturations patient aware to return to hospital

## 2023-08-08 NOTE — Assessment & Plan Note (Signed)
Patient's daughter reports patient has anxiety medication but is reluctant to take it due to side effects. -Advise patient to use anxiety medication as needed for anxiety attacks. -Continue Lorazepam 0.5 mg daily as needed.  No refill requested at this time. PDMP reviewed and appropriate

## 2023-08-12 ENCOUNTER — Ambulatory Visit: Payer: Medicare Other | Admitting: Podiatry

## 2023-08-12 ENCOUNTER — Telehealth: Payer: Self-pay

## 2023-08-12 NOTE — Telephone Encounter (Signed)
 Patient was identified as falling into the [place] Measure - Diabetes.   Patient was: Appointment scheduled with primary care provider in the next 30 days.   Patient was seen 08/01/23 and A1C was collected.  Patient to follow up with Endo.

## 2023-08-13 ENCOUNTER — Telehealth: Payer: Self-pay

## 2023-08-13 ENCOUNTER — Ambulatory Visit: Payer: Medicare Other | Admitting: Podiatry

## 2023-08-13 DIAGNOSIS — E1122 Type 2 diabetes mellitus with diabetic chronic kidney disease: Secondary | ICD-10-CM

## 2023-08-13 DIAGNOSIS — E1142 Type 2 diabetes mellitus with diabetic polyneuropathy: Secondary | ICD-10-CM

## 2023-08-13 DIAGNOSIS — E7800 Pure hypercholesterolemia, unspecified: Secondary | ICD-10-CM

## 2023-08-13 DIAGNOSIS — E11319 Type 2 diabetes mellitus with unspecified diabetic retinopathy without macular edema: Secondary | ICD-10-CM

## 2023-08-13 DIAGNOSIS — F339 Major depressive disorder, recurrent, unspecified: Secondary | ICD-10-CM

## 2023-08-13 DIAGNOSIS — N1832 Chronic kidney disease, stage 3b: Secondary | ICD-10-CM

## 2023-08-13 DIAGNOSIS — I6529 Occlusion and stenosis of unspecified carotid artery: Secondary | ICD-10-CM

## 2023-08-13 DIAGNOSIS — I5033 Acute on chronic diastolic (congestive) heart failure: Secondary | ICD-10-CM

## 2023-08-13 DIAGNOSIS — F419 Anxiety disorder, unspecified: Secondary | ICD-10-CM

## 2023-08-13 DIAGNOSIS — I130 Hypertensive heart and chronic kidney disease with heart failure and stage 1 through stage 4 chronic kidney disease, or unspecified chronic kidney disease: Secondary | ICD-10-CM

## 2023-08-13 DIAGNOSIS — I700 Atherosclerosis of aorta: Secondary | ICD-10-CM

## 2023-08-13 NOTE — Telephone Encounter (Signed)
 Copied from CRM [id number]. Topic: General - Other >> Aug 13, 2023  2:13 PM Denese Killings wrote: Reason for CRM: Aggie Cosier with [employer] [facility] stated that they need additional information (narratives should be added to the notes) in regards to the order for patient's hospital bed. She states that it was sent over on 02/21 and is checking the status of it.  -states can be resent

## 2023-08-14 ENCOUNTER — Other Ambulatory Visit: Payer: Self-pay | Admitting: Cardiovascular Disease

## 2023-08-14 DIAGNOSIS — R002 Palpitations: Secondary | ICD-10-CM

## 2023-08-14 DIAGNOSIS — I6523 Occlusion and stenosis of bilateral carotid arteries: Secondary | ICD-10-CM

## 2023-08-14 DIAGNOSIS — I10 Essential (primary) hypertension: Secondary | ICD-10-CM

## 2023-08-14 NOTE — Telephone Encounter (Signed)
 Received the attached fax requesting details for the Semi-electric hospital bed.  DME company needs.

## 2023-08-16 NOTE — Telephone Encounter (Signed)
 Pt scheduled for 08/22/2023 for virtual to talk about the Hospital bed and see what is needed to get it going.

## 2023-08-19 ENCOUNTER — Telehealth: Payer: Self-pay

## 2023-08-19 NOTE — Patient Instructions (Signed)
 Visit Information  Thank you for taking time to visit with me today. Please don't hesitate to contact me if I can be of assistance to you.   Following are the goals we discussed today:   Goals Addressed             This Visit's Progress    Patient Stated:  " Getting my diabetes under control and manage heart failure and chronic health conditions"       Interventions Today    Flowsheet Row Most Recent Value  Chronic Disease   Chronic disease during today's visit Diabetes, Congestive Heart Failure (CHF)  General Interventions   General Interventions Discussed/Reviewed General Interventions Reviewed, Doctor Visits  [evaluation of current treatment plan for listed health conditions and patients adherence to plan as established by provider. Assessed for any stroke like symptoms, HF symptoms and blood sugar reading.]  Doctor Visits Discussed/Reviewed Doctor Visits Reviewed  [facility] patient has post ED follow up visit with primary care provider scheduled.  Reviewed upcoming provider visits. Advised to keep follow up appointments as recommended.]  Education Interventions   Education Provided Provided Education  [Reviewed rule of 15 hypoglycemic treatment. Advised patient if experiences another episode with face numbness, one side body weakness/ stroke like symptoms to call 911 and suggest go to ED for further evaluation.]  Provided Verbal Education On Blood Sugar Monitoring  [Advised to monitor blood sugar 1-2 times daily. Advised to notify provider for frequent BS <70 or >250. Reviewed HF symptoms. Advised to notify provider for increase symptoms or call 911 for severe symptoms.]  Mental Health Interventions   Mental Health Discussed/Reviewed Mental Health Reviewed  Jorje Guild listening and support. Advised patient to write her schedule down in the morning, i.e. check blood sugar, take morning medications as prescribed, eat, etc. Advised to keep appointments on calendar]  Nutrition Interventions    Nutrition Discussed/Reviewed Nutrition Reviewed  [Discussed importance of eating at least 3 meals per day and healthy snack.  Discussed with patient that skipping meals can cause dangerous fluctuations in blood sugars.]  Pharmacy Interventions   Pharmacy Dicussed/Reviewed Pharmacy Topics Reviewed  [medication list reviewed. Advised to take medications as prescribed.]              Our next appointment is by telephone on 09/18/23 at 10 am  Please call the care guide team at [phone] if you need to cancel or reschedule your appointment.   If you are experiencing a Mental Health or Behavioral Health Crisis or need someone to talk to, please call the Suicide and Crisis Lifeline: 988 call 1-800-273-TALK (toll free, 24 hour hotline)  The patient verbalized understanding of instructions, educational materials, and care plan provided today and agreed to receive a mailed copy of patient instructions, educational materials, and care plan.   [name] RN, BSN, CCM [employer], Population Health Case Manager Phone: [phone]

## 2023-08-19 NOTE — Telephone Encounter (Signed)
 Pt schedule for 3/14 @ 9:40

## 2023-08-19 NOTE — Patient Outreach (Signed)
 Care Coordination   Follow Up Visit Note   08/19/2023 Name: Alexis Farmer MRN: [id number] DOB: 1940/08/24  Alexis Farmer is a 83 y.o. year old female who sees [name], MD for primary care. I spoke with  [name] by phone today.  What matters to the patients health and wellness today?  Returned call to patient.  Patient states she had episode this weekend of face getting numb, left arm weakness and babbling.  She states EMS was called and she was evaluated.  Patient states EMS did not think she was having a stroke however treated her for low blood sugar and had her eat something.  She states she chose not to go to the hospital because she doesn't feel they would have done anything for her there.  Patient states her fasting blood sugar this morning was 180 which is back to normal for her. Patient denies having any further episodes.   Patient states she is living with her granddaughter and her husband and they check on her frequently.     Goals Addressed             This Visit's Progress    Patient Stated:  " Getting my diabetes under control and manage heart failure and chronic health conditions"       Interventions Today    Flowsheet Row Most Recent Value  Chronic Disease   Chronic disease during today's visit Diabetes, Congestive Heart Failure (CHF)  General Interventions   General Interventions Discussed/Reviewed General Interventions Reviewed, Doctor Visits  [evaluation of current treatment plan for listed health conditions and patients adherence to plan as established by provider. Assessed for any stroke like symptoms, HF symptoms and blood sugar reading.]  Doctor Visits Discussed/Reviewed Doctor Visits Reviewed  [facility] patient has post ED follow up visit with primary care provider scheduled.  Reviewed upcoming provider visits. Advised to keep follow up appointments as recommended.]  Education Interventions   Education Provided Provided Education  [Reviewed rule of  15 hypoglycemic treatment. Advised patient if experiences another episode with face numbness, one side body weakness/ stroke like symptoms to call 911 and suggest go to ED for further evaluation.]  Provided Verbal Education On Blood Sugar Monitoring  [Advised to monitor blood sugar 1-2 times daily. Advised to notify provider for frequent BS <70 or >250. Reviewed HF symptoms. Advised to notify provider for increase symptoms or call 911 for severe symptoms.]  Mental Health Interventions   Mental Health Discussed/Reviewed Mental Health Reviewed  Jorje Guild listening and support. Advised patient to write her schedule down in the morning, i.e. check blood sugar, take morning medications as prescribed, eat, etc. Advised to keep appointments on calendar]  Nutrition Interventions   Nutrition Discussed/Reviewed Nutrition Reviewed  [Discussed importance of eating at least 3 meals per day and healthy snack.  Discussed with patient that skipping meals can cause dangerous fluctuations in blood sugars.]  Pharmacy Interventions   Pharmacy Dicussed/Reviewed Pharmacy Topics Reviewed  [medication list reviewed. Advised to take medications as prescribed.]              SDOH assessments and interventions completed:  No     Care Coordination Interventions:  Yes, provided   Follow up plan: Follow up call scheduled for 09/18/23, at 10 am    Encounter Outcome:  Patient Visit Completed   [name] RN, BSN, CCM [place]  [facility], Population Health Case Manager Phone: [phone]

## 2023-08-20 ENCOUNTER — Telehealth: Payer: Self-pay

## 2023-08-20 NOTE — Telephone Encounter (Signed)
 waCopied from CRM #[id number]. Topic: Clinical - Home Health Verbal Orders >> Aug 20, 2023 12:03 PM Isabell A wrote: Caller/Agency: Darral Dash from [facility]  Callback Number: [phone] Service Requested: Occupational Therapy Frequency: [place] Any new concerns about the patient? N/A

## 2023-08-22 ENCOUNTER — Telehealth: Payer: Medicare Other | Admitting: Family Medicine

## 2023-08-22 ENCOUNTER — Telehealth: Payer: Self-pay

## 2023-08-22 NOTE — Telephone Encounter (Signed)
 Copied from CRM [id number]. Topic: General - Other >> Aug 22, 2023  3:08 PM Desma Mcgregor wrote: Reason for CRM: Darral Dash from [facility] calling back to check the status of the verbal orders placed 08/20/23. I advised of the delay as the office is closed and it appears the doctor is out. She informed that this is of high priority for the patient. Please follow up

## 2023-08-29 ENCOUNTER — Ambulatory Visit: Payer: Self-pay | Admitting: Family Medicine

## 2023-08-29 NOTE — Telephone Encounter (Signed)
 Chief Complaint: Elevated BP reading Symptoms: None Frequency: Now Pertinent Negatives: Patient denies HA, CP, SOB Disposition: [] ED /[] Urgent Care (no appt availability in office) / [x] Appointment(In office/virtual)/ []  [place] [facility]/ [] Home Care/ [] Refused Recommended Disposition /[] [place] [facility]/ []  Follow-up with PCP Additional Notes: Aggie Cosier, nurse with [facility] calling in to report elevated BP reading of 190/80, she advises pt is asymptomatic and has hospital fu scheduled tomorrow AM, appt confirmed. Routing to provider as Lorain Childes.   Copied from CRM [id number]. Topic: Clinical - Red Word Triage >> Aug 29, 2023  4:52 PM [place] J wrote: [facility] that prompted transfer to Nurse Triage: Blood pressure of 190/80. Reason for Disposition  Systolic BP  >= 180 OR Diastolic >= 110  Answer Assessment - Initial Assessment Questions 1. BLOOD PRESSURE: "What is the blood pressure?" "Did you take at least two measurements 5 minutes apart?"     190/80 2. ONSET: "When did you take your blood pressure?"     Now 3. HOW: "How did you take your blood pressure?" (e.g., automatic home BP monitor, visiting nurse)     manual 4. HISTORY: "Do you have a history of high blood pressure?"     Yes  6. OTHER SYMPTOMS: "Do you have any symptoms?" (e.g., blurred vision, chest pain, difficulty breathing, headache, weakness)     None  Protocols used: Blood Pressure - High-A-AH

## 2023-08-30 ENCOUNTER — Ambulatory Visit (INDEPENDENT_AMBULATORY_CARE_PROVIDER_SITE_OTHER): Admitting: Family Medicine

## 2023-08-30 ENCOUNTER — Encounter: Payer: Self-pay | Admitting: Family Medicine

## 2023-08-30 ENCOUNTER — Other Ambulatory Visit: Payer: Self-pay

## 2023-08-30 VITALS — BP 170/70 | HR 65 | Temp 98.5°F | Resp 21 | Ht 63.0 in

## 2023-08-30 DIAGNOSIS — E1159 Type 2 diabetes mellitus with other circulatory complications: Secondary | ICD-10-CM | POA: Diagnosis not present

## 2023-08-30 DIAGNOSIS — I152 Hypertension secondary to endocrine disorders: Secondary | ICD-10-CM

## 2023-08-30 DIAGNOSIS — H6123 Impacted cerumen, bilateral: Secondary | ICD-10-CM | POA: Diagnosis not present

## 2023-08-30 DIAGNOSIS — R29898 Other symptoms and signs involving the musculoskeletal system: Secondary | ICD-10-CM

## 2023-08-30 DIAGNOSIS — I5032 Chronic diastolic (congestive) heart failure: Secondary | ICD-10-CM

## 2023-08-30 DIAGNOSIS — Z794 Long term (current) use of insulin: Secondary | ICD-10-CM

## 2023-08-30 DIAGNOSIS — E1142 Type 2 diabetes mellitus with diabetic polyneuropathy: Secondary | ICD-10-CM

## 2023-08-30 MED ORDER — CLONIDINE HCL 0.1 MG PO TABS: 0.1000 mg | ORAL_TABLET | Freq: Two times a day (BID) | ORAL | 6 refills | Status: DC

## 2023-08-30 NOTE — Telephone Encounter (Signed)
 Pt has an appointment 08/30/2023 at 9:40 am

## 2023-08-30 NOTE — Patient Instructions (Addendum)
 It was a pleasure meeting you today. Thank you for allowing me to take part in your health care.  Our goals for today as we discussed include:  Please send notes from OT and PT and will send in order for hospital bed  Let me know about the Audiology referral  Debrox 5 drops to left ear two times a day for 5 days  Follow up with Endocrinology  Will check with [facility] in [place] and transportation if can go there  Can also check for home visits with MD or NP service  Right ear wax removed today.  If any issues please notify MD  This is a list of the screening recommended for you and due dates:  Health Maintenance  Topic Date Due   DTaP/Tdap/Td vaccine (1 - Tdap) Never done   Yearly kidney health urinalysis for diabetes  04/19/2022   Complete foot exam   11/25/2022   COVID-19 Vaccine (5 - 2024-25 season) 02/17/2023   Flu Shot  09/16/2023*   Medicare Annual Wellness Visit  11/29/2023   Hemoglobin A1C  01/29/2024   Eye exam for diabetics  03/04/2024   Yearly kidney function blood test for diabetes  08/03/2024   Pneumonia Vaccine  Completed   Zoster (Shingles) Vaccine  Completed   HPV Vaccine  Aged Out   DEXA scan (bone density measurement)  Discontinued  *Topic was postponed. The date shown is not the original due date.      If you have any questions or concerns, please do not hesitate to call the office at [phone].  I look forward to our next visit and until then take care and stay safe.  Regards,   [name], MD   [facility]

## 2023-08-30 NOTE — Progress Notes (Signed)
 SUBJECTIVE:   Chief Complaint  Patient presents with   Hypertension   dme orders    Diabetic shoes and hospital bed   HPI Presents to clinic with grand daughter for follow up chronic disease management  Discussed the use of AI scribe software for clinical note transcription with the patient, who gave verbal consent to proceed.  History of Present Illness Alexis Farmer is an 83 year old female with congestive heart failure who presents with difficulty sleeping in a regular bed due to mobility issues and the need for elevation.  She experiences difficulty sleeping in a regular bed due to mobility issues and the need for elevation related to her congestive heart failure. Her current bed is too high, making it difficult for her to get in and out without assistance, leading her to sleep in a recliner chair. She requires assistance from one person to stand and uses a walker to move to the chair. She cannot lie flat due to her congestive heart failure, which necessitates elevation to prevent shortness of breath.  She has a history of peripheral artery disease and diabetic neuropathy, which contribute to poor circulation and necessitate the use of diabetic shoes. She has been fitted for diabetic shoes, and her blood pressure was recorded as 134/62 by a nurse recently. She takes her blood pressure medication regularly but does not check her blood pressure at home. She experiences anxiety when visiting the doctor, which may affect her readings.  She is currently receiving physical therapy and occupational therapy services. Occupational therapy is scheduled for two more visits next week, while physical therapy is expected to resume next week after a break.  She reports eating adequately but mentions needing dental work to improve her ability to eat.      PERTINENT PMH / PSH: As above  OBJECTIVE:  BP (!) 170/70   Pulse 65   Temp 98.5 F (36.9 C)   Resp (!) 21   Ht 5\' 3"  (1.6 m)   SpO2  98%   BMI 45.69 kg/m    Physical Exam Vitals reviewed.  Constitutional:      General: She is not in acute distress.    Appearance: Normal appearance. She is normal weight. She is not ill-appearing, toxic-appearing or diaphoretic.  Eyes:     General:        Right eye: No discharge.        Left eye: No discharge.     Conjunctiva/sclera: Conjunctivae normal.  Cardiovascular:     Rate and Rhythm: Normal rate and regular rhythm.     Heart sounds: Normal heart sounds.  Pulmonary:     Effort: Pulmonary effort is normal.     Breath sounds: Normal breath sounds.  Abdominal:     General: Bowel sounds are normal.  Musculoskeletal:     Right lower leg: Edema present.     Left lower leg: Edema present.     Comments: Wheelchair bound, unable to perform MSK assessment  Skin:    General: Skin is warm and dry.  Neurological:     General: No focal deficit present.     Mental Status: She is alert and oriented to person, place, and time. Mental status is at baseline.  Psychiatric:        Mood and Affect: Mood normal.        Behavior: Behavior normal.        Thought Content: Thought content normal.        Judgment: Judgment normal.  08/01/2023   10:26 AM 03/26/2023   11:43 AM 01/08/2023   10:13 AM 11/29/2022   10:37 AM 09/04/2022   11:27 AM  Depression screen PHQ 2/9  Decreased Interest 1 2 0 0 0  Down, Depressed, Hopeless 1 2 0 0 0  PHQ - 2 Score 2 4 0 0 0  Altered sleeping 3 3     Tired, decreased energy 3 3     Change in appetite 1 3     Feeling bad or failure about yourself  1 3     Trouble concentrating 0 0     Moving slowly or fidgety/restless 3 3     Suicidal thoughts 0 0     PHQ-9 Score 13 19     Difficult doing work/chores Very difficult Extremely dIfficult         09/04/2022   11:27 AM 12/29/2020   11:00 AM 08/13/2019   10:22 AM 11/15/2015   10:28 AM  GAD 7 : Generalized Anxiety Score  Nervous, Anxious, on Edge 0 3 0 0  Control/stop worrying 0 3 0 1  Worry  too much - different things 0 3 0 1  Trouble relaxing 0 0 0 1  Restless 0 0 0 0  Easily annoyed or irritable 0 0 0 1  Afraid - awful might happen 0 0 0 0  Total GAD 7 Score 0 9 0 4  Anxiety Difficulty Not difficult at all Not difficult at all Not difficult at all Somewhat difficult    ASSESSMENT/PLAN:  Chronic diastolic CHF (congestive heart failure) (HCC) Assessment & Plan: Elevation needed when lying down to prevent dyspnea, supporting hospital bed request. - Include need for elevation due to heart failure in hospital bed documentation.   Weakness of both lower extremities Assessment & Plan: Has now been sleeping in recliner as she has been unable to transfer to bed with assistance due to height of bed and unable to lay flat due to her CHF and increasing shortness of breath. - Contact PT and OT for hospital bed recommendations. - Ensure PT and OT documents are sent to provider.   Hypertension associated with diabetes (HCC) Assessment & Plan: BP 134/62 mmHg. Elevated readings attributed to anxiety. No medication changes planned. - Monitor BP during visits, consider home monitoring if necessary.   Diabetic polyneuropathy associated with type 2 diabetes mellitus (HCC) Assessment & Plan: Conditions contribute to poor circulation and decreased sensation, necessitating diabetic shoes. -Forms completed for diabetic shoes and returned to patient   Bilateral impacted cerumen Assessment & Plan: Ceruminosis is noted.  Verbal consent given for  removal of cerumen.  Wax is removed by manual debridement in right ear, unable to tolerate removal from left ear. Instructions for home care to prevent wax buildup are given.  Recommend Debrox 5 drops BID x 5 days to left ear, if no improvement can schedule nurse visit for irrigation     PDMP reviewed  Return if symptoms worsen or fail to improve, for PCP.  [name], MD

## 2023-09-02 ENCOUNTER — Telehealth: Payer: Self-pay

## 2023-09-02 NOTE — Telephone Encounter (Signed)
 Copied from CRM [id number]. Topic: Clinical - Medical Advice >> Sep 02, 2023 10:28 AM Elizebeth Brooking wrote: Reason for CRM: Onalee Hua, [facility] health nurse called in stated patient blood sugar was 300 and blood pressure was very high, I offer for them to be triaged to the nurses. Onalee Hua decline and stated she has just took her insulin and blood pressure medicine so it will come down, just wanted to notified pcp

## 2023-09-03 ENCOUNTER — Other Ambulatory Visit: Payer: Self-pay | Admitting: Family Medicine

## 2023-09-03 DIAGNOSIS — F39 Unspecified mood [affective] disorder: Secondary | ICD-10-CM

## 2023-09-03 DIAGNOSIS — F410 Panic disorder [episodic paroxysmal anxiety] without agoraphobia: Secondary | ICD-10-CM

## 2023-09-03 MED ORDER — LORAZEPAM 1 MG PO TABS: 0.5000 mg | ORAL_TABLET | Freq: Every day | ORAL | 0 refills | Status: DC | PRN

## 2023-09-03 NOTE — Telephone Encounter (Signed)
 Copied from CRM [id number]. Topic: Clinical - Medication Refill >> Sep 03, 2023 10:43 AM Elizebeth Brooking wrote: Most Recent Primary Care Visit:  Provider: [name]  Department: [facility]  Visit Type: OFFICE VISIT  Date: 08/30/2023  Medication: LORazepam (ATIVAN) 1 MG tablet  Has the patient contacted their pharmacy? Yes (Agent: If no, request that the patient contact the pharmacy for the refill. If patient does not wish to contact the pharmacy document the reason why and proceed with request.) (Agent: If yes, when and what did the pharmacy advise?)  Is this the correct pharmacy for this prescription? Yes If no, delete pharmacy and type the correct one.  This is the patient's preferred pharmacy:  [facility] [phone]- . [address] [place], [place] [address]  Has the prescription been filled recently? No  Is the patient out of the medication? Yes  Has the patient been seen for an appointment in the last year OR does the patient have an upcoming appointment? Yes  Can we respond through MyChart? Yes  Agent: Please be advised that Rx refills may take up to 3 business days. We ask that you follow-up with your pharmacy.

## 2023-09-03 NOTE — Telephone Encounter (Signed)
 Needs a verbal order. Ok to give?

## 2023-09-03 NOTE — Telephone Encounter (Signed)
 Sending note to Endo provider

## 2023-09-04 ENCOUNTER — Encounter: Payer: Self-pay | Admitting: Family Medicine

## 2023-09-04 NOTE — Assessment & Plan Note (Signed)
 Has now been sleeping in recliner as she has been unable to transfer to bed with assistance due to height of bed and unable to lay flat due to her CHF and increasing shortness of breath. - Contact PT and OT for hospital bed recommendations. - Ensure PT and OT documents are sent to provider.

## 2023-09-04 NOTE — Assessment & Plan Note (Signed)
 Ceruminosis is noted.  Verbal consent given for  removal of cerumen.  Wax is removed by manual debridement in right ear, unable to tolerate removal from left ear. Instructions for home care to prevent wax buildup are given.  Recommend Debrox 5 drops BID x 5 days to left ear, if no improvement can schedule nurse visit for irrigation

## 2023-09-04 NOTE — Telephone Encounter (Signed)
 Left message to return call to our office.

## 2023-09-04 NOTE — Assessment & Plan Note (Signed)
 BP 134/62 mmHg. Elevated readings attributed to anxiety. No medication changes planned. - Monitor BP during visits, consider home monitoring if necessary.

## 2023-09-04 NOTE — Assessment & Plan Note (Signed)
 Elevation needed when lying down to prevent dyspnea, supporting hospital bed request. - Include need for elevation due to heart failure in hospital bed documentation.

## 2023-09-04 NOTE — Assessment & Plan Note (Signed)
 Conditions contribute to poor circulation and decreased sensation, necessitating diabetic shoes. -Forms completed for diabetic shoes and returned to patient

## 2023-09-05 ENCOUNTER — Telehealth: Payer: Self-pay

## 2023-09-05 NOTE — Telephone Encounter (Signed)
 I faxed out all paperwork requested by insurance to get pt hospital bed.

## 2023-09-09 ENCOUNTER — Ambulatory Visit (INDEPENDENT_AMBULATORY_CARE_PROVIDER_SITE_OTHER): Payer: Medicare Other | Admitting: Podiatry

## 2023-09-09 ENCOUNTER — Encounter: Payer: Self-pay | Admitting: Podiatry

## 2023-09-09 VITALS — Ht 63.0 in | Wt 257.9 lb

## 2023-09-09 DIAGNOSIS — M79675 Pain in left toe(s): Secondary | ICD-10-CM | POA: Diagnosis not present

## 2023-09-09 DIAGNOSIS — R600 Localized edema: Secondary | ICD-10-CM

## 2023-09-09 DIAGNOSIS — I739 Peripheral vascular disease, unspecified: Secondary | ICD-10-CM

## 2023-09-09 DIAGNOSIS — E119 Type 2 diabetes mellitus without complications: Secondary | ICD-10-CM

## 2023-09-09 DIAGNOSIS — M79674 Pain in right toe(s): Secondary | ICD-10-CM

## 2023-09-09 DIAGNOSIS — B351 Tinea unguium: Secondary | ICD-10-CM

## 2023-09-09 DIAGNOSIS — E1142 Type 2 diabetes mellitus with diabetic polyneuropathy: Secondary | ICD-10-CM | POA: Diagnosis not present

## 2023-09-09 DIAGNOSIS — M2141 Flat foot [pes planus] (acquired), right foot: Secondary | ICD-10-CM | POA: Diagnosis not present

## 2023-09-09 DIAGNOSIS — M2142 Flat foot [pes planus] (acquired), left foot: Secondary | ICD-10-CM

## 2023-09-09 NOTE — Progress Notes (Signed)
 ANNUAL DIABETIC FOOT EXAM  Subjective: Alexis Farmer presents today for annual diabetic foot exam. Patient states she has been ill and is no longer as mobile as she's been in the past. She now resides with her granddaughter and son in law in [place], [place]. She does have help to come in to assist her with bathing. Chief Complaint  Patient presents with   Nail Problem    Pt is here for [facility] unsure of last A1C PCP is Dr Clent Ridges and LOV was in 2 weeks ago.   Patient confirms h/o diabetes.  Patient denies any h/o foot wounds.  Patient has been diagnosed with neuropathy.  [name], MD is patient's PCP.  Past Medical History:  Diagnosis Date   Anxiety 09/13/2015   Arthritis    Atypical chest pain 01/15/2021   Blackhead 10/24/2021   Bradycardia 07/17/2016   Formatting of this note might be different from the original.  Last Assessment & Plan:   Found to be bradycardic at outside hospital. They stopped metoprolol and verapamil and heart rate has been normal since then. Echo appears to have been reassuring at the outside facility. Appears to be doing much better with regards to this. She'll continue to monitor for recurrent symptoms. 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  Last Assessment & Plan:   Continues to have issues with this.  She is following with a specialist and it sounds as though they are planning on an MRI.  Benign exam today.   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 Loletha Carrow of this note might be different from the original.  Dr. Thedore Mins      Last Assessment & Plan:   Recheck kidney function today.   Constipation 11/17/2019   Cough 01/25/2016   Formatting of this note might be different from the original.  Last Assessment & Plan:   Minimal nighttime cough. Improves when she washes her pillows consistently. Suspect allergies contributing.  She'll monitor.   Diabetes mellitus without complication (HCC)    one elevated reading/ no treatment   Disorder of rotator cuff 06/24/2018   Diverticulitis    Dysuria 03/22/2017   Formatting of this note might be different from the original.  Last Assessment & Plan:   Symptoms concerning for UTI.  Will check urinalysis.   Edema of foot 04/08/2014   Formatting of this note might be different from the original.  Last Assessment & Plan:   Chronic pedal edema. Suspect venous insufficiency. No orthopnea or shortness of breath. Advised elevation of her legs. Consider compression stockings in the future.   Elevated troponin 12/15/2020   Essential hypertension 04/08/2014   Formatting of this note might be different from the original.  Last Assessment & Plan:   Well-controlled on recheck.  Continue current regimen.   Fall 03/26/2018   Gastroenteritis 08/12/2021   Gastrointestinal hemorrhage 08/03/2015   GI bleed    High cholesterol    History of blood transfusion    Hyperglycemia due to type 2 diabetes mellitus (HCC) 03/11/2015   Hypertension    Hypertensive urgency 12/15/2020   Low back pain 04/08/2014   Morbid obesity (HCC) 04/08/2014   Formatting of this note might be different from the original.  Last Assessment & Plan:   Weight is stable. Discussed diet and exercise at length. Encouraged whatever exercise she can  do. Given diet information.   Palpitations 12/15/2020   Peripheral edema 12/22/2019   Postmenopausal bleeding 07/17/2016   Formatting of this note might be different from the original.  Last Assessment & Plan:   Recent D&C. Following with gynecology. Benign findings. Monitor for recurrence.   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  Last Assessment & Plan:   Continues to have issues with this.  She is following with a specialist and  it sounds as though they are planning on an MRI.  Benign exam today.   Recurrent falls 08/13/2019   Renal insufficiency    Skin cyst 10/24/2021   Stage 3a chronic kidney disease (HCC) 01/15/2021   Swelling of lower leg 07/19/2016   Vertigo 10/13/2015   Formatting of this note might be different from the original.  Last Assessment & Plan:   No recurrence. Discussed that meclizine is an as needed medication and that she does not need to take this daily. She will continue to monitor.   Patient Active Problem List   Diagnosis Date Noted   RSV infection 08/08/2023   Obesity (BMI 30-39.9) 08/04/2023   Diffuse pain 08/03/2023   Abdominal pain 08/03/2023   Abdominal pain, LLQ 08/03/2023   Cellulitis of left lower extremity 04/02/2023   Hyponatremia 04/01/2023   Acute on chronic heart failure with preserved ejection fraction (HFpEF) (HCC) 04/01/2023   Cellulitis 04/01/2023   Lower extremity weakness 04/01/2023   Inability to access health care due to transportation insecurity 03/31/2023   Mood disorder (HCC) 01/20/2023   Vision loss, bilateral 10/22/2022   Pre-syncope 10/22/2022   Ceruminosis, right 09/07/2022   Type 2 diabetes mellitus without complications (HCC) 08/14/2022   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/2022   Hallucinations, visual 07/20/2022   Need for pneumococcal vaccination 07/20/2022   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   Stage 3b chronic kidney disease (HCC) 03/21/2022   Ileus (HCC) 08/22/2021   Lymphedema 04/19/2021   Atherosclerosis of aorta (HCC) 01/16/2021   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/16/2021   Spinal stenosis of lumbar region 01/16/2021   Panic attack 01/15/2021   Other chronic pain 08/25/2020   Hypertension associated with diabetes (HCC) 08/25/2020   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) 05/30/2020   Retinopathy 05/30/2020   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   Proteinuria 12/22/2019   Physical deconditioning 08/13/2019   Abdominal aortic atherosclerosis (HCC) 07/24/2019   Morbid obesity with BMI of 45.0-49.9, adult (HCC) 06/09/2019   Hypercalcemia 03/18/2019   Round hole, unspecified eye 02/06/2019   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 08/13/2018   Abnormal gait 03/26/2018   Arthritis 03/26/2018   Thyroid nodule 01/20/2018   Cerumen impaction 01/21/2017   UTI (urinary tract infection) 09/24/2016   Rectal hemorrhage 07/29/2016   Carotid artery stenosis 07/19/2016   PAD (peripheral artery disease) (HCC) 07/19/2016   Chronic diastolic CHF (congestive heart failure) (HCC) 05/31/2016   Chronic fatigue 11/15/2015   Depression, recurrent (HCC) 10/13/2015   Osteoarthritis 10/13/2015   Mixed hyperlipidemia 08/26/2014   Uncontrolled type 2 diabetes mellitus with hyperglycemia (HCC) 04/08/2014   Diabetic polyneuropathy (HCC) 04/08/2014   Type 2 diabetes mellitus with other diabetic kidney complication (HCC) 04/08/2014   Past Surgical History:  Procedure Laterality Date  APPENDECTOMY     CHOLECYSTECTOMY     ECTOPIC PREGNANCY SURGERY     EYE SURGERY     bilateral cataracts   EYE SURGERY     02/11/2019 repair hole in right eye    gallbladder      HYSTEROSCOPY WITH D & C N/A 10/26/2016   Procedure: DILATATION AND CURETTAGE /HYSTEROSCOPY;  Surgeon: Christeen Douglas, MD;  Location: [facility] ORS;  Service: Gynecology;  Laterality: N/A;   HYSTEROSCOPY WITH D & C N/A 07/07/2018   Procedure: DILATATION AND CURETTAGE /HYSTEROSCOPY;  Surgeon: Christeen Douglas, MD;  Location: [facility] ORS;  Service: Gynecology;  Laterality: N/A;   THYROIDECTOMY, PARTIAL     Current Outpatient Medications on File Prior to Visit  Medication Sig Dispense Refill   acetaminophen (TYLENOL) 500 MG tablet Take 500 mg by mouth every 6 (six) hours as needed for mild pain or  headache.     Ascorbic Acid (VITAMIN C PO) Take 1 tablet by mouth daily.     aspirin EC 81 MG tablet Take 81 mg by mouth 2 (two) times a week. Swallow whole.     benzonatate (TESSALON) 200 MG capsule Take 200 mg by mouth 3 (three) times daily as needed for cough.     blood glucose meter kit and supplies KIT Accu chek, Dx code E11.65, check 3 times daily 1 each 0   Blood Pressure KIT 1 Device by Does not apply route daily. 1 kit 0   cloNIDine (CATAPRES) 0.1 MG tablet Take 1 tablet (0.1 mg total) by mouth 2 (two) times daily. 60 tablet 6   Dextromethorphan-guaiFENesin (MUCUS RELIEF DM PO) Take by mouth. Liquid cough syrup     glucose blood (ACCU-CHEK GUIDE) test strip USE AS INSTRUCTED 3 TIMES DAILY 300 strip 12   guaiFENesin (SCOT-TUSSIN EXPECTORANT) 100 MG/5ML liquid Take 20 mLs by mouth 3 (three) times daily as needed for cough.     hydrALAZINE (APRESOLINE) 100 MG tablet TAKE 1 TABLET (100 MG TOTAL) BY MOUTH 4 (FOUR) TIMES DAILY. 360 tablet 1   insulin NPH Human (NOVOLIN N) 100 UNIT/ML injection Inject 15-20 Units into the skin 2 (two) times daily before a meal. Inject 20 units in the morning an 15 units in the evening.     insulin regular (NOVOLIN R) 100 units/mL injection Inject 0-0.08 mLs (0-8 Units total) into the skin 3 (three) times daily before meals. 30 mL 11   Insulin Syringe-Needle U-100 (BD INSULIN SYRINGE U/F) 30G X 1/2" 0.5 ML MISC USE UP TO 5X PER DAY WITH INSULIN 2X (NPH) AND 3X (REGULAR) 500 each 12   ipratropium-albuterol (DUONEB) 0.5-2.5 (3) MG/3ML SOLN Take 3 mLs by nebulization 4 (four) times daily. Take scheduled 4 times daily until wheezing and shortness of breath from RSV improves. Then take as needed.     LORazepam (ATIVAN) 1 MG tablet Take 0.5 tablets (0.5 mg total) by mouth daily as needed for anxiety. Inform pt 0.5 dose on backorder has to cut this in 1/2 dose 15 tablet 0   Multiple Vitamins-Minerals (CENTRUM SILVER 50+WOMEN) TABS Take 1 tablet by mouth daily with  breakfast.     nebivolol (BYSTOLIC) 10 MG tablet TAKE 1 TABLET BY MOUTH EVERY DAY 90 tablet 0   nystatin (MYCOSTATIN/NYSTOP) powder Apply topically 2 (two) times daily. Under abdomen 60 g 11   polyethylene glycol powder (GLYCOLAX/MIRALAX) 17 GM/SCOOP powder Take 17 g by mouth daily as needed for moderate constipation or severe constipation. Can take up to 2x per day prn.  Mix with 8 ounces of liquid 3350 g 11   rosuvastatin (CRESTOR) 20 MG tablet TAKE 1 TABLET BY MOUTH EVERY DAY 90 tablet 3   spironolactone (ALDACTONE) 25 MG tablet Take 25 mg by mouth 3 (three) times a week. Monday Wednesday and friday     torsemide (DEMADEX) 10 MG tablet Take 1 tablet (10 mg total) by mouth daily. 90 tablet 3   valsartan (DIOVAN) 160 MG tablet TAKE 1 TABLET BY MOUTH EVERY DAY 90 tablet 0   budesonide (PULMICORT) 0.5 MG/2ML nebulizer solution Take 0.5 mg by nebulization daily.     No current facility-administered medications on file prior to visit.    Allergies  Allergen Reactions   Celexa [Citalopram]     Diarrhea upset stomach    Jardiance [Empagliflozin] Other (See Comments)    Reaction not recalled   Norvasc [Amlodipine]     Leg edema   Tape Other (See Comments)    Leaves the skin "raw" if left on for a period of time- tolerates paper tape   Penicillin V Rash   Penicillin V Potassium Rash   Penicillins Rash    Has patient had a PCN reaction causing immediate rash, facial/tongue/throat swelling, SOB or lightheadedness with hypotension: Yes Has patient had a PCN reaction causing severe rash involving mucus membranes or skin necrosis: No Has patient had a PCN reaction that required hospitalization No Has patient had a PCN reaction occurring within the last 10 years: Yes If all of the above answers are "NO", then may proceed with Cephalosporin use.    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  Tobacco Use   Smoking status: Never   Smokeless tobacco: Never  Vaping Use   Vaping status: Never Used  Substance and Sexual Activity   Alcohol use: No   Drug use: No   Sexual activity: Not Currently   Family History  Problem Relation Age of Onset   Diabetes Mother    Hypertension Mother    Stroke Mother    Diabetes Other    Healthy Father    Diabetes Sister    Heart disease Sister    Immunization History  Administered Date(s) Administered   Fluad Quad(high Dose 65+) 03/12/2019, 03/23/2020, 04/19/2021, 03/21/2022   Influenza Split 03/23/2020   Influenza, High Dose Seasonal PF 03/22/2017, 03/25/2018   Influenza-Unspecified 03/19/2015, 06/24/2016   PFIZER Comirnaty(Gray Top)Covid-19 Tri-Sucrose Vaccine 11/26/2020   PFIZER(Purple Top)SARS-COV-2 Vaccination 08/09/2019, 09/02/2019, 06/28/2020   PNEUMOCOCCAL CONJUGATE-20 07/18/2022   Zoster Recombinant(Shingrix) 07/06/2020, 09/10/2020     Review of Systems: Negative except as noted in the HPI.   Objective: There were no vitals filed for this visit.  Alexis Farmer is a pleasant 83 y.o. female in NAD. AAO X 3.  Diabetic foot exam was performed with the following findings:   Vascular Examination: CFT <4 seconds b/l. DP pulses diminished b/l. PT pulses diminished b/l. Digital hair absent. Skin temperature gradient warm to warm b/l. No ischemia or gangrene. No cyanosis or clubbing noted b/l. +2 pitting edema BLE.   Neurological Examination: Sensation grossly intact b/l with 10 gram monofilament. Vibratory sensation diminished b/l.  Dermatological Examination: Pedal skin thin, shiny and atrophic b/l. No open wounds. No interdigital macerations.   Toenails 1-5 b/l thick, discolored, elongated with subungual debris and pain on dorsal palpation.   No hyperkeratotic nor porokeratotic lesions present on today's visit.  Musculoskeletal Examination: Muscle strength 4/5 to all lower extremity muscle  groups bilaterally. Pes planus deformity noted bilateral LE. Utilizes wheelchair for mobility assistance.  Radiographs: None     Lab Results  Component Value Date   HGBA1C 9.7 08/01/2023   ADA Risk Categorization: High Risk  Patient has one or more of the following: Loss of protective sensation Absent pedal pulses Severe Foot deformity History of foot ulcer  Assessment: 1. Pain due to onychomycosis of toenails of both feet   2. Bilateral lower extremity edema   3. Pes planus of both feet   4. PAD (peripheral artery disease) (HCC)   5. Diabetic peripheral neuropathy associated with type 2 diabetes mellitus (HCC)   6. Encounter for diabetic foot exam (HCC)     Plan: Diabetic foot examination performed today.  All patient's and/or POA's questions/concerns addressed on today's visit. Mycotic toenails 1-5 debrided in length and girth without incident. Continue daily foot inspections and monitor blood glucose per PCP/Endocrinologist's recommendations. Continue soft, supportive shoe gear daily. Report any pedal injuries to medical professional. Call office if there are any questions/concerns. -Patient/POA to call should there be question/concern in the interim. Return in about 3 months (around 12/10/2023).  Freddie Breech, DPM      [facility] LOCATION: [address], [place] [address]                   Office [phone]   [facility] LOCATION: [address] [place], [place] [address] Office [phone]

## 2023-09-12 ENCOUNTER — Other Ambulatory Visit: Payer: Self-pay | Admitting: Emergency Medicine

## 2023-09-12 ENCOUNTER — Other Ambulatory Visit: Payer: Self-pay

## 2023-09-12 ENCOUNTER — Telehealth: Payer: Self-pay | Admitting: Cardiovascular Disease

## 2023-09-12 ENCOUNTER — Ambulatory Visit: Payer: Self-pay

## 2023-09-12 ENCOUNTER — Telehealth: Payer: Self-pay

## 2023-09-12 DIAGNOSIS — R002 Palpitations: Secondary | ICD-10-CM

## 2023-09-12 DIAGNOSIS — I6523 Occlusion and stenosis of bilateral carotid arteries: Secondary | ICD-10-CM

## 2023-09-12 DIAGNOSIS — R29898 Other symptoms and signs involving the musculoskeletal system: Secondary | ICD-10-CM

## 2023-09-12 DIAGNOSIS — I10 Essential (primary) hypertension: Secondary | ICD-10-CM

## 2023-09-12 DIAGNOSIS — E1159 Type 2 diabetes mellitus with other circulatory complications: Secondary | ICD-10-CM

## 2023-09-12 MED ORDER — HYDRALAZINE HCL 100 MG PO TABS: ORAL_TABLET | ORAL | 0 refills | Status: DC

## 2023-09-12 MED ORDER — NEBIVOLOL HCL 10 MG PO TABS: 10.0000 mg | ORAL_TABLET | Freq: Every day | ORAL | 0 refills | Status: DC

## 2023-09-12 NOTE — Telephone Encounter (Signed)
*  STAT* If patient is at the pharmacy, call can be transferred to refill team.   1. Which medications need to be refilled? (please list name of each medication and dose if known) hydrALAZINE (APRESOLINE) 100 MG tablet ; nebivolol (BYSTOLIC) 10 MG tablet    2. Would you like to learn more about the convenience, safety, & potential cost savings by using the [facility]?      3. Are you open to using the [facility] (Type [facility]. .   4. Which pharmacy/location (including street and city if local pharmacy) is medication to be sent to?[id number] - [place], [place] - [address]    5. Do they need a 30 day or 90 day supply? 90

## 2023-09-12 NOTE — Telephone Encounter (Signed)
Requested refills sent to preferred pharmacy.

## 2023-09-12 NOTE — Telephone Encounter (Signed)
  Chief Complaint: reporting elevated blood sugar Symptoms: none physical, blood sugar 318  Frequency: X1 Pertinent Negatives: Patient denies all symptoms Disposition: [] ED /[] Urgent Care (no appt availability in office) / [] Appointment(In office/virtual)/ []  [facility]/ [x] Home Care/ [] Refused Recommended Disposition /[] [facility]/ []  Follow-up with PCP Additional Notes:  Made contact and spoke with patient. She states her sugar was high during home health visit because she forgot to check her sugar before breakfast, she checked after her meal. States she is asymptomatic and sugar is 200's now, she states 200's is baseline for her this time of the day. She states she feels fine and not experiencing any symptoms. She states she will call back if anything changes. Educated on care advice as documented in protocol, patient verbalized understanding. Discussed reasons to call back. Please note call came from Home Health caregiver Thayer Ohm who was calling to report elevated blood glucose of 318 during home health visit this morning.   Reason for Disposition  Blood glucose 70-240 mg/dL (3.9 -16.1 mmol/L)  Protocols used: Diabetes - High Blood Sugar-A-AH

## 2023-09-12 NOTE — Telephone Encounter (Signed)
 Patient called, left VM to return the call to the office to speak to NT.

## 2023-09-12 NOTE — Telephone Encounter (Signed)
 DME sent

## 2023-09-12 NOTE — Telephone Encounter (Signed)
 1st attempt to reach patient to see if she is symptomatic and plan for decreasing glucose. No answer/left message.   Copied from CRM [id number]. Topic: Clinical - Red Word Triage >> Sep 12, 2023 12:19 PM Gurney Maxin H wrote: [facility] that prompted transfer to Nurse Triage: Thayer Ohm with [facility] is calling to report a blood sugar of 318 for the patient, states he  was not with patient and calling to report levels.  Thayer Ohm [phone]

## 2023-09-12 NOTE — Telephone Encounter (Signed)
 Copied from CRM [id number]. Topic: Clinical - Request for Lab/Test Order >> Sep 12, 2023 12:17 PM Gurney Maxin H wrote: Reason for CRM: Thayer Ohm with [facility] is calling to request an order for a manual wheelchair for patient, order can be sent to whoever the office uses.  Thayer Ohm [phone]

## 2023-09-12 NOTE — Telephone Encounter (Signed)
 Routed to Dr. Who handles diabetes.

## 2023-09-13 ENCOUNTER — Telehealth: Payer: Self-pay

## 2023-09-13 DIAGNOSIS — F411 Generalized anxiety disorder: Secondary | ICD-10-CM

## 2023-09-13 NOTE — Patient Instructions (Signed)
 Visit Information  Thank you for taking time to visit with me today. Please don't hesitate to contact me if I can be of assistance to you.   Following are the goals we discussed today:   Goals Addressed             This Visit's Progress    Management of anxiety       Interventions Today    Flowsheet Row Most Recent Value  Chronic Disease   Chronic disease during today's visit Other  [anxiety]  General Interventions   General Interventions Discussed/Reviewed General Interventions Reviewed, Durable Medical Equipment (DME)  [evaluation of current treatment plan for anxiety and patients adherence to plan as established by provider. Assessed anxiety symptoms.]  Durable Medical Equipment (DME) Other  [Advised patient RNCM will follow up on hospital bed referral on Monday 09/16/23.]  Communication with Social Work  Mental Health Interventions   Mental Health Discussed/Reviewed Mental Health Reviewed, Anxiety, Refer to Social Work for counseling  [Completed PHQ2 and PHQ2 9.  Active listening and support provided. Recommended referral to Child psychotherapist. Assessed if patient feels safe in her enviornment.  Provided patient mental health hot line and suicide and crisis prevention line.]  Refer to Social Work for counseling regarding Anxiety/Coping  Pharmacy Interventions   Pharmacy Dicussed/Reviewed Pharmacy Topics Reviewed  [Medications reviewed. Inquired if patient taking anxiety medication as prescribed. Discussed importance of taking anxiety medication as prescribed. Advised that medication coupled with counseling/therapy could help her with managing symptoms.]                  Our next appointment is by telephone on 09/18/23 at 10 am  Please call the care guide team at [phone] if you need to cancel or reschedule your appointment.   If you are experiencing a Mental Health or Behavioral Health Crisis or need someone to talk to, please call the Suicide and Crisis Lifeline:  988 call 1-800-273-TALK (toll free, 24 hour hotline)  The patient verbalized understanding of instructions, educational materials, and care plan provided today and DECLINED offer to receive copy of patient instructions, educational materials, and care plan.   [name] RN, BSN, CCM [employer], Population Health Case Manager Phone: [phone]

## 2023-09-13 NOTE — Patient Outreach (Signed)
 Care Coordination   Follow Up Visit Note   09/14/2023 Name: Alexis Farmer MRN: [id number] DOB: 1940-11-08  Alexis Farmer is a 83 y.o. year old female who sees [name], MD for primary care. I spoke with  [name] by phone today.  What matters to the patients health and wellness today?  Returned call to patient as requested.  Patient states she is not happy anymore. She states no one wants to do anything for her. She states she feels she made a mistake moving in with her granddaughter and her husband. Patient states she is not being harmed or threatened in any way.  Patient states her daughter is also living with her granddaughter. She states her daughter doesn't want to do anything for her.  Patient states she and her daughter argue and she has a tendency to scream.  Patient verbally agreed to referral to Child psychotherapist.  She states she has the phone number to the suicide hotline and crisis phone number for a previous after visit summary. This RNCM provided patient crisis and hotline phone number.  Patient states she does not want to hurt herself or anyone else.  Patient states she is prescribed an anxiety medication but does not want to take it. Patient states she had a follow up visit with her primary care provider for hospital bed referral.  She states, " it looks like the hospital bed is never coming."   Goals Addressed             This Visit's Progress    Management of anxiety       Interventions Today    Flowsheet Row Most Recent Value  Chronic Disease   Chronic disease during today's visit Other  [anxiety]  General Interventions   General Interventions Discussed/Reviewed General Interventions Reviewed, Durable Medical Equipment (DME)  [evaluation of current treatment plan for anxiety and patients adherence to plan as established by provider. Assessed anxiety symptoms.]  Durable Medical Equipment (DME) Other  [Advised patient RNCM will follow up on hospital bed referral  on Monday 09/16/23.]  Communication with Social Work  Mental Health Interventions   Mental Health Discussed/Reviewed Mental Health Reviewed, Anxiety, Refer to Social Work for counseling  [Completed PHQ2 and PHQ2 9.  Active listening and support provided. Recommended referral to Child psychotherapist. Assessed if patient feels safe in her enviornment.  Provided patient mental health hot line and suicide and crisis prevention line.]  Refer to Social Work for counseling regarding Anxiety/Coping  Pharmacy Interventions   Pharmacy Dicussed/Reviewed Pharmacy Topics Reviewed  [Medications reviewed. Inquired if patient taking anxiety medication as prescribed. Discussed importance of taking anxiety medication as prescribed. Advised that medication coupled with counseling/therapy could help her with managing symptoms.]                  SDOH assessments and interventions completed:  Yes  SDOH Interventions Today    Flowsheet Row Most Recent Value  SDOH Interventions   Depression Interventions/Treatment  Counseling        Care Coordination Interventions:  No, not indicated   Follow up plan: Follow up call scheduled for as previously scheduled for 09/18/23     Encounter Outcome:  Patient Visit Completed   [name] RN, BSN, CCM [place]  [facility], Population Health Case Manager Phone: [phone]

## 2023-09-16 ENCOUNTER — Telehealth: Payer: Self-pay

## 2023-09-16 NOTE — Progress Notes (Unsigned)
 Complex Care Management Note Care Guide Note  09/16/2023 Name: Alexis Farmer MRN: [id number] DOB: [birth year]   Complex Care Management Outreach Attempts: An unsuccessful telephone outreach was attempted today to offer the patient information about available complex care management services.  Follow Up Plan:  Additional outreach attempts will be made to offer the patient complex care management information and services.   Encounter Outcome:  No Answer  Penne Lash , RMA     [place]  [facility] [place], [facility] Guide  Direct Dial: [phone]  Website: [email or web address]

## 2023-09-16 NOTE — Telephone Encounter (Signed)
 Copied from CRM [id number]. Topic: Clinical - Medical Advice >> Sep 16, 2023  9:50 AM Almira Coaster wrote: Reason for CRM: Patient was seen on 08/30/2023 with Dr.Walsh and still has a lingering cough and would like to know if Dr.Walsh is able to prescribe something. During her visit patient rejected the medication but changed her mind.

## 2023-09-17 ENCOUNTER — Telehealth: Payer: Self-pay

## 2023-09-17 NOTE — Telephone Encounter (Signed)
 Verbal orders given

## 2023-09-17 NOTE — Telephone Encounter (Signed)
 Copied from CRM [id number]. Topic: Clinical - Home Health Verbal Orders >> Sep 17, 2023  9:14 AM Truddie Crumble wrote: Caller/Agency: nancy from [facility] Callback Number: [phone] Service Requested: social worker because the patient is depressed  Frequency: evaluation Any new concerns about the patient? No

## 2023-09-18 ENCOUNTER — Telehealth: Payer: Self-pay

## 2023-09-18 ENCOUNTER — Ambulatory Visit: Payer: Self-pay

## 2023-09-18 NOTE — Telephone Encounter (Signed)
 Called and offered Alexis Farmer an appointment but she declined due to having to get transportation and she said she was just here and Dr. Clent Ridges discussed this cough with her and heard and saw her coughing. Alexis Farmer wants Dr. Clent Ridges to call something in for the cough. Alexis Farmer denies any other symptoms at this time.

## 2023-09-18 NOTE — Telephone Encounter (Signed)
 Copied from CRM [id number]. Topic: General - Other >> Sep 18, 2023 12:20 PM Florestine Avers wrote: Reason for CRM: [place] Miners RN Case Manager is requesting a call back and be reached at [phone].

## 2023-09-18 NOTE — Patient Outreach (Addendum)
 Care Coordination   Follow Up Visit Note   09/18/2023 Name: Alexis Farmer MRN: [id number] DOB: Jan 10, 1941  Alexis Farmer is a 83 y.o. year old female who sees [name], MD for primary care. I spoke with  Alexis Farmer by phone today.  What matters to the patients health and wellness today?  Patient states she continues to have an ongoing cough that is keeping her up at night.  She states she is taking over the counter cough medication such as guafasin which is causing her blood sugars to increase.  She reports blood sugars over the last week or so increasing to 300-500.  Patient states she is very frustrated that she is not being helped with her coughing issues.  Patient states she saw her primary care provider on 08/30/23 and reported her ongoing cough.  She reports calling her primary care office today and leaving a message with the nurse about the ongoing coughing.  Received return call from patients granddaughter Alexis Farmer stating she had followed up on patients hospital bed and requested order to be re-faxed to [facility].  Granddaughter states patient and patients daughter are living with her. Granddaughter expresses there is family conflict between patient  and her daughter and is receptive to Child psychotherapist referral.  Telephone call to [facility] to follow up on hospital bed status.  Spoke with Alexis Farmer who states  [employer] has requested the provider narrative to continue processing request.  She states last notification to the office was on 08/19/23.  Called to provider office and spoke with Alexis Farmer. Left message requesting return call.     Goals Addressed             This Visit's Progress    Patient Stated:  " Getting my diabetes under control and manage heart failure and chronic health conditions"       Interventions Today    Flowsheet Row Most Recent Value  Chronic Disease   Chronic disease during today's visit Diabetes, Congestive Heart Failure (CHF), Other  [cough]   General Interventions   General Interventions Discussed/Reviewed General Interventions Reviewed, Doctor Visits, Durable Medical Equipment (DME), Communication with  [evaluation of current treatment plan for listed health conditions and patients adherence to plan as established by provider. Assessed for HF symptoms, blood sugar readings and ongoing coughing symptom.]  Doctor Visits Discussed/Reviewed Doctor Visits Reviewed  Annabell Sabal upcoming provider visits. Offered to schedule appointment with primary care provider regarding ongoing coughing symptoms.]  Durable Medical Equipment (DME) Other  [Message left with patients granddaughter regarding hospital bed request. Stressed importance of having / acquring scales for HF management]  Communication with PCP/Specialists  [message sent to primary care provider regarding patients complaint of cough and request for medication.]  Exercise Interventions   Exercise Discussed/Reviewed Physical Activity  [Confirmed patient continues to receive PT/OT. Advised to do therapy instructed exercises on non therapy days for ongoing strengthening / balance.]  Education Interventions   Education Provided Provided Education  [discussed patient reporting elevated morning blood sugars due to taking liquid cough syrup frequently.]  Pharmacy Interventions   Pharmacy Dicussed/Reviewed Pharmacy Topics Reviewed  [medications reviewed. Discussed compliance with medications.]  Safety Interventions   Safety Discussed/Reviewed Fall Risk  [assessed for falls.]                SDOH assessments and interventions completed:  No     Care Coordination Interventions:  Yes, provided   Follow up plan: Follow up call scheduled for 10/10/23 at 2:30 pm  Encounter Outcome:  Patient Visit Completed   [name] RN, BSN, CCM [place]  [facility], Population Health Case Manager Phone: [phone]

## 2023-09-18 NOTE — Patient Instructions (Signed)
 Visit Information  Thank you for taking time to visit with me today. Please don't hesitate to contact me if I can be of assistance to you.   Following are the goals we discussed today:   Goals Addressed             This Visit's Progress    Patient Stated:  " Getting my diabetes under control and manage heart failure and chronic health conditions"       Interventions Today    Flowsheet Row Most Recent Value  Chronic Disease   Chronic disease during today's visit Diabetes, Congestive Heart Failure (CHF), Other  [cough]  General Interventions   General Interventions Discussed/Reviewed General Interventions Reviewed, Doctor Visits, Durable Medical Equipment (DME), Communication with  [evaluation of current treatment plan for listed health conditions and patients adherence to plan as established by provider. Assessed for HF symptoms, blood sugar readings and ongoing coughing symptom.]  Doctor Visits Discussed/Reviewed Doctor Visits Reviewed  Annabell Sabal upcoming provider visits. Offered to schedule appointment with primary care provider regarding ongoing coughing symptoms.]  Durable Medical Equipment (DME) Other  [Message left with patients granddaughter regarding hospital bed request. Stressed importance of having / acquring scales for HF management. jCall to [facility] to follow up on referral request for hospital bed.]  Communication with PCP/Specialists  [message sent to primary care provider regarding patients complaint of cough and request for medication.]  Exercise Interventions   Exercise Discussed/Reviewed Physical Activity  [Confirmed patient continues to receive PT/OT. Advised to do therapy instructed exercises on non therapy days for ongoing strengthening / balance.]  Education Interventions   Education Provided Provided Education  [discussed patient reporting elevated morning blood sugars she feels due to taking liquid cough syrup frequently.]  Mental Health Interventions   Mental  Health Discussed/Reviewed Other  [Active listening and support with patient and granddaughter.]  Pharmacy Interventions   Pharmacy Dicussed/Reviewed Pharmacy Topics Reviewed  [medications reviewed. Discussed compliance with medications.]  Safety Interventions   Safety Discussed/Reviewed Fall Risk  [assessed for falls.]                  Our next appointment is by telephone on 10/10/23 at 2:30 pm  Please call the care guide team at [phone] if you need to cancel or reschedule your appointment.   If you are experiencing a Mental Health or Behavioral Health Crisis or need someone to talk to, please call the Suicide and Crisis Lifeline: 988 call 1-800-273-TALK (toll free, 24 hour hotline)  The patient verbalized understanding of instructions, educational materials, and care plan provided today and DECLINED offer to receive copy of patient instructions, educational materials, and care plan.   [name] RN, BSN, CCM [employer], Population Health Case Manager Phone: [phone]

## 2023-09-18 NOTE — Telephone Encounter (Addendum)
 Granddaughter called and said her grandmother needs an antibiotic because she is still coughing and it has some phlegm. She stated that they do not want to go to her lungs. She would it sent to [facility]. Also they did not received a DME diabetic shoe order.

## 2023-09-18 NOTE — Telephone Encounter (Signed)
 Copied from CRM [id number]. Topic: Clinical - Medication Question >> Sep 18, 2023  8:09 AM Kathryne Eriksson wrote: Reason for CRM: Requesting Cough Medication >> Sep 18, 2023  8:11 AM Kathryne Eriksson wrote: Patient is calling in requesting a prescription be called into the pharmacy in regards to a lingering cough she's been experiencing since she was last seen in office. Patient call back number is [phone].

## 2023-09-18 NOTE — Telephone Encounter (Signed)
 Left message to return call to our office.

## 2023-09-19 ENCOUNTER — Other Ambulatory Visit: Payer: Self-pay | Admitting: Family Medicine

## 2023-09-19 NOTE — Telephone Encounter (Signed)
 Left message to return call to our office.  Okay to relay message to pt. Please document when pt is spoke to.

## 2023-09-19 NOTE — Telephone Encounter (Signed)
 Called pt  and spoke to the nurse that is there with pt and she does not have inhaler, no fever there is SOB at night lungs is clear. The nurse stated the there is swelling in the thigh, and there no heart palpitations. Nurse thinks 02 at night would help pt sleep better.  When she lays back it feel like her back is coming though her front.  No decrease in urine output. Just burning in the pelvis area when she goes. Nurse did state that pt is depressed.

## 2023-09-19 NOTE — Telephone Encounter (Signed)
 Patient called to return your call. Please call back and advise.

## 2023-09-19 NOTE — Telephone Encounter (Signed)
 Called and spoke to pt about needing to go to the ER she laughed and said ok. When we got ready to hang up she stated that she would think about going.

## 2023-09-19 NOTE — Telephone Encounter (Addendum)
 Reason for CRM: Patient called and said that her Grand daughter did not want to contacted Lilian Coma) then patient said nevermind, leave it as is.   Previous response from Dr Clent Ridges regarding message below:   Recommend she have xray as this could be fluid over load and not bacterial

## 2023-09-19 NOTE — Telephone Encounter (Signed)
 Spoke to [employer] and fax over requested information to the insurance

## 2023-09-19 NOTE — Telephone Encounter (Signed)
 Also informed  her  that Dr. Clent Ridges would like her to get an x-ray.

## 2023-09-19 NOTE — Telephone Encounter (Signed)
 Left message to return call to our office.

## 2023-09-20 ENCOUNTER — Telehealth: Payer: Self-pay | Admitting: Family Medicine

## 2023-09-20 NOTE — Telephone Encounter (Signed)
 Patient sees Dr Clent Ridges. Patient was wondering if Dr Lorin Picket would take over her care.

## 2023-09-20 NOTE — Telephone Encounter (Signed)
 See me

## 2023-09-23 ENCOUNTER — Ambulatory Visit: Payer: Self-pay | Admitting: *Deleted

## 2023-09-23 ENCOUNTER — Other Ambulatory Visit: Payer: Self-pay

## 2023-09-23 ENCOUNTER — Encounter: Payer: Self-pay | Admitting: *Deleted

## 2023-09-23 NOTE — Telephone Encounter (Signed)
 Dr Lorin Picket is not taking patients at this time.

## 2023-09-23 NOTE — Patient Outreach (Signed)
 Complex Care Management   Visit Note  09/23/2023  Name:  Alexis Farmer MRN: [id number] DOB: February 11, 1941  Situation: Referral received for Complex Care Management related to [place]/Behavioral Health diagnosis depression  I obtained verbal consent from (received previously).  Visit completed with patient   on the phone  Background:   Past Medical History:  Diagnosis Date   Anxiety 09/13/2015   Arthritis    Atypical chest pain 01/15/2021   Blackhead 10/24/2021   Bradycardia 07/17/2016   Formatting of this note might be different from the original.  Last Assessment & Plan:   Found to be bradycardic at outside hospital. They stopped metoprolol and verapamil and heart rate has been normal since then. Echo appears to have been reassuring at the outside facility. Appears to be doing much better with regards to this. She'll continue to monitor for recurrent symptoms. 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  Last Assessment & Plan:   Continues to have issues with this.  She is following with a specialist and it sounds as though they are planning on an MRI.  Benign exam today.   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 Loletha Carrow of this note might be different from the original.  Dr. Thedore Mins      Last Assessment & Plan:   Recheck kidney function today.   Constipation 11/17/2019   Cough 01/25/2016   Formatting of this note might be different from the original.  Last Assessment & Plan:   Minimal nighttime cough. Improves when she washes her pillows consistently. Suspect allergies contributing. She'll monitor.   Diabetes mellitus without complication (HCC)    one elevated reading/ no treatment   Disorder of rotator cuff 06/24/2018   Diverticulitis    Dysuria 03/22/2017   Formatting of this note might be different from the original.  Last Assessment & Plan:   Symptoms  concerning for UTI.  Will check urinalysis.   Edema of foot 04/08/2014   Formatting of this note might be different from the original.  Last Assessment & Plan:   Chronic pedal edema. Suspect venous insufficiency. No orthopnea or shortness of breath. Advised elevation of her legs. Consider compression stockings in the future.   Elevated troponin 12/15/2020   Essential hypertension 04/08/2014   Formatting of this note might be different from the original.  Last Assessment & Plan:   Well-controlled on recheck.  Continue current regimen.   Fall 03/26/2018   Gastroenteritis 08/12/2021   Gastrointestinal hemorrhage 08/03/2015   GI bleed    High cholesterol    History of blood transfusion    Hyperglycemia due to type 2 diabetes mellitus (HCC) 03/11/2015   Hypertension    Hypertensive urgency 12/15/2020   Low back pain 04/08/2014   Morbid obesity (HCC) 04/08/2014   Formatting of this note might be different from the original.  Last Assessment & Plan:   Weight is stable. Discussed diet and exercise at length. Encouraged whatever exercise she can do. Given diet information.   Palpitations 12/15/2020   Peripheral edema 12/22/2019   Postmenopausal bleeding 07/17/2016   Formatting of this note might be different from the original.  Last Assessment & Plan:   Recent D&C. Following with gynecology. Benign findings. Monitor for recurrence.   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  Last Assessment & Plan:   Continues to have issues with this.  She is following with a specialist and it sounds as though they are planning on an MRI.  Benign exam today.   Recurrent falls 08/13/2019   Renal insufficiency    Skin cyst 10/24/2021   Stage 3a chronic kidney disease (HCC) 01/15/2021   Swelling of lower leg 07/19/2016   Vertigo 10/13/2015   Formatting of this note  might be different from the original.  Last Assessment & Plan:   No recurrence. Discussed that meclizine is an as needed medication and that she does not need to take this daily. She will continue to monitor.    Assessment: Patient Reported Symptoms:  Cognitive Able to follow simple commands, Alert and oriented to person, place, and time, Normal speech and language skills, Requires Assistance Decision Making  Neurological No symptoms reported    HEENT No symptoms reported    Cardiovascular No symptoms reported    Respiratory Shortness of breath    Endocrine Blurry vision    Gastrointestinal No symptoms reported    Genitourinary Frequency    Integumentary Not assessed    Musculoskeletal Difficulty walking, Unsteady gait    Psychosocial Depression - if selected complete PHQ 2-9     Vitals:    Medications Reviewed Today     Reviewed by Wenda Overland, LCSW (Social Worker) on 09/23/23 at 1533  Med List Status: <None>   Medication Order Taking? Sig Documenting Provider Last Dose Status Informant  acetaminophen (TYLENOL) 500 MG tablet [id number] No Take 500 mg by mouth every 6 (six) hours as needed for mild pain or headache. [provider] Taking Active Self  Ascorbic Acid (VITAMIN C PO) [id number] No Take 1 tablet by mouth daily. [provider] Taking Active Self  aspirin EC 81 MG tablet [id number] No Take 81 mg by mouth 2 (two) times a week. Swallow whole. [provider] Taking Active Self           Med Note Demetrius Revel, Levan Hurst Jul 10, 2022 10:42 AM)    benzonatate (TESSALON) 200 MG capsule [id number] No Take 200 mg by mouth 3 (three) times daily as needed for cough. [provider] Taking Active Self  blood glucose meter kit and supplies KIT [id number] No Accu chek, Dx code E11.65, check 3 times daily Glori Luis, MD Taking Active Self  Blood Pressure KIT [id number] No 1 Device by Does not apply route daily. McLean-Scocuzza, Pasty Spillers, MD Taking Active Self  budesonide (PULMICORT) 0.5 MG/2ML nebulizer solution [id number] No Take 0.5 mg by nebulization daily. [provider] Taking Expired 08/03/23 2359   cloNIDine (CATAPRES) 0.1 MG tablet [id number] No Take 1 tablet (0.1 mg total) by mouth 2 (two) times daily. Mosetta Pigeon, MD Taking Active   Dextromethorphan-guaiFENesin (MUCUS RELIEF DM PO) [id number] No Take by mouth. Liquid cough syrup [provider] Taking Active Self  glucose blood (ACCU-CHEK GUIDE) test strip [id number] No USE AS INSTRUCTED 3 TIMES DAILY [name], MD Taking Active Self  guaiFENesin (SCOT-TUSSIN EXPECTORANT) 100 MG/5ML liquid [id number] No Take 20 mLs by mouth 3 (three) times daily as needed for cough. [provider] Taking Active   hydrALAZINE (APRESOLINE) 100 MG tablet [id number]  TAKE 1 TABLET (100 MG TOTAL) BY MOUTH 4 (FOUR) TIMES DAILY. Must keep upcoming appointment on 10/21/23 for future refills. Antonieta Iba, MD  Active  insulin NPH Human (NOVOLIN N) 100 UNIT/ML injection [id number] No Inject 15-20 Units into the skin 2 (two) times daily before a meal. Inject 20 units in the morning an 15 units in the evening. [provider] Taking Active   insulin regular (NOVOLIN R) 100 units/mL injection [id number] No Inject 0-0.08 mLs (0-8 Units total) into the skin 3 (three) times daily before meals. Romero Belling, MD Taking Active Self  Insulin Syringe-Needle U-100 (BD INSULIN SYRINGE U/F) 30G X 1/2" 0.5 ML MISC [id number] No USE UP TO 5X PER DAY WITH INSULIN 2X (NPH) AND 3X (REGULAR) [name], MD Taking Active Self  ipratropium-albuterol (DUONEB) 0.5-2.5 (3) MG/3ML SOLN [id number] No Take 3 mLs by nebulization 4 (four) times daily. Take scheduled 4 times daily until wheezing and shortness of breath from RSV improves. Then take as needed. [provider] Taking Active Pharmacy Records  LORazepam (ATIVAN) 1 MG tablet [id number] No Take 0.5 tablets (0.5 mg total) by  mouth daily as needed for anxiety. Inform pt 0.5 dose on backorder has to cut this in 1/2 dose [name], MD Taking Active   Multiple Vitamins-Minerals (CENTRUM SILVER 50+WOMEN) TABS [id number] No Take 1 tablet by mouth daily with breakfast. [provider] Taking Active Self  nebivolol (BYSTOLIC) 10 MG tablet [id number]  Take 1 tablet (10 mg total) by mouth daily. Must keep upcoming appointment on 10/21/23 for future refills. Antonieta Iba, MD  Active   nystatin (MYCOSTATIN/NYSTOP) powder [id number] No Apply topically 2 (two) times daily. Under abdomen [name], MD Taking Active Self  polyethylene glycol powder (GLYCOLAX/MIRALAX) 17 GM/SCOOP powder [id number] No Take 17 g by mouth daily as needed for moderate constipation or severe constipation. Can take up to 2x per day prn. Mix with 8 ounces of liquid McLean-Scocuzza, Pasty Spillers, MD Taking Active Self  rosuvastatin (CRESTOR) 20 MG tablet [id number] No TAKE 1 TABLET BY MOUTH EVERY DAY McLean-Scocuzza, Pasty Spillers, MD Taking Active Self           Med Note Orvan Falconer Apr 01, 2023  1:08 PM)    spironolactone (ALDACTONE) 25 MG tablet [id number] No Take 25 mg by mouth 3 (three) times a week. Monday Wednesday and friday [provider] Taking Active Self  torsemide (DEMADEX) 10 MG tablet [id number] No Take 1 tablet (10 mg total) by mouth daily. McLean-Scocuzza, Pasty Spillers, MD Taking Active Self  valsartan (DIOVAN) 160 MG tablet [id number] No TAKE 1 TABLET BY MOUTH EVERY DAY Gollan, Tollie Pizza, MD Taking Active             Recommendation:   CSW recommending ongoing mental health follow up for depression  Follow Up Plan:   Telephone follow-up in 2 weeks  Cornellius Kropp, LCSW [facility]  [facility], [facility] Licensed Clinical Social Worker Care Coordinator  Direct Dial: [phone]

## 2023-09-23 NOTE — Patient Instructions (Signed)
 Visit Information  Thank you for taking time to visit with me today. Please don't hesitate to contact me if I can be of assistance to you before our next scheduled appointment.  Our next appointment is by telephone on 10/10/23 at 11am Please call the care guide team at [phone] if you need to cancel or reschedule your appointment.   Following is a copy of your care plan:   Goals Addressed             This Visit's Progress    [facility] Social Work Care Plan       Problems:   Family and relationship dysfunction  CSW Clinical Goal(s):   Over the next 90 days the Patient will demonstrate a reduction in symptoms related to Depression: depressed mood hopelessness loss of energy/fatigue .  Interventions:  Mental Health:  Evaluation of current treatment plan related to Depression: depressed mood, hopelessness, and impaired memory and Stress at home Active listening / Reflection utilized Behavioral Activation reviewed Depression screen reviewed Discussed referral options to connect for ongoing therapy: patient willing to consider ongoing therapy Emotional Support Provided Motivational Interviewing employed PHQ2/PHQ9 completed  Patient Goals/Self-Care Activities:  Continue taking your medication as prescribed.    Plan:   Telephone follow up appointment with care management team member scheduled for:  10/10/23         Please call the Suicide and Crisis Lifeline: 988 if you are experiencing a Mental Health or Behavioral Health Crisis or need someone to talk to.  Patient verbalizes understanding of instructions and care plan provided today and agrees to view in MyChart. Active MyChart status and patient understanding of how to access instructions and care plan via MyChart confirmed with patient.     Basil Blakesley, LCSW [place]  [facility], [facility] Licensed Clinical Social Worker Care Coordinator  Direct Dial: [phone]

## 2023-09-24 ENCOUNTER — Other Ambulatory Visit: Payer: Self-pay | Admitting: Cardiovascular Disease

## 2023-09-24 DIAGNOSIS — E1159 Type 2 diabetes mellitus with other circulatory complications: Secondary | ICD-10-CM

## 2023-09-24 DIAGNOSIS — R002 Palpitations: Secondary | ICD-10-CM

## 2023-09-26 ENCOUNTER — Telehealth: Payer: Self-pay

## 2023-09-26 NOTE — Telephone Encounter (Signed)
 Copied from CRM [id number]. Topic: Clinical - Home Health Verbal Orders >> Sep 26, 2023 12:03 PM Truddie Crumble wrote: Caller/Agency: chris from [facility] Callback Number: [phone] Service Requested: Physical Therapy and home health aid Frequency: one time a week or nine weeks for both  Any new concerns about the patient? No

## 2023-09-26 NOTE — Telephone Encounter (Signed)
 Verbal orders given

## 2023-09-29 ENCOUNTER — Other Ambulatory Visit: Payer: Self-pay

## 2023-09-29 ENCOUNTER — Inpatient Hospital Stay
Admission: EM | Admit: 2023-09-29 | Discharge: 2023-10-15 | DRG: 286 | Disposition: A | Discharge status: Skilled Nursing Facility | Source: Skilled Nursing Facility | Attending: Internal Medicine | Admitting: Internal Medicine

## 2023-09-29 ENCOUNTER — Emergency Department

## 2023-09-29 DIAGNOSIS — Z91048 Other nonmedicinal substance allergy status: Secondary | ICD-10-CM

## 2023-09-29 DIAGNOSIS — I6523 Occlusion and stenosis of bilateral carotid arteries: Secondary | ICD-10-CM

## 2023-09-29 DIAGNOSIS — Z8249 Family history of ischemic heart disease and other diseases of the circulatory system: Secondary | ICD-10-CM

## 2023-09-29 DIAGNOSIS — N179 Acute kidney failure, unspecified: Secondary | ICD-10-CM | POA: Diagnosis present

## 2023-09-29 DIAGNOSIS — T501X6A Underdosing of loop [high-ceiling] diuretics, initial encounter: Secondary | ICD-10-CM | POA: Diagnosis present

## 2023-09-29 DIAGNOSIS — I2721 Secondary pulmonary arterial hypertension: Secondary | ICD-10-CM | POA: Diagnosis present

## 2023-09-29 DIAGNOSIS — R002 Palpitations: Secondary | ICD-10-CM

## 2023-09-29 DIAGNOSIS — E1165 Type 2 diabetes mellitus with hyperglycemia: Secondary | ICD-10-CM

## 2023-09-29 DIAGNOSIS — Z794 Long term (current) use of insulin: Secondary | ICD-10-CM

## 2023-09-29 DIAGNOSIS — G8929 Other chronic pain: Secondary | ICD-10-CM | POA: Diagnosis present

## 2023-09-29 DIAGNOSIS — I130 Hypertensive heart and chronic kidney disease with heart failure and stage 1 through stage 4 chronic kidney disease, or unspecified chronic kidney disease: Principal | ICD-10-CM | POA: Diagnosis present

## 2023-09-29 DIAGNOSIS — E66813 Obesity, class 3: Secondary | ICD-10-CM | POA: Diagnosis present

## 2023-09-29 DIAGNOSIS — E782 Mixed hyperlipidemia: Secondary | ICD-10-CM | POA: Diagnosis present

## 2023-09-29 DIAGNOSIS — E1122 Type 2 diabetes mellitus with diabetic chronic kidney disease: Secondary | ICD-10-CM | POA: Diagnosis present

## 2023-09-29 DIAGNOSIS — E1151 Type 2 diabetes mellitus with diabetic peripheral angiopathy without gangrene: Secondary | ICD-10-CM | POA: Diagnosis present

## 2023-09-29 DIAGNOSIS — N1832 Chronic kidney disease, stage 3b: Secondary | ICD-10-CM | POA: Diagnosis present

## 2023-09-29 DIAGNOSIS — Z79899 Other long term (current) drug therapy: Secondary | ICD-10-CM

## 2023-09-29 DIAGNOSIS — Z7982 Long term (current) use of aspirin: Secondary | ICD-10-CM

## 2023-09-29 DIAGNOSIS — Z713 Dietary counseling and surveillance: Secondary | ICD-10-CM

## 2023-09-29 DIAGNOSIS — F419 Anxiety disorder, unspecified: Secondary | ICD-10-CM | POA: Diagnosis present

## 2023-09-29 DIAGNOSIS — Z8572 Personal history of non-Hodgkin lymphomas: Secondary | ICD-10-CM

## 2023-09-29 DIAGNOSIS — R0602 Shortness of breath: Principal | ICD-10-CM

## 2023-09-29 DIAGNOSIS — I2510 Atherosclerotic heart disease of native coronary artery without angina pectoris: Secondary | ICD-10-CM | POA: Diagnosis present

## 2023-09-29 DIAGNOSIS — E861 Hypovolemia: Secondary | ICD-10-CM | POA: Diagnosis not present

## 2023-09-29 DIAGNOSIS — I959 Hypotension, unspecified: Secondary | ICD-10-CM | POA: Diagnosis not present

## 2023-09-29 DIAGNOSIS — Z880 Allergy status to penicillin: Secondary | ICD-10-CM

## 2023-09-29 DIAGNOSIS — Z5982 Transportation insecurity: Secondary | ICD-10-CM

## 2023-09-29 DIAGNOSIS — I5033 Acute on chronic diastolic (congestive) heart failure: Secondary | ICD-10-CM | POA: Diagnosis not present

## 2023-09-29 DIAGNOSIS — I272 Pulmonary hypertension, unspecified: Secondary | ICD-10-CM

## 2023-09-29 DIAGNOSIS — Z7951 Long term (current) use of inhaled steroids: Secondary | ICD-10-CM

## 2023-09-29 DIAGNOSIS — Z1152 Encounter for screening for COVID-19: Secondary | ICD-10-CM

## 2023-09-29 DIAGNOSIS — I10 Essential (primary) hypertension: Secondary | ICD-10-CM

## 2023-09-29 DIAGNOSIS — K5900 Constipation, unspecified: Secondary | ICD-10-CM | POA: Diagnosis not present

## 2023-09-29 DIAGNOSIS — I160 Hypertensive urgency: Secondary | ICD-10-CM | POA: Diagnosis present

## 2023-09-29 DIAGNOSIS — E876 Hypokalemia: Secondary | ICD-10-CM | POA: Diagnosis present

## 2023-09-29 DIAGNOSIS — I509 Heart failure, unspecified: Secondary | ICD-10-CM

## 2023-09-29 DIAGNOSIS — Z833 Family history of diabetes mellitus: Secondary | ICD-10-CM

## 2023-09-29 DIAGNOSIS — Z9049 Acquired absence of other specified parts of digestive tract: Secondary | ICD-10-CM

## 2023-09-29 DIAGNOSIS — Z751 Person awaiting admission to adequate facility elsewhere: Secondary | ICD-10-CM

## 2023-09-29 LAB — CBC
HCT: 35.9 % — ABNORMAL LOW (ref 36.0–46.0)
Hemoglobin: 11.6 g/dL — ABNORMAL LOW (ref 12.0–15.0)
MCH: 28.9 pg (ref 26.0–34.0)
MCHC: 32.3 g/dL (ref 30.0–36.0)
MCV: 89.5 fL (ref 80.0–100.0)
Platelets: 670 10*3/uL — ABNORMAL HIGH (ref 150–400)
RBC: 4.01 MIL/uL (ref 3.87–5.11)
RDW: 14.7 % (ref 11.5–15.5)
WBC: 7.8 10*3/uL (ref 4.0–10.5)
nRBC: 0 % (ref 0.0–0.2)

## 2023-09-29 LAB — COMPREHENSIVE METABOLIC PANEL WITH GFR
ALT: 24 U/L (ref 0–44)
AST: 42 U/L — ABNORMAL HIGH (ref 15–41)
Albumin: 2.8 g/dL — ABNORMAL LOW (ref 3.5–5.0)
Alkaline Phosphatase: 54 U/L (ref 38–126)
Anion gap: 8 (ref 5–15)
BUN: 27 mg/dL — ABNORMAL HIGH (ref 8–23)
CO2: 26 mmol/L (ref 22–32)
Calcium: 9 mg/dL (ref 8.9–10.3)
Chloride: 101 mmol/L (ref 98–111)
Creatinine, Ser: 1.83 mg/dL — ABNORMAL HIGH (ref 0.44–1.00)
GFR, Estimated: 27 mL/min — ABNORMAL LOW (ref >60)
Glucose, Bld: 221 mg/dL — ABNORMAL HIGH (ref 70–99)
Potassium: 4.4 mmol/L (ref 3.5–5.1)
Sodium: 135 mmol/L (ref 135–145)
Total Bilirubin: 1.1 mg/dL (ref 0.0–1.2)
Total Protein: 7 g/dL (ref 6.5–8.1)

## 2023-09-29 LAB — RESP PANEL BY RT-PCR (RSV, FLU A&B, COVID)  RVPGX2
Influenza A by PCR: NEGATIVE
Influenza B by PCR: NEGATIVE
Resp Syncytial Virus by PCR: NEGATIVE
SARS Coronavirus 2 by RT PCR: NEGATIVE

## 2023-09-29 LAB — BRAIN NATRIURETIC PEPTIDE: B Natriuretic Peptide: 784 pg/mL — ABNORMAL HIGH (ref 0.0–100.0)

## 2023-09-29 LAB — TROPONIN I (HIGH SENSITIVITY): Troponin I (High Sensitivity): 31 ng/L — ABNORMAL HIGH (ref <18)

## 2023-09-29 LAB — CBG MONITORING, ED: Glucose-Capillary: 265 mg/dL — ABNORMAL HIGH (ref 70–99)

## 2023-09-29 MED ORDER — ASPIRIN 81 MG PO TBEC
81.0000 mg | DELAYED_RELEASE_TABLET | ORAL | Status: DC
Start: 2023-09-30 — End: 2023-10-15
  Administered 2023-09-30 – 2023-10-14 (×5): 81 mg via ORAL
  Filled 2023-09-29 (×5): qty 1

## 2023-09-29 MED ORDER — ACETAMINOPHEN 650 MG RE SUPP: 650.0000 mg | Freq: Four times a day (QID) | RECTAL | Status: DC | PRN

## 2023-09-29 MED ORDER — HYDRALAZINE HCL 25 MG PO TABS
50.0000 mg | ORAL_TABLET | Freq: Four times a day (QID) | ORAL | Status: DC
  Administered 2023-09-30 (×4): 50 mg via ORAL
  Filled 2023-09-29 (×8): qty 2

## 2023-09-29 MED ORDER — INSULIN ASPART 100 UNIT/ML IJ SOLN
0.0000 [IU] | Freq: Three times a day (TID) | INTRAMUSCULAR | Status: DC
  Administered 2023-09-30: 11 [IU] via SUBCUTANEOUS
  Filled 2023-09-29: qty 1

## 2023-09-29 MED ORDER — HYDROCODONE-ACETAMINOPHEN 5-325 MG PO TABS
1.0000 | ORAL_TABLET | ORAL | Status: DC | PRN
  Administered 2023-09-29: 1 via ORAL
  Administered 2023-09-30: 2 via ORAL
  Administered 2023-09-30: 1 via ORAL
  Administered 2023-10-03 – 2023-10-06 (×4): 2 via ORAL
  Administered 2023-10-08 – 2023-10-10 (×5): 1 via ORAL
  Administered 2023-10-11 – 2023-10-12 (×3): 2 via ORAL
  Filled 2023-09-29: qty 2
  Filled 2023-09-29: qty 1
  Filled 2023-09-29: qty 2
  Filled 2023-09-29 (×2): qty 1
  Filled 2023-09-29 (×3): qty 2
  Filled 2023-09-29 (×2): qty 1
  Filled 2023-09-29 (×3): qty 2
  Filled 2023-09-29 (×2): qty 1

## 2023-09-29 MED ORDER — HYDRALAZINE HCL 20 MG/ML IJ SOLN
10.0000 mg | Freq: Four times a day (QID) | INTRAMUSCULAR | Status: DC | PRN
  Administered 2023-09-29 – 2023-09-30 (×3): 10 mg via INTRAVENOUS
  Filled 2023-09-29 (×4): qty 1

## 2023-09-29 MED ORDER — IPRATROPIUM-ALBUTEROL 0.5-2.5 (3) MG/3ML IN SOLN
3.0000 mL | Freq: Four times a day (QID) | RESPIRATORY_TRACT | Status: DC
  Administered 2023-09-30 (×2): 3 mL via RESPIRATORY_TRACT
  Filled 2023-09-29 (×3): qty 3

## 2023-09-29 MED ORDER — FUROSEMIDE 10 MG/ML IJ SOLN
60.0000 mg | Freq: Once | INTRAMUSCULAR | Status: AC
  Administered 2023-09-29: 60 mg via INTRAVENOUS
  Filled 2023-09-29: qty 8

## 2023-09-29 MED ORDER — LORAZEPAM 0.5 MG PO TABS
0.5000 mg | ORAL_TABLET | Freq: Every day | ORAL | Status: DC | PRN
  Administered 2023-09-29 – 2023-10-11 (×9): 0.5 mg via ORAL
  Filled 2023-09-29 (×9): qty 1

## 2023-09-29 MED ORDER — ENOXAPARIN SODIUM 40 MG/0.4ML IJ SOSY: 40.0000 mg | PREFILLED_SYRINGE | INTRAMUSCULAR | Status: DC

## 2023-09-29 MED ORDER — MORPHINE SULFATE (PF) 2 MG/ML IV SOLN
2.0000 mg | INTRAVENOUS | Status: DC | PRN
  Administered 2023-09-30: 2 mg via INTRAVENOUS
  Filled 2023-09-29 (×2): qty 1

## 2023-09-29 MED ORDER — INSULIN NPH (HUMAN) (ISOPHANE) 100 UNIT/ML ~~LOC~~ SUSP
15.0000 [IU] | Freq: Two times a day (BID) | SUBCUTANEOUS | Status: DC
  Filled 2023-09-29 (×2): qty 10

## 2023-09-29 MED ORDER — ACETAMINOPHEN 325 MG PO TABS
650.0000 mg | ORAL_TABLET | Freq: Four times a day (QID) | ORAL | Status: DC | PRN
  Administered 2023-10-01 – 2023-10-05 (×4): 650 mg via ORAL
  Filled 2023-09-29 (×4): qty 2

## 2023-09-29 MED ORDER — SPIRONOLACTONE 25 MG PO TABS
25.0000 mg | ORAL_TABLET | ORAL | Status: DC
  Administered 2023-09-30: 25 mg via ORAL
  Filled 2023-09-29: qty 1

## 2023-09-29 MED ORDER — CLONIDINE HCL 0.1 MG PO TABS: 0.1000 mg | ORAL_TABLET | Freq: Two times a day (BID) | ORAL | Status: DC

## 2023-09-29 MED ORDER — INSULIN ASPART 100 UNIT/ML IJ SOLN
0.0000 [IU] | Freq: Every day | INTRAMUSCULAR | Status: DC
  Administered 2023-09-29: 3 [IU] via SUBCUTANEOUS
  Filled 2023-09-29: qty 1

## 2023-09-29 MED ORDER — NEBIVOLOL HCL 10 MG PO TABS
10.0000 mg | ORAL_TABLET | Freq: Every day | ORAL | Status: DC
  Administered 2023-09-30 – 2023-10-02 (×3): 10 mg via ORAL
  Filled 2023-09-29 (×4): qty 1

## 2023-09-29 MED ORDER — ONDANSETRON HCL 4 MG PO TABS
4.0000 mg | ORAL_TABLET | Freq: Four times a day (QID) | ORAL | Status: DC | PRN
  Administered 2023-09-29 – 2023-10-09 (×3): 4 mg via ORAL
  Filled 2023-09-29 (×3): qty 1

## 2023-09-29 MED ORDER — BENZONATATE 100 MG PO CAPS
200.0000 mg | ORAL_CAPSULE | Freq: Three times a day (TID) | ORAL | Status: DC | PRN
  Administered 2023-09-29 – 2023-10-05 (×4): 200 mg via ORAL
  Filled 2023-09-29 (×4): qty 2

## 2023-09-29 MED ORDER — FUROSEMIDE 10 MG/ML IJ SOLN
40.0000 mg | Freq: Two times a day (BID) | INTRAMUSCULAR | Status: DC
  Administered 2023-09-30 – 2023-10-03 (×7): 40 mg via INTRAVENOUS
  Filled 2023-09-29 (×7): qty 4

## 2023-09-29 MED ORDER — IRBESARTAN 150 MG PO TABS
150.0000 mg | ORAL_TABLET | Freq: Every day | ORAL | Status: DC
  Administered 2023-09-30: 150 mg via ORAL
  Filled 2023-09-29 (×2): qty 1

## 2023-09-29 MED ORDER — ASPIRIN 81 MG PO TBEC: 81.0000 mg | DELAYED_RELEASE_TABLET | ORAL | Status: DC

## 2023-09-29 MED ORDER — ONDANSETRON HCL 4 MG/2ML IJ SOLN: 4.0000 mg | Freq: Four times a day (QID) | INTRAMUSCULAR | Status: DC | PRN

## 2023-09-29 MED ORDER — ROSUVASTATIN CALCIUM 10 MG PO TABS
20.0000 mg | ORAL_TABLET | Freq: Every day | ORAL | Status: DC
  Administered 2023-09-30 – 2023-10-15 (×16): 20 mg via ORAL
  Filled 2023-09-29 (×16): qty 2
  Filled 2023-09-29: qty 1

## 2023-09-29 NOTE — ED Notes (Signed)
 Pt assisted with staff x2 to stretcher from bs commode pt lower extremities noted to be edematous pt reports unable to ambulate without assistance

## 2023-09-29 NOTE — ED Notes (Signed)
 Pt assisted with wwalker to bs commode pt has small amount of soft brown stool pt states "Have you ever had to go to the bathroom bad?!"

## 2023-09-29 NOTE — ED Notes (Signed)
 Pt states "I haven't eaten all day I'm diabetic I have to have food"

## 2023-09-29 NOTE — H&P (Signed)
 History and Physical    Patient: Alexis Farmer VHQ:[id number] DOB: [birth year] DOA: 09/29/2023 DOS: the patient was seen and examined on 09/29/2023 PCP: Valli Gaw, MD  Patient coming from: Home  Chief Complaint:  Chief Complaint  Patient presents with   Shortness of Breath    HPI: Alexis Farmer is a 83 y.o. female with medical history significant for monoclonal B-cell lymphoma, stage IIIb CKD, DM, HTN, HFpEF, chronic pain, obesity, immobility , hospitalized in February with a UTI, and in November with RSV who presents by EMS with a 2-week history of shortness of breath.  She states since her RSV infection her breathing never got quite right however over the past few days she has had worsening shortness of breath, with orthopnea and lower extremity edema. ED course and data review: SBP in the 200s HB at 11.6 and platelets 670. Creatinine slightly above baseline at 1.83 Glucose 221 EKG, personally viewed and interpreted showing sinus at 71 with LVH and no acute ST-T wave changes. Chest x-ray with findings suggestive of CHF Patient given IV Lasix 60 mg Hospitalist consulted for admission.   Review of Systems: As mentioned in the history of present illness. All other systems reviewed and are negative.  Past Medical History:  Diagnosis Date   Anxiety 09/13/2015   Arthritis    Atypical chest pain 01/15/2021   Blackhead 10/24/2021   Bradycardia 07/17/2016   Formatting of this note might be different from the original.  Last Assessment & Plan:   Found to be bradycardic at outside hospital. They stopped metoprolol and verapamil and heart rate has been normal since then. Echo appears to have been reassuring at the outside facility. Appears to be doing much better with regards to this. She'll continue to monitor for recurrent symptoms. 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  Last Assessment  & Plan:   Continues to have issues with this.  She is following with a specialist and it sounds as though they are planning on an MRI.  Benign exam today.   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 Rudean Corrente of this note might be different from the original.  Dr. Zelda Hickman      Last Assessment & Plan:   Recheck kidney function today.   Constipation 11/17/2019   Cough 01/25/2016   Formatting of this note might be different from the original.  Last Assessment & Plan:   Minimal nighttime cough. Improves when she washes her pillows consistently. Suspect allergies contributing. She'll monitor.   Diabetes mellitus without complication (HCC)    one elevated reading/ no treatment   Disorder of rotator cuff 06/24/2018   Diverticulitis    Dysuria 03/22/2017   Formatting of this note might be different from the original.  Last Assessment & Plan:   Symptoms concerning for UTI.  Will check urinalysis.   Edema of foot 04/08/2014   Formatting of this note might be different from the original.  Last Assessment & Plan:   Chronic pedal edema. Suspect venous insufficiency. No orthopnea or shortness of breath. Advised elevation of her legs. Consider compression stockings in the future.   Elevated troponin 12/15/2020   Essential hypertension 04/08/2014   Formatting of this note might be different from the original.  Last Assessment & Plan:   Well-controlled on recheck.  Continue current regimen.   Fall 03/26/2018  Gastroenteritis 08/12/2021   Gastrointestinal hemorrhage 08/03/2015   GI bleed    High cholesterol    History of blood transfusion    Hyperglycemia due to type 2 diabetes mellitus (HCC) 03/11/2015   Hypertension    Hypertensive urgency 12/15/2020   Low back pain 04/08/2014   Morbid obesity (HCC) 04/08/2014   Formatting of this note might be different from the original.  Last Assessment & Plan:   Weight is stable. Discussed diet and exercise at length.  Encouraged whatever exercise she can do. Given diet information.   Palpitations 12/15/2020   Peripheral edema 12/22/2019   Postmenopausal bleeding 07/17/2016   Formatting of this note might be different from the original.  Last Assessment & Plan:   Recent D&C. Following with gynecology. Benign findings. Monitor for recurrence.   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  Last Assessment & Plan:   Continues to have issues with this.  She is following with a specialist and it sounds as though they are planning on an MRI.  Benign exam today.   Recurrent falls 08/13/2019   Renal insufficiency    Skin cyst 10/24/2021   Stage 3a chronic kidney disease (HCC) 01/15/2021   Swelling of lower leg 07/19/2016   Vertigo 10/13/2015   Formatting of this note might be different from the original.  Last Assessment & Plan:   No recurrence. Discussed that meclizine is an as needed medication and that she does not need to take this daily. She will continue to monitor.   Past Surgical History:  Procedure Laterality Date   APPENDECTOMY     CHOLECYSTECTOMY     ECTOPIC PREGNANCY SURGERY     EYE SURGERY     bilateral cataracts   EYE SURGERY     02/11/2019 repair hole in right eye    gallbladder      HYSTEROSCOPY WITH D & C N/A 10/26/2016   Procedure: DILATATION AND CURETTAGE /HYSTEROSCOPY;  Surgeon: Prescilla Brod, MD;  Location: [facility];  Service: Gynecology;  Laterality: N/A;   HYSTEROSCOPY WITH D & C N/A 07/07/2018   Procedure: DILATATION AND CURETTAGE /HYSTEROSCOPY;  Surgeon: Prescilla Brod, MD;  Location: [facility];  Service: Gynecology;  Laterality: N/A;   THYROIDECTOMY, PARTIAL     Social History:  reports that she has never smoked. She has never used smokeless tobacco. She reports that she does not drink alcohol and does not use drugs.  Allergies   Allergen Reactions   Celexa [Citalopram]     Diarrhea upset stomach    Jardiance [Empagliflozin] Other (See Comments)    Reaction not recalled   Norvasc [Amlodipine]     Leg edema   Tape Other (See Comments)    Leaves the skin "raw" if left on for a period of time- tolerates paper tape   Penicillin V Rash   Penicillin V Potassium Rash   Penicillins Rash    Has patient had a PCN reaction causing immediate rash, facial/tongue/throat swelling, SOB or lightheadedness with hypotension: Yes Has patient had a PCN reaction causing severe rash involving mucus membranes or skin necrosis: No Has patient had a PCN reaction that required hospitalization No Has patient had a PCN reaction occurring within the last 10 years: Yes If all of the above answers are "NO", then may proceed with Cephalosporin use.     Family History  Problem Relation Age of  Onset   Diabetes Mother    Hypertension Mother    Stroke Mother    Diabetes Other    Healthy Father    Diabetes Sister    Heart disease Sister     Prior to Admission medications   Medication Sig Start Date End Date Taking? Authorizing Provider  acetaminophen (TYLENOL) 500 MG tablet Take 500 mg by mouth every 6 (six) hours as needed for mild pain or headache.    [provider]  Ascorbic Acid (VITAMIN C PO) Take 1 tablet by mouth daily.    [provider]  aspirin EC 81 MG tablet Take 81 mg by mouth 2 (two) times a week. Swallow whole.    [provider]  benzonatate (TESSALON) 200 MG capsule Take 200 mg by mouth 3 (three) times daily as needed for cough.    [provider]  blood glucose meter kit and supplies KIT Accu chek, Dx code E11.65, check 3 times daily 07/26/17   Kent Pear, MD  Blood Pressure KIT 1 Device by Does not apply route daily. 10/24/21   McLean-Scocuzza, Karon Packer, MD  budesonide (PULMICORT) 0.5 MG/2ML nebulizer solution Take 0.5 mg by nebulization daily. 07/26/23 08/03/23  [provider]  cloNIDine (CATAPRES) 0.1 MG tablet Take 1 tablet (0.1 mg total) by mouth 2 (two) times daily. 08/30/23 09/29/23  Rica Chalet, MD  Dextromethorphan-guaiFENesin (MUCUS RELIEF DM PO) Take by mouth. Liquid cough syrup    [provider]  glucose blood (ACCU-CHEK GUIDE) test strip USE AS INSTRUCTED 3 TIMES DAILY 09/06/22   Walsh, Tanya, MD  guaiFENesin (SCOT-TUSSIN EXPECTORANT) 100 MG/5ML liquid Take 20 mLs by mouth 3 (three) times daily as needed for cough. 07/26/23   [provider]  hydrALAZINE (APRESOLINE) 100 MG tablet TAKE 1 TABLET (100 MG TOTAL) BY MOUTH 4 (FOUR) TIMES DAILY. Must keep upcoming appointment on 10/21/23 for future refills. 09/12/23   Gollan, Timothy J, MD  insulin NPH Human (NOVOLIN N) 100 UNIT/ML injection Inject 15-20 Units into the skin 2 (two) times daily before a meal. Inject 20 units in the morning an 15 units in the evening. 06/08/19   [provider]  insulin regular (NOVOLIN R) 100 units/mL injection Inject 0-0.08 mLs (0-8 Units total) into the skin 3 (three) times daily before meals. 10/17/20   Gwyndolyn Lerner, MD  Insulin Syringe-Needle U-100 (BD INSULIN SYRINGE U/F) 30G X 1/2" 0.5 ML MISC USE UP TO 5X PER DAY WITH INSULIN 2X (NPH) AND 3X (REGULAR) 12/18/22   Valli Gaw, MD  ipratropium-albuterol (DUONEB) 0.5-2.5 (3) MG/3ML SOLN Take 3 mLs by nebulization 4 (four) times daily. Take scheduled 4 times daily until wheezing and shortness of breath from RSV improves. Then take as needed.    [provider]  LORazepam (ATIVAN) 1 MG tablet Take 0.5 tablets (0.5 mg total) by mouth daily as needed for anxiety. Inform pt 0.5 dose on backorder has to cut this in 1/2 dose 09/03/23   Valli Gaw, MD  Multiple Vitamins-Minerals (CENTRUM SILVER 50+WOMEN) TABS Take 1 tablet by mouth daily with breakfast.    [provider]  nebivolol (BYSTOLIC) 10 MG tablet TAKE 1 TABLET BY MOUTH EVERY DAY 09/24/23   Gollan, Timothy J, MD  nystatin  (MYCOSTATIN/NYSTOP) powder Apply topically 2 (two) times daily. Under abdomen 02/04/23   Valli Gaw, MD  polyethylene glycol powder (GLYCOLAX/MIRALAX) 17 GM/SCOOP powder Take 17 g by mouth daily as needed for moderate constipation or severe constipation. Can take up to  2x per day prn. Mix with 8 ounces of liquid 03/21/22   McLean-Scocuzza, Karon Packer, MD  rosuvastatin (CRESTOR) 20 MG tablet TAKE 1 TABLET BY MOUTH EVERY DAY 03/21/22   McLean-Scocuzza, Karon Packer, MD  spironolactone (ALDACTONE) 25 MG tablet Take 25 mg by mouth 3 (three) times a week. Monday Wednesday and friday    [provider]  torsemide (DEMADEX) 10 MG tablet Take 1 tablet (10 mg total) by mouth daily. 07/11/21   McLean-Scocuzza, Karon Packer, MD  valsartan (DIOVAN) 160 MG tablet TAKE 1 TABLET BY MOUTH EVERY DAY 07/16/23   Devorah Fonder, MD    Physical Exam: Vitals:   09/29/23 2000 09/29/23 2030 09/29/23 2100 09/29/23 2130  BP: (!) 207/61 (!) 187/111 (!) 212/67 (!) 217/69  Pulse: 66 73 69 72  Resp: 16 (!) 31 19 19   Temp:      SpO2: 92% 92% 98% 98%  Height:       Physical Exam Vitals and nursing note reviewed.  Constitutional:      General: She is not in acute distress. HENT:     Head: Normocephalic and atraumatic.  Cardiovascular:     Rate and Rhythm: Normal rate and regular rhythm.     Heart sounds: Normal heart sounds.  Pulmonary:     Effort: Tachypnea present.     Breath sounds: Rales present.  Abdominal:     Palpations: Abdomen is soft.     Tenderness: There is no abdominal tenderness.  Musculoskeletal:     Right lower leg: 3+ Edema present.     Left lower leg: 3+ Edema present.  Neurological:     Mental Status: Mental status is at baseline.     Labs on Admission: I have personally reviewed following labs and imaging studies  CBC: Recent Labs  Lab 09/29/23 1836  WBC 7.8  HGB 11.6*  HCT 35.9*  MCV 89.5  PLT 670*   Basic Metabolic Panel: Recent Labs  Lab 09/29/23 1836  NA 135  K 4.4  CL  101  CO2 26  GLUCOSE 221*  BUN 27*  CREATININE 1.83*  CALCIUM 9.0   GFR: CrCl cannot be calculated (Unknown ideal weight.). Liver Function Tests: Recent Labs  Lab 09/29/23 1836  AST 42*  ALT 24  ALKPHOS 54  BILITOT 1.1  PROT 7.0  ALBUMIN 2.8*   No results for input(s): "LIPASE", "AMYLASE" in the last 168 hours. No results for input(s): "AMMONIA" in the last 168 hours. Coagulation Profile: No results for input(s): "INR", "PROTIME" in the last 168 hours. Cardiac Enzymes: No results for input(s): "CKTOTAL", "CKMB", "CKMBINDEX", "TROPONINI" in the last 168 hours. BNP (last 3 results) No results for input(s): "PROBNP" in the last 8760 hours. HbA1C: No results for input(s): "HGBA1C" in the last 72 hours. CBG: No results for input(s): "GLUCAP" in the last 168 hours. Lipid Profile: No results for input(s): "CHOL", "HDL", "LDLCALC", "TRIG", "CHOLHDL", "LDLDIRECT" in the last 72 hours. Thyroid Function Tests: No results for input(s): "TSH", "T4TOTAL", "FREET4", "T3FREE", "THYROIDAB" in the last 72 hours. Anemia Panel: No results for input(s): "VITAMINB12", "FOLATE", "FERRITIN", "TIBC", "IRON", "RETICCTPCT" in the last 72 hours. Urine analysis:    Component Value Date/Time   COLORURINE YELLOW (A) 08/03/2023 1828   APPEARANCEUR HAZY (A) 08/03/2023 1828   APPEARANCEUR Clear 10/22/2018 0844   LABSPEC 1.020 08/03/2023 1828   PHURINE 5.0 08/03/2023 1828   GLUCOSEU >=500 (A) 08/03/2023 1828   HGBUR SMALL (A) 08/03/2023 1828   BILIRUBINUR NEGATIVE 08/03/2023 1828  BILIRUBINUR Negative 10/22/2018 0844   KETONESUR NEGATIVE 08/03/2023 1828   PROTEINUR >=300 (A) 08/03/2023 1828   UROBILINOGEN 0.2 03/09/2020 1022   NITRITE NEGATIVE 08/03/2023 1828   LEUKOCYTESUR TRACE (A) 08/03/2023 1828    Radiological Exams on Admission: DG Chest Port 1 View Result Date: 09/29/2023 CLINICAL DATA:  sob EXAM: PORTABLE CHEST - 1 VIEW COMPARISON:  08/04/2023. FINDINGS: Cardiac silhouette is  prominent. There is pulmonary interstitial prominence with vascular congestion. No focal consolidation. No pneumothorax. Small pleural effusions. Calcified aorta. Thoracic degenerative changes. IMPRESSION: Findings suggest CHF. Electronically Signed   By: Sydell Eva M.D.   On: 09/29/2023 19:25     Data Reviewed: Relevant notes from primary care and specialist visits, past discharge summaries as available in EHR, including Care Everywhere. Prior diagnostic testing as pertinent to current admission diagnoses Updated medications and problem lists for reconciliation ED course, including vitals, labs, imaging, treatment and response to treatment Triage notes, nursing and pharmacy notes and ED provider's notes Notable results as noted in HPI   Assessment and Plan: * Acute on chronic heart failure with preserved ejection fraction (HFpEF) (HCC) Hypertensive urgency Elevated troponin Elevated troponin likely related to demand ischemia from CHF and elevated BP  IV Lasix to replace home torsemide Continue home GDMT, with valsartan, spironolactone, Nebivolol, hydralazine Additional BP control with IV hydralazine if needed  Uncontrolled type 2 diabetes mellitus with hyperglycemia, with long-term current use of insulin (HCC) Blood sugar over 200 Continue home basal insulin.  Sliding scale coverage  Acute renal failure superimposed on stage 3b chronic kidney disease (HCC) Creatinine 1.88 up from baseline of 1.4 Monitor for worsening renal function in view of IV diuretic therapy  Obesity, Class III, BMI 40-49.9 (morbid obesity) (HCC) Complicating factor to overall prognosis and care  HTN Continue hydralazine, clonidine, valsartan, nebivolol   Mixed hyperlipidemia Continue rosuvastatin       DVT prophylaxis: Lovenox  Consults: none  Advance Care Planning:   Code Status: Prior   Family Communication: none  Disposition Plan: Back to previous home environment  Severity of  Illness: The appropriate patient status for this patient is OBSERVATION. Observation status is judged to be reasonable and necessary in order to provide the required intensity of service to ensure the patient's safety. The patient's presenting symptoms, physical exam findings, and initial radiographic and laboratory data in the context of their medical condition is felt to place them at decreased risk for further clinical deterioration. Furthermore, it is anticipated that the patient will be medically stable for discharge from the hospital within 2 midnights of admission.   Author: Lanetta Pion, MD 09/29/2023 9:38 PM  For on call review [email or web address].

## 2023-09-29 NOTE — ED Notes (Signed)
 Pt request pure wick dtr @ bs request info on how to obtained home pure wick pt reports being cold pt noted to have 6 blankets on body room temp is set to 23f pt speaking in full clear sentences appears agitated states "I'll just stay in my brief it's wet but I'll just stay in it" pt advise information regarding possible skin break down r/t urine on skin  with stayrated brief and pt agrees to have saturated brief removed and pericare are provided

## 2023-09-29 NOTE — ED Notes (Signed)
 Pt request bs commode and walker pt assissted to bs commode with walker pt has small amount

## 2023-09-29 NOTE — ED Triage Notes (Addendum)
 BIBEMS, coming from home. C/o SOB that started 2 weeks. 96% on RA, placed on 2L [place] for comfort. GCS 15. PMH: DM, CHF, CKD, HTN. States she did not remember to take her water pill today becasuse she didn't feel good. Pt has bilateral pitting edema in both legs, weeping

## 2023-09-29 NOTE — ED Notes (Signed)
 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

## 2023-09-29 NOTE — Assessment & Plan Note (Signed)
Blood sugar over 200 Continue home basal insulin Sliding scale coverage

## 2023-09-29 NOTE — Assessment & Plan Note (Signed)
 Creatinine 1.88 up from baseline of 1.4 Monitor for worsening renal function in view of IV diuretic therapy

## 2023-09-29 NOTE — Assessment & Plan Note (Signed)
 Complicating factor to overall prognosis and care

## 2023-09-29 NOTE — Assessment & Plan Note (Addendum)
 Hypertensive urgency Elevated troponin Elevated troponin likely related to demand ischemia from CHF and elevated BP  IV Lasix to replace home torsemide Continue home GDMT, with valsartan, spironolactone, Nebivolol, hydralazine Additional BP control with IV hydralazine if needed

## 2023-09-29 NOTE — ED Provider Notes (Signed)
 [facility] Provider Note    Event Date/Time   First MD Initiated Contact with Patient 09/29/23 1823     (approximate)   History   Shortness of Breath   HPI  Alexis Farmer is a 83 y.o. female who presents to the emergency department today because of concerns for shortness of breath.  Patient states that since she had had RSV a few months ago she feels like she has never fully recovered.  However over the past few days her breathing has gotten worse.  She finds it particularly hard to lie flat.  She denies any associated chest pain.  Denies any fevers.  She has noticed swelling in her lower extremities.  Patient has a history of heart failure and has missed recent dose of her Lasix.     Physical Exam   Triage Vital Signs: ED Triage Vitals  Encounter Vitals Group     BP 09/29/23 1830 (!) 167/80     Systolic BP Percentile --      Diastolic BP Percentile --      Pulse Rate 09/29/23 1830 73     Resp 09/29/23 1830 16     Temp 09/29/23 1830 98.4 F (36.9 C)     Temp src --      SpO2 09/29/23 1830 95 %     Weight --      Height 09/29/23 1832 5\' 3"  (1.6 m)     Head Circumference --      Peak Flow --      Pain Score 09/29/23 1831 7     Pain Loc --      Pain Education --      Exclude from Growth Chart --     Most recent vital signs: Vitals:   09/29/23 1830  BP: (!) 167/80  Pulse: 73  Resp: 16  Temp: 98.4 F (36.9 C)  SpO2: 95%   General: Awake, alert, oriented. CV:  Good peripheral perfusion. Regular rate and rhythm. Resp:  Increased work of breathing, poor air movement. Abd:  No distention.  Other:  Bilateral pitting edema in lower extremities.    ED Results / Procedures / Treatments   Labs (all labs ordered are listed, but only abnormal results are displayed) Labs Reviewed  CBC - Abnormal; Notable for the following components:      Result Value   Hemoglobin 11.6 (*)    HCT 35.9 (*)    Platelets 670 (*)    All other components  within normal limits  RESP PANEL BY RT-PCR (RSV, FLU A&B, COVID)  [id number]  COMPREHENSIVE METABOLIC PANEL WITH GFR     EKG  I, Marylynn Soho, attending physician, personally viewed and interpreted this EKG  EKG Time: 1837 Rate: 71 Rhythm: sinus rhythm Axis: left axis deviation Intervals: qtc 496 QRS: LVH ST changes: no st elevation Impression: abnormal ekg    RADIOLOGY I independently interpreted and visualized the CXR. My interpretation: cardiomegaly, no pneumonia Radiology interpretation:  IMPRESSION:  Findings suggest CHF.      PROCEDURES:  Critical Care performed: Yes  CRITICAL CARE Performed by: Marylynn Soho   Total critical care time: 30 minutes  Critical care time was exclusive of separately billable procedures and treating other patients.  Critical care was necessary to treat or prevent imminent or life-threatening deterioration.  Critical care was time spent personally by me on the following activities: development of treatment plan with patient and/or surrogate as well as nursing, discussions with consultants, evaluation of  patient's response to treatment, examination of patient, obtaining history from patient or surrogate, ordering and performing treatments and interventions, ordering and review of laboratory studies, ordering and review of radiographic studies, pulse oximetry and re-evaluation of patient's condition.   Procedures    MEDICATIONS ORDERED IN ED: Medications - No data to display   IMPRESSION / MDM / ASSESSMENT AND PLAN / ED COURSE  I reviewed the triage vital signs and the nursing notes.                              Differential diagnosis includes, but is not limited to, pneumonia, CHF, COPD, ACS  Patient's presentation is most consistent with acute presentation with potential threat to life or bodily function.   The patient is on the cardiac monitor to evaluate for evidence of arrhythmia and/or significant heart rate  changes.  Patient presented to the emergency department today because of concerns for increasing shortness of breath.  Patient does endorse orthopnea.  On exam patient has significant bilateral lower extremity pitting edema.  Chest x-ray is concerning for CHF and BNP is elevated and blood work.  There is also slight elevation of troponin, at this time I have lower concern for ACS and think likely secondary to CHF.  Patient was given IV Lasix here in the emergency department.  Discussed with Dr. Vallarie Gauze with the hospitalist service who will evaluate for admission.      FINAL CLINICAL IMPRESSION(S) / ED DIAGNOSES   Final diagnoses:  Shortness of breath  Congestive heart failure, unspecified HF chronicity, unspecified heart failure type [facility])     Note:  This document was prepared using Dragon voice recognition software and may include unintentional dictation errors.    Marylynn Soho, MD 09/29/23 2329

## 2023-09-30 ENCOUNTER — Encounter: Payer: Self-pay | Admitting: Internal Medicine

## 2023-09-30 ENCOUNTER — Observation Stay (HOSPITAL_COMMUNITY)
Admit: 2023-09-30 | Discharge: 2023-09-30 | Disposition: A | Discharge status: Home / Self Care | Attending: Internal Medicine | Admitting: Internal Medicine

## 2023-09-30 DIAGNOSIS — E861 Hypovolemia: Secondary | ICD-10-CM | POA: Diagnosis not present

## 2023-09-30 DIAGNOSIS — G8929 Other chronic pain: Secondary | ICD-10-CM | POA: Diagnosis present

## 2023-09-30 DIAGNOSIS — F419 Anxiety disorder, unspecified: Secondary | ICD-10-CM | POA: Diagnosis present

## 2023-09-30 DIAGNOSIS — Z79899 Other long term (current) drug therapy: Secondary | ICD-10-CM | POA: Diagnosis not present

## 2023-09-30 DIAGNOSIS — I2721 Secondary pulmonary arterial hypertension: Secondary | ICD-10-CM | POA: Diagnosis present

## 2023-09-30 DIAGNOSIS — N179 Acute kidney failure, unspecified: Secondary | ICD-10-CM | POA: Diagnosis present

## 2023-09-30 DIAGNOSIS — I10 Essential (primary) hypertension: Secondary | ICD-10-CM | POA: Diagnosis not present

## 2023-09-30 DIAGNOSIS — E66813 Obesity, class 3: Secondary | ICD-10-CM | POA: Diagnosis present

## 2023-09-30 DIAGNOSIS — K5900 Constipation, unspecified: Secondary | ICD-10-CM | POA: Diagnosis not present

## 2023-09-30 DIAGNOSIS — Z1152 Encounter for screening for COVID-19: Secondary | ICD-10-CM | POA: Diagnosis not present

## 2023-09-30 DIAGNOSIS — I959 Hypotension, unspecified: Secondary | ICD-10-CM | POA: Diagnosis not present

## 2023-09-30 DIAGNOSIS — I5032 Chronic diastolic (congestive) heart failure: Secondary | ICD-10-CM | POA: Diagnosis not present

## 2023-09-30 DIAGNOSIS — I5033 Acute on chronic diastolic (congestive) heart failure: Secondary | ICD-10-CM | POA: Diagnosis present

## 2023-09-30 DIAGNOSIS — Z8249 Family history of ischemic heart disease and other diseases of the circulatory system: Secondary | ICD-10-CM | POA: Diagnosis not present

## 2023-09-30 DIAGNOSIS — I130 Hypertensive heart and chronic kidney disease with heart failure and stage 1 through stage 4 chronic kidney disease, or unspecified chronic kidney disease: Secondary | ICD-10-CM | POA: Diagnosis present

## 2023-09-30 DIAGNOSIS — Z8572 Personal history of non-Hodgkin lymphomas: Secondary | ICD-10-CM | POA: Diagnosis not present

## 2023-09-30 DIAGNOSIS — E1151 Type 2 diabetes mellitus with diabetic peripheral angiopathy without gangrene: Secondary | ICD-10-CM | POA: Diagnosis present

## 2023-09-30 DIAGNOSIS — E782 Mixed hyperlipidemia: Secondary | ICD-10-CM | POA: Diagnosis present

## 2023-09-30 DIAGNOSIS — Z794 Long term (current) use of insulin: Secondary | ICD-10-CM | POA: Diagnosis not present

## 2023-09-30 DIAGNOSIS — R0602 Shortness of breath: Secondary | ICD-10-CM | POA: Diagnosis present

## 2023-09-30 DIAGNOSIS — E1122 Type 2 diabetes mellitus with diabetic chronic kidney disease: Secondary | ICD-10-CM | POA: Diagnosis present

## 2023-09-30 DIAGNOSIS — I2510 Atherosclerotic heart disease of native coronary artery without angina pectoris: Secondary | ICD-10-CM | POA: Diagnosis present

## 2023-09-30 DIAGNOSIS — I160 Hypertensive urgency: Secondary | ICD-10-CM | POA: Diagnosis present

## 2023-09-30 DIAGNOSIS — E1165 Type 2 diabetes mellitus with hyperglycemia: Secondary | ICD-10-CM | POA: Diagnosis present

## 2023-09-30 DIAGNOSIS — E1121 Type 2 diabetes mellitus with diabetic nephropathy: Secondary | ICD-10-CM | POA: Diagnosis not present

## 2023-09-30 DIAGNOSIS — I272 Pulmonary hypertension, unspecified: Secondary | ICD-10-CM | POA: Diagnosis not present

## 2023-09-30 DIAGNOSIS — E876 Hypokalemia: Secondary | ICD-10-CM | POA: Diagnosis present

## 2023-09-30 DIAGNOSIS — N1832 Chronic kidney disease, stage 3b: Secondary | ICD-10-CM | POA: Diagnosis present

## 2023-09-30 LAB — ECHOCARDIOGRAM COMPLETE
AR max vel: 2.62 cm2
AV Area VTI: 2.57 cm2
AV Area mean vel: 2.48 cm2
AV Mean grad: 4 mmHg
AV Peak grad: 8.3 mmHg
Ao pk vel: 1.44 m/s
Area-P 1/2: 2.91 cm2
Height: 63 in
MV VTI: 2.08 cm2
S' Lateral: 1.2 cm
Weight: 4608 [oz_av]

## 2023-09-30 LAB — CBG MONITORING, ED
Glucose-Capillary: 256 mg/dL — ABNORMAL HIGH (ref 70–99)
Glucose-Capillary: 237 mg/dL — ABNORMAL HIGH (ref 70–99)

## 2023-09-30 LAB — GLUCOSE, CAPILLARY
Glucose-Capillary: 166 mg/dL — ABNORMAL HIGH (ref 70–99)
Glucose-Capillary: 151 mg/dL — ABNORMAL HIGH (ref 70–99)
Glucose-Capillary: 123 mg/dL — ABNORMAL HIGH (ref 70–99)

## 2023-09-30 MED ORDER — CLONIDINE HCL 0.1 MG PO TABS
0.1000 mg | ORAL_TABLET | Freq: Two times a day (BID) | ORAL | Status: DC
  Administered 2023-09-30: 0.1 mg via ORAL
  Filled 2023-09-30 (×2): qty 1

## 2023-09-30 MED ORDER — INSULIN GLARGINE-YFGN 100 UNIT/ML ~~LOC~~ SOLN
30.0000 [IU] | Freq: Every day | SUBCUTANEOUS | Status: DC
  Administered 2023-09-30 – 2023-10-01 (×2): 30 [IU] via SUBCUTANEOUS
  Filled 2023-09-30 (×4): qty 0.3

## 2023-09-30 MED ORDER — INSULIN ASPART 100 UNIT/ML IJ SOLN
0.0000 [IU] | Freq: Three times a day (TID) | INTRAMUSCULAR | Status: DC
  Administered 2023-09-30: 3 [IU] via SUBCUTANEOUS
  Administered 2023-09-30 – 2023-10-01 (×2): 5 [IU] via SUBCUTANEOUS
  Administered 2023-10-01: 2 [IU] via SUBCUTANEOUS
  Administered 2023-10-02: 3 [IU] via SUBCUTANEOUS
  Administered 2023-10-03: 5 [IU] via SUBCUTANEOUS
  Administered 2023-10-04: 3 [IU] via SUBCUTANEOUS
  Administered 2023-10-04 (×2): 8 [IU] via SUBCUTANEOUS
  Administered 2023-10-05: 3 [IU] via SUBCUTANEOUS
  Administered 2023-10-05: 5 [IU] via SUBCUTANEOUS
  Administered 2023-10-06: 3 [IU] via SUBCUTANEOUS
  Administered 2023-10-06: 8 [IU] via SUBCUTANEOUS
  Administered 2023-10-07: 5 [IU] via SUBCUTANEOUS
  Administered 2023-10-07: 8 [IU] via SUBCUTANEOUS
  Administered 2023-10-08 (×2): 5 [IU] via SUBCUTANEOUS
  Administered 2023-10-09: 3 [IU] via SUBCUTANEOUS
  Administered 2023-10-09: 5 [IU] via SUBCUTANEOUS
  Administered 2023-10-10 – 2023-10-12 (×3): 8 [IU] via SUBCUTANEOUS
  Administered 2023-10-12 – 2023-10-14 (×4): 3 [IU] via SUBCUTANEOUS
  Administered 2023-10-15: 15 [IU] via SUBCUTANEOUS
  Filled 2023-09-30 (×32): qty 1

## 2023-09-30 MED ORDER — ENOXAPARIN SODIUM 80 MG/0.8ML IJ SOSY
0.5000 mg/kg | PREFILLED_SYRINGE | INTRAMUSCULAR | Status: DC
  Administered 2023-09-30 – 2023-10-07 (×8): 65 mg via SUBCUTANEOUS
  Filled 2023-09-30 (×6): qty 0.8
  Filled 2023-09-30: qty 0.65
  Filled 2023-09-30: qty 0.8

## 2023-09-30 MED ORDER — CLONIDINE HCL 0.1 MG PO TABS
0.1000 mg | ORAL_TABLET | Freq: Two times a day (BID) | ORAL | Status: DC
  Administered 2023-10-01 – 2023-10-02 (×3): 0.1 mg via ORAL
  Filled 2023-09-30 (×3): qty 1

## 2023-09-30 NOTE — Care Management Obs Status (Signed)
 MEDICARE OBSERVATION STATUS NOTIFICATION   Patient Details  Name: Alexis Farmer MRN: [id number] Date of Birth: 1941/05/25   Medicare Observation Status Notification Given:  Rudolph Cost, CMA 09/30/2023, 10:33 AM

## 2023-09-30 NOTE — Progress Notes (Signed)
 Progress Note   Patient: Alexis Farmer ZOX:[id number] DOB: 10-Dec-1940 DOA: 09/29/2023     0 DOS: the patient was seen and examined on 09/30/2023   Brief hospital course:  Alexis Farmer is a 83 y.o. female with medical history significant for monoclonal B-cell lymphoma, stage IIIb CKD, DM, HTN, HFpEF, chronic pain, obesity, immobility , hospitalized in February with a UTI, and in November with RSV who presents by EMS with a 2-week history of shortness of breath.  She states since her RSV infection her breathing never got quite right however over the past few days she has had worsening shortness of breath, with orthopnea and lower extremity edema. ED course and data review: SBP in the 200s HB at 11.6 and platelets 670. Creatinine slightly above baseline at 1.83 Glucose 221 EKG, personally viewed and interpreted showing sinus at 71 with LVH and no acute ST-T wave changes. Chest x-ray with findings suggestive of CHF Patient given IV Lasix 60 mg Hospitalist consulted for admission.      Assessment and Plan:   * Acute on chronic heart failure with preserved ejection fraction (HFpEF) (HCC) Hypertensive urgency Elevated troponin Myocardial injury Elevated troponin likely related to demand ischemia from CHF and elevated BP  Patient denies having any chest pain or shortness of breath Continue IV Lasix  Continue home GDMT, with valsartan, spironolactone, Nebivolol, hydralazine As needed hydralazine for systolic blood pressure greater than    Uncontrolled type 2 diabetes mellitus with hyperglycemia, with long-term current use of insulin (HCC) Blood sugar over 200 Patient on NPH and regular insulin at home ??  Compliance Will start patient on Lantus 30 units daily with sliding scale coverage Maintain consistent carbohydrate diet    Acute renal failure superimposed on stage 3b chronic kidney disease (HCC) Creatinine 1.88 up from baseline of 1.4 Monitor for worsening renal  function in view of IV diuretic therapy Hold spironolactone and Avapro Monitor renal function closely    Obesity, Class III, BMI 40-49.9 (morbid obesity) (HCC) Complicating factor to overall prognosis and care   HTN Uncontrolled and may be rebound since patient was on clonidine but has not been resumed Continue hydralazine, clonidine, nebivolol For heart rate closely         Subjective: Patient is seen and examined at the bedside.  Complains of shortness of breath  Physical Exam: Vitals:   09/30/23 1100 09/30/23 1124 09/30/23 1130 09/30/23 1200  BP: (!) 187/64  (!) 197/81 (!) 198/63  Pulse: 61  (!) 59 69  Resp: (!) 21  14 (!) 25  Temp:  97.8 F (36.6 C)    TempSrc:  Oral    SpO2: 100%  100% 94%  Weight:      Height:       Vitals and nursing note reviewed.  Constitutional:      General: She is not in acute distress. HENT:     Head: Normocephalic and atraumatic.  Cardiovascular:     Rate and Rhythm: Normal rate and regular rhythm.     Heart sounds: Normal heart sounds.  Pulmonary:     Effort: Tachypnea present.     Breath sounds: Rales present.  Abdominal:     Palpations: Abdomen is soft.     Tenderness: There is no abdominal tenderness.  Musculoskeletal:     Right lower leg: 3+ Edema present.     Left lower leg: 3+ Edema present.  Neurological:     Mental Status: Mental status is at baseline.  Data Reviewed: BUN 27, creatinine 1.83, hemoglobin 11.6 Labs reviewed  Family Communication: Plan of care was discussed with patient in detail.  She verbalizes understanding and agrees with the plan.  Disposition: Status is: Observation The patient remains OBS appropriate and will d/c before 2 midnights.  Planned Discharge Destination:  TBD    Time spent: 37 minutes  Author: Read Camel, MD 09/30/2023 1:13 PM  For on call review [email or web address].

## 2023-09-30 NOTE — Progress Notes (Signed)
 Per MD Agbata, vitals recheck post 1 hour:  09/30/23 1620  Assess: MEWS Score  Temp 98.2 F (36.8 C)  BP (!) 133/113  MAP (mmHg) 120  Pulse Rate 66  ECG Heart Rate 60  Resp 13  SpO2 100 %  O2 Device Nasal Cannula  O2 Flow Rate (L/min) 4 L/min  Assess: MEWS Score  MEWS Temp 0  MEWS Systolic 0  MEWS Pulse 0  MEWS RR 1  MEWS LOC 0  MEWS Score 1  MEWS Score Color Green  Assess: SIRS CRITERIA  SIRS Temperature  0  SIRS Respirations  0  SIRS Pulse 0  SIRS WBC 0  SIRS Score Sum  0   Scheduled antihypertensive meds given at 1536 - Clonidine 0.1 mg and hydralazine 50 mg.

## 2023-09-30 NOTE — Progress Notes (Signed)
   09/30/23 1522  Pre-Screen- If "YES" to any of the following, STOP the screen, keep NPO, and place order for SLP eval and treat.   Home diet required thickened liquids No - Proceed  Trach tube present No - Proceed  Radiation to Head/Neck No - Proceed  Patient Readiness- If "YES" to any of the following, WAIT to screen, keep NPO, and place order for SLP eval and treat. May rescreen if clinical improvement WITHIN 24h.    Is patient lethargic or unable to stay alert/awake? No - Proceed  HOB restricted to <30 degrees No - Proceed  NPO for planned procedure No - Proceed  Aspiration Risk Assessment  Is patient oriented to name, place, or year? Yes - Proceed  Able to open mouth, stick out tongue, or smile Yes - Proceed  Able to seal lips Yes - Proceed  Able to move tongue from side to side Yes - Proceed  Face is symmetric Yes - Proceed  Mandatory oral care performed Yes  3 oz Water Challenge  Does the patient stop drinking? No - Proceed  Does the patient cough/choke? No - Proceed  Document PASS or FAIL PASS- Obtain Diet Order

## 2023-09-30 NOTE — ED Notes (Signed)
 Answered the call light, pt advised she is cold, explained to pt her room heat is on and set at 75. Brought pt 2 more blankets.

## 2023-09-30 NOTE — Progress Notes (Signed)
 YELLOW MEWS Implemented:  09/30/23 1519 09/30/23 1524  Assess: MEWS Score  Temp 98.6 F (37 C)  --   BP (!) 207/75 (!) 211/90 (recheck)  MAP (mmHg) 112  --   Pulse Rate 75  --   Resp 20  --   Level of Consciousness  --  Alert  SpO2 100 %  --   O2 Device Nasal Cannula  --   O2 Flow Rate (L/min) 4 L/min  --   Assess: MEWS Score  MEWS Temp 0 0  MEWS Systolic 2 2  MEWS Pulse 0 0  MEWS RR 0 0  MEWS LOC 0 0  MEWS Score 2 2  MEWS Score Color Yellow Yellow  Assess: if the MEWS score is Yellow or Red  Were vital signs accurate and taken at a resting state?  --  Yes  Does the patient meet 2 or more of the SIRS criteria?  --  No  MEWS guidelines implemented   --  Yes, yellow  Treat  MEWS Interventions  --  Considered administering scheduled or prn medications/treatments as ordered  Take Vital Signs  Increase Vital Sign Frequency   --  Yellow: Q2hr x1, continue Q4hrs until patient remains green for 12hrs  Escalate  MEWS: Escalate  --  Yellow: Discuss with charge nurse and consider notifying provider and/or RRT  Notify: Charge Nurse/RN  Name of Charge Nurse/RN Notified  --  Odilia Bennett, RN  Provider Notification  Provider Name/Title  --  Meyer Ada, MD  Date Provider Notified  --  09/30/23  Time Provider Notified  --  1540  Method of Notification  --  Page  Notification Reason  --  Other (Comment) (mews yellow)  Provider response  --  No new orders;Other (Comment) (repeat BP in 1 hour)  Date of Provider Response  --  09/30/23  Time of Provider Response  --  1543  Assess: SIRS CRITERIA  SIRS Temperature  0  --   SIRS Respirations  0  --   SIRS Pulse 0  --   SIRS WBC 0  --   SIRS Score Sum  0  --

## 2023-09-30 NOTE — Progress Notes (Signed)
 Anticoagulation monitoring(Lovenox):  83 yo female ordered Lovenox 40 mg Q24h    Filed Weights   09/30/23 0143  Weight: 130.6 kg (288 lb)   BMI 51   Lab Results  Component Value Date   CREATININE 1.83 (H) 09/29/2023   CREATININE 1.48 (H) 08/04/2023   CREATININE 1.40 (H) 08/03/2023   Estimated Creatinine Clearance: 30.2 mL/min (A) (by C-G formula based on SCr of 1.83 mg/dL (H)). Hemoglobin & Hematocrit     Component Value Date/Time   HGB 11.6 (L) 09/29/2023 1836   HGB 11.6 (L) 07/15/2023 1257   HCT 35.9 (L) 09/29/2023 1836     Per Protocol for Patient with estCrcl > 30 ml/min and BMI > 30, will transition to Lovenox 65 mg Q24h.

## 2023-09-30 NOTE — Progress Notes (Signed)
*  PRELIMINARY RESULTS* Echocardiogram 2D Echocardiogram has been performed.  Silvana Drones 09/30/2023, 10:22 AM

## 2023-09-30 NOTE — Plan of Care (Signed)

## 2023-09-30 NOTE — Consult Note (Signed)
 Cardiology Consultation   Patient ID: Alexis Farmer MRN: [id number]; DOB: [birth year]  Admit date: 09/29/2023 Date of Consult: 09/30/2023  PCP:  [name], MD   [facility] Providers Cardiologist:  None        Patient Profile:   Alexis Farmer is a 83 y.o. female with a hx of arthritis, anxiety, essential hypertension, venous insufficiency/chronic lower extremity edema, mixed hyperlipidemia, uncontrolled type 2 diabetes, CKD stage IIIb, orbit obesity, chronic diastolic heart failure, and peripheral artery disease who is being seen 09/30/2023 for the evaluation of acute on chronic diastolic heart failure at the request of Dr. Joylene Igo.  History of Present Illness:   Ms. Bal has chronic diastolic heart failure with echo 06/2016 showing moderate concentric LVH, EF 62%, G1 DD.  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 Doppler 09/2020 showed right ICA 1 to 39% stenosis, left ICA 1 to 39% stenosis, right subclavian steal.  She also has chronic PAD causing intermittent claudication. Hospitalized 11/2020 with symptoms of anxiety. Subsequently found to have elevated troponin, this was felt to be secondary to elevated blood pressure. Echo at that time showed EF 60 to 65% with moderate concentric LVH. 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 Most recently seen by Dr. Celestine Bougie Milling 07/10/2022 for was noted to have high blood pressure. At that time she refused medication change. She was hospitalized 04/02/2023 for generalized weakness and hyponatremia. She was also noted to be volume overloaded. Echo at that time showed EF 60 to 65% with G1 DD. She was IV diuresed and resumed on home torsemide and spironolactone on discharge. Hospitalized twice 07/2023 for RSV and fall at home with lower extremity pain. She was seen in  follow-up by her PCP 08/2023 and complained of orthopnea and lower extremity weakness. No medication changes were made.  Patient reports that since discharge from the hospital in February she has not recovered from a respiratory standpoint. She has had continued shortness of breath. She also notes nausea, orthopnea, and worsening lower extremity swelling for the past few days. Reports that she forgot to take her diuretic due to not feeling well. She does not have a scale at home but does note that she likely gained weight. She denies chest pain, palpitations, vomiting, bleeding, and PND. Due to worsening shortness of breath and orthopnea she decided to present to the emergency room. On arrival to the ED, BP was elevated at 167/80 with otherwise normal vital signs. CMP with glucose elevated at 221, kidney function up from baseline at 1.83 and BUN 27. BNP was elevated at 784. Respiratory panel negative. Chest x-ray showed findings consistent with CHF. EKG without acute ischemic changes. Echo was done that showed EF 60 to 65%, moderate LVH, and G1 DD, which was unchanged from prior. Patient was given IV Lasix in the emergency department. She was admitted for further management of acute on chronic CHF. Hospital team restarted GDMT with valsartan, spironolactone, Nebivolol, and hydralazine. Cardiology was asked to consult for further management of acute on chronic diastolic heart failure.  Past Medical History:  Diagnosis Date   Anxiety 09/13/2015   Arthritis    Atypical chest pain 01/15/2021   Blackhead 10/24/2021   Bradycardia 07/17/2016   Formatting of this note might be different from the original.  Last Assessment & Plan:   Found to be bradycardic at outside hospital. They stopped metoprolol and verapamil  and heart rate has been normal since then. Echo appears to have been reassuring at the outside facility. Appears to be doing much better with regards to this. She'll continue to monitor for recurrent  symptoms. 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  Last Assessment & Plan:   Continues to have issues with this.  She is following with a specialist and it sounds as though they are planning on an MRI.  Benign exam today.   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 Rudean Corrente of this note might be different from the original.  Dr. Zelda Hickman      Last Assessment & Plan:   Recheck kidney function today.   Constipation 11/17/2019   Cough 01/25/2016   Formatting of this note might be different from the original.  Last Assessment & Plan:   Minimal nighttime cough. Improves when she washes her pillows consistently. Suspect allergies contributing. She'll monitor.   Diabetes mellitus without complication (HCC)    one elevated reading/ no treatment   Disorder of rotator cuff 06/24/2018   Diverticulitis    Dysuria 03/22/2017   Formatting of this note might be different from the original.  Last Assessment & Plan:   Symptoms concerning for UTI.  Will check urinalysis.   Edema of foot 04/08/2014   Formatting of this note might be different from the original.  Last Assessment & Plan:   Chronic pedal edema. Suspect venous insufficiency. No orthopnea or shortness of breath. Advised elevation of her legs. Consider compression stockings in the future.   Elevated troponin 12/15/2020   Essential hypertension 04/08/2014   Formatting of this note might be different from the original.  Last Assessment & Plan:   Well-controlled on recheck.  Continue current regimen.   Fall 03/26/2018   Gastroenteritis 08/12/2021   Gastrointestinal hemorrhage 08/03/2015   GI bleed    High cholesterol    History of blood transfusion    Hyperglycemia due to type 2 diabetes mellitus (HCC) 03/11/2015   Hypertension    Hypertensive urgency 12/15/2020   Low back pain 04/08/2014   Morbid obesity (HCC)  04/08/2014   Formatting of this note might be different from the original.  Last Assessment & Plan:   Weight is stable. Discussed diet and exercise at length. Encouraged whatever exercise she can do. Given diet information.   Palpitations 12/15/2020   Peripheral edema 12/22/2019   Postmenopausal bleeding 07/17/2016   Formatting of this note might be different from the original.  Last Assessment & Plan:   Recent D&C. Following with gynecology. Benign findings. Monitor for recurrence.   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  Last Assessment & Plan:   Continues to have issues with this.  She is following with a specialist and it sounds as though they are planning on an MRI.  Benign exam today.   Recurrent falls 08/13/2019   Renal insufficiency    Skin cyst 10/24/2021   Stage 3a chronic kidney disease (HCC) 01/15/2021   Swelling of lower leg 07/19/2016   Vertigo 10/13/2015   Formatting of this note might be different from the original.  Last Assessment & Plan:   No recurrence. Discussed that meclizine is an as needed medication and that  she does not need to take this daily. She will continue to monitor.    Past Surgical History:  Procedure Laterality Date   APPENDECTOMY     CHOLECYSTECTOMY     ECTOPIC PREGNANCY SURGERY     EYE SURGERY     bilateral cataracts   EYE SURGERY     02/11/2019 repair hole in right eye    gallbladder      HYSTEROSCOPY WITH D & C N/A 10/26/2016   Procedure: DILATATION AND CURETTAGE /HYSTEROSCOPY;  Surgeon: Christeen Douglas, MD;  Location: [facility] ORS;  Service: Gynecology;  Laterality: N/A;   HYSTEROSCOPY WITH D & C N/A 07/07/2018   Procedure: DILATATION AND CURETTAGE /HYSTEROSCOPY;  Surgeon: Christeen Douglas, MD;  Location: [facility] ORS;  Service: Gynecology;  Laterality: N/A;   THYROIDECTOMY, PARTIAL          Inpatient Medications: Scheduled Meds:  aspirin EC  81 mg Oral Once per day on Monday Thursday   cloNIDine  0.1 mg Oral BID   enoxaparin (LOVENOX) injection  0.5 mg/kg Subcutaneous Q24H   furosemide  40 mg Intravenous BID   hydrALAZINE  50 mg Oral Q6H   insulin aspart  0-15 Units Subcutaneous TID WC   insulin glargine-yfgn  30 Units Subcutaneous Daily   ipratropium-albuterol  3 mL Nebulization QID   nebivolol  10 mg Oral Daily   rosuvastatin  20 mg Oral Daily   Continuous Infusions:  PRN Meds: acetaminophen **OR** acetaminophen, benzonatate, hydrALAZINE, HYDROcodone-acetaminophen, LORazepam, morphine injection, ondansetron **OR** ondansetron (ZOFRAN) IV  Allergies:    Allergies  Allergen Reactions   Celexa [Citalopram]     Diarrhea upset stomach    Jardiance [Empagliflozin] Other (See Comments)    Reaction not recalled   Norvasc [Amlodipine]     Leg edema   Tape Other (See Comments)    Leaves the skin "raw" if left on for a period of time- tolerates paper tape   Penicillin V Rash   Penicillin V Potassium Rash   Penicillins Rash    Has patient had a PCN reaction causing immediate rash, facial/tongue/throat swelling, SOB or lightheadedness with hypotension: Yes Has patient had a PCN reaction causing severe rash involving mucus membranes or skin necrosis: No Has patient had a PCN reaction that required hospitalization No Has patient had a PCN reaction occurring within the last 10 years: Yes If all of the above answers are "NO", then may proceed with Cephalosporin use.     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  Tobacco Use   Smoking status:  Never   Smokeless tobacco: Never  Vaping Use   Vaping status: Never Used  Substance and Sexual Activity   Alcohol use: No   Drug use: No   Sexual activity: Not Currently  Other Topics Concern   Not on file  Social History Narrative   Lives alone    From [place]   Widowed - was married 3 times starting at age 79    hx of seamtress, 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   Social Drivers of Health   Financial Resource Strain: Low Risk  (11/29/2022)   Overall  Financial Resource Strain (CARDIA)    Difficulty of Paying Living Expenses: Not hard at all  Food Insecurity: No Food Insecurity (09/30/2023)   Hunger Vital Sign    Worried About Running Out of Food in the Last Year: Never true    Ran Out of Food in the Last Year: Never true  Transportation Needs: No Transportation Needs (09/30/2023)   PRAPARE - Administrator, Civil Service (Medical): No    Lack of Transportation (Non-Medical): No  Recent Concern: Transportation Needs - Unmet Transportation Needs (09/23/2023)   PRAPARE - Transportation    Lack of Transportation (Medical): Yes    Lack of Transportation (Non-Medical): Yes  Physical Activity: Inactive (11/29/2022)   Exercise Vital Sign    Days of Exercise per Week: 0 days    Minutes of Exercise per Session: 0 min  Stress: Stress Concern Present (11/29/2022)   [employer] of Occupational Health - Occupational Stress Questionnaire    Feeling of Stress : To some extent  Social Connections: Moderately Integrated (09/30/2023)   Social Connection and Isolation Panel [NHANES]    Frequency of Communication with Friends and Family: More than three times a week    Frequency of Social Gatherings with Friends and Family: More than three times a week    Attends Religious Services: More than 4 times per year    Active Member of [employer] or Organizations: Yes    Attends Banker Meetings: More than 4 times per year    Marital Status:  Widowed  Intimate Partner Violence: Not At Risk (09/30/2023)   Humiliation, Afraid, Rape, and Kick questionnaire    Fear of Current or Ex-Partner: No    Emotionally Abused: No    Physically Abused: No    Sexually Abused: No    Family History:    Family History  Problem Relation Age of Onset   Diabetes Mother    Hypertension Mother    Stroke Mother    Diabetes Other    Healthy Father    Diabetes Sister    Heart disease Sister      ROS:  Please see the history of present illness.   Physical Exam/Data:   Vitals:   09/30/23 1100 09/30/23 1124 09/30/23 1130 09/30/23 1200  BP: (!) 187/64  (!) 197/81 (!) 198/63  Pulse: 61  (!) 59 69  Resp: (!) 21  14 (!) 25  Temp:  97.8 F (36.6 C)    TempSrc:  Oral    SpO2: 100%  100% 94%  Weight:      Height:       No intake or output data in the 24 hours ending 09/30/23 1457    09/30/2023    1:43 AM 09/09/2023    1:20 PM 08/03/2023    1:16 PM  Last 3 Weights  Weight (lbs) 288 lb 257 lb 15 oz 257 lb 15 oz  Weight (kg) 130.636 kg 117.001 kg 117 kg     Body mass index is 51.02 kg/m.  General:  Well nourished, well developed, in no acute distress HEENT: normal Neck: no JVD Vascular: No carotid bruits; Distal pulses 2+ bilaterally Cardiac:  normal S1, S2; RRR; no murmur  Lungs: diminished throughout Abd: soft, nontender, no hepatomegaly  Ext: 2+ LE pitting edema Skin: warm and dry  Psych:  Normal affect   EKG:  The EKG was personally reviewed and demonstrates:  Sinus rhythm with LAD, LVH, prior anterior infarct, rate 71 bpm Telemetry:  Telemetry was personally reviewed  and demonstrates:  Sinus rhythm with PACs and PVCs  Relevant CV Studies:  09/30/2023 Echo complete 1. Left ventricular ejection fraction, by estimation, is 60 to 65%. The  left ventricle has normal function. The left ventricle has no regional  wall motion abnormalities. There is moderate left ventricular hypertrophy.  Left ventricular diastolic  parameters are  consistent with Grade I diastolic dysfunction (impaired  relaxation). Elevated left atrial pressure.   2. Right ventricular systolic function is normal. The right ventricular  size is normal. There is severely elevated pulmonary artery systolic  pressure. The estimated right ventricular systolic pressure is 80.0 mmHg.   3. Right atrial size was mildly dilated.   4. The mitral valve is normal in structure. Trivial mitral valve  regurgitation. No evidence of mitral stenosis.   5. The aortic valve is tricuspid. Aortic valve regurgitation is not  visualized. No aortic stenosis is present.   6. The inferior vena cava is dilated in size with <50% respiratory  variability, suggesting right atrial pressure of 15 mmHg.   Laboratory Data:  High Sensitivity Troponin:   Recent Labs  Lab 09/29/23 1836  TROPONINIHS 31*     Chemistry Recent Labs  Lab 09/29/23 1836  NA 135  K 4.4  CL 101  CO2 26  GLUCOSE 221*  BUN 27*  CREATININE 1.83*  CALCIUM 9.0  GFRNONAA 27*  ANIONGAP 8    Recent Labs  Lab 09/29/23 1836  PROT 7.0  ALBUMIN 2.8*  AST 42*  ALT 24  ALKPHOS 54  BILITOT 1.1   Lipids No results for input(s): "CHOL", "TRIG", "HDL", "LABVLDL", "LDLCALC", "CHOLHDL" in the last 168 hours.  Hematology Recent Labs  Lab 09/29/23 1836  WBC 7.8  RBC 4.01  HGB 11.6*  HCT 35.9*  MCV 89.5  MCH 28.9  MCHC 32.3  RDW 14.7  PLT 670*   Thyroid No results for input(s): "TSH", "FREET4" in the last 168 hours.  BNP Recent Labs  Lab 09/29/23 1836  BNP 784.0*    DDimer No results for input(s): "DDIMER" in the last 168 hours.   Radiology/Studies:   [facility] Chest Port 1 View Result Date: 09/29/2023 IMPRESSION: Findings suggest CHF. Electronically Signed   By: Sydell Eva M.D.   On: 09/29/2023 19:25   Assessment and Plan:   Acute on chronic HFpEF - Patient presented with several day history of worsening dyspnea, lower extremity swelling, and orthopnea. Notes missing a dose of her  diuretic. - Weight on discharge 3/24 was 117 kg, now 130 kg - Echo this admission with EF 60-65%, no RWMA, mod LVH, G1DD, unchanged from prior echo - Received IV Lasix 60 mg, 40 mg X2 in the ED with no I/Os recorded - Continue IV Lasix 40 mg twice daily - Continue to monitor kidney function, strict I's and O's, and daily weights with ongoing diuresis - Continue PTA Nebivolol and hydralazine - Valsartan and spironolactone currently held due to AKI  Hypertension - BP continues to run high, possible rebound hypertension due to being without clonidine - Continue hydralazine, clonidine, and nebivolol  T2DM - Longstanding history of uncontrolled T2DM - Management per IM  AKI on CKD IIIb - Cr 1.83 and BUN 27 on admission - Suspect cardiorenal, anticipate improvement with ongoing diuresis - Valsartan and spironolactone held - Continue to monitor with daily BMET  For questions or updates, please contact [facility] Please consult [email or web address] for contact info under    Signed, Brodie Cannon, PA-C  09/30/2023 2:57 PM

## 2023-10-01 DIAGNOSIS — N179 Acute kidney failure, unspecified: Secondary | ICD-10-CM | POA: Diagnosis not present

## 2023-10-01 DIAGNOSIS — I272 Pulmonary hypertension, unspecified: Secondary | ICD-10-CM | POA: Diagnosis not present

## 2023-10-01 DIAGNOSIS — I5033 Acute on chronic diastolic (congestive) heart failure: Secondary | ICD-10-CM | POA: Diagnosis not present

## 2023-10-01 DIAGNOSIS — N1832 Chronic kidney disease, stage 3b: Secondary | ICD-10-CM | POA: Diagnosis not present

## 2023-10-01 LAB — CBC
HCT: 35 % — ABNORMAL LOW (ref 36.0–46.0)
Hemoglobin: 10.9 g/dL — ABNORMAL LOW (ref 12.0–15.0)
MCH: 28.9 pg (ref 26.0–34.0)
MCHC: 31.1 g/dL (ref 30.0–36.0)
MCV: 92.8 fL (ref 80.0–100.0)
Platelets: 628 10*3/uL — ABNORMAL HIGH (ref 150–400)
RBC: 3.77 MIL/uL — ABNORMAL LOW (ref 3.87–5.11)
RDW: 14.9 % (ref 11.5–15.5)
WBC: 7.3 10*3/uL (ref 4.0–10.5)
nRBC: 0 % (ref 0.0–0.2)

## 2023-10-01 LAB — C DIFFICILE QUICK SCREEN W PCR REFLEX
C Diff antigen: NEGATIVE
C Diff interpretation: NOT DETECTED
C Diff toxin: NEGATIVE

## 2023-10-01 LAB — BASIC METABOLIC PANEL WITH GFR
Anion gap: 6 (ref 5–15)
BUN: 27 mg/dL — ABNORMAL HIGH (ref 8–23)
CO2: 28 mmol/L (ref 22–32)
Calcium: 8.9 mg/dL (ref 8.9–10.3)
Chloride: 103 mmol/L (ref 98–111)
Creatinine, Ser: 1.89 mg/dL — ABNORMAL HIGH (ref 0.44–1.00)
GFR, Estimated: 26 mL/min — ABNORMAL LOW (ref >60)
Glucose, Bld: 175 mg/dL — ABNORMAL HIGH (ref 70–99)
Potassium: 3.8 mmol/L (ref 3.5–5.1)
Sodium: 137 mmol/L (ref 135–145)

## 2023-10-01 LAB — GLUCOSE, CAPILLARY
Glucose-Capillary: 182 mg/dL — ABNORMAL HIGH (ref 70–99)
Glucose-Capillary: 208 mg/dL — ABNORMAL HIGH (ref 70–99)
Glucose-Capillary: 134 mg/dL — ABNORMAL HIGH (ref 70–99)
Glucose-Capillary: 128 mg/dL — ABNORMAL HIGH (ref 70–99)
Glucose-Capillary: 100 mg/dL — ABNORMAL HIGH (ref 70–99)

## 2023-10-01 MED ORDER — HYDRALAZINE HCL 50 MG PO TABS
100.0000 mg | ORAL_TABLET | Freq: Three times a day (TID) | ORAL | Status: DC
  Administered 2023-10-01 – 2023-10-05 (×14): 100 mg via ORAL
  Filled 2023-10-01 (×15): qty 2

## 2023-10-01 MED ORDER — HYDRALAZINE HCL 50 MG PO TABS
50.0000 mg | ORAL_TABLET | Freq: Four times a day (QID) | ORAL | Status: DC
  Administered 2023-10-01: 50 mg via ORAL
  Filled 2023-10-01: qty 1

## 2023-10-01 MED ORDER — IPRATROPIUM-ALBUTEROL 0.5-2.5 (3) MG/3ML IN SOLN
3.0000 mL | Freq: Four times a day (QID) | RESPIRATORY_TRACT | Status: DC | PRN
  Administered 2023-10-01 – 2023-10-11 (×2): 3 mL via RESPIRATORY_TRACT
  Filled 2023-10-01 (×2): qty 3

## 2023-10-01 MED ORDER — MELATONIN 5 MG PO TABS
5.0000 mg | ORAL_TABLET | Freq: Every evening | ORAL | Status: DC | PRN
  Administered 2023-10-02 (×2): 5 mg via ORAL
  Filled 2023-10-01 (×3): qty 1

## 2023-10-01 NOTE — Plan of Care (Signed)
  Problem: Coping: Goal: Ability to adjust to condition or change in health will improve Outcome: Progressing   Problem: Fluid Volume: Goal: Ability to maintain a balanced intake and output will improve Outcome: Progressing   Problem: Metabolic: Goal: Ability to maintain appropriate glucose levels will improve Outcome: Progressing   Problem: Tissue Perfusion: Goal: Adequacy of tissue perfusion will improve Outcome: Progressing   

## 2023-10-01 NOTE — Plan of Care (Signed)
  Problem: Metabolic: Goal: Ability to maintain appropriate glucose levels will improve Outcome: Progressing   Problem: Clinical Measurements: Goal: Respiratory complications will improve Outcome: Progressing   Problem: Clinical Measurements: Goal: Cardiovascular complication will be avoided Outcome: Progressing   Problem: Coping: Goal: Level of anxiety will decrease Outcome: Progressing

## 2023-10-01 NOTE — Progress Notes (Addendum)
 Progress Note   Patient: Alexis Farmer WUJ:[id number] DOB: 11-10-1940 DOA: 09/29/2023     1 DOS: the patient was seen and examined on 10/01/2023   Brief hospital course:  Alexis Farmer is a 83 y.o. female with medical history significant for monoclonal B-cell lymphoma, stage IIIb CKD, DM, HTN, HFpEF, chronic pain, obesity, immobility , hospitalized in February with a UTI, and in November with RSV who presents by EMS with a 2-week history of shortness of breath.  She states since her RSV infection her breathing never got quite right however over the past few days she has had worsening shortness of breath, with orthopnea and lower extremity edema. ED course and data review: SBP in the 200s HB at 11.6 and platelets 670. Creatinine slightly above baseline at 1.83 Glucose 221 EKG, personally viewed and interpreted showing sinus at 71 with LVH and no acute ST-T wave changes. Chest x-ray with findings suggestive of CHF Patient given IV Lasix 60 mg Hospitalist consulted for admission.      Assessment and Plan:  * Acute on chronic heart failure with preserved ejection fraction (HFpEF) (HCC) Hypertensive urgency Elevated troponin Myocardial injury Elevated troponin likely related to demand ischemia from CHF and elevated BP  Patient denies having any chest pain and shortness of breath has improved.   She remains on nasal cannula at 4 L to maintain pulse oximetry greater than 92% Continue IV Lasix  Continue home GDMT, with Nebivolol and hydralazine Spironolactone and Avapro on hold due to worsening renal function Patient has had a 4 kg  weight loss We will attempt to wean off oxygen as tolerated      Uncontrolled type 2 diabetes mellitus with hyperglycemia, with long-term current use of insulin (HCC) Blood sugar over 200 Patient on NPH and regular insulin at home ??  Compliance Continue Lantus 30 units daily with sliding scale coverage Maintain consistent carbohydrate diet      Acute renal failure superimposed on stage 3b chronic kidney disease (HCC) Creatinine 1.88 up from baseline of 1.4 Monitor for worsening renal function in view of IV diuretic therapy Hold spironolactone and Avapro Monitor renal function closely     Obesity, Class III, BMI 40-49.9 (morbid obesity) (HCC) Complicating factor to overall prognosis and care Lifestyle modification and exercise has been discussed with patient in detail   HTN Uncontrolled and may be rebound since patient was on clonidine but has not been resumed Continue hydralazine, clonidine, nebivolol Monitor heart rate closely         Subjective: Sitting up in a recliner.  Remains fluid overloaded.  Lower extremity swelling persists as well as swelling in her hands.  Physical Exam: Vitals:   10/01/23 0432 10/01/23 0515 10/01/23 0901 10/01/23 1104  BP: (!) 152/49  (!) 210/68 (!) 179/60  Pulse: 67  84 69  Resp: 18     Temp: 98.1 F (36.7 C)  98.9 F (37.2 C) 98.8 F (37.1 C)  TempSrc:      SpO2: 100%  100% 100%  Weight:  128.9 kg    Height:       Vitals and nursing note reviewed.  Constitutional:      General: She is not in acute distress. HENT:     Head: Normocephalic and atraumatic.  Cardiovascular:     Rate and Rhythm: Normal rate and regular rhythm.     Heart sounds: Normal heart sounds.  Pulmonary:     Effort: WNL     Breath sounds: Rales present.  Abdominal:  Palpations: Abdomen is soft.     Tenderness: There is no abdominal tenderness.  Musculoskeletal:     Right lower leg: 3+ Edema present.     Left lower leg: 3+ Edema present.  Neurological:     Mental Status: Mental status is at baseline.     Data Reviewed: BUN 27, creatinine 1.89, glucose 175 Labs reviewed  Family Communication: Plan of care was discussed with patient in detail.  She verbalizes understanding and agrees with the plan.  Disposition: Status is: Inpatient Remains inpatient appropriate because: On IV diuretics  for acute CHF  Planned Discharge Destination:  TBD    Time spent: 37 minutes  Author: Read Camel, MD 10/01/2023 12:54 PM  For on call review [email or web address].

## 2023-10-01 NOTE — Progress Notes (Signed)
 Rounding Note    Patient Name: Alexis Farmer Date of Encounter: 10/01/2023  [facility] Cardiologist: None   Subjective   Patient reports mild improvement in dyspnea. Remains grossly volume overloaded on exam. I/Os poorly recorded despite aggressive diuresis. Weight down 4 lbs since yesterday. Kidney function up slightly from prior.   Inpatient Medications    Scheduled Meds:  aspirin EC  81 mg Oral Once per day on Monday Thursday   cloNIDine  0.1 mg Oral BID   enoxaparin (LOVENOX) injection  0.5 mg/kg Subcutaneous Q24H   furosemide  40 mg Intravenous BID   hydrALAZINE  50 mg Oral Q6H   insulin aspart  0-15 Units Subcutaneous TID WC   insulin glargine-yfgn  30 Units Subcutaneous Daily   ipratropium-albuterol  3 mL Nebulization QID   nebivolol  10 mg Oral Daily   rosuvastatin  20 mg Oral Daily   Continuous Infusions:  PRN Meds: acetaminophen **OR** acetaminophen, benzonatate, hydrALAZINE, HYDROcodone-acetaminophen, LORazepam, morphine injection, ondansetron **OR** ondansetron (ZOFRAN) IV   Vital Signs    Vitals:   09/30/23 2341 10/01/23 0432 10/01/23 0515 10/01/23 0901  BP: (!) 149/59 (!) 152/49  (!) 210/68  Pulse:  67  84  Resp:  18    Temp:  98.1 F (36.7 C)  98.9 F (37.2 C)  TempSrc:      SpO2:  100%  100%  Weight:   128.9 kg   Height:        Intake/Output Summary (Last 24 hours) at 10/01/2023 0928 Last data filed at 10/01/2023 0400 Gross per 24 hour  Intake 240 ml  Output 925 ml  Net -685 ml      10/01/2023    5:15 AM 09/30/2023    1:43 AM 09/09/2023    1:20 PM  Last 3 Weights  Weight (lbs) 284 lb 2.8 oz 288 lb 257 lb 15 oz  Weight (kg) 128.9 kg 130.636 kg 117.001 kg      Telemetry    Sinus rhythm - Personally Reviewed  Physical Exam   GEN: No acute distress.   Neck: Difficult to assess JVD Cardiac: RRR, no murmurs, rubs, or gallops.  Respiratory: Diminished GI: Soft, nontender, non-distended  MS: 1+ LE edema; No  deformity. Neuro:  Nonfocal  Psych: Normal affect   Labs    High Sensitivity Troponin:   Recent Labs  Lab 09/29/23 1836  TROPONINIHS 31*     Chemistry Recent Labs  Lab 09/29/23 1836  NA 135  K 4.4  CL 101  CO2 26  GLUCOSE 221*  BUN 27*  CREATININE 1.83*  CALCIUM 9.0  PROT 7.0  ALBUMIN 2.8*  AST 42*  ALT 24  ALKPHOS 54  BILITOT 1.1  GFRNONAA 27*  ANIONGAP 8    Lipids No results for input(s): "CHOL", "TRIG", "HDL", "LABVLDL", "LDLCALC", "CHOLHDL" in the last 168 hours.  Hematology Recent Labs  Lab 09/29/23 1836 10/01/23 0826  WBC 7.8 7.3  RBC 4.01 3.77*  HGB 11.6* 10.9*  HCT 35.9* 35.0*  MCV 89.5 92.8  MCH 28.9 28.9  MCHC 32.3 31.1  RDW 14.7 14.9  PLT 670* 628*   Thyroid No results for input(s): "TSH", "FREET4" in the last 168 hours.  BNP Recent Labs  Lab 09/29/23 1836  BNP 784.0*    DDimer No results for input(s): "DDIMER" in the last 168 hours.   Radiology   DG Chest Port 1 View Result Date: 09/29/2023 IMPRESSION: Findings suggest CHF. Electronically Signed   By: Layla Maw  M.D.   On: 09/29/2023 19:25   Cardiac Studies   09/30/2023 Echo complete 1. Left ventricular ejection fraction, by estimation, is 60 to 65%. The  left ventricle has normal function. The left ventricle has no regional  wall motion abnormalities. There is moderate left ventricular hypertrophy.  Left ventricular diastolic  parameters are consistent with Grade I diastolic dysfunction (impaired  relaxation). Elevated left atrial pressure.   2. Right ventricular systolic function is normal. The right ventricular  size is normal. There is severely elevated pulmonary artery systolic  pressure. The estimated right ventricular systolic pressure is 80.0 mmHg.   3. Right atrial size was mildly dilated.   4. The mitral valve is normal in structure. Trivial mitral valve  regurgitation. No evidence of mitral stenosis.   5. The aortic valve is tricuspid. Aortic valve regurgitation  is not  visualized. No aortic stenosis is present.   6. The inferior vena cava is dilated in size with <50% respiratory  variability, suggesting right atrial pressure of 15 mmHg.   Patient Profile     Alexis Farmer is a 83 y.o. female with a hx of arthritis, anxiety, essential hypertension, venous insufficiency/chronic lower extremity edema, mixed hyperlipidemia, uncontrolled type 2 diabetes, CKD stage IIIb, morbid obesity, chronic diastolic heart failure, and peripheral artery disease who is being seen for the continued evaluation of acute on chronic diastolic heart failure.  Assessment & Plan    Acute on chronic HFpEF - Weight significantly up from baseline - Appears volume overloaded on exam - Echo this admission with EF 60 to 65%, no TWMA, mod LVH, G1DD, RA pressure 15 mmHg - I/Os poorly recorded, weight down 4 lbs since yesterday - Continue IV Lasix 40 mg twice daily - Continue to monitor kidney function, strict I's and O's, and daily weights with ongoing diuresis - Continue to be a Nebivolol  - Spironolactone and valsartan currently held due to AKI, would favor reintroduction prior to discharge - May benefit from RHC in the future  Hypertension - Blood pressure remains elevated - Continue clonidine and Nebivolol - Will increase dose of hydralazine to 100 mg 3 times daily  T2DM - Longstanding history of uncontrolled type 2 diabetes - Management per IM  AKI on CKD IIIb - Cr up slightly from prior, BUN stable - Continue to avoid nephrotoxic agents - Continue to monitor  For questions or updates, please contact [facility] Please consult [email or web address] for contact info under        Signed, Brodie Cannon, PA-C  10/01/2023, 9:28 AM

## 2023-10-02 DIAGNOSIS — E1121 Type 2 diabetes mellitus with diabetic nephropathy: Secondary | ICD-10-CM | POA: Diagnosis not present

## 2023-10-02 DIAGNOSIS — Z794 Long term (current) use of insulin: Secondary | ICD-10-CM

## 2023-10-02 DIAGNOSIS — I10 Essential (primary) hypertension: Secondary | ICD-10-CM

## 2023-10-02 DIAGNOSIS — I5033 Acute on chronic diastolic (congestive) heart failure: Secondary | ICD-10-CM | POA: Diagnosis not present

## 2023-10-02 DIAGNOSIS — R0602 Shortness of breath: Principal | ICD-10-CM

## 2023-10-02 LAB — CBC
HCT: 31.9 % — ABNORMAL LOW (ref 36.0–46.0)
Hemoglobin: 10.4 g/dL — ABNORMAL LOW (ref 12.0–15.0)
MCH: 29 pg (ref 26.0–34.0)
MCHC: 32.6 g/dL (ref 30.0–36.0)
MCV: 88.9 fL (ref 80.0–100.0)
Platelets: 571 10*3/uL — ABNORMAL HIGH (ref 150–400)
RBC: 3.59 MIL/uL — ABNORMAL LOW (ref 3.87–5.11)
RDW: 14.6 % (ref 11.5–15.5)
WBC: 7.1 10*3/uL (ref 4.0–10.5)
nRBC: 0 % (ref 0.0–0.2)

## 2023-10-02 LAB — GLUCOSE, CAPILLARY
Glucose-Capillary: 92 mg/dL (ref 70–99)
Glucose-Capillary: 195 mg/dL — ABNORMAL HIGH (ref 70–99)
Glucose-Capillary: 130 mg/dL — ABNORMAL HIGH (ref 70–99)
Glucose-Capillary: 120 mg/dL — ABNORMAL HIGH (ref 70–99)

## 2023-10-02 LAB — BASIC METABOLIC PANEL WITH GFR
Anion gap: 6 (ref 5–15)
BUN: 26 mg/dL — ABNORMAL HIGH (ref 8–23)
CO2: 27 mmol/L (ref 22–32)
Calcium: 8.6 mg/dL — ABNORMAL LOW (ref 8.9–10.3)
Chloride: 103 mmol/L (ref 98–111)
Creatinine, Ser: 1.73 mg/dL — ABNORMAL HIGH (ref 0.44–1.00)
GFR, Estimated: 29 mL/min — ABNORMAL LOW (ref >60)
Glucose, Bld: 79 mg/dL (ref 70–99)
Potassium: 3.4 mmol/L — ABNORMAL LOW (ref 3.5–5.1)
Sodium: 136 mmol/L (ref 135–145)

## 2023-10-02 MED ORDER — POTASSIUM CHLORIDE CRYS ER 20 MEQ PO TBCR
40.0000 meq | EXTENDED_RELEASE_TABLET | Freq: Once | ORAL | Status: AC
  Administered 2023-10-02: 40 meq via ORAL
  Filled 2023-10-02: qty 2

## 2023-10-02 MED ORDER — INSULIN GLARGINE-YFGN 100 UNIT/ML ~~LOC~~ SOLN
10.0000 [IU] | Freq: Once | SUBCUTANEOUS | Status: AC
  Administered 2023-10-02: 10 [IU] via SUBCUTANEOUS
  Filled 2023-10-02: qty 0.1

## 2023-10-02 MED ORDER — CARVEDILOL 25 MG PO TABS
25.0000 mg | ORAL_TABLET | Freq: Two times a day (BID) | ORAL | Status: DC
  Administered 2023-10-02 – 2023-10-03 (×2): 25 mg via ORAL
  Filled 2023-10-02 (×3): qty 1

## 2023-10-02 MED ORDER — HYDRALAZINE HCL 20 MG/ML IJ SOLN
10.0000 mg | INTRAMUSCULAR | Status: DC | PRN
  Administered 2023-10-02: 10 mg via INTRAVENOUS
  Filled 2023-10-02: qty 1

## 2023-10-02 MED ORDER — CLONIDINE HCL 0.1 MG PO TABS
0.2000 mg | ORAL_TABLET | Freq: Two times a day (BID) | ORAL | Status: DC
  Administered 2023-10-02 – 2023-10-05 (×7): 0.2 mg via ORAL
  Filled 2023-10-02 (×7): qty 2

## 2023-10-02 MED ORDER — CLONIDINE HCL 0.1 MG PO TABS
0.1000 mg | ORAL_TABLET | Freq: Once | ORAL | Status: AC
  Administered 2023-10-02: 0.1 mg via ORAL
  Filled 2023-10-02: qty 1

## 2023-10-02 NOTE — Progress Notes (Signed)
 Pt called out stating that she was having trouble breathing. This RN as well as two other RNs, and the charge nurse came to the patient's aid to assist. The patient was slid up and set up in bed to aid in breathing. Spot pulse ox was checked with a 91% O2 sat on room air. Pt was educated on the importance of wearing O2 to aid in breathing. Pt stated that she did not want to wear O2 at this time. Pt Vitals were taken and an elevated BP was noted. This RN attempted to educate and administer BP medication but pt refused stating that she did not want this RN to give her medication. Pt appears confused and continues to refuse to wear O2 [place]. Will continue to monitor pt closely.

## 2023-10-02 NOTE — Progress Notes (Addendum)
 Progress Note   Patient: Alexis Farmer JWJ:[id number] DOB: 02-08-1941 DOA: 09/29/2023     2 DOS: the patient was seen and examined on 10/02/2023   Brief hospital course:  Alexis Farmer is a 83 y.o. female with medical history significant for monoclonal B-cell lymphoma, stage IIIb CKD, DM, HTN, HFpEF, chronic pain, obesity, immobility , hospitalized in February with a UTI, and in November with RSV who presents by EMS with a 2-week history of shortness of breath.  She states since her RSV infection her breathing never got quite right however over the past few days she has had worsening shortness of breath, with orthopnea and lower extremity edema. ED course and data review: SBP in the 200s HB at 11.6 and platelets 670. Creatinine slightly above baseline at 1.83 Glucose 221 EKG, personally viewed and interpreted showing sinus at 71 with LVH and no acute ST-T wave changes. Chest x-ray with findings suggestive of CHF Patient given IV Lasix 60 mg Hospitalist consulted for admission.      Assessment and Plan:  * Acute on chronic heart failure with preserved ejection fraction (HFpEF) (HCC) Hypertensive urgency Elevated troponin Myocardial injury Elevated troponin likely related to demand ischemia from CHF and elevated BP  Patient denies having any chest pain and shortness of breath has improved.   Weaned from 4 >> 2 L/min O2 --Continue IV Lasix  Net IO Since Admission: -905 mL [10/02/23 1703] --Continue home GDMT, with Nebivolol and hydralazine --Spironolactone and Avapro on hold due to worsening renal function --Wean off oxygen as tolerated  Hypertensive Urgency -- BP's severely elevated with systolic 200 Uncontrolled and may be rebound since patient was on clonidine but has not been resumed --Spironolactone and Avapro are on hold for AKI - resume when renal function permits --Clonidine increased to 0.2 mg --Hydralazine 100 mg TID --Bystolic was changed to Coreg 25 mg  BID --Monitor Bps closely and titrate regimen  Uncontrolled type 2 diabetes mellitus with hyperglycemia, with long-term current with renal complications A1c 9.7% Patient on NPH and regular insulin at home --Continue Lantus 30 units daily with sliding scale coverage --Carb modified diet   AKI superimposed on CKD stage 3b  Creatinine 1.88 up from baseline of 1.4 Monitor for worsening renal function in view of IV diuretic therapy --Hold spironolactone and Avapro --Monitor renal function closely   Obesity, Class III, BMI 40-49.9 (morbid obesity) (HCC) Complicating factor to overall prognosis and care Lifestyle modification and exercise has been discussed with patient in detail      Subjective: Sitting up in a recliner, sleeping but woke to voice.  She denies complaints including CP, SOB, N/V, F/C.  States her family knows she is here, does not wish for me to call them.   Physical Exam: Vitals:   10/02/23 0505 10/02/23 0817 10/02/23 1300 10/02/23 1638  BP: (!) 186/93 (!) 200/74 (!) 146/101 (!) 195/68  Pulse: 72 78  67  Resp: 20     Temp: 99.4 F (37.4 C) 98.5 F (36.9 C) 98.6 F (37 C) 98.1 F (36.7 C)  TempSrc:   Oral Oral  SpO2: 97% 100% 98% 99%  Weight:      Height:       General exam: sleeping, woke to voice, no acute distress, obese HEENT: moist mucus membranes, hearing grossly normal  Respiratory system: CTAB diminished bases, no wheezes, normal respiratory effort. Cardiovascular system: normal S1/S2, RRR, no JVD, murmurs, rubs, gallops, 3+ BLE pitting edema.   Gastrointestinal system: soft, NT, ND, no HSM  felt, +bowel sounds. Central nervous system: O x3. no gross focal neurologic deficits, normal speech Skin: dry, intact, normal temperature Psychiatry: normal mood, congruent affect, judgement and insight appear normal     Data Reviewed: Notable labs --  K 3.4 BUN 26 Cr 1.73 from 1.89 improving Ca 8.6 Hbg stable 10.4  C diff negative  Family  Communication: Plan of care was discussed with patient in detail.  No family at bedside, pt declines need for me to call anyone at this time.  Disposition: Status is: Inpatient Remains inpatient appropriate because: On IV diuretics for acute CHF    Planned Discharge Destination:  TBD     Time spent: 45 minutes  Author: Montey Apa, DO 10/02/2023 5:00 PM  For on call review [email or web address].

## 2023-10-02 NOTE — TOC Initial Note (Signed)
 Transition of Care [facility]) - Initial/Assessment Note    Patient Details  Name: Alexis Farmer MRN: [id number] Date of Birth: 07-Aug-1940  [facility]) CM/SW Contact:    Odilia Bennett, LCSW Phone Number: 10/02/2023, 3:04 PM  Clinical Narrative:    Per Patient Ping, patient with [facility]. CSW called liaison who confirmed and stated patient is receiving PT and OT. No further concerns. CSW will continue to follow patient for support and facilitate return home once stable.              Expected Discharge Plan: Home w Home Health Services Barriers to Discharge: Continued Medical Work up   Patient Goals and CMS Choice            Expected Discharge Plan and Services       Living arrangements for the past 2 months: Single Family Home                           HH Arranged: PT, OT HH Agency: [facility] Date HH Agency Contacted: 10/02/23   Representative spoke with at [facility] Agency: Shaun  Prior Living Arrangements/Services Living arrangements for the past 2 months: Single Family Home   Patient language and need for interpreter reviewed:: Yes        Need for Family Participation in Patient Care: Yes (Comment)   Current home services: Home PT, Home OT Criminal Activity/Legal Involvement Pertinent to Current Situation/Hospitalization: No - Comment as needed  Activities of Daily Living   ADL Screening (condition at time of admission) Independently performs ADLs?: No Does the patient have a NEW difficulty with bathing/dressing/toileting/self-feeding that is expected to last >3 days?: Yes (Initiates electronic notice to provider for possible OT consult) Does the patient have a NEW difficulty with getting in/out of bed, walking, or climbing stairs that is expected to last >3 days?: Yes (Initiates electronic notice to provider for possible PT consult) Does the patient have a NEW difficulty with communication that is expected to last >3  days?: No Is the patient deaf or have difficulty hearing?: No Does the patient have difficulty seeing, even when wearing glasses/contacts?: No Does the patient have difficulty concentrating, remembering, or making decisions?: No  Permission Sought/Granted                  Emotional Assessment       Orientation: : Oriented to Self, Oriented to Place, Oriented to  Time, Oriented to Situation Alcohol / Substance Use: Not Applicable Psych Involvement: No (comment)  Admission diagnosis:  Shortness of breath [R06.02] Acute exacerbation of CHF (congestive heart failure) (HCC) [I50.9] Congestive heart failure, unspecified HF chronicity, unspecified heart failure type (HCC) [I50.9] Patient Active Problem List   Diagnosis Date Noted   Shortness of breath 10/02/2023   Morbid obesity (HCC) 10/02/2023   Severe pulmonary hypertension (HCC) 10/01/2023   Acute exacerbation of CHF (congestive heart failure) (HCC) 09/29/2023   RSV infection 08/08/2023   Obesity (BMI 30-39.9) 08/04/2023   Diffuse pain 08/03/2023   Abdominal pain 08/03/2023   Abdominal pain, LLQ 08/03/2023   Cellulitis of left lower extremity 04/02/2023   Hyponatremia 04/01/2023   Acute on chronic heart failure with preserved ejection fraction (HFpEF) (HCC) 04/01/2023   Cellulitis 04/01/2023   Lower extremity weakness 04/01/2023   Inability to access health care due to transportation insecurity 03/31/2023   Mood disorder (HCC) 01/20/2023   Vision loss, bilateral 10/22/2022   Pre-syncope  10/22/2022   Ceruminosis, right 09/07/2022   Type 2 diabetes mellitus without complications (HCC) 08/14/2022   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/2022   Hallucinations, visual 07/20/2022   Need for pneumococcal vaccination 07/20/2022   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   Chronic renal failure (CRF), stage 3b (HCC) 03/21/2022   Ileus (HCC) 08/22/2021   Lymphedema 04/19/2021   Atherosclerosis of aorta  (HCC) 01/16/2021   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/16/2021   Spinal stenosis of lumbar region 01/16/2021   Panic attack 01/15/2021   Hypertensive urgency 12/15/2020   Other chronic pain 08/25/2020   Hypertension associated with diabetes (HCC) 08/25/2020   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) 05/30/2020   Retinopathy 05/30/2020   Multinodular goiter 02/04/2020   Proteinuria 12/22/2019   Physical deconditioning 08/13/2019   Abdominal aortic atherosclerosis (HCC) 07/24/2019   Obesity, Class III, BMI 40-49.9 (morbid obesity) (HCC) 06/09/2019   Hypercalcemia 03/18/2019   Acute renal failure superimposed on stage 3b chronic kidney disease (HCC) 02/06/2019   Round hole, unspecified eye 02/06/2019   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 08/13/2018   Abnormal gait 03/26/2018   Arthritis 03/26/2018   Thyroid nodule 01/20/2018   Cerumen impaction 01/21/2017   UTI (urinary tract infection) 09/24/2016   Rectal hemorrhage 07/29/2016   Carotid artery stenosis 07/19/2016   PAD (peripheral artery disease) (HCC) 07/19/2016   Chronic diastolic CHF (congestive heart failure) (HCC) 05/31/2016   Chronic fatigue 11/15/2015   Depression, recurrent (HCC) 10/13/2015   Osteoarthritis 10/13/2015   Mixed hyperlipidemia 08/26/2014   Uncontrolled type 2 diabetes mellitus with hyperglycemia, with long-term current use of insulin (HCC) 04/08/2014   Diabetic polyneuropathy (HCC) 04/08/2014   Type 2 diabetes mellitus with other diabetic kidney complication (HCC) 04/08/2014   PCP:  Valli Gaw, MD Pharmacy:   [facility] [address] [place] - [address] [place] [address] Phone: [phone] Fax: [phone]  [id number] - [place], [place] - [address] [place] [address] Phone:  [phone] Fax: [phone]     Social Drivers of Health (SDOH) Social History: SDOH Screenings   Food Insecurity: No Food Insecurity (09/30/2023)  Housing: Low Risk  (09/30/2023)  Transportation Needs: No Transportation Needs (09/30/2023)  Recent Concern: Transportation Needs - Unmet Transportation Needs (09/23/2023)  Utilities: Not At Risk (09/30/2023)  Alcohol Screen: Low Risk  (11/29/2022)  Depression (PHQ2-9): High Risk (09/23/2023)  Financial Resource Strain: Low Risk  (11/29/2022)  Physical Activity: Inactive (11/29/2022)  Social Connections: Moderately Integrated (09/30/2023)  Stress: Stress Concern Present (11/29/2022)  Tobacco Use: Low Risk  (09/30/2023)   SDOH Interventions:     Readmission Risk Interventions     No data to display

## 2023-10-02 NOTE — Progress Notes (Signed)
 Heart Failure Nurse Navigator Progress Note   Pt sleeping at this time. TOC appointment scheduled and entered on her AVS for discharge and will return for HF education when she is awake.  Celedonio Coil, RN, BSN [facility] Heart Failure Navigator Secure Chat Only

## 2023-10-02 NOTE — Plan of Care (Signed)
  Problem: Education: Goal: Individualized Educational Video(s) Outcome: Progressing   Problem: Coping: Goal: Ability to adjust to condition or change in health will improve Outcome: Progressing   Problem: Fluid Volume: Goal: Ability to maintain a balanced intake and output will improve Outcome: Progressing   Problem: Health Behavior/Discharge Planning: Goal: Ability to identify and utilize available resources and services will improve Outcome: Progressing Goal: Ability to manage health-related needs will improve Outcome: Progressing   Problem: Metabolic: Goal: Ability to maintain appropriate glucose levels will improve Outcome: Progressing   Problem: Nutritional: Goal: Maintenance of adequate nutrition will improve Outcome: Progressing Goal: Progress toward achieving an optimal weight will improve Outcome: Progressing   Problem: Skin Integrity: Goal: Risk for impaired skin integrity will decrease Outcome: Progressing   Problem: Tissue Perfusion: Goal: Adequacy of tissue perfusion will improve Outcome: Progressing   Problem: Education: Goal: Knowledge of General Education information will improve Description: Including pain rating scale, medication(s)/side effects and non-pharmacologic comfort measures Outcome: Progressing   Problem: Health Behavior/Discharge Planning: Goal: Ability to manage health-related needs will improve Outcome: Progressing   Problem: Clinical Measurements: Goal: Ability to maintain clinical measurements within normal limits will improve Outcome: Progressing Goal: Will remain free from infection Outcome: Progressing Goal: Diagnostic test results will improve Outcome: Progressing Goal: Respiratory complications will improve Outcome: Progressing Goal: Cardiovascular complication will be avoided Outcome: Progressing   Problem: Activity: Goal: Risk for activity intolerance will decrease Outcome: Progressing   Problem: Nutrition: Goal:  Adequate nutrition will be maintained Outcome: Progressing   Problem: Coping: Goal: Level of anxiety will decrease Outcome: Progressing   Problem: Elimination: Goal: Will not experience complications related to bowel motility Outcome: Progressing Goal: Will not experience complications related to urinary retention Outcome: Progressing   Problem: Pain Managment: Goal: General experience of comfort will improve and/or be controlled Outcome: Progressing   Problem: Safety: Goal: Ability to remain free from injury will improve Outcome: Progressing   Problem: Skin Integrity: Goal: Risk for impaired skin integrity will decrease Outcome: Progressing   Problem: Education: Goal: Ability to demonstrate management of disease process will improve Outcome: Progressing Goal: Ability to verbalize understanding of medication therapies will improve Outcome: Progressing Goal: Individualized Educational Video(s) Outcome: Progressing   Problem: Activity: Goal: Capacity to carry out activities will improve Outcome: Progressing   Problem: Cardiac: Goal: Ability to achieve and maintain adequate cardiopulmonary perfusion will improve Outcome: Progressing

## 2023-10-02 NOTE — Progress Notes (Signed)
 Rounding Note    Patient Name: Alexis Farmer Date of Encounter: 10/02/2023  [facility] Cardiologist: Julien Nordmann, MD   Subjective   Patient reports ongoing shortness of breath, although improved from start of admission. Remains grossly volume overloaded on exam. BP elevated. Kidney function improving with diuresis.   Inpatient Medications    Scheduled Meds:  aspirin EC  81 mg Oral Once per day on Monday Thursday   carvedilol  25 mg Oral BID WC   cloNIDine  0.1 mg Oral Once   cloNIDine  0.2 mg Oral BID   enoxaparin (LOVENOX) injection  0.5 mg/kg Subcutaneous Q24H   furosemide  40 mg Intravenous BID   hydrALAZINE  100 mg Oral Q8H   insulin aspart  0-15 Units Subcutaneous TID WC   insulin glargine-yfgn  30 Units Subcutaneous Daily   potassium chloride  40 mEq Oral Once   rosuvastatin  20 mg Oral Daily   Continuous Infusions:  PRN Meds: acetaminophen **OR** acetaminophen, benzonatate, hydrALAZINE, HYDROcodone-acetaminophen, ipratropium-albuterol, LORazepam, melatonin, morphine injection, ondansetron **OR** ondansetron (ZOFRAN) IV   Vital Signs    Vitals:   10/01/23 2304 10/02/23 0500 10/02/23 0505 10/02/23 0817  BP: (!) 173/49  (!) 186/93 (!) 200/74  Pulse: 72  72 78  Resp: 20  20   Temp: 99.5 F (37.5 C)  99.4 F (37.4 C) 98.5 F (36.9 C)  TempSrc:      SpO2: 100%  97% 100%  Weight:  132.5 kg    Height:        Intake/Output Summary (Last 24 hours) at 10/02/2023 1004 Last data filed at 10/01/2023 2300 Gross per 24 hour  Intake 240 ml  Output 700 ml  Net -460 ml      10/02/2023    5:00 AM 10/01/2023    5:15 AM 09/30/2023    1:43 AM  Last 3 Weights  Weight (lbs) 292 lb 1.8 oz 284 lb 2.8 oz 288 lb  Weight (kg) 132.5 kg 128.9 kg 130.636 kg      Telemetry    Sinus rhythm  - Personally Reviewed  Physical Exam   GEN: No acute distress.   Neck: Difficult to assess JVD Cardiac: RRR, no murmurs, rubs, or gallops.  Respiratory:  Diminished GI: Soft, nontender, non-distended  MS: 1+ LE edema; No deformity. Neuro:  Nonfocal  Psych: Normal affect   Labs    High Sensitivity Troponin:   Recent Labs  Lab 09/29/23 1836  TROPONINIHS 31*     Chemistry Recent Labs  Lab 09/29/23 1836 10/01/23 0826 10/02/23 0754  NA 135 137 136  K 4.4 3.8 3.4*  CL 101 103 103  CO2 26 28 27   GLUCOSE 221* 175* 79  BUN 27* 27* 26*  CREATININE 1.83* 1.89* 1.73*  CALCIUM 9.0 8.9 8.6*  PROT 7.0  --   --   ALBUMIN 2.8*  --   --   AST 42*  --   --   ALT 24  --   --   ALKPHOS 54  --   --   BILITOT 1.1  --   --   GFRNONAA 27* 26* 29*  ANIONGAP 8 6 6     Lipids No results for input(s): "CHOL", "TRIG", "HDL", "LABVLDL", "LDLCALC", "CHOLHDL" in the last 168 hours.  Hematology Recent Labs  Lab 09/29/23 1836 10/01/23 0826 10/02/23 0754  WBC 7.8 7.3 7.1  RBC 4.01 3.77* 3.59*  HGB 11.6* 10.9* 10.4*  HCT 35.9* 35.0* 31.9*  MCV 89.5 92.8 88.9  MCH 28.9 28.9 29.0  MCHC 32.3 31.1 32.6  RDW 14.7 14.9 14.6  PLT 670* 628* 571*   Thyroid No results for input(s): "TSH", "FREET4" in the last 168 hours.  BNP Recent Labs  Lab 09/29/23 1836  BNP 784.0*    DDimer No results for input(s): "DDIMER" in the last 168 hours.   Radiology   DG Chest Farmer 1 View Result Date: 09/29/2023 IMPRESSION: Findings suggest CHF. Electronically Signed   By: Sydell Eva M.D.   On: 09/29/2023 19:25   Cardiac Studies   09/30/2023 Echo complete 1. Left ventricular ejection fraction, by estimation, is 60 to 65%. The  left ventricle has normal function. The left ventricle has no regional  wall motion abnormalities. There is moderate left ventricular hypertrophy.  Left ventricular diastolic  parameters are consistent with Grade I diastolic dysfunction (impaired  relaxation). Elevated left atrial pressure.   2. Right ventricular systolic function is normal. The right ventricular  size is normal. There is severely elevated pulmonary artery systolic   pressure. The estimated right ventricular systolic pressure is 80.0 mmHg.   3. Right atrial size was mildly dilated.   4. The mitral valve is normal in structure. Trivial mitral valve  regurgitation. No evidence of mitral stenosis.   5. The aortic valve is tricuspid. Aortic valve regurgitation is not  visualized. No aortic stenosis is present.   6. The inferior vena cava is dilated in size with <50% respiratory  variability, suggesting right atrial pressure of 15 mmHg.   Patient Profile     Alexis Farmer is a 83 y.o. female with a hx of arthritis, anxiety, essential hypertension, venous insufficiency/chronic lower extremity edema, mixed hyperlipidemia, uncontrolled type 2 diabetes, CKD stage IIIb, morbid obesity, chronic diastolic heart failure, and peripheral artery disease who is being seen for the continued evaluation of acute on chronic diastolic heart failure.  Assessment & Plan    Acute on chronic HFpEF - Weight significantly up from baseline - Appears volume overloaded on exam - Echo this admission with EF 60 to 65%, no TWMA, mod LVH, G1DD, RA pressure 15 mmHg - I/Os poorly recorded, weight down 6 lbs since yesterday - Continue IV Lasix 40 mg twice daily - Continue to monitor kidney function, strict I's and O's, and daily weights with ongoing diuresis - Will transition from nebivolol to carvedilol 25 mg twice daily - Spironolactone and valsartan currently held due to AKI, would favor reintroduction prior to discharge - May benefit from RHC in the future - Consider consultation by AHF  Hypertension - Blood pressure remains elevated - Continue hydralazine 100 mg 3 times daily - Transition to carvedilol 25 mg twice daily as above - Increase clonidine to 0.2 mg twice daily  T2DM - Longstanding history of uncontrolled type 2 diabetes - Management per IM  AKI on CKD IIIb - Kidney function improving with ongoing diuresis - Continue to avoid nephrotoxic agents - Continue  to monitor  For questions or updates, please contact [facility] Please consult [email or web address] for contact info under        Signed, Brodie Cannon, PA-C  10/02/2023, 10:04 AM

## 2023-10-03 ENCOUNTER — Ambulatory Visit: Admitting: Family Medicine

## 2023-10-03 ENCOUNTER — Telehealth (HOSPITAL_COMMUNITY): Payer: Self-pay | Admitting: Pharmacy Technician

## 2023-10-03 ENCOUNTER — Other Ambulatory Visit (HOSPITAL_COMMUNITY): Payer: Self-pay

## 2023-10-03 DIAGNOSIS — Z794 Long term (current) use of insulin: Secondary | ICD-10-CM | POA: Diagnosis not present

## 2023-10-03 DIAGNOSIS — E1121 Type 2 diabetes mellitus with diabetic nephropathy: Secondary | ICD-10-CM | POA: Diagnosis not present

## 2023-10-03 DIAGNOSIS — N1832 Chronic kidney disease, stage 3b: Secondary | ICD-10-CM | POA: Diagnosis not present

## 2023-10-03 DIAGNOSIS — I5033 Acute on chronic diastolic (congestive) heart failure: Secondary | ICD-10-CM | POA: Diagnosis not present

## 2023-10-03 DIAGNOSIS — I10 Essential (primary) hypertension: Secondary | ICD-10-CM | POA: Diagnosis not present

## 2023-10-03 LAB — BASIC METABOLIC PANEL WITH GFR
Anion gap: 7 (ref 5–15)
BUN: 25 mg/dL — ABNORMAL HIGH (ref 8–23)
CO2: 28 mmol/L (ref 22–32)
Calcium: 8.6 mg/dL — ABNORMAL LOW (ref 8.9–10.3)
Chloride: 101 mmol/L (ref 98–111)
Creatinine, Ser: 1.64 mg/dL — ABNORMAL HIGH (ref 0.44–1.00)
GFR, Estimated: 31 mL/min — ABNORMAL LOW (ref >60)
Glucose, Bld: 110 mg/dL — ABNORMAL HIGH (ref 70–99)
Potassium: 3.7 mmol/L (ref 3.5–5.1)
Sodium: 136 mmol/L (ref 135–145)

## 2023-10-03 LAB — GLUCOSE, CAPILLARY
Glucose-Capillary: 96 mg/dL (ref 70–99)
Glucose-Capillary: 168 mg/dL — ABNORMAL HIGH (ref 70–99)
Glucose-Capillary: 204 mg/dL — ABNORMAL HIGH (ref 70–99)
Glucose-Capillary: 192 mg/dL — ABNORMAL HIGH (ref 70–99)

## 2023-10-03 LAB — CBC
HCT: 31.6 % — ABNORMAL LOW (ref 36.0–46.0)
Hemoglobin: 10.1 g/dL — ABNORMAL LOW (ref 12.0–15.0)
MCH: 28.9 pg (ref 26.0–34.0)
MCHC: 32 g/dL (ref 30.0–36.0)
MCV: 90.5 fL (ref 80.0–100.0)
Platelets: 532 10*3/uL — ABNORMAL HIGH (ref 150–400)
RBC: 3.49 MIL/uL — ABNORMAL LOW (ref 3.87–5.11)
RDW: 14.7 % (ref 11.5–15.5)
WBC: 6.8 10*3/uL (ref 4.0–10.5)
nRBC: 0 % (ref 0.0–0.2)

## 2023-10-03 LAB — MAGNESIUM: Magnesium: 1.9 mg/dL (ref 1.7–2.4)

## 2023-10-03 MED ORDER — INSULIN GLARGINE-YFGN 100 UNIT/ML ~~LOC~~ SOLN
10.0000 [IU] | Freq: Every day | SUBCUTANEOUS | Status: DC
  Administered 2023-10-04: 10 [IU] via SUBCUTANEOUS
  Filled 2023-10-03 (×2): qty 0.1

## 2023-10-03 MED ORDER — SACUBITRIL-VALSARTAN 24-26 MG PO TABS
1.0000 | ORAL_TABLET | Freq: Two times a day (BID) | ORAL | Status: DC
Start: 2023-10-03 — End: 2023-10-04
  Administered 2023-10-03: 1 via ORAL
  Filled 2023-10-03: qty 1

## 2023-10-03 MED ORDER — CARVEDILOL 12.5 MG PO TABS: 12.5000 mg | ORAL_TABLET | Freq: Two times a day (BID) | ORAL | Status: DC

## 2023-10-03 MED ORDER — ISOSORBIDE MONONITRATE ER 30 MG PO TB24
30.0000 mg | ORAL_TABLET | Freq: Two times a day (BID) | ORAL | Status: DC
  Administered 2023-10-03: 30 mg via ORAL
  Filled 2023-10-03: qty 1

## 2023-10-03 MED ORDER — CARVEDILOL 6.25 MG PO TABS
6.2500 mg | ORAL_TABLET | Freq: Two times a day (BID) | ORAL | Status: DC
  Administered 2023-10-03 – 2023-10-15 (×23): 6.25 mg via ORAL
  Filled 2023-10-03 (×24): qty 1

## 2023-10-03 MED ORDER — FUROSEMIDE 10 MG/ML IJ SOLN
30.0000 mg/h | INTRAVENOUS | Status: DC
  Administered 2023-10-03: 8 mg/h via INTRAVENOUS
  Administered 2023-10-04 – 2023-10-08 (×8): 20 mg/h via INTRAVENOUS
  Administered 2023-10-08 – 2023-10-10 (×5): 30 mg/h via INTRAVENOUS
  Filled 2023-10-03 (×17): qty 20

## 2023-10-03 MED ORDER — SPIRONOLACTONE 25 MG PO TABS
25.0000 mg | ORAL_TABLET | Freq: Every day | ORAL | Status: DC
  Administered 2023-10-03 – 2023-10-05 (×3): 25 mg via ORAL
  Filled 2023-10-03 (×3): qty 1

## 2023-10-03 MED ORDER — TRAZODONE HCL 50 MG PO TABS
50.0000 mg | ORAL_TABLET | Freq: Every evening | ORAL | Status: DC | PRN
  Administered 2023-10-03 – 2023-10-11 (×9): 50 mg via ORAL
  Filled 2023-10-03 (×9): qty 1

## 2023-10-03 NOTE — TOC Progression Note (Signed)
 Transition of Care [facility]) - Progression Note    Patient Details  Name: Alexis Farmer MRN: [id number] Date of Birth: [birth year]  [facility]) CM/SW Contact  Odilia Bennett, LCSW Phone Number: 10/03/2023, 12:04 PM  Clinical Narrative:  Readmission prevention screen complete. CSW met with patient. No supports at bedside. CSW introduced role and explained that discharge planning would be discussed. PCP is Valli Gaw, MD. She uses a transport van to get to appointments. Pharmacy is [facility] in [place]. She reports her medications are expensive just because there are so many of them. She is active with [facility] for PT and OT. She has a RW, elevated toilet, and shower chair at home. Patient stated PT is going to recommend SNF placement and she is agreeable to this. CSW called granddaughter. She voiced frustration with patient's noncompliance at home. She plans to discuss with patient. Will address discharge plan closer to discharge. No further concerns. CSW will continue to follow patient for support and facilitate return home once stable.   Expected Discharge Plan: Home w Home Health Services Barriers to Discharge: Continued Medical Work up  Expected Discharge Plan and Services       Living arrangements for the past 2 months: Single Family Home                           HH Arranged: PT, OT HH Agency: [facility] Date HH Agency Contacted: 10/02/23   Representative spoke with at [facility] Agency: Shaun   Social Determinants of Health (SDOH) Interventions SDOH Screenings   Food Insecurity: No Food Insecurity (10/03/2023)  Recent Concern: Food Insecurity - Food Insecurity Present (10/03/2023)  Housing: Low Risk  (10/03/2023)  Transportation Needs: No Transportation Needs (10/03/2023)  Recent Concern: Transportation Needs - Unmet Transportation Needs (09/23/2023)  Utilities: Not At Risk (09/30/2023)  Alcohol Screen: Low Risk  (11/29/2022)  Depression  (PHQ2-9): High Risk (09/23/2023)  Financial Resource Strain: Medium Risk (10/03/2023)  Physical Activity: Inactive (10/03/2023)  Social Connections: Moderately Integrated (09/30/2023)  Stress: Stress Concern Present (11/29/2022)  Tobacco Use: Low Risk  (09/30/2023)    Readmission Risk Interventions    10/03/2023   12:03 PM  Readmission Risk Prevention Plan  Transportation Screening Complete  PCP or Specialist Appt within 3-5 Days Complete  HRI or Home Care Consult Complete  Social Work Consult for Recovery Care Planning/Counseling Complete  Palliative Care Screening Not Applicable  Medication Review Oceanographer) Complete

## 2023-10-03 NOTE — Progress Notes (Signed)
 Cardiology Progress Note   Patient Name: Alexis Farmer Date of Encounter: 10/03/2023  Primary Cardiologist: Julien Nordmann, MD  Subjective   Bothered by frequent cough and ongoing leg swelling.  Discussed w/ grand-daughter over the phone.  Long h/o lymphedema w/ prior rec for outpt vascular eval.  Pt eats a lot of processed foods and soup.  Also sits most of the day.  Has never tried compression hose. Objective   Inpatient Medications    Scheduled Meds:  aspirin EC  81 mg Oral Once per day on Monday Thursday   carvedilol  25 mg Oral BID WC   cloNIDine  0.2 mg Oral BID   enoxaparin (LOVENOX) injection  0.5 mg/kg Subcutaneous Q24H   furosemide  40 mg Intravenous BID   hydrALAZINE  100 mg Oral Q8H   insulin aspart  0-15 Units Subcutaneous TID WC   insulin glargine-yfgn  10 Units Subcutaneous Daily   isosorbide mononitrate  30 mg Oral BID   rosuvastatin  20 mg Oral Daily   spironolactone  25 mg Oral Daily   Continuous Infusions:  PRN Meds: acetaminophen **OR** acetaminophen, benzonatate, hydrALAZINE, HYDROcodone-acetaminophen, ipratropium-albuterol, LORazepam, morphine injection, ondansetron **OR** ondansetron (ZOFRAN) IV, traZODone   Vital Signs    Vitals:   10/02/23 2333 10/03/23 0530 10/03/23 0543 10/03/23 0824  BP: (!) 166/53  (!) 194/72 (!) 201/67  Pulse: 66  62 62  Resp: 18  20   Temp: 99.4 F (37.4 C)  98.7 F (37.1 C) 98.5 F (36.9 C)  TempSrc:      SpO2: 96%  98% 96%  Weight:  135.2 kg    Height:        Intake/Output Summary (Last 24 hours) at 10/03/2023 1037 Last data filed at 10/03/2023 1000 Gross per 24 hour  Intake 116 ml  Output 500 ml  Net -384 ml   Filed Weights   10/01/23 0515 10/02/23 0500 10/03/23 0530  Weight: 128.9 kg 132.5 kg 135.2 kg    Physical Exam   GEN: obese, in no acute distress.  HEENT: Grossly normal.  Neck: Supple, no JVD, carotid bruits, or masses. Cardiac: RRR, no murmurs, rubs, or gallops. No clubbing, cyanosis, 3+  bilat LE edema.  Radials 2+, DP/PT 2+ and equal bilaterally.  Respiratory:  Respirations regular and unlabored, clear to auscultation bilaterally. GI: Soft, nontender, nondistended, BS + x 4. MS: no deformity or atrophy. Skin: warm and dry, no rash. Neuro:  Strength and sensation are intact. Psych: AAOx3.  Normal affect.  Labs    Chemistry Recent Labs  Lab 09/29/23 1836 10/01/23 0826 10/02/23 0754 10/03/23 0415  NA 135 137 136 136  K 4.4 3.8 3.4* 3.7  CL 101 103 103 101  CO2 26 28 27 28   GLUCOSE 221* 175* 79 110*  BUN 27* 27* 26* 25*  CREATININE 1.83* 1.89* 1.73* 1.64*  CALCIUM 9.0 8.9 8.6* 8.6*  PROT 7.0  --   --   --   ALBUMIN 2.8*  --   --   --   AST 42*  --   --   --   ALT 24  --   --   --   ALKPHOS 54  --   --   --   BILITOT 1.1  --   --   --   GFRNONAA 27* 26* 29* 31*  ANIONGAP 8 6 6 7      Hematology Recent Labs  Lab 10/01/23 0826 10/02/23 0754 10/03/23 0415  WBC 7.3 7.1 6.8  RBC 3.77* 3.59* 3.49*  HGB 10.9* 10.4* 10.1*  HCT 35.0* 31.9* 31.6*  MCV 92.8 88.9 90.5  MCH 28.9 29.0 28.9  MCHC 31.1 32.6 32.0  RDW 14.9 14.6 14.7  PLT 628* 571* 532*    Cardiac Enzymes  Recent Labs  Lab 09/29/23 1836  TROPONINIHS 31*      BNP    Component Value Date/Time   BNP 784.0 (H) 09/29/2023 1836   Lipids  Lab Results  Component Value Date   CHOL 129 12/13/2021   HDL 45.40 12/13/2021   LDLCALC 40 12/15/2020   LDLDIRECT 46.0 12/13/2021   TRIG 215.0 (H) 12/13/2021   CHOLHDL 3 12/13/2021    HbA1c  Lab Results  Component Value Date   HGBA1C 9.7 08/01/2023    Radiology    DG Chest Port 1 View Result Date: 09/29/2023 CLINICAL DATA:  sob EXAM: PORTABLE CHEST - 1 VIEW COMPARISON:  08/04/2023. FINDINGS: Cardiac silhouette is prominent. There is pulmonary interstitial prominence with vascular congestion. No focal consolidation. No pneumothorax. Small pleural effusions. Calcified aorta. Thoracic degenerative changes. IMPRESSION: Findings suggest CHF.  Electronically Signed   By: Sydell Eva M.D.   On: 09/29/2023 19:25     Telemetry    RSR, 60's - Personally Reviewed  Cardiac Studies   09/30/2023 Echo complete 1. Left ventricular ejection fraction, by estimation, is 60 to 65%. The  left ventricle has normal function. The left ventricle has no regional  wall motion abnormalities. There is moderate left ventricular hypertrophy.  Left ventricular diastolic  parameters are consistent with Grade I diastolic dysfunction (impaired  relaxation). Elevated left atrial pressure.   2. Right ventricular systolic function is normal. The right ventricular  size is normal. There is severely elevated pulmonary artery systolic  pressure. The estimated right ventricular systolic pressure is 80.0 mmHg.   3. Right atrial size was mildly dilated.   4. The mitral valve is normal in structure. Trivial mitral valve  regurgitation. No evidence of mitral stenosis.   5. The aortic valve is tricuspid. Aortic valve regurgitation is not  visualized. No aortic stenosis is present.   6. The inferior vena cava is dilated in size with <50% respiratory  variability, suggesting right atrial pressure of 15 mmHg.   Patient Profile     Alexis Farmer is a 83 y.o. female with a hx of arthritis, anxiety, essential hypertension, venous insufficiency/chronic lower extremity edema, mixed hyperlipidemia, uncontrolled type 2 diabetes, CKD stage IIIb, morbid obesity, chronic diastolic heart failure, and peripheral artery disease who is being seen for the continued evaluation of acute on chronic diastolic heart failure.   Assessment & Plan    1.  Acute on chronic HFpEF/pulmonary hypertension:  EF 60-65% by echo this admission w/ mod LVH and GrI DD.  RVSP 80 mmHg. RA est 15 mmHg.  She cont to have lower ext swelling, cough, and dyspnea.  I/O inaccurate (no intake recorded yesterday).  Bedscale wt up 3 kg from yesterday - ? Accuracy.  Dry wt last year was 115 - 117 kg.  Now  trending in 130's.  Pt cont to cough and has ongoing, significant lower ext edema.  Discussed w/ grand-dtr this AM.  Previously told they need to f/u w/ vascular surgery r/t lymphedema, but has not.  Eats a lot of processed food and sits most of the day.  Will need outpt vascular f/u.  Renal fxn improved compared to yesterday.  BP remains elevated.  Adding back spiro 25 daily.  Transitioning bolus dose  Lasix to infusion 8 mg/h.  Follow-up lab work in AM.  2.  Malignant hypertension: Blood pressure remains markedly elevated with systolics in the 200 range.  Adding back spironolactone and adding Imdur 30 mg twice daily.  Also titrating Lasix.  Continue carvedilol, hydralazine, and clonidine  3.  Stage III chronic kidney disease: Creatinine stable this morning at 1.64.  Follow-up with more aggressive diuresis.  4.  Type 2 diabetes mellitus: Poorly controlled with an A1c of 9.7.  Insulin management per medicine team.  5.  Hypokalemia: Potassium stable at 3.7 this morning.  Follow-up in AM.  Signed, Laneta Pintos, NP  10/03/2023, 10:37 AM    For questions or updates, please contact   Please consult [email or web address] for contact info under Cardiology/STEMI.

## 2023-10-03 NOTE — Care Management Important Message (Signed)
 Important Message  Patient Details  Name: Alexis Farmer MRN: [id number] Date of Birth: Apr 20, 1941   Important Message Given:  Yes - Medicare IM     Anise Kerns 10/03/2023, 1:43 PM

## 2023-10-03 NOTE — Evaluation (Signed)
 Physical Therapy Evaluation Patient Details Name: Alexis Farmer MRN: [id number] DOB: 1941/05/23 Today's Date: 10/03/2023  History of Present Illness  Alexis Farmer is a 83 y.o. female with medical history significant for monoclonal B-cell lymphoma, stage IIIb CKD, DM, HTN, HFpEF, chronic pain, obesity, immobility , hospitalized in February with a UTI, and in November with RSV who presents by EMS with a 2-week history of shortness of breath.  She states since her RSV infection her breathing never got quite right however over the past few days she has had worsening shortness of breath, with orthopnea and lower extremity edema. Diagnositc studies completed. Pt admitted for Acute on chronic CHF  Clinical Impression  Pt received in chair with NT by her side. Pt refused to eat her lunch and was agitated due to her current health. Pt agreeable to PT interventions. Pt Lives her Granddaughter who works form home. Pt's PLOF is Ind with household level activities using FWW. Enjoys to stay in her recliner at home. PT assessment revealed, pt has severe generalized edema in entire body contributing to increased difficulties with movement. Pt needs Mod assist for STS transfer and mod to min assit to ambulate 5 ft. Pt fatigues easily evident by uncontrolled stand to sit by the end of the short ambulation. Pt demonstrates generalized weakness, heavily reliant on FWW, poor endurance. Pt will benefit from Continued PT in acute and beyond.       If plan is discharge home, recommend the following: A lot of help with walking and/or transfers;A lot of help with bathing/dressing/bathroom;Assistance with cooking/housework;Direct supervision/assist for medications management;Assist for transportation   Can travel by private vehicle   No    Equipment Recommendations None recommended by PT  Recommendations for Other Services       Functional Status Assessment Patient has had a recent decline in their functional  status and demonstrates the ability to make significant improvements in function in a reasonable and predictable amount of time.     Precautions / Restrictions Precautions Precautions: Fall Restrictions Weight Bearing Restrictions Per Provider Order: No      Mobility  Bed Mobility               General bed mobility comments: NT, pt received in chair.    Transfers Overall transfer level: Needs assistance Equipment used: Rolling walker (2 wheels) Transfers: Sit to/from Stand Sit to Stand: Mod assist           General transfer comment: stand to sit wihtout control.    Ambulation/Gait Ambulation/Gait assistance: Mod assist, Min assist Gait Distance (Feet): 5 Feet Assistive device: Rolling walker (2 wheels) Surveyor, quantity) Gait Pattern/deviations: Step-through pattern, Decreased step length - right, Decreased step length - left, Wide base of support Gait velocity: dec     General Gait Details: Forward flexed with heavy relianc eon FWW, fatigues easily.  Stairs            Wheelchair Mobility     Tilt Bed    Modified Rankin (Stroke Patients Only)       Balance Overall balance assessment: Needs assistance     Sitting balance - Comments: in chair Ind without B feet suported   Standing balance support: Bilateral upper extremity supported Standing balance-Leahy Scale: Good Standing balance comment: No LOB noted but fatigues easily and relies on FWW heavily fro balance  Pertinent Vitals/Pain Pain Assessment Pain Assessment: No/denies pain    Home Living Family/patient expects to be discharged to:: Private residence Living Arrangements: Other relatives (grand daughter works from home) Available Help at Discharge: Available PRN/intermittently Type of Home: House Home Access: Level entry       Home Layout: One level Home Equipment: Agricultural consultant (2 wheels);Rollator (4 wheels);BSC/3in1      Prior Function  Prior Level of Function : Needs assist             Mobility Comments: Mod I with household level activity with walker. ADLs Comments: requires assistance     Extremity/Trunk Assessment   Upper Extremity Assessment Upper Extremity Assessment: Defer to OT evaluation    Lower Extremity Assessment Lower Extremity Assessment: Generalized weakness       Communication   Communication Communication: No apparent difficulties    Cognition Arousal: Alert Behavior During Therapy: Agitated, Impulsive   PT - Cognitive impairments: No apparent impairments                         Following commands: Intact       Cueing Cueing Techniques: Verbal cues     General Comments      Exercises     Assessment/Plan    PT Assessment Patient needs continued PT services  PT Problem List Decreased strength;Decreased activity tolerance;Decreased balance;Decreased mobility;Cardiopulmonary status limiting activity;Obesity       PT Treatment Interventions Gait training;Functional mobility training;Therapeutic activities;Therapeutic exercise;Balance training;Neuromuscular re-education;Patient/family education    PT Goals (Current goals can be found in the Care Plan section)  Acute Rehab PT Goals Patient Stated Goal: " I am very angry and stressed becasue of my heart health. Want to feel better." PT Goal Formulation: With patient Time For Goal Achievement: 10/17/23 Potential to Achieve Goals: Fair    Frequency Min 2X/week     Co-evaluation               AM-PAC PT "6 Clicks" Mobility  Outcome Measure Help needed turning from your back to your side while in a flat bed without using bedrails?: A Lot Help needed moving from lying on your back to sitting on the side of a flat bed without using bedrails?: A Lot Help needed moving to and from a bed to a chair (including a wheelchair)?: A Lot Help needed standing up from a chair using your arms (e.g., wheelchair or bedside  chair)?: A Lot Help needed to walk in hospital room?: A Lot Help needed climbing 3-5 steps with a railing? : Total 6 Click Score: 11    End of Session Equipment Utilized During Treatment: Gait belt Activity Tolerance: Patient limited by fatigue;Treatment limited secondary to agitation;Treatment limited secondary to medical complications (Comment) (also due to severe fluid overload) Patient left: in chair;with call bell/phone within reach;with chair alarm set Nurse Communication: Mobility status PT Visit Diagnosis: Muscle weakness (generalized) (M62.81);Difficulty in walking, not elsewhere classified (R26.2)    Time: 1209-1224 PT Time Calculation (min) (ACUTE ONLY): 15 min   Charges:       PT General Charges $$ ACUTE PT VISIT: 1 Visit         Rushawn Capshaw PT DPT 1:35 PM,10/03/23

## 2023-10-03 NOTE — Evaluation (Signed)
 Occupational Therapy Evaluation Patient Details Name: Alexis Farmer MRN: [id number] DOB: 20-Feb-1941 Today's Date: 10/03/2023   History of Present Illness   Alexis Farmer is a 83 y.o. female with medical history significant for monoclonal B-cell lymphoma, stage IIIb CKD, DM, HTN, HFpEF, chronic pain, obesity, immobility , hospitalized in February with a UTI, and in November with RSV who presents by EMS with a 2-week history of shortness of breath.  She states since her RSV infection her breathing never got quite right however over the past few days she has had worsening shortness of breath, with orthopnea and lower extremity edema. Diagnositc studies completed. Pt admitted for Acute on chronic CHF     Clinical Impressions Patient presenting with decreased Ind in self care,balance, functional mobility/transfers, endurance, and safety awareness. Patient reports living at home with family. Her granddaughter works from home and is able to provide some assistance. Pt ambulates with RW and needs some assistance for self care tasks. Pt's family performs all IADL tasks. Pt needing assistance to don B shoes and stands with min A and ambulates with RW 15' and to recliner chair. Pt does endorse fatigue with activity. Set up A while seated to wash face and hands.Patient will benefit from acute OT to increase overall independence in the areas of ADLs, functional mobility, and safety awareness in order to safely discharge.     If plan is discharge home, recommend the following:   A lot of help with walking and/or transfers;A lot of help with bathing/dressing/bathroom;Assistance with cooking/housework;Assist for transportation;Help with stairs or ramp for entrance     Functional Status Assessment   Patient has had a recent decline in their functional status and demonstrates the ability to make significant improvements in function in a reasonable and predictable amount of time.     Equipment  Recommendations   None recommended by OT      Precautions/Restrictions   Precautions Precautions: Fall     Mobility Bed Mobility Overal bed mobility: Needs Assistance Bed Mobility: Supine to Sit     Supine to sit: Min assist          Transfers Overall transfer level: Needs assistance Equipment used: Rolling walker (2 wheels) Transfers: Sit to/from Stand Sit to Stand: Min assist                  Balance Overall balance assessment: Needs assistance         Standing balance support: Bilateral upper extremity supported Standing balance-Leahy Scale: Good                             ADL either performed or assessed with clinical judgement   ADL Overall ADL's : Needs assistance/impaired     Grooming: Wash/dry hands;Wash/dry face;Sitting;Set up;Supervision/safety                   Toilet Transfer: Minimal assistance Toilet Transfer Details (indicate cue type and reason): simulated                 Vision Patient Visual Report: No change from baseline              Pertinent Vitals/Pain Pain Assessment Pain Assessment: No/denies pain     Extremity/Trunk Assessment Upper Extremity Assessment Upper Extremity Assessment: Generalized weakness   Lower Extremity Assessment Lower Extremity Assessment: Generalized weakness       Communication Communication Communication: No apparent difficulties   Cognition Arousal: Alert Behavior During  Therapy: WFL for tasks assessed/performed Cognition: No apparent impairments                               Following commands: Intact       Cueing  General Comments   Cueing Techniques: Verbal cues              Home Living Family/patient expects to be discharged to:: Private residence Living Arrangements: Other relatives (granddaughter works from home) Available Help at Discharge: Available PRN/intermittently Type of Home: House Home Access: Level entry      Home Layout: One level     Bathroom Shower/Tub: Sponge bathes at baseline;Tub/shower unit   Bathroom Toilet: Standard     Home Equipment: Agricultural consultant (2 wheels);Rollator (4 wheels);BSC/3in1          Prior Functioning/Environment Prior Level of Function : Needs assist             Mobility Comments: Mod I with household level activity with walker. ADLs Comments: Pt reports granddaughter assists with shower 2x/wk and aide come 3x/wk to assist as well.    OT Problem List: Decreased strength;Decreased activity tolerance;Decreased safety awareness;Impaired balance (sitting and/or standing);Decreased knowledge of use of DME or AE   OT Treatment/Interventions: Self-care/ADL training;Therapeutic exercise;Therapeutic activities;Energy conservation;DME and/or AE instruction;Patient/family education;Balance training      OT Goals(Current goals can be found in the care plan section)   Acute Rehab OT Goals Patient Stated Goal: to go to rehab and get stronger OT Goal Formulation: With patient Time For Goal Achievement: 10/17/23 Potential to Achieve Goals: Fair ADL Goals Pt Will Perform Grooming: with modified independence;standing Pt Will Perform Lower Body Dressing: with modified independence;sit to/from stand Pt Will Transfer to Toilet: with modified independence;ambulating Pt Will Perform Toileting - Clothing Manipulation and hygiene: with modified independence;sit to/from stand   OT Frequency:  Min 2X/week       AM-PAC OT "6 Clicks" Daily Activity     Outcome Measure Help from another person eating meals?: None Help from another person taking care of personal grooming?: A Little Help from another person toileting, which includes using toliet, bedpan, or urinal?: A Little Help from another person bathing (including washing, rinsing, drying)?: A Little Help from another person to put on and taking off regular upper body clothing?: A Little Help from another person to  put on and taking off regular lower body clothing?: A Little 6 Click Score: 19   End of Session Equipment Utilized During Treatment: Rolling walker (2 wheels) Nurse Communication: Mobility status  Activity Tolerance: Patient tolerated treatment well Patient left: in bed;with call bell/phone within reach;with bed alarm set  OT Visit Diagnosis: Unsteadiness on feet (R26.81);Repeated falls (R29.6);Muscle weakness (generalized) (M62.81)                Time: 1610-9604 OT Time Calculation (min): 24 min Charges:  OT General Charges $OT Visit: 1 Visit OT Evaluation $OT Eval Moderate Complexity: 1 Mod OT Treatments $Self Care/Home Management : 8-22 mins  George Kinder, MS, OTR/L , CBIS ascom [phone]  10/03/23, 1:57 PM

## 2023-10-03 NOTE — Progress Notes (Signed)
 Heart Failure Nurse Navigator Progress Note  PCP: Valli Gaw, MD PCP-Cardiologist: Belva Boyden, MD Admission Diagnosis: Shortness of breath Congestive Heart failure, unspecified HF chronicity, unspecified heart failure type [facility]) Admitted from: Home from EMS  Presentation:   Alexis Farmer presented with shortness of breath that started 2 weeks prior. Bilateral pitting edema in both legs and weeping. Frequent cough. Has missed recent doses of her Lasix. Recent history of RSV a few months ago that she felt like she never fully recovered from. Over the past few days her breathing had gotten worse especially while laying flat.  No chest pain or fevers. BNP 784.0. Chest x-ray: cardiac silhouette is prominent with vascular congestion.  ECHO/ LVEF: 60-65%  Clinical Course:  Past Medical History:  Diagnosis Date   Anxiety 09/13/2015   Arthritis    Atypical chest pain 01/15/2021   [place] 10/24/2021   Bradycardia 07/17/2016   Formatting of this note might be different from the original.  Last Assessment & Plan:   Found to be bradycardic at outside hospital. They stopped metoprolol and verapamil and heart rate has been normal since then. Echo appears to have been reassuring at the outside facility. Appears to be doing much better with regards to this. She'll continue to monitor for recurrent symptoms. 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  Last Assessment & Plan:   Continues to have issues with this.  She is following with a specialist and it sounds as though they are planning on an MRI.  Benign exam today.   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 Rudean Corrente of this note might be different from the original.  Dr. Zelda Hickman      Last Assessment & Plan:   Recheck kidney function today.   Constipation 11/17/2019   Cough 01/25/2016   Formatting of this note  might be different from the original.  Last Assessment & Plan:   Minimal nighttime cough. Improves when she washes her pillows consistently. Suspect allergies contributing. She'll monitor.   Diabetes mellitus without complication (HCC)    one elevated reading/ no treatment   Disorder of rotator cuff 06/24/2018   Diverticulitis    Dysuria 03/22/2017   Formatting of this note might be different from the original.  Last Assessment & Plan:   Symptoms concerning for UTI.  Will check urinalysis.   Edema of foot 04/08/2014   Formatting of this note might be different from the original.  Last Assessment & Plan:   Chronic pedal edema. Suspect venous insufficiency. No orthopnea or shortness of breath. Advised elevation of her legs. Consider compression stockings in the future.   Elevated troponin 12/15/2020   Essential hypertension 04/08/2014   Formatting of this note might be different from the original.  Last Assessment & Plan:   Well-controlled on recheck.  Continue current regimen.   Fall 03/26/2018   Gastroenteritis 08/12/2021   Gastrointestinal hemorrhage 08/03/2015   GI bleed    High cholesterol    History of blood transfusion    Hyperglycemia due to type 2 diabetes mellitus (HCC) 03/11/2015   Hypertension    Hypertensive urgency 12/15/2020   Low back pain 04/08/2014   Morbid obesity (HCC) 04/08/2014   Formatting of this note might be different from the original.  Last Assessment & Plan:   Weight is stable. Discussed diet and exercise at length.  Encouraged whatever exercise she can do. Given diet information.   Palpitations 12/15/2020   Peripheral edema 12/22/2019   Postmenopausal bleeding 07/17/2016   Formatting of this note might be different from the original.  Last Assessment & Plan:   Recent D&C. Following with gynecology. Benign findings. Monitor for recurrence.   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  Last Assessment & Plan:   Continues to have issues with this.  She is following with a specialist and it sounds as though they are planning on an MRI.  Benign exam today.   Recurrent falls 08/13/2019   Renal insufficiency    Skin cyst 10/24/2021   Stage 3a chronic kidney disease (HCC) 01/15/2021   Swelling of lower leg 07/19/2016   Vertigo 10/13/2015   Formatting of this note might be different from the original.  Last Assessment & Plan:   No recurrence. Discussed that meclizine is an as needed medication and that she does not need to take this daily. She will continue to monitor.     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  Tobacco Use   Smoking status: Never   Smokeless tobacco: Never  Vaping Use   Vaping status: Never Used  Substance and Sexual Activity   Alcohol use: No   Drug use: No   Sexual activity: Not Currently  Other Topics Concern   Not on file  Social History Narrative   Lives alone    From [place]   Widowed - was married 3 times starting at age 43    hx of seamtress, 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   Social Drivers of Health   Financial Resource Strain: Medium Risk (10/03/2023)   Overall Financial Resource Strain (CARDIA)    Difficulty of Paying Living Expenses: Somewhat hard  Food Insecurity: No Food Insecurity (10/03/2023)   Hunger Vital Sign    Worried About Running Out of Food in the Last Year: Never true    Ran Out of Food in the Last Year: Never true  Recent Concern: Food Insecurity - Food Insecurity Present (10/03/2023)   Hunger Vital Sign     Worried About Running Out of Food in the Last Year: Sometimes true    Ran Out of Food in the Last Year: Never true  Transportation Needs: No Transportation Needs (10/03/2023)   PRAPARE - Administrator, Civil Service (Medical): No    Lack of Transportation (Non-Medical): No  Recent Concern: Transportation Needs - Unmet Transportation Needs (09/23/2023)   PRAPARE - Transportation    Lack of Transportation (Medical): Yes    Lack of Transportation (Non-Medical): Yes  Physical Activity: Inactive (11/29/2022)   Exercise Vital Sign    Days of Exercise per Week: 0 days    Minutes of Exercise per Session: 0 min  Stress: Stress Concern Present (11/29/2022)   [employer] of Occupational Health - Occupational Stress Questionnaire    Feeling of Stress : To some extent  Social Connections: Moderately Integrated (09/30/2023)   Social Connection and Isolation  Panel [NHANES]    Frequency of Communication with Friends and Family: More than three times a week    Frequency of Social Gatherings with Friends and Family: More than three times a week    Attends Religious Services: More than 4 times per year    Active Member of [employer] or Organizations: Yes    Attends Banker Meetings: More than 4 times per year    Marital Status: Widowed   Education Assessment and Provision:  Detailed education and instructions provided on heart failure disease management including the following:  Signs and symptoms of Heart Failure When to call the physician Importance of daily weights Low sodium diet Fluid restriction Medication management Anticipated future follow-up appointments  Patient education given on each of the above topics.  Patient acknowledges understanding via teach back method and acceptance of all instructions.  Education Materials:  "Living Better With Heart Failure" Booklet, HF zone tool, & Daily Weight Tracker Tool.  Patient has scale at home: Yes.  She has some vision  problems and has had some difficultly seeing the numbers on the scale though.  She would try to order one that tells her the number. Patient has pill box at home: She does not use a pill box.    High Risk Criteria for Readmission and/or Poor Patient Outcomes: Heart failure hospital admissions (last 6 months): 1  No Show rate: 3% Difficult social situation: None Demonstrates medication adherence: No Primary Language: English Literacy level: Reading, Writing & Comprehension  Barriers of Care:   Some visional concerns with ability to read the numbers on her scale Sedentary lifestyle-sits most of the day Has difficultly with food preparation so she eats mostly processed foods Transportation can be an issue if she does not have enough time to arrange the ride for the appointment.  Says she likes to know at least a week in advance.   Considerations/Referrals:   Referral made to Heart Failure Pharmacist Stewardship: Yes Referral made to Heart Failure CSW/NCM TOC: No Referral made to Heart & Vascular TOC clinic: Yes  Items for Follow-up on DC/TOC: Daily weights-can't see the numbers on her scale.  She was going to look into getting one that tells her the weight. Diet & Fluid Restrictions-high sodium diet. Medication Compliance-does miss medication doses. Sedentary lifestyle-encourage movement. Continued Heart Failure Education  Celedonio Coil, RN, BSN [facility] Heart Failure Navigator Secure Chat Only

## 2023-10-03 NOTE — Progress Notes (Signed)
 Progress Note   Patient: Alexis Farmer HYQ:[id number] DOB: 1940/07/25 DOA: 09/29/2023     3 DOS: the patient was seen and examined on 10/03/2023   Brief hospital course:  Alexis Farmer is a 83 y.o. female with medical history significant for monoclonal B-cell lymphoma, stage IIIb CKD, DM, HTN, HFpEF, chronic pain, obesity, immobility , hospitalized in February with a UTI, and in November with RSV who presents by EMS with a 2-week history of shortness of breath.  She states since her RSV infection her breathing never got quite right however over the past few days she has had worsening shortness of breath, with orthopnea and lower extremity edema. ED course and data review: SBP in the 200s HB at 11.6 and platelets 670. Creatinine slightly above baseline at 1.83 Glucose 221 EKG, personally viewed and interpreted showing sinus at 71 with LVH and no acute ST-T wave changes. Chest x-ray with findings suggestive of CHF Patient given IV Lasix 60 mg Hospitalist consulted for admission.      Assessment and Plan:  * Acute on chronic heart failure with preserved ejection fraction (HFpEF) (HCC) Hypertensive urgency Elevated troponin Myocardial injury Elevated troponin likely related to demand ischemia from CHF and elevated BP  Patient denies having any chest pain and shortness of breath has improved.   Weaned from 4 >> 2 L/min O2 --Initially on IV Lasix BID dosing --Transitioned to Lasix drip today, currently at 20 mg/hr Net IO Since Admission: -809 mL [10/03/23 1620] --Continue home GDMT, with Nebivolol and hydralazine --Spironolactone and Avapro on hold due to worsening renal function --Wean off oxygen as tolerated  Hypertensive Urgency -- BP's severely elevated with systolic 200 Uncontrolled and may be rebound since patient was on clonidine but has not been resumed --Spironolactone and Avapro are on hold for AKI - resume when renal function permits --Cardiology added Imdur 30 BID and  spironolactone 25 daily --Clonidine increased to 0.2 mg --Hydralazine 100 mg TID --Bystolic was changed to Coreg 25 mg BID --Monitor Bps closely and titrate regimen  Uncontrolled type 2 diabetes mellitus with hyperglycemia, with long-term current with renal complications A1c 9.7% Patient on NPH and regular insulin at home --Continue Lantus 30 units daily with sliding scale coverage --Carb modified diet   AKI superimposed on CKD stage 3b  Creatinine 1.88 up from baseline of 1.4 Monitor for worsening renal function in view of IV diuretic therapy --Hold spironolactone and Avapro --Monitor renal function closely   Obesity, Class III, BMI 40-49.9 (morbid obesity) (HCC) Complicating factor to overall prognosis and care Lifestyle modification and exercise has been discussed with patient in detail      Subjective: Pt up in recliner when seen today, awake and more talkative today.  She denies complaints, asks when she can go home.  Requests nursing to come fix purewick that got out of place.   Physical Exam: Vitals:   10/03/23 0824 10/03/23 1000 10/03/23 1210 10/03/23 1609  BP: (!) 201/67 (!) 160/56 136/64 (!) 198/69  Pulse: 62  63 63  Resp:      Temp: 98.5 F (36.9 C)  98.3 F (36.8 C) 98.5 F (36.9 C)  TempSrc:      SpO2: 96%  96% 95%  Weight:      Height:       General exam: awake & alert, in recliner, no acute distress, obese HEENT: moist mucus membranes, hearing grossly normal  Respiratory system: CTAB diminished bases, no wheezes, normal respiratory effort. Cardiovascular system: normal S1/S2, RRR, no JVD,  murmurs, rubs, gallops, 3+ BLE pitting edema with some early skin wrinkling of distal lower legs.   Gastrointestinal system: soft, NT, ND, no HSM felt, +bowel sounds. Central nervous system: O x3. no gross focal neurologic deficits, normal speech Skin: dry, intact, normal temperature Psychiatry: normal mood, congruent affect, judgement and insight appear  normal     Data Reviewed: Notable labs --  BUN 25 Cr 1.73 >> 1.64 Ca 8.6 Hbg stable 10.4 >> 10.1  C diff negative  Family Communication: Plan of care was discussed with patient in detail.  No family at bedside, pt declined need for me to call anyone at this time.  Disposition: Status is: Inpatient Remains inpatient appropriate because: On IV diuretics for acute CHF    Planned Discharge Destination:  TBD     Time spent: 42 minutes  Author: Montey Apa, DO 10/03/2023 4:20 PM  For on call review [email or web address].

## 2023-10-03 NOTE — Consult Note (Signed)
 Advanced Heart Failure Team Consult Note   Primary Physician: [name], MD PCP-Cardiologist:  Julien Nordmann, MD  Reason for Consultation: Acute on chronic diastolic heart failure  HPI:    Alexis Farmer is a 83 y.o. female with a PMH of resistant hypertension, chronic HFpEF, obesity, type 2 diabetes, CKD stage IIIb who is seen today for evaluation of acute on chronic diastolic heart failure at the request of Dr Mariah Milling.   Patient presented with progressive shortness of breath and edema that is worsened over the past few months.  She reports having a persistent cough as well as orthopnea.  She has a longstanding history of hypertension and has been on multiple agents for some time now.  She was hospitalized in 03/2023 for generalized weakness and hyponatremia, found to be significantly volume overloaded.  She was also hospitalized twice 07/2023 for RSV as well as a fall at home.  She was seen by her PCP and continued to have orthopnea and lower extremity weakness.  She has had continued shortness of breath since that most recent hospitalization and reports having worsening lower extremity swelling as well.  She presented to the emergency department hypertensive with systolic blood pressures in the 160s, renal function was elevated from baseline to 1.8, and BNP was elevated to 784.  She was severely volume overloaded on exam.  She has been diuresed with varying degrees of success, recently started on an IV drip of Lasix at 8 mg/hour.  She has fairly dilute urine in her pure wick container but still feels significantly overloaded.  Echocardiogram at the time showed severe pulmonary hypertension      Objective:    Vital Signs:   Temp:  [98.3 F (36.8 C)-99.4 F (37.4 C)] 98.6 F (37 C) (04/17 1931) Pulse Rate:  [62-66] 62 (04/17 1931) Resp:  [18-20] 20 (04/17 1931) BP: (136-201)/(52-72) 182/68 (04/17 1931) SpO2:  [95 %-98 %] 98 % (04/17 1931) Weight:  [135.2 kg] 135.2 kg (04/17  0530) Last BM Date : 10/01/23  Weight change: Filed Weights   10/01/23 0515 10/02/23 0500 10/03/23 0530  Weight: 128.9 kg 132.5 kg 135.2 kg    Intake/Output:   Intake/Output Summary (Last 24 hours) at 10/03/2023 2129 Last data filed at 10/03/2023 1900 Gross per 24 hour  Intake 596 ml  Output 1100 ml  Net -504 ml      Physical Exam    General: Chronically ill-appearing, in respiratory distress Cor:  Regular rate & rhythm. No rubs, gallops or murmurs. JVP elevated past the earlobe.  3+ LE edema Lungs: Mildly increased work of breathing Abdomen: soft, nontender, nondistended Extremities: no cyanosis, clubbing, rash Neuro: alert & orientedx3, cranial nerves grossly intact. moves all 4 extremities w/o difficulty. Affect pleasant   Telemetry   Sinus rhythm in the 60s  Labs and medications reviewed in Epic.  Patient Profile   Alexis Farmer is a 83 y.o. female with a PMH of resistant hypertension, chronic HFpEF, obesity, type 2 diabetes, CKD stage IIIb who is seen today for evaluation of acute on chronic diastolic heart failure at the request of Dr Mariah Milling.   Assessment/Plan   Acute on chronic diastolic heart failure:  - With biventricular involvement and pulmonary hypertension as outlined below, LVEF 60 to 65% - Severely volume overloaded, per chart review she has gained 18 kg in the 3 to 4 weeks - Inadequate response to current regimen, would rebolus and increase Lasix to 20 mg/hour - Renal function is relatively preserved, will attempt  to start Entresto given her HFpEF phenotype and wean off clonidine - Reduce carvedilol to 6.25 mg twice daily given acute decompensation as well as no role in HFpEF - Continue hydralazine 100 mg 3 times daily, clonidine 0.2 mg twice daily for now - Stop isosorbide given headache on exam - Continue spironolactone 25 mg daily, increase as needed for potassium supplementation and BP  Pulmonary hypertension:  - Estimated PA systolic pressure  80 mmHg on most recent echo - Interestingly RV function is fairly well-preserved and the RV is not dilated - Suspect primarily who group 2 with severely uncontrolled hypertension - Potentially a group 3 component as well - Could consider right heart catheterization and further workup after extensive diuresis  CKD Stage IIIb: Responding well to diuresis, improving. - Start entresto as above  Diabetes:  - Management per IM, long standing history   Length of Stay: 3  Alexis Poll, MD  10/03/2023, 9:29 PM  Advanced Heart Failure Team Pager [phone] (M-F; 7a - 5p)  Please contact [facility] for night-coverage after hours (4p -7a ) and weekends on [email or web address]

## 2023-10-03 NOTE — Plan of Care (Signed)
  Problem: Education: Goal: Individualized Educational Video(s) Outcome: Progressing   Problem: Coping: Goal: Ability to adjust to condition or change in health will improve Outcome: Progressing   Problem: Fluid Volume: Goal: Ability to maintain a balanced intake and output will improve Outcome: Progressing   Problem: Health Behavior/Discharge Planning: Goal: Ability to identify and utilize available resources and services will improve Outcome: Progressing Goal: Ability to manage health-related needs will improve Outcome: Progressing   Problem: Metabolic: Goal: Ability to maintain appropriate glucose levels will improve Outcome: Progressing   Problem: Nutritional: Goal: Maintenance of adequate nutrition will improve Outcome: Progressing Goal: Progress toward achieving an optimal weight will improve Outcome: Progressing   Problem: Skin Integrity: Goal: Risk for impaired skin integrity will decrease Outcome: Progressing   Problem: Tissue Perfusion: Goal: Adequacy of tissue perfusion will improve Outcome: Progressing   Problem: Education: Goal: Knowledge of General Education information will improve Description: Including pain rating scale, medication(s)/side effects and non-pharmacologic comfort measures Outcome: Progressing   Problem: Health Behavior/Discharge Planning: Goal: Ability to manage health-related needs will improve Outcome: Progressing   Problem: Clinical Measurements: Goal: Ability to maintain clinical measurements within normal limits will improve Outcome: Progressing Goal: Will remain free from infection Outcome: Progressing Goal: Diagnostic test results will improve Outcome: Progressing Goal: Respiratory complications will improve Outcome: Progressing Goal: Cardiovascular complication will be avoided Outcome: Progressing   Problem: Activity: Goal: Risk for activity intolerance will decrease Outcome: Progressing   Problem: Nutrition: Goal:  Adequate nutrition will be maintained Outcome: Progressing   Problem: Coping: Goal: Level of anxiety will decrease Outcome: Progressing   Problem: Elimination: Goal: Will not experience complications related to bowel motility Outcome: Progressing Goal: Will not experience complications related to urinary retention Outcome: Progressing   Problem: Pain Managment: Goal: General experience of comfort will improve and/or be controlled Outcome: Progressing   Problem: Safety: Goal: Ability to remain free from injury will improve Outcome: Progressing   Problem: Skin Integrity: Goal: Risk for impaired skin integrity will decrease Outcome: Progressing   Problem: Education: Goal: Ability to demonstrate management of disease process will improve Outcome: Progressing Goal: Ability to verbalize understanding of medication therapies will improve Outcome: Progressing Goal: Individualized Educational Video(s) Outcome: Progressing   Problem: Activity: Goal: Capacity to carry out activities will improve Outcome: Progressing   Problem: Cardiac: Goal: Ability to achieve and maintain adequate cardiopulmonary perfusion will improve Outcome: Progressing

## 2023-10-03 NOTE — Progress Notes (Signed)
 Heart Failure Stewardship Pharmacy Note  PCP: Valli Gaw, MD PCP-Cardiologist: Belva Boyden, MD  HPI: Alexis Farmer is a 83 y.o. female with monoclonal B-cell lymphoma, stage IIIb CKD, DM, HTN, HFpEF, chronic pain, obesity, immobility, PAD, hospitalized in February with a UTI, and in November with RSV who presented with a 2 week hiistory of worsening shortness of breath, orthopnea, and worsening LEE. On admission, BNP was 784, HS-troponin was 31. Chest x-ray noted to be consistent with CHF.   Pertinent cardiac history: Echo 07/2014 with LVEF 60-65% and moderate LVH. Echo 06/2016 with LVEF 62% with grade I diastolic dysfunction. Echo 11/2020 with LVEF 60-65%. Echo 03/2023 with LVEF 60-65%, moderate LVH, grade I diastolic dysfunction. Echo 09/2023 with LVEF 60-65%, moderate LVH, and grade I diastolic dysfunction.   Pertinent Lab Values: Creatinine  Date Value Ref Range Status  07/15/2023 1.42 (H) 0.44 - 1.00 mg/dL Final   Creat  Date Value Ref Range Status  02/06/2019 1.52 (H) 0.60 - 0.88 mg/dL Final    Comment:    For patients >39 years of age, the reference limit for Creatinine is approximately 13% higher for people identified as African-American. .    Creatinine, Ser  Date Value Ref Range Status  10/03/2023 1.64 (H) 0.44 - 1.00 mg/dL Final   BUN  Date Value Ref Range Status  10/03/2023 25 (H) 8 - 23 mg/dL Final  45/40/9811 11 4 - 21 Final   Potassium  Date Value Ref Range Status  10/03/2023 3.7 3.5 - 5.1 mmol/L Final   Sodium  Date Value Ref Range Status  10/03/2023 136 135 - 145 mmol/L Final  02/19/2019 138 137 - 147 Final   B Natriuretic Peptide  Date Value Ref Range Status  09/29/2023 784.0 (H) 0.0 - 100.0 pg/mL Final    Comment:    Performed at [facility], [address], [place], [place] [address]   Magnesium  Date Value Ref Range Status  10/03/2023 1.9 1.7 - 2.4 mg/dL Final    Comment:    Performed at [facility], [address] [place], [place] [address]   Hemoglobin A1C  Date Value Ref Range Status  08/01/2023 9.7  Final   TSH  Date Value Ref Range Status  11/27/2022 2.204 0.350 - 4.500 uIU/mL Final    Comment:    Performed by a 3rd Generation assay with a functional sensitivity of <=0.01 uIU/mL. Performed at [facility], [address], [place], [place] [address]   12/13/2021 2.21 0.35 - 5.50 uIU/mL Final   LDH  Date Value Ref Range Status  10/10/2022 171 98 - 192 U/L Final    Comment:    Performed at [facility], [address] [place], [place] [address]    Vital Signs: Admission weight: Temp:  [98.1 F (36.7 C)-99.4 F (37.4 C)] 98.5 F (36.9 C) (04/17 0824) Pulse Rate:  [62-67] 62 (04/17 0824) Cardiac Rhythm: Heart block (04/17 0716) Resp:  [18-20] 20 (04/17 0543) BP: (140-201)/(53-101) 201/67 (04/17 0824) SpO2:  [96 %-100 %] 96 % (04/17 0824) Weight:  [135.2 kg (298 lb 1 oz)] 135.2 kg (298 lb 1 oz) (04/17 0530)  Intake/Output Summary (Last 24 hours) at 10/03/2023 1040 Last data filed at 10/03/2023 1000 Gross per 24 hour  Intake 116 ml  Output 500 ml  Net -384 ml   Assessment/Plan: -Advanced CHF team consulted. Will sign off and follow along.  Please do not hesitate to reach out with questions or concerns,  Brooklynne Pereida  Agustina Aldrich, PharmD, CPP, BCPS Heart Failure Pharmacist  Phone - [phone] 10/03/2023 3:30 PM

## 2023-10-03 NOTE — Telephone Encounter (Signed)
 Patient Product/process development scientist completed.    The patient is insured through [facility]. Patient has [employer], may use a copay card, and/or apply for patient assistance if available.    Ran test claim for Entresto 24-26 mg and the current 30 day co-pay is $10.00.  Ran test claim for Jardiance 10 mg and the current 30 day co-pay is $10.00.  Ran test claim for Farxiga 10 mg and the current 30 day co-pay is $510.00.    This test claim was processed through [facility]- copay amounts may vary at other pharmacies due to pharmacy/plan contracts, or as the patient moves through the different stages of their insurance plan.     Morgan Arab, CPHT Pharmacy Technician III Certified Patient Advocate [facility] Patient Advocate Team Direct Number: [phone]  Fax: [phone]

## 2023-10-04 DIAGNOSIS — I5033 Acute on chronic diastolic (congestive) heart failure: Secondary | ICD-10-CM | POA: Diagnosis not present

## 2023-10-04 LAB — BASIC METABOLIC PANEL WITH GFR
Anion gap: 8 (ref 5–15)
Anion gap: 11 (ref 5–15)
BUN: 24 mg/dL — ABNORMAL HIGH (ref 8–23)
BUN: 23 mg/dL (ref 8–23)
CO2: 28 mmol/L (ref 22–32)
CO2: 31 mmol/L (ref 22–32)
Calcium: 8.7 mg/dL — ABNORMAL LOW (ref 8.9–10.3)
Calcium: 9.1 mg/dL (ref 8.9–10.3)
Chloride: 98 mmol/L (ref 98–111)
Chloride: 95 mmol/L — ABNORMAL LOW (ref 98–111)
Creatinine, Ser: 1.67 mg/dL — ABNORMAL HIGH (ref 0.44–1.00)
Creatinine, Ser: 1.58 mg/dL — ABNORMAL HIGH (ref 0.44–1.00)
GFR, Estimated: 30 mL/min — ABNORMAL LOW (ref >60)
GFR, Estimated: 32 mL/min — ABNORMAL LOW (ref >60)
Glucose, Bld: 214 mg/dL — ABNORMAL HIGH (ref 70–99)
Glucose, Bld: 197 mg/dL — ABNORMAL HIGH (ref 70–99)
Potassium: 3.7 mmol/L (ref 3.5–5.1)
Potassium: 3.6 mmol/L (ref 3.5–5.1)
Sodium: 134 mmol/L — ABNORMAL LOW (ref 135–145)
Sodium: 137 mmol/L (ref 135–145)

## 2023-10-04 LAB — GLUCOSE, CAPILLARY
Glucose-Capillary: 289 mg/dL — ABNORMAL HIGH (ref 70–99)
Glucose-Capillary: 262 mg/dL — ABNORMAL HIGH (ref 70–99)
Glucose-Capillary: 156 mg/dL — ABNORMAL HIGH (ref 70–99)
Glucose-Capillary: 121 mg/dL — ABNORMAL HIGH (ref 70–99)

## 2023-10-04 MED ORDER — SACUBITRIL-VALSARTAN 49-51 MG PO TABS
1.0000 | ORAL_TABLET | Freq: Two times a day (BID) | ORAL | Status: DC
  Administered 2023-10-04: 1 via ORAL
  Filled 2023-10-04: qty 1

## 2023-10-04 MED ORDER — POTASSIUM CHLORIDE CRYS ER 20 MEQ PO TBCR
40.0000 meq | EXTENDED_RELEASE_TABLET | Freq: Once | ORAL | Status: AC
  Administered 2023-10-04: 40 meq via ORAL
  Filled 2023-10-04: qty 2

## 2023-10-04 MED ORDER — MAGNESIUM SULFATE 2 GM/50ML IV SOLN
2.0000 g | Freq: Once | INTRAVENOUS | Status: AC
  Administered 2023-10-04: 2 g via INTRAVENOUS
  Filled 2023-10-04 (×2): qty 50

## 2023-10-04 MED ORDER — INSULIN ASPART 100 UNIT/ML IJ SOLN
3.0000 [IU] | Freq: Three times a day (TID) | INTRAMUSCULAR | Status: DC
  Administered 2023-10-05 – 2023-10-15 (×18): 3 [IU] via SUBCUTANEOUS
  Filled 2023-10-04 (×18): qty 1

## 2023-10-04 MED ORDER — SACUBITRIL-VALSARTAN 97-103 MG PO TABS
1.0000 | ORAL_TABLET | Freq: Two times a day (BID) | ORAL | Status: DC
  Administered 2023-10-04 – 2023-10-06 (×4): 1 via ORAL
  Filled 2023-10-04 (×4): qty 1

## 2023-10-04 MED ORDER — INSULIN GLARGINE-YFGN 100 UNIT/ML ~~LOC~~ SOLN
15.0000 [IU] | Freq: Every day | SUBCUTANEOUS | Status: DC
  Administered 2023-10-05 – 2023-10-07 (×2): 15 [IU] via SUBCUTANEOUS
  Filled 2023-10-04 (×3): qty 0.15

## 2023-10-04 NOTE — Plan of Care (Signed)
  Problem: Education: Goal: Individualized Educational Video(s) Outcome: Progressing   Problem: Coping: Goal: Ability to adjust to condition or change in health will improve Outcome: Progressing   Problem: Fluid Volume: Goal: Ability to maintain a balanced intake and output will improve Outcome: Progressing   Problem: Health Behavior/Discharge Planning: Goal: Ability to identify and utilize available resources and services will improve Outcome: Progressing Goal: Ability to manage health-related needs will improve Outcome: Progressing   Problem: Metabolic: Goal: Ability to maintain appropriate glucose levels will improve Outcome: Progressing   Problem: Nutritional: Goal: Maintenance of adequate nutrition will improve Outcome: Progressing Goal: Progress toward achieving an optimal weight will improve Outcome: Progressing   Problem: Skin Integrity: Goal: Risk for impaired skin integrity will decrease Outcome: Progressing   Problem: Tissue Perfusion: Goal: Adequacy of tissue perfusion will improve Outcome: Progressing   Problem: Education: Goal: Knowledge of General Education information will improve Description: Including pain rating scale, medication(s)/side effects and non-pharmacologic comfort measures Outcome: Progressing   Problem: Health Behavior/Discharge Planning: Goal: Ability to manage health-related needs will improve Outcome: Progressing   Problem: Clinical Measurements: Goal: Ability to maintain clinical measurements within normal limits will improve Outcome: Progressing Goal: Will remain free from infection Outcome: Progressing Goal: Diagnostic test results will improve Outcome: Progressing Goal: Respiratory complications will improve Outcome: Progressing Goal: Cardiovascular complication will be avoided Outcome: Progressing   Problem: Activity: Goal: Risk for activity intolerance will decrease Outcome: Progressing   Problem: Nutrition: Goal:  Adequate nutrition will be maintained Outcome: Progressing   Problem: Coping: Goal: Level of anxiety will decrease Outcome: Progressing   Problem: Elimination: Goal: Will not experience complications related to bowel motility Outcome: Progressing Goal: Will not experience complications related to urinary retention Outcome: Progressing   Problem: Pain Managment: Goal: General experience of comfort will improve and/or be controlled Outcome: Progressing   Problem: Safety: Goal: Ability to remain free from injury will improve Outcome: Progressing   Problem: Skin Integrity: Goal: Risk for impaired skin integrity will decrease Outcome: Progressing   Problem: Education: Goal: Ability to demonstrate management of disease process will improve Outcome: Progressing Goal: Ability to verbalize understanding of medication therapies will improve Outcome: Progressing Goal: Individualized Educational Video(s) Outcome: Progressing   Problem: Activity: Goal: Capacity to carry out activities will improve Outcome: Progressing   Problem: Cardiac: Goal: Ability to achieve and maintain adequate cardiopulmonary perfusion will improve Outcome: Progressing

## 2023-10-04 NOTE — Progress Notes (Addendum)
 Advanced Heart Failure Rounding Note  PCP-Cardiologist: Belva Boyden, MD   Subjective:     Appears clinically much improved, breathing is more comfortably and despite poor urine output recorded notes that she has had to have her bed changed multiple times due to leaking from her purewick site. Discussed that we could consider transition to foley catheter if need be.    Objective:   Weight Range: 127.8 kg Body mass index is 49.91 kg/m.   Vital Signs:   Temp:  [98.5 F (36.9 C)-98.6 F (37 C)] 98.6 F (37 C) (04/18 0854) Pulse Rate:  [58-68] 68 (04/18 0854) Resp:  [20] 20 (04/18 0403) BP: (147-198)/(47-69) 195/69 (04/18 0854) SpO2:  [92 %-98 %] 97 % (04/18 0854) Weight:  [127.8 kg] 127.8 kg (04/18 0500) Last BM Date : 10/03/23  Weight change: Filed Weights   10/02/23 0500 10/03/23 0530 10/04/23 0500  Weight: 132.5 kg 135.2 kg 127.8 kg    Intake/Output:   Intake/Output Summary (Last 24 hours) at 10/04/2023 1427 Last data filed at 10/04/2023 0404 Gross per 24 hour  Intake 0 ml  Output 1000 ml  Net -1000 ml      Physical Exam    General:  Chronically ill appearing Cor:  Regular rate & rhythm. Systolic murmur, improving 2+ LE edmea Lungs: Improved WOB Abdomen: soft, nontender, nondistended Extremities: no cyanosis, clubbing, rash Neuro: alert & orientedx3, cranial nerves grossly intact. moves all 4 extremities w/o difficulty. Affect pleasant    Telemetry   Sinus in the 60s  Patient Profile   Alexis Farmer is a 83 y.o. female with a PMH of resistant hypertension, chronic HFpEF, obesity, type 2 diabetes, CKD stage IIIb who is seen today for evaluation of acute on chronic diastolic heart failure at the request of Dr Gollan.   Assessment/Plan   Acute on chronic diastolic heart failure:  - With biventricular involvement and pulmonary hypertension as outlined below, LVEF 60 to 65% - Severely volume overloaded, per chart review she has gained 18 kg in  the 3 to 4 weeks - Improving on high dose lasix  gtt, would repeat afternoon BMP - Bed weights likely inaccurate, would attempt to get standing weight - Consider foley catheter placement given leakage with purewick while on high dose gtt - Increase entresto  to goal dose, personally checked BP and systolic still >180 - Reduce carvedilol  to 6.25 mg twice daily - Continue hydralazine  100 mg 3 times daily, clonidine  0.2 mg twice daily for now, can hopefully wean clonidine  over the weeked - Headache with isosorbide  - Continue spironolactone  25 mg daily, increase as needed for potassium supplementation and BP - Poor SGLT-2 candidate   Pulmonary hypertension:  - Estimated PA systolic pressure 80 mmHg on most recent echo - Interestingly RV function is fairly well-preserved and the RV is not dilated - Suspect primarily who group 2 with severely uncontrolled hypertension - Potentially a group 3 component as well - Given age, functional status, and known group II disease unlikely to gain much benefit from RHC - Can consider if difficulty diuresing   CKD Stage IIIb: Responding well to diuresis, improving. - Increase entresto  as above   Diabetes:  - Management per IM, long standing history  [facility] will round on patient over the  weekend. Reach out with any concerns  Length of Stay: 4  Lauralee Poll, MD  10/04/2023, 2:27 PM  Advanced Heart Failure Team Pager [phone] (M-F; 7a - 5p)  Please contact [facility] for night-coverage after hours (  5p -7a ) and weekends on [email or web address]

## 2023-10-04 NOTE — NC FL2 (Signed)
 [place]  MEDICAID FL2 LEVEL OF CARE FORM     IDENTIFICATION  Patient Name: Alexis Farmer Birthdate: 12-30-1940 Sex: female Admission Date (Current Location): 09/29/2023  [facility] and [place] Number:  Chiropodist and Address:  [facility], [address] [place], [place] [address]      Provider Number: [id number]  Attending Physician Name and Address:  Montey Apa, DO  Relative Name and Phone Number:  Kai Orange (Granddaughter)  [phone] [facility])    Current Level of Care: Hospital Recommended Level of Care: Skilled Nursing Facility Prior Approval Number:    Date Approved/Denied:   PASRR Number: [id number]  Discharge Plan: Home    Current Diagnoses: Patient Active Problem List   Diagnosis Date Noted   Shortness of breath 10/02/2023   Morbid obesity (HCC) 10/02/2023   Severe pulmonary hypertension (HCC) 10/01/2023   Congestive heart failure (HCC) 09/29/2023   RSV infection 08/08/2023   Obesity (BMI 30-39.9) 08/04/2023   Diffuse pain 08/03/2023   Abdominal pain 08/03/2023   Abdominal pain, LLQ 08/03/2023   Cellulitis of left lower extremity 04/02/2023   Hyponatremia 04/01/2023   Acute on chronic heart failure with preserved ejection fraction (HFpEF) (HCC) 04/01/2023   Cellulitis 04/01/2023   Lower extremity weakness 04/01/2023   Inability to access health care due to transportation insecurity 03/31/2023   Mood disorder (HCC) 01/20/2023   Vision loss, bilateral 10/22/2022   Pre-syncope 10/22/2022   Ceruminosis, right 09/07/2022   Type 2 diabetes mellitus without complications (HCC) 08/14/2022   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/2022   Hallucinations, visual 07/20/2022   Need for pneumococcal vaccination 07/20/2022   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   Chronic renal failure (CRF), stage 3b (HCC) 03/21/2022   Ileus (HCC) 08/22/2021   Lymphedema 04/19/2021    Atherosclerosis of aorta (HCC) 01/16/2021   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/16/2021   Spinal stenosis of lumbar region 01/16/2021   Panic attack 01/15/2021   Hypertensive urgency 12/15/2020   Other chronic pain 08/25/2020   Hypertension associated with diabetes (HCC) 08/25/2020   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) 05/30/2020   Retinopathy 05/30/2020   Multinodular goiter 02/04/2020   Proteinuria 12/22/2019   Physical deconditioning 08/13/2019   Abdominal aortic atherosclerosis (HCC) 07/24/2019   Obesity, Class III, BMI 40-49.9 (morbid obesity) (HCC) 06/09/2019   Hypercalcemia 03/18/2019   Acute renal failure superimposed on stage 3b chronic kidney disease (HCC) 02/06/2019   Round hole, unspecified eye 02/06/2019   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 08/13/2018   Abnormal gait 03/26/2018   Arthritis 03/26/2018   Thyroid  nodule 01/20/2018   Cerumen impaction 01/21/2017   UTI (urinary tract infection) 09/24/2016   Rectal hemorrhage 07/29/2016   Carotid artery stenosis 07/19/2016   PAD (peripheral artery disease) (HCC) 07/19/2016   Chronic diastolic CHF (congestive heart failure) (HCC) 05/31/2016   Chronic fatigue 11/15/2015   Depression, recurrent (HCC) 10/13/2015   Osteoarthritis 10/13/2015   Mixed hyperlipidemia 08/26/2014   Uncontrolled type 2 diabetes mellitus with hyperglycemia, with long-term current use of insulin  (HCC) 04/08/2014   Diabetic polyneuropathy (HCC) 04/08/2014   Type 2 diabetes mellitus with other diabetic kidney complication (HCC) 04/08/2014    Orientation RESPIRATION BLADDER Height & Weight     Self, Time, Situation, Place  Normal External catheter, Incontinent Weight: 127.8 kg Height:  5\' 3"  (160 cm)  BEHAVIORAL SYMPTOMS/MOOD  NEUROLOGICAL BOWEL NUTRITION STATUS  Other (Comment)  (n/a)  (n/a) Continent Diet (Carb Modified)  AMBULATORY STATUS COMMUNICATION OF NEEDS Skin   Limited Assist Verbally Normal                       Personal Care Assistance Level of Assistance  Bathing, Dressing Bathing Assistance: Limited assistance   Dressing Assistance: Limited assistance     Functional Limitations Info  Sight, Hearing Sight Info: Impaired Hearing Info: Impaired      SPECIAL CARE FACTORS FREQUENCY  PT (By licensed PT), OT (By licensed OT)     PT Frequency: Min 2x weekly OT Frequency: Min 2x weekly            Contractures Contractures Info: Not present    Additional Factors Info  Code Status, Allergies Code Status Info: FULL Allergies Info: Celexa  (Citalopram ), Jardiance  (Empagliflozin ), Norvasc  (Amlodipine ), Tape, Penicillin V, Penicillin V Potassium, Penicillins           Current Medications (10/04/2023):  This is the current hospital active medication list Current Facility-Administered Medications  Medication Dose Route Frequency Provider Last Rate Last Admin   acetaminophen  (TYLENOL ) tablet 650 mg  650 mg Oral Q6H PRN Duncan, Hazel V, MD   650 mg at 10/04/23 1610   Or   acetaminophen  (TYLENOL ) suppository 650 mg  650 mg Rectal Q6H PRN Duncan, Hazel V, MD       aspirin  EC tablet 81 mg  81 mg Oral Once per day on Monday Thursday Lanetta Pion, MD   81 mg at 10/03/23 9604   benzonatate  (TESSALON ) capsule 200 mg  200 mg Oral TID PRN Duncan, Hazel V, MD   200 mg at 09/30/23 2208   carvedilol  (COREG ) tablet 6.25 mg  6.25 mg Oral BID WC Stoner, Tabak J, MD   6.25 mg at 10/04/23 5409   cloNIDine  (CATAPRES ) tablet 0.2 mg  0.2 mg Oral BID Gollan, Timothy J, MD   0.2 mg at 10/04/23 8119   enoxaparin  (LOVENOX ) injection 65 mg  0.5 mg/kg Subcutaneous Q24H Duncan, Hazel V, MD   65 mg at 10/04/23 1478   furosemide  (LASIX ) 200 mg in dextrose  5 % 100 mL (2 mg/mL) infusion  20 mg/hr Intravenous Continuous Lauralee Poll, MD 10 mL/hr at 10/04/23 0254  20 mg/hr at 10/04/23 0254   hydrALAZINE  (APRESOLINE ) tablet 100 mg  100 mg Oral Q8H End, Christopher, MD   100 mg at 10/04/23 0533   HYDROcodone -acetaminophen  (NORCO/VICODIN) 5-325 MG per tablet 1-2 tablet  1-2 tablet Oral Q4H PRN Duncan, Hazel V, MD   2 tablet at 10/03/23 1613   insulin  aspart (novoLOG ) injection 0-15 Units  0-15 Units Subcutaneous TID WC Agbata, Tochukwu, MD   8 Units at 10/04/23 0850   insulin  glargine-yfgn (SEMGLEE ) injection 10 Units  10 Units Subcutaneous Daily Darus Engels A, DO   10 Units at 10/04/23 0850   ipratropium-albuterol  (DUONEB) 0.5-2.5 (3) MG/3ML nebulizer solution 3 mL  3 mL Nebulization Q6H PRN Agbata, Tochukwu, MD   3 mL at 10/01/23 1953   LORazepam  (ATIVAN ) tablet 0.5 mg  0.5 mg Oral Daily PRN Duncan, Hazel V, MD   0.5 mg at 10/03/23 2059   magnesium  sulfate IVPB 2 g 50 mL  2 g Intravenous Once Stoner, Bennion J, MD       ondansetron  (ZOFRAN ) tablet 4 mg  4 mg Oral Q6H PRN Duncan, Hazel V, MD   4 mg at 10/04/23 2956   Or  ondansetron  (ZOFRAN ) injection 4 mg  4 mg Intravenous Q6H PRN Duncan, Hazel V, MD       potassium chloride  SA (KLOR-CON  M) CR tablet 40 mEq  40 mEq Oral Once Lauralee Poll, MD       rosuvastatin  (CRESTOR ) tablet 20 mg  20 mg Oral Daily Duncan, Hazel V, MD   20 mg at 10/04/23 1610   sacubitril -valsartan  (ENTRESTO ) 49-51 mg per tablet  1 tablet Oral BID Stoner, Matson J, MD   1 tablet at 10/04/23 9604   spironolactone  (ALDACTONE ) tablet 25 mg  25 mg Oral Daily Gollan, Timothy J, MD   25 mg at 10/04/23 5409   traZODone  (DESYREL ) tablet 50 mg  50 mg Oral QHS PRN Duncan, Hazel V, MD   50 mg at 10/03/23 2059     Discharge Medications: Please see discharge summary for a list of discharge medications.  Relevant Imaging Results:  Relevant Lab Results:   Additional Information SSN: [id number]  Baird Bombard, RN

## 2023-10-04 NOTE — Progress Notes (Addendum)
 Progress Note   Patient: Franny Selvage ZOX:[id number] DOB: 04/02/1941 DOA: 09/29/2023     4 DOS: the patient was seen and examined on 10/04/2023   Brief hospital course:  Callaway Annas is a 83 y.o. female with medical history significant for monoclonal B-cell lymphoma, stage IIIb CKD, DM, HTN, HFpEF, chronic pain, obesity, immobility , hospitalized in February with a UTI, and in November with RSV who presents by EMS with a 2-week history of shortness of breath.  She states since her RSV infection her breathing never got quite right however over the past few days she has had worsening shortness of breath, with orthopnea and lower extremity edema. ED course and data review: SBP in the 200s HB at 11.6 and platelets 670. Creatinine slightly above baseline at 1.83 Glucose 221 EKG, personally viewed and interpreted showing sinus at 71 with LVH and no acute ST-T wave changes. Chest x-ray with findings suggestive of CHF Patient given IV Lasix  60 mg Hospitalist consulted for admission.      Assessment and Plan:  * Acute on chronic heart failure with preserved ejection fraction (HFpEF) (HCC) Hypertensive urgency Elevated troponin Myocardial injury Elevated troponin likely related to demand ischemia from CHF and elevated BP  Patient denies having any chest pain and shortness of breath has improved.   Weaned from 4 >> 2 L/min O2 >> room air --Cardiology, AHF team following, appreciate management --Initially on IV Lasix  BID dosing --Transitioned to Lasix  drip 4/17, currently at 20 mg/hr Net IO Since Admission: -1,809 mL [10/04/23 1507] (not accurate) --May need to place Foley for accurate Io's  --Continue hydralazine  100 mg 3 times daily, clonidine  0.2 mg twice daily for now, can hopefully wean clonidine  over the weeked  --Coreg  reduced to 6.25 mg BID  --Imdur  stopped due to headaches --Spironolactone  resumed  --Started on Entresto  --Wean off oxygen as tolerated  Hypertensive Urgency  -- BP's severely elevated with systolic 200 Uncontrolled and may be rebound since patient was on clonidine  but has not been resumed --Spironolactone  and Avapro  are on hold for AKI - resume when renal function permits --Cardiology added Imdur  30 BID and spironolactone  25 daily --Clonidine  increased to 0.2 mg --Hydralazine  100 mg TID --Bystolic  was changed to Coreg  25 mg BID --Monitor Bps closely and titrate regimen  Uncontrolled type 2 diabetes mellitus with hyperglycemia, with long-term current with renal complications A1c 9.7% Patient on NPH and regular insulin  at home --Reduced home basal dose 30 units >> 10 daily  >> increase to 15 units daily --Sliding scale Novolog , moderate --Add 3 units Novolog  TID WC --Carb modified diet   AKI superimposed on CKD stage 3b  Creatinine 1.88 up from baseline of 1.4 Monitor for worsening renal function in view of IV diuretic therapy --Hold spironolactone  and Avapro  --Monitor renal function closely   Obesity, Class III, BMI 40-49.9 (morbid obesity) (HCC) Complicating factor to overall prognosis and care Lifestyle modification and exercise has been discussed with patient in detail      Subjective: Pt awake resting in bed this AM, reports purewick was removed.   Physical Exam: Vitals:   10/03/23 2328 10/04/23 0403 10/04/23 0500 10/04/23 0854  BP: (!) 147/47 (!) 186/58  (!) 195/69  Pulse: (!) 58 62  68  Resp: 20 20    Temp: 98.5 F (36.9 C) 98.6 F (37 C)  98.6 F (37 C)  TempSrc:      SpO2: 92% 97%  97%  Weight:   127.8 kg   Height:  General exam: awake & alert, in recliner, no acute distress, obese HEENT: moist mucus membranes, hearing grossly normal  Respiratory system: CTAB diminished bases, no wheezes, normal respiratory effort. Cardiovascular system: normal S1/S2, RRR, persistent but severe 2+ BLE pitting edema with early skin wrinkling of distal lower legs.   Gastrointestinal system: soft, NT, ND, no HSM felt, +bowel  sounds. Central nervous system: A&O x3. no gross focal neurologic deficits, normal speech Skin: dry, intact, normal temperature Psychiatry: normal mood, congruent affect, judgement and insight appear normal     Data Reviewed: Notable labs --  Na 134 Glucose 214 BUN 24 Cr 1.73 >> 1.64 >> 1.67 Ca 8.7  Last Hbg stable 10.4 >> 10.1  C diff negative    Family Communication: Plan of care was discussed with patient in detail.  No family at bedside, pt declined need for me to call anyone at this time.  Disposition: Status is: Inpatient Remains inpatient appropriate because: Remains on Lasix  drip     Planned Discharge Destination: SNF recommended      Time spent: 40 minutes  Author: Montey Apa, DO 10/04/2023 3:07 PM  For on call review [email or web address].

## 2023-10-05 DIAGNOSIS — Z794 Long term (current) use of insulin: Secondary | ICD-10-CM | POA: Diagnosis not present

## 2023-10-05 DIAGNOSIS — E1121 Type 2 diabetes mellitus with diabetic nephropathy: Secondary | ICD-10-CM | POA: Diagnosis not present

## 2023-10-05 DIAGNOSIS — I5033 Acute on chronic diastolic (congestive) heart failure: Secondary | ICD-10-CM | POA: Diagnosis not present

## 2023-10-05 DIAGNOSIS — I10 Essential (primary) hypertension: Secondary | ICD-10-CM | POA: Diagnosis not present

## 2023-10-05 LAB — BASIC METABOLIC PANEL WITH GFR
Anion gap: 9 (ref 5–15)
BUN: 25 mg/dL — ABNORMAL HIGH (ref 8–23)
CO2: 31 mmol/L (ref 22–32)
Calcium: 8.7 mg/dL — ABNORMAL LOW (ref 8.9–10.3)
Chloride: 96 mmol/L — ABNORMAL LOW (ref 98–111)
Creatinine, Ser: 1.74 mg/dL — ABNORMAL HIGH (ref 0.44–1.00)
GFR, Estimated: 29 mL/min — ABNORMAL LOW (ref >60)
Glucose, Bld: 159 mg/dL — ABNORMAL HIGH (ref 70–99)
Potassium: 3.7 mmol/L (ref 3.5–5.1)
Sodium: 136 mmol/L (ref 135–145)

## 2023-10-05 LAB — GLUCOSE, CAPILLARY
Glucose-Capillary: 241 mg/dL — ABNORMAL HIGH (ref 70–99)
Glucose-Capillary: 164 mg/dL — ABNORMAL HIGH (ref 70–99)
Glucose-Capillary: 122 mg/dL — ABNORMAL HIGH (ref 70–99)
Glucose-Capillary: 143 mg/dL — ABNORMAL HIGH (ref 70–99)

## 2023-10-05 MED ORDER — FLEET ENEMA RE ENEM
1.0000 | ENEMA | Freq: Every day | RECTAL | Status: DC | PRN
  Administered 2023-10-05: 1 via RECTAL

## 2023-10-05 MED ORDER — SENNOSIDES-DOCUSATE SODIUM 8.6-50 MG PO TABS
1.0000 | ORAL_TABLET | Freq: Two times a day (BID) | ORAL | Status: DC
  Administered 2023-10-05 – 2023-10-15 (×15): 1 via ORAL
  Filled 2023-10-05 (×19): qty 1

## 2023-10-05 MED ORDER — BISACODYL 10 MG RE SUPP
10.0000 mg | Freq: Every day | RECTAL | Status: DC | PRN
Start: 2023-10-05 — End: 2023-10-15
  Administered 2023-10-05 – 2023-10-11 (×2): 10 mg via RECTAL
  Filled 2023-10-05 (×2): qty 1

## 2023-10-05 MED ORDER — SPIRONOLACTONE 25 MG PO TABS
50.0000 mg | ORAL_TABLET | Freq: Every day | ORAL | Status: DC
Start: 2023-10-06 — End: 2023-10-06
  Administered 2023-10-06: 50 mg via ORAL
  Filled 2023-10-05: qty 2

## 2023-10-05 MED ORDER — POLYETHYLENE GLYCOL 3350 17 G PO PACK
17.0000 g | PACK | Freq: Every day | ORAL | Status: DC
  Administered 2023-10-05 – 2023-10-15 (×6): 17 g via ORAL
  Filled 2023-10-05 (×8): qty 1

## 2023-10-05 MED ORDER — SPIRONOLACTONE 25 MG PO TABS
25.0000 mg | ORAL_TABLET | Freq: Once | ORAL | Status: AC
  Administered 2023-10-05: 25 mg via ORAL
  Filled 2023-10-05: qty 1

## 2023-10-05 MED ORDER — HYDROCORTISONE (PERIANAL) 2.5 % EX CREA
1.0000 | TOPICAL_CREAM | Freq: Four times a day (QID) | CUTANEOUS | Status: DC | PRN
  Administered 2023-10-05 – 2023-10-12 (×3): 1 via TOPICAL
  Filled 2023-10-05 (×2): qty 28.35

## 2023-10-05 NOTE — Progress Notes (Addendum)
 Cardiology Progress Note   Patient Name: Alexis Farmer Date of Encounter: 10/05/2023  Primary Cardiologist: Timothy Gollan, MD  Subjective   Notes good urine output on Lasix  infusion.  Feels that tightness in legs and abdomen is improving but not at baseline.  No chest pain or dyspnea at rest. Objective   Inpatient Medications    Scheduled Meds:  aspirin  EC  81 mg Oral Once per day on Monday Thursday   carvedilol   6.25 mg Oral BID WC   cloNIDine   0.2 mg Oral BID   enoxaparin  (LOVENOX ) injection  0.5 mg/kg Subcutaneous Q24H   hydrALAZINE   100 mg Oral Q8H   insulin  aspart  0-15 Units Subcutaneous TID WC   insulin  aspart  3 Units Subcutaneous TID WC   insulin  glargine-yfgn  15 Units Subcutaneous Daily   rosuvastatin   20 mg Oral Daily   sacubitril -valsartan   1 tablet Oral BID   spironolactone   25 mg Oral Daily   Continuous Infusions:  furosemide  (LASIX ) 200 mg in dextrose  5 % 100 mL (2 mg/mL) infusion 20 mg/hr (10/05/23 0640)   PRN Meds: acetaminophen  **OR** acetaminophen , benzonatate , HYDROcodone -acetaminophen , ipratropium-albuterol , LORazepam , ondansetron  **OR** ondansetron  (ZOFRAN ) IV, traZODone    Vital Signs    Vitals:   10/05/23 0041 10/05/23 0406 10/05/23 0500 10/05/23 0833  BP: (!) 142/50 (!) 160/57  (!) 179/48  Pulse: 63 61  66  Resp: 20 18  16   Temp: 98.4 F (36.9 C) 97.9 F (36.6 C)  99 F (37.2 C)  TempSrc:    Oral  SpO2: 100% 97%  99%  Weight:   126.9 kg   Height:        Intake/Output Summary (Last 24 hours) at 10/05/2023 1217 Last data filed at 10/05/2023 1021 Gross per 24 hour  Intake 660 ml  Output 2000 ml  Net -1340 ml   Filed Weights   10/03/23 0530 10/04/23 0500 10/05/23 0500  Weight: 135.2 kg 127.8 kg 126.9 kg    Physical Exam   GEN: Well nourished, well developed, in no acute distress.  HEENT: Grossly normal.  Neck: Supple, moderately elevated JVD, no carotid bruits or masses. Cardiac: RRR, no murmurs, rubs, or gallops. No  clubbing, cyanosis, 2+ bilateral lower extremity edema.  Radials 2+, DP/PT 2+ and equal bilaterally.  Respiratory:  Respirations regular and unlabored, clear to auscultation bilaterally. GI: Soft, nontender, nondistended, BS + x 4. MS: no deformity or atrophy. Skin: warm and dry, no rash. Neuro:  Strength and sensation are intact. Psych: AAOx3.  Normal affect.  Labs    Chemistry Recent Labs  Lab 09/29/23 1836 10/01/23 0826 10/04/23 0428 10/04/23 1532 10/05/23 0455  NA 135   < > 134* 137 136  K 4.4   < > 3.7 3.6 3.7  CL 101   < > 98 95* 96*  CO2 26   < > 28 31 31   GLUCOSE 221*   < > 214* 197* 159*  BUN 27*   < > 24* 23 25*  CREATININE 1.83*   < > 1.67* 1.58* 1.74*  CALCIUM  9.0   < > 8.7* 9.1 8.7*  PROT 7.0  --   --   --   --   ALBUMIN 2.8*  --   --   --   --   AST 42*  --   --   --   --   ALT 24  --   --   --   --   ALKPHOS 54  --   --   --   --  BILITOT 1.1  --   --   --   --   GFRNONAA 27*   < > 30* 32* 29*  ANIONGAP 8   < > 8 11 9    < > = values in this interval not displayed.     Hematology Recent Labs  Lab 10/01/23 0826 10/02/23 0754 10/03/23 0415  WBC 7.3 7.1 6.8  RBC 3.77* 3.59* 3.49*  HGB 10.9* 10.4* 10.1*  HCT 35.0* 31.9* 31.6*  MCV 92.8 88.9 90.5  MCH 28.9 29.0 28.9  MCHC 31.1 32.6 32.0  RDW 14.9 14.6 14.7  PLT 628* 571* 532*    Cardiac Enzymes  Recent Labs  Lab 09/29/23 1836  TROPONINIHS 31*      BNP    Component Value Date/Time   BNP 784.0 (H) 09/29/2023 1836    Lipids  Lab Results  Component Value Date   CHOL 129 12/13/2021   HDL 45.40 12/13/2021   LDLCALC 40 12/15/2020   LDLDIRECT 46.0 12/13/2021   TRIG 215.0 (H) 12/13/2021   CHOLHDL 3 12/13/2021    HbA1c  Lab Results  Component Value Date   HGBA1C 9.7 08/01/2023    Radiology    No results found.   Telemetry    rsr - Personally Reviewed  Cardiac Studies   09/30/2023 Echo complete 1. Left ventricular ejection fraction, by estimation, is 60 to 65%. The  left  ventricle has normal function. The left ventricle has no regional  wall motion abnormalities. There is moderate left ventricular hypertrophy.  Left ventricular diastolic  parameters are consistent with Grade I diastolic dysfunction (impaired  relaxation). Elevated left atrial pressure.   2. Right ventricular systolic function is normal. The right ventricular  size is normal. There is severely elevated pulmonary artery systolic  pressure. The estimated right ventricular systolic pressure is 80.0 mmHg.   3. Right atrial size was mildly dilated.   4. The mitral valve is normal in structure. Trivial mitral valve  regurgitation. No evidence of mitral stenosis.   5. The aortic valve is tricuspid. Aortic valve regurgitation is not  visualized. No aortic stenosis is present.   6. The inferior vena cava is dilated in size with <50% respiratory  variability, suggesting right atrial pressure of 15 mmHg.   Patient Profile     83 y.o. female with a hx of arthritis, anxiety, essential hypertension, venous insufficiency/chronic lower extremity edema, mixed hyperlipidemia, uncontrolled type 2 diabetes, CKD stage IIIb, morbid obesity, chronic diastolic heart failure, and peripheral artery disease who is being seen for the continued evaluation of acute on chronic diastolic heart failure.   Assessment & Plan    1.  Acute on chronic HFpEF/pulmonary hypertension:  EF 60-65% by echo this admission w/ mod LVH and GrI DD.  RVSP 80 mmHg. RA est 15 mmHg.  Switched to Lasix  infusion on April 17 with good response.  Feels as though tightness in legs and abdomen is improving, though she remains edematous.  -1.16 L yesterday.  Weight down 0.9 kg.  As previously noted, dry weight last year was 115 to 117 kg and more recently, she has been trending in the 130s.  Currently 126.9.  BUN and creatinine relatively stable over the past week at 25/1.74.  Continue with Lasix  infusion at 20 mg/h.  Entresto  maxed out last night.   Continue to follow blood pressures.  Increasing spironolactone .  Continue hydralazine .  Nitrate caused headache.  Poor SGLT2 inhibitor candidate.  2.  Pulmonary hypertension: Estimated PA pressure 80 mmHg on  most recent echo.  Suspect primarily group 2 potentially with group 3 component as well.  Continue diuresis.  3.  Stage IIIb chronic kidney disease: Creatinine relatively stable.  Follow-up.  4.  Primary hypertension: Blood pressures remain elevated.  See 1.  Entresto  increased last night..  Continue hydralazine , diuresis, spironolactone , and carvedilol .  At this time, pressure remains elevated and unable to wean clonidine .  Will titrate spironolactone .  5.  Type 2 diabetes mellitus: Poorly controlled with an A1c of 9.7.  Insulin  management per medicine team.  6.  Hypokalemia: Potassium stable at 3.7.  Will titrate spironolactone -follow-up.  Signed, Laneta Pintos, NP  10/05/2023, 12:17 PM    For questions or updates, please contact   Please consult [email or web address] for contact info under Cardiology/STEMI.

## 2023-10-05 NOTE — Progress Notes (Signed)
 Progress Note   Patient: Alexis Farmer WUJ:[id number] DOB: [birth year] DOA: 09/29/2023     5 DOS: the patient was seen and examined on 10/05/2023   Brief hospital course:  Alexis Farmer is a 83 y.o. female with medical history significant for monoclonal B-cell lymphoma, stage IIIb CKD, DM, HTN, HFpEF, chronic pain, obesity, immobility , hospitalized in February with a UTI, and in November with RSV who presents by EMS with a 2-week history of shortness of breath.  She states since her RSV infection her breathing never got quite right however over the past few days she has had worsening shortness of breath, with orthopnea and lower extremity edema. ED course and data review: SBP in the 200s HB at 11.6 and platelets 670. Creatinine slightly above baseline at 1.83 Glucose 221 EKG, personally viewed and interpreted showing sinus at 71 with LVH and no acute ST-T wave changes. Chest x-ray with findings suggestive of CHF Patient given IV Lasix  60 mg Hospitalist consulted for admission.      Assessment and Plan:  * Acute on chronic heart failure with preserved ejection fraction (HFpEF) (HCC) Hypertensive urgency Elevated troponin Myocardial injury Elevated troponin likely related to demand ischemia from CHF and elevated BP  Patient denies having any chest pain and shortness of breath has improved.   Weaned from 4 >> 2 L/min O2 >> room air --Cardiology, AHF team following, appreciate management --Initially on IV Lasix  BID dosing --Transitioned to Lasix  drip 4/17, currently at 20 mg/hr Net IO Since Admission: -3,149 mL [10/05/23 1223] (not accurate) --May need to place Foley for accurate Io's  --Continue hydralazine  100 mg 3 times daily, clonidine  0.2 mg twice daily for now, can hopefully wean clonidine  over the weeked  --Coreg  reduced to 6.25 mg BID  --Imdur  stopped due to headaches --Spironolactone  resumed  --Started on Entresto  --Wean off oxygen as tolerated  Hypertensive Urgency  -- BP's severely elevated with systolic 200 Uncontrolled and may be rebound since patient was on clonidine  but has not been resumed --Spironolactone  and Avapro  are on hold for AKI - resume when renal function permits --Cardiology added Imdur  30 BID and spironolactone  25 daily --Clonidine  increased to 0.2 mg --Hydralazine  100 mg TID --Bystolic  was changed to Coreg  25 mg BID --Monitor Bps closely and titrate regimen  Uncontrolled type 2 diabetes mellitus with hyperglycemia, with long-term current with renal complications A1c 9.7% Patient on NPH and regular insulin  at home --Reduced home basal dose 30 units >> 10 daily  >> increase to 15 units daily --Sliding scale Novolog , moderate --Add 3 units Novolog  TID WC --Carb modified diet   AKI superimposed on CKD stage 3b  Creatinine 1.88 up from baseline of 1.4 Monitor for worsening renal function in view of IV diuretic therapy --Hold spironolactone  and Avapro  --Monitor renal function closely   Obesity, Class III, BMI 40-49.9 (morbid obesity) (HCC) Complicating factor to overall prognosis and care Lifestyle modification and exercise has been discussed with patient in detail      Subjective: Pt in recliner when seen this AM, in better spirits.  Pt talks about her new roommate admitted yesterday has really lifted her spirits, came to hold her hand during a blood draw.   Pt reports overall feeling much better, feels encouraged by her improvement.  States prior to yesterday, she "didn't care", now getting appetite back and feeling much better.     Physical Exam: Vitals:   10/05/23 0041 10/05/23 0406 10/05/23 0500 10/05/23 0833  BP: (!) 142/50 (!) 160/57  Aaron Aas)  179/48  Pulse: 63 61  66  Resp: 20 18  16   Temp: 98.4 F (36.9 C) 97.9 F (36.6 C)  99 F (37.2 C)  TempSrc:    Oral  SpO2: 100% 97%  99%  Weight:   126.9 kg   Height:       General exam: awake & alert, in recliner, no acute distress, obese, in good spirits today HEENT:  moist mucus membranes, hearing grossly normal  Respiratory system: CTAB diminished bases, no wheezes, normal respiratory effort. Cardiovascular system: normal S1/S2, RRR, persistent 2+ BLE pitting edema is slowly improving, facial and hand edema has significantly improved.   Gastrointestinal system: soft, NT, ND Central nervous system: A&O x3. no gross focal neurologic deficits, normal speech Psychiatry: normal mood, congruent affect, judgement and insight appear normal     Data Reviewed: Notable labs --   Glucose 159 BUN 25 Cr 1.73 >> 1.64 >> 1.67 >> 1.58 >> 1.74 Ca 8.7  Last Hbg stable 10.4 >> 10.1     Family Communication: Plan of care was discussed with patient in detail.  No family at bedside, pt declined need for me to call anyone at this time.  Disposition: Status is: Inpatient Remains inpatient appropriate because: Remains on Lasix  drip     Planned Discharge Destination: SNF recommended      Time spent: 40 minutes  Author: Montey Apa, DO 10/05/2023 12:23 PM  For on call review [email or web address].

## 2023-10-05 NOTE — Plan of Care (Signed)
  Problem: Education: Goal: Individualized Educational Video(s) Outcome: Progressing   Problem: Coping: Goal: Ability to adjust to condition or change in health will improve Outcome: Progressing   Problem: Fluid Volume: Goal: Ability to maintain a balanced intake and output will improve Outcome: Progressing   Problem: Health Behavior/Discharge Planning: Goal: Ability to identify and utilize available resources and services will improve Outcome: Progressing Goal: Ability to manage health-related needs will improve Outcome: Progressing   Problem: Metabolic: Goal: Ability to maintain appropriate glucose levels will improve Outcome: Progressing   Problem: Nutritional: Goal: Maintenance of adequate nutrition will improve Outcome: Progressing Goal: Progress toward achieving an optimal weight will improve Outcome: Progressing   Problem: Skin Integrity: Goal: Risk for impaired skin integrity will decrease Outcome: Progressing   Problem: Tissue Perfusion: Goal: Adequacy of tissue perfusion will improve Outcome: Progressing   Problem: Education: Goal: Knowledge of General Education information will improve Description: Including pain rating scale, medication(s)/side effects and non-pharmacologic comfort measures Outcome: Progressing   Problem: Health Behavior/Discharge Planning: Goal: Ability to manage health-related needs will improve Outcome: Progressing   Problem: Clinical Measurements: Goal: Ability to maintain clinical measurements within normal limits will improve Outcome: Progressing Goal: Will remain free from infection Outcome: Progressing Goal: Diagnostic test results will improve Outcome: Progressing Goal: Respiratory complications will improve Outcome: Progressing Goal: Cardiovascular complication will be avoided Outcome: Progressing   Problem: Activity: Goal: Risk for activity intolerance will decrease Outcome: Progressing   Problem: Nutrition: Goal:  Adequate nutrition will be maintained Outcome: Progressing   Problem: Coping: Goal: Level of anxiety will decrease Outcome: Progressing   Problem: Elimination: Goal: Will not experience complications related to bowel motility Outcome: Progressing Goal: Will not experience complications related to urinary retention Outcome: Progressing   Problem: Pain Managment: Goal: General experience of comfort will improve and/or be controlled Outcome: Progressing   Problem: Safety: Goal: Ability to remain free from injury will improve Outcome: Progressing   Problem: Skin Integrity: Goal: Risk for impaired skin integrity will decrease Outcome: Progressing   Problem: Education: Goal: Ability to demonstrate management of disease process will improve Outcome: Progressing Goal: Ability to verbalize understanding of medication therapies will improve Outcome: Progressing Goal: Individualized Educational Video(s) Outcome: Progressing   Problem: Activity: Goal: Capacity to carry out activities will improve Outcome: Progressing   Problem: Cardiac: Goal: Ability to achieve and maintain adequate cardiopulmonary perfusion will improve Outcome: Progressing

## 2023-10-06 DIAGNOSIS — I10 Essential (primary) hypertension: Secondary | ICD-10-CM | POA: Diagnosis not present

## 2023-10-06 DIAGNOSIS — I5033 Acute on chronic diastolic (congestive) heart failure: Secondary | ICD-10-CM | POA: Diagnosis not present

## 2023-10-06 DIAGNOSIS — E1121 Type 2 diabetes mellitus with diabetic nephropathy: Secondary | ICD-10-CM | POA: Diagnosis not present

## 2023-10-06 DIAGNOSIS — Z794 Long term (current) use of insulin: Secondary | ICD-10-CM | POA: Diagnosis not present

## 2023-10-06 LAB — GLUCOSE, CAPILLARY
Glucose-Capillary: 164 mg/dL — ABNORMAL HIGH (ref 70–99)
Glucose-Capillary: 173 mg/dL — ABNORMAL HIGH (ref 70–99)
Glucose-Capillary: 193 mg/dL — ABNORMAL HIGH (ref 70–99)
Glucose-Capillary: 199 mg/dL — ABNORMAL HIGH (ref 70–99)
Glucose-Capillary: 254 mg/dL — ABNORMAL HIGH (ref 70–99)

## 2023-10-06 LAB — MAGNESIUM: Magnesium: 1.9 mg/dL (ref 1.7–2.4)

## 2023-10-06 LAB — BASIC METABOLIC PANEL WITH GFR
Anion gap: 13 (ref 5–15)
BUN: 25 mg/dL — ABNORMAL HIGH (ref 8–23)
CO2: 31 mmol/L (ref 22–32)
Calcium: 8.8 mg/dL — ABNORMAL LOW (ref 8.9–10.3)
Chloride: 93 mmol/L — ABNORMAL LOW (ref 98–111)
Creatinine, Ser: 1.77 mg/dL — ABNORMAL HIGH (ref 0.44–1.00)
GFR, Estimated: 28 mL/min — ABNORMAL LOW (ref >60)
Glucose, Bld: 184 mg/dL — ABNORMAL HIGH (ref 70–99)
Potassium: 3.6 mmol/L (ref 3.5–5.1)
Sodium: 137 mmol/L (ref 135–145)

## 2023-10-06 MED ORDER — POTASSIUM CHLORIDE CRYS ER 20 MEQ PO TBCR
40.0000 meq | EXTENDED_RELEASE_TABLET | Freq: Once | ORAL | Status: AC
  Administered 2023-10-06: 40 meq via ORAL
  Filled 2023-10-06: qty 2

## 2023-10-06 MED ORDER — METOLAZONE 2.5 MG PO TABS
2.5000 mg | ORAL_TABLET | Freq: Once | ORAL | Status: AC
  Administered 2023-10-06: 2.5 mg via ORAL
  Filled 2023-10-06: qty 1

## 2023-10-06 MED ORDER — SACUBITRIL-VALSARTAN 49-51 MG PO TABS
1.0000 | ORAL_TABLET | Freq: Two times a day (BID) | ORAL | Status: DC
  Administered 2023-10-06 – 2023-10-09 (×6): 1 via ORAL
  Filled 2023-10-06 (×6): qty 1

## 2023-10-06 MED ORDER — CLONIDINE HCL 0.1 MG PO TABS
0.1000 mg | ORAL_TABLET | Freq: Two times a day (BID) | ORAL | Status: DC
  Administered 2023-10-06: 0.1 mg via ORAL
  Filled 2023-10-06: qty 1

## 2023-10-06 MED ORDER — SPIRONOLACTONE 25 MG PO TABS
25.0000 mg | ORAL_TABLET | Freq: Every day | ORAL | Status: DC
  Administered 2023-10-07 – 2023-10-13 (×7): 25 mg via ORAL
  Filled 2023-10-06 (×7): qty 1

## 2023-10-06 MED ORDER — POTASSIUM CHLORIDE CRYS ER 20 MEQ PO TBCR
40.0000 meq | EXTENDED_RELEASE_TABLET | Freq: Every evening | ORAL | Status: DC
  Administered 2023-10-06 – 2023-10-10 (×5): 40 meq via ORAL
  Filled 2023-10-06 (×5): qty 2

## 2023-10-06 NOTE — Progress Notes (Signed)
 Progress Note   Patient: Alexis Farmer [id number]:[id number] DOB: 10/03/1940 DOA: 09/29/2023     6 DOS: the patient was seen and examined on 10/06/2023   Brief hospital course:  "Tasha Pesce is a 83 y.o. female with medical history significant for monoclonal B-cell lymphoma, stage IIIb CKD, DM, HTN, HFpEF, chronic pain, obesity, immobility , hospitalized in February with a UTI, and in November with RSV who presents by EMS with a 2-week history of shortness of breath.  She states since her RSV infection her breathing never got quite right however over the past few days she has had worsening shortness of breath, with orthopnea and lower extremity edema. ED course and data review: SBP in the 200s HB at 11.6 and platelets 670. Creatinine slightly above baseline at 1.83 Glucose 221 EKG, personally viewed and interpreted showing sinus at 71 with LVH and no acute ST-T wave changes. Chest x-ray with findings suggestive of CHF Patient given IV Lasix  60 mg Hospitalist consulted for admission. "    Further hospital course and management as outlined below.    Assessment and Plan:  * Acute on chronic heart failure with preserved ejection fraction (HFpEF) (HCC) Hypertensive urgency Elevated troponin Myocardial injury Elevated troponin likely related to demand ischemia from CHF and elevated BP  Patient denies having any chest pain and shortness of breath has improved.   Weaned from 4 >> 2 L/min O2 >> room air --Cardiology, AHF team following, appreciate management --Initially on IV Lasix  BID dosing --Transitioned to Lasix  drip 4/17, currently at 20 mg/hr Net IO Since Admission: -4,658.96 mL [10/06/23 1402] (not accurate) --May need to place Foley for accurate Io's  --Imdur  stopped due to headaches --Wean off oxygen as tolerated --CHF GDMT & HTN meds as below --Strict Io's and daily weights  Hypertensive Urgency -- BP's severely elevated with systolic 200 Uncontrolled and may be rebound  since patient was on clonidine  but has not been resumed Hypotension -- 4/20 low BP's after escalation of meds past few days --Cardiology following --Clonidine  has been d/c'd today --On Lasix  drip as above --Coreg  reduced to 6.25 mg BID --Spironolactone  25 mg daily --Entresto  reduced to 49-51 mg BID  Uncontrolled type 2 diabetes mellitus with hyperglycemia, with long-term current with renal complications A1c 9.7% Patient on NPH and regular insulin  at home --Reduced home basal dose 30 units >> 10 daily  >> increase to 15 units daily --Sliding scale Novolog , moderate --Added 3 units Novolog  TID WC --Carb modified diet   AKI superimposed on CKD stage 3b  Creatinine 1.88 up from baseline of 1.4 Monitor for worsening renal function in view of IV diuretic therapy --Hold spironolactone  and Avapro  --Monitor renal function closely   Obesity, Class III, BMI 40-49.9 (morbid obesity) (HCC) Complicating factor to overall prognosis and care Lifestyle modification and exercise has been discussed with patient in detail      Subjective: Pt in recliner when seen this AM. Reports having a difficult night, having bad dreams that people were holding her down and she was yelling.  Roommate confirms her story.  Pt otherwise feels well today, no acute complaints. Continues to with high volume urine output.   Physical Exam: Vitals:   10/06/23 0556 10/06/23 0928 10/06/23 1157 10/06/23 1159  BP:  (!) 101/52 (!) 86/52 (!) 91/32  Pulse:  67 62 61  Resp:      Temp:  99.2 F (37.3 C) 99 F (37.2 C)   TempSrc:  Oral Oral   SpO2:  91% 94%  Weight: 123.1 kg     Height:       General exam: awake & alert, in recliner, no acute distress, obese, in good spirits today HEENT: moist mucus membranes, hearing grossly normal  Respiratory system: CTAB, no wheezes, normal respiratory effort. Cardiovascular system: normal S1/S2, RRR, persistent 2+ BLE pitting edema is slowly improving, facial and hand edema has  significantly improved.   Gastrointestinal system: soft, NT, ND Central nervous system: A&O x3. no gross focal neurologic deficits, normal speech Psychiatry: normal mood, congruent affect, judgement and insight appear normal     Data Reviewed: Notable labs --   Cl 93 Glucose 184 BUN 25 Cr 1.73 >> 1.64 >> 1.67 >> 1.58 >> 1.74 >> 1.77 Ca 8.8  Last Hbg stable 10.4 >> 10.1     Family Communication: Plan of care was discussed with patient in detail.  No family at bedside, pt declined need for me to call anyone at this time.  Disposition: Status is: Inpatient Remains inpatient appropriate because: Remains on Lasix  drip     Planned Discharge Destination: SNF recommended      Time spent: 40 minutes  Author: Montey Apa, DO 10/06/2023 2:02 PM  For on call review [email or web address].

## 2023-10-06 NOTE — Plan of Care (Signed)
  Problem: Education: Goal: Individualized Educational Video(s) Outcome: Progressing   Problem: Coping: Goal: Ability to adjust to condition or change in health will improve Outcome: Progressing   Problem: Fluid Volume: Goal: Ability to maintain a balanced intake and output will improve Outcome: Progressing   Problem: Health Behavior/Discharge Planning: Goal: Ability to identify and utilize available resources and services will improve Outcome: Progressing Goal: Ability to manage health-related needs will improve Outcome: Progressing   Problem: Metabolic: Goal: Ability to maintain appropriate glucose levels will improve Outcome: Progressing   Problem: Nutritional: Goal: Maintenance of adequate nutrition will improve Outcome: Progressing Goal: Progress toward achieving an optimal weight will improve Outcome: Progressing   Problem: Skin Integrity: Goal: Risk for impaired skin integrity will decrease Outcome: Progressing   Problem: Tissue Perfusion: Goal: Adequacy of tissue perfusion will improve Outcome: Progressing   Problem: Education: Goal: Knowledge of General Education information will improve Description: Including pain rating scale, medication(s)/side effects and non-pharmacologic comfort measures Outcome: Progressing   Problem: Health Behavior/Discharge Planning: Goal: Ability to manage health-related needs will improve Outcome: Progressing   Problem: Clinical Measurements: Goal: Ability to maintain clinical measurements within normal limits will improve Outcome: Progressing Goal: Will remain free from infection Outcome: Progressing Goal: Diagnostic test results will improve Outcome: Progressing Goal: Respiratory complications will improve Outcome: Progressing Goal: Cardiovascular complication will be avoided Outcome: Progressing   Problem: Activity: Goal: Risk for activity intolerance will decrease Outcome: Progressing   Problem: Nutrition: Goal:  Adequate nutrition will be maintained Outcome: Progressing   Problem: Coping: Goal: Level of anxiety will decrease Outcome: Progressing   Problem: Elimination: Goal: Will not experience complications related to bowel motility Outcome: Progressing Goal: Will not experience complications related to urinary retention Outcome: Progressing   Problem: Pain Managment: Goal: General experience of comfort will improve and/or be controlled Outcome: Progressing   Problem: Safety: Goal: Ability to remain free from injury will improve Outcome: Progressing   Problem: Skin Integrity: Goal: Risk for impaired skin integrity will decrease Outcome: Progressing   Problem: Education: Goal: Ability to demonstrate management of disease process will improve Outcome: Progressing Goal: Ability to verbalize understanding of medication therapies will improve Outcome: Progressing Goal: Individualized Educational Video(s) Outcome: Progressing   Problem: Activity: Goal: Capacity to carry out activities will improve Outcome: Progressing   Problem: Cardiac: Goal: Ability to achieve and maintain adequate cardiopulmonary perfusion will improve Outcome: Progressing

## 2023-10-06 NOTE — Progress Notes (Addendum)
 Rounding Note    Patient Name: Alexis Farmer Date of Encounter: 10/06/2023  [facility] Cardiologist: Belva Boyden, MD   Subjective   Reports issues with constipation, given multiple items for laxative yesterday with 2 bowel movements, more given today Some leg cramping Reports abdomen less distended, thighs are less tight still with significant lower extremity edema  Inpatient Medications    Scheduled Meds:  aspirin  EC  81 mg Oral Once per day on Monday Thursday   carvedilol   6.25 mg Oral BID WC   cloNIDine   0.1 mg Oral BID   enoxaparin  (LOVENOX ) injection  0.5 mg/kg Subcutaneous Q24H   hydrALAZINE   100 mg Oral Q8H   insulin  aspart  0-15 Units Subcutaneous TID WC   insulin  aspart  3 Units Subcutaneous TID WC   insulin  glargine-yfgn  15 Units Subcutaneous Daily   metolazone   2.5 mg Oral Once   polyethylene glycol  17 g Oral Daily   rosuvastatin   20 mg Oral Daily   sacubitril -valsartan   1 tablet Oral BID   senna-docusate  1 tablet Oral BID   spironolactone   50 mg Oral Daily   Continuous Infusions:  furosemide  (LASIX ) 200 mg in dextrose  5 % 100 mL (2 mg/mL) infusion 20 mg/hr (10/06/23 0600)   PRN Meds: acetaminophen  **OR** acetaminophen , benzonatate , bisacodyl , HYDROcodone -acetaminophen , hydrocortisone , ipratropium-albuterol , LORazepam , ondansetron  **OR** ondansetron  (ZOFRAN ) IV, sodium phosphate , traZODone    Vital Signs    Vitals:   10/05/23 2003 10/06/23 0022 10/06/23 0556 10/06/23 0928  BP:  114/63  (!) 101/52  Pulse:  73  67  Resp: 16 20    Temp:  99.1 F (37.3 C)  99.2 F (37.3 C)  TempSrc:    Oral  SpO2:  97%  91%  Weight:   123.1 kg   Height:        Intake/Output Summary (Last 24 hours) at 10/06/2023 1114 Last data filed at 10/06/2023 0945 Gross per 24 hour  Intake 590.04 ml  Output 2100 ml  Net -1509.96 ml      10/06/2023    5:56 AM 10/05/2023    5:00 AM 10/04/2023    5:00 AM  Last 3 Weights  Weight (lbs) 271 lb 6.2 oz 279 lb 12.2  oz 281 lb 12 oz  Weight (kg) 123.1 kg 126.9 kg 127.8 kg      Telemetry    Normal sinus rhythm- Personally Reviewed  ECG     - Personally Reviewed  Physical Exam   GEN: No acute distress.   Neck: Unable to estimate JVD Cardiac: RRR, no murmurs, rubs, or gallops.  Respiratory: Clear scattered Rales at the bases, 2+ pitting lower extremity edema GI: Soft, nontender, non-distended  MS:  No deformity. Neuro:  Nonfocal  Psych: Normal affect   Labs    High Sensitivity Troponin:   Recent Labs  Lab 09/29/23 1836  TROPONINIHS 31*     Chemistry Recent Labs  Lab 09/29/23 1836 10/01/23 0826 10/03/23 0415 10/04/23 0428 10/04/23 1532 10/05/23 0455 10/06/23 0446  NA 135   < > 136   < > 137 136 137  K 4.4   < > 3.7   < > 3.6 3.7 3.6  CL 101   < > 101   < > 95* 96* 93*  CO2 26   < > 28   < > 31 31 31   GLUCOSE 221*   < > 110*   < > 197* 159* 184*  BUN 27*   < > 25*   < >  23 25* 25*  CREATININE 1.83*   < > 1.64*   < > 1.58* 1.74* 1.77*  CALCIUM  9.0   < > 8.6*   < > 9.1 8.7* 8.8*  MG  --   --  1.9  --   --   --  1.9  PROT 7.0  --   --   --   --   --   --   ALBUMIN 2.8*  --   --   --   --   --   --   AST 42*  --   --   --   --   --   --   ALT 24  --   --   --   --   --   --   ALKPHOS 54  --   --   --   --   --   --   BILITOT 1.1  --   --   --   --   --   --   GFRNONAA 27*   < > 31*   < > 32* 29* 28*  ANIONGAP 8   < > 7   < > 11 9 13    < > = values in this interval not displayed.    Lipids No results for input(s): "CHOL", "TRIG", "HDL", "LABVLDL", "LDLCALC", "CHOLHDL" in the last 168 hours.  Hematology Recent Labs  Lab 10/01/23 0826 10/02/23 0754 10/03/23 0415  WBC 7.3 7.1 6.8  RBC 3.77* 3.59* 3.49*  HGB 10.9* 10.4* 10.1*  HCT 35.0* 31.9* 31.6*  MCV 92.8 88.9 90.5  MCH 28.9 29.0 28.9  MCHC 31.1 32.6 32.0  RDW 14.9 14.6 14.7  PLT 628* 571* 532*   Thyroid  No results for input(s): "TSH", "FREET4" in the last 168 hours.  BNP Recent Labs  Lab 09/29/23 1836  BNP  784.0*    DDimer No results for input(s): "DDIMER" in the last 168 hours.   Radiology    No results found.  Cardiac Studies  Echo   1. Left ventricular ejection fraction, by estimation, is 60 to 65%. The  left ventricle has normal function. The left ventricle has no regional  wall motion abnormalities. There is moderate left ventricular hypertrophy.  Left ventricular diastolic  parameters are consistent with Grade I diastolic dysfunction (impaired  relaxation). Elevated left atrial pressure.   2. Right ventricular systolic function is normal. The right ventricular  size is normal. There is severely elevated pulmonary artery systolic  pressure. The estimated right ventricular systolic pressure is 80.0 mmHg.   3. Right atrial size was mildly dilated.   4. The mitral valve is normal in structure. Trivial mitral valve  regurgitation. No evidence of mitral stenosis.   5. The aortic valve is tricuspid. Aortic valve regurgitation is not  visualized. No aortic stenosis is present.   6. The inferior vena cava is dilated in size with <50% respiratory  variability, suggesting right atrial pressure of 15 mmHg.    Patient Profile     83 y.o. female with a hx of arthritis, anxiety, essential hypertension, venous insufficiency/chronic lower extremity edema, mixed hyperlipidemia, uncontrolled type 2 diabetes, CKD stage IIIb, morbid obesity, chronic diastolic heart failure, and peripheral artery disease who is being seen for the continued evaluation of acute on chronic diastolic heart failure.   Assessment & Plan    Hypertensive heart disease/acute on chronic diastolic CHF, pulmonary hypertension Exacerbated by morbid obesity, chronic renal failure Echocardiogram with EF 60 to 65%,  dilated IVC, elevated right heart pressures, moderate LVH -Tolerating Lasix  infusion, periodic doses of metolazone , slow progress -Per granddaughter, poor diet at home, salty soups, possible underlying lymphedema by  history -Continue Lasix  infusion, will give dose of metolazone    Malignant hypertension Clonidine   0.1 twice daily,  hydralazine  100 mg 3 times daily, carvedilol  6.25 twice daily High dose Entresto   spironolactone  25 Isosorbide  held for headache Continue Lasix  infusion -If blood pressure continues to run low, will hold the clonidine , cut spironolactone  down to 25, Entresto  down to 49/51 twice daily Hydralazine  held for hypotension -Appears she declined hydralazine  this morning  Diabetes type 2 with complications Longstanding history of poorly controlled diabetes, nephropathy A1c 9.7 -Would benefit from outpatient nutrition counseling   Chronic renal failure 3B In the setting of longstanding history of poorly controlled diabetes  renal function stable with GFR around 30    For questions or updates, please contact [facility] Please consult [email or web address] for contact info under        Signed, Aaylah Pokorny, MD  10/06/2023, 11:14 AM

## 2023-10-07 DIAGNOSIS — I5033 Acute on chronic diastolic (congestive) heart failure: Secondary | ICD-10-CM | POA: Diagnosis not present

## 2023-10-07 LAB — RENAL FUNCTION PANEL
Albumin: 2.4 g/dL — ABNORMAL LOW (ref 3.5–5.0)
Anion gap: 11 (ref 5–15)
BUN: 26 mg/dL — ABNORMAL HIGH (ref 8–23)
CO2: 32 mmol/L (ref 22–32)
Calcium: 8.1 mg/dL — ABNORMAL LOW (ref 8.9–10.3)
Chloride: 87 mmol/L — ABNORMAL LOW (ref 98–111)
Creatinine, Ser: 1.86 mg/dL — ABNORMAL HIGH (ref 0.44–1.00)
GFR, Estimated: 26 mL/min — ABNORMAL LOW (ref >60)
Glucose, Bld: 288 mg/dL — ABNORMAL HIGH (ref 70–99)
Phosphorus: 2.6 mg/dL (ref 2.5–4.6)
Potassium: 3.3 mmol/L — ABNORMAL LOW (ref 3.5–5.1)
Sodium: 130 mmol/L — ABNORMAL LOW (ref 135–145)

## 2023-10-07 LAB — BASIC METABOLIC PANEL WITH GFR
Anion gap: 9 (ref 5–15)
BUN: 27 mg/dL — ABNORMAL HIGH (ref 8–23)
CO2: 31 mmol/L (ref 22–32)
Calcium: 8.3 mg/dL — ABNORMAL LOW (ref 8.9–10.3)
Chloride: 95 mmol/L — ABNORMAL LOW (ref 98–111)
Creatinine, Ser: 1.9 mg/dL — ABNORMAL HIGH (ref 0.44–1.00)
GFR, Estimated: 26 mL/min — ABNORMAL LOW (ref >60)
Glucose, Bld: 272 mg/dL — ABNORMAL HIGH (ref 70–99)
Potassium: 3.9 mmol/L (ref 3.5–5.1)
Sodium: 135 mmol/L (ref 135–145)

## 2023-10-07 LAB — GLUCOSE, CAPILLARY
Glucose-Capillary: 270 mg/dL — ABNORMAL HIGH (ref 70–99)
Glucose-Capillary: 218 mg/dL — ABNORMAL HIGH (ref 70–99)
Glucose-Capillary: 107 mg/dL — ABNORMAL HIGH (ref 70–99)
Glucose-Capillary: 219 mg/dL — ABNORMAL HIGH (ref 70–99)

## 2023-10-07 MED ORDER — ACETAZOLAMIDE 250 MG PO TABS
500.0000 mg | ORAL_TABLET | Freq: Two times a day (BID) | ORAL | Status: AC
  Administered 2023-10-07 (×2): 500 mg via ORAL
  Filled 2023-10-07 (×2): qty 2

## 2023-10-07 MED ORDER — INSULIN GLARGINE-YFGN 100 UNIT/ML ~~LOC~~ SOLN
15.0000 [IU] | Freq: Every day | SUBCUTANEOUS | Status: DC
  Administered 2023-10-08 – 2023-10-10 (×3): 15 [IU] via SUBCUTANEOUS
  Filled 2023-10-07 (×3): qty 0.15

## 2023-10-07 MED ORDER — ACETAZOLAMIDE SODIUM 500 MG IJ SOLR
500.0000 mg | Freq: Two times a day (BID) | INTRAMUSCULAR | Status: DC
  Filled 2023-10-07: qty 500

## 2023-10-07 MED ORDER — ENOXAPARIN SODIUM 30 MG/0.3ML IJ SOSY
30.0000 mg | PREFILLED_SYRINGE | INTRAMUSCULAR | Status: DC
  Administered 2023-10-08 – 2023-10-10 (×3): 30 mg via SUBCUTANEOUS
  Filled 2023-10-07 (×3): qty 0.3

## 2023-10-07 MED ORDER — POTASSIUM CHLORIDE CRYS ER 20 MEQ PO TBCR
40.0000 meq | EXTENDED_RELEASE_TABLET | Freq: Once | ORAL | Status: AC
  Administered 2023-10-07: 40 meq via ORAL
  Filled 2023-10-07: qty 2

## 2023-10-07 MED ORDER — METOLAZONE 2.5 MG PO TABS
2.5000 mg | ORAL_TABLET | Freq: Once | ORAL | Status: AC
  Administered 2023-10-07: 2.5 mg via ORAL
  Filled 2023-10-07: qty 1

## 2023-10-07 MED ORDER — INSULIN GLARGINE-YFGN 100 UNIT/ML ~~LOC~~ SOLN: 20.0000 [IU] | Freq: Every day | SUBCUTANEOUS | Status: DC

## 2023-10-07 NOTE — Progress Notes (Addendum)
 Progress Note   Patient: Alexis Farmer AVW:[id number] DOB: [birth year] DOA: 09/29/2023     7 DOS: the patient was seen and examined on 10/07/2023   Brief hospital course:  "Ayleah Cassata is a 83 y.o. female with medical history significant for monoclonal B-cell lymphoma, stage IIIb CKD, DM, HTN, HFpEF, chronic pain, obesity, immobility , hospitalized in February with a UTI, and in November with RSV who presents by EMS with a 2-week history of shortness of breath.  She states since her RSV infection her breathing never got quite right however over the past few days she has had worsening shortness of breath, with orthopnea and lower extremity edema. ED course and data review: SBP in the 200s HB at 11.6 and platelets 670. Creatinine slightly above baseline at 1.83 Glucose 221 EKG, personally viewed and interpreted showing sinus at 71 with LVH and no acute ST-T wave changes. Chest x-ray with findings suggestive of CHF Patient given IV Lasix  60 mg Hospitalist consulted for admission. "    Further hospital course and management as outlined below.    Assessment and Plan:  * Acute on chronic heart failure with preserved ejection fraction (HFpEF) (HCC) Hypertensive urgency Elevated troponin Myocardial injury Elevated troponin likely related to demand ischemia from CHF and elevated BP  Patient denies having any chest pain and shortness of breath has improved.   Weaned from 4 >> 2 L/min O2 >> room air --Cardiology, AHF team following, appreciate management --Initially on IV Lasix  BID dosing --Transitioned to Lasix  drip 4/17, currently at 20 mg/hr Net IO Since Admission: -4,998.96 mL [10/07/23 1228] (not accurate) --Cardiology adding PO diamox  500 mg bid + repeat 2.5 mg of metolazone  x 1 to augment diuresis  --Place UNNA boots --May need to place Foley for accurate Io's  --Imdur  stopped due to headaches --Wean off oxygen as tolerated --CHF GDMT & HTN meds as below --Strict Io's and  daily weights --Possible right heart cath tomorrow  Hypertensive Urgency -- BP's severely elevated with systolic 200 Uncontrolled and may be rebound since patient was on clonidine  but has not been resumed Hypotension -- 4/20 low BP's after escalation of meds past few days --Cardiology following --Clonidine  has been d/c'd today --On Lasix  drip as above --Coreg  reduced to 6.25 mg BID --Spironolactone  25 mg daily --Entresto  reduced to 49-51 mg BID  Uncontrolled type 2 diabetes mellitus with hyperglycemia, with long-term current with renal complications A1c 9.7% Patient on NPH and regular insulin  at home --Increase Semglee  15 >> 20 units --3 units Novolog  TID WC + moderate sliding scale --Carb modified diet   AKI superimposed on CKD stage 3b  Creatinine 1.88 up from baseline of 1.4 Monitor for worsening renal function in view of IV diuretic therapy --Hold spironolactone  and Avapro  --Monitor renal function closely   Obesity, Class III, BMI 40-49.9 (morbid obesity) (HCC) Complicating factor to overall prognosis and care Lifestyle modification and exercise has been discussed with patient in detail      Subjective: Pt awake resting in bed, just returned to bed from chair.  She reports improving appetite.  Moving around easier since admission with swelling going down.  No other acute complaints.    Physical Exam: Vitals:   10/07/23 0009 10/07/23 0511 10/07/23 0535 10/07/23 0743  BP: (!) 101/44 (!) 143/59  121/77  Pulse: 60 69  76  Resp: 20 20    Temp: 98.5 F (36.9 C) 98.5 F (36.9 C)  98.8 F (37.1 C)  TempSrc:      SpO2:  91% 94%  96%  Weight:   124.2 kg   Height:       General exam: awake & alert, no acute distress, obese, in good spirits today HEENT: moist mucus membranes, hearing grossly normal  Respiratory system: CTAB, no wheezes, normal respiratory effort. Cardiovascular system: normal S1/S2, RRR, severe 2-3+ BLE pitting edema is slowly improving, facial and hand  edema has significantly improved.   Gastrointestinal system: soft, NT, ND Central nervous system: A&O x3. no gross focal neurologic deficits, normal speech Psychiatry: normal mood, congruent affect, judgement and insight appear normal     Data Reviewed: Notable labs --   Cl 95 Glucose 272 BUN 27 Cr 1.73 >> 1.64 >> 1.67 >> 1.58 >> 1.74 >> 1.77 >> 1.90 Ca 8.3  Last Hbg stable 10.4 >> 10.1     Family Communication: Plan of care was discussed with patient in detail.  No family at bedside, pt declined need for me to call anyone at this time.  Disposition: Status is: Inpatient Remains inpatient appropriate because: Remains on Lasix  drip     Planned Discharge Destination: SNF recommended      Time spent: 40 minutes  Author: Montey Apa, DO 10/07/2023 12:28 PM  For on call review [email or web address].

## 2023-10-07 NOTE — Plan of Care (Signed)
  Problem: Education: Goal: Individualized Educational Video(s) Outcome: Progressing   Problem: Coping: Goal: Ability to adjust to condition or change in health will improve Outcome: Progressing   Problem: Fluid Volume: Goal: Ability to maintain a balanced intake and output will improve Outcome: Progressing   Problem: Health Behavior/Discharge Planning: Goal: Ability to identify and utilize available resources and services will improve Outcome: Progressing   Problem: Metabolic: Goal: Ability to maintain appropriate glucose levels will improve Outcome: Progressing   Problem: Nutritional: Goal: Maintenance of adequate nutrition will improve Outcome: Progressing

## 2023-10-07 NOTE — Inpatient Diabetes Management (Addendum)
 Inpatient Diabetes Program Recommendations  AACE/ADA: New Consensus Statement on Inpatient Glycemic Control   Target Ranges:  Prepandial:   less than 140 mg/dL      Peak postprandial:   less than 180 mg/dL (1-2 hours)      Critically ill patients:  140 - 180 mg/dL    Latest Reference Range & Units 10/06/23 07:57 10/06/23 11:44 10/06/23 16:38 10/06/23 21:01 10/07/23 07:44  Glucose-Capillary 70 - 99 mg/dL 643 (H) 329 (H) 518 (H) 164 (H) 270 (H)   Review of Glycemic Control  Diabetes history: DM2 Outpatient Diabetes medications: NPH 20 units QAM, NPH 15 units QPM, Regular 0-8 units TID Current orders for Inpatient glycemic control: Semglee  15 units daily, Novolog  3 units TID with meals, Novolog  0-15 units TID with meals  Inpatient Diabetes Program Recommendations:    Insulin : In reviewing chart, per [facility] patient refused Semglee  15 units on 10/06/23 and refused Novolog  correction and meal coverage insulin  scheduled for 12:00 on 4/20. As a result of not receiving Semglee  on 4/20, glucose noted to be 270 mg/dl today.  Patient has already received Semglee  15 units this morning.  Thanks, Beacher Limerick, RN, MSN, CDCES Diabetes Coordinator Inpatient Diabetes Program [phone] (Team Pager from 8am to 5pm)

## 2023-10-07 NOTE — Progress Notes (Signed)
 PHARMACIST - PHYSICIAN COMMUNICATION  CONCERNING:  Enoxaparin  (Lovenox ) for DVT Prophylaxis    RECOMMENDATION: Patient was prescribed enoxaprin 40mg  q24 hours for VTE prophylaxis.   Filed Weights   10/05/23 0500 10/06/23 0556 10/07/23 0535  Weight: 126.9 kg (279 lb 12.2 oz) 123.1 kg (271 lb 6.2 oz) 124.2 kg (273 lb 13 oz)    Body mass index is 48.5 kg/m.  Estimated Creatinine Clearance: 28.2 mL/min (A) (by C-G formula based on SCr of 1.9 mg/dL (H)).   Based on [facility] policy patient is candidate for enoxaparin  30mg  every 24 hours based on CrCl <76ml/min or Weight <45kg  DESCRIPTION: Pharmacy has adjusted enoxaparin  dose per [facility] policy.  Patient is now receiving enoxaparin  30 mg every 24 hours    Aldrich Lloyd Rodriguez-Guzman PharmD, BCPS 10/07/2023 9:44 AM

## 2023-10-07 NOTE — Progress Notes (Addendum)
 Advanced Heart Failure Rounding Note  PCP-Cardiologist: Belva Boyden, MD   Patient Profile   Alexis Farmer is a 83 y.o. female with a PMH of resistant hypertension, chronic HFpEF, obesity, type 2 diabetes, CKD stage IIIb who is seen today for evaluation of acute on chronic diastolic heart failure at the request of Dr Gollan.   Subjective:    Remains on lasix  gtt at 20/hr + received 2.5 mg of metolazone  x 1 yesterday.  1.4L in UOP yesterday, though likely inaccurate. Had issues w/ purwick. Now fixed. Urine pale yellow.    SCr 1.9 (1.7-1.8 throughout this admission) K 3.9  SBPs 90s-low 100s. Over the weekend, Entresto  reduced to 49-51, Spironolactone  down to 25. Hydral and clonidine  both held.   OOB, sitting up in chair. No current resting dyspnea but c/w orthopnea at night. Also complains of abdominal discomfort/ early saeity. Mild nausea. No vomiting.  Remains markedly edematous w/ edema up to thighs.    Objective:   Weight Range: 124.2 kg Body mass index is 48.5 kg/m.   Vital Signs:   Temp:  [98.5 F (36.9 C)-99.6 F (37.6 C)] 98.5 F (36.9 C) (04/21 0511) Pulse Rate:  [60-69] 69 (04/21 0511) Resp:  [20] 20 (04/21 0511) BP: (86-143)/(32-65) 143/59 (04/21 0511) SpO2:  [91 %-95 %] 94 % (04/21 0511) Weight:  [124.2 kg] 124.2 kg (04/21 0535) Last BM Date : 10/05/23  Weight change: Filed Weights   10/05/23 0500 10/06/23 0556 10/07/23 0535  Weight: 126.9 kg 123.1 kg 124.2 kg    Intake/Output:   Intake/Output Summary (Last 24 hours) at 10/07/2023 0731 Last data filed at 10/07/2023 0618 Gross per 24 hour  Intake 1060 ml  Output 1400 ml  Net -340 ml      Physical Exam   General:  fatigued appearing, obese, sitting up in chair. No respiratory difficulty HEENT: normal Neck: supple. JVD elevated to ear. Carotids 2+ bilat; no bruits. No lymphadenopathy or thyromegaly appreciated. Cor: PMI nondisplaced. Regular rate & rhythm. No rubs, gallops or  murmurs. Lungs: decreased BS at the bases  Abdomen: obese, soft, nontender, +distended.  Extremities: no cyanosis, clubbing, rash, 3+ b/l LE up to thighs  Neuro: alert & oriented x 3, cranial nerves grossly intact. moves all 4 extremities w/o difficulty. Affect pleasant.  Telemetry   NSR 80s, personally reviewed    Assessment/Plan   1. Acute on chronic diastolic heart failure:  - With biventricular involvement and pulmonary hypertension as outlined below, LVEF 60 to 65% - remains severely volume overloaded. Diuresing w/ lasix  gtt though not robust.  - continue Lasix  gtt at 20 mg/hr - add PO diamox  500 mg bid + repeat 2.5 mg of metolazone  x 1 to augment diuresis  - place UNNA boots  - Weights likely inaccurate (bed wts). Needs standing wts   - Continue Entersto 49-51 mg bid (dose reduced due to hypotension)  - Continue Spironolactone  25 mg daily  - Continue Coreg  6.25 mg bid  - Off hydral and clonidine  w/ soft BPs  - fluid restrict 1800 ml/day  - aggressive K supp w/ diuresis. Repeat f/u BMP this PM  - if difficulties diuresing, will plan RHC tomorrow. D/w pt     2. Pulmonary hypertension:  - Estimated PA systolic pressure 80 mmHg on most recent echo - Interestingly RV function is fairly well-preserved and the RV is not dilated - Suspect primarily who group 2 with severely uncontrolled hypertension - Potentially a group 3 component as well - push diuresis  per above. Possible RHC if difficulty diuresing    3. 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 Type 2 Diabetes:  - long standing. Poorly controlled, recent Hgb A1c 9.7  - on insulin  - management per IM    Length of Stay: [address] PA-C  10/07/2023, 7:31 AM  Advanced Heart Failure Team Pager [phone] (M-F; 7a - 5p)  Please contact [facility] for night-coverage  after hours (5p -7a ) and weekends on [email or web address]  Patient seen and examined with the above-signed Advanced Practice Provider and/or Housestaff. I personally reviewed laboratory data, imaging studies and relevant notes. I independently examined the patient and formulated the important aspects of the plan. I have edited the note to reflect any of my changes or salient points. I have personally discussed the plan with the patient and/or family.  Denies CP or SOB. Feels bloated. Diuresis has been sluggish over the weekend despite lasix  gtt at 20  General: Lying in bed with blanket pulled over her head HEENT: normal Neck: supple. JVP to ear  Cor:. Regular rate & rhythm.2/6 TR. Lungs: clear Abdomen: soft, nontender, +distended. Good bowel sounds. Extremities: no cyanosis, clubbing, rash, 3+ edema Neuro: alert & orientedx3, cranial nerves grossly intact. moves all 4 extremities w/o difficulty. Affect pleasant  She remains markedly fluid overloaded with R>>L HF. Suspect she still  has 30 pounds of fluid on her. Will continue lasix  gtt. Add diamox  and metolazone  as above. Low threshold to go to RHC if not picking up. Will need RHC prior to d/c once optimized. Wrap legs.   Jules Oar, MD  11:15 PM

## 2023-10-08 DIAGNOSIS — I5033 Acute on chronic diastolic (congestive) heart failure: Secondary | ICD-10-CM | POA: Diagnosis not present

## 2023-10-08 LAB — GLUCOSE, CAPILLARY
Glucose-Capillary: 217 mg/dL — ABNORMAL HIGH (ref 70–99)
Glucose-Capillary: 230 mg/dL — ABNORMAL HIGH (ref 70–99)
Glucose-Capillary: 217 mg/dL — ABNORMAL HIGH (ref 70–99)
Glucose-Capillary: 70 mg/dL (ref 70–99)
Glucose-Capillary: 175 mg/dL — ABNORMAL HIGH (ref 70–99)

## 2023-10-08 LAB — CBC
HCT: 35.5 % — ABNORMAL LOW (ref 36.0–46.0)
Hemoglobin: 11.7 g/dL — ABNORMAL LOW (ref 12.0–15.0)
MCH: 28.7 pg (ref 26.0–34.0)
MCHC: 33 g/dL (ref 30.0–36.0)
MCV: 87 fL (ref 80.0–100.0)
Platelets: 644 10*3/uL — ABNORMAL HIGH (ref 150–400)
RBC: 4.08 MIL/uL (ref 3.87–5.11)
RDW: 14.4 % (ref 11.5–15.5)
WBC: 10.6 10*3/uL — ABNORMAL HIGH (ref 4.0–10.5)
nRBC: 0 % (ref 0.0–0.2)

## 2023-10-08 LAB — BASIC METABOLIC PANEL WITH GFR
Anion gap: 12 (ref 5–15)
BUN: 27 mg/dL — ABNORMAL HIGH (ref 8–23)
CO2: 32 mmol/L (ref 22–32)
Calcium: 8.7 mg/dL — ABNORMAL LOW (ref 8.9–10.3)
Chloride: 91 mmol/L — ABNORMAL LOW (ref 98–111)
Creatinine, Ser: 1.99 mg/dL — ABNORMAL HIGH (ref 0.44–1.00)
GFR, Estimated: 24 mL/min — ABNORMAL LOW (ref >60)
Glucose, Bld: 202 mg/dL — ABNORMAL HIGH (ref 70–99)
Potassium: 3.8 mmol/L (ref 3.5–5.1)
Sodium: 135 mmol/L (ref 135–145)

## 2023-10-08 MED ORDER — POTASSIUM CHLORIDE CRYS ER 20 MEQ PO TBCR
20.0000 meq | EXTENDED_RELEASE_TABLET | Freq: Once | ORAL | Status: AC
  Administered 2023-10-08: 20 meq via ORAL
  Filled 2023-10-08: qty 1

## 2023-10-08 MED ORDER — ACETAZOLAMIDE 250 MG PO TABS
500.0000 mg | ORAL_TABLET | Freq: Two times a day (BID) | ORAL | Status: AC
  Administered 2023-10-08 (×2): 500 mg via ORAL
  Filled 2023-10-08 (×2): qty 2

## 2023-10-08 MED ORDER — METOLAZONE 5 MG PO TABS
5.0000 mg | ORAL_TABLET | Freq: Once | ORAL | Status: AC
Start: 2023-10-08 — End: 2023-10-08
  Administered 2023-10-08: 5 mg via ORAL
  Filled 2023-10-08: qty 1

## 2023-10-08 MED ORDER — HYDRALAZINE HCL 50 MG PO TABS
50.0000 mg | ORAL_TABLET | Freq: Three times a day (TID) | ORAL | Status: DC
Start: 2023-10-08 — End: 2023-10-14
  Administered 2023-10-08 – 2023-10-14 (×12): 50 mg via ORAL
  Filled 2023-10-08 (×14): qty 1

## 2023-10-08 NOTE — Progress Notes (Signed)
 Occupational Therapy Treatment Patient Details Name: Alexis Farmer MRN: [id number] DOB: 1941-01-16 Today's Date: 10/08/2023   History of present illness Alexis Farmer is a 83 y.o. female with medical history significant for monoclonal B-cell lymphoma, stage IIIb CKD, DM, HTN, HFpEF, chronic pain, obesity, immobility , hospitalized in February with a UTI, and in November with RSV who presents by EMS with a 2-week history of shortness of breath.  She states since her RSV infection her breathing never got quite right however over the past few days she has had worsening shortness of breath, with orthopnea and lower extremity edema. Diagnositc studies completed. Pt admitted for Acute on chronic CHF   OT comments  Upon entering the room, pt supine in bed and agreeable to OT intervention with encouragement. Pt initially asked about toileting concerns and pt reports, " I don't need to use bathroom". Pt then reports that "it burns" and appears to be trying to use purewick that is not currently connected. Pt needing min A for bed mobility. She stands with  min A from standard bed height to check chux pad and to ensure linens are dry. Pt declines ambulation or to transfer to recliner chair. Pt returning to supine with min A. Use of bed features to move pt further up in bed. Call bell and all needed items within reach upon exiting the room.       If plan is discharge home, recommend the following:  A lot of help with walking and/or transfers;A lot of help with bathing/dressing/bathroom;Assistance with cooking/housework;Assist for transportation;Help with stairs or ramp for entrance   Equipment Recommendations  None recommended by OT       Precautions / Restrictions Precautions Precautions: Fall Recall of Precautions/Restrictions: Intact       Mobility Bed Mobility Overal bed mobility: Needs Assistance Bed Mobility: Supine to Sit, Sit to Supine     Supine to sit: Min assist Sit to supine: Min  assist        Transfers Overall transfer level: Needs assistance Equipment used: Rolling walker (2 wheels) Transfers: Sit to/from Stand Sit to Stand: Min assist                 Balance Overall balance assessment: Needs assistance   Sitting balance-Leahy Scale: Good     Standing balance support: Bilateral upper extremity supported, During functional activity, Reliant on assistive device for balance Standing balance-Leahy Scale: Fair                             ADL either performed or assessed with clinical judgement    Extremity/Trunk Assessment Upper Extremity Assessment Upper Extremity Assessment: Generalized weakness   Lower Extremity Assessment Lower Extremity Assessment: Generalized weakness        Vision Patient Visual Report: No change from baseline           Communication Communication Communication: No apparent difficulties   Cognition Arousal: Alert Behavior During Therapy: WFL for tasks assessed/performed Cognition: No apparent impairments                               Following commands: Intact        Cueing   Cueing Techniques: Verbal cues             Pertinent Vitals/ Pain       Pain Assessment Pain Assessment: No/denies pain  Frequency  Min 2X/week        Progress Toward Goals  OT Goals(current goals can now be found in the care plan section)  Progress towards OT goals: Progressing toward goals      AM-PAC OT "6 Clicks" Daily Activity     Outcome Measure   Help from another person eating meals?: None Help from another person taking care of personal grooming?: A Little Help from another person toileting, which includes using toliet, bedpan, or urinal?: A Little Help from another person bathing (including washing, rinsing, drying)?: A Little Help from another person to put on and taking off regular upper body clothing?: A Little Help from another person to put on and taking off  regular lower body clothing?: A Little 6 Click Score: 19    End of Session Equipment Utilized During Treatment: Rolling walker (2 wheels)  OT Visit Diagnosis: Unsteadiness on feet (R26.81);Repeated falls (R29.6);Muscle weakness (generalized) (M62.81)   Activity Tolerance Patient tolerated treatment well   Patient Left in bed;with call bell/phone within reach;with bed alarm set   Nurse Communication Mobility status        Time: 1610-9604 OT Time Calculation (min): 20 min  Charges: OT General Charges $OT Visit: 1 Visit OT Treatments $Therapeutic Activity: 8-22 mins  George Kinder, MS, OTR/L , CBIS ascom [phone]  10/08/23, 3:01 PM

## 2023-10-08 NOTE — Progress Notes (Signed)
 Physical Therapy Treatment Patient Details Name: Alexis Farmer MRN: [id number] DOB: Dec 10, 1940 Today's Date: 10/08/2023   History of Present Illness Alexis Farmer is a 83 y.o. female with medical history significant for monoclonal B-cell lymphoma, stage IIIb CKD, DM, HTN, HFpEF, chronic pain, obesity, immobility , hospitalized in February with a UTI, and in November with RSV who presents by EMS with a 2-week history of shortness of breath.  She states since her RSV infection her breathing never got quite right however over the past few days she has had worsening shortness of breath, with orthopnea and lower extremity edema. Diagnositc studies completed. Pt admitted for Acute on chronic CHF    PT Comments  Pt still requires minA for transfers from EOB, does better from elevated surface. Pt able to partake in 2 AMB efforts this date, however she remains limited to <46ft due to fatigue and moderate exertion. Pt declines set up in chair, but is agreeable to sit EOB for a time. DTR visiting at bedside at EOS. Will continue to follow.     If plan is discharge home, recommend the following: A lot of help with walking and/or transfers;A lot of help with bathing/dressing/bathroom;Assistance with cooking/housework;Direct supervision/assist for medications management;Assist for transportation   Can travel by private vehicle     No  Equipment Recommendations  None recommended by PT    Recommendations for Other Services       Precautions / Restrictions Precautions Precautions: Fall Recall of Precautions/Restrictions: Intact Restrictions Weight Bearing Restrictions Per Provider Order: No     Mobility  Bed Mobility Overal bed mobility: Needs Assistance Bed Mobility: Supine to Sit     Supine to sit: Min assist, HOB elevated          Transfers Overall transfer level: Needs assistance Equipment used:  (wide RW at bedside) Transfers: Sit to/from Stand Sit to Stand: Min assist            General transfer comment: weakness precludes rising    Ambulation/Gait Ambulation/Gait assistance: Contact guard assist Gait Distance (Feet): 12 Feet Assistive device:  (bari RW) Gait Pattern/deviations: Step-through pattern, Decreased step length - right, Decreased step length - left, Wide base of support       General Gait Details: AMB from 1 EOB to the other, limited by fatigue, then repeats the walk after a brief seated rest break   Stairs             Wheelchair Mobility     Tilt Bed    Modified Rankin (Stroke Patients Only)       Balance                                            Communication    Cognition Arousal: Alert Behavior During Therapy: WFL for tasks assessed/performed   PT - Cognitive impairments: No apparent impairments, History of cognitive impairments                                Cueing    Exercises      General Comments        Pertinent Vitals/Pain Pain Assessment Pain Assessment: No/denies pain    Home Living  Prior Function            PT Goals (current goals can now be found in the care plan section) Acute Rehab PT Goals PT Goal Formulation: Patient unable to participate in goal setting    Frequency    Min 2X/week      PT Plan      Co-evaluation              AM-PAC PT "6 Clicks" Mobility   Outcome Measure  Help needed turning from your back to your side while in a flat bed without using bedrails?: A Lot Help needed moving from lying on your back to sitting on the side of a flat bed without using bedrails?: A Lot Help needed moving to and from a bed to a chair (including a wheelchair)?: A Lot Help needed standing up from a chair using your arms (e.g., wheelchair or bedside chair)?: A Lot Help needed to walk in hospital room?: A Little Help needed climbing 3-5 steps with a railing? : A Lot 6 Click Score: 13    End of Session    Activity Tolerance: Patient limited by fatigue Patient left: with call bell/phone within reach;with family/visitor present (seated EOB, DTR at bedside) Nurse Communication: Mobility status PT Visit Diagnosis: Muscle weakness (generalized) (M62.81);Difficulty in walking, not elsewhere classified (R26.2)     Time: 8295-6213 PT Time Calculation (min) (ACUTE ONLY): 26 min  Charges:    $Therapeutic Activity: 23-37 mins PT General Charges $$ ACUTE PT VISIT: 1 Visit                    12:02 PM, 10/08/23 Dawn Eth, PT, DPT Physical Therapist - [facility]  [phone] (ASCOM)    Alexis Farmer C 10/08/2023, 12:00 PM

## 2023-10-08 NOTE — Progress Notes (Signed)
 Progress Note   Patient: Alexis Farmer GEX:[id number] DOB: Oct 18, 1940 DOA: 09/29/2023     8 DOS: the patient was seen and examined on 10/08/2023   Brief hospital course:  "Alexis Farmer is a 83 y.o. female with medical history significant for monoclonal B-cell lymphoma, stage IIIb CKD, DM, HTN, HFpEF, chronic pain, obesity, immobility , hospitalized in February with a UTI, and in November with RSV who presents by EMS with a 2-week history of shortness of breath.  She states since her RSV infection her breathing never got quite right however over the past few days she has had worsening shortness of breath, with orthopnea and lower extremity edema. ED course and data review: SBP in the 200s HB at 11.6 and platelets 670. Creatinine slightly above baseline at 1.83 Glucose 221 EKG, personally viewed and interpreted showing sinus at 71 with LVH and no acute ST-T wave changes. Chest x-ray with findings suggestive of CHF Patient given IV Lasix  60 mg Hospitalist consulted for admission. "    Further hospital course and management as outlined below.    Assessment and Plan:  * Acute on chronic heart failure with preserved ejection fraction (HFpEF) (HCC) Hypertensive urgency Elevated troponin Myocardial injury Elevated troponin likely related to demand ischemia from CHF and elevated BP  Patient denies having any chest pain and shortness of breath has improved.   Weaned from 4 >> 2 L/min O2 >> room air --Cardiology, AHF team following, appreciate management --Initially on IV Lasix  BID dosing --Transitioned to Lasix  drip 4/17, currently at 20 mg/hr Net IO Since Admission: -8,268.96 mL [10/08/23 1359] (not accurate) --Diamox  500 mg bid + repeat 2.5 mg of metolazone  x 1 to augment diuresis  --Aggressive K replacment --Place UNNA boots --May need to place Foley for accurate Io's  --Imdur  stopped due to headaches --Wean off oxygen as tolerated --CHF GDMT & HTN meds as below --Strict Io's  and daily weights --Right heart cath planned once volume status optimized  Hypertensive Urgency -- BP's severely elevated with systolic 200 Uncontrolled and may be rebound since patient was on clonidine  but has not been resumed Hypotension -- 4/20 low BP's after escalation of meds past few days --Cardiology following --Clonidine  has been d/c'd today --On Lasix  drip as above, Diamox , Metolazone  --Coreg  reduced to 6.25 mg BID --Spironolactone  25 mg daily --Entresto  reduced to 49-51 mg BID --Hydralazine  resumed 50 mg TID  Uncontrolled type 2 diabetes mellitus with hyperglycemia, with long-term current with renal complications A1c 9.7% Patient on NPH and regular insulin  at home --Increase Semglee  15 >> 20 units --3 units Novolog  TID WC + moderate sliding scale --Carb modified diet   AKI superimposed on CKD stage 3b  Creatinine 1.88 up from baseline of 1.4 Monitor for worsening renal function in view of IV diuretic therapy --Hold spironolactone  and Avapro  --Monitor renal function closely   Obesity, Class III, BMI 40-49.9 (morbid obesity) (HCC) Complicating factor to overall prognosis and care Lifestyle modification and exercise has been discussed with patient in detail      Subjective: Pt awake resting in bed this AM. She reports feeling well overall. Asks me to call her granddaughter today.  Denies acute complaints.  Asks when cardiology is doing the test they mentioned to look at her heart.   Physical Exam: Vitals:   10/08/23 0559 10/08/23 0818 10/08/23 1110 10/08/23 1115  BP:  (!) 187/78 (!) 180/50 (!) 164/61  Pulse:  72 64 63  Resp:      Temp:  98.8 F (37.1  C) 98.1 F (36.7 C)   TempSrc:      SpO2:  94% 94%   Weight: 112.8 kg     Height:       General exam: awake & alert, no acute distress, obese HEENT: moist mucus membranes, hearing grossly normal  Respiratory system: CTAB, no wheezes, normal respiratory effort. Cardiovascular system: normal S1/S2, RRR, severe  2-3+ BLE pitting edema is slowly improving with notable skin wrinkling, facial and hand edema has significantly improved.   Gastrointestinal system: soft, NT, ND Central nervous system: A&O x3. no gross focal neurologic deficits, normal speech Psychiatry: normal mood, congruent affect, judgement and insight appear normal     Data Reviewed: Notable labs --   Cl 91 Glucose 202 BUN 27 Cr 1.73 >> 1.64 >> 1.67 >> 1.58 >> 1.74 >> 1.77 >> 1.90 >> 1.86 >> 1.99 Ca 8.7  Hbg stable 10.4 >> 10.1 >> 11.7 WBC 10.6 Platelets 644k     Family Communication: Daughter and granddaughter updated by phone this afternoon 4/22.   Disposition: Status is: Inpatient Remains inpatient appropriate because: Remains on Lasix  drip and ongoing evaluation with cardiology    Planned Discharge Destination: SNF recommended      Time spent: 42 minutes  Author: Montey Apa, DO 10/08/2023 1:59 PM  For on call review [email or web address].

## 2023-10-08 NOTE — TOC Progression Note (Signed)
 Transition of Care [facility]) - Progression Note    Patient Details  Name: Alexis Farmer MRN: [id number] Date of Birth: [birth year]  [facility] CM/SW Contact  Odilia Bennett, LCSW Phone Number: 10/08/2023, 5:05 PM  Clinical Narrative:   CSW went by room x 2 to talk to patient about SNF vs home health. Patient was on the [facility] the first time and was asleep the second time. Will try again later.  Expected Discharge Plan: Home w Home Health Services Barriers to Discharge: Continued Medical Work up  Expected Discharge Plan and Services       Living arrangements for the past 2 months: Single Family Home                           HH Arranged: PT, OT HH [facility] Date HH Agency Contacted: 10/02/23   Representative spoke with at [facility] Agency: Shaun   Social Determinants of Health (SDOH) Interventions SDOH Screenings   Food Insecurity: No Food Insecurity (10/03/2023)  Recent Concern: Food Insecurity - Food Insecurity Present (10/03/2023)  Housing: Low Risk  (10/03/2023)  Transportation Needs: No Transportation Needs (10/03/2023)  Recent Concern: Transportation Needs - Unmet Transportation Needs (09/23/2023)  Utilities: Not At Risk (09/30/2023)  Alcohol Screen: Low Risk  (11/29/2022)  Depression (PHQ2-9): High Risk (09/23/2023)  Financial Resource Strain: Medium Risk (10/03/2023)  Physical Activity: Inactive (10/03/2023)  Social Connections: Moderately Integrated (09/30/2023)  Stress: Stress Concern Present (11/29/2022)  Tobacco Use: Low Risk  (09/30/2023)    Readmission Risk Interventions    10/03/2023   12:03 PM  Readmission Risk Prevention Plan  Transportation Screening Complete  PCP or Specialist Appt within 3-5 Days Complete  HRI or Home Care Consult Complete  Social Work Consult for Recovery Care Planning/Counseling Complete  Palliative Care Screening Not Applicable  Medication Review Oceanographer) Complete

## 2023-10-08 NOTE — Progress Notes (Addendum)
 Advanced Heart Failure Rounding Note  PCP-Cardiologist: Belva Boyden, MD   Patient Profile   Malayna Geyer is a 83 y.o. female with a PMH of resistant hypertension, chronic HFpEF, obesity, type 2 diabetes, CKD stage IIIb who is seen today for evaluation of acute on chronic diastolic heart failure at the request of Dr Gollan.   Subjective:    3.8L in UOP yesterday w/ lasix  gtt at 20/hr + 500 bid of diamox  and 2.5 of metolazone .  SCr relatively stable at 1.99 K 3.8  Daily wts not reliable (bed wts). Remains volume overloaded but leg edema improving.   No resting dyspnea. C/w abdominal fullness, early satiety. Mild nausea but no vomiting. Complains of poor sleep/insomnia.    Labile BPs. SBPs back up after med dose reduction over the weekend, SBPs 140s-180s.    Objective:   Weight Range: 112.8 kg Body mass index is 44.05 kg/m.   Vital Signs:   Temp:  [98.4 F (36.9 C)-98.9 F (37.2 C)] 98.8 F (37.1 C) (04/22 0818) Pulse Rate:  [65-73] 72 (04/22 0818) Resp:  [18] 18 (04/22 0354) BP: (113-187)/(58-78) 187/78 (04/22 0818) SpO2:  [93 %-100 %] 94 % (04/22 0818) Weight:  [112.8 kg] 112.8 kg (04/22 0559) Last BM Date : 10/07/23  Weight change: Filed Weights   10/06/23 0556 10/07/23 0535 10/08/23 0559  Weight: 123.1 kg 124.2 kg 112.8 kg    Intake/Output:   Intake/Output Summary (Last 24 hours) at 10/08/2023 0854 Last data filed at 10/08/2023 0300 Gross per 24 hour  Intake 480 ml  Output 3750 ml  Net -3270 ml      Physical Exam   General:  elderly, fatigue appearing No respiratory difficulty HEENT: normal Neck: supple. JVD elevated to jaw. Carotids 2+ bilat; no bruits. No lymphadenopathy or thyromegaly appreciated. Cor: PMI nondisplaced. Regular rate & rhythm. No rubs, gallops or murmurs. Lungs: decreased BS at the bases  Abdomen: soft, nontender, +distended. No hepatosplenomegaly. No bruits or masses. Good bowel sounds. Extremities: no cyanosis, clubbing,  rash, 2+ b/l LEE pitting edema up to thighs  Neuro: alert & oriented x 3, cranial nerves grossly intact. moves all 4 extremities w/o difficulty. Affect pleasant.   Telemetry   NSR 70s, personally reviewed    Assessment/Plan   1. Acute on chronic diastolic heart failure:  - With biventricular involvement and pulmonary hypertension as outlined below, LVEF 60 to 65% - remains severely volume overloaded. Responding well to diuretics  - continue Lasix  gtt at 20 mg/hr + diamox  500 mg bid + 5 mg of metolazone  x 1.  - place UNNA boots, discussed personally w/ RN - Weights likely inaccurate (bed wts). Unable to stand  - Continue Entersto 49-51 mg bid (dose reduced due to hypotension)  - Continue Spironolactone  25 mg daily  - Continue Coreg  6.25 mg bid  - Restart hydralazine  50 tid  - fluid restrict 1800 ml/day  - aggressive K supp w/ diuresis. Repeat f/u BMP this PM  - will need RHC prior to d/c once optimized   2. Pulmonary hypertension:  - Estimated PA systolic pressure 80 mmHg on most recent echo - Interestingly RV function is fairly well-preserved and the RV is not dilated - Suspect primarily who group 2 with severely uncontrolled hypertension - Potentially a group 3 component as well - push diuresis per above.  - plan RHC prior to d/c    3. 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 Type 2 Diabetes:  - long standing. Poorly controlled, recent Hgb A1c 9.7  - on insulin  - management per IM    Length of Stay: [id number], PA-C  10/08/2023, 8:54 AM  Advanced Heart Failure Team Pager [phone] (M-F; 7a - 5p)  Please contact [facility] for night-coverage after hours (5p -7a ) and weekends on [email or web address]  Patient seen and examined with the above-signed Advanced Practice Provider and/or Housestaff. I personally reviewed  laboratory data, imaging studies and relevant notes. I independently examined the patient and formulated the important aspects of the plan. I have edited the note to reflect any of my changes or salient points. I have personally discussed the plan with the patient and/or family.  Diuresing well on IV lasix  at 20, metolazone  and diamox . Denies CP or SOB.   Remains volume overloaded. Scr stable at 1.9  General:  Elderly No resp difficulty HEENT: normal Neck: supple. JVP to ear  C Cor: Regular rate & rhythm. 2/6 TR Lungs: clear Abdomen: soft, nontender, nondistended. No hepatosplenomegaly. No bruits or masses. Good bowel sounds. Extremities: no cyanosis, clubbing, rash, 3+  edema Neuro: alert & orientedx3, cranial nerves grossly intact. moves all 4 extremities w/o difficulty. Affect pleasant  She remains markedly volume overloaded. Continue diuresis. Will increase metolazone . Can increase lasix  drop to 30/hr as needed.   Plan RHC after further diuresis.   Jules Oar, MD  1:06 PM

## 2023-10-08 NOTE — Inpatient Diabetes Management (Signed)
 Inpatient Diabetes Program Recommendations  AACE/ADA: New Consensus Statement on Inpatient Glycemic Control   Target Ranges:  Prepandial:   less than 140 mg/dL      Peak postprandial:   less than 180 mg/dL (1-2 hours)      Critically ill patients:  140 - 180 mg/dL    Latest Reference Range & Units 10/07/23 07:44 10/07/23 12:01 10/07/23 16:04 10/07/23 22:11 10/08/23 04:25 10/08/23 08:18  Glucose-Capillary 70 - 99 mg/dL 086 (H) 578 (H) 469 (H) 219 (H) 217 (H) 230 (H)   Review of Glycemic Control  Diabetes history: DM2 Outpatient Diabetes medications: NPH 20 units QAM, NPH 15 units QPM, Regular 0-8 units TID Current orders for Inpatient glycemic control: Semglee  15 units daily, Novolog  3 units TID with meals, Novolog  0-15 units TID with meals   Inpatient Diabetes Program Recommendations:    Insulin : Please consider increasing Semglee  to 18 units daily (to start 10/09/23). If agreeable, also order one time Semglee  3 units x1 now (for total of 18 units today).  Thanks, Beacher Limerick, RN, MSN, CDCES Diabetes Coordinator Inpatient Diabetes Program [phone] (Team Pager from 8am to 5pm)

## 2023-10-09 ENCOUNTER — Encounter: Admitting: Family

## 2023-10-09 DIAGNOSIS — I5033 Acute on chronic diastolic (congestive) heart failure: Secondary | ICD-10-CM | POA: Diagnosis not present

## 2023-10-09 LAB — GLUCOSE, CAPILLARY
Glucose-Capillary: 215 mg/dL — ABNORMAL HIGH (ref 70–99)
Glucose-Capillary: 213 mg/dL — ABNORMAL HIGH (ref 70–99)
Glucose-Capillary: 164 mg/dL — ABNORMAL HIGH (ref 70–99)
Glucose-Capillary: 190 mg/dL — ABNORMAL HIGH (ref 70–99)

## 2023-10-09 LAB — COMPREHENSIVE METABOLIC PANEL WITH GFR
ALT: 24 U/L (ref 0–44)
AST: 47 U/L — ABNORMAL HIGH (ref 15–41)
Albumin: 2.5 g/dL — ABNORMAL LOW (ref 3.5–5.0)
Alkaline Phosphatase: 45 U/L (ref 38–126)
Anion gap: 13 (ref 5–15)
BUN: 27 mg/dL — ABNORMAL HIGH (ref 8–23)
CO2: 31 mmol/L (ref 22–32)
Calcium: 8.9 mg/dL (ref 8.9–10.3)
Chloride: 88 mmol/L — ABNORMAL LOW (ref 98–111)
Creatinine, Ser: 2.2 mg/dL — ABNORMAL HIGH (ref 0.44–1.00)
GFR, Estimated: 22 mL/min — ABNORMAL LOW (ref >60)
Glucose, Bld: 164 mg/dL — ABNORMAL HIGH (ref 70–99)
Potassium: 3.5 mmol/L (ref 3.5–5.1)
Sodium: 132 mmol/L — ABNORMAL LOW (ref 135–145)
Total Bilirubin: 0.6 mg/dL (ref 0.0–1.2)
Total Protein: 6 g/dL — ABNORMAL LOW (ref 6.5–8.1)

## 2023-10-09 LAB — BASIC METABOLIC PANEL WITH GFR
Anion gap: 8 (ref 5–15)
BUN: 28 mg/dL — ABNORMAL HIGH (ref 8–23)
CO2: 34 mmol/L — ABNORMAL HIGH (ref 22–32)
Calcium: 8.6 mg/dL — ABNORMAL LOW (ref 8.9–10.3)
Chloride: 90 mmol/L — ABNORMAL LOW (ref 98–111)
Creatinine, Ser: 2.02 mg/dL — ABNORMAL HIGH (ref 0.44–1.00)
GFR, Estimated: 24 mL/min — ABNORMAL LOW (ref >60)
Glucose, Bld: 198 mg/dL — ABNORMAL HIGH (ref 70–99)
Potassium: 3.5 mmol/L (ref 3.5–5.1)
Sodium: 132 mmol/L — ABNORMAL LOW (ref 135–145)

## 2023-10-09 LAB — CBC
HCT: 36.4 % (ref 36.0–46.0)
Hemoglobin: 11.6 g/dL — ABNORMAL LOW (ref 12.0–15.0)
MCH: 28.3 pg (ref 26.0–34.0)
MCHC: 31.9 g/dL (ref 30.0–36.0)
MCV: 88.8 fL (ref 80.0–100.0)
Platelets: 623 10*3/uL — ABNORMAL HIGH (ref 150–400)
RBC: 4.1 MIL/uL (ref 3.87–5.11)
RDW: 14.2 % (ref 11.5–15.5)
WBC: 8.1 10*3/uL (ref 4.0–10.5)
nRBC: 0 % (ref 0.0–0.2)

## 2023-10-09 LAB — BRAIN NATRIURETIC PEPTIDE: B Natriuretic Peptide: 223.7 pg/mL — ABNORMAL HIGH (ref 0.0–100.0)

## 2023-10-09 LAB — MAGNESIUM: Magnesium: 1.7 mg/dL (ref 1.7–2.4)

## 2023-10-09 MED ORDER — POTASSIUM CHLORIDE CRYS ER 20 MEQ PO TBCR
40.0000 meq | EXTENDED_RELEASE_TABLET | Freq: Once | ORAL | Status: AC
  Administered 2023-10-09: 40 meq via ORAL
  Filled 2023-10-09: qty 2

## 2023-10-09 MED ORDER — MAGNESIUM SULFATE 4 GM/100ML IV SOLN
4.0000 g | Freq: Once | INTRAVENOUS | Status: AC
  Administered 2023-10-09: 4 g via INTRAVENOUS
  Filled 2023-10-09: qty 100

## 2023-10-09 MED ORDER — ACETAZOLAMIDE 250 MG PO TABS
500.0000 mg | ORAL_TABLET | Freq: Once | ORAL | Status: AC
  Administered 2023-10-09: 500 mg via ORAL
  Filled 2023-10-09: qty 2

## 2023-10-09 NOTE — TOC Progression Note (Signed)
 Transition of Care [facility]) - Progression Note    Patient Details  Name: Alexis Farmer MRN: [id number] Date of Birth: [birth year]  [facility]) CM/SW Contact  Baird Bombard, RN Phone Number: 10/09/2023, 9:09 AM  Clinical Narrative:    Spoke with patient at bedside regarding therapy's recommendation for SNF. She is agreeable to SNF. She does not have a preference for facilities. She does not wish to go to [facility].  Expected Discharge Plan: Home w Home Health Services Barriers to Discharge: Continued Medical Work up  Expected Discharge Plan and Services       Living arrangements for the past 2 months: Single Family Home                           HH Arranged: PT, OT [facility]: [facility] Date HH Agency Contacted: 10/02/23   Representative spoke with at [facility] Agency: Shaun   Social Determinants of Health (SDOH) Interventions SDOH Screenings   Food Insecurity: No Food Insecurity (10/03/2023)  Recent Concern: Food Insecurity - Food Insecurity Present (10/03/2023)  Housing: Low Risk  (10/03/2023)  Transportation Needs: No Transportation Needs (10/03/2023)  Recent Concern: Transportation Needs - Unmet Transportation Needs (09/23/2023)  Utilities: Not At Risk (09/30/2023)  Alcohol Screen: Low Risk  (11/29/2022)  Depression (PHQ2-9): High Risk (09/23/2023)  Financial Resource Strain: Medium Risk (10/03/2023)  Physical Activity: Inactive (10/03/2023)  Social Connections: Moderately Integrated (09/30/2023)  Stress: Stress Concern Present (11/29/2022)  Tobacco Use: Low Risk  (09/30/2023)    Readmission Risk Interventions    10/03/2023   12:03 PM  Readmission Risk Prevention Plan  Transportation Screening Complete  PCP or Specialist Appt within 3-5 Days Complete  HRI or Home Care Consult Complete  Social Work Consult for Recovery Care Planning/Counseling Complete  Palliative Care Screening Not Applicable  Medication Review Oceanographer)  Complete

## 2023-10-09 NOTE — Plan of Care (Signed)
  Problem: Education: Goal: Individualized Educational Video(s) Outcome: Progressing   Problem: Coping: Goal: Ability to adjust to condition or change in health will improve Outcome: Progressing   Problem: Fluid Volume: Goal: Ability to maintain a balanced intake and output will improve Outcome: Progressing   Problem: Health Behavior/Discharge Planning: Goal: Ability to identify and utilize available resources and services will improve Outcome: Progressing Goal: Ability to manage health-related needs will improve Outcome: Progressing   Problem: Metabolic: Goal: Ability to maintain appropriate glucose levels will improve Outcome: Progressing   Problem: Nutritional: Goal: Maintenance of adequate nutrition will improve Outcome: Progressing Goal: Progress toward achieving an optimal weight will improve Outcome: Progressing   Problem: Skin Integrity: Goal: Risk for impaired skin integrity will decrease Outcome: Progressing   Problem: Tissue Perfusion: Goal: Adequacy of tissue perfusion will improve Outcome: Progressing   Problem: Education: Goal: Knowledge of General Education information will improve Description: Including pain rating scale, medication(s)/side effects and non-pharmacologic comfort measures Outcome: Progressing   Problem: Health Behavior/Discharge Planning: Goal: Ability to manage health-related needs will improve Outcome: Progressing   Problem: Clinical Measurements: Goal: Ability to maintain clinical measurements within normal limits will improve Outcome: Progressing Goal: Will remain free from infection Outcome: Progressing Goal: Diagnostic test results will improve Outcome: Progressing Goal: Respiratory complications will improve Outcome: Progressing Goal: Cardiovascular complication will be avoided Outcome: Progressing   Problem: Activity: Goal: Risk for activity intolerance will decrease Outcome: Progressing   Problem: Nutrition: Goal:  Adequate nutrition will be maintained Outcome: Progressing   Problem: Coping: Goal: Level of anxiety will decrease Outcome: Progressing   Problem: Elimination: Goal: Will not experience complications related to bowel motility Outcome: Progressing Goal: Will not experience complications related to urinary retention Outcome: Progressing   Problem: Pain Managment: Goal: General experience of comfort will improve and/or be controlled Outcome: Progressing   Problem: Safety: Goal: Ability to remain free from injury will improve Outcome: Progressing   Problem: Skin Integrity: Goal: Risk for impaired skin integrity will decrease Outcome: Progressing   Problem: Education: Goal: Ability to demonstrate management of disease process will improve Outcome: Progressing Goal: Ability to verbalize understanding of medication therapies will improve Outcome: Progressing Goal: Individualized Educational Video(s) Outcome: Progressing   Problem: Activity: Goal: Capacity to carry out activities will improve Outcome: Progressing   Problem: Cardiac: Goal: Ability to achieve and maintain adequate cardiopulmonary perfusion will improve Outcome: Progressing

## 2023-10-09 NOTE — Progress Notes (Signed)
 Progress Note   Patient: Alexis Farmer JYN:[id number] DOB: 1940-07-08 DOA: 09/29/2023     9 DOS: the patient was seen and examined on 10/09/2023    Brief hospital course:   "Alexis Farmer is a 83 y.o. female with medical history significant for monoclonal B-cell lymphoma, stage IIIb CKD, DM, HTN, HFpEF, chronic pain, obesity, immobility , hospitalized in February with a UTI, and in November with RSV who presents by EMS with a 2-week history of shortness of breath.  She states since her RSV infection her breathing never got quite right however over the past few days she has had worsening shortness of breath, with orthopnea and lower extremity edema. ED course and data review: SBP in the 200s HB at 11.6 and platelets 670. Creatinine slightly above baseline at 1.83 Glucose 221 EKG, personally viewed and interpreted showing sinus at 71 with LVH and no acute ST-T wave changes. Chest x-ray with findings suggestive of CHF Patient given IV Lasix  60 mg Hospitalist consulted for admission. "     Further hospital course and management as outlined below.       Assessment and Plan:   * Acute on chronic heart failure with preserved ejection fraction (HFpEF) (HCC) Hypertensive urgency Elevated troponin Myocardial injury Elevated troponin likely related to demand ischemia from CHF and elevated BP  Patient denies having any chest pain and shortness of breath has improved.   Weaned from 4 >> 2 L/min O2 >> room air --Cardiology, AHF team following, appreciate management --Initially on IV Lasix  BID dosing --Transitioned to Lasix  drip 4/17, currently at 20 mg/hr Net IO Since Admission: -8,268.96 mL [10/08/23 1359] (not accurate) --Diamox  500 mg bid + repeat 2.5 mg of metolazone  x 1 to augment diuresis  --Aggressive K replacment --Place UNNA boots --May need to place Foley for accurate Io's  --Imdur  stopped due to headaches --Wean off oxygen as tolerated --CHF GDMT & HTN meds as  below --Strict Io's and daily weights --Right heart cath planned once volume status optimized   Hypertensive Urgency -- BP's severely elevated with systolic 200 Uncontrolled and may be rebound since patient was on clonidine  but has not been resumed Hypotension -- 4/20 low BP's after escalation of meds past few days --Cardiology following --On Lasix  drip as above, Diamox , Metolazone  Continue Coreg  and spironolactone  --Entresto  reduced to 49-51 mg BID --Hydralazine  resumed 50 mg TID   Uncontrolled type 2 diabetes mellitus with hyperglycemia, with long-term current with renal complications A1c 9.7% Patient on NPH and regular insulin  at home --Increase Semglee  15 >> 20 units --3 units Novolog  TID WC + moderate sliding scale --Carb modified diet   AKI superimposed on CKD stage 3b  Creatinine 1.88 up from baseline of 1.4 Monitor for worsening renal function in view of IV diuretic therapy --Hold spironolactone  and Avapro  Continue to monitor renal function   Obesity, Class III, BMI 40-49.9 (morbid obesity) (HCC) Complicating factor to overall prognosis and care Lifestyle modification and exercise has been discussed with patient in detail    Family Communication: Daughter and granddaughter updated by phone this afternoon 4/22.     Disposition: Status is: Inpatient Remains inpatient appropriate because: Remains on Lasix  drip and ongoing evaluation with cardiology     Planned Discharge Destination: SNF recommended    Time spent: 40 minutes   Subjective: Pt awake resting in bed this AM. She reports feeling well overall. Asks me to call her granddaughter today.  Denies acute complaints.  Asks when cardiology is doing the test they mentioned  to look at her heart.     Physical Exam:  General exam: awake & alert, no acute distress, obese HEENT: moist mucus membranes, hearing grossly normal  Respiratory system: CTAB, no wheezes, normal respiratory effort. Cardiovascular system:  normal S1/S2, RRR, severe 2-3+ BLE pitting edema is slowly improving with notable skin wrinkling, facial and hand edema has significantly improved.   Gastrointestinal system: soft, NT, ND Central nervous system: A&O x3. no gross focal neurologic deficits, normal speech Psychiatry: normal mood, congruent affect, judgement and insight appear normal     Data Reviewed:    Latest Ref Rng & Units 10/09/2023    6:13 AM 10/08/2023    4:47 AM 10/03/2023    4:15 AM  CBC  WBC 4.0 - 10.5 K/uL 8.1  10.6  6.8   Hemoglobin 12.0 - 15.0 g/dL 13.0  86.5  78.4   Hematocrit 36.0 - 46.0 % 36.4  35.5  31.6   Platelets 150 - 400 K/uL 623  644  532        Latest Ref Rng & Units 10/09/2023    1:17 PM 10/09/2023    6:13 AM 10/08/2023    4:47 AM  BMP  Glucose 70 - 99 mg/dL 696  295  284   BUN 8 - 23 mg/dL 27  28  27    Creatinine 0.44 - 1.00 mg/dL 1.32  4.40  1.02   Sodium 135 - 145 mmol/L 132  132  135   Potassium 3.5 - 5.1 mmol/L 3.5  3.5  3.8   Chloride 98 - 111 mmol/L 88  90  91   CO2 22 - 32 mmol/L 31  34  32   Calcium  8.9 - 10.3 mg/dL 8.9  8.6  8.7         Vitals:   10/09/23 0745 10/09/23 1343 10/09/23 1410 10/09/23 1728  BP: (!) 129/59 (!) 108/35 (!) 120/38 (!) 156/57  Pulse: 74 61  69  Resp: 19 16    Temp: 98.7 F (37.1 C) 98.1 F (36.7 C)  98.2 F (36.8 C)  TempSrc: Oral   Oral  SpO2: 94% 100% 100% 95%  Weight:      Height:         Author: Ezzard Holms, MD 10/09/2023 6:16 PM  For on call review [email or web address].

## 2023-10-09 NOTE — TOC Progression Note (Signed)
 Transition of Care [facility]) - Progression Note    Patient Details  Name: Alexis Farmer MRN: [id number] Date of Birth: April 26, 1941  Transition of Care [facility]) CM/SW Contact  Baird Bombard, RN Phone Number: 10/09/2023, 9:13 AM  Clinical Narrative:    Bed search started.    Expected Discharge Plan: Home w Home Health Services Barriers to Discharge: Continued Medical Work up  Expected Discharge Plan and Services       Living arrangements for the past 2 months: Single Family Home                           HH Arranged: PT, OT HH [facility]: [facility] Date HH Agency Contacted: 10/02/23   Representative spoke with at [facility] Agency: Shaun   Social Determinants of Health (SDOH) Interventions SDOH Screenings   Food Insecurity: No Food Insecurity (10/03/2023)  Recent Concern: Food Insecurity - Food Insecurity Present (10/03/2023)  Housing: Low Risk  (10/03/2023)  Transportation Needs: No Transportation Needs (10/03/2023)  Recent Concern: Transportation Needs - Unmet Transportation Needs (09/23/2023)  Utilities: Not At Risk (09/30/2023)  Alcohol Screen: Low Risk  (11/29/2022)  Depression (PHQ2-9): High Risk (09/23/2023)  Financial Resource Strain: Medium Risk (10/03/2023)  Physical Activity: Inactive (10/03/2023)  Social Connections: Moderately Integrated (09/30/2023)  Stress: Stress Concern Present (11/29/2022)  Tobacco Use: Low Risk  (09/30/2023)    Readmission Risk Interventions    10/03/2023   12:03 PM  Readmission Risk Prevention Plan  Transportation Screening Complete  PCP or Specialist Appt within 3-5 Days Complete  HRI or Home Care Consult Complete  Social Work Consult for Recovery Care Planning/Counseling Complete  Palliative Care Screening Not Applicable  Medication Review Oceanographer) Complete

## 2023-10-09 NOTE — Inpatient Diabetes Management (Addendum)
 Inpatient Diabetes Program Recommendations  AACE/ADA: New Consensus Statement on Inpatient Glycemic Control   Target Ranges:  Prepandial:   less than 140 mg/dL      Peak postprandial:   less than 180 mg/dL (1-2 hours)      Critically ill patients:  140 - 180 mg/dL    Latest Reference Range & Units 10/08/23 08:18 10/08/23 11:08 10/08/23 16:09 10/08/23 22:25 10/09/23 07:45  Glucose-Capillary 70 - 99 mg/dL 914 (H)  Novolog  8 units  Semglee  15 units 217 (H)  Novolog  8 units 70 175 (H) 215 (H)  Novolog  6 units  Semglee  15 units  (H): Data is abnormally high  Review of Glycemic Control  Diabetes history: DM2 Outpatient Diabetes medications: NPH 20 units QAM, NPH 15 units QPM, Regular 0-8 units TID Current orders for Inpatient glycemic control: Semglee  15 units daily, Novolog  3 units TID with meals, Novolog  0-15 units TID with meals   Inpatient Diabetes Program Recommendations:     Insulin : Please consider increasing Semglee  to 17 units daily (to start 10/10/23). If agreeable, also order one time Semglee  2 units x1 now (for total of 17 units today).  Also, please consider decreasing Novolog  correction to 0-9 units TID with meals and adding Novolog  0-5 units at bedtime.  Thanks, Beacher Limerick, RN, MSN, CDCES Diabetes Coordinator Inpatient Diabetes Program [phone] (Team Pager from 8am to 5pm)

## 2023-10-09 NOTE — Progress Notes (Addendum)
 Advanced Heart Failure Rounding Note  PCP-Cardiologist: Belva Boyden, MD   Patient Profile   Alexis Farmer is a 83 y.o. female with a PMH of resistant hypertension, chronic HFpEF, obesity, type 2 diabetes, CKD stage IIIb who is seen today for evaluation of acute on chronic diastolic heart failure at the request of Dr Gollan.   Subjective:    3.6L yesterday on lasix  gtt at 30/hr + 500 diamox  BID + Metolazone  5 mg daily. C/w clear/pale yellow urine production. LEE improving but still w/ volume overload.   Daily wts not reliable. Denies resting dyspnea.   Scr 1.99>>2.02 Cl 90 CO2 34   K 3.5 Mg 1.7  Feels depressed. No SI/HI.   Objective:   Weight Range: 112.8 kg Body mass index is 44.05 kg/m.   Vital Signs:   Temp:  [98.1 F (36.7 C)-98.8 F (37.1 C)] 98.7 F (37.1 C) (04/23 0745) Pulse Rate:  [56-74] 74 (04/23 0745) Resp:  [17-19] 19 (04/23 0745) BP: (113-187)/(46-78) 129/59 (04/23 0745) SpO2:  [91 %-95 %] 94 % (04/23 0745) Last BM Date : 10/07/23  Weight change: Filed Weights   10/06/23 0556 10/07/23 0535 10/08/23 0559  Weight: 123.1 kg 124.2 kg 112.8 kg    Intake/Output:   Intake/Output Summary (Last 24 hours) at 10/09/2023 0750 Last data filed at 10/09/2023 0444 Gross per 24 hour  Intake 360 ml  Output 3550 ml  Net -3190 ml      Physical Exam   General:  fatigued appearing. No respiratory difficulty HEENT: normal Neck: supple. JVD 12 cm. Carotids 2+ bilat; no bruits. No lymphadenopathy or thyromegaly appreciated. Cor: PMI nondisplaced. Regular rate & rhythm. No rubs, gallops or murmurs. Lungs: decreased BS at the bases  Abdomen: soft, nontender, nondistended. No hepatosplenomegaly. No bruits or masses. Good bowel sounds. Extremities: no cyanosis, clubbing, rash, 1+ b/l LEE  Neuro: alert & oriented x 3, cranial nerves grossly intact. moves all 4 extremities w/o difficulty. Affect pleasant.    Telemetry    NSR 70s, personally reviewed    Assessment/Plan   1. Acute on chronic diastolic heart failure:  - With biventricular involvement and pulmonary hypertension as outlined below, LVEF 60 to 65% - improving w/ diuresis but c/w volume overload on exam  - continue lasix  gtt at 30 hr + repeat Diamox  500 mg x 1 this morning. Hold Metolazone  today. Check CMP/BNP at 1300  - place TED hoses  - Weights likely inaccurate (bed wts). Unable to stand  - Continue Entersto 49-51 mg bid (dose reduced due to hypotension)  - Continue Spironolactone  25 mg daily  - Continue Coreg  6.25 mg bid  - Continue hydralazine  50 tid  - fluid restrict 1800 ml/day  - will need RHC prior to d/c once optimized (anticipate tomorrow)    2. Pulmonary hypertension:  - Estimated PA systolic pressure 80 mmHg on most recent echo - Interestingly RV function is fairly well-preserved and the RV is not dilated - Suspect primarily who group 2 with severely uncontrolled hypertension - Potentially a group 3 component as well - continue IV diuretics per above  - plan RHC prior to d/c    3. 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 Obtain CMP this afternoon    4. Type 2 Diabetes:  - long standing. Poorly controlled, recent Hgb A1c 9.7  -  on insulin  - management per IM   5. Hypokalemia/Hypomagnesemia - K 3.5, Mg 1.7 - give KCl + IV Mg supp  - monitor w/ diuresis    Length of Stay: [address] PA-C  10/09/2023, 7:50 AM  Advanced Heart Failure Team Pager [phone] (M-F; 7a - 5p)  Please contact [facility] for night-coverage after hours (5p -7a ) and weekends on [email or web address]  Patient seen with PA, I formulated the plan and agree with the above note.   She has not felt good today, legs are tender and she has been short of breath.  She has diuresed well on Lasix  gtt 30 mg/hr.  I/Os net negative 3190 yesterday,  weight trending down.  This afternoon, creatinine higher at 2.2 but BUN stable.    General: NAD Neck: JVP 10 cm, no thyromegaly or thyroid  nodule.  Lungs: Clear to auscultation bilaterally with normal respiratory effort. CV: Nondisplaced PMI.  Heart regular S1/S2, no S3/S4, 2/6 SEM RUSB.  1+ edema to knees.   Abdomen: Soft, nontender, no hepatosplenomegaly, no distention.  Skin: Intact without lesions or rashes.  Neurologic: Alert and oriented x 3.  Psych: Normal affect. Extremities: No clubbing or cyanosis.  HEENT: Normal.   HFpEF with marked volume overload and cardiorenal syndrome. BUN stable, creatinine trending slowly up.  - Would continue Lasix  30 mg/hr today and give acetazolamide  500 mg IV x 1.  HCO3 31.   - Stop Entresto  with lower BP and rising creatinine.  - Should get eventual workup for cardiac amyloidosis.  I do not think she could tolerate cardiac MRI currently, and radiotracer is on back order for PYP scan so this will need to be as outpatient.  - Arrange for RHC to assess filling pressures and PA pressure in setting of rising creatinine.  I discussed risks/benefits with patient and she agrees with procedure.   [name] 10/09/2023 4:54 PM

## 2023-10-10 ENCOUNTER — Encounter
Admission: EM | Disposition: A | Discharge status: Skilled Nursing Facility | Payer: Self-pay | Source: Skilled Nursing Facility | Attending: Internal Medicine

## 2023-10-10 ENCOUNTER — Telehealth: Payer: Self-pay | Admitting: *Deleted

## 2023-10-10 ENCOUNTER — Telehealth: Payer: Self-pay | Admitting: Internal Medicine

## 2023-10-10 DIAGNOSIS — I5033 Acute on chronic diastolic (congestive) heart failure: Secondary | ICD-10-CM | POA: Diagnosis not present

## 2023-10-10 DIAGNOSIS — I5032 Chronic diastolic (congestive) heart failure: Secondary | ICD-10-CM

## 2023-10-10 HISTORY — PX: RIGHT HEART CATH: CATH118263

## 2023-10-10 LAB — CBC WITH DIFFERENTIAL/PLATELET
Abs Immature Granulocytes: 0.03 10*3/uL (ref 0.00–0.07)
Basophils Absolute: 0.1 10*3/uL (ref 0.0–0.1)
Basophils Relative: 1 %
Eosinophils Absolute: 0.1 10*3/uL (ref 0.0–0.5)
Eosinophils Relative: 1 %
HCT: 35.2 % — ABNORMAL LOW (ref 36.0–46.0)
Hemoglobin: 11.4 g/dL — ABNORMAL LOW (ref 12.0–15.0)
Immature Granulocytes: 0 %
Lymphocytes Relative: 45 %
Lymphs Abs: 4.3 10*3/uL — ABNORMAL HIGH (ref 0.7–4.0)
MCH: 28.9 pg (ref 26.0–34.0)
MCHC: 32.4 g/dL (ref 30.0–36.0)
MCV: 89.1 fL (ref 80.0–100.0)
Monocytes Absolute: 1.1 10*3/uL — ABNORMAL HIGH (ref 0.1–1.0)
Monocytes Relative: 12 %
Neutro Abs: 3.8 10*3/uL (ref 1.7–7.7)
Neutrophils Relative %: 41 %
Platelets: 650 10*3/uL — ABNORMAL HIGH (ref 150–400)
RBC: 3.95 MIL/uL (ref 3.87–5.11)
RDW: 14.2 % (ref 11.5–15.5)
WBC: 9.3 10*3/uL (ref 4.0–10.5)
nRBC: 0 % (ref 0.0–0.2)

## 2023-10-10 LAB — BASIC METABOLIC PANEL WITH GFR
Anion gap: 12 (ref 5–15)
BUN: 30 mg/dL — ABNORMAL HIGH (ref 8–23)
CO2: 33 mmol/L — ABNORMAL HIGH (ref 22–32)
Calcium: 8.7 mg/dL — ABNORMAL LOW (ref 8.9–10.3)
Chloride: 87 mmol/L — ABNORMAL LOW (ref 98–111)
Creatinine, Ser: 2.21 mg/dL — ABNORMAL HIGH (ref 0.44–1.00)
GFR, Estimated: 21 mL/min — ABNORMAL LOW (ref >60)
Glucose, Bld: 228 mg/dL — ABNORMAL HIGH (ref 70–99)
Potassium: 3.9 mmol/L (ref 3.5–5.1)
Sodium: 132 mmol/L — ABNORMAL LOW (ref 135–145)

## 2023-10-10 LAB — POCT I-STAT EG7
Acid-Base Excess: 11 mmol/L — ABNORMAL HIGH (ref 0.0–2.0)
Acid-Base Excess: 11 mmol/L — ABNORMAL HIGH (ref 0.0–2.0)
Bicarbonate: 36.8 mmol/L — ABNORMAL HIGH (ref 20.0–28.0)
Bicarbonate: 36.8 mmol/L — ABNORMAL HIGH (ref 20.0–28.0)
Calcium, Ion: 1.16 mmol/L (ref 1.15–1.40)
Calcium, Ion: 1.18 mmol/L (ref 1.15–1.40)
HCT: 39 % (ref 36.0–46.0)
HCT: 39 % (ref 36.0–46.0)
Hemoglobin: 13.3 g/dL (ref 12.0–15.0)
Hemoglobin: 13.3 g/dL (ref 12.0–15.0)
O2 Saturation: 50 %
O2 Saturation: 55 %
Potassium: 3.4 mmol/L — ABNORMAL LOW (ref 3.5–5.1)
Potassium: 3.5 mmol/L (ref 3.5–5.1)
Sodium: 133 mmol/L — ABNORMAL LOW (ref 135–145)
Sodium: 134 mmol/L — ABNORMAL LOW (ref 135–145)
TCO2: 38 mmol/L — ABNORMAL HIGH (ref 22–32)
TCO2: 38 mmol/L — ABNORMAL HIGH (ref 22–32)
pCO2, Ven: 51.5 mmHg (ref 44–60)
pCO2, Ven: 52.8 mmHg (ref 44–60)
pH, Ven: 7.451 — ABNORMAL HIGH (ref 7.25–7.43)
pH, Ven: 7.462 — ABNORMAL HIGH (ref 7.25–7.43)
pO2, Ven: 26 mmHg — CL (ref 32–45)
pO2, Ven: 28 mmHg — CL (ref 32–45)

## 2023-10-10 LAB — GLUCOSE, CAPILLARY
Glucose-Capillary: 263 mg/dL — ABNORMAL HIGH (ref 70–99)
Glucose-Capillary: 242 mg/dL — ABNORMAL HIGH (ref 70–99)
Glucose-Capillary: 189 mg/dL — ABNORMAL HIGH (ref 70–99)
Glucose-Capillary: 156 mg/dL — ABNORMAL HIGH (ref 70–99)
Glucose-Capillary: 145 mg/dL — ABNORMAL HIGH (ref 70–99)
Glucose-Capillary: 272 mg/dL — ABNORMAL HIGH (ref 70–99)

## 2023-10-10 SURGERY — RIGHT HEART CATH
Anesthesia: Moderate Sedation | Laterality: Bilateral

## 2023-10-10 MED ORDER — SODIUM CHLORIDE 0.9 % IV SOLN: INTRAVENOUS | Status: DC

## 2023-10-10 MED ORDER — LACTATED RINGERS IV BOLUS
500.0000 mL | Freq: Once | INTRAVENOUS | Status: AC
  Administered 2023-10-10: 500 mL via INTRAVENOUS

## 2023-10-10 MED ORDER — HEPARIN SODIUM (PORCINE) 1000 UNIT/ML IJ SOLN
INTRAMUSCULAR | Status: AC
  Filled 2023-10-10: qty 10

## 2023-10-10 MED ORDER — HEPARIN (PORCINE) IN NACL 1000-0.9 UT/500ML-% IV SOLN
INTRAVENOUS | Status: AC
  Filled 2023-10-10: qty 1000

## 2023-10-10 MED ORDER — LIDOCAINE HCL 1 % IJ SOLN
INTRAMUSCULAR | Status: AC
  Filled 2023-10-10: qty 20

## 2023-10-10 MED ORDER — MIDAZOLAM HCL 2 MG/2ML IJ SOLN
INTRAMUSCULAR | Status: AC
  Filled 2023-10-10: qty 2

## 2023-10-10 MED ORDER — HYDRALAZINE HCL 20 MG/ML IJ SOLN
10.0000 mg | INTRAMUSCULAR | Status: AC | PRN
  Filled 2023-10-10: qty 1

## 2023-10-10 MED ORDER — INSULIN GLARGINE-YFGN 100 UNIT/ML ~~LOC~~ SOLN
20.0000 [IU] | Freq: Every day | SUBCUTANEOUS | Status: DC
  Administered 2023-10-11 – 2023-10-13 (×3): 20 [IU] via SUBCUTANEOUS
  Filled 2023-10-10 (×5): qty 0.2

## 2023-10-10 MED ORDER — SODIUM CHLORIDE 0.9 % IV SOLN: 250.0000 mL | INTRAVENOUS | Status: AC | PRN

## 2023-10-10 MED ORDER — VERAPAMIL HCL 2.5 MG/ML IV SOLN
INTRAVENOUS | Status: AC
  Filled 2023-10-10: qty 2

## 2023-10-10 MED ORDER — SODIUM CHLORIDE 0.9% FLUSH
3.0000 mL | Freq: Two times a day (BID) | INTRAVENOUS | Status: DC
  Administered 2023-10-10 – 2023-10-15 (×10): 3 mL via INTRAVENOUS

## 2023-10-10 MED ORDER — NYSTATIN 100000 UNIT/GM EX CREA
TOPICAL_CREAM | Freq: Two times a day (BID) | CUTANEOUS | Status: DC
Start: 2023-10-10 — End: 2023-10-15
  Filled 2023-10-10: qty 30

## 2023-10-10 MED ORDER — ENOXAPARIN SODIUM 30 MG/0.3ML IJ SOSY: 30.0000 mg | PREFILLED_SYRINGE | INTRAMUSCULAR | Status: DC

## 2023-10-10 MED ORDER — ACETAMINOPHEN 325 MG PO TABS: 650.0000 mg | ORAL_TABLET | ORAL | Status: DC | PRN

## 2023-10-10 MED ORDER — FENTANYL CITRATE (PF) 100 MCG/2ML IJ SOLN
INTRAMUSCULAR | Status: AC
  Filled 2023-10-10: qty 2

## 2023-10-10 MED ORDER — HEPARIN (PORCINE) IN NACL 2000-0.9 UNIT/L-% IV SOLN
INTRAVENOUS | Status: DC | PRN
  Administered 2023-10-10: 1000 mL

## 2023-10-10 MED ORDER — LIDOCAINE HCL (PF) 1 % IJ SOLN
INTRAMUSCULAR | Status: DC | PRN
  Administered 2023-10-10: 2 mL

## 2023-10-10 MED ORDER — ORAL CARE MOUTH RINSE: 15.0000 mL | OROMUCOSAL | Status: DC | PRN

## 2023-10-10 MED ORDER — ONDANSETRON HCL 4 MG/2ML IJ SOLN: 4.0000 mg | Freq: Four times a day (QID) | INTRAMUSCULAR | Status: DC | PRN

## 2023-10-10 MED ORDER — SODIUM CHLORIDE 0.9% FLUSH: 3.0000 mL | INTRAVENOUS | Status: DC | PRN

## 2023-10-10 SURGICAL SUPPLY — 9 items
CATH SWAN GANZ 7F STRAIGHT (CATHETERS) IMPLANT
DRAPE BRACHIAL (DRAPES) IMPLANT
GLIDESHEATH SLENDER 7FR .021G (SHEATH) IMPLANT
GUIDEWIRE .025 260CM (WIRE) IMPLANT
GUIDEWIRE SUPER STIFF .035X180 (WIRE) IMPLANT
PACK CARDIAC CATH (CUSTOM PROCEDURE TRAY) ×1 IMPLANT
SET ATX-X65L (MISCELLANEOUS) IMPLANT
SHEATH GLIDE SLENDER 4/5FR (SHEATH) IMPLANT
STATION PROTECTION PRESSURIZED (MISCELLANEOUS) IMPLANT

## 2023-10-10 NOTE — Plan of Care (Signed)
  Problem: Education: Goal: Individualized Educational Video(s) Outcome: Progressing   Problem: Coping: Goal: Ability to adjust to condition or change in health will improve Outcome: Progressing   Problem: Fluid Volume: Goal: Ability to maintain a balanced intake and output will improve Outcome: Progressing   Problem: Health Behavior/Discharge Planning: Goal: Ability to identify and utilize available resources and services will improve Outcome: Progressing Goal: Ability to manage health-related needs will improve Outcome: Progressing

## 2023-10-10 NOTE — Inpatient Diabetes Management (Signed)
 Inpatient Diabetes Program Recommendations  AACE/ADA: New Consensus Statement on Inpatient Glycemic Control (2015)  Target Ranges:  Prepandial:   less than 140 mg/dL      Peak postprandial:   less than 180 mg/dL (1-2 hours)      Critically ill patients:  140 - 180 mg/dL   Lab Results  Component Value Date   GLUCAP 242 (H) 10/10/2023   HGBA1C 9.7 08/01/2023    Review of Glycemic Control  Latest Reference Range & Units 10/09/23 07:45 10/09/23 11:59 10/09/23 18:36 10/09/23 20:34 10/10/23 07:43 10/10/23 10:52  Glucose-Capillary 70 - 99 mg/dL 865 (H) 784 (H) 696 (H) 190 (H) 263 (H) 242 (H)  (H): Data is abnormally high Diabetes history: DM2 Outpatient Diabetes medications: NPH 20 units QAM, NPH 15 units QPM, Regular 0-8 units TID Current orders for Inpatient glycemic control: Semglee  15 units daily, Novolog  3 units TID with meals, Novolog  0-15 units TID with meals   Inpatient Diabetes Program Recommendations:    Consider increasing Semglee  to 20 units daily.   Thanks,  Josefa Ni, RN, BC-ADM Inpatient Diabetes Coordinator Pager [phone]  (8a-5p)

## 2023-10-10 NOTE — TOC Progression Note (Signed)
 Transition of Care [facility]) - Progression Note    Patient Details  Name: Alexis Farmer MRN: [id number] Date of Birth: May 09, 1941  [facility] CM/SW Contact  Baird Bombard, RN Phone Number: 10/10/2023, 3:36 PM  Clinical Narrative:    Spoke with patient at bedside to give bed offers for [facility] and [facility]. Patient stated she wanted to go home. She stated she had HH before being admitted but they werent helpful. Patient requested to contact her granddaughter.    Expected Discharge Plan: Home w Home Health Services Barriers to Discharge: Continued Medical Work up  Expected Discharge Plan and Services       Living arrangements for the past 2 months: Single Family Home                           HH Arranged: PT, OT HH Agency: [facility] Date HH Agency Contacted: 10/02/23   Representative spoke with at [facility] Agency: Shaun   Social Determinants of Health (SDOH) Interventions SDOH Screenings   Food Insecurity: No Food Insecurity (10/03/2023)  Recent Concern: Food Insecurity - Food Insecurity Present (10/03/2023)  Housing: Low Risk  (10/03/2023)  Transportation Needs: No Transportation Needs (10/03/2023)  Recent Concern: Transportation Needs - Unmet Transportation Needs (09/23/2023)  Utilities: Not At Risk (09/30/2023)  Alcohol Screen: Low Risk  (11/29/2022)  Depression (PHQ2-9): High Risk (09/23/2023)  Financial Resource Strain: Medium Risk (10/03/2023)  Physical Activity: Inactive (10/03/2023)  Social Connections: Moderately Integrated (09/30/2023)  Stress: Stress Concern Present (11/29/2022)  Tobacco Use: Low Risk  (09/30/2023)    Readmission Risk Interventions    10/03/2023   12:03 PM  Readmission Risk Prevention Plan  Transportation Screening Complete  PCP or Specialist Appt within 3-5 Days Complete  HRI or Home Care Consult Complete  Social Work Consult for Recovery Care Planning/Counseling Complete  Palliative  Care Screening Not Applicable  Medication Review Oceanographer) Complete

## 2023-10-10 NOTE — Interval H&P Note (Signed)
 History and Physical Interval Note:  10/10/2023 1:29 PM  Alexis Farmer  has presented today for surgery, with the diagnosis of diastolic heart failure, pulmonary hypertension.  The various methods of treatment have been discussed with the patient and family. After consideration of risks, benefits and other options for treatment, the patient has consented to  Procedure(s): RIGHT HEART CATH (Bilateral) as a surgical intervention.  The patient's history has been reviewed, patient examined, no change in status, stable for surgery.  I have reviewed the patient's chart and labs.  Questions were answered to the patient's satisfaction.     Vasilisa Vore

## 2023-10-10 NOTE — Progress Notes (Signed)
 Advanced Heart Failure Rounding Note  PCP-Cardiologist: Belva Boyden, MD   Patient Profile   Alexis Farmer is a 83 y.o. female with a PMH of resistant hypertension, chronic HFpEF, obesity, type 2 diabetes, CKD stage IIIb who is seen today for evaluation of acute on chronic diastolic heart failure at the request of Dr Gollan.   Subjective:      Lasix  gtt stopped. Got diamox  500 po x 1 yesterday as well.   Only 1400cc of urine output charted yesterday however weight down 10 poiounds overnight and 35 pounds 9??) in past 2 days.   Scr stable from yesterday but up slightly overall.   Denies CP or SOB. Legs sore  Pending RHC today    Objective:   Weight Range: 108.4 kg Body mass index is 42.33 kg/m.   Vital Signs:   Temp:  [98.1 F (36.7 C)-99.8 F (37.7 C)] 98.4 F (36.9 C) (04/24 0743) Pulse Rate:  [61-70] 70 (04/24 0743) Resp:  [16-18] 18 (04/24 0327) BP: (97-173)/(35-61) 173/50 (04/24 0743) SpO2:  [95 %-100 %] 98 % (04/24 0743) Weight:  [108.4 kg] 108.4 kg (04/24 0500) Last BM Date : 10/07/23  Weight change: Filed Weights   10/07/23 0535 10/08/23 0559 10/10/23 0500  Weight: 124.2 kg 112.8 kg 108.4 kg    Intake/Output:   Intake/Output Summary (Last 24 hours) at 10/10/2023 1120 Last data filed at 10/10/2023 0910 Gross per 24 hour  Intake 944.15 ml  Output 1050 ml  Net -105.85 ml      Physical Exam   General:  Lying in bed. No resp difficulty HEENT: normal Neck: supple. no JVD. Carotids 2+ bilat; no bruits. No lymphadenopathy or thryomegaly appreciated. Cor: PMI nondisplaced. Regular rate & rhythm. No rubs, gallops or murmurs. Lungs: clear Abdomen: obese soft, nontender, nondistended. No hepatosplenomegaly. No bruits or masses. Good bowel sounds. Extremities: no cyanosis, clubbing, rash, edema Neuro: alert & orientedx3, cranial nerves grossly intact. moves all 4 extremities w/o difficulty. Affect pleasant  Telemetry   Sinus 70s Personally  reviewed  Assessment/Plan   1. Acute on chronic diastolic heart failure:  - With biventricular involvement and pulmonary hypertension as outlined below, LVEF 60 to 65% - improving w/ diuresis but c/w volume overload on exam  - off lasix  gtt. Weight way down - Weights likely inaccurate (bed wts). Unable to stand  - Continue Entersto 49-51 mg bid (dose reduced due to hypotension)  - Continue Spironolactone  25 mg daily  - Continue Coreg  6.25 mg bid  - Continue hydralazine  50 tid  - Volume much improved. Agree with stopping lasix .  - Plan RHC today to objectively assess volume status   2. Pulmonary hypertension:  - Estimated PA systolic pressure 80 mmHg on most recent echo - Interestingly RV function is fairly well-preserved and the RV is not dilated - Suspect primarily who group 2 with severely uncontrolled hypertension - Potentially a group 3 component as well - RHC today  3. 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 Type 2 Diabetes:  - long standing. Poorly controlled, recent Hgb A1c 9.7  - on insulin  - management per IM   5. Hypokalemia/Hypomagnesemia - supp as needed   Length of Stay: 10  Jules Oar, MD  10/10/2023, 11:20 AM  Advanced Heart Failure Team Pager [phone] (M-F; 7a - 5p)  Please contact [facility] for night-coverage  after hours (5p -7a ) and weekends on [email or web address]

## 2023-10-10 NOTE — Progress Notes (Signed)
 PT Cancellation Note  Patient Details Name: Alexis Farmer MRN: [id number] DOB: 1940-11-07   Cancelled Treatment:    Reason Eval/Treat Not Completed: Patient at procedure or test/unavailable (Chart reviewed for planned treatment session.  Per notes, patient off unit for R heart cath.  Will continue to follow and resume care post-procedure as medically appropriate.)  Masayo Fera H. Bevin Bucks, PT, DPT, NCS 10/10/23, 2:15 PM [phone]

## 2023-10-10 NOTE — H&P (View-Only) (Signed)
 Advanced Heart Failure Rounding Note  PCP-Cardiologist: Belva Boyden, MD   Patient Profile   Alexis Farmer is a 83 y.o. female with a PMH of resistant hypertension, chronic HFpEF, obesity, type 2 diabetes, CKD stage IIIb who is seen today for evaluation of acute on chronic diastolic heart failure at the request of Dr Gollan.   Subjective:      Lasix  gtt stopped. Got diamox  500 po x 1 yesterday as well.   Only 1400cc of urine output charted yesterday however weight down 10 poiounds overnight and 35 pounds 9??) in past 2 days.   Scr stable from yesterday but up slightly overall.   Denies CP or SOB. Legs sore  Pending RHC today    Objective:   Weight Range: 108.4 kg Body mass index is 42.33 kg/m.   Vital Signs:   Temp:  [98.1 F (36.7 C)-99.8 F (37.7 C)] 98.4 F (36.9 C) (04/24 0743) Pulse Rate:  [61-70] 70 (04/24 0743) Resp:  [16-18] 18 (04/24 0327) BP: (97-173)/(35-61) 173/50 (04/24 0743) SpO2:  [95 %-100 %] 98 % (04/24 0743) Weight:  [108.4 kg] 108.4 kg (04/24 0500) Last BM Date : 10/07/23  Weight change: Filed Weights   10/07/23 0535 10/08/23 0559 10/10/23 0500  Weight: 124.2 kg 112.8 kg 108.4 kg    Intake/Output:   Intake/Output Summary (Last 24 hours) at 10/10/2023 1120 Last data filed at 10/10/2023 0910 Gross per 24 hour  Intake 944.15 ml  Output 1050 ml  Net -105.85 ml      Physical Exam   General:  Lying in bed. No resp difficulty HEENT: normal Neck: supple. no JVD. Carotids 2+ bilat; no bruits. No lymphadenopathy or thryomegaly appreciated. Cor: PMI nondisplaced. Regular rate & rhythm. No rubs, gallops or murmurs. Lungs: clear Abdomen: obese soft, nontender, nondistended. No hepatosplenomegaly. No bruits or masses. Good bowel sounds. Extremities: no cyanosis, clubbing, rash, edema Neuro: alert & orientedx3, cranial nerves grossly intact. moves all 4 extremities w/o difficulty. Affect pleasant  Telemetry   Sinus 70s Personally  reviewed  Assessment/Plan   1. Acute on chronic diastolic heart failure:  - With biventricular involvement and pulmonary hypertension as outlined below, LVEF 60 to 65% - improving w/ diuresis but c/w volume overload on exam  - off lasix  gtt. Weight way down - Weights likely inaccurate (bed wts). Unable to stand  - Continue Entersto 49-51 mg bid (dose reduced due to hypotension)  - Continue Spironolactone  25 mg daily  - Continue Coreg  6.25 mg bid  - Continue hydralazine  50 tid  - Volume much improved. Agree with stopping lasix .  - Plan RHC today to objectively assess volume status   2. Pulmonary hypertension:  - Estimated PA systolic pressure 80 mmHg on most recent echo - Interestingly RV function is fairly well-preserved and the RV is not dilated - Suspect primarily who group 2 with severely uncontrolled hypertension - Potentially a group 3 component as well - RHC today  3. 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 Type 2 Diabetes:  - long standing. Poorly controlled, recent Hgb A1c 9.7  - on insulin  - management per IM   5. Hypokalemia/Hypomagnesemia - supp as needed   Length of Stay: 10  Jules Oar, MD  10/10/2023, 11:20 AM  Advanced Heart Failure Team Pager [phone] (M-F; 7a - 5p)  Please contact [facility] Card[facility] for night-coverage  after hours (5p -7a ) and weekends on [email or web address]

## 2023-10-10 NOTE — Progress Notes (Signed)
 Progress Note   Patient: Alexis Farmer ZOX:[id number] DOB: July 31, 1940 DOA: 09/29/2023     10 DOS: the patient was seen and examined on 10/10/2023     Brief hospital course:   "Alexis Farmer is a 83 y.o. female with medical history significant for monoclonal B-cell lymphoma, stage IIIb CKD, DM, HTN, HFpEF, chronic pain, obesity, immobility , hospitalized in February with a UTI, and in November with RSV who presents by EMS with a 2-week history of shortness of breath.  She states since her RSV infection her breathing never got quite right however over the past few days she has had worsening shortness of breath, with orthopnea and lower extremity edema. ED course and data review: SBP in the 200s HB at 11.6 and platelets 670. Creatinine slightly above baseline at 1.83 Glucose 221 EKG, personally viewed and interpreted showing sinus at 71 with LVH and no acute ST-T wave changes. Chest x-ray with findings suggestive of CHF Patient given IV Lasix  60 mg Hospitalist consulted for admission. "     Further hospital course and management as outlined below.       Assessment and Plan:   * Acute on chronic heart failure with preserved ejection fraction (HFpEF) (HCC) Hypertensive urgency Elevated troponin Myocardial injury Elevated troponin likely related to demand ischemia from CHF and elevated BP  Patient denies having any chest pain and shortness of breath has improved.   Weaned from 4 >> 2 L/min O2 >> room air --Cardiology, AHF team following, appreciate management --Initially on IV Lasix  BID dosing --Transitioned to Lasix  drip 4/17, by cardiology team Monitor electrolytes closely while was on Lasix  drip --May need to place Foley for accurate Io's  --Imdur  stopped due to headaches --Wean off oxygen as tolerated --CHF GDMT & HTN meds as below Continue input and output monitoring as well as daily weight --Right heart cath planned once volume status optimized   Hypertensive Urgency  -- BP's severely elevated with systolic 200 Uncontrolled and may be rebound since patient was on clonidine  but has not been resumed Hypotension -- 4/20 low BP's after escalation of meds past few days --Cardiology following --On Lasix  drip as above, Diamox , Metolazone  Continue Coreg  and spironolactone  --Entresto  reduced to 49-51 mg BID --Hydralazine  resumed 50 mg TID   Uncontrolled type 2 diabetes mellitus with hyperglycemia, with long-term current with renal complications A1c 9.7% Patient on NPH and regular insulin  at home --Increase Semglee  15 >> 20 units --3 units Novolog  TID WC + moderate sliding scale --Carb modified diet   AKI superimposed on CKD stage 3b  Creatinine 1.88 up from baseline of 1.4 Monitor for worsening renal function in view of IV diuretic therapy --Hold spironolactone  and Avapro  Continue to monitor renal function Currently on Lasix  drip   Obesity, Class III, BMI 40-49.9 (morbid obesity) (HCC) Complicating factor to overall prognosis and care Lifestyle modification and exercise has been discussed with patient in detail    Family Communication: Daughter and granddaughter    Disposition: Status is: Inpatient Remains inpatient appropriate because: Remains on Lasix  drip and ongoing evaluation with cardiology      Planned Discharge Destination: SNF recommended     Time spent: 41 minutes   Subjective:  Patient seen and examined at bedside this morning Currently requiring Lasix  drip as recommended by cardiology Denies nausea vomiting abdominal pain or chest pain   Physical Exam:   General exam: awake & alert, no acute distress, obese HEENT: moist mucus membranes, hearing grossly normal  Respiratory system: CTAB, no wheezes,  normal respiratory effort. Cardiovascular system: normal S1/S2, RRR, severe 2-3+ BLE pitting edema is slowly improving with notable skin wrinkling, facial and hand edema has significantly improved.   Gastrointestinal system: soft, NT,  ND Central nervous system: A&O x3. no gross focal neurologic deficits, normal speech Psychiatry: normal mood, congruent affect, judgement and insight appear normal     Data Reviewed:   Vitals:   10/10/23 1420 10/10/23 1445 10/10/23 1500 10/10/23 1525  BP: (!) 151/78 (!) 156/52  (!) 158/52  Pulse: 62 61 60 67  Resp: 11 15 15 12   Temp:  98.2 F (36.8 C)  98 F (36.7 C)  TempSrc:    Oral  SpO2: 94% 96% 99% 98%  Weight:      Height:          Latest Ref Rng & Units 10/10/2023    1:57 PM 10/10/2023    1:56 PM 10/10/2023    3:04 AM  CBC  WBC 4.0 - 10.5 K/uL   9.3   Hemoglobin 12.0 - 15.0 g/dL 19.1  47.8  29.5   Hematocrit 36.0 - 46.0 % 39.0  39.0  35.2   Platelets 150 - 400 K/uL   650        Latest Ref Rng & Units 10/10/2023    1:57 PM 10/10/2023    1:56 PM 10/10/2023    3:04 AM  BMP  Glucose 70 - 99 mg/dL   621   BUN 8 - 23 mg/dL   30   Creatinine 3.08 - 1.00 mg/dL   6.57   Sodium 846 - 962 mmol/L 133  134  132   Potassium 3.5 - 5.1 mmol/L 3.5  3.4  3.9   Chloride 98 - 111 mmol/L   87   CO2 22 - 32 mmol/L   33   Calcium  8.9 - 10.3 mg/dL   8.7      Author: Ezzard Holms, MD 10/10/2023 4:17 PM  For on call review [email or web address].

## 2023-10-11 ENCOUNTER — Other Ambulatory Visit: Payer: Self-pay

## 2023-10-11 ENCOUNTER — Encounter: Payer: Self-pay | Admitting: Internal Medicine

## 2023-10-11 DIAGNOSIS — I5033 Acute on chronic diastolic (congestive) heart failure: Secondary | ICD-10-CM | POA: Diagnosis not present

## 2023-10-11 LAB — CBC WITH DIFFERENTIAL/PLATELET
Abs Immature Granulocytes: 0.01 10*3/uL (ref 0.00–0.07)
Basophils Absolute: 0 10*3/uL (ref 0.0–0.1)
Basophils Relative: 1 %
Eosinophils Absolute: 0.1 10*3/uL (ref 0.0–0.5)
Eosinophils Relative: 1 %
HCT: 35.7 % — ABNORMAL LOW (ref 36.0–46.0)
Hemoglobin: 11.4 g/dL — ABNORMAL LOW (ref 12.0–15.0)
Immature Granulocytes: 0 %
Lymphocytes Relative: 51 %
Lymphs Abs: 4.1 10*3/uL — ABNORMAL HIGH (ref 0.7–4.0)
MCH: 28.8 pg (ref 26.0–34.0)
MCHC: 31.9 g/dL (ref 30.0–36.0)
MCV: 90.2 fL (ref 80.0–100.0)
Monocytes Absolute: 0.9 10*3/uL (ref 0.1–1.0)
Monocytes Relative: 12 %
Neutro Abs: 2.8 10*3/uL (ref 1.7–7.7)
Neutrophils Relative %: 35 %
Platelets: 641 10*3/uL — ABNORMAL HIGH (ref 150–400)
RBC: 3.96 MIL/uL (ref 3.87–5.11)
RDW: 14.1 % (ref 11.5–15.5)
WBC: 8 10*3/uL (ref 4.0–10.5)
nRBC: 0 % (ref 0.0–0.2)

## 2023-10-11 LAB — BASIC METABOLIC PANEL WITH GFR
Anion gap: 10 (ref 5–15)
BUN: 36 mg/dL — ABNORMAL HIGH (ref 8–23)
CO2: 34 mmol/L — ABNORMAL HIGH (ref 22–32)
Calcium: 8.5 mg/dL — ABNORMAL LOW (ref 8.9–10.3)
Chloride: 86 mmol/L — ABNORMAL LOW (ref 98–111)
Creatinine, Ser: 2.2 mg/dL — ABNORMAL HIGH (ref 0.44–1.00)
GFR, Estimated: 22 mL/min — ABNORMAL LOW (ref >60)
Glucose, Bld: 274 mg/dL — ABNORMAL HIGH (ref 70–99)
Potassium: 3.8 mmol/L (ref 3.5–5.1)
Sodium: 130 mmol/L — ABNORMAL LOW (ref 135–145)

## 2023-10-11 LAB — GLUCOSE, CAPILLARY
Glucose-Capillary: 267 mg/dL — ABNORMAL HIGH (ref 70–99)
Glucose-Capillary: 269 mg/dL — ABNORMAL HIGH (ref 70–99)
Glucose-Capillary: 174 mg/dL — ABNORMAL HIGH (ref 70–99)
Glucose-Capillary: 201 mg/dL — ABNORMAL HIGH (ref 70–99)

## 2023-10-11 MED ORDER — ENOXAPARIN SODIUM 40 MG/0.4ML IJ SOSY: 40.0000 mg | PREFILLED_SYRINGE | INTRAMUSCULAR | Status: DC

## 2023-10-11 MED ORDER — ENOXAPARIN SODIUM 40 MG/0.4ML IJ SOSY
40.0000 mg | PREFILLED_SYRINGE | INTRAMUSCULAR | Status: DC
  Administered 2023-10-11 – 2023-10-15 (×5): 40 mg via SUBCUTANEOUS
  Filled 2023-10-11 (×5): qty 0.4

## 2023-10-11 NOTE — Progress Notes (Signed)
 Patient ID: Alexis Farmer, female   DOB: 1940-08-26, 83 y.o.   MRN: [id number]     Advanced Heart Failure Rounding Note  PCP-Cardiologist: Belva Boyden, MD   Patient Profile   Alexis Farmer is a 83 y.o. female with a PMH of resistant hypertension, chronic HFpEF, obesity, type 2 diabetes, CKD stage IIIb who is seen today for evaluation of acute on chronic diastolic heart failure at the request of Dr Gollan.   Subjective:     RHC yesterday with low filling pressures.  Diuretics have been stopped. Weight down 6 lbs.  Creatinine stable at 2.2.  She complains of leg pain and abdominal pain.  No dyspnea.   Objective:   Weight Range: 105.6 kg Body mass index is 41.24 kg/m.   Vital Signs:   Temp:  [97.8 F (36.6 C)-99.3 F (37.4 C)] 97.8 F (36.6 C) (04/25 1154) Pulse Rate:  [60-68] 60 (04/25 1154) Resp:  [18-19] 19 (04/25 1154) BP: (101-170)/(41-48) 101/41 (04/25 1154) SpO2:  [90 %-98 %] 98 % (04/25 1154) Weight:  [105.6 kg] 105.6 kg (04/25 0500) Last BM Date : 10/09/23  Weight change: Filed Weights   10/08/23 0559 10/10/23 0500 10/11/23 0500  Weight: 112.8 kg 108.4 kg 105.6 kg    Intake/Output:   Intake/Output Summary (Last 24 hours) at 10/11/2023 1617 Last data filed at 10/11/2023 0900 Gross per 24 hour  Intake 330 ml  Output 300 ml  Net 30 ml      Physical Exam   General: NAD Neck: No JVD, no thyromegaly or thyroid  nodule.  Lungs: Clear to auscultation bilaterally with normal respiratory effort. CV: Nondisplaced PMI.  Heart regular S1/S2, no S3/S4, no murmur.  No peripheral edema.   Abdomen: Soft, nontender, no hepatosplenomegaly, no distention.  Skin: Intact without lesions or rashes.  Neurologic: Alert and oriented x 3.  Psych: Normal affect. Extremities: No clubbing or cyanosis.  HEENT: Normal.   Telemetry   Sinus 70s (Personally reviewed)  Assessment/Plan   1. Acute on chronic diastolic heart failure:  - With biventricular involvement and  pulmonary hypertension as outlined below, LVEF 60 to 65% - She is no longer volume overloaded and diuretics on hold.  If creatinine remains stable, start torsemide  20 mg daily tomorrow (on 10 mg daily at home).  - Continue Spironolactone  25 mg daily  - Continue Coreg  6.25 mg bid  - Continue hydralazine  50 tid  - Off Entresto  with elevated creatinine.    2. Pulmonary hypertension:  - Mild PAH on RHC.    3. 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    4. Type 2 Diabetes:  - long standing. Poorly controlled, recent Hgb A1c 9.7  - on insulin  - management per IM   5. Hypokalemia/Hypomagnesemia - supp as needed  Mobilize.    Length of Stay: 50  [name], MD  10/11/2023, 4:17 PM  Advanced Heart Failure Team Pager [phone] (M-F; 7a - 5p)  Please contact [facility] Cardiology for night-coverage after hours (5p -7a ) and weekends on [email or web address]

## 2023-10-11 NOTE — Progress Notes (Signed)
 Heart Failure Navigator Progress Note  Patients original HF TOC appointment on 10/14/23 @ 11:00 AM was rescheduled due to patient still hospitalized.  New appointment scheduled for 10/17/23 @ 11:30 AM .  New appointment card and AVS updated.   Celedonio Coil, RN, BSN [facility] Heart Failure Navigator Secure Chat Only

## 2023-10-11 NOTE — Progress Notes (Signed)
 Physical Therapy Treatment Patient Details Name: Alexis Farmer MRN: [id number] DOB: 1941-04-19 Today's Date: 10/11/2023   History of Present Illness Alexis Farmer is an 83 y.o. female with medical history significant for monoclonal B-cell lymphoma, stage IIIb CKD, DM, HTN, HFpEF, chronic pain, obesity, immobility, hospitalized in February with a UTI, and in November with RSV who presents by EMS with a 2-week history of shortness of breath with orthopnea and lower extremity edema. MD assessment includes: acute on chronic heart failure, hypertensive urgency, elevated troponin likely demand ischemia, myocardial injury, hypotension, uncontrolled DM, and AKI on CKD 3b.    PT Comments  With min encouragement pt willing to participate during the session. Pt required min A and extra time and effort with functional tasks.  Once in standing pt leaned heavily on the RW with trunk flexed and needed cuing to stand more upright and to take steps closer to the RW for safety.  Pt was able to amb a max of just a few steps before fatiguing and needing to return to sitting with SpO2 and HR WNL on RW.  Pt will benefit from continued PT services upon discharge to safely address deficits listed in patient problem list for decreased caregiver assistance and eventual return to PLOF.     If plan is discharge home, recommend the following: A lot of help with walking and/or transfers;A lot of help with bathing/dressing/bathroom;Assistance with cooking/housework;Direct supervision/assist for medications management;Assist for transportation   Can travel by private vehicle     No  Equipment Recommendations  None recommended by PT    Recommendations for Other Services       Precautions / Restrictions Precautions Precautions: Fall Recall of Precautions/Restrictions: Intact Restrictions Weight Bearing Restrictions Per Provider Order: No     Mobility  Bed Mobility Overal bed mobility: Needs Assistance        Supine to sit: Min assist          Transfers Overall transfer level: Needs assistance Equipment used: Rolling walker (2 wheels) Transfers: Sit to/from Stand Sit to Stand: Min assist, From elevated surface           General transfer comment: Mod verbal cues for hand placement and increased trunk flexion with EOB needing to be elevated to come to standing    Ambulation/Gait Ambulation/Gait assistance: Contact guard assist Gait Distance (Feet): 3 Feet Assistive device: Rolling walker (2 wheels) Gait Pattern/deviations: Step-through pattern, Decreased step length - right, Decreased step length - left, Wide base of support, Trunk flexed Gait velocity: decreased     General Gait Details: Heavy UE lean on the RW for support with mod cues for upright posture and amb closer to the RW for safety   Stairs             Wheelchair Mobility     Tilt Bed    Modified Rankin (Stroke Patients Only)       Balance Overall balance assessment: Needs assistance   Sitting balance-Leahy Scale: Good     Standing balance support: Bilateral upper extremity supported, During functional activity, Reliant on assistive device for balance Standing balance-Leahy Scale: Fair                              Hotel manager: No apparent difficulties  Cognition Arousal: Alert Behavior During Therapy: WFL for tasks assessed/performed   PT - Cognitive impairments: No apparent impairments, History of cognitive impairments  Following commands: Intact      Cueing Cueing Techniques: Verbal cues  Exercises Other Exercises Other Exercises: Pt education provided on physiological benefits of activity    General Comments        Pertinent Vitals/Pain Pain Assessment Pain Assessment: 0-10 Pain Score: 8  Pain Location: general body pain Pain Descriptors / Indicators: Aching, Sore Pain Intervention(s): Repositioned,  Monitored during session, Patient requesting pain meds-RN notified    Home Living                          Prior Function            PT Goals (current goals can now be found in the care plan section) Progress towards PT goals: Progressing toward goals    Frequency    Min 2X/week      PT Plan      Co-evaluation              AM-PAC PT "6 Clicks" Mobility   Outcome Measure  Help needed turning from your back to your side while in a flat bed without using bedrails?: A Little Help needed moving from lying on your back to sitting on the side of a flat bed without using bedrails?: A Little Help needed moving to and from a bed to a chair (including a wheelchair)?: A Little Help needed standing up from a chair using your arms (e.g., wheelchair or bedside chair)?: A Little Help needed to walk in hospital room?: A Lot Help needed climbing 3-5 steps with a railing? : A Lot 6 Click Score: 16    End of Session Equipment Utilized During Treatment: Gait belt Activity Tolerance: Patient limited by fatigue Patient left: with call bell/phone within reach;in chair;with chair alarm set;with nursing/sitter in room Nurse Communication: Mobility status;Patient requests pain meds PT Visit Diagnosis: Muscle weakness (generalized) (M62.81);Difficulty in walking, not elsewhere classified (R26.2)     Time: 6045-4098 PT Time Calculation (min) (ACUTE ONLY): 21 min  Charges:    $Therapeutic Activity: 8-22 mins PT General Charges $$ ACUTE PT VISIT: 1 Visit                     D. Scott Nazar Kuan PT, DPT 10/11/23, 11:25 AM

## 2023-10-11 NOTE — Progress Notes (Signed)
 [facility] Liaison Note  10/11/2023  Barbarann Kelly 1941-03-28 [id number]  Location: RN Hospital Liaison screened the patient remotely at [facility].  Insurance: Medicare [employer] Indemnity    Alexis Farmer is a 83 y.o. female who is a Primary Care Patient of Alexis Gaw, MD [facility]. The patient was screened for readmission hospitalization with noted high risk score for unplanned readmission risk with 1 IP/1 ED in 6 months.  Patient is currently active with Care Management for chronic disease management services.  Patient has been engaged by a Presenter, broadcasting and Child psychotherapist.  Our community based plan of care has focused on disease management and community resource support.   Patient will receive a post hospital call and will be evaluated for assessments and disease process education. Liaison will collaborate with the VBCI involved team members concerning pt's disposition.    Review of patient's electronic medical record reveals patient was admitted with Shortness of Breath. Pt opt to declined the recommended SNF as TOC documents [facility] PT/OT.   Inpatient Transition Of Care [TOC] team member to make aware that Care Management following.   V[facility] Care Management/Population Health does not replace or interfere with any arrangements made by the Inpatient Transition of Care team.   For questions contact:   Lilla Reichert, RN, BSN Hospital Liaison Belt   [facility], Population Health Office Hours MTWF  8:00 am-6:00 pm Direct Dial: [phone] [email or web address]

## 2023-10-11 NOTE — Progress Notes (Signed)
 Progress Note   Patient: Alexis Farmer RUE:[id number] DOB: [birth year] DOA: 09/29/2023     11 DOS: the patient was seen and examined on 10/11/2023    Brief hospital course:   "Alexis Farmer is a 83 y.o. female with medical history significant for monoclonal B-cell lymphoma, stage IIIb CKD, DM, HTN, HFpEF, chronic pain, obesity, immobility , hospitalized in February with a UTI, and in November with RSV who presents by EMS with a 2-week history of shortness of breath.  She states since her RSV infection her breathing never got quite right however over the past few days she has had worsening shortness of breath, with orthopnea and lower extremity edema. ED course and data review: SBP in the 200s HB at 11.6 and platelets 670. Creatinine slightly above baseline at 1.83 Glucose 221 EKG, personally viewed and interpreted showing sinus at 71 with LVH and no acute ST-T wave changes. Chest x-ray with findings suggestive of CHF Patient given IV Lasix  60 mg Hospitalist consulted for admission. "     Further hospital course and management as outlined below.       Assessment and Plan:   * Acute on chronic heart failure with preserved ejection fraction (HFpEF) (HCC) Hypertensive urgency Elevated troponin Myocardial injury Elevated troponin likely related to demand ischemia from CHF and elevated BP  Patient denies having any chest pain and shortness of breath has improved.   Weaned from 4 >> 2 L/min O2 >> room air --Cardiology, AHF team following, appreciate management IV Lasix  discontinued Cardiologist planning to start metolazone  by tomorrow Currently off Entresto  given renal function --Imdur  stopped due to headaches --Wean off oxygen as tolerated --CHF GDMT & HTN meds as below Continue input and output monitoring as well as daily weight --Right heart cath planned once volume status optimized   Hypertensive Urgency -- BP's severely elevated with systolic 200 Uncontrolled and may be  rebound since patient was on clonidine  but has not been resumed  Hypotension -- 4/20 low BP's after escalation of meds past few days --Cardiology following Continue Coreg  and spironolactone  --Hydralazine  resumed 50 mg TID   Uncontrolled type 2 diabetes mellitus with hyperglycemia, with long-term current with renal complications A1c 9.7% Patient on NPH and regular insulin  at home --Increase Semglee  15 >> 20 units --3 units Novolog  TID WC + moderate sliding scale --Carb modified diet   AKI superimposed on CKD stage 3b  Creatinine 1.88 up from baseline of 1.4 Monitor for worsening renal function in view of IV diuretic therapy --Hold spironolactone  and Avapro  Continue to monitor renal function Currently on Lasix  drip   Obesity, Class III, BMI 40-49.9 (morbid obesity) (HCC) Complicating factor to overall prognosis and care Lifestyle modification and exercise has been discussed with patient in detail    Family Communication: Daughter and granddaughter    Disposition: Status is: Inpatient Remains inpatient appropriate because: Remains on Lasix  drip and ongoing evaluation with cardiology      Planned Discharge Destination: SNF recommended     Time spent: 40 minutes   Subjective:  Patient seen and examined at bedside this morning Patient has currently been weaned off Lasix  Cardiologist planning to start metolazone  by tomorrow   Physical Exam:   General exam: awake & alert, no acute distress, obese HEENT: moist mucus membranes, hearing grossly normal  Respiratory system: CTAB, no wheezes, normal respiratory effort. Cardiovascular system: normal S1/S2, RRR, severe 2-3+ BLE pitting edema is slowly improving with notable skin wrinkling, facial and hand edema has significantly improved.   Gastrointestinal  system: soft, NT, ND Central nervous system: A&O x3. no gross focal neurologic deficits, normal speech Psychiatry: normal mood, congruent affect, judgement and insight appear  normal     Data Reviewed:      Vitals:   10/11/23 0500 10/11/23 0739 10/11/23 1154 10/11/23 1633  BP:  (!) 141/48 (!) 101/41 134/84  Pulse:  68 60 71  Resp:  19 19 19   Temp:  97.8 F (36.6 C) 97.8 F (36.6 C) 98.2 F (36.8 C)  TempSrc:  Oral Oral   SpO2:  97% 98% 92%  Weight: 105.6 kg     Height:          Latest Ref Rng & Units 10/11/2023    5:42 AM 10/10/2023    1:57 PM 10/10/2023    1:56 PM  CBC  WBC 4.0 - 10.5 K/uL 8.0     Hemoglobin 12.0 - 15.0 g/dL 78.4  69.6  29.5   Hematocrit 36.0 - 46.0 % 35.7  39.0  39.0   Platelets 150 - 400 K/uL 641          Latest Ref Rng & Units 10/11/2023    5:42 AM 10/10/2023    1:57 PM 10/10/2023    1:56 PM  BMP  Glucose 70 - 99 mg/dL 284     BUN 8 - 23 mg/dL 36     Creatinine 1.32 - 1.00 mg/dL 4.40     Sodium 102 - 725 mmol/L 130  133  134   Potassium 3.5 - 5.1 mmol/L 3.8  3.5  3.4   Chloride 98 - 111 mmol/L 86     CO2 22 - 32 mmol/L 34     Calcium  8.9 - 10.3 mg/dL 8.5        Author: Ezzard Holms, MD 10/11/2023 5:07 PM  For on call review [email or web address].

## 2023-10-11 NOTE — Plan of Care (Signed)
  Problem: Education: Goal: Individualized Educational Video(s) Outcome: Progressing   Problem: Fluid Volume: Goal: Ability to maintain a balanced intake and output will improve Outcome: Progressing   Problem: Coping: Goal: Ability to adjust to condition or change in health will improve Outcome: Progressing   Problem: Skin Integrity: Goal: Risk for impaired skin integrity will decrease Outcome: Progressing   Problem: Education: Goal: Knowledge of General Education information will improve Description: Including pain rating scale, medication(s)/side effects and non-pharmacologic comfort measures Outcome: Progressing   Problem: Tissue Perfusion: Goal: Adequacy of tissue perfusion will improve Outcome: Progressing

## 2023-10-11 NOTE — TOC Progression Note (Signed)
 Transition of Care [facility]) - Progression Note    Patient Details  Name: Alexis Farmer MRN: [id number] Date of Birth: 10-07-1940  Transition of Care [facility]) CM/SW Contact  Baird Bombard, RN Phone Number: 10/11/2023, 3:39 PM  Clinical Narrative:    Attempt to reach patient's granddaughter, no answer. Left a message.     Expected Discharge Plan: Home w Home Health Services Barriers to Discharge: Continued Medical Work up  Expected Discharge Plan and Services       Living arrangements for the past 2 months: Single Family Home                           HH Arranged: PT, OT HH Agency: [facility] Date HH Agency Contacted: 10/02/23   Representative spoke with at [facility] Agency: Shaun   Social Determinants of Health (SDOH) Interventions SDOH Screenings   Food Insecurity: No Food Insecurity (10/03/2023)  Recent Concern: Food Insecurity - Food Insecurity Present (10/03/2023)  Housing: Low Risk  (10/03/2023)  Transportation Needs: No Transportation Needs (10/03/2023)  Recent Concern: Transportation Needs - Unmet Transportation Needs (09/23/2023)  Utilities: Not At Risk (09/30/2023)  Alcohol Screen: Low Risk  (11/29/2022)  Depression (PHQ2-9): High Risk (09/23/2023)  Financial Resource Strain: Medium Risk (10/03/2023)  Physical Activity: Inactive (10/03/2023)  Social Connections: Moderately Integrated (09/30/2023)  Stress: Stress Concern Present (11/29/2022)  Tobacco Use: Low Risk  (09/30/2023)    Readmission Risk Interventions    10/03/2023   12:03 PM  Readmission Risk Prevention Plan  Transportation Screening Complete  PCP or Specialist Appt within 3-5 Days Complete  HRI or Home Care Consult Complete  Social Work Consult for Recovery Care Planning/Counseling Complete  Palliative Care Screening Not Applicable  Medication Review [employer]) Complete

## 2023-10-11 NOTE — Plan of Care (Signed)
  Problem: Education: Goal: Individualized Educational Video(s) Outcome: Progressing   Problem: Fluid Volume: Goal: Ability to maintain a balanced intake and output will improve Outcome: Progressing   Problem: Health Behavior/Discharge Planning: Goal: Ability to identify and utilize available resources and services will improve Outcome: Progressing Goal: Ability to manage health-related needs will improve Outcome: Progressing   Problem: Clinical Measurements: Goal: Ability to maintain clinical measurements within normal limits will improve Outcome: Progressing Goal: Will remain free from infection Outcome: Progressing Goal: Diagnostic test results will improve Outcome: Progressing Goal: Respiratory complications will improve Outcome: Progressing Goal: Cardiovascular complication will be avoided Outcome: Progressing   Problem: Pain Managment: Goal: General experience of comfort will improve and/or be controlled Outcome: Progressing   Problem: Safety: Goal: Ability to remain free from injury will improve Outcome: Progressing

## 2023-10-12 DIAGNOSIS — I5033 Acute on chronic diastolic (congestive) heart failure: Secondary | ICD-10-CM | POA: Diagnosis not present

## 2023-10-12 LAB — GLUCOSE, CAPILLARY
Glucose-Capillary: 259 mg/dL — ABNORMAL HIGH (ref 70–99)
Glucose-Capillary: 183 mg/dL — ABNORMAL HIGH (ref 70–99)
Glucose-Capillary: 65 mg/dL — ABNORMAL LOW (ref 70–99)
Glucose-Capillary: 75 mg/dL (ref 70–99)
Glucose-Capillary: 119 mg/dL — ABNORMAL HIGH (ref 70–99)

## 2023-10-12 LAB — CBC WITH DIFFERENTIAL/PLATELET
Abs Immature Granulocytes: 0.02 10*3/uL (ref 0.00–0.07)
Basophils Absolute: 0.1 10*3/uL (ref 0.0–0.1)
Basophils Relative: 1 %
Eosinophils Absolute: 0 10*3/uL (ref 0.0–0.5)
Eosinophils Relative: 0 %
HCT: 36.2 % (ref 36.0–46.0)
Hemoglobin: 11.9 g/dL — ABNORMAL LOW (ref 12.0–15.0)
Immature Granulocytes: 0 %
Lymphocytes Relative: 34 %
Lymphs Abs: 2.6 10*3/uL (ref 0.7–4.0)
MCH: 28.7 pg (ref 26.0–34.0)
MCHC: 32.9 g/dL (ref 30.0–36.0)
MCV: 87.2 fL (ref 80.0–100.0)
Monocytes Absolute: 0.6 10*3/uL (ref 0.1–1.0)
Monocytes Relative: 8 %
Neutro Abs: 4.3 10*3/uL (ref 1.7–7.7)
Neutrophils Relative %: 57 %
Platelets: 632 10*3/uL — ABNORMAL HIGH (ref 150–400)
RBC: 4.15 MIL/uL (ref 3.87–5.11)
RDW: 13.9 % (ref 11.5–15.5)
WBC: 7.7 10*3/uL (ref 4.0–10.5)
nRBC: 0 % (ref 0.0–0.2)

## 2023-10-12 LAB — BASIC METABOLIC PANEL WITH GFR
Anion gap: 10 (ref 5–15)
BUN: 38 mg/dL — ABNORMAL HIGH (ref 8–23)
CO2: 29 mmol/L (ref 22–32)
Calcium: 8.6 mg/dL — ABNORMAL LOW (ref 8.9–10.3)
Chloride: 92 mmol/L — ABNORMAL LOW (ref 98–111)
Creatinine, Ser: 2.27 mg/dL — ABNORMAL HIGH (ref 0.44–1.00)
GFR, Estimated: 21 mL/min — ABNORMAL LOW (ref >60)
Glucose, Bld: 229 mg/dL — ABNORMAL HIGH (ref 70–99)
Potassium: 3.6 mmol/L (ref 3.5–5.1)
Sodium: 131 mmol/L — ABNORMAL LOW (ref 135–145)

## 2023-10-12 LAB — LIPOPROTEIN A (LPA): Lipoprotein (a): 200.7 nmol/L — ABNORMAL HIGH (ref <75.0)

## 2023-10-12 MED ORDER — TORSEMIDE 20 MG PO TABS
20.0000 mg | ORAL_TABLET | Freq: Every day | ORAL | Status: DC
  Administered 2023-10-12 – 2023-10-13 (×2): 20 mg via ORAL
  Filled 2023-10-12 (×2): qty 1

## 2023-10-12 MED ORDER — MORPHINE SULFATE ER 15 MG PO TBCR
15.0000 mg | EXTENDED_RELEASE_TABLET | Freq: Two times a day (BID) | ORAL | Status: DC
  Administered 2023-10-12 – 2023-10-13 (×3): 15 mg via ORAL
  Filled 2023-10-12 (×6): qty 1

## 2023-10-12 NOTE — Plan of Care (Signed)
  Problem: Education: Goal: Individualized Educational Video(s) Outcome: Progressing   Problem: Coping: Goal: Ability to adjust to condition or change in health will improve Outcome: Progressing   Problem: Fluid Volume: Goal: Ability to maintain a balanced intake and output will improve Outcome: Progressing   Problem: Health Behavior/Discharge Planning: Goal: Ability to identify and utilize available resources and services will improve Outcome: Progressing Goal: Ability to manage health-related needs will improve Outcome: Progressing   Problem: Metabolic: Goal: Ability to maintain appropriate glucose levels will improve Outcome: Progressing   Problem: Nutritional: Goal: Maintenance of adequate nutrition will improve Outcome: Progressing Goal: Progress toward achieving an optimal weight will improve Outcome: Progressing   Problem: Skin Integrity: Goal: Risk for impaired skin integrity will decrease Outcome: Progressing   Problem: Tissue Perfusion: Goal: Adequacy of tissue perfusion will improve Outcome: Progressing   Problem: Education: Goal: Knowledge of General Education information will improve Description: Including pain rating scale, medication(s)/side effects and non-pharmacologic comfort measures Outcome: Progressing   Problem: Health Behavior/Discharge Planning: Goal: Ability to manage health-related needs will improve Outcome: Progressing   Problem: Clinical Measurements: Goal: Ability to maintain clinical measurements within normal limits will improve Outcome: Progressing Goal: Will remain free from infection Outcome: Progressing Goal: Diagnostic test results will improve Outcome: Progressing Goal: Respiratory complications will improve Outcome: Progressing Goal: Cardiovascular complication will be avoided Outcome: Progressing   Problem: Activity: Goal: Risk for activity intolerance will decrease Outcome: Progressing   Problem: Nutrition: Goal:  Adequate nutrition will be maintained Outcome: Progressing   Problem: Coping: Goal: Level of anxiety will decrease Outcome: Progressing   Problem: Elimination: Goal: Will not experience complications related to bowel motility Outcome: Progressing Goal: Will not experience complications related to urinary retention Outcome: Progressing   Problem: Pain Managment: Goal: General experience of comfort will improve and/or be controlled Outcome: Progressing   Problem: Safety: Goal: Ability to remain free from injury will improve Outcome: Progressing   Problem: Skin Integrity: Goal: Risk for impaired skin integrity will decrease Outcome: Progressing   Problem: Education: Goal: Ability to demonstrate management of disease process will improve Outcome: Progressing Goal: Ability to verbalize understanding of medication therapies will improve Outcome: Progressing Goal: Individualized Educational Video(s) Outcome: Progressing   Problem: Activity: Goal: Capacity to carry out activities will improve Outcome: Progressing   Problem: Cardiac: Goal: Ability to achieve and maintain adequate cardiopulmonary perfusion will improve Outcome: Progressing   Problem: Education: Goal: Understanding of CV disease, CV risk reduction, and recovery process will improve Outcome: Progressing Goal: Individualized Educational Video(s) Outcome: Progressing   Problem: Activity: Goal: Ability to return to baseline activity level will improve Outcome: Progressing   Problem: Cardiovascular: Goal: Ability to achieve and maintain adequate cardiovascular perfusion will improve Outcome: Progressing Goal: Vascular access site(s) Level 0-1 will be maintained Outcome: Progressing   Problem: Health Behavior/Discharge Planning: Goal: Ability to safely manage health-related needs after discharge will improve Outcome: Progressing

## 2023-10-12 NOTE — Progress Notes (Signed)
 Rounding Note    Patient Name: Alexis Farmer Date of Encounter: 10/12/2023  [facility] Cardiologist: Belva Boyden, MD   Subjective   Patient seen on a.m. rounds.  Denies any active chest pain or shortness of breath.  States that she is continues to have full body discomfort on occasion and that she is having muscle aches in her bilateral lower extremities and abdomen.  Inpatient Medications    Scheduled Meds:  aspirin  EC  81 mg Oral Once per day on Monday Thursday   carvedilol   6.25 mg Oral BID WC   enoxaparin  (LOVENOX ) injection  40 mg Subcutaneous Q24H   hydrALAZINE   50 mg Oral Q8H   insulin  aspart  0-15 Units Subcutaneous TID WC   insulin  aspart  3 Units Subcutaneous TID WC   insulin  glargine-yfgn  20 Units Subcutaneous Daily   nystatin  cream   Topical BID   polyethylene glycol  17 g Oral Daily   rosuvastatin   20 mg Oral Daily   senna-docusate  1 tablet Oral BID   sodium chloride  flush  3 mL Intravenous Q12H   spironolactone   25 mg Oral Daily   torsemide   20 mg Oral Daily   Continuous Infusions:  PRN Meds: acetaminophen , benzonatate , bisacodyl , HYDROcodone -acetaminophen , hydrocortisone , ipratropium-albuterol , LORazepam , ondansetron  **OR** ondansetron  (ZOFRAN ) IV, mouth rinse, sodium chloride  flush, sodium phosphate , traZODone    Vital Signs    Vitals:   10/12/23 0322 10/12/23 0354 10/12/23 0500 10/12/23 0826  BP: (!) 180/55 109/60  (!) 164/48  Pulse: 67 71  69  Resp: 18   16  Temp: 99.3 F (37.4 C)   98.6 F (37 C)  TempSrc: Oral     SpO2: 96%   95%  Weight:   107.9 kg   Height:        Intake/Output Summary (Last 24 hours) at 10/12/2023 1138 Last data filed at 10/12/2023 1100 Gross per 24 hour  Intake 0 ml  Output 2 ml  Net -2 ml      10/12/2023    5:00 AM 10/11/2023    5:00 AM 10/10/2023    5:00 AM  Last 3 Weights  Weight (lbs) 237 lb 14 oz 232 lb 12.9 oz 238 lb 15.7 oz  Weight (kg) 107.9 kg 105.6 kg 108.4 kg      Telemetry     Sinus rhythm first-degree AV block needed focal PVCs rates of 60-70- Personally Reviewed  ECG    No new tracings- Personally Reviewed  Physical Exam   GEN: No acute distress.   Neck: No JVD Cardiac: RRR, no murmurs, rubs, or gallops.  Right brachial site from right heart catheterization completed on 10/06/2023 continues to have a gauze and OpSite dressing that is clean dry and intact Respiratory: Clear to auscultation bilaterally. GI: Soft, nontender, non-distended  MS: No edema; No deformity. Neuro:  Nonfocal  Psych: Normal affect   Labs    High Sensitivity Troponin:   Recent Labs  Lab 09/29/23 1836  TROPONINIHS 31*     Chemistry Recent Labs  Lab 10/06/23 0446 10/07/23 0424 10/07/23 1538 10/08/23 0447 10/09/23 0613 10/09/23 1317 10/10/23 0304 10/10/23 1356 10/10/23 1357 10/11/23 0542 10/12/23 0601  NA 137   < > 130*   < > 132* 132* 132*   < > 133* 130* 131*  K 3.6   < > 3.3*   < > 3.5 3.5 3.9   < > 3.5 3.8 3.6  CL 93*   < > 87*   < > 90*  88* 87*  --   --  86* 92*  CO2 31   < > 32   < > 34* 31 33*  --   --  34* 29  GLUCOSE 184*   < > 288*   < > 198* 164* 228*  --   --  274* 229*  BUN 25*   < > 26*   < > 28* 27* 30*  --   --  36* 38*  CREATININE 1.77*   < > 1.86*   < > 2.02* 2.20* 2.21*  --   --  2.20* 2.27*  CALCIUM  8.8*   < > 8.1*   < > 8.6* 8.9 8.7*  --   --  8.5* 8.6*  MG 1.9  --   --   --  1.7  --   --   --   --   --   --   PROT  --   --   --   --   --  6.0*  --   --   --   --   --   ALBUMIN  --   --  2.4*  --   --  2.5*  --   --   --   --   --   AST  --   --   --   --   --  47*  --   --   --   --   --   ALT  --   --   --   --   --  24  --   --   --   --   --   ALKPHOS  --   --   --   --   --  45  --   --   --   --   --   BILITOT  --   --   --   --   --  0.6  --   --   --   --   --   GFRNONAA 28*   < > 26*   < > 24* 22* 21*  --   --  22* 21*  ANIONGAP 13   < > 11   < > 8 13 12   --   --  10 10   < > = values in this interval not displayed.    Lipids No  results for input(s): "CHOL", "TRIG", "HDL", "LABVLDL", "LDLCALC", "CHOLHDL" in the last 168 hours.  Hematology Recent Labs  Lab 10/10/23 0304 10/10/23 1356 10/10/23 1357 10/11/23 0542 10/12/23 0601  WBC 9.3  --   --  8.0 7.7  RBC 3.95  --   --  3.96 4.15  HGB 11.4*   < > 13.3 11.4* 11.9*  HCT 35.2*   < > 39.0 35.7* 36.2  MCV 89.1  --   --  90.2 87.2  MCH 28.9  --   --  28.8 28.7  MCHC 32.4  --   --  31.9 32.9  RDW 14.2  --   --  14.1 13.9  PLT 650*  --   --  641* 632*   < > = values in this interval not displayed.   Thyroid  No results for input(s): "TSH", "FREET4" in the last 168 hours.  BNP Recent Labs  Lab 10/09/23 1317  BNP 223.7*    DDimer No results for input(s): "DDIMER" in the last 168 hours.   Radiology    CARDIAC CATHETERIZATION  Result Date: 10/10/2023 Findings: RA = 2 RV = 45/3 PA = 42/10 (17) PCW = 1 Fick cardiac output/index = 4.2/2.0 Thermo CO/CI = 4.4/2.1 PVR = 3.9 WU Ao sat = 90% PA sat = 50%, 55% PAPi = 11 Assessment: 1. Mild PAH 2. Very low filling pressures after marked diuresis 3. Moderately reduced CO in setting of hypovolemia Plan/Discussion: Will give some fluid. Back. Hold diuretics. Jules Oar, MD 2:10 PM   Cardiac Studies  RHC 10/10/23 Findings:   RA = 2 RV = 45/3 PA = 42/10 (17) PCW = 1 Fick cardiac output/index = 4.2/2.0 Thermo CO/CI = 4.4/2.1  PVR = 3.9 WU Ao sat = 90% PA sat = 50%, 55% PAPi = 11   Assessment: 1. Mild PAH 2. Very low filling pressures after marked diuresis 3. Moderately reduced CO in setting of hypovolemia   Plan/Discussion:    Will give some fluid. Back. Hold diuretics.   2D echo 09/30/2023 1. Left ventricular ejection fraction, by estimation, is 60 to 65%. The  left ventricle has normal function. The left ventricle has no regional  wall motion abnormalities. There is moderate left ventricular hypertrophy.  Left ventricular diastolic  parameters are consistent with Grade I diastolic dysfunction  (impaired  relaxation). Elevated left atrial pressure.   2. Right ventricular systolic function is normal. The right ventricular  size is normal. There is severely elevated pulmonary artery systolic  pressure. The estimated right ventricular systolic pressure is 80.0 mmHg.   3. Right atrial size was mildly dilated.   4. The mitral valve is normal in structure. Trivial mitral valve  regurgitation. No evidence of mitral stenosis.   5. The aortic valve is tricuspid. Aortic valve regurgitation is not  visualized. No aortic stenosis is present.   6. The inferior vena cava is dilated in size with <50% respiratory  variability, suggesting right atrial pressure of 15 mmHg.    Patient Profile     83 y.o. female with a past medical history of arthritis, anxiety, send hypertension, venous insufficiency/chronic lower extremity edema, mixed hyperlipidemia, uncontrolled type 2 diabetes, CKD stage IIIb, morbid obesity, chronic diastolic heart failure, and peripheral arterial disease who is being seen for the continued evaluation of acute on chronic diastolic heart failure  Assessment & Plan    Acute on chronic diastolic heart failure - Biventricular involvement with pulmonary hypertension LVEF 60 to 65% - No longer volume overloaded diuretics were placed on hold and restarted torsemide  20 mg daily today -Continued on spironolactone  25 mg daily carvedilol  6.25 mg twice daily, and hydralazine  50 mg 3 times daily -Remains off of Entresto  due to elevated serum creatinine - Daily weights and I's and O's  Pulmonary hypertension -Mild PAH noted on right heart catheterization  Primary hypertension -Blood pressure 164/48 -Continued on carvedilol  6.25 mg twice daily, hydralazine  50 mg 3 times daily, spironolactone  25 mg daily, torsemide  20 mg daily -Vital signs per unit protocol  AKI on CKD stage IIIb - Serum creatinine 2.27 remained stable -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

## 2023-10-12 NOTE — Progress Notes (Addendum)
 Progress Note   Patient: Alexis Farmer:[id number] DOB: May 26, 1941 DOA: 09/29/2023     12 DOS: the patient was seen and examined on 10/12/2023       Brief hospital course:   "Alexis Farmer is a 83 y.o. female with medical history significant for monoclonal B-cell lymphoma, stage IIIb CKD, DM, HTN, HFpEF, chronic pain, obesity, immobility , hospitalized in February with a UTI, and in November with RSV who presents by EMS with a 2-week history of shortness of breath.  She states since her RSV infection her breathing never got quite right however over the past few days she has had worsening shortness of breath, with orthopnea and lower extremity edema. ED course and data review: SBP in the 200s HB at 11.6 and platelets 670. Creatinine slightly above baseline at 1.83 Glucose 221 EKG, personally viewed and interpreted showing sinus at 71 with LVH and no acute ST-T wave changes. Chest x-ray with findings suggestive of CHF Patient given IV Lasix  60 mg Hospitalist consulted for admission. "     Further hospital course and management as outlined below.       Assessment and Plan:   * Acute on chronic heart failure with preserved ejection fraction (HFpEF) (HCC) Hypertensive urgency Elevated troponin Myocardial injury Elevated troponin likely related to demand ischemia from CHF and elevated BP  Patient denies having any chest pain and shortness of breath has improved.   Weaned from 4 >> 2 L/min O2 >> room air --Cardiology, AHF team following, appreciate management IV Lasix  discontinued Torsemide  initiated by cardiologist Currently off Entresto  given renal function --Imdur  stopped due to headaches --Wean off oxygen as tolerated --CHF GDMT & HTN meds as below Continue input and output monitoring as well as daily weight --Right heart cath planned once volume status optimized   Hypertensive Urgency -- BP's severely elevated with systolic 200 Uncontrolled and may be rebound since  patient was on clonidine  but has not been resumed   Hypotension -- 4/20 low BP's after escalation of meds past few days --Cardiology following Continue Coreg  and spironolactone  --Hydralazine  resumed 50 mg TID   Uncontrolled type 2 diabetes mellitus with hyperglycemia, with long-term current with renal complications A1c 9.7% Patient on NPH and regular insulin  at home --Increase Semglee  15 >> 20 units --3 units Novolog  TID WC + moderate sliding scale --Carb modified diet   AKI superimposed on CKD stage 3b  Creatinine 1.88 up from baseline of 1.4 Monitor for worsening renal function in view of IV diuretic therapy --Hold spironolactone  and Avapro  Continue to monitor renal function Currently on Lasix  drip   Obesity, Class III, BMI 40-49.9 (morbid obesity) (HCC) Complicating factor to overall prognosis and care Lifestyle modification and exercise has been discussed with patient in detail    Family Communication: Daughter and granddaughter    Disposition: Status is: Inpatient Remains inpatient appropriate because: Awaiting rehab placement    Planned Discharge Destination: SNF recommended     Time spent: 39 minutes   Subjective:  Patient seen and examined at bedside this morning Complains of generalized body pain Pain medication adjusted as MS Contin  added Cardiologist have seen patient and planning to start torsemide  today   Physical Exam:   General exam: awake & alert, no acute distress, obese HEENT: moist mucus membranes, hearing grossly normal  Respiratory system: CTAB, no wheezes, normal respiratory effort. Cardiovascular system: normal S1/S2, RRR, severe 2-3+ BLE pitting edema is slowly improving with notable skin wrinkling, facial and hand edema has significantly improved.  Gastrointestinal system: soft, NT, ND Central nervous system: A&O x3. no gross focal neurologic deficits, normal speech Psychiatry: normal mood, congruent affect, judgement and insight appear  normal     Data Reviewed: Vitals:   10/12/23 0354 10/12/23 0500 10/12/23 0826 10/12/23 1245  BP: 109/60  (!) 164/48 (!) 126/44  Pulse: 71  69 66  Resp:   16 17  Temp:   98.6 F (37 C) 99 F (37.2 C)  TempSrc:    Oral  SpO2:   95% 96%  Weight:  107.9 kg    Height:          Latest Ref Rng & Units 10/12/2023    6:01 AM 10/11/2023    5:42 AM 10/10/2023    1:57 PM  CBC  WBC 4.0 - 10.5 K/uL 7.7  8.0    Hemoglobin 12.0 - 15.0 g/dL 16.1  09.6  04.5   Hematocrit 36.0 - 46.0 % 36.2  35.7  39.0   Platelets 150 - 400 K/uL 632  641         Latest Ref Rng & Units 10/12/2023    6:01 AM 10/11/2023    5:42 AM 10/10/2023    1:57 PM  BMP  Glucose 70 - 99 mg/dL 409  811    BUN 8 - 23 mg/dL 38  36    Creatinine 9.14 - 1.00 mg/dL 7.82  9.56    Sodium 213 - 145 mmol/L 131  130  133   Potassium 3.5 - 5.1 mmol/L 3.6  3.8  3.5   Chloride 98 - 111 mmol/L 92  86    CO2 22 - 32 mmol/L 29  34    Calcium  8.9 - 10.3 mg/dL 8.6  8.5       Author: Ezzard Holms, MD 10/12/2023 3:19 PM  For on call review [email or web address].

## 2023-10-12 NOTE — Plan of Care (Signed)
  Problem: Education: Goal: Individualized Educational Video(s) Outcome: Progressing   Problem: Coping: Goal: Ability to adjust to condition or change in health will improve Outcome: Progressing   Problem: Fluid Volume: Goal: Ability to maintain a balanced intake and output will improve Outcome: Progressing   Problem: Health Behavior/Discharge Planning: Goal: Ability to identify and utilize available resources and services will improve Outcome: Progressing Goal: Ability to manage health-related needs will improve Outcome: Progressing

## 2023-10-13 DIAGNOSIS — I5033 Acute on chronic diastolic (congestive) heart failure: Secondary | ICD-10-CM | POA: Diagnosis not present

## 2023-10-13 LAB — BASIC METABOLIC PANEL WITH GFR
Anion gap: 5 (ref 5–15)
BUN: 38 mg/dL — ABNORMAL HIGH (ref 8–23)
CO2: 32 mmol/L (ref 22–32)
Calcium: 8.3 mg/dL — ABNORMAL LOW (ref 8.9–10.3)
Chloride: 93 mmol/L — ABNORMAL LOW (ref 98–111)
Creatinine, Ser: 2.55 mg/dL — ABNORMAL HIGH (ref 0.44–1.00)
GFR, Estimated: 18 mL/min — ABNORMAL LOW (ref >60)
Glucose, Bld: 166 mg/dL — ABNORMAL HIGH (ref 70–99)
Potassium: 3.5 mmol/L (ref 3.5–5.1)
Sodium: 130 mmol/L — ABNORMAL LOW (ref 135–145)

## 2023-10-13 LAB — GLUCOSE, CAPILLARY
Glucose-Capillary: 182 mg/dL — ABNORMAL HIGH (ref 70–99)
Glucose-Capillary: 146 mg/dL — ABNORMAL HIGH (ref 70–99)
Glucose-Capillary: 59 mg/dL — ABNORMAL LOW (ref 70–99)
Glucose-Capillary: 54 mg/dL — ABNORMAL LOW (ref 70–99)
Glucose-Capillary: 125 mg/dL — ABNORMAL HIGH (ref 70–99)
Glucose-Capillary: 130 mg/dL — ABNORMAL HIGH (ref 70–99)

## 2023-10-13 MED ORDER — TORSEMIDE 20 MG PO TABS
20.0000 mg | ORAL_TABLET | Freq: Every day | ORAL | Status: DC
  Administered 2023-10-14 – 2023-10-15 (×2): 20 mg via ORAL
  Filled 2023-10-13 (×2): qty 1

## 2023-10-13 MED ORDER — DEXTROSE 50 % IV SOLN
INTRAVENOUS | Status: AC
  Administered 2023-10-13: 50 mL via INTRAVENOUS
  Filled 2023-10-13: qty 50

## 2023-10-13 MED ORDER — DEXTROSE 50 % IV SOLN
50.0000 mL | Freq: Once | INTRAVENOUS | Status: AC
Start: 2023-10-13 — End: 2023-10-13

## 2023-10-13 MED ORDER — SPIRONOLACTONE 12.5 MG HALF TABLET
12.5000 mg | ORAL_TABLET | Freq: Every day | ORAL | Status: DC
  Administered 2023-10-14: 12.5 mg via ORAL
  Filled 2023-10-13 (×2): qty 1

## 2023-10-13 MED ORDER — TORSEMIDE 20 MG PO TABS: 10.0000 mg | ORAL_TABLET | Freq: Every day | ORAL | Status: DC

## 2023-10-13 NOTE — Progress Notes (Signed)
 Progress Note   Patient: Alexis Farmer ZOX:[id number] DOB: Dec 09, 1940 DOA: 09/29/2023     13 DOS: the patient was seen and examined on 10/13/2023      Brief hospital course:   "Leyna Easler is a 83 y.o. female with medical history significant for monoclonal B-cell lymphoma, stage IIIb CKD, DM, HTN, HFpEF, chronic pain, obesity, immobility , hospitalized in February with a UTI, and in November with RSV who presents by EMS with a 2-week history of shortness of breath.  She states since her RSV infection her breathing never got quite right however over the past few days she has had worsening shortness of breath, with orthopnea and lower extremity edema. ED course and data review: SBP in the 200s HB at 11.6 and platelets 670. Creatinine slightly above baseline at 1.83 Glucose 221 EKG, personally viewed and interpreted showing sinus at 71 with LVH and no acute ST-T wave changes. Chest x-ray with findings suggestive of CHF Patient given IV Lasix  60 mg Hospitalist consulted for admission. "     Further hospital course and management as outlined below.       Assessment and Plan:   * Acute on chronic heart failure with preserved ejection fraction (HFpEF) (HCC) Hypertensive urgency Elevated troponin Myocardial injury Elevated troponin likely related to demand ischemia from CHF and elevated BP  Patient denies having any chest pain and shortness of breath has improved.   Weaned from 4 >> 2 L/min O2 >> room air --Cardiology, AHF team following, appreciate management IV Lasix  discontinued Continue torsemide  as initiated by cardiology Currently off Entresto  given renal function --Imdur  stopped due to headaches --Wean off oxygen as tolerated --CHF GDMT & HTN meds as below Continue input and output monitoring as well as daily weight --Right heart cath planned once volume status optimized   Hypertensive Urgency -- BP's severely elevated with systolic 200 Uncontrolled and may be  rebound since patient was on clonidine  but has not been resumed   Hypotension -- 4/20 low BP's after escalation of meds past few days --Cardiology following Continue Coreg  and spironolactone  --Hydralazine  resumed 50 mg TID   Uncontrolled type 2 diabetes mellitus with hyperglycemia, with long-term current with renal complications A1c 9.7% Patient on NPH and regular insulin  at home --Increase Semglee  15 >> 20 units --3 units Novolog  TID WC + moderate sliding scale --Carb modified diet   AKI superimposed on CKD stage 3b  Creatinine 1.88 up from baseline of 1.4 Monitor for worsening renal function in view of IV diuretic therapy --Hold spironolactone  and Avapro  Continue to monitor renal function Currently on Lasix  drip   Obesity, Class III, BMI 40-49.9 (morbid obesity) (HCC) Complicating factor to overall prognosis and care Lifestyle modification and exercise has been discussed with patient in detail    Family Communication: Daughter and granddaughter    Disposition: Status is: Inpatient Remains inpatient appropriate because: Awaiting rehab placement    Planned Discharge Destination: SNF recommended     Time spent: 39 minutes   Subjective:  Pain level improving Denies nausea vomiting abdominal pain no chest pain Physical Exam:   General exam: awake & alert, no acute distress, obese HEENT: moist mucus membranes, hearing grossly normal  Respiratory system: CTAB, no wheezes, normal respiratory effort. Cardiovascular system: normal S1/S2, RRR, severe 2-3+ BLE pitting edema is slowly improving with notable skin wrinkling, facial and hand edema has significantly improved.   Gastrointestinal system: soft, NT, ND Central nervous system: A&O x3. no gross focal neurologic deficits, normal speech Psychiatry: normal mood,  congruent affect, judgement and insight appear normal     Data Reviewed:    Latest Ref Rng & Units 10/12/2023    6:01 AM 10/11/2023    5:42 AM 10/10/2023    1:57  PM  CBC  WBC 4.0 - 10.5 K/uL 7.7  8.0    Hemoglobin 12.0 - 15.0 g/dL 16.1  09.6  04.5   Hematocrit 36.0 - 46.0 % 36.2  35.7  39.0   Platelets 150 - 400 K/uL 632  641         Latest Ref Rng & Units 10/13/2023    6:05 AM 10/12/2023    6:01 AM 10/11/2023    5:42 AM  BMP  Glucose 70 - 99 mg/dL 409  811  914   BUN 8 - 23 mg/dL 38  38  36   Creatinine 0.44 - 1.00 mg/dL 7.82  9.56  2.13   Sodium 135 - 145 mmol/L 130  131  130   Potassium 3.5 - 5.1 mmol/L 3.5  3.6  3.8   Chloride 98 - 111 mmol/L 93  92  86   CO2 22 - 32 mmol/L 32  29  34   Calcium  8.9 - 10.3 mg/dL 8.3  8.6  8.5     Vitals:   10/13/23 0836 10/13/23 0929 10/13/23 1236 10/13/23 1717  BP: (!) 134/43  (!) 140/45 (!) 116/44  Pulse: (!) 58 62 (!) 56 (!) 56  Resp: 18  18 16   Temp: 98.4 F (36.9 C)  97.7 F (36.5 C) (!) 97.5 F (36.4 C)  TempSrc: Oral  Oral Axillary  SpO2: 96%  97% 90%  Weight:      Height:        Author: Ezzard Holms, MD 10/13/2023 5:53 PM  For on call review [email or web address].

## 2023-10-13 NOTE — Progress Notes (Addendum)
 Hypoglycemic Event  CBG: 59  Treatment: 8 oz juice/soda  Symptoms: None  Follow-up CBG: Time:1809 CBG Result:54  Possible Reasons for Event: Medication regimen:    Comments/MD notified:Dr. Djan Requested D50 IV push  1859: CBG 125 after 50ml dextrose  50% solution given.  Nakesha Ebrahim N Caylah Plouff

## 2023-10-13 NOTE — Progress Notes (Signed)
 Rounding Note    Patient Name: Alexis Farmer Date of Encounter: 10/13/2023  [facility] Cardiologist: Belva Boyden, MD   Subjective   Patient seen this morning on a.m. rounds.  She is sitting on the side of the bed eating breakfast.  Continues to complain of the muscle aching's to her abdomen and bilateral lower extremities.  No overnight events have been recorded.  Inpatient Medications    Scheduled Meds:  aspirin  EC  81 mg Oral Once per day on Monday Thursday   carvedilol   6.25 mg Oral BID WC   enoxaparin  (LOVENOX ) injection  40 mg Subcutaneous Q24H   hydrALAZINE   50 mg Oral Q8H   insulin  aspart  0-15 Units Subcutaneous TID WC   insulin  aspart  3 Units Subcutaneous TID WC   insulin  glargine-yfgn  20 Units Subcutaneous Daily   morphine   15 mg Oral Q12H   nystatin  cream   Topical BID   polyethylene glycol  17 g Oral Daily   rosuvastatin   20 mg Oral Daily   senna-docusate  1 tablet Oral BID   sodium chloride  flush  3 mL Intravenous Q12H   spironolactone   25 mg Oral Daily   [START ON 10/14/2023] torsemide   10 mg Oral Daily   Continuous Infusions:  PRN Meds: acetaminophen , benzonatate , bisacodyl , HYDROcodone -acetaminophen , hydrocortisone , ipratropium-albuterol , LORazepam , ondansetron  **OR** ondansetron  (ZOFRAN ) IV, mouth rinse, sodium chloride  flush, sodium phosphate , traZODone    Vital Signs    Vitals:   10/13/23 0053 10/13/23 0424 10/13/23 0836 10/13/23 0929  BP: (!) 128/42 (!) 136/38 (!) 134/43   Pulse: 66 (!) 58 (!) 58 62  Resp: 16 18 18    Temp: 98.4 F (36.9 C) 98.8 F (37.1 C) 98.4 F (36.9 C)   TempSrc:  Oral Oral   SpO2: 96% 95% 96%   Weight:      Height:        Intake/Output Summary (Last 24 hours) at 10/13/2023 1032 Last data filed at 10/13/2023 1610 Gross per 24 hour  Intake 0 ml  Output 300 ml  Net -300 ml      10/12/2023    5:00 AM 10/11/2023    5:00 AM 10/10/2023    5:00 AM  Last 3 Weights  Weight (lbs) 237 lb 14 oz 232 lb 12.9  oz 238 lb 15.7 oz  Weight (kg) 107.9 kg 105.6 kg 108.4 kg      Telemetry    Sinus bradycardia to sinus rhythm rates of 50s to 60s with first-degree AV block and unifocal PVCs on occasion- Personally Reviewed  ECG    No new tracings- Personally Reviewed  Physical Exam   GEN: No acute distress.   Neck: No JVD Cardiac: RRR, no murmurs, rubs, or gallops.  Respiratory: Clear with diminished bases to auscultation bilaterally.  Respirations are unlabored at rest on room air GI: Soft, nontender, non-distended  MS: No edema; No deformity. Neuro:  Nonfocal  Psych: Normal affect   Labs    High Sensitivity Troponin:   Recent Labs  Lab 09/29/23 1836  TROPONINIHS 31*     Chemistry Recent Labs  Lab 10/07/23 1538 10/08/23 0447 10/09/23 9604 10/09/23 1317 10/10/23 0304 10/11/23 0542 10/12/23 0601 10/13/23 0605  NA 130*   < > 132* 132*   < > 130* 131* 130*  K 3.3*   < > 3.5 3.5   < > 3.8 3.6 3.5  CL 87*   < > 90* 88*   < > 86* 92* 93*  CO2 32   < >  34* 31   < > 34* 29 32  GLUCOSE 288*   < > 198* 164*   < > 274* 229* 166*  BUN 26*   < > 28* 27*   < > 36* 38* 38*  CREATININE 1.86*   < > 2.02* 2.20*   < > 2.20* 2.27* 2.55*  CALCIUM  8.1*   < > 8.6* 8.9   < > 8.5* 8.6* 8.3*  MG  --   --  1.7  --   --   --   --   --   PROT  --   --   --  6.0*  --   --   --   --   ALBUMIN 2.4*  --   --  2.5*  --   --   --   --   AST  --   --   --  47*  --   --   --   --   ALT  --   --   --  24  --   --   --   --   ALKPHOS  --   --   --  45  --   --   --   --   BILITOT  --   --   --  0.6  --   --   --   --   GFRNONAA 26*   < > 24* 22*   < > 22* 21* 18*  ANIONGAP 11   < > 8 13   < > 10 10 5    < > = values in this interval not displayed.    Lipids No results for input(s): "CHOL", "TRIG", "HDL", "LABVLDL", "LDLCALC", "CHOLHDL" in the last 168 hours.  Hematology Recent Labs  Lab 10/10/23 0304 10/10/23 1356 10/10/23 1357 10/11/23 0542 10/12/23 0601  WBC 9.3  --   --  8.0 7.7  RBC 3.95  --   --   3.96 4.15  HGB 11.4*   < > 13.3 11.4* 11.9*  HCT 35.2*   < > 39.0 35.7* 36.2  MCV 89.1  --   --  90.2 87.2  MCH 28.9  --   --  28.8 28.7  MCHC 32.4  --   --  31.9 32.9  RDW 14.2  --   --  14.1 13.9  PLT 650*  --   --  641* 632*   < > = values in this interval not displayed.   Thyroid  No results for input(s): "TSH", "FREET4" in the last 168 hours.  BNP Recent Labs  Lab 10/09/23 1317  BNP 223.7*    DDimer No results for input(s): "DDIMER" in the last 168 hours.   Radiology    No results found.  Cardiac Studies   RHC 10/10/23 Findings:   RA = 2 RV = 45/3 PA = 42/10 (17) PCW = 1 Fick cardiac output/index = 4.2/2.0 Thermo CO/CI = 4.4/2.1  PVR = 3.9 WU Ao sat = 90% PA sat = 50%, 55% PAPi = 11   Assessment: 1. Mild PAH 2. Very low filling pressures after marked diuresis 3. Moderately reduced CO in setting of hypovolemia   Plan/Discussion:    Will give some fluid. Back. Hold diuretics.    2D echo 09/30/2023 1. Left ventricular ejection fraction, by estimation, is 60 to 65%. The  left ventricle has normal function. The left ventricle has no regional  wall motion abnormalities. There is moderate left ventricular hypertrophy.  Left  ventricular diastolic  parameters are consistent with Grade I diastolic dysfunction (impaired  relaxation). Elevated left atrial pressure.   2. Right ventricular systolic function is normal. The right ventricular  size is normal. There is severely elevated pulmonary artery systolic  pressure. The estimated right ventricular systolic pressure is 80.0 mmHg.   3. Right atrial size was mildly dilated.   4. The mitral valve is normal in structure. Trivial mitral valve  regurgitation. No evidence of mitral stenosis.   5. The aortic valve is tricuspid. Aortic valve regurgitation is not  visualized. No aortic stenosis is present.   6. The inferior vena cava is dilated in size with <50% respiratory  variability, suggesting right atrial pressure  of 15 mmHg.   Patient Profile     83 y.o. female with a past medical history of arthritis, anxiety, primary hypertension, venous insufficiency/chronic lower extremity edema, mixed hyperlipidemia, uncontrolled type 2 diabetes, CKD stage IIIb, morbid obesity, chronic diastolic heart failure, and peripheral arterial disease who is being seen and evaluated for acute on chronic diastolic congestive heart failure.  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

## 2023-10-13 NOTE — Progress Notes (Signed)
 Occupational Therapy Treatment Patient Details Name: Alexis Farmer MRN: [id number] DOB: [birth year] Today's Date: 10/13/2023   History of present illness Alexis Farmer is an 83 y.o. female with medical history significant for monoclonal B-cell lymphoma, stage IIIb CKD, DM, HTN, HFpEF, chronic pain, obesity, immobility, hospitalized in February with a UTI, and in November with RSV who presents by EMS with a 2-week history of shortness of breath with orthopnea and lower extremity edema. MD assessment includes: acute on chronic heart failure, hypertensive urgency, elevated troponin likely demand ischemia, myocardial injury, hypotension, uncontrolled DM, and AKI on CKD 3b.   OT comments  Pt seen for OT treatment on this date. Upon arrival to room pt lying in supine with family member at beside, encouraging pt to work with therapy. Pt agreed to participate in bath if she could sit and lay back down as needed. Pt requires single UE support to remain upright while seated on the EOB. Pt tolerated sitting up on the EOB for ~83mins, able to assist some with UE bathing. Pt returned to bed level to thoroughly clean her LB. MAXA for UB/LB bathing, with difficulty getting pt to participate. Pt required MAXA +2 to boost her up in bed. Pt left with all needs in reach. Pt making progress toward goals, will continue to follow POC. Discharge recommendation remains appropriate.        If plan is discharge home, recommend the following:  A lot of help with walking and/or transfers;A lot of help with bathing/dressing/bathroom;Assistance with cooking/housework;Assist for transportation;Help with stairs or ramp for entrance   Equipment Recommendations  None recommended by OT    Recommendations for Other Services      Precautions / Restrictions Precautions Precautions: Fall Recall of Precautions/Restrictions: Intact Restrictions Weight Bearing Restrictions Per Provider Order: No       Mobility Bed  Mobility Overal bed mobility: Needs Assistance Bed Mobility: Supine to Sit, Sit to Supine     Supine to sit: Min assist Sit to supine: Min assist   General bed mobility comments: LE management, MAXA +2 boost up in bed    Transfers                   General transfer comment: Pt refuses OOB mobility on this date     Balance Overall balance assessment: Needs assistance Sitting-balance support: Single extremity supported, Feet supported Sitting balance-Leahy Scale: Fair Sitting balance - Comments: Hard to assess pt uneager to participate                                   ADL either performed or assessed with clinical judgement   ADL Overall ADL's : Needs assistance/impaired Eating/Feeding: Set up;Bed level       Upper Body Bathing: Maximal assistance;Sitting Upper Body Bathing Details (indicate cue type and reason): Seated on EOB Lower Body Bathing: Maximal assistance Lower Body Bathing Details (indicate cue type and reason): retuned to bed level Upper Body Dressing : Minimal assistance;Sitting Upper Body Dressing Details (indicate cue type and reason): Don fresh gown                   General ADL Comments: UB/LB bathing seated on EOB for part of bathing, returned to bed level to get private areas    Extremity/Trunk Assessment              Vision       Perception  Praxis     Communication Communication Communication: No apparent difficulties Factors Affecting Communication: Hearing impaired   Cognition Arousal: Alert Behavior During Therapy: WFL for tasks assessed/performed Cognition: No apparent impairments                               Following commands: Intact        Cueing   Cueing Techniques: Verbal cues  Exercises Exercises: Other exercises Other Exercises Other Exercises: Edu: Role of OT, benefits of OOB mobility    Shoulder Instructions       General Comments      Pertinent Vitals/  Pain       Pain Assessment Pain Assessment: Faces Faces Pain Scale: Hurts a little bit Pain Location: general body pain Pain Descriptors / Indicators: Aching, Sore Pain Intervention(s): Limited activity within patient's tolerance, Monitored during session  Home Living                                          Prior Functioning/Environment              Frequency  Min 2X/week        Progress Toward Goals  OT Goals(current goals can now be found in the care plan section)  Progress towards OT goals: Progressing toward goals  Acute Rehab OT Goals Patient Stated Goal: to go to rehab and get stronger OT Goal Formulation: With patient Time For Goal Achievement: 10/17/23 Potential to Achieve Goals: Fair ADL Goals Pt Will Perform Grooming: with modified independence;standing Pt Will Perform Lower Body Dressing: with modified independence;sit to/from stand Pt Will Transfer to Toilet: with modified independence;ambulating Pt Will Perform Toileting - Clothing Manipulation and hygiene: with modified independence;sit to/from stand  Plan      Co-evaluation                 AM-PAC OT "6 Clicks" Daily Activity     Outcome Measure   Help from another person eating meals?: None Help from another person taking care of personal grooming?: A Little Help from another person toileting, which includes using toliet, bedpan, or urinal?: A Little Help from another person bathing (including washing, rinsing, drying)?: A Little Help from another person to put on and taking off regular upper body clothing?: A Little Help from another person to put on and taking off regular lower body clothing?: A Little 6 Click Score: 19    End of Session Equipment Utilized During Treatment: Rolling walker (2 wheels)  OT Visit Diagnosis: Unsteadiness on feet (R26.81);Repeated falls (R29.6);Muscle weakness (generalized) (M62.81)   Activity Tolerance Patient tolerated treatment well    Patient Left in bed;with call bell/phone within reach;with bed alarm set   Nurse Communication Mobility status        Time: 1610-9604 OT Time Calculation (min): 27 min  Charges: OT General Charges $OT Visit: 1 Visit OT Treatments $Self Care/Home Management : 8-22 mins  Rosaria Common M.S. OTR/L  10/13/23, 4:45 PM

## 2023-10-13 NOTE — Plan of Care (Signed)
  Problem: Education: Goal: Individualized Educational Video(s) Outcome: Progressing   Problem: Coping: Goal: Ability to adjust to condition or change in health will improve Outcome: Progressing   Problem: Fluid Volume: Goal: Ability to maintain a balanced intake and output will improve Outcome: Progressing   Problem: Health Behavior/Discharge Planning: Goal: Ability to identify and utilize available resources and services will improve Outcome: Progressing Goal: Ability to manage health-related needs will improve Outcome: Progressing   Problem: Metabolic: Goal: Ability to maintain appropriate glucose levels will improve Outcome: Progressing   Problem: Nutritional: Goal: Maintenance of adequate nutrition will improve Outcome: Progressing Goal: Progress toward achieving an optimal weight will improve Outcome: Progressing   Problem: Skin Integrity: Goal: Risk for impaired skin integrity will decrease Outcome: Progressing   Problem: Tissue Perfusion: Goal: Adequacy of tissue perfusion will improve Outcome: Progressing   Problem: Education: Goal: Knowledge of General Education information will improve Description: Including pain rating scale, medication(s)/side effects and non-pharmacologic comfort measures Outcome: Progressing   Problem: Health Behavior/Discharge Planning: Goal: Ability to manage health-related needs will improve Outcome: Progressing   Problem: Clinical Measurements: Goal: Ability to maintain clinical measurements within normal limits will improve Outcome: Progressing Goal: Will remain free from infection Outcome: Progressing Goal: Diagnostic test results will improve Outcome: Progressing Goal: Respiratory complications will improve Outcome: Progressing Goal: Cardiovascular complication will be avoided Outcome: Progressing   Problem: Activity: Goal: Risk for activity intolerance will decrease Outcome: Progressing   Problem: Nutrition: Goal:  Adequate nutrition will be maintained Outcome: Progressing   Problem: Coping: Goal: Level of anxiety will decrease Outcome: Progressing   Problem: Elimination: Goal: Will not experience complications related to bowel motility Outcome: Progressing Goal: Will not experience complications related to urinary retention Outcome: Progressing   Problem: Pain Managment: Goal: General experience of comfort will improve and/or be controlled Outcome: Progressing   Problem: Safety: Goal: Ability to remain free from injury will improve Outcome: Progressing   Problem: Skin Integrity: Goal: Risk for impaired skin integrity will decrease Outcome: Progressing   Problem: Education: Goal: Ability to demonstrate management of disease process will improve Outcome: Progressing Goal: Ability to verbalize understanding of medication therapies will improve Outcome: Progressing Goal: Individualized Educational Video(s) Outcome: Progressing   Problem: Activity: Goal: Capacity to carry out activities will improve Outcome: Progressing   Problem: Cardiac: Goal: Ability to achieve and maintain adequate cardiopulmonary perfusion will improve Outcome: Progressing   Problem: Education: Goal: Understanding of CV disease, CV risk reduction, and recovery process will improve Outcome: Progressing Goal: Individualized Educational Video(s) Outcome: Progressing   Problem: Activity: Goal: Ability to return to baseline activity level will improve Outcome: Progressing   Problem: Cardiovascular: Goal: Ability to achieve and maintain adequate cardiovascular perfusion will improve Outcome: Progressing Goal: Vascular access site(s) Level 0-1 will be maintained Outcome: Progressing   Problem: Health Behavior/Discharge Planning: Goal: Ability to safely manage health-related needs after discharge will improve Outcome: Progressing

## 2023-10-13 NOTE — Plan of Care (Signed)

## 2023-10-14 ENCOUNTER — Encounter: Admitting: Family

## 2023-10-14 DIAGNOSIS — I5033 Acute on chronic diastolic (congestive) heart failure: Secondary | ICD-10-CM | POA: Diagnosis not present

## 2023-10-14 LAB — GLUCOSE, CAPILLARY
Glucose-Capillary: 181 mg/dL — ABNORMAL HIGH (ref 70–99)
Glucose-Capillary: 214 mg/dL — ABNORMAL HIGH (ref 70–99)
Glucose-Capillary: 195 mg/dL — ABNORMAL HIGH (ref 70–99)
Glucose-Capillary: 216 mg/dL — ABNORMAL HIGH (ref 70–99)

## 2023-10-14 LAB — BASIC METABOLIC PANEL WITH GFR
Anion gap: 7 (ref 5–15)
BUN: 40 mg/dL — ABNORMAL HIGH (ref 8–23)
CO2: 33 mmol/L — ABNORMAL HIGH (ref 22–32)
Calcium: 8.6 mg/dL — ABNORMAL LOW (ref 8.9–10.3)
Chloride: 94 mmol/L — ABNORMAL LOW (ref 98–111)
Creatinine, Ser: 2.21 mg/dL — ABNORMAL HIGH (ref 0.44–1.00)
GFR, Estimated: 21 mL/min — ABNORMAL LOW (ref >60)
Glucose, Bld: 187 mg/dL — ABNORMAL HIGH (ref 70–99)
Potassium: 4.1 mmol/L (ref 3.5–5.1)
Sodium: 134 mmol/L — ABNORMAL LOW (ref 135–145)

## 2023-10-14 MED ORDER — HYDRALAZINE HCL 50 MG PO TABS
50.0000 mg | ORAL_TABLET | Freq: Once | ORAL | Status: AC
  Administered 2023-10-14: 50 mg via ORAL
  Filled 2023-10-14: qty 1

## 2023-10-14 MED ORDER — SPIRONOLACTONE 25 MG PO TABS
25.0000 mg | ORAL_TABLET | Freq: Every day | ORAL | Status: DC
  Administered 2023-10-15: 25 mg via ORAL
  Filled 2023-10-14: qty 1

## 2023-10-14 MED ORDER — HYDRALAZINE HCL 50 MG PO TABS
75.0000 mg | ORAL_TABLET | Freq: Three times a day (TID) | ORAL | Status: DC
  Administered 2023-10-14 – 2023-10-15 (×3): 75 mg via ORAL
  Filled 2023-10-14 (×3): qty 1

## 2023-10-14 NOTE — Progress Notes (Signed)
 Progress Note   Patient: Alexis Farmer ZOX:[id number] DOB: 1941/04/14 DOA: 09/29/2023     14 DOS: the patient was seen and examined on 10/14/2023    Brief hospital course:   "Alexis Farmer is a 83 y.o. female with medical history significant for monoclonal B-cell lymphoma, stage IIIb CKD, DM, HTN, HFpEF, chronic pain, obesity, immobility , hospitalized in February with a UTI, and in November with RSV who presents by EMS with a 2-week history of shortness of breath.  She states since her RSV infection her breathing never got quite right however over the past few days she has had worsening shortness of breath, with orthopnea and lower extremity edema. ED course and data review: SBP in the 200s HB at 11.6 and platelets 670. Creatinine slightly above baseline at 1.83 Glucose 221 EKG, personally viewed and interpreted showing sinus at 71 with LVH and no acute ST-T wave changes. Chest x-ray with findings suggestive of CHF Patient given IV Lasix  60 mg Hospitalist consulted for admission. "     Further hospital course and management as outlined below.       Assessment and Plan:   * Acute on chronic heart failure with preserved ejection fraction (HFpEF) (HCC) Hypertensive urgency Elevated troponin Myocardial injury Elevated troponin likely related to demand ischemia from CHF and elevated BP  Patient denies having any chest pain and shortness of breath has improved.   Currently on room air after oxygen have been weaned off IV Lasix  discontinued Continue torsemide  as initiated by cardiology Currently off Entresto  given renal function Continue spironolactone  and carvedilol  Continue other GDMT as recommended by cardiology Continue input and output monitoring as well as daily weight Right heart cath showed low filling pressures and mild pulmonary arterial hypertension   Hypertensive Urgency -- BP's severely elevated with systolic 200 on presentation-improved Continue heart failure  medication as above     Uncontrolled type 2 diabetes mellitus with hyperglycemia, with long-term current with renal complications A1c 9.7% Continue long-acting insulin  as well as sliding scale Monitor glucose closely   AKI superimposed on CKD stage 3b  Creatinine 1.88 up from baseline of 1.4 Renal function have stabilized now Avoid nephrotoxic agent Renally dose all drugs   Obesity, Class III, BMI 40-49.9 (morbid obesity) (HCC) Complicating factor to overall prognosis and care Lifestyle modification and exercise has been discussed with patient in detail   Family Communication: Daughter and granddaughter    Disposition: Status is: Inpatient Remains inpatient appropriate because: Awaiting rehab placement    Planned Discharge Destination: SNF recommended     Time spent: 39 minutes   Subjective:  Patient seen and examined at bedside this morning She denied any acute overnight event Blood pressure was slightly elevated overnight for which hydralazine  have been adjusted upwards as well as the dose of spironolactone   Physical Exam:   General exam: awake & alert, no acute distress, obese HEENT: moist mucus membranes, hearing grossly normal  Respiratory system: CTAB, no wheezes, normal respiratory effort. Cardiovascular system: normal S1/S2, RRR, severe 2-3+ BLE pitting edema is slowly improving with notable skin wrinkling, facial and hand edema has significantly improved.   Gastrointestinal system: soft, NT, ND Central nervous system: A&O x3. no gross focal neurologic deficits, normal speech Psychiatry: normal mood, congruent affect, judgement and insight appear normal     Data Reviewed:    Latest Ref Rng & Units 10/14/2023    4:12 AM 10/13/2023    6:05 AM 10/12/2023    6:01 AM  BMP  Glucose 70 -  99 mg/dL 161  096  045   BUN 8 - 23 mg/dL 40  38  38   Creatinine 0.44 - 1.00 mg/dL 4.09  8.11  9.14   Sodium 135 - 145 mmol/L 134  130  131   Potassium 3.5 - 5.1 mmol/L 4.1  3.5   3.6   Chloride 98 - 111 mmol/L 94  93  92   CO2 22 - 32 mmol/L 33  32  29   Calcium  8.9 - 10.3 mg/dL 8.6  8.3  8.6     Vitals:   10/14/23 1156 10/14/23 1522 10/14/23 1719 10/14/23 1809  BP: (!) 145/44 (!) 145/44 (!) 168/44 (!) 168/44  Pulse: 66  69 69  Resp:   16   Temp: 97.9 F (36.6 C)  98.7 F (37.1 C)   TempSrc: Oral  Oral   SpO2: 100%  99%   Weight:      Height:          Latest Ref Rng & Units 10/12/2023    6:01 AM 10/11/2023    5:42 AM 10/10/2023    1:57 PM  CBC  WBC 4.0 - 10.5 K/uL 7.7  8.0    Hemoglobin 12.0 - 15.0 g/dL 78.2  95.6  21.3   Hematocrit 36.0 - 46.0 % 36.2  35.7  39.0   Platelets 150 - 400 K/uL 632  641       Author: Ezzard Holms, MD 10/14/2023 6:33 PM  For on call review [email or web address].

## 2023-10-14 NOTE — Inpatient Diabetes Management (Signed)
 Inpatient Diabetes Program Recommendations  AACE/ADA: New Consensus Statement on Inpatient Glycemic Control (2015)  Target Ranges:  Prepandial:   less than 140 mg/dL      Peak postprandial:   less than 180 mg/dL (1-2 hours)      Critically ill patients:  140 - 180 mg/dL   Lab Results  Component Value Date   GLUCAP 181 (H) 10/14/2023   HGBA1C 9.7 08/01/2023    Review of Glycemic Control  Latest Reference Range & Units 10/13/23 08:33 10/13/23 12:34 10/13/23 17:16 10/13/23 18:09 10/13/23 18:59 10/13/23 19:25 10/14/23 08:08  Glucose-Capillary 70 - 99 mg/dL 161 (H) 096 (H) 59 (L) 54 (L) 125 (H) 130 (H) 181 (H)  (H): Data is abnormally high (L): Data is abnormally low Diabetes history: Type 2 DM Outpatient Diabetes medications: NPH 20  units QA/15 units QPM, Novolog  0-9 units TID Current orders for Inpatient glycemic control: Novolog  0-15 units TID, Semglee  20 units every day, Novolog  3 units TID  Inpatient Diabetes Program Recommendations:    Noted hypoglycemia following insulin  for meal coverage. Of note, documentation of only "bites" may have contributed. Please note, to give only when patient consumes >50% of meal and administered within one hour of previous CBG.   Thanks, Marjo Sievert, MSN, RNC-OB Diabetes Coordinator [phone] (8a-5p)

## 2023-10-14 NOTE — Plan of Care (Signed)
  Problem: Education: Goal: Individualized Educational Video(s) Outcome: Progressing   Problem: Coping: Goal: Ability to adjust to condition or change in health will improve Outcome: Progressing   Problem: Fluid Volume: Goal: Ability to maintain a balanced intake and output will improve Outcome: Progressing   Problem: Health Behavior/Discharge Planning: Goal: Ability to identify and utilize available resources and services will improve Outcome: Progressing Goal: Ability to manage health-related needs will improve Outcome: Progressing   Problem: Metabolic: Goal: Ability to maintain appropriate glucose levels will improve Outcome: Progressing   Problem: Nutritional: Goal: Maintenance of adequate nutrition will improve Outcome: Progressing Goal: Progress toward achieving an optimal weight will improve Outcome: Progressing   Problem: Skin Integrity: Goal: Risk for impaired skin integrity will decrease Outcome: Progressing   Problem: Tissue Perfusion: Goal: Adequacy of tissue perfusion will improve Outcome: Progressing   Problem: Education: Goal: Knowledge of General Education information will improve Description: Including pain rating scale, medication(s)/side effects and non-pharmacologic comfort measures Outcome: Progressing   Problem: Health Behavior/Discharge Planning: Goal: Ability to manage health-related needs will improve Outcome: Progressing   Problem: Clinical Measurements: Goal: Ability to maintain clinical measurements within normal limits will improve Outcome: Progressing Goal: Will remain free from infection Outcome: Progressing Goal: Diagnostic test results will improve Outcome: Progressing Goal: Respiratory complications will improve Outcome: Progressing Goal: Cardiovascular complication will be avoided Outcome: Progressing   Problem: Activity: Goal: Risk for activity intolerance will decrease Outcome: Progressing   Problem: Nutrition: Goal:  Adequate nutrition will be maintained Outcome: Progressing   Problem: Coping: Goal: Level of anxiety will decrease Outcome: Progressing   Problem: Elimination: Goal: Will not experience complications related to bowel motility Outcome: Progressing Goal: Will not experience complications related to urinary retention Outcome: Progressing   Problem: Pain Managment: Goal: General experience of comfort will improve and/or be controlled Outcome: Progressing   Problem: Safety: Goal: Ability to remain free from injury will improve Outcome: Progressing   Problem: Skin Integrity: Goal: Risk for impaired skin integrity will decrease Outcome: Progressing   Problem: Education: Goal: Ability to demonstrate management of disease process will improve Outcome: Progressing Goal: Ability to verbalize understanding of medication therapies will improve Outcome: Progressing Goal: Individualized Educational Video(s) Outcome: Progressing   Problem: Activity: Goal: Capacity to carry out activities will improve Outcome: Progressing   Problem: Cardiac: Goal: Ability to achieve and maintain adequate cardiopulmonary perfusion will improve Outcome: Progressing   Problem: Education: Goal: Understanding of CV disease, CV risk reduction, and recovery process will improve Outcome: Progressing Goal: Individualized Educational Video(s) Outcome: Progressing   Problem: Activity: Goal: Ability to return to baseline activity level will improve Outcome: Progressing   Problem: Cardiovascular: Goal: Ability to achieve and maintain adequate cardiovascular perfusion will improve Outcome: Progressing Goal: Vascular access site(s) Level 0-1 will be maintained Outcome: Progressing   Problem: Health Behavior/Discharge Planning: Goal: Ability to safely manage health-related needs after discharge will improve Outcome: Progressing

## 2023-10-14 NOTE — Plan of Care (Signed)
       CROSS COVER NOTE  NAME: Alexis Farmer MRN: [id number] DOB : Mar 23, 1941    Concern as stated by nurse / staff   Hello, Alexis Farmer has an elevated BP of 198/67 HR 73. She denies pain at this time. She received her scheduled PM oral BP med but it did not help her BP. Is there any PRN medication you would like to give for hypertension? Thank you      Pertinent findings on chart review:    10/14/2023   12:46 AM 10/14/2023   12:17 AM 10/13/2023    7:26 PM  Vitals with BMI  Systolic 192 198 562  Diastolic 70 67 53  Pulse  73 62   Progress note and med list reviewed  Assessment and  Interventions   Assessment:  Uncontrolled hypertension.  Currently on Coreg  6.25 twice daily and hydralazine  50mg  3 times daily  Plan: Give an extra 50 mg hydralazine  tonight for total of 100 mg Primary attending to adjust BP meds in the a.m. based on response X

## 2023-10-14 NOTE — Progress Notes (Signed)
 Patient ID: Shakoya Filter, female   DOB: February 16, 1941, 83 y.o.   MRN: [id number]     Advanced Heart Failure Rounding Note  PCP-Cardiologist: Belva Boyden, MD   Patient Profile   Alexis Farmer is a 83 y.o. female with a PMH of resistant hypertension, chronic HFpEF, obesity, type 2 diabetes, CKD stage IIIb who is seen today for evaluation of acute on chronic diastolic heart failure at the request of Dr Gollan.   Subjective:     BP elevated today but denies dyspnea. She has walked some in her room.  Creatinine lower today at 2.21.   No dyspnea.   Objective:   Weight Range: 105.9 kg Body mass index is 41.36 kg/m.   Vital Signs:   Temp:  [97.5 F (36.4 C)-98.7 F (37.1 C)] 98.4 F (36.9 C) (04/28 0812) Pulse Rate:  [56-73] 64 (04/28 0812) Resp:  [16-18] 18 (04/28 0812) BP: (116-198)/(44-70) 152/57 (04/28 0812) SpO2:  [90 %-100 %] 100 % (04/28 0812) Weight:  [105.9 kg] 105.9 kg (04/28 0500) Last BM Date : 10/13/23  Weight change: Filed Weights   10/11/23 0500 10/12/23 0500 10/14/23 0500  Weight: 105.6 kg 107.9 kg 105.9 kg    Intake/Output:   Intake/Output Summary (Last 24 hours) at 10/14/2023 0846 Last data filed at 10/13/2023 1900 Gross per 24 hour  Intake 840 ml  Output 300 ml  Net 540 ml    Scheduled Meds:  aspirin  EC  81 mg Oral Once per day on Monday Thursday   carvedilol   6.25 mg Oral BID WC   enoxaparin  (LOVENOX ) injection  40 mg Subcutaneous Q24H   hydrALAZINE   75 mg Oral Q8H   insulin  aspart  0-15 Units Subcutaneous TID WC   insulin  aspart  3 Units Subcutaneous TID WC   insulin  glargine-yfgn  20 Units Subcutaneous Daily   morphine   15 mg Oral Q12H   nystatin  cream   Topical BID   polyethylene glycol  17 g Oral Daily   rosuvastatin   20 mg Oral Daily   senna-docusate  1 tablet Oral BID   sodium chloride  flush  3 mL Intravenous Q12H   spironolactone   12.5 mg Oral Daily   torsemide   20 mg Oral Daily   Continuous Infusions: PRN Meds:.acetaminophen ,  benzonatate , bisacodyl , HYDROcodone -acetaminophen , hydrocortisone , ipratropium-albuterol , LORazepam , ondansetron  **OR** ondansetron  (ZOFRAN ) IV, mouth rinse, sodium chloride  flush, sodium phosphate , traZODone    Physical Exam   General: NAD Neck: No JVD, no thyromegaly or thyroid  nodule.  Lungs: Clear to auscultation bilaterally with normal respiratory effort. CV: Nondisplaced PMI.  Heart regular S1/S2, no S3/S4, no murmur.  No peripheral edema.  .  Abdomen: Soft, nontender, no hepatosplenomegaly, no distention.  Skin: Intact without lesions or rashes.  Neurologic: Alert and oriented x 3.  Psych: Normal affect. Extremities: No clubbing or cyanosis.  HEENT: Normal.   Telemetry   Sinus 70s (Personally reviewed)  Assessment/Plan   1. Acute on chronic diastolic heart failure:  - RHC last week after diuresis with low filling pressures and mild pulmonary arterial hypertension.  - She is not volume overloaded on exam, continue torsemide  20 mg daily.  - Continue Spironolactone  25 mg daily  - Continue Coreg  6.25 mg bid  - She did not tolerate SGLT2 inhibitor in the past.  - Off Entresto  with elevated creatinine.    2. Pulmonary hypertension:  - Mild PAH on RHC.    3. 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  4. Type 2 Diabetes:  - long standing. Poorly controlled, recent Hgb A1c 9.7  - on insulin  - management per IM   5. HTN - BP elevated, increase hydralazine  to 75 mg tid.   Mobilize.   She appears to be ready for discharge from my standpoint.  Needs close followup in CHF clinic (next week with APP and should also get BMET later in week).  Meds for home: torsemide  20 daily, hydralazine  75 tid, spironolactone  25 daily, Coreg  6.25 mg bid, ASA 81, Crestor  20.   Length of Stay: 14  [name], MD  10/14/2023, 8:46 AM  Advanced Heart Failure Team Pager [phone] (M-F; 7a - 5p)  Please contact  [facility] for night-coverage after hours (5p -7a ) and weekends on [email or web address]

## 2023-10-14 NOTE — Progress Notes (Signed)
 Notified Dr. Mariella Shore that patient has refused her long acting insulin  this morning and her sliding scale insulin  at lunch and now at dinner. MD acknowledged. No orders given.

## 2023-10-14 NOTE — TOC Progression Note (Signed)
 Transition of Care [facility]) - Progression Note    Patient Details  Name: Alexis Farmer MRN: [id number] Date of Birth: April 28, 1941  [facility]) CM/SW Contact  Baird Bombard, RN Phone Number: 10/14/2023, 4:10 PM  Clinical Narrative:    Spoke with patient's granddaughter regarding therapy's recommendation . She stated she had spoken with patient and she is agreeable to SNF now. [facility] Gibbons was provided bed offers for patient including [facility], [facility] and [facility] rehab. [name] chose [facility]. She was advised patient patient will likely be discharge ready tomorrow. She was also updated about cardiology's recommendation for patient to go to a CHF clinic post discharge. [facility] Gibbons would like a medical update.  MD notified.    Expected Discharge Plan: Home w Home Health Services Barriers to Discharge: Continued Medical Work up  Expected Discharge Plan and Services       Living arrangements for the past 2 months: Single Family Home                           HH Arranged: PT, OT HH Agency: [facility] Date HH Agency Contacted: 10/02/23   Representative spoke with at [facility] Agency: Shaun   Social Determinants of Health (SDOH) Interventions SDOH Screenings   Food Insecurity: No Food Insecurity (10/03/2023)  Recent Concern: Food Insecurity - Food Insecurity Present (10/03/2023)  Housing: Low Risk  (10/03/2023)  Transportation Needs: No Transportation Needs (10/03/2023)  Recent Concern: Transportation Needs - Unmet Transportation Needs (09/23/2023)  Utilities: Not At Risk (09/30/2023)  Alcohol Screen: Low Risk  (11/29/2022)  Depression (PHQ2-9): High Risk (09/23/2023)  Financial Resource Strain: Medium Risk (10/03/2023)  Physical Activity: Inactive (10/03/2023)  Social Connections: Moderately Integrated (09/30/2023)  Stress: Stress Concern Present (11/29/2022)  Tobacco Use: Low Risk  (09/30/2023)    Readmission Risk  Interventions    10/03/2023   12:03 PM  Readmission Risk Prevention Plan  Transportation Screening Complete  PCP or Specialist Appt within 3-5 Days Complete  HRI or Home Care Consult Complete  Social Work Consult for Recovery Care Planning/Counseling Complete  Palliative Care Screening Not Applicable  Medication Review Oceanographer) Complete

## 2023-10-15 DIAGNOSIS — I5033 Acute on chronic diastolic (congestive) heart failure: Secondary | ICD-10-CM | POA: Diagnosis not present

## 2023-10-15 LAB — GLUCOSE, CAPILLARY
Glucose-Capillary: 243 mg/dL — ABNORMAL HIGH (ref 70–99)
Glucose-Capillary: 426 mg/dL — ABNORMAL HIGH (ref 70–99)

## 2023-10-15 LAB — BASIC METABOLIC PANEL WITH GFR
Anion gap: 11 (ref 5–15)
BUN: 37 mg/dL — ABNORMAL HIGH (ref 8–23)
CO2: 29 mmol/L (ref 22–32)
Calcium: 9 mg/dL (ref 8.9–10.3)
Chloride: 93 mmol/L — ABNORMAL LOW (ref 98–111)
Creatinine, Ser: 2.01 mg/dL — ABNORMAL HIGH (ref 0.44–1.00)
GFR, Estimated: 24 mL/min — ABNORMAL LOW (ref >60)
Glucose, Bld: 220 mg/dL — ABNORMAL HIGH (ref 70–99)
Potassium: 4 mmol/L (ref 3.5–5.1)
Sodium: 133 mmol/L — ABNORMAL LOW (ref 135–145)

## 2023-10-15 MED ORDER — HYDRALAZINE HCL 50 MG PO TABS
100.0000 mg | ORAL_TABLET | Freq: Three times a day (TID) | ORAL | Status: DC
  Administered 2023-10-15: 100 mg via ORAL
  Filled 2023-10-15: qty 2

## 2023-10-15 MED ORDER — TORSEMIDE 10 MG PO TABS
20.0000 mg | ORAL_TABLET | Freq: Every day | ORAL | Status: DC
Start: 2023-10-15 — End: 2023-11-15

## 2023-10-15 MED ORDER — HYDRALAZINE HCL 100 MG PO TABS: 100.0000 mg | ORAL_TABLET | Freq: Three times a day (TID) | ORAL | Status: DC

## 2023-10-15 MED ORDER — SENNOSIDES-DOCUSATE SODIUM 8.6-50 MG PO TABS: 1.0000 | ORAL_TABLET | Freq: Two times a day (BID) | ORAL | Status: DC

## 2023-10-15 MED ORDER — TRAZODONE HCL 50 MG PO TABS: 50.0000 mg | ORAL_TABLET | Freq: Every evening | ORAL | Status: DC | PRN

## 2023-10-15 MED ORDER — CARVEDILOL 6.25 MG PO TABS: 6.2500 mg | ORAL_TABLET | Freq: Two times a day (BID) | ORAL | Status: DC

## 2023-10-15 NOTE — Discharge Summary (Signed)
 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 female with medical history significant for monoclonal B-cell lymphoma, stage IIIb CKD, DM, HTN, HFpEF, chronic pain, obesity, immobility , hospitalized in February with a UTI, and in November with RSV who presents by EMS with a 2-week history of shortness of breath.  Upon arrival to the emergency room patient was found with chest x-ray with findings suggestive of CHF Was subsequently admitted for congestive heart failure exacerbation with heart failure cardiology team on board. Was also found to have elevated troponin likely related to demand ischemia from CHF and elevated BP.  Underwent Right heart cath which showed low filling pressures and mild pulmonary arterial hypertension.  Underwent aggressive diuresis with IV Lasix  which was later on switched to oral torsemide  by cardiology. Patient is currently off Entresto  given renal function.  Other GDMT medication as ordered by cardiology as shown in the medication list.  Patient will follow-up with cardiologist as an outpatient.    Consultants: Cardiology Procedures performed: As mentioned above Disposition: Skilled nursing facility Diet recommendation:  Cardiac diet DISCHARGE MEDICATION: Allergies as of 10/15/2023       Reactions   Celexa  [citalopram ]     Diarrhea upset stomach   Jardiance  [empagliflozin ] Other (See Comments)   Reaction not recalled   Norvasc  [amlodipine ]    Leg edema   Tape Other (See Comments)   Leaves the skin "raw" if left on for a period of time- tolerates paper tape   Penicillin V Rash   Penicillin V Potassium Rash   Penicillins Rash   Has patient had a PCN reaction causing immediate rash, facial/tongue/throat swelling, SOB or lightheadedness with hypotension: Yes Has patient had a PCN reaction causing severe rash involving mucus membranes or skin necrosis: No Has patient had a PCN reaction that required hospitalization No Has patient had a PCN reaction occurring within the last 10 years: Yes If all of the above answers are "NO", then may proceed with Cephalosporin use.        Medication List     STOP taking these medications    budesonide  0.5 MG/2ML nebulizer solution Commonly known as: PULMICORT    cloNIDine  0.1 MG tablet Commonly known as: CATAPRES    meclizine  12.5 MG tablet Commonly known as: ANTIVERT    nebivolol  10 MG tablet Commonly known as: BYSTOLIC    valsartan  160 MG tablet Commonly known as: DIOVAN        TAKE these medications    Accu-Chek Guide test strip Generic drug: glucose blood USE AS INSTRUCTED 3 TIMES DAILY   acetaminophen  500 MG tablet Commonly known as: TYLENOL  Take 500 mg by mouth every 6 (six) hours as needed for mild pain or headache.   aspirin  EC 81 MG tablet Take 81 mg by mouth 2 (two) times a week. Swallow whole.   BD Insulin  Syringe U/F 30G X 1/2" 0.5 ML Misc Generic drug: Insulin  Syringe-Needle U-100 USE UP TO 5X PER DAY WITH  INSULIN  2X (NPH) AND 3X (REGULAR)   benzonatate  200 MG capsule Commonly known as: TESSALON  Take 200 mg by mouth 3 (three) times daily as needed for cough.   blood glucose meter kit and supplies Kit Accu chek, Dx code E11.65, check 3 times daily   Blood Pressure Kit 1 Device by Does not apply route daily.   carvedilol  6.25 MG  tablet Commonly known as: COREG  Take 1 tablet (6.25 mg total) by mouth 2 (two) times daily with a meal.   Centrum Silver 50+Women Tabs Take 1 tablet by mouth daily with breakfast.   gabapentin  300 MG capsule Commonly known as: NEURONTIN  Take 300 mg by mouth at bedtime.   hydrALAZINE  100 MG tablet Commonly known as: APRESOLINE  Take 1 tablet (100 mg total) by mouth 3 (three) times daily. TAKE 1 TABLET (100 MG TOTAL) BY MOUTH 4 (FOUR) TIMES DAILY. Must keep upcoming appointment on 10/21/23 for future refills. What changed:  how much to take how to take this when to take this   insulin  regular 100 units/mL injection Commonly known as: NovoLIN R Inject 0-0.08 mLs (0-8 Units total) into the skin 3 (three) times daily before meals.   ipratropium-albuterol  0.5-2.5 (3) MG/3ML Soln Commonly known as: DUONEB Take 3 mLs by nebulization 4 (four) times daily. Take scheduled 4 times daily until wheezing and shortness of breath from RSV improves. Then take as needed.   LORazepam  1 MG tablet Commonly known as: ATIVAN  Take 0.5 tablets (0.5 mg total) by mouth daily as needed for anxiety. Inform pt 0.5 dose on backorder has to cut this in 1/2 dose   MUCUS RELIEF DM PO Take by mouth. Liquid cough syrup   NovoLIN N 100 UNIT/ML injection Generic drug: insulin  NPH Human Inject 15-20 Units into the skin 2 (two) times daily before a meal. Inject 20 units in the morning an 15 units in the evening.   nystatin  powder Commonly known as: MYCOSTATIN /NYSTOP  Apply topically 2 (two) times daily. Under abdomen   polyethylene glycol powder 17 GM/SCOOP powder Commonly known as: GLYCOLAX /MIRALAX  Take 17 g by mouth daily as needed for moderate constipation or severe constipation. Can take up to 2x per day prn. Mix with 8 ounces of liquid   rosuvastatin  20 MG tablet Commonly known as: CRESTOR  TAKE 1 TABLET BY MOUTH EVERY DAY   Scot-Tussin Expectorant 100 MG/5ML liquid Generic drug: guaiFENesin Take 20 mLs  by mouth 3 (three) times daily as needed for cough.   senna-docusate 8.6-50 MG tablet Commonly known as: Senokot-S Take 1 tablet by mouth 2 (two) times daily.   spironolactone  25 MG tablet Commonly known as: ALDACTONE  Take 25 mg by mouth 3 (three) times a week. Monday Wednesday and friday   torsemide  10 MG tablet Commonly known as: DEMADEX  Take 2 tablets (20 mg total) by mouth daily. What changed: how much to take   traZODone  50 MG tablet Commonly known as: DESYREL  Take 1 tablet (50 mg total) by mouth at bedtime as needed for sleep.   VITAMIN C PO Take 1 tablet by mouth daily.        Contact information for follow-up providers     [facility]. Go on 10/17/2023.   Specialty: Cardiology Why: Hospital Follow-Up 10/17/2023 @ 11:30am Please bring all medications to follow-up appointment [facility], [address] Free [employer] partking at the [employer] account planner information: [address] [place]  [phone]  Contact information for after-discharge care     Destination     [facility] Preferred SNF .   Service: Skilled Nursing Contact information: [address] [place]  [phone]                    Discharge Exam: Filed Weights   10/12/23 0500 10/14/23 0500 10/15/23 0500  Weight: 107.9 kg 105.9 kg 102.4 kg   General exam: awake & alert, no acute distress, obese HEENT: moist mucus membranes, hearing grossly normal  Respiratory system: CTAB, no wheezes, normal respiratory effort. Cardiovascular system: normal S1/S2, RRR, severe 2-3+ BLE pitting edema is slowly improving with notable skin wrinkling, facial and hand edema has significantly improved.   Gastrointestinal system: soft, NT, ND Central nervous system: A&O x3. no gross focal neurologic deficits, normal speech Psychiatry:  normal mood, congruent affect, judgement and insight appear normal  Condition at discharge: good  The results of significant diagnostics from this hospitalization (including imaging, microbiology, ancillary and laboratory) are listed below for reference.   Imaging Studies: CARDIAC CATHETERIZATION Result Date: 10/10/2023 Findings: RA = 2 RV = 45/3 PA = 42/10 (17) PCW = 1 Fick cardiac output/index = 4.2/2.0 Thermo CO/CI = 4.4/2.1 PVR = 3.9 WU Ao sat = 90% PA sat = 50%, 55% PAPi = 11 Assessment: 1. Mild PAH 2. Very low filling pressures after marked diuresis 3. Moderately reduced CO in setting of hypovolemia Plan/Discussion: Will give some fluid. Back. Hold diuretics. Jules Oar, MD 2:10 PM  ECHOCARDIOGRAM COMPLETE Result Date: 09/30/2023    ECHOCARDIOGRAM REPORT   Patient Name:   Alexis Farmer Date of Exam: 09/30/2023 Medical Rec #:  [id number]        Height:       63.0 in Accession #:    [id number]       Weight:       288.0 lb Date of Birth:  Jul 05, 1940        BSA:          2.258 m Patient Age:    82 years         BP:           161/61 mmHg Patient Gender: F                HR:           66 bpm. Exam Location:  [facility] Procedure: 2D Echo, Cardiac Doppler and Color Doppler (Both Spectral and Color            Flow Doppler were utilized during procedure). Indications:     CHF  History:         Patient has prior history of Echocardiogram examinations, most                  recent 04/01/2023. CHF, PAD, Signs/Symptoms:Fatigue; Risk                  Factors:Hypertension, Diabetes and Dyslipidemia. CKD.  Sonographer:     Clarke Crouch Referring Phys:  [id number] Lanetta Pion Diagnosing Phys: Sammy Crisp MD  Sonographer Comments: Patient is obese and suboptimal apical window. IMPRESSIONS  1. Left ventricular ejection fraction, by estimation, is 60 to 65%. The left ventricle has normal function. The left ventricle has no regional wall motion abnormalities. There is moderate left ventricular hypertrophy. Left  ventricular diastolic parameters are consistent with Grade I diastolic dysfunction (impaired relaxation). Elevated left atrial pressure.  2. Right ventricular  systolic function is normal. The right ventricular size is normal. There is severely elevated pulmonary artery systolic pressure. The estimated right ventricular systolic pressure is 80.0 mmHg.  3. Right atrial size was mildly dilated.  4. The mitral valve is normal in structure. Trivial mitral valve regurgitation. No evidence of mitral stenosis.  5. The aortic valve is tricuspid. Aortic valve regurgitation is not visualized. No aortic stenosis is present.  6. The inferior vena cava is dilated in size with <50% respiratory variability, suggesting right atrial pressure of 15 mmHg. FINDINGS  Left Ventricle: Left ventricular ejection fraction, by estimation, is 60 to 65%. The left ventricle has normal function. The left ventricle has no regional wall motion abnormalities. The left ventricular internal cavity size was normal in size. There is  moderate left ventricular hypertrophy. Left ventricular diastolic parameters are consistent with Grade I diastolic dysfunction (impaired relaxation). Elevated left atrial pressure. Right Ventricle: The right ventricular size is normal. No increase in right ventricular wall thickness. Right ventricular systolic function is normal. There is severely elevated pulmonary artery systolic pressure. The tricuspid regurgitant velocity is 4.03 m/s, and with an assumed right atrial pressure of 15 mmHg, the estimated right ventricular systolic pressure is 80.0 mmHg. Left Atrium: Left atrial size was normal in size. Right Atrium: Right atrial size was mildly dilated. Pericardium: There is no evidence of pericardial effusion. Mitral Valve: The mitral valve is normal in structure. Trivial mitral valve regurgitation. No evidence of mitral valve stenosis. MV peak gradient, 5.5 mmHg. The mean mitral valve gradient is 2.0 mmHg. Tricuspid  Valve: The tricuspid valve is not well visualized. Tricuspid valve regurgitation is trivial. Aortic Valve: The aortic valve is tricuspid. Aortic valve regurgitation is not visualized. No aortic stenosis is present. Aortic valve mean gradient measures 4.0 mmHg. Aortic valve peak gradient measures 8.3 mmHg. Aortic valve area, by VTI measures 2.57 cm. Pulmonic Valve: The pulmonic valve was not well visualized. Pulmonic valve regurgitation is trivial. No evidence of pulmonic stenosis. Aorta: The aortic root is normal in size and structure. Pulmonary Artery: The pulmonary artery is of normal size. Venous: The inferior vena cava is dilated in size with less than 50% respiratory variability, suggesting right atrial pressure of 15 mmHg. IAS/Shunts: No atrial level shunt detected by color flow Doppler.  LEFT VENTRICLE PLAX 2D LVIDd:         5.00 cm   Diastology LVIDs:         1.20 cm   LV e' medial:    5.03 cm/s LV PW:         1.40 cm   LV E/e' medial:  18.0 LV IVS:        1.60 cm   LV e' lateral:   5.78 cm/s LVOT diam:     1.90 cm   LV E/e' lateral: 15.7 LV SV:         87 LV SV Index:   38 LVOT Area:     2.84 cm  RIGHT VENTRICLE RV Basal diam:  3.35 cm RV Mid diam:    2.90 cm RV S prime:     12.40 cm/s TAPSE (M-mode): 2.4 cm LEFT ATRIUM             Index        RIGHT ATRIUM           Index LA diam:        4.90 cm 2.17 cm/m   RA Area:     21.20 cm LA Vol (  A2C):   52.7 ml 23.34 ml/m  RA Volume:   62.80 ml  27.82 ml/m LA Vol (A4C):   70.9 ml 31.40 ml/m LA Biplane Vol: 62.7 ml 27.77 ml/m  AORTIC VALVE                    PULMONIC VALVE AV Area (Vmax):    2.62 cm     PV Vmax:       0.84 m/s AV Area (Vmean):   2.48 cm     PV Peak grad:  2.8 mmHg AV Area (VTI):     2.57 cm AV Vmax:           144.00 cm/s AV Vmean:          89.100 cm/s AV VTI:            0.338 m AV Peak Grad:      8.3 mmHg AV Mean Grad:      4.0 mmHg LVOT Vmax:         133.00 cm/s LVOT Vmean:        77.800 cm/s LVOT VTI:          0.306 m LVOT/AV VTI ratio:  0.91  AORTA Ao Root diam: 3.20 cm MITRAL VALVE                TRICUSPID VALVE MV Area (PHT): 2.91 cm     TR Peak grad:   65.0 mmHg MV Area VTI:   2.08 cm     TR Vmax:        403.00 cm/s MV Peak grad:  5.5 mmHg MV Mean grad:  2.0 mmHg     SHUNTS MV Vmax:       1.17 m/s     Systemic VTI:  0.31 m MV Vmean:      68.0 cm/s    Systemic Diam: 1.90 cm MV Decel Time: 261 msec MV E velocity: 90.70 cm/s MV A velocity: 106.00 cm/s MV E/A ratio:  0.86 Veryl Gottron End MD Electronically signed by Sammy Crisp MD Signature Date/Time: 09/30/2023/12:41:32 PM    Final    DG Chest Port 1 View Result Date: 09/29/2023 CLINICAL DATA:  sob EXAM: PORTABLE CHEST - 1 VIEW COMPARISON:  08/04/2023. FINDINGS: Cardiac silhouette is prominent. There is pulmonary interstitial prominence with vascular congestion. No focal consolidation. No pneumothorax. Small pleural effusions. Calcified aorta. Thoracic degenerative changes. IMPRESSION: Findings suggest CHF. Electronically Signed   By: Sydell Eva M.D.   On: 09/29/2023 19:25    Microbiology: Results for orders placed or performed during the hospital encounter of 09/29/23  Resp panel by RT-PCR (RSV, Flu A&B, Covid) Anterior Nasal Swab     Status: None   Collection Time: 09/29/23  6:53 PM   Specimen: Anterior Nasal Swab  Result Value Ref Range Status   SARS Coronavirus 2 by RT PCR NEGATIVE NEGATIVE Final    Comment: (NOTE) SARS-CoV-2 target nucleic acids are NOT DETECTED.  The SARS-CoV-2 RNA is generally detectable in upper respiratory specimens during the acute phase of infection. The lowest concentration of SARS-CoV-2 viral copies this assay can detect is 138 copies/mL. A negative result does not preclude SARS-Cov-2 infection and should not be used as the sole basis for treatment or other patient management decisions. A negative result may occur with  improper specimen collection/handling, submission of specimen other than nasopharyngeal swab, presence of viral  mutation(s) within the areas targeted by this assay, and inadequate number of viral copies(<138 copies/mL). A negative result  must be combined with clinical observations, patient history, and epidemiological information. The expected result is Negative.  Fact Sheet for Patients:  BloggerCourse.com  Fact Sheet for Healthcare Providers:  SeriousBroker.it  This test is no t yet approved or cleared by the [place]  FDA and  has been authorized for detection and/or diagnosis of SARS-CoV-2 by FDA under an Emergency Use Authorization (EUA). This EUA will remain  in effect (meaning this test can be used) for the duration of the COVID-19 declaration under Section 564(b)(1) of the Act, 21 U.S.C.section 360bbb-3(b)(1), unless the authorization is terminated  or revoked sooner.       Influenza A by PCR NEGATIVE NEGATIVE Final   Influenza B by PCR NEGATIVE NEGATIVE Final    Comment: (NOTE) The Xpert Xpress SARS-CoV-2/FLU/RSV plus assay is intended as an aid in the diagnosis of influenza from Nasopharyngeal swab specimens and should not be used as a sole basis for treatment. Nasal washings and aspirates are unacceptable for Xpert Xpress SARS-CoV-2/FLU/RSV testing.  Fact Sheet for Patients: BloggerCourse.com  Fact Sheet for Healthcare Providers: SeriousBroker.it  This test is not yet approved or cleared by the [place]  FDA and has been authorized for detection and/or diagnosis of SARS-CoV-2 by FDA under an Emergency Use Authorization (EUA). This EUA will remain in effect (meaning this test can be used) for the duration of the COVID-19 declaration under Section 564(b)(1) of the Act, 21 U.S.C. section 360bbb-3(b)(1), unless the authorization is terminated or revoked.     Resp Syncytial Virus by PCR NEGATIVE NEGATIVE Final    Comment: (NOTE) Fact Sheet for  Patients: BloggerCourse.com  Fact Sheet for Healthcare Providers: SeriousBroker.it  This test is not yet approved or cleared by the [place]  FDA and has been authorized for detection and/or diagnosis of SARS-CoV-2 by FDA under an Emergency Use Authorization (EUA). This EUA will remain in effect (meaning this test can be used) for the duration of the COVID-19 declaration under Section 564(b)(1) of the Act, 21 U.S.C. section 360bbb-3(b)(1), unless the authorization is terminated or revoked.  Performed at [facility], [address] [place], [place] [address]   C Difficile Quick Screen w PCR reflex     Status: None   Collection Time: 10/01/23  5:30 PM   Specimen: STOOL  Result Value Ref Range Status   C Diff antigen NEGATIVE NEGATIVE Final   C Diff toxin NEGATIVE NEGATIVE Final   C Diff interpretation No C. difficile detected.  Final    Comment: Performed at [facility], [address] [place], [place] [address]   *Note: Due to a large number of results and/or encounters for the requested time period, some results have not been displayed. A complete set of results can be found in Results Review.    Labs: CBC: Recent Labs  Lab 10/09/23 0613 10/10/23 0304 10/10/23 1356 10/10/23 1357 10/11/23 0542 10/12/23 0601  WBC 8.1 9.3  --   --  8.0 7.7  NEUTROABS  --  3.8  --   --  2.8 4.3  HGB 11.6* 11.4* 13.3 13.3 11.4* 11.9*  HCT 36.4 35.2* 39.0 39.0 35.7* 36.2  MCV 88.8 89.1  --   --  90.2 87.2  PLT 623* 650*  --   --  641* 632*   Basic Metabolic Panel: Recent Labs  Lab 10/09/23 0613 10/09/23 1317 10/11/23 0542 10/12/23 0601 10/13/23 0605 10/14/23 0412 10/15/23 0556  NA 132*   < > 130* 131* 130* 134* 133*  K 3.5   < >  3.8 3.6 3.5 4.1 4.0  CL 90*   < > 86* 92* 93* 94* 93*  CO2 34*   < > 34* 29 32 33* 29  GLUCOSE 198*   < > 274* 229* 166* 187* 220*  BUN 28*   < > 36* 38* 38* 40* 37*   CREATININE 2.02*   < > 2.20* 2.27* 2.55* 2.21* 2.01*  CALCIUM  8.6*   < > 8.5* 8.6* 8.3* 8.6* 9.0  MG 1.7  --   --   --   --   --   --    < > = values in this interval not displayed.   Liver Function Tests: Recent Labs  Lab 10/09/23 1317  AST 47*  ALT 24  ALKPHOS 45  BILITOT 0.6  PROT 6.0*  ALBUMIN 2.5*   CBG: Recent Labs  Lab 10/14/23 0808 10/14/23 1153 10/14/23 1715 10/14/23 1942 10/15/23 0811  GLUCAP 181* 214* 195* 216* 243*    Discharge time spent:  37 minutes.  Signed: Ezzard Holms, MD [facility] 10/15/2023

## 2023-10-15 NOTE — TOC Transition Note (Addendum)
 Transition of Care [facility]) - Discharge Note   Patient Details  Name: Alexis Farmer MRN: [id number] Date of Birth: 22-Apr-1941  [facility] CM/SW Contact:  Latroya Ng C Giannah Zavadil, RN Phone Number: 10/15/2023, 10:18 AM   Clinical Narrative:    Per facility patient admission confirmed for today via Darrian, Admissions Coordinator Patient assigned room # 703 @ [facility]  and Rehab Report will be called to [phone] Discharge summary sent in HUB.  Nurse, and family notified spoke with her granddaughter, Aleene Amor with patient's granddauthgter, [name] to advised patient will not qualify for EMS transport via [facility]. She was advised patient is able to sit unassisted, walking 3 ft CGA and is Aox4. Patient granddaughter was upset stating patient does not transport by car. RNCM reiterated patient does not qualify for EMS per [facility] guidelines. She will have patient's daughter transport patient to facility. Care team and Darrian, AC at [facility] advised patient will transport via POV and will require max assist.   TOC signing off.         Barriers to Discharge: Continued Medical Work up   Patient Goals and CMS Choice            Discharge Placement                       Discharge Plan and Services Additional resources added to the After Visit Summary for                            [facility] Arranged: PT, OT HH Agency: [facility] Date HH Agency Contacted: 10/02/23   Representative spoke with at [facility] Agency: Shaun  Social Drivers of Health (SDOH) Interventions SDOH Screenings   Food Insecurity: No Food Insecurity (10/03/2023)  Recent Concern: Food Insecurity - Food Insecurity Present (10/03/2023)  Housing: Low Risk  (10/03/2023)  Transportation Needs: No Transportation Needs (10/03/2023)  Recent Concern: Transportation Needs - Unmet Transportation Needs (09/23/2023)  Utilities: Not At Risk (09/30/2023)  Alcohol Screen: Low Risk   (11/29/2022)  Depression (PHQ2-9): High Risk (09/23/2023)  Financial Resource Strain: Medium Risk (10/03/2023)  Physical Activity: Inactive (10/03/2023)  Social Connections: Moderately Integrated (09/30/2023)  Stress: Stress Concern Present (11/29/2022)  Tobacco Use: Low Risk  (09/30/2023)     Readmission Risk Interventions    10/03/2023   12:03 PM  Readmission Risk Prevention Plan  Transportation Screening Complete  PCP or Specialist Appt within 3-5 Days Complete  HRI or Home Care Consult Complete  Social Work Consult for Recovery Care Planning/Counseling Complete  Palliative Care Screening Not Applicable  Medication Review Oceanographer) Complete

## 2023-10-15 NOTE — Plan of Care (Signed)
  Problem: Education: Goal: Individualized Educational Video(s) Outcome: Progressing   Problem: Coping: Goal: Ability to adjust to condition or change in health will improve Outcome: Progressing   Problem: Fluid Volume: Goal: Ability to maintain a balanced intake and output will improve Outcome: Progressing   Problem: Health Behavior/Discharge Planning: Goal: Ability to identify and utilize available resources and services will improve Outcome: Progressing Goal: Ability to manage health-related needs will improve Outcome: Progressing   Problem: Metabolic: Goal: Ability to maintain appropriate glucose levels will improve Outcome: Progressing   Problem: Nutritional: Goal: Maintenance of adequate nutrition will improve Outcome: Progressing Goal: Progress toward achieving an optimal weight will improve Outcome: Progressing   Problem: Skin Integrity: Goal: Risk for impaired skin integrity will decrease Outcome: Progressing   Problem: Tissue Perfusion: Goal: Adequacy of tissue perfusion will improve Outcome: Progressing   Problem: Education: Goal: Knowledge of General Education information will improve Description: Including pain rating scale, medication(s)/side effects and non-pharmacologic comfort measures Outcome: Progressing   Problem: Health Behavior/Discharge Planning: Goal: Ability to manage health-related needs will improve Outcome: Progressing   Problem: Clinical Measurements: Goal: Ability to maintain clinical measurements within normal limits will improve Outcome: Progressing Goal: Will remain free from infection Outcome: Progressing Goal: Diagnostic test results will improve Outcome: Progressing Goal: Respiratory complications will improve Outcome: Progressing Goal: Cardiovascular complication will be avoided Outcome: Progressing   Problem: Activity: Goal: Risk for activity intolerance will decrease Outcome: Progressing   Problem: Nutrition: Goal:  Adequate nutrition will be maintained Outcome: Progressing   Problem: Coping: Goal: Level of anxiety will decrease Outcome: Progressing   Problem: Elimination: Goal: Will not experience complications related to bowel motility Outcome: Progressing Goal: Will not experience complications related to urinary retention Outcome: Progressing   Problem: Pain Managment: Goal: General experience of comfort will improve and/or be controlled Outcome: Progressing   Problem: Safety: Goal: Ability to remain free from injury will improve Outcome: Progressing   Problem: Skin Integrity: Goal: Risk for impaired skin integrity will decrease Outcome: Progressing   Problem: Education: Goal: Ability to demonstrate management of disease process will improve Outcome: Progressing Goal: Ability to verbalize understanding of medication therapies will improve Outcome: Progressing Goal: Individualized Educational Video(s) Outcome: Progressing   Problem: Activity: Goal: Capacity to carry out activities will improve Outcome: Progressing   Problem: Cardiac: Goal: Ability to achieve and maintain adequate cardiopulmonary perfusion will improve Outcome: Progressing   Problem: Education: Goal: Understanding of CV disease, CV risk reduction, and recovery process will improve Outcome: Progressing Goal: Individualized Educational Video(s) Outcome: Progressing   Problem: Activity: Goal: Ability to return to baseline activity level will improve Outcome: Progressing   Problem: Cardiovascular: Goal: Ability to achieve and maintain adequate cardiovascular perfusion will improve Outcome: Progressing Goal: Vascular access site(s) Level 0-1 will be maintained Outcome: Progressing   Problem: Health Behavior/Discharge Planning: Goal: Ability to safely manage health-related needs after discharge will improve Outcome: Progressing

## 2023-10-15 NOTE — Progress Notes (Signed)
 Patient ID: Alexis Farmer, female   DOB: 1940-11-01, 83 y.o.   MRN: [id number]     Advanced Heart Failure Rounding Note  PCP-Cardiologist: Belva Boyden, MD   Patient Profile   Alexis Farmer is a 83 y.o. female with a PMH of resistant hypertension, chronic HFpEF, obesity, type 2 diabetes, CKD stage IIIb who is seen today for evaluation of acute on chronic diastolic heart failure at the request of Dr Gollan.   Subjective:     BP elevated today but denies dyspnea. Creatinine trending down, 2.0 today.  Weight down as well.   Objective:   Weight Range: 102.4 kg Body mass index is 39.99 kg/m.   Vital Signs:   Temp:  [97.9 F (36.6 C)-99.6 F (37.6 C)] 98.5 F (36.9 C) (04/29 0817) Pulse Rate:  [62-79] 79 (04/29 0817) Resp:  [14-18] 14 (04/29 0817) BP: (145-179)/(43-57) 161/43 (04/29 0817) SpO2:  [93 %-100 %] 93 % (04/29 0817) Weight:  [102.4 kg] 102.4 kg (04/29 0500) Last BM Date : 10/13/23  Weight change: Filed Weights   10/12/23 0500 10/14/23 0500 10/15/23 0500  Weight: 107.9 kg 105.9 kg 102.4 kg    Intake/Output:   Intake/Output Summary (Last 24 hours) at 10/15/2023 0920 Last data filed at 10/14/2023 1000 Gross per 24 hour  Intake 0 ml  Output --  Net 0 ml    Scheduled Meds:  aspirin  EC  81 mg Oral Once per day on Monday Thursday   carvedilol   6.25 mg Oral BID WC   enoxaparin  (LOVENOX ) injection  40 mg Subcutaneous Q24H   hydrALAZINE   100 mg Oral Q8H   insulin  aspart  0-15 Units Subcutaneous TID WC   insulin  aspart  3 Units Subcutaneous TID WC   insulin  glargine-yfgn  20 Units Subcutaneous Daily   morphine   15 mg Oral Q12H   nystatin  cream   Topical BID   polyethylene glycol  17 g Oral Daily   rosuvastatin   20 mg Oral Daily   senna-docusate  1 tablet Oral BID   sodium chloride  flush  3 mL Intravenous Q12H   spironolactone   25 mg Oral Daily   torsemide   20 mg Oral Daily   Continuous Infusions: PRN Meds:.acetaminophen , benzonatate , bisacodyl ,  HYDROcodone -acetaminophen , hydrocortisone , ipratropium-albuterol , LORazepam , ondansetron  **OR** ondansetron  (ZOFRAN ) IV, mouth rinse, sodium chloride  flush, sodium phosphate , traZODone    Physical Exam   General: NAD Neck: No JVD, no thyromegaly or thyroid  nodule.  Lungs: Clear to auscultation bilaterally with normal respiratory effort. CV: Nondisplaced PMI.  Heart regular S1/S2, no S3/S4, no murmur.  No peripheral edema.   Abdomen: Soft, nontender, no hepatosplenomegaly, no distention.  Skin: Intact without lesions or rashes.  Neurologic: Alert and oriented x 3.  Psych: Normal affect. Extremities: No clubbing or cyanosis.  HEENT: Normal.   Telemetry   Sinus 70s (Personally reviewed)  Assessment/Plan   1. Acute on chronic diastolic heart failure:  - RHC last week after diuresis with low filling pressures and mild pulmonary arterial hypertension.  - She is not volume overloaded on exam and creatinine trending down, continue torsemide  20 mg daily.  - Continue Spironolactone  25 mg daily  - Continue Coreg  6.25 mg bid  - She did not tolerate SGLT2 inhibitor in the past.  - Off Entresto  with elevated creatinine.    2. Pulmonary hypertension:  - Mild PAH on RHC.    3. 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  4. Type 2 Diabetes:  - long standing. Poorly controlled, recent Hgb A1c 9.7  - on insulin  - management per IM   5. HTN - BP elevated, increase hydralazine  to 100 mg tid.   Mobilize.   She appears to be ready for discharge from my standpoint (waiting for NSF).  Needs close followup in CHF clinic (next week with APP and should also get BMET later in week).  Meds for home: torsemide  20 daily, hydralazine  100 tid, spironolactone  25 daily, Coreg  6.25 mg bid, ASA 81, Crestor  20.   Length of Stay: 15  [name], MD  10/15/2023, 9:20 AM  Advanced Heart Failure Team Pager [phone]  (M-F; 7a - 5p)  Please contact [facility] for night-coverage after hours (5p -7a ) and weekends on [email or web address]

## 2023-10-16 NOTE — Progress Notes (Deleted)
 Advanced Heart Failure Clinic Note   Referring Physician: recent admission PCP: Valli Gaw, MD Cardiologist: Belva Boyden, MD   Chief Complaint:    HPI:  Alexis Farmer is a 83 y/o female with a history of resistant hypertension, monoclonal B-cell lymphoma, chronic HFpEF, obesity, pHTN, type 2 diabetes and CKD stage IIIb.   Admitted 07/22/23 with RSV infection. Due to significant burden of wheezing was started on nebulizer treatments for reactive airway disease. Chronic LE edema, echo from 10/24 with EF around 60%. Pro-BNP currently 1900.    Admitted 09/29/23 with 2 week history of shortness of breath. Found with chest x-ray with findings suggestive of CHF. Elevated troponin likely related to demand ischemia from CHF and elevated BP. Underwent Right heart cath which showed low filling pressures and mild pulmonary arterial hypertension. Echo 09/30/23: EF 60-65% with moderate LVH, normal RV, G1DD, severely elevated PA pressure of 80.0 mmHg. RHC 10/10/23: mild PAH, moderately reduced CO in setting of hypovolemia. Underwent aggressive diuresis with IV Lasix  which was later on switched to oral torsemide  by cardiology. Entresto  held due to renal function. Discharged to SNF after 16 days.   She presents today for her initial HF visit with a chief complaint of    Review of Systems: [y] = yes, [ ]  = no   General: Weight gain [ ] ; Weight loss [ ] ; Anorexia [ ] ; Fatigue [ ] ; Fever [ ] ; Chills [ ] ; Weakness [ ]   Cardiac: Chest pain/pressure [ ] ; Resting SOB [ ] ; Exertional SOB [ ] ; Orthopnea [ ] ; Pedal Edema [ ] ; Palpitations [ ] ; Syncope [ ] ; Presyncope [ ] ; Paroxysmal nocturnal dyspnea[ ]   Pulmonary: Cough [ ] ; Wheezing[ ] ; Hemoptysis[ ] ; Sputum [ ] ; Snoring [ ]   GI: Vomiting[ ] ; Dysphagia[ ] ; Melena[ ] ; Hematochezia [ ] ; Heartburn[ ] ; Abdominal pain [ ] ; Constipation [ ] ; Diarrhea [ ] ; BRBPR [ ]   GU: Hematuria[ ] ; Dysuria [ ] ; Nocturia[ ]   Vascular: Pain in legs with walking [ ] ; Pain in feet with  lying flat [ ] ; Non-healing sores [ ] ; Stroke [ ] ; TIA [ ] ; Slurred speech [ ] ;  Neuro: Headaches[ ] ; Vertigo[ ] ; Seizures[ ] ; Paresthesias[ ] ;Blurred vision [ ] ; Diplopia [ ] ; Vision changes [ ]   Ortho/Skin: Arthritis [ ] ; Joint pain [ ] ; Muscle pain [ ] ; Joint swelling [ ] ; Back Pain [ ] ; Rash [ ]   Psych: Depression[ ] ; Anxiety[ ]   Heme: Bleeding problems [ ] ; Clotting disorders [ ] ; Anemia [ ]   Endocrine: Diabetes [ ] ; Thyroid  dysfunction[ ]    Past Medical History:  Diagnosis Date   Anxiety 09/13/2015   Arthritis    Atypical chest pain 01/15/2021   Blackhead 10/24/2021   Bradycardia 07/17/2016   Formatting of this note might be different from the original.  Last Assessment & Plan:   Found to be bradycardic at outside hospital. They stopped metoprolol  and verapamil  and heart rate has been normal since then. Echo appears to have been reassuring at the outside facility. Appears to be doing much better with regards to this. She'll continue to monitor for recurrent symptoms. 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  Last Assessment & Plan:   Continues to have issues with this.  She is following with a specialist and it sounds as though they are planning on an MRI.  Benign exam today.   Chickenpox    Chronic kidney disease, stage 2 (mild)  04/08/2014   Dr. Rudean Corrente of this note might be different from the original.  Dr. Zelda Hickman      Last Assessment & Plan:   Recheck kidney function today.   Constipation 11/17/2019   Cough 01/25/2016   Formatting of this note might be different from the original.  Last Assessment & Plan:   Minimal nighttime cough. Improves when she washes her pillows consistently. Suspect allergies contributing. She'll monitor.   Diabetes mellitus without complication (HCC)    one elevated reading/ no treatment   Disorder of rotator cuff 06/24/2018   Diverticulitis     Dysuria 03/22/2017   Formatting of this note might be different from the original.  Last Assessment & Plan:   Symptoms concerning for UTI.  Will check urinalysis.   Edema of foot 04/08/2014   Formatting of this note might be different from the original.  Last Assessment & Plan:   Chronic pedal edema. Suspect venous insufficiency. No orthopnea or shortness of breath. Advised elevation of her legs. Consider compression stockings in the future.   Elevated troponin 12/15/2020   Essential hypertension 04/08/2014   Formatting of this note might be different from the original.  Last Assessment & Plan:   Well-controlled on recheck.  Continue current regimen.   Fall 03/26/2018   Gastroenteritis 08/12/2021   Gastrointestinal hemorrhage 08/03/2015   GI bleed    High cholesterol    History of blood transfusion    Hyperglycemia due to type 2 diabetes mellitus (HCC) 03/11/2015   Hypertension    Hypertensive urgency 12/15/2020   Low back pain 04/08/2014   Morbid obesity (HCC) 04/08/2014   Formatting of this note might be different from the original.  Last Assessment & Plan:   Weight is stable. Discussed diet and exercise at length. Encouraged whatever exercise she can do. Given diet information.   Palpitations 12/15/2020   Peripheral edema 12/22/2019   Postmenopausal bleeding 07/17/2016   Formatting of this note might be different from the original.  Last Assessment & Plan:   Recent D&C. Following with gynecology. Benign findings. Monitor for recurrence.   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  Last Assessment & Plan:   Continues to have issues with this.  She is following with a specialist and it sounds as though they are planning on an MRI.  Benign exam today.   Recurrent falls 08/13/2019   Renal insufficiency    Skin cyst 10/24/2021   Stage 3a  chronic kidney disease (HCC) 01/15/2021   Swelling of lower leg 07/19/2016   Vertigo 10/13/2015   Formatting of this note might be different from the original.  Last Assessment & Plan:   No recurrence. Discussed that meclizine  is an as needed medication and that she does not need to take this daily. She will continue to monitor.    Current Outpatient Medications  Medication Sig Dispense Refill   acetaminophen  (TYLENOL ) 500 MG tablet Take 500 mg by mouth every 6 (six) hours as needed for mild pain or headache.     Ascorbic Acid (VITAMIN C PO) Take 1 tablet by mouth daily.     aspirin  EC 81 MG tablet Take 81 mg by mouth 2 (two) times a week. Swallow whole.     benzonatate  (TESSALON ) 200 MG capsule Take 200 mg by mouth 3 (three) times daily as needed for  cough.     blood glucose meter kit and supplies KIT Accu chek, Dx code E11.65, check 3 times daily 1 each 0   Blood Pressure KIT 1 Device by Does not apply route daily. 1 kit 0   carvedilol  (COREG ) 6.25 MG tablet Take 1 tablet (6.25 mg total) by mouth 2 (two) times daily with a meal.     Dextromethorphan-guaiFENesin (MUCUS RELIEF DM PO) Take by mouth. Liquid cough syrup     gabapentin  (NEURONTIN ) 300 MG capsule Take 300 mg by mouth at bedtime.     glucose blood (ACCU-CHEK GUIDE) test strip USE AS INSTRUCTED 3 TIMES DAILY 300 strip 12   guaiFENesin (SCOT-TUSSIN EXPECTORANT) 100 MG/5ML liquid Take 20 mLs by mouth 3 (three) times daily as needed for cough.     hydrALAZINE  (APRESOLINE ) 100 MG tablet Take 1 tablet (100 mg total) by mouth 3 (three) times daily. TAKE 1 TABLET (100 MG TOTAL) BY MOUTH 4 (FOUR) TIMES DAILY. Must keep upcoming appointment on 10/21/23 for future refills.     insulin  NPH Human (NOVOLIN N) 100 UNIT/ML injection Inject 15-20 Units into the skin 2 (two) times daily before a meal. Inject 20 units in the morning an 15 units in the evening.     insulin  regular (NOVOLIN R) 100 units/mL injection Inject 0-0.08 mLs (0-8 Units total) into  the skin 3 (three) times daily before meals. 30 mL 11   Insulin  Syringe-Needle U-100 (BD INSULIN  SYRINGE U/F) 30G X 1/2" 0.5 ML MISC USE UP TO 5X PER DAY WITH INSULIN  2X (NPH) AND 3X (REGULAR) 500 each 12   ipratropium-albuterol  (DUONEB) 0.5-2.5 (3) MG/3ML SOLN Take 3 mLs by nebulization 4 (four) times daily. Take scheduled 4 times daily until wheezing and shortness of breath from RSV improves. Then take as needed. (Patient not taking: Reported on 09/29/2023)     LORazepam  (ATIVAN ) 1 MG tablet Take 0.5 tablets (0.5 mg total) by mouth daily as needed for anxiety. Inform pt 0.5 dose on backorder has to cut this in 1/2 dose 15 tablet 0   Multiple Vitamins-Minerals (CENTRUM SILVER 50+WOMEN) TABS Take 1 tablet by mouth daily with breakfast.     nystatin  (MYCOSTATIN /NYSTOP ) powder Apply topically 2 (two) times daily. Under abdomen 60 g 11   polyethylene glycol powder (GLYCOLAX /MIRALAX ) 17 GM/SCOOP powder Take 17 g by mouth daily as needed for moderate constipation or severe constipation. Can take up to 2x per day prn. Mix with 8 ounces of liquid 3350 g 11   rosuvastatin  (CRESTOR ) 20 MG tablet TAKE 1 TABLET BY MOUTH EVERY DAY 90 tablet 3   senna-docusate (SENOKOT-S) 8.6-50 MG tablet Take 1 tablet by mouth 2 (two) times daily.     spironolactone  (ALDACTONE ) 25 MG tablet Take 25 mg by mouth 3 (three) times a week. Monday Wednesday and friday     torsemide  (DEMADEX ) 10 MG tablet Take 2 tablets (20 mg total) by mouth daily.     traZODone  (DESYREL ) 50 MG tablet Take 1 tablet (50 mg total) by mouth at bedtime as needed for sleep.     No current facility-administered medications for this visit.    Allergies  Allergen Reactions   Celexa  [Citalopram ]     Diarrhea upset stomach    Jardiance  [Empagliflozin ] Other (See Comments)    Reaction not recalled   Norvasc  [Amlodipine ]     Leg edema   Tape Other (See Comments)    Leaves the skin "raw" if left on for a period of time- tolerates paper tape  Penicillin V  Rash   Penicillin V Potassium Rash   Penicillins Rash    Has patient had a PCN reaction causing immediate rash, facial/tongue/throat swelling, SOB or lightheadedness with hypotension: Yes Has patient had a PCN reaction causing severe rash involving mucus membranes or skin necrosis: No Has patient had a PCN reaction that required hospitalization No Has patient had a PCN reaction occurring within the last 10 years: Yes If all of the above answers are "NO", then may proceed with Cephalosporin use.       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  Tobacco Use   Smoking status: Never   Smokeless tobacco: Never  Vaping Use   Vaping status: Never Used  Substance and Sexual Activity   Alcohol use: No   Drug use: No   Sexual activity: Not Currently  Other Topics Concern   Not on file  Social History Narrative   Lives alone    From [place]   Widowed - was married 3 times starting at age 79    hx of seamtress, 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   Social Drivers of Health   Financial Resource Strain: Medium Risk (10/03/2023)   Overall Financial Resource Strain (CARDIA)    Difficulty of Paying Living Expenses: Somewhat hard  Food Insecurity: No Food Insecurity (10/03/2023)   Hunger Vital Sign    Worried About Running Out of Food in the Last Year: Never true    Ran Out of Food in the Last Year: Never true  Recent Concern: Food Insecurity - Food Insecurity Present (10/03/2023)   Hunger Vital Sign    Worried About Running Out of Food in the Last Year: Sometimes true    Ran Out of Food in the Last Year: Never true  Transportation Needs: No Transportation  Needs (10/03/2023)   PRAPARE - Administrator, Civil Service (Medical): No    Lack of Transportation (Non-Medical): No  Recent Concern: Transportation Needs - Unmet Transportation Needs (09/23/2023)   PRAPARE - Transportation    Lack of Transportation (Medical): Yes    Lack of Transportation (Non-Medical): Yes  Physical Activity: Inactive (10/03/2023)   Exercise Vital Sign    Days of Exercise per Week: 0 days    Minutes of Exercise per Session: 0 min  Stress: Stress Concern Present (11/29/2022)   [employer] of Occupational Health - Occupational Stress Questionnaire    Feeling of Stress : To some extent  Social Connections: Moderately Integrated (09/30/2023)   Social Connection and Isolation Panel [NHANES]    Frequency of Communication with Friends and Family: More than three times a week    Frequency of Social Gatherings with Friends and Family: More than three times a week    Attends Religious Services: More than 4 times per year    Active Member of [employer] or Organizations: Yes    Attends Banker Meetings: More than 4 times per year    Marital Status: Widowed  Intimate Partner Violence: Not At Risk (09/30/2023)   Humiliation, Afraid, Rape, and Kick questionnaire    Fear of Current or Ex-Partner: No    Emotionally Abused: No  Physically Abused: No    Sexually Abused: No      Family History  Problem Relation Age of Onset   Diabetes Mother    Hypertension Mother    Stroke Mother    Diabetes Other    Healthy Father    Diabetes Sister    Heart disease Sister        PHYSICAL EXAM: General:  Well appearing. No respiratory difficulty HEENT: normal Neck: supple. no JVD. Carotids 2+ bilat; no bruits. No lymphadenopathy or thyromegaly appreciated. Cor: PMI nondisplaced. Regular rate & rhythm. No rubs, gallops or murmurs. Lungs: clear Abdomen: soft, nontender, nondistended. No hepatosplenomegaly. No bruits or masses. Good bowel sounds. Extremities:  no cyanosis, clubbing, rash, edema Neuro: alert & oriented x 3, cranial nerves grossly intact. moves all 4 extremities w/o difficulty. Affect pleasant.  ECG:   ASSESSMENT & PLAN:  1: Chronic heart failure with preserved ejection fraction- - suspect due to - NYHA class - euvolemic - weighing daily at SNF - Echo 09/30/23: EF 60-65% with moderate LVH, normal RV, G1DD, severely elevated PA pressure of 80.0 mmHg. - continue - saw cardiology Jerelene Monday) 01/24 - BNP 10/09/23 was 223.7  2: HTN- - BP - seeing PCP at SNF - BMET 10/15/23 reviewed: sodium 133, potassium 4.0, creatinine 2.01 & GFR 24  3: pHTN- - RHC 10/10/23: mild PAH, moderately reduced CO in setting of hypovolemia.  4: DM- - A1c 08/01/23 was 9.7% - saw endocrinology Shelvy Dickens) 07/24 - saw nephrology Zelda Hickman) 03/25  5: Monoclonal B-cell lymphoma- - saw oncology Wilhelmenia Harada) 01/25  6: PAD- - saw vascular Edgardo Goodwill) 08/24    Charlette Console, FNP 10/16/23

## 2023-10-17 ENCOUNTER — Encounter: Admitting: Family

## 2023-10-19 ENCOUNTER — Emergency Department (HOSPITAL_COMMUNITY)

## 2023-10-19 ENCOUNTER — Other Ambulatory Visit: Payer: Self-pay

## 2023-10-19 ENCOUNTER — Inpatient Hospital Stay (HOSPITAL_COMMUNITY)
Admission: EM | Admit: 2023-10-19 | Discharge: 2023-10-24 | DRG: 065 | Disposition: A | Discharge status: Skilled Nursing Facility | Source: Skilled Nursing Facility | Attending: Internal Medicine | Admitting: Internal Medicine

## 2023-10-19 DIAGNOSIS — E1142 Type 2 diabetes mellitus with diabetic polyneuropathy: Secondary | ICD-10-CM | POA: Diagnosis present

## 2023-10-19 DIAGNOSIS — E871 Hypo-osmolality and hyponatremia: Secondary | ICD-10-CM | POA: Diagnosis present

## 2023-10-19 DIAGNOSIS — G8194 Hemiplegia, unspecified affecting left nondominant side: Secondary | ICD-10-CM | POA: Diagnosis present

## 2023-10-19 DIAGNOSIS — Z79899 Other long term (current) drug therapy: Secondary | ICD-10-CM

## 2023-10-19 DIAGNOSIS — E1122 Type 2 diabetes mellitus with diabetic chronic kidney disease: Secondary | ICD-10-CM | POA: Diagnosis present

## 2023-10-19 DIAGNOSIS — E039 Hypothyroidism, unspecified: Secondary | ICD-10-CM | POA: Diagnosis present

## 2023-10-19 DIAGNOSIS — R270 Ataxia, unspecified: Secondary | ICD-10-CM | POA: Diagnosis present

## 2023-10-19 DIAGNOSIS — E7800 Pure hypercholesterolemia, unspecified: Secondary | ICD-10-CM | POA: Diagnosis present

## 2023-10-19 DIAGNOSIS — E66813 Obesity, class 3: Secondary | ICD-10-CM | POA: Diagnosis present

## 2023-10-19 DIAGNOSIS — E1165 Type 2 diabetes mellitus with hyperglycemia: Secondary | ICD-10-CM | POA: Diagnosis not present

## 2023-10-19 DIAGNOSIS — R531 Weakness: Secondary | ICD-10-CM | POA: Diagnosis present

## 2023-10-19 DIAGNOSIS — F419 Anxiety disorder, unspecified: Secondary | ICD-10-CM | POA: Diagnosis present

## 2023-10-19 DIAGNOSIS — R1310 Dysphagia, unspecified: Secondary | ICD-10-CM | POA: Diagnosis present

## 2023-10-19 DIAGNOSIS — R29703 NIHSS score 3: Secondary | ICD-10-CM | POA: Diagnosis present

## 2023-10-19 DIAGNOSIS — I130 Hypertensive heart and chronic kidney disease with heart failure and stage 1 through stage 4 chronic kidney disease, or unspecified chronic kidney disease: Secondary | ICD-10-CM | POA: Diagnosis present

## 2023-10-19 DIAGNOSIS — C8510 Unspecified B-cell lymphoma, unspecified site: Secondary | ICD-10-CM | POA: Diagnosis present

## 2023-10-19 DIAGNOSIS — I63411 Cerebral infarction due to embolism of right middle cerebral artery: Secondary | ICD-10-CM | POA: Diagnosis not present

## 2023-10-19 DIAGNOSIS — Z888 Allergy status to other drugs, medicaments and biological substances status: Secondary | ICD-10-CM

## 2023-10-19 DIAGNOSIS — I63131 Cerebral infarction due to embolism of right carotid artery: Secondary | ICD-10-CM | POA: Diagnosis not present

## 2023-10-19 DIAGNOSIS — I6340 Cerebral infarction due to embolism of unspecified cerebral artery: Principal | ICD-10-CM | POA: Diagnosis present

## 2023-10-19 DIAGNOSIS — I69391 Dysphagia following cerebral infarction: Secondary | ICD-10-CM | POA: Diagnosis not present

## 2023-10-19 DIAGNOSIS — E1129 Type 2 diabetes mellitus with other diabetic kidney complication: Secondary | ICD-10-CM | POA: Diagnosis present

## 2023-10-19 DIAGNOSIS — Z794 Long term (current) use of insulin: Secondary | ICD-10-CM | POA: Diagnosis not present

## 2023-10-19 DIAGNOSIS — M25572 Pain in left ankle and joints of left foot: Secondary | ICD-10-CM

## 2023-10-19 DIAGNOSIS — Z823 Family history of stroke: Secondary | ICD-10-CM

## 2023-10-19 DIAGNOSIS — E1169 Type 2 diabetes mellitus with other specified complication: Secondary | ICD-10-CM | POA: Diagnosis present

## 2023-10-19 DIAGNOSIS — Z833 Family history of diabetes mellitus: Secondary | ICD-10-CM | POA: Diagnosis not present

## 2023-10-19 DIAGNOSIS — Z880 Allergy status to penicillin: Secondary | ICD-10-CM

## 2023-10-19 DIAGNOSIS — D75839 Thrombocytosis, unspecified: Secondary | ICD-10-CM | POA: Diagnosis present

## 2023-10-19 DIAGNOSIS — I5032 Chronic diastolic (congestive) heart failure: Secondary | ICD-10-CM | POA: Diagnosis present

## 2023-10-19 DIAGNOSIS — N179 Acute kidney failure, unspecified: Secondary | ICD-10-CM | POA: Diagnosis present

## 2023-10-19 DIAGNOSIS — I509 Heart failure, unspecified: Secondary | ICD-10-CM | POA: Diagnosis not present

## 2023-10-19 DIAGNOSIS — I639 Cerebral infarction, unspecified: Principal | ICD-10-CM

## 2023-10-19 DIAGNOSIS — N183 Chronic kidney disease, stage 3 unspecified: Secondary | ICD-10-CM | POA: Diagnosis not present

## 2023-10-19 DIAGNOSIS — I5030 Unspecified diastolic (congestive) heart failure: Secondary | ICD-10-CM | POA: Diagnosis not present

## 2023-10-19 DIAGNOSIS — N1832 Chronic kidney disease, stage 3b: Secondary | ICD-10-CM | POA: Diagnosis present

## 2023-10-19 DIAGNOSIS — R609 Edema, unspecified: Secondary | ICD-10-CM | POA: Diagnosis not present

## 2023-10-19 DIAGNOSIS — I6521 Occlusion and stenosis of right carotid artery: Secondary | ICD-10-CM | POA: Diagnosis present

## 2023-10-19 DIAGNOSIS — Z7902 Long term (current) use of antithrombotics/antiplatelets: Secondary | ICD-10-CM

## 2023-10-19 DIAGNOSIS — Z6839 Body mass index (BMI) 39.0-39.9, adult: Secondary | ICD-10-CM

## 2023-10-19 DIAGNOSIS — Z8249 Family history of ischemic heart disease and other diseases of the circulatory system: Secondary | ICD-10-CM | POA: Diagnosis not present

## 2023-10-19 DIAGNOSIS — Z91048 Other nonmedicinal substance allergy status: Secondary | ICD-10-CM

## 2023-10-19 DIAGNOSIS — E785 Hyperlipidemia, unspecified: Secondary | ICD-10-CM | POA: Diagnosis not present

## 2023-10-19 DIAGNOSIS — Z7982 Long term (current) use of aspirin: Secondary | ICD-10-CM

## 2023-10-19 DIAGNOSIS — Z8673 Personal history of transient ischemic attack (TIA), and cerebral infarction without residual deficits: Secondary | ICD-10-CM

## 2023-10-19 LAB — CBC WITH DIFFERENTIAL/PLATELET
Abs Immature Granulocytes: 0.02 10*3/uL (ref 0.00–0.07)
Basophils Absolute: 0.1 10*3/uL (ref 0.0–0.1)
Basophils Relative: 1 %
Eosinophils Absolute: 0.2 10*3/uL (ref 0.0–0.5)
Eosinophils Relative: 2 %
HCT: 36.8 % (ref 36.0–46.0)
Hemoglobin: 11.8 g/dL — ABNORMAL LOW (ref 12.0–15.0)
Immature Granulocytes: 0 %
Lymphocytes Relative: 49 %
Lymphs Abs: 4.2 10*3/uL — ABNORMAL HIGH (ref 0.7–4.0)
MCH: 28.6 pg (ref 26.0–34.0)
MCHC: 32.1 g/dL (ref 30.0–36.0)
MCV: 89.1 fL (ref 80.0–100.0)
Monocytes Absolute: 0.8 10*3/uL (ref 0.1–1.0)
Monocytes Relative: 9 %
Neutro Abs: 3.4 10*3/uL (ref 1.7–7.7)
Neutrophils Relative %: 39 %
Platelets: 591 10*3/uL — ABNORMAL HIGH (ref 150–400)
RBC: 4.13 MIL/uL (ref 3.87–5.11)
RDW: 14.3 % (ref 11.5–15.5)
WBC: 8.6 10*3/uL (ref 4.0–10.5)
nRBC: 0 % (ref 0.0–0.2)

## 2023-10-19 LAB — COMPREHENSIVE METABOLIC PANEL WITH GFR
ALT: 20 U/L (ref 0–44)
AST: 34 U/L (ref 15–41)
Albumin: 2.9 g/dL — ABNORMAL LOW (ref 3.5–5.0)
Alkaline Phosphatase: 44 U/L (ref 38–126)
Anion gap: 12 (ref 5–15)
BUN: 42 mg/dL — ABNORMAL HIGH (ref 8–23)
CO2: 28 mmol/L (ref 22–32)
Calcium: 9.1 mg/dL (ref 8.9–10.3)
Chloride: 91 mmol/L — ABNORMAL LOW (ref 98–111)
Creatinine, Ser: 2.57 mg/dL — ABNORMAL HIGH (ref 0.44–1.00)
GFR, Estimated: 18 mL/min — ABNORMAL LOW (ref >60)
Glucose, Bld: 202 mg/dL — ABNORMAL HIGH (ref 70–99)
Potassium: 4 mmol/L (ref 3.5–5.1)
Sodium: 131 mmol/L — ABNORMAL LOW (ref 135–145)
Total Bilirubin: 0.7 mg/dL (ref 0.0–1.2)
Total Protein: 6.1 g/dL — ABNORMAL LOW (ref 6.5–8.1)

## 2023-10-19 LAB — URINALYSIS, ROUTINE W REFLEX MICROSCOPIC
Bilirubin Urine: NEGATIVE
Glucose, UA: 50 mg/dL — AB
Hgb urine dipstick: NEGATIVE
Ketones, ur: NEGATIVE mg/dL
Leukocytes,Ua: NEGATIVE
Nitrite: NEGATIVE
Protein, ur: 100 mg/dL — AB
Specific Gravity, Urine: 1.004 — ABNORMAL LOW (ref 1.005–1.030)
pH: 7 (ref 5.0–8.0)

## 2023-10-19 LAB — TSH: TSH: 1.955 u[IU]/mL (ref 0.350–4.500)

## 2023-10-19 LAB — HEMOGLOBIN A1C
Hgb A1c MFr Bld: 8.7 % — ABNORMAL HIGH (ref 4.8–5.6)
Mean Plasma Glucose: 202.99 mg/dL

## 2023-10-19 LAB — T4, FREE: Free T4: 1.06 ng/dL (ref 0.61–1.12)

## 2023-10-19 MED ORDER — CARVEDILOL 3.125 MG PO TABS
3.1250 mg | ORAL_TABLET | Freq: Two times a day (BID) | ORAL | Status: DC
  Administered 2023-10-19 – 2023-10-21 (×4): 3.125 mg via ORAL
  Filled 2023-10-19 (×4): qty 1

## 2023-10-19 MED ORDER — HEPARIN SODIUM (PORCINE) 5000 UNIT/ML IJ SOLN
5000.0000 [IU] | Freq: Three times a day (TID) | INTRAMUSCULAR | Status: DC
  Administered 2023-10-19 – 2023-10-24 (×15): 5000 [IU] via SUBCUTANEOUS
  Filled 2023-10-19 (×16): qty 1

## 2023-10-19 MED ORDER — STROKE: EARLY STAGES OF RECOVERY BOOK
Freq: Once | Status: AC
  Filled 2023-10-19: qty 1

## 2023-10-19 MED ORDER — HEPARIN SODIUM (PORCINE) 5000 UNIT/ML IJ SOLN: 5000.0000 [IU] | Freq: Two times a day (BID) | INTRAMUSCULAR | Status: DC

## 2023-10-19 MED ORDER — MELATONIN 5 MG PO TABS
5.0000 mg | ORAL_TABLET | Freq: Every day | ORAL | Status: DC
  Administered 2023-10-19: 5 mg via ORAL
  Filled 2023-10-19: qty 1

## 2023-10-19 MED ORDER — CLOPIDOGREL BISULFATE 75 MG PO TABS
75.0000 mg | ORAL_TABLET | Freq: Every day | ORAL | Status: DC
  Administered 2023-10-19 – 2023-10-24 (×6): 75 mg via ORAL
  Filled 2023-10-19 (×6): qty 1

## 2023-10-19 MED ORDER — HYDRALAZINE HCL 25 MG PO TABS
25.0000 mg | ORAL_TABLET | Freq: Three times a day (TID) | ORAL | Status: DC
  Administered 2023-10-19 – 2023-10-20 (×2): 25 mg via ORAL
  Filled 2023-10-19 (×3): qty 1

## 2023-10-19 MED ORDER — LACTATED RINGERS IV BOLUS
500.0000 mL | Freq: Once | INTRAVENOUS | Status: AC
  Administered 2023-10-19: 500 mL via INTRAVENOUS

## 2023-10-19 MED ORDER — INSULIN ASPART 100 UNIT/ML IJ SOLN
0.0000 [IU] | INTRAMUSCULAR | Status: DC | PRN
  Administered 2023-10-20: 5 [IU] via SUBCUTANEOUS

## 2023-10-19 MED ORDER — LORAZEPAM 2 MG/ML IJ SOLN
0.5000 mg | Freq: Once | INTRAMUSCULAR | Status: AC | PRN
  Administered 2023-10-19: 0.5 mg via INTRAVENOUS
  Filled 2023-10-19: qty 1

## 2023-10-19 MED ORDER — CARVEDILOL 3.125 MG PO TABS: 3.1250 mg | ORAL_TABLET | Freq: Two times a day (BID) | ORAL | Status: DC

## 2023-10-19 MED ORDER — ASPIRIN 81 MG PO CHEW
81.0000 mg | CHEWABLE_TABLET | Freq: Every day | ORAL | Status: DC
  Administered 2023-10-19 – 2023-10-20 (×2): 81 mg via ORAL
  Filled 2023-10-19 (×2): qty 1

## 2023-10-19 MED ORDER — ROSUVASTATIN CALCIUM 20 MG PO TABS
20.0000 mg | ORAL_TABLET | Freq: Every day | ORAL | Status: DC
  Administered 2023-10-19 – 2023-10-24 (×6): 20 mg via ORAL
  Filled 2023-10-19 (×6): qty 1

## 2023-10-19 MED ORDER — ACETAMINOPHEN 325 MG PO TABS
650.0000 mg | ORAL_TABLET | ORAL | Status: DC | PRN
  Administered 2023-10-19 – 2023-10-20 (×2): 650 mg via ORAL
  Filled 2023-10-19 (×2): qty 2

## 2023-10-19 NOTE — ED Notes (Signed)
 Patient transported to MRI

## 2023-10-19 NOTE — ED Notes (Signed)
 Attempted to stand pt and pivot to [facility]. Pt leaning to left, unable to hold self up without 2+ assist.  Pt placed back on stretcher. Dr Jeannie Milo updated.

## 2023-10-19 NOTE — ED Notes (Signed)
 Pt returned from MRI. NAD noted. RR even and unlabored.

## 2023-10-19 NOTE — ED Provider Notes (Signed)
 [place] EMERGENCY DEPARTMENT AT [facility] Provider Note  CSN: [id number] Arrival date & time: 10/19/23 1034  Chief Complaint(s) No chief complaint on file.  HPI Alexis Farmer is a 83 y.o. female with PMH marginal B-cell lymphoma, CKD 3, T2DM, CHF, recent hospitalization and discharged on 10/15/2023 for CHF exacerbation who presents Emergency Department for evaluation of generalized weakness and muscle twitching.  States she is usually able to get up and walk to the commode but this morning she was unable to stand on her feet.  Also endorses intermittent muscle twitching.  Feels improved here in the emergency department compared to this morning.  Denies current chest pain, shortness of breath, headache, fever or other systemic symptoms.   Past Medical History Past Medical History:  Diagnosis Date   Anxiety 09/13/2015   Arthritis    Atypical chest pain 01/15/2021   Blackhead 10/24/2021   Bradycardia 07/17/2016   Formatting of this note might be different from the original.  Last Assessment & Plan:   Found to be bradycardic at outside hospital. They stopped metoprolol  and verapamil  and heart rate has been normal since then. Echo appears to have been reassuring at the outside facility. Appears to be doing much better with regards to this. She'll continue to monitor for recurrent symptoms. 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  Last Assessment & Plan:   Continues to have issues with this.  She is following with a specialist and it sounds as though they are planning on an MRI.  Benign exam today.   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 Rudean Corrente of this note might be different from the original.  Dr. Zelda Hickman      Last Assessment & Plan:   Recheck kidney function today.   Constipation 11/17/2019   Cough 01/25/2016   Formatting of this note  might be different from the original.  Last Assessment & Plan:   Minimal nighttime cough. Improves when she washes her pillows consistently. Suspect allergies contributing. She'll monitor.   Diabetes mellitus without complication (HCC)    one elevated reading/ no treatment   Disorder of rotator cuff 06/24/2018   Diverticulitis    Dysuria 03/22/2017   Formatting of this note might be different from the original.  Last Assessment & Plan:   Symptoms concerning for UTI.  Will check urinalysis.   Edema of foot 04/08/2014   Formatting of this note might be different from the original.  Last Assessment & Plan:   Chronic pedal edema. Suspect venous insufficiency. No orthopnea or shortness of breath. Advised elevation of her legs. Consider compression stockings in the future.   Elevated troponin 12/15/2020   Essential hypertension 04/08/2014   Formatting of this note might be different from the original.  Last Assessment & Plan:   Well-controlled on recheck.  Continue current regimen.   Fall 03/26/2018   Gastroenteritis 08/12/2021   Gastrointestinal hemorrhage 08/03/2015   GI bleed    High cholesterol    History of blood transfusion    Hyperglycemia due to type 2 diabetes mellitus (HCC) 03/11/2015   Hypertension    Hypertensive urgency 12/15/2020   Low back pain 04/08/2014   Morbid obesity (HCC) 04/08/2014   Formatting of this note might be different from the original.  Last Assessment & Plan:   Weight is stable. Discussed diet and  exercise at length. Encouraged whatever exercise she can do. Given diet information.   Palpitations 12/15/2020   Peripheral edema 12/22/2019   Postmenopausal bleeding 07/17/2016   Formatting of this note might be different from the original.  Last Assessment & Plan:   Recent D&C. Following with gynecology. Benign findings. Monitor for recurrence.   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  Last Assessment & Plan:   Continues to have issues with this.  She is following with a specialist and it sounds as though they are planning on an MRI.  Benign exam today.   Recurrent falls 08/13/2019   Renal insufficiency    Skin cyst 10/24/2021   Stage 3a chronic kidney disease (HCC) 01/15/2021   Swelling of lower leg 07/19/2016   Vertigo 10/13/2015   Formatting of this note might be different from the original.  Last Assessment & Plan:   No recurrence. Discussed that meclizine  is an as needed medication and that she does not need to take this daily. She will continue to monitor.   Patient Active Problem List   Diagnosis Date Noted   Shortness of breath 10/02/2023   Morbid obesity (HCC) 10/02/2023   Severe pulmonary hypertension (HCC) 10/01/2023   Congestive heart failure (HCC) 09/29/2023   RSV infection 08/08/2023   Obesity (BMI 30-39.9) 08/04/2023   Diffuse pain 08/03/2023   Abdominal pain 08/03/2023   Abdominal pain, LLQ 08/03/2023   Cellulitis of left lower extremity 04/02/2023   Hyponatremia 04/01/2023   Acute on chronic heart failure with preserved ejection fraction (HFpEF) (HCC) 04/01/2023   Cellulitis 04/01/2023   Lower extremity weakness 04/01/2023   Inability to access health care due to transportation insecurity 03/31/2023   Mood disorder (HCC) 01/20/2023   Vision loss, bilateral 10/22/2022   Pre-syncope 10/22/2022   Ceruminosis, right 09/07/2022   Type 2 diabetes mellitus without complications (HCC) 08/14/2022   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/2022   Hallucinations, visual 07/20/2022   Need for pneumococcal vaccination 07/20/2022   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   Chronic renal failure (CRF), stage 3b (HCC) 03/21/2022   Ileus (HCC) 08/22/2021   Lymphedema 04/19/2021   Atherosclerosis of aorta (HCC) 01/16/2021   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/16/2021    Spinal stenosis of lumbar region 01/16/2021   Panic attack 01/15/2021   Hypertensive urgency 12/15/2020   Other chronic pain 08/25/2020   Hypertension associated with diabetes (HCC) 08/25/2020   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) 05/30/2020   Retinopathy 05/30/2020   Multinodular goiter 02/04/2020   Proteinuria 12/22/2019   Physical deconditioning 08/13/2019   Abdominal aortic atherosclerosis (HCC) 07/24/2019   Obesity, Class III, BMI 40-49.9 (morbid obesity) 06/09/2019   Hypercalcemia 03/18/2019   Acute renal failure superimposed on stage 3b chronic kidney disease (HCC) 02/06/2019   Round hole, unspecified eye 02/06/2019   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 08/13/2018   Abnormal gait 03/26/2018   Arthritis 03/26/2018   Thyroid  nodule 01/20/2018   Cerumen impaction 01/21/2017   UTI (urinary tract infection) 09/24/2016   Rectal hemorrhage 07/29/2016   Carotid artery stenosis 07/19/2016   PAD (peripheral artery disease) (HCC) 07/19/2016   Chronic diastolic CHF (congestive heart failure) (HCC) 05/31/2016   Chronic fatigue 11/15/2015   Depression,  recurrent (HCC) 10/13/2015   Osteoarthritis 10/13/2015   Mixed hyperlipidemia 08/26/2014   Uncontrolled type 2 diabetes mellitus with hyperglycemia, with long-term current use of insulin  (HCC) 04/08/2014   Diabetic polyneuropathy (HCC) 04/08/2014   Type 2 diabetes mellitus with other diabetic kidney complication (HCC) 04/08/2014   Home Medication(s) Prior to Admission medications   Medication Sig Start Date End Date Taking? Authorizing Provider  acetaminophen  (TYLENOL ) 500 MG tablet Take 500 mg by mouth every 6 (six) hours as needed for mild pain or headache.   Yes [provider]  Ascorbic Acid (VITAMIN C PO) Take 1 tablet by mouth daily.   Yes  [provider]  aspirin  EC 81 MG tablet Take 81 mg by mouth 2 (two) times a week. Swallow whole.   Yes [provider]  benzonatate  (TESSALON ) 200 MG capsule Take 200 mg by mouth 3 (three) times daily as needed for cough.   Yes [provider]  carvedilol  (COREG ) 6.25 MG tablet Take 1 tablet (6.25 mg total) by mouth 2 (two) times daily with a meal. 10/15/23  Yes Djan, Esther Hem, MD  gabapentin  (NEURONTIN ) 300 MG capsule Take 300 mg by mouth at bedtime. 12/25/17  Yes [provider]  guaiFENesin (SCOT-TUSSIN EXPECTORANT) 100 MG/5ML liquid Take 20 mLs by mouth 3 (three) times daily as needed for cough. 07/26/23  Yes [provider]  hydrALAZINE  (APRESOLINE ) 100 MG tablet Take 1 tablet (100 mg total) by mouth 3 (three) times daily. TAKE 1 TABLET (100 MG TOTAL) BY MOUTH 4 (FOUR) TIMES DAILY. Must keep upcoming appointment on 10/21/23 for future refills. Patient taking differently: Take 100 mg by mouth in the morning, at noon, in the evening, and at bedtime. TAKE 1 TABLET (100 MG TOTAL) BY MOUTH 4 (FOUR) TIMES DAILY. Must keep upcoming appointment on 10/21/23 for future refills. 10/15/23  Yes Ezzard Holms, MD  insulin  NPH Human (NOVOLIN N) 100 UNIT/ML injection Inject 15-20 Units into the skin 2 (two) times daily before a meal. Inject 20 units in the morning an 15 units in the evening. 06/08/19  Yes [provider]  insulin  regular (NOVOLIN R) 100 units/mL injection Inject 0-0.08 mLs (0-8 Units total) into the skin 3 (three) times daily before meals. 10/17/20  Yes Gwyndolyn Lerner, MD  ipratropium-albuterol  (DUONEB) 0.5-2.5 (3) MG/3ML SOLN Take 3 mLs by nebulization 4 (four) times daily. Take scheduled 4 times daily until wheezing and shortness of breath from RSV improves. Then take as needed.   Yes [provider]  LORazepam  (ATIVAN ) 1 MG tablet Take 0.5 tablets (0.5 mg total) by mouth daily as needed for anxiety. Inform pt 0.5 dose on backorder has to cut  this in 1/2 dose 09/03/23  Yes Valli Gaw, MD  Multiple Vitamins-Minerals (CENTRUM SILVER 50+WOMEN) TABS Take 1 tablet by mouth daily with breakfast.   Yes [provider]  polyethylene glycol powder (GLYCOLAX /MIRALAX ) 17 GM/SCOOP powder Take 17 g by mouth daily as needed for moderate constipation or severe constipation. Can take up to 2x per day prn. Mix with 8 ounces of liquid 03/21/22  Yes McLean-Scocuzza, Karon Packer, MD  rosuvastatin  (CRESTOR ) 20 MG tablet TAKE 1 TABLET BY MOUTH EVERY DAY 03/21/22  Yes McLean-Scocuzza, Karon Packer, MD  senna-docusate (SENOKOT-S) 8.6-50 MG tablet Take 1 tablet by mouth 2 (two) times daily. 10/15/23  Yes Ezzard Holms, MD  spironolactone  (ALDACTONE ) 25 MG tablet Take 25 mg by mouth 3 (three) times a week. Monday Wednesday and friday   Yes [provider]  torsemide  (DEMADEX ) 10 MG tablet Take 2 tablets (20 mg total) by mouth daily. 10/15/23  Yes Ezzard Holms, MD  traZODone  (DESYREL ) 50 MG tablet Take 1 tablet (50 mg total) by mouth at bedtime as needed for sleep. 10/15/23  Yes Ezzard Holms, MD  blood glucose meter kit and supplies KIT Accu chek, Dx code E11.65, check 3 times daily 07/26/17   Kent Pear, MD  Blood Pressure KIT 1 Device by Does not apply route daily. 10/24/21   McLean-Scocuzza, Karon Packer, MD  glucose blood (ACCU-CHEK GUIDE) test strip USE AS INSTRUCTED 3 TIMES DAILY 09/06/22   Valli Gaw, MD  Insulin  Syringe-Needle U-100 (BD INSULIN  SYRINGE U/F) 30G X 1/2" 0.5 ML MISC USE UP TO 5X PER DAY WITH INSULIN  2X (NPH) AND 3X (REGULAR) 12/18/22   Valli Gaw, MD                                                                                                                                    Past Surgical History Past Surgical History:  Procedure Laterality Date   APPENDECTOMY     CHOLECYSTECTOMY     ECTOPIC PREGNANCY SURGERY     EYE SURGERY     bilateral cataracts   EYE SURGERY     02/11/2019 repair hole in right eye    gallbladder       HYSTEROSCOPY WITH D & C N/A 10/26/2016   Procedure: DILATATION AND CURETTAGE /HYSTEROSCOPY;  Surgeon: Prescilla Brod, MD;  Location: [facility] ORS;  Service: Gynecology;  Laterality: N/A;   HYSTEROSCOPY WITH D & C N/A 07/07/2018   Procedure: DILATATION AND CURETTAGE /HYSTEROSCOPY;  Surgeon: Prescilla Brod, MD;  Location: [facility] ORS;  Service: Gynecology;  Laterality: N/A;   RIGHT HEART CATH Bilateral 10/10/2023   Procedure: RIGHT HEART CATH;  Surgeon: Mardell Shade, MD;  Location: [facility] INVASIVE CV LAB;  Service: Cardiovascular;  Laterality: Bilateral;   THYROIDECTOMY, PARTIAL     Family History Family History  Problem Relation Age of Onset   Diabetes Mother    Hypertension Mother    Stroke Mother    Diabetes Other    Healthy Father    Diabetes Sister    Heart disease Sister     Social History Social History   Tobacco Use   Smoking status: Never   Smokeless tobacco: Never  Vaping Use   Vaping status: Never Used  Substance Use Topics   Alcohol use: No   Drug use: No   Allergies Celexa  [citalopram ], Jardiance  [empagliflozin ], Norvasc  [amlodipine ], Tape, Penicillin v, Penicillin v potassium, and Penicillins  Review of Systems Review of Systems  Constitutional:  Positive for fatigue.  Neurological:  Positive for weakness.    Physical Exam Vital Signs  I have reviewed the triage vital signs BP 117/67   Pulse 66   Temp 98.4 F (36.9 C) (Oral)   Resp 15   SpO2 100%  Physical Exam Vitals and nursing note reviewed.  Constitutional:      General: She is not in acute distress.    Appearance: She is well-developed.  HENT:     Head: Normocephalic and atraumatic.  Eyes:     Conjunctiva/sclera: Conjunctivae normal.  Cardiovascular:     Rate and Rhythm: Normal rate and regular rhythm.     Heart sounds: No murmur heard. Pulmonary:     Effort: Pulmonary effort is normal. No respiratory distress.     Breath sounds: Normal breath sounds.  Abdominal:     Palpations:  Abdomen is soft.     Tenderness: There is no abdominal tenderness.  Musculoskeletal:        General: No swelling.     Cervical back: Neck supple.  Skin:    General: Skin is warm and dry.     Capillary Refill: Capillary refill takes less than 2 seconds.  Neurological:     Mental Status: She is alert.     Motor: Weakness present.  Psychiatric:        Mood and Affect: Mood normal.     ED Results and Treatments Labs (all labs ordered are listed, but only abnormal results are displayed) Labs Reviewed  COMPREHENSIVE METABOLIC PANEL WITH GFR - Abnormal; Notable for the following components:      Result Value   Sodium 131 (*)    Chloride 91 (*)    Glucose, Bld 202 (*)    BUN 42 (*)    Creatinine, Ser 2.57 (*)    Total Protein 6.1 (*)    Albumin 2.9 (*)    GFR, Estimated 18 (*)    All other components within normal limits  CBC WITH DIFFERENTIAL/PLATELET - Abnormal; Notable for the following components:   Hemoglobin 11.8 (*)    Platelets 591 (*)    Lymphs Abs 4.2 (*)    All other components within normal limits  URINALYSIS, ROUTINE W REFLEX MICROSCOPIC - Abnormal; Notable for the following components:   Specific Gravity, Urine 1.004 (*)    Glucose, UA 50 (*)    Protein, ur 100 (*)    Bacteria, UA RARE (*)    All other components within normal limits  LIPID PANEL                                                                                                                          Radiology MR BRAIN WO CONTRAST Result Date: 10/19/2023 CLINICAL DATA:  Neuro deficit, acute, stroke suspected. Numbness and weakness in the left lower extremity. Abnormal gait. Last known well at 7 p.m. yesterday. EXAM: MRI HEAD WITHOUT CONTRAST TECHNIQUE: Multiplanar, multiecho pulse sequences of the brain and surrounding structures were obtained without intravenous contrast. COMPARISON:  CT head without contrast 08/03/2023. MR head without contrast 11/27/2022. FINDINGS: Brain: Acute nonhemorrhagic  cortical infarct is present in the medial right frontal lobe. T2 and FLAIR signal hyperintensities are associated. A 10 mm cortical infarct is present in the  medial left parietal lobe. Punctate cortical infarct is present in the right precentral gyrus on image 39 of series 2. Additional punctate infarcts are present in the right frontal operculum and right middle frontal gyrus. Separate from the acute/subacute infarcts is a stable. Confluent periventricular and subcortical T2 hyperintensities, advanced for age. A late subacute infarct is present in the lateral right occipital lobe. No acute hemorrhage or mass lesion is present. The ventricles are proportionate to the degree of atrophy. No significant extraaxial fluid collection is present. Vascular: Flow is present in the major intracranial arteries. Skull and upper cervical spine: The craniocervical junction is normal. Upper cervical spine is within normal limits. Marrow signal is unremarkable. Sinuses/Orbits: The paranasal sinuses and mastoid air cells are clear. Bilateral lens replacements are noted. Globes and orbits are otherwise unremarkable. IMPRESSION: 1. 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 2. 10 mm cortical infarct in the medial left parietal lobe. 3. Punctate cortical infarcts in the right precentral gyrus, right frontal operculum, and right middle frontal gyrus. 4. Late subacute infarct in the lateral right occipital lobe. 5. Stable background of confluent periventricular and subcortical T2 hyperintensities bilaterally, advanced for age. This likely reflects the sequela of chronic microvascular ischemia. These results were called by telephone at the time of interpretation on 10/19/2023 at 4:31 pm to provider Marriah Sanderlin [facility] , who verbally acknowledged these results. Electronically Signed   By: Audree Leas M.D.   On: 10/19/2023 16:32    Pertinent labs & imaging results that were available during my care of the  patient were reviewed by me and considered in my medical decision making (see MDM for details).  Medications Ordered in ED Medications   stroke: early stages of recovery book (has no administration in time range)  aspirin  chewable tablet 81 mg (81 mg Oral Given 10/19/23 1748)  clopidogrel (PLAVIX) tablet 75 mg (75 mg Oral Given 10/19/23 1748)  lactated ringers  bolus 500 mL (0 mLs Intravenous Stopped 10/19/23 1341)  LORazepam  (ATIVAN ) injection 0.5 mg (0.5 mg Intravenous Given 10/19/23 1500)                                                                                                                                     Procedures Procedures  (including critical care time)  Medical Decision Making / ED Course   This patient presents to the ED for concern of weakness, this involves an extensive number of treatment options, and is a complaint that carries with it a high risk of complications and morbidity.  The differential diagnosis includes CVA, hemorrhagic stroke, mass, Todd's paralysis, seizure, electrolyte abnormality, encephalopathy, complicated migraine  MDM: Patient seen emergency room for evaluation of generalized weakness.  Physical exam with left upper and left lower extremity weakness that appears to be new.  Unfortunately patient is outside the window for TNK and thus stroke alert not called.  Laboratory evaluation with a hemoglobin of  11.8, sodium 131, BUN 42, creatinine 2.57, albumin 2.9, urinalysis unremarkable.  MRI concerning for multiple strokes the newest being in the right frontal lobe.  Neurology consulted who is recommending medical admission and completion of stroke workup.  Patient admitted   Additional history obtained: -Additional history obtained from daughter -External records from outside source obtained and reviewed including: Chart review including previous notes, labs, imaging, consultation notes   Lab Tests: -I ordered, reviewed, and interpreted labs.   The  pertinent results include:   Labs Reviewed  COMPREHENSIVE METABOLIC PANEL WITH GFR - Abnormal; Notable for the following components:      Result Value   Sodium 131 (*)    Chloride 91 (*)    Glucose, Bld 202 (*)    BUN 42 (*)    Creatinine, Ser 2.57 (*)    Total Protein 6.1 (*)    Albumin 2.9 (*)    GFR, Estimated 18 (*)    All other components within normal limits  CBC WITH DIFFERENTIAL/PLATELET - Abnormal; Notable for the following components:   Hemoglobin 11.8 (*)    Platelets 591 (*)    Lymphs Abs 4.2 (*)    All other components within normal limits  URINALYSIS, ROUTINE W REFLEX MICROSCOPIC - Abnormal; Notable for the following components:   Specific Gravity, Urine 1.004 (*)    Glucose, UA 50 (*)    Protein, ur 100 (*)    Bacteria, UA RARE (*)    All other components within normal limits  LIPID PANEL      EKG   EKG Interpretation Date/Time:  Saturday Oct 19 2023 10:47:22 EDT Ventricular Rate:  79 PR Interval:  245 QRS Duration:  118 QT Interval:  430 QTC Calculation: 493 R Axis:   -60  Text Interpretation: Sinus rhythm Incomplete left bundle branch block LVH with secondary repolarization abnormality Anterior Q waves, possibly due to LVH Confirmed by Yerick Eggebrecht ([id number]) on 10/19/2023 11:17:55 AM         Imaging Studies ordered: I ordered imaging studies including MRI brain I independently visualized and interpreted imaging. I agree with the radiologist interpretation   Medicines ordered and prescription drug management: Meds ordered this encounter  Medications   lactated ringers  bolus 500 mL   LORazepam  (ATIVAN ) injection 0.5 mg    stroke: early stages of recovery book   aspirin  chewable tablet 81 mg   clopidogrel (PLAVIX) tablet 75 mg    -I have reviewed the patients home medicines and have made adjustments as needed  Critical interventions none  Consultations Obtained: I requested consultation with the stroke neurologist Dr. Lindzen,  and  discussed lab and imaging findings as well as pertinent plan - they recommend: Medical admission   Cardiac Monitoring: The patient was maintained on a cardiac monitor.  I personally viewed and interpreted the cardiac monitored which showed an underlying rhythm of: NSR  Social Determinants of Health:  Factors impacting patients care include: Lives in skilled nursing facility   Reevaluation: After the interventions noted above, I reevaluated the patient and found that they have :stayed the same  Co morbidities that complicate the patient evaluation  Past Medical History:  Diagnosis Date   Anxiety 09/13/2015   Arthritis    Atypical chest pain 01/15/2021   Blackhead 10/24/2021   Bradycardia 07/17/2016   Formatting of this note might be different from the original.  Last Assessment & Plan:   Found to be bradycardic at outside hospital. They stopped metoprolol  and verapamil  and heart rate  has been normal since then. Echo appears to have been reassuring at the outside facility. Appears to be doing much better with regards to this. She'll continue to monitor for recurrent symptoms. 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  Last Assessment & Plan:   Continues to have issues with this.  She is following with a specialist and it sounds as though they are planning on an MRI.  Benign exam today.   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 Rudean Corrente of this note might be different from the original.  Dr. Zelda Hickman      Last Assessment & Plan:   Recheck kidney function today.   Constipation 11/17/2019   Cough 01/25/2016   Formatting of this note might be different from the original.  Last Assessment & Plan:   Minimal nighttime cough. Improves when she washes her pillows consistently. Suspect allergies contributing. She'll monitor.   Diabetes mellitus without complication  (HCC)    one elevated reading/ no treatment   Disorder of rotator cuff 06/24/2018   Diverticulitis    Dysuria 03/22/2017   Formatting of this note might be different from the original.  Last Assessment & Plan:   Symptoms concerning for UTI.  Will check urinalysis.   Edema of foot 04/08/2014   Formatting of this note might be different from the original.  Last Assessment & Plan:   Chronic pedal edema. Suspect venous insufficiency. No orthopnea or shortness of breath. Advised elevation of her legs. Consider compression stockings in the future.   Elevated troponin 12/15/2020   Essential hypertension 04/08/2014   Formatting of this note might be different from the original.  Last Assessment & Plan:   Well-controlled on recheck.  Continue current regimen.   Fall 03/26/2018   Gastroenteritis 08/12/2021   Gastrointestinal hemorrhage 08/03/2015   GI bleed    High cholesterol    History of blood transfusion    Hyperglycemia due to type 2 diabetes mellitus (HCC) 03/11/2015   Hypertension    Hypertensive urgency 12/15/2020   Low back pain 04/08/2014   Morbid obesity (HCC) 04/08/2014   Formatting of this note might be different from the original.  Last Assessment & Plan:   Weight is stable. Discussed diet and exercise at length. Encouraged whatever exercise she can do. Given diet information.   Palpitations 12/15/2020   Peripheral edema 12/22/2019   Postmenopausal bleeding 07/17/2016   Formatting of this note might be different from the original.  Last Assessment & Plan:   Recent D&C. Following with gynecology. Benign findings. Monitor for recurrence.   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  Last Assessment & Plan:   Continues to have issues with this.  She is following with a specialist and it sounds as though they are planning on an MRI.  Benign  exam today.   Recurrent falls 08/13/2019   Renal insufficiency    Skin cyst 10/24/2021   Stage 3a chronic kidney disease (HCC) 01/15/2021   Swelling of lower leg 07/19/2016   Vertigo 10/13/2015   Formatting of this note might be different from the original.  Last Assessment & Plan:   No recurrence. Discussed that meclizine  is an as needed medication and that she does not  need to take this daily. She will continue to monitor.      Dispostion: I considered admission for this patient, and given new stroke patient require hospital admission     Final Clinical Impression(s) / ED Diagnoses Final diagnoses:  Cerebrovascular accident (CVA), unspecified mechanism [facility])     @PCDICTATION @    Karlyn Overman, MD 10/19/23 [phone]

## 2023-10-19 NOTE — H&P (Addendum)
 History and Physical    Patient: Alexis Farmer ZOX:[id number] DOB: 04-Apr-1941 DOA: 10/19/2023 DOS: the patient was seen and examined on 10/19/2023 PCP: Valli Gaw, MD  Patient coming from: SNF. Chief complaint: No chief complaint on file.  HPI:  Alexis Farmer is a 83 y.o. female with past medical history  of  monoclonal B-cell lymphoma, stage IIIb CKD, DM, HTN, HFpEF, chronic pain, obesity, presenting with numbness and weakness in left lower extremity difficulty walking.  Last known well was 1900 yesterday currently patient reports bilateral leg numbness and right hand numbness.  She was recently discharged after being admitted for congestive heart failure exacerbation.  No reports of chest pain shortness of breath fevers nausea vomiting diarrhea.  Patient is on Cipro  and or thrombolytic therapy with TNKase code stroke not activated.  ED Course: Pt in ed at bedside  is returning from MRI is alert awake oriented. Vital signs in the ED were notable for the following:  Vitals:   10/19/23 1630 10/19/23 1745 10/19/23 1800 10/19/23 1815  BP: 117/67 (!) 134/116 (!) 130/101 124/77  Pulse: 66  76 74  Temp:      Resp: 15 11 20 12   SpO2: 100%  100% 99%  TempSrc:      >>ED evaluation thus far shows: EKG sinus rhythm 79 with an incomplete left bundle branch block LVH QTc 493 PR 245. -CMP shows sodium of 131 glucose 2 2 BUN of 42 AKI on CKD with creatinine of 2.57 EGFR of 18 normal LFTs and electrolytes otherwise. -CBC shows anemia 11.8 normal white count and platelet count of 591. -MRI shows acute infarct in the right frontal lobe and the left parietal lobe along with punctate cortical infarcts in the right Precentral gyrus right frontal operculum and right middle frontal gyrus and late subacute infarct in the lateral right occipital lobe.  >>While in the ED patient received the following: Medications   stroke: early stages of recovery book (has no administration in time range)  aspirin   chewable tablet 81 mg (81 mg Oral Given 10/19/23 1748)  clopidogrel (PLAVIX) tablet 75 mg (75 mg Oral Given 10/19/23 1748)  lactated ringers  bolus 500 mL (0 mLs Intravenous Stopped 10/19/23 1341)  LORazepam  (ATIVAN ) injection 0.5 mg (0.5 mg Intravenous Given 10/19/23 1500)   Review of Systems  Neurological:  Positive for weakness.       Difficulty with gait.   Past Medical History:  Diagnosis Date   Anxiety 09/13/2015   Arthritis    Atypical chest pain 01/15/2021   Blackhead 10/24/2021   Bradycardia 07/17/2016   Formatting of this note might be different from the original.  Last Assessment & Plan:   Found to be bradycardic at outside hospital. They stopped metoprolol  and verapamil  and heart rate has been normal since then. Echo appears to have been reassuring at the outside facility. Appears to be doing much better with regards to this. She'll continue to monitor for recurrent symptoms. 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  Last Assessment & Plan:   Continues to have issues with this.  She is following with a specialist and it sounds as though they are planning on an MRI.  Benign exam today.   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 Rudean Corrente of this note might be different from the original.  Dr. Zelda Hickman  Last Assessment & Plan:   Recheck kidney function today.   Constipation 11/17/2019   Cough 01/25/2016   Formatting of this note might be different from the original.  Last Assessment & Plan:   Minimal nighttime cough. Improves when she washes her pillows consistently. Suspect allergies contributing. She'll monitor.   Diabetes mellitus without complication (HCC)    one elevated reading/ no treatment   Disorder of rotator cuff 06/24/2018   Diverticulitis    Dysuria 03/22/2017   Formatting of this note might be different from the original.  Last  Assessment & Plan:   Symptoms concerning for UTI.  Will check urinalysis.   Edema of foot 04/08/2014   Formatting of this note might be different from the original.  Last Assessment & Plan:   Chronic pedal edema. Suspect venous insufficiency. No orthopnea or shortness of breath. Advised elevation of her legs. Consider compression stockings in the future.   Elevated troponin 12/15/2020   Essential hypertension 04/08/2014   Formatting of this note might be different from the original.  Last Assessment & Plan:   Well-controlled on recheck.  Continue current regimen.   Fall 03/26/2018   Gastroenteritis 08/12/2021   Gastrointestinal hemorrhage 08/03/2015   GI bleed    High cholesterol    History of blood transfusion    Hyperglycemia due to type 2 diabetes mellitus (HCC) 03/11/2015   Hypertension    Hypertensive urgency 12/15/2020   Low back pain 04/08/2014   Morbid obesity (HCC) 04/08/2014   Formatting of this note might be different from the original.  Last Assessment & Plan:   Weight is stable. Discussed diet and exercise at length. Encouraged whatever exercise she can do. Given diet information.   Palpitations 12/15/2020   Peripheral edema 12/22/2019   Postmenopausal bleeding 07/17/2016   Formatting of this note might be different from the original.  Last Assessment & Plan:   Recent D&C. Following with gynecology. Benign findings. Monitor for recurrence.   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  Last Assessment & Plan:   Continues to have issues with this.  She is following with a specialist and it sounds as though they are planning on an MRI.  Benign exam today.   Recurrent falls 08/13/2019   Renal insufficiency    Skin cyst 10/24/2021   Stage 3a chronic kidney disease (HCC) 01/15/2021   Swelling of lower leg 07/19/2016   Vertigo  10/13/2015   Formatting of this note might be different from the original.  Last Assessment & Plan:   No recurrence. Discussed that meclizine  is an as needed medication and that she does not need to take this daily. She will continue to monitor.   Past Surgical History:  Procedure Laterality Date   APPENDECTOMY     CHOLECYSTECTOMY     ECTOPIC PREGNANCY SURGERY     EYE SURGERY     bilateral cataracts   EYE SURGERY     02/11/2019 repair hole in right eye    gallbladder      HYSTEROSCOPY WITH D & C N/A 10/26/2016   Procedure: DILATATION AND CURETTAGE /HYSTEROSCOPY;  Surgeon: Prescilla Brod, MD;  Location: [facility];  Service: Gynecology;  Laterality: N/A;   HYSTEROSCOPY WITH D & C N/A 07/07/2018   Procedure: DILATATION AND CURETTAGE /HYSTEROSCOPY;  Surgeon: Prescilla Brod, MD;  Location: [facility];  Service: Gynecology;  Laterality: N/A;  RIGHT HEART CATH Bilateral 10/10/2023   Procedure: RIGHT HEART CATH;  Surgeon: Mardell Shade, MD;  Location: [facility];  Service: Cardiovascular;  Laterality: Bilateral;   THYROIDECTOMY, PARTIAL      reports that she has never smoked. She has never used smokeless tobacco. She reports that she does not drink alcohol and does not use drugs. Allergies  Allergen Reactions   Celexa  [Citalopram ]     Diarrhea upset stomach    Jardiance  [Empagliflozin ] Other (See Comments)    Reaction not recalled   Norvasc  [Amlodipine ]     Leg edema   Tape Other (See Comments)    Leaves the skin "raw" if left on for a period of time- tolerates paper tape   Penicillin V Rash   Penicillin V Potassium Rash   Penicillins Rash    Has patient had a PCN reaction causing immediate rash, facial/tongue/throat swelling, SOB or lightheadedness with hypotension: Yes Has patient had a PCN reaction causing severe rash involving mucus membranes or skin necrosis: No Has patient had a PCN reaction that required hospitalization No Has patient had a PCN reaction occurring  within the last 10 years: Yes If all of the above answers are "NO", then may proceed with Cephalosporin use.    Family History  Problem Relation Age of Onset   Diabetes Mother    Hypertension Mother    Stroke Mother    Diabetes Other    Healthy Father    Diabetes Sister    Heart disease Sister    Prior to Admission medications   Medication Sig Start Date End Date Taking? Authorizing Provider  acetaminophen  (TYLENOL ) 500 MG tablet Take 500 mg by mouth every 6 (six) hours as needed for mild pain or headache.   Yes [provider]  Ascorbic Acid (VITAMIN C PO) Take 1 tablet by mouth daily.   Yes [provider]  aspirin  EC 81 MG tablet Take 81 mg by mouth 2 (two) times a week. Swallow whole.   Yes [provider]  benzonatate  (TESSALON ) 200 MG capsule Take 200 mg by mouth 3 (three) times daily as needed for cough.   Yes [provider]  carvedilol  (COREG ) 6.25 MG tablet Take 1 tablet (6.25 mg total) by mouth 2 (two) times daily with a meal. 10/15/23  Yes Djan, Esther Hem, MD  gabapentin  (NEURONTIN ) 300 MG capsule Take 300 mg by mouth at bedtime. 12/25/17  Yes [provider]  guaiFENesin (SCOT-TUSSIN EXPECTORANT) 100 MG/5ML liquid Take 20 mLs by mouth 3 (three) times daily as needed for cough. 07/26/23  Yes [provider]  hydrALAZINE  (APRESOLINE ) 100 MG tablet Take 1 tablet (100 mg total) by mouth 3 (three) times daily. TAKE 1 TABLET (100 MG TOTAL) BY MOUTH 4 (FOUR) TIMES DAILY. Must keep upcoming appointment on 10/21/23 for future refills. Patient taking differently: Take 100 mg by mouth in the morning, at noon, in the evening, and at bedtime. TAKE 1 TABLET (100 MG TOTAL) BY MOUTH 4 (FOUR) TIMES DAILY. Must keep upcoming appointment on 10/21/23 for future refills. 10/15/23  Yes Djan, Esther Hem, MD  insulin  NPH Human (NOVOLIN N) 100 UNIT/ML injection Inject 15-20 Units into the skin 2 (two) times daily before a meal. Inject 20 units in the morning an  15 units in the evening. 06/08/19  Yes [provider]  insulin  regular (NOVOLIN R) 100 units/mL injection Inject 0-0.08 mLs (0-8 Units total) into the skin 3 (three) times daily before meals. 10/17/20  Yes  Gwyndolyn Lerner, MD  ipratropium-albuterol  (DUONEB) 0.5-2.5 (3) MG/3ML SOLN Take 3 mLs by nebulization 4 (four) times daily. Take scheduled 4 times daily until wheezing and shortness of breath from RSV improves. Then take as needed.   Yes [provider]  LORazepam  (ATIVAN ) 1 MG tablet Take 0.5 tablets (0.5 mg total) by mouth daily as needed for anxiety. Inform pt 0.5 dose on backorder has to cut this in 1/2 dose 09/03/23  Yes Valli Gaw, MD  Multiple Vitamins-Minerals (CENTRUM SILVER 50+WOMEN) TABS Take 1 tablet by mouth daily with breakfast.   Yes [provider]  polyethylene glycol powder (GLYCOLAX /MIRALAX ) 17 GM/SCOOP powder Take 17 g by mouth daily as needed for moderate constipation or severe constipation. Can take up to 2x per day prn. Mix with 8 ounces of liquid 03/21/22  Yes McLean-Scocuzza, Karon Packer, MD  rosuvastatin  (CRESTOR ) 20 MG tablet TAKE 1 TABLET BY MOUTH EVERY DAY 03/21/22  Yes McLean-Scocuzza, Karon Packer, MD  senna-docusate (SENOKOT-S) 8.6-50 MG tablet Take 1 tablet by mouth 2 (two) times daily. 10/15/23  Yes Ezzard Holms, MD  spironolactone  (ALDACTONE ) 25 MG tablet Take 25 mg by mouth 3 (three) times a week. Monday Wednesday and friday   Yes [provider]  torsemide  (DEMADEX ) 10 MG tablet Take 2 tablets (20 mg total) by mouth daily. 10/15/23  Yes Ezzard Holms, MD  traZODone  (DESYREL ) 50 MG tablet Take 1 tablet (50 mg total) by mouth at bedtime as needed for sleep. 10/15/23  Yes Ezzard Holms, MD  blood glucose meter kit and supplies KIT Accu chek, Dx code E11.65, check 3 times daily 07/26/17   Kent Pear, MD  Blood Pressure KIT 1 Device by Does not apply route daily. 10/24/21   McLean-Scocuzza, Karon Packer, MD  glucose blood (ACCU-CHEK GUIDE) test  strip USE AS INSTRUCTED 3 TIMES DAILY 09/06/22   Valli Gaw, MD  Insulin  Syringe-Needle U-100 (BD INSULIN  SYRINGE U/F) 30G X 1/2" 0.5 ML MISC USE UP TO 5X PER DAY WITH INSULIN  2X (NPH) AND 3X (REGULAR) 12/18/22   Valli Gaw, MD                                                                                 Vitals:   10/19/23 1630 10/19/23 1745 10/19/23 1800 10/19/23 1815  BP: 117/67 (!) 134/116 (!) 130/101 124/77  Pulse: 66  76 74  Resp: 15 11 20 12   Temp:      TempSrc:      SpO2: 100%  100% 99%   Physical Exam Vitals and nursing note reviewed.  Constitutional:      General: She is not in acute distress. HENT:     Head: Normocephalic and atraumatic.     Right Ear: Hearing normal.     Left Ear: Hearing normal.     Nose: No nasal deformity.     Mouth/Throat:     Lips: Pink.  Eyes:     General: Lids are normal.     Extraocular Movements: Extraocular movements intact.  Cardiovascular:     Rate and Rhythm: Normal rate and regular rhythm.     Heart sounds: Normal heart sounds.  Pulmonary:  Effort: Pulmonary effort is normal. No tachypnea, bradypnea, accessory muscle usage or respiratory distress.     Breath sounds: Examination of the left-lower field reveals rales. Rales present. No wheezing or rhonchi.    Abdominal:     General: Bowel sounds are normal. There is no distension.     Palpations: Abdomen is soft. There is no mass.     Tenderness: There is no abdominal tenderness.  Musculoskeletal:     Right lower leg: No edema.     Left lower leg: No edema.  Skin:    General: Skin is warm.  Neurological:     Mental Status: She is alert and oriented to person, place, and time.     Cranial Nerves: Cranial nerves 2-12 are intact. No cranial nerve deficit.     Sensory: No sensory deficit.     Motor: Weakness present.     Coordination: Finger-Nose-Finger Test normal.     Comments: Exam of leg limited due to pain. Left leg weakness.   Psychiatric:        Attention and  Perception: Attention normal.        Mood and Affect: Mood normal.        Speech: Speech normal.        Behavior: Behavior normal. Behavior is cooperative.     Labs on Admission: I have personally reviewed following labs and imaging studies CBC: Recent Labs  Lab 10/19/23 1109  WBC 8.6  NEUTROABS 3.4  HGB 11.8*  HCT 36.8  MCV 89.1  PLT 591*   Basic Metabolic Panel: Recent Labs  Lab 10/13/23 0605 10/14/23 0412 10/15/23 0556 10/19/23 1109  NA 130* 134* 133* 131*  K 3.5 4.1 4.0 4.0  CL 93* 94* 93* 91*  CO2 32 33* 29 28  GLUCOSE 166* 187* 220* 202*  BUN 38* 40* 37* 42*  CREATININE 2.55* 2.21* 2.01* 2.57*  CALCIUM  8.3* 8.6* 9.0 9.1   GFR: Estimated Creatinine Clearance: 18.6 mL/min (A) (by C-G formula based on SCr of 2.57 mg/dL (H)). Liver Function Tests: Recent Labs  Lab 10/19/23 1109  AST 34  ALT 20  ALKPHOS 44  BILITOT 0.7  PROT 6.1*  ALBUMIN 2.9*   CBG: Recent Labs  Lab 10/14/23 1153 10/14/23 1715 10/14/23 1942 10/15/23 0811 10/15/23 1207  GLUCAP 214* 195* 216* 243* 426*   Urine analysis:    Component Value Date/Time   COLORURINE YELLOW 10/19/2023 1235   APPEARANCEUR CLEAR 10/19/2023 1235   APPEARANCEUR Clear 10/22/2018 0844   LABSPEC 1.004 (L) 10/19/2023 1235   PHURINE 7.0 10/19/2023 1235   GLUCOSEU 50 (A) 10/19/2023 1235   HGBUR NEGATIVE 10/19/2023 1235   BILIRUBINUR NEGATIVE 10/19/2023 1235   BILIRUBINUR Negative 10/22/2018 0844   KETONESUR NEGATIVE 10/19/2023 1235   PROTEINUR 100 (A) 10/19/2023 1235   UROBILINOGEN 0.2 03/09/2020 1022   NITRITE NEGATIVE 10/19/2023 1235   LEUKOCYTESUR NEGATIVE 10/19/2023 1235   Radiological Exams on Admission: MR ANGIO HEAD WO CONTRAST Result Date: 10/19/2023 CLINICAL DATA:  Stroke, follow up EXAM: MRA NECK WITHOUT CONTRAST MRA HEAD WITHOUT CONTRAST TECHNIQUE: Angiographic images of the Circle of Willis were acquired using MRA technique without intravenous contrast. COMPARISON:  CTA head/neck May 6, 24.  FINDINGS: MRA NECK FINDINGS Motion limited. Aortic arch: Great vessel origins are patent. Right carotid system: Patent with likely at least moderate stenosis of the proximal ICA. Motion limits assessment. Left carotid system: Motion limited evaluation without definite hemodynamically significant stenosis. Vertebral arteries: Absence of flow related signal in the right  vertebral artery, suggesting occlusion or proximal high-grade stenosis. Left vertebral artery appears patent without significant stenosis. MRA HEAD FINDINGS Anterior circulation: Bilateral intracranial ICAs, MCAs and ACAs are patent without proximal hemodynamically significant stenosis. Posterior circulation: Diminished but preserved flow related signal in the right intradural vertebral artery. Left intradural vertebral artery is patent without significant stenosis. Basilar artery and bilateral posterior cerebral arteries are patent without proximal hemodynamically significant stenosis. IMPRESSION: 1. Absence of flow related signal in the right vertebral artery, suggesting occlusion or proximal high-grade stenosis. Reconstitution intradurally. 2. 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 3. Given the above, consider CTA to better confirm and better assess. 4. No large vessel occlusion or proximal hemodynamically significant stenosis in the head. Electronically Signed   By: Stevenson Elbe M.D.   On: 10/19/2023 19:16   MR ANGIO NECK WO CONTRAST Result Date: 10/19/2023 CLINICAL DATA:  Stroke, follow up EXAM: MRA NECK WITHOUT CONTRAST MRA HEAD WITHOUT CONTRAST TECHNIQUE: Angiographic images of the Circle of Willis were acquired using MRA technique without intravenous contrast. COMPARISON:  CTA head/neck May 6, 24. FINDINGS: MRA NECK FINDINGS Motion limited. Aortic arch: Great vessel origins are patent. Right carotid system: Patent with likely at least moderate stenosis of the proximal ICA. Motion  limits assessment. Left carotid system: Motion limited evaluation without definite hemodynamically significant stenosis. Vertebral arteries: Absence of flow related signal in the right vertebral artery, suggesting occlusion or proximal high-grade stenosis. Left vertebral artery appears patent without significant stenosis. MRA HEAD FINDINGS Anterior circulation: Bilateral intracranial ICAs, MCAs and ACAs are patent without proximal hemodynamically significant stenosis. Posterior circulation: Diminished but preserved flow related signal in the right intradural vertebral artery. Left intradural vertebral artery is patent without significant stenosis. Basilar artery and bilateral posterior cerebral arteries are patent without proximal hemodynamically significant stenosis. IMPRESSION: 1. Absence of flow related signal in the right vertebral artery, suggesting occlusion or proximal high-grade stenosis. Reconstitution intradurally. 2. 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 3. Given the above, consider CTA to better confirm and better assess. 4. No large vessel occlusion or proximal hemodynamically significant stenosis in the head. Electronically Signed   By: Stevenson Elbe M.D.   On: 10/19/2023 19:16   MR BRAIN WO CONTRAST Result Date: 10/19/2023 CLINICAL DATA:  Neuro deficit, acute, stroke suspected. Numbness and weakness in the left lower extremity. Abnormal gait. Last known well at 7 p.m. yesterday. EXAM: MRI HEAD WITHOUT CONTRAST TECHNIQUE: Multiplanar, multiecho pulse sequences of the brain and surrounding structures were obtained without intravenous contrast. COMPARISON:  CT head without contrast 08/03/2023. MR head without contrast 11/27/2022. FINDINGS: Brain: Acute nonhemorrhagic cortical infarct is present in the medial right frontal lobe. T2 and FLAIR signal hyperintensities are associated. A 10 mm cortical infarct is present in the medial left  parietal lobe. Punctate cortical infarct is present in the right precentral gyrus on image 39 of series 2. Additional punctate infarcts are present in the right frontal operculum and right middle frontal gyrus. Separate from the acute/subacute infarcts is a stable. Confluent periventricular and subcortical T2 hyperintensities, advanced for age. A late subacute infarct is present in the lateral right occipital lobe. No acute hemorrhage or mass lesion is present. The ventricles are proportionate to the degree of atrophy. No significant extraaxial fluid collection is present. Vascular: Flow is present in the major intracranial arteries. Skull and upper cervical spine: The craniocervical junction is  normal. Upper cervical spine is within normal limits. Marrow signal is unremarkable. Sinuses/Orbits: The paranasal sinuses and mastoid air cells are clear. Bilateral lens replacements are noted. Globes and orbits are otherwise unremarkable. IMPRESSION: 1. 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 2. 10 mm cortical infarct in the medial left parietal lobe. 3. Punctate cortical infarcts in the right precentral gyrus, right frontal operculum, and right middle frontal gyrus. 4. Late subacute infarct in the lateral right occipital lobe. 5. Stable background of confluent periventricular and subcortical T2 hyperintensities bilaterally, advanced for age. This likely reflects the sequela of chronic microvascular ischemia. These results were called by telephone at the time of interpretation on 10/19/2023 at 4:31 pm to provider [name] [facility] , who verbally acknowledged these results. Electronically Signed   By: Audree Leas M.D.   On: 10/19/2023 16:32   Data Reviewed: Relevant notes from primary care and specialist visits, past discharge summaries as available in EHR, including Care Everywhere. Prior diagnostic testing as pertinent to current admission diagnoses, Updated medications and problem  lists for reconciliation ED course, including vitals, labs, imaging, treatment and response to treatment,Triage notes, nursing and pharmacy notes and ED provider's notes Notable results as noted in HPI.Discussed case with EDMD/ ED APP/ or Specialty MD on call and as needed.  Assessment & Plan  >> Left sided weakness/ Acute CVA: Pt was having s/s since last night and today am was not able to get out of bed.  Today found to have acute CVA on her MRI and per neurology recommendation patient on dual antiplatelet therapy.  Appreciate stroke team management.  Bilateral carotid ultrasound pending.  Blood pressure goal systolic of 150s 478G.  Neurochecks every 4 TFTs lipid panel A1c. Patient was admitted earlier at [facility] for CHF exacerbation where she had a right heart cath and to 2D echocardiogram without imaging enhancing agent that showed elevated left atrial pressures grade 1 diastolic dysfunction and in no atrial level shunt detected by color-flow Doppler.  >> Essential hypertension: Vitals:   10/19/23 1047 10/19/23 1100 10/19/23 1115 10/19/23 1130  BP: (!) 93/51 99/66 92/75  106/64   10/19/23 1208 10/19/23 1500 10/19/23 1630 10/19/23 1745  BP: (!) 184/62 (!) 180/71 117/67 (!) 134/116   10/19/23 1800 10/19/23 1815  BP: (!) 130/101 124/77  To avoid blood pressure fluctuation I have currently held the patient's torsemide  Aldactone  also in light of her worsening CKD.  I started patient on low-dose hydralazine  scheduled with lower dose Coreg  scheduled with parameters to hold if blood pressure is 150 or below.  Will defer to neurology to modify if needed.  >> Obesity with a BMI of 39.99/diabetes mellitus type 2: Home regimen of insulin  is currently held and will manage with every 4 hour Accu-Cheks and coverage with moderate level insulin  scale.   >> Chronic diastolic dysfunction: Diuretics on as needed basis.  Currently torsemide  is held.  Strict I's and O's Daily weights. Continue with her statin  therapy and Coreg .   >> AKI on CKD stage IIIb: Lab Results  Component Value Date   CREATININE 2.57 (H) 10/19/2023   CREATININE 2.01 (H) 10/15/2023   CREATININE 2.21 (H) 10/14/2023  2/2 to diuretic use.  Renally dose medications and avoid contrast.  >> Postoperative Hypothyroidism Free T4 and TSH.   >>Deconditioning and High risk for falls: Pt is motivated and may do well with aggressive rehab.   >>Hyponatremia:     Latest Ref Rng & Units 10/19/2023   11:09 AM 10/15/2023  5:56 AM 10/14/2023    4:12 AM  BMP  Glucose 70 - 99 mg/dL 841  660  630   BUN 8 - 23 mg/dL 42  37  40   Creatinine 0.44 - 1.00 mg/dL 1.60  1.09  3.23   Sodium 135 - 145 mmol/L 131  133  134   Potassium 3.5 - 5.1 mmol/L 4.0  4.0  4.1   Chloride 98 - 111 mmol/L 91  93  94   CO2 22 - 32 mmol/L 28  29  33   Calcium  8.9 - 10.3 mg/dL 9.1  9.0  8.6   Mild and 2/2 to torsemide .    DVT prophylaxis:  Heparin  5000 q 12h.  Consults:  Neurology.  Advance Care Planning:    Code Status: Prior   Family Communication:  None . Disposition Plan:  Rehab . Severity of Illness: The appropriate patient status for this patient is INPATIENT. Inpatient status is judged to be reasonable and necessary in order to provide the required intensity of service to ensure the patient's safety. The patient's presenting symptoms, physical exam findings, and initial radiographic and laboratory data in the context of their chronic comorbidities is felt to place them at high risk for further clinical deterioration. Furthermore, it is not anticipated that the patient will be medically stable for discharge from the hospital within 2 midnights of admission.   * I certify that at the point of admission it is my clinical judgment that the patient will require inpatient hospital care spanning beyond 2 midnights from the point of admission due to high intensity of service, high risk for further deterioration and high frequency of surveillance  required.*  Unresulted Labs (From admission, onward)     Start     Ordered   10/20/23 0500  Lipid panel  (Labs)  Tomorrow morning,   R       Comments: Fasting    10/19/23 1724   10/19/23 2001  TSH  Add-on,   AD        10/19/23 2000   10/19/23 2001  T4, free  Add-on,   AD        10/19/23 2000   10/19/23 2001  Hemoglobin A1c  Add-on,   AD        10/19/23 2000            Orders Placed This Encounter  Procedures   MR BRAIN WO CONTRAST   MR ANGIO HEAD WO CONTRAST   MR ANGIO NECK WO CONTRAST   Comprehensive metabolic panel   CBC with Differential   Urinalysis, Routine w reflex microscopic -Urine, Clean Catch   Lipid panel   TSH   T4, free   Hemoglobin A1c   Diet NPO time specified   NIHSS score documentation NIHSS score range: 0-42   Vital signs   Notify physician (specify)   Swallow screen - If patient does NOT pass this screen, place order for SLP eval and treat (SLP2) - swallowing evaluation (BSE, MBS and/or diet order as indicated)   RN to notify Physician for appropriate diet after patient passes swallow screen   NIH Stroke Scale   Intake and output   Apply Stroke Care Plan: Ischemic Stroke, TIA   Initiate Adult Central Line Maintenance and Catheter Protocol for patients with central line (CVC, PICC, Port, Hemodialysis, Trialysis)   Discuss with patient and document patient's goals for stroke risk factor reduction   Initiate Oral Care Protocol   Initiate Carrier Fluid Protocol  Provide stroke education material to patient and family   Nurse to provide smoking / tobacco cessation education   Cardiac Monitoring - Continuous Indefinite   Strict intake and output   Care order/instruction: PLACE PUREWICK PT IS FALL RISK AND CODE STROKE.   Apply Diabetes Mellitus Care Plan   STAT CBG when hypoglycemia is suspected. If treated, recheck every 15 minutes after each treatment until CBG >/= 70 mg/dl   Refer to Hypoglycemia Protocol Sidebar Report for treatment of CBG < 70  mg/dl   No HS correction Insulin    Advance diet as tolerated   Neuro checks   Consult to neurology   Consult to hospitalist   OT eval and treat   PT eval and treat   Oxygen therapy Mode or (Route): Nasal cannula; Liters Per Minute: 2; Keep O2 saturation between: greater than 94 %   Pulse oximetry, continuous   SLP eval and treat Reason for evaluation: Cognitive/Language evaluation   EKG 12-Lead   Admit to Inpatient (patient's expected length of stay will be greater than 2 midnights or inpatient only procedure)   Fall precautions   Aspiration precautions   VAS US  CAROTID    Author: Lavanda Porter, MD 12 pm -8 pm. 10/19/2023 8:16 PM >>Please note for any concern,or critical results after hours past 8pm please contact the [facility] hospitalist [facility] floor coverage provider from 7 PM- 7 AM. For on call review [email or web address], username [email or web address] and PW: your phone number<<

## 2023-10-19 NOTE — Progress Notes (Deleted)
 Cardiology Office Note  Date:  10/19/2023   ID:  Alexis Farmer, DOB 1940/07/28, MRN [id number]  PCP:  Alexis Gaw, MD   No chief complaint on file.   HPI:  Alexis Farmer is a 83 y.o. woman with history of  obesity,  insulin -dependent diabetes, Hemoglobin A1c >9 poor diet, hypertension,  hyperlipidemia,  moderate LVH  hospital 08/13/2014  chest pain, malignant hypertension, possible sleep apnea,  Anxiety Notes indicate previous history of GI bleeding  colonoscopy which was normal by her report Chronic lower extremity edema Chronic renal insufficiency Morbid obesity Right subclavian stenosis She presents to for follow-up of her hypertension, chronic diastolic CHF, poorly controlled diabetes, CKD  Last office visit with myself 1/24    Out of bystolic , pharmacy does not have it She brings blood pressure readings with her typically running 140 up to 160 systolic She feels these numbers are adequate and there is not as high as they were several years ago Legs are weak, sedentary, no regular walking program  Lab work reviewed June 2023 A1c 9.6 in June 2023 LDL 46, total chol 129  Hospitalization February 2023, ileus Nausea vomiting diarrhea abdominal pain/gastroenteritis  EKG personally reviewed by myself on todays visit Normal sinus rhythm rate 65 bpm poor R wave progression to the anterior precordial leads, left axis deviation, no change compared to 2022  Other past medical history reviewed Followed by vascular in [facility] Carotid u/s  Normal flow hemodynamics were seen in the left subclavian  artery. The right subclavian artery has dampened monophasic flow,  retrograde vertebral artery flow, as well as difference in  brachial  artery pressuress suggestive of right subclavian steal   hospital end of June 2022 Symptoms of anxiety.  Patient reports that worsening tinnitus, lightheadedness and flushed.  She also report urinary frequency, urgency lower extremity edema,  mild nausea.  Troponin of 25 felt secondary to elevated blood pressure  echocardiogram reviewed from June 2022   1. Left ventricular ejection fraction, by estimation, is 60 to 65%. The  left ventricle has normal function. The left ventricle has no regional  wall motion abnormalities. There is moderate concentric left ventricular  hypertrophy. Indeterminate diastolic  filling due to E-A fusion.   2. Right ventricular systolic function is normal. The right ventricular  size is normal. Tricuspid regurgitation signal is inadequate for assessing  PA pressure.   CT chest  Calcified coronary artery and aortic atherosclerosis. 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 Negative for thoracic aortic aneurysm or dissection. Calcified atherosclerosis involving the other proximal great vessels which appear patent  10/2016  D&C, hysteroscopy.  Postmenopausal bleeding.   Previous Echocardiogram 08/13/2014 showed normal ejection fraction, normal RV size and function, moderate LVH   Renal artery duplex showing no blockage of the mid to distal vessels bilaterally, challenging study    PMH:   has a past medical history of Anxiety (09/13/2015), Arthritis, Atypical chest pain (01/15/2021), [place] (10/24/2021), Bradycardia (07/17/2016), Cervical spondylosis with radiculopathy (01/26/2017), Chickenpox, Chronic kidney disease, stage 2 (mild) (04/08/2014), Constipation (11/17/2019), Cough (01/25/2016), Diabetes mellitus without complication (HCC), Disorder of rotator cuff (06/24/2018), Diverticulitis, Dysuria (03/22/2017), Edema of foot (04/08/2014), Elevated troponin (12/15/2020), Essential hypertension (04/08/2014), Fall (03/26/2018), Gastroenteritis (08/12/2021), Gastrointestinal hemorrhage (08/03/2015), GI bleed, High cholesterol, History of blood transfusion, Hyperglycemia due to type 2 diabetes mellitus  (HCC) (03/11/2015), Hypertension, Hypertensive urgency (12/15/2020), Low back pain (04/08/2014), Morbid obesity (HCC) (04/08/2014), Palpitations (12/15/2020), Peripheral edema (12/22/2019),  Postmenopausal bleeding (07/17/2016), Primary hypertension (03/25/2020), Pure hypercholesterolemia (08/10/2020), Radiculopathy due to cervical spondylosis (01/26/2017), Recurrent falls (08/13/2019), Renal insufficiency, Skin cyst (10/24/2021), Stage 3a chronic kidney disease (HCC) (01/15/2021), Swelling of lower leg (07/19/2016), and Vertigo (10/13/2015).  PSH:    Past Surgical History:  Procedure Laterality Date   APPENDECTOMY     CHOLECYSTECTOMY     ECTOPIC PREGNANCY SURGERY     EYE SURGERY     bilateral cataracts   EYE SURGERY     02/11/2019 repair hole in right eye    gallbladder      HYSTEROSCOPY WITH D & C N/A 10/26/2016   Procedure: DILATATION AND CURETTAGE /HYSTEROSCOPY;  Surgeon: Prescilla Brod, MD;  Location: [facility] ORS;  Service: Gynecology;  Laterality: N/A;   HYSTEROSCOPY WITH D & C N/A 07/07/2018   Procedure: DILATATION AND CURETTAGE /HYSTEROSCOPY;  Surgeon: Prescilla Brod, MD;  Location: [facility] ORS;  Service: Gynecology;  Laterality: N/A;   RIGHT HEART CATH Bilateral 10/10/2023   Procedure: RIGHT HEART CATH;  Surgeon: Mardell Shade, MD;  Location: [facility] INVASIVE CV LAB;  Service: Cardiovascular;  Laterality: Bilateral;   THYROIDECTOMY, PARTIAL      No current facility-administered medications for this visit.   Current Outpatient Medications  Medication Sig Dispense Refill   acetaminophen  (TYLENOL ) 500 MG tablet Take 500 mg by mouth every 6 (six) hours as needed for mild pain or headache.     Ascorbic Acid (VITAMIN C PO) Take 1 tablet by mouth daily.     aspirin  EC 81 MG tablet Take 81 mg by mouth 2 (two) times a week. Swallow whole.     benzonatate  (TESSALON ) 200 MG capsule Take 200 mg by mouth 3 (three) times daily as needed for cough.     blood glucose meter kit and supplies KIT  Accu chek, Dx code E11.65, check 3 times daily 1 each 0   Blood Pressure KIT 1 Device by Does not apply route daily. 1 kit 0   carvedilol  (COREG ) 6.25 MG tablet Take 1 tablet (6.25 mg total) by mouth 2 (two) times daily with a meal.     gabapentin  (NEURONTIN ) 300 MG capsule Take 300 mg by mouth at bedtime.     glucose blood (ACCU-CHEK GUIDE) test strip USE AS INSTRUCTED 3 TIMES DAILY 300 strip 12   guaiFENesin (SCOT-TUSSIN EXPECTORANT) 100 MG/5ML liquid Take 20 mLs by mouth 3 (three) times daily as needed for cough.     hydrALAZINE  (APRESOLINE ) 100 MG tablet Take 1 tablet (100 mg total) by mouth 3 (three) times daily. TAKE 1 TABLET (100 MG TOTAL) BY MOUTH 4 (FOUR) TIMES DAILY. Must keep upcoming appointment on 10/21/23 for future refills. (Patient taking differently: Take 100 mg by mouth in the morning, at noon, in the evening, and at bedtime. TAKE 1 TABLET (100 MG TOTAL) BY MOUTH 4 (FOUR) TIMES DAILY. Must keep upcoming appointment on 10/21/23 for future refills.)     insulin  NPH Human (NOVOLIN N) 100 UNIT/ML injection Inject 15-20 Units into the skin 2 (two) times daily before a meal. Inject 20 units in the morning an 15 units in the evening.     insulin  regular (NOVOLIN R) 100 units/mL injection Inject 0-0.08 mLs (0-8 Units total) into the skin 3 (three) times daily before meals. 30 mL 11   Insulin  Syringe-Needle U-100 (BD INSULIN  SYRINGE U/F) 30G X 1/2" 0.5 ML MISC USE UP TO 5X PER DAY WITH INSULIN  2X (NPH) AND 3X (REGULAR) 500 each 12   ipratropium-albuterol  (DUONEB) 0.5-2.5 (  3) MG/3ML SOLN Take 3 mLs by nebulization 4 (four) times daily. Take scheduled 4 times daily until wheezing and shortness of breath from RSV improves. Then take as needed.     LORazepam  (ATIVAN ) 1 MG tablet Take 0.5 tablets (0.5 mg total) by mouth daily as needed for anxiety. Inform pt 0.5 dose on backorder has to cut this in 1/2 dose 15 tablet 0   Multiple Vitamins-Minerals (CENTRUM SILVER 50+WOMEN) TABS Take 1 tablet by mouth  daily with breakfast.     polyethylene glycol powder (GLYCOLAX /MIRALAX ) 17 GM/SCOOP powder Take 17 g by mouth daily as needed for moderate constipation or severe constipation. Can take up to 2x per day prn. Mix with 8 ounces of liquid 3350 g 11   rosuvastatin  (CRESTOR ) 20 MG tablet TAKE 1 TABLET BY MOUTH EVERY DAY 90 tablet 3   senna-docusate (SENOKOT-S) 8.6-50 MG tablet Take 1 tablet by mouth 2 (two) times daily.     spironolactone  (ALDACTONE ) 25 MG tablet Take 25 mg by mouth 3 (three) times a week. Monday Wednesday and friday     torsemide  (DEMADEX ) 10 MG tablet Take 2 tablets (20 mg total) by mouth daily.     traZODone  (DESYREL ) 50 MG tablet Take 1 tablet (50 mg total) by mouth at bedtime as needed for sleep.     Facility-Administered Medications Ordered in Other Visits  Medication Dose Route Frequency Provider Last Rate Last Admin   [START ON 10/20/2023]  stroke: early stages of recovery book   Does not apply Once Laymond Priestly, NP       aspirin  chewable tablet 81 mg  81 mg Oral Daily Jonette Nestle A, NP   81 mg at 10/19/23 1748   clopidogrel (PLAVIX) tablet 75 mg  75 mg Oral Daily Jonette Nestle A, NP   75 mg at 10/19/23 1748    Allergies:   Celexa  [citalopram ], Jardiance  [empagliflozin ], Norvasc  [amlodipine ], Tape, Penicillin v, Penicillin v potassium, and Penicillins   Social History:  The patient  reports that she has never smoked. She has never used smokeless tobacco. She reports that she does not drink alcohol and does not use drugs.   Family History:   family history includes Diabetes in her mother, sister, and another family member; Healthy in her father; Heart disease in her sister; Hypertension in her mother; Stroke in her mother.    Review of Systems: Review of Systems  Constitutional: Negative.   Eyes: Negative.   Respiratory: Negative.    Cardiovascular:  Positive for leg swelling.  Gastrointestinal: Negative.   Genitourinary: Negative.   Musculoskeletal: Negative.    Neurological: Negative.   Psychiatric/Behavioral: Negative.    All other systems reviewed and are negative.   PHYSICAL EXAM: VS:  There were no vitals taken for this visit. , BMI There is no height or weight on file to calculate BMI. Constitutional:  oriented to person, place, and time. No distress.  HENT:  Head: Grossly normal Eyes:  no discharge. No scleral icterus.  Neck: No JVD, no carotid bruits  Cardiovascular: Regular rate and rhythm, no murmurs appreciated 1-2+ woody edema left greater than right, minimal pitting Pulmonary/Chest: Clear to auscultation bilaterally, no wheezes or rails Abdominal: Soft.  no distension.  no tenderness.  Musculoskeletal: Normal range of motion Neurological:  normal muscle tone. Coordination normal. No atrophy Skin: Skin warm and dry Psychiatric: normal affect, pleasant  Recent Labs: 11/27/2022: TSH 2.204 10/09/2023: B Natriuretic Peptide 223.7; Magnesium  1.7 10/19/2023: ALT 20; BUN 42; Creatinine, Ser 2.57;  Hemoglobin 11.8; Platelets 591; Potassium 4.0; Sodium 131    Lipid Panel Lab Results  Component Value Date   CHOL 129 12/13/2021   HDL 45.40 12/13/2021   LDLCALC 40 12/15/2020   TRIG 215.0 (H) 12/13/2021      Wt Readings from Last 3 Encounters:  10/15/23 225 lb 12 oz (102.4 kg)  09/09/23 257 lb 15 oz (117 kg)  08/03/23 257 lb 15 oz (117 kg)     ASSESSMENT AND PLAN:  Essential hypertension, benign Blood pressures continue to run high 1 40-1 60 systolic She is requesting no medication change at this time, would prefer us  to call her pharmacy to find out about bystolic .  She has been out of this for 2 days, told by the pharmacy they will no longer carry it Other medications that could be used include carvedilol  but she does not want to change and does not want to try different pharmacy as family picks up her medications for her (granddaughter) Addendum: We have contacted [facility], they report they do have bystolic  and they had recently  filled a 90-day prescription, she was trying to fill her prescription too early and if she is lost her medication, the pharmacy can petition to insurance for an override to fill new prescription early.   Venous insufficiency/chronic leg swelling Recommended compression hose, leg elevation, Ace wraps if needed Avoid calcium  channel blockers Leg elevation recommended Continue torsemide  10 mg daily  Mixed hyperlipidemia On crestor  10 Cholesterol at goal  Uncontrolled type 2 diabetes mellitus with stage 3 chronic kidney disease, with long-term current use of insulin  (HCC) Uncontrolled, A1C around 9, working with endocrine Long history of poorly controlled,  Low carbohydrate diet recommended  CKD (chronic kidney disease) stage 3, GFR 30-59 ml/min Followed by nephrology, Dr. Zelda Hickman On torsemide  10 daily, tolerating valsartan   Morbid obesity (HCC) Unable to exercise.  Leg weakness Obesity hypoventilation  Walks with a walker, presents with a walker today  Chronic diastolic CHF (congestive heart failure) (HCC) Appears euvolemic, leg swelling out of proportion to CHF symptoms On low-dose torsemide  10 daily  PAD Followed by vascular Subclavian stenosis on the right, having minimal symptoms Drop in pressure noted previously on right    Total encounter time more than 30 minutes  Greater than 50% was spent in counseling and coordination of care with the patient   No orders of the defined types were placed in this encounter.     Signed, Juanda Noon, M.D., Ph.D. 10/19/2023  [facility] [place], [place] [phone]

## 2023-10-19 NOTE — ED Triage Notes (Addendum)
 Patient from [facility]. EMS called out for numbness and weakness in LLE and abnormal gait. Normally able to ambulate, as she is there for rehab, but she could not walk this morning. LKW 1900 yesterday. Now patient has bilateral leg numbness and R hand numbness per EMS. Also complains of jerking sensation that she has had since leaving the hospital.

## 2023-10-19 NOTE — Consult Note (Signed)
 NEUROLOGY CONSULT NOTE   Date of service: Oct 19, 2023 Patient Name: Alexis Farmer MRN:  [id number] DOB:  Apr 08, 1941 Chief Complaint: "numbness and weakness" Requesting Provider: Karlyn Overman, MD  History of Present Illness  Alexis Farmer is a 83 y.o. female with hx of B-cell lymphoma, hypertension, hyperlipidemia, CHF, diabetes, anxiety, obesity, CKD stage III, diverticulitis, GI bleed, vertigo, recent admission and discharged on 10/15/2023 for CHF exacerbation who presents from SNF for numbness and weakness in left lower extremity and abnormal gait.  She states she was in her normal state of health last night prior to going to bed @ around 10pm. This morning she woke at 0700 and was having some juice which she spilt while using her left hand. She states that she was then unable to get up out of bed due to her legs being weak. At baseline she has been using a walker while at rehab. She also endorses having some numbness in her left leg as well as her right hand. She has had recent swelling of her BLE due to CHF and was recently diuresed at her last admission. She states she does not take 81 ASA every day, just on some days. She denies smoking, alcohol use or illicit drugs. She does state that a few months back she had trouble with her speech on two occasions that lasted for a few minutes and then resolved.  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  Neurology consulted for stroke workup  LKW: 2200 Modified rankin score: 4-Needs assistance to walk and tend to bodily needs IV Thrombolysis:  No outside window EVT:  No LVO   NIHSS components Score: Comment  1a Level of Conscious 0[x]  1[]  2[]  3[]       1b LOC Questions 0[x]  1[]  2[]       1c LOC Commands 0[x]  1[]  2[]       2 Best Gaze 0[x]  1[]  2[]       3 Visual 0[x]  1[]  2[]  3[]      4 Facial Palsy 0[x]  1[]  2[]  3[]      5a Motor Arm - left 0[x]  1[]  2[]  3[]  4[]  UN[]    5b Motor Arm - Right 0[x]  1[]  2[]  3[]  4[]  UN[]    6a Motor Leg - Left 0[]  1[]  2[x]  3[]  4[]  UN[]    6b Motor Leg - Right 0[x]  1[]  2[]  3[]  4[]  UN[]    7 Limb Ataxia 0[]  1[x]  2[]  UN[]      8 Sensory 0[x]  1[]  2[]  UN[]      9 Best Language 0[x]  1[]  2[]  3[]      10 Dysarthria 0[x]  1[]  2[]  UN[]      11 Extinct. and Inattention 0[x]  1[]  2[]       TOTAL:  3      ROS  Comprehensive ROS performed and pertinent positives documented in HPI    Past History   Past Medical History:  Diagnosis Date   Anxiety 09/13/2015   Arthritis    Atypical chest pain 01/15/2021   Blackhead 10/24/2021   Bradycardia 07/17/2016   Formatting of this note might be different from the original.  Last Assessment & Plan:   Found to be bradycardic at outside hospital. They stopped metoprolol  and verapamil  and heart rate has been normal since then. Echo appears  to have been reassuring at the outside facility. Appears to be doing much better with regards to this. She'll continue to monitor for recurrent symptoms. 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  Last Assessment & Plan:   Continues to have issues with this.  She is following with a specialist and it sounds as though they are planning on an MRI.  Benign exam today.   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 Rudean Corrente of this note might be different from the original.  Dr. Zelda Hickman      Last Assessment & Plan:   Recheck kidney function today.   Constipation 11/17/2019   Cough 01/25/2016   Formatting of this note might be different from the original.  Last Assessment & Plan:   Minimal nighttime cough. Improves when she washes her  pillows consistently. Suspect allergies contributing. She'll monitor.   Diabetes mellitus without complication (HCC)    one elevated reading/ no treatment   Disorder of rotator cuff 06/24/2018   Diverticulitis    Dysuria 03/22/2017   Formatting of this note might be different from the original.  Last Assessment & Plan:   Symptoms concerning for UTI.  Will check urinalysis.   Edema of foot 04/08/2014   Formatting of this note might be different from the original.  Last Assessment & Plan:   Chronic pedal edema. Suspect venous insufficiency. No orthopnea or shortness of breath. Advised elevation of her legs. Consider compression stockings in the future.   Elevated troponin 12/15/2020   Essential hypertension 04/08/2014   Formatting of this note might be different from the original.  Last Assessment & Plan:   Well-controlled on recheck.  Continue current regimen.   Fall 03/26/2018   Gastroenteritis 08/12/2021   Gastrointestinal hemorrhage 08/03/2015   GI bleed    High cholesterol    History of blood transfusion    Hyperglycemia due to type 2 diabetes mellitus (HCC) 03/11/2015   Hypertension    Hypertensive urgency 12/15/2020   Low back pain 04/08/2014   Morbid obesity (HCC) 04/08/2014   Formatting of this note might be different from the original.  Last Assessment & Plan:   Weight is stable. Discussed diet and exercise at length. Encouraged whatever exercise she can do. Given diet information.   Palpitations 12/15/2020   Peripheral edema 12/22/2019   Postmenopausal bleeding 07/17/2016   Formatting of this note might be different from the original.  Last Assessment & Plan:   Recent D&C. Following with gynecology. Benign findings. Monitor for recurrence.   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  Last Assessment & Plan:   Continues to have  issues with this.  She is following with a specialist and it sounds as though they are planning on an MRI.  Benign exam today.   Recurrent falls 08/13/2019   Renal insufficiency    Skin cyst 10/24/2021   Stage 3a chronic kidney disease (HCC) 01/15/2021   Swelling of lower leg 07/19/2016   Vertigo 10/13/2015   Formatting of this note might be different from the original.  Last Assessment & Plan:   No recurrence. Discussed that meclizine  is an as needed medication and that she does not need to take this daily. She will  continue to monitor.    Past Surgical History:  Procedure Laterality Date   APPENDECTOMY     CHOLECYSTECTOMY     ECTOPIC PREGNANCY SURGERY     EYE SURGERY     bilateral cataracts   EYE SURGERY     02/11/2019 repair hole in right eye    gallbladder      HYSTEROSCOPY WITH D & C N/A 10/26/2016   Procedure: DILATATION AND CURETTAGE /HYSTEROSCOPY;  Surgeon: Prescilla Brod, MD;  Location: [facility];  Service: Gynecology;  Laterality: N/A;   HYSTEROSCOPY WITH D & C N/A 07/07/2018   Procedure: DILATATION AND CURETTAGE /HYSTEROSCOPY;  Surgeon: Prescilla Brod, MD;  Location: [facility];  Service: Gynecology;  Laterality: N/A;   RIGHT HEART CATH Bilateral 10/10/2023   Procedure: RIGHT HEART CATH;  Surgeon: Mardell Shade, MD;  Location: [facility];  Service: Cardiovascular;  Laterality: Bilateral;   THYROIDECTOMY, PARTIAL      Family History: Family History  Problem Relation Age of Onset   Diabetes Mother    Hypertension Mother    Stroke Mother    Diabetes Other    Healthy Father    Diabetes Sister    Heart disease Sister     Social History  reports that she has never smoked. She has never used smokeless tobacco. She reports that she does not drink alcohol and does not use drugs.  Allergies  Allergen Reactions   Celexa  [Citalopram ]     Diarrhea upset stomach    Jardiance  [Empagliflozin ] Other (See Comments)    Reaction not recalled   Norvasc   [Amlodipine ]     Leg edema   Tape Other (See Comments)    Leaves the skin "raw" if left on for a period of time- tolerates paper tape   Penicillin V Rash   Penicillin V Potassium Rash   Penicillins Rash    Has patient had a PCN reaction causing immediate rash, facial/tongue/throat swelling, SOB or lightheadedness with hypotension: Yes Has patient had a PCN reaction causing severe rash involving mucus membranes or skin necrosis: No Has patient had a PCN reaction that required hospitalization No Has patient had a PCN reaction occurring within the last 10 years: Yes If all of the above answers are "NO", then may proceed with Cephalosporin use.     Medications  No current facility-administered medications for this encounter.  Current Outpatient Medications:    acetaminophen  (TYLENOL ) 500 MG tablet, Take 500 mg by mouth every 6 (six) hours as needed for mild pain or headache., Disp: , Rfl:    Ascorbic Acid (VITAMIN C PO), Take 1 tablet by mouth daily., Disp: , Rfl:    aspirin  EC 81 MG tablet, Take 81 mg by mouth 2 (two) times a week. Swallow whole., Disp: , Rfl:    benzonatate  (TESSALON ) 200 MG capsule, Take 200 mg by mouth 3 (three) times daily as needed for cough., Disp: , Rfl:    carvedilol  (COREG ) 6.25 MG tablet, Take 1 tablet (6.25 mg total) by mouth 2 (two) times daily with a meal., Disp: , Rfl:    gabapentin  (NEURONTIN ) 300 MG capsule, Take 300 mg by mouth at bedtime., Disp: , Rfl:    guaiFENesin (SCOT-TUSSIN EXPECTORANT) 100 MG/5ML liquid, Take 20 mLs by mouth 3 (three) times daily as needed for cough., Disp: , Rfl:    hydrALAZINE  (APRESOLINE ) 100 MG tablet, Take 1 tablet (100 mg total) by mouth 3 (three) times daily. TAKE 1 TABLET (100 MG TOTAL) BY MOUTH 4 (FOUR)  TIMES DAILY. Must keep upcoming appointment on 10/21/23 for future refills. (Patient taking differently: Take 100 mg by mouth in the morning, at noon, in the evening, and at bedtime. TAKE 1 TABLET (100 MG TOTAL) BY MOUTH 4 (FOUR)  TIMES DAILY. Must keep upcoming appointment on 10/21/23 for future refills.), Disp: , Rfl:    insulin  NPH Human (NOVOLIN N) 100 UNIT/ML injection, Inject 15-20 Units into the skin 2 (two) times daily before a meal. Inject 20 units in the morning an 15 units in the evening., Disp: , Rfl:    insulin  regular (NOVOLIN R) 100 units/mL injection, Inject 0-0.08 mLs (0-8 Units total) into the skin 3 (three) times daily before meals., Disp: 30 mL, Rfl: 11   ipratropium-albuterol  (DUONEB) 0.5-2.5 (3) MG/3ML SOLN, Take 3 mLs by nebulization 4 (four) times daily. Take scheduled 4 times daily until wheezing and shortness of breath from RSV improves. Then take as needed., Disp: , Rfl:    LORazepam  (ATIVAN ) 1 MG tablet, Take 0.5 tablets (0.5 mg total) by mouth daily as needed for anxiety. Inform pt 0.5 dose on backorder has to cut this in 1/2 dose, Disp: 15 tablet, Rfl: 0   Multiple Vitamins-Minerals (CENTRUM SILVER 50+WOMEN) TABS, Take 1 tablet by mouth daily with breakfast., Disp: , Rfl:    polyethylene glycol powder (GLYCOLAX /MIRALAX ) 17 GM/SCOOP powder, Take 17 g by mouth daily as needed for moderate constipation or severe constipation. Can take up to 2x per day prn. Mix with 8 ounces of liquid, Disp: 3350 g, Rfl: 11   rosuvastatin  (CRESTOR ) 20 MG tablet, TAKE 1 TABLET BY MOUTH EVERY DAY, Disp: 90 tablet, Rfl: 3   senna-docusate (SENOKOT-S) 8.6-50 MG tablet, Take 1 tablet by mouth 2 (two) times daily., Disp: , Rfl:    spironolactone  (ALDACTONE ) 25 MG tablet, Take 25 mg by mouth 3 (three) times a week. Monday Wednesday and friday, Disp: , Rfl:    torsemide  (DEMADEX ) 10 MG tablet, Take 2 tablets (20 mg total) by mouth daily., Disp: , Rfl:    traZODone  (DESYREL ) 50 MG tablet, Take 1 tablet (50 mg total) by mouth at bedtime as needed for sleep., Disp: , Rfl:    blood glucose meter kit and supplies KIT, Accu chek, Dx code E11.65, check 3 times daily, Disp: 1 each, Rfl: 0   Blood Pressure KIT, 1 Device by Does not apply  route daily., Disp: 1 kit, Rfl: 0   glucose blood (ACCU-CHEK GUIDE) test strip, USE AS INSTRUCTED 3 TIMES DAILY, Disp: 300 strip, Rfl: 12   Insulin  Syringe-Needle U-100 (BD INSULIN  SYRINGE U/F) 30G X 1/2" 0.5 ML MISC, USE UP TO 5X PER DAY WITH INSULIN  2X (NPH) AND 3X (REGULAR), Disp: 500 each, Rfl: 12  Vitals   Vitals:   10/19/23 1208 10/19/23 1500 10/19/23 1622 10/19/23 1630  BP: (!) 184/62 (!) 180/71  117/67  Pulse: 71 74 71 66  Resp: 14 17 17 15   Temp: 98.4 F (36.9 C) 98.4 F (36.9 C)    TempSrc: Oral Oral    SpO2: 100% 100% 100% 100%    There is no height or weight on file to calculate BMI.  Physical Exam   Constitutional: Appears well-developed and well-nourished.   Psych: Affect appropriate to situation.   Eyes: No scleral injection.   HENT: No OP obstruction.   Head: Normocephalic.   Cardiovascular: Normal rate and regular rhythm.   Respiratory: Effort normal, non-labored breathing.   GI: Soft.  No distension. There is no tenderness.  Skin: WDI.  Bilateral lowers with 1-2+ nonpitting edema. Pressure ulcers to bilateral heels.   Neurologic Examination   Mental Status -  Level of arousal and orientation to time, place, and person were intact. No aphasia or dysarthria Language including expression, naming, repetition, comprehension was assessed and found intact. Attention span and concentration were normal. Recent and remote memory were intact. Fund of Knowledge was assessed and was intact.  Cranial Nerves II - XII - II - Visual field intact OU. III, IV, VI - Extraocular movements intact. V - Facial sensation intact bilaterally. VII - Facial movement intact bilaterally. VIII - Hearing & vestibular intact bilaterally. X - Palate elevates symmetrically. XI - Chin turning & shoulder shrug intact bilaterally. XII - Tongue protrusion intact.  Motor Strength - The patient's strength was normal RUE at 5/5 with subtle LUE weakness rated at 4+/5 as well as weaker grip  strength on left. RLE 4/5, left lower 3/5   Bulk was normal and fasciculations were absent.   Motor Tone - Muscle tone was assessed at the neck and appendages and was normal . Sensory - Light touch, temperature/pinprick were assessed and were symmetrical, but there is severe allodynia with tactile stimulation of BLE below the knees.    Coordination - Mild ataxia in left arm with FTN Tremor was absent. Gait and Station - Deferred.  Labs/Imaging/Neurodiagnostic studies   CBC:  Recent Labs  Lab 2023-11-11 1109  WBC 8.6  NEUTROABS 3.4  HGB 11.8*  HCT 36.8  MCV 89.1  PLT 591*   Basic Metabolic Panel:  Lab Results  Component Value Date   NA 131 (L) 2023-11-11   K 4.0 11/11/23   CO2 28 11-Nov-2023   GLUCOSE 202 (H) Nov 11, 2023   BUN 42 (H) 11-11-2023   CREATININE 2.57 (H) 2023/11/11   CALCIUM  9.1 11/11/23   GFRNONAA 18 (L) 11/11/23   GFRAA 56 (L) 10/24/2019   Lipid Panel:  Lab Results  Component Value Date   LDLCALC 40 12/15/2020   HgbA1c:  Lab Results  Component Value Date   HGBA1C 9.7 08/01/2023   Urine Drug Screen: No results found for: "LABOPIA", "COCAINSCRNUR", "LABBENZ", "AMPHETMU", "THCU", "LABBARB"  Alcohol Level No results found for: "ETH" INR  Lab Results  Component Value Date   INR 0.98 10/08/2016    MRI Brain (Personally reviewed): Acute nonhemorrhagic cortical infarct in the medial right frontal lobe, 10 mm cortical infarct in the medial left parietal lobe, punctate cortical infarcts in the right precentral gyrus, right frontal operculum, and right middle frontal gyrus. Late subacute infarct in the lateral right occipital lobe.    ASSESSMENT  Alexis Farmer is an 83 y.o. female with B-cell lymphoma, hypertension, hyperlipidemia, CHF, diabetes, anxiety, obesity, CKD stage III, diverticulitis, GI bleed, vertigo, recent admission to [facility] discharged on 10/15/2023 for CHF exacerbation who presents from her SNF for acute onset of numbness and weakness in  left lower extremity with abnormal gait.  - Exam reveals LUE and LLE weakness referable to the right precentral gyrus acute stroke seen on MRI. No facial droop or visual field cut noted. Also present is allodynia and hyperpathia below the knees that she states is chronic.  - MRI brain as documented above, with multiple acute and subacute ischemic infarctions in separate vascular distributions, concerning for cardioembolic etiology. Primary risk factor for cardioembolic strokes is her CHF.   RECOMMENDATIONS   - HgbA1c, fasting lipid panel - Frequent neuro checks - Echocardiogram - MRA head and neck (patient unable to have  CTA due to high Cr) - Carotid US  bilaterally - Prophylactic therapy-Antiplatelet med: Aspirin  - dose 81mg  and plavix 75mg  daily. Will defer to Stroke Team regarding possible initiation of anticoagulation   - BP management per standard protocol for CHF. Based on her history she is now out of the permissive HTN time window.  - Statin   - Bedside swallow screen  - Risk factor modification - Telemetry monitoring - PT consult, OT consult, Speech consult - Stroke team to follow ______________________________________________________________________    Signed, Laymond Priestly, NP [facility]  I have seen and examined the patient. I have formulated the assessment and recommendations. 83 year old female with CHF presenting with multiple strokes in separate vascular territories, concerning for cardioembolic mechanism. Exam reveals LLE > LUE weakness. Recommendations as above.  Electronically signed: Dr. Tarance Balan

## 2023-10-20 DIAGNOSIS — I63131 Cerebral infarction due to embolism of right carotid artery: Secondary | ICD-10-CM | POA: Diagnosis not present

## 2023-10-20 DIAGNOSIS — N1832 Chronic kidney disease, stage 3b: Secondary | ICD-10-CM

## 2023-10-20 DIAGNOSIS — I69391 Dysphagia following cerebral infarction: Secondary | ICD-10-CM

## 2023-10-20 DIAGNOSIS — Z794 Long term (current) use of insulin: Secondary | ICD-10-CM

## 2023-10-20 DIAGNOSIS — E1122 Type 2 diabetes mellitus with diabetic chronic kidney disease: Secondary | ICD-10-CM

## 2023-10-20 DIAGNOSIS — E785 Hyperlipidemia, unspecified: Secondary | ICD-10-CM | POA: Diagnosis not present

## 2023-10-20 DIAGNOSIS — R531 Weakness: Secondary | ICD-10-CM | POA: Diagnosis not present

## 2023-10-20 DIAGNOSIS — I5030 Unspecified diastolic (congestive) heart failure: Secondary | ICD-10-CM

## 2023-10-20 DIAGNOSIS — R29703 NIHSS score 3: Secondary | ICD-10-CM | POA: Diagnosis not present

## 2023-10-20 LAB — CBG MONITORING, ED
Glucose-Capillary: 154 mg/dL — ABNORMAL HIGH (ref 70–99)
Glucose-Capillary: 206 mg/dL — ABNORMAL HIGH (ref 70–99)
Glucose-Capillary: 243 mg/dL — ABNORMAL HIGH (ref 70–99)

## 2023-10-20 LAB — RENAL FUNCTION PANEL
Albumin: 2.7 g/dL — ABNORMAL LOW (ref 3.5–5.0)
Anion gap: 9 (ref 5–15)
BUN: 35 mg/dL — ABNORMAL HIGH (ref 8–23)
CO2: 27 mmol/L (ref 22–32)
Calcium: 9.4 mg/dL (ref 8.9–10.3)
Chloride: 97 mmol/L — ABNORMAL LOW (ref 98–111)
Creatinine, Ser: 1.98 mg/dL — ABNORMAL HIGH (ref 0.44–1.00)
GFR, Estimated: 24 mL/min — ABNORMAL LOW (ref >60)
Glucose, Bld: 191 mg/dL — ABNORMAL HIGH (ref 70–99)
Phosphorus: 3.5 mg/dL (ref 2.5–4.6)
Potassium: 4.2 mmol/L (ref 3.5–5.1)
Sodium: 133 mmol/L — ABNORMAL LOW (ref 135–145)

## 2023-10-20 LAB — LIPID PANEL
Cholesterol: 140 mg/dL (ref 0–200)
HDL: 47 mg/dL (ref >40)
LDL Cholesterol: 62 mg/dL (ref 0–99)
Total CHOL/HDL Ratio: 3 ratio
Triglycerides: 155 mg/dL — ABNORMAL HIGH (ref <150)
VLDL: 31 mg/dL (ref 0–40)

## 2023-10-20 LAB — GLUCOSE, CAPILLARY
Glucose-Capillary: 222 mg/dL — ABNORMAL HIGH (ref 70–99)
Glucose-Capillary: 194 mg/dL — ABNORMAL HIGH (ref 70–99)

## 2023-10-20 MED ORDER — GABAPENTIN 300 MG PO CAPS: 300.0000 mg | ORAL_CAPSULE | Freq: Every day | ORAL | Status: DC

## 2023-10-20 MED ORDER — ASPIRIN 81 MG PO TBEC
81.0000 mg | DELAYED_RELEASE_TABLET | Freq: Every day | ORAL | Status: DC
  Administered 2023-10-21 – 2023-10-24 (×4): 81 mg via ORAL
  Filled 2023-10-20 (×4): qty 1

## 2023-10-20 MED ORDER — HYDRALAZINE HCL 50 MG PO TABS
100.0000 mg | ORAL_TABLET | Freq: Three times a day (TID) | ORAL | Status: DC
  Administered 2023-10-20 – 2023-10-24 (×12): 100 mg via ORAL
  Filled 2023-10-20 (×12): qty 2

## 2023-10-20 MED ORDER — HYDRALAZINE HCL 20 MG/ML IJ SOLN: 10.0000 mg | Freq: Four times a day (QID) | INTRAMUSCULAR | Status: DC | PRN

## 2023-10-20 MED ORDER — SPIRONOLACTONE 25 MG PO TABS: 25.0000 mg | ORAL_TABLET | ORAL | Status: DC

## 2023-10-20 MED ORDER — TORSEMIDE 20 MG PO TABS
20.0000 mg | ORAL_TABLET | Freq: Every day | ORAL | Status: DC
  Administered 2023-10-21: 20 mg via ORAL
  Filled 2023-10-20 (×3): qty 1

## 2023-10-20 MED ORDER — INSULIN ASPART 100 UNIT/ML IJ SOLN
0.0000 [IU] | Freq: Three times a day (TID) | INTRAMUSCULAR | Status: DC
  Administered 2023-10-20 (×2): 5 [IU] via SUBCUTANEOUS
  Administered 2023-10-21: 11 [IU] via SUBCUTANEOUS
  Administered 2023-10-21: 15 [IU] via SUBCUTANEOUS
  Administered 2023-10-21: 8 [IU] via SUBCUTANEOUS
  Administered 2023-10-22: 3 [IU] via SUBCUTANEOUS
  Administered 2023-10-22 – 2023-10-23 (×3): 5 [IU] via SUBCUTANEOUS

## 2023-10-20 MED ORDER — INSULIN GLARGINE-YFGN 100 UNIT/ML ~~LOC~~ SOLN
15.0000 [IU] | Freq: Every day | SUBCUTANEOUS | Status: DC
  Administered 2023-10-20: 10 [IU] via SUBCUTANEOUS
  Administered 2023-10-21 – 2023-10-22 (×2): 15 [IU] via SUBCUTANEOUS
  Filled 2023-10-20 (×4): qty 0.15

## 2023-10-20 MED ORDER — TRAZODONE HCL 50 MG PO TABS
50.0000 mg | ORAL_TABLET | Freq: Every evening | ORAL | Status: DC | PRN
  Administered 2023-10-20 – 2023-10-22 (×3): 50 mg via ORAL
  Filled 2023-10-20 (×3): qty 1

## 2023-10-20 MED ORDER — ACETAMINOPHEN 325 MG PO TABS
650.0000 mg | ORAL_TABLET | ORAL | Status: DC | PRN
  Administered 2023-10-21 – 2023-10-22 (×4): 650 mg via ORAL
  Filled 2023-10-20 (×4): qty 2

## 2023-10-20 MED ORDER — LORAZEPAM 0.5 MG PO TABS: 0.5000 mg | ORAL_TABLET | Freq: Four times a day (QID) | ORAL | Status: DC | PRN

## 2023-10-20 MED ORDER — INSULIN ASPART 100 UNIT/ML IJ SOLN
0.0000 [IU] | Freq: Every day | INTRAMUSCULAR | Status: DC
  Administered 2023-10-22: 2 [IU] via SUBCUTANEOUS

## 2023-10-20 MED ORDER — LORAZEPAM 0.5 MG PO TABS
0.5000 mg | ORAL_TABLET | Freq: Two times a day (BID) | ORAL | Status: DC | PRN
  Administered 2023-10-20 – 2023-10-21 (×2): 0.5 mg via ORAL
  Filled 2023-10-20 (×2): qty 1

## 2023-10-20 NOTE — Evaluation (Signed)
 Physical Therapy Evaluation Patient Details Name: Alexis Farmer MRN: [id number] DOB: Dec 06, 1940 Today's Date: 10/20/2023  History of Present Illness  83 y.o. female presents to [facility] 10/19/23 from SNF with with numbness and weakness in LLE and abnormal gait. Progressed to B LE numbness and R hand numbness. 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 Recent d/c for CHF exacerbation. PMHx: monoclonal B-cell lymphoma, stage IIIb CKD, DM, HTN, HFpEF, chronic pain, obesity, vertigo   Clinical Impression  Pt supine on stretcher upon arrival and agreeable to PT eval. PTA, pt was getting rehab and needed assistance to stand and ambulate short distances with a RW. In today's session, pt required ModA/MaxA for bed mobility. Initially, pt was able to perform step-pivot to [facility] with MaxAx2 and RW. Pt had increased difficulty transferring back to bed and required MaxAx2 to perform a stand pivot while holding onto posterior elbow of PT/RN. Pt currently with functional limitations due to the deficits listed below (see PT Problem List). Pt would benefit from acute skilled PT to address functional impairments. Pt has intermittent assist available from granddaughter. Recommending post-acute rehab <3hrs to work towards independence with mobility. Acute PT to follow.         If plan is discharge home, recommend the following: A lot of help with walking and/or transfers;A lot of help with bathing/dressing/bathroom;Assistance with cooking/housework;Direct supervision/assist for medications management;Assist for transportation   Can travel by private vehicle   No    Equipment Recommendations None recommended by PT     Functional Status Assessment Patient has had a recent decline in their functional status and demonstrates the ability to make significant improvements in  function in a reasonable and predictable amount of time.     Precautions / Restrictions Precautions Precautions: Fall Restrictions Weight Bearing Restrictions Per Provider Order: No      Mobility  Bed Mobility Overal bed mobility: Needs Assistance Bed Mobility: Supine to Sit, Sit to Supine    Supine to sit: Mod assist Sit to supine: Max assist   General bed mobility comments: ModA to complete moving LE's off EOB and to raise trunk. Able to shift hips forward with increased time/effort    Transfers Overall transfer level: Needs assistance Equipment used: Rolling walker (2 wheels), 2 person hand held assist Transfers: Sit to/from Stand, Bed to chair/wheelchair/BSC Sit to Stand: Max assist, +2 physical assistance, +2 safety/equipment Stand pivot transfers: Max assist, +2 physical assistance, +2 safety/equipment Step pivot transfers: Max assist, +2 physical assistance, +2 safety/equipment      General transfer comment: Able to stand with MaxAx2 for boost-up and steadying with heavy posterior lean. Step pivot towards BSC with pt able to take steps. Pt then unable to take steps after BM with MaxAx2 for stand pivot with pt holding onto posterior elbow of RN/PT    Ambulation/Gait    General Gait Details: unable this date      Modified Rankin (Stroke Patients Only) Modified Rankin (Stroke Patients Only) Pre-Morbid Rankin Score: Moderately severe disability Modified Rankin: Moderately severe disability     Balance Overall balance assessment: Needs assistance, Mild deficits observed, not formally tested Sitting-balance support: Bilateral upper extremity supported, Feet supported Sitting balance-Leahy Scale: Fair   Postural control: Posterior lean Standing balance support: Bilateral upper extremity supported, During functional activity, Reliant on assistive device for balance Standing balance-Leahy Scale: Zero Standing balance  comment: unable to maintain balance without  MaxAx2       Pertinent Vitals/Pain Pain Assessment Pain Assessment: No/denies pain    Home Living Family/patient expects to be discharged to:: Private residence Living Arrangements: Other relatives (grandaughter works from home) Available Help at Discharge: Available PRN/intermittently Type of Home: House Home Access: Level entry     Home Layout: One level Home Equipment: Agricultural consultant (2 wheels);Rollator (4 wheels);BSC/3in1      Prior Function Prior Level of Function : Needs assist    Mobility Comments: ModI with RW prior to SNF admission, at SNF pt needed assist to stand with close guard while ambulating short distances ADLs Comments: Prior to SNF, pt needed assistance with bathing. At SNF needed assist with bathing, dressing, pericare     Extremity/Trunk Assessment   Upper Extremity Assessment Upper Extremity Assessment: Defer to OT evaluation    Lower Extremity Assessment Lower Extremity Assessment: Generalized weakness (Grossly 4+/5, alert to light touch)    Cervical / Trunk Assessment Cervical / Trunk Assessment: Other exceptions Cervical / Trunk Exceptions: increased body habitus  Communication   Communication Communication: Impaired Factors Affecting Communication: Hearing impaired    Cognition Arousal: Alert Behavior During Therapy: WFL for tasks assessed/performed   PT - Cognitive impairments: No apparent impairments, History of cognitive impairments    Following commands: Intact       Cueing Cueing Techniques: Verbal cues      PT Assessment Patient needs continued PT services  PT Problem List Decreased strength;Decreased activity tolerance;Decreased balance;Decreased mobility;Obesity       PT Treatment Interventions Gait training;Functional mobility training;Therapeutic activities;Therapeutic exercise;Balance training;Neuromuscular re-education;Patient/family education;DME instruction    PT Goals (Current goals can be found in the Care Plan  section)  Acute Rehab PT Goals Patient Stated Goal: to be able to move better PT Goal Formulation: With patient Time For Goal Achievement: 11/03/23 Potential to Achieve Goals: Good    Frequency Min 2X/week        AM-PAC PT "6 Clicks" Mobility  Outcome Measure Help needed turning from your back to your side while in a flat bed without using bedrails?: Total Help needed moving from lying on your back to sitting on the side of a flat bed without using bedrails?: Total Help needed moving to and from a bed to a chair (including a wheelchair)?: Total Help needed standing up from a chair using your arms (e.g., wheelchair or bedside chair)?: Total Help needed to walk in hospital room?: Total Help needed climbing 3-5 steps with a railing? : Total 6 Click Score: 6    End of Session Equipment Utilized During Treatment: Gait belt Activity Tolerance: Patient tolerated treatment well Patient left: in bed;with call bell/phone within reach Nurse Communication: Mobility status PT Visit Diagnosis: Muscle weakness (generalized) (M62.81);Difficulty in walking, not elsewhere classified (R26.2);Unsteadiness on feet (R26.81);Other abnormalities of gait and mobility (R26.89)    Time: 1610-9604 PT Time Calculation (min) (ACUTE ONLY): 31 min   Charges:   PT Evaluation $PT Eval Low Complexity: 1 Low PT Treatments $Therapeutic Activity: 8-22 mins PT General Charges $$ ACUTE PT VISIT: 1 Visit       Orysia Blas, PT, DPT Secure Chat Preferred  Rehab Office [phone]   Alexis Farmer 10/20/2023, 12:21 PM

## 2023-10-20 NOTE — ED Notes (Signed)
 patient's daughter Martina Sledge [phone]) requesting call from MD

## 2023-10-20 NOTE — Progress Notes (Signed)
 [facility] Progress Note Patient: Ayniah Gagen WJX:[id number] DOB: 1940/11/27 DOA: 10/19/2023  DOS: the patient was seen and examined on 10/20/2023  Brief Hospital Course: Patient with PMH of CKD 3B, type II DM, HTN, HFpEF, obesity, B-cell lymphoma, chronic anxiety, HLD presented to the hospital with complaints of numbness and weakness on left leg. Found to have acute stroke in the right frontal lobe in the left parietal lobe. Neurology was consulted.  Assessment and Plan: Acute ischemic infarct. Multifocal, right medial frontal lobe, right frontal operculum, medial frontal gyrus. Concern for embolic infarct versus large vessel stenosis. MRI as above. 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 Neurology was consulted. Hemoglobin A1c 8.7.  LDL 62.  Echocardiogram shows preserved EF of 60 to 65%. Patient not on any antithrombotic before admission.  Currently recommendation is for 81 mg aspirin  and Plavix 75 mg daily for 3 weeks followed by 81 mg aspirin  alone. Neurology recommending carotid Doppler to ensure better understanding of right ICA stenosis as that may require further workup. PT OT recommended home with home health.  Anxiety. Patient is taking Ativan  0.5 mg twice daily as needed at home. Family is concerned with regards to excessive sedation with regards to psychotropic medications. Patient currently wants to continue Ativan  0.5 mg twice daily as needed in the hospital. Family is aware of patient's decision and currently agrees with it.  HFpEF. HTN. Home medication includes Coreg , hydralazine , Aldactone  and torsemide . Volume status wise patient appears to be euvolemic for now. Blood pressure was elevated as her home medications were adjusted on the presentation of stroke to allow for permissive hypertension. Discussed with neurology, current blood pressure goal is less than 220/110 but can resume her home  medication. Will resume home dose of all the medications.  HLD. On Crestor .  Currently resumed.  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 Hemoglobin A1c 8.7. Currently on sliding scale insulin . Home regimen includes NPH 20 in the morning 15 in the evening. Patient was recently hospitalized and was on 20 units of Semglee . For now we will resume at 15 units nightly. For neuropathy patient uses gabapentin  as needed at home although prescribed as on scheduled basis. Given that the patient's uses only as needed and family has concerns with the cost of psychotropic medication I will currently hold this medication.  AKI on CKD 3B. Baseline serum creatinine appears to be around 2.2.  Currently serum creatinine is 2.57. Likely in the setting of poor p.o. intake. Potential for overdiuresis in the setting of euvolemic status right now. Will recheck labs. Her diuretics have been on hold to allow for renal recovery. Will likely resume torsemide  and Aldactone  tomorrow pending renal function.  Thrombocytosis. Appears to be chronic.  For now we will monitor.  Obesity Class 3 BMI 39.99. Placing the pt at higher risk of poor outcomes.  Hypothyroidism. TSH and free T4 within normal limit. Will continue to monitor for now.  Goal of care. Updated CODE STATUS to full code as it was prior only. Had extensive discussion with family with regards to their concerns with risk of overuse of benzodiazepine and pain medication. Recommended that we can add those medications in her allergy list if patient was unable to tolerate them well in the past.  Family currently wants to hold off on that as if indicated they would like her to receive this medication. Family also currently agrees with the use of  the lorazepam  0.5 mg twice daily as needed as prescribed by patient's outpatient provider to continue to help with her anxiety in the hospital.     Subjective: Denies any acute complaint.  Reports intermittent anxiety as well as intermittent numbness of her left cheek area.  No nausea no vomiting.  No chest pain.  No abdominal pain.  No diarrhea no constipation.  Physical Exam: General: in Mild distress, No Rash, affect appropriate for now.  (She says she is calm now) Cardiovascular: S1 and S2 Present, No Murmur Respiratory: Good respiratory effort, Bilateral Air entry present. No Crackles, No wheezes Abdomen: Bowel Sound present, No tenderness Extremities: Trace edema Neuro: Alert and oriented x3, no dysarthria.  EOM intact.  No facial droop.  Left-sided mild weakness.  Left facial numbness on my exam and right upper and lower extremity numbness.  Data Reviewed: I have Reviewed nursing notes, Vitals, and Lab results. Since last encounter, pertinent lab results CBC and a BMP   . I have ordered test including CBC and BMP  . I have discussed pt's care plan and test results with neurology  .  Disposition: Status is: Inpatient Remains inpatient appropriate because: Monitor for further workup and neurology  heparin  injection 5,000 Units Start: 10/19/23 2200   Family Communication: Discussed with daughter and granddaughter on the phone. Level of care: Telemetry Cardiac   Vitals:   10/20/23 1300 10/20/23 1500 10/20/23 1602 10/20/23 1605  BP: (!) 189/86 (!) 135/55  (!) 181/105  Pulse: 67 71  79  Resp: 19 16  16   Temp:  98 F (36.7 C)    TempSrc:  Oral Oral   SpO2: 100% 95%  99%     Author: Charlean Congress, MD 10/20/2023 7:06 PM  Please look on [email or web address] to find out who is on call.

## 2023-10-20 NOTE — Progress Notes (Addendum)
 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  MR angiogram shows possible moderate stenosis in the neck but is suboptimal due to motion artifacts.  Recommend check carotid US  and if moderate stenosis is confirmed may need referral to vascular surgery..  PT recommending home health. Concern for embolic event vs large vessel due to carotid stenosis.  OBJECTIVE  CBC    Component Value Date/Time   WBC 8.6 10/19/2023 1109   RBC 4.13 10/19/2023 1109   HGB 11.8 (L) 10/19/2023 1109   HGB 11.6 (L) 07/15/2023 1257   HCT 36.8 10/19/2023 1109   PLT 591 (H) 10/19/2023 1109   PLT 387 07/15/2023 1257   MCV 89.1 10/19/2023 1109   MCH 28.6 10/19/2023 1109   MCHC 32.1 10/19/2023 1109   RDW 14.3 10/19/2023 1109   LYMPHSABS 4.2 (H) 10/19/2023 1109   MONOABS 0.8 10/19/2023 1109   EOSABS 0.2 10/19/2023 1109   BASOSABS 0.1 10/19/2023 1109    BMET    Component Value Date/Time   NA 131 (L) 10/19/2023 1109   NA 138 02/19/2019 0000   K 4.0 10/19/2023 1109   CL 91 (L) 10/19/2023 1109   CO2 28 10/19/2023 1109   GLUCOSE 202 (H) 10/19/2023 1109   BUN 42 (H) 10/19/2023 1109   BUN 11 02/19/2019 0000   CREATININE 2.57 (H) 10/19/2023 1109   CREATININE 1.42 (H) 07/15/2023 1257   CREATININE 1.52 (H) 02/06/2019 1447   CALCIUM  9.1 10/19/2023 1109   EGFR 32.0 07/25/2023 0801   GFRNONAA 18 (L) 10/19/2023 1109   GFRNONAA 36 (L) 07/15/2023 1257    IMAGING past 24 hours MR ANGIO HEAD WO CONTRAST Result Date: 10/19/2023 CLINICAL DATA:  Stroke, follow up EXAM: MRA NECK WITHOUT CONTRAST MRA HEAD WITHOUT CONTRAST TECHNIQUE: Angiographic images of the Circle of Willis were acquired using MRA technique without intravenous contrast. COMPARISON:  CTA head/neck May 6, 24. FINDINGS: MRA NECK FINDINGS Motion limited. Aortic arch: Great vessel origins are patent.  Right carotid system: Patent with likely at least moderate stenosis of the proximal ICA. Motion limits assessment. Left carotid system: Motion limited evaluation without definite hemodynamically significant stenosis. Vertebral arteries: Absence of flow related signal in the right vertebral artery, suggesting occlusion or proximal high-grade stenosis. Left vertebral artery appears patent without significant stenosis. MRA HEAD FINDINGS Anterior circulation: Bilateral intracranial ICAs, MCAs and ACAs are patent without proximal hemodynamically significant stenosis. Posterior circulation: Diminished but preserved flow related signal in the right intradural vertebral artery. Left intradural vertebral artery is patent without significant stenosis. Basilar artery and bilateral posterior cerebral arteries are patent without proximal hemodynamically significant stenosis. IMPRESSION: 1. Absence of flow related signal in the right vertebral artery, suggesting occlusion or proximal high-grade stenosis. Reconstitution intradurally. 2. 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 3. Given the above, consider CTA to better confirm and better assess. 4. No large vessel occlusion or proximal hemodynamically significant stenosis in the head. Electronically Signed   By: Stevenson Elbe M.D.   On: 10/19/2023 19:16   MR ANGIO NECK WO CONTRAST Result Date: 10/19/2023 CLINICAL DATA:  Stroke, follow up EXAM: MRA NECK WITHOUT CONTRAST MRA HEAD WITHOUT CONTRAST TECHNIQUE: Angiographic images of the Circle of Willis were acquired using MRA technique without intravenous contrast. COMPARISON:  CTA head/neck May 6, 24. FINDINGS: MRA NECK FINDINGS Motion  limited. Aortic arch: Great vessel origins are patent. Right carotid system: Patent with likely at least moderate stenosis of the proximal ICA. Motion limits assessment. Left carotid system: Motion limited evaluation without definite  hemodynamically significant stenosis. Vertebral arteries: Absence of flow related signal in the right vertebral artery, suggesting occlusion or proximal high-grade stenosis. Left vertebral artery appears patent without significant stenosis. MRA HEAD FINDINGS Anterior circulation: Bilateral intracranial ICAs, MCAs and ACAs are patent without proximal hemodynamically significant stenosis. Posterior circulation: Diminished but preserved flow related signal in the right intradural vertebral artery. Left intradural vertebral artery is patent without significant stenosis. Basilar artery and bilateral posterior cerebral arteries are patent without proximal hemodynamically significant stenosis. IMPRESSION: 1. Absence of flow related signal in the right vertebral artery, suggesting occlusion or proximal high-grade stenosis. Reconstitution intradurally. 2. 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 3. Given the above, consider CTA to better confirm and better assess. 4. No large vessel occlusion or proximal hemodynamically significant stenosis in the head. Electronically Signed   By: Stevenson Elbe M.D.   On: 10/19/2023 19:16   MR BRAIN WO CONTRAST Result Date: 10/19/2023 CLINICAL DATA:  Neuro deficit, acute, stroke suspected. Numbness and weakness in the left lower extremity. Abnormal gait. Last known well at 7 p.m. yesterday. EXAM: MRI HEAD WITHOUT CONTRAST TECHNIQUE: Multiplanar, multiecho pulse sequences of the brain and surrounding structures were obtained without intravenous contrast. COMPARISON:  CT head without contrast 08/03/2023. MR head without contrast 11/27/2022. FINDINGS: Brain: Acute nonhemorrhagic cortical infarct is present in the medial right frontal lobe. T2 and FLAIR signal hyperintensities are associated. A 10 mm cortical infarct is present in the medial left parietal lobe. Punctate cortical infarct is present in the right precentral gyrus on  image 39 of series 2. Additional punctate infarcts are present in the right frontal operculum and right middle frontal gyrus. Separate from the acute/subacute infarcts is a stable. Confluent periventricular and subcortical T2 hyperintensities, advanced for age. A late subacute infarct is present in the lateral right occipital lobe. No acute hemorrhage or mass lesion is present. The ventricles are proportionate to the degree of atrophy. No significant extraaxial fluid collection is present. Vascular: Flow is present in the major intracranial arteries. Skull and upper cervical spine: The craniocervical junction is normal. Upper cervical spine is within normal limits. Marrow signal is unremarkable. Sinuses/Orbits: The paranasal sinuses and mastoid air cells are clear. Bilateral lens replacements are noted. Globes and orbits are otherwise unremarkable. IMPRESSION: 1. 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 2. 10 mm cortical infarct in the medial left parietal lobe. 3. Punctate cortical infarcts in the right precentral gyrus, right frontal operculum, and right middle frontal gyrus. 4. Late subacute infarct in the lateral right occipital lobe. 5. Stable background of confluent periventricular and subcortical T2 hyperintensities bilaterally, advanced for age. This likely reflects the sequela of chronic microvascular ischemia. These results were called by telephone at the time of interpretation on 10/19/2023 at 4:31 pm to provider [name] , who verbally acknowledged these results. Electronically Signed   By: Audree Leas M.D.   On: 10/19/2023 16:32    Vitals:   10/19/23 2043 10/19/23 2138 10/20/23 0530 10/20/23 0558  BP: 126/75 (!) 171/62 (!) 127/54 (!) 127/54  Pulse: 77  65   Resp: 17  15   Temp:   98.2 F (36.8 C)   TempSrc:   Oral   SpO2: 98%  97%  PHYSICAL EXAM General:  Alert, well-nourished, well-developed patient in no acute distress Psych:  Mood and  affect appropriate for situation CV: Regular rate and rhythm on monitor Respiratory:  Regular, unlabored respirations on room air GI: Abdomen soft and nontender   NEURO:  Mental Status: AA&Ox3, patient is able to give clear and coherent history Speech/Language: speech is without dysarthria or aphasia.  Naming, repetition, fluency, and comprehension intact.  Cranial Nerves:  II: PERRL. Visual fields full.  III, IV, VI: EOMI. Eyelids elevate symmetrically.  V: Sensation is intact to light touch and symmetrical to face.  VII: Face is symmetrical resting and smiling VIII: hearing intact to voice. IX, X: Palate elevates symmetrically. Phonation is normal.  WU:[id number] shrug 5/5. XII: tongue is midline without fasciculations. Motor:  Left hand grasp weak Left lower extremity - endorses heavy feeling, 4/5 RUE sand RLE full strength  Tone: is normal and bulk is normal Sensation- Intact to light touch bilaterally. Extinction absent to light touch to DSS.   Coordination: Left fine finger movements Gait- deferred  Most Recent NIH 3   ASSESSMENT/PLAN  [name] is a 83 y.o. female with history ofmonoclonal B-cell lymphoma, stage IIIb CKD, DM, HTN, HFpEF, chronic pain, obesity, presenting with numbness and weakness in left lower extremity difficulty walking.  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 Reconstitution intradurally. 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 Carotid Doppler  Pending  2D Echo EF 60-65% LDL 62 HgbA1c  8.7 VTE prophylaxis - Heparin  No antithrombotic (not taking ASA consistently) prior to admission, now on aspirin  81 mg daily and clopidogrel 75 mg daily for 3 weeks and then ASA 81mg  alone. Therapy recommendations:  Home Health PT and Home Health OT Disposition:  Pending  HFpEF Hypertension Home meds:  Coreg ,hydralazine , spironolactone , Demadex   Stable- resume home meds Blood Pressure Goal: BP less than 220/110   Hyperlipidemia Home meds:  Crestor , resumed in hospital LDL 62, goal < 70 Continue statin at discharge  Diabetes type II Uncontrolled Home meds:  Insulin  HgbA1c 8.7, goal < 7.0 CBGs, SSI Recommend close follow-up with PCP for better DM control  Dysphagia Patient has post-stroke dysphagia, SLP consulted    Diet   Diet renal/carb modified with fluid restriction Diet-HS Snack? Nothing; Fluid restriction: 1200 mL Fluid; Room service appropriate? Yes; Fluid consistency: Thin   Advance diet as tolerated  Other Stroke Risk Factors Obesity, 39.99, BMI >/= 30 associated with increased stroke risk, recommend weight loss, diet and exercise as appropriate   Other Active Problems CKD IIIb Cr 2.57, GFR 18 Hypothyroidism TSH and T4 [facility]  Hospital day # 1  Patient seen and examined by NP/APP with MD. MD to update note as needed.   Alexis Mana, DNP, FNP-BC [facility] Pager: [phone]  I have personally obtained history,examined this patient, reviewed notes, independently viewed imaging studies, participated in medical decision making and plan of care.ROS completed by me personally and pertinent positives fully documented  I have made any additions or clarifications directly to the above note. Agree with note above.  Patient presents in clinic with  leg weakness and gait difficulties due to embolic right MCA branch infarcts possibly from embolization from moderate proximal right ICA stenosis.  Recommend check carotid ultrasound to confirm this and consider  referral to vascular surgery.  Dual antiplatelet therapy aspirin  and Plavix for 3 weeks followed by aspirin  alone and aggressive risk factor modification.  Long discussion with patient and Dr. Lydia Sams and answered questions.  Greater than 50% time during this 50-minute visit with counseling coordination of care discussion with patient and care team and answering questions.  Alexis Beaver, MD Medical Director [facility] Pager: [phone] 10/20/2023 2:57 PM   To contact Stroke Continuity provider, please refer to [email or web address]. After hours, contact General Neurology

## 2023-10-20 NOTE — Evaluation (Signed)
 Speech Language Pathology Evaluation Patient Details Name: Alexis Farmer MRN: [id number] DOB: Mar 17, 1941 Today's Date: 10/20/2023 Time: 4696-2952 SLP Time Calculation (min) (ACUTE ONLY): 13 min  Problem List:  Patient Active Problem List   Diagnosis Date Noted   Left-sided weakness 10/19/2023   Shortness of breath 10/02/2023   Morbid obesity (HCC) 10/02/2023   Severe pulmonary hypertension (HCC) 10/01/2023   Congestive heart failure (HCC) 09/29/2023   RSV infection 08/08/2023   Obesity (BMI 30-39.9) 08/04/2023   Diffuse pain 08/03/2023   Abdominal pain 08/03/2023   Abdominal pain, LLQ 08/03/2023   Cellulitis of left lower extremity 04/02/2023   Hyponatremia 04/01/2023   Acute on chronic heart failure with preserved ejection fraction (HFpEF) (HCC) 04/01/2023   Cellulitis 04/01/2023   Lower extremity weakness 04/01/2023   Inability to access health care due to transportation insecurity 03/31/2023   Mood disorder (HCC) 01/20/2023   Vision loss, bilateral 10/22/2022   Pre-syncope 10/22/2022   Ceruminosis, right 09/07/2022   Type 2 diabetes mellitus without complications (HCC) 08/14/2022   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/2022   Hallucinations, visual 07/20/2022   Need for pneumococcal vaccination 07/20/2022   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   Chronic renal failure (CRF), stage 3b (HCC) 03/21/2022   Ileus (HCC) 08/22/2021   Lymphedema 04/19/2021   Atherosclerosis of aorta (HCC) 01/16/2021   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/16/2021   Spinal stenosis of lumbar region 01/16/2021   Panic attack 01/15/2021   Hypertensive urgency 12/15/2020   Other chronic pain 08/25/2020   Hypertension associated with diabetes (HCC) 08/25/2020   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) 05/30/2020   Retinopathy 05/30/2020   Multinodular goiter 02/04/2020   Proteinuria 12/22/2019    Physical deconditioning 08/13/2019   Abdominal aortic atherosclerosis (HCC) 07/24/2019   Obesity, Class III, BMI 40-49.9 (morbid obesity) 06/09/2019   Hypercalcemia 03/18/2019   Acute renal failure superimposed on stage 3b chronic kidney disease (HCC) 02/06/2019   Round hole, unspecified eye 02/06/2019   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 08/13/2018   Abnormal gait 03/26/2018   Arthritis 03/26/2018   Thyroid  nodule 01/20/2018   Cerumen impaction 01/21/2017   UTI (urinary tract infection) 09/24/2016   Rectal hemorrhage 07/29/2016   Carotid artery stenosis 07/19/2016   PAD (peripheral artery disease) (HCC) 07/19/2016   Chronic diastolic CHF (congestive heart failure) (HCC) 05/31/2016   Chronic fatigue 11/15/2015   Depression, recurrent (HCC) 10/13/2015   Osteoarthritis 10/13/2015   Mixed hyperlipidemia 08/26/2014   Uncontrolled type 2 diabetes mellitus with hyperglycemia, with long-term current use of insulin  (HCC) 04/08/2014   Diabetic polyneuropathy (HCC) 04/08/2014   Type 2 diabetes mellitus with other diabetic kidney complication (HCC) 04/08/2014   Past Medical History:  Past Medical History:  Diagnosis Date   Anxiety 09/13/2015   Arthritis    Atypical chest pain 01/15/2021   [place]head 10/24/2021   Bradycardia 07/17/2016   Formatting of this note might be different from the original.  Last Assessment & Plan:   Found to be bradycardic at outside hospital. They stopped metoprolol  and verapamil  and heart rate has been normal since then. Echo appears to have been reassuring at the outside facility. Appears to be doing much better with regards to this. She'll continue to monitor for recurrent symptoms. She'll continue to   Cervical spondylosis with radiculopathy 01/26/2017   Formatting of this note might be different  from the original.  Last Assessment & Plan:   Continues to have issues  with this.  She is following with a specialist and it sounds as though they are planning on an MRI.  Benign exam today.   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 Rudean Corrente of this note might be different from the original.  Dr. Zelda Hickman      Last Assessment & Plan:   Recheck kidney function today.   Constipation 11/17/2019   Cough 01/25/2016   Formatting of this note might be different from the original.  Last Assessment & Plan:   Minimal nighttime cough. Improves when she washes her pillows consistently. Suspect allergies contributing. She'll monitor.   Diabetes mellitus without complication (HCC)    one elevated reading/ no treatment   Disorder of rotator cuff 06/24/2018   Diverticulitis    Dysuria 03/22/2017   Formatting of this note might be different from the original.  Last Assessment & Plan:   Symptoms concerning for UTI.  Will check urinalysis.   Edema of foot 04/08/2014   Formatting of this note might be different from the original.  Last Assessment & Plan:   Chronic pedal edema. Suspect venous insufficiency. No orthopnea or shortness of breath. Advised elevation of her legs. Consider compression stockings in the future.   Elevated troponin 12/15/2020   Essential hypertension 04/08/2014   Formatting of this note might be different from the original.  Last Assessment & Plan:   Well-controlled on recheck.  Continue current regimen.   Fall 03/26/2018   Gastroenteritis 08/12/2021   Gastrointestinal hemorrhage 08/03/2015   GI bleed    High cholesterol    History of blood transfusion    Hyperglycemia due to type 2 diabetes mellitus (HCC) 03/11/2015   Hypertension    Hypertensive urgency 12/15/2020   Low back pain 04/08/2014   Morbid obesity (HCC) 04/08/2014   Formatting of this note might be different from the original.  Last Assessment & Plan:   Weight is stable. Discussed diet and exercise at length. Encouraged whatever exercise she can do.  Given diet information.   Palpitations 12/15/2020   Peripheral edema 12/22/2019   Postmenopausal bleeding 07/17/2016   Formatting of this note might be different from the original.  Last Assessment & Plan:   Recent D&C. Following with gynecology. Benign findings. Monitor for recurrence.   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  Last Assessment & Plan:   Continues to have issues with this.  She is following with a specialist and it sounds as though they are planning on an MRI.  Benign exam today.   Recurrent falls 08/13/2019   Renal insufficiency    Skin cyst 10/24/2021   Stage 3a chronic kidney disease (HCC) 01/15/2021   Swelling of lower leg 07/19/2016   Vertigo 10/13/2015   Formatting of this note might be different from the original.  Last Assessment & Plan:   No recurrence. Discussed that meclizine  is an as needed medication and that she does not need to take this daily. She will continue to monitor.   Past Surgical History:  Past Surgical History:  Procedure Laterality Date   APPENDECTOMY     CHOLECYSTECTOMY     ECTOPIC PREGNANCY SURGERY     EYE SURGERY     bilateral cataracts  EYE SURGERY     02/11/2019 repair hole in right eye    gallbladder      HYSTEROSCOPY WITH D & C N/A 10/26/2016   Procedure: DILATATION AND CURETTAGE /HYSTEROSCOPY;  Surgeon: Prescilla Brod, MD;  Location: [facility] ORS;  Service: Gynecology;  Laterality: N/A;   HYSTEROSCOPY WITH D & C N/A 07/07/2018   Procedure: DILATATION AND CURETTAGE /HYSTEROSCOPY;  Surgeon: Prescilla Brod, MD;  Location: [facility] ORS;  Service: Gynecology;  Laterality: N/A;   RIGHT HEART CATH Bilateral 10/10/2023   Procedure: RIGHT HEART CATH;  Surgeon: Mardell Shade, MD;  Location: [facility] INVASIVE CV LAB;  Service: Cardiovascular;  Laterality: Bilateral;   THYROIDECTOMY, PARTIAL      HPI:  Amery Rizo is a 83 y.o. female with past medical history  of  monoclonal B-cell lymphoma, stage IIIb CKD, DM, HTN, HFpEF, chronic pain, obesity, presenting with numbness and weakness in left lower extremity difficulty walking.  Last known well was 1900 yesterday currently patient reports bilateral leg numbness and right hand numbness.  She was recently discharged after being admitted for congestive heart failure exacerbation.  No reports of chest pain shortness of breath fevers nausea vomiting diarrhea.  MRI of her head is negative for acute findings.   Assessment / Plan / Recommendation Clinical Impression  Cognitive/linguistic evaluation and motor speech screen were completed.  Cranial nerve exam was completed and remarkable for reduced sensation on the left side of her face including her forehead and cheek.  Lingual, labial, facial and jaw range of motion and strength appeared to be adequate.  Speech was clear and easy to understand.  No discernible dysarthria or apraxia.  Portions of the Mini Mental State Exam were administered.  She achieved a score of 18/27.  She was fully oriented to person, place and situation.  She was partially oriented to time stating the year, month and day of week accurately.  She was unable to state the date accurately.  She had good immediate recall of three novel words. However, given a short delay semantic cue was required to facilitate recall of the 3 novel words.  Mild deficit noted for attention task.  She was able to name objects and repeat a short sentence.  She struggled to follow a 3 step command.  Given results of this evaluation ST will follow for therapy.   She may benefit from ongoing ST after DC.    SLP Assessment  SLP Recommendation/Assessment: Patient needs continued Speech Lanaguage Pathology Services SLP Visit Diagnosis: Cognitive communication deficit (R41.841)    Recommendations for follow up therapy are one component of a  multi-disciplinary discharge planning process, led by the attending physician.  Recommendations may be updated based on patient status, additional functional criteria and insurance authorization.    Follow Up Recommendations   (ST at next level of care.)    Assistance Recommended at Discharge  Intermittent Supervision/Assistance  Functional Status Assessment Patient has had a recent decline in their functional status and demonstrates the ability to make significant improvements in function in a reasonable and predictable amount of time.  Frequency and Duration min 1 x/week  2 weeks      SLP Evaluation Cognition  Overall Cognitive Status: Impaired/Different from baseline Arousal/Alertness: Awake/alert Orientation Level: Oriented to person;Oriented to place;Oriented to situation;Disoriented to time Attention: Focused Focused Attention: Impaired Focused Attention Impairment: Verbal basic Memory: Impaired Memory Impairment: Decreased recall of new information       Comprehension  Auditory Comprehension Overall Auditory Comprehension: Impaired Commands: Impaired  Complex Commands: 50-74% accurate Conversation: Simple    Expression Expression Primary Mode of Expression: Verbal Verbal Expression Overall Verbal Expression: Appears within functional limits for tasks assessed Initiation: No impairment Automatic Speech: Name;Social Response Level of Generative/Spontaneous Verbalization: Conversation Repetition: No impairment Naming: No impairment Pragmatics: No impairment Non-Verbal Means of Communication: Not applicable   Oral / Motor  Oral Motor/Sensory Function Overall Oral Motor/Sensory Function: Mild impairment Facial ROM: Within Functional Limits Facial Symmetry: Within Functional Limits Facial Strength: Within Functional Limits Facial Sensation: Reduced left;Suspected CN V (Trigeminal) dysfunction Lingual ROM: Within Functional Limits Lingual Symmetry: Within Functional  Limits Lingual Strength: Within Functional Limits Mandible: Within Functional Limits Motor Speech Overall Motor Speech: Appears within functional limits for tasks assessed Articulation: Within functional limitis Intelligibility: Intelligible Motor Planning: Witnin functional limits Motor Speech Errors: Not applicable            Thomes Flicker, MA, CCC-SLP Acute Rehab SLP [phone]  Elner Hahn 10/20/2023, 1:32 PM

## 2023-10-21 ENCOUNTER — Ambulatory Visit: Payer: Medicare Other | Admitting: Cardiovascular Disease

## 2023-10-21 ENCOUNTER — Inpatient Hospital Stay (HOSPITAL_COMMUNITY)

## 2023-10-21 ENCOUNTER — Encounter (HOSPITAL_COMMUNITY): Payer: Self-pay | Admitting: Internal Medicine

## 2023-10-21 DIAGNOSIS — I272 Pulmonary hypertension, unspecified: Secondary | ICD-10-CM

## 2023-10-21 DIAGNOSIS — R531 Weakness: Secondary | ICD-10-CM | POA: Diagnosis not present

## 2023-10-21 DIAGNOSIS — I5033 Acute on chronic diastolic (congestive) heart failure: Secondary | ICD-10-CM

## 2023-10-21 DIAGNOSIS — E785 Hyperlipidemia, unspecified: Secondary | ICD-10-CM

## 2023-10-21 DIAGNOSIS — I5032 Chronic diastolic (congestive) heart failure: Secondary | ICD-10-CM

## 2023-10-21 DIAGNOSIS — I739 Peripheral vascular disease, unspecified: Secondary | ICD-10-CM

## 2023-10-21 DIAGNOSIS — R29703 NIHSS score 3: Secondary | ICD-10-CM | POA: Diagnosis not present

## 2023-10-21 DIAGNOSIS — I63411 Cerebral infarction due to embolism of right middle cerebral artery: Secondary | ICD-10-CM | POA: Diagnosis not present

## 2023-10-21 DIAGNOSIS — I130 Hypertensive heart and chronic kidney disease with heart failure and stage 1 through stage 4 chronic kidney disease, or unspecified chronic kidney disease: Secondary | ICD-10-CM | POA: Diagnosis not present

## 2023-10-21 DIAGNOSIS — I700 Atherosclerosis of aorta: Secondary | ICD-10-CM

## 2023-10-21 DIAGNOSIS — I152 Hypertension secondary to endocrine disorders: Secondary | ICD-10-CM

## 2023-10-21 DIAGNOSIS — R002 Palpitations: Secondary | ICD-10-CM

## 2023-10-21 DIAGNOSIS — I639 Cerebral infarction, unspecified: Secondary | ICD-10-CM | POA: Diagnosis not present

## 2023-10-21 DIAGNOSIS — I6523 Occlusion and stenosis of bilateral carotid arteries: Secondary | ICD-10-CM

## 2023-10-21 DIAGNOSIS — I69391 Dysphagia following cerebral infarction: Secondary | ICD-10-CM | POA: Diagnosis not present

## 2023-10-21 LAB — CBC
HCT: 37.3 % (ref 36.0–46.0)
Hemoglobin: 11.9 g/dL — ABNORMAL LOW (ref 12.0–15.0)
MCH: 28.7 pg (ref 26.0–34.0)
MCHC: 31.9 g/dL (ref 30.0–36.0)
MCV: 89.9 fL (ref 80.0–100.0)
Platelets: 546 10*3/uL — ABNORMAL HIGH (ref 150–400)
RBC: 4.15 MIL/uL (ref 3.87–5.11)
RDW: 14.6 % (ref 11.5–15.5)
WBC: 8.6 10*3/uL (ref 4.0–10.5)
nRBC: 0 % (ref 0.0–0.2)

## 2023-10-21 LAB — RENAL FUNCTION PANEL
Albumin: 2.7 g/dL — ABNORMAL LOW (ref 3.5–5.0)
Anion gap: 10 (ref 5–15)
BUN: 32 mg/dL — ABNORMAL HIGH (ref 8–23)
CO2: 25 mmol/L (ref 22–32)
Calcium: 9.1 mg/dL (ref 8.9–10.3)
Chloride: 98 mmol/L (ref 98–111)
Creatinine, Ser: 1.79 mg/dL — ABNORMAL HIGH (ref 0.44–1.00)
GFR, Estimated: 28 mL/min — ABNORMAL LOW (ref >60)
Glucose, Bld: 252 mg/dL — ABNORMAL HIGH (ref 70–99)
Phosphorus: 3.4 mg/dL (ref 2.5–4.6)
Potassium: 4.5 mmol/L (ref 3.5–5.1)
Sodium: 133 mmol/L — ABNORMAL LOW (ref 135–145)

## 2023-10-21 LAB — GLUCOSE, CAPILLARY
Glucose-Capillary: 281 mg/dL — ABNORMAL HIGH (ref 70–99)
Glucose-Capillary: 315 mg/dL — ABNORMAL HIGH (ref 70–99)
Glucose-Capillary: 351 mg/dL — ABNORMAL HIGH (ref 70–99)
Glucose-Capillary: 188 mg/dL — ABNORMAL HIGH (ref 70–99)

## 2023-10-21 LAB — MAGNESIUM: Magnesium: 1.8 mg/dL (ref 1.7–2.4)

## 2023-10-21 MED ORDER — BENZONATATE 100 MG PO CAPS
200.0000 mg | ORAL_CAPSULE | Freq: Three times a day (TID) | ORAL | Status: AC | PRN
  Administered 2023-10-21 – 2023-10-23 (×4): 200 mg via ORAL
  Filled 2023-10-21 (×4): qty 2

## 2023-10-21 MED ORDER — CARVEDILOL 6.25 MG PO TABS
6.2500 mg | ORAL_TABLET | Freq: Two times a day (BID) | ORAL | Status: DC
  Administered 2023-10-21: 6.25 mg via ORAL
  Filled 2023-10-21 (×2): qty 1

## 2023-10-21 MED ORDER — LORAZEPAM 0.5 MG PO TABS
0.5000 mg | ORAL_TABLET | Freq: Two times a day (BID) | ORAL | Status: DC | PRN
Start: 2023-10-21 — End: 2023-10-24
  Administered 2023-10-22: 0.5 mg via ORAL
  Filled 2023-10-21: qty 1

## 2023-10-21 MED ORDER — IPRATROPIUM-ALBUTEROL 0.5-2.5 (3) MG/3ML IN SOLN: 3.0000 mL | RESPIRATORY_TRACT | Status: DC

## 2023-10-21 MED ORDER — POLYETHYLENE GLYCOL 3350 17 G PO PACK
17.0000 g | PACK | Freq: Every day | ORAL | Status: DC | PRN
  Administered 2023-10-21: 17 g via ORAL
  Filled 2023-10-21: qty 1

## 2023-10-21 MED ORDER — POLYETHYLENE GLYCOL 3350 17 G PO PACK
17.0000 g | PACK | Freq: Every day | ORAL | Status: DC
  Administered 2023-10-22: 17 g via ORAL
  Filled 2023-10-21: qty 1

## 2023-10-21 MED ORDER — CARVEDILOL 3.125 MG PO TABS
3.1250 mg | ORAL_TABLET | Freq: Once | ORAL | Status: AC
  Administered 2023-10-21: 3.125 mg via ORAL
  Filled 2023-10-21: qty 1

## 2023-10-21 NOTE — TOC Initial Note (Signed)
 Transition of Care [facility]) - Initial/Assessment Note    Patient Details  Name: Alexis Farmer MRN: [id number] Date of Birth: 02-Aug-1940  [facility]) CM/SW Contact:    Carmon Christen, LCSWA Phone Number: 10/21/2023, 12:06 PM  Clinical Narrative:                  CSW received consult for possible SNF placement at time of discharge. CSW spoke with patient regarding PT recommendation of SNF placement at time of discharge. Patient reports PTA patient comes from [facility] short term.Patient expressed understanding of PT recommendation and is agreeable to SNF placement at time of discharge. Patient confirmed she would like to return to [facility] for short term rehab.CSW discussed insurance authorization process.No further questions reported at this time. CSW spoke with Darrien with [facility] who confirmed SNF bed for patient.CSW to continue to follow and assist with discharge planning needs.   Expected Discharge Plan: Skilled Nursing Facility Barriers to Discharge: Continued Medical Work up   Patient Goals and CMS Choice Patient states their goals for this hospitalization and ongoing recovery are:: SNF   Choice offered to / list presented to : Patient      Expected Discharge Plan and Services In-house Referral: Clinical Social Work     Living arrangements for the past 2 months:  (came from [facility] short term,lives at home with Granddaughter)                                      Prior Living Arrangements/Services Living arrangements for the past 2 months:  (came from [facility] short term,lives at home with Granddaughter) Lives with:: Other (Comment) (Granddaughter) Patient language and need for interpreter reviewed:: Yes Do you feel safe going back to the place where you live?: No   SNF  Need for Family Participation in Patient Care: Yes (Comment) Care giver support system in place?: Yes (comment)   Criminal Activity/Legal Involvement Pertinent  to Current Situation/Hospitalization: No - Comment as needed  Activities of Daily Living   ADL Screening (condition at time of admission) Independently performs ADLs?: No Does the patient have a NEW difficulty with bathing/dressing/toileting/self-feeding that is expected to last >3 days?: Yes (Initiates electronic notice to provider for possible OT consult) Does the patient have a NEW difficulty with getting in/out of bed, walking, or climbing stairs that is expected to last >3 days?: Yes (Initiates electronic notice to provider for possible PT consult) Does the patient have a NEW difficulty with communication that is expected to last >3 days?: No Is the patient deaf or have difficulty hearing?: No Does the patient have difficulty seeing, even when wearing glasses/contacts?: No Does the patient have difficulty concentrating, remembering, or making decisions?: No  Permission Sought/Granted Permission sought to share information with : Case Manager, Family Supports, Magazine features editor Permission granted to share information with : Yes, Verbal Permission Granted  Share Information with NAME: Duke Gibbons  Permission granted to share info w AGENCY: SNF  Permission granted to share info w Relationship: Granddaughter  Permission granted to share info w Contact Information: Duke Gibbons [phone]  Emotional Assessment   Attitude/Demeanor/Rapport: Gracious Affect (typically observed): Calm Orientation: : Oriented to Self, Oriented to Place, Oriented to  Time, Oriented to Situation Alcohol / Substance Use: Not Applicable Psych Involvement: No (comment)  Admission diagnosis:  Left-sided weakness [R53.1] Cerebrovascular accident (CVA), unspecified mechanism (HCC) [I63.9] Patient Active Problem  List   Diagnosis Date Noted   Left-sided weakness 10/19/2023   Shortness of breath 10/02/2023   Morbid obesity (HCC) 10/02/2023   Severe pulmonary hypertension (HCC) 10/01/2023   Congestive  heart failure (HCC) 09/29/2023   RSV infection 08/08/2023   Obesity (BMI 30-39.9) 08/04/2023   Diffuse pain 08/03/2023   Abdominal pain 08/03/2023   Abdominal pain, LLQ 08/03/2023   Cellulitis of left lower extremity 04/02/2023   Hyponatremia 04/01/2023   Acute on chronic heart failure with preserved ejection fraction (HFpEF) (HCC) 04/01/2023   Cellulitis 04/01/2023   Lower extremity weakness 04/01/2023   Inability to access health care due to transportation insecurity 03/31/2023   Mood disorder (HCC) 01/20/2023   Vision loss, bilateral 10/22/2022   Pre-syncope 10/22/2022   Ceruminosis, right 09/07/2022   Type 2 diabetes mellitus without complications (HCC) 08/14/2022   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/2022   Hallucinations, visual 07/20/2022   Need for pneumococcal vaccination 07/20/2022   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   Chronic renal failure (CRF), stage 3b (HCC) 03/21/2022   Ileus (HCC) 08/22/2021   Lymphedema 04/19/2021   Atherosclerosis of aorta (HCC) 01/16/2021   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/16/2021   Spinal stenosis of lumbar region 01/16/2021   Panic attack 01/15/2021   Hypertensive urgency 12/15/2020   Other chronic pain 08/25/2020   Hypertension associated with diabetes (HCC) 08/25/2020   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) 05/30/2020   Retinopathy 05/30/2020   Multinodular goiter 02/04/2020   Proteinuria 12/22/2019   Physical deconditioning 08/13/2019   Abdominal aortic atherosclerosis (HCC) 07/24/2019   Obesity, Class III, BMI 40-49.9 (morbid obesity) 06/09/2019   Hypercalcemia 03/18/2019   Acute renal failure superimposed on stage 3b chronic kidney disease (HCC) 02/06/2019   Round hole, unspecified eye 02/06/2019   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   Other transient cerebral ischemic attacks and related  syndromes 08/13/2018   Abnormal gait 03/26/2018   Arthritis 03/26/2018   Thyroid  nodule 01/20/2018   Cerumen impaction 01/21/2017   UTI (urinary tract infection) 09/24/2016   Rectal hemorrhage 07/29/2016   Carotid artery stenosis 07/19/2016   PAD (peripheral artery disease) (HCC) 07/19/2016   Chronic diastolic CHF (congestive heart failure) (HCC) 05/31/2016   Chronic fatigue 11/15/2015   Depression, recurrent (HCC) 10/13/2015   Osteoarthritis 10/13/2015   Mixed hyperlipidemia 08/26/2014   Uncontrolled type 2 diabetes mellitus with hyperglycemia, with long-term current use of insulin  (HCC) 04/08/2014   Diabetic polyneuropathy (HCC) 04/08/2014   Type 2 diabetes mellitus with other diabetic kidney complication (HCC) 04/08/2014   PCP:  Valli Gaw, MD Pharmacy:   [facility] [address], [place] - [address] [place] [address] Phone: [phone] Fax: [phone]  [id number] - [place], [place] - [address] [place] [address] Phone: [phone] Fax: [phone]     Social Drivers of Health (SDOH) Social History: SDOH Screenings   Food Insecurity: No Food Insecurity (10/21/2023)  Recent Concern: Food Insecurity - Food Insecurity Present (10/03/2023)  Housing: Low Risk  (10/21/2023)  Transportation Needs: No Transportation Needs (10/21/2023)  Recent Concern: Transportation Needs - Unmet Transportation Needs (09/23/2023)  Utilities: Not At Risk (10/21/2023)  Alcohol Screen: Low Risk  (11/29/2022)  Depression (PHQ2-9): High Risk (09/23/2023)  Financial Resource Strain: Medium Risk (10/03/2023)  Physical Activity: Inactive (10/03/2023)  Social Connections: Moderately Integrated (10/21/2023)  Stress: Stress Concern Present (11/29/2022)  Tobacco Use:  Low Risk  (10/21/2023)   SDOH Interventions:     Readmission Risk Interventions    10/03/2023   12:03 PM  Readmission Risk Prevention Plan  Transportation Screening Complete  PCP or  Specialist Appt within 3-5 Days Complete  HRI or Home Care Consult Complete  Social Work Consult for Recovery Care Planning/Counseling Complete  Palliative Care Screening Not Applicable  Medication Review Oceanographer) Complete

## 2023-10-21 NOTE — Progress Notes (Signed)
 [facility] Progress Note Patient: Stacee Gildner ZOX:[id number] DOB: 1941-05-26 DOA: 10/19/2023  DOS: the patient was seen and examined on 10/21/2023  Brief Hospital Course: Patient with PMH of CKD 3B, type II DM, HTN, HFpEF, obesity, B-cell lymphoma, chronic anxiety, HLD presented to the hospital with complaints of numbness and weakness on left leg. Found to have acute stroke in the right frontal lobe in the left parietal lobe. Neurology was consulted.  Assessment and Plan: Acute ischemic infarct. Multifocal, right medial frontal lobe, right frontal operculum, medial frontal gyrus. Concern for embolic infarct versus large vessel stenosis. MRI as above. 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 Neurology was consulted. Hemoglobin A1c 8.7.  LDL 62.  Echocardiogram shows preserved EF of 60 to 65%. Patient not on any antithrombotic before admission.  Currently recommendation is for 81 mg aspirin  and Plavix 75 mg daily for 3 weeks followed by 81 mg aspirin  alone. Neurology recommended carotid Doppler.  Perform later in the day shows bilateral ICA with 1 to 39% stenosis which is unchanged from prior carotid Dopplers. Will discuss with neurology with regards to further recommendation. PT OT recommending SNF.  Anxiety. Patient is taking Ativan  0.5 mg twice daily as needed at home. Family is concerned with regards to excessive sedation with regards to psychotropic medications. Patient currently wants to continue Ativan  0.5 mg twice daily as needed in the hospital. Family is aware of patient's decision and currently agrees with it.  HFpEF. HTN. Home medication includes Coreg , hydralazine , Aldactone  and torsemide . Volume status wise patient appears to be euvolemic for now. Blood pressure was elevated as her home medications were adjusted on the presentation of stroke to allow for permissive hypertension. Discussed with  neurology, current blood pressure goal is less than 220/110 but can resume her home medication. Will resume home dose of all the medications.  HLD. On Crestor .  Currently resumed.  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 Hemoglobin A1c 8.7. Currently on sliding scale insulin . Home regimen includes NPH 20 in the morning 15 in the evening. Patient was recently hospitalized and was on 20 units of Semglee . For now we will resume at 15 units nightly. For neuropathy patient uses gabapentin  as needed at home although prescribed as on scheduled basis. Given that the patient's uses only as needed and family has concerns with the cost of psychotropic medication I will currently hold this medication.  AKI on CKD 3B. Baseline serum creatinine appears to be around 2.2.  Currently serum creatinine is 2.57. Likely in the setting of poor p.o. intake. Potential for overdiuresis in the setting of euvolemic status right now. Resume torsemide  and Aldactone .  Thrombocytosis. Appears to be chronic.  For now we will monitor.  Obesity Class 3 BMI 39.99. Placing the pt at higher risk of poor outcomes.  Hypothyroidism. TSH and free T4 within normal limit. Will continue to monitor for now.  Goal of care. Updated CODE STATUS to full code as it was prior only. Had extensive discussion with family with regards to their concerns with risk of overuse of benzodiazepine and pain medication. Recommended that we can add those medications in her allergy list if patient was unable to tolerate them well in the past.  Family currently wants to hold off on that as if indicated they would like her to receive this medication. Family also currently agrees with the use of the lorazepam  0.5 mg twice daily as  needed as prescribed by patient's outpatient provider to continue to help with her anxiety in the hospital.    Subjective: No acute complaint.  No nausea  vomiting fever no chills.  Had some episode of anxiety earlier this morning.  Physical Exam: In mild distress. S1-S2 present Clear to auscultation.  Bowel sounds present. No edema. Alert, oriented.  Unchanged weakness.  Data Reviewed: I have Reviewed nursing notes, Vitals, and Lab results. Reviewed CBC and BMP  Disposition: Status is: Inpatient Remains inpatient appropriate because: Monitor for further workup and neurology  heparin  injection 5,000 Units Start: 10/19/23 2200   Family Communication: Discussed with daughter and granddaughter on the phone. Level of care: Telemetry Cardiac   Vitals:   10/21/23 1224 10/21/23 1650 10/21/23 1900 10/21/23 2022  BP: 111/88 134/69 (!) 78/56 109/67  Pulse: 92  88 92  Resp: 18  (!) 21 (!) 23  Temp: 97.9 F (36.6 C)  98.9 F (37.2 C)   TempSrc: Oral  Oral   SpO2: 98%  98% 93%  Weight:      Height:         Author: Charlean Congress, MD 10/21/2023 8:25 PM  Please look on [email or web address] to find out who is on call.

## 2023-10-21 NOTE — Progress Notes (Signed)
 Carotid arterial duplex completed. Please see CV Procedures for preliminary results.  Estanislao Heimlich, RVT 10/21/23 3:42 PM

## 2023-10-21 NOTE — Evaluation (Signed)
 Occupational Therapy Evaluation Patient Details Name: Alexis Farmer MRN: [id number] DOB: Mar 13, 1941 Today's Date: 10/21/2023   History of Present Illness   83 y.o. female [facility] to [facility] from SNF with with numbness and weakness in LLE and abnormal gait. Progressed to B LE numbness and R hand numbness. 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 Recent d/c for CHF exacerbation. PMHx: monoclonal B-cell lymphoma, stage IIIb CKD, DM, HTN, HFpEF, chronic pain, obesity, vertigo     Clinical Impressions Pt presents with decline in function and safety with ADLs and ADL mobility with impaired strength, balance and endurance. PTA pt was at [facility] rehab. Prior to recent SNF rehab, pt Ind with dressing and toileting, needed assistance with bathing. At SNF needed assist with bathing, dressing, pericare, Mod I with RW prior to SNF admission, assist to stand and was ambulating short distances with RW. Pt currently requires max - total A with LB ADLs, total A with toileting and pt adamantly declined any standing or SPTs today with OT. Pt would benefit from acute OT services to address impairments to maximize level of function and safety     If plan is discharge home, recommend the following:   A lot of help with walking and/or transfers;A lot of help with bathing/dressing/bathroom;Assistance with cooking/housework;Assist for transportation;Help with stairs or ramp for entrance     Functional Status Assessment   Patient has had a recent decline in their functional status and demonstrates the ability to make significant improvements in function in a reasonable and predictable amount of time.     Equipment Recommendations   Other (comment)     Recommendations for Other Services         Precautions/Restrictions    Precautions Precautions: Fall Recall of Precautions/Restrictions: Intact Restrictions Weight Bearing Restrictions Per Provider Order: No     Mobility Bed Mobility Overal bed mobility: Needs Assistance Bed Mobility: Supine to Sit, Sit to Supine     Supine to sit: Mod assist     General bed mobility comments: Mod A with LE's off EOB and to raise trunk, LEs back onto bed    Transfers                   General transfer comment: Pt adamanly declined standing and stating, "I can't stand". Per PT eval pt max A +2 sit-stand/SPTs      Balance Overall balance assessment: Needs assistance Sitting-balance support: Bilateral upper extremity supported, Feet supported Sitting balance-Leahy Scale: Fair                                     ADL either performed or assessed with clinical judgement   ADL Overall ADL's : Needs assistance/impaired Eating/Feeding: Set up;Independent;Sitting;Bed level   Grooming: Wash/dry hands;Wash/dry face;Contact guard assist;Sitting   Upper Body Bathing: Minimal assistance   Lower Body Bathing: Maximal assistance   Upper Body Dressing : Minimal assistance   Lower Body Dressing: Total assistance       Toileting- Clothing Manipulation and Hygiene: Total assistance;Bed level               Vision Ability to See in Adequate Light: 0 Adequate Patient Visual Report: No change from baseline       Perception  Praxis         Pertinent Vitals/Pain Pain Assessment Pain Assessment: Faces Faces Pain Scale: Hurts little more Pain Location: general body pain Pain Descriptors / Indicators: Aching, Sore Pain Intervention(s): Monitored during session, Limited activity within patient's tolerance, Repositioned     Extremity/Trunk Assessment Upper Extremity Assessment Upper Extremity Assessment: Generalized weakness   Lower Extremity Assessment Lower Extremity Assessment: Defer to PT evaluation   Cervical / Trunk  Assessment Cervical / Trunk Assessment: Other exceptions Cervical / Trunk Exceptions: increased body habitus   Communication Communication Communication: Impaired Factors Affecting Communication: Hearing impaired   Cognition Arousal: Alert   Cognition: No apparent impairments                               Following commands: Intact       Cueing  General Comments   Cueing Techniques: Verbal cues      Exercises     Shoulder Instructions      Home Living Family/patient expects to be discharged to:: Skilled nursing facility Living Arrangements: Other relatives Available Help at Discharge: Available PRN/intermittently Type of Home: House Home Access: Level entry     Home Layout: One level     Bathroom Shower/Tub: Sponge bathes at baseline;Tub/shower unit   Bathroom Toilet: Standard     Home Equipment: Agricultural consultant (2 wheels);Rollator (4 wheels);BSC/3in1      Lives With: Family    Prior Functioning/Environment Prior Level of Function : Needs assist             Mobility Comments: Mod I with RW prior to SNF admission, at SNF pt needed assist to stand with close guard while ambulating short distances ADLs Comments: Prior to recent SNF rehab, pt Ind wiht dressing and toileting, needed assistance with bathing. At SNF needed assist with bathing, dressing, pericare    OT Problem List: Decreased strength;Decreased activity tolerance;Decreased safety awareness;Impaired balance (sitting and/or standing);Decreased knowledge of use of DME or AE   OT Treatment/Interventions: Self-care/ADL training;Therapeutic exercise;Therapeutic activities;Energy conservation;DME and/or AE instruction;Patient/family education;Balance training      OT Goals(Current goals can be found in the care plan section)   Acute Rehab OT Goals Patient Stated Goal: go back to rehab and get stronger OT Goal Formulation: With patient Time For Goal Achievement: 11/04/23 Potential  to Achieve Goals: Fair ADL Goals Pt Will Perform Grooming: with supervision;with set-up;sitting Pt Will Perform Upper Body Bathing: with supervision;with set-up;sitting Pt Will Perform Lower Body Bathing: with mod assist;sitting/lateral leans Pt Will Perform Upper Body Dressing: with supervision;with set-up;sitting Pt Will Transfer to Toilet: with max assist;with mod assist;stand pivot transfer;bedside commode Pt Will Perform Toileting - Clothing Manipulation and hygiene: with max assist;with mod assist;sitting/lateral leans   OT Frequency:  Min 2X/week    Co-evaluation              AM-PAC OT "6 Clicks" Daily Activity     Outcome Measure Help from another person eating meals?: None Help from another person taking care of personal grooming?: A Little Help from another person toileting, which includes using toliet, bedpan, or urinal?: Total Help from another person bathing (including washing, rinsing, drying)?: A Lot Help from another person to put on and taking off regular upper body clothing?: A Little Help from another person to put on and taking off regular lower body clothing?: Total 6 Click Score: 14   End of Session Nurse Communication: Mobility status  Activity Tolerance: Patient  limited by fatigue;No increased pain Patient left: in bed;with call bell/phone within reach;with bed alarm set  OT Visit Diagnosis: Unsteadiness on feet (R26.81);Repeated falls (R29.6);Muscle weakness (generalized) (M62.81)                Time: 4098-1191 OT Time Calculation (min): 19 min Charges:  OT General Charges $OT Visit: 1 Visit OT Evaluation $OT Eval Moderate Complexity: 1 Mod   Alfred Ann 10/21/2023, 3:32 PM

## 2023-10-21 NOTE — Plan of Care (Signed)
  Problem: Education: Goal: Knowledge of disease or condition will improve Outcome: Progressing   Problem: Ischemic Stroke/TIA Tissue Perfusion: Goal: Complications of ischemic stroke/TIA will be minimized Outcome: Progressing   Problem: Coping: Goal: Will verbalize positive feelings about self Outcome: Progressing   Problem: Self-Care: Goal: Ability to participate in self-care as condition permits will improve Outcome: Progressing

## 2023-10-21 NOTE — [place] FL2 (Signed)
 [place]  MEDICAID FL2 LEVEL OF CARE FORM     IDENTIFICATION  Patient Name: Alexis Farmer Birthdate: 08/25/1940 Sex: female Admission Date (Current Location): 10/19/2023  [facility] and [place] Number:  Producer, television/film/video and Address:  [facility], [address], [place], [place] [address]      Provider Number: [id number]  Attending Physician Name and Address:  Kraig Peru, MD  Relative Name and Phone Number:  Laural Polka (Granddaughter) [phone]    Current Level of Care: Hospital Recommended Level of Care: Skilled Nursing Facility Prior Approval Number:    Date Approved/Denied:   PASRR Number: [id number]  Discharge Plan: SNF    Current Diagnoses: Patient Active Problem List   Diagnosis Date Noted   Left-sided weakness 10/19/2023   Shortness of breath 10/02/2023   Morbid obesity (HCC) 10/02/2023   Severe pulmonary hypertension (HCC) 10/01/2023   Congestive heart failure (HCC) 09/29/2023   RSV infection 08/08/2023   Obesity (BMI 30-39.9) 08/04/2023   Diffuse pain 08/03/2023   Abdominal pain 08/03/2023   Abdominal pain, LLQ 08/03/2023   Cellulitis of left lower extremity 04/02/2023   Hyponatremia 04/01/2023   Acute on chronic heart failure with preserved ejection fraction (HFpEF) (HCC) 04/01/2023   Cellulitis 04/01/2023   Lower extremity weakness 04/01/2023   Inability to access health care due to transportation insecurity 03/31/2023   Mood disorder (HCC) 01/20/2023   Vision loss, bilateral 10/22/2022   Pre-syncope 10/22/2022   Ceruminosis, right 09/07/2022   Type 2 diabetes mellitus without complications (HCC) 08/14/2022   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/2022   Hallucinations, visual 07/20/2022   Need for pneumococcal vaccination 07/20/2022   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   Chronic renal failure (CRF), stage 3b (HCC) 03/21/2022   Ileus (HCC) 08/22/2021   Lymphedema  04/19/2021   Atherosclerosis of aorta (HCC) 01/16/2021   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/16/2021   Spinal stenosis of lumbar region 01/16/2021   Panic attack 01/15/2021   Hypertensive urgency 12/15/2020   Other chronic pain 08/25/2020   Hypertension associated with diabetes (HCC) 08/25/2020   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) 05/30/2020   Retinopathy 05/30/2020   Multinodular goiter 02/04/2020   Proteinuria 12/22/2019   Physical deconditioning 08/13/2019   Abdominal aortic atherosclerosis (HCC) 07/24/2019   Obesity, Class III, BMI 40-49.9 (morbid obesity) 06/09/2019   Hypercalcemia 03/18/2019   Acute renal failure superimposed on stage 3b chronic kidney disease (HCC) 02/06/2019   Round hole, unspecified eye 02/06/2019   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 08/13/2018   Abnormal gait 03/26/2018   Arthritis 03/26/2018   Thyroid  nodule 01/20/2018   Cerumen impaction 01/21/2017   UTI (urinary tract infection) 09/24/2016   Rectal hemorrhage 07/29/2016   Carotid artery stenosis 07/19/2016   PAD (peripheral artery disease) (HCC) 07/19/2016   Chronic diastolic CHF (congestive heart failure) (HCC) 05/31/2016   Chronic fatigue 11/15/2015   Depression, recurrent (HCC) 10/13/2015   Osteoarthritis 10/13/2015   Mixed hyperlipidemia 08/26/2014   Uncontrolled type 2 diabetes mellitus with hyperglycemia, with long-term current use of insulin  (HCC) 04/08/2014   Diabetic polyneuropathy (HCC) 04/08/2014   Type 2 diabetes mellitus with other diabetic kidney complication (HCC) 04/08/2014    Orientation RESPIRATION BLADDER Height & Weight     Self, Time, Situation, Place  Normal Continent Weight: 221 lb 5.5 oz (100.4 kg) Height:  BEHAVIORAL SYMPTOMS/MOOD NEUROLOGICAL BOWEL NUTRITION STATUS      Continent Diet  (Please see discharge summary)  AMBULATORY STATUS COMMUNICATION OF NEEDS Skin   Extensive Assist Verbally Other (Comment) (Wound/Incision LDAS,Negative pressure wound therapy,heel,R)                       Personal Care Assistance Level of Assistance  Feeding Bathing Assistance: Maximum assistance Feeding assistance: Independent Dressing Assistance: Maximum assistance     Functional Limitations Info  Sight, Hearing, Speech Sight Info: Impaired Financial trader) Hearing Info: Adequate Speech Info: Adequate    SPECIAL CARE FACTORS FREQUENCY  PT (By licensed PT), OT (By licensed OT)     PT Frequency: 5x min weekly OT Frequency: 5x min weekly            Contractures Contractures Info: Not present    Additional Factors Info  Code Status, Allergies, Insulin  Sliding Scale Code Status Info: FULL Allergies Info: Celexa  (citalopram ),Jardiance  (empagliflozin ),Norvasc  (amlodipine ),Tape,Penicillin V,Penicillin V Potassium,Penicillins   Insulin  Sliding Scale Info: insulin  aspart (novoLOG ) injection 0-15 Units 3 times daily with meals,insulin  aspart (novoLOG ) injection 0-5 Units daily at bedtime,insulin  glargine-yfgn (SEMGLEE ) injection 15 Units daily at bedtime       Current Medications (10/21/2023):  This is the current hospital active medication list Current Facility-Administered Medications  Medication Dose Route Frequency Provider Last Rate Last Admin   acetaminophen  (TYLENOL ) tablet 650 mg  650 mg Oral Q4H PRN Patel, Pranav M, MD   650 mg at 10/21/23 9147   aspirin  EC tablet 81 mg  81 mg Oral Daily Patel, Pranav M, MD   81 mg at 10/21/23 1000   benzonatate  (TESSALON ) capsule 200 mg  200 mg Oral TID PRN Bary Boss, DO       carvedilol  (COREG ) tablet 6.25 mg  6.25 mg Oral BID WC Patel, Pranav M, MD       clopidogrel (PLAVIX) tablet 75 mg  75 mg Oral Daily Jonette Nestle A, NP   75 mg at 10/21/23 0959   heparin  injection 5,000 Units  5,000 Units Subcutaneous Q8H Brunilda Capra V, MD    5,000 Units at 10/21/23 8295   hydrALAZINE  (APRESOLINE ) injection 10 mg  10 mg Intravenous Q6H PRN Patel, Pranav M, MD       hydrALAZINE  (APRESOLINE ) tablet 100 mg  100 mg Oral TID Patel, Pranav M, MD   100 mg at 10/21/23 1000   insulin  aspart (novoLOG ) injection 0-15 Units  0-15 Units Subcutaneous TID WC Patel, Pranav M, MD   11 Units at 10/21/23 1341   insulin  aspart (novoLOG ) injection 0-5 Units  0-5 Units Subcutaneous QHS Patel, Pranav M, MD       insulin  glargine-yfgn (SEMGLEE ) injection 15 Units  15 Units Subcutaneous QHS Patel, Pranav M, MD   10 Units at 10/20/23 2123   LORazepam  (ATIVAN ) tablet 0.5 mg  0.5 mg Oral BID PRN Patel, Pranav M, MD       polyethylene glycol (MIRALAX  / GLYCOLAX ) packet 17 g  17 g Oral Daily Patel, Pranav M, MD       rosuvastatin  (CRESTOR ) tablet 20 mg  20 mg Oral Daily Patel, Ekta V, MD   20 mg at 10/21/23 0959   torsemide  (DEMADEX ) tablet 20 mg  20 mg Oral Daily Patel, Pranav M, MD   20 mg at 10/21/23 6213   traZODone  (DESYREL ) tablet 50 mg  50 mg Oral QHS PRN Patel, Pranav M, MD   50 mg at 10/20/23 2200  Discharge Medications: Please see discharge summary for a list of discharge medications.  Relevant Imaging Results:  Relevant Lab Results:   Additional Information [id number]  Carmon Christen, LCSWA

## 2023-10-21 NOTE — Progress Notes (Signed)
 STROKE TEAM PROGRESS NOTE   INTERIM HISTORY/SUBJECTIVE She is lying comfortably in bed.  No new symptoms.  Neurological exam is stable and unchanged.  Recommend check carotid US  and if moderate stenosis is confirmed may need referral to vascular surgery..  PT recommending home health.   OBJECTIVE  CBC    Component Value Date/Time   WBC 8.6 10/21/2023 0651   RBC 4.15 10/21/2023 0651   HGB 11.9 (L) 10/21/2023 0651   HGB 11.6 (L) 07/15/2023 1257   HCT 37.3 10/21/2023 0651   PLT 546 (H) 10/21/2023 0651   PLT 387 07/15/2023 1257   MCV 89.9 10/21/2023 0651   MCH 28.7 10/21/2023 0651   MCHC 31.9 10/21/2023 0651   RDW 14.6 10/21/2023 0651   LYMPHSABS 4.2 (H) 10/19/2023 1109   MONOABS 0.8 10/19/2023 1109   EOSABS 0.2 10/19/2023 1109   BASOSABS 0.1 10/19/2023 1109    BMET    Component Value Date/Time   NA 133 (L) 10/21/2023 0651   NA 138 02/19/2019 0000   K 4.5 10/21/2023 0651   CL 98 10/21/2023 0651   CO2 25 10/21/2023 0651   GLUCOSE 252 (H) 10/21/2023 0651   BUN 32 (H) 10/21/2023 0651   BUN 11 02/19/2019 0000   CREATININE 1.79 (H) 10/21/2023 0651   CREATININE 1.42 (H) 07/15/2023 1257   CREATININE 1.52 (H) 02/06/2019 1447   CALCIUM  9.1 10/21/2023 0651   EGFR 32.0 07/25/2023 0801   GFRNONAA 28 (L) 10/21/2023 0651   GFRNONAA 36 (L) 07/15/2023 1257    IMAGING past 24 hours No results found.   Vitals:   10/21/23 0804 10/21/23 0842 10/21/23 1000 10/21/23 1224  BP: (!) 185/78 (!) 182/77 (!) 182/77 111/88  Pulse:      Resp:  19    Temp:  98.1 F (36.7 C)    TempSrc:  Oral    SpO2:      Weight:         PHYSICAL EXAM General:  Alert, well-nourished, well-developed patient in no acute distress Psych:  Mood and affect appropriate for situation CV: Regular rate and rhythm on monitor Respiratory:  Regular, unlabored respirations on room air GI: Abdomen soft and nontender   NEURO:  Mental Status: AA&Ox3, patient is able to give clear and coherent  history Speech/Language: speech is without dysarthria or aphasia.  Naming, repetition, fluency, and comprehension intact.  Cranial Nerves:  II: PERRL. Visual fields full.  III, IV, VI: EOMI. Eyelids elevate symmetrically.  V: Sensation is intact to light touch and symmetrical to face.  VII: Face is symmetrical resting and smiling VIII: hearing intact to voice. IX, X: Palate elevates symmetrically. Phonation is normal.  HQ:IONGEXBM shrug 5/5. XII: tongue is midline without fasciculations. Motor:  Left hand grasp weak Left lower extremity - endorses heavy feeling, 4/5 RUE sand RLE full strength  Tone: is normal and bulk is normal Sensation- Intact to light touch bilaterally. Extinction absent to light touch to DSS.   Coordination: Left fine finger movements Gait- deferred  Most Recent NIH 3   ASSESSMENT/PLAN  Alexis Farmer is a 83 y.o. female with history ofmonoclonal B-cell lymphoma, stage IIIb CKD, DM, HTN, HFpEF, chronic pain, obesity, presenting with numbness and weakness in left lower extremity difficulty walking.  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 Reconstitution intradurally. 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 Carotid Doppler  Pending  2D Echo EF 60-65% LDL 62 HgbA1c 8.7 VTE prophylaxis - Heparin  No antithrombotic (not taking ASA consistently) prior to admission, now on aspirin  81 mg daily and clopidogrel 75 mg daily for 3 weeks and then ASA 81mg  alone. Therapy recommendations:  Home Health PT and Home  Health OT Disposition:  Pending  HFpEF Hypertension Home meds:  Coreg ,hydralazine , spironolactone , Demadex   Stable- resume home meds Blood Pressure Goal: BP less than 220/110   Hyperlipidemia Home meds:  Crestor , resumed in hospital LDL 62, goal < 70 Continue statin at discharge  Diabetes type II Uncontrolled Home meds:  Insulin  HgbA1c 8.7, goal < 7.0 CBGs, SSI Recommend close follow-up with PCP for better DM control  Dysphagia Patient has post-stroke dysphagia, SLP consulted    Diet   Diet heart healthy/carb modified Fluid consistency: Thin   Advance diet as tolerated  Other Stroke Risk Factors Obesity, 39.99, BMI >/= 30 associated with increased stroke risk, recommend weight loss, diet and exercise as appropriate   Other Active Problems CKD IIIb Cr 2.57, GFR 18 Hypothyroidism TSH and T4 [facility]  Hospital day # 2   Patient presents in clinic with leg weakness and gait difficulties due to embolic right MCA branch infarcts possibly from embolization from moderate proximal right ICA stenosis.  Recommend check carotid ultrasound to confirm this and consider referral to vascular surgery.  Dual antiplatelet therapy aspirin  and Plavix for 3 weeks followed by aspirin  alone and aggressive risk factor modification.  Long discussion with patient and Dr. Lydia Sams and answered questions.  Greater than 50% time during this 35 minute visit with counseling coordination of care discussion with patient and care team and answering questions.  Ardella Beaver, MD Medical Director [facility] Stroke [facility] Pager: [phone] 10/21/2023 2:32 PM   To contact Stroke Continuity provider, please refer to [email or web address]. After hours, contact General Neurology

## 2023-10-22 ENCOUNTER — Inpatient Hospital Stay (HOSPITAL_COMMUNITY)

## 2023-10-22 DIAGNOSIS — I130 Hypertensive heart and chronic kidney disease with heart failure and stage 1 through stage 4 chronic kidney disease, or unspecified chronic kidney disease: Secondary | ICD-10-CM | POA: Diagnosis not present

## 2023-10-22 DIAGNOSIS — R29703 NIHSS score 3: Secondary | ICD-10-CM | POA: Diagnosis not present

## 2023-10-22 DIAGNOSIS — I63411 Cerebral infarction due to embolism of right middle cerebral artery: Secondary | ICD-10-CM | POA: Diagnosis not present

## 2023-10-22 DIAGNOSIS — I69391 Dysphagia following cerebral infarction: Secondary | ICD-10-CM | POA: Diagnosis not present

## 2023-10-22 DIAGNOSIS — R531 Weakness: Secondary | ICD-10-CM | POA: Diagnosis not present

## 2023-10-22 LAB — RENAL FUNCTION PANEL
Albumin: 2.5 g/dL — ABNORMAL LOW (ref 3.5–5.0)
Anion gap: 9 (ref 5–15)
BUN: 28 mg/dL — ABNORMAL HIGH (ref 8–23)
CO2: 27 mmol/L (ref 22–32)
Calcium: 8.7 mg/dL — ABNORMAL LOW (ref 8.9–10.3)
Chloride: 99 mmol/L (ref 98–111)
Creatinine, Ser: 1.62 mg/dL — ABNORMAL HIGH (ref 0.44–1.00)
GFR, Estimated: 31 mL/min — ABNORMAL LOW (ref >60)
Glucose, Bld: 149 mg/dL — ABNORMAL HIGH (ref 70–99)
Phosphorus: 3.2 mg/dL (ref 2.5–4.6)
Potassium: 3.9 mmol/L (ref 3.5–5.1)
Sodium: 135 mmol/L (ref 135–145)

## 2023-10-22 LAB — CBC
HCT: 33.1 % — ABNORMAL LOW (ref 36.0–46.0)
Hemoglobin: 10.9 g/dL — ABNORMAL LOW (ref 12.0–15.0)
MCH: 28.8 pg (ref 26.0–34.0)
MCHC: 32.9 g/dL (ref 30.0–36.0)
MCV: 87.6 fL (ref 80.0–100.0)
Platelets: 512 10*3/uL — ABNORMAL HIGH (ref 150–400)
RBC: 3.78 MIL/uL — ABNORMAL LOW (ref 3.87–5.11)
RDW: 14.5 % (ref 11.5–15.5)
WBC: 9.4 10*3/uL (ref 4.0–10.5)
nRBC: 0 % (ref 0.0–0.2)

## 2023-10-22 LAB — GLUCOSE, CAPILLARY
Glucose-Capillary: 168 mg/dL — ABNORMAL HIGH (ref 70–99)
Glucose-Capillary: 228 mg/dL — ABNORMAL HIGH (ref 70–99)
Glucose-Capillary: 214 mg/dL — ABNORMAL HIGH (ref 70–99)
Glucose-Capillary: 218 mg/dL — ABNORMAL HIGH (ref 70–99)

## 2023-10-22 LAB — MAGNESIUM: Magnesium: 1.6 mg/dL — ABNORMAL LOW (ref 1.7–2.4)

## 2023-10-22 MED ORDER — GABAPENTIN 300 MG PO CAPS
300.0000 mg | ORAL_CAPSULE | Freq: Once | ORAL | Status: AC
  Administered 2023-10-22: 300 mg via ORAL
  Filled 2023-10-22: qty 1

## 2023-10-22 MED ORDER — OXYCODONE HCL 5 MG PO TABS
5.0000 mg | ORAL_TABLET | Freq: Three times a day (TID) | ORAL | Status: DC | PRN
  Administered 2023-10-22 – 2023-10-24 (×4): 5 mg via ORAL
  Filled 2023-10-22 (×4): qty 1

## 2023-10-22 MED ORDER — OXYCODONE HCL 5 MG PO TABS: 2.5000 mg | ORAL_TABLET | Freq: Three times a day (TID) | ORAL | Status: DC | PRN

## 2023-10-22 MED ORDER — MAGNESIUM SULFATE 2 GM/50ML IV SOLN
2.0000 g | Freq: Once | INTRAVENOUS | Status: AC
  Administered 2023-10-22: 2 g via INTRAVENOUS
  Filled 2023-10-22: qty 50

## 2023-10-22 MED ORDER — GABAPENTIN 100 MG PO CAPS
100.0000 mg | ORAL_CAPSULE | Freq: Every day | ORAL | Status: DC
Start: 2023-10-22 — End: 2023-10-23
  Administered 2023-10-22: 100 mg via ORAL
  Filled 2023-10-22: qty 1

## 2023-10-22 MED ORDER — CARVEDILOL 3.125 MG PO TABS
3.1250 mg | ORAL_TABLET | Freq: Two times a day (BID) | ORAL | Status: DC
  Administered 2023-10-22 – 2023-10-24 (×5): 3.125 mg via ORAL
  Filled 2023-10-22 (×5): qty 1

## 2023-10-22 MED ORDER — TORSEMIDE 20 MG PO TABS
20.0000 mg | ORAL_TABLET | Freq: Every day | ORAL | Status: DC
Start: 2023-10-23 — End: 2023-10-24
  Administered 2023-10-23 – 2023-10-24 (×2): 20 mg via ORAL
  Filled 2023-10-22 (×2): qty 1

## 2023-10-22 NOTE — TOC Progression Note (Signed)
 Transition of Care [facility]) - Progression Note    Patient Details  Name: Alexis Farmer MRN: [id number] Date of Birth: [birth year]  Transition of Care [facility]) CM/SW Contact  Carmon Christen, LCSWA Phone Number: 10/22/2023, 11:17 AM  Clinical Narrative:     Patient has SNF bed at [facility] place when medically stable for dc. CSW will continue to follow.  Expected Discharge Plan: Skilled Nursing Facility Barriers to Discharge: Continued Medical Work up  Expected Discharge Plan and Services In-house Referral: Clinical Social Work     Living arrangements for the past 2 months:  (came from [place] short term,lives at home with Granddaughter)                                       Social Determinants of Health (SDOH) Interventions SDOH Screenings   Food Insecurity: No Food Insecurity (10/21/2023)  Recent Concern: Food Insecurity - Food Insecurity Present (10/03/2023)  Housing: Low Risk  (10/21/2023)  Transportation Needs: No Transportation Needs (10/21/2023)  Recent Concern: Transportation Needs - Unmet Transportation Needs (09/23/2023)  Utilities: Not At Risk (10/21/2023)  Alcohol Screen: Low Risk  (11/29/2022)  Depression (PHQ2-9): High Risk (09/23/2023)  Financial Resource Strain: Medium Risk (10/03/2023)  Physical Activity: Inactive (10/03/2023)  Social Connections: Moderately Integrated (10/21/2023)  Stress: Stress Concern Present (11/29/2022)  Tobacco Use: Low Risk  (10/21/2023)    Readmission Risk Interventions    10/03/2023   12:03 PM  Readmission Risk Prevention Plan  Transportation Screening Complete  PCP or Specialist Appt within 3-5 Days Complete  HRI or Home Care Consult Complete  Social Work Consult for Recovery Care Planning/Counseling Complete  Palliative Care Screening Not Applicable  Medication Review Ocean[employer]) Complete

## 2023-10-22 NOTE — Progress Notes (Signed)
 STROKE TEAM PROGRESS NOTE   INTERIM HISTORY/SUBJECTIVE She is lying comfortably in bed.  No new symptoms.  Neurological exam is stable and unchanged.   Carotid ultrasound shows only 1-39% bilateral carotid stenosis.  Vital signs stable.   OBJECTIVE  CBC    Component Value Date/Time   WBC 9.4 10/22/2023 0440   RBC 3.78 (L) 10/22/2023 0440   HGB 10.9 (L) 10/22/2023 0440   HGB 11.6 (L) 07/15/2023 1257   HCT 33.1 (L) 10/22/2023 0440   PLT 512 (H) 10/22/2023 0440   PLT 387 07/15/2023 1257   MCV 87.6 10/22/2023 0440   MCH 28.8 10/22/2023 0440   MCHC 32.9 10/22/2023 0440   RDW 14.5 10/22/2023 0440   LYMPHSABS 4.2 (H) 10/19/2023 1109   MONOABS 0.8 10/19/2023 1109   EOSABS 0.2 10/19/2023 1109   BASOSABS 0.1 10/19/2023 1109    BMET    Component Value Date/Time   NA 135 10/22/2023 0440   NA 138 02/19/2019 0000   K 3.9 10/22/2023 0440   CL 99 10/22/2023 0440   CO2 27 10/22/2023 0440   GLUCOSE 149 (H) 10/22/2023 0440   BUN 28 (H) 10/22/2023 0440   BUN 11 02/19/2019 0000   CREATININE 1.62 (H) 10/22/2023 0440   CREATININE 1.42 (H) 07/15/2023 1257   CREATININE 1.52 (H) 02/06/2019 1447   CALCIUM  8.7 (L) 10/22/2023 0440   EGFR 32.0 07/25/2023 0801   GFRNONAA 31 (L) 10/22/2023 0440   GFRNONAA 36 (L) 07/15/2023 1257    IMAGING past 24 hours VAS US  CAROTID Result Date: 10/22/2023 Carotid Arterial Duplex Study Patient Name:  KERIANN GALLOGLY  Date of Exam:   10/21/2023 Medical Rec #: [id number]         Accession #:    [id number] Date of Birth: 09/18/1940         Patient Gender: F Patient Age:   83 years Exam Location:  [facility] Procedure:      VAS US  CAROTID Referring Phys: Jonette Nestle --------------------------------------------------------------------------------  Indications:       CVA. Risk Factors:      Hypertension, hyperlipidemia, Diabetes, prior CVA. Comparison Study:  Increased stenosis length in RT ICA, and novel retrograde                    flow in RT VertA seen since  previous exam 02/12/23. Performing Technologist: Estanislao Heimlich  Examination Guidelines: A complete evaluation includes B-mode imaging, spectral Doppler, color Doppler, and power Doppler as needed of all accessible portions of each vessel. Bilateral testing is considered an integral part of a complete examination. Limited examinations for reoccurring indications may be performed as noted.  Right Carotid Findings: +----------+--------+--------+--------+--------------------+------------------+           PSV cm/sEDV cm/sStenosisPlaque Description  Comments           +----------+--------+--------+--------+--------------------+------------------+ CCA Prox  50      7                                   intimal thickening +----------+--------+--------+--------+--------------------+------------------+ CCA Distal71      0                                   intimal thickening +----------+--------+--------+--------+--------------------+------------------+ ICA Prox  218     0       1-39%   diffuse and calcific                   +----------+--------+--------+--------+--------------------+------------------+  ICA Mid   140     14      1-39%   calcific                               +----------+--------+--------+--------+--------------------+------------------+ ICA Distal75      9                                                      +----------+--------+--------+--------+--------------------+------------------+ ECA       210     0                                                      +----------+--------+--------+--------+--------------------+------------------+ +----------+--------+-------+--------+-------------------+           PSV cm/sEDV cmsDescribeArm Pressure (mmHG) +----------+--------+-------+--------+-------------------+ Subclavian160                                        +----------+--------+-------+--------+-------------------+  +---------+--------+--+--------+-+----------+ VertebralPSV cm/s81EDV cm/s0Retrograde +---------+--------+--+--------+-+----------+  Left Carotid Findings: +----------+--------+--------+--------+------------------+------------------+           PSV cm/sEDV cm/sStenosisPlaque DescriptionComments           +----------+--------+--------+--------+------------------+------------------+ CCA Prox  63      6                                 intimal thickening +----------+--------+--------+--------+------------------+------------------+ CCA Distal101     7                                 intimal thickening +----------+--------+--------+--------+------------------+------------------+ ICA Prox  63      6       1-39%   calcific and focal                   +----------+--------+--------+--------+------------------+------------------+ ICA Distal81      11                                                   +----------+--------+--------+--------+------------------+------------------+ ECA       187     0                                                    +----------+--------+--------+--------+------------------+------------------+ +----------+--------+--------+--------+-------------------+           PSV cm/sEDV cm/sDescribeArm Pressure (mmHG) +----------+--------+--------+--------+-------------------+ Subclavian226     0                                   +----------+--------+--------+--------+-------------------+ +---------+--------+---+--------+-+ VertebralPSV cm/s100EDV cm/s8 +---------+--------+---+--------+-+   Summary: Right Carotid: Velocities in the right ICA are consistent  with a 1-39% stenosis. Left Carotid: Velocities in the left ICA are consistent with a 1-39% stenosis. Vertebrals:  Left vertebral artery demonstrates antegrade flow. Right vertebral              artery demonstrates retrograde flow. Subclavians: Left subclavian artery was stenotic. Normal flow  hemodynamics were              seen in bilateral subclavian arteries. *See table(s) above for measurements and observations.  Electronically signed by Ardella Beaver MD on 10/22/2023 at 10:33:01 AM.    Final    DG Abd 1 View Result Date: 10/21/2023 CLINICAL DATA:  Abdominal pain. EXAM: ABDOMEN - 1 VIEW COMPARISON:  Radiographs 08/03/2023.  Abdominopelvic CT 10/22/2022. FINDINGS: Two supine views of the abdomen are submitted. There is a normal nonobstructive bowel gas pattern. No supine evidence of pneumoperitoneum or suspicious abdominal calcification. Mildly prominent stool within the left colon. Multilevel spondylosis with mild degenerative changes of both hips. No acute osseous findings are seen. IMPRESSION: No evidence of bowel obstruction or other acute abdominal process. Mildly prominent stool within the left colon. Electronically Signed   By: Elmon Hagedorn M.D.   On: 10/21/2023 15:03     Vitals:   10/21/23 2307 10/22/23 0440 10/22/23 0727 10/22/23 1013  BP: 122/73 (!) 163/52 100/65 127/70  Pulse: 77 75 72   Resp: 16 17 20    Temp: 98.8 F (37.1 C) 98 F (36.7 C) 98.8 F (37.1 C)   TempSrc: Oral Oral Axillary   SpO2: 100% 98% 95%   Weight:      Height:         PHYSICAL EXAM General:  Alert, well-nourished, well-developed patient in no acute distress Psych:  Mood and affect appropriate for situation CV: Regular rate and rhythm on monitor Respiratory:  Regular, unlabored respirations on room air GI: Abdomen soft and nontender   NEURO:  Mental Status: AA&Ox3, patient is able to give clear and coherent history Speech/Language: speech is without dysarthria or aphasia.  Naming, repetition, fluency, and comprehension intact.  Cranial Nerves:  II: PERRL. Visual fields full.  III, IV, VI: EOMI. Eyelids elevate symmetrically.  V: Sensation is intact to light touch and symmetrical to face.  VII: Face is symmetrical resting and smiling VIII: hearing intact to voice. IX, X: Palate  elevates symmetrically. Phonation is normal.  ON:GEXBMWUX shrug 5/5. XII: tongue is midline without fasciculations. Motor:  Left hand grasp weak Left lower extremity - endorses heavy feeling, 4/5 RUE sand RLE full strength  Tone: is normal and bulk is normal Sensation- Intact to light touch bilaterally. Extinction absent to light touch to DSS.   Coordination: Left fine finger movements Gait- deferred  Most Recent NIH 3   ASSESSMENT/PLAN  Ms. Martella Medine is a 83 y.o. female with history ofmonoclonal B-cell lymphoma, stage IIIb CKD, DM, HTN, HFpEF, chronic pain, obesity, presenting with numbness and weakness in left lower extremity difficulty walking.  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 Reconstitution intradurally. 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 Carotid Doppler   bilateral 1-39% stenosis 2D Echo  EF 60-65% LDL 62 HgbA1c 8.7 VTE prophylaxis - Heparin  No antithrombotic (not taking ASA consistently) prior to admission, now on aspirin  81 mg daily and clopidogrel 75 mg daily for 3 weeks and then ASA 81mg  alone. Therapy recommendations:  Home Health PT and Home Health OT Disposition:  Pending  HFpEF Hypertension Home meds:  Coreg ,hydralazine , spironolactone , Demadex   Stable- resume home meds Blood Pressure Goal: BP less than 220/110   Hyperlipidemia Home meds:  Crestor , resumed in hospital LDL 62, goal < 70 Continue statin at discharge  Diabetes type II Uncontrolled Home meds:  Insulin  HgbA1c 8.7, goal <  7.0 CBGs, SSI Recommend close follow-up with PCP for better DM control  Dysphagia Patient has post-stroke dysphagia, SLP consulted    Diet   Diet heart healthy/carb modified Fluid consistency: Thin   Advance diet as tolerated  Other Stroke Risk Factors Obesity, 39.99, BMI >/= 30 associated with increased stroke risk, recommend weight loss, diet and exercise as appropriate   Other Active Problems CKD IIIb Cr 2.57, GFR 18 Hypothyroidism TSH and T4 [facility]  Hospital day # 3   Patient presents in clinic with leg weakness and gait difficulties due to embolic right MCA branch infarcts possibly from embolization from moderate proximal right ICA stenosis.  Recommend check carotid ultrasound to confirm this and consider referral to vascular surgery.  Dual antiplatelet therapy aspirin  and Plavix for 3 weeks followed by aspirin  alone and aggressive risk factor modification.  Long discussion with patient and Dr. Lydia Sams and answered questions.  Greater than 50% time during this 35 minute visit with counseling coordination of care discussion with patient and care team and answering questions.  Stroke team will sign off.  Kindly call for questions.  Follow-up as an outpatient stroke clinic in 2 months  Ardella Beaver, MD Medical Director [facility] Pager: [phone] 10/22/2023 11:02 AM   To contact Stroke Continuity provider, please refer to [email or web address]. After hours, contact General Neurology

## 2023-10-22 NOTE — Care Management Important Message (Signed)
 Important Message  Patient Details  Name: Alexis Farmer MRN: [id number] Date of Birth: 09-12-1940   Important Message Given:  Yes - Medicare IM     Janith Melnick 10/22/2023, 10:35 AM

## 2023-10-22 NOTE — Plan of Care (Signed)
  Problem: Ischemic Stroke/TIA Tissue Perfusion: Goal: Complications of ischemic stroke/TIA will be minimized Outcome: Progressing   Problem: Coping: Goal: Will verbalize positive feelings about self Outcome: Progressing   Problem: Self-Care: Goal: Ability to participate in self-care as condition permits will improve Outcome: Progressing

## 2023-10-22 NOTE — Inpatient Diabetes Management (Signed)
 Inpatient Diabetes Program Recommendations  AACE/ADA: New Consensus Statement on Inpatient Glycemic Control (2015)  Target Ranges:  Prepandial:   less than 140 mg/dL      Peak postprandial:   less than 180 mg/dL (1-2 hours)      Critically ill patients:  140 - 180 mg/dL   Lab Results  Component Value Date   GLUCAP 168 (H) 10/22/2023   HGBA1C 8.7 (H) 10/19/2023    Review of Glycemic Control  Latest Reference Range & Units 10/21/23 08:44 10/21/23 12:28 10/21/23 16:53 10/21/23 19:49 10/22/23 07:30  Glucose-Capillary 70 - 99 mg/dL 130 (H) 865 (H) 784 (H) 188 (H) 168 (H)   Diabetes history: DM 2 Outpatient Diabetes medications: NPH 20 units qam, 15 units qpm, Regular insulin  0-8 units tid Current orders for Inpatient glycemic control:  Semglee  15 units qhs Novolog  0-15 units tid + hs  A1c 8.7% on 5/3  Inpatient Diabetes Program Recommendations:    -   Consider adding Novolog  4 units tid meal coverage if eating >50% of meals  Thanks, Eloise Hake RN, MSN, BC-ADM Inpatient Diabetes Coordinator Team Pager [phone] (8a-5p)

## 2023-10-22 NOTE — Progress Notes (Signed)
 [facility] Progress Note Patient: Alexis Farmer ZOX:[id number] DOB: 03/14/1941 DOA: 10/19/2023  DOS: the patient was seen and examined on 10/22/2023  Brief Hospital Course: Patient with PMH of CKD 3B, type II DM, HTN, HFpEF, obesity, B-cell lymphoma, chronic anxiety, HLD presented to the hospital with complaints of numbness and weakness on left leg. Found to have acute stroke in the right frontal lobe in the left parietal lobe. Neurology was consulted. Currently awaiting Doppler ultrasound to rule out DVT.  Assessment and Plan: Acute ischemic infarct. Multifocal, right medial frontal lobe, right frontal operculum, medial frontal gyrus. Concern for embolic infarct versus large vessel stenosis. MRI as above. 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 Neurology was consulted. Hemoglobin A1c 8.7.  LDL 62.  Echocardiogram shows preserved EF of 60 to 65%. Patient not on any antithrombotic before admission.  Currently recommendation is for 81 mg aspirin  and Plavix 75 mg daily for 3 weeks followed by 81 mg aspirin  alone. Neurology recommended carotid Doppler.  Appears to be unchanged from prior and no significant stenosis. Discussed with neurology.  No further workup recommended. PT OT recommending SNF.  Anxiety. Patient is taking Ativan  0.5 mg twice daily as needed at home. Family is concerned with regards to excessive sedation with regards to psychotropic medications. Patient currently wants to continue Ativan  0.5 mg twice daily as needed in the hospital. Family is aware of patient's decision and currently agrees with it.  Acute left ankle pain. Patient reports that she has severe pain in her ankle last night.  Received some gabapentin  and since then the pain actually resolved this morning. Later in the day patient calls her family reporting severe pain. On my evaluation patient has significant tenderness but patient reports  that her pain is tolerable right now.  Family and patient would like to add oxycodone  for pain control which is added as needed. Suspect gout.  X-ray recommended.  Lower extremity Doppler to rule out DVT.  HFpEF. HTN. Home medication includes Coreg , hydralazine , Aldactone  and torsemide . Volume status wise patient appears to be euvolemic for now. Blood pressure was elevated as her home medications were adjusted on the presentation of stroke to allow for permissive hypertension. Discussed with neurology, current blood pressure goal is less than 220/110 but can resume her home medication. Will resume home dose of all the medications.  HLD. On Crestor .  Currently resumed.  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 Hemoglobin A1c 8.7. Currently on sliding scale insulin . Home regimen includes NPH 20 in the morning 15 in the evening. Patient was recently hospitalized and was on 20 units of Semglee . For now we will resume at 15 units nightly. For neuropathy patient uses gabapentin  as needed at home although prescribed as on scheduled basis. Given that the patient's uses only as needed and family has concerns with the cost of psychotropic medication I will currently hold this medication.  AKI on CKD 3B. Baseline serum creatinine appears to be around 1.5. Currently serum creatinine is 2.57. Recently hospitalized and was discharged with serum creatinine 2.2. Likely in the setting of poor p.o. intake. Potential for overdiuresis in the setting of euvolemic status right now. Resume torsemide  and Aldactone .  Thrombocytosis. Appears to be chronic.  For now we will monitor.  Obesity Class 3 Body mass index is 39.21 kg/m.  Placing the pt at higher risk of poor outcomes.  Hypothyroidism. TSH and free T4 within normal  limit. Will continue to monitor for now.  Subjective: Reported severe pain last night which helped with gabapentin .   No nausea no vomiting.  Reported 0.  This morning at the time my evaluation but did have some mild tenderness. Later in the day reports that she has pain as severe as she cannot give any number but few minutes later reports that pain has eased off and currently only has mild soreness.  Physical Exam: In mild distress.  Appears anxious. S1-S2 present. Clear to auscultation. Bowel sound present. Left lower extremity has chronic edema which appears to be unchanged. There is some warmth around her ankle area with minor redness. Alert, oriented.  Left-sided weakness.  Data Reviewed: I have Reviewed nursing notes, Vitals, and Lab results. Order ultrasound Doppler.  Disposition: Status is: Inpatient Remains inpatient appropriate because: Monitor for further workup and neurology  heparin  injection 5,000 Units Start: 10/19/23 2200   Family Communication: Discussed with daughter  Level of care: Telemetry Cardiac   Vitals:   10/22/23 1013 10/22/23 1218 10/22/23 1630 10/22/23 1924  BP: 127/70 108/79 121/66 105/62  Pulse:  66 68 75  Resp:   (!) 21 20  Temp:  97.7 F (36.5 C) 97.7 F (36.5 C) 98.9 F (37.2 C)  TempSrc:  Oral Oral Oral  SpO2:  96%  97%  Weight:      Height:         Author: Charlean Congress, MD 10/22/2023 8:15 PM  Please look on [email or web address] to find out who is on call.

## 2023-10-22 NOTE — Progress Notes (Signed)
 Physical Therapy Treatment Patient Details Name: Alexis Farmer MRN: [id number] DOB: 02-26-1941 Today's Date: 10/22/2023   History of Present Illness 83 y.o. female presents to [facility]/19/[facility] from SNF with with numbness and weakness in LLE and abnormal gait. Progressed to B LE numbness and R hand numbness. 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 Recent d/c for CHF exacerbation. PMHx: monoclonal B-cell lymphoma, stage IIIb CKD, DM, HTN, HFpEF, chronic pain, obesity, vertigo    PT Comments  Patient sleeping and difficult to arouse on arrival. RN present and reports pt given ativan  at 0130 this morning due to inability to sleep. Patient gradually awakening and participated in LE exercises to decr her stiffness. Explaining to patient that we had 2 people present to help her sit up on side of bed and possibly transfer to [facility]. She refused. She noticed her lunch tray present and stated all she wanted to do right now was eat her lunch. Patient positioned up in bed and lunch tray set up for her. Patient self-feeding on departure. Will continue efforts.     If plan is discharge home, recommend the following: Assistance with cooking/housework;Direct supervision/assist for medications management;Assist for transportation;Two people to help with walking and/or transfers;Direct supervision/assist for financial management;Help with stairs or ramp for entrance;Supervision due to cognitive status   Can travel by private vehicle     No  Equipment Recommendations  None recommended by PT    Recommendations for Other Services       Precautions / Restrictions Precautions Precautions: Fall Recall of Precautions/Restrictions: Intact Restrictions Weight Bearing Restrictions Per Provider Order: No     Mobility  Bed Mobility Overal bed mobility: Needs Assistance              General bed mobility comments: once pt saw her lunch tray, she refused further activity; +2 total to scoot up in bed, then placed upright with lunch set up for her    Transfers                        Ambulation/Gait                   Stairs             Wheelchair Mobility     Tilt Bed    Modified Rankin (Stroke Patients Only) Modified Rankin (Stroke Patients Only) Pre-Morbid Rankin Score: Moderately severe disability Modified Rankin: Moderately severe disability     Balance                                            Communication Communication Communication: Impaired Factors Affecting Communication: Hearing impaired  Cognition Arousal: Lethargic, Suspect due to medications Behavior During Therapy: Flat affect                             Following commands: Impaired Following commands impaired: Follows one step commands with increased time    Cueing Cueing Techniques: Verbal cues, Tactile cues  Exercises General Exercises - Lower Extremity Ankle Circles/Pumps: AROM, Both, 5 reps Heel Slides: AAROM, Both, Other reps (comment) (3)    General Comments General comments (skin integrity, edema, etc.): Pt  stating she needed to pee and could not use purewick (in place). Offered to help her get up to [facility] and she immediately started using the purewick and stated "no, thank you"      Pertinent Vitals/Pain Pain Assessment Pain Assessment: Faces Faces Pain Scale: Hurts little more Pain Location: general body pain, could not specify where Pain Descriptors / Indicators: Aching, Sore, Moaning Pain Intervention(s): Limited activity within patient's tolerance, Monitored during session, Repositioned    Home Living                          Prior Function            PT Goals (current goals can now be found in the care plan section) Acute Rehab PT Goals Patient Stated Goal: to be able to move  better Time For Goal Achievement: 11/03/23 Potential to Achieve Goals: Good Progress towards PT goals: Not progressing toward goals - comment    Frequency    Min 2X/week      PT Plan      Co-evaluation              AM-PAC PT "6 Clicks" Mobility   Outcome Measure  Help needed turning from your back to your side while in a flat bed without using bedrails?: Total Help needed moving from lying on your back to sitting on the side of a flat bed without using bedrails?: Total Help needed moving to and from a bed to a chair (including a wheelchair)?: Total Help needed standing up from a chair using your arms (e.g., wheelchair or bedside chair)?: Total Help needed to walk in hospital room?: Total Help needed climbing 3-5 steps with a railing? : Total 6 Click Score: 6    End of Session   Activity Tolerance: Patient limited by lethargy Patient left: in bed;with call bell/phone within reach   PT Visit Diagnosis: Muscle weakness (generalized) (M62.81);Difficulty in walking, not elsewhere classified (R26.2);Unsteadiness on feet (R26.81);Other abnormalities of gait and mobility (R26.89)     Time: 1330-1350 PT Time Calculation (min) (ACUTE ONLY): 20 min  Charges:    $Therapeutic Exercise: 8-22 mins PT General Charges $$ ACUTE PT VISIT: 1 Visit                      Gayle Kava, PT Acute Rehabilitation Services  Office [phone]    Guilford Leep 10/22/2023, 2:04 PM

## 2023-10-22 NOTE — Progress Notes (Signed)
 Pt granddaughter called and upset for not being able to get a hold of her grandmother thru her cellphone. Pt is alert and oriented and drowsy most of the day. She reported not being able to sleep good last night and does not pick up phone call. Pt c/o leg pain on and off and tylenol  was given per MD conversation to family to only give Tylenol  and not stronger pain medication. Today, granddaughter was yelling over the phone demanding to give the patient anything stronger for pain, which was already explained by the unit secretary that nurses are in the room and attending to patient and have been provided with prescribed pain management. This RN notified MD. MD assessed the patient and talked to patient daughter.

## 2023-10-23 ENCOUNTER — Encounter (HOSPITAL_COMMUNITY)

## 2023-10-23 DIAGNOSIS — E1165 Type 2 diabetes mellitus with hyperglycemia: Secondary | ICD-10-CM

## 2023-10-23 DIAGNOSIS — M25572 Pain in left ankle and joints of left foot: Secondary | ICD-10-CM

## 2023-10-23 DIAGNOSIS — Z794 Long term (current) use of insulin: Secondary | ICD-10-CM | POA: Diagnosis not present

## 2023-10-23 DIAGNOSIS — E1129 Type 2 diabetes mellitus with other diabetic kidney complication: Secondary | ICD-10-CM | POA: Diagnosis not present

## 2023-10-23 DIAGNOSIS — R531 Weakness: Secondary | ICD-10-CM | POA: Diagnosis not present

## 2023-10-23 DIAGNOSIS — I639 Cerebral infarction, unspecified: Secondary | ICD-10-CM

## 2023-10-23 DIAGNOSIS — Z8673 Personal history of transient ischemic attack (TIA), and cerebral infarction without residual deficits: Secondary | ICD-10-CM

## 2023-10-23 LAB — RENAL FUNCTION PANEL
Albumin: 2.5 g/dL — ABNORMAL LOW (ref 3.5–5.0)
Anion gap: 9 (ref 5–15)
BUN: 27 mg/dL — ABNORMAL HIGH (ref 8–23)
CO2: 26 mmol/L (ref 22–32)
Calcium: 8.7 mg/dL — ABNORMAL LOW (ref 8.9–10.3)
Chloride: 99 mmol/L (ref 98–111)
Creatinine, Ser: 1.51 mg/dL — ABNORMAL HIGH (ref 0.44–1.00)
GFR, Estimated: 34 mL/min — ABNORMAL LOW (ref >60)
Glucose, Bld: 212 mg/dL — ABNORMAL HIGH (ref 70–99)
Phosphorus: 3.2 mg/dL (ref 2.5–4.6)
Potassium: 4.2 mmol/L (ref 3.5–5.1)
Sodium: 134 mmol/L — ABNORMAL LOW (ref 135–145)

## 2023-10-23 LAB — CBC
HCT: 33.5 % — ABNORMAL LOW (ref 36.0–46.0)
Hemoglobin: 10.8 g/dL — ABNORMAL LOW (ref 12.0–15.0)
MCH: 28.4 pg (ref 26.0–34.0)
MCHC: 32.2 g/dL (ref 30.0–36.0)
MCV: 88.2 fL (ref 80.0–100.0)
Platelets: 532 10*3/uL — ABNORMAL HIGH (ref 150–400)
RBC: 3.8 MIL/uL — ABNORMAL LOW (ref 3.87–5.11)
RDW: 14.6 % (ref 11.5–15.5)
WBC: 7.4 10*3/uL (ref 4.0–10.5)
nRBC: 0 % (ref 0.0–0.2)

## 2023-10-23 LAB — GLUCOSE, CAPILLARY
Glucose-Capillary: 224 mg/dL — ABNORMAL HIGH (ref 70–99)
Glucose-Capillary: 286 mg/dL — ABNORMAL HIGH (ref 70–99)
Glucose-Capillary: 274 mg/dL — ABNORMAL HIGH (ref 70–99)
Glucose-Capillary: 238 mg/dL — ABNORMAL HIGH (ref 70–99)
Glucose-Capillary: 162 mg/dL — ABNORMAL HIGH (ref 70–99)

## 2023-10-23 LAB — MAGNESIUM: Magnesium: 2 mg/dL (ref 1.7–2.4)

## 2023-10-23 MED ORDER — GABAPENTIN 300 MG PO CAPS
300.0000 mg | ORAL_CAPSULE | Freq: Every day | ORAL | Status: DC
  Administered 2023-10-23: 300 mg via ORAL
  Filled 2023-10-23: qty 1

## 2023-10-23 MED ORDER — POLYETHYLENE GLYCOL 3350 17 G PO PACK
17.0000 g | PACK | Freq: Two times a day (BID) | ORAL | Status: DC
  Administered 2023-10-23 – 2023-10-24 (×2): 17 g via ORAL
  Filled 2023-10-23 (×2): qty 1

## 2023-10-23 MED ORDER — INSULIN ASPART 100 UNIT/ML IJ SOLN: 0.0000 [IU] | Freq: Every day | INTRAMUSCULAR | Status: DC

## 2023-10-23 MED ORDER — INSULIN ASPART 100 UNIT/ML IJ SOLN: 4.0000 [IU] | Freq: Three times a day (TID) | INTRAMUSCULAR | Status: DC

## 2023-10-23 MED ORDER — INSULIN ASPART 100 UNIT/ML IJ SOLN
0.0000 [IU] | Freq: Three times a day (TID) | INTRAMUSCULAR | Status: DC
  Administered 2023-10-23: 5 [IU] via SUBCUTANEOUS
  Administered 2023-10-23: 3 [IU] via SUBCUTANEOUS
  Administered 2023-10-24: 2 [IU] via SUBCUTANEOUS
  Administered 2023-10-24 (×2): 3 [IU] via SUBCUTANEOUS

## 2023-10-23 MED ORDER — INSULIN GLARGINE-YFGN 100 UNIT/ML ~~LOC~~ SOLN
8.0000 [IU] | Freq: Two times a day (BID) | SUBCUTANEOUS | Status: DC
  Administered 2023-10-23 – 2023-10-24 (×2): 8 [IU] via SUBCUTANEOUS
  Filled 2023-10-23 (×3): qty 0.08

## 2023-10-23 MED ORDER — INSULIN ASPART 100 UNIT/ML IJ SOLN
3.0000 [IU] | Freq: Three times a day (TID) | INTRAMUSCULAR | Status: DC
  Administered 2023-10-23 – 2023-10-24 (×2): 3 [IU] via SUBCUTANEOUS

## 2023-10-23 NOTE — Progress Notes (Signed)
 [facility] PROGRESS NOTE    Progress Note  Alexis Farmer  UJW:[id number] DOB: Jan 15, 1941 DOA: 10/19/2023 PCP: Valli Gaw, MD     Brief Narrative:   [name] is an 83 y.o. female past medical history of chronic kidney disease stage IIIb, diabetes mellitus type 2, essential hypertension, HFpEF, B-cell lymphoma comes in complaining of numbness and weakness of the left leg found to have a right frontal and left parietal lobe CVA  Assessment/Plan:   Acute ischemic infarct: HgbA1c 8.7, fasting lipid panel LDL 62 HDL 47. MRI showed acute nonhemorrhagic cortical infarct in the right frontal and left parietal lobe, punctuate cortical infarct in the right gyrus. MRI showed absent blood flow to the right vertebral artery and proximal right ICA. PT, OT, evaluated the patient will need skilled nursing facility. Transthoracic Echo, EF of 60%. Before admission she was not on any antithrombotic therapy continue ASA 81mg  daily and plavix 75mg  daily for 3 weeks then aspirin  alone. Continue atorvastatin No events Telemetry monitoring Neurology recommended Doppler which is unchanged from prior.  Anxiety: Continue Ativan  twice a day as needed as her home dose.  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 X-ray showed swelling about the ankle no fracture or dislocation no joint effusion. Lower extremity Doppler results are pending. Likely diabetic peripheral neuropathy restart Neurontin   HFpEF /essential hypertension: Continue Coreg , hydralazine ,  and torsemide . She appears to be euvolemic. Aldactone  to be resumed as an outpatient.  Hyperlipidemia: Continue Crestor .  Type 2 diabetes mellitus with other diabetic kidney complication (HCC) With an A1c of 8.7. Currently on long-acting insulin   plus sliding scale blood glucose still greater than 200.   DVT prophylaxis: lovenox  Family Communication:none Status is: Inpatient Remains inpatient appropriate because: Acute ischemic infarct    Code Status:     Code Status Orders  (From admission, onward)           Start     Ordered   10/20/23 1922  Full code  (Code Status)  Continuous       Question:  By:  Answer:  Consent: discussion documented in EHR   10/20/23 1921           Code Status History     Date Active Date Inactive Code Status Order ID Comments User Context   09/29/2023 2144 10/15/2023 2228 Full Code [id number]  Lanetta Pion, MD ED   08/03/2023 2335 08/04/2023 2158 Full Code [id number]  Josph Nimrod, MD ED   04/01/2023 1129 04/02/2023 2232 Full Code [id number]  Corrinne Din, MD ED   10/22/2022 1410 10/23/2022 1808 Full Code [id number]  Avi Body, MD ED   08/12/2021 1542 08/15/2021 2300 Full Code [id number]  [place], MD ED   12/15/2020 0817 12/16/2020 2037 Full Code [id number]  Lena Qualia, MD ED   07/07/2018 0850 07/07/2018 1639 Full Code [id number]  Prescilla Brod, MD Inpatient   07/30/2016 0106 07/31/2016 2056 Full Code [id number]  Elvin Hammer, MD Inpatient   08/03/2015 0656 08/04/2015 2130 Full Code [id number]  Velna Ghee Inpatient         IV Access:   Peripheral IV   Procedures and diagnostic studies:   DG Ankle Left Port Result Date: 10/22/2023 CLINICAL DATA:  Left ankle pain, numbness, twitching EXAM: PORTABLE LEFT ANKLE - 2 VIEW COMPARISON:  Radiographs 08/03/2023 FINDINGS: Swelling about the left ankle. No acute fracture or dislocation. Plantar calcaneal spur. Vascular calcifications. Findings are unchanged from 08/03/2023. IMPRESSION: Swelling about the left ankle without acute fracture or dislocation. Electronically Signed   By: Rozell Cornet M.D.   On: 10/22/2023 21:18   VAS US  CAROTID Result Date: 10/22/2023 Carotid Arterial Duplex Study Patient Name:  Alexis Farmer   Date of Exam:   10/21/2023 Medical Rec #: [id number]         Accession #:    [id number] Date of Birth: December 08, 1940         Patient Gender: F Patient Age:   39 years Exam Location:  [facility] Procedure:      VAS US  CAROTID Referring Phys: Jonette Nestle --------------------------------------------------------------------------------  Indications:       CVA. Risk Factors:      Hypertension, hyperlipidemia, Diabetes, prior CVA. 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 Performing Technologist: Estanislao Heimlich  Examination Guidelines: A complete evaluation includes B-mode imaging, spectral Doppler, color Doppler, and power Doppler as needed of all accessible portions of each vessel. Bilateral testing is considered an integral part of a complete examination. Limited examinations for reoccurring indications may be performed as noted.  Right Carotid Findings: +----------+--------+--------+--------+--------------------+------------------+           PSV cm/sEDV cm/sStenosisPlaque Description  Comments           +----------+--------+--------+--------+--------------------+------------------+ CCA Prox  50      7                                   intimal thickening +----------+--------+--------+--------+--------------------+------------------+ CCA Distal71      0                                   intimal thickening +----------+--------+--------+--------+--------------------+------------------+ ICA Prox  218     0       1-39%   diffuse and calcific                   +----------+--------+--------+--------+--------------------+------------------+ ICA Mid   140     14      1-39%   calcific                               +----------+--------+--------+--------+--------------------+------------------+ ICA Distal75      9                                                       +----------+--------+--------+--------+--------------------+------------------+ ECA       210     0                                                      +----------+--------+--------+--------+--------------------+------------------+ +----------+--------+-------+--------+-------------------+           PSV cm/sEDV  cmsDescribeArm Pressure (mmHG) +----------+--------+-------+--------+-------------------+ Subclavian160                                        +----------+--------+-------+--------+-------------------+ +---------+--------+--+--------+-+----------+ VertebralPSV cm/s81EDV cm/s0Retrograde +---------+--------+--+--------+-+----------+  Left Carotid Findings: +----------+--------+--------+--------+------------------+------------------+           PSV cm/sEDV cm/sStenosisPlaque DescriptionComments           +----------+--------+--------+--------+------------------+------------------+ CCA Prox  63      6                                 intimal thickening +----------+--------+--------+--------+------------------+------------------+ CCA Distal101     7                                 intimal thickening +----------+--------+--------+--------+------------------+------------------+ ICA Prox  63      6       1-39%   calcific and focal                   +----------+--------+--------+--------+------------------+------------------+ ICA Distal81      11                                                   +----------+--------+--------+--------+------------------+------------------+ ECA       187     0                                                    +----------+--------+--------+--------+------------------+------------------+ +----------+--------+--------+--------+-------------------+           PSV cm/sEDV cm/sDescribeArm Pressure (mmHG) +----------+--------+--------+--------+-------------------+ Subclavian226     0                                    +----------+--------+--------+--------+-------------------+ +---------+--------+---+--------+-+ VertebralPSV cm/s100EDV cm/s8 +---------+--------+---+--------+-+   Summary: Right Carotid: Velocities in the right ICA are consistent with a 1-39% stenosis. Left Carotid: Velocities in the left ICA are consistent with a 1-39% stenosis. Vertebrals:  Left vertebral artery demonstrates antegrade flow. Right vertebral              artery demonstrates retrograde flow. Subclavians: Left subclavian artery was stenotic. Normal flow hemodynamics were              seen in bilateral subclavian arteries. *See table(s) above for measurements and observations.  Electronically signed by Ardella Beaver MD on 10/22/2023 at 10:33:01 AM.    Final      Medical Consultants:   None.   Subjective:    Shalinda Gunn relates she has had this pain bilaterally in her lower extremity mid shin down for years, left more than right  Objective:    Vitals:   10/23/23 0538 10/23/23 0743 10/23/23 0859 10/23/23 1123  BP: (!) 148/45 (!) 161/53 (!) 161/53 100/74  Pulse: 73 75  77  Resp: 20 18  18   Temp:  99 F (37.2 C)  98.3 F (36.8 C)  TempSrc:  Axillary  Axillary  SpO2: 95% 98%  99%  Weight:      Height:       SpO2: 99 %   Intake/Output Summary (Last 24 hours) at 10/23/2023 1135 Last data filed at 10/23/2023 0538 Gross per 24 hour  Intake 720 ml  Output 650 ml  Net 70 ml   Filed Weights   10/20/23 1602 10/21/23 0426  Weight: 105.4 kg 100.4 kg    Exam: General exam: In no acute distress. Respiratory system: Good air movement and clear to auscultation. Cardiovascular system: S1 & S2 heard, RRR. No JVD. Gastrointestinal system: Abdomen is nondistended, soft and nontender.  Central nervous system: Alert and oriented. No focal neurological deficits. Extremities: Swelling able to move the ankle passively and actively not exquisitely tender to palpation Skin: No erythema no rashes no swelling Psychiatry:  Judgement and insight appear normal. Mood & affect appropriate.    Data Reviewed:    Labs: Basic Metabolic Panel: Recent Labs  Lab 10/19/23 1109 10/20/23 2102 10/21/23 0651 10/22/23 0440 10/23/23 0329  NA 131* 133* 133* 135 134*  K 4.0 4.2 4.5 3.9 4.2  CL 91* 97* 98 99 99  CO2 28 27 25 27 26   GLUCOSE 202* 191* 252* 149* 212*  BUN 42* 35* 32* 28* 27*  CREATININE 2.57* 1.98* 1.79* 1.62* 1.51*  CALCIUM  9.1 9.4 9.1 8.7* 8.7*  MG  --   --  1.8 1.6* 2.0  PHOS  --  3.5 3.4 3.2 3.2   GFR Estimated Creatinine Clearance: 31.3 mL/min (A) (by C-G formula based on SCr of 1.51 mg/dL (H)). Liver Function Tests: Recent Labs  Lab 10/19/23 1109 10/20/23 2102 10/21/23 0651 10/22/23 0440 10/23/23 0329  AST 34  --   --   --   --   ALT 20  --   --   --   --   ALKPHOS 44  --   --   --   --   BILITOT 0.7  --   --   --   --   PROT 6.1*  --   --   --   --   ALBUMIN 2.9* 2.7* 2.7* 2.5* 2.5*   No results for input(s): "LIPASE", "AMYLASE" in the last 168 hours. No results for input(s): "AMMONIA" in the last 168 hours. Coagulation profile No results for input(s): "INR", "PROTIME" in the last 168 hours. COVID-19 Labs  No results for input(s): "DDIMER", "FERRITIN", "LDH", "CRP" in the last 72 hours.  Lab Results  Component Value Date   SARSCOV2NAA NEGATIVE 09/29/2023   SARSCOV2NAA POSITIVE (A) 03/15/2023   SARSCOV2NAA NEGATIVE 11/27/2022   SARSCOV2NAA NEGATIVE 08/12/2021    CBC: Recent Labs  Lab 10/19/23 1109 10/21/23 0651 10/22/23 0440 10/23/23 0329  WBC 8.6 8.6 9.4 7.4  NEUTROABS 3.4  --   --   --   HGB 11.8* 11.9* 10.9* 10.8*  HCT 36.8 37.3 33.1* 33.5*  MCV 89.1 89.9 87.6 88.2  PLT 591* 546* 512* 532*   Cardiac Enzymes: No results for input(s): "CKTOTAL", "CKMB", "CKMBINDEX", "TROPONINI" in the last 168 hours. BNP (last 3 results) No results for input(s): "PROBNP" in the last 8760 hours. CBG: Recent Labs  Lab 10/22/23 1221 10/22/23 1629 10/22/23 2056  10/23/23 0741 10/23/23 1116  GLUCAP 228* 214* 218* 224* 286*   D-Dimer: No results for input(s): "DDIMER" in the last 72 hours. Hgb A1c: No results for input(s): "HGBA1C" in the last 72 hours. Lipid Profile: No results for input(s): "CHOL", "HDL", "LDLCALC", "TRIG", "CHOLHDL", "LDLDIRECT" in the last 72 hours.  Thyroid  function studies: No results for input(s): "TSH", "T4TOTAL", "T3FREE", "THYROIDAB" in the last 72 hours.  Invalid input(s): "FREET3" Anemia work up: No results for input(s): "VITAMINB12", "FOLATE", "FERRITIN", "TIBC", "IRON", "RETICCTPCT" in the last 72 hours. Sepsis Labs: Recent Labs  Lab 10/19/23 1109 10/21/23 0651 10/22/23 0440 10/23/23 0329  WBC 8.6 8.6 9.4 7.4   Microbiology No results found for this or any previous visit (from the past 240 hours).   Medications:    aspirin  EC  81 mg Oral Daily   carvedilol   3.125 mg Oral BID WC   clopidogrel  75 mg Oral Daily   gabapentin   100 mg Oral QHS   heparin  injection (subcutaneous)  5,000 Units Subcutaneous Q8H   hydrALAZINE   100 mg Oral TID   insulin  aspart  0-15 Units Subcutaneous TID WC   insulin  aspart  0-5 Units Subcutaneous QHS   insulin  glargine-yfgn  15 Units Subcutaneous QHS   polyethylene glycol  17 g Oral BID   rosuvastatin   20 mg Oral Daily   torsemide   20 mg Oral Daily   Continuous Infusions:    LOS: 4 days   Macdonald Savoy  [facility]  10/23/2023, 11:35 AM

## 2023-10-23 NOTE — TOC Progression Note (Signed)
 Transition of Care [facility]) - Progression Note    Patient Details  Name: Alexis Farmer MRN: [id number] Date of Birth: September 23, 1940  Transition of Care [facility]) CM/SW Contact  Carmon Christen, LCSWA Phone Number: 10/23/2023, 11:45 AM  Clinical Narrative:     Patient has SNF bed at [facility] when medically stable for dc. CSW will continue to follow and assist with patients dc planning needs.  Expected Discharge Plan: Skilled Nursing Facility Barriers to Discharge: Continued Medical Work up  Expected Discharge Plan and Services In-house Referral: Clinical Social Work     Living arrangements for the past 2 months:  (came from [facility] short term,lives at home with Granddaughter)                                       Social Determinants of Health (SDOH) Interventions SDOH Screenings   Food Insecurity: No Food Insecurity (10/21/2023)  Recent Concern: Food Insecurity - Food Insecurity Present (10/03/2023)  Housing: Low Risk  (10/21/2023)  Transportation Needs: No Transportation Needs (10/21/2023)  Recent Concern: Transportation Needs - Unmet Transportation Needs (09/23/2023)  Utilities: Not At Risk (10/21/2023)  Alcohol Screen: Low Risk  (11/29/2022)  Depression (PHQ2-9): High Risk (09/23/2023)  Financial Resource Strain: Medium Risk (10/03/2023)  Physical Activity: Inactive (10/03/2023)  Social Connections: Moderately Integrated (10/21/2023)  Stress: Stress Concern Present (11/29/2022)  Tobacco Use: Low Risk  (10/21/2023)    Readmission Risk Interventions    10/03/2023   12:03 PM  Readmission Risk Prevention Plan  Transportation Screening Complete  PCP or Specialist Appt within 3-5 Days Complete  HRI or Home Care Consult Complete  Social Work Consult for Recovery Care Planning/Counseling Complete  Palliative Care Screening Not Applicable  Medication Review Oceanographer) Complete

## 2023-10-23 NOTE — Progress Notes (Signed)
 Physical Therapy Treatment Patient Details Name: Alexis Farmer MRN: [id number] DOB: 08-Nov-1940 Today's Date: 10/23/2023   History of Present Illness 83 y.o. female [facility] from SNF with with numbness and weakness in LLE and abnormal gait. Progressed to B LE numbness and R hand numbness. 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 Recent d/c for CHF exacerbation. PMHx: monoclonal B-cell lymphoma, stage IIIb CKD, DM, HTN, HFpEF, chronic pain, obesity, vertigo    PT Comments  Pt in bed upon arrival and agreeable to PT session. Pt progressed in today's session by requiring less assistance for bed mobility. Pt was able to move from supine/sit with MinA and return to supine with ModA. Pt declined transfer, however, was agreeable to work on forward weight shifts with tricep extension to clear bottom. Performed exercises while seated on EOB with slight increase in LLE pain. Pt is progressing towards goals. Continue to recommend <3hrs post acute rehab to work towards independence with mobility. Acute PT to follow.      If plan is discharge home, recommend the following: Assistance with cooking/housework;Assist for transportation;Two people to help with walking and/or transfers;Help with stairs or ramp for entrance   Can travel by private vehicle     No  Equipment Recommendations  None recommended by PT       Precautions / Restrictions Precautions Precautions: Fall Recall of Precautions/Restrictions: Intact Restrictions Weight Bearing Restrictions Per Provider Order: No     Mobility  Bed Mobility Overal bed mobility: Needs Assistance Bed Mobility: Supine to Sit, Sit to Supine    Supine to sit: Min assist, HOB elevated, Used rails Sit to supine: Mod assist   General bed mobility comments: MinA to raise trunk. ModA for return to  supine for B LE management. Pt able to pull self towards HOB in supine with use of rails    Transfers    General transfer comment: pt declined   Modified Rankin (Stroke Patients Only) Modified Rankin (Stroke Patients Only) Pre-Morbid Rankin Score: Moderately severe disability Modified Rankin: Moderately severe disability     Balance Overall balance assessment: Needs assistance Sitting-balance support: No upper extremity supported, Feet supported Sitting balance-Leahy Scale: Fair       Hotel manager: Impaired Factors Affecting Communication: Hearing impaired  Cognition Arousal: Alert Behavior During Therapy: Flat affect   PT - Cognitive impairments: No apparent impairments   Following commands: Intact      Cueing Cueing Techniques: Verbal cues, Tactile cues  Exercises General Exercises - Lower Extremity Ankle Circles/Pumps: AROM, 10 reps, Seated Long Arc Quad: AROM, Both, 10 reps, Seated Hip ABduction/ADduction: AROM, Both, 10 reps, Seated Hip Flexion/Marching: AROM, Both, 10 reps, Seated Toe Raises: AROM, Both, 10 reps, Seated Other Exercises Other Exercises: x10 anterior lean with B tricep push    General Comments General comments (skin integrity, edema, etc.): Cleared for PT session per RN      Pertinent Vitals/Pain Pain Assessment Pain Assessment: Faces Faces Pain Scale: Hurts little more Pain Location: L LE Pain Descriptors / Indicators: Aching, Sore, Moaning Pain Intervention(s): Limited activity within patient's tolerance, Monitored during session, Repositioned     PT Goals (current goals can now be found in the care plan section) Acute Rehab PT Goals Patient Stated Goal: to be able to move better PT Goal Formulation: With patient Time For Goal Achievement: 11/03/23 Potential  to Achieve Goals: Good Progress towards PT goals: Progressing toward goals    Frequency    Min 2X/week       AM-PAC PT "6 Clicks"  Mobility   Outcome Measure  Help needed turning from your back to your side while in a flat bed without using bedrails?: A Lot Help needed moving from lying on your back to sitting on the side of a flat bed without using bedrails?: A Lot Help needed moving to and from a bed to a chair (including a wheelchair)?: Total Help needed standing up from a chair using your arms (e.g., wheelchair or bedside chair)?: Total Help needed to walk in hospital room?: Total Help needed climbing 3-5 steps with a railing? : Total 6 Click Score: 8    End of Session   Activity Tolerance: Patient tolerated treatment well Patient left: in bed;with call bell/phone within reach Nurse Communication: Mobility status PT Visit Diagnosis: Muscle weakness (generalized) (M62.81);Difficulty in walking, not elsewhere classified (R26.2);Unsteadiness on feet (R26.81);Other abnormalities of gait and mobility (R26.89)     Time: 6440-3474 PT Time Calculation (min) (ACUTE ONLY): 21 min  Charges:    $Therapeutic Exercise: 8-22 mins PT General Charges $$ ACUTE PT VISIT: 1 Visit                    Orysia Blas, PT, DPT Secure Chat Preferred  Rehab Office [phone]    Alissa April Adela Ades 10/23/2023, 4:07 PM

## 2023-10-24 ENCOUNTER — Inpatient Hospital Stay (HOSPITAL_COMMUNITY)

## 2023-10-24 DIAGNOSIS — I5032 Chronic diastolic (congestive) heart failure: Secondary | ICD-10-CM

## 2023-10-24 DIAGNOSIS — I639 Cerebral infarction, unspecified: Secondary | ICD-10-CM | POA: Diagnosis not present

## 2023-10-24 DIAGNOSIS — E1129 Type 2 diabetes mellitus with other diabetic kidney complication: Secondary | ICD-10-CM | POA: Diagnosis not present

## 2023-10-24 DIAGNOSIS — R609 Edema, unspecified: Secondary | ICD-10-CM

## 2023-10-24 DIAGNOSIS — R531 Weakness: Secondary | ICD-10-CM | POA: Diagnosis not present

## 2023-10-24 LAB — GLUCOSE, CAPILLARY
Glucose-Capillary: 176 mg/dL — ABNORMAL HIGH (ref 70–99)
Glucose-Capillary: 210 mg/dL — ABNORMAL HIGH (ref 70–99)
Glucose-Capillary: 207 mg/dL — ABNORMAL HIGH (ref 70–99)

## 2023-10-24 MED ORDER — CLOPIDOGREL BISULFATE 75 MG PO TABS: 75.0000 mg | ORAL_TABLET | Freq: Every day | ORAL | Status: AC

## 2023-10-24 MED ORDER — CLOPIDOGREL BISULFATE 75 MG PO TABS: 75.0000 mg | ORAL_TABLET | Freq: Every day | ORAL | Status: DC

## 2023-10-24 MED ORDER — OXYCODONE HCL 5 MG PO TABS: 5.0000 mg | ORAL_TABLET | Freq: Three times a day (TID) | ORAL | 0 refills | Status: AC | PRN

## 2023-10-24 NOTE — TOC Progression Note (Signed)
 Transition of Care [facility]) - Progression Note    Patient Details  Name: Alexis Farmer MRN: [id number] Date of Birth: Jan 21, 1941  [facility]) CM/SW Contact  Carmon Christen, LCSWA Phone Number: 10/24/2023, 11:40 AM  Clinical Narrative:     Patient has SNF bed at [facility] place when medically stable for dc. CSW will continue to follow and assist with patients dc planning needs.   Expected Discharge Plan: Skilled Nursing Facility Barriers to Discharge: Continued Medical Work up  Expected Discharge Plan and Services In-house Referral: Clinical Social Work     Living arrangements for the past 2 months:  (came from [place] [facility] short term,lives at home with Granddaughter)                                       Social Determinants of Health (SDOH) Interventions SDOH Screenings   Food Insecurity: No Food Insecurity (10/21/2023)  Recent Concern: Food Insecurity - Food Insecurity Present (10/03/2023)  Housing: Low Risk  (10/21/2023)  Transportation Needs: No Transportation Needs (10/21/2023)  Recent Concern: Transportation Needs - Unmet Transportation Needs (09/23/2023)  Utilities: Not At Risk (10/21/2023)  Alcohol Screen: Low Risk  (11/29/2022)  Depression (PHQ2-9): High Risk (09/23/2023)  Financial Resource Strain: Medium Risk (10/03/2023)  Physical Activity: Inactive (10/03/2023)  Social Connections: Moderately Integrated (10/21/2023)  Stress: Stress Concern Present (11/29/2022)  Tobacco Use: Low Risk  (10/21/2023)    Readmission Risk Interventions    10/03/2023   12:03 PM  Readmission Risk Prevention Plan  Transportation Screening Complete  PCP or Specialist Appt within 3-5 Days Complete  HRI or Home Care Consult Complete  Social Work Consult for Recovery Care Planning/Counseling Complete  Palliative Care Screening Not Applicable  Medication Review Oceanographer) Complete

## 2023-10-24 NOTE — TOC Transition Note (Addendum)
 Transition of Care [facility]) - Discharge Note   Patient Details  Name: Alexis Farmer MRN: [id number] Date of Birth: May 07, 1941  Transition of Care [facility]) CM/SW Contact:  Carmon Christen, LCSWA Phone Number: 10/24/2023, 12:56 PM   Clinical Narrative:     Patient will DC to: [facility]  Anticipated DC date: 10/24/2023  Family notified: [name]   Transport by: Lyna Sandhoff  ?  Per MD patient ready for DC to [facility] . RN, patient, patient's family, and facility notified of DC. Discharge Summary sent to facility. RN given number for report [phone] RM# [phone]. DC packet on chart. Ambulance transport requested for patient.  CSW signing off.   Final next level of care: Skilled Nursing Facility Barriers to Discharge: Continued Medical Work up   Patient Goals and CMS Choice Patient states their goals for this hospitalization and ongoing recovery are:: SNF   Choice offered to / list presented to : Patient      Discharge Placement                       Discharge Plan and Services Additional resources added to the After Visit Summary for   In-house Referral: Clinical Social Work                                   Social Drivers of Health (SDOH) Interventions SDOH Screenings   Food Insecurity: No Food Insecurity (10/21/2023)  Recent Concern: Food Insecurity - Food Insecurity Present (10/03/2023)  Housing: Low Risk  (10/21/2023)  Transportation Needs: No Transportation Needs (10/21/2023)  Recent Concern: Transportation Needs - Unmet Transportation Needs (09/23/2023)  Utilities: Not At Risk (10/21/2023)  Alcohol Screen: Low Risk  (11/29/2022)  Depression (PHQ2-9): High Risk (09/23/2023)  Financial Resource Strain: Medium Risk (10/03/2023)  Physical Activity: Inactive (10/03/2023)  Social Connections: Moderately Integrated (10/21/2023)  Stress: Stress Concern Present (11/29/2022)  Tobacco Use: Low Risk  (10/21/2023)     Readmission Risk Interventions    10/03/2023   12:03 PM   Readmission Risk Prevention Plan  Transportation Screening Complete  PCP or Specialist Appt within 3-5 Days Complete  HRI or Home Care Consult Complete  Social Work Consult for Recovery Care Planning/Counseling Complete  Palliative Care Screening Not Applicable  Medication Review Oceanographer) Complete

## 2023-10-24 NOTE — Progress Notes (Signed)
 Speech Language Pathology Treatment: Cognitive-Linquistic  Patient Details Name: Alexis Farmer MRN: [id number] DOB: [birth year] Today's Date: 10/24/2023 Time: 4098-1191 SLP Time Calculation (min) (ACUTE ONLY): 25 min  Assessment / Plan / Recommendation Clinical Impression  Pt seen for cognitive/linguistic f/u session.  She was oriented to all but date, but prior level of cognition unknown as no family available.  Pt tearful and distracted/agitated as she was waiting for a laxative, so redirection provided to discuss current goals of care. Pt aware of deficits, but emotional lability noted intermittently throughout session until nursing staff came with medication during session.  Pt initially refused tx, but then agreed once redirected to participate in cognitive tx session.  Pt able to follow functional directives with improved accuracy and noted "I have some memory problems sometimes."  Pt in rehab prior to this hospital stay and wishes to return when able.  Sustained attention improved for simple problem solving/providing prior pmhx/concerns.  ST would be beneficial to f/u at next venue of care for cognitive reorganization/compensatory strategies prn.    HPI HPI: [name] is a 83 y.o. female with past medical history of monoclonal B-cell lymphoma, stage IIIb CKD, DM, HTN, HFpEF, chronic pain, obesity, presenting with numbness and weakness in left lower extremity difficulty walking. Last known well was 1900 yesterday currently patient reports bilateral leg numbness and right hand numbness. She was recently discharged after being admitted for congestive heart failure exacerbation. No reports of chest pain shortness of breath fevers nausea vomiting diarrhea. MRI of her head is negative for acute findings.ST f/u for cognitive reorganization/tx.      SLP Plan  Discharge SLP treatment due to (progress made)      Recommendations for follow up therapy are one component of a multi-disciplinary  discharge planning process, led by the attending physician.  Recommendations may be updated based on patient status, additional functional criteria and insurance authorization.    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

## 2023-10-24 NOTE — Discharge Summary (Signed)
 Physician Discharge Summary  Alexis Farmer ZOX:[id number] DOB: May 30, 1941 DOA: 10/19/2023  PCP: Valli Gaw, MD  Admit date: 10/19/2023 Discharge date: 10/24/2023  Admitted From: SNF Disposition:  SNF  Recommendations for Outpatient Follow-up:  Follow up with PCP in 1-2 weeks Please obtain BMP/CBC in one week   Home Health:No Equipment/Devices:None  Discharge Condition:Stable CODE STATUS:Full Diet recommendation: Heart Healthy  Brief/Interim Summary: 83 y.o. 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 She was on dual statin. MRI showed acute nonhemorrhagic cortical infarct in the right frontal and left parietal lobe. MRI show absent blood flow to the vertebral arteries. PT OT evaluated the patient recommended back to skilled nursing facility. 2D echo showed an EF of 60%. She will go home on aspirin  and Plavix for 3 weeks then aspirin  alone.  Diabetic peripheral neuropathy left greater than right: She relates she has had this pain on for years. Bilateral lower extremity mid shin down x-ray showed no fracture of the left ankle. Lower extremity Doppler showed no evidence of DVT. She was restarted on her Neurontin .  HFpEF/essential hypertension: No changes made to her medication.  Hyperlipidemia:  continue  Crestor .  Diabetes mellitus type 2: No change made to her medication continue current insulin  regimen as an outpatient. A1c was 8.7.  Discharge Instructions  Discharge Instructions     Diet - low sodium heart healthy   Complete by: As directed    Increase activity slowly   Complete by: As directed       Allergies as of 10/24/2023       Reactions   Celexa  [citalopram ]    Diarrhea upset stomach   Jardiance  [empagliflozin ] Other (See Comments)   Reaction not recalled   Norvasc  [amlodipine ]    Leg edema   Tape Other (See Comments)   Leaves the skin "raw" if left on for a period of time- tolerates paper tape   Penicillin V Rash   Penicillin V Potassium Rash   Penicillins Rash   Has patient had a PCN reaction causing immediate rash, facial/tongue/throat swelling, SOB or lightheadedness with hypotension: Yes Has patient had a PCN reaction causing severe rash involving mucus membranes or skin necrosis: No Has patient had a PCN reaction that required hospitalization No Has patient had a PCN reaction occurring within the last 10 years: Yes If all of the above answers are "NO", then may proceed with Cephalosporin use.        Medication List     TAKE these medications    Accu-Chek Guide test strip Generic drug: glucose blood USE AS INSTRUCTED 3 TIMES DAILY   acetaminophen  500 MG tablet Commonly known as: TYLENOL  Take 500 mg by mouth every 6 (six) hours as needed for mild pain or headache.   aspirin  EC 81 MG tablet Take 81 mg by mouth 2 (two) times a week. Swallow whole.   BD Insulin   Syringe U/F 30G X 1/2" 0.5 ML Misc Generic drug: Insulin  Syringe-Needle U-100 USE UP TO 5X PER DAY WITH INSULIN  2X (NPH) AND 3X (REGULAR)   benzonatate  200 MG capsule Commonly known as: TESSALON  Take 200 mg by mouth 3 (three) times daily as needed for cough.   blood glucose meter kit and supplies Kit Accu chek, Dx code E11.65, check 3 times daily   Blood Pressure Kit 1 Device by Does  not apply route daily.   carvedilol  6.25 MG tablet Commonly known as: COREG  Take 1 tablet (6.25 mg total) by mouth 2 (two) times daily with a meal.   Centrum Silver 50+Women Tabs Take 1 tablet by mouth daily with breakfast.   clopidogrel 75 MG tablet Commonly known as: PLAVIX Take 1 tablet (75 mg total) by mouth daily. Start taking on: Oct 25, 2023   gabapentin  300 MG capsule Commonly known as: NEURONTIN  Take 300 mg by mouth at bedtime.   hydrALAZINE  100 MG tablet Commonly known as: APRESOLINE  Take 1 tablet (100 mg total) by mouth 3 (three) times daily. TAKE 1 TABLET (100 MG TOTAL) BY MOUTH 4 (FOUR) TIMES DAILY. Must keep upcoming appointment on 10/21/23 for future refills. What changed: when to take this   insulin  regular 100 units/mL injection Commonly known as: NovoLIN R Inject 0-0.08 mLs (0-8 Units total) into the skin 3 (three) times daily before meals.   ipratropium-albuterol  0.5-2.5 (3) MG/3ML Soln Commonly known as: DUONEB Take 3 mLs by nebulization 4 (four) times daily. Take scheduled 4 times daily until wheezing and shortness of breath from RSV improves. Then take as needed.   LORazepam  1 MG tablet Commonly known as: ATIVAN  Take 0.5 tablets (0.5 mg total) by mouth daily as needed for anxiety. Inform pt 0.5 dose on backorder has to cut this in 1/2 dose   NovoLIN N 100 UNIT/ML injection Generic drug: insulin  NPH Human Inject 15-20 Units into the skin 2 (two) times daily before a meal. Inject 20 units in the morning an 15 units in the evening.   oxyCODONE  5 MG immediate release tablet Commonly known as: Oxy IR/ROXICODONE  Take 1 tablet (5 mg total) by mouth every 8 (eight) hours as needed for up to 3 days for severe pain (pain score 7-10).   polyethylene glycol powder 17 GM/SCOOP powder Commonly known as: GLYCOLAX /MIRALAX  Take 17 g by mouth daily as needed for moderate constipation or severe constipation. Can take up to 2x per day prn. Mix with 8 ounces of liquid    rosuvastatin  20 MG tablet Commonly known as: CRESTOR  TAKE 1 TABLET BY MOUTH EVERY DAY   Scot-Tussin Expectorant 100 MG/5ML liquid Generic drug: guaiFENesin Take 20 mLs by mouth 3 (three) times daily as needed for cough.   senna-docusate 8.6-50 MG tablet Commonly known as: Senokot-S Take 1 tablet by mouth 2 (two) times daily.   spironolactone  25 MG tablet Commonly known as: ALDACTONE  Take 25 mg by mouth 3 (three) times a week. Monday Wednesday and friday   torsemide  10 MG tablet Commonly known as: DEMADEX  Take 2 tablets (20 mg total) by mouth daily.   traZODone  50 MG tablet Commonly known as: DESYREL  Take 1 tablet (50 mg total) by mouth at bedtime as needed for sleep.   VITAMIN C PO Take 1 tablet by mouth daily.        Allergies  Allergen Reactions   Celexa  [Citalopram ]     Diarrhea upset stomach    Jardiance  [Empagliflozin ] Other (See Comments)    Reaction not  recalled   Norvasc  [Amlodipine ]     Leg edema   Tape Other (See Comments)    Leaves the skin "raw" if left on for a period of time- tolerates paper tape   Penicillin V Rash   Penicillin V Potassium Rash   Penicillins Rash    Has patient had a PCN reaction causing immediate rash, facial/tongue/throat swelling, SOB or lightheadedness with hypotension: Yes Has patient had a PCN reaction causing severe rash involving mucus membranes or skin necrosis: No Has patient had a PCN reaction that required hospitalization No Has patient had a PCN reaction occurring within the last 10 years: Yes If all of the above answers are "NO", then may proceed with Cephalosporin use.     Consultations: Neurology   Procedures/Studies: DG Ankle Left Port Result Date: 10/22/2023 CLINICAL DATA:  Left ankle pain, numbness, twitching EXAM: PORTABLE LEFT ANKLE - 2 VIEW COMPARISON:  Radiographs 08/03/2023 FINDINGS: Swelling about the left ankle. No acute fracture or dislocation. Plantar calcaneal spur. Vascular calcifications.  Findings are unchanged from 08/03/2023. IMPRESSION: Swelling about the left ankle without acute fracture or dislocation. Electronically Signed   By: Rozell Cornet M.D.   On: 10/22/2023 21:18   VAS US  CAROTID Result Date: 10/22/2023 Carotid Arterial Duplex Study Patient Name:  SAMANTA SUMIDA  Date of Exam:   10/21/2023 Medical Rec #: [id number]         Accession #:    [id number] Date of Birth: Aug 25, 1940         Patient Gender: F Patient Age:   50 years Exam Location:  [facility] Procedure:      VAS US  CAROTID Referring Phys: Jonette Nestle --------------------------------------------------------------------------------  Indications:       CVA. Risk Factors:      Hypertension, hyperlipidemia, Diabetes, prior CVA. 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 Performing Technologist: Estanislao Heimlich  Examination Guidelines: A complete evaluation includes B-mode imaging, spectral Doppler, color Doppler, and power Doppler as needed of all accessible portions of each vessel. Bilateral testing is considered an integral part of a complete examination. Limited examinations for reoccurring indications may be performed as noted.  Right Carotid Findings: +----------+--------+--------+--------+--------------------+------------------+           PSV cm/sEDV cm/sStenosisPlaque Description  Comments           +----------+--------+--------+--------+--------------------+------------------+ CCA Prox  50      7                                   intimal thickening +----------+--------+--------+--------+--------------------+------------------+ CCA Distal71      0                                   intimal thickening +----------+--------+--------+--------+--------------------+------------------+ ICA Prox  218     0       1-39%   diffuse and calcific                    +----------+--------+--------+--------+--------------------+------------------+ ICA Mid   140     14      1-39%   calcific                               +----------+--------+--------+--------+--------------------+------------------+  ICA Distal75      9                                                      +----------+--------+--------+--------+--------------------+------------------+ ECA       210     0                                                      +----------+--------+--------+--------+--------------------+------------------+ +----------+--------+-------+--------+-------------------+           PSV cm/sEDV cmsDescribeArm Pressure (mmHG) +----------+--------+-------+--------+-------------------+ Subclavian160                                        +----------+--------+-------+--------+-------------------+ +---------+--------+--+--------+-+----------+ VertebralPSV cm/s81EDV cm/s0Retrograde +---------+--------+--+--------+-+----------+  Left Carotid Findings: +----------+--------+--------+--------+------------------+------------------+           PSV cm/sEDV cm/sStenosisPlaque DescriptionComments           +----------+--------+--------+--------+------------------+------------------+ CCA Prox  63      6                                 intimal thickening +----------+--------+--------+--------+------------------+------------------+ CCA Distal101     7                                 intimal thickening +----------+--------+--------+--------+------------------+------------------+ ICA Prox  63      6       1-39%   calcific and focal                   +----------+--------+--------+--------+------------------+------------------+ ICA Distal81      11                                                   +----------+--------+--------+--------+------------------+------------------+ ECA       187     0                                                     +----------+--------+--------+--------+------------------+------------------+ +----------+--------+--------+--------+-------------------+           PSV cm/sEDV cm/sDescribeArm Pressure (mmHG) +----------+--------+--------+--------+-------------------+ Subclavian226     0                                   +----------+--------+--------+--------+-------------------+ +---------+--------+---+--------+-+ VertebralPSV cm/s100EDV cm/s8 +---------+--------+---+--------+-+   Summary: Right Carotid: Velocities in the right ICA are consistent with a 1-39% stenosis. Left Carotid: Velocities in the left ICA are consistent with a 1-39% stenosis. Vertebrals:  Left vertebral artery demonstrates antegrade flow. Right vertebral              artery demonstrates retrograde flow. Subclavians: Left subclavian artery was stenotic.  Normal flow hemodynamics were              seen in bilateral subclavian arteries. *See table(s) above for measurements and observations.  Electronically signed by Ardella Beaver MD on 10/22/2023 at 10:33:01 AM.    Final    DG Abd 1 View Result Date: 10/21/2023 CLINICAL DATA:  Abdominal pain. EXAM: ABDOMEN - 1 VIEW COMPARISON:  Radiographs 08/03/2023.  Abdominopelvic CT 10/22/2022. FINDINGS: Two supine views of the abdomen are submitted. There is a normal nonobstructive bowel gas pattern. No supine evidence of pneumoperitoneum or suspicious abdominal calcification. Mildly prominent stool within the left colon. Multilevel spondylosis with mild degenerative changes of both hips. No acute osseous findings are seen. IMPRESSION: No evidence of bowel obstruction or other acute abdominal process. Mildly prominent stool within the left colon. Electronically Signed   By: Elmon Hagedorn M.D.   On: 10/21/2023 15:03   MR ANGIO HEAD WO CONTRAST Result Date: 10/19/2023 CLINICAL DATA:  Stroke, follow up EXAM: MRA NECK WITHOUT CONTRAST MRA HEAD WITHOUT CONTRAST TECHNIQUE: Angiographic images of the [place] of  [place] were acquired using MRA technique without intravenous contrast. COMPARISON:  CTA head/neck May 6, 24. FINDINGS: MRA NECK FINDINGS Motion limited. Aortic arch: Great vessel origins are patent. Right carotid system: Patent with likely at least moderate stenosis of the proximal ICA. Motion limits assessment. Left carotid system: Motion limited evaluation without definite hemodynamically significant stenosis. Vertebral arteries: Absence of flow related signal in the right vertebral artery, suggesting occlusion or proximal high-grade stenosis. Left vertebral artery appears patent without significant stenosis. MRA HEAD FINDINGS Anterior circulation: Bilateral intracranial ICAs, MCAs and ACAs are patent without proximal hemodynamically significant stenosis. Posterior circulation: Diminished but preserved flow related signal in the right intradural vertebral artery. Left intradural vertebral artery is patent without significant stenosis. Basilar artery and bilateral posterior cerebral arteries are patent without proximal hemodynamically significant stenosis. IMPRESSION: 1. Absence of flow related signal in the right vertebral artery, suggesting occlusion or proximal high-grade stenosis. Reconstitution intradurally. 2. 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 3. Given the above, consider CTA to better confirm and better assess. 4. No large vessel occlusion or proximal hemodynamically significant stenosis in the head. Electronically Signed   By: Stevenson Elbe M.D.   On: 10/19/2023 19:16   MR ANGIO NECK WO CONTRAST Result Date: 10/19/2023 CLINICAL DATA:  Stroke, follow up EXAM: MRA NECK WITHOUT CONTRAST MRA HEAD WITHOUT CONTRAST TECHNIQUE: Angiographic images of the [place] of [place] were acquired using MRA technique without intravenous contrast. COMPARISON:  CTA head/neck May 6, 24. FINDINGS: MRA NECK FINDINGS Motion limited. Aortic arch: Great vessel origins  are patent. Right carotid system: Patent with likely at least moderate stenosis of the proximal ICA. Motion limits assessment. Left carotid system: Motion limited evaluation without definite hemodynamically significant stenosis. Vertebral arteries: Absence of flow related signal in the right vertebral artery, suggesting occlusion or proximal high-grade stenosis. Left vertebral artery appears patent without significant stenosis. MRA HEAD FINDINGS Anterior circulation: Bilateral intracranial ICAs, MCAs and ACAs are patent without proximal hemodynamically significant stenosis. Posterior circulation: Diminished but preserved flow related signal in the right intradural vertebral artery. Left intradural vertebral artery is patent without significant stenosis. Basilar artery and bilateral posterior cerebral arteries are patent without proximal hemodynamically significant stenosis. IMPRESSION: 1. Absence of flow related signal in the right vertebral artery, suggesting occlusion or proximal high-grade stenosis. Reconstitution intradurally. 2. Motion limited MRA of the neck with  likely at least moderate stenosis of the proximal right ICA. 3. Given the above, consider CTA to better confirm and better assess. 4. No large vessel occlusion or proximal hemodynamically significant stenosis in the head. Electronically Signed   By: Stevenson Elbe M.D.   On: 10/19/2023 19:16   MR BRAIN WO CONTRAST Result Date: 10/19/2023 CLINICAL DATA:  Neuro deficit, acute, stroke suspected. Numbness and weakness in the left lower extremity. Abnormal gait. Last known well at 7 p.m. yesterday. EXAM: MRI HEAD WITHOUT CONTRAST TECHNIQUE: Multiplanar, multiecho pulse sequences of the brain and surrounding structures were obtained without intravenous contrast. COMPARISON:  CT head without contrast 08/03/2023. MR head without contrast 11/27/2022. FINDINGS: Brain: Acute nonhemorrhagic cortical infarct is present in the medial right frontal lobe. T2 and  FLAIR signal hyperintensities are associated. A 10 mm cortical infarct is present in the medial left parietal lobe. Punctate cortical infarct is present in the right precentral gyrus on image 39 of series 2. Additional punctate infarcts are present in the right frontal operculum and right middle frontal gyrus. Separate from the acute/subacute infarcts is a stable. Confluent periventricular and subcortical T2 hyperintensities, advanced for age. A late subacute infarct is present in the lateral right occipital lobe. No acute hemorrhage or mass lesion is present. The ventricles are proportionate to the degree of atrophy. No significant extraaxial fluid collection is present. Vascular: Flow is present in the major intracranial arteries. Skull and upper cervical spine: The craniocervical junction is normal. Upper cervical spine is within normal limits. Marrow signal is unremarkable. Sinuses/Orbits: The paranasal sinuses and mastoid air cells are clear. Bilateral lens replacements are noted. Globes and orbits are otherwise unremarkable. IMPRESSION: 1. 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 2. 10 mm cortical infarct in the medial left parietal lobe. 3. Punctate cortical infarcts in the right precentral gyrus, right frontal operculum, and right middle frontal gyrus. 4. Late subacute infarct in the lateral right occipital lobe. 5. Stable background of confluent periventricular and subcortical T2 hyperintensities bilaterally, advanced for age. This likely reflects the sequela of chronic microvascular ischemia. These results were called by telephone at the time of interpretation on 10/19/2023 at 4:31 pm to provider [name] , who verbally acknowledged these results. Electronically Signed   By: Audree Leas M.D.   On: 10/19/2023 16:32   CARDIAC CATHETERIZATION Result Date: 10/10/2023 Findings: RA = 2 RV = 45/3 PA = 42/10 (17) PCW = 1 Fick cardiac output/index = 4.2/2.0 Thermo  CO/CI = 4.4/2.1 PVR = 3.9 WU Ao sat = 90% PA sat = 50%, 55% PAPi = 11 Assessment: 1. Mild PAH 2. Very low filling pressures after marked diuresis 3. Moderately reduced CO in setting of hypovolemia Plan/Discussion: Will give some fluid. Back. Hold diuretics. Jules Oar, MD 2:10 PM  ECHOCARDIOGRAM COMPLETE Result Date: 09/30/2023    ECHOCARDIOGRAM REPORT   Patient Name:   LENNAN WESELY Date of Exam: 09/30/2023 Medical Rec #:  [id number]        Height:       63.0 in Accession #:    [id number]       Weight:       288.0 lb Date of Birth:  17-Oct-1940        BSA:          2.258 m Patient Age:    82 years         BP:           161/61 mmHg Patient Gender: F  HR:           66 bpm. Exam Location:  [facility] Procedure: 2D Echo, Cardiac Doppler and Color Doppler (Both Spectral and Color            Flow Doppler were utilized during procedure). Indications:     CHF  History:         Patient has prior history of Echocardiogram examinations, most                  recent 04/01/2023. CHF, PAD, Signs/Symptoms:Fatigue; Risk                  Factors:Hypertension, Diabetes and Dyslipidemia. CKD.  Sonographer:     Clarke Crouch Referring Phys:  [id number] Lanetta Pion Diagnosing Phys: Sammy Crisp MD  Sonographer Comments: Patient is obese and suboptimal apical window. IMPRESSIONS  1. Left ventricular ejection fraction, by estimation, is 60 to 65%. The left ventricle has normal function. The left ventricle has no regional wall motion abnormalities. There is moderate left ventricular hypertrophy. Left ventricular diastolic parameters are consistent with Grade I diastolic dysfunction (impaired relaxation). Elevated left atrial pressure.  2. Right ventricular systolic function is normal. The right ventricular size is normal. There is severely elevated pulmonary artery systolic pressure. The estimated right ventricular systolic pressure is 80.0 mmHg.  3. Right atrial size was mildly dilated.  4. The mitral valve is  normal in structure. Trivial mitral valve regurgitation. No evidence of mitral stenosis.  5. The aortic valve is tricuspid. Aortic valve regurgitation is not visualized. No aortic stenosis is present.  6. The inferior vena cava is dilated in size with <50% respiratory variability, suggesting right atrial pressure of 15 mmHg. FINDINGS  Left Ventricle: Left ventricular ejection fraction, by estimation, is 60 to 65%. The left ventricle has normal function. The left ventricle has no regional wall motion abnormalities. The left ventricular internal cavity size was normal in size. There is  moderate left ventricular hypertrophy. Left ventricular diastolic parameters are consistent with Grade I diastolic dysfunction (impaired relaxation). Elevated left atrial pressure. Right Ventricle: The right ventricular size is normal. No increase in right ventricular wall thickness. Right ventricular systolic function is normal. There is severely elevated pulmonary artery systolic pressure. The tricuspid regurgitant velocity is 4.03 m/s, and with an assumed right atrial pressure of 15 mmHg, the estimated right ventricular systolic pressure is 80.0 mmHg. Left Atrium: Left atrial size was normal in size. Right Atrium: Right atrial size was mildly dilated. Pericardium: There is no evidence of pericardial effusion. Mitral Valve: The mitral valve is normal in structure. Trivial mitral valve regurgitation. No evidence of mitral valve stenosis. MV peak gradient, 5.5 mmHg. The mean mitral valve gradient is 2.0 mmHg. Tricuspid Valve: The tricuspid valve is not well visualized. Tricuspid valve regurgitation is trivial. Aortic Valve: The aortic valve is tricuspid. Aortic valve regurgitation is not visualized. No aortic stenosis is present. Aortic valve mean gradient measures 4.0 mmHg. Aortic valve peak gradient measures 8.3 mmHg. Aortic valve area, by VTI measures 2.57 cm. Pulmonic Valve: The pulmonic valve was not well visualized. Pulmonic  valve regurgitation is trivial. No evidence of pulmonic stenosis. Aorta: The aortic root is normal in size and structure. Pulmonary Artery: The pulmonary artery is of normal size. Venous: The inferior vena cava is dilated in size with less than 50% respiratory variability, suggesting right atrial pressure of 15 mmHg. IAS/Shunts: No atrial level shunt detected by color flow Doppler.  LEFT VENTRICLE PLAX 2D LVIDd:  5.00 cm   Diastology LVIDs:         1.20 cm   LV e' medial:    5.03 cm/s LV PW:         1.40 cm   LV E/e' medial:  18.0 LV IVS:        1.60 cm   LV e' lateral:   5.78 cm/s LVOT diam:     1.90 cm   LV E/e' lateral: 15.7 LV SV:         87 LV SV Index:   38 LVOT Area:     2.84 cm  RIGHT VENTRICLE RV Basal diam:  3.35 cm RV Mid diam:    2.90 cm RV S prime:     12.40 cm/s TAPSE (M-mode): 2.4 cm LEFT ATRIUM             Index        RIGHT ATRIUM           Index LA diam:        4.90 cm 2.17 cm/m   RA Area:     21.20 cm LA Vol (A2C):   52.7 ml 23.34 ml/m  RA Volume:   62.80 ml  27.82 ml/m LA Vol (A4C):   70.9 ml 31.40 ml/m LA Biplane Vol: 62.7 ml 27.77 ml/m  AORTIC VALVE                    PULMONIC VALVE AV Area (Vmax):    2.62 cm     PV Vmax:       0.84 m/s AV Area (Vmean):   2.48 cm     PV Peak grad:  2.8 mmHg AV Area (VTI):     2.57 cm AV Vmax:           144.00 cm/s AV Vmean:          89.100 cm/s AV VTI:            0.338 m AV Peak Grad:      8.3 mmHg AV Mean Grad:      4.0 mmHg LVOT Vmax:         133.00 cm/s LVOT Vmean:        77.800 cm/s LVOT VTI:          0.306 m LVOT/AV VTI ratio: 0.91  AORTA Ao Root diam: 3.20 cm MITRAL VALVE                TRICUSPID VALVE MV Area (PHT): 2.91 cm     TR Peak grad:   65.0 mmHg MV Area VTI:   2.08 cm     TR Vmax:        403.00 cm/s MV Peak grad:  5.5 mmHg MV Mean grad:  2.0 mmHg     SHUNTS MV Vmax:       1.17 m/s     Systemic VTI:  0.31 m MV Vmean:      68.0 cm/s    Systemic Diam: 1.90 cm MV Decel Time: 261 msec MV E velocity: 90.70 cm/s MV A velocity: 106.00  cm/s MV E/A ratio:  0.86 Veryl Gottron End MD Electronically signed by Sammy Crisp MD Signature Date/Time: 09/30/2023/12:41:32 PM    Final    DG Chest Port 1 View Result Date: 09/29/2023 CLINICAL DATA:  sob EXAM: PORTABLE CHEST - 1 VIEW COMPARISON:  08/04/2023. FINDINGS: Cardiac silhouette is prominent. There is pulmonary interstitial prominence with vascular congestion. No focal consolidation. No pneumothorax. Small pleural effusions.  Calcified aorta. Thoracic degenerative changes. IMPRESSION: Findings suggest CHF. Electronically Signed   By: Sydell Eva M.D.   On: 09/29/2023 19:25   (Echo, Carotid, EGD, Colonoscopy, ERCP)    Subjective: No complaints  Discharge Exam: Vitals:   10/24/23 0754 10/24/23 1123  BP: (!) 143/68 (!) 159/76  Pulse: (!) 117   Resp: 16   Temp: 99 F (37.2 C)   SpO2: 98%    Vitals:   10/23/23 2343 10/24/23 0414 10/24/23 0754 10/24/23 1123  BP: (!) 157/50 (!) 166/56 (!) 143/68 (!) 159/76  Pulse: 84 76 (!) 117   Resp: 14 16 16    Temp: 98.3 F (36.8 C) 99 F (37.2 C) 99 F (37.2 C)   TempSrc: Oral Oral Oral   SpO2: 99% 95% 98%   Weight:      Height:        General: Pt is alert, awake, not in acute distress Cardiovascular: RRR, S1/S2 +, no rubs, no gallops Respiratory: CTA bilaterally, no wheezing, no rhonchi Abdominal: Soft, NT, ND, bowel sounds + Extremities: no edema, no cyanosis    The results of significant diagnostics from this hospitalization (including imaging, microbiology, ancillary and laboratory) are listed below for reference.     Microbiology: No results found for this or any previous visit (from the past 240 hours).   Labs: BNP (last 3 results) Recent Labs    08/03/23 1938 09/29/23 1836 10/09/23 1317  BNP 735.5* 784.0* 223.7*   Basic Metabolic Panel: Recent Labs  Lab 10/19/23 1109 10/20/23 2102 10/21/23 0651 10/22/23 0440 10/23/23 0329  NA 131* 133* 133* 135 134*  K 4.0 4.2 4.5 3.9 4.2  CL 91* 97* 98 99 99   CO2 28 27 25 27 26   GLUCOSE 202* 191* 252* 149* 212*  BUN 42* 35* 32* 28* 27*  CREATININE 2.57* 1.98* 1.79* 1.62* 1.51*  CALCIUM  9.1 9.4 9.1 8.7* 8.7*  MG  --   --  1.8 1.6* 2.0  PHOS  --  3.5 3.4 3.2 3.2   Liver Function Tests: Recent Labs  Lab 10/19/23 1109 10/20/23 2102 10/21/23 0651 10/22/23 0440 10/23/23 0329  AST 34  --   --   --   --   ALT 20  --   --   --   --   ALKPHOS 44  --   --   --   --   BILITOT 0.7  --   --   --   --   PROT 6.1*  --   --   --   --   ALBUMIN 2.9* 2.7* 2.7* 2.5* 2.5*   No results for input(s): "LIPASE", "AMYLASE" in the last 168 hours. No results for input(s): "AMMONIA" in the last 168 hours. CBC: Recent Labs  Lab 10/19/23 1109 10/21/23 0651 10/22/23 0440 10/23/23 0329  WBC 8.6 8.6 9.4 7.4  NEUTROABS 3.4  --   --   --   HGB 11.8* 11.9* 10.9* 10.8*  HCT 36.8 37.3 33.1* 33.5*  MCV 89.1 89.9 87.6 88.2  PLT 591* 546* 512* 532*   Cardiac Enzymes: No results for input(s): "CKTOTAL", "CKMB", "CKMBINDEX", "TROPONINI" in the last 168 hours. BNP: Invalid input(s): "POCBNP" CBG: Recent Labs  Lab 10/23/23 1116 10/23/23 1326 10/23/23 1714 10/23/23 2111 10/24/23 0751  GLUCAP 286* 274* 238* 162* 176*   D-Dimer No results for input(s): "DDIMER" in the last 72 hours. Hgb A1c No results for input(s): "HGBA1C" in the last 72 hours. Lipid Profile No results for input(s): "CHOL", "  HDL", "LDLCALC", "TRIG", "CHOLHDL", "LDLDIRECT" in the last 72 hours. Thyroid  function studies No results for input(s): "TSH", "T4TOTAL", "T3FREE", "THYROIDAB" in the last 72 hours.  Invalid input(s): "FREET3" Anemia work up No results for input(s): "VITAMINB12", "FOLATE", "FERRITIN", "TIBC", "IRON", "RETICCTPCT" in the last 72 hours. Urinalysis    Component Value Date/Time   COLORURINE YELLOW 10/19/2023 1235   APPEARANCEUR CLEAR 10/19/2023 1235   APPEARANCEUR Clear 10/22/2018 0844   LABSPEC 1.004 (L) 10/19/2023 1235   PHURINE 7.0 10/19/2023 1235   GLUCOSEU  50 (A) 10/19/2023 1235   HGBUR NEGATIVE 10/19/2023 1235   BILIRUBINUR NEGATIVE 10/19/2023 1235   BILIRUBINUR Negative 10/22/2018 0844   KETONESUR NEGATIVE 10/19/2023 1235   PROTEINUR 100 (A) 10/19/2023 1235   UROBILINOGEN 0.2 03/09/2020 1022   NITRITE NEGATIVE 10/19/2023 1235   LEUKOCYTESUR NEGATIVE 10/19/2023 1235   Sepsis Labs Recent Labs  Lab 10/19/23 1109 10/21/23 0651 10/22/23 0440 10/23/23 0329  WBC 8.6 8.6 9.4 7.4   Microbiology No results found for this or any previous visit (from the past 240 hours).    SIGNED:   Macdonald Savoy, MD  [facility] 10/24/2023, 12:15 PM Pager   If 7PM-7AM, please contact night-coverage [email or web address] Password [email or web address]

## 2023-10-24 NOTE — Progress Notes (Signed)
 Report called to [place], LPN @ [facility] no questions at this time. Patient awaiting [facility] transport

## 2023-10-24 NOTE — Progress Notes (Signed)
 VASCULAR LAB    Bilateral lower extremity venous duplex has been performed.  See CV proc for preliminary results.   Alexis Farmer, RVT 10/24/2023, 12:42 PM

## 2023-10-25 ENCOUNTER — Telehealth: Payer: Self-pay | Admitting: *Deleted

## 2023-10-25 DIAGNOSIS — I130 Hypertensive heart and chronic kidney disease with heart failure and stage 1 through stage 4 chronic kidney disease, or unspecified chronic kidney disease: Secondary | ICD-10-CM | POA: Diagnosis not present

## 2023-10-25 DIAGNOSIS — Z6841 Body Mass Index (BMI) 40.0 and over, adult: Secondary | ICD-10-CM

## 2023-10-25 DIAGNOSIS — N1832 Chronic kidney disease, stage 3b: Secondary | ICD-10-CM | POA: Diagnosis not present

## 2023-10-25 DIAGNOSIS — I700 Atherosclerosis of aorta: Secondary | ICD-10-CM

## 2023-10-25 DIAGNOSIS — I5033 Acute on chronic diastolic (congestive) heart failure: Secondary | ICD-10-CM | POA: Diagnosis not present

## 2023-10-25 DIAGNOSIS — Z794 Long term (current) use of insulin: Secondary | ICD-10-CM

## 2023-10-25 DIAGNOSIS — E1122 Type 2 diabetes mellitus with diabetic chronic kidney disease: Secondary | ICD-10-CM | POA: Diagnosis not present

## 2023-10-25 DIAGNOSIS — E1151 Type 2 diabetes mellitus with diabetic peripheral angiopathy without gangrene: Secondary | ICD-10-CM

## 2023-10-25 DIAGNOSIS — E11319 Type 2 diabetes mellitus with unspecified diabetic retinopathy without macular edema: Secondary | ICD-10-CM

## 2023-10-25 DIAGNOSIS — E1142 Type 2 diabetes mellitus with diabetic polyneuropathy: Secondary | ICD-10-CM

## 2023-10-25 DIAGNOSIS — F339 Major depressive disorder, recurrent, unspecified: Secondary | ICD-10-CM

## 2023-10-29 ENCOUNTER — Telehealth (INDEPENDENT_AMBULATORY_CARE_PROVIDER_SITE_OTHER): Payer: Self-pay | Admitting: Podiatry

## 2023-10-29 NOTE — Telephone Encounter (Signed)
 Patient wants you to now that she is in rehab and would like to talk to you.  I ask her if she could come to an appointment and she cannot at this time.

## 2023-10-29 NOTE — Progress Notes (Deleted)
 Advanced Heart Failure Clinic Note   Referring Physician: recent admission PCP: Valli Gaw, MD Cardiologist: Belva Boyden, MD   Chief Complaint:    HPI:  Alexis Farmer is a 83 y/o female with a history of resistant hypertension, monoclonal B-cell lymphoma, chronic HFpEF, obesity, pHTN, type 2 diabetes and CKD stage IIIb.   Admitted 07/22/23 with RSV infection. Due to significant burden of wheezing was started on nebulizer treatments for reactive airway disease. Chronic LE edema, echo from 10/24 with EF around 60%. Pro-BNP currently 1900.    Admitted 09/29/23 with 2 week history of shortness of breath. Found with chest x-ray with findings suggestive of CHF. Elevated troponin likely related to demand ischemia from CHF and elevated BP. Underwent Right heart cath which showed low filling pressures and mild pulmonary arterial hypertension. Echo 09/30/23: EF 60-65% with moderate LVH, normal RV, G1DD, severely elevated PA pressure of 80.0 mmHg. RHC 10/10/23: mild PAH, moderately reduced CO in setting of hypovolemia. Underwent aggressive diuresis with IV Lasix  which was later on switched to oral torsemide  by cardiology. Entresto  held due to renal function. Discharged to SNF after 16 days.   She presents today for her initial HF visit with a chief complaint of    Review of Systems: [y] = yes, [ ]  = no   General: Weight gain [ ] ; Weight loss [ ] ; Anorexia [ ] ; Fatigue [ ] ; Fever [ ] ; Chills [ ] ; Weakness [ ]   Cardiac: Chest pain/pressure [ ] ; Resting SOB [ ] ; Exertional SOB [ ] ; Orthopnea [ ] ; Pedal Edema [ ] ; Palpitations [ ] ; Syncope [ ] ; Presyncope [ ] ; Paroxysmal nocturnal dyspnea[ ]   Pulmonary: Cough [ ] ; Wheezing[ ] ; Hemoptysis[ ] ; Sputum [ ] ; Snoring [ ]   GI: Vomiting[ ] ; Dysphagia[ ] ; Melena[ ] ; Hematochezia [ ] ; Heartburn[ ] ; Abdominal pain [ ] ; Constipation [ ] ; Diarrhea [ ] ; BRBPR [ ]   GU: Hematuria[ ] ; Dysuria [ ] ; Nocturia[ ]   Vascular: Pain in legs with walking [ ] ; Pain in feet with  lying flat [ ] ; Non-healing sores [ ] ; Stroke [ ] ; TIA [ ] ; Slurred speech [ ] ;  Neuro: Headaches[ ] ; Vertigo[ ] ; Seizures[ ] ; Paresthesias[ ] ;Blurred vision [ ] ; Diplopia [ ] ; Vision changes [ ]   Ortho/Skin: Arthritis [ ] ; Joint pain [ ] ; Muscle pain [ ] ; Joint swelling [ ] ; Back Pain [ ] ; Rash [ ]   Psych: Depression[ ] ; Anxiety[ ]   Heme: Bleeding problems [ ] ; Clotting disorders [ ] ; Anemia [ ]   Endocrine: Diabetes [ ] ; Thyroid  dysfunction[ ]    Past Medical History:  Diagnosis Date   Anxiety 09/13/2015   Arthritis    Atypical chest pain 01/15/2021   [place] 10/24/2021   Bradycardia 07/17/2016   Formatting of this note might be different from the original.  Last Assessment & Plan:   Found to be bradycardic at outside hospital. They stopped metoprolol  and verapamil  and heart rate has been normal since then. Echo appears to have been reassuring at the outside facility. Appears to be doing much better with regards to this. She'll continue to monitor for recurrent symptoms. 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  Last Assessment & Plan:   Continues to have issues with this.  She is following with a specialist and it sounds as though they are planning on an MRI.  Benign exam today.   Chickenpox    Chronic kidney disease, stage 2 (mild)  04/08/2014   Dr. Rudean Corrente of this note might be different from the original.  Dr. Zelda Hickman      Last Assessment & Plan:   Recheck kidney function today.   Constipation 11/17/2019   Cough 01/25/2016   Formatting of this note might be different from the original.  Last Assessment & Plan:   Minimal nighttime cough. Improves when she washes her pillows consistently. Suspect allergies contributing. She'll monitor.   Diabetes mellitus without complication (HCC)    one elevated reading/ no treatment   Disorder of rotator cuff 06/24/2018   Diverticulitis     Dysuria 03/22/2017   Formatting of this note might be different from the original.  Last Assessment & Plan:   Symptoms concerning for UTI.  Will check urinalysis.   Edema of foot 04/08/2014   Formatting of this note might be different from the original.  Last Assessment & Plan:   Chronic pedal edema. Suspect venous insufficiency. No orthopnea or shortness of breath. Advised elevation of her legs. Consider compression stockings in the future.   Elevated troponin 12/15/2020   Essential hypertension 04/08/2014   Formatting of this note might be different from the original.  Last Assessment & Plan:   Well-controlled on recheck.  Continue current regimen.   Fall 03/26/2018   Gastroenteritis 08/12/2021   Gastrointestinal hemorrhage 08/03/2015   GI bleed    High cholesterol    History of blood transfusion    Hyperglycemia due to type 2 diabetes mellitus (HCC) 03/11/2015   Hypertension    Hypertensive urgency 12/15/2020   Low back pain 04/08/2014   Morbid obesity (HCC) 04/08/2014   Formatting of this note might be different from the original.  Last Assessment & Plan:   Weight is stable. Discussed diet and exercise at length. Encouraged whatever exercise she can do. Given diet information.   Palpitations 12/15/2020   Peripheral edema 12/22/2019   Postmenopausal bleeding 07/17/2016   Formatting of this note might be different from the original.  Last Assessment & Plan:   Recent D&C. Following with gynecology. Benign findings. Monitor for recurrence.   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  Last Assessment & Plan:   Continues to have issues with this.  She is following with a specialist and it sounds as though they are planning on an MRI.  Benign exam today.   Recurrent falls 08/13/2019   Renal insufficiency    Skin cyst 10/24/2021   Stage 3a  chronic kidney disease (HCC) 01/15/2021   Swelling of lower leg 07/19/2016   Vertigo 10/13/2015   Formatting of this note might be different from the original.  Last Assessment & Plan:   No recurrence. Discussed that meclizine  is an as needed medication and that she does not need to take this daily. She will continue to monitor.    Current Outpatient Medications  Medication Sig Dispense Refill   acetaminophen  (TYLENOL ) 500 MG tablet Take 500 mg by mouth every 6 (six) hours as needed for mild pain or headache.     Ascorbic Acid (VITAMIN C PO) Take 1 tablet by mouth daily.     aspirin  EC 81 MG tablet Take 81 mg by mouth 2 (two) times a week. Swallow whole.     benzonatate  (TESSALON ) 200 MG capsule Take 200 mg by mouth 3 (three) times daily as needed for  cough.     blood glucose meter kit and supplies KIT Accu chek, Dx code E11.65, check 3 times daily 1 each 0   Blood Pressure KIT 1 Device by Does not apply route daily. 1 kit 0   carvedilol  (COREG ) 6.25 MG tablet Take 1 tablet (6.25 mg total) by mouth 2 (two) times daily with a meal.     clopidogrel  (PLAVIX ) 75 MG tablet Take 1 tablet (75 mg total) by mouth daily for 21 days.     gabapentin  (NEURONTIN ) 300 MG capsule Take 300 mg by mouth at bedtime.     glucose blood (ACCU-CHEK GUIDE) test strip USE AS INSTRUCTED 3 TIMES DAILY 300 strip 12   guaiFENesin (SCOT-TUSSIN EXPECTORANT) 100 MG/5ML liquid Take 20 mLs by mouth 3 (three) times daily as needed for cough.     hydrALAZINE  (APRESOLINE ) 100 MG tablet Take 1 tablet (100 mg total) by mouth 3 (three) times daily. TAKE 1 TABLET (100 MG TOTAL) BY MOUTH 4 (FOUR) TIMES DAILY. Must keep upcoming appointment on 10/21/23 for future refills. (Patient taking differently: Take 100 mg by mouth in the morning, at noon, in the evening, and at bedtime. TAKE 1 TABLET (100 MG TOTAL) BY MOUTH 4 (FOUR) TIMES DAILY. Must keep upcoming appointment on 10/21/23 for future refills.)     insulin  NPH Human (NOVOLIN N) 100 UNIT/ML  injection Inject 15-20 Units into the skin 2 (two) times daily before a meal. Inject 20 units in the morning an 15 units in the evening.     insulin  regular (NOVOLIN R) 100 units/mL injection Inject 0-0.08 mLs (0-8 Units total) into the skin 3 (three) times daily before meals. 30 mL 11   Insulin  Syringe-Needle U-100 (BD INSULIN  SYRINGE U/F) 30G X 1/2" 0.5 ML MISC USE UP TO 5X PER DAY WITH INSULIN  2X (NPH) AND 3X (REGULAR) 500 each 12   ipratropium-albuterol  (DUONEB) 0.5-2.5 (3) MG/3ML SOLN Take 3 mLs by nebulization 4 (four) times daily. Take scheduled 4 times daily until wheezing and shortness of breath from RSV improves. Then take as needed.     LORazepam  (ATIVAN ) 1 MG tablet Take 0.5 tablets (0.5 mg total) by mouth daily as needed for anxiety. Inform pt 0.5 dose on backorder has to cut this in 1/2 dose 15 tablet 0   Multiple Vitamins-Minerals (CENTRUM SILVER 50+WOMEN) TABS Take 1 tablet by mouth daily with breakfast.     polyethylene glycol powder (GLYCOLAX /MIRALAX ) 17 GM/SCOOP powder Take 17 g by mouth daily as needed for moderate constipation or severe constipation. Can take up to 2x per day prn. Mix with 8 ounces of liquid 3350 g 11   rosuvastatin  (CRESTOR ) 20 MG tablet TAKE 1 TABLET BY MOUTH EVERY DAY 90 tablet 3   senna-docusate (SENOKOT-S) 8.6-50 MG tablet Take 1 tablet by mouth 2 (two) times daily.     spironolactone  (ALDACTONE ) 25 MG tablet Take 25 mg by mouth 3 (three) times a week. Monday Wednesday and friday     torsemide  (DEMADEX ) 10 MG tablet Take 2 tablets (20 mg total) by mouth daily.     traZODone  (DESYREL ) 50 MG tablet Take 1 tablet (50 mg total) by mouth at bedtime as needed for sleep.     No current facility-administered medications for this visit.    Allergies  Allergen Reactions   Celexa  [Citalopram ]     Diarrhea upset stomach    Jardiance  [Empagliflozin ] Other (See Comments)    Reaction not recalled   Norvasc  [Amlodipine ]     Leg edema  Tape Other (See Comments)     Leaves the skin "raw" if left on for a period of time- tolerates paper tape   Penicillin V Rash   Penicillin V Potassium Rash   Penicillins Rash    Has patient had a PCN reaction causing immediate rash, facial/tongue/throat swelling, SOB or lightheadedness with hypotension: Yes Has patient had a PCN reaction causing severe rash involving mucus membranes or skin necrosis: No Has patient had a PCN reaction that required hospitalization No Has patient had a PCN reaction occurring within the last 10 years: Yes If all of the above answers are "NO", then may proceed with Cephalosporin use.       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  Tobacco Use   Smoking status: Never   Smokeless tobacco: Never  Vaping Use   Vaping status: Never Used  Substance and Sexual Activity   Alcohol use: No   Drug use: No   Sexual activity: Not Currently  Other Topics Concern   Not on file  Social History Narrative   Lives alone    From [place]   Widowed - was married 3 times starting at age 77    hx of seamtress, 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   Social Drivers of Health   Financial Resource Strain: Medium Risk (10/03/2023)   Overall Financial Resource Strain (CARDIA)    Difficulty of Paying Living Expenses: Somewhat hard  Food Insecurity: No Food Insecurity (10/21/2023)   Hunger Vital Sign    Worried About Running Out of Food in the Last Year: Never true    Ran Out of Food in the Last Year: Never true  Recent Concern: Food Insecurity - Food Insecurity Present (10/03/2023)   Hunger Vital Sign    Worried About Running Out of Food in the Last Year: Sometimes true     Ran Out of Food in the Last Year: Never true  Transportation Needs: No Transportation Needs (10/21/2023)   PRAPARE - Administrator, Civil Service (Medical): No    Lack of Transportation (Non-Medical): No  Recent Concern: Transportation Needs - Unmet Transportation Needs (09/23/2023)   PRAPARE - Transportation    Lack of Transportation (Medical): Yes    Lack of Transportation (Non-Medical): Yes  Physical Activity: Inactive (10/03/2023)   Exercise Vital Sign    Days of Exercise per Week: 0 days    Minutes of Exercise per Session: 0 min  Stress: Stress Concern Present (11/29/2022)   [employer] of Occupational Health - Occupational Stress Questionnaire    Feeling of Stress : To some extent  Social Connections: Moderately Integrated (10/21/2023)   Social Connection and Isolation Panel [NHANES]    Frequency of Communication with Friends and Family: More than three times a week    Frequency of Social Gatherings with Friends and Family: More than three times a week    Attends Religious Services: More than 4 times per year    Active Member of [employer] or Organizations: Yes    Attends Banker Meetings: More than 4 times per year    Marital Status: Widowed  Intimate Partner Violence: Not At Risk (  10/21/2023)   Humiliation, Afraid, Rape, and Kick questionnaire    Fear of Current or Ex-Partner: No    Emotionally Abused: No    Physically Abused: No    Sexually Abused: No      Family History  Problem Relation Age of Onset   Diabetes Mother    Hypertension Mother    Stroke Mother    Diabetes Other    Healthy Father    Diabetes Sister    Heart disease Sister        PHYSICAL EXAM: General:  Well appearing. No respiratory difficulty HEENT: normal Neck: supple. no JVD. Carotids 2+ bilat; no bruits. No lymphadenopathy or thyromegaly appreciated. Cor: PMI nondisplaced. Regular rate & rhythm. No rubs, gallops or murmurs. Lungs: clear Abdomen: soft, nontender,  nondistended. No hepatosplenomegaly. No bruits or masses. Good bowel sounds. Extremities: no cyanosis, clubbing, rash, edema Neuro: alert & oriented x 3, cranial nerves grossly intact. moves all 4 extremities w/o difficulty. Affect pleasant.  ECG:   ASSESSMENT & PLAN:  1: Chronic heart failure with preserved ejection fraction- - suspect due to - NYHA class - euvolemic - weighing daily at SNF - Echo 09/30/23: EF 60-65% with moderate LVH, normal RV, G1DD, severely elevated PA pressure of 80.0 mmHg. - continue - saw cardiology Jerelene Monday) 01/24 - BNP 10/09/23 was 223.7  2: HTN- - BP - seeing PCP at SNF - BMET 10/15/23 reviewed: sodium 133, potassium 4.0, creatinine 2.01 & GFR 24  3: pHTN- - RHC 10/10/23: mild PAH, moderately reduced CO in setting of hypovolemia.  4: DM- - A1c 08/01/23 was 9.7% - saw endocrinology Shelvy Dickens) 07/24 - saw nephrology Zelda Hickman) 03/25  5: Monoclonal B-cell lymphoma- - saw oncology Wilhelmenia Harada) 01/25  6: PAD- - saw vascular Edgardo Goodwill) 08/24    Charlette Console, FNP 10/29/23

## 2023-10-30 ENCOUNTER — Encounter: Admitting: Family

## 2023-11-01 ENCOUNTER — Telehealth: Payer: Self-pay | Admitting: Podiatry

## 2023-11-01 NOTE — Telephone Encounter (Signed)
 Patient returned call. States she will be around the phone until the end of the day.

## 2023-11-01 NOTE — Telephone Encounter (Signed)
 Patient called back, states she will be around for the remainder of the day if you have time to contact her back.

## 2023-11-01 NOTE — Telephone Encounter (Signed)
 Returned patient's phone call. No answer. I left a voicemail with our office number.

## 2023-11-04 ENCOUNTER — Telehealth (INDEPENDENT_AMBULATORY_CARE_PROVIDER_SITE_OTHER): Payer: Self-pay | Admitting: Podiatry

## 2023-11-04 NOTE — Telephone Encounter (Signed)
 I returned patient's phone call. She wanted to let me know she has had four strokes and has been admitted to two hospitals. Now in rehab. Would like to cancel appointment with me for June and will call and reschedule when she is discharged home. She would like to be seen in [place] office when she does reschedule. I informed her we will be glad to get her rescheduled. I wished her well in her recovery.

## 2023-11-13 ENCOUNTER — Telehealth: Payer: Self-pay | Admitting: *Deleted

## 2023-11-13 ENCOUNTER — Encounter: Payer: Self-pay | Admitting: *Deleted

## 2023-11-14 ENCOUNTER — Telehealth: Payer: Self-pay | Admitting: Family

## 2023-11-14 NOTE — Progress Notes (Unsigned)
 [facility] Note   Referring Physician: recent admission PCP: Valli Gaw, MD Cardiologist: Belva Boyden, MD   Chief Complaint:    HPI:  Alexis Farmer is a 83 y/o female with a history of resistant hypertension, monoclonal B-cell lymphoma, chronic HFpEF, obesity, pHTN, type 2 diabetes and CKD stage IIIb.   Admitted 07/22/23 with RSV infection. Due to significant burden of wheezing was started on nebulizer treatments for reactive airway disease. Chronic LE edema, echo from 10/24 with EF around 60%. Pro-BNP currently 1900.    Admitted 09/29/23 with 2 week history of shortness of breath. Found with chest x-ray with findings suggestive of CHF. Elevated troponin likely related to demand ischemia from CHF and elevated BP. Underwent Right heart cath which showed low filling pressures and mild pulmonary arterial hypertension. Echo 09/30/23: EF 60-65% with moderate LVH, normal RV, G1DD, severely elevated PA pressure of 80.0 mmHg. RHC 10/10/23: mild PAH, moderately reduced CO in setting of hypovolemia. Underwent aggressive diuresis with IV Lasix  which was later on switched to oral torsemide  by cardiology. Entresto  held due to renal function. Discharged to SNF after 16 days.   She presents today for her initial HF visit with a chief complaint of    Review of Systems: [y] = yes, [ ]  = no   General: Weight gain [ ] ; Weight loss [ ] ; Anorexia [ ] ; Fatigue [ ] ; Fever [ ] ; Chills [ ] ; Weakness [ ]   Cardiac: Chest pain/pressure [ ] ; Resting SOB [ ] ; Exertional SOB [ ] ; Orthopnea [ ] ; Pedal Edema [ ] ; Palpitations [ ] ; Syncope [ ] ; Presyncope [ ] ; Paroxysmal nocturnal dyspnea[ ]   Pulmonary: Cough [ ] ; Wheezing[ ] ; Hemoptysis[ ] ; Sputum [ ] ; Snoring [ ]   GI: Vomiting[ ] ; Dysphagia[ ] ; Melena[ ] ; Hematochezia [ ] ; Heartburn[ ] ; Abdominal pain [ ] ; Constipation [ ] ; Diarrhea [ ] ; BRBPR [ ]   GU: Hematuria[ ] ; Dysuria [ ] ; Nocturia[ ]   Vascular: Pain in legs with walking [ ] ; Pain in feet with  lying flat [ ] ; Non-healing sores [ ] ; Stroke [ ] ; TIA [ ] ; Slurred speech [ ] ;  Neuro: Headaches[ ] ; Vertigo[ ] ; Seizures[ ] ; Paresthesias[ ] ;Blurred vision [ ] ; Diplopia [ ] ; Vision changes [ ]   Ortho/Skin: Arthritis [ ] ; Joint pain [ ] ; Muscle pain [ ] ; Joint swelling [ ] ; Back Pain [ ] ; Rash [ ]   Psych: Depression[ ] ; Anxiety[ ]   Heme: Bleeding problems [ ] ; Clotting disorders [ ] ; Anemia [ ]   Endocrine: Diabetes [ ] ; Thyroid  dysfunction[ ]    Past Medical History:  Diagnosis Date   Anxiety 09/13/2015   Arthritis    Atypical chest pain 01/15/2021   Blackhead 10/24/2021   Bradycardia 07/17/2016   Formatting of this note might be different from the original.  Last Assessment & Plan:   Found to be bradycardic at outside hospital. They stopped metoprolol  and verapamil  and heart rate has been normal since then. Echo appears to have been reassuring at the outside facility. Appears to be doing much better with regards to this. She'll continue to monitor for recurrent symptoms. 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  Last Assessment & Plan:   Continues to have issues with this.  She is following with a specialist and it sounds as though they are planning on an MRI.  Benign exam today.   Chickenpox    Chronic kidney disease, stage 2 (mild)  04/08/2014   Dr. Rudean Corrente of this note might be different from the original.  Dr. Zelda Farmer      Last Assessment & Plan:   Recheck kidney function today.   Constipation 11/17/2019   Cough 01/25/2016   Formatting of this note might be different from the original.  Last Assessment & Plan:   Minimal nighttime cough. Improves when she washes her pillows consistently. Suspect allergies contributing. She'll monitor.   Diabetes mellitus without complication (HCC)    one elevated reading/ no treatment   Disorder of rotator cuff 06/24/2018   Diverticulitis     Dysuria 03/22/2017   Formatting of this note might be different from the original.  Last Assessment & Plan:   Symptoms concerning for UTI.  Will check urinalysis.   Edema of foot 04/08/2014   Formatting of this note might be different from the original.  Last Assessment & Plan:   Chronic pedal edema. Suspect venous insufficiency. No orthopnea or shortness of breath. Advised elevation of her legs. Consider compression stockings in the future.   Elevated troponin 12/15/2020   Essential hypertension 04/08/2014   Formatting of this note might be different from the original.  Last Assessment & Plan:   Well-controlled on recheck.  Continue current regimen.   Fall 03/26/2018   Gastroenteritis 08/12/2021   Gastrointestinal hemorrhage 08/03/2015   GI bleed    High cholesterol    History of blood transfusion    Hyperglycemia due to type 2 diabetes mellitus (HCC) 03/11/2015   Hypertension    Hypertensive urgency 12/15/2020   Low back pain 04/08/2014   Morbid obesity (HCC) 04/08/2014   Formatting of this note might be different from the original.  Last Assessment & Plan:   Weight is stable. Discussed diet and exercise at length. Encouraged whatever exercise she can do. Given diet information.   Palpitations 12/15/2020   Peripheral edema 12/22/2019   Postmenopausal bleeding 07/17/2016   Formatting of this note might be different from the original.  Last Assessment & Plan:   Recent D&C. Following with gynecology. Benign findings. Monitor for recurrence.   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  Last Assessment & Plan:   Continues to have issues with this.  She is following with a specialist and it sounds as though they are planning on an MRI.  Benign exam today.   Recurrent falls 08/13/2019   Renal insufficiency    Skin cyst 10/24/2021   Stage 3a  chronic kidney disease (HCC) 01/15/2021   Swelling of lower leg 07/19/2016   Vertigo 10/13/2015   Formatting of this note might be different from the original.  Last Assessment & Plan:   No recurrence. Discussed that meclizine  is an as needed medication and that she does not need to take this daily. She will continue to monitor.    Current Outpatient Medications  Medication Sig Dispense Refill   acetaminophen  (TYLENOL ) 500 MG tablet Take 500 mg by mouth every 6 (six) hours as needed for mild pain or headache.     Ascorbic Acid (VITAMIN C PO) Take 1 tablet by mouth daily.     aspirin  EC 81 MG tablet Take 81 mg by mouth 2 (two) times a week. Swallow whole.     benzonatate  (TESSALON ) 200 MG capsule Take 200 mg by mouth 3 (three) times daily as needed for  cough.     blood glucose meter kit and supplies KIT Accu chek, Dx code E11.65, check 3 times daily 1 each 0   Blood Pressure KIT 1 Device by Does not apply route daily. 1 kit 0   carvedilol  (COREG ) 6.25 MG tablet Take 1 tablet (6.25 mg total) by mouth 2 (two) times daily with a meal.     clopidogrel  (PLAVIX ) 75 MG tablet Take 1 tablet (75 mg total) by mouth daily for 21 days.     gabapentin  (NEURONTIN ) 300 MG capsule Take 300 mg by mouth at bedtime.     glucose blood (ACCU-CHEK GUIDE) test strip USE AS INSTRUCTED 3 TIMES DAILY 300 strip 12   guaiFENesin (SCOT-TUSSIN EXPECTORANT) 100 MG/5ML liquid Take 20 mLs by mouth 3 (three) times daily as needed for cough.     hydrALAZINE  (APRESOLINE ) 100 MG tablet Take 1 tablet (100 mg total) by mouth 3 (three) times daily. TAKE 1 TABLET (100 MG TOTAL) BY MOUTH 4 (FOUR) TIMES DAILY. Must keep upcoming appointment on 10/21/23 for future refills. (Patient taking differently: Take 100 mg by mouth in the morning, at noon, in the evening, and at bedtime. TAKE 1 TABLET (100 MG TOTAL) BY MOUTH 4 (FOUR) TIMES DAILY. Must keep upcoming appointment on 10/21/23 for future refills.)     insulin  NPH Human (NOVOLIN N) 100 UNIT/ML  injection Inject 15-20 Units into the skin 2 (two) times daily before a meal. Inject 20 units in the morning an 15 units in the evening.     insulin  regular (NOVOLIN R) 100 units/mL injection Inject 0-0.08 mLs (0-8 Units total) into the skin 3 (three) times daily before meals. 30 mL 11   Insulin  Syringe-Needle U-100 (BD INSULIN  SYRINGE U/F) 30G X 1/2" 0.5 ML MISC USE UP TO 5X PER DAY WITH INSULIN  2X (NPH) AND 3X (REGULAR) 500 each 12   ipratropium-albuterol  (DUONEB) 0.5-2.5 (3) MG/3ML SOLN Take 3 mLs by nebulization 4 (four) times daily. Take scheduled 4 times daily until wheezing and shortness of breath from RSV improves. Then take as needed.     LORazepam  (ATIVAN ) 1 MG tablet Take 0.5 tablets (0.5 mg total) by mouth daily as needed for anxiety. Inform pt 0.5 dose on backorder has to cut this in 1/2 dose 15 tablet 0   Multiple Vitamins-Minerals (CENTRUM SILVER 50+WOMEN) TABS Take 1 tablet by mouth daily with breakfast.     polyethylene glycol powder (GLYCOLAX /MIRALAX ) 17 GM/SCOOP powder Take 17 g by mouth daily as needed for moderate constipation or severe constipation. Can take up to 2x per day prn. Mix with 8 ounces of liquid 3350 g 11   rosuvastatin  (CRESTOR ) 20 MG tablet TAKE 1 TABLET BY MOUTH EVERY DAY 90 tablet 3   senna-docusate (SENOKOT-S) 8.6-50 MG tablet Take 1 tablet by mouth 2 (two) times daily.     spironolactone  (ALDACTONE ) 25 MG tablet Take 25 mg by mouth 3 (three) times a week. Farmer Wednesday and friday     torsemide  (DEMADEX ) 10 MG tablet Take 2 tablets (20 mg total) by mouth daily.     traZODone  (DESYREL ) 50 MG tablet Take 1 tablet (50 mg total) by mouth at bedtime as needed for sleep.     No current facility-administered medications for this visit.    Allergies  Allergen Reactions   Celexa  [Citalopram ]     Diarrhea upset stomach    Jardiance  [Empagliflozin ] Other (See Comments)    Reaction not recalled   Norvasc  [Amlodipine ]     Leg edema  Tape Other (See Comments)     Leaves the skin "raw" if left on for a period of time- tolerates paper tape   Penicillin V Rash   Penicillin V Potassium Rash   Penicillins Rash    Has patient had a PCN reaction causing immediate rash, facial/tongue/throat swelling, SOB or lightheadedness with hypotension: Yes Has patient had a PCN reaction causing severe rash involving mucus membranes or skin necrosis: No Has patient had a PCN reaction that required hospitalization No Has patient had a PCN reaction occurring within the last 10 years: Yes If all of the above answers are "NO", then may proceed with Cephalosporin use.       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  Tobacco Use   Smoking status: Never   Smokeless tobacco: Never  Vaping Use   Vaping status: Never Used  Substance and Sexual Activity   Alcohol use: No   Drug use: No   Sexual activity: Not Currently  Other Topics Concern   Not on file  Social History Narrative   Lives alone    From [place]   Widowed - was married 3 times starting at age 60    hx of seamtress, 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   Social Drivers of Health   Financial Resource Strain: Medium Risk (10/03/2023)   Overall Financial Resource Strain (CARDIA)    Difficulty of Paying Living Expenses: Somewhat hard  Food Insecurity: No Food Insecurity (10/21/2023)   Hunger Vital Sign    Worried About Running Out of Food in the Last Year: Never true    Ran Out of Food in the Last Year: Never true  Recent Concern: Food Insecurity - Food Insecurity Present (10/03/2023)   Hunger Vital Sign    Worried About Running Out of Food in the Last Year: Sometimes true     Ran Out of Food in the Last Year: Never true  Transportation Needs: No Transportation Needs (10/21/2023)   PRAPARE - Administrator, Civil Service (Medical): No    Lack of Transportation (Non-Medical): No  Recent Concern: Transportation Needs - Unmet Transportation Needs (09/23/2023)   PRAPARE - Transportation    Lack of Transportation (Medical): Yes    Lack of Transportation (Non-Medical): Yes  Physical Activity: Inactive (10/03/2023)   Exercise Vital Sign    Days of Exercise per Week: 0 days    Minutes of Exercise per Session: 0 min  Stress: Stress Concern Present (11/29/2022)   [employer] of Occupational Health - Occupational Stress Questionnaire    Feeling of Stress : To some extent  Social Connections: Moderately Integrated (10/21/2023)   Social Connection and Isolation Panel [NHANES]    Frequency of Communication with Friends and Family: More than three times a week    Frequency of Social Gatherings with Friends and Family: More than three times a week    Attends Religious Services: More than 4 times per year    Active Member of [employer] or Organizations: Yes    Attends Banker Meetings: More than 4 times per year    Marital Status: Widowed  Intimate Partner Violence: Not At Risk (  10/21/2023)   Humiliation, Afraid, Rape, and Kick questionnaire    Fear of Current or Ex-Partner: No    Emotionally Abused: No    Physically Abused: No    Sexually Abused: No      Family History  Problem Relation Age of Onset   Diabetes Mother    Hypertension Mother    Stroke Mother    Diabetes Other    Healthy Father    Diabetes Sister    Heart disease Sister        PHYSICAL EXAM: General:  Well appearing. No respiratory difficulty HEENT: normal Neck: supple. no JVD. Carotids 2+ bilat; no bruits. No lymphadenopathy or thyromegaly appreciated. Cor: PMI nondisplaced. Regular rate & rhythm. No rubs, gallops or murmurs. Lungs: clear Abdomen: soft, nontender,  nondistended. No hepatosplenomegaly. No bruits or masses. Good bowel sounds. Extremities: no cyanosis, clubbing, rash, edema Neuro: alert & oriented x 3, cranial nerves grossly intact. moves all 4 extremities w/o difficulty. Affect pleasant.  ECG:   ASSESSMENT & PLAN:  1: Chronic heart failure with preserved ejection fraction- - suspect due to - NYHA class - euvolemic - weighing daily at SNF - Echo 09/30/23: EF 60-65% with moderate LVH, normal RV, G1DD, severely elevated PA pressure of 80.0 mmHg. - continue - saw cardiology Alexis Farmer) 01/24 - BNP 10/09/23 was 223.7  2: HTN- - BP - seeing PCP at SNF - BMET 10/15/23 reviewed: sodium 133, potassium 4.0, creatinine 2.01 & GFR 24  3: pHTN- - RHC 10/10/23: mild PAH, moderately reduced CO in setting of hypovolemia.  4: DM- - A1c 08/01/23 was 9.7% - saw endocrinology Alexis Farmer) 07/24 - saw nephrology Alexis Farmer) 03/25  5: Monoclonal B-cell lymphoma- - saw oncology Alexis Farmer) 01/25  6: PAD- - saw vascular Alexis Farmer) 08/24    Charlette Console, FNP 11/14/23

## 2023-11-14 NOTE — Telephone Encounter (Signed)
 Called to confirm/remind patient of their appointment at the [facility] on 11/15/23.   Appointment:   [x] Confirmed  [] Left mess   [] No answer/No voice mail  [] VM Full/unable to leave message  [] Phone not in service  Patient reminded to bring all medications and/or complete list.  Confirmed patient has transportation. Gave directions, instructed to utilize valet parking.

## 2023-11-15 ENCOUNTER — Ambulatory Visit: Attending: Family | Admitting: Family

## 2023-11-15 ENCOUNTER — Encounter: Payer: Self-pay | Admitting: Family

## 2023-11-15 VITALS — BP 166/58 | HR 73

## 2023-11-15 DIAGNOSIS — E1122 Type 2 diabetes mellitus with diabetic chronic kidney disease: Secondary | ICD-10-CM | POA: Diagnosis not present

## 2023-11-15 DIAGNOSIS — R5383 Other fatigue: Secondary | ICD-10-CM | POA: Diagnosis present

## 2023-11-15 DIAGNOSIS — Z7982 Long term (current) use of aspirin: Secondary | ICD-10-CM | POA: Insufficient documentation

## 2023-11-15 DIAGNOSIS — N1831 Chronic kidney disease, stage 3a: Secondary | ICD-10-CM | POA: Diagnosis not present

## 2023-11-15 DIAGNOSIS — I130 Hypertensive heart and chronic kidney disease with heart failure and stage 1 through stage 4 chronic kidney disease, or unspecified chronic kidney disease: Secondary | ICD-10-CM | POA: Diagnosis not present

## 2023-11-15 DIAGNOSIS — Z79899 Other long term (current) drug therapy: Secondary | ICD-10-CM | POA: Diagnosis not present

## 2023-11-15 DIAGNOSIS — I63212 Cerebral infarction due to unspecified occlusion or stenosis of left vertebral arteries: Secondary | ICD-10-CM

## 2023-11-15 DIAGNOSIS — I272 Pulmonary hypertension, unspecified: Secondary | ICD-10-CM

## 2023-11-15 DIAGNOSIS — I739 Peripheral vascular disease, unspecified: Secondary | ICD-10-CM

## 2023-11-15 DIAGNOSIS — I5032 Chronic diastolic (congestive) heart failure: Secondary | ICD-10-CM | POA: Diagnosis not present

## 2023-11-15 DIAGNOSIS — C8510 Unspecified B-cell lymphoma, unspecified site: Secondary | ICD-10-CM | POA: Insufficient documentation

## 2023-11-15 DIAGNOSIS — I10 Essential (primary) hypertension: Secondary | ICD-10-CM

## 2023-11-15 DIAGNOSIS — Z794 Long term (current) use of insulin: Secondary | ICD-10-CM | POA: Diagnosis not present

## 2023-11-15 DIAGNOSIS — E1151 Type 2 diabetes mellitus with diabetic peripheral angiopathy without gangrene: Secondary | ICD-10-CM | POA: Insufficient documentation

## 2023-11-15 DIAGNOSIS — J45909 Unspecified asthma, uncomplicated: Secondary | ICD-10-CM | POA: Insufficient documentation

## 2023-11-15 DIAGNOSIS — D72820 Lymphocytosis (symptomatic): Secondary | ICD-10-CM

## 2023-11-15 DIAGNOSIS — N1832 Chronic kidney disease, stage 3b: Secondary | ICD-10-CM

## 2023-11-15 DIAGNOSIS — Z5986 Financial insecurity: Secondary | ICD-10-CM | POA: Diagnosis not present

## 2023-11-15 MED ORDER — TORSEMIDE 20 MG PO TABS: 30.0000 mg | ORAL_TABLET | Freq: Every day | ORAL | 5 refills | Status: AC

## 2023-11-15 NOTE — Patient Instructions (Addendum)
 Medication Changes:  INCREASE TORSEMIDE  TO 30 MG ONCE DAILY   Lab Work:  Go DOWN to LOWER LEVEL (LL) to have your blood work completed inside of [employer] office.  We will only call you if the results are abnormal or if the provider would like to make medication changes.   REFERRALS:  We have placed a referral today to General Cardiology. They should reach out to you within 1-2 weeks. If they do not, please reach out to them. Their information is below on this AVS.   Follow-Up in: 2 MONTHS WITH ONE OF OUR DOCTORS.  Our Doctors' schedules are NOT open yet for 2 months. We will place you on our recall list. Once they are available, we will call you to schedule your follow up appointment.   At the [facility], you and your health needs are our priority. We have a designated team specialized in the treatment of Heart Failure. This Care Team includes your primary Heart Failure Specialized Cardiologist (physician), Advanced Practice Providers (APPs- Physician Assistants and Nurse Practitioners), and Pharmacist who all work together to provide you with the care you need, when you need it.   You may see any of the following providers on your designated Care Team at your next follow up:  Dr. Jules Oar Dr. [name] Dr. Alwin Baars Dr. Judyth Nunnery Shawnee Dellen, FNP Bevely Brush, RPH-CPP  Please be sure to bring in all your medications bottles to every appointment.   Need to Contact Us :  If you have any questions or concerns before your next appointment please send us  a message through [place] or call our office at [phone].    TO LEAVE A MESSAGE FOR THE NURSE SELECT OPTION 2, PLEASE LEAVE A MESSAGE INCLUDING: YOUR NAME DATE OF BIRTH CALL BACK NUMBER REASON FOR CALL**this is important as we prioritize the call backs  YOU WILL RECEIVE A CALL BACK THE SAME DAY AS LONG AS YOU CALL BEFORE 4:00 PM

## 2023-11-16 LAB — BASIC METABOLIC PANEL WITH GFR
BUN/Creatinine Ratio: 14 (ref 12–28)
BUN: 23 mg/dL (ref 8–27)
CO2: 23 mmol/L (ref 20–29)
Calcium: 9.9 mg/dL (ref 8.7–10.3)
Chloride: 94 mmol/L — ABNORMAL LOW (ref 96–106)
Creatinine, Ser: 1.65 mg/dL — ABNORMAL HIGH (ref 0.57–1.00)
Glucose: 265 mg/dL — ABNORMAL HIGH (ref 70–99)
Potassium: 5 mmol/L (ref 3.5–5.2)
Sodium: 134 mmol/L (ref 134–144)
eGFR: 30 mL/min/{1.73_m2} — ABNORMAL LOW (ref >59)

## 2023-11-17 ENCOUNTER — Ambulatory Visit: Payer: Self-pay | Admitting: Family

## 2023-11-19 ENCOUNTER — Telehealth: Payer: Self-pay

## 2023-11-19 NOTE — Telephone Encounter (Signed)
 Copied from CRM [id number]. Topic: Clinical - Prescription Issue >> Nov 19, 2023  3:06 PM Baldo Levan wrote: Reason for CRM: Arnetta Lank from [facility]  was calling in regards to the bed prescription. Arnetta Lank stated that it is  missing the patients demographics, it has to be on the order for the order to be valid. He stated he faxed over a new order if this can be filled out and faxed back to [phone].

## 2023-11-20 NOTE — Telephone Encounter (Signed)
 Called and spoke to [facility] and they are going to re-Fax the form as it never came though.

## 2023-11-21 NOTE — Telephone Encounter (Signed)
 Called and spoke to Alexis Farmer and he is faxing over the order form to be signed and filled out.

## 2023-11-21 NOTE — Telephone Encounter (Signed)
 Alexis Farmer is calling back in requesting a updated order for the bed

## 2023-11-25 ENCOUNTER — Telehealth: Payer: Self-pay | Admitting: Family Medicine

## 2023-11-25 ENCOUNTER — Encounter: Payer: Self-pay | Admitting: Cardiovascular Disease

## 2023-11-25 NOTE — Telephone Encounter (Signed)
 Copied from CRM [id number]. Topic: General - Other >> Nov 25, 2023  9:05 AM Essie A wrote: Reason for CRM: Arnetta Lank from [facility] called to see if his fax for patient has been received.  Please return his call at [phone].

## 2023-11-25 NOTE — Telephone Encounter (Signed)
 Left a message to Arnetta Lank to let him know that we have not received the fax as of 11/25/2023 @ 11:03 am.

## 2023-11-27 NOTE — Telephone Encounter (Signed)
 Have received the form.

## 2023-11-27 NOTE — Telephone Encounter (Signed)
 Copied from CRM [id number]. Topic: General - Other >> Nov 27, 2023  9:57 AM Luane Rumps D wrote: Reason for CRM: Alexis Farmer with [facility] calling because he has faxed over new order for hospital bed and confirming when updated version will be sent. Previous order was missing demographics.  fax #: [phone]

## 2023-11-27 NOTE — Telephone Encounter (Signed)
 Received form and placed in provider box to be signed.

## 2023-11-27 NOTE — Telephone Encounter (Signed)
 Form and demographics/insurance  has been faxed to the request number.

## 2023-11-28 ENCOUNTER — Ambulatory Visit: Admitting: Student

## 2023-11-28 ENCOUNTER — Ambulatory Visit: Attending: Student | Admitting: Student

## 2023-11-28 ENCOUNTER — Encounter: Payer: Self-pay | Admitting: Student

## 2023-11-28 VITALS — BP 145/85 | HR 78 | Ht 63.0 in | Wt 231.0 lb

## 2023-11-28 DIAGNOSIS — I10 Essential (primary) hypertension: Secondary | ICD-10-CM | POA: Diagnosis present

## 2023-11-28 DIAGNOSIS — E785 Hyperlipidemia, unspecified: Secondary | ICD-10-CM | POA: Diagnosis present

## 2023-11-28 DIAGNOSIS — I5032 Chronic diastolic (congestive) heart failure: Secondary | ICD-10-CM | POA: Diagnosis present

## 2023-11-28 DIAGNOSIS — I700 Atherosclerosis of aorta: Secondary | ICD-10-CM | POA: Diagnosis present

## 2023-11-28 DIAGNOSIS — I272 Pulmonary hypertension, unspecified: Secondary | ICD-10-CM | POA: Diagnosis present

## 2023-11-28 DIAGNOSIS — I63212 Cerebral infarction due to unspecified occlusion or stenosis of left vertebral arteries: Secondary | ICD-10-CM | POA: Diagnosis present

## 2023-11-28 DIAGNOSIS — I2510 Atherosclerotic heart disease of native coronary artery without angina pectoris: Secondary | ICD-10-CM | POA: Insufficient documentation

## 2023-11-28 NOTE — Progress Notes (Signed)
 Cardiology Clinic Note   Date: 11/28/2023 ID: Alexis Farmer, DOB 04-16-1941, MRN [id number]  Primary Cardiologist:  Belva Boyden, MD  Chief Complaint   Alexis Farmer is a 83 y.o. female who presents to the clinic today for routine follow up.   Patient Profile   Alexis Farmer is followed by Dr. Gollan for the history outlined below.      Past medical history significant for: Coronary artery calcification/aortic atherosclerosis. Chronic HFpEF/pulmonary hypertension. Echo 09/30/2023: EF 60 to 65%.  No RWMA.  Moderate LVH.  Grade I DD.  Normal RV size/function.  Severely elevated PA pressure, RVSP 80 mmHg.  Mild RAE.  Dilated IVC, RA pressure 15 mmHg. RHC 10/10/2023: Mild PAH.  Very low filling pressures after marked diuresis.  Moderately reduced CO in setting of hypovolemia. 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 Carotid duplex 10/21/2023: Bilateral ICA 1 to 39%.  Left subclavian artery was stenotic.  Normal flow hemodynamics seen in bilateral subclavian arteries. PAD. ABI right lower extremity 02/12/2023: Noncompressible of the right lower extremity arteries. Hypertension. Hyperlipidemia. Lipid panel 10/20/2023: LDL 62, HDL 47, TG 155, total 140. Lymphedema. CVA. T2DM. Hypothyroidism. CKD stage IIIb.  In summary, patient with chronic diastolic heart failure.  Echo January 2018 showed moderate concentric LVH, EF 62%, Grade I DD.  She has known bilateral carotid stenosis with right subclavian artery occlusion.  Ultrasound April 2022 showed bilateral ICA 1 to 39%, right subclavian steal.  She also has chronic PAD causing intermittent claudication.  Hospital admission in June 2022 for anxiety.  She subsequently was found to have elevated troponin felt to be secondary to elevated BP.  Echo at that time showed EF 60 to 65% with moderate concentric LVH.  CT chest showed coronary artery calcifications, aortic atherosclerosis, apparent origin of  right subclavian artery heavily calcified and mildly stenotic although patent.  Patient was evaluated by Dr. Gollan in the office on 07/10/2022 and noted to have elevated BP.  She refused medication changes at that time.  She underwent hospital admission in October 2020 for for generalized weakness and hyponatremia.  She was noted to be volume overloaded.  Echo at that time showed EF 60 to 65%, grade 1 DD.  She was diuresed with IV Lasix  and resumed on home torsemide  and spironolactone  upon discharge.  Patient was hospitalized twice in February 2025 for RSV and a fall at home with lower extremity pain.  She was seen in follow-up by PCP March 2025 and complained of orthopnea and lower extremity weakness.  No medication changes were made.  Patient underwent hospital admission April 2025 for shortness of breath and lower extremity edema.  She reported forgetting to take diuretics due to not feeling well from respiratory illness in February.  She was not weighing at home but felt she had likely gained weight.  She denies chest pain, palpitations, PND.  Upon arrival to ED BP 167/80.  Kidney function up from baseline at 1.83, BUN 27.  BNP elevated at 784.  Respiratory panel is negative.  Chest x-ray showed findings consistent with CHF.  EKG without acute changes.  Echo was done which showed EF 60 to 65%, moderate LVH, grade 1 DD, severely elevated PA pressure.  Patient was diuresed with IV Lasix  and admitted for further management.  She was started on GDMT with valsartan , spironolactone , Nebivolol , hydralazine .  She underwent RHC which showed mild PAH and low filling pressure after marked diuresis.  Patient was discharged on 10/15/2023.  Patient presented to the  ED from SNF on 10/19/2023 with numbness and weakness left lower extremity and abnormal gait.  MRI brain showed acute nonhemorrhagic cortical infarct in the medial right frontal lobe, 10 mm cortical infarct in the medial left parietal lobe, punctate cortical  infarcts in the right precentral gyrus, right frontal operculum, right middle frontal gyrus, late subacute infarct in the lateral right occipital lobe.  MRA head showed absence of flow related signal to the right vertebral artery suggesting occlusion or proximal high-grade stenosis, reconstitution intradurally, motion limited MRA of the neck with likely at least moderate stenosis of the proximal right ICA.  She was discharged on 10/24/2023 on aspirin  and Plavix  x 3 weeks then aspirin  alone.  Lower extremity Doppler showed no evidence of DVT.     History of Present Illness    Today, patient is accompanied by her granddaughter and great granddaughter. She is coming from SNF. She reports overall she is doing well. Patient denies shortness of breath, dyspnea on exertion,  orthopnea or PND. She has chronic lower extremity edema. It is typically at its best in the morning and progresses throughout the day. She sits with her legs in a dependent position much of the time. She has been working with physical therapy and has been up and walking a little bit with good tolerance. No chest pain or pressure. She gets a little bit of tightness under left breast when she is upset. This is not new for her. She has been somewhat frustrated at the nursing home secondary to not getting along with one of the night shift nurses. No palpitations.      ROS: All other systems reviewed and are otherwise negative except as noted in History of Present Illness.  EKGs/Labs Reviewed        10/19/2023: ALT 20; AST 34 11/15/2023: BUN 23; Creatinine, Ser 1.65; Potassium 5.0; Sodium 134   10/23/2023: Hemoglobin 10.8; WBC 7.4   10/19/2023: TSH 1.955   10/09/2023: B Natriuretic Peptide 223.7   Risk Assessment/Calculations      HYPERTENSION CONTROL Vitals:   11/28/23 1526 11/28/23 1600  BP: (!) 198/83 (!) 145/85    The patient's blood pressure is elevated above target today.  In order to address the patient's elevated BP: Blood  pressure will be monitored at home to determine if medication changes need to be made. (BP should be taken on right upper extremity per patient)           Physical Exam    VS:  BP (!) 145/85   Pulse 78   Ht 5' 3 (1.6 m)   Wt 231 lb (104.8 kg)   SpO2 96%   BMI 40.92 kg/m  , BMI Body mass index is 40.92 kg/m.  GEN: Well nourished, well developed, in no acute distress. Neck: No JVD or carotid bruits. Cardiac:  RRR.  No murmur. No rubs or gallops.   Respiratory:  Respirations regular and unlabored. Clear to auscultation without rales, wheezing or rhonchi. GI: Soft, nontender, nondistended. Extremities: Radials/DP/PT 2+ and equal bilaterally. No clubbing or cyanosis. 1+ pitting edema bilateral lower extremities.   Skin: Warm and dry, no rash. Neuro: Strength intact.  Assessment & Plan   Coronary artery calcification/aortic arthrosclerosis Patient denies chest pain or pressure. She experiences mild tightness under her left breast when she is upset. Granddaughter reports she stays upset. She is worried being in the nursing home particularly with one of the night nurses who she does not get along with.  - Continue aspirin , Plavix ,  rosuvastatin .   Chronic HFpEF/pulmonary hypertension Echo April 2025 showed normal LV/RV function, Grade I DD, severely elevated PA pressure, RVSP 80 mmHg, mild RAE.  RHC April 2025 showed mild PAH, very low filling parameters after marked diuresis, moderately reduced cardiac output in setting of hypovolemia.  Patient reports chronic lower extremity edema that is at its best at the morning and progresses throughout the day.  She sits with her legs in a dependent position most of the day.  She has been getting up and walking with PT a little bit at the nursing home.  She denies shortness of breath, DOE, orthopnea, PND.  1+ pitting edema bilateral lower extremities otherwise euvolemic and well compensated on exam. - Continue torsemide , spironolactone , carvedilol ,  hydralazine .  Entresto  was stopped secondary to kidney function. - Continue to follow with advanced heart failure clinic.  Hypertension BP today 145/84 right upper extremity. Left upper extremity BP 198/83 Patient states BP is always elevated in the left upper extremity.  Patient denies headaches or dizziness.  - Continue spironolactone , hydralazine , carvedilol .  Hyperlipidemia LDL 62 May 2025, at goal. - Continue rosuvastatin .  Recent CVA Hospital admission early May 2025 for numbness and weakness with abnormal gait. MRI brain showed acute nonhemorrhagic cortical infarct in the medial right frontal lobe, 10 mm cortical infarct in the medial left parietal lobe, punctate cortical infarcts in the right precentral gyrus, right frontal operculum, right middle frontal gyrus, late subacute infarct in the lateral right occipital lobe.  MRA head showed absence of flow related signal to the right vertebral artery suggesting occlusion or proximal high-grade stenosis, reconstitution intradurally, motion limited MRA of the neck with likely at least moderate stenosis of the proximal right ICA.Lower extremity US  negative for DVT. Grip strength equal bilaterally. Speech clear.  - Will contact granddaughter to make arrangements for 14 day Zio to be sent to her to place on patient at SNF.   Disposition: 14 day Zio. Return in 6 months or sooner as needed.          Signed, Lonell Rives. Aaryana Betke, DNP, NP-C

## 2023-11-28 NOTE — Patient Instructions (Signed)
 Medication Instructions:  Your physician recommends that you continue on your current medications as directed. Please refer to the Current Medication list given to you today.   *If you need a refill on your cardiac medications before your next appointment, please call your pharmacy*  Lab Work: None ordered at this time  If you have labs (blood work) drawn today and your tests are completely normal, you will receive your results only by: MyChart Message (if you have MyChart) OR A paper copy in the mail If you have any lab test that is abnormal or we need to change your treatment, we will call you to review the results.  Testing/Procedures: None ordered at this time   Follow-Up: At [facility], you and your health needs are our priority.  As part of our continuing mission to provide you with exceptional heart care, our providers are all part of one team.  This team includes your primary Cardiologist (physician) and Advanced Practice Providers or APPs (Physician Assistants and Nurse Practitioners) who all work together to provide you with the care you need, when you need it.  Your next appointment:   6 month(s)  Provider:   You may see Timothy Gollan, MD or one of the following Advanced Practice Providers on your designated Care Team:   Laneta Pintos, NP Gildardo Labrador, PA-C Varney Gentleman, PA-C Cadence Groves, PA-C Ronald Cockayne, NP Morey Ar, NP    We recommend signing up for the patient portal called MyChart.  Sign up information is provided on this After Visit Summary.  MyChart is used to connect with patients for Virtual Visits (Telemedicine).  Patients are able to view lab/test results, encounter notes, upcoming appointments, etc.  Non-urgent messages can be sent to your provider as well.   To learn more about what you can do with MyChart, go to [email or web address].

## 2023-11-29 ENCOUNTER — Telehealth: Payer: Self-pay | Admitting: Emergency Medicine

## 2023-11-29 NOTE — Telephone Encounter (Signed)
 Tried to call Karyn Pai, patient's granddaughter on her phone.  Message said that phone is not in service.  Called and left a message on the patient's phone for her or her granddaughter to call me back regarding willingness to try out the Zio monitor for the patient.

## 2023-11-29 NOTE — Telephone Encounter (Signed)
 Called and left a voicemail on [name] Youngblood (granddaughter) to call back and provide an address so that we are able to have the Zio monitor sent to her to be put on the patient at the skilled nursing facility.

## 2023-12-03 NOTE — Patient Outreach (Signed)
 Complex Care Management   Visit Note  12/03/2023  Name:  Alexis Farmer MRN: [id number] DOB: [birth year]  Situation: Successful contact made with patient. Patient states she is still in [facility] facility.  Patient offered ongoing follow up with new case manager, Gilberto Labella RN once discharged from [facility]. Patient verbally agreed to follow up with Gilberto Labella, RN at discharge.   Background:   Past Medical History:  Diagnosis Date   Anxiety 09/13/2015   Arthritis    Atypical chest pain 01/15/2021   Blackhead 10/24/2021   Bradycardia 07/17/2016   Formatting of this note might be different from the original.  Last Assessment & Plan:   Found to be bradycardic at outside hospital. They stopped metoprolol  and verapamil  and heart rate has been normal since then. Echo appears to have been reassuring at the outside facility. Appears to be doing much better with regards to this. She'll continue to monitor for recurrent symptoms. 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  Last Assessment & Plan:   Continues to have issues with this.  She is following with a specialist and it sounds as though they are planning on an MRI.  Benign exam today.   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 Rudean Corrente of this note might be different from the original.  Dr. Zelda Hickman      Last Assessment & Plan:   Recheck kidney function today.   Constipation 11/17/2019   Cough 01/25/2016   Formatting of this note might be different from the original.  Last Assessment & Plan:   Minimal nighttime cough. Improves when she washes her pillows consistently. Suspect allergies contributing. She'll monitor.   Diabetes mellitus without complication (HCC)    one elevated reading/ no treatment   Disorder of rotator cuff 06/24/2018   Diverticulitis    Dysuria  03/22/2017   Formatting of this note might be different from the original.  Last Assessment & Plan:   Symptoms concerning for UTI.  Will check urinalysis.   Edema of foot 04/08/2014   Formatting of this note might be different from the original.  Last Assessment & Plan:   Chronic pedal edema. Suspect venous insufficiency. No orthopnea or shortness of breath. Advised elevation of her legs. Consider compression stockings in the future.   Elevated troponin 12/15/2020   Essential hypertension 04/08/2014   Formatting of this note might be different from the original.  Last Assessment & Plan:   Well-controlled on recheck.  Continue current regimen.   Fall 03/26/2018   Gastroenteritis 08/12/2021   Gastrointestinal hemorrhage 08/03/2015   GI bleed    High cholesterol    History of blood transfusion    Hyperglycemia due to type 2 diabetes mellitus (HCC) 03/11/2015   Hypertension    Hypertensive urgency 12/15/2020   Low back pain 04/08/2014   Morbid obesity (HCC) 04/08/2014   Formatting of this note might be different from the original.  Last Assessment & Plan:   Weight is stable. Discussed diet and exercise at length. Encouraged whatever exercise she can do. Given diet information.   Palpitations 12/15/2020   Peripheral edema 12/22/2019   Postmenopausal bleeding 07/17/2016   Formatting of this note might be different from the original.  Last Assessment & Plan:   Recent D&C. Following with gynecology. Benign findings. Monitor for recurrence.  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  Last Assessment & Plan:   Continues to have issues with this.  She is following with a specialist and it sounds as though they are planning on an MRI.  Benign exam today.   Recurrent falls 08/13/2019   Renal insufficiency    Skin cyst 10/24/2021   Stage 3a chronic  kidney disease (HCC) 01/15/2021   Swelling of lower leg 07/19/2016   Vertigo 10/13/2015   Formatting of this note might be different from the original.  Last Assessment & Plan:   No recurrence. Discussed that meclizine  is an as needed medication and that she does not need to take this daily. She will continue to monitor.     Follow Up Plan:   Patient transferring to Gilberto Labella, RN once discharged from rehab facility for ongoing complex case management follow up.   Verba Girt RN, BSN, CCM [employer], Population Health Case Manager Phone: [phone]

## 2023-12-05 ENCOUNTER — Other Ambulatory Visit: Payer: Self-pay | Admitting: *Deleted

## 2023-12-05 DIAGNOSIS — I739 Peripheral vascular disease, unspecified: Secondary | ICD-10-CM

## 2023-12-09 ENCOUNTER — Other Ambulatory Visit: Payer: Self-pay | Admitting: Cardiovascular Disease

## 2023-12-09 DIAGNOSIS — R002 Palpitations: Secondary | ICD-10-CM

## 2023-12-09 DIAGNOSIS — E1159 Type 2 diabetes mellitus with other circulatory complications: Secondary | ICD-10-CM

## 2023-12-13 ENCOUNTER — Ambulatory Visit: Admitting: Podiatry

## 2023-12-14 ENCOUNTER — Other Ambulatory Visit: Payer: Self-pay | Admitting: Cardiovascular Disease

## 2023-12-14 DIAGNOSIS — I6523 Occlusion and stenosis of bilateral carotid arteries: Secondary | ICD-10-CM

## 2023-12-14 DIAGNOSIS — R002 Palpitations: Secondary | ICD-10-CM

## 2023-12-14 DIAGNOSIS — I10 Essential (primary) hypertension: Secondary | ICD-10-CM

## 2023-12-17 ENCOUNTER — Ambulatory Visit (HOSPITAL_COMMUNITY)

## 2023-12-17 NOTE — Progress Notes (Deleted)
 HISTORY AND PHYSICAL     CC:  follow up. Requesting Provider:  Venson Carrier, MD  HPI: This is a 83 y.o. female who is here today for follow up for PAD and previously followed by [facility] for BLE claudication and carotid artery stenosis.     Pt was seen 10/10/2021 and at that time, she was concerned about a right SCA occlusion that was asymptomatic.  She was not having any rest pain or ischemic ulceration of there RUE.  She did not have any dizziness or new neurological sx.  She was last seen 8/27/204 and she was not having any neurological sx and was not walking enough to claudicate.  She was not able to wear compression socks for her leg swelling. Medical therapy was recommended.    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  ***  The pt is on a statin for cholesterol management.    The pt is on an aspirin .    Other AC:  Plavix  The pt is on BB, diuretic for hypertension.  The pt is  on diabetic medication. Tobacco hx:  never    Past Medical History:  Diagnosis Date   Anxiety 09/13/2015   Arthritis    Atypical chest pain 01/15/2021   Blackhead 10/24/2021   Bradycardia 07/17/2016   Formatting of this note might be different from the original.  Last Assessment & Plan:   Found to be bradycardic at outside hospital. They stopped metoprolol  and verapamil  and heart rate has been normal since then. Echo appears to have been reassuring at the outside facility. Appears to be doing much better with regards to this. She'll continue to monitor for recurrent symptoms. 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  Last Assessment & Plan:   Continues to have issues with this.  She is following with a specialist and it sounds as though they are planning on an MRI.  Benign exam today.   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 Dennise Deal of this note might be different from the original.  Dr. Dennise      Last Assessment & Plan:   Recheck kidney function today.   Constipation 11/17/2019   Cough 01/25/2016   Formatting of this note might be different from the original.  Last Assessment & Plan:   Minimal nighttime cough. Improves when she washes her pillows consistently. Suspect allergies contributing. She'll monitor.   Diabetes mellitus without complication (HCC)    one elevated reading/ no treatment   Disorder of rotator cuff 06/24/2018   Diverticulitis    Dysuria 03/22/2017   Formatting of this note might be different from the original.  Last Assessment & Plan:   Symptoms concerning for UTI.  Will check urinalysis.   Edema of foot 04/08/2014   Formatting of this note might be different from the original.  Last Assessment & Plan:   Chronic pedal edema. Suspect venous insufficiency. No orthopnea or shortness of breath. Advised elevation of her legs. Consider compression stockings in the future.   Elevated troponin 12/15/2020   Essential hypertension 04/08/2014   Formatting of this note might be different from the original.  Last Assessment & Plan:   Well-controlled on recheck.  Continue current regimen.   Fall 03/26/2018   Gastroenteritis 08/12/2021  Gastrointestinal hemorrhage 08/03/2015   GI bleed    High cholesterol    History of blood transfusion    Hyperglycemia due to type 2 diabetes mellitus (HCC) 03/11/2015   Hypertension    Hypertensive urgency 12/15/2020   Low back pain 04/08/2014   Morbid obesity (HCC) 04/08/2014   Formatting of this note might be different from the original.  Last Assessment & Plan:   Weight is stable. Discussed diet and exercise at length. Encouraged whatever exercise she can do. Given diet information.   Palpitations 12/15/2020   Peripheral edema 12/22/2019   Postmenopausal bleeding 07/17/2016   Formatting of this note might be different from the original.  Last  Assessment & Plan:   Recent D&C. Following with gynecology. Benign findings. Monitor for recurrence.   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  Last Assessment & Plan:   Continues to have issues with this.  She is following with a specialist and it sounds as though they are planning on an MRI.  Benign exam today.   Recurrent falls 08/13/2019   Renal insufficiency    Skin cyst 10/24/2021   Stage 3a chronic kidney disease (HCC) 01/15/2021   Swelling of lower leg 07/19/2016   Vertigo 10/13/2015   Formatting of this note might be different from the original.  Last Assessment & Plan:   No recurrence. Discussed that meclizine  is an as needed medication and that she does not need to take this daily. She will continue to monitor.    Past Surgical History:  Procedure Laterality Date   APPENDECTOMY     CHOLECYSTECTOMY     ECTOPIC PREGNANCY SURGERY     EYE SURGERY     bilateral cataracts   EYE SURGERY     02/11/2019 repair hole in right eye    gallbladder      HYSTEROSCOPY WITH D & C N/A 10/26/2016   Procedure: DILATATION AND CURETTAGE /HYSTEROSCOPY;  Surgeon: Verdon Keen, MD;  Location: [facility];  Service: Gynecology;  Laterality: N/A;   HYSTEROSCOPY WITH D & C N/A 07/07/2018   Procedure: DILATATION AND CURETTAGE /HYSTEROSCOPY;  Surgeon: Verdon Keen, MD;  Location: [facility];  Service: Gynecology;  Laterality: N/A;   RIGHT HEART CATH Bilateral 10/10/2023   Procedure: RIGHT HEART CATH;  Surgeon: Cherrie Toribio SAUNDERS, MD;  Location: [facility];  Service: Cardiovascular;  Laterality: Bilateral;   THYROIDECTOMY, PARTIAL      Allergies  Allergen Reactions   Celexa  [Citalopram ]     Diarrhea upset stomach    Jardiance  [Empagliflozin ] Other (See Comments)    Reaction not recalled   Norvasc  [Amlodipine ]     Leg edema   Tape Other  (See Comments)    Leaves the skin raw if left on for a period of time- tolerates paper tape   Penicillin V Rash   Penicillin V Potassium Rash   Penicillins Rash    Has patient had a PCN reaction causing immediate rash, facial/tongue/throat swelling, SOB or lightheadedness with hypotension: Yes Has patient had a PCN reaction causing severe rash involving mucus membranes or skin necrosis: No Has patient had a PCN reaction that required hospitalization No Has patient had a PCN reaction occurring within the last 10 years: Yes If all of the above answers are NO, then may proceed with Cephalosporin use.     Current Outpatient Medications  Medication Sig Dispense  Refill   acetaminophen  (TYLENOL ) 500 MG tablet Take 500 mg by mouth every 6 (six) hours as needed for mild pain or headache.     Ascorbic Acid (VITAMIN C PO) Take 1 tablet by mouth daily.     aspirin  EC 81 MG tablet Take 81 mg by mouth 2 (two) times a week. Swallow whole.     benzonatate  (TESSALON ) 200 MG capsule Take 200 mg by mouth 3 (three) times daily as needed for cough.     blood glucose meter kit and supplies KIT Accu chek, Dx code E11.65, check 3 times daily (Patient not taking: Reported on 11/28/2023) 1 each 0   Blood Pressure KIT 1 Device by Does not apply route daily. (Patient not taking: Reported on 11/28/2023) 1 kit 0   carvedilol  (COREG ) 6.25 MG tablet Take 1 tablet (6.25 mg total) by mouth 2 (two) times daily with a meal.     clopidogrel  (PLAVIX ) 75 MG tablet Take 75 mg by mouth daily.     gabapentin  (NEURONTIN ) 100 MG capsule Take 100 mg by mouth 2 (two) times daily.     gabapentin  (NEURONTIN ) 300 MG capsule Take 300 mg by mouth at bedtime.     glucose blood (ACCU-CHEK GUIDE) test strip USE AS INSTRUCTED 3 TIMES DAILY 300 strip 12   guaiFENesin (SCOT-TUSSIN EXPECTORANT) 100 MG/5ML liquid Take 20 mLs by mouth 3 (three) times daily as needed for cough.     hydrALAZINE  (APRESOLINE ) 100 MG tablet TAKE 1 TABLET BY MOUTH 4  TIMES DAILY. MUST KEEP UPCOMING APPOINTMENT ON 10/21/23 FOR FUTURE REFILLS. 360 tablet 0   insulin  NPH Human (NOVOLIN N) 100 UNIT/ML injection Inject 15-20 Units into the skin 2 (two) times daily before a meal. Inject 20 units in the morning an 15 units in the evening.     insulin  regular (NOVOLIN R) 100 units/mL injection Inject 0-0.08 mLs (0-8 Units total) into the skin 3 (three) times daily before meals. 30 mL 11   Insulin  Syringe-Needle U-100 (BD INSULIN  SYRINGE U/F) 30G X 1/2 0.5 ML MISC USE UP TO 5X PER DAY WITH INSULIN  2X (NPH) AND 3X (REGULAR) 500 each 12   ipratropium-albuterol  (DUONEB) 0.5-2.5 (3) MG/3ML SOLN Take 3 mLs by nebulization 4 (four) times daily. Take scheduled 4 times daily until wheezing and shortness of breath from RSV improves. Then take as needed.     LORazepam  (ATIVAN ) 1 MG tablet Take 0.5 tablets (0.5 mg total) by mouth daily as needed for anxiety. Inform pt 0.5 dose on backorder has to cut this in 1/2 dose (Patient not taking: Reported on 11/28/2023) 15 tablet 0   Multiple Vitamins-Minerals (CENTRUM SILVER 50+WOMEN) TABS Take 1 tablet by mouth daily with breakfast.     polyethylene glycol powder (GLYCOLAX /MIRALAX ) 17 GM/SCOOP powder Take 17 g by mouth daily as needed for moderate constipation or severe constipation. Can take up to 2x per day prn. Mix with 8 ounces of liquid 3350 g 11   rosuvastatin  (CRESTOR ) 20 MG tablet TAKE 1 TABLET BY MOUTH EVERY DAY 90 tablet 3   senna-docusate (SENOKOT-S) 8.6-50 MG tablet Take 1 tablet by mouth 2 (two) times daily.     spironolactone  (ALDACTONE ) 25 MG tablet Take 25 mg by mouth 3 (three) times a week. Monday Wednesday and friday     torsemide  (DEMADEX ) 20 MG tablet Take 1.5 tablets (30 mg total) by mouth daily. 42 tablet 5   traZODone  (DESYREL ) 50 MG tablet Take 1 tablet (50 mg total) by mouth at bedtime  as needed for sleep. (Patient not taking: Reported on 11/28/2023)     No current facility-administered medications for this visit.     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  Tobacco Use   Smoking status: Never   Smokeless tobacco: Never  Vaping Use   Vaping status: Never Used  Substance and Sexual Activity   Alcohol use: No   Drug use: No   Sexual activity: Not Currently  Other Topics Concern   Not on file  Social History Narrative   Lives alone    From [place]   Widowed - was married 3 times starting at age 64    hx of seamtress, 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   Social Drivers of Health   Financial Resource Strain: Medium Risk (10/03/2023)   Overall Financial Resource Strain (CARDIA)    Difficulty of Paying Living Expenses: Somewhat hard  Food Insecurity: No Food Insecurity (10/21/2023)   Hunger Vital Sign    Worried About Running Out of Food in the Last Year: Never true    Ran Out of Food in the Last Year: Never true  Recent Concern: Food Insecurity - Food Insecurity Present (10/03/2023)   Hunger Vital Sign    Worried About Running Out of Food in the Last Year: Sometimes true    Ran Out of Food in the Last Year: Never true  Transportation Needs: No Transportation Needs (10/21/2023)   PRAPARE - Administrator, Civil Service (Medical): No    Lack of Transportation (Non-Medical): No  Recent Concern: Transportation Needs - Unmet Transportation Needs (09/23/2023)   PRAPARE - Transportation    Lack of Transportation (Medical): Yes    Lack of Transportation  (Non-Medical): Yes  Physical Activity: Inactive (10/03/2023)   Exercise Vital Sign    Days of Exercise per Week: 0 days    Minutes of Exercise per Session: 0 min  Stress: Stress Concern Present (11/29/2022)   [employer] of Occupational Health - Occupational Stress Questionnaire    Feeling of Stress : To some extent  Social Connections: Moderately Integrated (10/21/2023)   Social Connection and Isolation Panel    Frequency of Communication with Friends and Family: More than three times a week    Frequency of Social Gatherings with Friends and Family: More than three times a week    Attends Religious Services: More than 4 times per year    Active Member of [employer] or Organizations: Yes    Attends Banker Meetings: More than 4 times per year    Marital Status: Widowed  Intimate Partner Violence: Not At Risk (10/21/2023)   Humiliation, Afraid, Rape, and Kick questionnaire    Fear of Current or Ex-Partner: No    Emotionally Abused: No    Physically Abused: No    Sexually Abused: No     REVIEW OF SYSTEMS:  *** [X]   denotes positive finding, [ ]  denotes negative finding Cardiac  Comments:  Chest pain or chest pressure:    Shortness of breath upon exertion:    Short of breath when lying flat:    Irregular heart rhythm:        Vascular    Pain in calf, thigh, or hip brought on by ambulation:    Pain in feet at night that wakes you up from your sleep:     Blood clot in your veins:    Leg swelling:         Pulmonary    Oxygen at home:    Productive cough:     Wheezing:         Neurologic    Sudden weakness in arms or legs:     Sudden numbness in arms or legs:     Sudden onset of difficulty speaking or slurred speech:    Temporary loss of vision in one eye:     Problems with dizziness:         Gastrointestinal    Blood in stool:     Vomited blood:         Genitourinary    Burning when urinating:     Blood in urine:        Psychiatric    Major depression:          Hematologic    Bleeding problems:    Problems with blood clotting too easily:        Skin    Rashes or ulcers:        Constitutional    Fever or chills:      PHYSICAL EXAMINATION:  ***  General:  WDWN in NAD; vital signs documented above Gait: Not observed HENT: WNL, normocephalic Pulmonary: normal non-labored breathing , without wheezing Cardiac: {Desc; regular/irreg:14544} HR, {With/Without:20273} carotid bruit*** Abdomen: soft, NT; aortic pulse is *** palpable Skin: {With/Without:20273} rashes Vascular Exam/Pulses:  Right Left  Radial {Exam; arterial pulse strength 0-4:30167} {Exam; arterial pulse strength 0-4:30167}  Femoral {Exam; arterial pulse strength 0-4:30167} {Exam; arterial pulse strength 0-4:30167}  Popliteal {Exam; arterial pulse strength 0-4:30167} {Exam; arterial pulse strength 0-4:30167}  DP {Exam; arterial pulse strength 0-4:30167} {Exam; arterial pulse strength 0-4:30167}  PT {Exam; arterial pulse strength 0-4:30167} {Exam; arterial pulse strength 0-4:30167}  Peroneal *** ***   Extremities: {With/Without:20273} ischemic changes, {With/Without:20273} Gangrene , {With/Without:20273} cellulitis; {With/Without:20273} open wounds Musculoskeletal: no muscle wasting or atrophy  Neurologic: A&O X 3 Psychiatric:  The pt has {Desc; normal/abnormal:11317::Normal} affect.   Non-Invasive Vascular Imaging:   ABI's/TBI's on 12/17/2023: Right:  *** - Great toe pressure: *** Left:  *** - Great toe pressure: ***  Previous ABI's/TBI's on 02/12/2023: Right:  [place]/1.13 - Great toe pressure: 204 Left:  refused   Previous carotid duplex on 10/21/2023: Bilateral 1-39% ICA stenosis    ASSESSMENT/PLAN:: 83 y.o. female here for follow up for PAD with hx of claudication and mild carotid artery stenosis.    -*** -continue *** -discussed importance of increased walking daily -pt will f/u in *** with ***.   Lucie Apt, [facility] [phone]  Clinic MD:   Magda

## 2023-12-18 ENCOUNTER — Telehealth: Payer: Self-pay

## 2023-12-18 NOTE — Telephone Encounter (Signed)
 Copied from CRM [id number]. Topic: General - Call Back - No Documentation >> Dec 18, 2023 10:36 AM Shereese L wrote: Reason for CRM: patient is returning a call to RN and needs a call back for appt that was at 10am

## 2023-12-26 ENCOUNTER — Telehealth: Payer: Self-pay | Admitting: *Deleted

## 2023-12-27 ENCOUNTER — Telehealth: Payer: Self-pay

## 2023-12-27 ENCOUNTER — Telehealth: Payer: Self-pay | Admitting: Family Medicine

## 2023-12-27 DIAGNOSIS — E119 Type 2 diabetes mellitus without complications: Secondary | ICD-10-CM

## 2023-12-27 MED ORDER — ACCU-CHEK GUIDE TEST VI STRP: ORAL_STRIP | 2 refills | Status: DC

## 2023-12-27 NOTE — Telephone Encounter (Signed)
 Strips sent in for pt

## 2023-12-27 NOTE — Transitions of Care (Post Inpatient/ED Visit) (Signed)
   12/27/2023  Name: Alexis Farmer MRN: [id number] DOB: 02-Sep-1940  Today's TOC FU Call Status: Today's TOC FU Call Status:: Successful TOC FU Call Completed TOC FU Call Complete Date: 12/27/23 Patient's Name and Date of Birth confirmed.  Transition Care Management Follow-up Telephone Call Date of Discharge: 12/26/23 Discharge Facility: Other ([facility]) Name of Other ([facility]) Discharge Facility: [facility] Type of Discharge: Inpatient Admission Primary Inpatient Discharge Diagnosis:: cerebral infarction How have you been since you were released from the hospital?: Better Any questions or concerns?: No  Items Reviewed: Did you receive and understand the discharge instructions provided?: Yes Medications obtained,verified, and reconciled?: Yes (Medications Reviewed) Any new allergies since your discharge?: No Dietary orders reviewed?: Yes Do you have support at home?: Yes People in Home [RPT]: grandchild(ren)  Medications Reviewed Today: Medications Reviewed Today   Medications were not reviewed in this encounter     Home Care and Equipment/Supplies: Were Home Health Services Ordered?: Yes Name of Home Health Agency:: unknown Has Agency set up a time to come to your home?: No Any new equipment or medical supplies ordered?: Yes Name of Medical supply agency?: unknown Were you able to get the equipment/medical supplies?: Yes Do you have any questions related to the use of the equipment/supplies?: No  Functional Questionnaire: Do you need assistance with bathing/showering or dressing?: Yes Do you need assistance with meal preparation?: Yes Do you need assistance with eating?: No Do you have difficulty maintaining continence: No Do you need assistance with getting out of bed/getting out of a chair/moving?: No Do you have difficulty managing or taking your medications?: Yes  Follow up appointments reviewed: PCP Follow-up appointment confirmed?: Yes Date of PCP  follow-up appointment?: 01/06/24 Follow-up Provider: [facility] Follow-up appointment confirmed?: No Reason Specialist Follow-Up Not Confirmed: Patient has Specialist Provider Number and will Call for Appointment Do you need transportation to your follow-up appointment?: No Do you understand care options if your condition(s) worsen?: Yes-patient verbalized understanding    SIGNATURE Julian Lemmings, LPN [facility] Nurse Health Advisor Direct Dial [phone]

## 2023-12-27 NOTE — Telephone Encounter (Unsigned)
 Copied from CRM [id number]. Topic: Clinical - Medication Refill >> Dec 27, 2023 10:27 AM Revonda D wrote: Medication: glucose blood (ACCU-CHEK GUIDE) test strip  Has the patient contacted their pharmacy? No (Agent: If no, request that the patient contact the pharmacy for the refill. If patient does not wish to contact the pharmacy document the reason why and proceed with request.) (Agent: If yes, when and what did the pharmacy advise?)  This is the patient's preferred pharmacy:  [facility] [phone] [address] [place], [place] - [address] [place] [address] Phone: [phone] Fax: [phone]   Is this the correct pharmacy for this prescription? Yes If no, delete pharmacy and type the correct one.   Has the prescription been filled recently? No  Is the patient out of the medication? No  Has the patient been seen for an appointment in the last year OR does the patient have an upcoming appointment? Yes  Can we respond through MyChart? No  Agent: Please be advised that Rx refills may take up to 3 business days. We ask that you follow-up with your pharmacy.

## 2024-01-01 ENCOUNTER — Other Ambulatory Visit: Payer: Self-pay

## 2024-01-01 ENCOUNTER — Other Ambulatory Visit: Payer: Self-pay | Admitting: Internal Medicine

## 2024-01-01 ENCOUNTER — Other Ambulatory Visit: Payer: Self-pay | Admitting: Nurse Practitioner

## 2024-01-01 DIAGNOSIS — I5033 Acute on chronic diastolic (congestive) heart failure: Secondary | ICD-10-CM

## 2024-01-01 NOTE — Patient Outreach (Signed)
 Complex Care Management   Visit Note  01/01/2024  Name:  Alexis Farmer MRN: [id number] DOB: 12/02/1940  Situation: Referral received for Complex Care Management related to Heart Failure and Diabetes with Complications I obtained verbal consent from Patient.  Visit completed with patient  on the phone  Background:   Past Medical History:  Diagnosis Date   Anxiety 09/13/2015   Arthritis    Atypical chest pain 01/15/2021   Blackhead 10/24/2021   Bradycardia 07/17/2016   Formatting of this note might be different from the original.  Last Assessment & Plan:   Found to be bradycardic at outside hospital. They stopped metoprolol  and verapamil  and heart rate has been normal since then. Echo appears to have been reassuring at the outside facility. Appears to be doing much better with regards to this. She'll continue to monitor for recurrent symptoms. 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  Last Assessment & Plan:   Continues to have issues with this.  She is following with a specialist and it sounds as though they are planning on an MRI.  Benign exam today.   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 Dennise Deal of this note might be different from the original.  Dr. Dennise      Last Assessment & Plan:   Recheck kidney function today.   Constipation 11/17/2019   Cough 01/25/2016   Formatting of this note might be different from the original.  Last Assessment & Plan:   Minimal nighttime cough. Improves when she washes her pillows consistently. Suspect allergies contributing. She'll monitor.   Diabetes mellitus without complication (HCC)    one elevated reading/ no treatment   Disorder of rotator cuff 06/24/2018   Diverticulitis    Dysuria 03/22/2017   Formatting of this note might be different from the original.  Last Assessment & Plan:   Symptoms concerning for  UTI.  Will check urinalysis.   Edema of foot 04/08/2014   Formatting of this note might be different from the original.  Last Assessment & Plan:   Chronic pedal edema. Suspect venous insufficiency. No orthopnea or shortness of breath. Advised elevation of her legs. Consider compression stockings in the future.   Elevated troponin 12/15/2020   Essential hypertension 04/08/2014   Formatting of this note might be different from the original.  Last Assessment & Plan:   Well-controlled on recheck.  Continue current regimen.   Fall 03/26/2018   Gastroenteritis 08/12/2021   Gastrointestinal hemorrhage 08/03/2015   GI bleed    High cholesterol    History of blood transfusion    Hyperglycemia due to type 2 diabetes mellitus (HCC) 03/11/2015   Hypertension    Hypertensive urgency 12/15/2020   Low back pain 04/08/2014   Morbid obesity (HCC) 04/08/2014   Formatting of this note might be different from the original.  Last Assessment & Plan:   Weight is stable. Discussed diet and exercise at length. Encouraged whatever exercise she can do. Given diet information.   Palpitations 12/15/2020   Peripheral edema 12/22/2019   Postmenopausal bleeding 07/17/2016   Formatting of this note might be different from the original.  Last Assessment & Plan:   Recent D&C. Following with gynecology. Benign findings. Monitor for recurrence.   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  Last Assessment & Plan:   Continues to have issues with this.  She is following with a specialist and it sounds as though they are planning on an MRI.  Benign exam today.   Recurrent falls 08/13/2019   Renal insufficiency    Skin cyst 10/24/2021   Stage 3a chronic kidney disease (HCC) 01/15/2021   Swelling of lower leg 07/19/2016   Vertigo 10/13/2015   Formatting of this note might be  different from the original.  Last Assessment & Plan:   No recurrence. Discussed that meclizine  is an as needed medication and that she does not need to take this daily. She will continue to monitor.    Assessment: Patient Reported Symptoms:  Cognitive Cognitive Status: Alert and oriented to person, place, and time, Normal speech and language skills, Insightful and able to interpret abstract concepts, Struggling with memory recall (Some memory issues but do not impact daily life)   Health Maintenance Behaviors: Annual physical exam, Healthy diet, Sleep adequate (states does not eat salt,) Healing Pattern: Slow Health Facilitated by: Healthy diet, Rest  Neurological Neurological Review of Symptoms: No symptoms reported Neurological Comment: ongoing dizziness off and on but not current  HEENT HEENT Symptoms Reported: Tinnitus HEENT Management Strategies: Routine screening HEENT Comment: tinnitus improved, gets louder with low BG    Cardiovascular Cardiovascular Symptoms Reported: Swelling in legs or feet (left foot swelling > right) Does patient have uncontrolled Hypertension?: No Cardiovascular Management Strategies: Medication therapy, Routine screening, Diet modification  Respiratory Respiratory Symptoms Reported: No symptoms reported Additional Respiratory Details: had cough in rehab - resolved Respiratory Management Strategies: Routine screening  Endocrine Endocrine Symptoms Reported: Hyperglycemia, Irritability, Shakiness, Hypoglycemia Is patient diabetic?: Yes Is patient checking blood sugars at home?: Yes List most recent blood sugar readings, include date and time of day: FBG 258 low 90 high 250's Endocrine Self-Management Outcome: 3 (uncertain)  Gastrointestinal Gastrointestinal Symptoms Reported: Constipation Additional Gastrointestinal Details: Managing with Miralax  and Senna S Gastrointestinal Management Strategies: Medication therapy    Genitourinary Genitourinary Symptoms  Reported: Incontinence Additional Genitourinary Details: pull ups for incontinence Genitourinary Management Strategies: Medication therapy, Incontinence garment/pad  Integumentary Integumentary Symptoms Reported: No symptoms reported Skin Management Strategies: Routine screening  Musculoskeletal Musculoskelatal Symptoms Reviewed: Difficulty walking, Limited mobility, Joint pain, Muscle pain Additional Musculoskeletal Details: Left leg weakness and limited moblity, neuropathy in hands and feet, hurts all over Musculoskeletal Management Strategies: Medication therapy Falls in the past year?: Yes Number of falls in past year: 2 or more (falls related to legs giving out/weak) Was there an injury with Fall?: No Fall Risk Category Calculator: 2 Patient Fall Risk Level: Moderate Fall Risk Patient at Risk for Falls Due to: History of fall(s), Impaired balance/gait, Impaired mobility, Impaired vision Fall risk Follow up: Falls evaluation completed, Falls prevention discussed  Psychosocial Psychosocial Symptoms Reported: Depression - if selected complete PHQ 2-9, Anxiety - if selected complete GAD Additional Psychological Details: Will speak with LCSW tomorro at 3 pm Behavioral Management Strategies: Support system Major Change/Loss/Stressor/Fears (CP): Medical condition, self Techniques to Cope with Loss/Stress/Change: Diversional activities Quality of Family Relationships: supportive Do you feel physically threatened by others?: No      01/01/2024   11:46 AM  Depression screen PHQ 2/9  Decreased Interest 3  Down, Depressed, Hopeless 3  PHQ - 2 Score 6  Altered sleeping 0  Tired, decreased energy 3  Change in appetite 1  Feeling bad or failure about yourself  0  Trouble concentrating 0  Moving slowly or fidgety/restless 0  Suicidal thoughts 0  PHQ-9 Score 10  Difficult doing work/chores Somewhat difficult    Vitals:   01/01/24 1132  BP: 137/68    Medications Reviewed Today      Reviewed by Devra Lands, RN (Registered Nurse) on 01/01/24 at 1118  Med List Status: <None>   Medication Order Taking? Sig Documenting Provider Last Dose Status Informant  acetaminophen  (TYLENOL ) 500 MG tablet [id number] Yes Take 500 mg by mouth every 6 (six) hours as needed for mild pain or headache. [provider]  Active Nursing Home Medication Administration Guide (MAG), Pharmacy Records  Ascorbic Acid (VITAMIN C PO) [id number] Yes Take 1 tablet by mouth daily. [provider]  Active Nursing Home Medication Administration Guide (MAG), Pharmacy Records  aspirin  EC 81 MG tablet [id number] Yes Take 81 mg by mouth 2 (two) times a week. Swallow whole. [provider]  Active Nursing Home Medication Administration Guide [facility]), Pharmacy Records           Med Note LYELL, REENA KANDICE Debar Jul 10, 2022 10:42 AM)    benzonatate  (TESSALON ) 200 MG capsule [id number] Yes Take 200 mg by mouth 3 (three) times daily as needed for cough. [provider]  Active Nursing Home Medication Administration Guide (MAG), Pharmacy Records  blood glucose meter kit and supplies KIT [id number] Yes Accu chek, Dx code E11.65, check 3 times daily Maribeth Camellia KANDICE, MD  Active Nursing Home Medication Administration Guide (MAG), Pharmacy Records  Blood Pressure KIT [id number] Yes 1 Device by Does not apply route daily. McLean-Scocuzza, Randine SAILOR, MD  Active Nursing Home Medication Administration Guide (MAG), Pharmacy Records  carvedilol  (COREG ) 6.25 MG tablet [id number] Yes Take 1 tablet (6.25 mg total) by mouth 2 (two) times daily with a meal. Djan, Drue DASEN, MD  Active Nursing Home Medication Administration Guide (MAG), Pharmacy Records  clopidogrel  (PLAVIX ) 75 MG tablet [id number] Yes Take 75 mg by mouth daily. [provider]  Active   gabapentin  (NEURONTIN ) 100 MG capsule [id number] Yes Take 100 mg by mouth 2 (two) times daily. [provider]  Active   gabapentin  (NEURONTIN )  300 MG capsule [id number] Yes Take 300 mg by mouth at bedtime. [provider]  Active Nursing Home Medication Administration Guide (MAG), Pharmacy Records  glucose blood (ACCU-CHEK GUIDE TEST) test strip [id number] Yes Use as instructed 3 times daily Marylynn Verneita CROME, MD  Active   glucose blood (ACCU-CHEK GUIDE) test strip [id number] Yes USE AS INSTRUCTED 3 TIMES DAILY Hope Merle, MD  Active Nursing Home Medication Administration Guide (MAG), Pharmacy Records  guaiFENesin (SCOT-TUSSIN EXPECTORANT) 100 MG/5ML liquid [id number] Yes Take 20 mLs by mouth 3 (three) times daily as needed for cough. [provider]  Active Nursing Home Medication Administration Guide (MAG), Pharmacy Records  hydrALAZINE  (APRESOLINE ) 100 MG tablet [id number] Yes TAKE 1 TABLET BY MOUTH 4 TIMES DAILY. MUST KEEP UPCOMING APPOINTMENT ON 10/21/23 FOR FUTURE REFILLS.  Patient taking differently: Patient reports taking 2x day after Rehab   Dorinda Drue DASEN, MD  Active   insulin  NPH Human (NOVOLIN N) 100 UNIT/ML injection [id number] Yes Inject 15-20 Units into the skin 2 (two) times daily before a meal. Inject 20 units in the morning an 15 units in the evening. [provider]  Active Nursing Home Medication Administration Guide (MAG), Pharmacy Records  insulin  regular (NOVOLIN R) 100 units/mL injection [id number] Yes Inject 0-0.08 mLs (0-8 Units total) into the skin  3 (three) times daily before meals. Kassie Mallick, MD  Active Nursing Home Medication Administration Guide (MAG), Pharmacy Records  Insulin  Syringe-Needle U-100 (BD INSULIN  SYRINGE U/F) 30G X 1/2 0.5 ML MISC [id number] Yes USE UP TO 5X PER DAY WITH INSULIN  2X (NPH) AND 3X (REGULAR) Hope Merle, MD  Active Nursing Home Medication Administration Guide (MAG), Pharmacy Records  ipratropium-albuterol  (DUONEB) 0.5-2.5 (3) MG/3ML SOLN [id number]  Take 3 mLs by nebulization 4 (four) times daily. Take scheduled 4 times daily until wheezing and shortness of breath  from RSV improves. Then take as needed.  Patient not taking: Reported on 01/01/2024   [provider]  Active Pharmacy Records, Nursing Home Medication Administration Guide (MAG)           Med Note BEVERLEE, MERLYNN   Sun Sep 29, 2023 10:37 PM) Pt states worsens symptoms  LORazepam  (ATIVAN ) 1 MG tablet [id number]  Take 0.5 tablets (0.5 mg total) by mouth daily as needed for anxiety. Inform pt 0.5 dose on backorder has to cut this in 1/2 dose  Patient not taking: Reported on 12/27/2023   Hope Merle, MD  Active Nursing Home Medication Administration Guide (MAG), Pharmacy Records  Multiple Vitamins-Minerals (CENTRUM SILVER 50+WOMEN) TABS [id number] Yes Take 1 tablet by mouth daily with breakfast. [provider]  Active Nursing Home Medication Administration Guide (MAG), Pharmacy Records  polyethylene glycol powder (GLYCOLAX /MIRALAX ) 17 GM/SCOOP powder [id number] Yes Take 17 g by mouth daily as needed for moderate constipation or severe constipation. Can take up to 2x per day prn. Mix with 8 ounces of liquid McLean-Scocuzza, Randine SAILOR, MD  Active Nursing Home Medication Administration Guide (MAG), Pharmacy Records  rosuvastatin  (CRESTOR ) 20 MG tablet [id number] Yes TAKE 1 TABLET BY MOUTH EVERY DAY McLean-Scocuzza, Randine SAILOR, MD  Active Nursing Home Medication Administration Guide (MAG), Pharmacy Records           Med Note GROVER BURNARD GORMAN Pablo Apr 01, 2023  1:08 PM)    senna-docusate (SENOKOT-S) 8.6-50 MG tablet [id number] Yes Take 1 tablet by mouth 2 (two) times daily. Dorinda Drue DASEN, MD  Active Nursing Home Medication Administration Guide (MAG), Pharmacy Records  spironolactone  (ALDACTONE ) 25 MG tablet [id number] Yes Take 25 mg by mouth 3 (three) times a week. Monday Wednesday and friday  Patient taking differently: Take 25 mg by mouth 3 (three) times a week. Patient reports change, Wednesday and Saturday   [provider]  Active Nursing Home Medication Administration Guide [facility]),  Pharmacy Records           Med Note BEVERLEE MERLYNN Repress Sep 29, 2023 10:37 PM) Taking Mondays, Wednesdays, and fridays  torsemide  (DEMADEX ) 20 MG tablet [id number] Yes Take 1.5 tablets (30 mg total) by mouth daily. [place] A, FNP  Active   traZODone  (DESYREL ) 50 MG tablet [id number]  Take 1 tablet (50 mg total) by mouth at bedtime as needed for sleep.  Patient not taking: Reported on 12/27/2023   Dorinda Drue DASEN, MD  Active Nursing Home Medication Administration Guide (MAG), Pharmacy Records  Med List Note Reinhold, Recardo HERO, CPhT 10/19/23 1104): [facility]            Recommendation:   PCP Follow-up Continue Current Plan of Care  Follow Up Plan:   Telephone follow-up 2 Sujay Grundman  Nestora Duos, MSN, RN [facility]  [facility] [place], [facility] RN Care Manager Direct Dial: [phone] Fax: [phone]

## 2024-01-01 NOTE — Patient Instructions (Signed)
 Visit Information  Thank you for taking time to visit with me today. Please don't hesitate to contact me if I can be of assistance to you before our next scheduled appointment.  Our next appointment is by telephone on 01/15/2024 at 3:30 pm Please call the care guide team at [phone] if you need to cancel or reschedule your appointment.   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  Goal: Over the next 90 days the Patient will attend all scheduled medical appointments: PCP  as evidenced by no missed appointments        continue to work with RN Care Manager and/or Social Worker to address care management and care coordination needs related to DMII as evidenced by adherence to care management team scheduled appointments     take all medications exactly as prescribed and will call provider for medication related questions as evidenced by Verbalizing compliance with all medications    verbalize basic understanding of DMII disease process and self health management plan as evidenced by Verbalizing when to call PCP and red flags for ED, treatment of hypoglycemia, lifestyle modifications including diet, exercise, checking FBG and before meals as prescribed  Interventions:   Diabetes Interventions: Assessed patient's understanding of A1c goal: <7% Reviewed medications with patient and discussed importance of medication adherence Provided patient with written educational materials related to hypo and hyperglycemia and importance of correct treatment Reviewed scheduled/upcoming provider appointments including: PCP 01/06/24 and Endocrine 03/05/24 and Nephrology 01/27/24 Review of patient status, including review of consultants reports, relevant laboratory and other test results, and medications completed Screening for signs and symptoms  of depression related to chronic disease state  Assessed social determinant of health barriers Lab Results  Component Value Date   HGBA1C 8.7 (H) 10/19/2023    Patient Self-Care Activities:  Attend all scheduled provider appointments Call pharmacy for medication refills 3-7 days in advance of running out of medications Call provider office for new concerns or questions  Take medications as prescribed   check blood sugar at prescribed times: FBG and before meals as prescribed and as needed if symptoms of high/low BG check feet daily for cuts, sores or redness enter blood sugar readings and medication or insulin  into daily log take the blood sugar log to all doctor visits manage portion size keep feet up while sitting wash and dry feet carefully every day wear comfortable, cotton socks wear comfortable, well-fitting shoes  Plan:  Telephone follow up appointment with care management team member scheduled for:  01/15/2024 at 3:30 pm          VBCI RN Care Plan - HF       Problems:  Knowledge Deficits related to CHF  Goal: Over the next 90 days the Patient will attend all scheduled medical appointments: PCP as evidenced by No misses appointments        continue to work with RN Care Manager and/or Social Worker to address care management and care coordination needs related to CHF as evidenced by adherence to care management team scheduled appointments     take all medications exactly as prescribed and will call provider for medication related questions as evidenced by Verbalizing compliance with all medications    verbalize basic understanding  of CHF disease process and self health management plan as evidenced by Verbalizing understanding of CHF Action Plan reviewed and provided via mail, symptoms to notify PCP and red flags or ED, lifestyle modifications including diet, exercise  Interventions:   Heart Failure Interventions: Reviewed Heart Failure Action Plan in depth and provided  written copy Assessed need for readable accurate scales in home: Patient does not have a scale, request sent to provider to order through Medicare  Provided education about placing scale on hard, flat surface Discussed locating scale where she can safely step up with support to prevent falls  Discussed importance of daily weight and advised patient to weigh and record daily Reviewed role of diuretics in prevention of fluid overload and management of heart failure; Discussed the importance of keeping all appointments with provider Screening for signs and symptoms of depression related to chronic disease state Patient meeting with LCSW 01/02/24 Assessed social determinant of health barriers Patient denies needs at this time  Patient Self-Care Activities:  Attend all scheduled provider appointments Call pharmacy for medication refills 3-7 days in advance of running out of medications Call provider office for new concerns or questions  Take medications as prescribed   call office if I gain more than 2 pounds in one day or 5 pounds in one week do ankle pumps when sitting keep legs up while sitting track weight in diary watch for swelling in feet, ankles and legs every day weigh myself daily begin a heart failure diary bring diary to all appointments eat more whole grains, fruits and vegetables, lean meats and healthy fats know when to call the doctor:Following CHF Action Plan track symptoms and what helps feel better or worse  Plan:  Telephone follow up appointment with care management team member scheduled for:  01/15/2024 at 10:30 am             Please call the Suicide and Crisis Lifeline: 988 call the [place]  National Suicide Prevention Lifeline: 413-439-2777 or TTY: 2797916533 TTY (727)244-6902) to talk to a trained counselor call 1-800-273-TALK (toll free, 24 hour hotline) go to [facility] [address], [place] [phone]) call 911  if you are experiencing a Mental Health or Behavioral Health Crisis or need someone to talk to.  The patient verbalized understanding of instructions, educational materials, and care plan provided today and agreed to receive a mailed copy of patient instructions, educational materials, and care plan.   Nestora Duos, MSN, RN [place]  [facility], [facility] RN Care Manager Direct Dial: [phone] Fax: [phone]   Fall Prevention in the Home, Adult Falls can cause injuries and affect people of all ages. There are many simple things that you can do to make your home safe and to help prevent falls. If you need it, ask for help making these changes. What actions can I take to prevent falls? General information Use good lighting in all rooms. Make sure to: Replace any light bulbs that burn out. Turn on lights if it is dark and use night-lights. Keep items that you use often in easy-to-reach places. Lower the shelves around your home if needed. Move furniture so that there are clear paths around it. Do not keep throw rugs or other things on the floor that can make you trip. If any of your floors are uneven, fix them. Add color or contrast paint or tape to clearly mark and help you see: Grab bars or handrails. First and last steps of staircases.  Where the edge of each step is. If you use a ladder or stepladder: Make sure that it is fully opened. Do not climb a closed ladder. Make sure the sides of the ladder are locked in place. Have someone hold the ladder while you use it. Know where your pets are as you move through your home. What can I do in the bathroom?     Keep the floor dry. Clean up any water that is on the floor right away. Remove soap buildup in the bathtub or shower. Buildup makes bathtubs and showers slippery. Use non-skid mats or decals on the floor of the bathtub or shower. Attach bath mats securely with double-sided, non-slip rug  tape. If you need to sit down while you are in the shower, use a non-slip stool. Install grab bars by the toilet and in the bathtub and shower. Do not use towel bars as grab bars. What can I do in the bedroom? Make sure that you have a light by your bed that is easy to reach. Do not use any sheets or blankets on your bed that hang to the floor. Have a firm bench or chair with side arms that you can use for support when you get dressed. What can I do in the kitchen? Clean up any spills right away. If you need to reach something above you, use a sturdy step stool that has a grab bar. Keep electrical cables out of the way. Do not use floor polish or wax that makes floors slippery. What can I do with my stairs? Do not leave anything on the stairs. Make sure that you have a light switch at the top and the bottom of the stairs. Have them installed if you do not have them. Make sure that there are handrails on both sides of the stairs. Fix handrails that are broken or loose. Make sure that handrails are as long as the staircases. Install non-slip stair treads on all stairs in your home if they do not have carpet. Avoid having throw rugs at the top or bottom of stairs, or secure the rugs with carpet tape to prevent them from moving. Choose a carpet design that does not hide the edge of steps on the stairs. Make sure that carpet is firmly attached to the stairs. Fix any carpet that is loose or worn. What can I do on the outside of my home? Use bright outdoor lighting. Repair the edges of walkways and driveways and fix any cracks. Clear paths of anything that can make you trip, such as tools or rocks. Add color or contrast paint or tape to clearly mark and help you see high doorway thresholds. Trim any bushes or trees on the main path into your home. Check that handrails are securely fastened and in good repair. Both sides of all steps should have handrails. Install guardrails along the edges of any  raised decks or porches. Have leaves, snow, and ice cleared regularly. Use sand, salt, or ice melt on walkways during winter months if you live where there is ice and snow. In the garage, clean up any spills right away, including grease or oil spills. What other actions can I take? Review your medicines with your health care provider. Some medicines can make you confused or feel dizzy. This can increase your chance of falling. Wear closed-toe shoes that fit well and support your feet. Wear shoes that have rubber soles and low heels. Use a cane, walker, scooter, or crutches that help  you move around if needed. Talk with your provider about other ways that you can decrease your risk of falls. This may include seeing a physical therapist to learn to do exercises to improve movement and strength. Where to find more information Centers for Disease Control and Prevention, STEADI: TonerPromos.no [employer] on Aging: BaseRingTones.pl National Institute on Aging: BaseRingTones.pl Contact a health care provider if: You are afraid of falling at home. You feel weak, drowsy, or dizzy at home. You fall at home. Get help right away if you: Lose consciousness or have trouble moving after a fall. Have a fall that causes a head injury. These symptoms may be an emergency. Get help right away. Call 911. Do not wait to see if the symptoms will go away. Do not drive yourself to the hospital. This information is not intended to replace advice given to you by your health care provider. Make sure you discuss any questions you have with your health care provider. Document Revised: 02/05/2022 Document Reviewed: 02/05/2022 Elsevier Patient Education  2024 [employer]. A stroke is a Midwife. It should be treated right away. A stroke happens when there is not enough blood flow to the brain. A stroke can lead to brain damage and death. But if a person gets treated right away, they have a better chance of surviving and  recovering. It is very important to recognize the symptoms of a stroke. What types of strokes are there? There are two main types of strokes: Ischemic stroke. This is the most common type. It happens when a blood vessel that sends blood to the brain is blocked. Hemorrhagic stroke. This happens when there is bleeding in the brain. This may be from a blood vessel leaking or bursting. A transient ischemic attack (TIA) causes the same symptoms as a stroke. But the symptoms go away quickly and do not cause lasting damage to the brain. TIAs still need to be treated right away. They are also a sign that you are at higher risk for a stroke. What are the warning signs of a stroke? The symptoms of a stroke may differ based on the part of the brain that is involved. Symptoms often happen all of a sudden. BE FAST symptoms BE FAST is an easy way to remember the main warning signs of a stroke: B - Balance. Signs are dizziness, sudden trouble walking, or loss of balance. E - Eyes. Signs are trouble seeing or a sudden change in vision. F - Face. Signs are sudden weakness or numbness of the face, or the face or eyelid drooping on one side. A - Arms. Signs are weakness or numbness in an arm. This happens suddenly and usually on one side of the body. S - Speech. Signs are sudden trouble speaking, slurred speech, or trouble understanding what people say. T - Time. Time to call emergency services. Write down what time symptoms started. A stroke may be happening even if only one BE FAST symptom is present. Other signs of a stroke Some less common signs of a stroke include: A sudden, severe headache with no known cause. Nausea or vomiting. Seizure. These symptoms may be an emergency. Get help right away. Call 911. Do not wait to see if the symptoms will go away. Do not drive yourself to the hospital.  This information is not intended to replace advice given to you by your health care provider. Make sure you  discuss any questions you have with your health care provider. Document Revised: 03/19/2022  Document Reviewed: 03/19/2022 Elsevier Patient Education  2024 Elsevier Inc. A heart failure action plan helps you know what to do when you have symptoms of heart failure. Your action plan is a color-coded plan that lists the symptoms to watch for and indicates what actions to take. If you have symptoms in the green zone, you're doing well. If you have symptoms in the yellow zone, you're having problems. If you have symptoms in the red zone, you need medical care right away. Follow the plan that was created by you and your health care provider. Review your plan each time you visit your provider. Green zone These signs mean you're doing well and can continue what you're doing: You don't have new or worsening shortness of breath. You have very little swelling or no new swelling. Your weight is stable (no gain or loss). You have a normal activity level. You don't have chest pain or any other new symptoms. Yellow zone These signs and symptoms mean your condition may be getting worse and you should make some changes: You have trouble breathing when you're active. You have swelling in your feet or legs or have discomfort in your belly. You gain 2-3 lb (0.9-1.4 kg) in 24 hours, or 5 lb (2.3 kg) in a week. This amount may be more or less depending on your condition. You get tired easily. You have trouble sleeping. You have a dry cough. If you have any of these symptoms: Contact your provider within the next day. Your provider may adjust your medicines. Red zone These signs and symptoms mean you should get medical help right away: You have trouble breathing when resting or cannot lie flat and you need to raise your head to help you breathe. You have a dry cough that's getting worse. You have swelling or pain in your feet or legs or discomfort in your belly that's getting worse. You suddenly gain more  than 2-3 lb (0.9-1.4 kg) in 24 hours, or more than 5 lb (2.3 kg) in a week. This amount may be more or less depending on your condition. You have trouble staying awake or you feel confused. You don't have an appetite. You have worsening sadness or depression. These symptoms may be an emergency. Call 911 right away. Do not wait to see if the symptoms will go away. Do not drive yourself to the hospital. Follow these instructions at home: Take medicines only as told. Eat a heart-healthy diet. Work with a dietitian to create an eating plan that's best for you. Weigh yourself each day. Your target weight is __________ lb (__________ kg). Call your provider if you gain more than __________ lb (__________ kg) in 24 hours, or more than __________ lb (__________ kg) in a week. Health care provider name: _____________________________________________________ Health care provider phone number: _____________________________________________________ Where to find more information American Heart Association: heart.org This information is not intended to replace advice given to you by your health care provider. Make sure you discuss any questions you have with your health care provider. Document Revised: 01/17/2023 Document Reviewed: 01/17/2023 Elsevier Patient Education  2024 Elsevier Inc.  Diabetes Action Plan A diabetes action plan is a way for you to manage your symptoms of diabetes, also called diabetes mellitus. The plan is color-coded to guide you on what actions to take based on any symptoms you're having. If you have symptoms in the red zone, you need medical care right away. If you have symptoms in the yellow zone, your diabetes isn't under control,  and you may need to make some changes. If you have symptoms in the green zone, you're doing well. Understanding diabetes can take time. Follow the treatment plan that you created with your health care provider. Know the target range for your blood  sugar, also called glucose. Review your plan each time you visit your provider. The target range for my blood sugar level is __________________________ mg/dL. Red zone Get medical help right away if you have any of the following symptoms: A blood sugar test result that's below 54 mg/dL (3 mmol/L). A blood sugar test result that's at or above 240 mg/dL (86.6 mmol/L) for 2 days in a row along with: Extreme thirst and frequent peeing. Confusion or trouble thinking clearly. Moderate or large ketone levels in your pee (urine). Feeling tired or having no energy. Trouble breathing. Sickness or a fever for 2 or more days that's not getting better. These symptoms may be an emergency. Call 911 right away. Do not wait to see if the symptoms will go away. Do not drive yourself to the hospital. If you have very low blood sugar, also called severe hypoglycemia, and you can't eat or drink, you may need glucagon. Make sure a family member or close friend knows how to check your blood sugar and how to give you glucagon. You may need to be treated in a hospital for this condition. Yellow zone If you have any of the following symptoms, your diabetes isn't under control, and you may need to make some changes: A blood sugar test result that's at or above 240 mg/dL (86.6 mmol/L) for 2 days in a row. Blood sugar test results that are below 70 mg/dL (3.9 mmol/L). Other symptoms of hypoglycemia, such as: Shaking or feeling light-headed. Confusion or irritability. Feeling hungry. Having a fast heartbeat. If you have any yellow zone symptoms: Treat your hypoglycemia by eating or drinking 15 grams of a rapid-acting carbohydrate. Follow the 15:15 rule: Take 15 grams of a rapid-acting carbohydrate, such as: 1 tube of glucose gel. 4 glucose pills. 4 oz (120 mL) of fruit juice. 4 oz (120 mL) of regular (not diet) soda. Check your blood sugar again 15 minutes after you take the carbohydrate. If the second blood  sugar test is still at or below 70 mg/dL (3.9 mmol/L), take 15 grams of a carbohydrate again. If your blood sugar doesn't increase above 70 mg/dL (3.9 mmol/L) after 3 tries, get medical help right away. After your blood sugar returns to normal, eat a meal or a snack within 1 hour. Keep taking your daily medicines as told by your provider. Check your blood sugar more often than you normally would. Write down your results. Call your provider if you have trouble keeping your blood sugar in your target range. Green zone These signs mean you're doing well and can continue what you're doing to manage your diabetes: Your blood sugar is within your personal target range. For most people, a blood sugar level before a meal should be 80-130 mg/dL (4.4-7.2 mmol/L). You feel well, and you're able to do daily activities. If you're in the green zone, continue to manage your diabetes as told by your provider. To do this: Eat a healthy diet. Exercise regularly. Check your blood sugar as told. Take your medicines only as told. Where to find more information American Diabetes Association (ADA): diabetes.org Association of Diabetes Care & Education Specialists (ADCES): adces.org/diabetes-education-dsmes This information is not intended to replace advice given to you by your health care provider.  Make sure you discuss any questions you have with your health care provider. Document Revised: 01/23/2023 Document Reviewed: 01/23/2023 Elsevier Patient Education  2024 [employer].

## 2024-01-02 ENCOUNTER — Telehealth: Payer: Self-pay | Admitting: *Deleted

## 2024-01-02 NOTE — Patient Outreach (Signed)
 Phone call to patient to re-schedule follow up phone call today. Left voicemail for a return call.   Trang Bouse, LCSW [place]  [facility], [facility] Licensed Clinical Social Worker  Direct Dial: [phone]

## 2024-01-03 ENCOUNTER — Telehealth: Payer: Self-pay | Admitting: *Deleted

## 2024-01-05 ENCOUNTER — Emergency Department

## 2024-01-05 ENCOUNTER — Encounter: Payer: Self-pay | Admitting: Emergency Medicine

## 2024-01-05 ENCOUNTER — Emergency Department
Admission: EM | Admit: 2024-01-05 | Discharge: 2024-01-05 | Disposition: A | Discharge status: Home / Self Care | Attending: Emergency Medicine | Admitting: Emergency Medicine

## 2024-01-05 ENCOUNTER — Other Ambulatory Visit: Payer: Self-pay

## 2024-01-05 DIAGNOSIS — I130 Hypertensive heart and chronic kidney disease with heart failure and stage 1 through stage 4 chronic kidney disease, or unspecified chronic kidney disease: Secondary | ICD-10-CM | POA: Insufficient documentation

## 2024-01-05 DIAGNOSIS — N189 Chronic kidney disease, unspecified: Secondary | ICD-10-CM | POA: Diagnosis not present

## 2024-01-05 DIAGNOSIS — W01198A Fall on same level from slipping, tripping and stumbling with subsequent striking against other object, initial encounter: Secondary | ICD-10-CM | POA: Diagnosis not present

## 2024-01-05 DIAGNOSIS — S0003XA Contusion of scalp, initial encounter: Secondary | ICD-10-CM | POA: Insufficient documentation

## 2024-01-05 DIAGNOSIS — W19XXXA Unspecified fall, initial encounter: Secondary | ICD-10-CM

## 2024-01-05 DIAGNOSIS — M542 Cervicalgia: Secondary | ICD-10-CM | POA: Diagnosis not present

## 2024-01-05 DIAGNOSIS — I509 Heart failure, unspecified: Secondary | ICD-10-CM | POA: Insufficient documentation

## 2024-01-05 MED ORDER — ACETAMINOPHEN 500 MG PO TABS
1000.0000 mg | ORAL_TABLET | Freq: Once | ORAL | Status: AC
  Administered 2024-01-05: 1000 mg via ORAL
  Filled 2024-01-05: qty 2

## 2024-01-05 NOTE — ED Triage Notes (Signed)
 Presents via EMS fro  home  States she was going to the bathroom  Slipped   fell  hitting her head on bed Hematoma noted to right forehead Having pain to head and neck

## 2024-01-05 NOTE — ED Provider Notes (Signed)
 [facility] Provider Note    Event Date/Time   First MD Initiated Contact with Patient 01/05/24 1407     (approximate)   History   Chief Complaint Fall   HPI  Alexis Farmer is a 83 y.o. female with past medical history of diabetes, hypertension, CHF, CKD, and B-cell lymphoma who presents to the ED following fall.  Patient reports that she was using her walker at home to try to go to the bathroom when she slipped and fell forward, striking her head.  She denies losing consciousness, does report pain and swelling to her right frontal scalp.  She also complains of pain to her neck, denies any injuries to her chest, abdomen, or extremities.  She is currently on Plavix  following recent stroke.     Physical Exam   Triage Vital Signs: ED Triage Vitals  Encounter Vitals Group     BP 01/05/24 1353 134/68     Girls Systolic BP Percentile --      Girls Diastolic BP Percentile --      Boys Systolic BP Percentile --      Boys Diastolic BP Percentile --      Pulse Rate 01/05/24 1353 88     Resp 01/05/24 1353 18     Temp 01/05/24 1353 98.5 F (36.9 C)     Temp Source 01/05/24 1353 Oral     SpO2 01/05/24 1353 95 %     Weight 01/05/24 1349 231 lb 0.7 oz (104.8 kg)     Height 01/05/24 1349 5' 3 (1.6 m)     Head Circumference --      Peak Flow --      Pain Score 01/05/24 1404 10     Pain Loc --      Pain Education --      Exclude from Growth Chart --     Most recent vital signs: Vitals:   01/05/24 1353  BP: 134/68  Pulse: 88  Resp: 18  Temp: 98.5 F (36.9 C)  SpO2: 95%    Constitutional: Alert and oriented. Eyes: Conjunctivae are normal. Head: Right frontal scalp hematoma with no overlying laceration. Nose: No congestion/rhinnorhea. Mouth/Throat: Mucous membranes are moist.  Neck: Midline cervical spine tenderness to palpation. Cardiovascular: Normal rate, regular rhythm. Grossly normal heart sounds.  2+ radial pulses  bilaterally. Respiratory: Normal respiratory effort.  No retractions. Lungs CTAB.  No chest wall tenderness to palpation. Gastrointestinal: Soft and nontender. No distention. Musculoskeletal: No lower extremity tenderness nor edema.  No upper extremity bony tenderness to palpation. Neurologic:  Normal speech and language. No gross focal neurologic deficits are appreciated.    ED Results / Procedures / Treatments   Labs (all labs ordered are listed, but only abnormal results are displayed) Labs Reviewed - No data to display   EKG  ED ECG REPORT I, Carlin Palin, the attending physician, personally viewed and interpreted this ECG.   Date: 01/05/2024  EKG Time: 13:50  Rate: 91  Rhythm: normal sinus rhythm  Axis: LAD  Intervals:none  ST&T Change: None  RADIOLOGY CT head reviewed and interpreted by me with no hemorrhage or midline shift.  PROCEDURES:  Critical Care performed: No  Procedures   MEDICATIONS ORDERED IN ED: Medications  acetaminophen  (TYLENOL ) tablet 1,000 mg (has no administration in time range)     IMPRESSION / MDM / ASSESSMENT AND PLAN / ED COURSE  I reviewed the triage vital signs and the nursing notes.  83 y.o. female with past medical history of hypertension, diabetes, CHF, CKD, and B-cell lymphoma who presents to the ED following fall forward where she struck her head.  Patient's presentation is most consistent with acute complicated illness / injury requiring diagnostic workup.  Differential diagnosis includes, but is not limited to, intracranial injury, cervical spine injury, contusion.  Patient nontoxic-appearing and in no acute distress, vital signs are unremarkable.  She is awake and alert with a nonfocal neurologic exam, does have a right frontal scalp hematoma.  We will further assess with CT imaging of her head and cervical spine, no evidence of traumatic injury to her trunk or extremities.  CT head and  cervical spine are negative for acute process, remarkable only for frontal scalp hematoma.  Patient is appropriate for discharge home with outpatient follow-up, was counseled to return to the ED for new or worsening symptoms.  Patient agrees with plan.      FINAL CLINICAL IMPRESSION(S) / ED DIAGNOSES   Final diagnoses:  Fall, initial encounter  Hematoma of scalp, initial encounter     Rx / DC Orders   ED Discharge Orders     None        Note:  This document was prepared using Dragon voice recognition software and may include unintentional dictation errors.   Willo Dunnings, MD 01/05/24 1556

## 2024-01-05 NOTE — ED Notes (Signed)
 [facility] was called at 4:44 pm

## 2024-01-05 NOTE — ED Notes (Signed)
EKG is completed.

## 2024-01-05 NOTE — ED Notes (Signed)
 [facility] called to transport patient back home.

## 2024-01-06 ENCOUNTER — Encounter: Payer: Self-pay | Admitting: Internal Medicine

## 2024-01-06 ENCOUNTER — Ambulatory Visit: Admitting: Internal Medicine

## 2024-01-06 VITALS — BP 108/70 | HR 72 | Temp 98.8°F | Ht 63.0 in | Wt 231.0 lb

## 2024-01-06 DIAGNOSIS — I152 Hypertension secondary to endocrine disorders: Secondary | ICD-10-CM

## 2024-01-06 DIAGNOSIS — E1129 Type 2 diabetes mellitus with other diabetic kidney complication: Secondary | ICD-10-CM

## 2024-01-06 DIAGNOSIS — Z8673 Personal history of transient ischemic attack (TIA), and cerebral infarction without residual deficits: Secondary | ICD-10-CM | POA: Diagnosis not present

## 2024-01-06 DIAGNOSIS — E1159 Type 2 diabetes mellitus with other circulatory complications: Secondary | ICD-10-CM

## 2024-01-06 DIAGNOSIS — I5032 Chronic diastolic (congestive) heart failure: Secondary | ICD-10-CM

## 2024-01-06 DIAGNOSIS — Z794 Long term (current) use of insulin: Secondary | ICD-10-CM

## 2024-01-06 MED ORDER — ACCU-CHEK GUIDE TEST VI STRP
ORAL_STRIP | 12 refills | Status: DC
Start: 2024-01-06 — End: 2024-04-15

## 2024-01-06 MED ORDER — NYSTATIN 100000 UNIT/GM EX CREA: 1.0000 | TOPICAL_CREAM | Freq: Two times a day (BID) | CUTANEOUS | 0 refills | Status: DC

## 2024-01-06 MED ORDER — INSULIN SYRINGE-NEEDLE U-100 30G X 1/2" 0.5 ML MISC: 3 refills | Status: DC

## 2024-01-06 MED ORDER — MECLIZINE HCL 12.5 MG PO TABS: 12.5000 mg | ORAL_TABLET | Freq: Three times a day (TID) | ORAL | 0 refills | Status: DC | PRN

## 2024-01-06 MED ORDER — NYSTATIN 100000 UNIT/GM EX POWD: 1.0000 | Freq: Two times a day (BID) | CUTANEOUS | 0 refills | Status: DC | PRN

## 2024-01-06 NOTE — Assessment & Plan Note (Addendum)
-   Patient is on insulin  NPH 15 to 20 units twice daily. -She may benefit from addition of initial to 2 inhibitor if possible given her history of diastolic heart failure as well as diabetes if her GFR permits.  To be addressed at her next visit -Her last A1c was 8.7 in May -She will need repeat A1c at her follow-up visit -Will refill her Accu-Chek test strips as well as her insulin  syringe and needles today -No further workup at this time

## 2024-01-06 NOTE — Assessment & Plan Note (Signed)
-   Blood pressure today is well-controlled at 108/70 -Patient is on spironolactone  3 times a week, carvedilol  twice daily, hydralazine  100 mg twice daily (skips the evening dose for blood pressure is low), clonidine  0.1 mg twice daily -Will continue with current medications for now (medication list has clonidine  0.2 mg 3 times daily but this is not what is on her pill bottle) -No further workup at this time

## 2024-01-06 NOTE — Progress Notes (Signed)
 Acute Office Visit  Subjective:     Patient ID: Alexis Farmer, female    DOB: [birth year], 83 y.o.   MRN: [id number]  Chief Complaint  Patient presents with   Acute Visit    Patient discharged from skilled nursing on 12/26/23. Needs referral to [facility] PT[facility]    HPI Patient is in today for follow-up from hospitalization in May.  Patient was hospitalized in May for acute CVA with MRI showing nonhemorrhagic cortical infarct in the right frontal and left parietal lobes.  Patient was discharged to SNF after that and was sent home from the SNF 10 days ago.    Patient states that she had a fall yesterday and was taken to the emergency room for back.  Patient was walking with a walker to the bathroom and her feet hit the walker and she fell back.  She states that she tried to sit down in her chair but slid and hit her head on the side of the bed.  In the ED she had imaging done which showed a right frontal scalp hematoma but no other acute issues.  Patient does complain of a headache but denies any other complaints at this time.  She also complains of bilateral lower extremity swelling but states that this is improved from before.  She is compliant with her medications for heart failure including Lasix .  Patient is on insulin  NPH for her diabetes and has been compliant with this medication.  States her blood sugars have been better on this regimen.  Her A1c in May was 8.7.  Medication list was reviewed in detail with the patient.  Per neurology notes she was supposed to stop the Plavix  after 3 weeks but has been continued on this medication and is taking aspirin  as well 2 days a week.  I have instructed her to take aspirin  daily and to stop the Plavix  per neurology recommendations.   Transition of care phone call was done on July 11 by LPN.  Review of Systems  Constitutional: Negative.   HENT: Negative.    Respiratory: Negative.    Cardiovascular:  Positive for leg swelling.        Complains of chronic bilateral lower extremity swelling but this is improved from prior.  Gastrointestinal: Negative.   Musculoskeletal: Negative.   Neurological: Negative.   Psychiatric/Behavioral: Negative.          Objective:    BP 108/70   Pulse 72   Temp 98.8 F (37.1 C)   Ht 5' 3 (1.6 m)   Wt 231 lb (104.8 kg)   SpO2 97%   BMI 40.92 kg/m    Physical Exam Constitutional:      Appearance: Normal appearance.  HENT:     Head: Normocephalic.     Comments: Right scalp hematoma noted on exam with overlying ecchymosis Cardiovascular:     Rate and Rhythm: Normal rate and regular rhythm.     Heart sounds: Murmur heard.  Pulmonary:     Breath sounds: No wheezing or rales.  Abdominal:     General: Bowel sounds are normal. There is no distension.     Tenderness: There is no abdominal tenderness.  Musculoskeletal:        General: Swelling and tenderness present.     Comments: 1+ bilateral lower extremity pitting edema noted on exam with mild tenderness to palpation  Neurological:     Mental Status: She is alert and oriented to person, place, and time.  Psychiatric:  Mood and Affect: Mood normal.        Behavior: Behavior normal.     No results found for any visits on 01/06/24.      Assessment & Plan:   Problem List Items Addressed This Visit       Cardiovascular and Mediastinum   Chronic diastolic CHF (congestive heart failure) (HCC)   - Patient history of chronic diastolic heart failure -She is on spironolactone , Coreg , hydralazine  as well as torsemide  30 mg daily -She has 1+ bilateral extremity pitting edema on exam.  Lungs are clear to auscultation -Per family and patient her lower extremity swelling has actually improved -Will continue to monitor closely -We were going to check a BMP today.  Patient states that she had this recently done at [facility].  We will obtain records from SNF      Relevant Medications   cloNIDine  (CATAPRES ) 0.1 MG tablet    Hypertension associated with diabetes (HCC)   - Blood pressure today is well-controlled at 108/70 -Patient is on spironolactone  3 times a week, carvedilol  twice daily, hydralazine  100 mg twice daily (skips the evening dose for blood pressure is low), clonidine  0.1 mg twice daily -Will continue with current medications for now (medication list has clonidine  0.2 mg 3 times daily but this is not what is on her pill bottle) -No further workup at this time      Relevant Medications   cloNIDine  (CATAPRES ) 0.1 MG tablet     Endocrine   Type 2 diabetes mellitus with other diabetic kidney complication (HCC)   - Patient is on insulin  NPH 15 to 20 units twice daily. -She may benefit from addition of initial to 2 inhibitor if possible given her history of diastolic heart failure as well as diabetes if her GFR permits.  To be addressed at her next visit -Her last A1c was 8.7 in May -She will need repeat A1c at her follow-up visit -Will refill her Accu-Chek test strips as well as her insulin  syringe and needles today -No further workup at this time      Relevant Medications   Insulin  Syringe-Needle U-100 30G X 1/2 0.5 ML MISC   glucose blood (ACCU-CHEK GUIDE TEST) test strip     Other   History of CVA (cerebrovascular accident) - Primary   - Patient was admitted to the hospital in May and was found to have nonhemorrhagic cortical infarcts in the right frontal and left parietal lobes -She she was then discharged to rehab and is following up today for her transition of care visit -Medications reviewed with her.  We will stop the Plavix  since it is more than 3 weeks and continue with aspirin  daily per neurology recommendations. -Will continue medications for blood pressure control as well as her diabetes  -Will continue with Crestor  20 mg daily -Referral to home health made today -Patient instructed to make follow-up appointment with the stroke clinic (Dr. Rosemarie) -No further workup at this time        Meds ordered this encounter  Medications   meclizine  (ANTIVERT ) 12.5 MG tablet    Sig: Take 1 tablet (12.5 mg total) by mouth 3 (three) times daily as needed for dizziness.    Dispense:  30 tablet    Refill:  0   Insulin  Syringe-Needle U-100 30G X 1/2 0.5 ML MISC    Sig: Take as directed    Dispense:  100 each    Refill:  3   glucose blood (ACCU-CHEK GUIDE TEST) test strip  Sig: Use as instructed    Dispense:  100 each    Refill:  12   DISCONTD: nystatin  cream (MYCOSTATIN )    Sig: Apply 1 Application topically 2 (two) times daily.    Dispense:  30 g    Refill:  0   nystatin  (MYCOSTATIN /NYSTOP ) powder    Sig: Apply 1 Application topically 2 (two) times daily as needed.    Dispense:  60 g    Refill:  0    No follow-ups on file.  Talin Feister, MD

## 2024-01-06 NOTE — Assessment & Plan Note (Signed)
-   Patient history of chronic diastolic heart failure -She is on spironolactone , Coreg , hydralazine  as well as torsemide  30 mg daily -She has 1+ bilateral extremity pitting edema on exam.  Lungs are clear to auscultation -Per family and patient her lower extremity swelling has actually improved -Will continue to monitor closely -We were going to check a BMP today.  Patient states that she had this recently done at [facility].  We will obtain records from SNF

## 2024-01-06 NOTE — Assessment & Plan Note (Addendum)
-   Patient was admitted to the hospital in May and was found to have nonhemorrhagic cortical infarcts in the right frontal and left parietal lobes -She she was then discharged to rehab and is following up today for her transition of care visit -Medications reviewed with her.  We will stop the Plavix  since it is more than 3 weeks and continue with aspirin  daily per neurology recommendations. -Will continue medications for blood pressure control as well as her diabetes  -Will continue with Crestor  20 mg daily -Referral to home health made today -Patient instructed to make follow-up appointment with the stroke clinic (Dr. Rosemarie) -No further workup at this time

## 2024-01-06 NOTE — Patient Instructions (Signed)
-   It was a pleasure meeting you today -We will check some blood work today including your kidney function as well as your blood counts to make sure that they are doing well after your fall -Continue with your current medications as we discussed.  You will need to contact your neurologist (Dr. Rosemarie) for follow-up for your stroke.  He had recommended only 3 weeks of aspirin  and Plavix  together and then stopping the Plavix .  You are still on this medication.  Please discuss this with him -We will put in refills for

## 2024-01-14 ENCOUNTER — Encounter: Payer: Self-pay | Admitting: Oncology

## 2024-01-14 ENCOUNTER — Inpatient Hospital Stay: Payer: Medicare Other | Attending: Oncology

## 2024-01-14 ENCOUNTER — Inpatient Hospital Stay (HOSPITAL_BASED_OUTPATIENT_CLINIC_OR_DEPARTMENT_OTHER): Payer: Medicare Other | Admitting: Oncology

## 2024-01-14 VITALS — BP 114/66 | HR 62 | Temp 96.4°F | Resp 18

## 2024-01-14 DIAGNOSIS — D72820 Lymphocytosis (symptomatic): Secondary | ICD-10-CM | POA: Diagnosis present

## 2024-01-14 LAB — CMP (CANCER CENTER ONLY)
ALT: 17 U/L (ref 0–44)
AST: 23 U/L (ref 15–41)
Albumin: 3.7 g/dL (ref 3.5–5.0)
Alkaline Phosphatase: 48 U/L (ref 38–126)
Anion gap: 8 (ref 5–15)
BUN: 31 mg/dL — ABNORMAL HIGH (ref 8–23)
CO2: 31 mmol/L (ref 22–32)
Calcium: 9.9 mg/dL (ref 8.9–10.3)
Chloride: 95 mmol/L — ABNORMAL LOW (ref 98–111)
Creatinine: 1.77 mg/dL — ABNORMAL HIGH (ref 0.44–1.00)
GFR, Estimated: 28 mL/min — ABNORMAL LOW (ref >60)
Glucose, Bld: 189 mg/dL — ABNORMAL HIGH (ref 70–99)
Potassium: 4.1 mmol/L (ref 3.5–5.1)
Sodium: 134 mmol/L — ABNORMAL LOW (ref 135–145)
Total Bilirubin: 1 mg/dL (ref 0.0–1.2)
Total Protein: 7.4 g/dL (ref 6.5–8.1)

## 2024-01-14 LAB — CBC WITH DIFFERENTIAL (CANCER CENTER ONLY)
Abs Immature Granulocytes: 0.01 K/uL (ref 0.00–0.07)
Basophils Absolute: 0.1 K/uL (ref 0.0–0.1)
Basophils Relative: 1 %
Eosinophils Absolute: 0.3 K/uL (ref 0.0–0.5)
Eosinophils Relative: 5 %
HCT: 36.2 % (ref 36.0–46.0)
Hemoglobin: 11.7 g/dL — ABNORMAL LOW (ref 12.0–15.0)
Immature Granulocytes: 0 %
Lymphocytes Relative: 50 %
Lymphs Abs: 3.5 K/uL (ref 0.7–4.0)
MCH: 28.6 pg (ref 26.0–34.0)
MCHC: 32.3 g/dL (ref 30.0–36.0)
MCV: 88.5 fL (ref 80.0–100.0)
Monocytes Absolute: 0.6 K/uL (ref 0.1–1.0)
Monocytes Relative: 8 %
Neutro Abs: 2.5 K/uL (ref 1.7–7.7)
Neutrophils Relative %: 36 %
Platelet Count: 546 K/uL — ABNORMAL HIGH (ref 150–400)
RBC: 4.09 MIL/uL (ref 3.87–5.11)
RDW: 14.1 % (ref 11.5–15.5)
WBC Count: 7 K/uL (ref 4.0–10.5)
nRBC: 0 % (ref 0.0–0.2)

## 2024-01-14 NOTE — Progress Notes (Signed)
 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  Labs are reviewed and discussed with patient. 12/26/21 peripheral blood flowcytometry showed CD5 positive monoclonal B cell lymphocytes, 17% of leukocytes, <5000/ul, phenotype is non specific.  10/10/2022 flowcytometry showed a partial CD5 positive monoclonal B-cell population detected, 15% of leukocytes, <5,000/ul.   Patient is asymptomatic, no constitutional symptoms. Recommend observation.    Orders Placed This Encounter  Procedures   CMP (Cancer Center only)    Standing Status:   Future    Expected Date:   07/16/2024    Expiration Date:   10/14/2024   CBC with Differential (Cancer Center Only)    Standing Status:   Future    Expected Date:   07/16/2024    Expiration Date:   10/14/2024   Follow-up in 6 months. All questions were answered. The patient knows to call the clinic with any problems, questions or concerns.  Zelphia Cap, MD, PhD [facility] Hematology Oncology 01/14/2024     HISTORY OF PRESENTING ILLNESS:  Alexis Farmer is a  83 y.o.  female with PMH listed below who was referred to me for evaluation of leukocytosis Reviewed patient' recent labs obtained by PCP.   12/13/2021 CBC showed elevated white count of 6.6, increased lymphocyte percentage 55.7.  Absolute lymphocytes 3.7. Previous lab records reviewed. Leukocytosis onset of intermittent. Patient reports chronic arthritis, bilateral lower extremity swelling, left worse than right.  She was recently seen by dermatology for a skin lesion on her back.  Patient reports that her previously weighed 260 pounds.  Currently she weighed 240 pounds. Appetite is fair. No fever, night sweating.  Patient was accompanied by her  granddaughter.  08/12/21 CT abdomen pelvis did not show any lymphadenopathy. Physical examination did not showed lymphadenopathy.   INTERVAL HISTORY Alexis Farmer is a 83 y.o. female who has above history reviewed by me today presents for follow up visit for monoclonal lymphocytosis of unknown significance.. Multiple recent hospitalization due to acute CHF/stroke. The patient expresses no new concerns today.  She is anxious about how her blood work looks like.  Review of Systems  Constitutional:  Positive for fatigue. Negative for appetite change, chills, fever and unexpected weight change.  HENT:   Negative for hearing loss and voice change.   Eyes:  Negative for eye problems.  Respiratory:  Negative for chest tightness and cough.   Cardiovascular:  Negative for chest pain.  Gastrointestinal:  Negative for abdominal distention, abdominal pain and blood in stool.  Endocrine: Negative for hot flashes.  Genitourinary:  Negative for difficulty urinating and frequency.   Musculoskeletal:  Positive for arthralgias.  Skin:  Negative for itching.  Neurological:  Negative for extremity weakness.  Hematological:  Negative for adenopathy.  Psychiatric/Behavioral:  Negative for confusion.      MEDICAL HISTORY:  Past Medical History:  Diagnosis Date   Acute on chronic heart failure with preserved ejection fraction (HFpEF) (HCC) 04/01/2023   Anxiety 09/13/2015   Arthritis    Atypical chest pain 01/15/2021   Blackhead 10/24/2021   Bradycardia 07/17/2016   Formatting of this note might be different from the original.  Last Assessment & Plan:   Found to be bradycardic at outside hospital. They stopped metoprolol  and verapamil  and heart rate has been normal since then. Echo appears to have been reassuring at the outside  facility. Appears to be doing much better with regards to this. She'll continue to monitor for recurrent symptoms. She'll continue to   Cervical spondylosis with radiculopathy  01/26/2017   Formatting of this note might be different from the original.  Last Assessment & Plan:   Continues to have issues with this.  She is following with a specialist and it sounds as though they are planning on an MRI.  Benign exam today.   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 Dennise Deal of this note might be different from the original.  Dr. Dennise      Last Assessment & Plan:   Recheck kidney function today.   Constipation 11/17/2019   Cough 01/25/2016   Formatting of this note might be different from the original.  Last Assessment & Plan:   Minimal nighttime cough. Improves when she washes her pillows consistently. Suspect allergies contributing. She'll monitor.   Diabetes mellitus without complication (HCC)    one elevated reading/ no treatment   Disorder of rotator cuff 06/24/2018   Diverticulitis    Dysuria 03/22/2017   Formatting of this note might be different from the original.  Last Assessment & Plan:   Symptoms concerning for UTI.  Will check urinalysis.   Edema of foot 04/08/2014   Formatting of this note might be different from the original.  Last Assessment & Plan:   Chronic pedal edema. Suspect venous insufficiency. No orthopnea or shortness of breath. Advised elevation of her legs. Consider compression stockings in the future.   Elevated troponin 12/15/2020   Essential hypertension 04/08/2014   Formatting of this note might be different from the original.  Last Assessment & Plan:   Well-controlled on recheck.  Continue current regimen.   Fall 03/26/2018   Gastroenteritis 08/12/2021   Gastrointestinal hemorrhage 08/03/2015   GI bleed    High cholesterol    History of blood transfusion    Hyperglycemia due to type 2 diabetes mellitus (HCC) 03/11/2015   Hypertension    Hypertensive urgency 12/15/2020   Low back pain 04/08/2014   Morbid obesity (HCC) 04/08/2014   Formatting of this note might be different from the original.   Last Assessment & Plan:   Weight is stable. Discussed diet and exercise at length. Encouraged whatever exercise she can do. Given diet information.   Palpitations 12/15/2020   Peripheral edema 12/22/2019   Postmenopausal bleeding 07/17/2016   Formatting of this note might be different from the original.  Last Assessment & Plan:   Recent D&C. Following with gynecology. Benign findings. Monitor for recurrence.   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  Last Assessment & Plan:   Continues to have issues with this.  She is following with a specialist and it sounds as though they are planning on an MRI.  Benign exam today.   Recurrent falls 08/13/2019   Renal insufficiency    Skin cyst 10/24/2021   Stage 3a chronic kidney disease (HCC) 01/15/2021   Swelling of lower leg 07/19/2016   Vertigo 10/13/2015   Formatting of this note might be different from the original.  Last Assessment & Plan:   No recurrence. Discussed that meclizine  is an as needed medication and that she does not need to take this daily. She will continue to monitor.    SURGICAL  HISTORY: Past Surgical History:  Procedure Laterality Date   APPENDECTOMY     CHOLECYSTECTOMY     ECTOPIC PREGNANCY SURGERY     EYE SURGERY     bilateral cataracts   EYE SURGERY     02/11/2019 repair hole in right eye    gallbladder      HYSTEROSCOPY WITH D & C N/A 10/26/2016   Procedure: DILATATION AND CURETTAGE /HYSTEROSCOPY;  Surgeon: Verdon Keen, MD;  Location: [facility] ORS;  Service: Gynecology;  Laterality: N/A;   HYSTEROSCOPY WITH D & C N/A 07/07/2018   Procedure: DILATATION AND CURETTAGE /HYSTEROSCOPY;  Surgeon: Verdon Keen, MD;  Location: [facility] OR[facility];  Service: Gynecology;  Laterality: N/A;   RIGHT HEART CATH Bilateral 10/10/2023   Procedure: RIGHT HEART CATH;  Surgeon: Cherrie Toribio SAUNDERS,  MD;  Location: [facility];  Service: Cardiovascular;  Laterality: Bilateral;   THYROIDECTOMY, PARTIAL      SOCIAL HISTORY: Social History   Socioeconomic History   Marital status: Widowed    Spouse name: Not on file   Number of children: 1   Years of education: 68   Highest education level: 12th grade  Occupational History   Occupation: retired    Comment: hx of Retail banker, Advertising copywriter, 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  Tobacco Use   Smoking status: Never   Smokeless tobacco: Never  Vaping Use   Vaping status: Never Used  Substance and Sexual Activity   Alcohol use: No   Drug use: No   Sexual activity: Not Currently  Other Topics Concern   Not on file  Social History Narrative   Lives alone    From [place]   Widowed - was married 3 times starting at age 89    hx of seamtress, 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   Social Drivers of Health   Financial Resource Strain: Medium Risk (10/03/2023)   Overall Financial Resource Strain (CARDIA)    Difficulty of Paying Living Expenses: Somewhat hard  Food Insecurity: No Food Insecurity (01/01/2024)   Hunger Vital Sign    Worried About Running Out of Food in the Last Year: Never true    Ran Out of Food in the Last Year: Never true  Recent Concern: Food Insecurity - Food Insecurity Present (10/03/2023)   Hunger Vital Sign    Worried About Running Out of Food in the Last Year: Sometimes true    Ran Out of Food in the Last Year: Never true  Transportation Needs: No Transportation Needs (01/01/2024)   PRAPARE - Administrator, Civil Service (Medical): No    Lack of Transportation (Non-Medical): No  Physical Activity: Inactive (10/03/2023)   Exercise Vital Sign    Days of Exercise per Week: 0 days    Minutes of Exercise per Session: 0 min  Stress: Stress Concern Present (11/29/2022)   [employer] of Occupational Health - Occupational  Stress Questionnaire    Feeling of Stress : To some extent  Social Connections: Moderately Integrated (10/21/2023)   Social Connection and Isolation Panel    Frequency of Communication with Friends and Family: More than three times a week    Frequency of Social Gatherings with Friends and Family: More than three times a week    Attends Religious Services: More than 4 times per year    Active Member of [employer] or Organizations: Yes    Attends Banker Meetings: More than 4 times per year  Marital Status: Widowed  Intimate Partner Violence: Not At Risk (01/01/2024)   Humiliation, Afraid, Rape, and Kick questionnaire    Fear of Current or Ex-Partner: No    Emotionally Abused: No    Physically Abused: No    Sexually Abused: No    FAMILY HISTORY: Family History  Problem Relation Age of Onset   Diabetes Mother    Hypertension Mother    Stroke Mother    Diabetes Other    Healthy Father    Diabetes Sister    Heart disease Sister     ALLERGIES:  is allergic to celexa  [citalopram ], jardiance  [empagliflozin ], norvasc  [amlodipine ], tape, penicillin v, penicillin v potassium, and penicillins.  MEDICATIONS:  Current Outpatient Medications  Medication Sig Dispense Refill   acetaminophen  (TYLENOL ) 500 MG tablet Take 500 mg by mouth every 6 (six) hours as needed for mild pain or headache.     Ascorbic Acid (VITAMIN C PO) Take 1 tablet by mouth daily.     aspirin  EC 81 MG tablet Take 81 mg by mouth daily. Swallow whole.     benzonatate  (TESSALON ) 200 MG capsule Take 200 mg by mouth 3 (three) times daily as needed for cough.     blood glucose meter kit and supplies KIT Accu chek, Dx code E11.65, check 3 times daily 1 each 0   Blood Pressure KIT 1 Device by Does not apply route daily. 1 kit 0   carvedilol  (COREG ) 6.25 MG tablet Take 1 tablet (6.25 mg total) by mouth 2 (two) times daily with a meal.     cloNIDine  (CATAPRES ) 0.1 MG tablet Take 0.1 mg by mouth 2 (two) times daily.      gabapentin  (NEURONTIN ) 100 MG capsule Take 100 mg by mouth 2 (two) times daily.     gabapentin  (NEURONTIN ) 300 MG capsule Take 300 mg by mouth at bedtime.     glucose blood (ACCU-CHEK GUIDE TEST) test strip Use as instructed 100 each 12   hydrALAZINE  (APRESOLINE ) 100 MG tablet TAKE 1 TABLET BY MOUTH 4 TIMES DAILY. MUST KEEP UPCOMING APPOINTMENT ON 10/21/23 FOR FUTURE REFILLS. 360 tablet 0   insulin  NPH Human (NOVOLIN N) 100 UNIT/ML injection Inject 15-20 Units into the skin 2 (two) times daily before a meal. Inject 20 units in the morning an 15 units in the evening.     Insulin  Syringe-Needle U-100 (BD INSULIN  SYRINGE U/F) 30G X 1/2 0.5 ML MISC USE UP TO 5X PER DAY WITH INSULIN  2X (NPH) AND 3X (REGULAR) 500 each 12   Insulin  Syringe-Needle U-100 30G X 1/2 0.5 ML MISC Take as directed 100 each 3   ipratropium-albuterol  (DUONEB) 0.5-2.5 (3) MG/3ML SOLN Take 3 mLs by nebulization 4 (four) times daily. Take scheduled 4 times daily until wheezing and shortness of breath from RSV improves. Then take as needed.     meclizine  (ANTIVERT ) 12.5 MG tablet Take 1 tablet (12.5 mg total) by mouth 3 (three) times daily as needed for dizziness. 30 tablet 0   Multiple Vitamins-Minerals (CENTRUM SILVER 50+WOMEN) TABS Take 1 tablet by mouth daily with breakfast.     nystatin  (MYCOSTATIN /NYSTOP ) powder Apply 1 Application topically 2 (two) times daily as needed. 60 g 0   rosuvastatin  (CRESTOR ) 20 MG tablet TAKE 1 TABLET BY MOUTH EVERY DAY 90 tablet 3   senna-docusate (SENOKOT-S) 8.6-50 MG tablet Take 1 tablet by mouth 2 (two) times daily.     spironolactone  (ALDACTONE ) 25 MG tablet Take 25 mg by mouth 3 (three) times a week. Monday  Wednesday and friday     torsemide  (DEMADEX ) 20 MG tablet Take 1.5 tablets (30 mg total) by mouth daily. 42 tablet 5   traMADol  (ULTRAM ) 50 MG tablet Take 25 mg by mouth every 8 (eight) hours as needed.     traZODone  (DESYREL ) 50 MG tablet Take 1 tablet (50 mg total) by mouth at bedtime as  needed for sleep.     No current facility-administered medications for this visit.     PHYSICAL EXAMINATION: ECOG PERFORMANCE STATUS: 2 - Symptomatic, <50% confined to bed Vitals:   01/14/24 0959  BP: 114/66  Pulse: 62  Resp: 18  Temp: (!) 96.4 F (35.8 C)  SpO2: 99%   There were no vitals filed for this visit.   Physical Exam Constitutional:      General: She is not in acute distress.    Appearance: She is obese.  HENT:     Head: Normocephalic.  Eyes:     General: No scleral icterus. Cardiovascular:     Rate and Rhythm: Normal rate.  Pulmonary:     Effort: Pulmonary effort is normal. No respiratory distress.     Breath sounds: No wheezing.  Abdominal:     General: Bowel sounds are normal. There is no distension.     Palpations: Abdomen is soft.  Musculoskeletal:        General: No deformity. Normal range of motion.     Cervical back: Normal range of motion and neck supple.     Right lower leg: Edema present.     Left lower leg: Edema present.  Skin:    General: Skin is warm and dry.     Findings: No erythema or rash.  Neurological:     Mental Status: She is alert and oriented to person, place, and time. Mental status is at baseline.  Psychiatric:        Mood and Affect: Mood normal.       RADIOGRAPHIC STUDIES: I have personally reviewed the radiological images as listed and agreed with the findings in the report. CT Head Wo Contrast Result Date: 01/05/2024 CLINICAL DATA:  Slipped and fell hitting her head. Hematoma to the right forehead. Head and neck pain. EXAM: CT HEAD WITHOUT CONTRAST CT CERVICAL SPINE WITHOUT CONTRAST TECHNIQUE: Multidetector CT imaging of the head and cervical spine was performed following the standard protocol without intravenous contrast. Multiplanar CT image reconstructions of the cervical spine were also generated. RADIATION DOSE REDUCTION: This exam was performed according to the departmental dose-optimization program which includes  automated exposure control, adjustment of the mA and/or kV according to patient size and/or use of iterative reconstruction technique. COMPARISON:  08/03/2023. FINDINGS: CT HEAD FINDINGS Brain: No evidence of acute infarction, hemorrhage, hydrocephalus, extra-axial collection or mass lesion/mass effect. Age-appropriate volume loss. Patchy white matter hypoattenuation noted consistent with moderate chronic microvascular ischemic change. Findings are stable. Vascular: No hyperdense vessel or unexpected calcification. Skull: Normal. Negative for fracture or focal lesion. Sinuses/Orbits: Globes and orbits are unremarkable. Sinuses are clear. Other: Right frontal scalp hematoma. CT CERVICAL SPINE FINDINGS Alignment: Normal. Skull base and vertebrae: No acute fracture. No primary bone lesion or focal pathologic process. Soft tissues and spinal canal: No prevertebral fluid or swelling. No visible canal hematoma. Disc levels: Moderate loss of disc height at C2-C3. Moderate loss of disc height at C 6-C7. Facet degenerative change most evident on the right at C3-C4 and on the left at C4-C5. No convincing disc herniation or significant stenosis. Upper chest: No acute  findings. Other: None. IMPRESSION: HEAD CT 1. No acute intracranial abnormalities. 2. Right frontal scalp hematoma.  No underlying fracture. CERVICAL CT 1. No fracture or acute finding. Electronically Signed   By: Alm Parkins M.D.   On: 01/05/2024 15:49   CT Cervical Spine Wo Contrast Result Date: 01/05/2024 CLINICAL DATA:  Slipped and fell hitting her head. Hematoma to the right forehead. Head and neck pain. EXAM: CT HEAD WITHOUT CONTRAST CT CERVICAL SPINE WITHOUT CONTRAST TECHNIQUE: Multidetector CT imaging of the head and cervical spine was performed following the standard protocol without intravenous contrast. Multiplanar CT image reconstructions of the cervical spine were also generated. RADIATION DOSE REDUCTION: This exam was performed according to the  departmental dose-optimization program which includes automated exposure control, adjustment of the mA and/or kV according to patient size and/or use of iterative reconstruction technique. COMPARISON:  08/03/2023. FINDINGS: CT HEAD FINDINGS Brain: No evidence of acute infarction, hemorrhage, hydrocephalus, extra-axial collection or mass lesion/mass effect. Age-appropriate volume loss. Patchy white matter hypoattenuation noted consistent with moderate chronic microvascular ischemic change. Findings are stable. Vascular: No hyperdense vessel or unexpected calcification. Skull: Normal. Negative for fracture or focal lesion. Sinuses/Orbits: Globes and orbits are unremarkable. Sinuses are clear. Other: Right frontal scalp hematoma. CT CERVICAL SPINE FINDINGS Alignment: Normal. Skull base and vertebrae: No acute fracture. No primary bone lesion or focal pathologic process. Soft tissues and spinal canal: No prevertebral fluid or swelling. No visible canal hematoma. Disc levels: Moderate loss of disc height at C2-C3. Moderate loss of disc height at C 6-C7. Facet degenerative change most evident on the right at C3-C4 and on the left at C4-C5. No convincing disc herniation or significant stenosis. Upper chest: No acute findings. Other: None. IMPRESSION: HEAD CT 1. No acute intracranial abnormalities. 2. Right frontal scalp hematoma.  No underlying fracture. CERVICAL CT 1. No fracture or acute finding. Electronically Signed   By: Alm Parkins M.D.   On: 01/05/2024 15:49   VAS US  LOWER EXTREMITY VENOUS (DVT) Result Date: 10/25/2023  Lower Venous DVT Study Patient Name:  MEE MACDONNELL  Date of Exam:   10/24/2023 Medical Rec #: [id number]         Accession #:    [id number] Date of Birth: 1940/07/03         Patient Gender: F Patient Age:   38 years Exam Location:  [facility] Procedure:      VAS US  LOWER EXTREMITY VENOUS (DVT) Referring Phys: PRANAV PATEL  --------------------------------------------------------------------------------  Indications: Left lateral hip and thigh pain.  Limitations: Body habitus and Skin texture, and patient's inability to tolerate compression. Comparison Study: Prior negative bilateral LEV done at [facility] 08/04/2023 Performing Technologist: Alberta Lis RVS  Examination Guidelines: A complete evaluation includes B-mode imaging, spectral Doppler, color Doppler, and power Doppler as needed of all accessible portions of each vessel. Bilateral testing is considered an integral part of a complete examination. Limited examinations for reoccurring indications may be performed as noted. The reflux portion of the exam is performed with the patient in reverse Trendelenburg.  +---------+---------------+---------+-----------+----------+--------------+ RIGHT    CompressibilityPhasicitySpontaneityPropertiesThrombus Aging +---------+---------------+---------+-----------+----------+--------------+ CFV      Full           Yes      Yes                                 +---------+---------------+---------+-----------+----------+--------------+ SFJ      Full                                                        +---------+---------------+---------+-----------+----------+--------------+  FV Prox  Full                                                        +---------+---------------+---------+-----------+----------+--------------+ FV Mid   Full                                                        +---------+---------------+---------+-----------+----------+--------------+ FV DistalFull                                                        +---------+---------------+---------+-----------+----------+--------------+ PFV      Full                                                        +---------+---------------+---------+-----------+----------+--------------+ POP      Full           Yes      Yes                                  +---------+---------------+---------+-----------+----------+--------------+ PTV      Full                                                        +---------+---------------+---------+-----------+----------+--------------+ PERO     Full                                                        +---------+---------------+---------+-----------+----------+--------------+   +---------+---------------+---------+-----------+----------+-------------------+ LEFT     CompressibilityPhasicitySpontaneityPropertiesThrombus Aging      +---------+---------------+---------+-----------+----------+-------------------+ CFV                     Yes      Yes                  patent by color and                                                       Doppler             +---------+---------------+---------+-----------+----------+-------------------+ SFJ                     Yes      Yes  patent by color and                                                       Doppler             +---------+---------------+---------+-----------+----------+-------------------+ FV Prox                 Yes      Yes                  patent by color and                                                       Doppler             +---------+---------------+---------+-----------+----------+-------------------+ FV Mid   Full                                                             +---------+---------------+---------+-----------+----------+-------------------+ FV DistalFull                                                             +---------+---------------+---------+-----------+----------+-------------------+ PFV                                                   Not well visualized +---------+---------------+---------+-----------+----------+-------------------+ POP                     Yes      Yes                  patent by color and                                                        Doppler             +---------+---------------+---------+-----------+----------+-------------------+ PTV      Full                                                             +---------+---------------+---------+-----------+----------+-------------------+ PERO     Full                                                             +---------+---------------+---------+-----------+----------+-------------------+  Summary: RIGHT: - There is no evidence of deep vein thrombosis in the lower extremity.  - A cystic structure is found in the popliteal fossa.  LEFT: - There is no evidence of deep vein thrombosis in the lower extremity. However, portions of this examination were limited- see technologist comments above.  - No cystic structure found in the popliteal fossa.  *See table(s) above for measurements and observations. Electronically signed by Debby Robertson on 10/25/2023 at 9:29:37 AM.    Final    DG Ankle Left Port Result Date: 10/22/2023 CLINICAL DATA:  Left ankle pain, numbness, twitching EXAM: PORTABLE LEFT ANKLE - 2 VIEW COMPARISON:  Radiographs 08/03/2023 FINDINGS: Swelling about the left ankle. No acute fracture or dislocation. Plantar calcaneal spur. Vascular calcifications. Findings are unchanged from 08/03/2023. IMPRESSION: Swelling about the left ankle without acute fracture or dislocation. Electronically Signed   By: Norman Gatlin M.D.   On: 10/22/2023 21:18   VAS US  CAROTID Result Date: 10/22/2023 Carotid Arterial Duplex Study Patient Name:  ZALMA CHANNING  Date of Exam:   10/21/2023 Medical Rec #: [id number]         Accession #:    [id number] Date of Birth: June 09, 1941         Patient Gender: F Patient Age:   83 years Exam Location:  [facility] Procedure:      VAS US  CAROTID Referring Phys: KARNA GERALDS --------------------------------------------------------------------------------  Indications:       CVA. Risk Factors:       Hypertension, hyperlipidemia, Diabetes, prior CVA. 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 Performing Technologist: Garnette Rockers  Examination Guidelines: A complete evaluation includes B-mode imaging, spectral Doppler, color Doppler, and power Doppler as needed of all accessible portions of each vessel. Bilateral testing is considered an integral part of a complete examination. Limited examinations for reoccurring indications may be performed as noted.  Right Carotid Findings: +----------+--------+--------+--------+--------------------+------------------+           PSV cm/sEDV cm/sStenosisPlaque Description  Comments           +----------+--------+--------+--------+--------------------+------------------+ CCA Prox  50      7                                   intimal thickening +----------+--------+--------+--------+--------------------+------------------+ CCA Distal71      0                                   intimal thickening +----------+--------+--------+--------+--------------------+------------------+ ICA Prox  218     0       1-39%   diffuse and calcific                   +----------+--------+--------+--------+--------------------+------------------+ ICA Mid   140     14      1-39%   calcific                               +----------+--------+--------+--------+--------------------+------------------+ ICA Distal75      9                                                      +----------+--------+--------+--------+--------------------+------------------+  ECA       210     0                                                      +----------+--------+--------+--------+--------------------+------------------+ +----------+--------+-------+--------+-------------------+           PSV cm/sEDV cmsDescribeArm Pressure (mmHG)  +----------+--------+-------+--------+-------------------+ Subclavian160                                        +----------+--------+-------+--------+-------------------+ +---------+--------+--+--------+-+----------+ VertebralPSV cm/s81EDV cm/s0Retrograde +---------+--------+--+--------+-+----------+  Left Carotid Findings: +----------+--------+--------+--------+------------------+------------------+           PSV cm/sEDV cm/sStenosisPlaque DescriptionComments           +----------+--------+--------+--------+------------------+------------------+ CCA Prox  63      6                                 intimal thickening +----------+--------+--------+--------+------------------+------------------+ CCA Distal101     7                                 intimal thickening +----------+--------+--------+--------+------------------+------------------+ ICA Prox  63      6       1-39%   calcific and focal                   +----------+--------+--------+--------+------------------+------------------+ ICA Distal81      11                                                   +----------+--------+--------+--------+------------------+------------------+ ECA       187     0                                                    +----------+--------+--------+--------+------------------+------------------+ +----------+--------+--------+--------+-------------------+           PSV cm/sEDV cm/sDescribeArm Pressure (mmHG) +----------+--------+--------+--------+-------------------+ Subclavian226     0                                   +----------+--------+--------+--------+-------------------+ +---------+--------+---+--------+-+ VertebralPSV cm/s100EDV cm/s8 +---------+--------+---+--------+-+   Summary: Right Carotid: Velocities in the right ICA are consistent with a 1-39% stenosis. Left Carotid: Velocities in the left ICA are consistent with a 1-39% stenosis. Vertebrals:  Left  vertebral artery demonstrates antegrade flow. Right vertebral              artery demonstrates retrograde flow. Subclavians: Left subclavian artery was stenotic. Normal flow hemodynamics were              seen in bilateral subclavian arteries. *See table(s) above for measurements and observations.  Electronically signed by Eather Popp MD on 10/22/2023 at 10:33:01 AM.    Final    DG Abd 1 View Result Date: 10/21/2023 CLINICAL DATA:  Abdominal pain. EXAM:  ABDOMEN - 1 VIEW COMPARISON:  Radiographs 08/03/2023.  Abdominopelvic CT 10/22/2022. FINDINGS: Two supine views of the abdomen are submitted. There is a normal nonobstructive bowel gas pattern. No supine evidence of pneumoperitoneum or suspicious abdominal calcification. Mildly prominent stool within the left colon. Multilevel spondylosis with mild degenerative changes of both hips. No acute osseous findings are seen. IMPRESSION: No evidence of bowel obstruction or other acute abdominal process. Mildly prominent stool within the left colon. Electronically Signed   By: Elsie Perone M.D.   On: 10/21/2023 15:03   MR ANGIO HEAD WO CONTRAST Result Date: 10/19/2023 CLINICAL DATA:  Stroke, follow up EXAM: MRA NECK WITHOUT CONTRAST MRA HEAD WITHOUT CONTRAST TECHNIQUE: Angiographic images of the Circle of Willis were acquired using MRA technique without intravenous contrast. COMPARISON:  CTA head/neck May 6, 24. FINDINGS: MRA NECK FINDINGS Motion limited. Aortic arch: Great vessel origins are patent. Right carotid system: Patent with likely at least moderate stenosis of the proximal ICA. Motion limits assessment. Left carotid system: Motion limited evaluation without definite hemodynamically significant stenosis. Vertebral arteries: Absence of flow related signal in the right vertebral artery, suggesting occlusion or proximal high-grade stenosis. Left vertebral artery appears patent without significant stenosis. MRA HEAD FINDINGS Anterior circulation: Bilateral  intracranial ICAs, MCAs and ACAs are patent without proximal hemodynamically significant stenosis. Posterior circulation: Diminished but preserved flow related signal in the right intradural vertebral artery. Left intradural vertebral artery is patent without significant stenosis. Basilar artery and bilateral posterior cerebral arteries are patent without proximal hemodynamically significant stenosis. IMPRESSION: 1. Absence of flow related signal in the right vertebral artery, suggesting occlusion or proximal high-grade stenosis. Reconstitution intradurally. 2. 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 3. Given the above, consider CTA to better confirm and better assess. 4. No large vessel occlusion or proximal hemodynamically significant stenosis in the head. Electronically Signed   By: Gilmore GORMAN Molt M.D.   On: 10/19/2023 19:16   MR ANGIO NECK WO CONTRAST Result Date: 10/19/2023 CLINICAL DATA:  Stroke, follow up EXAM: MRA NECK WITHOUT CONTRAST MRA HEAD WITHOUT CONTRAST TECHNIQUE: Angiographic images of the Circle of Willis were acquired using MRA technique without intravenous contrast. COMPARISON:  CTA head/neck May 6, 24. FINDINGS: MRA NECK FINDINGS Motion limited. Aortic arch: Great vessel origins are patent. Right carotid system: Patent with likely at least moderate stenosis of the proximal ICA. Motion limits assessment. Left carotid system: Motion limited evaluation without definite hemodynamically significant stenosis. Vertebral arteries: Absence of flow related signal in the right vertebral artery, suggesting occlusion or proximal high-grade stenosis. Left vertebral artery appears patent without significant stenosis. MRA HEAD FINDINGS Anterior circulation: Bilateral intracranial ICAs, MCAs and ACAs are patent without proximal hemodynamically significant stenosis. Posterior circulation: Diminished but preserved flow related signal in the right  intradural vertebral artery. Left intradural vertebral artery is patent without significant stenosis. Basilar artery and bilateral posterior cerebral arteries are patent without proximal hemodynamically significant stenosis. IMPRESSION: 1. Absence of flow related signal in the right vertebral artery, suggesting occlusion or proximal high-grade stenosis. Reconstitution intradurally. 2. 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 3. Given the above, consider CTA to better confirm and better assess. 4. No large vessel occlusion or proximal hemodynamically significant stenosis in the head. Electronically Signed   By: Gilmore GORMAN Molt M.D.   On: 10/19/2023 19:16   MR BRAIN WO CONTRAST Result Date: 10/19/2023 CLINICAL DATA:  Neuro deficit, acute, stroke suspected. Numbness and weakness in the left lower extremity. Abnormal gait. Last known well at 7 p.m. yesterday. EXAM: MRI HEAD WITHOUT CONTRAST TECHNIQUE: Multiplanar, multiecho pulse sequences of the brain and surrounding structures were obtained without intravenous contrast. COMPARISON:  CT head without contrast 08/03/2023. MR head without contrast 11/27/2022. FINDINGS: Brain: Acute nonhemorrhagic cortical infarct is present in the medial right frontal lobe. T2 and FLAIR signal hyperintensities are associated. A 10 mm cortical infarct is present in the medial left parietal lobe. Punctate cortical infarct is present in the right precentral gyrus on image 39 of series 2. Additional punctate infarcts are present in the right frontal operculum and right middle frontal gyrus. Separate from the acute/subacute infarcts is a stable. Confluent periventricular and subcortical T2 hyperintensities, advanced for age. A late subacute infarct is present in the lateral right occipital lobe. No acute hemorrhage or mass lesion is present. The ventricles are proportionate to the degree of atrophy. No significant extraaxial fluid  collection is present. Vascular: Flow is present in the major intracranial arteries. Skull and upper cervical spine: The craniocervical junction is normal. Upper cervical spine is within normal limits. Marrow signal is unremarkable. Sinuses/Orbits: The paranasal sinuses and mastoid air cells are clear. Bilateral lens replacements are noted. Globes and orbits are otherwise unremarkable. IMPRESSION: 1. 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 2. 10 mm cortical infarct in the medial left parietal lobe. 3. Punctate cortical infarcts in the right precentral gyrus, right frontal operculum, and right middle frontal gyrus. 4. Late subacute infarct in the lateral right occipital lobe. 5. Stable background of confluent periventricular and subcortical T2 hyperintensities bilaterally, advanced for age. This likely reflects the sequela of chronic microvascular ischemia. These results were called by telephone at the time of interpretation on 10/19/2023 at 4:31 pm to provider [name] , who verbally acknowledged these results. Electronically Signed   By: Lonni Necessary M.D.   On: 10/19/2023 16:32     LABORATORY DATA:  I have reviewed the data as listed    Latest Ref Rng & Units 01/14/2024    9:43 AM 10/23/2023    3:29 AM 10/22/2023    4:40 AM  CBC  WBC 4.0 - 10.5 K/uL 7.0  7.4  9.4   Hemoglobin 12.0 - 15.0 g/dL 88.2  89.1  89.0   Hematocrit 36.0 - 46.0 % 36.2  33.5  33.1   Platelets 150 - 400 K/uL 546  532  512        Latest Ref Rng & Units 01/14/2024    9:43 AM 11/15/2023   11:27 AM 10/23/2023    3:29 AM  CMP  Glucose 70 - 99 mg/dL 810  734  787   BUN 8 - 23 mg/dL 31  23  27    Creatinine 0.44 - 1.00 mg/dL 8.22  8.34  8.48   Sodium 135 - 145 mmol/L 134  134  134   Potassium 3.5 - 5.1 mmol/L 4.1  5.0  4.2   Chloride 98 - 111 mmol/L 95  94  99   CO2 22 - 32 mmol/L 31  23  26    Calcium  8.9 - 10.3 mg/dL 9.9  9.9  8.7   Total Protein 6.5 - 8.1 g/dL 7.4     Total Bilirubin  0.0 - 1.2 mg/dL 1.0     Alkaline Phos 38 - 126 U/L 48     AST 15 - 41 U/L 23     ALT  0 - 44 U/L 17       Iron/TIBC/Ferritin/ %Sat    Component Value Date/Time   IRON 78 12/13/2021 1033   TIBC 341.6 12/13/2021 1033   FERRITIN 246.9 12/13/2021 1033   IRONPCTSAT 22.8 12/13/2021 1033

## 2024-01-14 NOTE — Assessment & Plan Note (Signed)
 #  Monoclonal lymphocytosis of unknown significance.  Labs are reviewed and discussed with patient. 12/26/21 peripheral blood flowcytometry showed CD5 positive monoclonal B cell lymphocytes, 17% of leukocytes, <5000/ul, phenotype is non specific.  10/10/2022 flowcytometry showed a partial CD5 positive monoclonal B-cell population detected, 15% of leukocytes, <5,000/ul.  Patient is asymptomatic, no constitutional symptoms. Recommend observation.

## 2024-01-15 ENCOUNTER — Other Ambulatory Visit: Payer: Self-pay

## 2024-01-15 NOTE — Patient Outreach (Signed)
 Complex Care Management   Visit Note  01/15/2024  Name:  Alexis Farmer MRN: [id number] DOB: 1940-11-11  Situation: Referral received for Complex Care Management related to Heart Failure and Diabetes with Complications I obtained verbal consent from Patient.  Visit completed with Patient   on the phone  Background:   Past Medical History:  Diagnosis Date   Acute on chronic heart failure with preserved ejection fraction (HFpEF) (HCC) 04/01/2023   Anxiety 09/13/2015   Arthritis    Atypical chest pain 01/15/2021   Blackhead 10/24/2021   Bradycardia 07/17/2016   Formatting of this note might be different from the original.  Last Assessment & Plan:   Found to be bradycardic at outside hospital. They stopped metoprolol  and verapamil  and heart rate has been normal since then. Echo appears to have been reassuring at the outside facility. Appears to be doing much better with regards to this. She'll continue to monitor for recurrent symptoms. 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  Last Assessment & Plan:   Continues to have issues with this.  She is following with a specialist and it sounds as though they are planning on an MRI.  Benign exam today.   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 Dennise Deal of this note might be different from the original.  Dr. Dennise      Last Assessment & Plan:   Recheck kidney function today.   Constipation 11/17/2019   Cough 01/25/2016   Formatting of this note might be different from the original.  Last Assessment & Plan:   Minimal nighttime cough. Improves when she washes her pillows consistently. Suspect allergies contributing. She'll monitor.   Diabetes mellitus without complication (HCC)    one elevated reading/ no treatment   Disorder of rotator cuff 06/24/2018   Diverticulitis    Dysuria 03/22/2017   Formatting of this  note might be different from the original.  Last Assessment & Plan:   Symptoms concerning for UTI.  Will check urinalysis.   Edema of foot 04/08/2014   Formatting of this note might be different from the original.  Last Assessment & Plan:   Chronic pedal edema. Suspect venous insufficiency. No orthopnea or shortness of breath. Advised elevation of her legs. Consider compression stockings in the future.   Elevated troponin 12/15/2020   Essential hypertension 04/08/2014   Formatting of this note might be different from the original.  Last Assessment & Plan:   Well-controlled on recheck.  Continue current regimen.   Fall 03/26/2018   Gastroenteritis 08/12/2021   Gastrointestinal hemorrhage 08/03/2015   GI bleed    High cholesterol    History of blood transfusion    Hyperglycemia due to type 2 diabetes mellitus (HCC) 03/11/2015   Hypertension    Hypertensive urgency 12/15/2020   Low back pain 04/08/2014   Morbid obesity (HCC) 04/08/2014   Formatting of this note might be different from the original.  Last Assessment & Plan:   Weight is stable. Discussed diet and exercise at length. Encouraged whatever exercise she can do. Given diet information.   Palpitations 12/15/2020   Peripheral edema 12/22/2019   Postmenopausal bleeding 07/17/2016   Formatting of this note might be different from the original.  Last Assessment & Plan:   Recent D&C. Following with gynecology. Benign findings. Monitor for recurrence.   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  Last Assessment & Plan:   Continues to have issues with this.  She is following with a specialist and it sounds as though they are planning on an MRI.  Benign exam today.   Recurrent falls 08/13/2019   Renal insufficiency    Skin cyst 10/24/2021   Stage 3a chronic kidney disease (HCC) 01/15/2021    Swelling of lower leg 07/19/2016   Vertigo 10/13/2015   Formatting of this note might be different from the original.  Last Assessment & Plan:   No recurrence. Discussed that meclizine  is an as needed medication and that she does not need to take this daily. She will continue to monitor.    Assessment: Patient Reported Symptoms:  Cognitive Cognitive Status: No symptoms reported Cognitive/Intellectual Conditions Management [RPT]: None reported or documented in medical history or problem list   Health Maintenance Behaviors: Annual physical exam Healing Pattern: Slow  Neurological Neurological Review of Symptoms: Other: [place] Neurological Symptoms/Conditions [RPT]: patient reporting neuropathy - RNCM will notify Neurological Management Strategies: Medication therapy, Routine screening Neurological Comment: occasional dizziness  HEENT HEENT Symptoms Reported: Tinnitus HEENT Management Strategies: Routine screening, Diet modification    Cardiovascular Cardiovascular Symptoms Reported: Swelling in legs or feet Does patient have uncontrolled Hypertension?: No Cardiovascular Management Strategies: Medication therapy, Routine screening Cardiovascular Comment: elevates feet as advised, denies symptoms of elevated BP and aware of sx requiring call to PCP and red flags for ED, resending Stroke Warning signs by mail  Respiratory Respiratory Symptoms Reported: Dry cough Additional Respiratory Details: occasional dry cough - benzonatate  as needed - no recent use Respiratory Management Strategies: Routine screening, Medication therapy  Endocrine Endocrine Symptoms Reported: No symptoms reported Is patient diabetic?: Yes Is patient checking blood sugars at home?: Yes List most recent blood sugar readings, include date and time of day: FBG 199 - states high since rehab advised to write FBG doen and share with provider for prn medications adjustment Endocrine Self-Management Outcome: 3 (uncertain)   Gastrointestinal Gastrointestinal Symptoms Reported: Not assessed      Genitourinary Genitourinary Symptoms Reported: Not assessed    Integumentary Integumentary Symptoms Reported: Not assessed    Musculoskeletal Musculoskelatal Symptoms Reviewed: Difficulty walking, Limited mobility, Joint pain, Muscle pain Musculoskeletal Management Strategies: Medication therapy Falls in the past year?: Yes (Recent fall with knot on head and black eye states evaluated in ED) Number of falls in past year: 2 or more Was there an injury with Fall?: Yes Fall Risk Category Calculator: 3 Patient Fall Risk Level: High Fall Risk Patient at Risk for Falls Due to: History of fall(s), Impaired balance/gait, Impaired mobility Fall risk Follow up: Falls evaluation completed, Falls prevention discussed  Psychosocial Psychosocial Symptoms Reported: Depression - if selected complete PHQ 2-9, Anger Additional Psychological Details: Missed SW appointment due to rehab - RNCM scheduled patient for 02/07/24 10 am with LCSW as requested by patient Behavioral Management Strategies: Support system Major Change/Loss/Stressor/Fears (CP): Medical condition, self Techniques to Cope with Loss/Stress/Change: Diversional activities Quality of Family Relationships: supportive Do you feel physically threatened by others?: No      01/06/2024   11:34 AM  Depression screen PHQ 2/9  Decreased Interest 3  Down, Depressed, Hopeless 0  PHQ - 2 Score 3  Altered sleeping 0  Tired, decreased energy 3  Change in appetite 0  Feeling bad or failure about yourself  0  Trouble  concentrating 0  Moving slowly or fidgety/restless 0  Suicidal thoughts 0  PHQ-9 Score 6  Difficult doing work/chores Somewhat difficult    Vitals:   01/15/24 1549  BP: 112/66    Medications Reviewed Today     Reviewed by Devra Lands, RN (Registered Nurse) on 01/15/24 at 1544  Med List Status: <None>   Medication Order Taking? Sig Documenting  Provider Last Dose Status Informant  acetaminophen  (TYLENOL ) 500 MG tablet [id number] Yes Take 500 mg by mouth every 6 (six) hours as needed for mild pain or headache. [provider]  Active Nursing Home Medication Administration Guide (MAG), Pharmacy Records  Ascorbic Acid (VITAMIN C PO) [id number] Yes Take 1 tablet by mouth daily. [provider]  Active Nursing Home Medication Administration Guide (MAG), Pharmacy Records  aspirin  EC 81 MG tablet [id number] Yes Take 81 mg by mouth daily. Swallow whole. [provider]  Active Nursing Home Medication Administration Guide [facility]), Pharmacy Records           Med Note LYELL, REENA KANDICE Debar Jul 10, 2022 10:42 AM)    benzonatate  (TESSALON ) 200 MG capsule [id number] Yes Take 200 mg by mouth 3 (three) times daily as needed for cough. [provider]  Active Nursing Home Medication Administration Guide (MAG), Pharmacy Records  blood glucose meter kit and supplies KIT [id number] Yes Accu chek, Dx code E11.65, check 3 times daily Maribeth Camellia KANDICE, MD  Active Nursing Home Medication Administration Guide (MAG), Pharmacy Records  Blood Pressure KIT [id number] Yes 1 Device by Does not apply route daily. McLean-Scocuzza, Randine SAILOR, MD  Active Nursing Home Medication Administration Guide (MAG), Pharmacy Records  carvedilol  (COREG ) 6.25 MG tablet [id number] Yes Take 1 tablet (6.25 mg total) by mouth 2 (two) times daily with a meal. Dorinda Drue DASEN, MD  Active Nursing Home Medication Administration Guide (MAG), Pharmacy Records  cloNIDine  (CATAPRES ) 0.1 MG tablet [id number] Yes Take 0.1 mg by mouth 2 (two) times daily. [provider]  Active   gabapentin  (NEURONTIN ) 100 MG capsule [id number] Yes Take 100 mg by mouth 2 (two) times daily. [provider]  Active   gabapentin  (NEURONTIN ) 300 MG capsule [id number] Yes Take 300 mg by mouth at bedtime. [provider]  Active Nursing Home Medication Administration Guide  (MAG), Pharmacy Records  glucose blood (ACCU-CHEK GUIDE TEST) test strip [id number] Yes Use as instructed Narendra, Nischal, MD  Active   hydrALAZINE  (APRESOLINE ) 100 MG tablet [id number] Yes TAKE 1 TABLET BY MOUTH 4 TIMES DAILY. MUST KEEP UPCOMING APPOINTMENT ON 10/21/23 FOR FUTURE REFILLS. Dorinda Drue DASEN, MD  Active   insulin  NPH Human (NOVOLIN N) 100 UNIT/ML injection [id number] Yes Inject 15-20 Units into the skin 2 (two) times daily before a meal. Inject 20 units in the morning an 15 units in the evening. [provider]  Active Nursing Home Medication Administration Guide (MAG), Pharmacy Records  Insulin  Syringe-Needle U-100 (BD INSULIN  SYRINGE U/F) 30G X 1/2 0.5 ML MISC [id number] Yes USE UP TO 5X PER DAY WITH INSULIN  2X (NPH) AND 3X (REGULAR) Hope Merle, MD  Active Nursing Home Medication Administration Guide (MAG), Pharmacy Records  Insulin  Syringe-Needle U-100 30G X 1/2 0.5 ML MISC [id number] Yes Take as directed Onesimo Claude, MD  Active   ipratropium-albuterol  (DUONEB) 0.5-2.5 (3) MG/3ML SOLN [id number] Yes Take 3 mLs by nebulization 4 (four) times daily. Take scheduled 4 times daily until wheezing and shortness of breath from RSV improves. Then take as needed. [provider]  Active Pharmacy Records, Nursing Home Medication Administration Guide (MAG)           Med Note BEVERLEE, MERLYNN   Sun Sep 29, 2023 10:37 PM) Pt states worsens symptoms  meclizine  (ANTIVERT ) 12.5 MG tablet [id number] Yes Take 1 tablet (12.5 mg total) by mouth 3 (three) times daily as needed for dizziness. Onesimo Claude, MD  Active   Multiple Vitamins-Minerals (CENTRUM SILVER 50+WOMEN) TABS [id number] Yes Take 1 tablet by mouth daily with breakfast. [provider]  Active Nursing Home Medication Administration Guide (MAG), Pharmacy Records  nystatin  (MYCOSTATIN /NYSTOP ) powder [id number] Yes Apply 1 Application topically 2 (two) times daily as needed. Narendra, Nischal, MD  Active   rosuvastatin   (CRESTOR ) 20 MG tablet [id number] Yes TAKE 1 TABLET BY MOUTH EVERY DAY McLean-Scocuzza, Randine SAILOR, MD  Active Nursing Home Medication Administration Guide (MAG), Pharmacy Records           Med Note GROVER BURNARD GORMAN Pablo Apr 01, 2023  1:08 PM)    senna-docusate (SENOKOT-S) 8.6-50 MG tablet [id number] Yes Take 1 tablet by mouth 2 (two) times daily. Dorinda Drue DASEN, MD  Active Nursing Home Medication Administration Guide (MAG), Pharmacy Records  spironolactone  (ALDACTONE ) 25 MG tablet [id number] Yes Take 25 mg by mouth 3 (three) times a week. Monday Wednesday and friday [provider]  Active Nursing Home Medication Administration Guide [facility]), Pharmacy Records           Med Note BEVERLEE MERLYNN Repress Sep 29, 2023 10:37 PM) Taking Mondays, Wednesdays, and fridays  torsemide  (DEMADEX ) 20 MG tablet [id number] Yes Take 1.5 tablets (30 mg total) by mouth daily. [place] A, FNP  Active   traMADol  (ULTRAM ) 50 MG tablet [id number] Yes Take 25 mg by mouth every 8 (eight) hours as needed. [provider]  Active   traZODone  (DESYREL ) 50 MG tablet [id number] Yes Take 1 tablet (50 mg total) by mouth at bedtime as needed for sleep. Dorinda Drue DASEN, MD  Active Nursing Home Medication Administration Guide [facility]), Pharmacy Records  Med List Note Reinhold, Recardo HERO, CPhT 10/19/23 1104): [facility]            Recommendation:   PCP Follow-up Continue Current Plan of Care  Follow Up Plan:   Telephone follow-up in 1 month  Nestora Duos, MSN, RN [facility]  [facility], [facility] RN Care Manager Direct Dial: [phone] Fax: [phone]

## 2024-01-15 NOTE — Patient Instructions (Addendum)
 Visit Information  Thank you for taking time to visit with me today. Please don't hesitate to contact me if I can be of assistance to you before our next scheduled appointment.  Your next care management appointment is by telephone on 02/12/24 at 10:00 am  Telephone follow-up in 1 month  Please call the care guide team at [phone] if you need to cancel, schedule, or reschedule an appointment.   Please call the Suicide and Crisis Lifeline: 988 call the [place]  National Suicide Prevention Lifeline: 586-194-3912 or TTY: 323 294 7528 TTY 306-788-5009) to talk to a trained counselor call 1-800-273-TALK (toll free, 24 hour hotline) go to [facility] [address], [place] [phone]) call 911 if you are experiencing a Mental Health or Behavioral Health Crisis or need someone to talk to.  Nestora Duos, MSN, RN [place]  [facility], [facility] RN Care Manager Direct Dial: [phone] Fax: [phone]    Fall Prevention in the Home, Adult Falls can cause injuries and affect people of all ages. There are many simple things that you can do to make your home safe and to help prevent falls. If you need it, ask for help making these changes. What actions can I take to prevent falls? General information Use good lighting in all rooms. Make sure to: Replace any light bulbs that burn out. Turn on lights if it is dark and use night-lights. Keep items that you use often in easy-to-reach places. Lower the shelves around your home if needed. Move furniture so that there are clear paths around it. Do not keep throw rugs or other things on the floor that can make you trip. If any of your floors are uneven, fix them. Add color or contrast paint or tape to clearly mark and help you see: Grab bars or handrails. First and last steps of staircases. Where the edge of each step is. If you use a ladder or stepladder: Make sure  that it is fully opened. Do not climb a closed ladder. Make sure the sides of the ladder are locked in place. Have someone hold the ladder while you use it. Know where your pets are as you move through your home. What can I do in the bathroom?     Keep the floor dry. Clean up any water that is on the floor right away. Remove soap buildup in the bathtub or shower. Buildup makes bathtubs and showers slippery. Use non-skid mats or decals on the floor of the bathtub or shower. Attach bath mats securely with double-sided, non-slip rug tape. If you need to sit down while you are in the shower, use a non-slip stool. Install grab bars by the toilet and in the bathtub and shower. Do not use towel bars as grab bars. What can I do in the bedroom? Make sure that you have a light by your bed that is easy to reach. Do not use any sheets or blankets on your bed that hang to the floor. Have a firm bench or chair with side arms that you can use for support when you get dressed. What can I do in the kitchen? Clean up any spills right away. If you need to reach something above you, use a sturdy step stool that has a grab bar. Keep electrical cables out of the way. Do not use floor polish or wax that makes floors slippery. What can I do with my stairs? Do not leave anything on the stairs. Make sure that you  have a light switch at the top and the bottom of the stairs. Have them installed if you do not have them. Make sure that there are handrails on both sides of the stairs. Fix handrails that are broken or loose. Make sure that handrails are as long as the staircases. Install non-slip stair treads on all stairs in your home if they do not have carpet. Avoid having throw rugs at the top or bottom of stairs, or secure the rugs with carpet tape to prevent them from moving. Choose a carpet design that does not hide the edge of steps on the stairs. Make sure that carpet is firmly attached to the stairs. Fix any  carpet that is loose or worn. What can I do on the outside of my home? Use bright outdoor lighting. Repair the edges of walkways and driveways and fix any cracks. Clear paths of anything that can make you trip, such as tools or rocks. Add color or contrast paint or tape to clearly mark and help you see high doorway thresholds. Trim any bushes or trees on the main path into your home. Check that handrails are securely fastened and in good repair. Both sides of all steps should have handrails. Install guardrails along the edges of any raised decks or porches. Have leaves, snow, and ice cleared regularly. Use sand, salt, or ice melt on walkways during winter months if you live where there is ice and snow. In the garage, clean up any spills right away, including grease or oil spills. What other actions can I take? Review your medicines with your health care provider. Some medicines can make you confused or feel dizzy. This can increase your chance of falling. Wear closed-toe shoes that fit well and support your feet. Wear shoes that have rubber soles and low heels. Use a cane, walker, scooter, or crutches that help you move around if needed. Talk with your provider about other ways that you can decrease your risk of falls. This may include seeing a physical therapist to learn to do exercises to improve movement and strength. Where to find more information Centers for Disease Control and Prevention, [employer]: TonerPromos.no [employer] on Aging: BaseRingTones.pl National Institute on Aging: BaseRingTones.pl Contact a health care provider if: You are afraid of falling at home. You feel weak, drowsy, or dizzy at home. You fall at home. Get help right away if you: Lose consciousness or have trouble moving after a fall. Have a fall that causes a head injury. These symptoms may be an emergency. Get help right away. Call 911. Do not wait to see if the symptoms will go away. Do not drive yourself to the  hospital. This information is not intended to replace advice given to you by your health care provider. Make sure you discuss any questions you have with your health care provider. Document Revised: 02/05/2022 Document Reviewed: 02/05/2022 Elsevier Patient Education  2024 Elsevier Inc.  Heart Failure Action Plan A heart failure action plan helps you know what to do when you have symptoms of heart failure. Your action plan is a color-coded plan that lists the symptoms to watch for and indicates what actions to take. If you have symptoms in the green zone, you're doing well. If you have symptoms in the yellow zone, you're having problems. If you have symptoms in the red zone, you need medical care right away. Follow the plan that was created by you and your health care provider. Review your plan each time you visit  your provider. Green zone These signs mean you're doing well and can continue what you're doing: You don't have new or worsening shortness of breath. You have very little swelling or no new swelling. Your weight is stable (no gain or loss). You have a normal activity level. You don't have chest pain or any other new symptoms. Yellow zone These signs and symptoms mean your condition may be getting worse and you should make some changes: You have trouble breathing when you're active. You have swelling in your feet or legs or have discomfort in your belly. You gain 2-3 lb (0.9-1.4 kg) in 24 hours, or 5 lb (2.3 kg) in a week. This amount may be more or less depending on your condition. You get tired easily. You have trouble sleeping. You have a dry cough. If you have any of these symptoms: Contact your provider within the next day. Your provider may adjust your medicines. Red zone These signs and symptoms mean you should get medical help right away: You have trouble breathing when resting or cannot lie flat and you need to raise your head to help you breathe. You have a dry  cough that's getting worse. You have swelling or pain in your feet or legs or discomfort in your belly that's getting worse. You suddenly gain more than 2-3 lb (0.9-1.4 kg) in 24 hours, or more than 5 lb (2.3 kg) in a week. This amount may be more or less depending on your condition. You have trouble staying awake or you feel confused. You don't have an appetite. You have worsening sadness or depression. These symptoms may be an emergency. Call 911 right away. Do not wait to see if the symptoms will go away. Do not drive yourself to the hospital. Follow these instructions at home: Take medicines only as told. Eat a heart-healthy diet. Work with a dietitian to create an eating plan that's best for you. Weigh yourself each day. Your target weight is __________ lb (__________ kg). Call your provider if you gain more than __________ lb (__________ kg) in 24 hours, or more than __________ lb (__________ kg) in a week. Health care provider name: _____________________________________________________ Health care provider phone number: _____________________________________________________ Where to find more information American Heart Association: heart.org This information is not intended to replace advice given to you by your health care provider. Make sure you discuss any questions you have with your health care provider. Document Revised: 01/17/2023 Document Reviewed: 01/17/2023 Elsevier Patient Education  2024 Elsevier Inc.  Warning Signs of a Stroke A stroke is a Midwife. It should be treated right away. A stroke happens when there is not enough blood flow to the brain. A stroke can lead to brain damage and death. But if a person gets treated right away, they have a better chance of surviving and recovering. It is very important to recognize the symptoms of a stroke. What types of strokes are there? There are two main types of strokes: Ischemic stroke. This is the most common type.  It happens when a blood vessel that sends blood to the brain is blocked. Hemorrhagic stroke. This happens when there is bleeding in the brain. This may be from a blood vessel leaking or bursting. A transient ischemic attack (TIA) causes the same symptoms as a stroke. But the symptoms go away quickly and do not cause lasting damage to the brain. TIAs still need to be treated right away. They are also a sign that you are at higher risk for  a stroke. What are the warning signs of a stroke? The symptoms of a stroke may differ based on the part of the brain that is involved. Symptoms often happen all of a sudden. BE FAST symptoms BE FAST is an easy way to remember the main warning signs of a stroke: B - Balance. Signs are dizziness, sudden trouble walking, or loss of balance. E - Eyes. Signs are trouble seeing or a sudden change in vision. F - Face. Signs are sudden weakness or numbness of the face, or the face or eyelid drooping on one side. A - Arms. Signs are weakness or numbness in an arm. This happens suddenly and usually on one side of the body. S - Speech. Signs are sudden trouble speaking, slurred speech, or trouble understanding what people say. T - Time. Time to call emergency services. Write down what time symptoms started. A stroke may be happening even if only one BE FAST symptom is present. Other signs of a stroke Some less common signs of a stroke include: A sudden, severe headache with no known cause. Nausea or vomiting. Seizure. These symptoms may be an emergency. Get help right away. Call 911. Do not wait to see if the symptoms will go away. Do not drive yourself to the hospital.  This information is not intended to replace advice given to you by your health care provider. Make sure you discuss any questions you have with your health care provider. Document Revised: 03/19/2022 Document Reviewed: 03/19/2022 Elsevier Patient Education  2024 [employer].

## 2024-01-16 ENCOUNTER — Telehealth: Payer: Self-pay | Admitting: *Deleted

## 2024-01-16 NOTE — Telephone Encounter (Signed)
 Copied from CRM #[id number]. Topic: General - Other >> Jan 16, 2024 10:26 AM Cherylann RAMAN wrote: Reason for CRM: Gill with [facility] called to report that patient is reporting a 10/10 pain in her whole body. Pain has been going on for more than a year and close to 5 years. Patient reports just tolerating the pain. Patient states provider already knows and is unable to give her anything but Gabapentin  and Tylenol .

## 2024-01-17 LAB — COMP PANEL: LEUKEMIA/LYMPHOMA: Immunophenotypic Profile: 16

## 2024-01-17 NOTE — Telephone Encounter (Signed)
 Left message to call the office back regarding Dr. Yates message.

## 2024-01-20 NOTE — Telephone Encounter (Signed)
 Spoke to Patient and she is still having pain and it is bad today. Patient states she does not wish to come in due to she can not take anything but Tylenol . Patient politely said she has been in pain for a long time and some days are worse than others but she is okay.

## 2024-01-22 ENCOUNTER — Telehealth: Payer: Self-pay

## 2024-01-22 ENCOUNTER — Ambulatory Visit: Admitting: Internal Medicine

## 2024-01-22 ENCOUNTER — Ambulatory Visit: Payer: Self-pay | Admitting: *Deleted

## 2024-01-22 NOTE — Telephone Encounter (Signed)
 Copied from CRM [id number]. Topic: Clinical - Home Health Verbal Orders >> Jan 22, 2024 11:00 AM Graeme ORN wrote: Caller/Agency: [facility] Callback Number: [phone] Service Requested: Occupational Therapy Frequency: 1 time a week for 4 weeks, following week a phone call, then one time per week for 1 week  Any new concerns about the patient? Yes 8/10 pain neck down to legs

## 2024-01-22 NOTE — Telephone Encounter (Signed)
 Called [facility] per Dr. Narendra and gave verbal orders for occupational therapy.

## 2024-01-22 NOTE — Telephone Encounter (Unsigned)
 Copied from CRM #[id number]. Topic: Clinical - Medication Prior Auth >> Jan 22, 2024  4:10 PM Gennette ORN wrote: Reason for CRM: [facility] [phone] ask for [place] office when you call back. She stated patient got hospital bed on 09/25/2023 she needs MPI number on the form. The orginially 11/27/23 but it doesn't have MPI number. She needed the dates changed to 09/25/23,  initialed, and having a MPI on it.

## 2024-01-22 NOTE — Telephone Encounter (Signed)
 Summary: Pain   Occupational therapy called for orders reported patient has pain 8/10 neck to leg. She was no longer with patient.     Attempted to contact Dacharia for follow up- no answer- left message that office was reaching out regarding needed orders for patient. See message in chart

## 2024-01-23 NOTE — Telephone Encounter (Signed)
 Called Reena at [employer] and let her know that Dr. Hope is no longer at this office and this Patient has a TOC appointment on 02/06/24. The office can revisit this issue on 02/06/24 per Shanda.

## 2024-01-29 ENCOUNTER — Telehealth: Payer: Self-pay

## 2024-01-29 NOTE — Telephone Encounter (Signed)
 Copied from CRM #[id number]. Topic: General - Other >> Jan 29, 2024  1:52 PM Burnard DEL wrote: Reason for CRM: Andrea Mose from [facility] called to report that patient has some congestion in her lungs. Her pain level was at a 8 in her legs. OT therapist melanie wanted to have this reported in case provider thinks that patient may need to come isn't sooner than TOC on 21st. Also informed daughter to call and make appointment if needed.

## 2024-01-30 ENCOUNTER — Ambulatory Visit (INDEPENDENT_AMBULATORY_CARE_PROVIDER_SITE_OTHER): Admitting: Podiatry

## 2024-01-30 ENCOUNTER — Encounter: Payer: Self-pay | Admitting: Podiatry

## 2024-01-30 DIAGNOSIS — B351 Tinea unguium: Secondary | ICD-10-CM

## 2024-01-30 DIAGNOSIS — E1142 Type 2 diabetes mellitus with diabetic polyneuropathy: Secondary | ICD-10-CM

## 2024-01-30 DIAGNOSIS — I739 Peripheral vascular disease, unspecified: Secondary | ICD-10-CM | POA: Diagnosis not present

## 2024-01-30 DIAGNOSIS — M79675 Pain in left toe(s): Secondary | ICD-10-CM

## 2024-01-30 DIAGNOSIS — M79674 Pain in right toe(s): Secondary | ICD-10-CM | POA: Diagnosis not present

## 2024-01-30 NOTE — Progress Notes (Signed)
 Subjective:  Patient ID: Alexis Farmer, female    DOB: Nov 08, 1940,  MRN: [id number]  Shadavia Dampier presents to clinic today for at risk footcare. Patient has h/o diabetes, neuropathy and PAD and is seen for  and painful thick toenails that are difficult to trim. Pain interferes with ambulation. Aggravating factors include wearing enclosed shoe gear. Pain is relieved with periodic professional debridement.  Chief Complaint  Patient presents with   [facility]    Rm2 Diabetic foot care/ Dr. Hope last visit March 2025/ A1C 8.7   New problem(s): None.   She will be seeing her new PCP, Dr. Luke Shade on 02/06/2024.  Allergies  Allergen Reactions   Celexa  [Citalopram ]     Diarrhea upset stomach    Jardiance  [Empagliflozin ] Other (See Comments)    Reaction not recalled   Norvasc  [Amlodipine ]     Leg edema   Tape Other (See Comments)    Leaves the skin raw if left on for a period of time- tolerates paper tape   Penicillin V Rash   Penicillin V Potassium Rash   Penicillins Rash    Has patient had a PCN reaction causing immediate rash, facial/tongue/throat swelling, SOB or lightheadedness with hypotension: Yes Has patient had a PCN reaction causing severe rash involving mucus membranes or skin necrosis: No Has patient had a PCN reaction that required hospitalization No Has patient had a PCN reaction occurring within the last 10 years: Yes If all of the above answers are NO, then may proceed with Cephalosporin use.     Review of Systems: Negative except as noted in the HPI.  Objective: No changes noted in today's physical examination. There were no vitals filed for this visit. Alexis Farmer is a pleasant 83 y.o. female obese in NAD. AAO x 3.  Vascular Examination: CFT <4 seconds b/l. DP pulses diminished b/l. PT pulses diminished b/l. Digital hair absent. Skin temperature gradient warm to warm b/l. No ischemia or gangrene. No cyanosis or clubbing noted b/l. +2 pitting edema  BLE.   Neurological Examination: Sensation grossly intact b/l with 10 gram monofilament. Vibratory sensation diminished b/l.  Dermatological Examination: Pedal skin thin, shiny and atrophic b/l. No open wounds. No interdigital macerations.   Toenails 1-5 b/l thick, discolored, elongated with subungual debris and pain on dorsal palpation.   No hyperkeratotic nor porokeratotic lesions present on today's visit.  Musculoskeletal Examination: Muscle strength 4/5 to all lower extremity muscle groups bilaterally. Pes planus deformity noted bilateral LE. Utilizes wheelchair for mobility assistance.  Radiographs: None  Assessment/Plan: 1. Pain due to onychomycosis of toenails of both feet   2. PAD (peripheral artery disease) (HCC)   3. Diabetic peripheral neuropathy associated with type 2 diabetes mellitus [facility])    Consent given for treatment. Patient examined. All patient's and/or POA's questions/concerns addressed on today's visit. Toenails 1-5 debrided in length and girth without incident. Continue foot and shoe inspections daily. Monitor blood glucose per PCP/Endocrinologist's recommendations. Continue soft, supportive shoe gear daily. Report any pedal injuries to medical professional. Call office if there are any questions/concerns. -Patient/POA to call should there be question/concern in the interim.   Return in about 3 months (around 05/01/2024).  Delon LITTIE Merlin, DPM      [place] LOCATION: [address]  [place], [place] [address]                   Office [phone]   [facility] LOCATION: [address] [place], [place] [address] Office [phone]

## 2024-01-31 ENCOUNTER — Telehealth: Payer: Self-pay | Admitting: Cardiovascular Disease

## 2024-01-31 ENCOUNTER — Encounter: Payer: Self-pay | Admitting: Cardiovascular Disease

## 2024-01-31 NOTE — Telephone Encounter (Signed)
 Patients granddaughter called in due to patient having left leg swelling. She then handed the phone to the patient. Patient denies any pain, no redness, and it has been this way for years. The occupational therapist which saw her today recommended they call us  to see if we need to increase her torsemide  due to left leg swelling and fluid on her lungs. Patient then stated she does not want to increase anything that can affect her kidneys and this has been an issue for years. Inquired if she weighs daily and she does not. Educated on fluid intake and foods high in sodium. Patient then stated she does not want anything done. It has been the same with no changes. Granddaughter in background states that the therapist called her with these concerns but again the patient states she doesn't want anything done. Instructed to try compression socks/hose, elevation of the feet when sitting, and again to limit sodium and fluids. Inquired if she has called Ellouise Class NP office and they did not. Advised that I will send this to Dr. Gollan for review.

## 2024-01-31 NOTE — Telephone Encounter (Signed)
 Error

## 2024-01-31 NOTE — Telephone Encounter (Signed)
 Left voicemail message to call back

## 2024-01-31 NOTE — Telephone Encounter (Signed)
 Pt c/o swelling/edema: STAT if pt has developed SOB within 24 hours  If swelling, where is the swelling located?   Left leg  How much weight have you gained and in what time span?   Unknown  Have you gained 2 pounds in a day or 5 pounds in a week?   Unknown  Do you have a log of your daily weights (if so, list)?  No  Are you currently taking a fluid pill?   Yes - Torsemide   Are you currently SOB?   No  Have you traveled recently in a car or plane for an extended period of time?   No  Granddaughter Adline) stated patient's occupational therapist noted patient had fluid on her lungs.  Caller wants to know if patient will need to increased her fluid medication.

## 2024-02-03 ENCOUNTER — Telehealth: Payer: Self-pay | Admitting: *Deleted

## 2024-02-03 NOTE — Progress Notes (Signed)
 Attempted to call Patient and phone just kept ringing. No answer or voicemail.

## 2024-02-03 NOTE — Progress Notes (Unsigned)
 Complex Care Management Care Guide Note  02/03/2024 Name: Alexis Farmer MRN: [id number] DOB: 08-30-1940  Alexis Farmer is a 83 y.o. year old female who is a primary care patient of Pcp, No and is actively engaged with the care management team. I reached out to Ulysees Katz by phone today to assist with re-scheduling  with the RN Case Manager.  Follow up plan: Unsuccessful telephone outreach attempt made. A HIPAA compliant phone message was left for the patient providing contact information and requesting a return call.  Harlene Satterfield  [facility]  [facility], [facility] Guide  Direct Dial: [phone]  Fax [phone]

## 2024-02-03 NOTE — Telephone Encounter (Signed)
 Called patient and left detailed message per DPR. Informed patient of the following from Dr. Gollan.  I would agree with the patient If swelling is unilateral/1 leg, likely from a vein issue and extra torsemide  will not be of much benefit.  Would recommend leg elevation of the legs, compression hose if possible Continue current dose torsemide  given renal dysfunction Renal function is elevated but stable Thx TGollan  Asked patient to call back with any questions.

## 2024-02-04 ENCOUNTER — Telehealth: Payer: Self-pay | Admitting: *Deleted

## 2024-02-04 NOTE — Telephone Encounter (Signed)
 Copied from CRM [id number]. Topic: Clinical - Home Health Verbal Orders >> Feb 04, 2024  9:55 AM Carmell SAUNDERS wrote: Caller/Agency: Jill(PT)  from [facility]  Callback Number: [phone] Any new concerns about the patient? Yes. Patient's pain level was a 7/10 yesterday which she stated this is normal. Therapist just wanted to let the doctor know.

## 2024-02-04 NOTE — Telephone Encounter (Signed)
 Not a patient at our office, [facility].

## 2024-02-05 NOTE — Progress Notes (Signed)
 Complex Care Management Care Guide Note  02/05/2024 Name: Alexis Farmer MRN: [id number] DOB: Jun 10, 1941  Alexis Farmer is a 83 y.o. year old female who is a primary care patient of Pcp, No and is actively engaged with the care management team. I reached out to Ulysees Katz by phone today to assist with re-scheduling  with the RN Case Manager.  Follow up plan: Telephone appointment with complex care management team member scheduled for:  8/27  Harlene Satterfield  [facility]  [facility], [name] Guide  Direct Dial: [phone]  Fax [phone]

## 2024-02-05 NOTE — Progress Notes (Signed)
 Patient received scale and says she is making day by day. Patient has an appointment with Dr. Abbey on 02/06/24.

## 2024-02-06 ENCOUNTER — Ambulatory Visit

## 2024-02-06 VITALS — BP 120/80 | HR 67 | Temp 98.5°F | Ht 63.0 in | Wt 231.0 lb

## 2024-02-06 DIAGNOSIS — E1165 Type 2 diabetes mellitus with hyperglycemia: Secondary | ICD-10-CM | POA: Diagnosis not present

## 2024-02-06 DIAGNOSIS — I152 Hypertension secondary to endocrine disorders: Secondary | ICD-10-CM

## 2024-02-06 DIAGNOSIS — R634 Abnormal weight loss: Secondary | ICD-10-CM

## 2024-02-06 DIAGNOSIS — E782 Mixed hyperlipidemia: Secondary | ICD-10-CM

## 2024-02-06 DIAGNOSIS — G4700 Insomnia, unspecified: Secondary | ICD-10-CM

## 2024-02-06 DIAGNOSIS — E1129 Type 2 diabetes mellitus with other diabetic kidney complication: Secondary | ICD-10-CM

## 2024-02-06 DIAGNOSIS — E1159 Type 2 diabetes mellitus with other circulatory complications: Secondary | ICD-10-CM | POA: Diagnosis not present

## 2024-02-06 DIAGNOSIS — R5381 Other malaise: Secondary | ICD-10-CM

## 2024-02-06 DIAGNOSIS — J310 Chronic rhinitis: Secondary | ICD-10-CM | POA: Diagnosis not present

## 2024-02-06 DIAGNOSIS — G458 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: Secondary | ICD-10-CM

## 2024-02-06 DIAGNOSIS — F331 Major depressive disorder, recurrent, moderate: Secondary | ICD-10-CM | POA: Insufficient documentation

## 2024-02-06 DIAGNOSIS — Z794 Long term (current) use of insulin: Secondary | ICD-10-CM

## 2024-02-06 DIAGNOSIS — E1142 Type 2 diabetes mellitus with diabetic polyneuropathy: Secondary | ICD-10-CM

## 2024-02-06 DIAGNOSIS — I890 Lymphedema, not elsewhere classified: Secondary | ICD-10-CM

## 2024-02-06 DIAGNOSIS — I6523 Occlusion and stenosis of bilateral carotid arteries: Secondary | ICD-10-CM

## 2024-02-06 DIAGNOSIS — E871 Hypo-osmolality and hyponatremia: Secondary | ICD-10-CM

## 2024-02-06 DIAGNOSIS — Z8673 Personal history of transient ischemic attack (TIA), and cerebral infarction without residual deficits: Secondary | ICD-10-CM

## 2024-02-06 MED ORDER — GABAPENTIN 300 MG PO CAPS
300.0000 mg | ORAL_CAPSULE | Freq: Every day | ORAL | 1 refills | Status: DC
Start: 2024-02-06 — End: 2024-04-20

## 2024-02-06 MED ORDER — FLUTICASONE PROPIONATE 50 MCG/ACT NA SUSP: 1.0000 | Freq: Every day | NASAL | 1 refills | Status: DC

## 2024-02-06 NOTE — Assessment & Plan Note (Signed)
 Chronic.  Recommend reestablishing with vascular surgery.  Also reached out to patient's granddaughter to provide update on patient's health and need for vascular care, annual ultrasound of carotid arteries.

## 2024-02-06 NOTE — Progress Notes (Signed)
 Established Patient Office Visit TOC from Dr. Hope    Subjective  Patient ID: Alexis Farmer, female    DOB: [birth year]  Age: 83 y.o. 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 preserved ejection fraction (HFpEF) (HCC) (04/01/2023), Anxiety (09/13/2015), Arthritis, Atypical chest pain (01/15/2021), [place] (10/24/2021), Bradycardia (07/17/2016), Cervical spondylosis with radiculopathy (01/26/2017), Chickenpox, Chronic kidney disease, stage 2 (mild) (04/08/2014), Constipation (11/17/2019), Cough (01/25/2016), Diabetes mellitus without complication (HCC), Disorder of rotator cuff (06/24/2018), Diverticulitis, Dysuria (03/22/2017), Edema of foot (04/08/2014), Elevated troponin (12/15/2020), Essential hypertension (04/08/2014), Fall (03/26/2018), Gastroenteritis (08/12/2021), Gastrointestinal hemorrhage (08/03/2015), GI bleed, High cholesterol, History of blood transfusion, Hyperglycemia due to type 2 diabetes mellitus (HCC) (03/11/2015), Hypertension, Hypertensive urgency (12/15/2020), Low back pain (04/08/2014), Morbid obesity (HCC) (04/08/2014), Palpitations (12/15/2020), Peripheral edema (12/22/2019), Postmenopausal bleeding (07/17/2016), Primary hypertension (03/25/2020), Pure hypercholesterolemia (08/10/2020), Radiculopathy due to cervical spondylosis (01/26/2017), Recurrent falls (08/13/2019), Renal insufficiency, Skin cyst (10/24/2021), Stage 3a chronic kidney disease (HCC) (01/15/2021), Swelling of lower leg (07/19/2016), and Vertigo (10/13/2015).  HPI Patient presents for transfer of care from Dr. Hope.  -- Patient sustained ischemic stroke and was in hospital from 10/19/2023 to 10/24/2023 (nonhemorrhagic cortical infarcts in the right frontal and left parietal lobes). Patient was discharged to skilled nursing home for rehab.  Patient is now back to living at home.   She is in wheelchair today.  She is currently on Plavix  75 mg daily, aspirin  81 mg daily.  She is also on rosuvastatin  20 mg daily.  -- Uncontrolled type 2 diabetes:  Endocrinologist: Seeing [facility] endocrine department. For type II DM Cherilyn Debby Quivers, MD.  Last A1c 02/04/24: 9.7%.  Her last appointment with her endocrinologist was on 02/04/2024.  Documentation from that visit states patient is hesitant on adjusting insulin .  She expresses similar sentiment during her visit today as well.  Patient reports she can improve diet to help with diabetes as well. She established with podiatry and follows up with them regularly.  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  She established with Dr. Dennise at [facility].  Her last visit with him from 01/27/2024 states she is not a candidate for SGLT2 inhibitor due to high risk of incontinence and UTI risk. GFR 28, Cr 1.77, BUN 31 on 01/14/24.  -- Diastolic CHF, chronic bilateral lower leg edema:  -- Hypertension:  -- Hyperlipidemia:  Torsemide  30 mg daily (1-1/2 tablets of 20 mg ), Spironolactone  25 mg 3 times a week Carvedilol  6.25 mg twice daily Hydralazine  100 mg 4 times a day Patient medication list states she is on clonidine  0.1 mg twice daily however patient reports she has not been taking this medication.   She is recommended to wear compression stockings but does not use it regularly.  -- Bilateral carotid artery stenosis: Patient has seen vascular surgery in the past but has not continued to follow-up with them.   -- Patient reports she gets vertigo symptoms and takes meclizine  as needed.  She is currently not taking it she was seen by Dr. Onesimo on 01/06/2024 where she was prescribed with meclizine .   -- Monoclonal B-cell lymphocytosis of undetermined significance. F/U with Dr. Babara.   --It is extremely hard to get history from patient. She is afraid her medications might be taken away or adjusted against her  will.   --  She takes gabapentin  300 mg nightly for neuropathy.  She is on trazodone  50 mg as needed for assistance with sleep.  She has been prescribed lorazepam  1 mg in the past.  Last medication refill was on 09/03/2023 for 15 tablets and she has not been taking this medication.  She was prescribed tramadol  when she was in the hospital for lower leg pain but she is no longer on tramadol .  -- She reports of nasal congestion, runny/itchy nose.   -- She has history of chronic constipation and takes lactulose as needed which has been prescribed by her nephrologist to help with constipation.  Has tried MiraLAX  in the past but has not helped.  She also has Senokot prescribed during her hospital stay which she does not believe has helped with constipation.   ROS As per HPI    Objective:     BP 120/80   Pulse 67   Temp 98.5 F (36.9 C)   Ht 5' 3 (1.6 m)   Wt 231 lb (104.8 kg)   SpO2 96%   BMI 40.92 kg/m      02/06/2024    1:11 PM 01/06/2024   11:34 AM 01/01/2024   11:46 AM  Depression screen PHQ 2/9  Decreased Interest 3 3 3   Down, Depressed, Hopeless 0 0 3  PHQ - 2 Score 3 3 6   Altered sleeping 0 0 0  Tired, decreased energy 0 3 3  Change in appetite 0 0 1  Feeling bad or failure about yourself  0 0 0  Trouble concentrating 0 0 0  Moving slowly or fidgety/restless 0 0 0  Suicidal thoughts 0 0 0  PHQ-9 Score 3 6 10   Difficult doing work/chores Somewhat difficult Somewhat difficult Somewhat difficult      02/06/2024    1:12 PM 01/06/2024   11:34 AM 01/01/2024   11:51 AM 09/04/2022   11:27 AM  GAD 7 : Generalized Anxiety Score  Nervous, Anxious, on Edge 0 3 1 0  Control/stop worrying 0 3 2 0  Worry too much - different things 3 3 2  0  Trouble relaxing 0 3 0 0  Restless 0 0 0 0  Easily annoyed or irritable 0 3 1 0  Afraid - awful might happen 0 3 1 0  Total GAD 7 Score 3 18 7  0  Anxiety Difficulty Not difficult at all Somewhat difficult Not difficult at all Not difficult at  all      02/06/2024    1:11 PM 01/06/2024   11:34 AM 01/01/2024   11:46 AM  Depression screen PHQ 2/9  Decreased Interest 3 3 3   Down, Depressed, Hopeless 0 0 3  PHQ - 2 Score 3 3 6   Altered sleeping 0 0 0  Tired, decreased energy 0 3 3  Change in appetite 0 0 1  Feeling bad or failure about yourself  0 0 0  Trouble concentrating 0 0 0  Moving slowly or fidgety/restless 0 0 0  Suicidal thoughts 0 0 0  PHQ-9 Score 3 6 10   Difficult doing work/chores Somewhat difficult Somewhat difficult Somewhat difficult      02/06/2024    1:12 PM 01/06/2024   11:34 AM 01/01/2024   11:51 AM 09/04/2022   11:27 AM  GAD 7 : Generalized Anxiety Score  Nervous, Anxious, on Edge 0 3 1 0  Control/stop worrying 0 3 2 0  Worry too much - different things 3 3 2  0  Trouble relaxing 0 3 0 0  Restless 0 0 0 0  Easily annoyed or irritable 0 3 1 0  Afraid - awful might happen 0 3 1 0  Total GAD 7 Score 3 18 7  0  Anxiety Difficulty Not difficult at all Somewhat difficult Not difficult at all Not difficult at all   SDOH Screenings   Food Insecurity: No Food Insecurity (01/15/2024)  Housing: Unknown (02/04/2024)   Received from [facility]  Transportation Needs: No Transportation Needs (01/15/2024)  Utilities: Not At Risk (01/15/2024)  Alcohol Screen: Low Risk  (11/29/2022)  Depression (PHQ2-9): Low Risk  (02/06/2024)  Recent Concern: Depression (PHQ2-9) - Medium Risk (01/06/2024)  Financial Resource Strain: Medium Risk (10/03/2023)  Physical Activity: Inactive (10/03/2023)  Social Connections: Moderately Integrated (10/21/2023)  Stress: Stress Concern Present (11/29/2022)  Tobacco Use: Low Risk  (02/06/2024)     Physical Exam Constitutional:      Appearance: She is obese.  HENT:     Head: Normocephalic and atraumatic.     Nose: Congestion and rhinorrhea present.  Cardiovascular:     Rate and Rhythm: Normal rate.  Abdominal:     Palpations: Abdomen is soft.  Musculoskeletal:      Cervical back: Neck supple.     Right lower leg: Edema present.     Left lower leg: Edema present.  Neurological:     Mental Status: She is oriented to person, place, and time.     Comments: On a wheel chair  Psychiatric:        Mood and Affect: Mood normal.        No results found for any visits on 02/06/24.  The ASCVD Risk score (Arnett DK, et al., 2019) failed to calculate for the following reasons:   The 2019 ASCVD risk score is only valid for ages 14 to 28   Risk score cannot be calculated because patient has a medical history suggesting prior/existing ASCVD     Assessment & Plan:   Hypertension associated with diabetes (HCC) Assessment & Plan: - Blood pressure today is well-controlled today.  - Not on Clonidine , confirmed this with cardiology Clabe Class, NP) via secure chat.  - Continue f/u with cardiology.   Orders: -     AMB Referral [facility] Care Management  Chronic rhinitis Assessment & Plan: Start nasal Flonase  1 puff in each nostril daily.  Orders: -     Fluticasone  Propionate; Place 1 spray into both nostrils daily.  Dispense: 16 g; Refill: 1  Type 2 diabetes mellitus with other diabetic kidney complication (HCC) -     AMB Referral [facility] Care Management -     Gabapentin ; Take 1 capsule (300 mg total) by mouth at bedtime.  Dispense: 90 capsule; Refill: 1  Uncontrolled type 2 diabetes mellitus with hyperglycemia, with long-term current use of insulin  [facility]) Assessment & Plan: Managed by [facility]: Cherilyn Debby Quivers. Last A1c 02/04/24: 9.7%. Patient is hesitant on any medication adjustment at this time.  I discussed risk of hypoglycemia, diet improvement.  She still declines medication adjustment.   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  Recommend reestablishing with vascular surgery.  Also reached out to patient's granddaughter to provide update on patient's health and need for vascular care,  annual ultrasound of carotid arteries.   Physical deconditioning Assessment & Plan: Referral to RN case management to help with patient's need and reduce risk of recurrent hospitalization.  Patient has multiple comorbidities putting her at risk of continuing physical deconditioning.  It  is extremely hard to get medical history, what medications patient is currently taking due to concern about others adjusting her medications.  Close follow-up recommended.   Mixed hyperlipidemia Assessment & Plan: Continue rosuvastatin  20 mg daily.   Lymphedema Assessment & Plan: Elevate legs, wear compression stocking and reestablish care with vascular surgery.  This was shared with patient's granddaughter as well.   Hyponatremia Assessment & Plan: Pseudohyponatremia due to uncontrolled diabetes.   History of CVA (cerebrovascular accident) Assessment & Plan: Patient is wheelchair-bound.   Continue albuterol  antiplatelet therapy with Plavix , aspirin , continue rosuvastatin  20 mg.    Diabetic polyneuropathy associated with type 2 diabetes mellitus (HCC) Assessment & Plan: Continue gabapentin  300 mg at night.  Refill sent.   Bilateral carotid artery stenosis Assessment & Plan: Needs close follow-up with vascular surgery.  This was communicated to patient's granddaughter.  She has already been referred to vascular surgery by previous PCP as well.   Insomnia, unspecified type Assessment & Plan: Continue as needed trazodone  50 mg at bedtime.    I personally spent a total of 50 minutes in the care of the patient today including preparing to see the patient, getting/reviewing separately obtained history, performing a medically appropriate exam/evaluation, counseling and educating, referring and communicating with other health care professionals, documenting clinical information in the EHR, and coordinating care.  Return in about 3 months (around 05/08/2024) for Chronic follow up.   Luke Shade, MD

## 2024-02-06 NOTE — Assessment & Plan Note (Signed)
-   Blood pressure today is well-controlled today.  - Not on Clonidine , confirmed this with cardiology Clabe Class, NP) via secure chat.  - Continue f/u with cardiology.

## 2024-02-06 NOTE — Assessment & Plan Note (Signed)
 Pseudohyponatremia due to uncontrolled diabetes.

## 2024-02-06 NOTE — Assessment & Plan Note (Addendum)
 Referral to RN case management to help with patient's need and reduce risk of recurrent hospitalization.  Patient has multiple comorbidities putting her at risk of continuing physical deconditioning.  It is extremely hard to get medical history, what medications patient is currently taking due to concern about others adjusting her medications.  Close follow-up recommended.

## 2024-02-06 NOTE — Assessment & Plan Note (Signed)
 Continue gabapentin  300 mg at night.  Refill sent.

## 2024-02-06 NOTE — Assessment & Plan Note (Signed)
 Start nasal Flonase  1 puff in each nostril daily.

## 2024-02-06 NOTE — Assessment & Plan Note (Signed)
 Needs close follow-up with vascular surgery.  This was communicated to patient's granddaughter.  She has already been referred to vascular surgery by previous PCP as well.

## 2024-02-06 NOTE — Patient Instructions (Signed)
-   Start using nasal flonase , 1 puff in each nostril daily.  - Follow up with Dr. Abbey in 3 months.

## 2024-02-06 NOTE — Assessment & Plan Note (Signed)
 Patient is wheelchair-bound.   Continue albuterol  antiplatelet therapy with Plavix , aspirin , continue rosuvastatin  20 mg.

## 2024-02-06 NOTE — Assessment & Plan Note (Signed)
 Managed by [facility]: Alexis Farmer. Last A1c 02/04/24: 9.7%. Patient is hesitant on any medication adjustment at this time.  I discussed risk of hypoglycemia, diet improvement.  She still declines medication adjustment.

## 2024-02-06 NOTE — Assessment & Plan Note (Signed)
 Continue as needed trazodone  50 mg at bedtime.

## 2024-02-06 NOTE — Assessment & Plan Note (Signed)
 Continue rosuvastatin 20 mg daily

## 2024-02-06 NOTE — Assessment & Plan Note (Signed)
 Elevate legs, wear compression stocking and reestablish care with vascular surgery.  This was shared with patient's granddaughter as well.

## 2024-02-07 ENCOUNTER — Other Ambulatory Visit: Payer: Self-pay | Admitting: *Deleted

## 2024-02-07 NOTE — Patient Outreach (Signed)
 Phone call to patient for schedule appointment. Patient requested a call at a later date. Appointment rescheduled for 02/18/24 2:30 pm.   Lenn Mean, LCSW [place]  [facility], [facility] Licensed Clinical Social Worker  Direct Dial: [phone]

## 2024-02-11 ENCOUNTER — Telehealth: Payer: Self-pay | Admitting: *Deleted

## 2024-02-11 NOTE — Telephone Encounter (Signed)
 Noted. Talked with PT Kate Lesches on the phone who reports patient's baseline hip pain is 8/10. No new concerns. Continue home PT and update with new concerns/questions.    Luke Shade, MD

## 2024-02-11 NOTE — Telephone Encounter (Signed)
 Copied from CRM #[id number]. Topic: General - Other >> Feb 11, 2024 10:42 AM Diannia H wrote: Reason for CRM: Patient was seen for her pt and patient was complaining about her pain, level is a 8 from her left hip and down. Kate Lesches wants to inform the provider of what's going on. For additional questions or concerns she can be reached at [phone].

## 2024-02-12 ENCOUNTER — Other Ambulatory Visit: Payer: Self-pay

## 2024-02-12 NOTE — Patient Instructions (Signed)
 Visit Information  Thank you for taking time to visit with me today. Please don't hesitate to contact me if I can be of assistance to you before our next scheduled appointment.  Your next care management appointment is by telephone on 03/11/2024 at 11:00 am  Telephone follow-up in 1 month  Please call the care guide team at [phone] if you need to cancel, schedule, or reschedule an appointment.   Call [facility] [phone] to schedule appointment.  Please call the Suicide and Crisis Lifeline: 988 call the [place]  National Suicide Prevention Lifeline: 408-708-6577 or TTY: 307-572-8553 TTY (775)770-9047) to talk to a trained counselor call 1-800-273-TALK (toll free, 24 hour hotline) go to [facility] [address] [place] [phone]) call 911 if you are experiencing a Mental Health or Behavioral Health Crisis or need someone to talk to.  Nestora Duos, MSN, RN [place]  [facility], [facility] RN Care Manager Direct Dial: [phone] Fax: [phone]   Type 2 Diabetes Mellitus, Self-Care, Adult When you have type 2 diabetes (type 2 diabetes mellitus), you must make sure your blood sugar (glucose) stays in a healthy range. You can do this with: Nutrition. Exercise. Lifestyle changes. Medicines or insulin , if needed. Support from your doctors and others. What are the risks? Having type 2 diabetes can raise your risk for other long-term (chronic) health problems. You may get medicines to help prevent these problems. How to stay aware of your blood sugar  Check your blood sugar level every day, as often as told. Have your A1C (hemoglobin A1C) level checked two or more times a year. Have it checked more often if told. Your doctor will set personal treatment goals for you. In general, you should have these blood sugar levels: Before meals: 80-130 mg/dL (4.4-7.2 mmol/L). After meals: below 180 mg/dL  (10 mmol/L). A1C: less than 7%. How to manage high and low blood sugar Symptoms of high blood sugar High blood sugar is also called hyperglycemia. Know the symptoms of high blood sugar. These may include: More thirst. Hunger. Feeling very tired. Needing to pee (urinate) more often than normal. Seeing things blurry. Symptoms of low blood sugar Low blood sugar is also called hypoglycemia. This is when blood sugar is at or below 70 mg/dL (3.9 mmol/L). Symptoms may include: Hunger. Feeling worried or nervous (anxious). Feeling sweaty and cold to the touch (clammy). Being dizzy or light-headed. Feeling sleepy. A fast heartbeat. Feeling grouchy (irritable). Tingling or loss of feeling (numbness) around your mouth, lips, or tongue. Restless sleep. Diabetes medicines can cause low blood sugar. You are more at risk: While you exercise. After exercise. During sleep. When you are sick. When you skip meals or do not eat for a long time. Treating low blood sugar If you think you have low blood sugar, eat or drink something sugary right away. Keep 15 grams of a fast-acting carb (carbohydrate) with you all the time. Make sure your family and friends know how to treat you if you cannot treat yourself. Treating very low blood sugar Severe hypoglycemia is when your blood sugar is at or below 54 mg/dL (3 mmol/L). Severe hypoglycemia is an emergency. Get medical help right away. Call your local emergency services (911 in the [place] Do not wait to see if the symptoms will go away. Do not drive yourself to the hospital. You may need a glucagon shot if you have very low blood sugar and you cannot eat or drink. Have a family  member or friend learn how to check your blood sugar and how to give you a glucagon shot. Ask your doctor if you should have a kit for glucagon shots. Follow these instructions at home: Medicines Take prescribed insulin  or diabetes medicines as told by your health care provider. Do  not run out of insulin  or other medicines. Plan ahead. If you use insulin , change the amount you take based on how active you are and what foods you eat. Your doctor will tell you how to do this. Take over-the-counter and prescription medicines only as told by your doctor. Eating and drinking  Eat healthy foods. These include: Low-fat (lean) proteins. Complex carbs, such as whole grains. Fresh fruits and vegetables. Low-fat dairy products. Healthy fats. Meet with a food expert (dietitian) to make an eating plan. Follow instructions from your doctor about what you cannot eat or drink. Drink enough fluid to keep your pee (urine) pale yellow. Keep track of carbs that you eat. Read food labels and learn serving sizes of foods. Follow your sick-day plan when you cannot eat or drink as normal. Make this plan with your doctor so it is ready to use. Activity Exercise as told by your doctor. You may need to: Do stretching and strength exercises two or more times a week. Do 150 minutes or more of exercise each week that makes your heart beat faster and makes you sweat. Spread out your exercise over 3 or more days a week. Do not go more than 2 days in a row without exercise. Talk with your doctor before you start a new exercise. Your doctor may tell you to change: How much insulin  or medicines you take. How much food you eat. Lifestyle Do not smoke or use any products that contain nicotine or tobacco. If you need help quitting, ask your doctor. If you drink alcohol and your doctor says that it is safe for you: Limit how much you have to: 0-1 drink a day for women who are not pregnant. 0-2 drinks a day for men. Know how much alcohol is in your drink. In the [place] one drink equals one 12 oz bottle of beer (355 mL), one 5 oz glass of wine (148 mL), or one 1 oz glass of hard liquor (44 mL). Learn to deal with stress. If you need help, ask your doctor. Body care  Stay up to date with your shots  (immunizations). Have your eyes and feet checked by a doctor as often as told. Check your skin and feet every day. Check for cuts, bruises, redness, blisters, or sores. Brush your teeth and gums two times a day. Floss one or more times a day. Go to the dentist one or more times every 6 months. Stay at a healthy weight. General instructions Share your diabetes care plan with: Your work or school. People you live with. Carry a card or wear jewelry that says you have diabetes. Keep all follow-up visits. Questions to ask your doctor Do I need to meet with a certified expert in diabetes education and care? Where can I find a support group? Where to find more information For help and guidance and more information about diabetes, please go to: American Diabetes Association: www.diabetes.org American Association of Diabetes Care and Education Specialists: www.diabeteseducator.org International Diabetes Federation: DCOnly.dk Summary When you have type 2 diabetes, you must make sure your blood sugar (glucose) stays in a healthy range. You can do this with nutrition, exercise, medicines and insulin , and support from doctors  and others. Check your blood sugar every day, or as often as told. Having diabetes can raise your risk for other long-term health problems. You may get medicines to help prevent these problems. Share your diabetes management plan with people at work, school, and home. Keep all follow-up visits. This information is not intended to replace advice given to you by your health care provider. Make sure you discuss any questions you have with your health care provider. Document Revised: 08/29/2020 Document Reviewed: 08/29/2020 Elsevier Patient Education  2024 [employer]  Managing Depression, Adult Depression is a mental health condition that affects your thoughts, feelings, and actions. Being diagnosed with depression can bring you relief if you did not know why you have felt  or behaved a certain way. It could also leave you feeling overwhelmed. Finding ways to manage your symptoms can help you feel more positive about your future. How to manage lifestyle changes Being depressed is difficult. Depression can increase the level of everyday stress. Stress can make depression symptoms worse. You may believe your symptoms cannot be managed or will never improve. However, there are many things you can try to help manage your symptoms. There is hope. Managing stress  Stress is your body's reaction to life changes and events, both good and bad. Stress can add to your feelings of depression. Learning to manage your stress can help lessen your feelings of depression. Try some of the following approaches to reducing your stress (stress reduction techniques): Listen to music that you enjoy and that inspires you. Try using a meditation app or take a meditation class. Develop a practice that helps you connect with your spiritual self. Walk in nature, pray, or go to a place of worship. Practice deep breathing. To do this, inhale slowly through your nose. Pause at the top of your inhale for a few seconds and then exhale slowly, letting yourself relax. Repeat this three or four times. Practice yoga to help relax and work your muscles. Choose a stress reduction technique that works for you. These techniques take time and practice to develop. Set aside 5-15 minutes a day to do them. Therapists can offer training in these techniques. Do these things to help manage stress: Keep a journal. Know your limits. Set healthy boundaries for yourself and others, such as saying no when you think something is too much. Pay attention to how you react to certain situations. You may not be able to control everything, but you can change your reaction. Add humor to your life by watching funny movies or shows. Make time for activities that you enjoy and that relax you. Spend less time using electronics,  especially at night before bed. The light from screens can make your brain think it is time to get up rather than go to bed.  Medicines Medicines, such as antidepressants, are often a part of treatment for depression. Talk with your pharmacist or health care provider about all the medicines, supplements, and herbal products that you take, their possible side effects, and what medicines and other products are safe to take together. Make sure to report any side effects you may have to your health care provider. Relationships Your health care provider may suggest family therapy, couples therapy, or individual therapy as part of your treatment. How to recognize changes Everyone responds differently to treatment for depression. As you recover from depression, you may start to: Have more interest in doing activities. Feel more hopeful. Have more energy. Eat a more regular amount of  food. Have better mental focus. It is important to recognize if your depression is not getting better or is getting worse. The symptoms you had in the beginning may return, such as: Feeling tired. Eating too much or too little. Sleeping too much or too little. Feeling restless, agitated, or hopeless. Trouble focusing or making decisions. Having unexplained aches and pains. Feeling irritable, angry, or aggressive. If you or your family members notice these symptoms coming back, let your health care provider know right away. Follow these instructions at home: Activity Try to get some form of exercise each day, such as walking. Try yoga, mindfulness, or other stress reduction techniques. Participate in group activities if you are able. Lifestyle Get enough sleep. Cut down on or stop using caffeine, tobacco, alcohol, and any other harmful substances. Eat a healthy diet that includes plenty of vegetables, fruits, whole grains, low-fat dairy products, and lean protein. Limit foods that are high in solid fats, added  sugar, or salt (sodium). General instructions Take over-the-counter and prescription medicines only as told by your health care provider. Keep all follow-up visits. It is important for your health care provider to check on your mood, behavior, and medicines. Your health care provider may need to make changes to your treatment. Where to find support Talking to others  Friends and family members can be sources of support and guidance. Talk to trusted friends or family members about your condition. Explain your symptoms and let them know that you are working with a health care provider to treat your depression. Tell friends and family how they can help. Finances Find mental health providers that fit with your financial situation. Talk with your health care provider if you are worried about access to food, housing, or medicine. Call your insurance company to learn about your co-pays and prescription plan. Where to find more information You can find support in your area from: Anxiety and Depression Association of America (ADAA): adaa.org Mental Health [place]: mentalhealthamerica.net [employer] on Mental Illness: nami.org Contact a health care provider if: You stop taking your antidepressant medicines, and you have any of these symptoms: Nausea. Headache. Light-headedness. Chills and body aches. Not being able to sleep (insomnia). You or your friends and family think your depression is getting worse. Get help right away if: You have thoughts of hurting yourself or others. Get help right away if you feel like you may hurt yourself or others, or have thoughts about taking your own life. Go to your nearest emergency room or: Call 911. Call the National Suicide Prevention Lifeline at 414-115-2566 or 988. This is open 24 hours a day. Text the Crisis Text Line at 845 096 8315. This information is not intended to replace advice given to you by your health care provider. Make sure you discuss  any questions you have with your health care provider. Document Revised: 10/10/2021 Document Reviewed: 10/10/2021 Elsevier Patient Education  2024 [employer].

## 2024-02-12 NOTE — Patient Outreach (Signed)
 Complex Care Management   Visit Note  02/12/2024  Name:  Alexis Farmer MRN: [id number] DOB: [birth year]  Situation: Referral received for Complex Care Management related to Heart Failure and Diabetes with Complications I obtained verbal consent from Patient.  Visit completed with Patient  on the phone  Background:   Past Medical History:  Diagnosis Date   Acute on chronic heart failure with preserved ejection fraction (HFpEF) (HCC) 04/01/2023   Anxiety 09/13/2015   Arthritis    Atypical chest pain 01/15/2021   Blackhead 10/24/2021   Bradycardia 07/17/2016   Formatting of this note might be different from the original.  Last Assessment & Plan:   Found to be bradycardic at outside hospital. They stopped metoprolol  and verapamil  and heart rate has been normal since then. Echo appears to have been reassuring at the outside facility. Appears to be doing much better with regards to this. She'll continue to monitor for recurrent symptoms. 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  Last Assessment & Plan:   Continues to have issues with this.  She is following with a specialist and it sounds as though they are planning on an MRI.  Benign exam today.   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 Dennise Deal of this note might be different from the original.  Dr. Dennise      Last Assessment & Plan:   Recheck kidney function today.   Constipation 11/17/2019   Cough 01/25/2016   Formatting of this note might be different from the original.  Last Assessment & Plan:   Minimal nighttime cough. Improves when she washes her pillows consistently. Suspect allergies contributing. She'll monitor.   Diabetes mellitus without complication (HCC)    one elevated reading/ no treatment   Disorder of rotator cuff 06/24/2018   Diverticulitis    Dysuria 03/22/2017   Formatting of this  note might be different from the original.  Last Assessment & Plan:   Symptoms concerning for UTI.  Will check urinalysis.   Edema of foot 04/08/2014   Formatting of this note might be different from the original.  Last Assessment & Plan:   Chronic pedal edema. Suspect venous insufficiency. No orthopnea or shortness of breath. Advised elevation of her legs. Consider compression stockings in the future.   Elevated troponin 12/15/2020   Essential hypertension 04/08/2014   Formatting of this note might be different from the original.  Last Assessment & Plan:   Well-controlled on recheck.  Continue current regimen.   Fall 03/26/2018   Gastroenteritis 08/12/2021   Gastrointestinal hemorrhage 08/03/2015   GI bleed    High cholesterol    History of blood transfusion    Hyperglycemia due to type 2 diabetes mellitus (HCC) 03/11/2015   Hypertension    Hypertensive urgency 12/15/2020   Low back pain 04/08/2014   Morbid obesity (HCC) 04/08/2014   Formatting of this note might be different from the original.  Last Assessment & Plan:   Weight is stable. Discussed diet and exercise at length. Encouraged whatever exercise she can do. Given diet information.   Palpitations 12/15/2020   Peripheral edema 12/22/2019   Postmenopausal bleeding 07/17/2016   Formatting of this note might be different from the original.  Last Assessment & Plan:   Recent D&C. Following with gynecology. Benign findings. Monitor for recurrence.   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  Last Assessment & Plan:   Continues to have issues with this.  She is following with a specialist and it sounds as though they are planning on an MRI.  Benign exam today.   Recurrent falls 08/13/2019   Renal insufficiency    Skin cyst 10/24/2021   Stage 3a chronic kidney disease (HCC) 01/15/2021    Swelling of lower leg 07/19/2016   Vertigo 10/13/2015   Formatting of this note might be different from the original.  Last Assessment & Plan:   No recurrence. Discussed that meclizine  is an as needed medication and that she does not need to take this daily. She will continue to monitor.    Assessment: Patient Reported Symptoms:  Cognitive Cognitive Status: Alert and oriented to person, place, and time, Insightful and able to interpret abstract concepts, Normal speech and language skills Cognitive/Intellectual Conditions Management [RPT]: None reported or documented in medical history or problem list   Health Maintenance Behaviors: Annual physical exam Healing Pattern: Slow  Neurological Neurological Review of Symptoms: Numbness Oher Neurological Symptoms/Conditions [RPT]: gabapentin , still discomfort but less Neurological Management Strategies: Medication therapy, Routine screening Neurological Comment: occasional dizzyness -  will sometimes happen when sitting - room starts to move gets nausea at times beforehand - closing eyes helps  HEENT HEENT Symptoms Reported: Tinnitus HEENT Management Strategies: Routine screening, Diet modification HEENT Comment: tinnitus improved overall    Cardiovascular Cardiovascular Symptoms Reported: No symptoms reported Cardiovascular Comment: denies HF symptoms - unable to get self on scale fast enough - reviewed HF action plan and red flags for ED  Respiratory Additional Respiratory Details: occasional cough Respiratory Management Strategies: Routine screening, Medication therapy  Endocrine Endocrine Symptoms Reported: No symptoms reported Is patient diabetic?: Yes Is patient checking blood sugars at home?: Yes List most recent blood sugar readings, include date and time of day: FBG 199 today - states sometimes higher in am, patient afraid of low BG discussed to keep lower for kidney health, states hands get numb around 120, able to state proper  treatment for lows, states getting CGM Wednesday Endocrine Self-Management Outcome: 3 (uncertain)  Gastrointestinal Additional Gastrointestinal Details: constipation managed Gastrointestinal Management Strategies: Medication therapy    Genitourinary Genitourinary Symptoms Reported: Incontinence Genitourinary Management Strategies: Medication therapy, Incontinence garment/pad  Integumentary Integumentary Symptoms Reported: No symptoms reported Skin Management Strategies: Routine screening  Musculoskeletal Musculoskelatal Symptoms Reviewed: Difficulty walking, Limited mobility, Joint pain, Muscle pain, Weakness Musculoskeletal Management Strategies: Medication therapy, Routine screening Falls in the past year?: Yes Number of falls in past year: 2 or more Was there an injury with Fall?: Yes Fall Risk Category Calculator: 3 Patient Fall Risk Level: High Fall Risk Patient at Risk for Falls Due to: History of fall(s), Impaired balance/gait, Impaired mobility, Other (Comment) (Stoke) Fall risk Follow up: Falls evaluation completed, Falls prevention discussed  Psychosocial Psychosocial Symptoms Reported: Sadness - if selected complete PHQ 2-9 Additional Psychological Details: patient requesting to cancel LCSW appointment for now - discussed how they can help with defision about living situation - cancelled as requested Behavioral Management Strategies: Support system Major Change/Loss/Stressor/Fears (CP): Medical condition, self Techniques to Cope with Loss/Stress/Change: Diversional activities Quality of Family Relationships: supportive Do you feel physically threatened by others?: No    02/12/2024    PHQ2-9 Depression Screening   Little interest or pleasure in doing things Not at all  Feeling down,  depressed, or hopeless Not at all  PHQ-2 - Total Score 0  Trouble falling or staying asleep, or sleeping too much    Feeling tired or having little energy    Poor appetite or overeating      Feeling bad about yourself - or that you are a failure or have let yourself or your family down    Trouble concentrating on things, such as reading the newspaper or watching television    Moving or speaking so slowly that other people could have noticed.  Or the opposite - being so fidgety or restless that you have been moving around a lot more than usual    Thoughts that you would be better off dead, or hurting yourself in some way    PHQ2-9 Total Score    If you checked off any problems, how difficult have these problems made it for you to do your work, take care of things at home, or get along with other people    Depression Interventions/Treatment      Vitals:   02/12/24 1042  BP: (!) 142/72    Medications Reviewed Today     Reviewed by Devra Lands, RN (Registered Nurse) on 02/12/24 at 1028  Med List Status: <None>   Medication Order Taking? Sig Documenting Provider Last Dose Status Informant  acetaminophen  (TYLENOL ) 500 MG tablet [id number] Yes Take 500 mg by mouth every 6 (six) hours as needed for mild pain or headache. [provider]  Active Nursing Home Medication Administration Guide (MAG), Pharmacy Records  Ascorbic Acid (VITAMIN C PO) [id number] Yes Take 1 tablet by mouth daily. [provider]  Active Nursing Home Medication Administration Guide (MAG), Pharmacy Records  aspirin  EC 81 MG tablet [id number] Yes Take 81 mg by mouth daily. Swallow whole. [provider]  Active Nursing Home Medication Administration Guide [facility]), Pharmacy Records           Med Note LYELL, REENA KANDICE Debar Jul 10, 2022 10:42 AM)    blood glucose meter kit and supplies KIT [id number] Yes Accu chek, Dx code E11.65, check 3 times daily Maribeth Camellia KANDICE, MD  Active Nursing Home Medication Administration Guide (MAG), Pharmacy Records  Blood Pressure KIT [id number] Yes 1 Device by Does not apply route daily. McLean-Scocuzza, Randine SAILOR, MD  Active Nursing Home Medication  Administration Guide (MAG), Pharmacy Records  carvedilol  (COREG ) 6.25 MG tablet [id number] Yes Take 1 tablet (6.25 mg total) by mouth 2 (two) times daily with a meal. Djan, Drue DASEN, MD  Active Nursing Home Medication Administration Guide (MAG), Pharmacy Records  clopidogrel  (PLAVIX ) 75 MG tablet [id number] Yes Take 75 mg by mouth. [provider]  Active   fluticasone  (FLONASE ) 50 MCG/ACT nasal spray [id number] Yes Place 1 spray into both nostrils daily.  Patient taking differently: Place 1 spray into both nostrils as needed.   Bair, Kalpana, MD  Active   gabapentin  (NEURONTIN ) 300 MG capsule [id number] Yes Take 1 capsule (300 mg total) by mouth at bedtime. Bair, Kalpana, MD  Active   glucose blood (ACCU-CHEK GUIDE TEST) test strip [id number] Yes Use as instructed Narendra, Nischal, MD  Active   hydrALAZINE  (APRESOLINE ) 100 MG tablet [id number] Yes TAKE 1 TABLET BY MOUTH 4 TIMES DAILY. MUST KEEP UPCOMING APPOINTMENT ON 10/21/23 FOR FUTURE REFILLS. Dorinda Drue DASEN, MD  Active   insulin  NPH Human (NOVOLIN N) 100 UNIT/ML injection [id number] Yes Inject 15-20 Units into the skin 2 (two) times daily before a meal. Inject 20 units  in the morning an 15 units in the evening. [provider]  Active Nursing Home Medication Administration Guide (MAG), Pharmacy Records  insulin  regular (NOVOLIN R) 100 units/mL injection [id number]  Inject 0-0.08 mLs into the skin. [provider]  Active   Insulin  Syringe-Needle U-100 (BD INSULIN  SYRINGE U/F) 30G X 1/2 0.5 ML MISC [id number] Yes USE UP TO 5X PER DAY WITH INSULIN  2X (NPH) AND 3X (REGULAR) Hope Merle, MD  Active Nursing Home Medication Administration Guide (MAG), Pharmacy Records  Insulin  Syringe-Needle U-100 30G X 1/2 0.5 ML MISC [id number] Yes Take as directed Onesimo Claude, MD  Active   ipratropium-albuterol  (DUONEB) 0.5-2.5 (3) MG/3ML SOLN [id number]  Take 3 mLs by nebulization 4 (four) times daily. Take scheduled 4 times daily until  wheezing and shortness of breath from RSV improves. Then take as needed.  Patient not taking: Reported on 02/12/2024   [provider]  Active Pharmacy Records, Nursing Home Medication Administration Guide (MAG)           Med Note BEVERLEE, MERLYNN   Sun Sep 29, 2023 10:37 PM) Pt states worsens symptoms  lactulose (CHRONULAC) 10 GM/15ML solution [id number] Yes Take 30 g by mouth. [provider]  Active   Multiple Vitamins-Minerals (CENTRUM SILVER 50+WOMEN) TABS [id number] Yes Take 1 tablet by mouth daily with breakfast. [provider]  Active Nursing Home Medication Administration Guide (MAG), Pharmacy Records  nystatin  (MYCOSTATIN /NYSTOP ) powder [id number] Yes Apply 1 Application topically 2 (two) times daily as needed. Narendra, Nischal, MD  Active   rosuvastatin  (CRESTOR ) 20 MG tablet [id number] Yes TAKE 1 TABLET BY MOUTH EVERY DAY McLean-Scocuzza, Randine SAILOR, MD  Active Nursing Home Medication Administration Guide (MAG), Pharmacy Records           Med Note GROVER BURNARD GORMAN Pablo Apr 01, 2023  1:08 PM)    senna-docusate (SENOKOT-S) 8.6-50 MG tablet [id number] Yes Take 1 tablet by mouth 2 (two) times daily. Dorinda Drue DASEN, MD  Active Nursing Home Medication Administration Guide (MAG), Pharmacy Records  spironolactone  (ALDACTONE ) 25 MG tablet [id number] Yes Take 25 mg by mouth 3 (three) times a week. Monday Wednesday and friday [provider]  Active Nursing Home Medication Administration Guide [facility]), Pharmacy Records           Med Note BEVERLEE MERLYNN Repress Sep 29, 2023 10:37 PM) Taking Mondays, Wednesdays, and fridays  torsemide  (DEMADEX ) 20 MG tablet [id number] Yes Take 1.5 tablets (30 mg total) by mouth daily. [place] A, FNP  Active   traZODone  (DESYREL ) 50 MG tablet [id number] Yes Take 1 tablet (50 mg total) by mouth at bedtime as needed for sleep. Dorinda Drue DASEN, MD  Active Nursing Home Medication Administration Guide [facility]), Pharmacy Records  Med List Note  Reinhold, Recardo HERO, CPhT 10/19/23 1104): [facility]            Recommendation:   PCP Follow-up Continue Current Plan of Care  Follow Up Plan:   Telephone follow-up in 1 month  Nestora Duos, MSN, RN [facility]  [facility], [facility] RN Care Manager Direct Dial: [phone] Fax: [phone]

## 2024-02-18 ENCOUNTER — Telehealth: Admitting: *Deleted

## 2024-02-20 ENCOUNTER — Other Ambulatory Visit: Payer: Self-pay

## 2024-02-20 DIAGNOSIS — E1159 Type 2 diabetes mellitus with other circulatory complications: Secondary | ICD-10-CM

## 2024-02-20 DIAGNOSIS — E785 Hyperlipidemia, unspecified: Secondary | ICD-10-CM

## 2024-02-20 DIAGNOSIS — E1129 Type 2 diabetes mellitus with other diabetic kidney complication: Secondary | ICD-10-CM

## 2024-02-20 NOTE — Telephone Encounter (Unsigned)
 Copied from CRM [id number]. Topic: Clinical - Medication Refill >> Feb 20, 2024 11:39 AM [place] J wrote: Medication:  rosuvastatin  (CRESTOR ) 20 MG tablet carvedilol  (COREG ) 6.25 MG tablet clopidogrel  (PLAVIX ) 75 MG tablet spironolactone  (ALDACTONE ) 25 MG tablet gabapentin  (NEURONTIN ) 300 MG capsule  Has the patient contacted their pharmacy? Yes (Agent: If no, request that the patient contact the pharmacy for the refill. If patient does not wish to contact the pharmacy document the reason why and proceed with request.) (Agent: If yes, when and what did the pharmacy advise?) Pharmacy called patient to let her know that she needs to reach out to primary for a refill.  This is the patient's preferred pharmacy:  [phone] [place] [place] - [address] [place] [address] Phone: [phone] Fax: [phone]  Is this the correct pharmacy for this prescription? Yes If no, delete pharmacy and type the correct one.   Has the prescription been filled recently? No  Is the patient out of the medication? Yes  Has the patient been seen for an appointment in the last year OR does the patient have an upcoming appointment? Yes  Can we respond through MyChart? No  Agent: Please be advised that Rx refills may take up to 3 business days. We ask that you follow-up with your pharmacy.

## 2024-02-20 NOTE — Telephone Encounter (Signed)
 Copied from CRM [id number]. Topic: Clinical - Medication Refill >> Feb 20, 2024 11:39 AM [place] J wrote: Medication:  rosuvastatin  (CRESTOR ) 20 MG tablet carvedilol  (COREG ) 6.25 MG tablet clopidogrel  (PLAVIX ) 75 MG tablet spironolactone  (ALDACTONE ) 25 MG tablet gabapentin  (NEURONTIN ) 300 MG capsule  Has the patient contacted their pharmacy? Yes (Agent: If no, request that the patient contact the pharmacy for the refill. If patient does not wish to contact the pharmacy document the reason why and proceed with request.) (Agent: If yes, when and what did the pharmacy advise?) Pharmacy called patient to let her know that she needs to reach out to primary for a refill.  This is the patient's preferred pharmacy:  [phone], [place] - [address] [place] [address] Phone: [phone] Fax: [phone]  Is this the correct pharmacy for this prescription? Yes If no, delete pharmacy and type the correct one.   Has the prescription been filled recently? No  Is the patient out of the medication? Yes  Has the patient been seen for an appointment in the last year OR does the patient have an upcoming appointment? Yes  Can we respond through MyChart? No  Agent: Please be advised that Rx refills may take up to 3 business days. We ask that you follow-up with your pharmacy. >> Feb 20, 2024 11:49 AM [place] J wrote: Patient wanted to stress that a 90-day supply is needed for all medications due to insurance coverage.

## 2024-02-23 MED ORDER — CARVEDILOL 6.25 MG PO TABS: 6.2500 mg | ORAL_TABLET | Freq: Two times a day (BID) | ORAL | 3 refills | Status: DC

## 2024-02-23 MED ORDER — ROSUVASTATIN CALCIUM 20 MG PO TABS: ORAL_TABLET | ORAL | 3 refills | Status: AC

## 2024-02-23 NOTE — Telephone Encounter (Signed)
 This is duplicate. Defer to telephone call/message from 02/23/24.   Luke Shade, MD

## 2024-02-23 NOTE — Telephone Encounter (Signed)
 I reviewed patient's chart.  Gabapentin  refill, not appropriate. Last refill: sent on 02/06/24 (90 cap with one refill).  I sent refill on Rosuvastatin  20 mg, (90 tablets with 3 refills).  Carvedilol  6.25 mg ( 90 days with 3 refills)  Please forward the following cardiology medications refill to cardiology clinic and let patient know that she will have to reach  - Spironolactone ,Clopidogrel   1. Hypertension associated with diabetes (HCC) - carvedilol  (COREG ) 6.25 MG tablet; Take 1 tablet (6.25 mg total) by mouth 2 (two) times daily with a meal.  Dispense: 180 tablet; Refill: 3  2. Hyperlipidemia, unspecified hyperlipidemia type - rosuvastatin  (CRESTOR ) 20 MG tablet; TAKE 1 TABLET BY MOUTH EVERY DAY  Dispense: 90 tablet; Refill: 3   Toren Tucholski, MD

## 2024-02-24 ENCOUNTER — Telehealth: Payer: Self-pay | Admitting: Cardiovascular Disease

## 2024-02-24 ENCOUNTER — Telehealth: Payer: Self-pay

## 2024-02-24 DIAGNOSIS — E1159 Type 2 diabetes mellitus with other circulatory complications: Secondary | ICD-10-CM | POA: Diagnosis not present

## 2024-02-24 DIAGNOSIS — I5032 Chronic diastolic (congestive) heart failure: Secondary | ICD-10-CM

## 2024-02-24 DIAGNOSIS — I152 Hypertension secondary to endocrine disorders: Secondary | ICD-10-CM | POA: Diagnosis not present

## 2024-02-24 DIAGNOSIS — Z794 Long term (current) use of insulin: Secondary | ICD-10-CM

## 2024-02-24 DIAGNOSIS — Z9181 History of falling: Secondary | ICD-10-CM

## 2024-02-24 DIAGNOSIS — S0003XD Contusion of scalp, subsequent encounter: Secondary | ICD-10-CM | POA: Diagnosis not present

## 2024-02-24 DIAGNOSIS — C8510 Unspecified B-cell lymphoma, unspecified site: Secondary | ICD-10-CM

## 2024-02-24 DIAGNOSIS — Z7982 Long term (current) use of aspirin: Secondary | ICD-10-CM

## 2024-02-24 DIAGNOSIS — Z8673 Personal history of transient ischemic attack (TIA), and cerebral infarction without residual deficits: Secondary | ICD-10-CM | POA: Diagnosis not present

## 2024-02-24 MED ORDER — CLOPIDOGREL BISULFATE 75 MG PO TABS: 75.0000 mg | ORAL_TABLET | Freq: Every day | ORAL | 2 refills | Status: AC

## 2024-02-24 NOTE — Telephone Encounter (Signed)
 Copied from CRM [id number]. Topic: Clinical - Prescription Issue >> Feb 24, 2024 12:27 PM Corin V wrote: Reason for CRM: [facility]  prescription sent in 8/21. She stated she has called the pharmacy about this multiple times and they continue to say they have not received the script from Dr. Abbey. Confirmed it was sent to correct [phone] [place] - [address]  [address] [place], [place] [address]. Please contact pharmacy to give verbal or resend script as they have not received it

## 2024-02-24 NOTE — Telephone Encounter (Signed)
 [facility]  prescription. Pharmacist technician states patient was informed that they do have the prescription and it is ready for pick up. Patient requested another prescription refill on Plavix  and Dr Abbey informed pharmacy patient would need to reach out to her Cardiologist.

## 2024-02-24 NOTE — Telephone Encounter (Signed)
*  STAT* If patient is at the pharmacy, call can be transferred to refill team.   1. Which medications need to be refilled? (please list name of each medication and dose if known) clopidogrel  (PLAVIX ) 75 MG tablet [[id number]]    2. Would you like to learn more about the convenience, safety, & potential cost savings by using the [facility]? NA    3. Are you open to using the [facility] (Type [facility]. Na    4. Which pharmacy/location (including street and city if local pharmacy) is medication to be sent to? [id number], [place] - [address] Phone: [phone]  Fax: [phone]       5. Do they need a 30 day or 90 day supply? 90

## 2024-02-24 NOTE — Telephone Encounter (Signed)
 Pt's medication was sent to pt's pharmacy as requested. Confirmation received.

## 2024-02-24 NOTE — Telephone Encounter (Signed)
 Pt is aware and gave a verbal understanding.

## 2024-02-28 ENCOUNTER — Telehealth: Payer: Self-pay

## 2024-02-28 NOTE — Telephone Encounter (Signed)
 Copied from CRM #[id number]. Topic: General - Other >> Feb 28, 2024  9:03 AM Burnard DEL wrote: Reason for CRM: Kate PT assistant from [facility] called to report that she seen patient on yesterday 02/27/2024 and she had pain 8 out of 10  from right hip down to knee. She wanted to call and have this reported.

## 2024-03-02 ENCOUNTER — Other Ambulatory Visit: Payer: Self-pay

## 2024-03-02 ENCOUNTER — Emergency Department

## 2024-03-02 ENCOUNTER — Emergency Department
Admission: EM | Admit: 2024-03-02 | Discharge: 2024-03-02 | Disposition: A | Discharge status: Home / Self Care | Attending: Emergency Medicine | Admitting: Emergency Medicine

## 2024-03-02 DIAGNOSIS — I130 Hypertensive heart and chronic kidney disease with heart failure and stage 1 through stage 4 chronic kidney disease, or unspecified chronic kidney disease: Secondary | ICD-10-CM | POA: Insufficient documentation

## 2024-03-02 DIAGNOSIS — W19XXXA Unspecified fall, initial encounter: Secondary | ICD-10-CM | POA: Diagnosis not present

## 2024-03-02 DIAGNOSIS — M7989 Other specified soft tissue disorders: Secondary | ICD-10-CM | POA: Diagnosis not present

## 2024-03-02 DIAGNOSIS — S79921A Unspecified injury of right thigh, initial encounter: Secondary | ICD-10-CM | POA: Diagnosis present

## 2024-03-02 DIAGNOSIS — Z8572 Personal history of non-Hodgkin lymphomas: Secondary | ICD-10-CM | POA: Insufficient documentation

## 2024-03-02 DIAGNOSIS — M25551 Pain in right hip: Secondary | ICD-10-CM | POA: Insufficient documentation

## 2024-03-02 DIAGNOSIS — E1122 Type 2 diabetes mellitus with diabetic chronic kidney disease: Secondary | ICD-10-CM | POA: Insufficient documentation

## 2024-03-02 DIAGNOSIS — M79604 Pain in right leg: Secondary | ICD-10-CM

## 2024-03-02 DIAGNOSIS — I509 Heart failure, unspecified: Secondary | ICD-10-CM | POA: Insufficient documentation

## 2024-03-02 DIAGNOSIS — N189 Chronic kidney disease, unspecified: Secondary | ICD-10-CM | POA: Diagnosis not present

## 2024-03-02 DIAGNOSIS — S7011XA Contusion of right thigh, initial encounter: Secondary | ICD-10-CM | POA: Diagnosis not present

## 2024-03-02 MED ORDER — OXYCODONE-ACETAMINOPHEN 5-325 MG PO TABS
1.0000 | ORAL_TABLET | Freq: Once | ORAL | Status: AC
  Administered 2024-03-02: 1 via ORAL
  Filled 2024-03-02: qty 1

## 2024-03-02 MED ORDER — LIDOCAINE 5 % EX PTCH: 1.0000 | MEDICATED_PATCH | Freq: Two times a day (BID) | CUTANEOUS | 0 refills | Status: DC

## 2024-03-02 MED ORDER — LIDOCAINE 5 % EX PTCH
1.0000 | MEDICATED_PATCH | CUTANEOUS | Status: DC
  Administered 2024-03-02: 1 via TRANSDERMAL
  Filled 2024-03-02: qty 1

## 2024-03-02 MED ORDER — OXYCODONE-ACETAMINOPHEN 5-325 MG PO TABS: 1.0000 | ORAL_TABLET | ORAL | 0 refills | Status: DC | PRN

## 2024-03-02 NOTE — ED Triage Notes (Addendum)
 Pt comes via EMs from home with right leg pain that radiates up her hip. Pt states this started three days.  Pt states she did fall month ago but was checked out for that.

## 2024-03-02 NOTE — ED Notes (Signed)
 Pt given TV remote as requested. Patient advocate at bedside.

## 2024-03-02 NOTE — ED Provider Notes (Signed)
 [facility] Provider Note    Event Date/Time   First MD Initiated Contact with Patient 03/02/24 [phone]     (approximate)   History   Chief Complaint Leg Pain   HPI  Alexis Farmer is a 83 y.o. female with past medical history of hypertension, diabetes, CHF, CKD, stroke, and B-cell lymphoma who presents to the ED complaining of leg pain.  Patient reports that she has had 3 days of pain shooting down from her right hip into her right knee.  She had a fall about 1 month ago onto this side, but did not have any pain in her leg at that time and so did not undergo imaging of anything other than her head and neck.  She reports some swelling around her right thigh, where she is tender to touch.  She has not noticed any redness, wounds, or drainage to the leg.     Physical Exam   Triage Vital Signs: ED Triage Vitals  Encounter Vitals Group     BP 03/02/24 0808 136/73     Girls Systolic BP Percentile --      Girls Diastolic BP Percentile --      Boys Systolic BP Percentile --      Boys Diastolic BP Percentile --      Pulse Rate 03/02/24 0808 75     Resp 03/02/24 0808 18     Temp 03/02/24 0808 98 F (36.7 C)     Temp src --      SpO2 03/02/24 0808 97 %     Weight 03/02/24 0807 200 lb (90.7 kg)     Height 03/02/24 0807 5' 3 (1.6 m)     Head Circumference --      Peak Flow --      Pain Score 03/02/24 0807 10     Pain Loc --      Pain Education --      Exclude from Growth Chart --     Most recent vital signs: Vitals:   03/02/24 0945 03/02/24 1100  BP: 101/89 107/74  Pulse: 67 68  Resp: 17 18  Temp:    SpO2: 100% 100%    Constitutional: Alert and oriented. Eyes: Conjunctivae are normal. Head: Atraumatic. Nose: No congestion/rhinnorhea. Mouth/Throat: Mucous membranes are moist.  Cardiovascular: Normal rate, regular rhythm. Grossly normal heart sounds.  2+ radial and DP pulses bilaterally. Respiratory: Normal respiratory effort.  No retractions.  Lungs CTAB. Gastrointestinal: Soft and nontender. No distention. Musculoskeletal: Diffuse tenderness to palpation of right lateral thigh with associated edema, no erythema or warmth noted.  Tenderness to palpation also noted at right hip and right knee with no obvious deformity.  No edema noted at these joints or at the ankle. Neurologic:  Normal speech and language. No gross focal neurologic deficits are appreciated.    ED Results / Procedures / Treatments   Labs (all labs ordered are listed, but only abnormal results are displayed) Labs Reviewed - No data to display   RADIOLOGY Right hip and thigh x-ray reviewed and interpreted by me with no fracture or dislocation.  PROCEDURES:  Critical Care performed: No  Procedures   MEDICATIONS ORDERED IN ED: Medications  lidocaine  (LIDODERM ) 5 % 1 patch (has no administration in time range)  oxyCODONE -acetaminophen  (PERCOCET/ROXICET) 5-325 MG per tablet 1 tablet (1 tablet Oral Given 03/02/24 0831)     IMPRESSION / MDM / ASSESSMENT AND PLAN / ED COURSE  I reviewed the triage vital signs and the  nursing notes.                              83 y.o. female with past medical history of hypertension, diabetes, CKD, CHF, stroke, and B-cell lymphoma who presents to the ED complaining of right leg pain and swelling for the past 3 days.  Patient's presentation is most consistent with acute complicated illness / injury requiring diagnostic workup.  Differential diagnosis includes, but is not limited to, fracture, dislocation, contusion, hematoma, DVT, venous insufficiency, arterial insufficiency.  Patient nontoxic-appearing and in no acute distress, vital signs are unremarkable.  She is neurovascularly intact distally to her right lower extremity with no signs of infection.  We will check ultrasound for DVT, also check x-rays of the hip and knee.  No findings concerning for septic arthritis, will treat symptomatically with Percocet and  reassess.  Right hip and thigh x-ray are unremarkable, ultrasound of the right lower extremity is limited but no obvious DVT and overall suspicion for DVT is low at this time.  Patient able to range right hip and knee and I doubt occult fracture, suspect thigh contusion, which is the primary area of her pain.  She is appropriate for discharge home with outpatient follow-up, will prescribe short course of pain medication and Lidoderm  patches.  She was counseled to follow-up with her PCP and to return to the ED for new or worsening symptoms, patient agrees with plan.      FINAL CLINICAL IMPRESSION(S) / ED DIAGNOSES   Final diagnoses:  Right leg pain  Contusion of right thigh, initial encounter     Rx / DC Orders   ED Discharge Orders          Ordered    lidocaine  (LIDODERM ) 5 %  Every 12 hours        03/02/24 1201    oxyCODONE -acetaminophen  (PERCOCET) 5-325 MG tablet  Every 4 hours PRN        03/02/24 1201             Note:  This document was prepared using Dragon voice recognition software and may include unintentional dictation errors.   Willo Dunnings, MD 03/02/24 [phone]

## 2024-03-02 NOTE — ED Triage Notes (Signed)
 Arrives from home via [facility]. C/O right knee pain x 1 month. Clemens last month. CBG:  258  136/94 99% 77 98.1  AOx4.  Hx CVA, DM,

## 2024-03-02 NOTE — ED Notes (Signed)
 Patient brief noted to be wet. Patient cleaned, placed in new brief, and sheets. Pt placed in hospital gown and personal clothing placed in hospital belonging bag due to being wet. Pt on phone talking with family.

## 2024-03-02 NOTE — ED Notes (Signed)
 Patient to xray at this time

## 2024-03-02 NOTE — ED Notes (Signed)
 US  at bedside.

## 2024-03-02 NOTE — ED Notes (Signed)
 PT stating brief is wet. Pt stating she still needed to go restroom. Pt assisted with walker to bedside commode. PT placed in new brief afterwards and assisted back in stretched.

## 2024-03-04 ENCOUNTER — Ambulatory Visit: Payer: Self-pay

## 2024-03-04 NOTE — Telephone Encounter (Signed)
 FYI Only or Action Required?: FYI only for provider.  Patient was last seen in primary care on 02/06/2024 by Abbey Bruckner, MD.  Called Nurse Triage reporting Leg Pain.  Symptoms began several days ago.  Interventions attempted: Prescription medications: Lidocaine  patch, oxycodone .  Symptoms are: gradually worsening.  Triage Disposition: See HCP Within 4 Hours (Or PCP Triage)  Patient/caregiver understands and will follow disposition?: No, refuses disposition         Copied from CRM [id number]. Topic: Clinical - Red Word Triage >> Mar 04, 2024  2:28 PM Mesmerise C wrote: [facility] that prompted transfer to Nurse Triage: Patient stated she's still having the pain the in her right leg describes it as a mild burning sensation and the medicine oxycodone -acetaminophen  makes her dizzy and wobbly made her almost fall yesterday Reason for Disposition  [1] SEVERE pain (e.g., excruciating, unable to do any normal activities) AND [2] not improved after 2 hours of pain medicine  Answer Assessment - Initial Assessment Questions Patient seen on 03/02/2024 in ED for symptoms. She states it is a burning sensation and medication not helping with symptoms.  This RN offered to schedule a Hospital follow up patient refused due to having another appointment that day. This RN also offered to schedule patient with a UC appointment and she refused as well.   1. ONSET: When did the pain start?      3 days ago  2. LOCATION: Where is the pain located?      R Leg  3. PAIN: How bad is the pain?    (Scale 1-10; or mild, moderate, severe)     8/10 4. OTHER SYMPTOMS: Do you have any other symptoms? (e.g., chest pain, back pain, breathing difficulty, swelling, rash, fever, numbness, weakness)     Denies any other symptoms.  Protocols used: Leg Pain-A-AH

## 2024-03-11 ENCOUNTER — Telehealth: Payer: Self-pay

## 2024-03-11 NOTE — Patient Instructions (Signed)
 Ulysees Katz - I am sorry I was unable to reach you today for our scheduled appointment. I work with Bair, Kalpana, MD and am calling to support your healthcare needs. Please contact me at [phone] at your earliest convenience. I look forward to speaking with you soon.   Thank you,  Nestora Duos, MSN, RN [facility] RN Care Manager Direct Dial: [phone] Fax: [phone]'

## 2024-03-16 ENCOUNTER — Ambulatory Visit: Payer: Self-pay

## 2024-03-16 ENCOUNTER — Other Ambulatory Visit: Payer: Self-pay

## 2024-03-16 DIAGNOSIS — R350 Frequency of micturition: Secondary | ICD-10-CM

## 2024-03-16 NOTE — Telephone Encounter (Signed)
 FYI Only or Action Required?: FYI only for provider.  Patient was last seen in primary care on 02/06/2024 by Abbey Bruckner, MD.  Called Nurse Triage reporting Urinary Frequency.  Symptoms began a week ago.  Interventions attempted: Nothing.  Symptoms are: unchanged.  Triage Disposition: See PCP Within 2 Weeks  Patient/caregiver understands and will follow disposition?: Yes     Copied from CRM [id number]. Topic: Clinical - Red Word Triage >> Mar 16, 2024  2:10 PM Frederich PARAS wrote: [facility] that prompted transfer to Nurse Triage: pin on right side and using the restroom too often. went to er 9/15 pt says they never took a urine or blood test. she has been urinating evry 15 mins. leg right and right side hurt, in pain, ever since 9/15, she says they did not do anything, she still is urinating. Reason for Disposition  [1] Can't control passage of urine (i.e., urinary incontinence, wetting self) AND [2] present > 2 weeks  Answer Assessment - Initial Assessment Questions 1. SYMPTOM: What's the main symptom you're concerned about? (e.g., frequency, incontinence)     Frequency with urination 2. ONSET: When did the  burning  start?     9/15 3. PAIN: Is there any pain? If Yes, ask: How bad is it? (Scale: 1-10; mild, moderate, severe)     States slight burn 4. CAUSE: What do you think is causing the symptoms?     uti 5. OTHER SYMPTOMS: Do you have any other symptoms? (e.g., blood in urine, fever, flank pain, pain with urination)     Flank pain, leg pain,  6. PREGNANCY: Is there any chance you are pregnant? When was your last menstrual period?     na  Protocols used: Urinary Symptoms-A-AH

## 2024-03-17 ENCOUNTER — Other Ambulatory Visit

## 2024-03-17 ENCOUNTER — Other Ambulatory Visit: Payer: Self-pay | Admitting: Internal Medicine

## 2024-03-17 ENCOUNTER — Other Ambulatory Visit: Payer: Self-pay

## 2024-03-17 DIAGNOSIS — R350 Frequency of micturition: Secondary | ICD-10-CM

## 2024-03-17 DIAGNOSIS — N3000 Acute cystitis without hematuria: Secondary | ICD-10-CM

## 2024-03-17 LAB — POC URINALSYSI DIPSTICK (AUTOMATED)
Bilirubin, UA: NEGATIVE
Glucose, UA: NEGATIVE
Ketones, UA: NEGATIVE
Leukocytes, UA: NEGATIVE
Nitrite, UA: NEGATIVE
Protein, UA: POSITIVE — AB
Spec Grav, UA: 1.015 (ref 1.010–1.025)
Urobilinogen, UA: 0.2 U/dL
pH, UA: 6 (ref 5.0–8.0)

## 2024-03-17 NOTE — Addendum Note (Signed)
 Addended by: TANDA HARVEY D on: 03/17/2024 02:10 PM   Modules accepted: Orders

## 2024-03-17 NOTE — Addendum Note (Signed)
 Addended by: TANDA HARVEY D on: 03/17/2024 02:09 PM   Modules accepted: Orders

## 2024-03-18 ENCOUNTER — Encounter: Payer: Self-pay | Admitting: Internal Medicine

## 2024-03-18 ENCOUNTER — Ambulatory Visit (INDEPENDENT_AMBULATORY_CARE_PROVIDER_SITE_OTHER): Admitting: Internal Medicine

## 2024-03-18 ENCOUNTER — Other Ambulatory Visit

## 2024-03-18 VITALS — BP 116/76 | HR 76 | Temp 98.1°F | Ht 63.0 in | Wt 200.0 lb

## 2024-03-18 DIAGNOSIS — M7660 Achilles tendinitis, unspecified leg: Secondary | ICD-10-CM | POA: Insufficient documentation

## 2024-03-18 DIAGNOSIS — M79651 Pain in right thigh: Secondary | ICD-10-CM | POA: Diagnosis not present

## 2024-03-18 DIAGNOSIS — Z789 Other specified health status: Secondary | ICD-10-CM

## 2024-03-18 DIAGNOSIS — M7661 Achilles tendinitis, right leg: Secondary | ICD-10-CM | POA: Diagnosis not present

## 2024-03-18 DIAGNOSIS — K5900 Constipation, unspecified: Secondary | ICD-10-CM | POA: Diagnosis not present

## 2024-03-18 DIAGNOSIS — N3000 Acute cystitis without hematuria: Secondary | ICD-10-CM | POA: Diagnosis not present

## 2024-03-18 DIAGNOSIS — E1159 Type 2 diabetes mellitus with other circulatory complications: Secondary | ICD-10-CM

## 2024-03-18 DIAGNOSIS — G4700 Insomnia, unspecified: Secondary | ICD-10-CM

## 2024-03-18 DIAGNOSIS — I152 Hypertension secondary to endocrine disorders: Secondary | ICD-10-CM

## 2024-03-18 LAB — URINALYSIS, ROUTINE W REFLEX MICROSCOPIC
Bilirubin Urine: NEGATIVE
Glucose, UA: NEGATIVE
Hgb urine dipstick: NEGATIVE
Hyaline Cast: NONE SEEN /LPF
Ketones, ur: NEGATIVE
Leukocytes,Ua: NEGATIVE
Nitrite: NEGATIVE
RBC / HPF: NONE SEEN /HPF (ref 0–2)
Specific Gravity, Urine: 1.01 (ref 1.001–1.035)
WBC, UA: NONE SEEN /HPF (ref 0–5)
pH: 6.5 (ref 5.0–8.0)

## 2024-03-18 LAB — MICROSCOPIC MESSAGE

## 2024-03-18 MED ORDER — SULFAMETHOXAZOLE-TRIMETHOPRIM 800-160 MG PO TABS: 1.0000 | ORAL_TABLET | Freq: Two times a day (BID) | ORAL | 0 refills | Status: DC

## 2024-03-18 MED ORDER — SENNOSIDES-DOCUSATE SODIUM 8.6-50 MG PO TABS: 1.0000 | ORAL_TABLET | Freq: Two times a day (BID) | ORAL | Status: DC

## 2024-03-18 MED ORDER — SENNOSIDES-DOCUSATE SODIUM 8.6-50 MG PO TABS: 1.0000 | ORAL_TABLET | Freq: Two times a day (BID) | ORAL | 1 refills | Status: AC

## 2024-03-18 NOTE — Assessment & Plan Note (Signed)
-   Patient states that she has not been sleeping secondary to her pain and that the trazodone  has been giving her nightmares -She stopped taking the trazodone  -Will reassess insomnia symptoms once her pain is better controlled

## 2024-03-18 NOTE — Addendum Note (Signed)
 Addended by: Alajah Witman on: 03/18/2024 02:26 PM   Modules accepted: Orders

## 2024-03-18 NOTE — Assessment & Plan Note (Signed)
-   Patient complains of increased urinary frequency as well as suprapubic abdominal pain particularly worse prior to her voiding -She had dropped off a urine sample yesterday which showed many bacteria as well as trace blood but no leukocytes or nitrites as well  -On exam today, she has mild suprapubic abdominal tenderness -Will follow-up urine culture -Given her symptoms as well as tenderness and the presence of many bacteria in her urine we will start her on Bactrim  to complete a 5-day course -No further workup at this time

## 2024-03-18 NOTE — Assessment & Plan Note (Signed)
-   Patient has a history of chronic constipation and is on senna for this -She needs a refill of this medication today -I have put in a refill for her -No further workup at this time

## 2024-03-18 NOTE — Assessment & Plan Note (Signed)
-   Patient complains of significant pain and swelling over her right mid thigh.  No fevers or chills -On exam, she was noted to have mild swelling and some tenderness to palpation at that site -No erythema or increased local warmth noted -Patient does states she had a fall relatively recently and is on aspirin  and Plavix .  I suspect that patient may have a hematoma secondary to recent fall -Will obtain soft tissue ultrasound of her right lower extremity for further evaluation -Continue with Tylenol  as needed for pain (patient states that oxycodone  prescribed by the ED was not helping).  Would avoid NSAIDs given her CKD -No further workup at this time

## 2024-03-18 NOTE — Assessment & Plan Note (Signed)
-   Patient complains of significant pain over the back of her ankle -She states that she had a sore there when she was admitted at a SNF but this healed -She was concerned that she has an infection despite the sore healing -On exam, she has no open wounds.  No erythema or increased local warmth -She does have significant tenderness to palpation at the insertion of her right Achilles tendon -I suspect that she may have an Achilles tendinitis and have sent a message to her podiatrist to see if they could evaluate her sooner than her next appointment (November) -No further workup at this time

## 2024-03-18 NOTE — Patient Instructions (Signed)
  VISIT SUMMARY: Today, you were seen for frequent urination and leg pain. We discussed your symptoms, including pain with urination and leg pain that radiates from your back to your knee. We also reviewed your sleep disturbances and concerns about possible infections.  YOUR PLAN: -URINARY TRACT INFECTION: A urinary tract infection (UTI) is an infection in any part of your urinary system. You have increased urinary frequency and pain, which suggests a UTI. We have prescribed an antibiotic that is safe for you, considering your allergies. We are waiting for the culture results to identify the specific bacteria.  -RIGHT ACHILLES TENDINITIS WITH RIGHT LOWER EXTREMITY PAIN: Achilles tendinitis is an overuse injury of the Achilles tendon, which connects your calf muscles to your heel bone. Your pain radiates from your back to your knee and affects your daily activities. We have referred you to Dr. May for further evaluation and management.  -TYPE 2 DIABETES MELLITUS WITH OTHER DIABETIC KIDNEY COMPLICATION: Type 2 diabetes is a condition that affects the way your body processes blood sugar. There was no specific discussion about your diabetes management or kidney complications today.  INSTRUCTIONS: Please follow up with Dr. May for your foot evaluation. Coordinate with Almarie for assistance with gowning for further examination.   For your right thigh pain and swelling I have put you in for an ultrasound of that area to further evaluate the cause of the pain.  Please continue with Tylenol  as needed for the pain.  Avoid NSAIDs.                   Contains text generated by Abridge.                                 Contains text generated by Abridge.

## 2024-03-18 NOTE — Patient Outreach (Signed)
 RNCM - patient called requesting Meals on Wheels. Aware will refer to BSW for assist. Unable to schedule BSW or reschedule missed appointment with RNCM at this time.

## 2024-03-18 NOTE — Patient Outreach (Signed)
 RNCM - Patient referred to [facility] for Meals on Wheels application assist as requested.

## 2024-03-18 NOTE — Assessment & Plan Note (Signed)
-   This problem is chronic and stable -Blood pressure today is well-controlled at 116/76 -We will continue with spironolactone  25 mg 3 times a week, carvedilol  6.25 mg twice daily -No further workup at this time

## 2024-03-18 NOTE — Progress Notes (Signed)
 Acute Office Visit  Subjective:     Patient ID: Alexis Farmer, female    DOB: [birth year], 83 y.o.   MRN: [id number]  Chief Complaint  Patient presents with   Acute Visit    Flank pain & urine frequency  Discussed the use of AI scribe software for clinical note transcription with the patient, who gave verbal consent to proceed.  History of Present Illness Alexis Farmer is an 83 year old female with diabetes who presents with frequent urination and leg pain.  Dysuria and urinary frequency - Increased frequency of urination - Pain with urination, particularly immediately prior to voiding - Pain localized over the bladder area - No fever  Right lower extremity and back pain - Patient complains of right lower back pain that radiates down the back of her leg but particularly complains of right upper thigh pain and swelling -Patient also complains of a recent history of a sore on the right heel which healed but she remains concerned that she has pain in this area as well particularly near the Achilles tendon insertion -Was prescribed oxycodone  recently from the ED with no improvement in her pain  Sleep disturbance - Difficulty sleeping due to pain - Nightmares associated with use of sleeping pills.  She has stopped taking trazodone      Review of Systems  Constitutional: Negative.   HENT: Negative.    Respiratory: Negative.    Cardiovascular: Negative.   Genitourinary:  Positive for dysuria and frequency. Negative for flank pain and hematuria.  Musculoskeletal:  Positive for myalgias.       Complains of pain over her right thigh as well as swelling  Complains of pain at the insertion of her right Achilles tendon  Neurological: Negative.   Psychiatric/Behavioral: Negative.          Objective:    BP 116/76   Pulse 76   Temp 98.1 F (36.7 C)   Ht 5' 3 (1.6 m)   Wt 200 lb (90.7 kg)   SpO2 98%   BMI 35.43 kg/m    Physical Exam Constitutional:       Appearance: Normal appearance.  HENT:     Head: Normocephalic and atraumatic.  Cardiovascular:     Rate and Rhythm: Normal rate and regular rhythm.     Heart sounds: Normal heart sounds.  Pulmonary:     Effort: Pulmonary effort is normal.     Breath sounds: Normal breath sounds. No wheezing, rhonchi or rales.  Abdominal:     General: Bowel sounds are normal. There is no distension.     Palpations: Abdomen is soft.     Tenderness: There is abdominal tenderness. There is no guarding or rebound.     Comments: Mild suprapubic tenderness to palpation  Musculoskeletal:        General: Swelling and tenderness present.     Right lower leg: Edema present.     Left lower leg: Edema present.     Comments: Trace to 1+ bilateral lower extremity pitting edema  Tenderness noted at the insertion of the Achilles tendon but no open wounds or sores noted  Mild tenderness and swelling noted over her right outer mid thigh with no overlying erythema or increased local warmth  Neurological:     Mental Status: She is alert.  Psychiatric:        Mood and Affect: Mood normal.        Behavior: Behavior normal.     No results found for any visits on 03/18/24.  Assessment & Plan:   Problem List Items Addressed This Visit       Cardiovascular and Mediastinum   Hypertension associated with diabetes (HCC)   - This problem is chronic and stable -Blood pressure today is well-controlled at 116/76 -We will continue with spironolactone  25 mg 3 times a week, carvedilol  6.25 mg twice daily -No further workup at this time        Musculoskeletal and Integument   Achilles tendinitis   - Patient complains of significant pain over the back of her ankle -She states that she had a sore there when she was admitted at a SNF but this healed -She was concerned that she has an infection despite the sore healing -On exam, she has no open wounds.  No erythema or increased local warmth -She does have  significant tenderness to palpation at the insertion of her right Achilles tendon -I suspect that she may have an Achilles tendinitis and have sent a message to her podiatrist to see if they could evaluate her sooner than her next appointment (November) -No further workup at this time        Genitourinary   UTI (urinary tract infection) - Primary   - Patient complains of increased urinary frequency as well as suprapubic abdominal pain particularly worse prior to her voiding -She had dropped off a urine sample yesterday which showed many bacteria as well as trace blood but no leukocytes or nitrites as well  -On exam today, she has mild suprapubic abdominal tenderness -Will follow-up urine culture -Given her symptoms as well as tenderness and the presence of many bacteria in her urine we will start her on Bactrim  to complete a 5-day course -No further workup at this time      Relevant Medications   sulfamethoxazole -trimethoprim  (BACTRIM  DS) 800-160 MG tablet     Other   Acute pain of right thigh   - Patient complains of significant pain and swelling over her right mid thigh.  No fevers or chills -On exam, she was noted to have mild swelling and some tenderness to palpation at that site -No erythema or increased local warmth noted -Patient does states she had a fall relatively recently and is on aspirin  and Plavix .  I suspect that patient may have a hematoma secondary to recent fall -Will obtain soft tissue ultrasound of her right lower extremity for further evaluation -Continue with Tylenol  as needed for pain (patient states that oxycodone  prescribed by the ED was not helping).  Would avoid NSAIDs given her CKD -No further workup at this time      Relevant Orders   US  RT LOWER EXTREM LTD SOFT TISSUE NON VASCULAR   Constipation   - Patient has a history of chronic constipation and is on senna for this -She needs a refill of this medication today -I have put in a refill for her -No  further workup at this time      Insomnia   - Patient states that she has not been sleeping secondary to her pain and that the trazodone  has been giving her nightmares -She stopped taking the trazodone  -Will reassess insomnia symptoms once her pain is better controlled       Meds ordered this encounter  Medications   senna-docusate (SENOKOT-S) 8.6-50 MG tablet    Sig: Take 1 tablet by mouth 2 (two) times daily.   sulfamethoxazole -trimethoprim  (BACTRIM  DS) 800-160 MG tablet    Sig: Take 1 tablet by mouth 2 (two) times daily.  Dispense:  10 tablet    Refill:  0    No follow-ups on file.  Alexis Vinton, MD

## 2024-03-19 ENCOUNTER — Telehealth: Payer: Self-pay

## 2024-03-19 NOTE — Progress Notes (Signed)
 Complex Care Management Note Care Guide Note  03/19/2024 Name: Alexis Farmer MRN: [id number] DOB: [birth year]   Complex Care Management Outreach Attempts: An unsuccessful telephone outreach was attempted today to offer the patient information about available complex care management services.  Follow Up Plan:  Additional outreach attempts will be made to offer the patient complex care management information and services.   Encounter Outcome:  No Answer  Dreama [facility]  [facility], [facility] VBCI Assistant Direct Dial: [phone]  Fax: [phone]

## 2024-03-20 ENCOUNTER — Ambulatory Visit: Payer: Self-pay | Admitting: Internal Medicine

## 2024-03-20 DIAGNOSIS — N3000 Acute cystitis without hematuria: Secondary | ICD-10-CM

## 2024-03-20 LAB — URINE CULTURE
MICRO NUMBER:: 17036084
SPECIMEN QUALITY:: ADEQUATE

## 2024-03-20 MED ORDER — CEFDINIR 300 MG PO CAPS: 300.0000 mg | ORAL_CAPSULE | Freq: Two times a day (BID) | ORAL | 0 refills | Status: DC

## 2024-03-20 NOTE — Assessment & Plan Note (Signed)
-  Urine culture showed E. coli sensitive to 3rd and 4th generation cephalosporins but resistant to Bactrim  and Keflex . -Not a candidate for Macrobid given her low GFR - Patient does have an allergy to penicillins but has been prescribed Keflex  in the past without any reactions -Will prescribe cefdinir to complete a 5-day course and DC her Bactrim  -No further workup at this time

## 2024-03-23 ENCOUNTER — Other Ambulatory Visit: Payer: Self-pay

## 2024-03-23 DIAGNOSIS — R5381 Other malaise: Secondary | ICD-10-CM

## 2024-03-23 DIAGNOSIS — M47816 Spondylosis without myelopathy or radiculopathy, lumbar region: Secondary | ICD-10-CM

## 2024-03-23 DIAGNOSIS — F32A Depression, unspecified: Secondary | ICD-10-CM

## 2024-03-23 DIAGNOSIS — I639 Cerebral infarction, unspecified: Secondary | ICD-10-CM

## 2024-03-23 DIAGNOSIS — I5032 Chronic diastolic (congestive) heart failure: Secondary | ICD-10-CM

## 2024-03-23 NOTE — Patient Instructions (Signed)
 Visit Information  Thank you for taking time to visit with me today. Please don't hesitate to contact me if I can be of assistance to you before our next scheduled appointment.  Your next care management appointment is by telephone on 04/20/2024 at 3:00 pm  Telephone follow-up in 1 month  Please call the care guide team at [phone] if you need to cancel, schedule, or reschedule an appointment.   Please call the Suicide and Crisis Lifeline: 988 call the [place]  National Suicide Prevention Lifeline: 6201392491 or TTY: (506)118-4847 TTY 205-244-2394) to talk to a trained counselor call 1-800-273-TALK (toll free, 24 hour hotline) go to [facility] [address], [place] [phone]) call 911 if you are experiencing a Mental Health or Behavioral Health Crisis or need someone to talk to.  Nestora Duos, MSN, RN [place]  [place], [facility] RN Care Manager Direct Dial: [phone] Fax: [phone]  Palliative Care Palliative care can help make your quality of life better if you have a very serious illness. It involves care of your body, mind, and spirit. It's based on what you need and want in this stage of life. In many cases, care may take place in a hospital or long-term care setting. It may include: Help with pain and other symptoms. Family support. Spiritual support. Emotional support. Social support. Palliative care can help bring you comfort and peace of mind. It can be helpful to you and your family during the course of an illness. What is the difference between palliative care and hospice? Palliative care and hospice have similar goals. They both aim to: Help with symptoms. Provide comfort. Make your quality of life better. Maintain your dignity. They're different in that palliative care can be given: At the same time as other treatments. During any phase of a serious illness, from diagnosis to  cure. Hospice care is given when: You're thought to have 6 months or less to live. You no longer can, or want to, try to find a cure for your illness. Who can get palliative care services? Palliative care is given to children and adults who are very ill. It may be offered if: You're not responding well to treatment. You need help with pain. You have side effects from treatment that are hard to manage. You have symptoms from the effects of surgery. You have been diagnosed with a disease that: Is advanced. Will shorten your life. Your health care provider may talk with you about palliative care if they think the support would help you. Your family and friends may also get help to manage stress and other concerns. Who makes up the palliative care team? The care team includes: You and your family. Your providers and specialists. Nurses. A Child psychotherapist, psychologist, or psychiatrist. Based on your needs, the team may also include: A pain specialist. A hospice specialist. Someone to help with finances or insurance. Religious or spiritual leaders. A case Production designer, theatre/television/film. A grief counselor. How will the palliative care team help me and my family? The team will speak with you and your family about: Your symptoms, such as: Pain. Nausea. Vomiting. Shortness of breath. The need for specialists. Advance directives. These may include: Living wills. Health care proxies. Symptoms that have to do with your mental health, such as: Stress. Depression. This is when you feel sad or hopeless. Anxiety. This is when you feel worried or nervous. Treatment options and how to keep as much as possible of your: Function. Ability to move  around. Spiritual wishes, such as: Rituals. Prayer. Things you can do to leave a legacy or make memories. Life and death as a normal process. End-of-life care. The team can also help you talk about: Hard issues. Spiritual concerns. Emotional concerns. Where to find  more information [employer] on Aging (NIA): BaseRingTones.pl National Hospice and Palliative Care Organization [facility]): [email or web address] This information is not intended to replace advice given to you by your health care provider. Make sure you discuss any questions you have with your health care provider. Document Revised: 10/22/2022 Document Reviewed: 10/22/2022 Elsevier Patient Education  2024 [employer].

## 2024-03-23 NOTE — Patient Outreach (Signed)
 Complex Care Management   Visit Note  03/23/2024  Name:  Alexis Farmer MRN: [id number] DOB: Nov 14, 1940  Situation: Referral received for Complex Care Management related to Heart Failure and Diabetes with Complications I obtained verbal consent from Patient.  Visit completed with Patient  on the phone  Background:   Past Medical History:  Diagnosis Date   Acute on chronic heart failure with preserved ejection fraction (HFpEF) (HCC) 04/01/2023   Anxiety 09/13/2015   Arthritis    Atypical chest pain 01/15/2021   Blackhead 10/24/2021   Bradycardia 07/17/2016   Formatting of this note might be different from the original.  Last Assessment & Plan:   Found to be bradycardic at outside hospital. They stopped metoprolol  and verapamil  and heart rate has been normal since then. Echo appears to have been reassuring at the outside facility. Appears to be doing much better with regards to this. She'll continue to monitor for recurrent symptoms. 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  Last Assessment & Plan:   Continues to have issues with this.  She is following with a specialist and it sounds as though they are planning on an MRI.  Benign exam today.   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 Dennise Deal of this note might be different from the original.  Dr. Dennise      Last Assessment & Plan:   Recheck kidney function today.   Constipation 11/17/2019   Cough 01/25/2016   Formatting of this note might be different from the original.  Last Assessment & Plan:   Minimal nighttime cough. Improves when she washes her pillows consistently. Suspect allergies contributing. She'll monitor.   Diabetes mellitus without complication (HCC)    one elevated reading/ no treatment   Disorder of rotator cuff 06/24/2018   Diverticulitis    Dysuria 03/22/2017   Formatting of this  note might be different from the original.  Last Assessment & Plan:   Symptoms concerning for UTI.  Will check urinalysis.   Edema of foot 04/08/2014   Formatting of this note might be different from the original.  Last Assessment & Plan:   Chronic pedal edema. Suspect venous insufficiency. No orthopnea or shortness of breath. Advised elevation of her legs. Consider compression stockings in the future.   Elevated troponin 12/15/2020   Essential hypertension 04/08/2014   Formatting of this note might be different from the original.  Last Assessment & Plan:   Well-controlled on recheck.  Continue current regimen.   Fall 03/26/2018   Gastroenteritis 08/12/2021   Gastrointestinal hemorrhage 08/03/2015   GI bleed    High cholesterol    History of blood transfusion    Hyperglycemia due to type 2 diabetes mellitus (HCC) 03/11/2015   Hypertension    Hypertensive urgency 12/15/2020   Low back pain 04/08/2014   Morbid obesity (HCC) 04/08/2014   Formatting of this note might be different from the original.  Last Assessment & Plan:   Weight is stable. Discussed diet and exercise at length. Encouraged whatever exercise she can do. Given diet information.   Palpitations 12/15/2020   Peripheral edema 12/22/2019   Postmenopausal bleeding 07/17/2016   Formatting of this note might be different from the original.  Last Assessment & Plan:   Recent D&C. Following with gynecology. Benign findings. Monitor for recurrence.   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  Last Assessment & Plan:   Continues to have issues with this.  She is following with a specialist and it sounds as though they are planning on an MRI.  Benign exam today.   Recurrent falls 08/13/2019   Renal insufficiency    Skin cyst 10/24/2021   Stage 3a chronic kidney disease (HCC) 01/15/2021    Swelling of lower leg 07/19/2016   Vertigo 10/13/2015   Formatting of this note might be different from the original.  Last Assessment & Plan:   No recurrence. Discussed that meclizine  is an as needed medication and that she does not need to take this daily. She will continue to monitor.    Assessment: Patient Reported Symptoms:  Cognitive Cognitive Status: Struggling with memory recall, Alert and oriented to person, place, and time, Insightful and able to interpret abstract concepts, Normal speech and language skills Cognitive/Intellectual Conditions Management [RPT]: None reported or documented in medical history or problem list   Health Maintenance Behaviors: Annual physical exam  Neurological   Neurological Comment: pain on right leg,  numbness and weakness on left  HEENT HEENT Symptoms Reported: No symptoms reported HEENT Comment: no longer having issues with tinitus    Cardiovascular Cardiovascular Symptoms Reported: Swelling in legs or feet Cardiovascular Comment: denies HF sx, cannot weigh self and family not comfortable assisting  Respiratory Respiratory Symptoms Reported: No symptoms reported Respiratory Management Strategies: Routine screening, Medication therapy  Endocrine Endocrine Symptoms Reported: Hypoglycemia Is patient diabetic?: Yes Is patient checking blood sugars at home?: Yes List most recent blood sugar readings, include date and time of day: FBG 172 today, reports several low BG during night, always has snack before bed, still gets low, states episode could not move - only able to call for help, declines [facility] referral for dsease management    Gastrointestinal Gastrointestinal Symptoms Reported: Nausea, Constipation Additional Gastrointestinal Details: occasional nausea when upset/worried Gastrointestinal Comment: appetite ok  - states family does not cook, most food does not taste good, meeting with BSW tomorrow re Meals on Wheels, discussed protein drinks - has  them, does not really like,    Genitourinary Genitourinary Symptoms Reported: Incontinence Additional Genitourinary Details: cefdinir for UTI - sx improved will finsih all med and notify provider if sx recur. Genitourinary Management Strategies: Medication therapy, Incontinence garment/pad  Integumentary Integumentary Symptoms Reported: No symptoms reported Skin Management Strategies: Routine screening  Musculoskeletal Musculoskelatal Symptoms Reviewed: Back pain, Joint pain, Muscle pain, Limited mobility, Unsteady gait, Difficulty walking, Weakness Additional Musculoskeletal Details: RNCM will send message to PCp re pain - lidocaine  patch.pain med completed Musculoskeletal Management Strategies: Routine screening Falls in the past year?: Yes Number of falls in past year: 1 or less Was there an injury with Fall?: Yes Fall Risk Category Calculator: 2 Patient Fall Risk Level: Moderate Fall Risk Patient at Risk for Falls Due to: History of fall(s), Impaired balance/gait, Impaired mobility (ED for head bumps - anticoagulent) Fall risk Follow up: Falls evaluation completed, Falls prevention discussed  Psychosocial Psychosocial Symptoms Reported: Sadness - if selected complete PHQ 2-9, Depression - if selected complete PHQ 2-9 Additional Psychological Details: depressed and sad, talks about needed ot change situation -declines LCSW agreed to Palliative Care   Major Change/Loss/Stressor/Fears (CP): Medical condition, self, Environment Techniques to [employer] with Loss/Stress/Change: Diversional activities      03/23/2024    PHQ2-9 Depression Screening  Little interest or pleasure in doing things Nearly every day  Feeling down, depressed, or hopeless More than half the days  PHQ-2 - Total Score 5  Trouble falling or staying asleep, or sleeping too much Nearly every day  Feeling tired or having little energy Nearly every day  Poor appetite or overeating  More than half the days  Feeling bad about  yourself - or that you are a failure or have let yourself or your family down Not at all  Trouble concentrating on things, such as reading the newspaper or watching television Several days  Moving or speaking so slowly that other people could have noticed.  Or the opposite - being so fidgety or restless that you have been moving around a lot more than usual Not at all  Thoughts that you would be better off dead, or hurting yourself in some way Not at all  PHQ2-9 Total Score 14  If you checked off any problems, how difficult have these problems made it for you to do your work, take care of things at home, or get along with other people Somewhat difficult  Depression Interventions/Treatment Patient refuses Treatment    There were no vitals filed for this visit.  Medications Reviewed Today   Medications were not reviewed in this encounter     Recommendation:   PCP Follow-up Continue Current Plan of Care  Follow Up Plan:   Telephone follow-up in 1 month  Nestora Duos, MSN, RN [facility]  [facility], [facility] RN Care Manager Direct Dial: [phone] Fax: [phone]

## 2024-03-23 NOTE — Progress Notes (Signed)
 Complex Care Management Note  Care Guide Note 03/23/2024 Name: Alexis Farmer MRN: [id number] DOB: 1940/08/14  [name] is a 83 y.o. year old female who sees Bair, Kalpana, MD for primary care. I reached out to [employer] by phone today to offer complex care management services.  Ms. Standish was given information about Complex Care Management services today including:   The Complex Care Management services include support from the care team which includes your Nurse Care Manager, Clinical Social Worker, or Pharmacist.  The Complex Care Management team is here to help remove barriers to the health concerns and goals most important to you. Complex Care Management services are voluntary, and the patient may decline or stop services at any time by request to their care team member.   Complex Care Management Consent Status: Patient agreed to services and verbal consent obtained.   Follow up plan:  Telephone appointment with complex care management team member scheduled for:  03/24/24 at 3:30 p.m.   Encounter Outcome:  Patient Scheduled  D[facility]  [facility], [facility] VBCI Assistant Direct Dial: [phone]  Fax: [phone]

## 2024-03-24 ENCOUNTER — Other Ambulatory Visit: Payer: Self-pay

## 2024-03-24 DIAGNOSIS — M79604 Pain in right leg: Secondary | ICD-10-CM | POA: Insufficient documentation

## 2024-03-24 MED ORDER — LIDOCAINE 5 % EX PTCH: 1.0000 | MEDICATED_PATCH | CUTANEOUS | 0 refills | Status: DC

## 2024-03-24 NOTE — Patient Instructions (Signed)
 Visit Information  Thank you for taking time to visit with me today. Please don't hesitate to contact me if I can be of assistance to you before our next scheduled appointment.  Our next appointment is by telephone on 04/03/24 at 12pm Please call the care guide team at [phone] if you need to cancel or reschedule your appointment.   Following is a copy of your care plan:   Goals Addressed             This Visit's Progress    BSW VBCI Social Work Care Plan       Problems:   Food Insecurity   CSW Clinical Goal(s):   Over the next 30 days the Patient will work with Child psychotherapist to address concerns related to MOW.  Interventions:  SW will complete a referral for MOW of [facility]  Patient Goals/Self-Care Activities:  Patient will use MOW for food delivery  Plan:   The care management team will reach out to the patient again over the next 10 days.        Please call the Suicide and Crisis Lifeline: 988 call the [place]  National Suicide Prevention Lifeline: 317-451-3680 or TTY: (432)367-7380 TTY 854 218 5719) to talk to a trained counselor call 1-800-273-TALK (toll free, 24 hour hotline) call 911 if you are experiencing a Mental Health or Behavioral Health Crisis or need someone to talk to.  The patient verbalized understanding of instructions, educational materials, and care plan provided today and DECLINED offer to receive copy of patient instructions, educational materials, and care plan.   Thersia Hoar, HEDWIG, MHA [place]  [facility] Social Worker, Population Health [phone]

## 2024-03-24 NOTE — Progress Notes (Signed)
 1. Right leg pain (Primary) - lidocaine  (LIDODERM ) 5 %; Place 1 patch onto the skin daily. Remove & Discard patch within 12 hours or as directed by MD  Dispense: 45 patch; Refill: 0  Luke Shade, MD

## 2024-03-24 NOTE — Patient Outreach (Signed)
 Complex Care Management   Visit Note  03/24/2024  Name:  Alexis Farmer MRN: [id number] DOB: [birth year]  Situation: Referral received for Complex Care Management related to SDOH Barriers:  Food insecurity I obtained verbal consent from Patient.  Visit completed with Patient  on the phone  Background:   Past Medical History:  Diagnosis Date   Acute on chronic heart failure with preserved ejection fraction (HFpEF) (HCC) 04/01/2023   Anxiety 09/13/2015   Arthritis    Atypical chest pain 01/15/2021   Blackhead 10/24/2021   Bradycardia 07/17/2016   Formatting of this note might be different from the original.  Last Assessment & Plan:   Found to be bradycardic at outside hospital. They stopped metoprolol  and verapamil  and heart rate has been normal since then. Echo appears to have been reassuring at the outside facility. Appears to be doing much better with regards to this. She'll continue to monitor for recurrent symptoms. 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  Last Assessment & Plan:   Continues to have issues with this.  She is following with a specialist and it sounds as though they are planning on an MRI.  Benign exam today.   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 Dennise Deal of this note might be different from the original.  Dr. Dennise      Last Assessment & Plan:   Recheck kidney function today.   Constipation 11/17/2019   Cough 01/25/2016   Formatting of this note might be different from the original.  Last Assessment & Plan:   Minimal nighttime cough. Improves when she washes her pillows consistently. Suspect allergies contributing. She'll monitor.   Diabetes mellitus without complication (HCC)    one elevated reading/ no treatment   Disorder of rotator cuff 06/24/2018   Diverticulitis    Dysuria 03/22/2017   Formatting of this note might be  different from the original.  Last Assessment & Plan:   Symptoms concerning for UTI.  Will check urinalysis.   Edema of foot 04/08/2014   Formatting of this note might be different from the original.  Last Assessment & Plan:   Chronic pedal edema. Suspect venous insufficiency. No orthopnea or shortness of breath. Advised elevation of her legs. Consider compression stockings in the future.   Elevated troponin 12/15/2020   Essential hypertension 04/08/2014   Formatting of this note might be different from the original.  Last Assessment & Plan:   Well-controlled on recheck.  Continue current regimen.   Fall 03/26/2018   Gastroenteritis 08/12/2021   Gastrointestinal hemorrhage 08/03/2015   GI bleed    High cholesterol    History of blood transfusion    Hyperglycemia due to type 2 diabetes mellitus (HCC) 03/11/2015   Hypertension    Hypertensive urgency 12/15/2020   Low back pain 04/08/2014   Morbid obesity (HCC) 04/08/2014   Formatting of this note might be different from the original.  Last Assessment & Plan:   Weight is stable. Discussed diet and exercise at length. Encouraged whatever exercise she can do. Given diet information.   Palpitations 12/15/2020   Peripheral edema 12/22/2019   Postmenopausal bleeding 07/17/2016   Formatting of this note might be different from the original.  Last Assessment & Plan:   Recent D&C. Following with gynecology. Benign findings. Monitor for recurrence.   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  Last Assessment & Plan:   Continues to have issues with this.  She is following with a specialist and it sounds as though they are planning on an MRI.  Benign exam today.   Recurrent falls 08/13/2019   Renal insufficiency    Skin cyst 10/24/2021   Stage 3a chronic kidney disease (HCC) 01/15/2021   Swelling of lower  leg 07/19/2016   Vertigo 10/13/2015   Formatting of this note might be different from the original.  Last Assessment & Plan:   No recurrence. Discussed that meclizine  is an as needed medication and that she does not need to take this daily. She will continue to monitor.    Assessment: SW completed a telephone outreach with patient, she states her only concern is getting MOW. SW informed patient the watilist is about 6 months long, patient was okay with the length and okay with SW completing the referral. SW contacted MOW and left a voicemail for a call back. Patient declined all other resources at this time.  SDOH Interventions    Flowsheet Row Patient Outreach Telephone from 03/23/2024 in [place] POPULATION HEALTH DEPARTMENT Patient Outreach Telephone from 02/12/2024 in [place] POPULATION HEALTH DEPARTMENT Patient Outreach Telephone from 01/15/2024 in [place] POPULATION HEALTH DEPARTMENT Patient Outreach Telephone from 01/01/2024 in [place] POPULATION HEALTH DEPARTMENT ED to Hosp-Admission (Discharged) from 09/29/2023 in [facility]IONAL CARDIAC MED PCU Care Coordination from 09/23/2023 in [place] POPULATION HEALTH DEPARTMENT  SDOH Interventions        Food Insecurity Interventions -- Intervention Not Indicated Intervention Not Indicated Intervention Not Indicated Intervention Not Indicated Intervention Not Indicated  Housing Interventions -- Intervention Not Indicated Intervention Not Indicated Intervention Not Indicated Intervention Not Indicated Intervention Not Indicated  Transportation Interventions -- Intervention Not Indicated Intervention Not Indicated Intervention Not Indicated Intervention Not Indicated --  [facility]  Utilities Interventions -- Intervention Not Indicated Intervention Not Indicated Intervention Not Indicated -- Intervention Not Indicated  Depression Interventions/Treatment  Patient refuses Treatment -- -- Counseling -- Counseling   Financial Strain Interventions -- -- -- -- Inpatient TOC --  Physical Activity Interventions -- -- -- -- Patient Declined --    Recommendation:   No recommendations at this time  Follow Up Plan:   Telephone follow-up 04/03/24 at 12pm  Thersia Hoar, BSW, MHA [place]  [facility] Social Worker, Population Health [phone]

## 2024-03-24 NOTE — Progress Notes (Signed)
 Patient notified and made aware of Dr Graylon recommendations. Patient states she is OK with Palliative Care if there is an option of them being able to come to her home. Patient states she is also scheduled for an lower extremity US  on Friday for her legs. Provider RICK

## 2024-03-25 ENCOUNTER — Ambulatory Visit: Payer: Self-pay

## 2024-03-25 NOTE — Progress Notes (Signed)
 1. Cerebrovascular accident (CVA), unspecified mechanism (HCC) - AMB Referral VBCI Care Management - Amb Referral to Palliative Care  2. Depression, unspecified depression type - AMB Referral VBCI Care Management - Amb Referral to Palliative Care  3. Arthritis of lumbar spine - Amb Referral to Palliative Care  4. Chronic diastolic CHF (congestive heart failure) (HCC) - Amb Referral to Palliative Care  5. Physical deconditioning (Primary) - Amb Referral to Palliative Care  Palliative care referral made.  Luke Shade, MD

## 2024-03-25 NOTE — Addendum Note (Signed)
 Addended by: Janaiah Vetrano on: 03/25/2024 07:53 AM   Modules accepted: Orders

## 2024-03-25 NOTE — Telephone Encounter (Signed)
 FYI Only or Action Required?: Action required by provider: update on patient condition.  Patient was last seen in primary care on 03/18/2024 by Onesimo Claude, MD.  Called Nurse Triage reporting Dysuria, Abdominal Pain, and Urinary Retention.  Symptoms began a week ago.  Interventions attempted: Prescription medications: cefdinir, lidocaine  patches. Patient states she does not think she was drinking enough fluids and plans to drink more water today.  Symptoms are: urinary retention, burning after urination, right flank pain radiates to abdomen, nausea, decreased appetite, leg pain gradually worsening.  Triage Disposition: See HCP Within 4 Hours (Or PCP Triage)  Patient/caregiver understands and will follow disposition?: No, wishes to speak with PCP          Copied from CRM #[id number]. Topic: Clinical - Red Word Triage >> Mar 25, 2024  2:08 PM Rosina BIRCH wrote: Reason for CRM: stomach pain and when she try to urinate there is nothing there but pain,but she does urinate Reason for Disposition  [1] Taking antibiotic > 24 hours for UTI AND [2] flank or lower back pain getting WORSE  Answer Assessment - Initial Assessment Questions Offered appointments in office with providers this week, patient states she can not be seen as she requires 2 day notice with transportation. 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  1. MAIN SYMPTOM: What is the main symptom you are concerned about? (e.g., painful urination, urine frequency)     Pain after urination and feels the urge to urinate shortly after but then experiences retention.  2. BETTER-SAME-WORSE: Are you getting better, staying the same, or getting worse compared to how you felt at your last visit to the doctor (most recent medical visit)?     Worse.  3. PAIN: How bad is the pain?  (e.g., Scale  1-10; mild, moderate, or severe)     Severe, she states about 8/10 and is crying. She states the pain starts on her right side of her flank and comes around to her abdomen.  4. FEVER: Do you have a fever? If Yes, ask: What is it, how was it measured, and when did it start?     No.  5. OTHER SYMPTOMS: Do you have any other symptoms? (e.g., blood in the urine, flank pain, vaginal discharge)     Nausea, right flank pain, decreased appetite, leg pain. She states she has not been drinking enough water.  6. DIAGNOSIS: When was the UTI diagnosed? By whom? Was it a kidney infection, bladder infection or both?     03/18/24. Bladder infection.  7. ANTIBIOTIC: What antibiotic(s) are you taking? How many times per day?     Cefdinir, twice daily. Last dose was taken this morning.  8. ANTIBIOTIC - START DATE: When did you start taking the antibiotic?     03/20/24.  Protocols used: Urinary Tract Infection on Antibiotic Follow-up Call - [facility]

## 2024-03-25 NOTE — Telephone Encounter (Signed)
 Called Patient and she is upset and said she does not have transportation but she will call the ambulance. Patient states she has an US  tomorrow. Patient also asked was this her kidney doctor office and I said no maam you called your PCP office [facility].

## 2024-03-26 NOTE — Telephone Encounter (Signed)
 Copied from CRM [id number]. Topic: General - Call Back - No Documentation >> Mar 26, 2024  1:21 PM Alfonso ORN wrote: Reason for CRM: pt stated she is trying to avoid going  to the ED location and plans to wait it out until her appointment on Wednesday. She stated that her symptoms are all the same and have not gotten worse.

## 2024-03-26 NOTE — Telephone Encounter (Signed)
 Left message to call the office back to see how she is doing and if she went to the ED?

## 2024-03-27 ENCOUNTER — Ambulatory Visit
Admission: RE | Admit: 2024-03-27 | Discharge: 2024-03-27 | Disposition: A | Discharge status: Home / Self Care | Source: Ambulatory Visit | Attending: Internal Medicine | Admitting: Internal Medicine

## 2024-03-27 DIAGNOSIS — M79651 Pain in right thigh: Secondary | ICD-10-CM | POA: Insufficient documentation

## 2024-03-30 ENCOUNTER — Ambulatory Visit: Payer: Self-pay | Admitting: Internal Medicine

## 2024-03-30 NOTE — Telephone Encounter (Signed)
 Patient called. Relayed message to the Patient.

## 2024-03-31 ENCOUNTER — Ambulatory Visit (INDEPENDENT_AMBULATORY_CARE_PROVIDER_SITE_OTHER)

## 2024-03-31 ENCOUNTER — Encounter: Payer: Self-pay | Admitting: Podiatry

## 2024-03-31 ENCOUNTER — Ambulatory Visit (INDEPENDENT_AMBULATORY_CARE_PROVIDER_SITE_OTHER): Admitting: Podiatry

## 2024-03-31 VITALS — Ht 63.0 in | Wt 200.0 lb

## 2024-03-31 DIAGNOSIS — M2141 Flat foot [pes planus] (acquired), right foot: Secondary | ICD-10-CM

## 2024-03-31 DIAGNOSIS — M2142 Flat foot [pes planus] (acquired), left foot: Secondary | ICD-10-CM

## 2024-03-31 DIAGNOSIS — M7751 Other enthesopathy of right foot: Secondary | ICD-10-CM | POA: Diagnosis not present

## 2024-04-02 NOTE — Telephone Encounter (Signed)
 Copied from CRM #[id number]. Topic: Clinical - Request for Lab/Test Order >> Apr 02, 2024  8:28 AM Revonda D wrote: Reason for CRM: Pt stated that she is experiencing pain in her right hip and stated that it goes down to her leg. Pt stated that she would like for some labs to be ordered for her hip and would like a callback with an update. Pt declined speaking with nurse triage.

## 2024-04-03 ENCOUNTER — Other Ambulatory Visit: Payer: Self-pay

## 2024-04-03 NOTE — Patient Instructions (Signed)

## 2024-04-03 NOTE — Patient Outreach (Signed)
 Complex Care Management   Visit Note  04/03/2024  Name:  Alexis Farmer MRN: [id number] DOB: 12/05/1940  Situation: Referral received for Complex Care Management related to MOW  I obtained verbal consent from Patient.  Visit completed with Patient  on the phone  Background:   Past Medical History:  Diagnosis Date   Acute on chronic heart failure with preserved ejection fraction (HFpEF) (HCC) 04/01/2023   Anxiety 09/13/2015   Arthritis    Atypical chest pain 01/15/2021   Blackhead 10/24/2021   Bradycardia 07/17/2016   Formatting of this note might be different from the original.  Last Assessment & Plan:   Found to be bradycardic at outside hospital. They stopped metoprolol  and verapamil  and heart rate has been normal since then. Echo appears to have been reassuring at the outside facility. Appears to be doing much better with regards to this. She'll continue to monitor for recurrent symptoms. 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  Last Assessment & Plan:   Continues to have issues with this.  She is following with a specialist and it sounds as though they are planning on an MRI.  Benign exam today.   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 Dennise Deal of this note might be different from the original.  Dr. Dennise      Last Assessment & Plan:   Recheck kidney function today.   Constipation 11/17/2019   Cough 01/25/2016   Formatting of this note might be different from the original.  Last Assessment & Plan:   Minimal nighttime cough. Improves when she washes her pillows consistently. Suspect allergies contributing. She'll monitor.   Diabetes mellitus without complication (HCC)    one elevated reading/ no treatment   Disorder of rotator cuff 06/24/2018   Diverticulitis    Dysuria 03/22/2017   Formatting of this note might be different from the  original.  Last Assessment & Plan:   Symptoms concerning for UTI.  Will check urinalysis.   Edema of foot 04/08/2014   Formatting of this note might be different from the original.  Last Assessment & Plan:   Chronic pedal edema. Suspect venous insufficiency. No orthopnea or shortness of breath. Advised elevation of her legs. Consider compression stockings in the future.   Elevated troponin 12/15/2020   Essential hypertension 04/08/2014   Formatting of this note might be different from the original.  Last Assessment & Plan:   Well-controlled on recheck.  Continue current regimen.   Fall 03/26/2018   Gastroenteritis 08/12/2021   Gastrointestinal hemorrhage 08/03/2015   GI bleed    High cholesterol    History of blood transfusion    Hyperglycemia due to type 2 diabetes mellitus (HCC) 03/11/2015   Hypertension    Hypertensive urgency 12/15/2020   Low back pain 04/08/2014   Morbid obesity (HCC) 04/08/2014   Formatting of this note might be different from the original.  Last Assessment & Plan:   Weight is stable. Discussed diet and exercise at length. Encouraged whatever exercise she can do. Given diet information.   Palpitations 12/15/2020   Peripheral edema 12/22/2019   Postmenopausal bleeding 07/17/2016   Formatting of this note might be different from the original.  Last Assessment & Plan:   Recent D&C. Following with gynecology. Benign findings. Monitor for recurrence.   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  Last Assessment & Plan:   Continues to have issues with this.  She is following with a specialist and it sounds as though they are planning on an MRI.  Benign exam today.   Recurrent falls 08/13/2019   Renal insufficiency    Skin cyst 10/24/2021   Stage 3a chronic kidney disease (HCC) 01/15/2021   Swelling of lower leg 07/19/2016    Vertigo 10/13/2015   Formatting of this note might be different from the original.  Last Assessment & Plan:   No recurrence. Discussed that meclizine  is an as needed medication and that she does not need to take this daily. She will continue to monitor.    Assessment: SW completed a telephone outreach with patient, SW informed she has been placed on the waitlist for MOW. SW provided patient with the telephone number for MOW for any questions that may come up. Patient declines any resources at this time.  SDOH Interventions    Flowsheet Row Patient Outreach Telephone from 03/23/2024 in [place] POPULATION HEALTH DEPARTMENT Patient Outreach Telephone from 02/12/2024 in [place] POPULATION HEALTH DEPARTMENT Patient Outreach Telephone from 01/15/2024 in [place] POPULATION HEALTH DEPARTMENT Patient Outreach Telephone from 01/01/2024 in [place] POPULATION HEALTH DEPARTMENT ED to Hosp-Admission (Discharged) from 09/29/2023 in [facility]IONAL CARDIAC MED PCU Care Coordination from 09/23/2023 in [place] POPULATION HEALTH DEPARTMENT  SDOH Interventions        Food Insecurity Interventions -- Intervention Not Indicated Intervention Not Indicated Intervention Not Indicated Intervention Not Indicated Intervention Not Indicated  Housing Interventions -- Intervention Not Indicated Intervention Not Indicated Intervention Not Indicated Intervention Not Indicated Intervention Not Indicated  Transportation Interventions -- Intervention Not Indicated Intervention Not Indicated Intervention Not Indicated Intervention Not Indicated --  [facility]  Utilities Interventions -- Intervention Not Indicated Intervention Not Indicated Intervention Not Indicated -- Intervention Not Indicated  Depression Interventions/Treatment  Patient refuses Treatment -- -- Counseling -- Counseling  Financial Strain Interventions -- -- -- -- Inpatient TOC --  Physical Activity Interventions -- -- -- --  Patient Declined --    Recommendation:   No recommendations at this time  Follow Up Plan:   Patient has met all care management goals. Care Management case will be closed. Patient has been provided contact information should new needs arise.   Thersia Hoar, HEDWIG, MHA [place]  [facility] Social Worker, Population Health [phone]

## 2024-04-06 NOTE — Progress Notes (Signed)
 Chief Complaint  Patient presents with   Foot Pain    Pt is here due to right ankle pain, states the pain has been going on since May, states the pain runs from the hip down the leg into ankle, recently had vascular ultrasound done for the lower extremities, was prescribe lidocaine  patch to help with pain by PCP, states the area is tender to touch when the pain began it hurt putting any pressure onto the ankle.    HPI: 83 y.o. female presenting today for above complaint  Past Medical History:  Diagnosis Date   Acute on chronic heart failure with preserved ejection fraction (HFpEF) (HCC) 04/01/2023   Anxiety 09/13/2015   Arthritis    Atypical chest pain 01/15/2021   Blackhead 10/24/2021   Bradycardia 07/17/2016   Formatting of this note might be different from the original.  Last Assessment & Plan:   Found to be bradycardic at outside hospital. They stopped metoprolol  and verapamil  and heart rate has been normal since then. Echo appears to have been reassuring at the outside facility. Appears to be doing much better with regards to this. She'll continue to monitor for recurrent symptoms. 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  Last Assessment & Plan:   Continues to have issues with this.  She is following with a specialist and it sounds as though they are planning on an MRI.  Benign exam today.   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 Dennise Deal of this note might be different from the original.  Dr. Dennise      Last Assessment & Plan:   Recheck kidney function today.   Constipation 11/17/2019   Cough 01/25/2016   Formatting of this note might be different from the original.  Last Assessment & Plan:   Minimal nighttime cough. Improves when she washes her pillows consistently. Suspect allergies contributing. She'll monitor.   Diabetes mellitus without  complication (HCC)    one elevated reading/ no treatment   Disorder of rotator cuff 06/24/2018   Diverticulitis    Dysuria 03/22/2017   Formatting of this note might be different from the original.  Last Assessment & Plan:   Symptoms concerning for UTI.  Will check urinalysis.   Edema of foot 04/08/2014   Formatting of this note might be different from the original.  Last Assessment & Plan:   Chronic pedal edema. Suspect venous insufficiency. No orthopnea or shortness of breath. Advised elevation of her legs. Consider compression stockings in the future.   Elevated troponin 12/15/2020   Essential hypertension 04/08/2014   Formatting of this note might be different from the original.  Last Assessment & Plan:   Well-controlled on recheck.  Continue current regimen.   Fall 03/26/2018   Gastroenteritis 08/12/2021   Gastrointestinal hemorrhage 08/03/2015   GI bleed    High cholesterol    History of blood transfusion    Hyperglycemia due to type 2 diabetes mellitus (HCC) 03/11/2015   Hypertension    Hypertensive urgency 12/15/2020   Low back pain 04/08/2014   Morbid obesity (HCC) 04/08/2014   Formatting of this note might be different from the original.  Last Assessment & Plan:   Weight is stable. Discussed diet and exercise at length. Encouraged whatever exercise she can do. Given diet information.   Palpitations 12/15/2020   Peripheral edema  12/22/2019   Postmenopausal bleeding 07/17/2016   Formatting of this note might be different from the original.  Last Assessment & Plan:   Recent D&C. Following with gynecology. Benign findings. Monitor for recurrence.   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  Last Assessment & Plan:   Continues to have issues with this.  She is following with a specialist and it sounds as though they are planning on an  MRI.  Benign exam today.   Recurrent falls 08/13/2019   Renal insufficiency    Skin cyst 10/24/2021   Stage 3a chronic kidney disease (HCC) 01/15/2021   Swelling of lower leg 07/19/2016   Vertigo 10/13/2015   Formatting of this note might be different from the original.  Last Assessment & Plan:   No recurrence. Discussed that meclizine  is an as needed medication and that she does not need to take this daily. She will continue to monitor.    Past Surgical History:  Procedure Laterality Date   APPENDECTOMY     CHOLECYSTECTOMY     ECTOPIC PREGNANCY SURGERY     EYE SURGERY     bilateral cataracts   EYE SURGERY     02/11/2019 repair hole in right eye    gallbladder      HYSTEROSCOPY WITH D & C N/A 10/26/2016   Procedure: DILATATION AND CURETTAGE /HYSTEROSCOPY;  Surgeon: Verdon Keen, MD;  Location: [facility] ORS;  Service: Gynecology;  Laterality: N/A;   HYSTEROSCOPY WITH D & C N/A 07/07/2018   Procedure: DILATATION AND CURETTAGE /HYSTEROSCOPY;  Surgeon: Verdon Keen, MD;  Location: [facility] ORS;  Service: Gynecology;  Laterality: N/A;   RIGHT HEART CATH Bilateral 10/10/2023   Procedure: RIGHT HEART CATH;  Surgeon: Cherrie Toribio SAUNDERS, MD;  Location: [facility] INVASIVE CV LAB;  Service: Cardiovascular;  Laterality: Bilateral;   THYROIDECTOMY, PARTIAL      Allergies  Allergen Reactions   Celexa  [Citalopram ]     Diarrhea upset stomach    Jardiance  [Empagliflozin ] Other (See Comments)    Reaction not recalled   Norvasc  [Amlodipine ]     Leg edema   Tape Other (See Comments)    Leaves the skin raw if left on for a period of time- tolerates paper tape   Penicillin V Rash   Penicillin V Potassium Rash   Penicillins Rash    Has patient had a PCN reaction causing immediate rash, facial/tongue/throat swelling, SOB or lightheadedness with hypotension: Yes Has patient had a PCN reaction causing severe rash involving mucus membranes or skin necrosis: No Has patient had a PCN reaction that required  hospitalization No Has patient had a PCN reaction occurring within the last 10 years: Yes If all of the above answers are NO, then may proceed with Cephalosporin use.      Physical Exam: General: The patient is alert and oriented x3 in no acute distress.  Dermatology: Skin is warm, dry and supple bilateral lower extremities.   Vascular: Palpable pedal pulses bilaterally. Capillary refill within normal limits.  No appreciable edema.  No erythema.  Neurological: Grossly intact via light touch  Musculoskeletal Exam: Tenderness to the right foot and ankle but extending all the way up into the leg and thigh and knee  Radiographic Exam RT foot and ankle 03/31/2024:  Osseous demineralization.  No acute fractures identified.  Chronic erosive arthritis noted  Assessment/Plan of Care: 1.  Suspicion for  sciatica or a lumbar type radiculopathy  -Patient evaluated.  X-rays reviewed -Recommend follow-up with PCP for management of sciatica or lumbar radiculopathy.  Pain extends down her entire right lower extremity -Explained to the patient that I do not believe that her symptoms are simply coming from her foot or ankle. -Return to clinic PRN       Thresa EMERSON Sar, DPM [facility]  Dr. Thresa EMERSON Sar, DPM    [address] [place], [place] [address]                Office [phone]  Fax [phone]

## 2024-04-15 ENCOUNTER — Other Ambulatory Visit: Payer: Self-pay

## 2024-04-15 DIAGNOSIS — E1129 Type 2 diabetes mellitus with other diabetic kidney complication: Secondary | ICD-10-CM

## 2024-04-15 MED ORDER — ACCU-CHEK GUIDE TEST VI STRP
ORAL_STRIP | 12 refills | Status: DC
Start: 2024-04-15 — End: 2024-04-16

## 2024-04-15 NOTE — Telephone Encounter (Signed)
 Copied from CRM [id number]. Topic: Clinical - Medication Refill >> Apr 15, 2024 10:38 AM Tanazia G wrote: Medication: glucose blood (ACCU-CHEK GUIDE TEST) test strip Requesting 90 days 3 vials  Has the patient contacted their pharmacy? Yes (Agent: If no, request that the patient contact the pharmacy for the refill. If patient does not wish to contact the pharmacy document the reason why and proceed with request.) (Agent: If yes, when and what did the pharmacy advise?)  This is the patient's preferred pharmacy:  [facility] [phone] [place], [place] - [address] [place] [address] Phone: [phone] Fax: [phone]  Is this the correct pharmacy for this prescription? Yes If no, delete pharmacy and type the correct one.   Has the prescription been filled recently? Yes  Is the patient out of the medication? Yes  Has the patient been seen for an appointment in the last year OR does the patient have an upcoming appointment? Yes  Can we respond through MyChart? Yes  Agent: Please be advised that Rx refills may take up to 3 business days. We ask that you follow-up with your pharmacy.

## 2024-04-16 MED ORDER — ACCU-CHEK GUIDE TEST VI STRP: ORAL_STRIP | 3 refills | Status: AC

## 2024-04-16 NOTE — Addendum Note (Signed)
 Addended by: Brynlynn Walko on: 04/16/2024 11:09 AM   Modules accepted: Orders

## 2024-04-16 NOTE — Telephone Encounter (Unsigned)
 Copied from CRM [id number]. Topic: Clinical - Prescription Issue >> Apr 16, 2024  9:01 AM Harlene ORN wrote: Reason for CRM: Patient called about her blood sugar test strips. The one month supply of test strips will cost her $15 dollars out of pocket, versus the 3 month supply of test strips that would be completely covered by her insurance, no out of pocket payment needed. Please discuss with the patient and pharmacy about upping the quantity to 3 months worth of strips for complete insurance coverage.

## 2024-04-17 ENCOUNTER — Telehealth: Payer: Self-pay

## 2024-04-17 DIAGNOSIS — F410 Panic disorder [episodic paroxysmal anxiety] without agoraphobia: Secondary | ICD-10-CM

## 2024-04-17 DIAGNOSIS — I890 Lymphedema, not elsewhere classified: Secondary | ICD-10-CM

## 2024-04-17 DIAGNOSIS — I5032 Chronic diastolic (congestive) heart failure: Secondary | ICD-10-CM

## 2024-04-17 DIAGNOSIS — N184 Chronic kidney disease, stage 4 (severe): Secondary | ICD-10-CM

## 2024-04-17 DIAGNOSIS — E1129 Type 2 diabetes mellitus with other diabetic kidney complication: Secondary | ICD-10-CM

## 2024-04-17 DIAGNOSIS — I6523 Occlusion and stenosis of bilateral carotid arteries: Secondary | ICD-10-CM

## 2024-04-17 DIAGNOSIS — E782 Mixed hyperlipidemia: Secondary | ICD-10-CM

## 2024-04-17 DIAGNOSIS — R5381 Other malaise: Secondary | ICD-10-CM

## 2024-04-17 DIAGNOSIS — F39 Unspecified mood [affective] disorder: Secondary | ICD-10-CM

## 2024-04-17 DIAGNOSIS — E1142 Type 2 diabetes mellitus with diabetic polyneuropathy: Secondary | ICD-10-CM

## 2024-04-17 NOTE — Telephone Encounter (Signed)
 Copied from CRM #[id number]. Topic: Clinical - Medical Advice >> Apr 17, 2024  4:27 PM Alexandria E wrote: Reason for CRM: PJ, palliative nurse with [facility], called in requesting patient's PCP to send over a referral for a hospice evaluation. Callback number for PJ is [phone], with a fax number of [phone]. PJ stated she will also be faxing this requesting over to the office.

## 2024-04-19 NOTE — Telephone Encounter (Signed)
 Can we please call the patient to see if she is aware of this recommendation from the palliate nurse. If she is aware of this recommendations from palliative team I will review the referral request when I am at clinic.   Thank you,  Luke Shade, MD

## 2024-04-20 ENCOUNTER — Telehealth: Payer: Self-pay

## 2024-04-20 MED ORDER — GABAPENTIN 300 MG PO CAPS: 300.0000 mg | ORAL_CAPSULE | Freq: Two times a day (BID) | ORAL | 1 refills | Status: DC

## 2024-04-20 NOTE — Telephone Encounter (Signed)
 Secure chat with Sherrie Decosmo on patient care was done. She was updated on the following:  If her medical conditions are not well controlled I am afraid her life expectancy could be less than 6 months. She was also updated that I do not manage hospice orders.   Closing this encounter.   Thank you,  Luke Shade, MD

## 2024-04-20 NOTE — Telephone Encounter (Signed)
 Hospice referral made. For pain she can take Gabapentin  300 mg twice a day instead of once a day. Due to her kidney function we can't further increase dose of Gabapentin .I have sent new prescription for Gabapentin  to the pharmacy as well.   1. Mood disorder (Primary) - Ambulatory referral to Hospice  2. Panic attack - Ambulatory referral to Hospice  3. Chronic diastolic CHF (congestive heart failure) (HCC) - Ambulatory referral to Hospice  4. Bilateral carotid artery stenosis - Ambulatory referral to Hospice  5. Diabetic polyneuropathy associated with type 2 diabetes mellitus (HCC) - Ambulatory referral to Hospice  6. Lymphedema - Ambulatory referral to Hospice  7. Physical deconditioning - Ambulatory referral to Hospice  8. Mixed hyperlipidemia - Ambulatory referral to Hospice  9. Type 2 diabetes mellitus with other diabetic kidney complication (HCC) - Ambulatory referral to Hospice - Gabapentin  (NEURONTIN ) 300 MG capsule; Take 1 capsule (300 mg total) by mouth 2 (two) times daily.  Dispense: 180 capsule; Refill: 1  10. CKD (chronic kidney disease) stage 4, GFR 15-29 ml/min (HCC) - Ambulatory referral to Hospice   Thank you,  Manville Rico, MD

## 2024-04-20 NOTE — Addendum Note (Signed)
 Addended by: Proctor Carriker on: 04/20/2024 11:54 AM   Modules accepted: Orders

## 2024-04-20 NOTE — Telephone Encounter (Signed)
 Spoke with Alexis Farmer from [facility] and she says she spoke with the patient and wanted to know if Dr Abbey would be the patient's attending physical and do Dr Abbey feels the patient has 6 months or less to live. Please advise?

## 2024-04-20 NOTE — Telephone Encounter (Signed)
 Copied from CRM [id number]. Topic: Referral - Question >> Apr 20, 2024 10:32 AM Tonda B wrote: Reason for CRM: [facility] hosp[facility] has question please call back [phone]

## 2024-04-20 NOTE — Telephone Encounter (Signed)
 Spoke with patient to follow up with Dr Graylon recommendations. Patient states she has spoke with someone from [facility] in regards to the referral and she would like to move forward with referral. Patient also stated she is still in a lot of pain and was told that it could be from nerves. Patient states is there any medication for her nerve pain. Please advise?

## 2024-04-20 NOTE — Telephone Encounter (Signed)
 Noted

## 2024-04-21 NOTE — Telephone Encounter (Signed)
 Copied from CRM #[id number]. Topic: General - Other >> Apr 21, 2024  9:04 AM Vena HERO wrote: Reason for CRM: Pt called in today to return a phone call she received yesterday. She does not know who called her but she wanted to make it known that she does not want any hospital drs coming to her home, she wants to keep it separate. She wants to keep her same drs and everything as it is and she will see the hospital drs whenever she has to go to the hospital

## 2024-04-21 NOTE — Telephone Encounter (Signed)
 Noted

## 2024-04-21 NOTE — Telephone Encounter (Unsigned)
 Copied from CRM [id number]. Topic: Clinical - Home Health Verbal Orders >> Apr 21, 2024 10:08 AM Ashley R wrote: Hospice care refusal. Pt states she does not need this service.

## 2024-04-21 NOTE — Telephone Encounter (Signed)
 Appreciate patient updating us . I do not manage hospice orders, do home visits.   Luke Shade, MD

## 2024-04-27 ENCOUNTER — Telehealth: Payer: Self-pay

## 2024-04-27 NOTE — Telephone Encounter (Signed)
 Copied from CRM [id number]. Topic: Referral - Request for Referral >> Apr 27, 2024  1:46 PM Ashley R wrote: Did the patient discuss referral with their provider in the last year? No, aware of pain, but not request.  Appointment offered? Yes, appt on 11/24  Type of order/referral and detailed reason for visit: Orthopedic Doctor  Preference of office, provider, location: [place] not [place]  If referral order, have you been seen by this specialty before? Yes, years ago (If Yes, this issue or another issue? When? Where?  Can we respond through MyChart? No

## 2024-04-27 NOTE — Patient Outreach (Signed)
 RNCM returned patient call - requested RNCM phone number. States does not want Palliative Care/Hospice at this time. Was concerned could not keep seeing doctor and RNCM, explained Palliative Care would not impact providers - declining. Scheduled for followw up with RNCM.

## 2024-05-01 ENCOUNTER — Ambulatory Visit: Admitting: Podiatry

## 2024-05-01 ENCOUNTER — Encounter: Payer: Self-pay | Admitting: Podiatry

## 2024-05-01 VITALS — Ht 63.0 in | Wt 200.0 lb

## 2024-05-01 DIAGNOSIS — E1142 Type 2 diabetes mellitus with diabetic polyneuropathy: Secondary | ICD-10-CM

## 2024-05-01 DIAGNOSIS — M79675 Pain in left toe(s): Secondary | ICD-10-CM | POA: Diagnosis not present

## 2024-05-01 DIAGNOSIS — B351 Tinea unguium: Secondary | ICD-10-CM

## 2024-05-01 DIAGNOSIS — M2141 Flat foot [pes planus] (acquired), right foot: Secondary | ICD-10-CM

## 2024-05-01 DIAGNOSIS — I739 Peripheral vascular disease, unspecified: Secondary | ICD-10-CM | POA: Diagnosis not present

## 2024-05-01 DIAGNOSIS — M2142 Flat foot [pes planus] (acquired), left foot: Secondary | ICD-10-CM | POA: Diagnosis not present

## 2024-05-01 DIAGNOSIS — M79674 Pain in right toe(s): Secondary | ICD-10-CM

## 2024-05-04 NOTE — Telephone Encounter (Signed)
 To be discussed during upcoming appointment.   Luke Shade, MD

## 2024-05-06 NOTE — Telephone Encounter (Signed)
 open in error

## 2024-05-08 ENCOUNTER — Encounter

## 2024-05-10 ENCOUNTER — Encounter: Payer: Self-pay | Admitting: Podiatry

## 2024-05-10 NOTE — Progress Notes (Signed)
 Subjective:  Patient ID: Alexis Farmer, female    DOB: 1941-06-06,  MRN: [id number]  Alexis Farmer presents to clinic today for at risk footcare. Patient has h/o diabetes, neuropathy and PAD and is seen for  and painful elongated mycotic toenails 1-5 bilaterally which are tender when wearing enclosed shoe gear. Pain is relieved with periodic professional debridement. Patient is requesting diabetic shoes on today's visit. Chief Complaint  Patient presents with   Nail Problem    Pt is here for Alexis Farmer, unsure of last A1C PCP is Dr Abbey an LOV was in August.   New problem(s): None.   PCP is Bair, Kalpana, MD.  Allergies  Allergen Reactions   Celexa  [Citalopram ]     Diarrhea upset stomach    Jardiance  [Empagliflozin ] Other (See Comments)    Reaction not recalled   Norvasc  [Amlodipine ]     Leg edema   Tape Other (See Comments)    Leaves the skin raw if left on for a period of time- tolerates paper tape   Penicillin V Rash   Penicillin V Potassium Rash   Penicillins Rash    Has patient had a PCN reaction causing immediate rash, facial/tongue/throat swelling, SOB or lightheadedness with hypotension: Yes Has patient had a PCN reaction causing severe rash involving mucus membranes or skin necrosis: No Has patient had a PCN reaction that required hospitalization No Has patient had a PCN reaction occurring within the last 10 years: Yes If all of the above answers are NO, then may proceed with Cephalosporin use.     Review of Systems: Negative except as noted in the HPI.  Objective: No changes noted in today's physical examination. There were no vitals filed for this visit. Shakira Goldberger is a pleasant 83 y.o. female in NAD. AAO x 3.  Vascular Examination: CFT <4 seconds b/l. DP pulses diminished b/l. PT pulses diminished b/l. Digital hair absent. Skin temperature gradient warm to warm b/l. No ischemia or gangrene. No cyanosis or clubbing noted b/l. +2 pitting edema BLE.    Neurological Examination: Sensation grossly intact b/l with 10 gram monofilament. Vibratory sensation diminished b/l.  Dermatological Examination: Pedal skin thin, shiny and atrophic b/l. No open wounds. No interdigital macerations.   Toenails 1-5 b/l thick, discolored, elongated with subungual debris and pain on dorsal palpation.   No hyperkeratotic nor porokeratotic lesions present on today's visit.  Musculoskeletal Examination: Muscle strength 4/5 to all lower extremity muscle groups bilaterally. Pes planus deformity noted bilateral LE. Utilizes wheelchair for mobility assistance.  Radiographs: None  Assessment/Plan: 1. Pain due to onychomycosis of toenails of both feet   2. PAD (peripheral artery disease)   3. Pes planus of both feet   4. Diabetic peripheral neuropathy associated with type 2 diabetes mellitus (HCC)    Orders Placed This Encounter  Procedures   For Home Use Only DME Diabetic Shoe    To [facility]: Dispense one pair extra depth shoes and 3 pair total contact insoles.  -Patient was evaluated today. All questions/concerns addressed on today's visit. -Continue foot and shoe inspections daily. Monitor blood glucose per PCP/Endocrinologist's recommendations. -Patient to continue soft, supportive shoe gear daily. -DME Rx written for [facility] for one pair extra depth shoes and 3 pair total contact insoles. To [facility], [address], [place], [place]. Phone: [phone]. Fax: [phone]. -Mycotic toenails 1-5 bilaterally were debrided in length and girth with sterile nail nippers and dremel without incident. -Patient/POA to call  should there be question/concern in the interim.   Return in about 3 months (around 08/01/2024).  Alexis Farmer, DPM      Alexis Farmer LOCATION: [address] [place] [address]                    Office [phone]   Alexis Farmer LOCATION: [address] [place], [place] [address] Office [phone]

## 2024-05-11 ENCOUNTER — Ambulatory Visit

## 2024-05-11 VITALS — BP 120/82 | HR 72 | Temp 98.2°F

## 2024-05-11 DIAGNOSIS — B351 Tinea unguium: Secondary | ICD-10-CM | POA: Insufficient documentation

## 2024-05-11 DIAGNOSIS — I890 Lymphedema, not elsewhere classified: Secondary | ICD-10-CM

## 2024-05-11 DIAGNOSIS — G894 Chronic pain syndrome: Secondary | ICD-10-CM

## 2024-05-11 DIAGNOSIS — M25551 Pain in right hip: Secondary | ICD-10-CM | POA: Diagnosis not present

## 2024-05-11 DIAGNOSIS — E1129 Type 2 diabetes mellitus with other diabetic kidney complication: Secondary | ICD-10-CM

## 2024-05-11 DIAGNOSIS — E1142 Type 2 diabetes mellitus with diabetic polyneuropathy: Secondary | ICD-10-CM

## 2024-05-11 DIAGNOSIS — N184 Chronic kidney disease, stage 4 (severe): Secondary | ICD-10-CM

## 2024-05-11 DIAGNOSIS — E66813 Obesity, class 3: Secondary | ICD-10-CM

## 2024-05-11 DIAGNOSIS — Z794 Long term (current) use of insulin: Secondary | ICD-10-CM

## 2024-05-11 DIAGNOSIS — F331 Major depressive disorder, recurrent, moderate: Secondary | ICD-10-CM

## 2024-05-11 DIAGNOSIS — E1169 Type 2 diabetes mellitus with other specified complication: Secondary | ICD-10-CM

## 2024-05-11 DIAGNOSIS — I739 Peripheral vascular disease, unspecified: Secondary | ICD-10-CM | POA: Diagnosis not present

## 2024-05-11 DIAGNOSIS — M79674 Pain in right toe(s): Secondary | ICD-10-CM

## 2024-05-11 DIAGNOSIS — M79675 Pain in left toe(s): Secondary | ICD-10-CM

## 2024-05-11 MED ORDER — DICLOFENAC SODIUM 1 % EX GEL: 4.0000 g | Freq: Four times a day (QID) | CUTANEOUS | 4 refills | Status: DC | PRN

## 2024-05-11 MED ORDER — DULOXETINE HCL 30 MG PO CPEP: 30.0000 mg | ORAL_CAPSULE | Freq: Every day | ORAL | 1 refills | Status: DC

## 2024-05-11 MED ORDER — GABAPENTIN 300 MG PO CAPS: 300.0000 mg | ORAL_CAPSULE | Freq: Every day | ORAL | 1 refills | Status: AC

## 2024-05-11 MED ORDER — INSULIN SYRINGE-NEEDLE U-100 30G X 1/2" 0.5 ML MISC: 12 refills | Status: AC

## 2024-05-11 NOTE — Assessment & Plan Note (Signed)
 Symptoms of mood disturbances, including anger and crying associated with pain right hip. Duloxetine  considered for mood stabilization and pain management. Started duloxetine  30 mg once daily for mood stabilization and pain management.

## 2024-05-11 NOTE — Assessment & Plan Note (Signed)
 Pair of therapeutic footwear with (one pair) with 3 pairs of off the shelf multi layers moldable foot orthotics prescription will be faxed to [facility] at [place]. Continue follow up with podiatry.  I encouraged patient to follow up with vascular surgery, discussed importance of blood glucose management and regular follow up with endocrinologist was discussed today.

## 2024-05-11 NOTE — Assessment & Plan Note (Signed)
 Type 2 diabetes with hyperglycemia, peripheral vascular disease, neuropathy, CKD  managed with insulin  by endocrinologist at [facility]. Ensure follow-up with diabetes specialist in January. Sent refill for insulin  needles to pharmacy. Continue Gabapentin  300 mg at nighttime for peripheral neuropathy.

## 2024-05-11 NOTE — Patient Instructions (Addendum)
 Start Duloxetine  30 mg daily to help with pain in legs, this will also help with mood as well as with pain.   Continue Gabapentin  300 mg at nighttime only.    Use diclofenac  gel up to 4 times a day as needed on the right hip, right knee for pain. I have sent a prescription to the pharmacy.   Follow up in 2 months with Dr. Abbey.    I have put a referral to orthopedic department to evaluate into the right hip pain. If you do not hear from orthopedics in 2 weeks please reach out to our clinic.

## 2024-05-11 NOTE — Assessment & Plan Note (Signed)
 Recommend referral to pain clinic, patient declined.  Continue Gabapentin  300 mg daily. Add duloxetine  30 mg to help with chronic pain and help stabilize mood related to pain. Recommend f/u in 2 months or sooner if she needs.

## 2024-05-11 NOTE — Assessment & Plan Note (Signed)
 Recommend increasing activity as tolerated and safe.

## 2024-05-11 NOTE — Assessment & Plan Note (Signed)
 Chronic, radiating down right inner thigh.  Orthopedic evaluation needed for potential steroid injection to the right hip. Referral made today.  Prescribed Voltaren  gel for topical pain relief, apply up to four times a day as needed.

## 2024-05-11 NOTE — Assessment & Plan Note (Signed)
 Elevate legs, does not like to wear compression stocking, is not following up with vascular surgery as recommended during her last office visit. Encouraged medication adherence and lifestyle modifications. Referral to ortho to help with right hip pain, if hip pain controlled she may be able to ambulate more and help with weight loss as well.

## 2024-05-11 NOTE — Assessment & Plan Note (Signed)
 Limits medication options. Continue f/u with Dr. Dennise

## 2024-05-11 NOTE — Assessment & Plan Note (Signed)
 Pair of therapeutic footwear with (one pair) with 3 pairs of off the shelf multi layers moldable foot orthotics prescription will be faxed to [facility] at [place]. I encouraged patient to follow up with vascular surgery, discussed importance of blood glucose management and regular follow up with endocrinologist was discussed today.

## 2024-05-11 NOTE — Progress Notes (Addendum)
 Established Patient Office Visit    Subjective  Patient ID: Alexis Farmer, female    DOB: Jun 09, 1941  Age: 83 y.o. MRN: [id number]  Chief Complaint  Patient presents with   Hypertension    She  has a past medical history of Acute on chronic heart failure with preserved ejection fraction (HFpEF) (HCC) (04/01/2023), Anxiety (09/13/2015), Arthritis, Atypical chest pain (01/15/2021), [place] (10/24/2021), Bradycardia (07/17/2016), Cervical spondylosis with radiculopathy (01/26/2017), Chickenpox, Chronic kidney disease, stage 2 (mild) (04/08/2014), Constipation (11/17/2019), Cough (01/25/2016), Diabetes mellitus without complication (HCC), Disorder of rotator cuff (06/24/2018), Diverticulitis, Dysuria (03/22/2017), Edema of foot (04/08/2014), Elevated troponin (12/15/2020), Essential hypertension (04/08/2014), Fall (03/26/2018), Gastroenteritis (08/12/2021), Gastrointestinal hemorrhage (08/03/2015), GI bleed, High cholesterol, History of blood transfusion, Hyperglycemia due to type 2 diabetes mellitus (HCC) (03/11/2015), Hypertension, Hypertensive urgency (12/15/2020), Low back pain (04/08/2014), Morbid obesity (HCC) (04/08/2014), Palpitations (12/15/2020), Peripheral edema (12/22/2019), Postmenopausal bleeding (07/17/2016), Primary hypertension (03/25/2020), Pure hypercholesterolemia (08/10/2020), Radiculopathy due to cervical spondylosis (01/26/2017), Recurrent falls (08/13/2019), Renal insufficiency, Skin cyst (10/24/2021), Stage 3a chronic kidney disease (HCC) (01/15/2021), Swelling of lower leg (07/19/2016), and Vertigo (10/13/2015).  HPI Discussed the use of AI scribe software for clinical note transcription with the patient, who gave verbal consent to proceed.  History of Present Illness Alexis Farmer is an 83 year old female with chronic pain, morbid obesity, CKD, chronic bilateral lower leg edema, right hip pain presents for evaluation of following concerns: - Polyneuropathy of b/l  lower feet, lower legs, peripheral artery disease, onychomycosis of b/l toenails. She continues to follow up with podiatry. Patient wears protective footwear to help reduce pain and provide stability. She is requesting we send her prescription for the following:  one pair extra depth shoes and 3 pair total contact insoles.   - Chronic right hip pain, chronic pain syndrome:  R hip pain radiating to right lower leg. She has CKD so medication management is complicated. She currently takes Gabapentin  300 mg daily and does not believe it is helping with her pain.   Pain interferes with sleep and makes her mood worse.   She does not want to see a pain specialist but is interested in seeing orthopedic surgeon. She uses a walker for ambulation.     ROS As per HPI    Objective:     BP 120/82 (BP Location: Right Arm, Patient Position: Sitting, Cuff Size: Normal)   Pulse 72   Temp 98.2 F (36.8 C) (Oral)   SpO2 95%      05/11/2024    1:19 PM 03/23/2024    3:08 PM 03/18/2024    1:38 PM  Depression screen PHQ 2/9  Decreased Interest 3 3 0  Down, Depressed, Hopeless 3 2 0  PHQ - 2 Score 6 5 0  Altered sleeping 0 3 0  Tired, decreased energy 3 3 0  Change in appetite 0 2 0  Feeling bad or failure about yourself  0 0 0  Trouble concentrating 3 1 0  Moving slowly or fidgety/restless 0 0 0  Suicidal thoughts 0 0 0  PHQ-9 Score 12 14  0   Difficult doing work/chores Very difficult Somewhat difficult Not difficult at all     Data saved with a previous flowsheet row definition      05/11/2024    1:19 PM 03/18/2024    1:38 PM 02/06/2024    1:12 PM 01/06/2024   11:34 AM  GAD 7 : Generalized Anxiety Score  Nervous, Anxious, on Edge 0 0 0 3  Control/stop worrying  3 0 0 3  Worry too much - different things 3 0 3 3  Trouble relaxing 0 0 0 3  Restless 0 0 0 0  Easily annoyed or irritable 3 0 0 3  Afraid - awful might happen 0 0 0 3  Total GAD 7 Score 9 0 3 18  Anxiety Difficulty  Not  difficult at all Not difficult at all Somewhat difficult      05/11/2024    1:19 PM 03/23/2024    3:08 PM 03/18/2024    1:38 PM  Depression screen PHQ 2/9  Decreased Interest 3 3 0  Down, Depressed, Hopeless 3 2 0  PHQ - 2 Score 6 5 0  Altered sleeping 0 3 0  Tired, decreased energy 3 3 0  Change in appetite 0 2 0  Feeling bad or failure about yourself  0 0 0  Trouble concentrating 3 1 0  Moving slowly or fidgety/restless 0 0 0  Suicidal thoughts 0 0 0  PHQ-9 Score 12 14  0   Difficult doing work/chores Very difficult Somewhat difficult Not difficult at all     Data saved with a previous flowsheet row definition      05/11/2024    1:19 PM 03/18/2024    1:38 PM 02/06/2024    1:12 PM 01/06/2024   11:34 AM  GAD 7 : Generalized Anxiety Score  Nervous, Anxious, on Edge 0 0 0 3  Control/stop worrying 3 0 0 3  Worry too much - different things 3 0 3 3  Trouble relaxing 0 0 0 3  Restless 0 0 0 0  Easily annoyed or irritable 3 0 0 3  Afraid - awful might happen 0 0 0 3  Total GAD 7 Score 9 0 3 18  Anxiety Difficulty  Not difficult at all Not difficult at all Somewhat difficult   SDOH Screenings   Food Insecurity: No Food Insecurity (02/12/2024)  Housing: Low Risk  (02/12/2024)  Transportation Needs: No Transportation Needs (02/12/2024)  Utilities: Not At Risk (02/12/2024)  Alcohol Screen: Low Risk  (11/29/2022)  Depression (PHQ2-9): High Risk (05/11/2024)  Financial Resource Strain: Medium Risk (10/03/2023)  Physical Activity: Inactive (10/03/2023)  Social Connections: Moderately Integrated (10/21/2023)  Stress: Stress Concern Present (11/29/2022)  Tobacco Use: Low Risk  (05/11/2024)     Physical Exam Constitutional:      General: She is not in acute distress.    Appearance: She is obese.  HENT:     Head: Normocephalic and atraumatic.     Right Ear: Tympanic membrane normal.     Left Ear: Tympanic membrane normal.     Nose: No congestion or rhinorrhea.     Mouth/Throat:      Mouth: Mucous membranes are moist.  Cardiovascular:     Rate and Rhythm: Normal rate.  Pulmonary:     Effort: Pulmonary effort is normal.     Breath sounds: No wheezing.  Abdominal:     Palpations: Abdomen is soft.     Tenderness: There is no abdominal tenderness. There is no guarding.  Musculoskeletal:     Cervical back: Neck supple.     Right hip: Tenderness (on palpation over right hip) present. Normal strength.     Left hip: No tenderness.     Right lower leg: Edema present.     Left lower leg: Edema present.     Right foot: No prominent metatarsal heads.     Left foot: No prominent metatarsal heads.  Comments: B/L lower leg edema 2+ On wheel chair  B// foot exam:  Patient has diminished b/l posterior tibial pulse and dorsalis pedis. Normal nonfilament test bilaterally.  She has thick toe nails bilaterally.   Skin:    Findings: No lesion.  Neurological:     Mental Status: She is oriented to person, place, and time.     Comments: Patient is on a wheel chair, uses a walker for ambulation at home. Gait not assessed.   Psychiatric:        Mood and Affect: Mood normal.      No results found for any visits on 05/11/24.  The ASCVD Risk score (Arnett DK, et al., 2019) failed to calculate for the following reasons:   The 2019 ASCVD risk score is only valid for ages 68 to 6   Risk score cannot be calculated because patient has a medical history suggesting prior/existing ASCVD     Assessment & Plan:   Right hip pain Assessment & Plan: Chronic, radiating down right inner thigh.  Orthopedic evaluation needed for potential steroid injection to the right hip. Referral made today.  Prescribed Voltaren  gel for topical pain relief, apply up to four times a day as needed.  Orders: -     Ambulatory referral to Orthopedic Surgery -     Diclofenac  Sodium; Apply 4 g topically 4 (four) times daily as needed.  Dispense: 350 g; Refill: 4  PAD (peripheral artery disease) Assessment  & Plan: Pair of therapeutic footwear with (one pair) with 3 pairs of off the shelf multi layers moldable foot orthotics prescription will be faxed to [facility] at [place]. I encouraged patient to follow up with vascular surgery, discussed importance of blood glucose management and regular follow up with endocrinologist was discussed today.     Type 2 diabetes mellitus with other diabetic kidney complication [facility]) Assessment & Plan: Type 2 diabetes with hyperglycemia, peripheral vascular disease, neuropathy, CKD  managed with insulin  by endocrinologist at [facility]. Ensure follow-up with diabetes specialist in January. Sent refill for insulin  needles to pharmacy. Continue Gabapentin  300 mg at nighttime for peripheral neuropathy.   Orders: -     Gabapentin ; Take 1 capsule (300 mg total) by mouth at bedtime.  Dispense: 90 capsule; Refill: 1 -     Insulin  Syringe-Needle U-100; Use it to give insulin  or check blood glucose, 4 times a day or as covered by the insurance  Dispense: 500 each; Refill: 12 -     DULoxetine  HCl; Take 1 capsule (30 mg total) by mouth daily.  Dispense: 90 capsule; Refill: 1  Diabetic polyneuropathy associated with type 2 diabetes mellitus (HCC) Assessment & Plan: Plan per DM with other complications    Chronic pain syndrome Assessment & Plan: Recommend referral to pain clinic, patient declined.  Continue Gabapentin  300 mg daily. Add duloxetine  30 mg to help with chronic pain and help stabilize mood related to pain. Recommend f/u in 2 months or sooner if she needs.    Pain due to onychomycosis of toenails of both feet Assessment & Plan: Pair of therapeutic footwear with (one pair) with 3 pairs of off the shelf multi layers moldable foot orthotics prescription will be faxed to [facility] at [place]. Continue follow up with podiatry.  I encouraged patient to follow up with vascular surgery, discussed importance of blood glucose management and  regular follow up with endocrinologist was discussed today.    Moderate episode of recurrent major depressive disorder [facility]) Assessment &  Plan: Symptoms of mood disturbances, including anger and crying associated with pain right hip. Duloxetine  considered for mood stabilization and pain management. Started duloxetine  30 mg once daily for mood stabilization and pain management.   CKD (chronic kidney disease) stage 4, GFR 15-29 ml/min (HCC) Assessment & Plan: Limits medication options. Continue f/u with Dr. Dennise    Obesity, Class III, BMI 40-49.9 (morbid obesity) (HCC) Assessment & Plan: Recommend increasing activity as tolerated and safe.    Lymphedema Assessment & Plan: Elevate legs, does not like to wear compression stocking, is not following up with vascular surgery as recommended during her last office visit. Encouraged medication adherence and lifestyle modifications. Referral to ortho to help with right hip pain, if hip pain controlled she may be able to ambulate more and help with weight loss as well.    Type 2 diabetes mellitus with other specified complication, with long-term current use of insulin  [facility]) Assessment & Plan: Type 2 diabetes with hyperglycemia, peripheral vascular disease, neuropathy, CKD  managed with insulin  by endocrinologist at [facility]. Ensure follow-up with diabetes specialist in January. Sent refill for insulin  needles to pharmacy. Continue Gabapentin  300 mg at nighttime for peripheral neuropathy.      Return in about 2 months (around 07/11/2024) for Chronic follow up .   Luke Shade, MD

## 2024-05-11 NOTE — Assessment & Plan Note (Signed)
 Plan per DM with other complications

## 2024-05-18 ENCOUNTER — Telehealth: Payer: Self-pay

## 2024-05-18 NOTE — Telephone Encounter (Signed)
 If patient does not want to take medication as recommended, that is her choice. She does not need appointment sooner. It just means her pain may not be adequately controlled.   Luke Shade, MD

## 2024-05-18 NOTE — Telephone Encounter (Signed)
 Copied from CRM #[id number]. Topic: Clinical - Medical Advice >> May 18, 2024 11:35 AM Hadassah PARAS wrote: Reason for CRM: Pt was prescribed DULoxetine  (CYMBALTA ) 30 MG capsule and is refusing to take the medication. Pt has done her research and stated the medication will do her more harm than good. She is scheduled to be seen in Feb. Due to this change, should pt be seen earlier? Next avail is not until March. Please advise pt [phone]

## 2024-05-18 NOTE — Telephone Encounter (Signed)
 Copied from CRM #[id number]. Topic: Clinical - Medical Advice >> May 18, 2024  2:47 PM Vena HERO wrote: Reason for CRM: Deitra from [facility] called in to make sure we received a fax for diabetic shoes. Verified we have it and requested her call back and fax [phone] is call back number [phone] is fax number Requesting Dr Graylon signature on the left side of page for physician approval

## 2024-05-18 NOTE — Telephone Encounter (Signed)
 Do you mind printing the order again please.   Luke Shade, MD

## 2024-05-18 NOTE — Addendum Note (Signed)
 Addended by: Analia Zuk on: 05/18/2024 05:23 PM   Modules accepted: Orders

## 2024-05-19 ENCOUNTER — Other Ambulatory Visit: Payer: Self-pay

## 2024-05-19 NOTE — Patient Instructions (Signed)
 Visit Information  Thank you for taking time to visit with me today. Please don't hesitate to contact me if I can be of assistance to you before our next scheduled appointment.  Your next care management appointment is by telephone on 06/16/2024 at 3:00 pm  Telephone follow-up in 1 month  Please call the care guide team at [phone] if you need to cancel, schedule, or reschedule an appointment.   Please call the Suicide and Crisis Lifeline: 988 call the [place]  National Suicide Prevention Lifeline: 289-324-3799 or TTY: 989-425-1436 TTY 574-755-3716) to talk to a trained counselor call 1-800-273-TALK (toll free, 24 hour hotline) go to [facility] [address] [place] [phone]) call 911 if you are experiencing a Mental Health or Behavioral Health Crisis or need someone to talk to.  Nestora Duos, MSN, RN [facility], [facility] RN Care Manager Direct Dial: [phone] Fax: [phone]

## 2024-05-19 NOTE — Telephone Encounter (Signed)
 Return call to patient to follow up with telephone encounter. Patient states she looked up the medication and was informed that it was not related to pain. I informed patient that the medication was prescribed to help treat her pain and discomfort. Patient states she is not refusing to take the medication, but she doesn't want to take something that may make things worse of what she is already experiencing. Patient states she will keep her scheduled appointment. Verbalized understanding and has no further questions at this time.

## 2024-05-19 NOTE — Patient Outreach (Signed)
 Complex Care Management   Visit Note  05/19/2024  Name:  Alexis Farmer MRN: [id number] DOB: 26-Mar-1941  Situation: Referral received for Complex Care Management related to Heart Failure and Diabetes with Complications I obtained verbal consent from Patient.  Visit completed with Patient  on the phone  Background:   Past Medical History:  Diagnosis Date   Acute on chronic heart failure with preserved ejection fraction (HFpEF) (HCC) 04/01/2023   Anxiety 09/13/2015   Arthritis    Atypical chest pain 01/15/2021   Blackhead 10/24/2021   Bradycardia 07/17/2016   Formatting of this note might be different from the original.  Last Assessment & Plan:   Found to be bradycardic at outside hospital. They stopped metoprolol  and verapamil  and heart rate has been normal since then. Echo appears to have been reassuring at the outside facility. Appears to be doing much better with regards to this. She'll continue to monitor for recurrent symptoms. 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  Last Assessment & Plan:   Continues to have issues with this.  She is following with a specialist and it sounds as though they are planning on an MRI.  Benign exam today.   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 Dennise Deal of this note might be different from the original.  Dr. Dennise      Last Assessment & Plan:   Recheck kidney function today.   Constipation 11/17/2019   Cough 01/25/2016   Formatting of this note might be different from the original.  Last Assessment & Plan:   Minimal nighttime cough. Improves when she washes her pillows consistently. Suspect allergies contributing. She'll monitor.   Diabetes mellitus without complication (HCC)    one elevated reading/ no treatment   Disorder of rotator cuff 06/24/2018   Diverticulitis    Dysuria 03/22/2017   Formatting of this  note might be different from the original.  Last Assessment & Plan:   Symptoms concerning for UTI.  Will check urinalysis.   Edema of foot 04/08/2014   Formatting of this note might be different from the original.  Last Assessment & Plan:   Chronic pedal edema. Suspect venous insufficiency. No orthopnea or shortness of breath. Advised elevation of her legs. Consider compression stockings in the future.   Elevated troponin 12/15/2020   Essential hypertension 04/08/2014   Formatting of this note might be different from the original.  Last Assessment & Plan:   Well-controlled on recheck.  Continue current regimen.   Fall 03/26/2018   Gastroenteritis 08/12/2021   Gastrointestinal hemorrhage 08/03/2015   GI bleed    High cholesterol    History of blood transfusion    Hyperglycemia due to type 2 diabetes mellitus (HCC) 03/11/2015   Hypertension    Hypertensive urgency 12/15/2020   Low back pain 04/08/2014   Morbid obesity (HCC) 04/08/2014   Formatting of this note might be different from the original.  Last Assessment & Plan:   Weight is stable. Discussed diet and exercise at length. Encouraged whatever exercise she can do. Given diet information.   Palpitations 12/15/2020   Peripheral edema 12/22/2019   Postmenopausal bleeding 07/17/2016   Formatting of this note might be different from the original.  Last Assessment & Plan:   Recent D&C. Following with gynecology. Benign findings. Monitor for recurrence.   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  Last Assessment & Plan:   Continues to have issues with this.  She is following with a specialist and it sounds as though they are planning on an MRI.  Benign exam today.   Recurrent falls 08/13/2019   Renal insufficiency    Skin cyst 10/24/2021   Stage 3a chronic kidney disease (HCC) 01/15/2021    Swelling of lower leg 07/19/2016   Vertigo 10/13/2015   Formatting of this note might be different from the original.  Last Assessment & Plan:   No recurrence. Discussed that meclizine  is an as needed medication and that she does not need to take this daily. She will continue to monitor.    Assessment: Patient Reported Symptoms:  Cognitive Cognitive Status: Alert and oriented to person, place, and time, Insightful and able to interpret abstract concepts, Normal speech and language skills, Struggling with memory recall Cognitive/Intellectual Conditions Management [RPT]: None reported or documented in medical history or problem list   Health Maintenance Behaviors: Annual physical exam  Neurological Neurological Review of Symptoms: Numbness Neurological Management Strategies: Medication therapy, Routine screening Neurological Comment: ortho referral for Right hip and leg pain - has not received call, RNCM sending message to PCP office, awaiting ortho shoes, not using Voltaren  - not covered by insurance, not using Duloxetine  due to concern about side effects  HEENT HEENT Symptoms Reported: No symptoms reported HEENT Management Strategies: Routine screening    Cardiovascular Cardiovascular Symptoms Reported: Swelling in legs or feet Does patient have uncontrolled Hypertension?: No Cardiovascular Management Strategies: Medication therapy, Routine screening Cardiovascular Comment: Denies HF sx, last BP was 109/77 er patient  Respiratory Respiratory Symptoms Reported: No symptoms reported Respiratory Management Strategies: Routine screening, Medication therapy  Endocrine Endocrine Symptoms Reported: No symptoms reported Is patient diabetic?: Yes List most recent blood sugar readings, include date and time of day: FBG today 223, reports ranging from 167-300 (about a month ago, denies lows or symptoms when high, continues snack before bed,    Gastrointestinal Gastrointestinal Symptoms Reported:  Constipation Additional Gastrointestinal Details: senakot for constipation Gastrointestinal Management Strategies: Medication therapy Gastrointestinal Comment: sttes family cooking more often for her, appetite poor at times, discussed need to eat when taking insulin     Genitourinary Genitourinary Symptoms Reported: Incontinence Additional Genitourinary Details: no UTI sx Genitourinary Management Strategies: Incontinence garment/pad  Integumentary Integumentary Symptoms Reported: Rash Additional Integumentary Details: RNCM requesting refill for Nystatin  for rash under breasts and stomach Skin Management Strategies: Routine screening  Musculoskeletal Musculoskelatal Symptoms Reviewed: Back pain, Joint pain, Limited mobility, Muscle pain, Weakness, Unsteady gait Additional Musculoskeletal Details: sending request to PCP to resend Ortho referral, no call received, Musculoskeletal Management Strategies: Routine screening Falls in the past year?: Yes Number of falls in past year: 2 or more Was there an injury with Fall?: No Fall Risk Category Calculator: 2 Patient Fall Risk Level: Moderate Fall Risk Patient at Risk for Falls Due to: History of fall(s), Impaired mobility, Impaired balance/gait Fall risk Follow up: Falls evaluation completed, Falls prevention discussed  Psychosocial Psychosocial Symptoms Reported: Sadness - if selected complete PHQ 2-9, Depression - if selected complete PHQ 2-9 Additional Psychological Details: very upset at start of call, tearful at times, denied wanting to kill/harm self, allowed to vent, share feelings and support provided, reportd feeling better by end of call,  patient continues to decline LCSW/Counseling?medications, had initially agreed to Palliative Care, they  advised Hospice and she declined all services, Behavioral Management Strategies: Support system Major Change/Loss/Stressor/Fears (CP): Medical condition, self, Environment Techniques to [employer] with  Loss/Stress/Change: Spiritual practice(s), Diversional activities      05/19/2024    PHQ2-9 Depression Screening   Little interest or pleasure in doing things Nearly every day  Feeling down, depressed, or hopeless Nearly every day  PHQ-2 - Total Score 6  Trouble falling or staying asleep, or sleeping too much Several days  Feeling tired or having little energy Nearly every day  Poor appetite or overeating  Several days  Feeling bad about yourself - or that you are a failure or have let yourself or your family down Not at all  Trouble concentrating on things, such as reading the newspaper or watching television Nearly every day  Moving or speaking so slowly that other people could have noticed.  Or the opposite - being so fidgety or restless that you have been moving around a lot more than usual Not at all  Thoughts that you would be better off dead, or hurting yourself in some way Not at all  PHQ2-9 Total Score 14  If you checked off any problems, how difficult have these problems made it for you to do your work, take care of things at home, or get along with other people Very difficult  Depression Interventions/Treatment Patient refuses Treatment    There were no vitals filed for this visit.    Medications Reviewed Today     Reviewed by Devra Lands, RN (Registered Nurse) on 05/19/24 at 1512  Med List Status: <None>   Medication Order Taking? Sig Documenting Provider Last Dose Status Informant  acetaminophen  (TYLENOL ) 500 MG tablet [id number] Yes Take 500 mg by mouth every 6 (six) hours as needed for mild pain or headache. [provider]  Active Nursing Home Medication Administration Guide (MAG), Pharmacy Records  Ascorbic Acid (VITAMIN C PO) [id number] Yes Take 1 tablet by mouth daily. [provider]  Active Nursing Home Medication Administration Guide (MAG), Pharmacy Records  aspirin  EC 81 MG tablet [id number] Yes Take 81 mg by mouth daily. Swallow whole.  [provider]  Active Nursing Home Medication Administration Guide [facility]), Pharmacy Records           Med Note LYELL, REENA KANDICE Debar Jul 10, 2022 10:42 AM)    blood glucose meter kit and supplies KIT [id number] Yes Accu chek, Dx code E11.65, check 3 times daily Maribeth Camellia KANDICE, MD  Active Nursing Home Medication Administration Guide (MAG), Pharmacy Records  Blood Pressure KIT [id number] Yes 1 Device by Does not apply route daily. McLean-Scocuzza, Randine SAILOR, MD  Active Nursing Home Medication Administration Guide (MAG), Pharmacy Records  carvedilol  (COREG ) 6.25 MG tablet [id number] Yes Take 1 tablet (6.25 mg total) by mouth 2 (two) times daily with a meal. Bair, Kalpana, MD  Active   clopidogrel  (PLAVIX ) 75 MG tablet [id number] Yes Take 1 tablet (75 mg total) by mouth daily. Loistine Sober, NP  Active   diclofenac  Sodium (VOLTAREN ) 1 % GEL [id number]  Apply 4 g topically 4 (four) times daily as needed.  Patient not taking: Reported on 05/19/2024   Bair, Kalpana, MD  Active   fluticasone  (FLONASE ) 50 MCG/ACT nasal spray [id number]  Place 1 spray into both nostrils daily.  Patient not taking: Reported on 05/19/2024   Bair, Kalpana, MD  Active   gabapentin  (NEURONTIN ) 300 MG capsule [id number] Yes Take 1 capsule (300 mg total) by mouth at bedtime.  Bair, Kalpana, MD  Active   glucose blood (ACCU-CHEK GUIDE TEST) test strip [id number] Yes Use as instructed Bair, Kalpana, MD  Active   hydrALAZINE  (APRESOLINE ) 100 MG tablet [id number] Yes TAKE 1 TABLET BY MOUTH 4 TIMES DAILY. MUST KEEP UPCOMING APPOINTMENT ON 10/21/23 FOR FUTURE REFILLS. Dorinda Drue DASEN, MD  Active   insulin  NPH Human (NOVOLIN N) 100 UNIT/ML injection [id number] Yes Inject 15-20 Units into the skin 2 (two) times daily before a meal. Inject 20 units in the morning an 15 units in the evening. [provider]  Active Nursing Home Medication Administration Guide (MAG), Pharmacy Records  insulin  regular (NOVOLIN R) 100 units/mL  injection [id number] Yes Inject 0-0.08 mLs into the skin. [provider]  Active   Insulin  Syringe-Needle U-100 30G X 1/2 0.5 ML MISC [id number] Yes Use it to give insulin  or check blood glucose, 4 times a day or as covered by the insurance Bair, Luke, MD  Active   ipratropium-albuterol  (DUONEB) 0.5-2.5 (3) MG/3ML SOLN [id number]  Take 3 mLs by nebulization 4 (four) times daily. Take scheduled 4 times daily until wheezing and shortness of breath from RSV improves. Then take as needed.  Patient not taking: Reported on 05/19/2024   [provider]  Active Pharmacy Records, Nursing Home Medication Administration Guide (MAG)           Med Note BEVERLEE, MERLYNN   Sun Sep 29, 2023 10:37 PM) Pt states worsens symptoms  lidocaine  (LIDODERM ) 5 % [id number]  Place 1 patch onto the skin daily. Remove & Discard patch within 12 hours or as directed by MD  Patient not taking: Reported on 05/19/2024   Bair, Kalpana, MD  Active   Multiple Vitamins-Minerals (CENTRUM SILVER 50+WOMEN) TABS [id number] Yes Take 1 tablet by mouth daily with breakfast. [provider]  Active Nursing Home Medication Administration Guide (MAG), Pharmacy Records  nystatin  (MYCOSTATIN /NYSTOP ) powder [id number] Yes Apply 1 Application topically 2 (two) times daily as needed. Narendra, Nischal, MD  Active   rosuvastatin  (CRESTOR ) 20 MG tablet [id number] Yes TAKE 1 TABLET BY MOUTH EVERY DAY Bair, Kalpana, MD  Active   senna-docusate (SENOKOT-S) 8.6-50 MG tablet [id number] Yes Take 1 tablet by mouth 2 (two) times daily. Narendra, Nischal, MD  Active   spironolactone  (ALDACTONE ) 25 MG tablet [id number] Yes Take 25 mg by mouth 3 (three) times a week. Monday Wednesday and friday [provider]  Active Nursing Home Medication Administration Guide [facility]), Pharmacy Records           Med Note BEVERLEE MERLYNN Repress Sep 29, 2023 10:37 PM) Taking Mondays, Wednesdays, and fridays  torsemide  (DEMADEX ) 20 MG tablet [id number] Yes Take  1.5 tablets (30 mg total) by mouth daily. Donette Ellouise LABOR, FNP  Active   Med List Note Reinhold Recardo HERO, CPhT 10/19/23 1104): [facility]            Recommendation:   PCP Follow-up Continue Current Plan of Care  Follow Up Plan:   Telephone follow-up in 1 month  Nestora Duos, MSN, RN [facility]  [facility], [facility] RN Care Manager Direct Dial: [phone] Fax: [phone]

## 2024-05-19 NOTE — Addendum Note (Signed)
 Addended by: Jakeob Tullis on: 05/19/2024 11:28 AM   Modules accepted: Orders

## 2024-05-19 NOTE — Telephone Encounter (Signed)
 Noted.  Alexis Mascot, MD

## 2024-05-19 NOTE — Telephone Encounter (Signed)
 Spoke with [facility] [employer] to have them to re-fax request paperwork. Representative verbalized understanding and confirmed fax contact information.

## 2024-05-20 MED ORDER — NYSTATIN 100000 UNIT/GM EX POWD: 1.0000 | Freq: Two times a day (BID) | CUTANEOUS | 3 refills | Status: AC | PRN

## 2024-05-20 NOTE — Addendum Note (Signed)
 Addended by: Vadhir Mcnay on: 05/20/2024 02:20 PM   Modules accepted: Orders

## 2024-05-24 NOTE — Progress Notes (Unsigned)
 Cardiology Office Note  Date:  05/26/2024   ID:  Alexis Farmer, DOB 26-Jan-1941, MRN [id number]  PCP:  Alexis Bruckner, MD   Chief Complaint  Patient presents with   6 month follow up    Patient c/o chest pain and shortness of breath and has occasional jaw pain when she is upset.     HPI:  Alexis Farmer is a 83 y.o. woman with history of  obesity,  insulin -dependent diabetes, Hemoglobin A1c >9 poor diet, hypertension,  hyperlipidemia,  moderate LVH  hospital 08/13/2014  chest pain, malignant hypertension, possible sleep apnea,  Anxiety Notes indicate previous history of GI bleeding  colonoscopy which was normal by her report Chronic lower extremity edema Chronic renal insufficiency Morbid obesity Right subclavian stenosis She presents to for follow-up of her hypertension, chronic diastolic CHF, poorly controlled diabetes, CKD  Last office visit with myself 1/24 Lives with granddaughter, she works at home Has a walker Reports having frequent falls at home Legs are weak, sedentary, no regular walking program Presents in wheelchair in the office today Sleeps all day  Completed PT for right leg and hip pain, symptoms have persisted Thinks that she needs to see orthopedics given symptoms in September 2024  Denies significant chest pain Relatively immobile at home Does not want to go to nursing home  Denies significant leg swelling Blood pressure well-controlled at home  Lab work reviewed  A1c 9.7 LDL 46, total chol 129 CR 7.93  Hospitalization February 2023, ileus Nausea vomiting diarrhea abdominal pain/gastroenteritis  EKG personally reviewed by myself on todays visit EKG Interpretation Date/Time:  Tuesday May 26 2024 10:03:37 EST Ventricular Rate:  65 PR Interval:  232 QRS Duration:  112 QT Interval:  456 QTC Calculation: 474 R Axis:   -59  Text Interpretation: Sinus rhythm with 1st degree A-V block Left axis deviation Left ventricular hypertrophy with  repolarization abnormality ( R in aVL , Cornell product , Romhilt-Estes ) Cannot rule out Septal infarct , age undetermined When compared with ECG of 05-Jan-2024 13:50, ST no longer elevated in Anterior leads T wave inversion more evident in Lateral leads Confirmed by Alexis Farmer [phone]) on 05/26/2024 10:08:13 AM    Other past medical history reviewed Followed by vascular in [facility] Carotid u/s  Normal flow hemodynamics were seen in the left subclavian  artery. The right subclavian artery has dampened monophasic flow,  retrograde vertebral artery flow, as well as difference in  brachial  artery pressuress suggestive of right subclavian steal   hospital end of June 2022 Symptoms of anxiety.  Patient reports that worsening tinnitus, lightheadedness and flushed.  She also report urinary frequency, urgency lower extremity edema, mild nausea.  Troponin of 25 felt secondary to elevated blood pressure  echocardiogram reviewed from June 2022   1. Left ventricular ejection fraction, by estimation, is 60 to 65%. The  left ventricle has normal function. The left ventricle has no regional  wall motion abnormalities. There is moderate concentric left ventricular  hypertrophy. Indeterminate diastolic  filling due to E-A fusion.   2. Right ventricular systolic function is normal. The right ventricular  size is normal. Tricuspid regurgitation signal is inadequate for assessing  PA pressure.   CT chest  Calcified coronary artery and aortic atherosclerosis. 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 Negative for thoracic aortic aneurysm or dissection. Calcified atherosclerosis involving the other proximal great vessels which appear patent  10/2016  D&C,  hysteroscopy.  Postmenopausal bleeding.   Previous Echocardiogram 08/13/2014 showed normal ejection fraction, normal RV size and function,  moderate LVH   Renal artery duplex showing no blockage of the mid to distal vessels bilaterally, challenging study    PMH:   has a past medical history of Acute on chronic heart failure with preserved ejection fraction (HFpEF) (HCC) (04/01/2023), Anxiety (09/13/2015), Arthritis, Atypical chest pain (01/15/2021), [place] (10/24/2021), Bradycardia (07/17/2016), Cervical spondylosis with radiculopathy (01/26/2017), Chickenpox, Chronic kidney disease, stage 2 (mild) (04/08/2014), Constipation (11/17/2019), Cough (01/25/2016), Diabetes mellitus without complication (HCC), Disorder of rotator cuff (06/24/2018), Diverticulitis, Dysuria (03/22/2017), Edema of foot (04/08/2014), Elevated troponin (12/15/2020), Essential hypertension (04/08/2014), Fall (03/26/2018), Gastroenteritis (08/12/2021), Gastrointestinal hemorrhage (08/03/2015), GI bleed, High cholesterol, History of blood transfusion, Hyperglycemia due to type 2 diabetes mellitus (HCC) (03/11/2015), Hypertension, Hypertensive urgency (12/15/2020), Low back pain (04/08/2014), Morbid obesity (HCC) (04/08/2014), Palpitations (12/15/2020), Peripheral edema (12/22/2019), Postmenopausal bleeding (07/17/2016), Primary hypertension (03/25/2020), Pure hypercholesterolemia (08/10/2020), Radiculopathy due to cervical spondylosis (01/26/2017), Recurrent falls (08/13/2019), Renal insufficiency, Skin cyst (10/24/2021), Stage 3a chronic kidney disease (HCC) (01/15/2021), Swelling of lower leg (07/19/2016), and Vertigo (10/13/2015).  PSH:    Past Surgical History:  Procedure Laterality Date   APPENDECTOMY     CHOLECYSTECTOMY     ECTOPIC PREGNANCY SURGERY     EYE SURGERY     bilateral cataracts   EYE SURGERY     02/11/2019 repair hole in right eye    gallbladder      HYSTEROSCOPY WITH D & C N/A 10/26/2016   Procedure: DILATATION AND CURETTAGE /HYSTEROSCOPY;  Surgeon: Verdon Keen, MD;  Location: [facility] ORS;  Service: Gynecology;  Laterality: N/A;   HYSTEROSCOPY  WITH D & C N/A 07/07/2018   Procedure: DILATATION AND CURETTAGE /HYSTEROSCOPY;  Surgeon: Verdon Keen, MD;  Location: [facility] ORS;  Service: Gynecology;  Laterality: N/A;   RIGHT HEART CATH Bilateral 10/10/2023   Procedure: RIGHT HEART CATH;  Surgeon: Cherrie Toribio SAUNDERS, MD;  Location: [facility] INVASIVE CV [facility];  Service: Cardiovascular;  Laterality: Bilateral;   THYROIDECTOMY, PARTIAL      Current Outpatient Medications  Medication Sig Dispense Refill   acetaminophen  (TYLENOL ) 500 MG tablet Take 500 mg by mouth every 6 (six) hours as needed for mild pain or headache.     Ascorbic Acid (VITAMIN C PO) Take 1 tablet by mouth daily.     aspirin  EC 81 MG tablet Take 81 mg by mouth daily. Swallow whole.     blood glucose meter kit and supplies KIT Accu chek, Dx code E11.65, check 3 times daily 1 each 0   Blood Pressure KIT 1 Device by Does not apply route daily. 1 kit 0   carvedilol  (COREG ) 6.25 MG tablet Take 1 tablet (6.25 mg total) by mouth 2 (two) times daily with a meal. 180 tablet 3   clopidogrel  (PLAVIX ) 75 MG tablet Take 1 tablet (75 mg total) by mouth daily. 90 tablet 2   [START ON 07/11/2024] gabapentin  (NEURONTIN ) 300 MG capsule Take 1 capsule (300 mg total) by mouth at bedtime. 90 capsule 1   glucose blood (ACCU-CHEK GUIDE TEST) test strip Use as instructed 300 each 3   hydrALAZINE  (APRESOLINE ) 100 MG tablet TAKE 1 TABLET BY MOUTH 4 TIMES DAILY. MUST KEEP UPCOMING APPOINTMENT ON 10/21/23 FOR FUTURE REFILLS. 360 tablet 0   insulin  NPH Human (NOVOLIN N) 100 UNIT/ML injection Inject 15-20 Units into the skin 2 (two) times daily before a meal. Inject 20 units in the morning an 15 units in the  evening.     insulin  regular (NOVOLIN R) 100 units/mL injection Inject 0-0.08 mLs into the skin.     Insulin  Syringe-Needle U-100 30G X 1/2 0.5 ML MISC Use it to give insulin  or check blood glucose, 4 times a day or as covered by the insurance 500 each 12   Multiple Vitamins-Minerals (CENTRUM SILVER 50+WOMEN)  TABS Take 1 tablet by mouth daily with breakfast.     nystatin  (MYCOSTATIN /NYSTOP ) powder Apply 1 Application topically 2 (two) times daily as needed. 60 g 3   rosuvastatin  (CRESTOR ) 20 MG tablet TAKE 1 TABLET BY MOUTH EVERY DAY 90 tablet 3   senna-docusate (SENOKOT-S) 8.6-50 MG tablet Take 1 tablet by mouth 2 (two) times daily. 180 tablet 1   spironolactone  (ALDACTONE ) 25 MG tablet Take 25 mg by mouth 3 (three) times a week. Monday Wednesday and friday     torsemide  (DEMADEX ) 20 MG tablet Take 1.5 tablets (30 mg total) by mouth daily. 42 tablet 5   diclofenac  Sodium (VOLTAREN ) 1 % GEL Apply 4 g topically 4 (four) times daily as needed. (Patient not taking: Reported on 05/26/2024) 350 g 4   fluticasone  (FLONASE ) 50 MCG/ACT nasal spray Place 1 spray into both nostrils daily. (Patient not taking: Reported on 05/26/2024) 16 g 1   ipratropium-albuterol  (DUONEB) 0.5-2.5 (3) MG/3ML SOLN Take 3 mLs by nebulization 4 (four) times daily. Take scheduled 4 times daily until wheezing and shortness of breath from RSV improves. Then take as needed. (Patient not taking: Reported on 05/26/2024)     lidocaine  (LIDODERM ) 5 % Place 1 patch onto the skin daily. Remove & Discard patch within 12 hours or as directed by MD (Patient not taking: Reported on 05/26/2024) 45 patch 0   No current facility-administered medications for this visit.    Allergies:   Celexa  [citalopram ], Jardiance  [empagliflozin ], Norvasc  [amlodipine ], Tape, Penicillin v, Penicillin v potassium, and Penicillins   Social History:  The patient  reports that she has never smoked. She has never used smokeless tobacco. She reports that she does not drink alcohol and does not use drugs.   Family History:   family history includes Diabetes in her mother, sister, and another family member; Healthy in her father; Heart disease in her sister; Hypertension in her mother; Stroke in her mother.    Review of Systems: Review of Systems  Constitutional: Negative.    Eyes: Negative.   Respiratory: Negative.    Cardiovascular: Negative.   Gastrointestinal: Negative.   Genitourinary: Negative.   Musculoskeletal:  Positive for joint pain.  Neurological: Negative.   Psychiatric/Behavioral: Negative.    All other systems reviewed and are negative.   PHYSICAL EXAM: VS:  BP 128/76 (BP Location: Left Arm, Patient Position: Sitting, Cuff Size: Normal)   Pulse 65   Ht 5' 3 (1.6 m)   SpO2 94%   BMI 35.43 kg/m  , BMI Body mass index is 35.43 kg/m. Constitutional:  oriented to person, place, and time. No distress.  HENT:  Head: Normocephalic and atraumatic.  Eyes:  no discharge. No scleral icterus.  Neck: Normal range of motion. Neck supple. No JVD present.  Cardiovascular: Normal rate, regular rhythm, normal heart sounds and intact distal pulses. Exam reveals no gallop and no friction rub. No edema No murmur heard. Pulmonary/Chest: Effort normal and breath sounds normal. No stridor. No respiratory distress.  no wheezes.  no rales.  no tenderness.  Abdominal: Soft.  no distension.  no tenderness.  Musculoskeletal: Normal range of motion.  no  tenderness or deformity.  Neurological:  normal muscle tone. Coordination normal. No atrophy Skin: Skin is warm and dry. No rash noted. not diaphoretic.  Psychiatric:  normal mood and affect. behavior is normal. Thought content normal.   Recent Labs: 10/09/2023: B Natriuretic Peptide 223.7 10/19/2023: TSH 1.955 10/23/2023: Magnesium  2.0 01/14/2024: ALT 17; BUN 31; Creatinine 1.77; Hemoglobin 11.7; Platelet Count 546; Potassium 4.1; Sodium 134    Lipid Panel Lab Results  Component Value Date   CHOL 140 10/20/2023   HDL 47 10/20/2023   LDLCALC 62 10/20/2023   TRIG 155 (H) 10/20/2023      Wt Readings from Last 3 Encounters:  05/01/24 200 lb (90.7 kg)  03/31/24 200 lb (90.7 kg)  03/18/24 200 lb (90.7 kg)     ASSESSMENT AND PLAN:  Essential hypertension, benign Blood pressure is well controlled on  today's visit. No changes made to the medications.  Venous insufficiency/chronic leg swelling Avoid calcium  channel blockers Minimal leg swelling on today's visit, stable renal function on current diuretic dosing  Mixed hyperlipidemia On crestor  20 daily Cholesterol at goal  Uncontrolled type 2 diabetes mellitus with stage 3 chronic kidney disease, with long-term current use of insulin  (HCC) Uncontrolled, A1C around 9, working with endocrine Long history of poorly controlled,  Creatinine 2.0 Unable to exercise for weight loss  CKD (chronic kidney disease) stage 3, GFR 30-59 ml/min Followed by nephrology, Dr. Dennise Mask torsemide , spironolactone , creatinine 2.0  Morbid obesity (HCC) Unable to exercise.  Leg weakness Activity limited by right leg pain, gait instability  Chronic diastolic CHF (congestive heart failure) (HCC) On torsemide  30 daily with spironolactone , euvolemic Minimal leg swelling on today's visit  PAD Followed by vascular Subclavian stenosis on the right, having minimal symptoms  Right leg pain Reports she has completed PT, no significant improvement in symptoms Would likely need referral to orthopedics versus pain clinic, she will contact primary care.    Orders Placed This Encounter  Procedures   EKG 12-Lead      Signed, Velinda Lunger, M.D., Ph.D. 05/26/2024  [facility] [place], [place] [phone]

## 2024-05-26 ENCOUNTER — Telehealth: Payer: Self-pay

## 2024-05-26 ENCOUNTER — Encounter: Payer: Self-pay | Admitting: Cardiovascular Disease

## 2024-05-26 ENCOUNTER — Ambulatory Visit: Attending: Cardiovascular Disease | Admitting: Cardiovascular Disease

## 2024-05-26 VITALS — BP 128/76 | HR 65 | Ht 63.0 in

## 2024-05-26 DIAGNOSIS — I2510 Atherosclerotic heart disease of native coronary artery without angina pectoris: Secondary | ICD-10-CM | POA: Diagnosis not present

## 2024-05-26 DIAGNOSIS — I5032 Chronic diastolic (congestive) heart failure: Secondary | ICD-10-CM | POA: Diagnosis not present

## 2024-05-26 DIAGNOSIS — I700 Atherosclerosis of aorta: Secondary | ICD-10-CM | POA: Diagnosis not present

## 2024-05-26 DIAGNOSIS — I10 Essential (primary) hypertension: Secondary | ICD-10-CM

## 2024-05-26 DIAGNOSIS — I272 Pulmonary hypertension, unspecified: Secondary | ICD-10-CM | POA: Diagnosis not present

## 2024-05-26 DIAGNOSIS — N1832 Chronic kidney disease, stage 3b: Secondary | ICD-10-CM

## 2024-05-26 DIAGNOSIS — I6523 Occlusion and stenosis of bilateral carotid arteries: Secondary | ICD-10-CM | POA: Diagnosis not present

## 2024-05-26 DIAGNOSIS — E785 Hyperlipidemia, unspecified: Secondary | ICD-10-CM

## 2024-05-26 DIAGNOSIS — G458 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: Secondary | ICD-10-CM | POA: Diagnosis not present

## 2024-05-26 DIAGNOSIS — I739 Peripheral vascular disease, unspecified: Secondary | ICD-10-CM

## 2024-05-26 NOTE — Patient Instructions (Signed)

## 2024-05-26 NOTE — Telephone Encounter (Signed)
 Copied from CRM [id number]. Topic: General - Other >> May 26, 2024  9:44 AM Burnard DEL wrote: Reason for CRM:  [facility] called in requesting most recent progress notes for patient for her diabetic shoes. They stated that provider signed off on form however they did not receive progress notes.  Fx#:[phone]

## 2024-05-26 NOTE — Telephone Encounter (Signed)
 Last clinical note from Dr Abbey was faxed over to fax contact information provided. Printed confirmation.

## 2024-06-03 ENCOUNTER — Other Ambulatory Visit: Payer: Self-pay

## 2024-06-03 ENCOUNTER — Emergency Department
Admission: EM | Admit: 2024-06-03 | Discharge: 2024-06-03 | Disposition: A | Discharge status: Home / Self Care | Source: Home / Self Care | Attending: Emergency Medicine | Admitting: Emergency Medicine

## 2024-06-03 ENCOUNTER — Emergency Department

## 2024-06-03 DIAGNOSIS — N189 Chronic kidney disease, unspecified: Secondary | ICD-10-CM | POA: Diagnosis not present

## 2024-06-03 DIAGNOSIS — R339 Retention of urine, unspecified: Secondary | ICD-10-CM | POA: Diagnosis present

## 2024-06-03 DIAGNOSIS — I130 Hypertensive heart and chronic kidney disease with heart failure and stage 1 through stage 4 chronic kidney disease, or unspecified chronic kidney disease: Secondary | ICD-10-CM | POA: Diagnosis not present

## 2024-06-03 DIAGNOSIS — E871 Hypo-osmolality and hyponatremia: Secondary | ICD-10-CM | POA: Insufficient documentation

## 2024-06-03 DIAGNOSIS — K5900 Constipation, unspecified: Secondary | ICD-10-CM | POA: Diagnosis not present

## 2024-06-03 DIAGNOSIS — I509 Heart failure, unspecified: Secondary | ICD-10-CM | POA: Diagnosis not present

## 2024-06-03 DIAGNOSIS — E1122 Type 2 diabetes mellitus with diabetic chronic kidney disease: Secondary | ICD-10-CM | POA: Diagnosis not present

## 2024-06-03 DIAGNOSIS — R300 Dysuria: Secondary | ICD-10-CM | POA: Insufficient documentation

## 2024-06-03 LAB — URINALYSIS, COMPLETE (UACMP) WITH MICROSCOPIC
Bilirubin Urine: NEGATIVE
Glucose, UA: NEGATIVE mg/dL
Hgb urine dipstick: NEGATIVE
Ketones, ur: NEGATIVE mg/dL
Leukocytes,Ua: NEGATIVE
Nitrite: NEGATIVE
Protein, ur: NEGATIVE mg/dL
Specific Gravity, Urine: 1.003 — ABNORMAL LOW (ref 1.005–1.030)
pH: 6 (ref 5.0–8.0)

## 2024-06-03 LAB — COMPREHENSIVE METABOLIC PANEL WITH GFR
ALT: 15 U/L (ref 0–44)
AST: 29 U/L (ref 15–41)
Albumin: 4 g/dL (ref 3.5–5.0)
Alkaline Phosphatase: 45 U/L (ref 38–126)
Anion gap: 12 (ref 5–15)
BUN: 30 mg/dL — ABNORMAL HIGH (ref 8–23)
CO2: 28 mmol/L (ref 22–32)
Calcium: 9.8 mg/dL (ref 8.9–10.3)
Chloride: 90 mmol/L — ABNORMAL LOW (ref 98–111)
Creatinine, Ser: 2.04 mg/dL — ABNORMAL HIGH (ref 0.44–1.00)
GFR, Estimated: 23 mL/min — ABNORMAL LOW (ref >60)
Glucose, Bld: 156 mg/dL — ABNORMAL HIGH (ref 70–99)
Potassium: 4.3 mmol/L (ref 3.5–5.1)
Sodium: 129 mmol/L — ABNORMAL LOW (ref 135–145)
Total Bilirubin: 0.5 mg/dL (ref 0.0–1.2)
Total Protein: 7.4 g/dL (ref 6.5–8.1)

## 2024-06-03 LAB — CBC
HCT: 33.9 % — ABNORMAL LOW (ref 36.0–46.0)
Hemoglobin: 11.1 g/dL — ABNORMAL LOW (ref 12.0–15.0)
MCH: 28.8 pg (ref 26.0–34.0)
MCHC: 32.7 g/dL (ref 30.0–36.0)
MCV: 88.1 fL (ref 80.0–100.0)
Platelets: 322 K/uL (ref 150–400)
RBC: 3.85 MIL/uL — ABNORMAL LOW (ref 3.87–5.11)
RDW: 12.9 % (ref 11.5–15.5)
WBC: 8.6 K/uL (ref 4.0–10.5)
nRBC: 0 % (ref 0.0–0.2)

## 2024-06-03 MED ORDER — LACTULOSE 20 GM/30ML PO SOLN: ORAL | 0 refills | Status: DC

## 2024-06-03 MED ORDER — LACTULOSE 10 GM/15ML PO SOLN
30.0000 g | Freq: Once | ORAL | Status: AC
  Administered 2024-06-03: 13:00:00 30 g via ORAL
  Filled 2024-06-03: qty 60

## 2024-06-03 MED ORDER — SODIUM CHLORIDE 0.9 % IV BOLUS
500.0000 mL | Freq: Once | INTRAVENOUS | Status: AC
  Administered 2024-06-03: 10:00:00 500 mL via INTRAVENOUS

## 2024-06-03 NOTE — ED Notes (Signed)
 Pt given dinner meal.  [facility] here for transport.  Iv removed.  Foley cath in place  pt alert.

## 2024-06-03 NOTE — Discharge Instructions (Addendum)
 Please call the number provided for urology to arrange a follow-up appointment in approximately 1 week for catheter removal.  Please follow-up with your doctor this week for recheck/reevaluation.  Please take your constipation medication (lactulose ) twice daily as needed for constipation.  You may take this in addition to your other constipation medications.  Return to the emergency department for any worsening abdominal discomfort any fever or any other symptom personally concerning to yourself.

## 2024-06-03 NOTE — ED Provider Notes (Addendum)
 [facility] Provider Note    Event Date/Time   First MD Initiated Contact with Patient 06/03/24 [phone]     (approximate)  History   Chief Complaint: Urinary Retention and Abdominal Pain  HPI  Alexis Farmer is a 83 y.o. female with a past medical history of CHF, anxiety, chronic constipation, hypertension, CKD, diabetes, presents to the emergency department for difficulty urinating.  According to the patient since yesterday she has been experiencing difficulty urinating.  She states yesterday she was having to push to urinate and today only a few drops were coming out.  Patient denies any dysuria.  No fever.  Does state lower abdominal pain/fullness.  Also states some constipation but again states this is chronic and largely unchanged.  Physical Exam   Triage Vital Signs: ED Triage Vitals  Encounter Vitals Group     BP      Girls Systolic BP Percentile      Girls Diastolic BP Percentile      Boys Systolic BP Percentile      Boys Diastolic BP Percentile      Pulse      Resp      Temp      Temp src      SpO2      Weight      Height      Head Circumference      Peak Flow      Pain Score      Pain Loc      Pain Education      Exclude from Growth Chart     Most recent vital signs: There were no vitals filed for this visit.  General: Awake, no distress.  CV:  Good peripheral perfusion.  Regular rate and rhythm  Resp:  Normal effort.  Equal breath sounds bilaterally.  Abd:  No distention.  Soft, mild epigastric tenderness.  No obvious fullness palpated, but difficult given habitus.  ED Results / Procedures / Treatments   RADIOLOGY  I have reviewed interpret the CT images.  No large obstruction or significant abnormality seen on my evaluation. Radiology has read some rectal wall thickening possible proctitis, moderate stool burden.   MEDICATIONS ORDERED IN ED: Medications  sodium chloride  0.9 % bolus 500 mL (has no administration in time  range)     IMPRESSION / MDM / ASSESSMENT AND PLAN / ED COURSE  I reviewed the triage vital signs and the nursing notes.  Patient's presentation is most consistent with acute presentation with potential threat to life or bodily function.  Patient presents emergency department with lower abdominal fullness and difficulty urinating.  We will check labs we will obtain a bladder scan as well as a urinalysis.  Differential would include retention, infection, among other intra-abdominal pathology.  Lab work today is reassuring showing a reassuring CBC with a normal white blood cell count chemistry shows no significant findings mild hyponatremia but not significantly changed from baseline.  Mild creatinine elevation but again not significantly changed from baseline.  Urinalysis shows no sign of infection.  Patient has a catheter in place states some relief drained approximately 650 cc of urine.  Patient CT scan shows mild Perry rectal thickening consistent with proctitis with moderate stool burden including right sided colonic stool highly suspect much of the patient's discomfort is going to be related to her chronic constipation.  Patient states she takes MiraLAX  as well as Senokot at home each day already chronically.  Given the patient's otherwise reassuring  workup I believe she would be safe for discharge home with outpatient follow-up.  Will place the patient on lactulose  and have her follow-up with her doctor.  Patient agreeable to plan.  Patient's social worker Vergia Daily [phone]) had called to speak to the nurse.  She left her number to speak with me.  I have attempted to call multiple times have left a message and have not heard back.  We will proceed with the plan to discharge.  FINAL CLINICAL IMPRESSION(S) / ED DIAGNOSES   Difficulty urinating Constipation   Note:  This document was prepared using Dragon voice recognition software and may include unintentional dictation errors.    Dorothyann Drivers, MD 06/03/24 1240    Dorothyann Drivers, MD 06/03/24 1416

## 2024-06-03 NOTE — ED Notes (Signed)
 [facility] called spoke with Octaviano regarding transfer

## 2024-06-03 NOTE — ED Triage Notes (Signed)
 Pt to ED via [facility] for c/o urinary retention and constipation. Pt reports taking diuretic, states only able to urinate once yesterday. Pt states pain and burning with urination and lower abd pain.

## 2024-06-03 NOTE — ED Notes (Signed)
 Pt placed on bed pan by this tech and RN Olam.

## 2024-06-05 ENCOUNTER — Inpatient Hospital Stay

## 2024-06-05 ENCOUNTER — Ambulatory Visit: Payer: Self-pay

## 2024-06-05 ENCOUNTER — Other Ambulatory Visit: Payer: Self-pay

## 2024-06-05 ENCOUNTER — Observation Stay
Admission: EM | Admit: 2024-06-05 | Discharge: 2024-06-14 | Disposition: A | Discharge status: Home / Self Care | Attending: Hospitalist | Admitting: Hospitalist

## 2024-06-05 ENCOUNTER — Emergency Department

## 2024-06-05 DIAGNOSIS — D120 Benign neoplasm of cecum: Secondary | ICD-10-CM | POA: Diagnosis not present

## 2024-06-05 DIAGNOSIS — R933 Abnormal findings on diagnostic imaging of other parts of digestive tract: Secondary | ICD-10-CM | POA: Insufficient documentation

## 2024-06-05 DIAGNOSIS — I152 Hypertension secondary to endocrine disorders: Secondary | ICD-10-CM | POA: Diagnosis present

## 2024-06-05 DIAGNOSIS — Z23 Encounter for immunization: Secondary | ICD-10-CM | POA: Diagnosis not present

## 2024-06-05 DIAGNOSIS — E1165 Type 2 diabetes mellitus with hyperglycemia: Secondary | ICD-10-CM

## 2024-06-05 DIAGNOSIS — I5032 Chronic diastolic (congestive) heart failure: Secondary | ICD-10-CM | POA: Diagnosis not present

## 2024-06-05 DIAGNOSIS — M6281 Muscle weakness (generalized): Secondary | ICD-10-CM | POA: Insufficient documentation

## 2024-06-05 DIAGNOSIS — K635 Polyp of colon: Secondary | ICD-10-CM

## 2024-06-05 DIAGNOSIS — Z79899 Other long term (current) drug therapy: Secondary | ICD-10-CM | POA: Diagnosis not present

## 2024-06-05 DIAGNOSIS — K529 Noninfective gastroenteritis and colitis, unspecified: Secondary | ICD-10-CM | POA: Insufficient documentation

## 2024-06-05 DIAGNOSIS — E66812 Obesity, class 2: Secondary | ICD-10-CM | POA: Insufficient documentation

## 2024-06-05 DIAGNOSIS — K5730 Diverticulosis of large intestine without perforation or abscess without bleeding: Secondary | ICD-10-CM | POA: Insufficient documentation

## 2024-06-05 DIAGNOSIS — N179 Acute kidney failure, unspecified: Principal | ICD-10-CM | POA: Insufficient documentation

## 2024-06-05 DIAGNOSIS — D72820 Lymphocytosis (symptomatic): Secondary | ICD-10-CM | POA: Insufficient documentation

## 2024-06-05 DIAGNOSIS — N1831 Chronic kidney disease, stage 3a: Secondary | ICD-10-CM | POA: Insufficient documentation

## 2024-06-05 DIAGNOSIS — R109 Unspecified abdominal pain: Secondary | ICD-10-CM | POA: Diagnosis present

## 2024-06-05 DIAGNOSIS — Z8673 Personal history of transient ischemic attack (TIA), and cerebral infarction without residual deficits: Secondary | ICD-10-CM | POA: Diagnosis not present

## 2024-06-05 DIAGNOSIS — E1159 Type 2 diabetes mellitus with other circulatory complications: Secondary | ICD-10-CM | POA: Diagnosis present

## 2024-06-05 DIAGNOSIS — N184 Chronic kidney disease, stage 4 (severe): Secondary | ICD-10-CM | POA: Diagnosis present

## 2024-06-05 DIAGNOSIS — I130 Hypertensive heart and chronic kidney disease with heart failure and stage 1 through stage 4 chronic kidney disease, or unspecified chronic kidney disease: Secondary | ICD-10-CM | POA: Insufficient documentation

## 2024-06-05 DIAGNOSIS — K641 Second degree hemorrhoids: Secondary | ICD-10-CM | POA: Diagnosis not present

## 2024-06-05 DIAGNOSIS — Z6839 Body mass index (BMI) 39.0-39.9, adult: Secondary | ICD-10-CM | POA: Diagnosis not present

## 2024-06-05 DIAGNOSIS — K559 Vascular disorder of intestine, unspecified: Secondary | ICD-10-CM | POA: Diagnosis present

## 2024-06-05 DIAGNOSIS — Z794 Long term (current) use of insulin: Secondary | ICD-10-CM | POA: Insufficient documentation

## 2024-06-05 DIAGNOSIS — R2681 Unsteadiness on feet: Secondary | ICD-10-CM | POA: Diagnosis not present

## 2024-06-05 DIAGNOSIS — R339 Retention of urine, unspecified: Secondary | ICD-10-CM | POA: Diagnosis not present

## 2024-06-05 DIAGNOSIS — E785 Hyperlipidemia, unspecified: Secondary | ICD-10-CM | POA: Insufficient documentation

## 2024-06-05 DIAGNOSIS — E66813 Obesity, class 3: Secondary | ICD-10-CM | POA: Diagnosis present

## 2024-06-05 DIAGNOSIS — M79604 Pain in right leg: Secondary | ICD-10-CM | POA: Diagnosis present

## 2024-06-05 DIAGNOSIS — F4321 Adjustment disorder with depressed mood: Secondary | ICD-10-CM | POA: Diagnosis not present

## 2024-06-05 DIAGNOSIS — I739 Peripheral vascular disease, unspecified: Secondary | ICD-10-CM | POA: Insufficient documentation

## 2024-06-05 DIAGNOSIS — E1122 Type 2 diabetes mellitus with diabetic chronic kidney disease: Secondary | ICD-10-CM | POA: Insufficient documentation

## 2024-06-05 DIAGNOSIS — R296 Repeated falls: Secondary | ICD-10-CM | POA: Insufficient documentation

## 2024-06-05 LAB — CBC WITH DIFFERENTIAL/PLATELET
Abs Immature Granulocytes: 0.05 K/uL (ref 0.00–0.07)
Basophils Absolute: 0.1 K/uL (ref 0.0–0.1)
Basophils Relative: 1 %
Eosinophils Absolute: 0.3 K/uL (ref 0.0–0.5)
Eosinophils Relative: 3 %
HCT: 37.2 % (ref 36.0–46.0)
Hemoglobin: 11.7 g/dL — ABNORMAL LOW (ref 12.0–15.0)
Immature Granulocytes: 1 %
Lymphocytes Relative: 43 %
Lymphs Abs: 4.5 K/uL — ABNORMAL HIGH (ref 0.7–4.0)
MCH: 28.5 pg (ref 26.0–34.0)
MCHC: 31.5 g/dL (ref 30.0–36.0)
MCV: 90.7 fL (ref 80.0–100.0)
Monocytes Absolute: 0.7 K/uL (ref 0.1–1.0)
Monocytes Relative: 7 %
Neutro Abs: 4.7 K/uL (ref 1.7–7.7)
Neutrophils Relative %: 45 %
Platelets: 180 K/uL (ref 150–400)
RBC: 4.1 MIL/uL (ref 3.87–5.11)
RDW: 12.8 % (ref 11.5–15.5)
WBC: 10.3 K/uL (ref 4.0–10.5)
nRBC: 0 % (ref 0.0–0.2)

## 2024-06-05 LAB — COMPREHENSIVE METABOLIC PANEL WITH GFR
ALT: 14 U/L (ref 0–44)
AST: 23 U/L (ref 15–41)
Albumin: 4.1 g/dL (ref 3.5–5.0)
Alkaline Phosphatase: 51 U/L (ref 38–126)
Anion gap: 12 (ref 5–15)
BUN: 41 mg/dL — ABNORMAL HIGH (ref 8–23)
CO2: 30 mmol/L (ref 22–32)
Calcium: 9.9 mg/dL (ref 8.9–10.3)
Chloride: 88 mmol/L — ABNORMAL LOW (ref 98–111)
Creatinine, Ser: 2.36 mg/dL — ABNORMAL HIGH (ref 0.44–1.00)
GFR, Estimated: 20 mL/min — ABNORMAL LOW
Glucose, Bld: 226 mg/dL — ABNORMAL HIGH (ref 70–99)
Potassium: 3.6 mmol/L (ref 3.5–5.1)
Sodium: 130 mmol/L — ABNORMAL LOW (ref 135–145)
Total Bilirubin: 1.3 mg/dL — ABNORMAL HIGH (ref 0.0–1.2)
Total Protein: 7.4 g/dL (ref 6.5–8.1)

## 2024-06-05 LAB — URINALYSIS, ROUTINE W REFLEX MICROSCOPIC
Bilirubin Urine: NEGATIVE
Glucose, UA: NEGATIVE mg/dL
Ketones, ur: NEGATIVE mg/dL
Leukocytes,Ua: NEGATIVE
Nitrite: NEGATIVE
Protein, ur: 30 mg/dL — AB
Specific Gravity, Urine: 1.004 — ABNORMAL LOW (ref 1.005–1.030)
pH: 5 (ref 5.0–8.0)

## 2024-06-05 LAB — TROPONIN T, HIGH SENSITIVITY
Troponin T High Sensitivity: 77 ng/L — ABNORMAL HIGH (ref 0–19)
Troponin T High Sensitivity: 74 ng/L — ABNORMAL HIGH (ref 0–19)

## 2024-06-05 LAB — MAGNESIUM: Magnesium: 2.6 mg/dL — ABNORMAL HIGH (ref 1.7–2.4)

## 2024-06-05 LAB — SALICYLATE LEVEL: Salicylate Lvl: <7 mg/dL — ABNORMAL LOW (ref 7.0–30.0)

## 2024-06-05 LAB — C DIFFICILE QUICK SCREEN W PCR REFLEX
C Diff antigen: POSITIVE — AB
C Diff toxin: NEGATIVE

## 2024-06-05 LAB — LACTIC ACID, PLASMA: Lactic Acid, Venous: 1.7 mmol/L (ref 0.5–1.9)

## 2024-06-05 LAB — ACETAMINOPHEN LEVEL: Acetaminophen (Tylenol), Serum: <10 ug/mL — ABNORMAL LOW (ref 10–30)

## 2024-06-05 LAB — LIPASE, BLOOD: Lipase: 22 U/L (ref 11–51)

## 2024-06-05 LAB — PROTIME-INR
INR: 1 (ref 0.8–1.2)
Prothrombin Time: 13.7 s (ref 11.4–15.2)

## 2024-06-05 MED ORDER — HYDRALAZINE HCL 50 MG PO TABS
100.0000 mg | ORAL_TABLET | Freq: Four times a day (QID) | ORAL | Status: DC
  Administered 2024-06-06 – 2024-06-08 (×8): 100 mg via ORAL
  Filled 2024-06-05 (×9): qty 2

## 2024-06-05 MED ORDER — ONDANSETRON HCL 4 MG PO TABS: 4.0000 mg | ORAL_TABLET | Freq: Four times a day (QID) | ORAL | Status: DC | PRN

## 2024-06-05 MED ORDER — ONDANSETRON HCL 4 MG/2ML IJ SOLN
4.0000 mg | Freq: Four times a day (QID) | INTRAMUSCULAR | Status: DC | PRN
  Filled 2024-06-05: qty 2

## 2024-06-05 MED ORDER — ACETAMINOPHEN 325 MG RE SUPP: 650.0000 mg | Freq: Four times a day (QID) | RECTAL | Status: DC | PRN

## 2024-06-05 MED ORDER — HYDROMORPHONE HCL 1 MG/ML IJ SOLN
0.2500 mg | INTRAMUSCULAR | Status: DC | PRN
  Administered 2024-06-05 – 2024-06-06 (×3): 0.25 mg via INTRAVENOUS
  Filled 2024-06-05 (×3): qty 0.5

## 2024-06-05 MED ORDER — ACETAMINOPHEN 325 MG PO TABS: 650.0000 mg | ORAL_TABLET | Freq: Four times a day (QID) | ORAL | Status: DC | PRN

## 2024-06-05 MED ORDER — SODIUM CHLORIDE 0.9% FLUSH
3.0000 mL | Freq: Two times a day (BID) | INTRAVENOUS | Status: DC
  Administered 2024-06-05 – 2024-06-14 (×15): 3 mL via INTRAVENOUS

## 2024-06-05 MED ORDER — INSULIN ASPART 100 UNIT/ML IJ SOLN
0.0000 [IU] | Freq: Every day | INTRAMUSCULAR | Status: DC
  Filled 2024-06-05: qty 2

## 2024-06-05 MED ORDER — ROSUVASTATIN CALCIUM 20 MG PO TABS
20.0000 mg | ORAL_TABLET | Freq: Every day | ORAL | Status: DC
  Administered 2024-06-06 – 2024-06-13 (×8): 20 mg via ORAL
  Filled 2024-06-05 (×8): qty 1

## 2024-06-05 MED ORDER — SODIUM CHLORIDE 0.9 % IV BOLUS
500.0000 mL | Freq: Once | INTRAVENOUS | Status: AC
  Administered 2024-06-05: 500 mL via INTRAVENOUS

## 2024-06-05 MED ORDER — INSULIN ASPART 100 UNIT/ML IJ SOLN
0.0000 [IU] | Freq: Three times a day (TID) | INTRAMUSCULAR | Status: DC
  Administered 2024-06-06 – 2024-06-07 (×3): 5 [IU] via SUBCUTANEOUS
  Administered 2024-06-08: 3 [IU] via SUBCUTANEOUS
  Administered 2024-06-08: 2 [IU] via SUBCUTANEOUS
  Administered 2024-06-08 – 2024-06-09 (×3): 5 [IU] via SUBCUTANEOUS
  Administered 2024-06-09: 8 [IU] via SUBCUTANEOUS
  Administered 2024-06-10: 11 [IU] via SUBCUTANEOUS
  Administered 2024-06-10: 5 [IU] via SUBCUTANEOUS
  Administered 2024-06-11: 2 [IU] via SUBCUTANEOUS
  Administered 2024-06-11: 3 [IU] via SUBCUTANEOUS
  Administered 2024-06-12: 5 [IU] via SUBCUTANEOUS
  Administered 2024-06-13: 8 [IU] via SUBCUTANEOUS
  Administered 2024-06-14: 5 [IU] via SUBCUTANEOUS
  Filled 2024-06-05: qty 5
  Filled 2024-06-05: qty 3
  Filled 2024-06-05: qty 8
  Filled 2024-06-05: qty 5
  Filled 2024-06-05: qty 8
  Filled 2024-06-05: qty 2
  Filled 2024-06-05 (×2): qty 5
  Filled 2024-06-05: qty 11
  Filled 2024-06-05: qty 3
  Filled 2024-06-05 (×2): qty 5
  Filled 2024-06-05: qty 2
  Filled 2024-06-05 (×6): qty 5
  Filled 2024-06-05: qty 3
  Filled 2024-06-05: qty 5

## 2024-06-05 MED ORDER — CLOPIDOGREL BISULFATE 75 MG PO TABS
75.0000 mg | ORAL_TABLET | Freq: Every day | ORAL | Status: DC
  Administered 2024-06-06 – 2024-06-14 (×8): 75 mg via ORAL
  Filled 2024-06-05 (×9): qty 1

## 2024-06-05 MED ORDER — HYDROMORPHONE HCL 1 MG/ML IJ SOLN: 0.5000 mg | INTRAMUSCULAR | Status: DC | PRN

## 2024-06-05 MED ORDER — LACTATED RINGERS IV SOLN: INTRAVENOUS | Status: AC

## 2024-06-05 MED ORDER — HEPARIN SODIUM (PORCINE) 5000 UNIT/ML IJ SOLN
5000.0000 [IU] | Freq: Three times a day (TID) | INTRAMUSCULAR | Status: DC
  Administered 2024-06-05 – 2024-06-14 (×20): 5000 [IU] via SUBCUTANEOUS
  Filled 2024-06-05 (×23): qty 1

## 2024-06-05 MED ORDER — MORPHINE SULFATE (PF) 2 MG/ML IV SOLN
2.0000 mg | Freq: Once | INTRAVENOUS | Status: AC
  Administered 2024-06-05: 2 mg via INTRAVENOUS
  Filled 2024-06-05: qty 1

## 2024-06-05 MED ORDER — SENNOSIDES-DOCUSATE SODIUM 8.6-50 MG PO TABS
2.0000 | ORAL_TABLET | Freq: Two times a day (BID) | ORAL | Status: DC
  Administered 2024-06-05 – 2024-06-13 (×14): 2 via ORAL
  Filled 2024-06-05 (×17): qty 2

## 2024-06-05 MED ORDER — CARVEDILOL 6.25 MG PO TABS
6.2500 mg | ORAL_TABLET | Freq: Two times a day (BID) | ORAL | Status: DC
  Administered 2024-06-06 – 2024-06-07 (×4): 6.25 mg via ORAL
  Filled 2024-06-05 (×4): qty 1

## 2024-06-05 NOTE — H&P (Signed)
 " History and Physical    Alexis Farmer FMW:[id number] DOB: 04-20-1941 DOA: 06/05/2024  DOS: the patient was seen and examined on 06/05/2024  PCP: Abbey Bruckner, MD   Patient coming from: Home  I have personally briefly reviewed patient's old medical records in [facility] and CareEverywhere  HPI:   Alexis Farmer is a 83 y.o. year old female with medical history of hypertension, hyperlipidemia, type 2 diabetes, CKD IV, HFpEF (EF 55% G1 DD and 09/2023), history of stroke presenting to the ED with complaint of abdominal pain that has been going on for several days.  Patient with complaint of abdominal pain that has been present for 1 week.  When asked more details regarding the abdominal pain she states it is located more in the back and thinks it is coming from her hip.  States this radiates down her right leg and impairs her mobility.  When asked further question regarding the abdominal pain, she states she has slight pain in the abdomen as well but mostly it is located in her back.  Denies any vomiting but does report nausea.  States she has been struggling with constipation.  She has been able to have a bowel movement with lactulose  that was given to her in the ED.  On ROS, she reports having right lower extremity weakness that has been present for 5 to 6 days.  She states this is limiting her mobility.  She is concerned that she may have a stroke given her history of stroke.  On arrival to the ED patient was noted to be HDS stable.  Lab work and imaging obtained.  CBC without leukocytosis, mild anemia that is at baseline.  CMP with mild hyponatremia, hypochloremia, significant hyperglycemia, AKI on CKD 4, lactic acid checked and normal, troponin checked and slightly elevated but downtrending.  UA with increased RBCs but normal WBCs and rare bacteria.  CT abdomen pelvis ordered without contrast that shows possible areas of wall thickening involving the hepatic and the proximal  transverse colon concerning for infectious versus inflammatory versus ischemic colitis, there is persistent rectal wall thickening concerning for proctitis along with bladder wall thickening concerning for cystitis.  Given the above findings, [facility] contacted for admission.  Review of Systems: As mentioned in the history of present illness. All other systems reviewed and are negative.   Past Medical History:  Diagnosis Date   Acute on chronic heart failure with preserved ejection fraction (HFpEF) (HCC) 04/01/2023   Anxiety 09/13/2015   Arthritis    Atypical chest pain 01/15/2021   Blackhead 10/24/2021   Bradycardia 07/17/2016   Formatting of this note might be different from the original.  Last Assessment & Plan:   Found to be bradycardic at outside hospital. They stopped metoprolol  and verapamil  and heart rate has been normal since then. Echo appears to have been reassuring at the outside facility. Appears to be doing much better with regards to this. She'll continue to monitor for recurrent symptoms. 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  Last Assessment & Plan:   Continues to have issues with this.  She is following with a specialist and it sounds as though they are planning on an MRI.  Benign exam today.   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 Dennise Deal of this note might be different from the original.  Dr. Dennise      Last Assessment & Plan:   Recheck kidney function today.   Constipation 11/17/2019   Cough 01/25/2016   Formatting of this note might be different from the original.  Last Assessment & Plan:   Minimal nighttime cough. Improves when she washes her pillows consistently. Suspect allergies contributing. She'll monitor.   Diabetes mellitus without complication (HCC)    one elevated reading/ no treatment   Disorder of rotator cuff 06/24/2018    Diverticulitis    Dysuria 03/22/2017   Formatting of this note might be different from the original.  Last Assessment & Plan:   Symptoms concerning for UTI.  Will check urinalysis.   Edema of foot 04/08/2014   Formatting of this note might be different from the original.  Last Assessment & Plan:   Chronic pedal edema. Suspect venous insufficiency. No orthopnea or shortness of breath. Advised elevation of her legs. Consider compression stockings in the future.   Elevated troponin 12/15/2020   Essential hypertension 04/08/2014   Formatting of this note might be different from the original.  Last Assessment & Plan:   Well-controlled on recheck.  Continue current regimen.   Fall 03/26/2018   Gastroenteritis 08/12/2021   Gastrointestinal hemorrhage 08/03/2015   GI bleed    High cholesterol    History of blood transfusion    Hyperglycemia due to type 2 diabetes mellitus (HCC) 03/11/2015   Hypertension    Hypertensive urgency 12/15/2020   Low back pain 04/08/2014   Morbid obesity (HCC) 04/08/2014   Formatting of this note might be different from the original.  Last Assessment & Plan:   Weight is stable. Discussed diet and exercise at length. Encouraged whatever exercise she can do. Given diet information.   Palpitations 12/15/2020   Peripheral edema 12/22/2019   Postmenopausal bleeding 07/17/2016   Formatting of this note might be different from the original.  Last Assessment & Plan:   Recent D&C. Following with gynecology. Benign findings. Monitor for recurrence.   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  Last Assessment & Plan:   Continues to have issues with this.  She is following with a specialist and it sounds as though they are planning on an MRI.  Benign exam today.   Recurrent falls 08/13/2019   Renal insufficiency    Skin cyst  10/24/2021   Stage 3a chronic kidney disease (HCC) 01/15/2021   Swelling of lower leg 07/19/2016   Vertigo 10/13/2015   Formatting of this note might be different from the original.  Last Assessment & Plan:   No recurrence. Discussed that meclizine  is an as needed medication and that she does not need to take this daily. She will continue to monitor.    Past Surgical History:  Procedure Laterality Date   APPENDECTOMY     CHOLECYSTECTOMY     ECTOPIC PREGNANCY SURGERY     EYE SURGERY     bilateral cataracts   EYE SURGERY     02/11/2019 repair hole in right eye    gallbladder      HYSTEROSCOPY WITH D & C N/A 10/26/2016   Procedure: DILATATION AND CURETTAGE /HYSTEROSCOPY;  Surgeon: Verdon Keen, MD;  Location: [facility];  Service: Gynecology;  Laterality: N/A;   HYSTEROSCOPY WITH D & C N/A 07/07/2018   Procedure: DILATATION AND CURETTAGE /HYSTEROSCOPY;  Surgeon: Verdon Keen, MD;  Location:  [facility];  Service: Gynecology;  Laterality: N/A;   RIGHT HEART CATH Bilateral 10/10/2023   Procedure: RIGHT HEART CATH;  Surgeon: Cherrie Toribio SAUNDERS, MD;  Location: [facility];  Service: Cardiovascular;  Laterality: Bilateral;   THYROIDECTOMY, PARTIAL       Allergies[1]  Family History  Problem Relation Age of Onset   Diabetes Mother    Hypertension Mother    Stroke Mother    Diabetes Other    Healthy Father    Diabetes Sister    Heart disease Sister     Prior to Admission medications  Medication Sig Start Date End Date Taking? Authorizing Provider  acetaminophen  (TYLENOL ) 500 MG tablet Take 500 mg by mouth every 6 (six) hours as needed for mild pain or headache.   Yes [provider]  Ascorbic Acid (VITAMIN C PO) Take 1 tablet by mouth daily.   Yes [provider]  aspirin  EC 81 MG tablet Take 81 mg by mouth daily. Swallow whole.   Yes [provider]  carvedilol  (COREG ) 6.25 MG tablet Take 1 tablet (6.25 mg total) by mouth 2 (two) times daily  with a meal. 02/23/24  Yes Bair, Luke, MD  clopidogrel  (PLAVIX ) 75 MG tablet Take 1 tablet (75 mg total) by mouth daily. 02/24/24  Yes Wittenborn, Barnie, NP  gabapentin  (NEURONTIN ) 300 MG capsule Take 1 capsule (300 mg total) by mouth at bedtime. 07/11/24  Yes Bair, Kalpana, MD  hydrALAZINE  (APRESOLINE ) 100 MG tablet TAKE 1 TABLET BY MOUTH 4 TIMES DAILY. MUST KEEP UPCOMING APPOINTMENT ON 10/21/23 FOR FUTURE REFILLS. 12/16/23  Yes Dorinda Drue DASEN, MD  insulin  NPH Human (NOVOLIN N) 100 UNIT/ML injection Inject 15-20 Units into the skin 2 (two) times daily before a meal. Inject 20 units in the morning an 15 units in the evening. 06/08/19  Yes [provider]  insulin  regular (NOVOLIN R) 100 units/mL injection Inject 0-0.08 mLs into the skin 3 (three) times daily before meals. Sliding scale   Yes [provider]  Multiple Vitamins-Minerals (CENTRUM SILVER 50+WOMEN) TABS Take 1 tablet by mouth daily with breakfast.   Yes [provider]  nystatin  (MYCOSTATIN /NYSTOP ) powder Apply 1 Application topically 2 (two) times daily as needed. 05/20/24  Yes Bair, Kalpana, MD  rosuvastatin  (CRESTOR ) 20 MG tablet TAKE 1 TABLET BY MOUTH EVERY DAY 02/23/24  Yes Bair, Kalpana, MD  senna-docusate (SENOKOT-S) 8.6-50 MG tablet Take 1 tablet by mouth 2 (two) times daily. 03/18/24  Yes Narendra, Nischal, MD  spironolactone  (ALDACTONE ) 25 MG tablet Take 25 mg by mouth 3 (three) times a week. Monday Wednesday and friday   Yes [provider]  torsemide  (DEMADEX ) 20 MG tablet Take 1.5 tablets (30 mg total) by mouth daily. 11/15/23 06/05/24 Yes [place] A, FNP  blood glucose meter kit and supplies KIT Accu chek, Dx code E11.65, check 3 times daily 07/26/17   Maribeth Camellia MATSU, MD  Blood Pressure KIT 1 Device by Does not apply route daily. 10/24/21   McLean-Scocuzza, Randine SAILOR, MD  diclofenac  Sodium (VOLTAREN ) 1 % GEL Apply 4 g topically 4 (four) times daily as needed. Patient not taking: Reported on  05/19/2024 05/11/24   Bair, Kalpana, MD  fluticasone  (FLONASE ) 50 MCG/ACT nasal spray Place 1 spray into both nostrils daily. Patient not taking: Reported on 05/19/2024 02/06/24   Bair, Kalpana, MD  glucose blood (ACCU-CHEK GUIDE TEST) test strip Use as instructed 04/16/24   Bair, Kalpana, MD  Insulin  Syringe-Needle U-100 30G X 1/2 0.5  ML MISC Use it to give insulin  or check blood glucose, 4 times a day or as covered by the insurance 05/11/24   Bair, Luke, MD  ipratropium-albuterol  (DUONEB) 0.5-2.5 (3) MG/3ML SOLN Take 3 mLs by nebulization 4 (four) times daily. Take scheduled 4 times daily until wheezing and shortness of breath from RSV improves. Then take as needed. Patient not taking: No sig reported    [provider]  Lactulose  20 GM/30ML SOLN Take 30grams orally BID prn constipation Patient not taking: Reported on 06/05/2024 06/03/24   Dorothyann Drivers, MD  lidocaine  (LIDODERM ) 5 % Place 1 patch onto the skin daily. Remove & Discard patch within 12 hours or as directed by MD Patient not taking: No sig reported 03/24/24   Abbey Luke, MD    Social History:  reports that she has never smoked. She has never used smokeless tobacco. She reports that she does not drink alcohol and does not use drugs. Patient uses a walker for mobility.  She does not smoke or drink alcohol.   Physical Exam: Vitals:   06/05/24 1630 06/05/24 1700 06/05/24 1730 06/05/24 1817  BP: (!) 139/45 (!) 174/47 (!) 165/49   Pulse: (!) 58 72 62   Resp: (!) 4 (!) 24 14   Temp:    97.7 F (36.5 C)  TempSrc:    Oral  SpO2: 94% 99% 100%     Gen: NAD HENT: NCAT CV: normal heart sounds Lung: CTAB Abd: Mild TTP of abdomen, normal bowel sounds Rectal: Performed with RN chaperone Olivia.  No hemorrhoids noted.  Slight stool present in the rectal vault without any blood. MSK: left lower extremity with more swelling then right. TTP of right hip.  Neuro: alert and oriented   Labs on Admission: I have  personally reviewed following labs and imaging studies  CBC: Recent Labs  Lab 06/03/24 0923 06/05/24 1424  WBC 8.6 10.3  NEUTROABS  --  4.7  HGB 11.1* 11.7*  HCT 33.9* 37.2  MCV 88.1 90.7  PLT 322 180   Basic Metabolic Panel: Recent Labs  Lab 06/03/24 0923 06/05/24 1424  NA 129* 130*  K 4.3 3.6  CL 90* 88*  CO2 28 30  GLUCOSE 156* 226*  BUN 30* 41*  CREATININE 2.04* 2.36*  CALCIUM  9.8 9.9  MG  --  2.6*   GFR: CrCl cannot be calculated (Unknown ideal weight.). Liver Function Tests: Recent Labs  Lab 06/03/24 0923 06/05/24 1424  AST 29 23  ALT 15 14  ALKPHOS 45 51  BILITOT 0.5 1.3*  PROT 7.4 7.4  ALBUMIN 4.0 4.1   Recent Labs  Lab 06/05/24 1424  LIPASE 22   No results for input(s): AMMONIA in the last 168 hours. Coagulation Profile: Recent Labs  Lab 06/05/24 1424  INR 1.0   Cardiac Enzymes: No results for input(s): CKTOTAL, CKMB, CKMBINDEX, TROPONINI, TROPONINIHS in the last 168 hours. BNP (last 3 results) Recent Labs    08/03/23 1938 09/29/23 1836 10/09/23 1317  BNP 735.5* 784.0* 223.7*   HbA1C: No results for input(s): HGBA1C in the last 72 hours. CBG: No results for input(s): GLUCAP in the last 168 hours. Lipid Profile: No results for input(s): CHOL, HDL, LDLCALC, TRIG, CHOLHDL, LDLDIRECT in the last 72 hours. Thyroid  Function Tests: No results for input(s): TSH, T4TOTAL, FREET4, T3FREE, THYROIDAB in the last 72 hours. Anemia Panel: No results for input(s): VITAMINB12, FOLATE, FERRITIN, TIBC, IRON, RETICCTPCT in the last 72 hours. Urine analysis:    Component Value Date/Time  COLORURINE STRAW (A) 06/05/2024 1448   APPEARANCEUR CLEAR (A) 06/05/2024 1448   APPEARANCEUR Clear 10/22/2018 0844   LABSPEC 1.004 (L) 06/05/2024 1448   PHURINE 5.0 06/05/2024 1448   GLUCOSEU NEGATIVE 06/05/2024 1448   HGBUR MODERATE (A) 06/05/2024 1448   BILIRUBINUR NEGATIVE 06/05/2024 1448   BILIRUBINUR neg  03/17/2024 1449   BILIRUBINUR Negative 10/22/2018 0844   KETONESUR NEGATIVE 06/05/2024 1448   PROTEINUR 30 (A) 06/05/2024 1448   UROBILINOGEN 0.2 03/17/2024 1449   UROBILINOGEN 0.2 03/09/2020 1022   NITRITE NEGATIVE 06/05/2024 1448   LEUKOCYTESUR NEGATIVE 06/05/2024 1448    Radiological Exams on Admission: I have personally reviewed images CT ABDOMEN PELVIS WO CONTRAST Result Date: 06/05/2024 CLINICAL DATA:  Trauma pain hypotension EXAM: CT ABDOMEN AND PELVIS WITHOUT CONTRAST TECHNIQUE: Multidetector CT imaging of the abdomen and pelvis was performed following the standard protocol without IV contrast. RADIATION DOSE REDUCTION: This exam was performed according to the departmental dose-optimization program which includes automated exposure control, adjustment of the mA and/or kV according to patient size and/or use of iterative reconstruction technique. COMPARISON:  CT 06/03/2024, 10/22/2022 FINDINGS: Lower chest: Lung bases demonstrate no acute airspace disease. Cardiomegaly with coronary vascular calcification. Hepatobiliary: No focal liver abnormality is seen. Status post cholecystectomy. No biliary dilatation. Pancreas: Unremarkable. No pancreatic ductal dilatation or surrounding inflammatory changes. Spleen: Normal in size without focal abnormality. Adrenals/Urinary Tract: Adrenal glands are normal. Kidneys show no hydronephrosis. Catheter in the bladder. Diffuse thick-walled appearance of bladder and some perivesical stranding. Small amount of gas in the urinary bladder. Stomach/Bowel: Stomach within normal limits. No dilated small bowel. Diffuse diverticular disease of the colon. Possible areas of wall thickening involving the hepatic flexure and proximal transverse colon. Persistent rectal wall thickening and mild perirectal/presacral edema. Vascular/Lymphatic: Aortic atherosclerosis. No enlarged abdominal or pelvic lymph nodes. Reproductive: Uterus and bilateral adnexa are unremarkable. Other:  No ascites or free air. Small fat containing umbilical hernia Musculoskeletal: Multilevel degenerative changes of the spine. Grade 1 anterolisthesis L4 on L5. No acute osseous abnormality. IMPRESSION: 1. Diffuse diverticular disease of the colon. Possible areas of wall thickening involving the hepatic flexure and proximal transverse colon, correlate for colitis of infectious, inflammatory or ischemic etiology. Persistent rectal wall thickening and mild perirectal/presacral edema, suspicious for proctitis. 2. Diffuse thick-walled appearance of the bladder with perivesical stranding, correlate for cystitis. Aortic Atherosclerosis (ICD10-I70.0). Electronically Signed   By: Luke Bun M.D.   On: 06/05/2024 16:43   CT HEAD WO CONTRAST ( ) Result Date: 06/05/2024 CLINICAL DATA:  Head trauma EXAM: CT HEAD WITHOUT CONTRAST TECHNIQUE: Contiguous axial images were obtained from the base of the skull through the vertex without intravenous contrast. RADIATION DOSE REDUCTION: This exam was performed according to the departmental dose-optimization program which includes automated exposure control, adjustment of the mA and/or kV according to patient size and/or use of iterative reconstruction technique. COMPARISON:  CT brain 01/05/2024, MRI 10/19/2023 FINDINGS: Brain: No acute territorial infarction, hemorrhage or intracranial mass. Moderate white matter hypodensity consistent with chronic small vessel ischemic changes. The ventricles are nonenlarged. Vascular: No hyperdense vessels. Vertebral and carotid vascular calcification Skull: Normal. Negative for fracture or focal lesion. Sinuses/Orbits: Mild mucosal thickening in the sinuses Other: None IMPRESSION: 1. No CT evidence for acute intracranial abnormality. 2. Moderate chronic small vessel ischemic changes of the white matter. Electronically Signed   By: Luke Bun M.D.   On: 06/05/2024 16:35    EKG: My personal interpretation of EKG shows: Sinus rhythm without  any acute ST  changes, there are PVCs.    Assessment/Plan Principal Problem:   Colitis Active Problems:   Hypertension associated with diabetes (HCC)   Chronic diastolic CHF (congestive heart failure) (HCC)   Uncontrolled type 2 diabetes mellitus with hyperglycemia, with long-term current use of insulin  (HCC)   CKD (chronic kidney disease) stage 4, GFR 15-29 ml/min (HCC)   PAD (peripheral artery disease)   Obesity, Class III, BMI 40-49.9 (morbid obesity) (HCC)   Monoclonal B-cell lymphocytosis of undetermined significance   History of CVA (cerebrovascular accident)  Colitis: Exact etiology unknown likely reactive secondary to loose stools.  Other differentials include infectious versus ischemic.  Ischemic is less likely given patient does not report any blood in her stools.  She states she checks her stools for blood and has not seen them.  She also does not have other systemic symptoms that can be seen ischemic colitis.  States had multiple bowel movements since last ED visit but chronically struggles with constipation.  Will consult GI to request their input.  Will continue supportive care with IVF.  T2DM: Start on moderate SSI  AKI on CKDIV: Likely secondary to decreased oral intake. Will continue IVF and monitor.   Right lower extremity weakness: states it is worse this morning. Very concerned about having stroke. Will order MRI. Outside of window for any acute interventions.   Right Hip pain: States has been present for 2 to 3 months. Will get plain radiography.   Class III obesity: We discussed benefits of weight loss with the patient regarding multiple comorbidities.  HFpEF: Euvolemic on exam.  Holding her torsemide  given AKI.  Hyperlipidemia/PAD: Continue home statin.  Hypertension: Will restart patient's beta-blocker but holding diuretics.  If blood pressure is elevated can restart hydralazine  as well.  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  She recommends continued  observation as patient is overall asymptomatic.  VTE prophylaxis:  SQ Heparin   Diet: Heart healthy Code Status:  Full Code Telemetry:  Admission status: Inpatient, Progressive Patient is from: Home Anticipated d/c is to: Home Anticipated d/c is in: 2-3 days   Family Communication: Updated at bedside  Consults called: GI   Severity of Illness: The appropriate patient status for this patient is INPATIENT. Inpatient status is judged to be reasonable and necessary in order to provide the required intensity of service to ensure the patient's safety. The patient's presenting symptoms, physical exam findings, and initial radiographic and laboratory data in the context of their chronic comorbidities is felt to place them at high risk for further clinical deterioration. Furthermore, it is not anticipated that the patient will be medically stable for discharge from the hospital within 2 midnights of admission.   * I certify that at the point of admission it is my clinical judgment that the patient will require inpatient hospital care spanning beyond 2 midnights from the point of admission due to high intensity of service, high risk for further deterioration and high frequency of surveillance required.DEWAINE Morene Bathe, MD [facility]      [1]  Allergies Allergen Reactions   Celexa  [Citalopram ]     Diarrhea upset stomach    Jardiance  [Empagliflozin ] Other (See Comments)    Reaction not recalled   Norvasc  [Amlodipine ]     Leg edema   Tape Other (See Comments)    Leaves the skin raw if left on for a period of time- tolerates paper tape   Penicillin V Rash   Penicillin V Potassium Rash  Penicillins Rash    Has patient had a PCN reaction causing immediate rash, facial/tongue/throat swelling, SOB or lightheadedness with hypotension: Yes Has patient had a PCN reaction causing severe rash involving mucus membranes or skin necrosis: No Has patient had a PCN reaction  that required hospitalization No Has patient had a PCN reaction occurring within the last 10 years: Yes If all of the above answers are NO, then may proceed with Cephalosporin use.    "

## 2024-06-05 NOTE — ED Provider Notes (Signed)
 "  [facility] Provider Note    Event Date/Time   First MD Initiated Contact with Patient 06/05/24 1501     (approximate)   History   Abdominal Pain   HPI  Alexis Farmer is a 83 y.o. female  CHF, anxiety, chronic constipation, hypertension, CKD, diabetes, who comes in with concerns for abdominal pain.  I reviewed the note where patient was seen 12/17 for concerns for urinary retention.  Patient had CT scan done concerning for proctitis with moderate stool burden and she was placed on lactulose .  Patient's had concerns of worsening abdominal pain noted be hypotensive into the 80s with EMS.  Patient reports worsening pain on the right side of her back into her right lower abdomen.  She reports the pain sometimes goes down into her right leg.  She reports this has been constant but she is taking the lactulose  with good bowel movements but still having significant pain.  She reports wondering if she could have had a stroke.  She reports on Monday she was able to ambulate but now she cannot ambulate secondary to difficulties with movement of the right leg.  She denies any chest pain, shortness of breath or any other concerns.  She does report potentially taking a little extra Tylenol  for her pain about a week ago where she states that she took 4 of them at 1 time and did this 2 times in the day.  Physical Exam   Triage Vital Signs: ED Triage Vitals  Encounter Vitals Group     BP 06/05/24 1422 97/82     Girls Systolic BP Percentile --      Girls Diastolic BP Percentile --      Boys Systolic BP Percentile --      Boys Diastolic BP Percentile --      Pulse Rate 06/05/24 1422 71     Resp 06/05/24 1422 16     Temp 06/05/24 1422 98.5 F (36.9 C)     Temp Source 06/05/24 1422 Oral     SpO2 06/05/24 1417 99 %     Weight --      Height --      Head Circumference --      Peak Flow --      Pain Score 06/05/24 1420 8     Pain Loc --      Pain Education --       Exclude from Growth Chart --     Most recent vital signs: Vitals:   06/05/24 1417 06/05/24 1422  BP:  97/82  Pulse:  71  Resp:  16  Temp:  98.5 F (36.9 C)  SpO2: 99% 97%     General: Awake, no distress.  CV:  Good peripheral perfusion.  Resp:  Normal effort.  Abd:  No distention.  Tender on the right side.  No rebound, no guarding Other:  Difficulty lifting up her right leg   ED Results / Procedures / Treatments   Labs (all labs ordered are listed, but only abnormal results are displayed) Labs Reviewed  CBC WITH DIFFERENTIAL/PLATELET  LIPASE, BLOOD  LACTIC ACID, PLASMA  LACTIC ACID, PLASMA  COMPREHENSIVE METABOLIC PANEL WITH GFR  URINALYSIS, ROUTINE W REFLEX MICROSCOPIC  TROPONIN T, HIGH SENSITIVITY     EKG  My interpretation of EKG:  Sinus cardia rate 115 without any ST elevation or T wave inversions, normal intervals  RADIOLOGY I have reviewed the ct personally and interpreted no evidence of intracranial hemorrhage  PROCEDURES:  Critical Care performed: No  Procedures   MEDICATIONS ORDERED IN ED: Medications  morphine  (PF) 2 MG/ML injection 2 mg (has no administration in time range)  sodium chloride  0.9 % bolus 500 mL (has no administration in time range)     IMPRESSION / MDM / ASSESSMENT AND PLAN / ED COURSE  I reviewed the triage vital signs and the nursing notes.   Patient's presentation is most consistent with acute presentation with potential threat to life or bodily function.   Patient comes in with concerns for pain.  She reports pain that has been there and worsening for the past 1 week.  She reports that did not improve after she was in the emergency room and was placed on laxatives.  She has no blood in her stool and she does report some increasing weakness.  Workup was done evaluate for Electra abnormality, AKI given her known renal dysfunction I did do a CT scan without contrast.  Her CT scan is concerning for possible colitis.  I  consider the possibly ischemic but patient reports no blood in her stool and lactate was normal I did discuss with Dr. Desiderio and obviously it is limited secondary to her not having contrast but given the normal lactate and pain has been ongoing for about a week this seems less likely.  They did recommend talking to GI.  I am do not feel at this time that we should do a CT with contrast to evaluate for ischemic colitis given my lower suspicion overall and the risk to her kidneys.  She does have a new AKI.  She did report some right-sided weakness with a CT head that was negative I do think she could benefit from MRI.  She is out of window for stroke code.  Would hold on antibiotics at this time given she is afebrile with normal white count.  I will discuss with possible team for admission for further workup and monitoring    The patient is on the cardiac monitor to evaluate for evidence of arrhythmia and/or significant heart rate changes.      FINAL CLINICAL IMPRESSION(S) / ED DIAGNOSES   Final diagnoses:  AKI (acute kidney injury)  Colitis     Rx / DC Orders   ED Discharge Orders     None        Note:  This document was prepared using Dragon voice recognition software and may include unintentional dictation errors.   Ernest Ronal BRAVO, MD 06/05/24 2108  "

## 2024-06-05 NOTE — Telephone Encounter (Signed)
Pt is currently at the ED

## 2024-06-05 NOTE — ED Notes (Signed)
 Pt transported to CT via stretcher.

## 2024-06-05 NOTE — ED Triage Notes (Signed)
 Pt BIB [facility] from home C/O abdominal pain X weeks. Hypotensive 82/40 with EMS.

## 2024-06-05 NOTE — Telephone Encounter (Signed)
 FYI Only or Action Required?: FYI only for provider: ED advised.  Patient was last seen in primary care on 05/11/2024 by Abbey Bruckner, MD.  Called Nurse Triage reporting Abdominal Pain.  Symptoms began several days ago.  Interventions attempted: OTC medications: APAP.  Symptoms are: unchanged.  Triage Disposition: Go to ED Now (Notify PCP)  Patient/caregiver understands and will follow disposition?:                      Copied from CRM [id number]. Topic: Clinical - Red Word Triage >> Jun 05, 2024 11:08 AM Adelita E wrote: [facility] that prompted transfer to Nurse Triage: Patient was released from hospital with a catheter, which patient did state this was not needed. Patient is having pain which is leading to irritability. Case Manager, Lutricia, on the line. >> Jun 05, 2024 11:16 AM Adelita E wrote: Janee could not hold longer, please call back at [phone]. Reason for Disposition  [1] SEVERE pain (e.g., excruciating) AND [2] present > 1 hour  Answer Assessment - Initial Assessment Questions Patient called to report severe right abdominal pain down leg that is not resolving and constant patient did not have transport to ER this writer dispatched 911 to patient     1. LOCATION: Where does it hurt?      Right lower abdominal pain radiating down leg to knee  2. RADIATION: Does the pain shoot anywhere else? (e.g., chest, back)     Yes down right leg to knee  3. ONSET: When did the pain begin? (e.g., minutes, hours or days ago)      12/17  5. PATTERN Does the pain come and go, or is it constant?     Constant wont go away always there  6. SEVERITY: How bad is the pain?  (e.g., Scale 1-10; mild, moderate, or severe)     8/10 , patient audible upset on call . Patient stating pain was bad enough to go back to ER but doesn't want to , since previous ER visit pain is not improved , but is able to sleep at night  8. CAUSE: What do you think is  causing the stomach pain? (e.g., gallstones, recent abdominal surgery)     Unsure does have a urinary catheter that was placed 12/17 has appt for removal 1/6 with voiding trail pending removal  9. RELIEVING/AGGRAVATING FACTORS: What makes it better or worse? (e.g., antacids, bending or twisting motion, bowel movement)     Taking APAP doesn't help   10. OTHER SYMPTOMS: Do you have any other symptoms? (e.g., back pain, diarrhea, fever, urination pain, vomiting) Reduced appetite  Passive SI calls made at start of call this writer did confirm safety         Denies the following chest pain,  fever , vomiting, blood in stools  Protocols used: Abdominal Pain - [facility]

## 2024-06-05 NOTE — ED Notes (Signed)
 Patient at MRI

## 2024-06-05 NOTE — ED Notes (Signed)
 Repositioned in bed and emptied foley bag.

## 2024-06-06 ENCOUNTER — Inpatient Hospital Stay

## 2024-06-06 DIAGNOSIS — E1165 Type 2 diabetes mellitus with hyperglycemia: Secondary | ICD-10-CM | POA: Diagnosis not present

## 2024-06-06 DIAGNOSIS — D72820 Lymphocytosis (symptomatic): Secondary | ICD-10-CM | POA: Diagnosis not present

## 2024-06-06 DIAGNOSIS — N184 Chronic kidney disease, stage 4 (severe): Secondary | ICD-10-CM

## 2024-06-06 DIAGNOSIS — E1159 Type 2 diabetes mellitus with other circulatory complications: Secondary | ICD-10-CM | POA: Diagnosis not present

## 2024-06-06 DIAGNOSIS — K529 Noninfective gastroenteritis and colitis, unspecified: Secondary | ICD-10-CM | POA: Diagnosis not present

## 2024-06-06 DIAGNOSIS — I5032 Chronic diastolic (congestive) heart failure: Secondary | ICD-10-CM

## 2024-06-06 DIAGNOSIS — E66813 Obesity, class 3: Secondary | ICD-10-CM

## 2024-06-06 DIAGNOSIS — I739 Peripheral vascular disease, unspecified: Secondary | ICD-10-CM

## 2024-06-06 DIAGNOSIS — D120 Benign neoplasm of cecum: Secondary | ICD-10-CM | POA: Diagnosis not present

## 2024-06-06 DIAGNOSIS — Z794 Long term (current) use of insulin: Secondary | ICD-10-CM | POA: Diagnosis not present

## 2024-06-06 DIAGNOSIS — M79604 Pain in right leg: Secondary | ICD-10-CM

## 2024-06-06 DIAGNOSIS — I152 Hypertension secondary to endocrine disorders: Secondary | ICD-10-CM

## 2024-06-06 DIAGNOSIS — Z8673 Personal history of transient ischemic attack (TIA), and cerebral infarction without residual deficits: Secondary | ICD-10-CM | POA: Diagnosis not present

## 2024-06-06 LAB — GASTROINTESTINAL PANEL BY PCR, STOOL (REPLACES STOOL CULTURE)

## 2024-06-06 LAB — IRON AND TIBC
Iron: 26 ug/dL — ABNORMAL LOW (ref 28–170)
Saturation Ratios: 13 % (ref 10.4–31.8)
TIBC: 207 ug/dL — ABNORMAL LOW (ref 250–450)
UIBC: 181 ug/dL

## 2024-06-06 LAB — VITAMIN B12: Vitamin B-12: 818 pg/mL (ref 180–914)

## 2024-06-06 LAB — CBG MONITORING, ED
Glucose-Capillary: 159 mg/dL — ABNORMAL HIGH (ref 70–99)
Glucose-Capillary: 221 mg/dL — ABNORMAL HIGH (ref 70–99)
Glucose-Capillary: 239 mg/dL — ABNORMAL HIGH (ref 70–99)

## 2024-06-06 LAB — CBC
HCT: 29.9 % — ABNORMAL LOW (ref 36.0–46.0)
Hemoglobin: 9.6 g/dL — ABNORMAL LOW (ref 12.0–15.0)
MCH: 28.8 pg (ref 26.0–34.0)
MCHC: 32.1 g/dL (ref 30.0–36.0)
MCV: 89.8 fL (ref 80.0–100.0)
Platelets: 277 K/uL (ref 150–400)
RBC: 3.33 MIL/uL — ABNORMAL LOW (ref 3.87–5.11)
RDW: 12.5 % (ref 11.5–15.5)
WBC: 8.1 K/uL (ref 4.0–10.5)
nRBC: 0 % (ref 0.0–0.2)

## 2024-06-06 LAB — RETICULOCYTES
Immature Retic Fract: 14 % (ref 2.3–15.9)
RBC.: 3.27 MIL/uL — ABNORMAL LOW (ref 3.87–5.11)
Retic Count, Absolute: 82.7 K/uL (ref 19.0–186.0)
Retic Ct Pct: 2.5 % (ref 0.4–3.1)

## 2024-06-06 LAB — BASIC METABOLIC PANEL WITH GFR
Anion gap: 10 (ref 5–15)
BUN: 38 mg/dL — ABNORMAL HIGH (ref 8–23)
CO2: 30 mmol/L (ref 22–32)
Calcium: 9 mg/dL (ref 8.9–10.3)
Chloride: 94 mmol/L — ABNORMAL LOW (ref 98–111)
Creatinine, Ser: 1.82 mg/dL — ABNORMAL HIGH (ref 0.44–1.00)
GFR, Estimated: 27 mL/min — ABNORMAL LOW
Glucose, Bld: 245 mg/dL — ABNORMAL HIGH (ref 70–99)
Potassium: 3.3 mmol/L — ABNORMAL LOW (ref 3.5–5.1)
Sodium: 134 mmol/L — ABNORMAL LOW (ref 135–145)

## 2024-06-06 LAB — GLUCOSE, CAPILLARY
Glucose-Capillary: 243 mg/dL — ABNORMAL HIGH (ref 70–99)
Glucose-Capillary: 221 mg/dL — ABNORMAL HIGH (ref 70–99)

## 2024-06-06 LAB — MAGNESIUM
Magnesium: 2.1 mg/dL (ref 1.7–2.4)
Magnesium: 2.2 mg/dL (ref 1.7–2.4)

## 2024-06-06 LAB — CK: Total CK: 203 U/L (ref 38–234)

## 2024-06-06 LAB — CLOSTRIDIUM DIFFICILE BY PCR, REFLEXED
Hypervirulent Strain: NEGATIVE
Toxigenic C. Difficile by PCR: NEGATIVE

## 2024-06-06 LAB — HEMOGLOBIN A1C
Hgb A1c MFr Bld: 8.8 % — ABNORMAL HIGH (ref 4.8–5.6)
Mean Plasma Glucose: 205.86 mg/dL

## 2024-06-06 LAB — FERRITIN: Ferritin: 410 ng/mL — ABNORMAL HIGH (ref 11–307)

## 2024-06-06 LAB — TSH: TSH: 1.89 u[IU]/mL (ref 0.350–4.500)

## 2024-06-06 LAB — FOLATE: Folate: >20 ng/mL

## 2024-06-06 MED ORDER — ACETAMINOPHEN 325 MG PO TABS
975.0000 mg | ORAL_TABLET | Freq: Four times a day (QID) | ORAL | Status: DC
  Administered 2024-06-06 – 2024-06-14 (×24): 975 mg via ORAL
  Filled 2024-06-06 (×28): qty 3

## 2024-06-06 MED ORDER — INFLUENZA VAC SPLIT HIGH-DOSE 0.5 ML IM SUSY
0.5000 mL | PREFILLED_SYRINGE | INTRAMUSCULAR | Status: AC
  Administered 2024-06-08: 0.5 mL via INTRAMUSCULAR
  Filled 2024-06-06 (×2): qty 0.5

## 2024-06-06 MED ORDER — OXYCODONE HCL 5 MG PO TABS
5.0000 mg | ORAL_TABLET | Freq: Four times a day (QID) | ORAL | Status: DC | PRN
  Administered 2024-06-06 – 2024-06-14 (×12): 5 mg via ORAL
  Filled 2024-06-06 (×12): qty 1

## 2024-06-06 MED ORDER — HYDROMORPHONE HCL 1 MG/ML IJ SOLN
0.5000 mg | INTRAMUSCULAR | Status: DC | PRN
  Administered 2024-06-07: 0.5 mg via INTRAVENOUS
  Filled 2024-06-06 (×3): qty 0.5

## 2024-06-06 MED ORDER — INSULIN GLARGINE 100 UNIT/ML ~~LOC~~ SOLN
10.0000 [IU] | Freq: Every day | SUBCUTANEOUS | Status: DC
  Administered 2024-06-07 – 2024-06-13 (×6): 10 [IU] via SUBCUTANEOUS
  Filled 2024-06-06 (×9): qty 0.1

## 2024-06-06 MED ORDER — INSULIN ASPART 100 UNIT/ML IJ SOLN
3.0000 [IU] | Freq: Three times a day (TID) | INTRAMUSCULAR | Status: DC
  Administered 2024-06-06 – 2024-06-07 (×2): 3 [IU] via SUBCUTANEOUS
  Filled 2024-06-06 (×2): qty 3

## 2024-06-06 MED ORDER — POTASSIUM CHLORIDE CRYS ER 20 MEQ PO TBCR
60.0000 meq | EXTENDED_RELEASE_TABLET | Freq: Once | ORAL | Status: AC
  Administered 2024-06-06: 60 meq via ORAL
  Filled 2024-06-06: qty 3

## 2024-06-06 MED ORDER — TRAZODONE HCL 50 MG PO TABS
50.0000 mg | ORAL_TABLET | Freq: Once | ORAL | Status: AC
  Administered 2024-06-06: 50 mg via ORAL
  Filled 2024-06-06: qty 1

## 2024-06-06 MED ORDER — LACTATED RINGERS IV SOLN: INTRAVENOUS | Status: AC

## 2024-06-06 MED ORDER — CHLORHEXIDINE GLUCONATE CLOTH 2 % EX PADS
6.0000 | MEDICATED_PAD | Freq: Every day | CUTANEOUS | Status: DC
  Administered 2024-06-06 – 2024-06-14 (×8): 6 via TOPICAL

## 2024-06-06 NOTE — ED Notes (Signed)
 Patient CBG not done when she prefers. She ate candy while waiting. CBG requires correction per [facility], patient refused stating  I am trying to kill her. Educated without medication compliance.

## 2024-06-06 NOTE — Plan of Care (Signed)
   Problem: Coping: Goal: Ability to adjust to condition or change in health will improve Outcome: Progressing   Problem: Fluid Volume: Goal: Ability to maintain a balanced intake and output will improve Outcome: Progressing

## 2024-06-06 NOTE — Evaluation (Signed)
 Physical Therapy Evaluation Patient Details Name: Edita Weyenberg MRN: [id number] DOB: 1941-02-09 Today's Date: 06/06/2024  History of Present Illness  83yoF who comes to [facility] after recent consipation, pt noted to be holding urine as well on arrival. Pt reports progression in RLE pain that has been limiting since Septmber this year, posterior/anterior groin/hip down to knee, no pain below knee. At baseline pt tolerates limited household AMB distances with walker, has WC for longer mobility needs. PMH: multiple CVA. PMHx: monoclonal B-cell lymphoma, stage IIIb CKD, DM, HTN, HFpEF, chronic pain, obesity, vertigo.  Clinical Impression  Pt reports RLE pain remains most limiting issue in global mobility, not improved since arrival. Pt requires minA for bed mobility, elevated surface for transfers, tolerance to weightbearing/stepping is poor, pt unable to achieve any appreciable AMB distances. Pt has WC in home, but would need assist to access all living areas, would need assist physical assist for bed mobility, pt does not feel DTR could provide this degree of assistance in her current condition, which means STR would ultimately be required to help pt remain mobile. Pt feels to DC back to home in current state would require her to essentially remain in bed. Will continue to follow.       If plan is discharge home, recommend the following: A lot of help with walking and/or transfers;Help with stairs or ramp for entrance;Assist for transportation;Assistance with cooking/housework   Can travel by private vehicle        Equipment Recommendations None recommended by PT  Recommendations for Other Services       Functional Status Assessment Patient has had a recent decline in their functional status and demonstrates the ability to make significant improvements in function in a reasonable and predictable amount of time.     Precautions / Restrictions Precautions Precautions: Fall Restrictions Weight  Bearing Restrictions Per Provider Order: No      Mobility  Bed Mobility Overal bed mobility: Needs Assistance Bed Mobility: Supine to Sit, Sit to Supine     Supine to sit: Min assist, HOB elevated Sit to supine: Min assist, HOB elevated        Transfers Overall transfer level: Needs assistance Equipment used: Rolling walker (2 wheels) Transfers: Sit to/from Stand Sit to Stand: From elevated surface, Contact guard assist                Ambulation/Gait Ambulation/Gait assistance:  (unable at this time;)           Pre-gait activities: marching in place x10, painful, weak, limited; 3 side steps to right;    Stairs            Wheelchair Mobility     Tilt Bed    Modified Rankin (Stroke Patients Only)       Balance                                             Pertinent Vitals/Pain Pain Assessment Pain Assessment: 0-10 Pain Score: 7  Pain Location: RLE Pain Descriptors / Indicators: Burning Pain Intervention(s): Limited activity within patient's tolerance, Monitored during session    Home Living Family/patient expects to be discharged to:: Private residence Living Arrangements: Other relatives Available Help at Discharge: Family (DTR, grandDTR) Type of Home: House Home Access: Level entry       Home Layout: One level Home Equipment: Agricultural Consultant (2 wheels);Rollator (4  wheels);BSC/3in1;Wheelchair - manual      Prior Function                       Extremity/Trunk Assessment                Communication        Cognition Arousal: Alert Behavior During Therapy: WFL for tasks assessed/performed   PT - Cognitive impairments: No apparent impairments                                 Cueing       General Comments      Exercises     Assessment/Plan    PT Assessment Patient needs continued PT services  PT Problem List Decreased strength;Decreased activity tolerance;Decreased  balance;Decreased mobility;Decreased safety awareness       PT Treatment Interventions DME instruction;Gait training;Stair training;Functional mobility training;Therapeutic activities;Therapeutic exercise;Balance training;Neuromuscular re-education;Patient/family education    PT Goals (Current goals can be found in the Care Plan section)  Acute Rehab PT Goals Patient Stated Goal: improve leg pain, strength independence with mobility PT Goal Formulation: With patient Time For Goal Achievement: 06/20/24 Potential to Achieve Goals: Fair    Frequency Min 2X/week     Co-evaluation               AM-PAC PT 6 Clicks Mobility  Outcome Measure Help needed turning from your back to your side while in a flat bed without using bedrails?: A Lot Help needed moving from lying on your back to sitting on the side of a flat bed without using bedrails?: A Lot Help needed moving to and from a bed to a chair (including a wheelchair)?: A Lot Help needed standing up from a chair using your arms (e.g., wheelchair or bedside chair)?: A Lot Help needed to walk in hospital room?: A Lot Help needed climbing 3-5 steps with a railing? : A Lot 6 Click Score: 12    End of Session Equipment Utilized During Treatment: Gait belt Activity Tolerance: Patient tolerated treatment well;No increased pain Patient left: in bed;with call bell/phone within reach Nurse Communication: Mobility status PT Visit Diagnosis: Other abnormalities of gait and mobility (R26.89);Muscle weakness (generalized) (M62.81);Difficulty in walking, not elsewhere classified (R26.2)    Time: 8942-8885 PT Time Calculation (min) (ACUTE ONLY): 17 min   Charges:   PT Evaluation $PT Eval Moderate Complexity: 1 Mod   PT General Charges $$ ACUTE PT VISIT: 1 Visit    12:49 PM, 06/06/2024 Peggye JAYSON Linear, PT, DPT Physical Therapist - [facility]  [phone] (ASCOM)    Kaimana Neuzil C 06/06/2024,  12:44 PM

## 2024-06-06 NOTE — Evaluation (Signed)
 Occupational Therapy Evaluation Patient Details Name: Alexis Farmer MRN: [id number] DOB: 16-Mar-1941 Today's Date: 06/06/2024   History of Present Illness   83yoF who comes to [facility] after recent consipation, pt noted to be holding urine as well on arrival. Pt reports progression in RLE pain that has been limiting since Septmber this year, posterior/anterior groin/hip down to knee, no pain below knee. At baseline pt tolerates limited household AMB distances with walker, has WC for longer mobility needs. PMH: multiple CVA. PMHx: monoclonal B-cell lymphoma, stage IIIb CKD, DM, HTN, HFpEF, chronic pain, obesity, vertigo.     Clinical Impressions Patient presenting with decreased Ind in self care,balance, functional mobility, transfers, endurance, and safety awareness. Patient reports being Mod I at baseline with use of AD and living with family. She reports granddaughter works during the day. Pt states she is able to bathe and dress herself but it has been getting much harder and now she is limited by pain. Pt is not at baseline during evaluation. Patient will benefit from acute OT to increase overall independence in the areas of ADLs, functional mobility, and safety awareness in order to safely discharge.     If plan is discharge home, recommend the following:   A lot of help with walking and/or transfers;A lot of help with bathing/dressing/bathroom;Assistance with cooking/housework;Assist for transportation;Help with stairs or ramp for entrance     Functional Status Assessment   Patient has had a recent decline in their functional status and demonstrates the ability to make significant improvements in function in a reasonable and predictable amount of time.     Equipment Recommendations   None recommended by OT     Recommendations for Other Services         Precautions/Restrictions   Precautions Precautions: Fall Restrictions Weight Bearing Restrictions Per Provider  Order: No     Mobility Bed Mobility Overal bed mobility: Needs Assistance Bed Mobility: Rolling Rolling: Mod assist              Transfers                   General transfer comment: Pt refuses      Balance                                           ADL either performed or assessed with clinical judgement   ADL Overall ADL's : Needs assistance/impaired Eating/Feeding: Set up;Bed level                                           Vision Patient Visual Report: No change from baseline       Perception         Praxis         Pertinent Vitals/Pain Pain Assessment Pain Assessment: Faces Faces Pain Scale: Hurts even more Pain Location: RLE Pain Descriptors / Indicators: Burning, Discomfort Pain Intervention(s): Limited activity within patient's tolerance, Monitored during session     Extremity/Trunk Assessment Upper Extremity Assessment Upper Extremity Assessment: Generalized weakness (Pt reports both my hands are numb from my old stroke)   Lower Extremity Assessment Lower Extremity Assessment: Defer to PT evaluation       Communication Communication Communication: Impaired Factors Affecting Communication: Hearing impaired   Cognition Arousal: Alert Behavior  During Therapy: WFL for tasks assessed/performed Cognition: No apparent impairments                               Following commands: Intact       Cueing  General Comments   Cueing Techniques: Verbal cues      Exercises     Shoulder Instructions      Home Living Family/patient expects to be discharged to:: Private residence Living Arrangements: Other relatives Available Help at Discharge: Family (granddaughter) Type of Home: House Home Access: Level entry     Home Layout: One level     Bathroom Shower/Tub: Sponge bathes at baseline;Tub/shower unit   Bathroom Toilet: Standard     Home Equipment: Agricultural Consultant (2  wheels);Rollator (4 wheels);BSC/3in1;Wheelchair - manual          Prior Functioning/Environment Prior Level of Function : Needs assist             Mobility Comments: ambulates short distances with RW ADLs Comments: Pt endorses family works during the day. she has been doing ADLs herself but it is getting more difficult at home./    OT Problem List: Decreased activity tolerance;Impaired balance (sitting and/or standing);Decreased safety awareness;Pain;Decreased strength   OT Treatment/Interventions: Self-care/ADL training;Balance training;Therapeutic exercise;Therapeutic activities;Energy conservation;Patient/family education      OT Goals(Current goals can be found in the care plan section)   Acute Rehab OT Goals Patient Stated Goal: to decrease pain OT Goal Formulation: With patient Time For Goal Achievement: 06/20/24 Potential to Achieve Goals: Fair ADL Goals Pt Will Perform Grooming: with supervision Pt Will Perform Lower Body Dressing: with supervision Pt Will Transfer to Toilet: with supervision Pt Will Perform Toileting - Clothing Manipulation and hygiene: with supervision   OT Frequency:  Min 2X/week    Co-evaluation              AM-PAC OT 6 Clicks Daily Activity     Outcome Measure Help from another person eating meals?: None Help from another person taking care of personal grooming?: A Little Help from another person toileting, which includes using toliet, bedpan, or urinal?: A Lot Help from another person bathing (including washing, rinsing, drying)?: A Little Help from another person to put on and taking off regular upper body clothing?: A Little Help from another person to put on and taking off regular lower body clothing?: A Lot 6 Click Score: 17   End of Session Nurse Communication: Mobility status  Activity Tolerance: Patient limited by pain Patient left: in bed;with call bell/phone within reach;with bed alarm set;with nursing/sitter in  room  OT Visit Diagnosis: Unsteadiness on feet (R26.81);Repeated falls (R29.6);Muscle weakness (generalized) (M62.81)                Time: 1212-1223 OT Time Calculation (min): 11 min Charges:  OT General Charges $OT Visit: 1 Visit OT Evaluation $OT Eval Moderate Complexity: 1 [address] [place], OTR/L , [facility] ascom [phone]  06/06/2024, 2:58 PM

## 2024-06-06 NOTE — Consult Note (Signed)
 "  Ruel Kung , MD [address], [place], [place], [address] Phone: [phone] Fax: [phone]  Consultation  Referring Provider:     ER Primary Care Physician:  Abbey Bruckner, MD Primary Gastroenterologist:  None       Reason for Consultation:     GI bleed  Date of Admission:  06/05/2024 Date of Consultation:  06/06/2024         HPI:   Alexis Farmer is a 83 y.o. female with a history of hypertension hyperlipidemia diabetes, CKD, history of stroke presented to the ER with abdominal pain that has been going on for several days as per the admission note there is a mention that the pain is mostly in the back not had a bowel movement struggling with constipation. In the emergency room underwent a CT scan of the abdomen pelvis without contrast that showed diffuse wall thickening of the hepatic flexure and proximal transverse colon persistent rectal wall thickening and mild perirectal presacral edema suspicious for proctitis.  x-ray of the hip showed no acute fracture moderate right hip osteoarthritis CT renal stone scan was performed on 06/03/2024 showed rectal wall thickening with perirectal haziness presacral edema indicative of proctitis as well as moderate stool burden  02/20/2018 colonoscopy showed a 4 mm cecal polyp noticing complete in the system 2020 2025: Hemoglobin 9.6 g 1 day back was 11.7 calcium  3.3, creatinine 1.82  I have been called to evaluate for the colitis, she denies any diarrhea she says she is mostly constipated denies any significant abdominal pain she says the main issue she has coming in for her legs.  Denies any rectal bleeding.  Past Medical History:  Diagnosis Date   Acute on chronic heart failure with preserved ejection fraction (HFpEF) (HCC) 04/01/2023   Anxiety 09/13/2015   Arthritis    Atypical chest pain 01/15/2021   Blackhead 10/24/2021   Bradycardia 07/17/2016   Formatting of this note might be different from the original.  Last  Assessment & Plan:   Found to be bradycardic at outside hospital. They stopped metoprolol  and verapamil  and heart rate has been normal since then. Echo appears to have been reassuring at the outside facility. Appears to be doing much better with regards to this. She'll continue to monitor for recurrent symptoms. 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  Last Assessment & Plan:   Continues to have issues with this.  She is following with a specialist and it sounds as though they are planning on an MRI.  Benign exam today.   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 Dennise Deal of this note might be different from the original.  Dr. Dennise      Last Assessment & Plan:   Recheck kidney function today.   Constipation 11/17/2019   Cough 01/25/2016   Formatting of this note might be different from the original.  Last Assessment & Plan:   Minimal nighttime cough. Improves when she washes her pillows consistently. Suspect allergies contributing. She'll monitor.   Diabetes mellitus without complication (HCC)    one elevated reading/ no treatment   Disorder of rotator cuff 06/24/2018   Diverticulitis    Dysuria 03/22/2017   Formatting of this note might be different from the original.  Last Assessment & Plan:   Symptoms concerning for UTI.  Will check urinalysis.  Edema of foot 04/08/2014   Formatting of this note might be different from the original.  Last Assessment & Plan:   Chronic pedal edema. Suspect venous insufficiency. No orthopnea or shortness of breath. Advised elevation of her legs. Consider compression stockings in the future.   Elevated troponin 12/15/2020   Essential hypertension 04/08/2014   Formatting of this note might be different from the original.  Last Assessment & Plan:   Well-controlled on recheck.  Continue current regimen.   Fall 03/26/2018    Gastroenteritis 08/12/2021   Gastrointestinal hemorrhage 08/03/2015   GI bleed    High cholesterol    History of blood transfusion    Hyperglycemia due to type 2 diabetes mellitus (HCC) 03/11/2015   Hypertension    Hypertensive urgency 12/15/2020   Low back pain 04/08/2014   Morbid obesity (HCC) 04/08/2014   Formatting of this note might be different from the original.  Last Assessment & Plan:   Weight is stable. Discussed diet and exercise at length. Encouraged whatever exercise she can do. Given diet information.   Palpitations 12/15/2020   Peripheral edema 12/22/2019   Postmenopausal bleeding 07/17/2016   Formatting of this note might be different from the original.  Last Assessment & Plan:   Recent D&C. Following with gynecology. Benign findings. Monitor for recurrence.   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  Last Assessment & Plan:   Continues to have issues with this.  She is following with a specialist and it sounds as though they are planning on an MRI.  Benign exam today.   Recurrent falls 08/13/2019   Renal insufficiency    Skin cyst 10/24/2021   Stage 3a chronic kidney disease (HCC) 01/15/2021   Swelling of lower leg 07/19/2016   Vertigo 10/13/2015   Formatting of this note might be different from the original.  Last Assessment & Plan:   No recurrence. Discussed that meclizine  is an as needed medication and that she does not need to take this daily. She will continue to monitor.    Past Surgical History:  Procedure Laterality Date   APPENDECTOMY     CHOLECYSTECTOMY     ECTOPIC PREGNANCY SURGERY     EYE SURGERY     bilateral cataracts   EYE SURGERY     02/11/2019 repair hole in right eye    gallbladder      HYSTEROSCOPY WITH D & C N/A 10/26/2016   Procedure: DILATATION AND CURETTAGE /HYSTEROSCOPY;  Surgeon: Verdon Keen, MD;  Location: [facility];  Service: Gynecology;  Laterality: N/A;   HYSTEROSCOPY WITH D & C N/A 07/07/2018   Procedure: DILATATION AND CURETTAGE /HYSTEROSCOPY;  Surgeon: Verdon Keen, MD;  Location: [facility];  Service: Gynecology;  Laterality: N/A;   RIGHT HEART CATH Bilateral 10/10/2023   Procedure: RIGHT HEART CATH;  Surgeon: Cherrie Toribio SAUNDERS, MD;  Location: [facility];  Service: Cardiovascular;  Laterality: Bilateral;   THYROIDECTOMY, PARTIAL      Prior to Admission medications  Medication Sig Start Date End Date Taking? Authorizing Provider  acetaminophen  (TYLENOL ) 500 MG tablet Take 500 mg by mouth every 6 (six) hours as needed for mild pain or headache.   Yes [provider]  Ascorbic Acid (VITAMIN C PO) Take 1 tablet by mouth daily.   Yes [provider]  aspirin  EC 81 MG tablet Take 81 mg by  mouth daily. Swallow whole.   Yes [provider]  carvedilol  (COREG ) 6.25 MG tablet Take 1 tablet (6.25 mg total) by mouth 2 (two) times daily with a meal. 02/23/24  Yes Bair, Luke, MD  clopidogrel  (PLAVIX ) 75 MG tablet Take 1 tablet (75 mg total) by mouth daily. 02/24/24  Yes Wittenborn, Barnie, NP  gabapentin  (NEURONTIN ) 300 MG capsule Take 1 capsule (300 mg total) by mouth at bedtime. 07/11/24  Yes Bair, Kalpana, MD  hydrALAZINE  (APRESOLINE ) 100 MG tablet TAKE 1 TABLET BY MOUTH 4 TIMES DAILY. MUST KEEP UPCOMING APPOINTMENT ON 10/21/23 FOR FUTURE REFILLS. 12/16/23  Yes Djan, Drue DASEN, MD  insulin  NPH Human (NOVOLIN N) 100 UNIT/ML injection Inject 15-20 Units into the skin 2 (two) times daily before a meal. Inject 20 units in the morning an 15 units in the evening. 06/08/19  Yes [provider]  insulin  regular (NOVOLIN R) 100 units/mL injection Inject 0-0.08 mLs into the skin 3 (three) times daily before meals. Sliding scale   Yes [provider]  Multiple Vitamins-Minerals (CENTRUM SILVER 50+WOMEN) TABS Take 1 tablet by mouth daily  with breakfast.   Yes [provider]  nystatin  (MYCOSTATIN /NYSTOP ) powder Apply 1 Application topically 2 (two) times daily as needed. 05/20/24  Yes Bair, Kalpana, MD  rosuvastatin  (CRESTOR ) 20 MG tablet TAKE 1 TABLET BY MOUTH EVERY DAY 02/23/24  Yes Bair, Kalpana, MD  senna-docusate (SENOKOT-S) 8.6-50 MG tablet Take 1 tablet by mouth 2 (two) times daily. 03/18/24  Yes Narendra, Nischal, MD  spironolactone  (ALDACTONE ) 25 MG tablet Take 25 mg by mouth 3 (three) times a week. Monday Wednesday and friday   Yes [provider]  torsemide  (DEMADEX ) 20 MG tablet Take 1.5 tablets (30 mg total) by mouth daily. 11/15/23 06/05/24 Yes [place] A, FNP  blood glucose meter kit and supplies KIT Accu chek, Dx code E11.65, check 3 times daily 07/26/17   Maribeth Camellia MATSU, MD  Blood Pressure KIT 1 Device by Does not apply route daily. 10/24/21   McLean-Scocuzza, Randine SAILOR, MD  diclofenac  Sodium (VOLTAREN ) 1 % GEL Apply 4 g topically 4 (four) times daily as needed. Patient not taking: Reported on 05/19/2024 05/11/24   Bair, Kalpana, MD  fluticasone  (FLONASE ) 50 MCG/ACT nasal spray Place 1 spray into both nostrils daily. Patient not taking: Reported on 05/19/2024 02/06/24   Bair, Kalpana, MD  glucose blood (ACCU-CHEK GUIDE TEST) test strip Use as instructed 04/16/24   Bair, Kalpana, MD  Insulin  Syringe-Needle U-100 30G X 1/2 0.5 ML MISC Use it to give insulin  or check blood glucose, 4 times a day or as covered by the insurance 05/11/24   Bair, Luke, MD  ipratropium-albuterol  (DUONEB) 0.5-2.5 (3) MG/3ML SOLN Take 3 mLs by nebulization 4 (four) times daily. Take scheduled 4 times daily until wheezing and shortness of breath from RSV improves. Then take as needed. Patient not taking: No sig reported    [provider]  Lactulose  20 GM/30ML SOLN Take 30grams orally BID prn constipation Patient not taking: Reported on 06/05/2024 06/03/24   Dorothyann Drivers, MD  lidocaine  (LIDODERM ) 5 % Place 1  patch onto the skin daily. Remove & Discard patch within 12 hours or as directed by MD Patient not taking: No sig reported 03/24/24   Abbey Luke, MD    Family History  Problem Relation Age of Onset   Diabetes Mother    Hypertension Mother    Stroke Mother    Diabetes Other    Healthy Father  Diabetes Sister    Heart disease Sister      Social History[1]  Allergies as of 06/05/2024 - Review Complete 06/05/2024  Allergen Reaction Noted   Celexa  [citalopram ]  12/29/2020   Jardiance  [empagliflozin ] Other (See Comments) 08/25/2020   Norvasc  [amlodipine ]  04/19/2021   Tape Other (See Comments) 12/15/2020   Penicillin v Rash 08/15/2020   Penicillin v potassium Rash 07/10/2022   Penicillins Rash 03/25/2014    Review of Systems:    All systems reviewed and negative except where noted in HPI.   Physical Exam:  Vital signs in last 24 hours: Temp:  [97.7 F (36.5 C)-98.7 F (37.1 C)] 98.6 F (37 C) (12/20 0601) Pulse Rate:  [58-82] 69 (12/20 0601) Resp:  [4-24] 16 (12/20 0601) BP: (96-179)/(43-82) 142/48 (12/20 0601) SpO2:  [93 %-100 %] 100 % (12/20 0601)   General:   Pleasant, cooperative in NAD Head:  Normocephalic and atraumatic. Eyes:   No icterus.   Conjunctiva pink. PERRLA. Ears:  Normal auditory acuity. Neck:  Supple; no masses or thyroidomegaly Lungs: Respirations even and unlabored. Lungs clear to auscultation bilaterally.   No wheezes, crackles, or rhonchi.  Heart:  Regular rate and rhythm;  Without murmur, clicks, rubs or gallops Abdomen:  Soft, nondistended, nontender. Normal bowel sounds. No appreciable masses or hepatomegaly.  No rebound or guarding.  Neurologic:  Alert and oriented x3;  grossly normal neurologically. Skin:  Intact without significant lesions or rashes. Cervical Nodes:  No significant cervical adenopathy. Psych:  Alert and cooperative. Normal affect.  LAB RESULTS: Recent Labs    06/03/24 0923 06/05/24 1424 06/06/24 0613  WBC 8.6 10.3  8.1  HGB 11.1* 11.7* 9.6*  HCT 33.9* 37.2 29.9*  PLT 322 180 277   BMET Recent Labs    06/03/24 0923 06/05/24 1424 06/06/24 0613  NA 129* 130* 134*  K 4.3 3.6 3.3*  CL 90* 88* 94*  CO2 28 30 30   GLUCOSE 156* 226* 245*  BUN 30* 41* 38*  CREATININE 2.04* 2.36* 1.82*  CALCIUM  9.8 9.9 9.0   LFT Recent Labs    06/05/24 1424  PROT 7.4  ALBUMIN 4.1  AST 23  ALT 14  ALKPHOS 51  BILITOT 1.3*   PT/INR Recent Labs    06/05/24 1424  LABPROT 13.7  INR 1.0    STUDIES: US  Venous Img Lower Bilateral (DVT) Result Date: 06/06/2024 EXAM: ULTRASOUND DUPLEX OF THE BILATERAL LOWER EXTREMITY VEINS TECHNIQUE: Duplex ultrasound using B-mode/gray scaled imaging and Doppler spectral analysis and color flow was obtained of the deep venous structures of the bilateral lower extremity. COMPARISON: None available. CLINICAL HISTORY: Swelling. FINDINGS: LEFT: The common femoral vein, femoral vein, and popliteal vein demonstrate normal compressibility with normal color flow and spectral analysis. There is no evidence of deep venous thrombosis. The infrapopliteal veins are suboptimally demonstrated. RIGHT: The common femoral vein, femoral vein, and popliteal vein demonstrate normal compressibility with normal color flow and spectral analysis. There is no evidence of deep venous thrombosis. The infrapopliteal veins are suboptimally demonstrated. IMPRESSION: 1. No evidence of DVT in the visualized veins. 2. Infrapopliteal veins are suboptimally demonstrated bilaterally. Electronically signed by: Evalene Coho MD 06/06/2024 07:09 AM EST RP Workstation: [id number]   MR BRAIN WO CONTRAST Result Date: 06/06/2024 CLINICAL DATA:  Follow-up examination for stroke. EXAM: MRI HEAD WITHOUT CONTRAST TECHNIQUE: Multiplanar, multiecho pulse sequences of the brain and surrounding structures were obtained without intravenous contrast. COMPARISON:  CT from earlier the same day. FINDINGS: Brain: Cerebral volume within  normal limits. Patchy and confluent T2/FLAIR hyperintensity involving the periventricular and deep white matter of both cerebral hemispheres, consistent with chronic small vessel ischemic disease, moderate in nature. Encephalomalacia and gliosis involving the right occipital lobe, consistent with a chronic infarct. No abnormal foci of restricted diffusion to suggest acute or subacute ischemia. Gray-white matter differentiation otherwise maintained. No acute or significant chronic intracranial blood products. No mass lesion, midline shift or mass effect. No hydrocephalus or extra-axial fluid collection. Pituitary gland within normal limits. Vascular: Major intracranial vascular flow voids are maintained. Skull and upper cervical spine: Craniocervical junction within normal limits. Bone marrow signal intensity normal. No scalp soft tissue abnormality. Sinuses/Orbits: Prior bilateral ocular lens replacement. Scattered mucosal thickening present throughout the sphenoid ethmoidal sinuses. No significant mastoid effusion. Other: None. IMPRESSION: 1. No acute intracranial abnormality. 2. Chronic right occipital infarct. 3. Underlying moderate chronic microvascular ischemic disease. Electronically Signed   By: Morene Hoard M.D.   On: 06/06/2024 00:02   DG HIP UNILAT WITH PELVIS MIN 4 VIEWS RIGHT Result Date: 06/05/2024 CLINICAL DATA:  Hip pain. EXAM: DG HIP (WITH OR WITHOUT PELVIS) 4+V RIGHT COMPARISON:  Reformats from abdominopelvic CT earlier today FINDINGS: Technically limited due to difficulty with positioning. No evidence of acute fracture of the pelvis or right hip. Moderate right hip osteoarthritis. The pubic rami are intact. No pubic symphyseal or sacroiliac diastasis. Vascular calcifications are seen. IMPRESSION: 1. No acute fracture of the pelvis or right hip. 2. Moderate right hip osteoarthritis. Electronically Signed   By: Andrea Gasman M.D.   On: 06/05/2024 20:53   CT ABDOMEN PELVIS WO  CONTRAST Result Date: 06/05/2024 CLINICAL DATA:  Trauma pain hypotension EXAM: CT ABDOMEN AND PELVIS WITHOUT CONTRAST TECHNIQUE: Multidetector CT imaging of the abdomen and pelvis was performed following the standard protocol without IV contrast. RADIATION DOSE REDUCTION: This exam was performed according to the departmental dose-optimization program which includes automated exposure control, adjustment of the mA and/or kV according to patient size and/or use of iterative reconstruction technique. COMPARISON:  CT 06/03/2024, 10/22/2022 FINDINGS: Lower chest: Lung bases demonstrate no acute airspace disease. Cardiomegaly with coronary vascular calcification. Hepatobiliary: No focal liver abnormality is seen. Status post cholecystectomy. No biliary dilatation. Pancreas: Unremarkable. No pancreatic ductal dilatation or surrounding inflammatory changes. Spleen: Normal in size without focal abnormality. Adrenals/Urinary Tract: Adrenal glands are normal. Kidneys show no hydronephrosis. Catheter in the bladder. Diffuse thick-walled appearance of bladder and some perivesical stranding. Small amount of gas in the urinary bladder. Stomach/Bowel: Stomach within normal limits. No dilated small bowel. Diffuse diverticular disease of the colon. Possible areas of wall thickening involving the hepatic flexure and proximal transverse colon. Persistent rectal wall thickening and mild perirectal/presacral edema. Vascular/Lymphatic: Aortic atherosclerosis. No enlarged abdominal or pelvic lymph nodes. Reproductive: Uterus and bilateral adnexa are unremarkable. Other: No ascites or free air. Small fat containing umbilical hernia Musculoskeletal: Multilevel degenerative changes of the spine. Grade 1 anterolisthesis L4 on L5. No acute osseous abnormality. IMPRESSION: 1. Diffuse diverticular disease of the colon. Possible areas of wall thickening involving the hepatic flexure and proximal transverse colon, correlate for colitis of  infectious, inflammatory or ischemic etiology. Persistent rectal wall thickening and mild perirectal/presacral edema, suspicious for proctitis. 2. Diffuse thick-walled appearance of the bladder with perivesical stranding, correlate for cystitis. Aortic Atherosclerosis (ICD10-I70.0). Electronically Signed   By: Luke Bun M.D.   On: 06/05/2024 16:43   CT HEAD WO CONTRAST ( ) Result Date: 06/05/2024 CLINICAL DATA:  Head trauma EXAM: CT HEAD WITHOUT CONTRAST  TECHNIQUE: Contiguous axial images were obtained from the base of the skull through the vertex without intravenous contrast. RADIATION DOSE REDUCTION: This exam was performed according to the departmental dose-optimization program which includes automated exposure control, adjustment of the mA and/or kV according to patient size and/or use of iterative reconstruction technique. COMPARISON:  CT brain 01/05/2024, MRI 10/19/2023 FINDINGS: Brain: No acute territorial infarction, hemorrhage or intracranial mass. Moderate white matter hypodensity consistent with chronic small vessel ischemic changes. The ventricles are nonenlarged. Vascular: No hyperdense vessels. Vertebral and carotid vascular calcification Skull: Normal. Negative for fracture or focal lesion. Sinuses/Orbits: Mild mucosal thickening in the sinuses Other: None IMPRESSION: 1. No CT evidence for acute intracranial abnormality. 2. Moderate chronic small vessel ischemic changes of the white matter. Electronically Signed   By: Luke Bun M.D.   On: 06/05/2024 16:35      Impression / Plan:   Alexis Farmer is a 83 y.o. y/o female with history of CKD, stroke, hypertension hyperlipidemia diabetes presents emergency room with abdominal discomfort CT scan of the abdomen shows diffuse diverticular disease of colon and possible wall thickening of the hepatic flexure proximal transverse colon and suggest to correlate for colitis or inflammatory or ischemic.  Concerns also for cystitis.  The  location of the inflammation in the colon is not typical for ischemic colitis.  Differentials would include stercoral ulcer with proctitis from constipation versus inflammatory bowel disease  Plan 1.  I discussed with her about having a colonoscopy to have it looked inside she is not sure whether she wants to proceed yet she would like to think about it I said I will be back tomorrow and we can talk about it more.  I do believe if she does decide to proceed she may need 2 days of cleanout due to prior history of significant constipation.  Thank you for involving me in the care of this patient.      LOS: 1 day   Ruel Kung, MD  06/06/2024, 8:52 AM         [1]  Social History Tobacco Use   Smoking status: Never   Smokeless tobacco: Never  Vaping Use   Vaping status: Never Used  Substance Use Topics   Alcohol use: No   Drug use: No   "

## 2024-06-06 NOTE — Progress Notes (Signed)
 " PROGRESS NOTE  Alexis Farmer FMW:[id number] DOB: [birth year]   PCP: Bair, Kalpana, MD  Patient is from: Home.  Lives with granddaughter.  Uses rolling walker for mobility  DOA: 06/05/2024 LOS: 1  Chief complaints Chief Complaint  Patient presents with   Abdominal Pain     Brief Narrative / Interim history: 83 year old F with PMH of HFpEF, PAH, CVA, CKD-4, DM-2, HTN, HLD, MGUS and morbid obesity presenting with abdominal pain, right leg pain and weakness, and admitted with working diagnosis of colitis, possible cystitis, AKI and right leg weakness.   Patient reported abdominal pain for about a week on admission.  The next day, she reports right-sided pain from mid abdomen to her right ongoing for about 3 months.  Seems like she was seen by PCP on 11/24 and referred to orthopedic surgery for possible steroid injection.  She was seen in ED on 12/17 for constipation and urinary tension and discharged with Foley catheter.  Denies nausea, vomiting or diarrhea.  Patient reports severe pain in right hip and right thigh that she describes as burning pain.  No provoking factor.  Pain medication helps.  She denies numbness or tingling.  Denies fall or trauma.  In ED, stable vitals.  Na 130. Cr 2.36 (b/l~1.7).  BUN 41.  CBC without significant finding.  Troponin 77.  Lactic acid 1.7.  UA with moderate Hgb and 6-10 RBC.  CT head without acute finding.  CT abdomen and pelvis raise concern for colitis, proctitis and cystitis.  Received fluid bolus and analgesics, and admitted for further care.  MRI brain, pelvic x-ray and lower extremity venous Doppler ordered.  MRI brain without acute finding.  Pelvic x-ray concerning for moderate right hip osteoarthritis.  Lower extremity venous Doppler negative.    Subjective: Seen and examined earlier this morning.  No major events overnight or this morning.  Continues to endorse right-sided pain pointing at right hip down to her right knee.  Describes the pain  as burning.  No provoking factor.  Some improvement with pain medication.  She rates her pain severe.  Denies trauma or fall.  Reports using right leg more since she had a stroke due to left leg weakness.  Denies numbness or tingling.  Denies nausea or vomiting.   reports using stool softener for constipation   Assessment and plan: Right hip/leg pain/right leg weakness: Likely due to osteoarthritis as noted on pelvic x-ray and right femoral CT.  Reports more dependence on right leg due to left leg weakness after stroke.  Seems like she was referred to orthopedic surgery by PCP last month.  MRI brain without acute finding.  Does not look like radiculopathic either.  Lower extremity venous Doppler negative for DVT.  Exam with diffuse tenderness in her right thigh and right hip. -Multimodal pain control with scheduled Tylenol , as needed oxycodone  and Dilaudid .  Discontinue morphine  -Continue home gabapentin  -PT/OT eval  Abnormal CT abdomen: Raise concern for colitis, proctitis and cystitis.  Not having GI symptoms other than constipation.  Has Foley catheter for urinary tension.  UA not convincing for UTI.  C. difficile antigen positive toxin and PCR negative.  GIP negative as well -Bowel regimen for constipation  AKI on CKD-4: b/l Cr ~1.7.  Likely prerenal and concurrent use of diuretics.  Improving. Recent Labs    10/19/23 1109 10/20/23 2102 10/21/23 9348 10/22/23 0440 10/23/23 0329 11/15/23 1127 01/14/24 0943 06/03/24 0923 06/05/24 1424 06/06/24 0613  BUN 42* 35* 32* 28* 27* 23 31* 30*  41* 38*  CREATININE 2.57* 1.98* 1.79* 1.62* 1.51* 1.65* 1.77* 2.04* 2.36* 1.82*  - Continue IV fluid - Continue holding diuretic - Monitor urine output.  IDDM-2 with hyperglycemia: A1c 8.8%.  She is on Novolin N and R at home.  Recent Labs  Lab 06/06/24 0037 06/06/24 0739  GLUCAP 239* 221*  -On Lantus  10 units daily -Continue SSI-moderate -Add NovoLog  3 units 3 times daily with meals -Further  adjustment as appropriate.  Chronic HFpEF/severe PAH: TTE in 09/2023 is LVEF of 60 to 65%, G1DD and RVSP of 80 mmHg.  No cardiopulmonary symptoms.  Appears euvolemic on exam but difficult due to body habitus.  On torsemide  and Aldactone  at home -Continue holding torsemide  and Aldactone  -Closely monitor fluid and respiratory status while on IV fluid  Essential hypertension: BP slightly elevated - Pain control - Continue home Coreg  - Hold diuretics  History of CVA/PAD/hyperlipidemia -Continue home aspirin , Plavix  and statin.  Urinary retention: Patient had a Foley catheter placed on 12/17.  Had about 630 cc urine output when Foley was placed.  Could be due to constipation - Will attempt voiding trial prior to discharge if she is mobile - Manage constipation  Hypokalemia -Monitor replenish K and Mg as appropriate  Hyponatremia: Mild.  Improved. -Continue monitor  Constipation -Continue scheduled Senokot with as needed MiraLAX .   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  She recommends continued observation as patient is overall asymptomatic.  Class II obesity Body mass index is 35.43 kg/m.          DVT prophylaxis:  heparin  injection 5,000 Units Start: 06/05/24 2200  Code Status: Full code Family Communication: None at the bedside Level of care: Med-Surg Status is: Inpatient Remains inpatient appropriate because: Right hip/leg pain, AKI   Final disposition: To be determined   55 minutes with more than 50% spent in reviewing records, counseling patient/family and coordinating care.  Consultants:  None  Procedures: None  Microbiology summarized: GIP negative C. difficile antigen positive but toxin and PCR negative  Objective: Vitals:   06/06/24 0041 06/06/24 0130 06/06/24 0601 06/06/24 1032  BP: (!) 96/51 (!) 162/43 (!) 142/48   Pulse:  82 69   Resp:  12 16   Temp:   98.6 F (37 C) 98.2 F (36.8 C)  TempSrc:   Oral Oral  SpO2:  98% 100%   Height:    5'  3 (1.6 m)    Examination:  GENERAL: No apparent distress.  Nontoxic. HEENT: MMM.  Vision and hearing grossly intact.  NECK: Supple.  No apparent JVD.  RESP:  No IWOB.  Fair aeration bilaterally. CVS:  RRR. Heart sounds normal.  ABD/GI/GU: BS+. Abd soft, NTND.  Foley catheter in place. MSK/EXT:  Moves extremities.  Subtotal weakness in right leg mainly due to pain.  Diffuse tenderness in right hip and right thigh.  No apparent swelling, or skin change. SKIN: no apparent skin lesion or wound NEURO: AA.  Oriented appropriately.  No apparent focal neuro deficit. PSYCH: Calm. Normal affect.   Sch Meds:  Scheduled Meds:  acetaminophen   975 mg Oral Q6H WA   carvedilol   6.25 mg Oral BID WC   clopidogrel   75 mg Oral Daily   heparin   5,000 Units Subcutaneous Q8H   hydrALAZINE   100 mg Oral Q6H   insulin  aspart  0-15 Units Subcutaneous TID WC   insulin  aspart  0-5 Units Subcutaneous QHS   insulin  glargine  10 Units Subcutaneous Daily   rosuvastatin   20  mg Oral Daily   senna-docusate  2 tablet Oral BID   sodium chloride  flush  3 mL Intravenous Q12H   Continuous Infusions:  lactated ringers  75 mL/hr at 06/06/24 1058   PRN Meds:.HYDROmorphone  (DILAUDID ) injection, ondansetron  **OR** ondansetron  (ZOFRAN ) IV, oxyCODONE   Antimicrobials: Anti-infectives (From admission, onward)    None        I have personally reviewed the following labs and images: CBC: Recent Labs  Lab 06/03/24 0923 06/05/24 1424 06/06/24 0613  WBC 8.6 10.3 8.1  NEUTROABS  --  4.7  --   HGB 11.1* 11.7* 9.6*  HCT 33.9* 37.2 29.9*  MCV 88.1 90.7 89.8  PLT 322 180 277   BMP &GFR Recent Labs  Lab 06/03/24 0923 06/05/24 1424 06/06/24 0613  NA 129* 130* 134*  K 4.3 3.6 3.3*  CL 90* 88* 94*  CO2 28 30 30   GLUCOSE 156* 226* 245*  BUN 30* 41* 38*  CREATININE 2.04* 2.36* 1.82*  CALCIUM  9.8 9.9 9.0  MG  --  2.6* 2.2  2.1   CrCl cannot be calculated (Unknown ideal weight.). Liver & Pancreas: Recent  Labs  Lab 06/03/24 0923 06/05/24 1424  AST 29 23  ALT 15 14  ALKPHOS 45 51  BILITOT 0.5 1.3*  PROT 7.4 7.4  ALBUMIN 4.0 4.1   Recent Labs  Lab 06/05/24 1424  LIPASE 22   No results for input(s): AMMONIA in the last 168 hours. Diabetic: Recent Labs    06/05/24 1424  HGBA1C 8.8*   Recent Labs  Lab 06/06/24 0037 06/06/24 0739  GLUCAP 239* 221*   Cardiac Enzymes: Recent Labs  Lab 06/06/24 0613  CKTOTAL 203   No results for input(s): PROBNP in the last 8760 hours. Coagulation Profile: Recent Labs  Lab 06/05/24 1424  INR 1.0   Thyroid  Function Tests: Recent Labs    06/06/24 0613  TSH 1.890   Lipid Profile: No results for input(s): CHOL, HDL, LDLCALC, TRIG, CHOLHDL, LDLDIRECT in the last 72 hours. Anemia Panel: No results for input(s): VITAMINB12, FOLATE, FERRITIN, TIBC, IRON, RETICCTPCT in the last 72 hours. Urine analysis:    Component Value Date/Time   COLORURINE STRAW (A) 06/05/2024 1448   APPEARANCEUR CLEAR (A) 06/05/2024 1448   APPEARANCEUR Clear 10/22/2018 0844   LABSPEC 1.004 (L) 06/05/2024 1448   PHURINE 5.0 06/05/2024 1448   GLUCOSEU NEGATIVE 06/05/2024 1448   HGBUR MODERATE (A) 06/05/2024 1448   BILIRUBINUR NEGATIVE 06/05/2024 1448   BILIRUBINUR neg 03/17/2024 1449   BILIRUBINUR Negative 10/22/2018 0844   KETONESUR NEGATIVE 06/05/2024 1448   PROTEINUR 30 (A) 06/05/2024 1448   UROBILINOGEN 0.2 03/17/2024 1449   UROBILINOGEN 0.2 03/09/2020 1022   NITRITE NEGATIVE 06/05/2024 1448   LEUKOCYTESUR NEGATIVE 06/05/2024 1448   Sepsis Labs: Invalid input(s): PROCALCITONIN, LACTICIDVEN  Microbiology: Recent Results (from the past 240 hours)  Gastrointestinal Panel by PCR , Stool     Status: None   Collection Time: 06/05/24 10:18 PM   Specimen: Stool  Result Value Ref Range Status   Campylobacter species NOT DETECTED NOT DETECTED Final   Plesimonas shigelloides NOT DETECTED NOT DETECTED Final   Salmonella  species NOT DETECTED NOT DETECTED Final   Yersinia enterocolitica NOT DETECTED NOT DETECTED Final   Vibrio species NOT DETECTED NOT DETECTED Final   Vibrio cholerae NOT DETECTED NOT DETECTED Final   Enteroaggregative E coli (EAEC) NOT DETECTED NOT DETECTED Final   Enteropathogenic E coli (EPEC) NOT DETECTED NOT DETECTED Final   Enterotoxigenic E coli (ETEC) NOT DETECTED  NOT DETECTED Final   Shiga like toxin producing E coli (STEC) NOT DETECTED NOT DETECTED Final   Shigella/Enteroinvasive E coli (EIEC) NOT DETECTED NOT DETECTED Final   Cryptosporidium NOT DETECTED NOT DETECTED Final   Cyclospora cayetanensis NOT DETECTED NOT DETECTED Final   Entamoeba histolytica NOT DETECTED NOT DETECTED Final   Giardia lamblia NOT DETECTED NOT DETECTED Final   Adenovirus F40/41 NOT DETECTED NOT DETECTED Final   Astrovirus NOT DETECTED NOT DETECTED Final   Norovirus GI/GII NOT DETECTED NOT DETECTED Final   Rotavirus A NOT DETECTED NOT DETECTED Final   Sapovirus (I, II, IV, and V) NOT DETECTED NOT DETECTED Final    Comment: Performed at [facility], [address] [place], [place] [address]  C Difficile Quick Screen w PCR reflex     Status: Abnormal   Collection Time: 06/05/24 10:18 PM   Specimen: Stool  Result Value Ref Range Status   C Diff antigen POSITIVE (A) NEGATIVE Final   C Diff toxin NEGATIVE NEGATIVE Final   C Diff interpretation Results are indeterminate. See PCR results.  Final    Comment: Performed at [facility], [address] [place], [place] [address]  C. Diff by PCR, Reflexed     Status: None   Collection Time: 06/05/24 10:18 PM  Result Value Ref Range Status   Toxigenic C. Difficile by PCR NEGATIVE NEGATIVE Final    Comment: Patient is colonized with non toxigenic C. difficile. May not need treatment unless significant symptoms are present.   Hypervirulent Strain PRESUMPTIVE NEGATIVE PRESUMPTIVE NEGATIVE Final    Comment: Performed at [facility],  [address], [place], [place] [address]    Radiology Studies: CT FEMUR RIGHT WO CONTRAST Result Date: 06/06/2024 CLINICAL DATA:  Right lower extremity pain. Concern for stress fracture. EXAM: CT OF THE LOWER RIGHT EXTREMITY WITHOUT CONTRAST TECHNIQUE: Multidetector CT imaging of the right lower extremity was performed according to the standard protocol. RADIATION DOSE REDUCTION: This exam was performed according to the departmental dose-optimization program which includes automated exposure control, adjustment of the mA and/or kV according to patient size and/or use of iterative reconstruction technique. COMPARISON:  Right hip radiograph dated 06/05/2024. FINDINGS: Bones/Joint/Cartilage No acute fracture or dislocation. The bones are osteopenic. Moderate arthritic changes of the right hip and right knee. No joint effusion. Ligaments Suboptimally assessed by CT. Muscles and Tendons No acute findings.  Fatty atrophy of the quadriceps muscles. Soft tissues No acute findings. A 2.6 x 1.6 cm probable Baker's cyst in the posterior knee. IMPRESSION: 1. No acute fracture or dislocation. 2. Moderate arthritic changes of the right hip and right knee. Electronically Signed   By: Vanetta Chou M.D.   On: 06/06/2024 10:07   US  Venous Img Lower Bilateral (DVT) Result Date: 06/06/2024 EXAM: ULTRASOUND DUPLEX OF THE BILATERAL LOWER EXTREMITY VEINS TECHNIQUE: Duplex ultrasound using B-mode/gray scaled imaging and Doppler spectral analysis and color flow was obtained of the deep venous structures of the bilateral lower extremity. COMPARISON: None available. CLINICAL HISTORY: Swelling. FINDINGS: LEFT: The common femoral vein, femoral vein, and popliteal vein demonstrate normal compressibility with normal color flow and spectral analysis. There is no evidence of deep venous thrombosis. The infrapopliteal veins are suboptimally demonstrated. RIGHT: The common femoral vein, femoral vein, and popliteal vein demonstrate  normal compressibility with normal color flow and spectral analysis. There is no evidence of deep venous thrombosis. The infrapopliteal veins are suboptimally demonstrated. IMPRESSION: 1. No evidence of DVT in the visualized veins. 2.  Infrapopliteal veins are suboptimally demonstrated bilaterally. Electronically signed by: Evalene Coho MD 06/06/2024 07:09 AM EST RP Workstation: [id number]   MR BRAIN WO CONTRAST Result Date: 06/06/2024 CLINICAL DATA:  Follow-up examination for stroke. EXAM: MRI HEAD WITHOUT CONTRAST TECHNIQUE: Multiplanar, multiecho pulse sequences of the brain and surrounding structures were obtained without intravenous contrast. COMPARISON:  CT from earlier the same day. FINDINGS: Brain: Cerebral volume within normal limits. Patchy and confluent T2/FLAIR hyperintensity involving the periventricular and deep white matter of both cerebral hemispheres, consistent with chronic small vessel ischemic disease, moderate in nature. Encephalomalacia and gliosis involving the right occipital lobe, consistent with a chronic infarct. No abnormal foci of restricted diffusion to suggest acute or subacute ischemia. Gray-white matter differentiation otherwise maintained. No acute or significant chronic intracranial blood products. No mass lesion, midline shift or mass effect. No hydrocephalus or extra-axial fluid collection. Pituitary gland within normal limits. Vascular: Major intracranial vascular flow voids are maintained. Skull and upper cervical spine: Craniocervical junction within normal limits. Bone marrow signal intensity normal. No scalp soft tissue abnormality. Sinuses/Orbits: Prior bilateral ocular lens replacement. Scattered mucosal thickening present throughout the sphenoid ethmoidal sinuses. No significant mastoid effusion. Other: None. IMPRESSION: 1. No acute intracranial abnormality. 2. Chronic right occipital infarct. 3. Underlying moderate chronic microvascular ischemic disease.  Electronically Signed   By: Morene Hoard M.D.   On: 06/06/2024 00:02   DG HIP UNILAT WITH PELVIS MIN 4 VIEWS RIGHT Result Date: 06/05/2024 CLINICAL DATA:  Hip pain. EXAM: DG HIP (WITH OR WITHOUT PELVIS) 4+V RIGHT COMPARISON:  Reformats from abdominopelvic CT earlier today FINDINGS: Technically limited due to difficulty with positioning. No evidence of acute fracture of the pelvis or right hip. Moderate right hip osteoarthritis. The pubic rami are intact. No pubic symphyseal or sacroiliac diastasis. Vascular calcifications are seen. IMPRESSION: 1. No acute fracture of the pelvis or right hip. 2. Moderate right hip osteoarthritis. Electronically Signed   By: Andrea Gasman M.D.   On: 06/05/2024 20:53   CT ABDOMEN PELVIS WO CONTRAST Result Date: 06/05/2024 CLINICAL DATA:  Trauma pain hypotension EXAM: CT ABDOMEN AND PELVIS WITHOUT CONTRAST TECHNIQUE: Multidetector CT imaging of the abdomen and pelvis was performed following the standard protocol without IV contrast. RADIATION DOSE REDUCTION: This exam was performed according to the departmental dose-optimization program which includes automated exposure control, adjustment of the mA and/or kV according to patient size and/or use of iterative reconstruction technique. COMPARISON:  CT 06/03/2024, 10/22/2022 FINDINGS: Lower chest: Lung bases demonstrate no acute airspace disease. Cardiomegaly with coronary vascular calcification. Hepatobiliary: No focal liver abnormality is seen. Status post cholecystectomy. No biliary dilatation. Pancreas: Unremarkable. No pancreatic ductal dilatation or surrounding inflammatory changes. Spleen: Normal in size without focal abnormality. Adrenals/Urinary Tract: Adrenal glands are normal. Kidneys show no hydronephrosis. Catheter in the bladder. Diffuse thick-walled appearance of bladder and some perivesical stranding. Small amount of gas in the urinary bladder. Stomach/Bowel: Stomach within normal limits. No dilated small  bowel. Diffuse diverticular disease of the colon. Possible areas of wall thickening involving the hepatic flexure and proximal transverse colon. Persistent rectal wall thickening and mild perirectal/presacral edema. Vascular/Lymphatic: Aortic atherosclerosis. No enlarged abdominal or pelvic lymph nodes. Reproductive: Uterus and bilateral adnexa are unremarkable. Other: No ascites or free air. Small fat containing umbilical hernia Musculoskeletal: Multilevel degenerative changes of the spine. Grade 1 anterolisthesis L4 on L5. No acute osseous abnormality. IMPRESSION: 1. Diffuse diverticular disease of the colon. Possible areas of wall thickening involving the hepatic flexure and proximal transverse colon, correlate  for colitis of infectious, inflammatory or ischemic etiology. Persistent rectal wall thickening and mild perirectal/presacral edema, suspicious for proctitis. 2. Diffuse thick-walled appearance of the bladder with perivesical stranding, correlate for cystitis. Aortic Atherosclerosis (ICD10-I70.0). Electronically Signed   By: Luke Bun M.D.   On: 06/05/2024 16:43   CT HEAD WO CONTRAST ( ) Result Date: 06/05/2024 CLINICAL DATA:  Head trauma EXAM: CT HEAD WITHOUT CONTRAST TECHNIQUE: Contiguous axial images were obtained from the base of the skull through the vertex without intravenous contrast. RADIATION DOSE REDUCTION: This exam was performed according to the departmental dose-optimization program which includes automated exposure control, adjustment of the mA and/or kV according to patient size and/or use of iterative reconstruction technique. COMPARISON:  CT brain 01/05/2024, MRI 10/19/2023 FINDINGS: Brain: No acute territorial infarction, hemorrhage or intracranial mass. Moderate white matter hypodensity consistent with chronic small vessel ischemic changes. The ventricles are nonenlarged. Vascular: No hyperdense vessels. Vertebral and carotid vascular calcification Skull: Normal. Negative for  fracture or focal lesion. Sinuses/Orbits: Mild mucosal thickening in the sinuses Other: None IMPRESSION: 1. No CT evidence for acute intracranial abnormality. 2. Moderate chronic small vessel ischemic changes of the white matter. Electronically Signed   By: Luke Bun M.D.   On: 06/05/2024 16:35      Liliyana Thobe T. Vielka Klinedinst [facility]  If 7PM-7AM, please contact night-coverage [email or web address] 06/06/2024, 11:38 AM   "

## 2024-06-06 NOTE — ED Notes (Signed)
 Patient turned and pivoted to bed side commode using walker. Patients linens were changed and new applied. Patient did need assistance with peri care.

## 2024-06-07 DIAGNOSIS — N184 Chronic kidney disease, stage 4 (severe): Secondary | ICD-10-CM | POA: Diagnosis not present

## 2024-06-07 DIAGNOSIS — E1159 Type 2 diabetes mellitus with other circulatory complications: Secondary | ICD-10-CM | POA: Diagnosis not present

## 2024-06-07 DIAGNOSIS — I739 Peripheral vascular disease, unspecified: Secondary | ICD-10-CM | POA: Diagnosis not present

## 2024-06-07 DIAGNOSIS — E1165 Type 2 diabetes mellitus with hyperglycemia: Secondary | ICD-10-CM | POA: Diagnosis not present

## 2024-06-07 DIAGNOSIS — I152 Hypertension secondary to endocrine disorders: Secondary | ICD-10-CM | POA: Diagnosis not present

## 2024-06-07 DIAGNOSIS — E66813 Obesity, class 3: Secondary | ICD-10-CM | POA: Diagnosis not present

## 2024-06-07 DIAGNOSIS — D72820 Lymphocytosis (symptomatic): Secondary | ICD-10-CM | POA: Diagnosis not present

## 2024-06-07 DIAGNOSIS — M79604 Pain in right leg: Secondary | ICD-10-CM | POA: Diagnosis not present

## 2024-06-07 DIAGNOSIS — Z794 Long term (current) use of insulin: Secondary | ICD-10-CM | POA: Diagnosis not present

## 2024-06-07 DIAGNOSIS — D120 Benign neoplasm of cecum: Secondary | ICD-10-CM | POA: Diagnosis not present

## 2024-06-07 DIAGNOSIS — K529 Noninfective gastroenteritis and colitis, unspecified: Secondary | ICD-10-CM | POA: Diagnosis not present

## 2024-06-07 DIAGNOSIS — Z8673 Personal history of transient ischemic attack (TIA), and cerebral infarction without residual deficits: Secondary | ICD-10-CM | POA: Diagnosis not present

## 2024-06-07 DIAGNOSIS — I5032 Chronic diastolic (congestive) heart failure: Secondary | ICD-10-CM | POA: Diagnosis not present

## 2024-06-07 LAB — RENAL FUNCTION PANEL
Albumin: 3.4 g/dL — ABNORMAL LOW (ref 3.5–5.0)
Anion gap: 9 (ref 5–15)
BUN: 27 mg/dL — ABNORMAL HIGH (ref 8–23)
CO2: 29 mmol/L (ref 22–32)
Calcium: 9.3 mg/dL (ref 8.9–10.3)
Chloride: 99 mmol/L (ref 98–111)
Creatinine, Ser: 1.42 mg/dL — ABNORMAL HIGH (ref 0.44–1.00)
GFR, Estimated: 36 mL/min — ABNORMAL LOW
Glucose, Bld: 221 mg/dL — ABNORMAL HIGH (ref 70–99)
Phosphorus: 2.4 mg/dL — ABNORMAL LOW (ref 2.5–4.6)
Potassium: 3.8 mmol/L (ref 3.5–5.1)
Sodium: 137 mmol/L (ref 135–145)

## 2024-06-07 LAB — CBC
HCT: 31.7 % — ABNORMAL LOW (ref 36.0–46.0)
Hemoglobin: 10.3 g/dL — ABNORMAL LOW (ref 12.0–15.0)
MCH: 29.2 pg (ref 26.0–34.0)
MCHC: 32.5 g/dL (ref 30.0–36.0)
MCV: 89.8 fL (ref 80.0–100.0)
Platelets: 307 K/uL (ref 150–400)
RBC: 3.53 MIL/uL — ABNORMAL LOW (ref 3.87–5.11)
RDW: 12.8 % (ref 11.5–15.5)
WBC: 7.1 K/uL (ref 4.0–10.5)
nRBC: 0 % (ref 0.0–0.2)

## 2024-06-07 LAB — GLUCOSE, CAPILLARY
Glucose-Capillary: 242 mg/dL — ABNORMAL HIGH (ref 70–99)
Glucose-Capillary: 218 mg/dL — ABNORMAL HIGH (ref 70–99)
Glucose-Capillary: 202 mg/dL — ABNORMAL HIGH (ref 70–99)
Glucose-Capillary: 224 mg/dL — ABNORMAL HIGH (ref 70–99)

## 2024-06-07 LAB — MAGNESIUM: Magnesium: 1.9 mg/dL (ref 1.7–2.4)

## 2024-06-07 MED ORDER — CARVEDILOL 6.25 MG PO TABS
12.5000 mg | ORAL_TABLET | Freq: Two times a day (BID) | ORAL | Status: DC
  Administered 2024-06-07 – 2024-06-14 (×13): 12.5 mg via ORAL
  Filled 2024-06-07 (×13): qty 2

## 2024-06-07 MED ORDER — TORSEMIDE 20 MG PO TABS
30.0000 mg | ORAL_TABLET | Freq: Every day | ORAL | Status: DC
  Administered 2024-06-07 – 2024-06-14 (×7): 30 mg via ORAL
  Filled 2024-06-07 (×7): qty 2

## 2024-06-07 MED ORDER — PEG 3350-KCL-NA BICARB-NACL 420 G PO SOLR
2000.0000 mL | Freq: Once | ORAL | Status: AC
  Administered 2024-06-08: 2000 mL via ORAL

## 2024-06-07 MED ORDER — CARVEDILOL 6.25 MG PO TABS
6.2500 mg | ORAL_TABLET | Freq: Once | ORAL | Status: AC
  Administered 2024-06-07: 6.25 mg via ORAL
  Filled 2024-06-07: qty 1

## 2024-06-07 MED ORDER — SODIUM CHLORIDE 0.9 % IV SOLN: INTRAVENOUS | Status: DC

## 2024-06-07 MED ORDER — PEG 3350-KCL-NA BICARB-NACL 420 G PO SOLR
2000.0000 mL | Freq: Once | ORAL | Status: AC
  Administered 2024-06-07: 2000 mL via ORAL
  Filled 2024-06-07: qty 4000

## 2024-06-07 MED ORDER — INSULIN ASPART 100 UNIT/ML IJ SOLN
5.0000 [IU] | Freq: Three times a day (TID) | INTRAMUSCULAR | Status: DC
  Administered 2024-06-08 – 2024-06-12 (×5): 5 [IU] via SUBCUTANEOUS
  Filled 2024-06-07 (×13): qty 5

## 2024-06-07 NOTE — Progress Notes (Addendum)
 " PROGRESS NOTE  Alexis Farmer FMW:[id number] DOB: 14-Aug-1940   PCP: Bair, Kalpana, MD  Patient is from: Home.  Lives with granddaughter.  Uses rolling walker for mobility  DOA: 06/05/2024 LOS: 1  Chief complaints Chief Complaint  Patient presents with   Abdominal Pain     Brief Narrative / Interim history: 83 year old F with PMH of HFpEF, PAH, CVA, CKD-4, DM-2, HTN, HLD, MGUS and morbid obesity presenting with abdominal pain, right leg/hip pain and weakness, and admitted with working diagnosis of colitis, possible cystitis, AKI and right leg weakness.   In ED, stable vitals.  Na 130. Cr 2.36 (b/l~1.7).  BUN 41.  CBC without significant finding.  Troponin 77.  Lactic acid 1.7.  UA with moderate Hgb and 6-10 RBC.  CT head without acute finding.  CT abdomen and pelvis raise concern for colitis, proctitis and cystitis.  Received fluid bolus and analgesics, and admitted for further care.  MRI brain, pelvic x-ray and lower extremity venous Doppler ordered.  GI consulted.  MRI brain without acute finding.  Pelvic x-ray and right femur CT showed moderate arthritis in hip and knee.  LLE venous Doppler negative for DVT.  On further interview and evaluation, does not seem to have GI symptoms other than chronic constipation.  GI planning colonoscopy.  Subjective: Seen and examined earlier this morning.  No major events overnight or this morning.  She is worried about her colon cancer after GI talked her about colonoscopy.  I reassured her that CT suggested more of inflammation in the colon cancer.  Still with right leg pain.  She rates her pain 6/10.  Assessment and plan: Right hip/leg pain/right leg weakness: Likely due to osteoarthritis as noted on pelvic x-ray and right femoral CT.  Reports more dependence on right leg due to left leg weakness after stroke.  Seems like she was referred to orthopedic surgery by PCP last month.  MRI brain without acute finding.  Does not look like  radiculopathic either.  LE Doppler negative for DVT.  Exam with diffuse tenderness in her right thigh and right hip. -Multimodal pain control with scheduled Tylenol , as needed oxycodone  and Dilaudid .  Discontinue morphine  -Continue home gabapentin  -Bowel regimen -PT/OT eval  Abnormal CT abdomen: Raise concern for colitis, proctitis and cystitis.  Not having GI symptoms other than chronic constipation.  Has Foley catheter for urinary tension.  UA not convincing for UTI.  C. difficile antigen positive toxin and PCR negative.  GIP negative as well -Plan for colonoscopy  AKI on CKD-4: b/l Cr ~1.7.  Likely prerenal and concurrent use of diuretics.  AKI resolved.  Excellent urine output. Recent Labs    10/20/23 2102 10/21/23 0651 10/22/23 0440 10/23/23 0329 11/15/23 1127 01/14/24 0943 06/03/24 0923 06/05/24 1424 06/06/24 0613 06/07/24 0549  BUN 35* 32* 28* 27* 23 31* 30* 41* 38* 27*  CREATININE 1.98* 1.79* 1.62* 1.51* 1.65* 1.77* 2.04* 2.36* 1.82* 1.42*  -Continue monitoring  IDDM-2 with hyperglycemia: A1c 8.8%.  She is on Novolin N and R at home.  Recent Labs  Lab 06/06/24 0739 06/06/24 1223 06/06/24 1646 06/06/24 2151 06/07/24 0842  GLUCAP 221* 159* 243* 221* 242*  -On Lantus  10 units daily.  -Continue SSI-moderate -Increase NovoLog  from 3 to 5 units 3 times daily with meals -Further adjustment as appropriate.  Chronic HFpEF/severe PAH: TTE in 09/2023 is LVEF of 60 to 65%, G1DD and RVSP of 80 mmHg.  No cardiopulmonary symptoms.  Appears euvolemic on exam but difficult due to body  habitus.  On torsemide  and Aldactone  at home - Resume home torsemide  - Continue holding Aldactone  -Closely monitor fluid and respiratory status while on IV fluid  Essential hypertension: SBP in 180s -Increase home Coreg  to 12.5 mg twice daily -Continue hydralazine  100 mg every 6 hours -Resume home torsemide  -Continue monitoring  History of CVA/PAD/hyperlipidemia -Continue home aspirin , Plavix   and statin.  Urinary retention: Had Foley catheter placed on 12/17 with UOP of 600 cc per chart review. - Will attempt voiding trial prior to discharge if mobility improves. - Manage constipation  Hypokalemia -Monitor replenish K and Mg as appropriate  Hyponatremia: Mild.  Improved. 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  She recommends continued observation as patient is overall asymptomatic.  Class II obesity Body mass index is 35.43 kg/m.          DVT prophylaxis:  heparin  injection 5,000 Units Start: 06/05/24 2200  Code Status: Full code Family Communication: None at the bedside Level of care: Med-Surg Status is: Observation Inpatient appropriate because: Right hip/leg pain, AKI and possible colitis   Final disposition: SNF   55 minutes with more than 50% spent in reviewing records, counseling patient/family and coordinating care.  Consultants:  Gastroenterology  Procedures: None  Microbiology summarized: GIP negative C. difficile antigen positive but toxin and PCR negative  Objective: Vitals:   06/06/24 1929 06/07/24 0101 06/07/24 0400 06/07/24 0807  BP: (!) 153/64 (!) 156/47 (!) 185/57 (!) 181/59  Pulse: 78 72 76 74  Resp: 18 18 18 13   Temp: 99.4 F (37.4 C) 100 F (37.8 C) 99 F (37.2 C) 98.2 F (36.8 C)  TempSrc: Oral Oral Oral Oral  SpO2: 97% 93% 99% 97%  Height:        Examination:  GENERAL: No apparent distress.  Nontoxic. HEENT: MMM.  Vision and hearing grossly intact.  NECK: Supple.  No apparent JVD.  RESP:  No IWOB.  Fair aeration bilaterally. CVS:  RRR. Heart sounds normal.  ABD/GI/GU: BS+. Abd soft, NTND.  Foley catheter in place. MSK/EXT:  Moves extremities.  Weakness in right leg mainly due to pain. No apparent swelling, or skin change. SKIN: no apparent skin lesion or wound NEURO: AA.  Oriented appropriately.  No apparent focal neuro deficit. PSYCH: Calm.  Normal affect.   Sch Meds:  Scheduled Meds:  acetaminophen   975 mg Oral Q6H WA   carvedilol   6.25 mg Oral BID WC   Chlorhexidine  Gluconate Cloth  6 each Topical Daily   clopidogrel   75 mg Oral Daily   heparin   5,000 Units Subcutaneous Q8H   hydrALAZINE   100 mg Oral Q6H   Influenza vac split trivalent PF  0.5 mL Intramuscular Tomorrow-1000   insulin  aspart  0-15 Units Subcutaneous TID WC   insulin  aspart  0-5 Units Subcutaneous QHS   insulin  aspart  5 Units Subcutaneous TID WC   insulin  glargine  10 Units Subcutaneous Daily   polyethylene glycol-electrolytes  2,000 mL Oral Once   Followed by   NOREEN ON 06/08/2024] polyethylene glycol-electrolytes  2,000 mL Oral Once   rosuvastatin   20 mg Oral Daily   senna-docusate  2 tablet Oral BID   sodium chloride  flush  3 mL Intravenous Q12H   Continuous Infusions:  sodium chloride  Stopped (06/07/24 0957)   PRN Meds:.HYDROmorphone  (DILAUDID ) injection, ondansetron  **OR** ondansetron  (ZOFRAN ) IV, oxyCODONE   Antimicrobials: Anti-infectives (From admission, onward)    None        I  have personally reviewed the following labs and images: CBC: Recent Labs  Lab 06/03/24 0923 06/05/24 1424 06/06/24 0613 06/07/24 0549  WBC 8.6 10.3 8.1 7.1  NEUTROABS  --  4.7  --   --   HGB 11.1* 11.7* 9.6* 10.3*  HCT 33.9* 37.2 29.9* 31.7*  MCV 88.1 90.7 89.8 89.8  PLT 322 180 277 307   BMP &GFR Recent Labs  Lab 06/03/24 0923 06/05/24 1424 06/06/24 0613 06/07/24 0549  NA 129* 130* 134* 137  K 4.3 3.6 3.3* 3.8  CL 90* 88* 94* 99  CO2 28 30 30 29   GLUCOSE 156* 226* 245* 221*  BUN 30* 41* 38* 27*  CREATININE 2.04* 2.36* 1.82* 1.42*  CALCIUM  9.8 9.9 9.0 9.3  MG  --  2.6* 2.2  2.1 1.9  PHOS  --   --   --  2.4*   CrCl cannot be calculated (Unknown ideal weight.). Liver & Pancreas: Recent Labs  Lab 06/03/24 0923 06/05/24 1424 06/07/24 0549  AST 29 23  --   ALT 15 14  --   ALKPHOS 45 51  --   BILITOT 0.5 1.3*  --   PROT 7.4 7.4   --   ALBUMIN 4.0 4.1 3.4*   Recent Labs  Lab 06/05/24 1424  LIPASE 22   No results for input(s): AMMONIA in the last 168 hours. Diabetic: Recent Labs    06/05/24 1424  HGBA1C 8.8*   Recent Labs  Lab 06/06/24 0739 06/06/24 1223 06/06/24 1646 06/06/24 2151 06/07/24 0842  GLUCAP 221* 159* 243* 221* 242*   Cardiac Enzymes: Recent Labs  Lab 06/06/24 0613  CKTOTAL 203   No results for input(s): PROBNP in the last 8760 hours. Coagulation Profile: Recent Labs  Lab 06/05/24 1424  INR 1.0   Thyroid  Function Tests: Recent Labs    06/06/24 0613  TSH 1.890   Lipid Profile: No results for input(s): CHOL, HDL, LDLCALC, TRIG, CHOLHDL, LDLDIRECT in the last 72 hours. Anemia Panel: Recent Labs    06/06/24 0613 06/06/24 1543  VITAMINB12  --  818  FOLATE  --  >20.0  FERRITIN 410*  --   TIBC 207*  --   IRON 26*  --   RETICCTPCT 2.5  --    Urine analysis:    Component Value Date/Time   COLORURINE STRAW (A) 06/05/2024 1448   APPEARANCEUR CLEAR (A) 06/05/2024 1448   APPEARANCEUR Clear 10/22/2018 0844   LABSPEC 1.004 (L) 06/05/2024 1448   PHURINE 5.0 06/05/2024 1448   GLUCOSEU NEGATIVE 06/05/2024 1448   HGBUR MODERATE (A) 06/05/2024 1448   BILIRUBINUR NEGATIVE 06/05/2024 1448   BILIRUBINUR neg 03/17/2024 1449   BILIRUBINUR Negative 10/22/2018 0844   KETONESUR NEGATIVE 06/05/2024 1448   PROTEINUR 30 (A) 06/05/2024 1448   UROBILINOGEN 0.2 03/17/2024 1449   UROBILINOGEN 0.2 03/09/2020 1022   NITRITE NEGATIVE 06/05/2024 1448   LEUKOCYTESUR NEGATIVE 06/05/2024 1448   Sepsis Labs: Invalid input(s): PROCALCITONIN, LACTICIDVEN  Microbiology: Recent Results (from the past 240 hours)  Gastrointestinal Panel by PCR , Stool     Status: None   Collection Time: 06/05/24 10:18 PM   Specimen: Stool  Result Value Ref Range Status   Campylobacter species NOT DETECTED NOT DETECTED Final   Plesimonas shigelloides NOT DETECTED NOT DETECTED Final    Salmonella species NOT DETECTED NOT DETECTED Final   Yersinia enterocolitica NOT DETECTED NOT DETECTED Final   Vibrio species NOT DETECTED NOT DETECTED Final   Vibrio cholerae NOT DETECTED NOT DETECTED Final  Enteroaggregative E coli (EAEC) NOT DETECTED NOT DETECTED Final   Enteropathogenic E coli (EPEC) NOT DETECTED NOT DETECTED Final   Enterotoxigenic E coli (ETEC) NOT DETECTED NOT DETECTED Final   Shiga like toxin producing E coli (STEC) NOT DETECTED NOT DETECTED Final   Shigella/Enteroinvasive E coli (EIEC) NOT DETECTED NOT DETECTED Final   Cryptosporidium NOT DETECTED NOT DETECTED Final   Cyclospora cayetanensis NOT DETECTED NOT DETECTED Final   Entamoeba histolytica NOT DETECTED NOT DETECTED Final   Giardia lamblia NOT DETECTED NOT DETECTED Final   Adenovirus F40/41 NOT DETECTED NOT DETECTED Final   Astrovirus NOT DETECTED NOT DETECTED Final   Norovirus GI/GII NOT DETECTED NOT DETECTED Final   Rotavirus A NOT DETECTED NOT DETECTED Final   Sapovirus (I, II, IV, and V) NOT DETECTED NOT DETECTED Final    Comment: Performed at [facility], [address] [place], [place] [address]  C Difficile Quick Screen w PCR reflex     Status: Abnormal   Collection Time: 06/05/24 10:18 PM   Specimen: Stool  Result Value Ref Range Status   C Diff antigen POSITIVE (A) NEGATIVE Final   C Diff toxin NEGATIVE NEGATIVE Final   C Diff interpretation Results are indeterminate. See PCR results.  Final    Comment: Performed at [facility], [address] [place], [place] [address]  C. Diff by PCR, Reflexed     Status: None   Collection Time: 06/05/24 10:18 PM  Result Value Ref Range Status   Toxigenic C. Difficile by PCR NEGATIVE NEGATIVE Final    Comment: Patient is colonized with non toxigenic C. difficile. May not need treatment unless significant symptoms are present.   Hypervirulent Strain PRESUMPTIVE NEGATIVE PRESUMPTIVE NEGATIVE Final    Comment: Performed at [facility], [address] [place], [place] [address]    Radiology Studies: No results found.     Joselyn Edling T. Shailee Foots [facility]  If 7PM-7AM, please contact night-coverage [email or web address] 06/07/2024, 12:04 PM   "

## 2024-06-07 NOTE — Plan of Care (Signed)
" °  Problem: Coping: Goal: Ability to adjust to condition or change in health will improve Outcome: Not Progressing   Problem: Fluid Volume: Goal: Ability to maintain a balanced intake and output will improve Outcome: Not Progressing   Problem: Health Behavior/Discharge Planning: Goal: Ability to identify and utilize available resources and services will improve Outcome: Progressing   Problem: Metabolic: Goal: Ability to maintain appropriate glucose levels will improve Outcome: Not Progressing   Problem: Nutritional: Goal: Maintenance of adequate nutrition will improve Outcome: Not Progressing   "

## 2024-06-07 NOTE — Care Management CC44 (Signed)
"         Condition Code 44 Documentation Completed  Patient Details  Name: Alexis Farmer MRN: [id number] Date of Birth: 1940-08-25   Condition Code 44 given:  Yes Patient signature on Condition Code 44 notice:  Yes Documentation of 2 MD's agreement:  Yes Code 44 added to claim:  Yes    Kaylon Laroche L Ramisa Duman, LCSW 06/07/2024, 1:57 PM  "

## 2024-06-07 NOTE — Progress Notes (Signed)
 Patient refuses evening dose of insulin , states she does not need it she is not eating despite teaching.

## 2024-06-07 NOTE — Progress Notes (Signed)
 "  Ruel Kung , MD [address], [place], [place], [address] Phone: [phone] Fax: [phone]   Alexis Farmer is being followed for colitis  Day 1 of follow up   Subjective: Denies any abdominal pain . Not had a bowel movement , last bowel movement for many days back    Objective: Vital signs in last 24 hours: Vitals:   06/06/24 1929 06/07/24 0101 06/07/24 0400 06/07/24 0807  BP: (!) 153/64 (!) 156/47 (!) 185/57 (!) 181/59  Pulse: 78 72 76 74  Resp: 18 18 18 13   Temp: 99.4 F (37.4 C) 100 F (37.8 C) 99 F (37.2 C) 98.2 F (36.8 C)  TempSrc: Oral Oral Oral Oral  SpO2: 97% 93% 99% 97%  Height:       Weight change:   Intake/Output Summary (Last 24 hours) at 06/07/2024 0850 Last data filed at 06/06/2024 1900 Gross per 24 hour  Intake 85.06 ml  Output 1750 ml  Net -1664.94 ml     Exam: Heart:: S1S2 present Lungs: normal Abdomen: soft, nontender, normal bowel sounds   Lab Results: @LABTEST2 @ Micro Results: Recent Results (from the past 240 hours)  Gastrointestinal Panel by PCR , Stool     Status: None   Collection Time: 06/05/24 10:18 PM   Specimen: Stool  Result Value Ref Range Status   Campylobacter species NOT DETECTED NOT DETECTED Final   Plesimonas shigelloides NOT DETECTED NOT DETECTED Final   Salmonella species NOT DETECTED NOT DETECTED Final   Yersinia enterocolitica NOT DETECTED NOT DETECTED Final   Vibrio species NOT DETECTED NOT DETECTED Final   Vibrio cholerae NOT DETECTED NOT DETECTED Final   Enteroaggregative E coli (EAEC) NOT DETECTED NOT DETECTED Final   Enteropathogenic E coli (EPEC) NOT DETECTED NOT DETECTED Final   Enterotoxigenic E coli (ETEC) NOT DETECTED NOT DETECTED Final   Shiga like toxin producing E coli (STEC) NOT DETECTED NOT DETECTED Final   Shigella/Enteroinvasive E coli (EIEC) NOT DETECTED NOT DETECTED Final   Cryptosporidium NOT DETECTED NOT DETECTED Final   Cyclospora cayetanensis NOT DETECTED NOT  DETECTED Final   Entamoeba histolytica NOT DETECTED NOT DETECTED Final   Giardia lamblia NOT DETECTED NOT DETECTED Final   Adenovirus F40/41 NOT DETECTED NOT DETECTED Final   Astrovirus NOT DETECTED NOT DETECTED Final   Norovirus GI/GII NOT DETECTED NOT DETECTED Final   Rotavirus A NOT DETECTED NOT DETECTED Final   Sapovirus (I, II, IV, and V) NOT DETECTED NOT DETECTED Final    Comment: Performed at [facility], [address] [place], [place] [address]  C Difficile Quick Screen w PCR reflex     Status: Abnormal   Collection Time: 06/05/24 10:18 PM   Specimen: Stool  Result Value Ref Range Status   C Diff antigen POSITIVE (A) NEGATIVE Final   C Diff toxin NEGATIVE NEGATIVE Final   C Diff interpretation Results are indeterminate. See PCR results.  Final    Comment: Performed at [facility], [address] [place], [place] [address]  C. Diff by PCR, Reflexed     Status: None   Collection Time: 06/05/24 10:18 PM  Result Value Ref Range Status   Toxigenic C. Difficile by PCR NEGATIVE NEGATIVE Final    Comment: Patient is colonized with non toxigenic C. difficile. May not need treatment unless significant symptoms are present.   Hypervirulent Strain PRESUMPTIVE NEGATIVE PRESUMPTIVE NEGATIVE Final    Comment: Performed at [facility], [address], [place], [place]  [address]   Studies/Results: CT FEMUR RIGHT WO CONTRAST Result Date: 06/06/2024 CLINICAL DATA:  Right lower extremity pain. Concern for stress fracture. EXAM: CT OF THE LOWER RIGHT EXTREMITY WITHOUT CONTRAST TECHNIQUE: Multidetector CT imaging of the right lower extremity was performed according to the standard protocol. RADIATION DOSE REDUCTION: This exam was performed according to the departmental dose-optimization program which includes automated exposure control, adjustment of the mA and/or kV according to patient size and/or use of iterative reconstruction technique. COMPARISON:  Right hip  radiograph dated 06/05/2024. FINDINGS: Bones/Joint/Cartilage No acute fracture or dislocation. The bones are osteopenic. Moderate arthritic changes of the right hip and right knee. No joint effusion. Ligaments Suboptimally assessed by CT. Muscles and Tendons No acute findings.  Fatty atrophy of the quadriceps muscles. Soft tissues No acute findings. A 2.6 x 1.6 cm probable Baker's cyst in the posterior knee. IMPRESSION: 1. No acute fracture or dislocation. 2. Moderate arthritic changes of the right hip and right knee. Electronically Signed   By: Vanetta Chou M.D.   On: 06/06/2024 10:07   US  Venous Img Lower Bilateral (DVT) Result Date: 06/06/2024 EXAM: ULTRASOUND DUPLEX OF THE BILATERAL LOWER EXTREMITY VEINS TECHNIQUE: Duplex ultrasound using B-mode/gray scaled imaging and Doppler spectral analysis and color flow was obtained of the deep venous structures of the bilateral lower extremity. COMPARISON: None available. CLINICAL HISTORY: Swelling. FINDINGS: LEFT: The common femoral vein, femoral vein, and popliteal vein demonstrate normal compressibility with normal color flow and spectral analysis. There is no evidence of deep venous thrombosis. The infrapopliteal veins are suboptimally demonstrated. RIGHT: The common femoral vein, femoral vein, and popliteal vein demonstrate normal compressibility with normal color flow and spectral analysis. There is no evidence of deep venous thrombosis. The infrapopliteal veins are suboptimally demonstrated. IMPRESSION: 1. No evidence of DVT in the visualized veins. 2. Infrapopliteal veins are suboptimally demonstrated bilaterally. Electronically signed by: Evalene Coho MD 06/06/2024 07:09 AM EST RP Workstation: [id number]   MR BRAIN WO CONTRAST Result Date: 06/06/2024 CLINICAL DATA:  Follow-up examination for stroke. EXAM: MRI HEAD WITHOUT CONTRAST TECHNIQUE: Multiplanar, multiecho pulse sequences of the brain and surrounding structures were obtained without  intravenous contrast. COMPARISON:  CT from earlier the same day. FINDINGS: Brain: Cerebral volume within normal limits. Patchy and confluent T2/FLAIR hyperintensity involving the periventricular and deep white matter of both cerebral hemispheres, consistent with chronic small vessel ischemic disease, moderate in nature. Encephalomalacia and gliosis involving the right occipital lobe, consistent with a chronic infarct. No abnormal foci of restricted diffusion to suggest acute or subacute ischemia. Gray-white matter differentiation otherwise maintained. No acute or significant chronic intracranial blood products. No mass lesion, midline shift or mass effect. No hydrocephalus or extra-axial fluid collection. Pituitary gland within normal limits. Vascular: Major intracranial vascular flow voids are maintained. Skull and upper cervical spine: Craniocervical junction within normal limits. Bone marrow signal intensity normal. No scalp soft tissue abnormality. Sinuses/Orbits: Prior bilateral ocular lens replacement. Scattered mucosal thickening present throughout the sphenoid ethmoidal sinuses. No significant mastoid effusion. Other: None. IMPRESSION: 1. No acute intracranial abnormality. 2. Chronic right occipital infarct. 3. Underlying moderate chronic microvascular ischemic disease. Electronically Signed   By: Morene Hoard M.D.   On: 06/06/2024 00:02   DG HIP UNILAT WITH PELVIS MIN 4 VIEWS RIGHT Result Date: 06/05/2024 CLINICAL DATA:  Hip pain. EXAM: DG HIP (WITH OR WITHOUT PELVIS) 4+V RIGHT COMPARISON:  Reformats from abdominopelvic CT earlier today FINDINGS: Technically limited due to difficulty with positioning. No evidence of acute fracture of the  pelvis or right hip. Moderate right hip osteoarthritis. The pubic rami are intact. No pubic symphyseal or sacroiliac diastasis. Vascular calcifications are seen. IMPRESSION: 1. No acute fracture of the pelvis or right hip. 2. Moderate right hip osteoarthritis.  Electronically Signed   By: Andrea Gasman M.D.   On: 06/05/2024 20:53   CT ABDOMEN PELVIS WO CONTRAST Result Date: 06/05/2024 CLINICAL DATA:  Trauma pain hypotension EXAM: CT ABDOMEN AND PELVIS WITHOUT CONTRAST TECHNIQUE: Multidetector CT imaging of the abdomen and pelvis was performed following the standard protocol without IV contrast. RADIATION DOSE REDUCTION: This exam was performed according to the departmental dose-optimization program which includes automated exposure control, adjustment of the mA and/or kV according to patient size and/or use of iterative reconstruction technique. COMPARISON:  CT 06/03/2024, 10/22/2022 FINDINGS: Lower chest: Lung bases demonstrate no acute airspace disease. Cardiomegaly with coronary vascular calcification. Hepatobiliary: No focal liver abnormality is seen. Status post cholecystectomy. No biliary dilatation. Pancreas: Unremarkable. No pancreatic ductal dilatation or surrounding inflammatory changes. Spleen: Normal in size without focal abnormality. Adrenals/Urinary Tract: Adrenal glands are normal. Kidneys show no hydronephrosis. Catheter in the bladder. Diffuse thick-walled appearance of bladder and some perivesical stranding. Small amount of gas in the urinary bladder. Stomach/Bowel: Stomach within normal limits. No dilated small bowel. Diffuse diverticular disease of the colon. Possible areas of wall thickening involving the hepatic flexure and proximal transverse colon. Persistent rectal wall thickening and mild perirectal/presacral edema. Vascular/Lymphatic: Aortic atherosclerosis. No enlarged abdominal or pelvic lymph nodes. Reproductive: Uterus and bilateral adnexa are unremarkable. Other: No ascites or free air. Small fat containing umbilical hernia Musculoskeletal: Multilevel degenerative changes of the spine. Grade 1 anterolisthesis L4 on L5. No acute osseous abnormality. IMPRESSION: 1. Diffuse diverticular disease of the colon. Possible areas of wall  thickening involving the hepatic flexure and proximal transverse colon, correlate for colitis of infectious, inflammatory or ischemic etiology. Persistent rectal wall thickening and mild perirectal/presacral edema, suspicious for proctitis. 2. Diffuse thick-walled appearance of the bladder with perivesical stranding, correlate for cystitis. Aortic Atherosclerosis (ICD10-I70.0). Electronically Signed   By: Luke Bun M.D.   On: 06/05/2024 16:43   CT HEAD WO CONTRAST ( ) Result Date: 06/05/2024 CLINICAL DATA:  Head trauma EXAM: CT HEAD WITHOUT CONTRAST TECHNIQUE: Contiguous axial images were obtained from the base of the skull through the vertex without intravenous contrast. RADIATION DOSE REDUCTION: This exam was performed according to the departmental dose-optimization program which includes automated exposure control, adjustment of the mA and/or kV according to patient size and/or use of iterative reconstruction technique. COMPARISON:  CT brain 01/05/2024, MRI 10/19/2023 FINDINGS: Brain: No acute territorial infarction, hemorrhage or intracranial mass. Moderate white matter hypodensity consistent with chronic small vessel ischemic changes. The ventricles are nonenlarged. Vascular: No hyperdense vessels. Vertebral and carotid vascular calcification Skull: Normal. Negative for fracture or focal lesion. Sinuses/Orbits: Mild mucosal thickening in the sinuses Other: None IMPRESSION: 1. No CT evidence for acute intracranial abnormality. 2. Moderate chronic small vessel ischemic changes of the white matter. Electronically Signed   By: Luke Bun M.D.   On: 06/05/2024 16:35   Medications: I have reviewed the patient's current medications. Scheduled Meds:  acetaminophen   975 mg Oral Q6H WA   carvedilol   6.25 mg Oral BID WC   Chlorhexidine  Gluconate Cloth  6 each Topical Daily   clopidogrel   75 mg Oral Daily   heparin   5,000 Units Subcutaneous Q8H   hydrALAZINE   100 mg Oral Q6H   Influenza vac split  trivalent PF  0.5  mL Intramuscular Tomorrow-1000   insulin  aspart  0-15 Units Subcutaneous TID WC   insulin  aspart  0-5 Units Subcutaneous QHS   insulin  aspart  3 Units Subcutaneous TID WC   insulin  glargine  10 Units Subcutaneous Daily   rosuvastatin   20 mg Oral Daily   senna-docusate  2 tablet Oral BID   sodium chloride  flush  3 mL Intravenous Q12H   Continuous Infusions: PRN Meds:.HYDROmorphone  (DILAUDID ) injection, ondansetron  **OR** ondansetron  (ZOFRAN ) IV, oxyCODONE    Assessment: Principal Problem:   Colitis Active Problems:   Uncontrolled type 2 diabetes mellitus with hyperglycemia, with long-term current use of insulin  (HCC)   CKD (chronic kidney disease) stage 4, GFR 15-29 ml/min (HCC)   Chronic diastolic CHF (congestive heart failure) (HCC)   PAD (peripheral artery disease)   Obesity, Class III, BMI 40-49.9 (morbid obesity) (HCC)   Hypertension associated with diabetes (HCC)   Monoclonal B-cell lymphocytosis of undetermined significance   History of CVA (cerebrovascular accident)   Right leg pain   Alexis Farmer is a 83 y.o. y/o female with history of CKD, stroke, hypertension hyperlipidemia diabetes presents emergency room with abdominal discomfort CT scan of the abdomen shows diffuse diverticular disease of colon and possible wall thickening of the hepatic flexure proximal transverse colon and suggest to correlate for colitis or inflammatory or ischemic.  Concerns also for cystitis.  The location of the inflammation in the colon is not typical for ischemic colitis.  Differentials would include stercoral ulcer with proctitis from constipation versus inflammatory bowel disease.Hb 9.6---->10.3, cr down to 1.42 from 1.8   Plan 1. Aim for colonoscopy Monday or Tuesday depending on results of bowel prep - I will put in order for a colonoscopy for tomorrow with bowel prep starting this evening - my suspicion is that she may not be ready for tomorrow as she has not had a bowel  movement in many days   I have discussed alternative options, risks & benefits,  which include, but are not limited to, bleeding, infection, perforation,respiratory complication & drug reaction.  The patient agrees with this plan & written consent will be obtained.       LOS: 1 day   Ruel Kung, MD 06/07/2024, 8:50 AM  "

## 2024-06-07 NOTE — Progress Notes (Signed)
 Patient refusing her SSI for CBG 218 this afternoon. RN educated patient on need for insulin  at this time patient continues to refuse stating she has not eat and does not need it. Patient also refuses her subcu heparin  for DVT prophylaxis stating that she does not need it RN also educated patient about the risks of not taking this heparin , patient refuses.

## 2024-06-07 NOTE — Care Management Obs Status (Signed)
 MEDICARE OBSERVATION STATUS NOTIFICATION   Patient Details  Name: Alexis Farmer MRN: [id number] Date of Birth: 1941-01-04   Medicare Observation Status Notification Given:  Yes    Rojelio SHAUNNA Rattler 06/07/2024, 12:29 PM

## 2024-06-08 ENCOUNTER — Encounter: Payer: Self-pay | Admitting: Student

## 2024-06-08 ENCOUNTER — Encounter
Admission: EM | Disposition: A | Discharge status: Home / Self Care | Payer: Self-pay | Source: Home / Self Care | Attending: Emergency Medicine

## 2024-06-08 ENCOUNTER — Observation Stay

## 2024-06-08 DIAGNOSIS — M79604 Pain in right leg: Secondary | ICD-10-CM | POA: Diagnosis not present

## 2024-06-08 DIAGNOSIS — K633 Ulcer of intestine: Secondary | ICD-10-CM | POA: Diagnosis not present

## 2024-06-08 DIAGNOSIS — I739 Peripheral vascular disease, unspecified: Secondary | ICD-10-CM | POA: Diagnosis not present

## 2024-06-08 DIAGNOSIS — I5032 Chronic diastolic (congestive) heart failure: Secondary | ICD-10-CM | POA: Diagnosis not present

## 2024-06-08 DIAGNOSIS — N184 Chronic kidney disease, stage 4 (severe): Secondary | ICD-10-CM | POA: Diagnosis not present

## 2024-06-08 DIAGNOSIS — K641 Second degree hemorrhoids: Secondary | ICD-10-CM | POA: Diagnosis not present

## 2024-06-08 DIAGNOSIS — K635 Polyp of colon: Secondary | ICD-10-CM

## 2024-06-08 DIAGNOSIS — K5730 Diverticulosis of large intestine without perforation or abscess without bleeding: Secondary | ICD-10-CM | POA: Diagnosis not present

## 2024-06-08 DIAGNOSIS — D72820 Lymphocytosis (symptomatic): Secondary | ICD-10-CM | POA: Diagnosis not present

## 2024-06-08 DIAGNOSIS — D120 Benign neoplasm of cecum: Secondary | ICD-10-CM | POA: Diagnosis not present

## 2024-06-08 DIAGNOSIS — E1159 Type 2 diabetes mellitus with other circulatory complications: Secondary | ICD-10-CM | POA: Diagnosis not present

## 2024-06-08 DIAGNOSIS — I152 Hypertension secondary to endocrine disorders: Secondary | ICD-10-CM | POA: Diagnosis not present

## 2024-06-08 DIAGNOSIS — Z8673 Personal history of transient ischemic attack (TIA), and cerebral infarction without residual deficits: Secondary | ICD-10-CM | POA: Diagnosis not present

## 2024-06-08 DIAGNOSIS — E66813 Obesity, class 3: Secondary | ICD-10-CM | POA: Diagnosis not present

## 2024-06-08 DIAGNOSIS — E1165 Type 2 diabetes mellitus with hyperglycemia: Secondary | ICD-10-CM | POA: Diagnosis not present

## 2024-06-08 DIAGNOSIS — Z794 Long term (current) use of insulin: Secondary | ICD-10-CM | POA: Diagnosis not present

## 2024-06-08 DIAGNOSIS — K529 Noninfective gastroenteritis and colitis, unspecified: Secondary | ICD-10-CM | POA: Diagnosis not present

## 2024-06-08 HISTORY — PX: POLYPECTOMY: SHX149

## 2024-06-08 HISTORY — PX: COLONOSCOPY: SHX5424

## 2024-06-08 LAB — GLUCOSE, CAPILLARY
Glucose-Capillary: 194 mg/dL — ABNORMAL HIGH (ref 70–99)
Glucose-Capillary: 223 mg/dL — ABNORMAL HIGH (ref 70–99)
Glucose-Capillary: 132 mg/dL — ABNORMAL HIGH (ref 70–99)
Glucose-Capillary: 126 mg/dL — ABNORMAL HIGH (ref 70–99)
Glucose-Capillary: 173 mg/dL — ABNORMAL HIGH (ref 70–99)

## 2024-06-08 LAB — CBC
HCT: 33.6 % — ABNORMAL LOW (ref 36.0–46.0)
Hemoglobin: 11.1 g/dL — ABNORMAL LOW (ref 12.0–15.0)
MCH: 29.4 pg (ref 26.0–34.0)
MCHC: 33 g/dL (ref 30.0–36.0)
MCV: 89.1 fL (ref 80.0–100.0)
Platelets: 320 K/uL (ref 150–400)
RBC: 3.77 MIL/uL — ABNORMAL LOW (ref 3.87–5.11)
RDW: 12.6 % (ref 11.5–15.5)
WBC: 7.3 K/uL (ref 4.0–10.5)
nRBC: 0 % (ref 0.0–0.2)

## 2024-06-08 LAB — COMPREHENSIVE METABOLIC PANEL WITH GFR
ALT: 13 U/L (ref 0–44)
AST: 25 U/L (ref 15–41)
Albumin: 3.6 g/dL (ref 3.5–5.0)
Alkaline Phosphatase: 44 U/L (ref 38–126)
Anion gap: 12 (ref 5–15)
BUN: 19 mg/dL (ref 8–23)
CO2: 28 mmol/L (ref 22–32)
Calcium: 9.5 mg/dL (ref 8.9–10.3)
Chloride: 99 mmol/L (ref 98–111)
Creatinine, Ser: 1.22 mg/dL — ABNORMAL HIGH (ref 0.44–1.00)
GFR, Estimated: 43 mL/min — ABNORMAL LOW
Glucose, Bld: 210 mg/dL — ABNORMAL HIGH (ref 70–99)
Potassium: 3.6 mmol/L (ref 3.5–5.1)
Sodium: 139 mmol/L (ref 135–145)
Total Bilirubin: 0.6 mg/dL (ref 0.0–1.2)
Total Protein: 6.7 g/dL (ref 6.5–8.1)

## 2024-06-08 LAB — MAGNESIUM: Magnesium: 1.7 mg/dL (ref 1.7–2.4)

## 2024-06-08 LAB — C-REACTIVE PROTEIN: CRP: <3 mg/dL — ABNORMAL HIGH

## 2024-06-08 LAB — SEDIMENTATION RATE: Sed Rate: 43 mm/h — ABNORMAL HIGH (ref 0–30)

## 2024-06-08 MED ORDER — SPIRONOLACTONE 25 MG PO TABS
25.0000 mg | ORAL_TABLET | ORAL | Status: DC
  Administered 2024-06-08 – 2024-06-12 (×2): 25 mg via ORAL
  Filled 2024-06-08 (×2): qty 1

## 2024-06-08 MED ORDER — AMLODIPINE BESYLATE 5 MG PO TABS
5.0000 mg | ORAL_TABLET | Freq: Every day | ORAL | Status: DC
  Administered 2024-06-08 – 2024-06-09 (×2): 5 mg via ORAL
  Filled 2024-06-08 (×2): qty 1

## 2024-06-08 MED ORDER — PROPOFOL 10 MG/ML IV BOLUS
INTRAVENOUS | Status: DC | PRN
  Administered 2024-06-08: 100 ug/kg/min via INTRAVENOUS
  Administered 2024-06-08: 50 mg via INTRAVENOUS

## 2024-06-08 MED ORDER — PHENYLEPHRINE HCL (PRESSORS) 10 MG/ML IV SOLN
INTRAVENOUS | Status: DC | PRN
  Administered 2024-06-08 (×2): 100 ug via INTRAVENOUS

## 2024-06-08 MED ORDER — SODIUM CHLORIDE 0.9 % IV SOLN: INTRAVENOUS | Status: DC

## 2024-06-08 MED ORDER — CARVEDILOL 12.5 MG PO TABS: 12.5000 mg | ORAL_TABLET | Freq: Two times a day (BID) | ORAL | Status: AC

## 2024-06-08 MED ORDER — ETOMIDATE 2 MG/ML IV SOLN
INTRAVENOUS | Status: DC | PRN
  Administered 2024-06-08: 4 mg via INTRAVENOUS

## 2024-06-08 MED ORDER — FLUTICASONE PROPIONATE 50 MCG/ACT NA SUSP
1.0000 | Freq: Every day | NASAL | Status: DC
  Administered 2024-06-08 – 2024-06-14 (×7): 1 via NASAL
  Filled 2024-06-08 (×2): qty 16

## 2024-06-08 MED ORDER — HYDRALAZINE HCL 100 MG PO TABS: 100.0000 mg | ORAL_TABLET | Freq: Three times a day (TID) | ORAL | Status: AC

## 2024-06-08 MED ORDER — LIDOCAINE HCL (CARDIAC) PF 100 MG/5ML IV SOSY
PREFILLED_SYRINGE | INTRAVENOUS | Status: DC | PRN
  Administered 2024-06-08: 100 mg via INTRAVENOUS

## 2024-06-08 MED ORDER — HYDRALAZINE HCL 50 MG PO TABS
100.0000 mg | ORAL_TABLET | Freq: Three times a day (TID) | ORAL | Status: DC
  Administered 2024-06-09 – 2024-06-14 (×9): 100 mg via ORAL
  Filled 2024-06-08 (×16): qty 2

## 2024-06-08 MED ORDER — INSULIN ASPART 100 UNIT/ML IJ SOLN: 0.0000 [IU] | Freq: Three times a day (TID) | INTRAMUSCULAR | Status: DC

## 2024-06-08 MED ORDER — INSULIN GLARGINE 100 UNIT/ML ~~LOC~~ SOLN: 15.0000 [IU] | Freq: Every day | SUBCUTANEOUS | Status: AC

## 2024-06-08 NOTE — Progress Notes (Signed)
 The patient had a colonoscopy today with a single linear ulceration of the [facility] flexure which may represent a small area of ischemia.  The rest of the colon showed diverticulosis without any sign of bleeding or inflammation.  There was no signs of any pseudomembranes or inflammation anywhere else in the colon.  I am not recommending any further GI procedures for this patient.  I will sign off.  Please call if any further GI concerns or questions.  We would like to thank you for the opportunity to participate in the care of Alexis Farmer.

## 2024-06-08 NOTE — Transfer of Care (Signed)
 Immediate Anesthesia Transfer of Care Note  Patient: Alexis Farmer  Procedure(s) Performed: COLONOSCOPY POLYPECTOMY, INTESTINE  Patient Location: PACU  Anesthesia Type:General  Level of Consciousness: awake  Airway & Oxygen Therapy: Patient Spontanous Breathing  Post-op Assessment: Report given to RN  Post vital signs: stable  Last Vitals:  Vitals Value Taken Time  BP 102/65 06/08/24 15:21  Temp 36.3 C 06/08/24 15:14  Pulse 75 06/08/24 15:21  Resp 11 06/08/24 15:21  SpO2 97 % 06/08/24 15:21  Vitals shown include unfiled device data.  Last Pain:  Vitals:   06/08/24 1514  TempSrc: Temporal  PainSc: 0-No pain         Complications: No notable events documented.

## 2024-06-08 NOTE — Anesthesia Preprocedure Evaluation (Addendum)
 "                                  Anesthesia Evaluation  Patient identified by MRN, date of birth, ID band Patient awake    Reviewed: Allergy & Precautions, NPO status , Patient's Chart, lab work & pertinent test results  Airway Mallampati: III  TM Distance: >3 FB Neck ROM: full    Dental  (+) Edentulous Upper, Partial Lower   Pulmonary neg pulmonary ROS   Pulmonary exam normal  + decreased breath sounds      Cardiovascular Exercise Tolerance: Poor hypertension, Pt. on medications + Peripheral Vascular Disease and +CHF  Normal cardiovascular exam Rhythm:Regular Rate:Normal     Neuro/Psych   Anxiety     CVA negative neurological ROS  negative psych ROS   GI/Hepatic negative GI ROS, Neg liver ROS,,,  Endo/Other  diabetes, Type 2, Oral Hypoglycemic AgentsHypothyroidism  Class 3 obesity  Renal/GU CRFRenal disease  negative genitourinary   Musculoskeletal  (+) Arthritis ,    Abdominal  (+) + obese  Peds negative pediatric ROS (+)  Hematology negative hematology ROS (+)   Anesthesia Other Findings Past Medical History: 04/01/2023: Acute on chronic heart failure with preserved ejection  fraction (HFpEF) (HCC) 09/13/2015: Anxiety No date: Arthritis 01/15/2021: Atypical chest pain 10/24/2021: [place] 07/17/2016: Bradycardia     Comment:  Formatting of this note might be different from the               original.  Last Assessment & Plan:   Found to be               bradycardic at outside hospital. They stopped metoprolol                and verapamil  and heart rate has been normal since then.               Echo appears to have been reassuring at the outside               facility. Appears to be doing much better with regards to              this. She'll continue to monitor for recurrent symptoms.               She'll continue to 01/26/2017: Cervical spondylosis with radiculopathy     Comment:  Formatting of this note might be different from the                original.  Last Assessment & Plan:   Continues to have               issues with this.  She is following with a specialist and              it sounds as though they are planning on an MRI.  Benign               exam today. No date: Chickenpox 04/08/2014: Chronic kidney disease, stage 2 (mild)     Comment:  Dr. Dennise Deal of this note might be               different from the original.  Dr. Dennise     Last               Assessment & Plan:   Recheck kidney function today. 11/17/2019: Constipation  01/25/2016: Cough     Comment:  Formatting of this note might be different from the               original.  Last Assessment & Plan:   Minimal nighttime               cough. Improves when she washes her pillows consistently.              Suspect allergies contributing. She'll monitor. No date: Diabetes mellitus without complication (HCC)     Comment:  one elevated reading/ no treatment 06/24/2018: Disorder of rotator cuff No date: Diverticulitis 03/22/2017: Dysuria     Comment:  Formatting of this note might be different from the               original.  Last Assessment & Plan:   Symptoms concerning               for UTI.  Will check urinalysis. 04/08/2014: Edema of foot     Comment:  Formatting of this note might be different from the               original.  Last Assessment & Plan:   Chronic pedal edema.              Suspect venous insufficiency. No orthopnea or shortness               of breath. Advised elevation of her legs. Consider               compression stockings in the future. 12/15/2020: Elevated troponin 04/08/2014: Essential hypertension     Comment:  Formatting of this note might be different from the               original.  Last Assessment & Plan:   Well-controlled on               recheck.  Continue current regimen. 03/26/2018: Fall 08/12/2021: Gastroenteritis 08/03/2015: Gastrointestinal hemorrhage No date: GI bleed No date: High cholesterol No  date: History of blood transfusion 03/11/2015: Hyperglycemia due to type 2 diabetes mellitus (HCC) No date: Hypertension 12/15/2020: Hypertensive urgency 04/08/2014: Low back pain 04/08/2014: Morbid obesity (HCC)     Comment:  Formatting of this note might be different from the               original.  Last Assessment & Plan:   Weight is stable.               Discussed diet and exercise at length. Encouraged               whatever exercise she can do. Given diet information. 12/15/2020: Palpitations 12/22/2019: Peripheral edema 07/17/2016: Postmenopausal bleeding     Comment:  Formatting of this note might be different from the               original.  Last Assessment & Plan:   Recent D&C.               Following with gynecology. Benign findings. Monitor for               recurrence. 03/25/2020: Primary hypertension 08/10/2020: Pure hypercholesterolemia 01/26/2017: Radiculopathy due to cervical spondylosis     Comment:  Formatting of this note might be different from the               original.  Last Assessment & Plan:   Continues to have  issues with this.  She is following with a specialist and              it sounds as though they are planning on an MRI.  Benign               exam today. 08/13/2019: Recurrent falls No date: Renal insufficiency 10/24/2021: Skin cyst 01/15/2021: Stage 3a chronic kidney disease (HCC) 07/19/2016: Swelling of lower leg 10/13/2015: Vertigo     Comment:  Formatting of this note might be different from the               original.  Last Assessment & Plan:   No recurrence.               Discussed that meclizine  is an as needed medication and               that she does not need to take this daily. She will               continue to monitor.  Past Surgical History: No date: APPENDECTOMY No date: CHOLECYSTECTOMY No date: ECTOPIC PREGNANCY SURGERY No date: EYE SURGERY     Comment:  bilateral cataracts No date: EYE SURGERY     Comment:   02/11/2019 repair hole in right eye  No date: gallbladder  10/26/2016: HYSTEROSCOPY WITH D & C; N/A     Comment:  Procedure: DILATATION AND CURETTAGE /HYSTEROSCOPY;                Surgeon: Verdon Keen, MD;  Location: [facility] ORS;                Service: Gynecology;  Laterality: N/A; 07/07/2018: HYSTEROSCOPY WITH D & C; N/A     Comment:  Procedure: DILATATION AND CURETTAGE /HYSTEROSCOPY;                Surgeon: Verdon Keen, MD;  Location: [facility] ORS;                Service: Gynecology;  Laterality: N/A; 10/10/2023: RIGHT HEART CATH; Bilateral     Comment:  Procedure: RIGHT HEART CATH;  Surgeon: Cherrie Toribio SAUNDERS, MD;  Location: [facility] INVASIVE CV LAB;  Service:               Cardiovascular;  Laterality: Bilateral; No date: THYROIDECTOMY, PARTIAL  BMI    Body Mass Index: 39.48 kg/m      Reproductive/Obstetrics negative OB ROS                              Anesthesia Physical Anesthesia Plan  ASA: 3  Anesthesia Plan: General   Post-op Pain Management:    Induction: Intravenous  PONV Risk Score and Plan: Propofol  infusion and TIVA  Airway Management Planned: Natural Airway and Nasal Cannula  Additional Equipment:   Intra-op Plan:   Post-operative Plan:   Informed Consent: I have reviewed the patients History and Physical, chart, labs and discussed the procedure including the risks, benefits and alternatives for the proposed anesthesia with the patient or authorized representative who has indicated his/her understanding and acceptance.     Dental Advisory Given  Plan Discussed with: CRNA  Anesthesia Plan Comments:          Anesthesia Quick Evaluation  "

## 2024-06-08 NOTE — Progress Notes (Addendum)
 " PROGRESS NOTE  Alexis Farmer FMW:[id number] DOB: March 21, 1941   PCP: Bair, Kalpana, MD  Patient is from: Home.  Lives with granddaughter.  Uses rolling walker for mobility  DOA: 06/05/2024 LOS: 0  Chief complaints Chief Complaint  Patient presents with   Abdominal Pain     Brief Narrative / Interim history: 83 year old F with PMH of HFpEF, PAH, CVA, CKD-4, DM-2, HTN, HLD, MGUS and morbid obesity presenting with abdominal pain, right leg/hip pain and weakness, and admitted with working diagnosis of colitis, possible cystitis, AKI and right leg weakness.   In ED, stable vitals.  Na 130. Cr 2.36 (b/l~1.7).  BUN 41.  CBC without significant finding.  Troponin 77.  Lactic acid 1.7.  UA with moderate Hgb and 6-10 RBC.  CT head without acute finding.  CT abdomen and pelvis raise concern for colitis, proctitis and cystitis.  Received fluid bolus and analgesics, and admitted for further care.  MRI brain, pelvic x-ray and lower extremity venous Doppler ordered.  GI consulted.  MRI brain without acute finding.  Pelvic x-ray and right femur CT showed moderate arthritis in hip and knee.  LLE venous Doppler negative for DVT.  On further interview and evaluation, does not seem to have GI symptoms other than chronic constipation.  GI planning colonoscopy.  Subjective: Seen and examined earlier this morning.  No major events overnight or this morning.  Continues to endorse right thigh pain.  Rates her pain 6/10.  However, her main concern is about colonoscopy and having colon cancer.  She is anxious and tearful  Assessment and plan: Right hip/leg pain/right leg weakness: Likely due to osteoarthritis as noted on pelvic x-ray and right femoral CT.  Reports more dependence on right leg due to left leg weakness after stroke.  Seems like she was referred to orthopedic surgery by PCP last month.  MRI brain without acute finding.  Does not look like radiculopathic either.  LE Doppler negative for DVT.  Exam  with diffuse tenderness in her right thigh and right hip. -Multimodal pain control with scheduled Tylenol , as needed oxycodone  and Dilaudid . -Continue home gabapentin  -Bowel regimen -PT/OT eval  Abnormal CT abdomen: Raise concern for colitis, proctitis and cystitis.  Not having GI symptoms other than chronic constipation.  Has Foley catheter for urinary tension.  UA not convincing for UTI.  C. difficile antigen positive toxin and PCR negative.  GIP negative as well -Plan for colonoscopy  AKI on CKD-4: b/l Cr ~1.7.  Likely prerenal and concurrent use of diuretics.  AKI resolved.  Excellent urine output. Recent Labs    10/21/23 0651 10/22/23 0440 10/23/23 0329 11/15/23 1127 01/14/24 0943 06/03/24 0923 06/05/24 1424 06/06/24 0613 06/07/24 0549 06/08/24 0723  BUN 32* 28* 27* 23 31* 30* 41* 38* 27* 19  CREATININE 1.79* 1.62* 1.51* 1.65* 1.77* 2.04* 2.36* 1.82* 1.42* 1.22*  -Continue monitoring  IDDM-2 with hyperglycemia: A1c 8.8%.  She is on Novolin N and R at home.  Recent Labs  Lab 06/07/24 1227 06/07/24 1632 06/07/24 1930 06/08/24 0736 06/08/24 1108  GLUCAP 218* 202* 224* 194* 223*  -On Lantus  10 units daily.  -Continue SSI-moderate -Continue NovoLog  5 units 3 times daily with meal  -Further adjustment as appropriate.  Chronic HFpEF/severe PAH: TTE in 09/2023 is LVEF of 60 to 65%, G1DD and RVSP of 80 mmHg.  No cardiopulmonary symptoms.  Appears euvolemic on exam but difficult due to body habitus.  On torsemide  and Aldactone  at home -Continue home torsemide  and Aldactone  -Closely monitor  fluid and respiratory status while on IV fluid  Essential hypertension: Markedly elevated BP earlier this morning.  She was very anxious and tearful about colonoscopy.  BP improved later.  -Continue Coreg  12.5 mg twice daily -Decrease hydralazine  from 100 mg every 6 hours to every 8 hours. -Continue home torsemide .  Resume home Aldactone  -Reassured patient that CT did not show anything  suggesting malignancy or cancer  History of CVA/PAD/hyperlipidemia -Continue home aspirin , Plavix  and statin.  Urinary retention: Had Foley catheter placed on 12/17 with UOP of 600 cc per chart review. - Will attempt voiding trial prior after colonoscopy. - Manage constipation  Hypokalemia -Monitor replenish K and Mg as appropriate  Hyponatremia: Mild.  Improved. 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          DVT prophylaxis:  heparin  injection 5,000 Units Start: 06/05/24 2200  Code Status: Full code Family Communication: None at the bedside Level of care: Med-Surg Status is: Observation Inpatient appropriate because: Right hip/leg pain, AKI and possible colitis   Final disposition: SNF   55 minutes with more than 50% spent in reviewing records, counseling patient/family and coordinating care.  Consultants:  Gastroenterology  Procedures: None  Microbiology summarized: GIP negative C. difficile antigen positive but toxin and PCR negative  Objective: Vitals:   06/07/24 2346 06/08/24 0325 06/08/24 0733 06/08/24 1106  BP: (!) 181/50 (!) 179/59 (!) 198/69 123/63  Pulse: 82 82 89 80  Resp:   18 18  Temp: 98.8 F (37.1 C) 98.9 F (37.2 C) 98.9 F (37.2 C) 98.8 F (37.1 C)  TempSrc:  Oral    SpO2: 98% 100% 97% 99%  Weight:  101.1 kg    Height:        Examination:  GENERAL: No apparent distress.  Nontoxic. HEENT: MMM.  Vision and hearing grossly intact.  NECK: Supple.  No apparent JVD.  RESP:  No IWOB.  Fair aeration bilaterally. CVS:  RRR. Heart sounds normal.  ABD/GI/GU: BS+. Abd soft, NTND.  Foley catheter in place. MSK/EXT:  Moves extremities.  Weakness in right leg mainly due to pain. SKIN: no apparent skin lesion or wound NEURO: AA.  Oriented appropriately.  No apparent focal neuro deficit. PSYCH: Anxious  and tearful about colonoscopy  Sch Meds:  Scheduled Meds:  acetaminophen   975 mg Oral Q6H WA   amLODipine   5 mg Oral Daily   carvedilol   12.5 mg Oral BID WC   Chlorhexidine  Gluconate Cloth  6 each Topical Daily   clopidogrel   75 mg Oral Daily   heparin   5,000 Units Subcutaneous Q8H   hydrALAZINE   100 mg Oral Q6H   Influenza vac split trivalent PF  0.5 mL Intramuscular Tomorrow-1000   insulin  aspart  0-15 Units Subcutaneous TID WC   insulin  aspart  0-5 Units Subcutaneous QHS   insulin  aspart  5 Units Subcutaneous TID WC   insulin  glargine  10 Units Subcutaneous Daily   rosuvastatin   20 mg Oral Daily   senna-docusate  2 tablet Oral BID   sodium chloride  flush  3 mL Intravenous Q12H   spironolactone   25 mg Oral Q M,W,F   torsemide   30 mg Oral Daily   Continuous Infusions:  sodium chloride  15 mL/hr at 06/08/24 0909   PRN Meds:.HYDROmorphone  (DILAUDID ) injection, ondansetron  **OR** ondansetron  (ZOFRAN ) IV, oxyCODONE   Antimicrobials: Anti-infectives (From admission, onward)  None        I have personally reviewed the following labs and images: CBC: Recent Labs  Lab 06/03/24 0923 06/05/24 1424 06/06/24 0613 06/07/24 0549 06/08/24 0723  WBC 8.6 10.3 8.1 7.1 7.3  NEUTROABS  --  4.7  --   --   --   HGB 11.1* 11.7* 9.6* 10.3* 11.1*  HCT 33.9* 37.2 29.9* 31.7* 33.6*  MCV 88.1 90.7 89.8 89.8 89.1  PLT 322 180 277 307 320   BMP &GFR Recent Labs  Lab 06/03/24 0923 06/05/24 1424 06/06/24 0613 06/07/24 0549 06/08/24 0723  NA 129* 130* 134* 137 139  K 4.3 3.6 3.3* 3.8 3.6  CL 90* 88* 94* 99 99  CO2 28 30 30 29 28   GLUCOSE 156* 226* 245* 221* 210*  BUN 30* 41* 38* 27* 19  CREATININE 2.04* 2.36* 1.82* 1.42* 1.22*  CALCIUM  9.8 9.9 9.0 9.3 9.5  MG  --  2.6* 2.2  2.1 1.9 1.7  PHOS  --   --   --  2.4*  --    Estimated Creatinine Clearance: 38.3 mL/min (A) (by C-G formula based on SCr of 1.22 mg/dL (H)). Liver & Pancreas: Recent Labs  Lab 06/03/24 0923  06/05/24 1424 06/07/24 0549 06/08/24 0723  AST 29 23  --  25  ALT 15 14  --  13  ALKPHOS 45 51  --  44  BILITOT 0.5 1.3*  --  0.6  PROT 7.4 7.4  --  6.7  ALBUMIN 4.0 4.1 3.4* 3.6   Recent Labs  Lab 06/05/24 1424  LIPASE 22   No results for input(s): AMMONIA in the last 168 hours. Diabetic: Recent Labs    06/05/24 1424  HGBA1C 8.8*   Recent Labs  Lab 06/07/24 1227 06/07/24 1632 06/07/24 1930 06/08/24 0736 06/08/24 1108  GLUCAP 218* 202* 224* 194* 223*   Cardiac Enzymes: Recent Labs  Lab 06/06/24 0613  CKTOTAL 203   No results for input(s): PROBNP in the last 8760 hours. Coagulation Profile: Recent Labs  Lab 06/05/24 1424  INR 1.0   Thyroid  Function Tests: Recent Labs    06/06/24 0613  TSH 1.890   Lipid Profile: No results for input(s): CHOL, HDL, LDLCALC, TRIG, CHOLHDL, LDLDIRECT in the last 72 hours. Anemia Panel: Recent Labs    06/06/24 0613 06/06/24 1543  VITAMINB12  --  818  FOLATE  --  >20.0  FERRITIN 410*  --   TIBC 207*  --   IRON 26*  --   RETICCTPCT 2.5  --    Urine analysis:    Component Value Date/Time   COLORURINE STRAW (A) 06/05/2024 1448   APPEARANCEUR CLEAR (A) 06/05/2024 1448   APPEARANCEUR Clear 10/22/2018 0844   LABSPEC 1.004 (L) 06/05/2024 1448   PHURINE 5.0 06/05/2024 1448   GLUCOSEU NEGATIVE 06/05/2024 1448   HGBUR MODERATE (A) 06/05/2024 1448   BILIRUBINUR NEGATIVE 06/05/2024 1448   BILIRUBINUR neg 03/17/2024 1449   BILIRUBINUR Negative 10/22/2018 0844   KETONESUR NEGATIVE 06/05/2024 1448   PROTEINUR 30 (A) 06/05/2024 1448   UROBILINOGEN 0.2 03/17/2024 1449   UROBILINOGEN 0.2 03/09/2020 1022   NITRITE NEGATIVE 06/05/2024 1448   LEUKOCYTESUR NEGATIVE 06/05/2024 1448   Sepsis Labs: Invalid input(s): PROCALCITONIN, LACTICIDVEN  Microbiology: Recent Results (from the past 240 hours)  Gastrointestinal Panel by PCR , Stool     Status: None   Collection Time: 06/05/24 10:18 PM   Specimen:  Stool  Result Value Ref Range Status   Campylobacter species NOT  DETECTED NOT DETECTED Final   Plesimonas shigelloides NOT DETECTED NOT DETECTED Final   Salmonella species NOT DETECTED NOT DETECTED Final   Yersinia enterocolitica NOT DETECTED NOT DETECTED Final   Vibrio species NOT DETECTED NOT DETECTED Final   Vibrio cholerae NOT DETECTED NOT DETECTED Final   Enteroaggregative E coli (EAEC) NOT DETECTED NOT DETECTED Final   Enteropathogenic E coli (EPEC) NOT DETECTED NOT DETECTED Final   Enterotoxigenic E coli (ETEC) NOT DETECTED NOT DETECTED Final   Shiga like toxin producing E coli (STEC) NOT DETECTED NOT DETECTED Final   Shigella/Enteroinvasive E coli (EIEC) NOT DETECTED NOT DETECTED Final   Cryptosporidium NOT DETECTED NOT DETECTED Final   Cyclospora cayetanensis NOT DETECTED NOT DETECTED Final   Entamoeba histolytica NOT DETECTED NOT DETECTED Final   Giardia lamblia NOT DETECTED NOT DETECTED Final   Adenovirus F40/41 NOT DETECTED NOT DETECTED Final   Astrovirus NOT DETECTED NOT DETECTED Final   Norovirus GI/GII NOT DETECTED NOT DETECTED Final   Rotavirus A NOT DETECTED NOT DETECTED Final   Sapovirus (I, II, IV, and V) NOT DETECTED NOT DETECTED Final    Comment: Performed at [facility], [address] [place], [place] [address]  C Difficile Quick Screen w PCR reflex     Status: Abnormal   Collection Time: 06/05/24 10:18 PM   Specimen: Stool  Result Value Ref Range Status   C Diff antigen POSITIVE (A) NEGATIVE Final   C Diff toxin NEGATIVE NEGATIVE Final   C Diff interpretation Results are indeterminate. See PCR results.  Final    Comment: Performed at [facility], [address] [place], [place] [address]  C. Diff by PCR, Reflexed     Status: None   Collection Time: 06/05/24 10:18 PM  Result Value Ref Range Status   Toxigenic C. Difficile by PCR NEGATIVE NEGATIVE Final    Comment: Patient is colonized with non toxigenic C. difficile. May not need  treatment unless significant symptoms are present.   Hypervirulent Strain PRESUMPTIVE NEGATIVE PRESUMPTIVE NEGATIVE Final    Comment: Performed at [facility], [address] [place], [place] [address]    Radiology Studies: No results found.     Dontee Jaso T. Afnan Emberton [facility]  If 7PM-7AM, please contact night-coverage [email or web address] 06/08/2024, 11:27 AM   "

## 2024-06-08 NOTE — Anesthesia Postprocedure Evaluation (Signed)
"   Anesthesia Post Note  Patient: Alexis Farmer  Procedure(s) Performed: COLONOSCOPY POLYPECTOMY, INTESTINE  Patient location during evaluation: Endoscopy Anesthesia Type: General Level of consciousness: awake and alert Pain management: pain level controlled Vital Signs Assessment: post-procedure vital signs reviewed and stable Respiratory status: spontaneous breathing, nonlabored ventilation and respiratory function stable Cardiovascular status: blood pressure returned to baseline and stable Postop Assessment: no apparent nausea or vomiting Anesthetic complications: no   No notable events documented.   Last Vitals:  Vitals:   06/08/24 1534 06/08/24 1621  BP: (!) 140/83 130/70  Pulse: 78 77  Resp: 15 18  Temp:  37.1 C  SpO2: 98% 98%    Last Pain:  Vitals:   06/08/24 1534  TempSrc:   PainSc: 0-No pain                 Fairy POUR Kristiane Morsch      "

## 2024-06-08 NOTE — Progress Notes (Signed)
 Occupational Therapy Treatment Patient Details Name: Alexis Farmer MRN: [id number] DOB: 24-Mar-1941 Today's Date: 06/08/2024   History of present illness 83yoF who comes to [facility] after recent consipation, pt noted to be holding urine as well on arrival. Pt reports progression in RLE pain that has been limiting since Septmber this year, posterior/anterior groin/hip down to knee, no pain below knee. At baseline pt tolerates limited household AMB distances with walker, has WC for longer mobility needs. PMH: multiple CVA. PMHx: monoclonal B-cell lymphoma, stage IIIb CKD, DM, HTN, HFpEF, chronic pain, obesity, vertigo.   OT comments  Pt is supine in bed on arrival. Easily arousable and agreeable to OT session. She continues to have RLE pain. Pt performed bed mobility with Min A for both supine<>sit with cueing for hand placement. Pt required Min/CGA from elevated bed height to stand to RW and perform step pivot from bed<>BSC using RW with increased time, cues and RW management and Min/Mod A overall. Max A for standing peri-care, cues for hand placement and technique to safely stand from [facility] and maintain balance at RW. Pt returned to bed with all needs in place and will cont to require skilled acute OT services to maximize her safety and IND to return to PLOF.       If plan is discharge home, recommend the following:  A lot of help with bathing/dressing/bathroom;Assistance with cooking/housework;Assist for transportation;Help with stairs or ramp for entrance;A little help with walking and/or transfers   Equipment Recommendations  None recommended by OT;Other (comment) (defer to next  venue)    Recommendations for Other Services      Precautions / Restrictions Precautions Precautions: Fall Recall of Precautions/Restrictions: Intact Restrictions Weight Bearing Restrictions Per Provider Order: No       Mobility Bed Mobility Overal bed mobility: Needs Assistance Bed Mobility: Supine to Sit,  Sit to Supine     Supine to sit: Min assist, HOB elevated Sit to supine: Min assist   General bed mobility comments: trunkal assist via HHA to reach EOB and BLE management to return to supine    Transfers Overall transfer level: Needs assistance Equipment used: Rolling walker (2 wheels) Transfers: Sit to/from Stand, Bed to chair/wheelchair/BSC Sit to Stand: Contact guard assist, Min assist, From elevated surface     Step pivot transfers: Min assist     General transfer comment: increased time and cues to stand from EOB to RW and perform step pivot from bed<>BSC     Balance Overall balance assessment: Needs assistance Sitting-balance support: Feet supported, Single extremity supported Sitting balance-Leahy Scale: Good     Standing balance support: Reliant on assistive device for balance, Bilateral upper extremity supported, During functional activity Standing balance-Leahy Scale: Poor Standing balance comment: requires BUE support on RW and +1 external support                           ADL either performed or assessed with clinical judgement   ADL Overall ADL's : Needs assistance/impaired                         Toilet Transfer: Rolling walker (2 wheels);BSC/3in1;Minimal assistance Toilet Transfer Details (indicate cue type and reason): increased time and cues for safety using RW Toileting- Clothing Manipulation and Hygiene: Maximal assistance;Sit to/from stand Toileting - Clothing Manipulation Details (indicate cue type and reason): after cont BM on BSC     Functional mobility during ADLs: Minimal assistance;Rolling  walker (2 wheels)      Extremity/Trunk Assessment              Vision       Perception     Praxis     Communication Communication Communication: Impaired Factors Affecting Communication: Hearing impaired   Cognition Arousal: Alert Behavior During Therapy: WFL for tasks assessed/performed Cognition: No apparent  impairments                               Following commands: Intact        Cueing   Cueing Techniques: Verbal cues  Exercises      Shoulder Instructions       General Comments      Pertinent Vitals/ Pain       Pain Assessment Pain Assessment: Faces Faces Pain Scale: Hurts little more Pain Location: RLE Pain Descriptors / Indicators: Burning, Discomfort Pain Intervention(s): Monitored during session, Repositioned, Limited activity within patient's tolerance  Home Living                                          Prior Functioning/Environment              Frequency  Min 2X/week        Progress Toward Goals  OT Goals(current goals can now be found in the care plan section)  Progress towards OT goals: Progressing toward goals  Acute Rehab OT Goals Patient Stated Goal: go home OT Goal Formulation: With patient Time For Goal Achievement: 06/20/24 Potential to Achieve Goals: Fair  Plan      Co-evaluation                 AM-PAC OT 6 Clicks Daily Activity     Outcome Measure   Help from another person eating meals?: None Help from another person taking care of personal grooming?: A Little Help from another person toileting, which includes using toliet, bedpan, or urinal?: A Lot Help from another person bathing (including washing, rinsing, drying)?: A Little Help from another person to put on and taking off regular upper body clothing?: A Little Help from another person to put on and taking off regular lower body clothing?: A Lot 6 Click Score: 17    End of Session Equipment Utilized During Treatment: Rolling walker (2 wheels)  OT Visit Diagnosis: Unsteadiness on feet (R26.81);Repeated falls (R29.6);Muscle weakness (generalized) (M62.81)   Activity Tolerance Patient limited by pain;Patient tolerated treatment well   Patient Left in bed;with call bell/phone within reach;with bed alarm set;with family/visitor  present   Nurse Communication Mobility status        Time: 8846-8774 OT Time Calculation (min): 32 min  Charges: OT General Charges $OT Visit: 1 Visit OT Treatments $Self Care/Home Management : 23-37 mins  Alexis Farmer, OTR/L  06/08/2024, 1:16 PM   Tish Begin E Farmer 06/08/2024, 1:13 PM

## 2024-06-08 NOTE — Progress Notes (Signed)
 Patient refused her 5 units of base insulin  but took her 3 units of sliding scale due to her not eating. RN educated patient and notified the MD

## 2024-06-08 NOTE — Op Note (Signed)
 [facility] Gastroenterology Patient Name: Alexis Farmer Procedure Date: 06/08/2024 2:41 PM MRN: [id number] Account #: [id number] Date of Birth: January 08, 1941 Admit Type: Inpatient Age: 83 Room: [facility] ENDO ROOM 3 Gender: Female Note Status: Finalized Instrument Name: Colon Scope [phone] Procedure:             Colonoscopy Indications:           Abnormal CT of the GI tract Providers:             Rogelia Copping MD, MD Referring MD:          Luke Shade (Referring MD) Medicines:             Propofol  per Anesthesia Complications:         No immediate complications. Procedure:             Pre-Anesthesia Assessment:                        - Prior to the procedure, a History and Physical was                         performed, and patient medications and allergies were                         reviewed. The patient's tolerance of previous                         anesthesia was also reviewed. The risks and benefits                         of the procedure and the sedation options and risks                         were discussed with the patient. All questions were                         answered, and informed consent was obtained. Prior                         Anticoagulants: The patient has taken no anticoagulant                         or antiplatelet agents. ASA Grade Assessment: II - A                         patient with mild systemic disease. After reviewing                         the risks and benefits, the patient was deemed in                         satisfactory condition to undergo the procedure.                        After obtaining informed consent, the colonoscope was                         passed under direct vision. Throughout the procedure,  the patient's blood pressure, pulse, and oxygen                         saturations were monitored continuously. The                         Colonoscope was introduced through the anus and                          advanced to the the cecum, identified by appendiceal                         orifice and ileocecal valve. The colonoscopy was                         performed without difficulty. The patient tolerated                         the procedure well. The quality of the bowel                         preparation was excellent. Findings:      The perianal and digital rectal examinations were normal.      Nonbleeding ulcerated mucosa with no stigmata of recent bleeding were       present at the hepatic flexure.      A 2 mm polyp was found in the cecum. The polyp was sessile. The polyp       was removed with a cold biopsy forceps. Resection and retrieval were       complete.      Multiple small-mouthed diverticula were found in the sigmoid colon.      Non-bleeding internal hemorrhoids were found during retroflexion. The       hemorrhoids were Grade II (internal hemorrhoids that prolapse but reduce       spontaneously). Impression:            - Mucosal ulceration at the hepatic flexure.                        - One 2 mm polyp in the cecum, removed with a cold                         biopsy forceps. Resected and retrieved.                        - Diverticulosis in the sigmoid colon.                        - Non-bleeding internal hemorrhoids. Recommendation:        - Return patient to hospital ward for ongoing care.                        - Resume regular diet.                        - Continue present medications. Procedure Code(s):     --- Professional ---                        [phone], Colonoscopy, flexible; with  biopsy, single or                         multiple Diagnosis Code(s):     --- Professional ---                        R93.3, Abnormal findings on diagnostic imaging of                         other parts of digestive tract                        D12.0, Benign neoplasm of cecum CPT copyright 2022 American Medical Association. All rights reserved. The codes documented  in this report are preliminary and upon coder review may  be revised to meet current compliance requirements. Rogelia Copping MD, MD 06/08/2024 3:12:41 PM This report has been signed electronically. Number of Addenda: 0 Note Initiated On: 06/08/2024 2:41 PM Scope Withdrawal Time: 0 hours 5 minutes 28 seconds  Total Procedure Duration: 0 hours 13 minutes 18 seconds  Estimated Blood Loss:  Estimated blood loss: none.      [facility]

## 2024-06-09 ENCOUNTER — Encounter: Payer: Self-pay | Admitting: Gastroenterology

## 2024-06-09 ENCOUNTER — Ambulatory Visit: Payer: Self-pay | Admitting: Gastroenterology

## 2024-06-09 DIAGNOSIS — K529 Noninfective gastroenteritis and colitis, unspecified: Secondary | ICD-10-CM | POA: Diagnosis not present

## 2024-06-09 DIAGNOSIS — D120 Benign neoplasm of cecum: Secondary | ICD-10-CM | POA: Diagnosis not present

## 2024-06-09 LAB — RENAL FUNCTION PANEL
Albumin: 3.7 g/dL (ref 3.5–5.0)
Anion gap: 12 (ref 5–15)
BUN: 14 mg/dL (ref 8–23)
CO2: 28 mmol/L (ref 22–32)
Calcium: 9.3 mg/dL (ref 8.9–10.3)
Chloride: 97 mmol/L — ABNORMAL LOW (ref 98–111)
Creatinine, Ser: 1.23 mg/dL — ABNORMAL HIGH (ref 0.44–1.00)
GFR, Estimated: 43 mL/min — ABNORMAL LOW
Glucose, Bld: 259 mg/dL — ABNORMAL HIGH (ref 70–99)
Phosphorus: 2.4 mg/dL — ABNORMAL LOW (ref 2.5–4.6)
Potassium: 3.1 mmol/L — ABNORMAL LOW (ref 3.5–5.1)
Sodium: 138 mmol/L (ref 135–145)

## 2024-06-09 LAB — CBC
HCT: 33.8 % — ABNORMAL LOW (ref 36.0–46.0)
Hemoglobin: 11 g/dL — ABNORMAL LOW (ref 12.0–15.0)
MCH: 29.3 pg (ref 26.0–34.0)
MCHC: 32.5 g/dL (ref 30.0–36.0)
MCV: 90.1 fL (ref 80.0–100.0)
Platelets: 322 K/uL (ref 150–400)
RBC: 3.75 MIL/uL — ABNORMAL LOW (ref 3.87–5.11)
RDW: 12.5 % (ref 11.5–15.5)
WBC: 7.7 K/uL (ref 4.0–10.5)
nRBC: 0 % (ref 0.0–0.2)

## 2024-06-09 LAB — GLUCOSE, CAPILLARY
Glucose-Capillary: 246 mg/dL — ABNORMAL HIGH (ref 70–99)
Glucose-Capillary: 291 mg/dL — ABNORMAL HIGH (ref 70–99)
Glucose-Capillary: 203 mg/dL — ABNORMAL HIGH (ref 70–99)
Glucose-Capillary: 133 mg/dL — ABNORMAL HIGH (ref 70–99)

## 2024-06-09 LAB — SURGICAL PATHOLOGY

## 2024-06-09 LAB — MAGNESIUM: Magnesium: 1.5 mg/dL — ABNORMAL LOW (ref 1.7–2.4)

## 2024-06-09 MED ORDER — MAGNESIUM SULFATE 2 GM/50ML IV SOLN
2.0000 g | Freq: Once | INTRAVENOUS | Status: AC
  Administered 2024-06-09: 2 g via INTRAVENOUS
  Filled 2024-06-09: qty 50

## 2024-06-09 MED ORDER — POTASSIUM CHLORIDE CRYS ER 20 MEQ PO TBCR
40.0000 meq | EXTENDED_RELEASE_TABLET | Freq: Two times a day (BID) | ORAL | Status: AC
  Administered 2024-06-09 (×2): 40 meq via ORAL
  Filled 2024-06-09 (×2): qty 2

## 2024-06-09 NOTE — Plan of Care (Signed)
" °  Problem: Metabolic: Goal: Ability to maintain appropriate glucose levels will improve Outcome: Progressing   Problem: Skin Integrity: Goal: Risk for impaired skin integrity will decrease Outcome: Progressing   Problem: Clinical Measurements: Goal: Ability to maintain clinical measurements within normal limits will improve Outcome: Progressing   Problem: Pain Managment: Goal: General experience of comfort will improve and/or be controlled Outcome: Progressing   Problem: Safety: Goal: Ability to remain free from injury will improve Outcome: Progressing   "

## 2024-06-09 NOTE — Progress Notes (Signed)
 Nurse navigator met with patient at bedside. She had expressed just prior to the bedside nurse just overdose me with my insulin , that's what I'm gonna do when I get home anyway. Patient had just been told she may not be able to go to SNF for rehab, so I feel this is likely a response to that mixed with grief over current life situation. When asked about it, she stated she would not answer those questions but that she did not have a plan. But felt like she'd be better in heaven with her husband. She expressed feeling worthless related to her current life situation and inability to to do the things she once did--be independent. Appears to be passive SI without a plan or means while hospitalized.  MD and charge nurse made aware.

## 2024-06-09 NOTE — Progress Notes (Signed)
 1. Progress Note   Patient: Alexis Farmer FMW:[id number] DOB: 10-Jan-1941 DOA: 06/05/2024     0 DOS: the patient was seen and examined on 06/09/2024   Brief hospital course:  83 year old F with PMH of HFpEF, PAH, CVA, CKD-4, DM-2, HTN, HLD, MGUS and morbid obesity presenting with abdominal pain, right leg/hip pain and weakness, and admitted with working diagnosis of colitis, possible cystitis, AKI and right leg weakness.    In ED, stable vitals.  Na 130. Cr 2.36 (b/l~1.7).  BUN 41.  CBC without significant finding.  Troponin 77.  Lactic acid 1.7.  UA with moderate Hgb and 6-10 RBC.  CT head without acute finding.  CT abdomen and pelvis raise concern for colitis, proctitis and cystitis.  Received fluid bolus and analgesics, and admitted for further care.  MRI brain, pelvic x-ray and lower extremity venous Doppler ordered.  GI consulted.   MRI brain without acute finding.  Pelvic x-ray and right femur CT showed moderate arthritis in hip and knee.  LLE venous Doppler negative for DVT.   On further interview and evaluation, does not seem to have GI symptoms other than chronic constipation.  GI planning colonoscopy.     Assessment and Plan:   Right hip/leg pain/right leg weakness:  Likely due to osteoarthritis as noted on pelvic x-ray and right femoral CT.  Reports more dependence on right leg due to left leg weakness after stroke.   Seems like she was referred to orthopedic surgery by PCP last month but has not made an appointment yet.   MRI brain without acute finding.  Does not look like radiculopathic either.   LE Doppler negative for DVT.  Exam with diffuse tenderness in her right thigh and right hip. -Multimodal pain control with scheduled Tylenol , as needed oxycodone   -Continue home gabapentin  -Bowel regimen - Seen by PT/OT and they recommend SNF for subacute rehab    Abnormal CT abdomen:  Raise concern for colitis, proctitis and cystitis.   Not having GI symptoms other than  chronic constipation.   Has Foley catheter for urinary retention.  UA not convincing for UTI.   C. difficile antigen positive toxin and PCR negative.  GIP negative as well Status post colonoscopy which showed nonbleeding ulcerated mucosa with no stigmata of recent bleeding at the hepatic flexure.  2 mm polyp found in the cecum.  Multiple small mouth diverticula were found in the sigmoid colon.  Grade 2 internal hemorrhoids were hypertension and tachycardia    AKI on CKD-3a: b/l Cr ~1.7.   Likely prerenal and concurrent use of diuretics.  AKI resolved.   Excellent urine output. 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   Continue Lantus  10 units daily.  Bring -Continue NovoLog  5 units 3 times daily with meal  Maintain consistent carbohydrate diet   Chronic HFpEF/severe PAH:  TTE in 09/2023 is LVEF of 60 to 65%, G1DD and RVSP of 80 mmHg.   No cardiopulmonary symptoms.   Appears euvolemic on exam but difficult due to body habitus.  On torsemide  and Aldactone  at home -Continue home torsemide  and Aldactone     Essential hypertension:  Holding parameters for torsemide  and Aldactone     History of CVA/PAD/hyperlipidemia -Continue home aspirin , Plavix  and statin.   Urinary retention:  Had Foley catheter placed on 12/17 with UOP of 600 cc . Continue Flomax    Hypokalemia/hypomagnesemia Replenish K and Mg as appropriate   Hyponatremia: Mild.  Improved. 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       Subjective: Continues to complain of right hip pain.  Informed by nursing about patient's situational depression  Physical Exam: Vitals:   06/09/24 0500 06/09/24 0750 06/09/24 1152 06/09/24 1155  BP:  131/88 (!) 80/59 93/62  Pulse:  82 81 80  Resp:  18 20   Temp:  99.1  F (37.3 C) 98.2 F (36.8 C)   TempSrc:  Oral Oral   SpO2:  98% 98% 97%  Weight: 95.2 kg     Height:       GENERAL: No apparent distress.  Nontoxic.  Obese HEENT: MMM.  Vision and hearing grossly intact.  NECK: Supple.  No apparent JVD.  RESP:  No IWOB.  Fair aeration bilaterally. CVS:  RRR. Heart sounds normal.  ABD/GI/GU: BS+. Abd soft, NTND.  Foley catheter in place. MSK/EXT:  Moves extremities.  Weakness in right leg mainly due to pain. SKIN: no apparent skin lesion or wound NEURO: AA.  Oriented appropriately.  No apparent focal neuro deficit. PSYCH: Depressed mood, flat affect medically   Data Reviewed: Labs reviewed.  Magnesium  1.5, potassium 3.4 Labs reviewed  Family Communication: Plan of care was discussed with patient at the bedside.  She verbalizes understanding and agrees with the plan   Disposition: Status is: Observation The patient remains OBS appropriate and will d/c before 2 midnights.  Planned Discharge Destination: Skilled nursing facility    Time spent: 40 minutes  Author: Aimee Somerset, MD 06/09/2024 1:26 PM  For on call review [email or web address].

## 2024-06-09 NOTE — Progress Notes (Signed)
 OT Cancellation Note  Patient Details Name: Alexis Farmer MRN: [id number] DOB: Dec 21, 1940   Cancelled Treatment:    Reason Eval/Treat Not Completed: Other (comment). Chart reviewed to date and OT/PT co-tx attempted to progress mobility. Case manager finishing up discussion with pt and pt declining all therapy at this time d/t not in the right mental space. Discussed benefits of rehab regardless of DC disposition, but pt still declining. She then began making suicidal comments, nurse called in and present. Will follow up as indicated following POC tomorrow.   Murel Wigle E Chrismon 06/09/2024, 3:51 PM

## 2024-06-09 NOTE — Progress Notes (Signed)
 Per Dr Lanetta Initiate Suicide Precautions Tele Camera monitoring

## 2024-06-09 NOTE — Progress Notes (Signed)
" °   06/09/24 1400  Spiritual Encounters  Type of Visit Initial  Care provided to: Patient  Conversation partners present during encounter Nurse  Referral source Other (comment) ([facility])  Reason for visit Routine spiritual support  OnCall Visit Yes  Spiritual Framework  Presenting Themes Values and beliefs;Significant life change;Caregiving needs;Meaning/purpose/sources of inspiration;Rituals and practive;Impactful experiences and emotions  Community/Connection Family  Patient Stress Factors Health changes;Exhausted;Loss of control;Major life changes;Lack of caregivers;Family relationships;Loss  Family Stress Factors None identified  Goals  Clinical Care Goals Be admitted to a rehab.  Interventions  Spiritual Care Interventions Made Prayer;Compassionate presence;Reflective listening;Normalization of emotions;Narrative/life review;Explored values/beliefs/practices/strengths  Intervention Outcomes  Outcomes Reduced anxiety;Reduced isolation;Awareness of support;Reduced fear  Spiritual Care Plan  Recommendations for Clinical Staff Patient is looking to go to a rehab to get stronger.   Chaplain offered patient a compassionate presence and reflective listening.  Chaplain asked Nurse about the patient going to rehab facility and she said it's being looked into.  Chaplain prayed with patient.  Rev. Rana M. Nicholaus, M.Div. Chaplain Resident [facility] "

## 2024-06-09 NOTE — NC FL2 (Signed)
 " [place]  MEDICAID [id number] LEVEL OF CARE FORM     IDENTIFICATION  Patient Name: Alexis Farmer Birthdate: Mar 14, 1941 Sex: female Admission Date (Current Location): 06/05/2024  [place] Number:  Chiropodist and Address:  [facility], [name] [place], [place] [address]      Provider Number: [id number]  Attending Physician Name and Address:  Lanetta Lingo, MD  Relative Name and Phone Number:  Ronald Wanita [place], Emergency Contact  [phone]    Current Level of Care: Hospital Recommended Level of Care: Skilled Nursing Facility Prior Approval Number:    Date Approved/Denied:   PASRR Number:    Discharge Plan: SNF    Current Diagnoses: Patient Active Problem List   Diagnosis Date Noted   Polyp of colon 06/08/2024   Colitis 06/05/2024   Right hip pain 05/11/2024   Pain due to onychomycosis of toenails of both feet 05/11/2024   Right leg pain 03/24/2024   Achilles tendinitis 03/18/2024   Moderate episode of recurrent major depressive disorder (HCC) 02/06/2024   Chronic rhinitis 02/06/2024   Insomnia 02/06/2024   History of CVA (cerebrovascular accident) 10/23/2023   Left ankle pain 10/23/2023   Left-sided weakness 10/19/2023   Severe pulmonary hypertension (HCC) 10/01/2023   Diffuse pain 08/03/2023   Cellulitis of left lower extremity 04/02/2023   Hyponatremia 04/01/2023   Cellulitis 04/01/2023   Lower extremity weakness 04/01/2023   Inability to access health care due to transportation insecurity 03/31/2023   Ceruminosis, right 09/07/2022   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/2022   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   Chronic renal failure (CRF), stage 3b (HCC) 03/21/2022   Ileus (HCC) 08/22/2021   Lymphedema 04/19/2021   Atherosclerosis of aorta 01/16/2021   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/16/2021   Spinal stenosis of lumbar region 01/16/2021   Chronic pain  syndrome 08/25/2020   Hypertension associated with diabetes (HCC) 08/25/2020   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) 05/30/2020   Retinopathy 05/30/2020   Multinodular goiter 02/04/2020   Proteinuria 12/22/2019   Constipation 11/17/2019   Physical deconditioning 08/13/2019   Abdominal aortic atherosclerosis 07/24/2019   Obesity, Class III, BMI 40-49.9 (morbid obesity) (HCC) 06/09/2019   Acute renal failure superimposed on stage 3b chronic kidney disease (HCC) 02/06/2019   Round hole, unspecified eye 02/06/2019   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   Disorder of rotator cuff 06/24/2018   Abnormal gait 03/26/2018   Arthritis 03/26/2018   Thyroid  nodule 01/20/2018   Carotid artery stenosis 07/19/2016   PAD (peripheral artery disease) 07/19/2016   Chronic diastolic CHF (congestive heart failure) (HCC) 05/31/2016   Osteoarthritis 10/13/2015   Anxiety 09/13/2015   CKD (chronic kidney disease) stage 4, GFR 15-29 ml/min (HCC) 09/13/2015   Hyperlipidemia 08/26/2014   Uncontrolled type 2 diabetes mellitus with hyperglycemia, with long-term current use of insulin  (HCC) 04/08/2014   Diabetic polyneuropathy (HCC) 04/08/2014   Type 2 diabetes mellitus with other specified complication (HCC) 04/08/2014   Diabetic nephropathy associated with type 2 diabetes mellitus (HCC) 04/08/2014    Orientation RESPIRATION BLADDER Height & Weight     Self, Time, Situation  Normal Incontinent Weight: 209 lb 14.1 oz (95.2 kg) Height:  5' 3 (160 cm)  BEHAVIORAL SYMPTOMS/MOOD NEUROLOGICAL BOWEL NUTRITION STATUS  Other (Comment) (Refuses medications at times despite receiving education and encouragement.)   Incontinent Diet (Diet heart healthy/carb modified Room service appropriate? Yes; Fluid  consistency: Thin starting at 12/22 1620)  AMBULATORY STATUS COMMUNICATION OF NEEDS Skin   Extensive Assist  Verbally Normal                       Personal Care Assistance Level of Assistance  Bathing, Feeding, Dressing Bathing Assistance: Maximum assistance Feeding assistance: Independent Dressing Assistance: Maximum assistance     Functional Limitations Info             SPECIAL CARE FACTORS FREQUENCY  PT (By licensed PT), OT (By licensed OT)     PT Frequency: 2x OT Frequency: 2x            Contractures Contractures Info: Not present    Additional Factors Info  Code Status, Allergies Code Status Info: FULL Allergies Info: Celexa ; Jardiance ; Tape; Norvasc ; Penicillin (LOW)           Current Medications (06/09/2024):  This is the current hospital active medication list Current Facility-Administered Medications  Medication Dose Route Frequency Provider Last Rate Last Admin   0.9 %  sodium chloride  infusion   Intravenous Continuous Therisa Bi, MD 15 mL/hr at 06/08/24 1444 Restarted at 06/08/24 1507   acetaminophen  (TYLENOL ) tablet 975 mg  975 mg Oral Q6H WA Gonfa, Taye T, MD   975 mg at 06/09/24 0300   amLODipine  (NORVASC ) tablet 5 mg  5 mg Oral Daily Gonfa, Taye T, MD   5 mg at 06/08/24 0847   carvedilol  (COREG ) tablet 12.5 mg  12.5 mg Oral BID WC Gonfa, Taye T, MD   12.5 mg at 06/08/24 1648   Chlorhexidine  Gluconate Cloth 2 % PADS 6 each  6 each Topical Daily Gonfa, Taye T, MD   6 each at 06/08/24 0850   clopidogrel  (PLAVIX ) tablet 75 mg  75 mg Oral Daily Khan, Ghalib, MD   75 mg at 06/07/24 9148   fluticasone  (FLONASE ) 50 MCG/ACT nasal spray 1 spray  1 spray Each Nare Daily Kathrin Mignon DASEN, MD   1 spray at 06/08/24 1614   heparin  injection 5,000 Units  5,000 Units Subcutaneous Q8H Khan, Ghalib, MD   5,000 Units at 06/09/24 0612   hydrALAZINE  (APRESOLINE ) tablet 100 mg  100 mg Oral Q8H Gonfa, Taye T, MD   100 mg at 06/09/24 9387   HYDROmorphone  (DILAUDID ) injection 0.5 mg  0.5 mg Intravenous Q4H PRN Gonfa, Taye T, MD   0.5 mg at 06/07/24 1114   insulin  aspart (novoLOG )  injection 0-15 Units  0-15 Units Subcutaneous TID WC Khan, Ghalib, MD   2 Units at 06/08/24 1645   insulin  aspart (novoLOG ) injection 0-5 Units  0-5 Units Subcutaneous QHS Fernand Prost, MD       insulin  aspart (novoLOG ) injection 5 Units  5 Units Subcutaneous TID WC Gonfa, Taye T, MD   5 Units at 06/08/24 1647   insulin  glargine (LANTUS ) injection 10 Units  10 Units Subcutaneous Daily Gonfa, Taye T, MD   10 Units at 06/07/24 1004   ondansetron  (ZOFRAN ) tablet 4 mg  4 mg Oral Q6H PRN Khan, Ghalib, MD       Or   ondansetron  (ZOFRAN ) injection 4 mg  4 mg Intravenous Q6H PRN Fernand Prost, MD       oxyCODONE  (Oxy IR/ROXICODONE ) immediate release tablet 5 mg  5 mg Oral Q6H PRN Gonfa, Taye T, MD   5 mg at 06/07/24 0405   rosuvastatin  (CRESTOR ) tablet 20 mg  20 mg Oral Daily Khan, Ghalib, MD   20 mg at  06/08/24 0847   senna-docusate (Senokot-S) tablet 2 tablet  2 tablet Oral BID Fernand Prost, MD   2 tablet at 06/08/24 2116   sodium chloride  flush (NS) 0.9 % injection 3 mL  3 mL Intravenous Q12H Fernand Prost, MD   3 mL at 06/08/24 2117   spironolactone  (ALDACTONE ) tablet 25 mg  25 mg Oral Q M,W,F Gonfa, Taye T, MD   25 mg at 06/08/24 0846   torsemide  (DEMADEX ) tablet 30 mg  30 mg Oral Daily Gonfa, Taye T, MD   30 mg at 06/08/24 0845     Discharge Medications: Please see discharge summary for a list of discharge medications.  Relevant Imaging Results:  Relevant Lab Results:   Additional Information [id number]  Argelio Granier L Aneli Zara, LCSW     "

## 2024-06-09 NOTE — TOC Progression Note (Signed)
 Transition of Care [facility]) - Progression Note    Patient Details  Name: Alexis Farmer MRN: [id number] Date of Birth: November 28, 1940  Transition of Care [facility]) CM/SW Contact  Nathanael CHRISTELLA Ring, RN Phone Number: 06/09/2024, 3:37 PM  Clinical Narrative:     Granddaughter requested for provider to call her- messaged provider that granddaughter would like to speak with her if she could please give her a call.   Expected Discharge Plan: Home w Home Health Services Barriers to Discharge: Continued Medical Work up               Expected Discharge Plan and Services   Discharge Planning Services: CM Consult   Living arrangements for the past 2 months: Single Family Home                 DME Arranged: N/A         HH Arranged: RN, PT, OT, Nurse's Aide           Social Drivers of Health (SDOH) Interventions SDOH Screenings   Food Insecurity: No Food Insecurity (06/06/2024)  Housing: Low Risk (06/06/2024)  Transportation Needs: No Transportation Needs (06/06/2024)  Utilities: Not At Risk (06/06/2024)  Alcohol Screen: Low Risk (11/29/2022)  Depression (PHQ2-9): High Risk (05/19/2024)  Financial Resource Strain: Medium Risk (10/03/2023)  Physical Activity: Inactive (10/03/2023)  Social Connections: Moderately Integrated (06/06/2024)  Stress: Stress Concern Present (11/29/2022)  Tobacco Use: Low Risk (06/08/2024)    Readmission Risk Interventions    10/24/2023    1:12 PM 10/03/2023   12:03 PM  Readmission Risk Prevention Plan  Transportation Screening Complete Complete  PCP or Specialist Appt within 3-5 Days  Complete  HRI or Home Care Consult  Complete  Social Work Consult for Recovery Care Planning/Counseling  Complete  Palliative Care Screening  Not Applicable  Medication Review Oceanographer) Complete Complete  Palliative Care Screening Not Applicable   Skilled Nursing Facility Complete

## 2024-06-09 NOTE — TOC Progression Note (Signed)
 Transition of Care [facility]) - Progression Note    Patient Details  Name: Alexis Farmer MRN: [id number] Date of Birth: Aug 02, 1940  Transition of Care [facility]) CM/SW Contact  Nathanael CHRISTELLA Ring, RN Phone Number: 06/09/2024, 3:33 PM  Clinical Narrative:     Patient is under observation and has not met the Medicare 3 midnight inpatient stay requirement to cover skilled nursing for rehab.  CM met with patient at the bedside and explained that her Medicare was not going to cover SNF, she immediately became upset and said that she gives up and why bother she might as well die.  She says that she has no family to take care of her.  Therapy came into the room to work with her and she told them she was basically too upset to work with them.   Called and spoke with her granddaughter, Granddaughter verbalizes understanding of the 3 midnight rule.  Did inform her that we could set up [facility].  Patient lives with granddaughter.    Expected Discharge Plan: Home w Home Health Services Barriers to Discharge: Continued Medical Work up               Expected Discharge Plan and Services   Discharge Planning Services: CM Consult   Living arrangements for the past 2 months: Single Family Home                 DME Arranged: N/A         HH Arranged: RN, PT, OT, Nurse's Aide           Social Drivers of Health (SDOH) Interventions SDOH Screenings   Food Insecurity: No Food Insecurity (06/06/2024)  Housing: Low Risk (06/06/2024)  Transportation Needs: No Transportation Needs (06/06/2024)  Utilities: Not At Risk (06/06/2024)  Alcohol Screen: Low Risk (11/29/2022)  Depression (PHQ2-9): High Risk (05/19/2024)  Financial Resource Strain: Medium Risk (10/03/2023)  Physical Activity: Inactive (10/03/2023)  Social Connections: Moderately Integrated (06/06/2024)  Stress: Stress Concern Present (11/29/2022)  Tobacco Use: Low Risk (06/08/2024)    Readmission Risk Interventions    10/24/2023    1:12 PM  10/03/2023   12:03 PM  Readmission Risk Prevention Plan  Transportation Screening Complete Complete  PCP or Specialist Appt within 3-5 Days  Complete  HRI or Home Care Consult  Complete  Social Work Consult for Recovery Care Planning/Counseling  Complete  Palliative Care Screening  Not Applicable  Medication Review Oceanographer) Complete Complete  Palliative Care Screening Not Applicable   Skilled Nursing Facility Complete

## 2024-06-09 NOTE — Plan of Care (Signed)

## 2024-06-10 DIAGNOSIS — D120 Benign neoplasm of cecum: Secondary | ICD-10-CM | POA: Diagnosis not present

## 2024-06-10 DIAGNOSIS — K529 Noninfective gastroenteritis and colitis, unspecified: Secondary | ICD-10-CM | POA: Diagnosis not present

## 2024-06-10 DIAGNOSIS — F4321 Adjustment disorder with depressed mood: Secondary | ICD-10-CM

## 2024-06-10 LAB — GLUCOSE, CAPILLARY
Glucose-Capillary: 251 mg/dL — ABNORMAL HIGH (ref 70–99)
Glucose-Capillary: 302 mg/dL — ABNORMAL HIGH (ref 70–99)
Glucose-Capillary: 220 mg/dL — ABNORMAL HIGH (ref 70–99)
Glucose-Capillary: 89 mg/dL (ref 70–99)
Glucose-Capillary: 125 mg/dL — ABNORMAL HIGH (ref 70–99)

## 2024-06-10 MED ORDER — TAMSULOSIN HCL 0.4 MG PO CAPS
0.4000 mg | ORAL_CAPSULE | Freq: Every day | ORAL | Status: DC
  Administered 2024-06-10 – 2024-06-14 (×5): 0.4 mg via ORAL
  Filled 2024-06-10 (×5): qty 1

## 2024-06-10 NOTE — Progress Notes (Signed)
 " Progress Note   Patient: Alexis Farmer FMW:[id number] DOB: 10/15/1940 DOA: 06/05/2024     0 DOS: the patient was seen and examined on 06/10/2024   Brief hospital course: 83 year old F with PMH of HFpEF, PAH, CVA, CKD-4, DM-2, HTN, HLD, MGUS and morbid obesity presenting with abdominal pain, right leg/hip pain and weakness, and admitted with working diagnosis of colitis, possible cystitis, AKI and right leg weakness.    In ED, stable vitals.  Na 130. Cr 2.36 (b/l~1.7).  BUN 41.  CBC without significant finding.  Troponin 77.  Lactic acid 1.7.  UA with moderate Hgb and 6-10 RBC.  CT head without acute finding.  CT abdomen and pelvis raise concern for colitis, proctitis and cystitis.  Received fluid bolus and analgesics, and admitted for further care.  MRI brain, pelvic x-ray and lower extremity venous Doppler ordered.  GI consulted.   MRI brain without acute finding.  Pelvic x-ray and right femur CT showed moderate arthritis in hip and knee.  LLE venous Doppler negative for DVT.   On further interview and evaluation, does not seem to have GI symptoms other than chronic constipation.  GI planning colonoscopy.    Assessment and Plan:  Right hip/leg pain/right leg weakness:  Likely due to osteoarthritis as noted on pelvic x-ray and right femoral CT.   Reports more dependence on right leg due to left leg weakness after stroke.   Seems like she was referred to orthopedic surgery by PCP last month but has not made an appointment yet.   MRI brain without acute finding.  Does not look like radiculopathic either.   LE Doppler negative for DVT.  Exam with diffuse tenderness in her right thigh and right hip. -Multimodal pain control with scheduled Tylenol , as needed oxycodone   -Continue home gabapentin  -Bowel regimen - Seen by PT/OT and they recommend SNF for subacute rehab     Abnormal CT abdomen:  Raise concern for colitis, proctitis and cystitis.   Not having GI symptoms other than  chronic constipation.   Has Foley catheter for urinary retention.  UA not convincing for UTI.   C. difficile antigen positive toxin and PCR negative.  GIP negative as well Status post colonoscopy which showed nonbleeding ulcerated mucosa with no stigmata of recent bleeding at the hepatic flexure.  2 mm polyp found in the cecum.  Multiple small mouth diverticula were found in the sigmoid colon.  Grade 2 internal hemorrhoids were hypertension and tachycardia     AKI on CKD-3a: b/l Cr ~1.7.   Likely prerenal and concurrent use of diuretics.  AKI resolved.   Excellent urine output. Continue monitoring    IDDM-2 with hyperglycemia and complications of stage 3a chronic kidney disease   : A1c 8.8%.   Continue Lantus  10 units daily.   -Continue NovoLog  5 units 3 times daily with meal  Maintain consistent carbohydrate diet    Chronic HFpEF/severe PAH:  TTE in 09/2023 is LVEF of 60 to 65%, G1DD and RVSP of 80 mmHg.   No cardiopulmonary symptoms.   Appears euvolemic on exam but difficult due to body habitus.  -Continue home carvedilol , torsemide  and Aldactone  with holding parameters     Essential hypertension:  Continue carvedilol , torsemide  and Aldactone  with holding parameters    History of CVA/PAD/hyperlipidemia Continue home aspirin , Plavix  and statin.    Urinary retention:  Had Foley catheter placed on 12/17 with UOP of 600 cc . Continue Flomax     Hypokalemia/hypomagnesemia Replenish K and Mg as appropriate  Hyponatremia: Mild.  Improved. 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             Subjective: Patient is seen and examined at the bedside. Very tearful and sad due to her current condition.  Physical Exam: Vitals:   06/10/24 0012 06/10/24 0500 06/10/24 0518 06/10/24 0732  BP: (!) 167/51  (!) 188/68 (!) 99/58  Pulse: 67  74 66  Resp: 16  16 19   Temp: 99.1 F (37.3  C)  98.3 F (36.8 C) 98.6 F (37 C)  TempSrc: Oral  Oral Oral  SpO2: 99%  95% 96%  Weight:  100 kg    Height:       GENERAL: No apparent distress.  Nontoxic.  Obese HEENT: MMM.  Vision and hearing grossly intact.  NECK: Supple.  No apparent JVD.  RESP:  No IWOB.  Fair aeration bilaterally. CVS:  RRR. Heart sounds normal.  ABD/GI/GU: BS+. Abd soft, NTND.  Foley catheter in place. MSK/EXT:  Moves extremities.  Weakness in right leg mainly due to pain. SKIN: no apparent skin lesion or wound NEURO: AA.  Oriented appropriately.  No apparent focal neuro deficit. PSYCH: Depressed mood, flat affect medically      Data Reviewed:  There are no new results to review at this time.  Family Communication: Plan of care discussed with patient in detail.  She verbalizes understanding and agrees with the plan  Disposition: Status is: Observation The patient remains OBS appropriate and will d/c before 2 midnights.  Planned Discharge Destination: Skilled nursing facility    Time spent: 40 minutes  Author: Aimee Somerset, MD 06/10/2024 11:53 AM  For on call review [email or web address].  "

## 2024-06-10 NOTE — Consult Note (Signed)
 [facility] Psychiatric Consult Initial  Patient Name: .Alexis Farmer  MRN: [id number]  DOB: Feb 26, 1941  Consult Order details:  Orders (From admission, onward)     Start     Ordered   06/10/24 0805  IP CONSULT TO PSYCHIATRY       Ordering Provider: Lanetta Lingo, MD  Provider:  Ruther Millie SAUNDERS, MD  Question Answer Comment  Location [facility]   Reason for Consult? Depression      06/10/24 0805   06/09/24 1307  IP CONSULT TO PSYCHIATRY       Ordering Provider: Lanetta Lingo, MD  Provider:  Ruther Millie SAUNDERS, MD  Question Answer Comment  Location [facility]   Reason for Consult? Depression with passive SI      06/09/24 1307             Mode of Visit: In person    Psychiatry Consult Evaluation  Service Date: June 10, 2024 LOS:  LOS: 0 days  Chief Complaint I just dont care  Primary Psychiatric Diagnoses  Adjustment disorder with depressed mood   Assessment   Patient does not meet criteria for involuntary commitment at this time. Medical team can continue with skilled nursing facility bed search. If a skilled nursing facility is found, patient would likely benefit from psychiatry services at the nursing home or therapy services that may be offered through the facility. Psychiatry will be available and will round on patient as needed while medically hospitalized. Medical team is encouraged to reach out for any new concerns while patient is hospitalized. Of note, medication management options are limited at this time, as patient's recent EKG on 06/05/2024 showed a QTc of 526 milliseconds, current magnesium  level is 1.5 mg/dL, and chart review reveals patient has hyponatremia noted on previous blood work collected during this admission.  Diagnoses:  Active Hospital problems: Principal Problem:   Colitis Active Problems:   Uncontrolled type 2 diabetes mellitus with hyperglycemia, with long-term current use of  insulin  (HCC)   CKD (chronic kidney disease) stage 4, GFR 15-29 ml/min (HCC)   Chronic diastolic CHF (congestive heart failure) (HCC)   PAD (peripheral artery disease)   Obesity, Class III, BMI 40-49.9 (morbid obesity) (HCC)   Hypertension associated with diabetes (HCC)   Monoclonal B-cell lymphocytosis of undetermined significance   History of CVA (cerebrovascular accident)   Right leg pain   Polyp of colon   Adjustment disorder with depressed mood    Plan   ## Psychiatric Medication Recommendations:  Patient declined at this time  ## Medical Decision Making Capacity: Not specifically addressed in this encounter  ## Further Work-up:   -- most recent EKG on 06/05/2024 had QtC of 526 -- Pertinent labwork reviewed earlier this admission includes: CBC, BMP, magnesium , CMP   ## Disposition:-- There are no psychiatric contraindications to discharge at this time  ## Behavioral / Environmental: - No specific recommendations at this time.     ## Safety and Observation Level:  - Based on my clinical evaluation, I estimate the patient to be at low risk of self harm in the current setting. - At this time, we recommend  routine. This decision is based on my review of the chart including patient's history and current presentation, interview of the patient, mental status examination, and consideration of suicide risk including evaluating suicidal ideation, plan, intent, suicidal or self-harm behaviors, risk factors, and protective factors. This judgment is based on our ability to directly address suicide risk, implement  suicide prevention strategies, and develop a safety plan while the patient is in the clinical setting. Please contact our team if there is a concern that risk level has changed.  CSSR Risk Category:C-SSRS RISK CATEGORY: Low Risk  Suicide Risk Assessment: Patient has following modifiable risk factors for suicide: triggering events, which we are addressing by utilizing  therapeutic communication to give patient safe space to discuss concerns. Patient has following non-modifiable or demographic risk factors for suicide: N/A Patient has the following protective factors against suicide: Supportive family, Supportive friends, Cultural, spiritual, or religious beliefs that discourage suicide, no history of suicide attempts, and no history of NSSIB  Thank you for this consult request. Recommendations have been communicated to the primary team.  We will sign off at this time.   Zelda Sharps, NP        History of Present Illness  Relevant Aspects of Hospital ED   Patient Report:  Psychiatry consulted by medical team due to patient making passive suicidal statements. On assessment today, patient confirmed she has made passive suicidal statements but denied any plan or intent, characterizing her statement as more I just do not care either way. Patient reported I cannot kill myself because I will not get to heaven that way and I am a firm believer in God so I would never do that. Patient was initially hesitant to discuss concerns with psychiatry but opened up with this provider throughout the interview.  Patient endorsed increased life stressors, primarily familial conflict with her daughter. She became tearful when discussing that her daughter had been addicted to drugs around age 74, requiring patient to raise her granddaughter as her own daughter. Patient reported her daughter recently made a comment blaming patient for the daughter ending up hurt after a situation involving drugs. Patient was very tearful discussing this and reported feeling she did all she could do, is not sure how this is her fault, but feels guilty and believes her daughter holds this against her. Therapeutic communication was discussed with patient, including the concept that we cannot always be responsible for others' feelings or how they perceive things, but can work to process things internally.  Healthy therapeutic coping mechanisms were reviewed, including deep breathing and reframing negative thoughts. Patient was very engaged with this provider and appeared receptive to the information.  Patient also reported increased stressors regarding her physical health declining. She stated she is not able to do things for herself like she could in the past and finds this very depressing. She reported the combination of all these factors has built up and caused her to feel this way. Patient denied any previous self-harm attempts and denied current homicidal ideation. On mental status examination, patient was alert and oriented to person, place, time, and situation and did not appear to be responding to internal stimuli. There were no objective signs of psychosis or mania. When asked about auditory or visual hallucinations, patient described more of a spiritual warfare type thing where she can see demons that she prays out. However, once she explained these experiences, they appeared more church-oriented and consistent with the beliefs shared by family at bedside. She denied any command hallucinations, and these experiences seemed more related to spiritual beliefs rather than psychiatric concerns.  Patient declined medication management at this time but reported that talking with psychiatry was helpful. Patient was agreeable to psychiatry team rounding on her again if she remains medically admitted.  Psych ROS:  Depression: Endorsed Anxiety: Endorsed Mania (lifetime and current):  Denied Psychosis: (lifetime and current): Denied  Collateral information:  Friend present at bedside throughout duration of assessment interview with patient permission.  Patient voiced preference that family friend remain at bedside throughout this assessment.    Psychiatric and Social History  Psychiatric History:  Information collected from patient/chart review  Prev Dx/Sx: No previous diagnoses per  patient Current Psych Provider: None Home Meds (current): None Previous Med Trials: None Therapy: Denied  Prior Psych Hospitalization: Denied Prior Self Harm: Denied Prior Violence: Denied  Family Psych History: Patient reported substance use history and family Family Hx suicide: Denied  Social History:    Occupational Hx: Not currently employed [employer] Hx: Denied Living Situation: Reported living alone prior to this Spiritual Hx: Endorsed Access to weapons/lethal means: Denied  Substance History Alcohol: Denied  Tobacco: Denied Illicit drugs: Denied Prescription drug abuse: Denied Rehab hx: Denied  Exam Findings  Physical Exam: Reviewed and agree with the physical exam findings conducted by the medical provider Vital Signs:  Temp:  [98 F (36.7 C)-99.2 F (37.3 C)] 98.7 F (37.1 C) (12/24 1159) Pulse Rate:  [66-75] 75 (12/24 1159) Resp:  [16-19] 19 (12/24 1159) BP: (99-188)/(51-79) 134/79 (12/24 1159) SpO2:  [95 %-100 %] 100 % (12/24 1159) Weight:  [100 kg] 100 kg (12/24 0500) Blood pressure 134/79, pulse 75, temperature 98.7 F (37.1 C), temperature source Oral, resp. rate 19, height 5' 3 (1.6 m), weight 100 kg, SpO2 100%. Body mass index is 39.05 kg/m.    Mental Status Exam: General Appearance: Casual  Orientation:  Full (Time, Place, and Person)  Memory:  Immediate;   Good Recent;   Good Remote;   Good  Concentration:  Concentration: Good  Recall:  Good  Attention  Good  Eye Contact:  Good  Speech:  Clear and Coherent  Language:  Good  Volume:  Normal  Mood: Depressed  Affect:  Congruent  Thought Process:  Coherent, Goal Directed, and Linear  Thought Content:  Rumination  Suicidal Thoughts:  Yes.  without intent/plan  Homicidal Thoughts:  No  Judgement:  Fair  Insight:  Fair  Psychomotor Activity:  Normal  Akathisia:  No  Fund of Knowledge:  Good      Assets:  Communication Skills Desire for Improvement Financial  Resources/Insurance Social Support  Cognition:  WNL  ADL's:  Impaired  AIMS (if indicated):        Other History   These have been pulled in through the EMR, reviewed, and updated if appropriate.  Family History:  The patient's family history includes Diabetes in her mother, sister, and another family member; Healthy in her father; Heart disease in her sister; Hypertension in her mother; Stroke in her mother.  Medical History: Past Medical History:  Diagnosis Date   Acute on chronic heart failure with preserved ejection fraction (HFpEF) (HCC) 04/01/2023   Anxiety 09/13/2015   Arthritis    Atypical chest pain 01/15/2021   [place]head 10/24/2021   Bradycardia 07/17/2016   Formatting of this note might be different from the original.  Last Assessment & Plan:   Found to be bradycardic at outside hospital. They stopped metoprolol  and verapamil  and heart rate has been normal since then. Echo appears to have been reassuring at the outside facility. Appears to be doing much better with regards to this. She'll continue to monitor for recurrent symptoms. 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  Last Assessment & Plan:  Continues to have issues with this.  She is following with a specialist and it sounds as though they are planning on an MRI.  Benign exam today.   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 Dennise Deal of this note might be different from the original.  Dr. Dennise      Last Assessment & Plan:   Recheck kidney function today.   Constipation 11/17/2019   Cough 01/25/2016   Formatting of this note might be different from the original.  Last Assessment & Plan:   Minimal nighttime cough. Improves when she washes her pillows consistently. Suspect allergies contributing. She'll monitor.   Diabetes mellitus without complication (HCC)    one elevated reading/  no treatment   Disorder of rotator cuff 06/24/2018   Diverticulitis    Dysuria 03/22/2017   Formatting of this note might be different from the original.  Last Assessment & Plan:   Symptoms concerning for UTI.  Will check urinalysis.   Edema of foot 04/08/2014   Formatting of this note might be different from the original.  Last Assessment & Plan:   Chronic pedal edema. Suspect venous insufficiency. No orthopnea or shortness of breath. Advised elevation of her legs. Consider compression stockings in the future.   Elevated troponin 12/15/2020   Essential hypertension 04/08/2014   Formatting of this note might be different from the original.  Last Assessment & Plan:   Well-controlled on recheck.  Continue current regimen.   Fall 03/26/2018   Gastroenteritis 08/12/2021   Gastrointestinal hemorrhage 08/03/2015   GI bleed    High cholesterol    History of blood transfusion    Hyperglycemia due to type 2 diabetes mellitus (HCC) 03/11/2015   Hypertension    Hypertensive urgency 12/15/2020   Low back pain 04/08/2014   Morbid obesity (HCC) 04/08/2014   Formatting of this note might be different from the original.  Last Assessment & Plan:   Weight is stable. Discussed diet and exercise at length. Encouraged whatever exercise she can do. Given diet information.   Palpitations 12/15/2020   Peripheral edema 12/22/2019   Postmenopausal bleeding 07/17/2016   Formatting of this note might be different from the original.  Last Assessment & Plan:   Recent D&C. Following with gynecology. Benign findings. Monitor for recurrence.   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  Last Assessment & Plan:   Continues to have issues with this.  She is following with a specialist and it sounds as though they are planning on an MRI.  Benign exam today.   Recurrent falls  08/13/2019   Renal insufficiency    Skin cyst 10/24/2021   Stage 3a chronic kidney disease (HCC) 01/15/2021   Swelling of lower leg 07/19/2016   Vertigo 10/13/2015   Formatting of this note might be different from the original.  Last Assessment & Plan:   No recurrence. Discussed that meclizine  is an as needed medication and that she does not need to take this daily. She will continue to monitor.    Surgical History: Past Surgical History:  Procedure Laterality Date   APPENDECTOMY     CHOLECYSTECTOMY     COLONOSCOPY N/A 06/08/2024   Procedure: COLONOSCOPY;  Surgeon: Jinny Carmine, MD;  Location: [facility] ENDOSCOPY;  Service: Endoscopy;  Laterality: N/A;   ECTOPIC PREGNANCY SURGERY  EYE SURGERY     bilateral cataracts   EYE SURGERY     02/11/2019 repair hole in right eye    gallbladder      HYSTEROSCOPY WITH D & C N/A 10/26/2016   Procedure: DILATATION AND CURETTAGE /HYSTEROSCOPY;  Surgeon: Verdon Keen, MD;  Location: [facility] ORS;  Service: Gynecology;  Laterality: N/A;   HYSTEROSCOPY WITH D & C N/A 07/07/2018   Procedure: DILATATION AND CURETTAGE /HYSTEROSCOPY;  Surgeon: Verdon Keen, MD;  Location: [facility] ORS;  Service: Gynecology;  Laterality: N/A;   POLYPECTOMY  06/08/2024   Procedure: POLYPECTOMY, INTESTINE;  Surgeon: Jinny Carmine, MD;  Location: [facility] ENDOSCOPY;  Service: Endoscopy;;   RIGHT HEART CATH Bilateral 10/10/2023   Procedure: RIGHT HEART CATH;  Surgeon: Cherrie Toribio SAUNDERS, MD;  Location: [facility] INVASIVE CV LAB;  Service: Cardiovascular;  Laterality: Bilateral;   THYROIDECTOMY, PARTIAL       Medications:  Current Medications[1]  Allergies: Allergies[2]  Zelda Sharps, NP This note was created using Dragon dictation software. Please excuse any inadvertent transcription errors. Case was discussed with supervising physician Dr. Jadapalle who is agreeable with current plan.       [1]  Current Facility-Administered Medications:    0.9 %  sodium chloride  infusion, ,  Intravenous, Continuous, Therisa Bi, MD, Stopped at 06/10/24 9287   acetaminophen  (TYLENOL ) tablet 975 mg, 975 mg, Oral, Q6H WA, 975 mg at 06/10/24 1124 **OR** [DISCONTINUED] acetaminophen  (TYLENOL ) suppository 650 mg, 650 mg, Rectal, Q6H PRN, Fernand Prost, MD   carvedilol  (COREG ) tablet 12.5 mg, 12.5 mg, Oral, BID WC, Agbata, Tochukwu, MD, 12.5 mg at 06/09/24 1741   Chlorhexidine  Gluconate Cloth 2 % PADS 6 each, 6 each, Topical, Daily, Gonfa, Taye T, MD, 6 each at 06/10/24 1219   clopidogrel  (PLAVIX ) tablet 75 mg, 75 mg, Oral, Daily, Fernand Prost, MD, 75 mg at 06/10/24 1124   fluticasone  (FLONASE ) 50 MCG/ACT nasal spray 1 spray, 1 spray, Each Nare, Daily, Gonfa, Taye T, MD, 1 spray at 06/10/24 1124   heparin  injection 5,000 Units, 5,000 Units, Subcutaneous, Q8H, Fernand Prost, MD, 5,000 Units at 06/10/24 0617   hydrALAZINE  (APRESOLINE ) tablet 100 mg, 100 mg, Oral, Q8H, Gonfa, Taye T, MD, 100 mg at 06/10/24 0617   insulin  aspart (novoLOG ) injection 0-15 Units, 0-15 Units, Subcutaneous, TID WC, Khan, Ghalib, MD, 5 Units at 06/10/24 1244   insulin  aspart (novoLOG ) injection 5 Units, 5 Units, Subcutaneous, TID WC, Gonfa, Taye T, MD, 5 Units at 06/09/24 1240   insulin  glargine (LANTUS ) injection 10 Units, 10 Units, Subcutaneous, Daily, Gonfa, Taye T, MD, 10 Units at 06/10/24 0946   ondansetron  (ZOFRAN ) tablet 4 mg, 4 mg, Oral, Q6H PRN **OR** ondansetron  (ZOFRAN ) injection 4 mg, 4 mg, Intravenous, Q6H PRN, Fernand Prost, MD   oxyCODONE  (Oxy IR/ROXICODONE ) immediate release tablet 5 mg, 5 mg, Oral, Q6H PRN, Gonfa, Taye T, MD, 5 mg at 06/07/24 0405   rosuvastatin  (CRESTOR ) tablet 20 mg, 20 mg, Oral, Daily, Khan, Ghalib, MD, 20 mg at 06/10/24 1124   senna-docusate (Senokot-S) tablet 2 tablet, 2 tablet, Oral, BID, Fernand Prost, MD, 2 tablet at 06/09/24 2116   sodium chloride  flush (NS) 0.9 % injection 3 mL, 3 mL, Intravenous, Q12H, Fernand Prost, MD, 3 mL at 06/09/24 1022   spironolactone  (ALDACTONE ) tablet 25  mg, 25 mg, Oral, Q M,W,F, Agbata, Tochukwu, MD, 25 mg at 06/08/24 9153   tamsulosin  (FLOMAX ) capsule 0.4 mg, 0.4 mg, Oral, Daily, Agbata, Tochukwu, MD   torsemide  (DEMADEX ) tablet 30 mg, 30 mg, Oral,  Daily, Agbata, Tochukwu, MD, 30 mg at 06/09/24 0814 [2]  Allergies Allergen Reactions   Celexa  [Citalopram ]     Diarrhea upset stomach    Jardiance  [Empagliflozin ] Other (See Comments)    Reaction not recalled   Norvasc  [Amlodipine ]     Leg edema   Tape Other (See Comments)    Leaves the skin raw if left on for a period of time- tolerates paper tape   Penicillin V Rash   Penicillin V Potassium Rash   Penicillins Rash    Has patient had a PCN reaction causing immediate rash, facial/tongue/throat swelling, SOB or lightheadedness with hypotension: Yes Has patient had a PCN reaction causing severe rash involving mucus membranes or skin necrosis: No Has patient had a PCN reaction that required hospitalization No Has patient had a PCN reaction occurring within the last 10 years: Yes If all of the above answers are NO, then may proceed with Cephalosporin use.

## 2024-06-10 NOTE — Plan of Care (Signed)
  Problem: Health Behavior/Discharge Planning: Goal: Ability to identify and utilize available resources and services will improve Outcome: Progressing   Problem: Metabolic: Goal: Ability to maintain appropriate glucose levels will improve Outcome: Progressing   Problem: Nutritional: Goal: Maintenance of adequate nutrition will improve Outcome: Progressing   Problem: Tissue Perfusion: Goal: Adequacy of tissue perfusion will improve Outcome: Progressing

## 2024-06-10 NOTE — Progress Notes (Signed)
 Physical Therapy Treatment Patient Details Name: Alexis Farmer MRN: [id number] DOB: 12-27-1940 Today's Date: 06/10/2024   History of Present Illness 83yoF who comes to [facility] after recent consipation, pt noted to be holding urine as well on arrival. Pt reports progression in RLE pain that has been limiting since Septmber this year, posterior/anterior groin/hip down to knee, no pain below knee. At baseline pt tolerates limited household AMB distances with walker, has WC for longer mobility needs. PMH: multiple CVA. PMHx: monoclonal B-cell lymphoma, stage IIIb CKD, DM, HTN, HFpEF, chronic pain, obesity, vertigo.    PT Comments  Pt received in Semi-Fowler's position and agreeable to therapy after maximal encouragement.  Pt tearful upon original attempt and therapist was able to discuss goals and plan for pt in regards to therapy.  Pt eventually agreeable and elected to attempt to sit at the EOB to stretch her LE's.  Pt was able to sit unassisted without UE support >5 minutes with LAQ's being performed to stretch the LE's and engage muscle.  Pt then stood with the use of the RW and for better positioning, side step towards the [facility].  Pt needed modA to assist in returning LE's to the bed.  Pt still lower in the bed, and was able to be placed in boosted position with the pt utilized LE's to push and UE's to pull from headboard to get into proper position.  Pt then left with all needs met and call bell within reach.     If plan is discharge home, recommend the following: A lot of help with walking and/or transfers;Help with stairs or ramp for entrance;Assist for transportation;Assistance with cooking/housework   Can travel by private vehicle     No  Equipment Recommendations  None recommended by PT    Recommendations for Other Services       Precautions / Restrictions Precautions Precautions: Fall Recall of Precautions/Restrictions: Intact Restrictions Weight Bearing Restrictions Per Provider  Order: No     Mobility  Bed Mobility Overal bed mobility: Needs Assistance Bed Mobility: Supine to Sit, Sit to Supine     Supine to sit: Min assist, HOB elevated Sit to supine: Min assist   General bed mobility comments: trunkal assist via HHA to reach EOB and BLE management to return to supine    Transfers Overall transfer level: Needs assistance Equipment used: Rolling walker (2 wheels) Transfers: Sit to/from Stand Sit to Stand: Contact guard assist, Min assist, From elevated surface           General transfer comment: increased time necessary for pt to stand at EOB with RW.  Pt able to take a few side steps towards the [facility].    Ambulation/Gait               General Gait Details: deferred at this time   Stairs             Wheelchair Mobility     Tilt Bed    Modified Rankin (Stroke Patients Only)       Balance Overall balance assessment: Needs assistance Sitting-balance support: Feet supported, Single extremity supported Sitting balance-Leahy Scale: Good     Standing balance support: Reliant on assistive device for balance, Bilateral upper extremity supported, During functional activity Standing balance-Leahy Scale: Poor                              Communication Communication Communication: Impaired Factors Affecting Communication: Hearing impaired  Cognition  Arousal: Alert Behavior During Therapy: WFL for tasks assessed/performed                             Following commands: Intact      Cueing Cueing Techniques: Verbal cues  Exercises      General Comments        Pertinent Vitals/Pain Pain Assessment Pain Assessment: Faces Faces Pain Scale: Hurts little more Pain Location: RLE and low back Pain Descriptors / Indicators: Burning, Discomfort Pain Intervention(s): Monitored during session, Repositioned    Home Living                          Prior Function            PT Goals  (current goals can now be found in the care plan section) Acute Rehab PT Goals Patient Stated Goal: improve leg pain, strength independence with mobility PT Goal Formulation: With patient Time For Goal Achievement: 06/20/24 Potential to Achieve Goals: Fair Progress towards PT goals: Progressing toward goals    Frequency    Min 2X/week      PT Plan      Co-evaluation              AM-PAC PT 6 Clicks Mobility   Outcome Measure  Help needed turning from your back to your side while in a flat bed without using bedrails?: A Lot Help needed moving from lying on your back to sitting on the side of a flat bed without using bedrails?: A Lot Help needed moving to and from a bed to a chair (including a wheelchair)?: A Lot Help needed standing up from a chair using your arms (e.g., wheelchair or bedside chair)?: A Lot Help needed to walk in hospital room?: A Lot Help needed climbing 3-5 steps with a railing? : A Lot 6 Click Score: 12    End of Session Equipment Utilized During Treatment: Gait belt Activity Tolerance: Patient tolerated treatment well;No increased pain Patient left: in bed;with call bell/phone within reach Nurse Communication: Mobility status PT Visit Diagnosis: Other abnormalities of gait and mobility (R26.89);Muscle weakness (generalized) (M62.81);Difficulty in walking, not elsewhere classified (R26.2)     Time: 8563-8483 PT Time Calculation (min) (ACUTE ONLY): 40 min  Charges:    $Therapeutic Activity: 23-37 mins PT General Charges $$ ACUTE PT VISIT: 1 Visit                     Fonda Simpers, PT, DPT Physical Therapist - [facility]  [facility]  06/10/2024, 4:26 PM

## 2024-06-10 NOTE — Progress Notes (Signed)
 OT Cancellation Note  Patient Details Name: Alexis Farmer MRN: [id number] DOB: 1941/05/19   Cancelled Treatment:    Reason Eval/Treat Not Completed: Other (comment). Chart reviewed to date and treatment attempted. Pt is adamantly declining to work with therapy at this time. States she does not want to be bothered and that she cannot do it. Explained benefits of therapy and risk of increased weakness from staying in bed, pt still declines all activity. Left with PT in room to see if she will work with them instead. Pt remains tearful of her overall situation. Will cont to follow POC.  Brylin Stopper E Chrismon 06/10/2024, 2:47 PM

## 2024-06-10 NOTE — Plan of Care (Signed)
   Problem: Nutritional: Goal: Maintenance of adequate nutrition will improve Outcome: Progressing

## 2024-06-11 DIAGNOSIS — K529 Noninfective gastroenteritis and colitis, unspecified: Secondary | ICD-10-CM | POA: Diagnosis not present

## 2024-06-11 DIAGNOSIS — D120 Benign neoplasm of cecum: Secondary | ICD-10-CM | POA: Diagnosis not present

## 2024-06-11 LAB — MAGNESIUM: Magnesium: 1.8 mg/dL (ref 1.7–2.4)

## 2024-06-11 LAB — BASIC METABOLIC PANEL WITH GFR
Anion gap: 11 (ref 5–15)
BUN: 14 mg/dL (ref 8–23)
CO2: 28 mmol/L (ref 22–32)
Calcium: 8.9 mg/dL (ref 8.9–10.3)
Chloride: 98 mmol/L (ref 98–111)
Creatinine, Ser: 1.32 mg/dL — ABNORMAL HIGH (ref 0.44–1.00)
GFR, Estimated: 39 mL/min — ABNORMAL LOW
Glucose, Bld: 152 mg/dL — ABNORMAL HIGH (ref 70–99)
Potassium: 3.5 mmol/L (ref 3.5–5.1)
Sodium: 137 mmol/L (ref 135–145)

## 2024-06-11 LAB — GLUCOSE, CAPILLARY
Glucose-Capillary: 135 mg/dL — ABNORMAL HIGH (ref 70–99)
Glucose-Capillary: 156 mg/dL — ABNORMAL HIGH (ref 70–99)
Glucose-Capillary: 167 mg/dL — ABNORMAL HIGH (ref 70–99)
Glucose-Capillary: 173 mg/dL — ABNORMAL HIGH (ref 70–99)
Glucose-Capillary: 227 mg/dL — ABNORMAL HIGH (ref 70–99)

## 2024-06-11 NOTE — Consult Note (Signed)
 [facility] Psychiatric Consult Follow Up  Patient Name: .Alexis Farmer  MRN: [id number]  DOB: 1940-08-01  Consult Order details:  Orders (From admission, onward)     Start     Ordered   06/10/24 0805  IP CONSULT TO PSYCHIATRY       Ordering Provider: Lanetta Lingo, MD  Provider:  Ruther Millie SAUNDERS, MD  Question Answer Comment  Location [facility]   Reason for Consult? Depression      06/10/24 0805   06/09/24 1307  IP CONSULT TO PSYCHIATRY       Ordering Provider: Lanetta Lingo, MD  Provider:  Ruther Millie SAUNDERS, MD  Question Answer Comment  Location [facility]   Reason for Consult? Depression with passive SI      06/09/24 1307             Mode of Visit: In person    Psychiatry Consult Evaluation  Service Date: June 11, 2024 LOS:  LOS: 0 days  Chief Complaint I just dont care  Primary Psychiatric Diagnoses  Adjustment disorder with depressed mood   Assessment   Patient does not meet criteria for involuntary commitment at this time. Medical team can continue with skilled nursing facility bed search. If a skilled nursing facility is found, patient would likely benefit from psychiatry services at the nursing home or therapy services that may be offered through the facility. Psychiatry will be available and will round on patient as needed while medically hospitalized. Medical team is encouraged to reach out for any new concerns while patient is hospitalized. Of note, medication management options are limited at this time, as patient's recent EKG on 06/05/2024 showed a QTc of 526 milliseconds, current magnesium  level is 1.5 mg/dL, and chart review reveals patient has hyponatremia noted on previous blood work collected during this admission.  12/25: Patient was seen on rounds today by psychiatry. When this provider attempted to assess for suicidal ideation, homicidal ideation, and auditory or visual hallucinations,  patient stated can we please not do that today. However, patient reaffirmed that she would not harm herself and did not feel that she would even be able to physically harm herself. She identified her faith as a firm protective factor. Patient continues to endorse depressive symptoms, primarily related to her current medical situation and discharge planning concerns. She expressed feelings of defeat after being informed she is not a candidate for skilled nursing facility placement. Patient voiced significant concerns regarding her functional independence, stating she does not feel able to independently ambulate to the bathroom or manage her activities of daily living. She reported she lives with her granddaughter, but noted her granddaughter also has a bad back, which heightens her worry about how they will be able to function together at home. Patient expressed concerns about home health services, acknowledging it will be helpful when providers are present but worrying about times when they are not available.  Patient's depressive symptoms remain situational and related to the life stressors outlined above. On mental status examination, patient remained alert and oriented to person, place, time, and situation and was cooperative with the psychiatry team. There was no evidence of psychosis or mania, and patient did not appear to be responding to internal stimuli.  At this time, patient does not meet criteria for involuntary commitment. Medication management was offered, though options remain limited as previously noted due to QTc prolongation, low magnesium , and history of hyponatremia. Patient declined any medications to be added to her  regimen at this time.  Diagnoses:  Active Hospital problems: Principal Problem:   Colitis Active Problems:   Uncontrolled type 2 diabetes mellitus with hyperglycemia, with long-term current use of insulin  (HCC)   CKD (chronic kidney disease) stage 4, GFR 15-29 ml/min  (HCC)   Chronic diastolic CHF (congestive heart failure) (HCC)   PAD (peripheral artery disease)   Obesity, Class III, BMI 40-49.9 (morbid obesity) (HCC)   Hypertension associated with diabetes (HCC)   Monoclonal B-cell lymphocytosis of undetermined significance   History of CVA (cerebrovascular accident)   Right leg pain   Polyp of colon   Adjustment disorder with depressed mood    Plan   ## Psychiatric Medication Recommendations:  Patient declined at this time  ## Medical Decision Making Capacity: Not specifically addressed in this encounter  ## Further Work-up:   -- most recent EKG on 06/05/2024 had QtC of 526 -- Pertinent labwork reviewed earlier this admission includes: CBC, BMP, magnesium , CMP   ## Disposition:-- There are no psychiatric contraindications to discharge at this time  ## Behavioral / Environmental: - No specific recommendations at this time.     ## Safety and Observation Level:  - Based on my clinical evaluation, I estimate the patient to be at low risk of self harm in the current setting. - At this time, we recommend  routine. This decision is based on my review of the chart including patient's history and current presentation, interview of the patient, mental status examination, and consideration of suicide risk including evaluating suicidal ideation, plan, intent, suicidal or self-harm behaviors, risk factors, and protective factors. This judgment is based on our ability to directly address suicide risk, implement suicide prevention strategies, and develop a safety plan while the patient is in the clinical setting. Please contact our team if there is a concern that risk level has changed.  CSSR Risk Category:C-SSRS RISK CATEGORY: Low Risk  Suicide Risk Assessment: Patient has following modifiable risk factors for suicide: triggering events, which we are addressing by utilizing therapeutic communication to give patient safe space to discuss  concerns. Patient has following non-modifiable or demographic risk factors for suicide: N/A Patient has the following protective factors against suicide: Supportive family, Supportive friends, Cultural, spiritual, or religious beliefs that discourage suicide, no history of suicide attempts, and no history of NSSIB  Thank you for this consult request. Recommendations have been communicated to the primary team.  We will sign off at this time.   Zelda Sharps, NP        History of Present Illness  Relevant Aspects of Hospital ED   Patient Report:  Psychiatry consulted by medical team due to patient making passive suicidal statements. On assessment today, patient confirmed she has made passive suicidal statements but denied any plan or intent, characterizing her statement as more I just do not care either way. Patient reported I cannot kill myself because I will not get to heaven that way and I am a firm believer in God so I would never do that. Patient was initially hesitant to discuss concerns with psychiatry but opened up with this provider throughout the interview.  Patient endorsed increased life stressors, primarily familial conflict with her daughter. She became tearful when discussing that her daughter had been addicted to drugs around age 52, requiring patient to raise her granddaughter as her own daughter. Patient reported her daughter recently made a comment blaming patient for the daughter ending up hurt after a situation involving drugs. Patient was very tearful discussing  this and reported feeling she did all she could do, is not sure how this is her fault, but feels guilty and believes her daughter holds this against her. Therapeutic communication was discussed with patient, including the concept that we cannot always be responsible for others' feelings or how they perceive things, but can work to process things internally. Healthy therapeutic coping mechanisms were reviewed, including  deep breathing and reframing negative thoughts. Patient was very engaged with this provider and appeared receptive to the information.  Patient also reported increased stressors regarding her physical health declining. She stated she is not able to do things for herself like she could in the past and finds this very depressing. She reported the combination of all these factors has built up and caused her to feel this way. Patient denied any previous self-harm attempts and denied current homicidal ideation. On mental status examination, patient was alert and oriented to person, place, time, and situation and did not appear to be responding to internal stimuli. There were no objective signs of psychosis or mania. When asked about auditory or visual hallucinations, patient described more of a spiritual warfare type thing where she can see demons that she prays out. However, once she explained these experiences, they appeared more church-oriented and consistent with the beliefs shared by family at bedside. She denied any command hallucinations, and these experiences seemed more related to spiritual beliefs rather than psychiatric concerns.  Patient declined medication management at this time but reported that talking with psychiatry was helpful. Patient was agreeable to psychiatry team rounding on her again if she remains medically admitted.  Psych ROS:  Depression: Endorsed Anxiety: Endorsed Mania (lifetime and current): Denied Psychosis: (lifetime and current): Denied  Collateral information:  Friend present at bedside throughout duration of assessment interview with patient permission.  Patient voiced preference that family friend remain at bedside throughout this assessment.    Psychiatric and Social History  Psychiatric History:  Information collected from patient/chart review  Prev Dx/Sx: No previous diagnoses per patient Current Psych Provider: None Home Meds (current): None Previous Med  Trials: None Therapy: Denied  Prior Psych Hospitalization: Denied Prior Self Harm: Denied Prior Violence: Denied  Family Psych History: Patient reported substance use history and family Family Hx suicide: Denied  Social History:    Occupational Hx: Not currently employed [employer] Hx: Denied Living Situation: Reported living alone prior to this Spiritual Hx: Endorsed Access to weapons/lethal means: Denied  Substance History Alcohol: Denied  Tobacco: Denied Illicit drugs: Denied Prescription drug abuse: Denied Rehab hx: Denied  Exam Findings  Physical Exam: Reviewed and agree with the physical exam findings conducted by the medical provider Vital Signs:  Temp:  [98.3 F (36.8 C)-98.9 F (37.2 C)] 98.3 F (36.8 C) (12/25 0743) Pulse Rate:  [66-77] 68 (12/25 0743) Resp:  [15-19] 15 (12/25 0743) BP: (116-162)/(46-79) 131/68 (12/25 0743) SpO2:  [97 %-100 %] 99 % (12/25 0743) Weight:  [102.5 kg] 102.5 kg (12/25 0500) Blood pressure 131/68, pulse 68, temperature 98.3 F (36.8 C), resp. rate 15, height 5' 3 (1.6 m), weight 102.5 kg, SpO2 99%. Body mass index is 40.03 kg/m.    Mental Status Exam: General Appearance: Casual  Orientation:  Full (Time, Place, and Person)  Memory:  Immediate;   Good Recent;   Good Remote;   Good  Concentration:  Concentration: Good  Recall:  Good  Attention  Good  Eye Contact:  Good  Speech:  Clear and Coherent  Language:  Good  Volume:  Normal  Mood: Depressed  Affect:  Congruent  Thought Process:  Coherent, Goal Directed, and Linear  Thought Content:  Rumination  Suicidal Thoughts:  Denied on today's assessment  Homicidal Thoughts:  No  Judgement:  Fair  Insight:  Fair  Psychomotor Activity:  Normal  Akathisia:  No  Fund of Knowledge:  Good      Assets:  Communication Skills Desire for Improvement Financial Resources/Insurance Social Support  Cognition:  WNL  ADL's:  Impaired  AIMS (if indicated):        Other History    These have been pulled in through the EMR, reviewed, and updated if appropriate.  Family History:  The patient's family history includes Diabetes in her mother, sister, and another family member; Healthy in her father; Heart disease in her sister; Hypertension in her mother; Stroke in her mother.  Medical History: Past Medical History:  Diagnosis Date   Acute on chronic heart failure with preserved ejection fraction (HFpEF) (HCC) 04/01/2023   Anxiety 09/13/2015   Arthritis    Atypical chest pain 01/15/2021   [place]head 10/24/2021   Bradycardia 07/17/2016   Formatting of this note might be different from the original.  Last Assessment & Plan:   Found to be bradycardic at outside hospital. They stopped metoprolol  and verapamil  and heart rate has been normal since then. Echo appears to have been reassuring at the outside facility. Appears to be doing much better with regards to this. She'll continue to monitor for recurrent symptoms. 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  Last Assessment & Plan:   Continues to have issues with this.  She is following with a specialist and it sounds as though they are planning on an MRI.  Benign exam today.   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 Dennise Deal of this note might be different from the original.  Dr. Dennise      Last Assessment & Plan:   Recheck kidney function today.   Constipation 11/17/2019   Cough 01/25/2016   Formatting of this note might be different from the original.  Last Assessment & Plan:   Minimal nighttime cough. Improves when she washes her pillows consistently. Suspect allergies contributing. She'll monitor.   Diabetes mellitus without complication (HCC)    one elevated reading/ no treatment   Disorder of rotator cuff 06/24/2018   Diverticulitis    Dysuria 03/22/2017   Formatting of this note  might be different from the original.  Last Assessment & Plan:   Symptoms concerning for UTI.  Will check urinalysis.   Edema of foot 04/08/2014   Formatting of this note might be different from the original.  Last Assessment & Plan:   Chronic pedal edema. Suspect venous insufficiency. No orthopnea or shortness of breath. Advised elevation of her legs. Consider compression stockings in the future.   Elevated troponin 12/15/2020   Essential hypertension 04/08/2014   Formatting of this note might be different from the original.  Last Assessment & Plan:   Well-controlled on recheck.  Continue current regimen.   Fall 03/26/2018   Gastroenteritis 08/12/2021   Gastrointestinal hemorrhage 08/03/2015   GI bleed    High cholesterol    History of blood transfusion    Hyperglycemia due to type 2 diabetes mellitus (HCC) 03/11/2015   Hypertension    Hypertensive urgency 12/15/2020  Low back pain 04/08/2014   Morbid obesity (HCC) 04/08/2014   Formatting of this note might be different from the original.  Last Assessment & Plan:   Weight is stable. Discussed diet and exercise at length. Encouraged whatever exercise she can do. Given diet information.   Palpitations 12/15/2020   Peripheral edema 12/22/2019   Postmenopausal bleeding 07/17/2016   Formatting of this note might be different from the original.  Last Assessment & Plan:   Recent D&C. Following with gynecology. Benign findings. Monitor for recurrence.   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  Last Assessment & Plan:   Continues to have issues with this.  She is following with a specialist and it sounds as though they are planning on an MRI.  Benign exam today.   Recurrent falls 08/13/2019   Renal insufficiency    Skin cyst 10/24/2021   Stage 3a chronic kidney disease (HCC) 01/15/2021   Swelling  of lower leg 07/19/2016   Vertigo 10/13/2015   Formatting of this note might be different from the original.  Last Assessment & Plan:   No recurrence. Discussed that meclizine  is an as needed medication and that she does not need to take this daily. She will continue to monitor.    Surgical History: Past Surgical History:  Procedure Laterality Date   APPENDECTOMY     CHOLECYSTECTOMY     COLONOSCOPY N/A 06/08/2024   Procedure: COLONOSCOPY;  Surgeon: Jinny Carmine, MD;  Location[facility] ENDOSCOPY;  Service: Endoscopy;  Laterality: N/A;   ECTOPIC PREGNANCY SURGERY     EYE SURGERY     bilateral cataracts   EYE SURGERY     02/11/2019 repair hole in right eye    gallbladder      HYSTEROSCOPY WITH D & C N/A 10/26/2016   Procedure: DILATATION AND CURETTAGE /HYSTEROSCOPY;  Surgeon: Verdon Keen, MD;  Location: [facility] ORS;  Service: Gynecology;  Laterality: N/A;   HYSTEROSCOPY WITH D & C N/A 07/07/2018   Procedure: DILATATION AND CURETTAGE /HYSTEROSCOPY;  Surgeon: Verdon Keen, MD;  Location: [facility] ORS;  Service: Gynecology;  Laterality: N/A;   POLYPECTOMY  06/08/2024   Procedure: POLYPECTOMY, INTESTINE;  Surgeon: Jinny Carmine, MD;  Location: [facility] ENDOSCOPY;  Service: Endoscopy;;   RIGHT HEART CATH Bilateral 10/10/2023   Procedure: RIGHT HEART CATH;  Surgeon: Cherrie Toribio SAUNDERS, MD;  Location: [facility] INVASIVE CV LAB;  Service: Cardiovascular;  Laterality: Bilateral;   THYROIDECTOMY, PARTIAL       Medications:  Current Medications[1]  Allergies: Allergies[2]  Zelda Sharps, NP This note was created using Dragon dictation software. Please excuse any inadvertent transcription errors. Case was discussed with supervising physician Dr. Jadapalle who is agreeable with current plan.       [1]  Current Facility-Administered Medications:    acetaminophen  (TYLENOL ) tablet 975 mg, 975 mg, Oral, Q6H WA, 975 mg at 06/11/24 0836 **OR** [DISCONTINUED] acetaminophen  (TYLENOL ) suppository 650 mg, 650 mg,  Rectal, Q6H PRN, Fernand Prost, MD   carvedilol  (COREG ) tablet 12.5 mg, 12.5 mg, Oral, BID WC, Agbata, Tochukwu, MD, 12.5 mg at 06/11/24 0837   Chlorhexidine  Gluconate Cloth 2 % PADS 6 each, 6 each, Topical, Daily, Gonfa, Taye T, MD, 6 each at 06/10/24 1219   clopidogrel  (PLAVIX ) tablet 75 mg, 75 mg, Oral, Daily, Fernand Prost, MD, 75 mg at 06/11/24 0836   fluticasone  (FLONASE ) 50 MCG/ACT nasal spray 1 spray,  1 spray, Each Nare, Daily, Gonfa, Taye T, MD, 1 spray at 06/11/24 0839   heparin  injection 5,000 Units, 5,000 Units, Subcutaneous, Q8H, Fernand Prost, MD, 5,000 Units at 06/11/24 0608   hydrALAZINE  (APRESOLINE ) tablet 100 mg, 100 mg, Oral, Q8H, Gonfa, Taye T, MD, 100 mg at 06/10/24 2122   insulin  aspart (novoLOG ) injection 0-15 Units, 0-15 Units, Subcutaneous, TID WC, Fernand Prost, MD, 3 Units at 06/11/24 9161   insulin  aspart (novoLOG ) injection 5 Units, 5 Units, Subcutaneous, TID WC, Gonfa, Taye T, MD, 5 Units at 06/11/24 9161   insulin  glargine (LANTUS ) injection 10 Units, 10 Units, Subcutaneous, Daily, Gonfa, Taye T, MD, 10 Units at 06/11/24 0955   ondansetron  (ZOFRAN ) tablet 4 mg, 4 mg, Oral, Q6H PRN **OR** ondansetron  (ZOFRAN ) injection 4 mg, 4 mg, Intravenous, Q6H PRN, Fernand Prost, MD   oxyCODONE  (Oxy IR/ROXICODONE ) immediate release tablet 5 mg, 5 mg, Oral, Q6H PRN, Gonfa, Taye T, MD, 5 mg at 06/11/24 9157   rosuvastatin  (CRESTOR ) tablet 20 mg, 20 mg, Oral, Daily, Fernand Prost, MD, 20 mg at 06/11/24 9162   senna-docusate (Senokot-S) tablet 2 tablet, 2 tablet, Oral, BID, Fernand Prost, MD, 2 tablet at 06/11/24 9164   sodium chloride  flush (NS) 0.9 % injection 3 mL, 3 mL, Intravenous, Q12H, Fernand Prost, MD, 3 mL at 06/11/24 9160   spironolactone  (ALDACTONE ) tablet 25 mg, 25 mg, Oral, Q M,W,F, Agbata, Tochukwu, MD, 25 mg at 06/08/24 9153   tamsulosin  (FLOMAX ) capsule 0.4 mg, 0.4 mg, Oral, Daily, Agbata, Tochukwu, MD, 0.4 mg at 06/11/24 9162   torsemide  (DEMADEX ) tablet 30 mg, 30 mg, Oral,  Daily, Agbata, Tochukwu, MD, 30 mg at 06/11/24 0835 [2]  Allergies Allergen Reactions   Celexa  [Citalopram ]     Diarrhea upset stomach    Jardiance  [Empagliflozin ] Other (See Comments)    Reaction not recalled   Norvasc  [Amlodipine ]     Leg edema   Tape Other (See Comments)    Leaves the skin raw if left on for a period of time- tolerates paper tape   Penicillin V Rash   Penicillin V Potassium Rash   Penicillins Rash    Has patient had a PCN reaction causing immediate rash, facial/tongue/throat swelling, SOB or lightheadedness with hypotension: Yes Has patient had a PCN reaction causing severe rash involving mucus membranes or skin necrosis: No Has patient had a PCN reaction that required hospitalization No Has patient had a PCN reaction occurring within the last 10 years: Yes If all of the above answers are NO, then may proceed with Cephalosporin use.

## 2024-06-11 NOTE — TOC Progression Note (Signed)
 Transition of Care [facility]) - Progression Note    Patient Details  Name: Alexis Farmer MRN: [id number] Date of Birth: 10/18/1940  Transition of Care [facility]) CM/SW Contact  Nathanael CHRISTELLA Ring, RN Phone Number: 06/11/2024, 12:23 PM  Clinical Narrative:    CM spoke with granddaughter, she was really hoping that UR could look at the medical record and come up with a way that patient would meet medical criteria for inpatient because she needs Rehab for strengthening.  There is no medical necessity that meets criteria so she stays Observation.  Granddaughter verbalizes understanding.  She will be out of town and no one will be available to care for the patient until she comes back on Sunday.   She can pick patient up on Sunday.  She chooses [facility] for [facility] services.    Expected Discharge Plan: Home w Home Health Services Barriers to Discharge: Family Issues, Other (must enter comment) (No Caregiver at home until Sunday and her Medicare will not cover SNF due to no qualifying stay)               Expected Discharge Plan and Services   Discharge Planning Services: CM Consult   Living arrangements for the past 2 months: Single Family Home                 DME Arranged: N/A         HH Arranged: PT, OT, Nurse's Aide HH Agency: [facility] Date [facility] Agency Contacted: 06/11/24 Time HH Agency Contacted: 1223     Social Drivers of Health (SDOH) Interventions SDOH Screenings   Food Insecurity: No Food Insecurity (06/06/2024)  Housing: Low Risk (06/06/2024)  Transportation Needs: No Transportation Needs (06/06/2024)  Utilities: Not At Risk (06/06/2024)  Alcohol Screen: Low Risk (11/29/2022)  Depression (PHQ2-9): High Risk (05/19/2024)  Financial Resource Strain: Medium Risk (10/03/2023)  Physical Activity: Inactive (10/03/2023)  Social Connections: Moderately Integrated (06/06/2024)  Stress: Stress Concern Present (11/29/2022)  Tobacco Use: Low Risk (06/08/2024)     Readmission Risk Interventions    10/24/2023    1:12 PM 10/03/2023   12:03 PM  Readmission Risk Prevention Plan  Transportation Screening Complete Complete  PCP or Specialist Appt within 3-5 Days  Complete  HRI or Home Care Consult  Complete  Social Work Consult for Recovery Care Planning/Counseling  Complete  Palliative Care Screening  Not Applicable  Medication Review Oceanographer) Complete Complete  Palliative Care Screening Not Applicable   Skilled Nursing Facility Complete

## 2024-06-11 NOTE — Plan of Care (Signed)

## 2024-06-11 NOTE — Plan of Care (Signed)
  Problem: Coping: Goal: Ability to adjust to condition or change in health will improve Outcome: Progressing   Problem: Health Behavior/Discharge Planning: Goal: Ability to manage health-related needs will improve Outcome: Progressing   Problem: Skin Integrity: Goal: Risk for impaired skin integrity will decrease Outcome: Progressing   Problem: Coping: Goal: Level of anxiety will decrease Outcome: Progressing

## 2024-06-11 NOTE — Progress Notes (Signed)
 [facility]  - [place] at [facility]   PATIENT NAME: Alexis Farmer    MR#:  [id number]  DATE OF BIRTH:  July 24, 1940  SUBJECTIVE:  spoke with patient's granddaughter on the phone in the room. Patient is tearful wants to go to rehab however remains observation for now. Granddaughter is out of town till Sunday. Patient complains of generalized aches and pains more so right hip. Tolerating PO diet. No fever    VITALS:  Blood pressure (!) 103/53, pulse 65, temperature 98.2 F (36.8 C), resp. rate 15, height 5' 3 (1.6 m), weight 102.5 kg, SpO2 95%.  PHYSICAL EXAMINATION:   GENERAL:  83 y.o.-year-old patient with no acute distress. Morbidly obese LUNGS: Normal breath sounds bilaterally, no wheezing CARDIOVASCULAR: S1, S2 normal. No murmur   ABDOMEN: Soft, nontender, nondistended.  EXTREMITIES: No  edema b/l.    NEUROLOGIC: nonfocal  patient is alert and awake  LABORATORY PANEL:  CBC Recent Labs  Lab 06/09/24 0609  WBC 7.7  HGB 11.0*  HCT 33.8*  PLT 322    Chemistries  Recent Labs  Lab 06/08/24 0723 06/09/24 0609 06/11/24 0511  NA 139   < > 137  K 3.6   < > 3.5  CL 99   < > 98  CO2 28   < > 28  GLUCOSE 210*   < > 152*  BUN 19   < > 14  CREATININE 1.22*   < > 1.32*  CALCIUM  9.5   < > 8.9  MG 1.7   < > 1.8  AST 25  --   --   ALT 13  --   --   ALKPHOS 44  --   --   BILITOT 0.6  --   --    < > = values in this interval not displayed.   Assessment and Plan  83 year old F with PMH of HFpEF, PAH, CVA, CKD-4, DM-2, HTN, HLD, MGUS and morbid obesity presenting with abdominal pain, right leg/hip pain and weakness, and admitted with working diagnosis of colitis, possible cystitis, AKI and right leg weakness.   Right hip/leg pain/right leg weakness:  --Likely due to osteoarthritis as noted on pelvic x-ray and right femoral CT.   --Reports more dependence on right leg due to left leg weakness after stroke.   --Seems like she was referred to orthopedic  surgery by PCP last month but has not made an appointment yet.   --MRI brain without acute finding.   --LE Doppler negative for DVT.  Exam with diffuse tenderness in her right thigh and right hip. -Multimodal pain control with scheduled Tylenol , as needed oxycodone   -Continue home gabapentin  -Bowel regimen - Seen by PT/OT and they recommend SNF for subacute rehab--however pt is under OBS (as dteremined by [facility] advisor) and will plan HH at d/c    Abnormal CT abdomen:  --Raise concern for colitis, proctitis and cystitis.   --Not having GI symptoms other than chronic constipation.   --Has Foley catheter for urinary retention.  UA not convincing for UTI.   --C. difficile antigen positive toxin and PCR negative.  GIP negative as well -- colonoscopy which showed nonbleeding ulcerated mucosa with no stigmata of recent bleeding at the hepatic flexure.  2 mm polyp found in the cecum.  Multiple small mouth diverticula were found in the sigmoid colon.  Grade 2 internal hemorrhoids were hypertension and tachycardia. GI signed off    AKI on CKD-3a: b/l Cr ~1.7.   --Likely prerenal and concurrent  use of diuretics.  AKI resolved.    IDDM-2 with hyperglycemia and complications of stage 3a chronic kidney disease --A1c 8.8%.   --Continue Lantus  10 units daily.   -Continue NovoLog  5 units 3 times daily with meal  --Maintain consistent carbohydrate diet -- will resume her home dose regimen with NPH at discharge    Chronic HFpEF/severe PAH:  --TTE in 09/2023 is LVEF of 60 to 65%, G1DD and RVSP of 80 mmHg.   --No cardiopulmonary symptoms.   --Appears euvolemic on exam but difficult due to body habitus.  ---Continue home carvedilol , torsemide  and Aldactone  with holding parameters  Essential hypertension:  --Continue carvedilol , torsemide  and Aldactone  with holding parameters   History of CVA/PAD/hyperlipidemia --Continue home aspirin , Plavix  and statin.   Urinary retention:  Had Foley catheter placed on  12/17 with UOP of 600 cc . --Continue Flomax    Hypokalemia/hypomagnesemia --Replenish K and Mg as appropriate   Hyponatremia: Mild.  Improved.  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   Patient is medically best at baseline for discharge. Patient's granddaughter is not able to pick her up till Sunday. TOC will arrange home health at discharge.  Procedures: colonoscopy Family communication : granddaughter Consults : G.I., psychiatry CODE STATUS: full DVT Prophylaxis : heparin  Level of care: Med-Surg Status is: Observation The patient remains OBS appropriate and will d/c before 2 midnights.    TOTAL TIME TAKING CARE OF THIS PATIENT: 35 minutes.  >50% time spent on counselling and coordination of care  Note: This dictation was prepared with Dragon dictation along with smaller phrase technology. Any transcriptional errors that result from this process are unintentional.  Alexis Farmer M.D    [facility]   CC: Primary care physician; Farmer, Kalpana, MD

## 2024-06-11 NOTE — Progress Notes (Signed)
Per Dr Posey Pronto, d.c tele monitoring

## 2024-06-12 DIAGNOSIS — K529 Noninfective gastroenteritis and colitis, unspecified: Secondary | ICD-10-CM | POA: Diagnosis not present

## 2024-06-12 DIAGNOSIS — D120 Benign neoplasm of cecum: Secondary | ICD-10-CM | POA: Diagnosis not present

## 2024-06-12 LAB — GLUCOSE, CAPILLARY
Glucose-Capillary: 179 mg/dL — ABNORMAL HIGH (ref 70–99)
Glucose-Capillary: 205 mg/dL — ABNORMAL HIGH (ref 70–99)
Glucose-Capillary: 220 mg/dL — ABNORMAL HIGH (ref 70–99)
Glucose-Capillary: 181 mg/dL — ABNORMAL HIGH (ref 70–99)
Glucose-Capillary: 134 mg/dL — ABNORMAL HIGH (ref 70–99)

## 2024-06-12 MED ORDER — ADULT MULTIVITAMIN W/MINERALS CH
1.0000 | ORAL_TABLET | Freq: Every day | ORAL | Status: DC
  Administered 2024-06-13: 1 via ORAL
  Filled 2024-06-12 (×2): qty 1

## 2024-06-12 MED ORDER — ASPIRIN 81 MG PO TBEC
81.0000 mg | DELAYED_RELEASE_TABLET | Freq: Every day | ORAL | Status: DC
  Administered 2024-06-12 – 2024-06-14 (×3): 81 mg via ORAL
  Filled 2024-06-12 (×3): qty 1

## 2024-06-12 NOTE — Progress Notes (Signed)
 Physical Therapy Treatment Patient Details Name: Alexis Farmer MRN: [id number] DOB: September 07, 1940 Today's Date: 06/12/2024   History of Present Illness 83yoF who comes to [facility] after recent consipation, pt noted to be holding urine as well on arrival. Pt reports progression in RLE pain that has been limiting since Septmber this year, posterior/anterior groin/hip down to knee, no pain below knee. At baseline pt tolerates limited household AMB distances with walker, has WC for longer mobility needs. PMH: multiple CVA. PMHx: monoclonal B-cell lymphoma, stage IIIb CKD, DM, HTN, HFpEF, chronic pain, obesity, vertigo.    PT Comments  Pt was long sitting in bed upon arrival. She is A and O x 3. Agreeable to session after max encouragement. Puts forth good effort once agreeable. She does require increased time and MIN assistance to  safely exit L side of bed. Increased assistance required to return to bed and then reposition to [facility]. Pt was able to stand 3 x EOB and take steps along EOB however unwilling to attempt ambulation away form EOB surface. Overall slightly self limiting. Pt is planning to DC with granddaughter on 06/14/24.  I can't ride in a car. I haven't been in a car for over a year. I will need w/c transport. MD/TOC made aware. RN in room at conclusion of PT session.    If plan is discharge home, recommend the following: A little help with walking and/or transfers;A little help with bathing/dressing/bathroom;Assistance with cooking/housework;Direct supervision/assist for medications management;Direct supervision/assist for financial management;Assist for transportation;Supervision due to cognitive status;Help with stairs or ramp for entrance     Equipment Recommendations  Wheelchair (measurements PT);Wheelchair cushion (measurements PT);Rolling walker (2 wheels) (if pt does not already have one)       Precautions / Restrictions Precautions Precautions: Fall Recall of Precautions/Restrictions:  Intact Restrictions Weight Bearing Restrictions Per Provider Order: No     Mobility  Bed Mobility Overal bed mobility: Needs Assistance Bed Mobility: Supine to Sit, Sit to Supine  Supine to sit: Min assist, Used rails, HOB elevated Sit to supine: Used rails, Max assist   Transfers Overall transfer level: Needs assistance Equipment used: Rolling walker (2 wheels) Transfers: Sit to/from Stand Sit to Stand: Contact guard assist, From elevated surface, Min assist (min assist form lower/ standard surface heights)  General transfer comment: Pt endorses sleeping in lift chair at home and uses lift function to stand. She also endorses having elevated toilet seat.    Ambulation/Gait Ambulation/Gait assistance: Contact guard assist Gait Distance (Feet): 3 Feet Assistive device: Rolling walker (2 wheels) Gait Pattern/deviations: Step-to pattern, Trunk flexed Gait velocity: decreased  General Gait Details: Pt stood 3 x total and was easily able to take steps at EOB. Unwilling to attempt gait away from EOB however author feels confident she would be able to if willing. she eaily performed aleternating marching ~ 20 time while standing EOB prior to taking steps along EOB to [place] and FOB    Balance Overall balance assessment: Needs assistance Sitting-balance support: Feet supported Sitting balance-Leahy Scale: Good     Standing balance support: Bilateral upper extremity supported, During functional activity, Reliant on assistive device for balance Standing balance-Leahy Scale: Fair Standing balance comment: relaint on RW for all dynamic standing activity       Communication Communication Communication: Impaired Factors Affecting Communication: Hearing impaired (wears hearing aides)  Cognition Arousal: Alert Behavior During Therapy: WFL for tasks assessed/performed   PT - Cognitive impairments: No apparent impairments      PT - Cognition Comments:  Pt is A and O x 3. Needed  encouragement but eventually agreeable and then remains cooperative throughout remainder of session Following commands: Intact      Cueing Cueing Techniques: Verbal cues, Tactile cues         Pertinent Vitals/Pain Pain Assessment Pain Assessment: 0-10 Pain Score: 5  Pain Location: RLE and low back Pain Descriptors / Indicators: Discomfort Pain Intervention(s): Limited activity within patient's tolerance, Monitored during session, Patient requesting pain meds-RN notified, Repositioned     PT Goals (current goals can now be found in the care plan section) Acute Rehab PT Goals Patient Stated Goal: none stated Progress towards PT goals: Progressing toward goals    Frequency    Min 2X/week       AM-PAC PT 6 Clicks Mobility   Outcome Measure  Help needed turning from your back to your side while in a flat bed without using bedrails?: A Lot Help needed moving from lying on your back to sitting on the side of a flat bed without using bedrails?: A Lot Help needed moving to and from a bed to a chair (including a wheelchair)?: A Lot Help needed standing up from a chair using your arms (e.g., wheelchair or bedside chair)?: A Lot Help needed to walk in hospital room?: A Lot Help needed climbing 3-5 steps with a railing? : A Lot 6 Click Score: 12    End of Session   Activity Tolerance: Patient tolerated treatment well;Other (comment) (self limiting) Patient left: in bed;with call bell/phone within reach;with nursing/sitter in room (pt was unwilling to sit in recliner or OOB) Nurse Communication: Mobility status PT Visit Diagnosis: Other abnormalities of gait and mobility (R26.89);Muscle weakness (generalized) (M62.81);Difficulty in walking, not elsewhere classified (R26.2)     Time: 9098-9080 PT Time Calculation (min) (ACUTE ONLY): 18 min  Charges:    $Therapeutic Activity: 8-22 mins PT General Charges $$ ACUTE PT VISIT: 1 Visit                     Rankin Essex  PTA 06/12/2024, 10:33 AM

## 2024-06-12 NOTE — Progress Notes (Addendum)
 [facility]  - [employer] at [facility]   PATIENT NAME: Alexis Farmer    MR#:  [id number]  DATE OF BIRTH:  1941/04/07  SUBJECTIVE:  no family at bedside. Patient is upset with her discharge planning. She reports she does not appreciate the employees here at the hospital. Per RN no new symptoms/complaint   VITALS:  Blood pressure (!) 136/91, pulse 86, temperature 98.6 F (37 C), temperature source Oral, resp. rate 16, height 5' 3 (1.6 m), weight 102.5 kg, SpO2 98%.  PHYSICAL EXAMINATION:   GENERAL:  83 y.o.-year-old patient with no acute distress. Morbidly obese LUNGS: Normal breath sounds bilaterally, no wheezing CARDIOVASCULAR: S1, S2 normal. No murmur   ABDOMEN: Soft, nontender, nondistended.  EXTREMITIES: No  edema b/l.    NEUROLOGIC: nonfocal  patient is alert and awake  LABORATORY PANEL:  CBC Recent Labs  Lab 06/09/24 0609  WBC 7.7  HGB 11.0*  HCT 33.8*  PLT 322    Chemistries  Recent Labs  Lab 06/08/24 0723 06/09/24 0609 06/11/24 0511  NA 139   < > 137  K 3.6   < > 3.5  CL 99   < > 98  CO2 28   < > 28  GLUCOSE 210*   < > 152*  BUN 19   < > 14  CREATININE 1.22*   < > 1.32*  CALCIUM  9.5   < > 8.9  MG 1.7   < > 1.8  AST 25  --   --   ALT 13  --   --   ALKPHOS 44  --   --   BILITOT 0.6  --   --    < > = values in this interval not displayed.   Assessment and Plan  83 year old F with PMH of HFpEF, PAH, CVA, CKD-4, DM-2, HTN, HLD, MGUS and morbid obesity presenting with abdominal pain, right leg/hip pain and weakness, and admitted with working diagnosis of colitis, possible cystitis, AKI and right leg weakness.   Right hip/leg pain/right leg weakness:  --Likely due to osteoarthritis as noted on pelvic x-ray and right femoral CT.   --Reports more dependence on right leg due to left leg weakness after stroke.   --Seems like she was referred to orthopedic surgery by PCP last month but has not made an appointment yet.   --MRI brain  without acute finding.   --LE Doppler negative for DVT.  Exam with diffuse tenderness in her right thigh and right hip. -Multimodal pain control with scheduled Tylenol , as needed oxycodone   -Continue home gabapentin  -Bowel regimen - Seen by PT/OT and they recommend SNF for subacute rehab--however pt is under OBS (as dteremined by [facility] advisor) and will plan HH at d/c    Abnormal CT abdomen:  --Raise concern for colitis, proctitis and cystitis.   --Not having GI symptoms other than chronic constipation.   --Has Foley catheter for urinary retention.  UA not convincing for UTI.   --C. difficile antigen positive toxin and PCR negative.  GIP negative as well -- colonoscopy which showed nonbleeding ulcerated mucosa with no stigmata of recent bleeding at the hepatic flexure.  2 mm polyp found in the cecum.  Multiple small mouth diverticula were found in the sigmoid colon.  Grade 2 internal hemorrhoids were hypertension and tachycardia. GI signed off    AKI on CKD-3a: b/l Cr ~1.7.   --Likely prerenal and concurrent use of diuretics.  AKI resolved.    IDDM-2 with hyperglycemia and complications of stage  3a chronic kidney disease --A1c 8.8%.   --Continue Lantus  10 units daily.   -Continue NovoLog  5 units 3 times daily with meal  --Maintain consistent carbohydrate diet -- will resume her home dose regimen with NPH at discharge    Chronic HFpEF/severe PAH:  --TTE in 09/2023 is LVEF of 60 to 65%, G1DD and RVSP of 80 mmHg.   --No cardiopulmonary symptoms.   --Appears euvolemic on exam but difficult due to body habitus.  ---Continue home carvedilol , torsemide  and Aldactone  with holding parameters  Essential hypertension:  --Continue carvedilol , torsemide  and Aldactone  with holding parameters   History of CVA/PAD/hyperlipidemia --Continue home aspirin , Plavix  and statin.   Urinary retention:  Had Foley catheter placed on 12/17 with UOP of 600 cc . --Continue Flomax  --f/u Urology    Hypokalemia/hypomagnesemia --Replenish K and Mg as appropriate   Hyponatremia: Mild.  Improved.  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   Patient is medically best at baseline for discharge. Patient's granddaughter is not able to pick her up till Sunday. TOC will arrange home health at discharge.  Procedures: colonoscopy Family communication : none today Consults : G.I., psychiatry CODE STATUS: full DVT Prophylaxis : heparin  Level of care: Med-Surg Status is: Observation The patient remains OBS appropriate and will d/c before 2 midnights.    TOTAL TIME TAKING CARE OF THIS PATIENT: 35 minutes.  >50% time spent on counselling and coordination of care  Note: This dictation was prepared with Dragon dictation along with smaller phrase technology. Any transcriptional errors that result from this process are unintentional.  Leita Blanch M.D    [facility]   CC: Primary care physician; Bair, Kalpana, MD

## 2024-06-12 NOTE — TOC Progression Note (Signed)
 Transition of Care [facility]) - Progression Note    Patient Details  Name: Alexis Farmer MRN: [id number] Date of Birth: June 11, 1941  [facility] CM/SW Contact  Daved JONETTA Hamilton, RN Phone Number: 06/12/2024, 4:56 PM  Clinical Narrative:     This CM notified by PT that patient verbalized unable to be transported in a personal vehicle stating  I can't ride in a car. I haven't been in a car for over a year. I will need w/c transport.   This CM contacted [facility] to arrange transport however, [facility] informed this CM that they do not operate on weekends.   This CM spoke with Wanita Lang, patient's granddaughter, via phone to discuss transportation and informed of the above from [facility]. [name] verbalized that she has no means to transport her grandmother home.   TOC will continue to seek out transportation options for patient discharge.     Expected Discharge Plan: Home w Home Health Services Barriers to Discharge: Family Issues, Other (must enter comment) (No Caregiver at home until Sunday and her Medicare will not cover SNF due to no qualifying stay)               Expected Discharge Plan and Services   Discharge Planning Services: CM Consult   Living arrangements for the past 2 months: Single Family Home                 DME Arranged: N/A         HH Arranged: PT, OT, Nurse's Aide HH Agency: [facility] Date [facility] Agency Contacted: 06/11/24 Time HH Agency Contacted: [phone]     Social Drivers of Health (SDOH) Interventions SDOH Screenings   Food Insecurity: No Food Insecurity (06/06/2024)  Housing: Low Risk (06/06/2024)  Transportation Needs: No Transportation Needs (06/06/2024)  Utilities: Not At Risk (06/06/2024)  Alcohol Screen: Low Risk (11/29/2022)  Depression (PHQ2-9): High Risk (05/19/2024)  Financial Resource Strain: Medium Risk (10/03/2023)  Physical Activity: Inactive (10/03/2023)  Social Connections: Moderately Integrated  (06/06/2024)  Stress: Stress Concern Present (11/29/2022)  Tobacco Use: Low Risk (06/08/2024)    Readmission Risk Interventions    10/24/2023    1:12 PM 10/03/2023   12:03 PM  Readmission Risk Prevention Plan  Transportation Screening Complete Complete  PCP or Specialist Appt within 3-5 Days  Complete  HRI or Home Care Consult  Complete  Social Work Consult for Recovery Care Planning/Counseling  Complete  Palliative Care Screening  Not Applicable  Medication Review [employer]) Complete Complete  Palliative Care Screening Not Applicable   Skilled Nursing Facility Complete

## 2024-06-12 NOTE — Progress Notes (Signed)
 OT Cancellation Note  Patient Details Name: Laurina Fischl MRN: [id number] DOB: 1940-08-03   Cancelled Treatment:    Reason Eval/Treat Not Completed: Patient declined, no reason specified;Other (comment) (reports just getting off the [facility], was very tired and refused any OOB activity)  Maryelizabeth CHRISTELLA Clause 06/12/2024, 4:09 PM

## 2024-06-12 NOTE — Plan of Care (Signed)
  Problem: Coping: Goal: Ability to adjust to condition or change in health will improve Outcome: Progressing   Problem: Metabolic: Goal: Ability to maintain appropriate glucose levels will improve Outcome: Progressing   Problem: Clinical Measurements: Goal: Will remain free from infection Outcome: Progressing   Problem: Coping: Goal: Level of anxiety will decrease Outcome: Progressing

## 2024-06-13 DIAGNOSIS — D120 Benign neoplasm of cecum: Secondary | ICD-10-CM | POA: Diagnosis not present

## 2024-06-13 DIAGNOSIS — K529 Noninfective gastroenteritis and colitis, unspecified: Secondary | ICD-10-CM | POA: Diagnosis not present

## 2024-06-13 LAB — BASIC METABOLIC PANEL WITH GFR
Anion gap: 11 (ref 5–15)
BUN: 15 mg/dL (ref 8–23)
CO2: 24 mmol/L (ref 22–32)
Calcium: 9.1 mg/dL (ref 8.9–10.3)
Chloride: 97 mmol/L — ABNORMAL LOW (ref 98–111)
Creatinine, Ser: 1.32 mg/dL — ABNORMAL HIGH (ref 0.44–1.00)
GFR, Estimated: 39 mL/min — ABNORMAL LOW
Glucose, Bld: 221 mg/dL — ABNORMAL HIGH (ref 70–99)
Potassium: 3.7 mmol/L (ref 3.5–5.1)
Sodium: 133 mmol/L — ABNORMAL LOW (ref 135–145)

## 2024-06-13 LAB — GLUCOSE, CAPILLARY
Glucose-Capillary: 228 mg/dL — ABNORMAL HIGH (ref 70–99)
Glucose-Capillary: 285 mg/dL — ABNORMAL HIGH (ref 70–99)
Glucose-Capillary: 111 mg/dL — ABNORMAL HIGH (ref 70–99)

## 2024-06-13 MED ORDER — GABAPENTIN 300 MG PO CAPS
300.0000 mg | ORAL_CAPSULE | Freq: Every day | ORAL | Status: DC
  Filled 2024-06-13: qty 1

## 2024-06-13 NOTE — Plan of Care (Signed)
 Asked pt if she still wanted to harm herself - she replied, No, I want to go to heaven. Does not want to cause herself additional pain.

## 2024-06-13 NOTE — Progress Notes (Signed)
 " PROGRESS NOTE    Alexis Farmer  FMW:[id number] DOB: 05-Sep-1940 DOA: 06/05/2024 PCP: Abbey Bruckner, MD    Brief Narrative:  83 year old F with PMH of HFpEF, PAH, CVA, CKD-4, DM-2, HTN, HLD, MGUS and morbid obesity presenting with abdominal pain, right leg/hip pain and weakness, and admitted with working diagnosis of colitis, possible cystitis, AKI and right leg weakness.   Patient is currently at her medical baseline and appropriate for discharge. She is currently set for discharge home with [facility] However, her granddaughter is not available to assist at home until Sunday 12/28.   Assessment and Plan:  #Right hip and leg pain #Right leg weakness - Most consistent with degenerative joint disease/osteoarthritis, pelvic x-ray and CT right femur with OA changes - Likely exacerbated by increased reliance on right leg due to chronic left-sided weakness after prior stroke - Workup reassuring with MRI brain without acute findings, lower extremity Doppler negative for DVT - Was referred to orthopedic surgery by PCP but patient was hospitalized again at the time of the appointment and has not been evaluated yet - Continue multimodal pain control with scheduled acetaminophen  and as needed oxycodone  - Home gabapentin  300 mg resumed - PT/OT recommended SNF however patient is under observation status.  Current plan is for patient to go home with home health at discharge on 12/28, the patient will have a caretaker available at home  #Abnormal CT abdomen pelvis -CT abdomen pelvis raise concern for colitis, proctitis, cystitis - Patient without any GI symptoms other than chronic constipation - Patient with indwelling Foley catheter for urinary retention.  UA not concerning for UTI - GI PP negative.  C. difficile positive toxin, negative PCR. - Underwent colonoscopy on 12/22 which showed a single linear ulceration of the [facility] flexure which may represent a small area of ischemia, rest of the colon  showed diverticulosis without any sign of bleeding or formation.  GI signed off.  # AKI on CKD stage 3A -resolved - AKI was likely prerenal with concurrent use of diuretics - Creatinine currently stable at 1.32  #T2DM - Hemoglobin A1c 8.8 - Continue Lantus  10 units daily, NovoLog  5 units TID AC, SSI 0-15  # Chronic HFpEF # Severe PAH - LVEF 60-65%, RSVP 80 mmHg - Currently appears euvolemic  - Continue home Coreg , spironolactone , and torsemide   #HTN - Continue home Coreg , spironolactone , torsemide   #History of CVA #PAD #HLD - Continue home aspirin , Plavix , and statin  #Urinary tension - Had Foley catheter placed on 12/17 with urine output of 600 cc -Continue Flomax  - Follow-up with urology outpatient  #MGUS - Follows with oncology, Dr. Babara  # Obesity Class II (BMI Body mass index is 39.91 kg/m. kg/m) - Obesity is clinically significant and complicates care   Scheduled Meds:  acetaminophen   975 mg Oral Q6H WA   aspirin  EC  81 mg Oral Daily   carvedilol   12.5 mg Oral BID WC   Chlorhexidine  Gluconate Cloth  6 each Topical Daily   clopidogrel   75 mg Oral Daily   fluticasone   1 spray Each Nare Daily   gabapentin   300 mg Oral QHS   heparin   5,000 Units Subcutaneous Q8H   hydrALAZINE   100 mg Oral Q8H   insulin  aspart  0-15 Units Subcutaneous TID WC   insulin  aspart  5 Units Subcutaneous TID WC   insulin  glargine  10 Units Subcutaneous Daily   multivitamin with minerals  1 tablet Oral Q breakfast   rosuvastatin   20 mg Oral Daily  senna-docusate  2 tablet Oral BID   sodium chloride  flush  3 mL Intravenous Q12H   spironolactone   25 mg Oral Q M,W,F   tamsulosin   0.4 mg Oral Daily   torsemide   30 mg Oral Daily   Continuous Infusions: PRN Meds:.ondansetron  **OR** ondansetron  (ZOFRAN ) IV, oxyCODONE   Current Outpatient Medications  Medication Instructions   acetaminophen  (TYLENOL ) 500 mg, Oral, Every 6 hours PRN   Ascorbic Acid (VITAMIN C PO) 1 tablet, Oral, Daily    aspirin  EC 81 mg, Oral, Daily, Swallow whole.   blood glucose meter kit and supplies KIT Accu chek, Dx code E11.65, check 3 times daily   Blood Pressure KIT 1 Device, Does not apply, Daily   carvedilol  (COREG ) 6.25 mg, Oral, 2 times daily with meals   carvedilol  (COREG ) 12.5 mg, Oral, 2 times daily with meals   clopidogrel  (PLAVIX ) 75 mg, Oral, Daily   diclofenac  Sodium (VOLTAREN ) 4 g, Topical, 4 times daily PRN   fluticasone  (FLONASE ) 50 MCG/ACT nasal spray 1 spray, Each Nare, Daily   [START ON 07/11/2024] gabapentin  (NEURONTIN ) 300 mg, Oral, Daily at bedtime   glucose blood (ACCU-CHEK GUIDE TEST) test strip Use as instructed   hydrALAZINE  (APRESOLINE ) 100 MG tablet TAKE 1 TABLET BY MOUTH 4 TIMES DAILY. MUST KEEP UPCOMING APPOINTMENT ON 10/21/23 FOR FUTURE REFILLS.   hydrALAZINE  (APRESOLINE ) 100 mg, Oral, 3 times daily   insulin  aspart (NOVOLOG ) 0-15 Units, Subcutaneous, 3 times daily with meals, Hypoglycemia Standing Orders sidebar report CBG 70 - 120: 0 units CBG 121 - 150: 2 units CBG 151 - 200: 3 units CBG 201 - 250: 5 units CBG 251 - 300: 8 units CBG 301 - 350: 11 units CBG 351 - 400: 15 units CBG > 400: call MD and obtain STAT lab verification   insulin  glargine (LANTUS ) 15 Units, Subcutaneous, Daily   insulin  NPH Human (NOVOLIN N) 15-20 Units, Subcutaneous, 2 times daily before meals, Inject 20 units in the morning an 15 units in the evening.   insulin  regular (NOVOLIN R) 100 units/mL injection 0-0.08 mLs, Subcutaneous, 3 times daily before meals, Sliding scale   Insulin  Syringe-Needle U-100 30G X 1/2 0.5 ML MISC Use it to give insulin  or check blood glucose, 4 times a day or as covered by the insurance   ipratropium-albuterol  (DUONEB) 0.5-2.5 (3) MG/3ML SOLN 3 mLs, 4 times daily   Lactulose  20 GM/30ML SOLN Take 30grams orally BID prn constipation   lidocaine  (LIDODERM ) 5 % 1 patch, Transdermal, Every 24 hours, Remove & Discard patch within 12 hours or as directed by MD   Multiple  Vitamins-Minerals (CENTRUM SILVER 50+WOMEN) TABS 1 tablet, Oral, Daily with breakfast   nystatin  (MYCOSTATIN /NYSTOP ) powder 1 Application, Topical, 2 times daily PRN   rosuvastatin  (CRESTOR ) 20 MG tablet TAKE 1 TABLET BY MOUTH EVERY DAY   senna-docusate (SENOKOT-S) 8.6-50 MG tablet 1 tablet, Oral, 2 times daily   spironolactone  (ALDACTONE ) 25 mg, Oral, 3 times weekly, Monday Wednesday and friday   torsemide  (DEMADEX ) 30 mg, Oral, Daily    DVT prophylaxis: heparin  injection 5,000 Units Start: 06/05/24 2200   Code Status:   Code Status: Full Code  Family Communication: None  Disposition Plan: Home with HH on 12/28 PT - Follow Up Recommendations: Skilled nursing-short term rehab (<3 hours/day) - PT equipment: Wheelchair (measurements PT), Wheelchair cushion (measurements PT), Rolling walker (2 wheels) (if pt does not already have one) OT - Follow Up Recommendations: Skilled nursing-short term rehab (<3 hours/day) -    Level of  care: Med-Surg  Consultants:  GI, psych  Procedures:  Colonoscopy 12/22  Antimicrobials: None   Subjective: Patient examined at bedside.  Patient reports continued frustration with overall health status as well as hospitalization course and hospital staff.  She maintains that she is at the same level of discomfort and generalized weakness as her admission.  She continues to complain of right sided leg pain.  Objective: Vitals:   06/13/24 0401 06/13/24 0500 06/13/24 0753 06/13/24 1542  BP: (!) 123/49  129/75 (!) 105/49  Pulse: 69  79 64  Resp: 18  16 18   Temp: 97.8 F (36.6 C)  98.2 F (36.8 C) 98.4 F (36.9 C)  TempSrc: Oral  Oral   SpO2: 98%  98% 100%  Weight:  102.2 kg    Height:        Intake/Output Summary (Last 24 hours) at 06/13/2024 1704 Last data filed at 06/12/2024 2216 Gross per 24 hour  Intake 3 ml  Output 775 ml  Net -772 ml   Filed Weights   06/10/24 0500 06/11/24 0500 06/13/24 0500  Weight: 100 kg 102.5 kg 102.2 kg     Examination:  Gen: NAD, A&Ox3 HEENT: NCAT, EOMI Neck: Supple, no JVD, no LAD CV: RRR, no murmurs Resp: normal WOB, CTAB, no w/r/r Abd: Soft, NTND, no guarding, BS normoactive Ext: No LE edema, pulses 2+ b/l Skin: Warm, dry, no rashes/lesions Neuro: CN II-XII grossly intact, strength 5/5 b/l, sensation intact Psych: Calm, cooperative, appropriate affect    Data Reviewed: I have personally reviewed following labs and imaging studies  CBC: Recent Labs  Lab 06/07/24 0549 06/08/24 0723 06/09/24 0609  WBC 7.1 7.3 7.7  HGB 10.3* 11.1* 11.0*  HCT 31.7* 33.6* 33.8*  MCV 89.8 89.1 90.1  PLT 307 320 322   Basic Metabolic Panel: Recent Labs  Lab 06/07/24 0549 06/08/24 0723 06/09/24 0609 06/11/24 0511 06/13/24 1357  NA 137 139 138 137 133*  K 3.8 3.6 3.1* 3.5 3.7  CL 99 99 97* 98 97*  CO2 29 28 28 28 24   GLUCOSE 221* 210* 259* 152* 221*  BUN 27* 19 14 14 15   CREATININE 1.42* 1.22* 1.23* 1.32* 1.32*  CALCIUM  9.3 9.5 9.3 8.9 9.1  MG 1.9 1.7 1.5* 1.8  --   PHOS 2.4*  --  2.4*  --   --    GFR: Estimated Creatinine Clearance: 35.6 mL/min (A) (by C-G formula based on SCr of 1.32 mg/dL (H)). Liver Function Tests: Recent Labs  Lab 06/07/24 0549 06/08/24 0723 06/09/24 0609  AST  --  25  --   ALT  --  13  --   ALKPHOS  --  44  --   BILITOT  --  0.6  --   PROT  --  6.7  --   ALBUMIN 3.4* 3.6 3.7   No results for input(s): LIPASE, AMYLASE in the last 168 hours. No results for input(s): AMMONIA in the last 168 hours. Coagulation Profile: No results for input(s): INR, PROTIME in the last 168 hours. Cardiac Enzymes: No results for input(s): CKTOTAL, CKMB, CKMBINDEX, TROPONINI in the last 168 hours. BNP (last 3 results) No results for input(s): PROBNP in the last 8760 hours. HbA1C: No results for input(s): HGBA1C in the last 72 hours. CBG: Recent Labs  Lab 06/12/24 1534 06/12/24 2135 06/13/24 0754 06/13/24 1152 06/13/24 1609  GLUCAP 181*  134* 228* 285* 111*   Lipid Profile: No results for input(s): CHOL, HDL, LDLCALC, TRIG, CHOLHDL, LDLDIRECT in the  last 72 hours. Thyroid  Function Tests: No results for input(s): TSH, T4TOTAL, FREET4, T3FREE, THYROIDAB in the last 72 hours. Anemia Panel: No results for input(s): VITAMINB12, FOLATE, FERRITIN, TIBC, IRON, RETICCTPCT in the last 72 hours. Sepsis Labs: No results for input(s): PROCALCITON, LATICACIDVEN in the last 168 hours.  Recent Results (from the past 240 hours)  Gastrointestinal Panel by PCR , Stool     Status: None   Collection Time: 06/05/24 10:18 PM   Specimen: Stool  Result Value Ref Range Status   Campylobacter species NOT DETECTED NOT DETECTED Final   Plesimonas shigelloides NOT DETECTED NOT DETECTED Final   Salmonella species NOT DETECTED NOT DETECTED Final   Yersinia enterocolitica NOT DETECTED NOT DETECTED Final   Vibrio species NOT DETECTED NOT DETECTED Final   Vibrio cholerae NOT DETECTED NOT DETECTED Final   Enteroaggregative E coli (EAEC) NOT DETECTED NOT DETECTED Final   Enteropathogenic E coli (EPEC) NOT DETECTED NOT DETECTED Final   Enterotoxigenic E coli (ETEC) NOT DETECTED NOT DETECTED Final   Shiga like toxin producing E coli (STEC) NOT DETECTED NOT DETECTED Final   Shigella/Enteroinvasive E coli (EIEC) NOT DETECTED NOT DETECTED Final   Cryptosporidium NOT DETECTED NOT DETECTED Final   Cyclospora cayetanensis NOT DETECTED NOT DETECTED Final   Entamoeba histolytica NOT DETECTED NOT DETECTED Final   Giardia lamblia NOT DETECTED NOT DETECTED Final   Adenovirus F40/41 NOT DETECTED NOT DETECTED Final   Astrovirus NOT DETECTED NOT DETECTED Final   Norovirus GI/GII NOT DETECTED NOT DETECTED Final   Rotavirus A NOT DETECTED NOT DETECTED Final   Sapovirus (I, II, IV, and V) NOT DETECTED NOT DETECTED Final    Comment: Performed at [facility], [address] [place], [place] [address]  C Difficile Quick  Screen w PCR reflex     Status: Abnormal   Collection Time: 06/05/24 10:18 PM   Specimen: Stool  Result Value Ref Range Status   C Diff antigen POSITIVE (A) NEGATIVE Final   C Diff toxin NEGATIVE NEGATIVE Final   C Diff interpretation Results are indeterminate. See PCR results.  Final    Comment: Performed at [facility], [address] [place], [place] [address]  C. Diff by PCR, Reflexed     Status: None   Collection Time: 06/05/24 10:18 PM  Result Value Ref Range Status   Toxigenic C. Difficile by PCR NEGATIVE NEGATIVE Final    Comment: Patient is colonized with non toxigenic C. difficile. May not need treatment unless significant symptoms are present.   Hypervirulent Strain PRESUMPTIVE NEGATIVE PRESUMPTIVE NEGATIVE Final    Comment: Performed at [facility], [address] [place], [place] [address]     Radiology Studies: No results found.  Scheduled Meds:  acetaminophen   975 mg Oral Q6H WA   aspirin  EC  81 mg Oral Daily   carvedilol   12.5 mg Oral BID WC   Chlorhexidine  Gluconate Cloth  6 each Topical Daily   clopidogrel   75 mg Oral Daily   fluticasone   1 spray Each Nare Daily   gabapentin   300 mg Oral QHS   heparin   5,000 Units Subcutaneous Q8H   hydrALAZINE   100 mg Oral Q8H   insulin  aspart  0-15 Units Subcutaneous TID WC   insulin  aspart  5 Units Subcutaneous TID WC   insulin  glargine  10 Units Subcutaneous Daily   multivitamin with minerals  1 tablet Oral Q breakfast   rosuvastatin   20 mg Oral Daily   senna-docusate  2  tablet Oral BID   sodium chloride  flush  3 mL Intravenous Q12H   spironolactone   25 mg Oral Q M,W,F   tamsulosin   0.4 mg Oral Daily   torsemide   30 mg Oral Daily   Continuous Infusions:   Unresulted Labs (From admission, onward)    None        LOS:  LOS: 0 days   Time Spent: 40 minutes  Merisa Julio Al-Sultani, MD [facility]  If 7PM-7AM, please contact night-coverage  06/13/2024, 5:04 PM      "

## 2024-06-13 NOTE — Discharge Instructions (Signed)
 Mental health resources:   [facility] [address]  [place], [place] [address] [phone]   [facility] [address] [place],  [place], [place] [address] [phone]

## 2024-06-14 DIAGNOSIS — D120 Benign neoplasm of cecum: Secondary | ICD-10-CM | POA: Diagnosis not present

## 2024-06-14 DIAGNOSIS — K529 Noninfective gastroenteritis and colitis, unspecified: Secondary | ICD-10-CM | POA: Diagnosis not present

## 2024-06-14 LAB — GLUCOSE, CAPILLARY
Glucose-Capillary: 173 mg/dL — ABNORMAL HIGH (ref 70–99)
Glucose-Capillary: 204 mg/dL — ABNORMAL HIGH (ref 70–99)
Glucose-Capillary: 166 mg/dL — ABNORMAL HIGH (ref 70–99)
Glucose-Capillary: 296 mg/dL — ABNORMAL HIGH (ref 70–99)

## 2024-06-14 NOTE — TOC Transition Note (Signed)
 Transition of Care [facility]) - Discharge Note   Patient Details  Name: Alexis Farmer MRN: [id number] Date of Birth: [birth year]  [facility] CM/SW Contact:  Victory Jackquline RAMAN, RN Phone Number: 06/14/2024, 3:41 PM   Clinical Narrative:    RNCM receives a message from the MD via secure chat informing me that the patient was discharging home with HH/PT/OT and needed transportation set up. Also mentioned that PT recommended Wheelchair and RW. RNC called patient's granddaughter, introduced myself and my role and explained that discharge planning would be discussed. She confirmed that the patient already has a Wheel chair and RW at home and that she had [facility] in the past and would like to work with them again. MD made aware. Transportation set up with [facility] and there are 4 people ahead of her.  No further concerns. RNCM signing off.  Final next level of care: Home w Home Health Services Barriers to Discharge: Barriers Resolved   Patient Goals and CMS Choice Patient states their goals for this hospitalization and ongoing recovery are:: patient wants to go to rehab          Discharge Placement                Patient to be transferred to facility by: [facility] Name of family member notified: Rasheeda Patient and family notified of of transfer: 06/14/24  Discharge Plan and Services Additional resources added to the After Visit Summary for     Discharge Planning Services: CM Consult            DME Arranged: N/A         HH Arranged: PT, OT, Nurse's Aide HH Agency: [facility] Date [facility] Agency Contacted: 06/14/24 Time HH Agency Contacted: 1223 Representative spoke with at [facility] Agency: Referral sent via EPIC  Social Drivers of Health (SDOH) Interventions SDOH Screenings   Food Insecurity: No Food Insecurity (06/06/2024)  Housing: Low Risk (06/06/2024)  Transportation Needs: No Transportation Needs (06/06/2024)  Utilities: Not At Risk  (06/06/2024)  Alcohol Screen: Low Risk (11/29/2022)  Depression (PHQ2-9): High Risk (05/19/2024)  Financial Resource Strain: Medium Risk (10/03/2023)  Physical Activity: Inactive (10/03/2023)  Social Connections: Moderately Integrated (06/06/2024)  Stress: Stress Concern Present (11/29/2022)  Tobacco Use: Low Risk (06/08/2024)     Readmission Risk Interventions    10/24/2023    1:12 PM 10/03/2023   12:03 PM  Readmission Risk Prevention Plan  Transportation Screening Complete Complete  PCP or Specialist Appt within 3-5 Days  Complete  HRI or Home Care Consult  Complete  Social Work Consult for Recovery Care Planning/Counseling  Complete  Palliative Care Screening  Not Applicable  Medication Review Oceanographer) Complete Complete  Palliative Care Screening Not Applicable   Skilled Nursing Facility Complete

## 2024-06-14 NOTE — Discharge Summary (Signed)
 "  Physician Discharge Summary   Alexis Farmer  female DOB: [birth year]  FMW:[id number]  PCP: Bair, Kalpana, MD  Admit date: 06/05/2024 Discharge date: 06/14/2024  Admitted From: home Disposition:  home Home Health: Yes CODE STATUS: Full code   Hospital Course:  For full details, please see H&P, progress notes, consult notes and ancillary notes.  Briefly,  Alexis Farmer is a 83 year old F with PMH of HFpEF, PAH, CVA, CKD-4, DM-2, HTN, MGUS and morbid obesity presenting with abdominal pain, right leg/hip pain and weakness, and admitted with working diagnosis of colitis, possible cystitis, AKI and right leg weakness.    #Right hip and leg pain #Right leg weakness - Most consistent with degenerative joint disease/osteoarthritis, pelvic x-ray and CT right femur with OA changes - Likely exacerbated by increased reliance on right leg due to chronic left-sided weakness after prior stroke - Workup reassuring with MRI brain without acute findings, lower extremity Doppler negative for DVT - Was referred to orthopedic surgery by PCP but patient was hospitalized again at the time of the appointment and has not been evaluated yet - cont Home gabapentin  300 mg  - PT/OT recommended SNF however patient is under observation status.  Pt was kept in the hospital until 12/28 when the patient will have a caretaker available at home.   #Abnormal CT abdomen pelvis -CT abdomen pelvis raise concern for colitis, proctitis, cystitis - Patient without any GI symptoms other than chronic constipation --UA not concerning for UTI - GI PP negative.  C. difficile positive toxin, negative PCR. - Underwent colonoscopy on 12/22 which showed a single linear ulceration of the [facility] flexure which may represent a small area of ischemia, rest of the colon showed diverticulosis without any sign of bleeding or formation.  GI signed off.   # AKI on CKD stage 3A -resolved - AKI was likely prerenal with concurrent  use of diuretics.  Cr 2.36 on presentation.  Improved to 1.32 prior to discharge.   #T2DM - Hemoglobin A1c 8.8 - received Lantus  10 units daily, NovoLog  5 units TID AC, SSI 0-15 while inpatient.  To simply the regimen, pt was discharged on just Lantus  15u daily.   # Chronic HFpEF # Severe PAH - LVEF 60-65%, RSVP 80 mmHg - Currently appears euvolemic  - Continue home Coreg , spironolactone , and torsemide   #History of CVA #PAD #HLD - Continue home aspirin , Plavix , and statin   #Urinary tension - Had Foley catheter placed on 12/17 with urine output of 600 cc --Foley removed prior to discharge, pt was able to void    #MGUS - Follows with oncology, Dr. Babara   # Obesity Class II (BMI Body mass index is 39.91 kg/m. kg/m) - Obesity is clinically significant and complicates care   Discharge Diagnoses:  Principal Problem:   Colitis Active Problems:   Hypertension associated with diabetes (HCC)   Chronic diastolic CHF (congestive heart failure) (HCC)   Uncontrolled type 2 diabetes mellitus with hyperglycemia, with long-term current use of insulin  (HCC)   CKD (chronic kidney disease) stage 4, GFR 15-29 ml/min (HCC)   PAD (peripheral artery disease)   Obesity, Class III, BMI 40-49.9 (morbid obesity) (HCC)   Monoclonal B-cell lymphocytosis of undetermined significance   History of CVA (cerebrovascular accident)   Right leg pain   Polyp of colon   Adjustment disorder with depressed mood   30 Day Unplanned Readmission Risk Score    Flowsheet Row ED to Hosp-Admission (Current) from 06/05/2024 in [facility] 1C MEDICAL  TELEMETRY  30 Day Unplanned Readmission Risk Score (%) 26.6 Filed at 06/06/2024 1600    This score is the patient's risk of an unplanned readmission within 30 days of being discharged (0 -100%). The score is based on dignosis, age, lab data, medications, orders, and past utilization.   Low:  0-14.9   Medium: 15-21.9   High: 22-29.9   Extreme: 30 and  above         Discharge Instructions:  Allergies as of 06/14/2024       Reactions   Celexa  [citalopram ]    Diarrhea upset stomach   Jardiance  [empagliflozin ] Other (See Comments)   Reaction not recalled   Norvasc  [amlodipine ]    Leg edema   Tape Other (See Comments)   Leaves the skin raw if left on for a period of time- tolerates paper tape   Penicillin V Rash   Penicillin V Potassium Rash   Penicillins Rash   Has patient had a PCN reaction causing immediate rash, facial/tongue/throat swelling, SOB or lightheadedness with hypotension: Yes Has patient had a PCN reaction causing severe rash involving mucus membranes or skin necrosis: No Has patient had a PCN reaction that required hospitalization No Has patient had a PCN reaction occurring within the last 10 years: Yes If all of the above answers are NO, then may proceed with Cephalosporin use.        Medication List     STOP taking these medications    diclofenac  Sodium 1 % Gel Commonly known as: Voltaren    fluticasone  50 MCG/ACT nasal spray Commonly known as: FLONASE    insulin  regular 100 units/mL injection Commonly known as: NOVOLIN R   ipratropium-albuterol  0.5-2.5 (3) MG/3ML Soln Commonly known as: DUONEB   Lactulose  20 GM/30ML Soln   lidocaine  5 % Commonly known as: Lidoderm    NovoLIN N 100 UNIT/ML injection Generic drug: insulin  NPH Human       TAKE these medications    Accu-Chek Guide Test test strip Generic drug: glucose blood Use as instructed   acetaminophen  500 MG tablet Commonly known as: TYLENOL  Take 500 mg by mouth every 6 (six) hours as needed for mild pain or headache.   aspirin  EC 81 MG tablet Take 81 mg by mouth daily. Swallow whole.   blood glucose meter kit and supplies Kit Accu chek, Dx code E11.65, check 3 times daily   Blood Pressure Kit 1 Device by Does not apply route daily.   carvedilol  12.5 MG tablet Commonly known as: COREG  Take 1 tablet (12.5 mg total) by  mouth 2 (two) times daily with a meal. What changed:  medication strength how much to take   Centrum Silver 50+Women Tabs Take 1 tablet by mouth daily with breakfast.   clopidogrel  75 MG tablet Commonly known as: PLAVIX  Take 1 tablet (75 mg total) by mouth daily.   gabapentin  300 MG capsule Commonly known as: NEURONTIN  Take 1 capsule (300 mg total) by mouth at bedtime. Start taking on: July 11, 2024   hydrALAZINE  100 MG tablet Commonly known as: APRESOLINE  Take 1 tablet (100 mg total) by mouth 3 (three) times daily. What changed: See the new instructions.   insulin  glargine 100 UNIT/ML injection Commonly known as: LANTUS  Inject 0.15 mLs (15 Units total) into the skin daily.   Insulin  Syringe-Needle U-100 30G X 1/2 0.5 ML Misc Use it to give insulin  or check blood glucose, 4 times a day or as covered by the insurance   nystatin  powder Commonly known as: MYCOSTATIN /NYSTOP  Apply  1 Application topically 2 (two) times daily as needed.   rosuvastatin  20 MG tablet Commonly known as: CRESTOR  TAKE 1 TABLET BY MOUTH EVERY DAY   senna-docusate 8.6-50 MG tablet Commonly known as: Senokot-S Take 1 tablet by mouth 2 (two) times daily.   spironolactone  25 MG tablet Commonly known as: ALDACTONE  Take 25 mg by mouth 3 (three) times a week. Monday Wednesday and friday   torsemide  20 MG tablet Commonly known as: DEMADEX  Take 1.5 tablets (30 mg total) by mouth daily.   VITAMIN C PO Take 1 tablet by mouth daily.         Contact information for follow-up providers     Bair, Kalpana, MD Follow up.   Specialty: Family Medicine Why: Hospital follow up Contact information: [address] [place] [address] [phone]              Contact information for after-discharge care     Home Medical Care     [facility]) .   Service: Home Health Services                     Allergies[1]   The results of significant  diagnostics from this hospitalization (including imaging, microbiology, ancillary and laboratory) are listed below for reference.   Consultations:   Procedures/Studies: CT FEMUR RIGHT WO CONTRAST Result Date: 06/06/2024 CLINICAL DATA:  Right lower extremity pain. Concern for stress fracture. EXAM: CT OF THE LOWER RIGHT EXTREMITY WITHOUT CONTRAST TECHNIQUE: Multidetector CT imaging of the right lower extremity was performed according to the standard protocol. RADIATION DOSE REDUCTION: This exam was performed according to the departmental dose-optimization program which includes automated exposure control, adjustment of the mA and/or kV according to patient size and/or use of iterative reconstruction technique. COMPARISON:  Right hip radiograph dated 06/05/2024. FINDINGS: Bones/Joint/Cartilage No acute fracture or dislocation. The bones are osteopenic. Moderate arthritic changes of the right hip and right knee. No joint effusion. Ligaments Suboptimally assessed by CT. Muscles and Tendons No acute findings.  Fatty atrophy of the quadriceps muscles. Soft tissues No acute findings. A 2.6 x 1.6 cm probable Baker's cyst in the posterior knee. IMPRESSION: 1. No acute fracture or dislocation. 2. Moderate arthritic changes of the right hip and right knee. Electronically Signed   By: Vanetta Chou M.D.   On: 06/06/2024 10:07   US  Venous Img Lower Bilateral (DVT) Result Date: 06/06/2024 EXAM: ULTRASOUND DUPLEX OF THE BILATERAL LOWER EXTREMITY VEINS TECHNIQUE: Duplex ultrasound using B-mode/gray scaled imaging and Doppler spectral analysis and color flow was obtained of the deep venous structures of the bilateral lower extremity. COMPARISON: None available. CLINICAL HISTORY: Swelling. FINDINGS: LEFT: The common femoral vein, femoral vein, and popliteal vein demonstrate normal compressibility with normal color flow and spectral analysis. There is no evidence of deep venous thrombosis. The infrapopliteal veins are  suboptimally demonstrated. RIGHT: The common femoral vein, femoral vein, and popliteal vein demonstrate normal compressibility with normal color flow and spectral analysis. There is no evidence of deep venous thrombosis. The infrapopliteal veins are suboptimally demonstrated. IMPRESSION: 1. No evidence of DVT in the visualized veins. 2. Infrapopliteal veins are suboptimally demonstrated bilaterally. Electronically signed by: Evalene Coho MD 06/06/2024 07:09 AM EST RP Workstation: [id number]   MR BRAIN WO CONTRAST Result Date: 06/06/2024 CLINICAL DATA:  Follow-up examination for stroke. EXAM: MRI HEAD WITHOUT CONTRAST TECHNIQUE: Multiplanar, multiecho pulse sequences of the brain and surrounding structures were obtained without intravenous contrast. COMPARISON:  CT from earlier  the same day. FINDINGS: Brain: Cerebral volume within normal limits. Patchy and confluent T2/FLAIR hyperintensity involving the periventricular and deep white matter of both cerebral hemispheres, consistent with chronic small vessel ischemic disease, moderate in nature. Encephalomalacia and gliosis involving the right occipital lobe, consistent with a chronic infarct. No abnormal foci of restricted diffusion to suggest acute or subacute ischemia. Gray-white matter differentiation otherwise maintained. No acute or significant chronic intracranial blood products. No mass lesion, midline shift or mass effect. No hydrocephalus or extra-axial fluid collection. Pituitary gland within normal limits. Vascular: Major intracranial vascular flow voids are maintained. Skull and upper cervical spine: Craniocervical junction within normal limits. Bone marrow signal intensity normal. No scalp soft tissue abnormality. Sinuses/Orbits: Prior bilateral ocular lens replacement. Scattered mucosal thickening present throughout the sphenoid ethmoidal sinuses. No significant mastoid effusion. Other: None. IMPRESSION: 1. No acute intracranial abnormality. 2.  Chronic right occipital infarct. 3. Underlying moderate chronic microvascular ischemic disease. Electronically Signed   By: Morene Hoard M.D.   On: 06/06/2024 00:02   DG HIP UNILAT WITH PELVIS MIN 4 VIEWS RIGHT Result Date: 06/05/2024 CLINICAL DATA:  Hip pain. EXAM: DG HIP (WITH OR WITHOUT PELVIS) 4+V RIGHT COMPARISON:  Reformats from abdominopelvic CT earlier today FINDINGS: Technically limited due to difficulty with positioning. No evidence of acute fracture of the pelvis or right hip. Moderate right hip osteoarthritis. The pubic rami are intact. No pubic symphyseal or sacroiliac diastasis. Vascular calcifications are seen. IMPRESSION: 1. No acute fracture of the pelvis or right hip. 2. Moderate right hip osteoarthritis. Electronically Signed   By: Andrea Gasman M.D.   On: 06/05/2024 20:53   CT ABDOMEN PELVIS WO CONTRAST Result Date: 06/05/2024 CLINICAL DATA:  Trauma pain hypotension EXAM: CT ABDOMEN AND PELVIS WITHOUT CONTRAST TECHNIQUE: Multidetector CT imaging of the abdomen and pelvis was performed following the standard protocol without IV contrast. RADIATION DOSE REDUCTION: This exam was performed according to the departmental dose-optimization program which includes automated exposure control, adjustment of the mA and/or kV according to patient size and/or use of iterative reconstruction technique. COMPARISON:  CT 06/03/2024, 10/22/2022 FINDINGS: Lower chest: Lung bases demonstrate no acute airspace disease. Cardiomegaly with coronary vascular calcification. Hepatobiliary: No focal liver abnormality is seen. Status post cholecystectomy. No biliary dilatation. Pancreas: Unremarkable. No pancreatic ductal dilatation or surrounding inflammatory changes. Spleen: Normal in size without focal abnormality. Adrenals/Urinary Tract: Adrenal glands are normal. Kidneys show no hydronephrosis. Catheter in the bladder. Diffuse thick-walled appearance of bladder and some perivesical stranding. Small  amount of gas in the urinary bladder. Stomach/Bowel: Stomach within normal limits. No dilated small bowel. Diffuse diverticular disease of the colon. Possible areas of wall thickening involving the hepatic flexure and proximal transverse colon. Persistent rectal wall thickening and mild perirectal/presacral edema. Vascular/Lymphatic: Aortic atherosclerosis. No enlarged abdominal or pelvic lymph nodes. Reproductive: Uterus and bilateral adnexa are unremarkable. Other: No ascites or free air. Small fat containing umbilical hernia Musculoskeletal: Multilevel degenerative changes of the spine. Grade 1 anterolisthesis L4 on L5. No acute osseous abnormality. IMPRESSION: 1. Diffuse diverticular disease of the colon. Possible areas of wall thickening involving the hepatic flexure and proximal transverse colon, correlate for colitis of infectious, inflammatory or ischemic etiology. Persistent rectal wall thickening and mild perirectal/presacral edema, suspicious for proctitis. 2. Diffuse thick-walled appearance of the bladder with perivesical stranding, correlate for cystitis. Aortic Atherosclerosis (ICD10-I70.0). Electronically Signed   By: Luke Bun M.D.   On: 06/05/2024 16:43   CT HEAD WO CONTRAST ( ) Result Date: 06/05/2024 CLINICAL DATA:  Head trauma EXAM: CT HEAD WITHOUT CONTRAST TECHNIQUE: Contiguous axial images were obtained from the base of the skull through the vertex without intravenous contrast. RADIATION DOSE REDUCTION: This exam was performed according to the departmental dose-optimization program which includes automated exposure control, adjustment of the mA and/or kV according to patient size and/or use of iterative reconstruction technique. COMPARISON:  CT brain 01/05/2024, MRI 10/19/2023 FINDINGS: Brain: No acute territorial infarction, hemorrhage or intracranial mass. Moderate white matter hypodensity consistent with chronic small vessel ischemic changes. The ventricles are nonenlarged.  Vascular: No hyperdense vessels. Vertebral and carotid vascular calcification Skull: Normal. Negative for fracture or focal lesion. Sinuses/Orbits: Mild mucosal thickening in the sinuses Other: None IMPRESSION: 1. No CT evidence for acute intracranial abnormality. 2. Moderate chronic small vessel ischemic changes of the white matter. Electronically Signed   By: Luke Bun M.D.   On: 06/05/2024 16:35   CT Renal Stone Study Result Date: 06/03/2024 CLINICAL DATA:  Abdominal/flank pain.  Dysuria. EXAM: CT ABDOMEN AND PELVIS WITHOUT CONTRAST TECHNIQUE: Multidetector CT imaging of the abdomen and pelvis was performed following the standard protocol without IV contrast. RADIATION DOSE REDUCTION: This exam was performed according to the departmental dose-optimization program which includes automated exposure control, adjustment of the mA and/or kV according to patient size and/or use of iterative reconstruction technique. COMPARISON:  10/22/2022. FINDINGS: Lower chest: Minimal subpleural scarring in the lung bases. Heart is enlarged. No pericardial or pleural effusion. Distal esophagus is grossly unremarkable. Hepatobiliary: Liver is grossly unremarkable. Cholecystectomy. No unexpected biliary ductal dilatation. Pancreas: Negative. Spleen: Negative. Adrenals/Urinary Tract: Adrenal glands and kidneys are unremarkable. No urinary stones. Ureters are decompressed. Foley catheter and air are seen in a decompressed bladder. Stomach/Bowel: Stomach is unremarkable. Incidentally noted duodenal diverticulum. Small bowel is otherwise unremarkable. Appendectomy. Moderate stool burden. Rectal thickening with mild perirectal inflammatory haziness and presacral edema. Vascular/Lymphatic: Atherosclerotic calcification of the aorta. No pathologically enlarged lymph nodes. Reproductive: Uterus is visualized.  No adnexal mass. Other: No free fluid. Mesenteries and peritoneum are otherwise unremarkable. Elevated left hemidiaphragm.  Musculoskeletal: Degenerative changes in the spine. Minimal grade 1 anterolisthesis of L4 on L5. IMPRESSION: 1. Rectal wall thickening with perirectal haziness and presacral edema, findings indicative of proctitis. 2. Moderate stool burden. 3. No urinary stones or obstruction. 4.  Aortic atherosclerosis (ICD10-I70.0). Electronically Signed   By: Newell Eke M.D.   On: 06/03/2024 11:35      Labs: BNP (last 3 results) Recent Labs    08/03/23 1938 09/29/23 1836 10/09/23 1317  BNP 735.5* 784.0* 223.7*   Basic Metabolic Panel: Recent Labs  Lab 06/08/24 0723 06/09/24 0609 06/11/24 0511 06/13/24 1357  NA 139 138 137 133*  K 3.6 3.1* 3.5 3.7  CL 99 97* 98 97*  CO2 28 28 28 24   GLUCOSE 210* 259* 152* 221*  BUN 19 14 14 15   CREATININE 1.22* 1.23* 1.32* 1.32*  CALCIUM  9.5 9.3 8.9 9.1  MG 1.7 1.5* 1.8  --   PHOS  --  2.4*  --   --    Liver Function Tests: Recent Labs  Lab 06/08/24 0723 06/09/24 0609  AST 25  --   ALT 13  --   ALKPHOS 44  --   BILITOT 0.6  --   PROT 6.7  --   ALBUMIN 3.6 3.7   No results for input(s): LIPASE, AMYLASE in the last 168 hours. No results for input(s): AMMONIA in the last 168 hours. CBC: Recent Labs  Lab 06/08/24 [phone] 06/09/24 [phone]  WBC 7.3 7.7  HGB 11.1* 11.0*  HCT 33.6* 33.8*  MCV 89.1 90.1  PLT 320 322   Cardiac Enzymes: No results for input(s): CKTOTAL, CKMB, CKMBINDEX, TROPONINI in the last 168 hours. BNP: Invalid input(s): POCBNP CBG: Recent Labs  Lab 06/13/24 0754 06/13/24 1152 06/13/24 1609 06/14/24 0739 06/14/24 1201  GLUCAP 228* 285* 111* 173* 204*   D-Dimer No results for input(s): DDIMER in the last 72 hours. Hgb A1c No results for input(s): HGBA1C in the last 72 hours. Lipid Profile No results for input(s): CHOL, HDL, LDLCALC, TRIG, CHOLHDL, LDLDIRECT in the last 72 hours. Thyroid  function studies No results for input(s): TSH, T4TOTAL, T3FREE, THYROIDAB in the last 72  hours.  Invalid input(s): FREET3 Anemia work up No results for input(s): VITAMINB12, FOLATE, FERRITIN, TIBC, IRON, RETICCTPCT in the last 72 hours. Urinalysis    Component Value Date/Time   COLORURINE STRAW (A) 06/05/2024 1448   APPEARANCEUR CLEAR (A) 06/05/2024 1448   APPEARANCEUR Clear 10/22/2018 0844   LABSPEC 1.004 (L) 06/05/2024 1448   PHURINE 5.0 06/05/2024 1448   GLUCOSEU NEGATIVE 06/05/2024 1448   HGBUR MODERATE (A) 06/05/2024 1448   BILIRUBINUR NEGATIVE 06/05/2024 1448   BILIRUBINUR neg 03/17/2024 1449   BILIRUBINUR Negative 10/22/2018 0844   KETONESUR NEGATIVE 06/05/2024 1448   PROTEINUR 30 (A) 06/05/2024 1448   UROBILINOGEN 0.2 03/17/2024 1449   UROBILINOGEN 0.2 03/09/2020 1022   NITRITE NEGATIVE 06/05/2024 1448   LEUKOCYTESUR NEGATIVE 06/05/2024 1448   Sepsis Labs Recent Labs  Lab 06/08/24 0723 06/09/24 0609  WBC 7.3 7.7   Microbiology Recent Results (from the past 240 hours)  Gastrointestinal Panel by PCR , Stool     Status: None   Collection Time: 06/05/24 10:18 PM   Specimen: Stool  Result Value Ref Range Status   Campylobacter species NOT DETECTED NOT DETECTED Final   Plesimonas shigelloides NOT DETECTED NOT DETECTED Final   Salmonella species NOT DETECTED NOT DETECTED Final   Yersinia enterocolitica NOT DETECTED NOT DETECTED Final   Vibrio species NOT DETECTED NOT DETECTED Final   Vibrio cholerae NOT DETECTED NOT DETECTED Final   Enteroaggregative E coli (EAEC) NOT DETECTED NOT DETECTED Final   Enteropathogenic E coli (EPEC) NOT DETECTED NOT DETECTED Final   Enterotoxigenic E coli (ETEC) NOT DETECTED NOT DETECTED Final   Shiga like toxin producing E coli (STEC) NOT DETECTED NOT DETECTED Final   Shigella/Enteroinvasive E coli (EIEC) NOT DETECTED NOT DETECTED Final   Cryptosporidium NOT DETECTED NOT DETECTED Final   Cyclospora cayetanensis NOT DETECTED NOT DETECTED Final   Entamoeba histolytica NOT DETECTED NOT DETECTED Final   Giardia  lamblia NOT DETECTED NOT DETECTED Final   Adenovirus F40/41 NOT DETECTED NOT DETECTED Final   Astrovirus NOT DETECTED NOT DETECTED Final   Norovirus GI/GII NOT DETECTED NOT DETECTED Final   Rotavirus A NOT DETECTED NOT DETECTED Final   Sapovirus (I, II, IV, and V) NOT DETECTED NOT DETECTED Final    Comment: Performed at [facility], [address] [place], [place] [address]  C Difficile Quick Screen w PCR reflex     Status: Abnormal   Collection Time: 06/05/24 10:18 PM   Specimen: Stool  Result Value Ref Range Status   C Diff antigen POSITIVE (A) NEGATIVE Final   C Diff toxin NEGATIVE NEGATIVE Final   C Diff interpretation Results are indeterminate. See PCR results.  Final    Comment: Performed at [facility], [address] [place], [place] [address]  C. Diff by  PCR, Reflexed     Status: None   Collection Time: 06/05/24 10:18 PM  Result Value Ref Range Status   Toxigenic C. Difficile by PCR NEGATIVE NEGATIVE Final    Comment: Patient is colonized with non toxigenic C. difficile. May not need treatment unless significant symptoms are present.   Hypervirulent Strain PRESUMPTIVE NEGATIVE PRESUMPTIVE NEGATIVE Final    Comment: Performed at [facility], [address] [place], [place] [address]     Total time spend on discharging this patient, including the last patient exam, discussing the hospital stay, instructions for ongoing care as it relates to all pertinent caregivers, as well as preparing the medical discharge records, prescriptions, and/or referrals as applicable, is 45 minutes.    Ellouise Haber, MD  [facility] 06/14/2024, 3:00 PM       [1]  Allergies Allergen Reactions   Celexa  [Citalopram ]     Diarrhea upset stomach    Jardiance  [Empagliflozin ] Other (See Comments)    Reaction not recalled   Norvasc  [Amlodipine ]     Leg edema   Tape Other (See Comments)    Leaves the skin raw if left on for a period of time- tolerates  paper tape   Penicillin V Rash   Penicillin V Potassium Rash   Penicillins Rash    Has patient had a PCN reaction causing immediate rash, facial/tongue/throat swelling, SOB or lightheadedness with hypotension: Yes Has patient had a PCN reaction causing severe rash involving mucus membranes or skin necrosis: No Has patient had a PCN reaction that required hospitalization No Has patient had a PCN reaction occurring within the last 10 years: Yes If all of the above answers are NO, then may proceed with Cephalosporin use.    "

## 2024-06-16 ENCOUNTER — Telehealth: Payer: Self-pay

## 2024-06-16 NOTE — Patient Outreach (Signed)
 RNCM - patient requesting reschedule, no issues of concern, not available for visit. Rescheduled.

## 2024-06-22 ENCOUNTER — Inpatient Hospital Stay
Admission: EM | Admit: 2024-06-22 | Discharge: 2024-06-26 | DRG: 683 | Disposition: A | Discharge status: Skilled Nursing Facility | Attending: Internal Medicine | Admitting: Internal Medicine

## 2024-06-22 ENCOUNTER — Emergency Department

## 2024-06-22 ENCOUNTER — Other Ambulatory Visit: Payer: Self-pay

## 2024-06-22 DIAGNOSIS — D472 Monoclonal gammopathy: Secondary | ICD-10-CM | POA: Diagnosis present

## 2024-06-22 DIAGNOSIS — K5730 Diverticulosis of large intestine without perforation or abscess without bleeding: Secondary | ICD-10-CM | POA: Diagnosis present

## 2024-06-22 DIAGNOSIS — E785 Hyperlipidemia, unspecified: Secondary | ICD-10-CM | POA: Diagnosis present

## 2024-06-22 DIAGNOSIS — E1122 Type 2 diabetes mellitus with diabetic chronic kidney disease: Secondary | ICD-10-CM | POA: Diagnosis present

## 2024-06-22 DIAGNOSIS — N179 Acute kidney failure, unspecified: Secondary | ICD-10-CM | POA: Diagnosis not present

## 2024-06-22 DIAGNOSIS — I130 Hypertensive heart and chronic kidney disease with heart failure and stage 1 through stage 4 chronic kidney disease, or unspecified chronic kidney disease: Secondary | ICD-10-CM | POA: Diagnosis present

## 2024-06-22 DIAGNOSIS — R109 Unspecified abdominal pain: Principal | ICD-10-CM

## 2024-06-22 DIAGNOSIS — Z91048 Other nonmedicinal substance allergy status: Secondary | ICD-10-CM

## 2024-06-22 DIAGNOSIS — Z860100 Personal history of colon polyps, unspecified: Secondary | ICD-10-CM

## 2024-06-22 DIAGNOSIS — D72829 Elevated white blood cell count, unspecified: Secondary | ICD-10-CM | POA: Diagnosis present

## 2024-06-22 DIAGNOSIS — Z880 Allergy status to penicillin: Secondary | ICD-10-CM

## 2024-06-22 DIAGNOSIS — Z8249 Family history of ischemic heart disease and other diseases of the circulatory system: Secondary | ICD-10-CM

## 2024-06-22 DIAGNOSIS — E1165 Type 2 diabetes mellitus with hyperglycemia: Secondary | ICD-10-CM | POA: Diagnosis present

## 2024-06-22 DIAGNOSIS — Z6841 Body Mass Index (BMI) 40.0 and over, adult: Secondary | ICD-10-CM

## 2024-06-22 DIAGNOSIS — J9811 Atelectasis: Secondary | ICD-10-CM | POA: Diagnosis present

## 2024-06-22 DIAGNOSIS — K5981 Ogilvie syndrome: Secondary | ICD-10-CM | POA: Diagnosis present

## 2024-06-22 DIAGNOSIS — I2510 Atherosclerotic heart disease of native coronary artery without angina pectoris: Secondary | ICD-10-CM | POA: Diagnosis present

## 2024-06-22 DIAGNOSIS — Z823 Family history of stroke: Secondary | ICD-10-CM

## 2024-06-22 DIAGNOSIS — N184 Chronic kidney disease, stage 4 (severe): Secondary | ICD-10-CM | POA: Diagnosis present

## 2024-06-22 DIAGNOSIS — Z8719 Personal history of other diseases of the digestive system: Secondary | ICD-10-CM

## 2024-06-22 DIAGNOSIS — Z8679 Personal history of other diseases of the circulatory system: Secondary | ICD-10-CM

## 2024-06-22 DIAGNOSIS — Z7902 Long term (current) use of antithrombotics/antiplatelets: Secondary | ICD-10-CM

## 2024-06-22 DIAGNOSIS — Z79899 Other long term (current) drug therapy: Secondary | ICD-10-CM

## 2024-06-22 DIAGNOSIS — I272 Pulmonary hypertension, unspecified: Secondary | ICD-10-CM | POA: Diagnosis present

## 2024-06-22 DIAGNOSIS — Z833 Family history of diabetes mellitus: Secondary | ICD-10-CM

## 2024-06-22 DIAGNOSIS — E7800 Pure hypercholesterolemia, unspecified: Secondary | ICD-10-CM | POA: Diagnosis present

## 2024-06-22 DIAGNOSIS — I5032 Chronic diastolic (congestive) heart failure: Secondary | ICD-10-CM | POA: Diagnosis present

## 2024-06-22 DIAGNOSIS — Z794 Long term (current) use of insulin: Secondary | ICD-10-CM

## 2024-06-22 DIAGNOSIS — Z8673 Personal history of transient ischemic attack (TIA), and cerebral infarction without residual deficits: Secondary | ICD-10-CM

## 2024-06-22 DIAGNOSIS — I10 Essential (primary) hypertension: Secondary | ICD-10-CM

## 2024-06-22 DIAGNOSIS — K567 Ileus, unspecified: Secondary | ICD-10-CM | POA: Diagnosis present

## 2024-06-22 DIAGNOSIS — Z7982 Long term (current) use of aspirin: Secondary | ICD-10-CM

## 2024-06-22 DIAGNOSIS — Z888 Allergy status to other drugs, medicaments and biological substances status: Secondary | ICD-10-CM

## 2024-06-22 DIAGNOSIS — K559 Vascular disorder of intestine, unspecified: Secondary | ICD-10-CM | POA: Diagnosis present

## 2024-06-22 LAB — COMPREHENSIVE METABOLIC PANEL WITH GFR
ALT: 22 U/L (ref 0–44)
AST: 31 U/L (ref 15–41)
Albumin: 4.3 g/dL (ref 3.5–5.0)
Alkaline Phosphatase: 56 U/L (ref 38–126)
Anion gap: 20 — ABNORMAL HIGH (ref 5–15)
BUN: 25 mg/dL — ABNORMAL HIGH (ref 8–23)
CO2: 23 mmol/L (ref 22–32)
Calcium: 10.7 mg/dL — ABNORMAL HIGH (ref 8.9–10.3)
Chloride: 90 mmol/L — ABNORMAL LOW (ref 98–111)
Creatinine, Ser: 1.92 mg/dL — ABNORMAL HIGH (ref 0.44–1.00)
GFR, Estimated: 25 mL/min — ABNORMAL LOW
Glucose, Bld: 361 mg/dL — ABNORMAL HIGH (ref 70–99)
Potassium: 3.5 mmol/L (ref 3.5–5.1)
Sodium: 132 mmol/L — ABNORMAL LOW (ref 135–145)
Total Bilirubin: 1 mg/dL (ref 0.0–1.2)
Total Protein: 8.1 g/dL (ref 6.5–8.1)

## 2024-06-22 LAB — BETA-HYDROXYBUTYRIC ACID: Beta-Hydroxybutyric Acid: 0.76 mmol/L — ABNORMAL HIGH (ref 0.05–0.27)

## 2024-06-22 LAB — BLOOD GAS, VENOUS
Acid-Base Excess: 3.2 mmol/L — ABNORMAL HIGH (ref 0.0–2.0)
Bicarbonate: 30.9 mmol/L — ABNORMAL HIGH (ref 20.0–28.0)
O2 Saturation: 64.4 %
Patient temperature: 37
pCO2, Ven: 60 mmHg (ref 44–60)
pH, Ven: 7.32 (ref 7.25–7.43)
pO2, Ven: 39 mmHg (ref 32–45)

## 2024-06-22 LAB — CBC WITH DIFFERENTIAL/PLATELET
Abs Immature Granulocytes: 0.07 K/uL (ref 0.00–0.07)
Basophils Absolute: 0 K/uL (ref 0.0–0.1)
Basophils Relative: 0 %
Eosinophils Absolute: 0 K/uL (ref 0.0–0.5)
Eosinophils Relative: 0 %
HCT: 38.6 % (ref 36.0–46.0)
Hemoglobin: 12.5 g/dL (ref 12.0–15.0)
Immature Granulocytes: 1 %
Lymphocytes Relative: 23 %
Lymphs Abs: 2.6 K/uL (ref 0.7–4.0)
MCH: 29 pg (ref 26.0–34.0)
MCHC: 32.4 g/dL (ref 30.0–36.0)
MCV: 89.6 fL (ref 80.0–100.0)
Monocytes Absolute: 0.5 K/uL (ref 0.1–1.0)
Monocytes Relative: 4 %
Neutro Abs: 8 K/uL — ABNORMAL HIGH (ref 1.7–7.7)
Neutrophils Relative %: 72 %
Platelets: 304 K/uL (ref 150–400)
RBC: 4.31 MIL/uL (ref 3.87–5.11)
RDW: 13.3 % (ref 11.5–15.5)
Smear Review: NORMAL
WBC: 11.2 K/uL — ABNORMAL HIGH (ref 4.0–10.5)
nRBC: 0 % (ref 0.0–0.2)

## 2024-06-22 LAB — TROPONIN T, HIGH SENSITIVITY
Troponin T High Sensitivity: 62 ng/L — ABNORMAL HIGH (ref 0–19)
Troponin T High Sensitivity: 58 ng/L — ABNORMAL HIGH (ref 0–19)

## 2024-06-22 LAB — LACTIC ACID, PLASMA: Lactic Acid, Venous: 2 mmol/L (ref 0.5–1.9)

## 2024-06-22 LAB — CK: Total CK: 197 U/L (ref 38–234)

## 2024-06-22 LAB — GLUCOSE, CAPILLARY
Glucose-Capillary: 156 mg/dL — ABNORMAL HIGH (ref 70–99)
Glucose-Capillary: 192 mg/dL — ABNORMAL HIGH (ref 70–99)
Glucose-Capillary: 301 mg/dL — ABNORMAL HIGH (ref 70–99)
Glucose-Capillary: 358 mg/dL — ABNORMAL HIGH (ref 70–99)

## 2024-06-22 LAB — APTT: aPTT: 27 s (ref 24–36)

## 2024-06-22 LAB — TYPE AND SCREEN
ABO/RH(D): A POS
Antibody Screen: NEGATIVE

## 2024-06-22 LAB — PROTIME-INR
INR: 1.1 (ref 0.8–1.2)
Prothrombin Time: 15 s (ref 11.4–15.2)

## 2024-06-22 LAB — LIPASE, BLOOD: Lipase: 23 U/L (ref 11–51)

## 2024-06-22 MED ORDER — ACETAMINOPHEN 325 MG PO TABS
650.0000 mg | ORAL_TABLET | Freq: Four times a day (QID) | ORAL | Status: DC | PRN
  Administered 2024-06-22 – 2024-06-25 (×7): 650 mg via ORAL
  Filled 2024-06-22 (×7): qty 2

## 2024-06-22 MED ORDER — ONDANSETRON HCL 4 MG/2ML IJ SOLN
4.0000 mg | Freq: Once | INTRAMUSCULAR | Status: AC
  Administered 2024-06-22: 4 mg via INTRAVENOUS
  Filled 2024-06-22: qty 2

## 2024-06-22 MED ORDER — PANTOPRAZOLE SODIUM 40 MG IV SOLR
40.0000 mg | Freq: Once | INTRAVENOUS | Status: AC
  Administered 2024-06-22: 40 mg via INTRAVENOUS
  Filled 2024-06-22: qty 10

## 2024-06-22 MED ORDER — HYDROMORPHONE HCL 1 MG/ML IJ SOLN
0.5000 mg | Freq: Once | INTRAMUSCULAR | Status: AC
  Administered 2024-06-22: 0.5 mg via INTRAVENOUS
  Filled 2024-06-22: qty 0.5

## 2024-06-22 MED ORDER — OXYCODONE HCL 5 MG PO TABS
5.0000 mg | ORAL_TABLET | ORAL | Status: DC | PRN
  Administered 2024-06-23 – 2024-06-25 (×6): 5 mg via ORAL
  Filled 2024-06-22 (×8): qty 1

## 2024-06-22 MED ORDER — PROCHLORPERAZINE EDISYLATE 10 MG/2ML IJ SOLN
10.0000 mg | INTRAMUSCULAR | Status: DC | PRN
  Filled 2024-06-22: qty 2

## 2024-06-22 MED ORDER — ONDANSETRON HCL 4 MG PO TABS: 4.0000 mg | ORAL_TABLET | Freq: Four times a day (QID) | ORAL | Status: DC | PRN

## 2024-06-22 MED ORDER — SODIUM CHLORIDE 0.9 % IV SOLN: INTRAVENOUS | Status: AC

## 2024-06-22 MED ORDER — METRONIDAZOLE 500 MG/100ML IV SOLN
500.0000 mg | Freq: Three times a day (TID) | INTRAVENOUS | Status: AC
  Administered 2024-06-22 – 2024-06-25 (×9): 500 mg via INTRAVENOUS
  Filled 2024-06-22 (×10): qty 100

## 2024-06-22 MED ORDER — INSULIN GLARGINE 100 UNIT/ML ~~LOC~~ SOLN
15.0000 [IU] | Freq: Every day | SUBCUTANEOUS | Status: DC
  Administered 2024-06-22 – 2024-06-25 (×3): 15 [IU] via SUBCUTANEOUS
  Filled 2024-06-22 (×6): qty 0.15

## 2024-06-22 MED ORDER — CARVEDILOL 12.5 MG PO TABS
12.5000 mg | ORAL_TABLET | Freq: Two times a day (BID) | ORAL | Status: DC
  Administered 2024-06-22 – 2024-06-26 (×9): 12.5 mg via ORAL
  Filled 2024-06-22 (×9): qty 1

## 2024-06-22 MED ORDER — INSULIN ASPART 100 UNIT/ML IJ SOLN: 0.0000 [IU] | Freq: Every day | INTRAMUSCULAR | Status: DC

## 2024-06-22 MED ORDER — ACETAMINOPHEN 650 MG RE SUPP: 650.0000 mg | Freq: Four times a day (QID) | RECTAL | Status: DC | PRN

## 2024-06-22 MED ORDER — GABAPENTIN 300 MG PO CAPS: 300.0000 mg | ORAL_CAPSULE | Freq: Every day | ORAL | Status: DC

## 2024-06-22 MED ORDER — ONDANSETRON HCL 4 MG/2ML IJ SOLN
4.0000 mg | Freq: Four times a day (QID) | INTRAMUSCULAR | Status: DC | PRN
  Administered 2024-06-25: 4 mg via INTRAVENOUS
  Filled 2024-06-22: qty 2

## 2024-06-22 MED ORDER — INSULIN ASPART 100 UNIT/ML IJ SOLN: 0.0000 [IU] | Freq: Three times a day (TID) | INTRAMUSCULAR | Status: DC

## 2024-06-22 MED ORDER — HYDRALAZINE HCL 20 MG/ML IJ SOLN: 10.0000 mg | Freq: Four times a day (QID) | INTRAMUSCULAR | Status: DC | PRN

## 2024-06-22 MED ORDER — FENTANYL CITRATE (PF) 50 MCG/ML IJ SOSY
25.0000 ug | PREFILLED_SYRINGE | INTRAMUSCULAR | Status: DC | PRN
  Administered 2024-06-22 – 2024-06-23 (×3): 25 ug via INTRAVENOUS
  Filled 2024-06-22 (×3): qty 1

## 2024-06-22 MED ORDER — INSULIN ASPART 100 UNIT/ML IJ SOLN
0.0000 [IU] | Freq: Three times a day (TID) | INTRAMUSCULAR | Status: DC
  Administered 2024-06-22: 7 [IU] via SUBCUTANEOUS
  Administered 2024-06-22: 9 [IU] via SUBCUTANEOUS
  Administered 2024-06-23: 2 [IU] via SUBCUTANEOUS
  Administered 2024-06-24: 3 [IU] via SUBCUTANEOUS
  Administered 2024-06-24 – 2024-06-25 (×3): 2 [IU] via SUBCUTANEOUS
  Administered 2024-06-25: 3 [IU] via SUBCUTANEOUS
  Administered 2024-06-26: 2 [IU] via SUBCUTANEOUS
  Filled 2024-06-22: qty 7
  Filled 2024-06-22: qty 9
  Filled 2024-06-22: qty 2
  Filled 2024-06-22: qty 3
  Filled 2024-06-22: qty 2
  Filled 2024-06-22: qty 5
  Filled 2024-06-22: qty 4
  Filled 2024-06-22 (×2): qty 2

## 2024-06-22 MED ORDER — HYDROMORPHONE HCL 1 MG/ML IJ SOLN: 1.0000 mg | Freq: Once | INTRAMUSCULAR | Status: DC

## 2024-06-22 MED ORDER — CIPROFLOXACIN IN D5W 400 MG/200ML IV SOLN
400.0000 mg | INTRAVENOUS | Status: DC
  Administered 2024-06-22 – 2024-06-24 (×3): 400 mg via INTRAVENOUS
  Filled 2024-06-22 (×4): qty 200

## 2024-06-22 MED ORDER — POLYETHYLENE GLYCOL 3350 17 G PO PACK
17.0000 g | PACK | Freq: Every day | ORAL | Status: DC | PRN
  Administered 2024-06-25: 17 g via ORAL
  Filled 2024-06-22: qty 1

## 2024-06-22 MED ORDER — SODIUM CHLORIDE 0.9 % IV BOLUS
1000.0000 mL | Freq: Once | INTRAVENOUS | Status: AC
  Administered 2024-06-22: 1000 mL via INTRAVENOUS

## 2024-06-22 MED ORDER — ENOXAPARIN SODIUM 30 MG/0.3ML IJ SOSY
30.0000 mg | PREFILLED_SYRINGE | INTRAMUSCULAR | Status: DC
  Administered 2024-06-22 – 2024-06-23 (×2): 30 mg via SUBCUTANEOUS
  Filled 2024-06-22 (×2): qty 0.3

## 2024-06-22 NOTE — ED Notes (Signed)
 Notified CCMD of pt placement on cardiac monitor.

## 2024-06-22 NOTE — H&P (Signed)
 "  History and Physical    Alexis Farmer FMW:[id number] DOB: 05/02/1941 DOA: 06/22/2024  DOS: the patient was seen and examined on 06/22/2024  PCP: Abbey Bruckner, MD   Patient coming from: Home  I have personally briefly reviewed patient's old medical records in [facility]  Chief Complaint: Abdominal pain and vomiting blood for 2 days  HPI: Alexis Farmer is a pleasant 84 y.o. female with medical history significant for HTN, DM, CKD, dCHF, severe PAH, PAD, CVA, MGUS, morbid obesity, recent history of ischemic colitis s/p colonoscopy who came into ED complaining of abdominal pain and vomiting for 2 days.  Patient was recently admitted and discharged from the hospital between 06/05/2024 and 06/06/2024 for colitis, cystitis, AKI and right leg weakness.  She underwent colonoscopy on 12/22 which showed single linear ulceration of the [facility] flexure which may represent a small area of ischemia, rest of the colon showed diverticulosis without any signs of bleeding or formation.  AKI was improved after IV fluid.  Today she denies any fever, chills, dysuria, hematuria, hematemesis, melena.  She reports some heartburn and chest discomfort.  Denies any falls or hitting her head.  Denies any confusion.  Overall her history is limited due to her inability to elaborate symptoms.  ED Course: Upon arrival to the ED, patient is found to be in abdominal pain, CT abdomen and pelvis showed disproportionate dilation of colon measuring up to 10.5 cm at the level of cecum.  There is a single 2 mm size focus of air within the left hepatic lobe.  Chest x-ray showed left basilar atelectasis.  Creatinine was 1.92, lactate was 2, white count 11.2 blood glucose were 361.  GI was consulted by ED provider and advised to admit under hospitalist service and they will see the patient.  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  Review of Systems:  ROS  All  other systems negative except as noted in the HPI.  Past Medical History:  Diagnosis Date   Acute on chronic heart failure with preserved ejection fraction (HFpEF) (HCC) 04/01/2023   Anxiety 09/13/2015   Arthritis    Atypical chest pain 01/15/2021   Blackhead 10/24/2021   Bradycardia 07/17/2016   Formatting of this note might be different from the original.  Last Assessment & Plan:   Found to be bradycardic at outside hospital. They stopped metoprolol  and verapamil  and heart rate has been normal since then. Echo appears to have been reassuring at the outside facility. Appears to be doing much better with regards to this. She'll continue to monitor for recurrent symptoms. 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  Last Assessment & Plan:   Continues to have issues with this.  She is following with a specialist and it sounds as though they are planning on an MRI.  Benign exam today.   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 Dennise Deal of this note might be different from the original.  Dr. Dennise      Last Assessment & Plan:   Recheck kidney function today.   Constipation 11/17/2019   Cough 01/25/2016   Formatting of this note might be different from the original.  Last Assessment & Plan:   Minimal nighttime cough. Improves when she washes her pillows consistently. Suspect allergies contributing. She'll monitor.  Diabetes mellitus without complication (HCC)    one elevated reading/ no treatment   Disorder of rotator cuff 06/24/2018   Diverticulitis    Dysuria 03/22/2017   Formatting of this note might be different from the original.  Last Assessment & Plan:   Symptoms concerning for UTI.  Will check urinalysis.   Edema of foot 04/08/2014   Formatting of this note might be different from the original.  Last Assessment & Plan:   Chronic pedal edema. Suspect  venous insufficiency. No orthopnea or shortness of breath. Advised elevation of her legs. Consider compression stockings in the future.   Elevated troponin 12/15/2020   Essential hypertension 04/08/2014   Formatting of this note might be different from the original.  Last Assessment & Plan:   Well-controlled on recheck.  Continue current regimen.   Fall 03/26/2018   Gastroenteritis 08/12/2021   Gastrointestinal hemorrhage 08/03/2015   GI bleed    High cholesterol    History of blood transfusion    Hyperglycemia due to type 2 diabetes mellitus (HCC) 03/11/2015   Hypertension    Hypertensive urgency 12/15/2020   Low back pain 04/08/2014   Morbid obesity (HCC) 04/08/2014   Formatting of this note might be different from the original.  Last Assessment & Plan:   Weight is stable. Discussed diet and exercise at length. Encouraged whatever exercise she can do. Given diet information.   Palpitations 12/15/2020   Peripheral edema 12/22/2019   Postmenopausal bleeding 07/17/2016   Formatting of this note might be different from the original.  Last Assessment & Plan:   Recent D&C. Following with gynecology. Benign findings. Monitor for recurrence.   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  Last Assessment & Plan:   Continues to have issues with this.  She is following with a specialist and it sounds as though they are planning on an MRI.  Benign exam today.   Recurrent falls 08/13/2019   Renal insufficiency    Skin cyst 10/24/2021   Stage 3a chronic kidney disease (HCC) 01/15/2021   Swelling of lower leg 07/19/2016   Vertigo 10/13/2015   Formatting of this note might be different from the original.  Last Assessment & Plan:   No recurrence. Discussed that meclizine  is an as needed medication and that she does not need to take this daily. She will  continue to monitor.    Past Surgical History:  Procedure Laterality Date   APPENDECTOMY     CHOLECYSTECTOMY     COLONOSCOPY N/A 06/08/2024   Procedure: COLONOSCOPY;  Surgeon: Jinny Carmine, MD;  Location: [facility] ENDOSCOPY;  Service: Endoscopy;  Laterality: N/A;   ECTOPIC PREGNANCY SURGERY     EYE SURGERY     bilateral cataracts   EYE SURGERY     02/11/2019 repair hole in right eye    gallbladder      HYSTEROSCOPY WITH D & C N/A 10/26/2016   Procedure: DILATATION AND CURETTAGE /HYSTEROSCOPY;  Surgeon: Verdon Keen, MD;  Location: [facility] ORS;  Service: Gynecology;  Laterality: N/A;   HYSTEROSCOPY WITH D & C N/A 07/07/2018   Procedure: DILATATION AND CURETTAGE /HYSTEROSCOPY;  Surgeon: Verdon Keen, MD;  Location: [facility] ORS;  Service: Gynecology;  Laterality: N/A;   POLYPECTOMY  06/08/2024   Procedure: POLYPECTOMY, INTESTINE;  Surgeon: Jinny Carmine, MD;  Location: [facility] ENDOSCOPY;  Service: Endoscopy;;   RIGHT HEART CATH Bilateral 10/10/2023  Procedure: RIGHT HEART CATH;  Surgeon: Cherrie Toribio SAUNDERS, MD;  Location: [facility] INVASIVE CV LAB;  Service: Cardiovascular;  Laterality: Bilateral;   THYROIDECTOMY, PARTIAL       reports that she has never smoked. She has never used smokeless tobacco. She reports that she does not drink alcohol and does not use drugs.  Allergies[1]  Family History  Problem Relation Age of Onset   Diabetes Mother    Hypertension Mother    Stroke Mother    Diabetes Other    Healthy Father    Diabetes Sister    Heart disease Sister     Prior to Admission medications  Medication Sig Start Date End Date Taking? Authorizing Provider  acetaminophen  (TYLENOL ) 500 MG tablet Take 500 mg by mouth every 6 (six) hours as needed for mild pain or headache.    [provider]  Ascorbic Acid (VITAMIN C PO) Take 1 tablet by mouth daily.    [provider]  aspirin  EC 81 MG tablet Take 81 mg by mouth daily. Swallow whole.    [provider]  blood  glucose meter kit and supplies KIT Accu chek, Dx code E11.65, check 3 times daily 07/26/17   Maribeth Camellia MATSU, MD  Blood Pressure KIT 1 Device by Does not apply route daily. 10/24/21   McLean-Scocuzza, Randine SAILOR, MD  carvedilol  (COREG ) 12.5 MG tablet Take 1 tablet (12.5 mg total) by mouth 2 (two) times daily with a meal. 06/09/24   Kathrin Mignon DASEN, MD  clopidogrel  (PLAVIX ) 75 MG tablet Take 1 tablet (75 mg total) by mouth daily. 02/24/24   Loistine Sober, NP  gabapentin  (NEURONTIN ) 300 MG capsule Take 1 capsule (300 mg total) by mouth at bedtime. 07/11/24   Bair, Kalpana, MD  glucose blood (ACCU-CHEK GUIDE TEST) test strip Use as instructed 04/16/24   Bair, Kalpana, MD  hydrALAZINE  (APRESOLINE ) 100 MG tablet Take 1 tablet (100 mg total) by mouth 3 (three) times daily. 06/08/24   Gonfa, Taye T, MD  insulin  glargine (LANTUS ) 100 UNIT/ML injection Inject 0.15 mLs (15 Units total) into the skin daily. 06/09/24   Kathrin Mignon DASEN, MD  Insulin  Syringe-Needle U-100 30G X 1/2 0.5 ML MISC Use it to give insulin  or check blood glucose, 4 times a day or as covered by the insurance 05/11/24   Bair, Luke, MD  Multiple Vitamins-Minerals (CENTRUM SILVER 50+WOMEN) TABS Take 1 tablet by mouth daily with breakfast.    [provider]  nystatin  (MYCOSTATIN /NYSTOP ) powder Apply 1 Application topically 2 (two) times daily as needed. 05/20/24   Bair, Kalpana, MD  rosuvastatin  (CRESTOR ) 20 MG tablet TAKE 1 TABLET BY MOUTH EVERY DAY 02/23/24   Bair, Kalpana, MD  senna-docusate (SENOKOT-S) 8.6-50 MG tablet Take 1 tablet by mouth 2 (two) times daily. 03/18/24   Narendra, Nischal, MD  spironolactone  (ALDACTONE ) 25 MG tablet Take 25 mg by mouth 3 (three) times a week. Monday Wednesday and friday    [provider]  torsemide  (DEMADEX ) 20 MG tablet Take 1.5 tablets (30 mg total) by mouth daily. 11/15/23 06/05/24  Donette Ellouise LABOR, FNP    Physical Exam: Vitals:   06/22/24 0800 06/22/24 0930 06/22/24 1000 06/22/24 1038   BP: (!) 143/72 (!) 161/74 134/68   Pulse: 89 87 86   Resp: 20 18 18    Temp:    98.4 F (36.9 C)  TempSrc:    Oral  SpO2: 91% 98% 94%   Weight:      Height:  Physical Exam   Constitutional: Alert, awake, calm, comfortable HEENT: Neck supple Respiratory: Clear to auscultation B/L, no wheezing, no rales.  Cardiovascular: Regular rate and rhythm, no murmurs / rubs / gallops. No extremity edema. 2+ pedal pulses. No carotid bruits.  Abdomen: Mild diffuse tenderness mostly on the right side of the abdomen. Musculoskeletal: no clubbing / cyanosis. Good ROM, no contractures. Normal muscle tone.  Skin: no rashes, lesions, ulcers. Neurologic: CN 2-12 grossly intact. Sensation intact, No focal deficit identified Psychiatric: Alert and oriented x 3. Normal mood.    Labs on Admission: I have personally reviewed following labs and imaging studies  CBC: Recent Labs  Lab 06/22/24 0605  WBC 11.2*  NEUTROABS 8.0*  HGB 12.5  HCT 38.6  MCV 89.6  PLT 304   Basic Metabolic Panel: Recent Labs  Lab 06/22/24 0605  NA 132*  K 3.5  CL 90*  CO2 23  GLUCOSE 361*  BUN 25*  CREATININE 1.92*  CALCIUM  10.7*   GFR: Estimated Creatinine Clearance: 24.5 mL/min (A) (by C-G formula based on SCr of 1.92 mg/dL (H)). Liver Function Tests: Recent Labs  Lab 06/22/24 0605  AST 31  ALT 22  ALKPHOS 56  BILITOT 1.0  PROT 8.1  ALBUMIN 4.3   Recent Labs  Lab 06/22/24 0605  LIPASE 23   No results for input(s): AMMONIA in the last 168 hours. Coagulation Profile: Recent Labs  Lab 06/22/24 0605  INR 1.1   Cardiac Enzymes: Recent Labs  Lab 06/22/24 0756  CKTOTAL 197   BNP (last 3 results) Recent Labs    08/03/23 1938 09/29/23 1836 10/09/23 1317  BNP 735.5* 784.0* 223.7*   HbA1C: No results for input(s): HGBA1C in the last 72 hours. CBG: No results for input(s): GLUCAP in the last 168 hours. Lipid Profile: No results for input(s): CHOL, HDL, LDLCALC, TRIG,  CHOLHDL, LDLDIRECT in the last 72 hours. Thyroid  Function Tests: No results for input(s): TSH, T4TOTAL, FREET4, T3FREE, THYROIDAB in the last 72 hours. Anemia Panel: No results for input(s): VITAMINB12, FOLATE, FERRITIN, TIBC, IRON, RETICCTPCT in the last 72 hours. Urine analysis:    Component Value Date/Time   COLORURINE STRAW (A) 06/05/2024 1448   APPEARANCEUR CLEAR (A) 06/05/2024 1448   APPEARANCEUR Clear 10/22/2018 0844   LABSPEC 1.004 (L) 06/05/2024 1448   PHURINE 5.0 06/05/2024 1448   GLUCOSEU NEGATIVE 06/05/2024 1448   HGBUR MODERATE (A) 06/05/2024 1448   BILIRUBINUR NEGATIVE 06/05/2024 1448   BILIRUBINUR neg 03/17/2024 1449   BILIRUBINUR Negative 10/22/2018 0844   KETONESUR NEGATIVE 06/05/2024 1448   PROTEINUR 30 (A) 06/05/2024 1448   UROBILINOGEN 0.2 03/17/2024 1449   UROBILINOGEN 0.2 03/09/2020 1022   NITRITE NEGATIVE 06/05/2024 1448   LEUKOCYTESUR NEGATIVE 06/05/2024 1448    Radiological Exams on Admission: I have personally reviewed images CT ABDOMEN PELVIS WO CONTRAST Result Date: 06/22/2024 CLINICAL DATA:  Abdominal pain, acute, nonlocalized Abdominal pain, post-op. EXAM: CT ABDOMEN AND PELVIS WITHOUT CONTRAST TECHNIQUE: Multidetector CT imaging of the abdomen and pelvis was performed following the standard protocol without IV contrast. RADIATION DOSE REDUCTION: This exam was performed according to the departmental dose-optimization program which includes automated exposure control, adjustment of the mA and/or kV according to patient size and/or use of iterative reconstruction technique. COMPARISON:  CT scan abdomen and pelvis from 06/05/2024. FINDINGS: Lower chest: There are patchy atelectatic changes in the visualized lung bases. No overt consolidation. No pleural effusion. The heart is normal in size. No pericardial effusion. Hepatobiliary: The liver is normal in  size. Non-cirrhotic configuration. No suspicious mass. There is a single 2 mm sized  focus of air within the left hepatic lobe, segment 4A. It is difficult to differentiate whether it is within the portal vein tributary or a bile duct, due to lack of intravenous contrast. No intrahepatic or extrahepatic bile duct dilation. Gallbladder is surgically absent. Pancreas: Unremarkable. No pancreatic ductal dilatation or surrounding inflammatory changes. Spleen: Within normal limits. No focal lesion. Adrenals/Urinary Tract: Adrenal glands are unremarkable. No suspicious renal mass within the limitations of this unenhanced exam. No nephroureterolithiasis or obstructive uropathy. Bilateral vascular calcifications noted. Unremarkable urinary bladder. Stomach/Bowel: There is disproportionate dilation of the colon measuring up to 10.5 cm at the level of cecum. There is unformed liquid stool throughout the colon. No definite transition point seen. No focal obstructing mass seen. 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 No abnormal bowel wall thickening or inflammatory changes. There is small sliding hiatal hernia. There is fluid-filled and dilated visualized lower thoracic esophagus, which is nonspecific but most likely seen in the settings of chronic gastroesophageal reflux disease versus esophageal dysmotility. Appendix is not distinctly visualized. Small bowel loops are nondilated. There are scattered diverticula mainly in the left hemi colon, without imaging signs of diverticulitis. Vascular/Lymphatic: No ascites or pneumoperitoneum. No abdominal or pelvic lymphadenopathy, by size criteria. No aneurysmal dilation of the major abdominal arteries. There are marked peripheral atherosclerotic vascular calcifications of the aorta and its major branches. Reproductive: The uterus is unremarkable. No large adnexal mass. Other: There is a tiny fat containing umbilical hernia. The soft tissues and abdominal wall are otherwise unremarkable. Musculoskeletal: No suspicious osseous lesions. There are  moderate multilevel degenerative changes in the visualized spine. IMPRESSION: 1. There is disproportionate dilation of the colon measuring up to 10.5 cm at the level of cecum. There is unformed liquid stool throughout the colon. No definite transition point seen. No focal obstructing mass seen. 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 2. There is a single 2 mm sized focus of air within the left hepatic lobe, segment 4A. It is difficult to differentiate whether it is within the portal vein tributary or a bile duct, due to lack of intravenous contrast. 3. Multiple other nonacute observations, as described above. Aortic Atherosclerosis (ICD10-I70.0). Electronically Signed   By: Ree Molt M.D.   On: 06/22/2024 09:00   DG Chest Portable 1 View Result Date: 06/22/2024 EXAM: 1 VIEW(S) XRAY OF THE CHEST 06/22/2024 07:46:00 AM COMPARISON: 09/29/2023 CLINICAL HISTORY: sob FINDINGS: LUNGS AND PLEURA: Low lung volumes accentuate pulmonary vasculature. Left basilar atelectasis. No pleural effusion. No pneumothorax. HEART AND MEDIASTINUM: Aortic atherosclerosis. Low lung volumes accentuate the cardiomediastinal silhouette. BONES AND SOFT TISSUES: No acute osseous abnormality. IMPRESSION: 1. Left basilar atelectasis accentuated by low lung volumes. Electronically signed by: Katheleen Faes MD 06/22/2024 08:09 AM EST RP Workstation: [id number]    EKG: My personal interpretation of EKG shows: Sinus rhythm no ST elevation    Assessment/Plan Principal Problem:   AKI (acute kidney injury) Active Problems:   Chronic diastolic CHF (congestive heart failure) (HCC)   Uncontrolled type 2 diabetes mellitus with hyperglycemia, with long-term current use of insulin  (HCC)   Hyperlipidemia   CKD (chronic kidney disease) stage 4, GFR 15-29 ml/min (HCC)   Ileus (HCC)   Ischemic colitis   Essential hypertension    Assessment and Plan:  84 year old female with multiple medical problems including but  not limited to diabetes, HTN, HLD, CHF, history of ischemic colitis, ileus who came  into the hospital for abdominal pain for 2 days duration.  She was recently discharged from being treated for the same.  1.  Ischemic colitis/ileus/Ogilvie's syndrome - She will be admitted to the hospital as inpatient. - She has mild leukocytosis. - Will start her on Cipro  and Flagyl , n.p.o., IV fluid, pain medications. - GI consultation has been requested from ED. - Will see recommendation from GI. - Will continue her Protonix .  2.  Type 2 diabetes - She will be continued on Lantus  12 units at home dose. - Continue insulin  sliding scale - She is n.p.o. for now  3.  HTN/HLD - Will resume her home medications once she is able to take by mouth. - Continue Coreg  for now. - Will give her hydralazine  as needed for systolic blood pressure more than 160.  4.  History of diastolic congestive heart failure - She appears to be dehydrated. - She will be given gentle hydration and hold her diuretics for now  5.  AKI - She has CKD 4 - Will give her gentle hydration for 1 day while she is n.p.o. - Will hold IV fluid once she has symptoms of shortness of breath.  6.  CAD/PAD - Continue to hold aspirin  and Plavix  for now as she may need to go to the OR due to ileus versus obstruction - We may resume later     DVT prophylaxis: Lovenox  Code Status: Full Code Family Communication: None  Disposition Plan: Home  Consults called: GI by EDP  Admission status: Observation, Telemetry bed   Nena Rebel, MD [facility] 06/22/2024, 11:43 AM        [1]  Allergies Allergen Reactions   Celexa  [Citalopram ]     Diarrhea upset stomach    Jardiance  [Empagliflozin ] Other (See Comments)    Reaction not recalled   Norvasc  [Amlodipine ]     Leg edema   Tape Other (See Comments)    Leaves the skin raw if left on for a period of time- tolerates paper tape   Penicillin V Rash   Penicillin V Potassium  Rash   Penicillins Rash    Has patient had a PCN reaction causing immediate rash, facial/tongue/throat swelling, SOB or lightheadedness with hypotension: Yes Has patient had a PCN reaction causing severe rash involving mucus membranes or skin necrosis: No Has patient had a PCN reaction that required hospitalization No Has patient had a PCN reaction occurring within the last 10 years: Yes If all of the above answers are NO, then may proceed with Cephalosporin use.    "

## 2024-06-22 NOTE — ED Notes (Signed)
 Pt provided with pericare, clean dry brief d/t fecal incontinence.

## 2024-06-22 NOTE — ED Provider Notes (Signed)
 "  [facility] Provider Note    Event Date/Time   First MD Initiated Contact with Patient 06/22/24 [phone]     (approximate)   History   Abdominal Pain   HPI  Alexis Farmer is a 84 y.o. female with history of hypertension, diabetes, CKD who comes in with abdominal pain.  I actually saw patient on 12/19 for abdominal pain and she had CT imaging that showed concerns for proctitis as well as possible colitis.  Patient's lactate was normal there was no blood in the stool it was seem less likely ischemic patient was seen by GI who did a colonoscopy  per GI note that I reviewed from dr jinny The patient had a colonoscopy today with a single linear ulceration of the [facility] flexure which may represent a small area of ischemia. The rest of the colon showed diverticulosis without any sign of bleeding or inflammation. There was no signs of any pseudomembranes or inflammation anywhere else in the colon. I am not recommending any further GI procedures for this patient.  Patient reports that after she was discharged she had worsening abdominal pain nausea, vomiting.  She denies knowing there was any blood in it but with EMS they noted blood streaked vomit that was dark in nature.  She reports some heartburn up into her chest. Denies any falls and hitting her head, confusion.   Physical Exam   Triage Vital Signs: ED Triage Vitals  Encounter Vitals Group     BP --      Girls Systolic BP Percentile --      Girls Diastolic BP Percentile --      Boys Systolic BP Percentile --      Boys Diastolic BP Percentile --      Pulse Rate 06/22/24 0552 87     Resp 06/22/24 0552 17     Temp --      Temp src --      SpO2 06/22/24 0551 99 %     Weight --      Height --      Head Circumference --      Peak Flow --      Pain Score --      Pain Loc --      Pain Education --      Exclude from Growth Chart --     Most recent vital signs: Vitals:   06/22/24 0551 06/22/24 0552  Pulse:   87  Resp:  17  SpO2: 99% 99%     General: Awake, no distress.  CV:  Good peripheral perfusion.  Resp:  Normal effort.  Abd:  Positive distention with some fullness noted in her upper abdomen Other:     ED Results / Procedures / Treatments   Labs (all labs ordered are listed, but only abnormal results are displayed) Labs Reviewed  PROTIME-INR  APTT  CBC WITH DIFFERENTIAL/PLATELET  COMPREHENSIVE METABOLIC PANEL WITH GFR  LIPASE, BLOOD  LACTIC ACID, PLASMA  TYPE AND SCREEN  TROPONIN T, HIGH SENSITIVITY     EKG  My interpretation of EKG:  Sinus rate of 89 without any ST elevation or T wave versions with left bundle branch block Similar to prior EKG  RADIOLOGY Pending   PROCEDURES:  Critical Care performed: No  Procedures   MEDICATIONS ORDERED IN ED: Medications  pantoprazole  (PROTONIX ) injection 40 mg (has no administration in time range)  HYDROmorphone  (DILAUDID ) injection 0.5 mg (has no administration in time range)  sodium chloride   0.9 % bolus 1,000 mL (1,000 mLs Intravenous New Bag/Given 06/22/24 0608)  ondansetron  (ZOFRAN ) injection 4 mg (4 mg Intravenous Given 06/22/24 0608)  HYDROmorphone  (DILAUDID ) injection 0.5 mg (0.5 mg Intravenous Given 06/22/24 0609)     IMPRESSION / MDM / ASSESSMENT AND PLAN / ED COURSE  I reviewed the triage vital signs and the nursing notes.   Patient's presentation is most consistent with acute presentation with potential threat to life or bodily function.   Patient comes in with worsening abdominal pain in the setting of recent colonoscopy with some possible ischemia noted as well as now some new fullness in the upper abdomen.  Will recheck blood work.  If kidney function is within range I will do a CT angio as I think patient can benefit from contrasted CT scan to evaluate for blood flow.  Will give a dose of Dilaudid , Protonix .  Will get EKG, cardiac marker to evaluate for ACS.  Given she does report some burning up into her  chest although I suspect it is more likely related to primary abdominal issue.  Patient we have left oncoming team pending blood work and CT imaging and further disposition.   The patient is on the cardiac monitor to evaluate for evidence of arrhythmia and/or significant heart rate changes.      FINAL CLINICAL IMPRESSION(S) / ED DIAGNOSES   Final diagnoses:  Abdominal pain, unspecified abdominal location     Rx / DC Orders   ED Discharge Orders     None        Note:  This document was prepared using Dragon voice recognition software and may include unintentional dictation errors.   Ernest Ronal BRAVO, MD 06/22/24 [phone]  "

## 2024-06-22 NOTE — ED Notes (Signed)
 Pt desatted while sleeping to 79%. Pt placed on 3L via [place]. MD notified.

## 2024-06-22 NOTE — Progress Notes (Signed)
 SCD's on.   Rectal tube in. Immediate release of large amount of gas with tube insertion.

## 2024-06-22 NOTE — ED Notes (Signed)
 Called RT to notify of VBG in the lab

## 2024-06-22 NOTE — ED Notes (Signed)
 Pt returned from CT

## 2024-06-22 NOTE — Progress Notes (Signed)
 Anticoagulation monitoring(Lovenox ):  83yo  F ordered Lovenox  40 mg Q24h    Filed Weights   06/22/24 0555  Weight: 102.6 kg (226 lb 3.1 oz)   BMI 40   Lab Results  Component Value Date   CREATININE 1.92 (H) 06/22/2024   CREATININE 1.32 (H) 06/13/2024   CREATININE 1.32 (H) 06/11/2024   Estimated Creatinine Clearance: 24.5 mL/min (A) (by C-G formula based on SCr of 1.92 mg/dL (H)). Hemoglobin & Hematocrit     Component Value Date/Time   HGB 12.5 06/22/2024 0605   HGB 11.7 (L) 01/14/2024 0943   HCT 38.6 06/22/2024 0605     Per Protocol for Patient with estCrcl < 30 ml/min, will transition to Lovenox  30 mg Q24h       Allean Haas PharmD Clinical Pharmacist 06/22/2024

## 2024-06-22 NOTE — Progress Notes (Signed)
 PHARMACY NOTE:  ANTIMICROBIAL RENAL DOSAGE ADJUSTMENT  Current antimicrobial regimen includes a mismatch between antimicrobial dosage and estimated renal function.  As per policy approved by the Pharmacy & Therapeutics and Medical Executive Committees, the antimicrobial dosage will be adjusted accordingly.  Current antimicrobial dosage:  Cipro  400 mg iv q 12 hours   Indication: intra-abdomina infection  Renal Function:  Estimated Creatinine Clearance: 24.5 mL/min (A) (by C-G formula based on SCr of 1.92 mg/dL (H)). []      On intermittent HD, scheduled: []      On CRRT    Antimicrobial dosage has been changed to:  Cipro  400 mg IV q 24 hours  Additional comments:   Thank you for allowing pharmacy to be a part of this patient's care.  Bari Glendia BIRCH, [facility] 06/22/2024 11:47 AM

## 2024-06-22 NOTE — ED Triage Notes (Signed)
 Pt arrives from home via [facility].  C/o abdominal pain, vomiting blood x 2 days.  Extensive abdominal HX. Black tarry gritty emesis noted by EMS.PT A&Ox4.

## 2024-06-22 NOTE — ED Provider Notes (Signed)
 CT ABDOMEN PELVIS WO CONTRAST Result Date: 06/22/2024 CLINICAL DATA:  Abdominal pain, acute, nonlocalized Abdominal pain, post-op. EXAM: CT ABDOMEN AND PELVIS WITHOUT CONTRAST TECHNIQUE: Multidetector CT imaging of the abdomen and pelvis was performed following the standard protocol without IV contrast. RADIATION DOSE REDUCTION: This exam was performed according to the departmental dose-optimization program which includes automated exposure control, adjustment of the mA and/or kV according to patient size and/or use of iterative reconstruction technique. COMPARISON:  CT scan abdomen and pelvis from 06/05/2024. FINDINGS: Lower chest: There are patchy atelectatic changes in the visualized lung bases. No overt consolidation. No pleural effusion. The heart is normal in size. No pericardial effusion. Hepatobiliary: The liver is normal in size. Non-cirrhotic configuration. No suspicious mass. There is a single 2 mm sized focus of air within the left hepatic lobe, segment 4A. It is difficult to differentiate whether it is within the portal vein tributary or a bile duct, due to lack of intravenous contrast. No intrahepatic or extrahepatic bile duct dilation. Gallbladder is surgically absent. Pancreas: Unremarkable. No pancreatic ductal dilatation or surrounding inflammatory changes. Spleen: Within normal limits. No focal lesion. Adrenals/Urinary Tract: Adrenal glands are unremarkable. No suspicious renal mass within the limitations of this unenhanced exam. No nephroureterolithiasis or obstructive uropathy. Bilateral vascular calcifications noted. Unremarkable urinary bladder. Stomach/Bowel: There is disproportionate dilation of the colon measuring up to 10.5 cm at the level of cecum. There is unformed liquid stool throughout the colon. No definite transition point seen. No focal obstructing mass seen. 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 No abnormal bowel wall thickening or inflammatory changes.  There is small sliding hiatal hernia. There is fluid-filled and dilated visualized lower thoracic esophagus, which is nonspecific but most likely seen in the settings of chronic gastroesophageal reflux disease versus esophageal dysmotility. Appendix is not distinctly visualized. Small bowel loops are nondilated. There are scattered diverticula mainly in the left hemi colon, without imaging signs of diverticulitis. Vascular/Lymphatic: No ascites or pneumoperitoneum. No abdominal or pelvic lymphadenopathy, by size criteria. No aneurysmal dilation of the major abdominal arteries. There are marked peripheral atherosclerotic vascular calcifications of the aorta and its major branches. Reproductive: The uterus is unremarkable. No large adnexal mass. Other: There is a tiny fat containing umbilical hernia. The soft tissues and abdominal wall are otherwise unremarkable. Musculoskeletal: No suspicious osseous lesions. There are moderate multilevel degenerative changes in the visualized spine. IMPRESSION: 1. There is disproportionate dilation of the colon measuring up to 10.5 cm at the level of cecum. There is unformed liquid stool throughout the colon. No definite transition point seen. No focal obstructing mass seen. 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 2. There is a single 2 mm sized focus of air within the left hepatic lobe, segment 4A. It is difficult to differentiate whether it is within the portal vein tributary or a bile duct, due to lack of intravenous contrast. 3. Multiple other nonacute observations, as described above. Aortic Atherosclerosis (ICD10-I70.0). Electronically Signed   By: Ree Molt M.D.   On: 06/22/2024 09:00   DG Chest Portable 1 View Result Date: 06/22/2024 EXAM: 1 VIEW(S) XRAY OF THE CHEST 06/22/2024 07:46:00 AM COMPARISON: 09/29/2023 CLINICAL HISTORY: sob FINDINGS: LUNGS AND PLEURA: Low lung volumes accentuate pulmonary vasculature. Left basilar atelectasis. No  pleural effusion. No pneumothorax. HEART AND MEDIASTINUM: Aortic atherosclerosis. Low lung volumes accentuate the cardiomediastinal silhouette. BONES AND SOFT TISSUES: No acute osseous abnormality. IMPRESSION: 1. Left basilar atelectasis accentuated by low lung volumes. Electronically signed by: Dayne  Hassell MD 06/22/2024 08:09 AM EST RP Workstation: [id number]     Admitted to Dr. Roann, Dr. Jinny advising will consult  Patient agreeable with admission.    Dicky Anes, MD 06/22/24 1013

## 2024-06-22 NOTE — Consult Note (Signed)
 "      Alexis Copping, MD [facility]  [address], [address] [place], [place] [address] Phone: [phone] Fax : [phone]  Consultation  Referring Provider:     Dr. Dicky Primary Care Physician:  Abbey Bruckner, MD Primary Gastroenterologist:  Dr. Therisa         Reason for Consultation:     Abdominal distention  Date of Admission:  06/22/2024 Date of Consultation:  06/22/2024         HPI:   Alexis Farmer is a 84 y.o. female Who was recently in the hospital at the mid to end of December with abdominal pain.  The patient has a history of hypertension hyperlipidemia diabetes chronic kidney disease and a history of strokes.  The patient had a colonoscopy on last admission with findings consistent with ischemic colitis.  The patient now reports that she has been having abdominal distention with bloating for the last 2 to 3 days with diarrhea starting at the same time.  The patient had a CT scan that showed:  IMPRESSION: 1. There is disproportionate dilation of the colon measuring up to 10.5 cm at the level of cecum. There is unformed liquid stool throughout the colon. No definite transition point seen. No focal obstructing mass seen. 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 2. There is a single 2 mm sized focus of air within the left hepatic lobe, segment 4A. It is difficult to differentiate whether it is within the portal vein tributary or a bile duct, due to lack of intravenous contrast. 3. Multiple other nonacute observations, as described above. Aortic Atherosclerosis   The patient reports that she has also problems with urination and states that she is not putting out as much urine as she thinks she should.   Past Medical History:  Diagnosis Date   Acute on chronic heart failure with preserved ejection fraction (HFpEF) (HCC) 04/01/2023   Anxiety 09/13/2015   Arthritis    Atypical chest pain 01/15/2021   Blackhead 10/24/2021   Bradycardia 07/17/2016    Formatting of this note might be different from the original.  Last Assessment & Plan:   Found to be bradycardic at outside hospital. They stopped metoprolol  and verapamil  and heart rate has been normal since then. Echo appears to have been reassuring at the outside facility. Appears to be doing much better with regards to this. She'll continue to monitor for recurrent symptoms. 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  Last Assessment & Plan:   Continues to have issues with this.  She is following with a specialist and it sounds as though they are planning on an MRI.  Benign exam today.   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 Dennise Deal of this note might be different from the original.  Dr. Dennise      Last Assessment & Plan:   Recheck kidney function today.   Constipation 11/17/2019   Cough 01/25/2016   Formatting of this note might be different from the original.  Last Assessment & Plan:   Minimal nighttime cough. Improves when she washes her pillows consistently. Suspect allergies contributing. She'll monitor.   Diabetes mellitus without complication (HCC)    one elevated reading/ no treatment   Disorder of rotator cuff 06/24/2018   Diverticulitis    Dysuria 03/22/2017   Formatting of this note  might be different from the original.  Last Assessment & Plan:   Symptoms concerning for UTI.  Will check urinalysis.   Edema of foot 04/08/2014   Formatting of this note might be different from the original.  Last Assessment & Plan:   Chronic pedal edema. Suspect venous insufficiency. No orthopnea or shortness of breath. Advised elevation of her legs. Consider compression stockings in the future.   Elevated troponin 12/15/2020   Essential hypertension 04/08/2014   Formatting of this note might be different from the original.  Last Assessment & Plan:   Well-controlled  on recheck.  Continue current regimen.   Fall 03/26/2018   Gastroenteritis 08/12/2021   Gastrointestinal hemorrhage 08/03/2015   GI bleed    High cholesterol    History of blood transfusion    Hyperglycemia due to type 2 diabetes mellitus (HCC) 03/11/2015   Hypertension    Hypertensive urgency 12/15/2020   Low back pain 04/08/2014   Morbid obesity (HCC) 04/08/2014   Formatting of this note might be different from the original.  Last Assessment & Plan:   Weight is stable. Discussed diet and exercise at length. Encouraged whatever exercise she can do. Given diet information.   Palpitations 12/15/2020   Peripheral edema 12/22/2019   Postmenopausal bleeding 07/17/2016   Formatting of this note might be different from the original.  Last Assessment & Plan:   Recent D&C. Following with gynecology. Benign findings. Monitor for recurrence.   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  Last Assessment & Plan:   Continues to have issues with this.  She is following with a specialist and it sounds as though they are planning on an MRI.  Benign exam today.   Recurrent falls 08/13/2019   Renal insufficiency    Skin cyst 10/24/2021   Stage 3a chronic kidney disease (HCC) 01/15/2021   Swelling of lower leg 07/19/2016   Vertigo 10/13/2015   Formatting of this note might be different from the original.  Last Assessment & Plan:   No recurrence. Discussed that meclizine  is an as needed medication and that she does not need to take this daily. She will continue to monitor.    Past Surgical History:  Procedure Laterality Date   APPENDECTOMY     CHOLECYSTECTOMY     COLONOSCOPY N/A 06/08/2024   Procedure: COLONOSCOPY;  Surgeon: Jinny Carmine, MD;  Location: [facility] END[facility]COPY;  Service: Endoscopy;  Laterality: N/A;   ECTOPIC PREGNANCY SURGERY     EYE SURGERY      bilateral cataracts   EYE SURGERY     02/11/2019 repair hole in right eye    gallbladder      HYSTEROSCOPY WITH D & C N/A 10/26/2016   Procedure: DILATATION AND CURETTAGE /HYSTEROSCOPY;  Surgeon: Verdon Keen, MD;  Location: [facility] ORS;  Service: Gynecology;  Laterality: N/A;   HYSTEROSCOPY WITH D & C N/A 07/07/2018   Procedure: DILATATION AND CURETTAGE /HYSTEROSCOPY;  Surgeon: Verdon Keen, MD;  Location: [facility] ORS;  Service: Gynecology;  Laterality: N/A;   POLYPECTOMY  06/08/2024   Procedure: POLYPECTOMY, INTESTINE;  Surgeon: Jinny Carmine, MD;  Location: [facility] ENDOSCOPY;  Service: Endoscopy;;   RIGHT HEART CATH Bilateral 10/10/2023   Procedure: RIGHT HEART CATH;  Surgeon: Cherrie Toribio SAUNDERS, MD;  Location: [facility] INVASIVE CV LAB;  Service: Cardiovascular;  Laterality: Bilateral;   THYROIDECTOMY, PARTIAL      Prior  to Admission medications  Medication Sig Start Date End Date Taking? Authorizing Provider  acetaminophen  (TYLENOL ) 500 MG tablet Take 500 mg by mouth every 6 (six) hours as needed for mild pain or headache.    [provider]  Ascorbic Acid (VITAMIN C PO) Take 1 tablet by mouth daily.    [provider]  aspirin  EC 81 MG tablet Take 81 mg by mouth daily. Swallow whole.    [provider]  blood glucose meter kit and supplies KIT Accu chek, Dx code E11.65, check 3 times daily 07/26/17   Maribeth Camellia MATSU, MD  Blood Pressure KIT 1 Device by Does not apply route daily. 10/24/21   McLean-Scocuzza, Randine SAILOR, MD  carvedilol  (COREG ) 12.5 MG tablet Take 1 tablet (12.5 mg total) by mouth 2 (two) times daily with a meal. 06/09/24   Kathrin Mignon DASEN, MD  clopidogrel  (PLAVIX ) 75 MG tablet Take 1 tablet (75 mg total) by mouth daily. 02/24/24   Loistine Sober, NP  gabapentin  (NEURONTIN ) 300 MG capsule Take 1 capsule (300 mg total) by mouth at bedtime. 07/11/24   Bair, Kalpana, MD  glucose blood (ACCU-CHEK GUIDE TEST) test strip Use as instructed 04/16/24   Bair, Kalpana,  MD  hydrALAZINE  (APRESOLINE ) 100 MG tablet Take 1 tablet (100 mg total) by mouth 3 (three) times daily. 06/08/24   Gonfa, Taye T, MD  insulin  glargine (LANTUS ) 100 UNIT/ML injection Inject 0.15 mLs (15 Units total) into the skin daily. 06/09/24   Kathrin Mignon DASEN, MD  Insulin  Syringe-Needle U-100 30G X 1/2 0.5 ML MISC Use it to give insulin  or check blood glucose, 4 times a day or as covered by the insurance 05/11/24   Bair, Luke, MD  Multiple Vitamins-Minerals (CENTRUM SILVER 50+WOMEN) TABS Take 1 tablet by mouth daily with breakfast.    [provider]  nystatin  (MYCOSTATIN /NYSTOP ) powder Apply 1 Application topically 2 (two) times daily as needed. 05/20/24   Bair, Kalpana, MD  rosuvastatin  (CRESTOR ) 20 MG tablet TAKE 1 TABLET BY MOUTH EVERY DAY 02/23/24   Bair, Kalpana, MD  senna-docusate (SENOKOT-S) 8.6-50 MG tablet Take 1 tablet by mouth 2 (two) times daily. 03/18/24   Narendra, Nischal, MD  spironolactone  (ALDACTONE ) 25 MG tablet Take 25 mg by mouth 3 (three) times a week. Monday Wednesday and friday    [provider]  torsemide  (DEMADEX ) 20 MG tablet Take 1.5 tablets (30 mg total) by mouth daily. 11/15/23 06/05/24  Donette Ellouise LABOR, FNP    Family History  Problem Relation Age of Onset   Diabetes Mother    Hypertension Mother    Stroke Mother    Diabetes Other    Healthy Father    Diabetes Sister    Heart disease Sister      Social History[1]  Allergies as of 06/22/2024 - Review Complete 06/22/2024  Allergen Reaction Noted   Celexa  [citalopram ]  12/29/2020   Jardiance  [empagliflozin ] Other (See Comments) 08/25/2020   Norvasc  [amlodipine ]  04/19/2021   Tape Other (See Comments) 12/15/2020   Penicillin v Rash 08/15/2020   Penicillin v potassium Rash 07/10/2022   Penicillins Rash 03/25/2014    Review of Systems:    All systems reviewed and negative except where noted in HPI.   Physical Exam:  Vital signs in last 24 hours: Temp:  [97.8 F (36.6 C)-98.4 F  (36.9 C)] 97.8 F (36.6 C) (01/05 1145) Pulse Rate:  [82-93] 82 (01/05 1145) Resp:  [17-26] 18 (01/05 1145) BP: (101-161)/(60-83) 142/67 (01/05 1145) SpO2:  [  80 %-99 %] 90 % (01/05 1145) Weight:  [102.6 kg] 102.6 kg (01/05 0555)   General:   Pleasant, cooperative in NAD Head:  Normocephalic and atraumatic. Eyes:   No icterus.   Conjunctiva pink. PERRLA. Ears:  Normal auditory acuity. Neck:  Supple; no masses or thyroidomegaly Lungs: Respirations even and unlabored. Lungs clear to auscultation bilaterally.   No wheezes, crackles, or rhonchi.  Heart:  Regular rate and rhythm;  Without murmur, clicks, rubs or gallops Abdomen:  Soft, positive patient, diffuse mild tenderness. Normal bowel sounds. No appreciable masses or hepatomegaly.  No rebound or guarding.  Rectal:  Not performed. Msk:  Symmetrical without gross deformities.    Extremities:  Without edema, cyanosis or clubbing. Neurologic:  Alert and oriented x3;  grossly normal neurologically. Skin:  Intact without significant lesions or rashes. Cervical Nodes:  No significant cervical adenopathy. Psych:  Alert and cooperative. Normal affect.  LAB RESULTS: Recent Labs    06/22/24 0605  WBC 11.2*  HGB 12.5  HCT 38.6  PLT 304   BMET Recent Labs    06/22/24 0605  NA 132*  K 3.5  CL 90*  CO2 23  GLUCOSE 361*  BUN 25*  CREATININE 1.92*  CALCIUM  10.7*   LFT Recent Labs    06/22/24 0605  PROT 8.1  ALBUMIN 4.3  AST 31  ALT 22  ALKPHOS 56  BILITOT 1.0   PT/INR Recent Labs    06/22/24 0605  LABPROT 15.0  INR 1.1    STUDIES: CT ABDOMEN PELVIS WO CONTRAST Result Date: 06/22/2024 CLINICAL DATA:  Abdominal pain, acute, nonlocalized Abdominal pain, post-op. EXAM: CT ABDOMEN AND PELVIS WITHOUT CONTRAST TECHNIQUE: Multidetector CT imaging of the abdomen and pelvis was performed following the standard protocol without IV contrast. RADIATION DOSE REDUCTION: This exam was performed according to the departmental  dose-optimization program which includes automated exposure control, adjustment of the mA and/or kV according to patient size and/or use of iterative reconstruction technique. COMPARISON:  CT scan abdomen and pelvis from 06/05/2024. FINDINGS: Lower chest: There are patchy atelectatic changes in the visualized lung bases. No overt consolidation. No pleural effusion. The heart is normal in size. No pericardial effusion. Hepatobiliary: The liver is normal in size. Non-cirrhotic configuration. No suspicious mass. There is a single 2 mm sized focus of air within the left hepatic lobe, segment 4A. It is difficult to differentiate whether it is within the portal vein tributary or a bile duct, due to lack of intravenous contrast. No intrahepatic or extrahepatic bile duct dilation. Gallbladder is surgically absent. Pancreas: Unremarkable. No pancreatic ductal dilatation or surrounding inflammatory changes. Spleen: Within normal limits. No focal lesion. Adrenals/Urinary Tract: Adrenal glands are unremarkable. No suspicious renal mass within the limitations of this unenhanced exam. No nephroureterolithiasis or obstructive uropathy. Bilateral vascular calcifications noted. Unremarkable urinary bladder. Stomach/Bowel: There is disproportionate dilation of the colon measuring up to 10.5 cm at the level of cecum. There is unformed liquid stool throughout the colon. No definite transition point seen. No focal obstructing mass seen. 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 No abnormal bowel wall thickening or inflammatory changes. There is small sliding hiatal hernia. There is fluid-filled and dilated visualized lower thoracic esophagus, which is nonspecific but most likely seen in the settings of chronic gastroesophageal reflux disease versus esophageal dysmotility. Appendix is not distinctly visualized. Small bowel loops are nondilated. There are scattered diverticula mainly in the left hemi colon, without  imaging signs of diverticulitis. Vascular/Lymphatic: No ascites or pneumoperitoneum.  No abdominal or pelvic lymphadenopathy, by size criteria. No aneurysmal dilation of the major abdominal arteries. There are marked peripheral atherosclerotic vascular calcifications of the aorta and its major branches. Reproductive: The uterus is unremarkable. No large adnexal mass. Other: There is a tiny fat containing umbilical hernia. The soft tissues and abdominal wall are otherwise unremarkable. Musculoskeletal: No suspicious osseous lesions. There are moderate multilevel degenerative changes in the visualized spine. IMPRESSION: 1. There is disproportionate dilation of the colon measuring up to 10.5 cm at the level of cecum. There is unformed liquid stool throughout the colon. No definite transition point seen. No focal obstructing mass seen. 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 2. There is a single 2 mm sized focus of air within the left hepatic lobe, segment 4A. It is difficult to differentiate whether it is within the portal vein tributary or a bile duct, due to lack of intravenous contrast. 3. Multiple other nonacute observations, as described above. Aortic Atherosclerosis (ICD10-I70.0). Electronically Signed   By: Ree Molt M.D.   On: 06/22/2024 09:00   DG Chest Portable 1 View Result Date: 06/22/2024 EXAM: 1 VIEW(S) XRAY OF THE CHEST 06/22/2024 07:46:00 AM COMPARISON: 09/29/2023 CLINICAL HISTORY: sob FINDINGS: LUNGS AND PLEURA: Low lung volumes accentuate pulmonary vasculature. Left basilar atelectasis. No pleural effusion. No pneumothorax. HEART AND MEDIASTINUM: Aortic atherosclerosis. Low lung volumes accentuate the cardiomediastinal silhouette. BONES AND SOFT TISSUES: No acute osseous abnormality. IMPRESSION: 1. Left basilar atelectasis accentuated by low lung volumes. Electronically signed by: Katheleen Faes MD 06/22/2024 08:09 AM EST RP Workstation: [id number]      Impression /  Plan:   Assessment: Principal Problem:   AKI (acute kidney injury) Active Problems:   Uncontrolled type 2 diabetes mellitus with hyperglycemia, with long-term current use of insulin  (HCC)   Hyperlipidemia   CKD (chronic kidney disease) stage 4, GFR 15-29 ml/min (HCC)   Chronic diastolic CHF (congestive heart failure) (HCC)   Ileus (HCC)   Ischemic colitis   Essential hypertension   Misaki Quattrone is a 84 y.o. y/o female with who had a colonoscopy with ischemic colitis in the [facility] flexure a few weeks ago who comes in now with colonic ileus with a finding of a possible foci of air in the left hepatic lobe which can be seen with ischemic colitis.  The patient's colonic distention will exacerbate the blood flow by distending the colon and thinning the walls.  Plan:  At this point colonic decompression with a rectal tube will be tried.  If the patient does not improve and needs colonic decompression with a scope then we will proceed with that.  I have ordered the rectal tube and will also order a KUB for tomorrow to see if there is any improvement.  Please consult surgery if there is any sign of perforation such as fevers chills hypotension or worsening abdominal pain.  Avoid narcotics which can also decrease colonic motility.  Thank you for involving me in the care of this patient.      LOS: 0 days   Alexis Copping, MD, MD. NOLIA 06/22/2024, 12:47 PM,  Pager [phone] 7am-5pm  Check AMION for 5pm -7am coverage and on weekends   Note: This dictation was prepared with Dragon dictation along with smaller phrase technology. Any transcriptional errors that result from this process are unintentional.       [1]  Social History Tobacco Use   Smoking status: Never   Smokeless tobacco: Never  Vaping Use   Vaping status: Never  Used  Substance Use Topics   Alcohol use: No   Drug use: No   "

## 2024-06-23 ENCOUNTER — Observation Stay

## 2024-06-23 ENCOUNTER — Ambulatory Visit: Admitting: Physician Assistant

## 2024-06-23 ENCOUNTER — Ambulatory Visit

## 2024-06-23 DIAGNOSIS — K559 Vascular disorder of intestine, unspecified: Secondary | ICD-10-CM | POA: Diagnosis present

## 2024-06-23 DIAGNOSIS — R109 Unspecified abdominal pain: Secondary | ICD-10-CM | POA: Diagnosis not present

## 2024-06-23 DIAGNOSIS — E1165 Type 2 diabetes mellitus with hyperglycemia: Secondary | ICD-10-CM

## 2024-06-23 DIAGNOSIS — K5981 Ogilvie syndrome: Secondary | ICD-10-CM

## 2024-06-23 DIAGNOSIS — Z7902 Long term (current) use of antithrombotics/antiplatelets: Secondary | ICD-10-CM | POA: Diagnosis not present

## 2024-06-23 DIAGNOSIS — Z833 Family history of diabetes mellitus: Secondary | ICD-10-CM | POA: Diagnosis not present

## 2024-06-23 DIAGNOSIS — I272 Pulmonary hypertension, unspecified: Secondary | ICD-10-CM | POA: Diagnosis present

## 2024-06-23 DIAGNOSIS — Z8249 Family history of ischemic heart disease and other diseases of the circulatory system: Secondary | ICD-10-CM | POA: Diagnosis not present

## 2024-06-23 DIAGNOSIS — D472 Monoclonal gammopathy: Secondary | ICD-10-CM | POA: Diagnosis present

## 2024-06-23 DIAGNOSIS — K5939 Other megacolon: Secondary | ICD-10-CM

## 2024-06-23 DIAGNOSIS — D72829 Elevated white blood cell count, unspecified: Secondary | ICD-10-CM | POA: Diagnosis present

## 2024-06-23 DIAGNOSIS — K5730 Diverticulosis of large intestine without perforation or abscess without bleeding: Secondary | ICD-10-CM | POA: Diagnosis present

## 2024-06-23 DIAGNOSIS — R1084 Generalized abdominal pain: Secondary | ICD-10-CM | POA: Diagnosis not present

## 2024-06-23 DIAGNOSIS — I5032 Chronic diastolic (congestive) heart failure: Secondary | ICD-10-CM

## 2024-06-23 DIAGNOSIS — Z794 Long term (current) use of insulin: Secondary | ICD-10-CM | POA: Diagnosis not present

## 2024-06-23 DIAGNOSIS — I130 Hypertensive heart and chronic kidney disease with heart failure and stage 1 through stage 4 chronic kidney disease, or unspecified chronic kidney disease: Secondary | ICD-10-CM | POA: Diagnosis present

## 2024-06-23 DIAGNOSIS — I10 Essential (primary) hypertension: Secondary | ICD-10-CM | POA: Diagnosis not present

## 2024-06-23 DIAGNOSIS — N184 Chronic kidney disease, stage 4 (severe): Secondary | ICD-10-CM | POA: Diagnosis present

## 2024-06-23 DIAGNOSIS — Z8679 Personal history of other diseases of the circulatory system: Secondary | ICD-10-CM

## 2024-06-23 DIAGNOSIS — E7800 Pure hypercholesterolemia, unspecified: Secondary | ICD-10-CM | POA: Diagnosis present

## 2024-06-23 DIAGNOSIS — Z7982 Long term (current) use of aspirin: Secondary | ICD-10-CM | POA: Diagnosis not present

## 2024-06-23 DIAGNOSIS — E1122 Type 2 diabetes mellitus with diabetic chronic kidney disease: Secondary | ICD-10-CM | POA: Diagnosis present

## 2024-06-23 DIAGNOSIS — K567 Ileus, unspecified: Secondary | ICD-10-CM | POA: Diagnosis not present

## 2024-06-23 DIAGNOSIS — N179 Acute kidney failure, unspecified: Secondary | ICD-10-CM

## 2024-06-23 DIAGNOSIS — E785 Hyperlipidemia, unspecified: Secondary | ICD-10-CM

## 2024-06-23 DIAGNOSIS — J9811 Atelectasis: Secondary | ICD-10-CM | POA: Diagnosis present

## 2024-06-23 DIAGNOSIS — Z6841 Body Mass Index (BMI) 40.0 and over, adult: Secondary | ICD-10-CM | POA: Diagnosis not present

## 2024-06-23 DIAGNOSIS — I2510 Atherosclerotic heart disease of native coronary artery without angina pectoris: Secondary | ICD-10-CM | POA: Diagnosis present

## 2024-06-23 LAB — COMPREHENSIVE METABOLIC PANEL WITH GFR
ALT: 16 U/L (ref 0–44)
AST: 21 U/L (ref 15–41)
Albumin: 3.5 g/dL (ref 3.5–5.0)
Alkaline Phosphatase: 46 U/L (ref 38–126)
Anion gap: 15 (ref 5–15)
BUN: 40 mg/dL — ABNORMAL HIGH (ref 8–23)
CO2: 26 mmol/L (ref 22–32)
Calcium: 9.2 mg/dL (ref 8.9–10.3)
Chloride: 95 mmol/L — ABNORMAL LOW (ref 98–111)
Creatinine, Ser: 2.39 mg/dL — ABNORMAL HIGH (ref 0.44–1.00)
GFR, Estimated: 19 mL/min — ABNORMAL LOW
Glucose, Bld: 199 mg/dL — ABNORMAL HIGH (ref 70–99)
Potassium: 3.2 mmol/L — ABNORMAL LOW (ref 3.5–5.1)
Sodium: 135 mmol/L (ref 135–145)
Total Bilirubin: 1.1 mg/dL (ref 0.0–1.2)
Total Protein: 6.1 g/dL — ABNORMAL LOW (ref 6.5–8.1)

## 2024-06-23 LAB — GLUCOSE, CAPILLARY
Glucose-Capillary: 188 mg/dL — ABNORMAL HIGH (ref 70–99)
Glucose-Capillary: 198 mg/dL — ABNORMAL HIGH (ref 70–99)
Glucose-Capillary: 180 mg/dL — ABNORMAL HIGH (ref 70–99)
Glucose-Capillary: 185 mg/dL — ABNORMAL HIGH (ref 70–99)
Glucose-Capillary: 185 mg/dL — ABNORMAL HIGH (ref 70–99)
Glucose-Capillary: 207 mg/dL — ABNORMAL HIGH (ref 70–99)

## 2024-06-23 LAB — URINALYSIS, W/ REFLEX TO CULTURE (INFECTION SUSPECTED)
Bacteria, UA: NONE SEEN
Bilirubin Urine: NEGATIVE
Glucose, UA: NEGATIVE mg/dL
Hgb urine dipstick: NEGATIVE
Ketones, ur: NEGATIVE mg/dL
Leukocytes,Ua: NEGATIVE
Nitrite: NEGATIVE
Protein, ur: 100 mg/dL — AB
Specific Gravity, Urine: 1.015 (ref 1.005–1.030)
pH: 5 (ref 5.0–8.0)

## 2024-06-23 LAB — CBC
HCT: 32.1 % — ABNORMAL LOW (ref 36.0–46.0)
Hemoglobin: 10.3 g/dL — ABNORMAL LOW (ref 12.0–15.0)
MCH: 29.4 pg (ref 26.0–34.0)
MCHC: 32.1 g/dL (ref 30.0–36.0)
MCV: 91.7 fL (ref 80.0–100.0)
Platelets: 256 K/uL (ref 150–400)
RBC: 3.5 MIL/uL — ABNORMAL LOW (ref 3.87–5.11)
RDW: 13.3 % (ref 11.5–15.5)
WBC: 15.1 K/uL — ABNORMAL HIGH (ref 4.0–10.5)
nRBC: 0 % (ref 0.0–0.2)

## 2024-06-23 LAB — PROTIME-INR
INR: 1.2 (ref 0.8–1.2)
Prothrombin Time: 16 s — ABNORMAL HIGH (ref 11.4–15.2)

## 2024-06-23 LAB — MAGNESIUM: Magnesium: 1.8 mg/dL (ref 1.7–2.4)

## 2024-06-23 MED ORDER — HYDRALAZINE HCL 50 MG PO TABS
100.0000 mg | ORAL_TABLET | Freq: Three times a day (TID) | ORAL | Status: DC
  Administered 2024-06-23 – 2024-06-26 (×7): 100 mg via ORAL
  Filled 2024-06-23 (×9): qty 2

## 2024-06-23 MED ORDER — ROSUVASTATIN CALCIUM 10 MG PO TABS
20.0000 mg | ORAL_TABLET | Freq: Every day | ORAL | Status: DC
  Administered 2024-06-23 – 2024-06-25 (×3): 20 mg via ORAL
  Filled 2024-06-23 (×3): qty 2

## 2024-06-23 MED ORDER — TRAZODONE HCL 50 MG PO TABS
50.0000 mg | ORAL_TABLET | Freq: Once | ORAL | Status: AC
  Administered 2024-06-23: 50 mg via ORAL
  Filled 2024-06-23: qty 1

## 2024-06-23 MED ORDER — POTASSIUM CHLORIDE 10 MEQ/100ML IV SOLN
10.0000 meq | INTRAVENOUS | Status: AC
  Administered 2024-06-23 (×3): 10 meq via INTRAVENOUS
  Filled 2024-06-23 (×3): qty 100

## 2024-06-23 MED ORDER — SODIUM CHLORIDE 0.9 % IV SOLN: INTRAVENOUS | Status: AC

## 2024-06-23 MED ORDER — POTASSIUM CHLORIDE 10 MEQ/100ML IV SOLN
10.0000 meq | INTRAVENOUS | Status: DC
  Filled 2024-06-23 (×3): qty 100

## 2024-06-23 NOTE — Assessment & Plan Note (Signed)
 History of CKD stage IIIb Worsening renal function and patient is unable to void stating it is causing pain. Renal ultrasound was negative for hydronephrosis, did show medical renal disease with history of underlying CKD stage IIIb. -Ordered In-N-Out catheter -Monitor renal function -Continue with gentle hydration -Avoid nephrotoxins

## 2024-06-23 NOTE — Assessment & Plan Note (Signed)
 Blood pressure mildly elevated -Continue home Coreg  and hydralazine  -Holding home torsemide , spironolactone  due to AKI

## 2024-06-23 NOTE — Assessment & Plan Note (Signed)
 Continue home Crestor

## 2024-06-23 NOTE — Assessment & Plan Note (Addendum)
 Concern of ileus/dysfunctional colon with history of ischemic colitis/Ogilvie's syndrome. GI is on board and a rectal tube was placed for decompression, repeat KUB with some improvement so GI would like to continue with rectal tube.  Patient keep asking to remove it. -Continue with rectal tube for now -Continue with Cipro  and Flagyl  -Will remain n.p.o. overnight per GI -Continue with supportive care and IV fluid -Try avoiding opiate

## 2024-06-23 NOTE — Progress Notes (Signed)
 MD requested bladder scan. Pt bladder scan shows . Educated patient on trying to urinate utilizing the purewick in place. Pt states I don't want to pee because it hurts. Pt educated she can't hold it because it will cause more problems down the road. Pt rolled her eyes and did not answer this RN. Encouraged patient to urinate.

## 2024-06-23 NOTE — Assessment & Plan Note (Signed)
 No chest pain. -Holding home aspirin  and Plavix  until ileus resolved as she might have to go to the OR.

## 2024-06-23 NOTE — Progress Notes (Signed)
 Performed in and out cath x3 tries before success. Pt is unable to bend her knees back. Pt requested this RN to leave tube in place. Pt stated I had one for 6 days and my mother had one. Just leave the tube in there overnight. Pt educated on infections control risk and that MD stated to use in and out cath only. Pt argumentative with this RN over cath. Also Pt continues to state that her rectal tube is blocking her ability to pee. Attempted to explain several times to patient how the rectal tube works. Pt states this RN doesn't know anything. Pt also states that I'm in the worst pain of my life. Pt educated again that she has an ileus and the GI MD, Dr Jinny has stated she should not use narcotics as it will not improve the situation. Educated patient that she had tylenol  per MD.  Pt again stated You don't understand. Notified Dr Caleen via epic chat of patient's complaints and concerns. Dr Caleen stated again to give tylenol  and to only use in and out cath at this time.

## 2024-06-23 NOTE — Progress Notes (Signed)
 "    Alexis Copping, MD [facility]   [address] [place], [place] [address] Phone: [phone] Fax : [phone]   Subjective: This patient has colonic ileus likely due to her history of ischemic colitis.  The patient had a rectal tube placed yesterday and a KUB was done this morning and the results are pending.  The patient states that she is not bothered by the rectal tube but she feels like it is impeding her from urinating.  This same complaint was verbalized to me yesterday about the problem peeing before she had the rectal tube in but now states it is worse.   Objective: Vital signs in last 24 hours: Vitals:   06/22/24 2001 06/22/24 2343 06/23/24 0358 06/23/24 0828  BP: (!) 130/54 (!) 136/53 (!) 141/49 93/75  Pulse: 67 70 77 64  Resp: 19 19 20    Temp: 98.4 F (36.9 C) 98.9 F (37.2 C) 99 F (37.2 C) 97.9 F (36.6 C)  TempSrc:   Oral Oral  SpO2: 100% 100% 99% 100%  Weight:      Height:       Weight change:   Intake/Output Summary (Last 24 hours) at 06/23/2024 1228 Last data filed at 06/23/2024 9380 Gross per 24 hour  Intake --  Output 200 ml  Net -200 ml     Exam: Heart:: Regular rate and rhythm or without murmur or extra heart sounds Lungs: normal and clear to auscultation and percussion Abdomen: Distended with tympany   Lab Results: @LABTEST2 @ Micro Results: No results found for this or any previous visit (from the past 240 hours). Studies/Results: CT ABDOMEN PELVIS WO CONTRAST Result Date: 06/22/2024 CLINICAL DATA:  Abdominal pain, acute, nonlocalized Abdominal pain, post-op. EXAM: CT ABDOMEN AND PELVIS WITHOUT CONTRAST TECHNIQUE: Multidetector CT imaging of the abdomen and pelvis was performed following the standard protocol without IV contrast. RADIATION DOSE REDUCTION: This exam was performed according to the departmental dose-optimization program which includes automated exposure control, adjustment of the mA and/or kV according to patient size and/or use  of iterative reconstruction technique. COMPARISON:  CT scan abdomen and pelvis from 06/05/2024. FINDINGS: Lower chest: There are patchy atelectatic changes in the visualized lung bases. No overt consolidation. No pleural effusion. The heart is normal in size. No pericardial effusion. Hepatobiliary: The liver is normal in size. Non-cirrhotic configuration. No suspicious mass. There is a single 2 mm sized focus of air within the left hepatic lobe, segment 4A. It is difficult to differentiate whether it is within the portal vein tributary or a bile duct, due to lack of intravenous contrast. No intrahepatic or extrahepatic bile duct dilation. Gallbladder is surgically absent. Pancreas: Unremarkable. No pancreatic ductal dilatation or surrounding inflammatory changes. Spleen: Within normal limits. No focal lesion. Adrenals/Urinary Tract: Adrenal glands are unremarkable. No suspicious renal mass within the limitations of this unenhanced exam. No nephroureterolithiasis or obstructive uropathy. Bilateral vascular calcifications noted. Unremarkable urinary bladder. Stomach/Bowel: There is disproportionate dilation of the colon measuring up to 10.5 cm at the level of cecum. There is unformed liquid stool throughout the colon. No definite transition point seen. No focal obstructing mass seen. 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 No abnormal bowel wall thickening or inflammatory changes. There is small sliding hiatal hernia. There is fluid-filled and dilated visualized lower thoracic esophagus, which is nonspecific but most likely seen in the settings of chronic gastroesophageal reflux disease versus esophageal dysmotility. Appendix is not distinctly visualized. Small bowel loops are nondilated. There are scattered  diverticula mainly in the left hemi colon, without imaging signs of diverticulitis. Vascular/Lymphatic: No ascites or pneumoperitoneum. No abdominal or pelvic lymphadenopathy, by size  criteria. No aneurysmal dilation of the major abdominal arteries. There are marked peripheral atherosclerotic vascular calcifications of the aorta and its major branches. Reproductive: The uterus is unremarkable. No large adnexal mass. Other: There is a tiny fat containing umbilical hernia. The soft tissues and abdominal wall are otherwise unremarkable. Musculoskeletal: No suspicious osseous lesions. There are moderate multilevel degenerative changes in the visualized spine. IMPRESSION: 1. There is disproportionate dilation of the colon measuring up to 10.5 cm at the level of cecum. There is unformed liquid stool throughout the colon. No definite transition point seen. No focal obstructing mass seen. 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 2. There is a single 2 mm sized focus of air within the left hepatic lobe, segment 4A. It is difficult to differentiate whether it is within the portal vein tributary or a bile duct, due to lack of intravenous contrast. 3. Multiple other nonacute observations, as described above. Aortic Atherosclerosis (ICD10-I70.0). Electronically Signed   By: Ree Molt M.D.   On: 06/22/2024 09:00   DG Chest Portable 1 View Result Date: 06/22/2024 EXAM: 1 VIEW(S) XRAY OF THE CHEST 06/22/2024 07:46:00 AM COMPARISON: 09/29/2023 CLINICAL HISTORY: sob FINDINGS: LUNGS AND PLEURA: Low lung volumes accentuate pulmonary vasculature. Left basilar atelectasis. No pleural effusion. No pneumothorax. HEART AND MEDIASTINUM: Aortic atherosclerosis. Low lung volumes accentuate the cardiomediastinal silhouette. BONES AND SOFT TISSUES: No acute osseous abnormality. IMPRESSION: 1. Left basilar atelectasis accentuated by low lung volumes. Electronically signed by: Dayne Hassell MD 06/22/2024 08:09 AM EST RP Workstation: [id number]   Medications: I have reviewed the patient's current medications. Scheduled Meds:  carvedilol   12.5 mg Oral BID WC   enoxaparin  (LOVENOX ) injection  30 mg  Subcutaneous Q24H   [START ON 07/11/2024] gabapentin   300 mg Oral QHS   insulin  aspart  0-5 Units Subcutaneous QHS   insulin  aspart  0-9 Units Subcutaneous TID WC   insulin  glargine  15 Units Subcutaneous Daily   Continuous Infusions:  ciprofloxacin  400 mg (06/22/24 1317)   metronidazole  500 mg (06/23/24 0615)   potassium chloride  10 mEq (06/23/24 1220)   PRN Meds:.acetaminophen  **OR** acetaminophen , fentaNYL  (SUBLIMAZE ) injection, hydrALAZINE , ondansetron  **OR** ondansetron  (ZOFRAN ) IV, oxyCODONE , polyethylene glycol, prochlorperazine    Assessment: Principal Problem:   AKI (acute kidney injury) Active Problems:   Uncontrolled type 2 diabetes mellitus with hyperglycemia, with long-term current use of insulin  (HCC)   Hyperlipidemia   CKD (chronic kidney disease) stage 4, GFR 15-29 ml/min (HCC)   Chronic diastolic CHF (congestive heart failure) (HCC)   Ileus (HCC)   Ischemic colitis   Essential hypertension   Ogilvie syndrome   Abdominal pain    Plan: This patient has a distended abdomen with colonic ileus with a dilated colon and a KUB that was done today with the results pending.  The patient has not had much come out of the rectal tube and she thinks that the rectal tube is causing her to have issues with urinating although she mentioned that she had problems urinating before the rectal tube was in when I saw her yesterday.  I would continue to avoid narcotics which will slow down the patient's gut and if the colon does not decompress with the rectal tube then the patient will need a colonic decompression with a colonoscope.   LOS: 0 days   Alexis Copping, MD.FACG 06/23/2024, 12:28 PM Pager [phone]  7am-5pm  Check AMION for 5pm -7am coverage and on weekends  "

## 2024-06-23 NOTE — Progress Notes (Signed)
 " Progress Note   Patient: Alexis Farmer FMW:[id number] DOB: 10/04/1940 DOA: 06/22/2024     0 DOS: the patient was seen and examined on 06/23/2024   Brief hospital course: Partly taken from prior notes.  ardell Ezzell is a pleasant 84 y.o. female with medical history significant for HTN, DM, CKD, dCHF, severe PAH, PAD, CVA, MGUS, morbid obesity, recent history of ischemic colitis s/p colonoscopy who came into ED complaining of abdominal pain and vomiting for 2 days.  Patient was recently admitted and discharged from the hospital between 06/05/2024 and 06/06/2024 for colitis, cystitis, AKI and right leg weakness.  She underwent colonoscopy on 12/22 which showed single linear ulceration of the [facility] flexure which may represent a small area of ischemia, rest of the colon showed diverticulosis without any signs of bleeding or formation.  AKI was improved after IV fluid.   On presentation vital stable, labs with creatinine of 1.92, leukocytosis at 11.2, blood sugar 361.  CXR with left basilar atelectasis.CT abdomen and pelvis showed disproportionate dilation of colon measuring up to 10.5 cm at the level of cecum. There is a single 2 mm size focus of air within the left hepatic lobe.   GI was consulted by EDP, they advised trial colonic decompression by placing rectal tube.  patient was also started on Cipro  and Flagyl .  1/6: Vitals mostly stable, worsening leukocytosis at 15.1, hemoglobin decreased to 10.3, potassium at 3.2, worsening creatinine now at 2.39.  Renal ultrasound with increased cortical echogenicity of right kidney consistent with CKD.  No hydronephrosis.  Repeat KUB with moderate but improved gaseous distention of colon consistent with ileus or functional colonic disturbance. GI would like to keep rectal tube, patient keep asking to remove it as it was causing discomfort.  Unable to urinate with positive bladder scan volume above 400.  Refusing catheterization.  Patient was refusing most  of the care and keeps saying that she knows better about herself.  She was keep asking to remove rectal tube and let her go.  Family would like to continue management. Ordered UA with reflex to culture if needed.  Assessment and Plan: * AKI (acute kidney injury) History of CKD stage IIIb Worsening renal function and patient is unable to void stating it is causing pain. Renal ultrasound was negative for hydronephrosis, did show medical renal disease with history of underlying CKD stage IIIb. -Ordered In-N-Out catheter -Monitor renal function -Continue with gentle hydration -Avoid nephrotoxins  Abdominal pain Concern of ileus/dysfunctional colon with history of ischemic colitis/Ogilvie's syndrome. GI is on board and a rectal tube was placed for decompression, repeat KUB with some improvement so GI would like to continue with rectal tube.  Patient keep asking to remove it. -Continue with rectal tube for now -Continue with Cipro  and Flagyl  -Will remain n.p.o. overnight per GI -Continue with supportive care and IV fluid -Try avoiding opiate  Chronic diastolic CHF (congestive heart failure) (HCC) Clinically appears euvolemic. -Keep holding diuretic -Monitor volume status closely as she is getting some IV fluid  Uncontrolled type 2 diabetes mellitus with hyperglycemia, with long-term current use of insulin  (HCC) CBC mildly elevated. - Patient refusing Lantus  as she is n.p.o. -Continue with SSI  Essential hypertension Blood pressure mildly elevated -Continue home Coreg  and hydralazine  -Holding home torsemide , spironolactone  due to AKI  Hyperlipidemia - Continue home Crestor   History of CAD (coronary artery disease) No chest pain. -Holding home aspirin  and Plavix  until ileus resolved as she might have to go to the OR.  Subjective: Patient was seen and examined today.  She continued to complain about rectal tube causing discomfort and asking to remove it.  She was refusing to  urinate stating this rectal tube is not making her pee and it hurts.  Physical Exam: Vitals:   06/23/24 0358 06/23/24 0828 06/23/24 1237 06/23/24 1617  BP: (!) 141/49 93/75 (!) 157/53 (!) 154/51  Pulse: 77 64 68 80  Resp: 20  18 20   Temp: 99 F (37.2 C) 97.9 F (36.6 C) 98.8 F (37.1 C) 98.9 F (37.2 C)  TempSrc: Oral Oral Oral Oral  SpO2: 99% 100% 99% 98%  Weight:      Height:       General.  Morbidly obese elderly lady, in no acute distress. Pulmonary.  Lungs clear bilaterally, normal respiratory effort. CV.  Regular rate and rhythm, no JVD, rub or murmur. Abdomen.  Soft, mild diffuse tenderness, nondistended, BS positive. CNS.  Alert and oriented .  No focal neurologic deficit. Extremities.  No edema, no cyanosis, pulses intact and symmetrical.  Data Reviewed: Prior data reviewed  Family Communication: Discussed with daughter at bedside  Disposition: Status is: Inpatient Remains inpatient appropriate because: Severity of illness  Planned Discharge Destination: To be determined  DVT prophylaxis.  Lovenox  Time spent: 50 minutes  This record has been created using Conservation officer, historic buildings. Errors have been sought and corrected,but may not always be located. Such creation errors do not reflect on the standard of care.   Author: Amaryllis Dare, MD 06/23/2024 4:52 PM  For on call review [email or web address].  "

## 2024-06-23 NOTE — Plan of Care (Signed)
°  Problem: Clinical Measurements: Goal: Ability to maintain clinical measurements within normal limits will improve Outcome: Progressing   Problem: Clinical Measurements: Goal: Cardiovascular complication will be avoided Outcome: Progressing   Problem: Safety: Goal: Ability to remain free from injury will improve Outcome: Progressing   Problem: Skin Integrity: Goal: Risk for impaired skin integrity will decrease Outcome: Progressing

## 2024-06-23 NOTE — Progress Notes (Signed)
 Patient asking for broth. Explained that she is currently NPO and what that means. Pt states she is still in pain. States 9/10 pain but is falling asleep at this time. Notified MD of above via epic chat. No new orders at this time.

## 2024-06-23 NOTE — Assessment & Plan Note (Signed)
 CBC mildly elevated. - Patient refusing Lantus  as she is n.p.o. -Continue with SSI

## 2024-06-23 NOTE — Progress Notes (Signed)
 Report called to [employer] on 2C. Pt transported via bed by NT Jon.

## 2024-06-23 NOTE — Progress Notes (Signed)
 Patient to ambulate to bedside commode at home. Daughter states that she does not walk currently. Daughter is requesting rehab.

## 2024-06-23 NOTE — Plan of Care (Signed)

## 2024-06-23 NOTE — Hospital Course (Addendum)
 Partly taken from prior notes.  Alexis Farmer is a pleasant 84 y.o. female with medical history significant for HTN, DM, CKD, dCHF, severe PAH, PAD, CVA, MGUS, morbid obesity, recent history of ischemic colitis s/p colonoscopy who came into ED complaining of abdominal pain and vomiting for 2 days.  Patient was recently admitted and discharged from the hospital between 06/05/2024 and 06/06/2024 for colitis, cystitis, AKI and right leg weakness.  She underwent colonoscopy on 12/22 which showed single linear ulceration of the [facility] flexure which may represent a small area of ischemia, rest of the colon showed diverticulosis without any signs of bleeding or formation.  AKI was improved after IV fluid.   On presentation vital stable, labs with creatinine of 1.92, leukocytosis at 11.2, blood sugar 361.  CXR with left basilar atelectasis.CT abdomen and pelvis showed disproportionate dilation of colon measuring up to 10.5 cm at the level of cecum. There is a single 2 mm size focus of air within the left hepatic lobe.   GI was consulted by EDP, they advised trial colonic decompression by placing rectal tube.  patient was also started on Cipro  and Flagyl .  1/6: Vitals mostly stable, worsening leukocytosis at 15.1, hemoglobin decreased to 10.3, potassium at 3.2, worsening creatinine now at 2.39.  Renal ultrasound with increased cortical echogenicity of right kidney consistent with CKD.  No hydronephrosis.  Repeat KUB with moderate but improved gaseous distention of colon consistent with ileus or functional colonic disturbance. GI would like to keep rectal tube, patient keep asking to remove it as it was causing discomfort.  Unable to urinate with positive bladder scan volume above 400.  Refusing catheterization.  Patient was refusing most of the care and keeps saying that she knows better about herself.  She was keep asking to remove rectal tube and let her go.  Family would like to continue management. Ordered  UA with reflex to culture if needed.  1/7: Vital stable, improving renal function as she started voiding.  Had a bowel movement, repeat KUB with resolution of colonic distention so rectal tube to be removed.  He was started on clear liquid diet and can be advance as tolerated. Hypophosphatemia with phosphorus at 1.8 which is being repleted. UA with protein urea only, not consistent with UTI  1/8: Vital stable, leukocytosis resolved, improving renal function with creatinine now at 1.19, tolerating diet and having bowel movement.  hypophosphatemia improved to 2.4-giving some more phosphorous. PT is recommending SNF, TOC was consulted  1/9: Patient remained hemodynamically stable.  Continue to have bowel movements.  She should avoid constipation and use bowel regimen as needed.  Tolerating diet.  Patient will resume her home medications.  Given 2 more days of Cipro  and is being discharged to SNF for rehab.  Patient need a close follow-up with her providers for further assistance.

## 2024-06-23 NOTE — Assessment & Plan Note (Signed)
 Clinically appears euvolemic. -Keep holding diuretic -Monitor volume status closely as she is getting some IV fluid

## 2024-06-24 ENCOUNTER — Inpatient Hospital Stay

## 2024-06-24 DIAGNOSIS — K5981 Ogilvie syndrome: Secondary | ICD-10-CM | POA: Diagnosis not present

## 2024-06-24 DIAGNOSIS — Z794 Long term (current) use of insulin: Secondary | ICD-10-CM | POA: Diagnosis not present

## 2024-06-24 DIAGNOSIS — I5032 Chronic diastolic (congestive) heart failure: Secondary | ICD-10-CM | POA: Diagnosis not present

## 2024-06-24 DIAGNOSIS — R1084 Generalized abdominal pain: Secondary | ICD-10-CM | POA: Diagnosis not present

## 2024-06-24 DIAGNOSIS — I10 Essential (primary) hypertension: Secondary | ICD-10-CM | POA: Diagnosis not present

## 2024-06-24 DIAGNOSIS — K559 Vascular disorder of intestine, unspecified: Secondary | ICD-10-CM | POA: Diagnosis not present

## 2024-06-24 DIAGNOSIS — E785 Hyperlipidemia, unspecified: Secondary | ICD-10-CM | POA: Diagnosis not present

## 2024-06-24 DIAGNOSIS — N179 Acute kidney failure, unspecified: Secondary | ICD-10-CM | POA: Diagnosis not present

## 2024-06-24 DIAGNOSIS — Z8679 Personal history of other diseases of the circulatory system: Secondary | ICD-10-CM | POA: Diagnosis not present

## 2024-06-24 DIAGNOSIS — K567 Ileus, unspecified: Secondary | ICD-10-CM | POA: Diagnosis not present

## 2024-06-24 DIAGNOSIS — E1165 Type 2 diabetes mellitus with hyperglycemia: Secondary | ICD-10-CM | POA: Diagnosis not present

## 2024-06-24 LAB — GLUCOSE, CAPILLARY
Glucose-Capillary: 220 mg/dL — ABNORMAL HIGH (ref 70–99)
Glucose-Capillary: 183 mg/dL — ABNORMAL HIGH (ref 70–99)
Glucose-Capillary: 148 mg/dL — ABNORMAL HIGH (ref 70–99)
Glucose-Capillary: 173 mg/dL — ABNORMAL HIGH (ref 70–99)
Glucose-Capillary: 181 mg/dL — ABNORMAL HIGH (ref 70–99)

## 2024-06-24 LAB — RENAL FUNCTION PANEL
Albumin: 3.1 g/dL — ABNORMAL LOW (ref 3.5–5.0)
Anion gap: 11 (ref 5–15)
BUN: 33 mg/dL — ABNORMAL HIGH (ref 8–23)
CO2: 23 mmol/L (ref 22–32)
Calcium: 8.9 mg/dL (ref 8.9–10.3)
Chloride: 101 mmol/L (ref 98–111)
Creatinine, Ser: 1.54 mg/dL — ABNORMAL HIGH (ref 0.44–1.00)
GFR, Estimated: 33 mL/min — ABNORMAL LOW
Glucose, Bld: 234 mg/dL — ABNORMAL HIGH (ref 70–99)
Phosphorus: 1.8 mg/dL — ABNORMAL LOW (ref 2.5–4.6)
Potassium: 3.8 mmol/L (ref 3.5–5.1)
Sodium: 135 mmol/L (ref 135–145)

## 2024-06-24 LAB — CBC
HCT: 30.3 % — ABNORMAL LOW (ref 36.0–46.0)
Hemoglobin: 10 g/dL — ABNORMAL LOW (ref 12.0–15.0)
MCH: 29.5 pg (ref 26.0–34.0)
MCHC: 33 g/dL (ref 30.0–36.0)
MCV: 89.4 fL (ref 80.0–100.0)
Platelets: 229 K/uL (ref 150–400)
RBC: 3.39 MIL/uL — ABNORMAL LOW (ref 3.87–5.11)
RDW: 13.3 % (ref 11.5–15.5)
WBC: 11.9 K/uL — ABNORMAL HIGH (ref 4.0–10.5)
nRBC: 0 % (ref 0.0–0.2)

## 2024-06-24 MED ORDER — SODIUM PHOSPHATES 45 MMOLE/15ML IV SOLN
30.0000 mmol | Freq: Once | INTRAVENOUS | Status: AC
  Administered 2024-06-24: 30 mmol via INTRAVENOUS
  Filled 2024-06-24: qty 10

## 2024-06-24 MED ORDER — ENOXAPARIN SODIUM 60 MG/0.6ML IJ SOSY
50.0000 mg | PREFILLED_SYRINGE | INTRAMUSCULAR | Status: DC
  Administered 2024-06-24 – 2024-06-25 (×2): 50 mg via SUBCUTANEOUS
  Filled 2024-06-24 (×2): qty 0.6

## 2024-06-24 MED ORDER — BOOST / RESOURCE BREEZE PO LIQD CUSTOM
1.0000 | Freq: Three times a day (TID) | ORAL | Status: DC
  Administered 2024-06-25 (×3): 1 via ORAL

## 2024-06-24 NOTE — Assessment & Plan Note (Addendum)
 Concern of ileus/dysfunctional colon with history of ischemic colitis/Ogilvie's syndrome. GI is on board and a rectal tube was placed for decompression, KUB on 1/7 with resolution of colonic dilatation.  Rectal tube was removed. Patient is tolerating diet, had a bowel movement -Continue with Cipro  and Flagyl  -Continue with supportive care and IV fluid -Try avoiding opiate

## 2024-06-24 NOTE — Assessment & Plan Note (Addendum)
 History of CKD stage IIIb Worsening renal function and patient is unable to void stating it is causing pain. Renal ultrasound was negative for hydronephrosis, did show medical renal disease with history of underlying CKD stage IIIb. Renal function continue to improve with creatinine of 1.19 today which is better than her baseline -Monitor renal function -Encourage p.o. hydration -Avoid nephrotoxins

## 2024-06-24 NOTE — Progress Notes (Signed)
 PHARMACIST - PHYSICIAN COMMUNICATION  CONCERNING:  Enoxaparin  (Lovenox ) for DVT Prophylaxis   ASSESSMENT: Patient was prescribed enoxaparin  40 mg subcutaneously every 24 hours for VTE prophylaxis.   Body mass index is 40.07 kg/m.  Estimated Creatinine Clearance: 30.6 mL/min (A) (by C-G formula based on SCr of 1.54 mg/dL (H)).  Based on [facility] policy, patient qualifies for enoxaparin  dosing of 0.5 mg per kilogram of total body weight every 24 hours because their body mass index is >30 kg/m2.  PLAN: Pharmacy has adjusted enoxaparin  dose per [facility] policy.  Description: Patient is now receiving enoxaparin  50 mg subcutaneously every 24 hours.  Will M. Lenon, PharmD, BCPS Clinical Pharmacist 06/24/2024 3:21 PM

## 2024-06-24 NOTE — Assessment & Plan Note (Addendum)
 Clinically appears euvolemic. -Keep holding diuretic -Monitor volume status

## 2024-06-24 NOTE — Progress Notes (Signed)
 " Progress Note   Patient: Alexis Farmer FMW:[id number] DOB: 1941/02/25 DOA: 06/22/2024     1 DOS: the patient was seen and examined on 06/24/2024   Brief hospital course: Partly taken from prior notes.  Alexis Farmer is a pleasant 84 y.o. female with medical history significant for HTN, DM, CKD, dCHF, severe PAH, PAD, CVA, MGUS, morbid obesity, recent history of ischemic colitis s/p colonoscopy who came into ED complaining of abdominal pain and vomiting for 2 days.  Patient was recently admitted and discharged from the hospital between 06/05/2024 and 06/06/2024 for colitis, cystitis, AKI and right leg weakness.  She underwent colonoscopy on 12/22 which showed single linear ulceration of the [facility] flexure which may represent a small area of ischemia, rest of the colon showed diverticulosis without any signs of bleeding or formation.  AKI was improved after IV fluid.   On presentation vital stable, labs with creatinine of 1.92, leukocytosis at 11.2, blood sugar 361.  CXR with left basilar atelectasis.CT abdomen and pelvis showed disproportionate dilation of colon measuring up to 10.5 cm at the level of cecum. There is a single 2 mm size focus of air within the left hepatic lobe.   GI was consulted by EDP, they advised trial colonic decompression by placing rectal tube.  patient was also started on Cipro  and Flagyl .  1/6: Vitals mostly stable, worsening leukocytosis at 15.1, hemoglobin decreased to 10.3, potassium at 3.2, worsening creatinine now at 2.39.  Renal ultrasound with increased cortical echogenicity of right kidney consistent with CKD.  No hydronephrosis.  Repeat KUB with moderate but improved gaseous distention of colon consistent with ileus or functional colonic disturbance. GI would like to keep rectal tube, patient keep asking to remove it as it was causing discomfort.  Unable to urinate with positive bladder scan volume above 400.  Refusing catheterization.  Patient was refusing most  of the care and keeps saying that she knows better about herself.  She was keep asking to remove rectal tube and let her go.  Family would like to continue management. Ordered UA with reflex to culture if needed.  1/7: Vital stable, improving renal function as she started voiding.  Had a bowel movement, repeat KUB with resolution of colonic distention so rectal tube to be removed.  He was started on clear liquid diet and can be advance as tolerated. Hypophosphatemia with phosphorus at 1.8 which is being repleted. UA with protein urea only, not consistent with UTI  Assessment and Plan: * AKI (acute kidney injury) History of CKD stage IIIb Worsening renal function and patient is unable to void stating it is causing pain. Renal ultrasound was negative for hydronephrosis, did show medical renal disease with history of underlying CKD stage IIIb. Renal function started improving, patient is now able to void -Monitor renal function -Continue with gentle hydration -Avoid nephrotoxins  Abdominal pain Concern of ileus/dysfunctional colon with history of ischemic colitis/Ogilvie's syndrome. GI is on board and a rectal tube was placed for decompression, KUB today with resolution of colonic dilatation. - Patient was started on clear liquid diet -Remove rectal tube and monitor -Continue with Cipro  and Flagyl  -Continue with supportive care and IV fluid -Try avoiding opiate  Chronic diastolic CHF (congestive heart failure) (HCC) Clinically appears euvolemic. -Keep holding diuretic -Monitor volume status closely as she is getting some IV fluid  Uncontrolled type 2 diabetes mellitus with hyperglycemia, with long-term current use of insulin  (HCC) CBC mildly elevated. - Patient refusing Lantus  as she is n.p.o. -Continue with  SSI  Essential hypertension Blood pressure mildly elevated -Continue home Coreg  and hydralazine  -Holding home torsemide , spironolactone  due to AKI  Hyperlipidemia -  Continue home Crestor   History of CAD (coronary artery disease) No chest pain. -Holding home aspirin  and Plavix  until ileus resolved as she might have to go to the OR.   Subjective: Patient was seen and examined today.  Had a bowel movement, still having some abdominal pain but much improved than before.  Passing flatus.  She was happy to start clear liquid diet and rectal tube to be removed.  Physical Exam: Vitals:   06/23/24 1927 06/24/24 0449 06/24/24 0721 06/24/24 1545  BP: 109/69 108/65 112/78 118/73  Pulse: 77 81 82 75  Resp: 16 16 17 20   Temp: 99.3 F (37.4 C) 99.2 F (37.3 C) 99.4 F (37.4 C) 99 F (37.2 C)  TempSrc:      SpO2: 99% 95% 96% 97%  Weight:      Height:       General.  Frail and obese elderly lady, in no acute distress. Pulmonary.  Lungs clear bilaterally, normal respiratory effort. CV.  Regular rate and rhythm, no JVD, rub or murmur. Abdomen.  Soft, nontender, nondistended, BS positive. CNS.  Alert and oriented .  No focal neurologic deficit. Extremities.  No edema,  pulses intact and symmetrical. Psychiatry.  Judgment and insight appears normal.   Data Reviewed: Prior data reviewed  Family Communication: Discussed with patient  Disposition: Status is: Inpatient Remains inpatient appropriate because: Severity of illness  Planned Discharge Destination: To be determined  DVT prophylaxis.  Lovenox  Time spent: 50 minutes  This record has been created using Conservation officer, historic buildings. Errors have been sought and corrected,but may not always be located. Such creation errors do not reflect on the standard of care.   Author: Amaryllis Dare, MD 06/24/2024 6:10 PM  For on call review [email or web address].  "

## 2024-06-24 NOTE — Evaluation (Signed)
 Occupational Therapy Evaluation Patient Details Name: Alexis Farmer MRN: [id number] DOB: December 04, 1940 Today's Date: 06/24/2024   History of Present Illness   84 y.o. female presents with c/o abdominal pain and vomiting for 2 days. Admitted for management of AKI, abdominal pain and concern for ileus and dysfunctional colon. PMH of HTN, DM, CKD, dCHF, severe PAH, PAD, CVA, MGUS, morbid obesity, recent history of ischemic colitis s/p colonoscopy recently admitted and discharged from the hospital between mid/late DC 2025 for colitis, cystitis, AKI and right leg weakness.     Clinical Impressions Pt was seen for OT evaluation this date. PTA, pt reports she lives with her daughter and granddaughter. Reports her daughter is in bad health and unable to assist and granddaughter is reporting now she is unable to continue caring for pt. Pt was MOD I with RW vs pushing W/C through the home ~2 months ago and MOD I/set up with ADLs. Reports sine last admission her granddaughter had been assisting with sponge bathing.  Pt presents with deficits in strength, balance and activity tolerance limiting their ability to perform ADL management at baseline level. Pt currently requires increased motivation to engage in evaluation. She declined sitting EOB, but demo long sitting in bed using bed rails and assist from therapist with Mod A for trunkal elevation. Reports pain to buttocks region where rectal tube is located. BUE ROM intact with strength grossly 3+/5. Pt reports minimal rest since being in the hospital. Pt would benefit from skilled OT services to address noted impairments and functional limitations to maximize safety and independence while minimizing future risk of falls, injury, and readmission. Do anticipate the need for follow up OT services upon acute hospital DC.      If plan is discharge home, recommend the following:   A lot of help with bathing/dressing/bathroom;Assistance with  cooking/housework;Assist for transportation;Help with stairs or ramp for entrance;A lot of help with walking and/or transfers     Functional Status Assessment   Patient has had a recent decline in their functional status and demonstrates the ability to make significant improvements in function in a reasonable and predictable amount of time.     Equipment Recommendations   Other (comment) (defer to next venue)     Recommendations for Other Services         Precautions/Restrictions   Precautions Precautions: Fall Recall of Precautions/Restrictions: Impaired Restrictions Weight Bearing Restrictions Per Provider Order: No     Mobility Bed Mobility Overal bed mobility: Needs Assistance             General bed mobility comments: came to long sitting in bed with Mod A for trunkal elevation with use of bed rail and therapist for ~30 seconds    Transfers                   General transfer comment: pt declined      Balance Overall balance assessment: Needs assistance                                         ADL either performed or assessed with clinical judgement   ADL Overall ADL's : Needs assistance/impaired                                       General ADL Comments: pt  declined any ADLs stating she already bathed today, only willing to long sit in bed requiring Mod A for trunkal elevation and unable to maintain more than 30 seconds; anticipate Max A for LB ADL management and supervision for UB     Vision         Perception         Praxis         Pertinent Vitals/Pain Pain Assessment Pain Assessment: Faces Faces Pain Scale: Hurts little more Pain Location: buttocks where rectal tube is Pain Descriptors / Indicators: Discomfort Pain Intervention(s): Monitored during session, Limited activity within patient's tolerance     Extremity/Trunk Assessment Upper Extremity Assessment Upper Extremity Assessment:  Generalized weakness (ROM intact, strength grossly 3+/5)   Lower Extremity Assessment Lower Extremity Assessment: Generalized weakness       Communication Communication Communication: Impaired Factors Affecting Communication: Hearing impaired (wears hearing aides)   Cognition Arousal: Alert Behavior During Therapy: WFL for tasks assessed/performed               OT - Cognition Comments: perseverates on pain and not being able to rest and very self limiting                 Following commands: Intact       Cueing  General Comments   Cueing Techniques: Verbal cues;Tactile cues  rectal tube, purewick and IV intact pre/post session as pt is very self limiting and only agreeable to long sit in bed   Exercises Other Exercises Other Exercises: Edu on role of OT in acute setting and DC recommendations.   Shoulder Instructions      Home Living Family/patient expects to be discharged to:: Private residence Living Arrangements: Other relatives Available Help at Discharge: Family (lives with dtr and granddaughter) Type of Home: House Home Access: Level entry     Home Layout: One level     Bathroom Shower/Tub: Sponge bathes at baseline;Tub/shower unit   Bathroom Toilet: Handicapped height     Home Equipment: Agricultural Consultant (2 wheels);Rollator (4 wheels);BSC/3in1;Wheelchair - manual;Lift chair   Additional Comments: reported last admission she sleeps in lift recliner and uses it to stand      Prior Functioning/Environment Prior Level of Function : Needs assist       Physical Assist : Mobility (physical);ADLs (physical) Mobility (physical): Gait ADLs (physical): Bathing;Dressing;IADLs Mobility Comments: prior to last hospitalization reports over the last 2 months she had been having increased difficulty was pushing her W/C into the bathroom ADLs Comments: granddaughter has been assisting with sponge bathing and other ADLs as able, pt was MOD I/SUPv prior to  ~1-2 months ago    OT Problem List: Decreased activity tolerance;Impaired balance (sitting and/or standing);Decreased safety awareness;Pain;Decreased strength   OT Treatment/Interventions: Self-care/ADL training;Balance training;Therapeutic exercise;Therapeutic activities;Energy conservation;Patient/family education      OT Goals(Current goals can be found in the care plan section)   Acute Rehab OT Goals Patient Stated Goal: get better OT Goal Formulation: With patient Time For Goal Achievement: 07/08/24 Potential to Achieve Goals: Fair ADL Goals Pt Will Perform Grooming: with modified independence;with set-up;sitting Pt Will Perform Lower Body Dressing: with supervision;sitting/lateral leans;sit to/from stand Pt Will Transfer to Toilet: with supervision;ambulating;stand pivot transfer   OT Frequency:  Min 2X/week    Co-evaluation              AM-PAC OT 6 Clicks Daily Activity     Outcome Measure Help from another person eating meals?: A Little Help from another person  taking care of personal grooming?: A Little Help from another person toileting, which includes using toliet, bedpan, or urinal?: A Lot Help from another person bathing (including washing, rinsing, drying)?: A Lot Help from another person to put on and taking off regular upper body clothing?: A Little Help from another person to put on and taking off regular lower body clothing?: A Lot 6 Click Score: 15   End of Session Nurse Communication: Mobility status  Activity Tolerance: Patient limited by pain;Patient tolerated treatment well Patient left: in bed;with call bell/phone within reach;with bed alarm set  OT Visit Diagnosis: Unsteadiness on feet (R26.81);Repeated falls (R29.6);Muscle weakness (generalized) (M62.81)                Time: 8371-8353 OT Time Calculation (min): 18 min Charges:  OT General Charges $OT Visit: 1 Visit OT Evaluation $OT Eval Low Complexity: 1 Low Jackline Castilla Chrismon,  OTR/L  06/24/2024, 5:04 PM  Lydell Moga E Chrismon 06/24/2024, 5:01 PM

## 2024-06-25 ENCOUNTER — Telehealth: Payer: Self-pay

## 2024-06-25 DIAGNOSIS — N179 Acute kidney failure, unspecified: Secondary | ICD-10-CM | POA: Diagnosis not present

## 2024-06-25 DIAGNOSIS — E785 Hyperlipidemia, unspecified: Secondary | ICD-10-CM | POA: Diagnosis not present

## 2024-06-25 DIAGNOSIS — I10 Essential (primary) hypertension: Secondary | ICD-10-CM | POA: Diagnosis not present

## 2024-06-25 DIAGNOSIS — E1165 Type 2 diabetes mellitus with hyperglycemia: Secondary | ICD-10-CM | POA: Diagnosis not present

## 2024-06-25 DIAGNOSIS — I5032 Chronic diastolic (congestive) heart failure: Secondary | ICD-10-CM | POA: Diagnosis not present

## 2024-06-25 DIAGNOSIS — Z8679 Personal history of other diseases of the circulatory system: Secondary | ICD-10-CM | POA: Diagnosis not present

## 2024-06-25 DIAGNOSIS — R1084 Generalized abdominal pain: Secondary | ICD-10-CM | POA: Diagnosis not present

## 2024-06-25 DIAGNOSIS — K567 Ileus, unspecified: Secondary | ICD-10-CM | POA: Diagnosis not present

## 2024-06-25 DIAGNOSIS — K5981 Ogilvie syndrome: Secondary | ICD-10-CM | POA: Diagnosis not present

## 2024-06-25 DIAGNOSIS — K559 Vascular disorder of intestine, unspecified: Secondary | ICD-10-CM | POA: Diagnosis not present

## 2024-06-25 DIAGNOSIS — Z794 Long term (current) use of insulin: Secondary | ICD-10-CM | POA: Diagnosis not present

## 2024-06-25 LAB — CBC
HCT: 31.1 % — ABNORMAL LOW (ref 36.0–46.0)
Hemoglobin: 10.1 g/dL — ABNORMAL LOW (ref 12.0–15.0)
MCH: 29.1 pg (ref 26.0–34.0)
MCHC: 32.5 g/dL (ref 30.0–36.0)
MCV: 89.6 fL (ref 80.0–100.0)
Platelets: 238 K/uL (ref 150–400)
RBC: 3.47 MIL/uL — ABNORMAL LOW (ref 3.87–5.11)
RDW: 13.6 % (ref 11.5–15.5)
WBC: 10.2 K/uL (ref 4.0–10.5)
nRBC: 0 % (ref 0.0–0.2)

## 2024-06-25 LAB — RENAL FUNCTION PANEL
Albumin: 3 g/dL — ABNORMAL LOW (ref 3.5–5.0)
Anion gap: 8 (ref 5–15)
BUN: 25 mg/dL — ABNORMAL HIGH (ref 8–23)
CO2: 27 mmol/L (ref 22–32)
Calcium: 8.8 mg/dL — ABNORMAL LOW (ref 8.9–10.3)
Chloride: 104 mmol/L (ref 98–111)
Creatinine, Ser: 1.19 mg/dL — ABNORMAL HIGH (ref 0.44–1.00)
GFR, Estimated: 45 mL/min — ABNORMAL LOW
Glucose, Bld: 164 mg/dL — ABNORMAL HIGH (ref 70–99)
Phosphorus: 2.4 mg/dL — ABNORMAL LOW (ref 2.5–4.6)
Potassium: 3.5 mmol/L (ref 3.5–5.1)
Sodium: 138 mmol/L (ref 135–145)

## 2024-06-25 LAB — GLUCOSE, CAPILLARY
Glucose-Capillary: 162 mg/dL — ABNORMAL HIGH (ref 70–99)
Glucose-Capillary: 214 mg/dL — ABNORMAL HIGH (ref 70–99)
Glucose-Capillary: 183 mg/dL — ABNORMAL HIGH (ref 70–99)
Glucose-Capillary: 161 mg/dL — ABNORMAL HIGH (ref 70–99)

## 2024-06-25 MED ORDER — K PHOS MONO-SOD PHOS DI & MONO 155-852-130 MG PO TABS
500.0000 mg | ORAL_TABLET | Freq: Once | ORAL | Status: AC
  Administered 2024-06-25: 500 mg via ORAL
  Filled 2024-06-25: qty 2

## 2024-06-25 MED ORDER — CIPROFLOXACIN HCL 500 MG PO TABS
500.0000 mg | ORAL_TABLET | Freq: Two times a day (BID) | ORAL | Status: DC
  Administered 2024-06-25 – 2024-06-26 (×2): 500 mg via ORAL
  Filled 2024-06-25 (×2): qty 1

## 2024-06-25 MED ORDER — TRAZODONE HCL 50 MG PO TABS
50.0000 mg | ORAL_TABLET | Freq: Once | ORAL | Status: AC
  Administered 2024-06-25: 50 mg via ORAL
  Filled 2024-06-25: qty 1

## 2024-06-25 MED ORDER — INSULIN ASPART 100 UNIT/ML IJ SOLN
3.0000 [IU] | Freq: Three times a day (TID) | INTRAMUSCULAR | Status: DC
  Administered 2024-06-25 – 2024-06-26 (×2): 3 [IU] via SUBCUTANEOUS
  Filled 2024-06-25 (×2): qty 3

## 2024-06-25 MED ORDER — CIPROFLOXACIN IN D5W 400 MG/200ML IV SOLN: 400.0000 mg | Freq: Two times a day (BID) | INTRAVENOUS | Status: DC

## 2024-06-25 NOTE — Plan of Care (Signed)

## 2024-06-25 NOTE — Progress Notes (Signed)
 PHARMACY NOTE:  ANTIMICROBIAL RENAL DOSAGE ADJUSTMENT  Current antimicrobial regimen includes a mismatch between antimicrobial dosage and estimated renal function.  As per policy approved by the Pharmacy & Therapeutics and Medical Executive Committees, the antimicrobial dosage will be adjusted accordingly.  Current antimicrobial dosage:  Ciprofloxacin  400mg  IV q24H  Indication: Colitis  Renal Function:  Estimated Creatinine Clearance: 39.6 mL/min (A) (by C-G formula based on SCr of 1.19 mg/dL (H)).    Antimicrobial dosage has been changed to:  Ciprofloxacin  400mg  IV q12H  Additional comments: Converting to PO later today   Thank you for allowing pharmacy to be a part of this patient's care.  Elsie CHRISTELLA Piety, [facility] 06/25/2024 1:32 PM

## 2024-06-25 NOTE — H&P (Signed)
 "  Ruel Kung , MD [address], [address], [place], [place], [address] Phone: [phone] Fax: [phone]   [name] is being followed for colon distension    Subjective: No complaints, spoke to nurse taking care had large semi solid bowel movement and when I went in to see her patient stated she was having another. No pain    Objective: Vital signs in last 24 hours: Vitals:   06/24/24 1958 06/24/24 2148 06/25/24 0500 06/25/24 0720  BP: 122/74 122/74 (!) 150/59 109/66  Pulse: 74  69 64  Resp: 18  16 16   Temp: 98.7 F (37.1 C)  98.9 F (37.2 C) 98.6 F (37 C)  TempSrc:   Oral   SpO2: 98%  98% 99%  Weight:      Height:       Weight change:   Intake/Output Summary (Last 24 hours) at 06/25/2024 1006 Last data filed at 06/25/2024 0606 Gross per 24 hour  Intake 1287.83 ml  Output 900 ml  Net 387.83 ml     Exam:  Abdomen: soft, nontender, normal bowel sounds   Lab Results: @LABTEST2 @ Micro Results: No results found for this or any previous visit (from the past 240 hours). Studies/Results: DG Abd 1 View Result Date: 06/24/2024 CLINICAL DATA:  Abdominal pain. EXAM: ABDOMEN - 1 VIEW COMPARISON:  06/23/2024 FINDINGS: Normal bowel-gas pattern with resolution of the previous gaseous distention of the colon. Lumbar and lower thoracic spine degenerative changes. Mildly improved left lower lobe atelectasis. IMPRESSION: 1. Resolution of the previous gaseous distention of the colon. 2. Mildly improved left lower lobe atelectasis. Electronically Signed   By: Elspeth Bathe M.D.   On: 06/24/2024 15:02   US  RENAL Result Date: 06/23/2024 CLINICAL DATA:  Acute kidney injury. EXAM: RENAL / URINARY TRACT ULTRASOUND COMPLETE COMPARISON:  CT abdomen 06/05/2024 FINDINGS: Right Kidney: Renal measurements: 11.0 x 5.6 x 5.3 cm = volume: 170 mL. Increased cortical echogenicity. Small amount of perinephric fluid. No hydronephrosis or focal lesion. Left Kidney: Renal measurements: 11.6 x  5.9 x 4.6 cm = volume: 164 mL. Poorly visualized. No hydronephrosis or focal lesion. Bladder: Appears normal for degree of bladder distention. Other: None. IMPRESSION: Increased cortical echogenicity of the right kidney consistent with chronic kidney disease. No hydronephrosis. Small amount of right perinephric fluid. Electronically Signed   By: Marcey Moan M.D.   On: 06/23/2024 16:16   Medications: I have reviewed the patient's current medications. Scheduled Meds:  carvedilol   12.5 mg Oral BID WC   enoxaparin  (LOVENOX ) injection  50 mg Subcutaneous Q24H   feeding supplement  1 Container Oral TID BM   [START ON 07/11/2024] gabapentin   300 mg Oral QHS   hydrALAZINE   100 mg Oral TID   insulin  aspart  0-5 Units Subcutaneous QHS   insulin  aspart  0-9 Units Subcutaneous TID WC   insulin  glargine  15 Units Subcutaneous Daily   rosuvastatin   20 mg Oral Daily   Continuous Infusions:  ciprofloxacin  Stopped (06/24/24 1424)   PRN Meds:.acetaminophen  **OR** acetaminophen , fentaNYL  (SUBLIMAZE ) injection, hydrALAZINE , ondansetron  **OR** ondansetron  (ZOFRAN ) IV, oxyCODONE , polyethylene glycol, prochlorperazine    Assessment: Principal Problem:   AKI (acute kidney injury) Active Problems:   Uncontrolled type 2 diabetes mellitus with hyperglycemia, with long-term current use of insulin  (HCC)   Hyperlipidemia   Chronic diastolic CHF (congestive heart failure) (HCC)   Ileus (HCC)   Ischemic colitis   Essential hypertension   Ogilvie syndrome   Abdominal pain   History of CAD (  coronary artery disease)   Alexis Farmer 84 y.o. female with history of ischemic colitis , colon distension , X ray on 06/25/2023 shows resolution of the gaseous distension . Cr improving . Potassium still on low end . Bowels are moving.    Plan: Correct electrolytes keep K > 4.0 , MG>2.0 . Limit narcotics, ensure hydration and mobilization .   I will sign off.  Please call me if any further GI concerns or questions.   We would like to thank you for the opportunity to participate in the care of Alexis Farmer.    LOS: 2 days   Ruel Kung, MD 06/25/2024, 10:06 AM  "

## 2024-06-25 NOTE — [place] FL2 (Signed)
 " [place]  MEDICAID FL2 LEVEL OF CARE FORM     IDENTIFICATION  Patient Name: Alexis Farmer Birthdate: May 23, 1941 Sex: female Admission Date (Current Location): 06/22/2024  [facility] and [place] Number:  Chiropodist and Address:  [facility], [address] [place], [place] [address]      Provider Number: [id number]  Attending Physician Name and Address:  Caleen Qualia, MD  Relative Name and Phone Number:       Current Level of Care: Hospital Recommended Level of Care: Skilled Nursing Facility Prior Approval Number:    Date Approved/Denied:   PASRR Number: [id number]  Discharge Plan: SNF    Current Diagnoses: Patient Active Problem List   Diagnosis Date Noted   History of CAD (coronary artery disease) 06/23/2024   AKI (acute kidney injury) 06/22/2024   Essential hypertension 06/22/2024   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   Abdominal pain 06/22/2024   Adjustment disorder with depressed mood 06/10/2024   Polyp of colon 06/08/2024   Ischemic colitis 06/05/2024   Right hip pain 05/11/2024   Pain due to onychomycosis of toenails of both feet 05/11/2024   Right leg pain 03/24/2024   Achilles tendinitis 03/18/2024   Moderate episode of recurrent major depressive disorder (HCC) 02/06/2024   Chronic rhinitis 02/06/2024   Insomnia 02/06/2024   History of CVA (cerebrovascular accident) 10/23/2023   Left ankle pain 10/23/2023   Left-sided weakness 10/19/2023   Severe pulmonary hypertension (HCC) 10/01/2023   Diffuse pain 08/03/2023   Cellulitis of left lower extremity 04/02/2023   Hyponatremia 04/01/2023   Cellulitis 04/01/2023   Lower extremity weakness 04/01/2023   Inability to access health care due to transportation insecurity 03/31/2023   Ceruminosis, right 09/07/2022   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/2022   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   Chronic renal failure (CRF), stage 3b (HCC) 03/21/2022    Ileus (HCC) 08/22/2021   Lymphedema 04/19/2021   Atherosclerosis of aorta 01/16/2021   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/16/2021   Spinal stenosis of lumbar region 01/16/2021   Chronic pain syndrome 08/25/2020   Hypertension associated with diabetes (HCC) 08/25/2020   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) 05/30/2020   Retinopathy 05/30/2020   Multinodular goiter 02/04/2020   Proteinuria 12/22/2019   Constipation 11/17/2019   Physical deconditioning 08/13/2019   Abdominal aortic atherosclerosis 07/24/2019   Obesity, Class III, BMI 40-49.9 (morbid obesity) (HCC) 06/09/2019   Acute renal failure superimposed on stage 3b chronic kidney disease (HCC) 02/06/2019   Round hole, unspecified eye 02/06/2019   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   Disorder of rotator cuff 06/24/2018   Abnormal gait 03/26/2018   Arthritis 03/26/2018   Thyroid  nodule 01/20/2018   Carotid artery stenosis 07/19/2016   PAD (peripheral artery disease) 07/19/2016   Chronic diastolic CHF (congestive heart failure) (HCC) 05/31/2016   Osteoarthritis 10/13/2015   Anxiety 09/13/2015   CKD (chronic kidney disease) stage 4, GFR 15-29 ml/min (HCC) 09/13/2015   Hyperlipidemia 08/26/2014   Uncontrolled type 2 diabetes mellitus with hyperglycemia, with long-term current use of insulin  (HCC) 04/08/2014   Diabetic polyneuropathy (HCC) 04/08/2014   Type 2 diabetes mellitus with other specified complication (HCC) 04/08/2014   Diabetic nephropathy associated with type 2 diabetes mellitus (HCC) 04/08/2014    Orientation RESPIRATION BLADDER Height & Weight     Self, Time, Situation, Place  Normal Incontinent, External catheter Weight: 226 lb 3.1 oz (102.6 kg) Height:  5' 3 (160 cm)  BEHAVIORAL SYMPTOMS/MOOD NEUROLOGICAL BOWEL NUTRITION STATUS   (None)  (None) Incontinent Diet (Full liquid. Advancing as tolerated.)   AMBULATORY STATUS COMMUNICATION OF NEEDS Skin   Extensive Assist Verbally Normal                       Personal Care Assistance Level of Assistance  Bathing, Feeding, Dressing Bathing Assistance: Maximum assistance Feeding assistance: Limited assistance Dressing Assistance: Maximum assistance     Functional Limitations Info  Sight, Hearing, Speech Sight Info: Adequate Hearing Info: Adequate Speech Info: Adequate    SPECIAL CARE FACTORS FREQUENCY  PT (By licensed PT), OT (By licensed OT)     PT Frequency: 5 x week OT Frequency: 5 x week            Contractures Contractures Info: Not present    Additional Factors Info  Code Status, Allergies Code Status Info: Full code Allergies Info: Celexa  (Citalopram ), Jardiance  (Empagliflozin ), Norvasc  (Amlodipine ), Tape, Penicillin V, Penicillin V Potassium, Penicillins           Current Medications (06/25/2024):  This is the current hospital active medication list Current Facility-Administered Medications  Medication Dose Route Frequency Provider Last Rate Last Admin   acetaminophen  (TYLENOL ) tablet 650 mg  650 mg Oral Q6H PRN Paudel, Keshab, MD   650 mg at 06/25/24 0844   Or   acetaminophen  (TYLENOL ) suppository 650 mg  650 mg Rectal Q6H PRN Paudel, Keshab, MD       carvedilol  (COREG ) tablet 12.5 mg  12.5 mg Oral BID WC Paudel, Keshab, MD   12.5 mg at 06/25/24 0844   ciprofloxacin  (CIPRO ) IVPB 400 mg  400 mg Intravenous Q24H Paudel, Keshab, MD   Stopped at 06/24/24 1424   enoxaparin  (LOVENOX ) injection 50 mg  50 mg Subcutaneous Q24H Lenon Elsie HERO, RPH   50 mg at 06/24/24 2149   feeding supplement (BOOST / RESOURCE BREEZE) liquid 1 Container  1 Container Oral TID BM Amin, Sumayya, MD   1 Container at 06/25/24 0843   fentaNYL  (SUBLIMAZE ) injection 25 mcg  25 mcg Intravenous Q2H PRN Paudel, Keshab, MD   25 mcg at 06/23/24 2029   [START ON 07/11/2024] gabapentin  (NEURONTIN ) capsule 300 mg  300 mg Oral QHS Paudel, Nena,  MD       hydrALAZINE  (APRESOLINE ) injection 10 mg  10 mg Intravenous Q6H PRN Paudel, Nena, MD       hydrALAZINE  (APRESOLINE ) tablet 100 mg  100 mg Oral TID Amin, Sumayya, MD   100 mg at 06/24/24 2148   insulin  aspart (novoLOG ) injection 0-5 Units  0-5 Units Subcutaneous QHS Paudel, Nena, MD       insulin  aspart (novoLOG ) injection 0-9 Units  0-9 Units Subcutaneous TID WC Paudel, Keshab, MD   2 Units at 06/25/24 0847   insulin  glargine (LANTUS ) injection 15 Units  15 Units Subcutaneous Daily Paudel, Keshab, MD   15 Units at 06/25/24 1011   ondansetron  (ZOFRAN ) tablet 4 mg  4 mg Oral Q6H PRN Paudel, Keshab, MD       Or   ondansetron  (ZOFRAN ) injection 4 mg  4 mg Intravenous Q6H PRN Paudel, Nena, MD       oxyCODONE  (Oxy IR/ROXICODONE ) immediate release tablet 5 mg  5 mg Oral Q4H PRN Paudel, Keshab, MD   5 mg at 06/24/24 2204   polyethylene glycol (MIRALAX  / GLYCOLAX ) packet 17 g  17 g Oral Daily PRN Paudel, Keshab, MD   17 g at  06/25/24 1018   prochlorperazine  (COMPAZINE ) injection 10 mg  10 mg Intravenous Q4H PRN Roann Gouty, MD       rosuvastatin  (CRESTOR ) tablet 20 mg  20 mg Oral Daily Amin, Sumayya, MD   20 mg at 06/24/24 2147     Discharge Medications: Please see discharge summary for a list of discharge medications.  Relevant Imaging Results:  Relevant Lab Results:   Additional Information SS#: [id number]  Lauraine JAYSON Carpen, LCSW     "

## 2024-06-25 NOTE — TOC Initial Note (Signed)
 Transition of Care [facility]) - Initial/Assessment Note    Patient Details  Name: Alexis Farmer MRN: [id number] Date of Birth: 1940-10-04  Transition of Care [facility]) CM/SW Contact:    Lauraine JAYSON Carpen, LCSW Phone Number: 06/25/2024, 11:10 AM  Clinical Narrative:   Readmission prevention screen complete. CSW met with patient. Family member at bedside. CSW introduced role and explained that discharge planning would be discussed. PCP is Luke Shade, MD. She uses [facility] to get to appointments. [facility] [place] She reports sometimes having difficulty affording her diabetes supplies. Patient lives at home with her granddaughter. Patient stated she is not receiving home therapy but [facility] confirmed they are still active with her for PT and OT. OT is recommending SNF. Patient is agreeable if PT recommends it as well. Patient has a RW, wheelchair, BSC, and shower chair at home. No further concerns. CSW will continue to follow patient for support and facilitate discharge once medically stable. Patient will need ambulance transport at discharge.             Expected Discharge Plan: Skilled Nursing Facility Barriers to Discharge: Continued Medical Work up   Patient Goals and CMS Choice            Expected Discharge Plan and Services     Post Acute Care Choice: Skilled Nursing Facility Living arrangements for the past 2 months: Single Family Home                                      Prior Living Arrangements/Services Living arrangements for the past 2 months: Single Family Home Lives with:: Relatives Patient language and need for interpreter reviewed:: Yes Do you feel safe going back to the place where you live?: Yes      Need for Family Participation in Patient Care: Yes (Comment) Care giver support system in place?: Yes (comment) Current home services: DME, Home PT, Home OT Criminal Activity/Legal Involvement Pertinent to Current Situation/Hospitalization: No - Comment as  needed  Activities of Daily Living   ADL Screening (condition at time of admission) Independently performs ADLs?: Yes (appropriate for developmental age) Is the patient deaf or have difficulty hearing?: No Does the patient have difficulty seeing, even when wearing glasses/contacts?: No Does the patient have difficulty concentrating, remembering, or making decisions?: No  Permission Sought/Granted Permission sought to share information with : Facility Industrial/product Designer granted to share information with : Yes, Verbal Permission Granted     Permission granted to share info w AGENCY: SNF's        Emotional Assessment Appearance:: Appears stated age Attitude/Demeanor/Rapport: Engaged Affect (typically observed): Accepting, Calm Orientation: : Oriented to Self, Oriented to Place, Oriented to  Time, Oriented to Situation Alcohol / Substance Use: Not Applicable Psych Involvement: No (comment)  Admission diagnosis:  AKI (acute kidney injury) [N17.9] Abdominal pain, unspecified abdominal location [R10.9] Patient Active Problem List   Diagnosis Date Noted   History of CAD (coronary artery disease) 06/23/2024   AKI (acute kidney injury) 06/22/2024   Essential hypertension 06/22/2024   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   Abdominal pain 06/22/2024   Adjustment disorder with depressed mood 06/10/2024   Polyp of colon 06/08/2024   Ischemic colitis 06/05/2024   Right hip pain 05/11/2024   Pain due to onychomycosis of toenails of both feet 05/11/2024   Right leg pain 03/24/2024   Achilles tendinitis 03/18/2024   Moderate episode of recurrent  major depressive disorder (HCC) 02/06/2024   Chronic rhinitis 02/06/2024   Insomnia 02/06/2024   History of CVA (cerebrovascular accident) 10/23/2023   Left ankle pain 10/23/2023   Left-sided weakness 10/19/2023   Severe pulmonary hypertension (HCC) 10/01/2023   Diffuse pain 08/03/2023   Cellulitis of left lower extremity 04/02/2023    Hyponatremia 04/01/2023   Cellulitis 04/01/2023   Lower extremity weakness 04/01/2023   Inability to access health care due to transportation insecurity 03/31/2023   Ceruminosis, right 09/07/2022   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/2022   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   Chronic renal failure (CRF), stage 3b (HCC) 03/21/2022   Ileus (HCC) 08/22/2021   Lymphedema 04/19/2021   Atherosclerosis of aorta 01/16/2021   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/16/2021   Spinal stenosis of lumbar region 01/16/2021   Chronic pain syndrome 08/25/2020   Hypertension associated with diabetes (HCC) 08/25/2020   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) 05/30/2020   Retinopathy 05/30/2020   Multinodular goiter 02/04/2020   Proteinuria 12/22/2019   Constipation 11/17/2019   Physical deconditioning 08/13/2019   Abdominal aortic atherosclerosis 07/24/2019   Obesity, Class III, BMI 40-49.9 (morbid obesity) (HCC) 06/09/2019   Acute renal failure superimposed on stage 3b chronic kidney disease (HCC) 02/06/2019   Round hole, unspecified eye 02/06/2019   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   Disorder of rotator cuff 06/24/2018   Abnormal gait 03/26/2018   Arthritis 03/26/2018   Thyroid  nodule 01/20/2018   Carotid artery stenosis 07/19/2016   PAD (peripheral artery disease) 07/19/2016   Chronic diastolic CHF (congestive heart failure) (HCC) 05/31/2016   Osteoarthritis 10/13/2015   Anxiety 09/13/2015   CKD (chronic kidney disease) stage 4, GFR 15-29 ml/min (HCC) 09/13/2015   Hyperlipidemia 08/26/2014   Uncontrolled type 2 diabetes mellitus with hyperglycemia, with long-term current use of insulin  (HCC) 04/08/2014   Diabetic polyneuropathy (HCC) 04/08/2014   Type 2 diabetes mellitus with other specified complication (HCC) 04/08/2014   Diabetic nephropathy associated  with type 2 diabetes mellitus (HCC) 04/08/2014   PCP:  Abbey Bruckner, MD Pharmacy:   [facility] [address] [place] [address] [place] [address] Phone: [phone] Fax: [phone]  [id number] [place], [place] - [address] [place] [place] [address] Phone: [phone] Fax: [phone]     Social Drivers of Health (SDOH) Social History: SDOH Screenings   Food Insecurity: No Food Insecurity (06/23/2024)  Housing: Low Risk (06/23/2024)  Transportation Needs: No Transportation Needs (06/23/2024)  Utilities: Not At Risk (06/23/2024)  Alcohol Screen: Low Risk (11/29/2022)  Depression (PHQ2-9): High Risk (05/19/2024)  Financial Resource Strain: Medium Risk (10/03/2023)  Physical Activity: Inactive (10/03/2023)  Social Connections: Moderately Integrated (06/23/2024)  Stress: Stress Concern Present (11/29/2022)  Tobacco Use: Low Risk (06/08/2024)   SDOH Interventions:     Readmission Risk Interventions    06/25/2024   11:08 AM 10/24/2023    1:12 PM 10/03/2023   12:03 PM  Readmission Risk Prevention Plan  Transportation Screening Complete Complete Complete  PCP or Specialist Appt within 3-5 Days   Complete  HRI or Home Care Consult   Complete  Social Work Consult for Recovery Care Planning/Counseling   Complete  Palliative Care Screening   Not Applicable  Medication Review Oceanographer) Complete Complete Complete  PCP or Specialist appointment within 3-5 days of discharge Complete    HRI or Home Care  Consult Complete    SW Recovery Care/Counseling Consult Complete    Palliative Care Screening Not Applicable Not Applicable   Skilled Nursing Facility Not Complete Complete   SNF Comments PT eval pending.

## 2024-06-25 NOTE — Progress Notes (Signed)
 " Progress Note   Patient: Alexis Farmer FMW:[id number] DOB: Dec 02, 1940 DOA: 06/22/2024     2 DOS: the patient was seen and examined on 06/25/2024   Brief hospital course: Partly taken from prior notes.  Alexis Farmer is a pleasant 84 y.o. female with medical history significant for HTN, DM, CKD, dCHF, severe PAH, PAD, CVA, MGUS, morbid obesity, recent history of ischemic colitis s/p colonoscopy who came into ED complaining of abdominal pain and vomiting for 2 days.  Patient was recently admitted and discharged from the hospital between 06/05/2024 and 06/06/2024 for colitis, cystitis, AKI and right leg weakness.  She underwent colonoscopy on 12/22 which showed single linear ulceration of the [facility] flexure which may represent a small area of ischemia, rest of the colon showed diverticulosis without any signs of bleeding or formation.  AKI was improved after IV fluid.   On presentation vital stable, labs with creatinine of 1.92, leukocytosis at 11.2, blood sugar 361.  CXR with left basilar atelectasis.CT abdomen and pelvis showed disproportionate dilation of colon measuring up to 10.5 cm at the level of cecum. There is a single 2 mm size focus of air within the left hepatic lobe.   GI was consulted by EDP, they advised trial colonic decompression by placing rectal tube.  patient was also started on Cipro  and Flagyl .  1/6: Vitals mostly stable, worsening leukocytosis at 15.1, hemoglobin decreased to 10.3, potassium at 3.2, worsening creatinine now at 2.39.  Renal ultrasound with increased cortical echogenicity of right kidney consistent with CKD.  No hydronephrosis.  Repeat KUB with moderate but improved gaseous distention of colon consistent with ileus or functional colonic disturbance. GI would like to keep rectal tube, patient keep asking to remove it as it was causing discomfort.  Unable to urinate with positive bladder scan volume above 400.  Refusing catheterization.  Patient was refusing most  of the care and keeps saying that she knows better about herself.  She was keep asking to remove rectal tube and let her go.  Family would like to continue management. Ordered UA with reflex to culture if needed.  1/7: Vital stable, improving renal function as she started voiding.  Had a bowel movement, repeat KUB with resolution of colonic distention so rectal tube to be removed.  He was started on clear liquid diet and can be advance as tolerated. Hypophosphatemia with phosphorus at 1.8 which is being repleted. UA with protein urea only, not consistent with UTI  1/8: Vital stable, leukocytosis resolved, improving renal function with creatinine now at 1.19, tolerating diet and having bowel movement.  hypophosphatemia improved to 2.4-giving some more phosphorous. PT is recommending SNF, TOC was consulted  Assessment and Plan: * AKI (acute kidney injury) History of CKD stage IIIb Worsening renal function and patient is unable to void stating it is causing pain. Renal ultrasound was negative for hydronephrosis, did show medical renal disease with history of underlying CKD stage IIIb. Renal function continue to improve with creatinine of 1.19 today which is better than her baseline -Monitor renal function -Encourage p.o. hydration -Avoid nephrotoxins  Abdominal pain Concern of ileus/dysfunctional colon with history of ischemic colitis/Ogilvie's syndrome. GI is on board and a rectal tube was placed for decompression, KUB on 1/7 with resolution of colonic dilatation.  Rectal tube was removed. Patient is tolerating diet, had a bowel movement -Continue with Cipro  and Flagyl  -Continue with supportive care and IV fluid -Try avoiding opiate  Chronic diastolic CHF (congestive heart failure) (HCC) Clinically appears euvolemic. -Keep  holding diuretic -Monitor volume status   Uncontrolled type 2 diabetes mellitus with hyperglycemia, with long-term current use of insulin  (HCC) CBC mildly  elevated. - Continue Lantus  -Continue with SSI - Add 3 units with meal  Essential hypertension Blood pressure was in low normal limit today. -Morning dose of home hydralazine  was held -Continue home Coreg   -Holding home torsemide , spironolactone  due to AKI  Hyperlipidemia - Continue home Crestor   History of CAD (coronary artery disease) No chest pain. -Holding home aspirin  and Plavix  until ileus resolved as she might have to go to the OR.   Subjective: Patient was complaining of some lower back and buttock pain when seen today.  She does not want to go on chair stating that sitting will hurt my buttocks.  Had a bowel movement and tolerating diet.  Intermittent crampy abdominal pain, still does not think that she is at baseline.  Physical Exam: Vitals:   06/24/24 1958 06/24/24 2148 06/25/24 0500 06/25/24 0720  BP: 122/74 122/74 (!) 150/59 109/66  Pulse: 74  69 64  Resp: 18  16 16   Temp: 98.7 F (37.1 C)  98.9 F (37.2 C) 98.6 F (37 C)  TempSrc:   Oral Oral  SpO2: 98%  98% 99%  Weight:      Height:       General.  Obese and frail elderly lady, in no acute distress. Pulmonary.  Lungs clear bilaterally, normal respiratory effort. CV.  Regular rate and rhythm, no JVD, rub or murmur. Abdomen.  Soft, nontender, nondistended, BS positive. CNS.  Alert and oriented .  No focal neurologic deficit. Extremities.  No edema, pulses intact and symmetrical. Psychiatry.  Judgment and insight appears normal.   Data Reviewed: Prior data reviewed  Family Communication: Talked with daughter on phone.  Disposition: Status is: Inpatient Remains inpatient appropriate because: Severity of illness  Planned Discharge Destination: SNF  DVT prophylaxis.  Lovenox  Time spent: 45 minutes  This record has been created using Conservation officer, historic buildings. Errors have been sought and corrected,but may not always be located. Such creation errors do not reflect on the standard of care.    Author: Amaryllis Dare, MD 06/25/2024 2:20 PM  For on call review [email or web address].  "

## 2024-06-25 NOTE — Evaluation (Signed)
 Physical Therapy Evaluation Patient Details Name: Alexis Farmer MRN: [id number] DOB: July 01, 1940 Today's Date: 06/25/2024  History of Present Illness  Pt is a pleasant 84 y.o. female with medical history significant for HTN, DM, CKD, dCHF, severe PAH, PAD, CVA, MGUS, morbid obesity, recent history of ischemic colitis s/p colonoscopy who came into ED complaining of abdominal pain and vomiting. MD assessment includes: AKI, abdominal pain, concern of ileus/dysfunctional colon with history of ischemic colitis/Ogilvie's syndrome, and uncontrolled DM.   Clinical Impression  Pt was pleasant and motivated to participate during the session and put forth good effort throughout. Pt required mostly +2 assist with functional tasks per below.  Pt required cuing for general sequencing including increased trunk flexion and hand placement during transfer training with pt able to come to standing with assist but with heavy lean on the RW for support and trunk in flexed position and was unable to advance either LE while in standing.  Pt will benefit from continued PT services upon discharge to safely address deficits listed in patient problem list for decreased caregiver assistance and eventual return to PLOF.           If plan is discharge home, recommend the following: Two people to help with walking and/or transfers;A lot of help with bathing/dressing/bathroom;Direct supervision/assist for medications management;Assist for transportation   Can travel by private vehicle   No    Equipment Recommendations Other (comment) (TBD at next venue of care)  Recommendations for Other Services       Functional Status Assessment Patient has had a recent decline in their functional status and/or demonstrates limited ability to make significant improvements in function in a reasonable and predictable amount of time     Precautions / Restrictions Precautions Precautions: Fall Restrictions Weight Bearing Restrictions  Per Provider Order: No      Mobility  Bed Mobility Overal bed mobility: Needs Assistance Bed Mobility: Rolling, Sidelying to Sit, Sit to Supine Rolling: Mod assist, Used rails   Supine to sit: +2 for physical assistance, Mod assist, Used rails Sit to supine: +2 for physical assistance, Max assist   General bed mobility comments: Heavy assist with all bed mobility tasks with cues for sequencing    Transfers Overall transfer level: Needs assistance Equipment used: Rolling walker (2 wheels) Transfers: Sit to/from Stand Sit to Stand: Mod assist, +2 physical assistance           General transfer comment: Mod cues for increased trunk flex and +2 mod A to come to standing    Ambulation/Gait               General Gait Details: Pt unable to advance either LE in standing  Stairs            Wheelchair Mobility     Tilt Bed    Modified Rankin (Stroke Patients Only)       Balance Overall balance assessment: Needs assistance Sitting-balance support: Feet supported Sitting balance-Leahy Scale: Good     Standing balance support: Bilateral upper extremity supported, During functional activity, Reliant on assistive device for balance Standing balance-Leahy Scale: Fair Standing balance comment: Heavy lean on the RW for support in standing                             Pertinent Vitals/Pain Pain Assessment Pain Assessment: 0-10 Pain Score: 8  Pain Location: RLE/hip and bottom Pain Descriptors / Indicators: Sore, Aching Pain Intervention(s): Patient requesting pain  meds-RN notified, Premedicated before session, Monitored during session, Repositioned    Home Living Family/patient expects to be discharged to:: Private residence Living Arrangements: Other relatives;Children Available Help at Discharge: Family Type of Home: House Home Access: Level entry       Home Layout: One level Home Equipment: Agricultural Consultant (2 wheels);Rollator (4  wheels);BSC/3in1;Wheelchair - manual;Lift chair Additional Comments: Lives with dtr and granddtr    Prior Function Prior Level of Function : Needs assist             Mobility Comments: Pt utilizes w/c for mobility and able to perform SPT's without assist at baseline; pt reported not being able to ambulate since around Feb of 2025 ADLs Comments: granddaughter has been assisting with sponge bathing and other ADLs as able, pt was MOD I/SUPv prior to ~1-2 months ago     Extremity/Trunk Assessment   Upper Extremity Assessment Upper Extremity Assessment: Generalized weakness    Lower Extremity Assessment Lower Extremity Assessment: Generalized weakness       Communication   Communication Communication: No apparent difficulties    Cognition Arousal: Alert Behavior During Therapy: WFL for tasks assessed/performed   PT - Cognitive impairments: No apparent impairments                         Following commands: Intact       Cueing Cueing Techniques: Verbal cues, Tactile cues     General Comments      Exercises     Assessment/Plan    PT Assessment Patient needs continued PT services  PT Problem List Decreased strength;Decreased activity tolerance;Decreased balance;Decreased mobility;Decreased safety awareness       PT Treatment Interventions DME instruction;Gait training;Functional mobility training;Therapeutic activities;Therapeutic exercise;Balance training;Neuromuscular re-education;Patient/family education    PT Goals (Current goals can be found in the Care Plan section)  Acute Rehab PT Goals Patient Stated Goal: Improved strength PT Goal Formulation: With patient Time For Goal Achievement: 07/08/24 Potential to Achieve Goals: Fair    Frequency Min 1X/week     Co-evaluation               AM-PAC PT 6 Clicks Mobility  Outcome Measure Help needed turning from your back to your side while in a flat bed without using bedrails?: A Lot Help  needed moving from lying on your back to sitting on the side of a flat bed without using bedrails?: A Lot Help needed moving to and from a bed to a chair (including a wheelchair)?: Total Help needed standing up from a chair using your arms (e.g., wheelchair or bedside chair)?: A Lot Help needed to walk in hospital room?: Total Help needed climbing 3-5 steps with a railing? : Total 6 Click Score: 9    End of Session Equipment Utilized During Treatment: Gait belt Activity Tolerance: Patient tolerated treatment well Patient left: in bed;with call bell/phone within reach;with nursing/sitter in room;with bed alarm set;Other (comment) (left in L sidelying for pressure relief, nursing in room at end of session and aware) Nurse Communication: Mobility status PT Visit Diagnosis: Muscle weakness (generalized) (M62.81);Difficulty in walking, not elsewhere classified (R26.2);Pain Pain - Right/Left: Right Pain - part of body: Leg;Hip    Time: 9051-8984 PT Time Calculation (min) (ACUTE ONLY): 27 min   Charges:   PT Evaluation $PT Eval Moderate Complexity: 1 Mod PT Treatments $Therapeutic Activity: 8-22 mins PT General Charges $$ ACUTE PT VISIT: 1 Visit    D. Glendia Bertin PT, DPT 06/25/2024, 11:47  AM

## 2024-06-25 NOTE — TOC Progression Note (Signed)
 Transition of Care [facility]) - Progression Note    Patient Details  Name: Alexis Farmer MRN: [id number] Date of Birth: [birth year]  [facility]) CM/SW Contact  Corean ONEIDA Haddock, RN Phone Number: 06/25/2024, 1:56 PM  Clinical Narrative:     Therapy has recommended SNF Bed search was initiated   Expected Discharge Plan: Skilled Nursing Facility Barriers to Discharge: Continued Medical Work up               Expected Discharge Plan and Services     Post Acute Care Choice: Skilled Nursing Facility Living arrangements for the past 2 months: Single Family Home                                       Social Drivers of Health (SDOH) Interventions SDOH Screenings   Food Insecurity: No Food Insecurity (06/23/2024)  Housing: Low Risk (06/23/2024)  Transportation Needs: No Transportation Needs (06/23/2024)  Utilities: Not At Risk (06/23/2024)  Alcohol Screen: Low Risk (11/29/2022)  Depression (PHQ2-9): High Risk (05/19/2024)  Financial Resource Strain: Medium Risk (10/03/2023)  Physical Activity: Inactive (10/03/2023)  Social Connections: Moderately Integrated (06/23/2024)  Stress: Stress Concern Present (11/29/2022)  Tobacco Use: Low Risk (06/08/2024)    Readmission Risk Interventions    06/25/2024   11:08 AM 10/24/2023    1:12 PM 10/03/2023   12:03 PM  Readmission Risk Prevention Plan  Transportation Screening Complete Complete Complete  PCP or Specialist Appt within 3-5 Days   Complete  HRI or Home Care Consult   Complete  Social Work Consult for Recovery Care Planning/Counseling   Complete  Palliative Care Screening   Not Applicable  Medication Review Oceanographer) Complete Complete Complete  PCP or Specialist appointment within 3-5 days of discharge Complete    HRI or Home Care Consult Complete    SW Recovery Care/Counseling Consult Complete    Palliative Care Screening Not Applicable Not Applicable   Skilled Nursing Facility Not Complete Complete   SNF  Comments PT eval pending.

## 2024-06-25 NOTE — Plan of Care (Signed)
°  Problem: Clinical Measurements: °Goal: Will remain free from infection °Outcome: Progressing °Goal: Cardiovascular complication will be avoided °Outcome: Progressing °  °Problem: Coping: °Goal: Level of anxiety will decrease °Outcome: Progressing °  °Problem: Safety: °Goal: Ability to remain free from injury will improve °Outcome: Progressing °  °Problem: Skin Integrity: °Goal: Risk for impaired skin integrity will decrease °Outcome: Progressing °  °

## 2024-06-25 NOTE — Assessment & Plan Note (Addendum)
 CBC mildly elevated. - Continue Lantus  -Continue with SSI - Add 3 units with meal

## 2024-06-25 NOTE — Assessment & Plan Note (Signed)
 Blood pressure was in low normal limit today. -Morning dose of home hydralazine  was held -Continue home Coreg   -Holding home torsemide , spironolactone  due to AKI

## 2024-06-26 DIAGNOSIS — I10 Essential (primary) hypertension: Secondary | ICD-10-CM | POA: Diagnosis not present

## 2024-06-26 DIAGNOSIS — K559 Vascular disorder of intestine, unspecified: Secondary | ICD-10-CM | POA: Diagnosis not present

## 2024-06-26 DIAGNOSIS — E1165 Type 2 diabetes mellitus with hyperglycemia: Secondary | ICD-10-CM | POA: Diagnosis not present

## 2024-06-26 DIAGNOSIS — E785 Hyperlipidemia, unspecified: Secondary | ICD-10-CM | POA: Diagnosis not present

## 2024-06-26 DIAGNOSIS — Z794 Long term (current) use of insulin: Secondary | ICD-10-CM | POA: Diagnosis not present

## 2024-06-26 DIAGNOSIS — K567 Ileus, unspecified: Secondary | ICD-10-CM | POA: Diagnosis not present

## 2024-06-26 DIAGNOSIS — R109 Unspecified abdominal pain: Secondary | ICD-10-CM | POA: Diagnosis not present

## 2024-06-26 DIAGNOSIS — K5981 Ogilvie syndrome: Secondary | ICD-10-CM | POA: Diagnosis not present

## 2024-06-26 DIAGNOSIS — I5032 Chronic diastolic (congestive) heart failure: Secondary | ICD-10-CM | POA: Diagnosis not present

## 2024-06-26 DIAGNOSIS — Z8679 Personal history of other diseases of the circulatory system: Secondary | ICD-10-CM | POA: Diagnosis not present

## 2024-06-26 DIAGNOSIS — N179 Acute kidney failure, unspecified: Secondary | ICD-10-CM | POA: Diagnosis not present

## 2024-06-26 LAB — GLUCOSE, CAPILLARY
Glucose-Capillary: 160 mg/dL — ABNORMAL HIGH (ref 70–99)
Glucose-Capillary: 142 mg/dL — ABNORMAL HIGH (ref 70–99)

## 2024-06-26 MED ORDER — POLYETHYLENE GLYCOL 3350 17 G PO PACK: 17.0000 g | PACK | Freq: Every day | ORAL | Status: AC | PRN

## 2024-06-26 MED ORDER — CIPROFLOXACIN HCL 500 MG PO TABS: 500.0000 mg | ORAL_TABLET | Freq: Two times a day (BID) | ORAL | Status: AC

## 2024-06-26 NOTE — Plan of Care (Signed)

## 2024-06-26 NOTE — TOC Transition Note (Addendum)
 Transition of Care [facility]) - Discharge Note   Patient Details  Name: Alexis Farmer MRN: [id number] Date of Birth: 1941/06/02  Transition of Care [facility]) CM/SW Contact:  Alfonso Rummer, LCSW Phone Number: 06/26/2024, 2:00 PM   Clinical Narrative:     [facility]. No furhter toc needs identified. LCSW A Rummer called pt granddaughter and left message on voicemail   Final next level of care: Skilled Nursing Facility Barriers to Discharge: Barriers Resolved   Patient Goals and CMS Choice Patient states their goals for this hospitalization and ongoing recovery are:: return to baseline          Discharge Placement PASRR number recieved: 06/26/24              Patient to be transferred to facility by: [facility] Name of family member notified: Wanita Stare  [phone] Patient and family notified of of transfer: 06/26/24  Discharge Plan and Services Additional resources added to the After Visit Summary for       Post Acute Care Choice: Skilled Nursing Facility                               Social Drivers of Health (SDOH) Interventions SDOH Screenings   Food Insecurity: No Food Insecurity (06/23/2024)  Housing: Low Risk (06/23/2024)  Transportation Needs: No Transportation Needs (06/23/2024)  Utilities: Not At Risk (06/23/2024)  Alcohol Screen: Low Risk (11/29/2022)  Depression (PHQ2-9): High Risk (05/19/2024)  Financial Resource Strain: Medium Risk (10/03/2023)  Physical Activity: Inactive (10/03/2023)  Social Connections: Moderately Integrated (06/23/2024)  Stress: Stress Concern Present (11/29/2022)  Tobacco Use: Low Risk (06/08/2024)     Readmission Risk Interventions    06/25/2024   11:08 AM 10/24/2023    1:12 PM 10/03/2023   12:03 PM  Readmission Risk Prevention Plan  Transportation Screening Complete Complete Complete  PCP or Specialist Appt within 3-5 Days   Complete  HRI or Home Care Consult   Complete  Social  Work Consult for Recovery Care Planning/Counseling   Complete  Palliative Care Screening   Not Applicable  Medication Review Oceanographer) Complete Complete Complete  PCP or Specialist appointment within 3-5 days of discharge Complete    HRI or Home Care Consult Complete    SW Recovery Care/Counseling Consult Complete    Palliative Care Screening Not Applicable Not Applicable   Skilled Nursing Facility Not Complete Complete   SNF Comments PT eval pending.

## 2024-06-26 NOTE — Care Management Important Message (Signed)
 Important Message  Patient Details  Name: Alexis Farmer MRN: [id number] Date of Birth: 01-Jan-1941   Important Message Given:  Yes - Medicare IM     Alexis Farmer 06/26/2024, 3:16 PM

## 2024-06-26 NOTE — Discharge Summary (Signed)
 " Physician Discharge Summary   Patient: Alexis Farmer MRN: [id number] DOB: 1941/03/28  Admit date:     06/22/2024  Discharge date: 06/26/2024  Discharge Physician: Amaryllis Dare   PCP: Bair, Kalpana, MD   Recommendations at discharge:  Please obtain CBC and CMP on follow-up Please check blood pressure before giving hydralazine  as it fluctuates. Please avoid constipation, use bowel regimen vigilantly to avoid diarrhea also. Follow-up with primary care provider  Discharge Diagnoses: Principal Problem:   AKI (acute kidney injury) Active Problems:   Abdominal pain   Chronic diastolic CHF (congestive heart failure) (HCC)   Uncontrolled type 2 diabetes mellitus with hyperglycemia, with long-term current use of insulin  (HCC)   Essential hypertension   Hyperlipidemia   History of CAD (coronary artery disease)   Ileus (HCC)   Ischemic colitis   Ogilvie syndrome   Hospital Course: Partly taken from prior notes.  Alexis Farmer is a pleasant 84 y.o. female with medical history significant for HTN, DM, CKD, dCHF, severe PAH, PAD, CVA, MGUS, morbid obesity, recent history of ischemic colitis s/p colonoscopy who came into ED complaining of abdominal pain and vomiting for 2 days.  Patient was recently admitted and discharged from the hospital between 06/05/2024 and 06/06/2024 for colitis, cystitis, AKI and right leg weakness.  She underwent colonoscopy on 12/22 which showed single linear ulceration of the [facility] flexure which may represent a small area of ischemia, rest of the colon showed diverticulosis without any signs of bleeding or formation.  AKI was improved after IV fluid.   On presentation vital stable, labs with creatinine of 1.92, leukocytosis at 11.2, blood sugar 361.  CXR with left basilar atelectasis.CT abdomen and pelvis showed disproportionate dilation of colon measuring up to 10.5 cm at the level of cecum. There is a single 2 mm size focus of air within the left hepatic lobe.    GI was consulted by EDP, they advised trial colonic decompression by placing rectal tube.  patient was also started on Cipro  and Flagyl .  1/6: Vitals mostly stable, worsening leukocytosis at 15.1, hemoglobin decreased to 10.3, potassium at 3.2, worsening creatinine now at 2.39.  Renal ultrasound with increased cortical echogenicity of right kidney consistent with CKD.  No hydronephrosis.  Repeat KUB with moderate but improved gaseous distention of colon consistent with ileus or functional colonic disturbance. GI would like to keep rectal tube, patient keep asking to remove it as it was causing discomfort.  Unable to urinate with positive bladder scan volume above 400.  Refusing catheterization.  Patient was refusing most of the care and keeps saying that she knows better about herself.  She was keep asking to remove rectal tube and let her go.  Family would like to continue management. Ordered UA with reflex to culture if needed.  1/7: Vital stable, improving renal function as she started voiding.  Had a bowel movement, repeat KUB with resolution of colonic distention so rectal tube to be removed.  He was started on clear liquid diet and can be advance as tolerated. Hypophosphatemia with phosphorus at 1.8 which is being repleted. UA with protein urea only, not consistent with UTI  1/8: Vital stable, leukocytosis resolved, improving renal function with creatinine now at 1.19, tolerating diet and having bowel movement.  hypophosphatemia improved to 2.4-giving some more phosphorous. PT is recommending SNF, TOC was consulted  1/9: Patient remained hemodynamically stable.  Continue to have bowel movements.  She should avoid constipation and use bowel regimen as needed.  Tolerating diet.  Patient will resume her home medications.  Given 2 more days of Cipro  and is being discharged to SNF for rehab.  Patient need a close follow-up with her providers for further assistance.  Assessment and Plan: *  AKI (acute kidney injury) History of CKD stage IIIb Worsening renal function and patient is unable to void stating it is causing pain. Renal ultrasound was negative for hydronephrosis, did show medical renal disease with history of underlying CKD stage IIIb. Renal function continue to improve with creatinine of 1.19 today which is better than her baseline -Monitor renal function -Encourage p.o. hydration -Avoid nephrotoxins  Abdominal pain Concern of ileus/dysfunctional colon with history of ischemic colitis/Ogilvie's syndrome. GI is on board and a rectal tube was placed for decompression, KUB on 1/7 with resolution of colonic dilatation.  Rectal tube was removed. Patient is tolerating diet, had a bowel movement -Continue with Cipro  for 2 more days. -Try avoiding opiate  Chronic diastolic CHF (congestive heart failure) (HCC) Clinically appears euvolemic. - Will resume home medications on discharge  Uncontrolled type 2 diabetes mellitus with hyperglycemia, with long-term current use of insulin  (HCC) CBC mildly elevated. - Continue home Lantus , SSI can be added at facility if needed  Essential hypertension Blood pressure mildly elevated. - Continuing home meds  Hyperlipidemia - Continue home Crestor   History of CAD (coronary artery disease) No chest pain. - Resume home medications.  Aspirin  and Plavix  were initially held to see if she need to go to the OR.  Consultants: Gastroenterology.  Surgery. Procedures performed: None Disposition: Skilled nursing facility Diet recommendation:  Cardiac and Carb modified diet DISCHARGE MEDICATION: Allergies as of 06/26/2024       Reactions   Celexa  [citalopram ]    Diarrhea upset stomach   Jardiance  [empagliflozin ] Other (See Comments)   Reaction not recalled   Norvasc  [amlodipine ]    Leg edema   Tape Other (See Comments)   Leaves the skin raw if left on for a period of time- tolerates paper tape   Penicillin V Rash   Penicillin  V Potassium Rash   Penicillins Rash   Has patient had a PCN reaction causing immediate rash, facial/tongue/throat swelling, SOB or lightheadedness with hypotension: Yes Has patient had a PCN reaction causing severe rash involving mucus membranes or skin necrosis: No Has patient had a PCN reaction that required hospitalization No Has patient had a PCN reaction occurring within the last 10 years: Yes If all of the above answers are NO, then may proceed with Cephalosporin use.        Medication List     TAKE these medications    Accu-Chek Guide Test test strip Generic drug: glucose blood Use as instructed   acetaminophen  500 MG tablet Commonly known as: TYLENOL  Take 500 mg by mouth every 6 (six) hours as needed for mild pain or headache.   aspirin  EC 81 MG tablet Take 81 mg by mouth daily. Swallow whole.   blood glucose meter kit and supplies Kit Accu chek, Dx code E11.65, check 3 times daily   Blood Pressure Kit 1 Device by Does not apply route daily.   carvedilol  12.5 MG tablet Commonly known as: COREG  Take 1 tablet (12.5 mg total) by mouth 2 (two) times daily with a meal.   Centrum Silver 50+Women Tabs Take 1 tablet by mouth daily with breakfast.   ciprofloxacin  500 MG tablet Commonly known as: CIPRO  Take 1 tablet (500 mg total) by mouth 2 (two) times daily for 2 days.  clopidogrel  75 MG tablet Commonly known as: PLAVIX  Take 1 tablet (75 mg total) by mouth daily.   gabapentin  300 MG capsule Commonly known as: NEURONTIN  Take 1 capsule (300 mg total) by mouth at bedtime. Start taking on: July 11, 2024   hydrALAZINE  100 MG tablet Commonly known as: APRESOLINE  Take 1 tablet (100 mg total) by mouth 3 (three) times daily.   insulin  glargine 100 UNIT/ML injection Commonly known as: LANTUS  Inject 0.15 mLs (15 Units total) into the skin daily.   Insulin  Syringe-Needle U-100 30G X 1/2 0.5 ML Misc Use it to give insulin  or check blood glucose, 4 times a day  or as covered by the insurance   nystatin  powder Commonly known as: MYCOSTATIN /NYSTOP  Apply 1 Application topically 2 (two) times daily as needed.   polyethylene glycol 17 g packet Commonly known as: MIRALAX  / GLYCOLAX  Take 17 g by mouth daily as needed for mild constipation.   rosuvastatin  20 MG tablet Commonly known as: CRESTOR  TAKE 1 TABLET BY MOUTH EVERY DAY   senna-docusate 8.6-50 MG tablet Commonly known as: Senokot-S Take 1 tablet by mouth 2 (two) times daily.   spironolactone  25 MG tablet Commonly known as: ALDACTONE  Take 25 mg by mouth 3 (three) times a week. Monday Wednesday and friday   torsemide  20 MG tablet Commonly known as: DEMADEX  Take 1.5 tablets (30 mg total) by mouth daily.   VITAMIN C PO Take 1 tablet by mouth daily.        Follow-up Information     Bair, Kalpana, MD. Schedule an appointment as soon as possible for a visit in 1 week(s).   Specialty: Family Medicine Contact information: [address] [place] [address] [phone]                Discharge Exam: Alexis Farmer   06/22/24 0555  Weight: 102.6 kg   General.  Morbidly obese lady, in no acute distress. Pulmonary.  Lungs clear bilaterally, normal respiratory effort. CV.  Regular rate and rhythm, no JVD, rub or murmur. Abdomen.  Soft, nontender, nondistended, BS positive. CNS.  Alert and oriented .  No focal neurologic deficit. Extremities.  No edema,  pulses intact and symmetrical. Psychiatry.  Judgment and insight appears normal.   Condition at discharge: stable  The results of significant diagnostics from this hospitalization (including imaging, microbiology, ancillary and laboratory) are listed below for reference.   Imaging Studies: DG Abd 1 View Result Date: 06/24/2024 CLINICAL DATA:  Abdominal pain. EXAM: ABDOMEN - 1 VIEW COMPARISON:  06/23/2024 FINDINGS: Normal bowel-gas pattern with resolution of the previous gaseous distention of the colon. Lumbar and  lower thoracic spine degenerative changes. Mildly improved left lower lobe atelectasis. IMPRESSION: 1. Resolution of the previous gaseous distention of the colon. 2. Mildly improved left lower lobe atelectasis. Electronically Signed   By: Elspeth Bathe M.D.   On: 06/24/2024 15:02   US  RENAL Result Date: 06/23/2024 CLINICAL DATA:  Acute kidney injury. EXAM: RENAL / URINARY TRACT ULTRASOUND COMPLETE COMPARISON:  CT abdomen 06/05/2024 FINDINGS: Right Kidney: Renal measurements: 11.0 x 5.6 x 5.3 cm = volume: 170 mL. Increased cortical echogenicity. Small amount of perinephric fluid. No hydronephrosis or focal lesion. Left Kidney: Renal measurements: 11.6 x 5.9 x 4.6 cm = volume: 164 mL. Poorly visualized. No hydronephrosis or focal lesion. Bladder: Appears normal for degree of bladder distention. Other: None. IMPRESSION: Increased cortical echogenicity of the right kidney consistent with chronic kidney disease. No hydronephrosis. Small amount of right perinephric fluid. Electronically Signed  By: Marcey Moan M.D.   On: 06/23/2024 16:16   DG Abd 1 View Result Date: 06/23/2024 EXAM: 1 VIEW XRAY OF THE ABDOMEN 06/23/2024 08:25:00 AM COMPARISON: CT scan 06/22/2024. CLINICAL HISTORY: Ogilvie's syndrome. FINDINGS: BOWEL: Moderate but improved gaseous distention of the colon consistent with ileus or functional colonic disturbance. No pneumatosis or definite dilated small bowel. SOFT TISSUES: Aortic atheromatous vascular calcification. BONES: Degenerative changes of the lumbar spine. Mild degenerative arthropathy of both hips. Thoracolumbar spondylosis. No acute fracture. LUNGS: Left lower lobe retrocardiac airspace opacity could be from atelectasis or early pneumonia, and was not present on CT examination of 06/22/2024. PLEURAL SPACES: There is also mild blunting of the left lateral costophrenic angle which could reflect a small left pleural effusion. IMPRESSION: 1. Moderate but improved gaseous distention of the colon  consistent with ileus or functional colonic disturbance, without pneumatosis or definite dilated small bowel. 2. Left lower lobe retrocardiac airspace opacity that could reflect atelectasis or early pneumonia, with mild blunting of the left lateral costophrenic angle that could reflect a small left pleural effusion. 3. Degenerative changes of the lumbar spine with thoracolumbar spondylosis, mild degenerative arthropathy of both hips, and aortic atherosclerotic calcification. Electronically signed by: Ryan Salvage MD 06/23/2024 01:14 PM EST RP Workstation: [id number]   CT ABDOMEN PELVIS WO CONTRAST Result Date: 06/22/2024 CLINICAL DATA:  Abdominal pain, acute, nonlocalized Abdominal pain, post-op. EXAM: CT ABDOMEN AND PELVIS WITHOUT CONTRAST TECHNIQUE: Multidetector CT imaging of the abdomen and pelvis was performed following the standard protocol without IV contrast. RADIATION DOSE REDUCTION: This exam was performed according to the departmental dose-optimization program which includes automated exposure control, adjustment of the mA and/or kV according to patient size and/or use of iterative reconstruction technique. COMPARISON:  CT scan abdomen and pelvis from 06/05/2024. FINDINGS: Lower chest: There are patchy atelectatic changes in the visualized lung bases. No overt consolidation. No pleural effusion. The heart is normal in size. No pericardial effusion. Hepatobiliary: The liver is normal in size. Non-cirrhotic configuration. No suspicious mass. There is a single 2 mm sized focus of air within the left hepatic lobe, segment 4A. It is difficult to differentiate whether it is within the portal vein tributary or a bile duct, due to lack of intravenous contrast. No intrahepatic or extrahepatic bile duct dilation. Gallbladder is surgically absent. Pancreas: Unremarkable. No pancreatic ductal dilatation or surrounding inflammatory changes. Spleen: Within normal limits. No focal lesion. Adrenals/Urinary Tract:  Adrenal glands are unremarkable. No suspicious renal mass within the limitations of this unenhanced exam. No nephroureterolithiasis or obstructive uropathy. Bilateral vascular calcifications noted. Unremarkable urinary bladder. Stomach/Bowel: There is disproportionate dilation of the colon measuring up to 10.5 cm at the level of cecum. There is unformed liquid stool throughout the colon. No definite transition point seen. No focal obstructing mass seen. 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 No abnormal bowel wall thickening or inflammatory changes. There is small sliding hiatal hernia. There is fluid-filled and dilated visualized lower thoracic esophagus, which is nonspecific but most likely seen in the settings of chronic gastroesophageal reflux disease versus esophageal dysmotility. Appendix is not distinctly visualized. Small bowel loops are nondilated. There are scattered diverticula mainly in the left hemi colon, without imaging signs of diverticulitis. Vascular/Lymphatic: No ascites or pneumoperitoneum. No abdominal or pelvic lymphadenopathy, by size criteria. No aneurysmal dilation of the major abdominal arteries. There are marked peripheral atherosclerotic vascular calcifications of the aorta and its major branches. Reproductive: The uterus is unremarkable. No large adnexal mass. Other: There  is a tiny fat containing umbilical hernia. The soft tissues and abdominal wall are otherwise unremarkable. Musculoskeletal: No suspicious osseous lesions. There are moderate multilevel degenerative changes in the visualized spine. IMPRESSION: 1. There is disproportionate dilation of the colon measuring up to 10.5 cm at the level of cecum. There is unformed liquid stool throughout the colon. No definite transition point seen. No focal obstructing mass seen. 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 2. There is a single 2 mm sized focus of air within the left hepatic lobe,  segment 4A. It is difficult to differentiate whether it is within the portal vein tributary or a bile duct, due to lack of intravenous contrast. 3. Multiple other nonacute observations, as described above. Aortic Atherosclerosis (ICD10-I70.0). Electronically Signed   By: Ree Molt M.D.   On: 06/22/2024 09:00   DG Chest Portable 1 View Result Date: 06/22/2024 EXAM: 1 VIEW(S) XRAY OF THE CHEST 06/22/2024 07:46:00 AM COMPARISON: 09/29/2023 CLINICAL HISTORY: sob FINDINGS: LUNGS AND PLEURA: Low lung volumes accentuate pulmonary vasculature. Left basilar atelectasis. No pleural effusion. No pneumothorax. HEART AND MEDIASTINUM: Aortic atherosclerosis. Low lung volumes accentuate the cardiomediastinal silhouette. BONES AND SOFT TISSUES: No acute osseous abnormality. IMPRESSION: 1. Left basilar atelectasis accentuated by low lung volumes. Electronically signed by: Katheleen Faes MD 06/22/2024 08:09 AM EST RP Workstation: [id number]   CT FEMUR RIGHT WO CONTRAST Result Date: 06/06/2024 CLINICAL DATA:  Right lower extremity pain. Concern for stress fracture. EXAM: CT OF THE LOWER RIGHT EXTREMITY WITHOUT CONTRAST TECHNIQUE: Multidetector CT imaging of the right lower extremity was performed according to the standard protocol. RADIATION DOSE REDUCTION: This exam was performed according to the departmental dose-optimization program which includes automated exposure control, adjustment of the mA and/or kV according to patient size and/or use of iterative reconstruction technique. COMPARISON:  Right hip radiograph dated 06/05/2024. FINDINGS: Bones/Joint/Cartilage No acute fracture or dislocation. The bones are osteopenic. Moderate arthritic changes of the right hip and right knee. No joint effusion. Ligaments Suboptimally assessed by CT. Muscles and Tendons No acute findings.  Fatty atrophy of the quadriceps muscles. Soft tissues No acute findings. A 2.6 x 1.6 cm probable Baker's cyst in the posterior knee. IMPRESSION: 1.  No acute fracture or dislocation. 2. Moderate arthritic changes of the right hip and right knee. Electronically Signed   By: Vanetta Chou M.D.   On: 06/06/2024 10:07   US  Venous Img Lower Bilateral (DVT) Result Date: 06/06/2024 EXAM: ULTRASOUND DUPLEX OF THE BILATERAL LOWER EXTREMITY VEINS TECHNIQUE: Duplex ultrasound using B-mode/gray scaled imaging and Doppler spectral analysis and color flow was obtained of the deep venous structures of the bilateral lower extremity. COMPARISON: None available. CLINICAL HISTORY: Swelling. FINDINGS: LEFT: The common femoral vein, femoral vein, and popliteal vein demonstrate normal compressibility with normal color flow and spectral analysis. There is no evidence of deep venous thrombosis. The infrapopliteal veins are suboptimally demonstrated. RIGHT: The common femoral vein, femoral vein, and popliteal vein demonstrate normal compressibility with normal color flow and spectral analysis. There is no evidence of deep venous thrombosis. The infrapopliteal veins are suboptimally demonstrated. IMPRESSION: 1. No evidence of DVT in the visualized veins. 2. Infrapopliteal veins are suboptimally demonstrated bilaterally. Electronically signed by: Evalene Coho MD 06/06/2024 07:09 AM EST RP Workstation: [id number]   MR BRAIN WO CONTRAST Result Date: 06/06/2024 CLINICAL DATA:  Follow-up examination for stroke. EXAM: MRI HEAD WITHOUT CONTRAST TECHNIQUE: Multiplanar, multiecho pulse sequences of the brain and surrounding structures were obtained without intravenous contrast. COMPARISON:  CT from  earlier the same day. FINDINGS: Brain: Cerebral volume within normal limits. Patchy and confluent T2/FLAIR hyperintensity involving the periventricular and deep white matter of both cerebral hemispheres, consistent with chronic small vessel ischemic disease, moderate in nature. Encephalomalacia and gliosis involving the right occipital lobe, consistent with a chronic infarct. No abnormal  foci of restricted diffusion to suggest acute or subacute ischemia. Gray-white matter differentiation otherwise maintained. No acute or significant chronic intracranial blood products. No mass lesion, midline shift or mass effect. No hydrocephalus or extra-axial fluid collection. Pituitary gland within normal limits. Vascular: Major intracranial vascular flow voids are maintained. Skull and upper cervical spine: Craniocervical junction within normal limits. Bone marrow signal intensity normal. No scalp soft tissue abnormality. Sinuses/Orbits: Prior bilateral ocular lens replacement. Scattered mucosal thickening present throughout the sphenoid ethmoidal sinuses. No significant mastoid effusion. Other: None. IMPRESSION: 1. No acute intracranial abnormality. 2. Chronic right occipital infarct. 3. Underlying moderate chronic microvascular ischemic disease. Electronically Signed   By: Morene Hoard M.D.   On: 06/06/2024 00:02   DG HIP UNILAT WITH PELVIS MIN 4 VIEWS RIGHT Result Date: 06/05/2024 CLINICAL DATA:  Hip pain. EXAM: DG HIP (WITH OR WITHOUT PELVIS) 4+V RIGHT COMPARISON:  Reformats from abdominopelvic CT earlier today FINDINGS: Technically limited due to difficulty with positioning. No evidence of acute fracture of the pelvis or right hip. Moderate right hip osteoarthritis. The pubic rami are intact. No pubic symphyseal or sacroiliac diastasis. Vascular calcifications are seen. IMPRESSION: 1. No acute fracture of the pelvis or right hip. 2. Moderate right hip osteoarthritis. Electronically Signed   By: Andrea Gasman M.D.   On: 06/05/2024 20:53   CT ABDOMEN PELVIS WO CONTRAST Result Date: 06/05/2024 CLINICAL DATA:  Trauma pain hypotension EXAM: CT ABDOMEN AND PELVIS WITHOUT CONTRAST TECHNIQUE: Multidetector CT imaging of the abdomen and pelvis was performed following the standard protocol without IV contrast. RADIATION DOSE REDUCTION: This exam was performed according to the departmental  dose-optimization program which includes automated exposure control, adjustment of the mA and/or kV according to patient size and/or use of iterative reconstruction technique. COMPARISON:  CT 06/03/2024, 10/22/2022 FINDINGS: Lower chest: Lung bases demonstrate no acute airspace disease. Cardiomegaly with coronary vascular calcification. Hepatobiliary: No focal liver abnormality is seen. Status post cholecystectomy. No biliary dilatation. Pancreas: Unremarkable. No pancreatic ductal dilatation or surrounding inflammatory changes. Spleen: Normal in size without focal abnormality. Adrenals/Urinary Tract: Adrenal glands are normal. Kidneys show no hydronephrosis. Catheter in the bladder. Diffuse thick-walled appearance of bladder and some perivesical stranding. Small amount of gas in the urinary bladder. Stomach/Bowel: Stomach within normal limits. No dilated small bowel. Diffuse diverticular disease of the colon. Possible areas of wall thickening involving the hepatic flexure and proximal transverse colon. Persistent rectal wall thickening and mild perirectal/presacral edema. Vascular/Lymphatic: Aortic atherosclerosis. No enlarged abdominal or pelvic lymph nodes. Reproductive: Uterus and bilateral adnexa are unremarkable. Other: No ascites or free air. Small fat containing umbilical hernia Musculoskeletal: Multilevel degenerative changes of the spine. Grade 1 anterolisthesis L4 on L5. No acute osseous abnormality. IMPRESSION: 1. Diffuse diverticular disease of the colon. Possible areas of wall thickening involving the hepatic flexure and proximal transverse colon, correlate for colitis of infectious, inflammatory or ischemic etiology. Persistent rectal wall thickening and mild perirectal/presacral edema, suspicious for proctitis. 2. Diffuse thick-walled appearance of the bladder with perivesical stranding, correlate for cystitis. Aortic Atherosclerosis (ICD10-I70.0). Electronically Signed   By: Luke Bun M.D.   On:  06/05/2024 16:43   CT HEAD WO CONTRAST ( ) Result Date: 06/05/2024  CLINICAL DATA:  Head trauma EXAM: CT HEAD WITHOUT CONTRAST TECHNIQUE: Contiguous axial images were obtained from the base of the skull through the vertex without intravenous contrast. RADIATION DOSE REDUCTION: This exam was performed according to the departmental dose-optimization program which includes automated exposure control, adjustment of the mA and/or kV according to patient size and/or use of iterative reconstruction technique. COMPARISON:  CT brain 01/05/2024, MRI 10/19/2023 FINDINGS: Brain: No acute territorial infarction, hemorrhage or intracranial mass. Moderate white matter hypodensity consistent with chronic small vessel ischemic changes. The ventricles are nonenlarged. Vascular: No hyperdense vessels. Vertebral and carotid vascular calcification Skull: Normal. Negative for fracture or focal lesion. Sinuses/Orbits: Mild mucosal thickening in the sinuses Other: None IMPRESSION: 1. No CT evidence for acute intracranial abnormality. 2. Moderate chronic small vessel ischemic changes of the white matter. Electronically Signed   By: Luke Bun M.D.   On: 06/05/2024 16:35   CT Renal Stone Study Result Date: 06/03/2024 CLINICAL DATA:  Abdominal/flank pain.  Dysuria. EXAM: CT ABDOMEN AND PELVIS WITHOUT CONTRAST TECHNIQUE: Multidetector CT imaging of the abdomen and pelvis was performed following the standard protocol without IV contrast. RADIATION DOSE REDUCTION: This exam was performed according to the departmental dose-optimization program which includes automated exposure control, adjustment of the mA and/or kV according to patient size and/or use of iterative reconstruction technique. COMPARISON:  10/22/2022. FINDINGS: Lower chest: Minimal subpleural scarring in the lung bases. Heart is enlarged. No pericardial or pleural effusion. Distal esophagus is grossly unremarkable. Hepatobiliary: Liver is grossly unremarkable.  Cholecystectomy. No unexpected biliary ductal dilatation. Pancreas: Negative. Spleen: Negative. Adrenals/Urinary Tract: Adrenal glands and kidneys are unremarkable. No urinary stones. Ureters are decompressed. Foley catheter and air are seen in a decompressed bladder. Stomach/Bowel: Stomach is unremarkable. Incidentally noted duodenal diverticulum. Small bowel is otherwise unremarkable. Appendectomy. Moderate stool burden. Rectal thickening with mild perirectal inflammatory haziness and presacral edema. Vascular/Lymphatic: Atherosclerotic calcification of the aorta. No pathologically enlarged lymph nodes. Reproductive: Uterus is visualized.  No adnexal mass. Other: No free fluid. Mesenteries and peritoneum are otherwise unremarkable. Elevated left hemidiaphragm. Musculoskeletal: Degenerative changes in the spine. Minimal grade 1 anterolisthesis of L4 on L5. IMPRESSION: 1. Rectal wall thickening with perirectal haziness and presacral edema, findings indicative of proctitis. 2. Moderate stool burden. 3. No urinary stones or obstruction. 4.  Aortic atherosclerosis (ICD10-I70.0). Electronically Signed   By: Newell Eke M.D.   On: 06/03/2024 11:35    Microbiology: Results for orders placed or performed during the hospital encounter of 06/05/24  Gastrointestinal Panel by PCR , Stool     Status: None   Collection Time: 06/05/24 10:18 PM   Specimen: Stool  Result Value Ref Range Status   Campylobacter species NOT DETECTED NOT DETECTED Final   Plesimonas shigelloides NOT DETECTED NOT DETECTED Final   Salmonella species NOT DETECTED NOT DETECTED Final   Yersinia enterocolitica NOT DETECTED NOT DETECTED Final   Vibrio species NOT DETECTED NOT DETECTED Final   Vibrio cholerae NOT DETECTED NOT DETECTED Final   Enteroaggregative E coli (EAEC) NOT DETECTED NOT DETECTED Final   Enteropathogenic E coli (EPEC) NOT DETECTED NOT DETECTED Final   Enterotoxigenic E coli (ETEC) NOT DETECTED NOT DETECTED Final   Shiga  like toxin producing E coli (STEC) NOT DETECTED NOT DETECTED Final   Shigella/Enteroinvasive E coli (EIEC) NOT DETECTED NOT DETECTED Final   Cryptosporidium NOT DETECTED NOT DETECTED Final   Cyclospora cayetanensis NOT DETECTED NOT DETECTED Final   Entamoeba histolytica NOT DETECTED NOT DETECTED Final   Giardia  lamblia NOT DETECTED NOT DETECTED Final   Adenovirus F40/41 NOT DETECTED NOT DETECTED Final   Astrovirus NOT DETECTED NOT DETECTED Final   Norovirus GI/GII NOT DETECTED NOT DETECTED Final   Rotavirus A NOT DETECTED NOT DETECTED Final   Sapovirus (I, II, IV, and V) NOT DETECTED NOT DETECTED Final    Comment: Performed at [facility], [address] [place], [place] [address]  C Difficile Quick Screen w PCR reflex     Status: Abnormal   Collection Time: 06/05/24 10:18 PM   Specimen: Stool  Result Value Ref Range Status   C Diff antigen POSITIVE (A) NEGATIVE Final   C Diff toxin NEGATIVE NEGATIVE Final   C Diff interpretation Results are indeterminate. See PCR results.  Final    Comment: Performed at [facility], [address] [place], [place] [address]  C. Diff by PCR, Reflexed     Status: None   Collection Time: 06/05/24 10:18 PM  Result Value Ref Range Status   Toxigenic C. Difficile by PCR NEGATIVE NEGATIVE Final    Comment: Patient is colonized with non toxigenic C. difficile. May not need treatment unless significant symptoms are present.   Hypervirulent Strain PRESUMPTIVE NEGATIVE PRESUMPTIVE NEGATIVE Final    Comment: Performed at [facility], [address] [place], [place] [address]   *Note: Due to a large number of results and/or encounters for the requested time period, some results have not been displayed. A complete set of results can be found in Results Review.    Labs: CBC: Recent Labs  Lab 06/22/24 0605 06/23/24 0412 06/24/24 0516 06/25/24 0853  WBC 11.2* 15.1* 11.9* 10.2  NEUTROABS 8.0*  --   --   --   HGB 12.5 10.3*  10.0* 10.1*  HCT 38.6 32.1* 30.3* 31.1*  MCV 89.6 91.7 89.4 89.6  PLT 304 256 229 238   Basic Metabolic Panel: Recent Labs  Lab 06/22/24 0605 06/23/24 0412 06/24/24 0516 06/25/24 0853  NA 132* 135 135 138  K 3.5 3.2* 3.8 3.5  CL 90* 95* 101 104  CO2 23 26 23 27   GLUCOSE 361* 199* 234* 164*  BUN 25* 40* 33* 25*  CREATININE 1.92* 2.39* 1.54* 1.19*  CALCIUM  10.7* 9.2 8.9 8.8*  MG  --  1.8  --   --   PHOS  --   --  1.8* 2.4*   Liver Function Tests: Recent Labs  Lab 06/22/24 0605 06/23/24 0412 06/24/24 0516 06/25/24 0853  AST 31 21  --   --   ALT 22 16  --   --   ALKPHOS 56 46  --   --   BILITOT 1.0 1.1  --   --   PROT 8.1 6.1*  --   --   ALBUMIN 4.3 3.5 3.1* 3.0*   CBG: Recent Labs  Lab 06/25/24 1214 06/25/24 1850 06/25/24 2120 06/26/24 0747 06/26/24 1155  GLUCAP 214* 183* 161* 160* 142*    Discharge time spent: greater than 30 minutes.  This record has been created using Conservation officer, historic buildings. Errors have been sought and corrected,but may not always be located. Such creation errors do not reflect on the standard of care.   Signed: Amaryllis Dare, MD [facility] 06/26/2024 "

## 2024-06-26 NOTE — Progress Notes (Signed)
 Nursing report given to Ozell, LPN at [facility]. Patient awaiting transport to [facility] via [employer].

## 2024-06-26 NOTE — Progress Notes (Signed)
 Occupational Therapy Treatment Patient Details Name: Alexis Farmer MRN: [id number] DOB: 1940-11-25 Today's Date: 06/26/2024   History of present illness Pt is a pleasant 84 y.o. female with medical history significant for HTN, DM, CKD, dCHF, severe PAH, PAD, CVA, MGUS, morbid obesity, recent history of ischemic colitis s/p colonoscopy who came into ED complaining of abdominal pain and vomiting. MD assessment includes: AKI, abdominal pain, concern of ileus/dysfunctional colon with history of ischemic colitis/Ogilvie's syndrome, and uncontrolled DM.   OT comments  Upon entering the room, pt seated on EOB with visitor in room. Pt is agreeable to OT intervention after maximal encouragement as pt continues to be self limiting. Pt needing mod A of 2 to stand from elevated bed surface with RW for ~ 1 minute. Pt taking seated rest break. Pt stands once more but giving less effort and needing max A of 2 and manual facilitation for anterior weight shift. Pt stands for ~ 20-30 seconds for chux pad to be changed. Pt returning to sit on EOB per her request. Call bell and all needed items within reach.       If plan is discharge home, recommend the following:  Assistance with cooking/housework;Assist for transportation;Help with stairs or ramp for entrance;Two people to help with walking and/or transfers;Two people to help with bathing/dressing/bathroom   Equipment Recommendations  Other (comment) (defer to next venue)       Precautions / Restrictions Precautions Precautions: Fall Recall of Precautions/Restrictions: Impaired       Mobility Bed Mobility               General bed mobility comments: seated on EOB    Transfers Overall transfer level: Needs assistance Equipment used: Rolling walker (2 wheels) Transfers: Sit to/from Stand Sit to Stand: Mod assist, +2 physical assistance, Max assist, From elevated surface     Step pivot transfers: Min assist           Balance Overall  balance assessment: Needs assistance Sitting-balance support: Feet supported Sitting balance-Leahy Scale: Good     Standing balance support: Bilateral upper extremity supported, During functional activity, Reliant on assistive device for balance Standing balance-Leahy Scale: Fair                             ADL either performed or assessed with clinical judgement    Extremity/Trunk Assessment Upper Extremity Assessment Upper Extremity Assessment: Generalized weakness   Lower Extremity Assessment Lower Extremity Assessment: Generalized weakness        Vision Patient Visual Report: No change from baseline           Communication Communication Communication: Impaired Factors Affecting Communication: Hearing impaired   Cognition Arousal: Alert Behavior During Therapy: WFL for tasks assessed/performed Cognition: No apparent impairments                               Following commands: Intact        Cueing   Cueing Techniques: Verbal cues, Tactile cues             Pertinent Vitals/ Pain       Pain Assessment Pain Assessment: No/denies pain         Frequency  Min 2X/week        Progress Toward Goals  OT Goals(current goals can now be found in the care plan section)  Progress towards OT goals: Progressing toward goals  AM-PAC OT 6 Clicks Daily Activity     Outcome Measure   Help from another person eating meals?: A Little Help from another person taking care of personal grooming?: A Little Help from another person toileting, which includes using toliet, bedpan, or urinal?: A Lot Help from another person bathing (including washing, rinsing, drying)?: A Lot Help from another person to put on and taking off regular upper body clothing?: A Little Help from another person to put on and taking off regular lower body clothing?: A Lot 6 Click Score: 15    End of Session    OT Visit Diagnosis: Unsteadiness on feet  (R26.81);Repeated falls (R29.6);Muscle weakness (generalized) (M62.81)   Activity Tolerance Patient tolerated treatment well;Patient limited by pain   Patient Left in bed;with call bell/phone within reach;with bed alarm set   Nurse Communication Mobility status        Time: 8879-8858 OT Time Calculation (min): 21 min  Charges: OT General Charges $OT Visit: 1 Visit OT Treatments $Therapeutic Activity: 8-22 mins  Izetta Claude, MS, OTR/L , CBIS ascom [phone]  06/26/2024, 1:09 PM

## 2024-07-01 ENCOUNTER — Ambulatory Visit: Payer: Self-pay

## 2024-07-01 ENCOUNTER — Telehealth: Payer: Self-pay

## 2024-07-01 NOTE — Telephone Encounter (Signed)
 Patient was notified and aware of Dr Lula recommendations. Patient verbalized understanding and has no further questions at this time.

## 2024-07-01 NOTE — Telephone Encounter (Signed)
 FYI Only or Action Required?: Action required by provider: clinical question for provider and update on patient condition.  Patient was last seen in primary care on 05/11/2024 by Abbey Bruckner, MD.  Called Nurse Triage reporting Hip Pain.  Symptoms began several months ago.  Interventions attempted: Prescription medications: tylenol , Rest, hydration, or home remedies, and Other: currently in rehab for hip/leg pain.  Symptoms are: gradually worsening.  Triage Disposition: Go to ED Now (or PCP Triage) - in lieu of unanswered questions but severe pain  Patient/caregiver understands and will follow disposition?: No, refuses disposition        Copied from CRM #[id number]. Topic: Clinical - Red Word Triage >> Jul 01, 2024  8:10 AM Deaijah H wrote: Red Word that prompted transfer to Nurse Triage: Real bad pain from hip to leg. Requesting stronger medication cause tylenol  is not working. Worse at night, and currently real bad. Reason for Disposition  Patient sounds very sick or weak to the triager  Answer Assessment - Initial Assessment Questions Pt requesting stronger medication than tylenol  for hip/leg pain, also requesting strong laxatives. Pt refusing further triage, disposition, or care advice, this RN advised pt go to ED if truly severe pain for relief since no other questions answered to rule out other symptoms. Sending message to PCP office for call back to pt with further recommendations. Alerted CAL to ED refusal.        3. SEVERITY: How bad is the pain? What does it keep you from doing?   (Scale 1-10; or mild, moderate, severe)     Laying in bed in so much pain, trying to bear weight and do normal activities at rehab center but barely can do it with pain Tylenol  is only thing rehab can give her Sometimes in such heavy pain Worse at night  4. ONSET: When did the pain start? Does it come and go, or is it there all the time?     Been in pain since 9/15, in and out of  hospital, in rehab, not done appts yet this month Laying here now  8. OTHER SYMPTOMS: Do you have any other symptoms? (e.g., back pain, pain shooting down leg,  fever, rash)     Constipation - rehab not giving laxatives or stronger laxatives, usually need something strong This RN suspects depressive symptoms for pt - there's nothing you can do forget I called, alright?  Denies: Trouble with bladder Fever  Protocols used: Hip Pain-A-AH

## 2024-07-01 NOTE — Telephone Encounter (Signed)
 Sending to Doc of the Day Christophe) for review - Returned call to patient to follow up to gather more information on the situation. Patient states she is currently in a rehabilitation facility and she states she has been asking the nursing staff and requesting the doctor give her something to help with the pain she is currently experiencing. Patient was informed that she would have to be seen and evaluated before she can have any medication prescribed. Patient states she is asking if her PCP can prescribe something. Patient was informed that Dr Abbey is out of the office today. Patient is asking is there anything she can do or that we can send over for the facility to have them give her to help. Please advise.

## 2024-07-01 NOTE — Telephone Encounter (Signed)
 Damien, triage nurse, called to state patient called in severe pain.  Damien states she is sending a note with all the details to our clinical staff.  Damien states patient is in a rehab facility at this time.

## 2024-07-02 ENCOUNTER — Inpatient Hospital Stay: Admitting: Nurse Practitioner

## 2024-07-03 NOTE — Progress Notes (Signed)
 PT was admitted per their discharge summary--- C. difficile positive toxin, negative PCR, did not need treatment.

## 2024-07-13 ENCOUNTER — Ambulatory Visit

## 2024-07-16 ENCOUNTER — Inpatient Hospital Stay

## 2024-07-16 ENCOUNTER — Inpatient Hospital Stay: Admitting: Oncology

## 2024-07-17 ENCOUNTER — Telehealth: Payer: Self-pay

## 2024-07-17 NOTE — Telephone Encounter (Signed)
 Copied from CRM [id number]. Topic: General - Call Back - No Documentation >> Jul 17, 2024  2:25 PM China J wrote: Reason for CRM: Patient received a missed call from the clinic. I could not find any information on who called or what it was in regards to.  Please call the patient back for an update if it was of importance.

## 2024-07-22 NOTE — Telephone Encounter (Signed)
 Spoke patient to follow up with telephone encounter. Patient states she has not reached out to our office. It may have been an old call. Patient advised to give our office a call back if any concerns arise.

## 2024-08-06 ENCOUNTER — Ambulatory Visit

## 2024-08-10 ENCOUNTER — Ambulatory Visit: Admitting: Podiatry

## 2024-08-11 ENCOUNTER — Encounter
